# Patient Record
Sex: Female | Born: 1955
Health system: Southern US, Community
[De-identification: ages and names within clinical notes are randomized; demographics above are authoritative.]

## PROBLEM LIST (undated history)

## (undated) DIAGNOSIS — N186 End stage renal disease: Secondary | ICD-10-CM

## (undated) DIAGNOSIS — B1920 Unspecified viral hepatitis C without hepatic coma: Secondary | ICD-10-CM

## (undated) DIAGNOSIS — E119 Type 2 diabetes mellitus without complications: Secondary | ICD-10-CM

## (undated) DIAGNOSIS — I639 Cerebral infarction, unspecified: Secondary | ICD-10-CM

## (undated) DIAGNOSIS — Z992 Dependence on renal dialysis: Secondary | ICD-10-CM

## (undated) DIAGNOSIS — R0902 Hypoxemia: Secondary | ICD-10-CM

## (undated) DIAGNOSIS — I10 Essential (primary) hypertension: Secondary | ICD-10-CM

## (undated) DIAGNOSIS — F1910 Other psychoactive substance abuse, uncomplicated: Secondary | ICD-10-CM

## (undated) HISTORY — DX: Hypoxemia: R09.02

## (undated) HISTORY — DX: Other psychoactive substance abuse, uncomplicated: F19.10

---

## 1998-01-16 ENCOUNTER — Emergency Department (HOSPITAL_COMMUNITY): Admission: EM | Admit: 1998-01-16 | Discharge: 1998-01-16 | Payer: Self-pay | Admitting: Emergency Medicine

## 2000-02-10 ENCOUNTER — Emergency Department (HOSPITAL_COMMUNITY): Admission: EM | Admit: 2000-02-10 | Discharge: 2000-02-10 | Payer: Self-pay | Admitting: Internal Medicine

## 2005-11-30 ENCOUNTER — Emergency Department (HOSPITAL_COMMUNITY): Admission: EM | Admit: 2005-11-30 | Discharge: 2005-11-30 | Payer: Self-pay | Admitting: Emergency Medicine

## 2010-02-19 ENCOUNTER — Inpatient Hospital Stay (HOSPITAL_COMMUNITY): Admission: EM | Admit: 2010-02-19 | Discharge: 2010-02-22 | Payer: Self-pay | Admitting: Emergency Medicine

## 2010-02-19 LAB — CONVERTED CEMR LAB
HDL: 38 mg/dL
Triglycerides: 82 mg/dL

## 2010-02-20 LAB — CONVERTED CEMR LAB: TSH: 4.489 microintl units/mL

## 2010-02-21 LAB — CONVERTED CEMR LAB
BUN: 3 mg/dL
Calcium: 8.3 mg/dL
Glucose, Bld: 129 mg/dL
HIV: NONREACTIVE
MCHC: 32 g/dL
MCV: 74.8 fL
Platelets: 212 10*3/uL
Potassium: 3.5 meq/L
RBC: 4.33 M/uL

## 2010-02-28 ENCOUNTER — Emergency Department (HOSPITAL_COMMUNITY): Admission: EM | Admit: 2010-02-28 | Discharge: 2010-02-28 | Payer: Self-pay | Admitting: Emergency Medicine

## 2010-03-08 ENCOUNTER — Ambulatory Visit: Payer: Self-pay | Admitting: Nurse Practitioner

## 2010-03-08 DIAGNOSIS — J189 Pneumonia, unspecified organism: Secondary | ICD-10-CM

## 2010-03-08 DIAGNOSIS — N390 Urinary tract infection, site not specified: Secondary | ICD-10-CM

## 2010-03-08 DIAGNOSIS — F17200 Nicotine dependence, unspecified, uncomplicated: Secondary | ICD-10-CM

## 2010-03-08 DIAGNOSIS — R030 Elevated blood-pressure reading, without diagnosis of hypertension: Secondary | ICD-10-CM

## 2010-03-08 LAB — CONVERTED CEMR LAB
Bilirubin Urine: NEGATIVE
Nitrite: NEGATIVE
Specific Gravity, Urine: 1.015
pH: 5

## 2010-03-09 ENCOUNTER — Encounter (INDEPENDENT_AMBULATORY_CARE_PROVIDER_SITE_OTHER): Payer: Self-pay | Admitting: Nurse Practitioner

## 2010-10-12 NOTE — Assessment & Plan Note (Signed)
Summary: NEW - Establish care   Vital Signs:  Patient profile:   55 year old female Menstrual status:  postmenopausal Height:      60 inches Weight:      131.5 pounds BMI:     25.77 BSA:     1.56 O2 Sat:      100 % on Room air Temp:     98.0 degrees F oral Pulse rate:   56 / minute Pulse rhythm:   regular Resp:     16 per minute BP sitting:   140 / 91  (left arm) Cuff size:   regular  Vitals Entered By: Isla Pence (March 08, 2010 3:05 PM)  O2 Flow:  Room air CC: follow-up visit [place] Is Patient Diabetic? No Pain Assessment Patient in pain? no       Does patient need assistance? Functional Status Self care Ambulation Normal     Menstrual Status postmenopausal   CC:  follow-up visit [place].  History of Present Illness: Pt into the office the office to establish care. Previously seen at [facility] over 5 years ago. no PCP since that time  PRIMARY DISCHARGE DIAGNOSES:   1. Community her pneumonia - Levaquin given and pt has completed the supply  2. Pyelonephritis - right flank pain and fever along with leukocytosis.  U/A consistent with UTI.  urine cultures greww klebsiella.   For both the above pt was started on rocephin and zithromax     SECONDARY DISCHARGE DIAGNOSES:   1. Tobacco abuse - ongoing and pt was started on nicotine patch in the hospital  2. Polysubstance abuse - last cocaine use over 1 year ago but admits still to some marijuana use     PERTINENT LABORATORY DATA:   1. CBC on admission showed a WBC of 23.6.   2. HIV antibody was nonreactive.   3. Legionella antigen in the urine was negative.   4. Streptococcal pneumoniae urinary antigen was also negative.   5. Blood cultures are negative   6. TSH is 4.489.   7. LDL cholesterol is 75.   8. CBC on discharge shows a WBC of 7.4.   9. Chemistries on discharge show a BUN of 3 and a creatinine of 0.74.   10.Urine culture grew Klebsiella pneumoniae that is pansensitive   except to ampicillin.      PERTINENT RADIOLOGICAL STUDIES:   1. X-ray of the chest done on February 19, 2010, showed focal opacity in       the posterior aspect of the right lung base likely related to       pneumonia.   2. CT of the abdomen and pelvis showed focal airspace disease in the       right lower lobe compatible with pneumonia.   3. Renal ultrasound done on February 21, 2010, showed normal renal size       without hydronephrosis.   4. Ultrasound of the pelvis showed small to moderate amount of free       fluid, not definitely seen on the preceding CT.    Habits & Providers  Alcohol-Tobacco-Diet     Alcohol drinks/day: <1     Tobacco Status: current     Tobacco Counseling: to quit use of tobacco products     Cigarette Packs/Day: 3-4 cigs a day     Year Started: age 51  Exercise-Depression-Behavior     Drug Use: past-cocaine use - quit 2010  Allergies (verified): No Known Drug Allergies  Past History:  Past Surgical History: gallstone removed but gallbladder still present Exploratory laparotomy Tubal ligation  Family History: mother - diabetes, htn, cva (deceased)  Social History: 2 adult children tobacco - 5 cigs per day ETOH - beer occasionally  Drug - marijuana use  quit cocaine use in 2010Smoking Status:  current Packs/Day:  3-4 cigs a day Drug Use:  past-cocaine use - quit 2010  Review of Systems General:  Denies fever. CV:  Denies chest pain or discomfort. Resp:  Complains of cough; denies shortness of breath and wheezing; cough has improved but not as bad as while in the hospital. GI:  Denies abdominal pain, nausea, and vomiting.  Physical Exam  General:  alert.   Head:  thin,short hair Ears:  ear piercing(s) noted.   Lungs:  normal respiratory effort and normal breath sounds.   Heart:  normal rate and regular rhythm.   Msk:  up to the exam table Neurologic:  alert & oriented X3.   Skin:  color normal.   Psych:  Oriented X3.     Impression &  Recommendations:  Problem # 1:  PNEUMONIA (ICD-486) Dx in hospital  o2 sat 100% in office pt has finished levaquin as ordered will order repeat CXRAY as indicated by hospital d/c Orders: Pulse Oximetry (single measurment) (94760) CXR- 2view (CXR)  Problem # 2:  ELEVATED BLOOD PRESSURE WITHOUT DIAGNOSIS OF HYPERTENSION (ICD-796.2) slightly elevated today without previous history of htn will recheck on next visit advised pt to limit sodium intake  Problem # 3:  UTI (ICD-599.0) will recheck urine today Orders: UA Dipstick w/o Micro (manual) (81002) T-Culture, Urine [id number])  Problem # 4:  TOBACCO ABUSE (ICD-305.1) ongoing - advised pt to quit use  Patient Instructions: 1)  Your urine will be sent for culture to be sure infection has cleared. 2)  Get Chest X-ray to be sure pneumonia has resolved.  You do not needs an appointment, just take the order. 3)  Be sure that you get an eligibility appointment so that you can get an orange card to continue to be seen in this office 4)  Follow up in 6-8 weeks for a complete physical exam.   5)  PAP, mammogram appt, tetanus  Laboratory Results   Urine Tests  Date/Time Received: March 08, 2010 3:13 PM   Routine Urinalysis   Color: lt. yellow Appearance: Hazy Glucose: negative   (Normal Range: Negative) Bilirubin: negative   (Normal Range: Negative) Ketone: negative   (Normal Range: Negative) Spec. Gravity: 1.015   (Normal Range: 1.003-1.035) Blood: trace-intact   (Normal Range: Negative) pH: 5.0   (Normal Range: 5.0-8.0) Protein: negative   (Normal Range: Negative) Urobilinogen: 0.2   (Normal Range: 0-1) Nitrite: negative   (Normal Range: Negative) Leukocyte Esterace: small   (Normal Range: Negative)         CXR  Procedure date:  02/21/2010  Findings:      increase in right lower lobe pneumonia  Pelvic US  Procedure date:  02/21/2010  Findings:      small to moderate amount of free fluid, not definitely seen  on the preceeding CT  Renal US  Procedure date:  02/21/2010  Findings:      showed normal renal size without hydronephrosis  CT Abdomen/Pelvis  Procedure date:  02/21/2010  Findings:      focal airspace disease in the right lower lobe compatible with pneumonia   CXR  Procedure date:  02/21/2010  Findings:      increase  in right lower lobe pneumonia  Pelvic US  Procedure date:  02/21/2010  Findings:      small to moderate amount of free fluid, not definitely seen on the preceeding CT  Renal US  Procedure date:  02/21/2010  Findings:      showed normal renal size without hydronephrosis  CT Abdomen/Pelvis  Procedure date:  02/21/2010  Findings:      focal airspace disease in the right lower lobe compatible with pneumonia

## 2010-10-12 NOTE — Letter (Signed)
Summary: PT INFORMATION SHEET  PT INFORMATION SHEET   Imported By: Roland Earl 03/24/2010 15:22:46  _____________________________________________________________________  External Attachment:    Type:   Image     Comment:   External Document

## 2010-11-27 LAB — STREP PNEUMONIAE URINARY ANTIGEN: Strep Pneumo Urinary Antigen: NEGATIVE

## 2010-11-27 LAB — CBC
HCT: 32.4 % — ABNORMAL LOW (ref 36.0–46.0)
HCT: 32.8 % — ABNORMAL LOW (ref 36.0–46.0)
Hemoglobin: 10.5 g/dL — ABNORMAL LOW (ref 12.0–15.0)
Hemoglobin: 12.7 g/dL (ref 12.0–15.0)
MCHC: 32.1 g/dL (ref 30.0–36.0)
MCV: 73.8 fL — ABNORMAL LOW (ref 78.0–100.0)
MCV: 74.6 fL — ABNORMAL LOW (ref 78.0–100.0)
Platelets: 171 10*3/uL (ref 150–400)
Platelets: 212 10*3/uL (ref 150–400)
RDW: 15 % (ref 11.5–15.5)
RDW: 15.5 % (ref 11.5–15.5)
RDW: 15.8 % — ABNORMAL HIGH (ref 11.5–15.5)

## 2010-11-27 LAB — TSH: TSH: 4.489 u[IU]/mL (ref 0.350–4.500)

## 2010-11-27 LAB — BASIC METABOLIC PANEL
BUN: 3 mg/dL — ABNORMAL LOW (ref 6–23)
Calcium: 8.3 mg/dL — ABNORMAL LOW (ref 8.4–10.5)
Creatinine, Ser: 0.74 mg/dL (ref 0.4–1.2)
GFR calc non Af Amer: >60 mL/min (ref >60)
Glucose, Bld: 129 mg/dL — ABNORMAL HIGH (ref 70–99)

## 2010-11-27 LAB — COMPREHENSIVE METABOLIC PANEL
ALT: 51 U/L — ABNORMAL HIGH (ref 0–35)
AST: 23 U/L (ref 0–37)
Albumin: 2.1 g/dL — ABNORMAL LOW (ref 3.5–5.2)
Albumin: 2.8 g/dL — ABNORMAL LOW (ref 3.5–5.2)
BUN: 13 mg/dL (ref 6–23)
BUN: 6 mg/dL (ref 6–23)
CO2: 23 mEq/L (ref 19–32)
Calcium: 7.9 mg/dL — ABNORMAL LOW (ref 8.4–10.5)
Creatinine, Ser: 0.65 mg/dL (ref 0.4–1.2)
GFR calc Af Amer: >60 mL/min (ref >60)
GFR calc non Af Amer: >60 mL/min (ref >60)
Glucose, Bld: 126 mg/dL — ABNORMAL HIGH (ref 70–99)
Potassium: 3 mEq/L — ABNORMAL LOW (ref 3.5–5.1)
Sodium: 138 mEq/L (ref 135–145)
Total Bilirubin: 0.5 mg/dL (ref 0.3–1.2)
Total Bilirubin: 0.8 mg/dL (ref 0.3–1.2)
Total Protein: 6.4 g/dL (ref 6.0–8.3)

## 2010-11-27 LAB — DIFFERENTIAL
Basophils Absolute: 0 10*3/uL (ref 0.0–0.1)
Basophils Absolute: 0 10*3/uL (ref 0.0–0.1)
Basophils Relative: 0 % (ref 0–1)
Eosinophils Relative: 0 % (ref 0–5)
Eosinophils Relative: 1 % (ref 0–5)
Lymphocytes Relative: 16 % (ref 12–46)
Lymphocytes Relative: 6 % — ABNORMAL LOW (ref 12–46)
Lymphs Abs: 1.4 10*3/uL (ref 0.7–4.0)
Lymphs Abs: 1.8 10*3/uL (ref 0.7–4.0)
Monocytes Absolute: 1.2 10*3/uL — ABNORMAL HIGH (ref 0.1–1.0)
Monocytes Relative: 11 % (ref 3–12)
Monocytes Relative: 7 % (ref 3–12)
Neutrophils Relative %: 73 % (ref 43–77)
Neutrophils Relative %: 87 % — ABNORMAL HIGH (ref 43–77)

## 2010-11-27 LAB — URINE CULTURE: Colony Count: >=100000

## 2010-11-27 LAB — URINE MICROSCOPIC-ADD ON

## 2010-11-27 LAB — HEMOGLOBIN A1C
Hgb A1c MFr Bld: 5.9 % — ABNORMAL HIGH (ref <5.7)
Mean Plasma Glucose: 123 mg/dL — ABNORMAL HIGH (ref <117)

## 2010-11-27 LAB — CULTURE, BLOOD (ROUTINE X 2)
Culture: NO GROWTH
Culture: NO GROWTH

## 2010-11-27 LAB — LACTIC ACID, PLASMA: Lactic Acid, Venous: 1.2 mmol/L (ref 0.5–2.2)

## 2010-11-27 LAB — LIPID PANEL: Cholesterol: 129 mg/dL (ref 0–200)

## 2010-11-27 LAB — URINALYSIS, ROUTINE W REFLEX MICROSCOPIC
Bilirubin Urine: NEGATIVE
Glucose, UA: NEGATIVE mg/dL

## 2010-11-27 LAB — LEGIONELLA ANTIGEN, URINE: Legionella Antigen, Urine: NEGATIVE

## 2011-12-09 ENCOUNTER — Encounter (HOSPITAL_COMMUNITY): Payer: Self-pay | Admitting: *Deleted

## 2011-12-09 ENCOUNTER — Emergency Department (HOSPITAL_COMMUNITY)
Admission: EM | Admit: 2011-12-09 | Discharge: 2011-12-10 | Disposition: A | Discharge status: Home / Self Care | Payer: Self-pay | Attending: Emergency Medicine | Admitting: Emergency Medicine

## 2011-12-09 DIAGNOSIS — J111 Influenza due to unidentified influenza virus with other respiratory manifestations: Secondary | ICD-10-CM | POA: Insufficient documentation

## 2011-12-09 DIAGNOSIS — R000 Tachycardia, unspecified: Secondary | ICD-10-CM | POA: Insufficient documentation

## 2011-12-09 DIAGNOSIS — R059 Cough, unspecified: Secondary | ICD-10-CM | POA: Insufficient documentation

## 2011-12-09 DIAGNOSIS — R05 Cough: Secondary | ICD-10-CM | POA: Insufficient documentation

## 2011-12-09 DIAGNOSIS — R231 Pallor: Secondary | ICD-10-CM | POA: Insufficient documentation

## 2011-12-09 NOTE — ED Notes (Signed)
[id number] Expected date:12/09/11<BR> Expected time:10:58 PM<BR> Means of arrival:Ambulance<BR> Comments:<BR> Triage on arrival: PTAR 22 -- Flu Symptoms

## 2011-12-10 ENCOUNTER — Encounter (HOSPITAL_COMMUNITY): Payer: Self-pay | Admitting: *Deleted

## 2011-12-10 ENCOUNTER — Emergency Department (HOSPITAL_COMMUNITY): Payer: Self-pay

## 2011-12-10 MED ORDER — HYDROCOD POLST-CHLORPHEN POLST 10-8 MG/5ML PO LQCR: 5.0000 mL | Freq: Two times a day (BID) | ORAL | Status: DC

## 2011-12-10 MED ORDER — IBUPROFEN 600 MG PO TABS: 600.0000 mg | ORAL_TABLET | Freq: Four times a day (QID) | ORAL | Status: AC | PRN

## 2011-12-10 NOTE — ED Provider Notes (Signed)
Medical screening examination/treatment/procedure(s) were performed by non-physician practitioner and as supervising physician I was immediately available for consultation/collaboration.   Delora Fuel, MD [id number]

## 2011-12-10 NOTE — Discharge Instructions (Signed)
Your chest x-ray reveals no pneumonia.  You've been given a cough preparation to help your symptoms of coughing.  He can take over-the-counter ibuprofen, but she also been given a prescription for 600 mg strength to help with her discomfort

## 2011-12-10 NOTE — ED Provider Notes (Signed)
History     CSN: [id number]  Arrival date & time 12/09/11  2302   First MD Initiated Contact with Patient 12/10/11 0029      Chief Complaint  Patient presents with  . Influenza    family diagnosed with flu, she hasn't felt good for a week has been taking nyquill    (Consider location/radiation/quality/duration/timing/severity/associated sxs/prior treatment) HPI Comments: Patient with flulike symptoms for the past week, now with increasing cough, left lower chest pain, posteriorly, with coughing.  Her entire family has been diagnosed with flu.  She been taking over-the-counter TheraFlu and NyQuil for her symptoms  Patient is a 56 y.o. female presenting with flu symptoms. The history is provided by the patient.  Influenza This is a new problem. The current episode started in the past 7 days. The problem has been unchanged. Associated symptoms include chest pain, coughing and a fever. Pertinent negatives include no headaches. The symptoms are aggravated by coughing. She has tried NSAIDs for the symptoms. The treatment provided no relief.    History reviewed. No pertinent past medical history.  History reviewed. No pertinent past surgical history.  History reviewed. No pertinent family history.  History  Substance Use Topics  . Smoking status: Not on file  . Smokeless tobacco: Not on file  . Alcohol Use: Yes    OB History    Grav Para Term Preterm Abortions TAB SAB Ect Mult Living                  Review of Systems  Constitutional: Positive for fever.  Respiratory: Positive for cough. Negative for shortness of breath.   Cardiovascular: Positive for chest pain.  Neurological: Negative for dizziness and headaches.    Allergies  Review of patient's allergies indicates no known allergies.  Home Medications   Current Outpatient Rx  Name Route Sig Dispense Refill  . HYDROCOD POLST-CPM POLST ER 10-8 MG/5ML PO LQCR Oral Take 5 mLs by mouth every 12 (twelve) hours. 140 mL 0   . IBUPROFEN 600 MG PO TABS Oral Take 1 tablet (600 mg total) by mouth every 6 (six) hours as needed for pain. 30 tablet 0    BP 113/73  Pulse 102  Temp 99.9 F (37.7 C)  Resp 18  SpO2 99%  Physical Exam  Constitutional: She is oriented to person, place, and time. She appears well-developed and well-nourished.  Neck: Normal range of motion.  Cardiovascular: Tachycardia present.   Pulmonary/Chest: No respiratory distress. She has no wheezes. She has rales. She exhibits tenderness.  Abdominal: Soft. She exhibits no distension. There is no tenderness.  Musculoskeletal: Normal range of motion.  Neurological: She is alert and oriented to person, place, and time.  Skin: Skin is warm. There is pallor.    ED Course  Procedures (including critical care time)  Labs Reviewed - No data to display Dg Chest 2 View  12/10/2011  *RADIOLOGY REPORT*  Clinical Data: Cough, congestion and fever.  CHEST - 2 VIEW  Comparison: 02/28/2010.  Findings: The cardiac silhouette, mediastinal and hilar contours are within normal limits and stable.  The lungs are clear.  No pleural effusion.  The bony thorax is intact.  IMPRESSION: No acute cardiopulmonary findings.  Original Report Authenticated By: P. Kalman Jewels, M.D.     1. Influenza   2. Cough productive of clear sputum       MDM  Will xray to RO pneumonia        Garald Balding, NP 12/10/11 [id number]

## 2018-02-12 ENCOUNTER — Emergency Department (HOSPITAL_COMMUNITY): Payer: Medicaid Other

## 2018-02-12 ENCOUNTER — Encounter (HOSPITAL_COMMUNITY): Payer: Self-pay | Admitting: Emergency Medicine

## 2018-02-12 ENCOUNTER — Inpatient Hospital Stay (HOSPITAL_COMMUNITY)
Admission: EM | Admit: 2018-02-12 | Discharge: 2018-03-15 | DRG: 853 | Disposition: A | Discharge status: Home Health | Payer: Medicaid Other | Attending: Internal Medicine | Admitting: Internal Medicine

## 2018-02-12 DIAGNOSIS — R509 Fever, unspecified: Secondary | ICD-10-CM

## 2018-02-12 DIAGNOSIS — D631 Anemia in chronic kidney disease: Secondary | ICD-10-CM | POA: Diagnosis present

## 2018-02-12 DIAGNOSIS — Z992 Dependence on renal dialysis: Secondary | ICD-10-CM

## 2018-02-12 DIAGNOSIS — L7632 Postprocedural hematoma of skin and subcutaneous tissue following other procedure: Secondary | ICD-10-CM | POA: Diagnosis not present

## 2018-02-12 DIAGNOSIS — G9341 Metabolic encephalopathy: Secondary | ICD-10-CM | POA: Diagnosis not present

## 2018-02-12 DIAGNOSIS — F17210 Nicotine dependence, cigarettes, uncomplicated: Secondary | ICD-10-CM | POA: Diagnosis present

## 2018-02-12 DIAGNOSIS — A409 Streptococcal sepsis, unspecified: Secondary | ICD-10-CM | POA: Diagnosis present

## 2018-02-12 DIAGNOSIS — R6520 Severe sepsis without septic shock: Secondary | ICD-10-CM | POA: Diagnosis present

## 2018-02-12 DIAGNOSIS — N390 Urinary tract infection, site not specified: Secondary | ICD-10-CM | POA: Diagnosis present

## 2018-02-12 DIAGNOSIS — F1010 Alcohol abuse, uncomplicated: Secondary | ICD-10-CM | POA: Diagnosis present

## 2018-02-12 DIAGNOSIS — D509 Iron deficiency anemia, unspecified: Secondary | ICD-10-CM | POA: Diagnosis not present

## 2018-02-12 DIAGNOSIS — E11649 Type 2 diabetes mellitus with hypoglycemia without coma: Secondary | ICD-10-CM | POA: Diagnosis not present

## 2018-02-12 DIAGNOSIS — N184 Chronic kidney disease, stage 4 (severe): Secondary | ICD-10-CM | POA: Diagnosis not present

## 2018-02-12 DIAGNOSIS — J690 Pneumonitis due to inhalation of food and vomit: Secondary | ICD-10-CM | POA: Diagnosis present

## 2018-02-12 DIAGNOSIS — R0600 Dyspnea, unspecified: Secondary | ICD-10-CM | POA: Diagnosis not present

## 2018-02-12 DIAGNOSIS — J84116 Cryptogenic organizing pneumonia: Secondary | ICD-10-CM | POA: Diagnosis present

## 2018-02-12 DIAGNOSIS — E875 Hyperkalemia: Secondary | ICD-10-CM | POA: Diagnosis present

## 2018-02-12 DIAGNOSIS — N179 Acute kidney failure, unspecified: Secondary | ICD-10-CM | POA: Diagnosis present

## 2018-02-12 DIAGNOSIS — E1165 Type 2 diabetes mellitus with hyperglycemia: Secondary | ICD-10-CM | POA: Diagnosis present

## 2018-02-12 DIAGNOSIS — I160 Hypertensive urgency: Secondary | ICD-10-CM | POA: Diagnosis present

## 2018-02-12 DIAGNOSIS — I288 Other diseases of pulmonary vessels: Secondary | ICD-10-CM

## 2018-02-12 DIAGNOSIS — R030 Elevated blood-pressure reading, without diagnosis of hypertension: Secondary | ICD-10-CM | POA: Diagnosis not present

## 2018-02-12 DIAGNOSIS — J9601 Acute respiratory failure with hypoxia: Secondary | ICD-10-CM | POA: Diagnosis present

## 2018-02-12 DIAGNOSIS — R0902 Hypoxemia: Secondary | ICD-10-CM

## 2018-02-12 DIAGNOSIS — N019 Rapidly progressive nephritic syndrome with unspecified morphologic changes: Secondary | ICD-10-CM | POA: Diagnosis present

## 2018-02-12 DIAGNOSIS — T380X5A Adverse effect of glucocorticoids and synthetic analogues, initial encounter: Secondary | ICD-10-CM | POA: Diagnosis not present

## 2018-02-12 DIAGNOSIS — I776 Arteritis, unspecified: Secondary | ICD-10-CM | POA: Diagnosis not present

## 2018-02-12 DIAGNOSIS — E872 Acidosis: Secondary | ICD-10-CM | POA: Diagnosis present

## 2018-02-12 DIAGNOSIS — R042 Hemoptysis: Secondary | ICD-10-CM | POA: Diagnosis not present

## 2018-02-12 DIAGNOSIS — R5081 Fever presenting with conditions classified elsewhere: Secondary | ICD-10-CM | POA: Diagnosis not present

## 2018-02-12 DIAGNOSIS — D61818 Other pancytopenia: Secondary | ICD-10-CM | POA: Diagnosis not present

## 2018-02-12 DIAGNOSIS — N19 Unspecified kidney failure: Secondary | ICD-10-CM

## 2018-02-12 DIAGNOSIS — E1122 Type 2 diabetes mellitus with diabetic chronic kidney disease: Secondary | ICD-10-CM | POA: Diagnosis present

## 2018-02-12 DIAGNOSIS — R918 Other nonspecific abnormal finding of lung field: Secondary | ICD-10-CM

## 2018-02-12 DIAGNOSIS — J189 Pneumonia, unspecified organism: Secondary | ICD-10-CM | POA: Diagnosis not present

## 2018-02-12 DIAGNOSIS — B1920 Unspecified viral hepatitis C without hepatic coma: Secondary | ICD-10-CM | POA: Diagnosis present

## 2018-02-12 DIAGNOSIS — Z794 Long term (current) use of insulin: Secondary | ICD-10-CM

## 2018-02-12 DIAGNOSIS — E878 Other disorders of electrolyte and fluid balance, not elsewhere classified: Secondary | ICD-10-CM | POA: Diagnosis present

## 2018-02-12 DIAGNOSIS — J8489 Other specified interstitial pulmonary diseases: Secondary | ICD-10-CM | POA: Diagnosis not present

## 2018-02-12 DIAGNOSIS — F1410 Cocaine abuse, uncomplicated: Secondary | ICD-10-CM | POA: Diagnosis present

## 2018-02-12 DIAGNOSIS — R0489 Hemorrhage from other sites in respiratory passages: Secondary | ICD-10-CM

## 2018-02-12 DIAGNOSIS — N186 End stage renal disease: Secondary | ICD-10-CM | POA: Diagnosis present

## 2018-02-12 DIAGNOSIS — J9600 Acute respiratory failure, unspecified whether with hypoxia or hypercapnia: Secondary | ICD-10-CM

## 2018-02-12 DIAGNOSIS — Y832 Surgical operation with anastomosis, bypass or graft as the cause of abnormal reaction of the patient, or of later complication, without mention of misadventure at the time of the procedure: Secondary | ICD-10-CM | POA: Diagnosis not present

## 2018-02-12 DIAGNOSIS — R1013 Epigastric pain: Secondary | ICD-10-CM | POA: Diagnosis not present

## 2018-02-12 DIAGNOSIS — D649 Anemia, unspecified: Secondary | ICD-10-CM

## 2018-02-12 DIAGNOSIS — K2920 Alcoholic gastritis without bleeding: Secondary | ICD-10-CM | POA: Diagnosis present

## 2018-02-12 DIAGNOSIS — Z01810 Encounter for preprocedural cardiovascular examination: Secondary | ICD-10-CM | POA: Diagnosis not present

## 2018-02-12 DIAGNOSIS — Z978 Presence of other specified devices: Secondary | ICD-10-CM | POA: Diagnosis present

## 2018-02-12 DIAGNOSIS — I120 Hypertensive chronic kidney disease with stage 5 chronic kidney disease or end stage renal disease: Secondary | ICD-10-CM | POA: Diagnosis present

## 2018-02-12 LAB — COMPREHENSIVE METABOLIC PANEL
ALT: 11 U/L — ABNORMAL LOW (ref 14–54)
ANION GAP: 10 (ref 5–15)
AST: 21 U/L (ref 15–41)
Albumin: 2.3 g/dL — ABNORMAL LOW (ref 3.5–5.0)
Alkaline Phosphatase: 53 U/L (ref 38–126)
BUN: 61 mg/dL — ABNORMAL HIGH (ref 6–20)
CHLORIDE: 106 mmol/L (ref 101–111)
CO2: 19 mmol/L — AB (ref 22–32)
Calcium: 7.7 mg/dL — ABNORMAL LOW (ref 8.9–10.3)
Creatinine, Ser: 7.58 mg/dL — ABNORMAL HIGH (ref 0.44–1.00)
GFR calc non Af Amer: 5 mL/min — ABNORMAL LOW (ref >60)
GFR, EST AFRICAN AMERICAN: 6 mL/min — AB (ref >60)
Glucose, Bld: 134 mg/dL — ABNORMAL HIGH (ref 65–99)
POTASSIUM: 4.8 mmol/L (ref 3.5–5.1)
SODIUM: 135 mmol/L (ref 135–145)
Total Bilirubin: 0.9 mg/dL (ref 0.3–1.2)
Total Protein: 7.7 g/dL (ref 6.5–8.1)

## 2018-02-12 LAB — RAPID URINE DRUG SCREEN, HOSP PERFORMED
AMPHETAMINES: NOT DETECTED
BARBITURATES: NOT DETECTED
BENZODIAZEPINES: NOT DETECTED
Cocaine: POSITIVE — AB
Opiates: NOT DETECTED
Tetrahydrocannabinol: NOT DETECTED

## 2018-02-12 LAB — CBC
HEMATOCRIT: 23.4 % — AB (ref 36.0–46.0)
Hemoglobin: 7.3 g/dL — ABNORMAL LOW (ref 12.0–15.0)
MCH: 22.3 pg — AB (ref 26.0–34.0)
MCHC: 31.2 g/dL (ref 30.0–36.0)
MCV: 71.6 fL — AB (ref 78.0–100.0)
Platelets: 306 10*3/uL (ref 150–400)
RBC: 3.27 MIL/uL — AB (ref 3.87–5.11)
RDW: 16.5 % — ABNORMAL HIGH (ref 11.5–15.5)
WBC: 12 10*3/uL — ABNORMAL HIGH (ref 4.0–10.5)

## 2018-02-12 LAB — URINALYSIS, ROUTINE W REFLEX MICROSCOPIC
Bilirubin Urine: NEGATIVE
Glucose, UA: NEGATIVE mg/dL
Ketones, ur: NEGATIVE mg/dL
Leukocytes, UA: NEGATIVE
Nitrite: NEGATIVE
Protein, ur: 100 mg/dL — AB
RBC / HPF: >50 RBC/hpf — ABNORMAL HIGH (ref 0–5)
SPECIFIC GRAVITY, URINE: 1.013 (ref 1.005–1.030)
pH: 5 (ref 5.0–8.0)

## 2018-02-12 LAB — ETHANOL

## 2018-02-12 LAB — PROTIME-INR
INR: 1.07
Prothrombin Time: 13.9 seconds (ref 11.4–15.2)

## 2018-02-12 LAB — LIPASE, BLOOD: LIPASE: 40 U/L (ref 11–51)

## 2018-02-12 LAB — POC OCCULT BLOOD, ED: Fecal Occult Bld: NEGATIVE

## 2018-02-12 LAB — ABO/RH: ABO/RH(D): A POS

## 2018-02-12 LAB — I-STAT CG4 LACTIC ACID, ED: LACTIC ACID, VENOUS: 0.76 mmol/L (ref 0.5–1.9)

## 2018-02-12 LAB — BRAIN NATRIURETIC PEPTIDE: B Natriuretic Peptide: 225.7 pg/mL — ABNORMAL HIGH (ref 0.0–100.0)

## 2018-02-12 LAB — TROPONIN I: Troponin I: <0.03 ng/mL (ref <0.03)

## 2018-02-12 LAB — ACETAMINOPHEN LEVEL: Acetaminophen (Tylenol), Serum: <10 ug/mL — ABNORMAL LOW (ref 10–30)

## 2018-02-12 MED ORDER — PANTOPRAZOLE SODIUM 40 MG IV SOLR
40.0000 mg | Freq: Once | INTRAVENOUS | Status: AC
  Administered 2018-02-12: 40 mg via INTRAVENOUS
  Filled 2018-02-12 (×2): qty 40

## 2018-02-12 MED ORDER — ONDANSETRON HCL 4 MG/2ML IJ SOLN
4.0000 mg | Freq: Once | INTRAMUSCULAR | Status: AC
  Administered 2018-02-12: 4 mg via INTRAVENOUS
  Filled 2018-02-12: qty 2

## 2018-02-12 MED ORDER — SODIUM CHLORIDE 0.9 % IV SOLN
2.0000 g | INTRAVENOUS | Status: DC
  Administered 2018-02-12: 2 g via INTRAVENOUS
  Filled 2018-02-12: qty 20

## 2018-02-12 MED ORDER — LORAZEPAM 2 MG/ML IJ SOLN
1.0000 mg | Freq: Four times a day (QID) | INTRAMUSCULAR | Status: AC | PRN
  Administered 2018-02-12 – 2018-02-15 (×3): 1 mg via INTRAVENOUS
  Filled 2018-02-12 (×3): qty 1

## 2018-02-12 MED ORDER — OXYCODONE-ACETAMINOPHEN 5-325 MG PO TABS
1.0000 | ORAL_TABLET | Freq: Once | ORAL | Status: DC
  Filled 2018-02-12: qty 1

## 2018-02-12 MED ORDER — SODIUM CHLORIDE 0.9 % IV BOLUS
500.0000 mL | Freq: Once | INTRAVENOUS | Status: AC
  Administered 2018-02-12: 500 mL via INTRAVENOUS

## 2018-02-12 MED ORDER — ONDANSETRON HCL 4 MG/2ML IJ SOLN
4.0000 mg | Freq: Four times a day (QID) | INTRAMUSCULAR | Status: DC | PRN
  Administered 2018-02-12 – 2018-03-03 (×3): 4 mg via INTRAVENOUS
  Filled 2018-02-12 (×3): qty 2

## 2018-02-12 MED ORDER — SODIUM CHLORIDE 0.9 % IV SOLN
INTRAVENOUS | Status: AC
  Administered 2018-02-12: via INTRAVENOUS

## 2018-02-12 MED ORDER — THIAMINE HCL 100 MG/ML IJ SOLN
100.0000 mg | Freq: Every day | INTRAMUSCULAR | Status: DC
  Administered 2018-02-12: 100 mg via INTRAVENOUS
  Filled 2018-02-12 (×2): qty 2

## 2018-02-12 MED ORDER — AZITHROMYCIN 500 MG IV SOLR
500.0000 mg | INTRAVENOUS | Status: DC
  Administered 2018-02-13: 500 mg via INTRAVENOUS
  Filled 2018-02-12: qty 500

## 2018-02-12 MED ORDER — ACETAMINOPHEN 325 MG PO TABS: 650.0000 mg | ORAL_TABLET | Freq: Four times a day (QID) | ORAL | Status: DC | PRN

## 2018-02-12 MED ORDER — HYDRALAZINE HCL 20 MG/ML IJ SOLN
10.0000 mg | INTRAMUSCULAR | Status: DC | PRN
  Administered 2018-02-15 – 2018-02-18 (×3): 10 mg via INTRAVENOUS
  Filled 2018-02-12 (×3): qty 1

## 2018-02-12 MED ORDER — ADULT MULTIVITAMIN W/MINERALS CH
1.0000 | ORAL_TABLET | Freq: Every day | ORAL | Status: DC
  Administered 2018-02-13 – 2018-03-14 (×26): 1 via ORAL
  Filled 2018-02-12 (×27): qty 1

## 2018-02-12 MED ORDER — LORAZEPAM 1 MG PO TABS: 1.0000 mg | ORAL_TABLET | Freq: Four times a day (QID) | ORAL | Status: AC | PRN

## 2018-02-12 MED ORDER — HYDROMORPHONE HCL 2 MG/ML IJ SOLN
1.0000 mg | INTRAMUSCULAR | Status: DC | PRN
  Administered 2018-02-12: 1 mg via INTRAVENOUS
  Filled 2018-02-12: qty 1

## 2018-02-12 MED ORDER — ONDANSETRON 4 MG PO TBDP
4.0000 mg | ORAL_TABLET | Freq: Once | ORAL | Status: AC | PRN
  Administered 2018-02-12: 4 mg via ORAL
  Filled 2018-02-12: qty 1

## 2018-02-12 MED ORDER — PANTOPRAZOLE SODIUM 40 MG PO TBEC
40.0000 mg | DELAYED_RELEASE_TABLET | Freq: Every day | ORAL | Status: DC
  Administered 2018-02-13: 40 mg via ORAL
  Filled 2018-02-12: qty 1

## 2018-02-12 MED ORDER — FOLIC ACID 1 MG PO TABS
1.0000 mg | ORAL_TABLET | Freq: Every day | ORAL | Status: DC
  Administered 2018-02-13 – 2018-02-16 (×4): 1 mg via ORAL
  Filled 2018-02-12 (×5): qty 1

## 2018-02-12 MED ORDER — SODIUM CHLORIDE 0.9% FLUSH
3.0000 mL | Freq: Two times a day (BID) | INTRAVENOUS | Status: DC
  Administered 2018-02-12 – 2018-03-14 (×52): 3 mL via INTRAVENOUS

## 2018-02-12 MED ORDER — HEPARIN SODIUM (PORCINE) 5000 UNIT/ML IJ SOLN
5000.0000 [IU] | Freq: Three times a day (TID) | INTRAMUSCULAR | Status: AC
  Administered 2018-02-12 – 2018-02-16 (×13): 5000 [IU] via SUBCUTANEOUS
  Filled 2018-02-12 (×13): qty 1

## 2018-02-12 MED ORDER — SENNOSIDES-DOCUSATE SODIUM 8.6-50 MG PO TABS
1.0000 | ORAL_TABLET | Freq: Every evening | ORAL | Status: DC | PRN
  Filled 2018-02-12: qty 1

## 2018-02-12 MED ORDER — LORAZEPAM 1 MG PO TABS
0.0000 mg | ORAL_TABLET | Freq: Two times a day (BID) | ORAL | Status: AC
  Administered 2018-02-14: 1 mg via ORAL
  Filled 2018-02-12: qty 1

## 2018-02-12 MED ORDER — LORAZEPAM 1 MG PO TABS
0.0000 mg | ORAL_TABLET | Freq: Four times a day (QID) | ORAL | Status: AC
  Administered 2018-02-13: 1 mg via ORAL
  Administered 2018-02-13: 2 mg via ORAL
  Filled 2018-02-12: qty 2
  Filled 2018-02-12: qty 1

## 2018-02-12 MED ORDER — ACETAMINOPHEN 650 MG RE SUPP: 650.0000 mg | Freq: Four times a day (QID) | RECTAL | Status: DC | PRN

## 2018-02-12 MED ORDER — SODIUM CHLORIDE 0.9 % IV SOLN
500.0000 mg | INTRAVENOUS | Status: DC
  Administered 2018-02-12: 500 mg via INTRAVENOUS
  Filled 2018-02-12: qty 500

## 2018-02-12 MED ORDER — HYDROMORPHONE HCL 2 MG/ML IJ SOLN
1.0000 mg | Freq: Once | INTRAMUSCULAR | Status: AC
  Administered 2018-02-12: 1 mg via INTRAVENOUS
  Filled 2018-02-12: qty 1

## 2018-02-12 MED ORDER — ONDANSETRON HCL 4 MG PO TABS
4.0000 mg | ORAL_TABLET | Freq: Four times a day (QID) | ORAL | Status: DC | PRN
  Administered 2018-03-04: 4 mg via ORAL
  Filled 2018-02-12: qty 1

## 2018-02-12 MED ORDER — HYDROCODONE-ACETAMINOPHEN 5-325 MG PO TABS
1.0000 | ORAL_TABLET | ORAL | Status: DC | PRN
  Administered 2018-02-14 – 2018-02-15 (×2): 2 via ORAL
  Administered 2018-02-16 – 2018-02-17 (×4): 1 via ORAL
  Administered 2018-02-18 (×2): 2 via ORAL
  Administered 2018-02-18 – 2018-03-06 (×6): 1 via ORAL
  Administered 2018-03-07 (×2): 2 via ORAL
  Administered 2018-03-15: 1 via ORAL
  Filled 2018-02-12: qty 2
  Filled 2018-02-12: qty 1
  Filled 2018-02-12 (×2): qty 2
  Filled 2018-02-12 (×5): qty 1
  Filled 2018-02-12: qty 2
  Filled 2018-02-12: qty 1
  Filled 2018-02-12: qty 2
  Filled 2018-02-12 (×3): qty 1
  Filled 2018-02-12: qty 2
  Filled 2018-02-12 (×3): qty 1
  Filled 2018-02-12 (×2): qty 2
  Filled 2018-02-12: qty 1

## 2018-02-12 MED ORDER — VITAMIN B-1 100 MG PO TABS
100.0000 mg | ORAL_TABLET | Freq: Every day | ORAL | Status: DC
  Administered 2018-02-13 – 2018-03-14 (×26): 100 mg via ORAL
  Filled 2018-02-12 (×26): qty 1

## 2018-02-12 MED ORDER — CEFTRIAXONE SODIUM 1 G IJ SOLR
1.0000 g | INTRAMUSCULAR | Status: AC
  Administered 2018-02-13 – 2018-02-18 (×6): 1 g via INTRAVENOUS
  Filled 2018-02-12 (×6): qty 10

## 2018-02-12 NOTE — ED Notes (Signed)
Radiology at bedside for CXR

## 2018-02-12 NOTE — ED Triage Notes (Signed)
Per EMS- from home c.o. Pain to abdomen for 1 week. Numerous liquor bottles noted in house. Nausea, with mucous production.

## 2018-02-12 NOTE — ED Provider Notes (Signed)
[facility] EMERGENCY DEPARTMENT Provider Note   CSN: [id number] Arrival date & time: 02/12/18  1522     History   Chief Complaint Chief Complaint  Patient presents with  . Abdominal Pain    HPI Rhonda Lynch is a 62 y.o. female.  HPI  63 year old female presents with a chief complaint of abdominal pain.  She states she is been having on and off epigastric abdominal pain for about 3 weeks.  She is also had vomiting, occasionally with small amounts of blood.  She has noticed some dark stools as well.  She drinks alcohol, she estimates about 3 times per week.  She denies alcohol use today.  The history is somewhat limited due to the patient's severe pain.  She has also been having cough and shortness of breath but denies chest pain.  She denies any fevers.  History reviewed. No pertinent past medical history.  Patient Active Problem List   Diagnosis Date Noted  . TOBACCO ABUSE 03/08/2010  . PNEUMONIA 03/08/2010  . UTI 03/08/2010  . ELEVATED BLOOD PRESSURE WITHOUT DIAGNOSIS OF HYPERTENSION 03/08/2010    No past surgical history on file.   OB History   None      Home Medications    Prior to Admission medications   Medication Sig Start Date End Date Taking? Authorizing Provider  chlorpheniramine-HYDROcodone (TUSSIONEX PENNKINETIC ER) 10-8 MG/5ML LQCR Take 5 mLs by mouth every 12 (twelve) hours. 12/10/11   Junius Creamer, NP    Family History No family history on file.  Social History Social History   Tobacco Use  . Smoking status: Not on file  Substance Use Topics  . Alcohol use: Yes  . Drug use: No     Allergies   Patient has no known allergies.   Review of Systems Review of Systems  Constitutional: Negative for fever.  Respiratory: Positive for cough and shortness of breath.   Cardiovascular: Negative for chest pain.  Gastrointestinal: Positive for abdominal pain and vomiting.  Neurological: Positive for headaches (on and off for 2-3  weeks).  All other systems reviewed and are negative.    Physical Exam Updated Vital Signs BP (!) 171/93 (BP Location: Right Arm)   Pulse 85   Temp 99.6 F (37.6 C) (Oral)   Resp 16   SpO2 95%   Physical Exam  Constitutional: She is oriented to person, place, and time. She appears well-developed and well-nourished. She appears distressed.  HENT:  Head: Normocephalic and atraumatic.  Right Ear: External ear normal.  Left Ear: External ear normal.  Nose: Nose normal.  Eyes: Right eye exhibits no discharge. Left eye exhibits no discharge.  Cardiovascular: Regular rhythm and normal heart sounds. Tachycardia present.  Pulmonary/Chest: No accessory muscle usage. Tachypnea noted.  Difficult lung auscultation given the tachypnea but also frequent cough when trying to inspire.  However she seems to have lung sounds bilaterally but they are coarse  Abdominal: Soft. There is tenderness in the epigastric area.  Genitourinary:  Genitourinary Comments: Light brown stool on rectal exam  Neurological: She is alert and oriented to person, place, and time.  Skin: Skin is warm and dry.  Nursing note and vitals reviewed.    ED Treatments / Results  Labs (all labs ordered are listed, but only abnormal results are displayed) Labs Reviewed  COMPREHENSIVE METABOLIC PANEL - Abnormal; Notable for the following components:      Result Value   CO2 19 (*)    Glucose, Bld 134 (*)  BUN 61 (*)    Creatinine, Ser 7.58 (*)    Calcium 7.7 (*)    Albumin 2.3 (*)    ALT 11 (*)    GFR calc non Af Amer 5 (*)    GFR calc Af Amer 6 (*)    All other components within normal limits  CBC - Abnormal; Notable for the following components:   WBC 12.0 (*)    RBC 3.27 (*)    Hemoglobin 7.3 (*)    HCT 23.4 (*)    MCV 71.6 (*)    MCH 22.3 (*)    RDW 16.5 (*)    All other components within normal limits  LIPASE, BLOOD  URINALYSIS, ROUTINE W REFLEX MICROSCOPIC  ETHANOL  ACETAMINOPHEN LEVEL  RAPID URINE  DRUG SCREEN, HOSP PERFORMED  PROTIME-INR  POC OCCULT BLOOD, ED  TYPE AND SCREEN    EKG EKG Interpretation  Date/Time:  Wednesday February 12 2018 15:34:39 EDT Ventricular Rate:  93 PR Interval:  120 QRS Duration: 66 QT Interval:  362 QTC Calculation: 450 R Axis:   -19 Text Interpretation:  Normal sinus rhythm Minimal voltage criteria for LVH, may be normal variant Borderline ECG No old tracing to compare Confirmed by Sherwood Gambler [phone]) on 02/12/2018 5:48:19 PM   Radiology Ct Abdomen Pelvis Wo Contrast  Result Date: 02/12/2018 CLINICAL DATA:  Epigastric abdominal pain for the past week. EXAM: CT ABDOMEN AND PELVIS WITHOUT CONTRAST TECHNIQUE: Multidetector CT imaging of the abdomen and pelvis was performed following the standard protocol without IV contrast. COMPARISON:  CT abdomen pelvis dated February 19, 2010. FINDINGS: Lower chest: Confluent ground-glass density at the right greater than left lung bases with inter and intralobular septal thickening, consistent with crazy paving pattern. This spares the subpleural lung. Hepatobiliary: No focal liver abnormality is seen. Status post cholecystectomy. Mild central intrahepatic and common bile duct dilatation, likely related to post cholecystectomy state. Pancreas: Unremarkable. No pancreatic ductal dilatation or surrounding inflammatory changes. Spleen: Normal in size without focal abnormality. Adrenals/Urinary Tract: Adrenal glands are unremarkable. Kidneys are normal, without renal calculi, focal lesion, or hydronephrosis. Bladder is unremarkable. Stomach/Bowel: Stomach is within normal limits. Appendix appears normal. No evidence of bowel wall thickening, distention, or inflammatory changes. Vascular/Lymphatic: Aortic atherosclerosis. No enlarged abdominal or pelvic lymph nodes. Reproductive: Prior hysterectomy. 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 Other: Tiny fat containing umbilical hernia. No free fluid or pneumoperitoneum. Musculoskeletal:  No acute or significant osseous findings. 3 mm anterolisthesis at L4-L5 due to severe facet arthropathy. Which IMPRESSION: 1.  No acute intra-abdominal process. 2. Confluent ground-glass density and inter and intralobular septal thickening at the right greater than left lung bases, consistent with crazy paving pattern. While this pattern is nonspecific, given the sparing of the subpleural lung, this may be related to cryptogenic organizing pneumonia. Pulmonology consultation is recommended. 3. 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 Electronically Signed   By: Titus Dubin M.D.   On: 02/12/2018 20:01   Dg Chest Port 1 View  Result Date: 02/12/2018 CLINICAL DATA:  Hypoxia. EXAM: PORTABLE CHEST 1 VIEW COMPARISON:  Chest x-ray dated December 10, 2011. FINDINGS: The heart size and mediastinal contours are within normal limits. Normal pulmonary vascularity. Confluent airspace disease involving the right central lung and left perihilar lung. No pleural effusion or pneumothorax. No acute osseous abnormality. IMPRESSION: 1. Confluent airspace disease in the right greater than left lungs. Electronically Signed   By: Titus Dubin M.D.   On: 02/12/2018 18:53  Procedures .Critical Care Performed by: Sherwood Gambler, MD Authorized by: Sherwood Gambler, MD   Critical care provider statement:    Critical care time (minutes):  40   Critical care time was exclusive of:  Separately billable procedures and treating other patients   Critical care was necessary to treat or prevent imminent or life-threatening deterioration of the following conditions:  Circulatory failure, respiratory failure, shock and sepsis   Critical care was time spent personally by me on the following activities:  Development of treatment plan with patient or surrogate, discussions with consultants, evaluation of patient's response to treatment, examination of patient, obtaining history from patient or surrogate, ordering and performing  treatments and interventions, ordering and review of laboratory studies, ordering and review of radiographic studies, pulse oximetry, re-evaluation of patient's condition and review of old charts   (including critical care time)  Medications Ordered in ED Medications  oxyCODONE-acetaminophen (PERCOCET/ROXICET) 5-325 MG per tablet 1 tablet (has no administration in time range)  pantoprazole (PROTONIX) injection 40 mg (has no administration in time range)  ondansetron (ZOFRAN) injection 4 mg (has no administration in time range)  HYDROmorphone (DILAUDID) injection 1 mg (has no administration in time range)  ondansetron (ZOFRAN-ODT) disintegrating tablet 4 mg (4 mg Oral Given 02/12/18 1556)     Initial Impression / Assessment and Plan / ED Course  I have reviewed the triage vital signs and the nursing notes.  Pertinent labs & imaging results that were available during my care of the patient were reviewed by me and considered in my medical decision making (see chart for details).     Patient presents with concern of abdominal pain but is noted to be quite hypoxic to 83% on room air.  This came up with supplemental oxygen.  Exam initially quite limited due to her pain but after IV Dilaudid she is resting much more comfortably and tachypnea is better.  She is hypertensive rather than hypotensive.  I do not think she is in shock but she has some concerning lab work including significant renal failure with a creatinine of 7 and anemia with a hemoglobin of 7.  No recent lab work within the last 8 years.  However I suspect this is at least acute or subacute.  I did do a bedside ultrasound which shows a collapsible IVC and so she will be given a little bit of fluids.  Her x-ray is concerning for multifocal pneumonia as well as her CT.  She will be given antibiotics.  Discussed with hospitalist service who will admit.  Final Clinical Impressions(s) / ED Diagnoses   Final diagnoses:  Hypoxia  Pneumonia  of both lungs due to infectious organism, unspecified part of lung  Acute kidney injury ([place])  Anemia, unspecified type    ED Discharge Orders    None       Sherwood Gambler, MD 02/13/18 0003

## 2018-02-12 NOTE — ED Provider Notes (Signed)
Patient placed in Quick Look pathway, seen and evaluated   Chief Complaint: Abdominal pain   HPI:   Rhonda Lynch is a 62 y.o. female who presents by EMS from home with a epigastric abdominal pain for 1 week. EMS reports the patient was writhing around in the bed. 190/148 BP automatic. Pain for the last week. Neighbor checked  A lot of liquor bottles everywhere. Hx of bronchitis. Intermittent nausea.   The patient is writhing around in the bed so history is limited.  The patient denies alcohol use today or yesterday.  She states " I had just a touch 2 days ago."  She denies a history of ICU admissions, seizures, or DTs.  ROS: Abdominal pain, nausea  Physical Exam:   Gen: No distress  Neuro: Awake and Alert  Skin: Warm    Focused Exam: Patient is uncomfortable appearing.  She is writhing around in the chair and holding her upper abdomen.  Tender to palpation in the epigastric region.  No CVA tenderness bilaterally.  Abdomen is soft, nondistended.  No peritoneal signs.  Initiation of care has begun. The patient has been counseled on the process, plan, and necessity for staying for the completion/evaluation, and the remainder of the medical screening examination    Joanne Gavel, PA-C 02/12/18 1608    Elnora Morrison, MD 02/12/18 [phone]

## 2018-02-12 NOTE — H&P (Signed)
History and Physical    AIKA BRZOSKA HEN:[id number] DOB: 06-Oct-1955 DOA: 02/12/2018  PCP: Patient, No Pcp Per   Patient coming from: Home  Chief Complaint: Epigastric pain   HPI: RAYCHELL HOLCOMB is a 62 y.o. female with medical history significant for cocaine and alcohol abuse, having rarely seen a physician in the last decade, now presenting to the emergency department for evaluation abdominal pain.  Patient reports 1 week of stabbing pain in the epigastrium and called EMS.  She reports associated nausea without vomiting, and upon direct questioning acknowledges recent shortness of breath and productive cough for the past 1 to 2 weeks.  She reports only minimal alcohol use though family notes she is a heavy daily drinker.  Patient reports that she has not smoked crack in years.  She denies fevers, chills, or chest pain.  Reports that her urine has become darker recently.  Reports occasional mild headache without change in vision or hearing or focal numbness or weakness.  She denies melena or hematochezia.  ED Course: Upon arrival to the ED, patient is found to be afebrile, saturating low to mid 80s on room air, tachypneic in the 30s, slightly tachycardic, and with blood pressure 190/100.  EKG features normal sinus rhythm and chest x-ray is concerning for confluent airspace disease in the right greater than left lungs.  CT of the abdomen and pelvis is negative for acute intra-abdominal findings, but concerning for confluent groundglass density in the bilateral bases concerning for cryptogenic organizing pneumonia.  Chemistry panel reveals a BUN of 61 and creatinine of 7.58, up from 0.7 and 2011.  CBC features a microcytic anemia with hemoglobin of 7.3, down from 10.3 on her most recent prior from 2011.  Lactic acid is reassuringly normal, troponin is undetectable, BNP elevated to 226, and UDS positive for cocaine.  Fecal occult blood testing is negative.  Pulmonology/critical care was consulted by the  ED physician and recommended medical admission.  Patient was treated with Rocephin, azithromycin, IV Protonix, Dilaudid, and 500 cc normal saline.  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  Review of Systems:  All other systems reviewed and apart from HPI, are negative.  History reviewed. No pertinent past medical history.  History reviewed. No pertinent surgical history.   reports that she has been smoking.  She has been smoking about 1.00 pack per day. She has never used smokeless tobacco. She reports that she drinks alcohol. She reports that she has current or past drug history. Drug: Cocaine.  No Known Allergies  Family History  Problem Relation Age of Onset  . Autoimmune disease Neg Hx      Prior to Admission medications   Medication Sig Start Date End Date Taking? Authorizing Provider  acetaminophen (TYLENOL) 325 MG tablet Take 325-650 mg by mouth every 6 (six) hours as needed (for pain or headaches).   Yes [provider]  bismuth subsalicylate (PEPTO BISMOL) 262 MG/15ML suspension Take 30 mLs by mouth every 6 (six) hours as needed for indigestion or diarrhea or loose stools.   Yes [provider]  ibuprofen (ADVIL,MOTRIN) 200 MG tablet Take 200-400 mg by mouth every 6 (six) hours as needed (for pain or headaches).   Yes [provider]  Pseudoeph-Doxylamine-DM-APAP (NYQUIL MULTI-SYMPTOM PO) Take 20-30 mLs by mouth every 6 (six) hours as needed (for symptoms).   Yes [provider]    Physical Exam: Vitals:   02/12/18 2010 02/12/18  2026 02/12/18 2030 02/12/18 2115  BP:  (!) 161/88 (!) 145/89 (!) 160/85  Pulse:  89 87 87  Resp:  (!) 23 (!) 21 20  Temp: 99.3 F (37.4 C)     TempSrc: Rectal     SpO2:  100% 100% 100%      Constitutional: NAD, calm, in apparent discomfort Eyes: PERTLA, lids and conjunctivae normal ENMT: Mucous membranes are moist.  Posterior pharynx clear of any exudate or lesions.   Neck: normal, supple, no masses, no thyromegaly Respiratory: Coarse rales bilaterally in all fields. No accessory muscle use.  Cardiovascular: S1 & S2 heard, regular rate and rhythm. No extremity edema.   Abdomen: No distension, no tenderness, soft. Bowel sounds active.  Musculoskeletal: no clubbing / cyanosis. No joint deformity upper and lower extremities.   Skin: no significant rashes, lesions, ulcers. Warm, dry, well-perfused. Neurologic: No facial asymmetry. Sensation to light touch intact. Strength 5/5 in all 4 limbs.  Psychiatric: Alert and oriented x 3. Calm, cooperative.     Labs on Admission: I have personally reviewed following labs and imaging studies  CBC: Recent Labs  Lab 02/12/18 1534  WBC 12.0*  HGB 7.3*  HCT 23.4*  MCV 71.6*  PLT 503   Basic Metabolic Panel: Recent Labs  Lab 02/12/18 1534  NA 135  K 4.8  CL 106  CO2 19*  GLUCOSE 134*  BUN 61*  CREATININE 7.58*  CALCIUM 7.7*   GFR: CrCl cannot be calculated (Unknown ideal weight.). Liver Function Tests: Recent Labs  Lab 02/12/18 1534  AST 21  ALT 11*  ALKPHOS 53  BILITOT 0.9  PROT 7.7  ALBUMIN 2.3*   Recent Labs  Lab 02/12/18 1534  LIPASE 40   No results for input(s): AMMONIA in the last 168 hours. Coagulation Profile: Recent Labs  Lab 02/12/18 1947  INR 1.07   Cardiac Enzymes: Recent Labs  Lab 02/12/18 1801  TROPONINI <0.03   BNP (last 3 results) No results for input(s): PROBNP in the last 8760 hours. HbA1C: No results for input(s): HGBA1C in the last 72 hours. CBG: No results for input(s): GLUCAP in the last 168 hours. Lipid Profile: No results for input(s): CHOL, HDL, LDLCALC, TRIG, CHOLHDL, LDLDIRECT in the last 72 hours. Thyroid Function Tests: No results for input(s): TSH, T4TOTAL, FREET4, T3FREE, THYROIDAB in the last 72 hours. Anemia Panel: No results for input(s): VITAMINB12, FOLATE, FERRITIN, TIBC, IRON,  RETICCTPCT in the last 72 hours. Urine analysis:    Component Value Date/Time   COLORURINE YELLOW 02/12/2018 1801   APPEARANCEUR HAZY (A) 02/12/2018 1801   LABSPEC 1.013 02/12/2018 1801   PHURINE 5.0 02/12/2018 1801   GLUCOSEU NEGATIVE 02/12/2018 1801   HGBUR LARGE (A) 02/12/2018 1801   HGBUR trace-intact 03/08/2010 1452   BILIRUBINUR NEGATIVE 02/12/2018 1801   KETONESUR NEGATIVE 02/12/2018 1801   PROTEINUR 100 (A) 02/12/2018 1801   UROBILINOGEN 0.2 03/08/2010 1452   NITRITE NEGATIVE 02/12/2018 1801   LEUKOCYTESUR NEGATIVE 02/12/2018 1801   Sepsis Labs: @LABRCNTIP (procalcitonin:4,lacticidven:4) )No results found for this or any previous visit (from the past 240 hour(s)).   Radiological Exams on Admission: Ct Abdomen Pelvis Wo Contrast  Result Date: 02/12/2018 CLINICAL DATA:  Epigastric abdominal pain for the past week. EXAM: CT ABDOMEN AND PELVIS WITHOUT CONTRAST TECHNIQUE: Multidetector CT imaging of the abdomen and pelvis was performed following the standard protocol without IV contrast. COMPARISON:  CT abdomen pelvis dated February 19, 2010. FINDINGS: Lower chest: Confluent ground-glass density at the right greater than left lung  bases with inter and intralobular septal thickening, consistent with crazy paving pattern. This spares the subpleural lung. Hepatobiliary: No focal liver abnormality is seen. Status post cholecystectomy. Mild central intrahepatic and common bile duct dilatation, likely related to post cholecystectomy state. Pancreas: Unremarkable. No pancreatic ductal dilatation or surrounding inflammatory changes. Spleen: Normal in size without focal abnormality. Adrenals/Urinary Tract: Adrenal glands are unremarkable. Kidneys are normal, without renal calculi, focal lesion, or hydronephrosis. Bladder is unremarkable. Stomach/Bowel: Stomach is within normal limits. Appendix appears normal. No evidence of bowel wall thickening, distention, or inflammatory changes. Vascular/Lymphatic:  Aortic atherosclerosis. No enlarged abdominal or pelvic lymph nodes. Reproductive: Prior hysterectomy. 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 Other: Tiny fat containing umbilical hernia. No free fluid or pneumoperitoneum. Musculoskeletal: No acute or significant osseous findings. 3 mm anterolisthesis at L4-L5 due to severe facet arthropathy. Which IMPRESSION: 1.  No acute intra-abdominal process. 2. Confluent ground-glass density and inter and intralobular septal thickening at the right greater than left lung bases, consistent with crazy paving pattern. While this pattern is nonspecific, given the sparing of the subpleural lung, this may be related to cryptogenic organizing pneumonia. Pulmonology consultation is recommended. 3. 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 Electronically Signed   By: Titus Dubin M.D.   On: 02/12/2018 20:01   Dg Chest Port 1 View  Result Date: 02/12/2018 CLINICAL DATA:  Hypoxia. EXAM: PORTABLE CHEST 1 VIEW COMPARISON:  Chest x-ray dated December 10, 2011. FINDINGS: The heart size and mediastinal contours are within normal limits. Normal pulmonary vascularity. Confluent airspace disease involving the right central lung and left perihilar lung. No pleural effusion or pneumothorax. No acute osseous abnormality. IMPRESSION: 1. Confluent airspace disease in the right greater than left lungs. Electronically Signed   By: Titus Dubin M.D.   On: 02/12/2018 18:53    EKG: Independently reviewed. Sinus rhythm.   Assessment/Plan  1. Pneumonia; acute hypoxic respiratory failure  - Presents with epigastric pain, ROS positive for a couple wks of SOB and productive cough  - Found to be saturating mid-80's on rm air with grossly abnormal chest imaging concerning for COP  - Suspect this may be secondary to crack cocaine smoking, but autoimmune and infectious etiologies also considered  - Blood cultures collected in ED and empiric Rocephin and azithromycin given  - Check sputum  culture, strep pneumo and legionella antigens, respiratory virus panel, autoimmune serologies, and continue current antibiotics    2. Renal failure  - SCr is 7.58 on admission, up from most recent prior of 0.7 in 2011  - There is mild acidosis, no hyperkalemia or hypervolemia  - Kidney appear unremarkable on CT, no evidence for obstruction  - Check urine chemistries, continue IVF hydration, repeat chemistries in am    3. Microcytic anemia  - Hgb is 7.3 on admission with MCV 71.6  - She denies bleeding and FOBT is negative  - Type and screen, check anemia panel   4. Cocaine and alcohol abuse  - Minimizes her use, reports no crack smoking in years and minimal EtOH use but UDS positive for cocaine and family reports heavy daily EtOH use  - Monitor with CIWA and prn Ativan    5. Hypertensive urgency  - BP elevated to 190/100 in ED, improved some with pain-control  - Continue prn analgesia, use hydralazine IVP's as needed    6. Epigastric pain  - This was the chief complaint  - Abd CT is negative for acute findings  - Possibly  esophagitis; improved with IV Protonix ED  - Continue daily Protonix     DVT prophylaxis: sq heparin  Code Status: Full  Family Communication: Son updated at bedside Consults called: PCCM Admission status: Inpatient    Vianne Bulls, MD [facility] Pager [phone]  If 7PM-7AM, please contact night-coverage [email or web address] Password [facility]  02/12/2018, 9:32 PM

## 2018-02-13 ENCOUNTER — Other Ambulatory Visit: Payer: Self-pay

## 2018-02-13 DIAGNOSIS — N179 Acute kidney failure, unspecified: Secondary | ICD-10-CM

## 2018-02-13 DIAGNOSIS — R0902 Hypoxemia: Secondary | ICD-10-CM

## 2018-02-13 LAB — SEDIMENTATION RATE: Sed Rate: 60 mm/hr — ABNORMAL HIGH (ref 0–22)

## 2018-02-13 LAB — BASIC METABOLIC PANEL
Anion gap: 8 (ref 5–15)
Anion gap: 8 (ref 5–15)
BUN: 62 mg/dL — ABNORMAL HIGH (ref 6–20)
BUN: 63 mg/dL — ABNORMAL HIGH (ref 6–20)
CALCIUM: 7.4 mg/dL — AB (ref 8.9–10.3)
CHLORIDE: 111 mmol/L (ref 101–111)
CO2: 17 mmol/L — ABNORMAL LOW (ref 22–32)
CO2: 19 mmol/L — AB (ref 22–32)
Calcium: 7.2 mg/dL — ABNORMAL LOW (ref 8.9–10.3)
Chloride: 112 mmol/L — ABNORMAL HIGH (ref 101–111)
Creatinine, Ser: 7.23 mg/dL — ABNORMAL HIGH (ref 0.44–1.00)
Creatinine, Ser: 7.32 mg/dL — ABNORMAL HIGH (ref 0.44–1.00)
GFR calc Af Amer: 6 mL/min — ABNORMAL LOW (ref >60)
GFR calc non Af Amer: 5 mL/min — ABNORMAL LOW (ref >60)
GFR calc non Af Amer: 5 mL/min — ABNORMAL LOW (ref >60)
GFR, EST AFRICAN AMERICAN: 6 mL/min — AB (ref >60)
Glucose, Bld: 109 mg/dL — ABNORMAL HIGH (ref 65–99)
Glucose, Bld: 113 mg/dL — ABNORMAL HIGH (ref 65–99)
POTASSIUM: 5.4 mmol/L — AB (ref 3.5–5.1)
Potassium: 5.9 mmol/L — ABNORMAL HIGH (ref 3.5–5.1)
Sodium: 137 mmol/L (ref 135–145)
Sodium: 138 mmol/L (ref 135–145)

## 2018-02-13 LAB — RETICULOCYTES
RBC.: 2.88 MIL/uL — AB (ref 3.87–5.11)
Retic Count, Absolute: 77.8 10*3/uL (ref 19.0–186.0)
Retic Ct Pct: 2.7 % (ref 0.4–3.1)

## 2018-02-13 LAB — RESPIRATORY PANEL BY PCR
Adenovirus: NOT DETECTED
BORDETELLA PERTUSSIS-RVPCR: NOT DETECTED
CORONAVIRUS 229E-RVPPCR: NOT DETECTED
CORONAVIRUS OC43-RVPPCR: NOT DETECTED
Chlamydophila pneumoniae: NOT DETECTED
Coronavirus HKU1: NOT DETECTED
Coronavirus NL63: NOT DETECTED
INFLUENZA A-RVPPCR: NOT DETECTED
INFLUENZA B-RVPPCR: NOT DETECTED
METAPNEUMOVIRUS-RVPPCR: NOT DETECTED
MYCOPLASMA PNEUMONIAE-RVPPCR: NOT DETECTED
PARAINFLUENZA VIRUS 1-RVPPCR: NOT DETECTED
PARAINFLUENZA VIRUS 4-RVPPCR: NOT DETECTED
Parainfluenza Virus 2: NOT DETECTED
Parainfluenza Virus 3: NOT DETECTED
RESPIRATORY SYNCYTIAL VIRUS-RVPPCR: NOT DETECTED
Rhinovirus / Enterovirus: NOT DETECTED

## 2018-02-13 LAB — CBC
HCT: 21.4 % — ABNORMAL LOW (ref 36.0–46.0)
HEMATOCRIT: 25.5 % — AB (ref 36.0–46.0)
HEMOGLOBIN: 8.1 g/dL — AB (ref 12.0–15.0)
Hemoglobin: 6.7 g/dL — CL (ref 12.0–15.0)
MCH: 22.8 pg — ABNORMAL LOW (ref 26.0–34.0)
MCH: 24.2 pg — AB (ref 26.0–34.0)
MCHC: 31.3 g/dL (ref 30.0–36.0)
MCHC: 31.8 g/dL (ref 30.0–36.0)
MCV: 72.8 fL — ABNORMAL LOW (ref 78.0–100.0)
MCV: 76.1 fL — AB (ref 78.0–100.0)
Platelets: 272 10*3/uL (ref 150–400)
Platelets: 259 10*3/uL (ref 150–400)
RBC: 2.94 MIL/uL — ABNORMAL LOW (ref 3.87–5.11)
RBC: 3.35 MIL/uL — ABNORMAL LOW (ref 3.87–5.11)
RDW: 16.7 % — ABNORMAL HIGH (ref 11.5–15.5)
RDW: 18.5 % — ABNORMAL HIGH (ref 11.5–15.5)
WBC: 13.5 10*3/uL — ABNORMAL HIGH (ref 4.0–10.5)
WBC: 12.7 10*3/uL — ABNORMAL HIGH (ref 4.0–10.5)

## 2018-02-13 LAB — CBG MONITORING, ED
Glucose-Capillary: 115 mg/dL — ABNORMAL HIGH (ref 65–99)
Glucose-Capillary: 147 mg/dL — ABNORMAL HIGH (ref 65–99)

## 2018-02-13 LAB — C-REACTIVE PROTEIN: CRP: 7 mg/dL — ABNORMAL HIGH (ref <1.0)

## 2018-02-13 LAB — HIV ANTIBODY (ROUTINE TESTING W REFLEX): HIV SCREEN 4TH GENERATION: NONREACTIVE

## 2018-02-13 LAB — PREPARE RBC (CROSSMATCH)

## 2018-02-13 LAB — STREP PNEUMONIAE URINARY ANTIGEN: STREP PNEUMO URINARY ANTIGEN: POSITIVE — AB

## 2018-02-13 LAB — SODIUM, URINE, RANDOM: Sodium, Ur: 85 mmol/L

## 2018-02-13 LAB — FERRITIN: FERRITIN: 419 ng/mL — AB (ref 11–307)

## 2018-02-13 LAB — VITAMIN B12: Vitamin B-12: 347 pg/mL (ref 180–914)

## 2018-02-13 LAB — IRON AND TIBC
IRON: 34 ug/dL (ref 28–170)
Saturation Ratios: 13 % (ref 10.4–31.8)
TIBC: 255 ug/dL (ref 250–450)
UIBC: 221 ug/dL

## 2018-02-13 LAB — CREATININE, URINE, RANDOM: CREATININE, URINE: 80.81 mg/dL

## 2018-02-13 LAB — MRSA PCR SCREENING: MRSA by PCR: NEGATIVE

## 2018-02-13 LAB — GLUCOSE, CAPILLARY: Glucose-Capillary: 87 mg/dL (ref 65–99)

## 2018-02-13 LAB — FOLATE: Folate: 12.8 ng/mL (ref >5.9)

## 2018-02-13 MED ORDER — METHYLPREDNISOLONE SODIUM SUCC 40 MG IJ SOLR
40.0000 mg | Freq: Every day | INTRAMUSCULAR | Status: DC
  Administered 2018-02-13 – 2018-02-15 (×3): 40 mg via INTRAVENOUS
  Filled 2018-02-13 (×3): qty 1

## 2018-02-13 MED ORDER — SODIUM CHLORIDE 0.9 % IV SOLN
Freq: Once | INTRAVENOUS | Status: AC
  Administered 2018-02-13: 06:00:00 via INTRAVENOUS

## 2018-02-13 MED ORDER — HYDROMORPHONE HCL 1 MG/ML IJ SOLN
1.0000 mg | INTRAMUSCULAR | Status: DC | PRN
  Administered 2018-02-19 – 2018-03-14 (×37): 1 mg via INTRAVENOUS
  Filled 2018-02-13 (×38): qty 1

## 2018-02-13 MED ORDER — SODIUM CHLORIDE 0.45 % IV SOLN
INTRAVENOUS | Status: DC
  Administered 2018-02-13 – 2018-02-14 (×2): via INTRAVENOUS

## 2018-02-13 MED ORDER — FAMOTIDINE IN NACL 20-0.9 MG/50ML-% IV SOLN
20.0000 mg | Freq: Two times a day (BID) | INTRAVENOUS | Status: DC
  Administered 2018-02-13 (×2): 20 mg via INTRAVENOUS
  Filled 2018-02-13 (×2): qty 50

## 2018-02-13 MED ORDER — SUCRALFATE 1 GM/10ML PO SUSP: 1.0000 g | Freq: Three times a day (TID) | ORAL | Status: DC

## 2018-02-13 NOTE — ED Notes (Signed)
Pt's CBG result was 115. Informed Rod Holler - RN.

## 2018-02-13 NOTE — ED Notes (Signed)
Breakfast tray ordered, Heart Healthy with Thin liquids

## 2018-02-13 NOTE — ED Notes (Signed)
Charge RN rounding on patient. Spoke with family briefly.

## 2018-02-13 NOTE — Progress Notes (Signed)
PROGRESS NOTE    Rhonda Lynch  FOY:[id number] DOB: 1955-10-24 DOA: 02/12/2018 PCP: Patient, No Pcp Per    Brief Narrative:  62 year old female who presented with epigastric abdominal pain.  He does have significant past medical history for alcohol and cocaine abuse.  Reported 1 week of epigastric abdominal pain, stabbing in nature, severe intensity, associated with nausea.  Lately had noted dyspnea and productive cough.  On the initial physical examination she was hypoxic down to 80%, tachypneic with respirate in the 30s, her heart rate was 89 with blood pressure 161/88, temperature 99.3.  She had rhonchi bilaterally, no wheezing, heart S1-S2 present and rhythmic, tachycardic, her abdomen was soft nontender, no lower extremity edema.  Sodium 135, potassium 4.8, chloride 106, bicarb 19, glucose 134, BUN 16, creatinine 7.5, white count 12.0.  Urinalysis with specific gravity 1.0 130, protein 100, positive granular casts, hyaline casts,k greater than 50 red cells, 0-5 white cells.  Urine sodium 85, Streptococcus pneumoniae urine antigen positive.  Drug screen positive for cocaine.  Chest x-ray with bilateral alveolar infiltrates, right lower lobe and left upper lobe.  EKG normal sinus rhythm, normal intervals, normal axis.  Patient was admitted to the hospital working diagnosis sepsis due to bilateral community-acquired pneumonia/aspiration, complicated by acute hypoxic respiratory failure and acute kidney injury.   Assessment & Plan:   Principal Problem:   PNEUMONIA Active Problems:   ELEVATED BLOOD PRESSURE WITHOUT DIAGNOSIS OF HYPERTENSION   Renal failure   Acute respiratory failure with hypoxia (HCC)   Alcohol abuse   Microcytic anemia   Cocaine abuse (HCC)   Epigastric pain   Pneumonia   1. Sepsis due to bilateral pneumonia/ multilobar./ aspiration/ present on admission. Due to streptococcus pneumonia. Will continue antibiotic therapy with ceftriaxone and azithromycin for now. Resume  IV fluids with hypotonic saline to prevent worsening acidosis. Follow temperature curve and cell count. Patient has high risk for worsening, will add systemic steroids due to severe pneumonia.   2. Acute hypoxic respiratory failure. Bilateral pneumonia, will continue oxymetry monitoring and supplemental 02 per [place], will add bronchodilator therapy with duoneb. Systemic steroids, and radiographic follow up in 48 hours.   3. AKI due to sepsis/ to rule out ATN. Significant worsening of renal function, will continue IV fluids with hypotonic saline, will place a foley catheter to measure urine output. Positive urine granular casts, and low specific gravity, denoting poor prognosis. If worsening renal function will consult nephrology in am may need renal replacement therapy. Serum bicarbonate is 17.   4. Anemia of iron deficiency on anemia of chronic renal disease. Sp prbc transfusion, follow hb up to 8.1, will continue close follow up of cell count. May need IV iron before discharge.   5. Alcohol and cocaine abuse. Positive tremors, mild confusion, will continue benzodiazepine per protocol. Aspiration and fall precautions.      DVT prophylaxis: scd  Code Status: full Family Communication: I spoke with patient's son at the bedside and all questions were addressed.  Disposition Plan: step down unit   Consultants:     Procedures:     Antimicrobials:    Ceftriaxone  Azithromycin    Subjective: Patient with dyspnea and abdominal pain, no chest pain, no nausea or vomiting. Somnolent unable to get detailed history.   Objective: Vitals:   02/13/18 1100 02/13/18 1130 02/13/18 1232 02/13/18 1241  BP:  (!) 149/87  128/72  Pulse: 92 93 96   Resp: (!) 27 (!) 25 (!) 29 (!) 31  Temp:  TempSrc:      SpO2: 100% 94% 98%     Intake/Output Summary (Last 24 hours) at 02/13/2018 1256 Last data filed at 02/13/2018 0918 Gross per 24 hour  Intake 1916.34 ml  Output -  Net 1916.34 ml   There  were no vitals filed for this visit.  Examination:   General: deconditioned and ill looking appearing Neurology: Awake and alert, non focal  E ENT: mild pallor, no icterus, oral mucosa moist Cardiovascular: No JVD. S1-S2 present, rhythmic, no gallops, rubs, or murmurs. Trace lower extremity edema. Pulmonary: decreased breath sounds bilaterally,poor air movement, no wheezing, positive bilateral rhonchi and rales. Gastrointestinal. Abdomen with no organomegaly, non tender, no rebound or guarding Skin. No rashes Musculoskeletal: no joint deformities     Data Reviewed: I have personally reviewed following labs and imaging studies  CBC: Recent Labs  Lab 02/12/18 1534 02/13/18 0421 02/13/18 1153  WBC 12.0* 13.5* 12.7*  HGB 7.3* 6.7* 8.1*  HCT 23.4* 21.4* 25.5*  MCV 71.6* 72.8* 76.1*  PLT 306 272 825   Basic Metabolic Panel: Recent Labs  Lab 02/12/18 1534 02/13/18 0421  NA 135 137  K 4.8 5.9*  CL 106 112*  CO2 19* 17*  GLUCOSE 134* 109*  BUN 61* 62*  CREATININE 7.58* 7.23*  CALCIUM 7.7* 7.2*   GFR: CrCl cannot be calculated (Unknown ideal weight.). Liver Function Tests: Recent Labs  Lab 02/12/18 1534  AST 21  ALT 11*  ALKPHOS 53  BILITOT 0.9  PROT 7.7  ALBUMIN 2.3*   Recent Labs  Lab 02/12/18 1534  LIPASE 40   No results for input(s): AMMONIA in the last 168 hours. Coagulation Profile: Recent Labs  Lab 02/12/18 1947  INR 1.07   Cardiac Enzymes: Recent Labs  Lab 02/12/18 1801  TROPONINI <0.03   BNP (last 3 results) No results for input(s): PROBNP in the last 8760 hours. HbA1C: No results for input(s): HGBA1C in the last 72 hours. CBG: Recent Labs  Lab 02/13/18 0752 02/13/18 1028  GLUCAP 115* 147*   Lipid Profile: No results for input(s): CHOL, HDL, LDLCALC, TRIG, CHOLHDL, LDLDIRECT in the last 72 hours. Thyroid Function Tests: No results for input(s): TSH, T4TOTAL, FREET4, T3FREE, THYROIDAB in the last 72 hours. Anemia Panel: Recent  Labs    02/12/18 2344  VITAMINB12 347  FOLATE 12.8  FERRITIN 419*  TIBC 255  IRON 34  RETICCTPCT 2.7      Radiology Studies: I have reviewed all of the imaging during this hospital visit personally     Scheduled Meds: . folic acid  1 mg Oral Daily  . heparin  5,000 Units Subcutaneous Q8H  . LORazepam  0-4 mg Oral Q6H   Followed by  . [START ON 02/14/2018] LORazepam  0-4 mg Oral Q12H  . multivitamin with minerals  1 tablet Oral Daily  . oxyCODONE-acetaminophen  1 tablet Oral Once  . sodium chloride flush  3 mL Intravenous Q12H  . sucralfate  1 g Oral TID WC & HS  . thiamine  100 mg Oral Daily   Or  . thiamine  100 mg Intravenous Daily   Continuous Infusions: . sodium chloride 75 mL/hr at 02/13/18 1217  . azithromycin    . cefTRIAXone (ROCEPHIN)  IV    . famotidine (PEPCID) IV 20 mg (02/13/18 1217)     LOS: 1 day        Mauricio Gerome Apley, MD [facility] Pager [phone]

## 2018-02-13 NOTE — ED Notes (Signed)
Pt assisted to chair on room air, sats noted to be 79% on room air. Pt also c/o severe itching and nausea. Ativan given per orders. No rash noted. Pt placed on 2L , sats improved to 97% on 2L

## 2018-02-13 NOTE — ED Notes (Signed)
Pt sleeping. Family @ bedside

## 2018-02-13 NOTE — ED Notes (Signed)
Hospital bed requested from [facility]

## 2018-02-13 NOTE — ED Notes (Signed)
Family at bedside. 

## 2018-02-13 NOTE — Progress Notes (Signed)
PROGRESS NOTE    Rhonda Lynch  FIE:[id number] DOB: 1956/09/09 DOA: 02/12/2018 PCP: Patient, No Pcp Per    Brief Narrative:  Patient is a 62 year old female with history of tobacco, cocaine and alcohol abuse admitted to the hospital abdominal pain, pneumonia, alcohol withdrawal, and sepsis. Vitals upon admission were BP 173/95, Pulse 101, Temp 99.6, Resp 24, SpO2 91 on RA. Patient put on oxygen via nasal cannula to maintain oxygen saturation. CT abdomen/pelvis showed no acute intra-abdominal findings but did show ground glass appearance of the lungs indicative of pneumonia. This is consistent with disease seen on the chest x-ray. Sepsis suspected secondary to pneumonia infection, blood cultures are pending.    Assessment & Plan:   Principal Problem:   PNEUMONIA Active Problems:   ELEVATED BLOOD PRESSURE WITHOUT DIAGNOSIS OF HYPERTENSION   Renal failure   Acute respiratory failure with hypoxia (HCC)   Alcohol abuse   Microcytic anemia   Cocaine abuse (HCC)   Epigastric pain   Pneumonia   1. Sepsis -Start half NS at 75 mL/hr, patient was hyperchloremic at 112 so half NS over NS chose to fluid resucitate -Blood cultures x2 pending -Lactic acid .76  -Sepsis secondary to suspected pneumonia infection due to chest X-ray and CT findings, cough with sputum production, leukocytosis, tachypnea, and tachycardia -Patient remains stable with vitals ranging pulse in upper 90s to low 100s, Resp in the 20s, SpO2 in the low 90s and above, Temp 99-100 and BP around 150/90 -Being treated with Azithromycin and Rocephin for CAP -Continue to monitor vitals, obtain CBC and BMP tomorrow morning -Continue O2 via nasal cannula as needed  2. Pneumonia/acute respiratory failure with hypoxia -Chest X-ray indicative of confluent airspace disease. CT shows confluent ground glass density and inter and intralobular septal thickening. Findings suggestive of cryptogenic pneumonia -Strep pneumo urinary antigen  present -Sputum culture pending -IV Azithromycin and Rocephin  3. Acute Kidney Injury -Creatinine elevated to 7.58, last lab values from 2011 show creatinine of .7  -Urine shows granular and hyaline casts, large Hgb on dipstick, proteinuria, and bacteria.  -AKI likely secondary to sepsis, treating with antibiotics and IV fluid-repeat BMP tomorrow morning  4. Microcytic anemia -CBC on admission showed RBC 3.27, Hemoglobin 7.3, HCT 23.4, MCV 71.6, MCH 22.3, RDW 16.5 -Blood transfusion ordered   5. Cocaine/alcohol abuse -Patient reports drinking occasionally and denies cocaine use. Family reports heavy alcohol use by patient -UDS positive for cocaine use -CIWA protocol, continue thiamine 100mg  qd and Ativan 1mg  q6 hours PRN  6. Hypertensive urgency -Continue to monitor BP, overall BP is trending down since last night. Elevated BP likely secondary to pain/stress  7. Epigastric pain -CT abdomen/pelvis shows no acute intra-abdominal process -Lipase, AST, and ALT within normal ranges -Abdominal pain likely due to gastritis from alcohol consumption -Continue Zofran 4mg  PRN q6 hours, start Famotidine 20mg  IV BID, start Carafate 1g TID   DVT prophylaxis: Heparin  Code Status: Full Code Family Communication: Son at bedside Disposition Plan: Home when improved   Consultants:     Procedures:     Antimicrobials:   Rocephin  Azithromycin   Subjective: Patient reports abdominal pain has not improved since coming to the hospital. She reports decreased nausea and vomiting. She states she has been coughing for a while but recently developed a greenish sputum. She endorses shortness of breath and chills. She denies fevers, body aches, weakness, seizures, and sick contacts. She states she only has beer occasionally, however her son reports she drinks  liquor daily.   Objective: Vitals:   02/13/18 1130 02/13/18 1232 02/13/18 1241 02/13/18 1333  BP: (!) 149/87  128/72 (!) 153/85    Pulse: 93 96  92  Resp: (!) 25 (!) 29 (!) 31   Temp:    98.9 F (37.2 C)  TempSrc:    Oral  SpO2: 94% 98%      Intake/Output Summary (Last 24 hours) at 02/13/2018 1456 Last data filed at 02/13/2018 0918 Gross per 24 hour  Intake 1916.34 ml  Output -  Net 1916.34 ml   There were no vitals filed for this visit.  Examination:   General: Patient appears ill and uncomfortable. She appears fatigued and has increased respirations without increased effort Neurology: Awake and responsive, but fatigued E ENT: pallor present, patient appears dry Cardiovascular: No JVD. S1-S2 present, rhythmic, no gallops, rubs, or murmurs. No lower extremity edema. Pulmonary: rhonchi present bilaterally throughout, no retractions or accessory muscle use Gastrointestinal. Abdomen flat, no organomegaly, mild tenderness over the epigastric region, no rebound or guarding Skin. No rashes Musculoskeletal: no joint deformities     Data Reviewed: I have personally reviewed following labs and imaging studies  CBC: Recent Labs  Lab 02/12/18 1534 02/13/18 0421 02/13/18 1153  WBC 12.0* 13.5* 12.7*  HGB 7.3* 6.7* 8.1*  HCT 23.4* 21.4* 25.5*  MCV 71.6* 72.8* 76.1*  PLT 306 272 109   Basic Metabolic Panel: Recent Labs  Lab 02/12/18 1534 02/13/18 0421  NA 135 137  K 4.8 5.9*  CL 106 112*  CO2 19* 17*  GLUCOSE 134* 109*  BUN 61* 62*  CREATININE 7.58* 7.23*  CALCIUM 7.7* 7.2*   GFR: CrCl cannot be calculated (Unknown ideal weight.). Liver Function Tests: Recent Labs  Lab 02/12/18 1534  AST 21  ALT 11*  ALKPHOS 53  BILITOT 0.9  PROT 7.7  ALBUMIN 2.3*   Recent Labs  Lab 02/12/18 1534  LIPASE 40   No results for input(s): AMMONIA in the last 168 hours. Coagulation Profile: Recent Labs  Lab 02/12/18 1947  INR 1.07   Cardiac Enzymes: Recent Labs  Lab 02/12/18 1801  TROPONINI <0.03   BNP (last 3 results) No results for input(s): PROBNP in the last 8760 hours. HbA1C: No results  for input(s): HGBA1C in the last 72 hours. CBG: Recent Labs  Lab 02/13/18 0752 02/13/18 1028  GLUCAP 115* 147*   Lipid Profile: No results for input(s): CHOL, HDL, LDLCALC, TRIG, CHOLHDL, LDLDIRECT in the last 72 hours. Thyroid Function Tests: No results for input(s): TSH, T4TOTAL, FREET4, T3FREE, THYROIDAB in the last 72 hours. Anemia Panel: Recent Labs    02/12/18 2344  VITAMINB12 347  FOLATE 12.8  FERRITIN 419*  TIBC 255  IRON 34  RETICCTPCT 2.7      Radiology Studies: I have reviewed all of the imaging during this hospital visit personally     Scheduled Meds: . folic acid  1 mg Oral Daily  . heparin  5,000 Units Subcutaneous Q8H  . LORazepam  0-4 mg Oral Q6H   Followed by  . [START ON 02/14/2018] LORazepam  0-4 mg Oral Q12H  . multivitamin with minerals  1 tablet Oral Daily  . sodium chloride flush  3 mL Intravenous Q12H  . sucralfate  1 g Oral TID WC & HS  . thiamine  100 mg Oral Daily   Or  . thiamine  100 mg Intravenous Daily   Continuous Infusions: . sodium chloride 75 mL/hr at 02/13/18 1217  . azithromycin    .  cefTRIAXone (ROCEPHIN)  IV    . famotidine (PEPCID) IV Stopped (02/13/18 1247)     LOS: 1 day    Carita Pian, PA-S   [facility] Pager [phone]

## 2018-02-13 NOTE — ED Notes (Signed)
Gave pt apple juice, per Rod Holler - RN.

## 2018-02-13 NOTE — ED Notes (Signed)
Date and time results received: 02/13/18 5:15 AM (use smartphrase ".now" to insert current time)  Test: Hemoglobin Critical Value: 6.7  Name of Provider Notified: Dr. Myna Hidalgo via text page  Orders Received? Or Actions Taken?: awaiting response

## 2018-02-13 NOTE — ED Notes (Signed)
Gave pt orange juice, per Rod Holler - RN.

## 2018-02-13 NOTE — ED Notes (Signed)
Per RN placed purewick

## 2018-02-14 ENCOUNTER — Inpatient Hospital Stay (HOSPITAL_COMMUNITY): Payer: Medicaid Other

## 2018-02-14 ENCOUNTER — Encounter (HOSPITAL_COMMUNITY): Payer: Self-pay | Admitting: *Deleted

## 2018-02-14 LAB — BASIC METABOLIC PANEL
Anion gap: 8 (ref 5–15)
Anion gap: 10 (ref 5–15)
BUN: 71 mg/dL — AB (ref 6–20)
BUN: 73 mg/dL — ABNORMAL HIGH (ref 6–20)
CALCIUM: 7.7 mg/dL — AB (ref 8.9–10.3)
CALCIUM: 7.9 mg/dL — AB (ref 8.9–10.3)
CHLORIDE: 111 mmol/L (ref 101–111)
CO2: 16 mmol/L — ABNORMAL LOW (ref 22–32)
CO2: 18 mmol/L — AB (ref 22–32)
CREATININE: 7.72 mg/dL — AB (ref 0.44–1.00)
CREATININE: 7.85 mg/dL — AB (ref 0.44–1.00)
Chloride: 109 mmol/L (ref 101–111)
GFR calc Af Amer: 6 mL/min — ABNORMAL LOW (ref >60)
GFR calc Af Amer: 6 mL/min — ABNORMAL LOW (ref >60)
GFR calc non Af Amer: 5 mL/min — ABNORMAL LOW (ref >60)
GFR calc non Af Amer: 5 mL/min — ABNORMAL LOW (ref >60)
GLUCOSE: 200 mg/dL — AB (ref 65–99)
Glucose, Bld: 214 mg/dL — ABNORMAL HIGH (ref 65–99)
Potassium: 6.2 mmol/L — ABNORMAL HIGH (ref 3.5–5.1)
Potassium: 5.6 mmol/L — ABNORMAL HIGH (ref 3.5–5.1)
SODIUM: 135 mmol/L (ref 135–145)
Sodium: 137 mmol/L (ref 135–145)

## 2018-02-14 LAB — CENTROMERE ANTIBODIES

## 2018-02-14 LAB — CBC WITH DIFFERENTIAL/PLATELET
Abs Immature Granulocytes: 0.1 10*3/uL (ref 0.0–0.1)
BASOS PCT: 0 %
Basophils Absolute: 0 10*3/uL (ref 0.0–0.1)
EOS ABS: 0 10*3/uL (ref 0.0–0.7)
EOS PCT: 0 %
HEMATOCRIT: 24.5 % — AB (ref 36.0–46.0)
Hemoglobin: 7.7 g/dL — ABNORMAL LOW (ref 12.0–15.0)
Immature Granulocytes: 1 %
LYMPHS ABS: 1 10*3/uL (ref 0.7–4.0)
Lymphocytes Relative: 8 %
MCH: 24.4 pg — ABNORMAL LOW (ref 26.0–34.0)
MCHC: 31.4 g/dL (ref 30.0–36.0)
MCV: 77.8 fL — AB (ref 78.0–100.0)
MONOS PCT: 3 %
Monocytes Absolute: 0.4 10*3/uL (ref 0.1–1.0)
Neutro Abs: 11.1 10*3/uL — ABNORMAL HIGH (ref 1.7–7.7)
Neutrophils Relative %: 88 %
Platelets: 261 10*3/uL (ref 150–400)
RBC: 3.15 MIL/uL — AB (ref 3.87–5.11)
RDW: 18.3 % — AB (ref 11.5–15.5)
WBC: 12.6 10*3/uL — AB (ref 4.0–10.5)

## 2018-02-14 LAB — UREA NITROGEN, URINE: UREA NITROGEN UR: 406 mg/dL

## 2018-02-14 LAB — LEGIONELLA PNEUMOPHILA SEROGP 1 UR AG: L. pneumophila Serogp 1 Ur Ag: NEGATIVE

## 2018-02-14 LAB — ANTI-DNA ANTIBODY, DOUBLE-STRANDED

## 2018-02-14 LAB — GLUCOSE, CAPILLARY: GLUCOSE-CAPILLARY: 207 mg/dL — AB (ref 65–99)

## 2018-02-14 LAB — ANTI-SCLERODERMA ANTIBODY: Scleroderma (Scl-70) (ENA) Antibody, IgG: <0.2 AI (ref 0.0–0.9)

## 2018-02-14 LAB — ANA: Anti Nuclear Antibody(ANA): POSITIVE — AB

## 2018-02-14 LAB — CK: Total CK: 210 U/L (ref 38–234)

## 2018-02-14 LAB — ANTI-JO 1 ANTIBODY, IGG

## 2018-02-14 MED ORDER — SODIUM POLYSTYRENE SULFONATE 15 GM/60ML PO SUSP
30.0000 g | ORAL | Status: AC
  Administered 2018-02-14 (×2): 30 g via ORAL
  Filled 2018-02-14 (×2): qty 120

## 2018-02-14 MED ORDER — INSULIN ASPART 100 UNIT/ML IV SOLN
10.0000 [IU] | Freq: Once | INTRAVENOUS | Status: AC
  Administered 2018-02-14: 10 [IU] via INTRAVENOUS

## 2018-02-14 MED ORDER — PATIROMER SORBITEX CALCIUM 8.4 G PO PACK
8.4000 g | PACK | Freq: Every day | ORAL | Status: DC
  Administered 2018-02-15: 8.4 g via ORAL
  Filled 2018-02-14 (×2): qty 1

## 2018-02-14 MED ORDER — STERILE WATER FOR INJECTION IV SOLN
INTRAVENOUS | Status: DC
  Administered 2018-02-14 – 2018-02-15 (×3): via INTRAVENOUS
  Filled 2018-02-14 (×4): qty 850

## 2018-02-14 MED ORDER — DEXTROSE 50 % IV SOLN
1.0000 | Freq: Once | INTRAVENOUS | Status: AC
  Administered 2018-02-14: 50 mL via INTRAVENOUS
  Filled 2018-02-14: qty 50

## 2018-02-14 MED ORDER — SOD CITRATE-CITRIC ACID 500-334 MG/5ML PO SOLN
30.0000 mL | Freq: Three times a day (TID) | ORAL | Status: DC
  Administered 2018-02-14: 30 mL via ORAL
  Filled 2018-02-14 (×2): qty 30

## 2018-02-14 MED ORDER — FAMOTIDINE IN NACL 20-0.9 MG/50ML-% IV SOLN
20.0000 mg | INTRAVENOUS | Status: DC
  Administered 2018-02-15: 20 mg via INTRAVENOUS
  Filled 2018-02-14: qty 50

## 2018-02-14 NOTE — Consult Note (Signed)
Reason for Consult: Acute kidney injury Referring Physician: Sander Radon MD [facility])  HPI:  62 year old African-American woman with past medical history that is unremarkable for chronic medical problems (does not have regular health care) but is significant for ongoing alcohol use and cocaine use.  Presented to the emergency room with 1 week history of sharp epigastric abdominal pain with associated nausea and vomiting but without any diarrhea.  Also reported some preceding cough/sputum production and shortness of breath prior to her presentation to the emergency room.  Additional evaluation revealed presence of acute kidney injury-creatinine of 7.6 up from 0.7 back in 2011 (no labs in the interim), hyperkalemia and radiographic imaging suggestive of cryptogenic organizing pneumonia.  She has had some fluids since admission but renal function has not improved.  Urinalysis showed concentrated urine with proteinuria and hematuria.  She reports no prior history of acute kidney injury, history of kidney stones or recurrent urinary tract infections. She denies any strong familial history of renal disease or autoimmune disorders such as lupus.  Reports that she was taking about 2-3 tablets of ibuprofen and 2 tablets of naproxen almost every night.  Reports infrequent alcohol use and states that she last used cocaine 1 month ago.  She continues to smoke cigarettes and denies hemoptysis.  History reviewed. No pertinent past medical history.  History reviewed. No pertinent surgical history.  Family History  Problem Relation Age of Onset  . Autoimmune disease Neg Hx     Social History:  reports that she has been smoking.  She has been smoking about 1.00 pack per day. She has never used smokeless tobacco. She reports that she drinks alcohol. She reports that she has current or past drug history. Drug: Cocaine.  Allergies: No Known Allergies  Medications:  Scheduled: . dextrose  1 ampule Intravenous Once   . folic acid  1 mg Oral Daily  . heparin  5,000 Units Subcutaneous Q8H  . insulin aspart  10 Units Intravenous Once  . LORazepam  0-4 mg Oral Q6H   Followed by  . LORazepam  0-4 mg Oral Q12H  . methylPREDNISolone (SOLU-MEDROL) injection  40 mg Intravenous Daily  . multivitamin with minerals  1 tablet Oral Daily  . sodium chloride flush  3 mL Intravenous Q12H  . sodium citrate-citric acid  30 mL Oral TID PC & HS  . sodium polystyrene  30 g Oral Q4H  . thiamine  100 mg Oral Daily   Or  . thiamine  100 mg Intravenous Daily    BMP Latest Ref Rng & Units 02/14/2018 02/13/2018 02/13/2018  Glucose 65 - 99 mg/dL 214(H) 113(H) 109(H)  BUN 6 - 20 mg/dL 71(H) 63(H) 62(H)  Creatinine 0.44 - 1.00 mg/dL 7.72(H) 7.32(H) 7.23(H)  Sodium 135 - 145 mmol/L 135 138 137  Potassium 3.5 - 5.1 mmol/L 6.2(H) 5.4(H) 5.9(H)  Chloride 101 - 111 mmol/L 111 111 112(H)  CO2 22 - 32 mmol/L 16(L) 19(L) 17(L)  Calcium 8.9 - 10.3 mg/dL 7.7(L) 7.4(L) 7.2(L)   CBC Latest Ref Rng & Units 02/14/2018 02/13/2018 02/13/2018  WBC 4.0 - 10.5 K/uL 12.6(H) 12.7(H) 13.5(H)  Hemoglobin 12.0 - 15.0 g/dL 7.7(L) 8.1(L) 6.7(LL)  Hematocrit 36.0 - 46.0 % 24.5(L) 25.5(L) 21.4(L)  Platelets 150 - 400 K/uL 261 259 272   Urinalysis    Component Value Date/Time   COLORURINE YELLOW 02/12/2018 1801   APPEARANCEUR HAZY (A) 02/12/2018 1801   LABSPEC 1.013 02/12/2018 1801   PHURINE 5.0 02/12/2018 1801   GLUCOSEU NEGATIVE 02/12/2018  1801   HGBUR LARGE (A) 02/12/2018 1801   HGBUR trace-intact 03/08/2010 1452   BILIRUBINUR NEGATIVE 02/12/2018 1801   KETONESUR NEGATIVE 02/12/2018 1801   PROTEINUR 100 (A) 02/12/2018 1801   UROBILINOGEN 0.2 03/08/2010 1452   NITRITE NEGATIVE 02/12/2018 1801   LEUKOCYTESUR NEGATIVE 02/12/2018 1801    Drugs of Abuse     Component Value Date/Time   LABOPIA NONE DETECTED 02/12/2018 1801   COCAINSCRNUR POSITIVE (A) 02/12/2018 1801   LABBENZ NONE DETECTED 02/12/2018 1801   AMPHETMU NONE DETECTED 02/12/2018 1801    THCU NONE DETECTED 02/12/2018 1801   LABBARB NONE DETECTED 02/12/2018 1801      Ct Abdomen Pelvis Wo Contrast  Result Date: 02/12/2018 CLINICAL DATA:  Epigastric abdominal pain for the past week. EXAM: CT ABDOMEN AND PELVIS WITHOUT CONTRAST TECHNIQUE: Multidetector CT imaging of the abdomen and pelvis was performed following the standard protocol without IV contrast. COMPARISON:  CT abdomen pelvis dated February 19, 2010. FINDINGS: Lower chest: Confluent ground-glass density at the right greater than left lung bases with inter and intralobular septal thickening, consistent with crazy paving pattern. This spares the subpleural lung. Hepatobiliary: No focal liver abnormality is seen. Status post cholecystectomy. Mild central intrahepatic and common bile duct dilatation, likely related to post cholecystectomy state. Pancreas: Unremarkable. No pancreatic ductal dilatation or surrounding inflammatory changes. Spleen: Normal in size without focal abnormality. Adrenals/Urinary Tract: Adrenal glands are unremarkable. Kidneys are normal, without renal calculi, focal lesion, or hydronephrosis. Bladder is unremarkable. Stomach/Bowel: Stomach is within normal limits. Appendix appears normal. No evidence of bowel wall thickening, distention, or inflammatory changes. Vascular/Lymphatic: Aortic atherosclerosis. No enlarged abdominal or pelvic lymph nodes. Reproductive: Prior hysterectomy. 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 Other: Tiny fat containing umbilical hernia. No free fluid or pneumoperitoneum. Musculoskeletal: No acute or significant osseous findings. 3 mm anterolisthesis at L4-L5 due to severe facet arthropathy. Which IMPRESSION: 1.  No acute intra-abdominal process. 2. Confluent ground-glass density and inter and intralobular septal thickening at the right greater than left lung bases, consistent with crazy paving pattern. While this pattern is nonspecific, given the sparing of the subpleural lung, this may  be related to cryptogenic organizing pneumonia. Pulmonology consultation is recommended. 3. 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 Electronically Signed   By: Titus Dubin M.D.   On: 02/12/2018 20:01   Dg Chest 1 View  Result Date: 02/14/2018 CLINICAL DATA:  Shortness of breath EXAM: CHEST  1 VIEW COMPARISON:  02/12/2018 FINDINGS: Severe diffuse bilateral disease throughout the lungs, stable. Cardiomegaly. No visible effusions or acute bony abnormality. IMPRESSION: Severe bilateral airspace disease could reflect edema/CHF or infection. No change. Electronically Signed   By: Rolm Baptise M.D.   On: 02/14/2018 09:38   Dg Chest Port 1 View  Result Date: 02/12/2018 CLINICAL DATA:  Hypoxia. EXAM: PORTABLE CHEST 1 VIEW COMPARISON:  Chest x-ray dated December 10, 2011. FINDINGS: The heart size and mediastinal contours are within normal limits. Normal pulmonary vascularity. Confluent airspace disease involving the right central lung and left perihilar lung. No pleural effusion or pneumothorax. No acute osseous abnormality. IMPRESSION: 1. Confluent airspace disease in the right greater than left lungs. Electronically Signed   By: Titus Dubin M.D.   On: 02/12/2018 18:53    Review of Systems  Constitutional: Positive for chills and malaise/fatigue. Negative for diaphoresis, fever and weight loss.  HENT: Negative.   Eyes: Negative.   Respiratory: Positive for cough, sputum production and shortness of breath. Negative for hemoptysis  and wheezing.   Cardiovascular: Negative for chest pain, palpitations, orthopnea and leg swelling.  Gastrointestinal: Positive for abdominal pain, nausea and vomiting. Negative for blood in stool, constipation and diarrhea.  Genitourinary: Negative.   Musculoskeletal: Negative.   Skin: Negative.   Neurological: Positive for headaches. Negative for tingling, tremors and sensory change.   Blood pressure (!) 149/75, pulse 76, temperature 98.1 F (36.7 C), temperature source  Oral, resp. rate (!) 26, height 5\' 1"  (1.549 m), weight 72.2 kg (159 lb 2.8 oz), SpO2 97 %. Physical Exam  Nursing note and vitals reviewed. Constitutional: She is oriented to person, place, and time. She appears well-developed and well-nourished. No distress.  HENT:  Head: Normocephalic and atraumatic.  Mouth/Throat: Oropharynx is clear and moist. No oropharyngeal exudate.  Eyes: Pupils are equal, round, and reactive to light. Conjunctivae are normal. No scleral icterus.  Neck: Normal range of motion. Neck supple. No JVD present.  Cardiovascular: Normal rate, regular rhythm and normal heart sounds.  No murmur heard. Respiratory: Effort normal. She has rales.  On oxygen via nasal cannula.  Fine rales bilaterally.  GI: Soft. Bowel sounds are normal. There is no tenderness. There is no rebound and no guarding.  Musculoskeletal: She exhibits no edema or tenderness.  Neurological: She is alert and oriented to person, place, and time.  Skin: Skin is warm and dry. No rash noted. No erythema.    Assessment/Plan: 1.  Acute kidney injury: Appears to be hemodynamically mediated with recent sequence of events as well as preceding heavy use of nonsteroidal anti-inflammatory drugs.  Renal ultrasound is pending but CT scan did not show any obstruction-reassuring as her fractional excretion of sodium was significantly elevated at 6%.  She appears to be euvolemic to hypovolemic on physical exam and I will put her on isotonic sodium bicarbonate drip to try and correct associated metabolic abnormalities of acidosis/hyperkalemia.  I will check ANCA and anti-GBM antibody given the presence of hematuria (although she does not have hemoptysis/evidence of pulmonary hemorrhage).  We will check an antinuclear antibody.  At this time, she does not have any acute indications for dialysis but remains high risk if she does not show sufficient renal recovery and continues to have persistent hyperkalemia. 2.  Anion gap  metabolic acidosis: Secondary to acute kidney injury, begin isotonic sodium bicarbonate. 3.  Hyperkalemia: Secondary to acute kidney injury, status post treatment with Kayexalate and will start isotonic sodium bicarbonate drip. 4.  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 5.  Anemia: With significant iron deficiency, will treat with intravenous iron once infection control obtained.  No indications for PRBC transfusion.  No overt loss. 6.  History of polysubstance abuse: Discussed risks of ongoing cocaine use and encourage cessation.  Louisa Favaro K. 02/14/2018, 10:46 AM

## 2018-02-14 NOTE — Progress Notes (Signed)
Pt O2 saturation fluctuating into the mid to high 80's on 6 L [place], pt placed on 8 L HFNC with O2 saturation maintaining in the 90's. On call MD made aware.

## 2018-02-14 NOTE — Progress Notes (Signed)
Patient 's son Harrie Jeans brought in her cell phone and wallet.  They are at the bedside.  She has been educated regarding her responsibility if it is kept at the bedside.  She agrees to be responsible for that.  Patient has pulled out 2 IVs today.  She has been agreeable to have them replaced when we educate regarding the need for IV fluids and medication that help with kidney function.

## 2018-02-14 NOTE — Progress Notes (Addendum)
PROGRESS NOTE    Rhonda Lynch  FTD:[id number] DOB: 11-Feb-1956 DOA: 02/12/2018 PCP: Patient, No Pcp Per    Brief Narrative:  62 year old female who presented with epigastric abdominal pain.  He does have significant past medical history for alcohol and cocaine abuse.  Reported 1 week of epigastric abdominal pain, stabbing in nature, severe intensity, associated with nausea.  Lately had noted dyspnea and productive cough.  On the initial physical examination she was hypoxic down to 80%, tachypneic with respirate in the 30s, her heart rate was 89 with blood pressure 161/88, temperature 99.3.  She had rhonchi bilaterally, no wheezing, heart S1-S2 present and rhythmic, tachycardic, her abdomen was soft nontender, no lower extremity edema.  Sodium 135, potassium 4.8, chloride 106, bicarb 19, glucose 134, BUN 16, creatinine 7.5, white count 12.0.  Urinalysis with specific gravity 1.0 130, protein 100, positive granular casts, hyaline casts,k greater than 50 red cells, 0-5 white cells.  Urine sodium 85, Streptococcus pneumoniae urine antigen positive.  Drug screen positive for cocaine.  Chest x-ray with bilateral alveolar infiltrates, right lower lobe and left upper lobe.  EKG normal sinus rhythm, normal intervals, normal axis.  Patient was admitted to the hospital working diagnosis sepsis due to bilateral community-acquired pneumonia/aspiration, complicated by acute hypoxic respiratory failure and acute kidney injury.   Assessment & Plan:   Principal Problem:   PNEUMONIA Active Problems:   ELEVATED BLOOD PRESSURE WITHOUT DIAGNOSIS OF HYPERTENSION   Renal failure   Acute respiratory failure with hypoxia (HCC)   Alcohol abuse   Microcytic anemia   Cocaine abuse (HCC)   Epigastric pain   Pneumonia   1. Sepsis due to bilateral pneumonia/ multilobar./ aspiration/ present on admission. Due to streptococcus pneumonia. Patient has remained hemodynamically stable, blood pressure systolic 062 to 376  mmHg. Will continue antibiotic therapy with IV ceftriaxone, discontinue azithromycin. Continue IV fluids with half normal saline, at 75 ml per hour, patient is 3,360 ml of positive fluid balance, avoid volume overload.   2. Acute hypoxic respiratory failure. Worsening hypoxic respiratory failure, patient now on high flow nasal cannula, 8 lpm with oxygen saturation 94% . Will check chest x ray this am, may need diuresis if signs of volume overload. Will continue oxymetry monitoring. Continue systemic steroids for severe community acquired pneumonia.   3. Olyguric AKI due to sepsis/ to rule out ATN, on suspected CKD (unspecified stage) complicated with hyperkalemia and non gap metabolic acidosis. Worsening renal function per serum cr, today at 7,72 with hyperkalemia at 6.2 and non gap metabolic acidosis with serum bicarbonate at 16. Her admission urine analysis had proteinuria, low specific gravity with high urine Na, along with granular casts and hematuria. Will order kayexalate, D50 and IV insulin to correct hyperkalemia, add tid bicitra, continue hypotonic saline to prevent hyperchloremia. Urine output at 725 ml over last 24 hours. May need diuresis for volume overload. Follow renal panel this pm. Will consult nephrology, patient may need renal replacement therapy if continue  worsening renal function. Check renal US to complete workup.   4. Anemia of iron deficiency on anemia of chronic renal disease. No signs of active bleeding, patient had one unit prbc, will follow on cell count in am. Will plan for IV iron before discharge.   5. Alcohol and cocaine abuse. This am with no signs of active withdrawal, will continue benzodiazepines per protocol. Continue neuro checks per unit protocol. Had total of 3 mg of lorazepam since admission.   6. Alcohol induced gastritis. Will continue  antiacid therapy with IV famotidine, continue IV analgesics and as needed antiemetics.    DVT prophylaxis: scd  Code  Status: full Family Communication: no family at the bedside   Disposition Plan: step down unit   Consultants:     Procedures:     Antimicrobials:    Ceftriaxone   Subjective: Patient with persistent dyspnea, moderate in intensity, increase oxygen requirements overnight, no nausea or vomiting, no chest pain. Abdominal pain has improved, no anxiety or tremors.   Objective: Vitals:   02/14/18 0500 02/14/18 0600 02/14/18 0610 02/14/18 0620  BP:      Pulse: 78 71 67 81  Resp: (!) 26     Temp:      TempSrc:      SpO2: 92% (!) 89% 92% 94%  Weight:      Height:        Intake/Output Summary (Last 24 hours) at 02/14/2018 0843 Last data filed at 02/14/2018 0600 Gross per 24 hour  Intake 2203.42 ml  Output 725 ml  Net 1478.42 ml   Filed Weights   02/13/18 1813 02/14/18 0423  Weight: 70.1 kg (154 lb 8.7 oz) 72.2 kg (159 lb 2.8 oz)    Examination:   General: deconditioned and ill looking appearing Neurology: Awake and alert, non focal  E ENT: mild pallor, no icterus, oral mucosa dry Cardiovascular: No JVD. S1-S2 present, rhythmic, no gallops, rubs, or murmurs. Trace lower extremity edema. Pulmonary: decreased breath sounds bilaterally, adequate air movement, no wheezing, positive rhonchi and rales, bilaterally Gastrointestinal. Abdomen with no organomegaly, non tender, no rebound or guarding Skin. No rashes Musculoskeletal: no joint deformities     Data Reviewed: I have personally reviewed following labs and imaging studies  CBC: Recent Labs  Lab 02/12/18 1534 02/13/18 0421 02/13/18 1153 02/14/18 0333  WBC 12.0* 13.5* 12.7* 12.6*  NEUTROABS  --   --   --  11.1*  HGB 7.3* 6.7* 8.1* 7.7*  HCT 23.4* 21.4* 25.5* 24.5*  MCV 71.6* 72.8* 76.1* 77.8*  PLT 306 272 259 315   Basic Metabolic Panel: Recent Labs  Lab 02/12/18 1534 02/13/18 0421 02/13/18 1433 02/14/18 0333  NA 135 137 138 135  K 4.8 5.9* 5.4* 6.2*  CL 106 112* 111 111  CO2 19* 17* 19* 16*   GLUCOSE 134* 109* 113* 214*  BUN 61* 62* 63* 71*  CREATININE 7.58* 7.23* 7.32* 7.72*  CALCIUM 7.7* 7.2* 7.4* 7.7*   GFR: Estimated Creatinine Clearance: 6.7 mL/min (A) (by C-G formula based on SCr of 7.72 mg/dL (H)). Liver Function Tests: Recent Labs  Lab 02/12/18 1534  AST 21  ALT 11*  ALKPHOS 53  BILITOT 0.9  PROT 7.7  ALBUMIN 2.3*   Recent Labs  Lab 02/12/18 1534  LIPASE 40   No results for input(s): AMMONIA in the last 168 hours. Coagulation Profile: Recent Labs  Lab 02/12/18 1947  INR 1.07   Cardiac Enzymes: Recent Labs  Lab 02/12/18 1801  TROPONINI <0.03   BNP (last 3 results) No results for input(s): PROBNP in the last 8760 hours. HbA1C: No results for input(s): HGBA1C in the last 72 hours. CBG: Recent Labs  Lab 02/13/18 0752 02/13/18 1028 02/13/18 1711  GLUCAP 115* 147* 87   Lipid Profile: No results for input(s): CHOL, HDL, LDLCALC, TRIG, CHOLHDL, LDLDIRECT in the last 72 hours. Thyroid Function Tests: No results for input(s): TSH, T4TOTAL, FREET4, T3FREE, THYROIDAB in the last 72 hours. Anemia Panel: Recent Labs    02/12/18 2344  VITAMINB12  347  FOLATE 12.8  FERRITIN 419*  TIBC 255  IRON 34  RETICCTPCT 2.7      Radiology Studies: I have reviewed all of the imaging during this hospital visit personally     Scheduled Meds: . dextrose  1 ampule Intravenous Once  . folic acid  1 mg Oral Daily  . heparin  5,000 Units Subcutaneous Q8H  . insulin aspart  10 Units Intravenous Once  . LORazepam  0-4 mg Oral Q6H   Followed by  . LORazepam  0-4 mg Oral Q12H  . methylPREDNISolone (SOLU-MEDROL) injection  40 mg Intravenous Daily  . multivitamin with minerals  1 tablet Oral Daily  . sodium chloride flush  3 mL Intravenous Q12H  . sodium citrate-citric acid  30 mL Oral TID PC & HS  . sodium polystyrene  30 g Oral Q4H  . thiamine  100 mg Oral Daily   Or  . thiamine  100 mg Intravenous Daily   Continuous Infusions: . sodium chloride  75 mL/hr at 02/14/18 0546  . cefTRIAXone (ROCEPHIN)  IV Stopped (02/13/18 1840)  . famotidine (PEPCID) IV Stopped (02/13/18 2142)     LOS: 2 days        Shikara Mcauliffe Gerome Apley, MD [facility] Pager [phone]

## 2018-02-14 NOTE — Progress Notes (Signed)
Patient has been in and out of bed multiple times today and having BMs in bed as well.  She is able to transfer to [facility].  Tonight after transfering back to bed after a BM she began to c/o headache and 2 Norco given.  Patient now resting in bed, call bell in reach, states understanding, but needs reinforcement of education to call nurse when she needs to go to the bathroom.

## 2018-02-15 DIAGNOSIS — D649 Anemia, unspecified: Secondary | ICD-10-CM

## 2018-02-15 LAB — BASIC METABOLIC PANEL
Anion gap: 11 (ref 5–15)
BUN: 74 mg/dL — ABNORMAL HIGH (ref 6–20)
CHLORIDE: 103 mmol/L (ref 101–111)
CO2: 26 mmol/L (ref 22–32)
CREATININE: 7.7 mg/dL — AB (ref 0.44–1.00)
Calcium: 7.1 mg/dL — ABNORMAL LOW (ref 8.9–10.3)
GFR calc non Af Amer: 5 mL/min — ABNORMAL LOW (ref >60)
GFR, EST AFRICAN AMERICAN: 6 mL/min — AB (ref >60)
Glucose, Bld: 171 mg/dL — ABNORMAL HIGH (ref 65–99)
POTASSIUM: 4.6 mmol/L (ref 3.5–5.1)
SODIUM: 140 mmol/L (ref 135–145)

## 2018-02-15 LAB — CBC WITH DIFFERENTIAL/PLATELET
BASOS PCT: 0 %
Basophils Absolute: 0 10*3/uL (ref 0.0–0.1)
EOS PCT: 0 %
Eosinophils Absolute: 0 10*3/uL (ref 0.0–0.7)
HEMATOCRIT: 21 % — AB (ref 36.0–46.0)
HEMOGLOBIN: 6.8 g/dL — AB (ref 12.0–15.0)
Lymphocytes Relative: 7 %
Lymphs Abs: 1 10*3/uL (ref 0.7–4.0)
MCH: 24.2 pg — AB (ref 26.0–34.0)
MCHC: 32.4 g/dL (ref 30.0–36.0)
MCV: 74.7 fL — AB (ref 78.0–100.0)
MONO ABS: 0.6 10*3/uL (ref 0.1–1.0)
MONOS PCT: 4 %
NEUTROS PCT: 89 %
Neutro Abs: 12.6 10*3/uL — ABNORMAL HIGH (ref 1.7–7.7)
Platelets: 274 10*3/uL (ref 150–400)
RBC: 2.81 MIL/uL — AB (ref 3.87–5.11)
RDW: 18.1 % — ABNORMAL HIGH (ref 11.5–15.5)
WBC: 14.2 10*3/uL — AB (ref 4.0–10.5)

## 2018-02-15 LAB — PREPARE RBC (CROSSMATCH)

## 2018-02-15 LAB — MPO/PR-3 (ANCA) ANTIBODIES
ANCA Proteinase 3: <3.5 U/mL (ref 0.0–3.5)
Myeloperoxidase Abs: 44.7 U/mL — ABNORMAL HIGH (ref 0.0–9.0)

## 2018-02-15 MED ORDER — SODIUM CHLORIDE 0.9 % IV SOLN
Freq: Once | INTRAVENOUS | Status: AC
  Administered 2018-02-15: 05:00:00 via INTRAVENOUS

## 2018-02-15 MED ORDER — FUROSEMIDE 10 MG/ML IJ SOLN
80.0000 mg | Freq: Once | INTRAMUSCULAR | Status: AC
  Administered 2018-02-15: 80 mg via INTRAVENOUS
  Filled 2018-02-15: qty 8

## 2018-02-15 MED ORDER — SODIUM CHLORIDE 0.9 % IV SOLN: Freq: Once | INTRAVENOUS | Status: DC

## 2018-02-15 MED ORDER — SODIUM CHLORIDE 0.9 % IV SOLN
Freq: Once | INTRAVENOUS | Status: AC
  Administered 2018-02-15: 11:00:00 via INTRAVENOUS

## 2018-02-15 NOTE — Progress Notes (Addendum)
PROGRESS NOTE    Rhonda Lynch  HQI:[id number] DOB: [birth year] DOA: 02/12/2018 PCP: Patient, No Pcp Per    Brief Narrative:  62 year old female who presented with epigastric abdominal pain. He does have significant past medical history for alcohol and cocaine abuse. Reported 1 week of epigastric abdominal pain, stabbing in nature, severe intensity, associated with nausea.Lately had noted dyspnea and productive cough. On the initial physical examination she was hypoxic down to 80%, tachypneic with respirate in the 30s, her heart rate was 89 with blood pressure 161/88, temperature 99.3. She had rhonchi bilaterally, no wheezing, heart S1-S2 present and rhythmic, tachycardic, her abdomen was soft nontender, no lower extremity edema. Sodium 135, potassium 4.8, chloride 106, bicarb 19, glucose 134, BUN 16, creatinine 7.5,white count 12.0.Urinalysis with specific gravity 1.0 130, protein 100, positive granular casts, hyaline casts,kgreater than 50 red cells,0-5 white cells.Urine sodium 85, Streptococcus pneumoniae urine antigen positive.Drug screen positive for cocaine. Chest x-ray with bilateral alveolar infiltrates, right lower lobe and left upper lobe. EKG normal sinus rhythm, normal intervals, normal axis.  Patient was admitted to the hospital working diagnosis sepsis due to bilateral community-acquired pneumonia/aspiration, complicated by acute hypoxic respiratory failure and acute kidney injury.    Assessment & Plan:   Principal Problem:   PNEUMONIA Active Problems:   ELEVATED BLOOD PRESSURE WITHOUT DIAGNOSIS OF HYPERTENSION   Renal failure   Acute respiratory failure with hypoxia (HCC)   Alcohol abuse   Microcytic anemia   Cocaine abuse (HCC)   Epigastric pain   Pneumonia  1. Sepsis due to bilateral pneumonia/ multilobar./ aspiration/ present on admission. Due to streptococcus pneumonia. Blood pressure systolic 132 to 440 mmHg. Patient has remained afebrile, wbc at 14,  blood cultures with no growth. Fluid balance positive 5,817 ml since admission. Had one unit prbc this am. Positive JVD on exam today, no significant lower extremity edema. Continue systemic steroids and antibiotic therapy with IV ceftriaxone.   2. Acute hypoxic respiratory failure. lncreases oxygen requirements to 10 L per minute per high flow nasal cannular, will continue oxymetry monitoring. Avoid volume overload.   3. Olyguric AKI due to sepsis/ to rule out ATN, on suspected CKD (unspecified stage) complicated with hyperkalemia and non gap metabolic acidosis. Renal function with stable cr at 7,7 with K at 4,6 and serum bicarbonate at 26, bicarb infusion will be completed today at 11:00. Urine output still low, over last 24 hours 300 ml, decreased from yesterday 725 ml, patient with early signs of hypervolemia, (positive fluid balance and JVD), increased oxygen requirements may need diuresis. Will continue to follow urine output and renal function. Acidosis has been corrected and improved serum K. Renal US with signs of CKD. Continue patiromer (3 doses).  4. Anemia of iron deficiency on anemia of chronic renal disease. Now sp 2 units prbc transfused, will follow cell count in am, IV iron infusion before discharge.   5. Alcohol and cocaine abuse. Had one mg of lorazepam last night  per protocol, will continue neuro checks, tolerating po well. Will consult physical therapy, out of bed as tolerated.   6. Alcohol induced gastritis. Continue IV famotidine, avoid proton pump inhibitors due to AKI, continue IV analgesics and as needed IV antiemetics. Tolerating po well with no nausea or vomiting.    DVT prophylaxis:scd Code Status:full Family Communication:no family at the bedside   Disposition Plan:step down unit   Consultants:    Procedures:    Antimicrobials:  Ceftriaxone    Subjective: Patient reports stable dyspnea and  abdominal pain, overall feeling better,  no nausea or vomiting, no chest pain or palpitations.   Objective: Vitals:   02/15/18 0454 02/15/18 0500 02/15/18 0517 02/15/18 0745  BP: (!) 143/86  (!) 162/85 (!) (P) 154/99  Pulse: 79  79 (P) 88  Resp: (!) 27  (!) 29 (!) (P) 24  Temp: 98.3 F (36.8 C)  (!) 97.4 F (36.3 C) (P) 98.5 F (36.9 C)  TempSrc: Oral  Oral (P) Oral  SpO2: 94%   (P) 96%  Weight:  73.2 kg (161 lb 6 oz)    Height:        Intake/Output Summary (Last 24 hours) at 02/15/2018 0802 Last data filed at 02/15/2018 0504 Gross per 24 hour  Intake 2757.5 ml  Output 300 ml  Net 2457.5 ml   Filed Weights   02/13/18 1813 02/14/18 0423 02/15/18 0500  Weight: 70.1 kg (154 lb 8.7 oz) 72.2 kg (159 lb 2.8 oz) 73.2 kg (161 lb 6 oz)    Examination:   General: deconditioned, ill looking appearing and dyspneic. Neurology: Awake and alert, non focal  E ENT: mid pallor, no icterus, oral mucosa moist Cardiovascular: Moderate JVD. S1-S2 present, rhythmic, no gallops, rubs, or murmurs. Trace lower extremity edema. Pulmonary: decreased breath sounds bilaterally at bases, no wheezing, rhonchi or rales. Gastrointestinal. Abdomen with no organomegaly, non tender, no rebound or guarding Skin. No rashes Musculoskeletal: no joint deformities     Data Reviewed: I have personally reviewed following labs and imaging studies  CBC: Recent Labs  Lab 02/12/18 1534 02/13/18 0421 02/13/18 1153 02/14/18 0333 02/15/18 0342  WBC 12.0* 13.5* 12.7* 12.6* 14.2*  NEUTROABS  --   --   --  11.1* 12.6*  HGB 7.3* 6.7* 8.1* 7.7* 6.8*  HCT 23.4* 21.4* 25.5* 24.5* 21.0*  MCV 71.6* 72.8* 76.1* 77.8* 74.7*  PLT 306 272 259 261 657   Basic Metabolic Panel: Recent Labs  Lab 02/13/18 0421 02/13/18 1433 02/14/18 0333 02/14/18 1559 02/15/18 0342  NA 137 138 135 137 140  K 5.9* 5.4* 6.2* 5.6* 4.6  CL 112* 111 111 109 103  CO2 17* 19* 16* 18* 26  GLUCOSE 109* 113* 214* 200* 171*  BUN 62* 63* 71* 73* 74*  CREATININE 7.23* 7.32* 7.72*  7.85* 7.70*  CALCIUM 7.2* 7.4* 7.7* 7.9* 7.1*   GFR: Estimated Creatinine Clearance: 6.8 mL/min (A) (by C-G formula based on SCr of 7.7 mg/dL (H)). Liver Function Tests: Recent Labs  Lab 02/12/18 1534  AST 21  ALT 11*  ALKPHOS 53  BILITOT 0.9  PROT 7.7  ALBUMIN 2.3*   Recent Labs  Lab 02/12/18 1534  LIPASE 40   No results for input(s): AMMONIA in the last 168 hours. Coagulation Profile: Recent Labs  Lab 02/12/18 1947  INR 1.07   Cardiac Enzymes: Recent Labs  Lab 02/12/18 1801 02/14/18 1115  CKTOTAL  --  210  TROPONINI <0.03  --    BNP (last 3 results) No results for input(s): PROBNP in the last 8760 hours. HbA1C: No results for input(s): HGBA1C in the last 72 hours. CBG: Recent Labs  Lab 02/13/18 0752 02/13/18 1028 02/13/18 1711 02/14/18 1028  GLUCAP 115* 147* 87 207*   Lipid Profile: No results for input(s): CHOL, HDL, LDLCALC, TRIG, CHOLHDL, LDLDIRECT in the last 72 hours. Thyroid Function Tests: No results for input(s): TSH, T4TOTAL, FREET4, T3FREE, THYROIDAB in the last 72 hours. Anemia Panel: Recent Labs    02/12/18 2344  VITAMINB12 347  FOLATE 12.8  FERRITIN 419*  TIBC 255  IRON 34  RETICCTPCT 2.7      Radiology Studies: I have reviewed all of the imaging during this hospital visit personally     Scheduled Meds: . folic acid  1 mg Oral Daily  . heparin  5,000 Units Subcutaneous Q8H  . LORazepam  0-4 mg Oral Q12H  . methylPREDNISolone (SOLU-MEDROL) injection  40 mg Intravenous Daily  . multivitamin with minerals  1 tablet Oral Daily  . patiromer  8.4 g Oral Daily  . sodium chloride flush  3 mL Intravenous Q12H  . thiamine  100 mg Oral Daily   Or  . thiamine  100 mg Intravenous Daily   Continuous Infusions: . sodium chloride    . cefTRIAXone (ROCEPHIN)  IV Stopped (02/14/18 1921)  . famotidine (PEPCID) IV    .  sodium bicarbonate (isotonic) infusion in sterile water Stopped (02/15/18 0504)     LOS: 3 days         Mauricio Gerome Apley, MD [facility] Pager [phone]

## 2018-02-15 NOTE — Progress Notes (Signed)
CRITICAL VALUE ALERT  Critical Value:  Hemoglobin 6.8  Date & Time Notied: 6/8 & 0406    Provider Notified: [place]  Will continue to monitor pt and wait for new orders.

## 2018-02-15 NOTE — Progress Notes (Signed)
PT Cancellation Note  Patient Details Name: Rhonda Lynch MRN: [id number] DOB: Sep 23, 1955   Cancelled Treatment:    Reason Eval/Treat Not Completed: Patient not medically ready. RN asked to hold as pt still receiving blood and will not be done until 2pm. Pt also very hypoxic. PT to return as able to complete PT eval.  Kittie Plater, PT, DPT Pager #: [phone] Office #: [phone]    [place] 02/15/2018, 1:22 PM

## 2018-02-15 NOTE — Evaluation (Signed)
Physical Therapy Evaluation Patient Details Name: Rhonda Lynch MRN: [id number] DOB: 06/02/1956 Today's Date: 02/15/2018   History of Present Illness  Pt is a 62 yo female presenting with epigastric pain and was tachypneic. PMH: cocaine and alcohol abuse. Pt found to have PNA, microctic anemia, renal failurs and HTN urgency.  Clinical Impression  Pt admitted with above. Pt was indep before but now limited by SOB with all activity. SpO2 dec to 82% on 8LO2 via [place]. RN aware. At this time pt to benefit from ST-SNF as pt unsteady and at increased falls risk. If patients SpO2 saturations improve patients functional mobility may progress. PT to continue to follow and assess d/c planning.    Follow Up Recommendations SNF;Supervision/Assistance - 24 hour    Equipment Recommendations  (TBD)    Recommendations for Other Services       Precautions / Restrictions Precautions Precautions: Fall Precaution Comments: watch O2 Restrictions Weight Bearing Restrictions: No      Mobility  Bed Mobility Overal bed mobility: Needs Assistance Bed Mobility: Supine to Sit     Supine to sit: HOB elevated;Min guard     General bed mobility comments: dec SPO2 to 85% on 8LO2 via [place], increased time, inc RR  Transfers Overall transfer level: Needs assistance Equipment used: 1 person hand held assist Transfers: Sit to/from Stand Sit to Stand: Min assist         General transfer comment: pt shaky, minA to steady pt, pt with wide base of support, slow and guarded  Ambulation/Gait Ambulation/Gait assistance: Min assist;Mod assist Ambulation Distance (Feet): 10 Feet(from one side of the bed to the other) Assistive device: 1 person hand held assist(held on to bed railings to walk around) Gait Pattern/deviations: Step-through pattern;Decreased stride length;Trunk flexed;Narrow base of support Gait velocity: slow Gait velocity interpretation: <1.8 ft/sec, indicate of risk for recurrent falls General  Gait Details: pt unsteady, SpO2 dec to 82% on 8LO2 via [place]. Pt to benefit from RW when able to tolerate increased distance  Stairs            Wheelchair Mobility    Modified Rankin (Stroke Patients Only)       Balance Overall balance assessment: Needs assistance Sitting-balance support: Feet supported;No upper extremity supported Sitting balance-Leahy Scale: Good Sitting balance - Comments: pt able to don socks at EOB without difficulty   Standing balance support: Bilateral upper extremity supported Standing balance-Leahy Scale: Poor Standing balance comment: needs support                              Pertinent Vitals/Pain Pain Assessment: 0-10 Pain Score: 4  Pain Location: upper abdomen Pain Descriptors / Indicators: Stabbing Pain Intervention(s): Monitored during session    Home Living Family/patient expects to be discharged to:: Private residence Living Arrangements: Non-relatives/Friends Available Help at Discharge: Friend(s);Available PRN/intermittently Type of Home: House Home Access: Stairs to enter Entrance Stairs-Rails: None Entrance Stairs-Number of Steps: 3 Home Layout: One level Home Equipment: None      Prior Function Level of Independence: Independent         Comments: drove occasionally, did the grocery shopping     Hand Dominance   Dominant Hand: Right    Extremity/Trunk Assessment   Upper Extremity Assessment Upper Extremity Assessment: Generalized weakness    Lower Extremity Assessment Lower Extremity Assessment: Generalized weakness    Cervical / Trunk Assessment Cervical / Trunk Assessment: Normal  Communication   Communication: No  difficulties  Cognition Arousal/Alertness: Awake/alert Behavior During Therapy: Flat affect Overall Cognitive Status: Within Functional Limits for tasks assessed                                        General Comments      Exercises General Exercises - Lower  Extremity Ankle Circles/Pumps: AROM;Both;Supine Quad Sets: AROM;Both;10 reps   Assessment/Plan    PT Assessment Patient needs continued PT services  PT Problem List Cardiopulmonary status limiting activity;Decreased strength;Decreased activity tolerance;Decreased balance;Decreased mobility;Decreased coordination;Decreased knowledge of use of DME;Decreased safety awareness       PT Treatment Interventions DME instruction;Gait training;Stair training;Functional mobility training;Therapeutic activities;Therapeutic exercise;Balance training;Neuromuscular re-education    PT Goals (Current goals can be found in the Care Plan section)  Acute Rehab PT Goals Patient Stated Goal: get better PT Goal Formulation: With patient Time For Goal Achievement: 03/01/18 Potential to Achieve Goals: Good    Frequency Min 3X/week   Barriers to discharge Decreased caregiver support pt states he is around but unsure of accuracy    Co-evaluation               AM-PAC PT "6 Clicks" Daily Activity  Outcome Measure Difficulty turning over in bed (including adjusting bedclothes, sheets and blankets)?: None Difficulty moving from lying on back to sitting on the side of the bed? : None Difficulty sitting down on and standing up from a chair with arms (e.g., wheelchair, bedside commode, etc,.)?: Unable Help needed moving to and from a bed to chair (including a wheelchair)?: A Little Help needed walking in hospital room?: A Little Help needed climbing 3-5 steps with a railing? : A Lot 6 Click Score: 17    End of Session Equipment Utilized During Treatment: Gait belt Activity Tolerance: Patient tolerated treatment well Patient left: in chair;with call bell/phone within reach;with chair alarm set Nurse Communication: Mobility status(SpO2 at 82% on 8LO2 with mobility) PT Visit Diagnosis: Unsteadiness on feet (R26.81);Difficulty in walking, not elsewhere classified (R26.2)    Time: 5929-2446 PT Time  Calculation (min) (ACUTE ONLY): 16 min   Charges:   PT Evaluation $PT Eval Moderate Complexity: 1 Mod     PT G Codes:        Kittie Plater, PT, DPT Pager #: [phone] Office #: [phone]   Jachin Coury M Akeia Perot 02/15/2018, 2:30 PM

## 2018-02-15 NOTE — Progress Notes (Signed)
[name], female   DOB: 10-Jan-1956, 62 y.o.   MRN: [id number] [facility] Progress Note   Assessment/ Plan:   1.  Acute kidney injury:  Poorly known baseline renal function given paucity of data/lack of regular medical follow-up. Pattern of injury suggestive of ATN-possibly multifactorial.  She remains oliguric and at this time, as she gets a blood transfusion, will attempt to augment urine output with diuretics.  With her creatinine remaining relatively unchanged since admission, the question arises as to whether this is the plateau phase of her ATN or whether indeed she has had significant progression of chronic kidney disease to this point.  Renal ultrasound showing features consistent with CKD.  Positive antinuclear antibody but negative anti-double-stranded DNA.  Awaiting ANCA/anti-GBM (ordered because of appearance of chest x-ray/microscopic hematuria). 2.  Anion gap metabolic acidosis: Secondary to acute kidney injury, corrected with isotonic sodium bicarbonate. 3.  Hyperkalemia: Secondary to acute kidney injury, corrected medically, continue renal diet. 4.  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 5.  Anemia: With significant iron deficiency, intravenous iron therapy delayed until infection control optimized.  Will order blood transfusion and give furosemide. 6.  History of polysubstance abuse: Discussed risks of ongoing cocaine use and encourage cessation.  Subjective:   Complains of pain all over her body.  Shortness of breath unchanged.   Objective:   BP (!) 121/99 (BP Location: Right Arm)   Pulse 78   Temp 98.3 F (36.8 C) (Oral)   Resp (!) 24   Ht 5\' 1"  (1.549 m)   Wt 73.2 kg (161 lb 6 oz)   SpO2 98%   BMI 30.49 kg/m   Intake/Output Summary (Last 24 hours) at 02/15/2018 0858 Last data filed at 02/15/2018 0504 Gross per 24 hour  Intake 2757.5 ml  Output 300 ml   Net 2457.5 ml   Weight change: 3.1 kg (6 lb 13.4 oz)  Physical Exam: Gen: Appears to be comfortable resting in bed on oxygen via nasal cannula CVS: Pulse regular rhythm, normal rate, S1 and S2 with ejection systolic murmur Resp: Fine rales bilaterally, no distinct wheeze/rhonchi Abd: Soft, flat, nontender Ext: No lower extremity edema  Imaging: Dg Chest 1 View  Result Date: 02/14/2018 CLINICAL DATA:  Shortness of breath EXAM: CHEST  1 VIEW COMPARISON:  02/12/2018 FINDINGS: Severe diffuse bilateral disease throughout the lungs, stable. Cardiomegaly. No visible effusions or acute bony abnormality. IMPRESSION: Severe bilateral airspace disease could reflect edema/CHF or infection. No change. Electronically Signed   By: Rolm Baptise M.D.   On: 02/14/2018 09:38   US Renal  Result Date: 02/14/2018 CLINICAL DATA:  Acute kidney injury. EXAM: RENAL / URINARY TRACT ULTRASOUND COMPLETE COMPARISON:  CT abdomen pelvis dated February 12, 2018. FINDINGS: Right Kidney: Length: 11.0 cm. Increased echogenicity. No mass or hydronephrosis visualized. Left Kidney: Length: 10.9 cm. Increased echogenicity. No mass or hydronephrosis visualized. Bladder: Decompressed by a Foley catheter. IMPRESSION: Increased renal cortical echogenicity bilaterally, consistent with medical renal disease. Electronically Signed   By: Titus Dubin M.D.   On: 02/14/2018 22:34    Labs: BMET Recent Labs  Lab 02/12/18 1534 02/13/18 0421 02/13/18 1433 02/14/18 0333 02/14/18 1559 02/15/18 0342  NA 135 137 138 135 137 140  K 4.8 5.9* 5.4* 6.2* 5.6* 4.6  CL 106 112* 111 111 109 103  CO2 19* 17* 19* 16* 18* 26  GLUCOSE 134* 109* 113* 214* 200* 171*  BUN 61* 62* 63* 71*  73* 74*  CREATININE 7.58* 7.23* 7.32* 7.72* 7.85* 7.70*  CALCIUM 7.7* 7.2* 7.4* 7.7* 7.9* 7.1*   CBC Recent Labs  Lab 02/13/18 0421 02/13/18 1153 02/14/18 0333 02/15/18 0342  WBC 13.5* 12.7* 12.6* 14.2*  NEUTROABS  --   --  11.1* 12.6*  HGB 6.7* 8.1* 7.7*  6.8*  HCT 21.4* 25.5* 24.5* 21.0*  MCV 72.8* 76.1* 77.8* 74.7*  PLT 272 259 261 274   Medications:    . folic acid  1 mg Oral Daily  . heparin  5,000 Units Subcutaneous Q8H  . LORazepam  0-4 mg Oral Q12H  . methylPREDNISolone (SOLU-MEDROL) injection  40 mg Intravenous Daily  . multivitamin with minerals  1 tablet Oral Daily  . patiromer  8.4 g Oral Daily  . sodium chloride flush  3 mL Intravenous Q12H  . thiamine  100 mg Oral Daily   Or  . thiamine  100 mg Intravenous Daily   Elmarie Shiley, MD 02/15/2018, 8:58 AM

## 2018-02-16 LAB — CBC WITH DIFFERENTIAL/PLATELET
Abs Immature Granulocytes: 0.1 10*3/uL (ref 0.0–0.1)
BASOS ABS: 0 10*3/uL (ref 0.0–0.1)
BASOS PCT: 0 %
EOS ABS: 0 10*3/uL (ref 0.0–0.7)
EOS PCT: 0 %
HEMATOCRIT: 33.2 % — AB (ref 36.0–46.0)
Hemoglobin: 11 g/dL — ABNORMAL LOW (ref 12.0–15.0)
IMMATURE GRANULOCYTES: 1 %
LYMPHS ABS: 1.1 10*3/uL (ref 0.7–4.0)
Lymphocytes Relative: 8 %
MCH: 26.2 pg (ref 26.0–34.0)
MCHC: 33.1 g/dL (ref 30.0–36.0)
MCV: 79 fL (ref 78.0–100.0)
Monocytes Absolute: 0.6 10*3/uL (ref 0.1–1.0)
Monocytes Relative: 4 %
NEUTROS PCT: 87 %
Neutro Abs: 12 10*3/uL — ABNORMAL HIGH (ref 1.7–7.7)
PLATELETS: 283 10*3/uL (ref 150–400)
RBC: 4.2 MIL/uL (ref 3.87–5.11)
RDW: 18.6 % — AB (ref 11.5–15.5)
WBC: 13.7 10*3/uL — AB (ref 4.0–10.5)

## 2018-02-16 LAB — TYPE AND SCREEN
ABO/RH(D): A POS
ANTIBODY SCREEN: NEGATIVE
UNIT DIVISION: 0
UNIT DIVISION: 0
Unit division: 0

## 2018-02-16 LAB — BASIC METABOLIC PANEL
ANION GAP: 13 (ref 5–15)
BUN: 79 mg/dL — ABNORMAL HIGH (ref 6–20)
CALCIUM: 7.2 mg/dL — AB (ref 8.9–10.3)
CO2: 26 mmol/L (ref 22–32)
Chloride: 101 mmol/L (ref 101–111)
Creatinine, Ser: 7.83 mg/dL — ABNORMAL HIGH (ref 0.44–1.00)
GFR, EST AFRICAN AMERICAN: 6 mL/min — AB (ref >60)
GFR, EST NON AFRICAN AMERICAN: 5 mL/min — AB (ref >60)
Glucose, Bld: 190 mg/dL — ABNORMAL HIGH (ref 65–99)
Potassium: 4.3 mmol/L (ref 3.5–5.1)
Sodium: 140 mmol/L (ref 135–145)

## 2018-02-16 LAB — BPAM RBC
Blood Product Expiration Date: 201906252359
Blood Product Expiration Date: 201907012359
Blood Product Expiration Date: 201907012359
ISSUE DATE / TIME: 201906060544
ISSUE DATE / TIME: 201906080439
ISSUE DATE / TIME: 201906081045
Unit Type and Rh: 6200
Unit Type and Rh: 6200
Unit Type and Rh: 6200

## 2018-02-16 LAB — GLUCOSE, CAPILLARY: Glucose-Capillary: 137 mg/dL — ABNORMAL HIGH (ref 65–99)

## 2018-02-16 MED ORDER — METHYLPREDNISOLONE SODIUM SUCC 40 MG IJ SOLR
20.0000 mg | Freq: Every day | INTRAMUSCULAR | Status: DC
  Administered 2018-02-16 – 2018-02-17 (×2): 20 mg via INTRAVENOUS
  Filled 2018-02-16 (×3): qty 1

## 2018-02-16 MED ORDER — FAMOTIDINE 20 MG PO TABS
20.0000 mg | ORAL_TABLET | Freq: Two times a day (BID) | ORAL | Status: DC
  Administered 2018-02-16 – 2018-02-17 (×2): 20 mg via ORAL
  Filled 2018-02-16 (×2): qty 1

## 2018-02-16 MED ORDER — ZOLPIDEM TARTRATE 5 MG PO TABS
5.0000 mg | ORAL_TABLET | Freq: Every evening | ORAL | Status: DC | PRN
  Administered 2018-02-16 – 2018-03-14 (×21): 5 mg via ORAL
  Filled 2018-02-16 (×22): qty 1

## 2018-02-16 NOTE — Progress Notes (Addendum)
[name], female   DOB: 03/25/1956, 62 y.o.   MRN: [id number] [facility] Progress Note   Assessment/ Plan:   1.  Acute kidney injury:  Poorly known baseline renal function given paucity of data/lack of regular medical follow-up.  Initially suspected ATN-possibly multifactorial with seemingly prolonged plateau phase however, indeed may have had progression of chronic kidney disease and this may be her baseline.  Concern raised by positive myeloperoxidase antibody which may be induced by previous history of cocaine use versus nonspecific crossover antibody.  Will order for renal biopsy for diagnostic/prognostic reasons to evaluate for possible microscopic polyangiitis/RPGN-with her presentation creatinine, high risk for ESRD.  No acute indications for hemodialysis at this time but I discussed with and sensitized the patient to her high risk situation.  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  2.  Anion gap metabolic acidosis: Secondary to acute kidney injury, corrected with sodium bicarb. 3.  Hyperkalemia: Secondary to acute kidney injury, corrected medically, continue renal diet. 4.  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 5.  Anemia: Status post PRBC transfusion yesterday with significant improvement of hemoglobin-initial low value may have been false. 6.  History of polysubstance abuse: Discussed risks of ongoing cocaine use and encourage cessation.  Subjective:   Other than intermittent pain over various areas, she reports that she feels that she is improving with regards to her shortness of breath.   Objective:   BP (!) 154/81 (BP Location: Left Arm)   Pulse 82   Temp 97.8 F (36.6 C) (Oral)   Resp (!) 22   Ht 5\' 1"  (1.549 m)   Wt 73.2 kg (161 lb 6 oz)   SpO2 97%   BMI 30.49 kg/m   Intake/Output Summary (Last 24 hours) at 02/16/2018 0831 Last data  filed at 02/15/2018 1845 Gross per 24 hour  Intake 640 ml  Output 200 ml  Net 440 ml   Weight change:   Physical Exam: Gen: Appears to be comfortable resting in bed on oxygen via nasal cannula CVS: Pulse regular rhythm, normal rate, S1 and S2 with ejection systolic murmur Resp: Fine rales bilaterally, no distinct wheeze/rhonchi Abd: Soft, flat, nontender Ext: No lower extremity edema  Imaging: Dg Chest 1 View  Result Date: 02/14/2018 CLINICAL DATA:  Shortness of breath EXAM: CHEST  1 VIEW COMPARISON:  02/12/2018 FINDINGS: Severe diffuse bilateral disease throughout the lungs, stable. Cardiomegaly. No visible effusions or acute bony abnormality. IMPRESSION: Severe bilateral airspace disease could reflect edema/CHF or infection. No change. Electronically Signed   By: Rolm Baptise M.D.   On: 02/14/2018 09:38   US Renal  Result Date: 02/14/2018 CLINICAL DATA:  Acute kidney injury. EXAM: RENAL / URINARY TRACT ULTRASOUND COMPLETE COMPARISON:  CT abdomen pelvis dated February 12, 2018. FINDINGS: Right Kidney: Length: 11.0 cm. Increased echogenicity. No mass or hydronephrosis visualized. Left Kidney: Length: 10.9 cm. Increased echogenicity. No mass or hydronephrosis visualized. Bladder: Decompressed by a Foley catheter. IMPRESSION: Increased renal cortical echogenicity bilaterally, consistent with medical renal disease. Electronically Signed   By: Titus Dubin M.D.   On: 02/14/2018 22:34    Labs: BMET Recent Labs  Lab 02/12/18 1534 02/13/18 0421 02/13/18 1433 02/14/18 0333 02/14/18 1559 02/15/18 0342 02/16/18 0233  NA 135 137 138 135 137 140 140  K 4.8 5.9* 5.4* 6.2* 5.6* 4.6 4.3  CL 106 112* 111 111 109 103 101  CO2 19* 17* 19* 16* 18*  26 26  GLUCOSE 134* 109* 113* 214* 200* 171* 190*  BUN 61* 62* 63* 71* 73* 74* 79*  CREATININE 7.58* 7.23* 7.32* 7.72* 7.85* 7.70* 7.83*  CALCIUM 7.7* 7.2* 7.4* 7.7* 7.9* 7.1* 7.2*   CBC Recent Labs  Lab 02/13/18 1153 02/14/18 0333 02/15/18 0342  02/16/18 0233  WBC 12.7* 12.6* 14.2* 13.7*  NEUTROABS  --  11.1* 12.6* 12.0*  HGB 8.1* 7.7* 6.8* 11.0*  HCT 25.5* 24.5* 21.0* 33.2*  MCV 76.1* 77.8* 74.7* 79.0  PLT 259 261 274 283   Medications:    . folic acid  1 mg Oral Daily  . heparin  5,000 Units Subcutaneous Q8H  . LORazepam  0-4 mg Oral Q12H  . methylPREDNISolone (SOLU-MEDROL) injection  40 mg Intravenous Daily  . multivitamin with minerals  1 tablet Oral Daily  . sodium chloride flush  3 mL Intravenous Q12H  . thiamine  100 mg Oral Daily   Or  . thiamine  100 mg Intravenous Daily   Elmarie Shiley, MD 02/16/2018, 8:31 AM

## 2018-02-16 NOTE — Progress Notes (Signed)
PROGRESS NOTE    YENIFER SACCENTE  ZSW:[id number] DOB: [birth year] DOA: 02/12/2018 PCP: Patient, No Pcp Per    Brief Narrative:  62 year old female who presented with epigastric abdominal pain. He does have significant past medical history for alcohol and cocaine abuse. Reported 1 week of epigastric abdominal pain, stabbing in nature, severe intensity, associated with nausea.Lately had noted dyspnea and productive cough. On the initial physical examination she was hypoxic down to 80%, tachypneic with respirate in the 30s, her heart rate was 89 with blood pressure 161/88, temperature 99.3. She had rhonchi bilaterally, no wheezing, heart S1-S2 present and rhythmic, tachycardic, her abdomen was soft nontender, no lower extremity edema. Sodium 135, potassium 4.8, chloride 106, bicarb 19, glucose 134, BUN 16, creatinine 7.5,white count 12.0.Urinalysis with specific gravity 1.0 130, protein 100, positive granular casts, hyaline casts,kgreater than 50 red cells,0-5 white cells.Urine sodium 85, Streptococcus pneumoniae urine antigen positive.Drug screen positive for cocaine. Chest x-ray with bilateral alveolar infiltrates, right lower lobe and left upper lobe. EKG normal sinus rhythm, normal intervals, normal axis.  Patient was admitted to the hospital working diagnosis sepsis due to bilateral community-acquired pneumonia/aspiration, complicated by acute hypoxic respiratory failure and acute kidney injury.     Assessment & Plan:   Principal Problem:   PNEUMONIA Active Problems:   ELEVATED BLOOD PRESSURE WITHOUT DIAGNOSIS OF HYPERTENSION   Renal failure   Acute respiratory failure with hypoxia (HCC)   Alcohol abuse   Microcytic anemia   Cocaine abuse (HCC)   Epigastric pain   Pneumonia   1. Sepsis due to bilateral pneumonia/ multilobar./ aspiration/ present on admission. Due to streptococcus pneumonia.Patient has remained hemodynamically stable, will continue antibiotic therapy  with ceftriaxone IV. Hold on further IV fluids for now to prevent volume overload. Continue systemic steroids for now, will decrease dose to 20 mg daily.   2. Acute hypoxic respiratory failure. Oxygen saturation 89 to 97 on supplemental oxygen per regular nasal cannula. Will continue oxymetry monitoring and avoid volume overload.   3.OlyguricAKI due to sepsis/ to rule out ATN, on suspected CKD (unspecified stage) complicated with hyperkalemia and non gap metabolic acidosis.patient received 80 mg of IV furosemide yesterday with urine output at 650 ml, this am with stable volume status, may benefit from continue diuresis. Serum cr at 7,8 with K at 4,3 and serum bicarbonate at 26. Positive ANA and myeloperoxidase AB, but negative ANCA. Case discussed with Dr Posey Pronto from nephrology, possible cocaine induced, but will warrant renal biopsy. Continue supportive medical therapy for now, she may need renal replacement therapy on this admission.   4. Anemia of iron deficiency on anemia of chronic renal disease.Tolerated well 2 units prbc, hb this am at 11 with hct at 33.2 no signs of bleeding, will order IV iron infusion before discharge.   5. Alcohol and cocaine abuse.No clinical signs of withdrawal, benzodiazepine protocol has been discontinued. Continue neuro checks per unit protocol, out of bed as tolerated. Will need SNF at discharge, continue physical therapy.   6. Alcohol induced gastritis. Improved abdominal pain, no nausea or vomiting and tolerating po well, continue oral antiacid therapy.    DVT prophylaxis:scd Code Status:full Family Communication:no family at the bedside  Disposition Plan:step down unit   Consultants:    Procedures:    Antimicrobials:  Ceftriaxone   Subjective: Patient feeling stable, no worsening dyspnea or chest pain, noted mild lower extremity edema, no cough or hemoptysis, no fever or chills.   Objective: Vitals:   02/15/18  2228 02/15/18 2341  02/16/18 0300 02/16/18 0814  BP: (!) 184/98 (!) 141/83 (!) 168/84 (!) 154/81  Pulse: 71 82    Resp:  (!) 22 20 (!) 22  Temp: 98.8 F (37.1 C) 98.7 F (37.1 C) 97.7 F (36.5 C) 97.8 F (36.6 C)  TempSrc:  Oral Oral Oral  SpO2: 93% 91% (!) 89% 97%  Weight:      Height:        Intake/Output Summary (Last 24 hours) at 02/16/2018 0833 Last data filed at 02/15/2018 1845 Gross per 24 hour  Intake 640 ml  Output 200 ml  Net 440 ml   Filed Weights   02/13/18 1813 02/14/18 0423 02/15/18 0500  Weight: 70.1 kg (154 lb 8.7 oz) 72.2 kg (159 lb 2.8 oz) 73.2 kg (161 lb 6 oz)    Examination:   General: deconditioned  Neurology: Awake and alert, non focal  E ENT: mild pallor, no icterus, oral mucosa moist Cardiovascular: No JVD. S1-S2 present, rhythmic, no gallops, rubs, or murmurs. No lower extremity edema. Pulmonary: decreased breath sounds bilaterally, poor air movement, with no wheezing or rhonchi, scattered bibasilar rales. Gastrointestinal. Abdomen protuberant with no organomegaly, non tender, no rebound or guarding Skin. No rashes Musculoskeletal: no joint deformities     Data Reviewed: I have personally reviewed following labs and imaging studies  CBC: Recent Labs  Lab 02/13/18 0421 02/13/18 1153 02/14/18 0333 02/15/18 0342 02/16/18 0233  WBC 13.5* 12.7* 12.6* 14.2* 13.7*  NEUTROABS  --   --  11.1* 12.6* 12.0*  HGB 6.7* 8.1* 7.7* 6.8* 11.0*  HCT 21.4* 25.5* 24.5* 21.0* 33.2*  MCV 72.8* 76.1* 77.8* 74.7* 79.0  PLT 272 259 261 274 400   Basic Metabolic Panel: Recent Labs  Lab 02/13/18 1433 02/14/18 0333 02/14/18 1559 02/15/18 0342 02/16/18 0233  NA 138 135 137 140 140  K 5.4* 6.2* 5.6* 4.6 4.3  CL 111 111 109 103 101  CO2 19* 16* 18* 26 26  GLUCOSE 113* 214* 200* 171* 190*  BUN 63* 71* 73* 74* 79*  CREATININE 7.32* 7.72* 7.85* 7.70* 7.83*  CALCIUM 7.4* 7.7* 7.9* 7.1* 7.2*   GFR: Estimated Creatinine Clearance: 6.6 mL/min (A) (by C-G  formula based on SCr of 7.83 mg/dL (H)). Liver Function Tests: Recent Labs  Lab 02/12/18 1534  AST 21  ALT 11*  ALKPHOS 53  BILITOT 0.9  PROT 7.7  ALBUMIN 2.3*   Recent Labs  Lab 02/12/18 1534  LIPASE 40   No results for input(s): AMMONIA in the last 168 hours. Coagulation Profile: Recent Labs  Lab 02/12/18 1947  INR 1.07   Cardiac Enzymes: Recent Labs  Lab 02/12/18 1801 02/14/18 1115  CKTOTAL  --  210  TROPONINI <0.03  --    BNP (last 3 results) No results for input(s): PROBNP in the last 8760 hours. HbA1C: No results for input(s): HGBA1C in the last 72 hours. CBG: Recent Labs  Lab 02/13/18 0752 02/13/18 1028 02/13/18 1711 02/14/18 1028 02/16/18 0818  GLUCAP 115* 147* 87 207* 137*   Lipid Profile: No results for input(s): CHOL, HDL, LDLCALC, TRIG, CHOLHDL, LDLDIRECT in the last 72 hours. Thyroid Function Tests: No results for input(s): TSH, T4TOTAL, FREET4, T3FREE, THYROIDAB in the last 72 hours. Anemia Panel: No results for input(s): VITAMINB12, FOLATE, FERRITIN, TIBC, IRON, RETICCTPCT in the last 72 hours.    Radiology Studies: I have reviewed all of the imaging during this hospital visit personally     Scheduled Meds: . folic acid  1 mg Oral  Daily  . heparin  5,000 Units Subcutaneous Q8H  . LORazepam  0-4 mg Oral Q12H  . methylPREDNISolone (SOLU-MEDROL) injection  40 mg Intravenous Daily  . multivitamin with minerals  1 tablet Oral Daily  . sodium chloride flush  3 mL Intravenous Q12H  . thiamine  100 mg Oral Daily   Or  . thiamine  100 mg Intravenous Daily   Continuous Infusions: . sodium chloride    . cefTRIAXone (ROCEPHIN)  IV Stopped (02/15/18 1915)  . famotidine (PEPCID) IV Stopped (02/15/18 1017)     LOS: 4 days        Chanele Douglas Gerome Apley, MD [facility] Pager [phone]

## 2018-02-16 NOTE — Progress Notes (Signed)
Patient was tearful this am and said she was told she was going to need a kidney biopsy.  She is fearful regarding what this means.  She has a signed consent in the chart.  Reassurance and emotional support provided.  IV site to L forearm was not functioning when her morning Solumedrol was due.  IV team paged and arrived at lunch. IV placed in R arm and Solumedrol given shortly after that time.  Pt did not have any urine after the order for the urine protein creatine ration was seen.  Report left to next shift regarding needing a sample.  Patient was educated that if she needed to urinate to let the RN know so we can collect a specimen and she agreed.

## 2018-02-16 NOTE — Consult Note (Signed)
Chief Complaint: Patient was seen in consultation today for random renal biopsy Chief Complaint  Patient presents with  . Abdominal Pain   at the request of Dr Graylon Gunning   Supervising Physician: Markus Daft  Patient Status: [facility]  History of Present Illness: Rhonda Lynch is a 63 y.o. female   Acute kidney Injury  Presented abd pain; nausea Dyspnea and cough Hx etoh and cocaine abuse Drug screen pos for cocaine + PNA: strept + UTI Renal failure  Dr Posey Pronto note today: Acute kidney injury: Poorly known baseline renal function given paucity of data/lack of regular medical follow-up.  Initially suspected ATN-possibly multifactorial with seemingly prolonged plateau phase however, indeed may have had progression of chronic kidney disease and this may be her baseline.  Concern raised by positive myeloperoxidase antibody which may be induced by previous history of cocaine use versus nonspecific crossover antibody.  Will order for renal biopsy for diagnostic/prognostic reasons to evaluate for possible microscopic polyangiitis/RPGN-with her presentation creatinine, high risk for ESRD.  No acute indications for hemodialysis at this time but I discussed with and sensitized the patient to her high risk situation.  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  2. Anion gap metabolic acidosis: Secondary to acute kidney injury  Now scheduled for random renal biopsy in IR 6/10  History reviewed. No pertinent past medical history.  History reviewed. No pertinent surgical history.  Allergies: Patient has no known allergies.  Medications: Prior to Admission medications   Medication Sig Start Date End Date Taking? Authorizing Provider  acetaminophen (TYLENOL) 325 MG tablet Take 325-650 mg by mouth every 6 (six) hours as needed (for pain or headaches).   Yes [provider]  bismuth subsalicylate (PEPTO BISMOL) 262 MG/15ML suspension Take 30 mLs by mouth  every 6 (six) hours as needed for indigestion or diarrhea or loose stools.   Yes [provider]  ibuprofen (ADVIL,MOTRIN) 200 MG tablet Take 200-400 mg by mouth every 6 (six) hours as needed (for pain or headaches).   Yes [provider]  Pseudoeph-Doxylamine-DM-APAP (NYQUIL MULTI-SYMPTOM PO) Take 20-30 mLs by mouth every 6 (six) hours as needed (for symptoms).   Yes [provider]     Family History  Problem Relation Age of Onset  . Autoimmune disease Neg Hx     Social History   Socioeconomic History  . Marital status: Married    Spouse name: Not on file  . Number of children: Not on file  . Years of education: Not on file  . Highest education level: Not on file  Occupational History  . Not on file  Social Needs  . Financial resource strain: Not on file  . Food insecurity:    Worry: Not on file    Inability: Not on file  . Transportation needs:    Medical: Not on file    Non-medical: Not on file  Tobacco Use  . Smoking status: Current Every Day Smoker    Packs/day: 1.00  . Smokeless tobacco: Never Used  Substance and Sexual Activity  . Alcohol use: Yes  . Drug use: Not Currently    Types: Cocaine  . Sexual activity: Not Currently    Birth control/protection: None  Lifestyle  . Physical activity:    Days per week: Not on file    Minutes per session: Not on file  . Stress: Not on file  Relationships  . Social connections:    Talks on phone: Not on  file    Gets together: Not on file    Attends religious service: Not on file    Active member of club or organization: Not on file    Attends meetings of clubs or organizations: Not on file    Relationship status: Not on file  Other Topics Concern  . Not on file  Social History Narrative  . Not on file    Review of Systems: A 12 point ROS discussed and pertinent positives are indicated in the HPI above.  All other systems are negative.  Review of Systems  Constitutional: Positive for  activity change, appetite change and fatigue. Negative for fever.  Respiratory: Positive for cough and shortness of breath.   Gastrointestinal: Positive for abdominal pain, nausea and vomiting.  Musculoskeletal: Negative for gait problem.  Neurological: Positive for weakness.  Psychiatric/Behavioral: Negative for behavioral problems and confusion.    Vital Signs: BP (!) 154/81 (BP Location: Left Arm)   Pulse 82   Temp 97.8 F (36.6 C) (Oral)   Resp (!) 22   Ht 5\' 1"  (1.549 m)   Wt 161 lb 6 oz (73.2 kg)   SpO2 97%   BMI 30.49 kg/m   Physical Exam  Constitutional: She is oriented to person, place, and time.  Cardiovascular: Normal rate and regular rhythm.  Pulmonary/Chest: Effort normal. She has wheezes.  Abdominal: Normal appearance and bowel sounds are normal. There is tenderness.  Neurological: She is alert and oriented to person, place, and time.  Skin: Skin is warm and dry.  Psychiatric: She has a normal mood and affect. Her behavior is normal.  Nursing note and vitals reviewed.   Imaging: Ct Abdomen Pelvis Wo Contrast  Result Date: 02/12/2018 CLINICAL DATA:  Epigastric abdominal pain for the past week. EXAM: CT ABDOMEN AND PELVIS WITHOUT CONTRAST TECHNIQUE: Multidetector CT imaging of the abdomen and pelvis was performed following the standard protocol without IV contrast. COMPARISON:  CT abdomen pelvis dated February 19, 2010. FINDINGS: Lower chest: Confluent ground-glass density at the right greater than left lung bases with inter and intralobular septal thickening, consistent with crazy paving pattern. This spares the subpleural lung. Hepatobiliary: No focal liver abnormality is seen. Status post cholecystectomy. Mild central intrahepatic and common bile duct dilatation, likely related to post cholecystectomy state. Pancreas: Unremarkable. No pancreatic ductal dilatation or surrounding inflammatory changes. Spleen: Normal in size without focal abnormality. Adrenals/Urinary Tract:  Adrenal glands are unremarkable. Kidneys are normal, without renal calculi, focal lesion, or hydronephrosis. Bladder is unremarkable. Stomach/Bowel: Stomach is within normal limits. Appendix appears normal. No evidence of bowel wall thickening, distention, or inflammatory changes. Vascular/Lymphatic: Aortic atherosclerosis. No enlarged abdominal or pelvic lymph nodes. Reproductive: Prior hysterectomy. 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 Other: Tiny fat containing umbilical hernia. No free fluid or pneumoperitoneum. Musculoskeletal: No acute or significant osseous findings. 3 mm anterolisthesis at L4-L5 due to severe facet arthropathy. Which IMPRESSION: 1.  No acute intra-abdominal process. 2. Confluent ground-glass density and inter and intralobular septal thickening at the right greater than left lung bases, consistent with crazy paving pattern. While this pattern is nonspecific, given the sparing of the subpleural lung, this may be related to cryptogenic organizing pneumonia. Pulmonology consultation is recommended. 3. 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 Electronically Signed   By: Titus Dubin M.D.   On: 02/12/2018 20:01   Dg Chest 1 View  Result Date: 02/14/2018 CLINICAL DATA:  Shortness of breath EXAM: CHEST  1 VIEW COMPARISON:  02/12/2018 FINDINGS:  Severe diffuse bilateral disease throughout the lungs, stable. Cardiomegaly. No visible effusions or acute bony abnormality. IMPRESSION: Severe bilateral airspace disease could reflect edema/CHF or infection. No change. Electronically Signed   By: Rolm Baptise M.D.   On: 02/14/2018 09:38   US Renal  Result Date: 02/14/2018 CLINICAL DATA:  Acute kidney injury. EXAM: RENAL / URINARY TRACT ULTRASOUND COMPLETE COMPARISON:  CT abdomen pelvis dated February 12, 2018. FINDINGS: Right Kidney: Length: 11.0 cm. Increased echogenicity. No mass or hydronephrosis visualized. Left Kidney: Length: 10.9 cm. Increased echogenicity. No mass or hydronephrosis  visualized. Bladder: Decompressed by a Foley catheter. IMPRESSION: Increased renal cortical echogenicity bilaterally, consistent with medical renal disease. Electronically Signed   By: Titus Dubin M.D.   On: 02/14/2018 22:34   Dg Chest Port 1 View  Result Date: 02/12/2018 CLINICAL DATA:  Hypoxia. EXAM: PORTABLE CHEST 1 VIEW COMPARISON:  Chest x-ray dated December 10, 2011. FINDINGS: The heart size and mediastinal contours are within normal limits. Normal pulmonary vascularity. Confluent airspace disease involving the right central lung and left perihilar lung. No pleural effusion or pneumothorax. No acute osseous abnormality. IMPRESSION: 1. Confluent airspace disease in the right greater than left lungs. Electronically Signed   By: Titus Dubin M.D.   On: 02/12/2018 18:53    Labs:  CBC: Recent Labs    02/13/18 1153 02/14/18 0333 02/15/18 0342 02/16/18 0233  WBC 12.7* 12.6* 14.2* 13.7*  HGB 8.1* 7.7* 6.8* 11.0*  HCT 25.5* 24.5* 21.0* 33.2*  PLT 259 261 274 283    COAGS: Recent Labs    02/12/18 1947  INR 1.07    BMP: Recent Labs    02/14/18 0333 02/14/18 1559 02/15/18 0342 02/16/18 0233  NA 135 137 140 140  K 6.2* 5.6* 4.6 4.3  CL 111 109 103 101  CO2 16* 18* 26 26  GLUCOSE 214* 200* 171* 190*  BUN 71* 73* 74* 79*  CALCIUM 7.7* 7.9* 7.1* 7.2*  CREATININE 7.72* 7.85* 7.70* 7.83*  GFRNONAA 5* 5* 5* 5*  GFRAA 6* 6* 6* 6*    LIVER FUNCTION TESTS: Recent Labs    02/12/18 1534  BILITOT 0.9  AST 21  ALT 11*  ALKPHOS 53  PROT 7.7  ALBUMIN 2.3*    TUMOR MARKERS: No results for input(s): AFPTM, CEA, CA199, CHROMGRNA in the last 8760 hours.  Assessment and Plan:  Acute renal failure/kidney injury PNA; UTI Scheduled for random renal bx in IR 6/10 [facility] Form in chart per MD  Risks and benefits discussed with the patient including, but not limited to bleeding, infection, damage to adjacent structures or low yield requiring additional tests. All of the patient's  questions were answered, patient is agreeable to proceed. Consent signed and in chart.  Thank you for this interesting consult.  I greatly enjoyed meeting Rhonda Lynch and look forward to participating in their care.  A copy of this report was sent to the requesting provider on this date.  Electronically Signed: Lavonia Drafts, PA-C 02/16/2018, 10:14 AM   I spent a total of 40 Minutes    in face to face in clinical consultation, greater than 50% of which was counseling/coordinating care for random renal biopsy

## 2018-02-17 ENCOUNTER — Inpatient Hospital Stay (HOSPITAL_COMMUNITY): Payer: Medicaid Other

## 2018-02-17 LAB — CULTURE, BLOOD (ROUTINE X 2)
Culture: NO GROWTH
Culture: NO GROWTH
SPECIAL REQUESTS: ADEQUATE
Special Requests: ADEQUATE

## 2018-02-17 LAB — RENAL FUNCTION PANEL
ALBUMIN: 2 g/dL — AB (ref 3.5–5.0)
Anion gap: 11 (ref 5–15)
BUN: 86 mg/dL — AB (ref 6–20)
CHLORIDE: 100 mmol/L — AB (ref 101–111)
CO2: 27 mmol/L (ref 22–32)
Calcium: 7.3 mg/dL — ABNORMAL LOW (ref 8.9–10.3)
Creatinine, Ser: 7.75 mg/dL — ABNORMAL HIGH (ref 0.44–1.00)
GFR calc Af Amer: 6 mL/min — ABNORMAL LOW (ref >60)
GFR calc non Af Amer: 5 mL/min — ABNORMAL LOW (ref >60)
GLUCOSE: 177 mg/dL — AB (ref 65–99)
PHOSPHORUS: 5.5 mg/dL — AB (ref 2.5–4.6)
POTASSIUM: 4.8 mmol/L (ref 3.5–5.1)
Sodium: 138 mmol/L (ref 135–145)

## 2018-02-17 LAB — PROTEIN / CREATININE RATIO, URINE
CREATININE, URINE: 91.6 mg/dL
PROTEIN CREATININE RATIO: 2.52 mg/mg{creat} — AB (ref 0.00–0.15)
TOTAL PROTEIN, URINE: 231 mg/dL

## 2018-02-17 LAB — HEPATITIS PANEL, ACUTE
HEP A IGM: NEGATIVE
HEP B S AG: NEGATIVE
Hep B C IgM: NEGATIVE

## 2018-02-17 LAB — GLUCOSE, CAPILLARY
Glucose-Capillary: 102 mg/dL — ABNORMAL HIGH (ref 65–99)
Glucose-Capillary: 174 mg/dL — ABNORMAL HIGH (ref 65–99)

## 2018-02-17 LAB — GLOMERULAR BASEMENT MEMBRANE ANTIBODIES: GBM Ab: 3 units (ref 0–20)

## 2018-02-17 LAB — ANTINUCLEAR ANTIBODIES, IFA: ANTINUCLEAR ANTIBODIES, IFA: NEGATIVE

## 2018-02-17 MED ORDER — HYDRALAZINE HCL 20 MG/ML IJ SOLN
INTRAMUSCULAR | Status: AC
  Filled 2018-02-17: qty 1

## 2018-02-17 MED ORDER — METHYLPREDNISOLONE SODIUM SUCC 125 MG IJ SOLR
60.0000 mg | Freq: Three times a day (TID) | INTRAMUSCULAR | Status: DC
  Administered 2018-02-17 – 2018-02-18 (×3): 60 mg via INTRAVENOUS
  Filled 2018-02-17 (×3): qty 2

## 2018-02-17 MED ORDER — LIDOCAINE HCL (PF) 1 % IJ SOLN
INTRAMUSCULAR | Status: AC
  Administered 2018-02-17: 13:00:00
  Filled 2018-02-17: qty 30

## 2018-02-17 MED ORDER — GELATIN ABSORBABLE 12-7 MM EX MISC
CUTANEOUS | Status: AC
  Filled 2018-02-17: qty 1

## 2018-02-17 MED ORDER — FAMOTIDINE 20 MG PO TABS
20.0000 mg | ORAL_TABLET | Freq: Every day | ORAL | Status: DC
  Administered 2018-02-18 – 2018-03-14 (×21): 20 mg via ORAL
  Filled 2018-02-17 (×22): qty 1

## 2018-02-17 MED ORDER — FENTANYL CITRATE (PF) 100 MCG/2ML IJ SOLN
INTRAMUSCULAR | Status: AC
  Filled 2018-02-17: qty 4

## 2018-02-17 MED ORDER — MIDAZOLAM HCL 2 MG/2ML IJ SOLN
INTRAMUSCULAR | Status: AC | PRN
  Administered 2018-02-17: 1 mg via INTRAVENOUS
  Administered 2018-02-17: 0.5 mg via INTRAVENOUS

## 2018-02-17 MED ORDER — SODIUM CHLORIDE 0.9 % IV SOLN
510.0000 mg | Freq: Once | INTRAVENOUS | Status: AC
  Administered 2018-02-17: 510 mg via INTRAVENOUS
  Filled 2018-02-17: qty 17

## 2018-02-17 MED ORDER — FENTANYL CITRATE (PF) 100 MCG/2ML IJ SOLN
INTRAMUSCULAR | Status: AC | PRN
  Administered 2018-02-17: 25 ug via INTRAVENOUS

## 2018-02-17 MED ORDER — HYDRALAZINE HCL 20 MG/ML IJ SOLN
INTRAMUSCULAR | Status: AC | PRN
  Administered 2018-02-17 (×2): 10 mg via INTRAVENOUS

## 2018-02-17 MED ORDER — MIDAZOLAM HCL 2 MG/2ML IJ SOLN
INTRAMUSCULAR | Status: AC
  Filled 2018-02-17: qty 4

## 2018-02-17 NOTE — Procedures (Signed)
Interventional Radiology Procedure Note  Procedure: US guided biopsy of left kidney  Complications: None  Estimated Blood Loss: < 10 mL  Findings: 16 G core biopsy x 2 of LP cortex of left kidney  Ellise Kovack T. Kathlene Cote, M.D Pager:  [phone]

## 2018-02-17 NOTE — Progress Notes (Signed)
PROGRESS NOTE    Rhonda Lynch  GBE:[id number] DOB: July 10, 1956 DOA: 02/12/2018 PCP: Patient, No Pcp Per    Brief Narrative:  62 year old female who presented with epigastric abdominal pain. He does have significant past medical history for alcohol and cocaine abuse. Reported 1 week of epigastric abdominal pain, stabbing in nature, severe intensity, associated with nausea.Lately had noted dyspnea and productive cough. On the initial physical examination she was hypoxic down to 80%, tachypneic with respirate in the 30s, her heart rate was 89 with blood pressure 161/88, temperature 99.3. She had rhonchi bilaterally, no wheezing, heart S1-S2 present and rhythmic, tachycardic, her abdomen was soft nontender, no lower extremity edema. Sodium 135, potassium 4.8, chloride 106, bicarb 19, glucose 134, BUN 16, creatinine 7.5,white count 12.0.Urinalysis with specific gravity 1.0 130, protein 100, positive granular casts, hyaline casts,kgreater than 50 red cells,0-5 white cells.Urine sodium 85, Streptococcus pneumoniae urine antigen positive.Drug screen positive for cocaine. Chest x-ray with bilateral alveolar infiltrates, right lower lobe and left upper lobe. EKG normal sinus rhythm, normal intervals, normal axis.  Patient was admitted to the hospital working diagnosis sepsis due to bilateral community-acquired pneumonia/aspiration, complicated by acute hypoxic respiratory failure and acute kidney injury.   Assessment & Plan:   Principal Problem:   PNEUMONIA Active Problems:   ELEVATED BLOOD PRESSURE WITHOUT DIAGNOSIS OF HYPERTENSION   Renal failure   Acute respiratory failure with hypoxia (HCC)   Alcohol abuse   Microcytic anemia   Cocaine abuse (HCC)   Epigastric pain   Pneumonia   1. Sepsis due to bilateral pneumonia/ multilobar./ aspiration/ present on admission. Due to streptococcus pneumonia.Continue antibiotic therapy with IV ceftriaxone #5/8. Dyspnea has improved,no  fevers. Off IV fluids due to renal failure, will follow chest film in am.   2. Acute hypoxic respiratory failure. Improved oxygenation, with oxygen saturation 92% on 5 LPM, will follow chest film in am, on admission had bilateral dense infiltrates, on physical exam has persistent bilateral rales. Doubt pulmonary hemorrhage. Continue to taper steroids, indicated for severe pneumonia. Out of bed as tolerated.   3.OlyguricAKI due to sepsis/ to rule out ATN/ vasculitis (positive anti-myeloperoxidase Ab), on suspected CKD (unspecified stage) complicated with hyperkalemia and non gap metabolic acidosis.This am patient is clinically euvolemic, no JVD or significant edema. Serum cr has been stable at the 7 range, urine output continue to be low at 400 over last 24 hours. No further hyperkalemia (4,8) or acidosis with serum bicarbonate at 27. Scheduled for renal biopsy today, possible false positive serologies due to cocaine intoxication. Likely patient will need renal replacement therapy on this admission. Will follow with nephrology recommendations.   4. Anemia of iron deficiency on anemia of chronic renal disease.Tolerated well 2 units prbc.Hb and hct have remained stable will order IV iron due to iron deficiency. Target hb not greater than 11.   5. Alcohol and cocaine abuse.Continuw with no clinical signs of withdrawal. Tolerating po well, continue neuro checks per unit protocol.  6. Alcohol induced gastritis.Continue oral antiacid therapy with famotidine, no further abdominal pain.    DVT prophylaxis:scd Code Status:full Family Communication: I spoke with patient's son at the bedside and all questions were addressed.   Disposition Plan:step down unit   Consultants:    Procedures:    Antimicrobials:  Ceftriaxone   Subjective: Patient reports improved dyspnea, but not back to baseline, no edema or chest pain, no further abdominal pain.    Objective: Vitals:   02/17/18 0031 02/17/18 0300 02/17/18 7588 02/17/18 3254  BP: (!) 144/73 (!) 142/68 (!) 169/91 (!) 163/85  Pulse:  79 76 74  Resp:  18 (!) 26   Temp:  97.8 F (36.6 C) 98.5 F (36.9 C)   TempSrc:  Oral Oral   SpO2: 96% 96% 94% 92%  Weight:      Height:        Intake/Output Summary (Last 24 hours) at 02/17/2018 0828 Last data filed at 02/17/2018 0700 Gross per 24 hour  Intake 220 ml  Output 400 ml  Net -180 ml   Filed Weights   02/13/18 1813 02/14/18 0423 02/15/18 0500  Weight: 70.1 kg (154 lb 8.7 oz) 72.2 kg (159 lb 2.8 oz) 73.2 kg (161 lb 6 oz)    Examination:   General: deconditioned, not in dyspnea Neurology: Awake and alert, non focal  E ENT: mild pallor, no icterus, oral mucosa moist Cardiovascular: No JVD. S1-S2 present, rhythmic, no gallops, rubs, or murmurs. Trace lower extremity edema. Pulmonary: positive breath sounds bilaterally, no wheezing or rhonchi, positive bilateral rales. Gastrointestinal. Abdomen with no organomegaly, non tender, no rebound or guarding Skin. No rashes Musculoskeletal: no joint deformities     Data Reviewed: I have personally reviewed following labs and imaging studies  CBC: Recent Labs  Lab 02/13/18 0421 02/13/18 1153 02/14/18 0333 02/15/18 0342 02/16/18 0233  WBC 13.5* 12.7* 12.6* 14.2* 13.7*  NEUTROABS  --   --  11.1* 12.6* 12.0*  HGB 6.7* 8.1* 7.7* 6.8* 11.0*  HCT 21.4* 25.5* 24.5* 21.0* 33.2*  MCV 72.8* 76.1* 77.8* 74.7* 79.0  PLT 272 259 261 274 630   Basic Metabolic Panel: Recent Labs  Lab 02/14/18 0333 02/14/18 1559 02/15/18 0342 02/16/18 0233 02/17/18 0232  NA 135 137 140 140 138  K 6.2* 5.6* 4.6 4.3 4.8  CL 111 109 103 101 100*  CO2 16* 18* 26 26 27   GLUCOSE 214* 200* 171* 190* 177*  BUN 71* 73* 74* 79* 86*  CREATININE 7.72* 7.85* 7.70* 7.83* 7.75*  CALCIUM 7.7* 7.9* 7.1* 7.2* 7.3*  PHOS  --   --   --   --  5.5*   GFR: Estimated Creatinine Clearance: 6.7 mL/min (A) (by C-G  formula based on SCr of 7.75 mg/dL (H)). Liver Function Tests: Recent Labs  Lab 02/12/18 1534 02/17/18 0232  AST 21  --   ALT 11*  --   ALKPHOS 53  --   BILITOT 0.9  --   PROT 7.7  --   ALBUMIN 2.3* 2.0*   Recent Labs  Lab 02/12/18 1534  LIPASE 40   No results for input(s): AMMONIA in the last 168 hours. Coagulation Profile: Recent Labs  Lab 02/12/18 1947  INR 1.07   Cardiac Enzymes: Recent Labs  Lab 02/12/18 1801 02/14/18 1115  CKTOTAL  --  210  TROPONINI <0.03  --    BNP (last 3 results) No results for input(s): PROBNP in the last 8760 hours. HbA1C: No results for input(s): HGBA1C in the last 72 hours. CBG: Recent Labs  Lab 02/13/18 1028 02/13/18 1711 02/14/18 1028 02/16/18 0818 02/17/18 0803  GLUCAP 147* 87 207* 137* 102*   Lipid Profile: No results for input(s): CHOL, HDL, LDLCALC, TRIG, CHOLHDL, LDLDIRECT in the last 72 hours. Thyroid Function Tests: No results for input(s): TSH, T4TOTAL, FREET4, T3FREE, THYROIDAB in the last 72 hours. Anemia Panel: No results for input(s): VITAMINB12, FOLATE, FERRITIN, TIBC, IRON, RETICCTPCT in the last 72 hours.    Radiology Studies: I have reviewed all of the imaging during  this hospital visit personally     Scheduled Meds: . famotidine  20 mg Oral BID  . folic acid  1 mg Oral Daily  . methylPREDNISolone (SOLU-MEDROL) injection  20 mg Intravenous Daily  . multivitamin with minerals  1 tablet Oral Daily  . sodium chloride flush  3 mL Intravenous Q12H  . thiamine  100 mg Oral Daily   Or  . thiamine  100 mg Intravenous Daily   Continuous Infusions: . sodium chloride    . cefTRIAXone (ROCEPHIN)  IV Stopped (02/16/18 2030)     LOS: 5 days        Joy Haegele Gerome Apley, MD [facility] Pager [phone]

## 2018-02-17 NOTE — Progress Notes (Signed)
Admit: 02/12/2018 LOS: 5  37F AKI (unknown chronicity -- admit SCr was 7.58) but with hemturia and MPO-ANCA Positivity  Subjective:  Renal Biopsy 6/10  Hungry post biopsy Says had some coughing up of blood at home prior to admission Denies sores of mouth/nose, brusing/petechaie  06/09 0701 - 06/10 0700 In: 220 [P.O.:120; IV Piggyback:100] Out: 400 [Urine:400]  Filed Weights   02/13/18 1813 02/14/18 0423 02/15/18 0500  Weight: 70.1 kg (154 lb 8.7 oz) 72.2 kg (159 lb 2.8 oz) 73.2 kg (161 lb 6 oz)    Scheduled Meds: . famotidine  20 mg Oral BID  . lidocaine (PF)      . methylPREDNISolone (SOLU-MEDROL) injection  20 mg Intravenous Daily  . multivitamin with minerals  1 tablet Oral Daily  . sodium chloride flush  3 mL Intravenous Q12H  . thiamine  100 mg Oral Daily   Continuous Infusions: . sodium chloride    . cefTRIAXone (ROCEPHIN)  IV Stopped (02/16/18 2030)  . ferumoxytol     PRN Meds:.acetaminophen **OR** acetaminophen, hydrALAZINE, HYDROcodone-acetaminophen, HYDROmorphone (DILAUDID) injection, ondansetron **OR** ondansetron (ZOFRAN) IV, senna-docusate, zolpidem  Current Labs: reviewed  Renal Bx 6/10: pending   Ref. Range 02/14/2018 11:15  Myeloperoxidase Abs Latest Ref Range: 0.0 - 9.0 U/mL 44.7 (H)    Ref. Range 02/14/2018 11:15  ANCA Proteinase 3 Latest Ref Range: 0.0 - 3.5 U/mL <3.5    Ref. Range 02/12/2018 23:44  Anti Nuclear Antibody(ANA) Latest Ref Range: Negative  Positive (A)    Ref. Range 02/12/2018 23:44  HIV Screen 4th Generation wRfx Latest Ref Range: Non Reactive  Non Reactive    Ref. Range 02/17/2018 01:50  Protein Creatinine Ratio Latest Ref Range: 0.00 - 0.15 mg/mgCre 2.52 (H)   Renal US 6/7: FINDINGS:  Right Kidney: Length: 11.0 cm. Increased echogenicity. No mass or hydronephrosis visualized. Left Kidney: Length: 10.9 cm. Increased echogenicity. No mass or hydronephrosis Visualized. Bladder: Decompressed by a Foley catheter. IMPRESSION: Increased  renal cortical echogenicity bilaterally, consistent with medical renal disease  Physical Exam:  Blood pressure (!) 159/88, pulse 100, temperature 98.5 F (36.9 C), temperature source Oral, resp. rate (!) 25, height 5\' 1"  (1.549 m), weight 73.2 kg (161 lb 6 oz), SpO2 91 %. NAD, lying prone RRR CTAB No LEE No rashes/noted  A 1. Renal Failure, presumed AKI but chronicity unknown; hematuric and subnephrotic proteinuria; MPO ANCA positive, admit SCr > 7 2. Cocaine use 6/5 UDS positive for cocaine 3. S pneumoniae PNA on ceftriaxone; also on steroids 4. B/l pulmonary infiltrates, ? hemorrhage 5. NSAID use prior to admission  P 1. Bx pending as could have active GN from ANCA; given infection I hesitate to inc amount of IS, esp as not sure of activity/chronicity of the renal process.  Presenting SCr > 7 is poor prognostic marker  2. If more likely has pulmonary hemorrage then would req immediate more aggressive therapy; will ask pulm to eval 3. Likely to need HD, no immediate indication but developing as renal function fails to imrpove; NPO p MN likely to need Catheter tomorrow 4. On renal Bx, if significant acuity and RPGN is confirmed would treat as severe ANCA disease: pulse steroids, cyclophosphamide, offer TPE 5. Hopeful prelim biopsy result tomorrow   Pearson Grippe MD 02/17/2018, 12:15 PM  Recent Labs  Lab 02/15/18 0342 02/16/18 0233 02/17/18 0232  NA 140 140 138  K 4.6 4.3 4.8  CL 103 101 100*  CO2 26 26 27   GLUCOSE 171* 190* 177*  BUN 74*  79* 86*  CREATININE 7.70* 7.83* 7.75*  CALCIUM 7.1* 7.2* 7.3*  PHOS  --   --  5.5*   Recent Labs  Lab 02/14/18 0333 02/15/18 0342 02/16/18 0233  WBC 12.6* 14.2* 13.7*  NEUTROABS 11.1* 12.6* 12.0*  HGB 7.7* 6.8* 11.0*  HCT 24.5* 21.0* 33.2*  MCV 77.8* 74.7* 79.0  PLT 261 274 283

## 2018-02-17 NOTE — Consult Note (Addendum)
Name: Rhonda Lynch MRN: [id number] DOB: 1956/01/28    ADMISSION DATE:  02/12/2018 CONSULTATION DATE:  02/17/2018  REFERRING MD :  Dr. Cathlean Sauer TR[facility], Dr. Joelyn Oms Nephrology.  CHIEF COMPLAINT:  Diffuse pulmonary infiltrates.   HISTORY OF PRESENT ILLNESS:  62 year old female with PMH significant for cocaine (has smoked crack in the  and alcohol abuse who presented to [facility] ED 6/5 with complaints of abdominal pain with associated nausea and vomiting. Also reported SOB and productive cough with blood tinged and streaks in sputum. Complained of dark urine as well.  Upon arrivl to the ED, she was found to be hypoxemic with O2 sats in the mid 80s on room air with tachypnea. 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 Labs were notable to Creatinine 7.58 and hemoglobin 7.3. UA significant for hematuria and proteinuria She was admitted by the hospitalists and treated with broad spectrum antibiotics.   She was evaluated by nephrology, who initially felt as though the renal failure was hemodynamically mediated due to preceding events and heavy NSAID use. 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 Underwent renal biopsy 6/10. Course complicated by progressive hypoxia escalating up to 10L high flow [place]. PCCM asked to evaluate with concern that vasculitis may be unifying diagnosis for respiratory and renal failure.   SIGNIFICANT EVENTS  6/5 admit with abd pain and hypoxia, AKI found.  6/10 renal biopsy. STUDIES: 6/5 CT abdomen/pelvis > No acute intra-abdominal process. Confluent ground-glass density and inter and intralobular septal thickening at the right greater than left lung bases, consistent with crazy paving pattern. While this pattern is nonspecific, given the sparing of the subpleural lung, this may be related to cryptogenic organizing  pneumonia. Pulmonology consultation is recommended. 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 6/7 Renal US >  Increased renal cortical echogenicity bilaterally, consistent with medical renal disease. 6/10 Kidney biopsy >>>   PAST MEDICAL HISTORY :   has no past medical history on file.  has no past surgical history on file. Prior to Admission medications   Medication Sig Start Date End Date Taking? Authorizing Provider  acetaminophen (TYLENOL) 325 MG tablet Take 325-650 mg by mouth every 6 (six) hours as needed (for pain or headaches).   Yes [provider]  bismuth subsalicylate (PEPTO BISMOL) 262 MG/15ML suspension Take 30 mLs by mouth every 6 (six) hours as needed for indigestion or diarrhea or loose stools.   Yes [provider]  ibuprofen (ADVIL,MOTRIN) 200 MG tablet Take 200-400 mg by mouth every 6 (six) hours as needed (for pain or headaches).   Yes [provider]  Pseudoeph-Doxylamine-DM-APAP (NYQUIL MULTI-SYMPTOM PO) Take 20-30 mLs by mouth every 6 (six) hours as needed (for symptoms).   Yes [provider]   No Known Allergies  FAMILY HISTORY:  family history is not on file. SOCIAL HISTORY:  reports that she has been smoking.  She has been smoking about 1.00 pack per day. She has never used smokeless tobacco. She reports that she drinks alcohol. She reports that she has current or past drug history. Drug: Cocaine.  REVIEW OF SYSTEMS:   Bolds are positive  Constitutional: weight loss, gain, night sweats, Fevers, chills, fatigue .  HEENT: headaches, Sore throat, sneezing, nasal congestion, post nasal drip, Difficulty swallowing, Tooth/dental problems, visual complaints visual changes, ear ache CV:  chest pain, radiates:,Orthopnea,  PND, swelling in lower extremities, dizziness, palpitations, syncope.  GI  heartburn, indigestion, abdominal pain, nausea, vomiting, diarrhea, change in bowel habits, loss of appetite, bloody stools.  Resp: cough,  productive:, hemoptysis, dyspnea, chest pain, pleuritic.  Skin: rash or itching or icterus GU: dysuria, change in color of urine, urgency or frequency. flank pain, hematuria  MS: joint pain or swelling. decreased range of motion  Psych: change in mood or affect. depression or anxiety.  Neuro: difficulty with speech, weakness, numbness, ataxia    SUBJECTIVE:  Comfortable, no new complaints  VITAL SIGNS: Temp:  [97.8 F (36.6 C)-98.9 F (37.2 C)] 98.6 F (37 C) (06/10 1220) Pulse Rate:  [74-100] 97 (06/10 1220) Resp:  [15-26] 20 (06/10 1220) BP: (132-192)/(63-105) 132/63 (06/10 1220) SpO2:  [91 %-98 %] 98 % (06/10 1220)  PHYSICAL EXAMINATION: General:  Overweigh 62 year old female in NAD eating lunch.  Neuro:  Alert, oriented, non-focal HEENT:  [place]/AT, PERRL, no JVD Cardiovascular:  RRR, no MRG Lungs:  Diffuse rales. Some mild increased WOB on 4 L [place]. (had been as high as 10) Abdomen:  Soft, non-tender, non-distended Musculoskeletal:  No acute deformity or ROM limitation Skin:  Grossly intact  Recent Labs  Lab 02/15/18 0342 02/16/18 0233 02/17/18 0232  NA 140 140 138  K 4.6 4.3 4.8  CL 103 101 100*  CO2 26 26 27   BUN 74* 79* 86*  CREATININE 7.70* 7.83* 7.75*  GLUCOSE 171* 190* 177*   Recent Labs  Lab 02/14/18 0333 02/15/18 0342 02/16/18 0233  HGB 7.7* 6.8* 11.0*  HCT 24.5* 21.0* 33.2*  WBC 12.6* 14.2* 13.7*  PLT 261 274 283   US Biopsy (kidney)  Result Date: 02/17/2018 CLINICAL DATA:  Acute kidney injury and microscopic hematuria. Need for renal biopsy. EXAM: ULTRASOUND GUIDED CORE BIOPSY OF LEFT KIDNEY MEDICATIONS: 1.5 mg IV Versed; 25 mcg IV Fentanyl Total Moderate Sedation Time: 17 minutes. [name] level of consciousness and physiologic status were continuously monitored during the procedure by Radiology nursing. The patient was also given 20 mg of IV hydralazine at the beginning of the procedure and prior to needle advancement to treat hypertension.  PROCEDURE: The procedure, risks, benefits, and alternatives were explained to the patient. Questions regarding the procedure were encouraged and answered. The patient understands and consents to the procedure. A time out was performed prior to initiating the procedure. Ultrasound was utilized to image both kidneys. The left flank region was prepped with chlorhexidine in a sterile fashion, and a sterile drape was applied covering the operative field. A sterile gown and sterile gloves were used for the procedure. Local anesthesia was provided with 1% Lidocaine. Core biopsy was performed at the level of lower pole cortex of the left kidney with a 16 gauge needle device. Two separate core biopsy samples were obtained and submitted in saline. Additional ultrasound was performed. COMPLICATIONS: None. FINDINGS: Both kidneys were well visualized by ultrasound. The left kidney was chosen for biopsy. Solid tissue was obtained from the level of the lower pole cortex. Ultrasound shows no evidence of immediate bleeding complication. IMPRESSION: Ultrasound-guided core biopsy performed of the left kidney at the level of lower pole cortex. Electronically Signed   By: Aletta Edouard M.D.   On: 02/17/2018 13:07    ASSESSMENT / PLAN:  Acute hypoxemic respiratory failure in the setting of diffuse pulmonary infiltrates. Differential in broad and includes infectious (strep pneumoniae urinary antigen positive), pulmonary edema (Renal failure and potentially volume up), COPD in the setting of  crack cocaine use ("smoked crack too many times to count"), and autoimmune diease (ANCA positive, c/o some mild hemoptysis prior to admit. GBM negative). Being treated with ABX, steroids and supplemental O2. Overall O2 demand improving.  - Continue supplemental O2 with O2 sat goal. > 92% - CT chest high resolution - May need bronchoscopy to rule out diffuse alveolar hemorrhage if hemoptysis recurs or CT unhelpful.  - Continue ceftriaxone per  primary team - Continue empiric steroids.    Acute Kidney Injury - nephrology following, awaiting biopsy result.   Georgann Housekeeper, AGACNP-BC [place] Pulmonology/Critical Care Pager [phone] or [phone]  02/17/2018 2:18 PM  Attending Note:  62 year old female with history of cocaine abuse who presents to the hospital with SOB and renal failure who underwent a renal biopsy today.  PCCM was consulted for concern for DAH.  On exam, lungs with bibasilar crackles.  I reviewed abdominal CT, the part that visualized the lungs showed diffuse infiltrate sparing the subpleural space consistent with COP.  Discussed with PCCM-NP.    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  Patient seen and examined, agree with above note.  I dictated the care and orders written for this patient under my direction.  Rush Farmer, [place]

## 2018-02-17 NOTE — Progress Notes (Signed)
PT Cancellation Note  Patient Details Name: Rhonda Lynch MRN: [id number] DOB: 09/12/1955   Cancelled Treatment:    Reason Eval/Treat Not Completed: (P) Patient at procedure or test/unavailable Pt currently off floor for kidney biopsy. PT will follow back this afternoon as able.  Devany Aja B. Migdalia Dk PT, DPT Acute Rehabilitation  [phone] Pager [phone]     [place] 02/17/2018, 11:51 AM

## 2018-02-18 ENCOUNTER — Inpatient Hospital Stay (HOSPITAL_COMMUNITY): Payer: Medicaid Other

## 2018-02-18 ENCOUNTER — Encounter (HOSPITAL_COMMUNITY): Payer: Self-pay | Admitting: Interventional Radiology

## 2018-02-18 DIAGNOSIS — J8489 Other specified interstitial pulmonary diseases: Secondary | ICD-10-CM

## 2018-02-18 DIAGNOSIS — R918 Other nonspecific abnormal finding of lung field: Secondary | ICD-10-CM

## 2018-02-18 HISTORY — PX: IR US GUIDE VASC ACCESS RIGHT: IMG2390

## 2018-02-18 HISTORY — PX: IR FLUORO GUIDE CV LINE RIGHT: IMG2283

## 2018-02-18 LAB — CBC WITH DIFFERENTIAL/PLATELET
Abs Immature Granulocytes: 0.1 10*3/uL (ref 0.0–0.1)
Basophils Absolute: 0 10*3/uL (ref 0.0–0.1)
Basophils Relative: 0 %
EOS PCT: 0 %
Eosinophils Absolute: 0 10*3/uL (ref 0.0–0.7)
HEMATOCRIT: 31.8 % — AB (ref 36.0–46.0)
Hemoglobin: 10.3 g/dL — ABNORMAL LOW (ref 12.0–15.0)
Immature Granulocytes: 1 %
LYMPHS ABS: 0.6 10*3/uL — AB (ref 0.7–4.0)
Lymphocytes Relative: 7 %
MCH: 25.8 pg — AB (ref 26.0–34.0)
MCHC: 32.4 g/dL (ref 30.0–36.0)
MCV: 79.7 fL (ref 78.0–100.0)
MONO ABS: 0.3 10*3/uL (ref 0.1–1.0)
MONOS PCT: 3 %
Neutro Abs: 7.6 10*3/uL (ref 1.7–7.7)
Neutrophils Relative %: 89 %
Platelets: 285 10*3/uL (ref 150–400)
RBC: 3.99 MIL/uL (ref 3.87–5.11)
RDW: 19.5 % — AB (ref 11.5–15.5)
WBC: 8.5 10*3/uL (ref 4.0–10.5)

## 2018-02-18 LAB — BASIC METABOLIC PANEL
ANION GAP: 13 (ref 5–15)
BUN: 97 mg/dL — AB (ref 6–20)
CALCIUM: 7.6 mg/dL — AB (ref 8.9–10.3)
CO2: 25 mmol/L (ref 22–32)
Chloride: 99 mmol/L — ABNORMAL LOW (ref 101–111)
Creatinine, Ser: 7.8 mg/dL — ABNORMAL HIGH (ref 0.44–1.00)
GFR calc Af Amer: 6 mL/min — ABNORMAL LOW (ref >60)
GFR, EST NON AFRICAN AMERICAN: 5 mL/min — AB (ref >60)
GLUCOSE: 277 mg/dL — AB (ref 65–99)
POTASSIUM: 4.9 mmol/L (ref 3.5–5.1)
Sodium: 137 mmol/L (ref 135–145)

## 2018-02-18 LAB — C3 COMPLEMENT: C3 Complement: 169 mg/dL — ABNORMAL HIGH (ref 82–167)

## 2018-02-18 LAB — C4 COMPLEMENT: COMPLEMENT C4, BODY FLUID: 27 mg/dL (ref 14–44)

## 2018-02-18 MED ORDER — HEPARIN SODIUM (PORCINE) 1000 UNIT/ML IJ SOLN
INTRAMUSCULAR | Status: AC | PRN
  Administered 2018-02-18: 2800 [IU] via INTRAVENOUS

## 2018-02-18 MED ORDER — CHLORHEXIDINE GLUCONATE CLOTH 2 % EX PADS
6.0000 | MEDICATED_PAD | Freq: Every day | CUTANEOUS | Status: DC
  Administered 2018-02-19 – 2018-02-28 (×10): 6 via TOPICAL

## 2018-02-18 MED ORDER — SODIUM CHLORIDE 0.9 % IV SOLN
250.0000 mg | Freq: Four times a day (QID) | INTRAVENOUS | Status: AC
  Administered 2018-02-18 – 2018-02-19 (×3): 250 mg via INTRAVENOUS
  Filled 2018-02-18 (×3): qty 2

## 2018-02-18 MED ORDER — SODIUM CHLORIDE 0.9 % IV SOLN
500.0000 mg | INTRAVENOUS | Status: DC
  Filled 2018-02-18: qty 4

## 2018-02-18 MED ORDER — HEPARIN SODIUM (PORCINE) 1000 UNIT/ML IJ SOLN
INTRAMUSCULAR | Status: AC
  Filled 2018-02-18: qty 1

## 2018-02-18 MED ORDER — CYCLOPHOSPHAMIDE 50 MG PO TABS
100.0000 mg | ORAL_TABLET | Freq: Every day | ORAL | Status: DC
  Administered 2018-02-18 – 2018-02-20 (×3): 100 mg via ORAL
  Filled 2018-02-18 (×7): qty 2

## 2018-02-18 MED ORDER — LIDOCAINE HCL (PF) 1 % IJ SOLN
INTRAMUSCULAR | Status: AC | PRN
  Administered 2018-02-18: 2 mL

## 2018-02-18 MED ORDER — LIDOCAINE HCL 1 % IJ SOLN
INTRAMUSCULAR | Status: AC
  Filled 2018-02-18: qty 20

## 2018-02-18 NOTE — Progress Notes (Signed)
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 Further, now with confirmed active renal vasculitis I worry even more about DAH/Pulm hemorrhage.  SHe rec HD cath today.  Plan 1. Solumedrol 500mg  IV daily x3, then 60mg /d  2. Initiation TPE, 1PV x 7d, use albumin but given 2u FFP at end of Tx (48h out from Bx) 3. Will start cyclophosphamide 100mg  daily (1.5mg /kg); monitor for leukopenia

## 2018-02-18 NOTE — Consult Note (Signed)
   Patient Status: [facility] - In-pt  Assessment and Plan: Patient in need of venous access.  Plan for image-guided nontunneled hemodialysis catheter placement today or tomorrow.  ______________________________________________________________________   History of Present Illness: Rhonda Lynch is a 62 y.o. female with a past medical history of tobacco abuse. She presented to ED 02/12/2018 with complaint of abdominal pain. She was admitted and found to be in renal failure.  IR requested by Dr. Joelyn Oms for possible image-guided nontunneled hemodialysis catheter placement. Patient awake and alert with no complaints at this time. Denies fever, chest pain, dyspnea, abdominal pain, or dizziness.  Allergies and medications reviewed.   Review of Systems: A 12 point ROS discussed and pertinent positives are indicated in the HPI above.  All other systems are negative.  Review of Systems  Constitutional: Negative for chills and fever.  Respiratory: Negative for shortness of breath and wheezing.   Cardiovascular: Negative for chest pain and palpitations.  Gastrointestinal: Negative for abdominal pain.  Neurological: Negative for dizziness.  Psychiatric/Behavioral: Negative for behavioral problems and confusion.    Vital Signs: BP (!) 164/86 (BP Location: Right Arm)   Pulse 75   Temp 98.3 F (36.8 C) (Oral)   Resp 20   Ht 5\' 1"  (1.549 m)   Wt 167 lb 5.3 oz (75.9 kg)   SpO2 98%   BMI 31.62 kg/m   Physical Exam  Constitutional: She is oriented to person, place, and time. She appears well-developed and well-nourished. No distress.  Cardiovascular: Normal rate, regular rhythm and normal heart sounds.  No murmur heard. Pulmonary/Chest: Effort normal and breath sounds normal. No respiratory distress. She has no wheezes.  Neurological: She is alert and oriented to person, place, and time.  Skin: Skin is warm and dry.  Psychiatric: She has a normal mood and affect. Her behavior is normal.  Judgment and thought content normal.  Nursing note and vitals reviewed.    Imaging reviewed.   Labs:  COAGS: Recent Labs    02/12/18 1947  INR 1.07    BMP: Recent Labs    02/15/18 0342 02/16/18 0233 02/17/18 0232 02/18/18 0315  NA 140 140 138 137  K 4.6 4.3 4.8 4.9  CL 103 101 100* 99*  CO2 26 26 27 25   GLUCOSE 171* 190* 177* 277*  BUN 74* 79* 86* 97*  CALCIUM 7.1* 7.2* 7.3* 7.6*  CREATININE 7.70* 7.83* 7.75* 7.80*  GFRNONAA 5* 5* 5* 5*  GFRAA 6* 6* 6* 6*       Electronically Signed: Earley Abide, PA-C 02/18/2018, 10:42 AM   I spent a total of 15 minutes in face to face in clinical consultation, greater than 50% of which was counseling/coordinating care for venous access.

## 2018-02-18 NOTE — Procedures (Signed)
Interventional Radiology Procedure Note  Procedure: Right IJ 20 cm Trialysis non tunneled HD catheter.  Catheter tips in the RA, ready for use.   Complications: None  Estimated Blood Loss: None  Recommendations: - Routine line care.   Signed,  Criselda Peaches, MD

## 2018-02-18 NOTE — Progress Notes (Signed)
PT Cancellation Note  Patient Details Name: Rhonda Lynch MRN: [id number] DOB: February 10, 1956   Cancelled Treatment:    Reason Eval/Treat Not Completed: (P) Patient at procedure or test/unavailable Pt off floor in interventional radiology. PT will follow back tomorrow.  Maccoy Haubner B. Migdalia Dk PT, DPT Acute Rehabilitation  [phone] Pager [phone]     [place] 02/18/2018, 4:34 PM

## 2018-02-18 NOTE — Progress Notes (Signed)
Inpatient Diabetes Program Recommendations  AACE/ADA: New Consensus Statement on Inpatient Glycemic Control (2015)  Target Ranges:  Prepandial:   less than 140 mg/dL      Peak postprandial:   less than 180 mg/dL (1-2 hours)      Critically ill patients:  140 - 180 mg/dL   Results for Rhonda Lynch, Rhonda Lynch (MRN [id number]) as of 02/18/2018 12:30  Ref. Range 02/18/2018 03:15  Glucose Latest Ref Range: 65 - 99 mg/dL 277 (H)    Admit with: Abd Pain/ Sepsis. Resp Failure  History: Crack Cocaine Abuse, ETOH Abuse  NO History of DM      MD- Note patient started on Solumedrol 60 mg Q8 hours.  Lab glucose elevated this AM to 277 mg/dl.  Please consider placing orders for Novolog Sensitive Correction Scale/ SSI (0-9 units) Q4 hours    --Will follow patient during hospitalization--  Wyn Quaker RN, MSN, CDE Diabetes Coordinator Inpatient Glycemic Control Team Team Pager: [phone] (8a-5p)

## 2018-02-18 NOTE — Progress Notes (Signed)
Admit: 02/12/2018 LOS: 6  36F AKI (unknown chronicity -- admit SCr was 7.58) but with hemturia and MPO-ANCA Positivity  Subjective:  Seen by pulm, HR Chest CT yesterday Pt w/o N/V, no SOB Discussed uncertainty of timecourse of renal failure and etiology Likely to need HD prior to discharge, discussed with patient On higher doess of steroids Hb stable post Bx  06/10 0701 - 06/11 0700 In: 360 [P.O.:360] Out: 100 [Urine:100]  Filed Weights   02/14/18 0423 02/15/18 0500 02/18/18 0500  Weight: 72.2 kg (159 lb 2.8 oz) 73.2 kg (161 lb 6 oz) 75.9 kg (167 lb 5.3 oz)    Scheduled Meds: . famotidine  20 mg Oral Daily  . methylPREDNISolone (SOLU-MEDROL) injection  60 mg Intravenous Q8H  . multivitamin with minerals  1 tablet Oral Daily  . sodium chloride flush  3 mL Intravenous Q12H  . thiamine  100 mg Oral Daily   Continuous Infusions: . sodium chloride    . cefTRIAXone (ROCEPHIN)  IV Stopped (02/17/18 1911)   PRN Meds:.acetaminophen **OR** acetaminophen, hydrALAZINE, HYDROcodone-acetaminophen, HYDROmorphone (DILAUDID) injection, ondansetron **OR** ondansetron (ZOFRAN) IV, senna-docusate, zolpidem  Current Labs: reviewed  Renal Bx 6/10: pending  HR Chest CT 6/10: IMPRESSION: 1. The appearance of the lungs is nonspecific, but there are imaging findings that are suggestive of cryptogenic organizing pneumonia (COP). Other differential considerations include both cardiogenic and noncardiogenic edema, however, this is not strongly favored. 2. Aortic atherosclerosis.   Ref. Range 02/14/2018 11:15  Myeloperoxidase Abs Latest Ref Range: 0.0 - 9.0 U/mL 44.7 (H)    Ref. Range 02/14/2018 11:15  ANCA Proteinase 3 Latest Ref Range: 0.0 - 3.5 U/mL <3.5    Ref. Range 02/12/2018 23:44  Anti Nuclear Antibody(ANA) Latest Ref Range: Negative  Positive (A)    Ref. Range 02/12/2018 23:44  HIV Screen 4th Generation wRfx Latest Ref Range: Non Reactive  Non Reactive    Ref. Range 02/17/2018 01:50  Protein  Creatinine Ratio Latest Ref Range: 0.00 - 0.15 mg/mgCre 2.52 (H)   HCV Ab positive HBV sAg neg C3 and C4 pending  Renal US 6/7: FINDINGS:  Right Kidney: Length: 11.0 cm. Increased echogenicity. No mass or hydronephrosis visualized. Left Kidney: Length: 10.9 cm. Increased echogenicity. No mass or hydronephrosis Visualized. Bladder: Decompressed by a Foley catheter. IMPRESSION: Increased renal cortical echogenicity bilaterally, consistent with medical renal disease  Physical Exam:  Blood pressure (!) 164/86, pulse 75, temperature 98.3 F (36.8 C), temperature source Oral, resp. rate 20, height 5\' 1"  (1.549 m), weight 75.9 kg (167 lb 5.3 oz), SpO2 98 %. NAD, lying prone RRR CTAB No LEE No rashes/noted  A 1. Renal Failure, presumed AKI but chronicity unknown; hematuric and subnephrotic proteinuria; MPO ANCA positive, admit SCr > 7 2. Cocaine use 6/5 UDS positive for cocaine 3. S pneumoniae PNA on ceftriaxone; also on steroids 4. B/l pulmonary infiltrates, ? Hemorrhage, ? COP; pulm following 5. NSAID use prior to admission  P 1. Bx pending as could have active GN from ANCA; given recent infection I hesitate to inc amount of IS, esp as not sure of activity/chronicity of the renal process.  Presenting SCr > 7 is poor prognostic marker.  Will call nephropath today to see when can expect prelim finding 2. If more likely has pulmonary hemorrage then would req immediate more aggressive therapy 3. Likely to need HD, no immediate indication but developing as renal function fails to imrpove; NPO p MN likely to need Catheter tomorrow 4. On renal Bx, if significant acuity and  RPGN is confirmed would treat as severe ANCA disease: pulse steroids, cyclophosphamide, offer TPE 5. Hopeful prelim biopsy result today   Pearson Grippe MD 02/18/2018, 9:26 AM  Recent Labs  Lab 02/16/18 0233 02/17/18 0232 02/18/18 0315  NA 140 138 137  K 4.3 4.8 4.9  CL 101 100* 99*  CO2 26 27 25   GLUCOSE 190*  177* 277*  BUN 79* 86* 97*  CREATININE 7.83* 7.75* 7.80*  CALCIUM 7.2* 7.3* 7.6*  PHOS  --  5.5*  --    Recent Labs  Lab 02/15/18 0342 02/16/18 0233 02/18/18 0315  WBC 14.2* 13.7* 8.5  NEUTROABS 12.6* 12.0* 7.6  HGB 6.8* 11.0* 10.3*  HCT 21.0* 33.2* 31.8*  MCV 74.7* 79.0 79.7  PLT 274 283 285

## 2018-02-18 NOTE — Progress Notes (Signed)
PROGRESS NOTE    Rhonda Lynch  GEX:[id number] DOB: Oct 30, 1955 DOA: 02/12/2018 PCP: Patient, No Pcp Per    Brief Narrative:  62 year old female who presented with epigastric abdominal pain. He does have significant past medical history for alcohol and cocaine abuse. Reported 1 week of epigastric abdominal pain, stabbing in nature, severe intensity, associated with nausea.Lately had noted dyspnea and productive cough. On the initial physical examination she was hypoxic down to 80%, tachypneic with respirate in the 30s, her heart rate was 89 with blood pressure 161/88, temperature 99.3. She had rhonchi bilaterally, no wheezing, heart S1-S2 present and rhythmic, tachycardic, her abdomen was soft nontender, no lower extremity edema. Sodium 135, potassium 4.8, chloride 106, bicarb 19, glucose 134, BUN 16, creatinine 7.5,white count 12.0.Urinalysis with specific gravity 1.0 130, protein 100, positive granular casts, hyaline casts,kgreater than 50 red cells,0-5 white cells.Urine sodium 85, Streptococcus pneumoniae urine antigen positive.Drug screen positive for cocaine. Chest x-ray with bilateral alveolar infiltrates, right lower lobe and left upper lobe. EKG normal sinus rhythm, normal intervals, normal axis.  Patient was admitted to the hospital working diagnosis sepsis due to bilateral community-acquired pneumonia/aspiration, complicated by acute hypoxic respiratory failure and acute kidney injury.   Assessment & Plan:   Principal Problem:   PNEUMONIA Active Problems:   ELEVATED BLOOD PRESSURE WITHOUT DIAGNOSIS OF HYPERTENSION   Renal failure   Acute respiratory failure with hypoxia (HCC)   Alcohol abuse   Microcytic anemia   Cocaine abuse (HCC)   Epigastric pain   Pneumonia  1. Sepsis due to bilateral pneumonia/ multilobar./ aspiration/ present on admission. Due to streptococcus pneumonia. Antibiotic therapy with IV ceftriaxone #6/8. Dyspnea continue to improve. Chest CT  personally reviewed high resolution study, noted bilateral interstitial infiltrates, similar to chest radiograph.     2. Acute hypoxic respiratory failure. Continue to improve oxygenation, with oxygen saturation up to 98% on 3 LPM, considering clinical improvement I doubt pulmonary hemorrhage. Follow renal biopsy results, to rule our vasculitis. To consider continue taper steroids.   3.OlyguricAKI due to sepsis/ to rule out ATN/ vasculitis (positive anti-myeloperoxidase Ab), on suspected CKD (unspecified stage) complicated with hyperkalemia and non gap metabolic acidosis.Continue to have very poor urine output, over last 24 hours 100 ml. Today clinically with no signs of significant volume overload, but likely will need renal replacement therapy on this admission, HD catheter placement has been ordered. Will benefit from ultrafiltration. Serum bicarbonate at 25 and K at 4,9. Follow on renal biopsy, to rule out vasculitis, possible false positive anti myeloperoxidase antibodies, note that ANCA are negative.   4. Anemia of iron deficiency on anemia of chronic renal disease.sp 2 units prbc. Patient received IV iron yesterday, hb and hct have remain stable at 10,3 and 31.8, no clinical signs of bleeding.    5. Alcohol and cocaine abuse. No clinical signs of withdrawal. Benzodiazepine protocol has been discontinued.   6. Alcohol induced gastritis.Decreased dose of famotidine due to decreased GFR, tolerating po well. Out of bed and physical therapy evaluation   DVT prophylaxis:scd Code Status:full Family Communication: No family at the bedside.    Disposition Plan:May need snf, patient likely will need renal replacement therapy on this admission.    Consultants:  Nephrology   Procedures:    Antimicrobials:  Ceftriaxone   Subjective: Patient with improved dyspnea, not back to baseline, no abdominal pain, nausea or vomiting.   Objective: Vitals:    02/17/18 2142 02/17/18 2307 02/18/18 0322 02/18/18 0500  BP: (!) 178/107 (!) 155/62 Marland Kitchen)  168/65   Pulse: 74 72 68   Resp: (!) 22 20 20    Temp: 98.5 F (36.9 C) 98.5 F (36.9 C) 97.8 F (36.6 C)   TempSrc: Oral Oral Oral   SpO2: 94%  96%   Weight:    75.9 kg (167 lb 5.3 oz)  Height:        Intake/Output Summary (Last 24 hours) at 02/18/2018 0809 Last data filed at 02/17/2018 1537 Gross per 24 hour  Intake 360 ml  Output -  Net 360 ml   Filed Weights   02/14/18 0423 02/15/18 0500 02/18/18 0500  Weight: 72.2 kg (159 lb 2.8 oz) 73.2 kg (161 lb 6 oz) 75.9 kg (167 lb 5.3 oz)    Examination:   General: deconditioned  Neurology: Awake and alert, non focal  E ENT: mild pallor, no icterus, oral mucosa moist Cardiovascular: No JVD. S1-S2 present, rhythmic, no gallops, rubs, or murmurs. No lower extremity edema. Pulmonary: decreased breath sounds bilaterally at bases, adequate air movement, no wheezing, rhonchi, bilateral rales, predominant at bases.  Gastrointestinal. Abdomen with no organomegaly, non tender, no rebound or guarding Skin. No rashes Musculoskeletal: no joint deformities     Data Reviewed: I have personally reviewed following labs and imaging studies  CBC: Recent Labs  Lab 02/13/18 1153 02/14/18 0333 02/15/18 0342 02/16/18 0233 02/18/18 0315  WBC 12.7* 12.6* 14.2* 13.7* 8.5  NEUTROABS  --  11.1* 12.6* 12.0* 7.6  HGB 8.1* 7.7* 6.8* 11.0* 10.3*  HCT 25.5* 24.5* 21.0* 33.2* 31.8*  MCV 76.1* 77.8* 74.7* 79.0 79.7  PLT 259 261 274 283 017   Basic Metabolic Panel: Recent Labs  Lab 02/14/18 1559 02/15/18 0342 02/16/18 0233 02/17/18 0232 02/18/18 0315  NA 137 140 140 138 137  K 5.6* 4.6 4.3 4.8 4.9  CL 109 103 101 100* 99*  CO2 18* 26 26 27 25   GLUCOSE 200* 171* 190* 177* 277*  BUN 73* 74* 79* 86* 97*  CREATININE 7.85* 7.70* 7.83* 7.75* 7.80*  CALCIUM 7.9* 7.1* 7.2* 7.3* 7.6*  PHOS  --   --   --  5.5*  --    GFR: Estimated Creatinine Clearance: 6.8  mL/min (A) (by C-G formula based on SCr of 7.8 mg/dL (H)). Liver Function Tests: Recent Labs  Lab 02/12/18 1534 02/17/18 0232  AST 21  --   ALT 11*  --   ALKPHOS 53  --   BILITOT 0.9  --   PROT 7.7  --   ALBUMIN 2.3* 2.0*   Recent Labs  Lab 02/12/18 1534  LIPASE 40   No results for input(s): AMMONIA in the last 168 hours. Coagulation Profile: Recent Labs  Lab 02/12/18 1947  INR 1.07   Cardiac Enzymes: Recent Labs  Lab 02/12/18 1801 02/14/18 1115  CKTOTAL  --  210  TROPONINI <0.03  --    BNP (last 3 results) No results for input(s): PROBNP in the last 8760 hours. HbA1C: No results for input(s): HGBA1C in the last 72 hours. CBG: Recent Labs  Lab 02/13/18 1711 02/14/18 1028 02/16/18 0818 02/17/18 0803 02/17/18 1443  GLUCAP 87 207* 137* 102* 174*   Lipid Profile: No results for input(s): CHOL, HDL, LDLCALC, TRIG, CHOLHDL, LDLDIRECT in the last 72 hours. Thyroid Function Tests: No results for input(s): TSH, T4TOTAL, FREET4, T3FREE, THYROIDAB in the last 72 hours. Anemia Panel: No results for input(s): VITAMINB12, FOLATE, FERRITIN, TIBC, IRON, RETICCTPCT in the last 72 hours.    Radiology Studies: I have reviewed all of  the imaging during this hospital visit personally     Scheduled Meds: . famotidine  20 mg Oral Daily  . methylPREDNISolone (SOLU-MEDROL) injection  60 mg Intravenous Q8H  . multivitamin with minerals  1 tablet Oral Daily  . sodium chloride flush  3 mL Intravenous Q12H  . thiamine  100 mg Oral Daily   Continuous Infusions: . sodium chloride    . cefTRIAXone (ROCEPHIN)  IV Stopped (02/17/18 1911)     LOS: 6 days        Mauricio Gerome Apley, MD [facility] Pager [phone]

## 2018-02-18 NOTE — Progress Notes (Addendum)
Name: Rhonda Lynch MRN: [id number] DOB: Nov 13, 1955    ADMISSION DATE:  02/12/2018 CONSULTATION DATE:  02/17/2018  REFERRING MD :  Dr. Cathlean Sauer TRH, Dr. Joelyn Oms [facility].  CHIEF COMPLAINT:  Diffuse pulmonary infiltrates.   HISTORY OF PRESENT ILLNESS:  62 year old female with PMH significant for cocaine (has smoked crack in the  and alcohol abuse who presented to [facility] ED 6/5 with complaints of abdominal pain with associated nausea and vomiting. Also reported SOB and productive cough with blood tinged and streaks in sputum. Complained of dark urine as well.  Upon arrivl to the ED, she was found to be hypoxemic with O2 sats in the mid 80s on room air with tachypnea. 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 Labs were notable to Creatinine 7.58 and hemoglobin 7.3. UA significant for hematuria and proteinuria She was admitted by the hospitalists and treated with broad spectrum antibiotics.   She was evaluated by nephrology, who initially felt as though the renal failure was hemodynamically mediated due to preceding events and heavy NSAID use. 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 Underwent renal biopsy 6/10. Course complicated by progressive hypoxia escalating up to 10L high flow [place]. PCCM asked to evaluate with concern that vasculitis may be unifying diagnosis for respiratory and renal failure.   SIGNIFICANT EVENTS  6/5 admit with abd pain and hypoxia, AKI found.  6/10 renal biopsy. STUDIES: 6/5 CT abdomen/pelvis > No acute intra-abdominal process. Confluent ground-glass density and inter and intralobular septal thickening at the right greater than left lung bases, consistent with crazy paving pattern. While this pattern is nonspecific, given the sparing of the subpleural lung, this may be related to cryptogenic organizing  pneumonia. Pulmonology consultation is recommended. 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 6/7 Renal US >  Increased renal cortical echogenicity bilaterally, consistent with medical renal disease. 6/10 Kidney biopsy >>>    SUBJECTIVE:  Resting in bed.  Somewhat emotional today, feeling overwhelmed about pending dialysis catheter  VITAL SIGNS: Temp:  [97.8 F (36.6 C)-98.6 F (37 C)] 98.3 F (36.8 C) (06/11 0837) Pulse Rate:  [68-100] 75 (06/11 0837) Resp:  [20-24] 20 (06/11 0837) BP: (122-188)/(58-107) 164/86 (06/11 0837) SpO2:  [91 %-98 %] 98 % (06/11 0837) Weight:  [167 lb 5.3 oz (75.9 kg)] 167 lb 5.3 oz (75.9 kg) (06/11 0500)  PHYSICAL EXAMINATION: General: Pleasant 62 year old female resting in bed no acute distress HEENT normocephalic atraumatic no jugular venous distention mucous membranes moist Pulmonary: Diminished bases, no accessory use Cardiac: Regular rate and rhythm abdomen: Soft nontender extremities: Brisk cap refill no significant edema warm dry strong pulses Abdomen: Soft nontender Neuro: Awake oriented no focal deficits Recent Labs  Lab 02/16/18 0233 02/17/18 0232 02/18/18 0315  NA 140 138 137  K 4.3 4.8 4.9  CL 101 100* 99*  CO2 26 27 25   BUN 79* 86* 97*  CREATININE 7.83* 7.75* 7.80*  GLUCOSE 190* 177* 277*   Recent Labs  Lab 02/15/18 0342 02/16/18 0233 02/18/18 0315  HGB 6.8* 11.0* 10.3*  HCT 21.0* 33.2* 31.8*  WBC 14.2* 13.7* 8.5  PLT 274 283 285   Ct Chest High Resolution  Result Date: 02/18/2018 CLINICAL DATA:  62 year old female with history of alcohol and cocaine abuse presenting with dyspnea and productive cough. Abnormal chest x-ray. EXAM: CT CHEST WITHOUT CONTRAST TECHNIQUE: Multidetector CT  imaging of the chest was performed following the standard protocol without intravenous contrast. High resolution imaging of the lungs, as well as inspiratory and expiratory imaging, was performed. COMPARISON:  No prior chest CT.  Chest x-ray  02/14/2018. FINDINGS: Cardiovascular: Heart size is mildly enlarged. There is no significant pericardial fluid, thickening or pericardial calcification. Aortic atherosclerosis. No definite coronary artery calcifications. Mediastinum/Nodes: No pathologically enlarged mediastinal or hilar lymph nodes. Please note that accurate exclusion of hilar adenopathy is limited on noncontrast CT scans. Esophagus is unremarkable in appearance. No axillary lymphadenopathy. Lungs/Pleura: High-resolution images demonstrate widespread areas of ground-glass attenuation and septal thickening, as well as widespread areas of peripheral bronchiolectasis. Subpleural sparing is noted, particularly throughout the mid to lower lungs. These findings have no definitive craniocaudal gradient, but appear slightly more pronounced throughout the mid to upper lungs than the lower lungs. No honeycombing. Inspiratory and expiratory imaging is unremarkable. Linear area of scarring or atelectasis in the periphery of the left lower lobe. Trace left pleural effusion lying dependently. Upper Abdomen: Unremarkable. Musculoskeletal: There are no aggressive appearing lytic or blastic lesions noted in the visualized portions of the skeleton. IMPRESSION: 1. The appearance of the lungs is nonspecific, but there are imaging findings that are suggestive of cryptogenic organizing pneumonia (COP). Other differential considerations include both cardiogenic and noncardiogenic edema, however, this is not strongly favored. Evaluation by Pulmonology is strongly recommended. 2. Aortic atherosclerosis. Aortic Atherosclerosis (ICD10-I70.0). Electronically Signed   By: Vinnie Langton M.D.   On: 02/18/2018 08:37   US Biopsy (kidney)  Result Date: 02/17/2018 CLINICAL DATA:  Acute kidney injury and microscopic hematuria. Need for renal biopsy. EXAM: ULTRASOUND GUIDED CORE BIOPSY OF LEFT KIDNEY MEDICATIONS: 1.5 mg IV Versed; 25 mcg IV Fentanyl Total Moderate Sedation Time:  17 minutes. [name] level of consciousness and physiologic status were continuously monitored during the procedure by Radiology nursing. The patient was also given 20 mg of IV hydralazine at the beginning of the procedure and prior to needle advancement to treat hypertension. PROCEDURE: The procedure, risks, benefits, and alternatives were explained to the patient. Questions regarding the procedure were encouraged and answered. The patient understands and consents to the procedure. A time out was performed prior to initiating the procedure. Ultrasound was utilized to image both kidneys. The left flank region was prepped with chlorhexidine in a sterile fashion, and a sterile drape was applied covering the operative field. A sterile gown and sterile gloves were used for the procedure. Local anesthesia was provided with 1% Lidocaine. Core biopsy was performed at the level of lower pole cortex of the left kidney with a 16 gauge needle device. Two separate core biopsy samples were obtained and submitted in saline. Additional ultrasound was performed. COMPLICATIONS: None. FINDINGS: Both kidneys were well visualized by ultrasound. The left kidney was chosen for biopsy. Solid tissue was obtained from the level of the lower pole cortex. Ultrasound shows no evidence of immediate bleeding complication. IMPRESSION: Ultrasound-guided core biopsy performed of the left kidney at the level of lower pole cortex. Electronically Signed   By: Aletta Edouard M.D.   On: 02/17/2018 13:07    ASSESSMENT / PLAN:  Acute hypoxic respiratory failure in setting of acute pulmonary infiltrates (ANCA positive). Ddx: BOOP vs DAH w/ vasculitis picture vs PNA vs ALI from Crack Cocaine  -no hemoptysis -started higher dose steroids 6/10 Plan/rec Cont abx, now day 7 ceftriaxone Cont steroids at 60mg  q 8; if renal bx confirmatory will likely need cyclophosphamide.  Repeat cxr in  am Wean oxygen  AKI. ANCA + -renal following,.   Plan Await renal bx prob will need HD  Erick Colace ACNP-BC [place] Pager # [phone] OR # [phone] if no answer  Attending Note:  62 year old female with history of cocaine abuse who presents to the hospital with SOB and renal failure.  Patient underwent a renal biopsy and results are pending.  PCCM was consulted for concern for DAH, pulmonary infiltrate and hypoxemia.  Patient has been on rocephin for 7 days at this point with no improvement.  On exam, diffuse crackles noted.  I reviewed chest CT myself, diffuse infiltrate sparing the subpleural areas.  Discussed with PCCM-NP and nephrology-MD.  Pulmonary infiltrate: classic for BOOP but other etiologies such as crack lung and pulmonary edema can not be ruled out.  Unlikely to be an infection with abx x7 days and the auto-immune presentation.  - Check PCT  - Increase solumedrol to 250 mg IV q6 x4 days then change to 1 mg/kg/day of prednisone for treatment of BOOP  - DAH treatment is similar.  - Will need f/u with PCCM as outpatient whenever she is closer to discharge.  Hypoxemia:  - Titrate O2 for sat of 88-92%  - Will need an ambulatory desaturation study prior to discharge for home O2  Autoimmune renal failure:  - Steroids as above  - ?cyclophosphamide, will consult with renal  Urine strep positive  - Continue rocephin specially with the infiltrate  - Monitor for worsening.  PCCM will continue to follow.  Patient seen and examined, agree with above note.  I dictated the care and orders written for this patient under my direction.  Rush Farmer, [place]

## 2018-02-19 DIAGNOSIS — R042 Hemoptysis: Secondary | ICD-10-CM

## 2018-02-19 DIAGNOSIS — I776 Arteritis, unspecified: Secondary | ICD-10-CM

## 2018-02-19 DIAGNOSIS — R0902 Hypoxemia: Secondary | ICD-10-CM

## 2018-02-19 DIAGNOSIS — N019 Rapidly progressive nephritic syndrome with unspecified morphologic changes: Secondary | ICD-10-CM

## 2018-02-19 DIAGNOSIS — R0489 Hemorrhage from other sites in respiratory passages: Secondary | ICD-10-CM

## 2018-02-19 DIAGNOSIS — J8489 Other specified interstitial pulmonary diseases: Secondary | ICD-10-CM

## 2018-02-19 LAB — CBC
HEMATOCRIT: 32.5 % — AB (ref 36.0–46.0)
HEMOGLOBIN: 10.5 g/dL — AB (ref 12.0–15.0)
MCH: 25.8 pg — ABNORMAL LOW (ref 26.0–34.0)
MCHC: 32.3 g/dL (ref 30.0–36.0)
MCV: 79.9 fL (ref 78.0–100.0)
Platelets: 310 10*3/uL (ref 150–400)
RBC: 4.07 MIL/uL (ref 3.87–5.11)
RDW: 19.8 % — AB (ref 11.5–15.5)
WBC: 11.9 10*3/uL — ABNORMAL HIGH (ref 4.0–10.5)

## 2018-02-19 LAB — BASIC METABOLIC PANEL
Anion gap: 13 (ref 5–15)
BUN: 110 mg/dL — AB (ref 6–20)
CHLORIDE: 98 mmol/L — AB (ref 101–111)
CO2: 24 mmol/L (ref 22–32)
Calcium: 7.7 mg/dL — ABNORMAL LOW (ref 8.9–10.3)
Creatinine, Ser: 7.45 mg/dL — ABNORMAL HIGH (ref 0.44–1.00)
GFR calc Af Amer: 6 mL/min — ABNORMAL LOW (ref >60)
GFR calc non Af Amer: 5 mL/min — ABNORMAL LOW (ref >60)
GLUCOSE: 337 mg/dL — AB (ref 65–99)
Potassium: 5.6 mmol/L — ABNORMAL HIGH (ref 3.5–5.1)
SODIUM: 135 mmol/L (ref 135–145)

## 2018-02-19 LAB — POCT I-STAT, CHEM 8
BUN: 57 mg/dL — ABNORMAL HIGH (ref 6–20)
CHLORIDE: 98 mmol/L — AB (ref 101–111)
CREATININE: 4.5 mg/dL — AB (ref 0.44–1.00)
Calcium, Ion: 1.05 mmol/L — ABNORMAL LOW (ref 1.15–1.40)
GLUCOSE: 341 mg/dL — AB (ref 65–99)
HCT: 32 % — ABNORMAL LOW (ref 36.0–46.0)
Hemoglobin: 10.9 g/dL — ABNORMAL LOW (ref 12.0–15.0)
POTASSIUM: 4.6 mmol/L (ref 3.5–5.1)
Sodium: 136 mmol/L (ref 135–145)
TCO2: 24 mmol/L (ref 22–32)

## 2018-02-19 LAB — GLUCOSE, CAPILLARY: GLUCOSE-CAPILLARY: 252 mg/dL — AB (ref 65–99)

## 2018-02-19 LAB — HEPATITIS B SURFACE ANTIGEN: Hepatitis B Surface Ag: NEGATIVE

## 2018-02-19 MED ORDER — SODIUM CHLORIDE 0.9 % IV SOLN: Freq: Once | INTRAVENOUS | Status: DC

## 2018-02-19 MED ORDER — SODIUM CHLORIDE 0.9 % IV SOLN: 100.0000 mL | INTRAVENOUS | Status: DC | PRN

## 2018-02-19 MED ORDER — SODIUM CHLORIDE 0.9 % IV SOLN
250.0000 mg | Freq: Four times a day (QID) | INTRAVENOUS | Status: DC
  Filled 2018-02-19 (×4): qty 2

## 2018-02-19 MED ORDER — PENTAFLUOROPROP-TETRAFLUOROETH EX AERO: 1.0000 "application " | INHALATION_SPRAY | CUTANEOUS | Status: DC | PRN

## 2018-02-19 MED ORDER — SODIUM CHLORIDE 0.9 % IV SOLN
250.0000 mg | Freq: Four times a day (QID) | INTRAVENOUS | Status: DC
  Administered 2018-02-19 – 2018-02-20 (×4): 250 mg via INTRAVENOUS
  Filled 2018-02-19 (×5): qty 2

## 2018-02-19 MED ORDER — ACD FORMULA A 0.73-2.45-2.2 GM/100ML VI SOLN
500.0000 mL | Status: DC
  Administered 2018-02-19: 500 mL via INTRAVENOUS

## 2018-02-19 MED ORDER — LIDOCAINE HCL (PF) 1 % IJ SOLN: 5.0000 mL | INTRAMUSCULAR | Status: DC | PRN

## 2018-02-19 MED ORDER — CALCIUM CARBONATE ANTACID 500 MG PO CHEW
CHEWABLE_TABLET | ORAL | Status: AC
  Administered 2018-02-19: 400 mg via ORAL
  Filled 2018-02-19: qty 2

## 2018-02-19 MED ORDER — LIDOCAINE-PRILOCAINE 2.5-2.5 % EX CREA: 1.0000 "application " | TOPICAL_CREAM | CUTANEOUS | Status: DC | PRN

## 2018-02-19 MED ORDER — HEPARIN SODIUM (PORCINE) 1000 UNIT/ML DIALYSIS: 1000.0000 [IU] | INTRAMUSCULAR | Status: DC | PRN

## 2018-02-19 MED ORDER — ACD FORMULA A 0.73-2.45-2.2 GM/100ML VI SOLN
Status: AC
  Administered 2018-02-19: 500 mL via INTRAVENOUS
  Filled 2018-02-19: qty 500

## 2018-02-19 MED ORDER — ALTEPLASE 2 MG IJ SOLR: 2.0000 mg | Freq: Once | INTRAMUSCULAR | Status: DC | PRN

## 2018-02-19 MED ORDER — CALCIUM CARBONATE ANTACID 500 MG PO CHEW
2.0000 | CHEWABLE_TABLET | ORAL | Status: DC
  Administered 2018-02-19: 400 mg via ORAL

## 2018-02-19 MED ORDER — ACETAMINOPHEN 325 MG PO TABS: 650.0000 mg | ORAL_TABLET | ORAL | Status: DC | PRN

## 2018-02-19 MED ORDER — SODIUM CHLORIDE 0.9 % IV SOLN
Freq: Once | INTRAVENOUS | Status: AC
  Administered 2018-02-19: 12:00:00 via INTRAVENOUS_CENTRAL
  Filled 2018-02-19 (×3): qty 200

## 2018-02-19 MED ORDER — SODIUM CHLORIDE 0.9 % IV SOLN
2.0000 g | Freq: Once | INTRAVENOUS | Status: AC
  Administered 2018-02-19: 2 g via INTRAVENOUS
  Filled 2018-02-19: qty 20

## 2018-02-19 MED ORDER — DIPHENHYDRAMINE HCL 25 MG PO CAPS: 25.0000 mg | ORAL_CAPSULE | Freq: Four times a day (QID) | ORAL | Status: DC | PRN

## 2018-02-19 MED ORDER — HEPARIN SODIUM (PORCINE) 1000 UNIT/ML IJ SOLN: 1000.0000 [IU] | Freq: Once | INTRAMUSCULAR | Status: DC

## 2018-02-19 NOTE — Progress Notes (Signed)
PROGRESS NOTE    DESIRAI TRAXLER  ZOX:[id number] DOB: 03-28-1956 DOA: 02/12/2018 PCP: Patient, No Pcp Per    Brief Narrative:  62 year old female who presented with epigastric abdominal pain. He does have significant past medical history for alcohol and cocaine abuse. Reported 1 week of epigastric abdominal pain, stabbing in nature, severe intensity, associated with nausea.Lately had noted dyspnea and productive cough. On the initial physical examination she was hypoxic down to 80%, tachypneic with respirate in the 30s, her heart rate was 89 with blood pressure 161/88, temperature 99.3. She had rhonchi bilaterally, no wheezing, heart S1-S2 present and rhythmic, tachycardic, her abdomen was soft nontender, no lower extremity edema. Sodium 135, potassium 4.8, chloride 106, bicarb 19, glucose 134, BUN 16, creatinine 7.5,white count 12.0.Urinalysis with specific gravity 1.0 130, protein 100, positive granular casts, hyaline casts,kgreater than 50 red cells,0-5 white cells.Urine sodium 85, Streptococcus pneumoniae urine antigen positive.Drug screen positive for cocaine. Chest x-ray with bilateral alveolar infiltrates, right lower lobe and left upper lobe. EKG normal sinus rhythm, normal intervals, normal axis.  Patient was admitted to the hospital working diagnosis sepsis due to bilateral community-acquired pneumonia/aspiration, complicated by acute hypoxic respiratory failure and acute kidney injury.   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 Sepsis due to bilateral pneumonia/ multilobar due to streptococcus pneumonia complicated with cryptogenic  organizing pneumonia and acute hypoxic respiratory failure. On IV ceftriaxone #7/8. Reports dyspnea improvement, oxygen saturation 100% on 4 LPM per nasal cannula. High resolution CT scan with bilateral interstitial infiltrates, suggestive cryptogenic organizing pneumonia. Patient will need a prolonged course of systemic steroids. Out of bed as tolerated and physical therapy evaluation.   2.OlyguricAKI due to active rapid progressive glomerulonephritis, from myeloperoxidase ANCA vasculitis, complicated with hyperkalemia and non gap metabolic acidosis.Patient has been started on immunosupressive therapy with Methylprednisolone and Cylophosphamide, will continue to follow nephrology recommendations.  Today's K is 4,6.    4. Anemia of iron deficiency on anemia of chronic renal disease.sp 2 units prbc and IV iron. hband hct has remained stable after renal biopsy, hb at 10.9 with hct at 32.   5. Alcohol and cocaine abuse. Patient with no withdrawal symptoms.   6. Alcohol induced gastritis.Continue low dose of famotidine tolerating po well.   DVT prophylaxis:scd Code Status:full Family Communication: No family at the bedside.    Disposition Plan:May need snf.   Consultants:  Nephrology   Procedures:    Antimicrobials:  Ceftriaxone   Subjective: Patient on HD, feeling better, no nausea or vomiting, dyspnea continue to improve, no chest pain.   Objective: Vitals:   02/19/18 0900 02/19/18 0930 02/19/18 1030 02/19/18 1039  BP: (!) 159/80 (!) 150/85 (!) 162/87 (!) 165/92  Pulse: 69 89 91 72  Resp: 15 20 20 18   Temp:    97.9 F (36.6 C)  TempSrc:    Oral  SpO2:    100%  Weight:      Height:        Intake/Output Summary (Last 24 hours) at 02/19/2018 1144 Last data filed at 02/19/2018 1039 Gross per 24 hour  Intake 346.33 ml  Output 1000 ml  Net -653.67  ml   Filed Weights   02/18/18 0500 02/19/18 0500 02/19/18 0828  Weight: 75.9 kg (167 lb 5.3 oz)  73.8 kg (162 lb 12.8 oz) 75.1 kg (165 lb 9.1 oz)    Examination:   General: Not in pain or dyspnea, deconditioned Neurology: Awake and alert, non focal  E ENT: mild pallor, no icterus, oral mucosa moist Cardiovascular: No JVD. S1-S2 present, rhythmic, no gallops, rubs, or murmurs. No lower extremity edema. Pulmonary: decreased breath sounds bilaterally,  no wheezing or rhonchi, bilateral scattered rales. Gastrointestinal. Abdomen with no organomegaly, non tender, no rebound or guarding Skin. No rashes Musculoskeletal: no joint deformities     Data Reviewed: I have personally reviewed following labs and imaging studies  CBC: Recent Labs  Lab 02/14/18 0333 02/15/18 0342 02/16/18 0233 02/18/18 0315 02/19/18 0859  WBC 12.6* 14.2* 13.7* 8.5 11.9*  NEUTROABS 11.1* 12.6* 12.0* 7.6  --   HGB 7.7* 6.8* 11.0* 10.3* 10.5*  HCT 24.5* 21.0* 33.2* 31.8* 32.5*  MCV 77.8* 74.7* 79.0 79.7 79.9  PLT 261 274 283 285 161   Basic Metabolic Panel: Recent Labs  Lab 02/15/18 0342 02/16/18 0233 02/17/18 0232 02/18/18 0315 02/19/18 0308  NA 140 140 138 137 135  K 4.6 4.3 4.8 4.9 5.6*  CL 103 101 100* 99* 98*  CO2 26 26 27 25 24   GLUCOSE 171* 190* 177* 277* 337*  BUN 74* 79* 86* 97* 110*  CREATININE 7.70* 7.83* 7.75* 7.80* 7.45*  CALCIUM 7.1* 7.2* 7.3* 7.6* 7.7*  PHOS  --   --  5.5*  --   --    GFR: Estimated Creatinine Clearance: 7.1 mL/min (A) (by C-G formula based on SCr of 7.45 mg/dL (H)). Liver Function Tests: Recent Labs  Lab 02/12/18 1534 02/17/18 0232  AST 21  --   ALT 11*  --   ALKPHOS 53  --   BILITOT 0.9  --   PROT 7.7  --   ALBUMIN 2.3* 2.0*   Recent Labs  Lab 02/12/18 1534  LIPASE 40   No results for input(s): AMMONIA in the last 168 hours. Coagulation Profile: Recent Labs  Lab 02/12/18 1947  INR 1.07   Cardiac Enzymes: Recent Labs  Lab 02/12/18 1801 02/14/18 1115  CKTOTAL  --  210  TROPONINI <0.03  --    BNP (last 3 results) No results for  input(s): PROBNP in the last 8760 hours. HbA1C: No results for input(s): HGBA1C in the last 72 hours. CBG: Recent Labs  Lab 02/14/18 1028 02/16/18 0818 02/17/18 0803 02/17/18 1443 02/19/18 0730  GLUCAP 207* 137* 102* 174* 252*   Lipid Profile: No results for input(s): CHOL, HDL, LDLCALC, TRIG, CHOLHDL, LDLDIRECT in the last 72 hours. Thyroid Function Tests: No results for input(s): TSH, T4TOTAL, FREET4, T3FREE, THYROIDAB in the last 72 hours. Anemia Panel: No results for input(s): VITAMINB12, FOLATE, FERRITIN, TIBC, IRON, RETICCTPCT in the last 72 hours.    Radiology Studies: I have reviewed all of the imaging during this hospital visit personally     Scheduled Meds: . calcium carbonate      . calcium carbonate  2 tablet Oral Q3H  . Chlorhexidine Gluconate Cloth  6 each Topical Q0600  . cyclophosphamide  100 mg Oral Daily  . famotidine  20 mg Oral Daily  . heparin  1,000 Units Intracatheter Once  . multivitamin with minerals  1 tablet Oral Daily  . sodium chloride flush  3 mL Intravenous Q12H  . thiamine  100 mg Oral Daily  Continuous Infusions: . citrate dextrose    . sodium chloride    . sodium chloride    . sodium chloride    . sodium chloride    . therapeutic plasma exchange solution    . calcium gluconate IVPB    . citrate dextrose    . methylPREDNISolone (SOLU-MEDROL) injection       LOS: 7 days        Bolden Hagerman Gerome Apley, MD [facility] Pager [phone]

## 2018-02-19 NOTE — Progress Notes (Signed)
PT Cancellation Note  Patient Details Name: Rhonda Lynch MRN: [id number] DOB: Jan 13, 1956   Cancelled Treatment:    Reason Eval/Treat Not Completed: (P) Patient at procedure or test/unavailable Pt in HD. PT will try to follow back this afternoon.  Jlynn Ly B. Migdalia Dk PT, DPT Acute Rehabilitation  [phone] Pager [phone]  [place] 02/19/2018, 12:06 PM

## 2018-02-19 NOTE — Progress Notes (Addendum)
Inpatient Diabetes Program Recommendations  AACE/ADA: New Consensus Statement on Inpatient Glycemic Control (2015)  Target Ranges:  Prepandial:   less than 140 mg/dL      Peak postprandial:   less than 180 mg/dL (1-2 hours)      Critically ill patients:  140 - 180 mg/dL   Lab Results  Component Value Date   GLUCAP 252 (H) 02/19/2018   HGBA1C (H) 02/20/2010    5.9 (NOTE)                                                                       According to the ADA Clinical Practice Recommendations for 2011, when HbA1c is used as a screening test:   >=6.5%   Diagnostic of Diabetes Mellitus           (if abnormal result  is confirmed)  5.7-6.4%   Increased risk of developing Diabetes Mellitus  References:Diagnosis and Classification of Diabetes Mellitus,Diabetes MCNO,7096,28(ZMOQH 1):S62-S69 and Standards of Medical Care in         Diabetes - 2011,Diabetes Care,2011,34  (Suppl 1):S11-S61.    Review of Glycemic Control Results for LEXIANA, SPINDEL (MRN [id number]) as of 02/19/2018 13:06  Ref. Range 02/17/2018 08:03 02/17/2018 14:43 02/19/2018 07:30  Glucose-Capillary Latest Ref Range: 65 - 99 mg/dL 102 (H) 174 (H) 252 (H)   Diabetes history: Admit with: Abd Pain/ Sepsis. Resp Failure History: Crack Cocaine Abuse, ETOH Abuse  NO History of DM? Outpatient Diabetes medications: none Current orders for Inpatient glycemic control: none  Inpatient Diabetes Program Recommendations:    No recent A1C noted, consider repeating A1C? In 2011, result was "H". No further notes from PCP, no care everywhere notes.   Noted that patient has received Solumedrol 250 mg Q6H. In setting of steroids and infection, consider starting Lantus 15 units QD, Novolog 0-9 units TID, Novolog 0-5 units QHS and Novolog 3 units TID (assuming patient is consuming >50%). Noted long term steroid use for treatment, patient will most likely need adjustments to insulin if tapered.  If disposition plan to go home, need to consider  outpatient plan such as Novolin 70/30. Will continue to follow.   Thanks, Bronson Curb, MSN, RNC-OB Diabetes Coordinator [phone] (8a-5p)

## 2018-02-19 NOTE — Procedures (Signed)
I was present at this dialysis session. I have reviewed the session itself and made appropriate changes.   See my note from yesterday PM.  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    Pulm changed steroids to 250mg  solumedrol q6h x4d which is adequate for renal issues.  Today rec HD x1 followed by TPE: 1PV, Albumin, 2u FFP at end of Tx.  HD going well. 2h, Qb 250, 1L UF, 2K bath  Filed Weights   02/15/18 0500 02/18/18 0500 02/19/18 0500  Weight: 73.2 kg (161 lb 6 oz) 75.9 kg (167 lb 5.3 oz) 73.8 kg (162 lb 12.8 oz)    Recent Labs  Lab 02/17/18 0232  02/19/18 0308  NA 138   < > 135  K 4.8   < > 5.6*  CL 100*   < > 98*  CO2 27   < > 24  GLUCOSE 177*   < > 337*  BUN 86*   < > 110*  CREATININE 7.75*   < > 7.45*  CALCIUM 7.3*   < > 7.7*  PHOS 5.5*  --   --    < > = values in this interval not displayed.    Recent Labs  Lab 02/15/18 0342 02/16/18 0233 02/18/18 0315 02/19/18 0859  WBC 14.2* 13.7* 8.5 11.9*  NEUTROABS 12.6* 12.0* 7.6  --   HGB 6.8* 11.0* 10.3* 10.5*  HCT 21.0* 33.2* 31.8* 32.5*  MCV 74.7* 79.0 79.7 79.9  PLT 274 283 285 310    Scheduled Meds: . calcium carbonate  2 tablet Oral Q3H  . Chlorhexidine Gluconate Cloth  6 each Topical Q0600  . cyclophosphamide  100 mg Oral Daily  . famotidine  20 mg Oral Daily  . heparin  1,000 Units Intracatheter Once  . multivitamin with minerals  1 tablet Oral Daily  . sodium chloride flush  3 mL Intravenous Q12H  . thiamine  100 mg Oral Daily   Continuous Infusions: . sodium chloride    . sodium chloride    . sodium chloride    . therapeutic plasma exchange solution    . calcium gluconate IVPB    . citrate dextrose     PRN Meds:.sodium chloride, sodium chloride, acetaminophen **OR** acetaminophen, acetaminophen, alteplase, diphenhydrAMINE, heparin, hydrALAZINE, HYDROcodone-acetaminophen, HYDROmorphone (DILAUDID) injection, lidocaine (PF), lidocaine-prilocaine, ondansetron **OR** ondansetron  (ZOFRAN) IV, pentafluoroprop-tetrafluoroeth, senna-docusate, zolpidem   Pearson Grippe  MD 02/19/2018, 9:11 AM

## 2018-02-19 NOTE — Progress Notes (Signed)
Name: Rhonda Lynch MRN: [id number] DOB: 1956/04/18    ADMISSION DATE:  02/12/2018 CONSULTATION DATE:  02/17/2018  REFERRING MD :  Dr. Cathlean Sauer TRH, Dr. Joelyn Oms Nephrology.  CHIEF COMPLAINT:  Diffuse pulmonary infiltrates.   HISTORY OF PRESENT ILLNESS:  62 year old female with PMH significant for cocaine (has smoked crack in the  and alcohol abuse who presented to [facility] ED 6/5 with complaints of abdominal pain with associated nausea and vomiting. Also reported SOB and productive cough with blood tinged and streaks in sputum. Complained of dark urine as well.  Upon arrivl to the ED, she was found to be hypoxemic with O2 sats in the mid 80s on room air with tachypnea. 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 Labs were notable to Creatinine 7.58 and hemoglobin 7.3. UA significant for hematuria and proteinuria She was admitted by the hospitalists and treated with broad spectrum antibiotics.   She was evaluated by nephrology, who initially felt as though the renal failure was hemodynamically mediated due to preceding events and heavy NSAID use. 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 Underwent renal biopsy 6/10. Course complicated by progressive hypoxia escalating up to 10L high flow [place]. PCCM asked to evaluate with concern that vasculitis may be unifying diagnosis for respiratory and renal failure.   SIGNIFICANT EVENTS  6/5 admit with abd pain and hypoxia, AKI found.  6/10 renal biopsy. STUDIES: 6/5 CT abdomen/pelvis > No acute intra-abdominal process. Confluent ground-glass density and inter and intralobular septal thickening at the right greater than left lung bases, consistent with crazy paving pattern. While this pattern is nonspecific, given the sparing of the subpleural lung, this may be related to cryptogenic organizing  pneumonia. Pulmonology consultation is recommended. 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 6/7 Renal US >  Increased renal cortical echogenicity bilaterally, consistent with medical renal disease. 6/10 Kidney biopsy >>>    SUBJECTIVE:  No events overnight, no new complaints  VITAL SIGNS: Temp:  [97.8 F (36.6 C)-98.5 F (36.9 C)] 98.5 F (36.9 C) (06/12 1652) Pulse Rate:  [30-91] 69 (06/12 1652) Resp:  [15-23] 16 (06/12 1652) BP: (142-167)/(76-98) 150/98 (06/12 1652) SpO2:  [83 %-100 %] 83 % (06/12 1652) Weight:  [162 lb 12.8 oz (73.8 kg)-165 lb 9.1 oz (75.1 kg)] 165 lb 9.1 oz (75.1 kg) (06/12 0828)  PHYSICAL EXAMINATION: General: Pleasant elderly appearing female, resting comfortably in exam bed, NAD HEENT: [place]/AT, PERRL, EOM-I and MMM Pulmonary: Diminished BS diffusely Cardiac: RRR, Nl S1/S2 and -M/R/G Abdomen: Soft, NT, ND and +BS Neuro: Alert and interactive, moving all ext to command. Recent Labs  Lab 02/17/18 0232 02/18/18 0315 02/19/18 0308 02/19/18 1217  NA 138 137 135 136  K 4.8 4.9 5.6* 4.6  CL 100* 99* 98* 98*  CO2 27 25 24   --   BUN 86* 97* 110* 57*  CREATININE 7.75* 7.80* 7.45* 4.50*  GLUCOSE 177* 277* 337* 341*   Recent Labs  Lab 02/16/18 0233 02/18/18 0315 02/19/18 0859 02/19/18 1217  HGB 11.0* 10.3* 10.5* 10.9*  HCT 33.2* 31.8* 32.5* 32.0*  WBC 13.7* 8.5 11.9*  --   PLT 283 285 310  --    Ct Chest High Resolution  Result Date: 02/18/2018 CLINICAL DATA:  62 year old female with history of alcohol and cocaine abuse presenting with dyspnea and productive cough. Abnormal chest x-ray. EXAM:  CT CHEST WITHOUT CONTRAST TECHNIQUE: Multidetector CT imaging of the chest was performed following the standard protocol without intravenous contrast. High resolution imaging of the lungs, as well as inspiratory and expiratory imaging, was performed. COMPARISON:  No prior chest CT.  Chest x-ray 02/14/2018. FINDINGS: Cardiovascular: Heart size is mildly enlarged.  There is no significant pericardial fluid, thickening or pericardial calcification. Aortic atherosclerosis. No definite coronary artery calcifications. Mediastinum/Nodes: No pathologically enlarged mediastinal or hilar lymph nodes. Please note that accurate exclusion of hilar adenopathy is limited on noncontrast CT scans. Esophagus is unremarkable in appearance. No axillary lymphadenopathy. Lungs/Pleura: High-resolution images demonstrate widespread areas of ground-glass attenuation and septal thickening, as well as widespread areas of peripheral bronchiolectasis. Subpleural sparing is noted, particularly throughout the mid to lower lungs. These findings have no definitive craniocaudal gradient, but appear slightly more pronounced throughout the mid to upper lungs than the lower lungs. No honeycombing. Inspiratory and expiratory imaging is unremarkable. Linear area of scarring or atelectasis in the periphery of the left lower lobe. Trace left pleural effusion lying dependently. Upper Abdomen: Unremarkable. Musculoskeletal: There are no aggressive appearing lytic or blastic lesions noted in the visualized portions of the skeleton. IMPRESSION: 1. The appearance of the lungs is nonspecific, but there are imaging findings that are suggestive of cryptogenic organizing pneumonia (COP). Other differential considerations include both cardiogenic and noncardiogenic edema, however, this is not strongly favored. Evaluation by Pulmonology is strongly recommended. 2. Aortic atherosclerosis. Aortic Atherosclerosis (ICD10-I70.0). Electronically Signed   By: Vinnie Langton M.D.   On: 02/18/2018 08:37   Ir Fluoro Guide Cv Line Right  Result Date: 02/18/2018 INDICATION: 62 year old female with renal failure in need of hemodialysis EXAM: IR RIGHT FLOURO GUIDE CV LINE; IR ULTRASOUND GUIDANCE VASC ACCESS RIGHT MEDICATIONS: None ANESTHESIA/SEDATION: None FLUOROSCOPY TIME:  Fluoroscopy Time: 0 minutes 6 seconds (0 mGy).  COMPLICATIONS: None immediate. PROCEDURE: Informed written consent was obtained from the patient after a thorough discussion of the procedural risks, benefits and alternatives. All questions were addressed. Maximal Sterile Barrier Technique was utilized including caps, mask, sterile gowns, sterile gloves, sterile drape, hand hygiene and skin antiseptic. A timeout was performed prior to the initiation of the procedure. The right internal jugular vein was interrogated with ultrasound and found to be widely patent. An image was obtained and stored for the medical record. Local anesthesia was attained by infiltration with 1% lidocaine. A small dermatotomy was made. Under real-time sonographic guidance, the vessel was punctured with a 21 gauge micropuncture needle. Using standard technique, the initial micro needle was exchanged over a 0.018 micro wire for a transitional 4 [place] micro sheath. The micro sheath was then exchanged over a 0.035 wire for a dilator which was used to dilate the soft tissue tract. A 20 cm 3 lumen non tunneled dialysis catheter was then advanced over the wire and position with the tip in the upper right atrium. The catheter flushed and aspirated with ease. The catheter was flushed with heparinized saline and secured to the skin with 0 Prolene suture. The patient tolerated the procedure well. IMPRESSION: Successful placement of a non tunneled Trialysis catheter via the right internal jugular vein. The catheter tip is in the upper right atrium and ready for immediate use. Signed, Criselda Peaches, MD [facility] Radiology Electronically Signed   By: Jacqulynn Cadet M.D.   On: 02/18/2018 16:08   Ir US Guide Vasc Access Right  Result Date: 02/18/2018 INDICATION: 62 year old female with renal failure in need  of hemodialysis EXAM: IR RIGHT FLOURO GUIDE CV LINE; IR ULTRASOUND GUIDANCE VASC ACCESS RIGHT MEDICATIONS: None ANESTHESIA/SEDATION: None  FLUOROSCOPY TIME:  Fluoroscopy Time: 0 minutes 6 seconds (0 mGy). COMPLICATIONS: None immediate. PROCEDURE: Informed written consent was obtained from the patient after a thorough discussion of the procedural risks, benefits and alternatives. All questions were addressed. Maximal Sterile Barrier Technique was utilized including caps, mask, sterile gowns, sterile gloves, sterile drape, hand hygiene and skin antiseptic. A timeout was performed prior to the initiation of the procedure. The right internal jugular vein was interrogated with ultrasound and found to be widely patent. An image was obtained and stored for the medical record. Local anesthesia was attained by infiltration with 1% lidocaine. A small dermatotomy was made. Under real-time sonographic guidance, the vessel was punctured with a 21 gauge micropuncture needle. Using standard technique, the initial micro needle was exchanged over a 0.018 micro wire for a transitional 4 [place] micro sheath. The micro sheath was then exchanged over a 0.035 wire for a dilator which was used to dilate the soft tissue tract. A 20 cm 3 lumen non tunneled dialysis catheter was then advanced over the wire and position with the tip in the upper right atrium. The catheter flushed and aspirated with ease. The catheter was flushed with heparinized saline and secured to the skin with 0 Prolene suture. The patient tolerated the procedure well. IMPRESSION: Successful placement of a non tunneled Trialysis catheter via the right internal jugular vein. The catheter tip is in the upper right atrium and ready for immediate use. Signed, Criselda Peaches, MD [facility] Radiology Electronically Signed   By: Jacqulynn Cadet M.D.   On: 02/18/2018 16:08    ASSESSMENT / PLAN:  62 year old female with history of cocaine abuse who presents to the hospital with SOB and renal failure.  Patient underwent a renal biopsy and results are  pending.  PCCM was consulted for concern for DAH, pulmonary infiltrate and hypoxemia.  Patient has been on rocephin for 8 days at this point with no improvement.  On exam, diffuse crackles noted.  I reviewed chest CT myself, diffuse infiltrate sparing the subpleural areas.  Discussed with PCCM-NP and nephrology-MD.  Pulmonary infiltrate: classic for BOOP but other etiologies such as crack lung and pulmonary edema can not be ruled out.  Unlikely to be an infection with abx x7 days and the auto-immune presentation.  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  PCCM will continue to follow.  Rush Farmer, M.D. [facility] Pulmonary/Critical Care Medicine. Pager: [phone]. After hours pager: [phone].

## 2018-02-19 NOTE — Progress Notes (Signed)
Physical Therapy Treatment Patient Details Name: Rhonda Lynch MRN: [id number] DOB: Sep 27, 1955 Today's Date: 02/19/2018    History of Present Illness Pt is a 61 yo female presenting with epigastric pain and was tachypneic. PMH: cocaine and alcohol abuse. Pt found to have PNA, microctic anemia, renal failurs and HTN urgency.    PT Comments    Pt making good progress towards her goals, however she is limited in safe mobility by mild instability, and requires supplemental oxygen. Pt currently mod I for bed mobility and transfers and requires min A for occasional LoB with gait without AD. Pt does not want to try RW at this time. With pt's improvement in mobilization D/c plans should be changed to HHPT. PT will continue to follow acutely to progress mobility and practice stairs before d/c.     Follow Up Recommendations  Supervision/Assistance - 24 hour;Home health PT     Equipment Recommendations  None recommended by PT       Precautions / Restrictions Precautions Precautions: Fall Precaution Comments: watch O2 Restrictions Weight Bearing Restrictions: No    Mobility  Bed Mobility Overal bed mobility: Needs Assistance Bed Mobility: Supine to Sit     Supine to sit: HOB elevated;Modified independent (Device/Increase time)     General bed mobility comments: pt long sitting in bed on entry and sat EoB with mod I  Transfers Overall transfer level: Needs assistance Equipment used: None Transfers: Sit to/from Stand Sit to Stand: Supervision         General transfer comment: supervision for safety  Ambulation/Gait Ambulation/Gait assistance: Min assist;Min guard Ambulation Distance (Feet): 200 Feet Assistive device: None Gait Pattern/deviations: Step-through pattern;Decreased stride length;Trunk flexed;Narrow base of support Gait velocity: slow Gait velocity interpretation: <1.8 ft/sec, indicate of risk for recurrent falls General Gait Details: hands on min guard, pt with  instability requiring 2x minA to steady herself. Pt also reached out to handrails in hall x2 to steady herself          Balance Overall balance assessment: Needs assistance Sitting-balance support: Feet supported;No upper extremity supported Sitting balance-Leahy Scale: Good Sitting balance - Comments: pt able to don socks at EOB without difficulty   Standing balance support: No upper extremity supported;During functional activity Standing balance-Leahy Scale: Fair Standing balance comment: able to static stand without support                             Cognition Arousal/Alertness: Awake/alert Behavior During Therapy: WFL for tasks assessed/performed Overall Cognitive Status: Within Functional Limits for tasks assessed                                           General Comments General comments (skin integrity, edema, etc.): Pt on 4 L O2 via nasal cannula at rest 98%O2, with ambulation SaO2  maintained >90%O2       Pertinent Vitals/Pain Pain Assessment: No/denies pain           PT Goals (current goals can now be found in the care plan section) Acute Rehab PT Goals Patient Stated Goal: get better PT Goal Formulation: With patient Time For Goal Achievement: 03/01/18 Potential to Achieve Goals: Good    Frequency    Min 3X/week      PT Plan Discharge plan needs to be updated       AM-PAC PT "  6 Clicks" Daily Activity  Outcome Measure  Difficulty turning over in bed (including adjusting bedclothes, sheets and blankets)?: None Difficulty moving from lying on back to sitting on the side of the bed? : None Difficulty sitting down on and standing up from a chair with arms (e.g., wheelchair, bedside commode, etc,.)?: None Help needed moving to and from a bed to chair (including a wheelchair)?: None Help needed walking in hospital room?: A Little Help needed climbing 3-5 steps with a railing? : A Little 6 Click Score: 22    End of  Session Equipment Utilized During Treatment: Gait belt Activity Tolerance: Patient tolerated treatment well Patient left: in chair;with call bell/phone within reach;with chair alarm set Nurse Communication: Mobility status(SpO2 at 82% on 8LO2 with mobility) PT Visit Diagnosis: Unsteadiness on feet (R26.81);Difficulty in walking, not elsewhere classified (R26.2)     Time: 9528-4132 PT Time Calculation (min) (ACUTE ONLY): 15 min  Charges:  $Gait Training: 8-22 mins                    G Codes:       Kj Imbert B. Migdalia Dk PT, DPT Acute Rehabilitation  [phone] Pager [phone]     [place] 02/19/2018, 5:36 PM

## 2018-02-20 DIAGNOSIS — R042 Hemoptysis: Secondary | ICD-10-CM

## 2018-02-20 DIAGNOSIS — I288 Other diseases of pulmonary vessels: Secondary | ICD-10-CM

## 2018-02-20 DIAGNOSIS — R918 Other nonspecific abnormal finding of lung field: Secondary | ICD-10-CM

## 2018-02-20 LAB — PREPARE FRESH FROZEN PLASMA: Unit division: 0

## 2018-02-20 LAB — GLUCOSE, CAPILLARY
Glucose-Capillary: 305 mg/dL — ABNORMAL HIGH (ref 65–99)
Glucose-Capillary: 429 mg/dL — ABNORMAL HIGH (ref 65–99)
Glucose-Capillary: 379 mg/dL — ABNORMAL HIGH (ref 65–99)

## 2018-02-20 LAB — POCT I-STAT, CHEM 8
BUN: 40 mg/dL — ABNORMAL HIGH (ref 6–20)
CALCIUM ION: 1.02 mmol/L — AB (ref 1.15–1.40)
CREATININE: 3.1 mg/dL — AB (ref 0.44–1.00)
Chloride: 100 mmol/L — ABNORMAL LOW (ref 101–111)
Glucose, Bld: 368 mg/dL — ABNORMAL HIGH (ref 65–99)
HEMATOCRIT: 31 % — AB (ref 36.0–46.0)
HEMOGLOBIN: 10.5 g/dL — AB (ref 12.0–15.0)
Potassium: 4.2 mmol/L (ref 3.5–5.1)
Sodium: 137 mmol/L (ref 135–145)
TCO2: 23 mmol/L (ref 22–32)

## 2018-02-20 LAB — BPAM FFP
BLOOD PRODUCT EXPIRATION DATE: 201906162359
BLOOD PRODUCT EXPIRATION DATE: 201906162359
ISSUE DATE / TIME: 201906121002
ISSUE DATE / TIME: 201906121002
UNIT TYPE AND RH: 6200
UNIT TYPE AND RH: 6200

## 2018-02-20 LAB — HEPATITIS B SURFACE ANTIBODY,QUALITATIVE: HEP B S AB: NONREACTIVE

## 2018-02-20 LAB — BASIC METABOLIC PANEL
ANION GAP: 9 (ref 5–15)
BUN: 80 mg/dL — ABNORMAL HIGH (ref 6–20)
CALCIUM: 8.2 mg/dL — AB (ref 8.9–10.3)
CHLORIDE: 103 mmol/L (ref 101–111)
CO2: 25 mmol/L (ref 22–32)
Creatinine, Ser: 5.61 mg/dL — ABNORMAL HIGH (ref 0.44–1.00)
GFR calc non Af Amer: 7 mL/min — ABNORMAL LOW (ref >60)
GFR, EST AFRICAN AMERICAN: 8 mL/min — AB (ref >60)
GLUCOSE: 388 mg/dL — AB (ref 65–99)
POTASSIUM: 5.4 mmol/L — AB (ref 3.5–5.1)
Sodium: 137 mmol/L (ref 135–145)

## 2018-02-20 LAB — HEPATITIS B CORE ANTIBODY, TOTAL: Hep B Core Total Ab: NEGATIVE

## 2018-02-20 MED ORDER — PREDNISONE 20 MG PO TABS
40.0000 mg | ORAL_TABLET | Freq: Every day | ORAL | Status: DC
  Administered 2018-02-21: 40 mg via ORAL
  Filled 2018-02-20: qty 2

## 2018-02-20 MED ORDER — ACETAMINOPHEN 325 MG PO TABS: 650.0000 mg | ORAL_TABLET | ORAL | Status: DC | PRN

## 2018-02-20 MED ORDER — DIPHENHYDRAMINE HCL 25 MG PO CAPS: 25.0000 mg | ORAL_CAPSULE | Freq: Four times a day (QID) | ORAL | Status: DC | PRN

## 2018-02-20 MED ORDER — SULFAMETHOXAZOLE-TRIMETHOPRIM 800-160 MG PO TABS
1.0000 | ORAL_TABLET | ORAL | Status: DC
  Administered 2018-02-21 – 2018-03-14 (×8): 1 via ORAL
  Filled 2018-02-20 (×8): qty 1

## 2018-02-20 MED ORDER — INSULIN ASPART 100 UNIT/ML ~~LOC~~ SOLN
3.0000 [IU] | Freq: Three times a day (TID) | SUBCUTANEOUS | Status: DC
  Administered 2018-02-20 – 2018-03-15 (×50): 3 [IU] via SUBCUTANEOUS

## 2018-02-20 MED ORDER — SODIUM CHLORIDE 0.9 % IV SOLN
2.0000 g | Freq: Once | INTRAVENOUS | Status: AC
  Administered 2018-02-20: 2 g via INTRAVENOUS
  Filled 2018-02-20: qty 20

## 2018-02-20 MED ORDER — HEPARIN SODIUM (PORCINE) 1000 UNIT/ML IJ SOLN: 1000.0000 [IU] | Freq: Once | INTRAMUSCULAR | Status: DC

## 2018-02-20 MED ORDER — SODIUM CHLORIDE 0.9 % IV SOLN
INTRAVENOUS | Status: DC
  Administered 2018-02-20 (×2): via INTRAVENOUS_CENTRAL
  Filled 2018-02-20 (×3): qty 200

## 2018-02-20 MED ORDER — SODIUM CHLORIDE 0.9 % IV SOLN: Freq: Once | INTRAVENOUS | Status: DC

## 2018-02-20 MED ORDER — ACD FORMULA A 0.73-2.45-2.2 GM/100ML VI SOLN
500.0000 mL | Status: DC
  Administered 2018-02-20: 500 mL via INTRAVENOUS

## 2018-02-20 MED ORDER — CALCIUM CARBONATE ANTACID 500 MG PO CHEW
CHEWABLE_TABLET | ORAL | Status: AC
  Administered 2018-02-20: 400 mg via ORAL
  Filled 2018-02-20: qty 1

## 2018-02-20 MED ORDER — ACD FORMULA A 0.73-2.45-2.2 GM/100ML VI SOLN
Status: AC
  Administered 2018-02-20: 500 mL via INTRAVENOUS
  Filled 2018-02-20: qty 500

## 2018-02-20 MED ORDER — SODIUM CHLORIDE 0.9 % IV SOLN
250.0000 mg | Freq: Four times a day (QID) | INTRAVENOUS | Status: AC
  Administered 2018-02-20 (×2): 250 mg via INTRAVENOUS
  Filled 2018-02-20 (×3): qty 2

## 2018-02-20 MED ORDER — INSULIN ASPART 100 UNIT/ML ~~LOC~~ SOLN
0.0000 [IU] | Freq: Every day | SUBCUTANEOUS | Status: DC
  Administered 2018-02-20: 5 [IU] via SUBCUTANEOUS
  Administered 2018-02-21: 3 [IU] via SUBCUTANEOUS
  Administered 2018-02-23 – 2018-02-26 (×3): 2 [IU] via SUBCUTANEOUS
  Administered 2018-02-27: 3 [IU] via SUBCUTANEOUS
  Administered 2018-02-28: 2 [IU] via SUBCUTANEOUS
  Administered 2018-03-01: 5 [IU] via SUBCUTANEOUS
  Administered 2018-03-02: 3 [IU] via SUBCUTANEOUS
  Administered 2018-03-04: 2 [IU] via SUBCUTANEOUS
  Administered 2018-03-04: 4 [IU] via SUBCUTANEOUS

## 2018-02-20 MED ORDER — CHLORHEXIDINE GLUCONATE CLOTH 2 % EX PADS
6.0000 | MEDICATED_PAD | Freq: Every day | CUTANEOUS | Status: DC
  Administered 2018-02-20 – 2018-02-21 (×2): 6 via TOPICAL

## 2018-02-20 MED ORDER — INSULIN ASPART 100 UNIT/ML ~~LOC~~ SOLN
0.0000 [IU] | Freq: Three times a day (TID) | SUBCUTANEOUS | Status: DC
  Administered 2018-02-20: 10 [IU] via SUBCUTANEOUS

## 2018-02-20 MED ORDER — CALCIUM CARBONATE ANTACID 500 MG PO CHEW
2.0000 | CHEWABLE_TABLET | ORAL | Status: DC
  Administered 2018-02-20: 400 mg via ORAL

## 2018-02-20 NOTE — Progress Notes (Signed)
PROGRESS NOTE    Rhonda Lynch  TIR:[id number] DOB: Sep 25, 1955 DOA: 02/12/2018 PCP: Patient, No Pcp Per   Brief Narrative:  HPI On 02/12/2018 by Dr. Christia Reading Opyd Rhonda Lynch is a 62 y.o. female with medical history significant for cocaine and alcohol abuse, having rarely seen a physician in the last decade, now presenting to the emergency department for evaluation abdominal pain.  Patient reports 1 week of stabbing pain in the epigastrium and called EMS.  She reports associated nausea without vomiting, and upon direct questioning acknowledges recent shortness of breath and productive cough for the past 1 to 2 weeks.  She reports only minimal alcohol use though family notes she is a heavy daily drinker.  Patient reports that she has not smoked crack in years.  She denies fevers, chills, or chest pain.  Reports that her urine has become darker recently.  Reports occasional mild headache without change in vision or hearing or focal numbness or weakness.  She denies melena or hematochezia.  Interim history Admitted for possible sepsis secondary to pneumonia complicated by acute hypoxic respiratory failure and acute kidney injury.   Assessment & Plan   Sepsis secondary to Streptococcus Bilateral pneumonia/Multilobar with cryptogenic organizing pneumonia -Sepsis morphology resolved -Chest x-ray on admission showed confluent airspace disease, R > L -Placed on azithromycin and ceftriaxone, course completed -CT chest showed possible findings suggestive of cryptogenic organizing pneumonia -Patient also found to have vasculitis, suspect this is also affecting her pulmonary system -Pulmonology consulted and appreciated: Recommended steroids, supplemental oxygen, 7 days of Rocephin for strep positive urine antigen -Pulm recommendations: Continue Solu-Medrol, 250 mg IV every 6 through 02/20/2018.  On 02/21/2018, will change to prednisone 40 mg daily -Patient will also need a pulmonology outpatient  follow-up  Acute hypoxic respiratory failure -Secondary to the above versus vasculitis -Treatment and plan as above -Supplemental oxygen, will try to wean if possible -Will monitor oxygen saturations with ambulation to determine home requirements  Acute kidney injury with hyperkalemia amd /rapid progressive glomerulonephritis/MPO ANCA vasculitis -Nephrology consulted and appreciated -Creatinine on admission 7.58 -Status post kidney biopsy on 02/17/2018 -Started hemodialysis  -Cytoxan as well as steroids -Noted on Bactrim on Monday Wednesday Friday -continue to monitor BMP  Non-anion gap metabolic acidosis -resolved   Anemia of chronic disease/anemia of iron deficiency -Hemoglobin as low as 6.7 -Received 2 units PRBC IV iron -Hemoglobin currently 10.9 -Continue to monitor CBC  Alcohol and cocaine abuse -Counseled -Currently not having withdrawal symptoms  Alcohol induced gastritis -Continue famotidine  Physical deconditioning -PT evaluated patient and recommended home health PT, 24-hour supervision. -Case management consulted  DVT Prophylaxis  SCDs  Code Status: Full  Family Communication: None at bedside  Disposition Plan: Admitted.   Consultants Pulmonology Nephrology  Procedures  Renal ultrasound Ultrasound-guided core biopsy of left kidney  Antibiotics   Anti-infectives (From admission, onward)   Start     Dose/Rate Route Frequency Ordered Stop   02/21/18 0900  sulfamethoxazole-trimethoprim (BACTRIM DS,SEPTRA DS) 800-160 MG per tablet 1 tablet     1 tablet Oral Once per day on Mon Wed Fri 02/20/18 1116     02/13/18 1900  cefTRIAXone (ROCEPHIN) 1 g in sodium chloride 0.9 % 100 mL IVPB     1 g 200 mL/hr over 30 Minutes Intravenous Every 24 hours 02/12/18 2122 02/18/18 2014   02/13/18 1900  azithromycin (ZITHROMAX) 500 mg in sodium chloride 0.9 % 250 mL IVPB  Status:  Discontinued     500 mg 250 mL/hr  over 60 Minutes Intravenous Every 24 hours 02/12/18  2122 02/14/18 0819   02/12/18 1915  cefTRIAXone (ROCEPHIN) 2 g in sodium chloride 0.9 % 100 mL IVPB  Status:  Discontinued     2 g 200 mL/hr over 30 Minutes Intravenous Every 24 hours 02/12/18 1901 02/12/18 2125   02/12/18 1915  azithromycin (ZITHROMAX) 500 mg in sodium chloride 0.9 % 250 mL IVPB  Status:  Discontinued     500 mg 250 mL/hr over 60 Minutes Intravenous Every 24 hours 02/12/18 1901 02/12/18 2125      Subjective:   Rhonda Lynch seen and examined today.  Feeling mildly better today.  Denies current chest pain.  Feels her breathing has mildly improved but not back to baseline.  Denies current chest pain, abdominal pain, nausea or vomiting, diarrhea constipation, dizziness or headache.  Objective:   Vitals:   02/20/18 0050 02/20/18 0500 02/20/18 0639 02/20/18 0851  BP: (!) 155/90   (!) 161/91  Pulse: 62   69  Resp: 18   16  Temp: 98 F (36.7 C)   98.3 F (36.8 C)  TempSrc: Axillary   Oral  SpO2: 92%   100%  Weight:  74.4 kg (164 lb) 74.7 kg (164 lb 10.9 oz)   Height:        Intake/Output Summary (Last 24 hours) at 02/20/2018 1121 Last data filed at 02/20/2018 1001 Gross per 24 hour  Intake 106.63 ml  Output -  Net 106.63 ml   Filed Weights   02/19/18 0828 02/20/18 0500 02/20/18 0639  Weight: 75.1 kg (165 lb 9.1 oz) 74.4 kg (164 lb) 74.7 kg (164 lb 10.9 oz)    Exam  General: Well developed, well nourished, NAD, appears stated age  HEENT: NCAT, mucous membranes moist. Poor dentition  Neck: Supple  Cardiovascular: S1 S2 auscultated, no rubs, murmurs or gallops. Regular rate and rhythm.  Respiratory: Clear to auscultation bilaterally with equal chest rise  Abdomen: Soft, nontender, nondistended, + bowel sounds  Extremities: warm dry without cyanosis clubbing or edema  Neuro: AAOx3, nonfocal  Skin: Without rashes exudates or nodules  Psych: Appropriate mood and affect, pleasant   Data Reviewed: I have personally reviewed following labs and imaging  studies  CBC: Recent Labs  Lab 02/14/18 0333 02/15/18 0342 02/16/18 0233 02/18/18 0315 02/19/18 0859 02/19/18 1217  WBC 12.6* 14.2* 13.7* 8.5 11.9*  --   NEUTROABS 11.1* 12.6* 12.0* 7.6  --   --   HGB 7.7* 6.8* 11.0* 10.3* 10.5* 10.9*  HCT 24.5* 21.0* 33.2* 31.8* 32.5* 32.0*  MCV 77.8* 74.7* 79.0 79.7 79.9  --   PLT 261 274 283 285 310  --    Basic Metabolic Panel: Recent Labs  Lab 02/16/18 0233 02/17/18 0232 02/18/18 0315 02/19/18 0308 02/19/18 1217 02/20/18 0303  NA 140 138 137 135 136 137  K 4.3 4.8 4.9 5.6* 4.6 5.4*  CL 101 100* 99* 98* 98* 103  CO2 26 27 25 24   --  25  GLUCOSE 190* 177* 277* 337* 341* 388*  BUN 79* 86* 97* 110* 57* 80*  CREATININE 7.83* 7.75* 7.80* 7.45* 4.50* 5.61*  CALCIUM 7.2* 7.3* 7.6* 7.7*  --  8.2*  PHOS  --  5.5*  --   --   --   --    GFR: Estimated Creatinine Clearance: 9.4 mL/min (A) (by C-G formula based on SCr of 5.61 mg/dL (H)). Liver Function Tests: Recent Labs  Lab 02/17/18 0232  ALBUMIN 2.0*   No results  for input(s): LIPASE, AMYLASE in the last 168 hours. No results for input(s): AMMONIA in the last 168 hours. Coagulation Profile: No results for input(s): INR, PROTIME in the last 168 hours. Cardiac Enzymes: Recent Labs  Lab 02/14/18 1115  CKTOTAL 210   BNP (last 3 results) No results for input(s): PROBNP in the last 8760 hours. HbA1C: No results for input(s): HGBA1C in the last 72 hours. CBG: Recent Labs  Lab 02/16/18 0818 02/17/18 0803 02/17/18 1443 02/19/18 0730 02/20/18 0852  GLUCAP 137* 102* 174* 252* 305*   Lipid Profile: No results for input(s): CHOL, HDL, LDLCALC, TRIG, CHOLHDL, LDLDIRECT in the last 72 hours. Thyroid Function Tests: No results for input(s): TSH, T4TOTAL, FREET4, T3FREE, THYROIDAB in the last 72 hours. Anemia Panel: No results for input(s): VITAMINB12, FOLATE, FERRITIN, TIBC, IRON, RETICCTPCT in the last 72 hours. Urine analysis:    Component Value Date/Time   COLORURINE YELLOW  02/12/2018 1801   APPEARANCEUR HAZY (A) 02/12/2018 1801   LABSPEC 1.013 02/12/2018 1801   PHURINE 5.0 02/12/2018 1801   GLUCOSEU NEGATIVE 02/12/2018 1801   HGBUR LARGE (A) 02/12/2018 1801   HGBUR trace-intact 03/08/2010 1452   BILIRUBINUR NEGATIVE 02/12/2018 1801   KETONESUR NEGATIVE 02/12/2018 1801   PROTEINUR 100 (A) 02/12/2018 1801   UROBILINOGEN 0.2 03/08/2010 1452   NITRITE NEGATIVE 02/12/2018 1801   LEUKOCYTESUR NEGATIVE 02/12/2018 1801   Sepsis Labs: @LABRCNTIP (procalcitonin:4,lacticidven:4)  ) Recent Results (from the past 240 hour(s))  Culture, blood (routine x 2)     Status: None   Collection Time: 02/12/18  7:50 PM  Result Value Ref Range Status   Specimen Description BLOOD RIGHT ANTECUBITAL  Final   Special Requests   Final    BOTTLES DRAWN AEROBIC AND ANAEROBIC Blood Culture adequate volume   Culture   Final    NO GROWTH 5 DAYS Performed at [facility], [place] [address] [place], [place] [address]    Report Status 02/17/2018 FINAL  Final  Culture, blood (routine x 2)     Status: None   Collection Time: 02/12/18  8:07 PM  Result Value Ref Range Status   Specimen Description BLOOD LEFT ANTECUBITAL  Final   Special Requests   Final    BOTTLES DRAWN AEROBIC AND ANAEROBIC Blood Culture adequate volume   Culture   Final    NO GROWTH 5 DAYS Performed at [facility], [place] [address] [place], [place] [address]    Report Status 02/17/2018 FINAL  Final  Respiratory Panel by PCR     Status: None   Collection Time: 02/13/18  8:48 AM  Result Value Ref Range Status   Adenovirus NOT DETECTED NOT DETECTED Final   Coronavirus 229E NOT DETECTED NOT DETECTED Final   Coronavirus HKU1 NOT DETECTED NOT DETECTED Final   Coronavirus NL63 NOT DETECTED NOT DETECTED Final   Coronavirus OC43 NOT DETECTED NOT DETECTED Final   Metapneumovirus NOT DETECTED NOT DETECTED Final   Rhinovirus / Enterovirus NOT DETECTED NOT DETECTED Final   Influenza A NOT DETECTED NOT DETECTED  Final   Influenza B NOT DETECTED NOT DETECTED Final   Parainfluenza Virus 1 NOT DETECTED NOT DETECTED Final   Parainfluenza Virus 2 NOT DETECTED NOT DETECTED Final   Parainfluenza Virus 3 NOT DETECTED NOT DETECTED Final   Parainfluenza Virus 4 NOT DETECTED NOT DETECTED Final   Respiratory Syncytial Virus NOT DETECTED NOT DETECTED Final   Bordetella pertussis NOT DETECTED NOT DETECTED Final   Chlamydophila pneumoniae NOT DETECTED NOT DETECTED  Final   Mycoplasma pneumoniae NOT DETECTED NOT DETECTED Final  MRSA PCR Screening     Status: None   Collection Time: 02/13/18  1:55 PM  Result Value Ref Range Status   MRSA by PCR NEGATIVE NEGATIVE Final    Comment:        The GeneXpert MRSA Assay (FDA approved for NASAL specimens only), is one component of a comprehensive MRSA colonization surveillance program. It is not intended to diagnose MRSA infection nor to guide or monitor treatment for MRSA infections. Performed at [facility], [place] [address] [place],  [address]       Radiology Studies: Ir Cyndy Freeze Guide Cv Line Right  Result Date: 02/18/2018 INDICATION: 62 year old female with renal failure in need of hemodialysis EXAM: IR RIGHT FLOURO GUIDE CV LINE; IR ULTRASOUND GUIDANCE VASC ACCESS RIGHT MEDICATIONS: None ANESTHESIA/SEDATION: None FLUOROSCOPY TIME:  Fluoroscopy Time: 0 minutes 6 seconds (0 mGy). COMPLICATIONS: None immediate. PROCEDURE: Informed written consent was obtained from the patient after a thorough discussion of the procedural risks, benefits and alternatives. All questions were addressed. Maximal Sterile Barrier Technique was utilized including caps, mask, sterile gowns, sterile gloves, sterile drape, hand hygiene and skin antiseptic. A timeout was performed prior to the initiation of the procedure. The right internal jugular vein was interrogated with ultrasound and found to be widely patent. An image was obtained and stored for the medical record. Local  anesthesia was attained by infiltration with 1% lidocaine. A small dermatotomy was made. Under real-time sonographic guidance, the vessel was punctured with a 21 gauge micropuncture needle. Using standard technique, the initial micro needle was exchanged over a 0.018 micro wire for a transitional 4 [place] micro sheath. The micro sheath was then exchanged over a 0.035 wire for a dilator which was used to dilate the soft tissue tract. A 20 cm 3 lumen non tunneled dialysis catheter was then advanced over the wire and position with the tip in the upper right atrium. The catheter flushed and aspirated with ease. The catheter was flushed with heparinized saline and secured to the skin with 0 Prolene suture. The patient tolerated the procedure well. IMPRESSION: Successful placement of a non tunneled Trialysis catheter via the right internal jugular vein. The catheter tip is in the upper right atrium and ready for immediate use. Signed, Criselda Peaches, MD [facility] Electronically Signed   By: Jacqulynn Cadet M.D.   On: 02/18/2018 16:08   Ir US Guide Vasc Access Right  Result Date: 02/18/2018 INDICATION: 62 year old female with renal failure in need of hemodialysis EXAM: IR RIGHT FLOURO GUIDE CV LINE; IR ULTRASOUND GUIDANCE VASC ACCESS RIGHT MEDICATIONS: None ANESTHESIA/SEDATION: None FLUOROSCOPY TIME:  Fluoroscopy Time: 0 minutes 6 seconds (0 mGy). COMPLICATIONS: None immediate. PROCEDURE: Informed written consent was obtained from the patient after a thorough discussion of the procedural risks, benefits and alternatives. All questions were addressed. Maximal Sterile Barrier Technique was utilized including caps, mask, sterile gowns, sterile gloves, sterile drape, hand hygiene and skin antiseptic. A timeout was performed prior to the initiation of the procedure. The right internal jugular vein was interrogated with ultrasound and found to be widely  patent. An image was obtained and stored for the medical record. Local anesthesia was attained by infiltration with 1% lidocaine. A small dermatotomy was made. Under real-time sonographic guidance, the vessel was punctured with a 21 gauge micropuncture needle. Using standard technique, the initial micro needle was exchanged over a 0.018 micro wire for  a transitional 4 [place] micro sheath. The micro sheath was then exchanged over a 0.035 wire for a dilator which was used to dilate the soft tissue tract. A 20 cm 3 lumen non tunneled dialysis catheter was then advanced over the wire and position with the tip in the upper right atrium. The catheter flushed and aspirated with ease. The catheter was flushed with heparinized saline and secured to the skin with 0 Prolene suture. The patient tolerated the procedure well. IMPRESSION: Successful placement of a non tunneled Trialysis catheter via the right internal jugular vein. The catheter tip is in the upper right atrium and ready for immediate use. Signed, Criselda Peaches, MD [facility] Electronically Signed   By: Jacqulynn Cadet M.D.   On: 02/18/2018 16:08     Scheduled Meds: . Chlorhexidine Gluconate Cloth  6 each Topical Q0600  . Chlorhexidine Gluconate Cloth  6 each Topical Q0600  . cyclophosphamide  100 mg Oral Daily  . famotidine  20 mg Oral Daily  . multivitamin with minerals  1 tablet Oral Daily  . sodium chloride flush  3 mL Intravenous Q12H  . [START ON 02/21/2018] sulfamethoxazole-trimethoprim  1 tablet Oral Once per day on Mon Wed Fri  . thiamine  100 mg Oral Daily   Continuous Infusions: . sodium chloride    . sodium chloride    . methylPREDNISolone (SOLU-MEDROL) injection 250 mg (02/20/18 1001)     LOS: 8 days   Time Spent in minutes   45 minutes   Timmia Cogburn D.O. on 02/20/2018 at 11:21 AM  Between 7am to 7pm - Pager - [phone]  After 7pm go to [email or web address] -  password [email or web address]  And look for the night coverage person covering for me after hours  [facility] Office  [phone]

## 2018-02-20 NOTE — Progress Notes (Signed)
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 Chlorhexidine Gluconate Cloth  6 each Topical Q0600  . Chlorhexidine Gluconate Cloth  6 each Topical Q0600  . cyclophosphamide  100 mg Oral Daily  . famotidine  20 mg Oral Daily  . multivitamin with minerals  1 tablet Oral Daily  . sodium chloride flush  3 mL Intravenous Q12H  . thiamine  100 mg Oral Daily   Continuous Infusions: . sodium chloride    . sodium chloride    . 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 The appearance of the lungs is nonspecific, but there are imaging findings that are suggestive of cryptogenic organizing pneumonia (COP). Other differential considerations include both cardiogenic and noncardiogenic edema, however, this is not strongly favored. 2. Aortic atherosclerosis.   Ref. Range 02/14/2018 11:15  Myeloperoxidase Abs Latest Ref Range: 0.0 - 9.0 U/mL 44.7 (H)    Ref. Range 02/14/2018 11:15  ANCA Proteinase 3 Latest Ref Range: 0.0 - 3.5 U/mL <3.5    Ref. Range 02/12/2018 23:44  Anti Nuclear Antibody(ANA) Latest Ref Range: Negative  Positive (A)    Ref. Range 02/12/2018 23:44  HIV Screen 4th Generation wRfx Latest Ref Range:  Non Reactive  Non Reactive    Ref. Range 02/17/2018 01:50  Protein Creatinine Ratio Latest Ref Range: 0.00 - 0.15 mg/mgCre 2.52 (H)   Results for PAYSEN, GOZA (MRN [id number]) as of 02/20/2018 11:13  Ref. 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 Increased echogenicity. No mass or hydronephrosis visualized. Left Kidney: Length: 10.9 cm. Increased echogenicity. No mass or hydronephrosis Visualized. Bladder: Decompressed by a Foley catheter. IMPRESSION: Increased renal cortical echogenicity bilaterally, consistent with medical renal disease  Physical Exam:  Blood pressure (!) 161/91, pulse 69, temperature 98.3 F (36.8 C), temperature source Oral, resp. rate 16, height 5\' 1"  (1.549 m), weight 74.7 kg (164 lb 10.9 oz), SpO2 100 %. NAD, lying prone RRR CTAB No LEE No rashes/noted R IJ Temp HD cath present  A 1. 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 Started TPE, 1PV x7, Albumin on 6/12 2. Started HD on 6/12 3. Solumedrol Pulse started 6/11 4. Started cyclophosphamide 100mg /d 02/18/18, monitor for leukopenia 5. TMP/SMX DS for PCP prevention start 02/20/18 2. Cocaine use 6/5 UDS positive for cocaine 3. S pneumoniae PNA on ceftriaxone; also on steroids 4. 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 NSAID use prior to admission  P 1. TPE #  2 today; 2u FFP at end given recent renal bx on 6/10 2. HD #2 today, 2K, 1L UF, no heparin 3. Start TMP/SMX for PCP prophylaxis 4. Start Pred 60mg /d tomorrow after 3d of pulse solumedrol   Pearson Grippe MD 02/20/2018, 11:12 AM  Recent Labs  Lab 02/17/18 0232 02/18/18 0315 02/19/18 0308 02/19/18 1217 02/20/18 0303  NA 138 137 135 136 137  K 4.8 4.9 5.6* 4.6 5.4*  CL 100* 99*  98* 98* 103  CO2 27 25 24   --  25  GLUCOSE 177* 277* 337* 341* 388*  BUN 86* 97* 110* 57* 80*  CREATININE 7.75* 7.80* 7.45* 4.50* 5.61*  CALCIUM 7.3* 7.6* 7.7*  --  8.2*  PHOS 5.5*  --   --   --   --    Recent Labs  Lab 02/15/18 0342 02/16/18 0233 02/18/18 0315 02/19/18 0859 02/19/18 1217  WBC 14.2* 13.7* 8.5 11.9*  --   NEUTROABS 12.6* 12.0* 7.6  --   --   HGB 6.8* 11.0* 10.3* 10.5* 10.9*  HCT 21.0* 33.2* 31.8* 32.5* 32.0*  MCV 74.7* 79.0 79.7 79.9  --   PLT 274 283 285 310  --

## 2018-02-20 NOTE — Progress Notes (Signed)
CBG 429. MD notified. Received verbal order to give 13 units insulin.

## 2018-02-20 NOTE — Progress Notes (Addendum)
Name: Rhonda Lynch MRN: [id number] DOB: 1956/02/06    ADMISSION DATE:  02/12/2018 CONSULTATION DATE:  02/17/2018  REFERRING MD :  Dr. Cathlean Sauer [facility], Dr. Joelyn Oms Nephrology.  CHIEF COMPLAINT:  Diffuse pulmonary infiltrates.   HISTORY OF PRESENT ILLNESS:  62 year old female with PMH significant for cocaine (has smoked crack in the  and alcohol abuse who presented to [facility] ED 6/5 with complaints of abdominal pain with associated nausea and vomiting. Also reported SOB and productive cough with blood tinged and streaks in sputum. Complained of dark urine as well.  Upon arrivl to the ED, she was found to be hypoxemic with O2 sats in the mid 80s on room air with tachypnea. 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 Labs were notable to Creatinine 7.58 and hemoglobin 7.3. UA significant for hematuria and proteinuria She was admitted by the hospitalists and treated with broad spectrum antibiotics.   She was evaluated by nephrology, who initially felt as though the renal failure was hemodynamically mediated due to preceding events and heavy NSAID use. 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 Underwent renal biopsy 6/10. Course complicated by progressive hypoxia escalating up to 10L high flow [place]. PCCM asked to evaluate with concern that vasculitis may be unifying diagnosis for respiratory and renal failure.   SIGNIFICANT EVENTS  6/5 admit with abd pain and hypoxia, AKI found.  6/10 renal biopsy. STUDIES: 6/5 CT abdomen/pelvis > No acute intra-abdominal process. Confluent ground-glass density and inter and intralobular septal thickening at the right greater than left lung bases, consistent with crazy paving pattern. While this pattern is nonspecific, given the sparing of the subpleural lung, this may be related to cryptogenic organizing  pneumonia. Pulmonology consultation is recommended. 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 6/7 Renal US >  Increased renal cortical echogenicity bilaterally, consistent with medical renal disease. 6/10 Kidney biopsy >>>    SUBJECTIVE:  Feeling much better VITAL SIGNS: Temp:  [97.8 F (36.6 C)-98.5 F (36.9 C)] 98.3 F (36.8 C) (06/13 0851) Pulse Rate:  [30-79] 69 (06/13 0851) Resp:  [16-23] 16 (06/13 0851) BP: (142-161)/(77-98) 161/91 (06/13 0851) SpO2:  [83 %-100 %] 100 % (06/13 0851) Weight:  [164 lb (74.4 kg)-164 lb 10.9 oz (74.7 kg)] 164 lb 10.9 oz (74.7 kg) (06/13 9562)  PHYSICAL EXAMINATION: General: This is a very pleasant 62 year old female who is currently in no acute distress HEENT normocephalic atraumatic no jugular venous distention Pulmonary clear to auscultation accessory use Abdomen: Soft nontender Cardiac: Regular rate and rhythm Extremity: No significant edema Neuro: Awake oriented  Recent Labs  Lab 02/18/18 0315 02/19/18 0308 02/19/18 1217 02/20/18 0303  NA 137 135 136 137  K 4.9 5.6* 4.6 5.4*  CL 99* 98* 98* 103  CO2 25 24  --  25  BUN 97* 110* 57* 80*  CREATININE 7.80* 7.45* 4.50* 5.61*  GLUCOSE 277* 337* 341* 388*   Recent Labs  Lab 02/16/18 0233 02/18/18 0315 02/19/18 0859 02/19/18 1217  HGB 11.0* 10.3* 10.5* 10.9*  HCT 33.2* 31.8* 32.5* 32.0*  WBC 13.7* 8.5 11.9*  --   PLT 283 285 310  --    Ir Fluoro Guide Cv Line Right  Result Date: 02/18/2018 INDICATION: 62 year old female with renal failure in need of hemodialysis EXAM: IR RIGHT FLOURO GUIDE CV LINE; IR ULTRASOUND GUIDANCE VASC ACCESS RIGHT MEDICATIONS:  None ANESTHESIA/SEDATION: None FLUOROSCOPY TIME:  Fluoroscopy Time: 0 minutes 6 seconds (0 mGy). COMPLICATIONS: None immediate. PROCEDURE: Informed written consent was obtained from the patient after a thorough discussion of the procedural risks, benefits and alternatives. All questions were addressed. Maximal Sterile Barrier  Technique was utilized including caps, mask, sterile gowns, sterile gloves, sterile drape, hand hygiene and skin antiseptic. A timeout was performed prior to the initiation of the procedure. The right internal jugular vein was interrogated with ultrasound and found to be widely patent. An image was obtained and stored for the medical record. Local anesthesia was attained by infiltration with 1% lidocaine. A small dermatotomy was made. Under real-time sonographic guidance, the vessel was punctured with a 21 gauge micropuncture needle. Using standard technique, the initial micro needle was exchanged over a 0.018 micro wire for a transitional 4 [place] micro sheath. The micro sheath was then exchanged over a 0.035 wire for a dilator which was used to dilate the soft tissue tract. A 20 cm 3 lumen non tunneled dialysis catheter was then advanced over the wire and position with the tip in the upper right atrium. The catheter flushed and aspirated with ease. The catheter was flushed with heparinized saline and secured to the skin with 0 Prolene suture. The patient tolerated the procedure well. IMPRESSION: Successful placement of a non tunneled Trialysis catheter via the right internal jugular vein. The catheter tip is in the upper right atrium and ready for immediate use. Signed, Criselda Peaches, MD [facility] Radiology Electronically Signed   By: Jacqulynn Cadet M.D.   On: 02/18/2018 16:08   Ir US Guide Vasc Access Right  Result Date: 02/18/2018 INDICATION: 62 year old female with renal failure in need of hemodialysis EXAM: IR RIGHT FLOURO GUIDE CV LINE; IR ULTRASOUND GUIDANCE VASC ACCESS RIGHT MEDICATIONS: None ANESTHESIA/SEDATION: None FLUOROSCOPY TIME:  Fluoroscopy Time: 0 minutes 6 seconds (0 mGy). COMPLICATIONS: None immediate. PROCEDURE: Informed written consent was obtained from the patient after a thorough discussion of the procedural risks, benefits and  alternatives. All questions were addressed. Maximal Sterile Barrier Technique was utilized including caps, mask, sterile gowns, sterile gloves, sterile drape, hand hygiene and skin antiseptic. A timeout was performed prior to the initiation of the procedure. The right internal jugular vein was interrogated with ultrasound and found to be widely patent. An image was obtained and stored for the medical record. Local anesthesia was attained by infiltration with 1% lidocaine. A small dermatotomy was made. Under real-time sonographic guidance, the vessel was punctured with a 21 gauge micropuncture needle. Using standard technique, the initial micro needle was exchanged over a 0.018 micro wire for a transitional 4 [place] micro sheath. The micro sheath was then exchanged over a 0.035 wire for a dilator which was used to dilate the soft tissue tract. A 20 cm 3 lumen non tunneled dialysis catheter was then advanced over the wire and position with the tip in the upper right atrium. The catheter flushed and aspirated with ease. The catheter was flushed with heparinized saline and secured to the skin with 0 Prolene suture. The patient tolerated the procedure well. IMPRESSION: Successful placement of a non tunneled Trialysis catheter via the right internal jugular vein. The catheter tip is in the upper right atrium and ready for immediate use. Signed, Criselda Peaches, MD [facility] Radiology Electronically Signed   By: Jacqulynn Cadet M.D.   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

## 2018-02-21 LAB — RENAL FUNCTION PANEL
Albumin: 3.3 g/dL — ABNORMAL LOW (ref 3.5–5.0)
Anion gap: 8 (ref 5–15)
BUN: 62 mg/dL — ABNORMAL HIGH (ref 6–20)
CALCIUM: 8.1 mg/dL — AB (ref 8.9–10.3)
CHLORIDE: 108 mmol/L (ref 101–111)
CO2: 22 mmol/L (ref 22–32)
Creatinine, Ser: 4.35 mg/dL — ABNORMAL HIGH (ref 0.44–1.00)
GFR calc Af Amer: 11 mL/min — ABNORMAL LOW (ref >60)
GFR calc non Af Amer: 10 mL/min — ABNORMAL LOW (ref >60)
GLUCOSE: 286 mg/dL — AB (ref 65–99)
POTASSIUM: 4.8 mmol/L (ref 3.5–5.1)
Phosphorus: 6.1 mg/dL — ABNORMAL HIGH (ref 2.5–4.6)
SODIUM: 138 mmol/L (ref 135–145)

## 2018-02-21 LAB — PREPARE FRESH FROZEN PLASMA
UNIT DIVISION: 0
Unit division: 0

## 2018-02-21 LAB — GLUCOSE, CAPILLARY
GLUCOSE-CAPILLARY: 345 mg/dL — AB (ref 65–99)
Glucose-Capillary: 271 mg/dL — ABNORMAL HIGH (ref 65–99)
Glucose-Capillary: 400 mg/dL — ABNORMAL HIGH (ref 65–99)
Glucose-Capillary: 274 mg/dL — ABNORMAL HIGH (ref 65–99)

## 2018-02-21 LAB — BPAM FFP
Blood Product Expiration Date: 201906152359
Blood Product Expiration Date: 201906172359
ISSUE DATE / TIME: 201906131651
ISSUE DATE / TIME: 201906131651
UNIT TYPE AND RH: 6200
Unit Type and Rh: 6200

## 2018-02-21 LAB — CBC
HCT: 30.7 % — ABNORMAL LOW (ref 36.0–46.0)
Hemoglobin: 9.8 g/dL — ABNORMAL LOW (ref 12.0–15.0)
MCH: 25.9 pg — AB (ref 26.0–34.0)
MCHC: 31.9 g/dL (ref 30.0–36.0)
MCV: 81.2 fL (ref 78.0–100.0)
Platelets: 179 10*3/uL (ref 150–400)
RBC: 3.78 MIL/uL — ABNORMAL LOW (ref 3.87–5.11)
RDW: 20.1 % — ABNORMAL HIGH (ref 11.5–15.5)
WBC: 12.1 10*3/uL — ABNORMAL HIGH (ref 4.0–10.5)

## 2018-02-21 MED ORDER — LORAZEPAM 2 MG/ML IJ SOLN
INTRAMUSCULAR | Status: AC
  Administered 2018-02-21: 1 mg via INTRAVENOUS
  Filled 2018-02-21: qty 1

## 2018-02-21 MED ORDER — PREDNISONE 20 MG PO TABS
60.0000 mg | ORAL_TABLET | Freq: Every day | ORAL | Status: DC
  Administered 2018-02-22 – 2018-02-28 (×5): 60 mg via ORAL
  Filled 2018-02-21 (×5): qty 3

## 2018-02-21 MED ORDER — CYCLOPHOSPHAMIDE 50 MG PO CAPS
100.0000 mg | ORAL_CAPSULE | Freq: Every day | ORAL | Status: DC
  Administered 2018-02-21 – 2018-03-14 (×19): 100 mg via ORAL
  Filled 2018-02-21 (×25): qty 2

## 2018-02-21 MED ORDER — LORAZEPAM 2 MG/ML IJ SOLN
1.0000 mg | Freq: Once | INTRAMUSCULAR | Status: AC
  Administered 2018-02-21: 1 mg via INTRAVENOUS

## 2018-02-21 MED ORDER — INSULIN GLARGINE 100 UNIT/ML ~~LOC~~ SOLN
15.0000 [IU] | Freq: Every day | SUBCUTANEOUS | Status: DC
  Administered 2018-02-21 – 2018-03-05 (×12): 15 [IU] via SUBCUTANEOUS
  Filled 2018-02-21 (×14): qty 0.15

## 2018-02-21 MED ORDER — INSULIN ASPART 100 UNIT/ML ~~LOC~~ SOLN
0.0000 [IU] | Freq: Three times a day (TID) | SUBCUTANEOUS | Status: DC
  Administered 2018-02-21: 20 [IU] via SUBCUTANEOUS
  Administered 2018-02-21: 11 [IU] via SUBCUTANEOUS
  Administered 2018-02-21: 15 [IU] via SUBCUTANEOUS
  Administered 2018-02-22: 7 [IU] via SUBCUTANEOUS
  Administered 2018-02-23: 3 [IU] via SUBCUTANEOUS
  Administered 2018-02-23 (×2): 4 [IU] via SUBCUTANEOUS
  Administered 2018-02-24: 11 [IU] via SUBCUTANEOUS
  Administered 2018-02-24: 3 [IU] via SUBCUTANEOUS
  Administered 2018-02-25: 20 [IU] via SUBCUTANEOUS
  Administered 2018-02-25: 7 [IU] via SUBCUTANEOUS
  Administered 2018-02-26: 4 [IU] via SUBCUTANEOUS
  Administered 2018-02-27: 7 [IU] via SUBCUTANEOUS
  Administered 2018-02-28 (×2): 11 [IU] via SUBCUTANEOUS

## 2018-02-21 MED ORDER — CHLORHEXIDINE GLUCONATE CLOTH 2 % EX PADS: 6.0000 | MEDICATED_PAD | Freq: Every day | CUTANEOUS | Status: DC

## 2018-02-21 NOTE — Progress Notes (Signed)
PROGRESS NOTE    RAYMONDE HAMBLIN  IRC:[id number] DOB: [birth year] DOA: 02/12/2018 PCP: Patient, No Pcp Per   Brief Narrative:  HPI On 02/12/2018 by Dr. Christia Reading Opyd LANDIS CASSARO is a 62 y.o. female with medical history significant for cocaine and alcohol abuse, having rarely seen a physician in the last decade, now presenting to the emergency department for evaluation abdominal pain.  Patient reports 1 week of stabbing pain in the epigastrium and called EMS.  She reports associated nausea without vomiting, and upon direct questioning acknowledges recent shortness of breath and productive cough for the past 1 to 2 weeks.  She reports only minimal alcohol use though family notes she is a heavy daily drinker.  Patient reports that she has not smoked crack in years.  She denies fevers, chills, or chest pain.  Reports that her urine has become darker recently.  Reports occasional mild headache without change in vision or hearing or focal numbness or weakness.  She denies melena or hematochezia.  Interim history Admitted for possible sepsis secondary to pneumonia complicated by acute hypoxic respiratory failure and acute kidney injury.   Assessment & Plan   Sepsis secondary to Streptococcus Bilateral pneumonia/Multilobar with cryptogenic organizing pneumonia -Sepsis morphology resolved -Chest x-ray on admission showed confluent airspace disease, R > L -Placed on azithromycin and ceftriaxone, course completed -CT chest showed possible findings suggestive of cryptogenic organizing pneumonia -Patient also found to have vasculitis, suspect this is also affecting her pulmonary system -Pulmonology consulted and appreciated: Recommended steroids, supplemental oxygen, 7 days of Rocephin for strep positive urine antigen -Pulm recommendations: Continue Solu-Medrol, 250 mg IV every 6 through 02/20/2018.  On 02/21/2018, will change to prednisone 40 mg daily -Patient will also need a pulmonology outpatient  follow-up -patient started prednisone 60mg  daily by nephrology   Acute hypoxic respiratory failure -Secondary to the above versus vasculitis -Treatment and plan as above -Supplemental oxygen, will try to wean if possible -Will monitor oxygen saturations with ambulation to determine home requirements  Acute kidney injury with hyperkalemia amd /rapid progressive glomerulonephritis/MPO ANCA vasculitis -Nephrology consulted and appreciated -Creatinine on admission 7.58 -Status post kidney biopsy on 02/17/2018 -Started hemodialysis  -Creatinine currently 4.35 -Cytoxan as well as steroids -Noted on Bactrim on Monday Wednesday Friday -continue to monitor BMP  Hyperglycemia -No history of diabetes mellitus -Secondary to high-dose steroids.  Have been discontinued, currently on prednisone -Added on Lantus, continue insulin sliding scale with CBG monitoring -As steroids are weaned, patient will not need insulin.  Non-anion gap metabolic acidosis -resolved   Anemia of chronic disease/anemia of iron deficiency -Hemoglobin as low as 6.7 -Received 2 units PRBC IV iron -Hemoglobin currently 9.8 -Continue to monitor CBC  Alcohol and cocaine abuse -Counseled -Currently not having withdrawal symptoms  Alcohol induced gastritis -Continue famotidine  Physical deconditioning -PT evaluated patient and recommended home health PT, 24-hour supervision. -Case management consulted  DVT Prophylaxis  SCDs  Code Status: Full  Family Communication: None at bedside  Disposition Plan: Admitted. Dispo pending.  Consultants Pulmonology Nephrology  Procedures  Renal ultrasound Ultrasound-guided core biopsy of left kidney  Antibiotics   Anti-infectives (From admission, onward)   Start     Dose/Rate Route Frequency Ordered Stop   02/21/18 0900  sulfamethoxazole-trimethoprim (BACTRIM DS,SEPTRA DS) 800-160 MG per tablet 1 tablet     1 tablet Oral Once per day on Mon Wed Fri 02/20/18 1116      02/13/18 1900  cefTRIAXone (ROCEPHIN) 1 g in sodium chloride 0.9 % 100  mL IVPB     1 g 200 mL/hr over 30 Minutes Intravenous Every 24 hours 02/12/18 2122 02/18/18 2014   02/13/18 1900  azithromycin (ZITHROMAX) 500 mg in sodium chloride 0.9 % 250 mL IVPB  Status:  Discontinued     500 mg 250 mL/hr over 60 Minutes Intravenous Every 24 hours 02/12/18 2122 02/14/18 0819   02/12/18 1915  cefTRIAXone (ROCEPHIN) 2 g in sodium chloride 0.9 % 100 mL IVPB  Status:  Discontinued     2 g 200 mL/hr over 30 Minutes Intravenous Every 24 hours 02/12/18 1901 02/12/18 2125   02/12/18 1915  azithromycin (ZITHROMAX) 500 mg in sodium chloride 0.9 % 250 mL IVPB  Status:  Discontinued     500 mg 250 mL/hr over 60 Minutes Intravenous Every 24 hours 02/12/18 1901 02/12/18 2125      Subjective:   Philemon Kingdom seen and examined today.  She is feeling better today.  Denies current chest pain, shortness of breath, abdominal pain, nausea or vomiting, diarrhea or constipation.  Feels tired.  Objective:   Vitals:   02/20/18 1723 02/20/18 2356 02/21/18 0500 02/21/18 0815  BP: 131/65 (!) 163/70  (!) 157/75  Pulse: 73 63  (!) 57  Resp: 18   17  Temp: 98.4 F (36.9 C) 98.8 F (37.1 C)  98.4 F (36.9 C)  TempSrc: Oral Oral  Oral  SpO2: 99% 97%  98%  Weight:   74.2 kg (163 lb 9.3 oz)   Height:        Intake/Output Summary (Last 24 hours) at 02/21/2018 1524 Last data filed at 02/21/2018 1100 Gross per 24 hour  Intake 360 ml  Output -  Net 360 ml   Filed Weights   02/20/18 0639 02/20/18 1210 02/21/18 0500  Weight: 74.7 kg (164 lb 10.9 oz) 75.7 kg (166 lb 14.2 oz) 74.2 kg (163 lb 9.3 oz)   Exam  General: Well developed, well nourished, NAD, appears stated age  HEENT: NCAT, mucous membranes moist.   Neck: Supple  Cardiovascular: S1 S2 auscultated, no rubs, murmurs or gallops. Regular rate and rhythm.  Respiratory: Clear to auscultation bilaterally with equal chest rise  Abdomen: Soft, nontender,  nondistended, + bowel sounds  Extremities: warm dry without cyanosis clubbing or edema  Neuro: AAOx3, nonfocal  Skin: Without rashes exudates or nodules  Psych: does not, appropriate mood and affect  Data Reviewed: I have personally reviewed following labs and imaging studies  CBC: Recent Labs  Lab 02/15/18 0342 02/16/18 0233 02/18/18 0315 02/19/18 0859 02/19/18 1217 02/20/18 1603 02/21/18 0335  WBC 14.2* 13.7* 8.5 11.9*  --   --  12.1*  NEUTROABS 12.6* 12.0* 7.6  --   --   --   --   HGB 6.8* 11.0* 10.3* 10.5* 10.9* 10.5* 9.8*  HCT 21.0* 33.2* 31.8* 32.5* 32.0* 31.0* 30.7*  MCV 74.7* 79.0 79.7 79.9  --   --  81.2  PLT 274 283 285 310  --   --  841   Basic Metabolic Panel: Recent Labs  Lab 02/17/18 0232 02/18/18 0315 02/19/18 0308 02/19/18 1217 02/20/18 0303 02/20/18 1603 02/21/18 0335  NA 138 137 135 136 137 137 138  K 4.8 4.9 5.6* 4.6 5.4* 4.2 4.8  CL 100* 99* 98* 98* 103 100* 108  CO2 27 25 24   --  25  --  22  GLUCOSE 177* 277* 337* 341* 388* 368* 286*  BUN 86* 97* 110* 57* 80* 40* 62*  CREATININE 7.75* 7.80* 7.45*  4.50* 5.61* 3.10* 4.35*  CALCIUM 7.3* 7.6* 7.7*  --  8.2*  --  8.1*  PHOS 5.5*  --   --   --   --   --  6.1*   GFR: Estimated Creatinine Clearance: 12 mL/min (A) (by C-G formula based on SCr of 4.35 mg/dL (H)). Liver Function Tests: Recent Labs  Lab 02/17/18 0232 02/21/18 0335  ALBUMIN 2.0* 3.3*   No results for input(s): LIPASE, AMYLASE in the last 168 hours. No results for input(s): AMMONIA in the last 168 hours. Coagulation Profile: No results for input(s): INR, PROTIME in the last 168 hours. Cardiac Enzymes: No results for input(s): CKTOTAL, CKMB, CKMBINDEX, TROPONINI in the last 168 hours. BNP (last 3 results) No results for input(s): PROBNP in the last 8760 hours. HbA1C: No results for input(s): HGBA1C in the last 72 hours. CBG: Recent Labs  Lab 02/20/18 0852 02/20/18 1848 02/20/18 2202 02/21/18 0752 02/21/18 1149  GLUCAP  305* 429* 379* 271* 400*   Lipid Profile: No results for input(s): CHOL, HDL, LDLCALC, TRIG, CHOLHDL, LDLDIRECT in the last 72 hours. Thyroid Function Tests: No results for input(s): TSH, T4TOTAL, FREET4, T3FREE, THYROIDAB in the last 72 hours. Anemia Panel: No results for input(s): VITAMINB12, FOLATE, FERRITIN, TIBC, IRON, RETICCTPCT in the last 72 hours. Urine analysis:    Component Value Date/Time   COLORURINE YELLOW 02/12/2018 1801   APPEARANCEUR HAZY (A) 02/12/2018 1801   LABSPEC 1.013 02/12/2018 1801   PHURINE 5.0 02/12/2018 1801   GLUCOSEU NEGATIVE 02/12/2018 1801   HGBUR LARGE (A) 02/12/2018 1801   HGBUR trace-intact 03/08/2010 1452   BILIRUBINUR NEGATIVE 02/12/2018 1801   KETONESUR NEGATIVE 02/12/2018 1801   PROTEINUR 100 (A) 02/12/2018 1801   UROBILINOGEN 0.2 03/08/2010 1452   NITRITE NEGATIVE 02/12/2018 1801   LEUKOCYTESUR NEGATIVE 02/12/2018 1801   Sepsis Labs: @LABRCNTIP (procalcitonin:4,lacticidven:4)  ) Recent Results (from the past 240 hour(s))  Culture, blood (routine x 2)     Status: None   Collection Time: 02/12/18  7:50 PM  Result Value Ref Range Status   Specimen Description BLOOD RIGHT ANTECUBITAL  Final   Special Requests   Final    BOTTLES DRAWN AEROBIC AND ANAEROBIC Blood Culture adequate volume   Culture   Final    NO GROWTH 5 DAYS Performed at [facility], [place] [address], [place], [place] [address]    Report Status 02/17/2018 FINAL  Final  Culture, blood (routine x 2)     Status: None   Collection Time: 02/12/18  8:07 PM  Result Value Ref Range Status   Specimen Description BLOOD LEFT ANTECUBITAL  Final   Special Requests   Final    BOTTLES DRAWN AEROBIC AND ANAEROBIC Blood Culture adequate volume   Culture   Final    NO GROWTH 5 DAYS Performed at [facility], [place] [address] [place], [place] [address]    Report Status 02/17/2018 FINAL  Final  Respiratory Panel by PCR     Status: None   Collection Time: 02/13/18  8:48 AM    Result Value Ref Range Status   Adenovirus NOT DETECTED NOT DETECTED Final   Coronavirus 229E NOT DETECTED NOT DETECTED Final   Coronavirus HKU1 NOT DETECTED NOT DETECTED Final   Coronavirus NL63 NOT DETECTED NOT DETECTED Final   Coronavirus OC43 NOT DETECTED NOT DETECTED Final   Metapneumovirus NOT DETECTED NOT DETECTED Final   Rhinovirus / Enterovirus NOT DETECTED NOT DETECTED Final   Influenza A NOT DETECTED NOT DETECTED  Final   Influenza B NOT DETECTED NOT DETECTED Final   Parainfluenza Virus 1 NOT DETECTED NOT DETECTED Final   Parainfluenza Virus 2 NOT DETECTED NOT DETECTED Final   Parainfluenza Virus 3 NOT DETECTED NOT DETECTED Final   Parainfluenza Virus 4 NOT DETECTED NOT DETECTED Final   Respiratory Syncytial Virus NOT DETECTED NOT DETECTED Final   Bordetella pertussis NOT DETECTED NOT DETECTED Final   Chlamydophila pneumoniae NOT DETECTED NOT DETECTED Final   Mycoplasma pneumoniae NOT DETECTED NOT DETECTED Final  MRSA PCR Screening     Status: None   Collection Time: 02/13/18  1:55 PM  Result Value Ref Range Status   MRSA by PCR NEGATIVE NEGATIVE Final    Comment:        The GeneXpert MRSA Assay (FDA approved for NASAL specimens only), is one component of a comprehensive MRSA colonization surveillance program. It is not intended to diagnose MRSA infection nor to guide or monitor treatment for MRSA infections. Performed at [facility], [place] [address], [place],  [address]       Radiology Studies: No results found.   Scheduled Meds: . Chlorhexidine Gluconate Cloth  6 each Topical Q0600  . cyclophosphamide  100 mg Oral Daily  . famotidine  20 mg Oral Daily  . insulin aspart  0-20 Units Subcutaneous TID WC  . insulin aspart  0-5 Units Subcutaneous QHS  . insulin aspart  3 Units Subcutaneous TID WC  . insulin glargine  15 Units Subcutaneous Daily  . multivitamin with minerals  1 tablet Oral Daily  . [START ON 02/22/2018] predniSONE  60 mg Oral Q  breakfast  . sodium chloride flush  3 mL Intravenous Q12H  . sulfamethoxazole-trimethoprim  1 tablet Oral Once per day on Mon Wed Fri  . thiamine  100 mg Oral Daily   Continuous Infusions: . sodium chloride    . sodium chloride    . sodium chloride       LOS: 9 days   Time Spent in minutes   45 minutes   Zenora Karpel D.O. on 02/21/2018 at 3:24 PM  Between 7am to 7pm - Pager - [phone]  After 7pm go to [email or web address] - password [email or web address]  And look for the night coverage person covering for me after hours  [facility] Office  [phone]

## 2018-02-21 NOTE — Care Management Note (Signed)
Case Management Note  Patient Details  Name: BRELAND ELDERS MRN: [id number] Date of Birth: Apr 21, 1956  Subjective/Objective:                Patient currently getting inpatient HD, nephrology following kidney function over the weekend to determine if chronic HD needed. Watch for [facility] needs.     Action/Plan:   Expected Discharge Date:  02/17/18               Expected Discharge Plan:  Home/Self Care  In-House Referral:     Discharge planning Services  CM Consult  Post Acute Care Choice:    Choice offered to:     DME Arranged:    DME Agency:     HH Arranged:    HH Agency:     Status of Service:  In process, will continue to follow  If discussed at Long Length of Stay Meetings, dates discussed:    Additional Comments:  Carles Collet, RN 02/21/2018, 3:29 PM

## 2018-02-21 NOTE — Progress Notes (Signed)
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 Chlorhexidine Gluconate Cloth  6 each Topical Q0600  . Chlorhexidine Gluconate Cloth  6 each Topical Q0600  . cyclophosphamide  100 mg Oral Daily  . famotidine  20 mg Oral Daily  . insulin aspart  0-20 Units Subcutaneous TID WC  . insulin aspart  0-5 Units Subcutaneous QHS  . insulin aspart  3 Units Subcutaneous TID WC  . insulin glargine  15 Units Subcutaneous Daily  . multivitamin with minerals  1 tablet Oral Daily  . predniSONE  40 mg Oral Q breakfast  . sodium chloride flush  3 mL Intravenous Q12H  . sulfamethoxazole-trimethoprim  1 tablet Oral Once per day on Mon Wed Fri  . thiamine  100 mg Oral Daily   Continuous Infusions: . sodium chloride    . sodium chloride    . 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 The appearance of the lungs is nonspecific, but there are imaging findings that are suggestive of cryptogenic organizing pneumonia (COP). Other differential considerations include both cardiogenic and noncardiogenic edema, however, this is not strongly favored. 2. Aortic atherosclerosis.   Ref. Range 02/14/2018 11:15  Myeloperoxidase Abs Latest Ref Range: 0.0 - 9.0 U/mL 44.7 (H)    Ref. Range 02/14/2018  11:15  ANCA Proteinase 3 Latest Ref Range: 0.0 - 3.5 U/mL <3.5    Ref. Range 02/12/2018 23:44  Anti Nuclear Antibody(ANA) Latest Ref Range: Negative  Positive (A)    Ref. Range 02/12/2018 23:44  HIV Screen 4th Generation wRfx Latest Ref Range: Non Reactive  Non Reactive    Ref. Range 02/17/2018 01:50  Protein Creatinine Ratio Latest Ref Range: 0.00 - 0.15 mg/mgCre 2.52 (H)   Results for Rhonda Lynch, Rhonda Lynch (MRN [id number]) as of 02/20/2018 11:13  Ref. 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 Increased echogenicity. No mass or hydronephrosis visualized. Left Kidney: Length: 10.9 cm. Increased echogenicity. No mass or hydronephrosis Visualized. Bladder: Decompressed by a Foley catheter. IMPRESSION: Increased renal cortical echogenicity bilaterally, consistent with medical renal disease  Physical Exam:  Blood pressure (!) 157/75, pulse (!) 57, temperature 98.4 F (36.9 C), temperature source Oral, resp. rate 17, height 5\' 1"  (1.549 m), weight 74.2 kg (163 lb 9.3 oz), SpO2 98 %. NAD, lying prone RRR CTAB No LEE No rashes/noted R IJ Temp HD cath present  A 1. 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 Started TPE, 1PV x7, Albumin on 6/12, 6/13 2. Started HD on  6/12, 6/13 3. Solumedrol Pulse started 6/11-6/13, now pred 60mg /d 4. Started cyclophosphamide 100mg /d 02/18/18, monitor for leukopenia 5. TMP/SMX DS for PCP prevention start 02/20/18 2. Cocaine use 6/5 UDS positive for cocaine 3. S pneumoniae PNA on ceftriaxone; also on steroids 4. 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 NSAID use prior to admission  P 1. TPE #3 tomorrow 2. HD #3 tomorrow, then monitor over weekend for renal recovery: 3h, 2K, 2.5 Ca, 1-2L UF, No  heparin, Qb 350 3. Cont pred/cytoxoan 4. Daily weights, Daily Renal Panel, Strict I/Os, Avoid nephrotoxins (NSAIDs, judicious IV Contrast)   Pearson Grippe MD 02/21/2018, 11:53 AM  Recent Labs  Lab 02/17/18 0232  02/19/18 0308  02/20/18 0303 02/20/18 1603 02/21/18 0335  NA 138   < > 135   < > 137 137 138  K 4.8   < > 5.6*   < > 5.4* 4.2 4.8  CL 100*   < > 98*   < > 103 100* 108  CO2 27   < > 24  --  25  --  22  GLUCOSE 177*   < > 337*   < > 388* 368* 286*  BUN 86*   < > 110*   < > 80* 40* 62*  CREATININE 7.75*   < > 7.45*   < > 5.61* 3.10* 4.35*  CALCIUM 7.3*   < > 7.7*  --  8.2*  --  8.1*  PHOS 5.5*  --   --   --   --   --  6.1*   < > = values in this interval not displayed.   Recent Labs  Lab 02/15/18 0342 02/16/18 0233 02/18/18 0315 02/19/18 0859 02/19/18 1217 02/20/18 1603 02/21/18 0335  WBC 14.2* 13.7* 8.5 11.9*  --   --  12.1*  NEUTROABS 12.6* 12.0* 7.6  --   --   --   --   HGB 6.8* 11.0* 10.3* 10.5* 10.9* 10.5* 9.8*  HCT 21.0* 33.2* 31.8* 32.5* 32.0* 31.0* 30.7*  MCV 74.7* 79.0 79.7 79.9  --   --  81.2  PLT 274 283 285 310  --   --  179

## 2018-02-21 NOTE — Progress Notes (Signed)
Physical Therapy Treatment Patient Details Name: Rhonda Lynch MRN: [id number] DOB: Jan 29, 1956 Today's Date: 02/21/2018    History of Present Illness Pt is a 62 yo female presenting with epigastric pain and was tachypneic. PMH: cocaine and alcohol abuse. Pt found to have PNA, microctic anemia, renal failurs and HTN urgency.    PT Comments    Pt making good progress towards goals today, however limited in safe mobility by oxygen desaturation without supplemental O2 (see General Comments). Pt gait today much more stable and she was able to participate in higher level balance activities in walking. D/c plans remain appropriate to progress balance training. PT will continue to follow acutely.   Follow Up Recommendations  Supervision/Assistance - 24 hour;Home health PT     Equipment Recommendations  None recommended by PT    Recommendations for Other Services       Precautions / Restrictions Precautions Precautions: Fall Precaution Comments: watch O2 Restrictions Weight Bearing Restrictions: No    Mobility  Bed Mobility Overal bed mobility: Needs Assistance Bed Mobility: Supine to Sit     Supine to sit: HOB elevated;Modified independent (Device/Increase time)        Transfers Overall transfer level: Needs assistance Equipment used: None Transfers: Sit to/from Stand Sit to Stand: Supervision         General transfer comment: supervision for safety  Ambulation/Gait Ambulation/Gait assistance: Min guard Gait Distance (Feet): 600 Feet Assistive device: None Gait Pattern/deviations: Step-through pattern;Decreased stride length;Trunk flexed;Narrow base of support Gait velocity: slow Gait velocity interpretation: 1.31 - 2.62 ft/sec, indicative of limited community ambulator General Gait Details: min guard with ambulation, pt with improved stability today          Balance Overall balance assessment: Needs assistance Sitting-balance support: Feet supported;No  upper extremity supported Sitting balance-Leahy Scale: Good Sitting balance - Comments: pt able to don socks at EOB without difficulty   Standing balance support: No upper extremity supported;During functional activity Standing balance-Leahy Scale: Fair Standing balance comment: able to static stand without support              High level balance activites: Direction changes;Turns;Sudden stops;Head turns High Level Balance Comments: practiced high level balance activities with ambulation and pt exhibilit only minor instability             Cognition Arousal/Alertness: Awake/alert Behavior During Therapy: WFL for tasks assessed/performed Overall Cognitive Status: Within Functional Limits for tasks assessed                                           General Comments General comments (skin integrity, edema, etc.): pt on 2L O2 via nasal cannula at entry with SaO2 96%O2, pt request removal of supplementary O2 but SaO2 dropped to 83%O2 on RA, with ambulation on 2L O2 SaO2 dropped to 83%O2 pt instructed to breathe through her nose when she get SoB with nasal inhale pt able to maintain SaO2 at 95%O2 during ambulation      Pertinent Vitals/Pain Pain Assessment: No/denies pain           PT Goals (current goals can now be found in the care plan section) Acute Rehab PT Goals Patient Stated Goal: get better PT Goal Formulation: With patient Time For Goal Achievement: 03/01/18 Potential to Achieve Goals: Good Progress towards PT goals: Progressing toward goals    Frequency    Min 3X/week  PT Plan Current plan remains appropriate       AM-PAC PT "6 Clicks" Daily Activity  Outcome Measure  Difficulty turning over in bed (including adjusting bedclothes, sheets and blankets)?: None Difficulty moving from lying on back to sitting on the side of the bed? : None Difficulty sitting down on and standing up from a chair with arms (e.g., wheelchair, bedside  commode, etc,.)?: None Help needed moving to and from a bed to chair (including a wheelchair)?: None Help needed walking in hospital room?: A Little Help needed climbing 3-5 steps with a railing? : A Little 6 Click Score: 22    End of Session Equipment Utilized During Treatment: Gait belt Activity Tolerance: Patient tolerated treatment well Patient left: in chair;with call bell/phone within reach;with chair alarm set Nurse Communication: Mobility status(SpO2 at 82% on 8LO2 with mobility) PT Visit Diagnosis: Unsteadiness on feet (R26.81);Difficulty in walking, not elsewhere classified (R26.2)     Time: 4696-2952 PT Time Calculation (min) (ACUTE ONLY): 17 min  Charges:  $Gait Training: 8-22 mins                    G Codes:       Stormey Wilborn B. Migdalia Dk PT, DPT Acute Rehabilitation  [phone] Pager [phone]     [place] 02/21/2018, 6:00 PM

## 2018-02-21 NOTE — Progress Notes (Signed)
Inpatient Diabetes Program Recommendations  AACE/ADA: New Consensus Statement on Inpatient Glycemic Control (2015)  Target Ranges:  Prepandial:   less than 140 mg/dL      Peak postprandial:   less than 180 mg/dL (1-2 hours)      Critically ill patients:  140 - 180 mg/dL   Results for Rhonda Lynch, Rhonda Lynch (MRN [id number]) as of 02/21/2018 11:52  Ref. Range 02/20/2018 08:52 02/20/2018 18:48 02/20/2018 22:02  Glucose-Capillary Latest Ref Range: 65 - 99 mg/dL 305 (H) 429 (H)  13 units NOVOLOG  379 (H)  5 units NOVOLOG     Results for Rhonda Lynch, Rhonda Lynch (MRN [id number]) as of 02/21/2018 11:52  Ref. Range 02/21/2018 07:52 02/21/2018 11:49  Glucose-Capillary Latest Ref Range: 65 - 99 mg/dL 271 (H)  14 units NOVOLOG +  15 units LANTUS at 9am  400 (H)  23 units NOVOLOG     Admit with: Abd Pain/ Sepsis/ Resp Failure  History: Crack Cocaine Abuse, ETOH Abuse  NO History of DM  Current Orders: Lantus 15 units daily      Novolog Resistant Correction Scale/ SSI (0-20 units) TID AC + HS      Novolog 3 units TID with meals     Note patient was getting Solumedrol 250 mg Q6 hours.  Last dose given at [facility] last night.  Now getting Prednisone 60 mg daily.  Patient receiving Hemodialysis and TPE per Nephrology Team.  Note Lantus started this AM and Novolog SSI increased to the Resistant scale as well.  No recommendations today since Solumedrol stopped and Lantus initiated this AM.  Not sure if patient will need Insulin at time of discharge??    --Will follow patient during hospitalization--  Wyn Quaker RN, MSN, CDE Diabetes Coordinator Inpatient Glycemic Control Team Team Pager: [phone] (8a-5p)

## 2018-02-22 LAB — BASIC METABOLIC PANEL
ANION GAP: 9 (ref 5–15)
BUN: 90 mg/dL — ABNORMAL HIGH (ref 6–20)
CALCIUM: 8.2 mg/dL — AB (ref 8.9–10.3)
CO2: 23 mmol/L (ref 22–32)
CREATININE: 5.45 mg/dL — AB (ref 0.44–1.00)
Chloride: 105 mmol/L (ref 101–111)
GFR, EST AFRICAN AMERICAN: 9 mL/min — AB (ref >60)
GFR, EST NON AFRICAN AMERICAN: 7 mL/min — AB (ref >60)
Glucose, Bld: 125 mg/dL — ABNORMAL HIGH (ref 65–99)
Potassium: 4.6 mmol/L (ref 3.5–5.1)
Sodium: 137 mmol/L (ref 135–145)

## 2018-02-22 LAB — CBC
HEMATOCRIT: 29.9 % — AB (ref 36.0–46.0)
HEMOGLOBIN: 9.8 g/dL — AB (ref 12.0–15.0)
MCH: 25.9 pg — ABNORMAL LOW (ref 26.0–34.0)
MCHC: 32.8 g/dL (ref 30.0–36.0)
MCV: 79.1 fL (ref 78.0–100.0)
Platelets: 142 10*3/uL — ABNORMAL LOW (ref 150–400)
RBC: 3.78 MIL/uL — ABNORMAL LOW (ref 3.87–5.11)
RDW: 19.6 % — AB (ref 11.5–15.5)
WBC: 17.9 10*3/uL — AB (ref 4.0–10.5)

## 2018-02-22 LAB — GLUCOSE, CAPILLARY
GLUCOSE-CAPILLARY: 104 mg/dL — AB (ref 65–99)
GLUCOSE-CAPILLARY: 175 mg/dL — AB (ref 65–99)
Glucose-Capillary: 229 mg/dL — ABNORMAL HIGH (ref 65–99)

## 2018-02-22 MED ORDER — ACETAMINOPHEN 325 MG PO TABS
650.0000 mg | ORAL_TABLET | ORAL | Status: DC | PRN
  Administered 2018-02-22: 650 mg via ORAL

## 2018-02-22 MED ORDER — CALCIUM CARBONATE ANTACID 500 MG PO CHEW
CHEWABLE_TABLET | ORAL | Status: AC
  Administered 2018-02-22: 400 mg via ORAL
  Filled 2018-02-22: qty 2

## 2018-02-22 MED ORDER — HEPARIN SODIUM (PORCINE) 1000 UNIT/ML IJ SOLN
1000.0000 [IU] | Freq: Once | INTRAMUSCULAR | Status: DC
  Filled 2018-02-22: qty 1

## 2018-02-22 MED ORDER — CALCIUM CARBONATE ANTACID 500 MG PO CHEW
2.0000 | CHEWABLE_TABLET | ORAL | Status: AC
  Administered 2018-02-22 (×2): 400 mg via ORAL
  Filled 2018-02-22: qty 2

## 2018-02-22 MED ORDER — DIPHENHYDRAMINE HCL 25 MG PO CAPS: 25.0000 mg | ORAL_CAPSULE | Freq: Four times a day (QID) | ORAL | Status: DC | PRN

## 2018-02-22 MED ORDER — ACD FORMULA A 0.73-2.45-2.2 GM/100ML VI SOLN
Status: AC
  Filled 2018-02-22: qty 500

## 2018-02-22 MED ORDER — SODIUM CHLORIDE 0.9 % IV SOLN
INTRAVENOUS | Status: AC
  Administered 2018-02-22 (×3): via INTRAVENOUS_CENTRAL
  Filled 2018-02-22 (×3): qty 200

## 2018-02-22 MED ORDER — ACD FORMULA A 0.73-2.45-2.2 GM/100ML VI SOLN
500.0000 mL | Status: DC
  Filled 2018-02-22: qty 500

## 2018-02-22 MED ORDER — SODIUM CHLORIDE 0.9 % IV SOLN
2.0000 g | Freq: Once | INTRAVENOUS | Status: AC
  Administered 2018-02-22: 2 g via INTRAVENOUS
  Filled 2018-02-22: qty 20

## 2018-02-22 MED ORDER — ACETAMINOPHEN 325 MG PO TABS
ORAL_TABLET | ORAL | Status: AC
  Administered 2018-02-22: 650 mg via ORAL
  Filled 2018-02-22: qty 2

## 2018-02-22 NOTE — Progress Notes (Signed)
PROGRESS NOTE    TISHIE ALTMANN  LOV:[id number] DOB: [birth year] DOA: 02/12/2018 PCP: Patient, No Pcp Per   Brief Narrative:  HPI On 02/12/2018 by Dr. Christia Reading Opyd KARRIN EISENMENGER is a 62 y.o. female with medical history significant for cocaine and alcohol abuse, having rarely seen a physician in the last decade, now presenting to the emergency department for evaluation abdominal pain.  Patient reports 1 week of stabbing pain in the epigastrium and called EMS.  She reports associated nausea without vomiting, and upon direct questioning acknowledges recent shortness of breath and productive cough for the past 1 to 2 weeks.  She reports only minimal alcohol use though family notes she is a heavy daily drinker.  Patient reports that she has not smoked crack in years.  She denies fevers, chills, or chest pain.  Reports that her urine has become darker recently.  Reports occasional mild headache without change in vision or hearing or focal numbness or weakness.  She denies melena or hematochezia.  Interim history Admitted for possible sepsis secondary to pneumonia complicated by acute hypoxic respiratory failure and acute kidney injury.   Assessment & Plan   Sepsis secondary to Streptococcus Bilateral pneumonia/Multilobar with cryptogenic organizing pneumonia -Sepsis morphology resolved -Chest x-ray on admission showed confluent airspace disease, R > L -Placed on azithromycin and ceftriaxone, course completed -CT chest showed possible findings suggestive of cryptogenic organizing pneumonia -Patient also found to have vasculitis, suspect this is also affecting her pulmonary system -Pulmonology consulted and appreciated: Recommended steroids, supplemental oxygen, 7 days of Rocephin for strep positive urine antigen -Pulm recommendations: Continue Solu-Medrol, 250 mg IV every 6 through 02/20/2018.  On 02/21/2018, will change to prednisone 40 mg daily -Patient will also need a pulmonology outpatient  follow-up -Nephrology started prednisone 60mg  daily  Acute hypoxic respiratory failure -Secondary to the above versus vasculitis -Treatment and plan as above -currently maintaining O2 saturations at 100% on room air  Acute kidney injury with hyperkalemia amd /rapid progressive glomerulonephritis/MPO ANCA vasculitis -Nephrology consulted and appreciated -Creatinine on admission 7.58 -Status post kidney biopsy on 02/17/2018 -Started hemodialysis  -Creatinine currently 5.45 -Cytoxan as well as steroids -Noted on Bactrim on Monday Wednesday Friday -continue to monitor BMP -Discussed with Dr. Joelyn Oms, felt patient would need to be on prednisone 60mg  for at least the next month and will have a prolonged hospital course and will need tunneled cath  Hyperglycemia -No history of diabetes mellitus -Secondary to steroids -Continue lantus, ISS and CBG monitoring  Non-anion gap metabolic acidosis -resolved   Anemia of chronic disease/anemia of iron deficiency -Hemoglobin as low as 6.7 -Received 2 units PRBC IV iron -Hemoglobin currently 9.8 -Continue to monitor CBC  Alcohol and cocaine abuse -Counseled -Currently not having withdrawal symptoms  Alcohol induced gastritis -Continue famotidine  Physical deconditioning -PT evaluated patient and recommended home health PT, 24-hour supervision. -Case management consulted  Acute metabolic encephalopathy -overnight, patient became confused and agitated -likely secondary to steroids -given one dose of ativan -today appears to be AAOx3 -continue to monitor closely   DVT Prophylaxis  SCDs  Code Status: Full  Family Communication: None at bedside  Disposition Plan: Admitted. Dispo pending.  Consultants Pulmonology Nephrology  Procedures  Renal ultrasound Ultrasound-guided core biopsy of left kidney  Antibiotics   Anti-infectives (From admission, onward)   Start     Dose/Rate Route Frequency Ordered Stop   02/21/18 0900   sulfamethoxazole-trimethoprim (BACTRIM DS,SEPTRA DS) 800-160 MG per tablet 1 tablet     1 tablet  Oral Once per day on Mon Wed Fri 02/20/18 1116     02/13/18 1900  cefTRIAXone (ROCEPHIN) 1 g in sodium chloride 0.9 % 100 mL IVPB     1 g 200 mL/hr over 30 Minutes Intravenous Every 24 hours 02/12/18 2122 02/18/18 2014   02/13/18 1900  azithromycin (ZITHROMAX) 500 mg in sodium chloride 0.9 % 250 mL IVPB  Status:  Discontinued     500 mg 250 mL/hr over 60 Minutes Intravenous Every 24 hours 02/12/18 2122 02/14/18 0819   02/12/18 1915  cefTRIAXone (ROCEPHIN) 2 g in sodium chloride 0.9 % 100 mL IVPB  Status:  Discontinued     2 g 200 mL/hr over 30 Minutes Intravenous Every 24 hours 02/12/18 1901 02/12/18 2125   02/12/18 1915  azithromycin (ZITHROMAX) 500 mg in sodium chloride 0.9 % 250 mL IVPB  Status:  Discontinued     500 mg 250 mL/hr over 60 Minutes Intravenous Every 24 hours 02/12/18 1901 02/12/18 2125      Subjective:   [place] seen and examined today.  Feeling sleepy today. Denies current chest pain, shortness of breath, abdominal pain, N/V/D/C.   Objective:   Vitals:   02/21/18 2236 02/22/18 0433 02/22/18 0757 02/22/18 0800  BP: (!) 155/76 (!) 164/79 (!) 148/71 (!) 148/71  Pulse: 65 62  63  Resp: 19 18  16   Temp: 97.8 F (36.6 C) 97.8 F (36.6 C) 98.4 F (36.9 C) 98.4 F (36.9 C)  TempSrc: Oral Oral Oral Oral  SpO2: 92% 98%  100%  Weight:  77.4 kg (170 lb 10.2 oz)    Height:        Intake/Output Summary (Last 24 hours) at 02/22/2018 1152 Last data filed at 02/22/2018 1048 Gross per 24 hour  Intake 123 ml  Output -  Net 123 ml   Filed Weights   02/20/18 1210 02/21/18 0500 02/22/18 0433  Weight: 75.7 kg (166 lb 14.2 oz) 74.2 kg (163 lb 9.3 oz) 77.4 kg (170 lb 10.2 oz)   Exam  General: Well developed, well nourished, NAD, appears stated age  HEENT: NCAT, mucous membranes moist.   Neck: Supple  Cardiovascular: S1 S2 auscultated, RRR, no murmur  Respiratory:  Diminished breath sounds, nonproductive cough  Abdomen: Soft, nontender, nondistended, + bowel sounds  Extremities: warm dry without cyanosis clubbing or edema  Neuro: AAOx3, nonfocal  Psych:Appropriate mood and affect  Data Reviewed: I have personally reviewed following labs and imaging studies  CBC: Recent Labs  Lab 02/16/18 0233 02/18/18 0315 02/19/18 0859 02/19/18 1217 02/20/18 1603 02/21/18 0335 02/22/18 0400  WBC 13.7* 8.5 11.9*  --   --  12.1* 17.9*  NEUTROABS 12.0* 7.6  --   --   --   --   --   HGB 11.0* 10.3* 10.5* 10.9* 10.5* 9.8* 9.8*  HCT 33.2* 31.8* 32.5* 32.0* 31.0* 30.7* 29.9*  MCV 79.0 79.7 79.9  --   --  81.2 79.1  PLT 283 285 310  --   --  179 449*   Basic Metabolic Panel: Recent Labs  Lab 02/17/18 0232 02/18/18 0315 02/19/18 0308 02/19/18 1217 02/20/18 0303 02/20/18 1603 02/21/18 0335 02/22/18 0400  NA 138 137 135 136 137 137 138 137  K 4.8 4.9 5.6* 4.6 5.4* 4.2 4.8 4.6  CL 100* 99* 98* 98* 103 100* 108 105  CO2 27 25 24   --  25  --  22 23  GLUCOSE 177* 277* 337* 341* 388* 368* 286* 125*  BUN 86*  97* 110* 57* 80* 40* 62* 90*  CREATININE 7.75* 7.80* 7.45* 4.50* 5.61* 3.10* 4.35* 5.45*  CALCIUM 7.3* 7.6* 7.7*  --  8.2*  --  8.1* 8.2*  PHOS 5.5*  --   --   --   --   --  6.1*  --    GFR: Estimated Creatinine Clearance: 9.8 mL/min (A) (by C-G formula based on SCr of 5.45 mg/dL (H)). Liver Function Tests: Recent Labs  Lab 02/17/18 0232 02/21/18 0335  ALBUMIN 2.0* 3.3*   No results for input(s): LIPASE, AMYLASE in the last 168 hours. No results for input(s): AMMONIA in the last 168 hours. Coagulation Profile: No results for input(s): INR, PROTIME in the last 168 hours. Cardiac Enzymes: No results for input(s): CKTOTAL, CKMB, CKMBINDEX, TROPONINI in the last 168 hours. BNP (last 3 results) No results for input(s): PROBNP in the last 8760 hours. HbA1C: No results for input(s): HGBA1C in the last 72 hours. CBG: Recent Labs  Lab  02/21/18 0752 02/21/18 1149 02/21/18 1703 02/21/18 2157 02/22/18 0752  GLUCAP 271* 400* 345* 274* 104*   Lipid Profile: No results for input(s): CHOL, HDL, LDLCALC, TRIG, CHOLHDL, LDLDIRECT in the last 72 hours. Thyroid Function Tests: No results for input(s): TSH, T4TOTAL, FREET4, T3FREE, THYROIDAB in the last 72 hours. Anemia Panel: No results for input(s): VITAMINB12, FOLATE, FERRITIN, TIBC, IRON, RETICCTPCT in the last 72 hours. Urine analysis:    Component Value Date/Time   COLORURINE YELLOW 02/12/2018 1801   APPEARANCEUR HAZY (A) 02/12/2018 1801   LABSPEC 1.013 02/12/2018 1801   PHURINE 5.0 02/12/2018 1801   GLUCOSEU NEGATIVE 02/12/2018 1801   HGBUR LARGE (A) 02/12/2018 1801   HGBUR trace-intact 03/08/2010 1452   BILIRUBINUR NEGATIVE 02/12/2018 1801   KETONESUR NEGATIVE 02/12/2018 1801   PROTEINUR 100 (A) 02/12/2018 1801   UROBILINOGEN 0.2 03/08/2010 1452   NITRITE NEGATIVE 02/12/2018 1801   LEUKOCYTESUR NEGATIVE 02/12/2018 1801   Sepsis Labs: @LABRCNTIP (procalcitonin:4,lacticidven:4)  ) Recent Results (from the past 240 hour(s))  Culture, blood (routine x 2)     Status: None   Collection Time: 02/12/18  7:50 PM  Result Value Ref Range Status   Specimen Description BLOOD RIGHT ANTECUBITAL  Final   Special Requests   Final    BOTTLES DRAWN AEROBIC AND ANAEROBIC Blood Culture adequate volume   Culture   Final    NO GROWTH 5 DAYS Performed at [facility], [place] [address], [place], [place] [address]    Report Status 02/17/2018 FINAL  Final  Culture, blood (routine x 2)     Status: None   Collection Time: 02/12/18  8:07 PM  Result Value Ref Range Status   Specimen Description BLOOD LEFT ANTECUBITAL  Final   Special Requests   Final    BOTTLES DRAWN AEROBIC AND ANAEROBIC Blood Culture adequate volume   Culture   Final    NO GROWTH 5 DAYS Performed at [facility], [place] [address], [place], [place] [address]    Report Status 02/17/2018 FINAL  Final   Respiratory Panel by PCR     Status: None   Collection Time: 02/13/18  8:48 AM  Result Value Ref Range Status   Adenovirus NOT DETECTED NOT DETECTED Final   Coronavirus 229E NOT DETECTED NOT DETECTED Final   Coronavirus HKU1 NOT DETECTED NOT DETECTED Final   Coronavirus NL63 NOT DETECTED NOT DETECTED Final   Coronavirus OC43 NOT DETECTED NOT DETECTED Final   Metapneumovirus NOT DETECTED NOT DETECTED Final  Rhinovirus / Enterovirus NOT DETECTED NOT DETECTED Final   Influenza A NOT DETECTED NOT DETECTED Final   Influenza B NOT DETECTED NOT DETECTED Final   Parainfluenza Virus 1 NOT DETECTED NOT DETECTED Final   Parainfluenza Virus 2 NOT DETECTED NOT DETECTED Final   Parainfluenza Virus 3 NOT DETECTED NOT DETECTED Final   Parainfluenza Virus 4 NOT DETECTED NOT DETECTED Final   Respiratory Syncytial Virus NOT DETECTED NOT DETECTED Final   Bordetella pertussis NOT DETECTED NOT DETECTED Final   Chlamydophila pneumoniae NOT DETECTED NOT DETECTED Final   Mycoplasma pneumoniae NOT DETECTED NOT DETECTED Final  MRSA PCR Screening     Status: None   Collection Time: 02/13/18  1:55 PM  Result Value Ref Range Status   MRSA by PCR NEGATIVE NEGATIVE Final    Comment:        The GeneXpert MRSA Assay (FDA approved for NASAL specimens only), is one component of a comprehensive MRSA colonization surveillance program. It is not intended to diagnose MRSA infection nor to guide or monitor treatment for MRSA infections. Performed at [facility], [place] [address] [place], [place] [address]       Radiology Studies: No results found.   Scheduled Meds: . Chlorhexidine Gluconate Cloth  6 each Topical Q0600  . cyclophosphamide  100 mg Oral Daily  . famotidine  20 mg Oral Daily  . insulin aspart  0-20 Units Subcutaneous TID WC  . insulin aspart  0-5 Units Subcutaneous QHS  . insulin aspart  3 Units Subcutaneous TID WC  . insulin glargine  15 Units Subcutaneous Daily  . multivitamin with  minerals  1 tablet Oral Daily  . predniSONE  60 mg Oral Q breakfast  . sodium chloride flush  3 mL Intravenous Q12H  . sulfamethoxazole-trimethoprim  1 tablet Oral Once per day on Mon Wed Fri  . thiamine  100 mg Oral Daily   Continuous Infusions: . sodium chloride    . sodium chloride    . sodium chloride       LOS: 10 days   Time Spent in minutes   45 minutes   Jovane Foutz D.O. on 02/22/2018 at 11:52 AM  Between 7am to 7pm - Pager - [phone]  After 7pm go to [email or web address] - password [email or web address]  And look for the night coverage person covering for me after hours  [facility] Office  [phone]

## 2018-02-22 NOTE — Progress Notes (Signed)
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 Chlorhexidine Gluconate Cloth  6 each Topical Q0600  . cyclophosphamide  100 mg Oral Daily  . famotidine  20 mg Oral Daily  . insulin aspart  0-20 Units Subcutaneous TID WC  . insulin aspart  0-5 Units Subcutaneous QHS  . insulin aspart  3 Units Subcutaneous TID WC  . insulin glargine  15 Units Subcutaneous Daily  . multivitamin with minerals  1 tablet Oral Daily  . predniSONE  60 mg Oral Q breakfast  . sodium chloride flush  3 mL Intravenous Q12H  . sulfamethoxazole-trimethoprim  1 tablet Oral Once per day on Mon Wed Fri  . thiamine  100 mg Oral Daily   Continuous Infusions: . sodium chloride    . sodium chloride    . 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 The appearance of the lungs is nonspecific, but there are imaging findings that are suggestive of cryptogenic organizing pneumonia (COP). Other differential considerations include both cardiogenic and noncardiogenic edema, however, this is not strongly favored. 2. Aortic atherosclerosis.   Ref. Range 02/14/2018 11:15  Myeloperoxidase Abs Latest Ref Range: 0.0 - 9.0 U/mL 44.7 (H)    Ref. Range 02/14/2018 11:15  ANCA Proteinase 3 Latest Ref Range: 0.0 - 3.5 U/mL <3.5    Ref. Range  02/12/2018 23:44  Anti Nuclear Antibody(ANA) Latest Ref Range: Negative  Positive (A)    Ref. Range 02/12/2018 23:44  HIV Screen 4th Generation wRfx Latest Ref Range: Non Reactive  Non Reactive    Ref. Range 02/17/2018 01:50  Protein Creatinine Ratio Latest Ref Range: 0.00 - 0.15 mg/mgCre 2.52 (H)   Results for SHUNTA, MCLAURIN (MRN [id number]) as of 02/20/2018 11:13  Ref. 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 Increased echogenicity. No mass or hydronephrosis visualized. Left Kidney: Length: 10.9 cm. Increased echogenicity. No mass or hydronephrosis Visualized. Bladder: Decompressed by a Foley catheter. IMPRESSION: Increased renal cortical echogenicity bilaterally, consistent with medical renal disease  Physical Exam:  Blood pressure (!) 148/71, pulse 63, temperature 98.4 F (36.9 C), temperature source Oral, resp. rate 16, height 5\' 1"  (1.549 m), weight 77.4 kg (170 lb 10.2 oz), SpO2 100 %. NAD, lying prone RRR CTAB No LEE No rashes/noted R IJ Temp HD cath present  A 1. 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 Started TPE, 1PV x7, Albumin on 6/12, 6/13, 6/15 2. Started HD on 6/12, 6/13, 6/15 3. Solumedrol Pulse started 6/11-6/13, now pred  60mg /d 4. Started cyclophosphamide 100mg /d 02/18/18, monitor for leukopenia 5. TMP/SMX DS for PCP prevention start 02/20/18 6. Has TEMP HD cath 2. Cocaine use 6/5 UDS positive for cocaine 3. S pneumoniae PNA on ceftriaxone; also on steroids 4. 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 NSAID use prior to admission 6. HCV  P 1. TPE #3 today 2. HD #3 today, then monitor over weekend for renal recovery: 3h, 2K, 2.5 Ca, 1-2L UF, No heparin,  Qb 350 3. Cont pred/cytoxoan 4. Daily weights, Daily Renal Panel, Strict I/Os, Avoid nephrotoxins (NSAIDs, judicious IV Contrast)   Pearson Grippe MD 02/22/2018, 10:57 AM  Recent Labs  Lab 02/17/18 0232  02/20/18 0303 02/20/18 1603 02/21/18 0335 02/22/18 0400  NA 138   < > 137 137 138 137  K 4.8   < > 5.4* 4.2 4.8 4.6  CL 100*   < > 103 100* 108 105  CO2 27   < > 25  --  22 23  GLUCOSE 177*   < > 388* 368* 286* 125*  BUN 86*   < > 80* 40* 62* 90*  CREATININE 7.75*   < > 5.61* 3.10* 4.35* 5.45*  CALCIUM 7.3*   < > 8.2*  --  8.1* 8.2*  PHOS 5.5*  --   --   --  6.1*  --    < > = values in this interval not displayed.   Recent Labs  Lab 02/16/18 0233 02/18/18 0315 02/19/18 0859  02/20/18 1603 02/21/18 0335 02/22/18 0400  WBC 13.7* 8.5 11.9*  --   --  12.1* 17.9*  NEUTROABS 12.0* 7.6  --   --   --   --   --   HGB 11.0* 10.3* 10.5*   < > 10.5* 9.8* 9.8*  HCT 33.2* 31.8* 32.5*   < > 31.0* 30.7* 29.9*  MCV 79.0 79.7 79.9  --   --  81.2 79.1  PLT 283 285 310  --   --  179 142*   < > = values in this interval not displayed.

## 2018-02-23 DIAGNOSIS — I288 Other diseases of pulmonary vessels: Secondary | ICD-10-CM

## 2018-02-23 LAB — CBC
HEMATOCRIT: 29.3 % — AB (ref 36.0–46.0)
HEMOGLOBIN: 9.6 g/dL — AB (ref 12.0–15.0)
MCH: 26.2 pg (ref 26.0–34.0)
MCHC: 32.8 g/dL (ref 30.0–36.0)
MCV: 80.1 fL (ref 78.0–100.0)
Platelets: 110 10*3/uL — ABNORMAL LOW (ref 150–400)
RBC: 3.66 MIL/uL — ABNORMAL LOW (ref 3.87–5.11)
RDW: 19.9 % — AB (ref 11.5–15.5)
WBC: 16.5 10*3/uL — ABNORMAL HIGH (ref 4.0–10.5)

## 2018-02-23 LAB — RENAL FUNCTION PANEL
ANION GAP: 11 (ref 5–15)
ANION GAP: 13 (ref 5–15)
Albumin: 3.4 g/dL — ABNORMAL LOW (ref 3.5–5.0)
Albumin: 3.6 g/dL (ref 3.5–5.0)
BUN: 62 mg/dL — ABNORMAL HIGH (ref 6–20)
BUN: 75 mg/dL — ABNORMAL HIGH (ref 6–20)
CHLORIDE: 103 mmol/L (ref 101–111)
CO2: 23 mmol/L (ref 22–32)
CO2: 22 mmol/L (ref 22–32)
CREATININE: 4.74 mg/dL — AB (ref 0.44–1.00)
Calcium: 7.6 mg/dL — ABNORMAL LOW (ref 8.9–10.3)
Calcium: 7.8 mg/dL — ABNORMAL LOW (ref 8.9–10.3)
Chloride: 101 mmol/L (ref 101–111)
Creatinine, Ser: 4.47 mg/dL — ABNORMAL HIGH (ref 0.44–1.00)
GFR calc Af Amer: 11 mL/min — ABNORMAL LOW (ref >60)
GFR calc non Af Amer: 10 mL/min — ABNORMAL LOW (ref >60)
GFR, EST AFRICAN AMERICAN: 10 mL/min — AB (ref >60)
GFR, EST NON AFRICAN AMERICAN: 9 mL/min — AB (ref >60)
GLUCOSE: 321 mg/dL — AB (ref 65–99)
Glucose, Bld: 172 mg/dL — ABNORMAL HIGH (ref 65–99)
PHOSPHORUS: 6.9 mg/dL — AB (ref 2.5–4.6)
POTASSIUM: 4.7 mmol/L (ref 3.5–5.1)
Phosphorus: 5.4 mg/dL — ABNORMAL HIGH (ref 2.5–4.6)
Potassium: 4.2 mmol/L (ref 3.5–5.1)
Sodium: 137 mmol/L (ref 135–145)
Sodium: 136 mmol/L (ref 135–145)

## 2018-02-23 LAB — GLUCOSE, CAPILLARY
GLUCOSE-CAPILLARY: 227 mg/dL — AB (ref 65–99)
GLUCOSE-CAPILLARY: 147 mg/dL — AB (ref 65–99)
GLUCOSE-CAPILLARY: 243 mg/dL — AB (ref 65–99)
Glucose-Capillary: 195 mg/dL — ABNORMAL HIGH (ref 65–99)

## 2018-02-23 NOTE — Progress Notes (Signed)
PROGRESS NOTE    Rhonda Lynch  JEH:[id number] DOB: Jan 21, 1956 DOA: 02/12/2018 PCP: Patient, No Pcp Per   Brief Narrative:  HPI On 02/12/2018 by Dr. Christia Reading Opyd Rhonda Lynch is a 62 y.o. female with medical history significant for cocaine and alcohol abuse, having rarely seen a physician in the last decade, now presenting to the emergency department for evaluation abdominal pain.  Patient reports 1 week of stabbing pain in the epigastrium and called EMS.  She reports associated nausea without vomiting, and upon direct questioning acknowledges recent shortness of breath and productive cough for the past 1 to 2 weeks.  She reports only minimal alcohol use though family notes she is a heavy daily drinker.  Patient reports that she has not smoked crack in years.  She denies fevers, chills, or chest pain.  Reports that her urine has become darker recently.  Reports occasional mild headache without change in vision or hearing or focal numbness or weakness.  She denies melena or hematochezia.  Interim history Admitted for possible sepsis secondary to pneumonia complicated by acute hypoxic respiratory failure and acute kidney injury.   Assessment & Plan   Sepsis secondary to Streptococcus Bilateral pneumonia/Multilobar with cryptogenic organizing pneumonia -Sepsis morphology resolved -Chest x-ray on admission showed confluent airspace disease, R > L -Placed on azithromycin and ceftriaxone, course completed -CT chest showed possible findings suggestive of cryptogenic organizing pneumonia -Patient also found to have vasculitis, suspect this is also affecting her pulmonary system -Pulmonology consulted and appreciated: Recommended steroids, supplemental oxygen, 7 days of Rocephin for strep positive urine antigen -Pulm recommendations: Continue Solu-Medrol, 250 mg IV every 6 through 02/20/2018.  On 02/21/2018, will change to prednisone 40 mg daily -Patient will also need a pulmonology outpatient  follow-up -Nephrology started prednisone 60mg  daily  Acute hypoxic respiratory failure -Secondary to the above versus vasculitis -Treatment and plan as above -currently maintaining O2 saturations at 100% on room air  Acute kidney injury with hyperkalemia amd /rapid progressive glomerulonephritis/MPO ANCA vasculitis -Nephrology consulted and appreciated -Creatinine on admission 7.58 -Status post kidney biopsy on 02/17/2018 -Started hemodialysis and therapeutic plasma exchange (TPE) -Creatinine currently 4.47 -Cytoxan as well as steroids -Noted on Bactrim on Monday Wednesday Friday -continue to monitor BMP -Discussed with Dr. Joelyn Oms, felt patient would need to be on prednisone 60mg  for at least the next month and will have a prolonged hospital course and will need tunneled cath. Feels patient should remained hospitalized for the entirety of TPE  Hyperglycemia -No history of diabetes mellitus -Secondary to steroids -Continue lantus, ISS and CBG monitoring  Non-anion gap metabolic acidosis -resolved   Anemia of chronic disease/anemia of iron deficiency -Hemoglobin as low as 6.7 -Received 2 units PRBC IV iron -Hemoglobin currently 9.6 -Continue to monitor CBC  Alcohol and cocaine abuse -Counseled -Currently not having withdrawal symptoms  Alcohol induced gastritis -Continue famotidine  Physical deconditioning -PT evaluated patient and recommended home health PT, 24-hour supervision. -Case management consulted  Acute metabolic encephalopathy -Resolved -likely secondary to steroids -given one dose of ativan -today appears to be AAOx3 -continue to monitor closely   DVT Prophylaxis  SCDs  Code Status: Full  Family Communication: None at bedside  Disposition Plan: Admitted. Dispo pending.  Consultants Pulmonology Nephrology  Procedures  Renal ultrasound Ultrasound-guided core biopsy of left kidney  Antibiotics   Anti-infectives (From admission, onward)    Start     Dose/Rate Route Frequency Ordered Stop   02/21/18 0900  sulfamethoxazole-trimethoprim (BACTRIM DS,SEPTRA DS) 800-160 MG per  tablet 1 tablet     1 tablet Oral Once per day on Mon Wed Fri 02/20/18 1116     02/13/18 1900  cefTRIAXone (ROCEPHIN) 1 g in sodium chloride 0.9 % 100 mL IVPB     1 g 200 mL/hr over 30 Minutes Intravenous Every 24 hours 02/12/18 2122 02/18/18 2014   02/13/18 1900  azithromycin (ZITHROMAX) 500 mg in sodium chloride 0.9 % 250 mL IVPB  Status:  Discontinued     500 mg 250 mL/hr over 60 Minutes Intravenous Every 24 hours 02/12/18 2122 02/14/18 0819   02/12/18 1915  cefTRIAXone (ROCEPHIN) 2 g in sodium chloride 0.9 % 100 mL IVPB  Status:  Discontinued     2 g 200 mL/hr over 30 Minutes Intravenous Every 24 hours 02/12/18 1901 02/12/18 2125   02/12/18 1915  azithromycin (ZITHROMAX) 500 mg in sodium chloride 0.9 % 250 mL IVPB  Status:  Discontinued     500 mg 250 mL/hr over 60 Minutes Intravenous Every 24 hours 02/12/18 1901 02/12/18 2125      Subjective:   Rhonda Lynch seen and examined today.  No complaints today.  Denies chest pain, shortness of breath, abdominal pain, nausea or vomiting, diarrhea constipation, dizziness or headache.  Objective:   Vitals:   02/22/18 1936 02/23/18 0057 02/23/18 0500 02/23/18 0735  BP: (!) 143/82 136/76  (!) 159/73  Pulse: 66 (!) 58  (!) 55  Resp: 17 14    Temp: 98.8 F (37.1 C) 98.2 F (36.8 C)  98.5 F (36.9 C)  TempSrc: Oral Oral  Oral  SpO2: 100% 100%  100%  Weight:   75.2 kg (165 lb 12.6 oz)   Height:        Intake/Output Summary (Last 24 hours) at 02/23/2018 1249 Last data filed at 02/23/2018 0630 Gross per 24 hour  Intake 363 ml  Output 2250 ml  Net -1887 ml   Filed Weights   02/21/18 0500 02/22/18 0433 02/23/18 0500  Weight: 74.2 kg (163 lb 9.3 oz) 77.4 kg (170 lb 10.2 oz) 75.2 kg (165 lb 12.6 oz)   Exam  General: Well developed, well nourished, NAD, appears stated age  HEENT: NCAT, mucous  membranes moist.   Neck: Supple  Cardiovascular: S1 S2 auscultated, no rubs, murmurs or gallops. Regular rate and rhythm.  Respiratory: Clear to auscultation bilaterally with equal chest rise  Abdomen: Soft, nontender, nondistended, + bowel sounds  Extremities: warm dry without cyanosis clubbing or edema  Neuro: AAOx3, nonfocal  Psych: Pleasant, appropriate mood and affect  Data Reviewed: I have personally reviewed following labs and imaging studies  CBC: Recent Labs  Lab 02/18/18 0315 02/19/18 0859 02/19/18 1217 02/20/18 1603 02/21/18 0335 02/22/18 0400 02/23/18 0324  WBC 8.5 11.9*  --   --  12.1* 17.9* 16.5*  NEUTROABS 7.6  --   --   --   --   --   --   HGB 10.3* 10.5* 10.9* 10.5* 9.8* 9.8* 9.6*  HCT 31.8* 32.5* 32.0* 31.0* 30.7* 29.9* 29.3*  MCV 79.7 79.9  --   --  81.2 79.1 80.1  PLT 285 310  --   --  179 142* 425*   Basic Metabolic Panel: Recent Labs  Lab 02/17/18 0232  02/19/18 0308  02/20/18 0303 02/20/18 1603 02/21/18 0335 02/22/18 0400 02/23/18 0324  NA 138   < > 135   < > 137 137 138 137 137  K 4.8   < > 5.6*   < > 5.4* 4.2  4.8 4.6 4.7  CL 100*   < > 98*   < > 103 100* 108 105 103  CO2 27   < > 24  --  25  --  22 23 23   GLUCOSE 177*   < > 337*   < > 388* 368* 286* 125* 321*  BUN 86*   < > 110*   < > 80* 40* 62* 90* 62*  CREATININE 7.75*   < > 7.45*   < > 5.61* 3.10* 4.35* 5.45* 4.47*  CALCIUM 7.3*   < > 7.7*  --  8.2*  --  8.1* 8.2* 7.6*  PHOS 5.5*  --   --   --   --   --  6.1*  --  6.9*   < > = values in this interval not displayed.   GFR: Estimated Creatinine Clearance: 11.8 mL/min (A) (by C-G formula based on SCr of 4.47 mg/dL (H)). Liver Function Tests: Recent Labs  Lab 02/17/18 0232 02/21/18 0335 02/23/18 0324  ALBUMIN 2.0* 3.3* 3.4*   No results for input(s): LIPASE, AMYLASE in the last 168 hours. No results for input(s): AMMONIA in the last 168 hours. Coagulation Profile: No results for input(s): INR, PROTIME in the last 168  hours. Cardiac Enzymes: No results for input(s): CKTOTAL, CKMB, CKMBINDEX, TROPONINI in the last 168 hours. BNP (last 3 results) No results for input(s): PROBNP in the last 8760 hours. HbA1C: No results for input(s): HGBA1C in the last 72 hours. CBG: Recent Labs  Lab 02/22/18 0752 02/22/18 1150 02/22/18 2034 02/23/18 0729 02/23/18 1146  GLUCAP 104* 229* 175* 195* 227*   Lipid Profile: No results for input(s): CHOL, HDL, LDLCALC, TRIG, CHOLHDL, LDLDIRECT in the last 72 hours. Thyroid Function Tests: No results for input(s): TSH, T4TOTAL, FREET4, T3FREE, THYROIDAB in the last 72 hours. Anemia Panel: No results for input(s): VITAMINB12, FOLATE, FERRITIN, TIBC, IRON, RETICCTPCT in the last 72 hours. Urine analysis:    Component Value Date/Time   COLORURINE YELLOW 02/12/2018 1801   APPEARANCEUR HAZY (A) 02/12/2018 1801   LABSPEC 1.013 02/12/2018 1801   PHURINE 5.0 02/12/2018 1801   GLUCOSEU NEGATIVE 02/12/2018 1801   HGBUR LARGE (A) 02/12/2018 1801   HGBUR trace-intact 03/08/2010 1452   BILIRUBINUR NEGATIVE 02/12/2018 1801   KETONESUR NEGATIVE 02/12/2018 1801   PROTEINUR 100 (A) 02/12/2018 1801   UROBILINOGEN 0.2 03/08/2010 1452   NITRITE NEGATIVE 02/12/2018 1801   LEUKOCYTESUR NEGATIVE 02/12/2018 1801   Sepsis Labs: @LABRCNTIP (procalcitonin:4,lacticidven:4)  ) Recent Results (from the past 240 hour(s))  MRSA PCR Screening     Status: None   Collection Time: 02/13/18  1:55 PM  Result Value Ref Range Status   MRSA by PCR NEGATIVE NEGATIVE Final    Comment:        The GeneXpert MRSA Assay (FDA approved for NASAL specimens only), is one component of a comprehensive MRSA colonization surveillance program. It is not intended to diagnose MRSA infection nor to guide or monitor treatment for MRSA infections. Performed at [facility], [place] [address], [place], [place] [address]       Radiology Studies: No results found.   Scheduled Meds: . Chlorhexidine  Gluconate Cloth  6 each Topical Q0600  . cyclophosphamide  100 mg Oral Daily  . famotidine  20 mg Oral Daily  . insulin aspart  0-20 Units Subcutaneous TID WC  . insulin aspart  0-5 Units Subcutaneous QHS  . insulin aspart  3 Units Subcutaneous TID WC  . insulin glargine  15 Units Subcutaneous Daily  . multivitamin with minerals  1 tablet Oral Daily  . predniSONE  60 mg Oral Q breakfast  . sodium chloride flush  3 mL Intravenous Q12H  . sulfamethoxazole-trimethoprim  1 tablet Oral Once per day on Mon Wed Fri  . thiamine  100 mg Oral Daily   Continuous Infusions: . sodium chloride    . sodium chloride    . sodium chloride       LOS: 11 days   Time Spent in minutes   30 minutes   Demonica Farrey D.O. on 02/23/2018 at 12:49 PM  Between 7am to 7pm - Pager - [phone]  After 7pm go to [email or web address] - password [email or web address]  And look for the night coverage person covering for me after hours  [facility] Office  [phone]

## 2018-02-23 NOTE — Progress Notes (Signed)
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 Chlorhexidine Gluconate Cloth  6 each Topical Q0600  . cyclophosphamide  100 mg Oral Daily  . famotidine  20 mg Oral Daily  . insulin aspart  0-20 Units Subcutaneous TID WC  . insulin aspart  0-5 Units Subcutaneous QHS  . insulin aspart  3 Units Subcutaneous TID WC  . insulin glargine  15 Units Subcutaneous Daily  . multivitamin with minerals  1 tablet Oral Daily  . predniSONE  60 mg Oral Q breakfast  . sodium chloride flush  3 mL Intravenous Q12H  . sulfamethoxazole-trimethoprim  1 tablet Oral Once per day on Mon Wed Fri  . thiamine  100 mg Oral Daily   Continuous Infusions: . sodium chloride    . sodium chloride    . 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 The appearance of the lungs is nonspecific, but there are imaging findings that are suggestive of cryptogenic organizing pneumonia (COP). Other differential considerations include both cardiogenic and noncardiogenic edema, however, this is not strongly favored. 2. Aortic atherosclerosis.   Ref. Range 02/14/2018 11:15  Myeloperoxidase Abs Latest Ref Range: 0.0 - 9.0 U/mL 44.7 (H)    Ref. Range 02/14/2018 11:15  ANCA Proteinase 3 Latest Ref Range: 0.0 - 3.5 U/mL <3.5    Ref. Range 02/12/2018  23:44  Anti Nuclear Antibody(ANA) Latest Ref Range: Negative  Positive (A)    Ref. Range 02/12/2018 23:44  HIV Screen 4th Generation wRfx Latest Ref Range: Non Reactive  Non Reactive    Ref. Range 02/17/2018 01:50  Protein Creatinine Ratio Latest Ref Range: 0.00 - 0.15 mg/mgCre 2.52 (H)   Results for Rhonda Lynch, Rhonda Lynch (MRN [id number]) as of 02/20/2018 11:13  Ref. 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 Increased echogenicity. No mass or hydronephrosis visualized. Left Kidney: Length: 10.9 cm. Increased echogenicity. No mass or hydronephrosis Visualized. Bladder: Decompressed by a Foley catheter. IMPRESSION: Increased renal cortical echogenicity bilaterally, consistent with medical renal disease  Physical Exam:  Blood pressure (!) 159/73, pulse (!) 55, temperature 98.5 F (36.9 C), temperature source Oral, resp. rate 14, height 5\' 1"  (1.549 m), weight 75.2 kg (165 lb 12.6 oz), SpO2 100 %. NAD, lying prone RRR CTAB No LEE No rashes/noted R IJ Temp HD cath present  A 1. 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 Of pulmonary hemorrhage vs BOOP (pulm has seen) 1. Started TPE, 1PV x7, Albumin on 6/12, 6/13, 6/15 2. Started HD on 6/12, 6/13, 6/15 3. Solumedrol Pulse started 6/11-6/13, now pred  60mg /d 4. Started cyclophosphamide 100mg /d 02/18/18, monitor for leukopenia 5. TMP/SMX DS for PCP prevention start 02/20/18 6. Has TEMP HD cath 2. Cocaine use 6/5 UDS positive for cocaine 3. S pneumoniae PNA on ceftriaxone; also on steroids 4. 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 NSAID use prior to admission 6. HCV Ab Positive -- can f/u as outpt  P 1. TPE #4 2. Follow daily for HD needs 3. I favor inpatient stay for entirety of her TPE   4. Cont pred/cytoxoan 5. Daily weights, Daily Renal Panel, Strict I/Os, Avoid nephrotoxins (NSAIDs, judicious IV Contrast)   Pearson Grippe MD 02/23/2018, 12:04 PM  Recent Labs  Lab 02/17/18 0232  02/21/18 0335 02/22/18 0400 02/23/18 0324  NA 138   < > 138 137 137  K 4.8   < > 4.8 4.6 4.7  CL 100*   < > 108 105 103  CO2 27   < > 22 23 23   GLUCOSE 177*   < > 286* 125* 321*  BUN 86*   < > 62* 90* 62*  CREATININE 7.75*   < > 4.35* 5.45* 4.47*  CALCIUM 7.3*   < > 8.1* 8.2* 7.6*  PHOS 5.5*  --  6.1*  --  6.9*   < > = values in this interval not displayed.   Recent Labs  Lab 02/18/18 0315  02/21/18 0335 02/22/18 0400 02/23/18 0324  WBC 8.5   < > 12.1* 17.9* 16.5*  NEUTROABS 7.6  --   --   --   --   HGB 10.3*   < > 9.8* 9.8* 9.6*  HCT 31.8*   < > 30.7* 29.9* 29.3*  MCV 79.7   < > 81.2 79.1 80.1  PLT 285   < > 179 142* 110*   < > = values in this interval not displayed.

## 2018-02-24 LAB — GLUCOSE, CAPILLARY
GLUCOSE-CAPILLARY: 296 mg/dL — AB (ref 65–99)
GLUCOSE-CAPILLARY: 245 mg/dL — AB (ref 65–99)
Glucose-Capillary: 75 mg/dL (ref 65–99)
Glucose-Capillary: 128 mg/dL — ABNORMAL HIGH (ref 65–99)

## 2018-02-24 LAB — CBC WITH DIFFERENTIAL/PLATELET
Abs Immature Granulocytes: 0.1 10*3/uL (ref 0.0–0.1)
BASOS ABS: 0 10*3/uL (ref 0.0–0.1)
BASOS PCT: 0 %
EOS PCT: 2 %
Eosinophils Absolute: 0.3 10*3/uL (ref 0.0–0.7)
HCT: 32.3 % — ABNORMAL LOW (ref 36.0–46.0)
Hemoglobin: 10.4 g/dL — ABNORMAL LOW (ref 12.0–15.0)
Immature Granulocytes: 1 %
Lymphocytes Relative: 17 %
Lymphs Abs: 2.6 10*3/uL (ref 0.7–4.0)
MCH: 25.9 pg — AB (ref 26.0–34.0)
MCHC: 32.2 g/dL (ref 30.0–36.0)
MCV: 80.3 fL (ref 78.0–100.0)
MONO ABS: 1.3 10*3/uL — AB (ref 0.1–1.0)
MONOS PCT: 8 %
Neutro Abs: 11.5 10*3/uL — ABNORMAL HIGH (ref 1.7–7.7)
Neutrophils Relative %: 72 %
PLATELETS: 105 10*3/uL — AB (ref 150–400)
RBC: 4.02 MIL/uL (ref 3.87–5.11)
RDW: 20.3 % — ABNORMAL HIGH (ref 11.5–15.5)
WBC: 15.9 10*3/uL — ABNORMAL HIGH (ref 4.0–10.5)

## 2018-02-24 MED ORDER — ACD FORMULA A 0.73-2.45-2.2 GM/100ML VI SOLN
Status: AC
  Administered 2018-02-24: 500 mL via INTRAVENOUS
  Filled 2018-02-24: qty 500

## 2018-02-24 MED ORDER — CALCIUM CARBONATE ANTACID 500 MG PO CHEW
2.0000 | CHEWABLE_TABLET | ORAL | Status: AC
  Administered 2018-02-24 (×2): 400 mg via ORAL
  Filled 2018-02-24: qty 2

## 2018-02-24 MED ORDER — ACD FORMULA A 0.73-2.45-2.2 GM/100ML VI SOLN
500.0000 mL | Status: DC
  Administered 2018-02-24: 500 mL via INTRAVENOUS
  Filled 2018-02-24 (×2): qty 500

## 2018-02-24 MED ORDER — SODIUM CHLORIDE 0.9 % IV SOLN
INTRAVENOUS | Status: AC
  Administered 2018-02-24 (×2): via INTRAVENOUS_CENTRAL
  Filled 2018-02-24 (×3): qty 200

## 2018-02-24 MED ORDER — ACETAMINOPHEN 325 MG PO TABS: 650.0000 mg | ORAL_TABLET | ORAL | Status: DC | PRN

## 2018-02-24 MED ORDER — CALCIUM CARBONATE ANTACID 500 MG PO CHEW
CHEWABLE_TABLET | ORAL | Status: AC
  Administered 2018-02-24: 400 mg via ORAL
  Filled 2018-02-24: qty 2

## 2018-02-24 MED ORDER — DIPHENHYDRAMINE HCL 25 MG PO CAPS: 25.0000 mg | ORAL_CAPSULE | Freq: Four times a day (QID) | ORAL | Status: DC | PRN

## 2018-02-24 MED ORDER — SODIUM CHLORIDE 0.9 % IV SOLN
2.0000 g | Freq: Once | INTRAVENOUS | Status: AC
  Administered 2018-02-24: 2 g via INTRAVENOUS
  Filled 2018-02-24: qty 20

## 2018-02-24 MED ORDER — HEPARIN SODIUM (PORCINE) 1000 UNIT/ML IJ SOLN
1000.0000 [IU] | Freq: Once | INTRAMUSCULAR | Status: AC
  Administered 2018-02-24: 1000 [IU]
  Filled 2018-02-24: qty 1

## 2018-02-24 NOTE — Progress Notes (Signed)
PROGRESS NOTE    Rhonda Lynch  YWV:[id number] DOB: 1956/01/05 DOA: 02/12/2018 PCP: Patient, No Pcp Per   Brief Narrative:  HPI On 02/12/2018 by Dr. Christia Reading Opyd Rhonda Lynch is a 62 y.o. female with medical history significant for cocaine and alcohol abuse, having rarely seen a physician in the last decade, now presenting to the emergency department for evaluation abdominal pain.  Patient reports 1 week of stabbing pain in the epigastrium and called EMS.  She reports associated nausea without vomiting, and upon direct questioning acknowledges recent shortness of breath and productive cough for the past 1 to 2 weeks.  She reports only minimal alcohol use though family notes she is a heavy daily drinker.  Patient reports that she has not smoked crack in years.  She denies fevers, chills, or chest pain.  Reports that her urine has become darker recently.  Reports occasional mild headache without change in vision or hearing or focal numbness or weakness.  She denies melena or hematochezia.  Interim history Admitted for possible sepsis secondary to pneumonia complicated by acute hypoxic respiratory failure and acute kidney injury.   Assessment & Plan   Sepsis secondary to Streptococcus Bilateral pneumonia/Multilobar with cryptogenic organizing pneumonia -Sepsis morphology resolved -Chest x-ray on admission showed confluent airspace disease, R > L -Placed on azithromycin and ceftriaxone, course completed -CT chest showed possible findings suggestive of cryptogenic organizing pneumonia -Patient also found to have vasculitis, suspect this is also affecting her pulmonary system -Pulmonology consulted and appreciated: Recommended steroids, supplemental oxygen, 7 days of Rocephin for strep positive urine antigen -Pulm recommendations: Continue Solu-Medrol, 250 mg IV every 6 through 02/20/2018.  On 02/21/2018, will change to prednisone 40 mg daily -Patient will also need a pulmonology outpatient  follow-up -Nephrology started prednisone 60mg  daily  Acute hypoxic respiratory failure -Secondary to the above versus vasculitis -Treatment and plan as above -currently maintaining O2 saturations at 100% on room air  Acute kidney injury with hyperkalemia amd /rapid progressive glomerulonephritis/MPO ANCA vasculitis -Nephrology consulted and appreciated -Creatinine on admission 7.58 -Status post kidney biopsy on 02/17/2018 -Started hemodialysis and therapeutic plasma exchange (TPE) -Creatinine currently 4.74 -Cytoxan as well as steroids -Noted on Bactrim on Monday Wednesday Friday -continue to monitor BMP -Discussed with Dr. Joelyn Oms, felt patient would need to be on prednisone 60mg  for at least the next month and will have a prolonged hospital course and will need tunneled cath. Feels patient should remained hospitalized for the entirety of TPE  Hyperglycemia -No history of diabetes mellitus -Secondary to steroids -Continue lantus, ISS and CBG monitoring  Non-anion gap metabolic acidosis -resolved   Anemia of chronic disease/anemia of iron deficiency -Hemoglobin as low as 6.7 -Received 2 units PRBC IV iron -Hemoglobin currently 10.4 -Continue to monitor CBC  Alcohol and cocaine abuse -Counseled -Currently not having withdrawal symptoms  Alcohol induced gastritis -Continue famotidine  Physical deconditioning -PT evaluated patient and recommended home health PT, 24-hour supervision. -Case management consulted  Acute metabolic encephalopathy -Resolved -likely secondary to steroids -given one dose of ativan -today appears to be AAOx3 -continue to monitor closely   Hepatitis C  -Ab positive- can follow up as an oupatient  DVT Prophylaxis  SCDs  Code Status: Full  Family Communication: None at bedside  Disposition Plan: Admitted. Dispo pending nephrology consultations  Consultants Pulmonology Nephrology  Procedures  Renal ultrasound Ultrasound-guided core  biopsy of left kidney  Antibiotics   Anti-infectives (From admission, onward)   Start     Dose/Rate Route Frequency  Ordered Stop   02/21/18 0900  sulfamethoxazole-trimethoprim (BACTRIM DS,SEPTRA DS) 800-160 MG per tablet 1 tablet     1 tablet Oral Once per day on Mon Wed Fri 02/20/18 1116     02/13/18 1900  cefTRIAXone (ROCEPHIN) 1 g in sodium chloride 0.9 % 100 mL IVPB     1 g 200 mL/hr over 30 Minutes Intravenous Every 24 hours 02/12/18 2122 02/18/18 2014   02/13/18 1900  azithromycin (ZITHROMAX) 500 mg in sodium chloride 0.9 % 250 mL IVPB  Status:  Discontinued     500 mg 250 mL/hr over 60 Minutes Intravenous Every 24 hours 02/12/18 2122 02/14/18 0819   02/12/18 1915  cefTRIAXone (ROCEPHIN) 2 g in sodium chloride 0.9 % 100 mL IVPB  Status:  Discontinued     2 g 200 mL/hr over 30 Minutes Intravenous Every 24 hours 02/12/18 1901 02/12/18 2125   02/12/18 1915  azithromycin (ZITHROMAX) 500 mg in sodium chloride 0.9 % 250 mL IVPB  Status:  Discontinued     500 mg 250 mL/hr over 60 Minutes Intravenous Every 24 hours 02/12/18 1901 02/12/18 2125      Subjective:   Rhonda Lynch seen and examined today.  No complaints today. Denies chest pain, shortness of breath, abdominal pain, nausea, vomiting, diarrhea, constipation.   Objective:   Vitals:   02/23/18 1652 02/24/18 0022 02/24/18 0500 02/24/18 0819  BP: (!) 131/58 (!) 144/66  (!) 142/80  Pulse: 71 67  86  Resp: 18 19  17   Temp: 98.4 F (36.9 C) 98.2 F (36.8 C)  98.5 F (36.9 C)  TempSrc: Oral Oral  Oral  SpO2: 94% 100%  100%  Weight:   77 kg (169 lb 12.1 oz)   Height:        Intake/Output Summary (Last 24 hours) at 02/24/2018 1454 Last data filed at 02/24/2018 1400 Gross per 24 hour  Intake 653 ml  Output -  Net 653 ml   Filed Weights   02/22/18 0433 02/23/18 0500 02/24/18 0500  Weight: 77.4 kg (170 lb 10.2 oz) 75.2 kg (165 lb 12.6 oz) 77 kg (169 lb 12.1 oz)   Exam  General: Well developed, well nourished, NAD,  appears stated age  HEENT: NCAT, mucous membranes moist.   Neck: Supple  Cardiovascular: S1 S2 auscultated, RRR, no murmurs  Respiratory: Clear to auscultation bilaterally with equal chest rise  Abdomen: Soft, nontender, nondistended, + bowel sounds  Extremities: warm dry without cyanosis clubbing or edema  Neuro: AAOx3, nonfocal  Psych: Normal affect and demeanor with intact judgement and insight  Data Reviewed: I have personally reviewed following labs and imaging studies  CBC: Recent Labs  Lab 02/18/18 0315 02/19/18 0859  02/20/18 1603 02/21/18 0335 02/22/18 0400 02/23/18 0324 02/24/18 0257  WBC 8.5 11.9*  --   --  12.1* 17.9* 16.5* 15.9*  NEUTROABS 7.6  --   --   --   --   --   --  11.5*  HGB 10.3* 10.5*   < > 10.5* 9.8* 9.8* 9.6* 10.4*  HCT 31.8* 32.5*   < > 31.0* 30.7* 29.9* 29.3* 32.3*  MCV 79.7 79.9  --   --  81.2 79.1 80.1 80.3  PLT 285 310  --   --  179 142* 110* 105*   < > = values in this interval not displayed.   Basic Metabolic Panel: Recent Labs  Lab 02/20/18 0303 02/20/18 1603 02/21/18 0335 02/22/18 0400 02/23/18 0324 02/23/18 1331  NA 137 137 138  137 137 136  K 5.4* 4.2 4.8 4.6 4.7 4.2  CL 103 100* 108 105 103 101  CO2 25  --  22 23 23 22   GLUCOSE 388* 368* 286* 125* 321* 172*  BUN 80* 40* 62* 90* 62* 75*  CREATININE 5.61* 3.10* 4.35* 5.45* 4.47* 4.74*  CALCIUM 8.2*  --  8.1* 8.2* 7.6* 7.8*  PHOS  --   --  6.1*  --  6.9* 5.4*   GFR: Estimated Creatinine Clearance: 11.3 mL/min (A) (by C-G formula based on SCr of 4.74 mg/dL (H)). Liver Function Tests: Recent Labs  Lab 02/21/18 0335 02/23/18 0324 02/23/18 1331  ALBUMIN 3.3* 3.4* 3.6   No results for input(s): LIPASE, AMYLASE in the last 168 hours. No results for input(s): AMMONIA in the last 168 hours. Coagulation Profile: No results for input(s): INR, PROTIME in the last 168 hours. Cardiac Enzymes: No results for input(s): CKTOTAL, CKMB, CKMBINDEX, TROPONINI in the last 168  hours. BNP (last 3 results) No results for input(s): PROBNP in the last 8760 hours. HbA1C: No results for input(s): HGBA1C in the last 72 hours. CBG: Recent Labs  Lab 02/23/18 1146 02/23/18 1649 02/23/18 2245 02/24/18 0820 02/24/18 1215  GLUCAP 227* 147* 243* 75 128*   Lipid Profile: No results for input(s): CHOL, HDL, LDLCALC, TRIG, CHOLHDL, LDLDIRECT in the last 72 hours. Thyroid Function Tests: No results for input(s): TSH, T4TOTAL, FREET4, T3FREE, THYROIDAB in the last 72 hours. Anemia Panel: No results for input(s): VITAMINB12, FOLATE, FERRITIN, TIBC, IRON, RETICCTPCT in the last 72 hours. Urine analysis:    Component Value Date/Time   COLORURINE YELLOW 02/12/2018 1801   APPEARANCEUR HAZY (A) 02/12/2018 1801   LABSPEC 1.013 02/12/2018 1801   PHURINE 5.0 02/12/2018 1801   GLUCOSEU NEGATIVE 02/12/2018 1801   HGBUR LARGE (A) 02/12/2018 1801   HGBUR trace-intact 03/08/2010 1452   BILIRUBINUR NEGATIVE 02/12/2018 1801   KETONESUR NEGATIVE 02/12/2018 1801   PROTEINUR 100 (A) 02/12/2018 1801   UROBILINOGEN 0.2 03/08/2010 1452   NITRITE NEGATIVE 02/12/2018 1801   LEUKOCYTESUR NEGATIVE 02/12/2018 1801   Sepsis Labs: @LABRCNTIP (procalcitonin:4,lacticidven:4)  ) No results found for this or any previous visit (from the past 240 hour(s)).    Radiology Studies: No results found.   Scheduled Meds: . Chlorhexidine Gluconate Cloth  6 each Topical Q0600  . cyclophosphamide  100 mg Oral Daily  . famotidine  20 mg Oral Daily  . insulin aspart  0-20 Units Subcutaneous TID WC  . insulin aspart  0-5 Units Subcutaneous QHS  . insulin aspart  3 Units Subcutaneous TID WC  . insulin glargine  15 Units Subcutaneous Daily  . multivitamin with minerals  1 tablet Oral Daily  . predniSONE  60 mg Oral Q breakfast  . sodium chloride flush  3 mL Intravenous Q12H  . sulfamethoxazole-trimethoprim  1 tablet Oral Once per day on Mon Wed Fri  . thiamine  100 mg Oral Daily   Continuous  Infusions: . sodium chloride    . sodium chloride    . sodium chloride       LOS: 12 days   Time Spent in minutes   30 minutes   Zniya Cottone D.O. on 02/24/2018 at 2:54 PM  Between 7am to 7pm - Pager - [phone]  After 7pm go to [email or web address] - password [email or web address]  And look for the night coverage person covering for me after hours  [facility] Office  [phone]

## 2018-02-24 NOTE — Progress Notes (Signed)
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 Pulmonary invovlement  Subjective:   For TPE #4 today  06/16 0701 - 06/17 0700 In: 480 [P.O.:480] Out: -   Filed Weights   02/22/18 0433 02/23/18 0500 02/24/18 0500  Weight: 77.4 kg (170 lb 10.2 oz) 75.2 kg (165 lb 12.6 oz) 77 kg (169 lb 12.1 oz)    Scheduled Meds: . Chlorhexidine Gluconate Cloth  6 each Topical Q0600  . cyclophosphamide  100 mg Oral Daily  . famotidine  20 mg Oral Daily  . insulin aspart  0-20 Units Subcutaneous TID WC  . insulin aspart  0-5 Units Subcutaneous QHS  . insulin aspart  3 Units Subcutaneous TID WC  . insulin glargine  15 Units Subcutaneous Daily  . multivitamin with minerals  1 tablet Oral Daily  . predniSONE  60 mg Oral Q breakfast  . sodium chloride flush  3 mL Intravenous Q12H  . sulfamethoxazole-trimethoprim  1 tablet Oral Once per day on Mon Wed Fri  . thiamine  100 mg Oral Daily   Continuous Infusions: . sodium chloride    . sodium chloride    . 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 The appearance of the lungs is nonspecific, but there are imaging findings that are suggestive of cryptogenic organizing pneumonia (COP). Other differential considerations include both cardiogenic and noncardiogenic edema, however, this is not strongly favored. 2. Aortic atherosclerosis.   Ref. Range 02/14/2018 11:15  Myeloperoxidase Abs Latest Ref Range: 0.0 - 9.0 U/mL 44.7 (H)    Ref. Range 02/14/2018 11:15  ANCA Proteinase 3 Latest Ref Range: 0.0 - 3.5 U/mL <3.5    Ref. Range 02/12/2018 23:44  Anti Nuclear Antibody(ANA) Latest Ref Range:  Negative  Positive (A)    Ref. Range 02/12/2018 23:44  HIV Screen 4th Generation wRfx Latest Ref Range: Non Reactive  Non Reactive    Ref. Range 02/17/2018 01:50  Protein Creatinine Ratio Latest Ref Range: 0.00 - 0.15 mg/mgCre 2.52 (H)   Results for MARELI, ANTUNES (MRN [id number]) as of 02/20/2018 11:13  Ref. 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 Increased echogenicity. No mass or hydronephrosis visualized. Left Kidney: Length: 10.9 cm. Increased echogenicity. No mass or hydronephrosis Visualized. Bladder: Decompressed by a Foley catheter. IMPRESSION: Increased renal cortical echogenicity bilaterally, consistent with medical renal disease  Physical Exam:  Blood pressure (!) 142/80, pulse 86, temperature 98.5 F (36.9 C), temperature source Oral, resp. rate 17, height 5\' 1"  (1.549 m), weight 77 kg (169 lb 12.1 oz), SpO2 100 %. GEN: NAD, sitting up in bed eating lunch CV: RRR PULM: CTAB EXT: No LEE No rashes/noted R IJ Temp HD cath present  A/P 1. 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 Of pulmonary hemorrhage vs BOOP (pulm has seen) 1. Started TPE, 1PV x7, Albumin on 6/12, 6/13, 6/15.  For TPE today 6/17. 2. Started HD on 6/12, 6/13, 6/15.  Next  HD planned 6/18. 3. Solumedrol Pulse started 6/11-6/13, now pred 60mg /d 4. Started cyclophosphamide 100mg /d 02/18/18, monitor for leukopenia 5. TMP/SMX DS for PCP prevention start 02/20/18 6. Has TEMP HD cath 2. Cocaine use 6/5 UDS positive for cocaine 3. S pneumoniae PNA on ceftriaxone; also on steroids 4. 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 NSAID use prior to admission 6. HCV Ab Positive -- can f/u as outpt for rx (possible contribution to fibrillary GN so will be  important)  Madelon Lips MD [place] pgr [phone] 02/24/2018, 1:27 PM  Recent Labs  Lab 02/21/18 0335 02/22/18 0400 02/23/18 0324 02/23/18 1331  NA 138 137 137 136  K 4.8 4.6 4.7 4.2  CL 108 105 103 101  CO2 22 23 23 22   GLUCOSE 286* 125* 321* 172*  BUN 62* 90* 62* 75*  CREATININE 4.35* 5.45* 4.47* 4.74*  CALCIUM 8.1* 8.2* 7.6* 7.8*  PHOS 6.1*  --  6.9* 5.4*   Recent Labs  Lab 02/18/18 0315  02/22/18 0400 02/23/18 0324 02/24/18 0257  WBC 8.5   < > 17.9* 16.5* 15.9*  NEUTROABS 7.6  --   --   --  11.5*  HGB 10.3*   < > 9.8* 9.6* 10.4*  HCT 31.8*   < > 29.9* 29.3* 32.3*  MCV 79.7   < > 79.1 80.1 80.3  PLT 285   < > 142* 110* 105*   < > = values in this interval not displayed.

## 2018-02-24 NOTE — Progress Notes (Signed)
Physical Therapy Treatment Patient Details Name: Rhonda Lynch MRN: [id number] DOB: 1956-01-08 Today's Date: 02/24/2018    History of Present Illness Pt is a 62 yo female presenting with epigastric pain and was tachypneic. PMH: cocaine and alcohol abuse. Pt found to have PNA, microctic anemia, renal failurs and HTN urgency.    PT Comments    Continuing work on functional mobility and activity tolerance;  Noting a tendency to reach out for UE support with walk, so she pushed the vitals machine; will consider trying a cane next session (pt would really rather not need an assistive device)   Follow Up Recommendations  Supervision/Assistance - 24 hour;Home health PT     Equipment Recommendations  None recommended by PT    Recommendations for Other Services       Precautions / Restrictions Precautions Precautions: Fall Precaution Comments: watch O2    Mobility  Bed Mobility                  Transfers     Transfers: Sit to/from Stand Sit to Stand: Supervision         General transfer comment: supervision for safety  Ambulation/Gait Ambulation/Gait assistance: Min guard Gait Distance (Feet): 400 Feet Assistive device: None(and occasionally pushing vitals machine) Gait Pattern/deviations: Step-through pattern;Decreased step length - right;Decreased step length - left;Decreased stride length Gait velocity: slow   General Gait Details: Overall improving gait, though noting she tended to reach out for UE support today, so had pt push vitals machine; O2 sats remained greater tahn or equal to 89% throughout this afternoon's walk   Stairs             Wheelchair Mobility    Modified Rankin (Stroke Patients Only)       Balance     Sitting balance-Leahy Scale: Good       Standing balance-Leahy Scale: Fair                              Cognition Arousal/Alertness: Awake/alert Behavior During Therapy: WFL for tasks  assessed/performed Overall Cognitive Status: Within Functional Limits for tasks assessed                                        Exercises      General Comments General comments (skin integrity, edema, etc.): walked on room air and O2 sats remained greater than or equal to 89%      Pertinent Vitals/Pain Pain Assessment: No/denies pain    Home Living                      Prior Function            PT Goals (current goals can now be found in the care plan section) Acute Rehab PT Goals Patient Stated Goal: get better PT Goal Formulation: With patient Time For Goal Achievement: 03/01/18 Potential to Achieve Goals: Good Progress towards PT goals: Progressing toward goals    Frequency    Min 3X/week      PT Plan Current plan remains appropriate    Co-evaluation              AM-PAC PT "6 Clicks" Daily Activity  Outcome Measure  Difficulty turning over in bed (including adjusting bedclothes, sheets and blankets)?: None Difficulty moving from lying on back to sitting  on the side of the bed? : None Difficulty sitting down on and standing up from a chair with arms (e.g., wheelchair, bedside commode, etc,.)?: None Help needed moving to and from a bed to chair (including a wheelchair)?: None Help needed walking in hospital room?: A Little Help needed climbing 3-5 steps with a railing? : A Little 6 Click Score: 22    End of Session Equipment Utilized During Treatment: Oxygen(had the tank, just in case, but didn't need it) Activity Tolerance: Patient tolerated treatment well Patient left: in bed;with call bell/phone within reach(sitting EOB) Nurse Communication: Mobility status PT Visit Diagnosis: Unsteadiness on feet (R26.81);Difficulty in walking, not elsewhere classified (R26.2)     Time: 1470-9295 PT Time Calculation (min) (ACUTE ONLY): 13 min  Charges:  $Gait Training: 8-22 mins                    G Codes:       Roney Marion,  PT  Acute Rehabilitation Services Pager [phone] Office [place] 02/24/2018, 4:03 PM

## 2018-02-25 ENCOUNTER — Inpatient Hospital Stay (HOSPITAL_COMMUNITY): Payer: Medicaid Other

## 2018-02-25 ENCOUNTER — Encounter (HOSPITAL_COMMUNITY): Payer: Self-pay | Admitting: Nephrology

## 2018-02-25 DIAGNOSIS — Z01810 Encounter for preprocedural cardiovascular examination: Secondary | ICD-10-CM

## 2018-02-25 LAB — GLUCOSE, CAPILLARY
GLUCOSE-CAPILLARY: 205 mg/dL — AB (ref 65–99)
GLUCOSE-CAPILLARY: 361 mg/dL — AB (ref 65–99)
GLUCOSE-CAPILLARY: 167 mg/dL — AB (ref 65–99)
Glucose-Capillary: 207 mg/dL — ABNORMAL HIGH (ref 65–99)

## 2018-02-25 LAB — POCT I-STAT, CHEM 8
BUN: 91 mg/dL — AB (ref 6–20)
CALCIUM ION: 1.14 mmol/L — AB (ref 1.15–1.40)
CHLORIDE: 100 mmol/L — AB (ref 101–111)
Creatinine, Ser: 6.5 mg/dL — ABNORMAL HIGH (ref 0.44–1.00)
Glucose, Bld: 289 mg/dL — ABNORMAL HIGH (ref 65–99)
HCT: 29 % — ABNORMAL LOW (ref 36.0–46.0)
Hemoglobin: 9.9 g/dL — ABNORMAL LOW (ref 12.0–15.0)
POTASSIUM: 5 mmol/L (ref 3.5–5.1)
SODIUM: 135 mmol/L (ref 135–145)
TCO2: 22 mmol/L (ref 22–32)

## 2018-02-25 LAB — PROTIME-INR
INR: 1.2
PROTHROMBIN TIME: 15.1 s (ref 11.4–15.2)

## 2018-02-25 LAB — BASIC METABOLIC PANEL
Anion gap: 12 (ref 5–15)
BUN: 107 mg/dL — ABNORMAL HIGH (ref 6–20)
CHLORIDE: 109 mmol/L (ref 101–111)
CO2: 18 mmol/L — AB (ref 22–32)
Calcium: 7.9 mg/dL — ABNORMAL LOW (ref 8.9–10.3)
Creatinine, Ser: 7.09 mg/dL — ABNORMAL HIGH (ref 0.44–1.00)
GFR calc Af Amer: 6 mL/min — ABNORMAL LOW (ref >60)
GFR calc non Af Amer: 5 mL/min — ABNORMAL LOW (ref >60)
Glucose, Bld: 228 mg/dL — ABNORMAL HIGH (ref 65–99)
POTASSIUM: 5.9 mmol/L — AB (ref 3.5–5.1)
SODIUM: 139 mmol/L (ref 135–145)

## 2018-02-25 LAB — CBC
HEMATOCRIT: 29 % — AB (ref 36.0–46.0)
HEMOGLOBIN: 9.4 g/dL — AB (ref 12.0–15.0)
MCH: 25.8 pg — ABNORMAL LOW (ref 26.0–34.0)
MCHC: 32.4 g/dL (ref 30.0–36.0)
MCV: 79.7 fL (ref 78.0–100.0)
Platelets: 99 10*3/uL — ABNORMAL LOW (ref 150–400)
RBC: 3.64 MIL/uL — AB (ref 3.87–5.11)
RDW: 20.4 % — ABNORMAL HIGH (ref 11.5–15.5)
WBC: 11.7 10*3/uL — ABNORMAL HIGH (ref 4.0–10.5)

## 2018-02-25 NOTE — Progress Notes (Signed)
[facility] Progress Note    Assessment/ Plan:   1. 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 Of pulmonary hemorrhage vs BOOP (pulm has seen) 1. Started TPE, 1PV x7, Albumin on 6/12, 6/13, 6/15, 6/17, next for tomorrow 6/19 2. Started HD on 6/12, 6/13, 6/15.  Next HD today 6/18. 3. Solumedrol Pulse started 6/11-6/13, now pred 60mg /d 4. Started cyclophosphamide 100mg /d 02/18/18, monitor for leukopenia- no dosage adjustments required as of yet 5. TMP/SMX DS for PCP prevention start 02/20/18 6. Has TEMP HD cath- IR contacted for [facility] placement today 6/18, appreciate assistance.  Will do vein mapping in case needs fistula 2. Cocaine use 6/5 UDS positive for cocaine 3. S pneumoniae PNA: has finished a course of CTX  4. 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  Rec OP followup, no bronch deemed necessary at this time 5. HCV Ab Positive -- can f/u as outpt for rx (possible contribution to fibrillary GN so will be important) 6. Dispo: pending finishing TPE and determining if pt will need dialysis as OP (I suspect she will based on lack of evidence of renal recovery thus far)     Subjective:    Tolerated TPE #4 yesterday.  HD today.  Nontunneled HD cath draining some serous fluid.  No evidence of renal recovery yet- Cr 7.1 this AM.     Objective:   BP 126/67   Pulse 74   Temp 98.4 F (36.9 C) (Oral)   Resp 14   Ht 5\' 1"  (1.549 m)   Wt 77.3 kg (170 lb 6.7 oz)   SpO2 100%   BMI 32.20 kg/m   Intake/Output Summary (Last 24 hours) at 02/25/2018 0937 Last data filed at 02/25/2018 0300 Gross per 24 hour  Intake 179.75 ml  Output 300 ml  Net -120.25 ml   Weight change: 0.3 kg (10.6 oz)  Physical Exam: GEN: NAD, lying in bed on dialysis, sleeping but arousable CV: RRR PULM: CTAB EXT: No LEE No rashes/noted R IJ Temp HD cath present, some serous fluid draining.    Imaging/ Studies: Renal Bx 6/10:  active pauci immune GN with 70% cellular crescents and very little chronicity / fibrosing; EM with Fibrillary GN as well  HR Chest CT 6/10: IMPRESSION: 1. The appearance of the lungs is nonspecific, but there are imaging findings that are suggestive of cryptogenic organizing pneumonia (COP). Other differential considerations include both cardiogenic and noncardiogenic edema, however, this is not strongly favored. 2. Aortic atherosclerosis.   Labs: BMET Recent Labs  Lab 02/19/18 0308  02/20/18 0303 02/20/18 1603 02/21/18 0335 02/22/18 0400 02/23/18 0324 02/23/18 1331 02/24/18 1739 02/25/18 0442  NA 135   < > 137 137 138 137 137 136 135 139  K 5.6*   < > 5.4* 4.2 4.8 4.6 4.7 4.2 5.0 5.9*  CL 98*   < > 103 100* 108 105 103 101 100* 109  CO2 24  --  25  --  22 23 23 22   --  18*  GLUCOSE 337*   < > 388* 368* 286* 125* 321* 172* 289* 228*  BUN 110*   < > 80* 40* 62* 90* 62* 75* 91* 107*  CREATININE 7.45*   < > 5.61* 3.10* 4.35* 5.45* 4.47* 4.74* 6.50* 7.09*  CALCIUM 7.7*  --  8.2*  --  8.1* 8.2* 7.6* 7.8*  --  7.9*  PHOS  --   --   --   --  6.1*  --  6.9* 5.4*  --   --    < > = values in this interval not displayed.   CBC Recent Labs  Lab 02/22/18 0400 02/23/18 0324 02/24/18 0257 02/24/18 1739 02/25/18 0442  WBC 17.9* 16.5* 15.9*  --  11.7*  NEUTROABS  --   --  11.5*  --   --   HGB 9.8* 9.6* 10.4* 9.9* 9.4*  HCT 29.9* 29.3* 32.3* 29.0* 29.0*  MCV 79.1 80.1 80.3  --  79.7  PLT 142* 110* 105*  --  99*    Medications:    . Chlorhexidine Gluconate Cloth  6 each Topical Q0600  . cyclophosphamide  100 mg Oral Daily  . famotidine  20 mg Oral Daily  . insulin aspart  0-20 Units Subcutaneous TID WC  . insulin aspart  0-5 Units Subcutaneous QHS  . insulin aspart  3 Units Subcutaneous TID WC  . insulin glargine  15 Units Subcutaneous Daily  . multivitamin with minerals  1 tablet Oral Daily  . predniSONE  60 mg Oral Q breakfast  . sodium chloride flush  3 mL Intravenous Q12H   . sulfamethoxazole-trimethoprim  1 tablet Oral Once per day on Mon Wed Fri  . thiamine  100 mg Oral Daily      Madelon Lips, MD 02/25/2018, 9:37 AM

## 2018-02-25 NOTE — Progress Notes (Signed)
Physical Therapy Treatment Patient Details Name: Rhonda Lynch MRN: [id number] DOB: 11-Nov-1955 Today's Date: 02/25/2018    History of Present Illness Pt is a 62 yo female presenting with epigastric pain and was tachypneic. PMH: cocaine and alcohol abuse. Pt found to have PNA, microctic anemia, renal failurs and HTN urgency.    PT Comments    Pt initially reluctant to participate in therapy, however agreed to work on balance activities at sink. Balance exercises included single leg stance, Rhomberg, sharpened rhomberg, and tandem stance. After exercise, pt agreed to ambulation with SPC. Pt educated on proper sequencing and utilized in both UE however ultimately state she didn't think she would use it. Will continue to work on balance to improve stability in gait.     Follow Up Recommendations  Supervision/Assistance - 24 hour;Home health PT     Equipment Recommendations  None recommended by PT    Recommendations for Other Services       Precautions / Restrictions Precautions Precautions: Fall Precaution Comments: watch O2 Restrictions Weight Bearing Restrictions: No    Mobility  Bed Mobility Overal bed mobility: Modified Independent                Transfers Overall transfer level: Needs assistance   Transfers: Sit to/from Stand Sit to Stand: Supervision         General transfer comment: supervision for safety  Ambulation/Gait Ambulation/Gait assistance: Min guard Gait Distance (Feet): 250 Feet Assistive device: Straight cane Gait Pattern/deviations: Step-through pattern;Decreased step length - right;Decreased step length - left;Decreased stride length Gait velocity: slow Gait velocity interpretation: >2.62 ft/sec, indicative of community ambulatory General Gait Details: pt initially reluctant to ambulating, however after balance exercise agreed to walk, utilized [facility] and educated on sequencing, for reciprocal gait, pt tried cane on both sides, pt gait with  minor improvement although ultimately stated she could not see her self using a cane        Balance     Sitting balance-Leahy Scale: Good       Standing balance-Leahy Scale: Fair   Single Leg Stance - Right Leg: 3 Single Leg Stance - Left Leg: 5 Tandem Stance - Right Leg: 20 Tandem Stance - Left Leg: 25 Rhomberg - Eyes Opened: 30 Rhomberg - Eyes Closed: 30                Cognition Arousal/Alertness: Awake/alert Behavior During Therapy: WFL for tasks assessed/performed Overall Cognitive Status: Within Functional Limits for tasks assessed                                           General Comments General comments (skin integrity, edema, etc.): ambulated on RA with SaO2 greater than 90% throughout session      Pertinent Vitals/Pain Pain Assessment: Faces Faces Pain Scale: Hurts a little bit Pain Location: R UE pain  Pain Descriptors / Indicators: Sore Pain Intervention(s): Limited activity within patient's tolerance;Monitored during session           PT Goals (current goals can now be found in the care plan section) Acute Rehab PT Goals Patient Stated Goal: get better PT Goal Formulation: With patient Time For Goal Achievement: 03/01/18 Potential to Achieve Goals: Good    Frequency    Min 3X/week      PT Plan Current plan remains appropriate       AM-PAC PT "6 Clicks" Daily  Activity  Outcome Measure  Difficulty turning over in bed (including adjusting bedclothes, sheets and blankets)?: None Difficulty moving from lying on back to sitting on the side of the bed? : None Difficulty sitting down on and standing up from a chair with arms (e.g., wheelchair, bedside commode, etc,.)?: None Help needed moving to and from a bed to chair (including a wheelchair)?: None Help needed walking in hospital room?: A Little Help needed climbing 3-5 steps with a railing? : A Little 6 Click Score: 22    End of Session Equipment Utilized During  Treatment: Oxygen(had the tank, just in case, but didn't need it) Activity Tolerance: Patient tolerated treatment well Patient left: in bed;with call bell/phone within reach(sitting EOB) Nurse Communication: Mobility status PT Visit Diagnosis: Unsteadiness on feet (R26.81);Difficulty in walking, not elsewhere classified (R26.2)     Time: 1761-6073 PT Time Calculation (min) (ACUTE ONLY): 15 min  Charges:  $Therapeutic Exercise: 8-22 mins                    G Codes:       Mettie Roylance B. Migdalia Dk PT, DPT Acute Rehabilitation  [phone] Pager [phone]     [place] 02/25/2018, 3:33 PM

## 2018-02-25 NOTE — Procedures (Signed)
Patient seen and examined on Hemodialysis. QB 400 mL /min via temp HD cath, UF goal 2L.  Tolerating rx well.  Treatment adjusted as needed.  Madelon Lips MD [facility] pgr [phone] 9:44 AM

## 2018-02-25 NOTE — Progress Notes (Signed)
Explained to patient about NPO status for HD cath replacement. Patient became verbally aggressive and teary, stating that she wanted to eat. Charge RN notified at arrived at bedside. Charge RN explained NPO status to patient but patient still refused to be NPO.

## 2018-02-25 NOTE — Progress Notes (Signed)
pt refuse HD cath replacement at this time due to wanting foods, MD Ebbie Latus and [place] notified. Patient will be given diet order at this time per Jeneen Rinks and Hollie Salk with resume NPO when appropriate for possible HD Cath replacement on 6/19 if IR can schedule.

## 2018-02-25 NOTE — Progress Notes (Signed)
Preliminary notes--Bilateral Upper Extremity Vein Map completed.    Left Basilic vein at [facility] fossa thrombosed.  Other veins are patent.      Rhonda Lynch (RDMS RVT) 02/25/18 3:11 PM

## 2018-02-25 NOTE — H&P (Signed)
Chief Complaint: Renal failure requiring hemodialysis  Referring Physician(s): Madelon Lips  Supervising Physician: Daryll Brod  Patient Status: [facility] - In-pt  History of Present Illness: Rhonda Lynch is a 62 y.o. female who is known to our service.  She had a renal biopsy by Dr. Kathlene Cote on 6/10/219.  She had a temporary hemodialysis catheter place by Dr. Laurence Ferrari on 02/18/2018.  She has a medical history significant forcocaine and alcohol abuse.  She presented to the emergency department on 02/12/2018 with abdominal pain and was found to be in renal failure.  We are asked to exchange the temporary catheter for a tunneled hemodialysis catheter.   History reviewed. No pertinent past medical history.  Past Surgical History:  Procedure Laterality Date  . IR FLUORO GUIDE CV LINE RIGHT  02/18/2018  . IR US GUIDE VASC ACCESS RIGHT  02/18/2018    Allergies: Patient has no known allergies.  Medications: Prior to Admission medications   Medication Sig Start Date End Date Taking? Authorizing Provider  acetaminophen (TYLENOL) 325 MG tablet Take 325-650 mg by mouth every 6 (six) hours as needed (for pain or headaches).   Yes [provider]  bismuth subsalicylate (PEPTO BISMOL) 262 MG/15ML suspension Take 30 mLs by mouth every 6 (six) hours as needed for indigestion or diarrhea or loose stools.   Yes [provider]  ibuprofen (ADVIL,MOTRIN) 200 MG tablet Take 200-400 mg by mouth every 6 (six) hours as needed (for pain or headaches).   Yes [provider]  Pseudoeph-Doxylamine-DM-APAP (NYQUIL MULTI-SYMPTOM PO) Take 20-30 mLs by mouth every 6 (six) hours as needed (for symptoms).   Yes [provider]     Family History  Problem Relation Age of Onset  . Autoimmune disease Neg Hx     Social History   Socioeconomic History  . Marital status: Married    Spouse name: Not on file  . Number of children: Not on file  . Years of  education: Not on file  . Highest education level: Not on file  Occupational History  . Not on file  Social Needs  . Financial resource strain: Not on file  . Food insecurity:    Worry: Not on file    Inability: Not on file  . Transportation needs:    Medical: Not on file    Non-medical: Not on file  Tobacco Use  . Smoking status: Current Every Day Smoker    Packs/day: 1.00  . Smokeless tobacco: Never Used  Substance and Sexual Activity  . Alcohol use: Yes  . Drug use: Not Currently    Types: Cocaine  . Sexual activity: Not Currently    Birth control/protection: None  Lifestyle  . Physical activity:    Days per week: Not on file    Minutes per session: Not on file  . Stress: Not on file  Relationships  . Social connections:    Talks on phone: Not on file    Gets together: Not on file    Attends religious service: Not on file    Active member of club or organization: Not on file    Attends meetings of clubs or organizations: Not on file    Relationship status: Not on file  Other Topics Concern  . Not on file  Social History Narrative  . Not on file     Review of Systems: A 12 point ROS discussed and pertinent positives are indicated in the HPI above.  All other systems are  negative. Review of Systems  Vital Signs: BP 126/67   Pulse 74   Temp 98.4 F (36.9 C) (Oral)   Resp 14   Ht 5\' 1"  (1.549 m)   Wt 170 lb 6.7 oz (77.3 kg)   SpO2 100%   BMI 32.20 kg/m   Physical Exam  Constitutional: She is oriented to person, place, and time. She appears well-developed.  HENT:  Head: Normocephalic and atraumatic.  Eyes: EOM are normal.  Neck: Normal range of motion.  Cardiovascular: Normal rate, regular rhythm and normal heart sounds.  Pulmonary/Chest: Effort normal.  Abdominal: Soft.  Musculoskeletal: Normal range of motion.  Neurological: She is alert and oriented to person, place, and time.  Skin: Skin is warm and dry.  Psychiatric: She has a normal mood and  affect. Her behavior is normal. Judgment and thought content normal.  Vitals reviewed.   Imaging: Ct Abdomen Pelvis Wo Contrast  Result Date: 02/12/2018 CLINICAL DATA:  Epigastric abdominal pain for the past week. EXAM: CT ABDOMEN AND PELVIS WITHOUT CONTRAST TECHNIQUE: Multidetector CT imaging of the abdomen and pelvis was performed following the standard protocol without IV contrast. COMPARISON:  CT abdomen pelvis dated February 19, 2010. FINDINGS: Lower chest: Confluent ground-glass density at the right greater than left lung bases with inter and intralobular septal thickening, consistent with crazy paving pattern. This spares the subpleural lung. Hepatobiliary: No focal liver abnormality is seen. Status post cholecystectomy. Mild central intrahepatic and common bile duct dilatation, likely related to post cholecystectomy state. Pancreas: Unremarkable. No pancreatic ductal dilatation or surrounding inflammatory changes. Spleen: Normal in size without focal abnormality. Adrenals/Urinary Tract: Adrenal glands are unremarkable. Kidneys are normal, without renal calculi, focal lesion, or hydronephrosis. Bladder is unremarkable. Stomach/Bowel: Stomach is within normal limits. Appendix appears normal. No evidence of bowel wall thickening, distention, or inflammatory changes. Vascular/Lymphatic: Aortic atherosclerosis. No enlarged abdominal or pelvic lymph nodes. Reproductive: Prior hysterectomy. 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 Other: Tiny fat containing umbilical hernia. No free fluid or pneumoperitoneum. Musculoskeletal: No acute or significant osseous findings. 3 mm anterolisthesis at L4-L5 due to severe facet arthropathy. Which IMPRESSION: 1.  No acute intra-abdominal process. 2. Confluent ground-glass density and inter and intralobular septal thickening at the right greater than left lung bases, consistent with crazy paving pattern. While this pattern is nonspecific, given the sparing of the subpleural  lung, this may be related to cryptogenic organizing pneumonia. Pulmonology consultation is recommended. 3. 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 Electronically Signed   By: Titus Dubin M.D.   On: 02/12/2018 20:01   Dg Chest 1 View  Result Date: 02/14/2018 CLINICAL DATA:  Shortness of breath EXAM: CHEST  1 VIEW COMPARISON:  02/12/2018 FINDINGS: Severe diffuse bilateral disease throughout the lungs, stable. Cardiomegaly. No visible effusions or acute bony abnormality. IMPRESSION: Severe bilateral airspace disease could reflect edema/CHF or infection. No change. Electronically Signed   By: Rolm Baptise M.D.   On: 02/14/2018 09:38   US Renal  Result Date: 02/14/2018 CLINICAL DATA:  Acute kidney injury. EXAM: RENAL / URINARY TRACT ULTRASOUND COMPLETE COMPARISON:  CT abdomen pelvis dated February 12, 2018. FINDINGS: Right Kidney: Length: 11.0 cm. Increased echogenicity. No mass or hydronephrosis visualized. Left Kidney: Length: 10.9 cm. Increased echogenicity. No mass or hydronephrosis visualized. Bladder: Decompressed by a Foley catheter. IMPRESSION: Increased renal cortical echogenicity bilaterally, consistent with medical renal disease. Electronically Signed   By: Titus Dubin M.D.   On: 02/14/2018 22:34   Ct Chest High  Resolution  Result Date: 02/18/2018 CLINICAL DATA:  62 year old female with history of alcohol and cocaine abuse presenting with dyspnea and productive cough. Abnormal chest x-ray. EXAM: CT CHEST WITHOUT CONTRAST TECHNIQUE: Multidetector CT imaging of the chest was performed following the standard protocol without intravenous contrast. High resolution imaging of the lungs, as well as inspiratory and expiratory imaging, was performed. COMPARISON:  No prior chest CT.  Chest x-ray 02/14/2018. FINDINGS: Cardiovascular: Heart size is mildly enlarged. There is no significant pericardial fluid, thickening or pericardial calcification. Aortic atherosclerosis. No definite coronary artery  calcifications. Mediastinum/Nodes: No pathologically enlarged mediastinal or hilar lymph nodes. Please note that accurate exclusion of hilar adenopathy is limited on noncontrast CT scans. Esophagus is unremarkable in appearance. No axillary lymphadenopathy. Lungs/Pleura: High-resolution images demonstrate widespread areas of ground-glass attenuation and septal thickening, as well as widespread areas of peripheral bronchiolectasis. Subpleural sparing is noted, particularly throughout the mid to lower lungs. These findings have no definitive craniocaudal gradient, but appear slightly more pronounced throughout the mid to upper lungs than the lower lungs. No honeycombing. Inspiratory and expiratory imaging is unremarkable. Linear area of scarring or atelectasis in the periphery of the left lower lobe. Trace left pleural effusion lying dependently. Upper Abdomen: Unremarkable. Musculoskeletal: There are no aggressive appearing lytic or blastic lesions noted in the visualized portions of the skeleton. IMPRESSION: 1. The appearance of the lungs is nonspecific, but there are imaging findings that are suggestive of cryptogenic organizing pneumonia (COP). Other differential considerations include both cardiogenic and noncardiogenic edema, however, this is not strongly favored. Evaluation by Pulmonology is strongly recommended. 2. Aortic atherosclerosis. Aortic Atherosclerosis (ICD10-I70.0). Electronically Signed   By: Vinnie Langton M.D.   On: 02/18/2018 08:37   Ir Fluoro Guide Cv Line Right  Result Date: 02/18/2018 INDICATION: 62 year old female with renal failure in need of hemodialysis EXAM: IR RIGHT FLOURO GUIDE CV LINE; IR ULTRASOUND GUIDANCE VASC ACCESS RIGHT MEDICATIONS: None ANESTHESIA/SEDATION: None FLUOROSCOPY TIME:  Fluoroscopy Time: 0 minutes 6 seconds (0 mGy). COMPLICATIONS: None immediate. PROCEDURE: Informed written consent was obtained from the patient after a thorough discussion of the procedural  risks, benefits and alternatives. All questions were addressed. Maximal Sterile Barrier Technique was utilized including caps, mask, sterile gowns, sterile gloves, sterile drape, hand hygiene and skin antiseptic. A timeout was performed prior to the initiation of the procedure. The right internal jugular vein was interrogated with ultrasound and found to be widely patent. An image was obtained and stored for the medical record. Local anesthesia was attained by infiltration with 1% lidocaine. A small dermatotomy was made. Under real-time sonographic guidance, the vessel was punctured with a 21 gauge micropuncture needle. Using standard technique, the initial micro needle was exchanged over a 0.018 micro wire for a transitional 4 [place] micro sheath. The micro sheath was then exchanged over a 0.035 wire for a dilator which was used to dilate the soft tissue tract. A 20 cm 3 lumen non tunneled dialysis catheter was then advanced over the wire and position with the tip in the upper right atrium. The catheter flushed and aspirated with ease. The catheter was flushed with heparinized saline and secured to the skin with 0 Prolene suture. The patient tolerated the procedure well. IMPRESSION: Successful placement of a non tunneled Trialysis catheter via the right internal jugular vein. The catheter tip is in the upper right atrium and ready for immediate use. Signed, Criselda Peaches, MD [facility] Radiology Electronically Signed   By: Myrle Sheng  Laurence Ferrari M.D.   On: 02/18/2018 16:08   Ir US Guide Vasc Access Right  Result Date: 02/18/2018 INDICATION: 62 year old female with renal failure in need of hemodialysis EXAM: IR RIGHT FLOURO GUIDE CV LINE; IR ULTRASOUND GUIDANCE VASC ACCESS RIGHT MEDICATIONS: None ANESTHESIA/SEDATION: None FLUOROSCOPY TIME:  Fluoroscopy Time: 0 minutes 6 seconds (0 mGy). COMPLICATIONS: None immediate. PROCEDURE: Informed written consent was  obtained from the patient after a thorough discussion of the procedural risks, benefits and alternatives. All questions were addressed. Maximal Sterile Barrier Technique was utilized including caps, mask, sterile gowns, sterile gloves, sterile drape, hand hygiene and skin antiseptic. A timeout was performed prior to the initiation of the procedure. The right internal jugular vein was interrogated with ultrasound and found to be widely patent. An image was obtained and stored for the medical record. Local anesthesia was attained by infiltration with 1% lidocaine. A small dermatotomy was made. Under real-time sonographic guidance, the vessel was punctured with a 21 gauge micropuncture needle. Using standard technique, the initial micro needle was exchanged over a 0.018 micro wire for a transitional 4 [place] micro sheath. The micro sheath was then exchanged over a 0.035 wire for a dilator which was used to dilate the soft tissue tract. A 20 cm 3 lumen non tunneled dialysis catheter was then advanced over the wire and position with the tip in the upper right atrium. The catheter flushed and aspirated with ease. The catheter was flushed with heparinized saline and secured to the skin with 0 Prolene suture. The patient tolerated the procedure well. IMPRESSION: Successful placement of a non tunneled Trialysis catheter via the right internal jugular vein. The catheter tip is in the upper right atrium and ready for immediate use. Signed, Criselda Peaches, MD [facility] Radiology Electronically Signed   By: Jacqulynn Cadet M.D.   On: 02/18/2018 16:08   Dg Chest Port 1 View  Result Date: 02/12/2018 CLINICAL DATA:  Hypoxia. EXAM: PORTABLE CHEST 1 VIEW COMPARISON:  Chest x-ray dated December 10, 2011. FINDINGS: The heart size and mediastinal contours are within normal limits. Normal pulmonary vascularity. Confluent airspace disease involving the right central lung and left  perihilar lung. No pleural effusion or pneumothorax. No acute osseous abnormality. IMPRESSION: 1. Confluent airspace disease in the right greater than left lungs. Electronically Signed   By: Titus Dubin M.D.   On: 02/12/2018 18:53   US Biopsy (kidney)  Result Date: 02/17/2018 CLINICAL DATA:  Acute kidney injury and microscopic hematuria. Need for renal biopsy. EXAM: ULTRASOUND GUIDED CORE BIOPSY OF LEFT KIDNEY MEDICATIONS: 1.5 mg IV Versed; 25 mcg IV Fentanyl Total Moderate Sedation Time: 17 minutes. [name] level of consciousness and physiologic status were continuously monitored during the procedure by Radiology nursing. The patient was also given 20 mg of IV hydralazine at the beginning of the procedure and prior to needle advancement to treat hypertension. PROCEDURE: The procedure, risks, benefits, and alternatives were explained to the patient. Questions regarding the procedure were encouraged and answered. The patient understands and consents to the procedure. A time out was performed prior to initiating the procedure. Ultrasound was utilized to image both kidneys. The left flank region was prepped with chlorhexidine in a sterile fashion, and a sterile drape was applied covering the operative field. A sterile gown and sterile gloves were used for the procedure. Local anesthesia was provided with 1% Lidocaine. Core biopsy was performed at the level of lower pole cortex of the left kidney with a 16  gauge needle device. Two separate core biopsy samples were obtained and submitted in saline. Additional ultrasound was performed. COMPLICATIONS: None. FINDINGS: Both kidneys were well visualized by ultrasound. The left kidney was chosen for biopsy. Solid tissue was obtained from the level of the lower pole cortex. Ultrasound shows no evidence of immediate bleeding complication. IMPRESSION: Ultrasound-guided core biopsy performed of the left kidney at the level of lower pole cortex. Electronically Signed    By: Aletta Edouard M.D.   On: 02/17/2018 13:07    Labs:  CBC: Recent Labs    02/22/18 0400 02/23/18 0324 02/24/18 0257 02/24/18 1739 02/25/18 0442  WBC 17.9* 16.5* 15.9*  --  11.7*  HGB 9.8* 9.6* 10.4* 9.9* 9.4*  HCT 29.9* 29.3* 32.3* 29.0* 29.0*  PLT 142* 110* 105*  --  99*    COAGS: Recent Labs    02/12/18 1947  INR 1.07    BMP: Recent Labs    02/22/18 0400 02/23/18 0324 02/23/18 1331 02/24/18 1739 02/25/18 0442  NA 137 137 136 135 139  K 4.6 4.7 4.2 5.0 5.9*  CL 105 103 101 100* 109  CO2 23 23 22   --  18*  GLUCOSE 125* 321* 172* 289* 228*  BUN 90* 62* 75* 91* 107*  CALCIUM 8.2* 7.6* 7.8*  --  7.9*  CREATININE 5.45* 4.47* 4.74* 6.50* 7.09*  GFRNONAA 7* 10* 9*  --  5*  GFRAA 9* 11* 10*  --  6*    LIVER FUNCTION TESTS: Recent Labs    02/12/18 1534 02/17/18 0232 02/21/18 0335 02/23/18 0324 02/23/18 1331  BILITOT 0.9  --   --   --   --   AST 21  --   --   --   --   ALT 11*  --   --   --   --   ALKPHOS 53  --   --   --   --   PROT 7.7  --   --   --   --   ALBUMIN 2.3* 2.0* 3.3* 3.4* 3.6    TUMOR MARKERS: No results for input(s): AFPTM, CEA, CA199, CHROMGRNA in the last 8760 hours.  Assessment and Plan:  Renal failure requiring hemodialysis.  Will exchange temporary catheter for a tunneled catheter today.  Risks and benefits discussed with the patient including, but not limited to bleeding, infection, vascular injury, pneumothorax which may require chest tube placement, air embolism or even death  All of the patient's questions were answered, patient is agreeable to proceed. Consent signed and in chart.  Thank you for this interesting consult.  I greatly enjoyed meeting Rhonda Lynch and look forward to participating in their care.  A copy of this report was sent to the requesting provider on this date.  Electronically Signed: Murrell Redden, PA-C   02/25/2018, 9:35 AM      I spent a total of 20 Minutes in face to face in clinical  consultation, greater than 50% of which was counseling/coordinating care for tunneled HD catheter.

## 2018-02-25 NOTE — Progress Notes (Signed)
PROGRESS NOTE    Rhonda Lynch  BWG:[id number] DOB: 10-10-1955 DOA: 02/12/2018 PCP: Patient, No Pcp Per   Brief Narrative:  HPI On 02/12/2018 by Dr. Christia Reading Opyd Rhonda Lynch is a 62 y.o. female with medical history significant for cocaine and alcohol abuse, having rarely seen a physician in the last decade, now presenting to the emergency department for evaluation abdominal pain.  Patient reports 1 week of stabbing pain in the epigastrium and called EMS.  She reports associated nausea without vomiting, and upon direct questioning acknowledges recent shortness of breath and productive cough for the past 1 to 2 weeks.  She reports only minimal alcohol use though family notes she is a heavy daily drinker.  Patient reports that she has not smoked crack in years.  She denies fevers, chills, or chest pain.  Reports that her urine has become darker recently.  Reports occasional mild headache without change in vision or hearing or focal numbness or weakness.  She denies melena or hematochezia.  Interim history Admitted for possible sepsis secondary to pneumonia complicated by acute hypoxic respiratory failure and acute kidney injury.  Assessment & Plan   Sepsis secondary to Streptococcus Bilateral pneumonia/Multilobar with cryptogenic organizing pneumonia -Sepsis resolved -Chest x-ray on admission showed confluent airspace disease, R > L -Placed on azithromycin and ceftriaxone, course completed -CT chest showed possible findings suggestive of cryptogenic organizing pneumonia -Patient also found to have vasculitis, suspect this is also affecting her pulmonary system -Pulmonology consulted and appreciated: Recommended steroids, supplemental oxygen, 7 days of Rocephin for strep positive urine antigen -Pulm recommendations: Continue Solu-Medrol, 250 mg IV every 6 through 02/20/2018.  On 02/21/2018, will change to prednisone 40 mg daily -Patient will also need a pulmonology outpatient  follow-up -Nephrology started prednisone 60mg  daily  Acute hypoxic respiratory failure -Resolved, Secondary to the above versus vasculitis -Treatment and plan as above -currently maintaining O2 saturations at 100% on room air  Acute kidney injury with hyperkalemia amd /rapid progressive glomerulonephritis/MPO ANCA vasculitis -Nephrology consulted and appreciated -Creatinine on admission 7.58 -Status post kidney biopsy on 02/17/2018 -Started hemodialysis and therapeutic plasma exchange (TPE) -Creatinine currently 7.09 -Cytoxan as well as steroids -Noted on Bactrim on Monday Wednesday Friday -continue to monitor BMP -Discussed with Dr. Joelyn Oms, felt patient would need to be on prednisone 60mg  for at least the next month and will have a prolonged hospital course and will need tunneled cath. Feels patient should remained hospitalized for the entirety of TPE  Hyperglycemia -No history of diabetes mellitus -Secondary to steroids -Continue lantus, ISS and CBG monitoring  Non-anion gap metabolic acidosis -resolved   Anemia of chronic disease/anemia of iron deficiency -Hemoglobin as low as 6.7 -Received 2 units PRBC IV iron -Hemoglobin currently 9.4 -Continue to monitor CBC  Alcohol and cocaine abuse -Counseled -Currently not having withdrawal symptoms  Alcohol induced gastritis -Continue famotidine  Physical deconditioning -PT evaluated patient and recommended home health PT, 24-hour supervision. -Case management consulted  Acute metabolic encephalopathy -Resolved, currently AAOx3 -likely secondary to steroids -given one dose of ativan -continue to monitor closely   Hepatitis C  -Ab positive- can follow up as an oupatient  DVT Prophylaxis  SCDs  Code Status: Full  Family Communication: None at bedside  Disposition Plan: Admitted. Dispo pending nephrology consultations  Consultants Pulmonology Nephrology  Procedures  Renal ultrasound Ultrasound-guided core  biopsy of left kidney  Antibiotics   Anti-infectives (From admission, onward)   Start     Dose/Rate Route Frequency Ordered Stop  02/21/18 0900  sulfamethoxazole-trimethoprim (BACTRIM DS,SEPTRA DS) 800-160 MG per tablet 1 tablet     1 tablet Oral Once per day on Mon Wed Fri 02/20/18 1116     02/13/18 1900  cefTRIAXone (ROCEPHIN) 1 g in sodium chloride 0.9 % 100 mL IVPB     1 g 200 mL/hr over 30 Minutes Intravenous Every 24 hours 02/12/18 2122 02/18/18 2014   02/13/18 1900  azithromycin (ZITHROMAX) 500 mg in sodium chloride 0.9 % 250 mL IVPB  Status:  Discontinued     500 mg 250 mL/hr over 60 Minutes Intravenous Every 24 hours 02/12/18 2122 02/14/18 0819   02/12/18 1915  cefTRIAXone (ROCEPHIN) 2 g in sodium chloride 0.9 % 100 mL IVPB  Status:  Discontinued     2 g 200 mL/hr over 30 Minutes Intravenous Every 24 hours 02/12/18 1901 02/12/18 2125   02/12/18 1915  azithromycin (ZITHROMAX) 500 mg in sodium chloride 0.9 % 250 mL IVPB  Status:  Discontinued     500 mg 250 mL/hr over 60 Minutes Intravenous Every 24 hours 02/12/18 1901 02/12/18 2125      Subjective:   Rhonda Lynch seen and examined today in hemodialysis. Feels hungry this morning. Has no complaints. Denies chest pain, shortness of breath, abdominal pain, N/V/D/C.  Objective:   Vitals:   02/25/18 0830 02/25/18 0900 02/25/18 0930 02/25/18 1000  BP: (!) 140/48 138/70 126/67 131/68  Pulse: 78 71 74 73  Resp: 16 14    Temp:      TempSrc:      SpO2:      Weight:      Height:        Intake/Output Summary (Last 24 hours) at 02/25/2018 1019 Last data filed at 02/25/2018 0300 Gross per 24 hour  Intake 179.75 ml  Output 300 ml  Net -120.25 ml   Filed Weights   02/23/18 0500 02/24/18 0500 02/25/18 0631  Weight: 75.2 kg (165 lb 12.6 oz) 77 kg (169 lb 12.1 oz) 77.3 kg (170 lb 6.7 oz)   Exam  General: Well developed, well nourished, NAD, appears stated age  77: NCAT, mucous membranes moist.   Neck:  Supple  Cardiovascular: S1 S2 auscultated, RRR, no murmurs  Respiratory: Clear to auscultation bilaterally with equal chest rise  Abdomen: Soft, nontender, nondistended, + bowel sounds  Extremities: warm dry without cyanosis clubbing or edema  Neuro: AAOx3, nonfocal  Psych: Normal affect and demeanor with intact judgement and insight  Data Reviewed: I have personally reviewed following labs and imaging studies  CBC: Recent Labs  Lab 02/21/18 0335 02/22/18 0400 02/23/18 0324 02/24/18 0257 02/24/18 1739 02/25/18 0442  WBC 12.1* 17.9* 16.5* 15.9*  --  11.7*  NEUTROABS  --   --   --  11.5*  --   --   HGB 9.8* 9.8* 9.6* 10.4* 9.9* 9.4*  HCT 30.7* 29.9* 29.3* 32.3* 29.0* 29.0*  MCV 81.2 79.1 80.1 80.3  --  79.7  PLT 179 142* 110* 105*  --  99*   Basic Metabolic Panel: Recent Labs  Lab 02/21/18 0335 02/22/18 0400 02/23/18 0324 02/23/18 1331 02/24/18 1739 02/25/18 0442  NA 138 137 137 136 135 139  K 4.8 4.6 4.7 4.2 5.0 5.9*  CL 108 105 103 101 100* 109  CO2 22 23 23 22   --  18*  GLUCOSE 286* 125* 321* 172* 289* 228*  BUN 62* 90* 62* 75* 91* 107*  CREATININE 4.35* 5.45* 4.47* 4.74* 6.50* 7.09*  CALCIUM 8.1* 8.2* 7.6*  7.8*  --  7.9*  PHOS 6.1*  --  6.9* 5.4*  --   --    GFR: Estimated Creatinine Clearance: 7.5 mL/min (A) (by C-G formula based on SCr of 7.09 mg/dL (H)). Liver Function Tests: Recent Labs  Lab 02/21/18 0335 02/23/18 0324 02/23/18 1331  ALBUMIN 3.3* 3.4* 3.6   No results for input(s): LIPASE, AMYLASE in the last 168 hours. No results for input(s): AMMONIA in the last 168 hours. Coagulation Profile: No results for input(s): INR, PROTIME in the last 168 hours. Cardiac Enzymes: No results for input(s): CKTOTAL, CKMB, CKMBINDEX, TROPONINI in the last 168 hours. BNP (last 3 results) No results for input(s): PROBNP in the last 8760 hours. HbA1C: No results for input(s): HGBA1C in the last 72 hours. CBG: Recent Labs  Lab 02/23/18 2245  02/24/18 0820 02/24/18 1215 02/24/18 1654 02/24/18 2214  GLUCAP 243* 75 128* 296* 245*   Lipid Profile: No results for input(s): CHOL, HDL, LDLCALC, TRIG, CHOLHDL, LDLDIRECT in the last 72 hours. Thyroid Function Tests: No results for input(s): TSH, T4TOTAL, FREET4, T3FREE, THYROIDAB in the last 72 hours. Anemia Panel: No results for input(s): VITAMINB12, FOLATE, FERRITIN, TIBC, IRON, RETICCTPCT in the last 72 hours. Urine analysis:    Component Value Date/Time   COLORURINE YELLOW 02/12/2018 1801   APPEARANCEUR HAZY (A) 02/12/2018 1801   LABSPEC 1.013 02/12/2018 1801   PHURINE 5.0 02/12/2018 1801   GLUCOSEU NEGATIVE 02/12/2018 1801   HGBUR LARGE (A) 02/12/2018 1801   HGBUR trace-intact 03/08/2010 1452   BILIRUBINUR NEGATIVE 02/12/2018 1801   KETONESUR NEGATIVE 02/12/2018 1801   PROTEINUR 100 (A) 02/12/2018 1801   UROBILINOGEN 0.2 03/08/2010 1452   NITRITE NEGATIVE 02/12/2018 1801   LEUKOCYTESUR NEGATIVE 02/12/2018 1801   Sepsis Labs: @LABRCNTIP (procalcitonin:4,lacticidven:4)  ) No results found for this or any previous visit (from the past 240 hour(s)).    Radiology Studies: No results found.   Scheduled Meds: . Chlorhexidine Gluconate Cloth  6 each Topical Q0600  . cyclophosphamide  100 mg Oral Daily  . famotidine  20 mg Oral Daily  . insulin aspart  0-20 Units Subcutaneous TID WC  . insulin aspart  0-5 Units Subcutaneous QHS  . insulin aspart  3 Units Subcutaneous TID WC  . insulin glargine  15 Units Subcutaneous Daily  . multivitamin with minerals  1 tablet Oral Daily  . predniSONE  60 mg Oral Q breakfast  . sodium chloride flush  3 mL Intravenous Q12H  . sulfamethoxazole-trimethoprim  1 tablet Oral Once per day on Mon Wed Fri  . thiamine  100 mg Oral Daily   Continuous Infusions: . sodium chloride    . sodium chloride    . sodium chloride    . citrate dextrose 500 mL (02/24/18 1813)     LOS: 13 days   Time Spent in minutes   30 minutes   Rjay Revolorio D.O. on 02/25/2018 at 10:19 AM  Between 7am to 7pm - Pager - [phone]  After 7pm go to [email or web address] - password [email or web address]  And look for the night coverage person covering for me after hours  [facility] Office  [phone]

## 2018-02-25 NOTE — Progress Notes (Signed)
Attempted to bring pt down for HD catheter. Per nurse pt had lunch. Will reschedule for tomorrow

## 2018-02-26 ENCOUNTER — Encounter (HOSPITAL_COMMUNITY): Payer: Self-pay | Admitting: Diagnostic Radiology

## 2018-02-26 ENCOUNTER — Inpatient Hospital Stay (HOSPITAL_COMMUNITY): Payer: Medicaid Other

## 2018-02-26 HISTORY — PX: IR FLUORO GUIDE CV LINE RIGHT: IMG2283

## 2018-02-26 LAB — GLUCOSE, CAPILLARY
GLUCOSE-CAPILLARY: 134 mg/dL — AB (ref 65–99)
GLUCOSE-CAPILLARY: 168 mg/dL — AB (ref 65–99)
Glucose-Capillary: 142 mg/dL — ABNORMAL HIGH (ref 65–99)
Glucose-Capillary: 223 mg/dL — ABNORMAL HIGH (ref 65–99)

## 2018-02-26 LAB — RENAL FUNCTION PANEL
ALBUMIN: 3.3 g/dL — AB (ref 3.5–5.0)
Anion gap: 6 (ref 5–15)
BUN: 60 mg/dL — AB (ref 6–20)
CALCIUM: 8 mg/dL — AB (ref 8.9–10.3)
CO2: 26 mmol/L (ref 22–32)
Chloride: 104 mmol/L (ref 101–111)
Creatinine, Ser: 4.65 mg/dL — ABNORMAL HIGH (ref 0.44–1.00)
GFR calc Af Amer: 11 mL/min — ABNORMAL LOW (ref >60)
GFR calc non Af Amer: 9 mL/min — ABNORMAL LOW (ref >60)
GLUCOSE: 174 mg/dL — AB (ref 65–99)
PHOSPHORUS: 4 mg/dL (ref 2.5–4.6)
POTASSIUM: 4.7 mmol/L (ref 3.5–5.1)
SODIUM: 136 mmol/L (ref 135–145)

## 2018-02-26 LAB — CBC
HEMATOCRIT: 26.5 % — AB (ref 36.0–46.0)
HEMOGLOBIN: 8.7 g/dL — AB (ref 12.0–15.0)
MCH: 25.9 pg — ABNORMAL LOW (ref 26.0–34.0)
MCHC: 32.8 g/dL (ref 30.0–36.0)
MCV: 78.9 fL (ref 78.0–100.0)
Platelets: 111 10*3/uL — ABNORMAL LOW (ref 150–400)
RBC: 3.36 MIL/uL — ABNORMAL LOW (ref 3.87–5.11)
RDW: 20.2 % — AB (ref 11.5–15.5)
WBC: 15.8 10*3/uL — ABNORMAL HIGH (ref 4.0–10.5)

## 2018-02-26 MED ORDER — HALOPERIDOL LACTATE 5 MG/ML IJ SOLN: 1.0000 mg | Freq: Four times a day (QID) | INTRAMUSCULAR | Status: DC | PRN

## 2018-02-26 MED ORDER — LIDOCAINE HCL (PF) 1 % IJ SOLN
INTRAMUSCULAR | Status: AC | PRN
  Administered 2018-02-26: 10 mL

## 2018-02-26 MED ORDER — ACD FORMULA A 0.73-2.45-2.2 GM/100ML VI SOLN
Status: AC
  Filled 2018-02-26: qty 500

## 2018-02-26 MED ORDER — ACD FORMULA A 0.73-2.45-2.2 GM/100ML VI SOLN
500.0000 mL | Status: DC
  Filled 2018-02-26 (×2): qty 500

## 2018-02-26 MED ORDER — LORAZEPAM 2 MG/ML IJ SOLN
INTRAMUSCULAR | Status: AC
  Filled 2018-02-26: qty 1

## 2018-02-26 MED ORDER — ACETAMINOPHEN 325 MG PO TABS: 650.0000 mg | ORAL_TABLET | ORAL | Status: DC | PRN

## 2018-02-26 MED ORDER — MIDAZOLAM HCL 2 MG/2ML IJ SOLN
INTRAMUSCULAR | Status: AC | PRN
  Administered 2018-02-26: 0.5 mg via INTRAVENOUS
  Administered 2018-02-26: 1 mg via INTRAVENOUS

## 2018-02-26 MED ORDER — DIPHENHYDRAMINE HCL 25 MG PO CAPS: 25.0000 mg | ORAL_CAPSULE | Freq: Four times a day (QID) | ORAL | Status: DC | PRN

## 2018-02-26 MED ORDER — LIDOCAINE HCL 1 % IJ SOLN
INTRAMUSCULAR | Status: AC
  Filled 2018-02-26: qty 20

## 2018-02-26 MED ORDER — ALBUMIN HUMAN 25 % IV SOLN
INTRAVENOUS | Status: AC
  Administered 2018-02-26 (×3): via INTRAVENOUS_CENTRAL
  Filled 2018-02-26 (×3): qty 200

## 2018-02-26 MED ORDER — MIDAZOLAM HCL 2 MG/2ML IJ SOLN
INTRAMUSCULAR | Status: AC
  Filled 2018-02-26: qty 4

## 2018-02-26 MED ORDER — FENTANYL CITRATE (PF) 100 MCG/2ML IJ SOLN
INTRAMUSCULAR | Status: AC
  Filled 2018-02-26: qty 4

## 2018-02-26 MED ORDER — SODIUM CHLORIDE 0.9 % IV SOLN
2.0000 g | Freq: Once | INTRAVENOUS | Status: AC
  Administered 2018-02-26: 2 g via INTRAVENOUS
  Filled 2018-02-26: qty 20

## 2018-02-26 MED ORDER — CEFAZOLIN SODIUM-DEXTROSE 2-4 GM/100ML-% IV SOLN: 2.0000 g | Freq: Once | INTRAVENOUS | Status: DC

## 2018-02-26 MED ORDER — HEPARIN SODIUM (PORCINE) 1000 UNIT/ML IJ SOLN
1000.0000 [IU] | Freq: Once | INTRAMUSCULAR | Status: DC
  Filled 2018-02-26: qty 1

## 2018-02-26 MED ORDER — LORAZEPAM 2 MG/ML IJ SOLN
0.5000 mg | Freq: Four times a day (QID) | INTRAMUSCULAR | Status: DC | PRN
  Administered 2018-02-26 – 2018-03-13 (×15): 0.5 mg via INTRAVENOUS
  Filled 2018-02-26 (×16): qty 1

## 2018-02-26 MED ORDER — HEPARIN SODIUM (PORCINE) 1000 UNIT/ML IJ SOLN
INTRAMUSCULAR | Status: AC
  Filled 2018-02-26: qty 1

## 2018-02-26 MED ORDER — CALCIUM CARBONATE ANTACID 500 MG PO CHEW
2.0000 | CHEWABLE_TABLET | ORAL | Status: AC
  Administered 2018-02-26: 400 mg via ORAL

## 2018-02-26 MED ORDER — FENTANYL CITRATE (PF) 100 MCG/2ML IJ SOLN
INTRAMUSCULAR | Status: AC | PRN
  Administered 2018-02-26 (×2): 50 ug via INTRAVENOUS

## 2018-02-26 MED ORDER — CHLORHEXIDINE GLUCONATE 4 % EX LIQD
CUTANEOUS | Status: AC
  Filled 2018-02-26: qty 15

## 2018-02-26 MED ORDER — CALCIUM CARBONATE ANTACID 500 MG PO CHEW
CHEWABLE_TABLET | ORAL | Status: AC
  Administered 2018-02-26: 400 mg via ORAL
  Filled 2018-02-26: qty 2

## 2018-02-26 MED ORDER — CEFAZOLIN SODIUM-DEXTROSE 2-4 GM/100ML-% IV SOLN
INTRAVENOUS | Status: AC
  Administered 2018-02-26: 2000 mg
  Filled 2018-02-26: qty 100

## 2018-02-26 NOTE — Progress Notes (Signed)
Patient is very agitated today about being NPO. She is thrashing about in bed,yelling and cussing at staff. Administered ordered PRN Ativan. Will continue to monitor.

## 2018-02-26 NOTE — Progress Notes (Signed)
[name], female   DOB: 1956/01/22, 62 y.o.   MRN: [id number]  PROGRESS NOTE    Rhonda Lynch  FBP:[id number] DOB: [birth year] DOA: 02/12/2018 PCP: Patient, No Pcp Per   Brief Narrative:  62 year old female with history of cocaine and alcohol abuse presented on 02/12/2018 with abdominal pain with nausea and vomiting along with shortness of breath and cough.  She was admitted with sepsis secondary to pneumonia with acute hypoxic respiratory failure and acute kidney injury.   Assessment & Plan:   Principal Problem:   PNEUMONIA Active Problems:   ELEVATED BLOOD PRESSURE WITHOUT DIAGNOSIS OF HYPERTENSION   Renal failure   Acute respiratory failure with hypoxia (HCC)   Alcohol abuse   Microcytic anemia   Cocaine abuse ([place])   Epigastric pain   Pneumonia   Pulmonary infiltrate   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[place])   Diffuse pulmonary alveolar hemorrhage   Hypoxemia   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CC)   Sepsis secondary to pneumonia  -Sepsis has resolved.  Currently hemodynamically stable.  Streptococcal Bilateral pneumonia/ cryptogenic organizing pneumonia -Urinary strep pneumo antigen was positive - completed antibiotic course.  Pulmonary had recommended steroids and antibiotics.  Outpatient follow-up with pulmonary.  Currently on prednisone 60 mg daily as per nephrology. -CT chest showed possible findings suggestive of cryptogenic organizing pneumonia -Patient also found to have vasculitis, suspect this is also affecting her pulmonary system - Acute hypoxic respiratory failure -Resolved -Currently on room air  Acute kidney injury with hyperkalemia/rapid progressive glomerulonephritis/MPO ANCA vasculitis -Nephrology following.  On Cytoxan and prednisone along with 3 times a week Bactrim -Status post kidney biopsy on 02/17/2018 -Started hemodialysis and therapeutic plasma exchange (TPE) -continue to monitor BMP -Prior hospitalist discussed  with Dr. Joelyn Oms, felt patient would need to be on prednisone 60mg  for at least the next month and will have a prolonged hospital course and will need tunneled cath. Feels patient should  remain hospitalized for the entirety of TPE -For tunnelled catheter by IR today  Hyperglycemia -No history of diabetes mellitus -Secondary to steroids -Continue lantus, ISS and CBG monitoring  Non-anion gap metabolic acidosis -resolved   Anemia of chronic disease/anemia of iron deficiency -Hemoglobin as low as 6.7 -Received 2 units PRBC and  IV iron -Hemoglobin stable -Continue to monitor CBC  Alcohol and cocaine abuse -Counseled -Currently not having withdrawal symptoms  Alcohol induced gastritis -Continue famotidine  Physical deconditioning -PT evaluated patient and recommended home health PT, 24-hour supervision. -Case management consulted  Acute metabolic encephalopathy -Resolved, currently AAOx3 -continue to monitor closely   Hepatitis C  -Ab positive- can follow up as an oupatient    DVT prophylaxis: SCDs Code Status: Full Family Communication: None at bedside Disposition Plan: Depends on clinical outcome  Consultants: Pulmonary/nephrology  Procedures: Ultrasound-guided core biopsy of left kidney  Antimicrobials:  Anti-infectives (From admission, onward)   Start     Dose/Rate Route Frequency Ordered Stop   02/21/18 0900  sulfamethoxazole-trimethoprim (BACTRIM DS,SEPTRA DS) 800-160 MG per tablet 1 tablet     1 tablet Oral Once per day on Mon Wed Fri 02/20/18 1116     02/13/18 1900  cefTRIAXone (ROCEPHIN) 1 g in sodium chloride 0.9 % 100 mL IVPB     1 g 200 mL/hr over 30 Minutes Intravenous Every 24 hours 02/12/18 2122 02/18/18 2014   02/13/18 1900  azithromycin (ZITHROMAX) 500 mg in sodium chloride 0.9 % 250 mL IVPB  Status:  Discontinued     500 mg 250  mL/hr over 60 Minutes Intravenous Every 24 hours 02/12/18 2122 02/14/18 0819   02/12/18 1915  cefTRIAXone  (ROCEPHIN) 2 g in sodium chloride 0.9 % 100 mL IVPB  Status:  Discontinued     2 g 200 mL/hr over 30 Minutes Intravenous Every 24 hours 02/12/18 1901 02/12/18 2125   02/12/18 1915  azithromycin (ZITHROMAX) 500 mg in sodium chloride 0.9 % 250 mL IVPB  Status:  Discontinued     500 mg 250 mL/hr over 60 Minutes Intravenous Every 24 hours 02/12/18 1901 02/12/18 2125         Subjective: Patient seen and examined at bedside.  She wakes up slightly, does not answer much questions.  Poor historian.  No overnight fever, nausea or vomiting.  Objective: Vitals:   02/25/18 2329 02/26/18 0622 02/26/18 0625 02/26/18 0738  BP: (!) 156/68  137/67 (!) 141/76  Pulse: (!) 59  (!) 58 (!) 56  Resp:    20  Temp: 98.2 F (36.8 C)   98.3 F (36.8 C)  TempSrc: Oral   Oral  SpO2: 99%   100%  Weight:  74.1 kg (163 lb 5.8 oz)    Height:        Intake/Output Summary (Last 24 hours) at 02/26/2018 1210 Last data filed at 02/25/2018 1300 Gross per 24 hour  Intake 0 ml  Output -  Net 0 ml   Filed Weights   02/25/18 0631 02/25/18 1035 02/26/18 0622  Weight: 77.3 kg (170 lb 6.7 oz) 73.1 kg (161 lb 2.5 oz) 74.1 kg (163 lb 5.8 oz)    Examination:  General exam: Appears sleepy, wakes up slightly, does not answer most questions.  No distress Respiratory system: Bilateral decreased breath sound at bases Cardiovascular system: S1 & S2 heard, rate controlled  gastrointestinal system: Abdomen is nondistended, soft and nontender. Normal bowel sounds heard. Extremities: No cyanosis, clubbing; trace edema   Data Reviewed: I have personally reviewed following labs and imaging studies  CBC: Recent Labs  Lab 02/22/18 0400 02/23/18 0324 02/24/18 0257 02/24/18 1739 02/25/18 0442 02/26/18 0432  WBC 17.9* 16.5* 15.9*  --  11.7* 15.8*  NEUTROABS  --   --  11.5*  --   --   --   HGB 9.8* 9.6* 10.4* 9.9* 9.4* 8.7*  HCT 29.9* 29.3* 32.3* 29.0* 29.0* 26.5*  MCV 79.1 80.1 80.3  --  79.7 78.9  PLT 142* 110* 105*   --  99* 409*   Basic Metabolic Panel: Recent Labs  Lab 02/21/18 0335 02/22/18 0400 02/23/18 0324 02/23/18 1331 02/24/18 1739 02/25/18 0442 02/26/18 0432  NA 138 137 137 136 135 139 136  K 4.8 4.6 4.7 4.2 5.0 5.9* 4.7  CL 108 105 103 101 100* 109 104  CO2 22 23 23 22   --  18* 26  GLUCOSE 286* 125* 321* 172* 289* 228* 174*  BUN 62* 90* 62* 75* 91* 107* 60*  CREATININE 4.35* 5.45* 4.47* 4.74* 6.50* 7.09* 4.65*  CALCIUM 8.1* 8.2* 7.6* 7.8*  --  7.9* 8.0*  PHOS 6.1*  --  6.9* 5.4*  --   --  4.0   GFR: Estimated Creatinine Clearance: 11.2 mL/min (A) (by C-G formula based on SCr of 4.65 mg/dL (H)). Liver Function Tests: Recent Labs  Lab 02/21/18 0335 02/23/18 0324 02/23/18 1331 02/26/18 0432  ALBUMIN 3.3* 3.4* 3.6 3.3*   No results for input(s): LIPASE, AMYLASE in the last 168 hours. No results for input(s): AMMONIA in the last 168 hours. Coagulation Profile: Recent  Labs  Lab 02/25/18 1559  INR 1.20   Cardiac Enzymes: No results for input(s): CKTOTAL, CKMB, CKMBINDEX, TROPONINI in the last 168 hours. BNP (last 3 results) No results for input(s): PROBNP in the last 8760 hours. HbA1C: No results for input(s): HGBA1C in the last 72 hours. CBG: Recent Labs  Lab 02/25/18 1212 02/25/18 1711 02/25/18 2103 02/26/18 0740 02/26/18 1137  GLUCAP 205* 361* 167* 142* 134*   Lipid Profile: No results for input(s): CHOL, HDL, LDLCALC, TRIG, CHOLHDL, LDLDIRECT in the last 72 hours. Thyroid Function Tests: No results for input(s): TSH, T4TOTAL, FREET4, T3FREE, THYROIDAB in the last 72 hours. Anemia Panel: No results for input(s): VITAMINB12, FOLATE, FERRITIN, TIBC, IRON, RETICCTPCT in the last 72 hours. Sepsis Labs: No results for input(s): PROCALCITON, LATICACIDVEN in the last 168 hours.  No results found for this or any previous visit (from the past 240 hour(s)).       Radiology Studies: No results found.      Scheduled Meds: . Chlorhexidine Gluconate Cloth  6  each Topical Q0600  . cyclophosphamide  100 mg Oral Daily  . famotidine  20 mg Oral Daily  . insulin aspart  0-20 Units Subcutaneous TID WC  . insulin aspart  0-5 Units Subcutaneous QHS  . insulin aspart  3 Units Subcutaneous TID WC  . insulin glargine  15 Units Subcutaneous Daily  . multivitamin with minerals  1 tablet Oral Daily  . predniSONE  60 mg Oral Q breakfast  . sodium chloride flush  3 mL Intravenous Q12H  . sulfamethoxazole-trimethoprim  1 tablet Oral Once per day on Mon Wed Fri  . thiamine  100 mg Oral Daily   Continuous Infusions: . citrate dextrose 500 mL (02/24/18 1813)     LOS: 14 days        Aline August, MD [facility] Pager [phone]  If 7PM-7AM, please contact night-coverage [email or web address] Password [email or web address] 02/26/2018, 12:10 PM

## 2018-02-26 NOTE — Procedures (Addendum)
Right jugular cath was converted to tunneled dialysis catheter.  Tip at SVC/RA junction.  Minimal blood loss and no immediate complication.  Remove neck sutures in 10-14 days.

## 2018-02-26 NOTE — Progress Notes (Signed)
Rhonda Lynch    Assessment/ Plan:   1. 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 Of pulmonary hemorrhage vs BOOP (pulm has seen) 1. Started TPE, 1PV x7, Albumin on 6/12, 6/13, 6/15, 6/17, next for today 6/19 2. Started HD on 6/12, 6/13, 6/15, 6/18.  Next HD 6/20. 3. Solumedrol Pulse started 6/11-6/13, now pred 60mg /d 4. Started cyclophosphamide 100mg /d 02/18/18, monitor for leukopenia- no dosage adjustments required as of yet 5. TMP/SMX DS for PCP prevention start 02/20/18 6. Has TEMP HD cath- IR contacted for [facility] placement, will be done today 6/19, appreciate assistance.  Will do vein mapping in case needs fistula- there are no signs of renal recovery yet, and I suspect she will need dialysis for the forseeable future.    2. Cocaine use 6/5 UDS positive for cocaine 3. S pneumoniae PNA: has finished a course of CTX  4. 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  Rec OP followup, no bronch deemed necessary at this time 5. HCV Ab Positive -- can f/u as outpt for rx (possible contribution to fibrillary GN so will be important) 6. Dispo: pending finishing TPE and determining if pt will need dialysis as OP (I suspect she will based on lack of evidence of renal recovery thus far)   Subjective:    Refused TDC yesterday because she didn't want to be NPO; angry at NPO status this AM too.  Discussed importance with pt.       Objective:   BP (!) 141/76 (BP Location: Right Arm)   Pulse (!) 56   Temp 98.3 F (36.8 C) (Oral)   Resp 20   Ht 5\' 1"  (1.549 m)   Wt 74.1 kg (163 lb 5.8 oz)   SpO2 100%   BMI 30.87 kg/m   Intake/Output Summary (Last 24 hours) at 02/26/2018 1058 Last data filed at 02/25/2018 1300 Gross per 24 hour  Intake 0 ml  Output -  Net 0 ml   Weight change: -4.2 kg (-9 lb 4.2 oz)  Physical Exam: GEN: upset, sitting on edge of bed CV: RRR PULM: CTAB EXT: trace edema noted No  rashes/noted R IJ Temp HD cath present, some serous fluid draining.    Imaging/ Studies: Renal Bx 6/10: active pauci immune GN with 70% cellular crescents and very little chronicity / fibrosing; EM with Fibrillary GN as well  HR Chest CT 6/10: IMPRESSION: 1. The appearance of the lungs is nonspecific, but there are imaging findings that are suggestive of cryptogenic organizing pneumonia (COP). Other differential considerations include both cardiogenic and noncardiogenic edema, however, this is not strongly favored. 2. Aortic atherosclerosis.   Labs: BMET Recent Labs  Lab 02/20/18 0303  02/21/18 0335 02/22/18 0400 02/23/18 0324 02/23/18 1331 02/24/18 1739 02/25/18 0442 02/26/18 0432  NA 137   < > 138 137 137 136 135 139 136  K 5.4*   < > 4.8 4.6 4.7 4.2 5.0 5.9* 4.7  CL 103   < > 108 105 103 101 100* 109 104  CO2 25  --  22 23 23 22   --  18* 26  GLUCOSE 388*   < > 286* 125* 321* 172* 289* 228* 174*  BUN 80*   < > 62* 90* 62* 75* 91* 107* 60*  CREATININE 5.61*   < > 4.35* 5.45* 4.47* 4.74* 6.50* 7.09* 4.65*  CALCIUM 8.2*  --  8.1* 8.2* 7.6* 7.8*  --  7.9* 8.0*  PHOS  --   --  6.1*  --  6.9* 5.4*  --   --  4.0   < > = values in this interval not displayed.   CBC Recent Labs  Lab 02/23/18 0324 02/24/18 0257 02/24/18 1739 02/25/18 0442 02/26/18 0432  WBC 16.5* 15.9*  --  11.7* 15.8*  NEUTROABS  --  11.5*  --   --   --   HGB 9.6* 10.4* 9.9* 9.4* 8.7*  HCT 29.3* 32.3* 29.0* 29.0* 26.5*  MCV 80.1 80.3  --  79.7 78.9  PLT 110* 105*  --  99* 111*    Medications:    . Chlorhexidine Gluconate Cloth  6 each Topical Q0600  . cyclophosphamide  100 mg Oral Daily  . famotidine  20 mg Oral Daily  . insulin aspart  0-20 Units Subcutaneous TID WC  . insulin aspart  0-5 Units Subcutaneous QHS  . insulin aspart  3 Units Subcutaneous TID WC  . insulin glargine  15 Units Subcutaneous Daily  . multivitamin with minerals  1 tablet Oral Daily  . predniSONE  60 mg Oral Q breakfast   . sodium chloride flush  3 mL Intravenous Q12H  . sulfamethoxazole-trimethoprim  1 tablet Oral Once per day on Mon Wed Fri  . thiamine  100 mg Oral Daily      Madelon Lips, MD 02/26/2018, 10:58 AM

## 2018-02-27 ENCOUNTER — Inpatient Hospital Stay: Payer: Self-pay | Admitting: Adult Health

## 2018-02-27 LAB — CBC WITH DIFFERENTIAL/PLATELET
Abs Immature Granulocytes: 0.1 10*3/uL (ref 0.0–0.1)
BASOS PCT: 0 %
Basophils Absolute: 0 10*3/uL (ref 0.0–0.1)
EOS ABS: 0.1 10*3/uL (ref 0.0–0.7)
Eosinophils Relative: 1 %
HCT: 25.6 % — ABNORMAL LOW (ref 36.0–46.0)
Hemoglobin: 8.4 g/dL — ABNORMAL LOW (ref 12.0–15.0)
IMMATURE GRANULOCYTES: 1 %
Lymphocytes Relative: 17 %
Lymphs Abs: 2.3 10*3/uL (ref 0.7–4.0)
MCH: 26.2 pg (ref 26.0–34.0)
MCHC: 32.8 g/dL (ref 30.0–36.0)
MCV: 79.8 fL (ref 78.0–100.0)
Monocytes Absolute: 1.1 10*3/uL — ABNORMAL HIGH (ref 0.1–1.0)
Monocytes Relative: 8 %
NEUTROS PCT: 73 %
Neutro Abs: 9.7 10*3/uL — ABNORMAL HIGH (ref 1.7–7.7)
PLATELETS: 131 10*3/uL — AB (ref 150–400)
RBC: 3.21 MIL/uL — AB (ref 3.87–5.11)
RDW: 20.6 % — ABNORMAL HIGH (ref 11.5–15.5)
WBC: 13.2 10*3/uL — AB (ref 4.0–10.5)

## 2018-02-27 LAB — BASIC METABOLIC PANEL
ANION GAP: 10 (ref 5–15)
BUN: 76 mg/dL — ABNORMAL HIGH (ref 6–20)
CALCIUM: 7.6 mg/dL — AB (ref 8.9–10.3)
CO2: 24 mmol/L (ref 22–32)
Chloride: 109 mmol/L (ref 101–111)
Creatinine, Ser: 5.8 mg/dL — ABNORMAL HIGH (ref 0.44–1.00)
GFR, EST AFRICAN AMERICAN: 8 mL/min — AB (ref >60)
GFR, EST NON AFRICAN AMERICAN: 7 mL/min — AB (ref >60)
Glucose, Bld: 187 mg/dL — ABNORMAL HIGH (ref 65–99)
Potassium: 4 mmol/L (ref 3.5–5.1)
SODIUM: 143 mmol/L (ref 135–145)

## 2018-02-27 LAB — GLUCOSE, CAPILLARY
GLUCOSE-CAPILLARY: 247 mg/dL — AB (ref 65–99)
GLUCOSE-CAPILLARY: 296 mg/dL — AB (ref 65–99)
Glucose-Capillary: 87 mg/dL (ref 65–99)

## 2018-02-27 LAB — POCT I-STAT, CHEM 8
BUN: 59 mg/dL — AB (ref 6–20)
CHLORIDE: 98 mmol/L — AB (ref 101–111)
Calcium, Ion: 1.17 mmol/L (ref 1.15–1.40)
Creatinine, Ser: 4.8 mg/dL — ABNORMAL HIGH (ref 0.44–1.00)
Glucose, Bld: 110 mg/dL — ABNORMAL HIGH (ref 65–99)
HCT: 26 % — ABNORMAL LOW (ref 36.0–46.0)
Hemoglobin: 8.8 g/dL — ABNORMAL LOW (ref 12.0–15.0)
POTASSIUM: 3.9 mmol/L (ref 3.5–5.1)
Sodium: 136 mmol/L (ref 135–145)
TCO2: 24 mmol/L (ref 22–32)

## 2018-02-27 LAB — MAGNESIUM: Magnesium: 1.8 mg/dL (ref 1.7–2.4)

## 2018-02-27 NOTE — Progress Notes (Signed)
Hemodialysis- Patient tolerated treatment well without issue. Total UF 1.5L. System did clot x1, however no blood loss. Restarted without issue. MD aware. Patient frustrated about treatment time, agitated. Discussed the importance of treatment time in relation to dialysis. Report given to primary RN. All vitals stable. Patient left unit in stable condition.

## 2018-02-27 NOTE — Procedures (Signed)
Patient seen and examined on Hemodialysis. QB 400 mL /min via tunneled HD cath, UF goal 2L.  Tolerating rx well.  Clotted system mid-run- will add heparin next rx  Treatment adjusted as needed.  Madelon Lips MD [facility] pgr [phone] 11:45 AM

## 2018-02-27 NOTE — Progress Notes (Signed)
Physical Therapy Treatment Patient Details Name: Rhonda Lynch MRN: [id number] DOB: December 22, 1955 Today's Date: 02/27/2018    History of Present Illness Pt is a 62 yo female presenting with epigastric pain and was tachypneic. PMH: cocaine and alcohol abuse. Pt found to have PNA, microctic anemia, renal failurs and HTN urgency.    PT Comments    Pt limited in progress today with onset of dizziness and confusion associated with oxygen desaturation to 83%O2 with ambulation (see Gait Notes). Pt mod I for bed mobility, supervision for transfers and min guard for ambulation until dizzy episode at which time pt required min A to return to room. RN notified and reports she will inform the physician. PT will continue to work with pt until d/c.    Follow Up Recommendations  Supervision/Assistance - 24 hour;Home health PT     Equipment Recommendations  None recommended by PT       Precautions / Restrictions Precautions Precautions: Fall Precaution Comments: watch O2 Restrictions Weight Bearing Restrictions: No    Mobility  Bed Mobility Overal bed mobility: Modified Independent                Transfers Overall transfer level: Needs assistance   Transfers: Sit to/from Stand Sit to Stand: Supervision         General transfer comment: supervision for safety  Ambulation/Gait Ambulation/Gait assistance: Min guard;Min assist Gait Distance (Feet): 200 Feet Assistive device: None Gait Pattern/deviations: Step-through pattern;Decreased step length - right;Decreased step length - left;Decreased stride length Gait velocity: slow Gait velocity interpretation: >2.62 ft/sec, indicative of community ambulatory General Gait Details: min guard initially after about 100 feet ambulation, pt c/o dizziness SaO2 on RA 83%O2, vc for pursed lipped breathing and O2 rebounded to 92%O2, ambulated back to room with 1 person HHA, SaO2 remained above 88%O2 during ambulation. Once back in room pt c/o  again of increased dizziness. Pt also exhibited increased confusion, she could not figure out how to get back in bed and started to crawl in face first with her head at the foot of the bed. Pt reoriented in bed and vital taken SaO2 95% HR 88 BP 140/75, RN staff notified. After 5 minutes pt no longer confused with no dizziness.       Balance Overall balance assessment: Needs assistance Sitting-balance support: No upper extremity supported;Feet unsupported Sitting balance-Leahy Scale: Good       Standing balance-Leahy Scale: Fair                              Cognition Arousal/Alertness: Awake/alert Behavior During Therapy: WFL for tasks assessed/performed Overall Cognitive Status: Within Functional Limits for tasks assessed                                               Pertinent Vitals/Pain Pain Assessment: Faces Faces Pain Scale: Hurts a little bit Pain Location: generalized  Pain Descriptors / Indicators: Sore Pain Intervention(s): Limited activity within patient's tolerance;Monitored during session           PT Goals (current goals can now be found in the care plan section) Acute Rehab PT Goals Patient Stated Goal: get better PT Goal Formulation: With patient Time For Goal Achievement: 03/01/18 Potential to Achieve Goals: Good Progress towards PT goals: Not progressing toward goals - comment(limited by dizziness )  Frequency    Min 3X/week      PT Plan Current plan remains appropriate       AM-PAC PT "6 Clicks" Daily Activity  Outcome Measure  Difficulty turning over in bed (including adjusting bedclothes, sheets and blankets)?: None Difficulty moving from lying on back to sitting on the side of the bed? : None Difficulty sitting down on and standing up from a chair with arms (e.g., wheelchair, bedside commode, etc,.)?: None Help needed moving to and from a bed to chair (including a wheelchair)?: None Help needed walking in  hospital room?: A Little Help needed climbing 3-5 steps with a railing? : A Little 6 Click Score: 22    End of Session Equipment Utilized During Treatment: Gait belt(had the tank, just in case, but didn't need it) Activity Tolerance: Other (comment)(limited by dizziness and confusion) Patient left: in bed;with call bell/phone within reach(sitting EOB) Nurse Communication: Mobility status PT Visit Diagnosis: Unsteadiness on feet (R26.81);Difficulty in walking, not elsewhere classified (R26.2)     Time: 1535-1600 PT Time Calculation (min) (ACUTE ONLY): 25 min  Charges:  $Gait Training: 23-37 mins                    G Codes:       Lief Palmatier B. Migdalia Dk PT, DPT Acute Rehabilitation  [phone] Pager [phone]     [place] 02/27/2018, 4:18 PM

## 2018-02-27 NOTE — Progress Notes (Signed)
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 Of pulmonary hemorrhage vs BOOP (pulm has seen) 1. Started TPE, 1PV x7, Albumin on 6/12, 6/13, 6/15, 6/17, 6/19 2. Started HD on 6/12, 6/13, 6/15, 6/18.  Next HD 6/20. 3. Solumedrol Pulse started 6/11-6/13, now pred 60mg /d 4. Started cyclophosphamide 100mg /d 02/18/18, monitor for leukopenia- no dosage adjustments required as of yet 5. TMP/SMX DS for PCP prevention start 02/20/18 6. S/p tunneled [facility] 6/19/ s/p vein mapping, will c/s VVS for fistula placement, doesn't appear to be a lot of renal recovery at this time- will d/w pt.    2. Cocaine use 6/5 UDS positive for cocaine 3. S pneumoniae PNA: has finished a course of CTX  4. 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  Rec OP followup, no bronch deemed necessary at this time 5. HCV Ab Positive -- can f/u as outpt for rx (possible contribution to fibrillary GN so will be important) 6. Dispo: pending finishing TPE and determining if pt will need dialysis as OP (I suspect she will based on lack of evidence of renal recovery thus far)   Subjective:    S/p TDC yesterday, got TPE #5.  HD today.     Objective:   BP 134/76   Pulse 73   Temp 98.3 F (36.8 C) (Oral)   Resp 19   Ht 5\' 1"  (1.549 m)   Wt 74.3 kg (163 lb 12.8 oz)   SpO2 96%   BMI 30.95 kg/m   Intake/Output Summary (Last 24 hours) at 02/27/2018 1142 Last data filed at 02/27/2018 0445 Gross per 24 hour  Intake 250 ml  Output -  Net 250 ml   Weight change: 1 kg (2 lb 3.3 oz)  Physical Exam: GEN: sleeping, easily arousable CV: RRR PULM: CTAB EXT: trace edema noted No rashes/noted R IJ tunneled HD cath present    Imaging/ Studies: Renal Bx 6/10: active pauci immune GN with 70% cellular crescents and very little chronicity / fibrosing; EM with Fibrillary GN as well  HR Chest CT 6/10:  IMPRESSION: 1. The appearance of the lungs is nonspecific, but there are imaging findings that are suggestive of cryptogenic organizing pneumonia (COP). Other differential considerations include both cardiogenic and noncardiogenic edema, however, this is not strongly favored. 2. Aortic atherosclerosis.   Labs: BMET Recent Labs  Lab 02/21/18 0335 02/22/18 0400 02/23/18 0324 02/23/18 1331 02/24/18 1739 02/25/18 0442 02/26/18 0432 02/26/18 1720 02/27/18 0305  NA 138 137 137 136 135 139 136 136 143  K 4.8 4.6 4.7 4.2 5.0 5.9* 4.7 3.9 4.0  CL 108 105 103 101 100* 109 104 98* 109  CO2 22 23 23 22   --  18* 26  --  24  GLUCOSE 286* 125* 321* 172* 289* 228* 174* 110* 187*  BUN 62* 90* 62* 75* 91* 107* 60* 59* 76*  CREATININE 4.35* 5.45* 4.47* 4.74* 6.50* 7.09* 4.65* 4.80* 5.80*  CALCIUM 8.1* 8.2* 7.6* 7.8*  --  7.9* 8.0*  --  7.6*  PHOS 6.1*  --  6.9* 5.4*  --   --  4.0  --   --    CBC Recent Labs  Lab 02/24/18 0257  02/25/18 0442 02/26/18 0432 02/26/18 1720 02/27/18 0305  WBC 15.9*  --  11.7* 15.8*  --  13.2*  NEUTROABS 11.5*  --   --   --   --  9.7*  HGB 10.4*   < > 9.4* 8.7* 8.8* 8.4*  HCT 32.3*   < > 29.0* 26.5* 26.0* 25.6*  MCV 80.3  --  79.7 78.9  --  79.8  PLT 105*  --  99* 111*  --  131*   < > = values in this interval not displayed.    Medications:    . Chlorhexidine Gluconate Cloth  6 each Topical Q0600  . cyclophosphamide  100 mg Oral Daily  . famotidine  20 mg Oral Daily  . heparin  1,000 Units Intracatheter Once  . insulin aspart  0-20 Units Subcutaneous TID WC  . insulin aspart  0-5 Units Subcutaneous QHS  . insulin aspart  3 Units Subcutaneous TID WC  . insulin glargine  15 Units Subcutaneous Daily  . multivitamin with minerals  1 tablet Oral Daily  . predniSONE  60 mg Oral Q breakfast  . sodium chloride flush  3 mL Intravenous Q12H  . sulfamethoxazole-trimethoprim  1 tablet Oral Once per day on Mon Wed Fri  . thiamine  100 mg Oral Daily       Madelon Lips, MD 02/27/2018, 11:42 AM

## 2018-02-27 NOTE — Care Management Note (Addendum)
Case Management Note  Patient Details  Name: Rhonda Lynch MRN: [id number] Date of Birth: 06-Mar-1956  Subjective/Objective:  AKI with rapid progressive glomerulonephritis vasculitis, started on HD ( has had 4) and has had 5 therapeutic plasma exchanges, will need 7, a tunneled catheter was placed yesterday.  Plan is to finish receiving tpe and determine if she will need dialysis outpt.  6/25 Tomi Bamberger RN, BSN - she will need HHPT, will check with Butch Penny with [facility] to see if we can do charity HHPT.                    Action/Plan: NCM will follow for dc needs.   Expected Discharge Date:  02/17/18               Expected Discharge Plan:  Home/Self Care  In-House Referral:     Discharge planning Services  CM Consult  Post Acute Care Choice:    Choice offered to:     DME Arranged:    DME Agency:     HH Arranged:    HH Agency:     Status of Service:  In process, will continue to follow  If discussed at Long Length of Stay Meetings, dates discussed:    Additional Comments:  Zenon Mayo, RN 02/27/2018, 2:22 PM

## 2018-02-27 NOTE — Progress Notes (Signed)
PROGRESS NOTE    LINDALOU SOLTIS  ZDG:[id number] DOB: [birth year] DOA: 02/12/2018 PCP: Patient, No Pcp Per     Brief Narrative: 62 year old woman with past medical history relevant cocaine and alcohol abuse admitted on 02/12/2018 with abdominal pain, nausea, vomiting found to be septic secondary to Streptococcus pneumonia (only urinary antigen positive, no blood cultures), and found to have AKI likely secondary to MPO positive rapidly progressive glomerulonephritis and pulmonary evidence of diffuse alveolar hemorrhage versus bronchiolitis obliterans with organizing pneumonia/cryptogenic organizing pneumonia.  She is progressed to end-stage renal disease and is currently going to dialysis, as well as plasmapheresis and cyclophosphamide treatment.   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 was positive.  Unclear if related to underlying pulmonary process however this is not been completely evaluated.  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 -Nephrology consult -Continue Plex x7 -Status post Solu-Medrol on 6 11/2 through 02/20/2018, currently on prednisone 60 mg daily - Cyclophosphamide started on 02/18/2018 per pulmonology -On Bactrim PCP prophylaxis -Pending permanent catheter placement by IR  #) Pulmonary infiltrates: On admission these were attributed to possible community acquired pneumonia however on high-resolution CT there was a concern this could  be diffuse alveolar hemorrhage versus bronchiolitis obliterans or cryptogenic organizing pneumonia.  Currently she appears to be stable from this perspective and is not received bronchoscopy.  It is unclear if this is related to her RPGN. -: Following appreciate recommendations  #) Sepsis secondary to streptococcal pneumonia: Urine antigen was only positive.  The -Completed course of antibiotics, resolved -Blood cultures from 02/12/2018-  #) HCV antibody positive: Patient can follow-up as an outpatient for this.  Per the nephrology note the biopsy showed evidence of fibrillary, you nephritis that could be possible HCV related.  #) Cocaine abuse: Patient's UDS was positive for cocaine on admission. -Propofol counseling provided  Fluids: Restricted Letter lites: Monitor and supplement Nutrition: Renal diet  Prophylaxis: Subcu heparin  Disposition: Pending outpatient dialysis and completion of plasmapheresis  Full code    Consultants:   Nephrology  Interventional radiology  Pulmonology  Procedures:   Therapeutic plasma exchange on 02/18/2018, 02/20/2018, 02/22/2018, 02/24/2018, 02/26/2018  02/26/2018 IR guided tunneled catheter for dialysis  Antimicrobials:   None currently   Subjective: Patient is quite upset about being n.p.o.  Objective: Vitals:   02/27/18 0900 02/27/18 0930 02/27/18 1000 02/27/18 1030  BP: (!) 106/43 128/67 109/69 109/63  Pulse: 68 69 72 75  Resp: 16 15 16 19   Temp:      TempSrc:      SpO2: 100% 96% 95% 96%  Weight:      Height:        Intake/Output Summary (Last 24 hours) at 02/27/2018 1057 Last data filed at 02/27/2018 0445 Gross per 24 hour  Intake 250 ml  Output -  Net 250 ml   Filed Weights   02/26/18 0622 02/27/18 0500 02/27/18 0744  Weight: 74.1 kg (163 lb 5.8 oz)  74.1 kg (163 lb 5.8 oz) 74.3 kg (163 lb 12.8 oz)    Examination:  General exam: Appears calm and comfortable  Respiratory system: No increased work of breathing,  diminished lung sounds at bases, dry crackles at bases, no wheezes or rhonchi Cardiovascular system: Distant heart sounds, regular rate and rhythm, no murmurs Gastrointestinal system: Abdomen is nondistended, soft and nontender. No organomegaly or masses felt. Normal bowel sounds heard. Central nervous system: Alert and oriented. No focal neurological deficits. Extremities: Trace lower extremity edema. Skin: Tunneled catheter site is clean dry and intact Psychiatry: Judgement and insight appear normal. Mood & affect appropriate.     Data Reviewed: I have personally reviewed following labs and imaging studies  CBC: Recent Labs  Lab 02/23/18 0324 02/24/18 0257 02/24/18 1739 02/25/18 0442 02/26/18 0432 02/26/18 1720 02/27/18 0305  WBC 16.5* 15.9*  --  11.7* 15.8*  --  13.2*  NEUTROABS  --  11.5*  --   --   --   --  9.7*  HGB 9.6* 10.4* 9.9* 9.4* 8.7* 8.8* 8.4*  HCT 29.3* 32.3* 29.0* 29.0* 26.5* 26.0* 25.6*  MCV 80.1 80.3  --  79.7 78.9  --  79.8  PLT 110* 105*  --  99* 111*  --  026*   Basic Metabolic Panel: Recent Labs  Lab 02/21/18 0335  02/23/18 0324 02/23/18 1331 02/24/18 1739 02/25/18 0442 02/26/18 0432 02/26/18 1720 02/27/18 0305  NA 138   < > 137 136 135 139 136 136 143  K 4.8   < > 4.7 4.2 5.0 5.9* 4.7 3.9 4.0  CL 108   < > 103 101 100* 109 104 98* 109  CO2 22   < > 23 22  --  18* 26  --  24  GLUCOSE 286*   < > 321* 172* 289* 228* 174* 110* 187*  BUN 62*   < > 62* 75* 91* 107* 60* 59* 76*  CREATININE 4.35*   < > 4.47* 4.74* 6.50* 7.09* 4.65* 4.80* 5.80*  CALCIUM 8.1*   < > 7.6* 7.8*  --  7.9* 8.0*  --  7.6*  MG  --   --   --   --   --   --   --   --  1.8  PHOS 6.1*  --  6.9* 5.4*  --   --  4.0  --   --    < > = values in this interval not displayed.   GFR: Estimated Creatinine Clearance: 9 mL/min (A) (by C-G formula based on SCr of 5.8 mg/dL (H)). Liver Function Tests: Recent Labs  Lab 02/21/18 0335 02/23/18 0324 02/23/18 1331 02/26/18 0432  ALBUMIN  3.3* 3.4* 3.6 3.3*   No results for input(s): LIPASE, AMYLASE in the last 168 hours. No results for input(s): AMMONIA in the last 168 hours. Coagulation Profile: Recent Labs  Lab 02/25/18 1559  INR 1.20   Cardiac Enzymes: No results for input(s): CKTOTAL, CKMB, CKMBINDEX, TROPONINI in the last 168 hours. BNP (last 3 results) No results for input(s): PROBNP in the last 8760 hours. HbA1C: No results for input(s): HGBA1C in the last 72 hours. CBG: Recent Labs  Lab 02/25/18 2103 02/26/18 0740 02/26/18 1137 02/26/18 1859 02/26/18 1955  GLUCAP 167* 142* 134* 168* 223*   Lipid Profile: No results for input(s): CHOL, HDL, LDLCALC, TRIG, CHOLHDL, LDLDIRECT in the last 72 hours. Thyroid Function Tests: No results for input(s): TSH, T4TOTAL, FREET4, T3FREE, THYROIDAB in the last 72 hours. Anemia Panel:  No results for input(s): VITAMINB12, FOLATE, FERRITIN, TIBC, IRON, RETICCTPCT in the last 72 hours. Sepsis Labs: No results for input(s): PROCALCITON, LATICACIDVEN in the last 168 hours.  No results found for this or any previous visit (from the past 240 hour(s)).       Radiology Studies: Ir Fluoro Guide Cv Line Right  Result Date: 02/26/2018 INDICATION: 50 year old with acute kidney injury and on dialysis. Patient currently has a non tunneled dialysis catheter and needs conversion to a tunneled dialysis catheter. EXAM: CONVERSION OF NON TUNNELED DIALYSIS CATHETER TO A TUNNELED DIALYSIS CATHETER WITH FLUOROSCOPY Physician: Stephan Minister. Anselm Pancoast, MD MEDICATIONS: Ancef 2 g; The antibiotic was administered within an appropriate time interval prior to skin puncture. ANESTHESIA/SEDATION: Versed 1.5 mg IV; Fentanyl 100 mcg IV; Moderate Sedation Time:  20 minutes The patient was continuously monitored during the procedure by the interventional radiology nurse under my direct supervision. FLUOROSCOPY TIME:  Fluoroscopy Time: 18 seconds, 1 mGy COMPLICATIONS: None immediate. PROCEDURE: The existing right  jugular dialysis catheter was evaluated. This catheter was felt to be adequate for conversion to a tunneled catheter. The procedure was explained to the patient. The risks and benefits of the procedure were discussed and the patient's questions were addressed. Informed consent was obtained from the patient. The existing catheter and the right upper chest were prepped and draped in a sterile fashion. Maximal barrier sterile technique was utilized including caps, mask, sterile gowns, sterile gloves, sterile drape, hand hygiene and skin antiseptic. The skin below the right clavicle and the skin around the catheter were anesthetized with 1% lidocaine. A small incision was made below the right clavicle. A subcutaneous tunnel was formed from the incision to the old catheter site. The old catheter was removed over a Amplatz wire. The wire was advanced into the IVC. A 19 cm tip to cuff Palindrome catheter was pulled through the subcutaneous tunnel. Peel-away sheath was placed over the Amplatz wire. The catheter was placed through the peel-away sheath and positioned at the superior cavoatrial junction. Both lumens aspirated and flushed well. The catheter lumens were flushed with appropriate amount of heparin. The catheter was sutured to skin with Prolene suture. The old jugular right access site was closed using 2 interrupted Ethilon sutures. Fluoroscopic images were taken and saved for this procedure. FINDINGS: Catheter tip at the superior cavoatrial junction. IMPRESSION: Successful conversion of the right jugular catheter to a tunneled dialysis catheter. Right neck sutures can be removed in 10-14 days. Electronically Signed   By: Markus Daft M.D.   On: 02/26/2018 17:16        Scheduled Meds: . Chlorhexidine Gluconate Cloth  6 each Topical Q0600  . cyclophosphamide  100 mg Oral Daily  . famotidine  20 mg Oral Daily  . heparin  1,000 Units Intracatheter Once  . insulin aspart  0-20 Units Subcutaneous TID WC  .  insulin aspart  0-5 Units Subcutaneous QHS  . insulin aspart  3 Units Subcutaneous TID WC  . insulin glargine  15 Units Subcutaneous Daily  . multivitamin with minerals  1 tablet Oral Daily  . predniSONE  60 mg Oral Q breakfast  . sodium chloride flush  3 mL Intravenous Q12H  . sulfamethoxazole-trimethoprim  1 tablet Oral Once per day on Mon Wed Fri  . thiamine  100 mg Oral Daily   Continuous Infusions: .  ceFAZolin (ANCEF) IV    . citrate dextrose       LOS: 15 days    Time spent: 35  Cristy Folks, MD [facility]   If 7PM-7AM, please contact night-coverage [email or web address] Password [email or web address] 02/27/2018, 10:57 AM

## 2018-02-28 DIAGNOSIS — N184 Chronic kidney disease, stage 4 (severe): Secondary | ICD-10-CM

## 2018-02-28 LAB — CBC
HCT: 25.6 % — ABNORMAL LOW (ref 36.0–46.0)
Hemoglobin: 8.3 g/dL — ABNORMAL LOW (ref 12.0–15.0)
MCH: 25.8 pg — ABNORMAL LOW (ref 26.0–34.0)
MCHC: 32.4 g/dL (ref 30.0–36.0)
MCV: 79.5 fL (ref 78.0–100.0)
Platelets: 120 K/uL — ABNORMAL LOW (ref 150–400)
RBC: 3.22 MIL/uL — ABNORMAL LOW (ref 3.87–5.11)
RDW: 20.7 % — ABNORMAL HIGH (ref 11.5–15.5)
WBC: 9.3 K/uL (ref 4.0–10.5)

## 2018-02-28 LAB — RENAL FUNCTION PANEL
Albumin: 3.4 g/dL — ABNORMAL LOW (ref 3.5–5.0)
Anion gap: 6 (ref 5–15)
BUN: 38 mg/dL — ABNORMAL HIGH (ref 6–20)
CO2: 28 mmol/L (ref 22–32)
Calcium: 7.9 mg/dL — ABNORMAL LOW (ref 8.9–10.3)
Chloride: 106 mmol/L (ref 101–111)
Creatinine, Ser: 3.57 mg/dL — ABNORMAL HIGH (ref 0.44–1.00)
GFR calc Af Amer: 14 mL/min — ABNORMAL LOW (ref >60)
GFR calc non Af Amer: 13 mL/min — ABNORMAL LOW (ref >60)
Glucose, Bld: 184 mg/dL — ABNORMAL HIGH (ref 65–99)
Phosphorus: 3.2 mg/dL (ref 2.5–4.6)
Potassium: 4.9 mmol/L (ref 3.5–5.1)
Sodium: 140 mmol/L (ref 135–145)

## 2018-02-28 LAB — POCT I-STAT, CHEM 8
BUN: 42 mg/dL — ABNORMAL HIGH (ref 6–20)
Calcium, Ion: 1.12 mmol/L — ABNORMAL LOW (ref 1.15–1.40)
Chloride: 101 mmol/L (ref 101–111)
Creatinine, Ser: 4.3 mg/dL — ABNORMAL HIGH (ref 0.44–1.00)
Glucose, Bld: 239 mg/dL — ABNORMAL HIGH (ref 65–99)
HEMATOCRIT: 27 % — AB (ref 36.0–46.0)
HEMOGLOBIN: 9.2 g/dL — AB (ref 12.0–15.0)
Potassium: 4.4 mmol/L (ref 3.5–5.1)
SODIUM: 138 mmol/L (ref 135–145)
TCO2: 24 mmol/L (ref 22–32)

## 2018-02-28 LAB — GLUCOSE, CAPILLARY
GLUCOSE-CAPILLARY: 120 mg/dL — AB (ref 65–99)
GLUCOSE-CAPILLARY: 240 mg/dL — AB (ref 65–99)
Glucose-Capillary: 297 mg/dL — ABNORMAL HIGH (ref 65–99)
Glucose-Capillary: 299 mg/dL — ABNORMAL HIGH (ref 65–99)

## 2018-02-28 MED ORDER — HEPARIN SODIUM (PORCINE) 1000 UNIT/ML IJ SOLN
1000.0000 [IU] | Freq: Once | INTRAMUSCULAR | Status: DC
  Filled 2018-02-28: qty 1

## 2018-02-28 MED ORDER — DIPHENHYDRAMINE HCL 25 MG PO CAPS: 25.0000 mg | ORAL_CAPSULE | Freq: Four times a day (QID) | ORAL | Status: DC | PRN

## 2018-02-28 MED ORDER — CALCIUM CARBONATE ANTACID 500 MG PO CHEW
2.0000 | CHEWABLE_TABLET | ORAL | Status: AC
  Administered 2018-02-28: 400 mg via ORAL

## 2018-02-28 MED ORDER — SODIUM CHLORIDE 0.9 % IV SOLN
INTRAVENOUS | Status: AC
  Administered 2018-02-28 (×3): via INTRAVENOUS_CENTRAL
  Filled 2018-02-28 (×2): qty 200
  Filled 2018-02-28: qty 50

## 2018-02-28 MED ORDER — SODIUM CHLORIDE 0.9 % IV SOLN
2.0000 g | Freq: Once | INTRAVENOUS | Status: AC
  Administered 2018-02-28: 2 g via INTRAVENOUS
  Filled 2018-02-28: qty 20

## 2018-02-28 MED ORDER — CALCIUM CARBONATE ANTACID 500 MG PO CHEW
CHEWABLE_TABLET | ORAL | Status: AC
  Administered 2018-02-28: 400 mg via ORAL
  Filled 2018-02-28: qty 2

## 2018-02-28 MED ORDER — ACD FORMULA A 0.73-2.45-2.2 GM/100ML VI SOLN
Status: AC
Start: 2018-02-28 — End: 2018-02-28
  Administered 2018-02-28: 500 mL via INTRAVENOUS
  Filled 2018-02-28: qty 500

## 2018-02-28 MED ORDER — ACETAMINOPHEN 325 MG PO TABS: 650.0000 mg | ORAL_TABLET | ORAL | Status: DC | PRN

## 2018-02-28 MED ORDER — ACD FORMULA A 0.73-2.45-2.2 GM/100ML VI SOLN
500.0000 mL | Status: DC
  Administered 2018-02-28: 500 mL via INTRAVENOUS
  Filled 2018-02-28 (×2): qty 500

## 2018-02-28 NOTE — Progress Notes (Signed)
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 Of pulmonary hemorrhage vs BOOP (pulm has seen) 1. Started TPE, 1PV x7, Albumin on 6/12, 6/13, 6/15, 6/17, 6/19, 6/21 2. Started HD on 6/12, 6/13, 6/15, 6/18, 6/20.  Next HD 6/22. 3. Solumedrol Pulse started 6/11-6/13, now pred 60mg /d 4. Started cyclophosphamide 100mg /d 02/18/18, monitor for leukopenia- no dosage adjustments required as of yet 5. TMP/SMX DS for PCP prevention start 02/20/18 6. S/p tunneled [facility] 6/19/ s/p vein mapping, will c/s VVS for fistula placement.  Appreciate assistance.  Since 70% of glomeruli affected (although relatively little IFTA) I'm not sure how fast/ how much her recovery will be. 2. Cocaine use 6/5 UDS positive for cocaine 3. S pneumoniae PNA: has finished a course of CTX  4. 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  Rec OP followup, no bronch deemed necessary at this time 5. HCV Ab Positive -- can f/u as outpt for rx (possible contribution to fibrillary GN so will be important) 6. Dispo: pending finishing TPE, treatment #7 Monday, OP dialysis placement.   Subjective:    For TPE #6 today.  Discussed with pt that although she's making some urine, doesn't appear that there is a lot of renal recovery.  I expect her to need to leave the hospital on dialysis.  Discussed perm access placement- pt is agreeable to c/s.   Objective:   BP (!) 142/85   Pulse 90   Temp 98.8 F (37.1 C) (Oral)   Resp 18   Ht 5\' 1"  (1.549 m)   Wt 74.6 kg (164 lb 7.4 oz)   SpO2 100%   BMI 31.08 kg/m   Intake/Output Summary (Last 24 hours) at 02/28/2018 1304 Last data filed at 02/28/2018 1024 Gross per 24 hour  Intake 1200 ml  Output 1 ml  Net 1199 ml   Weight change: 0.2 kg (7.1 oz)  Physical Exam: GEN: sitting on edge of b ed, NAD CV: RRR PULM: CTAB EXT: trace edema noted No rashes/noted R IJ  tunneled HD cath present    Imaging/ Studies: Renal Bx 6/10: active pauci immune GN with 70% cellular crescents and very little chronicity / fibrosing; EM with Fibrillary GN as well  HR Chest CT 6/10: IMPRESSION: 1. The appearance of the lungs is nonspecific, but there are imaging findings that are suggestive of cryptogenic organizing pneumonia (COP). Other differential considerations include both cardiogenic and noncardiogenic edema, however, this is not strongly favored. 2. Aortic atherosclerosis.   Labs: BMET Recent Labs  Lab 02/22/18 0400 02/23/18 0324 02/23/18 1331 02/24/18 1739 02/25/18 0442 02/26/18 0432 02/26/18 1720 02/27/18 0305 02/28/18 0253  NA 137 137 136 135 139 136 136 143 140  K 4.6 4.7 4.2 5.0 5.9* 4.7 3.9 4.0 4.9  CL 105 103 101 100* 109 104 98* 109 106  CO2 23 23 22   --  18* 26  --  24 28  GLUCOSE 125* 321* 172* 289* 228* 174* 110* 187* 184*  BUN 90* 62* 75* 91* 107* 60* 59* 76* 38*  CREATININE 5.45* 4.47* 4.74* 6.50* 7.09* 4.65* 4.80* 5.80* 3.57*  CALCIUM 8.2* 7.6* 7.8*  --  7.9* 8.0*  --  7.6* 7.9*  PHOS  --  6.9* 5.4*  --   --  4.0  --   --  3.2   CBC Recent Labs  Lab 02/24/18 0257  02/25/18 0442 02/26/18 0432  02/26/18 1720 02/27/18 0305 02/28/18 0253  WBC 15.9*  --  11.7* 15.8*  --  13.2* 9.3  NEUTROABS 11.5*  --   --   --   --  9.7*  --   HGB 10.4*   < > 9.4* 8.7* 8.8* 8.4* 8.3*  HCT 32.3*   < > 29.0* 26.5* 26.0* 25.6* 25.6*  MCV 80.3  --  79.7 78.9  --  79.8 79.5  PLT 105*  --  99* 111*  --  131* 120*   < > = values in this interval not displayed.    Medications:    . Chlorhexidine Gluconate Cloth  6 each Topical Q0600  . cyclophosphamide  100 mg Oral Daily  . famotidine  20 mg Oral Daily  . insulin aspart  0-20 Units Subcutaneous TID WC  . insulin aspart  0-5 Units Subcutaneous QHS  . insulin aspart  3 Units Subcutaneous TID WC  . insulin glargine  15 Units Subcutaneous Daily  . multivitamin with minerals  1 tablet Oral Daily  .  predniSONE  60 mg Oral Q breakfast  . sodium chloride flush  3 mL Intravenous Q12H  . sulfamethoxazole-trimethoprim  1 tablet Oral Once per day on Mon Wed Fri  . thiamine  100 mg Oral Daily      Madelon Lips, MD 02/28/2018, 1:04 PM

## 2018-02-28 NOTE — Progress Notes (Signed)
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  She is progressed to end-stage renal disease and is currently going to dialysis, as well as plasmapheresis and cyclophosphamide treatment.   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  Unclear if related to underlying pulmonary process however this is not been completely evaluated.  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 -Nephrology consult -Continue Plex x7 -Status post Solu-Medrol on 6 11/2 through 02/20/2018, currently on prednisone 60 mg daily - Cyclophosphamide started on 02/18/2018  -On Bactrim PCP prophylaxis -Status post tunnel catheter placement by IR  #) Pulmonary infiltrates: Per pulmonary diffuse alveolar hemorrhage versus bronchiolitis obliterans or cryptogenic organizing pneumonia.  Currently she appears to be stable from  this perspective and is not received bronchoscopy.  It is unclear if this is related to her RPGN. - Pulmonary following appreciate recommendations  #) Sepsis secondary to streptococcal pneumonia: Urine antigen was only positive.  The -Completed course of antibiotics, resolved -Blood cultures from 02/12/2018 no growth to date  #) HCV antibody positive: Patient can follow-up as an outpatient for this.  Per the nephrology note the biopsy showed evidence of fibrillary, you nephritis that could be possible HCV related.  #) Cocaine abuse: Patient's UDS was positive for cocaine on admission. - counseling provided  Fluids: Restricted Letter lites: Monitor and supplement Nutrition: Renal diet  Prophylaxis: Subcu heparin  Disposition: Pending outpatient dialysis and completion of plasmapheresis  Full code    Consultants:   Nephrology  Interventional radiology  Pulmonology  Procedures:   Therapeutic plasma exchange on 02/18/2018, 02/20/2018, 02/22/2018, 02/24/2018, 02/26/2018  02/26/2018 IR guided tunneled catheter for dialysis  Antimicrobials:   None currently   Subjective: Patient is in a much better mood today.  She denies any nausea, vomiting, diarrhea.  She is eager to know what the plan is for today.  Objective: Vitals:   02/27/18 1600 02/28/18 0005 02/28/18 0500 02/28/18 0858  BP: (!) 137/59 (!) 150/68  (!) 142/85  Pulse: 75 64  90  Resp: 20 18  18   Temp: 99 F (37.2 C) 98.7 F (37.1 C)  98.8 F (37.1 C)  TempSrc: Oral Oral  Oral  SpO2: 96% 97%  100%  Weight:   74.6 kg (164 lb 7.4 oz)   Height:        Intake/Output Summary (Last 24 hours) at 02/28/2018 0948 Last data filed at 02/28/2018 0928 Gross per 24 hour  Intake 1320 ml  Output 1506 ml  Net -186 ml   Filed Weights   02/27/18 0744 02/27/18 1150 02/28/18 0500  Weight: 74.3 kg (163 lb 12.8 oz) 72.1 kg (158 lb 15.2 oz) 74.6 kg (164 lb 7.4 oz)    Examination:  General exam: Appears calm and comfortable    Respiratory system: No increased work of breathing, diminished lung sounds at bases, dry crackles at bases, no wheezes or rhonchi Cardiovascular system: Distant heart sounds, regular rate and rhythm, no murmurs Gastrointestinal system: Abdomen is nondistended, soft and nontender. No organomegaly or masses felt. Normal bowel sounds heard. Central nervous system: Alert and oriented. No focal neurological deficits. Extremities: no lower extremity edema. Skin: Tunneled catheter site is clean dry and intact Psychiatry: Judgement and insight appear normal. Mood & affect appropriate.     Data Reviewed: I have personally reviewed following labs and imaging studies  CBC: Recent Labs  Lab 02/24/18 0257  02/25/18 0442 02/26/18 0432 02/26/18 1720 02/27/18 0305 02/28/18 0253  WBC 15.9*  --  11.7* 15.8*  --  13.2* 9.3  NEUTROABS 11.5*  --   --   --   --  9.7*  --   HGB 10.4*   < > 9.4* 8.7* 8.8* 8.4* 8.3*  HCT 32.3*   < > 29.0* 26.5* 26.0* 25.6* 25.6*  MCV 80.3  --  79.7 78.9  --  79.8 79.5  PLT 105*  --  99* 111*  --  131* 120*   < > = values in this interval not displayed.   Basic Metabolic Panel: Recent Labs  Lab 02/23/18 0324 02/23/18 1331  02/25/18 0442 02/26/18 0432 02/26/18 1720 02/27/18 0305 02/28/18 0253  NA 137 136   < > 139 136 136 143 140  K 4.7 4.2   < > 5.9* 4.7 3.9 4.0 4.9  CL 103 101   < > 109 104 98* 109 106  CO2 23 22  --  18* 26  --  24 28  GLUCOSE 321* 172*   < > 228* 174* 110* 187* 184*  BUN 62* 75*   < > 107* 60* 59* 76* 38*  CREATININE 4.47* 4.74*   < > 7.09* 4.65* 4.80* 5.80* 3.57*  CALCIUM 7.6* 7.8*  --  7.9* 8.0*  --  7.6* 7.9*  MG  --   --   --   --   --   --  1.8  --   PHOS 6.9* 5.4*  --   --  4.0  --   --  3.2   < > = values in this interval not displayed.   GFR: Estimated Creatinine Clearance: 14.7 mL/min (A) (by C-G formula based on SCr of 3.57 mg/dL (H)). Liver Function Tests: Recent Labs  Lab 02/23/18 0324 02/23/18 1331 02/26/18 0432  02/28/18 0253  ALBUMIN 3.4* 3.6 3.3* 3.4*   No results for input(s): LIPASE, AMYLASE in the last 168 hours. No results for input(s): AMMONIA in the last 168 hours. Coagulation Profile: Recent Labs  Lab 02/25/18 1559  INR 1.20   Cardiac Enzymes: No results for input(s): CKTOTAL, CKMB, CKMBINDEX, TROPONINI in the last 168 hours. BNP (last 3 results) No results for input(s): PROBNP in the last 8760 hours. HbA1C: No results for input(s): HGBA1C in the last 72 hours. CBG: Recent Labs  Lab 02/26/18 1955 02/27/18 1243 02/27/18 1734 02/27/18 2128 02/28/18 0855  GLUCAP 223* 87 247* 296* 297*   Lipid Profile: No results for input(s): CHOL, HDL, LDLCALC, TRIG, CHOLHDL, LDLDIRECT in the last 72 hours. Thyroid  Function Tests: No results for input(s): TSH, T4TOTAL, FREET4, T3FREE, THYROIDAB in the last 72 hours. Anemia Panel: No results for input(s): VITAMINB12, FOLATE, FERRITIN, TIBC, IRON, RETICCTPCT in the last 72 hours. Sepsis Labs: No results for input(s): PROCALCITON, LATICACIDVEN in the last 168 hours.  No results found for this or any previous visit (from the past 240 hour(s)).       Radiology Studies: Ir Fluoro Guide Cv Line Right  Result Date: 02/26/2018 INDICATION: 52 year old with acute kidney injury and on dialysis. Patient currently has a non tunneled dialysis catheter and needs conversion to a tunneled dialysis catheter. EXAM: CONVERSION OF NON TUNNELED DIALYSIS CATHETER TO A TUNNELED DIALYSIS CATHETER WITH FLUOROSCOPY Physician: Stephan Minister. Anselm Pancoast, MD MEDICATIONS: Ancef 2 g; The antibiotic was administered within an appropriate time interval prior to skin puncture. ANESTHESIA/SEDATION: Versed 1.5 mg IV; Fentanyl 100 mcg IV; Moderate Sedation Time:  20 minutes The patient was continuously monitored during the procedure by the interventional radiology nurse under my direct supervision. FLUOROSCOPY TIME:  Fluoroscopy Time: 18 seconds, 1 mGy COMPLICATIONS: None immediate.  PROCEDURE: The existing right jugular dialysis catheter was evaluated. This catheter was felt to be adequate for conversion to a tunneled catheter. The procedure was explained to the patient. The risks and benefits of the procedure were discussed and the patient's questions were addressed. Informed consent was obtained from the patient. The existing catheter and the right upper chest were prepped and draped in a sterile fashion. Maximal barrier sterile technique was utilized including caps, mask, sterile gowns, sterile gloves, sterile drape, hand hygiene and skin antiseptic. The skin below the right clavicle and the skin around the catheter were anesthetized with 1% lidocaine. A small incision was made below the right clavicle. A subcutaneous tunnel was formed from the incision to the old catheter site. The old catheter was removed over a Amplatz wire. The wire was advanced into the IVC. A 19 cm tip to cuff Palindrome catheter was pulled through the subcutaneous tunnel. Peel-away sheath was placed over the Amplatz wire. The catheter was placed through the peel-away sheath and positioned at the superior cavoatrial junction. Both lumens aspirated and flushed well. The catheter lumens were flushed with appropriate amount of heparin. The catheter was sutured to skin with Prolene suture. The old jugular right access site was closed using 2 interrupted Ethilon sutures. Fluoroscopic images were taken and saved for this procedure. FINDINGS: Catheter tip at the superior cavoatrial junction. IMPRESSION: Successful conversion of the right jugular catheter to a tunneled dialysis catheter. Right neck sutures can be removed in 10-14 days. Electronically Signed   By: Markus Daft M.D.   On: 02/26/2018 17:16        Scheduled Meds: . Chlorhexidine Gluconate Cloth  6 each Topical Q0600  . cyclophosphamide  100 mg Oral Daily  . famotidine  20 mg Oral Daily  . insulin aspart  0-20 Units Subcutaneous TID WC  . insulin aspart   0-5 Units Subcutaneous QHS  . insulin aspart  3 Units Subcutaneous TID WC  . insulin glargine  15 Units Subcutaneous Daily  . multivitamin with minerals  1 tablet Oral Daily  . predniSONE  60 mg Oral Q breakfast  . sodium chloride flush  3 mL Intravenous Q12H  . sulfamethoxazole-trimethoprim  1 tablet Oral Once per day on Mon Wed Fri  . thiamine  100 mg Oral Daily   Continuous Infusions: .  ceFAZolin (ANCEF) IV       LOS: 16 days  Time spent: [place], MD [facility]   If 7PM-7AM, please contact night-coverage [email or web address] Password [facility] 02/28/2018, 9:48 AM

## 2018-02-28 NOTE — Progress Notes (Signed)
Inpatient Diabetes Program Recommendations  AACE/ADA: New Consensus Statement on Inpatient Glycemic Control (2015)  Target Ranges:  Prepandial:   less than 140 mg/dL      Peak postprandial:   less than 180 mg/dL (1-2 hours)      Critically ill patients:  140 - 180 mg/dL   Results for Rhonda Lynch, Rhonda Lynch (MRN [id number]) as of 02/28/2018 09:43  Ref. Range 02/27/2018 12:43 02/27/2018 17:34 02/27/2018 21:28 02/28/2018 08:55  Glucose-Capillary Latest Ref Range: 65 - 99 mg/dL 87 247 (H) 296 (H) 297 (H)   Review of Glycemic Control  Diabetes history: None   Current orders for Inpatient glycemic control:  Lantus 15 units Daily Novolog Resistant Correction 0-20 units tid Novolog HS scale 0-5 units Novolog 3 units tid meal coverage  Inpatient Diabetes Program Recommendations:    Patient back on steroids, PO prednisone 60 mg Daily. Glucose trends in the 290's. Lantus 15 units ordered. Please consider increasing Lantus to 20 units while on steroids. When tapered Lantus dose may have to be decreased. Also consider increase Novolog meal coverage to 5 units tid.  Thanks,  Tama Headings RN, MSN, BC-ADM, [facility] Inpatient Diabetes Coordinator Team Pager [phone] (8a-5p)

## 2018-02-28 NOTE — Consult Note (Signed)
[facility] [place]  Patient name: Rhonda Lynch MRN: [id number] DOB: Sep 13, 1955 Sex: female    HPI: Rhonda Lynch is a 62 y.o. female consulted regarding permanent access for hemodialysis.  Currently is on dialysis via a right IJ tunneled catheter.  Renal failure felt to be related to autoimmune disease.  Has never had arm access.  History reviewed. No pertinent past medical history.  Family History  Problem Relation Age of Onset  . Autoimmune disease Neg Hx     SOCIAL HISTORY: Social History   Tobacco Use  . Smoking status: Current Every Day Smoker    Packs/day: 1.00  . Smokeless tobacco: Never Used  Substance Use Topics  . Alcohol use: Yes    No Known Allergies  Current Facility-Administered Medications  Medication Dose Route Frequency Provider Last Rate Last Dose  . acetaminophen (TYLENOL) tablet 650 mg  650 mg Oral Q6H PRN Opyd, Ilene Qua, MD       Or  . acetaminophen (TYLENOL) suppository 650 mg  650 mg Rectal Q6H PRN Opyd, Ilene Qua, MD      . acetaminophen (TYLENOL) tablet 650 mg  650 mg Oral Q4H PRN Madelon Lips, MD      . albumin human 25 % 50 g in sodium chloride 0.9 %   Dialysis Q1 Hr x 3 Madelon Lips, MD 80 mL/hr at 02/28/18 1604    . calcium carbonate (TUMS - dosed in mg elemental calcium) chewable tablet 400 mg of elemental calcium  2 tablet Oral Q3H Madelon Lips, MD   400 mg of elemental calcium at 02/28/18 1538  . calcium gluconate 2 g in sodium chloride 0.9 % 250 mL IVPB  2 g Intravenous Once Madelon Lips, MD 135 mL/hr at 02/28/18 1450 2 g at 02/28/18 1450  . ceFAZolin (ANCEF) IVPB 2g/100 mL premix  2 g Intravenous Once Markus Daft, MD      . Chlorhexidine Gluconate Cloth 2 % PADS 6 each  6 each Topical Q0600 Rexene Agent, MD   6 each at 02/28/18 [phone]  . citrate dextrose (ACD-A anticoagulant) solution 500 mL  500 mL Intravenous Continuous Madelon Lips, MD 12 mL/hr at  02/28/18 1611 500 mL at 02/28/18 1611  . cyclophosphamide (CYTOXAN) capsule 100 mg  100 mg Oral Daily Cristal Ford, DO   100 mg at 02/28/18 5681  . diphenhydrAMINE (BENADRYL) capsule 25 mg  25 mg Oral Q6H PRN Madelon Lips, MD      . famotidine (PEPCID) tablet 20 mg  20 mg Oral Daily Arrien, Jimmy Picket, MD   20 mg at 02/28/18 2751  . haloperidol lactate (HALDOL) injection 1 mg  1 mg Intravenous Q6H PRN Aline August, MD      . heparin injection 1,000 Units  1,000 Units Intracatheter Once Madelon Lips, MD      . hydrALAZINE (APRESOLINE) injection 10 mg  10 mg Intravenous Q4H PRN Opyd, Ilene Qua, MD   10 mg at 02/18/18 1737  . HYDROcodone-acetaminophen (NORCO/VICODIN) 5-325 MG per tablet 1-2 tablet  1-2 tablet Oral Q4H PRN Opyd, Ilene Qua, MD   1 tablet at 02/21/18 0934  . HYDROmorphone (DILAUDID) injection 1 mg  1 mg Intravenous Q4H PRN Arrien, Jimmy Picket, MD   1 mg at 02/28/18 1404  . insulin aspart (novoLOG) injection 0-20 Units  0-20 Units Subcutaneous TID [facility] Cristal Ford, DO   11 Units at 02/28/18 7001  . insulin aspart (novoLOG) injection 0-5 Units  0-5 Units Subcutaneous  QHS Cristal Ford, DO   3 Units at 02/27/18 2155  . insulin aspart (novoLOG) injection 3 Units  3 Units Subcutaneous TID WC Cristal Ford, DO   3 Units at 02/28/18 8315  . insulin glargine (LANTUS) injection 15 Units  15 Units Subcutaneous Daily Cristal Ford, DO   15 Units at 02/28/18 1761  . LORazepam (ATIVAN) injection 0.5 mg  0.5 mg Intravenous Q6H PRN Aline August, MD   0.5 mg at 02/28/18 0953  . multivitamin with minerals tablet 1 tablet  1 tablet Oral Daily Opyd, Ilene Qua, MD   1 tablet at 02/28/18 0927  . ondansetron (ZOFRAN) tablet 4 mg  4 mg Oral Q6H PRN Opyd, Ilene Qua, MD       Or  . ondansetron (ZOFRAN) injection 4 mg  4 mg Intravenous Q6H PRN Opyd, Ilene Qua, MD   4 mg at 02/13/18 0740  . predniSONE (DELTASONE) tablet 60 mg  60 mg Oral Q breakfast Pearson Grippe B, MD   60 mg  at 02/28/18 0927  . senna-docusate (Senokot-S) tablet 1 tablet  1 tablet Oral QHS PRN Opyd, Ilene Qua, MD      . sodium chloride flush (NS) 0.9 % injection 3 mL  3 mL Intravenous Q12H Opyd, Ilene Qua, MD   3 mL at 02/28/18 0929  . sulfamethoxazole-trimethoprim (BACTRIM DS,SEPTRA DS) 800-160 MG per tablet 1 tablet  1 tablet Oral Once per day on Mon Wed Fri Rexene Agent, MD   1 tablet at 02/28/18 6073  . thiamine (VITAMIN B-1) tablet 100 mg  100 mg Oral Daily Opyd, Ilene Qua, MD   100 mg at 02/28/18 7106  . zolpidem (AMBIEN) tablet 5 mg  5 mg Oral QHS PRN Vertis Kelch, NP   5 mg at 02/27/18 2306    REVIEW OF SYSTEMS:  [X]  denotes positive finding, [ ]  denotes negative finding Cardiac  Comments:  Chest pain or chest pressure:    Shortness of breath upon exertion:    Short of breath when lying flat:    Irregular heart rhythm:        Vascular    Pain in calf, thigh, or hip brought on by ambulation:    Pain in feet at night that wakes you up from your sleep:     Blood clot in your veins:    Leg swelling:           PHYSICAL EXAM: Vitals:   02/28/18 1538 02/28/18 1544 02/28/18 1600 02/28/18 1604  BP: (!) 156/115 (!) 176/116 (!) 143/69 (!) 143/69  Pulse:      Resp: 16 (!) 21 (!) 31 (!) 27  Temp: 99.1 F (37.3 C) 99.1 F (37.3 C) 99.1 F (37.3 C) 99.1 F (37.3 C)  TempSrc: Oral Oral Oral Oral  SpO2: 100% 100%    Weight:      Height:        GENERAL: The patient is a well-nourished female, in no acute distress. The vital signs are documented above. CARDIOVASCULAR: 2-3+ radial pulses bilaterally.  Moderate sized antecubital vein on the left PULMONARY: There is good air exchange  MUSCULOSKELETAL: There are no major deformities or cyanosis. NEUROLOGIC: No focal weakness or paresthesias are detected. SKIN: There are no ulcers or rashes noted. PSYCHIATRIC: The patient has a normal affect.  DATA:  Vein mapping pending  MEDICAL ISSUES: End-stage renal disease currently  with dialysis via right IJ catheter.  Discussed need to place permanent access with the patient.  Discussed options  including AV fistula and AV graft.  She is right-handed.  Will restrict left arm.  Make a formal recommendation for permanent access on a nondialysis day after reviewing the vein map     [name], MD [facility] Specialists of [facility] Tel [phone] Pager [phone]

## 2018-03-01 LAB — RENAL FUNCTION PANEL
Albumin: 3.9 g/dL (ref 3.5–5.0)
Albumin: 3.5 g/dL (ref 3.5–5.0)
Anion gap: 9 (ref 5–15)
Anion gap: 10 (ref 5–15)
BUN: 60 mg/dL — ABNORMAL HIGH (ref 6–20)
BUN: 26 mg/dL — ABNORMAL HIGH (ref 6–20)
CO2: 23 mmol/L (ref 22–32)
CO2: 25 mmol/L (ref 22–32)
Calcium: 8.2 mg/dL — ABNORMAL LOW (ref 8.9–10.3)
Calcium: 7.9 mg/dL — ABNORMAL LOW (ref 8.9–10.3)
Chloride: 107 mmol/L (ref 101–111)
Chloride: 100 mmol/L — ABNORMAL LOW (ref 101–111)
Creatinine, Ser: 5.19 mg/dL — ABNORMAL HIGH (ref 0.44–1.00)
Creatinine, Ser: 3 mg/dL — ABNORMAL HIGH (ref 0.44–1.00)
GFR calc Af Amer: 9 mL/min — ABNORMAL LOW (ref >60)
GFR calc Af Amer: 18 mL/min — ABNORMAL LOW (ref >60)
GFR calc non Af Amer: 8 mL/min — ABNORMAL LOW (ref >60)
GFR calc non Af Amer: 15 mL/min — ABNORMAL LOW (ref >60)
Glucose, Bld: 238 mg/dL — ABNORMAL HIGH (ref 65–99)
Glucose, Bld: 231 mg/dL — ABNORMAL HIGH (ref 65–99)
Phosphorus: 3.4 mg/dL (ref 2.5–4.6)
Phosphorus: 3.1 mg/dL (ref 2.5–4.6)
Potassium: 4.8 mmol/L (ref 3.5–5.1)
Potassium: 3.6 mmol/L (ref 3.5–5.1)
Sodium: 139 mmol/L (ref 135–145)
Sodium: 135 mmol/L (ref 135–145)

## 2018-03-01 LAB — CBC WITH DIFFERENTIAL/PLATELET
Abs Immature Granulocytes: 0.2 10*3/uL — ABNORMAL HIGH (ref 0.0–0.1)
Basophils Absolute: 0 K/uL (ref 0.0–0.1)
Basophils Relative: 0 %
Eosinophils Absolute: 0 K/uL (ref 0.0–0.7)
Eosinophils Relative: 0 %
HCT: 24.4 % — ABNORMAL LOW (ref 36.0–46.0)
Hemoglobin: 8 g/dL — ABNORMAL LOW (ref 12.0–15.0)
Immature Granulocytes: 1 %
Lymphocytes Relative: 4 %
Lymphs Abs: 0.6 10*3/uL — ABNORMAL LOW (ref 0.7–4.0)
MCH: 26.1 pg (ref 26.0–34.0)
MCHC: 32.8 g/dL (ref 30.0–36.0)
MCV: 79.5 fL (ref 78.0–100.0)
Monocytes Absolute: 0.9 K/uL (ref 0.1–1.0)
Monocytes Relative: 6 %
Neutro Abs: 14.9 10*3/uL — ABNORMAL HIGH (ref 1.7–7.7)
Neutrophils Relative %: 89 %
Platelets: 137 10*3/uL — ABNORMAL LOW (ref 150–400)
RBC: 3.07 MIL/uL — ABNORMAL LOW (ref 3.87–5.11)
RDW: 20.9 % — ABNORMAL HIGH (ref 11.5–15.5)
WBC: 16.6 10*3/uL — ABNORMAL HIGH (ref 4.0–10.5)

## 2018-03-01 LAB — BASIC METABOLIC PANEL
Anion gap: 8 (ref 5–15)
BUN: 60 mg/dL — AB (ref 6–20)
CALCIUM: 8.1 mg/dL — AB (ref 8.9–10.3)
CO2: 23 mmol/L (ref 22–32)
CREATININE: 5.22 mg/dL — AB (ref 0.44–1.00)
Chloride: 107 mmol/L (ref 101–111)
GFR calc Af Amer: 9 mL/min — ABNORMAL LOW (ref >60)
GFR, EST NON AFRICAN AMERICAN: 8 mL/min — AB (ref >60)
GLUCOSE: 237 mg/dL — AB (ref 65–99)
Potassium: 4.8 mmol/L (ref 3.5–5.1)
SODIUM: 138 mmol/L (ref 135–145)

## 2018-03-01 LAB — GLUCOSE, CAPILLARY
GLUCOSE-CAPILLARY: 541 mg/dL — AB (ref 65–99)
GLUCOSE-CAPILLARY: 379 mg/dL — AB (ref 65–99)
GLUCOSE-CAPILLARY: 134 mg/dL — AB (ref 65–99)
Glucose-Capillary: 165 mg/dL — ABNORMAL HIGH (ref 65–99)
Glucose-Capillary: 203 mg/dL — ABNORMAL HIGH (ref 65–99)
Glucose-Capillary: >600 mg/dL (ref 65–99)

## 2018-03-01 LAB — GLUCOSE, RANDOM: Glucose, Bld: 231 mg/dL — ABNORMAL HIGH (ref 65–99)

## 2018-03-01 MED ORDER — INSULIN ASPART 100 UNIT/ML ~~LOC~~ SOLN
10.0000 [IU] | Freq: Once | SUBCUTANEOUS | Status: AC
  Administered 2018-03-01: 10 [IU] via SUBCUTANEOUS

## 2018-03-01 MED ORDER — CHLORHEXIDINE GLUCONATE CLOTH 2 % EX PADS: 6.0000 | MEDICATED_PAD | Freq: Every day | CUTANEOUS | Status: DC

## 2018-03-01 MED ORDER — INSULIN ASPART 100 UNIT/ML ~~LOC~~ SOLN
0.0000 [IU] | Freq: Three times a day (TID) | SUBCUTANEOUS | Status: DC
  Administered 2018-03-01: 7 [IU] via SUBCUTANEOUS
  Administered 2018-03-01: 4 [IU] via SUBCUTANEOUS
  Administered 2018-03-02 (×2): 3 [IU] via SUBCUTANEOUS
  Administered 2018-03-02: 11 [IU] via SUBCUTANEOUS
  Administered 2018-03-03: 3 [IU] via SUBCUTANEOUS
  Administered 2018-03-03: 7 [IU] via SUBCUTANEOUS
  Administered 2018-03-03: 11 [IU] via SUBCUTANEOUS
  Administered 2018-03-04: 4 [IU] via SUBCUTANEOUS
  Administered 2018-03-04: 3 [IU] via SUBCUTANEOUS

## 2018-03-01 MED ORDER — CHLORHEXIDINE GLUCONATE CLOTH 2 % EX PADS
6.0000 | MEDICATED_PAD | Freq: Every day | CUTANEOUS | Status: DC
  Administered 2018-03-01 – 2018-03-05 (×4): 6 via TOPICAL

## 2018-03-01 MED ORDER — PREDNISONE 20 MG PO TABS
60.0000 mg | ORAL_TABLET | Freq: Every day | ORAL | Status: DC
  Administered 2018-03-01 – 2018-03-14 (×12): 60 mg via ORAL
  Filled 2018-03-01 (×13): qty 3

## 2018-03-01 NOTE — Plan of Care (Signed)
Discussed with patient plan of care, pain management and eating grapes with a high glucose level with some teach back displayed

## 2018-03-01 NOTE — Progress Notes (Signed)
[facility] Progress Note    Assessment/ Plan:   1. 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 Of pulmonary hemorrhage vs BOOP (pulm has seen) 1. Started TPE, 1PV x7, Albumin on 6/12, 6/13, 6/15, 6/17, 6/19, 6/21 2. Started HD on 6/12, 6/13, 6/15, 6/18, 6/20.  Next HD 6/22. 3. Solumedrol Pulse started 6/11-6/13, now pred 60mg /d 4. Started cyclophosphamide 100mg /d 02/18/18, monitor for leukopenia- no dosage adjustments required as of yet 5. TMP/SMX DS for PCP prevention start 02/20/18 6. S/p tunneled [facility] 6/19/ s/p vein mapping, will c/s VVS for fistula placement.  Appreciate assistance.  Since 70% of glomeruli affected (although relatively little IFTA) I'm not sure how fast/ how much her recovery will be. 2. Cocaine use 6/5 UDS positive for cocaine 3. S pneumoniae PNA: has finished a course of CTX  4. 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  Rec OP followup, no bronch deemed necessary at this time 5. HCV Ab Positive -- can f/u as outpt for rx (possible contribution to fibrillary GN so will be important) 6. Dispo: pending finishing TPE, treatment #7 Monday, OP dialysis placement.   Subjective:    For HD today.  No complaints   Objective:   BP (!) 154/85 (BP Location: Right Arm)   Pulse 73   Temp 99 F (37.2 C) (Oral)   Resp 16   Ht 5\' 1"  (1.549 m)   Wt 70.1 kg (154 lb 8.7 oz)   SpO2 100%   BMI 29.20 kg/m   Intake/Output Summary (Last 24 hours) at 03/01/2018 1320 Last data filed at 03/01/2018 3557 Gross per 24 hour  Intake 744.05 ml  Output -  Net 744.05 ml   Weight change: -4.2 kg (-9 lb 4.2 oz)  Physical Exam: GEN: lying in bed, NAD CV: RRR PULM: some expiratory wheezing today EXT: trace edema noted No rashes/noted R IJ tunneled HD cath present    Imaging/ Studies: Renal Bx 6/10: active pauci immune GN with 70% cellular crescents and very little chronicity / fibrosing; EM with  Fibrillary GN as well  HR Chest CT 6/10: IMPRESSION: 1. The appearance of the lungs is nonspecific, but there are imaging findings that are suggestive of cryptogenic organizing pneumonia (COP). Other differential considerations include both cardiogenic and noncardiogenic edema, however, this is not strongly favored. 2. Aortic atherosclerosis.   Labs: BMET Recent Labs  Lab 02/23/18 0324 02/23/18 1331  02/25/18 0442 02/26/18 0432 02/26/18 1720 02/27/18 0305 02/28/18 0253 02/28/18 1540 03/01/18 0317  NA 137 136   < > 139 136 136 143 140 138 139  138  K 4.7 4.2   < > 5.9* 4.7 3.9 4.0 4.9 4.4 4.8  4.8  CL 103 101   < > 109 104 98* 109 106 101 107  107  CO2 23 22  --  18* 26  --  24 28  --  23  23  GLUCOSE 321* 172*   < > 228* 174* 110* 187* 184* 239* 238*  237*  BUN 62* 75*   < > 107* 60* 59* 76* 38* 42* 60*  60*  CREATININE 4.47* 4.74*   < > 7.09* 4.65* 4.80* 5.80* 3.57* 4.30* 5.19*  5.22*  CALCIUM 7.6* 7.8*  --  7.9* 8.0*  --  7.6* 7.9*  --  8.2*  8.1*  PHOS 6.9* 5.4*  --   --  4.0  --   --  3.2  --  3.4   < > =  values in this interval not displayed.   CBC Recent Labs  Lab 02/24/18 0257  02/26/18 0432  02/27/18 0305 02/28/18 0253 02/28/18 1540 03/01/18 0317  WBC 15.9*   < > 15.8*  --  13.2* 9.3  --  16.6*  NEUTROABS 11.5*  --   --   --  9.7*  --   --  14.9*  HGB 10.4*   < > 8.7*   < > 8.4* 8.3* 9.2* 8.0*  HCT 32.3*   < > 26.5*   < > 25.6* 25.6* 27.0* 24.4*  MCV 80.3   < > 78.9  --  79.8 79.5  --  79.5  PLT 105*   < > 111*  --  131* 120*  --  137*   < > = values in this interval not displayed.    Medications:    . Chlorhexidine Gluconate Cloth  6 each Topical Q0600  . cyclophosphamide  100 mg Oral Daily  . famotidine  20 mg Oral Daily  . heparin  1,000 Units Intracatheter Once  . insulin aspart  0-20 Units Subcutaneous TID WC  . insulin aspart  0-5 Units Subcutaneous QHS  . insulin aspart  3 Units Subcutaneous TID WC  . insulin glargine  15 Units  Subcutaneous Daily  . multivitamin with minerals  1 tablet Oral Daily  . predniSONE  60 mg Oral Q breakfast  . sodium chloride flush  3 mL Intravenous Q12H  . sulfamethoxazole-trimethoprim  1 tablet Oral Once per day on Mon Wed Fri  . thiamine  100 mg Oral Daily      Madelon Lips, MD 03/01/2018, 1:20 PM

## 2018-03-01 NOTE — Progress Notes (Signed)
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  She is progressed to end-stage renal disease and is currently going to dialysis, as well as plasmapheresis and cyclophosphamide treatment.   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  Unclear if related to underlying pulmonary process however this is not been completely evaluated.  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 -Nephrology consult -Continue Plex x7 -Status post Solu-Medrol on 6 11/2 through 02/20/2018, currently on prednisone 60 mg daily - Cyclophosphamide started on 02/18/2018  -On Bactrim PCP prophylaxis -Status post tunnel catheter placement by IR, vein mapping completed and vascular surgery consulted by nephrology for possible fistula or graft placement during this hospitalization  #) Pulmonary infiltrates: Per pulmonary  diffuse alveolar hemorrhage versus bronchiolitis obliterans or cryptogenic organizing pneumonia.  Currently she appears to be stable from this perspective and is not received bronchoscopy.  It is unclear if this is related to her RPGN. - Pulmonary following appreciate recommendations  #) Sepsis secondary to streptococcal pneumonia: Urine antigen was only positive.  The -Completed course of antibiotics, resolved -Blood cultures from 02/12/2018 no growth to date  #) HCV antibody positive: Patient can follow-up as an outpatient for this.  Per the nephrology note the biopsy showed evidence of fibrillary, you nephritis that could be possible HCV related.  #) Cocaine abuse: Patient's UDS was positive for cocaine on admission. - counseling provided  Fluids: Restricted Letter lites: Monitor and supplement Nutrition: Renal diet  Prophylaxis: Subcu heparin  Disposition: Pending outpatient dialysis and completion of plasmapheresis  Full code    Consultants:   Nephrology  Interventional radiology  Pulmonology  Vascular surgery  Procedures:   Therapeutic plasma exchange on 02/18/2018, 02/20/2018, 02/22/2018, 02/24/2018, 02/26/2018  02/26/2018 IR guided tunneled catheter for dialysis  Antimicrobials:   None currently   Subjective: Patient was quite anxious last night and received lorazepam.  She has been sleeping quite deeply throughout the morning.  She does awake when woken to and reports that she does not have any complaints at this time.  Objective: Vitals:   02/28/18 1629 02/28/18 1640 03/01/18 0008 03/01/18 0449  BP: 109/90 114/68 (!) 163/92   Pulse:   76   Resp: (!) 22 15 18    Temp: 99.1 F (37.3 C) 99.1 F (37.3 C) 98 F (36.7 C)   TempSrc: Oral Oral Oral   SpO2:  100% 100%   Weight:    70.1 kg (154 lb 8.7 oz)  Height:  Intake/Output Summary (Last 24 hours) at 03/01/2018 0936 Last data filed at 02/28/2018 2300 Gross per 24 hour  Intake 504.05 ml  Output 1 ml    Net 503.05 ml   Filed Weights   02/27/18 1150 02/28/18 0500 03/01/18 0449  Weight: 72.1 kg (158 lb 15.2 oz) 74.6 kg (164 lb 7.4 oz) 70.1 kg (154 lb 8.7 oz)    Examination:  General exam: Sleeping in bed Respiratory system: No increased work of breathing, diminished lung sounds at bases, dry crackles at bases, no wheezes or rhonchi Cardiovascular system: Distant heart sounds, regular rate and rhythm, no murmurs Gastrointestinal system: Abdomen is nondistended, soft and nontender. No organomegaly or masses felt. Normal bowel sounds heard. Central nervous system: Alert and oriented. No focal neurological deficits. Extremities: no lower extremity edema. Skin: Tunneled catheter site is clean dry and intact Psychiatry: Judgement and insight appear normal. Mood & affect appropriate.     Data Reviewed: I have personally reviewed following labs and imaging studies  CBC: Recent Labs  Lab 02/24/18 0257  02/25/18 0442 02/26/18 0432 02/26/18 1720 02/27/18 0305 02/28/18 0253 02/28/18 1540 03/01/18 0317  WBC 15.9*  --  11.7* 15.8*  --  13.2* 9.3  --  16.6*  NEUTROABS 11.5*  --   --   --   --  9.7*  --   --  14.9*  HGB 10.4*   < > 9.4* 8.7* 8.8* 8.4* 8.3* 9.2* 8.0*  HCT 32.3*   < > 29.0* 26.5* 26.0* 25.6* 25.6* 27.0* 24.4*  MCV 80.3  --  79.7 78.9  --  79.8 79.5  --  79.5  PLT 105*  --  99* 111*  --  131* 120*  --  137*   < > = values in this interval not displayed.   Basic Metabolic Panel: Recent Labs  Lab 02/23/18 0324 02/23/18 1331  02/25/18 0442 02/26/18 0432 02/26/18 1720 02/27/18 0305 02/28/18 0253 02/28/18 1540 03/01/18 0317  NA 137 136   < > 139 136 136 143 140 138 139  138  K 4.7 4.2   < > 5.9* 4.7 3.9 4.0 4.9 4.4 4.8  4.8  CL 103 101   < > 109 104 98* 109 106 101 107  107  CO2 23 22  --  18* 26  --  24 28  --  23  23  GLUCOSE 321* 172*   < > 228* 174* 110* 187* 184* 239* 238*  237*  BUN 62* 75*   < > 107* 60* 59* 76* 38* 42* 60*  60*  CREATININE 4.47*  4.74*   < > 7.09* 4.65* 4.80* 5.80* 3.57* 4.30* 5.19*  5.22*  CALCIUM 7.6* 7.8*  --  7.9* 8.0*  --  7.6* 7.9*  --  8.2*  8.1*  MG  --   --   --   --   --   --  1.8  --   --   --   PHOS 6.9* 5.4*  --   --  4.0  --   --  3.2  --  3.4   < > = values in this interval not displayed.   GFR: Estimated Creatinine Clearance: 9.7 mL/min (A) (by C-G formula based on SCr of 5.22 mg/dL (H)). Liver Function Tests: Recent Labs  Lab 02/23/18 0324 02/23/18 1331 02/26/18 0432 02/28/18 0253 03/01/18 0317  ALBUMIN 3.4* 3.6 3.3* 3.4* 3.9   No results for input(s): LIPASE, AMYLASE in the last 168 hours. No results for  input(s): AMMONIA in the last 168 hours. Coagulation Profile: Recent Labs  Lab 02/25/18 1559  INR 1.20   Cardiac Enzymes: No results for input(s): CKTOTAL, CKMB, CKMBINDEX, TROPONINI in the last 168 hours. BNP (last 3 results) No results for input(s): PROBNP in the last 8760 hours. HbA1C: No results for input(s): HGBA1C in the last 72 hours. CBG: Recent Labs  Lab 02/28/18 0855 02/28/18 1215 02/28/18 1721 02/28/18 2126 03/01/18 0806  GLUCAP 297* 120* 299* 240* 165*   Lipid Profile: No results for input(s): CHOL, HDL, LDLCALC, TRIG, CHOLHDL, LDLDIRECT in the last 72 hours. Thyroid Function Tests: No results for input(s): TSH, T4TOTAL, FREET4, T3FREE, THYROIDAB in the last 72 hours. Anemia Panel: No results for input(s): VITAMINB12, FOLATE, FERRITIN, TIBC, IRON, RETICCTPCT in the last 72 hours. Sepsis Labs: No results for input(s): PROCALCITON, LATICACIDVEN in the last 168 hours.  No results found for this or any previous visit (from the past 240 hour(s)).       Radiology Studies: No results found.      Scheduled Meds: . Chlorhexidine Gluconate Cloth  6 each Topical Q0600  . cyclophosphamide  100 mg Oral Daily  . famotidine  20 mg Oral Daily  . heparin  1,000 Units Intracatheter Once  . insulin aspart  0-20 Units Subcutaneous TID WC  . insulin aspart  0-5  Units Subcutaneous QHS  . insulin aspart  3 Units Subcutaneous TID WC  . insulin glargine  15 Units Subcutaneous Daily  . multivitamin with minerals  1 tablet Oral Daily  . predniSONE  60 mg Oral Q breakfast  . sodium chloride flush  3 mL Intravenous Q12H  . sulfamethoxazole-trimethoprim  1 tablet Oral Once per day on Mon Wed Fri  . thiamine  100 mg Oral Daily   Continuous Infusions: .  ceFAZolin (ANCEF) IV    . citrate dextrose 500 mL (02/28/18 1611)     LOS: 17 days    Time spent: [place], MD [facility]   If 7PM-7AM, please contact night-coverage [email or web address] Password [facility] 03/01/2018, 9:36 AM

## 2018-03-01 NOTE — Progress Notes (Signed)
Patient's HD dressing oozing around biopatch when touching.  IV Team consulted and advised not to change the dressing.  The patient is going for dialysis tomorrow and will have MD address in AM.

## 2018-03-01 NOTE — Progress Notes (Signed)
CRITICAL VALUE ALERT  Critical Value:  Glucose 541  Date & Time Notied:  6/22 & 1859  Provider Notified: Dr. Herbert Moors   Orders Received/Actions taken: 10 units of Insulin administered

## 2018-03-02 LAB — RENAL FUNCTION PANEL
Albumin: 3.5 g/dL (ref 3.5–5.0)
Anion gap: 7 (ref 5–15)
BUN: 34 mg/dL — ABNORMAL HIGH (ref 6–20)
CO2: 28 mmol/L (ref 22–32)
Calcium: 8.1 mg/dL — ABNORMAL LOW (ref 8.9–10.3)
Chloride: 104 mmol/L (ref 101–111)
Creatinine, Ser: 3.55 mg/dL — ABNORMAL HIGH (ref 0.44–1.00)
GFR calc Af Amer: 15 mL/min — ABNORMAL LOW (ref >60)
GFR calc non Af Amer: 13 mL/min — ABNORMAL LOW (ref >60)
Glucose, Bld: 134 mg/dL — ABNORMAL HIGH (ref 65–99)
Phosphorus: 4.1 mg/dL (ref 2.5–4.6)
Potassium: 4.1 mmol/L (ref 3.5–5.1)
Sodium: 139 mmol/L (ref 135–145)

## 2018-03-02 LAB — IRON AND TIBC
Iron: 82 ug/dL (ref 28–170)
Saturation Ratios: 82 % — ABNORMAL HIGH (ref 10.4–31.8)
TIBC: 99 ug/dL — ABNORMAL LOW (ref 250–450)
UIBC: 17 ug/dL

## 2018-03-02 LAB — GLUCOSE, CAPILLARY
GLUCOSE-CAPILLARY: 130 mg/dL — AB (ref 65–99)
Glucose-Capillary: 128 mg/dL — ABNORMAL HIGH (ref 65–99)
Glucose-Capillary: 279 mg/dL — ABNORMAL HIGH (ref 65–99)
Glucose-Capillary: 262 mg/dL — ABNORMAL HIGH (ref 65–99)

## 2018-03-02 LAB — FERRITIN: Ferritin: 320 ng/mL — ABNORMAL HIGH (ref 11–307)

## 2018-03-02 NOTE — Progress Notes (Signed)
[name], female   DOB: March 20, 1956, 62 y.o.   MRN: [id number] Vein map reviewed.  Does appear to have adequate surface veins for left arm fistula attempt.  We will make that determination and the operating room.  We will plan for left arm access Vedha Tercero this week if still inpatient.  Probably Tuesday depending on the operative schedule.

## 2018-03-02 NOTE — Plan of Care (Signed)
Discussed with patient plan of care, pain management and taking sleeping pill this evening with some teach back displayed

## 2018-03-02 NOTE — Progress Notes (Signed)
[facility] Progress Note    Assessment/ Plan:   1. 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 Of pulmonary hemorrhage vs BOOP (pulm has seen) 1. Started TPE, 1PV x7, Albumin on 6/12, 6/13, 6/15, 6/17, 6/19, 6/21.  TPE #7 6/24.   2. Started HD on 6/12, 6/13, 6/15, 6/18, 6/20,  6/22. 3. Solumedrol Pulse started 6/11-6/13, now pred 60mg /d 4. Started cyclophosphamide 100mg /d 02/18/18, monitor for leukopenia- no dosage adjustments required as of yet 5. TMP/SMX DS for PCP prevention start 02/20/18 6. S/p tunneled [facility] 6/19/ s/p vein mapping, c/s VVS for fistula placement.  Appreciate assistance.  Since 70% of glomeruli affected (although relatively little IFTA) I'm not sure how fast/ how much her recovery will be. 2. Cocaine use 6/5 UDS positive for cocaine 3. S pneumoniae PNA: has finished a course of CTX  4. 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  Rec OP followup, no bronch deemed necessary at this time 5. HCV Ab Positive -- can f/u as outpt for rx (possible contribution to fibrillary GN so will be important) 6. Dispo: pending finishing TPE, treatment #7 Monday, OP dialysis placement.   Subjective:    Feeling OK.  Happy to have a break from dialysis and TPE today.     Objective:   BP (!) 153/74 (BP Location: Left Arm)   Pulse (!) 59   Temp 98.6 F (37 C) (Oral)   Resp 17   Ht 5\' 1"  (1.549 m)   Wt 73.3 kg (161 lb 9.6 oz)   SpO2 97%   BMI 30.53 kg/m   Intake/Output Summary (Last 24 hours) at 03/02/2018 1315 Last data filed at 03/02/2018 1029 Gross per 24 hour  Intake 490 ml  Output 2001 ml  Net -1511 ml   Weight change: 4.4 kg (9 lb 11.2 oz)  Physical Exam: GEN: lying in bed, NAD HEENT: very poor dentition CV: RRR PULM: clear bilaterally  EXT: trace edema noted No rashes/noted R IJ tunneled HD cath present    Imaging/ Studies: Renal Bx 6/10: active pauci immune GN with 70% cellular  crescents and very little chronicity / fibrosing; EM with Fibrillary GN as well  HR Chest CT 6/10: IMPRESSION: 1. The appearance of the lungs is nonspecific, but there are imaging findings that are suggestive of cryptogenic organizing pneumonia (COP). Other differential considerations include both cardiogenic and noncardiogenic edema, however, this is not strongly favored. 2. Aortic atherosclerosis.   Labs: BMET Recent Labs  Lab 02/23/18 1331  02/25/18 0442 02/26/18 0432 02/26/18 1720 02/27/18 0305 02/28/18 0253 02/28/18 1540 03/01/18 0317 03/01/18 2110 03/02/18 0355  NA 136   < > 139 136 136 143 140 138 139  138 135 139  K 4.2   < > 5.9* 4.7 3.9 4.0 4.9 4.4 4.8  4.8 3.6 4.1  CL 101   < > 109 104 98* 109 106 101 107  107 100* 104  CO2 22  --  18* 26  --  24 28  --  23  23 25 28   GLUCOSE 172*   < > 228* 174* 110* 187* 184* 239* 238*  237* 231*  231* 134*  BUN 75*   < > 107* 60* 59* 76* 38* 42* 60*  60* 26* 34*  CREATININE 4.74*   < > 7.09* 4.65* 4.80* 5.80* 3.57* 4.30* 5.19*  5.22* 3.00* 3.55*  CALCIUM 7.8*  --  7.9* 8.0*  --  7.6* 7.9*  --  8.2*  8.1* 7.9* 8.1*  PHOS 5.4*  --   --  4.0  --   --  3.2  --  3.4 3.1 4.1   < > = values in this interval not displayed.   CBC Recent Labs  Lab 02/24/18 0257  02/26/18 0432  02/27/18 0305 02/28/18 0253 02/28/18 1540 03/01/18 0317  WBC 15.9*   < > 15.8*  --  13.2* 9.3  --  16.6*  NEUTROABS 11.5*  --   --   --  9.7*  --   --  14.9*  HGB 10.4*   < > 8.7*   < > 8.4* 8.3* 9.2* 8.0*  HCT 32.3*   < > 26.5*   < > 25.6* 25.6* 27.0* 24.4*  MCV 80.3   < > 78.9  --  79.8 79.5  --  79.5  PLT 105*   < > 111*  --  131* 120*  --  137*   < > = values in this interval not displayed.    Medications:    . Chlorhexidine Gluconate Cloth  6 each Topical Q0600  . cyclophosphamide  100 mg Oral Daily  . famotidine  20 mg Oral Daily  . insulin aspart  0-20 Units Subcutaneous TID WC  . insulin aspart  0-5 Units Subcutaneous QHS  . insulin  aspart  3 Units Subcutaneous TID WC  . insulin glargine  15 Units Subcutaneous Daily  . multivitamin with minerals  1 tablet Oral Daily  . predniSONE  60 mg Oral Q breakfast  . sodium chloride flush  3 mL Intravenous Q12H  . sulfamethoxazole-trimethoprim  1 tablet Oral Once per day on Mon Wed Fri  . thiamine  100 mg Oral Daily      Madelon Lips, MD 03/02/2018, 1:15 PM

## 2018-03-02 NOTE — Progress Notes (Signed)
PROGRESS NOTE    Rhonda Lynch  WYO:[id number] DOB: 1955/09/12 DOA: 02/12/2018 PCP: Patient, No Pcp Per     Brief Narrative: 62 year old woman with past medical history relevant cocaine and alcohol abuse admitted on 02/12/2018 with abdominal pain, nausea, vomiting found to be septic secondary to Streptococcus pneumonia (only urinary antigen positive, no blood cultures), and found to have AKI likely secondary to MPO positive rapidly progressive glomerulonephritis and pulmonary evidence of diffuse alveolar hemorrhage versus bronchiolitis obliterans with organizing pneumonia/cryptogenic organizing pneumonia.  She is progressed to end-stage renal disease and is currently going to dialysis, as well as plasmapheresis and cyclophosphamide treatment.   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 was positive.  Unclear if related to underlying pulmonary process however this is not been completely evaluated.  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 -Nephrology consult -Continue Plex x7 (currently on 6/7) -Status post Solu-Medrol on 6 11/2 through 02/20/2018, currently on prednisone 60 mg daily - Cyclophosphamide started on 02/18/2018  -On Bactrim PCP prophylaxis -Status post tunnel catheter placement by IR -Vein mapping completed and vascular surgery suggests possible OR on 03/04/2018  #) Pulmonary infiltrates: Per pulmonary diffuse alveolar hemorrhage versus  bronchiolitis obliterans or cryptogenic organizing pneumonia.  Currently she appears to be stable from this perspective and is not received bronchoscopy.  It is unclear if this is related to her RPGN. - Pulmonary following appreciate recommendations  #) Hyperglycemia: Likely secondary to high-dose steroids.  Patient was quite hyperglycemic. -Check hemoglobin A1c -Concerning sliding scale insulin -Continue glargine 15 units nightly -Continue aspart 3 units 3 times daily with meals  #) Sepsis secondary to streptococcal pneumonia: Urine antigen was only positive.  The -Completed course of antibiotics, resolved -Blood cultures from 02/12/2018 no growth to date  #) HCV antibody positive: Patient can follow-up as an outpatient for this.  Per the nephrology note the biopsy showed evidence of fibrillary, you nephritis that could be possible HCV related.  #) Cocaine abuse: Patient's UDS was positive for cocaine on admission. - counseling provided  Fluids: Restricted Letter lites: Monitor and supplement Nutrition: Renal diet  Prophylaxis: Subcu heparin  Disposition: Pending outpatient dialysis and completion of plasmapheresis  Full code    Consultants:   Nephrology  Interventional radiology  Pulmonology  Vascular surgery  Procedures:   Therapeutic plasma exchange on 02/18/2018, 02/20/2018, 02/22/2018, 02/24/2018, 02/26/2018  02/26/2018 IR guided tunneled catheter for dialysis  Antimicrobials:   None currently   Subjective: Patient is doing well but she is quite sleepy today.  She was hyperglycemic after dialysis yesterday with a blood glucose of over 600.  Renal function panel during that time showed no evidence of DKA.  She has received 10 units of insulin with good control of her blood sugars.  Objective: Vitals:   03/01/18 1730 03/01/18 2256 03/02/18 0412 03/02/18 0815  BP: (!) 176/83 (!) 162/80  (!) 153/74  Pulse: 72 63  (!) 59  Resp: 16 18  17  Temp: (!) 97.4 F (36.3  C) 99.3 F (37.4 C)  98.6 F (37 C)  TempSrc: Oral Oral  Oral  SpO2: 98% 98%  97%  Weight: 72.3 kg (159 lb 6.3 oz)  73.3 kg (161 lb 9.6 oz)   Height:        Intake/Output Summary (Last 24 hours) at 03/02/2018 0958 Last data filed at 03/02/2018 0300 Gross per 24 hour  Intake 370 ml  Output 2000 ml  Net -1630 ml   Filed Weights   03/01/18 1330 03/01/18 1730 03/02/18 0412  Weight: 74.5 kg (164 lb 3.9 oz) 72.3 kg (159 lb 6.3 oz) 73.3 kg (161 lb 9.6 oz)    Examination:  General exam: No acute distress Respiratory system: No increased work of breathing, diminished lung sounds at bases, dry crackles at bases, no wheezes or rhonchi Cardiovascular system: Distant heart sounds, regular rate and rhythm, no murmurs Gastrointestinal system: Abdomen is nondistended, soft and nontender. No organomegaly or masses felt. Normal bowel sounds heard. Central nervous system: Alert and oriented. No focal neurological deficits. Extremities: no lower extremity edema. Skin: Tunneled catheter site is clean dry and intact Psychiatry: Judgement and insight appear normal. Mood & affect appropriate.     Data Reviewed: I have personally reviewed following labs and imaging studies  CBC: Recent Labs  Lab 02/24/18 0257  02/25/18 0442 02/26/18 0432 02/26/18 1720 02/27/18 0305 02/28/18 0253 02/28/18 1540 03/01/18 0317  WBC 15.9*  --  11.7* 15.8*  --  13.2* 9.3  --  16.6*  NEUTROABS 11.5*  --   --   --   --  9.7*  --   --  14.9*  HGB 10.4*   < > 9.4* 8.7* 8.8* 8.4* 8.3* 9.2* 8.0*  HCT 32.3*   < > 29.0* 26.5* 26.0* 25.6* 25.6* 27.0* 24.4*  MCV 80.3  --  79.7 78.9  --  79.8 79.5  --  79.5  PLT 105*  --  99* 111*  --  131* 120*  --  137*   < > = values in this interval not displayed.   Basic Metabolic Panel: Recent Labs  Lab 02/26/18 0432  02/27/18 0305 02/28/18 0253 02/28/18 1540 03/01/18 0317 03/01/18 2110 03/02/18 0355  NA 136   < > 143 140 138 139  138 135 139  K 4.7   < > 4.0 4.9 4.4  4.8  4.8 3.6 4.1  CL 104   < > 109 106 101 107  107 100* 104  CO2 26  --  24 28  --  23  23 25 28   GLUCOSE 174*   < > 187* 184* 239* 238*  237* 231*  231* 134*  BUN 60*   < > 76* 38* 42* 60*  60* 26* 34*  CREATININE 4.65*   < > 5.80* 3.57* 4.30* 5.19*  5.22* 3.00* 3.55*  CALCIUM 8.0*  --  7.6* 7.9*  --  8.2*  8.1* 7.9* 8.1*  MG  --   --  1.8  --   --   --   --   --   PHOS 4.0  --   --  3.2  --  3.4 3.1 4.1   < > = values in this interval not displayed.   GFR: Estimated Creatinine Clearance: 14.7 mL/min (A) (by C-G formula based on SCr of 3.55 mg/dL (H)). Liver Function Tests: Recent Labs  Lab 02/26/18 0432 02/28/18 0253 03/01/18 0317 03/01/18 2110 03/02/18 0355  ALBUMIN 3.3* 3.4*  3.9 3.5 3.5   No results for input(s): LIPASE, AMYLASE in the last 168 hours. No results for input(s): AMMONIA in the last 168 hours. Coagulation Profile: Recent Labs  Lab 02/25/18 1559  INR 1.20   Cardiac Enzymes: No results for input(s): CKTOTAL, CKMB, CKMBINDEX, TROPONINI in the last 168 hours. BNP (last 3 results) No results for input(s): PROBNP in the last 8760 hours. HbA1C: No results for input(s): HGBA1C in the last 72 hours. CBG: Recent Labs  Lab 03/01/18 1851 03/01/18 1852 03/01/18 2045 03/01/18 2255 03/02/18 0817  GLUCAP 541* >600* 379* 134* 128*   Lipid Profile: No results for input(s): CHOL, HDL, LDLCALC, TRIG, CHOLHDL, LDLDIRECT in the last 72 hours. Thyroid Function Tests: No results for input(s): TSH, T4TOTAL, FREET4, T3FREE, THYROIDAB in the last 72 hours. Anemia Panel: Recent Labs    03/02/18 0355  FERRITIN 320*  TIBC 99*  IRON 82   Sepsis Labs: No results for input(s): PROCALCITON, LATICACIDVEN in the last 168 hours.  No results found for this or any previous visit (from the past 240 hour(s)).       Radiology Studies: No results found.      Scheduled Meds: . Chlorhexidine Gluconate Cloth  6 each Topical Q0600  . cyclophosphamide  100 mg  Oral Daily  . famotidine  20 mg Oral Daily  . insulin aspart  0-20 Units Subcutaneous TID WC  . insulin aspart  0-5 Units Subcutaneous QHS  . insulin aspart  3 Units Subcutaneous TID WC  . insulin glargine  15 Units Subcutaneous Daily  . multivitamin with minerals  1 tablet Oral Daily  . predniSONE  60 mg Oral Q breakfast  . sodium chloride flush  3 mL Intravenous Q12H  . sulfamethoxazole-trimethoprim  1 tablet Oral Once per day on Mon Wed Fri  . thiamine  100 mg Oral Daily   Continuous Infusions: .  ceFAZolin (ANCEF) IV       LOS: 18 days    Time spent: [place], MD [facility]   If 7PM-7AM, please contact night-coverage [email or web address] Password [facility] 03/02/2018, 9:58 AM

## 2018-03-03 LAB — RENAL FUNCTION PANEL
Albumin: 3.2 g/dL — ABNORMAL LOW (ref 3.5–5.0)
Anion gap: 8 (ref 5–15)
BUN: 63 mg/dL — ABNORMAL HIGH (ref 6–20)
CO2: 26 mmol/L (ref 22–32)
Calcium: 8 mg/dL — ABNORMAL LOW (ref 8.9–10.3)
Chloride: 103 mmol/L (ref 101–111)
Creatinine, Ser: 5.22 mg/dL — ABNORMAL HIGH (ref 0.44–1.00)
GFR calc Af Amer: 9 mL/min — ABNORMAL LOW (ref >60)
GFR calc non Af Amer: 8 mL/min — ABNORMAL LOW (ref >60)
Glucose, Bld: 201 mg/dL — ABNORMAL HIGH (ref 65–99)
Phosphorus: 3.6 mg/dL (ref 2.5–4.6)
Potassium: 4.1 mmol/L (ref 3.5–5.1)
Sodium: 137 mmol/L (ref 135–145)

## 2018-03-03 LAB — GLUCOSE, CAPILLARY
GLUCOSE-CAPILLARY: 209 mg/dL — AB (ref 65–99)
GLUCOSE-CAPILLARY: 267 mg/dL — AB (ref 65–99)
Glucose-Capillary: 128 mg/dL — ABNORMAL HIGH (ref 65–99)

## 2018-03-03 LAB — CBC
HCT: 22.5 % — ABNORMAL LOW (ref 36.0–46.0)
Hemoglobin: 7.5 g/dL — ABNORMAL LOW (ref 12.0–15.0)
MCH: 26.6 pg (ref 26.0–34.0)
MCHC: 33.3 g/dL (ref 30.0–36.0)
MCV: 79.8 fL (ref 78.0–100.0)
Platelets: 142 10*3/uL — ABNORMAL LOW (ref 150–400)
RBC: 2.82 MIL/uL — ABNORMAL LOW (ref 3.87–5.11)
RDW: 20.5 % — ABNORMAL HIGH (ref 11.5–15.5)
WBC: 12.7 10*3/uL — ABNORMAL HIGH (ref 4.0–10.5)

## 2018-03-03 LAB — HEMOGLOBIN A1C
Hgb A1c MFr Bld: 6.8 % — ABNORMAL HIGH (ref 4.8–5.6)
Mean Plasma Glucose: 148.46 mg/dL

## 2018-03-03 MED ORDER — HEPARIN SODIUM (PORCINE) 1000 UNIT/ML IJ SOLN
1000.0000 [IU] | Freq: Once | INTRAMUSCULAR | Status: DC
  Filled 2018-03-03: qty 1

## 2018-03-03 MED ORDER — CALCIUM GLUCONATE 10 % IV SOLN
2.0000 g | Freq: Once | INTRAVENOUS | Status: AC
  Administered 2018-03-03: 2 g via INTRAVENOUS
  Filled 2018-03-03: qty 20

## 2018-03-03 MED ORDER — ACD FORMULA A 0.73-2.45-2.2 GM/100ML VI SOLN
Status: AC
  Administered 2018-03-03: 500 mL via INTRAVENOUS
  Filled 2018-03-03: qty 500

## 2018-03-03 MED ORDER — ACETAMINOPHEN 325 MG PO TABS: 650.0000 mg | ORAL_TABLET | ORAL | Status: DC | PRN

## 2018-03-03 MED ORDER — ACD FORMULA A 0.73-2.45-2.2 GM/100ML VI SOLN
500.0000 mL | Status: DC
  Administered 2018-03-03: 500 mL via INTRAVENOUS
  Filled 2018-03-03: qty 500

## 2018-03-03 MED ORDER — CALCIUM CARBONATE ANTACID 500 MG PO CHEW
CHEWABLE_TABLET | ORAL | Status: AC
  Filled 2018-03-03: qty 2

## 2018-03-03 MED ORDER — DIPHENHYDRAMINE HCL 25 MG PO CAPS: 25.0000 mg | ORAL_CAPSULE | Freq: Four times a day (QID) | ORAL | Status: DC | PRN

## 2018-03-03 MED ORDER — SODIUM CHLORIDE 0.9 % IV SOLN: Freq: Once | INTRAVENOUS | Status: DC

## 2018-03-03 MED ORDER — SODIUM CHLORIDE 0.9 % IV SOLN
INTRAVENOUS | Status: AC
  Administered 2018-03-03 (×3): via INTRAVENOUS_CENTRAL
  Filled 2018-03-03 (×3): qty 200

## 2018-03-03 MED ORDER — CALCIUM CARBONATE ANTACID 500 MG PO CHEW
2.0000 | CHEWABLE_TABLET | ORAL | Status: AC
  Administered 2018-03-03 (×2): 400 mg via ORAL
  Filled 2018-03-03 (×2): qty 2

## 2018-03-03 NOTE — Progress Notes (Signed)
[facility] NEPHROLOGY PROGRESS NOTE  Assessment/ Plan: Pt is a 62 y.o. yo female with history of cocaine and alcohol abuse admitted with abdominal pain, nausea vomiting due to septic secondary to Streptococcus pneumonia.  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  Seen by pulmonologist. -Started TPE, Albumin on 6/12, 6/13, 6/15, 6/17, 6/19, 6/21 and today.(7th TPE). -Started hemodialysis on 6/12.  Last dialysis on 6/22.  Serum creatinine level increased to 5.2 today.  Monitor BMP and urine output.  Has urine output of only 400 cc in 24 hours. -Patient received Solu-Medrol pulse from 6/11 to 6/13, now on prednisone 60 mill grams daily. -Started cyclophosphamide 100 mg daily since 02/18/2018.  Monitor for leukopenia. -On Bactrim for PCP prophylaxis -Status post tunneled catheter placement on 6/19.  Patient had a vein mapping.  Vascular surgery was consulted for fistula placement. -The biopsy showed 70% of the glomeruli affected although mild IFTA, unknown how much renal recovery patient will have.  #Polysubstance abuse: Urine positive for cocaine.  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  # HCV Ab positive: Recommended to follow-up outpatient for possible treatment.  #Dispo:  plasma exchange today.  Needs outpatient dialysis arrangement.  Subjective: Seen and examined at bedside.  Denies headache, dizziness, nausea vomiting. Objective Vital signs in last 24 hours: Vitals:   03/02/18 1729 03/03/18 0058 03/03/18 0333 03/03/18 0716  BP: (!) 144/90 (!) 184/96 (!) 160/76 (!) 162/88  Pulse: 85 64 67 67  Resp: 17 (!) 22  16  Temp: 98.8 F (37.1 C) 99.4 F (37.4  C)  97.6 F (36.4 C)  TempSrc: Oral Oral  Oral  SpO2: 97% 100% 96% 95%  Weight:      Height:       Weight change:   Intake/Output Summary (Last 24 hours) at 03/03/2018 1103 Last data filed at 03/03/2018 0300 Gross per 24 hour  Intake 430 ml  Output 400 ml  Net 30 ml       Labs: Basic Metabolic Panel: Recent Labs  Lab 03/01/18 2110 03/02/18 0355 03/03/18 0427  NA 135 139 137  K 3.6 4.1 4.1  CL 100* 104 103  CO2 25 28 26   GLUCOSE 231*  231* 134* 201*  BUN 26* 34* 63*  CREATININE 3.00* 3.55* 5.22*  CALCIUM 7.9* 8.1* 8.0*  PHOS 3.1 4.1 3.6   Liver Function Tests: Recent Labs  Lab 03/01/18 2110 03/02/18 0355 03/03/18 0427  ALBUMIN 3.5 3.5 3.2*   No results for input(s): LIPASE, AMYLASE in the last 168 hours. No results for input(s): AMMONIA in the last 168 hours. CBC: Recent Labs  Lab 02/26/18 0432  02/27/18 0305 02/28/18 0253 02/28/18 1540 03/01/18 0317 03/03/18 0427  WBC 15.8*  --  13.2* 9.3  --  16.6* 12.7*  NEUTROABS  --   --  9.7*  --   --  14.9*  --   HGB 8.7*   < > 8.4* 8.3* 9.2* 8.0* 7.5*  HCT 26.5*   < > 25.6* 25.6* 27.0* 24.4* 22.5*  MCV 78.9  --  79.8 79.5  --  79.5 79.8  PLT 111*  --  131* 120*  --  137* 142*   < > = values in this interval not displayed.  Cardiac Enzymes: No results for input(s): CKTOTAL, CKMB, CKMBINDEX, TROPONINI in the last 168 hours. CBG: Recent Labs  Lab 03/02/18 0817 03/02/18 1220 03/02/18 1730 03/02/18 2046 03/03/18 0714  GLUCAP 128* 130* 279* 262* 128*    Iron Studies:  Recent Labs    03/02/18 0355  IRON 82  TIBC 99*  FERRITIN 320*   Studies/Results: No results found.  Medications: Infusions: .  ceFAZolin (ANCEF) IV      Scheduled Medications: . Chlorhexidine Gluconate Cloth  6 each Topical Q0600  . cyclophosphamide  100 mg Oral Daily  . famotidine  20 mg Oral Daily  . insulin aspart  0-20 Units Subcutaneous TID WC  . insulin aspart  0-5 Units Subcutaneous QHS  . insulin aspart  3  Units Subcutaneous TID WC  . insulin glargine  15 Units Subcutaneous Daily  . multivitamin with minerals  1 tablet Oral Daily  . predniSONE  60 mg Oral Q breakfast  . sodium chloride flush  3 mL Intravenous Q12H  . sulfamethoxazole-trimethoprim  1 tablet Oral Once per day on Mon Wed Fri  . thiamine  100 mg Oral Daily    have reviewed scheduled and prn medications.  Physical Exam: General:NAD, comfortable Heart:RRR, s1s2 nl Lungs:clear b/l, no crackle Abdomen:soft, Non-tender, non-distended Extremities:No edema Dialysis Access: RIJ Tunneled catheter present.  Khori Underberg Prasad Amadi Yoshino 03/03/2018,11:03 AM  LOS: 19 days

## 2018-03-03 NOTE — Progress Notes (Signed)
Inpatient Diabetes Program Recommendations  AACE/ADA: New Consensus Statement on Inpatient Glycemic Control (2015)  Target Ranges:  Prepandial:   less than 140 mg/dL      Peak postprandial:   less than 180 mg/dL (1-2 hours)      Critically ill patients:  140 - 180 mg/dL   Lab Results  Component Value Date   GLUCAP 209 (H) 03/03/2018   HGBA1C 6.8 (H) 03/03/2018    Review of Glycemic ControlResults for [name], [name] (MRN [id number]) as of 03/03/2018 13:54  Ref. Range 03/02/2018 12:20 03/02/2018 17:30 03/02/2018 20:46 03/03/2018 07:14 03/03/2018 11:24  Glucose-Capillary Latest Ref Range: 65 - 99 mg/dL 130 (H) 279 (H) 262 (H) 128 (H) 209 (H)    Diabetes history: None Outpatient Diabetes medications: None Current orders for Inpatient glycemic control:  Lantus 15 units daily, Novolog resistant tid with meals and HS, Novolog 3 units tid with meals Prednisone 60 mg daily Inpatient Diabetes Program Recommendations:    Blood sugars continue to be elevated with steroids however improved.  May consider slight increase in Novolog to 5 units tid with meals while on Prednisone.  ? Will patient need insulin at d/c once steroids titrated.  Thanks,  Adah Perl, RN, BC-ADM Inpatient Diabetes Coordinator Pager [phone] (8a-5p)

## 2018-03-03 NOTE — Plan of Care (Signed)
Discussed with patient plan of care for the evening, pain management, eating outside food effecting blood sugars with some tach back displayed.  Observed leaking around biopatch for HD cath dressing but MD Aware and had looked at site in dialysis a day ago.

## 2018-03-03 NOTE — Progress Notes (Signed)
Pt was complaining of pain in her HD site, dilaudid given with relief.    1715:  Pt picked up for dialysis by bed.

## 2018-03-03 NOTE — Progress Notes (Signed)
Physical Therapy Treatment Patient Details Name: Rhonda Lynch MRN: [id number] DOB: 1956/01/29 Today's Date: 03/03/2018    History of Present Illness Pt is a 62 yo female presenting with epigastric pain and was tachypneic. PMH: cocaine and alcohol abuse. Pt found to have PNA, microctic anemia, renal failurs and HTN urgency.    PT Comments    Pt asleep on arrival to room. Upon awakening, pt agreeable to work with therapy, however very lethargic throughout session. Pt ambulated in hall with min A for stability. High level balance activities performed during ambulation. Pt with noticeable balance deficits during gait, staggering when performing gait with head turns. She would benefit from continued skilled PT to increase safety with mobility and functional independence. Will continue to follow acutely.    Follow Up Recommendations  Supervision/Assistance - 24 hour;Home health PT     Equipment Recommendations  None recommended by PT    Recommendations for Other Services       Precautions / Restrictions Precautions Precautions: Fall Precaution Comments: watch O2 Restrictions Weight Bearing Restrictions: No    Mobility  Bed Mobility Overal bed mobility: Modified Independent Bed Mobility: Supine to Sit;Sit to Supine     Supine to sit: HOB elevated;Modified independent (Device/Increase time) Sit to supine: HOB elevated;Modified independent (Device/Increase time)      Transfers Overall transfer level: Needs assistance Equipment used: None Transfers: Sit to/from Stand Sit to Stand: Supervision         General transfer comment: supervision for safety.   Ambulation/Gait Ambulation/Gait assistance: Min assist Gait Distance (Feet): 250 Feet Assistive device: None;1 person hand held assist Gait Pattern/deviations: Decreased step length - right;Decreased step length - left;Decreased stride length;Step-through pattern;Drifts right/left Gait velocity: slow   General Gait  Details: Pt reaching for hand rails initially. Offered pt 1 person HHA for stability. 1x LOB with head turns. Worked on high level balance activities in hall.   Stairs             Wheelchair Mobility    Modified Rankin (Stroke Patients Only)       Balance Overall balance assessment: Needs assistance Sitting-balance support: No upper extremity supported;Feet unsupported Sitting balance-Leahy Scale: Good Sitting balance - Comments: pt able to don bedroom shoes at EOB   Standing balance support: No upper extremity supported;During functional activity Standing balance-Leahy Scale: Fair Standing balance comment: able to static stand without support              High level balance activites: Side stepping;Braiding;Backward walking;Head turns(tantem walking) High Level Balance Comments: Performed high level balance activities in hall with rail. Pt using at least single UE support for all activities and intermittant bil UE support during balance activities.  Most instability noted with head turns in hall causing pt to stagger.            Cognition Arousal/Alertness: Lethargic;Suspect due to medications Behavior During Therapy: [facility] for tasks assessed/performed Overall Cognitive Status: Within Functional Limits for tasks assessed                                        Exercises      General Comments        Pertinent Vitals/Pain Pain Assessment: Faces Faces Pain Scale: No hurt    Home Living                      Prior  Function            PT Goals (current goals can now be found in the care plan section) Acute Rehab PT Goals Patient Stated Goal: get better PT Goal Formulation: With patient Time For Goal Achievement: 03/01/18 Potential to Achieve Goals: Good Progress towards PT goals: Progressing toward goals    Frequency    Min 3X/week      PT Plan Current plan remains appropriate    Co-evaluation               AM-PAC PT "6 Clicks" Daily Activity  Outcome Measure  Difficulty turning over in bed (including adjusting bedclothes, sheets and blankets)?: None Difficulty moving from lying on back to sitting on the side of the bed? : None Difficulty sitting down on and standing up from a chair with arms (e.g., wheelchair, bedside commode, etc,.)?: None Help needed moving to and from a bed to chair (including a wheelchair)?: None Help needed walking in hospital room?: A Little Help needed climbing 3-5 steps with a railing? : A Little 6 Click Score: 22    End of Session Equipment Utilized During Treatment: Gait belt Activity Tolerance: Other (comment);Patient tolerated treatment well Patient left: in bed;with call bell/phone within reach(sitting EOB) Nurse Communication: Mobility status PT Visit Diagnosis: Unsteadiness on feet (R26.81);Difficulty in walking, not elsewhere classified (R26.2)     Time: 2878-6767 PT Time Calculation (min) (ACUTE ONLY): 16 min  Charges:  $Gait Training: 8-22 mins                    G Codes:       Benjiman Core, [place] Pager [phone] Acute Rehab   Allena Katz 03/03/2018, 11:27 AM

## 2018-03-03 NOTE — Progress Notes (Signed)
PROGRESS NOTE    ROSALEA WITHROW  ZOX:[id number] DOB: 1956/03/11 DOA: 02/12/2018 PCP: Patient, No Pcp Per   Brief Narrative: Patient is a 62 year old female with past medical history of cocaine/alcohol abuse was admitted on 02/12/2018 with abdominal pain, nausea, vomiting.  She was found to have Streptococcus pneumonia  and also found to have acute kidney injury.  She was found to have MPO positive rapidly progressive glomerulonephritis and pulmonary evidence  of diffuse alveolar hemorrhage versus bronchiolitis obliterans with organizing pneumonia/cryptogenic organizing pneumonia.  She has progressed to end-stage renal disease and is currently  undergoing dialysis, plasmapheresis and has been  started on cyclophosphamide.  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  Patient has H/O HCV.Nephrology following.  Hemodialysis was started on 6/12.  On 7th plasmapheresis today.  She was treated with IV Solu-Medrol but it has been changed to prednisone 60 mg daily.  Cyclophosphamide started on 02/18/2018.  On Bactrim for PCP prophylaxis.  Status post tunneled catheter placement by IR.  Vein mapping completed by vascular surgery and she will undergo AV graft placement on 03/04/2018.  Pulmonary infiltrates: Found to have Diffuse alveolar hemorrhage versus bronchiolitis obliterans or cryptogenic organizing pneumonia.  Currently her respiratory status is stable.  It is unclear if this pulmonary  finding is associated with her RPGN.  Pulmonary was following.  Sepsis secondary to streptococcal pneumonia: Urine antigen was positive for enterococcus.  Completed the course of antibiotics.  Blood cultures no growth till date.  HCV antibody positive: Patient can follow-up as an outpatient for this.  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  Hyperglycemia: Most likely has treated with high-dose steroids.  Hemoglobin A1c's.  Continue sliding scale insulin, Lantus.  History of cocaine abuse: UDS was positive for cocaine on admission.  Counseling provided.    DVT prophylaxis: Heparin [place] Code Status: Full Family Communication: Family member present at the bedside Disposition Plan: Home after setting of outpatient dialysis and completion of plasmapheresis   Consultants: Nephrology, will interventional radiology, pulmonary, vascular surgery  Procedures: Dialysis, plasmapheresis   Antimicrobials:None currently  Subjective: Patient seen and examined the bedside this morning.  Remains comfortable.  Respiratory status is stable.  No new issues/complaints.  She was eager to know about her discharge plan.  Objective: Vitals:   03/02/18 1729 03/03/18 0058 03/03/18 0333 03/03/18 0716  BP: (!) 144/90 (!) 184/96 (!) 160/76 (!) 162/88  Pulse: 85 64 67 67  Resp: 17 (!) 22  16  Temp: 98.8 F (37.1 C) 99.4 F (37.4 C)  97.6 F (36.4 C)  TempSrc: Oral Oral  Oral  SpO2: 97% 100% 96% 95%  Weight:      Height:        Intake/Output Summary (Last 24 hours) at 03/03/2018 1241 Last data filed at 03/03/2018 0952 Gross per 24 hour  Intake 670 ml  Output 400 ml  Net 270 ml   Filed Weights   03/01/18  1330 03/01/18 1730 03/02/18 0412  Weight: 74.5 kg (164 lb 3.9 oz) 72.3 kg (159 lb 6.3 oz) 73.3 kg (161 lb 9.6 oz)    Examination:  General exam: Appears calm and comfortable ,Not in distress,average built HEENT:PERRL,Oral mucosa  moist, Ear/Nose normal on gross exam Respiratory system: Bilateral equal air entry, normal vesicular breath sounds, no wheezes or crackles  Cardiovascular system: S1 & S2 heard, RRR. No JVD, murmurs, rubs, gallops or clicks. No pedal edema.  Tunneled catheter on the right chest for HD Gastrointestinal system: Abdomen is nondistended, soft and nontender. No organomegaly or masses felt. Normal bowel sounds heard. Central nervous system: Alert and oriented. No focal neurological deficits. Extremities: No edema, no clubbing ,no cyanosis, distal peripheral pulses palpable. Skin: No rashes, lesions or ulcers,no icterus ,no pallor MSK: Normal muscle bulk,tone ,power Psychiatry: Judgement and insight appear normal. Mood & affect appropriate.     Data Reviewed: I have personally reviewed following labs and imaging studies  CBC: Recent Labs  Lab 02/26/18 0432  02/27/18 0305 02/28/18 0253 02/28/18 1540 03/01/18 0317 03/03/18 0427  WBC 15.8*  --  13.2* 9.3  --  16.6* 12.7*  NEUTROABS  --   --  9.7*  --   --  14.9*  --   HGB 8.7*   < > 8.4* 8.3* 9.2* 8.0* 7.5*  HCT 26.5*   < > 25.6* 25.6* 27.0* 24.4* 22.5*  MCV 78.9  --  79.8 79.5  --  79.5 79.8  PLT 111*  --  131* 120*  --  137* 142*   < > = values in this interval not displayed.   Basic Metabolic Panel: Recent Labs  Lab 02/27/18 0305 02/28/18 0253 02/28/18 1540 03/01/18 0317 03/01/18 2110 03/02/18 0355 03/03/18 0427  NA 143 140 138 139  138 135 139 137  K 4.0 4.9 4.4 4.8  4.8 3.6 4.1 4.1  CL 109 106 101 107  107 100* 104 103  CO2 24 28  --  23  23 25 28 26   GLUCOSE 187* 184* 239* 238*  237* 231*  231* 134* 201*  BUN 76* 38* 42* 60*  60* 26* 34* 63*  CREATININE 5.80* 3.57* 4.30* 5.19*  5.22* 3.00* 3.55* 5.22*  CALCIUM 7.6* 7.9*  --  8.2*  8.1* 7.9* 8.1* 8.0*  MG 1.8  --   --   --   --   --   --   PHOS  --  3.2  --  3.4 3.1 4.1 3.6   GFR: Estimated Creatinine Clearance: 10 mL/min (A) (by C-G formula based on SCr of  5.22 mg/dL (H)). Liver Function Tests: Recent Labs  Lab 02/28/18 0253 03/01/18 0317 03/01/18 2110 03/02/18 0355 03/03/18 0427  ALBUMIN 3.4* 3.9 3.5 3.5 3.2*   No results for input(s): LIPASE, AMYLASE in the last 168 hours. No results for input(s): AMMONIA in the last 168 hours. Coagulation Profile: Recent Labs  Lab 02/25/18 1559  INR 1.20   Cardiac Enzymes: No results for input(s): CKTOTAL, CKMB, CKMBINDEX, TROPONINI in the last 168 hours. BNP (last 3 results) No results for input(s): PROBNP in the last 8760 hours. HbA1C: Recent Labs    03/03/18 0427  HGBA1C 6.8*   CBG: Recent Labs  Lab 03/02/18 1220 03/02/18 1730 03/02/18 2046 03/03/18 0714 03/03/18 1124  GLUCAP 130* 279* 262* 128* 209*   Lipid Profile: No results for input(s): CHOL, HDL, LDLCALC, TRIG, CHOLHDL, LDLDIRECT in the last 72 hours. Thyroid Function Tests: No results for input(s):  TSH, T4TOTAL, FREET4, T3FREE, THYROIDAB in the last 72 hours. Anemia Panel: Recent Labs    03/02/18 0355  FERRITIN 320*  TIBC 99*  IRON 82   Sepsis Labs: No results for input(s): PROCALCITON, LATICACIDVEN in the last 168 hours.  No results found for this or any previous visit (from the past 240 hour(s)).       Radiology Studies: No results found.      Scheduled Meds: . Chlorhexidine Gluconate Cloth  6 each Topical Q0600  . cyclophosphamide  100 mg Oral Daily  . famotidine  20 mg Oral Daily  . insulin aspart  0-20 Units Subcutaneous TID WC  . insulin aspart  0-5 Units Subcutaneous QHS  . insulin aspart  3 Units Subcutaneous TID WC  . insulin glargine  15 Units Subcutaneous Daily  . multivitamin with minerals  1 tablet Oral Daily  . predniSONE  60 mg Oral Q breakfast  . sodium chloride flush  3 mL Intravenous Q12H  . sulfamethoxazole-trimethoprim  1 tablet Oral Once per day on Mon Wed Fri  . thiamine  100 mg Oral Daily   Continuous Infusions: .  ceFAZolin (ANCEF) IV       LOS: 19 days    Time  spent: 25 mins.More than 50% of that time was spent in counseling and/or coordination of care.      Shelly Coss, MD [facility] Pager [phone]  If 7PM-7AM, please contact night-coverage [email or web address] Password [facility] 03/03/2018, 12:41 PM

## 2018-03-04 LAB — BASIC METABOLIC PANEL
Anion gap: 10 (ref 5–15)
BUN: 85 mg/dL — AB (ref 8–23)
CALCIUM: 8 mg/dL — AB (ref 8.9–10.3)
CO2: 24 mmol/L (ref 22–32)
Chloride: 104 mmol/L (ref 98–111)
Creatinine, Ser: 6.41 mg/dL — ABNORMAL HIGH (ref 0.44–1.00)
GFR calc Af Amer: 7 mL/min — ABNORMAL LOW (ref >60)
GFR, EST NON AFRICAN AMERICAN: 6 mL/min — AB (ref >60)
Glucose, Bld: 213 mg/dL — ABNORMAL HIGH (ref 70–99)
Potassium: 4.6 mmol/L (ref 3.5–5.1)
SODIUM: 138 mmol/L (ref 135–145)

## 2018-03-04 LAB — CBC WITH DIFFERENTIAL/PLATELET
ABS IMMATURE GRANULOCYTES: 0.1 10*3/uL (ref 0.0–0.1)
BASOS ABS: 0 10*3/uL (ref 0.0–0.1)
BASOS PCT: 0 %
EOS ABS: 0 10*3/uL (ref 0.0–0.7)
EOS PCT: 0 %
HCT: 23.8 % — ABNORMAL LOW (ref 36.0–46.0)
Hemoglobin: 7.8 g/dL — ABNORMAL LOW (ref 12.0–15.0)
Immature Granulocytes: 1 %
Lymphocytes Relative: 4 %
Lymphs Abs: 0.5 10*3/uL — ABNORMAL LOW (ref 0.7–4.0)
MCH: 26.4 pg (ref 26.0–34.0)
MCHC: 32.8 g/dL (ref 30.0–36.0)
MCV: 80.4 fL (ref 78.0–100.0)
MONO ABS: 0.6 10*3/uL (ref 0.1–1.0)
Monocytes Relative: 5 %
NEUTROS ABS: 12.6 10*3/uL — AB (ref 1.7–7.7)
Neutrophils Relative %: 90 %
PLATELETS: 122 10*3/uL — AB (ref 150–400)
RBC: 2.96 MIL/uL — ABNORMAL LOW (ref 3.87–5.11)
RDW: 20.9 % — AB (ref 11.5–15.5)
WBC: 13.9 10*3/uL — ABNORMAL HIGH (ref 4.0–10.5)

## 2018-03-04 LAB — GLUCOSE, CAPILLARY
GLUCOSE-CAPILLARY: 190 mg/dL — AB (ref 70–99)
GLUCOSE-CAPILLARY: 213 mg/dL — AB (ref 70–99)
Glucose-Capillary: 138 mg/dL — ABNORMAL HIGH (ref 70–99)
Glucose-Capillary: 88 mg/dL (ref 70–99)
Glucose-Capillary: 339 mg/dL — ABNORMAL HIGH (ref 70–99)

## 2018-03-04 LAB — POCT I-STAT, CHEM 8
BUN: 69 mg/dL — AB (ref 8–23)
CALCIUM ION: 1.13 mmol/L — AB (ref 1.15–1.40)
CHLORIDE: 99 mmol/L (ref 98–111)
Creatinine, Ser: 6.3 mg/dL — ABNORMAL HIGH (ref 0.44–1.00)
Glucose, Bld: 210 mg/dL — ABNORMAL HIGH (ref 70–99)
HCT: 22 % — ABNORMAL LOW (ref 36.0–46.0)
Hemoglobin: 7.5 g/dL — ABNORMAL LOW (ref 12.0–15.0)
Potassium: 4.6 mmol/L (ref 3.5–5.1)
SODIUM: 136 mmol/L (ref 135–145)
TCO2: 23 mmol/L (ref 22–32)

## 2018-03-04 LAB — SURGICAL PCR SCREEN
MRSA, PCR: NEGATIVE
Staphylococcus aureus: NEGATIVE

## 2018-03-04 MED ORDER — CHLORHEXIDINE GLUCONATE CLOTH 2 % EX PADS
6.0000 | MEDICATED_PAD | Freq: Every day | CUTANEOUS | Status: DC
  Administered 2018-03-04 – 2018-03-05 (×2): 6 via TOPICAL

## 2018-03-04 MED ORDER — DARBEPOETIN ALFA 40 MCG/0.4ML IJ SOSY
40.0000 ug | PREFILLED_SYRINGE | INTRAMUSCULAR | Status: DC
  Filled 2018-03-04: qty 0.4

## 2018-03-04 NOTE — H&P (View-Only) (Signed)
Spoke with pt this afternoon about having her permanent dialysis access surgery tomorrow.  Will plan for left arm graft vs fistula.  Npo after MN.  Pt understands and no questions.   Leontine Locket, [facility] 03/04/2018 3:33 PM

## 2018-03-04 NOTE — Progress Notes (Signed)
Nurse paged chaplain as patient preparing for dialysis and very down in spirit.  Went to see patient in dialysis. Sat with her and offered pastoral support, prayer.  Stayed present with her until she actually fell asleep going through dialysis. She is very concerned about her health over past 3 weeks, never expected to need dialysis.  Prayed for peace of mind and spirit and for her to feel surrounded by support and love from her family and God.Patient was tearful and that was good as she was able to release some of the things she is concerned about.  She shared her family has been very supportive which is great. Conard Novak, Chaplain   03/04/18 1100  Clinical Encounter Type  Visited With Patient  Visit Type Initial;Spiritual support;Other (Comment) (patient in dialysis and then surgery )  Referral From Nurse  Consult/Referral To Chaplain  Spiritual Encounters  Spiritual Needs Prayer;Emotional  Stress Factors  Patient Stress Factors Other (Comment) (high stress level and concerns)

## 2018-03-04 NOTE — Progress Notes (Signed)
RN Lexi called IV team back stating that her charge nurse stated that they are not to touch HD caths. She also states that she has not been checked off to change HD cath dressings. At this time RN unaware if someone else on the unit has been checked off and can do the dressing change. RN states she will call IV team back to make [place] aware if IV team needs to come and change the dressing.

## 2018-03-04 NOTE — Care Management Note (Addendum)
Case Management Note  Patient Details  Name: Rhonda Lynch MRN: [id number] Date of Birth: 15-Nov-1955  Subjective/Objective:   AKI with rapid progressive glomerulonephritis vasculitis, started on HD ( has had 4) and has had 5 therapeutic plasma exchanges, will need 7, a tunneled catheter was placed yesterday.  Plan is to finish receiving tpe and determine if she will need dialysis outpt.  6/25 Tomi Bamberger RN, BSN - she will need HHPT, will check with Butch Penny with [facility] to see if we can do charity HHPT.  Per Butch Penny with [facility] states she should qualify for charity HHPT, but they will not be able to see her until she has had her follow up apt on 7/16, because will need a MD to sign off orders.    6/27 Tomi Bamberger RN, BSN- fistula placed, patient with complications, bleeding under fistula, back to surgery this afternoon.    7/1 Tomi Bamberger RN, BSN - NCM spoke with Caryl Pina in HD , she states CLIP is Pending.                               Action/Plan: DC home when medically stable.  Expected Discharge Date:  02/17/18               Expected Discharge Plan:  [place]  In-House Referral:     Discharge planning Services  CM Consult  Post Acute Care Choice:    Choice offered to:     DME Arranged:    DME Agency:     HH Arranged:    HH Agency:     Status of Service:  In process, will continue to follow  If discussed at Long Length of Stay Meetings, dates discussed:    Additional Comments:  Zenon Mayo, RN 03/04/2018, 3:01 PM

## 2018-03-04 NOTE — Progress Notes (Signed)
[facility] NEPHROLOGY PROGRESS NOTE  Assessment/ Plan: Pt is a 62 y.o. yo female with history of cocaine and alcohol abuse admitted with abdominal pain, nausea vomiting due to septic secondary to Streptococcus pneumonia.  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  Seen by pulmonologist. -Started TPE, Albumin on 6/12, 6/13, 6/15, 6/17, 6/19, 6/21 and 6/24. No further TPE.  -Started hemodialysis on 6/12.  No sign of renal recovery.  Serum creatinine level elevated to 6.41 today.  Plan for dialysis today.  Going for AV fistula creation per vascular surgery.   - Monitor BMP and urine output.   -Patient received Solu-Medrol pulse from 6/11 to 6/13, now on prednisone 60 mill grams daily. -Started cyclophosphamide 100 mg daily since 02/18/2018.  Monitor for leukopenia. -On Bactrim for PCP prophylaxis -Status post tunneled catheter placement on 6/19.  Patient had a vein mapping done, AVF today. -The biopsy showed 70% of the glomeruli affected although mild IFTA, unknown how much renal recovery patient will have.  # Anemia: iron sat 82 %, starting aranesp weekly.  #Polysubstance abuse: Urine positive for cocaine.  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  # HCV Ab positive: Recommended to follow-up outpatient for possible treatment.  #Dispo:  plasma exchange today.  Needs outpatient dialysis arrangement.  Subjective: Seen and examined at bedside.  Doing well.  No nausea vomiting chest pain or shortness of breath.  Objective Vital signs in last 24 hours: Vitals:   03/04/18 0914 03/04/18 0923 03/04/18 0930 03/04/18 1000  BP: (!) 156/91 (!) 169/96 (!) 177/55 (!)  155/81  Pulse: 68 65 65 61  Resp: 15 10 10 14   Temp: 98.3 F (36.8 C)     TempSrc: Oral     SpO2: 97%     Weight: 75.3 kg (166 lb 0.1 oz)     Height:       Weight change:   Intake/Output Summary (Last 24 hours) at 03/04/2018 1112 Last data filed at 03/04/2018 0400 Gross per 24 hour  Intake 519 ml  Output 250 ml  Net 269 ml       Labs: Basic Metabolic Panel: Recent Labs  Lab 03/01/18 2110 03/02/18 0355 03/03/18 0427 03/03/18 1735 03/04/18 0325  NA 135 139 137 136 138  K 3.6 4.1 4.1 4.6 4.6  CL 100* 104 103 99 104  CO2 25 28 26   --  24  GLUCOSE 231*  231* 134* 201* 210* 213*  BUN 26* 34* 63* 69* 85*  CREATININE 3.00* 3.55* 5.22* 6.30* 6.41*  CALCIUM 7.9* 8.1* 8.0*  --  8.0*  PHOS 3.1 4.1 3.6  --   --    Liver Function Tests: Recent Labs  Lab 03/01/18 2110 03/02/18 0355 03/03/18 0427  ALBUMIN 3.5 3.5 3.2*   No results for input(s): LIPASE, AMYLASE in the last 168 hours. No results for input(s): AMMONIA in the last 168 hours. CBC: Recent Labs  Lab 02/27/18 0305 02/28/18 0253  03/01/18 0317 03/03/18 0427 03/03/18 1735 03/04/18 0325  WBC 13.2* 9.3  --  16.6* 12.7*  --  13.9*  NEUTROABS 9.7*  --   --  14.9*  --   --  12.6*  HGB 8.4* 8.3*   < > 8.0* 7.5* 7.5*  7.8*  HCT 25.6* 25.6*   < > 24.4* 22.5* 22.0* 23.8*  MCV 79.8 79.5  --  79.5 79.8  --  80.4  PLT 131* 120*  --  137* 142*  --  122*   < > = values in this interval not displayed.   Cardiac Enzymes: No results for input(s): CKTOTAL, CKMB, CKMBINDEX, TROPONINI in the last 168 hours. CBG: Recent Labs  Lab 03/03/18 0714 03/03/18 1124 03/03/18 1621 03/04/18 0001 03/04/18 0728  GLUCAP 128* 209* 267* 213* 138*    Iron Studies:  Recent Labs    03/02/18 0355  IRON 82  TIBC 99*  FERRITIN 320*   Studies/Results: No results found.  Medications: Infusions: . citrate dextrose 500 mL (03/03/18 1530)    Scheduled Medications: . Chlorhexidine Gluconate Cloth  6 each Topical Q0600  .  Chlorhexidine Gluconate Cloth  6 each Topical Q0600  . cyclophosphamide  100 mg Oral Daily  . darbepoetin (ARANESP) injection - DIALYSIS  40 mcg Intravenous Q Tue-HD  . famotidine  20 mg Oral Daily  . heparin  1,000 Units Intracatheter Once  . insulin aspart  0-20 Units Subcutaneous TID WC  . insulin aspart  0-5 Units Subcutaneous QHS  . insulin aspart  3 Units Subcutaneous TID WC  . insulin glargine  15 Units Subcutaneous Daily  . multivitamin with minerals  1 tablet Oral Daily  . predniSONE  60 mg Oral Q breakfast  . sodium chloride flush  3 mL Intravenous Q12H  . sulfamethoxazole-trimethoprim  1 tablet Oral Once per day on Mon Wed Fri  . thiamine  100 mg Oral Daily    have reviewed scheduled and prn medications.  Physical Exam: General: Not in distress, comfortable Heart: Regular rate rhythm S1-S2 normal Lungs: Clear bilateral, no crackle Abdomen:soft, Non-tender, non-distended Extremities:No edema Dialysis Access: RIJ Tunneled catheter present.  Dron Prasad Bhandari 03/04/2018,11:12 AM  LOS: 20 days

## 2018-03-04 NOTE — Progress Notes (Signed)
Spoke with RN Lexi about HD dressing change. Line is considered a central line and RN was made aware that she can change the dressing.

## 2018-03-04 NOTE — Progress Notes (Signed)
Spoke with pt this afternoon about having her permanent dialysis access surgery tomorrow.  Will plan for left arm graft vs fistula.  Npo after MN.  Pt understands and no questions.   Leontine Locket, [facility] 03/04/2018 3:33 PM

## 2018-03-04 NOTE — Plan of Care (Signed)
Discussed with patient plan of care for the evening , pain management and coping skills with her anxiety with teach back displayed

## 2018-03-04 NOTE — Progress Notes (Signed)
PROGRESS NOTE    Rhonda Lynch  ZCH:[id number] DOB: 1956/06/24 DOA: 02/12/2018 PCP: Patient, No Pcp Per   Brief Narrative: Patient is a 62 year old female with past medical history of cocaine/alcohol abuse who was admitted on 02/12/2018 with abdominal pain, nausea, vomiting.  She was found to have Streptococcus pneumonia  and also had acute kidney injury on presentation. Nephrology was consulted . 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  She has progressed to end-stage renal disease and is currently  undergoing dialysis, plasmapheresis and has been  started on cyclophosphamide.She is getting AV graft placement today.  Discharge planning after  setting of outpatient dialysis facility.  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  Patient has H/O HCV.Nephrology following.  Hemodialysis was started on 6/12. Had her  7th session of plasmapheresis yesterday.  She is on prednisone 60 mg daily.  Cyclophosphamide started on 02/18/2018.  On Bactrim for PCP prophylaxis.  Status post tunneled catheter placement by IR.  Vein mapping completed by vascular surgery and she will undergo AV graft placement on 03/04/2018.  Pulmonary infiltrates: Found to have Diffuse alveolar hemorrhage versus bronchiolitis  obliterans or cryptogenic organizing pneumonia as per the imagings. She did not  undergo bronchoscopy.  Currently her respiratory status is stable.  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  Pulmonary was following.  Sepsis secondary to streptococcal pneumonia: Urine antigen was positive for streptococcus.  Completed the course of antibiotics.  Blood cultures no growth till date.  HCV antibody positive: Patient should follow-up as an outpatient with ID for this.  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  Hyperglycemia: Most likely associated with high-dose steroids.  Hemoglobin A1c is 6.8.  Continue sliding scale insulin, Lantus her during her hospitalization.She might need oral antihyperglycemic agents on discharge  History of cocaine abuse: UDS was positive for cocaine on admission.  Counseling provided.  Leukocytosis: Most likely associated with the steroids.  We will continue to monitor.  Anemia: Most likely secondary to her chronic kidney disease.  Does not need transfusion at the moment.    DVT prophylaxis: Heparin [place] Code Status: Full Family Communication: No Family member present at the bedside Disposition Plan: Home after setting of outpatient dialysis and completion of plasmapheresis , nephrology clearance  Consultants: Nephrology, interventional radiology, pulmonary, vascular surgery  Procedures: Dialysis, plasmapheresis   Antimicrobials:None currently  Subjective: Patient seen and examined the bedside this morning.  Remains comfortable.  Awaiting for AV graft placement today.  No overnight fever, nausea or vomiting.  Respiratory status stable. Objective: Vitals:   03/03/18 1820 03/04/18 0004 03/04/18 0440 03/04/18 0731  BP: 137/72 135/88  (!) 142/67  Pulse:  67  65  Resp: 18 18  (!) 26  Temp: 98.6 F (37 C) 97.8 F (36.6 C)  98.8 F (37.1 C)  TempSrc: Oral Oral  Oral    SpO2: 96% 97%  97%  Weight:   76.9 kg (169 lb 8.5 oz)   Height:        Intake/Output Summary (Last 24 hours) at 03/04/2018 0814 Last data filed at 03/04/2018 0400 Gross per 24 hour  Intake 759 ml  Output 250 ml  Net 509 ml   Filed Weights   03/02/18 0412 03/03/18 1642 03/04/18 0440  Weight: 73.3 kg (161 lb 9.6 oz) 77.7 kg (171 lb 4.8 oz) 76.9 kg (169 lb 8.5 oz)    Examination:  General exam: Appears calm and comfortable ,Not in distress,average built HEENT:PERRL,Oral mucosa moist, Ear/Nose normal on gross exam Respiratory system: Bilateral equal air entry, normal vesicular breath sounds, no wheezes or crackles  Cardiovascular system: S1 & S2 heard, RRR. No JVD, murmurs, rubs, gallops or clicks. Gastrointestinal system: Abdomen is nondistended, soft and nontender. No organomegaly or masses felt. Normal bowel sounds heard. Central nervous system: Alert and oriented. No focal neurological deficits. Extremities: No edema, no clubbing ,no cyanosis, distal peripheral pulses palpable. Skin: No rashes, lesions or ulcers,no icterus ,no pallor.  Dialysis catheter on the right chest MSK: Normal muscle bulk,tone ,power Psychiatry: Judgement and insight appear normal. Mood & affect appropriate.   .     Data Reviewed: I have personally reviewed following labs and imaging studies  CBC: Recent Labs  Lab 02/27/18 0305 02/28/18 0253 02/28/18 1540 03/01/18 0317 03/03/18 0427 03/04/18 0325  WBC 13.2* 9.3  --  16.6* 12.7* 13.9*  NEUTROABS 9.7*  --   --  14.9*  --  12.6*  HGB 8.4* 8.3* 9.2* 8.0* 7.5* 7.8*  HCT 25.6* 25.6* 27.0* 24.4* 22.5* 23.8*  MCV 79.8 79.5  --  79.5 79.8 80.4  PLT 131* 120*  --  137* 142* 619*   Basic Metabolic Panel: Recent Labs  Lab 02/27/18 0305 02/28/18 0253  03/01/18 0317 03/01/18 2110 03/02/18 0355 03/03/18 0427 03/04/18 0325  NA 143 140   < > 139  138 135 139 137 138  K 4.0 4.9   < > 4.8  4.8 3.6 4.1 4.1 4.6  CL 109 106   < > 107  107 100* 104  103 104  CO2 24 28  --  23  23 25 28 26 24   GLUCOSE 187* 184*   < > 238*  237* 231*  231* 134* 201* 213*  BUN 76* 38*   < > 60*  60* 26* 34* 63* 85*  CREATININE 5.80* 3.57*   < > 5.19*  5.22* 3.00* 3.55* 5.22* 6.41*  CALCIUM 7.6* 7.9*  --  8.2*  8.1* 7.9* 8.1* 8.0* 8.0*  MG 1.8  --   --   --   --   --   --   --   PHOS  --  3.2  --  3.4 3.1 4.1 3.6  --    < > = values in this interval not displayed.   GFR: Estimated Creatinine Clearance: 8.3 mL/min (A) (by C-G formula based on SCr of 6.41 mg/dL (H)). Liver Function Tests: Recent Labs  Lab 02/28/18 0253 03/01/18 0317 03/01/18 2110 03/02/18 0355 03/03/18 0427  ALBUMIN 3.4* 3.9 3.5 3.5 3.2*   No results for input(s): LIPASE, AMYLASE in the last 168 hours. No results for input(s): AMMONIA in the last 168 hours. Coagulation Profile: Recent Labs  Lab 02/25/18 1559  INR 1.20   Cardiac Enzymes: No results for input(s): CKTOTAL, CKMB,  CKMBINDEX, TROPONINI in the last 168 hours. BNP (last 3 results) No results for input(s): PROBNP in the last 8760 hours. HbA1C: Recent Labs    03/03/18 0427  HGBA1C 6.8*   CBG: Recent Labs  Lab 03/03/18 0714 03/03/18 1124 03/03/18 1621 03/04/18 0001 03/04/18 0728  GLUCAP 128* 209* 267* 213* 138*   Lipid Profile: No results for input(s): CHOL, HDL, LDLCALC, TRIG, CHOLHDL, LDLDIRECT in the last 72 hours. Thyroid Function Tests: No results for input(s): TSH, T4TOTAL, FREET4, T3FREE, THYROIDAB in the last 72 hours. Anemia Panel: Recent Labs    03/02/18 0355  FERRITIN 320*  TIBC 99*  IRON 82   Sepsis Labs: No results for input(s): PROCALCITON, LATICACIDVEN in the last 168 hours.  No results found for this or any previous visit (from the past 240 hour(s)).       Radiology Studies: No results found.      Scheduled Meds: . Chlorhexidine Gluconate Cloth  6 each Topical Q0600  . Chlorhexidine Gluconate Cloth  6 each Topical Q0600  . cyclophosphamide  100 mg Oral Daily    . famotidine  20 mg Oral Daily  . heparin  1,000 Units Intracatheter Once  . insulin aspart  0-20 Units Subcutaneous TID WC  . insulin aspart  0-5 Units Subcutaneous QHS  . insulin aspart  3 Units Subcutaneous TID WC  . insulin glargine  15 Units Subcutaneous Daily  . multivitamin with minerals  1 tablet Oral Daily  . predniSONE  60 mg Oral Q breakfast  . sodium chloride flush  3 mL Intravenous Q12H  . sulfamethoxazole-trimethoprim  1 tablet Oral Once per day on Mon Wed Fri  . thiamine  100 mg Oral Daily   Continuous Infusions: .  ceFAZolin (ANCEF) IV    . citrate dextrose 500 mL (03/03/18 1530)     LOS: 20 days    Time spent: 25 mins.More than 50% of that time was spent in counseling and/or coordination of care.      Shelly Coss, MD [facility] Pager [phone]  If 7PM-7AM, please contact night-coverage [email or web address] Password [facility] 03/04/2018, 8:14 AM

## 2018-03-05 ENCOUNTER — Encounter (HOSPITAL_COMMUNITY)
Admission: EM | Disposition: A | Discharge status: Home Health | Payer: Self-pay | Source: Home / Self Care | Attending: Internal Medicine

## 2018-03-05 ENCOUNTER — Encounter (HOSPITAL_COMMUNITY): Payer: Self-pay | Admitting: *Deleted

## 2018-03-05 ENCOUNTER — Inpatient Hospital Stay (HOSPITAL_COMMUNITY): Payer: Medicaid Other | Admitting: Certified Registered Nurse Anesthetist

## 2018-03-05 ENCOUNTER — Encounter (HOSPITAL_COMMUNITY): Payer: Self-pay | Admitting: Nephrology

## 2018-03-05 HISTORY — PX: AV FISTULA PLACEMENT: SHX1204

## 2018-03-05 LAB — BASIC METABOLIC PANEL
ANION GAP: 6 (ref 5–15)
BUN: 47 mg/dL — ABNORMAL HIGH (ref 8–23)
CALCIUM: 7.5 mg/dL — AB (ref 8.9–10.3)
CO2: 29 mmol/L (ref 22–32)
Chloride: 102 mmol/L (ref 98–111)
Creatinine, Ser: 4.11 mg/dL — ABNORMAL HIGH (ref 0.44–1.00)
GFR, EST AFRICAN AMERICAN: 12 mL/min — AB (ref >60)
GFR, EST NON AFRICAN AMERICAN: 11 mL/min — AB (ref >60)
Glucose, Bld: 96 mg/dL (ref 70–99)
POTASSIUM: 3.9 mmol/L (ref 3.5–5.1)
SODIUM: 137 mmol/L (ref 135–145)

## 2018-03-05 LAB — CBC WITH DIFFERENTIAL/PLATELET
ABS IMMATURE GRANULOCYTES: 0.1 10*3/uL (ref 0.0–0.1)
Basophils Absolute: 0 10*3/uL (ref 0.0–0.1)
Basophils Relative: 0 %
EOS PCT: 0 %
Eosinophils Absolute: 0 10*3/uL (ref 0.0–0.7)
HCT: 21.8 % — ABNORMAL LOW (ref 36.0–46.0)
Hemoglobin: 7.1 g/dL — ABNORMAL LOW (ref 12.0–15.0)
IMMATURE GRANULOCYTES: 1 %
LYMPHS ABS: 1.1 10*3/uL (ref 0.7–4.0)
LYMPHS PCT: 10 %
MCH: 26.2 pg (ref 26.0–34.0)
MCHC: 32.6 g/dL (ref 30.0–36.0)
MCV: 80.4 fL (ref 78.0–100.0)
MONO ABS: 0.7 10*3/uL (ref 0.1–1.0)
Monocytes Relative: 6 %
Neutro Abs: 8.9 10*3/uL — ABNORMAL HIGH (ref 1.7–7.7)
Neutrophils Relative %: 83 %
PLATELETS: 100 10*3/uL — AB (ref 150–400)
RBC: 2.71 MIL/uL — AB (ref 3.87–5.11)
RDW: 21 % — AB (ref 11.5–15.5)
WBC: 10.8 10*3/uL — ABNORMAL HIGH (ref 4.0–10.5)

## 2018-03-05 LAB — GLUCOSE, CAPILLARY
GLUCOSE-CAPILLARY: 67 mg/dL — AB (ref 70–99)
GLUCOSE-CAPILLARY: 58 mg/dL — AB (ref 70–99)
GLUCOSE-CAPILLARY: 131 mg/dL — AB (ref 70–99)
GLUCOSE-CAPILLARY: 52 mg/dL — AB (ref 70–99)
Glucose-Capillary: 117 mg/dL — ABNORMAL HIGH (ref 70–99)
Glucose-Capillary: 79 mg/dL (ref 70–99)
Glucose-Capillary: 114 mg/dL — ABNORMAL HIGH (ref 70–99)
Glucose-Capillary: 99 mg/dL (ref 70–99)

## 2018-03-05 SURGERY — ARTERIOVENOUS (AV) FISTULA CREATION
Anesthesia: Monitor Anesthesia Care | Site: Arm Upper | Laterality: Left

## 2018-03-05 MED ORDER — PROPOFOL 10 MG/ML IV BOLUS
INTRAVENOUS | Status: DC | PRN
  Administered 2018-03-05: 40 mg via INTRAVENOUS

## 2018-03-05 MED ORDER — DEXTROSE 50 % IV SOLN
INTRAVENOUS | Status: AC
  Filled 2018-03-05: qty 50

## 2018-03-05 MED ORDER — LIDOCAINE-EPINEPHRINE 0.5 %-1:200000 IJ SOLN
INTRAMUSCULAR | Status: AC
  Filled 2018-03-05: qty 1

## 2018-03-05 MED ORDER — LIDOCAINE-EPINEPHRINE 0.5 %-1:200000 IJ SOLN
INTRAMUSCULAR | Status: DC | PRN
  Administered 2018-03-05: 8 mL

## 2018-03-05 MED ORDER — FENTANYL CITRATE (PF) 100 MCG/2ML IJ SOLN
INTRAMUSCULAR | Status: DC | PRN
  Administered 2018-03-05 (×2): 50 ug via INTRAVENOUS

## 2018-03-05 MED ORDER — OXYCODONE HCL 5 MG PO TABS: 5.0000 mg | ORAL_TABLET | Freq: Once | ORAL | Status: DC | PRN

## 2018-03-05 MED ORDER — ONDANSETRON HCL 4 MG/2ML IJ SOLN: 4.0000 mg | Freq: Once | INTRAMUSCULAR | Status: DC | PRN

## 2018-03-05 MED ORDER — MIDAZOLAM HCL 2 MG/2ML IJ SOLN
INTRAMUSCULAR | Status: AC
  Filled 2018-03-05: qty 2

## 2018-03-05 MED ORDER — HEPARIN SODIUM (PORCINE) 5000 UNIT/ML IJ SOLN
INTRAMUSCULAR | Status: DC | PRN
  Administered 2018-03-05: 15:00:00

## 2018-03-05 MED ORDER — OXYCODONE HCL 5 MG/5ML PO SOLN: 5.0000 mg | Freq: Once | ORAL | Status: DC | PRN

## 2018-03-05 MED ORDER — SODIUM CHLORIDE 0.9 % IV SOLN
INTRAVENOUS | Status: DC
  Administered 2018-03-05: 14:00:00 via INTRAVENOUS

## 2018-03-05 MED ORDER — 0.9 % SODIUM CHLORIDE (POUR BTL) OPTIME
TOPICAL | Status: DC | PRN
  Administered 2018-03-05: 1000 mL

## 2018-03-05 MED ORDER — CEFAZOLIN SODIUM-DEXTROSE 2-4 GM/100ML-% IV SOLN
INTRAVENOUS | Status: AC
Start: 2018-03-05 — End: 2018-03-05
  Filled 2018-03-05: qty 100

## 2018-03-05 MED ORDER — ONDANSETRON HCL 4 MG/2ML IJ SOLN
INTRAMUSCULAR | Status: AC
  Filled 2018-03-05: qty 2

## 2018-03-05 MED ORDER — SUGAMMADEX SODIUM 200 MG/2ML IV SOLN
INTRAVENOUS | Status: AC
  Filled 2018-03-05: qty 2

## 2018-03-05 MED ORDER — PHENYLEPHRINE 40 MCG/ML (10ML) SYRINGE FOR IV PUSH (FOR BLOOD PRESSURE SUPPORT)
PREFILLED_SYRINGE | INTRAVENOUS | Status: AC
  Filled 2018-03-05: qty 10

## 2018-03-05 MED ORDER — CHLORHEXIDINE GLUCONATE CLOTH 2 % EX PADS
6.0000 | MEDICATED_PAD | Freq: Every day | CUTANEOUS | Status: DC
  Administered 2018-03-06 – 2018-03-07 (×2): 6 via TOPICAL

## 2018-03-05 MED ORDER — DEXTROSE 50 % IV SOLN
25.0000 mL | Freq: Once | INTRAVENOUS | Status: AC
  Administered 2018-03-05: 25 mL via INTRAVENOUS

## 2018-03-05 MED ORDER — FENTANYL CITRATE (PF) 250 MCG/5ML IJ SOLN
INTRAMUSCULAR | Status: AC
  Filled 2018-03-05: qty 5

## 2018-03-05 MED ORDER — FENTANYL CITRATE (PF) 100 MCG/2ML IJ SOLN: 25.0000 ug | INTRAMUSCULAR | Status: DC | PRN

## 2018-03-05 MED ORDER — DEXTROSE 50 % IV SOLN
1.0000 | Freq: Once | INTRAVENOUS | Status: AC
  Administered 2018-03-05: 50 mL via INTRAVENOUS

## 2018-03-05 MED ORDER — ONDANSETRON HCL 4 MG/2ML IJ SOLN
INTRAMUSCULAR | Status: DC | PRN
  Administered 2018-03-05: 4 mg via INTRAVENOUS

## 2018-03-05 MED ORDER — PROPOFOL 500 MG/50ML IV EMUL
INTRAVENOUS | Status: DC | PRN
  Administered 2018-03-05: 75 ug/kg/min via INTRAVENOUS

## 2018-03-05 MED ORDER — SUCCINYLCHOLINE CHLORIDE 200 MG/10ML IV SOSY
PREFILLED_SYRINGE | INTRAVENOUS | Status: AC
  Filled 2018-03-05: qty 10

## 2018-03-05 MED ORDER — LIDOCAINE 2% (20 MG/ML) 5 ML SYRINGE
INTRAMUSCULAR | Status: AC
  Filled 2018-03-05: qty 20

## 2018-03-05 MED ORDER — CEFAZOLIN SODIUM-DEXTROSE 2-4 GM/100ML-% IV SOLN
2.0000 g | Freq: Once | INTRAVENOUS | Status: AC
  Administered 2018-03-05: 2 g via INTRAVENOUS

## 2018-03-05 MED ORDER — DEXTROSE 50 % IV SOLN
12.5000 g | Freq: Once | INTRAVENOUS | Status: AC
  Administered 2018-03-05: 12.5 g via INTRAVENOUS

## 2018-03-05 MED ORDER — MIDAZOLAM HCL 5 MG/5ML IJ SOLN
INTRAMUSCULAR | Status: DC | PRN
  Administered 2018-03-05: 2 mg via INTRAVENOUS

## 2018-03-05 MED ORDER — DEXAMETHASONE SODIUM PHOSPHATE 10 MG/ML IJ SOLN
INTRAMUSCULAR | Status: AC
  Filled 2018-03-05: qty 1

## 2018-03-05 MED ORDER — HEPARIN SODIUM (PORCINE) 5000 UNIT/ML IJ SOLN
INTRAMUSCULAR | Status: AC
  Filled 2018-03-05: qty 1.2

## 2018-03-05 MED ORDER — PROPOFOL 10 MG/ML IV BOLUS
INTRAVENOUS | Status: AC
  Filled 2018-03-05: qty 20

## 2018-03-05 MED ORDER — ALUM & MAG HYDROXIDE-SIMETH 200-200-20 MG/5ML PO SUSP
30.0000 mL | ORAL | Status: DC | PRN
  Administered 2018-03-05: 30 mL via ORAL
  Filled 2018-03-05: qty 30

## 2018-03-05 SURGICAL SUPPLY — 37 items
ADH SKN CLS APL DERMABOND .7 (GAUZE/BANDAGES/DRESSINGS) ×1
ARMBAND PINK RESTRICT EXTREMIT (MISCELLANEOUS) ×6 IMPLANT
CANISTER SUCT 3000ML PPV (MISCELLANEOUS) ×3 IMPLANT
CANNULA VESSEL 3MM 2 BLNT TIP (CANNULA) ×3 IMPLANT
CLIP LIGATING EXTRA MED SLVR (CLIP) ×3 IMPLANT
CLIP LIGATING EXTRA SM BLUE (MISCELLANEOUS) ×3 IMPLANT
COVER PROBE W GEL 5X96 (DRAPES) ×3 IMPLANT
DECANTER SPIKE VIAL GLASS SM (MISCELLANEOUS) ×3 IMPLANT
DERMABOND ADVANCED (GAUZE/BANDAGES/DRESSINGS) ×2
DERMABOND ADVANCED .7 DNX12 (GAUZE/BANDAGES/DRESSINGS) ×1 IMPLANT
ELECT REM PT RETURN 9FT ADLT (ELECTROSURGICAL) ×3
ELECTRODE REM PT RTRN 9FT ADLT (ELECTROSURGICAL) ×1 IMPLANT
GLOVE BIO SURGEON STRL SZ 6.5 (GLOVE) ×1 IMPLANT
GLOVE BIO SURGEONS STRL SZ 6.5 (GLOVE) ×1
GLOVE BIOGEL M 6.5 STRL (GLOVE) ×2 IMPLANT
GLOVE BIOGEL PI IND STRL 6.5 (GLOVE) IMPLANT
GLOVE BIOGEL PI IND STRL 7.0 (GLOVE) IMPLANT
GLOVE BIOGEL PI IND STRL 7.5 (GLOVE) IMPLANT
GLOVE BIOGEL PI INDICATOR 6.5 (GLOVE) ×2
GLOVE BIOGEL PI INDICATOR 7.0 (GLOVE) ×2
GLOVE BIOGEL PI INDICATOR 7.5 (GLOVE) ×2
GLOVE ECLIPSE 6.5 STRL STRAW (GLOVE) ×2 IMPLANT
GLOVE SS BIOGEL STRL SZ 7.5 (GLOVE) ×1 IMPLANT
GLOVE SUPERSENSE BIOGEL SZ 7.5 (GLOVE) ×2
GOWN STRL REUS W/ TWL LRG LVL3 (GOWN DISPOSABLE) ×3 IMPLANT
GOWN STRL REUS W/TWL LRG LVL3 (GOWN DISPOSABLE) ×9
KIT BASIN OR (CUSTOM PROCEDURE TRAY) ×3 IMPLANT
KIT TURNOVER KIT B (KITS) ×3 IMPLANT
NS IRRIG 1000ML POUR BTL (IV SOLUTION) ×3 IMPLANT
PACK CV ACCESS (CUSTOM PROCEDURE TRAY) ×3 IMPLANT
PAD ARMBOARD 7.5X6 YLW CONV (MISCELLANEOUS) ×6 IMPLANT
SUT PROLENE 6 0 CC (SUTURE) ×3 IMPLANT
SUT VIC AB 3-0 SH 27 (SUTURE) ×3
SUT VIC AB 3-0 SH 27X BRD (SUTURE) ×1 IMPLANT
TOWEL GREEN STERILE (TOWEL DISPOSABLE) ×3 IMPLANT
UNDERPAD 30X30 (UNDERPADS AND DIAPERS) ×3 IMPLANT
WATER STERILE IRR 1000ML POUR (IV SOLUTION) ×3 IMPLANT

## 2018-03-05 NOTE — Anesthesia Postprocedure Evaluation (Signed)
Anesthesia Post Note  Patient: Rhonda Lynch  Procedure(s) Performed: ARTERIOVENOUS (AV) FISTULA CREATION  LEFT UPPER EXTREMITY (Left Arm Upper)     Patient location during evaluation: PACU Anesthesia Type: MAC Level of consciousness: awake and alert Pain management: pain level controlled Vital Signs Assessment: post-procedure vital signs reviewed and stable Respiratory status: spontaneous breathing, nonlabored ventilation, respiratory function stable and patient connected to nasal cannula oxygen Cardiovascular status: stable and blood pressure returned to baseline Postop Assessment: no apparent nausea or vomiting Anesthetic complications: no    Last Vitals:  Vitals:   03/05/18 1717 03/05/18 1718  BP:    Pulse:  67  Resp:  15  Temp: 36.4 C   SpO2:  95%    Last Pain:  Vitals:   03/05/18 1630  TempSrc:   PainSc: 0-No pain                 Parthiv Mucci COKER

## 2018-03-05 NOTE — Progress Notes (Addendum)
[facility] NEPHROLOGY PROGRESS NOTE  Assessment/ Plan: Pt is a 62 y.o. yo female with history of cocaine and alcohol abuse admitted with abdominal pain, nausea vomiting due to septic secondary to Streptococcus pneumonia.  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  Seen by pulmonologist. -received plasma exchange for 7 days.   -Started hemodialysis on 6/12. TDC was placed on 6/19. No sign of renal recovery. Had HD yesterday, tolerated well.  Next dialysis tomorrow.  Going for AV fistula creation per vascular surgery today. Vein mapping done. CLIP in progress.   - Monitor BMP and urine output.   -Patient received Solu-Medrol pulse from 6/11 to 6/13, now on prednisone 60 mill grams daily with plan to slow taper.  -Started cyclophosphamide 100 mg daily since 02/18/2018.  Monitor for leukopenia. -On Bactrim for PCP prophylaxis -The biopsy showed 70% of the glomeruli affected although mild IFTA, unknown how much renal recovery patient will have.  # Anemia: iron sat 82 %, starting aranesp weekly.  #Polysubstance abuse: Urine positive for cocaine.  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  # HCV Ab positive: Recommended to follow-up outpatient for possible treatment.  #Dispo: waiting for outpatient dialysis arrangement.  Subjective: Seen and examined at bedside.  Plan for AV fistula today.  Denied nausea vomiting or chest pain.  Objective Vital signs in last 24 hours: Vitals:   03/04/18 1655 03/05/18 0000 03/05/18 0500 03/05/18 0810  BP: 135/68 (!) 133/55  (!) 146/79  Pulse: 80 88  70  Resp: 16 18  16   Temp: 98.7 F (37.1 C) 98.8 F (37.1 C)  99 F (37.2 C)   TempSrc: Oral Oral  Oral  SpO2: 98% 99%  100%  Weight:   76.9 kg (169 lb 8.5 oz)   Height:       Weight change: -2.4 kg (-5 lb 4.7 oz)  Intake/Output Summary (Last 24 hours) at 03/05/2018 1107 Last data filed at 03/05/2018 0500 Gross per 24 hour  Intake 610 ml  Output 0 ml  Net 610 ml       Labs: Basic Metabolic Panel: Recent Labs  Lab 03/01/18 2110 03/02/18 0355 03/03/18 0427 03/03/18 1735 03/04/18 0325 03/05/18 0328  NA 135 139 137 136 138 137  K 3.6 4.1 4.1 4.6 4.6 3.9  CL 100* 104 103 99 104 102  CO2 25 28 26   --  24 29  GLUCOSE 231*  231* 134* 201* 210* 213* 96  BUN 26* 34* 63* 69* 85* 47*  CREATININE 3.00* 3.55* 5.22* 6.30* 6.41* 4.11*  CALCIUM 7.9* 8.1* 8.0*  --  8.0* 7.5*  PHOS 3.1 4.1 3.6  --   --   --    Liver Function Tests: Recent Labs  Lab 03/01/18 2110 03/02/18 0355 03/03/18 0427  ALBUMIN 3.5 3.5 3.2*   No results for input(s): LIPASE, AMYLASE in the last 168 hours. No results for input(s): AMMONIA in the last 168 hours. CBC: Recent Labs  Lab 02/28/18 0253  03/01/18 0317 03/03/18 0427 03/03/18 1735 03/04/18 0325 03/05/18 0328  WBC 9.3  --  16.6* 12.7*  --  13.9* 10.8*  NEUTROABS  --   --  14.9*  --   --  12.6* 8.9*  HGB 8.3*   < >  8.0* 7.5* 7.5* 7.8* 7.1*  HCT 25.6*   < > 24.4* 22.5* 22.0* 23.8* 21.8*  MCV 79.5  --  79.5 79.8  --  80.4 80.4  PLT 120*  --  137* 142*  --  122* 100*   < > = values in this interval not displayed.   Cardiac Enzymes: No results for input(s): CKTOTAL, CKMB, CKMBINDEX, TROPONINI in the last 168 hours. CBG: Recent Labs  Lab 03/04/18 1411 03/04/18 1606 03/04/18 2054 03/05/18 0807 03/05/18 0903  GLUCAP 88 190* 339* 67* 117*    Iron Studies:  No results for input(s): IRON, TIBC, TRANSFERRIN, FERRITIN in the last 72 hours. Studies/Results: No results found.  Medications: Infusions:   Scheduled Medications: . Chlorhexidine Gluconate Cloth  6 each Topical Q0600  . Chlorhexidine Gluconate Cloth   6 each Topical Q0600  . cyclophosphamide  100 mg Oral Daily  . darbepoetin (ARANESP) injection - DIALYSIS  40 mcg Intravenous Q Tue-HD  . famotidine  20 mg Oral Daily  . insulin aspart  0-20 Units Subcutaneous TID WC  . insulin aspart  0-5 Units Subcutaneous QHS  . insulin aspart  3 Units Subcutaneous TID WC  . insulin glargine  15 Units Subcutaneous Daily  . multivitamin with minerals  1 tablet Oral Daily  . predniSONE  60 mg Oral Q breakfast  . sodium chloride flush  3 mL Intravenous Q12H  . sulfamethoxazole-trimethoprim  1 tablet Oral Once per day on Mon Wed Fri  . thiamine  100 mg Oral Daily    have reviewed scheduled and prn medications.  Physical Exam: General: Not in distress, comfortable Heart: Regular rate rhythm S1-S2 normal Lungs: Clear bilateral, no crackles Abdomen:soft, Non-tender, non-distended Extremities:No edema Dialysis Access: RIJ Tunneled catheter present.  Dron Prasad Bhandari 03/05/2018,11:07 AM  LOS: 21 days

## 2018-03-05 NOTE — Anesthesia Preprocedure Evaluation (Signed)
Anesthesia Evaluation  Patient identified by MRN, date of birth, ID band Patient awake    Reviewed: Allergy & Precautions, NPO status , Patient's Chart, lab work & pertinent test results  Airway Mallampati: II  TM Distance: >3 FB Neck ROM: Full    Dental  (+) Teeth Intact, Dental Advisory Given   Pulmonary Current Smoker,    breath sounds clear to auscultation       Cardiovascular  Rhythm:Regular Rate:Normal     Neuro/Psych    GI/Hepatic   Endo/Other    Renal/GU      Musculoskeletal   Abdominal   Peds  Hematology   Anesthesia Other Findings   Reproductive/Obstetrics                             Anesthesia Physical Anesthesia Plan  ASA: III  Anesthesia Plan: MAC   Post-op Pain Management:    Induction: Intravenous  PONV Risk Score and Plan: Ondansetron  Airway Management Planned: Natural Airway and Simple Face Mask  Additional Equipment:   Intra-op Plan:   Post-operative Plan:   Informed Consent: I have reviewed the patients History and Physical, chart, labs and discussed the procedure including the risks, benefits and alternatives for the proposed anesthesia with the patient or authorized representative who has indicated his/her understanding and acceptance.   Dental advisory given  Plan Discussed with: CRNA and Anesthesiologist  Anesthesia Plan Comments:         Anesthesia Quick Evaluation

## 2018-03-05 NOTE — Op Note (Signed)
    OPERATIVE REPORT  DATE OF SURGERY: 03/05/2018  PATIENT: Rhonda Lynch, 62 y.o. female MRN: [id number]  DOB: 1956/08/06  PRE-OPERATIVE DIAGNOSIS: End-stage renal disease  POST-OPERATIVE DIAGNOSIS:  Same  PROCEDURE: Left brachiocephalic AV fistula  SURGEON:  [name], M.D.  PHYSICIAN ASSISTANT: Nurse  ANESTHESIA: MAC  EBL: Minimal ml  Total I/O In: 0  Out: 10 [Blood:10]  BLOOD ADMINISTERED: None  DRAINS: None  SPECIMEN: None  COUNTS CORRECT:  YES  PLAN OF CARE: PACU  PATIENT DISPOSITION:  PACU - hemodynamically stable  PROCEDURE DETAILS: The patient was taken to the operative placed supine position to the area of the left arm prepped draped in usual sterile fashion.  SonoSite ultrasound was used to visualize the veins.  The patient had a large antecubital vein and the vein was patent throughout the upper arm.  He did run slightly deep under the skin and the fat.  Using local anesthesia incision was made over the antecubital space and could not isolate the cephalic vein which was of large caliber.  The vein was mobilized proximally and distally.  The brachial artery was exposed to the same incision and was of large caliber with an excellent pulse.  The cephalic vein was divided distally in the incision and was mobilized to the level of the brachial artery.  The brachial artery was occluded proximally and distally and was opened with an 11 blade and extended longitudinally with Potts scissors.  The vein was spatulated and cut to the appropriate length and sewn end-to-side to the artery with a running 6-0 Prolene suture.  Clamps were removed and good thrill was noted.  The patient maintained a left radial pulse.  The wounds irrigated with saline.  Hemostasis was obtained letter cautery.  Wounds were closed with 3-0 Vicryl in the subcutaneous and sub-particular tissue.  Sterile dressing was applied and the patient was transferred to the recovery room in stable  condition   [name], M.D., [facility] 03/05/2018 3:49 PM

## 2018-03-05 NOTE — Interval H&P Note (Signed)
History and Physical Interval Note:  03/05/2018 2:00 PM  Rhonda Lynch  has presented today for surgery, with the diagnosis of END STAGE RENAL DISEASE FOR HEMODIALYSIS ACCESS  The various methods of treatment have been discussed with the patient and family. After consideration of risks, benefits and other options for treatment, the patient has consented to  Procedure(s): ARTERIOVENOUS (AV) FISTULA CREATION VERSUS GRAFT LEFT UPPER EXTREMITY (Left) as a surgical intervention .  [name] history has been reviewed, patient examined, no change in status, stable for surgery.  I have reviewed the patient's chart and labs.  Questions were answered to the patient's satisfaction.     [name]

## 2018-03-05 NOTE — Transfer of Care (Signed)
Immediate Anesthesia Transfer of Care Note  Patient: Rhonda Lynch  Procedure(s) Performed: ARTERIOVENOUS (AV) FISTULA CREATION  LEFT UPPER EXTREMITY (Left Arm Upper)  Patient Location: PACU  Anesthesia Type:MAC  Level of Consciousness: awake, alert , oriented and patient cooperative  Airway & Oxygen Therapy: Patient Spontanous Breathing and Patient connected to face mask oxygen  Post-op Assessment: Report given to RN and Post -op Vital signs reviewed and stable  Post vital signs: Reviewed and stable  Last Vitals:  Vitals Value Taken Time  BP    Temp    Pulse 70 03/05/2018  3:54 PM  Resp 19 03/05/2018  3:54 PM  SpO2 97 % 03/05/2018  3:54 PM  Vitals shown include unvalidated device data.  Last Pain:  Vitals:   03/05/18 0810  TempSrc: Oral  PainSc: 0-No pain      Patients Stated Pain Goal: 2 (99/77/41 [id number])  Complications: No apparent anesthesia complications

## 2018-03-05 NOTE — Discharge Instructions (Signed)
° °  [facility] ° °Discharge Instructions ° °AV Fistula or Graft Surgery for Dialysis Access ° °Please refer to the following instructions for your post-procedure care. Your surgeon or physician assistant will discuss any changes with you. ° °Activity ° °You may drive the day following your surgery, if you are comfortable and no longer taking prescription pain medication. Resume full activity as the soreness in your incision resolves. ° °Bathing/Showering ° °You may shower after you go home. Keep your incision dry for 48 hours. Do not soak in a bathtub, hot tub, or swim until the incision heals completely. You may not shower if you have a hemodialysis catheter. ° °Incision Care ° °Clean your incision with mild soap and water after 48 hours. Pat the area dry with a clean towel. You do not need a bandage unless otherwise instructed. Do not apply any ointments or creams to your incision. You may have skin glue on your incision. Do not peel it off. It will come off on its own in about one week. Your arm may swell a bit after surgery. To reduce swelling use pillows to elevate your arm so it is above your heart. Your doctor will tell you if you need to lightly wrap your arm with an ACE bandage. ° °Diet ° °Resume your normal diet. There are not special food restrictions following this procedure. In order to heal from your surgery, it is CRITICAL to get adequate nutrition. Your body requires vitamins, minerals, and protein. Vegetables are the best source of vitamins and minerals. Vegetables also provide the perfect balance of protein. Processed food has little nutritional value, so try to avoid this. ° °Medications ° °Resume taking all of your medications. If your incision is causing pain, you may take over-the counter pain relievers such as acetaminophen (Tylenol). If you were prescribed a stronger pain medication, please be aware these medications can cause nausea and constipation. Prevent  nausea by taking the medication with a snack or meal. Avoid constipation by drinking plenty of fluids and eating foods with high amount of fiber, such as fruits, vegetables, and grains. Do not take Tylenol if you are taking prescription pain medications. ° ° ° ° °Follow up °Your surgeon may want to see you in the office following your access surgery. If so, this will be arranged at the time of your surgery. ° °Please call us immediately for any of the following conditions: ° °Increased pain, redness, drainage (pus) from your incision site °Fever of 101 degrees or higher °Severe or worsening pain at your incision site °Hand pain or numbness. ° °Reduce your risk of vascular disease: ° °Stop smoking. If you would like help, call [facility] at 1-800-QUIT-NOW (1-800-784-8669) or [place] at 336-586-4000 ° °Manage your cholesterol °Maintain a desired weight °Control your diabetes °Keep your blood pressure down ° °Dialysis ° °It will take several weeks to several months for your new dialysis access to be ready for use. Your surgeon will determine when it is OK to use it. Your nephrologist will continue to direct your dialysis. You can continue to use your Permcath until your new access is ready for use. ° °If you have any questions, please call the office at [phone]. ° °

## 2018-03-05 NOTE — Progress Notes (Addendum)
PROGRESS NOTE    Rhonda Lynch  TKW:[id number] DOB: 01/25/1956 DOA: 02/12/2018 PCP: Patient, No Pcp Per   Brief Narrative: Patient is a 62 year old female with past medical history of cocaine/alcohol abuse who was admitted on 02/12/2018 with abdominal pain, nausea, vomiting.  She was found to have Streptococcus pneumonia  and also had acute kidney injury on presentation. Nephrology was consulted . 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  She has progressed to end-stage renal disease and is currently  undergoing dialysis, plasmapheresis and has been  Started, on cyclophosphamide. Discharge planning after  setting of outpatient dialysis facility.  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  Was on Lantus and sliding scale insulin.  Currently n.p.o.  Continue hypoglycemia protocol  Acute kidney injury leading to end-stage renal disease secondary to rapidly proliferative glomerulo nephritis.  Biopsy proven.  Nephrology on board.  Patient is on hemodialysis since 6/12 and has been receiving plasmapheresis as well.  Continue on prednisone and cyclophosphamide.  Cyclophosphamide started on 02/18/2018.  On Bactrim for PCP prophylaxis.  Status post tunnel catheter placement by IR.  AV graft placement planned for today.   Diffuse alveolar hemorrhage versus bronchiolitis  obliterans or cryptogenic organizing pneumonia as per the imaging. She did not  undergo bronchoscopy.  Stable respiratory status at this time.  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  Pulmonary was following.  Sepsis secondary to streptococcal pneumonia: Urine antigen was positive for streptococcus.  Blood cultures have been negative.  Completed the course of antibiotics.  HCV antibody positive: Patient should follow-up as an outpatient with ID for this.  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  Hyperglycemia: Likely secondary to high-dose steroids.  Hemoglobin A1c of 6.8.  Continue on sliding scale insulin and Lantus.  Patient might need oral antidiabetic medications on discharge.  History of cocaine abuse: UDS was positive for cocaine on admission.  Currently stable  Leukocytosis: Most likely associated with the steroids.  Mild and trending down.  We will continue to monitor.  Anemia: Most likely secondary to her chronic kidney disease.  No need for transfusion at the moment.   DVT prophylaxis: Heparin [place]  Code Status: Full  Family Communication: No Family member present at the bedside  Disposition Plan: Home after setting of outpatient dialysis and completion of plasmapheresis , nephrology clearance  Consultants: Nephrology, interventional radiology, pulmonary, vascular surgery  Procedures: Dialysis, plasmapheresis   Antimicrobials: None currently  Subjective: Patient seen and examined the bedside this morning.  Remains comfortable.  Awaiting for AV graft placement today.  No overnight fever, nausea or vomiting.  Respiratory status stable.  Objective: Vitals:   03/04/18 1655 03/05/18 0000 03/05/18 0500 03/05/18 0810  BP: 135/68 (!) 133/55  (!) 146/79  Pulse: 80 88  70  Resp: 16 18  16   Temp: 98.7 F (  37.1 C) 98.8 F (37.1 C)  99 F (37.2 C)  TempSrc: Oral Oral  Oral  SpO2:  98% 99%  100%  Weight:   76.9 kg (169 lb 8.5 oz)   Height:        Intake/Output Summary (Last 24 hours) at 03/05/2018 0905 Last data filed at 03/05/2018 0500 Gross per 24 hour  Intake 610 ml  Output 0 ml  Net 610 ml   Filed Weights   03/04/18 0440 03/04/18 0914 03/05/18 0500  Weight: 76.9 kg (169 lb 8.5 oz) 75.3 kg (166 lb 0.1 oz) 76.9 kg (169 lb 8.5 oz)    Examination:  General exam: Appears calm and comfortable ,Not in distress,average built HEENT:PERRL,Oral mucosa moist, Ear/Nose normal on gross exam Respiratory system: Bilateral equal air entry, normal vesicular breath sounds, no wheezes or crackles.  Right chest wall nternal jugular tunneled hemodialysis catheter  Cardiovascular system: S1 & S2 heard, RRR. No JVD, murmurs, rubs, gallops or clicks. Gastrointestinal system: Abdomen is nondistended, soft and nontender. No organomegaly or masses felt. Normal bowel sounds heard. Central nervous system: Alert and oriented. No focal neurological deficits. Extremities: No edema, no clubbing ,no cyanosis, distal peripheral pulses palpable. Skin: No rashes, lesions or ulcers,no icterus ,no pallor.   MSK: Normal muscle bulk,tone ,power Psychiatry: Judgement and insight appear normal. Mood & affect appropriate.    Data Reviewed: I have personally reviewed following labs and imaging studies  CBC: Recent Labs  Lab 02/27/18 0305 02/28/18 0253  03/01/18 0317 03/03/18 0427 03/03/18 1735 03/04/18 0325 03/05/18 0328  WBC 13.2* 9.3  --  16.6* 12.7*  --  13.9* 10.8*  NEUTROABS 9.7*  --   --  14.9*  --   --  12.6* 8.9*  HGB 8.4* 8.3*   < > 8.0* 7.5* 7.5* 7.8* 7.1*  HCT 25.6* 25.6*   < > 24.4* 22.5* 22.0* 23.8* 21.8*  MCV 79.8 79.5  --  79.5 79.8  --  80.4 80.4  PLT 131* 120*  --  137* 142*  --  122* 100*   < > = values in this interval not displayed.   Basic Metabolic Panel: Recent Labs  Lab 02/27/18 0305 02/28/18 0253  03/01/18 0317 03/01/18 2110 03/02/18 0355 03/03/18 0427  03/03/18 1735 03/04/18 0325 03/05/18 0328  NA 143 140   < > 139  138 135 139 137 136 138 137  K 4.0 4.9   < > 4.8  4.8 3.6 4.1 4.1 4.6 4.6 3.9  CL 109 106   < > 107  107 100* 104 103 99 104 102  CO2 24 28  --  23  23 25 28 26   --  24 29  GLUCOSE 187* 184*   < > 238*  237* 231*  231* 134* 201* 210* 213* 96  BUN 76* 38*   < > 60*  60* 26* 34* 63* 69* 85* 47*  CREATININE 5.80* 3.57*   < > 5.19*  5.22* 3.00* 3.55* 5.22* 6.30* 6.41* 4.11*  CALCIUM 7.6* 7.9*  --  8.2*  8.1* 7.9* 8.1* 8.0*  --  8.0* 7.5*  MG 1.8  --   --   --   --   --   --   --   --   --   PHOS  --  3.2  --  3.4 3.1 4.1 3.6  --   --   --    < > = values in this interval not displayed.   GFR: Estimated Creatinine  Clearance: 13 mL/min (A) (by C-G formula based on SCr of 4.11 mg/dL (H)). Liver Function Tests: Recent Labs  Lab 02/28/18 0253 03/01/18 0317 03/01/18 2110 03/02/18 0355 03/03/18 0427  ALBUMIN 3.4* 3.9 3.5 3.5 3.2*   No results for input(s): LIPASE, AMYLASE in the last 168 hours. No results for input(s): AMMONIA in the last 168 hours. Coagulation Profile: No results for input(s): INR, PROTIME in the last 168 hours. Cardiac Enzymes: No results for input(s): CKTOTAL, CKMB, CKMBINDEX, TROPONINI in the last 168 hours. BNP (last 3 results) No results for input(s): PROBNP in the last 8760 hours. HbA1C: Recent Labs    03/03/18 0427  HGBA1C 6.8*   CBG: Recent Labs  Lab 03/04/18 1411 03/04/18 1606 03/04/18 2054 03/05/18 0807 03/05/18 0903  GLUCAP 88 190* 339* 67* 117*   Lipid Profile: No results for input(s): CHOL, HDL, LDLCALC, TRIG, CHOLHDL, LDLDIRECT in the last 72 hours. Thyroid Function Tests: No results for input(s): TSH, T4TOTAL, FREET4, T3FREE, THYROIDAB in the last 72 hours. Anemia Panel: No results for input(s): VITAMINB12, FOLATE, FERRITIN, TIBC, IRON, RETICCTPCT in the last 72 hours. Sepsis Labs: No results for input(s): PROCALCITON, LATICACIDVEN in the last 168 hours.  Recent  Results (from the past 240 hour(s))  Surgical pcr screen     Status: None   Collection Time: 03/04/18  4:52 AM  Result Value Ref Range Status   MRSA, PCR NEGATIVE NEGATIVE Final   Staphylococcus aureus NEGATIVE NEGATIVE Final    Comment: (NOTE) The Xpert SA Assay (FDA approved for NASAL specimens in patients 21 years of age and older), is one component of a comprehensive surveillance program. It is not intended to diagnose infection nor to guide or monitor treatment. Performed at [facility], [place] [address] [place], [place] [address]        Radiology Studies: No results found.   Scheduled Meds: . Chlorhexidine Gluconate Cloth  6 each Topical Q0600  . Chlorhexidine Gluconate Cloth  6 each Topical Q0600  . cyclophosphamide  100 mg Oral Daily  . darbepoetin (ARANESP) injection - DIALYSIS  40 mcg Intravenous Q Tue-HD  . famotidine  20 mg Oral Daily  . insulin aspart  0-20 Units Subcutaneous TID WC  . insulin aspart  0-5 Units Subcutaneous QHS  . insulin aspart  3 Units Subcutaneous TID WC  . insulin glargine  15 Units Subcutaneous Daily  . multivitamin with minerals  1 tablet Oral Daily  . predniSONE  60 mg Oral Q breakfast  . sodium chloride flush  3 mL Intravenous Q12H  . sulfamethoxazole-trimethoprim  1 tablet Oral Once per day on Mon Wed Fri  . thiamine  100 mg Oral Daily   Continuous Infusions:    LOS: 21 days    Time spent: 25 mins.More than 50% of that time was spent in counseling and/or coordination of care.    Flora Lipps, MD [facility] Pager [phone]  If 7PM-7AM, please contact night-coverage [email or web address] Password [email or web address] 03/05/2018, 9:05 AM

## 2018-03-05 NOTE — Progress Notes (Signed)
Physical Therapy Treatment Patient Details Name: Rhonda Lynch MRN: [id number] DOB: 1956-03-29 Today's Date: 03/05/2018    History of Present Illness Pt is a 61 yo female admitted 02/12/18 with epigastric pain and tachypnea; found to have PNA, microctic anemia, renal failure, hypertensive urgency. RIJ tunneled HD cath placed. Planned AV graft placement 6/26. PMH: cocaine and alcohol abuse.   PT Comments    Pt progressing with mobility. Continues to require HHA and close min guard while ambulating due to instability; pt not interested in using any DME for UE support. SpO2 >90% on RA.   Follow Up Recommendations  Home health PT;Supervision/Assistance - 24 hour     Equipment Recommendations  None recommended by PT    Recommendations for Other Services       Precautions / Restrictions Precautions Precautions: Fall Restrictions Weight Bearing Restrictions: No    Mobility  Bed Mobility Overal bed mobility: Independent                Transfers Overall transfer level: Needs assistance Equipment used: None Transfers: Sit to/from Stand Sit to Stand: Supervision         General transfer comment: Standing from bed and toilet  Ambulation/Gait Ambulation/Gait assistance: Min guard Gait Distance (Feet): 300 Feet Assistive device: None;1 person hand held assist Gait Pattern/deviations: Step-through pattern;Decreased stride length;Drifts right/left Gait velocity: Decreased   General Gait Details: Unsteady ambulation with occasional staggering R/L, pt reaching to hand rails for support. Reliant on HHA to maintain balance; declining DME (specifically SPC)   Stairs             Wheelchair Mobility    Modified Rankin (Stroke Patients Only)       Balance Overall balance assessment: Needs assistance Sitting-balance support: No upper extremity supported;Feet unsupported Sitting balance-Leahy Scale: Good     Standing balance support: No upper extremity  supported;During functional activity Standing balance-Leahy Scale: Fair Standing balance comment: Static standing at sink performing ADLs with no UE support               High Level Balance Comments: Able to reach outside of BOS and pick object off floor with no LOB; intermittent UE support for other dynamic tasks            Cognition Arousal/Alertness: Awake/alert Behavior During Therapy: Flat affect Overall Cognitive Status: Within Functional Limits for tasks assessed                                        Exercises      General Comments General comments (skin integrity, edema, etc.): SpO2 >90% on RA       Pertinent Vitals/Pain Pain Assessment: Faces Faces Pain Scale: Hurts a little bit Pain Location: Headache Pain Descriptors / Indicators: Dull;Headache    Home Living                      Prior Function            PT Goals (current goals can now be found in the care plan section) Acute Rehab PT Goals Patient Stated Goal: get better PT Goal Formulation: With patient Time For Goal Achievement: 03/12/18 Potential to Achieve Goals: Good Progress towards PT goals: Progressing toward goals    Frequency    Min 3X/week      PT Plan Current plan remains appropriate    Co-evaluation  AM-PAC PT "6 Clicks" Daily Activity  Outcome Measure  Difficulty turning over in bed (including adjusting bedclothes, sheets and blankets)?: None Difficulty moving from lying on back to sitting on the side of the bed? : None Difficulty sitting down on and standing up from a chair with arms (e.g., wheelchair, bedside commode, etc,.)?: None Help needed moving to and from a bed to chair (including a wheelchair)?: A Little Help needed walking in hospital room?: A Little Help needed climbing 3-5 steps with a railing? : A Little 6 Click Score: 21    End of Session Equipment Utilized During Treatment: Gait belt Activity Tolerance:  Patient tolerated treatment well Patient left: in bed;with bed alarm set;with call bell/phone within reach Nurse Communication: Mobility status       Time: [id number] PT Time Calculation (min) (ACUTE ONLY): 17 min  Charges:  $Gait Training: 8-22 mins                    G Codes:      Mabeline Caras, PT, DPT Acute Rehab Services  Pager: [place] 03/05/2018, 9:58 AM

## 2018-03-05 NOTE — Progress Notes (Signed)
Hypoglycemic Event  CBG: 67  Treatment: D50 IV 25 mL  Symptoms: None  Follow-up CBG: Time:0903 CBG Result:117  Possible Reasons for Event: Inadequate meal intake  Comments/MD notified: MD at bedside, no new orders.    Leeanne Mannan

## 2018-03-06 ENCOUNTER — Inpatient Hospital Stay (HOSPITAL_COMMUNITY): Payer: Medicaid Other

## 2018-03-06 ENCOUNTER — Telehealth: Payer: Self-pay | Admitting: Vascular Surgery

## 2018-03-06 ENCOUNTER — Encounter (HOSPITAL_COMMUNITY)
Admission: EM | Disposition: A | Discharge status: Home Health | Payer: Self-pay | Source: Home / Self Care | Attending: Internal Medicine

## 2018-03-06 ENCOUNTER — Inpatient Hospital Stay (HOSPITAL_COMMUNITY): Payer: Medicaid Other | Admitting: Certified Registered Nurse Anesthetist

## 2018-03-06 ENCOUNTER — Encounter (HOSPITAL_COMMUNITY): Payer: Self-pay | Admitting: Vascular Surgery

## 2018-03-06 DIAGNOSIS — R0600 Dyspnea, unspecified: Secondary | ICD-10-CM

## 2018-03-06 HISTORY — PX: HEMATOMA EVACUATION: SHX5118

## 2018-03-06 LAB — COMPREHENSIVE METABOLIC PANEL
ALBUMIN: 3.2 g/dL — AB (ref 3.5–5.0)
ALK PHOS: 37 U/L — AB (ref 38–126)
ALT: 18 U/L (ref 0–44)
ANION GAP: 9 (ref 5–15)
AST: 33 U/L (ref 15–41)
BUN: 61 mg/dL — ABNORMAL HIGH (ref 8–23)
CALCIUM: 7.5 mg/dL — AB (ref 8.9–10.3)
CO2: 27 mmol/L (ref 22–32)
Chloride: 104 mmol/L (ref 98–111)
Creatinine, Ser: 5.21 mg/dL — ABNORMAL HIGH (ref 0.44–1.00)
GFR calc Af Amer: 9 mL/min — ABNORMAL LOW (ref >60)
GFR calc non Af Amer: 8 mL/min — ABNORMAL LOW (ref >60)
Glucose, Bld: 106 mg/dL — ABNORMAL HIGH (ref 70–99)
POTASSIUM: 4.7 mmol/L (ref 3.5–5.1)
SODIUM: 140 mmol/L (ref 135–145)
TOTAL PROTEIN: 4.1 g/dL — AB (ref 6.5–8.1)
Total Bilirubin: 0.4 mg/dL (ref 0.3–1.2)

## 2018-03-06 LAB — CBC
HCT: 22.9 % — ABNORMAL LOW (ref 36.0–46.0)
HEMOGLOBIN: 7.5 g/dL — AB (ref 12.0–15.0)
MCH: 26.4 pg (ref 26.0–34.0)
MCHC: 32.8 g/dL (ref 30.0–36.0)
MCV: 80.6 fL (ref 78.0–100.0)
PLATELETS: 94 10*3/uL — AB (ref 150–400)
RBC: 2.84 MIL/uL — ABNORMAL LOW (ref 3.87–5.11)
RDW: 21.3 % — ABNORMAL HIGH (ref 11.5–15.5)
WBC: 6.1 10*3/uL (ref 4.0–10.5)

## 2018-03-06 LAB — GLUCOSE, CAPILLARY
GLUCOSE-CAPILLARY: 67 mg/dL — AB (ref 70–99)
GLUCOSE-CAPILLARY: 256 mg/dL — AB (ref 70–99)
GLUCOSE-CAPILLARY: 264 mg/dL — AB (ref 70–99)
Glucose-Capillary: 160 mg/dL — ABNORMAL HIGH (ref 70–99)
Glucose-Capillary: 169 mg/dL — ABNORMAL HIGH (ref 70–99)
Glucose-Capillary: 320 mg/dL — ABNORMAL HIGH (ref 70–99)

## 2018-03-06 SURGERY — EVACUATION HEMATOMA
Anesthesia: General | Laterality: Left

## 2018-03-06 MED ORDER — FENTANYL CITRATE (PF) 100 MCG/2ML IJ SOLN
INTRAMUSCULAR | Status: DC | PRN
  Administered 2018-03-06 (×3): 50 ug via INTRAVENOUS

## 2018-03-06 MED ORDER — MIDAZOLAM HCL 2 MG/2ML IJ SOLN
INTRAMUSCULAR | Status: AC
  Filled 2018-03-06: qty 2

## 2018-03-06 MED ORDER — SODIUM CHLORIDE 0.9 % IR SOLN
Status: DC | PRN
  Administered 2018-03-06: 1000 mL

## 2018-03-06 MED ORDER — PROMETHAZINE HCL 25 MG/ML IJ SOLN
6.2500 mg | INTRAMUSCULAR | Status: DC | PRN
  Administered 2018-03-06: 6.25 mg via INTRAVENOUS

## 2018-03-06 MED ORDER — PROPOFOL 10 MG/ML IV BOLUS
INTRAVENOUS | Status: DC | PRN
  Administered 2018-03-06: 130 mg via INTRAVENOUS

## 2018-03-06 MED ORDER — DEXAMETHASONE SODIUM PHOSPHATE 10 MG/ML IJ SOLN
INTRAMUSCULAR | Status: AC
  Filled 2018-03-06: qty 1

## 2018-03-06 MED ORDER — CEFAZOLIN SODIUM-DEXTROSE 2-4 GM/100ML-% IV SOLN
INTRAVENOUS | Status: AC
  Filled 2018-03-06: qty 100

## 2018-03-06 MED ORDER — SODIUM CHLORIDE 0.9 % IV SOLN
INTRAVENOUS | Status: AC
  Filled 2018-03-06: qty 1.2

## 2018-03-06 MED ORDER — POLYETHYLENE GLYCOL 3350 17 G PO PACK
17.0000 g | PACK | Freq: Every day | ORAL | Status: DC
  Administered 2018-03-10: 17 g via ORAL
  Filled 2018-03-06 (×3): qty 1

## 2018-03-06 MED ORDER — ONDANSETRON HCL 4 MG/2ML IJ SOLN
INTRAMUSCULAR | Status: AC
  Filled 2018-03-06: qty 2

## 2018-03-06 MED ORDER — LACTATED RINGERS IV SOLN: INTRAVENOUS | Status: DC

## 2018-03-06 MED ORDER — FENTANYL CITRATE (PF) 100 MCG/2ML IJ SOLN: 25.0000 ug | INTRAMUSCULAR | Status: DC | PRN

## 2018-03-06 MED ORDER — ONDANSETRON HCL 4 MG/2ML IJ SOLN
INTRAMUSCULAR | Status: DC | PRN
  Administered 2018-03-06: 4 mg via INTRAVENOUS

## 2018-03-06 MED ORDER — SUGAMMADEX SODIUM 200 MG/2ML IV SOLN
INTRAVENOUS | Status: AC
  Filled 2018-03-06: qty 2

## 2018-03-06 MED ORDER — MEPERIDINE HCL 50 MG/ML IJ SOLN: 6.2500 mg | INTRAMUSCULAR | Status: DC | PRN

## 2018-03-06 MED ORDER — LIDOCAINE HCL (CARDIAC) PF 100 MG/5ML IV SOSY
PREFILLED_SYRINGE | INTRAVENOUS | Status: DC | PRN
  Administered 2018-03-06: 50 mg via INTRAVENOUS

## 2018-03-06 MED ORDER — FENTANYL CITRATE (PF) 250 MCG/5ML IJ SOLN
INTRAMUSCULAR | Status: AC
  Filled 2018-03-06: qty 5

## 2018-03-06 MED ORDER — PROPOFOL 10 MG/ML IV BOLUS
INTRAVENOUS | Status: AC
  Filled 2018-03-06: qty 20

## 2018-03-06 MED ORDER — INSULIN GLARGINE 100 UNIT/ML ~~LOC~~ SOLN
5.0000 [IU] | Freq: Every day | SUBCUTANEOUS | Status: DC
  Administered 2018-03-06 – 2018-03-12 (×7): 5 [IU] via SUBCUTANEOUS
  Filled 2018-03-06 (×7): qty 0.05

## 2018-03-06 MED ORDER — SUCCINYLCHOLINE CHLORIDE 200 MG/10ML IV SOSY
PREFILLED_SYRINGE | INTRAVENOUS | Status: AC
  Filled 2018-03-06: qty 10

## 2018-03-06 MED ORDER — LIDOCAINE HCL (PF) 1 % IJ SOLN
INTRAMUSCULAR | Status: AC
  Filled 2018-03-06: qty 30

## 2018-03-06 MED ORDER — INSULIN ASPART 100 UNIT/ML ~~LOC~~ SOLN
0.0000 [IU] | Freq: Three times a day (TID) | SUBCUTANEOUS | Status: DC
  Administered 2018-03-07: 2 [IU] via SUBCUTANEOUS
  Administered 2018-03-07: 3 [IU] via SUBCUTANEOUS
  Administered 2018-03-08 (×2): 2 [IU] via SUBCUTANEOUS

## 2018-03-06 MED ORDER — PHENYLEPHRINE 40 MCG/ML (10ML) SYRINGE FOR IV PUSH (FOR BLOOD PRESSURE SUPPORT)
PREFILLED_SYRINGE | INTRAVENOUS | Status: AC
  Filled 2018-03-06: qty 10

## 2018-03-06 MED ORDER — SODIUM CHLORIDE 0.9 % IV SOLN
INTRAVENOUS | Status: DC
  Administered 2018-03-06: 14:00:00 via INTRAVENOUS

## 2018-03-06 MED ORDER — PROMETHAZINE HCL 25 MG/ML IJ SOLN
INTRAMUSCULAR | Status: AC
  Filled 2018-03-06: qty 1

## 2018-03-06 MED ORDER — CEFAZOLIN SODIUM-DEXTROSE 2-3 GM-%(50ML) IV SOLR
INTRAVENOUS | Status: DC | PRN
  Administered 2018-03-06: 2 g via INTRAVENOUS

## 2018-03-06 MED ORDER — INSULIN GLARGINE 100 UNIT/ML ~~LOC~~ SOLN: 10.0000 [IU] | Freq: Every day | SUBCUTANEOUS | Status: DC

## 2018-03-06 SURGICAL SUPPLY — 37 items
ADH SKN CLS APL DERMABOND .7 (GAUZE/BANDAGES/DRESSINGS) ×1
ARMBAND PINK RESTRICT EXTREMIT (MISCELLANEOUS) ×3 IMPLANT
BANDAGE ACE 4X5 VEL STRL LF (GAUZE/BANDAGES/DRESSINGS) ×2 IMPLANT
BNDG GAUZE ELAST 4 BULKY (GAUZE/BANDAGES/DRESSINGS) ×2 IMPLANT
CANISTER SUCT 3000ML PPV (MISCELLANEOUS) ×3 IMPLANT
CANNULA VESSEL 3MM 2 BLNT TIP (CANNULA) ×3 IMPLANT
CLIP VESOCCLUDE MED 6/CT (CLIP) ×3 IMPLANT
CLIP VESOCCLUDE SM WIDE 6/CT (CLIP) ×3 IMPLANT
COVER PROBE W GEL 5X96 (DRAPES) IMPLANT
DERMABOND ADVANCED (GAUZE/BANDAGES/DRESSINGS) ×2
DERMABOND ADVANCED .7 DNX12 (GAUZE/BANDAGES/DRESSINGS) ×1 IMPLANT
ELECT REM PT RETURN 9FT ADLT (ELECTROSURGICAL) ×3
ELECTRODE REM PT RTRN 9FT ADLT (ELECTROSURGICAL) ×1 IMPLANT
GAUZE SPONGE 4X4 12PLY STRL (GAUZE/BANDAGES/DRESSINGS) ×2 IMPLANT
GLOVE BIO SURGEON STRL SZ7.5 (GLOVE) ×3 IMPLANT
GLOVE BIOGEL PI IND STRL 7.0 (GLOVE) IMPLANT
GLOVE BIOGEL PI IND STRL 7.5 (GLOVE) IMPLANT
GLOVE BIOGEL PI IND STRL 8 (GLOVE) ×1 IMPLANT
GLOVE BIOGEL PI INDICATOR 7.0 (GLOVE) ×2
GLOVE BIOGEL PI INDICATOR 7.5 (GLOVE) ×2
GLOVE BIOGEL PI INDICATOR 8 (GLOVE) ×2
GOWN STRL REUS W/ TWL LRG LVL3 (GOWN DISPOSABLE) ×3 IMPLANT
GOWN STRL REUS W/TWL LRG LVL3 (GOWN DISPOSABLE) ×9
KIT BASIN OR (CUSTOM PROCEDURE TRAY) ×3 IMPLANT
KIT TURNOVER KIT B (KITS) ×3 IMPLANT
NS IRRIG 1000ML POUR BTL (IV SOLUTION) ×3 IMPLANT
PACK CV ACCESS (CUSTOM PROCEDURE TRAY) ×3 IMPLANT
PAD ARMBOARD 7.5X6 YLW CONV (MISCELLANEOUS) ×6 IMPLANT
SPONGE SURGIFOAM ABS GEL 100 (HEMOSTASIS) IMPLANT
SUT PROLENE 6 0 BV (SUTURE) ×3 IMPLANT
SUT VIC AB 3-0 SH 27 (SUTURE) ×3
SUT VIC AB 3-0 SH 27X BRD (SUTURE) ×1 IMPLANT
SUT VIC AB 4-0 PS2 18 (SUTURE) ×2 IMPLANT
SUT VICRYL 4-0 PS2 18IN ABS (SUTURE) ×3 IMPLANT
TOWEL GREEN STERILE (TOWEL DISPOSABLE) ×3 IMPLANT
UNDERPAD 30X30 (UNDERPADS AND DIAPERS) ×3 IMPLANT
WATER STERILE IRR 1000ML POUR (IV SOLUTION) ×3 IMPLANT

## 2018-03-06 NOTE — Interval H&P Note (Signed)
History and Physical Interval Note:  03/06/2018 2:14 PM  Rhonda Lynch  has presented today for surgery, with the diagnosis of COMPLICATION WITH ARTERIOVENOUS FISTULA LEFT ARM  The various methods of treatment have been discussed with the patient and family. After consideration of risks, benefits and other options for treatment, the patient has consented to  Procedure(s): EXPLORATION POSSIBLE EVACUATION LEFT ARTERIOVENOUS FISTULA (Left) as a surgical intervention .  [name] history has been reviewed, patient examined, no change in status, stable for surgery.  I have reviewed the patient's chart and labs.  Questions were answered to the patient's satisfaction.     Deitra Mayo

## 2018-03-06 NOTE — Telephone Encounter (Signed)
sch appt vm full 04/22/18  2pm Dialysis Duplex 3pm p/o PA

## 2018-03-06 NOTE — Progress Notes (Signed)
PROGRESS NOTE    Rhonda Lynch  NAT:[id number] DOB: 03-21-1956 DOA: 02/12/2018 PCP: Patient, No Pcp Per   Brief Narrative: Patient is a 62 year old female with past medical history of cocaine/alcohol abuse who was admitted on 02/12/2018 with abdominal pain, nausea, vomiting.  She was found to have Streptococcus pneumonia  and also had acute kidney injury on presentation. Nephrology was consulted . 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  She has progressed to end-stage renal disease and is currently  undergoing dialysis, plasmapheresis and has been  started, on steroids and cyclophosphamide. Discharge planning after  setting of outpatient dialysis facility.  Received AV graft on 03/05/2018.  Assessment & Plan:   Principal Problem:   PNEUMONIA Active Problems:   ELEVATED BLOOD PRESSURE WITHOUT DIAGNOSIS OF HYPERTENSION   Renal failure   Acute respiratory failure with hypoxia (HCC)   Alcohol abuse   Microcytic anemia   Cocaine abuse ([facility])   Epigastric pain   Pneumonia   Pulmonary infiltrate   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[facility])   Diffuse pulmonary alveolar hemorrhage   Hypoxemia   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CC)  Hypoglycemia.  We will decrease the dose of Lantus tonight.  Continue on sliding scale insulin.  Continue hypoglycemia protocol  Left arm pain status post AV graft placement.  Vascular surgery to follow.  Low-grade fever noted today.  We will continue to monitor.  Low-grade fever this a.m.  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  Nephrology on board.  Patient is on hemodialysis since 6/12 and received plasmapheresis as well.  on prednisone and cyclophosphamide.   Cyclophosphamide started on 02/18/2018.  On Bactrim for PCP prophylaxis.  Status post tunnel catheter placement by IR.  AV graft placement on 03/06/2018 by vascular surgery.   Diffuse alveolar hemorrhage versus bronchiolitis obliterans or cryptogenic organizing pneumonia as per the imaging.  Pulmonary followed the patient during hospitalization.  Currently stable.  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    Sepsis secondary to streptococcal pneumonia: Urine antigen was positive for streptococcus.  Blood cultures have been negative.  Completed the course of antibiotics.  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  Patient will need to follow-up with infectious disease as outpatient.    History of cocaine abuse: UDS was positive for cocaine on admission.  Currently stable.  No agitation or anxiety noted  Leukocytosis: Most likely associated with the steroids.  Mild and trending down.  We will continue to monitor.  Anemia: Most likely secondary to her chronic kidney disease.  No need for transfusion at the moment.   DVT prophylaxis: SCD  Code Status: Full  Family Communication: No Family member present at the bedside  Disposition Plan: Home after setting of outpatient dialysis and completion of plasmapheresis , nephrology clearance  Consultants: Nephrology, interventional radiology, pulmonary, vascular surgery  Procedures: Dialysis, plasmapheresis   Antimicrobials: None currently  Subjective:  Patient seen and examined at bedside.  She complains of left upper extremity pain and swelling after her AV graft placement yesterday.  Low-grade fever denies cough chest pain nausea vomiting  Objective: Vitals:   03/05/18 1747 03/06/18 0045 03/06/18 0500 03/06/18 0752  BP: 128/76 128/70  (!) 160/86  Pulse: 68 75  83  Resp:  Marland Kitchen)  21    Temp: 98.5 F (36.9 C) 99.3 F (37.4 C)  (!) 100.4 F (38 C)  TempSrc: Oral Oral  Oral  SpO2: 99%  100%  100%  Weight:   76.1 kg (167 lb 12.3 oz)   Height:        Intake/Output Summary (Last 24 hours) at 03/06/2018 0846 Last data filed at 03/05/2018 2200 Gross per 24 hour  Intake 1028 ml  Output 10 ml  Net 1018 ml   Filed Weights   03/05/18 0500 03/05/18 1337 03/06/18 0500  Weight: 76.9 kg (169 lb 8.5 oz) 76.9 kg (169 lb 8.5 oz) 76.1 kg (167 lb 12.3 oz)    Examination:  General exam: Appears calm and comfortable ,Not in distress,average built HEENT:PERRL,Oral mucosa moist, Ear/Nose normal on gross exam Respiratory system: Bilateral equal air entry, normal vesicular breath sounds, no wheezes or crackles.  Right chest wall internal jugular tunneled hemodialysis catheter  Cardiovascular system: S1 & S2 heard, RRR. No JVD, murmurs, rubs, gallops or clicks. Gastrointestinal system: Abdomen is nondistended, soft and nontender. No organomegaly or masses felt. Normal bowel sounds heard. Central nervous system: Alert and oriented. No focal neurological deficits. Extremities: No edema, no clubbing ,no cyanosis, distal peripheral pulses palpable.  Left upper extremity AV graft area with edema erythema tenderness. Skin: No rashes, lesions or ulcers,no icterus ,no pallor.  Left upper extremity edematous MSK: Normal muscle bulk,tone ,power Psychiatry: Judgement and insight appear normal. Mood & affect appropriate.    Data Reviewed: I have personally reviewed following labs and imaging studies  CBC: Recent Labs  Lab 03/01/18 0317 03/03/18 0427 03/03/18 1735 03/04/18 0325 03/05/18 0328 03/06/18 0312  WBC 16.6* 12.7*  --  13.9* 10.8* 6.1  NEUTROABS 14.9*  --   --  12.6* 8.9*  --   HGB 8.0* 7.5* 7.5* 7.8* 7.1* 7.5*  HCT 24.4* 22.5* 22.0* 23.8* 21.8* 22.9*  MCV 79.5 79.8  --  80.4 80.4 80.6  PLT 137* 142*  --  122* 100* 94*   Basic Metabolic Panel: Recent Labs  Lab 02/28/18 0253  03/01/18 0317 03/01/18 2110 03/02/18 0355 03/03/18 0427 03/03/18 1735 03/04/18 0325 03/05/18 0328  03/06/18 0312  NA 140   < > 139  138 135 139 137 136 138 137 140  K 4.9   < > 4.8  4.8 3.6 4.1 4.1 4.6 4.6 3.9 4.7  CL 106   < > 107  107 100* 104 103 99 104 102 104  CO2 28  --  23  23 25 28 26   --  24 29 27   GLUCOSE 184*   < > 238*  237* 231*  231* 134* 201* 210* 213* 96 106*  BUN 38*   < > 60*  60* 26* 34* 63* 69* 85* 47* 61*  CREATININE 3.57*   < > 5.19*  5.22* 3.00* 3.55* 5.22* 6.30* 6.41* 4.11* 5.21*  CALCIUM 7.9*  --  8.2*  8.1* 7.9* 8.1* 8.0*  --  8.0* 7.5* 7.5*  PHOS 3.2  --  3.4 3.1 4.1 3.6  --   --   --   --    < > = values in this interval not displayed.   GFR: Estimated Creatinine Clearance: 10.2 mL/min (A) (by C-G formula based on SCr of 5.21 mg/dL (H)). Liver Function Tests: Recent Labs  Lab 03/01/18 0317 03/01/18 2110 03/02/18 0355 03/03/18 0427 03/06/18 0312  AST  --   --   --   --  33  ALT  --   --   --   --  18  ALKPHOS  --   --   --   --  37*  BILITOT  --   --   --   --  0.4  PROT  --   --   --   --  4.1*  ALBUMIN 3.9 3.5 3.5 3.2* 3.2*   No results for input(s): LIPASE, AMYLASE in the last 168 hours. No results for input(s): AMMONIA in the last 168 hours. Coagulation Profile: No results for input(s): INR, PROTIME in the last 168 hours. Cardiac Enzymes: No results for input(s): CKTOTAL, CKMB, CKMBINDEX, TROPONINI in the last 168 hours. BNP (last 3 results) No results for input(s): PROBNP in the last 8760 hours. HbA1C: No results for input(s): HGBA1C in the last 72 hours. CBG: Recent Labs  Lab 03/05/18 1717 03/05/18 1743 03/05/18 2138 03/06/18 0748 03/06/18 0818  GLUCAP 131* 114* 99 67* 160*   Lipid Profile: No results for input(s): CHOL, HDL, LDLCALC, TRIG, CHOLHDL, LDLDIRECT in the last 72 hours. Thyroid Function Tests: No results for input(s): TSH, T4TOTAL, FREET4, T3FREE, THYROIDAB in the last 72 hours. Anemia Panel: No results for input(s): VITAMINB12, FOLATE, FERRITIN, TIBC, IRON, RETICCTPCT in the last 72 hours. Sepsis  Labs: No results for input(s): PROCALCITON, LATICACIDVEN in the last 168 hours.  Recent Results (from the past 240 hour(s))  Surgical pcr screen     Status: None   Collection Time: 03/04/18  4:52 AM  Result Value Ref Range Status   MRSA, PCR NEGATIVE NEGATIVE Final   Staphylococcus aureus NEGATIVE NEGATIVE Final    Comment: (NOTE) The Xpert SA Assay (FDA approved for NASAL specimens in patients 56 years of age and older), is one component of a comprehensive surveillance program. It is not intended to diagnose infection nor to guide or monitor treatment. Performed at [facility], [place] [address] [place],  [address]        Radiology Studies: No results found.   Scheduled Meds: . Chlorhexidine Gluconate Cloth  6 each Topical Q0600  . Chlorhexidine Gluconate Cloth  6 each Topical Q0600  . Chlorhexidine Gluconate Cloth  6 each Topical Q0600  . cyclophosphamide  100 mg Oral Daily  . darbepoetin (ARANESP) injection - DIALYSIS  40 mcg Intravenous Q Tue-HD  . famotidine  20 mg Oral Daily  . insulin aspart  0-20 Units Subcutaneous TID WC  . insulin aspart  0-5 Units Subcutaneous QHS  . insulin aspart  3 Units Subcutaneous TID WC  . insulin glargine  15 Units Subcutaneous Daily  . multivitamin with minerals  1 tablet Oral Daily  . polyethylene glycol  17 g Oral Daily  . predniSONE  60 mg Oral Q breakfast  . sodium chloride flush  3 mL Intravenous Q12H  . sulfamethoxazole-trimethoprim  1 tablet Oral Once per day on Mon Wed Fri  . thiamine  100 mg Oral Daily   Continuous Infusions: . sodium chloride 10 mL/hr at 03/05/18 1341     LOS: 22 days    Time spent: 25 mins.More than 50% of that time was spent in counseling and/or coordination of care.   Rhonda Lipps, MD [facility] Pager [phone]  If 7PM-7AM, please contact night-coverage [email or web address] Password [facility] 03/06/2018, 8:46 AM

## 2018-03-06 NOTE — Progress Notes (Signed)
[name], female   DOB: 1956/03/30, 62 y.o.   MRN: [id number] Patient has more pain and swelling in the antecubital space than typical.  She does have 2+ left radial pulse and does have a bruit in her fistula.  She is inconsolable and pain.  Nurses report that this is been present through the night.  Recommended reexploration in the operating room.  In all likelihood has hematoma in this area but difficult to tell from physical exam alone.  Discussed with Dr. Scot Dock who is available for reexploration.  The patient had juice at approximately 8 AM

## 2018-03-06 NOTE — Transfer of Care (Signed)
Immediate Anesthesia Transfer of Care Note  Patient: Rhonda Lynch  Procedure(s) Performed: EVACUATION HEMATOMA LEFT ARM (Left )  Patient Location: PACU  Anesthesia Type:General  Level of Consciousness: awake, alert , oriented and patient cooperative  Airway & Oxygen Therapy: Patient Spontanous Breathing and Patient connected to nasal cannula oxygen  Post-op Assessment: Report given to RN and Post -op Vital signs reviewed and stable  Post vital signs: Reviewed and stable  Last Vitals:  Vitals Value Taken Time  BP 139/66 03/06/2018  5:55 PM  Temp 36.9 C 03/06/2018  5:55 PM  Pulse 70 03/06/2018  5:55 PM  Resp 18 03/06/2018  5:55 PM  SpO2 96 % 03/06/2018  5:55 PM    Last Pain:  Vitals:   03/06/18 1755  TempSrc: Oral  PainSc:       Patients Stated Pain Goal: 2 (00/29/84 7308)  Complications: No apparent anesthesia complications

## 2018-03-06 NOTE — Anesthesia Postprocedure Evaluation (Signed)
Anesthesia Post Note  Patient: LUDIA GARTLAND  Procedure(s) Performed: EVACUATION HEMATOMA LEFT ARM (Left )     Patient location during evaluation: PACU Anesthesia Type: General Level of consciousness: awake and alert Pain management: pain level controlled Vital Signs Assessment: post-procedure vital signs reviewed and stable Respiratory status: spontaneous breathing, nonlabored ventilation, respiratory function stable and patient connected to nasal cannula oxygen Cardiovascular status: blood pressure returned to baseline and stable Postop Assessment: no apparent nausea or vomiting Anesthetic complications: no    Last Vitals:  Vitals:   03/06/18 1620 03/06/18 1638  BP: 139/73 (!) 154/74  Pulse: 60 64  Resp: 15 15  Temp: (!) 36.3 C 36.7 C  SpO2: 94% 99%    Last Pain:  Vitals:   03/06/18 1638  TempSrc: Oral  PainSc:                  Effie Berkshire

## 2018-03-06 NOTE — Op Note (Signed)
    NAME: Rhonda Lynch    MRN: [id number] DOB: 02/28/1956    DATE OF OPERATION: 03/06/2018  PREOP DIAGNOSIS:    Pain left arm status post left brachiocephalic AV fistula  POSTOP DIAGNOSIS:    Same  PROCEDURE:    Exploration left arm and evacuation of small hematoma  SURGEON: Judeth Cornfield. Scot Dock, MD, FACS  ASSIST: None  ANESTHESIA: General  EBL: Minimal  INDICATIONS:    Rhonda Lynch is a 62 y.o. female who underwent a left brachiocephalic fistula yesterday.  She was having significant arm pain at the antecubital level.  She had a thrill in her fistula and a palpable radial pulse.  The pain was somewhat out of proportion to exam and it was felt that exploration was indicated.  FINDINGS:   There was a small hematoma which was evacuated.  There was really no significant bleeding noted.  Several small areas were cauterized.  TECHNIQUE:   The patient was taken to the operating room and received a general anesthetic.  The left arm was prepped and draped in the usual sterile fashion.  The previous incision was opened.  Upon exploration there was a small hematoma which was evacuated.  There was several areas that were oozing and were cauterized.  The fistula was intact with a good thrill.  The anastomosis had no bleeding.  Where the vein had been ligated distally was intact.  The wound was irrigated and hemostasis obtained.  The wound was then closed with a deep layer 3-0 Vicryl to skin closed with 4-0 Vicryl.  Dermabond was applied.  Sterile dressing was applied.  Patient tolerated the procedure well and was transferred to the recovery room in stable condition.  All needle and sponge counts were correct.  Deitra Mayo, MD, FACS [facility]  DATE OF DICTATION:   03/06/2018

## 2018-03-06 NOTE — H&P (View-Only) (Signed)
[name], female   DOB: 1956/07/14, 62 y.o.   MRN: [id number] Patient has more pain and swelling in the antecubital space than typical.  She does have 2+ left radial pulse and does have a bruit in her fistula.  She is inconsolable and pain.  Nurses report that this is been present through the night.  Recommended reexploration in the operating room.  In all likelihood has hematoma in this area but difficult to tell from physical exam alone.  Discussed with Dr. Scot Dock who is available for reexploration.  The patient had juice at approximately 8 AM

## 2018-03-06 NOTE — Progress Notes (Signed)
Hypoglycemic Event  CBG: 67  Treatment: 15 GM carbohydrate snack  Symptoms: Shaky  Follow-up CBG: Time:160 CBG Result: 0818  Possible Reasons for Event: Inadequate meal intake  Comments/MD notified: MD in hallway, notified in person, no new orders at this time    Leeanne Mannan

## 2018-03-06 NOTE — Progress Notes (Signed)
Early, MD and Carolin Sicks, MD at bedside. Plan is for HD in the am and OR in the afternoon. Patient to be kept NPO from now until post sx. Patient is aware.

## 2018-03-06 NOTE — Progress Notes (Signed)
Patient's Left arm AV fistula has become increasingly swollen, warm and tender to the touch with complaints of tingling/ burning  in palms and fingers.  Brabham,MD- made aware  Will continue to monitor

## 2018-03-06 NOTE — Progress Notes (Signed)
Rhonda Lynch  Assessment/ Plan: Pt is a 62 y.o. yo female with history of cocaine and alcohol abuse admitted with abdominal pain, nausea vomiting due to septic secondary to Streptococcus pneumonia.  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  Seen by pulmonologist. -received plasma exchange for 7 days.   -Started hemodialysis on 6/12. TDC was placed on 6/19.  AV fistula was created on 6/26.  No sign of renal recovery yet. Plan for dialysis today. CLIP in progress.   - Monitor BMP and urine output.   -Patient received Solu-Medrol pulse from 6/11 to 6/13, now on prednisone 60 mill grams daily with plan to slow taper.  -Started cyclophosphamide 100 mg daily since 02/18/2018.  Monitor for leukopenia.  WBC count is trending down. -On Bactrim for PCP prophylaxis -The biopsy showed 70% of the glomeruli affected although mild IFTA, unknown how much renal recovery patient will have.  #Left upper extremity pain and swelling status post AV fistula creation: Concerning for hematoma.  Dr. Donnetta Hutching is planning to take her to  OR this afternoon.  Continue supportive care.  # Anemia: iron sat 82 %, starting aranesp weekly.  #Polysubstance abuse: Urine positive for cocaine.  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  # HCV Ab positive: Recommended to follow-up outpatient for possible treatment.  #Dispo: waiting for outpatient dialysis arrangement.  Subjective: Overnight event noted.  Patient with severe pain and swelling at the site of AV fistula.  Seen by Dr. early this morning.  Denies chest pain or shortness of breath.  Objective Vital signs in last 24  hours: Vitals:   03/06/18 0854 03/06/18 0903 03/06/18 0908 03/06/18 0930  BP: (!) 156/85 (!) 154/80  139/76  Pulse: 75 71 73 66  Resp: 13 14 14 12   Temp: 99.5 F (37.5 C)     TempSrc: Oral     SpO2: 100%     Weight: 74 kg (163 lb 2.3 oz)     Height:       Weight change: 1.6 kg (3 lb 8.4 oz)  Intake/Output Summary (Last 24 hours) at 03/06/2018 0935 Last data filed at 03/05/2018 2200 Gross per 24 hour  Intake 1028 ml  Output 10 ml  Net 1018 ml       Labs: Basic Metabolic Panel: Recent Labs  Lab 03/01/18 2110 03/02/18 0355 03/03/18 0427  03/04/18 0325 03/05/18 0328 03/06/18 0312  NA 135 139 137   < > 138 137 140  K 3.6 4.1 4.1   < > 4.6 3.9 4.7  CL 100* 104 103   < > 104 102 104  CO2 25 28 26   --  24 29 27   GLUCOSE 231*  231* 134* 201*   < > 213* 96 106*  BUN 26* 34* 63*   < > 85* 47* 61*  CREATININE 3.00* 3.55* 5.22*   < > 6.41* 4.11* 5.21*  CALCIUM 7.9* 8.1* 8.0*  --  8.0* 7.5* 7.5*  PHOS 3.1 4.1 3.6  --   --   --   --    < > = values in this interval not displayed.   Liver Function Tests: Recent Labs  Lab 03/02/18 0355 03/03/18 0427 03/06/18 0312  AST  --   --  33  ALT  --   --  18  ALKPHOS  --   --  37*  BILITOT  --   --  0.4  PROT  --   --  4.1*  ALBUMIN 3.5 3.2* 3.2*   No results for input(s): LIPASE, AMYLASE in the last 168 hours. No results for input(s): AMMONIA in the last 168 hours. CBC: Recent Labs  Lab 03/01/18 0317 03/03/18 0427  03/04/18 0325 03/05/18 0328 03/06/18 0312  WBC 16.6* 12.7*  --  13.9* 10.8* 6.1  NEUTROABS 14.9*  --   --  12.6* 8.9*  --   HGB 8.0* 7.5*   < > 7.8* 7.1* 7.5*  HCT 24.4* 22.5*   < > 23.8* 21.8* 22.9*  MCV 79.5 79.8  --  80.4 80.4 80.6  PLT 137* 142*  --  122* 100* 94*   < > = values in this interval not displayed.   Cardiac Enzymes: No results for input(s): CKTOTAL, CKMB, CKMBINDEX, TROPONINI in the last 168 hours. CBG: Recent Labs  Lab 03/05/18 1717 03/05/18 1743 03/05/18 2138 03/06/18 0748  03/06/18 0818  GLUCAP 131* 114* 99 67* 160*    Iron Studies:  No results for input(s): IRON, TIBC, TRANSFERRIN, FERRITIN in the last 72 hours. Studies/Results: No results found.  Medications: Infusions: . sodium chloride 10 mL/hr at 03/05/18 1341    Scheduled Medications: . Chlorhexidine Gluconate Cloth  6 each Topical Q0600  . Chlorhexidine Gluconate Cloth  6 each Topical Q0600  . Chlorhexidine Gluconate Cloth  6 each Topical Q0600  . cyclophosphamide  100 mg Oral Daily  . darbepoetin (ARANESP) injection - DIALYSIS  40 mcg Intravenous Q Tue-HD  . famotidine  20 mg Oral Daily  . insulin aspart  0-20 Units Subcutaneous TID WC  . insulin aspart  0-5 Units Subcutaneous QHS  . insulin aspart  3 Units Subcutaneous TID WC  . insulin glargine  10 Units Subcutaneous QHS  . multivitamin with minerals  1 tablet Oral Daily  . polyethylene glycol  17 g Oral Daily  . predniSONE  60 mg Oral Q breakfast  . sodium chloride flush  3 mL Intravenous Q12H  . sulfamethoxazole-trimethoprim  1 tablet Oral Once per day on Mon Wed Fri  . thiamine  100 mg Oral Daily    have reviewed scheduled and prn medications.  Physical Exam: General: Mild distress due to left upper extremity pain Heart: Regular rate rhythm S1-S2 normal l Lungs: Clear bilateral, no crackles Abdomen: Soft, nontender, nondistended Extremities: No edema Dialysis Access: Right IJ TDC catheter.  Upper extremity with swelling and redness.  Radial pulse palpable.    Rhonda Lynch Rhonda Lynch 03/06/2018,9:35 AM  LOS: 22 days

## 2018-03-06 NOTE — Anesthesia Procedure Notes (Signed)
Procedure Name: LMA Insertion Date/Time: 03/06/2018 2:48 PM Performed by: Shirlyn Goltz, CRNA Pre-anesthesia Checklist: Patient identified, Emergency Drugs available, Suction available and Patient being monitored Patient Re-evaluated:Patient Re-evaluated prior to induction Oxygen Delivery Method: Circle system utilized Preoxygenation: Pre-oxygenation with 100% oxygen Induction Type: IV induction LMA: LMA with gastric port inserted LMA Size: 4.0 Number of attempts: 1 Placement Confirmation: positive ETCO2 and breath sounds checked- equal and bilateral Tube secured with: Tape Dental Injury: Teeth and Oropharynx as per pre-operative assessment

## 2018-03-06 NOTE — Anesthesia Preprocedure Evaluation (Addendum)
Anesthesia Evaluation  Patient identified by MRN, date of birth, ID band Patient awake    Reviewed: Allergy & Precautions, NPO status , Patient's Chart, lab work & pertinent test results  Airway Mallampati: II  TM Distance: >3 FB Neck ROM: Full    Dental  (+) Dental Advisory Given, Poor Dentition, Chipped   Pulmonary Current Smoker,    breath sounds clear to auscultation       Cardiovascular negative cardio ROS   Rhythm:Regular Rate:Normal     Neuro/Psych PSYCHIATRIC DISORDERS    GI/Hepatic negative GI ROS, Neg liver ROS,   Endo/Other    Renal/GU ESRFRenal disease     Musculoskeletal   Abdominal   Peds  Hematology  (+) anemia ,   Anesthesia Other Findings   Reproductive/Obstetrics                             Lab Results  Component Value Date   WBC 6.1 03/06/2018   HGB 7.5 (L) 03/06/2018   HCT 22.9 (L) 03/06/2018   MCV 80.6 03/06/2018   PLT 94 (L) 03/06/2018   Lab Results  Component Value Date   CREATININE 5.21 (H) 03/06/2018   BUN 61 (H) 03/06/2018   NA 140 03/06/2018   K 4.7 03/06/2018   CL 104 03/06/2018   CO2 27 03/06/2018   Lab Results  Component Value Date   INR 1.20 02/25/2018   INR 1.07 02/12/2018   EKG: normal sinus rhythm.  Anesthesia Physical  Anesthesia Plan  ASA: III  Anesthesia Plan: General   Post-op Pain Management:    Induction: Intravenous  PONV Risk Score and Plan: 1 and Ondansetron  Airway Management Planned: LMA  Additional Equipment: None  Intra-op Plan:   Post-operative Plan: Extubation in OR  Informed Consent: I have reviewed the patients History and Physical, chart, labs and discussed the procedure including the risks, benefits and alternatives for the proposed anesthesia with the patient or authorized representative who has indicated his/her understanding and acceptance.   Dental advisory given  Plan Discussed with: CRNA and  Anesthesiologist  Anesthesia Plan Comments:        Anesthesia Quick Evaluation

## 2018-03-06 NOTE — Progress Notes (Signed)
Inpatient Diabetes Program Recommendations  AACE/ADA: New Consensus Statement on Inpatient Glycemic Control (2015)  Target Ranges:  Prepandial:   less than 140 mg/dL      Peak postprandial:   less than 180 mg/dL (1-2 hours)      Critically ill patients:  140 - 180 mg/dL   Lab Results  Component Value Date   GLUCAP 160 (H) 03/06/2018   HGBA1C 6.8 (H) 03/03/2018    Review of Glycemic ControlResults for Rhonda, Lynch (MRN [id number]) as of 03/06/2018 09:48  Ref. Range 03/05/2018 17:17 03/05/2018 17:43 03/05/2018 21:38 03/06/2018 07:48 03/06/2018 08:18  Glucose-Capillary Latest Ref Range: 70 - 99 mg/dL 131 (H) 114 (H) 99 67 (L) 160 (H)    Diabetes history: No previous history of DM Outpatient Diabetes medications: None Current orders for Inpatient glycemic control: Lantus 10 units daily, Novolog resistant tid with meals and HS, Novolog 3 units tid with meals Prednisone 60 mg daily Inpatient Diabetes Program Recommendations:    Blood sugars erratic but seem to have decreased now.  No previous history of DM noted, although A1C 6.8%.  She does not seem to need Lantus any more?  May consider also reducing Novolog correction to sensitive tid with meals. Sent text page to MD regarding Lantus.  Patient was not on insulin prior to admit.    Thanks,  Adah Perl, RN, BC-ADM Inpatient Diabetes Coordinator Pager [phone] (8a-5p)

## 2018-03-07 ENCOUNTER — Encounter (HOSPITAL_COMMUNITY): Payer: Self-pay | Admitting: Vascular Surgery

## 2018-03-07 ENCOUNTER — Other Ambulatory Visit: Payer: Self-pay

## 2018-03-07 DIAGNOSIS — Z992 Dependence on renal dialysis: Principal | ICD-10-CM

## 2018-03-07 DIAGNOSIS — R5081 Fever presenting with conditions classified elsewhere: Secondary | ICD-10-CM

## 2018-03-07 DIAGNOSIS — N186 End stage renal disease: Secondary | ICD-10-CM

## 2018-03-07 DIAGNOSIS — Z48812 Encounter for surgical aftercare following surgery on the circulatory system: Secondary | ICD-10-CM

## 2018-03-07 LAB — BASIC METABOLIC PANEL
Anion gap: 9 (ref 5–15)
BUN: 36 mg/dL — ABNORMAL HIGH (ref 8–23)
CO2: 29 mmol/L (ref 22–32)
Calcium: 7.3 mg/dL — ABNORMAL LOW (ref 8.9–10.3)
Chloride: 99 mmol/L (ref 98–111)
Creatinine, Ser: 3.52 mg/dL — ABNORMAL HIGH (ref 0.44–1.00)
GFR, EST AFRICAN AMERICAN: 15 mL/min — AB (ref >60)
GFR, EST NON AFRICAN AMERICAN: 13 mL/min — AB (ref >60)
Glucose, Bld: 214 mg/dL — ABNORMAL HIGH (ref 70–99)
POTASSIUM: 4.3 mmol/L (ref 3.5–5.1)
SODIUM: 137 mmol/L (ref 135–145)

## 2018-03-07 LAB — CBC
HEMATOCRIT: 21.7 % — AB (ref 36.0–46.0)
HEMOGLOBIN: 7 g/dL — AB (ref 12.0–15.0)
MCH: 25.9 pg — ABNORMAL LOW (ref 26.0–34.0)
MCHC: 32.3 g/dL (ref 30.0–36.0)
MCV: 80.4 fL (ref 78.0–100.0)
Platelets: 71 10*3/uL — ABNORMAL LOW (ref 150–400)
RBC: 2.7 MIL/uL — AB (ref 3.87–5.11)
RDW: 20.7 % — ABNORMAL HIGH (ref 11.5–15.5)
WBC: 6.5 10*3/uL (ref 4.0–10.5)

## 2018-03-07 LAB — GLUCOSE, CAPILLARY
GLUCOSE-CAPILLARY: 107 mg/dL — AB (ref 70–99)
Glucose-Capillary: 196 mg/dL — ABNORMAL HIGH (ref 70–99)
Glucose-Capillary: 227 mg/dL — ABNORMAL HIGH (ref 70–99)
Glucose-Capillary: 293 mg/dL — ABNORMAL HIGH (ref 70–99)
Glucose-Capillary: 219 mg/dL — ABNORMAL HIGH (ref 70–99)

## 2018-03-07 MED ORDER — CHLORHEXIDINE GLUCONATE CLOTH 2 % EX PADS
6.0000 | MEDICATED_PAD | Freq: Every day | CUTANEOUS | Status: DC
  Administered 2018-03-07 – 2018-03-14 (×8): 6 via TOPICAL

## 2018-03-07 NOTE — Progress Notes (Signed)
Physical Therapy Treatment Patient Details Name: Rhonda Lynch MRN: [id number] DOB: 22-Feb-1956 Today's Date: 03/07/2018    History of Present Illness Pt is a 62 yo female admitted 02/12/18 with epigastric pain and tachypnea; found to have PNA, microctic anemia, renal failure, hypertensive urgency. RIJ tunneled HD cath placed. S/p L brachiocephalic AV graft placement 6/26; s/p evacuation of small LUE hematoma 6/27. PMH: cocaine and alcohol abuse.   PT Comments    Pt progressing with mobility. Plans to discharge to son's apartment with 2 flights of stairs to enter (bilateral rails); initiated stair training, pt able to ascend/descend 10 steps with single rail support and min guard for balance; easily fatigued requiring 1x standing rest break. Performing ambulation and ADLs in room with supervision. Requires min guard and intermittent HHA for higher level balance tasks, but continues to refuse DME use for added stability. Will follow acutely.   Follow Up Recommendations  Home health PT;Supervision/Assistance - 24 hour     Equipment Recommendations  None recommended by PT    Recommendations for Other Services       Precautions / Restrictions Precautions Precautions: Fall Restrictions Weight Bearing Restrictions: No    Mobility  Bed Mobility Overal bed mobility: Independent                Transfers Overall transfer level: Needs assistance Equipment used: None Transfers: Sit to/from Stand Sit to Stand: Supervision         General transfer comment: Stood from bed, toilet, and repeated time from chair with supervision; intermittent reaching to furniture/rail for UE support  Ambulation/Gait Ambulation/Gait assistance: Min guard Gait Distance (Feet): 400 Feet Assistive device: None;1 person hand held assist Gait Pattern/deviations: Step-through pattern;Decreased stride length;Drifts right/left Gait velocity: Decreased Gait velocity interpretation: 1.31 - 2.62 ft/sec,  indicative of limited community ambulator General Gait Details: Slow, intermittently unsteady amb with min guard; stability improving with single HHA, but pt continues to decline DME   Stairs Stairs: Yes Stairs assistance: Min guard Stair Management: One rail Right;Forwards;Step to pattern Number of Stairs: 10 General stair comments: Ascend/descended 10 steps with single UE support on rail and min guard; pt required 1x standing rest break at top of stairs due to fatigue   Wheelchair Mobility    Modified Rankin (Stroke Patients Only)       Balance Overall balance assessment: Needs assistance Sitting-balance support: No upper extremity supported;Feet unsupported Sitting balance-Leahy Scale: Good Sitting balance - Comments: Performing seated ADL task indep   Standing balance support: No upper extremity supported;During functional activity Standing balance-Leahy Scale: Good Standing balance comment: performed ADLs standing at sink with no UE support             High level balance activites: Direction changes;Turns;Sudden stops;Backward walking High Level Balance Comments: Initial instability with turning but pt able to self-correct with min guard; stability improved after practicing turns 2x more trials. Stepped over objects with HHA and min guard            Cognition Arousal/Alertness: Awake/alert Behavior During Therapy: WFL for tasks assessed/performed Overall Cognitive Status: Within Functional Limits for tasks assessed                                        Exercises      General Comments        Pertinent Vitals/Pain Pain Assessment: 0-10 Pain Score: 7  Pain Location: LUE  Pain Descriptors / Indicators: Sore Pain Intervention(s): Monitored during session;Patient requesting pain meds-RN notified    Home Living                      Prior Function            PT Goals (current goals can now be found in the care plan section)  Acute Rehab PT Goals Patient Stated Goal: get better PT Goal Formulation: With patient Time For Goal Achievement: 03/19/18 Potential to Achieve Goals: Good Progress towards PT goals: Progressing toward goals    Frequency    Min 3X/week      PT Plan Current plan remains appropriate    Co-evaluation              AM-PAC PT "6 Clicks" Daily Activity  Outcome Measure  Difficulty turning over in bed (including adjusting bedclothes, sheets and blankets)?: None Difficulty moving from lying on back to sitting on the side of the bed? : None Difficulty sitting down on and standing up from a chair with arms (e.g., wheelchair, bedside commode, etc,.)?: None Help needed moving to and from a bed to chair (including a wheelchair)?: A Little Help needed walking in hospital room?: A Little Help needed climbing 3-5 steps with a railing? : A Little 6 Click Score: 21    End of Session Equipment Utilized During Treatment: Gait belt Activity Tolerance: Patient tolerated treatment well Patient left: in chair(sitting at sink washing up) Nurse Communication: Mobility status(pt at sink; educ to await for RN to supervise back to bed) PT Visit Diagnosis: Unsteadiness on feet (R26.81);Difficulty in walking, not elsewhere classified (R26.2)     Time: 5056-9794 PT Time Calculation (min) (ACUTE ONLY): 19 min  Charges:  $Gait Training: 8-22 mins                    G Codes:      Mabeline Caras, PT, DPT Acute Rehab Services  Pager: [place] 03/07/2018, 10:24 AM

## 2018-03-07 NOTE — Progress Notes (Signed)
PROGRESS NOTE    Rhonda Lynch  VEL:[id number] DOB: April 11, 1956 DOA: 02/12/2018 PCP: Patient, No Pcp Per   Brief Narrative: Patient is a 62 year old female with past medical history of cocaine/alcohol abuse who was admitted on 02/12/2018 with abdominal pain, nausea, vomiting.  She was found to have Streptococcus pneumonia  and also had acute kidney injury on presentation. Nephrology was consulted . 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  She has progressed to end-stage renal disease and is currently  undergoing dialysis, plasmapheresis and has been  started, on steroids and cyclophosphamide. Discharge planning after  setting of outpatient dialysis facility.  Received AV graft on 03/05/2018 with hematoma which was subsequently evacuated.  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  This has improved with decreasing the dose of Lantus to 5 units at bedtime.  Patient will likely need this lower dose of Lantus on discharge.  Left arm pain status post AV graft placement with subsequent hematoma status post evacuation on 03/06/2018 .  Mild anemia in the context of recent hematoma likely acute blood loss on the background of anemia of chronic kidney disease..  Might need blood transfusion if continues to goal below 7.  Could be considered with hemodialysis.   Check CBC in a.m.  Low-grade fever 03/06/2018.  T-max of 98.5 F in the last 24 hours.  Blood cultures drawn yesterday so negative cultures so far.  Chest x-ray done yesterday did not see any obvious infiltrate.  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  Nephrology on board.  Patient is on hemodialysis since 6/12 and received plasmapheresis for 7 days as well.  on prednisone and cyclophosphamide.  Cyclophosphamide started on 02/18/2018.  On Bactrim for PCP prophylaxis.  Status post tunnel catheter placement by IR.  AV graft placement on 03/05/2018 by vascular surgery.  Status post hematoma evacuation status post AV graft placement, on 03/06/2018.  Pancytopenia.  Trending down leukocytosis platelet count and anemia.  Patient is on cyclophosphamide and Bactrim.  Nephrology aware.  Will need to closely monitor.  Getting Aranesp.  If there is worsening pancytopenia, consider hematology evaluation.   Diffuse alveolar hemorrhage versus bronchiolitis obliterans or cryptogenic organizing pneumonia as per the imaging.  No active issues at this time.  X-ray did not show any obvious findings on 03/06/2018 and pulmonary followed the patient during hospitalization.  Currently stable.  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    Sepsis secondary to streptococcal pneumonia: Urine antigen was positive for streptococcus.  Blood cultures have been negative.  Completed the course of antibiotics.  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  Patient will need to follow-up with infectious disease as outpatient.    History of cocaine abuse: UDS was positive for cocaine on admission.  Currently stable.  No agitation or anxiety noted  Leukocytosis: WBC trending down.  On cyclophosphamide.  On steroids.  Will need to closely monitor for downtrending WBC.   DVT prophylaxis: SCD  Code Status: Full  Family Communication: No Family member  present at the bedside  Disposition Plan: Home with home health and physical therapy after setting of outpatient dialysis, pending nephrology clearance  Consultants: Nephrology, interventional radiology, pulmonary, vascular surgery  Procedures: Dialysis, plasmapheresis   Antimicrobials: None currently  Subjective:  Patient seen and examined at bedside.  Patient denies much pain on the arm today.  Patient denies any shortness of breath chest pain palpitation or fever.  Objective: Vitals:   03/06/18 1638 03/06/18 1755 03/06/18 2358 03/07/18 0822  BP: (!) 154/74 139/66 129/70 138/68  Pulse: 64 70 75 78  Resp: 15 18 19  (!) 21  Temp: 98.1 F (36.7 C) 98.5 F (36.9 C) 98.5 F (36.9 C) 98.3 F (36.8 C)  TempSrc: Oral Oral Oral Oral  SpO2: 99% 96% 97% 98%  Weight:      Height:        Intake/Output Summary (Last 24 hours) at 03/07/2018 1332 Last data filed at 03/07/2018 0300 Gross per 24 hour  Intake 540 ml  Output -  Net 540 ml   Filed Weights   03/05/18 1337 03/06/18 0500 03/06/18 0854  Weight: 76.9 kg (169 lb 8.5 oz) 76.1 kg (167 lb 12.3 oz) 74 kg (163 lb 2.3 oz)   Examination: General exam: Appears calm and comfortable ,Not in distress,average built HEENT:PERRL,Oral mucosa moist, Ear/Nose normal on gross exam Respiratory system: Bilateral equal air entry, normal vesicular breath sounds, no wheezes or crackles.  Right chest wall internal jugular tunneled hemodialysis catheter in place. Cardiovascular system: S1 & S2 heard, RRR. No JVD, murmurs, rubs, gallops or clicks. No pedal edema. Gastrointestinal system: Abdomen is nondistended, soft and nontender. No organomegaly or masses felt. Normal bowel sounds heard. Central nervous system: Alert and oriented. No focal neurological deficits. Extremities: No edema, no clubbing ,no cyanosis, distal peripheral pulses palpable.  Left upper extremity AV graft area with Ace compression bandage.  Distal capillary refill present. Skin: No  rashes, lesions or ulcers,no icterus ,no pallor MSK: Normal muscle bulk,tone ,power Psychiatry: Judgement and insight appear normal. Mood & affect appropriate.   Data Reviewed: I have personally reviewed following labs and imaging studies  CBC: Recent Labs  Lab 03/01/18 0317 03/03/18 0427 03/03/18 1735 03/04/18 0325 03/05/18 0328 03/06/18 0312 03/07/18 0316  WBC 16.6* 12.7*  --  13.9* 10.8* 6.1 6.5  NEUTROABS 14.9*  --   --  12.6* 8.9*  --   --   HGB 8.0* 7.5* 7.5* 7.8* 7.1* 7.5* 7.0*  HCT 24.4* 22.5* 22.0* 23.8* 21.8* 22.9* 21.7*  MCV 79.5 79.8  --  80.4 80.4 80.6 80.4  PLT 137* 142*  --  122* 100* 94* 71*   Basic Metabolic Panel: Recent Labs  Lab 03/01/18 0317 03/01/18 2110 03/02/18 0355 03/03/18 0427 03/03/18 1735 03/04/18 0325 03/05/18 0328 03/06/18 0312 03/07/18 0316  NA 139  138 135 139 137 136 138 137 140 137  K 4.8  4.8 3.6 4.1 4.1 4.6 4.6 3.9 4.7 4.3  CL 107  107 100* 104 103 99 104 102 104 99  CO2 23  23 25 28 26   --  24 29 27 29   GLUCOSE 238*  237* 231*  231* 134* 201* 210* 213* 96 106* 214*  BUN 60*  60* 26* 34* 63* 69* 85* 47* 61* 36*  CREATININE 5.19*  5.22* 3.00* 3.55* 5.22* 6.30* 6.41*  4.11* 5.21* 3.52*  CALCIUM 8.2*  8.1* 7.9* 8.1* 8.0*  --  8.0* 7.5* 7.5* 7.3*  PHOS 3.4 3.1 4.1 3.6  --   --   --   --   --    GFR: Estimated Creatinine Clearance: 14.9 mL/min (A) (by C-G formula based on SCr of 3.52 mg/dL (H)). Liver Function Tests: Recent Labs  Lab 03/01/18 0317 03/01/18 2110 03/02/18 0355 03/03/18 0427 03/06/18 0312  AST  --   --   --   --  33  ALT  --   --   --   --  18  ALKPHOS  --   --   --   --  37*  BILITOT  --   --   --   --  0.4  PROT  --   --   --   --  4.1*  ALBUMIN 3.9 3.5 3.5 3.2* 3.2*   No results for input(s): LIPASE, AMYLASE in the last 168 hours. No results for input(s): AMMONIA in the last 168 hours. Coagulation Profile: No results for input(s): INR, PROTIME in the last 168 hours. Cardiac Enzymes: No results  for input(s): CKTOTAL, CKMB, CKMBINDEX, TROPONINI in the last 168 hours. BNP (last 3 results) No results for input(s): PROBNP in the last 8760 hours. HbA1C: No results for input(s): HGBA1C in the last 72 hours. CBG: Recent Labs  Lab 03/06/18 1753 03/06/18 2035 03/06/18 2237 03/07/18 0804 03/07/18 1129  GLUCAP 256* 320* 264* 107* 196*   Lipid Profile: No results for input(s): CHOL, HDL, LDLCALC, TRIG, CHOLHDL, LDLDIRECT in the last 72 hours. Thyroid Function Tests: No results for input(s): TSH, T4TOTAL, FREET4, T3FREE, THYROIDAB in the last 72 hours. Anemia Panel: No results for input(s): VITAMINB12, FOLATE, FERRITIN, TIBC, IRON, RETICCTPCT in the last 72 hours. Sepsis Labs: No results for input(s): PROCALCITON, LATICACIDVEN in the last 168 hours.  Recent Results (from the past 240 hour(s))  Surgical pcr screen     Status: None   Collection Time: 03/04/18  4:52 AM  Result Value Ref Range Status   MRSA, PCR NEGATIVE NEGATIVE Final   Staphylococcus aureus NEGATIVE NEGATIVE Final    Comment: (NOTE) The Xpert SA Assay (FDA approved for NASAL specimens in patients 33 years of age and older), is one component of a comprehensive surveillance program. It is not intended to diagnose infection nor to guide or monitor treatment. Performed at [facility], [place] [address], [place], [place] [address]   Culture, blood (routine x 2)     Status: None (Preliminary result)   Collection Time: 03/06/18  6:30 PM  Result Value Ref Range Status   Specimen Description BLOOD RIGHT ARM  Final   Special Requests   Final    BOTTLES DRAWN AEROBIC AND ANAEROBIC Blood Culture adequate volume   Culture   Final    NO GROWTH < 12 HOURS Performed at [facility], [place] [address] [place], [place] [address]    Report Status PENDING  Incomplete  Culture, blood (routine x 2)     Status: None (Preliminary result)   Collection Time: 03/06/18  6:37 PM  Result Value Ref Range Status   Specimen  Description BLOOD RIGHT ARM  Final   Special Requests   Final    BOTTLES DRAWN AEROBIC AND ANAEROBIC Blood Culture adequate volume   Culture   Final    NO GROWTH < 12 HOURS Performed at [facility], [place] [address], [place], [place] [address]  Report Status PENDING  Incomplete       Radiology Studies: Dg Chest Port 1 View  Result Date: 03/06/2018 CLINICAL DATA:  Fever. EXAM: PORTABLE CHEST 1 VIEW COMPARISON:  CT 02/17/2018.  Chest x-ray 02/14/2018. FINDINGS: Right IJ dual-lumen catheter with tip over cavoatrial junction. Cardiomegaly. No focal infiltrate. No pleural effusion or pneumothorax. No acute bony abnormality. J IMPRESSION: 1.  Right IJ dual-lumen catheter with tip over cavoatrial junction. 2. No acute cardiopulmonary disease. No evidence of pulmonary infiltrate. Electronically Signed   By: Marcello Moores  Register   On: 03/06/2018 13:47     Scheduled Meds: . Chlorhexidine Gluconate Cloth  6 each Topical Q0600  . cyclophosphamide  100 mg Oral Daily  . darbepoetin (ARANESP) injection - DIALYSIS  40 mcg Intravenous Q Tue-HD  . famotidine  20 mg Oral Daily  . insulin aspart  0-9 Units Subcutaneous TID WC  . insulin aspart  3 Units Subcutaneous TID WC  . insulin glargine  5 Units Subcutaneous QHS  . multivitamin with minerals  1 tablet Oral Daily  . polyethylene glycol  17 g Oral Daily  . predniSONE  60 mg Oral Q breakfast  . sodium chloride flush  3 mL Intravenous Q12H  . sulfamethoxazole-trimethoprim  1 tablet Oral Once per day on Mon Wed Fri  . thiamine  100 mg Oral Daily   Continuous Infusions: . sodium chloride 10 mL/hr at 03/05/18 1341  . sodium chloride 10 mL/hr at 03/06/18 1429     LOS: 23 days    Time spent: 25 mins.More than 50% of that time was spent in counseling and/or coordination of care.   Flora Lipps, MD [facility] Pager [phone]  If 7PM-7AM, please contact night-coverage [email or web address] Password [email or web address] 03/07/2018, 1:32 PM

## 2018-03-07 NOTE — Progress Notes (Signed)
[facility] NEPHROLOGY PROGRESS NOTE  Assessment/ Plan: Pt is a 62 y.o. yo female with history of cocaine and alcohol abuse admitted with abdominal pain, nausea vomiting due to septic secondary to Streptococcus pneumonia.  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  Seen by pulmonologist. -received plasma exchange for 7 days.   -Started hemodialysis on 6/12. TDC was placed on 6/19.  AV fistula was created on 6/26.  No sign of renal recovery yet. S/p dialysis yesterday, tolerated well.  Next dialysis tomorrow.  CLIP in progress.   -Patient received Solu-Medrol pulse from 6/11 to 6/13, now on prednisone 60 mill grams daily with plan to slow taper.  -Started cyclophosphamide 100 mg daily since 02/18/2018.  Monitor for leukopenia.  WBC count is trending down, wbc 6.5 -On Bactrim for PCP prophylaxis -The biopsy showed 70% of the glomeruli affected although mild IFTA, unknown how much renal recovery patient will have. Watch for renal recovery.  #Left upper extremity pain and swelling status post AV fistula creation: taken to OR on 6/27 the exploration and evacuation of small left arm hematoma.  The swelling and pain is better today.  # Anemia: iron sat 82 %, starting aranesp weekly.  #Polysubstance abuse: Urine positive for cocaine.  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  # HCV Ab positive: Recommended to follow-up outpatient for possible treatment.  #Dispo: waiting for outpatient dialysis arrangement.  Subjective: Seen and examined at bedside.  Left arm pain and swelling is better today.  Denied nausea, vomiting, chest pain, shortness of  breath.  Objective Vital signs in last 24 hours: Vitals:   03/06/18 1638 03/06/18 1755 03/06/18 2358 03/07/18 0822  BP: (!) 154/74 139/66 129/70 138/68  Pulse: 64 70 75 78  Resp: 15 18 19  (!) 21  Temp: 98.1 F (36.7 C) 98.5 F (36.9 C) 98.5 F (36.9 C) 98.3 F (36.8 C)  TempSrc: Oral Oral Oral Oral  SpO2: 99% 96% 97% 98%  Weight:      Height:       Weight change: -2.9 kg (-6 lb 6.3 oz)  Intake/Output Summary (Last 24 hours) at 03/07/2018 1057 Last data filed at 03/07/2018 0300 Gross per 24 hour  Intake 540 ml  Output 2000 ml  Net -1460 ml       Labs: Basic Metabolic Panel: Recent Labs  Lab 03/01/18 2110 03/02/18 0355 03/03/18 0427  03/05/18 0328 03/06/18 0312 03/07/18 0316  NA 135 139 137   < > 137 140 137  K 3.6 4.1 4.1   < > 3.9 4.7 4.3  CL 100* 104 103   < > 102 104 99  CO2 25 28 26    < > 29 27 29   GLUCOSE 231*  231* 134* 201*   < > 96 106* 214*  BUN 26* 34* 63*   < > 47* 61* 36*  CREATININE 3.00* 3.55* 5.22*   < > 4.11* 5.21* 3.52*  CALCIUM 7.9* 8.1* 8.0*   < > 7.5* 7.5* 7.3*  PHOS 3.1 4.1 3.6  --   --   --   --    < > = values in this interval not displayed.   Liver Function Tests: Recent Labs  Lab 03/02/18 0355 03/03/18 0427 03/06/18  8403  AST  --   --  33  ALT  --   --  18  ALKPHOS  --   --  37*  BILITOT  --   --  0.4  PROT  --   --  4.1*  ALBUMIN 3.5 3.2* 3.2*   No results for input(s): LIPASE, AMYLASE in the last 168 hours. No results for input(s): AMMONIA in the last 168 hours. CBC: Recent Labs  Lab 03/01/18 0317 03/03/18 0427  03/04/18 0325 03/05/18 0328 03/06/18 0312 03/07/18 0316  WBC 16.6* 12.7*  --  13.9* 10.8* 6.1 6.5  NEUTROABS 14.9*  --   --  12.6* 8.9*  --   --   HGB 8.0* 7.5*   < > 7.8* 7.1* 7.5* 7.0*  HCT 24.4* 22.5*   < > 23.8* 21.8* 22.9* 21.7*  MCV 79.5 79.8  --  80.4 80.4 80.6 80.4  PLT 137* 142*  --  122* 100* 94* 71*   < > = values in this interval not displayed.   Cardiac Enzymes: No results for input(s):  CKTOTAL, CKMB, CKMBINDEX, TROPONINI in the last 168 hours. CBG: Recent Labs  Lab 03/06/18 1544 03/06/18 1753 03/06/18 2035 03/06/18 2237 03/07/18 0804  GLUCAP 169* 256* 320* 264* 107*    Iron Studies:  No results for input(s): IRON, TIBC, TRANSFERRIN, FERRITIN in the last 72 hours. Studies/Results: Dg Chest Port 1 View  Result Date: 03/06/2018 CLINICAL DATA:  Fever. EXAM: PORTABLE CHEST 1 VIEW COMPARISON:  CT 02/17/2018.  Chest x-ray 02/14/2018. FINDINGS: Right IJ dual-lumen catheter with tip over cavoatrial junction. Cardiomegaly. No focal infiltrate. No pleural effusion or pneumothorax. No acute bony abnormality. J IMPRESSION: 1.  Right IJ dual-lumen catheter with tip over cavoatrial junction. 2. No acute cardiopulmonary disease. No evidence of pulmonary infiltrate. Electronically Signed   By: Marcello Moores  Register   On: 03/06/2018 13:47    Medications: Infusions: . sodium chloride 10 mL/hr at 03/05/18 1341  . sodium chloride 10 mL/hr at 03/06/18 1429    Scheduled Medications: . Chlorhexidine Gluconate Cloth  6 each Topical Q0600  . cyclophosphamide  100 mg Oral Daily  . darbepoetin (ARANESP) injection - DIALYSIS  40 mcg Intravenous Q Tue-HD  . famotidine  20 mg Oral Daily  . insulin aspart  0-9 Units Subcutaneous TID WC  . insulin aspart  3 Units Subcutaneous TID WC  . insulin glargine  5 Units Subcutaneous QHS  . multivitamin with minerals  1 tablet Oral Daily  . polyethylene glycol  17 g Oral Daily  . predniSONE  60 mg Oral Q breakfast  . sodium chloride flush  3 mL Intravenous Q12H  . sulfamethoxazole-trimethoprim  1 tablet Oral Once per day on Mon Wed Fri  . thiamine  100 mg Oral Daily    have reviewed scheduled and prn medications.  Physical Exam: General: Not in distress Heart: Regular rate rhythm S1-S2 normal l Lungs: Clear bilateral Abdomen: Soft, nontender, nondistended Extremities: No edema Dialysis Access: Right IJ TDC catheter.  Upper extremity AV fistula  site has good thrill and bruit, swelling has subsided. Johncarlo Maalouf Prasad Daphnie Venturini 03/07/2018,10:57 AM  LOS: 23 days

## 2018-03-07 NOTE — Progress Notes (Signed)
  Progress Note    03/07/2018 11:44 AM 1 Day Post-Op  Subjective:  Pain much improved L arm   Vitals:   03/06/18 2358 03/07/18 0822  BP: 129/70 138/68  Pulse: 75 78  Resp: 19 (!) 21  Temp: 98.5 F (36.9 C) 98.3 F (36.8 C)  SpO2: 97% 98%   Physical Exam: Lungs:  Non labored on RA Incisions:  L AC fossa incisions c/d/i; local ecchymosis Extremities:  Palpable L radial 1+; palpable thrill and audible bruit L upper arm Abdomen:  Soft Neurologic: A&O  CBC    Component Value Date/Time   WBC 6.5 03/07/2018 0316   RBC 2.70 (L) 03/07/2018 0316   HGB 7.0 (L) 03/07/2018 0316   HCT 21.7 (L) 03/07/2018 0316   PLT 71 (L) 03/07/2018 0316   MCV 80.4 03/07/2018 0316   MCH 25.9 (L) 03/07/2018 0316   MCHC 32.3 03/07/2018 0316   RDW 20.7 (H) 03/07/2018 0316   LYMPHSABS 1.1 03/05/2018 0328   MONOABS 0.7 03/05/2018 0328   EOSABS 0.0 03/05/2018 0328   BASOSABS 0.0 03/05/2018 0328    BMET    Component Value Date/Time   NA 137 03/07/2018 0316   K 4.3 03/07/2018 0316   CL 99 03/07/2018 0316   CO2 29 03/07/2018 0316   GLUCOSE 214 (H) 03/07/2018 0316   BUN 36 (H) 03/07/2018 0316   CREATININE 3.52 (H) 03/07/2018 0316   CALCIUM 7.3 (L) 03/07/2018 0316   GFRNONAA 13 (L) 03/07/2018 0316   GFRAA 15 (L) 03/07/2018 0316    INR    Component Value Date/Time   INR 1.20 02/25/2018 1559     Intake/Output Summary (Last 24 hours) at 03/07/2018 1144 Last data filed at 03/07/2018 0300 Gross per 24 hour  Intake 540 ml  Output 2000 ml  Net -1460 ml     Assessment/Plan:  62 y.o. female is s/p L brachiocephalic fistula with subsequent evac of hematoma 1 Day Post-Op   Patent fistula with palpable thrill and audible bruit Ok for discharge from vascular surgery standpoint Follow up in office in about 4-6 weeks   Dagoberto Ligas, PA-C [facility] [phone] 03/07/2018 11:44 AM

## 2018-03-08 DIAGNOSIS — R509 Fever, unspecified: Secondary | ICD-10-CM

## 2018-03-08 LAB — BASIC METABOLIC PANEL
Anion gap: 8 (ref 5–15)
BUN: 61 mg/dL — ABNORMAL HIGH (ref 8–23)
CALCIUM: 7.8 mg/dL — AB (ref 8.9–10.3)
CO2: 30 mmol/L (ref 22–32)
CREATININE: 5.47 mg/dL — AB (ref 0.44–1.00)
Chloride: 99 mmol/L (ref 98–111)
GFR calc Af Amer: 9 mL/min — ABNORMAL LOW (ref >60)
GFR calc non Af Amer: 7 mL/min — ABNORMAL LOW (ref >60)
GLUCOSE: 152 mg/dL — AB (ref 70–99)
Potassium: 4.7 mmol/L (ref 3.5–5.1)
Sodium: 137 mmol/L (ref 135–145)

## 2018-03-08 LAB — CBC
HEMATOCRIT: 21.3 % — AB (ref 36.0–46.0)
Hemoglobin: 7 g/dL — ABNORMAL LOW (ref 12.0–15.0)
MCH: 26.4 pg (ref 26.0–34.0)
MCHC: 32.9 g/dL (ref 30.0–36.0)
MCV: 80.4 fL (ref 78.0–100.0)
Platelets: 84 10*3/uL — ABNORMAL LOW (ref 150–400)
RBC: 2.65 MIL/uL — ABNORMAL LOW (ref 3.87–5.11)
RDW: 20.5 % — AB (ref 11.5–15.5)
WBC: 8.7 10*3/uL (ref 4.0–10.5)

## 2018-03-08 LAB — GLUCOSE, CAPILLARY
GLUCOSE-CAPILLARY: 214 mg/dL — AB (ref 70–99)
GLUCOSE-CAPILLARY: 162 mg/dL — AB (ref 70–99)
Glucose-Capillary: 141 mg/dL — ABNORMAL HIGH (ref 70–99)
Glucose-Capillary: 181 mg/dL — ABNORMAL HIGH (ref 70–99)
Glucose-Capillary: 142 mg/dL — ABNORMAL HIGH (ref 70–99)

## 2018-03-08 NOTE — Procedures (Signed)
Patient was seen on dialysis and the procedure was supervised.  BFR 400  Via TDC BP is  136/72.  Hb 7, plt 84, wbc 8.7. Continue current meds. Next HD on Tuesday. Pending CLIP   Patient appears to be tolerating treatment well  Rhonda Lynch Tanna Furry 03/08/2018

## 2018-03-08 NOTE — Progress Notes (Addendum)
[name], female   DOB: [birth year], 62 y.o.   MRN: [id number]                                                                PROGRESS NOTE                                                                                                                                                                                                             Patient Demographics:    Rhonda Lynch, is a 62 y.o. female, DOB - Aug 30, 1956, EVO:[id number]  Admit date - 02/12/2018   Admitting Physician Vianne Bulls, MD  Outpatient Primary MD for the patient is Patient, No Pcp Per  LOS - 24  Outpatient Specialists:     Chief Complaint  Patient presents with  . Abdominal Pain       Brief Narrative  62 year old female with past medical history of cocaine/alcohol abuse who was admitted on 02/12/2018 with abdominal pain, nausea, vomiting.  She was found to have Streptococcus pneumonia  and also had acute kidney injury on presentation. Nephrology was consulted . 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  She has progressed to end-stage renal disease and is currently  undergoing dialysis, plasmapheresis and has been  started, on steroids and cyclophosphamide. Discharge planning after  setting of outpatient dialysis facility.  Received AV graft on 03/05/2018 with hematoma which was subsequently evacuated.      Subjective:    Rhonda Lynch today has no events overnite.  Pt had L brachiocephalic fistula 2/99, and then exploration of small hematoma 6/27.  Pt is doing well.  Pt has not yet been assigned a dialysis center.   No headache, No chest pain, No abdominal pain - No Nausea, No new weakness tingling or numbness, No Cough - SOB.    Assessment  & Plan :    Principal Problem:   PNEUMONIA Active Problems:  ELEVATED BLOOD PRESSURE WITHOUT DIAGNOSIS OF HYPERTENSION   Renal failure   Acute respiratory failure with hypoxia (HCC)   Alcohol abuse   Microcytic anemia   Cocaine abuse (HCC)   Epigastric pain   Pneumonia   Pulmonary infiltrate  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[place])   Diffuse pulmonary alveolar hemorrhage   Hypoxemia   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CC)   Hypoglycemia with a history of diabetes mellitus.  This has improved with decreasing the dose of Lantus to 5 units at bedtime.  Patient will likely need this lower dose of Lantus on discharge.  Left arm pain status post AV graft 03/05/2018 placement with subsequent hematoma status post evacuation on 03/06/2018 .  Mild anemia in the context of recent hematoma likely acute blood loss on the background of anemia of chronic kidney disease..  Might need blood transfusion if continues to goal below 7.  Could be considered with hemodialysis.   Check CBC in a.m.  Low-grade fever 03/06/2018. Since then afebrileBlood cultures drawn 6/27negative  Chest x-ray done yesterday did not see any obvious infiltrate.  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  Nephrology on board.  Patient is on hemodialysis since 6/12 and received plasmapheresis for 7 days as well.  on prednisone and cyclophosphamide.  Cyclophosphamide started on 02/18/2018.  On Bactrim for PCP prophylaxis.  Status post tunnel catheter placement by IR.  AV graft placement on 03/05/2018 by vascular surgery.  Status post hematoma evacuation status post AV graft placement, on 03/06/2018.  Pancytopenia.  Trending down leukocytosis platelet count and anemia.  Patient is on cyclophosphamide and Bactrim.  Nephrology aware.  Will need to closely monitor.  Getting Aranesp.  If there is worsening pancytopenia, consider hematology evaluation.   Diffuse alveolar hemorrhage versus bronchiolitis obliterans or cryptogenic organizing pneumonia as  per the imaging.  No active issues at this time.  X-ray did not show any obvious findings on 03/06/2018 and pulmonary followed the patient during hospitalization.  Currently stable.  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    Sepsis secondary to streptococcal pneumonia: Urine antigen was positive for streptococcus.  Blood cultures have been negative.  Completed the course of antibiotics.  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  Patient will need to follow-up with infectious disease as outpatient.    History of cocaine abuse: UDS was positive for cocaine on admission.  Currently stable.  No agitation or anxiety noted  Leukocytosis: WBC trending down.  On cyclophosphamide.  On steroids.  Will need to closely monitor for downtrending WBC.      Code Status : FULL CODE  Family Communication  : w patient  Disposition Plan  : home once cleared by nephrology  Barriers For Discharge :  CLIP  Consults  :  nephrology  Procedures  :  L AVF 6/26, and L AVF exploration 6/27  DVT Prophylaxis  :  Heparin - SCDs   Lab Results  Component Value Date   PLT 84 (L) 03/08/2018    Antibiotics  :  cephazolin 6/27   Anti-infectives (From admission, onward)   Start     Dose/Rate Route Frequency Ordered Stop   03/06/18 1425  ceFAZolin (ANCEF) 2-4 GM/100ML-% IVPB    Note to Pharmacy:  [facility]   : cabinet override      03/06/18 1425 03/07/18 0229   03/05/18 1415  ceFAZolin (ANCEF) IVPB 2g/100 mL premix     2 g 200 mL/hr over 30 Minutes Intravenous  Once 03/05/18 1401 03/05/18 1445   03/05/18 1403  ceFAZolin (ANCEF) 2-4 GM/100ML-% IVPB    Note to Pharmacy:  [facility]   : cabinet override  03/05/18 1403 03/05/18 1445   02/26/18 1615  ceFAZolin (ANCEF) IVPB 2g/100 mL premix  Status:  Discontinued     2 g 200 mL/hr over 30 Minutes Intravenous  Once 02/26/18 1608 03/04/18 1020   02/26/18 1556  ceFAZolin (ANCEF) 2-4  GM/100ML-% IVPB    Note to Pharmacy:  [facility]   : cabinet override      02/26/18 1556 02/26/18 1630   02/21/18 0900  sulfamethoxazole-trimethoprim (BACTRIM DS,SEPTRA DS) 800-160 MG per tablet 1 tablet     1 tablet Oral Once per day on Mon Wed Fri 02/20/18 1116     02/13/18 1900  cefTRIAXone (ROCEPHIN) 1 g in sodium chloride 0.9 % 100 mL IVPB     1 g 200 mL/hr over 30 Minutes Intravenous Every 24 hours 02/12/18 2122 02/18/18 2014   02/13/18 1900  azithromycin (ZITHROMAX) 500 mg in sodium chloride 0.9 % 250 mL IVPB  Status:  Discontinued     500 mg 250 mL/hr over 60 Minutes Intravenous Every 24 hours 02/12/18 2122 02/14/18 0819   02/12/18 1915  cefTRIAXone (ROCEPHIN) 2 g in sodium chloride 0.9 % 100 mL IVPB  Status:  Discontinued     2 g 200 mL/hr over 30 Minutes Intravenous Every 24 hours 02/12/18 1901 02/12/18 2125   02/12/18 1915  azithromycin (ZITHROMAX) 500 mg in sodium chloride 0.9 % 250 mL IVPB  Status:  Discontinued     500 mg 250 mL/hr over 60 Minutes Intravenous Every 24 hours 02/12/18 1901 02/12/18 2125        Objective:   Vitals:   03/08/18 1115 03/08/18 1145 03/08/18 1215 03/08/18 1219  BP: 96/78 107/61 (!) 114/49 (!) 119/59  Pulse: 84 81 69 78  Resp:    20  Temp:    98.7 F (37.1 C)  TempSrc:    Oral  SpO2:    95%  Weight:    73.6 kg (162 lb 4.1 oz)  Height:        Wt Readings from Last 3 Encounters:  03/08/18 73.6 kg (162 lb 4.1 oz)     Intake/Output Summary (Last 24 hours) at 03/08/2018 1420 Last data filed at 03/08/2018 1219 Gross per 24 hour  Intake 1206 ml  Output 1494 ml  Net -288 ml     Physical Exam  Awake Alert, Oriented X 3, No new F.N deficits, Normal affect [place].AT,PERRAL Supple Neck,No JVD, No cervical lymphadenopathy appriciated.  Symmetrical Chest wall movement, Good air movement bilaterally, CTAB RRR,No Gallops,Rubs or new Murmurs, No Parasternal Heave +ve B.Sounds, Abd Soft, No tenderness, No organomegaly appriciated, No  rebound - guarding or rigidity. No Cyanosis, Clubbing or edema, No new Rash or bruise   L AVF    Data Review:    CBC Recent Labs  Lab 03/04/18 0325 03/05/18 0328 03/06/18 0312 03/07/18 0316 03/08/18 0231  WBC 13.9* 10.8* 6.1 6.5 8.7  HGB 7.8* 7.1* 7.5* 7.0* 7.0*  HCT 23.8* 21.8* 22.9* 21.7* 21.3*  PLT 122* 100* 94* 71* 84*  MCV 80.4 80.4 80.6 80.4 80.4  MCH 26.4 26.2 26.4 25.9* 26.4  MCHC 32.8 32.6 32.8 32.3 32.9  RDW 20.9* 21.0* 21.3* 20.7* 20.5*  LYMPHSABS 0.5* 1.1  --   --   --   MONOABS 0.6 0.7  --   --   --   EOSABS 0.0 0.0  --   --   --   BASOSABS 0.0 0.0  --   --   --     Chemistries  Recent Labs  Lab 03/04/18 0325 03/05/18 0328 03/06/18 0312 03/07/18 0316 03/08/18 0231  NA 138 137 140 137 137  K 4.6 3.9 4.7 4.3 4.7  CL 104 102 104 99 99  CO2 24 29 27 29 30   GLUCOSE 213* 96 106* 214* 152*  BUN 85* 47* 61* 36* 61*  CREATININE 6.41* 4.11* 5.21* 3.52* 5.47*  CALCIUM 8.0* 7.5* 7.5* 7.3* 7.8*  AST  --   --  33  --   --   ALT  --   --  18  --   --   ALKPHOS  --   --  37*  --   --   BILITOT  --   --  0.4  --   --    ------------------------------------------------------------------------------------------------------------------ No results for input(s): CHOL, HDL, LDLCALC, TRIG, CHOLHDL, LDLDIRECT in the last 72 hours.  Lab Results  Component Value Date   HGBA1C 6.8 (H) 03/03/2018   ------------------------------------------------------------------------------------------------------------------ No results for input(s): TSH, T4TOTAL, T3FREE, THYROIDAB in the last 72 hours.  Invalid input(s): FREET3 ------------------------------------------------------------------------------------------------------------------ No results for input(s): VITAMINB12, FOLATE, FERRITIN, TIBC, IRON, RETICCTPCT in the last 72 hours.  Coagulation profile No results for input(s): INR, PROTIME in the last 168 hours.  No results for input(s): DDIMER in the last 72  hours.  Cardiac Enzymes No results for input(s): CKMB, TROPONINI, MYOGLOBIN in the last 168 hours.  Invalid input(s): CK ------------------------------------------------------------------------------------------------------------------    Component Value Date/Time   BNP 225.7 (H) 02/12/2018 1534    Inpatient Medications  Scheduled Meds: . Chlorhexidine Gluconate Cloth  6 each Topical Q0600  . cyclophosphamide  100 mg Oral Daily  . darbepoetin (ARANESP) injection - DIALYSIS  40 mcg Intravenous Q Tue-HD  . famotidine  20 mg Oral Daily  . insulin aspart  0-9 Units Subcutaneous TID WC  . insulin aspart  3 Units Subcutaneous TID WC  . insulin glargine  5 Units Subcutaneous QHS  . multivitamin with minerals  1 tablet Oral Daily  . polyethylene glycol  17 g Oral Daily  . predniSONE  60 mg Oral Q breakfast  . sodium chloride flush  3 mL Intravenous Q12H  . sulfamethoxazole-trimethoprim  1 tablet Oral Once per day on Mon Wed Fri  . thiamine  100 mg Oral Daily   Continuous Infusions: . sodium chloride 10 mL/hr at 03/05/18 1341  . sodium chloride 10 mL/hr at 03/06/18 1429   PRN Meds:.alum & mag hydroxide-simeth, haloperidol lactate, hydrALAZINE, HYDROcodone-acetaminophen, HYDROmorphone (DILAUDID) injection, LORazepam, ondansetron **OR** ondansetron (ZOFRAN) IV, senna-docusate, zolpidem  Micro Results Recent Results (from the past 240 hour(s))  Surgical pcr screen     Status: None   Collection Time: 03/04/18  4:52 AM  Result Value Ref Range Status   MRSA, PCR NEGATIVE NEGATIVE Final   Staphylococcus aureus NEGATIVE NEGATIVE Final    Comment: (NOTE) The Xpert SA Assay (FDA approved for NASAL specimens in patients 53 years of age and older), is one component of a comprehensive surveillance program. It is not intended to diagnose infection nor to guide or monitor treatment. Performed at [facility], [place] [address], [place], [place] [address]   Culture, blood (routine x 2)      Status: None (Preliminary result)   Collection Time: 03/06/18  6:30 PM  Result Value Ref Range Status   Specimen Description BLOOD RIGHT ARM  Final   Special Requests   Final    BOTTLES DRAWN AEROBIC AND ANAEROBIC Blood Culture adequate volume   Culture  Final    NO GROWTH 2 DAYS Performed at [facility], [place] [address], [place], [place] [address]    Report Status PENDING  Incomplete  Culture, blood (routine x 2)     Status: None (Preliminary result)   Collection Time: 03/06/18  6:37 PM  Result Value Ref Range Status   Specimen Description BLOOD RIGHT ARM  Final   Special Requests   Final    BOTTLES DRAWN AEROBIC AND ANAEROBIC Blood Culture adequate volume   Culture   Final    NO GROWTH 2 DAYS Performed at [facility], [place] [address], [place], [place] [address]    Report Status PENDING  Incomplete    Radiology Reports Ct Abdomen Pelvis Wo Contrast  Result Date: 02/12/2018 CLINICAL DATA:  Epigastric abdominal pain for the past week. EXAM: CT ABDOMEN AND PELVIS WITHOUT CONTRAST TECHNIQUE: Multidetector CT imaging of the abdomen and pelvis was performed following the standard protocol without IV contrast. COMPARISON:  CT abdomen pelvis dated February 19, 2010. FINDINGS: Lower chest: Confluent ground-glass density at the right greater than left lung bases with inter and intralobular septal thickening, consistent with crazy paving pattern. This spares the subpleural lung. Hepatobiliary: No focal liver abnormality is seen. Status post cholecystectomy. Mild central intrahepatic and common bile duct dilatation, likely related to post cholecystectomy state. Pancreas: Unremarkable. No pancreatic ductal dilatation or surrounding inflammatory changes. Spleen: Normal in size without focal abnormality. Adrenals/Urinary Tract: Adrenal glands are unremarkable. Kidneys are normal, without renal calculi, focal lesion, or hydronephrosis. Bladder is unremarkable. Stomach/Bowel: Stomach is within  normal limits. Appendix appears normal. No evidence of bowel wall thickening, distention, or inflammatory changes. Vascular/Lymphatic: Aortic atherosclerosis. No enlarged abdominal or pelvic lymph nodes. Reproductive: Prior hysterectomy. 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 Other: Tiny fat containing umbilical hernia. No free fluid or pneumoperitoneum. Musculoskeletal: No acute or significant osseous findings. 3 mm anterolisthesis at L4-L5 due to severe facet arthropathy. Which IMPRESSION: 1.  No acute intra-abdominal process. 2. Confluent ground-glass density and inter and intralobular septal thickening at the right greater than left lung bases, consistent with crazy paving pattern. While this pattern is nonspecific, given the sparing of the subpleural lung, this may be related to cryptogenic organizing pneumonia. Pulmonology consultation is recommended. 3. 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 Electronically Signed   By: Titus Dubin M.D.   On: 02/12/2018 20:01   Dg Chest 1 View  Result Date: 02/14/2018 CLINICAL DATA:  Shortness of breath EXAM: CHEST  1 VIEW COMPARISON:  02/12/2018 FINDINGS: Severe diffuse bilateral disease throughout the lungs, stable. Cardiomegaly. No visible effusions or acute bony abnormality. IMPRESSION: Severe bilateral airspace disease could reflect edema/CHF or infection. No change. Electronically Signed   By: Rolm Baptise M.D.   On: 02/14/2018 09:38   US Renal  Result Date: 02/14/2018 CLINICAL DATA:  Acute kidney injury. EXAM: RENAL / URINARY TRACT ULTRASOUND COMPLETE COMPARISON:  CT abdomen pelvis dated February 12, 2018. FINDINGS: Right Kidney: Length: 11.0 cm. Increased echogenicity. No mass or hydronephrosis visualized. Left Kidney: Length: 10.9 cm. Increased echogenicity. No mass or hydronephrosis visualized. Bladder: Decompressed by a Foley catheter. IMPRESSION: Increased renal cortical echogenicity bilaterally, consistent with medical renal disease. Electronically  Signed   By: Titus Dubin M.D.   On: 02/14/2018 22:34   Ct Chest High Resolution  Result Date: 02/18/2018 CLINICAL DATA:  61 year old female with history of alcohol and cocaine abuse presenting with dyspnea and productive cough. Abnormal chest x-ray. EXAM:  CT CHEST WITHOUT CONTRAST TECHNIQUE: Multidetector CT imaging of the chest was performed following the standard protocol without intravenous contrast. High resolution imaging of the lungs, as well as inspiratory and expiratory imaging, was performed. COMPARISON:  No prior chest CT.  Chest x-ray 02/14/2018. FINDINGS: Cardiovascular: Heart size is mildly enlarged. There is no significant pericardial fluid, thickening or pericardial calcification. Aortic atherosclerosis. No definite coronary artery calcifications. Mediastinum/Nodes: No pathologically enlarged mediastinal or hilar lymph nodes. Please note that accurate exclusion of hilar adenopathy is limited on noncontrast CT scans. Esophagus is unremarkable in appearance. No axillary lymphadenopathy. Lungs/Pleura: High-resolution images demonstrate widespread areas of ground-glass attenuation and septal thickening, as well as widespread areas of peripheral bronchiolectasis. Subpleural sparing is noted, particularly throughout the mid to lower lungs. These findings have no definitive craniocaudal gradient, but appear slightly more pronounced throughout the mid to upper lungs than the lower lungs. No honeycombing. Inspiratory and expiratory imaging is unremarkable. Linear area of scarring or atelectasis in the periphery of the left lower lobe. Trace left pleural effusion lying dependently. Upper Abdomen: Unremarkable. Musculoskeletal: There are no aggressive appearing lytic or blastic lesions noted in the visualized portions of the skeleton. IMPRESSION: 1. The appearance of the lungs is nonspecific, but there are imaging findings that are suggestive of cryptogenic organizing pneumonia (COP). Other differential  considerations include both cardiogenic and noncardiogenic edema, however, this is not strongly favored. Evaluation by Pulmonology is strongly recommended. 2. Aortic atherosclerosis. Aortic Atherosclerosis (ICD10-I70.0). Electronically Signed   By: Vinnie Langton M.D.   On: 02/18/2018 08:37   Ir Fluoro Guide Cv Line Right  Result Date: 02/26/2018 INDICATION: 56 year old with acute kidney injury and on dialysis. Patient currently has a non tunneled dialysis catheter and needs conversion to a tunneled dialysis catheter. EXAM: CONVERSION OF NON TUNNELED DIALYSIS CATHETER TO A TUNNELED DIALYSIS CATHETER WITH FLUOROSCOPY Physician: Stephan Minister. Anselm Pancoast, MD MEDICATIONS: Ancef 2 g; The antibiotic was administered within an appropriate time interval prior to skin puncture. ANESTHESIA/SEDATION: Versed 1.5 mg IV; Fentanyl 100 mcg IV; Moderate Sedation Time:  20 minutes The patient was continuously monitored during the procedure by the interventional radiology nurse under my direct supervision. FLUOROSCOPY TIME:  Fluoroscopy Time: 18 seconds, 1 mGy COMPLICATIONS: None immediate. PROCEDURE: The existing right jugular dialysis catheter was evaluated. This catheter was felt to be adequate for conversion to a tunneled catheter. The procedure was explained to the patient. The risks and benefits of the procedure were discussed and the patient's questions were addressed. Informed consent was obtained from the patient. The existing catheter and the right upper chest were prepped and draped in a sterile fashion. Maximal barrier sterile technique was utilized including caps, mask, sterile gowns, sterile gloves, sterile drape, hand hygiene and skin antiseptic. The skin below the right clavicle and the skin around the catheter were anesthetized with 1% lidocaine. A small incision was made below the right clavicle. A subcutaneous tunnel was formed from the incision to the old catheter site. The old catheter was removed over a Amplatz wire.  The wire was advanced into the IVC. A 19 cm tip to cuff Palindrome catheter was pulled through the subcutaneous tunnel. Peel-away sheath was placed over the Amplatz wire. The catheter was placed through the peel-away sheath and positioned at the superior cavoatrial junction. Both lumens aspirated and flushed well. The catheter lumens were flushed with appropriate amount of heparin. The catheter was sutured to skin with Prolene suture. The old jugular right access site was closed using 2 interrupted Ethilon  sutures. Fluoroscopic images were taken and saved for this procedure. FINDINGS: Catheter tip at the superior cavoatrial junction. IMPRESSION: Successful conversion of the right jugular catheter to a tunneled dialysis catheter. Right neck sutures can be removed in 10-14 days. Electronically Signed   By: Markus Daft M.D.   On: 02/26/2018 17:16   Ir Fluoro Guide Cv Line Right  Result Date: 02/18/2018 INDICATION: 62 year old female with renal failure in need of hemodialysis EXAM: IR RIGHT FLOURO GUIDE CV LINE; IR ULTRASOUND GUIDANCE VASC ACCESS RIGHT MEDICATIONS: None ANESTHESIA/SEDATION: None FLUOROSCOPY TIME:  Fluoroscopy Time: 0 minutes 6 seconds (0 mGy). COMPLICATIONS: None immediate. PROCEDURE: Informed written consent was obtained from the patient after a thorough discussion of the procedural risks, benefits and alternatives. All questions were addressed. Maximal Sterile Barrier Technique was utilized including caps, mask, sterile gowns, sterile gloves, sterile drape, hand hygiene and skin antiseptic. A timeout was performed prior to the initiation of the procedure. The right internal jugular vein was interrogated with ultrasound and found to be widely patent. An image was obtained and stored for the medical record. Local anesthesia was attained by infiltration with 1% lidocaine. A small dermatotomy was made. Under real-time sonographic guidance, the vessel was punctured with a 21 gauge micropuncture needle.  Using standard technique, the initial micro needle was exchanged over a 0.018 micro wire for a transitional 4 [place] micro sheath. The micro sheath was then exchanged over a 0.035 wire for a dilator which was used to dilate the soft tissue tract. A 20 cm 3 lumen non tunneled dialysis catheter was then advanced over the wire and position with the tip in the upper right atrium. The catheter flushed and aspirated with ease. The catheter was flushed with heparinized saline and secured to the skin with 0 Prolene suture. The patient tolerated the procedure well. IMPRESSION: Successful placement of a non tunneled Trialysis catheter via the right internal jugular vein. The catheter tip is in the upper right atrium and ready for immediate use. Signed, Criselda Peaches, MD [facility] Electronically Signed   By: Jacqulynn Cadet M.D.   On: 02/18/2018 16:08   Ir US Guide Vasc Access Right  Result Date: 02/18/2018 INDICATION: 62 year old female with renal failure in need of hemodialysis EXAM: IR RIGHT FLOURO GUIDE CV LINE; IR ULTRASOUND GUIDANCE VASC ACCESS RIGHT MEDICATIONS: None ANESTHESIA/SEDATION: None FLUOROSCOPY TIME:  Fluoroscopy Time: 0 minutes 6 seconds (0 mGy). COMPLICATIONS: None immediate. PROCEDURE: Informed written consent was obtained from the patient after a thorough discussion of the procedural risks, benefits and alternatives. All questions were addressed. Maximal Sterile Barrier Technique was utilized including caps, mask, sterile gowns, sterile gloves, sterile drape, hand hygiene and skin antiseptic. A timeout was performed prior to the initiation of the procedure. The right internal jugular vein was interrogated with ultrasound and found to be widely patent. An image was obtained and stored for the medical record. Local anesthesia was attained by infiltration with 1% lidocaine. A small dermatotomy was made. Under real-time sonographic  guidance, the vessel was punctured with a 21 gauge micropuncture needle. Using standard technique, the initial micro needle was exchanged over a 0.018 micro wire for a transitional 4 [place] micro sheath. The micro sheath was then exchanged over a 0.035 wire for a dilator which was used to dilate the soft tissue tract. A 20 cm 3 lumen non tunneled dialysis catheter was then advanced over the wire and position with the tip in the upper right atrium. The catheter  flushed and aspirated with ease. The catheter was flushed with heparinized saline and secured to the skin with 0 Prolene suture. The patient tolerated the procedure well. IMPRESSION: Successful placement of a non tunneled Trialysis catheter via the right internal jugular vein. The catheter tip is in the upper right atrium and ready for immediate use. Signed, Criselda Peaches, [facility] Radiology Electronically Signed   By: Jacqulynn Cadet M.D.   On: 02/18/2018 16:08   Dg Chest Port 1 View  Result Date: 03/06/2018 CLINICAL DATA:  Fever. EXAM: PORTABLE CHEST 1 VIEW COMPARISON:  CT 02/17/2018.  Chest x-ray 02/14/2018. FINDINGS: Right IJ dual-lumen catheter with tip over cavoatrial junction. Cardiomegaly. No focal infiltrate. No pleural effusion or pneumothorax. No acute bony abnormality. J IMPRESSION: 1.  Right IJ dual-lumen catheter with tip over cavoatrial junction. 2. No acute cardiopulmonary disease. No evidence of pulmonary infiltrate. Electronically Signed   By: Marcello Moores  Register   On: 03/06/2018 13:47   Dg Chest Port 1 View  Result Date: 02/12/2018 CLINICAL DATA:  Hypoxia. EXAM: PORTABLE CHEST 1 VIEW COMPARISON:  Chest x-ray dated December 10, 2011. FINDINGS: The heart size and mediastinal contours are within normal limits. Normal pulmonary vascularity. Confluent airspace disease involving the right central lung and left perihilar lung. No pleural effusion or pneumothorax. No acute osseous  abnormality. IMPRESSION: 1. Confluent airspace disease in the right greater than left lungs. Electronically Signed   By: Titus Dubin M.D.   On: 02/12/2018 18:53   US Biopsy (kidney)  Result Date: 02/17/2018 CLINICAL DATA:  Acute kidney injury and microscopic hematuria. Need for renal biopsy. EXAM: ULTRASOUND GUIDED CORE BIOPSY OF LEFT KIDNEY MEDICATIONS: 1.5 mg IV Versed; 25 mcg IV Fentanyl Total Moderate Sedation Time: 17 minutes. [name] level of consciousness and physiologic status were continuously monitored during the procedure by Radiology nursing. The patient was also given 20 mg of IV hydralazine at the beginning of the procedure and prior to needle advancement to treat hypertension. PROCEDURE: The procedure, risks, benefits, and alternatives were explained to the patient. Questions regarding the procedure were encouraged and answered. The patient understands and consents to the procedure. A time out was performed prior to initiating the procedure. Ultrasound was utilized to image both kidneys. The left flank region was prepped with chlorhexidine in a sterile fashion, and a sterile drape was applied covering the operative field. A sterile gown and sterile gloves were used for the procedure. Local anesthesia was provided with 1% Lidocaine. Core biopsy was performed at the level of lower pole cortex of the left kidney with a 16 gauge needle device. Two separate core biopsy samples were obtained and submitted in saline. Additional ultrasound was performed. COMPLICATIONS: None. FINDINGS: Both kidneys were well visualized by ultrasound. The left kidney was chosen for biopsy. Solid tissue was obtained from the level of the lower pole cortex. Ultrasound shows no evidence of immediate bleeding complication. IMPRESSION: Ultrasound-guided core biopsy performed of the left kidney at the level of lower pole cortex. Electronically Signed   By: Aletta Edouard M.D.   On: 02/17/2018 13:07    Time Spent in  minutes  50   Jani Gravel M.D on 03/08/2018 at 2:20 PM  Between 7am to 7pm - Pager - [phone]    After 7pm go to [email or web address] - password [email or web address]  [facility] -  Office  [phone]

## 2018-03-08 NOTE — Progress Notes (Signed)
Patient left unit via bed to go to Dialysis this am at 07:45 accompanied by transport.

## 2018-03-09 LAB — GLUCOSE, CAPILLARY
GLUCOSE-CAPILLARY: 158 mg/dL — AB (ref 70–99)
GLUCOSE-CAPILLARY: 135 mg/dL — AB (ref 70–99)
GLUCOSE-CAPILLARY: 285 mg/dL — AB (ref 70–99)
Glucose-Capillary: 245 mg/dL — ABNORMAL HIGH (ref 70–99)

## 2018-03-09 LAB — BASIC METABOLIC PANEL
Anion gap: 12 (ref 5–15)
BUN: 40 mg/dL — ABNORMAL HIGH (ref 8–23)
CHLORIDE: 98 mmol/L (ref 98–111)
CO2: 26 mmol/L (ref 22–32)
CREATININE: 3.74 mg/dL — AB (ref 0.44–1.00)
Calcium: 7.9 mg/dL — ABNORMAL LOW (ref 8.9–10.3)
GFR calc non Af Amer: 12 mL/min — ABNORMAL LOW (ref >60)
GFR, EST AFRICAN AMERICAN: 14 mL/min — AB (ref >60)
Glucose, Bld: 216 mg/dL — ABNORMAL HIGH (ref 70–99)
POTASSIUM: 3.9 mmol/L (ref 3.5–5.1)
Sodium: 136 mmol/L (ref 135–145)

## 2018-03-09 LAB — CBC
HEMATOCRIT: 23.2 % — AB (ref 36.0–46.0)
HEMOGLOBIN: 7.4 g/dL — AB (ref 12.0–15.0)
MCH: 25.9 pg — ABNORMAL LOW (ref 26.0–34.0)
MCHC: 31.9 g/dL (ref 30.0–36.0)
MCV: 81.1 fL (ref 78.0–100.0)
Platelets: 131 10*3/uL — ABNORMAL LOW (ref 150–400)
RBC: 2.86 MIL/uL — AB (ref 3.87–5.11)
RDW: 21.1 % — ABNORMAL HIGH (ref 11.5–15.5)
WBC: 8.9 10*3/uL (ref 4.0–10.5)

## 2018-03-09 MED ORDER — INSULIN ASPART 100 UNIT/ML ~~LOC~~ SOLN
0.0000 [IU] | Freq: Three times a day (TID) | SUBCUTANEOUS | Status: DC
  Administered 2018-03-09: 2 [IU] via SUBCUTANEOUS
  Administered 2018-03-09: 1 [IU] via SUBCUTANEOUS
  Administered 2018-03-09: 5 [IU] via SUBCUTANEOUS
  Administered 2018-03-10: 3 [IU] via SUBCUTANEOUS
  Administered 2018-03-10: 1 [IU] via SUBCUTANEOUS
  Administered 2018-03-11 (×2): 2 [IU] via SUBCUTANEOUS
  Administered 2018-03-12: 1 [IU] via SUBCUTANEOUS
  Administered 2018-03-12: 3 [IU] via SUBCUTANEOUS
  Administered 2018-03-12: 2 [IU] via SUBCUTANEOUS
  Administered 2018-03-13 (×2): 1 [IU] via SUBCUTANEOUS
  Administered 2018-03-14: 2 [IU] via SUBCUTANEOUS
  Administered 2018-03-15: 3 [IU] via SUBCUTANEOUS

## 2018-03-09 NOTE — Progress Notes (Signed)
[name], female   DOB: [birth year], 62 y.o.   MRN: [id number]                                                                PROGRESS NOTE                                                                                                                                                                                                             Patient Demographics:    Rhonda Lynch, is a 62 y.o. female, DOB - 1955-11-30, GGE:[id number]  Admit date - 02/12/2018   Admitting Physician Vianne Bulls, MD  Outpatient Primary MD for the patient is Patient, No Pcp Per  LOS - 25  Outpatient Specialists:     Chief Complaint  Patient presents with  . Abdominal Pain       Brief Narrative    62 year old female with past medical history of cocaine/alcohol abuse who was admitted on 02/12/2018 with abdominal pain, nausea, vomiting. She was found to have Streptococcus pneumonia and also had acute kidney injury on presentation. Nephrology was consulted . 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 She has progressed to end-stage renal disease and is currently undergoing dialysis, plasmapheresis and has been started, on steroids and cyclophosphamide. Discharge planning after setting of outpatient dialysis facility. Received AV graft on 6/26/2019with hematoma which was subsequently evacuated.     Subjective:    Rhonda Lynch today has not had any events overnite.  Pt has been afebrile.  Pt is deconditioned, HHPT ordered per social work/case management request.   L brachiocephalic fistula 4/65, and then exploration of small hematoma 6/27.  Pt is doing well.  Pt has not yet been assigned a dialysis center.     No headache, No chest pain, No abdominal pain - No Nausea, No new weakness tingling or  numbness, No Cough - SOB.    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 This has improved with decreasing the dose of Lantus to 5 units at bedtime. Patient will likely need this lower dose of Lantus on discharge. Will continue to monitor  Left arm pain status post AV graft 03/05/2018 placementwithsubsequent hematoma status post evacuation on 03/06/2018.  Mild anemia in the context of recent hematoma likely acute blood loss on the background of anemia of chronic kidney disease.. Might need blood transfusion if continues to goal below 7. Could be considered with hemodialysis.  Check CBC in a.m.  Low-grade fever6/27/2019. Since then afebrileBlood cultures drawn 6/27negative Chest x-ray done yesterday did not see any obvious infiltrate.  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 Nephrology on board. Patient is on hemodialysis since 6/12 and received plasmapheresisfor 7 daysas well. on prednisone and cyclophosphamide. Cyclophosphamide started on 02/18/2018. On Bactrim for PCP prophylaxis. Status post tunnel catheter placement by IR. AV graft placement on 03/05/2018 by vascular surgery.Status post hematoma evacuation status post AV graft placement,on 03/06/2018. Awaiting CLIP   Pancytopenia. Trending down leukocytosis platelet count and anemia. Patient is on cyclophosphamide and Bactrim. Nephrology aware. Will need to closely  monitor. Getting Aranesp.If there is worsening pancytopenia, consider hematology evaluation.  Diffuse alveolar hemorrhage versus bronchiolitis obliterans or cryptogenic organizing pneumonia as per the imaging. No active issues at this time. X-ray did not show any obvious findings on 6/27/2019and pulmonary followed the patient during hospitalization. Currently stable. 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   Sepsis secondary to streptococcal pneumonia: Urine antigen was positive for streptococcus. Blood cultures have been negative. Completed the course of antibiotics. 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 Patient will need to follow-up with infectious disease as outpatient.   History of cocaine abuse: UDS was positive for cocaine on admission. Currently stable. No agitation or anxiety noted  Leukocytosis:WBC trending down. On cyclophosphamide. On steroids. Will need to closely monitor for downtrending WBC.      Code Status : FULL CODE  Family Communication  : w patient  Disposition Plan  : home once cleared by nephrology  Barriers For Discharge :  CLIP  Consults  :  nephrology, vascular  Procedures  :  L AVF 6/26, and L AVF exploration 6/27  DVT Prophylaxis  :  Heparin - SCDs   RecentLabs       Lab Results  Component Value Date   PLT 84 (L) 03/08/2018      Antibiotics  :  cephazolin 6/27          Anti-infectives (From admission, onward)   Start     Dose/Rate Route Frequency Ordered Stop   03/06/18 1425  ceFAZolin (ANCEF) 2-4 GM/100ML-% IVPB    Note to Pharmacy:  [facility]   : cabinet override      03/06/18 1425 03/07/18 0229   03/05/18 1415  ceFAZolin (ANCEF) IVPB 2g/100 mL premix     2 g 200 mL/hr over 30 Minutes Intravenous  Once 03/05/18 1401 03/05/18 1445   03/05/18 1403  ceFAZolin (ANCEF) 2-4 GM/100ML-% IVPB    Note to Pharmacy:  [facility]    : cabinet override      03/05/18 1403 03/05/18 1445  02/26/18 1615  ceFAZolin (ANCEF) IVPB 2g/100 mL premix  Status:  Discontinued     2 g 200 mL/hr over 30 Minutes Intravenous  Once 02/26/18 1608 03/04/18 1020   02/26/18 1556  ceFAZolin (ANCEF) 2-4 GM/100ML-% IVPB    Note to Pharmacy:  [facility]   : cabinet override      02/26/18 1556 02/26/18 1630   02/21/18 0900  sulfamethoxazole-trimethoprim (BACTRIM DS,SEPTRA DS) 800-160 MG per tablet 1 tablet     1 tablet Oral Once per day on Mon Wed Fri 02/20/18 1116     02/13/18 1900  cefTRIAXone (ROCEPHIN) 1 g in sodium chloride 0.9 % 100 mL IVPB     1 g 200 mL/hr over 30 Minutes Intravenous Every 24 hours 02/12/18 2122 02/18/18 2014   02/13/18 1900  azithromycin (ZITHROMAX) 500 mg in sodium chloride 0.9 % 250 mL IVPB  Status:  Discontinued     500 mg 250 mL/hr over 60 Minutes Intravenous Every 24 hours 02/12/18 2122 02/14/18 0819   02/12/18 1915  cefTRIAXone (ROCEPHIN) 2 g in sodium chloride 0.9 % 100 mL IVPB  Status:  Discontinued     2 g 200 mL/hr over 30 Minutes Intravenous Every 24 hours 02/12/18 1901 02/12/18 2125   02/12/18 1915  azithromycin (ZITHROMAX) 500 mg in sodium chloride 0.9 % 250 mL IVPB  Status:  Discontinued     500 mg 250 mL/hr over 60 Minutes Intravenous Every 24 hours 02/12/18 1901 02/12/18 2125        Objective:   Vitals:   03/08/18 2012 03/09/18 0014 03/09/18 0216 03/09/18 0728  BP: (!) 182/93  138/75 (!) 164/79  Pulse: 67 70 (!) 59 62  Resp: 18   20  Temp: 99 F (37.2 C)   98.4 F (36.9 C)  TempSrc: Oral   Oral  SpO2: 100% 98%  100%  Weight:      Height:        Wt Readings from Last 3 Encounters:  03/08/18 73.6 kg (162 lb 4.1 oz)     Intake/Output Summary (Last 24 hours) at 03/09/2018 9983 Last data filed at 03/09/2018 0400 Gross per 24 hour  Intake 1080 ml  Output 1493 ml  Net -413 ml     Physical Exam  Awake Alert, Oriented X 3, No new F.N deficits, Normal affect [place].AT,PERRAL Supple  Neck,No JVD, No cervical lymphadenopathy appriciated.  Symmetrical Chest wall movement, Good air movement bilaterally, CTAB RRR,No Gallops,Rubs or new Murmurs, No Parasternal Heave +ve B.Sounds, Abd Soft, No tenderness, No organomegaly appriciated, No rebound - guarding or rigidity. No Cyanosis, Clubbing or edema, No new Rash or bruise   HD catheter right upper chest L avf  , deconditioned    Data Review:    CBC Recent Labs  Lab 03/04/18 0325 03/05/18 0328 03/06/18 0312 03/07/18 0316 03/08/18 0231  WBC 13.9* 10.8* 6.1 6.5 8.7  HGB 7.8* 7.1* 7.5* 7.0* 7.0*  HCT 23.8* 21.8* 22.9* 21.7* 21.3*  PLT 122* 100* 94* 71* 84*  MCV 80.4 80.4 80.6 80.4 80.4  MCH 26.4 26.2 26.4 25.9* 26.4  MCHC 32.8 32.6 32.8 32.3 32.9  RDW 20.9* 21.0* 21.3* 20.7* 20.5*  LYMPHSABS 0.5* 1.1  --   --   --   MONOABS 0.6 0.7  --   --   --   EOSABS 0.0 0.0  --   --   --   BASOSABS 0.0 0.0  --   --   --     Chemistries  Recent Labs  Lab 03/04/18 0325 03/05/18 0328 03/06/18 0312 03/07/18 0316 03/08/18 0231  NA 138 137 140 137 137  K 4.6 3.9 4.7 4.3 4.7  CL 104 102 104 99 99  CO2 24 29 27 29 30   GLUCOSE 213* 96 106* 214* 152*  BUN 85* 47* 61* 36* 61*  CREATININE 6.41* 4.11* 5.21* 3.52* 5.47*  CALCIUM 8.0* 7.5* 7.5* 7.3* 7.8*  AST  --   --  33  --   --   ALT  --   --  18  --   --   ALKPHOS  --   --  37*  --   --   BILITOT  --   --  0.4  --   --    ------------------------------------------------------------------------------------------------------------------ No results for input(s): CHOL, HDL, LDLCALC, TRIG, CHOLHDL, LDLDIRECT in the last 72 hours.  Lab Results  Component Value Date   HGBA1C 6.8 (H) 03/03/2018   ------------------------------------------------------------------------------------------------------------------ No results for input(s): TSH, T4TOTAL, T3FREE, THYROIDAB in the last 72 hours.  Invalid input(s):  FREET3 ------------------------------------------------------------------------------------------------------------------ No results for input(s): VITAMINB12, FOLATE, FERRITIN, TIBC, IRON, RETICCTPCT in the last 72 hours.  Coagulation profile No results for input(s): INR, PROTIME in the last 168 hours.  No results for input(s): DDIMER in the last 72 hours.  Cardiac Enzymes No results for input(s): CKMB, TROPONINI, MYOGLOBIN in the last 168 hours.  Invalid input(s): CK ------------------------------------------------------------------------------------------------------------------    Component Value Date/Time   BNP 225.7 (H) 02/12/2018 1534    Inpatient Medications  Scheduled Meds: . Chlorhexidine Gluconate Cloth  6 each Topical Q0600  . cyclophosphamide  100 mg Oral Daily  . darbepoetin (ARANESP) injection - DIALYSIS  40 mcg Intravenous Q Tue-HD  . famotidine  20 mg Oral Daily  . insulin aspart  0-9 Units Subcutaneous TID WC  . insulin aspart  3 Units Subcutaneous TID WC  . insulin glargine  5 Units Subcutaneous QHS  . multivitamin with minerals  1 tablet Oral Daily  . polyethylene glycol  17 g Oral Daily  . predniSONE  60 mg Oral Q breakfast  . sodium chloride flush  3 mL Intravenous Q12H  . sulfamethoxazole-trimethoprim  1 tablet Oral Once per day on Mon Wed Fri  . thiamine  100 mg Oral Daily   Continuous Infusions: . sodium chloride 10 mL/hr at 03/05/18 1341  . sodium chloride 10 mL/hr at 03/06/18 1429   PRN Meds:.alum & mag hydroxide-simeth, haloperidol lactate, hydrALAZINE, HYDROcodone-acetaminophen, HYDROmorphone (DILAUDID) injection, LORazepam, ondansetron **OR** ondansetron (ZOFRAN) IV, senna-docusate, zolpidem  Micro Results Recent Results (from the past 240 hour(s))  Surgical pcr screen     Status: None   Collection Time: 03/04/18  4:52 AM  Result Value Ref Range Status   MRSA, PCR NEGATIVE NEGATIVE Final   Staphylococcus aureus NEGATIVE NEGATIVE Final     Comment: (NOTE) The Xpert SA Assay (FDA approved for NASAL specimens in patients 68 years of age and older), is one component of a comprehensive surveillance program. It is not intended to diagnose infection nor to guide or monitor treatment. Performed at [facility], [place] [address] [place], [place] [address]   Culture, blood (routine x 2)     Status: None (Preliminary result)   Collection Time: 03/06/18  6:30 PM  Result Value Ref Range Status   Specimen Description BLOOD RIGHT ARM  Final   Special Requests   Final    BOTTLES DRAWN AEROBIC AND ANAEROBIC Blood Culture adequate volume   Culture  Final    NO GROWTH 2 DAYS Performed at [facility], [place] [address], [place], [place] [address]    Report Status PENDING  Incomplete  Culture, blood (routine x 2)     Status: None (Preliminary result)   Collection Time: 03/06/18  6:37 PM  Result Value Ref Range Status   Specimen Description BLOOD RIGHT ARM  Final   Special Requests   Final    BOTTLES DRAWN AEROBIC AND ANAEROBIC Blood Culture adequate volume   Culture   Final    NO GROWTH 2 DAYS Performed at [facility], [place] [address], [place], [place] [address]    Report Status PENDING  Incomplete    Radiology Reports Ct Abdomen Pelvis Wo Contrast  Result Date: 02/12/2018 CLINICAL DATA:  Epigastric abdominal pain for the past week. EXAM: CT ABDOMEN AND PELVIS WITHOUT CONTRAST TECHNIQUE: Multidetector CT imaging of the abdomen and pelvis was performed following the standard protocol without IV contrast. COMPARISON:  CT abdomen pelvis dated February 19, 2010. FINDINGS: Lower chest: Confluent ground-glass density at the right greater than left lung bases with inter and intralobular septal thickening, consistent with crazy paving pattern. This spares the subpleural lung. Hepatobiliary: No focal liver abnormality is seen. Status post cholecystectomy. Mild central intrahepatic and common bile duct dilatation, likely related to  post cholecystectomy state. Pancreas: Unremarkable. No pancreatic ductal dilatation or surrounding inflammatory changes. Spleen: Normal in size without focal abnormality. Adrenals/Urinary Tract: Adrenal glands are unremarkable. Kidneys are normal, without renal calculi, focal lesion, or hydronephrosis. Bladder is unremarkable. Stomach/Bowel: Stomach is within normal limits. Appendix appears normal. No evidence of bowel wall thickening, distention, or inflammatory changes. Vascular/Lymphatic: Aortic atherosclerosis. No enlarged abdominal or pelvic lymph nodes. Reproductive: Prior hysterectomy. 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 Other: Tiny fat containing umbilical hernia. No free fluid or pneumoperitoneum. Musculoskeletal: No acute or significant osseous findings. 3 mm anterolisthesis at L4-L5 due to severe facet arthropathy. Which IMPRESSION: 1.  No acute intra-abdominal process. 2. Confluent ground-glass density and inter and intralobular septal thickening at the right greater than left lung bases, consistent with crazy paving pattern. While this pattern is nonspecific, given the sparing of the subpleural lung, this may be related to cryptogenic organizing pneumonia. Pulmonology consultation is recommended. 3. 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 Electronically Signed   By: Titus Dubin M.D.   On: 02/12/2018 20:01   Dg Chest 1 View  Result Date: 02/14/2018 CLINICAL DATA:  Shortness of breath EXAM: CHEST  1 VIEW COMPARISON:  02/12/2018 FINDINGS: Severe diffuse bilateral disease throughout the lungs, stable. Cardiomegaly. No visible effusions or acute bony abnormality. IMPRESSION: Severe bilateral airspace disease could reflect edema/CHF or infection. No change. Electronically Signed   By: Rolm Baptise M.D.   On: 02/14/2018 09:38   US Renal  Result Date: 02/14/2018 CLINICAL DATA:  Acute kidney injury. EXAM: RENAL / URINARY TRACT ULTRASOUND COMPLETE COMPARISON:  CT abdomen pelvis dated February 12, 2018. FINDINGS: Right Kidney: Length: 11.0 cm. Increased echogenicity. No mass or hydronephrosis visualized. Left Kidney: Length: 10.9 cm. Increased echogenicity. No mass or hydronephrosis visualized. Bladder: Decompressed by a Foley catheter. IMPRESSION: Increased renal cortical echogenicity bilaterally, consistent with medical renal disease. Electronically Signed   By: Titus Dubin M.D.   On: 02/14/2018 22:34   Ct Chest High Resolution  Result Date: 02/18/2018 CLINICAL DATA:  62 year old female with history of alcohol and cocaine abuse presenting with dyspnea and productive cough. Abnormal chest x-ray. EXAM:  CT CHEST WITHOUT CONTRAST TECHNIQUE: Multidetector CT imaging of the chest was performed following the standard protocol without intravenous contrast. High resolution imaging of the lungs, as well as inspiratory and expiratory imaging, was performed. COMPARISON:  No prior chest CT.  Chest x-ray 02/14/2018. FINDINGS: Cardiovascular: Heart size is mildly enlarged. There is no significant pericardial fluid, thickening or pericardial calcification. Aortic atherosclerosis. No definite coronary artery calcifications. Mediastinum/Nodes: No pathologically enlarged mediastinal or hilar lymph nodes. Please note that accurate exclusion of hilar adenopathy is limited on noncontrast CT scans. Esophagus is unremarkable in appearance. No axillary lymphadenopathy. Lungs/Pleura: High-resolution images demonstrate widespread areas of ground-glass attenuation and septal thickening, as well as widespread areas of peripheral bronchiolectasis. Subpleural sparing is noted, particularly throughout the mid to lower lungs. These findings have no definitive craniocaudal gradient, but appear slightly more pronounced throughout the mid to upper lungs than the lower lungs. No honeycombing. Inspiratory and expiratory imaging is unremarkable. Linear area of scarring or atelectasis in the periphery of the left lower lobe. Trace left  pleural effusion lying dependently. Upper Abdomen: Unremarkable. Musculoskeletal: There are no aggressive appearing lytic or blastic lesions noted in the visualized portions of the skeleton. IMPRESSION: 1. The appearance of the lungs is nonspecific, but there are imaging findings that are suggestive of cryptogenic organizing pneumonia (COP). Other differential considerations include both cardiogenic and noncardiogenic edema, however, this is not strongly favored. Evaluation by Pulmonology is strongly recommended. 2. Aortic atherosclerosis. Aortic Atherosclerosis (ICD10-I70.0). Electronically Signed   By: Vinnie Langton M.D.   On: 02/18/2018 08:37   Ir Fluoro Guide Cv Line Right  Result Date: 02/26/2018 INDICATION: 74 year old with acute kidney injury and on dialysis. Patient currently has a non tunneled dialysis catheter and needs conversion to a tunneled dialysis catheter. EXAM: CONVERSION OF NON TUNNELED DIALYSIS CATHETER TO A TUNNELED DIALYSIS CATHETER WITH FLUOROSCOPY Physician: Stephan Minister. Anselm Pancoast, MD MEDICATIONS: Ancef 2 g; The antibiotic was administered within an appropriate time interval prior to skin puncture. ANESTHESIA/SEDATION: Versed 1.5 mg IV; Fentanyl 100 mcg IV; Moderate Sedation Time:  20 minutes The patient was continuously monitored during the procedure by the interventional radiology nurse under my direct supervision. FLUOROSCOPY TIME:  Fluoroscopy Time: 18 seconds, 1 mGy COMPLICATIONS: None immediate. PROCEDURE: The existing right jugular dialysis catheter was evaluated. This catheter was felt to be adequate for conversion to a tunneled catheter. The procedure was explained to the patient. The risks and benefits of the procedure were discussed and the patient's questions were addressed. Informed consent was obtained from the patient. The existing catheter and the right upper chest were prepped and draped in a sterile fashion. Maximal barrier sterile technique was utilized including caps, mask,  sterile gowns, sterile gloves, sterile drape, hand hygiene and skin antiseptic. The skin below the right clavicle and the skin around the catheter were anesthetized with 1% lidocaine. A small incision was made below the right clavicle. A subcutaneous tunnel was formed from the incision to the old catheter site. The old catheter was removed over a Amplatz wire. The wire was advanced into the IVC. A 19 cm tip to cuff Palindrome catheter was pulled through the subcutaneous tunnel. Peel-away sheath was placed over the Amplatz wire. The catheter was placed through the peel-away sheath and positioned at the superior cavoatrial junction. Both lumens aspirated and flushed well. The catheter lumens were flushed with appropriate amount of heparin. The catheter was sutured to skin with Prolene suture. The old jugular right access site was closed using 2 interrupted Ethilon  sutures. Fluoroscopic images were taken and saved for this procedure. FINDINGS: Catheter tip at the superior cavoatrial junction. IMPRESSION: Successful conversion of the right jugular catheter to a tunneled dialysis catheter. Right neck sutures can be removed in 10-14 days. Electronically Signed   By: Markus Daft M.D.   On: 02/26/2018 17:16   Ir Fluoro Guide Cv Line Right  Result Date: 02/18/2018 INDICATION: 62 year old female with renal failure in need of hemodialysis EXAM: IR RIGHT FLOURO GUIDE CV LINE; IR ULTRASOUND GUIDANCE VASC ACCESS RIGHT MEDICATIONS: None ANESTHESIA/SEDATION: None FLUOROSCOPY TIME:  Fluoroscopy Time: 0 minutes 6 seconds (0 mGy). COMPLICATIONS: None immediate. PROCEDURE: Informed written consent was obtained from the patient after a thorough discussion of the procedural risks, benefits and alternatives. All questions were addressed. Maximal Sterile Barrier Technique was utilized including caps, mask, sterile gowns, sterile gloves, sterile drape, hand hygiene and skin antiseptic. A timeout was performed prior to the initiation of  the procedure. The right internal jugular vein was interrogated with ultrasound and found to be widely patent. An image was obtained and stored for the medical record. Local anesthesia was attained by infiltration with 1% lidocaine. A small dermatotomy was made. Under real-time sonographic guidance, the vessel was punctured with a 21 gauge micropuncture needle. Using standard technique, the initial micro needle was exchanged over a 0.018 micro wire for a transitional 4 [place] micro sheath. The micro sheath was then exchanged over a 0.035 wire for a dilator which was used to dilate the soft tissue tract. A 20 cm 3 lumen non tunneled dialysis catheter was then advanced over the wire and position with the tip in the upper right atrium. The catheter flushed and aspirated with ease. The catheter was flushed with heparinized saline and secured to the skin with 0 Prolene suture. The patient tolerated the procedure well. IMPRESSION: Successful placement of a non tunneled Trialysis catheter via the right internal jugular vein. The catheter tip is in the upper right atrium and ready for immediate use. Signed, Criselda Peaches, MD [facility] Radiology Electronically Signed   By: Jacqulynn Cadet M.D.   On: 02/18/2018 16:08   Ir US Guide Vasc Access Right  Result Date: 02/18/2018 INDICATION: 62 year old female with renal failure in need of hemodialysis EXAM: IR RIGHT FLOURO GUIDE CV LINE; IR ULTRASOUND GUIDANCE VASC ACCESS RIGHT MEDICATIONS: None ANESTHESIA/SEDATION: None FLUOROSCOPY TIME:  Fluoroscopy Time: 0 minutes 6 seconds (0 mGy). COMPLICATIONS: None immediate. PROCEDURE: Informed written consent was obtained from the patient after a thorough discussion of the procedural risks, benefits and alternatives. All questions were addressed. Maximal Sterile Barrier Technique was utilized including caps, mask, sterile gowns, sterile gloves, sterile drape, hand hygiene and  skin antiseptic. A timeout was performed prior to the initiation of the procedure. The right internal jugular vein was interrogated with ultrasound and found to be widely patent. An image was obtained and stored for the medical record. Local anesthesia was attained by infiltration with 1% lidocaine. A small dermatotomy was made. Under real-time sonographic guidance, the vessel was punctured with a 21 gauge micropuncture needle. Using standard technique, the initial micro needle was exchanged over a 0.018 micro wire for a transitional 4 [place] micro sheath. The micro sheath was then exchanged over a 0.035 wire for a dilator which was used to dilate the soft tissue tract. A 20 cm 3 lumen non tunneled dialysis catheter was then advanced over the wire and position with the tip in the upper right atrium. The catheter  flushed and aspirated with ease. The catheter was flushed with heparinized saline and secured to the skin with 0 Prolene suture. The patient tolerated the procedure well. IMPRESSION: Successful placement of a non tunneled Trialysis catheter via the right internal jugular vein. The catheter tip is in the upper right atrium and ready for immediate use. Signed, Criselda Peaches, MD [facility] Radiology Electronically Signed   By: Jacqulynn Cadet M.D.   On: 02/18/2018 16:08   Dg Chest Port 1 View  Result Date: 03/06/2018 CLINICAL DATA:  Fever. EXAM: PORTABLE CHEST 1 VIEW COMPARISON:  CT 02/17/2018.  Chest x-ray 02/14/2018. FINDINGS: Right IJ dual-lumen catheter with tip over cavoatrial junction. Cardiomegaly. No focal infiltrate. No pleural effusion or pneumothorax. No acute bony abnormality. J IMPRESSION: 1.  Right IJ dual-lumen catheter with tip over cavoatrial junction. 2. No acute cardiopulmonary disease. No evidence of pulmonary infiltrate. Electronically Signed   By: Marcello Moores  Register   On: 03/06/2018 13:47   Dg Chest Port 1 View  Result Date:  02/12/2018 CLINICAL DATA:  Hypoxia. EXAM: PORTABLE CHEST 1 VIEW COMPARISON:  Chest x-ray dated December 10, 2011. FINDINGS: The heart size and mediastinal contours are within normal limits. Normal pulmonary vascularity. Confluent airspace disease involving the right central lung and left perihilar lung. No pleural effusion or pneumothorax. No acute osseous abnormality. IMPRESSION: 1. Confluent airspace disease in the right greater than left lungs. Electronically Signed   By: Titus Dubin M.D.   On: 02/12/2018 18:53   US Biopsy (kidney)  Result Date: 02/17/2018 CLINICAL DATA:  Acute kidney injury and microscopic hematuria. Need for renal biopsy. EXAM: ULTRASOUND GUIDED CORE BIOPSY OF LEFT KIDNEY MEDICATIONS: 1.5 mg IV Versed; 25 mcg IV Fentanyl Total Moderate Sedation Time: 17 minutes. [name] level of consciousness and physiologic status were continuously monitored during the procedure by Radiology nursing. The patient was also given 20 mg of IV hydralazine at the beginning of the procedure and prior to needle advancement to treat hypertension. PROCEDURE: The procedure, risks, benefits, and alternatives were explained to the patient. Questions regarding the procedure were encouraged and answered. The patient understands and consents to the procedure. A time out was performed prior to initiating the procedure. Ultrasound was utilized to image both kidneys. The left flank region was prepped with chlorhexidine in a sterile fashion, and a sterile drape was applied covering the operative field. A sterile gown and sterile gloves were used for the procedure. Local anesthesia was provided with 1% Lidocaine. Core biopsy was performed at the level of lower pole cortex of the left kidney with a 16 gauge needle device. Two separate core biopsy samples were obtained and submitted in saline. Additional ultrasound was performed. COMPLICATIONS: None. FINDINGS: Both kidneys were well visualized by ultrasound. The left kidney  was chosen for biopsy. Solid tissue was obtained from the level of the lower pole cortex. Ultrasound shows no evidence of immediate bleeding complication. IMPRESSION: Ultrasound-guided core biopsy performed of the left kidney at the level of lower pole cortex. Electronically Signed   By: Aletta Edouard M.D.   On: 02/17/2018 13:07    Time Spent in minutes  30   Jani Gravel M.D on 03/09/2018 at 8:21 AM  Between 7am to 7pm - Pager - [phone]    After 7pm go to [email or web address] - password [facility]  [facility] -  Office  [phone]

## 2018-03-09 NOTE — Plan of Care (Signed)
Discussed with pt ways to ease anxiety such as deep breathing, pt given lyrica before bed. Pt progressing

## 2018-03-09 NOTE — Plan of Care (Signed)
Discussed with patient plan of care for the evening, pain management and sleep medication with some teach back displayed

## 2018-03-09 NOTE — Progress Notes (Signed)
[facility] PROGRESS NOTE  Assessment/ Plan: Pt is a 62 y.o. yo female with history of cocaine and alcohol abuse admitted with abdominal pain, nausea vomiting due to septic secondary to Streptococcus pneumonia.  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  Seen by pulmonologist. -Started hemodialysis on 6/12. TDC was placed on 6/19.  AV fistula was created on 6/26.  No sign of renal recovery yet. S/p dialysis yesterday, tolerated well.  Next dialysis on Tuesday.  CLIP in progress.   -received plasma exchange for 7 days.   -received Solu-Medrol pulse from 6/11 to 6/13, now on prednisone 60 mill grams daily with plan to slow taper.  -Started cyclophosphamide 100 mg daily since 02/18/2018.  Monitor for leukopenia.  WBC count is trending down, wbc 8.9, plt 131.  -On Bactrim for PCP prophylaxis -The biopsy showed 70% of the glomeruli affected although mild IFTA, unknown how much renal recovery patient will have. Watch for renal recovery.   #Left upper extremity pain and swelling status post AV fistula creation: taken to OR on 6/27 the exploration and evacuation of small left arm hematoma.  The swelling and pain is better today.  # Anemia: iron sat 82 %, starting aranesp weekly.  #Polysubstance abuse: Urine positive for cocaine.  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  # HCV Ab positive: Recommended to follow-up outpatient for possible treatment.  #Dispo: waiting for outpatient dialysis arrangement.  Subjective: Seen and examined at bedside.  Left arm pain and swelling is better.  No nausea vomiting.  Patient was anxious today. Objective Vital signs in last 24 hours: Vitals:    03/08/18 2012 03/09/18 0014 03/09/18 0216 03/09/18 0728  BP: (!) 182/93  138/75 (!) 164/79  Pulse: 67 70 (!) 59 62  Resp: 18   20  Temp: 99 F (37.2 C)   98.4 F (36.9 C)  TempSrc: Oral   Oral  SpO2: 100% 98%  100%  Weight:      Height:       Weight change:   Intake/Output Summary (Last 24 hours) at 03/09/2018 1139 Last data filed at 03/09/2018 0400 Gross per 24 hour  Intake 1080 ml  Output 1493 ml  Net -413 ml       Labs: Basic Metabolic Panel: Recent Labs  Lab 03/03/18 0427  03/07/18 0316 03/08/18 0231 03/09/18 0843  NA 137   < > 137 137 136  K 4.1   < > 4.3 4.7 3.9  CL 103   < > 99 99 98  CO2 26   < > 29 30 26   GLUCOSE 201*   < > 214* 152* 216*  BUN 63*   < > 36* 61* 40*  CREATININE 5.22*   < > 3.52* 5.47* 3.74*  CALCIUM 8.0*   < > 7.3* 7.8* 7.9*  PHOS 3.6  --   --   --   --    < > = values in this interval not displayed.   Liver Function Tests: Recent Labs  Lab 03/03/18 0427 03/06/18 0312  AST  --  33  ALT  --  18  ALKPHOS  --  37*  BILITOT  --  0.4  PROT  --  4.1*  ALBUMIN 3.2* 3.2*  No results for input(s): LIPASE, AMYLASE in the last 168 hours. No results for input(s): AMMONIA in the last 168 hours. CBC: Recent Labs  Lab 03/04/18 0325 03/05/18 0328 03/06/18 0312 03/07/18 0316 03/08/18 0231 03/09/18 0843  WBC 13.9* 10.8* 6.1 6.5 8.7 8.9  NEUTROABS 12.6* 8.9*  --   --   --   --   HGB 7.8* 7.1* 7.5* 7.0* 7.0* 7.4*  HCT 23.8* 21.8* 22.9* 21.7* 21.3* 23.2*  MCV 80.4 80.4 80.6 80.4 80.4 81.1  PLT 122* 100* 94* 71* 84* 131*   Cardiac Enzymes: No results for input(s): CKTOTAL, CKMB, CKMBINDEX, TROPONINI in the last 168 hours. CBG: Recent Labs  Lab 03/08/18 1302 03/08/18 1708 03/08/18 2006 03/09/18 0726 03/09/18 1136  GLUCAP 162* 181* 142* 158* 135*    Iron Studies:  No results for input(s): IRON, TIBC, TRANSFERRIN, FERRITIN in the last 72 hours. Studies/Results: No results found.  Medications: Infusions: . sodium chloride  10 mL/hr at 03/05/18 1341  . sodium chloride 10 mL/hr at 03/06/18 1429    Scheduled Medications: . Chlorhexidine Gluconate Cloth  6 each Topical Q0600  . cyclophosphamide  100 mg Oral Daily  . darbepoetin (ARANESP) injection - DIALYSIS  40 mcg Intravenous Q Tue-HD  . famotidine  20 mg Oral Daily  . insulin aspart  0-9 Units Subcutaneous TID WC  . insulin aspart  3 Units Subcutaneous TID WC  . insulin glargine  5 Units Subcutaneous QHS  . multivitamin with minerals  1 tablet Oral Daily  . polyethylene glycol  17 g Oral Daily  . predniSONE  60 mg Oral Q breakfast  . sodium chloride flush  3 mL Intravenous Q12H  . sulfamethoxazole-trimethoprim  1 tablet Oral Once per day on Mon Wed Fri  . thiamine  100 mg Oral Daily    have reviewed scheduled and prn medications.  Physical Exam: General: Lying in bed comfortable, not in distress Heart: Regular rate rhythm S1-S2 normal l Lungs: Clear bilateral, no wheezing Abdomen: Soft, nontender, nondistended Extremities: No edema Dialysis Access: Right IJ TDC catheter.  Upper extremity AV fistula site has good thrill and bruit, swelling has subsided.  Rhonda Lynch Rhonda Lynch 03/09/2018,11:39 AM  LOS: 25 days

## 2018-03-10 LAB — RENAL FUNCTION PANEL
Albumin: 3.2 g/dL — ABNORMAL LOW (ref 3.5–5.0)
Anion gap: 11 (ref 5–15)
BUN: 64 mg/dL — ABNORMAL HIGH (ref 8–23)
CO2: 25 mmol/L (ref 22–32)
Calcium: 7.9 mg/dL — ABNORMAL LOW (ref 8.9–10.3)
Chloride: 99 mmol/L (ref 98–111)
Creatinine, Ser: 5.3 mg/dL — ABNORMAL HIGH (ref 0.44–1.00)
GFR calc Af Amer: 9 mL/min — ABNORMAL LOW (ref >60)
GFR calc non Af Amer: 8 mL/min — ABNORMAL LOW (ref >60)
Glucose, Bld: 225 mg/dL — ABNORMAL HIGH (ref 70–99)
Phosphorus: 3.1 mg/dL (ref 2.5–4.6)
Potassium: 4.7 mmol/L (ref 3.5–5.1)
Sodium: 135 mmol/L (ref 135–145)

## 2018-03-10 LAB — CBC
HCT: 22.1 % — ABNORMAL LOW (ref 36.0–46.0)
Hemoglobin: 7.1 g/dL — ABNORMAL LOW (ref 12.0–15.0)
MCH: 26.4 pg (ref 26.0–34.0)
MCHC: 32.1 g/dL (ref 30.0–36.0)
MCV: 82.2 fL (ref 78.0–100.0)
Platelets: 136 10*3/uL — ABNORMAL LOW (ref 150–400)
RBC: 2.69 MIL/uL — ABNORMAL LOW (ref 3.87–5.11)
RDW: 20.9 % — ABNORMAL HIGH (ref 11.5–15.5)
WBC: 7.5 10*3/uL (ref 4.0–10.5)

## 2018-03-10 LAB — GLUCOSE, CAPILLARY
GLUCOSE-CAPILLARY: 234 mg/dL — AB (ref 70–99)
Glucose-Capillary: 84 mg/dL (ref 70–99)
Glucose-Capillary: 129 mg/dL — ABNORMAL HIGH (ref 70–99)
Glucose-Capillary: 233 mg/dL — ABNORMAL HIGH (ref 70–99)

## 2018-03-10 MED ORDER — HEPARIN SODIUM (PORCINE) 1000 UNIT/ML DIALYSIS
20.0000 [IU]/kg | INTRAMUSCULAR | Status: DC | PRN
  Filled 2018-03-10: qty 2

## 2018-03-10 NOTE — Progress Notes (Signed)
[facility] [place]  Subjective  - Doing well with less pain and edema in the left UE.   Objective 135/72 79 97.6 F (36.4 C) (Oral) 17 97%  Intake/Output Summary (Last 24 hours) at 03/10/2018 0908 Last data filed at 03/10/2018 0600 Gross per 24 hour  Intake 250 ml  Output -  Net 250 ml    Left UE with palpable radial pulse, grip 5/5, sensation intact and palpable thrill in fistula.  Compressible/ softer hematoma area surrounding incision left AC area.    Assessment/Planning: 62 y.o. female is s/p L brachiocephalic fistula with subsequent evac of hematoma   Resolving hematoma with patent fistula. F/U 4-6 weeks in our office.     Roxy Horseman 03/10/2018 9:08 AM --  Laboratory Lab Results: Recent Labs    03/08/18 0231 03/09/18 0843  WBC 8.7 8.9  HGB 7.0* 7.4*  HCT 21.3* 23.2*  PLT 84* 131*   BMET Recent Labs    03/08/18 0231 03/09/18 0843  NA 137 136  K 4.7 3.9  CL 99 98  CO2 30 26  GLUCOSE 152* 216*  BUN 61* 40*  CREATININE 5.47* 3.74*  CALCIUM 7.8* 7.9*    COAG Lab Results  Component Value Date   INR 1.20 02/25/2018   INR 1.07 02/12/2018   No results found for: PTT

## 2018-03-10 NOTE — Plan of Care (Signed)
  Problem: Coping: Goal: Level of anxiety will decrease Outcome: Not Progressing  Pt woke up around 0110 stating she had a nightmare. Pt states she keeps having dreams about something going wrong with her neck. Pt currently has a temporary HD cath in her RIJ. RN explained that this catheter is not permanent, and is only needed until her AV fistula matures and she is able to use her fistula for HD. Pt reported feeling short of breath and anxious. RN placed pt on 2L [place] for comfort and given 0.5 lorazepam for anxiety.

## 2018-03-10 NOTE — Progress Notes (Signed)
  [facility] Progress Note    Subjective:   No new complaints   Objective:   BP 135/72 (BP Location: Right Arm)   Pulse 79   Temp 97.6 F (36.4 C) (Oral)   Resp 17   Ht 5\' 1"  (1.549 m)   Wt 73.6 kg (162 lb 4.1 oz)   SpO2 97%   BMI 30.66 kg/m   Intake/Output: I/O last 3 completed shifts: In: 1330 [P.O.:340; I.V.:990] Out: -    Intake/Output this shift:  No intake/output data recorded. Weight change:   Physical Exam: Gen: NAD CVS: no rub Resp: cta Abd: benign Ext: no edema, LUE AVF +T/B with some bruising, no warmth or tenderness  Labs: BMET Recent Labs  Lab 03/03/18 1735 03/04/18 0325 03/05/18 0328 03/06/18 0312 03/07/18 0316 03/08/18 0231 03/09/18 0843  NA 136 138 137 140 137 137 136  K 4.6 4.6 3.9 4.7 4.3 4.7 3.9  CL 99 104 102 104 99 99 98  CO2  --  24 29 27 29 30 26   GLUCOSE 210* 213* 96 106* 214* 152* 216*  BUN 69* 85* 47* 61* 36* 61* 40*  CREATININE 6.30* 6.41* 4.11* 5.21* 3.52* 5.47* 3.74*  ALBUMIN  --   --   --  3.2*  --   --   --   CALCIUM  --  8.0* 7.5* 7.5* 7.3* 7.8* 7.9*   CBC Recent Labs  Lab 03/04/18 0325 03/05/18 0328 03/06/18 0312 03/07/18 0316 03/08/18 0231 03/09/18 0843  WBC 13.9* 10.8* 6.1 6.5 8.7 8.9  NEUTROABS 12.6* 8.9*  --   --   --   --   HGB 7.8* 7.1* 7.5* 7.0* 7.0* 7.4*  HCT 23.8* 21.8* 22.9* 21.7* 21.3* 23.2*  MCV 80.4 80.4 80.6 80.4 80.4 81.1  PLT 122* 100* 94* 71* 84* 131*    @IMGRELPRIORS @ Medications:    . Chlorhexidine Gluconate Cloth  6 each Topical Q0600  . cyclophosphamide  100 mg Oral Daily  . darbepoetin (ARANESP) injection - DIALYSIS  40 mcg Intravenous Q Tue-HD  . famotidine  20 mg Oral Daily  . insulin aspart  0-9 Units Subcutaneous TID WC  . insulin aspart  3 Units Subcutaneous TID WC  . insulin glargine  5 Units Subcutaneous QHS  . multivitamin with minerals  1 tablet Oral Daily  . polyethylene glycol  17 g Oral Daily  . predniSONE  60 mg Oral Q breakfast  . sodium chloride  flush  3 mL Intravenous Q12H  . sulfamethoxazole-trimethoprim  1 tablet Oral Once per day on Mon Wed Fri  . thiamine  100 mg Oral Daily     Assessment/ Plan:   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  Will cont with cytoxan 100mg  daily and follow WBC at outpatient HD unit and bactrim PCP prophylaxis and cont with prednisone taper.  Pt to follow up with Dr. Carolin Sicks as an outpatient as well or can be managed at her HD unit once established 2. ESRD awaiting outpatient HD arrangements. 3. 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 Nutrition: renal diet 6. Hypertension: stable 7. Vascular access- s/p LUE AVF placed 03/05/18 by Dr. Donnetta Hutching s/p hematoma evacuation on 03/06/18 by Dr. Scot Dock. 8. Disposition- awaiting outpatient HD unit to be arranged.  Donetta Potts, MD [place] Pager [phone] 03/10/2018, 1:52 PM

## 2018-03-10 NOTE — Progress Notes (Signed)
[name], female   DOB: 1956-03-30, 62 y.o.   MRN: [id number]                                                                PROGRESS NOTE                                                                                                                                                                                                             Patient Demographics:    Rhonda Lynch, is a 63 y.o. female, DOB - 1955/10/16, LFY:[id number]  Admit date - 02/12/2018   Admitting Physician Vianne Bulls, MD  Outpatient Primary MD for the patient is Patient, No Pcp Per  LOS - 26  Outpatient Specialists:     Chief Complaint  Patient presents with  . Abdominal Pain       Brief Narrative   62 year old female with past medical history of cocaine/alcohol abuse who was admitted on 02/12/2018 with abdominal pain, nausea, vomiting. She was found to have Streptococcus pneumonia and also had acute kidney injury on presentation. Nephrology was consulted . 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 She has progressed to end-stage renal disease and is currently undergoing dialysis, plasmapheresis and has been started, on steroids and cyclophosphamide. Discharge planning after setting of outpatient dialysis facility. Received AV graft on 6/26/2019with hematoma which was subsequently evacuated.      Subjective:    Rhonda Lynch today has not had any events overnite, afebrile.  Pt has been abulating some.     L brachiocephalic fistula 8/52, and then exploration of small hematoma 6/27. Pt is doing well. Pt has not yet been assigned a dialysis center.       No headache, No chest pain, No abdominal pain - No Nausea, No new weakness tingling or numbness, No Cough - SOB.    Assessment  & Plan :    Principal Problem:   PNEUMONIA Active Problems:   ELEVATED BLOOD PRESSURE WITHOUT DIAGNOSIS OF HYPERTENSION   Renal failure   Acute respiratory failure with hypoxia (HCC)   Alcohol abuse   Anemia   Cocaine abuse (HCC)   Epigastric pain   Pneumonia  Pulmonary infiltrate   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[place])   Diffuse pulmonary alveolar hemorrhage   Hypoxemia   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CC)   Acute kidney injury ([place])   Fever     Hypoglycemiawith a history of diabetes mellitus. This has improved with decreasing the dose of Lantus to 5 units at bedtime. Patient will likely need this lower dose of Lantus on discharge. Will continue to monitor  Left arm pain status post AV graft6/26/2019placementwithsubsequent hematoma status post evacuation on 03/06/2018.  Mild anemia in the context of recent hematoma likely acute blood loss on the background of anemia of chronic kidney disease.. Might need blood transfusion if continues to goal below 7. Could be considered with hemodialysis.  Check CBC in a.m.  Low-grade fever6/27/2019.Since then afebrileBlood cultures drawn6/27negative Chest x-ray done yesterday did not see any obvious infiltrate.  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 Nephrology on board. Patient is on hemodialysis since 6/12 and received plasmapheresisfor 7 daysas well. on prednisone and cyclophosphamide. Cyclophosphamide started on 02/18/2018. On Bactrim for PCP prophylaxis. Status post tunnel catheter placement by IR. AV graft placement on 03/05/2018 by vascular surgery.Status post hematoma evacuation status post AV graft placement,on 03/06/2018. Awaiting CLIP   Pancytopenia. Trending down leukocytosis platelet count and anemia. Patient is on cyclophosphamide and Bactrim. Nephrology aware. Will need to closely monitor. Getting Aranesp.If there is worsening pancytopenia,  consider hematology evaluation.  Diffuse alveolar hemorrhage versus bronchiolitis obliterans or cryptogenic organizing pneumonia as per the imaging. No active issues at this time. X-ray did not show any obvious findings on 6/27/2019and pulmonary followed the patient during hospitalization. Currently stable. 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   Sepsis secondary to streptococcal pneumonia: Urine antigen was positive for streptococcus. Blood cultures have been negative. Completed the course of antibiotics. 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 Patient will need to follow-up with infectious disease as outpatient.   History of cocaine abuse: UDS was positive for cocaine on admission. Currently stable. No agitation or anxiety noted  Leukocytosis:WBC trending down. On cyclophosphamide. On steroids. Will need to closely monitor for downtrending WBC.      Code Status:FULL CODE  Family Communication :w patient  Disposition Plan:home once cleared by nephrology  Barriers For Discharge: CLIP  Consults :nephrology, vascular  Procedures : L AVF 6/26, and L AVF exploration 6/27  DVT Prophylaxis: Heparin - SCDs      Lab Results  Component Value Date   PLT 131 (L) 03/09/2018    Antibiotics  :  Cefazolin 6/27  Anti-infectives (From admission, onward)   Start     Dose/Rate Route Frequency Ordered Stop   03/06/18 1425  ceFAZolin (ANCEF) 2-4 GM/100ML-% IVPB    Note to Pharmacy:  [facility]   : cabinet override      03/06/18 1425 03/07/18 0229   03/05/18 1415  ceFAZolin (ANCEF) IVPB 2g/100 mL premix     2 g 200 mL/hr over 30 Minutes Intravenous  Once 03/05/18 1401 03/05/18 1445   03/05/18 1403  ceFAZolin (ANCEF) 2-4 GM/100ML-% IVPB    Note to Pharmacy:  [facility]   : cabinet override      03/05/18 1403 03/05/18 1445   02/26/18 1615  ceFAZolin (ANCEF) IVPB  2g/100 mL premix  Status:  Discontinued     2 g 200 mL/hr over 30 Minutes Intravenous  Once 02/26/18 1608 03/04/18 1020   02/26/18 1556  ceFAZolin (ANCEF) 2-4  GM/100ML-% IVPB    Note to Pharmacy:  [facility]   : cabinet override      02/26/18 1556 02/26/18 1630   02/21/18 0900  sulfamethoxazole-trimethoprim (BACTRIM DS,SEPTRA DS) 800-160 MG per tablet 1 tablet     1 tablet Oral Once per day on Mon Wed Fri 02/20/18 1116     02/13/18 1900  cefTRIAXone (ROCEPHIN) 1 g in sodium chloride 0.9 % 100 mL IVPB     1 g 200 mL/hr over 30 Minutes Intravenous Every 24 hours 02/12/18 2122 02/18/18 2014   02/13/18 1900  azithromycin (ZITHROMAX) 500 mg in sodium chloride 0.9 % 250 mL IVPB  Status:  Discontinued     500 mg 250 mL/hr over 60 Minutes Intravenous Every 24 hours 02/12/18 2122 02/14/18 0819   02/12/18 1915  cefTRIAXone (ROCEPHIN) 2 g in sodium chloride 0.9 % 100 mL IVPB  Status:  Discontinued     2 g 200 mL/hr over 30 Minutes Intravenous Every 24 hours 02/12/18 1901 02/12/18 2125   02/12/18 1915  azithromycin (ZITHROMAX) 500 mg in sodium chloride 0.9 % 250 mL IVPB  Status:  Discontinued     500 mg 250 mL/hr over 60 Minutes Intravenous Every 24 hours 02/12/18 1901 02/12/18 2125        Objective:   Vitals:   03/09/18 1717 03/09/18 2316 03/10/18 0834 03/10/18 1731  BP: (!) 153/82 (!) 158/86 135/72 (!) 142/74  Pulse: 68 66 79 73  Resp: 18 17 17 18   Temp: 98.4 F (36.9 C) 98.4 F (36.9 C) 97.6 F (36.4 C) 98.6 F (37 C)  TempSrc: Oral Oral Oral Oral  SpO2: 100% 100% 97% 100%  Weight:      Height:        Wt Readings from Last 3 Encounters:  03/08/18 73.6 kg (162 lb 4.1 oz)     Intake/Output Summary (Last 24 hours) at 03/10/2018 1752 Last data filed at 03/10/2018 1434 Gross per 24 hour  Intake 661.34 ml  Output -  Net 661.34 ml     Physical Exam  Awake Alert, Oriented X 3, No new F.N deficits, Normal affect [place].AT,PERRAL Supple Neck,No JVD, No cervical lymphadenopathy  appriciated.  Symmetrical Chest wall movement, Good air movement bilaterally, CTAB RRR,No Gallops,Rubs or new Murmurs, No Parasternal Heave +ve B.Sounds, Abd Soft, No tenderness, No organomegaly appriciated, No rebound - guarding or rigidity. No Cyanosis, Clubbing or edema, No new Rash or bruise    L avf Dialysis catheter in the right upper chest    Data Review:    CBC Recent Labs  Lab 03/04/18 0325 03/05/18 0328 03/06/18 0312 03/07/18 0316 03/08/18 0231 03/09/18 0843  WBC 13.9* 10.8* 6.1 6.5 8.7 8.9  HGB 7.8* 7.1* 7.5* 7.0* 7.0* 7.4*  HCT 23.8* 21.8* 22.9* 21.7* 21.3* 23.2*  PLT 122* 100* 94* 71* 84* 131*  MCV 80.4 80.4 80.6 80.4 80.4 81.1  MCH 26.4 26.2 26.4 25.9* 26.4 25.9*  MCHC 32.8 32.6 32.8 32.3 32.9 31.9  RDW 20.9* 21.0* 21.3* 20.7* 20.5* 21.1*  LYMPHSABS 0.5* 1.1  --   --   --   --   MONOABS 0.6 0.7  --   --   --   --   EOSABS 0.0 0.0  --   --   --   --   BASOSABS 0.0 0.0  --   --   --   --     Chemistries  Recent Labs  Lab 03/05/18 0328 03/06/18 4268 03/07/18 0316 03/08/18 0231  03/09/18 0843  NA 137 140 137 137 136  K 3.9 4.7 4.3 4.7 3.9  CL 102 104 99 99 98  CO2 29 27 29 30 26   GLUCOSE 96 106* 214* 152* 216*  BUN 47* 61* 36* 61* 40*  CREATININE 4.11* 5.21* 3.52* 5.47* 3.74*  CALCIUM 7.5* 7.5* 7.3* 7.8* 7.9*  AST  --  33  --   --   --   ALT  --  18  --   --   --   ALKPHOS  --  37*  --   --   --   BILITOT  --  0.4  --   --   --    ------------------------------------------------------------------------------------------------------------------ No results for input(s): CHOL, HDL, LDLCALC, TRIG, CHOLHDL, LDLDIRECT in the last 72 hours.  Lab Results  Component Value Date   HGBA1C 6.8 (H) 03/03/2018   ------------------------------------------------------------------------------------------------------------------ No results for input(s): TSH, T4TOTAL, T3FREE, THYROIDAB in the last 72 hours.  Invalid input(s):  FREET3 ------------------------------------------------------------------------------------------------------------------ No results for input(s): VITAMINB12, FOLATE, FERRITIN, TIBC, IRON, RETICCTPCT in the last 72 hours.  Coagulation profile No results for input(s): INR, PROTIME in the last 168 hours.  No results for input(s): DDIMER in the last 72 hours.  Cardiac Enzymes No results for input(s): CKMB, TROPONINI, MYOGLOBIN in the last 168 hours.  Invalid input(s): CK ------------------------------------------------------------------------------------------------------------------    Component Value Date/Time   BNP 225.7 (H) 02/12/2018 1534    Inpatient Medications  Scheduled Meds: . Chlorhexidine Gluconate Cloth  6 each Topical Q0600  . cyclophosphamide  100 mg Oral Daily  . darbepoetin (ARANESP) injection - DIALYSIS  40 mcg Intravenous Q Tue-HD  . famotidine  20 mg Oral Daily  . insulin aspart  0-9 Units Subcutaneous TID WC  . insulin aspart  3 Units Subcutaneous TID WC  . insulin glargine  5 Units Subcutaneous QHS  . multivitamin with minerals  1 tablet Oral Daily  . polyethylene glycol  17 g Oral Daily  . predniSONE  60 mg Oral Q breakfast  . sodium chloride flush  3 mL Intravenous Q12H  . sulfamethoxazole-trimethoprim  1 tablet Oral Once per day on Mon Wed Fri  . thiamine  100 mg Oral Daily   Continuous Infusions: . sodium chloride 10 mL/hr at 03/05/18 1341  . sodium chloride 10 mL/hr at 03/06/18 1429   PRN Meds:.alum & mag hydroxide-simeth, haloperidol lactate, heparin, hydrALAZINE, HYDROcodone-acetaminophen, HYDROmorphone (DILAUDID) injection, LORazepam, ondansetron **OR** ondansetron (ZOFRAN) IV, senna-docusate, zolpidem  Micro Results Recent Results (from the past 240 hour(s))  Surgical pcr screen     Status: None   Collection Time: 03/04/18  4:52 AM  Result Value Ref Range Status   MRSA, PCR NEGATIVE NEGATIVE Final   Staphylococcus aureus NEGATIVE NEGATIVE  Final    Comment: (NOTE) The Xpert SA Assay (FDA approved for NASAL specimens in patients 35 years of age and older), is one component of a comprehensive surveillance program. It is not intended to diagnose infection nor to guide or monitor treatment. Performed at [facility], [place] [address], [place], [place] [address]   Culture, blood (routine x 2)     Status: None (Preliminary result)   Collection Time: 03/06/18  6:30 PM  Result Value Ref Range Status   Specimen Description BLOOD RIGHT ARM  Final   Special Requests   Final    BOTTLES DRAWN AEROBIC AND ANAEROBIC Blood Culture adequate volume   Culture   Final    NO GROWTH 4 DAYS Performed at  [facility], [place] [address] [place], [place] [address]    Report Status PENDING  Incomplete  Culture, blood (routine x 2)     Status: None (Preliminary result)   Collection Time: 03/06/18  6:37 PM  Result Value Ref Range Status   Specimen Description BLOOD RIGHT ARM  Final   Special Requests   Final    BOTTLES DRAWN AEROBIC AND ANAEROBIC Blood Culture adequate volume   Culture   Final    NO GROWTH 4 DAYS Performed at [facility], [place] [address], [place], [place] [address]    Report Status PENDING  Incomplete    Radiology Reports Ct Abdomen Pelvis Wo Contrast  Result Date: 02/12/2018 CLINICAL DATA:  Epigastric abdominal pain for the past week. EXAM: CT ABDOMEN AND PELVIS WITHOUT CONTRAST TECHNIQUE: Multidetector CT imaging of the abdomen and pelvis was performed following the standard protocol without IV contrast. COMPARISON:  CT abdomen pelvis dated February 19, 2010. FINDINGS: Lower chest: Confluent ground-glass density at the right greater than left lung bases with inter and intralobular septal thickening, consistent with crazy paving pattern. This spares the subpleural lung. Hepatobiliary: No focal liver abnormality is seen. Status post cholecystectomy. Mild central intrahepatic and common bile duct dilatation, likely  related to post cholecystectomy state. Pancreas: Unremarkable. No pancreatic ductal dilatation or surrounding inflammatory changes. Spleen: Normal in size without focal abnormality. Adrenals/Urinary Tract: Adrenal glands are unremarkable. Kidneys are normal, without renal calculi, focal lesion, or hydronephrosis. Bladder is unremarkable. Stomach/Bowel: Stomach is within normal limits. Appendix appears normal. No evidence of bowel wall thickening, distention, or inflammatory changes. Vascular/Lymphatic: Aortic atherosclerosis. No enlarged abdominal or pelvic lymph nodes. Reproductive: Prior hysterectomy. 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 Other: Tiny fat containing umbilical hernia. No free fluid or pneumoperitoneum. Musculoskeletal: No acute or significant osseous findings. 3 mm anterolisthesis at L4-L5 due to severe facet arthropathy. Which IMPRESSION: 1.  No acute intra-abdominal process. 2. Confluent ground-glass density and inter and intralobular septal thickening at the right greater than left lung bases, consistent with crazy paving pattern. While this pattern is nonspecific, given the sparing of the subpleural lung, this may be related to cryptogenic organizing pneumonia. Pulmonology consultation is recommended. 3. 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 Electronically Signed   By: Titus Dubin M.D.   On: 02/12/2018 20:01   Dg Chest 1 View  Result Date: 02/14/2018 CLINICAL DATA:  Shortness of breath EXAM: CHEST  1 VIEW COMPARISON:  02/12/2018 FINDINGS: Severe diffuse bilateral disease throughout the lungs, stable. Cardiomegaly. No visible effusions or acute bony abnormality. IMPRESSION: Severe bilateral airspace disease could reflect edema/CHF or infection. No change. Electronically Signed   By: Rolm Baptise M.D.   On: 02/14/2018 09:38   US Renal  Result Date: 02/14/2018 CLINICAL DATA:  Acute kidney injury. EXAM: RENAL / URINARY TRACT ULTRASOUND COMPLETE COMPARISON:  CT abdomen pelvis  dated February 12, 2018. FINDINGS: Right Kidney: Length: 11.0 cm. Increased echogenicity. No mass or hydronephrosis visualized. Left Kidney: Length: 10.9 cm. Increased echogenicity. No mass or hydronephrosis visualized. Bladder: Decompressed by a Foley catheter. IMPRESSION: Increased renal cortical echogenicity bilaterally, consistent with medical renal disease. Electronically Signed   By: Titus Dubin M.D.   On: 02/14/2018 22:34   Ct Chest High Resolution  Result Date: 02/18/2018 CLINICAL DATA:  62 year old female with history of alcohol and cocaine abuse presenting with dyspnea and productive cough. Abnormal chest x-ray. EXAM: CT CHEST WITHOUT CONTRAST TECHNIQUE: Multidetector CT imaging of the  chest was performed following the standard protocol without intravenous contrast. High resolution imaging of the lungs, as well as inspiratory and expiratory imaging, was performed. COMPARISON:  No prior chest CT.  Chest x-ray 02/14/2018. FINDINGS: Cardiovascular: Heart size is mildly enlarged. There is no significant pericardial fluid, thickening or pericardial calcification. Aortic atherosclerosis. No definite coronary artery calcifications. Mediastinum/Nodes: No pathologically enlarged mediastinal or hilar lymph nodes. Please note that accurate exclusion of hilar adenopathy is limited on noncontrast CT scans. Esophagus is unremarkable in appearance. No axillary lymphadenopathy. Lungs/Pleura: High-resolution images demonstrate widespread areas of ground-glass attenuation and septal thickening, as well as widespread areas of peripheral bronchiolectasis. Subpleural sparing is noted, particularly throughout the mid to lower lungs. These findings have no definitive craniocaudal gradient, but appear slightly more pronounced throughout the mid to upper lungs than the lower lungs. No honeycombing. Inspiratory and expiratory imaging is unremarkable. Linear area of scarring or atelectasis in the periphery of the left lower lobe.  Trace left pleural effusion lying dependently. Upper Abdomen: Unremarkable. Musculoskeletal: There are no aggressive appearing lytic or blastic lesions noted in the visualized portions of the skeleton. IMPRESSION: 1. The appearance of the lungs is nonspecific, but there are imaging findings that are suggestive of cryptogenic organizing pneumonia (COP). Other differential considerations include both cardiogenic and noncardiogenic edema, however, this is not strongly favored. Evaluation by Pulmonology is strongly recommended. 2. Aortic atherosclerosis. Aortic Atherosclerosis (ICD10-I70.0). Electronically Signed   By: Vinnie Langton M.D.   On: 02/18/2018 08:37   Ir Fluoro Guide Cv Line Right  Result Date: 02/26/2018 INDICATION: 60 year old with acute kidney injury and on dialysis. Patient currently has a non tunneled dialysis catheter and needs conversion to a tunneled dialysis catheter. EXAM: CONVERSION OF NON TUNNELED DIALYSIS CATHETER TO A TUNNELED DIALYSIS CATHETER WITH FLUOROSCOPY Physician: Stephan Minister. Anselm Pancoast, MD MEDICATIONS: Ancef 2 g; The antibiotic was administered within an appropriate time interval prior to skin puncture. ANESTHESIA/SEDATION: Versed 1.5 mg IV; Fentanyl 100 mcg IV; Moderate Sedation Time:  20 minutes The patient was continuously monitored during the procedure by the interventional radiology nurse under my direct supervision. FLUOROSCOPY TIME:  Fluoroscopy Time: 18 seconds, 1 mGy COMPLICATIONS: None immediate. PROCEDURE: The existing right jugular dialysis catheter was evaluated. This catheter was felt to be adequate for conversion to a tunneled catheter. The procedure was explained to the patient. The risks and benefits of the procedure were discussed and the patient's questions were addressed. Informed consent was obtained from the patient. The existing catheter and the right upper chest were prepped and draped in a sterile fashion. Maximal barrier sterile technique was utilized including  caps, mask, sterile gowns, sterile gloves, sterile drape, hand hygiene and skin antiseptic. The skin below the right clavicle and the skin around the catheter were anesthetized with 1% lidocaine. A small incision was made below the right clavicle. A subcutaneous tunnel was formed from the incision to the old catheter site. The old catheter was removed over a Amplatz wire. The wire was advanced into the IVC. A 19 cm tip to cuff Palindrome catheter was pulled through the subcutaneous tunnel. Peel-away sheath was placed over the Amplatz wire. The catheter was placed through the peel-away sheath and positioned at the superior cavoatrial junction. Both lumens aspirated and flushed well. The catheter lumens were flushed with appropriate amount of heparin. The catheter was sutured to skin with Prolene suture. The old jugular right access site was closed using 2 interrupted Ethilon sutures. Fluoroscopic images were taken and saved for this procedure.  FINDINGS: Catheter tip at the superior cavoatrial junction. IMPRESSION: Successful conversion of the right jugular catheter to a tunneled dialysis catheter. Right neck sutures can be removed in 10-14 days. Electronically Signed   By: Markus Daft M.D.   On: 02/26/2018 17:16   Ir Fluoro Guide Cv Line Right  Result Date: 02/18/2018 INDICATION: 62 year old female with renal failure in need of hemodialysis EXAM: IR RIGHT FLOURO GUIDE CV LINE; IR ULTRASOUND GUIDANCE VASC ACCESS RIGHT MEDICATIONS: None ANESTHESIA/SEDATION: None FLUOROSCOPY TIME:  Fluoroscopy Time: 0 minutes 6 seconds (0 mGy). COMPLICATIONS: None immediate. PROCEDURE: Informed written consent was obtained from the patient after a thorough discussion of the procedural risks, benefits and alternatives. All questions were addressed. Maximal Sterile Barrier Technique was utilized including caps, mask, sterile gowns, sterile gloves, sterile drape, hand hygiene and skin antiseptic. A timeout was performed prior to the  initiation of the procedure. The right internal jugular vein was interrogated with ultrasound and found to be widely patent. An image was obtained and stored for the medical record. Local anesthesia was attained by infiltration with 1% lidocaine. A small dermatotomy was made. Under real-time sonographic guidance, the vessel was punctured with a 21 gauge micropuncture needle. Using standard technique, the initial micro needle was exchanged over a 0.018 micro wire for a transitional 4 [place] micro sheath. The micro sheath was then exchanged over a 0.035 wire for a dilator which was used to dilate the soft tissue tract. A 20 cm 3 lumen non tunneled dialysis catheter was then advanced over the wire and position with the tip in the upper right atrium. The catheter flushed and aspirated with ease. The catheter was flushed with heparinized saline and secured to the skin with 0 Prolene suture. The patient tolerated the procedure well. IMPRESSION: Successful placement of a non tunneled Trialysis catheter via the right internal jugular vein. The catheter tip is in the upper right atrium and ready for immediate use. Signed, Criselda Peaches, MD [facility] Electronically Signed   By: Jacqulynn Cadet M.D.   On: 02/18/2018 16:08   Ir US Guide Vasc Access Right  Result Date: 02/18/2018 INDICATION: 62 year old female with renal failure in need of hemodialysis EXAM: IR RIGHT FLOURO GUIDE CV LINE; IR ULTRASOUND GUIDANCE VASC ACCESS RIGHT MEDICATIONS: None ANESTHESIA/SEDATION: None FLUOROSCOPY TIME:  Fluoroscopy Time: 0 minutes 6 seconds (0 mGy). COMPLICATIONS: None immediate. PROCEDURE: Informed written consent was obtained from the patient after a thorough discussion of the procedural risks, benefits and alternatives. All questions were addressed. Maximal Sterile Barrier Technique was utilized including caps, mask, sterile gowns, sterile gloves, sterile drape, hand  hygiene and skin antiseptic. A timeout was performed prior to the initiation of the procedure. The right internal jugular vein was interrogated with ultrasound and found to be widely patent. An image was obtained and stored for the medical record. Local anesthesia was attained by infiltration with 1% lidocaine. A small dermatotomy was made. Under real-time sonographic guidance, the vessel was punctured with a 21 gauge micropuncture needle. Using standard technique, the initial micro needle was exchanged over a 0.018 micro wire for a transitional 4 [place] micro sheath. The micro sheath was then exchanged over a 0.035 wire for a dilator which was used to dilate the soft tissue tract. A 20 cm 3 lumen non tunneled dialysis catheter was then advanced over the wire and position with the tip in the upper right atrium. The catheter flushed and aspirated with ease. The catheter was flushed with  heparinized saline and secured to the skin with 0 Prolene suture. The patient tolerated the procedure well. IMPRESSION: Successful placement of a non tunneled Trialysis catheter via the right internal jugular vein. The catheter tip is in the upper right atrium and ready for immediate use. Signed, Criselda Peaches, MD [facility] Radiology Electronically Signed   By: Jacqulynn Cadet M.D.   On: 02/18/2018 16:08   Dg Chest Port 1 View  Result Date: 03/06/2018 CLINICAL DATA:  Fever. EXAM: PORTABLE CHEST 1 VIEW COMPARISON:  CT 02/17/2018.  Chest x-ray 02/14/2018. FINDINGS: Right IJ dual-lumen catheter with tip over cavoatrial junction. Cardiomegaly. No focal infiltrate. No pleural effusion or pneumothorax. No acute bony abnormality. J IMPRESSION: 1.  Right IJ dual-lumen catheter with tip over cavoatrial junction. 2. No acute cardiopulmonary disease. No evidence of pulmonary infiltrate. Electronically Signed   By: Marcello Moores  Register   On: 03/06/2018 13:47   Dg Chest Port 1  View  Result Date: 02/12/2018 CLINICAL DATA:  Hypoxia. EXAM: PORTABLE CHEST 1 VIEW COMPARISON:  Chest x-ray dated December 10, 2011. FINDINGS: The heart size and mediastinal contours are within normal limits. Normal pulmonary vascularity. Confluent airspace disease involving the right central lung and left perihilar lung. No pleural effusion or pneumothorax. No acute osseous abnormality. IMPRESSION: 1. Confluent airspace disease in the right greater than left lungs. Electronically Signed   By: Titus Dubin M.D.   On: 02/12/2018 18:53   US Biopsy (kidney)  Result Date: 02/17/2018 CLINICAL DATA:  Acute kidney injury and microscopic hematuria. Need for renal biopsy. EXAM: ULTRASOUND GUIDED CORE BIOPSY OF LEFT KIDNEY MEDICATIONS: 1.5 mg IV Versed; 25 mcg IV Fentanyl Total Moderate Sedation Time: 17 minutes. [name] level of consciousness and physiologic status were continuously monitored during the procedure by Radiology nursing. The patient was also given 20 mg of IV hydralazine at the beginning of the procedure and prior to needle advancement to treat hypertension. PROCEDURE: The procedure, risks, benefits, and alternatives were explained to the patient. Questions regarding the procedure were encouraged and answered. The patient understands and consents to the procedure. A time out was performed prior to initiating the procedure. Ultrasound was utilized to image both kidneys. The left flank region was prepped with chlorhexidine in a sterile fashion, and a sterile drape was applied covering the operative field. A sterile gown and sterile gloves were used for the procedure. Local anesthesia was provided with 1% Lidocaine. Core biopsy was performed at the level of lower pole cortex of the left kidney with a 16 gauge needle device. Two separate core biopsy samples were obtained and submitted in saline. Additional ultrasound was performed. COMPLICATIONS: None. FINDINGS: Both kidneys were well visualized by  ultrasound. The left kidney was chosen for biopsy. Solid tissue was obtained from the level of the lower pole cortex. Ultrasound shows no evidence of immediate bleeding complication. IMPRESSION: Ultrasound-guided core biopsy performed of the left kidney at the level of lower pole cortex. Electronically Signed   By: Aletta Edouard M.D.   On: 02/17/2018 13:07    Time Spent in minutes  30   Jani Gravel M.D on 03/10/2018 at 5:52 PM  Between 7am to 7pm - Pager - [phone]    After 7pm go to [email or web address] - password [email or web address]  [facility] -  Office  [phone]

## 2018-03-11 LAB — CULTURE, BLOOD (ROUTINE X 2)
CULTURE: NO GROWTH
Culture: NO GROWTH
SPECIAL REQUESTS: ADEQUATE
Special Requests: ADEQUATE

## 2018-03-11 LAB — GLUCOSE, CAPILLARY
GLUCOSE-CAPILLARY: 195 mg/dL — AB (ref 70–99)
GLUCOSE-CAPILLARY: 190 mg/dL — AB (ref 70–99)
Glucose-Capillary: 152 mg/dL — ABNORMAL HIGH (ref 70–99)

## 2018-03-11 LAB — RENAL FUNCTION PANEL
ALBUMIN: 3.1 g/dL — AB (ref 3.5–5.0)
ANION GAP: 10 (ref 5–15)
BUN: 78 mg/dL — ABNORMAL HIGH (ref 8–23)
CALCIUM: 7.8 mg/dL — AB (ref 8.9–10.3)
CO2: 25 mmol/L (ref 22–32)
Chloride: 98 mmol/L (ref 98–111)
Creatinine, Ser: 6 mg/dL — ABNORMAL HIGH (ref 0.44–1.00)
GFR, EST AFRICAN AMERICAN: 8 mL/min — AB (ref >60)
GFR, EST NON AFRICAN AMERICAN: 7 mL/min — AB (ref >60)
GLUCOSE: 180 mg/dL — AB (ref 70–99)
Phosphorus: 3.4 mg/dL (ref 2.5–4.6)
Potassium: 4.7 mmol/L (ref 3.5–5.1)
SODIUM: 133 mmol/L — AB (ref 135–145)

## 2018-03-11 LAB — CBC
HCT: 20.4 % — ABNORMAL LOW (ref 36.0–46.0)
HEMOGLOBIN: 6.6 g/dL — AB (ref 12.0–15.0)
MCH: 26.1 pg (ref 26.0–34.0)
MCHC: 32.4 g/dL (ref 30.0–36.0)
MCV: 80.6 fL (ref 78.0–100.0)
Platelets: 149 10*3/uL — ABNORMAL LOW (ref 150–400)
RBC: 2.53 MIL/uL — ABNORMAL LOW (ref 3.87–5.11)
RDW: 20.8 % — ABNORMAL HIGH (ref 11.5–15.5)
WBC: 8.4 10*3/uL (ref 4.0–10.5)

## 2018-03-11 LAB — PREPARE RBC (CROSSMATCH)

## 2018-03-11 MED ORDER — SODIUM CHLORIDE 0.9% IV SOLUTION
Freq: Once | INTRAVENOUS | Status: AC
  Administered 2018-03-11: 12:00:00 via INTRAVENOUS

## 2018-03-11 NOTE — Progress Notes (Signed)
Inpatient Diabetes Program Recommendations  AACE/ADA: New Consensus Statement on Inpatient Glycemic Control (2015)  Target Ranges:  Prepandial:   less than 140 mg/dL      Peak postprandial:   less than 180 mg/dL (1-2 hours)      Critically ill patients:  140 - 180 mg/dL   Lab Results  Component Value Date   GLUCAP 234 (H) 03/10/2018   HGBA1C 6.8 (H) 03/03/2018    Review of Glycemic Control Results for RIE, MCNEIL (MRN [id number]) as of 03/11/2018 11:50  Ref. Range 03/10/2018 07:55 03/10/2018 12:03 03/10/2018 16:47 03/10/2018 20:46  Glucose-Capillary Latest Ref Range: 70 - 99 mg/dL 84 129 (H) 233 (H) 234 (H)   Diabetes history: No previous history of DM Outpatient Diabetes medications: None Current orders for Inpatient glycemic control: Lantus 5 units QHS, Novolog sensitive tid with meals and HS, Novolog 3 units tid with meals Prednisone 60 mg daily  Inpatient Diabetes Program Recommendations:    In the setting of steroids and increase in post prandials, consider increasing Novolog 4 units TID (assuming that patient is consuming >50%). Would need to adjust once steroids are DCd.   Thanks, Bronson Curb, MSN, RNC-OB Diabetes Coordinator [phone] (8a-5p)

## 2018-03-11 NOTE — Progress Notes (Signed)
VASCULAR SURGERY ASSESSMENT & PLAN:   This patient had a left brachiocephalic fistula placed by Dr. Sherren Mocha Early on 03/05/2018.  She had some mild swelling in her arm and was explored the following day for swelling and was found to only have a very small hematoma.  She is had some persistent swelling in the arm and we were asked to reevaluate her today.  I do not see any significant wound problems.  She has a good thrill in her fistula.  She has moderate swelling in the left upper arm and could potentially be related to a venous outflow obstruction.  I have encouraged her to elevate her arm above her heart.  If this worsens, the only options would be either ligation of the fistula or a fistulogram in approximately 6 weeks to look for central venous outflow stenosis which could potentially be addressed with venoplasty.  However currently I would favor a conservative approach with arm elevation.  She can follow-up with Dr. Donnetta Hutching in the office and should already have an appointment.  SUBJECTIVE:   Complains of swelling in the left upper arm  PHYSICAL EXAM:   Vitals:   03/11/18 1043 03/11/18 1100 03/11/18 1130 03/11/18 1135  BP: (!) 158/89 (!) 173/94 (!) 164/95 (!) 150/83  Pulse: 70 63 63 73  Resp: 16   16  Temp: 98.6 F (37 C)   98.3 F (36.8 C)  TempSrc: Oral   Oral  SpO2:    98%  Weight:    165 lb 12.6 oz (75.2 kg)  Height:       Moderate swelling left upper arm Palpable thrill in left upper arm fistula Palpable left radial pulse.  LABS:   Lab Results  Component Value Date   WBC 8.4 03/11/2018   HGB 6.6 (LL) 03/11/2018   HCT 20.4 (L) 03/11/2018   MCV 80.6 03/11/2018   PLT 149 (L) 03/11/2018   Lab Results  Component Value Date   CREATININE 6.00 (H) 03/11/2018   Lab Results  Component Value Date   INR 1.20 02/25/2018   CBG (last 3)  Recent Labs    03/10/18 1647 03/10/18 2046 03/11/18 1219  GLUCAP 233* 234* 152*    PROBLEM LIST:    Principal Problem:  PNEUMONIA Active Problems:   ELEVATED BLOOD PRESSURE WITHOUT DIAGNOSIS OF HYPERTENSION   Renal failure   Acute respiratory failure with hypoxia (HCC)   Alcohol abuse   Anemia   Cocaine abuse ([place])   Epigastric pain   Pneumonia   Pulmonary infiltrate   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 Chlorhexidine Gluconate Cloth  6 each Topical Q0600  . cyclophosphamide  100 mg Oral Daily  . darbepoetin (ARANESP) injection - DIALYSIS  40 mcg Intravenous Q Tue-HD  . famotidine  20 mg Oral Daily  . insulin aspart  0-9 Units Subcutaneous TID WC  . insulin aspart  3 Units Subcutaneous TID WC  . insulin glargine  5 Units Subcutaneous QHS  . multivitamin with minerals  1 tablet Oral Daily  . polyethylene glycol  17 g Oral Daily  . predniSONE  60 mg Oral Q breakfast  . sodium chloride flush  3 mL Intravenous Q12H  . sulfamethoxazole-trimethoprim  1 tablet Oral Once per day on Mon Wed Fri  . thiamine  100 mg Oral Daily  Deitra Mayo Beeper: [phone] Office: [phone] 03/11/2018

## 2018-03-11 NOTE — Progress Notes (Signed)
PT Cancellation Note  Patient Details Name: Rhonda Lynch MRN: [id number] DOB: 1956/06/11   Cancelled Treatment:    Reason Eval/Treat Not Completed: Patient at procedure or test/unavailable (HD). Will follow-up for PT treatment as schedule permits.  Mabeline Caras, PT, DPT Acute Rehab Services  Pager: [phone] 03/11/2018, 7:48 AM

## 2018-03-11 NOTE — Progress Notes (Signed)
[name], female   DOB: 1956-01-02, 62 y.o.   MRN: [id number]                                                                PROGRESS NOTE                                                                                                                                                                                                             Patient Demographics:    Rhonda Lynch, is a 62 y.o. female, DOB - May 07, 1956, DVV:[id number]  Admit date - 02/12/2018   Admitting Physician Vianne Bulls, MD  Outpatient Primary MD for the patient is Patient, No Pcp Per  LOS - 27  Outpatient Specialists:  Chief Complaint  Patient presents with  . Abdominal Pain       Brief Narrative   62 year old female with past medical history of cocaine/alcohol abuse who was admitted on 02/12/2018 with abdominal pain, nausea, vomiting. She was found to have Streptococcus pneumonia and also had acute kidney injury on presentation. Nephrology was consulted . 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 She has progressed to end-stage renal disease and is currently undergoing dialysis, plasmapheresis and has been started, on steroids and cyclophosphamide. Discharge planning after setting of outpatient dialysis facility. Received AV graft on 6/26/2019with hematoma which was subsequently evacuated.      Subjective:    Rhonda Lynch today has slight warmth and redness around her left avf.     No headache, No chest pain, No abdominal pain - No Nausea, No new weakness tingling or numbness, No Cough - SOB.   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   Left arm pain status post AV graft6/26/2019placementwithsubsequent hematoma status post evacuation on 03/06/2018. vascular surgery updated, will await their recommendations.   Hypoglycemiawith a history of diabetes mellitus. This has improved with decreasing the dose of Lantus to 5 units at bedtime. Patient will likely need this lower dose of Lantus on discharge. Will continue to monitor  Mild anemia in the context of recent hematoma likely acute blood loss on the background of anemia of chronic kidney disease.. Might need blood transfusion if continues to goal below 7. Could be considered with hemodialysis.  S/p transfusion 7/2 2 Units prbc Check CBC in a.m.  Low-grade fever6/27/2019.Since then afebrileBlood cultures drawn6/27negative Chest x-ray done yesterday did not see any obvious infiltrate.  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 Nephrology on board. Patient is on hemodialysis since 6/12 and received plasmapheresisfor 7 daysas well. on prednisone and cyclophosphamide. Cyclophosphamide started on 02/18/2018. On Bactrim for PCP prophylaxis. Status post tunnel catheter placement by IR. AV graft placement on 03/05/2018 by vascular surgery.Status post hematoma evacuation status post AV graft placement,on 03/06/2018. Awaiting CLIP   Pancytopenia. Trending down leukocytosis platelet count and anemia. Patient is on cyclophosphamide and Bactrim. Nephrology aware. Will need to closely monitor. Getting Aranesp.If there is worsening pancytopenia, consider hematology  evaluation.  Diffuse alveolar hemorrhage versus bronchiolitis obliterans or cryptogenic organizing pneumonia as per the imaging. No active issues at this time. X-ray did not show any obvious findings on 6/27/2019and pulmonary followed the patient during hospitalization. Currently stable. 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   Sepsis secondary to streptococcal pneumonia: Urine antigen was positive for streptococcus. Blood cultures have been negative. Completed the course of antibiotics. 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 Patient will need to follow-up with infectious disease as outpatient.   History of cocaine abuse: UDS was positive for cocaine on admission. Currently stable. No agitation or anxiety noted  Leukocytosis:WBC trending down. On cyclophosphamide. On steroids. Will need to closely monitor for downtrending WBC.      Code Status:FULL CODE  Family Communication :w patient  Disposition Plan:home once cleared by nephrology  Barriers For Discharge: CLIP  Consults :nephrology, vascular  Procedures : L AVF 6/26, and L AVF exploration 6/27  DVT Prophylaxis: Heparin - SCDs      RecentLabs  Lab Results  Component Value Date   PLT 131 (L) 03/09/2018      Antibiotics  :  Cefazolin 6/27      Anti-infectives (From admission, onward)   Start     Dose/Rate Route Frequency Ordered Stop   03/06/18 1425  ceFAZolin (ANCEF) 2-4 GM/100ML-% IVPB    Note to Pharmacy:  [facility]   : cabinet override      03/06/18 1425 03/07/18 0229   03/05/18 1415  ceFAZolin (ANCEF) IVPB 2g/100 mL premix     2 g 200 mL/hr over 30 Minutes Intravenous  Once 03/05/18 1401 03/05/18 1445   03/05/18 1403  ceFAZolin (ANCEF) 2-4 GM/100ML-% IVPB    Note to Pharmacy:  [facility]   : cabinet override      03/05/18 1403 03/05/18 1445   02/26/18 1615  ceFAZolin  (ANCEF) IVPB 2g/100 mL premix  Status:  Discontinued     2 g 200 mL/hr over 30 Minutes Intravenous  Once 02/26/18 1608 03/04/18 1020   02/26/18 1556  ceFAZolin (ANCEF) 2-4 GM/100ML-% IVPB    Note to Pharmacy:  [facility]   : cabinet override      02/26/18 1556 02/26/18 1630   02/21/18 0900  sulfamethoxazole-trimethoprim (BACTRIM DS,SEPTRA DS) 800-160 MG per tablet 1 tablet     1 tablet Oral Once per day on Mon Wed Fri 02/20/18 1116     02/13/18 1900  cefTRIAXone (ROCEPHIN) 1 g in sodium chloride 0.9 % 100 mL IVPB     1 g 200 mL/hr over 30 Minutes Intravenous Every 24 hours 02/12/18 2122 02/18/18 2014   02/13/18 1900  azithromycin (ZITHROMAX) 500 mg in sodium chloride 0.9 % 250 mL IVPB  Status:  Discontinued     500 mg 250 mL/hr over 60 Minutes Intravenous Every 24 hours 02/12/18 2122 02/14/18 0819   02/12/18 1915  cefTRIAXone (ROCEPHIN) 2 g in sodium chloride 0.9 % 100 mL IVPB  Status:  Discontinued     2 g 200 mL/hr over 30 Minutes Intravenous Every 24 hours 02/12/18 1901 02/12/18 2125   02/12/18 1915  azithromycin (ZITHROMAX) 500 mg in sodium chloride 0.9 % 250 mL IVPB  Status:  Discontinued     500 mg 250 mL/hr over 60 Minutes Intravenous Every 24 hours 02/12/18 1901 02/12/18 2125        Objective:   Vitals:   03/11/18 0735 03/11/18 0740 03/11/18 0800 03/11/18 0830  BP: (!) 155/88 108/83 (!) 124/107 (!) 143/82  Pulse: 61 75 66 73  Resp:      Temp:      TempSrc:      SpO2:      Weight:      Height:        Wt Readings from Last 3 Encounters:  03/11/18 76.8 kg (169 lb 5 oz)     Intake/Output Summary (Last 24 hours) at 03/11/2018 0902 Last data filed at 03/10/2018 2120 Gross per 24 hour  Intake 414.34 ml  Output -  Net 414.34 ml     Physical Exam  Awake Alert, Oriented X 3, No new F.N deficits, Normal affect [place].AT,PERRAL Supple Neck,No JVD, No cervical lymphadenopathy appriciated.  Symmetrical Chest wall movement, Good air movement bilaterally, CTAB RRR,No  Gallops,Rubs or new Murmurs, No Parasternal Heave +ve B.Sounds, Abd Soft, No tenderness, No organomegaly appriciated, No rebound - guarding or rigidity. No Cyanosis, Clubbing or edema, No new Rash or bruise   LAVF, slight warmth and redness around the flistula.  Dialysis catheter in the right upper chest    Data Review:    CBC Recent Labs  Lab 03/05/18 0328  03/07/18 0316 03/08/18 0231 03/09/18 0843 03/10/18 1713 03/11/18 0740  WBC 10.8*   < > 6.5 8.7 8.9 7.5 8.4  HGB 7.1*   < > 7.0* 7.0* 7.4* 7.1* 6.6*  HCT 21.8*   < > 21.7* 21.3* 23.2* 22.1* 20.4*  PLT 100*   < > 71* 84* 131* 136* 149*  MCV 80.4   < > 80.4 80.4 81.1 82.2 80.6  MCH 26.2   < > 25.9* 26.4 25.9* 26.4 26.1  MCHC 32.6   < > 32.3 32.9 31.9 32.1 32.4  RDW 21.0*   < > 20.7* 20.5* 21.1* 20.9* 20.8*  LYMPHSABS 1.1  --   --   --   --   --   --   MONOABS 0.7  --   --   --   --   --   --   EOSABS 0.0  --   --   --   --   --   --  BASOSABS 0.0  --   --   --   --   --   --    < > = values in this interval not displayed.    Chemistries  Recent Labs  Lab 03/06/18 0312 03/07/18 0316 03/08/18 0231 03/09/18 0843 03/10/18 1713 03/11/18 0256  NA 140 137 137 136 135 133*  K 4.7 4.3 4.7 3.9 4.7 4.7  CL 104 99 99 98 99 98  CO2 27 29 30 26 25 25   GLUCOSE 106* 214* 152* 216* 225* 180*  BUN 61* 36* 61* 40* 64* 78*  CREATININE 5.21* 3.52* 5.47* 3.74* 5.30* 6.00*  CALCIUM 7.5* 7.3* 7.8* 7.9* 7.9* 7.8*  AST 33  --   --   --   --   --   ALT 18  --   --   --   --   --   ALKPHOS 37*  --   --   --   --   --   BILITOT 0.4  --   --   --   --   --    ------------------------------------------------------------------------------------------------------------------ No results for input(s): CHOL, HDL, LDLCALC, TRIG, CHOLHDL, LDLDIRECT in the last 72 hours.  Lab Results  Component Value Date   HGBA1C 6.8 (H) 03/03/2018    ------------------------------------------------------------------------------------------------------------------ No results for input(s): TSH, T4TOTAL, T3FREE, THYROIDAB in the last 72 hours.  Invalid input(s): FREET3 ------------------------------------------------------------------------------------------------------------------ No results for input(s): VITAMINB12, FOLATE, FERRITIN, TIBC, IRON, RETICCTPCT in the last 72 hours.  Coagulation profile No results for input(s): INR, PROTIME in the last 168 hours.  No results for input(s): DDIMER in the last 72 hours.  Cardiac Enzymes No results for input(s): CKMB, TROPONINI, MYOGLOBIN in the last 168 hours.  Invalid input(s): CK ------------------------------------------------------------------------------------------------------------------    Component Value Date/Time   BNP 225.7 (H) 02/12/2018 1534    Inpatient Medications  Scheduled Meds: . sodium chloride   Intravenous Once  . Chlorhexidine Gluconate Cloth  6 each Topical Q0600  . cyclophosphamide  100 mg Oral Daily  . darbepoetin (ARANESP) injection - DIALYSIS  40 mcg Intravenous Q Tue-HD  . famotidine  20 mg Oral Daily  . insulin aspart  0-9 Units Subcutaneous TID WC  . insulin aspart  3 Units Subcutaneous TID WC  . insulin glargine  5 Units Subcutaneous QHS  . multivitamin with minerals  1 tablet Oral Daily  . polyethylene glycol  17 g Oral Daily  . predniSONE  60 mg Oral Q breakfast  . sodium chloride flush  3 mL Intravenous Q12H  . sulfamethoxazole-trimethoprim  1 tablet Oral Once per day on Mon Wed Fri  . thiamine  100 mg Oral Daily   Continuous Infusions: . sodium chloride 10 mL/hr at 03/05/18 1341  . sodium chloride 10 mL/hr at 03/06/18 1429   PRN Meds:.alum & mag hydroxide-simeth, haloperidol lactate, heparin, hydrALAZINE, HYDROcodone-acetaminophen, HYDROmorphone (DILAUDID) injection, LORazepam, ondansetron **OR** ondansetron (ZOFRAN) IV, senna-docusate,  zolpidem  Micro Results Recent Results (from the past 240 hour(s))  Surgical pcr screen     Status: None   Collection Time: 03/04/18  4:52 AM  Result Value Ref Range Status   MRSA, PCR NEGATIVE NEGATIVE Final   Staphylococcus aureus NEGATIVE NEGATIVE Final    Comment: (NOTE) The Xpert SA Assay (FDA approved for NASAL specimens in patients 57 years of age and older), is one component of a comprehensive surveillance program. It is not intended to diagnose infection nor to guide or monitor treatment. Performed at [facility], [address] [place], [place] [address]   Culture, blood (routine x 2)     Status: None (Preliminary result)   Collection Time: 03/06/18  6:30 PM  Result Value Ref Range Status   Specimen Description BLOOD RIGHT ARM  Final   Special Requests   Final    BOTTLES DRAWN AEROBIC AND ANAEROBIC Blood Culture adequate volume   Culture   Final    NO GROWTH 4 DAYS Performed at [facility], [place] [address], [place], [place] [address]    Report Status PENDING  Incomplete  Culture, blood (routine x 2)     Status: None (Preliminary result)   Collection Time: 03/06/18  6:37 PM  Result Value Ref Range Status   Specimen Description BLOOD RIGHT ARM  Final   Special Requests   Final    BOTTLES DRAWN AEROBIC AND ANAEROBIC Blood Culture adequate volume   Culture   Final    NO GROWTH 4 DAYS Performed at [facility], [place] [address] [place], [place] [address]    Report Status PENDING  Incomplete    Radiology Reports Ct Abdomen Pelvis Wo Contrast  Result Date: 02/12/2018 CLINICAL DATA:  Epigastric abdominal pain for the past week. EXAM: CT ABDOMEN AND PELVIS WITHOUT CONTRAST TECHNIQUE: Multidetector CT imaging of the abdomen and pelvis was performed following the standard protocol without IV contrast. COMPARISON:  CT abdomen pelvis dated February 19, 2010. FINDINGS: Lower chest: Confluent ground-glass density at the right greater than left lung bases with  inter and intralobular septal thickening, consistent with crazy paving pattern. This spares the subpleural lung. Hepatobiliary: No focal liver abnormality is seen. Status post cholecystectomy. Mild central intrahepatic and common bile duct dilatation, likely related to post cholecystectomy state. Pancreas: Unremarkable. No pancreatic ductal dilatation or surrounding inflammatory changes. Spleen: Normal in size without focal abnormality. Adrenals/Urinary Tract: Adrenal glands are unremarkable. Kidneys are normal, without renal calculi, focal lesion, or hydronephrosis. Bladder is unremarkable. Stomach/Bowel: Stomach is within normal limits. Appendix appears normal. No evidence of bowel wall thickening, distention, or inflammatory changes. Vascular/Lymphatic: Aortic atherosclerosis. No enlarged abdominal or pelvic lymph nodes. Reproductive: Prior hysterectomy. 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 Other: Tiny fat containing umbilical hernia. No free fluid or pneumoperitoneum. Musculoskeletal: No acute or significant osseous findings. 3 mm anterolisthesis at L4-L5 due to severe facet arthropathy. Which IMPRESSION: 1.  No acute intra-abdominal process. 2. Confluent ground-glass density and inter and intralobular septal thickening at the right greater than left lung bases, consistent with crazy paving pattern. While this pattern is nonspecific, given the sparing of the subpleural lung, this may be related to cryptogenic organizing pneumonia. Pulmonology consultation is recommended. 3. 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 Electronically Signed   By: Titus Dubin M.D.   On: 02/12/2018 20:01   Dg Chest 1 View  Result Date: 02/14/2018 CLINICAL DATA:  Shortness of breath EXAM: CHEST  1 VIEW COMPARISON:  02/12/2018 FINDINGS: Severe diffuse bilateral disease throughout the lungs, stable. Cardiomegaly. No visible effusions or acute bony abnormality. IMPRESSION: Severe bilateral airspace disease could reflect  edema/CHF or infection. No change. Electronically Signed   By: Rolm Baptise M.D.   On: 02/14/2018 09:38   US Renal  Result Date: 02/14/2018 CLINICAL DATA:  Acute kidney injury. EXAM: RENAL / URINARY TRACT ULTRASOUND COMPLETE COMPARISON:  CT abdomen pelvis dated February 12, 2018. FINDINGS: Right Kidney: Length: 11.0 cm. Increased echogenicity. No mass or hydronephrosis visualized. Left Kidney: Length: 10.9 cm. Increased echogenicity.  No mass or hydronephrosis visualized. Bladder: Decompressed by a Foley catheter. IMPRESSION: Increased renal cortical echogenicity bilaterally, consistent with medical renal disease. Electronically Signed   By: Titus Dubin M.D.   On: 02/14/2018 22:34   Ct Chest High Resolution  Result Date: 02/18/2018 CLINICAL DATA:  62 year old female with history of alcohol and cocaine abuse presenting with dyspnea and productive cough. Abnormal chest x-ray. EXAM: CT CHEST WITHOUT CONTRAST TECHNIQUE: Multidetector CT imaging of the chest was performed following the standard protocol without intravenous contrast. High resolution imaging of the lungs, as well as inspiratory and expiratory imaging, was performed. COMPARISON:  No prior chest CT.  Chest x-ray 02/14/2018. FINDINGS: Cardiovascular: Heart size is mildly enlarged. There is no significant pericardial fluid, thickening or pericardial calcification. Aortic atherosclerosis. No definite coronary artery calcifications. Mediastinum/Nodes: No pathologically enlarged mediastinal or hilar lymph nodes. Please note that accurate exclusion of hilar adenopathy is limited on noncontrast CT scans. Esophagus is unremarkable in appearance. No axillary lymphadenopathy. Lungs/Pleura: High-resolution images demonstrate widespread areas of ground-glass attenuation and septal thickening, as well as widespread areas of peripheral bronchiolectasis. Subpleural sparing is noted, particularly throughout the mid to lower lungs. These findings have no definitive  craniocaudal gradient, but appear slightly more pronounced throughout the mid to upper lungs than the lower lungs. No honeycombing. Inspiratory and expiratory imaging is unremarkable. Linear area of scarring or atelectasis in the periphery of the left lower lobe. Trace left pleural effusion lying dependently. Upper Abdomen: Unremarkable. Musculoskeletal: There are no aggressive appearing lytic or blastic lesions noted in the visualized portions of the skeleton. IMPRESSION: 1. The appearance of the lungs is nonspecific, but there are imaging findings that are suggestive of cryptogenic organizing pneumonia (COP). Other differential considerations include both cardiogenic and noncardiogenic edema, however, this is not strongly favored. Evaluation by Pulmonology is strongly recommended. 2. Aortic atherosclerosis. Aortic Atherosclerosis (ICD10-I70.0). Electronically Signed   By: Vinnie Langton M.D.   On: 02/18/2018 08:37   Ir Fluoro Guide Cv Line Right  Result Date: 02/26/2018 INDICATION: 49 year old with acute kidney injury and on dialysis. Patient currently has a non tunneled dialysis catheter and needs conversion to a tunneled dialysis catheter. EXAM: CONVERSION OF NON TUNNELED DIALYSIS CATHETER TO A TUNNELED DIALYSIS CATHETER WITH FLUOROSCOPY Physician: Stephan Minister. Anselm Pancoast, MD MEDICATIONS: Ancef 2 g; The antibiotic was administered within an appropriate time interval prior to skin puncture. ANESTHESIA/SEDATION: Versed 1.5 mg IV; Fentanyl 100 mcg IV; Moderate Sedation Time:  20 minutes The patient was continuously monitored during the procedure by the interventional radiology nurse under my direct supervision. FLUOROSCOPY TIME:  Fluoroscopy Time: 18 seconds, 1 mGy COMPLICATIONS: None immediate. PROCEDURE: The existing right jugular dialysis catheter was evaluated. This catheter was felt to be adequate for conversion to a tunneled catheter. The procedure was explained to the patient. The risks and benefits of the  procedure were discussed and the patient's questions were addressed. Informed consent was obtained from the patient. The existing catheter and the right upper chest were prepped and draped in a sterile fashion. Maximal barrier sterile technique was utilized including caps, mask, sterile gowns, sterile gloves, sterile drape, hand hygiene and skin antiseptic. The skin below the right clavicle and the skin around the catheter were anesthetized with 1% lidocaine. A small incision was made below the right clavicle. A subcutaneous tunnel was formed from the incision to the old catheter site. The old catheter was removed over a Amplatz wire. The wire was advanced into the IVC. A 19 cm tip to cuff  Palindrome catheter was pulled through the subcutaneous tunnel. Peel-away sheath was placed over the Amplatz wire. The catheter was placed through the peel-away sheath and positioned at the superior cavoatrial junction. Both lumens aspirated and flushed well. The catheter lumens were flushed with appropriate amount of heparin. The catheter was sutured to skin with Prolene suture. The old jugular right access site was closed using 2 interrupted Ethilon sutures. Fluoroscopic images were taken and saved for this procedure. FINDINGS: Catheter tip at the superior cavoatrial junction. IMPRESSION: Successful conversion of the right jugular catheter to a tunneled dialysis catheter. Right neck sutures can be removed in 10-14 days. Electronically Signed   By: Markus Daft M.D.   On: 02/26/2018 17:16   Ir Fluoro Guide Cv Line Right  Result Date: 02/18/2018 INDICATION: 62 year old female with renal failure in need of hemodialysis EXAM: IR RIGHT FLOURO GUIDE CV LINE; IR ULTRASOUND GUIDANCE VASC ACCESS RIGHT MEDICATIONS: None ANESTHESIA/SEDATION: None FLUOROSCOPY TIME:  Fluoroscopy Time: 0 minutes 6 seconds (0 mGy). COMPLICATIONS: None immediate. PROCEDURE: Informed written consent was obtained from the patient after a thorough discussion of  the procedural risks, benefits and alternatives. All questions were addressed. Maximal Sterile Barrier Technique was utilized including caps, mask, sterile gowns, sterile gloves, sterile drape, hand hygiene and skin antiseptic. A timeout was performed prior to the initiation of the procedure. The right internal jugular vein was interrogated with ultrasound and found to be widely patent. An image was obtained and stored for the medical record. Local anesthesia was attained by infiltration with 1% lidocaine. A small dermatotomy was made. Under real-time sonographic guidance, the vessel was punctured with a 21 gauge micropuncture needle. Using standard technique, the initial micro needle was exchanged over a 0.018 micro wire for a transitional 4 [place] micro sheath. The micro sheath was then exchanged over a 0.035 wire for a dilator which was used to dilate the soft tissue tract. A 20 cm 3 lumen non tunneled dialysis catheter was then advanced over the wire and position with the tip in the upper right atrium. The catheter flushed and aspirated with ease. The catheter was flushed with heparinized saline and secured to the skin with 0 Prolene suture. The patient tolerated the procedure well. IMPRESSION: Successful placement of a non tunneled Trialysis catheter via the right internal jugular vein. The catheter tip is in the upper right atrium and ready for immediate use. Signed, Criselda Peaches, MD [facility] Electronically Signed   By: Jacqulynn Cadet M.D.   On: 02/18/2018 16:08   Ir US Guide Vasc Access Right  Result Date: 02/18/2018 INDICATION: 62 year old female with renal failure in need of hemodialysis EXAM: IR RIGHT FLOURO GUIDE CV LINE; IR ULTRASOUND GUIDANCE VASC ACCESS RIGHT MEDICATIONS: None ANESTHESIA/SEDATION: None FLUOROSCOPY TIME:  Fluoroscopy Time: 0 minutes 6 seconds (0 mGy). COMPLICATIONS: None immediate. PROCEDURE: Informed written  consent was obtained from the patient after a thorough discussion of the procedural risks, benefits and alternatives. All questions were addressed. Maximal Sterile Barrier Technique was utilized including caps, mask, sterile gowns, sterile gloves, sterile drape, hand hygiene and skin antiseptic. A timeout was performed prior to the initiation of the procedure. The right internal jugular vein was interrogated with ultrasound and found to be widely patent. An image was obtained and stored for the medical record. Local anesthesia was attained by infiltration with 1% lidocaine. A small dermatotomy was made. Under real-time sonographic guidance, the vessel was punctured with a 21 gauge micropuncture needle. Using standard  technique, the initial micro needle was exchanged over a 0.018 micro wire for a transitional 4 [place] micro sheath. The micro sheath was then exchanged over a 0.035 wire for a dilator which was used to dilate the soft tissue tract. A 20 cm 3 lumen non tunneled dialysis catheter was then advanced over the wire and position with the tip in the upper right atrium. The catheter flushed and aspirated with ease. The catheter was flushed with heparinized saline and secured to the skin with 0 Prolene suture. The patient tolerated the procedure well. IMPRESSION: Successful placement of a non tunneled Trialysis catheter via the right internal jugular vein. The catheter tip is in the upper right atrium and ready for immediate use. Signed, Criselda Peaches, MD [facility] Radiology Electronically Signed   By: Jacqulynn Cadet M.D.   On: 02/18/2018 16:08   Dg Chest Port 1 View  Result Date: 03/06/2018 CLINICAL DATA:  Fever. EXAM: PORTABLE CHEST 1 VIEW COMPARISON:  CT 02/17/2018.  Chest x-ray 02/14/2018. FINDINGS: Right IJ dual-lumen catheter with tip over cavoatrial junction. Cardiomegaly. No focal infiltrate. No pleural effusion or pneumothorax. No acute bony  abnormality. J IMPRESSION: 1.  Right IJ dual-lumen catheter with tip over cavoatrial junction. 2. No acute cardiopulmonary disease. No evidence of pulmonary infiltrate. Electronically Signed   By: Marcello Moores  Register   On: 03/06/2018 13:47   Dg Chest Port 1 View  Result Date: 02/12/2018 CLINICAL DATA:  Hypoxia. EXAM: PORTABLE CHEST 1 VIEW COMPARISON:  Chest x-ray dated December 10, 2011. FINDINGS: The heart size and mediastinal contours are within normal limits. Normal pulmonary vascularity. Confluent airspace disease involving the right central lung and left perihilar lung. No pleural effusion or pneumothorax. No acute osseous abnormality. IMPRESSION: 1. Confluent airspace disease in the right greater than left lungs. Electronically Signed   By: Titus Dubin M.D.   On: 02/12/2018 18:53   US Biopsy (kidney)  Result Date: 02/17/2018 CLINICAL DATA:  Acute kidney injury and microscopic hematuria. Need for renal biopsy. EXAM: ULTRASOUND GUIDED CORE BIOPSY OF LEFT KIDNEY MEDICATIONS: 1.5 mg IV Versed; 25 mcg IV Fentanyl Total Moderate Sedation Time: 17 minutes. [name] level of consciousness and physiologic status were continuously monitored during the procedure by Radiology nursing. The patient was also given 20 mg of IV hydralazine at the beginning of the procedure and prior to needle advancement to treat hypertension. PROCEDURE: The procedure, risks, benefits, and alternatives were explained to the patient. Questions regarding the procedure were encouraged and answered. The patient understands and consents to the procedure. A time out was performed prior to initiating the procedure. Ultrasound was utilized to image both kidneys. The left flank region was prepped with chlorhexidine in a sterile fashion, and a sterile drape was applied covering the operative field. A sterile gown and sterile gloves were used for the procedure. Local anesthesia was provided with 1% Lidocaine. Core biopsy was performed at the  level of lower pole cortex of the left kidney with a 16 gauge needle device. Two separate core biopsy samples were obtained and submitted in saline. Additional ultrasound was performed. COMPLICATIONS: None. FINDINGS: Both kidneys were well visualized by ultrasound. The left kidney was chosen for biopsy. Solid tissue was obtained from the level of the lower pole cortex. Ultrasound shows no evidence of immediate bleeding complication. IMPRESSION: Ultrasound-guided core biopsy performed of the left kidney at the level of lower pole cortex. Electronically Signed   By: Aletta Edouard M.D.   On:  02/17/2018 13:07    Time Spent in minutes  30   Jani Gravel M.D on 03/11/2018 at 9:02 AM  Between 7am to 7pm - Pager [phone]    After 7pm go to [email or web address] - password [facility]  [facility] -  Office  [phone]

## 2018-03-11 NOTE — Progress Notes (Addendum)
Dr. Maudie Mercury paged regarding Hgb of 6.6 at 0740. Pt currently in HD. Dr Marval Regal ordered packed RBCs.  Riley Kill

## 2018-03-11 NOTE — Procedures (Signed)
I was present at this dialysis session. I have reviewed the session itself and made appropriate changes. I discussed the drop in Hgb and she is amenable to have blood transfusion with HD today.  Filed Weights   03/08/18 0803 03/08/18 1219 03/11/18 0730  Weight: 75.3 kg (166 lb 0.1 oz) 73.6 kg (162 lb 4.1 oz) 76.8 kg (169 lb 5 oz)    Recent Labs  Lab 03/11/18 0256  NA 133*  K 4.7  CL 98  CO2 25  GLUCOSE 180*  BUN 78*  CREATININE 6.00*  CALCIUM 7.8*  PHOS 3.4    Recent Labs  Lab 03/05/18 0328  03/09/18 0843 03/10/18 1713 03/11/18 0740  WBC 10.8*   < > 8.9 7.5 8.4  NEUTROABS 8.9*  --   --   --   --   HGB 7.1*   < > 7.4* 7.1* 6.6*  HCT 21.8*   < > 23.2* 22.1* 20.4*  MCV 80.4   < > 81.1 82.2 80.6  PLT 100*   < > 131* 136* 149*   < > = values in this interval not displayed.    Scheduled Meds: . sodium chloride   Intravenous Once  . Chlorhexidine Gluconate Cloth  6 each Topical Q0600  . cyclophosphamide  100 mg Oral Daily  . darbepoetin (ARANESP) injection - DIALYSIS  40 mcg Intravenous Q Tue-HD  . famotidine  20 mg Oral Daily  . insulin aspart  0-9 Units Subcutaneous TID WC  . insulin aspart  3 Units Subcutaneous TID WC  . insulin glargine  5 Units Subcutaneous QHS  . multivitamin with minerals  1 tablet Oral Daily  . polyethylene glycol  17 g Oral Daily  . predniSONE  60 mg Oral Q breakfast  . sodium chloride flush  3 mL Intravenous Q12H  . sulfamethoxazole-trimethoprim  1 tablet Oral Once per day on Mon Wed Fri  . thiamine  100 mg Oral Daily   Continuous Infusions: . sodium chloride 10 mL/hr at 03/05/18 1341  . 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  Will cont with cytoxan 100mg  daily and follow WBC at outpatient HD unit and bactrim PCP prophylaxis and cont with prednisone taper.  Pt to follow up with Dr. Carolin Sicks as an outpatient as well or can be managed at her HD unit once established 2. ESRD awaiting outpatient HD arrangements. 3. ABLA/Anemia of chronic disease: pt with significant drop in Hgb overnight and some swelling of her forearm.  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 Nutrition: renal diet 6. Hypertension: stable 7. Vascular access- s/p LUE AVF placed 03/05/18 by Dr. Donnetta Hutching s/p hematoma evacuation on 03/06/18 by Dr. Scot Dock.  Some swelling of left forearm, await VVS to evaluate. 8. Disposition- awaiting outpatient HD unit to be arranged.    Donetta Potts,  MD 03/11/2018, 8:40 AM

## 2018-03-12 DIAGNOSIS — N186 End stage renal disease: Secondary | ICD-10-CM

## 2018-03-12 LAB — RENAL FUNCTION PANEL
ANION GAP: 12 (ref 5–15)
Albumin: 3.1 g/dL — ABNORMAL LOW (ref 3.5–5.0)
BUN: 52 mg/dL — ABNORMAL HIGH (ref 8–23)
CALCIUM: 8 mg/dL — AB (ref 8.9–10.3)
CHLORIDE: 98 mmol/L (ref 98–111)
CO2: 25 mmol/L (ref 22–32)
Creatinine, Ser: 4.35 mg/dL — ABNORMAL HIGH (ref 0.44–1.00)
GFR calc Af Amer: 11 mL/min — ABNORMAL LOW (ref >60)
GFR calc non Af Amer: 10 mL/min — ABNORMAL LOW (ref >60)
GLUCOSE: 231 mg/dL — AB (ref 70–99)
Phosphorus: 2.9 mg/dL (ref 2.5–4.6)
Potassium: 4.2 mmol/L (ref 3.5–5.1)
SODIUM: 135 mmol/L (ref 135–145)

## 2018-03-12 LAB — BPAM RBC
Blood Product Expiration Date: 201907072359
Blood Product Expiration Date: 201907262359
ISSUE DATE / TIME: 201907021007
ISSUE DATE / TIME: 201907021007
UNIT TYPE AND RH: 600
Unit Type and Rh: 6200

## 2018-03-12 LAB — TYPE AND SCREEN
ABO/RH(D): A POS
Antibody Screen: NEGATIVE
Unit division: 0
Unit division: 0

## 2018-03-12 LAB — PARATHYROID HORMONE, INTACT (NO CA): PTH: 374 pg/mL — AB (ref 15–65)

## 2018-03-12 LAB — COMPREHENSIVE METABOLIC PANEL
ALBUMIN: 3.1 g/dL — AB (ref 3.5–5.0)
ALT: 15 U/L (ref 0–44)
ANION GAP: 9 (ref 5–15)
AST: 26 U/L (ref 15–41)
Alkaline Phosphatase: 57 U/L (ref 38–126)
BUN: 46 mg/dL — ABNORMAL HIGH (ref 8–23)
CALCIUM: 7.6 mg/dL — AB (ref 8.9–10.3)
CO2: 27 mmol/L (ref 22–32)
CREATININE: 4.09 mg/dL — AB (ref 0.44–1.00)
Chloride: 99 mmol/L (ref 98–111)
GFR calc Af Amer: 12 mL/min — ABNORMAL LOW (ref >60)
GFR calc non Af Amer: 11 mL/min — ABNORMAL LOW (ref >60)
GLUCOSE: 232 mg/dL — AB (ref 70–99)
Potassium: 4.4 mmol/L (ref 3.5–5.1)
SODIUM: 135 mmol/L (ref 135–145)
TOTAL PROTEIN: 4.8 g/dL — AB (ref 6.5–8.1)
Total Bilirubin: 0.8 mg/dL (ref 0.3–1.2)

## 2018-03-12 LAB — CBC
HEMATOCRIT: 26.8 % — AB (ref 36.0–46.0)
HEMATOCRIT: 29.1 % — AB (ref 36.0–46.0)
HEMOGLOBIN: 9.7 g/dL — AB (ref 12.0–15.0)
Hemoglobin: 9 g/dL — ABNORMAL LOW (ref 12.0–15.0)
MCH: 26.9 pg (ref 26.0–34.0)
MCH: 26.9 pg (ref 26.0–34.0)
MCHC: 33.6 g/dL (ref 30.0–36.0)
MCHC: 33.3 g/dL (ref 30.0–36.0)
MCV: 80.2 fL (ref 78.0–100.0)
MCV: 80.8 fL (ref 78.0–100.0)
Platelets: 112 10*3/uL — ABNORMAL LOW (ref 150–400)
Platelets: 115 10*3/uL — ABNORMAL LOW (ref 150–400)
RBC: 3.34 MIL/uL — ABNORMAL LOW (ref 3.87–5.11)
RBC: 3.6 MIL/uL — ABNORMAL LOW (ref 3.87–5.11)
RDW: 18.2 % — ABNORMAL HIGH (ref 11.5–15.5)
RDW: 18.5 % — ABNORMAL HIGH (ref 11.5–15.5)
WBC: 6.2 10*3/uL (ref 4.0–10.5)
WBC: 7.2 10*3/uL (ref 4.0–10.5)

## 2018-03-12 LAB — GLUCOSE, CAPILLARY
GLUCOSE-CAPILLARY: 178 mg/dL — AB (ref 70–99)
GLUCOSE-CAPILLARY: 236 mg/dL — AB (ref 70–99)
Glucose-Capillary: 150 mg/dL — ABNORMAL HIGH (ref 70–99)
Glucose-Capillary: 328 mg/dL — ABNORMAL HIGH (ref 70–99)

## 2018-03-12 MED ORDER — HEPARIN SODIUM (PORCINE) 1000 UNIT/ML DIALYSIS
20.0000 [IU]/kg | INTRAMUSCULAR | Status: DC | PRN
  Filled 2018-03-12: qty 2

## 2018-03-12 NOTE — Care Management Note (Addendum)
Case Management Note  Patient Details  Name: Rhonda Lynch MRN: [id number] Date of Birth: 08-04-1956  Subjective/Objective:  AKI with rapid progressive glomerulonephritis vasculitis, started on HD ( has had 4) and has had 5 therapeutic plasma exchanges, will need 7, a tunneled catheter was placed yesterday.  Plan is to finish receiving tpe and determine if she will need dialysis outpt.  6/25 Tomi Bamberger RN, BSN - she will need HHPT, will check with Butch Penny with [facility] to see if we can do charity HHPT.  Per Butch Penny with [facility] states she should qualify for charity HHPT, but they will not be able to see her until she has had her follow up apt on 7/16, because will need a MD to sign off orders.    6/27 Tomi Bamberger RN, BSN- fistula placed, patient with complications, bleeding under fistula, back to surgery this afternoon.    7/1 Tomi Bamberger RN, BSN - NCM spoke with Caryl Pina in HD , she states CLIP is Pending  7/3 CLIP pending. NCM gave patient Match Letter for medication assistance, she will have HHPT thru [facility].                     Action/Plan: DC home with HHPT when clip is processed for outpatient dialysis.  Expected Discharge Date:  02/17/18               Expected Discharge Plan:  [place]  In-House Referral:     Discharge planning Services  CM Consult, [facility], Follow-up appt scheduled, Medication Assistance  Post Acute Care Choice:    Choice offered to:     DME Arranged:    DME Agency:     HH Arranged:  PT HH Agency:  [facility]  Status of Service:  Completed, signed off  If discussed at [place] of Stay Meetings, dates discussed:    Additional Comments:  Zenon Mayo, RN 03/12/2018, 1:34 PM

## 2018-03-12 NOTE — Progress Notes (Signed)
Physical Therapy Treatment Patient Details Name: Rhonda Lynch MRN: [id number] DOB: Aug 30, 1956 Today's Date: 03/12/2018    History of Present Illness Pt is a 62 yo female admitted 02/12/18 with epigastric pain and tachypnea; found to have PNA, microctic anemia, renal failure, hypertensive urgency. RIJ tunneled HD cath placed. S/p L brachiocephalic AV graft placement 6/26; s/p evacuation of small LUE hematoma 6/27. PMH: cocaine and alcohol abuse.    PT Comments    Pt making progress towards her goals, however continues to be limited in safe mobility by instability and decreased endurance. Pt currently independent with bed mobility, supervision for sit>stand with pt need to grab bedrail to steady in standing. Pt ambulation of 500 feet with min guard for occasional scissoring with gait, pt able to catch herself. Pt ascend/descend 10 steps with 3/4 DoE requiring rest break before descending back down. Pt maintain SaO2 >95% and max HR of 110 bpm with stair training. D/c plans remain appropriate.    Follow Up Recommendations  Home health PT;Supervision/Assistance - 24 hour     Equipment Recommendations  None recommended by PT       Precautions / Restrictions Precautions Precautions: Fall Restrictions Weight Bearing Restrictions: No    Mobility  Bed Mobility Overal bed mobility: Independent                Transfers Overall transfer level: Needs assistance Equipment used: None Transfers: Sit to/from Stand Sit to Stand: Supervision         General transfer comment: Stood from bed, had to reach out to bedrail to steady herself   Ambulation/Gait Ambulation/Gait assistance: Min guard Gait Distance (Feet): 500 Feet Assistive device: None;1 person hand held assist Gait Pattern/deviations: Step-through pattern;Decreased stride length;Drifts right/left Gait velocity: Decreased Gait velocity interpretation: 1.31 - 2.62 ft/sec, indicative of limited community ambulator General  Gait Details: Slow, intermittently unsteady amb with min guard; able to steady herself without assist    Stairs Stairs: Yes Stairs assistance: Min guard Stair Management: One rail Right;Forwards;Step to pattern Number of Stairs: 10 General stair comments: Ascend/descended 10 steps with single UE support on rail and min guard; pt required 1x standing rest break at top of stairs due to fatigue       Balance Overall balance assessment: Needs assistance Sitting-balance support: No upper extremity supported;Feet unsupported Sitting balance-Leahy Scale: Good Sitting balance - Comments: Performing seated ADL task indep   Standing balance support: No upper extremity supported;During functional activity Standing balance-Leahy Scale: Good Standing balance comment: able to reach for brush outside BoS and brush her hair without UE support                            Cognition Arousal/Alertness: Awake/alert Behavior During Therapy: WFL for tasks assessed/performed Overall Cognitive Status: Within Functional Limits for tasks assessed                                           General Comments General comments (skin integrity, edema, etc.): VSS       Pertinent Vitals/Pain Pain Assessment: No/denies pain           PT Goals (current goals can now be found in the care plan section) Acute Rehab PT Goals PT Goal Formulation: With patient Time For Goal Achievement: 03/19/18 Potential to Achieve Goals: Good Progress towards PT goals: Progressing toward  goals    Frequency    Min 3X/week      PT Plan Current plan remains appropriate       AM-PAC PT "6 Clicks" Daily Activity  Outcome Measure  Difficulty turning over in bed (including adjusting bedclothes, sheets and blankets)?: None Difficulty moving from lying on back to sitting on the side of the bed? : None Difficulty sitting down on and standing up from a chair with arms (e.g., wheelchair, bedside  commode, etc,.)?: None Help needed moving to and from a bed to chair (including a wheelchair)?: None Help needed walking in hospital room?: A Little Help needed climbing 3-5 steps with a railing? : A Little 6 Click Score: 22    End of Session Equipment Utilized During Treatment: Gait belt Activity Tolerance: Patient tolerated treatment well Patient left: in bed;with call bell/phone within reach Nurse Communication: Mobility status PT Visit Diagnosis: Unsteadiness on feet (R26.81);Difficulty in walking, not elsewhere classified (R26.2)     Time: 4076-8088 PT Time Calculation (min) (ACUTE ONLY): 14 min  Charges:  $Gait Training: 8-22 mins                    G Codes:       Rhonda Lynch B. Rhonda Lynch PT, DPT Acute Rehabilitation  [phone] Pager [phone]     Rhonda Lynch 03/12/2018, 2:02 PM

## 2018-03-12 NOTE — Plan of Care (Signed)
Nutrition Education Note  RD consulted for Renal Education. Provided Renal Food Pyramid to patient/family. Reviewed food groups and provided written recommended serving sizes specifically determined for patient's current nutritional status.   Explained why diet restrictions are needed and provided lists of foods to limit/avoid that are high potassium, sodium, and phosphorus. Provided specific recommendations on safer alternatives of these foods. Strongly encouraged compliance of this diet.   Discussed importance of protein intake at each meal and snack. Provided examples of how to maximize protein intake throughout the day. Discussed need for fluid restriction with dialysis, importance of minimizing weight gain between HD treatments, and renal-friendly beverage options.  Encouraged pt to discuss specific diet questions/concerns with RD at HD outpatient facility. Teach back method used.  Pt reports having a great appetite PTA. She consumes a wide variety of food on a daily basis but mostly sticks to cheeses, meats, a lot of fruit, and some vegetables. Discussed how to modify and season meals based off of renal recommendations. Discouraged the use of alcohol.   Expect fair compliance.  Body mass index is 31.32 kg/m. Pt meets criteria for obese based on current BMI.  Current diet order is renal carb modified, patient is consuming approximately 100% of meals at this time. Labs and medications reviewed. No further nutrition interventions warranted at this time. RD contact information provided. If additional nutrition issues arise, please re-consult RD.  Mariana Single RD, LDN Clinical Nutrition Pager # [phone]

## 2018-03-12 NOTE — Progress Notes (Addendum)
S:No complaints O:BP (!) 170/80 (BP Location: Right Arm)   Pulse (!) 58   Temp 98.3 F (36.8 C) (Oral)   Resp 19   Ht 5\' 1"  (1.549 m)   Wt 75.2 kg (165 lb 12.6 oz)   SpO2 100%   BMI 31.32 kg/m   Intake/Output Summary (Last 24 hours) at 03/12/2018 1224 Last data filed at 03/12/2018 0800 Gross per 24 hour  Intake 1181.67 ml  Output -  Net 1181.67 ml   Intake/Output: I/O last 3 completed shifts: In: 1828.7 [P.O.:720; I.V.:464.7; Blood:644] Out: 1974 [Other:1974]  Intake/Output this shift:  Total I/O In: 240 [P.O.:240] Out: -  Weight change:  Gen: NAD CVS: no rub Resp: cta  DJT:TSVXBL Ext: LUE AVF +T/B some edema of forearm  Recent Labs  Lab 03/06/18 0312 03/07/18 0316 03/08/18 0231 03/09/18 0843 03/10/18 1713 03/11/18 0256 03/12/18 0344  NA 140 137 137 136 135 133* 135  K 4.7 4.3 4.7 3.9 4.7 4.7 4.4  CL 104 99 99 98 99 98 99  CO2 27 29 30 26 25 25 27   GLUCOSE 106* 214* 152* 216* 225* 180* 232*  BUN 61* 36* 61* 40* 64* 78* 46*  CREATININE 5.21* 3.52* 5.47* 3.74* 5.30* 6.00* 4.09*  ALBUMIN 3.2*  --   --   --  3.2* 3.1* 3.1*  CALCIUM 7.5* 7.3* 7.8* 7.9* 7.9* 7.8* 7.6*  PHOS  --   --   --   --  3.1 3.4  --   AST 33  --   --   --   --   --  26  ALT 18  --   --   --   --   --  15   Liver Function Tests: Recent Labs  Lab 03/06/18 0312 03/10/18 1713 03/11/18 0256 03/12/18 0344  AST 33  --   --  26  ALT 18  --   --  15  ALKPHOS 37*  --   --  57  BILITOT 0.4  --   --  0.8  PROT 4.1*  --   --  4.8*  ALBUMIN 3.2* 3.2* 3.1* 3.1*   No results for input(s): LIPASE, AMYLASE in the last 168 hours. No results for input(s): AMMONIA in the last 168 hours. CBC: Recent Labs  Lab 03/08/18 0231 03/09/18 0843 03/10/18 1713 03/11/18 0740 03/12/18 0344  WBC 8.7 8.9 7.5 8.4 6.2  HGB 7.0* 7.4* 7.1* 6.6* 9.0*  HCT 21.3* 23.2* 22.1* 20.4* 26.8*  MCV 80.4 81.1 82.2 80.6 80.2  PLT 84* 131* 136* 149* 112*   Cardiac Enzymes: No results for input(s): CKTOTAL, CKMB,  CKMBINDEX, TROPONINI in the last 168 hours. CBG: Recent Labs  Lab 03/11/18 1219 03/11/18 1657 03/11/18 2205 03/12/18 0722 03/12/18 1138  GLUCAP 152* 195* 190* 150* 178*    Iron Studies: No results for input(s): IRON, TIBC, TRANSFERRIN, FERRITIN in the last 72 hours. Studies/Results: No results found. . Chlorhexidine Gluconate Cloth  6 each Topical Q0600  . cyclophosphamide  100 mg Oral Daily  . darbepoetin (ARANESP) injection - DIALYSIS  40 mcg Intravenous Q Tue-HD  . famotidine  20 mg Oral Daily  . insulin aspart  0-9 Units Subcutaneous TID WC  . insulin aspart  3 Units Subcutaneous TID WC  . insulin glargine  5 Units Subcutaneous QHS  . multivitamin with minerals  1 tablet Oral Daily  . polyethylene glycol  17 g Oral Daily  . predniSONE  60 mg Oral Q breakfast  . sodium  chloride flush  3 mL Intravenous Q12H  . sulfamethoxazole-trimethoprim  1 tablet Oral Once per day on Mon Wed Fri  . thiamine  100 mg Oral Daily    BMET    Component Value Date/Time   NA 135 03/12/2018 0344   K 4.4 03/12/2018 0344   CL 99 03/12/2018 0344   CO2 27 03/12/2018 0344   GLUCOSE 232 (H) 03/12/2018 0344   BUN 46 (H) 03/12/2018 0344   CREATININE 4.09 (H) 03/12/2018 0344   CALCIUM 7.6 (L) 03/12/2018 0344   GFRNONAA 11 (L) 03/12/2018 0344   GFRAA 12 (L) 03/12/2018 0344   CBC    Component Value Date/Time   WBC 6.2 03/12/2018 0344   RBC 3.34 (L) 03/12/2018 0344   HGB 9.0 (L) 03/12/2018 0344   HCT 26.8 (L) 03/12/2018 0344   PLT 112 (L) 03/12/2018 0344   MCV 80.2 03/12/2018 0344   MCH 26.9 03/12/2018 0344   MCHC 33.6 03/12/2018 0344   RDW 18.2 (H) 03/12/2018 0344   LYMPHSABS 1.1 03/05/2018 0328   MONOABS 0.7 03/05/2018 0328   EOSABS 0.0 03/05/2018 0328   BASOSABS 0.0 03/05/2018 0328     Assessment/Plan:   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  1. Will cont with cytoxan 100mg  daily and follow WBC at outpatient HD unit and bactrim PCP  prophylaxis and cont with prednisone taper.  2. Pt to follow up with Dr. Carolin Sicks as an outpatient as well or can be managed at her HD unit once established 2. [place] [facility] outpatient HD arrangements. 3. ABLA/Anemia of chronic disease:pt with significant drop in Hgb overnight and some swelling of her forearm.  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 Nutrition:renal diet 6. Hypertension:stable 7. Thrombocytopenia- ? Related to cytoxan or possibly heparin, will send off HIT panel and follow 8. Vascular access- s/p LUE AVF placed 03/05/18 by Dr. Donnetta Hutching s/p hematoma evacuation on 03/06/18 by Dr. Scot Dock.  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 Donnetta Hutching. 1. rij TDC per nsg notes has been having "brown drainage" daily, however not able to express anything from tunnel exit site and is nontender.  Will ask nsg to send off for culture if occurs again at dressing change today. 9. Disposition- awaiting outpatient HD unit to be arranged.   Donetta Potts, MD [employer] [phone]

## 2018-03-12 NOTE — Progress Notes (Signed)
The pt's HD cath dressing has become saturated with brown drainage every day, requiring almost daily HD dressing changes. IV team came up to assess the site, no apparent problems besides the continuous drainage. This may be a barrier to d/c if pt is going to receive outpatient dialysis with no way to complete daily sterile dressing changes. Changing dressing daily is also increasing the risk for infection. Will continue to monitor site for any additional adverse events.

## 2018-03-12 NOTE — Progress Notes (Signed)
Patient ID: EVADEAN SPROULE, female   DOB: 06-May-1956, 62 y.o.   MRN: [id number]                                                                PROGRESS NOTE                                                                                                                                                                                                             Patient Demographics:    Rhonda Lynch, is a 62 y.o. female, DOB - May 19, 1956, WFU:[id number]  Admit date - 02/12/2018   Admitting Physician Vianne Bulls, MD  Outpatient Primary MD for the patient is Patient, No Pcp Per  LOS - 28  Outpatient Specialists:  Chief Complaint  Patient presents with  . Abdominal Pain       Brief Narrative   62 year old female with past medical history of cocaine/alcohol abuse who was admitted on 02/12/2018 with abdominal pain, nausea, vomiting. She was found to have Streptococcus pneumonia and also had acute kidney injury on presentation. Nephrology was consulted . 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 She has progressed to end-stage renal disease and is currently undergoing dialysis, plasmapheresis and has been started, on steroids and cyclophosphamide. Discharge planning after setting of outpatient dialysis facility. Received AV graft on 6/26/2019with hematoma which was subsequently evacuated.      Subjective:    Rhonda Lynch today has No headache, No chest pain, No abdominal pain - No Nausea, No new weakness tingling or numbness, No Cough - SOB.   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 -Status post left upper extremity aVF by Dr. Suzy Bouchard 6/26, status post hematoma evacuation 03/06/2018 by Dr. Doren Custard, still have some swelling of the left arm, vascular surgery follow-up appreciated, this may be related to venous outflow stenosis, can be followed as an outpatient. -Per nursing staff patient with some brown drainage from her chest permacath, to be sent for culture once available.  MPO ANCA Rapidly progressive, nephritis -Status post plasmapheresis, currently on Cytoxan 100 mg daily, and Bactrim BCP for prophylaxis, continue with prednisone taper  Diabetes mellitus -CBG acceptable on current dose Lantus and insulin sliding scale  Anemia -Anemia of chronic kidney disease, and acute blood loss anemia, -6.6 yesterday, transfused 2 units with good response, hemoglobin is 9 today -Procrit per renal   Pancytopenia.  - Trending down leukocytosis platelet count and anemia. Patient is on cyclophosphamide and Bactrim. Nephrology aware. Will need to closely monitor. Getting Aranesp.If there is worsening pancytopenia, consider hematology evaluation.  Diffuse alveolar hemorrhage versus bronchiolitis obliterans or cryptogenic organizing pneumonia as per the imaging. No active issues at this time. X-ray did not show any obvious findings on 6/27/2019and pulmonary followed the patient during hospitalization. Currently stable. 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   Sepsis secondary to streptococcal  pneumonia: Urine antigen was positive for streptococcus. Blood cultures have been negative. Completed the course of antibiotics. 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 Patient will need to follow-up with infectious disease as outpatient.   History of cocaine abuse: UDS was positive for cocaine on admission. Currently stable. No agitation or anxiety noted  Leukocytosis:WBC trending down. On cyclophosphamide. On steroids. Will need to closely monitor for downtrending WBC.      Code Status:FULL CODE  Family Communication :w patient  Disposition Plan:home once cleared by nephrology  Barriers For Discharge: CLIP  Consults :nephrology, vascular  Procedures : L AVF 6/26, and L AVF exploration 6/27  DVT Prophylaxis: Heparin on hold pending HIT - SCDs      RecentLabs       Lab Results  Component Value Date   PLT 131 (L) 03/09/2018      Antibiotics  :  Cefazolin 6/27      Anti-infectives (From admission, onward)   Start     Dose/Rate Route Frequency Ordered Stop   03/06/18 1425  ceFAZolin (ANCEF) 2-4 GM/100ML-% IVPB    Note to Pharmacy:  [facility]   : cabinet override      03/06/18 1425 03/07/18 0229   03/05/18 1415  ceFAZolin (ANCEF) IVPB 2g/100 mL premix     2 g 200 mL/hr over 30 Minutes Intravenous  Once 03/05/18 1401 03/05/18 1445   03/05/18 1403  ceFAZolin (ANCEF) 2-4 GM/100ML-% IVPB    Note to Pharmacy:  [facility]   : cabinet override      03/05/18 1403 03/05/18 1445   02/26/18 1615  ceFAZolin (ANCEF) IVPB 2g/100 mL premix  Status:  Discontinued     2 g 200 mL/hr over 30 Minutes Intravenous  Once 02/26/18 1608 03/04/18 1020   02/26/18 1556  ceFAZolin (ANCEF) 2-4 GM/100ML-% IVPB    Note to Pharmacy:  [facility]   : cabinet override      02/26/18 1556 02/26/18 1630   02/21/18 0900  sulfamethoxazole-trimethoprim (BACTRIM DS,SEPTRA DS)  800-160 MG per tablet 1 tablet     1  tablet Oral Once per day on Mon Wed Fri 02/20/18 1116     02/13/18 1900  cefTRIAXone (ROCEPHIN) 1 g in sodium chloride 0.9 % 100 mL IVPB     1 g 200 mL/hr over 30 Minutes Intravenous Every 24 hours 02/12/18 2122 02/18/18 2014   02/13/18 1900  azithromycin (ZITHROMAX) 500 mg in sodium chloride 0.9 % 250 mL IVPB  Status:  Discontinued     500 mg 250 mL/hr over 60 Minutes Intravenous Every 24 hours 02/12/18 2122 02/14/18 0819   02/12/18 1915  cefTRIAXone (ROCEPHIN) 2 g in sodium chloride 0.9 % 100 mL IVPB  Status:  Discontinued     2 g 200 mL/hr over 30 Minutes Intravenous Every 24 hours 02/12/18 1901 02/12/18 2125   02/12/18 1915  azithromycin (ZITHROMAX) 500 mg in sodium chloride 0.9 % 250 mL IVPB  Status:  Discontinued     500 mg 250 mL/hr over 60 Minutes Intravenous Every 24 hours 02/12/18 1901 02/12/18 2125        Objective:   Vitals:   03/11/18 1135 03/11/18 1655 03/12/18 0014 03/12/18 0723  BP: (!) 150/83 (!) 157/74 (!) 177/83 (!) 170/80  Pulse: 73 (!) 58 63 (!) 58  Resp: 16 17 (!) 24 19  Temp: 98.3 F (36.8 C) 98.5 F (36.9 C) 98.2 F (36.8 C) 98.3 F (36.8 C)  TempSrc: Oral Oral Oral Oral  SpO2: 98% 95% 99% 100%  Weight: 75.2 kg (165 lb 12.6 oz)     Height:        Wt Readings from Last 3 Encounters:  03/11/18 75.2 kg (165 lb 12.6 oz)     Intake/Output Summary (Last 24 hours) at 03/12/2018 1438 Last data filed at 03/12/2018 0800 Gross per 24 hour  Intake 483 ml  Output -  Net 483 ml     Physical Exam  Awake Alert, Oriented X 3, No new F.N deficits, Normal affect [place].AT,PERRAL Supple Neck,No JVD, No cervical lymphadenopathy appriciated.  Symmetrical Chest wall movement, Good air movement bilaterally, CTAB RRR,No Gallops,Rubs or new Murmurs, No Parasternal Heave +ve B.Sounds, Abd Soft, No tenderness, No rebound - guarding or rigidity. L aVF, slightly warm and swollen, has hemodialysis catheter in the right upper  chest.     Data Review:    CBC Recent Labs  Lab 03/09/18 0843 03/10/18 1713 03/11/18 0740 03/12/18 0344 03/12/18 1242  WBC 8.9 7.5 8.4 6.2 7.2  HGB 7.4* 7.1* 6.6* 9.0* 9.7*  HCT 23.2* 22.1* 20.4* 26.8* 29.1*  PLT 131* 136* 149* 112* 115*  MCV 81.1 82.2 80.6 80.2 80.8  MCH 25.9* 26.4 26.1 26.9 26.9  MCHC 31.9 32.1 32.4 33.6 33.3  RDW 21.1* 20.9* 20.8* 18.2* 18.5*    Chemistries  Recent Labs  Lab 03/06/18 0312  03/09/18 0843 03/10/18 1713 03/11/18 0256 03/12/18 0344 03/12/18 1247  NA 140   < > 136 135 133* 135 135  K 4.7   < > 3.9 4.7 4.7 4.4 4.2  CL 104   < > 98 99 98 99 98  CO2 27   < > 26 25 25 27 25   GLUCOSE 106*   < > 216* 225* 180* 232* 231*  BUN 61*   < > 40* 64* 78* 46* 52*  CREATININE 5.21*   < > 3.74* 5.30* 6.00* 4.09* 4.35*  CALCIUM 7.5*   < > 7.9* 7.9* 7.8* 7.6* 8.0*  AST 33  --   --   --   --  26  --  ALT 18  --   --   --   --  15  --   ALKPHOS 37*  --   --   --   --  57  --   BILITOT 0.4  --   --   --   --  0.8  --    < > = values in this interval not displayed.   ------------------------------------------------------------------------------------------------------------------ No results for input(s): CHOL, HDL, LDLCALC, TRIG, CHOLHDL, LDLDIRECT in the last 72 hours.  Lab Results  Component Value Date   HGBA1C 6.8 (H) 03/03/2018   ------------------------------------------------------------------------------------------------------------------ No results for input(s): TSH, T4TOTAL, T3FREE, THYROIDAB in the last 72 hours.  Invalid input(s): FREET3 ------------------------------------------------------------------------------------------------------------------ No results for input(s): VITAMINB12, FOLATE, FERRITIN, TIBC, IRON, RETICCTPCT in the last 72 hours.  Coagulation profile No results for input(s): INR, PROTIME in the last 168 hours.  No results for input(s): DDIMER in the last 72 hours.  Cardiac Enzymes No results for input(s):  CKMB, TROPONINI, MYOGLOBIN in the last 168 hours.  Invalid input(s): CK ------------------------------------------------------------------------------------------------------------------    Component Value Date/Time   BNP 225.7 (H) 02/12/2018 1534    Inpatient Medications  Scheduled Meds: . Chlorhexidine Gluconate Cloth  6 each Topical Q0600  . cyclophosphamide  100 mg Oral Daily  . darbepoetin (ARANESP) injection - DIALYSIS  40 mcg Intravenous Q Tue-HD  . famotidine  20 mg Oral Daily  . insulin aspart  0-9 Units Subcutaneous TID WC  . insulin aspart  3 Units Subcutaneous TID WC  . insulin glargine  5 Units Subcutaneous QHS  . multivitamin with minerals  1 tablet Oral Daily  . polyethylene glycol  17 g Oral Daily  . predniSONE  60 mg Oral Q breakfast  . sodium chloride flush  3 mL Intravenous Q12H  . sulfamethoxazole-trimethoprim  1 tablet Oral Once per day on Mon Wed Fri  . thiamine  100 mg Oral Daily   Continuous Infusions: . sodium chloride 10 mL/hr at 03/05/18 1341  . sodium chloride 10 mL/hr at 03/06/18 1429   PRN Meds:.alum & mag hydroxide-simeth, haloperidol lactate, heparin, hydrALAZINE, HYDROcodone-acetaminophen, HYDROmorphone (DILAUDID) injection, LORazepam, ondansetron **OR** ondansetron (ZOFRAN) IV, senna-docusate, zolpidem  Micro Results Recent Results (from the past 240 hour(s))  Surgical pcr screen     Status: None   Collection Time: 03/04/18  4:52 AM  Result Value Ref Range Status   MRSA, PCR NEGATIVE NEGATIVE Final   Staphylococcus aureus NEGATIVE NEGATIVE Final    Comment: (NOTE) The Xpert SA Assay (FDA approved for NASAL specimens in patients 18 years of age and older), is one component of a comprehensive surveillance program. It is not intended to diagnose infection nor to guide or monitor treatment. Performed at [facility], [place] [address], [place], [place] [address]   Culture, blood (routine x 2)     Status: None   Collection Time: 03/06/18   6:30 PM  Result Value Ref Range Status   Specimen Description BLOOD RIGHT ARM  Final   Special Requests   Final    BOTTLES DRAWN AEROBIC AND ANAEROBIC Blood Culture adequate volume   Culture   Final    NO GROWTH 5 DAYS Performed at [facility], [address], [place], [place] [address]    Report Status 03/11/2018 FINAL  Final  Culture, blood (routine x 2)     Status: None   Collection Time: 03/06/18  6:37 PM  Result Value Ref Range Status   Specimen Description BLOOD RIGHT ARM  Final   Special Requests   Final    BOTTLES DRAWN AEROBIC AND ANAEROBIC Blood Culture adequate volume   Culture   Final    NO GROWTH 5 DAYS Performed at [facility], [address], [place], [place] [address]    Report Status 03/11/2018 FINAL  Final    Radiology Reports Ct Abdomen Pelvis Wo Contrast  Result Date: 02/12/2018 CLINICAL DATA:  Epigastric abdominal pain for the past week. EXAM: CT ABDOMEN AND PELVIS WITHOUT CONTRAST TECHNIQUE: Multidetector CT imaging of the abdomen and pelvis was performed following the standard protocol without IV contrast. COMPARISON:  CT abdomen pelvis dated February 19, 2010. FINDINGS: Lower chest: Confluent ground-glass density at the right greater than left lung bases with inter and intralobular septal thickening, consistent with crazy paving pattern. This spares the subpleural lung. Hepatobiliary: No focal liver abnormality is seen. Status post cholecystectomy. Mild central intrahepatic and common bile duct dilatation, likely related to post cholecystectomy state. Pancreas: Unremarkable. No pancreatic ductal dilatation or surrounding inflammatory changes. Spleen: Normal in size without focal abnormality. Adrenals/Urinary Tract: Adrenal glands are unremarkable. Kidneys are normal, without renal calculi, focal lesion, or hydronephrosis. Bladder is unremarkable. Stomach/Bowel: Stomach is within normal limits. Appendix appears normal. No evidence of bowel wall thickening,  distention, or inflammatory changes. Vascular/Lymphatic: Aortic atherosclerosis. No enlarged abdominal or pelvic lymph nodes. Reproductive: Prior hysterectomy. 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 Other: Tiny fat containing umbilical hernia. No free fluid or pneumoperitoneum. Musculoskeletal: No acute or significant osseous findings. 3 mm anterolisthesis at L4-L5 due to severe facet arthropathy. Which IMPRESSION: 1.  No acute intra-abdominal process. 2. Confluent ground-glass density and inter and intralobular septal thickening at the right greater than left lung bases, consistent with crazy paving pattern. While this pattern is nonspecific, given the sparing of the subpleural lung, this may be related to cryptogenic organizing pneumonia. Pulmonology consultation is recommended. 3. 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 Electronically Signed   By: Titus Dubin M.D.   On: 02/12/2018 20:01   Dg Chest 1 View  Result Date: 02/14/2018 CLINICAL DATA:  Shortness of breath EXAM: CHEST  1 VIEW COMPARISON:  02/12/2018 FINDINGS: Severe diffuse bilateral disease throughout the lungs, stable. Cardiomegaly. No visible effusions or acute bony abnormality. IMPRESSION: Severe bilateral airspace disease could reflect edema/CHF or infection. No change. Electronically Signed   By: Rolm Baptise M.D.   On: 02/14/2018 09:38   US Renal  Result Date: 02/14/2018 CLINICAL DATA:  Acute kidney injury. EXAM: RENAL / URINARY TRACT ULTRASOUND COMPLETE COMPARISON:  CT abdomen pelvis dated February 12, 2018. FINDINGS: Right Kidney: Length: 11.0 cm. Increased echogenicity. No mass or hydronephrosis visualized. Left Kidney: Length: 10.9 cm. Increased echogenicity. No mass or hydronephrosis visualized. Bladder: Decompressed by a Foley catheter. IMPRESSION: Increased renal cortical echogenicity bilaterally, consistent with medical renal disease. Electronically Signed   By: Titus Dubin M.D.   On: 02/14/2018 22:34   Ct Chest High  Resolution  Result Date: 02/18/2018 CLINICAL DATA:  62 year old female with history of alcohol and cocaine abuse presenting with dyspnea and productive cough. Abnormal chest x-ray. EXAM: CT CHEST WITHOUT CONTRAST TECHNIQUE: Multidetector CT imaging of the chest was performed following the standard protocol without intravenous contrast. High resolution imaging of the lungs, as well as inspiratory and expiratory imaging, was performed. COMPARISON:  No prior chest CT.  Chest x-ray 02/14/2018. FINDINGS: Cardiovascular: Heart size is mildly enlarged. There is no significant pericardial fluid, thickening or pericardial calcification. Aortic atherosclerosis.  No definite coronary artery calcifications. Mediastinum/Nodes: No pathologically enlarged mediastinal or hilar lymph nodes. Please note that accurate exclusion of hilar adenopathy is limited on noncontrast CT scans. Esophagus is unremarkable in appearance. No axillary lymphadenopathy. Lungs/Pleura: High-resolution images demonstrate widespread areas of ground-glass attenuation and septal thickening, as well as widespread areas of peripheral bronchiolectasis. Subpleural sparing is noted, particularly throughout the mid to lower lungs. These findings have no definitive craniocaudal gradient, but appear slightly more pronounced throughout the mid to upper lungs than the lower lungs. No honeycombing. Inspiratory and expiratory imaging is unremarkable. Linear area of scarring or atelectasis in the periphery of the left lower lobe. Trace left pleural effusion lying dependently. Upper Abdomen: Unremarkable. Musculoskeletal: There are no aggressive appearing lytic or blastic lesions noted in the visualized portions of the skeleton. IMPRESSION: 1. The appearance of the lungs is nonspecific, but there are imaging findings that are suggestive of cryptogenic organizing pneumonia (COP). Other differential considerations include both cardiogenic and noncardiogenic edema, however,  this is not strongly favored. Evaluation by Pulmonology is strongly recommended. 2. Aortic atherosclerosis. Aortic Atherosclerosis (ICD10-I70.0). Electronically Signed   By: Vinnie Langton M.D.   On: 02/18/2018 08:37   Ir Fluoro Guide Cv Line Right  Result Date: 02/26/2018 INDICATION: 32 year old with acute kidney injury and on dialysis. Patient currently has a non tunneled dialysis catheter and needs conversion to a tunneled dialysis catheter. EXAM: CONVERSION OF NON TUNNELED DIALYSIS CATHETER TO A TUNNELED DIALYSIS CATHETER WITH FLUOROSCOPY Physician: Stephan Minister. Anselm Pancoast, MD MEDICATIONS: Ancef 2 g; The antibiotic was administered within an appropriate time interval prior to skin puncture. ANESTHESIA/SEDATION: Versed 1.5 mg IV; Fentanyl 100 mcg IV; Moderate Sedation Time:  20 minutes The patient was continuously monitored during the procedure by the interventional radiology nurse under my direct supervision. FLUOROSCOPY TIME:  Fluoroscopy Time: 18 seconds, 1 mGy COMPLICATIONS: None immediate. PROCEDURE: The existing right jugular dialysis catheter was evaluated. This catheter was felt to be adequate for conversion to a tunneled catheter. The procedure was explained to the patient. The risks and benefits of the procedure were discussed and the patient's questions were addressed. Informed consent was obtained from the patient. The existing catheter and the right upper chest were prepped and draped in a sterile fashion. Maximal barrier sterile technique was utilized including caps, mask, sterile gowns, sterile gloves, sterile drape, hand hygiene and skin antiseptic. The skin below the right clavicle and the skin around the catheter were anesthetized with 1% lidocaine. A small incision was made below the right clavicle. A subcutaneous tunnel was formed from the incision to the old catheter site. The old catheter was removed over a Amplatz wire. The wire was advanced into the IVC. A 19 cm tip to cuff Palindrome catheter  was pulled through the subcutaneous tunnel. Peel-away sheath was placed over the Amplatz wire. The catheter was placed through the peel-away sheath and positioned at the superior cavoatrial junction. Both lumens aspirated and flushed well. The catheter lumens were flushed with appropriate amount of heparin. The catheter was sutured to skin with Prolene suture. The old jugular right access site was closed using 2 interrupted Ethilon sutures. Fluoroscopic images were taken and saved for this procedure. FINDINGS: Catheter tip at the superior cavoatrial junction. IMPRESSION: Successful conversion of the right jugular catheter to a tunneled dialysis catheter. Right neck sutures can be removed in 10-14 days. Electronically Signed   By: Markus Daft M.D.   On: 02/26/2018 17:16   Ir Fluoro Guide Cv Line Right  Result  Date: 02/18/2018 INDICATION: 62 year old female with renal failure in need of hemodialysis EXAM: IR RIGHT FLOURO GUIDE CV LINE; IR ULTRASOUND GUIDANCE VASC ACCESS RIGHT MEDICATIONS: None ANESTHESIA/SEDATION: None FLUOROSCOPY TIME:  Fluoroscopy Time: 0 minutes 6 seconds (0 mGy). COMPLICATIONS: None immediate. PROCEDURE: Informed written consent was obtained from the patient after a thorough discussion of the procedural risks, benefits and alternatives. All questions were addressed. Maximal Sterile Barrier Technique was utilized including caps, mask, sterile gowns, sterile gloves, sterile drape, hand hygiene and skin antiseptic. A timeout was performed prior to the initiation of the procedure. The right internal jugular vein was interrogated with ultrasound and found to be widely patent. An image was obtained and stored for the medical record. Local anesthesia was attained by infiltration with 1% lidocaine. A small dermatotomy was made. Under real-time sonographic guidance, the vessel was punctured with a 21 gauge micropuncture needle. Using standard technique, the initial micro needle was exchanged over a  0.018 micro wire for a transitional 4 [place] micro sheath. The micro sheath was then exchanged over a 0.035 wire for a dilator which was used to dilate the soft tissue tract. A 20 cm 3 lumen non tunneled dialysis catheter was then advanced over the wire and position with the tip in the upper right atrium. The catheter flushed and aspirated with ease. The catheter was flushed with heparinized saline and secured to the skin with 0 Prolene suture. The patient tolerated the procedure well. IMPRESSION: Successful placement of a non tunneled Trialysis catheter via the right internal jugular vein. The catheter tip is in the upper right atrium and ready for immediate use. Signed, Criselda Peaches, MD [facility] Electronically Signed   By: Jacqulynn Cadet M.D.   On: 02/18/2018 16:08   Ir US Guide Vasc Access Right  Result Date: 02/18/2018 INDICATION: 62 year old female with renal failure in need of hemodialysis EXAM: IR RIGHT FLOURO GUIDE CV LINE; IR ULTRASOUND GUIDANCE VASC ACCESS RIGHT MEDICATIONS: None ANESTHESIA/SEDATION: None FLUOROSCOPY TIME:  Fluoroscopy Time: 0 minutes 6 seconds (0 mGy). COMPLICATIONS: None immediate. PROCEDURE: Informed written consent was obtained from the patient after a thorough discussion of the procedural risks, benefits and alternatives. All questions were addressed. Maximal Sterile Barrier Technique was utilized including caps, mask, sterile gowns, sterile gloves, sterile drape, hand hygiene and skin antiseptic. A timeout was performed prior to the initiation of the procedure. The right internal jugular vein was interrogated with ultrasound and found to be widely patent. An image was obtained and stored for the medical record. Local anesthesia was attained by infiltration with 1% lidocaine. A small dermatotomy was made. Under real-time sonographic guidance, the vessel was punctured with a 21 gauge micropuncture needle. Using  standard technique, the initial micro needle was exchanged over a 0.018 micro wire for a transitional 4 [place] micro sheath. The micro sheath was then exchanged over a 0.035 wire for a dilator which was used to dilate the soft tissue tract. A 20 cm 3 lumen non tunneled dialysis catheter was then advanced over the wire and position with the tip in the upper right atrium. The catheter flushed and aspirated with ease. The catheter was flushed with heparinized saline and secured to the skin with 0 Prolene suture. The patient tolerated the procedure well. IMPRESSION: Successful placement of a non tunneled Trialysis catheter via the right internal jugular vein. The catheter tip is in the upper right atrium and ready for immediate use. Signed, Criselda Peaches, MD [facility] Radiology Electronically Signed   By: Jacqulynn Cadet M.D.   On: 02/18/2018 16:08   Dg Chest Port 1 View  Result Date: 03/06/2018 CLINICAL DATA:  Fever. EXAM: PORTABLE CHEST 1 VIEW COMPARISON:  CT 02/17/2018.  Chest x-ray 02/14/2018. FINDINGS: Right IJ dual-lumen catheter with tip over cavoatrial junction. Cardiomegaly. No focal infiltrate. No pleural effusion or pneumothorax. No acute bony abnormality. J IMPRESSION: 1.  Right IJ dual-lumen catheter with tip over cavoatrial junction. 2. No acute cardiopulmonary disease. No evidence of pulmonary infiltrate. Electronically Signed   By: Marcello Moores  Register   On: 03/06/2018 13:47   Dg Chest Port 1 View  Result Date: 02/12/2018 CLINICAL DATA:  Hypoxia. EXAM: PORTABLE CHEST 1 VIEW COMPARISON:  Chest x-ray dated December 10, 2011. FINDINGS: The heart size and mediastinal contours are within normal limits. Normal pulmonary vascularity. Confluent airspace disease involving the right central lung and left perihilar lung. No pleural effusion or pneumothorax. No acute osseous abnormality. IMPRESSION: 1. Confluent airspace disease in the right greater than left  lungs. Electronically Signed   By: Titus Dubin M.D.   On: 02/12/2018 18:53   US Biopsy (kidney)  Result Date: 02/17/2018 CLINICAL DATA:  Acute kidney injury and microscopic hematuria. Need for renal biopsy. EXAM: ULTRASOUND GUIDED CORE BIOPSY OF LEFT KIDNEY MEDICATIONS: 1.5 mg IV Versed; 25 mcg IV Fentanyl Total Moderate Sedation Time: 17 minutes. [name] level of consciousness and physiologic status were continuously monitored during the procedure by Radiology nursing. The patient was also given 20 mg of IV hydralazine at the beginning of the procedure and prior to needle advancement to treat hypertension. PROCEDURE: The procedure, risks, benefits, and alternatives were explained to the patient. Questions regarding the procedure were encouraged and answered. The patient understands and consents to the procedure. A time out was performed prior to initiating the procedure. Ultrasound was utilized to image both kidneys. The left flank region was prepped with chlorhexidine in a sterile fashion, and a sterile drape was applied covering the operative field. A sterile gown and sterile gloves were used for the procedure. Local anesthesia was provided with 1% Lidocaine. Core biopsy was performed at the level of lower pole cortex of the left kidney with a 16 gauge needle device. Two separate core biopsy samples were obtained and submitted in saline. Additional ultrasound was performed. COMPLICATIONS: None. FINDINGS: Both kidneys were well visualized by ultrasound. The left kidney was chosen for biopsy. Solid tissue was obtained from the level of the lower pole cortex. Ultrasound shows no evidence of immediate bleeding complication. IMPRESSION: Ultrasound-guided core biopsy performed of the left kidney at the level of lower pole cortex. Electronically Signed   By: Aletta Edouard M.D.   On: 02/17/2018 13:07    Phillips Climes M.D on 03/12/2018 at 2:38 PM  Between 7am to 7pm - Pager - [phone]  After  7pm go to [email or web address] - password [facility]  [facility] -  Office  [phone]

## 2018-03-12 NOTE — Progress Notes (Signed)
Pt noted to have brown drainage coming from HD cath. Per Dr Waldron Labs, if drainage is noted again please send it for gram stain and culture. Currently, HD cath and dressing is clean, dy, and intact  Rhonda Lynch

## 2018-03-13 LAB — RENAL FUNCTION PANEL
ALBUMIN: 3 g/dL — AB (ref 3.5–5.0)
ANION GAP: 8 (ref 5–15)
BUN: 67 mg/dL — ABNORMAL HIGH (ref 8–23)
CO2: 28 mmol/L (ref 22–32)
Calcium: 8.2 mg/dL — ABNORMAL LOW (ref 8.9–10.3)
Chloride: 100 mmol/L (ref 98–111)
Creatinine, Ser: 5.01 mg/dL — ABNORMAL HIGH (ref 0.44–1.00)
GFR, EST AFRICAN AMERICAN: 10 mL/min — AB (ref >60)
GFR, EST NON AFRICAN AMERICAN: 8 mL/min — AB (ref >60)
GLUCOSE: 108 mg/dL — AB (ref 70–99)
PHOSPHORUS: 3.8 mg/dL (ref 2.5–4.6)
Potassium: 4.1 mmol/L (ref 3.5–5.1)
Sodium: 136 mmol/L (ref 135–145)

## 2018-03-13 LAB — CBC
HEMATOCRIT: 26.4 % — AB (ref 36.0–46.0)
HEMOGLOBIN: 8.7 g/dL — AB (ref 12.0–15.0)
MCH: 26.7 pg (ref 26.0–34.0)
MCHC: 33 g/dL (ref 30.0–36.0)
MCV: 81 fL (ref 78.0–100.0)
Platelets: 121 10*3/uL — ABNORMAL LOW (ref 150–400)
RBC: 3.26 MIL/uL — AB (ref 3.87–5.11)
RDW: 18.5 % — ABNORMAL HIGH (ref 11.5–15.5)
WBC: 6.2 10*3/uL (ref 4.0–10.5)

## 2018-03-13 LAB — GLUCOSE, CAPILLARY
GLUCOSE-CAPILLARY: 135 mg/dL — AB (ref 70–99)
GLUCOSE-CAPILLARY: 138 mg/dL — AB (ref 70–99)
GLUCOSE-CAPILLARY: 60 mg/dL — AB (ref 70–99)
Glucose-Capillary: 96 mg/dL (ref 70–99)

## 2018-03-13 LAB — HEPARIN INDUCED PLATELET AB (HIT ANTIBODY): Heparin Induced Plt Ab: 0.077 OD (ref 0.000–0.400)

## 2018-03-13 MED ORDER — INSULIN GLARGINE 100 UNIT/ML ~~LOC~~ SOLN
8.0000 [IU] | Freq: Every day | SUBCUTANEOUS | Status: DC
  Administered 2018-03-13: 8 [IU] via SUBCUTANEOUS
  Filled 2018-03-13: qty 0.08

## 2018-03-13 MED ORDER — DARBEPOETIN ALFA 40 MCG/0.4ML IJ SOSY
PREFILLED_SYRINGE | INTRAMUSCULAR | Status: AC
  Filled 2018-03-13: qty 0.4

## 2018-03-13 MED ORDER — DARBEPOETIN ALFA 40 MCG/0.4ML IJ SOSY
40.0000 ug | PREFILLED_SYRINGE | INTRAMUSCULAR | Status: DC
  Administered 2018-03-13: 40 ug via INTRAVENOUS
  Filled 2018-03-13: qty 0.4

## 2018-03-13 NOTE — Progress Notes (Signed)
[name], female   DOB: January 07, 1956, 62 y.o.   MRN: [id number]                                                                PROGRESS NOTE                                                                                                                                                                                                             Patient Demographics:    Rhonda Lynch, is a 62 y.o. female, DOB - [birth year], VQQ:[id number]  Admit date - 02/12/2018   Admitting Physician Vianne Bulls, MD  Outpatient Primary MD for the patient is Patient, No Pcp Per  LOS - 29  Outpatient Specialists:  Chief Complaint  Patient presents with  . Abdominal Pain       Brief Narrative   62 year old female with past medical history of cocaine/alcohol abuse who was admitted on 02/12/2018 with abdominal pain, nausea, vomiting. She was found to have Streptococcus pneumonia and also had acute kidney injury on presentation. Nephrology was consulted . 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 She has progressed to end-stage renal disease and is currently undergoing dialysis, plasmapheresis and has been started, on steroids and cyclophosphamide. Discharge planning after setting of outpatient dialysis facility. Received AV graft on 6/26/2019with hematoma which was subsequently evacuated.      Subjective:    Rhonda Lynch today has No headache, No chest pain, No abdominal pain - No Nausea, No new weakness tingling or numbness, No Cough - SOB.   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 -Status post left upper extremity aVF by Dr. Ma Rings 6/26, status post hematoma evacuation 03/06/2018 by Dr. Doren Custard, still have some swelling of the left arm, vascular surgery follow-up appreciated, this may be related to venous outflow stenosis, can be followed as an outpatient. -Per nursing staff patient with some brown drainage from her chest permacath, to be sent for culture once available.  I have discussed with nursing staff, there is no drainage noticed for last 24 hours.  MPO ANCA Rapidly progressive, nephritis -Status post plasmapheresis, currently on Cytoxan 100 mg daily, and Bactrim BCP for prophylaxis, continue with prednisone taper  Diabetes mellitus -CBG is very labile, significantly elevated last evening, so I have increased evening dose Lantus to 8 units, continue with insulin sliding scale and 3 units NovoLog before meals as well .  Anemia -Anemia of chronic kidney disease, and acute blood loss anemia, -6.6 yesterday, transfused 2 units with good response, and stable at 8.7 today -Procrit per renal   Pancytopenia.  - Trending down leukocytosis platelet count and anemia. Patient is on cyclophosphamide and Bactrim. Nephrology aware. Will need to closely monitor. Getting Aranesp.If there is worsening pancytopenia, consider hematology evaluation.  Diffuse alveolar hemorrhage versus bronchiolitis obliterans or cryptogenic organizing pneumonia as per the imaging. No active issues at this time.  X-ray did not show any obvious findings on 6/27/2019and pulmonary followed the patient during hospitalization. Currently stable. 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   Sepsis secondary to streptococcal pneumonia: Urine antigen was positive for streptococcus. Blood cultures have been negative. Completed the course of antibiotics. 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 Patient will need to follow-up with infectious disease as outpatient.   History of cocaine abuse: UDS was positive for cocaine on admission. Currently stable. No agitation or anxiety noted  Leukocytosis:WBC trending down. On cyclophosphamide. On steroids. Will need to closely monitor for downtrending WBC.      Code Status:FULL CODE  Family Communication :w patient  Disposition Plan:home once cleared by nephrology  Barriers For Discharge: CLIP  Consults :nephrology, vascular  Procedures : L AVF 6/26, and L AVF exploration 6/27  DVT Prophylaxis: Heparin on hold pending HIT - SCDs      RecentLabs       Lab Results  Component Value Date   PLT 131 (L) 03/09/2018      Antibiotics  :  Cefazolin 6/27      Anti-infectives (From admission, onward)   Start     Dose/Rate Route Frequency Ordered Stop   03/06/18 1425  ceFAZolin (ANCEF) 2-4 GM/100ML-% IVPB    Note to Pharmacy:  [facility]   : cabinet override      03/06/18 1425 03/07/18 0229   03/05/18 1415  ceFAZolin (ANCEF) IVPB 2g/100 mL premix     2 g 200 mL/hr over 30 Minutes Intravenous  Once 03/05/18 1401 03/05/18 1445   03/05/18 1403  ceFAZolin (ANCEF) 2-4 GM/100ML-% IVPB    Note to Pharmacy:  [facility]   : cabinet override      03/05/18 1403 03/05/18 1445   02/26/18 1615  ceFAZolin (ANCEF) IVPB 2g/100 mL premix  Status:  Discontinued     2 g 200 mL/hr over 30 Minutes Intravenous  Once 02/26/18 1608 03/04/18 1020    02/26/18 1556  ceFAZolin (ANCEF) 2-4 GM/100ML-% IVPB    Note  to Pharmacy:  [facility]   : cabinet override      02/26/18 1556 02/26/18 1630   02/21/18 0900  sulfamethoxazole-trimethoprim (BACTRIM DS,SEPTRA DS) 800-160 MG per tablet 1 tablet     1 tablet Oral Once per day on Mon Wed Fri 02/20/18 1116     02/13/18 1900  cefTRIAXone (ROCEPHIN) 1 g in sodium chloride 0.9 % 100 mL IVPB     1 g 200 mL/hr over 30 Minutes Intravenous Every 24 hours 02/12/18 2122 02/18/18 2014   02/13/18 1900  azithromycin (ZITHROMAX) 500 mg in sodium chloride 0.9 % 250 mL IVPB  Status:  Discontinued     500 mg 250 mL/hr over 60 Minutes Intravenous Every 24 hours 02/12/18 2122 02/14/18 0819   02/12/18 1915  cefTRIAXone (ROCEPHIN) 2 g in sodium chloride 0.9 % 100 mL IVPB  Status:  Discontinued     2 g 200 mL/hr over 30 Minutes Intravenous Every 24 hours 02/12/18 1901 02/12/18 2125   02/12/18 1915  azithromycin (ZITHROMAX) 500 mg in sodium chloride 0.9 % 250 mL IVPB  Status:  Discontinued     500 mg 250 mL/hr over 60 Minutes Intravenous Every 24 hours 02/12/18 1901 02/12/18 2125        Objective:   Vitals:   03/13/18 0830 03/13/18 0900 03/13/18 0930 03/13/18 1000  BP: 140/71 140/66 131/65 (!) 106/41  Pulse: 67 (!) 54 (!) 59 (!) 58  Resp: 19 14 14    Temp:      TempSrc:      SpO2:      Weight:      Height:        Wt Readings from Last 3 Encounters:  03/13/18 76.1 kg (167 lb 12.3 oz)     Intake/Output Summary (Last 24 hours) at 03/13/2018 1037 Last data filed at 03/12/2018 2201 Gross per 24 hour  Intake 243 ml  Output -  Net 243 ml     Physical Exam  Awake Alert, Oriented X 3, No new F.N deficits, Normal affect Symmetrical Chest wall movement, Good air movement bilaterally, CTAB RRR,No Gallops,Rubs or new Murmurs, No Parasternal Heave +ve B.Sounds, Abd Soft, No tenderness, No organomegaly appriciated, No rebound - guarding or rigidity. Left aVF with some swelling, slightly warm, but appears  to be  improved from yesterday, right upper chest hemodialysis catheter with no discharge or secretions.      Data Review:    CBC Recent Labs  Lab 03/10/18 1713 03/11/18 0740 03/12/18 0344 03/12/18 1242 03/13/18 0757  WBC 7.5 8.4 6.2 7.2 6.2  HGB 7.1* 6.6* 9.0* 9.7* 8.7*  HCT 22.1* 20.4* 26.8* 29.1* 26.4*  PLT 136* 149* 112* 115* 121*  MCV 82.2 80.6 80.2 80.8 81.0  MCH 26.4 26.1 26.9 26.9 26.7  MCHC 32.1 32.4 33.6 33.3 33.0  RDW 20.9* 20.8* 18.2* 18.5* 18.5*    Chemistries  Recent Labs  Lab 03/10/18 1713 03/11/18 0256 03/12/18 0344 03/12/18 1247 03/13/18 0757  NA 135 133* 135 135 136  K 4.7 4.7 4.4 4.2 4.1  CL 99 98 99 98 100  CO2 25 25 27 25 28   GLUCOSE 225* 180* 232* 231* 108*  BUN 64* 78* 46* 52* 67*  CREATININE 5.30* 6.00* 4.09* 4.35* 5.01*  CALCIUM 7.9* 7.8* 7.6* 8.0* 8.2*  AST  --   --  26  --   --   ALT  --   --  15  --   --   ALKPHOS  --   --  57  --   --   BILITOT  --   --  0.8  --   --    ------------------------------------------------------------------------------------------------------------------ No results for input(s): CHOL, HDL, LDLCALC, TRIG, CHOLHDL, LDLDIRECT in the last 72 hours.  Lab Results  Component Value Date   HGBA1C 6.8 (H) 03/03/2018   ------------------------------------------------------------------------------------------------------------------ No results for input(s): TSH, T4TOTAL, T3FREE, THYROIDAB in the last 72 hours.  Invalid input(s): FREET3 ------------------------------------------------------------------------------------------------------------------ No results for input(s): VITAMINB12, FOLATE, FERRITIN, TIBC, IRON, RETICCTPCT in the last 72 hours.  Coagulation profile No results for input(s): INR, PROTIME in the last 168 hours.  No results for input(s): DDIMER in the last 72 hours.  Cardiac Enzymes No results for input(s): CKMB, TROPONINI, MYOGLOBIN in the last 168 hours.  Invalid input(s):  CK ------------------------------------------------------------------------------------------------------------------    Component Value Date/Time   BNP 225.7 (H) 02/12/2018 1534    Inpatient Medications  Scheduled Meds: . Chlorhexidine Gluconate Cloth  6 each Topical Q0600  . cyclophosphamide  100 mg Oral Daily  . darbepoetin (ARANESP) injection - DIALYSIS  40 mcg Intravenous Q Thu-HD  . famotidine  20 mg Oral Daily  . insulin aspart  0-9 Units Subcutaneous TID WC  . insulin aspart  3 Units Subcutaneous TID WC  . insulin glargine  5 Units Subcutaneous QHS  . multivitamin with minerals  1 tablet Oral Daily  . polyethylene glycol  17 g Oral Daily  . predniSONE  60 mg Oral Q breakfast  . sodium chloride flush  3 mL Intravenous Q12H  . sulfamethoxazole-trimethoprim  1 tablet Oral Once per day on Mon Wed Fri  . thiamine  100 mg Oral Daily   Continuous Infusions: . sodium chloride 10 mL/hr at 03/05/18 1341  . sodium chloride 10 mL/hr at 03/06/18 1429   PRN Meds:.alum & mag hydroxide-simeth, haloperidol lactate, heparin, hydrALAZINE, HYDROcodone-acetaminophen, HYDROmorphone (DILAUDID) injection, LORazepam, ondansetron **OR** ondansetron (ZOFRAN) IV, senna-docusate, zolpidem  Micro Results Recent Results (from the past 240 hour(s))  Surgical pcr screen     Status: None   Collection Time: 03/04/18  4:52 AM  Result Value Ref Range Status   MRSA, PCR NEGATIVE NEGATIVE Final   Staphylococcus aureus NEGATIVE NEGATIVE Final    Comment: (NOTE) The Xpert SA Assay (FDA approved for NASAL specimens in patients 64 years of age and older), is one component of a comprehensive surveillance program. It is not intended to diagnose infection nor to guide or monitor treatment. Performed at [facility], [place] [address], [place], [place] [address]   Culture, blood (routine x 2)     Status: None   Collection Time: 03/06/18  6:30 PM  Result Value Ref Range Status   Specimen Description BLOOD  RIGHT ARM  Final   Special Requests   Final    BOTTLES DRAWN AEROBIC AND ANAEROBIC Blood Culture adequate volume   Culture   Final    NO GROWTH 5 DAYS Performed at [facility], [address] [place], [place] [address]    Report Status 03/11/2018 FINAL  Final  Culture, blood (routine x 2)     Status: None   Collection Time: 03/06/18  6:37 PM  Result Value Ref Range Status   Specimen Description BLOOD RIGHT ARM  Final   Special Requests   Final    BOTTLES DRAWN AEROBIC AND ANAEROBIC Blood Culture adequate volume   Culture   Final    NO GROWTH 5 DAYS Performed at [facility], [place],  [place] [address]    Report Status 03/11/2018 FINAL  Final    Radiology Reports Ct Abdomen Pelvis Wo Contrast  Result Date: 02/12/2018 CLINICAL DATA:  Epigastric abdominal pain for the past week. EXAM: CT ABDOMEN AND PELVIS WITHOUT CONTRAST TECHNIQUE: Multidetector CT imaging of the abdomen and pelvis was performed following the standard protocol without IV contrast. COMPARISON:  CT abdomen pelvis dated February 19, 2010. FINDINGS: Lower chest: Confluent ground-glass density at the right greater than left lung bases with inter and intralobular septal thickening, consistent with crazy paving pattern. This spares the subpleural lung. Hepatobiliary: No focal liver abnormality is seen. Status post cholecystectomy. Mild central intrahepatic and common bile duct dilatation, likely related to post cholecystectomy state. Pancreas: Unremarkable. No pancreatic ductal dilatation or surrounding inflammatory changes. Spleen: Normal in size without focal abnormality. Adrenals/Urinary Tract: Adrenal glands are unremarkable. Kidneys are normal, without renal calculi, focal lesion, or hydronephrosis. Bladder is unremarkable. Stomach/Bowel: Stomach is within normal limits. Appendix appears normal. No evidence of bowel wall thickening, distention, or inflammatory changes. Vascular/Lymphatic: Aortic  atherosclerosis. No enlarged abdominal or pelvic lymph nodes. Reproductive: Prior hysterectomy. 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 Other: Tiny fat containing umbilical hernia. No free fluid or pneumoperitoneum. Musculoskeletal: No acute or significant osseous findings. 3 mm anterolisthesis at L4-L5 due to severe facet arthropathy. Which IMPRESSION: 1.  No acute intra-abdominal process. 2. Confluent ground-glass density and inter and intralobular septal thickening at the right greater than left lung bases, consistent with crazy paving pattern. While this pattern is nonspecific, given the sparing of the subpleural lung, this may be related to cryptogenic organizing pneumonia. Pulmonology consultation is recommended. 3. 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 Electronically Signed   By: Titus Dubin M.D.   On: 02/12/2018 20:01   Dg Chest 1 View  Result Date: 02/14/2018 CLINICAL DATA:  Shortness of breath EXAM: CHEST  1 VIEW COMPARISON:  02/12/2018 FINDINGS: Severe diffuse bilateral disease throughout the lungs, stable. Cardiomegaly. No visible effusions or acute bony abnormality. IMPRESSION: Severe bilateral airspace disease could reflect edema/CHF or infection. No change. Electronically Signed   By: Rolm Baptise M.D.   On: 02/14/2018 09:38   US Renal  Result Date: 02/14/2018 CLINICAL DATA:  Acute kidney injury. EXAM: RENAL / URINARY TRACT ULTRASOUND COMPLETE COMPARISON:  CT abdomen pelvis dated February 12, 2018. FINDINGS: Right Kidney: Length: 11.0 cm. Increased echogenicity. No mass or hydronephrosis visualized. Left Kidney: Length: 10.9 cm. Increased echogenicity. No mass or hydronephrosis visualized. Bladder: Decompressed by a Foley catheter. IMPRESSION: Increased renal cortical echogenicity bilaterally, consistent with medical renal disease. Electronically Signed   By: Titus Dubin M.D.   On: 02/14/2018 22:34   Ct Chest High Resolution  Result Date: 02/18/2018 CLINICAL DATA:  62 year old  female with history of alcohol and cocaine abuse presenting with dyspnea and productive cough. Abnormal chest x-ray. EXAM: CT CHEST WITHOUT CONTRAST TECHNIQUE: Multidetector CT imaging of the chest was performed following the standard protocol without intravenous contrast. High resolution imaging of the lungs, as well as inspiratory and expiratory imaging, was performed. COMPARISON:  No prior chest CT.  Chest x-ray 02/14/2018. FINDINGS: Cardiovascular: Heart size is mildly enlarged. There is no significant pericardial fluid, thickening or pericardial calcification. Aortic atherosclerosis. No definite coronary artery calcifications. Mediastinum/Nodes: No pathologically enlarged mediastinal or hilar lymph nodes. Please note that accurate exclusion of hilar adenopathy is limited on noncontrast CT scans. Esophagus is unremarkable in appearance. No axillary lymphadenopathy. Lungs/Pleura: High-resolution images demonstrate widespread areas of ground-glass  attenuation and septal thickening, as well as widespread areas of peripheral bronchiolectasis. Subpleural sparing is noted, particularly throughout the mid to lower lungs. These findings have no definitive craniocaudal gradient, but appear slightly more pronounced throughout the mid to upper lungs than the lower lungs. No honeycombing. Inspiratory and expiratory imaging is unremarkable. Linear area of scarring or atelectasis in the periphery of the left lower lobe. Trace left pleural effusion lying dependently. Upper Abdomen: Unremarkable. Musculoskeletal: There are no aggressive appearing lytic or blastic lesions noted in the visualized portions of the skeleton. IMPRESSION: 1. The appearance of the lungs is nonspecific, but there are imaging findings that are suggestive of cryptogenic organizing pneumonia (COP). Other differential considerations include both cardiogenic and noncardiogenic edema, however, this is not strongly favored. Evaluation by Pulmonology is  strongly recommended. 2. Aortic atherosclerosis. Aortic Atherosclerosis (ICD10-I70.0). Electronically Signed   By: Vinnie Langton M.D.   On: 02/18/2018 08:37   Ir Fluoro Guide Cv Line Right  Result Date: 02/26/2018 INDICATION: 31 year old with acute kidney injury and on dialysis. Patient currently has a non tunneled dialysis catheter and needs conversion to a tunneled dialysis catheter. EXAM: CONVERSION OF NON TUNNELED DIALYSIS CATHETER TO A TUNNELED DIALYSIS CATHETER WITH FLUOROSCOPY Physician: Stephan Minister. Anselm Pancoast, MD MEDICATIONS: Ancef 2 g; The antibiotic was administered within an appropriate time interval prior to skin puncture. ANESTHESIA/SEDATION: Versed 1.5 mg IV; Fentanyl 100 mcg IV; Moderate Sedation Time:  20 minutes The patient was continuously monitored during the procedure by the interventional radiology nurse under my direct supervision. FLUOROSCOPY TIME:  Fluoroscopy Time: 18 seconds, 1 mGy COMPLICATIONS: None immediate. PROCEDURE: The existing right jugular dialysis catheter was evaluated. This catheter was felt to be adequate for conversion to a tunneled catheter. The procedure was explained to the patient. The risks and benefits of the procedure were discussed and the patient's questions were addressed. Informed consent was obtained from the patient. The existing catheter and the right upper chest were prepped and draped in a sterile fashion. Maximal barrier sterile technique was utilized including caps, mask, sterile gowns, sterile gloves, sterile drape, hand hygiene and skin antiseptic. The skin below the right clavicle and the skin around the catheter were anesthetized with 1% lidocaine. A small incision was made below the right clavicle. A subcutaneous tunnel was formed from the incision to the old catheter site. The old catheter was removed over a Amplatz wire. The wire was advanced into the IVC. A 19 cm tip to cuff Palindrome catheter was pulled through the subcutaneous tunnel. Peel-away  sheath was placed over the Amplatz wire. The catheter was placed through the peel-away sheath and positioned at the superior cavoatrial junction. Both lumens aspirated and flushed well. The catheter lumens were flushed with appropriate amount of heparin. The catheter was sutured to skin with Prolene suture. The old jugular right access site was closed using 2 interrupted Ethilon sutures. Fluoroscopic images were taken and saved for this procedure. FINDINGS: Catheter tip at the superior cavoatrial junction. IMPRESSION: Successful conversion of the right jugular catheter to a tunneled dialysis catheter. Right neck sutures can be removed in 10-14 days. Electronically Signed   By: Markus Daft M.D.   On: 02/26/2018 17:16   Ir Fluoro Guide Cv Line Right  Result Date: 02/18/2018 INDICATION: 62 year old female with renal failure in need of hemodialysis EXAM: IR RIGHT FLOURO GUIDE CV LINE; IR ULTRASOUND GUIDANCE VASC ACCESS RIGHT MEDICATIONS: None ANESTHESIA/SEDATION: None FLUOROSCOPY TIME:  Fluoroscopy Time: 0 minutes 6 seconds (0 mGy). COMPLICATIONS: None immediate. PROCEDURE:  Informed written consent was obtained from the patient after a thorough discussion of the procedural risks, benefits and alternatives. All questions were addressed. Maximal Sterile Barrier Technique was utilized including caps, mask, sterile gowns, sterile gloves, sterile drape, hand hygiene and skin antiseptic. A timeout was performed prior to the initiation of the procedure. The right internal jugular vein was interrogated with ultrasound and found to be widely patent. An image was obtained and stored for the medical record. Local anesthesia was attained by infiltration with 1% lidocaine. A small dermatotomy was made. Under real-time sonographic guidance, the vessel was punctured with a 21 gauge micropuncture needle. Using standard technique, the initial micro needle was exchanged over a 0.018 micro wire for a transitional 4 [place] micro sheath.  The micro sheath was then exchanged over a 0.035 wire for a dilator which was used to dilate the soft tissue tract. A 20 cm 3 lumen non tunneled dialysis catheter was then advanced over the wire and position with the tip in the upper right atrium. The catheter flushed and aspirated with ease. The catheter was flushed with heparinized saline and secured to the skin with 0 Prolene suture. The patient tolerated the procedure well. IMPRESSION: Successful placement of a non tunneled Trialysis catheter via the right internal jugular vein. The catheter tip is in the upper right atrium and ready for immediate use. Signed, Criselda Peaches, MD [facility] Radiology Electronically Signed   By: Jacqulynn Cadet M.D.   On: 02/18/2018 16:08   Ir US Guide Vasc Access Right  Result Date: 02/18/2018 INDICATION: 62 year old female with renal failure in need of hemodialysis EXAM: IR RIGHT FLOURO GUIDE CV LINE; IR ULTRASOUND GUIDANCE VASC ACCESS RIGHT MEDICATIONS: None ANESTHESIA/SEDATION: None FLUOROSCOPY TIME:  Fluoroscopy Time: 0 minutes 6 seconds (0 mGy). COMPLICATIONS: None immediate. PROCEDURE: Informed written consent was obtained from the patient after a thorough discussion of the procedural risks, benefits and alternatives. All questions were addressed. Maximal Sterile Barrier Technique was utilized including caps, mask, sterile gowns, sterile gloves, sterile drape, hand hygiene and skin antiseptic. A timeout was performed prior to the initiation of the procedure. The right internal jugular vein was interrogated with ultrasound and found to be widely patent. An image was obtained and stored for the medical record. Local anesthesia was attained by infiltration with 1% lidocaine. A small dermatotomy was made. Under real-time sonographic guidance, the vessel was punctured with a 21 gauge micropuncture needle. Using standard technique, the initial micro needle was exchanged  over a 0.018 micro wire for a transitional 4 [place] micro sheath. The micro sheath was then exchanged over a 0.035 wire for a dilator which was used to dilate the soft tissue tract. A 20 cm 3 lumen non tunneled dialysis catheter was then advanced over the wire and position with the tip in the upper right atrium. The catheter flushed and aspirated with ease. The catheter was flushed with heparinized saline and secured to the skin with 0 Prolene suture. The patient tolerated the procedure well. IMPRESSION: Successful placement of a non tunneled Trialysis catheter via the right internal jugular vein. The catheter tip is in the upper right atrium and ready for immediate use. Signed, Criselda Peaches, MD [facility] Radiology Electronically Signed   By: Jacqulynn Cadet M.D.   On: 02/18/2018 16:08   Dg Chest Port 1 View  Result Date: 03/06/2018 CLINICAL DATA:  Fever. EXAM: PORTABLE CHEST 1 VIEW COMPARISON:  CT 02/17/2018.  Chest  x-ray 02/14/2018. FINDINGS: Right IJ dual-lumen catheter with tip over cavoatrial junction. Cardiomegaly. No focal infiltrate. No pleural effusion or pneumothorax. No acute bony abnormality. J IMPRESSION: 1.  Right IJ dual-lumen catheter with tip over cavoatrial junction. 2. No acute cardiopulmonary disease. No evidence of pulmonary infiltrate. Electronically Signed   By: Marcello Moores  Register   On: 03/06/2018 13:47   Dg Chest Port 1 View  Result Date: 02/12/2018 CLINICAL DATA:  Hypoxia. EXAM: PORTABLE CHEST 1 VIEW COMPARISON:  Chest x-ray dated December 10, 2011. FINDINGS: The heart size and mediastinal contours are within normal limits. Normal pulmonary vascularity. Confluent airspace disease involving the right central lung and left perihilar lung. No pleural effusion or pneumothorax. No acute osseous abnormality. IMPRESSION: 1. Confluent airspace disease in the right greater than left lungs. Electronically Signed   By: Titus Dubin M.D.    On: 02/12/2018 18:53   US Biopsy (kidney)  Result Date: 02/17/2018 CLINICAL DATA:  Acute kidney injury and microscopic hematuria. Need for renal biopsy. EXAM: ULTRASOUND GUIDED CORE BIOPSY OF LEFT KIDNEY MEDICATIONS: 1.5 mg IV Versed; 25 mcg IV Fentanyl Total Moderate Sedation Time: 17 minutes. [name] level of consciousness and physiologic status were continuously monitored during the procedure by Radiology nursing. The patient was also given 20 mg of IV hydralazine at the beginning of the procedure and prior to needle advancement to treat hypertension. PROCEDURE: The procedure, risks, benefits, and alternatives were explained to the patient. Questions regarding the procedure were encouraged and answered. The patient understands and consents to the procedure. A time out was performed prior to initiating the procedure. Ultrasound was utilized to image both kidneys. The left flank region was prepped with chlorhexidine in a sterile fashion, and a sterile drape was applied covering the operative field. A sterile gown and sterile gloves were used for the procedure. Local anesthesia was provided with 1% Lidocaine. Core biopsy was performed at the level of lower pole cortex of the left kidney with a 16 gauge needle device. Two separate core biopsy samples were obtained and submitted in saline. Additional ultrasound was performed. COMPLICATIONS: None. FINDINGS: Both kidneys were well visualized by ultrasound. The left kidney was chosen for biopsy. Solid tissue was obtained from the level of the lower pole cortex. Ultrasound shows no evidence of immediate bleeding complication. IMPRESSION: Ultrasound-guided core biopsy performed of the left kidney at the level of lower pole cortex. Electronically Signed   By: Aletta Edouard M.D.   On: 02/17/2018 13:07    Phillips Climes M.D on 03/13/2018 at 10:37 AM  Between 7am to 7pm - Pager - [phone]  After 7pm go to [email or web address] - password [facility]  [facility] -  Office  [phone]

## 2018-03-13 NOTE — Progress Notes (Signed)
Notified by NT that CBG at 2145 was 60.  Patient was sleeping and wakened, drank 4 ounces of Cranberry juice, declined soda. Patient came up to 96 at the 15 minute check.  She ambulated to bathroom and after getting back into bed,  ate some graham crackers and peanut butter.  She states she did not eat much for dinner.

## 2018-03-13 NOTE — Progress Notes (Signed)
Physical Therapy Treatment Patient Details Name: Rhonda Lynch MRN: [id number] DOB: 07-02-1956 Today's Date: 03/13/2018    History of Present Illness Pt is a 62 yo female admitted 02/12/18 with epigastric pain and tachypnea; found to have PNA, microctic anemia, renal failure, hypertensive urgency. RIJ tunneled HD cath placed. S/p L brachiocephalic AV graft placement 6/26; s/p evacuation of small LUE hematoma 6/27. PMH: cocaine and alcohol abuse.   PT Comments    Pt has progressed well with mobility. Ambulating throughout room and performing higher level balance tasks at supervision-level. Performed 5x sit-to-stands with no UE support in 11.38sec; age norm is 12 sec, and <14sec indicates decreased risk for falls. Pt is motivated to return home. Reports no questions or concerns. Educ on energy conservation, therex and fall risk reduction. Continue to recommend HHPT services to maximize functional mobility and independence. Will have necessary support from family. D/c acute PT.   Follow Up Recommendations  Home health PT;Supervision/Assistance - 24 hour     Equipment Recommendations  None recommended by PT    Recommendations for Other Services       Precautions / Restrictions Precautions Precautions: Fall Restrictions Weight Bearing Restrictions: No    Mobility  Bed Mobility                  Transfers Overall transfer level: Independent Equipment used: None Transfers: Sit to/from [employer] transfer comment: Performed 5x repeated sit<>stands for time with no UE support in 11.38sec (age group norm is 77sec)  Ambulation/Gait Ambulation/Gait assistance: Supervision   Assistive device: None Gait Pattern/deviations: Step-through pattern;Decreased stride length         Stairs             Wheelchair Mobility    Modified Rankin (Stroke Patients Only)       Balance Overall balance assessment: Needs assistance Sitting-balance support: No  upper extremity supported;Feet unsupported Sitting balance-Leahy Scale: Normal     Standing balance support: No upper extremity supported;During functional activity Standing balance-Leahy Scale: Good               High level balance activites: Side stepping;Backward walking;Direction changes;Turns High Level Balance Comments: Able to reach outside BOS, pick object off floro and other higher level balance activities             Cognition Arousal/Alertness: Awake/alert Behavior During Therapy: WFL for tasks assessed/performed Overall Cognitive Status: Within Functional Limits for tasks assessed                                        Exercises Other Exercises Other Exercises: Repeated sit<>stands, partial squats, LAQ, marching    General Comments        Pertinent Vitals/Pain Pain Assessment: No/denies pain    Home Living                      Prior Function            PT Goals (current goals can now be found in the care plan section)      Frequency    Min 3X/week      PT Plan Current plan remains appropriate    Co-evaluation              AM-PAC PT "6 Clicks" Daily Activity  Outcome Measure  Difficulty turning over  in bed (including adjusting bedclothes, sheets and blankets)?: None Difficulty moving from lying on back to sitting on the side of the bed? : None Difficulty sitting down on and standing up from a chair with arms (e.g., wheelchair, bedside commode, etc,.)?: None Help needed moving to and from a bed to chair (including a wheelchair)?: None Help needed walking in hospital room?: None Help needed climbing 3-5 steps with a railing? : A Little 6 Click Score: 23    End of Session Equipment Utilized During Treatment: Gait belt Activity Tolerance: Patient tolerated treatment well Patient left: in bed;with call bell/phone within reach;with family/visitor present Nurse Communication: Mobility status PT Visit  Diagnosis: Unsteadiness on feet (R26.81);Difficulty in walking, not elsewhere classified (R26.2)     Time: 7867-5449 PT Time Calculation (min) (ACUTE ONLY): 10 min  Charges:  $Therapeutic Exercise: 8-22 mins                    G Codes:      Mabeline Caras, PT, DPT Acute Rehab Services  Pager: [place] 03/13/2018, 3:05 PM

## 2018-03-13 NOTE — Progress Notes (Signed)
PT Cancellation Note  Patient Details Name: Rhonda Lynch MRN: [id number] DOB: [birth year]   Cancelled Treatment:    Reason Eval/Treat Not Completed: Patient at procedure or test/unavailable (HD). Will follow-up for PT treatment as schedule permits.  Mabeline Caras, PT, DPT Acute Rehab Services  Pager: [place] 03/13/2018, 8:49 AM

## 2018-03-13 NOTE — Procedures (Signed)
I was present at this dialysis session. I have reviewed the session itself and made appropriate changes.   Filed Weights   03/11/18 0730 03/11/18 1135 03/13/18 0621  Weight: 76.8 kg (169 lb 5 oz) 75.2 kg (165 lb 12.6 oz) 76.9 kg (169 lb 8.5 oz)    Recent Labs  Lab 03/12/18 1247  NA 135  K 4.2  CL 98  CO2 25  GLUCOSE 231*  BUN 52*  CREATININE 4.35*  CALCIUM 8.0*  PHOS 2.9    Recent Labs  Lab 03/11/18 0740 03/12/18 0344 03/12/18 1242  WBC 8.4 6.2 7.2  HGB 6.6* 9.0* 9.7*  HCT 20.4* 26.8* 29.1*  MCV 80.6 80.2 80.8  PLT 149* 112* 115*    Scheduled Meds: . Chlorhexidine Gluconate Cloth  6 each Topical Q0600  . cyclophosphamide  100 mg Oral Daily  . darbepoetin (ARANESP) injection - DIALYSIS  40 mcg Intravenous Q Thu-HD  . famotidine  20 mg Oral Daily  . insulin aspart  0-9 Units Subcutaneous TID WC  . insulin aspart  3 Units Subcutaneous TID WC  . insulin glargine  5 Units Subcutaneous QHS  . multivitamin with minerals  1 tablet Oral Daily  . polyethylene glycol  17 g Oral Daily  . predniSONE  60 mg Oral Q breakfast  . sodium chloride flush  3 mL Intravenous Q12H  . sulfamethoxazole-trimethoprim  1 tablet Oral Once per day on Mon Wed Fri  . thiamine  100 mg Oral Daily   Continuous Infusions: . sodium chloride 10 mL/hr at 03/05/18 1341  . 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  1. Will cont with cytoxan 100mg  daily and follow WBC at outpatient HD unit and bactrim PCP prophylaxis and cont with prednisone taper. Will decrease to 50mg  and continue slow taper. 2. Pt to follow up with Dr. Carolin Sicks as an outpatient as well or can be managed  at her HD unit once established 2. [facility] outpatient HD arrangements. 3. ABLA/Anemiaof chronic disease:pt with significant drop in Hgb and transfused 2 uPRBC's and cont with ESA. Hgb stable. 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 Nutrition:renal diet 6. Hypertension:stable 7. Thrombocytopenia- ? Related to cytoxan or possibly heparin, will send off HIT panel and follow 8. Vascular access- s/p LUE AVF placed 03/05/18 by Dr. Donnetta Hutching s/p hematoma evacuation on 03/06/18 by Dr. 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 Donnetta Hutching. 1. rij TDC per nsg notes has been having "brown drainage" daily, however not able to express anything from tunnel exit site yesterday morning and none overnight.  Exit site is nontender.  Will ask nsg to send off for culture if occurs again at dressing change. 9. Disposition- awaiting outpatient HD unit to be arranged.   Rhonda Potts,  MD 03/13/2018, 7:56 AM

## 2018-03-14 ENCOUNTER — Inpatient Hospital Stay (HOSPITAL_COMMUNITY): Payer: Medicaid Other

## 2018-03-14 LAB — CBC
HCT: 30.4 % — ABNORMAL LOW (ref 36.0–46.0)
Hemoglobin: 10 g/dL — ABNORMAL LOW (ref 12.0–15.0)
MCH: 27 pg (ref 26.0–34.0)
MCHC: 32.9 g/dL (ref 30.0–36.0)
MCV: 81.9 fL (ref 78.0–100.0)
Platelets: 122 10*3/uL — ABNORMAL LOW (ref 150–400)
RBC: 3.71 MIL/uL — ABNORMAL LOW (ref 3.87–5.11)
RDW: 18.5 % — AB (ref 11.5–15.5)
WBC: 4.2 10*3/uL (ref 4.0–10.5)

## 2018-03-14 LAB — C DIFFICILE QUICK SCREEN W PCR REFLEX
C DIFFICLE (CDIFF) ANTIGEN: NEGATIVE
C Diff interpretation: NOT DETECTED
C Diff toxin: NEGATIVE

## 2018-03-14 LAB — GLUCOSE, CAPILLARY
GLUCOSE-CAPILLARY: 100 mg/dL — AB (ref 70–99)
GLUCOSE-CAPILLARY: 100 mg/dL — AB (ref 70–99)
Glucose-Capillary: 83 mg/dL (ref 70–99)
Glucose-Capillary: 162 mg/dL — ABNORMAL HIGH (ref 70–99)
Glucose-Capillary: 265 mg/dL — ABNORMAL HIGH (ref 70–99)

## 2018-03-14 MED ORDER — SODIUM CHLORIDE 0.9% FLUSH: 10.0000 mL | INTRAVENOUS | Status: DC | PRN

## 2018-03-14 MED ORDER — SODIUM CHLORIDE 0.9 % IV SOLN
1.0000 g | Freq: Once | INTRAVENOUS | Status: AC
  Administered 2018-03-14: 1 g via INTRAVENOUS
  Filled 2018-03-14: qty 1

## 2018-03-14 MED ORDER — VANCOMYCIN HCL IN DEXTROSE 1-5 GM/200ML-% IV SOLN
1000.0000 mg | Freq: Once | INTRAVENOUS | Status: AC
Start: 2018-03-14 — End: 2018-03-14
  Administered 2018-03-14: 1000 mg via INTRAVENOUS
  Filled 2018-03-14: qty 200

## 2018-03-14 MED ORDER — ACETAMINOPHEN 325 MG PO TABS
325.0000 mg | ORAL_TABLET | Freq: Once | ORAL | Status: AC
  Administered 2018-03-14: 325 mg via ORAL
  Filled 2018-03-14: qty 1

## 2018-03-14 MED ORDER — ACETAMINOPHEN 325 MG PO TABS
650.0000 mg | ORAL_TABLET | Freq: Four times a day (QID) | ORAL | Status: DC | PRN
  Administered 2018-03-14: 650 mg via ORAL
  Filled 2018-03-14: qty 2

## 2018-03-14 MED ORDER — HEPARIN SODIUM (PORCINE) 5000 UNIT/ML IJ SOLN
5000.0000 [IU] | Freq: Three times a day (TID) | INTRAMUSCULAR | Status: DC
Start: 2018-03-14 — End: 2018-03-15
  Administered 2018-03-14 (×2): 5000 [IU] via SUBCUTANEOUS
  Filled 2018-03-14 (×3): qty 1

## 2018-03-14 MED ORDER — INSULIN GLARGINE 100 UNIT/ML ~~LOC~~ SOLN
5.0000 [IU] | Freq: Every day | SUBCUTANEOUS | Status: DC
  Administered 2018-03-14: 5 [IU] via SUBCUTANEOUS
  Filled 2018-03-14: qty 0.05

## 2018-03-14 NOTE — Progress Notes (Signed)
Comfortable.  Denies any pain in her left arm.  No steal symptoms.  2+ left radial pulse.  Excellent thrill in her upper arm fistula.  Will not follow actively.  We will see her in the office in 4 to 6 weeks for follow-up of the left upper arm fistula

## 2018-03-14 NOTE — Progress Notes (Addendum)
Patient ID: [name], female   DOB: 09/30/1955, 62 y.o.   MRN: [id number]                                                                PROGRESS NOTE                                                                                                                                                                                                             Patient Demographics:    Rhonda Lynch, is a 62 y.o. female, DOB - November 21, 1955, [id number]:[id number]  Admit date - 02/12/2018   Admitting Physician Vianne Bulls, MD  Outpatient Primary MD for the patient is Patient, No Pcp Per  LOS - 30  Outpatient Specialists:  Chief Complaint  Patient presents with  . Abdominal Pain       Brief Narrative   62 year old female with past medical history of cocaine/alcohol abuse who was admitted on 02/12/2018 with abdominal pain, nausea, vomiting. She was found to have Streptococcus pneumonia and also had acute kidney injury on presentation. Nephrology was consulted . 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 She has progressed to end-stage renal disease and is currently undergoing dialysis, plasmapheresis and has been started, on steroids and cyclophosphamide. Discharge planning after setting of outpatient dialysis facility. Received AV graft on 6/26/2019with hematoma which was subsequently evacuated.    Subjective:    Rhonda Lynch today has No headache, No chest pain, she does have watery diarrhea this morning, she had fever 101.6 overnight.   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 -Status post left upper extremity aVF by Dr. Ma Rings 6/26, status post hematoma evacuation 03/06/2018 by Dr. Doren Custard, still have some swelling of the left arm, vascular surgery follow-up appreciated, this may be related to venous outflow stenosis, can be followed as an outpatient. -Per nursing staff patient with some brown drainage from her chest permacath, to be sent for culture once available.  I have discussed with nursing staff, there is no drainage noticed for last 48 hours.  Fever -Vision with significant fever 101.6 more lethargic this morning, but currently back to baseline, she had profuse diarrhea and some minimal abdominal tenderness, her C. difficile came back negative, peripheral blood cultures were sent, set of blood cultures from 100 permacath still pending, chest x-ray with no evidence of pneumonia, she denies any cough, urine cultures pending. -Empirically on vancomycin and Fortaz per renal to cover empirically for catheter infection pending further work-up.   MPO ANCA Rapidly progressive, nephritis -Status post plasmapheresis, currently on Cytoxan 100 mg daily, and Bactrim BCP for prophylaxis, continue with prednisone taper  Diabetes mellitus -CBG is very labile, CBG this morning is 83, Lantus decreased again to 5 units, continue with insulin sliding scale and 3 units NovoLog before meals as well .  Anemia -Anemia of chronic kidney disease, and acute blood loss anemia, -6.6 yesterday, transfused 2 units with good  response, continue to monitor closely -Procrit per renal   Pancytopenia.  - Trending down leukocytosis platelet count and anemia. Patient is on cyclophosphamide and Bactrim. Nephrology aware. Will need to closely monitor. Getting Aranesp.If there is worsening pancytopenia, consider hematology evaluation.  Diffuse alveolar hemorrhage versus bronchiolitis obliterans or cryptogenic organizing pneumonia as per the imaging. No active issues at this time. X-ray did not show any obvious findings on 6/27/2019and pulmonary followed the patient during hospitalization. Currently stable. 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   Sepsis secondary to streptococcal pneumonia: Urine antigen was positive for streptococcus. Blood cultures have been negative. Completed the course of antibiotics. 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 Patient will need to follow-up with infectious disease as outpatient.   History of cocaine abuse: UDS was positive for cocaine on admission. Currently stable. No agitation or anxiety noted  Leukocytosis:WBC trending down. On cyclophosphamide. On steroids. Will need to closely monitor for downtrending WBC.      Code Status:FULL CODE  Family Communication :D/w patient  Disposition Plan:home once cleared by nephrology  Barriers For Discharge: CLIP  Consults :nephrology, vascular  Procedures : L AVF 6/26, and L AVF exploration 6/27  DVT Prophylaxis: Heparin on hold pending HIT - SCDs      RecentLabs       Lab Results  Component Value Date   PLT 131 (L) 03/09/2018      Antibiotics  :  Cefazolin 6/27      Anti-infectives (From admission, onward)   Start     Dose/Rate Route Frequency Ordered Stop   03/14/18 1230  vancomycin (VANCOCIN) IVPB 1000 mg/200 mL premix     1,000 mg 200 mL/hr over 60 Minutes Intravenous  Once 03/14/18 1221      03/14/18 1230  cefTAZidime (FORTAZ) 1 g in sodium chloride 0.9 % 100 mL IVPB     1 g 200 mL/hr over 30 Minutes Intravenous  Once 03/14/18 1221     03/06/18 1425  ceFAZolin (ANCEF) 2-4 GM/100ML-% IVPB  Note to Pharmacy:  [facility]   : cabinet override      03/06/18 1425 03/07/18 0229   03/05/18 1415  ceFAZolin (ANCEF) IVPB 2g/100 mL premix     2 g 200 mL/hr over 30 Minutes Intravenous  Once 03/05/18 1401 03/05/18 1445   03/05/18 1403  ceFAZolin (ANCEF) 2-4 GM/100ML-% IVPB    Note to Pharmacy:  [facility]   : cabinet override      03/05/18 1403 03/05/18 1445   02/26/18 1615  ceFAZolin (ANCEF) IVPB 2g/100 mL premix  Status:  Discontinued     2 g 200 mL/hr over 30 Minutes Intravenous  Once 02/26/18 1608 03/04/18 1020   02/26/18 1556  ceFAZolin (ANCEF) 2-4 GM/100ML-% IVPB    Note to Pharmacy:  [facility]   : cabinet override      02/26/18 1556 02/26/18 1630   02/21/18 0900  sulfamethoxazole-trimethoprim (BACTRIM DS,SEPTRA DS) 800-160 MG per tablet 1 tablet     1 tablet Oral Once per day on Mon Wed Fri 02/20/18 1116     02/13/18 1900  cefTRIAXone (ROCEPHIN) 1 g in sodium chloride 0.9 % 100 mL IVPB     1 g 200 mL/hr over 30 Minutes Intravenous Every 24 hours 02/12/18 2122 02/18/18 2014   02/13/18 1900  azithromycin (ZITHROMAX) 500 mg in sodium chloride 0.9 % 250 mL IVPB  Status:  Discontinued     500 mg 250 mL/hr over 60 Minutes Intravenous Every 24 hours 02/12/18 2122 02/14/18 0819   02/12/18 1915  cefTRIAXone (ROCEPHIN) 2 g in sodium chloride 0.9 % 100 mL IVPB  Status:  Discontinued     2 g 200 mL/hr over 30 Minutes Intravenous Every 24 hours 02/12/18 1901 02/12/18 2125   02/12/18 1915  azithromycin (ZITHROMAX) 500 mg in sodium chloride 0.9 % 250 mL IVPB  Status:  Discontinued     500 mg 250 mL/hr over 60 Minutes Intravenous Every 24 hours 02/12/18 1901 02/12/18 2125        Objective:   Vitals:   03/14/18 0353 03/14/18 0555 03/14/18 0559 03/14/18 0821  BP:     (!) 177/91  Pulse:    76  Resp:    18  Temp: 99.6 F (37.6 C)   99.7 F (37.6 C)  TempSrc: Oral   Oral  SpO2:  96% 98% 96%  Weight: 76.1 kg (167 lb 12.3 oz)     Height:        Wt Readings from Last 3 Encounters:  03/14/18 76.1 kg (167 lb 12.3 oz)     Intake/Output Summary (Last 24 hours) at 03/14/2018 1343 Last data filed at 03/14/2018 0600 Gross per 24 hour  Intake 240 ml  Output -  Net 240 ml     Physical Exam  More somnolent this morning, but back to baseline in the afternoon,  Good air entry bilaterally, clear to auscultation  Regular rate and rhythm, no rubs murmurs gallops  Soft, mild diffuse tenderness, bowel sounds present, no rebound, no guarding  Left aVF with some swelling, no significant changes from yesterday, right upper chest hemodialysis catheter with no discharge or secretions.      Data Review:    CBC Recent Labs  Lab 03/11/18 0740 03/12/18 0344 03/12/18 1242 03/13/18 0757 03/14/18 0811  WBC 8.4 6.2 7.2 6.2 4.2  HGB 6.6* 9.0* 9.7* 8.7* 10.0*  HCT 20.4* 26.8* 29.1* 26.4* 30.4*  PLT 149* 112* 115* 121* 122*  MCV 80.6 80.2 80.8 81.0 81.9  MCH 26.1  26.9 26.9 26.7 27.0  MCHC 32.4 33.6 33.3 33.0 32.9  RDW 20.8* 18.2* 18.5* 18.5* 18.5*    Chemistries  Recent Labs  Lab 03/10/18 1713 03/11/18 0256 03/12/18 0344 03/12/18 1247 03/13/18 0757  NA 135 133* 135 135 136  K 4.7 4.7 4.4 4.2 4.1  CL 99 98 99 98 100  CO2 25 25 27 25 28   GLUCOSE 225* 180* 232* 231* 108*  BUN 64* 78* 46* 52* 67*  CREATININE 5.30* 6.00* 4.09* 4.35* 5.01*  CALCIUM 7.9* 7.8* 7.6* 8.0* 8.2*  AST  --   --  26  --   --   ALT  --   --  15  --   --   ALKPHOS  --   --  57  --   --   BILITOT  --   --  0.8  --   --    ------------------------------------------------------------------------------------------------------------------ No results for input(s): CHOL, HDL, LDLCALC, TRIG, CHOLHDL, LDLDIRECT in the last 72 hours.  Lab Results  Component Value Date   HGBA1C 6.8  (H) 03/03/2018   ------------------------------------------------------------------------------------------------------------------ No results for input(s): TSH, T4TOTAL, T3FREE, THYROIDAB in the last 72 hours.  Invalid input(s): FREET3 ------------------------------------------------------------------------------------------------------------------ No results for input(s): VITAMINB12, FOLATE, FERRITIN, TIBC, IRON, RETICCTPCT in the last 72 hours.  Coagulation profile No results for input(s): INR, PROTIME in the last 168 hours.  No results for input(s): DDIMER in the last 72 hours.  Cardiac Enzymes No results for input(s): CKMB, TROPONINI, MYOGLOBIN in the last 168 hours.  Invalid input(s): CK ------------------------------------------------------------------------------------------------------------------    Component Value Date/Time   BNP 225.7 (H) 02/12/2018 1534    Inpatient Medications  Scheduled Meds: . Chlorhexidine Gluconate Cloth  6 each Topical Q0600  . cyclophosphamide  100 mg Oral Daily  . darbepoetin (ARANESP) injection - DIALYSIS  40 mcg Intravenous Q Thu-HD  . famotidine  20 mg Oral Daily  . insulin aspart  0-9 Units Subcutaneous TID WC  . insulin aspart  3 Units Subcutaneous TID WC  . insulin glargine  5 Units Subcutaneous QHS  . multivitamin with minerals  1 tablet Oral Daily  . polyethylene glycol  17 g Oral Daily  . predniSONE  60 mg Oral Q breakfast  . sodium chloride flush  3 mL Intravenous Q12H  . sulfamethoxazole-trimethoprim  1 tablet Oral Once per day on Mon Wed Fri  . thiamine  100 mg Oral Daily   Continuous Infusions: . sodium chloride 10 mL/hr at 03/05/18 1341  . sodium chloride 10 mL/hr at 03/06/18 1429  . cefTAZidime (FORTAZ)  IV    . vancomycin     PRN Meds:.acetaminophen, alum & mag hydroxide-simeth, haloperidol lactate, hydrALAZINE, HYDROcodone-acetaminophen, HYDROmorphone (DILAUDID) injection, LORazepam, ondansetron **OR** ondansetron  (ZOFRAN) IV, senna-docusate, zolpidem  Micro Results Recent Results (from the past 240 hour(s))  Culture, blood (routine x 2)     Status: None   Collection Time: 03/06/18  6:30 PM  Result Value Ref Range Status   Specimen Description BLOOD RIGHT ARM  Final   Special Requests   Final    BOTTLES DRAWN AEROBIC AND ANAEROBIC Blood Culture adequate volume   Culture   Final    NO GROWTH 5 DAYS Performed at [facility], [place] [address], [place], [place] [address]    Report Status 03/11/2018 FINAL  Final  Culture, blood (routine x 2)     Status: None   Collection Time: 03/06/18  6:37 PM  Result Value Ref Range Status   Specimen  Description BLOOD RIGHT ARM  Final   Special Requests   Final    BOTTLES DRAWN AEROBIC AND ANAEROBIC Blood Culture adequate volume   Culture   Final    NO GROWTH 5 DAYS Performed at [facility], [address] [place], [place] [address]    Report Status 03/11/2018 FINAL  Final  C difficile quick scan w PCR reflex     Status: None   Collection Time: 03/14/18  9:24 AM  Result Value Ref Range Status   C Diff antigen NEGATIVE NEGATIVE Final   C Diff toxin NEGATIVE NEGATIVE Final   C Diff interpretation No C. difficile detected.  Final    Comment: Performed at [facility], [place] [address] [place], [place] [address]    Radiology Reports Ct Abdomen Pelvis Wo Contrast  Result Date: 02/12/2018 CLINICAL DATA:  Epigastric abdominal pain for the past week. EXAM: CT ABDOMEN AND PELVIS WITHOUT CONTRAST TECHNIQUE: Multidetector CT imaging of the abdomen and pelvis was performed following the standard protocol without IV contrast. COMPARISON:  CT abdomen pelvis dated February 19, 2010. FINDINGS: Lower chest: Confluent ground-glass density at the right greater than left lung bases with inter and intralobular septal thickening, consistent with crazy paving pattern. This spares the subpleural lung. Hepatobiliary: No focal liver abnormality is seen. Status post  cholecystectomy. Mild central intrahepatic and common bile duct dilatation, likely related to post cholecystectomy state. Pancreas: Unremarkable. No pancreatic ductal dilatation or surrounding inflammatory changes. Spleen: Normal in size without focal abnormality. Adrenals/Urinary Tract: Adrenal glands are unremarkable. Kidneys are normal, without renal calculi, focal lesion, or hydronephrosis. Bladder is unremarkable. Stomach/Bowel: Stomach is within normal limits. Appendix appears normal. No evidence of bowel wall thickening, distention, or inflammatory changes. Vascular/Lymphatic: Aortic atherosclerosis. No enlarged abdominal or pelvic lymph nodes. Reproductive: Prior hysterectomy. 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 Other: Tiny fat containing umbilical hernia. No free fluid or pneumoperitoneum. Musculoskeletal: No acute or significant osseous findings. 3 mm anterolisthesis at L4-L5 due to severe facet arthropathy. Which IMPRESSION: 1.  No acute intra-abdominal process. 2. Confluent ground-glass density and inter and intralobular septal thickening at the right greater than left lung bases, consistent with crazy paving pattern. While this pattern is nonspecific, given the sparing of the subpleural lung, this may be related to cryptogenic organizing pneumonia. Pulmonology consultation is recommended. 3. 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 Electronically Signed   By: Titus Dubin M.D.   On: 02/12/2018 20:01   Dg Chest 1 View  Result Date: 02/14/2018 CLINICAL DATA:  Shortness of breath EXAM: CHEST  1 VIEW COMPARISON:  02/12/2018 FINDINGS: Severe diffuse bilateral disease throughout the lungs, stable. Cardiomegaly. No visible effusions or acute bony abnormality. IMPRESSION: Severe bilateral airspace disease could reflect edema/CHF or infection. No change. Electronically Signed   By: Rolm Baptise M.D.   On: 02/14/2018 09:38   US Renal  Result Date: 02/14/2018 CLINICAL DATA:  Acute kidney injury.  EXAM: RENAL / URINARY TRACT ULTRASOUND COMPLETE COMPARISON:  CT abdomen pelvis dated February 12, 2018. FINDINGS: Right Kidney: Length: 11.0 cm. Increased echogenicity. No mass or hydronephrosis visualized. Left Kidney: Length: 10.9 cm. Increased echogenicity. No mass or hydronephrosis visualized. Bladder: Decompressed by a Foley catheter. IMPRESSION: Increased renal cortical echogenicity bilaterally, consistent with medical renal disease. Electronically Signed   By: Titus Dubin M.D.   On: 02/14/2018 22:34   Ct Chest High Resolution  Result Date: 02/18/2018 CLINICAL DATA:  62 year old female with history of alcohol  and cocaine abuse presenting with dyspnea and productive cough. Abnormal chest x-ray. EXAM: CT CHEST WITHOUT CONTRAST TECHNIQUE: Multidetector CT imaging of the chest was performed following the standard protocol without intravenous contrast. High resolution imaging of the lungs, as well as inspiratory and expiratory imaging, was performed. COMPARISON:  No prior chest CT.  Chest x-ray 02/14/2018. FINDINGS: Cardiovascular: Heart size is mildly enlarged. There is no significant pericardial fluid, thickening or pericardial calcification. Aortic atherosclerosis. No definite coronary artery calcifications. Mediastinum/Nodes: No pathologically enlarged mediastinal or hilar lymph nodes. Please note that accurate exclusion of hilar adenopathy is limited on noncontrast CT scans. Esophagus is unremarkable in appearance. No axillary lymphadenopathy. Lungs/Pleura: High-resolution images demonstrate widespread areas of ground-glass attenuation and septal thickening, as well as widespread areas of peripheral bronchiolectasis. Subpleural sparing is noted, particularly throughout the mid to lower lungs. These findings have no definitive craniocaudal gradient, but appear slightly more pronounced throughout the mid to upper lungs than the lower lungs. No honeycombing. Inspiratory and expiratory imaging is unremarkable.  Linear area of scarring or atelectasis in the periphery of the left lower lobe. Trace left pleural effusion lying dependently. Upper Abdomen: Unremarkable. Musculoskeletal: There are no aggressive appearing lytic or blastic lesions noted in the visualized portions of the skeleton. IMPRESSION: 1. The appearance of the lungs is nonspecific, but there are imaging findings that are suggestive of cryptogenic organizing pneumonia (COP). Other differential considerations include both cardiogenic and noncardiogenic edema, however, this is not strongly favored. Evaluation by Pulmonology is strongly recommended. 2. Aortic atherosclerosis. Aortic Atherosclerosis (ICD10-I70.0). Electronically Signed   By: Vinnie Langton M.D.   On: 02/18/2018 08:37   Ir Fluoro Guide Cv Line Right  Result Date: 02/26/2018 INDICATION: 89 year old with acute kidney injury and on dialysis. Patient currently has a non tunneled dialysis catheter and needs conversion to a tunneled dialysis catheter. EXAM: CONVERSION OF NON TUNNELED DIALYSIS CATHETER TO A TUNNELED DIALYSIS CATHETER WITH FLUOROSCOPY Physician: Stephan Minister. Anselm Pancoast, MD MEDICATIONS: Ancef 2 g; The antibiotic was administered within an appropriate time interval prior to skin puncture. ANESTHESIA/SEDATION: Versed 1.5 mg IV; Fentanyl 100 mcg IV; Moderate Sedation Time:  20 minutes The patient was continuously monitored during the procedure by the interventional radiology nurse under my direct supervision. FLUOROSCOPY TIME:  Fluoroscopy Time: 18 seconds, 1 mGy COMPLICATIONS: None immediate. PROCEDURE: The existing right jugular dialysis catheter was evaluated. This catheter was felt to be adequate for conversion to a tunneled catheter. The procedure was explained to the patient. The risks and benefits of the procedure were discussed and the patient's questions were addressed. Informed consent was obtained from the patient. The existing catheter and the right upper chest were prepped and draped  in a sterile fashion. Maximal barrier sterile technique was utilized including caps, mask, sterile gowns, sterile gloves, sterile drape, hand hygiene and skin antiseptic. The skin below the right clavicle and the skin around the catheter were anesthetized with 1% lidocaine. A small incision was made below the right clavicle. A subcutaneous tunnel was formed from the incision to the old catheter site. The old catheter was removed over a Amplatz wire. The wire was advanced into the IVC. A 19 cm tip to cuff Palindrome catheter was pulled through the subcutaneous tunnel. Peel-away sheath was placed over the Amplatz wire. The catheter was placed through the peel-away sheath and positioned at the superior cavoatrial junction. Both lumens aspirated and flushed well. The catheter lumens were flushed with appropriate amount of heparin. The catheter was sutured to skin with Prolene  suture. The old jugular right access site was closed using 2 interrupted Ethilon sutures. Fluoroscopic images were taken and saved for this procedure. FINDINGS: Catheter tip at the superior cavoatrial junction. IMPRESSION: Successful conversion of the right jugular catheter to a tunneled dialysis catheter. Right neck sutures can be removed in 10-14 days. Electronically Signed   By: Markus Daft M.D.   On: 02/26/2018 17:16   Ir Fluoro Guide Cv Line Right  Result Date: 02/18/2018 INDICATION: 62 year old female with renal failure in need of hemodialysis EXAM: IR RIGHT FLOURO GUIDE CV LINE; IR ULTRASOUND GUIDANCE VASC ACCESS RIGHT MEDICATIONS: None ANESTHESIA/SEDATION: None FLUOROSCOPY TIME:  Fluoroscopy Time: 0 minutes 6 seconds (0 mGy). COMPLICATIONS: None immediate. PROCEDURE: Informed written consent was obtained from the patient after a thorough discussion of the procedural risks, benefits and alternatives. All questions were addressed. Maximal Sterile Barrier Technique was utilized including caps, mask, sterile gowns, sterile gloves, sterile  drape, hand hygiene and skin antiseptic. A timeout was performed prior to the initiation of the procedure. The right internal jugular vein was interrogated with ultrasound and found to be widely patent. An image was obtained and stored for the medical record. Local anesthesia was attained by infiltration with 1% lidocaine. A small dermatotomy was made. Under real-time sonographic guidance, the vessel was punctured with a 21 gauge micropuncture needle. Using standard technique, the initial micro needle was exchanged over a 0.018 micro wire for a transitional 4 [place] micro sheath. The micro sheath was then exchanged over a 0.035 wire for a dilator which was used to dilate the soft tissue tract. A 20 cm 3 lumen non tunneled dialysis catheter was then advanced over the wire and position with the tip in the upper right atrium. The catheter flushed and aspirated with ease. The catheter was flushed with heparinized saline and secured to the skin with 0 Prolene suture. The patient tolerated the procedure well. IMPRESSION: Successful placement of a non tunneled Trialysis catheter via the right internal jugular vein. The catheter tip is in the upper right atrium and ready for immediate use. Signed, Criselda Peaches, MD [facility] Electronically Signed   By: Jacqulynn Cadet M.D.   On: 02/18/2018 16:08   Ir US Guide Vasc Access Right  Result Date: 02/18/2018 INDICATION: 62 year old female with renal failure in need of hemodialysis EXAM: IR RIGHT FLOURO GUIDE CV LINE; IR ULTRASOUND GUIDANCE VASC ACCESS RIGHT MEDICATIONS: None ANESTHESIA/SEDATION: None FLUOROSCOPY TIME:  Fluoroscopy Time: 0 minutes 6 seconds (0 mGy). COMPLICATIONS: None immediate. PROCEDURE: Informed written consent was obtained from the patient after a thorough discussion of the procedural risks, benefits and alternatives. All questions were addressed. Maximal Sterile Barrier Technique was  utilized including caps, mask, sterile gowns, sterile gloves, sterile drape, hand hygiene and skin antiseptic. A timeout was performed prior to the initiation of the procedure. The right internal jugular vein was interrogated with ultrasound and found to be widely patent. An image was obtained and stored for the medical record. Local anesthesia was attained by infiltration with 1% lidocaine. A small dermatotomy was made. Under real-time sonographic guidance, the vessel was punctured with a 21 gauge micropuncture needle. Using standard technique, the initial micro needle was exchanged over a 0.018 micro wire for a transitional 4 [place] micro sheath. The micro sheath was then exchanged over a 0.035 wire for a dilator which was used to dilate the soft tissue tract. A 20 cm 3 lumen non tunneled dialysis catheter was then advanced over the  wire and position with the tip in the upper right atrium. The catheter flushed and aspirated with ease. The catheter was flushed with heparinized saline and secured to the skin with 0 Prolene suture. The patient tolerated the procedure well. IMPRESSION: Successful placement of a non tunneled Trialysis catheter via the right internal jugular vein. The catheter tip is in the upper right atrium and ready for immediate use. Signed, Criselda Peaches, MD [facility] Electronically Signed   By: Jacqulynn Cadet M.D.   On: 02/18/2018 16:08   Dg Chest Port 1 View  Result Date: 03/14/2018 CLINICAL DATA:  Fever EXAM: PORTABLE CHEST 1 VIEW COMPARISON:  March 06, 2018 FINDINGS: Central catheter tip is in the superior vena cava near the cavoatrial junction. No pneumothorax. There is mild interstitial edema diffusely with minimal pleural effusions bilaterally. No consolidation. Heart is mildly enlarged with pulmonary venous hypertension. No adenopathy. There is aortic atherosclerosis. No bone lesions. IMPRESSION: Central catheter as  described without pneumothorax. Cardiomegaly with mild pulmonary venous hypertension consistent with a degree of pulmonary vascular congestion. Mild interstitial edema with minimal pleural effusions. No consolidation. There is aortic atherosclerosis. Aortic Atherosclerosis (ICD10-I70.0). Electronically Signed   By: Lowella Grip III M.D.   On: 03/14/2018 07:30   Dg Chest Port 1 View  Result Date: 03/06/2018 CLINICAL DATA:  Fever. EXAM: PORTABLE CHEST 1 VIEW COMPARISON:  CT 02/17/2018.  Chest x-ray 02/14/2018. FINDINGS: Right IJ dual-lumen catheter with tip over cavoatrial junction. Cardiomegaly. No focal infiltrate. No pleural effusion or pneumothorax. No acute bony abnormality. J IMPRESSION: 1.  Right IJ dual-lumen catheter with tip over cavoatrial junction. 2. No acute cardiopulmonary disease. No evidence of pulmonary infiltrate. Electronically Signed   By: Marcello Moores  Register   On: 03/06/2018 13:47   Dg Chest Port 1 View  Result Date: 02/12/2018 CLINICAL DATA:  Hypoxia. EXAM: PORTABLE CHEST 1 VIEW COMPARISON:  Chest x-ray dated December 10, 2011. FINDINGS: The heart size and mediastinal contours are within normal limits. Normal pulmonary vascularity. Confluent airspace disease involving the right central lung and left perihilar lung. No pleural effusion or pneumothorax. No acute osseous abnormality. IMPRESSION: 1. Confluent airspace disease in the right greater than left lungs. Electronically Signed   By: Titus Dubin M.D.   On: 02/12/2018 18:53   US Biopsy (kidney)  Result Date: 02/17/2018 CLINICAL DATA:  Acute kidney injury and microscopic hematuria. Need for renal biopsy. EXAM: ULTRASOUND GUIDED CORE BIOPSY OF LEFT KIDNEY MEDICATIONS: 1.5 mg IV Versed; 25 mcg IV Fentanyl Total Moderate Sedation Time: 17 minutes. [name] level of consciousness and physiologic status were continuously monitored during the procedure by Radiology nursing. The patient was also given 20 mg of IV hydralazine at the  beginning of the procedure and prior to needle advancement to treat hypertension. PROCEDURE: The procedure, risks, benefits, and alternatives were explained to the patient. Questions regarding the procedure were encouraged and answered. The patient understands and consents to the procedure. A time out was performed prior to initiating the procedure. Ultrasound was utilized to image both kidneys. The left flank region was prepped with chlorhexidine in a sterile fashion, and a sterile drape was applied covering the operative field. A sterile gown and sterile gloves were used for the procedure. Local anesthesia was provided with 1% Lidocaine. Core biopsy was performed at the level of lower pole cortex of the left kidney with a 16 gauge needle device. Two separate core biopsy samples were obtained and submitted in saline. Additional  ultrasound was performed. COMPLICATIONS: None. FINDINGS: Both kidneys were well visualized by ultrasound. The left kidney was chosen for biopsy. Solid tissue was obtained from the level of the lower pole cortex. Ultrasound shows no evidence of immediate bleeding complication. IMPRESSION: Ultrasound-guided core biopsy performed of the left kidney at the level of lower pole cortex. Electronically Signed   By: Aletta Edouard M.D.   On: 02/17/2018 13:07    Phillips Climes M.D on 03/14/2018 at 1:43 PM  Between 7am to 7pm - Pager - [phone]  After 7pm go to [email or web address] - password [facility]  [facility] -  Office  [phone]

## 2018-03-14 NOTE — Progress Notes (Addendum)
S:Pt was difficult to arouse this morning and reports that "I don't feel good" O:BP (!) 177/91 (BP Location: Right Arm)   Pulse 76   Temp 99.7 F (37.6 C) (Oral)   Resp 18   Ht 5\' 1"  (1.549 m)   Wt 76.1 kg (167 lb 12.3 oz)   SpO2 96%   BMI 31.70 kg/m   Intake/Output Summary (Last 24 hours) at 03/14/2018 1215 Last data filed at 03/14/2018 0600 Gross per 24 hour  Intake 480 ml  Output -  Net 480 ml   Intake/Output: I/O last 3 completed shifts: In: 723 [P.O.:720; I.V.:3] Out: 1900 [Other:1900]  Intake/Output this shift:  No intake/output data recorded. Weight change: -0.8 kg (-1 lb 12.2 oz) Gen: lethargic but arousable and appropriate CVS:no rub Resp: cta Abd: benign Ext: no edema, LUE AVF +T/B, RIJ TDC without drainage or tenderness  Recent Labs  Lab 03/08/18 0231 03/09/18 0843 03/10/18 1713 03/11/18 0256 03/12/18 0344 03/12/18 1247 03/13/18 0757  NA 137 136 135 133* 135 135 136  K 4.7 3.9 4.7 4.7 4.4 4.2 4.1  CL 99 98 99 98 99 98 100  CO2 30 26 25 25 27 25 28   GLUCOSE 152* 216* 225* 180* 232* 231* 108*  BUN 61* 40* 64* 78* 46* 52* 67*  CREATININE 5.47* 3.74* 5.30* 6.00* 4.09* 4.35* 5.01*  ALBUMIN  --   --  3.2* 3.1* 3.1* 3.1* 3.0*  CALCIUM 7.8* 7.9* 7.9* 7.8* 7.6* 8.0* 8.2*  PHOS  --   --  3.1 3.4  --  2.9 3.8  AST  --   --   --   --  26  --   --   ALT  --   --   --   --  15  --   --    Liver Function Tests: Recent Labs  Lab 03/12/18 0344 03/12/18 1247 03/13/18 0757  AST 26  --   --   ALT 15  --   --   ALKPHOS 57  --   --   BILITOT 0.8  --   --   PROT 4.8*  --   --   ALBUMIN 3.1* 3.1* 3.0*   No results for input(s): LIPASE, AMYLASE in the last 168 hours. No results for input(s): AMMONIA in the last 168 hours. CBC: Recent Labs  Lab 03/11/18 0740 03/12/18 0344 03/12/18 1242 03/13/18 0757 03/14/18 0811  WBC 8.4 6.2 7.2 6.2 4.2  HGB 6.6* 9.0* 9.7* 8.7* 10.0*  HCT 20.4* 26.8* 29.1* 26.4* 30.4*  MCV 80.6 80.2 80.8 81.0 81.9  PLT 149* 112* 115*  121* 122*   Cardiac Enzymes: No results for input(s): CKTOTAL, CKMB, CKMBINDEX, TROPONINI in the last 168 hours. CBG: Recent Labs  Lab 03/13/18 2145 03/13/18 2219 03/14/18 0218 03/14/18 0810 03/14/18 1157  GLUCAP 60* 96 100* 100* 83    Iron Studies: No results for input(s): IRON, TIBC, TRANSFERRIN, FERRITIN in the last 72 hours. Studies/Results: Dg Chest Port 1 View  Result Date: 03/14/2018 CLINICAL DATA:  Fever EXAM: PORTABLE CHEST 1 VIEW COMPARISON:  March 06, 2018 FINDINGS: Central catheter tip is in the superior vena cava near the cavoatrial junction. No pneumothorax. There is mild interstitial edema diffusely with minimal pleural effusions bilaterally. No consolidation. Heart is mildly enlarged with pulmonary venous hypertension. No adenopathy. There is aortic atherosclerosis. No bone lesions. IMPRESSION: Central catheter as described without pneumothorax. Cardiomegaly with mild pulmonary venous hypertension consistent with a degree of pulmonary vascular congestion. Mild interstitial edema  with minimal pleural effusions. No consolidation. There is aortic atherosclerosis. Aortic Atherosclerosis (ICD10-I70.0). Electronically Signed   By: Lowella Grip III M.D.   On: 03/14/2018 07:30   . Chlorhexidine Gluconate Cloth  6 each Topical Q0600  . cyclophosphamide  100 mg Oral Daily  . darbepoetin (ARANESP) injection - DIALYSIS  40 mcg Intravenous Q Thu-HD  . famotidine  20 mg Oral Daily  . insulin aspart  0-9 Units Subcutaneous TID WC  . insulin aspart  3 Units Subcutaneous TID WC  . insulin glargine  5 Units Subcutaneous QHS  . multivitamin with minerals  1 tablet Oral Daily  . polyethylene glycol  17 g Oral Daily  . predniSONE  60 mg Oral Q breakfast  . sodium chloride flush  3 mL Intravenous Q12H  . sulfamethoxazole-trimethoprim  1 tablet Oral Once per day on Mon Wed Fri  . thiamine  100 mg Oral Daily    BMET    Component Value Date/Time   NA 136 03/13/2018 0757   K 4.1  03/13/2018 0757   CL 100 03/13/2018 0757   CO2 28 03/13/2018 0757   GLUCOSE 108 (H) 03/13/2018 0757   BUN 67 (H) 03/13/2018 0757   CREATININE 5.01 (H) 03/13/2018 0757   CALCIUM 8.2 (L) 03/13/2018 0757   GFRNONAA 8 (L) 03/13/2018 0757   GFRAA 10 (L) 03/13/2018 0757   CBC    Component Value Date/Time   WBC 4.2 03/14/2018 0811   RBC 3.71 (L) 03/14/2018 0811   HGB 10.0 (L) 03/14/2018 0811   HCT 30.4 (L) 03/14/2018 0811   PLT 122 (L) 03/14/2018 0811   MCV 81.9 03/14/2018 0811   MCH 27.0 03/14/2018 0811   MCHC 32.9 03/14/2018 0811   RDW 18.5 (H) 03/14/2018 0811   LYMPHSABS 1.1 03/05/2018 0328   MONOABS 0.7 03/05/2018 0328   EOSABS 0.0 03/05/2018 0328   BASOSABS 0.0 03/05/2018 0328      Assessment/Plan: 1. Fevers and malaise- Tmax 101.6, will need to check blood and urine cultures.  Will empirically start Vanc/Fortaz in case of HD Catheter related bacteremia, although no drainage or tenderness. 1. Blood cultures drawn, C diff quick scan negative 2. Order urine culture and sens. 3. Not able to discharge due to new onset of fevers and malaise. 4. We might have to stop cytoxan and prednisone if fevers persist. 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  1. Will cont with cytoxan 100mg  daily and follow WBC at outpatient HD unit and bactrim PCP prophylaxis and cont with prednisone taper. Will decrease to 50mg  and continue slow taper. 2. Pt to follow up with Dr. Carolin Sicks as an outpatient as well or can be managed at her HD unit once established 3. ESRD- outpatient HD at [facility] 2 TTS second shift, unfortunately cannot be discharged today due to fevers and malaise.  Plan for HD here tomorrow. 4. ABLA/Anemiaof chronic disease:pt with significant drop in Hgb and transfused 2 uPRBC's and cont with ESA. Hgb stable. 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 Nutrition:renal  diet 7. Hypertension:stable 8. Thrombocytopenia- ? Related to cytoxan or possibly heparin, will send off HIT panel and follow 9. Vascular access- s/p LUE AVF placed 03/05/18 by Dr. Donnetta Hutching s/p hematoma evacuation on 03/06/18 by Dr. 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 Donnetta Hutching. 10. Disposition- awaiting outpatient HD unit to be arranged.  Donetta Potts, MD [employer] [phone]

## 2018-03-14 NOTE — Progress Notes (Signed)
Patient had a temp over night up to 101.6 Oral, and relieved to 99.6 Oral after 975 mg of Tylenol.  She complained approximately 0600 of breathing difficulty, O2 saturation was 96% on room air which was unchanged from previous reading.  Lung Sounds remained clear bilaterally. Patient was using Oxygen at 2L [place], earlier yesterday.  Patient came up to 98% with 2L and was able to fall back to sleep.

## 2018-03-15 LAB — CBC
HCT: 25.6 % — ABNORMAL LOW (ref 36.0–46.0)
HEMOGLOBIN: 8.4 g/dL — AB (ref 12.0–15.0)
MCH: 26.8 pg (ref 26.0–34.0)
MCHC: 32.8 g/dL (ref 30.0–36.0)
MCV: 81.8 fL (ref 78.0–100.0)
Platelets: 117 10*3/uL — ABNORMAL LOW (ref 150–400)
RBC: 3.13 MIL/uL — ABNORMAL LOW (ref 3.87–5.11)
RDW: 18.6 % — AB (ref 11.5–15.5)
WBC: 4.3 10*3/uL (ref 4.0–10.5)

## 2018-03-15 LAB — RENAL FUNCTION PANEL
ALBUMIN: 2.9 g/dL — AB (ref 3.5–5.0)
ANION GAP: 9 (ref 5–15)
BUN: 50 mg/dL — AB (ref 8–23)
CALCIUM: 7.9 mg/dL — AB (ref 8.9–10.3)
CO2: 26 mmol/L (ref 22–32)
Chloride: 99 mmol/L (ref 98–111)
Creatinine, Ser: 5.16 mg/dL — ABNORMAL HIGH (ref 0.44–1.00)
GFR calc Af Amer: 9 mL/min — ABNORMAL LOW (ref >60)
GFR calc non Af Amer: 8 mL/min — ABNORMAL LOW (ref >60)
Glucose, Bld: 148 mg/dL — ABNORMAL HIGH (ref 70–99)
PHOSPHORUS: 3.5 mg/dL (ref 2.5–4.6)
Potassium: 4 mmol/L (ref 3.5–5.1)
SODIUM: 134 mmol/L — AB (ref 135–145)

## 2018-03-15 LAB — GLUCOSE, CAPILLARY: Glucose-Capillary: 204 mg/dL — ABNORMAL HIGH (ref 70–99)

## 2018-03-15 MED ORDER — FAMOTIDINE 20 MG PO TABS: 20.0000 mg | ORAL_TABLET | Freq: Every day | ORAL | 0 refills | Status: DC

## 2018-03-15 MED ORDER — VANCOMYCIN HCL 1000 MG IV SOLR: 750.0000 mg | INTRAVENOUS | Status: DC

## 2018-03-15 MED ORDER — PREDNISONE 20 MG PO TABS: 40.0000 mg | ORAL_TABLET | Freq: Every day | ORAL | Status: DC

## 2018-03-15 MED ORDER — ADULT MULTIVITAMIN W/MINERALS CH: 1.0000 | ORAL_TABLET | Freq: Every day | ORAL | 0 refills | Status: DC

## 2018-03-15 MED ORDER — CYCLOPHOSPHAMIDE 50 MG PO CAPS: 50.0000 mg | ORAL_CAPSULE | Freq: Every day | ORAL | 0 refills | Status: DC

## 2018-03-15 MED ORDER — CYCLOPHOSPHAMIDE 50 MG PO CAPS
50.0000 mg | ORAL_CAPSULE | Freq: Every day | ORAL | Status: DC
  Filled 2018-03-15: qty 1

## 2018-03-15 MED ORDER — PREDNISONE 20 MG PO TABS: 40.0000 mg | ORAL_TABLET | Freq: Every day | ORAL | 0 refills | Status: DC

## 2018-03-15 MED ORDER — THIAMINE HCL 100 MG PO TABS: 100.0000 mg | ORAL_TABLET | Freq: Every day | ORAL | 0 refills | Status: DC

## 2018-03-15 MED ORDER — VANCOMYCIN HCL IN DEXTROSE 750-5 MG/150ML-% IV SOLN
750.0000 mg | Freq: Once | INTRAVENOUS | Status: AC
  Administered 2018-03-15: 750 mg via INTRAVENOUS
  Filled 2018-03-15: qty 150

## 2018-03-15 MED ORDER — SULFAMETHOXAZOLE-TRIMETHOPRIM 800-160 MG PO TABS: 1.0000 | ORAL_TABLET | ORAL | 0 refills | Status: DC

## 2018-03-15 MED ORDER — VANCOMYCIN HCL IN DEXTROSE 750-5 MG/150ML-% IV SOLN
INTRAVENOUS | Status: AC
  Administered 2018-03-15: 750 mg via INTRAVENOUS
  Filled 2018-03-15: qty 150

## 2018-03-15 MED ORDER — INSULIN GLARGINE 100 UNIT/ML SOLOSTAR PEN: 5.0000 [IU] | PEN_INJECTOR | Freq: Every day | SUBCUTANEOUS | 1 refills | Status: DC

## 2018-03-15 MED ORDER — CEFTAZIDIME 2 G IV SOLR: 2.0000 g | INTRAVENOUS | 0 refills | Status: DC

## 2018-03-15 MED ORDER — DARBEPOETIN ALFA 40 MCG/0.4ML IJ SOSY: 40.0000 ug | PREFILLED_SYRINGE | INTRAMUSCULAR | Status: DC

## 2018-03-15 MED ORDER — SODIUM CHLORIDE 0.9 % IV SOLN
2.0000 g | Freq: Once | INTRAVENOUS | Status: AC
  Administered 2018-03-15: 2 g via INTRAVENOUS
  Filled 2018-03-15 (×2): qty 2

## 2018-03-15 NOTE — Progress Notes (Signed)
Accepted at [place] .1st treatment Tuesday July 09,2019 @11 :15am Schedule and chairtime Tuesday,Thursday,Saturday @12 :00pm.2nd shift

## 2018-03-15 NOTE — Discharge Summary (Signed)
Rhonda Lynch, is a 62 y.o. female  DOB 04-21-1956  MRN [id number].  Admission date:  02/12/2018  Admitting Physician  Vianne Bulls, MD  Discharge Date:  03/15/2018   Primary MD  Patient, No Pcp Per  Recommendations for primary care physician for things to follow:  -Please follow-up on the final results of blood cultures, permacath blood cultures done on 03/14/2018, continue with IV vancomycin and ceftazidime after hemodialysis, stop date per renal discretion. -Immunotherapy management per renal.   Admission Diagnosis  Hypoxia [R09.02] Acute kidney injury ([place]) [N17.9] Anemia, unspecified type [D64.9] Pneumonia of both lungs due to infectious organism, unspecified part of lung [J18.9]   Discharge Diagnosis  Hypoxia [R09.02] Acute kidney injury ([facility]) [N17.9] Anemia, unspecified type [D64.9] Pneumonia of both lungs due to infectious organism, unspecified part of lung [J18.9]    Principal Problem:   PNEUMONIA Active Problems:   ELEVATED BLOOD PRESSURE WITHOUT DIAGNOSIS OF HYPERTENSION   Renal failure   Acute respiratory failure with hypoxia ([facility])   Alcohol abuse   Anemia   Cocaine abuse ([place])   Epigastric pain   Pneumonia   Pulmonary infiltrate   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[facility])   Diffuse pulmonary alveolar hemorrhage   Hypoxemia   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 No pertinent past medical history.  Past Surgical History:  Procedure Laterality Date  . AV FISTULA PLACEMENT Left 03/05/2018   Procedure: ARTERIOVENOUS (AV) FISTULA CREATION  LEFT UPPER EXTREMITY;  Surgeon: [name], MD;  Location: [facility];  Service: Vascular;  Laterality: Left;  . HEMATOMA EVACUATION Left 03/06/2018   Procedure: EVACUATION HEMATOMA LEFT ARM;  Surgeon: Angelia Mould, MD;  Location: [facility];  Service: Vascular;   Laterality: Left;  . IR FLUORO GUIDE CV LINE RIGHT  02/18/2018  . IR FLUORO GUIDE CV LINE RIGHT  02/26/2018  . IR US GUIDE VASC ACCESS RIGHT  02/18/2018       History of present illness and  Hospital Course:     Kindly see H&P for history of present illness and admission details, please review complete Labs, Consult reports and Test reports for all details in brief  HPI  from the history and physical done on the day of admission 02/12/2018 HPI: Rhonda Lynch is a 62 y.o. female with medical history significant for cocaine and alcohol abuse, having rarely seen a physician in the last decade, now presenting to the emergency department for evaluation abdominal pain.  Patient reports 1 week of stabbing pain in the epigastrium and called EMS.  She reports associated nausea without vomiting, and upon direct questioning acknowledges recent shortness of breath and productive cough for the past 1 to 2 weeks.  She reports only minimal alcohol use though family notes she is a heavy daily drinker.  Patient reports that she has not smoked crack in years.  She denies fevers, chills, or chest pain.  Reports that her urine  has become darker recently.  Reports occasional mild headache without change in vision or hearing or focal numbness or weakness.  She denies melena or hematochezia.  ED Course: Upon arrival to the ED, patient is found to be afebrile, saturating low to mid 80s on room air, tachypneic in the 30s, slightly tachycardic, and with blood pressure 190/100.  EKG features normal sinus rhythm and chest x-ray is concerning for confluent airspace disease in the right greater than left lungs.  CT of the abdomen and pelvis is negative for acute intra-abdominal findings, but concerning for confluent groundglass density in the bilateral bases concerning for cryptogenic organizing pneumonia.  Chemistry panel reveals a BUN of 61 and creatinine of 7.58, up from 0.7 and 2011.  CBC features a microcytic anemia with  hemoglobin of 7.3, down from 10.3 on her most recent prior from 2011.  Lactic acid is reassuringly normal, troponin is undetectable, BNP elevated to 226, and UDS positive for cocaine.  Fecal occult blood testing is negative.  Pulmonology/critical care was consulted by the ED physician and recommended medical admission.  Patient was treated with Rocephin, azithromycin, IV Protonix, Dilaudid, and 500 cc normal saline.  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     Hospital Course   62 year old female with past medical history of cocaine/alcohol abuse who was admitted on 02/12/2018 with abdominal pain, nausea, vomiting. She was found to have Streptococcus pneumonia and also had acute kidney injury on presentation. Nephrology was consulted . 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 She has progressed to end-stage renal disease and is currently undergoing dialysis, plasmapheresis and has been started, on steroids and cyclophosphamide. Discharge planning after setting of outpatient dialysis facility. Received AV graft on 6/26/2019with hematoma which was subsequently evacuated.     Acute renal failure>> End  stage renal disease, on hemodialysis -Secondary to rapidly progressive glomerular nephritis, confirmed by biopsy, nephrology on board, status post plasmapheresis for 7 days, currently on dialysis, clipped for Tuesday Thursday Saturday dialysis -Status post left upper extremity aVF by Dr. Ma Rings 6/26, status post hematoma evacuation 03/06/2018 by Dr. Doren Custard, still have some swelling of the left arm, vascular surgery follow-up appreciated,this can be followed as an outpatient.   Fever -Vision with significant fever 101.6 03/14/2018 ,  no evidence of infectious etiology, C. difficile is negative, cultures peripheral and from permacath remains at negative at time of discharge, will be discharged on IV vancomycin and ceftazidime empirically until permacath infection is ruled out, I have discussed with renal, she is to continue on these antibiotics on hemodialysis days Tuesday Thursday and Saturday .   MPO ANCA Rapidly progressive, nephritis -Status post plasmapheresis, Cytoxan has been lowered to 50 mg oral daily on discharge, and currently on prednisone 40 mg oral daily, this is to be tapered further by renal as an outpatient, continue with Bactrim for prophylaxis while on immunosuppressive medicine .  Diabetes mellitus -Globin A1c is 6.8, she will be discharged on Lantus 5 units daily .   Anemia -Anemia of chronic kidney disease, and acute blood loss anemia, -6.6 yesterday, transfused 2 units with good response, -Procrit per renal   Pancytopenia.  -Resolved  Diffuse alveolar hemorrhage versus bronchiolitis obliterans or cryptogenic organizing pneumonia as per the imaging. No active issues at this time. X-ray did not show any obvious findings on 6/27/2019and pulmonary  followed the patient during hospitalization. Currently stable. 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   Sepsis secondary to streptococcal pneumonia: Urine antigen was positive for streptococcus. Blood cultures have been negative. Completed the course of antibiotics. 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 Patient will need to follow-up with infectious disease as outpatient.   History of cocaine abuse: UDS was positive for cocaine on admission. Currently stable. No agitation or anxiety noted  Leukocytosis:WBC trending down. On cyclophosphamide. On steroids. Will need to closely monitor for downtrending WBC.       Discharge Condition:  Stable   Follow  UP  Follow-up Information    Collene Gobble, MD Follow up on 04/02/2018.   Specialty:  Pulmonary Disease Why:  at 415pm  Contact information: [address] [address] [phone]        Melvenia Needles, NP Follow up on 02/27/2018.   Specialty:  Pulmonary Disease Why:  at 1030 am  Contact information: [address] [place] [address] [phone]        [facility] Follow up on 03/25/2018.   Why:  9:10 am for hospital follow up Contact information: [place] [address] [facility] Follow up.   Why:  you can use the pharmacy at this location for medication assistance Contact information: [address] [place] [address] [phone]       [name], MD Follow up in 6 week(s).   Specialties:  Vascular Surgery, Cardiology Why:  office will call Contact information: [place] [address] [place] Follow up.   Specialty:  Home Health Services Why:  [place] information: [place] [address] [phone]             Discharge Instructions  and  Discharge Medications    Discharge Instructions    Discharge instructions   Complete by:  As directed    Follow with Primary MD  in 7 days   Get CBC, CMP,checked  by Primary MD next visit.    Activity: As tolerated with Full fall precautions use walker/cane & assistance as needed   Disposition Home    Diet: Heart Healthy , carbohydrate modified, renal diet with 1200 cc fluid restriction, with feeding assistance and aspiration precautions.  On your next visit with your primary care physician please Get Medicines reviewed and adjusted.   Please request your Prim.MD to go over all Hospital Tests and Procedure/Radiological results at the  follow up, please get all Hospital records sent to your Prim MD by signing hospital release before you go home.   If you experience worsening of your admission symptoms, develop shortness of breath, life threatening emergency, suicidal or homicidal thoughts you must seek medical attention immediately by calling 911 or calling your MD immediately  if symptoms less severe.  You Must read complete instructions/literature along with all the possible adverse reactions/side effects for all the Medicines you take and that have been prescribed to you. Take any new Medicines after you have completely understood and accpet all the possible adverse reactions/side effects.   Do not drive, operating heavy machinery, perform activities at heights, swimming or participation in water activities or provide baby sitting services if your were admitted for  syncope or siezures until you have seen by Primary MD or a Neurologist and advised to do so again.  Do not drive when taking Pain medications.    Do not take more than prescribed Pain, Sleep and Anxiety Medications  Special Instructions: If you have smoked or chewed Tobacco  in the last 2 yrs please stop smoking, stop any regular Alcohol  and or any Recreational drug use.  Wear Seat belts while driving.   Please note  You were cared for by a hospitalist during your hospital stay. If you have any questions about your discharge medications or the care you received while you were in the hospital after you are discharged, you can call the unit and asked to speak with the hospitalist on call if the hospitalist that took care of you is not available. Once you are discharged, your primary care physician will handle any further medical issues. Please note that NO REFILLS for any discharge medications will be authorized once you are discharged, as it is imperative that you return to your primary care physician (or establish a relationship with a primary care physician if  you do not have one) for your aftercare needs so that they can reassess your need for medications and monitor your lab values.   Increase activity slowly   Complete by:  As directed      Allergies as of 03/15/2018   No Known Allergies     Medication List    STOP taking these medications   bismuth subsalicylate 301 SW/10XN suspension Commonly known as:  PEPTO BISMOL   ibuprofen 200 MG tablet Commonly known as:  ADVIL,MOTRIN   NYQUIL MULTI-SYMPTOM PO     TAKE these medications   acetaminophen 325 MG tablet Commonly known as:  TYLENOL Take 325-650 mg by mouth every 6 (six) hours as needed (for pain or headaches).   Ceftazidime 2 g Solr injection Commonly known as:  FORTAZ Inject 2 g into the vein Every Tuesday,Thursday,and Saturday with dialysis.   cyclophosphamide 50 MG capsule Commonly known as:  CYTOXAN Take 1 capsule (50 mg total) by mouth daily. Give on an empty stomach 1 hour before or 2 hours after meals. Start taking on:  03/16/2018   Darbepoetin Alfa 40 MCG/0.4ML Sosy injection Commonly known as:  ARANESP Inject 0.4 mLs (40 mcg total) into the vein every Thursday with hemodialysis. Start taking on:  03/20/2018   famotidine 20 MG tablet Commonly known as:  PEPCID Take 1 tablet (20 mg total) by mouth daily. Start taking on:  03/16/2018   Insulin Glargine 100 UNIT/ML Solostar Pen Commonly known as:  LANTUS Inject 5 Units into the skin daily.   multivitamin with minerals Tabs tablet Take 1 tablet by mouth daily. Start taking on:  03/16/2018   predniSONE 20 MG tablet Commonly known as:  DELTASONE Take 2 tablets (40 mg total) by mouth daily with breakfast. Start taking on:  03/16/2018   sulfamethoxazole-trimethoprim 800-160 MG tablet Commonly known as:  BACTRIM DS,SEPTRA DS Take 1 tablet by mouth 3 (three) times a week. Start taking on:  03/17/2018   thiamine 100 MG tablet Take 1 tablet (100 mg total) by mouth daily. Start taking on:  03/16/2018   vancomycin 750 mg  in sodium chloride 0.9 % 150 mL Inject 750 mg into the vein Every Tuesday,Thursday,and Saturday with dialysis.         Diet and Activity recommendation: See Discharge Instructions above   Consults obtained - nephrology, vascular  Major procedures and Radiology Reports - PLEASE review detailed and final reports for all details, in brief -   L AVF 6/26, and L AVF exploration 6/27      Dg Chest 1 View  Result Date: 02/14/2018 CLINICAL DATA:  Shortness of breath EXAM: CHEST  1 VIEW COMPARISON:  02/12/2018 FINDINGS: Severe diffuse bilateral disease throughout the lungs, stable. Cardiomegaly. No visible effusions or acute bony abnormality. IMPRESSION: Severe bilateral airspace disease could reflect edema/CHF or infection. No change. Electronically Signed   By: Rolm Baptise M.D.   On: 02/14/2018 09:38   US Renal  Result Date: 02/14/2018 CLINICAL DATA:  Acute kidney injury. EXAM: RENAL / URINARY TRACT ULTRASOUND COMPLETE COMPARISON:  CT abdomen pelvis dated February 12, 2018. FINDINGS: Right Kidney: Length: 11.0 cm. Increased echogenicity. No mass or hydronephrosis visualized. Left Kidney: Length: 10.9 cm. Increased echogenicity. No mass or hydronephrosis visualized. Bladder: Decompressed by a Foley catheter. IMPRESSION: Increased renal cortical echogenicity bilaterally, consistent with medical renal disease. Electronically Signed   By: Titus Dubin M.D.   On: 02/14/2018 22:34   Ct Chest High Resolution  Result Date: 02/18/2018 CLINICAL DATA:  62 year old female with history of alcohol and cocaine abuse presenting with dyspnea and productive cough. Abnormal chest x-ray. EXAM: CT CHEST WITHOUT CONTRAST TECHNIQUE: Multidetector CT imaging of the chest was performed following the standard protocol without intravenous contrast. High resolution imaging of the lungs, as well as inspiratory and expiratory imaging, was performed. COMPARISON:  No prior chest CT.  Chest x-ray 02/14/2018. FINDINGS:  Cardiovascular: Heart size is mildly enlarged. There is no significant pericardial fluid, thickening or pericardial calcification. Aortic atherosclerosis. No definite coronary artery calcifications. Mediastinum/Nodes: No pathologically enlarged mediastinal or hilar lymph nodes. Please note that accurate exclusion of hilar adenopathy is limited on noncontrast CT scans. Esophagus is unremarkable in appearance. No axillary lymphadenopathy. Lungs/Pleura: High-resolution images demonstrate widespread areas of ground-glass attenuation and septal thickening, as well as widespread areas of peripheral bronchiolectasis. Subpleural sparing is noted, particularly throughout the mid to lower lungs. These findings have no definitive craniocaudal gradient, but appear slightly more pronounced throughout the mid to upper lungs than the lower lungs. No honeycombing. Inspiratory and expiratory imaging is unremarkable. Linear area of scarring or atelectasis in the periphery of the left lower lobe. Trace left pleural effusion lying dependently. Upper Abdomen: Unremarkable. Musculoskeletal: There are no aggressive appearing lytic or blastic lesions noted in the visualized portions of the skeleton. IMPRESSION: 1. The appearance of the lungs is nonspecific, but there are imaging findings that are suggestive of cryptogenic organizing pneumonia (COP). Other differential considerations include both cardiogenic and noncardiogenic edema, however, this is not strongly favored. Evaluation by Pulmonology is strongly recommended. 2. Aortic atherosclerosis. Aortic Atherosclerosis (ICD10-I70.0). Electronically Signed   By: Vinnie Langton M.D.   On: 02/18/2018 08:37   Ir Fluoro Guide Cv Line Right  Result Date: 02/26/2018 INDICATION: 17 year old with acute kidney injury and on dialysis. Patient currently has a non tunneled dialysis catheter and needs conversion to a tunneled dialysis catheter. EXAM: CONVERSION OF NON TUNNELED DIALYSIS CATHETER  TO A TUNNELED DIALYSIS CATHETER WITH FLUOROSCOPY Physician: Stephan Minister. Anselm Pancoast, MD MEDICATIONS: Ancef 2 g; The antibiotic was administered within an appropriate time interval prior to skin puncture. ANESTHESIA/SEDATION: Versed 1.5 mg IV; Fentanyl 100 mcg IV; Moderate Sedation Time:  20 minutes The patient was continuously monitored during the procedure by the interventional radiology nurse under my direct supervision. FLUOROSCOPY TIME:  Fluoroscopy Time: 18 seconds, 1 mGy COMPLICATIONS: None immediate.  PROCEDURE: The existing right jugular dialysis catheter was evaluated. This catheter was felt to be adequate for conversion to a tunneled catheter. The procedure was explained to the patient. The risks and benefits of the procedure were discussed and the patient's questions were addressed. Informed consent was obtained from the patient. The existing catheter and the right upper chest were prepped and draped in a sterile fashion. Maximal barrier sterile technique was utilized including caps, mask, sterile gowns, sterile gloves, sterile drape, hand hygiene and skin antiseptic. The skin below the right clavicle and the skin around the catheter were anesthetized with 1% lidocaine. A small incision was made below the right clavicle. A subcutaneous tunnel was formed from the incision to the old catheter site. The old catheter was removed over a Amplatz wire. The wire was advanced into the IVC. A 19 cm tip to cuff Palindrome catheter was pulled through the subcutaneous tunnel. Peel-away sheath was placed over the Amplatz wire. The catheter was placed through the peel-away sheath and positioned at the superior cavoatrial junction. Both lumens aspirated and flushed well. The catheter lumens were flushed with appropriate amount of heparin. The catheter was sutured to skin with Prolene suture. The old jugular right access site was closed using 2 interrupted Ethilon sutures. Fluoroscopic images were taken and saved for this procedure.  FINDINGS: Catheter tip at the superior cavoatrial junction. IMPRESSION: Successful conversion of the right jugular catheter to a tunneled dialysis catheter. Right neck sutures can be removed in 10-14 days. Electronically Signed   By: Markus Daft M.D.   On: 02/26/2018 17:16   Ir Fluoro Guide Cv Line Right  Result Date: 02/18/2018 INDICATION: 62 year old female with renal failure in need of hemodialysis EXAM: IR RIGHT FLOURO GUIDE CV LINE; IR ULTRASOUND GUIDANCE VASC ACCESS RIGHT MEDICATIONS: None ANESTHESIA/SEDATION: None FLUOROSCOPY TIME:  Fluoroscopy Time: 0 minutes 6 seconds (0 mGy). COMPLICATIONS: None immediate. PROCEDURE: Informed written consent was obtained from the patient after a thorough discussion of the procedural risks, benefits and alternatives. All questions were addressed. Maximal Sterile Barrier Technique was utilized including caps, mask, sterile gowns, sterile gloves, sterile drape, hand hygiene and skin antiseptic. A timeout was performed prior to the initiation of the procedure. The right internal jugular vein was interrogated with ultrasound and found to be widely patent. An image was obtained and stored for the medical record. Local anesthesia was attained by infiltration with 1% lidocaine. A small dermatotomy was made. Under real-time sonographic guidance, the vessel was punctured with a 21 gauge micropuncture needle. Using standard technique, the initial micro needle was exchanged over a 0.018 micro wire for a transitional 4 [place] micro sheath. The micro sheath was then exchanged over a 0.035 wire for a dilator which was used to dilate the soft tissue tract. A 20 cm 3 lumen non tunneled dialysis catheter was then advanced over the wire and position with the tip in the upper right atrium. The catheter flushed and aspirated with ease. The catheter was flushed with heparinized saline and secured to the skin with 0 Prolene suture. The patient tolerated the procedure well. IMPRESSION:  Successful placement of a non tunneled Trialysis catheter via the right internal jugular vein. The catheter tip is in the upper right atrium and ready for immediate use. Signed, Criselda Peaches, MD [facility] Radiology Electronically Signed   By: Jacqulynn Cadet M.D.   On: 02/18/2018 16:08   Ir US Guide Vasc Access Right  Result Date: 02/18/2018 INDICATION: 63 year old female  with renal failure in need of hemodialysis EXAM: IR RIGHT FLOURO GUIDE CV LINE; IR ULTRASOUND GUIDANCE VASC ACCESS RIGHT MEDICATIONS: None ANESTHESIA/SEDATION: None FLUOROSCOPY TIME:  Fluoroscopy Time: 0 minutes 6 seconds (0 mGy). COMPLICATIONS: None immediate. PROCEDURE: Informed written consent was obtained from the patient after a thorough discussion of the procedural risks, benefits and alternatives. All questions were addressed. Maximal Sterile Barrier Technique was utilized including caps, mask, sterile gowns, sterile gloves, sterile drape, hand hygiene and skin antiseptic. A timeout was performed prior to the initiation of the procedure. The right internal jugular vein was interrogated with ultrasound and found to be widely patent. An image was obtained and stored for the medical record. Local anesthesia was attained by infiltration with 1% lidocaine. A small dermatotomy was made. Under real-time sonographic guidance, the vessel was punctured with a 21 gauge micropuncture needle. Using standard technique, the initial micro needle was exchanged over a 0.018 micro wire for a transitional 4 [place] micro sheath. The micro sheath was then exchanged over a 0.035 wire for a dilator which was used to dilate the soft tissue tract. A 20 cm 3 lumen non tunneled dialysis catheter was then advanced over the wire and position with the tip in the upper right atrium. The catheter flushed and aspirated with ease. The catheter was flushed with heparinized saline and secured to the skin with 0  Prolene suture. The patient tolerated the procedure well. IMPRESSION: Successful placement of a non tunneled Trialysis catheter via the right internal jugular vein. The catheter tip is in the upper right atrium and ready for immediate use. Signed, Criselda Peaches, MD [facility] Electronically Signed   By: Jacqulynn Cadet M.D.   On: 02/18/2018 16:08   Dg Chest Port 1 View  Result Date: 03/14/2018 CLINICAL DATA:  Fever EXAM: PORTABLE CHEST 1 VIEW COMPARISON:  March 06, 2018 FINDINGS: Central catheter tip is in the superior vena cava near the cavoatrial junction. No pneumothorax. There is mild interstitial edema diffusely with minimal pleural effusions bilaterally. No consolidation. Heart is mildly enlarged with pulmonary venous hypertension. No adenopathy. There is aortic atherosclerosis. No bone lesions. IMPRESSION: Central catheter as described without pneumothorax. Cardiomegaly with mild pulmonary venous hypertension consistent with a degree of pulmonary vascular congestion. Mild interstitial edema with minimal pleural effusions. No consolidation. There is aortic atherosclerosis. Aortic Atherosclerosis (ICD10-I70.0). Electronically Signed   By: Lowella Grip III M.D.   On: 03/14/2018 07:30   Dg Chest Port 1 View  Result Date: 03/06/2018 CLINICAL DATA:  Fever. EXAM: PORTABLE CHEST 1 VIEW COMPARISON:  CT 02/17/2018.  Chest x-ray 02/14/2018. FINDINGS: Right IJ dual-lumen catheter with tip over cavoatrial junction. Cardiomegaly. No focal infiltrate. No pleural effusion or pneumothorax. No acute bony abnormality. J IMPRESSION: 1.  Right IJ dual-lumen catheter with tip over cavoatrial junction. 2. No acute cardiopulmonary disease. No evidence of pulmonary infiltrate. Electronically Signed   By: Marcello Moores  Register   On: 03/06/2018 13:47   US Biopsy (kidney)  Result Date: 02/17/2018 CLINICAL DATA:  Acute kidney injury and microscopic hematuria.  Need for renal biopsy. EXAM: ULTRASOUND GUIDED CORE BIOPSY OF LEFT KIDNEY MEDICATIONS: 1.5 mg IV Versed; 25 mcg IV Fentanyl Total Moderate Sedation Time: 17 minutes. [name] level of consciousness and physiologic status were continuously monitored during the procedure by Radiology nursing. The patient was also given 20 mg of IV hydralazine at the beginning of the procedure and prior to needle advancement to treat hypertension. PROCEDURE: The procedure, risks,  benefits, and alternatives were explained to the patient. Questions regarding the procedure were encouraged and answered. The patient understands and consents to the procedure. A time out was performed prior to initiating the procedure. Ultrasound was utilized to image both kidneys. The left flank region was prepped with chlorhexidine in a sterile fashion, and a sterile drape was applied covering the operative field. A sterile gown and sterile gloves were used for the procedure. Local anesthesia was provided with 1% Lidocaine. Core biopsy was performed at the level of lower pole cortex of the left kidney with a 16 gauge needle device. Two separate core biopsy samples were obtained and submitted in saline. Additional ultrasound was performed. COMPLICATIONS: None. FINDINGS: Both kidneys were well visualized by ultrasound. The left kidney was chosen for biopsy. Solid tissue was obtained from the level of the lower pole cortex. Ultrasound shows no evidence of immediate bleeding complication. IMPRESSION: Ultrasound-guided core biopsy performed of the left kidney at the level of lower pole cortex. Electronically Signed   By: Aletta Edouard M.D.   On: 02/17/2018 13:07    Micro Results     Recent Results (from the past 240 hour(s))  Culture, blood (routine x 2)     Status: None   Collection Time: 03/06/18  6:30 PM  Result Value Ref Range Status   Specimen Description BLOOD RIGHT ARM  Final   Special Requests   Final    BOTTLES DRAWN AEROBIC AND  ANAEROBIC Blood Culture adequate volume   Culture   Final    NO GROWTH 5 DAYS Performed at [facility], [address], [place], [place] [address]    Report Status 03/11/2018 FINAL  Final  Culture, blood (routine x 2)     Status: None   Collection Time: 03/06/18  6:37 PM  Result Value Ref Range Status   Specimen Description BLOOD RIGHT ARM  Final   Special Requests   Final    BOTTLES DRAWN AEROBIC AND ANAEROBIC Blood Culture adequate volume   Culture   Final    NO GROWTH 5 DAYS Performed at [facility], [place] [address] [place], [place] [address]    Report Status 03/11/2018 FINAL  Final  Culture, blood (Routine X 2) w Reflex to ID Panel     Status: None (Preliminary result)   Collection Time: 03/14/18  8:11 AM  Result Value Ref Range Status   Specimen Description BLOOD RIGHT ARM  Final   Special Requests   Final    BOTTLES DRAWN AEROBIC AND ANAEROBIC Blood Culture adequate volume   Culture   Final    NO GROWTH < 24 HOURS Performed at [facility], [place] [address], [place], [place] [address]    Report Status PENDING  Incomplete  Culture, blood (Routine X 2) w Reflex to ID Panel     Status: None (Preliminary result)   Collection Time: 03/14/18  8:25 AM  Result Value Ref Range Status   Specimen Description BLOOD RIGHT HAND  Final   Special Requests   Final    BOTTLES DRAWN AEROBIC AND ANAEROBIC Blood Culture adequate volume   Culture   Final    NO GROWTH < 24 HOURS Performed at [facility], [place] [address], [place], [place] [address]    Report Status PENDING  Incomplete  C difficile quick scan w PCR reflex     Status: None   Collection Time: 03/14/18  9:24 AM  Result Value Ref Range Status  C Diff antigen NEGATIVE NEGATIVE Final   C Diff toxin NEGATIVE NEGATIVE Final   C Diff interpretation No C. difficile detected.  Final    Comment: Performed at [facility], [place] [address] [place], [place] [address]  Culture, blood (single)     Status:  None (Preliminary result)   Collection Time: 03/14/18  4:39 PM  Result Value Ref Range Status   Specimen Description BLOOD CENTRAL LINE  Final   Special Requests   Final    BOTTLES DRAWN AEROBIC AND ANAEROBIC Blood Culture results may not be optimal due to an inadequate volume of blood received in culture bottles   Culture   Final    NO GROWTH < 24 HOURS Performed at [facility], [place] [address], [place], [place] [address]    Report Status PENDING  Incomplete  Culture, blood (single)     Status: None (Preliminary result)   Collection Time: 03/14/18  4:39 PM  Result Value Ref Range Status   Specimen Description BLOOD CENTRAL LINE  Final   Special Requests   Final    BOTTLES DRAWN AEROBIC AND ANAEROBIC Blood Culture adequate volume   Culture   Final    NO GROWTH < 24 HOURS Performed at [facility], [place] [address], [place], [place] [address]    Report Status PENDING  Incomplete       Today   Subjective:   Alethea Terhaar today has no headache,no chest or abdominal pain, no fever, no further diarrhea .o Objective:   Blood pressure (!) 169/87, pulse 60, temperature 98.5 F (36.9 C), temperature source Oral, resp. rate 20, height 5\' 1"  (1.549 m), weight 72.8 kg (160 lb 7.9 oz), SpO2 100 %.   Intake/Output Summary (Last 24 hours) at 03/15/2018 1225 Last data filed at 03/15/2018 1120 Gross per 24 hour  Intake 480 ml  Output 2000 ml  Net -1520 ml    Exam Awake Alert, Oriented x 3, No new F.N deficits, Normal affect Symmetrical Chest wall movement, Good air movement bilaterally, CTAB RRR,No Gallops,Rubs or new Murmurs, No Parasternal Heave +ve B.Sounds, Abd Soft, Non tender, No rebound -guarding or rigidity. Permacath looks clean in the right upper chest, left arm swelling around the fistula site, improving  Data Review   CBC w Diff:  Lab Results  Component Value Date   WBC 4.3 Dec 19, 202019   HGB 8.4 (L) Dec 19, 202019   HCT 25.6 (L) Dec 19, 202019   PLT 117 (L)  Dec 19, 202019   LYMPHOPCT 10 03/05/2018   MONOPCT 6 03/05/2018   EOSPCT 0 03/05/2018   BASOPCT 0 03/05/2018    CMP:  Lab Results  Component Value Date   NA 134 (L) Dec 19, 202019   K 4.0 Dec 19, 202019   CL 99 Dec 19, 202019   CO2 26 Dec 19, 202019   BUN 50 (H) Dec 19, 202019   CREATININE 5.16 (H) Dec 19, 202019   PROT 4.8 (L) 03/12/2018   ALBUMIN 2.9 (L) Dec 19, 202019   BILITOT 0.8 03/12/2018   ALKPHOS 57 03/12/2018   AST 26 03/12/2018   ALT 15 03/12/2018  .   Total Time in preparing paper work, data evaluation and todays exam - 41 minutes  Phillips Climes M.D on 03/15/2018 at 12:25 PM  [facility]   Office  [phone]

## 2018-03-15 NOTE — Progress Notes (Signed)
Patient is back on the unit from hemodialysis.

## 2018-03-15 NOTE — Plan of Care (Signed)
Discussed plan of care with patient.  Explained to patient she must now start administering insulin to herself in the event she will need to receive insulin after she is discharged.  Patient stated "I know how to give myself insulin shots."  However, she did not want to do it this time.  Patient did not display any teach back.

## 2018-03-15 NOTE — Procedures (Signed)
I was present at this dialysis session. I have reviewed the session itself and made appropriate changes.  She has remained afebrile and mentation is at baseline.  She feels well and is without new complaints but wants to go home.  Filed Weights   03/14/18 0353 03/15/18 0606 03/15/18 0651  Weight: 76.1 kg (167 lb 12.3 oz) 76.4 kg (168 lb 6.9 oz) 74.5 kg (164 lb 3.9 oz)    Recent Labs  Lab 03/13/18 0757  NA 136  K 4.1  CL 100  CO2 28  GLUCOSE 108*  BUN 67*  CREATININE 5.01*  CALCIUM 8.2*  PHOS 3.8    Recent Labs  Lab 03/13/18 0757 03/14/18 0811 03/15/18 0749  WBC 6.2 4.2 4.3  HGB 8.7* 10.0* 8.4*  HCT 26.4* 30.4* 25.6*  MCV 81.0 81.9 81.8  PLT 121* 122* 117*    Scheduled Meds: . Chlorhexidine Gluconate Cloth  6 each Topical Q0600  . cyclophosphamide  50 mg Oral Daily  . darbepoetin (ARANESP) injection - DIALYSIS  40 mcg Intravenous Q Thu-HD  . famotidine  20 mg Oral Daily  . heparin injection (subcutaneous)  5,000 Units Subcutaneous Q8H  . insulin aspart  0-9 Units Subcutaneous TID WC  . insulin aspart  3 Units Subcutaneous TID WC  . insulin glargine  5 Units Subcutaneous QHS  . multivitamin with minerals  1 tablet Oral Daily  . polyethylene glycol  17 g Oral Daily  . [START ON 03/16/2018] predniSONE  40 mg Oral Q breakfast  . sodium chloride flush  3 mL Intravenous Q12H  . sulfamethoxazole-trimethoprim  1 tablet Oral Once per day on Mon Wed Fri  . thiamine  100 mg Oral Daily   Continuous Infusions: . sodium chloride 10 mL/hr at 03/05/18 1341  . sodium chloride 10 mL/hr at 03/06/18 1429  . cefTAZidime (FORTAZ)  IV    . vancomycin     PRN Meds:.acetaminophen, alum & mag hydroxide-simeth, haloperidol lactate, hydrALAZINE, HYDROcodone-acetaminophen, HYDROmorphone (DILAUDID) injection, LORazepam, ondansetron **OR** ondansetron (ZOFRAN) IV, senna-docusate, sodium chloride flush, zolpidem    Assessment/Plan: 1. Fevers and malaise- Tmax 101.6, will need to check blood  and urine cultures.  Will empirically start Vanc/Fortaz in case of HD Catheter related bacteremia, although no drainage or tenderness. 1. Blood cultures drawn, C diff quick scan negative 2. Order urine culture and sens pending 3. Redose today with vanco 750mg  and fortaz 2 grams and next dose 03/18/18 at [facility] and follow cultures. 4. Will decrease cytoxan to 50mg  daily and prednisone 40mg  daily.  Will need to be followed at [facility] or with Dr. Carolin Sicks for tapering of meds. 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  1. Decrease cytoxan from 100mg  to 50 mg daily and follow WBC weekly at [facility] 2. bactrim for PCP prophylaxis  3. cont with prednisone taper.Will decrease to 40mg . 4. Pt to follow up with Dr. Carolin Sicks as an outpatient as well or can be managed at her HD unit once established, however if fevers recur, may need to stop immunosuppression. 3. ESRD- outpatient HD at [facility] TTS second shift.  Looks good and stable for discharge 4. ABLA/Anemiaof chronic disease:pt with significant drop in Hgband transfused2 uPRBC's and cont with ESA. Hgb stable. 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 Nutrition:renal diet 7. Hypertension:stable 8. Thrombocytopenia- ? Related to cytoxan or possibly heparin, will send off HIT panel and follow 9. Vascular access- s/p LUE AVF placed 03/05/18  by Dr. Donnetta Hutching s/p hematoma evacuation on 03/06/18 by Dr. 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 Donnetta Hutching. 10. Disposition- hopeful discharge to home today and f/u at [facility] on Tuesday by 11:15 am.  Donetta Potts,  MD 03/15/2018, 8:50 AM

## 2018-03-18 ENCOUNTER — Telehealth: Payer: Self-pay | Admitting: *Deleted

## 2018-03-18 NOTE — Telephone Encounter (Signed)
Pharmacy called related to inactive [facility] card.  EDCM researched [facility] system to find that pt effective date was entered incorrectly.  EDCM corrected card and stayed on phone to insure card was active. Pt was able to purchase Rx.

## 2018-03-19 ENCOUNTER — Emergency Department (HOSPITAL_COMMUNITY): Payer: Medicaid Other

## 2018-03-19 ENCOUNTER — Other Ambulatory Visit: Payer: Self-pay

## 2018-03-19 ENCOUNTER — Inpatient Hospital Stay (HOSPITAL_COMMUNITY)
Admission: EM | Admit: 2018-03-19 | Discharge: 2018-03-24 | DRG: 196 | Disposition: A | Discharge status: Home Health | Payer: Medicaid Other | Attending: Family Medicine | Admitting: Family Medicine

## 2018-03-19 DIAGNOSIS — J154 Pneumonia due to other streptococci: Secondary | ICD-10-CM | POA: Diagnosis present

## 2018-03-19 DIAGNOSIS — J810 Acute pulmonary edema: Secondary | ICD-10-CM

## 2018-03-19 DIAGNOSIS — Z6828 Body mass index (BMI) 28.0-28.9, adult: Secondary | ICD-10-CM

## 2018-03-19 DIAGNOSIS — N2581 Secondary hyperparathyroidism of renal origin: Secondary | ICD-10-CM | POA: Diagnosis present

## 2018-03-19 DIAGNOSIS — F1410 Cocaine abuse, uncomplicated: Secondary | ICD-10-CM | POA: Diagnosis present

## 2018-03-19 DIAGNOSIS — Z978 Presence of other specified devices: Secondary | ICD-10-CM | POA: Diagnosis present

## 2018-03-19 DIAGNOSIS — K2920 Alcoholic gastritis without bleeding: Secondary | ICD-10-CM | POA: Diagnosis present

## 2018-03-19 DIAGNOSIS — J9601 Acute respiratory failure with hypoxia: Secondary | ICD-10-CM | POA: Diagnosis present

## 2018-03-19 DIAGNOSIS — F1010 Alcohol abuse, uncomplicated: Secondary | ICD-10-CM | POA: Diagnosis not present

## 2018-03-19 DIAGNOSIS — Z79899 Other long term (current) drug therapy: Secondary | ICD-10-CM

## 2018-03-19 DIAGNOSIS — B182 Chronic viral hepatitis C: Secondary | ICD-10-CM | POA: Diagnosis present

## 2018-03-19 DIAGNOSIS — E669 Obesity, unspecified: Secondary | ICD-10-CM | POA: Diagnosis present

## 2018-03-19 DIAGNOSIS — N059 Unspecified nephritic syndrome with unspecified morphologic changes: Secondary | ICD-10-CM | POA: Diagnosis present

## 2018-03-19 DIAGNOSIS — R2681 Unsteadiness on feet: Secondary | ICD-10-CM

## 2018-03-19 DIAGNOSIS — J8489 Other specified interstitial pulmonary diseases: Secondary | ICD-10-CM | POA: Diagnosis not present

## 2018-03-19 DIAGNOSIS — F17210 Nicotine dependence, cigarettes, uncomplicated: Secondary | ICD-10-CM | POA: Diagnosis present

## 2018-03-19 DIAGNOSIS — R7989 Other specified abnormal findings of blood chemistry: Secondary | ICD-10-CM | POA: Diagnosis present

## 2018-03-19 DIAGNOSIS — N186 End stage renal disease: Secondary | ICD-10-CM | POA: Diagnosis present

## 2018-03-19 DIAGNOSIS — I288 Other diseases of pulmonary vessels: Secondary | ICD-10-CM | POA: Diagnosis present

## 2018-03-19 DIAGNOSIS — D631 Anemia in chronic kidney disease: Secondary | ICD-10-CM | POA: Diagnosis present

## 2018-03-19 DIAGNOSIS — Z992 Dependence on renal dialysis: Secondary | ICD-10-CM

## 2018-03-19 DIAGNOSIS — T387X5A Adverse effect of androgens and anabolic congeners, initial encounter: Secondary | ICD-10-CM | POA: Diagnosis present

## 2018-03-19 DIAGNOSIS — J9600 Acute respiratory failure, unspecified whether with hypoxia or hypercapnia: Secondary | ICD-10-CM

## 2018-03-19 DIAGNOSIS — N179 Acute kidney failure, unspecified: Secondary | ICD-10-CM | POA: Diagnosis present

## 2018-03-19 DIAGNOSIS — Z794 Long term (current) use of insulin: Secondary | ICD-10-CM

## 2018-03-19 DIAGNOSIS — G4700 Insomnia, unspecified: Secondary | ICD-10-CM | POA: Diagnosis present

## 2018-03-19 DIAGNOSIS — R51 Headache: Secondary | ICD-10-CM | POA: Diagnosis present

## 2018-03-19 DIAGNOSIS — T380X5A Adverse effect of glucocorticoids and synthetic analogues, initial encounter: Secondary | ICD-10-CM | POA: Diagnosis present

## 2018-03-19 DIAGNOSIS — R748 Abnormal levels of other serum enzymes: Secondary | ICD-10-CM | POA: Diagnosis not present

## 2018-03-19 DIAGNOSIS — Y95 Nosocomial condition: Secondary | ICD-10-CM | POA: Diagnosis present

## 2018-03-19 DIAGNOSIS — R778 Other specified abnormalities of plasma proteins: Secondary | ICD-10-CM | POA: Diagnosis present

## 2018-03-19 DIAGNOSIS — D62 Acute posthemorrhagic anemia: Secondary | ICD-10-CM | POA: Diagnosis present

## 2018-03-19 DIAGNOSIS — E1122 Type 2 diabetes mellitus with diabetic chronic kidney disease: Secondary | ICD-10-CM | POA: Diagnosis present

## 2018-03-19 DIAGNOSIS — Z9981 Dependence on supplemental oxygen: Secondary | ICD-10-CM

## 2018-03-19 DIAGNOSIS — D696 Thrombocytopenia, unspecified: Secondary | ICD-10-CM | POA: Diagnosis present

## 2018-03-19 LAB — CULTURE, BLOOD (SINGLE)
CULTURE: NO GROWTH
Culture: NO GROWTH
SPECIAL REQUESTS: ADEQUATE

## 2018-03-19 LAB — COMPREHENSIVE METABOLIC PANEL
ALK PHOS: 54 U/L (ref 38–126)
ALT: 20 U/L (ref 0–44)
ANION GAP: 12 (ref 5–15)
AST: 20 U/L (ref 15–41)
Albumin: 2.9 g/dL — ABNORMAL LOW (ref 3.5–5.0)
BUN: 38 mg/dL — ABNORMAL HIGH (ref 8–23)
CALCIUM: 7.7 mg/dL — AB (ref 8.9–10.3)
CHLORIDE: 104 mmol/L (ref 98–111)
CO2: 26 mmol/L (ref 22–32)
Creatinine, Ser: 4.92 mg/dL — ABNORMAL HIGH (ref 0.44–1.00)
GFR calc non Af Amer: 8 mL/min — ABNORMAL LOW (ref >60)
GFR, EST AFRICAN AMERICAN: 10 mL/min — AB (ref >60)
GLUCOSE: 212 mg/dL — AB (ref 70–99)
POTASSIUM: 3.8 mmol/L (ref 3.5–5.1)
SODIUM: 142 mmol/L (ref 135–145)
Total Bilirubin: 1 mg/dL (ref 0.3–1.2)
Total Protein: 5.1 g/dL — ABNORMAL LOW (ref 6.5–8.1)

## 2018-03-19 LAB — URINALYSIS, ROUTINE W REFLEX MICROSCOPIC
Bilirubin Urine: NEGATIVE
Glucose, UA: 50 mg/dL — AB
KETONES UR: NEGATIVE mg/dL
Nitrite: NEGATIVE
Specific Gravity, Urine: 1.014 (ref 1.005–1.030)
pH: 6 (ref 5.0–8.0)

## 2018-03-19 LAB — CBC WITH DIFFERENTIAL/PLATELET
ABS IMMATURE GRANULOCYTES: 0.1 10*3/uL (ref 0.0–0.1)
BASOS PCT: 0 %
Basophils Absolute: 0 10*3/uL (ref 0.0–0.1)
Eosinophils Absolute: 0 10*3/uL (ref 0.0–0.7)
Eosinophils Relative: 0 %
HCT: 24.2 % — ABNORMAL LOW (ref 36.0–46.0)
HEMOGLOBIN: 7.9 g/dL — AB (ref 12.0–15.0)
IMMATURE GRANULOCYTES: 1 %
LYMPHS PCT: 4 %
Lymphs Abs: 0.3 10*3/uL — ABNORMAL LOW (ref 0.7–4.0)
MCH: 27 pg (ref 26.0–34.0)
MCHC: 32.6 g/dL (ref 30.0–36.0)
MCV: 82.6 fL (ref 78.0–100.0)
MONO ABS: 0.4 10*3/uL (ref 0.1–1.0)
Monocytes Relative: 7 %
NEUTROS ABS: 5.5 10*3/uL (ref 1.7–7.7)
NEUTROS PCT: 88 %
PLATELETS: 129 10*3/uL — AB (ref 150–400)
RBC: 2.93 MIL/uL — ABNORMAL LOW (ref 3.87–5.11)
RDW: 19.2 % — ABNORMAL HIGH (ref 11.5–15.5)
WBC: 6.1 10*3/uL (ref 4.0–10.5)

## 2018-03-19 LAB — CULTURE, BLOOD (ROUTINE X 2)
Culture: NO GROWTH
Culture: NO GROWTH
SPECIAL REQUESTS: ADEQUATE
Special Requests: ADEQUATE

## 2018-03-19 LAB — I-STAT CHEM 8, ED
BUN: 37 mg/dL — AB (ref 8–23)
CREATININE: 5.1 mg/dL — AB (ref 0.44–1.00)
Calcium, Ion: 0.99 mmol/L — ABNORMAL LOW (ref 1.15–1.40)
Chloride: 102 mmol/L (ref 98–111)
GLUCOSE: 212 mg/dL — AB (ref 70–99)
HEMATOCRIT: 24 % — AB (ref 36.0–46.0)
Hemoglobin: 8.2 g/dL — ABNORMAL LOW (ref 12.0–15.0)
Potassium: 3.7 mmol/L (ref 3.5–5.1)
Sodium: 140 mmol/L (ref 135–145)
TCO2: 27 mmol/L (ref 22–32)

## 2018-03-19 LAB — RAPID URINE DRUG SCREEN, HOSP PERFORMED
Amphetamines: NOT DETECTED
Benzodiazepines: NOT DETECTED
COCAINE: NOT DETECTED
OPIATES: NOT DETECTED
Tetrahydrocannabinol: NOT DETECTED

## 2018-03-19 LAB — I-STAT TROPONIN, ED: Troponin i, poc: 0.16 ng/mL (ref 0.00–0.08)

## 2018-03-19 LAB — I-STAT CG4 LACTIC ACID, ED
LACTIC ACID, VENOUS: 1.15 mmol/L (ref 0.5–1.9)
Lactic Acid, Venous: 0.87 mmol/L (ref 0.5–1.9)

## 2018-03-19 LAB — GLUCOSE, CAPILLARY: Glucose-Capillary: 158 mg/dL — ABNORMAL HIGH (ref 70–99)

## 2018-03-19 LAB — LIPASE, BLOOD: Lipase: 24 U/L (ref 11–51)

## 2018-03-19 LAB — CBG MONITORING, ED: Glucose-Capillary: 157 mg/dL — ABNORMAL HIGH (ref 70–99)

## 2018-03-19 MED ORDER — ACETAMINOPHEN 650 MG RE SUPP: 650.0000 mg | Freq: Four times a day (QID) | RECTAL | Status: DC | PRN

## 2018-03-19 MED ORDER — FAMOTIDINE 20 MG PO TABS
20.0000 mg | ORAL_TABLET | Freq: Every day | ORAL | Status: DC
  Administered 2018-03-20: 20 mg via ORAL
  Filled 2018-03-19: qty 1

## 2018-03-19 MED ORDER — LORAZEPAM 2 MG/ML IJ SOLN: 1.0000 mg | Freq: Four times a day (QID) | INTRAMUSCULAR | Status: AC | PRN

## 2018-03-19 MED ORDER — ONDANSETRON HCL 4 MG/2ML IJ SOLN: 4.0000 mg | Freq: Four times a day (QID) | INTRAMUSCULAR | Status: DC | PRN

## 2018-03-19 MED ORDER — LORAZEPAM 1 MG PO TABS
1.0000 mg | ORAL_TABLET | Freq: Four times a day (QID) | ORAL | Status: AC | PRN
  Administered 2018-03-20 – 2018-03-21 (×2): 1 mg via ORAL
  Filled 2018-03-19 (×2): qty 1

## 2018-03-19 MED ORDER — CEFTAZIDIME 2 G IV SOLR: 2.0000 g | INTRAVENOUS | Status: DC

## 2018-03-19 MED ORDER — FOLIC ACID 1 MG PO TABS
1.0000 mg | ORAL_TABLET | Freq: Every day | ORAL | Status: DC
  Administered 2018-03-20 – 2018-03-24 (×5): 1 mg via ORAL
  Filled 2018-03-19 (×5): qty 1

## 2018-03-19 MED ORDER — ADULT MULTIVITAMIN W/MINERALS CH: 1.0000 | ORAL_TABLET | Freq: Every day | ORAL | Status: DC

## 2018-03-19 MED ORDER — PREDNISONE 20 MG PO TABS
40.0000 mg | ORAL_TABLET | Freq: Every day | ORAL | Status: DC
  Administered 2018-03-20 – 2018-03-21 (×2): 40 mg via ORAL
  Filled 2018-03-19 (×3): qty 2

## 2018-03-19 MED ORDER — HEPARIN (PORCINE) IN NACL 100-0.45 UNIT/ML-% IJ SOLN
1000.0000 [IU]/h | INTRAMUSCULAR | Status: DC
  Administered 2018-03-19: 1150 [IU]/h via INTRAVENOUS
  Filled 2018-03-19: qty 250

## 2018-03-19 MED ORDER — ONDANSETRON HCL 4 MG/2ML IJ SOLN
4.0000 mg | Freq: Once | INTRAMUSCULAR | Status: AC
  Administered 2018-03-19: 4 mg via INTRAVENOUS
  Filled 2018-03-19: qty 2

## 2018-03-19 MED ORDER — ACETAMINOPHEN 325 MG PO TABS: 650.0000 mg | ORAL_TABLET | Freq: Four times a day (QID) | ORAL | Status: DC | PRN

## 2018-03-19 MED ORDER — CYCLOPHOSPHAMIDE 50 MG PO CAPS
50.0000 mg | ORAL_CAPSULE | Freq: Every day | ORAL | Status: DC
  Administered 2018-03-20 – 2018-03-21 (×2): 50 mg via ORAL
  Filled 2018-03-19 (×2): qty 1

## 2018-03-19 MED ORDER — THIAMINE HCL 100 MG/ML IJ SOLN: 100.0000 mg | Freq: Every day | INTRAMUSCULAR | Status: DC

## 2018-03-19 MED ORDER — SULFAMETHOXAZOLE-TRIMETHOPRIM 800-160 MG PO TABS
1.0000 | ORAL_TABLET | ORAL | Status: DC
  Administered 2018-03-21 – 2018-03-24 (×2): 1 via ORAL
  Filled 2018-03-19 (×2): qty 1

## 2018-03-19 MED ORDER — DARBEPOETIN ALFA 40 MCG/0.4ML IJ SOSY
40.0000 ug | PREFILLED_SYRINGE | INTRAMUSCULAR | Status: DC
  Administered 2018-03-20: 40 ug via INTRAVENOUS
  Filled 2018-03-19: qty 0.4

## 2018-03-19 MED ORDER — ONDANSETRON HCL 4 MG PO TABS: 4.0000 mg | ORAL_TABLET | Freq: Four times a day (QID) | ORAL | Status: DC | PRN

## 2018-03-19 MED ORDER — INSULIN GLARGINE 100 UNIT/ML ~~LOC~~ SOLN
5.0000 [IU] | Freq: Every day | SUBCUTANEOUS | Status: DC
  Administered 2018-03-20 – 2018-03-23 (×4): 5 [IU] via SUBCUTANEOUS
  Filled 2018-03-19 (×5): qty 0.05

## 2018-03-19 MED ORDER — VANCOMYCIN HCL 1000 MG IV SOLR
750.0000 mg | INTRAVENOUS | Status: DC
  Filled 2018-03-19: qty 750

## 2018-03-19 MED ORDER — HYDROCODONE-ACETAMINOPHEN 5-325 MG PO TABS
1.0000 | ORAL_TABLET | Freq: Four times a day (QID) | ORAL | Status: DC | PRN
  Administered 2018-03-19 – 2018-03-22 (×6): 1 via ORAL
  Filled 2018-03-19 (×7): qty 1

## 2018-03-19 MED ORDER — HYDROMORPHONE HCL 1 MG/ML IJ SOLN
0.5000 mg | Freq: Once | INTRAMUSCULAR | Status: AC
  Administered 2018-03-19: 0.5 mg via INTRAVENOUS
  Filled 2018-03-19: qty 1

## 2018-03-19 MED ORDER — HEPARIN BOLUS VIA INFUSION
4000.0000 [IU] | Freq: Once | INTRAVENOUS | Status: AC
  Administered 2018-03-19: 4000 [IU] via INTRAVENOUS
  Filled 2018-03-19: qty 4000

## 2018-03-19 MED ORDER — INSULIN ASPART 100 UNIT/ML ~~LOC~~ SOLN
0.0000 [IU] | SUBCUTANEOUS | Status: DC
  Administered 2018-03-20: 3 [IU] via SUBCUTANEOUS
  Administered 2018-03-20 (×2): 2 [IU] via SUBCUTANEOUS
  Administered 2018-03-20: 1 [IU] via SUBCUTANEOUS
  Administered 2018-03-21: 3 [IU] via SUBCUTANEOUS
  Administered 2018-03-21: 1 [IU] via SUBCUTANEOUS
  Administered 2018-03-21: 5 [IU] via SUBCUTANEOUS
  Administered 2018-03-21: 3 [IU] via SUBCUTANEOUS
  Administered 2018-03-22 (×2): 2 [IU] via SUBCUTANEOUS
  Administered 2018-03-22: 5 [IU] via SUBCUTANEOUS
  Administered 2018-03-22: 7 [IU] via SUBCUTANEOUS
  Administered 2018-03-22: 1 [IU] via SUBCUTANEOUS
  Administered 2018-03-23 (×2): 5 [IU] via SUBCUTANEOUS
  Administered 2018-03-23: 2 [IU] via SUBCUTANEOUS
  Administered 2018-03-23: 7 [IU] via SUBCUTANEOUS
  Administered 2018-03-23: 5 [IU] via SUBCUTANEOUS
  Administered 2018-03-23: 1 [IU] via SUBCUTANEOUS
  Administered 2018-03-24: 2 [IU] via SUBCUTANEOUS
  Administered 2018-03-24: 6 [IU] via SUBCUTANEOUS

## 2018-03-19 MED ORDER — ADULT MULTIVITAMIN W/MINERALS CH
1.0000 | ORAL_TABLET | Freq: Every day | ORAL | Status: DC
  Administered 2018-03-20 – 2018-03-24 (×5): 1 via ORAL
  Filled 2018-03-19 (×5): qty 1

## 2018-03-19 MED ORDER — SODIUM CHLORIDE 0.9 % IV SOLN
2.0000 g | INTRAVENOUS | Status: DC
  Administered 2018-03-20: 2 g via INTRAVENOUS
  Filled 2018-03-19: qty 2

## 2018-03-19 MED ORDER — THIAMINE HCL 100 MG PO TABS: 100.0000 mg | ORAL_TABLET | Freq: Every day | ORAL | Status: DC

## 2018-03-19 MED ORDER — VITAMIN B-1 100 MG PO TABS
100.0000 mg | ORAL_TABLET | Freq: Every day | ORAL | Status: DC
  Administered 2018-03-20 – 2018-03-24 (×5): 100 mg via ORAL
  Filled 2018-03-19 (×5): qty 1

## 2018-03-19 NOTE — ED Notes (Signed)
MD notified critical istat trop

## 2018-03-19 NOTE — ED Triage Notes (Signed)
Pt arrived from home via [facility] c/o abdominal pain. Pt was recently discharged from [facility] after being hospitalized for pneumonia and kidney failure. Recent dialysis shunt placement. Pt states pain is 10/10. Pt is alert and oriented x4. 143/89, 87%ra, cbg 208.

## 2018-03-19 NOTE — ED Notes (Signed)
Pt c/o increasing pain to epigastric area. Admitting MD notified.

## 2018-03-19 NOTE — ED Notes (Signed)
Patient transported to CT 

## 2018-03-19 NOTE — Progress Notes (Signed)
ANTICOAGULATION CONSULT NOTE - Initial Consult  Pharmacy Consult for Heparin Indication: pulmonary embolus  No Known Allergies  Patient Measurements: Weight 72.8 kg IBW 48 kg Heparin Dosing Weight: 63.8 kg  Vital Signs: Temp: 98.2 F (36.8 C) (07/10 1548) Temp Source: Oral (07/10 1548) BP: 155/91 (07/10 2000) Pulse Rate: 78 (07/10 2000)  Labs: Recent Labs    03/19/18 1820 03/19/18 1834  HGB 7.9* 8.2*  HCT 24.2* 24.0*  PLT 129*  --   CREATININE 4.92* 5.10*    Estimated Creatinine Clearance: 10.2 mL/min (A) (by C-G formula based on SCr of 5.1 mg/dL (H)).   Medical History: No past medical history on file.  Medications:  No anticoagulation pta  Assessment: 62 year old female who presented elevated troponin and swollen LE  to start IV Heparin for pulmonary embolism.  Patient denies chest pain. New LE swollen - unable to CT Angio. Discussed case with Dr. Tyrone Nine. Tunneled cath placed one month ago. Fistula placement 2 weeks prior.   Hgb 8.2- low, stable. Platelets 129 - no bleeding reported. New ESRD on dialysis. SCr 5.10.   Goal of Therapy:  Heparin level 0.3-0.7 units/ml Monitor platelets by anticoagulation protocol: Yes   Plan:  Heparin bolus 4000 units IV x1, then Heparin drip at 1150 units/hr.  Heparin level in 8 hours.  Daily Heparin level and CBC.   Sloan Leiter, PharmD, BCPS, BCCCP Clinical Pharmacist Clinical phone 03/19/2018 until 11:30PM [phone] After hours, please call [phone] 03/19/2018,9:11 PM

## 2018-03-19 NOTE — Progress Notes (Signed)
Pt admitted from ED with c/o of abdominal pain, alert and oriented, still c/o of abd pain with a rating of 7, was just medicated prior to transfer, pt reassured, tele monitor put and verified on pt, safety concern addressed accordingly, will however continue to monitor. Obasogie-Asidi, Ameyah Bangura Efe

## 2018-03-19 NOTE — H&P (Signed)
History and Physical    Rhonda Lynch GQQ:[id number] DOB: [birth year] DOA: 03/19/2018  PCP: Patient, No Pcp Per  Patient coming from: Home  I have personally briefly reviewed patient's old medical records in [facility]  Chief Complaint: SOB  HPI: Rhonda Lynch is a 62 y.o. female with medical history significant of Cocaine and EtOH abuse.  Recent admit to our service for a month long hospital stay this past month, discharged 4 days ago.  See DC summary for details.  Briefly:  62 year old female with past medical history of cocaine/alcohol abuse who was admitted on 02/12/2018 with abdominal pain, nausea, vomiting. She was found to have Streptococcus pneumonia and also had acute kidney injury on presentation. Nephrology was consulted . 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 She has progressed to end-stage renal disease and is currently undergoing dialysis, plasmapheresis and has been started, on steroids and cyclophosphamide. Discharge planning after setting of outpatient dialysis facility. Received AV graft on 6/26/2019with hematoma which was subsequently evacuated.  After discharge.  Underwent dialysis yesterday.  Began to feel increasingly ill with abd pain.  Presents to ED today.   ED Course: CT abd/pelvis w/o acute finding.  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  Did miss yesterdays med doses, got all meds at pharmacy and took todays doses except for cyclosporin which apparently has to be mail ordered.   Review of Systems: As per HPI otherwise 10 point review of systems negative.   No past medical history on file.  Past Surgical History:  Procedure Laterality Date  . AV FISTULA PLACEMENT Left 03/05/2018   Procedure: ARTERIOVENOUS (AV) FISTULA CREATION  LEFT UPPER EXTREMITY;   Surgeon: [name], MD;  Location: [facility];  Service: Vascular;  Laterality: Left;  . HEMATOMA EVACUATION Left 03/06/2018   Procedure: EVACUATION HEMATOMA LEFT ARM;  Surgeon: Angelia Mould, MD;  Location: [facility];  Service: Vascular;  Laterality: Left;  . IR FLUORO GUIDE CV LINE RIGHT  02/18/2018  . IR FLUORO GUIDE CV LINE RIGHT  02/26/2018  . IR US GUIDE VASC ACCESS RIGHT  02/18/2018     reports that she has been smoking.  She has been smoking about 1.00 pack per day. She has never used smokeless tobacco. She reports that she drinks alcohol. She reports that she has current or past drug history. Drug: Cocaine.  No Known Allergies  Family History  Problem Relation Age of Onset  . Autoimmune disease Neg Hx      Prior to Admission medications   Medication Sig Start Date End Date Taking? Authorizing Provider  acetaminophen (TYLENOL) 325 MG tablet Take 325-650 mg by mouth every 6 (six) hours as needed (for pain or headaches).   Yes [provider]  Ceftazidime (FORTAZ) 2 g SOLR injection Inject 2 g into the vein Every Tuesday,Thursday,and Saturday with dialysis. 03/15/18  Yes Elgergawy, Silver Huguenin, MD  cyclophosphamide (CYTOXAN) 50 MG capsule Take 1 capsule (50 mg total) by mouth daily. Give on an empty stomach 1 hour before or 2 hours after meals. 03/16/18  Yes Elgergawy, Silver Huguenin, MD  famotidine (PEPCID) 20 MG tablet Take 1 tablet (20 mg total) by mouth daily. 03/16/18  Yes Elgergawy, Silver Huguenin, MD  Insulin Glargine (LANTUS) 100 UNIT/ML Solostar Pen Inject 5 Units into the skin daily. Patient taking differently: Inject 5 Units into the skin at bedtime.  03/15/18  Yes Elgergawy, Silver Huguenin, MD  Multiple Vitamin (MULTIVITAMIN WITH MINERALS) TABS tablet Take 1 tablet by mouth daily. 03/16/18  Yes Elgergawy, Silver Huguenin, MD  predniSONE (DELTASONE) 20 MG tablet Take 2 tablets (40 mg total) by mouth daily with breakfast. 03/16/18  Yes Elgergawy, Silver Huguenin, MD  sulfamethoxazole-trimethoprim (BACTRIM  DS,SEPTRA DS) 800-160 MG tablet Take 1 tablet by mouth 3 (three) times a week. Patient taking differently: Take 1 tablet by mouth every Monday, Wednesday, and Friday.  03/17/18  Yes Elgergawy, Silver Huguenin, MD  thiamine 100 MG tablet Take 1 tablet (100 mg total) by mouth daily. 03/16/18  Yes Elgergawy, Silver Huguenin, MD  vancomycin 750 mg in sodium chloride 0.9 % 150 mL Inject 750 mg into the vein Every Tuesday,Thursday,and Saturday with dialysis. 03/15/18  Yes Elgergawy, Silver Huguenin, MD  Darbepoetin Alfa (ARANESP) 40 MCG/0.4ML SOSY injection Inject 0.4 mLs (40 mcg total) into the vein every Thursday with hemodialysis. 03/20/18   Elgergawy, Silver Huguenin, MD    Physical Exam: Vitals:   03/19/18 1900 03/19/18 1930 03/19/18 2000 03/19/18 2130  BP: (!) 162/88 (!) 152/87 (!) 155/91 (!) 165/95  Pulse: 66 72 78 70  Resp: 19 14 19 17   Temp:      TempSrc:      SpO2: 96% 95% 99% 100%    Constitutional: NAD, calm, comfortable Eyes: PERRL, lids and conjunctivae normal ENMT: Mucous membranes are moist. Posterior pharynx clear of any exudate or lesions.Normal dentition.  Neck: normal, supple, no masses, no thyromegaly Respiratory: clear to auscultation bilaterally, no wheezing, no crackles. Normal respiratory effort. No accessory muscle use.  Cardiovascular: Regular rate and rhythm, no murmurs / rubs / gallops. No extremity edema. 2+ pedal pulses. No carotid bruits.  Abdomen: no tenderness, no masses palpated. No hepatosplenomegaly. Bowel sounds positive.  Musculoskeletal: no clubbing / cyanosis. No joint deformity upper and lower extremities. Good ROM, no contractures. Normal muscle tone.  Skin: no rashes, lesions, ulcers. No induration Neurologic: CN 2-12 grossly intact. Sensation intact, DTR normal. Strength 5/5 in all 4.  Psychiatric: Normal judgment and insight. Alert and oriented x 3. Normal mood.    Labs on Admission: I have personally reviewed following labs and imaging studies  CBC: Recent Labs  Lab  03/13/18 0757 03/14/18 0811 03/15/18 0749 03/19/18 1820 03/19/18 1834  WBC 6.2 4.2 4.3 6.1  --   NEUTROABS  --   --   --  5.5  --   HGB 8.7* 10.0* 8.4* 7.9* 8.2*  HCT 26.4* 30.4* 25.6* 24.2* 24.0*  MCV 81.0 81.9 81.8 82.6  --   PLT 121* 122* 117* 129*  --    Basic Metabolic Panel: Recent Labs  Lab 03/13/18 0757 03/15/18 0800 03/19/18 1820 03/19/18 1834  NA 136 134* 142 140  K 4.1 4.0 3.8 3.7  CL 100 99 104 102  CO2 28 26 26   --   GLUCOSE 108* 148* 212* 212*  BUN 67* 50* 38* 37*  CREATININE 5.01* 5.16* 4.92* 5.10*  CALCIUM 8.2* 7.9* 7.7*  --   PHOS 3.8 3.5  --   --    GFR: Estimated Creatinine Clearance: 10.2 mL/min (A) (by C-G formula based on SCr of 5.1 mg/dL (H)). Liver Function Tests: Recent Labs  Lab 03/13/18 0757 03/15/18 0800 03/19/18 1820  AST  --   --  20  ALT  --   --  20  ALKPHOS  --   --  54  BILITOT  --   --  1.0  PROT  --   --  5.1*  ALBUMIN 3.0* 2.9* 2.9*   Recent Labs  Lab 03/19/18 1820  LIPASE 24   No results for input(s): AMMONIA in the last 168 hours. Coagulation Profile: No results for input(s): INR, PROTIME in the last 168 hours. Cardiac Enzymes: No results for input(s): CKTOTAL, CKMB, CKMBINDEX, TROPONINI in the last 168 hours. BNP (last 3 results) No results for input(s): PROBNP in the last 8760 hours. HbA1C: No results for input(s): HGBA1C in the last 72 hours. CBG: Recent Labs  Lab 03/14/18 1157 03/14/18 1709 03/14/18 2111 03/15/18 1151 03/19/18 2217  GLUCAP 83 162* 265* 204* 157*   Lipid Profile: No results for input(s): CHOL, HDL, LDLCALC, TRIG, CHOLHDL, LDLDIRECT in the last 72 hours. Thyroid Function Tests: No results for input(s): TSH, T4TOTAL, FREET4, T3FREE, THYROIDAB in the last 72 hours. Anemia Panel: No results for input(s): VITAMINB12, FOLATE, FERRITIN, TIBC, IRON, RETICCTPCT in the last 72 hours. Urine analysis:    Component Value Date/Time   COLORURINE YELLOW 03/19/2018 1738   APPEARANCEUR HAZY (A)  03/19/2018 1738   LABSPEC 1.014 03/19/2018 1738   PHURINE 6.0 03/19/2018 1738   GLUCOSEU 50 (A) 03/19/2018 1738   HGBUR LARGE (A) 03/19/2018 1738   HGBUR trace-intact 03/08/2010 1452   BILIRUBINUR NEGATIVE 03/19/2018 1738   KETONESUR NEGATIVE 03/19/2018 1738   PROTEINUR >=300 (A) 03/19/2018 1738   UROBILINOGEN 0.2 03/08/2010 1452   NITRITE NEGATIVE 03/19/2018 1738   LEUKOCYTESUR TRACE (A) 03/19/2018 1738    Radiological Exams on Admission: Ct Abdomen Pelvis Wo Contrast  Result Date: 03/19/2018 CLINICAL DATA:  62 year old female with reported kidney failure, recently discharged for pneumonia presents with abdominal pain. EXAM: CT ABDOMEN AND PELVIS WITHOUT CONTRAST TECHNIQUE: Multidetector CT imaging of the abdomen and pelvis was performed following the standard protocol without IV contrast. COMPARISON:  02/12/2018 FINDINGS: Lower chest: Cardiomegaly with small bilateral right greater than left pleural effusions. Redemonstration of bilateral bibasilar ground-glass opacities with inter and intra lobular septal thickening in a crazy paving pattern that can be seen with cryptogenic organizing pneumonia. Superimposed stigmata of CHF given presence of bilateral pleural effusions is not excluded. Hepatobiliary: The unenhanced liver is normal given limitations of a noncontrast study. The patient is status post cholecystectomy. Pancreas: Normal Spleen: Normal Adrenals/Urinary Tract: Mild bilateral perinephric fat stranding without obstructive uropathy. The urinary bladder is unremarkable for degree of distention. Stomach/Bowel: The stomach is decompressed. Normal small bowel rotation without obstruction or inflammation. The distal and terminal ileum and appendix are normal. Unremarkable appearance of the colon with average amount of fecal retention noted. Vascular/Lymphatic: Moderate aorto iliac atherosclerosis without aneurysm. No adenopathy. Reproductive: Hysterectomy. Small fat containing dermoid in the  left ovary measuring 15 mm, stable in appearance. Other: No free air nor free fluid. Small fat containing umbilical hernia. Musculoskeletal: No acute or significant osseous findings. Minimal anterolisthesis grade 1 of L4 on L5 due to facet arthropathy. IMPRESSION: 1. Bibasilar ground-glass opacities with interlobular and intralobular septal thickening sparing the subpleural lung. Findings can be seen in cryptogenic organizing pneumonia. Superimposed pulmonary edema would be difficult to entirely exclude given presence of new bilateral small pleural effusions, right greater than left. 2. 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 3. Minimal anterolisthesis grade 1 of L4 on L5 secondary to degenerative facet arthropathy. Electronically Signed   By: Ashley Royalty M.D.   On: 03/19/2018 20:29   Dg Chest 2 View  Result Date: 03/19/2018 CLINICAL DATA:  Abdominal pain.  Recent dialysis shunt  placement. EXAM: CHEST - 2 VIEW COMPARISON:  03/14/2018 and 03/06/2018 FINDINGS: Cardiomegaly with mild aortic atherosclerosis. Diffuse interstitial edema persists with small bilateral pleural effusions. Right IJ dialysis catheter terminates at the cavoatrial junction and is unchanged in appearance. There is pneumothorax. No acute osseous abnormality. IMPRESSION: Stable cardiomegaly with mild aortic atherosclerosis. Diffuse interstitial edema is again noted without significant change. Small bilateral pleural effusions. Electronically Signed   By: Ashley Royalty M.D.   On: 03/19/2018 17:03    EKG: Independently reviewed.  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 Elevated trop / SOB - 1. PE vs demand ischemia from dialysis / cocaine use vs BOOP/ pulm vasculitis 2. Serial trops 3. Tele monitor 4. VQ scan 5. vasc  US 6. Empiric heparin gtt stared by EDP in the mean time 2. MPO ANCA and RPGN - 1. Continue prednisone 2. Continue cytoxan 3. Continue bactrim 3. 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 On immunosuppressives as above 2. Unclear if related to RPGN 4. Cocaine abuse - 1. UDS pending 5. EtOH abuse - 1. CIWA 6. Acute kidney loss requiring dialysis - 1. Routine dialysis tomorrow AM 2. EDP spoke with Dr. Joelyn Oms 3. Avoiding CT contrast given possibility of renal recovery still per Dr. Joelyn Oms 7. Fever at discharge last admit - 1. Patient currently on empiric vanc, fortaz till perm cath infection can be ruled out.  Will leave her on these for the moment.  DVT prophylaxis: Heparin gtt Code Status: Full Family Communication: No family in room Disposition Plan: Home after admit Consults called: EDP spoke with Dr. Joelyn Oms Admission status: Place in [place] [place] [facility] Pager [phone] Only works nights!  If 7AM-7PM, please contact the primary day team physician taking care of patient  [email or web address] Password [facility]  03/19/2018, 10:23 PM

## 2018-03-19 NOTE — ED Notes (Signed)
Pur-wick in place °

## 2018-03-19 NOTE — ED Provider Notes (Signed)
[facility] EMERGENCY DEPARTMENT Provider Note   CSN: [id number] Arrival date & time: 03/19/18  1547     History   Chief Complaint Chief Complaint  Patient presents with  . Abdominal Pain    HPI Rhonda Lynch is a 62 y.o. female.  62 yo F with a chief complaint of feeling terrible.  This been going on for the past 2 or 3 days.  Patient having subjective fevers and chills at home as well as nausea and vomiting and epigastric abdominal pain.  She denies diarrhea denies sick contacts.  Patient 2 weeks ago had a left AV fistula placed when she went into acute renal failure and had a bilateral pneumonia.  The patient feels that she has had worsening shortness of breath as well.  Denies chest pain.  She has had some right lower extremity edema that is been noticed by her over the past couple days.  She was started on oxygen in triage is not typically on oxygen therapy.`  The history is provided by the patient.  Illness  This is a new problem. The current episode started yesterday. The problem occurs constantly. The problem has been gradually worsening. Associated symptoms include abdominal pain and shortness of breath. Pertinent negatives include no chest pain and no headaches. The symptoms are aggravated by eating. Nothing relieves the symptoms. She has tried nothing for the symptoms. The treatment provided no relief.    No past medical history on file.  Patient Active Problem List   Diagnosis Date Noted  . AKI (acute kidney injury) ([place])   . Fever   . 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 Diffuse pulmonary alveolar hemorrhage   . Hypoxemia   . Pulmonary infiltrate   . 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 Renal failure 02/12/2018  . Acute respiratory failure with hypoxia ([place]) 02/12/2018  . Alcohol abuse 02/12/2018  . Anemia 02/12/2018  . Cocaine abuse ([place]) 02/12/2018  . Epigastric pain 02/12/2018  . Pneumonia 02/12/2018  . TOBACCO  ABUSE 03/08/2010  . PNEUMONIA 03/08/2010  . UTI 03/08/2010  . ELEVATED BLOOD PRESSURE WITHOUT DIAGNOSIS OF HYPERTENSION 03/08/2010    Past Surgical History:  Procedure Laterality Date  . AV FISTULA PLACEMENT Left 03/05/2018   Procedure: ARTERIOVENOUS (AV) FISTULA CREATION  LEFT UPPER EXTREMITY;  Surgeon: [name], MD;  Location: [facility];  Service: Vascular;  Laterality: Left;  . HEMATOMA EVACUATION Left 03/06/2018   Procedure: EVACUATION HEMATOMA LEFT ARM;  Surgeon: Angelia Mould, MD;  Location: [facility];  Service: Vascular;  Laterality: Left;  . IR FLUORO GUIDE CV LINE RIGHT  02/18/2018  . IR FLUORO GUIDE CV LINE RIGHT  02/26/2018  . IR US GUIDE VASC ACCESS RIGHT  02/18/2018     OB History   None      Home Medications    Prior to Admission medications   Medication Sig Start Date End Date Taking? Authorizing Provider  acetaminophen (TYLENOL) 325 MG tablet Take 325-650 mg by mouth every 6 (six) hours as needed (for pain or headaches).    [provider]  Ceftazidime (FORTAZ) 2 g SOLR injection Inject 2 g into the vein Every Tuesday,Thursday,and Saturday with dialysis. 03/15/18   Elgergawy, Silver Huguenin, MD  cyclophosphamide (CYTOXAN) 50 MG capsule Take 1 capsule (50 mg total) by mouth daily. Give on an empty stomach 1 hour before or 2 hours after meals. 03/16/18   Elgergawy, Silver Huguenin, MD  Darbepoetin Alfa (ARANESP) 40 MCG/0.4ML  SOSY injection Inject 0.4 mLs (40 mcg total) into the vein every Thursday with hemodialysis. 03/20/18   Elgergawy, Silver Huguenin, MD  famotidine (PEPCID) 20 MG tablet Take 1 tablet (20 mg total) by mouth daily. 03/16/18   Elgergawy, Silver Huguenin, MD  Insulin Glargine (LANTUS) 100 UNIT/ML Solostar Pen Inject 5 Units into the skin daily. 03/15/18   Elgergawy, Silver Huguenin, MD  Multiple Vitamin (MULTIVITAMIN WITH MINERALS) TABS tablet Take 1 tablet by mouth daily. 03/16/18   Elgergawy, Silver Huguenin, MD  predniSONE (DELTASONE) 20 MG tablet Take 2 tablets (40 mg total) by mouth daily  with breakfast. 03/16/18   Elgergawy, Silver Huguenin, MD  sulfamethoxazole-trimethoprim (BACTRIM DS,SEPTRA DS) 800-160 MG tablet Take 1 tablet by mouth 3 (three) times a week. 03/17/18   Elgergawy, Silver Huguenin, MD  thiamine 100 MG tablet Take 1 tablet (100 mg total) by mouth daily. 03/16/18   Elgergawy, Silver Huguenin, MD  vancomycin 750 mg in sodium chloride 0.9 % 150 mL Inject 750 mg into the vein Every Tuesday,Thursday,and Saturday with dialysis. 03/15/18   Elgergawy, Silver Huguenin, MD    Family History Family History  Problem Relation Age of Onset  . Autoimmune disease Neg Hx     Social History Social History   Tobacco Use  . Smoking status: Current Every Day Smoker    Packs/day: 1.00  . Smokeless tobacco: Never Used  Substance Use Topics  . Alcohol use: Yes  . Drug use: Not Currently    Types: Cocaine     Allergies   Patient has no known allergies.   Review of Systems Review of Systems  Constitutional: Positive for chills and fever (subjective).  HENT: Negative for congestion and rhinorrhea.   Eyes: Negative for redness and visual disturbance.  Respiratory: Positive for shortness of breath. Negative for wheezing.   Cardiovascular: Negative for chest pain and palpitations.  Gastrointestinal: Positive for abdominal pain, nausea and vomiting.  Genitourinary: Negative for dysuria and urgency.  Musculoskeletal: Positive for myalgias. Negative for arthralgias.  Skin: Negative for pallor and wound.  Neurological: Negative for dizziness and headaches.     Physical Exam Updated Vital Signs BP (!) 155/91   Pulse 78   Temp 98.2 F (36.8 C) (Oral)   Resp 19   SpO2 99%   Physical Exam  Constitutional: She is oriented to person, place, and time. She appears well-developed and well-nourished. She appears distressed.  Hot to touch  HENT:  Head: Normocephalic and atraumatic.  Eyes: Pupils are equal, round, and reactive to light. EOM are normal.  Neck: Normal range of motion. Neck supple.    Cardiovascular: Normal rate and regular rhythm. Exam reveals no gallop and no friction rub.  No murmur heard. Pulmonary/Chest: Effort normal. She has no wheezes. She has no rales.  Tachypnea  Abdominal: Soft. She exhibits no distension and no mass. There is tenderness ( Worse to the epigastrium). There is no guarding.  Musculoskeletal: She exhibits edema (2+ pitting edema to the RLE below the knee). She exhibits no tenderness.  Left AV fistula palpable thrill, no drainage or erythema overlying.  Neurological: She is alert and oriented to person, place, and time.  Skin: Skin is warm and dry. She is not diaphoretic.  Psychiatric: She has a normal mood and affect. Her behavior is normal.  Nursing note and vitals reviewed.    ED Treatments / Results  Labs (all labs ordered are listed, but only abnormal results are displayed) Labs Reviewed  COMPREHENSIVE METABOLIC PANEL - Abnormal; Notable  for the following components:      Result Value   Glucose, Bld 212 (*)    BUN 38 (*)    Creatinine, Ser 4.92 (*)    Calcium 7.7 (*)    Total Protein 5.1 (*)    Albumin 2.9 (*)    GFR calc non Af Amer 8 (*)    GFR calc Af Amer 10 (*)    All other components within normal limits  CBC WITH DIFFERENTIAL/PLATELET - Abnormal; Notable for the following components:   RBC 2.93 (*)    Hemoglobin 7.9 (*)    HCT 24.2 (*)    RDW 19.2 (*)    Platelets 129 (*)    Lymphs Abs 0.3 (*)    All other components within normal limits  URINALYSIS, ROUTINE W REFLEX MICROSCOPIC - Abnormal; Notable for the following components:   APPearance HAZY (*)    Glucose, UA 50 (*)    Hgb urine dipstick LARGE (*)    Protein, ur >=300 (*)    Leukocytes, UA TRACE (*)    RBC / HPF >50 (*)    Bacteria, UA RARE (*)    All other components within normal limits  I-STAT TROPONIN, ED - Abnormal; Notable for the following components:   Troponin i, poc 0.16 (*)    All other components within normal limits  I-STAT CHEM 8, ED -  Abnormal; Notable for the following components:   BUN 37 (*)    Creatinine, Ser 5.10 (*)    Glucose, Bld 212 (*)    Calcium, Ion 0.99 (*)    Hemoglobin 8.2 (*)    HCT 24.0 (*)    All other components within normal limits  URINE CULTURE  LIPASE, BLOOD  I-STAT CG4 LACTIC ACID, ED  I-STAT CG4 LACTIC ACID, ED    EKG EKG Interpretation  Date/Time:  Wednesday March 19 2018 15:43:42 EDT Ventricular Rate:  84 PR Interval:  116 QRS Duration: 66 QT Interval:  362 QTC Calculation: 427 R Axis:   -31 Text Interpretation:  Normal sinus rhythm Left axis deviation Anterior infarct , age undetermined Abnormal ECG No significant change since last tracing Confirmed by Deno Etienne [phone]) on 03/19/2018 4:45:46 PM   Radiology Ct Abdomen Pelvis Wo Contrast  Result Date: 03/19/2018 CLINICAL DATA:  62 year old female with reported kidney failure, recently discharged for pneumonia presents with abdominal pain. EXAM: CT ABDOMEN AND PELVIS WITHOUT CONTRAST TECHNIQUE: Multidetector CT imaging of the abdomen and pelvis was performed following the standard protocol without IV contrast. COMPARISON:  02/12/2018 FINDINGS: Lower chest: Cardiomegaly with small bilateral right greater than left pleural effusions. Redemonstration of bilateral bibasilar ground-glass opacities with inter and intra lobular septal thickening in a crazy paving pattern that can be seen with cryptogenic organizing pneumonia. Superimposed stigmata of CHF given presence of bilateral pleural effusions is not excluded. Hepatobiliary: The unenhanced liver is normal given limitations of a noncontrast study. The patient is status post cholecystectomy. Pancreas: Normal Spleen: Normal Adrenals/Urinary Tract: Mild bilateral perinephric fat stranding without obstructive uropathy. The urinary bladder is unremarkable for degree of distention. Stomach/Bowel: The stomach is decompressed. Normal small bowel rotation without obstruction or inflammation. The distal  and terminal ileum and appendix are normal. Unremarkable appearance of the colon with average amount of fecal retention noted. Vascular/Lymphatic: Moderate aorto iliac atherosclerosis without aneurysm. No adenopathy. Reproductive: Hysterectomy. Small fat containing dermoid in the left ovary measuring 15 mm, stable in appearance. Other: No free air nor free fluid. Small fat containing umbilical hernia.  Musculoskeletal: No acute or significant osseous findings. Minimal anterolisthesis grade 1 of L4 on L5 due to facet arthropathy. IMPRESSION: 1. Bibasilar ground-glass opacities with interlobular and intralobular septal thickening sparing the subpleural lung. Findings can be seen in cryptogenic organizing pneumonia. Superimposed pulmonary edema would be difficult to entirely exclude given presence of new bilateral small pleural effusions, right greater than left. 2. 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 3. Minimal anterolisthesis grade 1 of L4 on L5 secondary to degenerative facet arthropathy. Electronically Signed   By: Ashley Royalty M.D.   On: 03/19/2018 20:29   Dg Chest 2 View  Result Date: 03/19/2018 CLINICAL DATA:  Abdominal pain.  Recent dialysis shunt placement. EXAM: CHEST - 2 VIEW COMPARISON:  03/14/2018 and 03/06/2018 FINDINGS: Cardiomegaly with mild aortic atherosclerosis. Diffuse interstitial edema persists with small bilateral pleural effusions. Right IJ dialysis catheter terminates at the cavoatrial junction and is unchanged in appearance. There is pneumothorax. No acute osseous abnormality. IMPRESSION: Stable cardiomegaly with mild aortic atherosclerosis. Diffuse interstitial edema is again noted without significant change. Small bilateral pleural effusions. Electronically Signed   By: Ashley Royalty M.D.   On: 03/19/2018 17:03    Procedures Procedures (including critical care time) Procedure note: Ultrasound Guided Peripheral IV Ultrasound guided peripheral 1.88 inch angiocath IV placement  performed by me. Indications: Nursing unable to place IV. Details: The antecubital fossa and upper arm were evaluated with a multifrequency linear probe. Patent brachial veins were noted. 1 attempt was made to cannulate a vein under realtime US guidance with successful cannulation of the vein and catheter placement. There is return of non-pulsatile dark red blood. The patient tolerated the procedure well without complications. Images archived electronically.  CPT codes: [phone] and [phone]  Medications Ordered in ED Medications  HYDROmorphone (DILAUDID) injection 0.5 mg (0.5 mg Intravenous Given 03/19/18 1750)  ondansetron (ZOFRAN) injection 4 mg (4 mg Intravenous Given 03/19/18 1748)  HYDROmorphone (DILAUDID) injection 0.5 mg (0.5 mg Intravenous Given 03/19/18 1917)  ondansetron (ZOFRAN) injection 4 mg (4 mg Intravenous Given 03/19/18 1917)     Initial Impression / Assessment and Plan / ED Course  I have reviewed the triage vital signs and the nursing notes.  Pertinent labs & imaging results that were available during my care of the patient were reviewed by me and considered in my medical decision making (see chart for details).     62 yo F with a chief complaint of feeling bad.  Going on for the past couple days.  She has had epigastric abdominal pain vomiting and feeling like she may have a fever.  She was recently in the hospital for bilateral pneumonia and was started on dialysis.  Goes Tuesday Thursday and Saturday.  My exam the patient is diffusely warm.  No erythema or drainage to the recent fistula.  Will obtain chest x-ray labs give pain and nausea medicine and reassess.  Patient is feeling better from the abdominal pain post pain medicine administration.  She is still having some shortness of breath.  Her troponin was elevated at 0.16.  With her unilateral leg swelling and concern that this may be a PE however she was recently started on dialysis and I am worried that this may permanently  damage her kidneys.  I attempted to order a DVT study however the vascular ultrasonographers had left for the evening.  CT of the abdomen pelvis with fluid overload likely in bilateral lower lobes no acute findings in the abdomen and pelvis.  I discussed the  case with Dr. Joelyn Oms, nephrology he felt that with her recently starting dialysis she had a chance to potentially recover her renal function and would hold off on a contrasted study.  Recommended a VQ scan if the DVT study was negative and there is still clinical concern for a PE.  Will discuss with the hospitalist.  CRITICAL CARE Performed by: Cecilio Asper   Total critical care time: 80 minutes  Critical care time was exclusive of separately billable procedures and treating other patients.  Critical care was necessary to treat or prevent imminent or life-threatening deterioration.  Critical care was time spent personally by me on the following activities: development of treatment plan with patient and/or surrogate as well as nursing, discussions with consultants, evaluation of patient's response to treatment, examination of patient, obtaining history from patient or surrogate, ordering and performing treatments and interventions, ordering and review of laboratory studies, ordering and review of radiographic studies, pulse oximetry and re-evaluation of patient's condition.  The patients results and plan were reviewed and discussed.   Any x-rays performed were independently reviewed by myself.   Differential diagnosis were considered with the presenting HPI.  Medications  HYDROmorphone (DILAUDID) injection 0.5 mg (0.5 mg Intravenous Given 03/19/18 1750)  ondansetron (ZOFRAN) injection 4 mg (4 mg Intravenous Given 03/19/18 1748)  HYDROmorphone (DILAUDID) injection 0.5 mg (0.5 mg Intravenous Given 03/19/18 1917)  ondansetron (ZOFRAN) injection 4 mg (4 mg Intravenous Given 03/19/18 1917)    Vitals:   03/19/18 1800 03/19/18 1900  03/19/18 1930 03/19/18 2000  BP: (!) 168/96 (!) 162/88 (!) 152/87 (!) 155/91  Pulse: 73 66 72 78  Resp: (!) 23 19 14 19   Temp:      TempSrc:      SpO2: 97% 96% 95% 99%    Final diagnoses:  Acute pulmonary edema (HCC)    Admission/ observation were discussed with the admitting physician, patient and/or family and they are comfortable with the plan.   Final Clinical Impressions(s) / ED Diagnoses   Final diagnoses:  Acute pulmonary edema [facility])    ED Discharge Orders    None       Deno Etienne, DO 03/19/18 2158

## 2018-03-20 ENCOUNTER — Observation Stay (HOSPITAL_COMMUNITY): Payer: Medicaid Other

## 2018-03-20 ENCOUNTER — Observation Stay (HOSPITAL_BASED_OUTPATIENT_CLINIC_OR_DEPARTMENT_OTHER): Payer: Medicaid Other

## 2018-03-20 ENCOUNTER — Encounter (HOSPITAL_COMMUNITY): Payer: Self-pay | Admitting: *Deleted

## 2018-03-20 ENCOUNTER — Other Ambulatory Visit: Payer: Self-pay

## 2018-03-20 DIAGNOSIS — F1010 Alcohol abuse, uncomplicated: Secondary | ICD-10-CM | POA: Diagnosis not present

## 2018-03-20 DIAGNOSIS — R748 Abnormal levels of other serum enzymes: Secondary | ICD-10-CM

## 2018-03-20 DIAGNOSIS — J810 Acute pulmonary edema: Secondary | ICD-10-CM

## 2018-03-20 DIAGNOSIS — M7989 Other specified soft tissue disorders: Secondary | ICD-10-CM | POA: Diagnosis not present

## 2018-03-20 LAB — BASIC METABOLIC PANEL
ANION GAP: 15 (ref 5–15)
BUN: 51 mg/dL — ABNORMAL HIGH (ref 8–23)
CHLORIDE: 102 mmol/L (ref 98–111)
CO2: 25 mmol/L (ref 22–32)
Calcium: 7.7 mg/dL — ABNORMAL LOW (ref 8.9–10.3)
Creatinine, Ser: 6.41 mg/dL — ABNORMAL HIGH (ref 0.44–1.00)
GFR calc Af Amer: 7 mL/min — ABNORMAL LOW (ref >60)
GFR calc non Af Amer: 6 mL/min — ABNORMAL LOW (ref >60)
Glucose, Bld: 106 mg/dL — ABNORMAL HIGH (ref 70–99)
POTASSIUM: 3.8 mmol/L (ref 3.5–5.1)
Sodium: 142 mmol/L (ref 135–145)

## 2018-03-20 LAB — URINE CULTURE: CULTURE: NO GROWTH

## 2018-03-20 LAB — CBC
HEMATOCRIT: 23.1 % — AB (ref 36.0–46.0)
HEMOGLOBIN: 7.3 g/dL — AB (ref 12.0–15.0)
MCH: 26.5 pg (ref 26.0–34.0)
MCHC: 31.6 g/dL (ref 30.0–36.0)
MCV: 84 fL (ref 78.0–100.0)
Platelets: 135 10*3/uL — ABNORMAL LOW (ref 150–400)
RBC: 2.75 MIL/uL — ABNORMAL LOW (ref 3.87–5.11)
RDW: 19.5 % — ABNORMAL HIGH (ref 11.5–15.5)
WBC: 7.4 10*3/uL (ref 4.0–10.5)

## 2018-03-20 LAB — TROPONIN I
TROPONIN I: 0.11 ng/mL — AB (ref <0.03)
Troponin I: 0.13 ng/mL (ref <0.03)
Troponin I: 0.09 ng/mL (ref <0.03)

## 2018-03-20 LAB — GLUCOSE, CAPILLARY
GLUCOSE-CAPILLARY: 130 mg/dL — AB (ref 70–99)
GLUCOSE-CAPILLARY: 180 mg/dL — AB (ref 70–99)
Glucose-Capillary: 117 mg/dL — ABNORMAL HIGH (ref 70–99)
Glucose-Capillary: 96 mg/dL (ref 70–99)
Glucose-Capillary: 232 mg/dL — ABNORMAL HIGH (ref 70–99)

## 2018-03-20 LAB — HEPARIN LEVEL (UNFRACTIONATED): HEPARIN UNFRACTIONATED: 0.82 [IU]/mL — AB (ref 0.30–0.70)

## 2018-03-20 LAB — HEPATITIS B SURFACE ANTIGEN: Hepatitis B Surface Ag: NEGATIVE

## 2018-03-20 LAB — VANCOMYCIN, RANDOM: Vancomycin Rm: 14

## 2018-03-20 MED ORDER — TECHNETIUM TC 99M DIETHYLENETRIAME-PENTAACETIC ACID
31.6000 | Freq: Once | INTRAVENOUS | Status: AC | PRN
  Administered 2018-03-20: 31.6 via RESPIRATORY_TRACT

## 2018-03-20 MED ORDER — DARBEPOETIN ALFA 40 MCG/0.4ML IJ SOSY
PREFILLED_SYRINGE | INTRAMUSCULAR | Status: AC
  Filled 2018-03-20: qty 0.4

## 2018-03-20 MED ORDER — VANCOMYCIN HCL IN DEXTROSE 1-5 GM/200ML-% IV SOLN
1000.0000 mg | INTRAVENOUS | Status: AC
  Administered 2018-03-20: 1000 mg via INTRAVENOUS
  Filled 2018-03-20: qty 200

## 2018-03-20 MED ORDER — HEPARIN SODIUM (PORCINE) 5000 UNIT/ML IJ SOLN
5000.0000 [IU] | Freq: Three times a day (TID) | INTRAMUSCULAR | Status: DC
  Administered 2018-03-20 – 2018-03-24 (×12): 5000 [IU] via SUBCUTANEOUS
  Filled 2018-03-20 (×12): qty 1

## 2018-03-20 MED ORDER — PENTAFLUOROPROP-TETRAFLUOROETH EX AERO: 1.0000 "application " | INHALATION_SPRAY | CUTANEOUS | Status: DC | PRN

## 2018-03-20 MED ORDER — CHLORHEXIDINE GLUCONATE CLOTH 2 % EX PADS
6.0000 | MEDICATED_PAD | Freq: Every day | CUTANEOUS | Status: DC
  Administered 2018-03-20 – 2018-03-24 (×3): 6 via TOPICAL

## 2018-03-20 MED ORDER — CHLORHEXIDINE GLUCONATE CLOTH 2 % EX PADS
6.0000 | MEDICATED_PAD | Freq: Every day | CUTANEOUS | Status: DC
  Administered 2018-03-20 – 2018-03-24 (×3): 6 via TOPICAL

## 2018-03-20 MED ORDER — TECHNETIUM TO 99M ALBUMIN AGGREGATED
4.2000 | Freq: Once | INTRAVENOUS | Status: AC | PRN
  Administered 2018-03-20: 4.2 via INTRAVENOUS

## 2018-03-20 MED ORDER — POLYETHYLENE GLYCOL 3350 17 G PO PACK
17.0000 g | PACK | Freq: Two times a day (BID) | ORAL | Status: DC
  Administered 2018-03-20 – 2018-03-24 (×7): 17 g via ORAL
  Filled 2018-03-20 (×7): qty 1

## 2018-03-20 MED ORDER — VANCOMYCIN HCL IN DEXTROSE 1-5 GM/200ML-% IV SOLN
INTRAVENOUS | Status: AC
  Filled 2018-03-20: qty 200

## 2018-03-20 MED ORDER — SODIUM CHLORIDE 0.9 % IV SOLN: 100.0000 mL | INTRAVENOUS | Status: DC | PRN

## 2018-03-20 MED ORDER — VANCOMYCIN HCL 1000 MG IV SOLR: 750.0000 mg | INTRAVENOUS | Status: DC

## 2018-03-20 MED ORDER — LIDOCAINE-PRILOCAINE 2.5-2.5 % EX CREA: 1.0000 "application " | TOPICAL_CREAM | CUTANEOUS | Status: DC | PRN

## 2018-03-20 MED ORDER — FAMOTIDINE 20 MG PO TABS
20.0000 mg | ORAL_TABLET | Freq: Two times a day (BID) | ORAL | Status: DC
  Administered 2018-03-20: 20 mg via ORAL
  Filled 2018-03-20: qty 1

## 2018-03-20 NOTE — Progress Notes (Addendum)
Pharmacy Antibiotic Note  Rhonda Lynch is a 63 y.o. female admitted on 03/19/2018 with SOB. Pharmacy has been consulted for Vancomycin + Ceftazidime dosing due to concern for perm cath infection.   The patient was recently admitted 6/5-7/6 and continued on Vancomycin and Ceftazidime at discharge on 7/6 due to fevers. Will obtain a random Vancomycin level pre-HD on 7/11 to ensure dose remains appropriate.   Plan: - Will obtain a Vancomycin random level with AM labs - Vancomycin 750 mg/HD-TTS and Ceftazidime 2g on TTS @ 1800 already re-ordered by MD - Will continue to follow HD schedule/duration, culture results, LOT, and antibiotic de-escalation plans   -------------------------------------------------------------------------- Addendum:  AM Vancomycin random resulted as 14 mcg/ml which is slight SUBtherapeutic of goal range of 15-25 mcg/ml.   Plan - Vancomycin 1g post HD on Thurs 7/11 - Resume Vancomycin 750 mg/HD on TTS starting on 7/13 - Continue Ceftazidime 2g on TTS at 1800 - Will continue to follow HD schedule/duration, culture results, LOT, and antibiotic de-escalation plans   Alycia Rossetti, PharmD, BCPS Pager: [phone] 6:50 AM    Height: 5\' 1"  (154.9 cm) Weight: 160 lb 7.9 oz (72.8 kg) IBW/kg (Calculated) : 47.8  Temp (24hrs), Avg:98.1 F (36.7 C), Min:98 F (36.7 C), Max:98.2 F (36.8 C)  Recent Labs  Lab 03/13/18 0757 03/14/18 0811 03/15/18 0749 03/15/18 0800 03/19/18 1820 03/19/18 1833 03/19/18 1834 03/19/18 2145  WBC 6.2 4.2 4.3  --  6.1  --   --   --   CREATININE 5.01*  --   --  5.16* 4.92*  --  5.10*  --   LATICACIDVEN  --   --   --   --   --  1.15  --  0.87    Estimated Creatinine Clearance: 10.2 mL/min (A) (by C-G formula based on SCr of 5.1 mg/dL (H)).    No Known Allergies  Antimicrobials this admission: Vanc 7/5 >> Ceftazidime 7/5 >>  Dose adjustments this admission: n/a  Microbiology results: 7/10 UCx >>  Thank you for allowing  pharmacy to be a part of this patient's care.  Alycia Rossetti, PharmD, BCPS Clinical Pharmacist Pager: [phone] If after 3:30p, please call main pharmacy at: [phone] 03/20/2018 4:04 AM

## 2018-03-20 NOTE — Progress Notes (Signed)
ANTICOAGULATION CONSULT NOTE - Follow-Up Consult  Pharmacy Consult for Heparin Indication: pulmonary embolus  No Known Allergies  Patient Measurements: Weight 72.8 kg IBW 48 kg Heparin Dosing Weight: 63.8 kg  Vital Signs: Temp: 98 F (36.7 C) (07/11 0341) Temp Source: Oral (07/11 0341) BP: 142/97 (07/11 0536) Pulse Rate: 71 (07/11 0536)  Labs: Recent Labs    03/19/18 1820 03/19/18 1834 03/19/18 2140 03/20/18 0534  HGB 7.9* 8.2*  --  7.3*  HCT 24.2* 24.0*  --  23.1*  PLT 129*  --   --  135*  HEPARINUNFRC  --   --   --  0.82*  CREATININE 4.92* 5.10*  --   --   TROPONINI  --   --  0.13*  --     Estimated Creatinine Clearance: 10.2 mL/min (A) (by C-G formula based on SCr of 5.1 mg/dL (H)).   Medical History: History reviewed. No pertinent past medical history.  Medications:  No anticoagulation pta  Assessment: 62 year old female who presented elevated troponin and swollen LE  to start IV Heparin for pulmonary embolism and also with new lower extremity swelling and concern for DVT.   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 Miachel Roux note. Will monitor closely while on Heparin.   Heparin level this morning is SUPRAtherapeutic (HL 0.82, goal of 0.3-0.7). Hgb/Hct slight drop, plts 135  Goal of Therapy:  Heparin level 0.3-0.7 units/ml Monitor platelets by anticoagulation protocol: Yes   Plan:  - Decrease Heparin to 1000 units/hr  - Will continue to monitor for any signs/symptoms of bleeding and will follow up with heparin level in 8 hours   Thank you for allowing pharmacy to be a part of this patient's care.  Alycia Rossetti, PharmD, BCPS Clinical Pharmacist Pager: [phone] If after 3:30p, please call main pharmacy at: [phone] 03/20/2018 6:43 AM

## 2018-03-20 NOTE — Progress Notes (Signed)
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 female with medical history significant of Cocaine and EtOH abuse.  Recent admit to our service for a month long hospital stay this past month, discharged 4 days ago.62 year old female with past medical history of cocaine/alcohol abuse who was admitted on 02/12/2018 with abdominal pain, nausea, vomiting. She was found to have Streptococcus pneumonia and also had acute kidney injury on presentation. Nephrology was consulted . 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 She has progressed to end-stage renal disease and is currently undergoing dialysis, plasmapheresis and has been started, on steroids and cyclophosphamide. Discharge planning after setting of outpatient dialysis facility. Received AV graft on 6/26/2019with hematoma which was subsequently evacuated Readmitted 7/10 with shortness of breath  Assessment and plan  Acute renal failure>>End stage renal disease, on hemodialysis -Secondary to rapidly progressive glomerular nephritis, confirmed by biopsy, nephrology following, status post plasmapheresis for 7 days, currently on dialysis, recently clipped for Tuesday Thursday Saturday dialysis -Status post left upper extremity aVF by Dr. Donnetta Hutching 6/26, status post hematoma evacuation 03/06/2018 by Dr. Doren Custard,    Abdominal pain nausea vomiting Could be secondary to gastritis due to steroids /alcohol CT abdomen pelvis  negative for intra-abdominal pathology Patient has been on prednisone Continue Pepcid increased to twice a day CT shows normal GI tract, lipase normal Started miralax for constipation RUQ USG to r/o cholecystitis   Shortness of breath  significant fever 101.6 03/14/2018 , no evidence of infectious etiology,. Patient had a CT abdomen pelvis , bibasilar ground glass opacities, possible cryptogenic organizing pneumonia with superimposed pulmonary edema, other work up for recent fever included C. difficile is negative, cultures peripheral and from permacath remains   Negative .Continue IV vancomycin and ceftazidime empirically until permacath infection is ruled out,   she is to continue on these antibiotics on hemodialysis days Tuesday Thursday and Saturday .Slightly troponin elevation  But flat, will dc heparin gtt, doubt ACS  . 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   -Status post plasmapheresis, treated with IV steroid and then Cytoxan has been lowered to 50 mg oral daily on discharge, and currently on prednisone 40 mg oral daily, this is to be tapered further by renal as an outpatient, continue with Bactrim for prophylaxis while on immunosuppressive medicine . Being followed by nephrology  Diabetes mellitus hemoglobin A1c is 6.8, currently on Lantus 5 units and NovoLog sliding scale insulin   Anemia/thrombocytopenia -Anemia of chronic kidney disease, and acute blood loss anemia,  transfused 2 units during her most recent admission -Procrit per renal follow-up CBC    Recent Diffuse alveolar hemorrhage versus bronchiolitis obliterans or cryptogenic organizing pneumonia as per the imaging. No active issues at this time. Had pulmonary  consulted during previous hospitalization. Currently stable. 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 Patient will need  to follow-up with infectious disease as outpatient.   History of cocaine abuse: UDS was positive for cocaine  previous admission. Currently stable. No agitation or anxiety noted  History of EtOH abuse , on CIWA protocol      DVT prophylaxsis heparin  Code Status:  Full code    Family Communication: Discussed in detail with the patient, all imaging results, lab results explained to the patient   Disposition Plan:    Anticipate dc in am if nausea , abdominal pain resolved      Consultants:  nephrology  Procedures:  none  Antibiotics: Anti-infectives (From admission, onward)   Start     Dose/Rate Route Frequency Ordered Stop   03/22/18 1200  vancomycin (VANCOCIN) 750 mg in sodium chloride 0.9 % 150 mL IVPB     750 mg 150 mL/hr over 60 Minutes Intravenous Every T-Th-Sa (Hemodialysis) 03/20/18 0650     03/21/18 0900  sulfamethoxazole-trimethoprim (BACTRIM DS,SEPTRA DS) 800-160 MG per tablet 1 tablet     1 tablet Oral Once per day on Mon Wed Fri 03/19/18 2157     03/20/18 1800  cefTAZidime (FORTAZ) 2 g in sodium chloride 0.9 % 100 mL IVPB     2 g 200 mL/hr over 30 Minutes Intravenous Every T-Th-Sa (1800) 03/19/18 2319     03/20/18 1200  vancomycin (VANCOCIN) 750 mg in sodium chloride 0.9 % 150 mL IVPB  Status:  Discontinued     750 mg 150 mL/hr over 60 Minutes Intravenous Every T-Th-Sa (Hemodialysis) 03/19/18 2157 03/20/18 0650   03/20/18 1200  Ceftazidime (FORTAZ) injection 2 g  Status:  Discontinued     2 g Intravenous Every T-Th-Sa (Hemodialysis) 03/19/18 2157 03/19/18 2319   03/20/18 1200  vancomycin (VANCOCIN) IVPB 1000 mg/200 mL premix     1,000 mg 200 mL/hr over 60 Minutes Intravenous Every Thu (Hemodialysis) 03/20/18 0650 03/27/18 1159         HPI/Subjective: complaing of nausea /abdominal pain, clutching her abdomen with her right hand and pointing towards RUQ   Objective: Vitals:   03/20/18 0507 03/20/18  0536 03/20/18 0742 03/20/18 1100  BP: (!) 171/97 (!) 142/97 (!) 156/75 (!) 146/66  Pulse: 64 71 70 85  Resp:      Temp:   98.9 F (37.2 C) 99 F (37.2 C)  TempSrc:   Oral Oral  SpO2: 98% 97% 94% 93%  Weight:      Height:        Intake/Output Summary (Last 24 hours) at 03/20/2018 1256 Last data filed at 03/20/2018 0700 Gross per 24 hour  Intake 261.9 ml  Output -  Net 261.9 ml    Exam:  Examination:  General exam: Appears calm and comfortable  Respiratory system: Clear to auscultation. Respiratory effort normal. Cardiovascular system: S1 & S2 heard, RRR. No JVD, murmurs, rubs, gallops or clicks. No pedal edema. Gastrointestinal system: Abdomen is nondistended, soft and diffusely tender. No organomegaly or masses felt. Normal bowel sounds heard. Central nervous system: Alert and oriented. No focal neurological deficits. Extremities: Symmetric 5 x 5 power. Skin: No rashes, lesions or ulcers Psychiatry: Judgement and insight appear normal. Mood & affect appropriate.     Data Reviewed: I have personally reviewed following labs and imaging studies  Micro Results Recent Results (from the past 240 hour(s))  Culture, blood (Routine X 2) w Reflex to ID Panel     Status: None   Collection Time: 03/14/18  8:11 AM  Result Value Ref Range Status   Specimen  Description BLOOD RIGHT ARM  Final   Special Requests   Final    BOTTLES DRAWN AEROBIC AND ANAEROBIC Blood Culture adequate volume   Culture   Final    NO GROWTH 5 DAYS Performed at [facility], [address], [place], [place] [address]    Report Status 03/19/2018 FINAL  Final  Culture, blood (Routine X 2) w Reflex to ID Panel     Status: None   Collection Time: 03/14/18  8:25 AM  Result Value Ref Range Status   Specimen Description BLOOD RIGHT HAND  Final   Special Requests   Final    BOTTLES DRAWN AEROBIC AND ANAEROBIC Blood Culture adequate volume   Culture   Final    NO GROWTH 5 DAYS Performed at [facility], [place] [address], [place], [place] [address]    Report Status 03/19/2018 FINAL  Final  C difficile quick scan w PCR reflex     Status: None   Collection Time: 03/14/18  9:24 AM  Result Value Ref Range Status   C Diff antigen NEGATIVE NEGATIVE Final   C Diff toxin NEGATIVE NEGATIVE Final   C Diff interpretation No C. difficile detected.  Final    Comment: Performed at [facility], [place] [address], [place], [place] [address]  Culture, blood (single)     Status: None   Collection Time: 03/14/18  4:39 PM  Result Value Ref Range Status   Specimen Description BLOOD CENTRAL LINE  Final   Special Requests   Final    BOTTLES DRAWN AEROBIC AND ANAEROBIC Blood Culture results may not be optimal due to an inadequate volume of blood received in culture bottles   Culture   Final    NO GROWTH 5 DAYS Performed at [facility], [place] [address], [place], [place] [address]    Report Status 03/19/2018 FINAL  Final  Culture, blood (single)     Status: None   Collection Time: 03/14/18  4:39 PM  Result Value Ref Range Status   Specimen Description BLOOD CENTRAL LINE  Final   Special Requests   Final    BOTTLES DRAWN AEROBIC AND ANAEROBIC Blood Culture adequate volume   Culture   Final    NO GROWTH 5 DAYS Performed at [facility] [address], [place],  [address]    Report Status 03/19/2018 FINAL  Final    Radiology Reports Ct Abdomen Pelvis Wo Contrast  Result Date: 03/19/2018 CLINICAL DATA:  62 year old female with reported kidney failure, recently discharged for pneumonia presents with abdominal pain. EXAM: CT ABDOMEN AND PELVIS WITHOUT CONTRAST TECHNIQUE: Multidetector CT imaging of the abdomen and pelvis was performed following the standard protocol without IV contrast. COMPARISON:  02/12/2018 FINDINGS: Lower chest: Cardiomegaly with small bilateral right greater than left pleural effusions. Redemonstration of bilateral bibasilar ground-glass opacities with inter  and intra lobular septal thickening in a crazy paving pattern that can be seen with cryptogenic organizing pneumonia. Superimposed stigmata of CHF given presence of bilateral pleural effusions is not excluded. Hepatobiliary: The unenhanced liver is normal given limitations of a noncontrast study. The patient is status post cholecystectomy. Pancreas: Normal Spleen: Normal Adrenals/Urinary Tract: Mild bilateral perinephric fat stranding without obstructive uropathy. The urinary bladder is unremarkable for degree of distention. Stomach/Bowel: The stomach is decompressed. Normal small bowel rotation without obstruction or inflammation. The distal and terminal ileum and appendix are normal. Unremarkable appearance of the colon with average amount of fecal  retention noted. Vascular/Lymphatic: Moderate aorto iliac atherosclerosis without aneurysm. No adenopathy. Reproductive: Hysterectomy. Small fat containing dermoid in the left ovary measuring 15 mm, stable in appearance. Other: No free air nor free fluid. Small fat containing umbilical hernia. Musculoskeletal: No acute or significant osseous findings. Minimal anterolisthesis grade 1 of L4 on L5 due to facet arthropathy. IMPRESSION: 1. Bibasilar ground-glass opacities with interlobular and intralobular septal thickening sparing the subpleural lung. Findings can be seen in cryptogenic organizing pneumonia. Superimposed pulmonary edema would be difficult to entirely exclude given presence of new bilateral small pleural effusions, right greater than left. 2. 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 3. Minimal anterolisthesis grade 1 of L4 on L5 secondary to degenerative facet arthropathy. Electronically Signed   By: Ashley Royalty M.D.   On: 03/19/2018 20:29   Dg Chest 2 View  Result Date: 03/19/2018 CLINICAL DATA:  Abdominal pain.  Recent dialysis shunt placement. EXAM: CHEST - 2 VIEW COMPARISON:  03/14/2018 and 03/06/2018 FINDINGS: Cardiomegaly with mild aortic  atherosclerosis. Diffuse interstitial edema persists with small bilateral pleural effusions. Right IJ dialysis catheter terminates at the cavoatrial junction and is unchanged in appearance. There is pneumothorax. No acute osseous abnormality. IMPRESSION: Stable cardiomegaly with mild aortic atherosclerosis. Diffuse interstitial edema is again noted without significant change. Small bilateral pleural effusions. Electronically Signed   By: Ashley Royalty M.D.   On: 03/19/2018 17:03   Nm Pulmonary Vent And Perf (v/q Scan)  Result Date: 03/20/2018 CLINICAL DATA:  Shortness of Breath EXAM: NUCLEAR MEDICINE VENTILATION - PERFUSION LUNG SCAN VIEWS: Anterior, posterior, left lateral, right lateral, RPO, LPO, RAO, LAO-ventilation and perfusion RADIOPHARMACEUTICALS:  31.6 mCi of Tc-55m DTPA aerosol inhalation and 4.2 mCi Tc86m-MAA IV COMPARISON:  Chest radiograph March 19, 2018 FINDINGS: Ventilation: Radiotracer uptake is homogeneous and symmetric bilaterally. No ventilation defects are evident. Perfusion: Radiotracer uptake is homogeneous and symmetric bilaterally. No appreciable perfusion defects. IMPRESSION: No appreciable ventilation or perfusion defects. This study constitutes a very low probability of pulmonary embolus. Electronically Signed   By: Lowella Grip III M.D.   On: 03/20/2018 10:20   Ir Fluoro Guide Cv Line Right  Result Date: 02/26/2018 INDICATION: 53 year old with acute kidney injury and on dialysis. Patient currently has a non tunneled dialysis catheter and needs conversion to a tunneled dialysis catheter. EXAM: CONVERSION OF NON TUNNELED DIALYSIS CATHETER TO A TUNNELED DIALYSIS CATHETER WITH FLUOROSCOPY Physician: Stephan Minister. Anselm Pancoast, MD MEDICATIONS: Ancef 2 g; The antibiotic was administered within an appropriate time interval prior to skin puncture. ANESTHESIA/SEDATION: Versed 1.5 mg IV; Fentanyl 100 mcg IV; Moderate Sedation Time:  20 minutes The patient was continuously monitored during the procedure  by the interventional radiology nurse under my direct supervision. FLUOROSCOPY TIME:  Fluoroscopy Time: 18 seconds, 1 mGy COMPLICATIONS: None immediate. PROCEDURE: The existing right jugular dialysis catheter was evaluated. This catheter was felt to be adequate for conversion to a tunneled catheter. The procedure was explained to the patient. The risks and benefits of the procedure were discussed and the patient's questions were addressed. Informed consent was obtained from the patient. The existing catheter and the right upper chest were prepped and draped in a sterile fashion. Maximal barrier sterile technique was utilized including caps, mask, sterile gowns, sterile gloves, sterile drape, hand hygiene and skin antiseptic. The skin below the right clavicle and the skin around the catheter were anesthetized with 1% lidocaine. A small incision was made below the right clavicle. A subcutaneous tunnel was formed from the incision to the old  catheter site. The old catheter was removed over a Amplatz wire. The wire was advanced into the IVC. A 19 cm tip to cuff Palindrome catheter was pulled through the subcutaneous tunnel. Peel-away sheath was placed over the Amplatz wire. The catheter was placed through the peel-away sheath and positioned at the superior cavoatrial junction. Both lumens aspirated and flushed well. The catheter lumens were flushed with appropriate amount of heparin. The catheter was sutured to skin with Prolene suture. The old jugular right access site was closed using 2 interrupted Ethilon sutures. Fluoroscopic images were taken and saved for this procedure. FINDINGS: Catheter tip at the superior cavoatrial junction. IMPRESSION: Successful conversion of the right jugular catheter to a tunneled dialysis catheter. Right neck sutures can be removed in 10-14 days. Electronically Signed   By: Markus Daft M.D.   On: 02/26/2018 17:16   Ir Fluoro Guide Cv Line Right  Result Date: 02/18/2018 INDICATION:  62 year old female with renal failure in need of hemodialysis EXAM: IR RIGHT FLOURO GUIDE CV LINE; IR ULTRASOUND GUIDANCE VASC ACCESS RIGHT MEDICATIONS: None ANESTHESIA/SEDATION: None FLUOROSCOPY TIME:  Fluoroscopy Time: 0 minutes 6 seconds (0 mGy). COMPLICATIONS: None immediate. PROCEDURE: Informed written consent was obtained from the patient after a thorough discussion of the procedural risks, benefits and alternatives. All questions were addressed. Maximal Sterile Barrier Technique was utilized including caps, mask, sterile gowns, sterile gloves, sterile drape, hand hygiene and skin antiseptic. A timeout was performed prior to the initiation of the procedure. The right internal jugular vein was interrogated with ultrasound and found to be widely patent. An image was obtained and stored for the medical record. Local anesthesia was attained by infiltration with 1% lidocaine. A small dermatotomy was made. Under real-time sonographic guidance, the vessel was punctured with a 21 gauge micropuncture needle. Using standard technique, the initial micro needle was exchanged over a 0.018 micro wire for a transitional 4 [place] micro sheath. The micro sheath was then exchanged over a 0.035 wire for a dilator which was used to dilate the soft tissue tract. A 20 cm 3 lumen non tunneled dialysis catheter was then advanced over the wire and position with the tip in the upper right atrium. The catheter flushed and aspirated with ease. The catheter was flushed with heparinized saline and secured to the skin with 0 Prolene suture. The patient tolerated the procedure well. IMPRESSION: Successful placement of a non tunneled Trialysis catheter via the right internal jugular vein. The catheter tip is in the upper right atrium and ready for immediate use. Signed, Criselda Peaches, MD [facility] Radiology Electronically Signed   By: Jacqulynn Cadet M.D.   On: 02/18/2018 16:08   Ir  US Guide Vasc Access Right  Result Date: 02/18/2018 INDICATION: 62 year old female with renal failure in need of hemodialysis EXAM: IR RIGHT FLOURO GUIDE CV LINE; IR ULTRASOUND GUIDANCE VASC ACCESS RIGHT MEDICATIONS: None ANESTHESIA/SEDATION: None FLUOROSCOPY TIME:  Fluoroscopy Time: 0 minutes 6 seconds (0 mGy). COMPLICATIONS: None immediate. PROCEDURE: Informed written consent was obtained from the patient after a thorough discussion of the procedural risks, benefits and alternatives. All questions were addressed. Maximal Sterile Barrier Technique was utilized including caps, mask, sterile gowns, sterile gloves, sterile drape, hand hygiene and skin antiseptic. A timeout was performed prior to the initiation of the procedure. The right internal jugular vein was interrogated with ultrasound and found to be widely patent. An image was obtained and stored for the medical record. Local anesthesia was attained by infiltration  with 1% lidocaine. A small dermatotomy was made. Under real-time sonographic guidance, the vessel was punctured with a 21 gauge micropuncture needle. Using standard technique, the initial micro needle was exchanged over a 0.018 micro wire for a transitional 4 [place] micro sheath. The micro sheath was then exchanged over a 0.035 wire for a dilator which was used to dilate the soft tissue tract. A 20 cm 3 lumen non tunneled dialysis catheter was then advanced over the wire and position with the tip in the upper right atrium. The catheter flushed and aspirated with ease. The catheter was flushed with heparinized saline and secured to the skin with 0 Prolene suture. The patient tolerated the procedure well. IMPRESSION: Successful placement of a non tunneled Trialysis catheter via the right internal jugular vein. The catheter tip is in the upper right atrium and ready for immediate use. Signed, Criselda Peaches, MD [facility] Radiology Electronically  Signed   By: Jacqulynn Cadet M.D.   On: 02/18/2018 16:08   Dg Chest Port 1 View  Result Date: 03/14/2018 CLINICAL DATA:  Fever EXAM: PORTABLE CHEST 1 VIEW COMPARISON:  March 06, 2018 FINDINGS: Central catheter tip is in the superior vena cava near the cavoatrial junction. No pneumothorax. There is mild interstitial edema diffusely with minimal pleural effusions bilaterally. No consolidation. Heart is mildly enlarged with pulmonary venous hypertension. No adenopathy. There is aortic atherosclerosis. No bone lesions. IMPRESSION: Central catheter as described without pneumothorax. Cardiomegaly with mild pulmonary venous hypertension consistent with a degree of pulmonary vascular congestion. Mild interstitial edema with minimal pleural effusions. No consolidation. There is aortic atherosclerosis. Aortic Atherosclerosis (ICD10-I70.0). Electronically Signed   By: Lowella Grip III M.D.   On: 03/14/2018 07:30   Dg Chest Port 1 View  Result Date: 03/06/2018 CLINICAL DATA:  Fever. EXAM: PORTABLE CHEST 1 VIEW COMPARISON:  CT 02/17/2018.  Chest x-ray 02/14/2018. FINDINGS: Right IJ dual-lumen catheter with tip over cavoatrial junction. Cardiomegaly. No focal infiltrate. No pleural effusion or pneumothorax. No acute bony abnormality. J IMPRESSION: 1.  Right IJ dual-lumen catheter with tip over cavoatrial junction. 2. No acute cardiopulmonary disease. No evidence of pulmonary infiltrate. Electronically Signed   By: Marcello Moores  Register   On: 03/06/2018 13:47     CBC Recent Labs  Lab 03/14/18 0811 03/15/18 0749 03/19/18 1820 03/19/18 1834 03/20/18 0534  WBC 4.2 4.3 6.1  --  7.4  HGB 10.0* 8.4* 7.9* 8.2* 7.3*  HCT 30.4* 25.6* 24.2* 24.0* 23.1*  PLT 122* 117* 129*  --  135*  MCV 81.9 81.8 82.6  --  84.0  MCH 27.0 26.8 27.0  --  26.5  MCHC 32.9 32.8 32.6  --  31.6  RDW 18.5* 18.6* 19.2*  --  19.5*  LYMPHSABS  --   --  0.3*  --   --   MONOABS  --   --  0.4  --   --   EOSABS  --   --  0.0  --   --    BASOSABS  --   --  0.0  --   --     Chemistries  Recent Labs  Lab 03/15/18 0800 03/19/18 1820 03/19/18 1834 03/20/18 0534  NA 134* 142 140 142  K 4.0 3.8 3.7 3.8  CL 99 104 102 102  CO2 26 26  --  25  GLUCOSE 148* 212* 212* 106*  BUN 50* 38* 37* 51*  CREATININE 5.16* 4.92* 5.10* 6.41*  CALCIUM 7.9* 7.7*  --  7.7*  AST  --  20  --   --   ALT  --  20  --   --   ALKPHOS  --  54  --   --   BILITOT  --  1.0  --   --    ------------------------------------------------------------------------------------------------------------------ estimated creatinine clearance is 8.1 mL/min (A) (by C-G formula based on SCr of 6.41 mg/dL (H)). ------------------------------------------------------------------------------------------------------------------ No results for input(s): HGBA1C in the last 72 hours. ------------------------------------------------------------------------------------------------------------------ No results for input(s): CHOL, HDL, LDLCALC, TRIG, CHOLHDL, LDLDIRECT in the last 72 hours. ------------------------------------------------------------------------------------------------------------------ No results for input(s): TSH, T4TOTAL, T3FREE, THYROIDAB in the last 72 hours.  Invalid input(s): FREET3 ------------------------------------------------------------------------------------------------------------------ No results for input(s): VITAMINB12, FOLATE, FERRITIN, TIBC, IRON, RETICCTPCT in the last 72 hours.  Coagulation profile No results for input(s): INR, PROTIME in the last 168 hours.  No results for input(s): DDIMER in the last 72 hours.  Cardiac Enzymes Recent Labs  Lab 03/19/18 2140 03/20/18 0534 03/20/18 1011  TROPONINI 0.13* 0.11* 0.09*   ------------------------------------------------------------------------------------------------------------------ Invalid input(s): POCBNP   CBG: Recent Labs  Lab 03/19/18 2217 03/19/18 2340  03/20/18 0503 03/20/18 0743 03/20/18 1058  GLUCAP 157* 158* 117* 96 130*       Studies: Ct Abdomen Pelvis Wo Contrast  Result Date: 03/19/2018 CLINICAL DATA:  62 year old female with reported kidney failure, recently discharged for pneumonia presents with abdominal pain. EXAM: CT ABDOMEN AND PELVIS WITHOUT CONTRAST TECHNIQUE: Multidetector CT imaging of the abdomen and pelvis was performed following the standard protocol without IV contrast. COMPARISON:  02/12/2018 FINDINGS: Lower chest: Cardiomegaly with small bilateral right greater than left pleural effusions. Redemonstration of bilateral bibasilar ground-glass opacities with inter and intra lobular septal thickening in a crazy paving pattern that can be seen with cryptogenic organizing pneumonia. Superimposed stigmata of CHF given presence of bilateral pleural effusions is not excluded. Hepatobiliary: The unenhanced liver is normal given limitations of a noncontrast study. The patient is status post cholecystectomy. Pancreas: Normal Spleen: Normal Adrenals/Urinary Tract: Mild bilateral perinephric fat stranding without obstructive uropathy. The urinary bladder is unremarkable for degree of distention. Stomach/Bowel: The stomach is decompressed. Normal small bowel rotation without obstruction or inflammation. The distal and terminal ileum and appendix are normal. Unremarkable appearance of the colon with average amount of fecal retention noted. Vascular/Lymphatic: Moderate aorto iliac atherosclerosis without aneurysm. No adenopathy. Reproductive: Hysterectomy. Small fat containing dermoid in the left ovary measuring 15 mm, stable in appearance. Other: No free air nor free fluid. Small fat containing umbilical hernia. Musculoskeletal: No acute or significant osseous findings. Minimal anterolisthesis grade 1 of L4 on L5 due to facet arthropathy. IMPRESSION: 1. Bibasilar ground-glass opacities with interlobular and intralobular septal thickening sparing  the subpleural lung. Findings can be seen in cryptogenic organizing pneumonia. Superimposed pulmonary edema would be difficult to entirely exclude given presence of new bilateral small pleural effusions, right greater than left. 2. 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 3. Minimal anterolisthesis grade 1 of L4 on L5 secondary to degenerative facet arthropathy. Electronically Signed   By: Ashley Royalty M.D.   On: 03/19/2018 20:29   Dg Chest 2 View  Result Date: 03/19/2018 CLINICAL DATA:  Abdominal pain.  Recent dialysis shunt placement. EXAM: CHEST - 2 VIEW COMPARISON:  03/14/2018 and 03/06/2018 FINDINGS: Cardiomegaly with mild aortic atherosclerosis. Diffuse interstitial edema persists with small bilateral pleural effusions. Right IJ dialysis catheter terminates at the cavoatrial junction and is unchanged in appearance. There is pneumothorax. No acute osseous abnormality. IMPRESSION: Stable cardiomegaly with mild  aortic atherosclerosis. Diffuse interstitial edema is again noted without significant change. Small bilateral pleural effusions. Electronically Signed   By: Ashley Royalty M.D.   On: 03/19/2018 17:03   Nm Pulmonary Vent And Perf (v/q Scan)  Result Date: 03/20/2018 CLINICAL DATA:  Shortness of Breath EXAM: NUCLEAR MEDICINE VENTILATION - PERFUSION LUNG SCAN VIEWS: Anterior, posterior, left lateral, right lateral, RPO, LPO, RAO, LAO-ventilation and perfusion RADIOPHARMACEUTICALS:  31.6 mCi of Tc-65m DTPA aerosol inhalation and 4.2 mCi Tc37m-MAA IV COMPARISON:  Chest radiograph March 19, 2018 FINDINGS: Ventilation: Radiotracer uptake is homogeneous and symmetric bilaterally. No ventilation defects are evident. Perfusion: Radiotracer uptake is homogeneous and symmetric bilaterally. No appreciable perfusion defects. IMPRESSION: No appreciable ventilation or perfusion defects. This study constitutes a very low probability of pulmonary embolus. Electronically Signed   By: Lowella Grip III M.D.   On:  03/20/2018 10:20      Lab Results  Component Value Date   HGBA1C 6.8 (H) 03/03/2018   HGBA1C (H) 02/20/2010    5.9 (NOTE)                                                                       According to the ADA Clinical Practice Recommendations for 2011, when HbA1c is used as a screening test:   >=6.5%   Diagnostic of Diabetes Mellitus           (if abnormal result  is confirmed)  5.7-6.4%   Increased risk of developing Diabetes Mellitus  References:Diagnosis and Classification of Diabetes Mellitus,Diabetes HWEX,9371,69(CVELF 1):S62-S69 and Standards of Medical Care in         Diabetes - 2011,Diabetes Care,2011,34  (Suppl 1):S11-S61.   HGBA1C 5.9 02/19/2010   Lab Results  Component Value Date   [facility]  02/20/2010    75        Total Cholesterol/HDL:CHD Risk Coronary Heart Disease Risk Table                     Men   Women  1/2 Average Risk   3.4   3.3  Average Risk       5.0   4.4  2 X Average Risk   9.6   7.1  3 X Average Risk  23.4   11.0        Use the calculated Patient Ratio above and the CHD Risk Table to determine the patient's CHD Risk.        ATP III CLASSIFICATION (LDL):  <100     mg/dL   Optimal  100-129  mg/dL   Near or Above                    Optimal  130-159  mg/dL   Borderline  160-189  mg/dL   High  >190     mg/dL   Very High   CREATININE 6.41 (H) 03/20/2018       Scheduled Meds: . Chlorhexidine Gluconate Cloth  6 each Topical Q0600  . Chlorhexidine Gluconate Cloth  6 each Topical Q0600  . cyclophosphamide  50 mg Oral Daily  . Darbepoetin Alfa  40 mcg Intravenous Q Thu-HD  . famotidine  20 mg Oral Daily  . folic acid  1 mg Oral Daily  .  insulin aspart  0-9 Units Subcutaneous Q4H  . insulin glargine  5 Units Subcutaneous Daily  . multivitamin with minerals  1 tablet Oral Daily  . predniSONE  40 mg Oral Q breakfast  . [START ON 03/21/2018] sulfamethoxazole-trimethoprim  1 tablet Oral Once per day on Mon Wed Fri  . thiamine  100 mg Oral Daily    Or  . thiamine  100 mg Intravenous Daily   Continuous Infusions: . cefTAZidime (FORTAZ)  IV    . heparin 1,000 Units/hr (03/20/18 0651)  . [START ON 03/22/2018] vancomycin (VANCOCIN) 750 mg/140mL IVPB    . vancomycin       LOS: 0 days    Time spent: >30 MINS    Reyne Dumas  [facility] Pager [phone]. If 7PM-7AM, please contact night-coverage at [email or web address], password [facility] 03/20/2018, 12:56 PM  LOS: 0 days

## 2018-03-20 NOTE — Plan of Care (Signed)
Education given to patient, verbalized understanding, pt kept NPO per order.

## 2018-03-20 NOTE — Care Management Note (Addendum)
Case Management Note  Patient Details  Name: ALIANYS CHACKO MRN: [id number] Date of Birth: May 05, 1956  Subjective/Objective:    Pt presents with SOB,recently admitted 6/5-7/6) for acute renal failure/ new dialysis TTS and bilateral pneumonia. Resides with son, Vonna Kotyk.  PTA  independent with ADL'S , no DME usage. Pt states has been approved for medicaid since previous hospitalization. Home health services (PT) which was referred  with prior admission per [facility] liaison case never opened 2/2 pt without PCP.  Trella Thurmond (Son) Loel Dubonnet [phone]      Action/Plan: Transition to home when medically stable, r/o DVT/PE....NCM following for disposition needs.  NCM scheduled post hospital f/u with [facility], 04/22/2018 @ 11:00 ZELDA FLEMMING NP. NCM to make pt aware.  Expected Discharge Date:                  Expected Discharge Plan:  [place]  In-House Referral:     Discharge planning Services     Post Acute Care Choice:   Choice offered to:  Patient  DME Arranged:    DME Agency:     HH Arranged:    [place] Agency:     Status of Service:  In process, will continue to follow  If discussed at Long Length of Stay Meetings, dates discussed:    Additional Comments:  Sharin Mons, RN 03/20/2018, 10:15 AM

## 2018-03-20 NOTE — Consult Note (Addendum)
Renal Service Consult Note [facility]  Rhonda Lynch 03/20/2018 [name] Requesting Physician:  Dr Allyson Sabal  Reason for Consult:  ESRD pt w/ abdominal pain HPI: The patient is a 62 y.o. year-old presenting w/ abd pain, nausea and vomiting, subjective fevers and chills as well.  She was recently in hospital for severe PNA x 1 month and had renal failure and started on dialysis.  Was dc'd 5 days ago and went to OP HD once 2 days ago.  Asked to see for dialysis.  Prior recent admit from June 5 - July 6th >> pt w/ hx cocaine/ etoh abuse admitted for abd pain/ n/v, had strep pneumoniae PNA and AKI.  Renal biopsy showed MPO+ RPGN.  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 She was hep C +.  She was treated w/ plasampheresis, dialysis, IV steroids and dc'd on po pred and po cytoxan for renal failure.  AV graft was placed on 6/26 f/b hematoma evacuation.   Patient describes epigastric and bilat UQ abd pain < 2 days, +n/v, + feels "lousy".    UA 7/10 > >300 protein, > 50 rbc's 0-5 wbcs CXR 7/10 > Diffuse interstitial edema is again noted without significant change. Small bilateral pleural effusions. V/Q scan low prob for PE CT abdomen > Bibasilar ground-glass opacities with interlobular and intralobular septal thickening sparing the subpleural lung. Findings can be seen in cryptogenic organizing pneumonia. Superimposed pulmonary edema would be difficult to entirely exclude given presence of new bilateral small pleural effusions, right greater than left.   ROS  denies CP  no joint pain   no HA  no blurry vision  no rash  no diarrhea  no nausea/ vomiting  no dysuria  no difficulty voiding  no change in urine color    Past Medical History History reviewed. No pertinent past medical history. Past Surgical History  Past Surgical History:  Procedure Laterality Date  . AV FISTULA PLACEMENT Left 03/05/2018   Procedure:  ARTERIOVENOUS (AV) FISTULA CREATION  LEFT UPPER EXTREMITY;  Surgeon: [name], MD;  Location: [place];  Service: Vascular;  Laterality: Left;  . HEMATOMA EVACUATION Left 03/06/2018   Procedure: EVACUATION HEMATOMA LEFT ARM;  Surgeon: Angelia Mould, MD;  Location: [place];  Service: Vascular;  Laterality: Left;  . IR FLUORO GUIDE CV LINE RIGHT  02/18/2018  . IR FLUORO GUIDE CV LINE RIGHT  02/26/2018  . IR US GUIDE VASC ACCESS RIGHT  02/18/2018   Family History  Family History  Problem Relation Age of Onset  . Autoimmune disease Neg Hx    Social History  reports that she has been smoking.  She has been smoking about 1.00 pack per day. She has never used smokeless tobacco. She reports that she drinks alcohol. She reports that she has current or past drug history. Drug: Cocaine. Allergies No Known Allergies Home medications Prior to Admission medications   Medication Sig Start Date End Date Taking? Authorizing Provider  acetaminophen (TYLENOL) 325 MG tablet Take 325-650 mg by mouth every 6 (six) hours as needed (for pain or headaches).   Yes [provider]  Ceftazidime (FORTAZ) 2 g SOLR injection Inject 2 g into the vein Every Tuesday,Thursday,and Saturday with dialysis. 03/15/18  Yes Elgergawy, Silver Huguenin, MD  cyclophosphamide (CYTOXAN) 50 MG capsule Take 1 capsule (50 mg total) by mouth daily. Give on an empty stomach 1 hour before or 2 hours after meals. 03/16/18  Yes Elgergawy, Silver Huguenin, MD  famotidine (PEPCID) 20 MG tablet Take 1 tablet (20 mg total) by mouth daily. 03/16/18  Yes Elgergawy, Silver Huguenin, MD  Insulin Glargine (LANTUS) 100 UNIT/ML Solostar Pen Inject 5 Units into the skin daily. Patient taking differently: Inject 5 Units into the skin at bedtime.  03/15/18  Yes Elgergawy, Silver Huguenin, MD  Multiple Vitamin (MULTIVITAMIN WITH MINERALS) TABS tablet Take 1 tablet by mouth daily. 03/16/18  Yes Elgergawy, Silver Huguenin, MD  predniSONE (DELTASONE) 20 MG tablet Take 2 tablets (40 mg total) by  mouth daily with breakfast. 03/16/18  Yes Elgergawy, Silver Huguenin, MD  sulfamethoxazole-trimethoprim (BACTRIM DS,SEPTRA DS) 800-160 MG tablet Take 1 tablet by mouth 3 (three) times a week. Patient taking differently: Take 1 tablet by mouth every Monday, Wednesday, and Friday.  03/17/18  Yes Elgergawy, Silver Huguenin, MD  thiamine 100 MG tablet Take 1 tablet (100 mg total) by mouth daily. 03/16/18  Yes Elgergawy, Silver Huguenin, MD  vancomycin 750 mg in sodium chloride 0.9 % 150 mL Inject 750 mg into the vein Every Tuesday,Thursday,and Saturday with dialysis. 03/15/18  Yes Elgergawy, Silver Huguenin, MD  Darbepoetin Alfa (ARANESP) 40 MCG/0.4ML SOSY injection Inject 0.4 mLs (40 mcg total) into the vein every Thursday with hemodialysis. 03/20/18   Elgergawy, Silver Huguenin, MD   Liver Function Tests Recent Labs  Lab 03/15/18 0800 03/19/18 1820  AST  --  20  ALT  --  20  ALKPHOS  --  54  BILITOT  --  1.0  PROT  --  5.1*  ALBUMIN 2.9* 2.9*   Recent Labs  Lab 03/19/18 1820  LIPASE 24   CBC Recent Labs  Lab 03/15/18 0749 03/19/18 1820 03/19/18 1834 03/20/18 0534  WBC 4.3 6.1  --  7.4  NEUTROABS  --  5.5  --   --   HGB 8.4* 7.9* 8.2* 7.3*  HCT 25.6* 24.2* 24.0* 23.1*  MCV 81.8 82.6  --  84.0  PLT 117* 129*  --  092*   Basic Metabolic Panel Recent Labs  Lab 03/15/18 0800 03/19/18 1820 03/19/18 1834 03/20/18 0534  NA 134* 142 140 142  K 4.0 3.8 3.7 3.8  CL 99 104 102 102  CO2 26 26  --  25  GLUCOSE 148* 212* 212* 106*  BUN 50* 38* 37* 51*  CREATININE 5.16* 4.92* 5.10* 6.41*  CALCIUM 7.9* 7.7*  --  7.7*  PHOS 3.5  --   --   --    Iron/TIBC/Ferritin/ %Sat    Component Value Date/Time   IRON 82 03/02/2018 0355   TIBC 99 (L) 03/02/2018 0355   FERRITIN 320 (H) 03/02/2018 0355   IRONPCTSAT 82 (H) 03/02/2018 0355    Vitals:   03/20/18 0341 03/20/18 0507 03/20/18 0536 03/20/18 0742  BP: 137/81 (!) 171/97 (!) 142/97 (!) 156/75  Pulse: (!) 59 64 71 70  Resp: 20     Temp: 98 F (36.7 C)   98.9 F (37.2  C)  TempSrc: Oral   Oral  SpO2: 98% 98% 97% 94%  Weight:      Height:       Exam Gen restless, uncomfortable no distress, 3L [place] O2 No rash, cyanosis or gangrene Sclera anicteric, throat clear  No jvd or bruits Chest clear on L , dec'd BS on R, no wheezing RRR no MRS Abd soft diffusely tender, no rebound, +bs no mass or ascites GU defer MS no joint effusions or deformity Ext mild R ankle edema, no  LLE edema, no erythema or wounds Neuro is alert, Ox 3 , nf    Home meds:  - cytoxan 50 qd/ pred 40 mg qd/ Bactrim DS 1 tiw  - lantus 5 u qd  - PPI  - Vanc 750 mg and Fortaz 2 gm IV at HD TTS  Dialysis: [facility]  4h   400/A1.5     69.5kg  2/2.25 bath  Heparin 2000 - mircera 50 every 2 , start 7/11 - calcitriol 0.25 ug tiw     Impression: 1  Abdominal pain/ nausea/ vomiting - unclear cause, CT abd w/o acute findings.  Per primary.   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  Was treated w/ plasmapheresis x 7, IV steroid bolus and po cytoxan. Is making urine but creat not down. Will need continued dialysis.   She was dc'd on cytoxan 50 mg daily, pred taper; continue for now.  3  Abnormal CXR/ CT: CXR changes of last admit seemed to resolved by 6/27 cxr now are looking like recurrence of prior process at least radiographically.  These changes most likely not due to fluid overload given the unusual pattern distribution.   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 Was treated w/ steroids.  5  SP LUA AVF 6/26 by VVS 6  Volume - not grossly vol overloaded on exam, pull fluid as tol, up 3kg 7  ID - will dc vanc and fortaz, the blood cx's from 7/5 are negative to date   Plan - HD today upstairs, UF 3L as tol; dc IV abx as inpt blood cx's are all negative  Kelly Splinter MD [facility] pager [phone]    03/20/2018, 10:57 AM

## 2018-03-20 NOTE — Progress Notes (Signed)
Preliminary results by tech - Venous Duplex Lower Ext. Right Completed. Negative for deep and superficial vein thrombosis. Oda Cogan, BS, RDMS, RVT

## 2018-03-21 DIAGNOSIS — J189 Pneumonia, unspecified organism: Secondary | ICD-10-CM | POA: Diagnosis not present

## 2018-03-21 DIAGNOSIS — N2581 Secondary hyperparathyroidism of renal origin: Secondary | ICD-10-CM | POA: Diagnosis present

## 2018-03-21 DIAGNOSIS — E669 Obesity, unspecified: Secondary | ICD-10-CM | POA: Diagnosis present

## 2018-03-21 DIAGNOSIS — Z992 Dependence on renal dialysis: Secondary | ICD-10-CM | POA: Diagnosis not present

## 2018-03-21 DIAGNOSIS — J8489 Other specified interstitial pulmonary diseases: Principal | ICD-10-CM

## 2018-03-21 DIAGNOSIS — D631 Anemia in chronic kidney disease: Secondary | ICD-10-CM | POA: Diagnosis present

## 2018-03-21 DIAGNOSIS — F1410 Cocaine abuse, uncomplicated: Secondary | ICD-10-CM | POA: Diagnosis present

## 2018-03-21 DIAGNOSIS — K2920 Alcoholic gastritis without bleeding: Secondary | ICD-10-CM | POA: Diagnosis present

## 2018-03-21 DIAGNOSIS — D62 Acute posthemorrhagic anemia: Secondary | ICD-10-CM | POA: Diagnosis present

## 2018-03-21 DIAGNOSIS — G4700 Insomnia, unspecified: Secondary | ICD-10-CM | POA: Diagnosis present

## 2018-03-21 DIAGNOSIS — I288 Other diseases of pulmonary vessels: Secondary | ICD-10-CM

## 2018-03-21 DIAGNOSIS — F1010 Alcohol abuse, uncomplicated: Secondary | ICD-10-CM | POA: Diagnosis present

## 2018-03-21 DIAGNOSIS — N179 Acute kidney failure, unspecified: Secondary | ICD-10-CM | POA: Diagnosis present

## 2018-03-21 DIAGNOSIS — J9601 Acute respiratory failure with hypoxia: Secondary | ICD-10-CM | POA: Diagnosis present

## 2018-03-21 DIAGNOSIS — F17210 Nicotine dependence, cigarettes, uncomplicated: Secondary | ICD-10-CM | POA: Diagnosis present

## 2018-03-21 DIAGNOSIS — Z79899 Other long term (current) drug therapy: Secondary | ICD-10-CM | POA: Diagnosis not present

## 2018-03-21 DIAGNOSIS — B182 Chronic viral hepatitis C: Secondary | ICD-10-CM | POA: Diagnosis present

## 2018-03-21 DIAGNOSIS — R0902 Hypoxemia: Secondary | ICD-10-CM

## 2018-03-21 DIAGNOSIS — Y95 Nosocomial condition: Secondary | ICD-10-CM | POA: Diagnosis present

## 2018-03-21 DIAGNOSIS — D696 Thrombocytopenia, unspecified: Secondary | ICD-10-CM | POA: Diagnosis present

## 2018-03-21 DIAGNOSIS — T380X5A Adverse effect of glucocorticoids and synthetic analogues, initial encounter: Secondary | ICD-10-CM | POA: Diagnosis present

## 2018-03-21 DIAGNOSIS — R748 Abnormal levels of other serum enzymes: Secondary | ICD-10-CM | POA: Diagnosis not present

## 2018-03-21 DIAGNOSIS — E1122 Type 2 diabetes mellitus with diabetic chronic kidney disease: Secondary | ICD-10-CM | POA: Diagnosis present

## 2018-03-21 DIAGNOSIS — N186 End stage renal disease: Secondary | ICD-10-CM | POA: Diagnosis present

## 2018-03-21 DIAGNOSIS — J810 Acute pulmonary edema: Secondary | ICD-10-CM | POA: Diagnosis present

## 2018-03-21 DIAGNOSIS — T387X5A Adverse effect of androgens and anabolic congeners, initial encounter: Secondary | ICD-10-CM | POA: Diagnosis present

## 2018-03-21 DIAGNOSIS — R0602 Shortness of breath: Secondary | ICD-10-CM | POA: Diagnosis not present

## 2018-03-21 DIAGNOSIS — J154 Pneumonia due to other streptococci: Secondary | ICD-10-CM | POA: Diagnosis present

## 2018-03-21 DIAGNOSIS — N059 Unspecified nephritic syndrome with unspecified morphologic changes: Secondary | ICD-10-CM | POA: Diagnosis present

## 2018-03-21 LAB — CBC
HCT: 24 % — ABNORMAL LOW (ref 36.0–46.0)
Hemoglobin: 7.7 g/dL — ABNORMAL LOW (ref 12.0–15.0)
MCH: 27 pg (ref 26.0–34.0)
MCHC: 32.1 g/dL (ref 30.0–36.0)
MCV: 84.2 fL (ref 78.0–100.0)
Platelets: 112 10*3/uL — ABNORMAL LOW (ref 150–400)
RBC: 2.85 MIL/uL — ABNORMAL LOW (ref 3.87–5.11)
RDW: 19.1 % — ABNORMAL HIGH (ref 11.5–15.5)
WBC: 7.8 10*3/uL (ref 4.0–10.5)

## 2018-03-21 LAB — COMPREHENSIVE METABOLIC PANEL
ALBUMIN: 2.8 g/dL — AB (ref 3.5–5.0)
ALK PHOS: 48 U/L (ref 38–126)
ALT: 16 U/L (ref 0–44)
ANION GAP: 12 (ref 5–15)
AST: 21 U/L (ref 15–41)
BUN: 26 mg/dL — ABNORMAL HIGH (ref 8–23)
CALCIUM: 7.8 mg/dL — AB (ref 8.9–10.3)
CHLORIDE: 99 mmol/L (ref 98–111)
CO2: 28 mmol/L (ref 22–32)
Creatinine, Ser: 3.65 mg/dL — ABNORMAL HIGH (ref 0.44–1.00)
GFR calc Af Amer: 14 mL/min — ABNORMAL LOW (ref >60)
GFR calc non Af Amer: 12 mL/min — ABNORMAL LOW (ref >60)
GLUCOSE: 97 mg/dL (ref 70–99)
Potassium: 4.4 mmol/L (ref 3.5–5.1)
SODIUM: 139 mmol/L (ref 135–145)
Total Bilirubin: 1 mg/dL (ref 0.3–1.2)
Total Protein: 5.1 g/dL — ABNORMAL LOW (ref 6.5–8.1)

## 2018-03-21 LAB — GLUCOSE, CAPILLARY
GLUCOSE-CAPILLARY: 125 mg/dL — AB (ref 70–99)
GLUCOSE-CAPILLARY: 249 mg/dL — AB (ref 70–99)
Glucose-Capillary: 91 mg/dL (ref 70–99)
Glucose-Capillary: 138 mg/dL — ABNORMAL HIGH (ref 70–99)
Glucose-Capillary: 219 mg/dL — ABNORMAL HIGH (ref 70–99)
Glucose-Capillary: 291 mg/dL — ABNORMAL HIGH (ref 70–99)

## 2018-03-21 MED ORDER — CHLORHEXIDINE GLUCONATE CLOTH 2 % EX PADS: 6.0000 | MEDICATED_PAD | Freq: Every day | CUTANEOUS | Status: DC

## 2018-03-21 MED ORDER — FAMOTIDINE 20 MG PO TABS
20.0000 mg | ORAL_TABLET | Freq: Every day | ORAL | Status: DC
  Administered 2018-03-21 – 2018-03-24 (×4): 20 mg via ORAL
  Filled 2018-03-21 (×4): qty 1

## 2018-03-21 MED ORDER — PREDNISONE 20 MG PO TABS
40.0000 mg | ORAL_TABLET | Freq: Two times a day (BID) | ORAL | Status: DC
  Administered 2018-03-21 – 2018-03-24 (×6): 40 mg via ORAL
  Filled 2018-03-21 (×6): qty 2

## 2018-03-21 MED ORDER — CYCLOPHOSPHAMIDE 50 MG PO CAPS
50.0000 mg | ORAL_CAPSULE | Freq: Every day | ORAL | Status: DC
  Administered 2018-03-22: 50 mg via ORAL
  Filled 2018-03-21 (×3): qty 1

## 2018-03-21 MED ORDER — PREDNISONE 20 MG PO TABS
40.0000 mg | ORAL_TABLET | Freq: Every day | ORAL | Status: DC
Start: 2018-03-26 — End: 2018-03-24

## 2018-03-21 NOTE — Evaluation (Signed)
Physical Therapy Evaluation Patient Details Name: Rhonda Lynch MRN: [id number] DOB: 1956/01/13 Today's Date: 03/21/2018   History of Present Illness  The patient is a 62 y.o. year-old presenting w/ abd pain, nausea and vomiting, subjective fevers and chills as well x 2 days.  She was recently in hospital for severe PNA x 1 month and had renal failure and started on dialysis.  Was dc'd 6 days ago and went to OP HD once 2 days ago. Hx of polysubstance abuse  Clinical Impression  Pt admitted with above diagnosis. Pt currently with functional limitations due to the deficits listed below (see PT Problem List). Pt was able to ambulate but did need rest breaks in standing as well as needed 3LO2 to keep sats >90%.  Fairly steady without device but pt would do best with rollator and pt agrees.  Will follow acutely.  Pt will benefit from skilled PT to increase their independence and safety with mobility to allow discharge to the venue listed below.      Follow Up Recommendations Home health PT;Supervision - Intermittent(would benefit but she says she can't pay copay)    Equipment Recommendations  (rollator, home O2)    Recommendations for Other Services       Precautions / Restrictions Precautions Precautions: Fall Precaution Comments: watch O2 Restrictions Weight Bearing Restrictions: No      Mobility  Bed Mobility Overal bed mobility: Modified Independent Bed Mobility: Supine to Sit;Sit to Supine     Supine to sit: Modified independent (Device/Increase time);HOB elevated Sit to supine: Modified independent (Device/Increase time);HOB elevated   General bed mobility comments: pt long sitting in bed on entry and sat EoB with mod I  Transfers Overall transfer level: Needs assistance Equipment used: None Transfers: Sit to/from Stand Sit to Stand: Supervision            Ambulation/Gait Ambulation/Gait assistance: Min guard Gait Distance (Feet): 350 Feet Assistive device:  None Gait Pattern/deviations: Step-through pattern;Decreased stride length Gait velocity: Decreased Gait velocity interpretation: 1.31 - 2.62 ft/sec, indicative of limited community ambulator General Gait Details: Slow, intermittently unsteady amb with min guard; able to steady herself without assist Took several standing rest breaks and cued to purse lip breathe.   Stairs            Wheelchair Mobility    Modified Rankin (Stroke Patients Only)       Balance Overall balance assessment: Needs assistance Sitting-balance support: No upper extremity supported;Feet supported Sitting balance-Leahy Scale: Good     Standing balance support: No upper extremity supported;During functional activity Standing balance-Leahy Scale: Fair Standing balance comment: needed 1UE support at times for balance                              Pertinent Vitals/Pain Pain Assessment: No/denies pain    Home Living Family/patient expects to be discharged to:: Private residence Living Arrangements: Children Available Help at Discharge: Family;Available PRN/intermittently Type of Home: Apartment Home Access: Stairs to enter(pt lives on 3rd floor, no elevator) Entrance Stairs-Rails: None Entrance Stairs-Number of Steps: 3 flights Home Layout: One level Home Equipment: None      Prior Function Level of Independence: Independent         Comments: Reports no driving or working, son does grocery shopping     Hand Dominance   Dominant Hand: Right    Extremity/Trunk Assessment   Upper Extremity Assessment Upper Extremity Assessment: Defer to OT evaluation  Lower Extremity Assessment Lower Extremity Assessment: Generalized weakness    Cervical / Trunk Assessment Cervical / Trunk Assessment: Normal  Communication   Communication: No difficulties  Cognition Arousal/Alertness: Awake/alert Behavior During Therapy: WFL for tasks assessed/performed Overall Cognitive Status:  Within Functional Limits for tasks assessed                                        General Comments General comments (skin integrity, edema, etc.): Initial vitals were 76 bpm and 92% on 2LO2.  At rest, sats 88-89% at rest.  Desat on RA to 84% with activity.  Needed 3LO2 to keep sats >94% with activity.  HR up to 120 bpm.  Gave pt energy conservation technique handout as pt fatigues quickly.  Discussed strategies as well.     Exercises General Exercises - Lower Extremity Ankle Circles/Pumps: AROM;Both;Supine Quad Sets: AROM;Both;10 reps;Supine Long Arc Quad: AROM;Both;10 reps;Seated   Assessment/Plan    PT Assessment Patient needs continued PT services  PT Problem List Cardiopulmonary status limiting activity;Decreased strength;Decreased activity tolerance;Decreased balance;Decreased mobility;Decreased coordination;Decreased knowledge of use of DME;Decreased safety awareness       PT Treatment Interventions DME instruction;Gait training;Stair training;Functional mobility training;Therapeutic activities;Therapeutic exercise;Balance training;Neuromuscular re-education    PT Goals (Current goals can be found in the Care Plan section)  Acute Rehab PT Goals Patient Stated Goal: Go home PT Goal Formulation: With patient Time For Goal Achievement: 04/04/18 Potential to Achieve Goals: Good    Frequency Min 3X/week   Barriers to discharge Decreased caregiver support      Co-evaluation               AM-PAC PT "6 Clicks" Daily Activity  Outcome Measure Difficulty turning over in bed (including adjusting bedclothes, sheets and blankets)?: None Difficulty moving from lying on back to sitting on the side of the bed? : None Difficulty sitting down on and standing up from a chair with arms (e.g., wheelchair, bedside commode, etc,.)?: None Help needed moving to and from a bed to chair (including a wheelchair)?: None Help needed walking in hospital room?: A Little Help  needed climbing 3-5 steps with a railing? : A Little 6 Click Score: 22    End of Session Equipment Utilized During Treatment: Gait belt;Oxygen Activity Tolerance: Patient tolerated treatment well Patient left: in bed;with call bell/phone within reach;with bed alarm set Nurse Communication: Mobility status PT Visit Diagnosis: Unsteadiness on feet (R26.81);Difficulty in walking, not elsewhere classified (R26.2)    Time: 3532-9924 PT Time Calculation (min) (ACUTE ONLY): 33 min   Charges:   PT Evaluation $PT Eval Moderate Complexity: 1 Mod PT Treatments $Gait Training: 8-22 mins   PT G Codes:        Rhonda Lynch,PT Acute Rehabilitation [phone] (pager)   Rhonda Lynch 03/21/2018, 3:32 PM

## 2018-03-21 NOTE — Progress Notes (Signed)
SATURATION QUALIFICATIONS: (This note is used to comply with regulatory documentation for home oxygen)  Patient Saturations on Room Air at Rest = 88%  Patient Saturations on Room Air while Ambulating = 84%  Patient Saturations on 3 Liters of oxygen while Ambulating = 90%  Please briefly explain why patient needs home oxygen:Pt desats on RA at rest and with activity and will need home O2.  Thanks.  [place] [phone] (pager)

## 2018-03-21 NOTE — Progress Notes (Signed)
PROGRESS NOTE  Rhonda Lynch  YQI:[id number] DOB: March 11, 1956 DOA: 03/19/2018 PCP: Patient, No Pcp Per   Brief Narrative: Per HPI: 62 y.o.femalewith medical history significant ofCocaine and EtOH abuse. Recent admit to our service for a month long hospital stay this past month, discharged 4 days ago.62 year old female with past medical history of cocaine/alcohol abuse who was admitted on 02/12/2018 with abdominal pain, nausea, vomiting. She was found to have Streptococcus pneumonia and also had acute kidney injury on presentation. Nephrology was consulted . 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 She has progressed to end-stage renal disease and is currently undergoing dialysis, plasmapheresis and has been started, on steroids and cyclophosphamide. Discharge planning after setting of outpatient dialysis facility. 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 VQ scan low probability.  - Pulmonology consulted, treating BOOP as below.  - Continue supplemental oxygen as needed  BOOP: Previously considered diffuse alveolar hemorrhage vs. BOOP. No current hemoptysis. Bibasilar GGOs on CT abd/pelvis seen at bases and diffuse interstitial edema on CXR here worse than last study.  - Plan 6 month taper of prednisone, currently increased to 40mg  po BID per pulm - Monitor clinically and repeat CXR. Once improving will repeat ambulatory  pulse oximetry.   MPO+, ANCA+ RPGN and fibrillary glomerulonephritis: Dx by biopsy. s/p plasmapheresis at previous admission. Discharged on steroids and cytoxan daily but has not been taking prednisone appropriately for unclear reasons and has not received mail-order cytoxan.  - Will continue cytoxan at 50mg  dose qHS (needs to be given after HD per nephrology/pharmacy) - On bactrim ppx with immunosuppression.   ESRD: Due to RPGN Dx on biopsy, s/p plasmapheresis x 7 days.   - Nephrology following, routine HD (recently CLIPPED for TTS) thru cath while AVF matures (placed 6/26 by Dr. Donnetta Hutching (required hematoma evacuation 6/27).   Fever: During previous admission, was placed on empiric broad specturm abx while cultures pending.  - Cultures negative and currently afebrile, will DC antibiotics  Abdominal pain, nausea, vomiting: Improved. CT abd/pelvis reassuring. Lipase wnl. Has had cholecystectomy. - Continue pepcid BID given ongoing steroids.  - Miralax prn.   Troponin elevation: Mild in setting of ESRD and hypoxia, no ischemic ECG changes at admission; not consistent with ACS. Initially on heparin, this has been stopped. VQ scan low probability for PE.  - No further interventions planned.   T2DM: HbA1c 6.8%. Anticipate worsening with steroids.  - Continue lantus, SSI  History of cocaine use: UDS negative at this admission (positive last admission) - Cessation counseling reviewed.  Anemia of chronic kidney disease and acute blood loss anemia: Transfused 2 units during her most recent admission - [place] weekly per nephrology.  - Monitor CBC, anemia panel  Chronic hepatitis C:  - ID outpatient follow up  Thrombocytopenia:  - Monitor, still well above 50k so can continue DVT ppx.  DVT prophylaxis: Heparin Code Status: Full Family Communication: None at bedside, declined offer to call Disposition Plan: Uncertain, but will require continued inpatient treatment. PT recommending HH-PT  currently. Will need to complete work  up and be treated for new oxygen requirement prior to discharge.   Consultants:   Nephrology  Pulmonology  Procedures:   HD 7/11  Antimicrobials:  Vancomycin, ceftazidime 7/11   Subjective: N/V improved but still quite short of breath even at rest, worse with exertion. This is worse than her baseline. Has some lower chest/upper abdominal pain which is improved or at least stable.   Objective: Vitals:   03/20/18 2011 03/21/18 0414 03/21/18 0748 03/21/18 1510  BP: (!) 144/79 (!) 159/78 (!) 156/83 (!) 160/82  Pulse: 78 75 80 77  Resp:      Temp: 99 F (37.2 C) 98.8 F (37.1 C) 98.9 F (37.2 C) 98.6 F (37 C)  TempSrc: Oral Oral Oral Oral  SpO2: 93% 92% 94% 97%  Weight:  69.9 kg (154 lb)    Height:        Intake/Output Summary (Last 24 hours) at 03/21/2018 1640 Last data filed at 03/21/2018 0840 Gross per 24 hour  Intake 800 ml  Output 2675 ml  Net -1875 ml   Filed Weights   03/20/18 1345 03/20/18 1755 03/21/18 0414  Weight: 71 kg (156 lb 8.4 oz) 68.9 kg (151 lb 14.4 oz) 69.9 kg (154 lb)    Gen: Chronically ill-appearing female in no distress Pulm: Non-labored with supplemental oxygen, decreased with rhonchi bibasilar, no wheezing CV: Regular rate and rhythm. No murmur, rub, or gallop. No JVD, trace pedal edema. GI: Abdomen soft, non-tender, non-distended, with normoactive bowel sounds. No organomegaly or masses felt. Ext: Warm, no deformities Skin: No rashes, lesions or ulcers Neuro: Alert and oriented. No focal neurological deficits. Psych: Judgement and insight appear normal. Mood & affect appropriate.   Data Reviewed: I have personally reviewed following labs and imaging studies  CBC: Recent Labs  Lab 03/15/18 0749 03/19/18 1820 03/19/18 1834 03/20/18 0534 03/21/18 0329  WBC 4.3 6.1  --  7.4 7.8  NEUTROABS  --  5.5  --   --   --   HGB 8.4* 7.9* 8.2* 7.3* 7.7*  HCT 25.6* 24.2* 24.0* 23.1* 24.0*  MCV 81.8 82.6   --  84.0 84.2  PLT 117* 129*  --  135* 811*   Basic Metabolic Panel: Recent Labs  Lab 03/15/18 0800 03/19/18 1820 03/19/18 1834 03/20/18 0534 03/21/18 0329  NA 134* 142 140 142 139  K 4.0 3.8 3.7 3.8 4.4  CL 99 104 102 102 99  CO2 26 26  --  25 28  GLUCOSE 148* 212* 212* 106* 97  BUN 50* 38* 37* 51* 26*  CREATININE 5.16* 4.92* 5.10* 6.41* 3.65*  CALCIUM 7.9* 7.7*  --  7.7* 7.8*  PHOS 3.5  --   --   --   --    GFR: Estimated Creatinine Clearance: 13.9 mL/min (A) (by C-G formula based on SCr of 3.65 mg/dL (H)). Liver Function Tests: Recent Labs  Lab 03/15/18 0800 03/19/18 1820 03/21/18 0329  AST  --  20 21  ALT  --  20 16  ALKPHOS  --  54 48  BILITOT  --  1.0 1.0  PROT  --  5.1* 5.1*  ALBUMIN 2.9* 2.9* 2.8*   Recent Labs  Lab 03/19/18 1820  LIPASE 24   No results for input(s): AMMONIA in the last 168 hours. Coagulation Profile: No results for input(s): INR, PROTIME in the last 168 hours. Cardiac Enzymes: Recent Labs  Lab 03/19/18 2140 03/20/18 0534 03/20/18 1011  TROPONINI 0.13* 0.11* 0.09*   BNP (last 3 results)  No results for input(s): PROBNP in the last 8760 hours. HbA1C: No results for input(s): HGBA1C in the last 72 hours. CBG: Recent Labs  Lab 03/20/18 2009 03/21/18 0018 03/21/18 0417 03/21/18 0748 03/21/18 1157  GLUCAP 232* 125* 91 138* 219*   Lipid Profile: No results for input(s): CHOL, HDL, LDLCALC, TRIG, CHOLHDL, LDLDIRECT in the last 72 hours. Thyroid Function Tests: No results for input(s): TSH, T4TOTAL, FREET4, T3FREE, THYROIDAB in the last 72 hours. Anemia Panel: No results for input(s): VITAMINB12, FOLATE, FERRITIN, TIBC, IRON, RETICCTPCT in the last 72 hours. Urine analysis:    Component Value Date/Time   COLORURINE YELLOW 03/19/2018 1738   APPEARANCEUR HAZY (A) 03/19/2018 1738   LABSPEC 1.014 03/19/2018 1738   PHURINE 6.0 03/19/2018 1738   GLUCOSEU 50 (A) 03/19/2018 1738   HGBUR LARGE (A) 03/19/2018 1738   HGBUR  trace-intact 03/08/2010 1452   BILIRUBINUR NEGATIVE 03/19/2018 1738   KETONESUR NEGATIVE 03/19/2018 1738   PROTEINUR >=300 (A) 03/19/2018 1738   UROBILINOGEN 0.2 03/08/2010 1452   NITRITE NEGATIVE 03/19/2018 1738   LEUKOCYTESUR TRACE (A) 03/19/2018 1738   Recent Results (from the past 240 hour(s))  Culture, blood (Routine X 2) w Reflex to ID Panel     Status: None   Collection Time: 03/14/18  8:11 AM  Result Value Ref Range Status   Specimen Description BLOOD RIGHT ARM  Final   Special Requests   Final    BOTTLES DRAWN AEROBIC AND ANAEROBIC Blood Culture adequate volume   Culture   Final    NO GROWTH 5 DAYS Performed at [facility], [place] [address] [place], [place] [address]    Report Status 03/19/2018 FINAL  Final  Culture, blood (Routine X 2) w Reflex to ID Panel     Status: None   Collection Time: 03/14/18  8:25 AM  Result Value Ref Range Status   Specimen Description BLOOD RIGHT HAND  Final   Special Requests   Final    BOTTLES DRAWN AEROBIC AND ANAEROBIC Blood Culture adequate volume   Culture   Final    NO GROWTH 5 DAYS Performed at [facility], [place] [address], [place], [place] [address]    Report Status 03/19/2018 FINAL  Final  C difficile quick scan w PCR reflex     Status: None   Collection Time: 03/14/18  9:24 AM  Result Value Ref Range Status   C Diff antigen NEGATIVE NEGATIVE Final   C Diff toxin NEGATIVE NEGATIVE Final   C Diff interpretation No C. difficile detected.  Final    Comment: Performed at [facility], [place] [address] [place], [place] [address]  Culture, blood (single)     Status: None   Collection Time: 03/14/18  4:39 PM  Result Value Ref Range Status   Specimen Description BLOOD CENTRAL LINE  Final   Special Requests   Final    BOTTLES DRAWN AEROBIC AND ANAEROBIC Blood Culture results may not be optimal due to an inadequate volume of blood received in culture bottles   Culture   Final    NO GROWTH 5 DAYS Performed at [facility], [place] [address] [place] [address], [place] [address]    Report Status 03/19/2018 FINAL  Final  Culture, blood (single)     Status: None   Collection Time: 03/14/18  4:39 PM  Result Value Ref Range Status   Specimen Description BLOOD CENTRAL LINE  Final   Special Requests   Final  BOTTLES DRAWN AEROBIC AND ANAEROBIC Blood Culture adequate volume   Culture   Final    NO GROWTH 5 DAYS Performed at [facility], [place] [address] [place], [place] [address]    Report Status 03/19/2018 FINAL  Final  Urine culture     Status: None   Collection Time: 03/19/18  5:38 PM  Result Value Ref Range Status   Specimen Description URINE, CLEAN CATCH  Final   Special Requests NONE  Final   Culture   Final    NO GROWTH Performed at [facility], [place] [address], [place], [place] [address]    Report Status 03/20/2018 FINAL  Final      Radiology Studies: Ct Abdomen Pelvis Wo Contrast  Result Date: 03/19/2018 CLINICAL DATA:  62 year old female with reported kidney failure, recently discharged for pneumonia presents with abdominal pain. EXAM: CT ABDOMEN AND PELVIS WITHOUT CONTRAST TECHNIQUE: Multidetector CT imaging of the abdomen and pelvis was performed following the standard protocol without IV contrast. COMPARISON:  02/12/2018 FINDINGS: Lower chest: Cardiomegaly with small bilateral right greater than left pleural effusions. Redemonstration of bilateral bibasilar ground-glass opacities with inter and intra lobular septal thickening in a crazy paving pattern that can be seen with cryptogenic organizing pneumonia. Superimposed stigmata of CHF given presence of bilateral pleural effusions is not excluded. Hepatobiliary: The unenhanced liver is normal given limitations of a noncontrast study. The patient is status post cholecystectomy. Pancreas: Normal Spleen: Normal Adrenals/Urinary Tract: Mild bilateral perinephric fat stranding without obstructive uropathy. The urinary bladder is unremarkable  for degree of distention. Stomach/Bowel: The stomach is decompressed. Normal small bowel rotation without obstruction or inflammation. The distal and terminal ileum and appendix are normal. Unremarkable appearance of the colon with average amount of fecal retention noted. Vascular/Lymphatic: Moderate aorto iliac atherosclerosis without aneurysm. No adenopathy. Reproductive: Hysterectomy. Small fat containing dermoid in the left ovary measuring 15 mm, stable in appearance. Other: No free air nor free fluid. Small fat containing umbilical hernia. Musculoskeletal: No acute or significant osseous findings. Minimal anterolisthesis grade 1 of L4 on L5 due to facet arthropathy. IMPRESSION: 1. Bibasilar ground-glass opacities with interlobular and intralobular septal thickening sparing the subpleural lung. Findings can be seen in cryptogenic organizing pneumonia. Superimposed pulmonary edema would be difficult to entirely exclude given presence of new bilateral small pleural effusions, right greater than left. 2. 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 3. Minimal anterolisthesis grade 1 of L4 on L5 secondary to degenerative facet arthropathy. Electronically Signed   By: Ashley Royalty M.D.   On: 03/19/2018 20:29   Dg Chest 2 View  Result Date: 03/19/2018 CLINICAL DATA:  Abdominal pain.  Recent dialysis shunt placement. EXAM: CHEST - 2 VIEW COMPARISON:  03/14/2018 and 03/06/2018 FINDINGS: Cardiomegaly with mild aortic atherosclerosis. Diffuse interstitial edema persists with small bilateral pleural effusions. Right IJ dialysis catheter terminates at the cavoatrial junction and is unchanged in appearance. There is pneumothorax. No acute osseous abnormality. IMPRESSION: Stable cardiomegaly with mild aortic atherosclerosis. Diffuse interstitial edema is again noted without significant change. Small bilateral pleural effusions. Electronically Signed   By: Ashley Royalty M.D.   On: 03/19/2018 17:03   Nm Pulmonary Vent And  Perf (v/q Scan)  Result Date: 03/20/2018 CLINICAL DATA:  Shortness of Breath EXAM: NUCLEAR MEDICINE VENTILATION - PERFUSION LUNG SCAN VIEWS: Anterior, posterior, left lateral, right lateral, RPO, LPO, RAO, LAO-ventilation and perfusion RADIOPHARMACEUTICALS:  31.6 mCi of Tc-61m DTPA aerosol inhalation and 4.2 mCi Tc14m-MAA IV COMPARISON:  Chest radiograph  March 19, 2018 FINDINGS: Ventilation: Radiotracer uptake is homogeneous and symmetric bilaterally. No ventilation defects are evident. Perfusion: Radiotracer uptake is homogeneous and symmetric bilaterally. No appreciable perfusion defects. IMPRESSION: No appreciable ventilation or perfusion defects. This study constitutes a very low probability of pulmonary embolus. Electronically Signed   By: Lowella Grip III M.D.   On: 03/20/2018 10:20    Scheduled Meds: . Chlorhexidine Gluconate Cloth  6 each Topical Q0600  . Chlorhexidine Gluconate Cloth  6 each Topical Q0600  . [START ON 03/22/2018] cyclophosphamide  50 mg Oral QHS  . Darbepoetin Alfa  40 mcg Intravenous Q Thu-HD  . famotidine  20 mg Oral Daily  . folic acid  1 mg Oral Daily  . heparin injection (subcutaneous)  5,000 Units Subcutaneous Q8H  . insulin aspart  0-9 Units Subcutaneous Q4H  . insulin glargine  5 Units Subcutaneous Daily  . multivitamin with minerals  1 tablet Oral Daily  . polyethylene glycol  17 g Oral BID  . predniSONE  40 mg Oral BID  . [START ON 03/26/2018] predniSONE  40 mg Oral Q breakfast  . sulfamethoxazole-trimethoprim  1 tablet Oral Once per day on Mon Wed Fri  . thiamine  100 mg Oral Daily   Or  . thiamine  100 mg Intravenous Daily   Continuous Infusions:   LOS: 0 days   Time spent: 35 minutes.  Patrecia Pour, MD [facility] [email or web address] Password [facility] 03/21/2018, 4:40 PM

## 2018-03-21 NOTE — Evaluation (Signed)
Occupational Therapy Evaluation and Discharge Patient Details Name: Rhonda Lynch MRN: [id number] DOB: 22-Sep-1955 Today's Date: 03/21/2018    History of Present Illness Pt is a 62 yo female admitted 02/17/18 with n/v and abdominal pain, pt d/c after month long stay 4 days ago. PMH: cocaine and alcohol abuse.   Clinical Impression   Prior to recent readmission, pt was independent at home, using no AD/AE to complete ADLs/functional mobility. Pt is near baseline with her independence and safety during ADL tasks, requiring distant (S) during functional mobility with no AD. Edu provided re safe transfer techniques at home and importance of having son supervise shower transfers, pt verbalized understanding. Pt demo good safety awareness during session, self identifying need for rest break.     Follow Up Recommendations  No OT follow up    Equipment Recommendations  None recommended by OT    Recommendations for Other Services       Precautions / Restrictions Precautions Precautions: Fall Restrictions Weight Bearing Restrictions: No      Mobility Bed Mobility Overal bed mobility: Modified Independent Bed Mobility: Supine to Sit;Sit to Supine     Supine to sit: Modified independent (Device/Increase time);HOB elevated Sit to supine: Modified independent (Device/Increase time);HOB elevated      Transfers Overall transfer level: Needs assistance Equipment used: None Transfers: Sit to/from Stand Sit to Stand: Supervision              Balance Overall balance assessment: Needs assistance Sitting-balance support: No upper extremity supported;Feet supported Sitting balance-Leahy Scale: Good Sitting balance - Comments: Pt able to perform bathing tasks sitting in chair with no LOB   Standing balance support: No upper extremity supported;During functional activity Standing balance-Leahy Scale: Fair Standing balance comment: Standing at sink performing grooming tasks, no LOB,  occasionally holds onto sink                           ADL either performed or assessed with clinical judgement   ADL Overall ADL's : At baseline;Modified independent                                       General ADL Comments: increased time     Vision Baseline Vision/History: No visual deficits Patient Visual Report: No change from baseline Vision Assessment?: No apparent visual deficits     Perception     Praxis      Pertinent Vitals/Pain Pain Assessment: No/denies pain Pain Score: 0-No pain     Hand Dominance Right   Extremity/Trunk Assessment Upper Extremity Assessment Upper Extremity Assessment: Overall WFL for tasks assessed   Lower Extremity Assessment Lower Extremity Assessment: Defer to PT evaluation   Cervical / Trunk Assessment Cervical / Trunk Assessment: Normal   Communication Communication Communication: No difficulties   Cognition Arousal/Alertness: Awake/alert Behavior During Therapy: WFL for tasks assessed/performed Overall Cognitive Status: Within Functional Limits for tasks assessed                                     General Comments       Exercises     Shoulder Instructions      Home Living Family/patient expects to be discharged to:: Private residence Living Arrangements: Children Available Help at Discharge: Family;Available PRN/intermittently Type of Home: Apartment Home Access:  Stairs to enter(pt lives on 3rd floor, no elevator) Technical brewer of Steps: 3 flights Entrance Stairs-Rails: None Home Layout: One level     Bathroom Shower/Tub: Occupational psychologist: Standard     Home Equipment: None          Prior Functioning/Environment Level of Independence: Independent        Comments: Reports no driving or working, son does grocery shopping        OT Problem List: Impaired balance (sitting and/or standing)      OT Treatment/Interventions:       OT Goals(Current goals can be found in the care plan section) Acute Rehab OT Goals Patient Stated Goal: Go home  OT Frequency:     Barriers to D/C:            Co-evaluation              AM-PAC PT "6 Clicks" Daily Activity     Outcome Measure Help from another person eating meals?: None Help from another person taking care of personal grooming?: None Help from another person toileting, which includes using toliet, bedpan, or urinal?: A Little Help from another person bathing (including washing, rinsing, drying)?: A Little Help from another person to put on and taking off regular upper body clothing?: None Help from another person to put on and taking off regular lower body clothing?: None 6 Click Score: 22   End of Session    Activity Tolerance: Patient tolerated treatment well Patient left: in chair;with bed alarm set;with call bell/phone within reach  OT Visit Diagnosis: Unsteadiness on feet (R26.81)                Time: 2423-5361 OT Time Calculation (min): 29 min Charges:  OT General Charges $OT Visit: 1 Visit OT Evaluation $OT Eval Low Complexity: 1 Low OT Treatments $Self Care/Home Management : 8-22 mins G-Codes: OT G-codes **NOT FOR INPATIENT CLASS** Functional Assessment Tool Used: AM-PAC 6 Clicks Daily Activity    Curtis Sites, OTR/L 03/21/2018, 11:00 AM

## 2018-03-21 NOTE — Progress Notes (Signed)
[facility] Progress Note  Subjective: still concerned about her SOB and low anterior chest pains, no new c/o  Vitals:   03/20/18 1814 03/20/18 2011 03/21/18 0414 03/21/18 0748  BP: (!) 158/85 (!) 144/79 (!) 159/78 (!) 156/83  Pulse: 71 78 75 80  Resp:      Temp: 98.3 F (36.8 C) 99 F (37.2 C) 98.8 F (37.1 C) 98.9 F (37.2 C)  TempSrc: Oral Oral Oral Oral  SpO2: 90% 93% 92% 94%  Weight:   69.9 kg (154 lb)   Height:        Inpatient medications: . Chlorhexidine Gluconate Cloth  6 each Topical Q0600  . Chlorhexidine Gluconate Cloth  6 each Topical Q0600  . [START ON 03/22/2018] cyclophosphamide  50 mg Oral QHS  . Darbepoetin Alfa  40 mcg Intravenous Q Thu-HD  . famotidine  20 mg Oral Daily  . folic acid  1 mg Oral Daily  . heparin injection (subcutaneous)  5,000 Units Subcutaneous Q8H  . insulin aspart  0-9 Units Subcutaneous Q4H  . insulin glargine  5 Units Subcutaneous Daily  . multivitamin with minerals  1 tablet Oral Daily  . polyethylene glycol  17 g Oral BID  . predniSONE  40 mg Oral Q breakfast  . sulfamethoxazole-trimethoprim  1 tablet Oral Once per day on Mon Wed Fri  . thiamine  100 mg Oral Daily   Or  . thiamine  100 mg Intravenous Daily   . cefTAZidime (FORTAZ)  IV 2 g (03/20/18 1830)  . 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  After high dose steroids the CXR had cleared up (see 6/25 film)  but now has recurrent bilat diffuse fine infiltrates which looks her initial pattern. Doubt this is pulm edema.  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  Need pulm input on lung disease, have asked CCM to consult.   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 Not sure if cocaine abuse is connected as there is a condition called levamisole vasculitis due to a contaminant used w/ cocaine products.  Low likelihood of renal recovery, but will continue po cytoxan and po pred for now for renal condition.  Have reviewed proper dosing w pharm, cytoxan needs to be given after dialysis so we have changed to qhs.  3  Volume - no sig vol overload, BP's up a little, lower dry if tolerated 4  Cocaine/ etoh abuse - appears to have stopped 5  Anemia - ckd and/or chron disease, started esa here yest w/ darbe 40/wk on 7/11. Will check tsat.  6  Thrombocytopenia - not sure etiology, mild 7  DM2- steroid induced recent onset, per primary  Plan - HD tomorrow   Kelly Splinter MD [facility] pager [phone]   03/21/2018, 11:07 AM   Recent Labs  Lab 03/15/18 0800 03/19/18 1820  03/20/18 0534 03/21/18 0329  NA 134* 142   < > 142 139  K 4.0 3.8   < > 3.8 4.4  CL 99 104   < > 102 99  CO2 26 26  --  25 28  GLUCOSE 148* 212*   < > 106* 97  BUN 50* 38*   < > 51* 26*  CREATININE 5.16* 4.92*   < > 6.41* 3.65*  CALCIUM 7.9* 7.7*  --  7.7* 7.8*  PHOS 3.5  --   --   --   --   ALBUMIN 2.9* 2.9*  --   --  2.8*   < > = values in this interval not displayed.   Recent Labs  Lab 03/19/18 1820 03/21/18 0329  AST 20 21  ALT 20 16  ALKPHOS 54 48  BILITOT 1.0 1.0  PROT 5.1* 5.1*   Recent Labs  Lab 03/19/18 1820  03/20/18 0534 03/21/18 0329  WBC 6.1  --  7.4 7.8  NEUTROABS 5.5  --   --   --   HGB 7.9*   < > 7.3* 7.7*  HCT 24.2*   < > 23.1* 24.0*  MCV 82.6  --  84.0 84.2  PLT 129*  --  135*  112*   < > = values in this interval not displayed.   Iron/TIBC/Ferritin/ %Sat    Component Value Date/Time   IRON 82 03/02/2018 0355   TIBC 99 (L) 03/02/2018 0355   FERRITIN 320 (H) 03/02/2018 0355   IRONPCTSAT 82 (H) 03/02/2018 0355

## 2018-03-21 NOTE — Progress Notes (Signed)
Inpatient Diabetes Program Recommendations  AACE/ADA: New Consensus Statement on Inpatient Glycemic Control (2015)  Target Ranges:  Prepandial:   less than 140 mg/dL      Peak postprandial:   less than 180 mg/dL (1-2 hours)      Critically ill patients:  140 - 180 mg/dL   Results for Rhonda Lynch, Rhonda Lynch (MRN [id number]) as of 03/21/2018 13:46  Ref. Range 03/21/2018 04:17 03/21/2018 07:48 03/21/2018 11:57  Glucose-Capillary Latest Ref Range: 70 - 99 mg/dL 91 138 (H) 219 (H)   Review of Glycemic Control  Diabetes history: DM 2 Outpatient Diabetes medications: Lantus 5 units qhs Current orders for Inpatient glycemic control: Lantus 5 units Daily, Novolog 0-9 units Q4 hours   Inpatient Diabetes Program Recommendations:    Noted steroid dose doubling this evening to BID dosing. Glucose trends increases after meals. Consider adding Novolog 2 units tid meal coverage in addition to correction scale if patient consumes at least 50% of meals and glucose is at least 80 mg/dl. Will follow trends.  Thanks,  Tama Headings RN, MSN, BC-ADM, [facility] Inpatient Diabetes Coordinator Team Pager [phone] (8a-5p)

## 2018-03-21 NOTE — Consult Note (Addendum)
Name: Rhonda Lynch MRN: [id number] DOB: 1956-04-29    ADMISSION DATE:  03/19/2018 CONSULTATION DATE:    Loistine Chance MD : Jonnie Finner  CHIEF COMPLAINT:  Dyspnea / Chest pain/recurrent bilat diffuse fine infiltrates   BRIEF PATIENT DESCRIPTION:  Obese female supine in bed wearing nasal oxygen at 2 L [place] , saturation is 94%. She is eating lunch  SIGNIFICANT EVENTS  6/5 admit with abd pain and hypoxia, AKI found, strep pneumonia +.  6/10 renal biopsy>>MPO+ RPGN 03/15/2018>> Discharge from [facility] 03/19/2018>> Readmission  STUDIES:  CXR 7/10 > Diffuse interstitial edema is again noted without significant change. Small bilateral pleural effusions.  V/Q scan 7/11 low prob for PE  CT abdomen  7/10 > Bibasilar ground-glass opacities with interlobular and intralobular septal thickening sparing the subpleural lung. Findings can be seen in cryptogenic organizing pneumonia. Superimposed pulmonary edema would be difficult to entirely exclude given presence of new bilateral small pleural effusions, right greater than left  HRCT Chest 02/17/2018 The appearance of the lungs is nonspecific, but there are imaging findings that are suggestive of cryptogenic organizing pneumonia (COP). Other differential considerations include both cardiogenic and noncardiogenic edema, however, this is not strongly favored.  HISTORY OF PRESENT ILLNESS:   The patient is a 62 y.o. year-old presenting w/ abd pain, nausea and vomiting, subjective fevers and chills as well x 2 days.  She was recently in hospital for severe PNA x 1 month and had renal failure and started on dialysis.  Was dc'd 6 days ago and went to OP HD once 2 days ago.   Prior recent admit from June 5 - July 6th >> pt w/ hx cocaine/ etoh abuse admitted for abd pain/ n/v, had strep pneumoniae PNA and AKI.  Renal biopsy showed MPO+ RPGN.  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 She was hep C +.  She was treated w/  plasampheresis, dialysis, IV steroids and dc'd on po pred and po cytoxan for renal failure. CXR showed improvement. AV graft was placed on 6/26 f/b hematoma evacuation.After high dose steroids the CXR had cleared up (see 6/25 film) but now has recurrent bilat diffuse fine infiltrates which looks her initial pattern. She was discharged on  PO prednisone and cyclosporin.  She was confused when I asked her if she was taking her medications at home, specifically prednisone and cyclosporin. I did a pill count on the 10 mg prednisone >> 68 pills  were dispensed, 64 remained in bottle at my count.. I suspect she took 1 dose of 40 mg  in the 4 days between discharge and admission, or perhaps was confused and took 1 10 mg tab for 4 days.. Cyclosporin had not been taken due to the fact it was mail ordered. PCCM have been asked to consult and evaluate her again for her dyspnea and diffuse bilateral pulmonary infiltrates   PAST MEDICAL HISTORY :   has no past medical history on file.  has a past surgical history that includes IR Fluoro Guide CV Line Right (02/18/2018); IR US Guide Vasc Access Right (02/18/2018); IR Fluoro Guide CV Line Right (02/26/2018); AV fistula placement (Left, 03/05/2018); and Hematoma evacuation (Left, 03/06/2018). Prior to Admission medications   Medication Sig Start Date End Date Taking? Authorizing Provider  acetaminophen (TYLENOL) 325 MG tablet Take 325-650 mg by mouth every 6 (six) hours as needed (for pain or headaches).   Yes [provider]  Ceftazidime (FORTAZ) 2 g SOLR injection Inject 2  g into the vein Every Tuesday,Thursday,and Saturday with dialysis. 03/15/18  Yes Elgergawy, Silver Huguenin, MD  cyclophosphamide (CYTOXAN) 50 MG capsule Take 1 capsule (50 mg total) by mouth daily. Give on an empty stomach 1 hour before or 2 hours after meals. 03/16/18  Yes Elgergawy, Silver Huguenin, MD  famotidine (PEPCID) 20 MG tablet Take 1 tablet (20 mg total) by mouth daily. 03/16/18  Yes Elgergawy, Silver Huguenin, MD  Insulin Glargine (LANTUS) 100 UNIT/ML Solostar Pen Inject 5 Units into the skin daily. Patient taking differently: Inject 5 Units into the skin at bedtime.  03/15/18  Yes Elgergawy, Silver Huguenin, MD  Multiple Vitamin (MULTIVITAMIN WITH MINERALS) TABS tablet Take 1 tablet by mouth daily. 03/16/18  Yes Elgergawy, Silver Huguenin, MD  predniSONE (DELTASONE) 20 MG tablet Take 2 tablets (40 mg total) by mouth daily with breakfast. 03/16/18  Yes Elgergawy, Silver Huguenin, MD  sulfamethoxazole-trimethoprim (BACTRIM DS,SEPTRA DS) 800-160 MG tablet Take 1 tablet by mouth 3 (three) times a week. Patient taking differently: Take 1 tablet by mouth every Monday, Wednesday, and Friday.  03/17/18  Yes Elgergawy, Silver Huguenin, MD  thiamine 100 MG tablet Take 1 tablet (100 mg total) by mouth daily. 03/16/18  Yes Elgergawy, Silver Huguenin, MD  vancomycin 750 mg in sodium chloride 0.9 % 150 mL Inject 750 mg into the vein Every Tuesday,Thursday,and Saturday with dialysis. 03/15/18  Yes Elgergawy, Silver Huguenin, MD  Darbepoetin Alfa (ARANESP) 40 MCG/0.4ML SOSY injection Inject 0.4 mLs (40 mcg total) into the vein every Thursday with hemodialysis. 03/20/18   Elgergawy, Silver Huguenin, MD   No Known Allergies  FAMILY HISTORY:  family history is not on file. SOCIAL HISTORY:  reports that she has been smoking.  She has been smoking about 1.00 pack per day. She has never used smokeless tobacco. She reports that she drinks alcohol. She reports that she has current or past drug history. Drug: Cocaine.  REVIEW OF SYSTEMS:   Constitutional: Negative for fever, chills, weight loss, +malaise/fatigue and diaphoresis.  HENT: Negative for hearing loss, ear pain, nosebleeds, congestion, sore throat, neck pain, tinnitus and ear discharge.   Eyes: Negative for blurred vision, double vision, photophobia, pain, discharge and redness.  Respiratory: Negative for cough, hemoptysis, sputum production, + but improving shortness of breath, wheezing and stridor.   Cardiovascular:  currently Negative for chest pain, palpitations, orthopnea, claudication, leg swelling and PND.  Gastrointestinal: Negative for heartburn, nausea, vomiting, abdominal pain, diarrhea, constipation, blood in stool and melena.  Genitourinary: Negative for dysuria, urgency, frequency, hematuria and flank pain.  Musculoskeletal: Negative for myalgias, back pain, joint pain and falls.  Skin: Negative for itching and rash.  Neurological: Negative for dizziness, tingling, tremors, sensory change, speech change, focal weakness, seizures, loss of consciousness, weakness and headaches.  Endo/Heme/Allergies: Negative for environmental allergies and polydipsia. Does not bruise/bleed easily.  SUBJECTIVE:  States she is feeling better. Only short of breath at night when she " hangs on to fluid". Speaking in full sentences, NAD HD 7/11  VITAL SIGNS: Temp:  [98.3 F (36.8 C)-99 F (37.2 C)] 98.9 F (37.2 C) (07/12 0748) Pulse Rate:  [65-90] 80 (07/12 0748) Resp:  [15-22] 18 (07/11 1755) BP: (144-166)/(78-100) 156/83 (07/12 0748) SpO2:  [90 %-98 %] 94 % (07/12 0748) Weight:  [151 lb 14.4 oz (68.9 kg)-156 lb 8.4 oz (71 kg)] 154 lb (69.9 kg) (07/12 0414)  PHYSICAL EXAMINATION: General: Awake and alert, on nasal O2, in NAD Neuro:Awake and alert, Follows commands, Cranial nerves intact, easily confused  HEENT: NCAT, No LAD Cardiovascular:  S1, S2, NO RMG, NSR per tele, mild Right ankle edema Lungs: Clear per L, decreased BS per Right , no wheezing noted  Abdomen:  Obese, ND,  Slightly tender to palpation, BS + Musculoskeletal:  No obvious deformities noted Skin:  Warm , dry and intact, no lesions or rash noted  Recent Labs  Lab 03/19/18 1820 03/19/18 1834 03/20/18 0534 03/21/18 0329  NA 142 140 142 139  K 3.8 3.7 3.8 4.4  CL 104 102 102 99  CO2 26  --  25 28  BUN 38* 37* 51* 26*  CREATININE 4.92* 5.10* 6.41* 3.65*  GLUCOSE 212* 212* 106* 97   Recent Labs  Lab 03/19/18 1820 03/19/18 1834  03/20/18 0534 03/21/18 0329  HGB 7.9* 8.2* 7.3* 7.7*  HCT 24.2* 24.0* 23.1* 24.0*  WBC 6.1  --  7.4 7.8  PLT 129*  --  135* 112*   Ct Abdomen Pelvis Wo Contrast  Result Date: 03/19/2018 CLINICAL DATA:  62 year old female with reported kidney failure, recently discharged for pneumonia presents with abdominal pain. EXAM: CT ABDOMEN AND PELVIS WITHOUT CONTRAST TECHNIQUE: Multidetector CT imaging of the abdomen and pelvis was performed following the standard protocol without IV contrast. COMPARISON:  02/12/2018 FINDINGS: Lower chest: Cardiomegaly with small bilateral right greater than left pleural effusions. Redemonstration of bilateral bibasilar ground-glass opacities with inter and intra lobular septal thickening in a crazy paving pattern that can be seen with cryptogenic organizing pneumonia. Superimposed stigmata of CHF given presence of bilateral pleural effusions is not excluded. Hepatobiliary: The unenhanced liver is normal given limitations of a noncontrast study. The patient is status post cholecystectomy. Pancreas: Normal Spleen: Normal Adrenals/Urinary Tract: Mild bilateral perinephric fat stranding without obstructive uropathy. The urinary bladder is unremarkable for degree of distention. Stomach/Bowel: The stomach is decompressed. Normal small bowel rotation without obstruction or inflammation. The distal and terminal ileum and appendix are normal. Unremarkable appearance of the colon with average amount of fecal retention noted. Vascular/Lymphatic: Moderate aorto iliac atherosclerosis without aneurysm. No adenopathy. Reproductive: Hysterectomy. Small fat containing dermoid in the left ovary measuring 15 mm, stable in appearance. Other: No free air nor free fluid. Small fat containing umbilical hernia. Musculoskeletal: No acute or significant osseous findings. Minimal anterolisthesis grade 1 of L4 on L5 due to facet arthropathy. IMPRESSION: 1. Bibasilar ground-glass opacities with interlobular  and intralobular septal thickening sparing the subpleural lung. Findings can be seen in cryptogenic organizing pneumonia. Superimposed pulmonary edema would be difficult to entirely exclude given presence of new bilateral small pleural effusions, right greater than left. 2. 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 3. Minimal anterolisthesis grade 1 of L4 on L5 secondary to degenerative facet arthropathy. Electronically Signed   By: Ashley Royalty M.D.   On: 03/19/2018 20:29   Dg Chest 2 View  Result Date: 03/19/2018 CLINICAL DATA:  Abdominal pain.  Recent dialysis shunt placement. EXAM: CHEST - 2 VIEW COMPARISON:  03/14/2018 and 03/06/2018 FINDINGS: Cardiomegaly with mild aortic atherosclerosis. Diffuse interstitial edema persists with small bilateral pleural effusions. Right IJ dialysis catheter terminates at the cavoatrial junction and is unchanged in appearance. There is pneumothorax. No acute osseous abnormality. IMPRESSION: Stable cardiomegaly with mild aortic atherosclerosis. Diffuse interstitial edema is again noted without significant change. Small bilateral pleural effusions. Electronically Signed   By: Ashley Royalty M.D.   On: 03/19/2018 17:03   Nm Pulmonary Vent And Perf (v/q Scan)  Result Date: 03/20/2018 CLINICAL DATA:  Shortness of Breath EXAM: NUCLEAR  MEDICINE VENTILATION - PERFUSION LUNG SCAN VIEWS: Anterior, posterior, left lateral, right lateral, RPO, LPO, RAO, LAO-ventilation and perfusion RADIOPHARMACEUTICALS:  31.6 mCi of Tc-71m DTPA aerosol inhalation and 4.2 mCi Tc74m-MAA IV COMPARISON:  Chest radiograph March 19, 2018 FINDINGS: Ventilation: Radiotracer uptake is homogeneous and symmetric bilaterally. No ventilation defects are evident. Perfusion: Radiotracer uptake is homogeneous and symmetric bilaterally. No appreciable perfusion defects. IMPRESSION: No appreciable ventilation or perfusion defects. This study constitutes a very low probability of pulmonary embolus. Electronically  Signed   By: Lowella Grip III M.D.   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 Cleared and has now recurred ? Compliance with prednisone ( Pill count showed only 1 dose taken)  Had not started Cyclosporin 2/2 being mail ordered ( Not available) Current every day smoker  Plan: -Increase prednisone to 40 mg BID x 4 days, then resume 40 mg daily if clinically appropriate and  Improving - Cyclosporin per primary team -Trend CXR prn -Wean oxygen for sats of 92% -Will need ambulatory saturation prior to discharge -Ensure patient has all medications prior to discharge home -Aggressive Pulmonary Toilet -IS as able -Mobilize as able -Tressie Ellis, Bactrim and Vanc per primary team -Consider repeat HRCT to confirm improvement after continuous treatment -Smoking cessation counseling   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  Patient was discharged and I believe there was a mix up in the pattern or dosing of her steroid use at home and returns to the hospital with increased pulmonary infiltrate on a CXR that I reviewed myself.  On exam, diffuse rhonchi noted.  Discussed with PCCM-NP.  BOOP:  - Increase prednisone to 40 mg PO BID for 4 days then decrease back to 40 daily  - Taper  should be done over a long period of time, 6 months  Hypoxemia:  - Titrate O2 for sat of 88-92%  - Once infiltrate begin to clear will need an ambulatory desaturation study prior to discharge to evaluate for home O2 need.  HCAP:  - Fortaz, bactrim and vanc per primary.  - F/U on cultures.  PCCM will follow with you.  Patient seen and examined, agree with above note.  I dictated the care and orders written for this patient under my direction.  Rush Farmer, [place]

## 2018-03-22 DIAGNOSIS — J9601 Acute respiratory failure with hypoxia: Secondary | ICD-10-CM

## 2018-03-22 DIAGNOSIS — N186 End stage renal disease: Secondary | ICD-10-CM

## 2018-03-22 DIAGNOSIS — F1410 Cocaine abuse, uncomplicated: Secondary | ICD-10-CM

## 2018-03-22 LAB — RENAL FUNCTION PANEL
Albumin: 2.8 g/dL — ABNORMAL LOW (ref 3.5–5.0)
Anion gap: 13 (ref 5–15)
BUN: 59 mg/dL — AB (ref 8–23)
CALCIUM: 8.3 mg/dL — AB (ref 8.9–10.3)
CHLORIDE: 97 mmol/L — AB (ref 98–111)
CO2: 25 mmol/L (ref 22–32)
Creatinine, Ser: 6.35 mg/dL — ABNORMAL HIGH (ref 0.44–1.00)
GFR calc Af Amer: 7 mL/min — ABNORMAL LOW (ref >60)
GFR calc non Af Amer: 6 mL/min — ABNORMAL LOW (ref >60)
GLUCOSE: 161 mg/dL — AB (ref 70–99)
PHOSPHORUS: 3.3 mg/dL (ref 2.5–4.6)
POTASSIUM: 4.7 mmol/L (ref 3.5–5.1)
Sodium: 135 mmol/L (ref 135–145)

## 2018-03-22 LAB — CBC
HCT: 23.6 % — ABNORMAL LOW (ref 36.0–46.0)
Hemoglobin: 7.5 g/dL — ABNORMAL LOW (ref 12.0–15.0)
MCH: 26.2 pg (ref 26.0–34.0)
MCHC: 31.8 g/dL (ref 30.0–36.0)
MCV: 82.5 fL (ref 78.0–100.0)
Platelets: 110 10*3/uL — ABNORMAL LOW (ref 150–400)
RBC: 2.86 MIL/uL — ABNORMAL LOW (ref 3.87–5.11)
RDW: 18.7 % — AB (ref 11.5–15.5)
WBC: 8 10*3/uL (ref 4.0–10.5)

## 2018-03-22 LAB — IRON AND TIBC
Iron: 114 ug/dL (ref 28–170)
SATURATION RATIOS: 51 % — AB (ref 10.4–31.8)
TIBC: 225 ug/dL — AB (ref 250–450)
UIBC: 111 ug/dL

## 2018-03-22 LAB — GLUCOSE, CAPILLARY
GLUCOSE-CAPILLARY: 216 mg/dL — AB (ref 70–99)
GLUCOSE-CAPILLARY: 161 mg/dL — AB (ref 70–99)
GLUCOSE-CAPILLARY: 269 mg/dL — AB (ref 70–99)
Glucose-Capillary: 204 mg/dL — ABNORMAL HIGH (ref 70–99)
Glucose-Capillary: 147 mg/dL — ABNORMAL HIGH (ref 70–99)
Glucose-Capillary: 301 mg/dL — ABNORMAL HIGH (ref 70–99)

## 2018-03-22 LAB — FERRITIN: Ferritin: 958 ng/mL — ABNORMAL HIGH (ref 11–307)

## 2018-03-22 MED ORDER — PENTAFLUOROPROP-TETRAFLUOROETH EX AERO: 1.0000 "application " | INHALATION_SPRAY | CUTANEOUS | Status: DC | PRN

## 2018-03-22 MED ORDER — SODIUM CHLORIDE 0.9 % IV SOLN: 100.0000 mL | INTRAVENOUS | Status: DC | PRN

## 2018-03-22 MED ORDER — LIDOCAINE-PRILOCAINE 2.5-2.5 % EX CREA: 1.0000 "application " | TOPICAL_CREAM | CUTANEOUS | Status: DC | PRN

## 2018-03-22 MED ORDER — DOCUSATE SODIUM 100 MG PO CAPS
100.0000 mg | ORAL_CAPSULE | Freq: Once | ORAL | Status: AC
  Administered 2018-03-22: 100 mg via ORAL
  Filled 2018-03-22: qty 1

## 2018-03-22 MED ORDER — HEPARIN SODIUM (PORCINE) 1000 UNIT/ML DIALYSIS: 2000.0000 [IU] | Freq: Once | INTRAMUSCULAR | Status: DC

## 2018-03-22 MED ORDER — HYDROMORPHONE HCL 2 MG PO TABS
2.0000 mg | ORAL_TABLET | ORAL | Status: DC | PRN
  Administered 2018-03-22 – 2018-03-23 (×4): 2 mg via ORAL
  Filled 2018-03-22 (×4): qty 1

## 2018-03-22 MED ORDER — TRAZODONE HCL 50 MG PO TABS
50.0000 mg | ORAL_TABLET | Freq: Once | ORAL | Status: AC
  Administered 2018-03-22: 50 mg via ORAL
  Filled 2018-03-22: qty 1

## 2018-03-22 MED ORDER — ZOLPIDEM TARTRATE 5 MG PO TABS
2.5000 mg | ORAL_TABLET | Freq: Every evening | ORAL | Status: DC | PRN
  Administered 2018-03-22 – 2018-03-23 (×2): 2.5 mg via ORAL
  Filled 2018-03-22 (×2): qty 1

## 2018-03-22 NOTE — Progress Notes (Signed)
SATURATION QUALIFICATIONS: (This note is used to comply with regulatory documentation for home oxygen)  Patient Saturations on Room Air at Rest = 85%  Patient Saturations on 2 Liters of oxygen while Ambulating = 88%  Patient Saturations on 3 Liters of oxygen while Ambulating = 93-97%  Please briefly explain why patient needs home oxygen: Pt desats on RA at rest and with activity and will need home O2.  Elayne Snare, [place], DPT Acute Rehabilitation [phone]

## 2018-03-22 NOTE — Progress Notes (Signed)
PROGRESS NOTE  Rhonda Lynch  UUV:[id number] DOB: 07-02-1956 DOA: 03/19/2018 PCP: Patient, No Pcp Per   Brief Narrative: Per HPI: 62 y.o.femalewith medical history significant ofCocaine and EtOH abuse. Recent admit to our service for a month long hospital stay this past month, discharged 4 days ago.62 year old female with past medical history of cocaine/alcohol abuse who was admitted on 02/12/2018 with abdominal pain, nausea, vomiting. She was found to have Streptococcus pneumonia and also had acute kidney injury on presentation. Nephrology was consulted . 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 She has progressed to end-stage renal disease and is currently undergoing dialysis, plasmapheresis and has been started, on steroids and cyclophosphamide. Discharge planning after setting of outpatient dialysis facility. Received AV graft on 6/26/2019with hematoma which was subsequently evacuated Readmitted 7/10 with shortness of breath and has continued to be newly hypoxic.  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 VQ scan low probability.  - Pulmonology consulted, treating BOOP as below.  - Continue supplemental oxygen as needed  BOOP: Previously considered diffuse alveolar hemorrhage vs. BOOP. No current hemoptysis. Bibasilar GGOs on CT abd/pelvis seen at bases and diffuse interstitial edema on CXR here worse than last study.  - Plan 6 month taper of prednisone, currently increased to 40mg  po  BID per pulm.  - ?would macrolide be of benefit - Monitor clinically. Repeat CXR 7/14. Once improving will repeat ambulatory pulse oximetry.   MPO+, ANCA+ RPGN and fibrillary glomerulonephritis: Dx by biopsy. s/p plasmapheresis at previous admission. Discharged on steroids and cytoxan daily but has not been taking prednisone appropriately for unclear reasons and has not received mail-order cytoxan.  - Will continue cytoxan at 50mg  dose qHS (needs to be given after HD per nephrology/pharmacy) - On bactrim ppx with immunosuppression.   ESRD: Due to RPGN Dx on biopsy, s/p plasmapheresis x 7 days.   - Nephrology following, routine HD (recently CLIPPED for TTS) thru cath while AVF matures (placed 6/26 by Dr. Donnetta Hutching (required hematoma evacuation 6/27).   Fever: During previous admission, was placed on empiric broad specturm abx while cultures pending.  - Cultures negative and currently afebrile so antibiotics were discontinued.  Abdominal pain, nausea, vomiting: Improved. CT abd/pelvis reassuring. Lipase wnl. Has had cholecystectomy. - Continue pepcid BID given ongoing steroids.  - Miralax prn.   Troponin elevation: Mild in setting of ESRD and hypoxia, no ischemic ECG changes at admission; not consistent with ACS. Initially on heparin, this has been stopped. VQ scan low probability for PE.  - No further interventions planned.   T2DM: HbA1c 6.8%. Anticipate worsening with steroids.  - Continue lantus, SSI. Will watch CBGs today and titrate as needed.  History of cocaine use: UDS negative at this admission (positive last admission) - Cessation counseling reviewed.  Anemia of chronic kidney disease and acute blood loss anemia: Transfused 2 units during her most recent admission - Darbo weekly per nephrology.  - Monitor CBC, anemia panel  Chronic hepatitis C:  - ID outpatient follow up  Thrombocytopenia:  - Monitor, still well above 50k so can continue DVT ppx.  Insomnia: Having trouble  sleeping while inpatient.  - Low dose ambien  DVT prophylaxis: Heparin Code Status: Full Family Communication: None at bedside, declined offer to call Disposition Plan: [place] home with home health with rollator once improving. May also need home oxygen.    Consultants:   Nephrology  Pulmonology  Procedures:   HD 7/11, 7/13  Antimicrobials:  Vancomycin, ceftazidime 7/11   Subjective: Still short of breath worse than previous baseline. Tolerated HD. No fevers. Lower chest discomfort persists. Couldn't sleep last night despite trying trazodone, says was getting IV dilaudid and po ambien for sleep at last admission.   Objective: Vitals:   03/22/18 1100 03/22/18 1130 03/22/18 1145 03/22/18 1218  BP: 137/69 117/79 (!) 144/87 (!) 88/68  Pulse: 60 (!) 54 74 70  Resp:   12   Temp:   97.7 F (36.5 C) 98.1 F (36.7 C)  TempSrc:   Oral Oral  SpO2:   98% 96%  Weight:   67 kg (147 lb 11.3 oz)   Height:        Intake/Output Summary (Last 24 hours) at 03/22/2018 1323 Last data filed at 03/22/2018 1145 Gross per 24 hour  Intake 358 ml  Output 3200 ml  Net -2842 ml   Filed Weights   03/22/18 0553 03/22/18 0740 03/22/18 1145  Weight: 69.8 kg (153 lb 12.8 oz) 70.3 kg (154 lb 15.7 oz) 67 kg (147 lb 11.3 oz)   Gen: Chronically ill-appearing 62 y.o. female in no distress Pulm: Nonlabored with supplemental oxygen, decreased diffusely with improvement in rhonchi. No wheezing or crackles. CV: Regular rate and rhythm. No murmur, rub, or gallop. No JVD, no dependent edema. GI: Abdomen soft, non-tender, non-distended, with normoactive bowel sounds.  Ext: Warm, no deformities. LUE AVF + thrill, no erythema. HD cath in right chest c/d/i no tenderness or erythema or discharge. Skin: No rashes, lesions or ulcers on visualized skin.  Neuro: Alert and oriented. No focal neurological deficits. Psych: Judgement and insight appear fair. Mood euthymic & affect congruent. Behavior is  appropriate.    Data Reviewed: I have personally reviewed following labs and imaging studies  CBC: Recent Labs  Lab 03/19/18 1820 03/19/18 1834 03/20/18 0534 03/21/18 0329 03/22/18 0453  WBC 6.1  --  7.4 7.8 8.0  NEUTROABS 5.5  --   --   --   --   HGB 7.9* 8.2* 7.3* 7.7* 7.5*  HCT 24.2* 24.0* 23.1* 24.0* 23.6*  MCV 82.6  --  84.0 84.2 82.5  PLT 129*  --  135* 112* 967*   Basic Metabolic Panel: Recent Labs  Lab 03/19/18 1820 03/19/18 1834 03/20/18 0534 03/21/18 0329 03/22/18 0453  NA 142 140 142 139 135  K 3.8 3.7 3.8 4.4 4.7  CL 104 102 102 99 97*  CO2 26  --  25 28 25   GLUCOSE 212* 212* 106* 97 161*  BUN 38* 37* 51* 26* 59*  CREATININE 4.92* 5.10* 6.41* 3.65* 6.35*  CALCIUM 7.7*  --  7.7* 7.8* 8.3*  PHOS  --   --   --   --  3.3   GFR: Estimated Creatinine Clearance: 7.8 mL/min (A) (by C-G formula based on SCr of 6.35 mg/dL (H)). Liver Function Tests: Recent Labs  Lab 03/19/18 1820 03/21/18 0329 03/22/18 0453  AST 20 21  --   ALT 20 16  --   ALKPHOS 54 48  --   BILITOT 1.0 1.0  --   PROT 5.1* 5.1*  --   ALBUMIN 2.9* 2.8*  2.8*   Recent Labs  Lab 03/19/18 1820  LIPASE 24   No results for input(s): AMMONIA in the last 168 hours. Coagulation Profile: No results for input(s): INR, PROTIME in the last 168 hours. Cardiac Enzymes: Recent Labs  Lab 03/19/18 2140 03/20/18 0534 03/20/18 1011  TROPONINI 0.13* 0.11* 0.09*   BNP (last 3 results) No results for input(s): PROBNP in the last 8760 hours. HbA1C: No results for input(s): HGBA1C in the last 72 hours. CBG: Recent Labs  Lab 03/21/18 2041 03/22/18 0151 03/22/18 0515 03/22/18 0718 03/22/18 1216  GLUCAP 249* 216* 161* 204* 147*   Lipid Profile: No results for input(s): CHOL, HDL, LDLCALC, TRIG, CHOLHDL, LDLDIRECT in the last 72 hours. Thyroid Function Tests: No results for input(s): TSH, T4TOTAL, FREET4, T3FREE, THYROIDAB in the last 72 hours. Anemia Panel: Recent Labs    03/22/18 0750    FERRITIN 958*  TIBC 225*  IRON 114   Urine analysis:    Component Value Date/Time   COLORURINE YELLOW 03/19/2018 1738   APPEARANCEUR HAZY (A) 03/19/2018 1738   LABSPEC 1.014 03/19/2018 1738   PHURINE 6.0 03/19/2018 1738   GLUCOSEU 50 (A) 03/19/2018 1738   HGBUR LARGE (A) 03/19/2018 1738   HGBUR trace-intact 03/08/2010 1452   BILIRUBINUR NEGATIVE 03/19/2018 1738   KETONESUR NEGATIVE 03/19/2018 1738   PROTEINUR >=300 (A) 03/19/2018 1738   UROBILINOGEN 0.2 03/08/2010 1452   NITRITE NEGATIVE 03/19/2018 1738   LEUKOCYTESUR TRACE (A) 03/19/2018 1738   Recent Results (from the past 240 hour(s))  Culture, blood (Routine X 2) w Reflex to ID Panel     Status: None   Collection Time: 03/14/18  8:11 AM  Result Value Ref Range Status   Specimen Description BLOOD RIGHT ARM  Final   Special Requests   Final    BOTTLES DRAWN AEROBIC AND ANAEROBIC Blood Culture adequate volume   Culture   Final    NO GROWTH 5 DAYS Performed at [facility], [place] [address] [place], [place] [address]    Report Status 03/19/2018 FINAL  Final  Culture, blood (Routine X 2) w Reflex to ID Panel     Status: None   Collection Time: 03/14/18  8:25 AM  Result Value Ref Range Status   Specimen Description BLOOD RIGHT HAND  Final   Special Requests   Final    BOTTLES DRAWN AEROBIC AND ANAEROBIC Blood Culture adequate volume   Culture   Final    NO GROWTH 5 DAYS Performed at [facility], [place] [address] [place], [place] [address]    Report Status 03/19/2018 FINAL  Final  C difficile quick scan w PCR reflex     Status: None   Collection Time: 03/14/18  9:24 AM  Result Value Ref Range Status   C Diff antigen NEGATIVE NEGATIVE Final   C Diff toxin NEGATIVE NEGATIVE Final   C Diff interpretation No C. difficile detected.  Final    Comment: Performed at [facility], [place] [address] [place], [place] [address]  Culture, blood (single)     Status: None   Collection Time: 03/14/18  4:39 PM   Result Value Ref Range Status   Specimen Description BLOOD CENTRAL LINE  Final   Special Requests   Final    BOTTLES DRAWN AEROBIC AND ANAEROBIC Blood Culture results may not be optimal due to an inadequate volume of blood received in culture bottles   Culture   Final    NO GROWTH 5  DAYS Performed at [facility], [place] [address] [place], [place] [address]    Report Status 03/19/2018 FINAL  Final  Culture, blood (single)     Status: None   Collection Time: 03/14/18  4:39 PM  Result Value Ref Range Status   Specimen Description BLOOD CENTRAL LINE  Final   Special Requests   Final    BOTTLES DRAWN AEROBIC AND ANAEROBIC Blood Culture adequate volume   Culture   Final    NO GROWTH 5 DAYS Performed at [place] [address], [place], [place] [address]    Report Status 03/19/2018 FINAL  Final  Urine culture     Status: None   Collection Time: 03/19/18  5:38 PM  Result Value Ref Range Status   Specimen Description URINE, CLEAN CATCH  Final   Special Requests NONE  Final   Culture   Final    NO GROWTH Performed at [facility], [place] [address], [place], [place] [address]    Report Status 03/20/2018 FINAL  Final      Radiology Studies: No results found.  Scheduled Meds: . Chlorhexidine Gluconate Cloth  6 each Topical Q0600  . Chlorhexidine Gluconate Cloth  6 each Topical Q0600  . cyclophosphamide  50 mg Oral QHS  . Darbepoetin Alfa  40 mcg Intravenous Q Thu-HD  . famotidine  20 mg Oral Daily  . folic acid  1 mg Oral Daily  . heparin injection (subcutaneous)  5,000 Units Subcutaneous Q8H  . insulin aspart  0-9 Units Subcutaneous Q4H  . insulin glargine  5 Units Subcutaneous Daily  . multivitamin with minerals  1 tablet Oral Daily  . polyethylene glycol  17 g Oral BID  . predniSONE  40 mg Oral BID  . [START ON 03/26/2018] predniSONE  40 mg Oral Q breakfast  . sulfamethoxazole-trimethoprim  1 tablet Oral Once per day on Mon Wed Fri  . thiamine  100 mg  Oral Daily   Or  . thiamine  100 mg Intravenous Daily   Continuous Infusions:   LOS: 1 day   Time spent: 25 minutes.  Patrecia Pour, MD [facility] [email or web address] Password [email or web address] 03/22/2018, 1:23 PM

## 2018-03-22 NOTE — Progress Notes (Signed)
Physical Therapy Treatment Patient Details Name: Rhonda Lynch MRN: [id number] DOB: [birth year] Today's Date: 03/22/2018    History of Present Illness The patient is a 62 y.o. year-old presenting w/ abd pain, nausea and vomiting, subjective fevers and chills as well x 2 days.  She was recently in hospital for severe PNA x 1 month and had renal failure and started on dialysis.  Was dc'd 6 days ago and went to OP HD once 2 days ago. Hx of polysubstance abuse    PT Comments    Making excellent progress. Ambulating well, tried using a rollator today which improved her endurance (see O2 qualification note entered prior to this note.) Declines practicing stair navigation but feels confident she can manage, especially with the supervision of her son who was present and supportive during treatment today. Adequate for D/c from PT standpoint when medically stable.    Follow Up Recommendations  Home health PT;Supervision - Intermittent(would benefit but she says she can't pay copay)     Equipment Recommendations  Other (comment)(rollator)    Recommendations for Other Services       Precautions / Restrictions Precautions Precautions: Fall Precaution Comments: watch O2 Restrictions Weight Bearing Restrictions: No    Mobility  Bed Mobility Overal bed mobility: Modified Independent Bed Mobility: Supine to Sit;Sit to Supine     Supine to sit: Modified independent (Device/Increase time);HOB elevated Sit to supine: Modified independent (Device/Increase time);HOB elevated   General bed mobility comments: Comes to EOB without assistance, extra time.  Transfers Overall transfer level: Needs assistance Equipment used: None Transfers: Sit to/from Stand Sit to Stand: Supervision         General transfer comment: Supervision for safety. stood several times from bed without physical assistance.  Ambulation/Gait Ambulation/Gait assistance: Supervision Gait Distance (Feet): 400  Feet Assistive device: 4-wheeled walker Gait Pattern/deviations: Step-through pattern;Decreased stride length Gait velocity: Decreased   General Gait Details: Trialed rollator today. good stability, cues for sequencing safe use. Educated on brakes, and energy conservation techniques with this device. SpO2 88% on 2L supplemental O2. Up to 93-97% while ambulating with 3L. No dyspnea or reported SOB.   Stairs         General stair comments: Declined navigating stairs today. States she is too tired but does feel like she will be able to safely ascend/descend steps at home. We discussed safety and energy conservation techniques. Son will guard patient as needed.   Wheelchair Mobility    Modified Rankin (Stroke Patients Only)       Balance Overall balance assessment: Needs assistance Sitting-balance support: No upper extremity supported;Feet supported Sitting balance-Leahy Scale: Good     Standing balance support: No upper extremity supported;During functional activity Standing balance-Leahy Scale: Fair                              Cognition Arousal/Alertness: Awake/alert Behavior During Therapy: WFL for tasks assessed/performed Overall Cognitive Status: Within Functional Limits for tasks assessed                                        Exercises      General Comments General comments (skin integrity, edema, etc.): See O2 qualification note for sats.      Pertinent Vitals/Pain Pain Assessment: No/denies pain    Home Living  Prior Function            PT Goals (current goals can now be found in the care plan section) Acute Rehab PT Goals Patient Stated Goal: Go home PT Goal Formulation: With patient Time For Goal Achievement: 04/04/18 Potential to Achieve Goals: Good Progress towards PT goals: Progressing toward goals    Frequency    Min 3X/week      PT Plan Current plan remains  appropriate;Equipment recommendations need to be updated    Co-evaluation              AM-PAC PT "6 Clicks" Daily Activity  Outcome Measure  Difficulty turning over in bed (including adjusting bedclothes, sheets and blankets)?: None Difficulty moving from lying on back to sitting on the side of the bed? : None Difficulty sitting down on and standing up from a chair with arms (e.g., wheelchair, bedside commode, etc,.)?: None Help needed moving to and from a bed to chair (including a wheelchair)?: None Help needed walking in hospital room?: None Help needed climbing 3-5 steps with a railing? : A Little 6 Click Score: 23    End of Session Equipment Utilized During Treatment: Gait belt;Oxygen Activity Tolerance: Patient tolerated treatment well Patient left: in bed;with call bell/phone within reach;with bed alarm set;with family/visitor present Nurse Communication: Mobility status PT Visit Diagnosis: Unsteadiness on feet (R26.81);Difficulty in walking, not elsewhere classified (R26.2)     Time: 2081-3887 PT Time Calculation (min) (ACUTE ONLY): 23 min  Charges:  $Gait Training: 23-37 mins                    G Codes:       [employer], PT    Ellouise Newer 03/22/2018, 3:19 PM

## 2018-03-22 NOTE — Progress Notes (Signed)
[facility] Progress Note  Subjective: on HD now, no new c/o's.     Vitals:   03/22/18 0900 03/22/18 0930 03/22/18 1000 03/22/18 1030  BP: (!) 171/86 (!) 154/86 (!) 152/87 138/77  Pulse: (!) 58 (!) 58 65 62  Resp:      Temp:      TempSrc:      SpO2:      Weight:      Height:        Inpatient medications: . Chlorhexidine Gluconate Cloth  6 each Topical Q0600  . Chlorhexidine Gluconate Cloth  6 each Topical Q0600  . cyclophosphamide  50 mg Oral QHS  . Darbepoetin Alfa  40 mcg Intravenous Q Thu-HD  . famotidine  20 mg Oral Daily  . folic acid  1 mg Oral Daily  . [START ON 03/23/2018] heparin  2,000 Units Dialysis Once in dialysis  . heparin injection (subcutaneous)  5,000 Units Subcutaneous Q8H  . insulin aspart  0-9 Units Subcutaneous Q4H  . insulin glargine  5 Units Subcutaneous Daily  . multivitamin with minerals  1 tablet Oral Daily  . polyethylene glycol  17 g Oral BID  . predniSONE  40 mg Oral BID  . [START ON 03/26/2018] predniSONE  40 mg Oral Q breakfast  . sulfamethoxazole-trimethoprim  1 tablet Oral Once per day on Mon Wed Fri  . thiamine  100 mg Oral Daily   Or  . thiamine  100 mg Intravenous Daily   . sodium chloride     sodium chloride, acetaminophen **OR** acetaminophen, HYDROcodone-acetaminophen, lidocaine-prilocaine, LORazepam **OR** LORazepam, ondansetron **OR** ondansetron (ZOFRAN) IV, pentafluoroprop-tetrafluoroeth  Exam: Nasal O2, no distress  no jvd Chest dec'd BS bilat no wheezing noted Cor reg no mrg Abd soft ntnd no ascites Ext no sig edema NF, ox 3  [facility]  4h  69.5kg   2/2.25  Heparin 2000  - mircera 50 every 2, start 7/11  - calcitriol 0.25 ug tiw      Impression: 1  SOB/ chest pain/ recur pulm infiltrates: likely recurrent BOOP, seen by pulmonary yesterday, appreciate pulm input >   - ^prednisone to 40 bid for 4 days then back to 40 mg qd  - then slow taper over approx 6 mos   - prednisone should prob be  managed by pulm team after dc 2 ESRD: due to MPO ANCA RPGN w/ underlying fibrillary GN  - pt is on maintenance po cytoxan at esrd dose of 50 mg qhs   - renal team (Dr [facility] at [facility]) will manage cytoxan dosing 3  Volume - no sig vol overload, BP's up a little, lower dry if tolerated 4  Hx Cocaine/ etoh abuse 5  Anemia - ckd and/or chron disease, started esa here yest w/ darbe 40/wk on 7/11. Will check tsat.  6  Thrombocytopenia - not sure etiology, mild 7  DM2- steroid induced recent onset, per primary  Plan - HD today, UF as Ronnie Derby MD [place] pager [phone]   03/22/2018, 11:01 AM   Recent Labs  Lab 03/21/18 0329 03/22/18 0453  NA 139 135  K 4.4 4.7  CL 99 97*  CO2 28 25  GLUCOSE 97 161*  BUN 26* 59*  CREATININE 3.65* 6.35*  CALCIUM 7.8* 8.3*  PHOS  --  3.3  ALBUMIN 2.8* 2.8*   Recent Labs  Lab 03/19/18 1820 03/21/18 0329  AST 20 21  ALT 20 16  ALKPHOS 54 48  BILITOT 1.0 1.0  PROT 5.1* 5.1*   Recent Labs  Lab 03/19/18 1820  03/21/18 0329 03/22/18 0453  WBC 6.1   < > 7.8 8.0  NEUTROABS 5.5  --   --   --   HGB 7.9*   < > 7.7* 7.5*  HCT 24.2*   < > 24.0* 23.6*  MCV 82.6   < > 84.2 82.5  PLT 129*   < > 112* 110*   < > = values in this interval not displayed.   Iron/TIBC/Ferritin/ %Sat    Component Value Date/Time   IRON 114 03/22/2018 0750   TIBC 225 (L) 03/22/2018 0750   FERRITIN 958 (H) 03/22/2018 0750   IRONPCTSAT 51 (H) 03/22/2018 0750

## 2018-03-23 ENCOUNTER — Inpatient Hospital Stay (HOSPITAL_COMMUNITY): Payer: Medicaid Other

## 2018-03-23 LAB — GLUCOSE, CAPILLARY
GLUCOSE-CAPILLARY: 147 mg/dL — AB (ref 70–99)
GLUCOSE-CAPILLARY: 196 mg/dL — AB (ref 70–99)
GLUCOSE-CAPILLARY: 286 mg/dL — AB (ref 70–99)
Glucose-Capillary: 289 mg/dL — ABNORMAL HIGH (ref 70–99)
Glucose-Capillary: 268 mg/dL — ABNORMAL HIGH (ref 70–99)
Glucose-Capillary: 316 mg/dL — ABNORMAL HIGH (ref 70–99)

## 2018-03-23 NOTE — Progress Notes (Signed)
[facility] Progress Note  Subjective: on HD now, no new c/o's.     Vitals:   03/22/18 2001 03/23/18 0636 03/23/18 0742 03/23/18 1121  BP: (!) 141/66  (!) 156/81 (!) 143/75  Pulse: 66  61 68  Resp: 18     Temp: 98.7 F (37.1 C)   99 F (37.2 C)  TempSrc: Oral   Oral  SpO2: 97%  94% 96%  Weight:  68.1 kg (150 lb 3.2 oz)    Height:        Inpatient medications: . Chlorhexidine Gluconate Cloth  6 each Topical Q0600  . Chlorhexidine Gluconate Cloth  6 each Topical Q0600  . cyclophosphamide  50 mg Oral QHS  . Darbepoetin Alfa  40 mcg Intravenous Q Thu-HD  . famotidine  20 mg Oral Daily  . folic acid  1 mg Oral Daily  . heparin injection (subcutaneous)  5,000 Units Subcutaneous Q8H  . insulin aspart  0-9 Units Subcutaneous Q4H  . insulin glargine  5 Units Subcutaneous Daily  . multivitamin with minerals  1 tablet Oral Daily  . polyethylene glycol  17 g Oral BID  . predniSONE  40 mg Oral BID  . [START ON 03/26/2018] predniSONE  40 mg Oral Q breakfast  . sulfamethoxazole-trimethoprim  1 tablet Oral Once per day on Mon Wed Fri  . thiamine  100 mg Oral Daily   Or  . thiamine  100 mg Intravenous Daily    acetaminophen **OR** acetaminophen, HYDROmorphone, ondansetron **OR** ondansetron (ZOFRAN) IV, zolpidem  Exam: Nasal O2, no distress  no jvd Chest dec'd BS bilat no wheezing noted Cor reg no mrg Abd soft ntnd no ascites Ext no sig edema NF, ox 3  [facility]  4h  69.5kg   2/2.25  Heparin 2000  - mircera 50 every 2, start 7/11  - calcitriol 0.25 ug tiw      Impression: 1  SOB/ chest pain/ recur pulm infiltrates: seen by pulm, recurrent BOOP, appreciate input >   - ^prednisone to 40 bid for 4 days then back to 40 mg qd  - then slow taper over approx 6 mos   - prednisone should be managed by pulm team after dc for this entity 2 ESRD: TTS HD. Recent esrd w MPO ANCA RPGN + underlying fibrillary GN  - pt is on maintenance po cytoxan at esrd dose of 50  mg qhs   - renal (Dr Joelyn Oms) will manage cytoxan dosing after dc 3  Volume - no sig vol overload, BP's up a little, lower dry slightly 68kg today 4  Hx Cocaine/ etoh abuse 5  Anemia ckd/ chronic dz - started esa w/ darbe 40/wk on 7/11, tsat 59%. 6  Thrombocytopenia - not sure etiology, mild 7  DM2- steroid induced recent onset, per primary  Plan - next HD tuesday   Kelly Splinter MD [place] pager [phone]   03/23/2018, 12:32 PM   Recent Labs  Lab 03/21/18 0329 03/22/18 0453  NA 139 135  K 4.4 4.7  CL 99 97*  CO2 28 25  GLUCOSE 97 161*  BUN 26* 59*  CREATININE 3.65* 6.35*  CALCIUM 7.8* 8.3*  PHOS  --  3.3  ALBUMIN 2.8* 2.8*   Recent Labs  Lab 03/19/18 1820 03/21/18 0329  AST 20 21  ALT 20 16  ALKPHOS 54 48  BILITOT 1.0 1.0  PROT 5.1* 5.1*   Recent Labs  Lab 03/19/18 1820  03/21/18 0329 03/22/18 0453  WBC  6.1   < > 7.8 8.0  NEUTROABS 5.5  --   --   --   HGB 7.9*   < > 7.7* 7.5*  HCT 24.2*   < > 24.0* 23.6*  MCV 82.6   < > 84.2 82.5  PLT 129*   < > 112* 110*   < > = values in this interval not displayed.   Iron/TIBC/Ferritin/ %Sat    Component Value Date/Time   IRON 114 03/22/2018 0750   TIBC 225 (L) 03/22/2018 0750   FERRITIN 958 (H) 03/22/2018 0750   IRONPCTSAT 51 (H) 03/22/2018 0750

## 2018-03-23 NOTE — Progress Notes (Signed)
PROGRESS NOTE  Rhonda Lynch  ZOX:[id number] DOB: [birth year] DOA: 03/19/2018 PCP: Patient, No Pcp Per   Brief Narrative: Per HPI: 62 y.o.femalewith medical history significant ofCocaine and EtOH abuse. Recent admit to our service for a month long hospital stay this past month, discharged 4 days ago.62 year old female with past medical history of cocaine/alcohol abuse who was admitted on 02/12/2018 with abdominal pain, nausea, vomiting. She was found to have Streptococcus pneumonia and also had acute kidney injury on presentation. Nephrology was consulted . 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 She has progressed to end-stage renal disease and is currently undergoing dialysis, plasmapheresis and has been started, on steroids and cyclophosphamide. Discharge planning after setting of outpatient dialysis facility. Received AV graft on 6/26/2019with hematoma which was subsequently evacuated Readmitted 7/10 with shortness of breath and has continued to be newly hypoxic. Prednisone was increased by pulmonology and cytoxan continued (pt had not been able to take this as it was delayed by mail order). She has improved but remains more dyspneic than baseline.   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 VQ scan low probability.  - Pulmonology consulted, treating BOOP as below.  - Continue supplemental oxygen as needed.  - CXR showing improvement, will attempt to wean oxygen over next 24 hours. Likely  to discharge tomorrow with or without oxygen.   BOOP: Previously considered diffuse alveolar hemorrhage vs. BOOP. No current hemoptysis. Bibasilar GGOs on CT abd/pelvis seen at bases and diffuse interstitial edema on CXR here worse than last study.  - Plan 6 month taper of prednisone, currently increased to 40mg  po BID per pulm. Will need pulmonology follow up.  - ?would macrolide be of benefit  MPO+, ANCA+ RPGN and fibrillary glomerulonephritis: Dx by biopsy. s/p plasmapheresis at previous admission. Discharged on steroids and cytoxan daily but has not been taking prednisone appropriately for unclear reasons and has not received mail-order cytoxan.  - Will continue cytoxan at 50mg  dose qHS (needs to be given after HD per nephrology/pharmacy). Does NOT have access to this at home. Will need to arrange for this at discharge. - On bactrim ppx with immunosuppression.   ESRD: Due to RPGN Dx on biopsy, s/p plasmapheresis x 7 days.   - Nephrology following, routine HD (recently CLIPPED for TTS) thru cath while AVF matures (placed 6/26 by Dr. Donnetta Hutching (required hematoma evacuation 6/27).   Fever: During previous admission, was placed on empiric broad specturm abx while cultures pending.  - Cultures negative and currently afebrile so antibiotics were discontinued.  Abdominal pain, nausea, vomiting: Improved. CT abd/pelvis reassuring. Lipase wnl. Has had cholecystectomy. - Continue pepcid BID given ongoing steroids.  - Miralax prn.   Troponin elevation: Mild in setting of ESRD and hypoxia, no ischemic ECG changes at admission; not consistent with ACS. Initially on heparin, this has been stopped. VQ scan low probability for PE.  - No further interventions planned.   T2DM: HbA1c 6.8%. Anticipate worsening with steroids.  - Continue lantus, SSI. Will watch CBGs today and titrate as needed.  History of cocaine use: UDS negative at this admission (positive last  admission) - Cessation counseling  reviewed.  Anemia of chronic kidney disease and acute blood loss anemia: Transfused 2 units during her most recent admission. Ferritin 958.  Michaelyn Barter weekly per nephrology.  - Monitor CBC  Chronic hepatitis C:  - ID outpatient follow up  Thrombocytopenia:  - Monitor, still well above 50k so can continue DVT ppx.  Insomnia: Having trouble sleeping while inpatient.  - Low dose ambien  DVT prophylaxis: Heparin Code Status: Full Family Communication: None at bedside on my encounter. Son at bedside this PM. Disposition Plan: [facility] home with home health with rollator and possibly oxygen 7/15. Please note she was a rapid bounceback and does not have access to cytoxan at home yet.  Consultants:   Nephrology  Pulmonology  Procedures:   HD 7/11, 7/13  Antimicrobials:  Vancomycin, ceftazidime 7/11   Subjective: Feels generally better, trying not to use oxygen at rest but still gets short of breath requiring oxygen with any exertion. This is not her baseline even since recent discharge. No fever or chest pain.  Objective: Vitals:   03/22/18 2001 03/23/18 0636 03/23/18 0742 03/23/18 1121  BP: (!) 141/66  (!) 156/81 (!) 143/75  Pulse: 66  61 68  Resp: 18     Temp: 98.7 F (37.1 C)   99 F (37.2 C)  TempSrc: Oral   Oral  SpO2: 97%  94% 96%  Weight:  68.1 kg (150 lb 3.2 oz)    Height:        Intake/Output Summary (Last 24 hours) at 03/23/2018 1555 Last data filed at 03/23/2018 1300 Gross per 24 hour  Intake 1414 ml  Output 125 ml  Net 1289 ml   Filed Weights   03/22/18 0740 03/22/18 1145 03/23/18 0636  Weight: 70.3 kg (154 lb 15.7 oz) 67 kg (147 lb 11.3 oz) 68.1 kg (150 lb 3.2 oz)   Gen: Chronically ill-appearing female in no distress Pulm: Nonlabored with supplemental oxygen, tachypneic with any exertion. Distant withOUT rhonchi today. No crackles or wheezes. CV: Regular rate and rhythm. No murmur, rub, or gallop. No JVD, no dependent edema. GI: Abdomen soft,  non-tender, non-distended, with normoactive bowel sounds.  Ext: Warm, no deformities. LUE AVF +thrill. No tenderness or erythema. Skin: No rashes, lesions or ulcers on visualized skin. HD cath in right chest stable..  Neuro: Alert and oriented. No focal neurological deficits. Psych: Judgement and insight appear fair. Mood euthymic & affect congruent. Behavior is appropriate.    Data Reviewed: I have personally reviewed following labs and imaging studies  CBC: Recent Labs  Lab 03/19/18 1820 03/19/18 1834 03/20/18 0534 03/21/18 0329 03/22/18 0453  WBC 6.1  --  7.4 7.8 8.0  NEUTROABS 5.5  --   --   --   --   HGB 7.9* 8.2* 7.3* 7.7* 7.5*  HCT 24.2* 24.0* 23.1* 24.0* 23.6*  MCV 82.6  --  84.0 84.2 82.5  PLT 129*  --  135* 112* 680*   Basic Metabolic Panel: Recent Labs  Lab 03/19/18 1820 03/19/18 1834 03/20/18 0534 03/21/18 0329 03/22/18 0453  NA 142 140 142 139 135  K 3.8 3.7 3.8 4.4 4.7  CL 104 102 102 99 97*  CO2 26  --  25 28 25   GLUCOSE 212* 212* 106* 97 161*  BUN 38* 37* 51* 26* 59*  CREATININE 4.92* 5.10* 6.41* 3.65* 6.35*  CALCIUM 7.7*  --  7.7* 7.8* 8.3*  PHOS  --   --   --   --  3.3   GFR: Estimated Creatinine Clearance: 7.9 mL/min (A) (by C-G formula based on SCr of 6.35 mg/dL (H)). Liver Function Tests: Recent Labs  Lab 03/19/18 1820 03/21/18 0329 03/22/18 0453  AST 20 21  --   ALT 20 16  --   ALKPHOS 54 48  --   BILITOT 1.0 1.0  --   PROT 5.1* 5.1*  --   ALBUMIN 2.9* 2.8* 2.8*   Recent Labs  Lab 03/19/18 1820  LIPASE 24   No results for input(s): AMMONIA in the last 168 hours. Coagulation Profile: No results for input(s): INR, PROTIME in the last 168 hours. Cardiac Enzymes: Recent Labs  Lab 03/19/18 2140 03/20/18 0534 03/20/18 1011  TROPONINI 0.13* 0.11* 0.09*   BNP (last 3 results) No results for input(s): PROBNP in the last 8760 hours. HbA1C: No results for input(s): HGBA1C in the last 72 hours. CBG: Recent Labs  Lab  03/22/18 1959 03/23/18 0018 03/23/18 0457 03/23/18 0739 03/23/18 1120  GLUCAP 301* 147* 196* 289* 268*   Lipid Profile: No results for input(s): CHOL, HDL, LDLCALC, TRIG, CHOLHDL, LDLDIRECT in the last 72 hours. Thyroid Function Tests: No results for input(s): TSH, T4TOTAL, FREET4, T3FREE, THYROIDAB in the last 72 hours. Anemia Panel: Recent Labs    03/22/18 0750  FERRITIN 958*  TIBC 225*  IRON 114   Urine analysis:    Component Value Date/Time   COLORURINE YELLOW 03/19/2018 1738   APPEARANCEUR HAZY (A) 03/19/2018 1738   LABSPEC 1.014 03/19/2018 1738   PHURINE 6.0 03/19/2018 1738   GLUCOSEU 50 (A) 03/19/2018 1738   HGBUR LARGE (A) 03/19/2018 1738   HGBUR trace-intact 03/08/2010 1452   BILIRUBINUR NEGATIVE 03/19/2018 1738   KETONESUR NEGATIVE 03/19/2018 1738   PROTEINUR >=300 (A) 03/19/2018 1738   UROBILINOGEN 0.2 03/08/2010 1452   NITRITE NEGATIVE 03/19/2018 1738   LEUKOCYTESUR TRACE (A) 03/19/2018 1738   Recent Results (from the past 240 hour(s))  Culture, blood (Routine X 2) w Reflex to ID Panel     Status: None   Collection Time: 03/14/18  8:11 AM  Result Value Ref Range Status   Specimen Description BLOOD RIGHT ARM  Final   Special Requests   Final    BOTTLES DRAWN AEROBIC AND ANAEROBIC Blood Culture adequate volume   Culture   Final    NO GROWTH 5 DAYS Performed at [facility], [place] [address] [place], [place] [address]    Report Status 03/19/2018 FINAL  Final  Culture, blood (Routine X 2) w Reflex to ID Panel     Status: None   Collection Time: 03/14/18  8:25 AM  Result Value Ref Range Status   Specimen Description BLOOD RIGHT HAND  Final   Special Requests   Final    BOTTLES DRAWN AEROBIC AND ANAEROBIC Blood Culture adequate volume   Culture   Final    NO GROWTH 5 DAYS Performed at [facility], [place] [address] [place], [place] [address]    Report Status 03/19/2018 FINAL  Final  C difficile quick scan w PCR reflex     Status: None    Collection Time: 03/14/18  9:24 AM  Result Value Ref Range Status   C Diff antigen NEGATIVE NEGATIVE Final   C Diff toxin NEGATIVE NEGATIVE Final   C Diff interpretation No C. difficile detected.  Final    Comment: Performed at [facility], [place] [address], [place], [place] [address]  Culture, blood (single)  Status: None   Collection Time: 03/14/18  4:39 PM  Result Value Ref Range Status   Specimen Description BLOOD CENTRAL LINE  Final   Special Requests   Final    BOTTLES DRAWN AEROBIC AND ANAEROBIC Blood Culture results may not be optimal due to an inadequate volume of blood received in culture bottles   Culture   Final    NO GROWTH 5 DAYS Performed at [facility], [place] [address], [place], [place] [address]    Report Status 03/19/2018 FINAL  Final  Culture, blood (single)     Status: None   Collection Time: 03/14/18  4:39 PM  Result Value Ref Range Status   Specimen Description BLOOD CENTRAL LINE  Final   Special Requests   Final    BOTTLES DRAWN AEROBIC AND ANAEROBIC Blood Culture adequate volume   Culture   Final    NO GROWTH 5 DAYS Performed at [place] [place] [address], [place], [place] [address]    Report Status 03/19/2018 FINAL  Final  Urine culture     Status: None   Collection Time: 03/19/18  5:38 PM  Result Value Ref Range Status   Specimen Description URINE, CLEAN CATCH  Final   Special Requests NONE  Final   Culture   Final    NO GROWTH Performed at [facility], [place] [address], [place], [place] [address]    Report Status 03/20/2018 FINAL  Final      Radiology Studies: Dg Chest 2 View  Result Date: 03/23/2018 CLINICAL DATA:  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[place]) J84.89 (ICD-10-CM) Acute respiratory failure with hypoxia ([place]) J96.01 (ICD-10-CM). EXAM: CHEST - 2 VIEW COMPARISON:  03/19/2018 FINDINGS: Significant improvement in diffuse bilateral airspace disease. Mild residual bilateral airspace disease.  Small left effusion. Cardiac enlargement. Right jugular central venous catheter tip in the lower SVC unchanged. IMPRESSION: Significant improvement in diffuse bilateral airspace disease which may be due to clearing pneumonia or edema. Electronically Signed   By: Franchot Gallo M.D.   On: 03/23/2018 08:40    Scheduled Meds: . Chlorhexidine Gluconate Cloth  6 each Topical Q0600  . Chlorhexidine Gluconate Cloth  6 each Topical Q0600  . cyclophosphamide  50 mg Oral QHS  . Darbepoetin Alfa  40 mcg Intravenous Q Thu-HD  . famotidine  20 mg Oral Daily  . folic acid  1 mg Oral Daily  . heparin injection (subcutaneous)  5,000 Units Subcutaneous Q8H  . insulin aspart  0-9 Units Subcutaneous Q4H  . insulin glargine  5 Units Subcutaneous Daily  . multivitamin with minerals  1 tablet Oral Daily  . polyethylene glycol  17 g Oral BID  . predniSONE  40 mg Oral BID  . [START ON 03/26/2018] predniSONE  40 mg Oral Q breakfast  . sulfamethoxazole-trimethoprim  1 tablet Oral Once per day on Mon Wed Fri  . thiamine  100 mg Oral Daily   Or  . thiamine  100 mg Intravenous Daily   Continuous Infusions:   LOS: 2 days   Time spent: 25 minutes.  Patrecia Pour, MD [facility] [email or web address] Password [facility] 03/23/2018, 3:55 PM

## 2018-03-24 DIAGNOSIS — J810 Acute pulmonary edema: Secondary | ICD-10-CM

## 2018-03-24 LAB — GLUCOSE, CAPILLARY
GLUCOSE-CAPILLARY: 142 mg/dL — AB (ref 70–99)
Glucose-Capillary: 195 mg/dL — ABNORMAL HIGH (ref 70–99)
Glucose-Capillary: 216 mg/dL — ABNORMAL HIGH (ref 70–99)
Glucose-Capillary: 216 mg/dL — ABNORMAL HIGH (ref 70–99)

## 2018-03-24 MED ORDER — TRAMADOL HCL 50 MG PO TABS: 50.0000 mg | ORAL_TABLET | Freq: Two times a day (BID) | ORAL | 0 refills | Status: DC | PRN

## 2018-03-24 MED ORDER — TRAMADOL HCL 50 MG PO TABS
50.0000 mg | ORAL_TABLET | Freq: Two times a day (BID) | ORAL | Status: DC | PRN
Start: 2018-03-24 — End: 2018-03-24
  Administered 2018-03-24: 50 mg via ORAL
  Filled 2018-03-24: qty 1

## 2018-03-24 MED ORDER — CHLORHEXIDINE GLUCONATE CLOTH 2 % EX PADS
6.0000 | MEDICATED_PAD | Freq: Every day | CUTANEOUS | Status: DC
  Administered 2018-03-24: 6 via TOPICAL

## 2018-03-24 MED ORDER — PREDNISONE 20 MG PO TABS: 40.0000 mg | ORAL_TABLET | Freq: Every day | ORAL | 0 refills | Status: DC

## 2018-03-24 MED ORDER — INSULIN PEN NEEDLE 31G X 5 MM MISC: 0 refills | Status: DC

## 2018-03-24 MED ORDER — INSULIN GLARGINE 100 UNIT/ML ~~LOC~~ SOLN
10.0000 [IU] | Freq: Every day | SUBCUTANEOUS | Status: DC
  Administered 2018-03-24: 10 [IU] via SUBCUTANEOUS
  Filled 2018-03-24: qty 0.1

## 2018-03-24 MED ORDER — INSULIN ASPART 100 UNIT/ML ~~LOC~~ SOLN
0.0000 [IU] | Freq: Three times a day (TID) | SUBCUTANEOUS | Status: DC
  Administered 2018-03-24: 2 [IU] via SUBCUTANEOUS
  Administered 2018-03-24: 5 [IU] via SUBCUTANEOUS

## 2018-03-24 MED ORDER — CYCLOPHOSPHAMIDE 50 MG PO CAPS: 50.0000 mg | ORAL_CAPSULE | Freq: Every day | ORAL | 0 refills | Status: DC

## 2018-03-24 NOTE — Progress Notes (Signed)
[facility] Progress Note   Subjective:   Feeling well. Episode of diarrhea yesterday, improved today.  Breathing better, denies SOB and CP.   Objective Vitals:   03/23/18 2015 03/24/18 0423 03/24/18 0427 03/24/18 0819  BP: (!) 154/70 (!) 157/81  (!) 170/79  Pulse: 68 62    Resp: 18 18    Temp: 98.6 F (37 C) 97.6 F (36.4 C)    TempSrc: Oral Oral    SpO2: 99% 99%  96%  Weight:   68.8 kg (151 lb 9.6 oz)   Height:       Physical Exam General:NAD, chronically ill appearing female Heart:RRR Lungs:mostly CTAB, nml WOB w/o O2 Abdomen:soft, NTND Extremities:no LE edema Dialysis Access: LU AVF maturing (placed 6/26). R IJ [facility]   Filed Weights   03/22/18 1145 03/23/18 0636 03/24/18 0427  Weight: 67 kg (147 lb 11.3 oz) 68.1 kg (150 lb 3.2 oz) 68.8 kg (151 lb 9.6 oz)    Intake/Output Summary (Last 24 hours) at 03/24/2018 1121 Last data filed at 03/24/2018 0428 Gross per 24 hour  Intake 240 ml  Output 200 ml  Net 40 ml    Additional Objective Labs: Basic Metabolic Panel: Recent Labs  Lab 03/20/18 0534 03/21/18 0329 03/22/18 0453  NA 142 139 135  K 3.8 4.4 4.7  CL 102 99 97*  CO2 25 28 25   GLUCOSE 106* 97 161*  BUN 51* 26* 59*  CREATININE 6.41* 3.65* 6.35*  CALCIUM 7.7* 7.8* 8.3*  PHOS  --   --  3.3   Liver Function Tests: Recent Labs  Lab 03/19/18 1820 03/21/18 0329 03/22/18 0453  AST 20 21  --   ALT 20 16  --   ALKPHOS 54 48  --   BILITOT 1.0 1.0  --   PROT 5.1* 5.1*  --   ALBUMIN 2.9* 2.8* 2.8*   Recent Labs  Lab 03/19/18 1820  LIPASE 24   CBC: Recent Labs  Lab 03/19/18 1820  03/20/18 0534 03/21/18 0329 03/22/18 0453  WBC 6.1  --  7.4 7.8 8.0  NEUTROABS 5.5  --   --   --   --   HGB 7.9*   < > 7.3* 7.7* 7.5*  HCT 24.2*   < > 23.1* 24.0* 23.6*  MCV 82.6  --  84.0 84.2 82.5  PLT 129*  --  135* 112* 110*   < > = values in this interval not displayed.   Blood Culture    Component Value Date/Time   SDES URINE, CLEAN CATCH  03/19/2018 1738   SPECREQUEST NONE 03/19/2018 1738   CULT  03/19/2018 1738    NO GROWTH Performed at [facility], [place] [address] [place], [place] [address]    REPTSTATUS 03/20/2018 FINAL 03/19/2018 1738    Cardiac Enzymes: Recent Labs  Lab 03/19/18 2140 03/20/18 0534 03/20/18 1011  TROPONINI 0.13* 0.11* 0.09*   CBG: Recent Labs  Lab 03/23/18 1636 03/23/18 2016 03/24/18 0000 03/24/18 0421 03/24/18 0727  GLUCAP 316* 286* 216* 195* 142*   Iron Studies:  Recent Labs    03/22/18 0750  IRON 114  TIBC 225*  FERRITIN 958*   Lab Results  Component Value Date   INR 1.20 02/25/2018   INR 1.07 02/12/2018   Studies/Results: Dg Chest 2 View  Result Date: 03/23/2018 CLINICAL DATA:  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[place]) J84.89 (ICD-10-CM) Acute respiratory failure with hypoxia ([facility]CC) J96.01 (ICD-10-CM). EXAM: CHEST - 2 VIEW COMPARISON:  03/19/2018 FINDINGS:  Significant improvement in diffuse bilateral airspace disease. Mild residual bilateral airspace disease. Small left effusion. Cardiac enlargement. Right jugular central venous catheter tip in the lower SVC unchanged. IMPRESSION: Significant improvement in diffuse bilateral airspace disease which may be due to clearing pneumonia or edema. Electronically Signed   By: Franchot Gallo M.D.   On: 03/23/2018 08:40    Medications:  . Chlorhexidine Gluconate Cloth  6 each Topical Q0600  . Chlorhexidine Gluconate Cloth  6 each Topical Q0600  . cyclophosphamide  50 mg Oral QHS  . Darbepoetin Alfa  40 mcg Intravenous Q Thu-HD  . famotidine  20 mg Oral Daily  . folic acid  1 mg Oral Daily  . heparin injection (subcutaneous)  5,000 Units Subcutaneous Q8H  . insulin aspart  0-15 Units Subcutaneous TID WC  . insulin glargine  10 Units Subcutaneous Daily  . multivitamin with minerals  1 tablet Oral Daily  . polyethylene glycol  17 g Oral BID  . predniSONE  40 mg Oral BID  . [START ON 03/26/2018]  predniSONE  40 mg Oral Q breakfast  . sulfamethoxazole-trimethoprim  1 tablet Oral Once per day on Mon Wed Fri  . thiamine  100 mg Oral Daily   Or  . thiamine  100 mg Intravenous Daily    Dialysis Orders: [facility]  4h  69.5kg   2/2.25  Heparin 2000  - mircera 50 every 2, start 7/11  - calcitriol 0.25 ug tiw   Assessment/Plan: 1. SOB/chest pain/ recurrent pulmonary infiltrates: recurrent BOOP per pulmonary.  repeat CXR on 7/14 showed significant improvement.  -Prednisone^40mg  BID x4d then back to 40mg  qd, followed by a slow taper x67months.    -Prednisone taper should be managed by pulm team post dc for this dx.  Appreciate input. 2. ESRD - TTS HD.  Continue per regular schedule while admitted.   -recent ESRD w/MPO ANCA RPGN+ underlying fibrillary GN  -on maintenance PO cytoxan at ESRD dose 50mg  qhs  -Dr. Joelyn Oms to manage cytoxan dose as OP post d/c 3. Anemia of CKD- Hgb 7.7>7.5. Fe 51%.  No indication for Fe at this time.  Continue ESA, last dose 69mcg aranesp on 7/11.  Follow. 4. Secondary hyperparathyroidism - Ca 8.3, Phos 3.3. Continue VDRA. Not on binders.  5. HTN/volume - BP^, Not on antihypertensives. Appears euvolemic on exam, getting under EDW.  Continue to titrate down volume as tolerated.  Will likely need new EDW at d/c.  6. Nutrition -  Alb 2.8. Renal diet w/fluid restrictions.  7. Hx. Cocaine/ETOH abuse 8. Hx Thrombocytopenia - etiology unclear 9. Chronic Hep C - per primary  Jen Mow, PA-C [facility] Pager: [phone] 03/24/2018,11:21 AM  LOS: 3 days

## 2018-03-24 NOTE — Progress Notes (Signed)
PT Cancellation Note  Patient Details Name: Rhonda Lynch MRN: [id number] DOB: [birth year]   Cancelled Treatment:    Reason Eval/Treat Not Completed: Other (comment)(Pt with diarrhea. Declined PT at this time.)   Denice Paradise 03/24/2018, 1:05 PM [facility] [phone] (pager)

## 2018-03-24 NOTE — Progress Notes (Signed)
SATURATION QUALIFICATIONS: (This note is used to comply with regulatory documentation for home oxygen)  Patient Saturations on Room Air at Rest = 94%  Patient Saturations on Room Air while Ambulating = 87%  Patient Saturations on 2L Liters of oxygen while Ambulating = 93%  Please briefly explain why patient needs home oxygen: The patient was unable to maintain saturations greater than 90% while ambulating.   Saddie Benders RN

## 2018-03-24 NOTE — Discharge Summary (Addendum)
Physician Discharge Summary  LISET MCMONIGLE HBZ:[id number] DOB: Oct 13, 1955 DOA: 03/19/2018  PCP: Patient, No Pcp Per  Admit date: 03/19/2018 Discharge date: 03/24/2018  Admitted From: Home Disposition: Home   Recommendations for Outpatient Follow-up:  1. Follow up with pulmonology and nephrology within the next month. Continue cyclophosphamide 50mg  qHS and prednisone 40mg  qAM.  2. Follow up with PCP (new patient appointment scheduled prior to discharge) as scheduled.   Home Health: PT ordered Equipment/Devices: 2L O2 Discharge Condition: Stable CODE STATUS: Full Diet recommendation: Renal  Brief/Interim Summary: Per HPI: 62 y.o.femalewith medical history significant ofCocaine and EtOH abuse. Recent admit to our service for a month long hospital stay this past month, discharged 4 days ago.62 year old female with past medical history of cocaine/alcohol abuse who was admitted on 02/12/2018 with abdominal pain, nausea, vomiting. She was found to have Streptococcus pneumonia and also had acute kidney injury on presentation. Nephrology was consulted . 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 She has progressed to end-stage renal disease and is currently undergoing dialysis, plasmapheresis and has been started, on steroids and cyclophosphamide. Discharge planning after setting of outpatient dialysis facility. Received AV graft on 6/26/2019with hematoma which was subsequently evacuated  Readmitted 7/10 with shortness of breath and has continued to be newly hypoxic. Prednisone was increased by pulmonology and cytoxan continued (pt had not been able to take this as it was delayed by mail order). She has improved significantly but continues to have oxygen requirement. Per pulmonology, steroids to be continued  for protracted time course and will be managed by pulmonology as an outpatient. She will continue supplemental oxygen. She will follow up with nephrology for ongoing cyclophosphamide therapy.   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 VQ scan low probability (avoided contrast per nephrology due to possibility of renal recovery). - Pulmonology consulted, treating BOOP as below.  - Continue supplemental oxygen at home. - CXR showed improvement.  BOOP: Previously considered diffuse alveolar hemorrhage vs. BOOP/COP. No current hemoptysis. Bibasilar GGOs on CT abd/pelvis seen at bases and diffuse interstitial edema on CXR here worse than last study from previous discharge. With therapy as below, CXR has shown significant improvement.  - Plan 6 month taper of prednisone, now decreasing 40mg  po BID >> 40mg  daily per pulm. Will need pulmonology follow up.   MPO+, ANCA+ RPGN and fibrillary glomerulonephritis: Dx by biopsy. s/p plasmapheresis at previous admission. Discharged on steroids and cytoxan daily but has not been taking prednisone appropriately for unclear reasons and has not received mail-order cytoxan.  - Will continue cytoxan at 50mg  dose qHS (needs to be given after HD per nephrology/pharmacy).  - Care management consulted for this. Unfortunately patient has no insurance, and no medicaid, and already got the [facility] letter last admission (once per year). Only local pharmacies will take [facility] letter and the patient's pharmacy didn't have cytoxan and ordered it in the mail. Per report, this voids the [facility] letter benefit. CM has been calling to get  renewed [place] letter and local pharmacy with available supply. - On bactrim ppx with  immunosuppression.   ESRD: Due to RPGN Dx on biopsy, s/p plasmapheresis x 7 days.   - Nephrology following, routine HD (recently CLIPPED for TTS) thru cath while AVF matures (placed 6/26 by Dr. Donnetta Hutching (required hematoma evacuation 6/27).   Fever: During previous admission, was placed on empiric broad specturm abx while cultures pending.  - Cultures negative and currently afebrile so antibiotics were discontinued. She has remained afebrile.   Abdominal pain, nausea, vomiting: Improved. CT abd/pelvis reassuring. Lipase wnl. Has had cholecystectomy. - Continue pepcid BID given ongoing steroids.  - Miralax prn.   Troponin elevation: Mild in setting of ESRD and hypoxia, no ischemic ECG changes at admission; not consistent with ACS. Initially on heparin, this has been stopped. VQ scan low probability for PE.  - No further interventions planned.   T2DM: HbA1c 6.8%. Anticipate worsening with steroids.  - Continue lantus, SSI. Prescribed insulin pen needles (not prescribed at last discharge with lantus)  History of cocaine use: UDS negative at this admission (positive last admission) - Cessation counseling reviewed.  Anemia of chronic kidney disease and acute blood loss anemia: Transfused 2 units during her most recent admission. Ferritin 958.  Michaelyn Barter weekly per nephrology.  - Monitor CBC  Chronic hepatitis C:  - ID outpatient follow up  Thrombocytopenia:  - Monitor, stable  Insomnia: Having trouble sleeping while inpatient.  - Low dose Lorrin Mais was given but not prescribed at discharge.  Headache: Possibly related to steroids. No red flag features.  - Will prescribe tramadol 50mg  tabs #10 no refills in hopes of improving adherence to other therapies.   Discharge Instructions Discharge Instructions    Discharge instructions   Complete by:  As directed    You must take prednisone every  morning. Take 40mg  (2 tablets) every day. You will be on this for months and need to follow up with the [facility] clinic within the next month for refills.   You will also need to take cytoxan every night. This prescription was sent electronically to the [facility] and will be available tomorrow. You absolutely must go get this tomorrow and take it in the evening. Follow up with Dr. Joelyn Oms within the next month to get refills of this as well.   Continue all your other medications as you were. Insulin pen needles have been prescribed as well.   You will be getting home oxygen and a rollator for home.   If your shortness of breath worsens or you get a fever, seek medical attention right away.   For home use only DME 4 wheeled rolling walker with seat   Complete by:  As directed    Patient needs a walker to treat with the following condition:  Gait instability     Allergies as of 03/24/2018   No Known Allergies     Medication List    STOP taking these medications   Ceftazidime 2 g Solr injection Commonly known as:  FORTAZ   vancomycin 750 mg in sodium chloride 0.9 % 150 mL     TAKE these medications   acetaminophen 325 MG tablet Commonly known as:  TYLENOL Take 325-650 mg by mouth every 6 (six) hours as needed (for pain or headaches).   cyclophosphamide 50 MG capsule Commonly known as:  CYTOXAN Take 1 capsule (50 mg total) by mouth daily. Give on an empty stomach 1 hour before or 2 hours after meals.   Darbepoetin Alfa 40 MCG/0.4ML Sosy injection Commonly known as:  ARANESP Inject 0.4 mLs (40 mcg total) into the vein every Thursday with hemodialysis.   famotidine 20 MG tablet Commonly known as:  PEPCID Take 1 tablet (20 mg total) by mouth daily.   Insulin Glargine 100 UNIT/ML Solostar Pen Commonly known as:  LANTUS Inject 5 Units into the skin daily. What changed:  when to take this   Insulin Pen Needle 31G X 5 MM Misc Use for once daily  injections   multivitamin with minerals Tabs tablet Take 1 tablet by mouth daily.   predniSONE 20 MG tablet Commonly known as:  DELTASONE Take 2 tablets (40 mg total) by mouth daily with breakfast.   sulfamethoxazole-trimethoprim 800-160 MG tablet Commonly known as:  BACTRIM DS,SEPTRA DS Take 1 tablet by mouth 3 (three) times a week. What changed:  when to take this   thiamine 100 MG tablet Take 1 tablet (100 mg total) by mouth daily.   traMADol 50 MG tablet Commonly known as:  ULTRAM Take 1 tablet (50 mg total) by mouth every 12 (twelve) hours as needed (refractory pain).            Durable Medical Equipment  (From admission, onward)        Start     Ordered   03/24/18 1552  For home use only DME oxygen  Once    Question Answer Comment  Mode or (Route) Nasal cannula   Liters per Minute 2   Frequency Continuous (stationary and portable oxygen unit needed)   Oxygen delivery system Gas      03/24/18 1552   03/24/18 1220  For home use only DME Walker rolling  Once    Question:  Patient needs a walker to treat with the following condition  Answer:  Gait instability   03/24/18 1219   03/24/18 0000  For home use only DME 4 wheeled rolling walker with seat    Question:  Patient needs a walker to treat with the following condition  Answer:  Gait instability   03/24/18 1601     Follow-up Information    Rexene Agent, MD. Schedule an appointment as soon as possible for a visit.   Specialty:  Nephrology Contact information: [place] [address] [phone]        Rush Farmer, MD. Schedule an appointment as soon as possible for a visit.   Specialty:  Pulmonary Disease Contact information: [address] [address] [phone]        [facility] Follow up.   Specialty:  Home Health Services Why:  Physical Therapy- office to call and schedule a visit.  Contact information: [place] [address] [place] Follow up.   Why:  Conservation officer, nature with [place], Oxygen Contact information: [place] [address] [phone]        [facility] Follow up on 03/25/2018.   Why:  @ 9:00 am with Dr. Altamease Oiler for hospital follow up appointment.  Contact information: [place] [address] [place]. Go to.   Why:  this location to get assistance with medications. Please take Match Letter to this location.  Contact information: [address]  [place] [address] [phone]         No Known Allergies  Consultations:  Pulmonology  Nephrology  Procedures/Studies: Ct Abdomen Pelvis Wo Contrast  Result Date: 03/19/2018 CLINICAL DATA:  62 year old female with reported kidney failure, recently discharged for pneumonia presents with abdominal pain. EXAM: CT ABDOMEN AND PELVIS WITHOUT CONTRAST TECHNIQUE: Multidetector CT imaging of the abdomen and pelvis was performed following the standard protocol without IV contrast. COMPARISON:  02/12/2018 FINDINGS: Lower chest: Cardiomegaly with small bilateral right greater than left pleural effusions. Redemonstration of bilateral bibasilar ground-glass opacities with inter and intra lobular septal thickening in a crazy paving pattern that can be seen with cryptogenic organizing pneumonia. Superimposed stigmata of CHF given presence of bilateral pleural effusions is not excluded. Hepatobiliary: The unenhanced liver is normal given limitations of a noncontrast study. The patient is status post cholecystectomy. Pancreas: Normal Spleen: Normal Adrenals/Urinary Tract: Mild bilateral perinephric fat stranding without obstructive uropathy. The urinary bladder is unremarkable for degree of distention. Stomach/Bowel: The stomach is decompressed.  Normal small bowel rotation without obstruction or inflammation. The distal and terminal ileum and appendix are normal. Unremarkable appearance of the colon with average amount of fecal retention noted. Vascular/Lymphatic: Moderate aorto iliac atherosclerosis without aneurysm. No adenopathy. Reproductive: Hysterectomy. Small fat containing dermoid in the left ovary measuring 15 mm, stable in appearance. Other: No free air nor free fluid. Small fat containing umbilical hernia. Musculoskeletal: No acute or significant osseous findings. Minimal anterolisthesis grade 1 of L4 on L5 due to facet arthropathy. IMPRESSION: 1. Bibasilar ground-glass opacities with interlobular and intralobular septal thickening sparing the subpleural lung. Findings can be seen in cryptogenic organizing pneumonia. Superimposed pulmonary edema would be difficult to entirely exclude given presence of new bilateral small pleural effusions, right greater than left. 2. 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 3. Minimal anterolisthesis grade 1 of L4 on L5 secondary to degenerative facet arthropathy. Electronically Signed   By: Ashley Royalty M.D.   On: 03/19/2018 20:29   Dg Chest 2 View  Result Date: 03/23/2018 CLINICAL DATA:  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[place]) J84.89 (ICD-10-CM) Acute respiratory failure with hypoxia ([place]) J96.01 (ICD-10-CM). EXAM: CHEST - 2 VIEW COMPARISON:  03/19/2018 FINDINGS: Significant improvement in diffuse bilateral airspace disease. Mild residual bilateral airspace disease. Small left effusion. Cardiac enlargement. Right jugular central venous catheter tip in the lower SVC unchanged. IMPRESSION: Significant improvement in diffuse bilateral airspace disease which may be due to clearing pneumonia or edema. Electronically Signed   By: Franchot Gallo M.D.   On: 03/23/2018 08:40   Dg Chest 2 View  Result Date: 03/19/2018 CLINICAL DATA:  Abdominal pain.  Recent dialysis shunt placement.  EXAM: CHEST - 2 VIEW COMPARISON:  03/14/2018 and 03/06/2018 FINDINGS: Cardiomegaly with mild aortic atherosclerosis. Diffuse interstitial edema persists with small bilateral pleural effusions. Right IJ dialysis catheter terminates at the cavoatrial junction and is unchanged in appearance. There is pneumothorax. No acute osseous abnormality. IMPRESSION: Stable cardiomegaly with mild aortic atherosclerosis. Diffuse interstitial edema is again noted without significant change. Small bilateral pleural effusions. Electronically Signed   By: Ashley Royalty M.D.   On: 03/19/2018 17:03   Nm Pulmonary Vent And Perf (v/q Scan)  Result Date: 03/20/2018 CLINICAL DATA:  Shortness of Breath EXAM: NUCLEAR MEDICINE VENTILATION - PERFUSION LUNG SCAN VIEWS: Anterior, posterior, left lateral, right lateral, RPO, LPO, RAO, LAO-ventilation and perfusion RADIOPHARMACEUTICALS:  31.6 mCi of Tc-70m DTPA aerosol inhalation and 4.2 mCi Tc48m-MAA IV COMPARISON:  Chest radiograph March 19, 2018 FINDINGS: Ventilation: Radiotracer uptake is homogeneous and symmetric bilaterally. No ventilation defects are evident. Perfusion: Radiotracer uptake  is homogeneous and symmetric bilaterally. No appreciable perfusion defects. IMPRESSION: No appreciable ventilation or perfusion defects. This study constitutes a very low probability of pulmonary embolus. Electronically Signed   By: Lowella Grip III M.D.   On: 03/20/2018 10:20   Ir Fluoro Guide Cv Line Right  Result Date: 02/26/2018 INDICATION: 30 year old with acute kidney injury and on dialysis. Patient currently has a non tunneled dialysis catheter and needs conversion to a tunneled dialysis catheter. EXAM: CONVERSION OF NON TUNNELED DIALYSIS CATHETER TO A TUNNELED DIALYSIS CATHETER WITH FLUOROSCOPY Physician: Stephan Minister. Anselm Pancoast, MD MEDICATIONS: Ancef 2 g; The antibiotic was administered within an appropriate time interval prior to skin puncture. ANESTHESIA/SEDATION: Versed 1.5 mg IV; Fentanyl 100 mcg  IV; Moderate Sedation Time:  20 minutes The patient was continuously monitored during the procedure by the interventional radiology nurse under my direct supervision. FLUOROSCOPY TIME:  Fluoroscopy Time: 18 seconds, 1 mGy COMPLICATIONS: None immediate. PROCEDURE: The existing right jugular dialysis catheter was evaluated. This catheter was felt to be adequate for conversion to a tunneled catheter. The procedure was explained to the patient. The risks and benefits of the procedure were discussed and the patient's questions were addressed. Informed consent was obtained from the patient. The existing catheter and the right upper chest were prepped and draped in a sterile fashion. Maximal barrier sterile technique was utilized including caps, mask, sterile gowns, sterile gloves, sterile drape, hand hygiene and skin antiseptic. The skin below the right clavicle and the skin around the catheter were anesthetized with 1% lidocaine. A small incision was made below the right clavicle. A subcutaneous tunnel was formed from the incision to the old catheter site. The old catheter was removed over a Amplatz wire. The wire was advanced into the IVC. A 19 cm tip to cuff Palindrome catheter was pulled through the subcutaneous tunnel. Peel-away sheath was placed over the Amplatz wire. The catheter was placed through the peel-away sheath and positioned at the superior cavoatrial junction. Both lumens aspirated and flushed well. The catheter lumens were flushed with appropriate amount of heparin. The catheter was sutured to skin with Prolene suture. The old jugular right access site was closed using 2 interrupted Ethilon sutures. Fluoroscopic images were taken and saved for this procedure. FINDINGS: Catheter tip at the superior cavoatrial junction. IMPRESSION: Successful conversion of the right jugular catheter to a tunneled dialysis catheter. Right neck sutures can be removed in 10-14 days. Electronically Signed   By: Markus Daft M.D.    On: 02/26/2018 17:16   Dg Chest Port 1 View  Result Date: 03/14/2018 CLINICAL DATA:  Fever EXAM: PORTABLE CHEST 1 VIEW COMPARISON:  March 06, 2018 FINDINGS: Central catheter tip is in the superior vena cava near the cavoatrial junction. No pneumothorax. There is mild interstitial edema diffusely with minimal pleural effusions bilaterally. No consolidation. Heart is mildly enlarged with pulmonary venous hypertension. No adenopathy. There is aortic atherosclerosis. No bone lesions. IMPRESSION: Central catheter as described without pneumothorax. Cardiomegaly with mild pulmonary venous hypertension consistent with a degree of pulmonary vascular congestion. Mild interstitial edema with minimal pleural effusions. No consolidation. There is aortic atherosclerosis. Aortic Atherosclerosis (ICD10-I70.0). Electronically Signed   By: Lowella Grip III M.D.   On: 03/14/2018 07:30   Dg Chest Port 1 View  Result Date: 03/06/2018 CLINICAL DATA:  Fever. EXAM: PORTABLE CHEST 1 VIEW COMPARISON:  CT 02/17/2018.  Chest x-ray 02/14/2018. FINDINGS: Right IJ dual-lumen catheter with tip over cavoatrial junction. Cardiomegaly. No focal infiltrate. No pleural effusion or pneumothorax.  No acute bony abnormality. J IMPRESSION: 1.  Right IJ dual-lumen catheter with tip over cavoatrial junction. 2. No acute cardiopulmonary disease. No evidence of pulmonary infiltrate. Electronically Signed   By: Marcello Moores  Register   On: 03/06/2018 13:47     Subjective: Dyspnea improved but still hypoxic with exertion. No chest pain. No abdominal pain N/V. Having headache improved with tramadol. No vision changes, unilateral features/weakness, numbness, tingling. Denies history of seizures.   Discharge Exam: Vitals:   03/24/18 0819 03/24/18 1544  BP: (!) 170/79 (!) 191/89  Pulse:  60  Resp:    Temp:  98.6 F (37 C)  SpO2: 96% 100%   General: Pt is alert, awake, not in acute distress Cardiovascular: RRR, S1/S2 +, no rubs, no  gallops Respiratory: CTA bilaterally, no wheezing, no rhonchi Abdominal: Soft, NT, ND, bowel sounds + Extremities: No edema, no cyanosis  Labs: BNP (last 3 results) Recent Labs    02/12/18 1534  BNP 654.6*   Basic Metabolic Panel: Recent Labs  Lab 03/19/18 1820 03/19/18 1834 03/20/18 0534 03/21/18 0329 03/22/18 0453  NA 142 140 142 139 135  K 3.8 3.7 3.8 4.4 4.7  CL 104 102 102 99 97*  CO2 26  --  25 28 25   GLUCOSE 212* 212* 106* 97 161*  BUN 38* 37* 51* 26* 59*  CREATININE 4.92* 5.10* 6.41* 3.65* 6.35*  CALCIUM 7.7*  --  7.7* 7.8* 8.3*  PHOS  --   --   --   --  3.3   Liver Function Tests: Recent Labs  Lab 03/19/18 1820 03/21/18 0329 03/22/18 0453  AST 20 21  --   ALT 20 16  --   ALKPHOS 54 48  --   BILITOT 1.0 1.0  --   PROT 5.1* 5.1*  --   ALBUMIN 2.9* 2.8* 2.8*   Recent Labs  Lab 03/19/18 1820  LIPASE 24   No results for input(s): AMMONIA in the last 168 hours. CBC: Recent Labs  Lab 03/19/18 1820 03/19/18 1834 03/20/18 0534 03/21/18 0329 03/22/18 0453  WBC 6.1  --  7.4 7.8 8.0  NEUTROABS 5.5  --   --   --   --   HGB 7.9* 8.2* 7.3* 7.7* 7.5*  HCT 24.2* 24.0* 23.1* 24.0* 23.6*  MCV 82.6  --  84.0 84.2 82.5  PLT 129*  --  135* 112* 110*   Cardiac Enzymes: Recent Labs  Lab 03/19/18 2140 03/20/18 0534 03/20/18 1011  TROPONINI 0.13* 0.11* 0.09*   BNP: Invalid input(s): POCBNP CBG: Recent Labs  Lab 03/23/18 2016 03/24/18 0000 03/24/18 0421 03/24/18 0727 03/24/18 1212  GLUCAP 286* 216* 195* 142* 216*   D-Dimer No results for input(s): DDIMER in the last 72 hours. Hgb A1c No results for input(s): HGBA1C in the last 72 hours. Lipid Profile No results for input(s): CHOL, HDL, LDLCALC, TRIG, CHOLHDL, LDLDIRECT in the last 72 hours. Thyroid function studies No results for input(s): TSH, T4TOTAL, T3FREE, THYROIDAB in the last 72 hours.  Invalid input(s): FREET3 Anemia work up Recent Labs    03/22/18 0750  FERRITIN 958*  TIBC 225*   IRON 114   Urinalysis    Component Value Date/Time   COLORURINE YELLOW 03/19/2018 1738   APPEARANCEUR HAZY (A) 03/19/2018 1738   LABSPEC 1.014 03/19/2018 1738   PHURINE 6.0 03/19/2018 1738   GLUCOSEU 50 (A) 03/19/2018 1738   HGBUR LARGE (A) 03/19/2018 1738   HGBUR trace-intact 03/08/2010 1452   BILIRUBINUR NEGATIVE 03/19/2018 1738  KETONESUR NEGATIVE 03/19/2018 1738   PROTEINUR >=300 (A) 03/19/2018 1738   UROBILINOGEN 0.2 03/08/2010 1452   NITRITE NEGATIVE 03/19/2018 1738   LEUKOCYTESUR TRACE (A) 03/19/2018 1738    Microbiology Recent Results (from the past 240 hour(s))  Culture, blood (single)     Status: None   Collection Time: 03/14/18  4:39 PM  Result Value Ref Range Status   Specimen Description BLOOD CENTRAL LINE  Final   Special Requests   Final    BOTTLES DRAWN AEROBIC AND ANAEROBIC Blood Culture results may not be optimal due to an inadequate volume of blood received in culture bottles   Culture   Final    NO GROWTH 5 DAYS Performed at [facility], [place] [address], [place], [place] [address]    Report Status 03/19/2018 FINAL  Final  Culture, blood (single)     Status: None   Collection Time: 03/14/18  4:39 PM  Result Value Ref Range Status   Specimen Description BLOOD CENTRAL LINE  Final   Special Requests   Final    BOTTLES DRAWN AEROBIC AND ANAEROBIC Blood Culture adequate volume   Culture   Final    NO GROWTH 5 DAYS Performed at [facility] [address] [place], [place] [address]    Report Status 03/19/2018 FINAL  Final  Urine culture     Status: None   Collection Time: 03/19/18  5:38 PM  Result Value Ref Range Status   Specimen Description URINE, CLEAN CATCH  Final   Special Requests NONE  Final   Culture   Final    NO GROWTH Performed at [facility], [place] [address] [place], [place] [address]    Report Status 03/20/2018 FINAL  Final    Time coordinating discharge: Approximately 40 minutes  Patrecia Pour, MD  [facility] 03/24/2018, 4:16 PM Pager [phone]

## 2018-03-24 NOTE — Progress Notes (Signed)
Name: Rhonda Lynch MRN: [id number] DOB: Nov 25, 1955    ADMISSION DATE:  03/19/2018 CONSULTATION DATE:    Loistine Chance MD : Jonnie Finner  CHIEF COMPLAINT:  Dyspnea / Chest pain/recurrent bilat diffuse fine infiltrates   BRIEF PATIENT DESCRIPTION:  Obese female supine in bed wearing nasal oxygen at 2 L [place] , saturation is 94%. She is eating lunch  SIGNIFICANT EVENTS  6/5 admit with abd pain and hypoxia, AKI found, strep pneumonia +.  6/10 renal biopsy>>MPO+ RPGN 03/15/2018>> Discharge from [facility] 03/19/2018>> Readmission  STUDIES:  CXR 7/10 > Diffuse interstitial edema is again noted without significant change. Small bilateral pleural effusions.  V/Q scan 7/11 low prob for PE  CT abdomen  7/10 > Bibasilar ground-glass opacities with interlobular and intralobular septal thickening sparing the subpleural lung. Findings can be seen in cryptogenic organizing pneumonia. Superimposed pulmonary edema would be difficult to entirely exclude given presence of new bilateral small pleural effusions, right greater than left  HRCT Chest 02/17/2018 The appearance of the lungs is nonspecific, but there are imaging findings that are suggestive of cryptogenic organizing pneumonia (COP). Other differential considerations include both cardiogenic and noncardiogenic edema, however, this is not strongly favored.  HISTORY OF PRESENT ILLNESS:   The patient is a 62 y.o. year-old presenting w/ abd pain, nausea and vomiting, subjective fevers and chills as well x 2 days.  She was recently in hospital for severe PNA x 1 month and had renal failure and started on dialysis.  Was dc'd 6 days ago and went to OP HD once 2 days ago.   Prior recent admit from June 5 - July 6th >> pt w/ hx cocaine/ etoh abuse admitted for abd pain/ n/v, had strep pneumoniae PNA and AKI.  Renal biopsy showed MPO+ RPGN.  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 She was hep C +.  She was treated w/  plasampheresis, dialysis, IV steroids and dc'd on po pred and po cytoxan for renal failure. CXR showed improvement. AV graft was placed on 6/26 f/b hematoma evacuation.After high dose steroids the CXR had cleared up (see 6/25 film) but now has recurrent bilat diffuse fine infiltrates which looks her initial pattern. She was discharged on  PO prednisone and cyclosporin.  She was confused when I asked her if she was taking her medications at home, specifically prednisone and cyclosporin. I did a pill count on the 10 mg prednisone >> 68 pills  were dispensed, 64 remained in bottle at my count.. I suspect she took 1 dose of 40 mg  in the 4 days between discharge and admission, or perhaps was confused and took 1 10 mg tab for 4 days.. Cyclosporin had not been taken due to the fact it was mail ordered. PCCM have been asked to consult and evaluate her again for her dyspnea and diffuse bilateral pulmonary infiltrates  SUBJECTIVE:  No events overnight, feels better this AM.  VITAL SIGNS: Temp:  [97.6 F (36.4 C)-98.7 F (37.1 C)] 97.6 F (36.4 C) (07/15 0423) Pulse Rate:  [62-68] 62 (07/15 0423) Resp:  [18] 18 (07/15 0423) BP: (154-183)/(70-93) 170/79 (07/15 0819) SpO2:  [93 %-99 %] 96 % (07/15 0819) Weight:  [151 lb 9.6 oz (68.8 kg)] 151 lb 9.6 oz (68.8 kg) (07/15 0427)  PHYSICAL EXAMINATION: General: Chronically ill appearing female, NAD, on RA Neuro: Alert and interactive, moving all ext to command HEENT: [place]/AT, PERRL, EOM-I and MMM Cardiovascular:  RRR, Nl S1/S2 and -M/R/G Lungs:  CTA bilaterally Abdomen: Soft, NT, ND and +BS Musculoskeletal:  No obvious deformities noted Skin:  Warm , dry and intact, no lesions or rash noted  Recent Labs  Lab 03/20/18 0534 03/21/18 0329 03/22/18 0453  NA 142 139 135  K 3.8 4.4 4.7  CL 102 99 97*  CO2 25 28 25   BUN 51* 26* 59*  CREATININE 6.41* 3.65* 6.35*  GLUCOSE 106* 97 161*   Recent Labs  Lab 03/20/18 0534 03/21/18 0329 03/22/18 0453  HGB  7.3* 7.7* 7.5*  HCT 23.1* 24.0* 23.6*  WBC 7.4 7.8 8.0  PLT 135* 112* 110*   Dg Chest 2 View  Result Date: 03/23/2018 CLINICAL DATA:  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[place]) J84.89 (ICD-10-CM) Acute respiratory failure with hypoxia ([place]) J96.01 (ICD-10-CM). EXAM: CHEST - 2 VIEW COMPARISON:  03/19/2018 FINDINGS: Significant improvement in diffuse bilateral airspace disease. Mild residual bilateral airspace disease. Small left effusion. Cardiac enlargement. Right jugular central venous catheter tip in the lower SVC unchanged. IMPRESSION: Significant improvement in diffuse bilateral airspace disease which may be due to clearing pneumonia or edema. Electronically Signed   By: Franchot Gallo M.D.   On: 03/23/2018 08:40    ASSESSMENT / PLAN:  Acute hypoxemic respiratory failure Worsening of Pulmonary Infiltrates ( BOOP) since discharge 6 days ago Missed all meds day prior to admission , had not started cyclosporin ( Mail ordered) Recent Strep Pneumonia No Hemoptysis this admission Pulmonary evidence of diffuse alveolar hemorrhage versus bronchiolitis obliterans with organizing pneumonia/cryptogenic organizing pneumonia last admission 02/12/2018. Cleared and has now recurred ? Compliance with prednisone ( Pill count showed only 1 dose taken)  Had not started Cyclosporin 2/2 being mail ordered ( Not available) Current every day smoker  62 year old female with a recent diagnosis of BOOP that was discharged and appears to have not taken her medications as ordered (steroids) and return to the hospital with hypoxemic respiratory failure.  PCCM was consulted.  On exam, CTA bilaterally.  I reviewed the CXR myself, pulmonary infiltrate noted.  Discussed with PCCM-NP.  BOOP:  - Decrease prednisone to 40 mg PO daily  - Taper slowly over a 6 months period  - F/U with PCCM as outpatient  Hypoxemia:  - Titrate O2 for sat of 88-92%, currently off  - No need for ambulatory desat study  at this point  HCAP:  - Abx per primary  - F/U on cultures  PCCM will sign off, please call back if needed.  Rush Farmer, M.D. [facility] Pulmonary/Critical Care Medicine. Pager: [phone]. After hours pager: [phone].

## 2018-03-24 NOTE — Care Management Note (Addendum)
Case Management Note  Patient Details  Name: Rhonda Lynch MRN: [id number] Date of Birth: 10-14-1955  Subjective/Objective:  Pt presented for increased troponin and shortness of breath. PTA from home with son and daughter. Plan will be to return home with assistance of family. Pt previously active with [facility] for [facility] PT. New orders for [facility] PT and referral sent to [facility] with DME 02 and [place]. DME to be delivered to room.                  Action/Plan: Pt in need of Cytoxan- CM did call the [facility] for medication assistance. MATCH previously utilized. CM did call Nita and she was able to help re-instate MATCH Letter to be used a the [facility]. Pt's cost will be $3.00. New Match Letter given. Pt is aware that hospital f/u will be tomorrow. Then she will go to [facility] to pick up [place] to order and have in 03-25-18. Pharmacy to then work with patient thereafter to make sure patient can get. CM did ask if SW at the [facility] can assist with [facility] vs [employer] and Medicaid.  No further needs from [facility] at this time.   Expected Discharge Date:                  Expected Discharge Plan:  [place]  In-House Referral:  Financial Counselor  Discharge planning Services  CM Consult, Follow-up appt scheduled, Medication Assistance, [facility], [facility] Program  Post Acute Care Choice:  Resumption of Svcs/PTA Provider, Home Health, Durable Medical Equipment Choice offered to:  Patient  DME Arranged:  [place] rolling with seat, Oxygen DME Agency:  [place]:  [place]:  [place]  Status of Service:  Completed, signed off  If discussed at [place] of Stay Meetings, dates discussed:    Additional Comments: 1615 03-25-18 Jacqlyn Krauss, RN,BSN [phone] CM received call from [facility] stating that [facility] letter that was sent will not cover the cost of the Drug. Flag came up and stated Drug Not Covered. CM did  contact Nita- no way to override the medication. CM did contact Elmyra Ricks with the [facility]- manager did approve for the medication to be ordered x 1. Patient can pick up medication on 03-26-18 after 12:00 pm. CM did call the patient to make her aware. CM also spoke with pt in regards to calling DSS to see if she qualifies for Medicaid.  No further needs from CM at this time.  Bethena Roys, RN 03/24/2018, 4:12 PM

## 2018-03-25 ENCOUNTER — Ambulatory Visit (INDEPENDENT_AMBULATORY_CARE_PROVIDER_SITE_OTHER): Payer: Medicaid Other | Admitting: Physician Assistant

## 2018-03-25 ENCOUNTER — Other Ambulatory Visit: Payer: Self-pay

## 2018-03-25 ENCOUNTER — Encounter (INDEPENDENT_AMBULATORY_CARE_PROVIDER_SITE_OTHER): Payer: Self-pay | Admitting: Physician Assistant

## 2018-03-25 VITALS — BP 169/80 | HR 67 | Temp 98.7°F | Ht 61.0 in | Wt 153.4 lb

## 2018-03-25 DIAGNOSIS — L989 Disorder of the skin and subcutaneous tissue, unspecified: Secondary | ICD-10-CM

## 2018-03-25 DIAGNOSIS — Z760 Encounter for issue of repeat prescription: Secondary | ICD-10-CM

## 2018-03-25 DIAGNOSIS — R739 Hyperglycemia, unspecified: Secondary | ICD-10-CM

## 2018-03-25 DIAGNOSIS — Z124 Encounter for screening for malignant neoplasm of cervix: Secondary | ICD-10-CM

## 2018-03-25 DIAGNOSIS — D631 Anemia in chronic kidney disease: Secondary | ICD-10-CM

## 2018-03-25 DIAGNOSIS — Z992 Dependence on renal dialysis: Secondary | ICD-10-CM

## 2018-03-25 DIAGNOSIS — N186 End stage renal disease: Secondary | ICD-10-CM | POA: Diagnosis not present

## 2018-03-25 DIAGNOSIS — J42 Unspecified chronic bronchitis: Secondary | ICD-10-CM | POA: Diagnosis not present

## 2018-03-25 DIAGNOSIS — Z09 Encounter for follow-up examination after completed treatment for conditions other than malignant neoplasm: Secondary | ICD-10-CM

## 2018-03-25 DIAGNOSIS — T50905A Adverse effect of unspecified drugs, medicaments and biological substances, initial encounter: Secondary | ICD-10-CM

## 2018-03-25 DIAGNOSIS — Z1231 Encounter for screening mammogram for malignant neoplasm of breast: Secondary | ICD-10-CM

## 2018-03-25 DIAGNOSIS — Z1239 Encounter for other screening for malignant neoplasm of breast: Secondary | ICD-10-CM

## 2018-03-25 DIAGNOSIS — Z1211 Encounter for screening for malignant neoplasm of colon: Secondary | ICD-10-CM

## 2018-03-25 MED ORDER — PREDNISONE 20 MG PO TABS: 40.0000 mg | ORAL_TABLET | Freq: Every day | ORAL | 3 refills | Status: DC

## 2018-03-25 MED ORDER — INSULIN GLARGINE 100 UNIT/ML SOLOSTAR PEN: 5.0000 [IU] | PEN_INJECTOR | Freq: Every day | SUBCUTANEOUS | 5 refills | Status: DC

## 2018-03-25 MED ORDER — INSULIN PEN NEEDLE 31G X 5 MM MISC
5 refills | Status: DC
Start: 2018-03-25 — End: 2018-04-13

## 2018-03-25 MED ORDER — CYCLOPHOSPHAMIDE 50 MG PO CAPS: 50.0000 mg | ORAL_CAPSULE | Freq: Every day | ORAL | 3 refills | Status: DC

## 2018-03-25 MED ORDER — THIAMINE HCL 100 MG PO TABS: 100.0000 mg | ORAL_TABLET | Freq: Every day | ORAL | 5 refills | Status: DC

## 2018-03-25 MED FILL — !LANTUS SOLOSTAR 100UNITS/M: 100 | 28 days supply | Qty: 3 | Fill #0

## 2018-03-25 MED FILL — predniSONE 20 MG TABS: 20 | 30 days supply | Qty: 60 | Fill #0

## 2018-03-25 NOTE — Progress Notes (Signed)
Subjective:  [name], female    DOB: 1955/09/24  Age: 62 y.o. MRN: [id number]  CC: hospital f/u   HPI Rhonda Lynch is a 62 y.o. female with a medical history of cocaine abuse, ETOH abuse, PNA, and ESRD presents as a new patient on hospital f/u. Admitted 03/19/18 and discharged 03/24/18. Diagnosed with bronchiolitis obliterans with organizing pneumonia, ARF w/hypoxia, pulmonary vasculitis, acute pulmonary edema, ESRD, anemia of chronic disease, alcohol abuse, and cocaine abuse. However UDS negative. Pt had not been taking her previously prescribed steroids and cytoxan possibly due to access and/or affordability issues. Cytoxan 50 mg and Prednisone 40 mg prescribed to patient. Advised to f/u with nephrology and pulmonology. Pt is uninsured. Has not started CAFA process. States she feels well since leaving the hospital yesterday. Does not have any complaints or symptoms whatsoever.       Outpatient Medications Prior to Visit  Medication Sig Dispense Refill  . cyclophosphamide (CYTOXAN) 50 MG capsule Take 1 capsule (50 mg total) by mouth daily. Give on an empty stomach 1 hour before or 2 hours after meals. 30 capsule 0  . famotidine (PEPCID) 20 MG tablet Take 1 tablet (20 mg total) by mouth daily. 30 tablet 0  . Insulin Glargine (LANTUS) 100 UNIT/ML Solostar Pen Inject 5 Units into the skin daily. (Patient taking differently: Inject 5 Units into the skin at bedtime. ) 15 mL 1  . Insulin Pen Needle 31G X 5 MM MISC Use for once daily injections 30 each 0  . Multiple Vitamin (MULTIVITAMIN WITH MINERALS) TABS tablet Take 1 tablet by mouth daily. 30 tablet 0  . predniSONE (DELTASONE) 20 MG tablet Take 2 tablets (40 mg total) by mouth daily with breakfast. 60 tablet 0  . sulfamethoxazole-trimethoprim (BACTRIM DS,SEPTRA DS) 800-160 MG tablet Take 1 tablet by mouth 3 (three) times a week. (Patient taking differently: Take 1 tablet by mouth every Monday, Wednesday, and Friday. ) 12  tablet 0  . thiamine 100 MG tablet Take 1 tablet (100 mg total) by mouth daily. 30 tablet 0  . traMADol (ULTRAM) 50 MG tablet Take 1 tablet (50 mg total) by mouth every 12 (twelve) hours as needed (refractory pain). 10 tablet 0  . acetaminophen (TYLENOL) 325 MG tablet Take 325-650 mg by mouth every 6 (six) hours as needed (for pain or headaches).    . Darbepoetin Alfa (ARANESP) 40 MCG/0.4ML SOSY injection Inject 0.4 mLs (40 mcg total) into the vein every Thursday with hemodialysis. (Patient not taking: Reported on 03/25/2018) 8.4 mL    No facility-administered medications prior to visit.      ROS Review of Systems  Constitutional: Negative for chills, fever and malaise/fatigue.  Eyes: Negative for blurred vision.  Respiratory: Negative for shortness of breath.   Cardiovascular: Negative for chest pain and palpitations.  Gastrointestinal: Negative for abdominal pain and nausea.  Genitourinary: Negative for dysuria and hematuria.  Musculoskeletal: Negative for joint pain and myalgias.  Skin: Negative for rash.  Neurological: Negative for tingling and headaches.  Psychiatric/Behavioral: Negative for depression. The patient is not nervous/anxious.     Objective:  BP (!) 169/80 (BP Location: Right Arm, Patient Position: Sitting, Cuff Size: Normal)   Pulse 67   Temp 98.7 F (37.1 C) (Oral)   Ht 5\' 1"  (1.549 m)   Wt 153 lb 6.4 oz (69.6 kg)   SpO2 97%   BMI 28.98 kg/m   BP/Weight 03/25/2018 7/51/7001 03/13/9448  Systolic BP 675 916 384  Diastolic  BP 80 89 87  Wt. (Lbs) 153.4 151.6 160.5  BMI 28.98 28.64 30.33      Physical Exam  Constitutional: She is oriented to person, place, and time.  Well developed, obese, NAD, polite  HENT:  Head: Normocephalic and atraumatic.  Eyes: Conjunctivae are normal. No scleral icterus.  Neck: Normal range of motion. Neck supple. No thyromegaly present.  Cardiovascular: Normal rate, regular rhythm and normal heart sounds. Exam reveals no gallop  and no friction rub.  No murmur heard. No Lower extremity edema bilaterally  Pulmonary/Chest: Effort normal and breath sounds normal. No stridor. No respiratory distress. She has no wheezes. She has no rales.  Abdominal: Soft. Bowel sounds are normal. She exhibits no distension. There is no tenderness.  Musculoskeletal: She exhibits no edema.  Neurological: She is alert and oriented to person, place, and time.  Skin: Skin is warm and dry. No rash noted. No erythema. No pallor.  Large hyperpigmented patch with a lichenoid texture on the medial aspect of LLE.   Psychiatric: She has a normal mood and affect. Her behavior is normal. Thought content normal.  Vitals reviewed.    Assessment & Plan:    1. ESRD on dialysis ([place]) - Refill cyclophosphamide (CYTOXAN) 50 MG capsule; Take 1 capsule (50 mg total) by mouth daily. Give on an empty stomach 1 hour before or 2 hours after meals.  Dispense: 30 capsule; Refill: 3 - Basic Metabolic Panel - Awaiting CAFA before pt can be referred to specialist. Pt does not have the financial means for consult and treatment at this time.   2. Bronchiolitis obliterans (HCC) - Refill predniSONE (DELTASONE) 20 MG tablet; Take 2 tablets (40 mg total) by mouth daily with breakfast.  Dispense: 60 tablet; Refill: 3 - Awaiting CAFA before pt can be referred to specialist. Pt does not have the financial means for consult and treatment at this time.   3. Anemia due to chronic kidney disease, on chronic dialysis (HCC) - CBC with Differential  4. Hospital discharge follow-up - CBC with Differential  5. Medication refill - thiamine 100 MG tablet; Take 1 tablet (100 mg total) by mouth daily.  Dispense: 30 tablet; Refill: 5  6. Screening for colon cancer - Fecal occult blood, imunochemical  7. Screening for breast cancer - I have messaged Ms. Jonna Clark to reach out to pt to have her enrolled in the [facility] program for free breast cancer screening.  8. Screening  for cervical cancer - I have messaged Ms. Jonna Clark to reach out to pt to have her enrolled in the [facility] program for free cervical cancer screening.   9. Hyperglycemia, drug-induced - Insulin Pen Needle 31G X 5 MM MISC; Use for once daily injections  Dispense: 30 each; Refill: 5 - Insulin Glargine (LANTUS) 100 UNIT/ML Solostar Pen; Inject 5 Units into the skin at bedtime.  Dispense: 3 mL; Refill: 5  10. Skin lesion of left leg - Awaiting CAFA before pt can be referred to specialist. Pt does not have the financial means for consult and treatment at this time.      Meds ordered this encounter  Medications  . thiamine 100 MG tablet    Sig: Take 1 tablet (100 mg total) by mouth daily.    Dispense:  30 tablet    Refill:  5    Order Specific Question:   Supervising Provider    Answer:   Charlott Rakes [[id number]]  . predniSONE (DELTASONE) 20 MG tablet    Sig:  Take 2 tablets (40 mg total) by mouth daily with breakfast.    Dispense:  60 tablet    Refill:  3    Order Specific Question:   Supervising Provider    Answer:   Charlott Rakes [[id number]]  . Insulin Pen Needle 31G X 5 MM MISC    Sig: Use for once daily injections    Dispense:  30 each    Refill:  5    ICD10 -  R73.9    Order Specific Question:   Supervising Provider    Answer:   Charlott Rakes [[id number]]  . Insulin Glargine (LANTUS) 100 UNIT/ML Solostar Pen    Sig: Inject 5 Units into the skin at bedtime.    Dispense:  3 mL    Refill:  5    ICD10 - R73.9    Order Specific Question:   Supervising Provider    Answer:   Charlott Rakes [[id number]]  . cyclophosphamide (CYTOXAN) 50 MG capsule    Sig: Take 1 capsule (50 mg total) by mouth daily. Give on an empty stomach 1 hour before or 2 hours after meals.    Dispense:  30 capsule    Refill:  3    Order Specific Question:   Supervising Provider    Answer:   Charlott Rakes [[id number]]    Follow-up: Return in about 1 month (around 04/22/2018) for f/u CAFA process and need for referrals.    Clent Demark PA

## 2018-03-25 NOTE — Patient Instructions (Signed)
Obliterative Bronchiolitis Obliterative bronchiolitis is a rare lung disease that causes the small airways of your lungs to harden and scar. It often affects people who are regularly exposed to fumes that irritate the lungs. As the disease progresses, it causes a cough and shortness of breath that gets worse over time. Eventually, it may become hard to breathe. What are the causes? Causes may include:  Exposure to toxic fumes such as: ? Chlorine. ? Ammonia. ? Nitrogen oxide. ? Sulfur dioxide. ? Diacetyl.  Viral respiratory infections.  Connective tissue disorders, such as rheumatoid arthritis or lupus.  Hormone-producing lung diseases, such as neuroendocrine cell hyperplasia.  Reaction to medicines.  Bone marrow, lung, or heart-lung transplants.  High levels of air pollution.  Long-lasting heartburn (chronic esophageal reflux).  Sometimes the cause of obliterative bronchiolitis is not known (idiopathic). What increases the risk? You may have a higher risk of obliterative bronchiolitis if you are exposed to toxic fumes from:  Chemical weapons used during warfare.  Fertilizer production.  Microwave popcorn production.  Coffee roasting.  Smoke from uncontrolled fire.  Research scientist (physical sciences).  What are the signs or symptoms? Signs and symptoms of obliterative bronchiolitis may get gradually worse over time. These include:  Shortness of breath.  Dry cough.  Wheezing.  Fever.  Night sweats.  Weight loss.  How is this diagnosed? Your health care provider may suspect obliterative bronchiolitis from your signs and symptoms, especially if you have a history of exposure to toxic fumes. Your health care provider will also do a physical exam. This may include tests to help make the diagnosis such as taking a small piece of lung tissue to look at under a microscope (biopsy). A biopsy of lung tissue is the only sure way to diagnose this condition. You may also have tests  to check lung function. These may include:  Breathing (pulmonary function) tests.  Chest X-ray.  CT scan.  How is this treated? There is no cure for obliterative bronchiolitis. Treatment aims to improve your symptoms, help you breathe better, and prevent additional damage. You may need lung transplant surgery if you have a severe case of obliterative bronchiolitis. Other possible treatments include:  Nutrition therapy to gain weight, if you are underweight.  Pulmonary rehabilitation. This may involve working with a team of specialists, such as respiratory, occupational, and physical therapists.  Medicines. This may include: ? Bronchodilators to open airways. ? Inhaled steroids to decrease lung inflammation. ? Oxygen to support breathing. ? Medicines that suppress your body's defense system (immune system) if you had a lung or stem cell transplant.  Follow these instructions at home:  Take medicines only as directed by your health care provider.  Keep your vaccines current as directed by your health care provider.  Drink enough fluid to keep your urine clear or pale yellow.  Use oxygen therapy and pulmonary rehabilitation as directed by your health care provider.  Do not use any tobacco products, including cigarettes, chewing tobacco, or electronic cigarettes. If you need help quitting, ask your health care provider.  Keep all follow-up visits as directed by your health care provider. This is important. Contact a health care provider if:  You have a fever.  You have shortness of breath that is getting worse.  You are unable to maintain your weight.  You are unable to quit smoking. Get help right away if:  You are struggling to breathe.  You have chest pain. This information is not intended to replace advice given to you by  your health care provider. Make sure you discuss any questions you have with your health care provider. Document Released: 07/26/2004 Document  Revised: 02/02/2016 Document Reviewed: 05/05/2014 Elsevier Interactive Patient Education  [employer].

## 2018-03-26 ENCOUNTER — Other Ambulatory Visit (INDEPENDENT_AMBULATORY_CARE_PROVIDER_SITE_OTHER): Payer: Self-pay | Admitting: Physician Assistant

## 2018-03-26 ENCOUNTER — Telehealth (INDEPENDENT_AMBULATORY_CARE_PROVIDER_SITE_OTHER): Payer: Self-pay

## 2018-03-26 DIAGNOSIS — D72829 Elevated white blood cell count, unspecified: Secondary | ICD-10-CM

## 2018-03-26 LAB — BASIC METABOLIC PANEL
BUN / CREAT RATIO: 13 (ref 12–28)
BUN: 96 mg/dL — AB (ref 8–27)
CO2: 18 mmol/L — ABNORMAL LOW (ref 20–29)
CREATININE: 7.33 mg/dL — AB (ref 0.57–1.00)
Calcium: 8.7 mg/dL (ref 8.7–10.3)
Chloride: 95 mmol/L — ABNORMAL LOW (ref 96–106)
GFR calc Af Amer: 6 mL/min/{1.73_m2} — ABNORMAL LOW (ref >59)
GFR, EST NON AFRICAN AMERICAN: 5 mL/min/{1.73_m2} — AB (ref >59)
GLUCOSE: 92 mg/dL (ref 65–99)
Potassium: 5.3 mmol/L — ABNORMAL HIGH (ref 3.5–5.2)
SODIUM: 135 mmol/L (ref 134–144)

## 2018-03-26 LAB — CBC WITH DIFFERENTIAL/PLATELET
BASOS ABS: 0 10*3/uL (ref 0.0–0.2)
BASOS: 0 %
EOS (ABSOLUTE): 0 10*3/uL (ref 0.0–0.4)
EOS: 0 %
HEMATOCRIT: 26.8 % — AB (ref 34.0–46.6)
HEMOGLOBIN: 8.9 g/dL — AB (ref 11.1–15.9)
IMMATURE GRANS (ABS): 0.1 10*3/uL (ref 0.0–0.1)
Immature Granulocytes: 1 %
LYMPHS ABS: 1 10*3/uL (ref 0.7–3.1)
LYMPHS: 8 %
MCH: 28.1 pg (ref 26.6–33.0)
MCHC: 33.2 g/dL (ref 31.5–35.7)
MCV: 85 fL (ref 79–97)
MONOCYTES: 3 %
Monocytes Absolute: 0.4 10*3/uL (ref 0.1–0.9)
NEUTROS ABS: 11.5 10*3/uL — AB (ref 1.4–7.0)
Neutrophils: 88 %
Platelets: 143 10*3/uL — ABNORMAL LOW (ref 150–450)
RBC: 3.17 x10E6/uL — ABNORMAL LOW (ref 3.77–5.28)
RDW: 19.3 % — ABNORMAL HIGH (ref 12.3–15.4)
WBC: 13 10*3/uL — ABNORMAL HIGH (ref 3.4–10.8)

## 2018-03-26 MED FILL — traMADol HCL 50 MG TABS: 50 | 5 days supply | Qty: 10 | Fill #0

## 2018-03-26 MED FILL — CYCLOPHOSPHAMIDE 50 MG CAP: 50 | 30 days supply | Qty: 30 | Fill #0

## 2018-03-26 NOTE — Telephone Encounter (Signed)
Patient was not home. Results provided to her son. He will bring patient in tomorrow for redraw. Nat Christen, CMA

## 2018-03-26 NOTE — Telephone Encounter (Signed)
-----   Message from Clent Demark, PA-C sent at 03/26/2018  9:33 AM EDT ----- Dema Severin blood cells elevated, will need to redraw CBC tomorrow. I am concerned she may still be having respiratory issues or infection. Anemia of chronic disease but result is better than previous.

## 2018-03-27 ENCOUNTER — Other Ambulatory Visit (INDEPENDENT_AMBULATORY_CARE_PROVIDER_SITE_OTHER): Payer: Self-pay

## 2018-03-27 DIAGNOSIS — D72829 Elevated white blood cell count, unspecified: Secondary | ICD-10-CM

## 2018-03-27 NOTE — Progress Notes (Signed)
Labs drawn by on site [facility] phlebotomist CD. Epic not on there computer so patient was placed on the [facility] lab schedule. Nat Christen, CMA

## 2018-03-28 ENCOUNTER — Telehealth (INDEPENDENT_AMBULATORY_CARE_PROVIDER_SITE_OTHER): Payer: Self-pay

## 2018-03-28 LAB — CBC WITH DIFFERENTIAL/PLATELET
BASOS ABS: 0 10*3/uL (ref 0.0–0.2)
Basos: 0 %
EOS (ABSOLUTE): 0 10*3/uL (ref 0.0–0.4)
Eos: 0 %
Hematocrit: 25.3 % — ABNORMAL LOW (ref 34.0–46.6)
Hemoglobin: 8 g/dL — ABNORMAL LOW (ref 11.1–15.9)
IMMATURE GRANS (ABS): 0 10*3/uL (ref 0.0–0.1)
Immature Granulocytes: 1 %
LYMPHS: 6 %
Lymphocytes Absolute: 0.5 10*3/uL — ABNORMAL LOW (ref 0.7–3.1)
MCH: 26.2 pg — AB (ref 26.6–33.0)
MCHC: 31.6 g/dL (ref 31.5–35.7)
MCV: 83 fL (ref 79–97)
MONOCYTES: 3 %
MONOS ABS: 0.2 10*3/uL (ref 0.1–0.9)
NEUTROS PCT: 90 %
Neutrophils Absolute: 7.3 10*3/uL — ABNORMAL HIGH (ref 1.4–7.0)
PLATELETS: 126 10*3/uL — AB (ref 150–450)
RBC: 3.05 x10E6/uL — ABNORMAL LOW (ref 3.77–5.28)
RDW: 19.9 % — ABNORMAL HIGH (ref 12.3–15.4)
WBC: 8.1 10*3/uL (ref 3.4–10.8)

## 2018-03-28 NOTE — Telephone Encounter (Signed)
Patient is aware that anemia is stable and WBC are back to normal. Nat Christen, CMA

## 2018-03-28 NOTE — Telephone Encounter (Signed)
-----   Message from Clent Demark, PA-C sent at 03/28/2018  8:34 AM EDT ----- Anemia stable. WBCs back to normal.

## 2018-04-02 ENCOUNTER — Ambulatory Visit: Payer: Self-pay | Admitting: Emergency Medicine

## 2018-04-02 ENCOUNTER — Encounter (HOSPITAL_COMMUNITY): Payer: Self-pay

## 2018-04-02 ENCOUNTER — Emergency Department (HOSPITAL_COMMUNITY): Payer: Medicaid Other

## 2018-04-02 ENCOUNTER — Other Ambulatory Visit: Payer: Self-pay

## 2018-04-02 ENCOUNTER — Telehealth: Payer: Self-pay | Admitting: Emergency Medicine

## 2018-04-02 ENCOUNTER — Inpatient Hospital Stay (HOSPITAL_COMMUNITY)
Admission: EM | Admit: 2018-04-02 | Discharge: 2018-04-04 | DRG: 189 | Disposition: A | Discharge status: Home Health | Payer: Medicaid Other | Attending: Internal Medicine | Admitting: Internal Medicine

## 2018-04-02 DIAGNOSIS — I120 Hypertensive chronic kidney disease with stage 5 chronic kidney disease or end stage renal disease: Secondary | ICD-10-CM | POA: Diagnosis present

## 2018-04-02 DIAGNOSIS — D649 Anemia, unspecified: Secondary | ICD-10-CM | POA: Diagnosis present

## 2018-04-02 DIAGNOSIS — J810 Acute pulmonary edema: Secondary | ICD-10-CM | POA: Diagnosis not present

## 2018-04-02 DIAGNOSIS — E119 Type 2 diabetes mellitus without complications: Secondary | ICD-10-CM

## 2018-04-02 DIAGNOSIS — Z9049 Acquired absence of other specified parts of digestive tract: Secondary | ICD-10-CM

## 2018-04-02 DIAGNOSIS — Z8701 Personal history of pneumonia (recurrent): Secondary | ICD-10-CM

## 2018-04-02 DIAGNOSIS — E1122 Type 2 diabetes mellitus with diabetic chronic kidney disease: Secondary | ICD-10-CM | POA: Diagnosis present

## 2018-04-02 DIAGNOSIS — Z7952 Long term (current) use of systemic steroids: Secondary | ICD-10-CM

## 2018-04-02 DIAGNOSIS — R778 Other specified abnormalities of plasma proteins: Secondary | ICD-10-CM | POA: Diagnosis present

## 2018-04-02 DIAGNOSIS — N189 Chronic kidney disease, unspecified: Secondary | ICD-10-CM

## 2018-04-02 DIAGNOSIS — F1410 Cocaine abuse, uncomplicated: Secondary | ICD-10-CM | POA: Diagnosis present

## 2018-04-02 DIAGNOSIS — N186 End stage renal disease: Secondary | ICD-10-CM | POA: Diagnosis present

## 2018-04-02 DIAGNOSIS — J8489 Other specified interstitial pulmonary diseases: Secondary | ICD-10-CM | POA: Diagnosis present

## 2018-04-02 DIAGNOSIS — J9600 Acute respiratory failure, unspecified whether with hypoxia or hypercapnia: Secondary | ICD-10-CM

## 2018-04-02 DIAGNOSIS — Z90710 Acquired absence of both cervix and uterus: Secondary | ICD-10-CM

## 2018-04-02 DIAGNOSIS — E8770 Fluid overload, unspecified: Secondary | ICD-10-CM | POA: Diagnosis present

## 2018-04-02 DIAGNOSIS — Z794 Long term (current) use of insulin: Secondary | ICD-10-CM

## 2018-04-02 DIAGNOSIS — Z9119 Patient's noncompliance with other medical treatment and regimen: Secondary | ICD-10-CM | POA: Insufficient documentation

## 2018-04-02 DIAGNOSIS — B1920 Unspecified viral hepatitis C without hepatic coma: Secondary | ICD-10-CM | POA: Diagnosis present

## 2018-04-02 DIAGNOSIS — R0902 Hypoxemia: Secondary | ICD-10-CM

## 2018-04-02 DIAGNOSIS — Z91199 Patient's noncompliance with other medical treatment and regimen due to unspecified reason: Secondary | ICD-10-CM | POA: Insufficient documentation

## 2018-04-02 DIAGNOSIS — D696 Thrombocytopenia, unspecified: Secondary | ICD-10-CM | POA: Diagnosis present

## 2018-04-02 DIAGNOSIS — Z992 Dependence on renal dialysis: Secondary | ICD-10-CM

## 2018-04-02 DIAGNOSIS — E118 Type 2 diabetes mellitus with unspecified complications: Secondary | ICD-10-CM

## 2018-04-02 DIAGNOSIS — Z9115 Patient's noncompliance with renal dialysis: Secondary | ICD-10-CM

## 2018-04-02 DIAGNOSIS — J9621 Acute and chronic respiratory failure with hypoxia: Secondary | ICD-10-CM | POA: Diagnosis present

## 2018-04-02 DIAGNOSIS — Z978 Presence of other specified devices: Secondary | ICD-10-CM | POA: Diagnosis present

## 2018-04-02 DIAGNOSIS — F1010 Alcohol abuse, uncomplicated: Secondary | ICD-10-CM | POA: Diagnosis present

## 2018-04-02 DIAGNOSIS — I10 Essential (primary) hypertension: Secondary | ICD-10-CM | POA: Insufficient documentation

## 2018-04-02 DIAGNOSIS — Z9981 Dependence on supplemental oxygen: Secondary | ICD-10-CM

## 2018-04-02 DIAGNOSIS — J9601 Acute respiratory failure with hypoxia: Secondary | ICD-10-CM | POA: Diagnosis not present

## 2018-04-02 DIAGNOSIS — I4891 Unspecified atrial fibrillation: Secondary | ICD-10-CM

## 2018-04-02 DIAGNOSIS — I471 Supraventricular tachycardia: Secondary | ICD-10-CM | POA: Diagnosis present

## 2018-04-02 DIAGNOSIS — R7989 Other specified abnormal findings of blood chemistry: Secondary | ICD-10-CM | POA: Diagnosis present

## 2018-04-02 DIAGNOSIS — F17210 Nicotine dependence, cigarettes, uncomplicated: Secondary | ICD-10-CM | POA: Diagnosis present

## 2018-04-02 HISTORY — DX: Essential (primary) hypertension: I10

## 2018-04-02 LAB — CBC
HCT: 28.9 % — ABNORMAL LOW (ref 36.0–46.0)
HEMATOCRIT: 28.3 % — AB (ref 36.0–46.0)
HEMOGLOBIN: 8.8 g/dL — AB (ref 12.0–15.0)
Hemoglobin: 9 g/dL — ABNORMAL LOW (ref 12.0–15.0)
MCH: 26.9 pg (ref 26.0–34.0)
MCH: 26.4 pg (ref 26.0–34.0)
MCHC: 31.1 g/dL (ref 30.0–36.0)
MCHC: 31.1 g/dL (ref 30.0–36.0)
MCV: 86.5 fL (ref 78.0–100.0)
MCV: 84.8 fL (ref 78.0–100.0)
Platelets: 132 10*3/uL — ABNORMAL LOW (ref 150–400)
Platelets: 122 10*3/uL — ABNORMAL LOW (ref 150–400)
RBC: 3.27 MIL/uL — ABNORMAL LOW (ref 3.87–5.11)
RBC: 3.41 MIL/uL — ABNORMAL LOW (ref 3.87–5.11)
RDW: 19.8 % — ABNORMAL HIGH (ref 11.5–15.5)
RDW: 19.7 % — AB (ref 11.5–15.5)
WBC: 11 10*3/uL — ABNORMAL HIGH (ref 4.0–10.5)
WBC: 10.1 10*3/uL (ref 4.0–10.5)

## 2018-04-02 LAB — BASIC METABOLIC PANEL
Anion gap: 18 — ABNORMAL HIGH (ref 5–15)
BUN: 29 mg/dL — ABNORMAL HIGH (ref 8–23)
CALCIUM: 8.2 mg/dL — AB (ref 8.9–10.3)
CO2: 22 mmol/L (ref 22–32)
CREATININE: 3.88 mg/dL — AB (ref 0.44–1.00)
Chloride: 101 mmol/L (ref 98–111)
GFR calc non Af Amer: 11 mL/min — ABNORMAL LOW (ref >60)
GFR, EST AFRICAN AMERICAN: 13 mL/min — AB (ref >60)
Glucose, Bld: 208 mg/dL — ABNORMAL HIGH (ref 70–99)
Potassium: 3.2 mmol/L — ABNORMAL LOW (ref 3.5–5.1)
SODIUM: 141 mmol/L (ref 135–145)

## 2018-04-02 LAB — URINALYSIS, ROUTINE W REFLEX MICROSCOPIC
Bilirubin Urine: NEGATIVE
Glucose, UA: 150 mg/dL — AB
Ketones, ur: NEGATIVE mg/dL
Nitrite: NEGATIVE
Protein, ur: >=300 mg/dL — AB
RBC / HPF: >50 RBC/hpf — ABNORMAL HIGH (ref 0–5)
Specific Gravity, Urine: 1.015 (ref 1.005–1.030)
pH: 6 (ref 5.0–8.0)

## 2018-04-02 LAB — TROPONIN I
TROPONIN I: 0.2 ng/mL — AB (ref <0.03)
TROPONIN I: 0.19 ng/mL — AB (ref <0.03)
Troponin I: 0.24 ng/mL (ref <0.03)

## 2018-04-02 LAB — COMPREHENSIVE METABOLIC PANEL
ALT: 28 U/L (ref 0–44)
ANION GAP: 17 — AB (ref 5–15)
AST: 25 U/L (ref 15–41)
Albumin: 3.1 g/dL — ABNORMAL LOW (ref 3.5–5.0)
Alkaline Phosphatase: 69 U/L (ref 38–126)
BILIRUBIN TOTAL: 1 mg/dL (ref 0.3–1.2)
BUN: 68 mg/dL — ABNORMAL HIGH (ref 8–23)
CALCIUM: 8 mg/dL — AB (ref 8.9–10.3)
CO2: 21 mmol/L — ABNORMAL LOW (ref 22–32)
Chloride: 105 mmol/L (ref 98–111)
Creatinine, Ser: 7.37 mg/dL — ABNORMAL HIGH (ref 0.44–1.00)
GFR, EST AFRICAN AMERICAN: 6 mL/min — AB (ref >60)
GFR, EST NON AFRICAN AMERICAN: 5 mL/min — AB (ref >60)
GLUCOSE: 315 mg/dL — AB (ref 70–99)
Potassium: 4.4 mmol/L (ref 3.5–5.1)
Sodium: 143 mmol/L (ref 135–145)
TOTAL PROTEIN: 5.6 g/dL — AB (ref 6.5–8.1)

## 2018-04-02 LAB — GLUCOSE, CAPILLARY: GLUCOSE-CAPILLARY: 196 mg/dL — AB (ref 70–99)

## 2018-04-02 LAB — RAPID URINE DRUG SCREEN, HOSP PERFORMED
AMPHETAMINES: NOT DETECTED
BARBITURATES: NOT DETECTED
Benzodiazepines: NOT DETECTED
Cocaine: POSITIVE — AB
Opiates: NOT DETECTED
Tetrahydrocannabinol: NOT DETECTED

## 2018-04-02 LAB — CBG MONITORING, ED
GLUCOSE-CAPILLARY: 285 mg/dL — AB (ref 70–99)
GLUCOSE-CAPILLARY: 297 mg/dL — AB (ref 70–99)

## 2018-04-02 LAB — PHOSPHORUS: PHOSPHORUS: 4.3 mg/dL (ref 2.5–4.6)

## 2018-04-02 LAB — HEMOGLOBIN A1C
Hgb A1c MFr Bld: 6.8 % — ABNORMAL HIGH (ref 4.8–5.6)
MEAN PLASMA GLUCOSE: 148.46 mg/dL

## 2018-04-02 MED ORDER — NITROGLYCERIN 2 % TD OINT
1.0000 [in_us] | TOPICAL_OINTMENT | Freq: Four times a day (QID) | TRANSDERMAL | Status: DC
  Administered 2018-04-03 (×2): 1 [in_us] via TOPICAL
  Filled 2018-04-02: qty 30

## 2018-04-02 MED ORDER — HYDROMORPHONE HCL 1 MG/ML IJ SOLN
0.5000 mg | Freq: Once | INTRAMUSCULAR | Status: AC
  Administered 2018-04-02: 0.5 mg via INTRAVENOUS

## 2018-04-02 MED ORDER — PREDNISONE 20 MG PO TABS
40.0000 mg | ORAL_TABLET | Freq: Every day | ORAL | Status: DC
  Administered 2018-04-03 – 2018-04-04 (×2): 40 mg via ORAL
  Filled 2018-04-02 (×3): qty 2

## 2018-04-02 MED ORDER — ONDANSETRON HCL 4 MG/2ML IJ SOLN: 4.0000 mg | Freq: Four times a day (QID) | INTRAMUSCULAR | Status: DC | PRN

## 2018-04-02 MED ORDER — LIDOCAINE-PRILOCAINE 2.5-2.5 % EX CREA
1.0000 "application " | TOPICAL_CREAM | CUTANEOUS | Status: DC | PRN
  Filled 2018-04-02: qty 5

## 2018-04-02 MED ORDER — SODIUM CHLORIDE 0.9 % IV SOLN: 250.0000 mL | INTRAVENOUS | Status: DC | PRN

## 2018-04-02 MED ORDER — BISACODYL 5 MG PO TBEC: 5.0000 mg | DELAYED_RELEASE_TABLET | Freq: Every day | ORAL | Status: DC | PRN

## 2018-04-02 MED ORDER — HEPARIN SODIUM (PORCINE) 5000 UNIT/ML IJ SOLN
5000.0000 [IU] | Freq: Three times a day (TID) | INTRAMUSCULAR | Status: DC
  Administered 2018-04-03 (×2): 5000 [IU] via SUBCUTANEOUS
  Filled 2018-04-02: qty 1

## 2018-04-02 MED ORDER — INSULIN ASPART 100 UNIT/ML ~~LOC~~ SOLN
0.0000 [IU] | Freq: Three times a day (TID) | SUBCUTANEOUS | Status: DC
  Administered 2018-04-03: 2 [IU] via SUBCUTANEOUS
  Administered 2018-04-03 (×2): 3 [IU] via SUBCUTANEOUS

## 2018-04-02 MED ORDER — ALBUTEROL SULFATE (2.5 MG/3ML) 0.083% IN NEBU
2.5000 mg | INHALATION_SOLUTION | Freq: Four times a day (QID) | RESPIRATORY_TRACT | Status: DC
  Administered 2018-04-02 – 2018-04-03 (×3): 2.5 mg via RESPIRATORY_TRACT
  Filled 2018-04-02 (×2): qty 3

## 2018-04-02 MED ORDER — SODIUM CHLORIDE 0.9% FLUSH: 3.0000 mL | INTRAVENOUS | Status: DC | PRN

## 2018-04-02 MED ORDER — NITROGLYCERIN 2 % TD OINT
1.0000 [in_us] | TOPICAL_OINTMENT | Freq: Four times a day (QID) | TRANSDERMAL | Status: DC
  Administered 2018-04-02: 1 [in_us] via TOPICAL
  Filled 2018-04-02: qty 1

## 2018-04-02 MED ORDER — ACETAMINOPHEN 325 MG PO TABS: 650.0000 mg | ORAL_TABLET | Freq: Four times a day (QID) | ORAL | Status: DC | PRN

## 2018-04-02 MED ORDER — SODIUM CHLORIDE 0.9 % IV SOLN
100.0000 mL | INTRAVENOUS | Status: DC | PRN
Start: 2018-04-02 — End: 2018-04-04

## 2018-04-02 MED ORDER — SULFAMETHOXAZOLE-TRIMETHOPRIM 800-160 MG PO TABS
1.0000 | ORAL_TABLET | ORAL | Status: DC
  Administered 2018-04-03: 1 via ORAL
  Filled 2018-04-02: qty 1

## 2018-04-02 MED ORDER — INSULIN ASPART 100 UNIT/ML ~~LOC~~ SOLN
0.0000 [IU] | Freq: Every day | SUBCUTANEOUS | Status: DC
  Administered 2018-04-03: 3 [IU] via SUBCUTANEOUS

## 2018-04-02 MED ORDER — TRAZODONE HCL 50 MG PO TABS: 25.0000 mg | ORAL_TABLET | Freq: Every evening | ORAL | Status: DC | PRN

## 2018-04-02 MED ORDER — PENTAFLUOROPROP-TETRAFLUOROETH EX AERO: 1.0000 "application " | INHALATION_SPRAY | CUTANEOUS | Status: DC | PRN

## 2018-04-02 MED ORDER — ONDANSETRON HCL 4 MG/2ML IJ SOLN
4.0000 mg | Freq: Once | INTRAMUSCULAR | Status: AC
  Administered 2018-04-02: 4 mg via INTRAVENOUS
  Filled 2018-04-02: qty 2

## 2018-04-02 MED ORDER — ONDANSETRON HCL 4 MG PO TABS: 4.0000 mg | ORAL_TABLET | Freq: Four times a day (QID) | ORAL | Status: DC | PRN

## 2018-04-02 MED ORDER — CHLORHEXIDINE GLUCONATE CLOTH 2 % EX PADS
6.0000 | MEDICATED_PAD | Freq: Every day | CUTANEOUS | Status: DC
  Administered 2018-04-03 – 2018-04-04 (×2): 6 via TOPICAL

## 2018-04-02 MED ORDER — HYDROMORPHONE HCL 1 MG/ML IJ SOLN
INTRAMUSCULAR | Status: AC
  Administered 2018-04-02: 0.5 mg via INTRAVENOUS
  Filled 2018-04-02: qty 0.5

## 2018-04-02 MED ORDER — SODIUM CHLORIDE 0.9% FLUSH
3.0000 mL | Freq: Two times a day (BID) | INTRAVENOUS | Status: DC
  Administered 2018-04-03 – 2018-04-04 (×3): 3 mL via INTRAVENOUS

## 2018-04-02 MED ORDER — ACETAMINOPHEN 650 MG RE SUPP: 650.0000 mg | Freq: Four times a day (QID) | RECTAL | Status: DC | PRN

## 2018-04-02 MED ORDER — NITROGLYCERIN 0.4 MG SL SUBL: 0.4000 mg | SUBLINGUAL_TABLET | SUBLINGUAL | Status: DC | PRN

## 2018-04-02 MED ORDER — HEPARIN SODIUM (PORCINE) 1000 UNIT/ML DIALYSIS
2000.0000 [IU] | Freq: Once | INTRAMUSCULAR | Status: AC
  Administered 2018-04-02: 2000 [IU] via INTRAVENOUS_CENTRAL

## 2018-04-02 MED ORDER — INSULIN GLARGINE 100 UNIT/ML ~~LOC~~ SOLN
5.0000 [IU] | Freq: Every day | SUBCUTANEOUS | Status: DC
  Administered 2018-04-03 – 2018-04-04 (×3): 5 [IU] via SUBCUTANEOUS
  Filled 2018-04-02 (×3): qty 0.05

## 2018-04-02 MED ORDER — ALBUTEROL SULFATE (2.5 MG/3ML) 0.083% IN NEBU: 2.5000 mg | INHALATION_SOLUTION | RESPIRATORY_TRACT | Status: DC | PRN

## 2018-04-02 NOTE — Consult Note (Signed)
Renal Service Consult Note [facility]  Rhonda Lynch 04/02/2018 [name] Requesting Physician:  Dr.   Luiz Iron for Consult:  ESRD pt w/ SOB HPI: The patient is a 62 y.o. 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 She was dc'd home early July on pred and cytoxan to continue dialysis on tiw basis.  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 She has been getting her HD tiw except she missed HD yesterday.  Today she is in ED w/ severe SOB , at rest and w/ exertion.  CXR shows bilat infiltrates vs edema.  She c/o bilat ankle edema.  Not coughing or hemoptysis, no fevers , no prod cough, no CP or abd pain.  Making urine once daily.  Creat in ed is 7.37.  Asked to see for ESRD.    ROS  denies CP  no joint pain   no HA  no blurry vision  no rash  no diarrhea  no nausea/ vomiting   Past Medical History  Past Medical History:  Diagnosis Date  . Chronic kidney disease   . ESRD (end stage renal disease) ([place])   . Hypertension   . Oxygen deficiency    2 L at night  . Substance abuse ([place])    has been clean for 4 months    Past Surgical History  Past Surgical History:  Procedure Laterality Date  . AV FISTULA PLACEMENT Left 03/05/2018   Procedure: ARTERIOVENOUS (AV) FISTULA CREATION  LEFT UPPER EXTREMITY;  Surgeon: [name], MD;  Location: [place];  Service: Vascular;  Laterality: Left;  . HEMATOMA EVACUATION Left 03/06/2018   Procedure: EVACUATION HEMATOMA LEFT ARM;  Surgeon: Angelia Mould, MD;  Location: [place];  Service: Vascular;  Laterality: Left;  . IR FLUORO GUIDE CV LINE RIGHT  02/18/2018  . IR FLUORO GUIDE CV LINE RIGHT  02/26/2018  . IR US GUIDE VASC ACCESS RIGHT  02/18/2018   Family History  Family History  Problem  Relation Age of Onset  . Autoimmune disease Neg Hx    Social History  reports that she has been smoking.  She has been smoking about 0.50 packs per day. She has never used smokeless tobacco. She reports that she drinks alcohol. She reports that she has current or past drug history. Drug: Cocaine. Allergies No Known Allergies Home medications Prior to Admission medications   Medication Sig Start Date End Date Taking? Authorizing Provider  cyclophosphamide (CYTOXAN) 50 MG capsule Take 1 capsule (50 mg total) by mouth daily. Give on an empty stomach 1 hour before or 2 hours after meals. 03/25/18  Yes Clent Demark, PA-C  famotidine (PEPCID) 20 MG tablet Take 1 tablet (20 mg total) by mouth daily. 03/16/18  Yes Elgergawy, Silver Huguenin, MD  Insulin Glargine (LANTUS) 100 UNIT/ML Solostar Pen Inject 5 Units into the skin at bedtime. Patient taking differently: Inject 5 Units into the skin daily.  03/25/18  Yes Clent Demark, PA-C  Multiple Vitamin (MULTIVITAMIN WITH MINERALS) TABS tablet Take 1 tablet by mouth daily. 03/16/18  Yes Elgergawy, Silver Huguenin, MD  predniSONE (DELTASONE) 20 MG tablet Take 2 tablets (40 mg total) by mouth daily with breakfast. 03/25/18  Yes Clent Demark, PA-C  sulfamethoxazole-trimethoprim (BACTRIM DS,SEPTRA DS)  800-160 MG tablet Take 1 tablet by mouth 3 (three) times a week. Patient taking differently: Take 1 tablet by mouth every Tuesday, Thursday, and Saturday at 6 PM.  03/17/18  Yes Elgergawy, Silver Huguenin, MD  traMADol (ULTRAM) 50 MG tablet Take 1 tablet (50 mg total) by mouth every 12 (twelve) hours as needed (refractory pain). 03/24/18  Yes Patrecia Pour, MD  Insulin Pen Needle 31G X 5 MM MISC Use for once daily injections 03/25/18   Clent Demark, PA-C  thiamine 100 MG tablet Take 1 tablet (100 mg total) by mouth daily. 03/25/18   Clent Demark, PA-C   Liver Function Tests Recent Labs  Lab 04/02/18 1445  AST 25  ALT 28  ALKPHOS 69  BILITOT 1.0  PROT 5.6*   ALBUMIN 3.1*   No results for input(s): LIPASE, AMYLASE in the last 168 hours. CBC Recent Labs  Lab 03/27/18 1107 04/02/18 1445  WBC 8.1 11.0*  NEUTROABS 7.3*  --   HGB 8.0* 8.8*  HCT 25.3* 28.3*  MCV 83 86.5  PLT 126* 086*   Basic Metabolic Panel Recent Labs  Lab 04/02/18 1445  NA 143  K 4.4  CL 105  CO2 21*  GLUCOSE 315*  BUN 68*  CREATININE 7.37*  CALCIUM 8.0*   Iron/TIBC/Ferritin/ %Sat    Component Value Date/Time   IRON 114 03/22/2018 0750   TIBC 225 (L) 03/22/2018 0750   FERRITIN 958 (H) 03/22/2018 0750   IRONPCTSAT 51 (H) 03/22/2018 0750    Vitals:   04/02/18 1500 04/02/18 1530 04/02/18 1600 04/02/18 1630  BP: (!) 158/103 (!) 160/112 (!) 146/99 (!) 140/92  Pulse: 99 (!) 105 82 81  Resp: (!) 29 (!) 28 12 (!) 22  Temp:      TempSrc:      SpO2: 99% 98% 99% 99%  Weight:      Height:       Exam Gen on bipap , alert and responsive, RR 18 No rash, cyanosis or gangrene Sclera anicteric, throat not seen  No jvd or bruits Chest fine bibasilar crackles 1/3 up, no wheezing RRR no MRG, distant hs Abd soft ntnd no mass or ascites +bs GU defer MS no joint effusions or deformity Ext 1-2 + bilat pretib  edema, no wounds or ulcers Neuro is alert, Ox 3 , nf    Home meds:  - cytoxan 50 mg qd/ prednisone 40 mg qd  - pepcid 20 qd/ lantus 5u hs/ mvi/ bactrim DS 1 tiw/ prn ultram/ thiamine   Dialysis: TTS   4h   67.5kg  2/2.25 bath   Hep 2000  - mircera 75 ug q 2wks last 7/19  - calcitriol 0.25 ug   Impression: 1  Dyspnea/ resp distress - 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  Will plan acute HD tonight, max UF 3-4 L as tolerated.   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  If not showing  signs of renal recovery soon (1st HD was 02/19/18), cytoxan will be stopped.   4  BP - no BP meds at home 5  Hx cocaine/ etoh abuse - pt no longer abusing these 6  Hep C  7  Thrombocytopenia , chronic  Plan - as above  Kelly Splinter MD [employer] pager [phone]   04/02/2018, 5:10 PM

## 2018-04-02 NOTE — ED Notes (Signed)
Per RN approval pt was removed removed from CPAP and allowed to eat a 1/2 cup of ice and then placed back on CPAP.  Pt was very happy for the break from the mask and the ice.  Pt's sats slowly dropped to 88-90% by the time she finished the ice and was CPAP was reapplied.  Time frame of about 5 min. This tech remained at bedside throughout.

## 2018-04-02 NOTE — Telephone Encounter (Signed)
Thank you for letting me know. Agree with ED

## 2018-04-02 NOTE — ED Notes (Signed)
Pt CBG 285.

## 2018-04-02 NOTE — ED Notes (Signed)
Critical Trop given to [place].

## 2018-04-02 NOTE — H&P (Signed)
History and Physical    Rhonda Lynch JKD:[id number] DOB: Jan 22, 1956 DOA: 04/02/2018  PCP: Patient, No Pcp Per Patient coming from: home  Chief Complaint: sob  HPI: Rhonda Lynch is a 62 y.o. female with medical history significant for cocaine and EtOH abuse. End-stage renal disease Tuesday Thursday Saturday dialysis patient as well as plasmapheresis has been started on steroids and cyclophosphamide, Streptococcus pneumonia, BOOP, since emergency department with the chief complaint shortness of breath. Initial evaluation reveals acute respiratory failure likely related to pulmonary edema secondary to missing dialysis. [facility] hospitalists are asked to admit  Information is obtained from the patient and the chart. She was discharged 9 days ago after a 5 day hospital stay for same. During that hospitalization her prednisone was increased by pulmonology. He was discharged on home oxygen.patient states she was unable to go to dialysis yesterday due to transportation issues. She states last night she became quickly short of breath. Associated symptoms include chest "pressure". She states this pressure is located left anterior nonradiating. She denies diaphoresis nausea vomiting. She denies headache dizziness syncope or near-syncope. She denies abdominal pain nausea vomiting diarrhea. She reports she continues to make some urine but has no dysuria or hematuria. He reports compliance with her medications. EMS was called and patient was found hypoxic. Reportedly EMS initiated BiPAP.    ED Course: in the emergency department she's afebrile hemodynamically stable with a blood pressure high and of normal. Mild tachypnea oxygen saturation level 99% on BiPAP  Review of Systems: As per HPI otherwise all other systems reviewed and are negative.   Ambulatory Status:ambulates independently  Past Medical History:  Diagnosis Date  . Chronic kidney disease   . ESRD (end stage renal disease) ([place])   .  Hypertension   . Oxygen deficiency    2 L at night  . Substance abuse ([place])    has been clean for 4 months     Past Surgical History:  Procedure Laterality Date  . AV FISTULA PLACEMENT Left 03/05/2018   Procedure: ARTERIOVENOUS (AV) FISTULA CREATION  LEFT UPPER EXTREMITY;  Surgeon: [name], MD;  Location: [place];  Service: Vascular;  Laterality: Left;  . HEMATOMA EVACUATION Left 03/06/2018   Procedure: EVACUATION HEMATOMA LEFT ARM;  Surgeon: Angelia Mould, MD;  Location: [place];  Service: Vascular;  Laterality: Left;  . IR FLUORO GUIDE CV LINE RIGHT  02/18/2018  . IR FLUORO GUIDE CV LINE RIGHT  02/26/2018  . IR US GUIDE VASC ACCESS RIGHT  02/18/2018    Social History   Socioeconomic History  . Marital status: Married    Spouse name: Not on file  . Number of children: Not on file  . Years of education: Not on file  . Highest education level: Not on file  Occupational History  . Not on file  Social Needs  . Financial resource strain: Not on file  . Food insecurity:    Worry: Not on file    Inability: Not on file  . Transportation needs:    Medical: Not on file    Non-medical: Not on file  Tobacco Use  . Smoking status: Current Every Day Smoker    Packs/day: 0.50  . Smokeless tobacco: Never Used  . Tobacco comment: trying to quit 04/02/2018  Substance and Sexual Activity  . Alcohol use: Yes  . Drug use: Not Currently    Types: Cocaine  . Sexual activity: Not Currently    Birth control/protection: None  Lifestyle  .  Physical activity:    Days per week: Not on file    Minutes per session: Not on file  . Stress: Not on file  Relationships  . Social connections:    Talks on phone: Not on file    Gets together: Not on file    Attends religious service: Not on file    Active member of club or organization: Not on file    Attends meetings of clubs or organizations: Not on file    Relationship status: Not on file  . Intimate partner violence:    Fear of  current or ex partner: Not on file    Emotionally abused: Not on file    Physically abused: Not on file    Forced sexual activity: Not on file  Other Topics Concern  . Not on file  Social History Narrative  . Not on file    No Known Allergies  Family History  Problem Relation Age of Onset  . Autoimmune disease Neg Hx     Prior to Admission medications   Medication Sig Start Date End Date Taking? Authorizing Provider  cyclophosphamide (CYTOXAN) 50 MG capsule Take 1 capsule (50 mg total) by mouth daily. Give on an empty stomach 1 hour before or 2 hours after meals. 03/25/18  Yes Clent Demark, PA-C  famotidine (PEPCID) 20 MG tablet Take 1 tablet (20 mg total) by mouth daily. 03/16/18  Yes Elgergawy, Silver Huguenin, MD  Insulin Glargine (LANTUS) 100 UNIT/ML Solostar Pen Inject 5 Units into the skin at bedtime. Patient taking differently: Inject 5 Units into the skin daily.  03/25/18  Yes Clent Demark, PA-C  Multiple Vitamin (MULTIVITAMIN WITH MINERALS) TABS tablet Take 1 tablet by mouth daily. 03/16/18  Yes Elgergawy, Silver Huguenin, MD  predniSONE (DELTASONE) 20 MG tablet Take 2 tablets (40 mg total) by mouth daily with breakfast. 03/25/18  Yes Clent Demark, PA-C  sulfamethoxazole-trimethoprim (BACTRIM DS,SEPTRA DS) 800-160 MG tablet Take 1 tablet by mouth 3 (three) times a week. Patient taking differently: Take 1 tablet by mouth every Tuesday, Thursday, and Saturday at 6 PM.  03/17/18  Yes Elgergawy, Silver Huguenin, MD  traMADol (ULTRAM) 50 MG tablet Take 1 tablet (50 mg total) by mouth every 12 (twelve) hours as needed (refractory pain). 03/24/18  Yes Patrecia Pour, MD  Insulin Pen Needle 31G X 5 MM MISC Use for once daily injections 03/25/18   Clent Demark, PA-C  thiamine 100 MG tablet Take 1 tablet (100 mg total) by mouth daily. 03/25/18   Clent Demark, PA-C    Physical Exam: Vitals:   04/02/18 1500 04/02/18 1530 04/02/18 1600 04/02/18 1630  BP: (!) 158/103 (!) 160/112 (!) 146/99  (!) 140/92  Pulse: 99 (!) 105 82 81  Resp: (!) 29 (!) 28 12 (!) 22  Temp:      TempSrc:      SpO2: 99% 98% 99% 99%  Weight:      Height:         General:  Appears calm and comfortable on BiPAP in no acute distress Eyes:  PERRL, EOMI, normal lids, iris ENT:  grossly normal hearing, lips & tongue,  Neck:  no LAD, masses or thyromegaly Cardiovascular:  RRR, no m/r/g. No LE edema. 2+ lower extremity edema bilaterally Respiratory:  Mild to moderate increased work of breathing with conversation. Breath sounds diminished to do hear crackles throughout. Abdomen:  soft, ntnd, positive bowel sounds no guarding or rebounding Skin:  no rash or  induration seen on limited exam Musculoskeletal:  grossly normal tone BUE/BLE, good ROM, no bony abnormality Psychiatric:  grossly normal mood and affect, speech fluent and appropriate, AOx3 Neurologic:  CN 2-12 grossly intact, moves all extremities in coordinated fashion, sensation intact  Labs on Admission: I have personally reviewed following labs and imaging studies  CBC: Recent Labs  Lab 03/27/18 1107 04/02/18 1445  WBC 8.1 11.0*  NEUTROABS 7.3*  --   HGB 8.0* 8.8*  HCT 25.3* 28.3*  MCV 83 86.5  PLT 126* 703*   Basic Metabolic Panel: Recent Labs  Lab 04/02/18 1445  NA 143  K 4.4  CL 105  CO2 21*  GLUCOSE 315*  BUN 68*  CREATININE 7.37*  CALCIUM 8.0*   GFR: Estimated Creatinine Clearance: 6.9 mL/min (A) (by C-G formula based on SCr of 7.37 mg/dL (H)). Liver Function Tests: Recent Labs  Lab 04/02/18 1445  AST 25  ALT 28  ALKPHOS 69  BILITOT 1.0  PROT 5.6*  ALBUMIN 3.1*   No results for input(s): LIPASE, AMYLASE in the last 168 hours. No results for input(s): AMMONIA in the last 168 hours. Coagulation Profile: No results for input(s): INR, PROTIME in the last 168 hours. Cardiac Enzymes: Recent Labs  Lab 04/02/18 1445  TROPONINI 0.20*   BNP (last 3 results) No results for input(s): PROBNP in the last 8760  hours. HbA1C: No results for input(s): HGBA1C in the last 72 hours. CBG: Recent Labs  Lab 04/02/18 1642  GLUCAP 285*   Lipid Profile: No results for input(s): CHOL, HDL, LDLCALC, TRIG, CHOLHDL, LDLDIRECT in the last 72 hours. Thyroid Function Tests: No results for input(s): TSH, T4TOTAL, FREET4, T3FREE, THYROIDAB in the last 72 hours. Anemia Panel: No results for input(s): VITAMINB12, FOLATE, FERRITIN, TIBC, IRON, RETICCTPCT in the last 72 hours. Urine analysis:    Component Value Date/Time   COLORURINE YELLOW 03/19/2018 1738   APPEARANCEUR HAZY (A) 03/19/2018 1738   LABSPEC 1.014 03/19/2018 1738   PHURINE 6.0 03/19/2018 1738   GLUCOSEU 50 (A) 03/19/2018 1738   HGBUR LARGE (A) 03/19/2018 1738   HGBUR trace-intact 03/08/2010 1452   BILIRUBINUR NEGATIVE 03/19/2018 1738   KETONESUR NEGATIVE 03/19/2018 1738   PROTEINUR >=300 (A) 03/19/2018 1738   UROBILINOGEN 0.2 03/08/2010 1452   NITRITE NEGATIVE 03/19/2018 1738   LEUKOCYTESUR TRACE (A) 03/19/2018 1738    Creatinine Clearance: Estimated Creatinine Clearance: 6.9 mL/min (A) (by C-G formula based on SCr of 7.37 mg/dL (H)).  Sepsis Labs: @LABRCNTIP (procalcitonin:4,lacticidven:4) )No results found for this or any previous visit (from the past 240 hour(s)).   Radiological Exams on Admission: Dg Chest Port 1 View  Result Date: 04/02/2018 CLINICAL DATA:  Shortness of breath. EXAM: PORTABLE CHEST 1 VIEW COMPARISON:  Radiographs of March 23, 2018. FINDINGS: Stable cardiomegaly. Atherosclerosis of thoracic aorta is noted. Stable diffuse reticular densities are noted throughout both lungs which may represent chronic interstitial lung disease or scarring, but acute superimposed edema or inflammation cannot be excluded. No pneumothorax or significant pleural effusion is noted. Right internal jugular dialysis catheter is unchanged in position. Bony thorax is unremarkable. IMPRESSION: Stable bilateral diffuse reticular lung opacities are  noted which may represent chronic interstitial lung disease or scarring, but acute superimposed edema or inflammation cannot be excluded. Electronically Signed   By: Marijo Conception, M.D.   On: 04/02/2018 14:56    EKG: Independently reviewed. Sinus tachycardia Ventricular premature complex Aberrant conduction of SV complex(es) Artifact No significant change since last tracing  Assessment/Plan Principal Problem:  Acute respiratory failure (HCC) Active Problems:   ESRD (end stage renal disease) (HCC)   Elevated troponin   Acute pulmonary edema (HCC)   Cocaine abuse (HCC)   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[place])   Diabetes ([place])   #1. Acute respiratory failure. Likely related to pulmonary edema secondary to missing dialysis in the setting of known Boop on prednisone and followed by pulmonology. Chest x-ray as noted above.patient on BiPAP on admission. Resting comfortably evaluated by nephrology who recommends acute dialysis -Admit -continue BiPAP as needed -nebulizers -Continue home prednisone -Wean BiPAP as able -Dialysis per nephrology  #2. End-stage renal disease. Patient missed dialysis yesterday. She is a Tuesday Thursday Saturday patient. Evaluated by nephrology who recommended dialysis today. -Defer to nephrology  #3. Elevated troponin. History of same but not quite as high as today's value. She does have chest tightness prior to admission. Likely related to above. EKG without acute changes. -We'll cycle troponin -serial EKGs -Supportive therapy  #4.Boop. Evaluated by pulmonology last admission. Prednisone was started. At discharge chest x-ray showed significant improvement. Chart review indicates six-month taper of prednisone planned as well as outpatient follow-up with pulmonary  #5. Diabetes. Recent hemoglobin A1c 6.8%. Home medications include Lantus. -Continue home Lantus -Sliding scale for optimal control  6.history of cocaine use. Urine drug  screen most recent admission negative. Prior to that was positive. -Urine drug screen   DVT prophylaxis:  heparin Code Status: full  Family Communication: none present  Disposition Plan: home  Consults called: [facility]  Admission status: obs    Dyanne Carrel M MD [facility]  If 7PM-7AM, please contact night-coverage [email or web address] Password [facility]  04/02/2018, 5:37 PM

## 2018-04-02 NOTE — Progress Notes (Addendum)
Shift event: Pt was admitted tonight for fluid overload after missing HD yesterday. She went straight to HD from ED. Two hours into her HD, her HR went into the 220s and she felt SOB. O2 at 4L per [place]. Code blue was called due to this and their only being one RN in HD. The pt never lost consciousness, pulse, or respirations. Code team responded and gave 6mg  Adenosine and pt quickly returned to NSR with a HR between 70-90. NP called to unit by RRRN.  S: No chest pain or pain anywhere except for a "slight" HA. No SOB. States she feels better just completing 2 hours of HD. No n/v. No diaphoresis. Hasn't done Cocaine in 5 months and has pretty much quit drinking ETOH, but had about 2 inches of beer this past weekend.  O: Chronically ill appearing AAF in NAD. BP 150s. HR 70s-90s RRR. RR  16 with O2 sat upper 90s on 4L [place]. She is a little sleepy (had Dilaudid) but is oriented x 4. No respiratory distress. RRR. 1+ pedal edema. Speech is clear and no focal neuro deficits noted.  A/P: 1. SVT in HD-resolved with 6mg  Adenosine x 1. Code blue called but no loss of pulse or respirations.  2. Volume overload due to missing HD yesterday. Had a total of 1500cc removed during 2 hours and 9 minutes of HD. Feels better. Does not sound wet, is in no respiratory distress, and is satting well on 4L. Was on Bipap when first here, but does not appear to need it now. Will watch for need. 3. HTN-improved. Continue meds. No BB right now given suspicion and past hx of Cocaine use. 4. One episode of chest tightness with slightly elevated troponin without EKG changes. Continue to cycle enzymes. Likely demand ischemia due to volume overload. R/p EKG now. NTG patch removed before HD per protocol. Reapply now.   5. BOOP-stable at this time.  6. ESRD-may need more HD tomorrow. Defer to Nephro. Labs ordered by code team.  7. ? Seizure, but low suspicion-HD RN thought pt was shaking a little when first found HR to be in the 200 range. Will  watch for now. No hx of seizures on chart.  8. Hx cocaine use and ETOH. Denies now. UDS pending.  9. DM II-stable, CBG 196. Continue Lantus and SSI.  Pt transferred to SDU by NP and transporter.  VS on SDU: temp 97.8, HR 111, RR 21, BP 134/99, SaO2 99% on 4L.    Total critical care time: 60 minutes Critical care time was exclusive of separately billable procedures and treating other patients. Critical care was necessary to treat or prevent imminent or life-threatening deterioration. Critical care was time spent personally by me on the following activities: development of treatment plan with patient and/or surrogate as well as nursing, discussions with consultants, evaluation of patient's response to treatment, examination of patient, obtaining history from patient or surrogate, ordering and performing treatments and interventions, ordering and review of laboratory studies, ordering and review of radiographic studies, pulse oximetry and re-evaluation of patient's condition. KJKG, NP [facility]

## 2018-04-02 NOTE — ED Provider Notes (Signed)
[facility] EMERGENCY DEPARTMENT Provider Note   CSN: [id number] Arrival date & time: 04/02/18  1434     History   Chief Complaint Chief Complaint  Patient presents with  . Respiratory Distress    HPI Rhonda Lynch is a 62 y.o. female.  HPI Patient presents to the emergency room with complaints of acute shortness of breath.  Patient has a history of chronic kidney disease.  She is on dialysis.  Patient last went to dialysis on Saturday.  She was supposed to go yesterday but was unable to make it because of transportation issues.  Patient felt acutely short of breath.  She is having pressure in her chest now.  Feels like a tightness in the lower part of her chest.  Symptoms became much more severe she had to call EMS.  EMS found that she was hypoxic and initiated BiPAP.  Patient is feeling somewhat better but is still very short of breath she denies any fevers.  No abdominal pain.  No vomiting or diarrhea. Past Medical History:  Diagnosis Date  . Chronic kidney disease   . Oxygen deficiency    2 L at night  . Substance abuse ([place])    has been clean for 4 months     Patient Active Problem List   Diagnosis Date Noted  . Acute pulmonary edema (HCC)   . Elevated troponin 03/19/2018  . AKI (acute kidney injury) ([place])   . Fever   . 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 Diffuse pulmonary alveolar hemorrhage   . Hypoxemia   . Pulmonary infiltrate   . 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 ESRD (end stage renal disease) ([place]) 02/12/2018  . Acute respiratory failure with hypoxia ([place]) 02/12/2018  . Alcohol abuse 02/12/2018  . Anemia 02/12/2018  . Cocaine abuse ([place]) 02/12/2018  . Epigastric pain 02/12/2018  . Pneumonia 02/12/2018  . TOBACCO ABUSE 03/08/2010  . PNEUMONIA 03/08/2010  . UTI 03/08/2010  . ELEVATED BLOOD PRESSURE WITHOUT DIAGNOSIS OF HYPERTENSION 03/08/2010    Past Surgical History:  Procedure Laterality Date  . AV  FISTULA PLACEMENT Left 03/05/2018   Procedure: ARTERIOVENOUS (AV) FISTULA CREATION  LEFT UPPER EXTREMITY;  Surgeon: [name], MD;  Location: [facility];  Service: Vascular;  Laterality: Left;  . HEMATOMA EVACUATION Left 03/06/2018   Procedure: EVACUATION HEMATOMA LEFT ARM;  Surgeon: Angelia Mould, MD;  Location: [facility];  Service: Vascular;  Laterality: Left;  . IR FLUORO GUIDE CV LINE RIGHT  02/18/2018  . IR FLUORO GUIDE CV LINE RIGHT  02/26/2018  . IR US GUIDE VASC ACCESS RIGHT  02/18/2018     OB History   None      Home Medications    Prior to Admission medications   Medication Sig Start Date End Date Taking? Authorizing Provider  cyclophosphamide (CYTOXAN) 50 MG capsule Take 1 capsule (50 mg total) by mouth daily. Give on an empty stomach 1 hour before or 2 hours after meals. 03/25/18  Yes Clent Demark, PA-C  famotidine (PEPCID) 20 MG tablet Take 1 tablet (20 mg total) by mouth daily. 03/16/18  Yes Elgergawy, Silver Huguenin, MD  Insulin Glargine (LANTUS) 100 UNIT/ML Solostar Pen Inject 5 Units into the skin at bedtime. Patient taking differently: Inject 5 Units into the skin daily.  03/25/18  Yes Clent Demark, PA-C  Multiple Vitamin (MULTIVITAMIN WITH MINERALS) TABS tablet Take 1 tablet by mouth daily. 03/16/18  Yes Elgergawy, Silver Huguenin, MD  predniSONE (DELTASONE) 20 MG tablet Take 2 tablets (40 mg total) by mouth daily with breakfast. 03/25/18  Yes Clent Demark, PA-C  sulfamethoxazole-trimethoprim (BACTRIM DS,SEPTRA DS) 800-160 MG tablet Take 1 tablet by mouth 3 (three) times a week. Patient taking differently: Take 1 tablet by mouth every Tuesday, Thursday, and Saturday at 6 PM.  03/17/18  Yes Elgergawy, Silver Huguenin, MD  traMADol (ULTRAM) 50 MG tablet Take 1 tablet (50 mg total) by mouth every 12 (twelve) hours as needed (refractory pain). 03/24/18  Yes Patrecia Pour, MD  Insulin Pen Needle 31G X 5 MM MISC Use for once daily injections 03/25/18   Clent Demark, PA-C  thiamine  100 MG tablet Take 1 tablet (100 mg total) by mouth daily. 03/25/18   Clent Demark, PA-C    Family History Family History  Problem Relation Age of Onset  . Autoimmune disease Neg Hx     Social History Social History   Tobacco Use  . Smoking status: Current Every Day Smoker    Packs/day: 0.50  . Smokeless tobacco: Never Used  . Tobacco comment: trying to quit 04/02/2018  Substance Use Topics  . Alcohol use: Yes  . Drug use: Not Currently    Types: Cocaine     Allergies   Patient has no known allergies.   Review of Systems Review of Systems  All other systems reviewed and are negative.    Physical Exam Updated Vital Signs BP (!) 160/112   Pulse (!) 105   Temp 98 F (36.7 C) (Axillary)   Resp (!) 28   Ht 1.549 m (5\' 1" )   Wt 69.4 kg (153 lb)   SpO2 98%   BMI 28.91 kg/m   Physical Exam  Constitutional: She appears well-developed and well-nourished. She appears distressed.  HENT:  Head: Normocephalic and atraumatic.  Right Ear: External ear normal.  Left Ear: External ear normal.  Eyes: Conjunctivae are normal. Right eye exhibits no discharge. Left eye exhibits no discharge. No scleral icterus.  Neck: Neck supple. No tracheal deviation present.  Cardiovascular: Normal rate, regular rhythm and intact distal pulses.  Pulmonary/Chest: No stridor. Tachypnea noted. She is in respiratory distress. She has no wheezes. She has rales.  Abdominal: Soft. Bowel sounds are normal. She exhibits no distension. There is no tenderness. There is no rebound and no guarding.  Musculoskeletal: She exhibits edema. She exhibits no tenderness.  Neurological: She is alert. She has normal strength. No cranial nerve deficit (no facial droop, extraocular movements intact, no slurred speech) or sensory deficit. She exhibits normal muscle tone. She displays no seizure activity. Coordination normal.  Skin: Skin is warm and dry. No rash noted.  Psychiatric: She has a normal mood and  affect.  Nursing note and vitals reviewed.    ED Treatments / Results  Labs (all labs ordered are listed, but only abnormal results are displayed) Labs Reviewed  CBC - Abnormal; Notable for the following components:      Result Value   WBC 11.0 (*)    RBC 3.27 (*)    Hemoglobin 8.8 (*)    HCT 28.3 (*)    RDW 19.8 (*)    Platelets 132 (*)    All other components within normal limits  COMPREHENSIVE METABOLIC PANEL - Abnormal; Notable for the following components:   CO2 21 (*)    Glucose, Bld 315 (*)    BUN 68 (*)    Creatinine, Ser 7.37 (*)    Calcium 8.0 (*)  Total Protein 5.6 (*)    Albumin 3.1 (*)    GFR calc non Af Amer 5 (*)    GFR calc Af Amer 6 (*)    Anion gap 17 (*)    All other components within normal limits  TROPONIN I - Abnormal; Notable for the following components:   Troponin I 0.20 (*)    All other components within normal limits    EKG EKG Interpretation  Date/Time:  Wednesday April 02 2018 14:42:27 EDT Ventricular Rate:  120 PR Interval:    QRS Duration: 128 QT Interval:  327 QTC Calculation: 461 R Axis:   -27 Text Interpretation:  Sinus tachycardia Ventricular premature complex Aberrant conduction of SV complex(es) Artifact No significant change since last tracing Confirmed by Dorie Rank [phone]) on 04/02/2018 2:50:24 PM   Radiology Dg Chest Port 1 View  Result Date: 04/02/2018 CLINICAL DATA:  Shortness of breath. EXAM: PORTABLE CHEST 1 VIEW COMPARISON:  Radiographs of March 23, 2018. FINDINGS: Stable cardiomegaly. Atherosclerosis of thoracic aorta is noted. Stable diffuse reticular densities are noted throughout both lungs which may represent chronic interstitial lung disease or scarring, but acute superimposed edema or inflammation cannot be excluded. No pneumothorax or significant pleural effusion is noted. Right internal jugular dialysis catheter is unchanged in position. Bony thorax is unremarkable. IMPRESSION: Stable bilateral diffuse reticular  lung opacities are noted which may represent chronic interstitial lung disease or scarring, but acute superimposed edema or inflammation cannot be excluded. Electronically Signed   By: Marijo Conception, M.D.   On: 04/02/2018 14:56    Procedures .Critical Care Performed by: Dorie Rank, MD Authorized by: Dorie Rank, MD   Critical care provider statement:    Critical care time (minutes):  30   Critical care was time spent personally by me on the following activities:  Discussions with consultants, evaluation of patient's response to treatment, examination of patient, ordering and performing treatments and interventions, ordering and review of laboratory studies, ordering and review of radiographic studies, pulse oximetry, re-evaluation of patient's condition, obtaining history from patient or surrogate and review of old charts   (including critical care time)  Medications Ordered in ED Medications  nitroGLYCERIN (NITROSTAT) SL tablet 0.4 mg (has no administration in time range)  nitroGLYCERIN (NITROGLYN) 2 % ointment 1 inch (has no administration in time range)  ondansetron (ZOFRAN) injection 4 mg (4 mg Intravenous Given 04/02/18 1509)     Initial Impression / Assessment and Plan / ED Course  I have reviewed the triage vital signs and the nursing notes.  Pertinent labs & imaging results that were available during my care of the patient were reviewed by me and considered in my medical decision making (see chart for details).  Clinical Course as of Apr 03 1611  Wed Apr 02, 2018  1611 I spoke with Dr. Justin Mend.  He will evaluate the patient regarding her missed dialysis   [JK]  1611 Labs are consistent with her chronic renal failure and she also has a stable anemia.  Troponin is also elevated   [JK]    Clinical Course User Index [JK] Dorie Rank, MD   Patient presented to the emergency room for evaluation of shortness of breath in the setting of missed dialysis.  Her symptoms are consistent  with recurrent pulmonary edema.  I have ordered nitroglycerin to help with the pulmonary edema and her hypertension.  I spoke with nephrology, Dr. Justin Mend.  I will consult with hospitalist service for admission Final Clinical Impressions(s) /  ED Diagnoses   Final diagnoses:  Acute pulmonary edema ([place])  Chronic renal failure, unspecified CKD stage      Dorie Rank, MD 04/03/18 [phone]

## 2018-04-02 NOTE — ED Triage Notes (Signed)
Pt arrives to ED from home with complaints of respiratory distress since yesterday. EMS reports pt is dialysis, last treatment 4 days ago, states she had trouble with transportation recently. Pt on Cpap with EMS, placed on Bipap by respiratory upon arrival to ED. Abdominal pain, edema to lower extremities bilaterally. RR 40, CBG 345, 160/110. Pt placed in position of comfort with bed locked and lowered, call bell in reach.

## 2018-04-02 NOTE — Progress Notes (Signed)
HD tx initiated via HD cath w/o problem, bilat ports: pull/push/flush equally w/o problem, VSS w/ increased bp, nitro patch was removed from lt chest prior to HD tx initiation, will cont to monitor while on HD tx,

## 2018-04-02 NOTE — ED Notes (Signed)
Admitting at bedside 

## 2018-04-02 NOTE — Progress Notes (Signed)
  Patient Name: JERA HEADINGS   MRN: [id number]   Date of Birth/ Sex: 1956-07-11 , female      Admission Date: 04/02/2018  Attending Provider: Paticia Stack, MD  Primary Diagnosis: Acute respiratory failure [facility])   Indication: Pt was in her usual state of health until this PM, when she was noted to be tachycardic to the 220s and developed SOB while in dialysis. Code blue was subsequently called. At the time of arrival on scene, ACLS protocol was underway.   Technical Description:  - CPR performance duration:  0 minutes  - Was defibrillation or cardioversion used? No   - Was external pacer placed? Yes  - Was patient intubated pre/post CPR? No   Medications Administered: Y = Yes; Blank = No Amiodarone    Atropine    Calcium    Epinephrine    Lidocaine    Magnesium    Norepinephrine    Phenylephrine    Sodium bicarbonate    Vasopressin    Adenosine 6mg         Y  Post CPR evaluation:  - Final Status - Was patient successfully resuscitated ? Yes - What is current rhythm? NSR - What is current hemodynamic status? Stable  Miscellaneous Information:  - Labs sent, including: CBC, CMP, phos  - Primary team notified?  Yes  - Family Notified? No  - Additional notes/ transfer status:  To step down.      Asencion Noble, MD  04/02/2018, 9:45 PM

## 2018-04-02 NOTE — Telephone Encounter (Signed)
Spoke with pt. Pt c/o increase in SOB to a point she states with any activity she becomes even more SOB. Pt states she feels she will pass out anytime she stands up, pt c/o BIL LE edema - stating 'it looks like they are going to pop', and BIL LE weakness. When speaking with pt over the phone she was very tachypneic and working to breathe. Advised pt to go to ED/call EMS, pt verbalized understanding.  Will route to RB as FYI.

## 2018-04-03 ENCOUNTER — Observation Stay (HOSPITAL_COMMUNITY): Payer: Medicaid Other

## 2018-04-03 ENCOUNTER — Ambulatory Visit (HOSPITAL_COMMUNITY): Payer: Medicaid Other

## 2018-04-03 ENCOUNTER — Encounter (HOSPITAL_COMMUNITY): Payer: Self-pay | Admitting: Nephrology

## 2018-04-03 DIAGNOSIS — Z9049 Acquired absence of other specified parts of digestive tract: Secondary | ICD-10-CM | POA: Diagnosis not present

## 2018-04-03 DIAGNOSIS — J9601 Acute respiratory failure with hypoxia: Secondary | ICD-10-CM | POA: Diagnosis not present

## 2018-04-03 DIAGNOSIS — I120 Hypertensive chronic kidney disease with stage 5 chronic kidney disease or end stage renal disease: Secondary | ICD-10-CM | POA: Diagnosis present

## 2018-04-03 DIAGNOSIS — Z8701 Personal history of pneumonia (recurrent): Secondary | ICD-10-CM | POA: Diagnosis not present

## 2018-04-03 DIAGNOSIS — J8489 Other specified interstitial pulmonary diseases: Secondary | ICD-10-CM

## 2018-04-03 DIAGNOSIS — J810 Acute pulmonary edema: Principal | ICD-10-CM

## 2018-04-03 DIAGNOSIS — E8770 Fluid overload, unspecified: Secondary | ICD-10-CM | POA: Diagnosis present

## 2018-04-03 DIAGNOSIS — I4891 Unspecified atrial fibrillation: Secondary | ICD-10-CM

## 2018-04-03 DIAGNOSIS — F1410 Cocaine abuse, uncomplicated: Secondary | ICD-10-CM | POA: Diagnosis present

## 2018-04-03 DIAGNOSIS — Z794 Long term (current) use of insulin: Secondary | ICD-10-CM | POA: Diagnosis not present

## 2018-04-03 DIAGNOSIS — D696 Thrombocytopenia, unspecified: Secondary | ICD-10-CM | POA: Diagnosis present

## 2018-04-03 DIAGNOSIS — N189 Chronic kidney disease, unspecified: Secondary | ICD-10-CM

## 2018-04-03 DIAGNOSIS — E1122 Type 2 diabetes mellitus with diabetic chronic kidney disease: Secondary | ICD-10-CM | POA: Diagnosis present

## 2018-04-03 DIAGNOSIS — Z9981 Dependence on supplemental oxygen: Secondary | ICD-10-CM | POA: Diagnosis not present

## 2018-04-03 DIAGNOSIS — Z9115 Patient's noncompliance with renal dialysis: Secondary | ICD-10-CM | POA: Diagnosis not present

## 2018-04-03 DIAGNOSIS — I471 Supraventricular tachycardia: Secondary | ICD-10-CM | POA: Diagnosis present

## 2018-04-03 DIAGNOSIS — D649 Anemia, unspecified: Secondary | ICD-10-CM | POA: Diagnosis present

## 2018-04-03 DIAGNOSIS — N186 End stage renal disease: Secondary | ICD-10-CM | POA: Diagnosis present

## 2018-04-03 DIAGNOSIS — Z7952 Long term (current) use of systemic steroids: Secondary | ICD-10-CM | POA: Diagnosis not present

## 2018-04-03 DIAGNOSIS — I361 Nonrheumatic tricuspid (valve) insufficiency: Secondary | ICD-10-CM

## 2018-04-03 DIAGNOSIS — B1920 Unspecified viral hepatitis C without hepatic coma: Secondary | ICD-10-CM | POA: Diagnosis present

## 2018-04-03 DIAGNOSIS — J9621 Acute and chronic respiratory failure with hypoxia: Secondary | ICD-10-CM | POA: Diagnosis present

## 2018-04-03 DIAGNOSIS — F1010 Alcohol abuse, uncomplicated: Secondary | ICD-10-CM | POA: Diagnosis present

## 2018-04-03 DIAGNOSIS — F17210 Nicotine dependence, cigarettes, uncomplicated: Secondary | ICD-10-CM | POA: Diagnosis present

## 2018-04-03 DIAGNOSIS — Z90710 Acquired absence of both cervix and uterus: Secondary | ICD-10-CM | POA: Diagnosis not present

## 2018-04-03 DIAGNOSIS — Z992 Dependence on renal dialysis: Secondary | ICD-10-CM | POA: Diagnosis not present

## 2018-04-03 LAB — GLUCOSE, CAPILLARY
Glucose-Capillary: 189 mg/dL — ABNORMAL HIGH (ref 70–99)
Glucose-Capillary: 212 mg/dL — ABNORMAL HIGH (ref 70–99)
Glucose-Capillary: 226 mg/dL — ABNORMAL HIGH (ref 70–99)
Glucose-Capillary: 259 mg/dL — ABNORMAL HIGH (ref 70–99)

## 2018-04-03 LAB — BASIC METABOLIC PANEL
ANION GAP: 16 — AB (ref 5–15)
BUN: 41 mg/dL — AB (ref 8–23)
CHLORIDE: 101 mmol/L (ref 98–111)
CO2: 24 mmol/L (ref 22–32)
Calcium: 7.8 mg/dL — ABNORMAL LOW (ref 8.9–10.3)
Creatinine, Ser: 5.11 mg/dL — ABNORMAL HIGH (ref 0.44–1.00)
GFR calc non Af Amer: 8 mL/min — ABNORMAL LOW (ref >60)
GFR, EST AFRICAN AMERICAN: 9 mL/min — AB (ref >60)
GLUCOSE: 201 mg/dL — AB (ref 70–99)
POTASSIUM: 4 mmol/L (ref 3.5–5.1)
Sodium: 141 mmol/L (ref 135–145)

## 2018-04-03 LAB — CBC
HEMATOCRIT: 25 % — AB (ref 36.0–46.0)
Hemoglobin: 7.8 g/dL — ABNORMAL LOW (ref 12.0–15.0)
MCH: 26.6 pg (ref 26.0–34.0)
MCHC: 31.2 g/dL (ref 30.0–36.0)
MCV: 85.3 fL (ref 78.0–100.0)
Platelets: 102 10*3/uL — ABNORMAL LOW (ref 150–400)
RBC: 2.93 MIL/uL — ABNORMAL LOW (ref 3.87–5.11)
RDW: 19.6 % — AB (ref 11.5–15.5)
WBC: 9 10*3/uL (ref 4.0–10.5)

## 2018-04-03 LAB — ECHOCARDIOGRAM COMPLETE
HEIGHTINCHES: 61 in
WEIGHTICAEL: 2465.62 [oz_av]

## 2018-04-03 LAB — MRSA PCR SCREENING: MRSA by PCR: NEGATIVE

## 2018-04-03 LAB — TROPONIN I: Troponin I: 0.15 ng/mL (ref <0.03)

## 2018-04-03 LAB — PROCALCITONIN: Procalcitonin: 0.29 ng/mL

## 2018-04-03 MED ORDER — CYCLOPHOSPHAMIDE 50 MG PO CAPS
50.0000 mg | ORAL_CAPSULE | Freq: Every day | ORAL | Status: DC
  Administered 2018-04-03: 50 mg via ORAL
  Filled 2018-04-03 (×2): qty 1

## 2018-04-03 MED ORDER — TRAMADOL HCL 50 MG PO TABS
50.0000 mg | ORAL_TABLET | Freq: Two times a day (BID) | ORAL | Status: DC | PRN
  Administered 2018-04-03 (×2): 50 mg via ORAL
  Filled 2018-04-03 (×2): qty 1

## 2018-04-03 MED ORDER — CHLORHEXIDINE GLUCONATE CLOTH 2 % EX PADS: 6.0000 | MEDICATED_PAD | Freq: Every day | CUTANEOUS | Status: DC

## 2018-04-03 MED ORDER — ALBUTEROL SULFATE (2.5 MG/3ML) 0.083% IN NEBU
2.5000 mg | INHALATION_SOLUTION | Freq: Two times a day (BID) | RESPIRATORY_TRACT | Status: DC
  Administered 2018-04-03: 2.5 mg via RESPIRATORY_TRACT
  Filled 2018-04-03 (×2): qty 3

## 2018-04-03 NOTE — Significant Event (Addendum)
Rapid Response Event Note  Overview: Time Called: 2103 Arrival Time: 2105 Event Type: Cardiac  Initial Focused Assessment: Upon arrival, pt alert and anxious in bed with HR 227, BP 108/77, RR 36, spO2 96% on 4L [place]. Breath sounds clear/diminished bilaterally. Pt diaphoretic and c/o of being hot while attempting to remove her gown. Code Blue team at bedside. Pt did not lose consciousness, pulse, or stop breathing. Dialysis had been stopped by HD RN upon noticing increased HR around 2103  (Pt was on dialysis for a total of 2 hours 64min and 1593ml total pulled off).   Interventions: Zoll pads placed on patient. New PIV started in R shoulder by IV team. 6mg  Adenosine given x1, pt converted to SR with rate 70-80's. BP 191/52. Labs sent. Pt A&O stating she feels "much better". NP Baltazar Najjar called and informed of event. Came to bedside to evaluate pt.   Plan of Care (if not transferred): Stop dialysis for the night. Pt transferred to assigned room on 2W with stepdown orders. Continue to monitor pt HR and vitals.    Event Summary: Name of Physician Notified: Kirby-NP at 2127  Outcome: Transferred (Comment)(pt transferred from HD to assigned room on 2W)  Event End Time: 2145   2300: Upon rounding on pt, she was resting comfortably in bed, VSS with HR in the 80s. Pt states she feels better and is requesting something to eat. Will continue to monitor pt. RN notified to call with any questions or concerns.   Sherilyn Dacosta

## 2018-04-03 NOTE — Progress Notes (Addendum)
PROGRESS NOTE    Rhonda Lynch  NWG:[id number] DOB: 1956-02-06 DOA: 04/02/2018 PCP: Patient, No Pcp Per    Brief Narrative:  Rhonda Lynch is a 62 y.o. female with medical history significant for cocaine and EtOH abuse. End-stage renal disease Tuesday Thursday Saturday dialysis patient as well as plasmapheresis has been started on steroids and cyclophosphamide, Streptococcus pneumonia, BOOP, since emergency department with the chief complaint shortness of breath. Initial evaluation reveals acute respiratory failure likely related to pulmonary edema secondary to missing dialysis. [facility]ists are asked to admit  Information is obtained from the patient and the chart. She was discharged 9 days ago after a 5 day hospital stay for same. During that hospitalization her prednisone was increased by pulmonology. He was discharged on home oxygen.patient states she was unable to go to dialysis yesterday due to transportation issues. She states last night she became quickly short of breath. Associated symptoms include chest "pressure". She states this pressure is located left anterior nonradiating. She denies diaphoresis nausea vomiting. She denies headache dizziness syncope or near-syncope. She denies abdominal pain nausea vomiting diarrhea. She reports she continues to make some urine but has no dysuria or hematuria. He reports compliance with her medications. EMS was called and patient was found hypoxic. Reportedly EMS initiated BiPAP.    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  Admitted with worsening  dyspnea, hypoxemia, requiring BIPAP. Missed one day of HD. Report feeling with worsening dyspnea since Monday.  Off BIPAP today , on 4 L. Not ata baseline. Still with dyspnea, tachypnea. Events from last night notice, arrhythmia.  Had HD 5-78, complicated by SVT , HR in the 200 range. Discussed case with Dr Melvia Heaps, he recommend pulmonology consultation for further evaluation of Bilateral lungs changes  And will get CT chest.  CCM consulted.   2-SVT, 200;  While getting HD 7-24. Code blue called, but patinet never lost pulse or stop breathing.  Received adenosine.  Mild elevation of troponin.  ECHO ordered.  Cardiology consulted.   3-ESRD; HD T, Th, S Recent diagnosis of AKI was secondary to MPO positive rapidly progressive glomerulonephritis and subsequently progression to ESRD now on HD>  Nephrology following.  Had HD 7-24, but didn't completed 4 hours due to SVT episode.  On cytoxan. Follow nephrology recommendation for discontinuation of cytoxan.   4-BOOP; was discharge on prednisone.  Recurrent hypoxemia.  CCM consulted.   5-DM Recent hemoglobin A1c 6.8%.  Continue with lantus.   6-[place] use disorder.  UDS positive for cocaine. Prior UDS was negative  Counseling.   Anemia, thrombocytopenia; hold heparin.  Repeat labs in am.  Iron at 114 on 7-13.  Check B12. Defer transfusion to renal and during HD, to avoid pulmonary edema. Repeat labs in am.   DVT prophylaxis:Heparin  Code Status: Full code.  Family Communication: care discussed with patient.  Disposition Plan: home at time of discharge  Consultants:   [place]  Cardiology  Nephrology    Procedures:   ECHO; ordered.    Antimicrobials: Bactrim Three times a week. On bactrim ppx with immunosuppression.  Subjective: She is   Objective: Vitals:   04/03/18 0018 04/03/18 0101 04/03/18 0326 04/03/18 0749  BP: (!) 143/101  134/74 (!) 147/90  Pulse: 76  84 96  Resp: 14  13 13   Temp:   98.6 F (37 C)  99.3 F (37.4 C)  TempSrc:   Oral Oral  SpO2: 97% 97% 100% 100%  Weight:   69.9 kg (154 lb 1.6 oz)   Height:        Intake/Output Summary (Last 24 hours) at 04/03/2018 0848 Last data filed at 04/03/2018 0500 Gross per 24 hour  Intake 250 ml  Output 1757 ml  Net -1507 ml   Filed Weights   04/02/18 1840 04/02/18 2114 04/03/18 0326  Weight: 69.4 kg (153 lb) 67.7 kg (149 lb 4 oz) 69.9 kg (154 lb 1.6 oz)    Examination:  General exam: Appears calm and comfortable  Respiratory system: mild tachypnea, bilateral crackles.  Cardiovascular system: S1 & S2 heard, RRR. No JVD, murmurs, rubs, gallops or clicks. No pedal edema. Gastrointestinal system: Abdomen is nondistended, soft and nontender. No organomegaly or masses felt. Normal bowel sounds heard. Central nervous system: Alert and oriented. No focal neurological deficits. Extremities: Symmetric 5 x 5 power. Skin: No rashes, lesions or ulcers   Data Reviewed: I have personally reviewed following labs and imaging studies  CBC: Recent Labs  Lab 03/27/18 1107 04/02/18 1445 04/02/18 2133 04/03/18 0236  WBC 8.1 11.0* 10.1 9.0  NEUTROABS 7.3*  --   --   --   HGB 8.0* 8.8* 9.0* 7.8*  HCT 25.3* 28.3* 28.9* 25.0*  MCV 83 86.5 84.8 85.3  PLT 126* 132* 122* 932*   Basic Metabolic Panel: Recent Labs  Lab 04/02/18 1445 04/02/18 2133 04/03/18 0236  NA 143 141 141  K 4.4 3.2* 4.0  CL 105 101 101  CO2 21* 22 24  GLUCOSE 315* 208* 201*  BUN 68* 29* 41*  CREATININE 7.37* 3.88* 5.11*  CALCIUM 8.0* 8.2* 7.8*  PHOS  --  4.3  --    GFR: Estimated Creatinine Clearance: 9.9 mL/min (A) (by C-G formula based on SCr of 5.11 mg/dL (H)). Liver Function Tests: Recent Labs  Lab 04/02/18 1445  AST 25  ALT 28  ALKPHOS 69  BILITOT 1.0  PROT 5.6*  ALBUMIN 3.1*   No results for input(s): LIPASE, AMYLASE in the last 168 hours. No results for input(s): AMMONIA in the last 168 hours. Coagulation Profile: No results for input(s): INR,  PROTIME in the last 168 hours. Cardiac Enzymes: Recent Labs  Lab 04/02/18 1445 04/02/18 1818 04/02/18 2134 04/03/18 0236  TROPONINI 0.20* 0.24* 0.19* 0.15*   BNP (last 3 results) No results for input(s): PROBNP in the last 8760 hours. HbA1C: Recent Labs    04/02/18 1445  HGBA1C 6.8*   CBG: Recent Labs  Lab 04/02/18 1642 04/02/18 1828 04/02/18 2220 04/03/18 0747  GLUCAP 285* 297* 196* 189*   Lipid Profile: No results for input(s): CHOL, HDL, LDLCALC, TRIG, CHOLHDL, LDLDIRECT in the last 72 hours. Thyroid Function Tests: No results for input(s): TSH, T4TOTAL, FREET4, T3FREE, THYROIDAB in the last 72 hours. Anemia Panel: No results for input(s): VITAMINB12, FOLATE, FERRITIN, TIBC, IRON, RETICCTPCT in the last 72 hours. Sepsis Labs: No results for input(s): PROCALCITON, LATICACIDVEN in the last 168 hours.  Recent Results (from the past 240 hour(s))  MRSA PCR Screening     Status: None   Collection Time: 04/02/18 10:15 PM  Result Value Ref Range Status  MRSA by PCR NEGATIVE NEGATIVE Final    Comment:        The GeneXpert MRSA Assay (FDA approved for NASAL specimens only), is one component of a comprehensive MRSA colonization surveillance program. It is not intended to diagnose MRSA infection nor to guide or monitor treatment for MRSA infections. Performed at [facility], [place] [address], [place], [place] [address]          Radiology Studies: Dg Chest Port 1 View  Result Date: 04/02/2018 CLINICAL DATA:  Shortness of breath. EXAM: PORTABLE CHEST 1 VIEW COMPARISON:  Radiographs of March 23, 2018. FINDINGS: Stable cardiomegaly. Atherosclerosis of thoracic aorta is noted. Stable diffuse reticular densities are noted throughout both lungs which may represent chronic interstitial lung disease or scarring, but acute superimposed edema or inflammation cannot be excluded. No pneumothorax or significant pleural effusion is noted. Right internal jugular dialysis  catheter is unchanged in position. Bony thorax is unremarkable. IMPRESSION: Stable bilateral diffuse reticular lung opacities are noted which may represent chronic interstitial lung disease or scarring, but acute superimposed edema or inflammation cannot be excluded. Electronically Signed   By: Marijo Conception, M.D.   On: 04/02/2018 14:56        Scheduled Meds: . albuterol  2.5 mg Nebulization Q6H  . Chlorhexidine Gluconate Cloth  6 each Topical Q0600  . heparin  5,000 Units Subcutaneous Q8H  . insulin aspart  0-5 Units Subcutaneous QHS  . insulin aspart  0-9 Units Subcutaneous TID WC  . insulin glargine  5 Units Subcutaneous Daily  . nitroGLYCERIN  1 inch Topical Q6H  . predniSONE  40 mg Oral Q breakfast  . sodium chloride flush  3 mL Intravenous Q12H  . sulfamethoxazole-trimethoprim  1 tablet Oral Q T,Th,Sat-1800   Continuous Infusions: . sodium chloride    . sodium chloride       LOS: 0 days    Time spent: 35 minutes.     Elmarie Shiley, MD [facility] Pager [phone]  If 7PM-7AM, please contact night-coverage [email or web address] Password [facility] 04/03/2018, 8:48 AM

## 2018-04-03 NOTE — Progress Notes (Signed)
RT asked pt when she wanted to be placed on BIPAP and pt stated she does not need it. RT encouraged pt to utilize BIPAP to help her rest better. Pt still declined. States she is not planning on wearing it at all.

## 2018-04-03 NOTE — Care Management Note (Addendum)
Case Management Note  Patient Details  Name: Rhonda Lynch MRN: [id number] Date of Birth: 1956-02-09  Subjective/Objective:   From home with son and daughter, presents with fluid overload after missing HD, came to ED , went to [facility] and HR went into  220's, became sob, code was called.  She is established with [facility] she has follow up apt on 8/13 at 11:10 am.  She has home oxygen with [facility] and HHPT with [facility], she also has a rollator. She utilized the [employer] on previous admission.    Per Butch Penny with [facility] patient was charity for [place] , but they could never get in touch with her by phone or at the address so they never saw her.Patient states her phone number is [phone], NCM gave this to Butch Penny she states they had a different phone number. Butch Penny will re qualify her for charity for HHPT,  NCM spoke with MD to put order in for HHPT and to print out her scripts so she can go to [place] [facility].  Patient states she will have her oxygen brought here for her to be transported back home with.               Action/Plan: NCM will follow for transition of care needs.  Expected Discharge Date:  04/09/18               Expected Discharge Plan:  [place]  In-House Referral:     Discharge planning Services  CM Consult, [facility], Medication Assistance  Post Acute Care Choice:  Resumption of Svcs/PTA Provider Choice offered to:     DME Arranged:    DME Agency:     HH Arranged:  PT HH Agency:     Status of Service:  In process, will continue to follow  If discussed at Long Length of Stay Meetings, dates discussed:    Additional Comments:  Zenon Mayo, RN 04/03/2018, 10:58 AM

## 2018-04-03 NOTE — Progress Notes (Signed)
  Echocardiogram 2D Echocardiogram has been performed.  Rhonda Lynch L Androw 04/03/2018, 3:06 PM

## 2018-04-03 NOTE — Consult Note (Addendum)
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 She initially required bipap.  She was dialyzed with 2.5L removed and transitioned off bipap but remained on 4L with some concern that respiratory failure was multifactorial and PCCM consulted for further assistance.   SIGNIFICANT EVENTS    STUDIES:  CT chest 7/25>>> 1. There has been significant improvement since the prior chest CT. The bilateral areas of ground-glass lung opacification have improved. Underlying mild bronchiectasis is stable. The findings have upper lung predominance. No new lung abnormalities. 2. Small, right greater than left, pleural effusions have increased in size from prior CT. 3. Findings described may reflect improved pulmonary edema or could be due to improved cryptogenic organizing pneumonia or other interstitial lung disease.   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 She initially required bipap.  She was dialyzed with 2.5L removed and transitioned off bipap but remained on 4L with some concern that respiratory failure was multifactorial and PCCM consulted for further assistance.   States she began feeling SOB Monday night, then missed HD on Tuesday.   Denies chest pain, fever, chills, purulent sputum, recent travel or sick contacts, leg/calf pain.  Takes prednisone 40mg  daily maintenance  *UDS POS for cocaine   Currently feeling better. Down to 4-5L [place], wears 2-3L at home.   PAST  MEDICAL HISTORY :   has a past medical history of Chronic kidney disease, ESRD (end stage renal disease) ([place]), Hypertension, Oxygen deficiency, and Substance abuse ([place]).  has a past surgical history that includes IR Fluoro Guide CV Line Right (02/18/2018); IR US Guide Vasc Access Right (02/18/2018); IR Fluoro Guide CV Line Right (02/26/2018); AV fistula placement (Left, 03/05/2018); and Hematoma evacuation (Left, 03/06/2018). Prior to Admission medications   Medication Sig Start Date End Date Taking? Authorizing Provider  cyclophosphamide (CYTOXAN) 50 MG capsule Take 1 capsule (50 mg total) by mouth daily. Give on an empty stomach 1 hour before or 2 hours after meals. 03/25/18  Yes Clent Demark, PA-C  famotidine (PEPCID) 20 MG tablet Take 1 tablet (20 mg total) by mouth daily. 03/16/18  Yes Elgergawy, Silver Huguenin, MD  Insulin Glargine (LANTUS) 100 UNIT/ML Solostar Pen Inject 5 Units into the skin at bedtime. Patient taking differently: Inject 5 Units into the skin daily.  03/25/18  Yes Clent Demark, PA-C  Multiple Vitamin (MULTIVITAMIN WITH MINERALS) TABS tablet Take 1 tablet by mouth daily. 03/16/18  Yes Elgergawy, Silver Huguenin, MD  predniSONE (DELTASONE) 20 MG tablet Take 2 tablets (40 mg total) by mouth daily with breakfast. 03/25/18  Yes Clent Demark, PA-C  sulfamethoxazole-trimethoprim (BACTRIM DS,SEPTRA DS) 800-160 MG tablet Take 1 tablet by mouth 3 (three) times a week. Patient taking differently: Take 1 tablet by mouth every Tuesday, Thursday, and Saturday at 6 PM.  03/17/18  Yes Elgergawy, Silver Huguenin, MD  traMADol (ULTRAM) 50 MG tablet Take 1 tablet (50 mg total) by mouth every 12 (twelve) hours as needed (refractory pain). 03/24/18  Yes Patrecia Pour,  MD  Insulin Pen Needle 31G X 5 MM MISC Use for once daily injections 03/25/18   Clent Demark, PA-C  thiamine 100 MG tablet Take 1 tablet (100 mg total) by mouth daily. 03/25/18   Clent Demark, PA-C   No Known Allergies  FAMILY  HISTORY:  family history is not on file. SOCIAL HISTORY:  reports that she has been smoking.  She has been smoking about 0.50 packs per day. She has never used smokeless tobacco. She reports that she drinks alcohol. She reports that she has current or past drug history. Drug: Cocaine.  REVIEW OF SYSTEMS:   As per HPI - All other systems reviewed and were neg.    SUBJECTIVE:   VITAL SIGNS: Temp:  [97.8 F (36.6 C)-99.3 F (37.4 C)] 99.3 F (37.4 C) (07/25 0749) Pulse Rate:  [64-227] 96 (07/25 0749) Resp:  [12-42] 13 (07/25 0749) BP: (102-191)/(52-170) 147/90 (07/25 0749) SpO2:  [93 %-100 %] 100 % (07/25 0906) FiO2 (%):  [30 %] 30 % (07/24 1804) Weight:  [67.7 kg (149 lb 4 oz)-69.9 kg (154 lb 1.6 oz)] 69.9 kg (154 lb 1.6 oz) (07/25 0326)  PHYSICAL EXAMINATION: General:  Pleasant female, NAD  Neuro:  Sleeping but easily arousable, appropriate, MAE  HEENT:  Mm moist, no JVD, poor dentition  Cardiovascular:  s1s2 rrr Lungs:  resps even non labored on [place], bibasilar crackles, few scattered rhonchi  Abdomen:  Soft, +bs, non tender  Musculoskeletal:  Warm and dry, no edema   Recent Labs  Lab 04/02/18 1445 04/02/18 2133 04/03/18 0236  NA 143 141 141  K 4.4 3.2* 4.0  CL 105 101 101  CO2 21* 22 24  BUN 68* 29* 41*  CREATININE 7.37* 3.88* 5.11*  GLUCOSE 315* 208* 201*   Recent Labs  Lab 04/02/18 1445 04/02/18 2133 04/03/18 0236  HGB 8.8* 9.0* 7.8*  HCT 28.3* 28.9* 25.0*  WBC 11.0* 10.1 9.0  PLT 132* 122* 102*   Dg Chest Port 1 View  Result Date: 04/03/2018 CLINICAL DATA:  Hypoxemia.  History of end-stage renal disease. EXAM: PORTABLE CHEST 1 VIEW COMPARISON:  04/02/2018 and older exams. FINDINGS: Since the previous day's study, interstitial opacities are similar. Hazy intervening airspace opacities have improved. Hemidiaphragms are better defined. Findings are consistent with improved pulmonary edema reduced small pleural effusions. No new lung abnormalities. No  pneumothorax. Cardiac silhouette borderline enlarged. Right internal jugular dual-lumen central venous catheter is stable. IMPRESSION: 1. Improved pulmonary edema when compared to the previous day's study. No new abnormalities. Electronically Signed   By: Lajean Manes M.D.   On: 04/03/2018 09:12   Dg Chest Port 1 View  Result Date: 04/02/2018 CLINICAL DATA:  Shortness of breath. EXAM: PORTABLE CHEST 1 VIEW COMPARISON:  Radiographs of March 23, 2018. FINDINGS: Stable cardiomegaly. Atherosclerosis of thoracic aorta is noted. Stable diffuse reticular densities are noted throughout both lungs which may represent chronic interstitial lung disease or scarring, but acute superimposed edema or inflammation cannot be excluded. No pneumothorax or significant pleural effusion is noted. Right internal jugular dialysis catheter is unchanged in position. Bony thorax is unremarkable. IMPRESSION: Stable bilateral diffuse reticular lung opacities are noted which may represent chronic interstitial lung disease or scarring, but acute superimposed edema or inflammation cannot be excluded. Electronically Signed   By: Marijo Conception, M.D.   On: 04/02/2018 14:56    ASSESSMENT / PLAN:  Acute on chronic respiratory failure in setting pulmonary edema after missed HD c/b cocaine abuse with  hx BOOP and ANCA vasculitis on chronic steroids/cytoxan.  Already much improved with volume removal/HD. Does not appear infected. CT chest improved.   PLAN -  Continue attempts at volume removal with HD  Continue steroids - do not taper  Continue bactrim  Holding cytoxan per renal  BD's  F/u CXR  Check procalcitonin  Substance abuse counseling and smoking cessation!  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  Patient reported that she started feeling shortness of breath 1 day prior to missing her dialysis.  On exam, mild rales continue to exist.  I reviewed chest CT and prior CT myself, improvement since dialysis and the new CT remains to be consistent with pulmonary edema.  Discussed with nephrology and PCCM-NP.  Hypoxemia: likely a combination of pulmonary edema and ?of crack lungs with bronchiectasis, now down to 4L [place].  - Diureses via dialysis as able  - Substance abuse cessation counseling.   - Titrate O2 for sat of 88-92%  GGO opacification: ?crack lungs vs worsening pneumonitis  - Cocaine cessation  - Continue steroids  - F/U imaging as outpatient  Pulmonary infiltrate: doubt infections likely pneumonitis  - Steroids as ordered, no taper  - Ok to stop immune modulating agents  - PCT, if positive will consider coverage for CAP  BOOP by history:  - Steroids without taper  - F/U imaging and f/u as outpatient  Substance abuse:  - Cocaine cessation  - Smoking cessation  PCCM will sign off, please call back if needed.  Patient seen and examined, agree with above note.  I dictated the care and orders written for this patient under my direction.  Rush Farmer, [place]

## 2018-04-03 NOTE — Consult Note (Addendum)
Cardiology Consult    [name] MRN: [id number], DOB/AGE: 64-Dec-1957   Admit date: 04/02/2018 Date of Consult: 04/03/2018  Primary Physician: Patient, No Pcp Per Primary Cardiologist: New Requesting Provider: Dr. Tyrell Antonio Reason for Consultation: SVT  Rhonda Lynch is a 62 y.o. female who is being seen today for the evaluation of SVT at the request of Dr. Tyrell Antonio.   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 Developed SVT while in HD.   Past Medical History   Past Medical History:  Diagnosis Date  . Chronic kidney disease   . ESRD (end stage renal disease) ([place])   . Hypertension   . Oxygen deficiency    2 L at night  . Substance abuse ([place])    has been clean for 4 months     Past Surgical History:  Procedure Laterality Date  . AV FISTULA PLACEMENT Left 03/05/2018   Procedure: ARTERIOVENOUS (AV) FISTULA CREATION  LEFT UPPER EXTREMITY;  Surgeon: [name], MD;  Location: [place];  Service: Vascular;  Laterality: Left;  . HEMATOMA EVACUATION Left 03/06/2018   Procedure: EVACUATION HEMATOMA LEFT ARM;  Surgeon: Angelia Mould, MD;  Location: [place];  Service: Vascular;  Laterality: Left;  . IR FLUORO GUIDE CV LINE RIGHT  02/18/2018  . IR FLUORO GUIDE CV LINE RIGHT  02/26/2018  . IR US GUIDE VASC ACCESS RIGHT  02/18/2018     Allergies  No Known Allergies  History of Present Illness    Rhonda Lynch is a 62 year old female with past medical history of hypertension, EtOH abuse, cocaine abuse, ESRD on HD, BOOP.  She has had several recent admissions over the past 2 months.  Last admission was on 03/19/2018 for shortness of breath and was treated for Boop pneumonia, and seen by nephrology and pulmonology during that admission.  Was placed on steroid taper and discharged home.  She also reports being started on hemodialysis about 2  months ago.  She is currently on a Tuesday Thursday Saturday schedule.  Reports she was last dialyzed on Saturday, and began to feel unwell on Monday.  States she became increasingly short of breath and noted to have increased lower extremity edema.  Reports missing her dialysis session on Tuesday and her symptoms became worse.  She denies any chest pain, or palpitations prior to admission.  She presented to the ED on 7/24 with worsening respiratory distress.  On EMS arrival was noted to be hypoxic and was placed on CPAP, then transferred to BiPAP on arrival to the ED.  Labs showed stable electrolytes, creatinine 7.37, troponin 0.20>> 0.24>> 0.19, hemoglobin 8.8.  Initial EKG with artifact and poor baseline but showed sinus tachycardia.  Follow-up EKG showed sinus rhythm with LVH and T wave inversion in inferior lateral leads, which was not noted on EKG from 03/19/2018. She reports not having used any illicit drugs, but her UDS was positive for cocaine. She was seen by nephrology, and taken up for dialysis early this a.m.  Reportedly after being in dialysis for 2 hours converted to SVT with a rate of 220, and a CODE BLUE was called.  Rapid response responded to the patient's bedside, and she was given adenosine 6mg  x1 with conversion to SR. She never lost a pulse during this time. Patient reports the events are hazy but she does not recall any chest pain  prior to or after this episode. In review of telemetry I'm unable to view this event, but there is a time gap noted last evening. Review of telemetry shows mostly SR with few episodes of PVCs. She was able to wean from Bipap to [facility] and actually feeling much today after having HD.   Inpatient Medications    . albuterol  2.5 mg Nebulization BID  . Chlorhexidine Gluconate Cloth  6 each Topical Q0600  . cyclophosphamide  50 mg Oral Daily  . heparin  5,000 Units Subcutaneous Q8H  . insulin aspart  0-5 Units Subcutaneous QHS  . insulin aspart  0-9 Units  Subcutaneous TID WC  . insulin glargine  5 Units Subcutaneous Daily  . predniSONE  40 mg Oral Q breakfast  . sodium chloride flush  3 mL Intravenous Q12H  . sulfamethoxazole-trimethoprim  1 tablet Oral Q T,Th,Sat-1800    Family History    Family History  Problem Relation Age of Onset  . Autoimmune disease Neg Hx     Social History    Social History   Socioeconomic History  . Marital status: Married    Spouse name: Not on file  . Number of children: Not on file  . Years of education: Not on file  . Highest education level: Not on file  Occupational History  . Not on file  Social Needs  . Financial resource strain: Not on file  . Food insecurity:    Worry: Not on file    Inability: Not on file  . Transportation needs:    Medical: Not on file    Non-medical: Not on file  Tobacco Use  . Smoking status: Current Every Day Smoker    Packs/day: 0.50  . Smokeless tobacco: Never Used  . Tobacco comment: trying to quit 04/02/2018  Substance and Sexual Activity  . Alcohol use: Yes  . Drug use: Not Currently    Types: Cocaine  . Sexual activity: Not Currently    Birth control/protection: None  Lifestyle  . Physical activity:    Days per week: Not on file    Minutes per session: Not on file  . Stress: Not on file  Relationships  . Social connections:    Talks on phone: Not on file    Gets together: Not on file    Attends religious service: Not on file    Active member of club or organization: Not on file    Attends meetings of clubs or organizations: Not on file    Relationship status: Not on file  . Intimate partner violence:    Fear of current or ex partner: Not on file    Emotionally abused: Not on file    Physically abused: Not on file    Forced sexual activity: Not on file  Other Topics Concern  . Not on file  Social History Narrative  . Not on file     Review of Systems    See HPI  All other systems reviewed and are otherwise negative except as noted  above.  Physical Exam    Blood pressure 139/88, pulse 91, temperature 99.2 F (37.3 C), temperature source Oral, resp. rate (!) 26, height 5\' 1"  (1.549 m), weight 154 lb 1.6 oz (69.9 kg), SpO2 99 %.  General: Pleasant, older obese AAF NAD Psych: Normal affect. Neuro: Alert and oriented X 3. Moves all extremities spontaneously. HEENT: Normal  Neck: Supple, no JVD. Lungs:  Resp regular and unlabored, CTA. Heart: RRR no s3, s4, or  murmurs. HD cath noted to right upper chest.  Abdomen: Soft, non-tender, non-distended, BS + x 4.  Extremities: No clubbing, cyanosis 1+ LE edema R>L. DP/PT/Radials 2+ and equal bilaterally.  Labs    Troponin (Point of Care Test) No results for input(s): TROPIPOC in the last 72 hours. Recent Labs    04/02/18 1445 04/02/18 1818 04/02/18 2134 04/03/18 0236  TROPONINI 0.20* 0.24* 0.19* 0.15*   Lab Results  Component Value Date   WBC 9.0 04/03/2018   HGB 7.8 (L) 04/03/2018   HCT 25.0 (L) 04/03/2018   MCV 85.3 04/03/2018   PLT 102 (L) 04/03/2018    Recent Labs  Lab 04/02/18 1445  04/03/18 0236  NA 143   < > 141  K 4.4   < > 4.0  CL 105   < > 101  CO2 21*   < > 24  BUN 68*   < > 41*  CREATININE 7.37*   < > 5.11*  CALCIUM 8.0*   < > 7.8*  PROT 5.6*  --   --   BILITOT 1.0  --   --   ALKPHOS 69  --   --   ALT 28  --   --   AST 25  --   --   GLUCOSE 315*   < > 201*   < > = values in this interval not displayed.   Lab Results  Component Value Date   CHOL  02/20/2010    129        ATP III CLASSIFICATION:  <200     mg/dL   Desirable  200-239  mg/dL   Borderline High  >=240    mg/dL   High          HDL 38 (L) 02/20/2010   LDLCALC  02/20/2010    75        Total Cholesterol/HDL:CHD Risk Coronary Heart Disease Risk Table                     Men   Women  1/2 Average Risk   3.4   3.3  Average Risk       5.0   4.4  2 X Average Risk   9.6   7.1  3 X Average Risk  23.4   11.0        Use the calculated Patient Ratio above and the CHD Risk  Table to determine the patient's CHD Risk.        ATP III CLASSIFICATION (LDL):  <100     mg/dL   Optimal  100-129  mg/dL   Near or Above                    Optimal  130-159  mg/dL   Borderline  160-189  mg/dL   High  >190     mg/dL   Very High   TRIG 82 02/20/2010   No results found for: [facility]   Radiology Studies    Ct Abdomen Pelvis Wo Contrast  Result Date: 03/19/2018 CLINICAL DATA:  62 year old female with reported kidney failure, recently discharged for pneumonia presents with abdominal pain. EXAM: CT ABDOMEN AND PELVIS WITHOUT CONTRAST TECHNIQUE: Multidetector CT imaging of the abdomen and pelvis was performed following the standard protocol without IV contrast. COMPARISON:  02/12/2018 FINDINGS: Lower chest: Cardiomegaly with small bilateral right greater than left pleural effusions. Redemonstration of bilateral bibasilar ground-glass opacities with inter and intra lobular septal thickening in a crazy paving  pattern that can be seen with cryptogenic organizing pneumonia. Superimposed stigmata of CHF given presence of bilateral pleural effusions is not excluded. Hepatobiliary: The unenhanced liver is normal given limitations of a noncontrast study. The patient is status post cholecystectomy. Pancreas: Normal Spleen: Normal Adrenals/Urinary Tract: Mild bilateral perinephric fat stranding without obstructive uropathy. The urinary bladder is unremarkable for degree of distention. Stomach/Bowel: The stomach is decompressed. Normal small bowel rotation without obstruction or inflammation. The distal and terminal ileum and appendix are normal. Unremarkable appearance of the colon with average amount of fecal retention noted. Vascular/Lymphatic: Moderate aorto iliac atherosclerosis without aneurysm. No adenopathy. Reproductive: Hysterectomy. Small fat containing dermoid in the left ovary measuring 15 mm, stable in appearance. Other: No free air nor free fluid. Small fat containing umbilical hernia.  Musculoskeletal: No acute or significant osseous findings. Minimal anterolisthesis grade 1 of L4 on L5 due to facet arthropathy. IMPRESSION: 1. Bibasilar ground-glass opacities with interlobular and intralobular septal thickening sparing the subpleural lung. Findings can be seen in cryptogenic organizing pneumonia. Superimposed pulmonary edema would be difficult to entirely exclude given presence of new bilateral small pleural effusions, right greater than left. 2. 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 3. Minimal anterolisthesis grade 1 of L4 on L5 secondary to degenerative facet arthropathy. Electronically Signed   By: Ashley Royalty M.D.   On: 03/19/2018 20:29   Dg Chest 2 View  Result Date: 03/23/2018 CLINICAL DATA:  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[place]) J84.89 (ICD-10-CM) Acute respiratory failure with hypoxia ([place]) J96.01 (ICD-10-CM). EXAM: CHEST - 2 VIEW COMPARISON:  03/19/2018 FINDINGS: Significant improvement in diffuse bilateral airspace disease. Mild residual bilateral airspace disease. Small left effusion. Cardiac enlargement. Right jugular central venous catheter tip in the lower SVC unchanged. IMPRESSION: Significant improvement in diffuse bilateral airspace disease which may be due to clearing pneumonia or edema. Electronically Signed   By: Franchot Gallo M.D.   On: 03/23/2018 08:40   Dg Chest 2 View  Result Date: 03/19/2018 CLINICAL DATA:  Abdominal pain.  Recent dialysis shunt placement. EXAM: CHEST - 2 VIEW COMPARISON:  03/14/2018 and 03/06/2018 FINDINGS: Cardiomegaly with mild aortic atherosclerosis. Diffuse interstitial edema persists with small bilateral pleural effusions. Right IJ dialysis catheter terminates at the cavoatrial junction and is unchanged in appearance. There is pneumothorax. No acute osseous abnormality. IMPRESSION: Stable cardiomegaly with mild aortic atherosclerosis. Diffuse interstitial edema is again noted without significant change. Small  bilateral pleural effusions. Electronically Signed   By: Ashley Royalty M.D.   On: 03/19/2018 17:03   Ct Chest Wo Contrast  Result Date: 04/03/2018 CLINICAL DATA:  Followup for presumed cryptogenic organizing pneumonia. EXAM: CT CHEST WITHOUT CONTRAST TECHNIQUE: Multidetector CT imaging of the chest was performed following the standard protocol without IV contrast. COMPARISON:  Current chest radiograph. Multiple prior chest radiographs. Chest CT 02/17/2018. FINDINGS: Cardiovascular: Heart is mildly enlarged. No pericardial effusion. No coronary artery calcifications. Great vessels normal in caliber. Mild aortic atherosclerosis. Mediastinum/Nodes: No neck base or axillary masses or enlarged lymph nodes. Normal thyroid. There are prominent mediastinal nodes, largest an AP window node measuring 13 mm in short axis, without significant change from the prior CT. Trachea is widely patent. Esophagus is unremarkable. Lungs/Pleura: Small, right greater than left, pleural effusions. Bilateral peribronchovascular ground-glass opacities are noted in all lobes. Mild bronchiectasis and centrilobular emphysema is noted in both lobes. The more extensive airspace opacities noted on the prior study have improved. No new lung abnormalities. No pneumothorax. Upper Abdomen: No acute abnormality. Musculoskeletal:  No fracture or acute finding. No osteoblastic or osteolytic lesions. IMPRESSION: 1. There has been significant improvement since the prior chest CT. The bilateral areas of ground-glass lung opacification have improved. Underlying mild bronchiectasis is stable. The findings have upper lung predominance. No new lung abnormalities. 2. Small, right greater than left, pleural effusions have increased in size from prior CT. 3. Findings described may reflect improved pulmonary edema or could be due to improved cryptogenic organizing pneumonia or other interstitial lung disease. Aortic Atherosclerosis (ICD10-I70.0). Electronically  Signed   By: Lajean Manes M.D.   On: 04/03/2018 12:40   Nm Pulmonary Vent And Perf (v/q Scan)  Result Date: 03/20/2018 CLINICAL DATA:  Shortness of Breath EXAM: NUCLEAR MEDICINE VENTILATION - PERFUSION LUNG SCAN VIEWS: Anterior, posterior, left lateral, right lateral, RPO, LPO, RAO, LAO-ventilation and perfusion RADIOPHARMACEUTICALS:  31.6 mCi of Tc-66m DTPA aerosol inhalation and 4.2 mCi Tc56m-MAA IV COMPARISON:  Chest radiograph March 19, 2018 FINDINGS: Ventilation: Radiotracer uptake is homogeneous and symmetric bilaterally. No ventilation defects are evident. Perfusion: Radiotracer uptake is homogeneous and symmetric bilaterally. No appreciable perfusion defects. IMPRESSION: No appreciable ventilation or perfusion defects. This study constitutes a very low probability of pulmonary embolus. Electronically Signed   By: Lowella Grip III M.D.   On: 03/20/2018 10:20   Dg Chest Port 1 View  Result Date: 04/03/2018 CLINICAL DATA:  Hypoxemia.  History of end-stage renal disease. EXAM: PORTABLE CHEST 1 VIEW COMPARISON:  04/02/2018 and older exams. FINDINGS: Since the previous day's study, interstitial opacities are similar. Hazy intervening airspace opacities have improved. Hemidiaphragms are better defined. Findings are consistent with improved pulmonary edema reduced small pleural effusions. No new lung abnormalities. No pneumothorax. Cardiac silhouette borderline enlarged. Right internal jugular dual-lumen central venous catheter is stable. IMPRESSION: 1. Improved pulmonary edema when compared to the previous day's study. No new abnormalities. Electronically Signed   By: Lajean Manes M.D.   On: 04/03/2018 09:12   Dg Chest Port 1 View  Result Date: 04/02/2018 CLINICAL DATA:  Shortness of breath. EXAM: PORTABLE CHEST 1 VIEW COMPARISON:  Radiographs of March 23, 2018. FINDINGS: Stable cardiomegaly. Atherosclerosis of thoracic aorta is noted. Stable diffuse reticular densities are noted throughout both  lungs which may represent chronic interstitial lung disease or scarring, but acute superimposed edema or inflammation cannot be excluded. No pneumothorax or significant pleural effusion is noted. Right internal jugular dialysis catheter is unchanged in position. Bony thorax is unremarkable. IMPRESSION: Stable bilateral diffuse reticular lung opacities are noted which may represent chronic interstitial lung disease or scarring, but acute superimposed edema or inflammation cannot be excluded. Electronically Signed   By: Marijo Conception, M.D.   On: 04/02/2018 14:56   Dg Chest Port 1 View  Result Date: 03/14/2018 CLINICAL DATA:  Fever EXAM: PORTABLE CHEST 1 VIEW COMPARISON:  March 06, 2018 FINDINGS: Central catheter tip is in the superior vena cava near the cavoatrial junction. No pneumothorax. There is mild interstitial edema diffusely with minimal pleural effusions bilaterally. No consolidation. Heart is mildly enlarged with pulmonary venous hypertension. No adenopathy. There is aortic atherosclerosis. No bone lesions. IMPRESSION: Central catheter as described without pneumothorax. Cardiomegaly with mild pulmonary venous hypertension consistent with a degree of pulmonary vascular congestion. Mild interstitial edema with minimal pleural effusions. No consolidation. There is aortic atherosclerosis. Aortic Atherosclerosis (ICD10-I70.0). Electronically Signed   By: Lowella Grip III M.D.   On: 03/14/2018 07:30   Dg Chest Port 1 View  Result Date: 03/06/2018 CLINICAL DATA:  Fever. EXAM: PORTABLE  CHEST 1 VIEW COMPARISON:  CT 02/17/2018.  Chest x-ray 02/14/2018. FINDINGS: Right IJ dual-lumen catheter with tip over cavoatrial junction. Cardiomegaly. No focal infiltrate. No pleural effusion or pneumothorax. No acute bony abnormality. J IMPRESSION: 1.  Right IJ dual-lumen catheter with tip over cavoatrial junction. 2. No acute cardiopulmonary disease. No evidence of pulmonary infiltrate. 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 Developed SVT while in HD.   1. SVT: Unable to locate strips confirming rhythm, but notes reviewed and patient reported to have converted to SR after adenosine 6mg  x1. She denies having any chest pain prior to or after event. Telemetry since that time shows mostly SR with few PVCs. UDS was also + for cocaine on admission. Suspect this was likely related to her missing HD in the setting of recent cocaine use. No further arrhythmias noted. EKG at 0024 shows SR with new TWI in inferolateral leads suspect this was after her conversion to SR. Echo pending per primary  2. Acute respiratory failure 2/2 volume overload: Initially placed on Bipap on admission but has been weaned to [place]. Had 1500cc removed with HD, and breathing has much improved, as well as LE edema.   3. ESRD on HD: Reports her last session was Saturday, and missed Tuesday because she didn't feel well. Had 1500cc removed, but session cut short 2/2 to cardiac event. Nephrology following.   4. BOOP: being followed as an outpatient by pulmonary. On steroid taper.  5. Hx of ETOH/Cocaine abuse: reportedly had been clean for several months but UDS was + for cocaine.   Rhonda Lynch, [place], NP-C Pager [phone] 04/03/2018, 1:19 PM  Agree with note by Reino Bellis NP-C  I have reviewed the database and examined the patient and agree with the assessment and plan by Harlan Stains nurse practitioner.  We are asked to see this 62 year old female because of an episode of PSVT which apparently converted with adenosine.  She has chronic renal insufficiency on dialysis.  She apparently missed a  dialysis session.  She also is a polysubstance abuser and her UDS was positive for cocaine.  Her lecture lites are otherwise unremarkable.  I suspect that her PSVT was related to volume overload plus the use of cocaine.  She is not a candidate for beta-blockade.  No further work-up is necessary at this time.  Lorretta Harp, M.D., [place], [facility], [place] [employer] [address] [place], [place]  [address]  [phone] 04/03/2018 2:36 PM   [facility] will sign off.   Medication Recommendations: None Other recommendations (labs, testing, etc): Counseled about the importance of compliance with dialysis and avoidance of cocaine Follow up as an outpatient: No follow-up required

## 2018-04-03 NOTE — Progress Notes (Signed)
[facility] Progress Note  Subjective: SOB better this am, having epigastric discomfort now  Vitals:   04/03/18 0101 04/03/18 0326 04/03/18 0749 04/03/18 0906  BP:  134/74 (!) 147/90   Pulse:  84 96   Resp:  13 13   Temp:  98.6 F (37 C) 99.3 F (37.4 C)   TempSrc:  Oral Oral   SpO2: 97% 100% 100% 100%  Weight:  69.9 kg (154 lb 1.6 oz)    Height:        Inpatient medications: . albuterol  2.5 mg Nebulization Q6H  . Chlorhexidine Gluconate Cloth  6 each Topical Q0600  . heparin  5,000 Units Subcutaneous Q8H  . insulin aspart  0-5 Units Subcutaneous QHS  . insulin aspart  0-9 Units Subcutaneous TID WC  . insulin glargine  5 Units Subcutaneous Daily  . nitroGLYCERIN  1 inch Topical Q6H  . predniSONE  40 mg Oral Q breakfast  . sodium chloride flush  3 mL Intravenous Q12H  . sulfamethoxazole-trimethoprim  1 tablet Oral Q T,Th,Sat-1800   . sodium chloride    . sodium chloride     sodium chloride, sodium chloride, acetaminophen **OR** acetaminophen, albuterol, bisacodyl, lidocaine-prilocaine, nitroGLYCERIN, ondansetron **OR** ondansetron (ZOFRAN) IV, pentafluoroprop-tetrafluoroeth, sodium chloride flush, traMADol, traZODone  Iron/TIBC/Ferritin/ %Sat    Component Value Date/Time   IRON 114 03/22/2018 0750   TIBC 225 (L) 03/22/2018 0750   FERRITIN 958 (H) 03/22/2018 0750   IRONPCTSAT 51 (H) 03/22/2018 0750    Exam: Gen on nasal O2, holding abdomen, in pain No jvd or bruits Chest clear bilat , rales resolved RRR no MRG, distant hs Abd soft ntnd no mass or ascites +bs Ext trace/ 1+ pretib edema Neuro is alert, Ox 3 , nf    Home meds:  - cytoxan 50 mg qd/ prednisone 40 mg qd  - pepcid 20 qd/ lantus 5u hs/ mvi/ bactrim DS 1 tiw/ prn ultram/ thiamine   Dialysis: TTS   4h   67.5kg  2/2.25 bath   Hep 2000  - mircera 75 ug q 2wks last 7/19  - calcitriol 0.25 ug   Impression: 1  Dyspnea/ resp distress - hx BOOP dx'd in June admission, f/b pulm on  steroids.  Admitted yest in resp distress, on bipap, possible pulm edema. Had dialysis last night, after 2 hours into HD developed SVT at 220 requiring RR team and adenosine x 2, converted to NSR 80's. Today is stable on nasal O2.  Only 1.7 L off w HD yest.  Not sure we can get much more fluid off of her, will get CT chest to f/u her primary lung process.  May need pulm input.  Will d/w primary MD.  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 HD dependent, on cytoxan 50 qd and prednisone for this.  If not showing signs of renal recovery soon (1st HD was 02/19/18), cytoxan will be stopped.   4  BP - no BP meds at home 5  Hx cocaine/ etoh abuse - pt no longer abusing these 6  Hep C 7  Thrombocytopenia , chronic  Plan - as above. HD tomorrow off sched then again Sat to get back on schedule.     Kelly Splinter MD [employer] pager [phone]   04/03/2018, 10:42 AM   Recent Labs  Lab 04/02/18  1445 04/02/18 2133 04/03/18 0236  NA 143 141 141  K 4.4 3.2* 4.0  CL 105 101 101  CO2 21* 22 24  GLUCOSE 315* 208* 201*  BUN 68* 29* 41*  CREATININE 7.37* 3.88* 5.11*  CALCIUM 8.0* 8.2* 7.8*  PHOS  --  4.3  --   ALBUMIN 3.1*  --   --    Recent Labs  Lab 04/02/18 1445  AST 25  ALT 28  ALKPHOS 69  BILITOT 1.0  PROT 5.6*   Recent Labs  Lab 03/27/18 1107  04/02/18 2133 04/03/18 0236  WBC 8.1   < > 10.1 9.0  NEUTROABS 7.3*  --   --   --   HGB 8.0*   < > 9.0* 7.8*  HCT 25.3*   < > 28.9* 25.0*  MCV 83   < > 84.8 85.3  PLT 126*   < > 122* 102*   < > = values in this interval not displayed.

## 2018-04-03 NOTE — Progress Notes (Signed)
HD tx ended 1 hr 51 min early d/t pt having a cardiac event during tx. Shortly after 2100 VS check, when pt was fine, the telemetry monitor started alarming, I looked up to find pt appearing to have what looked like a seizure, then she started kicking off all covers saying how hot she was, pt HR during this was > 200, highest I saw was 227, RR called, stopped tx, rinsed back and gave extra fluid in case it was a low bp issue, stopped and called a code. RR nurse and code team arrived, pt never lost consciousness and this was not a code but code team assisted. After being assessed by team, Adenosine 59m was pushed @ 2112 and @ 2113 pt had converted from SVT to SR in the 80's, pt stated she felt better. NP from ED also came to the bedside to assess pt, it was determined pt could still be transferred to the bed she was assigned to as she was already assigned to a step down bed. Dr. GMoshe Ciprowas called and made aware of situation, no new orders received. Goal not met, blood rinsed back, VSS w/ increased BP at the time pt was transferred off unit, report called to [place] RTherapist, sports

## 2018-04-04 DIAGNOSIS — R748 Abnormal levels of other serum enzymes: Secondary | ICD-10-CM

## 2018-04-04 LAB — RENAL FUNCTION PANEL
ANION GAP: 14 (ref 5–15)
Albumin: 2.7 g/dL — ABNORMAL LOW (ref 3.5–5.0)
BUN: 68 mg/dL — ABNORMAL HIGH (ref 8–23)
CHLORIDE: 100 mmol/L (ref 98–111)
CO2: 25 mmol/L (ref 22–32)
Calcium: 7.9 mg/dL — ABNORMAL LOW (ref 8.9–10.3)
Creatinine, Ser: 6.57 mg/dL — ABNORMAL HIGH (ref 0.44–1.00)
GFR calc non Af Amer: 6 mL/min — ABNORMAL LOW (ref >60)
GFR, EST AFRICAN AMERICAN: 7 mL/min — AB (ref >60)
GLUCOSE: 218 mg/dL — AB (ref 70–99)
Phosphorus: 5.7 mg/dL — ABNORMAL HIGH (ref 2.5–4.6)
Potassium: 4.1 mmol/L (ref 3.5–5.1)
Sodium: 139 mmol/L (ref 135–145)

## 2018-04-04 LAB — CBC
HCT: 24.6 % — ABNORMAL LOW (ref 36.0–46.0)
Hemoglobin: 7.5 g/dL — ABNORMAL LOW (ref 12.0–15.0)
MCH: 26.8 pg (ref 26.0–34.0)
MCHC: 30.5 g/dL (ref 30.0–36.0)
MCV: 87.9 fL (ref 78.0–100.0)
PLATELETS: 92 10*3/uL — AB (ref 150–400)
RBC: 2.8 MIL/uL — AB (ref 3.87–5.11)
RDW: 19.9 % — ABNORMAL HIGH (ref 11.5–15.5)
WBC: 7.8 10*3/uL (ref 4.0–10.5)

## 2018-04-04 LAB — GLUCOSE, CAPILLARY: Glucose-Capillary: 103 mg/dL — ABNORMAL HIGH (ref 70–99)

## 2018-04-04 LAB — VITAMIN B12: Vitamin B-12: 354 pg/mL (ref 180–914)

## 2018-04-04 LAB — PROCALCITONIN: Procalcitonin: 0.28 ng/mL

## 2018-04-04 MED ORDER — AMOXICILLIN-POT CLAVULANATE 500-125 MG PO TABS: 1.0000 | ORAL_TABLET | Freq: Two times a day (BID) | ORAL | 0 refills | Status: DC

## 2018-04-04 MED ORDER — INSULIN ASPART 100 UNIT/ML ~~LOC~~ SOLN
2.0000 [IU] | Freq: Three times a day (TID) | SUBCUTANEOUS | Status: DC
  Administered 2018-04-04: 2 [IU] via SUBCUTANEOUS

## 2018-04-04 MED ORDER — AMOXICILLIN-POT CLAVULANATE 500-125 MG PO TABS
1.0000 | ORAL_TABLET | Freq: Every day | ORAL | Status: DC
  Administered 2018-04-04: 500 mg via ORAL
  Filled 2018-04-04: qty 1

## 2018-04-04 MED ORDER — AMOXICILLIN-POT CLAVULANATE 500-125 MG PO TABS: 1.0000 | ORAL_TABLET | Freq: Every day | ORAL | 0 refills | Status: DC

## 2018-04-04 MED ORDER — AMOXICILLIN-POT CLAVULANATE 500-125 MG PO TABS
1.0000 | ORAL_TABLET | Freq: Two times a day (BID) | ORAL | Status: DC
  Filled 2018-04-04: qty 1

## 2018-04-04 MED FILL — Medication: Qty: 1 | Status: AC

## 2018-04-04 NOTE — Progress Notes (Addendum)
[facility] Progress Note  Subjective: no c/o today, on dialysis  Vitals:   04/04/18 0900 04/04/18 0930 04/04/18 1000 04/04/18 1030  BP: 136/80 140/80 136/80 133/80  Pulse: 74 79 74 70  Resp: 20 20 16 20   Temp:      TempSrc:      SpO2:      Weight:      Height:        Inpatient medications: . albuterol  2.5 mg Nebulization BID  . amoxicillin-clavulanate  1 tablet Oral BID  . Chlorhexidine Gluconate Cloth  6 each Topical Q0600  . insulin aspart  0-5 Units Subcutaneous QHS  . insulin aspart  0-9 Units Subcutaneous TID WC  . insulin aspart  2 Units Subcutaneous TID WC  . insulin glargine  5 Units Subcutaneous Daily  . predniSONE  40 mg Oral Q breakfast  . sodium chloride flush  3 mL Intravenous Q12H  . sulfamethoxazole-trimethoprim  1 tablet Oral Q T,Th,Sat-1800   . sodium chloride    . sodium chloride     sodium chloride, sodium chloride, acetaminophen **OR** acetaminophen, albuterol, bisacodyl, lidocaine-prilocaine, nitroGLYCERIN, ondansetron **OR** ondansetron (ZOFRAN) IV, pentafluoroprop-tetrafluoroeth, sodium chloride flush, traMADol, traZODone  Iron/TIBC/Ferritin/ %Sat    Component Value Date/Time   IRON 114 03/22/2018 0750   TIBC 225 (L) 03/22/2018 0750   FERRITIN 958 (H) 03/22/2018 0750   IRONPCTSAT 51 (H) 03/22/2018 0750    Exam: Gen on nasal O2, holding abdomen, in pain No jvd or bruits Chest clear bilat , rales resolved RRR no MRG, distant hs Abd soft ntnd no mass or ascites +bs Ext trace/ 1+ pretib edema Neuro is alert, Ox 3 , nf    Home meds:  - cytoxan 50 mg qd/ prednisone 40 mg qd  - pepcid 20 qd/ lantus 5u hs/ mvi/ bactrim DS 1 tiw/ prn ultram/ thiamine   Dialysis: TTS   4h   67.5kg  2/2.25 bath   Hep 2000  - mircera 75 ug q 2wks last 7/19  - calcitriol 0.25 ug   Impression: 1  Dyspnea/ resp distress - better. Prob some volume component, on exam vol excess is resolved now. Will lower dry wt a little further today to  around 64kg.  CXR's not helpful for vol assessment given interstitial process.  CT yest showing improving IS process; pt seen by pulmonary, cont steroids for now. Question crack lung vs pneumonitis.  No further vol reduction planned after HD this am, ok for dc from renal standpoint.   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  Will dc Cytoxan at this time, no signs of renal recovery and pancytopenia is concerning.  No plan for further immunosuppression from renal standpoint. Pt is off schedule for HD , she should go to her usual outpt HD tomorrow on Saturday to get back on schedule. Have d/w primary MD.  4  Hx cocaine abuse - tested + for cocaine this admission again 5  Hep C 6  Thrombocytopenia , chronic  Plan - as above  Kelly Splinter MD [place] pager [phone]   04/04/2018, 11:26 AM   Recent Labs  Lab 04/02/18 1445 04/02/18 2133 04/03/18 0236 04/04/18 0819  NA 143 141 141 139  K 4.4 3.2* 4.0 4.1  CL 105 101 101 100  CO2 21* 22 24 25   GLUCOSE 315* 208* 201* 218*  BUN 68* 29*  41* 68*  CREATININE 7.37* 3.88* 5.11* 6.57*  CALCIUM 8.0* 8.2* 7.8* 7.9*  PHOS  --  4.3  --  5.7*  ALBUMIN 3.1*  --   --  2.7*   Recent Labs  Lab 04/02/18 1445  AST 25  ALT 28  ALKPHOS 69  BILITOT 1.0  PROT 5.6*   Recent Labs  Lab 04/03/18 0236 04/04/18 0819  WBC 9.0 7.8  HGB 7.8* 7.5*  HCT 25.0* 24.6*  MCV 85.3 87.9  PLT 102* 92*

## 2018-04-04 NOTE — Evaluation (Signed)
Physical Therapy Evaluation Patient Details Name: OLEAN SANGSTER MRN: [id number] DOB: 1955/12/26 Today's Date: 04/04/2018   History of Present Illness  Pt is a 62 y.o. female admitted 04/02/18 with SOB; found to have fluid overload in setting of missing HD treatment. PMH includes polysubstance abuse (cocaine, ETOH), ESRD (T/Th/Sa, noncompliant), BOOP. Of note, 2x recent admissions with similar symptoms.    Clinical Impression  Pt presents with an overall decrease in functional mobility secondary to above. PTA, pt recently d/c home; indep with mobility, but remains limited by decreased activity tolerance. Today, pt very frustrated by not receiving lunch yet, only agreeable to amb in room. Able to do so with supervision; indep for ADLs. SpO2 91% on 2L O2 [place]. Educ on energy conservation techniques, especially when ascending 3 flights of steps to apartment. Recommend continued HHPT services to maximize functional mobility and indep upon return home. If remains admitted, will follow acutely to address established goals.    Follow Up Recommendations Home health PT;Supervision - Intermittent    Equipment Recommendations  None recommended by PT    Recommendations for Other Services       Precautions / Restrictions Precautions Precautions: Fall Restrictions Weight Bearing Restrictions: No      Mobility  Bed Mobility Overal bed mobility: Independent                Transfers Overall transfer level: Needs assistance Equipment used: None Transfers: Sit to/from Stand Sit to Stand: Supervision            Ambulation/Gait Ambulation/Gait assistance: Supervision   Assistive device: None   Gait velocity: Decreased   General Gait Details: Pt agreeable to ambulation in room, continues to decline DME for added stability (apparently was willing to trial rollator last visit). No overt instability, supervision for balance  Stairs         General stair comments: Pt declined stair  training this session. Reports she is able to sit on steps to take break during ascent to 3rd floor apartment; educ on further energy conservation techniques  Wheelchair Mobility    Modified Rankin (Stroke Patients Only)       Balance Overall balance assessment: Needs assistance Sitting-balance support: No upper extremity supported;Feet supported Sitting balance-Leahy Scale: Good Sitting balance - Comments: Indep with seated pericare   Standing balance support: No upper extremity supported;During functional activity Standing balance-Leahy Scale: Fair                               Pertinent Vitals/Pain Pain Assessment: No/denies pain    Home Living Family/patient expects to be discharged to:: Private residence Living Arrangements: Non-relatives/Friends Available Help at Discharge: Family;Available PRN/intermittently Type of Home: Apartment Home Access: Stairs to enter   Entrance Stairs-Number of Steps: 3 flights (3rd floor apt, no elevator) Home Layout: One level Home Equipment: None      Prior Function Level of Independence: Independent         Comments: Reports no driving or working, son does grocery shopping     Hand Dominance   Dominant Hand: Right    Extremity/Trunk Assessment   Upper Extremity Assessment Upper Extremity Assessment: Overall WFL for tasks assessed    Lower Extremity Assessment Lower Extremity Assessment: Generalized weakness    Cervical / Trunk Assessment Cervical / Trunk Assessment: Normal  Communication   Communication: No difficulties  Cognition Arousal/Alertness: Awake/alert Behavior During Therapy: WFL for tasks assessed/performed Overall Cognitive Status: Within Functional  Limits for tasks assessed                                        General Comments General comments (skin integrity, edema, etc.): SpO2 down to 86% on RA, returning to 92% on 2L O2 [place]    Exercises     Assessment/Plan     PT Assessment Patient needs continued PT services  PT Problem List Cardiopulmonary status limiting activity;Decreased strength;Decreased activity tolerance;Decreased balance;Decreased mobility;Decreased knowledge of use of DME       PT Treatment Interventions DME instruction;Gait training;Stair training;Functional mobility training;Therapeutic activities;Therapeutic exercise;Balance training;Neuromuscular re-education;Patient/family education    PT Goals (Current goals can be found in the Care Plan section)  Acute Rehab PT Goals Patient Stated Goal: "Get my lunch tray" PT Goal Formulation: With patient Time For Goal Achievement: 04/18/18 Potential to Achieve Goals: Good    Frequency Min 3X/week   Barriers to discharge        Co-evaluation               AM-PAC PT "6 Clicks" Daily Activity  Outcome Measure Difficulty turning over in bed (including adjusting bedclothes, sheets and blankets)?: None Difficulty moving from lying on back to sitting on the side of the bed? : None Difficulty sitting down on and standing up from a chair with arms (e.g., wheelchair, bedside commode, etc,.)?: None Help needed moving to and from a bed to chair (including a wheelchair)?: None Help needed walking in hospital room?: A Little Help needed climbing 3-5 steps with a railing? : A Little 6 Click Score: 22    End of Session Equipment Utilized During Treatment: Gait belt;Oxygen Activity Tolerance: Patient tolerated treatment well Patient left: in bed;with call bell/phone within reach Nurse Communication: Mobility status PT Visit Diagnosis: Unsteadiness on feet (R26.81);Difficulty in walking, not elsewhere classified (R26.2)    Time: 1829-9371 PT Time Calculation (min) (ACUTE ONLY): 9 min   Charges:   PT Evaluation $PT Eval Moderate Complexity: 1 Mod         Mabeline Caras, PT, DPT Acute Rehab Services  Pager: [place] 04/04/2018, 1:45 PM

## 2018-04-04 NOTE — Progress Notes (Signed)
Pt is alert and oriented x4.  Denies any pain, SOB, N,V.  Put on telemetry box.  Pt went to hemodialysis via bed.

## 2018-04-04 NOTE — Progress Notes (Signed)
Inpatient Diabetes Program Recommendations  AACE/ADA: New Consensus Statement on Inpatient Glycemic Control (2015)  Target Ranges:  Prepandial:   less than 140 mg/dL      Peak postprandial:   less than 180 mg/dL (1-2 hours)      Critically ill patients:  140 - 180 mg/dL   Results for Rhonda Lynch, Rhonda Lynch (MRN [id number]) as of 04/04/2018 08:52  Ref. Range 04/03/2018 07:47 04/03/2018 11:50 04/03/2018 16:47 04/03/2018 21:14  Glucose-Capillary Latest Ref Range: 70 - 99 mg/dL 189 (H) 212 (H) 226 (H) 259 (H)   Review of Glycemic Control  Diabetes history: DM 2 Outpatient Diabetes medications: Lantus 5 units Current orders for Inpatient glycemic control: Lantus 5 units, Novolog 0-20 units tid, Novolog 0-5 units qhs  Inpatient Diabetes Program Recommendations:    Patient on PO prednisone 40 mg Daily. Glucose increasing after meals in the 200 range. Please consider low dose Novolog meal coverage of 2 units tid if patient consumes at least 50% of meals.  Thanks,  Tama Headings RN, MSN, BC-ADM, [facility] Inpatient Diabetes Coordinator Team Pager [phone] (8a-5p)

## 2018-04-04 NOTE — Progress Notes (Signed)
Pt seen by MD, orders written for d/c.  Went over discharge instructions with pt and answered all questions.  Removed IV's x2, and telemetry.  Escorted for discharge via wheelchair with all belongings.  Will follow up outpatient with MD.

## 2018-04-06 NOTE — Discharge Summary (Signed)
Physician Discharge Summary  Rhonda Lynch OHY:[id number] DOB: 11/15/1955 DOA: 04/02/2018  PCP: Patient, No Pcp Per  Admit date: 04/02/2018 Discharge date: 04/04/2018  Admitted From: Home.  Disposition:  Home.   Recommendations for Outpatient Follow-up:  1. Follow up with PCP in 1-2 weeks 2. Please obtain BMP/CBC in one week Please follow up with nephrology as recommended.    Discharge Condition:stable.  CODE STATUS: full code.  Diet recommendation: Heart Healthy   Brief/Interim Summary: 62 y.o F, polysubstance abuser of cocaine and ETOH, ESRD on T/Th/Sa dialysis but is noncompliant to dialysis schedule, BOOP, h/o pneumonia, p/w SOB, and is now having acute hypoxia respiratory failure due to pulmonary edema due to missing dialysis. She was started on BIPAP and later on transitioned to [place] oxygen.   Discharge Diagnoses:  Principal Problem:   Acute respiratory failure ([place]) Active Problems:   ESRD (end stage renal disease) ([place])   Cocaine abuse ([place])   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[place])   Elevated troponin   Acute pulmonary edema ([facility])   Diabetes (H[facility])   SVT (supraventricular tachycardia) (H[facility])   Chronic renal failure  1. Acute respiratory failure. Likely related to pulmonary edema secondary to missing dialysis in the setting of known Boop on prednisone and followed by pulmonology. Chest x-ray as noted above.patient on BiPAP on admission. Resting comfortably evaluated by nephrology who recommends acute dialysis Resume nebulizer and [place] oxygen as needed.    #2. End-stage renal disease. Patient missed dialysis yesterday. She is a Tuesday Thursday Saturday patient. Evaluated by nephrology who recommended dialysis. SHE underwent HD and her resp status improved.   #3. Elevated troponin. Secondary to fluid overload and ESRD.  EKG without acute changes.  #4.Boop. Evaluated by pulmonology last admission. Prednisone was started. At discharge chest x-ray  showed significant improvement. Chart review indicates six-month taper of prednisone planned as well as outpatient follow-up with pulmonary.  #5. Diabetes. Recent hemoglobin A1c 6.8%. Home medications include Lantus. -Continue home Lantus on discharge.   6.history of cocaine use. Urine drug screen most recent admission negative.       Discharge Instructions  Discharge Instructions    Diet - low sodium heart healthy   Complete by:  As directed    Discharge instructions   Complete by:  As directed    Please follow up with PCP in one week.  Please follow up with pulmonology in 2 weeks.     Allergies as of 04/04/2018   No Known Allergies     Medication List    STOP taking these medications   cyclophosphamide 50 MG capsule Commonly known as:  CYTOXAN     TAKE these medications   amoxicillin-clavulanate 500-125 MG tablet Commonly known as:  AUGMENTIN Take 1 tablet (500 mg total) by mouth daily at 6 PM.   famotidine 20 MG tablet Commonly known as:  PEPCID Take 1 tablet (20 mg total) by mouth daily.   Insulin Glargine 100 UNIT/ML Solostar Pen Commonly known as:  LANTUS Inject 5 Units into the skin at bedtime. What changed:  when to take this   Insulin Pen Needle 31G X 5 MM Misc Use for once daily injections   multivitamin with minerals Tabs tablet Take 1 tablet by mouth daily.   predniSONE 20 MG tablet Commonly known as:  DELTASONE Take 2 tablets (40 mg total) by mouth daily with breakfast.   sulfamethoxazole-trimethoprim 800-160 MG tablet Commonly known as:  BACTRIM DS,SEPTRA DS Take 1 tablet by mouth 3 (three)  times a week. What changed:  when to take this   thiamine 100 MG tablet Take 1 tablet (100 mg total) by mouth daily.   traMADol 50 MG tablet Commonly known as:  ULTRAM Take 1 tablet (50 mg total) by mouth every 12 (twelve) hours as needed (refractory pain).      Follow-up [facility] Follow up on  04/22/2018.   Why:  11:10 for hospital follow apt Contact information: [place] [id number] [phone]       [facility].   Specialty:  Home Health Services Why:  HHPT Contact information: [address] [place] [address] [phone]          No Known Allergies  Consultations:  Nephrology.    Procedures/Studies: Ct Abdomen Pelvis Wo Contrast  Result Date: 03/19/2018 CLINICAL DATA:  62 year old female with reported kidney failure, recently discharged for pneumonia presents with abdominal pain. EXAM: CT ABDOMEN AND PELVIS WITHOUT CONTRAST TECHNIQUE: Multidetector CT imaging of the abdomen and pelvis was performed following the standard protocol without IV contrast. COMPARISON:  02/12/2018 FINDINGS: Lower chest: Cardiomegaly with small bilateral right greater than left pleural effusions. Redemonstration of bilateral bibasilar ground-glass opacities with inter and intra lobular septal thickening in a crazy paving pattern that can be seen with cryptogenic organizing pneumonia. Superimposed stigmata of CHF given presence of bilateral pleural effusions is not excluded. Hepatobiliary: The unenhanced liver is normal given limitations of a noncontrast study. The patient is status post cholecystectomy. Pancreas: Normal Spleen: Normal Adrenals/Urinary Tract: Mild bilateral perinephric fat stranding without obstructive uropathy. The urinary bladder is unremarkable for degree of distention. Stomach/Bowel: The stomach is decompressed. Normal small bowel rotation without obstruction or inflammation. The distal and terminal ileum and appendix are normal. Unremarkable appearance of the colon with average amount of fecal retention noted. Vascular/Lymphatic: Moderate aorto iliac atherosclerosis without aneurysm. No adenopathy. Reproductive: Hysterectomy. Small fat containing dermoid in the left ovary measuring 15 mm, stable in  appearance. Other: No free air nor free fluid. Small fat containing umbilical hernia. Musculoskeletal: No acute or significant osseous findings. Minimal anterolisthesis grade 1 of L4 on L5 due to facet arthropathy. IMPRESSION: 1. Bibasilar ground-glass opacities with interlobular and intralobular septal thickening sparing the subpleural lung. Findings can be seen in cryptogenic organizing pneumonia. Superimposed pulmonary edema would be difficult to entirely exclude given presence of new bilateral small pleural effusions, right greater than left. 2. 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 3. Minimal anterolisthesis grade 1 of L4 on L5 secondary to degenerative facet arthropathy. Electronically Signed   By: Ashley Royalty M.D.   On: 03/19/2018 20:29   Dg Chest 2 View  Result Date: 03/23/2018 CLINICAL DATA:  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[place]) J84.89 (ICD-10-CM) Acute respiratory failure with hypoxia ([place]) J96.01 (ICD-10-CM). EXAM: CHEST - 2 VIEW COMPARISON:  03/19/2018 FINDINGS: Significant improvement in diffuse bilateral airspace disease. Mild residual bilateral airspace disease. Small left effusion. Cardiac enlargement. Right jugular central venous catheter tip in the lower SVC unchanged. IMPRESSION: Significant improvement in diffuse bilateral airspace disease which may be due to clearing pneumonia or edema. Electronically Signed   By: Franchot Gallo M.D.   On: 03/23/2018 08:40   Dg Chest 2 View  Result Date: 03/19/2018 CLINICAL DATA:  Abdominal pain.  Recent dialysis shunt placement. EXAM: CHEST - 2 VIEW COMPARISON:  03/14/2018 and 03/06/2018 FINDINGS: Cardiomegaly with mild aortic atherosclerosis. Diffuse interstitial edema  persists with small bilateral pleural effusions. Right IJ dialysis catheter terminates at the cavoatrial junction and is unchanged in appearance. There is pneumothorax. No acute osseous abnormality. IMPRESSION: Stable cardiomegaly with mild aortic  atherosclerosis. Diffuse interstitial edema is again noted without significant change. Small bilateral pleural effusions. Electronically Signed   By: Ashley Royalty M.D.   On: 03/19/2018 17:03   Ct Chest Wo Contrast  Result Date: 04/03/2018 CLINICAL DATA:  Followup for presumed cryptogenic organizing pneumonia. EXAM: CT CHEST WITHOUT CONTRAST TECHNIQUE: Multidetector CT imaging of the chest was performed following the standard protocol without IV contrast. COMPARISON:  Current chest radiograph. Multiple prior chest radiographs. Chest CT 02/17/2018. FINDINGS: Cardiovascular: Heart is mildly enlarged. No pericardial effusion. No coronary artery calcifications. Great vessels normal in caliber. Mild aortic atherosclerosis. Mediastinum/Nodes: No neck base or axillary masses or enlarged lymph nodes. Normal thyroid. There are prominent mediastinal nodes, largest an AP window node measuring 13 mm in short axis, without significant change from the prior CT. Trachea is widely patent. Esophagus is unremarkable. Lungs/Pleura: Small, right greater than left, pleural effusions. Bilateral peribronchovascular ground-glass opacities are noted in all lobes. Mild bronchiectasis and centrilobular emphysema is noted in both lobes. The more extensive airspace opacities noted on the prior study have improved. No new lung abnormalities. No pneumothorax. Upper Abdomen: No acute abnormality. Musculoskeletal: No fracture or acute finding. No osteoblastic or osteolytic lesions. IMPRESSION: 1. There has been significant improvement since the prior chest CT. The bilateral areas of ground-glass lung opacification have improved. Underlying mild bronchiectasis is stable. The findings have upper lung predominance. No new lung abnormalities. 2. Small, right greater than left, pleural effusions have increased in size from prior CT. 3. Findings described may reflect improved pulmonary edema or could be due to improved cryptogenic organizing pneumonia  or other interstitial lung disease. Aortic Atherosclerosis (ICD10-I70.0). Electronically Signed   By: Lajean Manes M.D.   On: 04/03/2018 12:40   Nm Pulmonary Vent And Perf (v/q Scan)  Result Date: 03/20/2018 CLINICAL DATA:  Shortness of Breath EXAM: NUCLEAR MEDICINE VENTILATION - PERFUSION LUNG SCAN VIEWS: Anterior, posterior, left lateral, right lateral, RPO, LPO, RAO, LAO-ventilation and perfusion RADIOPHARMACEUTICALS:  31.6 mCi of Tc-42m DTPA aerosol inhalation and 4.2 mCi Tc70m-MAA IV COMPARISON:  Chest radiograph March 19, 2018 FINDINGS: Ventilation: Radiotracer uptake is homogeneous and symmetric bilaterally. No ventilation defects are evident. Perfusion: Radiotracer uptake is homogeneous and symmetric bilaterally. No appreciable perfusion defects. IMPRESSION: No appreciable ventilation or perfusion defects. This study constitutes a very low probability of pulmonary embolus. Electronically Signed   By: Lowella Grip III M.D.   On: 03/20/2018 10:20   Dg Chest Port 1 View  Result Date: 04/03/2018 CLINICAL DATA:  Hypoxemia.  History of end-stage renal disease. EXAM: PORTABLE CHEST 1 VIEW COMPARISON:  04/02/2018 and older exams. FINDINGS: Since the previous day's study, interstitial opacities are similar. Hazy intervening airspace opacities have improved. Hemidiaphragms are better defined. Findings are consistent with improved pulmonary edema reduced small pleural effusions. No new lung abnormalities. No pneumothorax. Cardiac silhouette borderline enlarged. Right internal jugular dual-lumen central venous catheter is stable. IMPRESSION: 1. Improved pulmonary edema when compared to the previous day's study. No new abnormalities. Electronically Signed   By: Lajean Manes M.D.   On: 04/03/2018 09:12   Dg Chest Port 1 View  Result Date: 04/02/2018 CLINICAL DATA:  Shortness of breath. EXAM: PORTABLE CHEST 1 VIEW COMPARISON:  Radiographs of March 23, 2018. FINDINGS: Stable cardiomegaly. Atherosclerosis of  thoracic aorta is noted. Stable  diffuse reticular densities are noted throughout both lungs which may represent chronic interstitial lung disease or scarring, but acute superimposed edema or inflammation cannot be excluded. No pneumothorax or significant pleural effusion is noted. Right internal jugular dialysis catheter is unchanged in position. Bony thorax is unremarkable. IMPRESSION: Stable bilateral diffuse reticular lung opacities are noted which may represent chronic interstitial lung disease or scarring, but acute superimposed edema or inflammation cannot be excluded. Electronically Signed   By: Marijo Conception, M.D.   On: 04/02/2018 14:56   Dg Chest Port 1 View  Result Date: 03/14/2018 CLINICAL DATA:  Fever EXAM: PORTABLE CHEST 1 VIEW COMPARISON:  March 06, 2018 FINDINGS: Central catheter tip is in the superior vena cava near the cavoatrial junction. No pneumothorax. There is mild interstitial edema diffusely with minimal pleural effusions bilaterally. No consolidation. Heart is mildly enlarged with pulmonary venous hypertension. No adenopathy. There is aortic atherosclerosis. No bone lesions. IMPRESSION: Central catheter as described without pneumothorax. Cardiomegaly with mild pulmonary venous hypertension consistent with a degree of pulmonary vascular congestion. Mild interstitial edema with minimal pleural effusions. No consolidation. There is aortic atherosclerosis. Aortic Atherosclerosis (ICD10-I70.0). Electronically Signed   By: Lowella Grip III M.D.   On: 03/14/2018 07:30       Subjective: NO NEW COMPLAINTS no chest pain or sob, no headache or dizziness.   Discharge Exam: Vitals:   04/04/18 1138 04/04/18 1201  BP: (!) 150/89 (!) 151/78  Pulse: 70 72  Resp: 16 17  Temp: 98 F (36.7 C) 98.1 F (36.7 C)  SpO2: 99% 99%   Vitals:   04/04/18 1030 04/04/18 1100 04/04/18 1138 04/04/18 1201  BP: 133/80 130/80 (!) 150/89 (!) 151/78  Pulse: 70 74 70 72  Resp: 20 16 16 17   Temp:    98 F (36.7 C) 98.1 F (36.7 C)  TempSrc:   Oral Oral  SpO2: 99% 99% 99% 99%  Weight:   64.2 kg (141 lb 8.6 oz)   Height:        General: Pt is alert, awake, not in acute distress Cardiovascular: RRR, S1/S2 +, no rubs, no gallops Respiratory: CTA bilaterally, no wheezing, no rhonchi Abdominal: Soft, NT, ND, bowel sounds + Extremities: no edema, no cyanosis    The results of significant diagnostics from this hospitalization (including imaging, microbiology, ancillary and laboratory) are listed below for reference.     Microbiology: Recent Results (from the past 240 hour(s))  MRSA PCR Screening     Status: None   Collection Time: 04/02/18 10:15 PM  Result Value Ref Range Status   MRSA by PCR NEGATIVE NEGATIVE Final    Comment:        The GeneXpert MRSA Assay (FDA approved for NASAL specimens only), is one component of a comprehensive MRSA colonization surveillance program. It is not intended to diagnose MRSA infection nor to guide or monitor treatment for MRSA infections. Performed at [facility], [place] [address] [place], [place] [address]      Labs: BNP (last 3 results) Recent Labs    02/12/18 1534  BNP 517.0*   Basic Metabolic Panel: Recent Labs  Lab 04/02/18 1445 04/02/18 2133 04/03/18 0236 04/04/18 0819  NA 143 141 141 139  K 4.4 3.2* 4.0 4.1  CL 105 101 101 100  CO2 21* 22 24 25   GLUCOSE 315* 208* 201* 218*  BUN 68* 29* 41* 68*  CREATININE 7.37* 3.88* 5.11* 6.57*  CALCIUM 8.0* 8.2* 7.8* 7.9*  PHOS  --  4.3  --  5.7*   Liver Function Tests: Recent Labs  Lab 04/02/18 1445 04/04/18 0819  AST 25  --   ALT 28  --   ALKPHOS 69  --   BILITOT 1.0  --   PROT 5.6*  --   ALBUMIN 3.1* 2.7*   No results for input(s): LIPASE, AMYLASE in the last 168 hours. No results for input(s): AMMONIA in the last 168 hours. CBC: Recent Labs  Lab 04/02/18 1445 04/02/18 2133 04/03/18 0236 04/04/18 0819  WBC 11.0* 10.1 9.0 7.8  HGB 8.8* 9.0* 7.8* 7.5*   HCT 28.3* 28.9* 25.0* 24.6*  MCV 86.5 84.8 85.3 87.9  PLT 132* 122* 102* 92*   Cardiac Enzymes: Recent Labs  Lab 04/02/18 1445 04/02/18 1818 04/02/18 2134 04/03/18 0236  TROPONINI 0.20* 0.24* 0.19* 0.15*   BNP: Invalid input(s): POCBNP CBG: Recent Labs  Lab 04/03/18 0747 04/03/18 1150 04/03/18 1647 04/03/18 2114 04/04/18 1159  GLUCAP 189* 212* 226* 259* 103*   D-Dimer No results for input(s): DDIMER in the last 72 hours. Hgb A1c No results for input(s): HGBA1C in the last 72 hours. Lipid Profile No results for input(s): CHOL, HDL, LDLCALC, TRIG, CHOLHDL, LDLDIRECT in the last 72 hours. Thyroid function studies No results for input(s): TSH, T4TOTAL, T3FREE, THYROIDAB in the last 72 hours.  Invalid input(s): FREET3 Anemia work up Recent Labs    04/04/18 0321  VITAMINB12 354   Urinalysis    Component Value Date/Time   COLORURINE YELLOW 04/02/2018 1720   APPEARANCEUR HAZY (A) 04/02/2018 1720   LABSPEC 1.015 04/02/2018 1720   PHURINE 6.0 04/02/2018 1720   GLUCOSEU 150 (A) 04/02/2018 1720   HGBUR LARGE (A) 04/02/2018 1720   HGBUR trace-intact 03/08/2010 1452   BILIRUBINUR NEGATIVE 04/02/2018 1720   KETONESUR NEGATIVE 04/02/2018 1720   PROTEINUR >=300 (A) 04/02/2018 1720   UROBILINOGEN 0.2 03/08/2010 1452   NITRITE NEGATIVE 04/02/2018 1720   LEUKOCYTESUR TRACE (A) 04/02/2018 1720   Sepsis Labs Invalid input(s): PROCALCITONIN,  WBC,  LACTICIDVEN Microbiology Recent Results (from the past 240 hour(s))  MRSA PCR Screening     Status: None   Collection Time: 04/02/18 10:15 PM  Result Value Ref Range Status   MRSA by PCR NEGATIVE NEGATIVE Final    Comment:        The GeneXpert MRSA Assay (FDA approved for NASAL specimens only), is one component of a comprehensive MRSA colonization surveillance program. It is not intended to diagnose MRSA infection nor to guide or monitor treatment for MRSA infections. Performed at [facility], [place] [address], [place], [place] [address]      Time coordinating discharge: 35 minutes  SIGNED:   Hosie Poisson, MD  [facility] 04/06/2018, 10:38 PM Pager   If 7PM-7AM, please contact night-coverage [email or web address] Password [email or web address]

## 2018-04-09 ENCOUNTER — Telehealth (INDEPENDENT_AMBULATORY_CARE_PROVIDER_SITE_OTHER): Payer: Self-pay | Admitting: General Practice

## 2018-04-09 ENCOUNTER — Other Ambulatory Visit (INDEPENDENT_AMBULATORY_CARE_PROVIDER_SITE_OTHER): Payer: Self-pay | Admitting: Physician Assistant

## 2018-04-09 DIAGNOSIS — Z8719 Personal history of other diseases of the digestive system: Secondary | ICD-10-CM

## 2018-04-09 MED ORDER — DOCUSATE SODIUM 50 MG PO CAPS: 50.0000 mg | ORAL_CAPSULE | Freq: Every day | ORAL | 0 refills | Status: DC | PRN

## 2018-04-09 NOTE — Telephone Encounter (Signed)
[facility] called requesting verbal order for 1x a week for 4 weeks and also for Skill nursing for diabetic teaching. Front desk consulted with PCP about verbal orders and was able to authorized. Threasa Beards also wanted to inform the patient blood pressure was 160/94 and patient does not take any HTN medicine. Threasa Beards states that patient also stated that she is also constipated but is not constipated at the moment and wants to know if PCP can prescribe any stool softner or laxative.  Please Advice Threasa Beards [phone]  [place] [phone]  Thank you Emmit Pomfret

## 2018-04-09 NOTE — Telephone Encounter (Signed)
Docusate sent to [facility]. Please notify patient.

## 2018-04-09 NOTE — Telephone Encounter (Signed)
FWD to PCP. Rhonda Lynch, CMA  

## 2018-04-10 NOTE — Telephone Encounter (Signed)
Patients son is aware and will inform patient. Nat Christen, CMA

## 2018-04-11 ENCOUNTER — Telehealth (INDEPENDENT_AMBULATORY_CARE_PROVIDER_SITE_OTHER): Payer: Self-pay | Admitting: Physician Assistant

## 2018-04-11 NOTE — Telephone Encounter (Signed)
Patient called requesting a medication refill for   sulfamethoxazole-trimethoprim (BACTRIM DS,SEPTRA DS) 800-160 MG tablet   traMADol (ULTRAM) 50 MG tablet   Multiple Vitamin (MULTIVITAMIN WITH MINERALS) TABS tablet   Patient also states that she can not sleep at night an wanted to know if PCP can prescribe anything to help.   Patient uses   [facility], [address]  Please Advice [phone]  Thank You.

## 2018-04-11 NOTE — Telephone Encounter (Signed)
Refills denied. Can come to appointment or see Urgent care to discuss her insomnia and refills.

## 2018-04-11 NOTE — Telephone Encounter (Signed)
FWD to PCP. Rhonda Lynch, CMA  

## 2018-04-12 ENCOUNTER — Inpatient Hospital Stay (HOSPITAL_COMMUNITY)
Admission: EM | Admit: 2018-04-12 | Discharge: 2018-04-13 | DRG: 698 | Disposition: A | Discharge status: Home / Self Care | Payer: Medicaid Other | Attending: Family Medicine | Admitting: Family Medicine

## 2018-04-12 ENCOUNTER — Encounter (HOSPITAL_COMMUNITY): Payer: Self-pay | Admitting: Emergency Medicine

## 2018-04-12 ENCOUNTER — Emergency Department (HOSPITAL_COMMUNITY): Payer: Medicaid Other

## 2018-04-12 ENCOUNTER — Other Ambulatory Visit: Payer: Self-pay

## 2018-04-12 DIAGNOSIS — J810 Acute pulmonary edema: Secondary | ICD-10-CM | POA: Diagnosis not present

## 2018-04-12 DIAGNOSIS — J9621 Acute and chronic respiratory failure with hypoxia: Secondary | ICD-10-CM | POA: Diagnosis present

## 2018-04-12 DIAGNOSIS — E1165 Type 2 diabetes mellitus with hyperglycemia: Secondary | ICD-10-CM | POA: Diagnosis present

## 2018-04-12 DIAGNOSIS — I120 Hypertensive chronic kidney disease with stage 5 chronic kidney disease or end stage renal disease: Secondary | ICD-10-CM | POA: Diagnosis present

## 2018-04-12 DIAGNOSIS — Z992 Dependence on renal dialysis: Secondary | ICD-10-CM | POA: Diagnosis present

## 2018-04-12 DIAGNOSIS — D279 Benign neoplasm of unspecified ovary: Secondary | ICD-10-CM | POA: Diagnosis present

## 2018-04-12 DIAGNOSIS — N186 End stage renal disease: Secondary | ICD-10-CM

## 2018-04-12 DIAGNOSIS — Z79899 Other long term (current) drug therapy: Secondary | ICD-10-CM

## 2018-04-12 DIAGNOSIS — D696 Thrombocytopenia, unspecified: Secondary | ICD-10-CM | POA: Diagnosis present

## 2018-04-12 DIAGNOSIS — Z91199 Patient's noncompliance with other medical treatment and regimen due to unspecified reason: Secondary | ICD-10-CM

## 2018-04-12 DIAGNOSIS — F17210 Nicotine dependence, cigarettes, uncomplicated: Secondary | ICD-10-CM | POA: Diagnosis present

## 2018-04-12 DIAGNOSIS — D649 Anemia, unspecified: Secondary | ICD-10-CM | POA: Diagnosis present

## 2018-04-12 DIAGNOSIS — E1122 Type 2 diabetes mellitus with diabetic chronic kidney disease: Principal | ICD-10-CM | POA: Diagnosis present

## 2018-04-12 DIAGNOSIS — E8779 Other fluid overload: Secondary | ICD-10-CM | POA: Diagnosis present

## 2018-04-12 DIAGNOSIS — J8489 Other specified interstitial pulmonary diseases: Secondary | ICD-10-CM

## 2018-04-12 DIAGNOSIS — M7989 Other specified soft tissue disorders: Secondary | ICD-10-CM | POA: Diagnosis not present

## 2018-04-12 DIAGNOSIS — R0603 Acute respiratory distress: Secondary | ICD-10-CM | POA: Diagnosis present

## 2018-04-12 DIAGNOSIS — Z7952 Long term (current) use of systemic steroids: Secondary | ICD-10-CM | POA: Diagnosis not present

## 2018-04-12 DIAGNOSIS — Z794 Long term (current) use of insulin: Secondary | ICD-10-CM

## 2018-04-12 DIAGNOSIS — Z792 Long term (current) use of antibiotics: Secondary | ICD-10-CM

## 2018-04-12 DIAGNOSIS — R1013 Epigastric pain: Secondary | ICD-10-CM | POA: Diagnosis present

## 2018-04-12 DIAGNOSIS — L819 Disorder of pigmentation, unspecified: Secondary | ICD-10-CM | POA: Diagnosis present

## 2018-04-12 DIAGNOSIS — Z79891 Long term (current) use of opiate analgesic: Secondary | ICD-10-CM | POA: Diagnosis not present

## 2018-04-12 DIAGNOSIS — R7989 Other specified abnormal findings of blood chemistry: Secondary | ICD-10-CM | POA: Diagnosis present

## 2018-04-12 DIAGNOSIS — Z9981 Dependence on supplemental oxygen: Secondary | ICD-10-CM

## 2018-04-12 DIAGNOSIS — Z9119 Patient's noncompliance with other medical treatment and regimen: Secondary | ICD-10-CM

## 2018-04-12 DIAGNOSIS — I10 Essential (primary) hypertension: Secondary | ICD-10-CM | POA: Diagnosis present

## 2018-04-12 DIAGNOSIS — R778 Other specified abnormalities of plasma proteins: Secondary | ICD-10-CM | POA: Diagnosis present

## 2018-04-12 DIAGNOSIS — E119 Type 2 diabetes mellitus without complications: Secondary | ICD-10-CM

## 2018-04-12 DIAGNOSIS — J9601 Acute respiratory failure with hypoxia: Secondary | ICD-10-CM | POA: Diagnosis not present

## 2018-04-12 DIAGNOSIS — J84116 Cryptogenic organizing pneumonia: Secondary | ICD-10-CM | POA: Diagnosis present

## 2018-04-12 DIAGNOSIS — F17200 Nicotine dependence, unspecified, uncomplicated: Secondary | ICD-10-CM | POA: Diagnosis present

## 2018-04-12 LAB — I-STAT TROPONIN, ED: Troponin i, poc: 0.12 ng/mL (ref 0.00–0.08)

## 2018-04-12 LAB — CBC
HEMATOCRIT: 28.6 % — AB (ref 36.0–46.0)
Hemoglobin: 8.9 g/dL — ABNORMAL LOW (ref 12.0–15.0)
MCH: 26.6 pg (ref 26.0–34.0)
MCHC: 31.1 g/dL (ref 30.0–36.0)
MCV: 85.6 fL (ref 78.0–100.0)
Platelets: 107 10*3/uL — ABNORMAL LOW (ref 150–400)
RBC: 3.34 MIL/uL — ABNORMAL LOW (ref 3.87–5.11)
RDW: 17.5 % — ABNORMAL HIGH (ref 11.5–15.5)
WBC: 6.2 10*3/uL (ref 4.0–10.5)

## 2018-04-12 LAB — BASIC METABOLIC PANEL
ANION GAP: 13 (ref 5–15)
BUN: 52 mg/dL — ABNORMAL HIGH (ref 8–23)
CHLORIDE: 97 mmol/L — AB (ref 98–111)
CO2: 23 mmol/L (ref 22–32)
Calcium: 8.5 mg/dL — ABNORMAL LOW (ref 8.9–10.3)
Creatinine, Ser: 4.75 mg/dL — ABNORMAL HIGH (ref 0.44–1.00)
GFR calc non Af Amer: 9 mL/min — ABNORMAL LOW (ref >60)
GFR, EST AFRICAN AMERICAN: 10 mL/min — AB (ref >60)
GLUCOSE: 582 mg/dL — AB (ref 70–99)
Potassium: 3.5 mmol/L (ref 3.5–5.1)
Sodium: 133 mmol/L — ABNORMAL LOW (ref 135–145)

## 2018-04-12 LAB — GLUCOSE, CAPILLARY: GLUCOSE-CAPILLARY: 188 mg/dL — AB (ref 70–99)

## 2018-04-12 MED ORDER — CHLORHEXIDINE GLUCONATE CLOTH 2 % EX PADS: 6.0000 | MEDICATED_PAD | Freq: Every day | CUTANEOUS | Status: DC

## 2018-04-12 MED ORDER — NITROGLYCERIN IN D5W 200-5 MCG/ML-% IV SOLN
0.0000 ug/min | Freq: Once | INTRAVENOUS | Status: AC
  Administered 2018-04-12: 5 ug/min via INTRAVENOUS
  Filled 2018-04-12: qty 250

## 2018-04-12 MED ORDER — ACETAMINOPHEN 650 MG RE SUPP: 650.0000 mg | Freq: Four times a day (QID) | RECTAL | Status: DC | PRN

## 2018-04-12 MED ORDER — INSULIN ASPART 100 UNIT/ML ~~LOC~~ SOLN
20.0000 [IU] | Freq: Once | SUBCUTANEOUS | Status: AC
  Administered 2018-04-12: 20 [IU] via SUBCUTANEOUS

## 2018-04-12 MED ORDER — SENNOSIDES-DOCUSATE SODIUM 8.6-50 MG PO TABS: 1.0000 | ORAL_TABLET | Freq: Every evening | ORAL | Status: DC | PRN

## 2018-04-12 MED ORDER — DOCUSATE SODIUM 50 MG PO CAPS
50.0000 mg | ORAL_CAPSULE | Freq: Every day | ORAL | Status: DC | PRN
  Filled 2018-04-12: qty 1

## 2018-04-12 MED ORDER — TRAMADOL HCL 50 MG PO TABS
50.0000 mg | ORAL_TABLET | Freq: Two times a day (BID) | ORAL | Status: DC | PRN
  Administered 2018-04-12 – 2018-04-13 (×2): 50 mg via ORAL
  Filled 2018-04-12: qty 1

## 2018-04-12 MED ORDER — FAMOTIDINE 20 MG PO TABS
20.0000 mg | ORAL_TABLET | Freq: Every day | ORAL | Status: DC
  Administered 2018-04-13: 20 mg via ORAL
  Filled 2018-04-12: qty 1

## 2018-04-12 MED ORDER — HEPARIN SODIUM (PORCINE) 1000 UNIT/ML DIALYSIS
2000.0000 [IU] | INTRAMUSCULAR | Status: DC | PRN
  Administered 2018-04-12: 2000 [IU] via INTRAVENOUS_CENTRAL
  Filled 2018-04-12: qty 2

## 2018-04-12 MED ORDER — INSULIN ASPART 100 UNIT/ML ~~LOC~~ SOLN: 0.0000 [IU] | Freq: Every day | SUBCUTANEOUS | Status: DC

## 2018-04-12 MED ORDER — SODIUM CHLORIDE 0.9 % IV SOLN: 100.0000 mL | INTRAVENOUS | Status: DC | PRN

## 2018-04-12 MED ORDER — HEPARIN SODIUM (PORCINE) 1000 UNIT/ML DIALYSIS
1000.0000 [IU] | INTRAMUSCULAR | Status: DC | PRN
  Filled 2018-04-12: qty 1

## 2018-04-12 MED ORDER — INSULIN ASPART 100 UNIT/ML ~~LOC~~ SOLN
0.0000 [IU] | Freq: Three times a day (TID) | SUBCUTANEOUS | Status: DC
  Administered 2018-04-13 (×2): 3 [IU] via SUBCUTANEOUS

## 2018-04-12 MED ORDER — INSULIN ASPART 100 UNIT/ML ~~LOC~~ SOLN
SUBCUTANEOUS | Status: AC
  Filled 2018-04-12: qty 1

## 2018-04-12 MED ORDER — METHYLPREDNISOLONE SODIUM SUCC 125 MG IJ SOLR: 125.0000 mg | Freq: Once | INTRAMUSCULAR | Status: DC

## 2018-04-12 MED ORDER — PREDNISONE 20 MG PO TABS
40.0000 mg | ORAL_TABLET | Freq: Every day | ORAL | Status: DC
  Administered 2018-04-13: 40 mg via ORAL
  Filled 2018-04-12: qty 2

## 2018-04-12 MED ORDER — SULFAMETHOXAZOLE-TRIMETHOPRIM 800-160 MG PO TABS
1.0000 | ORAL_TABLET | ORAL | Status: DC
Start: 2018-04-12 — End: 2018-04-13
  Filled 2018-04-12: qty 1

## 2018-04-12 MED ORDER — ONDANSETRON HCL 4 MG PO TABS: 4.0000 mg | ORAL_TABLET | Freq: Four times a day (QID) | ORAL | Status: DC | PRN

## 2018-04-12 MED ORDER — INSULIN ASPART 100 UNIT/ML ~~LOC~~ SOLN: 12.0000 [IU] | Freq: Once | SUBCUTANEOUS | Status: DC

## 2018-04-12 MED ORDER — INSULIN REGULAR HUMAN 100 UNIT/ML IJ SOLN
INTRAMUSCULAR | Status: DC
  Filled 2018-04-12: qty 1

## 2018-04-12 MED ORDER — TRAMADOL HCL 50 MG PO TABS
ORAL_TABLET | ORAL | Status: AC
  Filled 2018-04-12: qty 1

## 2018-04-12 MED ORDER — INSULIN GLARGINE 100 UNIT/ML ~~LOC~~ SOLN
5.0000 [IU] | Freq: Every day | SUBCUTANEOUS | Status: DC
  Administered 2018-04-12: 5 [IU] via SUBCUTANEOUS
  Filled 2018-04-12: qty 0.05

## 2018-04-12 MED ORDER — HEPARIN SODIUM (PORCINE) 5000 UNIT/ML IJ SOLN
5000.0000 [IU] | Freq: Three times a day (TID) | INTRAMUSCULAR | Status: DC
  Administered 2018-04-12 – 2018-04-13 (×2): 5000 [IU] via SUBCUTANEOUS
  Filled 2018-04-12 (×2): qty 1

## 2018-04-12 MED ORDER — AMOXICILLIN-POT CLAVULANATE 500-125 MG PO TABS
1.0000 | ORAL_TABLET | Freq: Every day | ORAL | Status: DC
  Filled 2018-04-12 (×2): qty 1

## 2018-04-12 MED ORDER — VITAMIN B-1 100 MG PO TABS
100.0000 mg | ORAL_TABLET | Freq: Every day | ORAL | Status: DC
  Administered 2018-04-13: 100 mg via ORAL
  Filled 2018-04-12 (×4): qty 1

## 2018-04-12 MED ORDER — BENZONATATE 100 MG PO CAPS: 100.0000 mg | ORAL_CAPSULE | Freq: Three times a day (TID) | ORAL | 0 refills | Status: DC

## 2018-04-12 MED ORDER — ACETAMINOPHEN 325 MG PO TABS: 650.0000 mg | ORAL_TABLET | Freq: Four times a day (QID) | ORAL | Status: DC | PRN

## 2018-04-12 MED ORDER — ALTEPLASE 2 MG IJ SOLR: 2.0000 mg | Freq: Once | INTRAMUSCULAR | Status: DC | PRN

## 2018-04-12 MED ORDER — ONDANSETRON HCL 4 MG/2ML IJ SOLN: 4.0000 mg | Freq: Four times a day (QID) | INTRAMUSCULAR | Status: DC | PRN

## 2018-04-12 MED ORDER — CALCITRIOL 0.25 MCG PO CAPS
0.2500 ug | ORAL_CAPSULE | ORAL | Status: DC
  Administered 2018-04-13: 0.25 ug via ORAL
  Filled 2018-04-12: qty 1

## 2018-04-12 MED ORDER — ZOLPIDEM TARTRATE 5 MG PO TABS
5.0000 mg | ORAL_TABLET | Freq: Every evening | ORAL | Status: DC | PRN
  Administered 2018-04-12: 5 mg via ORAL
  Filled 2018-04-12: qty 1

## 2018-04-12 NOTE — H&P (View-Only) (Signed)
History and Physical    Rhonda Lynch DXI:[id number] DOB: 03-09-1956 DOA: 04/12/2018  PCP: Clent Demark, PA-C Patient coming from: home  Chief Complaint: sob  HPI: Rhonda Lynch is a 62 y.o. female with medical history significant for  Cocaine and EtOH abuse, end-stage renal disease Tuesday Thursday Saturday dialysis patient as well as plasmapheresis has been started on steroids and cyclophosphamide, Streptococcus pneumonia, bruit, diabetes, upper tension, presents to emergency Department chief complaint respiratory distress. Initial evaluation reveals acute on chronic respiratory failure with hypoxia due to pulmonary edema in setting of uncontrolled blood pressure and BOOP as well as hyperglycemia and elevated troponin. [facility] are asked to admit  Formation is obtained from the patient and the chart. She was discharged 8 days ago after a 2 day hospitalization for same.she reports she was in her usual state of health until this morning she suddenly became short of breath.he denies chest pain palpitations headache dizziness syncope or near-syncope. She denies cough fever chills nausea vomiting. She reports compliance with her Thursday dialysis that she completed all treatment for 4 hours. He called EMS who reports she was in respiratory distress which improved on C Pap. She is also provided with sublingual nitroglycerin.  ED Course: in the emergency department she switched to BiPAP provided a nitroglycerin drip. The time of admission oxygen saturation greater than 90% on BiPAP no respiratory distress.she is afebrile hemodynamically stable with a blood pressure that is elevated.  Review of Systems: As per HPI otherwise all other systems reviewed and are negative.   Ambulatory Status: ambulates independantly  Past Medical History:  Diagnosis Date  . Chronic kidney disease   . ESRD (end stage renal disease) ([place])   . Hypertension   . Oxygen deficiency    2 L at night  .  Substance abuse ([place])    has been clean for 4 months     Past Surgical History:  Procedure Laterality Date  . AV FISTULA PLACEMENT Left 03/05/2018   Procedure: ARTERIOVENOUS (AV) FISTULA CREATION  LEFT UPPER EXTREMITY;  Surgeon: [name], MD;  Location: [facility];  Service: Vascular;  Laterality: Left;  . HEMATOMA EVACUATION Left 03/06/2018   Procedure: EVACUATION HEMATOMA LEFT ARM;  Surgeon: Angelia Mould, MD;  Location: [facility];  Service: Vascular;  Laterality: Left;  . IR FLUORO GUIDE CV LINE RIGHT  02/18/2018  . IR FLUORO GUIDE CV LINE RIGHT  02/26/2018  . IR US GUIDE VASC ACCESS RIGHT  02/18/2018    Social History   Socioeconomic History  . Marital status: Married    Spouse name: Not on file  . Number of children: Not on file  . Years of education: Not on file  . Highest education level: Not on file  Occupational History  . Not on file  Social Needs  . Financial resource strain: Not on file  . Food insecurity:    Worry: Not on file    Inability: Not on file  . Transportation needs:    Medical: Not on file    Non-medical: Not on file  Tobacco Use  . Smoking status: Current Every Day Smoker    Packs/day: 0.50  . Smokeless tobacco: Never Used  . Tobacco comment: trying to quit 04/02/2018  Substance and Sexual Activity  . Alcohol use: Yes  . Drug use: Not Currently    Types: Cocaine  . Sexual activity: Not Currently    Birth control/protection: None  Lifestyle  . Physical activity:  Days per week: Not on file    Minutes per session: Not on file  . Stress: Not on file  Relationships  . Social connections:    Talks on phone: Not on file    Gets together: Not on file    Attends religious service: Not on file    Active member of club or organization: Not on file    Attends meetings of clubs or organizations: Not on file    Relationship status: Not on file  . Intimate partner violence:    Fear of current or ex partner: Not on file    Emotionally abused: Not  on file    Physically abused: Not on file    Forced sexual activity: Not on file  Other Topics Concern  . Not on file  Social History Narrative  . Not on file    No Known Allergies  Family History  Problem Relation Age of Onset  . Autoimmune disease Neg Hx     Prior to Admission medications   Medication Sig Start Date End Date Taking? Authorizing Provider  amoxicillin-clavulanate (AUGMENTIN) 500-125 MG tablet Take 1 tablet (500 mg total) by mouth daily at 6 PM. 04/05/18   Hosie Poisson, MD  benzonatate (TESSALON) 100 MG capsule Take 1 capsule (100 mg total) by mouth every 8 (eight) hours. 04/12/18   Pattricia Boss, MD  docusate sodium (COLACE) 50 MG capsule Take 1 capsule (50 mg total) by mouth daily as needed for mild constipation. 04/09/18   Clent Demark, PA-C  famotidine (PEPCID) 20 MG tablet Take 1 tablet (20 mg total) by mouth daily. 03/16/18   Elgergawy, Silver Huguenin, MD  Insulin Glargine (LANTUS) 100 UNIT/ML Solostar Pen Inject 5 Units into the skin at bedtime. Patient taking differently: Inject 5 Units into the skin daily.  03/25/18   Clent Demark, PA-C  Insulin Pen Needle 31G X 5 MM MISC Use for once daily injections 03/25/18   Clent Demark, PA-C  Multiple Vitamin (MULTIVITAMIN WITH MINERALS) TABS tablet Take 1 tablet by mouth daily. 03/16/18   Elgergawy, Silver Huguenin, MD  predniSONE (DELTASONE) 20 MG tablet Take 2 tablets (40 mg total) by mouth daily with breakfast. 03/25/18   Clent Demark, PA-C  sulfamethoxazole-trimethoprim (BACTRIM DS,SEPTRA DS) 800-160 MG tablet Take 1 tablet by mouth 3 (three) times a week. Patient taking differently: Take 1 tablet by mouth every Tuesday, Thursday, and Saturday at 6 PM.  03/17/18   Elgergawy, Silver Huguenin, MD  thiamine 100 MG tablet Take 1 tablet (100 mg total) by mouth daily. 03/25/18   Clent Demark, PA-C  traMADol (ULTRAM) 50 MG tablet Take 1 tablet (50 mg total) by mouth every 12 (twelve) hours as needed (refractory pain). 03/24/18    Patrecia Pour, MD    Physical Exam: Vitals:   04/12/18 1119 04/12/18 1145 04/12/18 1200 04/12/18 1215  BP:  (!) 157/104 (!) 165/110 (!) 181/116  Pulse:  90 95 88  Resp:  (!) 38 (!) 25 (!) 31  Temp:      TempSrc:      SpO2:  100% 100% 100%  Height: 5\' 1"  (1.549 m)        General:  Appears calm and comfortable while lights are out and patient is resting. Upon waking her she immediately begins to cry and screaming in pain.remains on BiPAP Eyes:  PERRL, EOMI, normal lids, iris ENT:  grossly normal hearing, lips & tongue, mmm Neck:  no LAD, masses or thyromegaly Cardiovascular:  RRR, no m/r/g. 1+ LE edema bilaterally Respiratory: mild to moderate increased work of breathing with conversation mostly related to patient's screening. Breath sounds with fair air movement quite coarse throughout. Some fine crackles in the bases. Abdomen:  soft, ntnd, positive bowel sounds throughout no guarding or rebounding Skin:  no rash or induration seen on limited exam Musculoskeletal:  grossly normal tone BUE/BLE, good ROM, no bony abnormality Psychiatric:  grossly normal mood and affect, speech fluent and appropriate, AOx3 Neurologic:  CN 2-12 grossly intact, moves all extremities in coordinated fashion, sensation intact  Labs on Admission: I have personally reviewed following labs and imaging studies  CBC: Recent Labs  Lab 04/12/18 1139  WBC 6.2  HGB 8.9*  HCT 28.6*  MCV 85.6  PLT 993*   Basic Metabolic Panel: Recent Labs  Lab 04/12/18 1139  NA 133*  K 3.5  CL 97*  CO2 23  GLUCOSE 582*  BUN 52*  CREATININE 4.75*  CALCIUM 8.5*   GFR: Estimated Creatinine Clearance: 10.3 mL/min (A) (by C-G formula based on SCr of 4.75 mg/dL (H)). Liver Function Tests: No results for input(s): AST, ALT, ALKPHOS, BILITOT, PROT, ALBUMIN in the last 168 hours. No results for input(s): LIPASE, AMYLASE in the last 168 hours. No results for input(s): AMMONIA in the last 168 hours. Coagulation  Profile: No results for input(s): INR, PROTIME in the last 168 hours. Cardiac Enzymes: No results for input(s): CKTOTAL, CKMB, CKMBINDEX, TROPONINI in the last 168 hours. BNP (last 3 results) No results for input(s): PROBNP in the last 8760 hours. HbA1C: No results for input(s): HGBA1C in the last 72 hours. CBG: No results for input(s): GLUCAP in the last 168 hours. Lipid Profile: No results for input(s): CHOL, HDL, LDLCALC, TRIG, CHOLHDL, LDLDIRECT in the last 72 hours. Thyroid Function Tests: No results for input(s): TSH, T4TOTAL, FREET4, T3FREE, THYROIDAB in the last 72 hours. Anemia Panel: No results for input(s): VITAMINB12, FOLATE, FERRITIN, TIBC, IRON, RETICCTPCT in the last 72 hours. Urine analysis:    Component Value Date/Time   COLORURINE YELLOW 04/02/2018 1720   APPEARANCEUR HAZY (A) 04/02/2018 1720   LABSPEC 1.015 04/02/2018 1720   PHURINE 6.0 04/02/2018 1720   GLUCOSEU 150 (A) 04/02/2018 1720   HGBUR LARGE (A) 04/02/2018 1720   HGBUR trace-intact 03/08/2010 1452   BILIRUBINUR NEGATIVE 04/02/2018 1720   KETONESUR NEGATIVE 04/02/2018 1720   PROTEINUR >=300 (A) 04/02/2018 1720   UROBILINOGEN 0.2 03/08/2010 1452   NITRITE NEGATIVE 04/02/2018 1720   LEUKOCYTESUR TRACE (A) 04/02/2018 1720    Creatinine Clearance: Estimated Creatinine Clearance: 10.3 mL/min (A) (by C-G formula based on SCr of 4.75 mg/dL (H)).  Sepsis Labs: @LABRCNTIP (procalcitonin:4,lacticidven:4) ) Recent Results (from the past 240 hour(s))  MRSA PCR Screening     Status: None   Collection Time: 04/02/18 10:15 PM  Result Value Ref Range Status   MRSA by PCR NEGATIVE NEGATIVE Final    Comment:        The GeneXpert MRSA Assay (FDA approved for NASAL specimens only), is one component of a comprehensive MRSA colonization surveillance program. It is not intended to diagnose MRSA infection nor to guide or monitor treatment for MRSA infections. Performed at [facility], [place] [address], [place], [place] [address]      Radiological Exams on Admission: Dg Chest Port 1 View  Result Date: 04/12/2018 CLINICAL DATA:  Shortness of breath. End-stage renal disease on dialysis. EXAM: PORTABLE CHEST 1 VIEW COMPARISON:  04/03/2018 FINDINGS: Right jugular central venous  dialysis catheter remains in appropriate position. Stable mild cardiomegaly. Diffuse mixed interstitial and airspace disease shows no significant change, suspicious for pulmonary edema. No evidence of superimposed pulmonary consolidation. IMPRESSION: Stable diffuse mixed interstitial and airspace disease, suspicious for diffuse pulmonary edema. Stable cardiomegaly. Electronically Signed   By: Earle Gell M.D.   On: 04/12/2018 11:33    EKG: Independently reviewed. Sinus tachycardia Ventricular premature complex Aberrant conduction of SV complex(es) Probable left ventricular hypertrophy Nonspecific T abnormalities, lateral leads Borderline prolonged QT interval  Assessment/Plan Principal Problem:   Acute respiratory failure with hypoxia (HCC) Active Problems:   ESRD (end stage renal disease) (HCC)   Elevated troponin   Acute pulmonary edema (HCC)   TOBACCO ABUSE   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[place])   Hypertension   Diabetes ([place])   Noncompliance of patient with renal dialysis ([place])   #1.acute on chronic respiratory failure with hypoxia. Reportedly oxygen sat level 84% when ems arrived. On home oxygen 2L. Likely related to acute pulmonary edema in the setting of known BOOP on prednisone and followed by pulmonology. She reports compliance with medications and dialysis. Chest x-ray suspicious for diffuse pulmonary edema. patient is on BiPAP in the emergency department resting comfortably. -admit -Continue BiPAP -Wean as able -Continue home prednisone -Dialysis per nephrology  #2. End-stage renal disease. Patient compliant with dialysis over this last week. Last session was 2 days ago for 4  hours. She scheduled for dialysis today. Evaluated by nephrology who recommended dialysis today -Defer management to nephrology  #3. Elevated troponin. Patient with a history of same. Today's level was a little lower than her norm. He denies chest pain at the time of admission. EKG without acute changes. -Cycle troponin -serial ekg -supportive therapy  #4..hypertension. Uncontrolled. Likely related to volume overload. Home medications contain no antihypertensives. Nitroglycerin drip initiated in the emergency department -Dialysis -Monitor -wean nitro drip as able -When necessary hydralazine  #5.diabetes. Recent hemoglobin A1c 6.8. Patient is also on daily steroids. Home medications include Lantus. Serum glucose 584 on admission. CO2 within the limits of normal. An ion gap 13. Insulin drip initiated in the emergency department -We'll discontinue the insulin drip -20 units novolog now -repeat cbg in 2 hours -SSI for optimal control -resume lantus  #6.BOOP. Evaluated by pulmonology 2 admissions ago. At that time prednisone was started. At her most recent discharge 8 days ago chest x-ray showed improvement. Chart review indicates patient is on a 6 month taper of prednisone. Outpatient follow-up with pulmonary was also scheduled -See above    DVT prophylaxis: heparin Code Status: full Family Communication: none present  Disposition Plan: home  Consults called: none  Admission status: inpatient    Radene Gunning NP [facility]  If 7PM-7AM, please contact night-coverage [email or web address] Password [email or web address]  04/12/2018, 1:13 PM

## 2018-04-12 NOTE — ED Provider Notes (Addendum)
[facility] EMERGENCY DEPARTMENT Provider Note   CSN: [id number] Arrival date & time: 04/12/18  1055     History   Chief Complaint Chief Complaint  Patient presents with  . Respiratory Distress   Level 5 caveat secondary to acuity of condition HPI Rhonda Lynch is a 62 y.o. female. HPILevel 5 due to acuity of condition  62 year old female CKD on dialysis for 2 months, hypertension, chronic oxygen therapy presents today with onset of shortness of breath.  She reports today she became dyspneic prior to her scheduled dialysis today at noon.  EMS arrived and found her very short of breath.  She was placed on CPAP in route and given sublingual nitro glycerin.  On arrival here in the ED she was placed on BiPAP.  EMS reports CBG reading high.  Past Medical History:  Diagnosis Date  . Chronic kidney disease   . ESRD (end stage renal disease) ([place])   . Hypertension   . Oxygen deficiency    2 L at night  . Substance abuse ([place])    has been clean for 4 months     Patient Active Problem List   Diagnosis Date Noted  . SVT (supraventricular tachycardia) ([place])   . Chronic renal failure   . Diabetes ([place]) 04/02/2018  . Hypertension   . Noncompliance of patient with renal dialysis ([place])   . Acute pulmonary edema (HCC)   . Elevated troponin 03/19/2018  . AKI (acute kidney injury) ([place])   . Fever   . 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 Diffuse pulmonary alveolar hemorrhage   . Hypoxemia   . Pulmonary infiltrate   . 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[place])   . ESRD (end stage renal disease) ([place]) 02/12/2018  . Acute respiratory failure ([place]) 02/12/2018  . Alcohol abuse 02/12/2018  . Anemia 02/12/2018  . Cocaine abuse ([place]) 02/12/2018  . Epigastric pain 02/12/2018  . Pneumonia 02/12/2018  . TOBACCO ABUSE 03/08/2010  . PNEUMONIA 03/08/2010  . UTI 03/08/2010  . ELEVATED BLOOD PRESSURE WITHOUT DIAGNOSIS OF HYPERTENSION 03/08/2010    Past  Surgical History:  Procedure Laterality Date  . AV FISTULA PLACEMENT Left 03/05/2018   Procedure: ARTERIOVENOUS (AV) FISTULA CREATION  LEFT UPPER EXTREMITY;  Surgeon: [name], MD;  Location: [facility];  Service: Vascular;  Laterality: Left;  . HEMATOMA EVACUATION Left 03/06/2018   Procedure: EVACUATION HEMATOMA LEFT ARM;  Surgeon: Angelia Mould, MD;  Location: [facility];  Service: Vascular;  Laterality: Left;  . IR FLUORO GUIDE CV LINE RIGHT  02/18/2018  . IR FLUORO GUIDE CV LINE RIGHT  02/26/2018  . IR US GUIDE VASC ACCESS RIGHT  02/18/2018     OB History   None      Home Medications    Prior to Admission medications   Medication Sig Start Date End Date Taking? Authorizing Provider  amoxicillin-clavulanate (AUGMENTIN) 500-125 MG tablet Take 1 tablet (500 mg total) by mouth daily at 6 PM. 04/05/18   Hosie Poisson, MD  benzonatate (TESSALON) 100 MG capsule Take 1 capsule (100 mg total) by mouth every 8 (eight) hours. 04/12/18   Pattricia Boss, MD  docusate sodium (COLACE) 50 MG capsule Take 1 capsule (50 mg total) by mouth daily as needed for mild constipation. 04/09/18   Clent Demark, PA-C  famotidine (PEPCID) 20 MG tablet Take 1 tablet (20 mg total) by mouth daily. 03/16/18   Elgergawy, Silver Huguenin, MD  Insulin Glargine (LANTUS) 100 UNIT/ML Solostar Pen  Inject 5 Units into the skin at bedtime. Patient taking differently: Inject 5 Units into the skin daily.  03/25/18   Clent Demark, PA-C  Insulin Pen Needle 31G X 5 MM MISC Use for once daily injections 03/25/18   Clent Demark, PA-C  Multiple Vitamin (MULTIVITAMIN WITH MINERALS) TABS tablet Take 1 tablet by mouth daily. 03/16/18   Elgergawy, Silver Huguenin, MD  predniSONE (DELTASONE) 20 MG tablet Take 2 tablets (40 mg total) by mouth daily with breakfast. 03/25/18   Clent Demark, PA-C  sulfamethoxazole-trimethoprim (BACTRIM DS,SEPTRA DS) 800-160 MG tablet Take 1 tablet by mouth 3 (three) times a week. Patient taking differently:  Take 1 tablet by mouth every Tuesday, Thursday, and Saturday at 6 PM.  03/17/18   Elgergawy, Silver Huguenin, MD  thiamine 100 MG tablet Take 1 tablet (100 mg total) by mouth daily. 03/25/18   Clent Demark, PA-C  traMADol (ULTRAM) 50 MG tablet Take 1 tablet (50 mg total) by mouth every 12 (twelve) hours as needed (refractory pain). 03/24/18   Patrecia Pour, MD    Family History Family History  Problem Relation Age of Onset  . Autoimmune disease Neg Hx     Social History Social History   Tobacco Use  . Smoking status: Current Every Day Smoker    Packs/day: 0.50  . Smokeless tobacco: Never Used  . Tobacco comment: trying to quit 04/02/2018  Substance Use Topics  . Alcohol use: Yes  . Drug use: Not Currently    Types: Cocaine     Allergies   Patient has no known allergies.   Review of Systems Review of Systems  All other systems reviewed and are negative.    Physical Exam Updated Vital Signs BP (!) 160/107 (BP Location: Right Arm)   Pulse 72   Temp 98.7 F (37.1 C) (Oral)   Resp (!) 32   Ht 1.549 m (5\' 1" )   SpO2 100%   BMI 26.74 kg/m   Physical Exam  Constitutional: She is oriented to person, place, and time. She appears well-developed and well-nourished. She appears distressed.  HENT:  Head: Normocephalic and atraumatic.  Eyes: Pupils are equal, round, and reactive to light. EOM are normal.  Neck: Normal range of motion. Neck supple.  Cardiovascular: Normal rate and regular rhythm.  Pulmonary/Chest: Accessory muscle usage present. Tachypnea noted. She is in respiratory distress. She has rhonchi in the right middle field, the right lower field, the left middle field and the left lower field.  Abdominal: Soft. Normal appearance. There is no tenderness.  Musculoskeletal: She exhibits edema.  Trace bilateral edema lower extremities  Neurological: She is alert and oriented to person, place, and time.  Skin: Skin is warm. Capillary refill takes less than 2 seconds.    Psychiatric: She has a normal mood and affect.  Nursing note and vitals reviewed.    ED Treatments / Results  Labs (all labs ordered are listed, but only abnormal results are displayed) Labs Reviewed  CBC  BASIC METABOLIC PANEL  I-STAT TROPONIN, ED    EKG None  Radiology No results found.  Procedures Procedures (including critical care time)  Medications Ordered in ED Medications - No data to display   Initial Impression / Assessment and Plan / ED Course  I have reviewed the triage vital signs and the nursing notes.  Pertinent labs & imaging results that were available during my care of the patient were reviewed by me and considered in my medical decision making (  see chart for details).    62 year old female on dialysis presents today with dyspnea, volume overload with pulmonary edema, hyperglycemia, and elevated troponin.  Plan dialysis for volume overload.  She is receiving insulin via dose stabilizer.  Troponin has been elevated and will likely need to be trended.  Plan consult to nephrology and hospitalist service.  She has recently been admitted with discharge for similar presentation with acute respiratory failure, cocaine abuse, and bronchiolitis obliterans with organizing pneumonia.  During that admission she was also noted to have elevated troponin and presented with acute pulmonary edema  Final Clinical Impressions(s) / ED Diagnoses   Final diagnoses:  Respiratory distress    ED Discharge Orders        Ordered    benzonatate (TESSALON) 100 MG capsule  Every 8 hours     04/12/18 1109       Pattricia Boss, MD 04/12/18 1332    Pattricia Boss, MD 04/12/18 1640

## 2018-04-12 NOTE — H&P (Signed)
History and Physical    Rhonda Lynch PYP:[id number] DOB: 03-12-1956 DOA: 04/12/2018  PCP: Clent Demark, PA-C Patient coming from: home  Chief Complaint: sob  HPI: Rhonda Lynch is a 62 y.o. female with medical history significant for  Cocaine and EtOH abuse, end-stage renal disease Tuesday Thursday Saturday dialysis patient as well as plasmapheresis has been started on steroids and cyclophosphamide, Streptococcus pneumonia, bruit, diabetes, upper tension, presents to emergency Department chief complaint respiratory distress. Initial evaluation reveals acute on chronic respiratory failure with hypoxia due to pulmonary edema in setting of uncontrolled blood pressure and BOOP as well as hyperglycemia and elevated troponin. [facility] are asked to admit  Formation is obtained from the patient and the chart. She was discharged 8 days ago after a 2 day hospitalization for same.she reports she was in her usual state of health until this morning she suddenly became short of breath.he denies chest pain palpitations headache dizziness syncope or near-syncope. She denies cough fever chills nausea vomiting. She reports compliance with her Thursday dialysis that she completed all treatment for 4 hours. He called EMS who reports she was in respiratory distress which improved on C Pap. She is also provided with sublingual nitroglycerin.  ED Course: in the emergency department she switched to BiPAP provided a nitroglycerin drip. The time of admission oxygen saturation greater than 90% on BiPAP no respiratory distress.she is afebrile hemodynamically stable with a blood pressure that is elevated.  Review of Systems: As per HPI otherwise all other systems reviewed and are negative.   Ambulatory Status: ambulates independantly  Past Medical History:  Diagnosis Date  . Chronic kidney disease   . ESRD (end stage renal disease) ([place])   . Hypertension   . Oxygen deficiency    2 L at night  .  Substance abuse ([place])    has been clean for 4 months     Past Surgical History:  Procedure Laterality Date  . AV FISTULA PLACEMENT Left 03/05/2018   Procedure: ARTERIOVENOUS (AV) FISTULA CREATION  LEFT UPPER EXTREMITY;  Surgeon: [name], MD;  Location: [facility];  Service: Vascular;  Laterality: Left;  . HEMATOMA EVACUATION Left 03/06/2018   Procedure: EVACUATION HEMATOMA LEFT ARM;  Surgeon: Angelia Mould, MD;  Location: [facility];  Service: Vascular;  Laterality: Left;  . IR FLUORO GUIDE CV LINE RIGHT  02/18/2018  . IR FLUORO GUIDE CV LINE RIGHT  02/26/2018  . IR US GUIDE VASC ACCESS RIGHT  02/18/2018    Social History   Socioeconomic History  . Marital status: Married    Spouse name: Not on file  . Number of children: Not on file  . Years of education: Not on file  . Highest education level: Not on file  Occupational History  . Not on file  Social Needs  . Financial resource strain: Not on file  . Food insecurity:    Worry: Not on file    Inability: Not on file  . Transportation needs:    Medical: Not on file    Non-medical: Not on file  Tobacco Use  . Smoking status: Current Every Day Smoker    Packs/day: 0.50  . Smokeless tobacco: Never Used  . Tobacco comment: trying to quit 04/02/2018  Substance and Sexual Activity  . Alcohol use: Yes  . Drug use: Not Currently    Types: Cocaine  . Sexual activity: Not Currently    Birth control/protection: None  Lifestyle  . Physical activity:  Days per week: Not on file    Minutes per session: Not on file  . Stress: Not on file  Relationships  . Social connections:    Talks on phone: Not on file    Gets together: Not on file    Attends religious service: Not on file    Active member of club or organization: Not on file    Attends meetings of clubs or organizations: Not on file    Relationship status: Not on file  . Intimate partner violence:    Fear of current or ex partner: Not on file    Emotionally abused: Not  on file    Physically abused: Not on file    Forced sexual activity: Not on file  Other Topics Concern  . Not on file  Social History Narrative  . Not on file    No Known Allergies  Family History  Problem Relation Age of Onset  . Autoimmune disease Neg Hx     Prior to Admission medications   Medication Sig Start Date End Date Taking? Authorizing Provider  amoxicillin-clavulanate (AUGMENTIN) 500-125 MG tablet Take 1 tablet (500 mg total) by mouth daily at 6 PM. 04/05/18   Hosie Poisson, MD  benzonatate (TESSALON) 100 MG capsule Take 1 capsule (100 mg total) by mouth every 8 (eight) hours. 04/12/18   Pattricia Boss, MD  docusate sodium (COLACE) 50 MG capsule Take 1 capsule (50 mg total) by mouth daily as needed for mild constipation. 04/09/18   Clent Demark, PA-C  famotidine (PEPCID) 20 MG tablet Take 1 tablet (20 mg total) by mouth daily. 03/16/18   Elgergawy, Silver Huguenin, MD  Insulin Glargine (LANTUS) 100 UNIT/ML Solostar Pen Inject 5 Units into the skin at bedtime. Patient taking differently: Inject 5 Units into the skin daily.  03/25/18   Clent Demark, PA-C  Insulin Pen Needle 31G X 5 MM MISC Use for once daily injections 03/25/18   Clent Demark, PA-C  Multiple Vitamin (MULTIVITAMIN WITH MINERALS) TABS tablet Take 1 tablet by mouth daily. 03/16/18   Elgergawy, Silver Huguenin, MD  predniSONE (DELTASONE) 20 MG tablet Take 2 tablets (40 mg total) by mouth daily with breakfast. 03/25/18   Clent Demark, PA-C  sulfamethoxazole-trimethoprim (BACTRIM DS,SEPTRA DS) 800-160 MG tablet Take 1 tablet by mouth 3 (three) times a week. Patient taking differently: Take 1 tablet by mouth every Tuesday, Thursday, and Saturday at 6 PM.  03/17/18   Elgergawy, Silver Huguenin, MD  thiamine 100 MG tablet Take 1 tablet (100 mg total) by mouth daily. 03/25/18   Clent Demark, PA-C  traMADol (ULTRAM) 50 MG tablet Take 1 tablet (50 mg total) by mouth every 12 (twelve) hours as needed (refractory pain). 03/24/18    Patrecia Pour, MD    Physical Exam: Vitals:   04/12/18 1119 04/12/18 1145 04/12/18 1200 04/12/18 1215  BP:  (!) 157/104 (!) 165/110 (!) 181/116  Pulse:  90 95 88  Resp:  (!) 38 (!) 25 (!) 31  Temp:      TempSrc:      SpO2:  100% 100% 100%  Height: 5\' 1"  (1.549 m)        General:  Appears calm and comfortable while lights are out and patient is resting. Upon waking her she immediately begins to cry and screaming in pain.remains on BiPAP Eyes:  PERRL, EOMI, normal lids, iris ENT:  grossly normal hearing, lips & tongue, mmm Neck:  no LAD, masses or thyromegaly Cardiovascular:  RRR, no m/r/g. 1+ LE edema bilaterally Respiratory: mild to moderate increased work of breathing with conversation mostly related to patient's screening. Breath sounds with fair air movement quite coarse throughout. Some fine crackles in the bases. Abdomen:  soft, ntnd, positive bowel sounds throughout no guarding or rebounding Skin:  no rash or induration seen on limited exam Musculoskeletal:  grossly normal tone BUE/BLE, good ROM, no bony abnormality Psychiatric:  grossly normal mood and affect, speech fluent and appropriate, AOx3 Neurologic:  CN 2-12 grossly intact, moves all extremities in coordinated fashion, sensation intact  Labs on Admission: I have personally reviewed following labs and imaging studies  CBC: Recent Labs  Lab 04/12/18 1139  WBC 6.2  HGB 8.9*  HCT 28.6*  MCV 85.6  PLT 601*   Basic Metabolic Panel: Recent Labs  Lab 04/12/18 1139  NA 133*  K 3.5  CL 97*  CO2 23  GLUCOSE 582*  BUN 52*  CREATININE 4.75*  CALCIUM 8.5*   GFR: Estimated Creatinine Clearance: 10.3 mL/min (A) (by C-G formula based on SCr of 4.75 mg/dL (H)). Liver Function Tests: No results for input(s): AST, ALT, ALKPHOS, BILITOT, PROT, ALBUMIN in the last 168 hours. No results for input(s): LIPASE, AMYLASE in the last 168 hours. No results for input(s): AMMONIA in the last 168 hours. Coagulation  Profile: No results for input(s): INR, PROTIME in the last 168 hours. Cardiac Enzymes: No results for input(s): CKTOTAL, CKMB, CKMBINDEX, TROPONINI in the last 168 hours. BNP (last 3 results) No results for input(s): PROBNP in the last 8760 hours. HbA1C: No results for input(s): HGBA1C in the last 72 hours. CBG: No results for input(s): GLUCAP in the last 168 hours. Lipid Profile: No results for input(s): CHOL, HDL, LDLCALC, TRIG, CHOLHDL, LDLDIRECT in the last 72 hours. Thyroid Function Tests: No results for input(s): TSH, T4TOTAL, FREET4, T3FREE, THYROIDAB in the last 72 hours. Anemia Panel: No results for input(s): VITAMINB12, FOLATE, FERRITIN, TIBC, IRON, RETICCTPCT in the last 72 hours. Urine analysis:    Component Value Date/Time   COLORURINE YELLOW 04/02/2018 1720   APPEARANCEUR HAZY (A) 04/02/2018 1720   LABSPEC 1.015 04/02/2018 1720   PHURINE 6.0 04/02/2018 1720   GLUCOSEU 150 (A) 04/02/2018 1720   HGBUR LARGE (A) 04/02/2018 1720   HGBUR trace-intact 03/08/2010 1452   BILIRUBINUR NEGATIVE 04/02/2018 1720   KETONESUR NEGATIVE 04/02/2018 1720   PROTEINUR >=300 (A) 04/02/2018 1720   UROBILINOGEN 0.2 03/08/2010 1452   NITRITE NEGATIVE 04/02/2018 1720   LEUKOCYTESUR TRACE (A) 04/02/2018 1720    Creatinine Clearance: Estimated Creatinine Clearance: 10.3 mL/min (A) (by C-G formula based on SCr of 4.75 mg/dL (H)).  Sepsis Labs: @LABRCNTIP (procalcitonin:4,lacticidven:4) ) Recent Results (from the past 240 hour(s))  MRSA PCR Screening     Status: None   Collection Time: 04/02/18 10:15 PM  Result Value Ref Range Status   MRSA by PCR NEGATIVE NEGATIVE Final    Comment:        The GeneXpert MRSA Assay (FDA approved for NASAL specimens only), is one component of a comprehensive MRSA colonization surveillance program. It is not intended to diagnose MRSA infection nor to guide or monitor treatment for MRSA infections. Performed at [facility], [place] [address], [place], [place] [address]      Radiological Exams on Admission: Dg Chest Port 1 View  Result Date: 04/12/2018 CLINICAL DATA:  Shortness of breath. End-stage renal disease on dialysis. EXAM: PORTABLE CHEST 1 VIEW COMPARISON:  04/03/2018 FINDINGS: Right jugular central venous  dialysis catheter remains in appropriate position. Stable mild cardiomegaly. Diffuse mixed interstitial and airspace disease shows no significant change, suspicious for pulmonary edema. No evidence of superimposed pulmonary consolidation. IMPRESSION: Stable diffuse mixed interstitial and airspace disease, suspicious for diffuse pulmonary edema. Stable cardiomegaly. Electronically Signed   By: Earle Gell M.D.   On: 04/12/2018 11:33    EKG: Independently reviewed. Sinus tachycardia Ventricular premature complex Aberrant conduction of SV complex(es) Probable left ventricular hypertrophy Nonspecific T abnormalities, lateral leads Borderline prolonged QT interval  Assessment/Plan Principal Problem:   Acute respiratory failure with hypoxia (HCC) Active Problems:   ESRD (end stage renal disease) (HCC)   Elevated troponin   Acute pulmonary edema (HCC)   TOBACCO ABUSE   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[place])   Hypertension   Diabetes ([place])   Noncompliance of patient with renal dialysis ([place])   #1.acute on chronic respiratory failure with hypoxia. Reportedly oxygen sat level 84% when ems arrived. On home oxygen 2L. Likely related to acute pulmonary edema in the setting of known BOOP on prednisone and followed by pulmonology. She reports compliance with medications and dialysis. Chest x-ray suspicious for diffuse pulmonary edema. patient is on BiPAP in the emergency department resting comfortably. -admit -Continue BiPAP -Wean as able -Continue home prednisone -Dialysis per nephrology  #2. End-stage renal disease. Patient compliant with dialysis over this last week. Last session was 2 days ago for 4  hours. She scheduled for dialysis today. Evaluated by nephrology who recommended dialysis today -Defer management to nephrology  #3. Elevated troponin. Patient with a history of same. Today's level was a little lower than her norm. He denies chest pain at the time of admission. EKG without acute changes. -Cycle troponin -serial ekg -supportive therapy  #4..hypertension. Uncontrolled. Likely related to volume overload. Home medications contain no antihypertensives. Nitroglycerin drip initiated in the emergency department -Dialysis -Monitor -wean nitro drip as able -When necessary hydralazine  #5.diabetes. Recent hemoglobin A1c 6.8. Patient is also on daily steroids. Home medications include Lantus. Serum glucose 584 on admission. CO2 within the limits of normal. An ion gap 13. Insulin drip initiated in the emergency department -We'll discontinue the insulin drip -20 units novolog now -repeat cbg in 2 hours -SSI for optimal control -resume lantus  #6.BOOP. Evaluated by pulmonology 2 admissions ago. At that time prednisone was started. At her most recent discharge 8 days ago chest x-ray showed improvement. Chart review indicates patient is on a 6 month taper of prednisone. Outpatient follow-up with pulmonary was also scheduled -See above    DVT prophylaxis: heparin Code Status: full Family Communication: none present  Disposition Plan: home  Consults called: none  Admission status: inpatient    Radene Gunning NP [facility]  If 7PM-7AM, please contact night-coverage [email or web address] Password [email or web address]  04/12/2018, 1:13 PM

## 2018-04-12 NOTE — Consult Note (Addendum)
[facility] Renal Consultation Note  Requesting MD: Dr. 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  She was hypertensive and in distress, prompting NTG gtt and bipap support.  Nephrology is consulted for urgent HD.  She says she didn't miss any dialysis or have any dietary indiscretion of sodium/fluids.  She has pulmonary edema on CXR. Unable to stand to weigh today.  Dry wt at dialysis changed recently from 67.5kg to 64.5kg.  Her last treatment was 8/1 per faxed records and her post weight was 65.5kg.  She denies fevers, chills, hemoptysis.  She is complaining of generalized abdominal pain which is not new.  CT A/P unrevealing 7/10 when she was having similar pain.   Per ED admission requested for troponin elevation and hyperglycemia to 582.    NYARA CAPELL is a 62 y.o. female.   Creatinine, Ser  Date/Time Value Ref Range Status  04/12/2018 11:39 AM 4.75 (H) 0.44 - 1.00 mg/dL Final  04/04/2018 08:19 AM 6.57 (H) 0.44 - 1.00 mg/dL Final  04/03/2018 02:36 AM 5.11 (H) 0.44 - 1.00 mg/dL Final  04/02/2018 09:33 PM 3.88 (H) 0.44 - 1.00 mg/dL Final    Comment:    DELTA CHECK NOTED  04/02/2018 02:45 PM 7.37 (H) 0.44 - 1.00 mg/dL Final  03/25/2018 10:14 AM 7.33 (H) 0.57 - 1.00 mg/dL Final  03/22/2018 04:53 AM 6.35 (H) 0.44 - 1.00 mg/dL Final    Comment:    DELTA CHECK NOTED  03/21/2018 03:29 AM 3.65 (H) 0.44 - 1.00 mg/dL Final    Comment:    DELTA CHECK NOTED  03/20/2018 05:34 AM 6.41 (H) 0.44 - 1.00 mg/dL Final  03/19/2018 06:34 PM 5.10 (H) 0.44 - 1.00 mg/dL Final  03/19/2018 06:20 PM 4.92 (H) 0.44 - 1.00 mg/dL Final  04/14/202019 08:00 AM 5.16 (H) 0.44 - 1.00 mg/dL Final  03/13/2018 07:57 AM 5.01 (H) 0.44 - 1.00 mg/dL Final  03/12/2018 12:47 PM 4.35 (H) 0.44 - 1.00 mg/dL Final  03/12/2018 03:44 AM  4.09 (H) 0.44 - 1.00 mg/dL Final  03/11/2018 02:56 AM 6.00 (H) 0.44 - 1.00 mg/dL Final  03/10/2018 05:13 PM 5.30 (H) 0.44 - 1.00 mg/dL Final  03/09/2018 08:43 AM 3.74 (H) 0.44 - 1.00 mg/dL Final  03/08/2018 02:31 AM 5.47 (H) 0.44 - 1.00 mg/dL Final    Comment:    DELTA CHECK NOTED  03/07/2018 03:16 AM 3.52 (H) 0.44 - 1.00 mg/dL Final  03/06/2018 03:12 AM 5.21 (H) 0.44 - 1.00 mg/dL Final  03/05/2018 03:28 AM 4.11 (H) 0.44 - 1.00 mg/dL Final    Comment:    DELTA CHECK NOTED  03/04/2018 03:25 AM 6.41 (H) 0.44 - 1.00 mg/dL Final  03/03/2018 05:35 PM 6.30 (H) 0.44 - 1.00 mg/dL Final  03/03/2018 04:27 AM 5.22 (H) 0.44 - 1.00 mg/dL Final  03/02/2018 03:55 AM 3.55 (H) 0.44 - 1.00 mg/dL Final  03/01/2018 09:10 PM 3.00 (H) 0.44 - 1.00 mg/dL Final    Comment:    DELTA CHECK NOTED  03/01/2018 03:17 AM 5.22 (H) 0.44 - 1.00 mg/dL Final  03/01/2018 03:17 AM 5.19 (H) 0.44 - 1.00 mg/dL Final  02/28/2018 03:40 PM 4.30 (H) 0.44 - 1.00 mg/dL Final  02/28/2018 02:53 AM 3.57 (H) 0.44 - 1.00 mg/dL Final    Comment:    DELTA CHECK NOTED  02/27/2018 03:05 AM 5.80 (  H) 0.44 - 1.00 mg/dL Final  02/26/2018 05:20 PM 4.80 (H) 0.44 - 1.00 mg/dL Final  02/26/2018 04:32 AM 4.65 (H) 0.44 - 1.00 mg/dL Final    Comment:    DELTA CHECK NOTED  02/25/2018 04:42 AM 7.09 (H) 0.44 - 1.00 mg/dL Final  02/24/2018 05:39 PM 6.50 (H) 0.44 - 1.00 mg/dL Final  02/23/2018 01:31 PM 4.74 (H) 0.44 - 1.00 mg/dL Final  02/23/2018 03:24 AM 4.47 (H) 0.44 - 1.00 mg/dL Final  02/22/2018 04:00 AM 5.45 (H) 0.44 - 1.00 mg/dL Final  02/21/2018 03:35 AM 4.35 (H) 0.44 - 1.00 mg/dL Final  02/20/2018 04:03 PM 3.10 (H) 0.44 - 1.00 mg/dL Final  02/20/2018 03:03 AM 5.61 (H) 0.44 - 1.00 mg/dL Final  02/19/2018 12:17 PM 4.50 (H) 0.44 - 1.00 mg/dL Final  02/19/2018 03:08 AM 7.45 (H) 0.44 - 1.00 mg/dL Final  02/18/2018 03:15 AM 7.80 (H) 0.44 - 1.00 mg/dL Final  02/17/2018 02:32 AM 7.75 (H) 0.44 - 1.00 mg/dL Final  02/16/2018 02:33 AM 7.83 (H) 0.44  - 1.00 mg/dL Final  02/15/2018 03:42 AM 7.70 (H) 0.44 - 1.00 mg/dL Final  02/14/2018 03:59 PM 7.85 (H) 0.44 - 1.00 mg/dL Final  02/14/2018 03:33 AM 7.72 (H) 0.44 - 1.00 mg/dL Final  02/13/2018 02:33 PM 7.32 (H) 0.44 - 1.00 mg/dL Final  02/13/2018 04:21 AM 7.23 (H) 0.44 - 1.00 mg/dL Final     PMHx:   Past Medical History:  Diagnosis Date  . Chronic kidney disease   . ESRD (end stage renal disease) ([place])   . Hypertension   . Oxygen deficiency    2 L at night  . Substance abuse ([place])    has been clean for 4 months     Past Surgical History:  Procedure Laterality Date  . AV FISTULA PLACEMENT Left 03/05/2018   Procedure: ARTERIOVENOUS (AV) FISTULA CREATION  LEFT UPPER EXTREMITY;  Surgeon: [name], MD;  Location: [facility];  Service: Vascular;  Laterality: Left;  . HEMATOMA EVACUATION Left 03/06/2018   Procedure: EVACUATION HEMATOMA LEFT ARM;  Surgeon: Angelia Mould, MD;  Location: [facility];  Service: Vascular;  Laterality: Left;  . IR FLUORO GUIDE CV LINE RIGHT  02/18/2018  . IR FLUORO GUIDE CV LINE RIGHT  02/26/2018  . IR US GUIDE VASC ACCESS RIGHT  02/18/2018    Family Hx:  Family History  Problem Relation Age of Onset  . Autoimmune disease Neg Hx     Social History:  reports that she has been smoking.  She has been smoking about 0.50 packs per day. She has never used smokeless tobacco. She reports that she drinks alcohol. She reports that she has current or past drug history. Drug: Cocaine.  Allergies: No Known Allergies  Medications: Prior to Admission medications   Medication Sig Start Date End Date Taking? Authorizing Provider  amoxicillin-clavulanate (AUGMENTIN) 500-125 MG tablet Take 1 tablet (500 mg total) by mouth daily at 6 PM. 04/05/18   Hosie Poisson, MD  benzonatate (TESSALON) 100 MG capsule Take 1 capsule (100 mg total) by mouth every 8 (eight) hours. 04/12/18   Pattricia Boss, MD  docusate sodium (COLACE) 50 MG capsule Take 1 capsule (50 mg total) by mouth  daily as needed for mild constipation. 04/09/18   Clent Demark, PA-C  famotidine (PEPCID) 20 MG tablet Take 1 tablet (20 mg total) by mouth daily. 03/16/18   Elgergawy, Silver Huguenin, MD  Insulin Glargine (LANTUS) 100 UNIT/ML Solostar Pen Inject 5 Units into the skin  at bedtime. Patient taking differently: Inject 5 Units into the skin daily.  03/25/18   Clent Demark, PA-C  Insulin Pen Needle 31G X 5 MM MISC Use for once daily injections 03/25/18   Clent Demark, PA-C  Multiple Vitamin (MULTIVITAMIN WITH MINERALS) TABS tablet Take 1 tablet by mouth daily. 03/16/18   Elgergawy, Silver Huguenin, MD  predniSONE (DELTASONE) 20 MG tablet Take 2 tablets (40 mg total) by mouth daily with breakfast. 03/25/18   Clent Demark, PA-C  sulfamethoxazole-trimethoprim (BACTRIM DS,SEPTRA DS) 800-160 MG tablet Take 1 tablet by mouth 3 (three) times a week. Patient taking differently: Take 1 tablet by mouth every Tuesday, Thursday, and Saturday at 6 PM.  03/17/18   Elgergawy, Silver Huguenin, MD  thiamine 100 MG tablet Take 1 tablet (100 mg total) by mouth daily. 03/25/18   Clent Demark, PA-C  traMADol (ULTRAM) 50 MG tablet Take 1 tablet (50 mg total) by mouth every 12 (twelve) hours as needed (refractory pain). 03/24/18   Patrecia Pour, MD    I have reviewed the patient's current medications.  Labs:  Results for orders placed or performed during the hospital encounter of 04/12/18 (from the past 48 hour(s))  CBC     Status: Abnormal   Collection Time: 04/12/18 11:39 AM  Result Value Ref Range   WBC 6.2 4.0 - 10.5 K/uL   RBC 3.34 (L) 3.87 - 5.11 MIL/uL   Hemoglobin 8.9 (L) 12.0 - 15.0 g/dL   HCT 28.6 (L) 36.0 - 46.0 %   MCV 85.6 78.0 - 100.0 fL   MCH 26.6 26.0 - 34.0 pg   MCHC 31.1 30.0 - 36.0 g/dL   RDW 17.5 (H) 11.5 - 15.5 %   Platelets 107 (L) 150 - 400 K/uL    Comment: PLATELET COUNT CONFIRMED BY SMEAR Performed at [facility], [address] [place], [place] [address]   Basic metabolic panel      Status: Abnormal   Collection Time: 04/12/18 11:39 AM  Result Value Ref Range   Sodium 133 (L) 135 - 145 mmol/L   Potassium 3.5 3.5 - 5.1 mmol/L   Chloride 97 (L) 98 - 111 mmol/L   CO2 23 22 - 32 mmol/L   Glucose, Bld 582 (HH) 70 - 99 mg/dL    Comment: CRITICAL RESULT CALLED TO, READ BACK BY AND VERIFIED WITH: Mali GROCE RN  AT 1223 04/12/18 BY WOOLLENK    BUN 52 (H) 8 - 23 mg/dL   Creatinine, Ser 4.75 (H) 0.44 - 1.00 mg/dL   Calcium 8.5 (L) 8.9 - 10.3 mg/dL   GFR calc non Af Amer 9 (L) >60 mL/min   GFR calc Af Amer 10 (L) >60 mL/min    Comment: (NOTE) The eGFR has been calculated using the CKD EPI equation. This calculation has not been validated in all clinical situations. eGFR's persistently <60 mL/min signify possible Chronic Kidney Disease.    Anion gap 13 5 - 15    Comment: Performed at [facility] [address], [place], [place] [address]  I-stat troponin, ED     Status: Abnormal   Collection Time: 04/12/18 11:49 AM  Result Value Ref Range   Troponin i, poc 0.12 (HH) 0.00 - 0.08 ng/mL   Comment NOTIFIED PHYSICIAN    Comment 3            Comment: Due to the release kinetics of cTnI, a negative result within the first hours of the onset  of symptoms does not rule out myocardial infarction with certainty. If myocardial infarction is still suspected, repeat the test at appropriate intervals.      ROS:  Pertinent items are noted in HPI.  Physical Exam: Vitals:   04/12/18 1200 04/12/18 1215  BP: (!) 165/110 (!) 181/116  Pulse: 95 88  Resp: (!) 25 (!) 31  Temp:    SpO2: 100% 100%     General: uncomfortably lying on stretcher at 45 deg on bipap HEENT:anicteric Eyes: MMM Neck: +JVD Heart: RRR, no rub Lungs: rales in bases, inc WOB Abdomen: TTP diffusely Extremities: 2+ pitting in feet, otherwise trace edema Skin: LLE with large hyperpigmented lesion on inner tibia - says this is old and unchanged; no other rashes Neuro:  Dialysis Rx:  [facility], 4  hours, CVC, 180, BFR 400, DFR 1.5AF, EDW 64.5kg, 2/2.25, heparin 2000u bolus, mircera 225 q2wks (not clear when last dose was), calcitriol 0.25 qtx  Assessment/Plan: 1.  Hypoxic respiratory failure secondary to pulmonary edema in ESRD patient.  There is no evidence for [facility] currently.  This appears to be simple volume overload.  Of note, her cytoxan was discontinued during her last admission for lack of efficacy.   --Dialysis now to challenge EDW further.  She agrees to weigh post treatment. --Extra dialysis treatment PRN but hopefully can just resume TTS schedule  2.  Anemia:  Hb 8.9 currently which is actually improved from last few here.  On mircera 270mg Q2wks outpatient.  If remains hospitalized [place] last dose was given.  Iron sat here 51% 03/22/18.  Won't recheck for now.   3.  BMM:  Continue outpatient calcitriol 0.280m 3/wk here. Can add on phos if she remains hospitalized. Doesn't looks like she requires a binder.   4.  Thrombocytopenia:  Stable. Chronic.    KrJannifer Hick 04/12/2018, 2:50 PM

## 2018-04-12 NOTE — ED Triage Notes (Signed)
Pt to ED via [facility] from home with c/o shortness of breath- pt is dialysis T, TH, SAT- went on Thursday for entire treatment- has graft in left arm that is not being used yet, has central cath in right subclavian for dialysis.  Pt arrived on C-Pap- switched to BiPap on arrival- lungs- rhonchi audible - received NTG x 3 enroute - CBG reading high.

## 2018-04-12 NOTE — Progress Notes (Signed)
Pt stated she does not want to wear a CPAP tonight.  Pt does have a CPAP at home but does not wear it regularly.

## 2018-04-13 LAB — COMPREHENSIVE METABOLIC PANEL
ALT: 24 U/L (ref 0–44)
AST: 23 U/L (ref 15–41)
Albumin: 2.5 g/dL — ABNORMAL LOW (ref 3.5–5.0)
Alkaline Phosphatase: 64 U/L (ref 38–126)
Anion gap: 13 (ref 5–15)
BUN: 24 mg/dL — AB (ref 8–23)
CALCIUM: 8.3 mg/dL — AB (ref 8.9–10.3)
CO2: 25 mmol/L (ref 22–32)
CREATININE: 3.13 mg/dL — AB (ref 0.44–1.00)
Chloride: 98 mmol/L (ref 98–111)
GFR calc Af Amer: 17 mL/min — ABNORMAL LOW (ref >60)
GFR, EST NON AFRICAN AMERICAN: 15 mL/min — AB (ref >60)
Glucose, Bld: 196 mg/dL — ABNORMAL HIGH (ref 70–99)
Potassium: 3.5 mmol/L (ref 3.5–5.1)
Sodium: 136 mmol/L (ref 135–145)
TOTAL PROTEIN: 4.8 g/dL — AB (ref 6.5–8.1)
Total Bilirubin: 0.6 mg/dL (ref 0.3–1.2)

## 2018-04-13 LAB — GLUCOSE, CAPILLARY
GLUCOSE-CAPILLARY: 232 mg/dL — AB (ref 70–99)
Glucose-Capillary: 242 mg/dL — ABNORMAL HIGH (ref 70–99)

## 2018-04-13 LAB — RAPID URINE DRUG SCREEN, HOSP PERFORMED
AMPHETAMINES: NOT DETECTED
Barbiturates: NOT DETECTED
Benzodiazepines: NOT DETECTED
COCAINE: NOT DETECTED
Opiates: NOT DETECTED
TETRAHYDROCANNABINOL: NOT DETECTED

## 2018-04-13 LAB — CBC
HCT: 27.3 % — ABNORMAL LOW (ref 36.0–46.0)
Hemoglobin: 8.7 g/dL — ABNORMAL LOW (ref 12.0–15.0)
MCH: 26.5 pg (ref 26.0–34.0)
MCHC: 31.9 g/dL (ref 30.0–36.0)
MCV: 83.2 fL (ref 78.0–100.0)
Platelets: 91 10*3/uL — ABNORMAL LOW (ref 150–400)
RBC: 3.28 MIL/uL — ABNORMAL LOW (ref 3.87–5.11)
RDW: 17.2 % — AB (ref 11.5–15.5)
WBC: 5.2 10*3/uL (ref 4.0–10.5)

## 2018-04-13 MED ORDER — CALCITRIOL 0.25 MCG PO CAPS: 0.2500 ug | ORAL_CAPSULE | ORAL | 0 refills | Status: DC

## 2018-04-13 MED ORDER — ONDANSETRON HCL 4 MG PO TABS: 4.0000 mg | ORAL_TABLET | Freq: Four times a day (QID) | ORAL | 0 refills | Status: DC | PRN

## 2018-04-13 MED ORDER — INSULIN ASPART 100 UNIT/ML ~~LOC~~ SOLN: 0.0000 [IU] | Freq: Every day | SUBCUTANEOUS | 11 refills | Status: DC

## 2018-04-13 MED ORDER — RENA-VITE PO TABS: 1.0000 | ORAL_TABLET | Freq: Every day | ORAL | 0 refills | Status: DC

## 2018-04-13 MED ORDER — RENA-VITE PO TABS: 1.0000 | ORAL_TABLET | Freq: Every day | ORAL | Status: DC

## 2018-04-13 MED ORDER — INSULIN GLARGINE 100 UNIT/ML ~~LOC~~ SOLN: 5.0000 [IU] | Freq: Every day | SUBCUTANEOUS | 11 refills | Status: DC

## 2018-04-13 MED ORDER — INSULIN ASPART 100 UNIT/ML ~~LOC~~ SOLN: 0.0000 [IU] | Freq: Three times a day (TID) | SUBCUTANEOUS | 11 refills | Status: DC

## 2018-04-13 MED ORDER — ZOLPIDEM TARTRATE 5 MG PO TABS: 5.0000 mg | ORAL_TABLET | Freq: Every evening | ORAL | 0 refills | Status: DC | PRN

## 2018-04-13 NOTE — Progress Notes (Signed)
Discharge instruction was given to pt. Pt is awaiting for her ride.  Idolina Primer, RN

## 2018-04-13 NOTE — Progress Notes (Addendum)
[facility] Progress Note   Subjective: Awake, alert, denies SOB. No C/Os. HD yest- removed 3200- tolerated well - feels better- ready for d/c- wants ambien to take at home  Objective Vitals:   04/13/18 0004 04/13/18 0311 04/13/18 0400 04/13/18 0715  BP: (!) 142/100 (!) 160/107 (!) 146/85 (!) 157/96  Pulse: 96 94  (!) 104  Resp: 20 20  18   Temp: 98.6 F (37 C) 98.5 F (36.9 C)  98.4 F (36.9 C)  TempSrc: Oral Oral  Oral  SpO2: 97% 98%  95%  Weight:  63.3 kg (139 lb 8 oz)    Height:       Physical Exam General: WN,WD NAD Heart: S1,S2, RRR Lungs: Bilateral breath sounds few crackles RLL otherwise CTAB Abdomen: Active BS Extremities: No LE edema.  Dialysis Access: RIJ [facility] drsg CDI. RUA AVF + bruit. Edema noted L hand. Hand is warm, no pain reported.     Additional Objective Labs: Basic Metabolic Panel: Recent Labs  Lab 04/12/18 1139 04/13/18 0351  NA 133* 136  K 3.5 3.5  CL 97* 98  CO2 23 25  GLUCOSE 582* 196*  BUN 52* 24*  CREATININE 4.75* 3.13*  CALCIUM 8.5* 8.3*   Liver Function Tests: Recent Labs  Lab 04/13/18 0351  AST 23  ALT 24  ALKPHOS 64  BILITOT 0.6  PROT 4.8*  ALBUMIN 2.5*   No results for input(s): LIPASE, AMYLASE in the last 168 hours. CBC: Recent Labs  Lab 04/12/18 1139 04/13/18 0351  WBC 6.2 5.2  HGB 8.9* 8.7*  HCT 28.6* 27.3*  MCV 85.6 83.2  PLT 107* 91*   Blood Culture    Component Value Date/Time   SDES URINE, CLEAN CATCH 03/19/2018 1738   SPECREQUEST NONE 03/19/2018 1738   CULT  03/19/2018 1738    NO GROWTH Performed at [facility], [place] [address] [place],  [address]    REPTSTATUS 03/20/2018 FINAL 03/19/2018 1738    Cardiac Enzymes: No results for input(s): CKTOTAL, CKMB, CKMBINDEX, TROPONINI in the last 168 hours. CBG: Recent Labs  Lab 04/12/18 1930 04/13/18 0715  GLUCAP 188* 232*   Iron Studies: No results for input(s): IRON, TIBC, TRANSFERRIN, FERRITIN in the last 72  hours. @lablastinr3 @ Studies/Results: Dg Chest Port 1 View  Result Date: 04/12/2018 CLINICAL DATA:  Shortness of breath. End-stage renal disease on dialysis. EXAM: PORTABLE CHEST 1 VIEW COMPARISON:  04/03/2018 FINDINGS: Right jugular central venous dialysis catheter remains in appropriate position. Stable mild cardiomegaly. Diffuse mixed interstitial and airspace disease shows no significant change, suspicious for pulmonary edema. No evidence of superimposed pulmonary consolidation. IMPRESSION: Stable diffuse mixed interstitial and airspace disease, suspicious for diffuse pulmonary edema. Stable cardiomegaly. Electronically Signed   By: Earle Gell M.D.   On: 04/12/2018 11:33   Medications: . sodium chloride    . sodium chloride     . amoxicillin-clavulanate  1 tablet Oral q1800  . calcitRIOL  0.25 mcg Oral QODAY  . Chlorhexidine Gluconate Cloth  6 each Topical Q0600  . famotidine  20 mg Oral Daily  . heparin  5,000 Units Subcutaneous Q8H  . insulin aspart  0-5 Units Subcutaneous QHS  . insulin aspart  0-9 Units Subcutaneous TID WC  . insulin glargine  5 Units Subcutaneous QHS  . predniSONE  40 mg Oral Q breakfast  . sulfamethoxazole-trimethoprim  1 tablet Oral Q T,Th,Sat-1800  . thiamine  100 mg Oral Daily     Dialysis Rx:  TTS [place], 4 hours,  CVC, 180, BFR 400, DFR 1.5AF, EDW 64.5kg, 2/2.25, heparin 2000u bolus, mircera 225 q2wks (not clear when last dose was), calcitriol 0.25 qtx  Assessment/Plan: 1.  Hypoxic respiratory failure secondary to pulmonary edema in ESRD patient.  There is no evidence for hemorrhage currently.  This appears to be simple volume overload.  Of note, her cytoxan was discontinued during her last admission for lack of efficacy. Continues on bactrim and prednisone.  H/O truncated dialysis treatments. Had urgent HD on admission,08/03-3 liters removed with resolution of symptoms.  2. ESRD-[place] T,Th, S via RIJ TDC. Reminded to stay full treatments. Next HD 04/15/18 as  OP . K+ 3.5.  3.  Anemia:  Hb 8.7.Recent ESA dose as OP-Micera 225 mcg IV 04/10/2018. Follow HGB>  4.  BMM:  Continue outpatient calcitriol 0.58mcg 3/wk here. Can add on phos if she remains hospitalized. Doesn't looks like she requires a binder. 5   Volume/Hypertension: Currently under OP EDW. Will lower EDW upon DC. BP high. No antihypertensive meds on med list. Says she has not been on antihypertensive meds-that BP is high because of prednisone. May need to add low dose amlodipine to better control BP, definitely needs EDW titrated down.  6.  Nutrition: albumin 2.5. Renal/Carb mod diet, prostat, renal vits. 7.  DM-per primary 8.  Thrombocytopenia:  Stable. Chronic.  9.  Swelling L hand distal to AVF-needs follow up with [facility] as OP. No steal symptoms. 10. H/O Polysubstance abuse.   Rhonda H. Brown NP-C 04/13/2018, 10:06 AM  [facility] [phone]  Patient seen and examined, agree with above note with above modifications. Feels better after HD with UF yesterday- needs EDW titrated down.  Am comfortable with discharge today knows to go to HD unit on Tuesday and take it easy with the fluids Rhonda Parish, MD 04/13/2018

## 2018-04-14 LAB — GLUCOSE, CAPILLARY
GLUCOSE-CAPILLARY: 335 mg/dL — AB (ref 70–99)
GLUCOSE-CAPILLARY: 177 mg/dL — AB (ref 70–99)

## 2018-04-14 NOTE — Telephone Encounter (Signed)
Patient Aware.  Rhonda Lynch

## 2018-04-15 ENCOUNTER — Telehealth (INDEPENDENT_AMBULATORY_CARE_PROVIDER_SITE_OTHER): Payer: Self-pay | Admitting: Physician Assistant

## 2018-04-15 NOTE — Telephone Encounter (Signed)
Ok, thank you for asking CMA first. Keep up the good work.

## 2018-04-15 NOTE — Telephone Encounter (Signed)
Francesco Sor from [facility] called requesting verbal orders for Social Work Evaluation. Front desk asked CMA and said it was ok to give verbal orders just to do a telephone note to inform PCP.  Please advice [phone]  Thank you Emmit Pomfret

## 2018-04-16 ENCOUNTER — Other Ambulatory Visit: Payer: Self-pay

## 2018-04-16 DIAGNOSIS — M7989 Other specified soft tissue disorders: Secondary | ICD-10-CM

## 2018-04-17 ENCOUNTER — Inpatient Hospital Stay (HOSPITAL_COMMUNITY)
Admission: EM | Admit: 2018-04-17 | Discharge: 2018-04-25 | DRG: 193 | Disposition: A | Discharge status: Home Health | Payer: Medicaid Other | Attending: Internal Medicine | Admitting: Internal Medicine

## 2018-04-17 ENCOUNTER — Emergency Department (HOSPITAL_COMMUNITY): Payer: Medicaid Other

## 2018-04-17 ENCOUNTER — Other Ambulatory Visit: Payer: Self-pay

## 2018-04-17 ENCOUNTER — Encounter (HOSPITAL_COMMUNITY): Payer: Self-pay

## 2018-04-17 DIAGNOSIS — N186 End stage renal disease: Secondary | ICD-10-CM | POA: Diagnosis present

## 2018-04-17 DIAGNOSIS — I120 Hypertensive chronic kidney disease with stage 5 chronic kidney disease or end stage renal disease: Secondary | ICD-10-CM | POA: Diagnosis present

## 2018-04-17 DIAGNOSIS — E119 Type 2 diabetes mellitus without complications: Secondary | ICD-10-CM

## 2018-04-17 DIAGNOSIS — R1011 Right upper quadrant pain: Secondary | ICD-10-CM | POA: Diagnosis present

## 2018-04-17 DIAGNOSIS — Z794 Long term (current) use of insulin: Secondary | ICD-10-CM

## 2018-04-17 DIAGNOSIS — R300 Dysuria: Secondary | ICD-10-CM | POA: Diagnosis present

## 2018-04-17 DIAGNOSIS — Z992 Dependence on renal dialysis: Secondary | ICD-10-CM

## 2018-04-17 DIAGNOSIS — B182 Chronic viral hepatitis C: Secondary | ICD-10-CM | POA: Diagnosis present

## 2018-04-17 DIAGNOSIS — R109 Unspecified abdominal pain: Secondary | ICD-10-CM | POA: Diagnosis present

## 2018-04-17 DIAGNOSIS — Y95 Nosocomial condition: Secondary | ICD-10-CM | POA: Diagnosis present

## 2018-04-17 DIAGNOSIS — D631 Anemia in chronic kidney disease: Secondary | ICD-10-CM | POA: Diagnosis present

## 2018-04-17 DIAGNOSIS — D696 Thrombocytopenia, unspecified: Secondary | ICD-10-CM | POA: Diagnosis present

## 2018-04-17 DIAGNOSIS — J8489 Other specified interstitial pulmonary diseases: Secondary | ICD-10-CM | POA: Diagnosis present

## 2018-04-17 DIAGNOSIS — Z9981 Dependence on supplemental oxygen: Secondary | ICD-10-CM

## 2018-04-17 DIAGNOSIS — Z79899 Other long term (current) drug therapy: Secondary | ICD-10-CM

## 2018-04-17 DIAGNOSIS — E1122 Type 2 diabetes mellitus with diabetic chronic kidney disease: Secondary | ICD-10-CM | POA: Diagnosis present

## 2018-04-17 DIAGNOSIS — D509 Iron deficiency anemia, unspecified: Secondary | ICD-10-CM | POA: Diagnosis present

## 2018-04-17 DIAGNOSIS — I776 Arteritis, unspecified: Secondary | ICD-10-CM | POA: Diagnosis present

## 2018-04-17 DIAGNOSIS — T402X5A Adverse effect of other opioids, initial encounter: Secondary | ICD-10-CM | POA: Diagnosis not present

## 2018-04-17 DIAGNOSIS — F17200 Nicotine dependence, unspecified, uncomplicated: Secondary | ICD-10-CM | POA: Diagnosis present

## 2018-04-17 DIAGNOSIS — IMO0001 Reserved for inherently not codable concepts without codable children: Secondary | ICD-10-CM | POA: Diagnosis present

## 2018-04-17 DIAGNOSIS — R319 Hematuria, unspecified: Secondary | ICD-10-CM | POA: Diagnosis present

## 2018-04-17 DIAGNOSIS — D649 Anemia, unspecified: Secondary | ICD-10-CM | POA: Diagnosis present

## 2018-04-17 DIAGNOSIS — J181 Lobar pneumonia, unspecified organism: Principal | ICD-10-CM | POA: Diagnosis present

## 2018-04-17 DIAGNOSIS — G4710 Hypersomnia, unspecified: Secondary | ICD-10-CM | POA: Diagnosis not present

## 2018-04-17 DIAGNOSIS — B1920 Unspecified viral hepatitis C without hepatic coma: Secondary | ICD-10-CM | POA: Diagnosis present

## 2018-04-17 LAB — CBC WITH DIFFERENTIAL/PLATELET
Abs Immature Granulocytes: 0.1 10*3/uL (ref 0.0–0.1)
Basophils Absolute: 0 10*3/uL (ref 0.0–0.1)
Basophils Relative: 0 %
EOS ABS: 0 10*3/uL (ref 0.0–0.7)
Eosinophils Relative: 0 %
HEMATOCRIT: 32.8 % — AB (ref 36.0–46.0)
Hemoglobin: 10 g/dL — ABNORMAL LOW (ref 12.0–15.0)
IMMATURE GRANULOCYTES: 1 %
LYMPHS ABS: 1.2 10*3/uL (ref 0.7–4.0)
Lymphocytes Relative: 21 %
MCH: 26.3 pg (ref 26.0–34.0)
MCHC: 30.5 g/dL (ref 30.0–36.0)
MCV: 86.3 fL (ref 78.0–100.0)
MONOS PCT: 5 %
Monocytes Absolute: 0.3 10*3/uL (ref 0.1–1.0)
NEUTROS PCT: 73 %
Neutro Abs: 3.9 10*3/uL (ref 1.7–7.7)
Platelets: 66 10*3/uL — ABNORMAL LOW (ref 150–400)
RBC: 3.8 MIL/uL — ABNORMAL LOW (ref 3.87–5.11)
RDW: 18.8 % — ABNORMAL HIGH (ref 11.5–15.5)
WBC: 5.4 10*3/uL (ref 4.0–10.5)

## 2018-04-17 LAB — ETHANOL: Alcohol, Ethyl (B): <10 mg/dL (ref <10)

## 2018-04-17 LAB — COMPREHENSIVE METABOLIC PANEL
ALT: 26 U/L (ref 0–44)
AST: 51 U/L — AB (ref 15–41)
Albumin: 2.5 g/dL — ABNORMAL LOW (ref 3.5–5.0)
Alkaline Phosphatase: 73 U/L (ref 38–126)
Anion gap: 10 (ref 5–15)
BUN: 10 mg/dL (ref 8–23)
CALCIUM: 7.9 mg/dL — AB (ref 8.9–10.3)
CO2: 28 mmol/L (ref 22–32)
Chloride: 98 mmol/L (ref 98–111)
Creatinine, Ser: 2.58 mg/dL — ABNORMAL HIGH (ref 0.44–1.00)
GFR calc Af Amer: 21 mL/min — ABNORMAL LOW (ref >60)
GFR, EST NON AFRICAN AMERICAN: 19 mL/min — AB (ref >60)
Glucose, Bld: 176 mg/dL — ABNORMAL HIGH (ref 70–99)
Potassium: 3.2 mmol/L — ABNORMAL LOW (ref 3.5–5.1)
Sodium: 136 mmol/L (ref 135–145)
Total Bilirubin: 0.6 mg/dL (ref 0.3–1.2)
Total Protein: 4.9 g/dL — ABNORMAL LOW (ref 6.5–8.1)

## 2018-04-17 LAB — I-STAT CHEM 8, ED
BUN: 11 mg/dL (ref 8–23)
CALCIUM ION: 0.94 mmol/L — AB (ref 1.15–1.40)
CREATININE: 2.5 mg/dL — AB (ref 0.44–1.00)
Chloride: 98 mmol/L (ref 98–111)
Glucose, Bld: 179 mg/dL — ABNORMAL HIGH (ref 70–99)
HCT: 33 % — ABNORMAL LOW (ref 36.0–46.0)
Hemoglobin: 11.2 g/dL — ABNORMAL LOW (ref 12.0–15.0)
Potassium: 3.7 mmol/L (ref 3.5–5.1)
Sodium: 136 mmol/L (ref 135–145)
TCO2: 32 mmol/L (ref 22–32)

## 2018-04-17 LAB — I-STAT TROPONIN, ED: TROPONIN I, POC: 0.1 ng/mL — AB (ref 0.00–0.08)

## 2018-04-17 LAB — CBG MONITORING, ED: Glucose-Capillary: 172 mg/dL — ABNORMAL HIGH (ref 70–99)

## 2018-04-17 LAB — LIPASE, BLOOD: LIPASE: 37 U/L (ref 11–51)

## 2018-04-17 MED ORDER — MORPHINE SULFATE (PF) 4 MG/ML IV SOLN
4.0000 mg | Freq: Once | INTRAVENOUS | Status: AC
  Administered 2018-04-17: 4 mg via INTRAVENOUS
  Filled 2018-04-17: qty 1

## 2018-04-17 NOTE — ED Notes (Signed)
Dr. Darl Householder notified regarding elevated trop of 0.10

## 2018-04-17 NOTE — ED Triage Notes (Signed)
Per [facility], pt c/o right sided rib cage pain x1 day. Pain increased with breathing, coughing, and palpation. EMS reports clear lung sounds. EMS gave 100 mcg of fentanyl. Pt reports pain now 8/10. T, TH, S dialysis patient and received treatment today. Pt 99% on RA, uses 2L [place] at home as needed. EMS placed pt on [place] for comfort.

## 2018-04-17 NOTE — ED Notes (Signed)
ED Provider at bedside. 

## 2018-04-17 NOTE — ED Notes (Signed)
Patient transported to CT 

## 2018-04-17 NOTE — ED Provider Notes (Signed)
[place] [facility] EMERGENCY DEPARTMENT Provider Note   CSN: [id number] Arrival date & time: 04/17/18  2110     History   Chief Complaint No chief complaint on file.   HPI Rhonda Lynch is a 62 y.o. female with CKD, ESRD on dialysis Tuesday, Thursday, and Saturday, and hypertension who presents due to 2 days of right flank pain.  She says that her symptoms are intermittent.  She says that changing positions, coughing, and palpating her side exacerbate her pain.  She says her pain is sharp and severe.  She has never felt pain like this before.  She denies fever and chills.  She reports having some mild dysuria.  She has no history of kidney stones.  She denies smoking alcohol.  She denies nausea, vomiting, and diarrhea.  HPI  Past Medical History:  Diagnosis Date  . Chronic kidney disease   . ESRD (end stage renal disease) ([place])   . Hypertension   . Oxygen deficiency    2 L at night  . Substance abuse ([place])    has been clean for 4 months     Patient Active Problem List   Diagnosis Date Noted  . Acute respiratory failure with hypoxia ([place]) 04/12/2018  . SVT (supraventricular tachycardia) ([place])   . Chronic renal failure   . Diabetes ([place]) 04/02/2018  . Hypertension   . Noncompliance of patient with renal dialysis ([place])   . Acute pulmonary edema ([facility])   . Elevated troponin 03/19/2018  . AKI (acute kidney injury) ([place])   . Fever   . 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 Diffuse pulmonary alveolar hemorrhage   . Hypoxemia   . Pulmonary infiltrate   . 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 ESRD (end stage renal disease) ([place]) 02/12/2018  . Acute respiratory failure ([place]) 02/12/2018  . Alcohol abuse 02/12/2018  . Anemia 02/12/2018  . Cocaine abuse ([place]) 02/12/2018  . Epigastric pain 02/12/2018  . Pneumonia 02/12/2018  . TOBACCO ABUSE 03/08/2010  . PNEUMONIA 03/08/2010  . UTI 03/08/2010  . ELEVATED BLOOD PRESSURE WITHOUT DIAGNOSIS OF  HYPERTENSION 03/08/2010    Past Surgical History:  Procedure Laterality Date  . AV FISTULA PLACEMENT Left 03/05/2018   Procedure: ARTERIOVENOUS (AV) FISTULA CREATION  LEFT UPPER EXTREMITY;  Surgeon: [name], MD;  Location: [facility];  Service: Vascular;  Laterality: Left;  . HEMATOMA EVACUATION Left 03/06/2018   Procedure: EVACUATION HEMATOMA LEFT ARM;  Surgeon: Angelia Mould, MD;  Location: [facility];  Service: Vascular;  Laterality: Left;  . IR FLUORO GUIDE CV LINE RIGHT  02/18/2018  . IR FLUORO GUIDE CV LINE RIGHT  02/26/2018  . IR US GUIDE VASC ACCESS RIGHT  02/18/2018     OB History   None      Home Medications    Prior to Admission medications   Medication Sig Start Date End Date Taking? Authorizing Provider  benzonatate (TESSALON) 100 MG capsule Take 1 capsule (100 mg total) by mouth every 8 (eight) hours. 04/12/18   Pattricia Boss, MD  calcitRIOL (ROCALTROL) 0.25 MCG capsule Take 1 capsule (0.25 mcg total) by mouth every other day. 04/14/18   Nita Sells, MD  insulin aspart (NOVOLOG) 100 UNIT/ML injection Inject 0-9 Units into the skin 3 (three) times daily with meals. 04/13/18   Nita Sells, MD  insulin aspart (NOVOLOG) 100 UNIT/ML injection Inject 0-5 Units into the skin at bedtime. 04/13/18   Nita Sells, MD  insulin glargine (LANTUS) 100  UNIT/ML injection Inject 0.05 mLs (5 Units total) into the skin at bedtime. 04/13/18   Nita Sells, MD  multivitamin (RENA-VIT) TABS tablet Take 1 tablet by mouth at bedtime. 04/13/18   Nita Sells, MD  ondansetron (ZOFRAN) 4 MG tablet Take 1 tablet (4 mg total) by mouth every 6 (six) hours as needed for nausea. 04/13/18   Nita Sells, MD  traMADol (ULTRAM) 50 MG tablet Take 1 tablet (50 mg total) by mouth every 12 (twelve) hours as needed (refractory pain). 03/24/18   Patrecia Pour, MD  zolpidem (AMBIEN) 5 MG tablet Take 1 tablet (5 mg total) by mouth at bedtime as needed for sleep. 04/13/18    Nita Sells, MD    Family History Family History  Problem Relation Age of Onset  . Autoimmune disease Neg Hx     Social History Social History   Tobacco Use  . Smoking status: Current Every Day Smoker    Packs/day: 0.50  . Smokeless tobacco: Never Used  . Tobacco comment: trying to quit 04/02/2018  Substance Use Topics  . Alcohol use: Yes  . Drug use: Not Currently    Types: Cocaine     Allergies   Patient has no known allergies.   Review of Systems Review of Systems Review of Systems   Constitutional  Negative for fever  Negative for chills  HENT  Negative for ear pain  Negative for sore throat  Negative for difficultly swallowing  Eyes  Negative for eye pain  Negative for visual disturbance  Respiratory  Negative for shortness of breath  Negative for cough  CV  Negative for chest pain  Negative for leg swelling  Abdomen  Negative for abdominal pain  Negative for nausea  Negative for vomiting  +right flank pain  MSK  Negative for extremity pain  Negative for back pain  Skin  Negative for rash  Negative for wound  Neuro  Negative for syncope  Negative for difficultly speaking  Psych  Negative for confusion   The remainder of the ROS was reviewed and negative except as documented above.      Physical Exam Updated Vital Signs BP (!) 146/100   Pulse (!) 106   Temp 99.2 F (37.3 C) (Oral)   Resp (!) 26   Ht 5\' 1"  (1.549 m)   SpO2 100%   BMI 26.36 kg/m   Physical Exam Physical Exam Constitutional  Nursing notes reviewed  Vital signs reviewed  HEENT  No obvious trauma  Supple without meningismus, mass, or overt JVD  EOMI  No scleral icterus or injection  Respiratory  Effort normal  CTAB  No respiratory distress  CV  Mild tachycardia  No obvious murmurs  Trace pitting edema  Abdomen  Soft  Non-tender  Non-distended  No peritonitis  Right flank tenderness  MSK  Atraumatic  No obvious  deformity  ROM appropriate  Skin  Warm  Dry  No rashes  Neuro  Awake and alert  EOMI  Moving all extremities  Psychiatric  Mood and affect normal        ED Treatments / Results  Labs (all labs ordered are listed, but only abnormal results are displayed) Labs Reviewed  CBC WITH DIFFERENTIAL/PLATELET  COMPREHENSIVE METABOLIC PANEL  ETHANOL  I-STAT TROPONIN, ED  I-STAT CHEM 8, ED  CBG MONITORING, ED    EKG EKG Interpretation  Date/Time:  Thursday April 17 2018 21:18:00 EDT Ventricular Rate:  111 PR Interval:    QRS Duration: 78 QT Interval:  343 QTC Calculation: 467 R Axis:   -33 Text Interpretation:  Sinus tachycardia Left ventricular hypertrophy Anterior Q waves, possibly due to LVH Nonspecific T abnormalities, lateral leads No significant change since last tracing Confirmed by Wandra Arthurs [phone]) on 04/17/2018 9:22:19 PM   Radiology No results found.  Procedures Procedures (including critical care time)  Medications Ordered in ED Medications - No data to display   Initial Impression / Assessment and Plan / ED Course  I have reviewed the triage vital signs and the nursing notes.  Pertinent labs & imaging results that were available during my care of the patient were reviewed by me and considered in my medical decision making (see chart for details).  Clinical Course as of Apr 19 27  Thu Apr 17, 2018  2151 Melony Overly presents with 2 days of right-sided flank pain as per above.The differential for this includes UTI, pyelonephritis, MSK, shingles, renal stone, and gallbladder pathology.  She has no rash or anterior abdominal tenderness.  I do not think that shingles or gallbladder is pathology likely.  She does report dysuria, making UTI more possible.  She is afebrile and has no CVA tenderness.  I doubt this is pyelonephritis.  I am concerned for a renal stone given her positional pain and location of her pain.  Her ECG reveals no acute ischemia  or dysrhythmia.  Labs and CT stone study ordered.   [NA]  Fri Apr 18, 2018  0027 She does not have a leukocytosis.  She has no significant abnormalities on CBC or CMP (chronic renal failure, on ESRD).  Her troponin is elevated to 0.10, which is at her baseline.  Her ECG reveals a sinus tachycardia without evidence of ischemia.  Her urine is pending. CT shows no acute abnormalities. She was given 4mg  morphine and the 50 of fentanyl for her pain.  She was admitted to the hospitalist service for intractable pain.   [NA]    Clinical Course User Index [NA] Alford Highland, MD     Final Clinical Impressions(s) / ED Diagnoses   Final diagnoses:  None    ED Discharge Orders    None       Alford Highland, MD 04/18/18 8127    Drenda Freeze, MD 04/18/18 2015

## 2018-04-18 ENCOUNTER — Encounter (HOSPITAL_COMMUNITY): Payer: Self-pay

## 2018-04-18 ENCOUNTER — Other Ambulatory Visit: Payer: Self-pay

## 2018-04-18 ENCOUNTER — Ambulatory Visit: Payer: Self-pay

## 2018-04-18 ENCOUNTER — Ambulatory Visit (HOSPITAL_COMMUNITY): Payer: MEDICAID

## 2018-04-18 DIAGNOSIS — IMO0001 Reserved for inherently not codable concepts without codable children: Secondary | ICD-10-CM | POA: Diagnosis present

## 2018-04-18 DIAGNOSIS — R109 Unspecified abdominal pain: Secondary | ICD-10-CM | POA: Diagnosis not present

## 2018-04-18 DIAGNOSIS — R10A1 Flank pain, right side: Secondary | ICD-10-CM | POA: Diagnosis present

## 2018-04-18 DIAGNOSIS — E1121 Type 2 diabetes mellitus with diabetic nephropathy: Secondary | ICD-10-CM | POA: Diagnosis not present

## 2018-04-18 DIAGNOSIS — E119 Type 2 diabetes mellitus without complications: Secondary | ICD-10-CM

## 2018-04-18 DIAGNOSIS — Z794 Long term (current) use of insulin: Secondary | ICD-10-CM

## 2018-04-18 DIAGNOSIS — N186 End stage renal disease: Secondary | ICD-10-CM | POA: Diagnosis not present

## 2018-04-18 LAB — CBC WITH DIFFERENTIAL/PLATELET
ABS IMMATURE GRANULOCYTES: 0.1 10*3/uL (ref 0.0–0.1)
Basophils Absolute: 0 10*3/uL (ref 0.0–0.1)
Basophils Relative: 0 %
Eosinophils Absolute: 0 10*3/uL (ref 0.0–0.7)
Eosinophils Relative: 0 %
HEMATOCRIT: 30.9 % — AB (ref 36.0–46.0)
Hemoglobin: 9.6 g/dL — ABNORMAL LOW (ref 12.0–15.0)
IMMATURE GRANULOCYTES: 2 %
LYMPHS ABS: 1.1 10*3/uL (ref 0.7–4.0)
Lymphocytes Relative: 21 %
MCH: 26.4 pg (ref 26.0–34.0)
MCHC: 31.1 g/dL (ref 30.0–36.0)
MCV: 84.9 fL (ref 78.0–100.0)
MONOS PCT: 6 %
Monocytes Absolute: 0.3 10*3/uL (ref 0.1–1.0)
NEUTROS ABS: 3.6 10*3/uL (ref 1.7–7.7)
Neutrophils Relative %: 71 %
PLATELETS: 63 10*3/uL — AB (ref 150–400)
RBC: 3.64 MIL/uL — ABNORMAL LOW (ref 3.87–5.11)
RDW: 18.3 % — ABNORMAL HIGH (ref 11.5–15.5)
WBC: 5.2 10*3/uL (ref 4.0–10.5)

## 2018-04-18 LAB — URINALYSIS, ROUTINE W REFLEX MICROSCOPIC
Bilirubin Urine: NEGATIVE
Glucose, UA: 50 mg/dL — AB
Ketones, ur: NEGATIVE mg/dL
Nitrite: NEGATIVE
Protein, ur: >=300 mg/dL — AB
RBC / HPF: >50 RBC/hpf — ABNORMAL HIGH (ref 0–5)
SPECIFIC GRAVITY, URINE: 1.017 (ref 1.005–1.030)
WBC, UA: >50 WBC/hpf — ABNORMAL HIGH (ref 0–5)
pH: 8 (ref 5.0–8.0)

## 2018-04-18 LAB — COMPREHENSIVE METABOLIC PANEL
ALT: 41 U/L (ref 0–44)
ANION GAP: 12 (ref 5–15)
AST: 90 U/L — ABNORMAL HIGH (ref 15–41)
Albumin: 2.3 g/dL — ABNORMAL LOW (ref 3.5–5.0)
Alkaline Phosphatase: 231 U/L — ABNORMAL HIGH (ref 38–126)
BUN: 11 mg/dL (ref 8–23)
CHLORIDE: 98 mmol/L (ref 98–111)
CO2: 30 mmol/L (ref 22–32)
Calcium: 7.9 mg/dL — ABNORMAL LOW (ref 8.9–10.3)
Creatinine, Ser: 3.19 mg/dL — ABNORMAL HIGH (ref 0.44–1.00)
GFR calc Af Amer: 17 mL/min — ABNORMAL LOW (ref >60)
GFR calc non Af Amer: 14 mL/min — ABNORMAL LOW (ref >60)
Glucose, Bld: 211 mg/dL — ABNORMAL HIGH (ref 70–99)
POTASSIUM: 2.9 mmol/L — AB (ref 3.5–5.1)
SODIUM: 140 mmol/L (ref 135–145)
Total Bilirubin: 1.4 mg/dL — ABNORMAL HIGH (ref 0.3–1.2)
Total Protein: 4.8 g/dL — ABNORMAL LOW (ref 6.5–8.1)

## 2018-04-18 LAB — GLUCOSE, CAPILLARY
GLUCOSE-CAPILLARY: 172 mg/dL — AB (ref 70–99)
Glucose-Capillary: 230 mg/dL — ABNORMAL HIGH (ref 70–99)
Glucose-Capillary: 201 mg/dL — ABNORMAL HIGH (ref 70–99)
Glucose-Capillary: 131 mg/dL — ABNORMAL HIGH (ref 70–99)

## 2018-04-18 LAB — TROPONIN I
TROPONIN I: 0.07 ng/mL — AB (ref <0.03)
TROPONIN I: 0.07 ng/mL — AB (ref <0.03)
TROPONIN I: 0.08 ng/mL — AB (ref <0.03)

## 2018-04-18 MED ORDER — TRAMADOL HCL 50 MG PO TABS
50.0000 mg | ORAL_TABLET | Freq: Four times a day (QID) | ORAL | Status: DC | PRN
  Administered 2018-04-18: 50 mg via ORAL
  Filled 2018-04-18: qty 1

## 2018-04-18 MED ORDER — CALCITRIOL 0.25 MCG PO CAPS
0.2500 ug | ORAL_CAPSULE | ORAL | Status: DC
  Administered 2018-04-18 – 2018-04-24 (×3): 0.25 ug via ORAL
  Filled 2018-04-18 (×4): qty 1

## 2018-04-18 MED ORDER — FENTANYL CITRATE (PF) 100 MCG/2ML IJ SOLN
50.0000 ug | Freq: Once | INTRAMUSCULAR | Status: AC
  Administered 2018-04-18: 50 ug via INTRAVENOUS
  Filled 2018-04-18: qty 2

## 2018-04-18 MED ORDER — NEPRO/CARBSTEADY PO LIQD
237.0000 mL | Freq: Two times a day (BID) | ORAL | Status: DC
  Administered 2018-04-18 – 2018-04-23 (×7): 237 mL via ORAL
  Filled 2018-04-18 (×12): qty 237

## 2018-04-18 MED ORDER — RENA-VITE PO TABS
1.0000 | ORAL_TABLET | Freq: Every day | ORAL | Status: DC
  Administered 2018-04-18 – 2018-04-24 (×7): 1 via ORAL
  Filled 2018-04-18 (×7): qty 1

## 2018-04-18 MED ORDER — INSULIN GLARGINE 100 UNIT/ML ~~LOC~~ SOLN
5.0000 [IU] | Freq: Every day | SUBCUTANEOUS | Status: DC
  Administered 2018-04-18 – 2018-04-24 (×7): 5 [IU] via SUBCUTANEOUS
  Filled 2018-04-18 (×8): qty 0.05

## 2018-04-18 MED ORDER — ZOLPIDEM TARTRATE 5 MG PO TABS
5.0000 mg | ORAL_TABLET | Freq: Every evening | ORAL | Status: DC | PRN
  Administered 2018-04-21 – 2018-04-24 (×3): 5 mg via ORAL
  Filled 2018-04-18 (×3): qty 1

## 2018-04-18 MED ORDER — OXYCODONE HCL 5 MG PO TABS
5.0000 mg | ORAL_TABLET | ORAL | Status: DC | PRN
  Administered 2018-04-18 – 2018-04-19 (×4): 5 mg via ORAL
  Filled 2018-04-18 (×5): qty 1

## 2018-04-18 MED ORDER — ENSURE ENLIVE PO LIQD: 237.0000 mL | Freq: Two times a day (BID) | ORAL | Status: DC

## 2018-04-18 MED ORDER — SODIUM CHLORIDE 0.9 % IV SOLN
1.0000 g | INTRAVENOUS | Status: DC
  Administered 2018-04-18 – 2018-04-20 (×3): 1 g via INTRAVENOUS
  Filled 2018-04-18 (×3): qty 10

## 2018-04-18 MED ORDER — INSULIN ASPART 100 UNIT/ML ~~LOC~~ SOLN
0.0000 [IU] | Freq: Three times a day (TID) | SUBCUTANEOUS | Status: DC
  Administered 2018-04-18: 2 [IU] via SUBCUTANEOUS
  Administered 2018-04-18: 3 [IU] via SUBCUTANEOUS
  Administered 2018-04-18 – 2018-04-20 (×4): 1 [IU] via SUBCUTANEOUS
  Administered 2018-04-20: 2 [IU] via SUBCUTANEOUS
  Administered 2018-04-21: 1 [IU] via SUBCUTANEOUS
  Administered 2018-04-21: 2 [IU] via SUBCUTANEOUS
  Administered 2018-04-22: 5 [IU] via SUBCUTANEOUS
  Administered 2018-04-23: 1 [IU] via SUBCUTANEOUS
  Administered 2018-04-23: 2 [IU] via SUBCUTANEOUS
  Administered 2018-04-24: 1 [IU] via SUBCUTANEOUS
  Administered 2018-04-25 (×2): 2 [IU] via SUBCUTANEOUS

## 2018-04-18 MED ORDER — CYCLOBENZAPRINE HCL 10 MG PO TABS
10.0000 mg | ORAL_TABLET | Freq: Three times a day (TID) | ORAL | Status: DC
  Administered 2018-04-18 – 2018-04-25 (×18): 10 mg via ORAL
  Filled 2018-04-18 (×18): qty 1

## 2018-04-18 MED ORDER — ONDANSETRON HCL 4 MG/2ML IJ SOLN: 4.0000 mg | Freq: Four times a day (QID) | INTRAMUSCULAR | Status: DC | PRN

## 2018-04-18 MED ORDER — ACETAMINOPHEN 650 MG RE SUPP: 650.0000 mg | Freq: Four times a day (QID) | RECTAL | Status: DC | PRN

## 2018-04-18 MED ORDER — ONDANSETRON HCL 4 MG PO TABS: 4.0000 mg | ORAL_TABLET | Freq: Four times a day (QID) | ORAL | Status: DC | PRN

## 2018-04-18 MED ORDER — ACETAMINOPHEN 325 MG PO TABS: 650.0000 mg | ORAL_TABLET | Freq: Four times a day (QID) | ORAL | Status: DC | PRN

## 2018-04-18 MED ORDER — CHLORHEXIDINE GLUCONATE CLOTH 2 % EX PADS
6.0000 | MEDICATED_PAD | Freq: Every day | CUTANEOUS | Status: DC
  Administered 2018-04-20 – 2018-04-21 (×2): 6 via TOPICAL

## 2018-04-18 NOTE — H&P (Signed)
History and Physical    Rhonda Lynch:[id number] DOB: [birth year] DOA: 04/17/2018  PCP: Clent Demark, PA-C  Patient coming from: Home.  Chief Complaint: Right flank pain.  HPI: Rhonda Lynch is a 62 y.o. 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  Denies nausea vomiting diarrhea or any blood in the urine or productive cough.  Denies fever chills.  ED Course: In the ER patient had CT abdomen pelvis does not show anything acute.  UA shows blood and bacteria.  On exam patient has tenderness on the right flank area.  Chest x-ray does not show any infiltrates.  Mildly febrile.  Was given fentanyl in the ER and since patient is having fever and tachycardia and given the comorbidities admitted for further observation.  Review of Systems: As per HPI, rest all negative.   Past Medical History:  Diagnosis Date  . Chronic kidney disease   . ESRD (end stage renal disease) ([place])   . Hypertension   . Oxygen deficiency    2 L at night  . Substance abuse ([place])    has been clean for 4 months     Past Surgical History:  Procedure Laterality Date  . AV FISTULA PLACEMENT Left 03/05/2018   Procedure: ARTERIOVENOUS (AV) FISTULA CREATION  LEFT UPPER EXTREMITY;  Surgeon: [name], MD;  Location: [facility];  Service: Vascular;  Laterality: Left;  . HEMATOMA EVACUATION Left 03/06/2018   Procedure: EVACUATION HEMATOMA LEFT ARM;  Surgeon: Angelia Mould, MD;  Location: [facility];  Service: Vascular;  Laterality: Left;  . IR FLUORO GUIDE CV LINE RIGHT  02/18/2018  . IR FLUORO GUIDE CV LINE RIGHT  02/26/2018  . IR US GUIDE VASC ACCESS RIGHT  02/18/2018     reports that she has been smoking. She has been smoking  about 0.50 packs per day. She has never used smokeless tobacco. She reports that she drinks alcohol. She reports that she has current or past drug history. Drug: Cocaine.  No Known Allergies  Family History  Problem Relation Age of Onset  . Autoimmune disease Neg Hx     Prior to Admission medications   Medication Sig Start Date End Date Taking? Authorizing Provider  calcitRIOL (ROCALTROL) 0.25 MCG capsule Take 1 capsule (0.25 mcg total) by mouth every other day. 04/14/18  Yes Nita Sells, MD  insulin aspart (NOVOLOG) 100 UNIT/ML injection Inject 0-9 Units into the skin 3 (three) times daily with meals. 04/13/18  Yes Nita Sells, MD  insulin aspart (NOVOLOG) 100 UNIT/ML injection Inject 0-5 Units into the skin at bedtime. 04/13/18  Yes Nita Sells, MD  insulin glargine (LANTUS) 100 UNIT/ML injection Inject 0.05 mLs (5 Units total) into the skin at bedtime. 04/13/18  Yes Nita Sells, MD  multivitamin (RENA-VIT) TABS tablet Take 1 tablet by mouth at bedtime. 04/13/18  Yes Nita Sells, MD  ondansetron (ZOFRAN) 4 MG tablet Take 1 tablet (4 mg total) by mouth every 6 (six) hours as needed for nausea. 04/13/18  Yes Nita Sells, MD  traMADol (ULTRAM) 50 MG tablet Take 1 tablet (50 mg total) by mouth every 12 (twelve) hours as needed (refractory pain). 03/24/18  Yes Patrecia Pour, MD  zolpidem Lorrin Mais) 5  MG tablet Take 1 tablet (5 mg total) by mouth at bedtime as needed for sleep. 04/13/18  Yes Nita Sells, MD  benzonatate (TESSALON) 100 MG capsule Take 1 capsule (100 mg total) by mouth every 8 (eight) hours. Patient not taking: Reported on 04/18/2018 04/12/18   Pattricia Boss, MD    Physical Exam: Vitals:   04/18/18 0200 04/18/18 0215 04/18/18 0319 04/18/18 0400  BP: (!) 149/84 133/82  (!) 150/97  Pulse:  94  98  Resp: (!) 22     Temp:    99.5 F (37.5 C)  TempSrc:    Oral  SpO2:  100%  94%  Weight:   61.2 kg   Height:   5\' 1"  (1.549 m)        Constitutional: Moderately built and nourished. Vitals:   04/18/18 0200 04/18/18 0215 04/18/18 0319 04/18/18 0400  BP: (!) 149/84 133/82  (!) 150/97  Pulse:  94  98  Resp: (!) 22     Temp:    99.5 F (37.5 C)  TempSrc:    Oral  SpO2:  100%  94%  Weight:   61.2 kg   Height:   5\' 1"  (1.549 m)    Eyes: Anicteric no pallor. ENMT: No discharge from the ears eyes nose or mouth. Neck: No mass palpated no neck rigidity. Respiratory: No rhonchi or crepitations. Cardiovascular: S1-S2 heard no murmurs appreciated. Abdomen: Soft mild tenderness in the right flank area. Musculoskeletal: No edema. Skin: No rash. Neurologic: Alert awake oriented to time place and person.  Moves all extremities. Psychiatric: Appears normal per normal affect.   Labs on Admission: I have personally reviewed following labs and imaging studies  CBC: Recent Labs  Lab 04/12/18 1139 04/13/18 0351 04/17/18 2147 04/17/18 2155  WBC 6.2 5.2 5.4  --   NEUTROABS  --   --  3.9  --   HGB 8.9* 8.7* 10.0* 11.2*  HCT 28.6* 27.3* 32.8* 33.0*  MCV 85.6 83.2 86.3  --   PLT 107* 91* 66*  --    Basic Metabolic Panel: Recent Labs  Lab 04/12/18 1139 04/13/18 0351 04/17/18 2147 04/17/18 2155  NA 133* 136 136 136  K 3.5 3.5 3.2* 3.7  CL 97* 98 98 98  CO2 23 25 28   --   GLUCOSE 582* 196* 176* 179*  BUN 52* 24* 10 11  CREATININE 4.75* 3.13* 2.58* 2.50*  CALCIUM 8.5* 8.3* 7.9*  --    GFR: Estimated Creatinine Clearance: 19.1 mL/min (A) (by C-G formula based on SCr of 2.5 mg/dL (H)). Liver Function Tests: Recent Labs  Lab 04/13/18 0351 04/17/18 2147  AST 23 51*  ALT 24 26  ALKPHOS 64 73  BILITOT 0.6 0.6  PROT 4.8* 4.9*  ALBUMIN 2.5* 2.5*   Recent Labs  Lab 04/17/18 2147  LIPASE 37   No results for input(s): AMMONIA in the last 168 hours. Coagulation Profile: No results for input(s): INR, PROTIME in the last 168 hours. Cardiac Enzymes: No results for input(s): CKTOTAL, CKMB, CKMBINDEX,  TROPONINI in the last 168 hours. BNP (last 3 results) No results for input(s): PROBNP in the last 8760 hours. HbA1C: No results for input(s): HGBA1C in the last 72 hours. CBG: Recent Labs  Lab 04/12/18 1651 04/12/18 1930 04/13/18 0715 04/13/18 1135 04/17/18 2147  GLUCAP 177* 188* 232* 242* 172*   Lipid Profile: No results for input(s): CHOL, HDL, LDLCALC, TRIG, CHOLHDL, LDLDIRECT in the last 72 hours. Thyroid Function Tests: No results for input(s): TSH, T4TOTAL,  FREET4, T3FREE, THYROIDAB in the last 72 hours. Anemia Panel: No results for input(s): VITAMINB12, FOLATE, FERRITIN, TIBC, IRON, RETICCTPCT in the last 72 hours. Urine analysis:    Component Value Date/Time   COLORURINE AMBER (A) 04/17/2018 2312   APPEARANCEUR CLOUDY (A) 04/17/2018 2312   LABSPEC 1.017 04/17/2018 2312   PHURINE 8.0 04/17/2018 2312   GLUCOSEU 50 (A) 04/17/2018 2312   HGBUR MODERATE (A) 04/17/2018 2312   HGBUR trace-intact 03/08/2010 1452   BILIRUBINUR NEGATIVE 04/17/2018 2312   KETONESUR NEGATIVE 04/17/2018 2312   PROTEINUR >=300 (A) 04/17/2018 2312   UROBILINOGEN 0.2 03/08/2010 1452   NITRITE NEGATIVE 04/17/2018 2312   LEUKOCYTESUR TRACE (A) 04/17/2018 2312   Sepsis Labs: @LABRCNTIP (procalcitonin:4,lacticidven:4) )No results found for this or any previous visit (from the past 240 hour(s)).   Radiological Exams on Admission: Dg Chest 2 View  Result Date: 04/17/2018 CLINICAL DATA:  Right-sided rib cage pain x1 day. EXAM: CHEST - 2 VIEW COMPARISON:  04/12/2018 FINDINGS: Cardiomegaly is noted. No alveolar consolidation is seen. Improved aeration of the lungs is identified with interval decrease in alveolar airspace opacities bilaterally. No new pulmonary consolidation nor overt pulmonary edema is visualized. No effusion or pneumothorax. Nonaneurysmal atherosclerotic aorta is noted. Right IJ dialysis catheter tip terminates at the cavoatrial junction, stable in appearance. Cholecystectomy clips are  present in the right upper quadrant. 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 IMPRESSION: Stable cardiomegaly with aortic atherosclerosis. Interval clearing of pulmonary edema. No new alveolar consolidations are noted. Electronically Signed   By: Ashley Royalty M.D.   On: 04/17/2018 22:43   Ct Renal Stone Study  Result Date: 04/17/2018 CLINICAL DATA:  62 y/o  F; flank pain. EXAM: CT ABDOMEN AND PELVIS WITHOUT CONTRAST TECHNIQUE: Multidetector CT imaging of the abdomen and pelvis was performed following the standard protocol without IV contrast. COMPARISON:  03/19/2018 CT abdomen and pelvis. FINDINGS: Lower chest: Near complete resolution of ground-glass opacities in the lung bases. Hepatobiliary: No focal liver abnormality is seen. Status post cholecystectomy. No biliary dilatation. Pancreas: Unremarkable. No pancreatic ductal dilatation or surrounding inflammatory changes. Spleen: Normal in size without focal abnormality. Adrenals/Urinary Tract: Stable left perinephric fat stranding. Normal adrenal glands. No hydronephrosis or urinary stone disease. Normal bladder. Stomach/Bowel: Stomach is within normal limits. Appendix appears normal. No evidence of bowel wall thickening, distention, or inflammatory changes. Vascular/Lymphatic: Aortic atherosclerosis. No enlarged abdominal or pelvic lymph nodes. 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 Other: No abdominal wall hernia or abnormality. No abdominopelvic ascites. Musculoskeletal: No fracture is seen. Lumbar spondylosis with L4-5 grade 1 anterolisthesis and prominent lower lumbar facet arthrosis. IMPRESSION: 1. No acute process identified. No hydronephrosis or urinary stone disease. 2. Near complete resolution of ground-glass opacities at the lung bases. 3. Stable mild left perinephric fat stranding. 4. 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 Electronically Signed   By: Kristine Garbe M.D.   On: 04/17/2018 23:10    EKG: Independently reviewed.   Sinus tachycardia.  Assessment/Plan Principal Problem:   Abdominal pain Active Problems:   ESRD (end stage renal disease) ([facility])   Anemia   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[place])   DM (diabetes mellitus), type 2 with renal complications ([facility])   Right flank pain    1. Right flank pain -CT scan does not show anything acute.  Patient still complains of pain.  Was given 1 dose of fentanyl.  Has history of drug abuse previously patient states he has not taken any cocaine last 4 to 5  months.  Urine shows some RBCs WBCs and bacteria.  Good urine cultures keep on ceftriaxone for now.  I have increased her tramadol dose from every 12 to every 6 as needed. 2. ESRD on hemodialysis on Tuesday Thursday and Saturday.  Patient states she had dialysis yesterday.  May need to get nephrology input with regarding the right flank pain. 3. History of BOOP -was on prednisone until recently.  Patient states he is off prednisone and was advised to be off.   4. Anemia and thrombocytopenia appears to be chronic follow CBC. 5. Diabetes mellitus type 2 on Lantus insulin sliding scale.   DVT prophylaxis: SCDs. Code Status: Full code. Family Communication: Discussed with patient. Disposition Plan: Home. Consults called: None. Admission status: Observation.   Rise Patience MD [facility] Pager [phone].  If 7PM-7AM, please contact night-coverage [email or web address] Password [email or web address]  04/18/2018, 4:59 AM

## 2018-04-18 NOTE — Progress Notes (Addendum)
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  Patient also has lung involvement with Boop and was on prednisone but which was discontinued last month.  Vitals:   04/18/18 0215 04/18/18 0400  BP: 133/82 (!) 150/97  Pulse: 94 98  Resp:    Temp:  99.5 F (37.5 C)  SpO2: 100% 94%    On exam patient complains of right flank pain, worse on coughing.  A/P Likely muscular flank pain, start Flexeril 10 mg 3 times daily, oxycodone 5 mg every 6 hours as needed Patient also has hematuria which has been chronic.  Started on IV antibiotics.  Urine culture has been obtained. Follow urine culture results. Also consulted nephrology for hemodialysis in a.m. Continue Lantus sliding scale insulin with NovoLog.    [facility] [phone]

## 2018-04-18 NOTE — Care Management Note (Signed)
Case Management Note  Patient Details  Name: Rhonda Lynch MRN: [id number] Date of Birth: March 01, 1956  Subjective/Objective:                    Action/Plan: Provided and explained [place] letter.   Patient states her Med[employer] and Medicare are both pending.   Patient has a follow up appointment at [facility] 05/13/18 at 910 am.  Patient voiced understanding to all of above.   Expected Discharge Date:                  Expected Discharge Plan:  [place]  In-House Referral:  Financial Counselor  Discharge planning Services  CM Consult, [place] Program, Medication Assistance  Post Acute Care Choice:    Choice offered to:  Patient  DME Arranged:  N/A DME Agency:  NA  HH Arranged:  NA HH Agency:  NA  Status of Service:  In process, will continue to follow  If discussed at Long Length of Stay Meetings, dates discussed:    Additional Comments:  Marilu Favre, RN 04/18/2018, 10:33 AM

## 2018-04-18 NOTE — ED Notes (Signed)
ED Provider at bedside. 

## 2018-04-18 NOTE — Progress Notes (Signed)
Initial Nutrition Assessment  DOCUMENTATION CODES:   Not applicable  INTERVENTION:   -Continue renal MVI daily -Continue Nepro Shake po BID, each supplement provides 425 kcal and 19 grams protein  NUTRITION DIAGNOSIS:   Increased nutrient needs related to chronic illness as evidenced by estimated needs.  GOAL:   Patient will meet greater than or equal to 90% of their needs  MONITOR:   PO intake, Supplement acceptance, Labs, Weight trends, Skin, I & O's  REASON FOR ASSESSMENT:   Malnutrition Screening Tool    ASSESSMENT:   ATZIRY BARANSKI is a 62 y.o. 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    Pt admitted with rt flank pain and abdominal pain.   Case discussed with RN, who reports pt is tolerating diet well. When RD entered her room, noted she consumed approximately 505 of Nepro shake.   Spoke with pt, who was sleepy at time of visit. Pt closed her eyes for the majority of the interview and fell asleep quickly after RD asked questions. Pt reports a general decline in health over the past 3 months ("I found out I had DM, kidney problems, and so much other stuff in such a short time"). Pt appears overwhelmed by her multiple illnesses. She estimates she hs lost 20-25# over the past 3 months.   She reports sporadic intake. She reports she always consumes breakfast ("I eat a lot of eggs") and sometimes will consume lunch and/or dinner. She reveals she will usually only eats breakfast on HD days, but will eat 3 meals per day on no-HD days. She is unsure if she received supplements at HD center.   Reviewed wt hx; pt has experienced a 15.9% wt loss x 1 month, which is significant for time frame,  however, suspect some wt loss is related to fluid changes due to HD. Pt unsure of her dry wt.   Discussed importance of good meal and supplement intake to promote healing. Pt endorses struggling with food choices ("I mix and match because I get mixed messages about what I can get"). Discussed with pt that renal diet is very individualized based upon lab values and to use renal RD at outpatient center as a resource. Pt was not alert enough to provide extensive education at this time.   Last Hgb A1c: 6.8 (04/02/18). PTA DM medications 0-9 units insulin asapart TID, 0-5 units insulin aspart q HS, 5 units insulin glargine q HS).   Labs reviewed: K: 2.9, CBGS: 201-230 (inpatient orders for glycemic control are 0-9 units insulin aspart TID with meals and 5 units insulin glargine q HS).   NUTRITION - FOCUSED PHYSICAL EXAM:    Most Recent Value  Orbital Region  No depletion  Upper Arm Region  No depletion  Thoracic and Lumbar Region  No depletion  Buccal Region  No depletion  Temple Region  No depletion  Clavicle Bone Region  No depletion  Clavicle and Acromion Bone Region  No depletion  Scapular Bone Region  No depletion  Dorsal Hand  No depletion  Patellar Region  No depletion  Anterior Thigh Region  No depletion  Posterior Calf Region  No depletion  Edema (RD Assessment)  None  Hair  Reviewed  Eyes  Reviewed  Mouth  Reviewed  Skin  Reviewed  Nails  Reviewed       Diet Order:   Diet Order            Diet renal/carb modified with fluid restriction Diet-HS Snack? Nothing; Fluid restriction: 1200 mL Fluid; Room service appropriate? Yes; Fluid consistency: Thin  Diet effective now              EDUCATION NEEDS:   Education needs have been addressed  Skin:  Skin Assessment: Reviewed RN Assessment  Last BM:  PTA  Height:   Ht Readings from Last 1 Encounters:  04/18/18 5\' 1"  (1.549 m)    Weight:   Wt Readings from Last 1 Encounters:  04/18/18 61.2 kg    Ideal Body  Weight:  47.7 kg  BMI:  Body mass index is 25.49 kg/m.  Estimated Nutritional Needs:   Kcal:  1500-1700  Protein:  65-80 grams  Fluid:  per MD    Deandra Gadson A. Jimmye Norman, RD, LDN, CDE Pager: [phone] After hours Pager: [phone]

## 2018-04-18 NOTE — Social Work (Signed)
CSW acknowledging consult for medication access. CSW unable to provide direct financial assistance for medication access, will provide pt with other assistance program information. RN Case Management may be able to provide [facility] program assistance at discharge.   [place], [place] Work [phone]

## 2018-04-18 NOTE — ED Notes (Signed)
Hal Hope, MD at bedside

## 2018-04-18 NOTE — Progress Notes (Signed)
Pt seen this am re: need for HD.  NO need today, vol is ok and lytes are wnl.  Plan HD tomorrow if patient still here.  Pt is OBS status, full consult to follow if patient changed to IP status.     TTS HD 4h  Hep 2000 last edw was 64kg approx  Kelly Splinter MD [employer] pgr [phone]   04/18/2018, 5:01 PM

## 2018-04-18 NOTE — Progress Notes (Signed)
CRITICAL VALUE ALERT  Critical Value:  Troponin 0.07  Date & Time Notied:  04/18/2018 at [place]  Provider Notified: Hal Hope  Orders Received/Actions taken: waiting for return call

## 2018-04-18 NOTE — Progress Notes (Signed)
Patient arrived on unit at approximately 0300 alert and oriented x4 continuing to have some abd/flank pain. Patient transferred self to hospital bed without difficulty.

## 2018-04-19 ENCOUNTER — Observation Stay (HOSPITAL_COMMUNITY): Payer: Medicaid Other

## 2018-04-19 DIAGNOSIS — R109 Unspecified abdominal pain: Secondary | ICD-10-CM

## 2018-04-19 DIAGNOSIS — N186 End stage renal disease: Secondary | ICD-10-CM | POA: Diagnosis not present

## 2018-04-19 DIAGNOSIS — R1030 Lower abdominal pain, unspecified: Secondary | ICD-10-CM | POA: Diagnosis not present

## 2018-04-19 DIAGNOSIS — E1121 Type 2 diabetes mellitus with diabetic nephropathy: Secondary | ICD-10-CM | POA: Diagnosis not present

## 2018-04-19 LAB — BLOOD CULTURE ID PANEL (REFLEXED)
ACINETOBACTER BAUMANNII: NOT DETECTED
CANDIDA ALBICANS: NOT DETECTED
CANDIDA PARAPSILOSIS: NOT DETECTED
CANDIDA TROPICALIS: NOT DETECTED
Candida glabrata: NOT DETECTED
Candida krusei: NOT DETECTED
ENTEROBACTERIACEAE SPECIES: NOT DETECTED
Enterobacter cloacae complex: NOT DETECTED
Enterococcus species: NOT DETECTED
Escherichia coli: NOT DETECTED
HAEMOPHILUS INFLUENZAE: NOT DETECTED
KLEBSIELLA OXYTOCA: NOT DETECTED
KLEBSIELLA PNEUMONIAE: NOT DETECTED
Listeria monocytogenes: NOT DETECTED
Neisseria meningitidis: NOT DETECTED
PROTEUS SPECIES: NOT DETECTED
Pseudomonas aeruginosa: NOT DETECTED
STAPHYLOCOCCUS SPECIES: NOT DETECTED
STREPTOCOCCUS SPECIES: NOT DETECTED
Serratia marcescens: NOT DETECTED
Staphylococcus aureus (BCID): NOT DETECTED
Streptococcus agalactiae: NOT DETECTED
Streptococcus pneumoniae: NOT DETECTED
Streptococcus pyogenes: NOT DETECTED

## 2018-04-19 LAB — URINE CULTURE

## 2018-04-19 LAB — RENAL FUNCTION PANEL
ALBUMIN: 2.2 g/dL — AB (ref 3.5–5.0)
Anion gap: 13 (ref 5–15)
BUN: 26 mg/dL — AB (ref 8–23)
CO2: 28 mmol/L (ref 22–32)
CREATININE: 4.62 mg/dL — AB (ref 0.44–1.00)
Calcium: 7.9 mg/dL — ABNORMAL LOW (ref 8.9–10.3)
Chloride: 95 mmol/L — ABNORMAL LOW (ref 98–111)
GFR calc Af Amer: 11 mL/min — ABNORMAL LOW (ref >60)
GFR calc non Af Amer: 9 mL/min — ABNORMAL LOW (ref >60)
GLUCOSE: 223 mg/dL — AB (ref 70–99)
Phosphorus: 5.3 mg/dL — ABNORMAL HIGH (ref 2.5–4.6)
Potassium: 3.5 mmol/L (ref 3.5–5.1)
SODIUM: 136 mmol/L (ref 135–145)

## 2018-04-19 LAB — CBC
HCT: 31.7 % — ABNORMAL LOW (ref 36.0–46.0)
Hemoglobin: 9.7 g/dL — ABNORMAL LOW (ref 12.0–15.0)
MCH: 26 pg (ref 26.0–34.0)
MCHC: 30.6 g/dL (ref 30.0–36.0)
MCV: 85 fL (ref 78.0–100.0)
PLATELETS: 64 10*3/uL — AB (ref 150–400)
RBC: 3.73 MIL/uL — ABNORMAL LOW (ref 3.87–5.11)
RDW: 18.3 % — AB (ref 11.5–15.5)
WBC: 7.9 10*3/uL (ref 4.0–10.5)

## 2018-04-19 LAB — GLUCOSE, CAPILLARY
GLUCOSE-CAPILLARY: 127 mg/dL — AB (ref 70–99)
Glucose-Capillary: 122 mg/dL — ABNORMAL HIGH (ref 70–99)
Glucose-Capillary: 126 mg/dL — ABNORMAL HIGH (ref 70–99)

## 2018-04-19 LAB — HEPATITIS B SURFACE ANTIGEN: HEP B S AG: NEGATIVE

## 2018-04-19 MED ORDER — METRONIDAZOLE IN NACL 5-0.79 MG/ML-% IV SOLN
500.0000 mg | Freq: Three times a day (TID) | INTRAVENOUS | Status: DC
  Administered 2018-04-19 – 2018-04-20 (×3): 500 mg via INTRAVENOUS
  Filled 2018-04-19 (×4): qty 100

## 2018-04-19 MED ORDER — IOPAMIDOL (ISOVUE-300) INJECTION 61%
INTRAVENOUS | Status: AC
  Filled 2018-04-19: qty 30

## 2018-04-19 MED ORDER — ORAL CARE MOUTH RINSE
15.0000 mL | Freq: Two times a day (BID) | OROMUCOSAL | Status: DC
  Administered 2018-04-19 – 2018-04-25 (×7): 15 mL via OROMUCOSAL

## 2018-04-19 MED ORDER — LIDOCAINE-PRILOCAINE 2.5-2.5 % EX CREA: 1.0000 "application " | TOPICAL_CREAM | CUTANEOUS | Status: DC | PRN

## 2018-04-19 MED ORDER — HEPARIN SODIUM (PORCINE) 1000 UNIT/ML DIALYSIS: 2000.0000 [IU] | Freq: Once | INTRAMUSCULAR | Status: DC

## 2018-04-19 MED ORDER — IOHEXOL 300 MG/ML  SOLN
100.0000 mL | Freq: Once | INTRAMUSCULAR | Status: AC | PRN
  Administered 2018-04-19: 100 mL via INTRAVENOUS

## 2018-04-19 MED ORDER — SODIUM CHLORIDE 0.9 % IV SOLN: 100.0000 mL | INTRAVENOUS | Status: DC | PRN

## 2018-04-19 MED ORDER — PENTAFLUOROPROP-TETRAFLUOROETH EX AERO: 1.0000 "application " | INHALATION_SPRAY | CUTANEOUS | Status: DC | PRN

## 2018-04-19 NOTE — Progress Notes (Addendum)
PHARMACY - PHYSICIAN COMMUNICATION CRITICAL VALUE ALERT - BLOOD CULTURE IDENTIFICATION (BCID)  Rhonda Lynch is an 62 y.o. female who presented to [facility] on 04/17/2018 with a chief complaint of right flank pain  Assessment:  Pt presented with flank pain. She was on cytoxan but off for the last month. Lab called to let us know about the 1/2 GPC in the blood but nothing on BCID. Likely a contaminant.   Name of physician (or Provider) Contacted: Dr. Darrick Meigs  Current antibiotics: Ceftriaxone  Changes to prescribed antibiotics recommended:  Cont current ABX  Results for orders placed or performed during the hospital encounter of 04/17/18  Blood Culture ID Panel (Reflexed) (Collected: 04/18/2018  5:30 AM)  Result Value Ref Range   Enterococcus species NOT DETECTED NOT DETECTED   Listeria monocytogenes NOT DETECTED NOT DETECTED   Staphylococcus species NOT DETECTED NOT DETECTED   Staphylococcus aureus NOT DETECTED NOT DETECTED   Streptococcus species NOT DETECTED NOT DETECTED   Streptococcus agalactiae NOT DETECTED NOT DETECTED   Streptococcus pneumoniae NOT DETECTED NOT DETECTED   Streptococcus pyogenes NOT DETECTED NOT DETECTED   Acinetobacter baumannii NOT DETECTED NOT DETECTED   Enterobacteriaceae species NOT DETECTED NOT DETECTED   Enterobacter cloacae complex NOT DETECTED NOT DETECTED   Escherichia coli NOT DETECTED NOT DETECTED   Klebsiella oxytoca NOT DETECTED NOT DETECTED   Klebsiella pneumoniae NOT DETECTED NOT DETECTED   Proteus species NOT DETECTED NOT DETECTED   Serratia marcescens NOT DETECTED NOT DETECTED   Haemophilus influenzae NOT DETECTED NOT DETECTED   Neisseria meningitidis NOT DETECTED NOT DETECTED   Pseudomonas aeruginosa NOT DETECTED NOT DETECTED   Candida albicans NOT DETECTED NOT DETECTED   Candida glabrata NOT DETECTED NOT DETECTED   Candida krusei NOT DETECTED NOT DETECTED   Candida parapsilosis NOT DETECTED NOT DETECTED   Candida tropicalis NOT DETECTED  NOT DETECTED    Onnie Boer, PharmD, BCIDP, AAHIVP, CPP Infectious Disease Pharmacist Pager: [phone] 04/19/2018 5:55 PM

## 2018-04-19 NOTE — Progress Notes (Signed)
Paged MD per itching, no PRN meds

## 2018-04-19 NOTE — Progress Notes (Signed)
[facility]  PROGRESS NOTE  Rhonda Lynch HQI:[id number] DOB: 1956/02/11 DOA: 04/17/2018 PCP: Clent Demark, PA-C   Brief HPI:   62 year old female with a history of ANCA related vasculitis, on hemodialysis came to hospital with right flank pain.  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  Patient denies any symptoms of cough or shortness of breath.  Chest x-ray is clear.  CT renal stone study showed no acute abnormality. She was started on Flexeril, Percocet as needed for pain   Subjective   This morning patient says the pain is more of her right upper quadrant.  Patient seen during hemodialysis.   Assessment/Plan:     1. Right upper quadrant pain-concern for biliary pathology.  Patient has elevated bilirubin now, 1.4.  Alk phos elevated to 291.  Will obtain CT of abdomen and pelvis also obtain right upper quadrant ultrasound.  Continue with IV ceftriaxone.  We will add Flagyl 2. ESRD-on hemodialysis, Tuesday Thursday and Saturday.  Nephrology has been consulted.  Patient getting dialysis today. 3. 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  Prednisone has been tapered off as per patient. 4. Anemia, thrombocytopenia-chronic, platelet count of 64 5. Diabetes mellitus, type II-continue Lantus, sliding scale insulin with NovoLog.    DVT prophylaxis: SCDs  Code Status: Full code  Family Communication: No family at bedside  Disposition Plan: likely home when medically ready for discharge   Consultants:  Nephrology  Procedures:  None   Antibiotics:   Anti-infectives (From admission, onward)   Start     Dose/Rate Route Frequency Ordered Stop   04/18/18 0600  cefTRIAXone (ROCEPHIN) 1 g in sodium chloride 0.9 % 100 mL IVPB     1 g 200 mL/hr over 30 Minutes Intravenous Every 24 hours 04/18/18 0458         Objective   Vitals:   04/19/18 1100 04/19/18 1130 04/19/18 1155 04/19/18 1319  BP:  134/82 (!) 131/91 129/90 (!) 153/88  Pulse: (!) 102 (!) 105 (!) 104 (!) 105  Resp: '18 18 19 18  '$ Temp:   98.4 F (36.9 C) 98.2 F (36.8 C)  TempSrc:   Oral Oral  SpO2:   99% 100%  Weight:   61.2 kg   Height:        Intake/Output Summary (Last 24 hours) at 04/19/2018 1433 Last data filed at 04/19/2018 1323 Gross per 24 hour  Intake 519.42 ml  Output 200 ml  Net 319.42 ml   Filed Weights   04/18/18 0319 04/19/18 0745 04/19/18 1155  Weight: 61.2 kg 61.8 kg 61.2 kg     Physical Examination:    General: Appears in no acute distress  Cardiovascular: S1-S2, regular  Respiratory: Clear to auscultation bilaterally  Abdomen: Soft, right upper quadrant tenderness to palpation  Extremities: No edema to lower extremities  Neurologic: Alert, oriented x3, no focal deficit noted     Data Reviewed: I have personally reviewed following labs and imaging studies  CBG: Recent Labs  Lab 04/18/18 0623 04/18/18 0743 04/18/18 1201 04/18/18 1656 04/19/18 1317  GLUCAP 230* 201* 172* 131* 122*    CBC: Recent Labs  Lab 04/13/18 0351 04/17/18 2147 04/17/18 2155 04/18/18 0529 04/19/18 0830  WBC 5.2 5.4  --  5.2 7.9  NEUTROABS  --  3.9  --  3.6  --   HGB 8.7* 10.0* 11.2* 9.6* 9.7*  HCT 27.3* 32.8* 33.0* 30.9* 31.7*  MCV 83.2 86.3  --  84.9 85.0  PLT 91* 66*  --  63* 64*    Basic Metabolic Panel: Recent Labs  Lab 04/13/18 0351 04/17/18 2147 04/17/18 2155 04/18/18 0529 04/19/18 0830  NA 136 136 136 140 136  K 3.5 3.2* 3.7 2.9* 3.5  CL 98 98 98 98 95*  CO2 25 28  --  30 28  GLUCOSE 196* 176* 179* 211* 223*  BUN 24* '10 11 11 '$ 26*  CREATININE 3.13* 2.58* 2.50* 3.19* 4.62*  CALCIUM 8.3* 7.9*  --  7.9* 7.9*  PHOS  --   --   --   --  5.3*    Recent Results (from the past 240 hour(s))  Culture, blood (routine x 2)     Status: None (Preliminary result)   Collection Time: 04/18/18  5:25 AM  Result Value Ref Range Status   Specimen Description BLOOD RIGHT ANTECUBITAL   Final   Special Requests   Final    BOTTLES DRAWN AEROBIC ONLY Blood Culture results may not be optimal due to an inadequate volume of blood received in culture bottles   Culture   Final    NO GROWTH 1 DAY Performed at [facility], [address], [place], [place] [address]    Report Status PENDING  Incomplete  Culture, blood (routine x 2)     Status: None (Preliminary result)   Collection Time: 04/18/18  5:30 AM  Result Value Ref Range Status   Specimen Description BLOOD RIGHT HAND  Final   Special Requests   Final    BOTTLES DRAWN AEROBIC ONLY Blood Culture results may not be optimal due to an inadequate volume of blood received in culture bottles   Culture   Final    NO GROWTH 1 DAY Performed at [facility], [place] [address] [place], [place] [address]    Report Status PENDING  Incomplete  Culture, Urine     Status: Abnormal   Collection Time: 04/18/18  8:42 AM  Result Value Ref Range Status   Specimen Description URINE, CLEAN CATCH  Final   Special Requests   Final    NONE Performed at [facility], [place] [address], [place], [place] [address]    Culture MULTIPLE SPECIES PRESENT, SUGGEST RECOLLECTION (A)  Final   Report Status 04/19/2018 FINAL  Final     Liver Function Tests: Recent Labs  Lab 04/13/18 0351 04/17/18 2147 04/18/18 0529 04/19/18 0830  AST 23 51* 90*  --   ALT 24 26 41  --   ALKPHOS 64 73 231*  --   BILITOT 0.6 0.6 1.4*  --   PROT 4.8* 4.9* 4.8*  --   ALBUMIN 2.5* 2.5* 2.3* 2.2*   Recent Labs  Lab 04/17/18 2147  LIPASE 37   No results for input(s): AMMONIA in the last 168 hours.  Cardiac Enzymes: Recent Labs  Lab 04/18/18 0529 04/18/18 1116 04/18/18 1653  TROPONINI 0.07* 0.07* 0.08*   BNP (last 3 results) Recent Labs    02/12/18 1534  BNP 225.7*    ProBNP (last 3 results) No results for input(s): PROBNP in the last 8760 hours.    Studies: Dg Chest 2 View  Result Date: 04/17/2018 CLINICAL DATA:  Right-sided rib  cage pain x1 day. EXAM: CHEST - 2 VIEW COMPARISON:  04/12/2018 FINDINGS: Cardiomegaly is noted. No alveolar consolidation is seen. Improved aeration of the lungs is identified with interval decrease in alveolar airspace opacities bilaterally. No new pulmonary consolidation nor overt pulmonary edema  is visualized. No effusion or pneumothorax. Nonaneurysmal atherosclerotic aorta is noted. Right IJ dialysis catheter tip terminates at the cavoatrial junction, stable in appearance. Cholecystectomy clips are present in the right upper quadrant. 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 IMPRESSION: Stable cardiomegaly with aortic atherosclerosis. Interval clearing of pulmonary edema. No new alveolar consolidations are noted. Electronically Signed   By: Ashley Royalty M.D.   On: 04/17/2018 22:43   Ct Renal Stone Study  Result Date: 04/17/2018 CLINICAL DATA:  62 y/o  F; flank pain. EXAM: CT ABDOMEN AND PELVIS WITHOUT CONTRAST TECHNIQUE: Multidetector CT imaging of the abdomen and pelvis was performed following the standard protocol without IV contrast. COMPARISON:  03/19/2018 CT abdomen and pelvis. FINDINGS: Lower chest: Near complete resolution of ground-glass opacities in the lung bases. Hepatobiliary: No focal liver abnormality is seen. Status post cholecystectomy. No biliary dilatation. Pancreas: Unremarkable. No pancreatic ductal dilatation or surrounding inflammatory changes. Spleen: Normal in size without focal abnormality. Adrenals/Urinary Tract: Stable left perinephric fat stranding. Normal adrenal glands. No hydronephrosis or urinary stone disease. Normal bladder. Stomach/Bowel: Stomach is within normal limits. Appendix appears normal. No evidence of bowel wall thickening, distention, or inflammatory changes. Vascular/Lymphatic: Aortic atherosclerosis. No enlarged abdominal or pelvic lymph nodes. 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 Other: No abdominal wall hernia or abnormality. No  abdominopelvic ascites. Musculoskeletal: No fracture is seen. Lumbar spondylosis with L4-5 grade 1 anterolisthesis and prominent lower lumbar facet arthrosis. IMPRESSION: 1. No acute process identified. No hydronephrosis or urinary stone disease. 2. Near complete resolution of ground-glass opacities at the lung bases. 3. Stable mild left perinephric fat stranding. 4. 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 Electronically Signed   By: Kristine Garbe M.D.   On: 04/17/2018 23:10    Scheduled Meds: . calcitRIOL  0.25 mcg Oral QODAY  . Chlorhexidine Gluconate Cloth  6 each Topical Q0600  . cyclobenzaprine  10 mg Oral TID  . feeding supplement (NEPRO CARB STEADY)  237 mL Oral BID BM  . insulin aspart  0-9 Units Subcutaneous TID WC  . insulin glargine  5 Units Subcutaneous QHS  . multivitamin  1 tablet Oral QHS      Time spent: 25 min  [facility] Pager [phone]. If 7PM-7AM, please contact night-coverage at [email or web address], Office  [phone]  password [email or web address]  04/19/2018, 2:33 PM  LOS: 0 days

## 2018-04-19 NOTE — Procedures (Addendum)
Pt seen on HD.  Still having R flank pain.  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  She was taken off of immunosuppression for this the last admission due to lack of sig response.  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  She is off prednisone completely now.  Have d/w PMD, will do full consult if pt changed to IP status.     Outpatient HD:  TTS   4h   60kg   2/2.25 bath  TDC/ maturing LUA BC AVF (03/06/18)    Hep 2000  - venofer 100 every HD x 10  - mircera 225 ug every 2 wks   - vit d 0.25 ug mwf  I was present at this dialysis session, have reviewed the session itself and made  appropriate changes Kelly Splinter MD [place] pager [phone]   04/19/2018, 11:45 AM

## 2018-04-20 DIAGNOSIS — E1122 Type 2 diabetes mellitus with diabetic chronic kidney disease: Secondary | ICD-10-CM | POA: Diagnosis present

## 2018-04-20 DIAGNOSIS — D696 Thrombocytopenia, unspecified: Secondary | ICD-10-CM | POA: Diagnosis present

## 2018-04-20 DIAGNOSIS — R1030 Lower abdominal pain, unspecified: Secondary | ICD-10-CM | POA: Diagnosis not present

## 2018-04-20 DIAGNOSIS — E1121 Type 2 diabetes mellitus with diabetic nephropathy: Secondary | ICD-10-CM | POA: Diagnosis not present

## 2018-04-20 DIAGNOSIS — B1920 Unspecified viral hepatitis C without hepatic coma: Secondary | ICD-10-CM | POA: Diagnosis present

## 2018-04-20 DIAGNOSIS — R319 Hematuria, unspecified: Secondary | ICD-10-CM | POA: Diagnosis present

## 2018-04-20 DIAGNOSIS — Z794 Long term (current) use of insulin: Secondary | ICD-10-CM | POA: Diagnosis not present

## 2018-04-20 DIAGNOSIS — F17200 Nicotine dependence, unspecified, uncomplicated: Secondary | ICD-10-CM | POA: Diagnosis present

## 2018-04-20 DIAGNOSIS — Y95 Nosocomial condition: Secondary | ICD-10-CM | POA: Diagnosis present

## 2018-04-20 DIAGNOSIS — J8489 Other specified interstitial pulmonary diseases: Secondary | ICD-10-CM | POA: Diagnosis present

## 2018-04-20 DIAGNOSIS — G4710 Hypersomnia, unspecified: Secondary | ICD-10-CM | POA: Diagnosis not present

## 2018-04-20 DIAGNOSIS — T402X5A Adverse effect of other opioids, initial encounter: Secondary | ICD-10-CM | POA: Diagnosis not present

## 2018-04-20 DIAGNOSIS — R1011 Right upper quadrant pain: Secondary | ICD-10-CM | POA: Diagnosis present

## 2018-04-20 DIAGNOSIS — I120 Hypertensive chronic kidney disease with stage 5 chronic kidney disease or end stage renal disease: Secondary | ICD-10-CM | POA: Diagnosis present

## 2018-04-20 DIAGNOSIS — J181 Lobar pneumonia, unspecified organism: Secondary | ICD-10-CM | POA: Diagnosis present

## 2018-04-20 DIAGNOSIS — D649 Anemia, unspecified: Secondary | ICD-10-CM | POA: Diagnosis present

## 2018-04-20 DIAGNOSIS — R109 Unspecified abdominal pain: Secondary | ICD-10-CM | POA: Diagnosis present

## 2018-04-20 DIAGNOSIS — B182 Chronic viral hepatitis C: Secondary | ICD-10-CM | POA: Diagnosis present

## 2018-04-20 DIAGNOSIS — R300 Dysuria: Secondary | ICD-10-CM | POA: Diagnosis present

## 2018-04-20 DIAGNOSIS — Z9981 Dependence on supplemental oxygen: Secondary | ICD-10-CM | POA: Diagnosis not present

## 2018-04-20 DIAGNOSIS — J189 Pneumonia, unspecified organism: Secondary | ICD-10-CM | POA: Diagnosis not present

## 2018-04-20 DIAGNOSIS — Z992 Dependence on renal dialysis: Secondary | ICD-10-CM | POA: Diagnosis not present

## 2018-04-20 DIAGNOSIS — Z79899 Other long term (current) drug therapy: Secondary | ICD-10-CM | POA: Diagnosis not present

## 2018-04-20 DIAGNOSIS — N186 End stage renal disease: Secondary | ICD-10-CM | POA: Diagnosis present

## 2018-04-20 DIAGNOSIS — I776 Arteritis, unspecified: Secondary | ICD-10-CM | POA: Diagnosis present

## 2018-04-20 DIAGNOSIS — D631 Anemia in chronic kidney disease: Secondary | ICD-10-CM | POA: Diagnosis present

## 2018-04-20 LAB — GLUCOSE, CAPILLARY
GLUCOSE-CAPILLARY: 131 mg/dL — AB (ref 70–99)
Glucose-Capillary: 112 mg/dL — ABNORMAL HIGH (ref 70–99)
Glucose-Capillary: 134 mg/dL — ABNORMAL HIGH (ref 70–99)
Glucose-Capillary: 203 mg/dL — ABNORMAL HIGH (ref 70–99)

## 2018-04-20 LAB — COMPREHENSIVE METABOLIC PANEL
ALT: 25 U/L (ref 0–44)
AST: 25 U/L (ref 15–41)
Albumin: 2 g/dL — ABNORMAL LOW (ref 3.5–5.0)
Alkaline Phosphatase: 161 U/L — ABNORMAL HIGH (ref 38–126)
Anion gap: 14 (ref 5–15)
BUN: 18 mg/dL (ref 8–23)
CHLORIDE: 96 mmol/L — AB (ref 98–111)
CO2: 23 mmol/L (ref 22–32)
CREATININE: 3.14 mg/dL — AB (ref 0.44–1.00)
Calcium: 7.7 mg/dL — ABNORMAL LOW (ref 8.9–10.3)
GFR calc non Af Amer: 15 mL/min — ABNORMAL LOW (ref >60)
GFR, EST AFRICAN AMERICAN: 17 mL/min — AB (ref >60)
Glucose, Bld: 111 mg/dL — ABNORMAL HIGH (ref 70–99)
POTASSIUM: 3.8 mmol/L (ref 3.5–5.1)
SODIUM: 133 mmol/L — AB (ref 135–145)
Total Bilirubin: 0.9 mg/dL (ref 0.3–1.2)
Total Protein: 4.9 g/dL — ABNORMAL LOW (ref 6.5–8.1)

## 2018-04-20 LAB — CBC
HCT: 31.4 % — ABNORMAL LOW (ref 36.0–46.0)
Hemoglobin: 9.9 g/dL — ABNORMAL LOW (ref 12.0–15.0)
MCH: 26.3 pg (ref 26.0–34.0)
MCHC: 31.5 g/dL (ref 30.0–36.0)
MCV: 83.5 fL (ref 78.0–100.0)
PLATELETS: 66 10*3/uL — AB (ref 150–400)
RBC: 3.76 MIL/uL — AB (ref 3.87–5.11)
RDW: 17.7 % — ABNORMAL HIGH (ref 11.5–15.5)
WBC: 10.5 10*3/uL (ref 4.0–10.5)

## 2018-04-20 LAB — CULTURE, BLOOD (ROUTINE X 2)

## 2018-04-20 MED ORDER — VANCOMYCIN HCL 10 G IV SOLR
1250.0000 mg | Freq: Once | INTRAVENOUS | Status: AC
  Administered 2018-04-20: 1250 mg via INTRAVENOUS
  Filled 2018-04-20: qty 1250

## 2018-04-20 MED ORDER — FAMOTIDINE 20 MG PO TABS
20.0000 mg | ORAL_TABLET | Freq: Two times a day (BID) | ORAL | Status: DC
Start: 2018-04-20 — End: 2018-04-21
  Administered 2018-04-20 – 2018-04-21 (×3): 20 mg via ORAL
  Filled 2018-04-20 (×3): qty 1

## 2018-04-20 MED ORDER — WHITE PETROLATUM EX OINT
TOPICAL_OINTMENT | CUTANEOUS | Status: AC
  Administered 2018-04-20: 0.2
  Filled 2018-04-20: qty 28.35

## 2018-04-20 MED ORDER — PIPERACILLIN-TAZOBACTAM 3.375 G IVPB
3.3750 g | Freq: Two times a day (BID) | INTRAVENOUS | Status: DC
  Administered 2018-04-20 – 2018-04-25 (×11): 3.375 g via INTRAVENOUS
  Filled 2018-04-20 (×11): qty 50

## 2018-04-20 MED ORDER — HYDROMORPHONE HCL 1 MG/ML IJ SOLN
1.0000 mg | INTRAMUSCULAR | Status: DC | PRN
  Administered 2018-04-20 – 2018-04-21 (×3): 1 mg via INTRAVENOUS
  Filled 2018-04-20 (×3): qty 1

## 2018-04-20 MED ORDER — VANCOMYCIN HCL IN DEXTROSE 750-5 MG/150ML-% IV SOLN
750.0000 mg | INTRAVENOUS | Status: DC
  Filled 2018-04-20: qty 150

## 2018-04-20 NOTE — Progress Notes (Signed)
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 She was started on HD, cytoxan and prednisone and dc'd 03/15/18 on hemodialysis.  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  Last admit her Cytoxan was stopped due to lack of response, prednisone continued for lung condition. Now patient admitted for R flank pain.  CXR and CT showing complete resolution of lung infiltrates, pt said he was taken off prednisone completely recently.  Admit CXR/ CT of chest were negative, but repeat CT abd yest is showing new RLL PNA.  Is on IV abx per primary.  No SOB or edema.  Asked to see for ESRD.     Subjective: still R flank pain w/ coughing and moving. No n/v/d, no SOB.    Vitals:   04/19/18 2130 04/20/18 0500 04/20/18 0552 04/20/18 1307  BP:   (!) 156/90 (!) 136/93  Pulse: (!) 104  (!) 111 (!) 113  Resp:   18   Temp:   99.4 F (37.4 C) 99.4 F (37.4 C)  TempSrc:   Oral Oral  SpO2: 97%  100% 100%  Weight:  61.9 kg    Height:        Inpatient medications: . calcitRIOL  0.25 mcg Oral QODAY  . Chlorhexidine Gluconate Cloth  6 each Topical Q0600  . cyclobenzaprine  10 mg Oral TID  . famotidine  20 mg Oral BID  . feeding supplement (NEPRO CARB STEADY)  237 mL Oral BID BM  . insulin aspart  0-9 Units Subcutaneous TID WC  . insulin glargine  5 Units Subcutaneous QHS  . mouth rinse  15 mL Mouth Rinse BID  . multivitamin  1 tablet Oral QHS   . piperacillin-tazobactam (ZOSYN)  IV 3.375 g (04/20/18 0950)  . [START ON 04/22/2018] vancomycin     acetaminophen **OR** acetaminophen, HYDROmorphone (DILAUDID) injection, ondansetron **OR** ondansetron (ZOFRAN) IV, zolpidem  Iron/TIBC/Ferritin/ %Sat    Component Value Date/Time   IRON 114 03/22/2018 0750   TIBC 225 (L) 03/22/2018 0750   FERRITIN 958 (H) 03/22/2018 0750   IRONPCTSAT 51 (H) 03/22/2018 0750    Exam: Nasal O2, alert, no distress  no jvd  Chest some rales R base, o/w clear  Cor reg no mrg  Abd soft ntnd no mrg  Ext no LE edema  RIJ TDC / maturing LUA AVF  NF, Ox 3   home meds:  - insulin glargine 5u hs/ novolog SSI  - prn's ultram/ zoran/ ambien/ benzonatate  - MVI/ vit d  Dialysis:  TTS   4h   60kg   2/2.25 bath  TDC/ maturing LUA BC AVF (03/06/18)    Hep 2000  - venofer 100 every HD x 10  - mircera 225 ug every 2 wks   - vit d 0.25 ug mwf      Impression: 1  ESRD - cont on HD TTS. 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  2  R flank pain / R PNA - new RLL infiltrates on f/u CT from yest. 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 CXR/ CT here this time are completely resolved, off of prednisone now.  She has been needing home oxygen, not sure  if this can be dc'd, worth looking into while she is here.  5  Hx drug abuse - per patient she had quit back in May 6  DM - taking low dose insulin at home 7  HTN - not on any bp meds at home. May need BP medication.  8  Thrombocytopenia - not new 9  Hep C+   Plan - will follow. Plan HD on Tuesday.     Kelly Splinter MD [facility] pager [phone]   04/20/2018, 2:27 PM   Recent Labs  Lab 04/19/18 0830 04/20/18 0547  NA 136 133*  K 3.5 3.8  CL 95* 96*  CO2 28 23  GLUCOSE 223* 111*  BUN 26* 18  CREATININE 4.62* 3.14*  CALCIUM 7.9* 7.7*  PHOS 5.3*  --   ALBUMIN 2.2* 2.0*   Recent Labs  Lab 04/18/18 0529 04/20/18 0547  AST 90* 25  ALT 41 25  ALKPHOS 231* 161*  BILITOT 1.4* 0.9  PROT 4.8* 4.9*   Recent Labs  Lab 04/17/18 2147  04/18/18 0529 04/19/18 0830 04/20/18 0547  WBC 5.4  --  5.2 7.9 10.5  NEUTROABS 3.9  --  3.6  --   --   HGB 10.0*   < > 9.6* 9.7* 9.9*  HCT 32.8*   < > 30.9* 31.7* 31.4*  MCV 86.3  --  84.9  85.0 83.5  PLT 66*  --  63* 64* 66*   < > = values in this interval not displayed.

## 2018-04-20 NOTE — Progress Notes (Signed)
[facility]  PROGRESS NOTE  Rhonda Lynch WUJ:[id number] DOB: 1956/03/20 DOA: 04/17/2018 PCP: Clent Demark, PA-C   Brief HPI:   62 year old female with a history of ANCA related vasculitis, on hemodialysis came to hospital with right flank pain.  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  Patient denies any symptoms of cough or shortness of breath.  Chest x-ray is clear.  CT renal stone study showed no acute abnormality. She was started on Flexeril, Percocet as needed for pain   Subjective   Patient seen and examined, CT of the abdomen pelvis shows right lower lobe infiltrate   Assessment/Plan:     1. Healthcare associated pneumonia-CT abdomen pelvis shows right lower lobe infiltrate, patient started on vancomycin and Zosyn. 1/2 bottle blood culture growing micrococcus species. 2. ESRD-on hemodialysis, Tuesday Thursday and Saturday.  Nephrology has been consulted.  Patient getting dialysis today. 3. 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  Prednisone has been tapered off as per patient. 4. Anemia, thrombocytopenia-chronic, platelet count of 64 5. Diabetes mellitus, type II-continue Lantus, sliding scale insulin with NovoLog.    DVT prophylaxis: SCDs  Code Status: Full code  Family Communication: No family at bedside  Disposition Plan: likely home when medically ready for discharge   Consultants:  Nephrology  Procedures:  None   Antibiotics:   Anti-infectives (From admission, onward)   Start     Dose/Rate Route Frequency Ordered Stop   04/22/18 1200  vancomycin (VANCOCIN) IVPB 750 mg/150 ml premix     750 mg 150 mL/hr over 60 Minutes Intravenous Every T-Th-Sa (Hemodialysis) 04/20/18 0827     04/20/18 0900  piperacillin-tazobactam (ZOSYN) IVPB 3.375 g     3.375 g 12.5 mL/hr over 240 Minutes Intravenous Every 12 hours 04/20/18 0827     04/20/18 0830  vancomycin (VANCOCIN) 1,250 mg  in sodium chloride 0.9 % 250 mL IVPB     1,250 mg 166.7 mL/hr over 90 Minutes Intravenous  Once 04/20/18 0827 04/20/18 1119   04/19/18 1500  metroNIDAZOLE (FLAGYL) IVPB 500 mg  Status:  Discontinued     500 mg 100 mL/hr over 60 Minutes Intravenous Every 8 hours 04/19/18 1441 04/20/18 0814   04/18/18 0600  cefTRIAXone (ROCEPHIN) 1 g in sodium chloride 0.9 % 100 mL IVPB  Status:  Discontinued     1 g 200 mL/hr over 30 Minutes Intravenous Every 24 hours 04/18/18 0458 04/20/18 0814       Objective   Vitals:   04/19/18 2130 04/20/18 0500 04/20/18 0552 04/20/18 1307  BP:   (!) 156/90 (!) 136/93  Pulse: (!) 104  (!) 111 (!) 113  Resp:   18   Temp:   99.4 F (37.4 C) 99.4 F (37.4 C)  TempSrc:   Oral Oral  SpO2: 97%  100% 100%  Weight:  61.9 kg    Height:        Intake/Output Summary (Last 24 hours) at 04/20/2018 1643 Last data filed at 04/20/2018 0400 Gross per 24 hour  Intake 400 ml  Output -  Net 400 ml   Filed Weights   04/19/18 0745 04/19/18 1155 04/20/18 0500  Weight: 61.8 kg 61.2 kg 61.9 kg     Physical Examination:     Neck: Supple, no deformities, masses, or tenderness Lungs: Normal respiratory effort, bilateral clear to auscultation, no crackles or wheezes.  Heart: Regular rate and rhythm, S1 and S2 normal, no murmurs, rubs auscultated  Abdomen: BS normoactive,soft,nondistended,non-tender to palpation,no organomegaly Extremities: No pretibial edema, no erythema, no cyanosis, no clubbing Neuro : Alert and oriented to time, place and person, No focal deficits       Data Reviewed: I have personally reviewed following labs and imaging studies  CBG: Recent Labs  Lab 04/19/18 1317 04/19/18 1644 04/19/18 2207 04/20/18 0758 04/20/18 1127  GLUCAP 122* 127* 126* 112* 131*    CBC: Recent Labs  Lab 04/17/18 2147 04/17/18 2155 04/18/18 0529 04/19/18 0830 04/20/18 0547  WBC 5.4  --  5.2 7.9 10.5  NEUTROABS 3.9  --  3.6  --   --   HGB 10.0* 11.2* 9.6*  9.7* 9.9*  HCT 32.8* 33.0* 30.9* 31.7* 31.4*  MCV 86.3  --  84.9 85.0 83.5  PLT 66*  --  63* 64* 66*    Basic Metabolic Panel: Recent Labs  Lab 04/17/18 2147 04/17/18 2155 04/18/18 0529 04/19/18 0830 04/20/18 0547  NA 136 136 140 136 133*  K 3.2* 3.7 2.9* 3.5 3.8  CL 98 98 98 95* 96*  CO2 28  --  30 28 23   GLUCOSE 176* 179* 211* 223* 111*  BUN 10 11 11  26* 18  CREATININE 2.58* 2.50* 3.19* 4.62* 3.14*  CALCIUM 7.9*  --  7.9* 7.9* 7.7*  PHOS  --   --   --  5.3*  --     Recent Results (from the past 240 hour(s))  Culture, blood (routine x 2)     Status: None (Preliminary result)   Collection Time: 04/18/18  5:25 AM  Result Value Ref Range Status   Specimen Description BLOOD RIGHT ANTECUBITAL  Final   Special Requests   Final    BOTTLES DRAWN AEROBIC ONLY Blood Culture results may not be optimal due to an inadequate volume of blood received in culture bottles   Culture   Final    NO GROWTH 2 DAYS Performed at [facility], [address] [place], [place] [address]    Report Status PENDING  Incomplete  Culture, blood (routine x 2)     Status: Abnormal   Collection Time: 04/18/18  5:30 AM  Result Value Ref Range Status   Specimen Description BLOOD RIGHT HAND  Final   Special Requests   Final    BOTTLES DRAWN AEROBIC ONLY Blood Culture results may not be optimal due to an inadequate volume of blood received in culture bottles   Culture  Setup Time   Final    GRAM POSITIVE COCCI IN CLUSTERS AEROBIC BOTTLE ONLY CRITICAL RESULT CALLED TO, READ BACK BY AND VERIFIED WITH: M. PHAM, RPHARMD AT 1735 ON 04/19/18 BY C. JESSUP, MLT.    Culture (A)  Final    MICROCOCCUS SPECIES Standardized susceptibility testing for this organism is not available. Performed at [facility], [place] [address], [place], [place] [address]    Report Status 04/20/2018 FINAL  Final  Blood Culture ID Panel (Reflexed)     Status: None   Collection Time: 04/18/18  5:30 AM  Result Value Ref Range  Status   Enterococcus species NOT DETECTED NOT DETECTED Final   Listeria monocytogenes NOT DETECTED NOT DETECTED Final   Staphylococcus species NOT DETECTED NOT DETECTED Final   Staphylococcus aureus NOT DETECTED NOT DETECTED Final   Streptococcus species NOT DETECTED NOT DETECTED Final   Streptococcus agalactiae NOT DETECTED NOT DETECTED Final   Streptococcus pneumoniae NOT DETECTED NOT DETECTED Final   Streptococcus pyogenes NOT DETECTED NOT DETECTED Final  Acinetobacter baumannii NOT DETECTED NOT DETECTED Final   Enterobacteriaceae species NOT DETECTED NOT DETECTED Final   Enterobacter cloacae complex NOT DETECTED NOT DETECTED Final   Escherichia coli NOT DETECTED NOT DETECTED Final   Klebsiella oxytoca NOT DETECTED NOT DETECTED Final   Klebsiella pneumoniae NOT DETECTED NOT DETECTED Final   Proteus species NOT DETECTED NOT DETECTED Final   Serratia marcescens NOT DETECTED NOT DETECTED Final   Haemophilus influenzae NOT DETECTED NOT DETECTED Final   Neisseria meningitidis NOT DETECTED NOT DETECTED Final   Pseudomonas aeruginosa NOT DETECTED NOT DETECTED Final   Candida albicans NOT DETECTED NOT DETECTED Final   Candida glabrata NOT DETECTED NOT DETECTED Final   Candida krusei NOT DETECTED NOT DETECTED Final   Candida parapsilosis NOT DETECTED NOT DETECTED Final   Candida tropicalis NOT DETECTED NOT DETECTED Final    Comment: Performed at [facility], [address] [place], [place] [address]  Culture, Urine     Status: Abnormal   Collection Time: 04/18/18  8:42 AM  Result Value Ref Range Status   Specimen Description URINE, CLEAN CATCH  Final   Special Requests   Final    NONE Performed at [facility], [place] [address], [place], [place] [address]    Culture MULTIPLE SPECIES PRESENT, SUGGEST RECOLLECTION (A)  Final   Report Status 04/19/2018 FINAL  Final     Liver Function Tests: Recent Labs  Lab 04/17/18 2147 04/18/18 0529 04/19/18 0830 04/20/18 0547  AST  51* 90*  --  25  ALT 26 41  --  25  ALKPHOS 73 231*  --  161*  BILITOT 0.6 1.4*  --  0.9  PROT 4.9* 4.8*  --  4.9*  ALBUMIN 2.5* 2.3* 2.2* 2.0*   Recent Labs  Lab 04/17/18 2147  LIPASE 37   No results for input(s): AMMONIA in the last 168 hours.  Cardiac Enzymes: Recent Labs  Lab 04/18/18 0529 04/18/18 1116 04/18/18 1653  TROPONINI 0.07* 0.07* 0.08*   BNP (last 3 results) Recent Labs    02/12/18 1534  BNP 225.7*    ProBNP (last 3 results) No results for input(s): PROBNP in the last 8760 hours.    Studies: Ct Abdomen Pelvis W Contrast  Result Date: 04/19/2018 CLINICAL DATA:  Acute onset of right upper quadrant abdominal pain. Elevated bilirubin and alkaline phosphatase. EXAM: CT ABDOMEN AND PELVIS WITH CONTRAST TECHNIQUE: Multidetector CT imaging of the abdomen and pelvis was performed using the standard protocol following bolus administration of intravenous contrast. CONTRAST:  124mL OMNIPAQUE IOHEXOL 300 MG/ML  SOLN COMPARISON:  CT of the abdomen and pelvis from 04/17/2018 FINDINGS: Lower chest: There is new consolidation of the right lower lobe, compatible with pneumonia. Mild left basilar atelectasis is noted. The visualized portions of the mediastinum are unremarkable. Hepatobiliary: Mild prominence of the intrahepatic biliary ducts appears relatively stable and likely reflects prior cholecystectomy. The common bile duct is normal in caliber status post cholecystectomy. Clips are noted at the gallbladder fossa. The liver is otherwise unremarkable in appearance. Pancreas: The pancreas is within normal limits. Spleen: A nonspecific 1.4 cm hypodensity is noted within the spleen; a smaller better defined 8 mm cyst is also seen. Adrenals/Urinary Tract: The adrenal glands are unremarkable in appearance. The kidneys are within normal limits. There is no evidence of hydronephrosis. No renal or ureteral stones are identified. Mild nonspecific left-sided perinephric stranding is seen.  Stomach/Bowel: The stomach is unremarkable in appearance. The small bowel is within normal limits.  The appendix is normal in caliber, without evidence of appendicitis. The colon is unremarkable in appearance. Vascular/Lymphatic: Scattered calcification is seen along the abdominal aorta and its branches. The abdominal aorta is otherwise grossly unremarkable. The inferior vena cava is grossly unremarkable. No retroperitoneal lymphadenopathy is seen. No pelvic sidewall lymphadenopathy is identified. Reproductive: The bladder is mildly distended and grossly unremarkable. The patient is status post hysterectomy. No suspicious adnexal masses are seen. Other: No additional soft tissue abnormalities are seen. Musculoskeletal: No acute osseous abnormalities are identified. The visualized musculature is unremarkable in appearance. IMPRESSION: 1. New consolidation of the right lower lung lobe, compatible with pneumonia. 2. Status post cholecystectomy. Biliary tree is grossly unremarkable in appearance. No focal abnormalities identified at the right upper quadrant at this time. 3. Nonspecific 1.4 cm hypodensity within the spleen; smaller better defined 8 mm cyst also seen. Aortic Atherosclerosis (ICD10-I70.0). Electronically Signed   By: Garald Balding M.D.   On: 04/19/2018 23:40    Scheduled Meds: . calcitRIOL  0.25 mcg Oral QODAY  . Chlorhexidine Gluconate Cloth  6 each Topical Q0600  . cyclobenzaprine  10 mg Oral TID  . famotidine  20 mg Oral BID  . feeding supplement (NEPRO CARB STEADY)  237 mL Oral BID BM  . insulin aspart  0-9 Units Subcutaneous TID WC  . insulin glargine  5 Units Subcutaneous QHS  . mouth rinse  15 mL Mouth Rinse BID  . multivitamin  1 tablet Oral QHS      Time spent: 25 min  Oswald Hillock   [facility] Pager [phone]. If 7PM-7AM, please contact night-coverage at [email or web address], Office  [phone]  password [email or web address]  04/20/2018, 4:43 PM  LOS: 0 days

## 2018-04-20 NOTE — Progress Notes (Signed)
Pharmacy Antibiotic Note  Rhonda Lynch is a 62 y.o. female admitted on 04/17/2018 with right flank pain.  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  Pharmacy has been consulted for vancomycin and Zosyn dosing for PNA.  She has ESRD on TTS HD.  Afebrile, WBC WNL.   Plan: Vanc 1250mg  IV x 1, then 750mg  IV qHD TTS Zosyn EID 3.375gm IV Q12H Monitor HD schedule/tolerance, clinical progress, vanc level as indicated   Height: 5\' 1"  (154.9 cm) Weight: 136 lb 7.4 oz (61.9 kg) IBW/kg (Calculated) : 47.8  Temp (24hrs), Avg:98.9 F (37.2 C), Min:98.2 F (36.8 C), Max:99.5 F (37.5 C)  Recent Labs  Lab 04/17/18 2147 04/17/18 2155 04/18/18 0529 04/19/18 0830 04/20/18 0547  WBC 5.4  --  5.2 7.9 10.5  CREATININE 2.58* 2.50* 3.19* 4.62* 3.14*    Estimated Creatinine Clearance: 15.3 mL/min (A) (by C-G formula based on SCr of 3.14 mg/dL (H)).    No Known Allergies   Vanc 8/11 >> Zosyn 8/11 >> CTX 8/9 >> 8/11 Flagyl 8/10 >> 8/11  8/9 BCx - GPC (BCID negative) - contaminant 8/9 UCx - mult sp   Shaine Newmark D. Mina Marble, PharmD, BCPS, [place] 04/20/2018, 8:29 AM

## 2018-04-21 LAB — GLUCOSE, CAPILLARY
GLUCOSE-CAPILLARY: 117 mg/dL — AB (ref 70–99)
GLUCOSE-CAPILLARY: 133 mg/dL — AB (ref 70–99)
Glucose-Capillary: 145 mg/dL — ABNORMAL HIGH (ref 70–99)
Glucose-Capillary: 137 mg/dL — ABNORMAL HIGH (ref 70–99)
Glucose-Capillary: 154 mg/dL — ABNORMAL HIGH (ref 70–99)

## 2018-04-21 LAB — BASIC METABOLIC PANEL
ANION GAP: 15 (ref 5–15)
BUN: 39 mg/dL — AB (ref 8–23)
CALCIUM: 7.8 mg/dL — AB (ref 8.9–10.3)
CO2: 23 mmol/L (ref 22–32)
Chloride: 96 mmol/L — ABNORMAL LOW (ref 98–111)
Creatinine, Ser: 4.52 mg/dL — ABNORMAL HIGH (ref 0.44–1.00)
GFR calc Af Amer: 11 mL/min — ABNORMAL LOW (ref >60)
GFR calc non Af Amer: 9 mL/min — ABNORMAL LOW (ref >60)
GLUCOSE: 148 mg/dL — AB (ref 70–99)
POTASSIUM: 4 mmol/L (ref 3.5–5.1)
Sodium: 134 mmol/L — ABNORMAL LOW (ref 135–145)

## 2018-04-21 MED ORDER — FAMOTIDINE 20 MG PO TABS
20.0000 mg | ORAL_TABLET | Freq: Every day | ORAL | Status: DC
  Administered 2018-04-22 – 2018-04-25 (×4): 20 mg via ORAL
  Filled 2018-04-21 (×4): qty 1

## 2018-04-21 MED ORDER — HYDROMORPHONE HCL 1 MG/ML IJ SOLN: 0.5000 mg | INTRAMUSCULAR | Status: DC | PRN

## 2018-04-21 MED ORDER — OXYCODONE HCL 5 MG PO TABS
5.0000 mg | ORAL_TABLET | ORAL | Status: DC | PRN
  Administered 2018-04-21 – 2018-04-25 (×6): 5 mg via ORAL
  Filled 2018-04-21 (×6): qty 1

## 2018-04-21 MED ORDER — POLYETHYLENE GLYCOL 3350 17 G PO PACK
17.0000 g | PACK | Freq: Every day | ORAL | Status: DC
  Administered 2018-04-21 – 2018-04-23 (×3): 17 g via ORAL
  Filled 2018-04-21 (×4): qty 1

## 2018-04-21 MED ORDER — CHLORHEXIDINE GLUCONATE CLOTH 2 % EX PADS
6.0000 | MEDICATED_PAD | Freq: Every day | CUTANEOUS | Status: DC
  Administered 2018-04-22 – 2018-04-25 (×4): 6 via TOPICAL

## 2018-04-21 NOTE — Progress Notes (Signed)
[facility]  PROGRESS NOTE  Rhonda Lynch ZOX:[id number] DOB: 05-11-1956 DOA: 04/17/2018 PCP: Clent Demark, PA-C   Brief HPI:   62 year old female with a history of ANCA related vasculitis, on hemodialysis came to hospital with right flank pain.  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  Patient denies any symptoms of cough or shortness of breath.  Chest x-ray is clear.  CT renal stone study showed no acute abnormality. She was started on Flexeril, Percocet as needed for pain   Subjective   Patient seen and examined, somnolent this morning after she received Dilaudid   Assessment/Plan:     1. Healthcare associated pneumonia-CT abdomen pelvis shows right lower lobe infiltrate, patient started on vancomycin and Zosyn. 1/2 bottle blood culture growing micrococcus species. 2. Hypersomnolence-likely from IV Dilaudid.  Will discontinue IV Dilaudid at this time.  And started on oxycodone 5 mg every 4 hours PRN for pain. 3. ESRD-on hemodialysis, Tuesday Thursday and Saturday.  Nephrology has been consulted.  Patient getting dialysis today. 4. 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  Prednisone has been tapered off as per patient. 5. Anemia, thrombocytopenia-chronic, platelet count of 64 6. Diabetes mellitus, type II-continue Lantus, sliding scale insulin with NovoLog.  Blood glucose is well controlled    DVT prophylaxis: SCDs  Code Status: Full code  Family Communication: No family at bedside  Disposition Plan: likely home when medically ready for discharge   Consultants:  Nephrology  Procedures:  None   Antibiotics:   Anti-infectives (From admission, onward)   Start     Dose/Rate Route Frequency Ordered Stop   04/22/18 1200  vancomycin (VANCOCIN) IVPB 750 mg/150 ml premix     750 mg 150 mL/hr over 60 Minutes Intravenous Every T-Th-Sa (Hemodialysis) 04/20/18 0827     04/20/18 0900   piperacillin-tazobactam (ZOSYN) IVPB 3.375 g     3.375 g 12.5 mL/hr over 240 Minutes Intravenous Every 12 hours 04/20/18 0827     04/20/18 0830  vancomycin (VANCOCIN) 1,250 mg in sodium chloride 0.9 % 250 mL IVPB     1,250 mg 166.7 mL/hr over 90 Minutes Intravenous  Once 04/20/18 0827 04/20/18 1119   04/19/18 1500  metroNIDAZOLE (FLAGYL) IVPB 500 mg  Status:  Discontinued     500 mg 100 mL/hr over 60 Minutes Intravenous Every 8 hours 04/19/18 1441 04/20/18 0814   04/18/18 0600  cefTRIAXone (ROCEPHIN) 1 g in sodium chloride 0.9 % 100 mL IVPB  Status:  Discontinued     1 g 200 mL/hr over 30 Minutes Intravenous Every 24 hours 04/18/18 0458 04/20/18 0814       Objective   Vitals:   04/20/18 2116 04/21/18 0148 04/21/18 0454 04/21/18 1402  BP: 126/83 (!) 144/97 100/78 105/79  Pulse: (!) 115 (!) 112 (!) 110 (!) 106  Resp: 18  18 16   Temp: 100.2 F (37.9 C)  98.4 F (36.9 C) 97.9 F (36.6 C)  TempSrc: Oral  Oral Axillary  SpO2: 100% 97% 100% 100%  Weight:      Height:        Intake/Output Summary (Last 24 hours) at 04/21/2018 1809 Last data filed at 04/21/2018 1511 Gross per 24 hour  Intake 798.05 ml  Output -  Net 798.05 ml   Filed Weights   04/19/18 0745 04/19/18 1155 04/20/18 0500  Weight: 61.8 kg 61.2 kg 61.9 kg     Physical Examination:    Neck: Supple, no  deformities, masses, or tenderness Lungs: Normal respiratory effort, bilateral clear to auscultation, no crackles or wheezes.  Heart: Regular rate and rhythm, S1 and S2 normal, no murmurs, rubs auscultated Abdomen: BS normoactive,soft,nondistended,non-tender to palpation,no organomegaly Extremities: No pretibial edema, no erythema, no cyanosis, no clubbing Neuro : Somnolent, opens eyes to sternal rub        Data Reviewed: I have personally reviewed following labs and imaging studies  CBG: Recent Labs  Lab 04/20/18 1653 04/20/18 2115 04/21/18 0801 04/21/18 1202 04/21/18 1637  GLUCAP 134* 203* 137*  117* 154*    CBC: Recent Labs  Lab 04/17/18 2147 04/17/18 2155 04/18/18 0529 04/19/18 0830 04/20/18 0547  WBC 5.4  --  5.2 7.9 10.5  NEUTROABS 3.9  --  3.6  --   --   HGB 10.0* 11.2* 9.6* 9.7* 9.9*  HCT 32.8* 33.0* 30.9* 31.7* 31.4*  MCV 86.3  --  84.9 85.0 83.5  PLT 66*  --  63* 64* 66*    Basic Metabolic Panel: Recent Labs  Lab 04/17/18 2147 04/17/18 2155 04/18/18 0529 04/19/18 0830 04/20/18 0547 04/21/18 0444  NA 136 136 140 136 133* 134*  K 3.2* 3.7 2.9* 3.5 3.8 4.0  CL 98 98 98 95* 96* 96*  CO2 28  --  30 28 23 23   GLUCOSE 176* 179* 211* 223* 111* 148*  BUN 10 11 11  26* 18 39*  CREATININE 2.58* 2.50* 3.19* 4.62* 3.14* 4.52*  CALCIUM 7.9*  --  7.9* 7.9* 7.7* 7.8*  PHOS  --   --   --  5.3*  --   --     Recent Results (from the past 240 hour(s))  Culture, blood (routine x 2)     Status: None (Preliminary result)   Collection Time: 04/18/18  5:25 AM  Result Value Ref Range Status   Specimen Description BLOOD RIGHT ANTECUBITAL  Final   Special Requests   Final    BOTTLES DRAWN AEROBIC ONLY Blood Culture results may not be optimal due to an inadequate volume of blood received in culture bottles   Culture   Final    NO GROWTH 3 DAYS Performed at [facility], [address], [place], [place] [address]    Report Status PENDING  Incomplete  Culture, blood (routine x 2)     Status: Abnormal   Collection Time: 04/18/18  5:30 AM  Result Value Ref Range Status   Specimen Description BLOOD RIGHT HAND  Final   Special Requests   Final    BOTTLES DRAWN AEROBIC ONLY Blood Culture results may not be optimal due to an inadequate volume of blood received in culture bottles   Culture  Setup Time   Final    GRAM POSITIVE COCCI IN CLUSTERS AEROBIC BOTTLE ONLY CRITICAL RESULT CALLED TO, READ BACK BY AND VERIFIED WITH: M. PHAM, RPHARMD AT 1735 ON 04/19/18 BY C. JESSUP, MLT.    Culture (A)  Final    MICROCOCCUS SPECIES Standardized susceptibility testing for this  organism is not available. Performed at [facility], [place] [address], [place],  [address]    Report Status 04/20/2018 FINAL  Final  Blood Culture ID Panel (Reflexed)     Status: None   Collection Time: 04/18/18  5:30 AM  Result Value Ref Range Status   Enterococcus species NOT DETECTED NOT DETECTED Final   Listeria monocytogenes NOT DETECTED NOT DETECTED Final   Staphylococcus species NOT DETECTED NOT DETECTED Final   Staphylococcus aureus NOT DETECTED  NOT DETECTED Final   Streptococcus species NOT DETECTED NOT DETECTED Final   Streptococcus agalactiae NOT DETECTED NOT DETECTED Final   Streptococcus pneumoniae NOT DETECTED NOT DETECTED Final   Streptococcus pyogenes NOT DETECTED NOT DETECTED Final   Acinetobacter baumannii NOT DETECTED NOT DETECTED Final   Enterobacteriaceae species NOT DETECTED NOT DETECTED Final   Enterobacter cloacae complex NOT DETECTED NOT DETECTED Final   Escherichia coli NOT DETECTED NOT DETECTED Final   Klebsiella oxytoca NOT DETECTED NOT DETECTED Final   Klebsiella pneumoniae NOT DETECTED NOT DETECTED Final   Proteus species NOT DETECTED NOT DETECTED Final   Serratia marcescens NOT DETECTED NOT DETECTED Final   Haemophilus influenzae NOT DETECTED NOT DETECTED Final   Neisseria meningitidis NOT DETECTED NOT DETECTED Final   Pseudomonas aeruginosa NOT DETECTED NOT DETECTED Final   Candida albicans NOT DETECTED NOT DETECTED Final   Candida glabrata NOT DETECTED NOT DETECTED Final   Candida krusei NOT DETECTED NOT DETECTED Final   Candida parapsilosis NOT DETECTED NOT DETECTED Final   Candida tropicalis NOT DETECTED NOT DETECTED Final    Comment: Performed at [facility], [place] [address] [place], [place] [address]  Culture, Urine     Status: Abnormal   Collection Time: 04/18/18  8:42 AM  Result Value Ref Range Status   Specimen Description URINE, CLEAN CATCH  Final   Special Requests   Final    NONE Performed at [facility],  [place] [address], [place], [place] [address]    Culture MULTIPLE SPECIES PRESENT, SUGGEST RECOLLECTION (A)  Final   Report Status 04/19/2018 FINAL  Final     Liver Function Tests: Recent Labs  Lab 04/17/18 2147 04/18/18 0529 04/19/18 0830 04/20/18 0547  AST 51* 90*  --  25  ALT 26 41  --  25  ALKPHOS 73 231*  --  161*  BILITOT 0.6 1.4*  --  0.9  PROT 4.9* 4.8*  --  4.9*  ALBUMIN 2.5* 2.3* 2.2* 2.0*   Recent Labs  Lab 04/17/18 2147  LIPASE 37   No results for input(s): AMMONIA in the last 168 hours.  Cardiac Enzymes: Recent Labs  Lab 04/18/18 0529 04/18/18 1116 04/18/18 1653  TROPONINI 0.07* 0.07* 0.08*   BNP (last 3 results) Recent Labs    02/12/18 1534  BNP 225.7*    ProBNP (last 3 results) No results for input(s): PROBNP in the last 8760 hours.    Studies: Ct Abdomen Pelvis W Contrast  Result Date: 04/19/2018 CLINICAL DATA:  Acute onset of right upper quadrant abdominal pain. Elevated bilirubin and alkaline phosphatase. EXAM: CT ABDOMEN AND PELVIS WITH CONTRAST TECHNIQUE: Multidetector CT imaging of the abdomen and pelvis was performed using the standard protocol following bolus administration of intravenous contrast. CONTRAST:  143mL OMNIPAQUE IOHEXOL 300 MG/ML  SOLN COMPARISON:  CT of the abdomen and pelvis from 04/17/2018 FINDINGS: Lower chest: There is new consolidation of the right lower lobe, compatible with pneumonia. Mild left basilar atelectasis is noted. The visualized portions of the mediastinum are unremarkable. Hepatobiliary: Mild prominence of the intrahepatic biliary ducts appears relatively stable and likely reflects prior cholecystectomy. The common bile duct is normal in caliber status post cholecystectomy. Clips are noted at the gallbladder fossa. The liver is otherwise unremarkable in appearance. Pancreas: The pancreas is within normal limits. Spleen: A nonspecific 1.4 cm hypodensity is noted within the spleen; a smaller better defined 8 mm cyst is  also seen. Adrenals/Urinary Tract: The adrenal glands are unremarkable in  appearance. The kidneys are within normal limits. There is no evidence of hydronephrosis. No renal or ureteral stones are identified. Mild nonspecific left-sided perinephric stranding is seen. Stomach/Bowel: The stomach is unremarkable in appearance. The small bowel is within normal limits. The appendix is normal in caliber, without evidence of appendicitis. The colon is unremarkable in appearance. Vascular/Lymphatic: Scattered calcification is seen along the abdominal aorta and its branches. The abdominal aorta is otherwise grossly unremarkable. The inferior vena cava is grossly unremarkable. No retroperitoneal lymphadenopathy is seen. No pelvic sidewall lymphadenopathy is identified. Reproductive: The bladder is mildly distended and grossly unremarkable. The patient is status post hysterectomy. No suspicious adnexal masses are seen. Other: No additional soft tissue abnormalities are seen. Musculoskeletal: No acute osseous abnormalities are identified. The visualized musculature is unremarkable in appearance. IMPRESSION: 1. New consolidation of the right lower lung lobe, compatible with pneumonia. 2. Status post cholecystectomy. Biliary tree is grossly unremarkable in appearance. No focal abnormalities identified at the right upper quadrant at this time. 3. Nonspecific 1.4 cm hypodensity within the spleen; smaller better defined 8 mm cyst also seen. Aortic Atherosclerosis (ICD10-I70.0). Electronically Signed   By: Garald Balding M.D.   On: 04/19/2018 23:40    Scheduled Meds: . calcitRIOL  0.25 mcg Oral QODAY  . Chlorhexidine Gluconate Cloth  6 each Topical Q0600  . [START ON 04/22/2018] Chlorhexidine Gluconate Cloth  6 each Topical Q0600  . cyclobenzaprine  10 mg Oral TID  . [START ON 04/22/2018] famotidine  20 mg Oral Daily  . feeding supplement (NEPRO CARB STEADY)  237 mL Oral BID BM  . insulin aspart  0-9 Units Subcutaneous TID WC   . insulin glargine  5 Units Subcutaneous QHS  . mouth rinse  15 mL Mouth Rinse BID  . multivitamin  1 tablet Oral QHS  . polyethylene glycol  17 g Oral Daily      Time spent: 25 min  [facility] Pager [phone]. If 7PM-7AM, please contact night-coverage at [email or web address], Office  [phone]  password [email or web address]  04/21/2018, 6:09 PM  LOS: 1 day

## 2018-04-21 NOTE — Progress Notes (Signed)
MD paged regarding patient's pain medication. Patient complaining of pain but also making strange comments about presents she had in her room. Patient alert to self, place, time, and reason she is in the hospital. MD stated he would d/c Dilaudid and add Oxy 5mg  prn. Will continue to monitor.

## 2018-04-21 NOTE — Progress Notes (Signed)
Dialysis: TTS  4h 60kg 2/2.25 bath TDC/ maturing LUA BC AVF (03/06/18) Hep 2000 - venofer 100 every HD x 10 - mircera 225 ug every 2 wks  - vit d 0.25 ug mwf     Impression: 1  ESRD - cont on HD TTS. 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  3  Volume - no sig vol overload on exam, up 2kg today 2  R flank pain / R PNA - new RLL infiltrates. 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 On oxy. 5  Hx drug abuse - per patient she had quit back in May 7  HTN - not on any bp meds at home. May need BP medication.  8  Thrombocytopenia - chronic 9  Hep C+  Plan -  Plan HD on Tuesday.    Subjective: Interval History: Sedated from dilaudid given  Objective: Vital signs in last 24 hours: Temp:  [98.4 F (36.9 C)-100.2 F (37.9 C)] 98.4 F (36.9 C) (08/12 0454) Pulse Rate:  [110-115] 110 (08/12 0454) Resp:  [18] 18 (08/12 0454) BP: (100-144)/(78-97) 100/78 (08/12 0454) SpO2:  [97 %-100 %] 100 % (08/12 0454) Weight change:   Intake/Output from previous day: 08/11 0701 - 08/12 0700 In: 478.1 [P.O.:356; IV Piggyback:122.1] Out: -  Intake/Output this shift: Total I/O In: 50 [P.O.:50] Out: -   General appearance: arouses only Sleeping  Lab Results: Recent Labs    04/19/18 0830 04/20/18 0547  WBC 7.9 10.5  HGB 9.7* 9.9*  HCT 31.7* 31.4*  PLT 64* 66*   BMET:  Recent Labs    04/20/18 0547 04/21/18 0444  NA 133* 134*  K 3.8 4.0  CL 96* 96*  CO2 23 23  GLUCOSE 111* 148*  BUN 18 39*  CREATININE 3.14* 4.52*  CALCIUM 7.7* 7.8*   No results for input(s): PTH in the last 72 hours. Iron Studies: No results for input(s): IRON, TIBC, TRANSFERRIN, FERRITIN in the last 72 hours. Studies/Results: Ct Abdomen Pelvis W Contrast  Result Date: 04/19/2018 CLINICAL DATA:  Acute onset of right upper quadrant abdominal pain. Elevated bilirubin and alkaline phosphatase. EXAM: CT ABDOMEN AND  PELVIS WITH CONTRAST TECHNIQUE: Multidetector CT imaging of the abdomen and pelvis was performed using the standard protocol following bolus administration of intravenous contrast. CONTRAST:  164mL OMNIPAQUE IOHEXOL 300 MG/ML  SOLN COMPARISON:  CT of the abdomen and pelvis from 04/17/2018 FINDINGS: Lower chest: There is new consolidation of the right lower lobe, compatible with pneumonia. Mild left basilar atelectasis is noted. The visualized portions of the mediastinum are unremarkable. Hepatobiliary: Mild prominence of the intrahepatic biliary ducts appears relatively stable and likely reflects prior cholecystectomy. The common bile duct is normal in caliber status post cholecystectomy. Clips are noted at the gallbladder fossa. The liver is otherwise unremarkable in appearance. Pancreas: The pancreas is within normal limits. Spleen: A nonspecific 1.4 cm hypodensity is noted within the spleen; a smaller better defined 8 mm cyst is also seen. Adrenals/Urinary Tract: The adrenal glands are unremarkable in appearance. The kidneys are within normal limits. There is no evidence of hydronephrosis. No renal or ureteral stones are identified. Mild nonspecific left-sided perinephric stranding is seen. Stomach/Bowel: The stomach is unremarkable in appearance. The small bowel is within normal limits. The appendix is normal in caliber, without evidence of appendicitis. The colon is unremarkable in appearance. Vascular/Lymphatic: Scattered calcification is seen along the abdominal aorta and  its branches. The abdominal aorta is otherwise grossly unremarkable. The inferior vena cava is grossly unremarkable. No retroperitoneal lymphadenopathy is seen. No pelvic sidewall lymphadenopathy is identified. Reproductive: The bladder is mildly distended and grossly unremarkable. The patient is status post hysterectomy. No suspicious adnexal masses are seen. Other: No additional soft tissue abnormalities are seen. Musculoskeletal: No acute  osseous abnormalities are identified. The visualized musculature is unremarkable in appearance. IMPRESSION: 1. New consolidation of the right lower lung lobe, compatible with pneumonia. 2. Status post cholecystectomy. Biliary tree is grossly unremarkable in appearance. No focal abnormalities identified at the right upper quadrant at this time. 3. Nonspecific 1.4 cm hypodensity within the spleen; smaller better defined 8 mm cyst also seen. Aortic Atherosclerosis (ICD10-I70.0). Electronically Signed   By: Garald Balding M.D.   On: 04/19/2018 23:40    Scheduled: . calcitRIOL  0.25 mcg Oral QODAY  . Chlorhexidine Gluconate Cloth  6 each Topical Q0600  . cyclobenzaprine  10 mg Oral TID  . famotidine  20 mg Oral BID  . feeding supplement (NEPRO CARB STEADY)  237 mL Oral BID BM  . insulin aspart  0-9 Units Subcutaneous TID WC  . insulin glargine  5 Units Subcutaneous QHS  . mouth rinse  15 mL Mouth Rinse BID  . multivitamin  1 tablet Oral QHS  . polyethylene glycol  17 g Oral Daily     LOS: 1 day   Estanislado Emms 04/21/2018,1:45 PM

## 2018-04-22 ENCOUNTER — Encounter (HOSPITAL_COMMUNITY): Payer: Self-pay

## 2018-04-22 ENCOUNTER — Ambulatory Visit: Payer: Self-pay | Admitting: Nurse Practitioner

## 2018-04-22 LAB — GLUCOSE, CAPILLARY
Glucose-Capillary: 92 mg/dL (ref 70–99)
Glucose-Capillary: 129 mg/dL — ABNORMAL HIGH (ref 70–99)
Glucose-Capillary: 102 mg/dL — ABNORMAL HIGH (ref 70–99)
Glucose-Capillary: 126 mg/dL — ABNORMAL HIGH (ref 70–99)
Glucose-Capillary: 281 mg/dL — ABNORMAL HIGH (ref 70–99)
Glucose-Capillary: 337 mg/dL — ABNORMAL HIGH (ref 70–99)

## 2018-04-22 LAB — CBC
HCT: 25.5 % — ABNORMAL LOW (ref 36.0–46.0)
Hemoglobin: 8 g/dL — ABNORMAL LOW (ref 12.0–15.0)
MCH: 25.7 pg — AB (ref 26.0–34.0)
MCHC: 31.4 g/dL (ref 30.0–36.0)
MCV: 82 fL (ref 78.0–100.0)
Platelets: 102 10*3/uL — ABNORMAL LOW (ref 150–400)
RBC: 3.11 MIL/uL — ABNORMAL LOW (ref 3.87–5.11)
RDW: 17.6 % — AB (ref 11.5–15.5)
WBC: 6.1 10*3/uL (ref 4.0–10.5)

## 2018-04-22 LAB — RENAL FUNCTION PANEL
Albumin: 1.6 g/dL — ABNORMAL LOW (ref 3.5–5.0)
Anion gap: 11 (ref 5–15)
BUN: 51 mg/dL — AB (ref 8–23)
CO2: 26 mmol/L (ref 22–32)
Calcium: 7.5 mg/dL — ABNORMAL LOW (ref 8.9–10.3)
Chloride: 98 mmol/L (ref 98–111)
Creatinine, Ser: 6.1 mg/dL — ABNORMAL HIGH (ref 0.44–1.00)
GFR calc Af Amer: 8 mL/min — ABNORMAL LOW (ref >60)
GFR calc non Af Amer: 7 mL/min — ABNORMAL LOW (ref >60)
GLUCOSE: 133 mg/dL — AB (ref 70–99)
Phosphorus: 6.3 mg/dL — ABNORMAL HIGH (ref 2.5–4.6)
Potassium: 4.1 mmol/L (ref 3.5–5.1)
Sodium: 135 mmol/L (ref 135–145)

## 2018-04-22 MED ORDER — PENTAFLUOROPROP-TETRAFLUOROETH EX AERO: 1.0000 "application " | INHALATION_SPRAY | CUTANEOUS | Status: DC | PRN

## 2018-04-22 MED ORDER — LIDOCAINE HCL (PF) 1 % IJ SOLN: 5.0000 mL | INTRAMUSCULAR | Status: DC | PRN

## 2018-04-22 MED ORDER — CALCITRIOL 0.25 MCG PO CAPS
ORAL_CAPSULE | ORAL | Status: AC
  Administered 2018-04-22: 0.25 ug
  Filled 2018-04-22: qty 1

## 2018-04-22 MED ORDER — LIDOCAINE-PRILOCAINE 2.5-2.5 % EX CREA: 1.0000 "application " | TOPICAL_CREAM | CUTANEOUS | Status: DC | PRN

## 2018-04-22 MED ORDER — VANCOMYCIN HCL IN DEXTROSE 750-5 MG/150ML-% IV SOLN
INTRAVENOUS | Status: AC
  Administered 2018-04-22: 750 mg
  Filled 2018-04-22: qty 150

## 2018-04-22 MED ORDER — HEPARIN SODIUM (PORCINE) 1000 UNIT/ML DIALYSIS: 1000.0000 [IU] | INTRAMUSCULAR | Status: DC | PRN

## 2018-04-22 MED ORDER — LEVALBUTEROL HCL 1.25 MG/0.5ML IN NEBU: 1.2500 mg | INHALATION_SOLUTION | Freq: Four times a day (QID) | RESPIRATORY_TRACT | Status: DC | PRN

## 2018-04-22 MED ORDER — INSULIN ASPART 100 UNIT/ML ~~LOC~~ SOLN
5.0000 [IU] | Freq: Once | SUBCUTANEOUS | Status: AC
  Administered 2018-04-22: 5 [IU] via SUBCUTANEOUS

## 2018-04-22 MED ORDER — HEPARIN SODIUM (PORCINE) 1000 UNIT/ML DIALYSIS: 20.0000 [IU]/kg | INTRAMUSCULAR | Status: DC | PRN

## 2018-04-22 MED ORDER — SODIUM CHLORIDE 0.9 % IV SOLN: 100.0000 mL | INTRAVENOUS | Status: DC | PRN

## 2018-04-22 MED ORDER — ALTEPLASE 2 MG IJ SOLR: 2.0000 mg | Freq: Once | INTRAMUSCULAR | Status: DC | PRN

## 2018-04-22 NOTE — Progress Notes (Signed)
[facility]  PROGRESS NOTE  Rhonda Lynch GYJ:[id number] DOB: [birth year] DOA: 04/17/2018 PCP: Clent Demark, PA-C   Brief HPI:   62 year old female with a history of ANCA related vasculitis, on hemodialysis came to hospital with right flank pain.  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  Patient denies any symptoms of cough or shortness of breath.  Chest x-ray is clear.  CT renal stone study showed no acute abnormality.    Subjective   Patient seen and examined, says pain is better.    Assessment/Plan:     1. Healthcare associated pneumonia-CT abdomen pelvis shows right lower lobe infiltrate, patient started on vancomycin and Zosyn. 1/2 bottle blood culture growing micrococcus species. 2. Hypersomnolence-resolved, likely from IV Dilaudid.  IV Dilaudid was discontinued.  Continue oxycodone 5 mg every 4 hours as needed for pain.   3. ESRD-on hemodialysis, Tuesday Thursday and Saturday.  Nephrology has been consulted.  Patient getting dialysis today. 4. 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  Prednisone has been tapered off as per patient. 5. Anemia, thrombocytopenia-chronic, platelet count of 64 6. Diabetes mellitus, type II-continue Lantus, sliding scale insulin with NovoLog.  Blood glucose is well controlled    DVT prophylaxis: SCDs  Code Status: Full code  Family Communication: No family at bedside  Disposition Plan: likely home in 1 to 2 days on oral antibiotics if remains clinically stable   Consultants:  Nephrology  Procedures:  None   Antibiotics:   Anti-infectives (From admission, onward)   Start     Dose/Rate Route Frequency Ordered Stop   04/22/18 1200  vancomycin (VANCOCIN) IVPB 750 mg/150 ml premix     750 mg 150 mL/hr over 60 Minutes Intravenous Every T-Th-Sa (Hemodialysis) 04/20/18 0827     04/22/18 1045  Vancomycin (VANCOCIN) 750-5 MG/150ML-% IVPB    Note to  Pharmacy:  [facility]   : cabinet override      04/22/18 1045 04/22/18 1054   04/20/18 0900  piperacillin-tazobactam (ZOSYN) IVPB 3.375 g     3.375 g 12.5 mL/hr over 240 Minutes Intravenous Every 12 hours 04/20/18 0827     04/20/18 0830  vancomycin (VANCOCIN) 1,250 mg in sodium chloride 0.9 % 250 mL IVPB     1,250 mg 166.7 mL/hr over 90 Minutes Intravenous  Once 04/20/18 0827 04/20/18 1119   04/19/18 1500  metroNIDAZOLE (FLAGYL) IVPB 500 mg  Status:  Discontinued     500 mg 100 mL/hr over 60 Minutes Intravenous Every 8 hours 04/19/18 1441 04/20/18 0814   04/18/18 0600  cefTRIAXone (ROCEPHIN) 1 g in sodium chloride 0.9 % 100 mL IVPB  Status:  Discontinued     1 g 200 mL/hr over 30 Minutes Intravenous Every 24 hours 04/18/18 0458 04/20/18 0814       Objective   Vitals:   04/22/18 1100 04/22/18 1130 04/22/18 1148 04/22/18 1236  BP: (!) 93/35 93/75 (!) 157/87 113/76  Pulse: (!) 42 60 (!) 55 (!) 109  Resp:   16 15  Temp:   98 F (36.7 C) 98.3 F (36.8 C)  TempSrc:   Oral Oral  SpO2:   100% 100%  Weight:   61 kg   Height:        Intake/Output Summary (Last 24 hours) at 04/22/2018 1640 Last data filed at 04/22/2018 1148 Gross per 24 hour  Intake 107.14 ml  Output 2364 ml  Net -2256.86 ml   [employer]  04/20/18 0500 04/22/18 0740 04/22/18 1148  Weight: 61.9 kg 63.4 kg 61 kg     Physical Examination:    Mouth: Oral mucosa is moist, no lesions on palate,  Neck: Supple, no deformities, masses, or tenderness Lungs: Normal respiratory effort, bilateral clear to auscultation, no crackles or wheezes.  Heart: Regular rate and rhythm, S1 and S2 normal, no murmurs, rubs auscultated Abdomen: BS normoactive,soft,nondistended,non-tender to palpation,no organomegaly Extremities: No pretibial edema, no erythema, no cyanosis, no clubbing Neuro : Alert and oriented to time, place and person, No focal deficits     Data Reviewed: I have personally reviewed following labs  and imaging studies  CBG: Recent Labs  Lab 04/21/18 1637 04/21/18 2124 04/22/18 0943 04/22/18 1136 04/22/18 1243  GLUCAP 154* 133* 92 129* 102*    CBC: Recent Labs  Lab 04/17/18 2147 04/17/18 2155 04/18/18 0529 04/19/18 0830 04/20/18 0547 04/22/18 0810  WBC 5.4  --  5.2 7.9 10.5 6.1  NEUTROABS 3.9  --  3.6  --   --   --   HGB 10.0* 11.2* 9.6* 9.7* 9.9* 8.0*  HCT 32.8* 33.0* 30.9* 31.7* 31.4* 25.5*  MCV 86.3  --  84.9 85.0 83.5 82.0  PLT 66*  --  63* 64* 66* 102*    Basic Metabolic Panel: Recent Labs  Lab 04/18/18 0529 04/19/18 0830 04/20/18 0547 04/21/18 0444 04/22/18 0815  NA 140 136 133* 134* 135  K 2.9* 3.5 3.8 4.0 4.1  CL 98 95* 96* 96* 98  CO2 30 28 23 23 26   GLUCOSE 211* 223* 111* 148* 133*  BUN 11 26* 18 39* 51*  CREATININE 3.19* 4.62* 3.14* 4.52* 6.10*  CALCIUM 7.9* 7.9* 7.7* 7.8* 7.5*  PHOS  --  5.3*  --   --  6.3*    Recent Results (from the past 240 hour(s))  Culture, blood (routine x 2)     Status: None (Preliminary result)   Collection Time: 04/18/18  5:25 AM  Result Value Ref Range Status   Specimen Description BLOOD RIGHT ANTECUBITAL  Final   Special Requests   Final    BOTTLES DRAWN AEROBIC ONLY Blood Culture results may not be optimal due to an inadequate volume of blood received in culture bottles   Culture   Final    NO GROWTH 4 DAYS Performed at [facility], [address], [place], [place] [address]    Report Status PENDING  Incomplete  Culture, blood (routine x 2)     Status: Abnormal   Collection Time: 04/18/18  5:30 AM  Result Value Ref Range Status   Specimen Description BLOOD RIGHT HAND  Final   Special Requests   Final    BOTTLES DRAWN AEROBIC ONLY Blood Culture results may not be optimal due to an inadequate volume of blood received in culture bottles   Culture  Setup Time   Final    GRAM POSITIVE COCCI IN CLUSTERS AEROBIC BOTTLE ONLY CRITICAL RESULT CALLED TO, READ BACK BY AND VERIFIED WITH: M. PHAM, RPHARMD AT  1735 ON 04/19/18 BY C. JESSUP, MLT.    Culture (A)  Final    MICROCOCCUS SPECIES Standardized susceptibility testing for this organism is not available. Performed at [facility], [place] [address], [place], [place] [address]    Report Status 04/20/2018 FINAL  Final  Blood Culture ID Panel (Reflexed)     Status: None   Collection Time: 04/18/18  5:30 AM  Result Value Ref Range Status   Enterococcus species  NOT DETECTED NOT DETECTED Final   Listeria monocytogenes NOT DETECTED NOT DETECTED Final   Staphylococcus species NOT DETECTED NOT DETECTED Final   Staphylococcus aureus NOT DETECTED NOT DETECTED Final   Streptococcus species NOT DETECTED NOT DETECTED Final   Streptococcus agalactiae NOT DETECTED NOT DETECTED Final   Streptococcus pneumoniae NOT DETECTED NOT DETECTED Final   Streptococcus pyogenes NOT DETECTED NOT DETECTED Final   Acinetobacter baumannii NOT DETECTED NOT DETECTED Final   Enterobacteriaceae species NOT DETECTED NOT DETECTED Final   Enterobacter cloacae complex NOT DETECTED NOT DETECTED Final   Escherichia coli NOT DETECTED NOT DETECTED Final   Klebsiella oxytoca NOT DETECTED NOT DETECTED Final   Klebsiella pneumoniae NOT DETECTED NOT DETECTED Final   Proteus species NOT DETECTED NOT DETECTED Final   Serratia marcescens NOT DETECTED NOT DETECTED Final   Haemophilus influenzae NOT DETECTED NOT DETECTED Final   Neisseria meningitidis NOT DETECTED NOT DETECTED Final   Pseudomonas aeruginosa NOT DETECTED NOT DETECTED Final   Candida albicans NOT DETECTED NOT DETECTED Final   Candida glabrata NOT DETECTED NOT DETECTED Final   Candida krusei NOT DETECTED NOT DETECTED Final   Candida parapsilosis NOT DETECTED NOT DETECTED Final   Candida tropicalis NOT DETECTED NOT DETECTED Final    Comment: Performed at [facility], [address] [place], [place] [address]  Culture, Urine     Status: Abnormal   Collection Time: 04/18/18  8:42 AM  Result Value Ref Range  Status   Specimen Description URINE, CLEAN CATCH  Final   Special Requests   Final    NONE Performed at [facility], [place] [address] [place], [place] [address]    Culture MULTIPLE SPECIES PRESENT, SUGGEST RECOLLECTION (A)  Final   Report Status 04/19/2018 FINAL  Final     Liver Function Tests: Recent Labs  Lab 04/17/18 2147 04/18/18 0529 04/19/18 0830 04/20/18 0547 04/22/18 0815  AST 51* 90*  --  25  --   ALT 26 41  --  25  --   ALKPHOS 73 231*  --  161*  --   BILITOT 0.6 1.4*  --  0.9  --   PROT 4.9* 4.8*  --  4.9*  --   ALBUMIN 2.5* 2.3* 2.2* 2.0* 1.6*   Recent Labs  Lab 04/17/18 2147  LIPASE 37   No results for input(s): AMMONIA in the last 168 hours.  Cardiac Enzymes: Recent Labs  Lab 04/18/18 0529 04/18/18 1116 04/18/18 1653  TROPONINI 0.07* 0.07* 0.08*   BNP (last 3 results) Recent Labs    02/12/18 1534  BNP 225.7*    ProBNP (last 3 results) No results for input(s): PROBNP in the last 8760 hours.    Studies: No results found.  Scheduled Meds: . calcitRIOL  0.25 mcg Oral QODAY  . Chlorhexidine Gluconate Cloth  6 each Topical Q0600  . cyclobenzaprine  10 mg Oral TID  . famotidine  20 mg Oral Daily  . feeding supplement (NEPRO CARB STEADY)  237 mL Oral BID BM  . insulin aspart  0-9 Units Subcutaneous TID WC  . insulin glargine  5 Units Subcutaneous QHS  . mouth rinse  15 mL Mouth Rinse BID  . multivitamin  1 tablet Oral QHS  . polyethylene glycol  17 g Oral Daily      Time spent: 25 min  [facility] Pager [phone]. If 7PM-7AM, please contact night-coverage at [email or web address], Office  [phone]  password [facility]  04/22/2018,  4:40 PM  LOS: 2 days

## 2018-04-22 NOTE — Procedures (Signed)
[facility] HD, hemodynamically stable. Some date confusion. No labs today. Erling Cruz, MD

## 2018-04-22 NOTE — Plan of Care (Signed)
  Problem: Health Behavior/Discharge Planning: Goal: Ability to manage health-related needs will improve Outcome: Not Progressing   Problem: Clinical Measurements: Goal: Respiratory complications will improve Outcome: Not Progressing Pt has not continued incentive spirometer use after reinforcement.   Problem: Nutrition: Goal: Adequate nutrition will be maintained Outcome: Progressing   Problem: Safety: Goal: Ability to remain free from injury will improve Outcome: Progressing

## 2018-04-22 NOTE — Progress Notes (Signed)
[facility]  Patient Status: Active (receiving services up to time of hospitalization)  [facility] is providing the following services: PT and MSW  If patient discharges after hours, please call [phone].   Janae Sauce 04/22/2018, 10:51 AM

## 2018-04-22 NOTE — Progress Notes (Addendum)
2110 pt's CBG 337 with no bedtime coverage, but 5 units Lantus sched for 2200. Paged K. Schorr for short acting coverage.   2130 order for insulin aspart 5 units given

## 2018-04-23 ENCOUNTER — Ambulatory Visit (HOSPITAL_COMMUNITY): Payer: Medicaid Other

## 2018-04-23 ENCOUNTER — Encounter: Payer: Self-pay | Admitting: Vascular Surgery

## 2018-04-23 DIAGNOSIS — D649 Anemia, unspecified: Secondary | ICD-10-CM

## 2018-04-23 DIAGNOSIS — J189 Pneumonia, unspecified organism: Secondary | ICD-10-CM

## 2018-04-23 LAB — CULTURE, BLOOD (ROUTINE X 2): Culture: NO GROWTH

## 2018-04-23 LAB — GLUCOSE, CAPILLARY
GLUCOSE-CAPILLARY: 160 mg/dL — AB (ref 70–99)
GLUCOSE-CAPILLARY: 128 mg/dL — AB (ref 70–99)
Glucose-Capillary: 86 mg/dL (ref 70–99)
Glucose-Capillary: 170 mg/dL — ABNORMAL HIGH (ref 70–99)

## 2018-04-23 MED ORDER — GLUCERNA SHAKE PO LIQD
237.0000 mL | Freq: Two times a day (BID) | ORAL | Status: DC
  Administered 2018-04-23 – 2018-04-24 (×2): 237 mL via ORAL

## 2018-04-23 MED ORDER — CHLORHEXIDINE GLUCONATE CLOTH 2 % EX PADS: 6.0000 | MEDICATED_PAD | Freq: Every day | CUTANEOUS | Status: DC

## 2018-04-23 NOTE — Progress Notes (Signed)
PROGRESS NOTE        PATIENT DETAILS Name: Rhonda Lynch Age: 62 y.o. Sex: female Date of Birth: 1956-07-27 Admit Date: 04/17/2018 Admitting Physician Rise Patience, MD HBZ:JIRCV, Lesli Albee, PA-C  Brief Narrative: Patient is a 62 y.o. 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  Further evaluation revealed pneumonia-patient subsequently admitted to the hospitalist service for further evaluation and treatment.  See below for further details.  Subjective: Right lower chest/flank pain improved.  Lying comfortably in bed.  Assessment/Plan: HCAP: Improved, afebrile-right flank/right lower chest pain has improved.  One set of blood culture positive for micrococcus which is probably contamination, spoke with Dr. Johnnye Sima over the phone-since patient has already been on antibiotics for a few days-repeat blood cultures will probably be sterile, recommends treatment with Ancef or ceftriaxone for at least 1 week (given patient's multiple comorbidities).  ID does not recommend to remove catheter/HD access at this point.  Will discuss with nephrology if this can be done with HD.  Repeat blood cultures today-keep on Zosyn-vancomycin can be discontinued today.  ESRD: HD TTS.  Nephrology following and directing care.  ANCA related vasculitis: No longer on immunosuppression due to lack of efficacy and ESRD.  BOOP: Completed a course of steroids.  Anemia: Likely secondary to ESRD-defer to nephrology.  Thrombocytopenia: Appears to be chronic-follow.  DM-2: CBG stable with SSI and 5 units of Lantus nightly.  DVT Prophylaxis: SCD's  Code Status: Full code   Family Communication: None at bedside  Disposition Plan: Remain inpatient-if clinical improvement continues, home after HD tomorrow.  Antimicrobial  agents: Anti-infectives (From admission, onward)   Start     Dose/Rate Route Frequency Ordered Stop   04/22/18 1200  vancomycin (VANCOCIN) IVPB 750 mg/150 ml premix  Status:  Discontinued     750 mg 150 mL/hr over 60 Minutes Intravenous Every T-Th-Sa (Hemodialysis) 04/20/18 0827 04/23/18 1255   04/22/18 1045  Vancomycin (VANCOCIN) 750-5 MG/150ML-% IVPB    Note to Pharmacy:  Almira Bar   : cabinet override      04/22/18 1045 04/22/18 1054   04/20/18 0900  piperacillin-tazobactam (ZOSYN) IVPB 3.375 g     3.375 g 12.5 mL/hr over 240 Minutes Intravenous Every 12 hours 04/20/18 0827     04/20/18 0830  vancomycin (VANCOCIN) 1,250 mg in sodium chloride 0.9 % 250 mL IVPB     1,250 mg 166.7 mL/hr over 90 Minutes Intravenous  Once 04/20/18 0827 04/20/18 1119   04/19/18 1500  metroNIDAZOLE (FLAGYL) IVPB 500 mg  Status:  Discontinued     500 mg 100 mL/hr over 60 Minutes Intravenous Every 8 hours 04/19/18 1441 04/20/18 0814   04/18/18 0600  cefTRIAXone (ROCEPHIN) 1 g in sodium chloride 0.9 % 100 mL IVPB  Status:  Discontinued     1 g 200 mL/hr over 30 Minutes Intravenous Every 24 hours 04/18/18 0458 04/20/18 0814      Procedures: None  CONSULTS:  nephrology  Time spent: 25- minutes-Greater than 50% of this time was spent in counseling, explanation of diagnosis, planning of further management, and coordination of care.  MEDICATIONS: Scheduled Meds: . calcitRIOL  0.25 mcg Oral QODAY  . Chlorhexidine Gluconate Cloth  6 each Topical Q0600  . [START ON 04/24/2018] Chlorhexidine Gluconate Cloth  6 each Topical Q0600  . cyclobenzaprine  10 mg Oral TID  . famotidine  20 mg Oral Daily  . feeding supplement (GLUCERNA SHAKE)  237 mL Oral BID BM  . insulin aspart  0-9 Units Subcutaneous TID WC  . insulin glargine  5 Units Subcutaneous QHS  . mouth rinse  15 mL Mouth Rinse BID  . multivitamin  1 tablet Oral QHS  . polyethylene glycol  17 g Oral Daily   Continuous Infusions: .  piperacillin-tazobactam (ZOSYN)  IV 3.375 g (04/23/18 1039)   PRN Meds:.acetaminophen **OR** acetaminophen, ondansetron **OR** ondansetron (ZOFRAN) IV, oxyCODONE, zolpidem   PHYSICAL EXAM: Vital signs: Vitals:   04/22/18 2130 04/23/18 0420 04/23/18 1451 04/23/18 1502  BP: 107/72 130/84 117/79 139/64  Pulse: (!) 116 (!) 104 (!) 110 87  Resp:  18 18 18   Temp: 98.9 F (37.2 C) 99 F (37.2 C) 98.5 F (36.9 C) 98.9 F (37.2 C)  TempSrc: Oral Oral Oral Oral  SpO2: 96% 98% 100% 99%  Weight:  61.9 kg    Height:       Filed Weights   04/22/18 0740 04/22/18 1148 04/23/18 0420  Weight: 63.4 kg 61 kg 61.9 kg   Body mass index is 25.78 kg/m.   General appearance :Awake, alert, not in any distress.  Eyes:Pink conjunctiva HEENT: Atraumatic and Normocephalic Neck: supple Resp:Good air entry bilaterally, no added sounds  CVS: S1 S2 regular, no murmurs.  GI: Bowel sounds present, Non tender and not distended with no gaurding, rigidity or rebound.No organomegaly Extremities: B/L Lower Ext shows no edema, both legs are warm to touch Neurology:  speech clear,Non focal, sensation is grossly intact. Psychiatric: Normal judgment and insight. Alert and oriented x 3. Normal mood. Musculoskeletal:No digital cyanosis Skin:No Rash, warm and dry Wounds:N/A  I have personally reviewed following labs and imaging studies  LABORATORY DATA: CBC: Recent Labs  Lab 04/17/18 2147 04/17/18 2155 04/18/18 0529 04/19/18 0830 04/20/18 0547 04/22/18 0810  WBC 5.4  --  5.2 7.9 10.5 6.1  NEUTROABS 3.9  --  3.6  --   --   --   HGB 10.0* 11.2* 9.6* 9.7* 9.9* 8.0*  HCT 32.8* 33.0* 30.9* 31.7* 31.4* 25.5*  MCV 86.3  --  84.9 85.0 83.5 82.0  PLT 66*  --  63* 64* 66* 102*    Basic Metabolic Panel: Recent Labs  Lab 04/18/18 0529 04/19/18 0830 04/20/18 0547 04/21/18 0444 04/22/18 0815  NA 140 136 133* 134* 135  K 2.9* 3.5 3.8 4.0 4.1  CL 98 95* 96* 96* 98  CO2 30 28 23 23 26   GLUCOSE 211* 223*  111* 148* 133*  BUN 11 26* 18 39* 51*  CREATININE 3.19* 4.62* 3.14* 4.52* 6.10*  CALCIUM 7.9* 7.9* 7.7* 7.8* 7.5*  PHOS  --  5.3*  --   --  6.3*    GFR: Estimated Creatinine Clearance: 7.9 mL/min (A) (by C-G formula based on SCr of 6.1 mg/dL (H)).  Liver Function Tests: Recent Labs  Lab 04/17/18 2147 04/18/18 0529 04/19/18 0830 04/20/18 0547 04/22/18 0815  AST 51* 90*  --  25  --   ALT 26 41  --  25  --   ALKPHOS 73 231*  --  161*  --   BILITOT 0.6 1.4*  --  0.9  --   PROT 4.9* 4.8*  --  4.9*  --   ALBUMIN 2.5* 2.3* 2.2* 2.0* 1.6*   Recent Labs  Lab 04/17/18 2147  LIPASE 37  No results for input(s): AMMONIA in the last 168 hours.  Coagulation Profile: No results for input(s): INR, PROTIME in the last 168 hours.  Cardiac Enzymes: Recent Labs  Lab 04/18/18 0529 04/18/18 1116 04/18/18 1653  TROPONINI 0.07* 0.07* 0.08*    BNP (last 3 results) No results for input(s): PROBNP in the last 8760 hours.  HbA1C: No results for input(s): HGBA1C in the last 72 hours.  CBG: Recent Labs  Lab 04/22/18 1705 04/22/18 1932 04/22/18 2100 04/23/18 0756 04/23/18 1218  GLUCAP 126* 281* 337* 86 160*    Lipid Profile: No results for input(s): CHOL, HDL, LDLCALC, TRIG, CHOLHDL, LDLDIRECT in the last 72 hours.  Thyroid Function Tests: No results for input(s): TSH, T4TOTAL, FREET4, T3FREE, THYROIDAB in the last 72 hours.  Anemia Panel: No results for input(s): VITAMINB12, FOLATE, FERRITIN, TIBC, IRON, RETICCTPCT in the last 72 hours.  Urine analysis:    Component Value Date/Time   COLORURINE AMBER (A) 04/17/2018 2312   APPEARANCEUR CLOUDY (A) 04/17/2018 2312   LABSPEC 1.017 04/17/2018 2312   PHURINE 8.0 04/17/2018 2312   GLUCOSEU 50 (A) 04/17/2018 2312   HGBUR MODERATE (A) 04/17/2018 2312   HGBUR trace-intact 03/08/2010 1452   BILIRUBINUR NEGATIVE 04/17/2018 2312   KETONESUR NEGATIVE 04/17/2018 2312   PROTEINUR >=300 (A) 04/17/2018 2312   UROBILINOGEN 0.2  03/08/2010 1452   NITRITE NEGATIVE 04/17/2018 2312   LEUKOCYTESUR TRACE (A) 04/17/2018 2312    Sepsis Labs: Lactic Acid, Venous    Component Value Date/Time   LATICACIDVEN 0.87 03/19/2018 2145    MICROBIOLOGY: Recent Results (from the past 240 hour(s))  Culture, blood (routine x 2)     Status: None   Collection Time: 04/18/18  5:25 AM  Result Value Ref Range Status   Specimen Description BLOOD RIGHT ANTECUBITAL  Final   Special Requests   Final    BOTTLES DRAWN AEROBIC ONLY Blood Culture results may not be optimal due to an inadequate volume of blood received in culture bottles   Culture   Final    NO GROWTH 5 DAYS Performed at [facility], [place] [address] [place], [place] [address]    Report Status 04/23/2018 FINAL  Final  Culture, blood (routine x 2)     Status: Abnormal   Collection Time: 04/18/18  5:30 AM  Result Value Ref Range Status   Specimen Description BLOOD RIGHT HAND  Final   Special Requests   Final    BOTTLES DRAWN AEROBIC ONLY Blood Culture results may not be optimal due to an inadequate volume of blood received in culture bottles   Culture  Setup Time   Final    GRAM POSITIVE COCCI IN CLUSTERS AEROBIC BOTTLE ONLY CRITICAL RESULT CALLED TO, READ BACK BY AND VERIFIED WITH: M. PHAM, RPHARMD AT 1735 ON 04/19/18 BY C. JESSUP, MLT.    Culture (A)  Final    MICROCOCCUS SPECIES Standardized susceptibility testing for this organism is not available. Performed at [facility], [place] [address], [place], [place] [address]    Report Status 04/20/2018 FINAL  Final  Blood Culture ID Panel (Reflexed)     Status: None   Collection Time: 04/18/18  5:30 AM  Result Value Ref Range Status   Enterococcus species NOT DETECTED NOT DETECTED Final   Listeria monocytogenes NOT DETECTED NOT DETECTED Final   Staphylococcus species NOT DETECTED NOT DETECTED Final   Staphylococcus aureus NOT DETECTED NOT DETECTED Final   Streptococcus species NOT DETECTED NOT DETECTED  Final  Streptococcus agalactiae NOT DETECTED NOT DETECTED Final   Streptococcus pneumoniae NOT DETECTED NOT DETECTED Final   Streptococcus pyogenes NOT DETECTED NOT DETECTED Final   Acinetobacter baumannii NOT DETECTED NOT DETECTED Final   Enterobacteriaceae species NOT DETECTED NOT DETECTED Final   Enterobacter cloacae complex NOT DETECTED NOT DETECTED Final   Escherichia coli NOT DETECTED NOT DETECTED Final   Klebsiella oxytoca NOT DETECTED NOT DETECTED Final   Klebsiella pneumoniae NOT DETECTED NOT DETECTED Final   Proteus species NOT DETECTED NOT DETECTED Final   Serratia marcescens NOT DETECTED NOT DETECTED Final   Haemophilus influenzae NOT DETECTED NOT DETECTED Final   Neisseria meningitidis NOT DETECTED NOT DETECTED Final   Pseudomonas aeruginosa NOT DETECTED NOT DETECTED Final   Candida albicans NOT DETECTED NOT DETECTED Final   Candida glabrata NOT DETECTED NOT DETECTED Final   Candida krusei NOT DETECTED NOT DETECTED Final   Candida parapsilosis NOT DETECTED NOT DETECTED Final   Candida tropicalis NOT DETECTED NOT DETECTED Final    Comment: Performed at [facility], [place] [address] [place], [place] [address]  Culture, Urine     Status: Abnormal   Collection Time: 04/18/18  8:42 AM  Result Value Ref Range Status   Specimen Description URINE, CLEAN CATCH  Final   Special Requests   Final    NONE Performed at [facility], [place] [address], [place], [place] [address]    Culture MULTIPLE SPECIES PRESENT, SUGGEST RECOLLECTION (A)  Final   Report Status 04/19/2018 FINAL  Final    RADIOLOGY STUDIES/RESULTS: Dg Chest 2 View  Result Date: 04/17/2018 CLINICAL DATA:  Right-sided rib cage pain x1 day. EXAM: CHEST - 2 VIEW COMPARISON:  04/12/2018 FINDINGS: Cardiomegaly is noted. No alveolar consolidation is seen. Improved aeration of the lungs is identified with interval decrease in alveolar airspace opacities bilaterally. No new pulmonary consolidation nor overt  pulmonary edema is visualized. No effusion or pneumothorax. Nonaneurysmal atherosclerotic aorta is noted. Right IJ dialysis catheter tip terminates at the cavoatrial junction, stable in appearance. Cholecystectomy clips are present in the right upper quadrant. 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 IMPRESSION: Stable cardiomegaly with aortic atherosclerosis. Interval clearing of pulmonary edema. No new alveolar consolidations are noted. Electronically Signed   By: Ashley Royalty M.D.   On: 04/17/2018 22:43   Ct Chest Wo Contrast  Result Date: 04/03/2018 CLINICAL DATA:  Followup for presumed cryptogenic organizing pneumonia. EXAM: CT CHEST WITHOUT CONTRAST TECHNIQUE: Multidetector CT imaging of the chest was performed following the standard protocol without IV contrast. COMPARISON:  Current chest radiograph. Multiple prior chest radiographs. Chest CT 02/17/2018. FINDINGS: Cardiovascular: Heart is mildly enlarged. No pericardial effusion. No coronary artery calcifications. Great vessels normal in caliber. Mild aortic atherosclerosis. Mediastinum/Nodes: No neck base or axillary masses or enlarged lymph nodes. Normal thyroid. There are prominent mediastinal nodes, largest an AP window node measuring 13 mm in short axis, without significant change from the prior CT. Trachea is widely patent. Esophagus is unremarkable. Lungs/Pleura: Small, right greater than left, pleural effusions. Bilateral peribronchovascular ground-glass opacities are noted in all lobes. Mild bronchiectasis and centrilobular emphysema is noted in both lobes. The more extensive airspace opacities noted on the prior study have improved. No new lung abnormalities. No pneumothorax. Upper Abdomen: No acute abnormality. Musculoskeletal: No fracture or acute finding. No osteoblastic or osteolytic lesions. IMPRESSION: 1. There has been significant improvement since the prior chest CT. The bilateral areas of ground-glass lung opacification have  improved. Underlying mild bronchiectasis is  stable. The findings have upper lung predominance. No new lung abnormalities. 2. Small, right greater than left, pleural effusions have increased in size from prior CT. 3. Findings described may reflect improved pulmonary edema or could be due to improved cryptogenic organizing pneumonia or other interstitial lung disease. Aortic Atherosclerosis (ICD10-I70.0). Electronically Signed   By: Lajean Manes M.D.   On: 04/03/2018 12:40   Ct Abdomen Pelvis W Contrast  Result Date: 04/19/2018 CLINICAL DATA:  Acute onset of right upper quadrant abdominal pain. Elevated bilirubin and alkaline phosphatase. EXAM: CT ABDOMEN AND PELVIS WITH CONTRAST TECHNIQUE: Multidetector CT imaging of the abdomen and pelvis was performed using the standard protocol following bolus administration of intravenous contrast. CONTRAST:  133mL OMNIPAQUE IOHEXOL 300 MG/ML  SOLN COMPARISON:  CT of the abdomen and pelvis from 04/17/2018 FINDINGS: Lower chest: There is new consolidation of the right lower lobe, compatible with pneumonia. Mild left basilar atelectasis is noted. The visualized portions of the mediastinum are unremarkable. Hepatobiliary: Mild prominence of the intrahepatic biliary ducts appears relatively stable and likely reflects prior cholecystectomy. The common bile duct is normal in caliber status post cholecystectomy. Clips are noted at the gallbladder fossa. The liver is otherwise unremarkable in appearance. Pancreas: The pancreas is within normal limits. Spleen: A nonspecific 1.4 cm hypodensity is noted within the spleen; a smaller better defined 8 mm cyst is also seen. Adrenals/Urinary Tract: The adrenal glands are unremarkable in appearance. The kidneys are within normal limits. There is no evidence of hydronephrosis. No renal or ureteral stones are identified. Mild nonspecific left-sided perinephric stranding is seen. Stomach/Bowel: The stomach is unremarkable in appearance. The  small bowel is within normal limits. The appendix is normal in caliber, without evidence of appendicitis. The colon is unremarkable in appearance. Vascular/Lymphatic: Scattered calcification is seen along the abdominal aorta and its branches. The abdominal aorta is otherwise grossly unremarkable. The inferior vena cava is grossly unremarkable. No retroperitoneal lymphadenopathy is seen. No pelvic sidewall lymphadenopathy is identified. Reproductive: The bladder is mildly distended and grossly unremarkable. The patient is status post hysterectomy. No suspicious adnexal masses are seen. Other: No additional soft tissue abnormalities are seen. Musculoskeletal: No acute osseous abnormalities are identified. The visualized musculature is unremarkable in appearance. IMPRESSION: 1. New consolidation of the right lower lung lobe, compatible with pneumonia. 2. Status post cholecystectomy. Biliary tree is grossly unremarkable in appearance. No focal abnormalities identified at the right upper quadrant at this time. 3. Nonspecific 1.4 cm hypodensity within the spleen; smaller better defined 8 mm cyst also seen. Aortic Atherosclerosis (ICD10-I70.0). Electronically Signed   By: Garald Balding M.D.   On: 04/19/2018 23:40   Dg Chest Port 1 View  Result Date: 04/12/2018 CLINICAL DATA:  Shortness of breath. End-stage renal disease on dialysis. EXAM: PORTABLE CHEST 1 VIEW COMPARISON:  04/03/2018 FINDINGS: Right jugular central venous dialysis catheter remains in appropriate position. Stable mild cardiomegaly. Diffuse mixed interstitial and airspace disease shows no significant change, suspicious for pulmonary edema. No evidence of superimposed pulmonary consolidation. IMPRESSION: Stable diffuse mixed interstitial and airspace disease, suspicious for diffuse pulmonary edema. Stable cardiomegaly. Electronically Signed   By: Earle Gell M.D.   On: 04/12/2018 11:33   Dg Chest Port 1 View  Result Date: 04/03/2018 CLINICAL DATA:   Hypoxemia.  History of end-stage renal disease. EXAM: PORTABLE CHEST 1 VIEW COMPARISON:  04/02/2018 and older exams. FINDINGS: Since the previous day's study, interstitial opacities are similar. Hazy intervening airspace opacities have improved. Hemidiaphragms are better defined. Findings are  consistent with improved pulmonary edema reduced small pleural effusions. No new lung abnormalities. No pneumothorax. Cardiac silhouette borderline enlarged. Right internal jugular dual-lumen central venous catheter is stable. IMPRESSION: 1. Improved pulmonary edema when compared to the previous day's study. No new abnormalities. Electronically Signed   By: Lajean Manes M.D.   On: 04/03/2018 09:12   Dg Chest Port 1 View  Result Date: 04/02/2018 CLINICAL DATA:  Shortness of breath. EXAM: PORTABLE CHEST 1 VIEW COMPARISON:  Radiographs of March 23, 2018. FINDINGS: Stable cardiomegaly. Atherosclerosis of thoracic aorta is noted. Stable diffuse reticular densities are noted throughout both lungs which may represent chronic interstitial lung disease or scarring, but acute superimposed edema or inflammation cannot be excluded. No pneumothorax or significant pleural effusion is noted. Right internal jugular dialysis catheter is unchanged in position. Bony thorax is unremarkable. IMPRESSION: Stable bilateral diffuse reticular lung opacities are noted which may represent chronic interstitial lung disease or scarring, but acute superimposed edema or inflammation cannot be excluded. Electronically Signed   By: Marijo Conception, M.D.   On: 04/02/2018 14:56   Ct Renal Stone Study  Result Date: 04/17/2018 CLINICAL DATA:  62 y/o  F; flank pain. EXAM: CT ABDOMEN AND PELVIS WITHOUT CONTRAST TECHNIQUE: Multidetector CT imaging of the abdomen and pelvis was performed following the standard protocol without IV contrast. COMPARISON:  03/19/2018 CT abdomen and pelvis. FINDINGS: Lower chest: Near complete resolution of ground-glass opacities in  the lung bases. Hepatobiliary: No focal liver abnormality is seen. Status post cholecystectomy. No biliary dilatation. Pancreas: Unremarkable. No pancreatic ductal dilatation or surrounding inflammatory changes. Spleen: Normal in size without focal abnormality. Adrenals/Urinary Tract: Stable left perinephric fat stranding. Normal adrenal glands. No hydronephrosis or urinary stone disease. Normal bladder. Stomach/Bowel: Stomach is within normal limits. Appendix appears normal. No evidence of bowel wall thickening, distention, or inflammatory changes. Vascular/Lymphatic: Aortic atherosclerosis. No enlarged abdominal or pelvic lymph nodes. 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 Other: No abdominal wall hernia or abnormality. No abdominopelvic ascites. Musculoskeletal: No fracture is seen. Lumbar spondylosis with L4-5 grade 1 anterolisthesis and prominent lower lumbar facet arthrosis. IMPRESSION: 1. No acute process identified. No hydronephrosis or urinary stone disease. 2. Near complete resolution of ground-glass opacities at the lung bases. 3. Stable mild left perinephric fat stranding. 4. 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 Electronically Signed   By: Kristine Garbe M.D.   On: 04/17/2018 23:10     LOS: 3 days   Oren Binet, MD  [facility]  If 7PM-7AM, please contact night-coverage  Please page via [email or web address]-Password [email or web address]-click on MD name and type text message  04/23/2018, 4:35 PM

## 2018-04-23 NOTE — Progress Notes (Signed)
Dialysis: TTS  4h 60kg 2/2.25 bath TDC/ maturing LUA BC AVF (03/06/18) Hep 2000 - venofer 100 every HD x 10 - mircera 225 ug every 2 wks  - vit d 0.25 ug mwf  Impression: 1ESRD - cont on HD TTS. 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  3 Volume - no sig vol overload on exam, up 2kg today 2 R flank pain / R PNA - new RLL infiltrates. On Zosyn and Vanco 4 IS lung disease - presumed BOOP, 6/19, never biopsied. On oxy. 5 Hx drug abuse -   7 HTN - no bp meds at home. 8 Thrombocytopenia - chronic 9 Hep C+  Plan - Plan HD on Thursday  Subjective: Interval History: pain improving  Objective: Vital signs in last 24 hours: Temp:  [98.9 F (37.2 C)-99 F (37.2 C)] 99 F (37.2 C) (08/14 0420) Pulse Rate:  [104-116] 104 (08/14 0420) Resp:  [18] 18 (08/14 0420) BP: (107-130)/(72-84) 130/84 (08/14 0420) SpO2:  [96 %-98 %] 98 % (08/14 0420) Weight:  [61.9 kg] 61.9 kg (08/14 0420) Weight change:   Intake/Output from previous day: 08/13 0701 - 08/14 0700 In: 50 [IV Piggyback:50] Out: 2364  Intake/Output this shift: Total I/O In: 240 [P.O.:240] Out: 2 [Urine:1; Stool:1]  General appearance: alert and cooperative Chest wall: right side decreased BS and rales Cardio: regular rate and rhythm, S1, S2 normal, no murmur, click, rub or gallop Extremities: tr to 1+ edema bilat  Lab Results: Recent Labs    04/22/18 0810  WBC 6.1  HGB 8.0*  HCT 25.5*  PLT 102*   BMET:  Recent Labs    04/21/18 0444 04/22/18 0815  NA 134* 135  K 4.0 4.1  CL 96* 98  CO2 23 26  GLUCOSE 148* 133*  BUN 39* 51*  CREATININE 4.52* 6.10*  CALCIUM 7.8* 7.5*   No results for input(s): PTH in the last 72 hours. Iron Studies: No results for input(s): IRON, TIBC, TRANSFERRIN, FERRITIN in the last 72 hours. Studies/Results: No results found.  Scheduled: . calcitRIOL  0.25 mcg Oral QODAY  . Chlorhexidine Gluconate Cloth  6  each Topical Q0600  . cyclobenzaprine  10 mg Oral TID  . famotidine  20 mg Oral Daily  . feeding supplement (GLUCERNA SHAKE)  237 mL Oral BID BM  . insulin aspart  0-9 Units Subcutaneous TID WC  . insulin glargine  5 Units Subcutaneous QHS  . mouth rinse  15 mL Mouth Rinse BID  . multivitamin  1 tablet Oral QHS  . polyethylene glycol  17 g Oral Daily    LOS: 3 days   Rhonda Lynch 04/23/2018,1:35 PM

## 2018-04-23 NOTE — Progress Notes (Deleted)
POST OPERATIVE OFFICE NOTE    CC:  F/u for surgery  HPI:  Rhonda Lynch is a 62 y.o. female consulted regarding permanent access for hemodialysis 02/28/2018.  Left brachiocephalic AV fistula was created on 03/05/2018 by Dr. Donnetta Hutching.  Post op day 1 the patient has more pain and swelling in the antecubital space than typical.  She was taken back to the OR by Dr. Scot Dock for Exploration left arm and evacuation of small hematoma.  According to his findings there was a small hematoma which was evacuated.  There was really no significant bleeding noted.  Several small areas were cauterized. She was discharged with reported decreased pain and palpable thrill in the fistula.    She is here today for follow up evaluation and recommendations.  She is now 6 weeks post op.    No Known Allergies  No current facility-administered medications for this visit.    No current outpatient medications on file.   Facility-Administered Medications Ordered in Other Visits  Medication Dose Route Frequency Provider Last Rate Last Dose  . acetaminophen (TYLENOL) tablet 650 mg  650 mg Oral Q6H PRN Rise Patience, MD       Or  . acetaminophen (TYLENOL) suppository 650 mg  650 mg Rectal Q6H PRN Rise Patience, MD      . calcitRIOL (ROCALTROL) capsule 0.25 mcg  0.25 mcg Oral Beverly Gust Rise Patience, MD   0.25 mcg at 04/20/18 0943  . Chlorhexidine Gluconate Cloth 2 % PADS 6 each  6 each Topical Q0600 Estanislado Emms, MD   6 each at 04/23/18 0617  . cyclobenzaprine (FLEXERIL) tablet 10 mg  10 mg Oral TID Oswald Hillock, MD   10 mg at 04/23/18 1037  . famotidine (PEPCID) tablet 20 mg  20 mg Oral Daily Harvel Quale, RPH   20 mg at 04/23/18 1037  . feeding supplement (NEPRO CARB STEADY) liquid 237 mL  237 mL Oral BID BM Rise Patience, MD 1 mL/hr at 04/18/18 1302 237 mL at 04/23/18 1037  . insulin aspart (novoLOG) injection 0-9 Units  0-9 Units Subcutaneous TID WC Rise Patience, MD   5 Units at  04/22/18 1936  . insulin glargine (LANTUS) injection 5 Units  5 Units Subcutaneous QHS Rise Patience, MD   5 Units at 04/22/18 2248  . MEDLINE mouth rinse  15 mL Mouth Rinse BID Oswald Hillock, MD   15 mL at 04/22/18 2249  . multivitamin (RENA-VIT) tablet 1 tablet  1 tablet Oral QHS Rise Patience, MD   1 tablet at 04/22/18 2248  . ondansetron (ZOFRAN) tablet 4 mg  4 mg Oral Q6H PRN Rise Patience, MD       Or  . ondansetron [facility]) injection 4 mg  4 mg Intravenous Q6H PRN Rise Patience, MD      . oxyCODONE (Oxy IR/ROXICODONE) immediate release tablet 5 mg  5 mg Oral Q4H PRN Oswald Hillock, MD   5 mg at 04/23/18 1034  . piperacillin-tazobactam (ZOSYN) IVPB 3.375 g  3.375 g Intravenous Q12H Johnnette Gourd D, RPH 12.5 mL/hr at 04/23/18 1039 3.375 g at 04/23/18 1039  . polyethylene glycol (MIRALAX / GLYCOLAX) packet 17 g  17 g Oral Daily Oswald Hillock, MD   17 g at 04/23/18 1037  . vancomycin (VANCOCIN) IVPB 750 mg/150 ml premix  750 mg Intravenous Q T,Th,Sa-HD Dang, Thuy D, RPH      . zolpidem (AMBIEN) tablet 5  mg  5 mg Oral QHS PRN Rise Patience, MD   5 mg at 04/21/18 0051     ROS:  See HPI  Physical Exam:  There were no vitals filed for this visit.  Incision:  *** Extremities:  *** Neuro: *** Abdomen:  ***  Assessment/Plan:  This is a 62 y.o. female who is s/p: ***  -***   Leontine Locket, PA-C [facility] [phone]  Clinic MD:  ***

## 2018-04-23 NOTE — Plan of Care (Signed)
  Problem: Clinical Measurements: Goal: Respiratory complications will improve Outcome: Progressing   Problem: Activity: Goal: Risk for activity intolerance will decrease Outcome: Not Progressing   Problem: Nutrition: Goal: Adequate nutrition will be maintained Outcome: Progressing   Problem: Pain Managment: Goal: General experience of comfort will improve Outcome: Progressing

## 2018-04-23 NOTE — Progress Notes (Signed)
Nutrition Follow-up  DOCUMENTATION CODES:   Not applicable  INTERVENTION:   -Continue renal MVI daily -D/c Nepro Shake due to poor acceptance -Glucerna Shake po BID, each supplement provides 220 kcal and 10 grams of protein  NUTRITION DIAGNOSIS:   Increased nutrient needs related to chronic illness as evidenced by estimated needs.  GOAL:   Patient will meet greater than or equal to 90% of their needs  MONITOR:   PO intake, Supplement acceptance, Labs, Weight trends, Skin, I & O's  REASON FOR ASSESSMENT:   Malnutrition Screening Tool    ASSESSMENT:   BRAILEIGH LANDENBERGER is a 62 y.o. 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    Case discussed with RN prior to visit, who reports that pt does not like the Nepro supplements. Pt will likely discharge in 1-2 days, once transitioned to oral antibiotics.   Reviewed I/O's: -2.3 L x 24 hours and -437 L since admission.   Last HD 04/22/18.   Spoke with pt at bedside, who was consuming a cup of ice chips at time of visit. She shares that her appetite has improved and estimates she is consuming about 25% of meal trays (meal intake variable- noted 5-7% of meal intake). Pt confirms that she does not like the Nepro shakes ("they're way too thick"). Reviewed other supplements on formulary- due to elevated CBGS, will trial Glucerna, which pt is amenable to.   Labs reviewed: CBGS: 92-337 (inpatient orders for glycemic control are 0-9 units insulin aspart TID with meals and 5 units insulin aspart q HS). Corrected calcium: 9.4, K WDL, Phos 6.3.   Diet Order:   Diet Order            Diet Carb Modified Fluid consistency: Thin; Room service appropriate? Yes  Diet  effective now              EDUCATION NEEDS:   Education needs have been addressed  Skin:  Skin Assessment: Reviewed RN Assessment  Last BM:  04/23/18  Height:   Ht Readings from Last 1 Encounters:  04/18/18 5\' 1"  (1.549 m)    Weight:   Wt Readings from Last 1 Encounters:  04/23/18 61.9 kg    Ideal Body Weight:  47.7 kg  BMI:  Body mass index is 25.78 kg/m.  Estimated Nutritional Needs:   Kcal:  1500-1700  Protein:  65-80 grams  Fluid:  per MD    Hiep Ollis A. Jimmye Norman, RD, LDN, CDE Pager: [phone] After hours Pager: [phone]

## 2018-04-23 NOTE — Progress Notes (Signed)
Inpatient Diabetes Program Recommendations  AACE/ADA: New Consensus Statement on Inpatient Glycemic Control (2019)  Target Ranges:  Prepandial:   less than 140 mg/dL      Peak postprandial:   less than 180 mg/dL (1-2 hours)      Critically ill patients:  140 - 180 mg/dL   Results for [name], [name] (MRN [id number]) as of 04/23/2018 11:23  Ref. Range 04/22/2018 09:43 04/22/2018 11:36 04/22/2018 12:43 04/22/2018 17:05 04/22/2018 19:32 04/22/2018 21:00 04/23/2018 07:56  Glucose-Capillary Latest Ref Range: 70 - 99 mg/dL 92 129 (H) 102 (H) 126 (H) 281 (H) 337 (H) 86   Review of Glycemic Control  Diabetes history: DM2 Outpatient Diabetes medications: Lantus 5 units QHS, Novolog 0-9 units TID with meals, Novolog 0-5 units QHS Current orders for Inpatient glycemic control: Lantus 5 units QHS, Novolog 0-9 units TID with meals  Inpatient Diabetes Program Recommendations: Insulin - Meal Coverage: Please consider ordering Novolog 2 unit TID with meals for meal coverage if patient eats at least 50% of meals.  Thanks, Barnie Alderman, RN, MSN, CDE Diabetes Coordinator Inpatient Diabetes Program [phone] (Team Pager from 8am to 5pm)

## 2018-04-24 DIAGNOSIS — E1121 Type 2 diabetes mellitus with diabetic nephropathy: Secondary | ICD-10-CM

## 2018-04-24 DIAGNOSIS — N186 End stage renal disease: Secondary | ICD-10-CM

## 2018-04-24 DIAGNOSIS — J8489 Other specified interstitial pulmonary diseases: Secondary | ICD-10-CM

## 2018-04-24 LAB — GLUCOSE, CAPILLARY
GLUCOSE-CAPILLARY: 237 mg/dL — AB (ref 70–99)
Glucose-Capillary: 92 mg/dL (ref 70–99)
Glucose-Capillary: 120 mg/dL — ABNORMAL HIGH (ref 70–99)

## 2018-04-24 LAB — RENAL FUNCTION PANEL
ANION GAP: 10 (ref 5–15)
Albumin: 1.5 g/dL — ABNORMAL LOW (ref 3.5–5.0)
BUN: 34 mg/dL — ABNORMAL HIGH (ref 8–23)
CALCIUM: 7.7 mg/dL — AB (ref 8.9–10.3)
CO2: 25 mmol/L (ref 22–32)
Chloride: 103 mmol/L (ref 98–111)
Creatinine, Ser: 5.88 mg/dL — ABNORMAL HIGH (ref 0.44–1.00)
GFR calc Af Amer: 8 mL/min — ABNORMAL LOW (ref >60)
GFR calc non Af Amer: 7 mL/min — ABNORMAL LOW (ref >60)
GLUCOSE: 121 mg/dL — AB (ref 70–99)
Phosphorus: 5.2 mg/dL — ABNORMAL HIGH (ref 2.5–4.6)
Potassium: 3.9 mmol/L (ref 3.5–5.1)
SODIUM: 138 mmol/L (ref 135–145)

## 2018-04-24 LAB — CBC
HCT: 26.5 % — ABNORMAL LOW (ref 36.0–46.0)
Hemoglobin: 8.2 g/dL — ABNORMAL LOW (ref 12.0–15.0)
MCH: 25 pg — AB (ref 26.0–34.0)
MCHC: 30.9 g/dL (ref 30.0–36.0)
MCV: 80.8 fL (ref 78.0–100.0)
PLATELETS: 150 10*3/uL (ref 150–400)
RBC: 3.28 MIL/uL — ABNORMAL LOW (ref 3.87–5.11)
RDW: 18.6 % — AB (ref 11.5–15.5)
WBC: 4.1 10*3/uL (ref 4.0–10.5)

## 2018-04-24 MED ORDER — DEXTROSE 5 % IV SOLN: 2.0000 g | INTRAVENOUS | Status: DC | PRN

## 2018-04-24 MED ORDER — PENTAFLUOROPROP-TETRAFLUOROETH EX AERO: 1.0000 "application " | INHALATION_SPRAY | CUTANEOUS | Status: DC | PRN

## 2018-04-24 MED ORDER — SODIUM CHLORIDE 0.9 % IV SOLN: 100.0000 mL | INTRAVENOUS | Status: DC | PRN

## 2018-04-24 MED ORDER — DIPHENHYDRAMINE HCL 25 MG PO CAPS: 25.0000 mg | ORAL_CAPSULE | Freq: Four times a day (QID) | ORAL | Status: DC | PRN

## 2018-04-24 MED ORDER — ALTEPLASE 2 MG IJ SOLR: 2.0000 mg | Freq: Once | INTRAMUSCULAR | Status: DC | PRN

## 2018-04-24 MED ORDER — AMOXICILLIN-POT CLAVULANATE 500-125 MG PO TABS: 1.0000 | ORAL_TABLET | Freq: Every evening | ORAL | 0 refills | Status: DC

## 2018-04-24 MED ORDER — HEPARIN SODIUM (PORCINE) 1000 UNIT/ML DIALYSIS: 20.0000 [IU]/kg | INTRAMUSCULAR | Status: DC | PRN

## 2018-04-24 MED ORDER — METOPROLOL TARTRATE 25 MG PO TABS: 12.5000 mg | ORAL_TABLET | Freq: Two times a day (BID) | ORAL | 0 refills | Status: DC

## 2018-04-24 MED ORDER — LIDOCAINE HCL (PF) 1 % IJ SOLN: 5.0000 mL | INTRAMUSCULAR | Status: DC | PRN

## 2018-04-24 MED ORDER — ACETAMINOPHEN 325 MG PO TABS: 650.0000 mg | ORAL_TABLET | Freq: Four times a day (QID) | ORAL | Status: DC | PRN

## 2018-04-24 MED ORDER — GLUCERNA SHAKE PO LIQD: 237.0000 mL | Freq: Two times a day (BID) | ORAL | 0 refills | Status: DC

## 2018-04-24 MED ORDER — HEPARIN SODIUM (PORCINE) 1000 UNIT/ML DIALYSIS: 1000.0000 [IU] | INTRAMUSCULAR | Status: DC | PRN

## 2018-04-24 MED ORDER — LIDOCAINE-PRILOCAINE 2.5-2.5 % EX CREA: 1.0000 "application " | TOPICAL_CREAM | CUTANEOUS | Status: DC | PRN

## 2018-04-24 NOTE — Discharge Summary (Addendum)
PATIENT DETAILS Name: Rhonda Lynch Age: 62 y.o. Sex: female Date of Birth: 13-Jan-1956 MRN: [id number]. Admitting Physician: Rise Patience, MD VPX:[id number], Lesli Albee, PA-C  Admit Date: 04/17/2018 Discharge date: 04/25/2018  Recommendations for Outpatient Follow-up:  1. Follow up with PCP in 1-2 weeks 2. Please obtain BMP/CBC in one week  Admitted From:  Home   Disposition: [place]: Yes  Equipment/Devices: None  Discharge Condition: Stable  CODE STATUS: FULL CODE  Diet recommendation:  Heart Healthy / Carb Modified   Brief Summary: See H&P, Labs, Consult and Test reports for all details in brief, Patient is a 62 y.o. 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  Further evaluation revealed pneumonia-patient subsequently admitted to the hospitalist service for further evaluation and treatment.  See below for further details.  Brief Hospital Course: HCAP: Improved, afebrile-right flank/right lower chest pain has significantly improved.  One set of blood culture positive for micrococcus which is probably contamination, spoke with Dr. Johnnye Sima over the phone-since patient until recently was on immunosuppression with Cytoxan and prednisone-he recommends treating with 1 week of either Ancef or ceftriaxone.  Repeat blood cultures are negative so far.  Not sure if Ancef will cover the patient's healthcare associated pneumonia, hence will give her a few days of Augmentin as well.  Nephrology is aware of patient requiring Ancef with hemodialysis (spoke with Dr. Florene Glen).   ESRD: HD TTS.  Nephrology followed closely.  ANCA related vasculitis: No longer on immunosuppression due to lack of efficacy and ESRD.  BOOP: Completed a course of steroids.  Anemia: Likely secondary to ESRD-defer to nephrology.  Thrombocytopenia:  Appears to be chronic-follow.  DM-2: CBG stable with SSI and 5 units of Lantus nightly.  Procedures/Studies: None  Discharge Diagnoses:  Principal Problem:   Abdominal pain Active Problems:   ESRD (end stage renal disease) ([place])   Anemia   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[place])   DM (diabetes mellitus), type 2 with renal complications ([place])   Right flank pain   Discharge Instructions:  Activity:  As tolerated with Full fall precautions use walker/cane & assistance as needed   Discharge Instructions    Diet - low sodium heart healthy   Complete by:  As directed    Discharge instructions   Complete by:  As directed    Follow with PCP Clent Demark, PA-C in 1 week  Follow at Dialysis center as previous  Please get a complete blood count and chemistry panel checked by your Primary MD at your next visit, and again as instructed by your Primary MD.  Get Medicines reviewed and adjusted: Please take all your medications with you for your next visit with your Primary MD  Laboratory/radiological data: Please request your Primary MD to go over all hospital tests and procedure/radiological results at the follow up, please ask your Primary MD to get all Hospital records sent to his/her office.  In some cases, they will be blood work, cultures and biopsy results pending at the time of your discharge. Please request that your primary care M.D. follows up on these results.  Also Note the following: If you experience worsening of your admission symptoms, develop shortness of breath, life threatening emergency, suicidal or homicidal thoughts you must seek medical attention immediately by calling 911 or calling your MD immediately  if symptoms less severe.  You must read  complete instructions/literature along with all the possible adverse reactions/side effects for all the Medicines you take and that have been prescribed to you. Take any new Medicines after  you have completely understood and accpet all the possible adverse reactions/side effects.   Do not drive when taking Pain medications or sleeping medications (Benzodaizepines)  Do not take more than prescribed Pain, Sleep and Anxiety Medications. It is not advisable to combine anxiety,sleep and pain medications without talking with your primary care practitioner  Special Instructions: If you have smoked or chewed Tobacco  in the last 2 yrs please stop smoking, stop any regular Alcohol  and or any Recreational drug use.  Wear Seat belts while driving.  Please note: You were cared for by a hospitalist during your hospital stay. Once you are discharged, your primary care physician will handle any further medical issues. Please note that NO REFILLS for any discharge medications will be authorized once you are discharged, as it is imperative that you return to your primary care physician (or establish a relationship with a primary care physician if you do not have one) for your post hospital discharge needs so that they can reassess your need for medications and monitor your lab values.   Increase activity slowly   Complete by:  As directed      Allergies as of 04/25/2018   No Known Allergies     Medication List    TAKE these medications   acetaminophen 325 MG tablet Commonly known as:  TYLENOL Take 2 tablets (650 mg total) by mouth every 6 (six) hours as needed for mild pain (or Fever >/= 101).   amoxicillin-clavulanate 500-125 MG tablet Commonly known as:  AUGMENTIN Take 1 tablet (500 mg total) by mouth every evening for 3 days.   benzonatate 100 MG capsule Commonly known as:  TESSALON Take 1 capsule (100 mg total) by mouth every 8 (eight) hours.   calcitRIOL 0.25 MCG capsule Commonly known as:  ROCALTROL Take 1 capsule (0.25 mcg total) by mouth every other day.   ceFAZolin 2 g in dextrose 5 % 100 mL ivpb Inject 2 g into the vein every dialysis for up to 3 doses.   feeding  supplement (GLUCERNA SHAKE) Liqd Take 237 mLs by mouth 2 (two) times daily between meals.   insulin aspart 100 UNIT/ML injection Commonly known as:  novoLOG Inject 0-9 Units into the skin 3 (three) times daily with meals.   insulin aspart 100 UNIT/ML injection Commonly known as:  novoLOG Inject 0-5 Units into the skin at bedtime.   insulin glargine 100 UNIT/ML injection Commonly known as:  LANTUS Inject 0.05 mLs (5 Units total) into the skin at bedtime.   metoprolol tartrate 25 MG tablet Commonly known as:  LOPRESSOR Take 0.5 tablets (12.5 mg total) by mouth 2 (two) times daily.   multivitamin Tabs tablet Take 1 tablet by mouth at bedtime.   ondansetron 4 MG tablet Commonly known as:  ZOFRAN Take 1 tablet (4 mg total) by mouth every 6 (six) hours as needed for nausea.   traMADol 50 MG tablet Commonly known as:  ULTRAM Take 1 tablet (50 mg total) by mouth every 12 (twelve) hours as needed (refractory pain).   zolpidem 5 MG tablet Commonly known as:  AMBIEN Take 1 tablet (5 mg total) by mouth at bedtime as needed for sleep.      Follow-up Information    [facility] Follow up.   Why:  Appointment 05-13-18 at  1610 am  Contact information: [place] [id number] [phone]       [facility] Follow up.   Why:  follow as scheduled       [facility].   Specialty:  Home Health Services Why:  Physical therapist and social worker to follow up with you at home as prior to admission.  Contact information: [address] [place] [address] [phone]          No Known Allergies   Consultations:   nephrology   Other Procedures/Studies: Dg Chest 2 View  Result Date: 04/17/2018 CLINICAL DATA:  Right-sided rib cage pain x1 day. EXAM: CHEST - 2 VIEW COMPARISON:  04/12/2018 FINDINGS: Cardiomegaly is noted. No alveolar consolidation is seen. Improved  aeration of the lungs is identified with interval decrease in alveolar airspace opacities bilaterally. No new pulmonary consolidation nor overt pulmonary edema is visualized. No effusion or pneumothorax. Nonaneurysmal atherosclerotic aorta is noted. Right IJ dialysis catheter tip terminates at the cavoatrial junction, stable in appearance. Cholecystectomy clips are present in the right upper quadrant. 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 IMPRESSION: Stable cardiomegaly with aortic atherosclerosis. Interval clearing of pulmonary edema. No new alveolar consolidations are noted. Electronically Signed   By: Ashley Royalty M.D.   On: 04/17/2018 22:43   Ct Chest Wo Contrast  Result Date: 04/03/2018 CLINICAL DATA:  Followup for presumed cryptogenic organizing pneumonia. EXAM: CT CHEST WITHOUT CONTRAST TECHNIQUE: Multidetector CT imaging of the chest was performed following the standard protocol without IV contrast. COMPARISON:  Current chest radiograph. Multiple prior chest radiographs. Chest CT 02/17/2018. FINDINGS: Cardiovascular: Heart is mildly enlarged. No pericardial effusion. No coronary artery calcifications. Great vessels normal in caliber. Mild aortic atherosclerosis. Mediastinum/Nodes: No neck base or axillary masses or enlarged lymph nodes. Normal thyroid. There are prominent mediastinal nodes, largest an AP window node measuring 13 mm in short axis, without significant change from the prior CT. Trachea is widely patent. Esophagus is unremarkable. Lungs/Pleura: Small, right greater than left, pleural effusions. Bilateral peribronchovascular ground-glass opacities are noted in all lobes. Mild bronchiectasis and centrilobular emphysema is noted in both lobes. The more extensive airspace opacities noted on the prior study have improved. No new lung abnormalities. No pneumothorax. Upper Abdomen: No acute abnormality. Musculoskeletal: No fracture or acute finding. No osteoblastic or osteolytic lesions.  IMPRESSION: 1. There has been significant improvement since the prior chest CT. The bilateral areas of ground-glass lung opacification have improved. Underlying mild bronchiectasis is stable. The findings have upper lung predominance. No new lung abnormalities. 2. Small, right greater than left, pleural effusions have increased in size from prior CT. 3. Findings described may reflect improved pulmonary edema or could be due to improved cryptogenic organizing pneumonia or other interstitial lung disease. Aortic Atherosclerosis (ICD10-I70.0). Electronically Signed   By: Lajean Manes M.D.   On: 04/03/2018 12:40   Ct Abdomen Pelvis W Contrast  Result Date: 04/19/2018 CLINICAL DATA:  Acute onset of right upper quadrant abdominal pain. Elevated bilirubin and alkaline phosphatase. EXAM: CT ABDOMEN AND PELVIS WITH CONTRAST TECHNIQUE: Multidetector CT imaging of the abdomen and pelvis was performed using the standard protocol following bolus administration of intravenous contrast. CONTRAST:  118mL OMNIPAQUE IOHEXOL 300 MG/ML  SOLN COMPARISON:  CT of the abdomen and pelvis from 04/17/2018 FINDINGS: Lower chest: There is new consolidation of the right lower lobe, compatible with pneumonia. Mild left basilar atelectasis is noted. The visualized portions of the mediastinum are  unremarkable. Hepatobiliary: Mild prominence of the intrahepatic biliary ducts appears relatively stable and likely reflects prior cholecystectomy. The common bile duct is normal in caliber status post cholecystectomy. Clips are noted at the gallbladder fossa. The liver is otherwise unremarkable in appearance. Pancreas: The pancreas is within normal limits. Spleen: A nonspecific 1.4 cm hypodensity is noted within the spleen; a smaller better defined 8 mm cyst is also seen. Adrenals/Urinary Tract: The adrenal glands are unremarkable in appearance. The kidneys are within normal limits. There is no evidence of hydronephrosis. No renal or ureteral stones  are identified. Mild nonspecific left-sided perinephric stranding is seen. Stomach/Bowel: The stomach is unremarkable in appearance. The small bowel is within normal limits. The appendix is normal in caliber, without evidence of appendicitis. The colon is unremarkable in appearance. Vascular/Lymphatic: Scattered calcification is seen along the abdominal aorta and its branches. The abdominal aorta is otherwise grossly unremarkable. The inferior vena cava is grossly unremarkable. No retroperitoneal lymphadenopathy is seen. No pelvic sidewall lymphadenopathy is identified. Reproductive: The bladder is mildly distended and grossly unremarkable. The patient is status post hysterectomy. No suspicious adnexal masses are seen. Other: No additional soft tissue abnormalities are seen. Musculoskeletal: No acute osseous abnormalities are identified. The visualized musculature is unremarkable in appearance. IMPRESSION: 1. New consolidation of the right lower lung lobe, compatible with pneumonia. 2. Status post cholecystectomy. Biliary tree is grossly unremarkable in appearance. No focal abnormalities identified at the right upper quadrant at this time. 3. Nonspecific 1.4 cm hypodensity within the spleen; smaller better defined 8 mm cyst also seen. Aortic Atherosclerosis (ICD10-I70.0). Electronically Signed   By: Garald Balding M.D.   On: 04/19/2018 23:40   Dg Chest Port 1 View  Result Date: 04/12/2018 CLINICAL DATA:  Shortness of breath. End-stage renal disease on dialysis. EXAM: PORTABLE CHEST 1 VIEW COMPARISON:  04/03/2018 FINDINGS: Right jugular central venous dialysis catheter remains in appropriate position. Stable mild cardiomegaly. Diffuse mixed interstitial and airspace disease shows no significant change, suspicious for pulmonary edema. No evidence of superimposed pulmonary consolidation. IMPRESSION: Stable diffuse mixed interstitial and airspace disease, suspicious for diffuse pulmonary edema. Stable cardiomegaly.  Electronically Signed   By: Earle Gell M.D.   On: 04/12/2018 11:33   Dg Chest Port 1 View  Result Date: 04/03/2018 CLINICAL DATA:  Hypoxemia.  History of end-stage renal disease. EXAM: PORTABLE CHEST 1 VIEW COMPARISON:  04/02/2018 and older exams. FINDINGS: Since the previous day's study, interstitial opacities are similar. Hazy intervening airspace opacities have improved. Hemidiaphragms are better defined. Findings are consistent with improved pulmonary edema reduced small pleural effusions. No new lung abnormalities. No pneumothorax. Cardiac silhouette borderline enlarged. Right internal jugular dual-lumen central venous catheter is stable. IMPRESSION: 1. Improved pulmonary edema when compared to the previous day's study. No new abnormalities. Electronically Signed   By: Lajean Manes M.D.   On: 04/03/2018 09:12   Dg Chest Port 1 View  Result Date: 04/02/2018 CLINICAL DATA:  Shortness of breath. EXAM: PORTABLE CHEST 1 VIEW COMPARISON:  Radiographs of March 23, 2018. FINDINGS: Stable cardiomegaly. Atherosclerosis of thoracic aorta is noted. Stable diffuse reticular densities are noted throughout both lungs which may represent chronic interstitial lung disease or scarring, but acute superimposed edema or inflammation cannot be excluded. No pneumothorax or significant pleural effusion is noted. Right internal jugular dialysis catheter is unchanged in position. Bony thorax is unremarkable. IMPRESSION: Stable bilateral diffuse reticular lung opacities are noted which may represent chronic interstitial lung disease or scarring, but acute superimposed edema or inflammation  cannot be excluded. Electronically Signed   By: Marijo Conception, M.D.   On: 04/02/2018 14:56   Ct Renal Stone Study  Result Date: 04/17/2018 CLINICAL DATA:  62 y/o  F; flank pain. EXAM: CT ABDOMEN AND PELVIS WITHOUT CONTRAST TECHNIQUE: Multidetector CT imaging of the abdomen and pelvis was performed following the standard protocol without  IV contrast. COMPARISON:  03/19/2018 CT abdomen and pelvis. FINDINGS: Lower chest: Near complete resolution of ground-glass opacities in the lung bases. Hepatobiliary: No focal liver abnormality is seen. Status post cholecystectomy. No biliary dilatation. Pancreas: Unremarkable. No pancreatic ductal dilatation or surrounding inflammatory changes. Spleen: Normal in size without focal abnormality. Adrenals/Urinary Tract: Stable left perinephric fat stranding. Normal adrenal glands. No hydronephrosis or urinary stone disease. Normal bladder. Stomach/Bowel: Stomach is within normal limits. Appendix appears normal. No evidence of bowel wall thickening, distention, or inflammatory changes. Vascular/Lymphatic: Aortic atherosclerosis. No enlarged abdominal or pelvic lymph nodes. 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 Other: No abdominal wall hernia or abnormality. No abdominopelvic ascites. Musculoskeletal: No fracture is seen. Lumbar spondylosis with L4-5 grade 1 anterolisthesis and prominent lower lumbar facet arthrosis. IMPRESSION: 1. No acute process identified. No hydronephrosis or urinary stone disease. 2. Near complete resolution of ground-glass opacities at the lung bases. 3. Stable mild left perinephric fat stranding. 4. 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 Electronically Signed   By: Kristine Garbe M.D.   On: 04/17/2018 23:10     TODAY-DAY OF DISCHARGE:  Subjective:   Che[place] today has no headache,no chest abdominal pain,no new weakness tingling or numbness, feels much better wants to go home today.   Objective:   Blood pressure 113/73, pulse 97, temperature 98.5 F (36.9 C), temperature source Oral, resp. rate 16, height 5\' 1"  (1.549 m), weight 61.3 kg, SpO2 100 %.  Intake/Output Summary (Last 24 hours) at 04/25/2018 1037 Last data filed at 04/25/2018 0509 Gross per 24 hour  Intake 723.6 ml  Output 2000 ml  Net -1276.4 ml   Filed Weights   04/24/18 0500 04/24/18 1135  04/25/18 0620  Weight: 63.1 kg 59.4 kg 61.3 kg    Exam: Awake Alert, Oriented *3, No new F.N deficits, Normal affect Kim.AT,PERRAL Supple Neck,No JVD, No cervical lymphadenopathy appriciated.  Symmetrical Chest wall movement, Good air movement bilaterally, CTAB RRR,No Gallops,Rubs or new Murmurs, No Parasternal Heave +ve B.Sounds, Abd Soft, Non tender, No organomegaly appriciated, No rebound -guarding or rigidity. No Cyanosis, Clubbing or edema, No new Rash or bruise   PERTINENT RADIOLOGIC STUDIES: Dg Chest 2 View  Result Date: 04/17/2018 CLINICAL DATA:  Right-sided rib cage pain x1 day. EXAM: CHEST - 2 VIEW COMPARISON:  04/12/2018 FINDINGS: Cardiomegaly is noted. No alveolar consolidation is seen. Improved aeration of the lungs is identified with interval decrease in alveolar airspace opacities bilaterally. No new pulmonary consolidation nor overt pulmonary edema is visualized. No effusion or pneumothorax. Nonaneurysmal atherosclerotic aorta is noted. Right IJ dialysis catheter tip terminates at the cavoatrial junction, stable in appearance. Cholecystectomy clips are present in the right upper quadrant. 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 IMPRESSION: Stable cardiomegaly with aortic atherosclerosis. Interval clearing of pulmonary edema. No new alveolar consolidations are noted. Electronically Signed   By: Ashley Royalty M.D.   On: 04/17/2018 22:43   Ct Chest Wo Contrast  Result Date: 04/03/2018 CLINICAL DATA:  Followup for presumed cryptogenic organizing pneumonia. EXAM: CT CHEST WITHOUT CONTRAST TECHNIQUE: Multidetector CT imaging of the chest was performed following the standard protocol without IV contrast.  COMPARISON:  Current chest radiograph. Multiple prior chest radiographs. Chest CT 02/17/2018. FINDINGS: Cardiovascular: Heart is mildly enlarged. No pericardial effusion. No coronary artery calcifications. Great vessels normal in caliber. Mild aortic atherosclerosis.  Mediastinum/Nodes: No neck base or axillary masses or enlarged lymph nodes. Normal thyroid. There are prominent mediastinal nodes, largest an AP window node measuring 13 mm in short axis, without significant change from the prior CT. Trachea is widely patent. Esophagus is unremarkable. Lungs/Pleura: Small, right greater than left, pleural effusions. Bilateral peribronchovascular ground-glass opacities are noted in all lobes. Mild bronchiectasis and centrilobular emphysema is noted in both lobes. The more extensive airspace opacities noted on the prior study have improved. No new lung abnormalities. No pneumothorax. Upper Abdomen: No acute abnormality. Musculoskeletal: No fracture or acute finding. No osteoblastic or osteolytic lesions. IMPRESSION: 1. There has been significant improvement since the prior chest CT. The bilateral areas of ground-glass lung opacification have improved. Underlying mild bronchiectasis is stable. The findings have upper lung predominance. No new lung abnormalities. 2. Small, right greater than left, pleural effusions have increased in size from prior CT. 3. Findings described may reflect improved pulmonary edema or could be due to improved cryptogenic organizing pneumonia or other interstitial lung disease. Aortic Atherosclerosis (ICD10-I70.0). Electronically Signed   By: Lajean Manes M.D.   On: 04/03/2018 12:40   Ct Abdomen Pelvis W Contrast  Result Date: 04/19/2018 CLINICAL DATA:  Acute onset of right upper quadrant abdominal pain. Elevated bilirubin and alkaline phosphatase. EXAM: CT ABDOMEN AND PELVIS WITH CONTRAST TECHNIQUE: Multidetector CT imaging of the abdomen and pelvis was performed using the standard protocol following bolus administration of intravenous contrast. CONTRAST:  188mL OMNIPAQUE IOHEXOL 300 MG/ML  SOLN COMPARISON:  CT of the abdomen and pelvis from 04/17/2018 FINDINGS: Lower chest: There is new consolidation of the right lower lobe, compatible with pneumonia.  Mild left basilar atelectasis is noted. The visualized portions of the mediastinum are unremarkable. Hepatobiliary: Mild prominence of the intrahepatic biliary ducts appears relatively stable and likely reflects prior cholecystectomy. The common bile duct is normal in caliber status post cholecystectomy. Clips are noted at the gallbladder fossa. The liver is otherwise unremarkable in appearance. Pancreas: The pancreas is within normal limits. Spleen: A nonspecific 1.4 cm hypodensity is noted within the spleen; a smaller better defined 8 mm cyst is also seen. Adrenals/Urinary Tract: The adrenal glands are unremarkable in appearance. The kidneys are within normal limits. There is no evidence of hydronephrosis. No renal or ureteral stones are identified. Mild nonspecific left-sided perinephric stranding is seen. Stomach/Bowel: The stomach is unremarkable in appearance. The small bowel is within normal limits. The appendix is normal in caliber, without evidence of appendicitis. The colon is unremarkable in appearance. Vascular/Lymphatic: Scattered calcification is seen along the abdominal aorta and its branches. The abdominal aorta is otherwise grossly unremarkable. The inferior vena cava is grossly unremarkable. No retroperitoneal lymphadenopathy is seen. No pelvic sidewall lymphadenopathy is identified. Reproductive: The bladder is mildly distended and grossly unremarkable. The patient is status post hysterectomy. No suspicious adnexal masses are seen. Other: No additional soft tissue abnormalities are seen. Musculoskeletal: No acute osseous abnormalities are identified. The visualized musculature is unremarkable in appearance. IMPRESSION: 1. New consolidation of the right lower lung lobe, compatible with pneumonia. 2. Status post cholecystectomy. Biliary tree is grossly unremarkable in appearance. No focal abnormalities identified at the right upper quadrant at this time. 3. Nonspecific 1.4 cm hypodensity within the  spleen; smaller better defined 8 mm cyst also seen. Aortic  Atherosclerosis (ICD10-I70.0). Electronically Signed   By: Garald Balding M.D.   On: 04/19/2018 23:40   Dg Chest Port 1 View  Result Date: 04/12/2018 CLINICAL DATA:  Shortness of breath. End-stage renal disease on dialysis. EXAM: PORTABLE CHEST 1 VIEW COMPARISON:  04/03/2018 FINDINGS: Right jugular central venous dialysis catheter remains in appropriate position. Stable mild cardiomegaly. Diffuse mixed interstitial and airspace disease shows no significant change, suspicious for pulmonary edema. No evidence of superimposed pulmonary consolidation. IMPRESSION: Stable diffuse mixed interstitial and airspace disease, suspicious for diffuse pulmonary edema. Stable cardiomegaly. Electronically Signed   By: Earle Gell M.D.   On: 04/12/2018 11:33   Dg Chest Port 1 View  Result Date: 04/03/2018 CLINICAL DATA:  Hypoxemia.  History of end-stage renal disease. EXAM: PORTABLE CHEST 1 VIEW COMPARISON:  04/02/2018 and older exams. FINDINGS: Since the previous day's study, interstitial opacities are similar. Hazy intervening airspace opacities have improved. Hemidiaphragms are better defined. Findings are consistent with improved pulmonary edema reduced small pleural effusions. No new lung abnormalities. No pneumothorax. Cardiac silhouette borderline enlarged. Right internal jugular dual-lumen central venous catheter is stable. IMPRESSION: 1. Improved pulmonary edema when compared to the previous day's study. No new abnormalities. Electronically Signed   By: Lajean Manes M.D.   On: 04/03/2018 09:12   Dg Chest Port 1 View  Result Date: 04/02/2018 CLINICAL DATA:  Shortness of breath. EXAM: PORTABLE CHEST 1 VIEW COMPARISON:  Radiographs of March 23, 2018. FINDINGS: Stable cardiomegaly. Atherosclerosis of thoracic aorta is noted. Stable diffuse reticular densities are noted throughout both lungs which may represent chronic interstitial lung disease or scarring, but  acute superimposed edema or inflammation cannot be excluded. No pneumothorax or significant pleural effusion is noted. Right internal jugular dialysis catheter is unchanged in position. Bony thorax is unremarkable. IMPRESSION: Stable bilateral diffuse reticular lung opacities are noted which may represent chronic interstitial lung disease or scarring, but acute superimposed edema or inflammation cannot be excluded. Electronically Signed   By: Marijo Conception, M.D.   On: 04/02/2018 14:56   Ct Renal Stone Study  Result Date: 04/17/2018 CLINICAL DATA:  62 y/o  F; flank pain. EXAM: CT ABDOMEN AND PELVIS WITHOUT CONTRAST TECHNIQUE: Multidetector CT imaging of the abdomen and pelvis was performed following the standard protocol without IV contrast. COMPARISON:  03/19/2018 CT abdomen and pelvis. FINDINGS: Lower chest: Near complete resolution of ground-glass opacities in the lung bases. Hepatobiliary: No focal liver abnormality is seen. Status post cholecystectomy. No biliary dilatation. Pancreas: Unremarkable. No pancreatic ductal dilatation or surrounding inflammatory changes. Spleen: Normal in size without focal abnormality. Adrenals/Urinary Tract: Stable left perinephric fat stranding. Normal adrenal glands. No hydronephrosis or urinary stone disease. Normal bladder. Stomach/Bowel: Stomach is within normal limits. Appendix appears normal. No evidence of bowel wall thickening, distention, or inflammatory changes. Vascular/Lymphatic: Aortic atherosclerosis. No enlarged abdominal or pelvic lymph nodes. 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 Other: No abdominal wall hernia or abnormality. No abdominopelvic ascites. Musculoskeletal: No fracture is seen. Lumbar spondylosis with L4-5 grade 1 anterolisthesis and prominent lower lumbar facet arthrosis. IMPRESSION: 1. No acute process identified. No hydronephrosis or urinary stone disease. 2. Near complete resolution of ground-glass opacities at the lung bases. 3.  Stable mild left perinephric fat stranding. 4. 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 Electronically Signed   By: Kristine Garbe M.D.   On: 04/17/2018 23:10     PERTINENT LAB RESULTS: CBC: Recent Labs    04/24/18 0621  WBC 4.1  HGB 8.2*  HCT  26.5*  PLT 150   CMET CMP     Component Value Date/Time   NA 138 04/24/2018 0621   NA 135 03/25/2018 1014   K 3.9 04/24/2018 0621   CL 103 04/24/2018 0621   CO2 25 04/24/2018 0621   GLUCOSE 121 (H) 04/24/2018 0621   BUN 34 (H) 04/24/2018 0621   BUN 96 (HH) 03/25/2018 1014   CREATININE 5.88 (H) 04/24/2018 0621   CALCIUM 7.7 (L) 04/24/2018 0621   PROT 4.9 (L) 04/20/2018 0547   ALBUMIN 1.5 (L) 04/24/2018 0621   AST 25 04/20/2018 0547   ALT 25 04/20/2018 0547   ALKPHOS 161 (H) 04/20/2018 0547   BILITOT 0.9 04/20/2018 0547   GFRNONAA 7 (L) 04/24/2018 0621   GFRAA 8 (L) 04/24/2018 0621    GFR Estimated Creatinine Clearance: 8.1 mL/min (A) (by C-G formula based on SCr of 5.88 mg/dL (H)). No results for input(s): LIPASE, AMYLASE in the last 72 hours. No results for input(s): CKTOTAL, CKMB, CKMBINDEX, TROPONINI in the last 72 hours. Invalid input(s): POCBNP No results for input(s): DDIMER in the last 72 hours. No results for input(s): HGBA1C in the last 72 hours. No results for input(s): CHOL, HDL, LDLCALC, TRIG, CHOLHDL, LDLDIRECT in the last 72 hours. No results for input(s): TSH, T4TOTAL, T3FREE, THYROIDAB in the last 72 hours.  Invalid input(s): FREET3 No results for input(s): VITAMINB12, FOLATE, FERRITIN, TIBC, IRON, RETICCTPCT in the last 72 hours. Coags: No results for input(s): INR in the last 72 hours.  Invalid input(s): PT Microbiology: Recent Results (from the past 240 hour(s))  Culture, blood (routine x 2)     Status: None   Collection Time: 04/18/18  5:25 AM  Result Value Ref Range Status   Specimen Description BLOOD RIGHT ANTECUBITAL  Final   Special Requests   Final    BOTTLES DRAWN AEROBIC ONLY Blood  Culture results may not be optimal due to an inadequate volume of blood received in culture bottles   Culture   Final    NO GROWTH 5 DAYS Performed at [facility], [place] [address] [place], [place] [address]    Report Status 04/23/2018 FINAL  Final  Culture, blood (routine x 2)     Status: Abnormal   Collection Time: 04/18/18  5:30 AM  Result Value Ref Range Status   Specimen Description BLOOD RIGHT HAND  Final   Special Requests   Final    BOTTLES DRAWN AEROBIC ONLY Blood Culture results may not be optimal due to an inadequate volume of blood received in culture bottles   Culture  Setup Time   Final    GRAM POSITIVE COCCI IN CLUSTERS AEROBIC BOTTLE ONLY CRITICAL RESULT CALLED TO, READ BACK BY AND VERIFIED WITH: M. PHAM, RPHARMD AT 1735 ON 04/19/18 BY C. JESSUP, MLT.    Culture (A)  Final    MICROCOCCUS SPECIES Standardized susceptibility testing for this organism is not available. Performed at [facility], [place] [address], [place], [place] [address]    Report Status 04/20/2018 FINAL  Final  Blood Culture ID Panel (Reflexed)     Status: None   Collection Time: 04/18/18  5:30 AM  Result Value Ref Range Status   Enterococcus species NOT DETECTED NOT DETECTED Final   Listeria monocytogenes NOT DETECTED NOT DETECTED Final   Staphylococcus species NOT DETECTED NOT DETECTED Final   Staphylococcus aureus NOT DETECTED NOT DETECTED Final   Streptococcus species NOT DETECTED NOT DETECTED Final   Streptococcus agalactiae NOT  DETECTED NOT DETECTED Final   Streptococcus pneumoniae NOT DETECTED NOT DETECTED Final   Streptococcus pyogenes NOT DETECTED NOT DETECTED Final   Acinetobacter baumannii NOT DETECTED NOT DETECTED Final   Enterobacteriaceae species NOT DETECTED NOT DETECTED Final   Enterobacter cloacae complex NOT DETECTED NOT DETECTED Final   Escherichia coli NOT DETECTED NOT DETECTED Final   Klebsiella oxytoca NOT DETECTED NOT DETECTED Final   Klebsiella pneumoniae NOT  DETECTED NOT DETECTED Final   Proteus species NOT DETECTED NOT DETECTED Final   Serratia marcescens NOT DETECTED NOT DETECTED Final   Haemophilus influenzae NOT DETECTED NOT DETECTED Final   Neisseria meningitidis NOT DETECTED NOT DETECTED Final   Pseudomonas aeruginosa NOT DETECTED NOT DETECTED Final   Candida albicans NOT DETECTED NOT DETECTED Final   Candida glabrata NOT DETECTED NOT DETECTED Final   Candida krusei NOT DETECTED NOT DETECTED Final   Candida parapsilosis NOT DETECTED NOT DETECTED Final   Candida tropicalis NOT DETECTED NOT DETECTED Final    Comment: Performed at [facility], [place] [address], [place], [place] [address]  Culture, Urine     Status: Abnormal   Collection Time: 04/18/18  8:42 AM  Result Value Ref Range Status   Specimen Description URINE, CLEAN CATCH  Final   Special Requests   Final    NONE Performed at [facility], [place] [address] [place], [place] [address]    Culture MULTIPLE SPECIES PRESENT, SUGGEST RECOLLECTION (A)  Final   Report Status 04/19/2018 FINAL  Final  Culture, blood (routine x 2)     Status: None (Preliminary result)   Collection Time: 04/23/18  5:00 PM  Result Value Ref Range Status   Specimen Description BLOOD RIGHT ANTECUBITAL  Final   Special Requests   Final    BOTTLES DRAWN AEROBIC AND ANAEROBIC Blood Culture adequate volume   Culture   Final    NO GROWTH < 24 HOURS Performed at [facility], [place] [address], [place], [place] [address]    Report Status PENDING  Incomplete  Culture, blood (routine x 2)     Status: None (Preliminary result)   Collection Time: 04/23/18  5:15 PM  Result Value Ref Range Status   Specimen Description BLOOD RIGHT FOREARM  Final   Special Requests   Final    BOTTLES DRAWN AEROBIC AND ANAEROBIC Blood Culture adequate volume   Culture   Final    NO GROWTH < 24 HOURS Performed at [facility], [place] [address], [place], [place] [address]    Report Status PENDING  Incomplete     FURTHER DISCHARGE INSTRUCTIONS:  Get Medicines reviewed and adjusted: Please take all your medications with you for your next visit with your Primary MD  Laboratory/radiological data: Please request your Primary MD to go over all hospital tests and procedure/radiological results at the follow up, please ask your Primary MD to get all Hospital records sent to his/her office.  In some cases, they will be blood work, cultures and biopsy results pending at the time of your discharge. Please request that your primary care M.D. goes through all the records of your hospital data and follows up on these results.  Also Note the following: If you experience worsening of your admission symptoms, develop shortness of breath, life threatening emergency, suicidal or homicidal thoughts you must seek medical attention immediately by calling 911 or calling your MD immediately  if symptoms less severe.  You must read complete instructions/literature along with all  the possible adverse reactions/side effects for all the Medicines you take and that have been prescribed to you. Take any new Medicines after you have completely understood and accpet all the possible adverse reactions/side effects.   Do not drive when taking Pain medications or sleeping medications (Benzodaizepines)  Do not take more than prescribed Pain, Sleep and Anxiety Medications. It is not advisable to combine anxiety,sleep and pain medications without talking with your primary care practitioner  Special Instructions: If you have smoked or chewed Tobacco  in the last 2 yrs please stop smoking, stop any regular Alcohol  and or any Recreational drug use.  Wear Seat belts while driving.  Please note: You were cared for by a hospitalist during your hospital stay. Once you are discharged, your primary care physician will handle any further medical issues. Please note that NO REFILLS for any discharge medications will be authorized once you  are discharged, as it is imperative that you return to your primary care physician (or establish a relationship with a primary care physician if you do not have one) for your post hospital discharge needs so that they can reassess your need for medications and monitor your lab values.  Total Time spent coordinating discharge including counseling, education and face to face time equals 45 minutes.  SignedOren Binet 04/25/2018 10:37 AM

## 2018-04-24 NOTE — Procedures (Signed)
Tol HD treatment, clinically improving. No hemodynamic instability. Can get IV antibiotics(Ceftriaxone) at dialysis as OP if needed Erling Cruz, MD

## 2018-04-24 NOTE — Progress Notes (Signed)
Pharmacy Antibiotic Note  Rhonda Lynch is a 62 y.o. female admitted on 04/17/2018 with right flank pain.  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  She has ESRD on TTS HD.  Afebrile, WBC WNL.   Plan: -Anticipate deescalation of abx shortly, if not, will have to obtain VR -Discussed Augmentin with MD    Height: 5\' 1"  (154.9 cm) Weight: 130 lb 15.3 oz (59.4 kg) IBW/kg (Calculated) : 47.8  Temp (24hrs), Avg:98.7 F (37.1 C), Min:98.4 F (36.9 C), Max:99 F (37.2 C)  Recent Labs  Lab 04/18/18 0529 04/19/18 0830 04/20/18 0547 04/21/18 0444 04/22/18 0810 04/22/18 0815 04/24/18 0621  WBC 5.2 7.9 10.5  --  6.1  --  4.1  CREATININE 3.19* 4.62* 3.14* 4.52*  --  6.10* 5.88*      Vanc 8/11 >> Zosyn 8/11 >> CTX 8/9 >> 8/11 Flagyl 8/10 >> 8/11    Harvel Quale 04/24/2018 12:59 PM

## 2018-04-24 NOTE — Care Management Note (Signed)
Case Management Note Original Note by: Marilu Favre, RN 04/18/2018, 10:33 AM   Patient Details  Name: Rhonda Lynch MRN: [id number] Date of Birth: 1955/10/09  Subjective/Objective:                    Action/Plan: Provided and explained [place] letter.   Patient states her Med[employer] and Medicare are both pending.   Patient has a follow up appointment at [facility] 05/13/18 at 910 am.  Patient voiced understanding to all of above.   Expected Discharge Date:  04/24/18               Expected Discharge Plan:  [place]  In-House Referral:  Financial Counselor  Discharge planning Services  CM Consult, [place] Program, Medication Assistance  Post Acute Care Choice:  Resumption of Svcs/PTA Provider Choice offered to:  Patient  DME Arranged:  N/A DME Agency:  NA  HH Arranged:  PT, Social Work [employer] Agency:  [place]  Status of Service:  Completed, signed off  If discussed at [employer], dates discussed:    Additional Comments:  04/24/18 J. Laiken Sandy, RN, BSN Pt medically stable for dc home today.  Resumption orders received for HHPT and CSW to continue at home with [facility] as prior to admission.  Pt has [place] letter, and follow up appointment with PCP at [facility].    Reinaldo Raddle, RN, BSN  Trauma/Neuro ICU Case Manager [phone]

## 2018-04-24 NOTE — Discharge Summary (Signed)
Physician Discharge Summary  Rhonda Lynch UUV:[id number] DOB: 05-24-1956 DOA: 04/12/2018  PCP: Clent Demark, PA-C  Admit date: 04/12/2018 Discharge date: 04/24/2018  Time spent: 15   Recommendations for Outpatient Follow-up:  Follow for dialysis usual schedule Tuesday Thursday Saturday via right IJ TDC minutes Consider outpatient titration of antihypertensives Will need follow-up for her vasculitis Has been on Bactrim and prednisone chronically-may need this followed  Discharge Diagnoses:  Principal Problem:   Acute respiratory failure with hypoxia ([place]) Active Problems:   TOBACCO ABUSE   ESRD (end stage renal disease) ([place])   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[place])   Elevated troponin   Acute pulmonary edema ([place])   Hypertension   Diabetes ([place])   Noncompliance of patient with renal dialysis [facility])   Discharge Condition: Guarded  Diet recommendation: Heart healthy diabetic  Filed Weights   04/12/18 1840 04/13/18 0311  Weight: 63 kg 63.3 kg    History of present illness:  62 year old.fem  Dialysis TTS via RIJ TDC diabetes mellitus chronic thrombocytopenia history of polysubstance abuse cocaine, EtOH) admitted for volume overload Previously has been on Cytoxan/plasmapheresis for ANCA vasculitis-ILD--- has a history of truncated dialysis treatments Initially placed on BiPAP in the ED secondary to pulmonary edema-resolved quickly during hospital stay-emergently dialyzed-/3 PM and then once again on 8/4 and cleared by nephrology to discharge home has had a output of 3.2 L dialysis Rest of her hospital issues were stable during hospitalization  Consultations:  Renal  Discharge Exam: Vitals:   04/13/18 0715 04/13/18 1137  BP: (!) 157/96 (!) 161/108  Pulse: (!) 104 81  Resp: 18 20  Temp: 98.4 F (36.9 C) 98.8 F (37.1 C)  SpO2: 95% 98%    General: Wake alert pleasant oriented no distress Cardiovascular: S1-S2 no murmur rub or  gallop Respiratory: Clinically clear no added sound Neurologically grossly intact  Discharge Instructions   Discharge Instructions    Diet - low sodium heart healthy   Complete by:  As directed    Increase activity slowly   Complete by:  As directed      Allergies as of 04/13/2018   No Known Allergies     Medication List    STOP taking these medications   amoxicillin-clavulanate 500-125 MG tablet Commonly known as:  AUGMENTIN   docusate sodium 50 MG capsule Commonly known as:  COLACE   famotidine 20 MG tablet Commonly known as:  PEPCID   Insulin Glargine 100 UNIT/ML Solostar Pen Commonly known as:  LANTUS Replaced by:  insulin glargine 100 UNIT/ML injection   Insulin Pen Needle 31G X 5 MM Misc   multivitamin with minerals Tabs tablet   predniSONE 20 MG tablet Commonly known as:  DELTASONE   sulfamethoxazole-trimethoprim 800-160 MG tablet Commonly known as:  BACTRIM DS,SEPTRA DS   thiamine 100 MG tablet     TAKE these medications   benzonatate 100 MG capsule Commonly known as:  TESSALON Take 1 capsule (100 mg total) by mouth every 8 (eight) hours.   calcitRIOL 0.25 MCG capsule Commonly known as:  ROCALTROL Take 1 capsule (0.25 mcg total) by mouth every other day.   insulin aspart 100 UNIT/ML injection Commonly known as:  novoLOG Inject 0-9 Units into the skin 3 (three) times daily with meals.   insulin aspart 100 UNIT/ML injection Commonly known as:  novoLOG Inject 0-5 Units into the skin at bedtime.   insulin glargine 100 UNIT/ML injection Commonly known as:  LANTUS Inject 0.05 mLs (5 Units total) into  the skin at bedtime. Replaces:  Insulin Glargine 100 UNIT/ML Solostar Pen   multivitamin Tabs tablet Take 1 tablet by mouth at bedtime.   ondansetron 4 MG tablet Commonly known as:  ZOFRAN Take 1 tablet (4 mg total) by mouth every 6 (six) hours as needed for nausea.   traMADol 50 MG tablet Commonly known as:  ULTRAM Take 1 tablet (50 mg total)  by mouth every 12 (twelve) hours as needed (refractory pain).   zolpidem 5 MG tablet Commonly known as:  AMBIEN Take 1 tablet (5 mg total) by mouth at bedtime as needed for sleep.      No Known Allergies    The results of significant diagnostics from this hospitalization (including imaging, microbiology, ancillary and laboratory) are listed below for reference.    Significant Diagnostic Studies: Dg Chest 2 View  Result Date: 04/17/2018 CLINICAL DATA:  Right-sided rib cage pain x1 day. EXAM: CHEST - 2 VIEW COMPARISON:  04/12/2018 FINDINGS: Cardiomegaly is noted. No alveolar consolidation is seen. Improved aeration of the lungs is identified with interval decrease in alveolar airspace opacities bilaterally. No new pulmonary consolidation nor overt pulmonary edema is visualized. No effusion or pneumothorax. Nonaneurysmal atherosclerotic aorta is noted. Right IJ dialysis catheter tip terminates at the cavoatrial junction, stable in appearance. Cholecystectomy clips are present in the right upper quadrant. 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 IMPRESSION: Stable cardiomegaly with aortic atherosclerosis. Interval clearing of pulmonary edema. No new alveolar consolidations are noted. Electronically Signed   By: Ashley Royalty M.D.   On: 04/17/2018 22:43   Ct Chest Wo Contrast  Result Date: 04/03/2018 CLINICAL DATA:  Followup for presumed cryptogenic organizing pneumonia. EXAM: CT CHEST WITHOUT CONTRAST TECHNIQUE: Multidetector CT imaging of the chest was performed following the standard protocol without IV contrast. COMPARISON:  Current chest radiograph. Multiple prior chest radiographs. Chest CT 02/17/2018. FINDINGS: Cardiovascular: Heart is mildly enlarged. No pericardial effusion. No coronary artery calcifications. Great vessels normal in caliber. Mild aortic atherosclerosis. Mediastinum/Nodes: No neck base or axillary masses or enlarged lymph nodes. Normal thyroid. There are prominent  mediastinal nodes, largest an AP window node measuring 13 mm in short axis, without significant change from the prior CT. Trachea is widely patent. Esophagus is unremarkable. Lungs/Pleura: Small, right greater than left, pleural effusions. Bilateral peribronchovascular ground-glass opacities are noted in all lobes. Mild bronchiectasis and centrilobular emphysema is noted in both lobes. The more extensive airspace opacities noted on the prior study have improved. No new lung abnormalities. No pneumothorax. Upper Abdomen: No acute abnormality. Musculoskeletal: No fracture or acute finding. No osteoblastic or osteolytic lesions. IMPRESSION: 1. There has been significant improvement since the prior chest CT. The bilateral areas of ground-glass lung opacification have improved. Underlying mild bronchiectasis is stable. The findings have upper lung predominance. No new lung abnormalities. 2. Small, right greater than left, pleural effusions have increased in size from prior CT. 3. Findings described may reflect improved pulmonary edema or could be due to improved cryptogenic organizing pneumonia or other interstitial lung disease. Aortic Atherosclerosis (ICD10-I70.0). Electronically Signed   By: Lajean Manes M.D.   On: 04/03/2018 12:40   Ct Abdomen Pelvis W Contrast  Result Date: 04/19/2018 CLINICAL DATA:  Acute onset of right upper quadrant abdominal pain. Elevated bilirubin and alkaline phosphatase. EXAM: CT ABDOMEN AND PELVIS WITH CONTRAST TECHNIQUE: Multidetector CT imaging of the abdomen and pelvis was performed using the standard protocol following bolus administration of intravenous contrast. CONTRAST:  168mL OMNIPAQUE IOHEXOL 300 MG/ML  SOLN  COMPARISON:  CT of the abdomen and pelvis from 04/17/2018 FINDINGS: Lower chest: There is new consolidation of the right lower lobe, compatible with pneumonia. Mild left basilar atelectasis is noted. The visualized portions of the mediastinum are unremarkable.  Hepatobiliary: Mild prominence of the intrahepatic biliary ducts appears relatively stable and likely reflects prior cholecystectomy. The common bile duct is normal in caliber status post cholecystectomy. Clips are noted at the gallbladder fossa. The liver is otherwise unremarkable in appearance. Pancreas: The pancreas is within normal limits. Spleen: A nonspecific 1.4 cm hypodensity is noted within the spleen; a smaller better defined 8 mm cyst is also seen. Adrenals/Urinary Tract: The adrenal glands are unremarkable in appearance. The kidneys are within normal limits. There is no evidence of hydronephrosis. No renal or ureteral stones are identified. Mild nonspecific left-sided perinephric stranding is seen. Stomach/Bowel: The stomach is unremarkable in appearance. The small bowel is within normal limits. The appendix is normal in caliber, without evidence of appendicitis. The colon is unremarkable in appearance. Vascular/Lymphatic: Scattered calcification is seen along the abdominal aorta and its branches. The abdominal aorta is otherwise grossly unremarkable. The inferior vena cava is grossly unremarkable. No retroperitoneal lymphadenopathy is seen. No pelvic sidewall lymphadenopathy is identified. Reproductive: The bladder is mildly distended and grossly unremarkable. The patient is status post hysterectomy. No suspicious adnexal masses are seen. Other: No additional soft tissue abnormalities are seen. Musculoskeletal: No acute osseous abnormalities are identified. The visualized musculature is unremarkable in appearance. IMPRESSION: 1. New consolidation of the right lower lung lobe, compatible with pneumonia. 2. Status post cholecystectomy. Biliary tree is grossly unremarkable in appearance. No focal abnormalities identified at the right upper quadrant at this time. 3. Nonspecific 1.4 cm hypodensity within the spleen; smaller better defined 8 mm cyst also seen. Aortic Atherosclerosis (ICD10-I70.0).  Electronically Signed   By: Garald Balding M.D.   On: 04/19/2018 23:40   Dg Chest Port 1 View  Result Date: 04/12/2018 CLINICAL DATA:  Shortness of breath. End-stage renal disease on dialysis. EXAM: PORTABLE CHEST 1 VIEW COMPARISON:  04/03/2018 FINDINGS: Right jugular central venous dialysis catheter remains in appropriate position. Stable mild cardiomegaly. Diffuse mixed interstitial and airspace disease shows no significant change, suspicious for pulmonary edema. No evidence of superimposed pulmonary consolidation. IMPRESSION: Stable diffuse mixed interstitial and airspace disease, suspicious for diffuse pulmonary edema. Stable cardiomegaly. Electronically Signed   By: Earle Gell M.D.   On: 04/12/2018 11:33   Dg Chest Port 1 View  Result Date: 04/03/2018 CLINICAL DATA:  Hypoxemia.  History of end-stage renal disease. EXAM: PORTABLE CHEST 1 VIEW COMPARISON:  04/02/2018 and older exams. FINDINGS: Since the previous day's study, interstitial opacities are similar. Hazy intervening airspace opacities have improved. Hemidiaphragms are better defined. Findings are consistent with improved pulmonary edema reduced small pleural effusions. No new lung abnormalities. No pneumothorax. Cardiac silhouette borderline enlarged. Right internal jugular dual-lumen central venous catheter is stable. IMPRESSION: 1. Improved pulmonary edema when compared to the previous day's study. No new abnormalities. Electronically Signed   By: Lajean Manes M.D.   On: 04/03/2018 09:12   Dg Chest Port 1 View  Result Date: 04/02/2018 CLINICAL DATA:  Shortness of breath. EXAM: PORTABLE CHEST 1 VIEW COMPARISON:  Radiographs of March 23, 2018. FINDINGS: Stable cardiomegaly. Atherosclerosis of thoracic aorta is noted. Stable diffuse reticular densities are noted throughout both lungs which may represent chronic interstitial lung disease or scarring, but acute superimposed edema or inflammation cannot be excluded. No pneumothorax or  significant pleural effusion  is noted. Right internal jugular dialysis catheter is unchanged in position. Bony thorax is unremarkable. IMPRESSION: Stable bilateral diffuse reticular lung opacities are noted which may represent chronic interstitial lung disease or scarring, but acute superimposed edema or inflammation cannot be excluded. Electronically Signed   By: Marijo Conception, M.D.   On: 04/02/2018 14:56   Ct Renal Stone Study  Result Date: 04/17/2018 CLINICAL DATA:  62 y/o  F; flank pain. EXAM: CT ABDOMEN AND PELVIS WITHOUT CONTRAST TECHNIQUE: Multidetector CT imaging of the abdomen and pelvis was performed following the standard protocol without IV contrast. COMPARISON:  03/19/2018 CT abdomen and pelvis. FINDINGS: Lower chest: Near complete resolution of ground-glass opacities in the lung bases. Hepatobiliary: No focal liver abnormality is seen. Status post cholecystectomy. No biliary dilatation. Pancreas: Unremarkable. No pancreatic ductal dilatation or surrounding inflammatory changes. Spleen: Normal in size without focal abnormality. Adrenals/Urinary Tract: Stable left perinephric fat stranding. Normal adrenal glands. No hydronephrosis or urinary stone disease. Normal bladder. Stomach/Bowel: Stomach is within normal limits. Appendix appears normal. No evidence of bowel wall thickening, distention, or inflammatory changes. Vascular/Lymphatic: Aortic atherosclerosis. No enlarged abdominal or pelvic lymph nodes. 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 Other: No abdominal wall hernia or abnormality. No abdominopelvic ascites. Musculoskeletal: No fracture is seen. Lumbar spondylosis with L4-5 grade 1 anterolisthesis and prominent lower lumbar facet arthrosis. IMPRESSION: 1. No acute process identified. No hydronephrosis or urinary stone disease. 2. Near complete resolution of ground-glass opacities at the lung bases. 3. Stable mild left perinephric fat stranding. 4. 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  Electronically Signed   By: Kristine Garbe M.D.   On: 04/17/2018 23:10    Microbiology: Recent Results (from the past 240 hour(s))  Culture, blood (routine x 2)     Status: None   Collection Time: 04/18/18  5:25 AM  Result Value Ref Range Status   Specimen Description BLOOD RIGHT ANTECUBITAL  Final   Special Requests   Final    BOTTLES DRAWN AEROBIC ONLY Blood Culture results may not be optimal due to an inadequate volume of blood received in culture bottles   Culture   Final    NO GROWTH 5 DAYS Performed at [facility], [place] [address], [place], [place] [address]    Report Status 04/23/2018 FINAL  Final  Culture, blood (routine x 2)     Status: Abnormal   Collection Time: 04/18/18  5:30 AM  Result Value Ref Range Status   Specimen Description BLOOD RIGHT HAND  Final   Special Requests   Final    BOTTLES DRAWN AEROBIC ONLY Blood Culture results may not be optimal due to an inadequate volume of blood received in culture bottles   Culture  Setup Time   Final    GRAM POSITIVE COCCI IN CLUSTERS AEROBIC BOTTLE ONLY CRITICAL RESULT CALLED TO, READ BACK BY AND VERIFIED WITH: M. PHAM, RPHARMD AT 1735 ON 04/19/18 BY C. JESSUP, MLT.    Culture (A)  Final    MICROCOCCUS SPECIES Standardized susceptibility testing for this organism is not available. Performed at [facility], [place] [address] [place], [place] [address]    Report Status 04/20/2018 FINAL  Final  Blood Culture ID Panel (Reflexed)     Status: None   Collection Time: 04/18/18  5:30 AM  Result Value Ref Range Status   Enterococcus species NOT DETECTED NOT DETECTED Final   Listeria monocytogenes NOT DETECTED NOT DETECTED Final   Staphylococcus species NOT DETECTED NOT  DETECTED Final   Staphylococcus aureus NOT DETECTED NOT DETECTED Final   Streptococcus species NOT DETECTED NOT DETECTED Final   Streptococcus agalactiae NOT DETECTED NOT DETECTED Final   Streptococcus pneumoniae NOT DETECTED NOT DETECTED  Final   Streptococcus pyogenes NOT DETECTED NOT DETECTED Final   Acinetobacter baumannii NOT DETECTED NOT DETECTED Final   Enterobacteriaceae species NOT DETECTED NOT DETECTED Final   Enterobacter cloacae complex NOT DETECTED NOT DETECTED Final   Escherichia coli NOT DETECTED NOT DETECTED Final   Klebsiella oxytoca NOT DETECTED NOT DETECTED Final   Klebsiella pneumoniae NOT DETECTED NOT DETECTED Final   Proteus species NOT DETECTED NOT DETECTED Final   Serratia marcescens NOT DETECTED NOT DETECTED Final   Haemophilus influenzae NOT DETECTED NOT DETECTED Final   Neisseria meningitidis NOT DETECTED NOT DETECTED Final   Pseudomonas aeruginosa NOT DETECTED NOT DETECTED Final   Candida albicans NOT DETECTED NOT DETECTED Final   Candida glabrata NOT DETECTED NOT DETECTED Final   Candida krusei NOT DETECTED NOT DETECTED Final   Candida parapsilosis NOT DETECTED NOT DETECTED Final   Candida tropicalis NOT DETECTED NOT DETECTED Final    Comment: Performed at [facility], [place] [address], [place], [place] [address]  Culture, Urine     Status: Abnormal   Collection Time: 04/18/18  8:42 AM  Result Value Ref Range Status   Specimen Description URINE, CLEAN CATCH  Final   Special Requests   Final    NONE Performed at [facility], [place] [address], [place], [place] [address]    Culture MULTIPLE SPECIES PRESENT, SUGGEST RECOLLECTION (A)  Final   Report Status 04/19/2018 FINAL  Final  Culture, blood (routine x 2)     Status: None (Preliminary result)   Collection Time: 04/23/18  5:15 PM  Result Value Ref Range Status   Specimen Description BLOOD RIGHT FOREARM  Final   Special Requests   Final    BOTTLES DRAWN AEROBIC AND ANAEROBIC Blood Culture adequate volume   Culture PENDING  Incomplete   Report Status PENDING  Incomplete     Labs: Basic Metabolic Panel: Recent Labs  Lab 04/19/18 0830 04/20/18 0547 04/21/18 0444 04/22/18 0815 04/24/18 0621  NA 136 133* 134* 135 138  K  3.5 3.8 4.0 4.1 3.9  CL 95* 96* 96* 98 103  CO2 28 23 23 26 25   GLUCOSE 223* 111* 148* 133* 121*  BUN 26* 18 39* 51* 34*  CREATININE 4.62* 3.14* 4.52* 6.10* 5.88*  CALCIUM 7.9* 7.7* 7.8* 7.5* 7.7*  PHOS 5.3*  --   --  6.3* 5.2*   Liver Function Tests: Recent Labs  Lab 04/17/18 2147 04/18/18 0529 04/19/18 0830 04/20/18 0547 04/22/18 0815 04/24/18 0621  AST 51* 90*  --  25  --   --   ALT 26 41  --  25  --   --   ALKPHOS 73 231*  --  161*  --   --   BILITOT 0.6 1.4*  --  0.9  --   --   PROT 4.9* 4.8*  --  4.9*  --   --   ALBUMIN 2.5* 2.3* 2.2* 2.0* 1.6* 1.5*   Recent Labs  Lab 04/17/18 2147  LIPASE 37   No results for input(s): AMMONIA in the last 168 hours. CBC: Recent Labs  Lab 04/17/18 2147  04/18/18 0529 04/19/18 0830 04/20/18 0547 04/22/18 0810 04/24/18 0621  WBC 5.4  --  5.2 7.9 10.5 6.1 4.1  NEUTROABS 3.9  --  3.6  --   --   --   --   HGB 10.0*   < > 9.6* 9.7* 9.9* 8.0* 8.2*  HCT 32.8*   < > 30.9* 31.7* 31.4* 25.5* 26.5*  MCV 86.3  --  84.9 85.0 83.5 82.0 80.8  PLT 66*  --  63* 64* 66* 102* 150   < > = values in this interval not displayed.   Cardiac Enzymes: Recent Labs  Lab 04/18/18 0529 04/18/18 1116 04/18/18 1653  TROPONINI 0.07* 0.07* 0.08*   BNP: BNP (last 3 results) Recent Labs    02/12/18 1534  BNP 225.7*    ProBNP (last 3 results) No results for input(s): PROBNP in the last 8760 hours.  CBG: Recent Labs  Lab 04/22/18 2100 04/23/18 0756 04/23/18 1218 04/23/18 1725 04/23/18 2128  GLUCAP 337* 86 160* 170* 128*       Signed:  Nita Sells MD   [facility] 04/24/2018, 8:42 AM

## 2018-04-24 NOTE — Progress Notes (Signed)
Discharged orders has been placed since early this afternoon. Writer tried to call Son Vonna Kotyk) [place] this PM. Call directly forwarded to voice mail. Message left that pt was d/c'd this afternoon and if he can call back, haven't heard from him until now. Alger Memos, NP notified.

## 2018-04-24 NOTE — Plan of Care (Signed)
  Problem: Health Behavior/Discharge Planning: Goal: Ability to manage health-related needs will improve Outcome: Progressing   Problem: Clinical Measurements: Goal: Respiratory complications will improve Outcome: Progressing Goal: Cardiovascular complication will be avoided Outcome: Progressing   Problem: Nutrition: Goal: Adequate nutrition will be maintained Outcome: Progressing   

## 2018-04-25 DIAGNOSIS — R1030 Lower abdominal pain, unspecified: Secondary | ICD-10-CM

## 2018-04-25 LAB — GLUCOSE, CAPILLARY
GLUCOSE-CAPILLARY: 156 mg/dL — AB (ref 70–99)
GLUCOSE-CAPILLARY: 169 mg/dL — AB (ref 70–99)
Glucose-Capillary: 190 mg/dL — ABNORMAL HIGH (ref 70–99)

## 2018-04-25 MED ORDER — METOPROLOL TARTRATE 12.5 MG HALF TABLET
12.5000 mg | ORAL_TABLET | Freq: Two times a day (BID) | ORAL | Status: DC
  Administered 2018-04-25: 12.5 mg via ORAL
  Filled 2018-04-25: qty 1

## 2018-04-25 NOTE — Progress Notes (Signed)
Doing well-no major issues overnight. Sitting up and eating breakfast She could not go home yesterday as her son was not able to come and pick her up, she claims that her son is on his way to pick her up. Stable for discharge-see discharge summary as outlined yesterday.

## 2018-04-25 NOTE — Progress Notes (Addendum)
[facility] Progress Note   Subjective:  Seen in room, eating breakfast - no new complaints. Discharge was delayed due to transportation issues (her son could not pick her up) - Rhonda Lynch tells me that Rhonda Lynch will be leaving today.  Objective Vitals:   04/24/18 1407 04/24/18 1956 04/25/18 0509 04/25/18 0620  BP: 112/75 95/65 113/73   Pulse: (!) 128 (!) 118 97   Resp: 18 19 16    Temp: 99.4 F (37.4 C) 99.2 F (37.3 C) 98.5 F (36.9 C)   TempSrc: Oral Oral Oral   SpO2: 99% 100% 100%   Weight:    61.3 kg  Height:       Physical Exam General: Well appearing, with nasal oxygen. Heart: RRR; no murmur Lungs: CTA anteriorly Extremities: No LE edema Dialysis Access: L AVF + bruit  Additional Objective Labs: Basic Metabolic Panel: Recent Labs  Lab 04/19/18 0830  04/21/18 0444 04/22/18 0815 04/24/18 0621  NA 136   < > 134* 135 138  K 3.5   < > 4.0 4.1 3.9  CL 95*   < > 96* 98 103  CO2 28   < > 23 26 25   GLUCOSE 223*   < > 148* 133* 121*  BUN 26*   < > 39* 51* 34*  CREATININE 4.62*   < > 4.52* 6.10* 5.88*  CALCIUM 7.9*   < > 7.8* 7.5* 7.7*  PHOS 5.3*  --   --  6.3* 5.2*   < > = values in this interval not displayed.   Liver Function Tests: Recent Labs  Lab 04/20/18 0547 04/22/18 0815 04/24/18 0621  AST 25  --   --   ALT 25  --   --   ALKPHOS 161*  --   --   BILITOT 0.9  --   --   PROT 4.9*  --   --   ALBUMIN 2.0* 1.6* 1.5*   CBC: Recent Labs  Lab 04/19/18 0830 04/20/18 0547 04/22/18 0810 04/24/18 0621  WBC 7.9 10.5 6.1 4.1  HGB 9.7* 9.9* 8.0* 8.2*  HCT 31.7* 31.4* 25.5* 26.5*  MCV 85.0 83.5 82.0 80.8  PLT 64* 66* 102* 150   Blood Culture    Component Value Date/Time   SDES BLOOD RIGHT FOREARM 04/23/2018 1715   SPECREQUEST  04/23/2018 1715    BOTTLES DRAWN AEROBIC AND ANAEROBIC Blood Culture adequate volume   CULT  04/23/2018 1715    NO GROWTH < 24 HOURS Performed at [facility], [place] [address], [place], [place] [address]    REPTSTATUS PENDING 04/23/2018 1715    Cardiac Enzymes: Recent Labs  Lab 04/18/18 1116 04/18/18 1653  TROPONINI 0.07* 0.08*   CBG: Recent Labs  Lab 04/23/18 2128 04/24/18 1223 04/24/18 1604 04/24/18 2128 04/25/18 0753  GLUCAP 128* 92 120* 237* 156*   Medications: . piperacillin-tazobactam (ZOSYN)  IV 12.5 mL/hr at 04/24/18 2253   . calcitRIOL  0.25 mcg Oral QODAY  . Chlorhexidine Gluconate Cloth  6 each Topical Q0600  . Chlorhexidine Gluconate Cloth  6 each Topical Q0600  . cyclobenzaprine  10 mg Oral TID  . famotidine  20 mg Oral Daily  . feeding supplement (GLUCERNA SHAKE)  237 mL Oral BID BM  . insulin aspart  0-9 Units Subcutaneous TID WC  . insulin glargine  5 Units Subcutaneous QHS  . mouth rinse  15 mL Mouth Rinse BID  . metoprolol tartrate  12.5 mg Oral BID  . multivitamin  1 tablet Oral  QHS  . polyethylene glycol  17 g Oral Daily    Dialysis Orders: TTS at [facility] 4h 60kg 2/2.25 bath TDC/ maturing LUA BC AVF (03/06/18) Hep 2000 - venofer 100 every HD x 10 - mircera 225 ug every 2 wks  - vit d 0.25 ug mwf  Assessment/Plan: 1. ESRD: Continue HD TTS. 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  2. Volume: BP ok, no edema. Changing EDW down to 59.5kg on d/c. 3. R pneumonia: S/p Vanc/Zosyn - transitioning to PO Augmetin and Cefazolin with HD. 4. IS lung disease - presumed BOOP, 6/19, never biopsied.On oxy. 5. Hx drug abuse  6. HTN - no bp meds at home. 7. Thrombocytopenia -chronic 8. Hep C+  Veneta Penton, PA-C 04/25/2018, 9:24 AM  [facility] Pager: [phone]  Renal Attending: For discharge on PO and IV antibiotics. Erling Cruz, MD

## 2018-04-25 NOTE — Progress Notes (Signed)
Pt discharged home with son. Son picked Pt up at front doors, Per the Pt he was at the front to pick her up.  Pt given discharge instructions.

## 2018-04-26 ENCOUNTER — Encounter (HOSPITAL_COMMUNITY): Payer: Self-pay | Admitting: Emergency Medicine

## 2018-04-26 ENCOUNTER — Emergency Department (HOSPITAL_COMMUNITY): Payer: Medicaid Other

## 2018-04-26 ENCOUNTER — Telehealth: Payer: Self-pay

## 2018-04-26 ENCOUNTER — Inpatient Hospital Stay (HOSPITAL_COMMUNITY)
Admission: EM | Admit: 2018-04-26 | Discharge: 2018-05-03 | DRG: 314 | Disposition: A | Discharge status: Home Health | Payer: Medicaid Other | Attending: Family Medicine | Admitting: Family Medicine

## 2018-04-26 DIAGNOSIS — D696 Thrombocytopenia, unspecified: Secondary | ICD-10-CM | POA: Diagnosis present

## 2018-04-26 DIAGNOSIS — E1122 Type 2 diabetes mellitus with diabetic chronic kidney disease: Secondary | ICD-10-CM | POA: Diagnosis present

## 2018-04-26 DIAGNOSIS — N2581 Secondary hyperparathyroidism of renal origin: Secondary | ICD-10-CM | POA: Diagnosis present

## 2018-04-26 DIAGNOSIS — Z792 Long term (current) use of antibiotics: Secondary | ICD-10-CM | POA: Diagnosis not present

## 2018-04-26 DIAGNOSIS — R0603 Acute respiratory distress: Secondary | ICD-10-CM | POA: Diagnosis not present

## 2018-04-26 DIAGNOSIS — I770 Arteriovenous fistula, acquired: Secondary | ICD-10-CM | POA: Diagnosis not present

## 2018-04-26 DIAGNOSIS — I340 Nonrheumatic mitral (valve) insufficiency: Secondary | ICD-10-CM | POA: Diagnosis not present

## 2018-04-26 DIAGNOSIS — IMO0001 Reserved for inherently not codable concepts without codable children: Secondary | ICD-10-CM | POA: Diagnosis present

## 2018-04-26 DIAGNOSIS — D509 Iron deficiency anemia, unspecified: Secondary | ICD-10-CM | POA: Diagnosis present

## 2018-04-26 DIAGNOSIS — T827XXA Infection and inflammatory reaction due to other cardiac and vascular devices, implants and grafts, initial encounter: Secondary | ICD-10-CM | POA: Diagnosis present

## 2018-04-26 DIAGNOSIS — Z8701 Personal history of pneumonia (recurrent): Secondary | ICD-10-CM | POA: Diagnosis not present

## 2018-04-26 DIAGNOSIS — E8770 Fluid overload, unspecified: Secondary | ICD-10-CM | POA: Diagnosis present

## 2018-04-26 DIAGNOSIS — N186 End stage renal disease: Secondary | ICD-10-CM | POA: Diagnosis present

## 2018-04-26 DIAGNOSIS — J84116 Cryptogenic organizing pneumonia: Secondary | ICD-10-CM | POA: Diagnosis present

## 2018-04-26 DIAGNOSIS — R748 Abnormal levels of other serum enzymes: Secondary | ICD-10-CM | POA: Diagnosis not present

## 2018-04-26 DIAGNOSIS — I248 Other forms of acute ischemic heart disease: Secondary | ICD-10-CM | POA: Diagnosis not present

## 2018-04-26 DIAGNOSIS — I120 Hypertensive chronic kidney disease with stage 5 chronic kidney disease or end stage renal disease: Secondary | ICD-10-CM | POA: Diagnosis present

## 2018-04-26 DIAGNOSIS — B377 Candidal sepsis: Secondary | ICD-10-CM | POA: Diagnosis present

## 2018-04-26 DIAGNOSIS — I2609 Other pulmonary embolism with acute cor pulmonale: Secondary | ICD-10-CM | POA: Diagnosis not present

## 2018-04-26 DIAGNOSIS — F17210 Nicotine dependence, cigarettes, uncomplicated: Secondary | ICD-10-CM | POA: Diagnosis present

## 2018-04-26 DIAGNOSIS — Z794 Long term (current) use of insulin: Secondary | ICD-10-CM | POA: Diagnosis not present

## 2018-04-26 DIAGNOSIS — R000 Tachycardia, unspecified: Secondary | ICD-10-CM | POA: Diagnosis present

## 2018-04-26 DIAGNOSIS — T80211A Bloodstream infection due to central venous catheter, initial encounter: Secondary | ICD-10-CM | POA: Diagnosis present

## 2018-04-26 DIAGNOSIS — E1121 Type 2 diabetes mellitus with diabetic nephropathy: Secondary | ICD-10-CM

## 2018-04-26 DIAGNOSIS — Y95 Nosocomial condition: Secondary | ICD-10-CM | POA: Diagnosis present

## 2018-04-26 DIAGNOSIS — Z01810 Encounter for preprocedural cardiovascular examination: Secondary | ICD-10-CM | POA: Diagnosis not present

## 2018-04-26 DIAGNOSIS — E119 Type 2 diabetes mellitus without complications: Secondary | ICD-10-CM

## 2018-04-26 DIAGNOSIS — T82510A Breakdown (mechanical) of surgically created arteriovenous fistula, initial encounter: Secondary | ICD-10-CM | POA: Diagnosis present

## 2018-04-26 DIAGNOSIS — R0600 Dyspnea, unspecified: Secondary | ICD-10-CM | POA: Diagnosis present

## 2018-04-26 DIAGNOSIS — R079 Chest pain, unspecified: Secondary | ICD-10-CM | POA: Diagnosis not present

## 2018-04-26 DIAGNOSIS — A419 Sepsis, unspecified organism: Secondary | ICD-10-CM | POA: Diagnosis present

## 2018-04-26 DIAGNOSIS — S21101A Unspecified open wound of right front wall of thorax without penetration into thoracic cavity, initial encounter: Secondary | ICD-10-CM | POA: Diagnosis not present

## 2018-04-26 DIAGNOSIS — J449 Chronic obstructive pulmonary disease, unspecified: Secondary | ICD-10-CM | POA: Diagnosis present

## 2018-04-26 DIAGNOSIS — Z992 Dependence on renal dialysis: Secondary | ICD-10-CM

## 2018-04-26 DIAGNOSIS — J810 Acute pulmonary edema: Secondary | ICD-10-CM | POA: Diagnosis present

## 2018-04-26 DIAGNOSIS — J9621 Acute and chronic respiratory failure with hypoxia: Secondary | ICD-10-CM | POA: Diagnosis present

## 2018-04-26 DIAGNOSIS — R4182 Altered mental status, unspecified: Secondary | ICD-10-CM | POA: Diagnosis present

## 2018-04-26 DIAGNOSIS — Z419 Encounter for procedure for purposes other than remedying health state, unspecified: Secondary | ICD-10-CM

## 2018-04-26 DIAGNOSIS — E876 Hypokalemia: Secondary | ICD-10-CM | POA: Diagnosis present

## 2018-04-26 DIAGNOSIS — B1920 Unspecified viral hepatitis C without hepatic coma: Secondary | ICD-10-CM | POA: Diagnosis present

## 2018-04-26 DIAGNOSIS — Z9115 Patient's noncompliance with renal dialysis: Secondary | ICD-10-CM

## 2018-04-26 DIAGNOSIS — Z9981 Dependence on supplemental oxygen: Secondary | ICD-10-CM

## 2018-04-26 DIAGNOSIS — E872 Acidosis: Secondary | ICD-10-CM | POA: Diagnosis present

## 2018-04-26 DIAGNOSIS — I272 Pulmonary hypertension, unspecified: Secondary | ICD-10-CM | POA: Diagnosis present

## 2018-04-26 DIAGNOSIS — B49 Unspecified mycosis: Secondary | ICD-10-CM | POA: Diagnosis not present

## 2018-04-26 DIAGNOSIS — T82898A Other specified complication of vascular prosthetic devices, implants and grafts, initial encounter: Secondary | ICD-10-CM | POA: Diagnosis not present

## 2018-04-26 DIAGNOSIS — Z87898 Personal history of other specified conditions: Secondary | ICD-10-CM | POA: Diagnosis not present

## 2018-04-26 DIAGNOSIS — I2699 Other pulmonary embolism without acute cor pulmonale: Secondary | ICD-10-CM | POA: Diagnosis present

## 2018-04-26 DIAGNOSIS — D649 Anemia, unspecified: Secondary | ICD-10-CM | POA: Diagnosis not present

## 2018-04-26 DIAGNOSIS — X58XXXA Exposure to other specified factors, initial encounter: Secondary | ICD-10-CM | POA: Diagnosis not present

## 2018-04-26 DIAGNOSIS — R0781 Pleurodynia: Secondary | ICD-10-CM | POA: Diagnosis not present

## 2018-04-26 LAB — BASIC METABOLIC PANEL
ANION GAP: 16 — AB (ref 5–15)
BUN: 8 mg/dL (ref 8–23)
CHLORIDE: 97 mmol/L — AB (ref 98–111)
CO2: 25 mmol/L (ref 22–32)
Calcium: 7.8 mg/dL — ABNORMAL LOW (ref 8.9–10.3)
Creatinine, Ser: 3.14 mg/dL — ABNORMAL HIGH (ref 0.44–1.00)
GFR calc Af Amer: 17 mL/min — ABNORMAL LOW (ref >60)
GFR calc non Af Amer: 15 mL/min — ABNORMAL LOW (ref >60)
Glucose, Bld: 153 mg/dL — ABNORMAL HIGH (ref 70–99)
Potassium: 3.9 mmol/L (ref 3.5–5.1)
Sodium: 138 mmol/L (ref 135–145)

## 2018-04-26 LAB — BLOOD CULTURE ID PANEL (REFLEXED)
Acinetobacter baumannii: NOT DETECTED
CANDIDA PARAPSILOSIS: DETECTED — AB
CANDIDA TROPICALIS: NOT DETECTED
Candida albicans: NOT DETECTED
Candida glabrata: NOT DETECTED
Candida krusei: NOT DETECTED
Enterobacter cloacae complex: NOT DETECTED
Enterobacteriaceae species: NOT DETECTED
Enterococcus species: NOT DETECTED
Escherichia coli: NOT DETECTED
Haemophilus influenzae: NOT DETECTED
KLEBSIELLA OXYTOCA: NOT DETECTED
Klebsiella pneumoniae: NOT DETECTED
Listeria monocytogenes: NOT DETECTED
Neisseria meningitidis: NOT DETECTED
PROTEUS SPECIES: NOT DETECTED
PSEUDOMONAS AERUGINOSA: NOT DETECTED
SERRATIA MARCESCENS: NOT DETECTED
STAPHYLOCOCCUS AUREUS BCID: NOT DETECTED
STAPHYLOCOCCUS SPECIES: NOT DETECTED
Streptococcus agalactiae: NOT DETECTED
Streptococcus pneumoniae: NOT DETECTED
Streptococcus pyogenes: NOT DETECTED
Streptococcus species: NOT DETECTED

## 2018-04-26 LAB — CBC
HCT: 38.1 % (ref 36.0–46.0)
HEMOGLOBIN: 11.5 g/dL — AB (ref 12.0–15.0)
MCH: 24.8 pg — AB (ref 26.0–34.0)
MCHC: 30.2 g/dL (ref 30.0–36.0)
MCV: 82.1 fL (ref 78.0–100.0)
Platelets: 264 10*3/uL (ref 150–400)
RBC: 4.64 MIL/uL (ref 3.87–5.11)
RDW: 19.6 % — ABNORMAL HIGH (ref 11.5–15.5)
WBC: 7.9 10*3/uL (ref 4.0–10.5)

## 2018-04-26 LAB — GLUCOSE, CAPILLARY: GLUCOSE-CAPILLARY: 117 mg/dL — AB (ref 70–99)

## 2018-04-26 LAB — MRSA PCR SCREENING: MRSA BY PCR: NEGATIVE

## 2018-04-26 LAB — D-DIMER, QUANTITATIVE (NOT AT ARMC): D DIMER QUANT: 10.38 ug{FEU}/mL — AB (ref 0.00–0.50)

## 2018-04-26 LAB — I-STAT CG4 LACTIC ACID, ED
LACTIC ACID, VENOUS: 4.83 mmol/L — AB (ref 0.5–1.9)
Lactic Acid, Venous: 1.92 mmol/L — ABNORMAL HIGH (ref 0.5–1.9)

## 2018-04-26 LAB — CREATININE, SERUM
Creatinine, Ser: 3.19 mg/dL — ABNORMAL HIGH (ref 0.44–1.00)
GFR, EST AFRICAN AMERICAN: 17 mL/min — AB (ref >60)
GFR, EST NON AFRICAN AMERICAN: 14 mL/min — AB (ref >60)

## 2018-04-26 LAB — I-STAT TROPONIN, ED: TROPONIN I, POC: 0.06 ng/mL (ref 0.00–0.08)

## 2018-04-26 LAB — TROPONIN I: Troponin I: 0.04 ng/mL (ref <0.03)

## 2018-04-26 MED ORDER — ACETAMINOPHEN 650 MG RE SUPP: 650.0000 mg | Freq: Four times a day (QID) | RECTAL | Status: DC | PRN

## 2018-04-26 MED ORDER — SODIUM CHLORIDE 0.9% FLUSH
3.0000 mL | Freq: Two times a day (BID) | INTRAVENOUS | Status: DC
Start: 2018-04-26 — End: 2018-05-03
  Administered 2018-04-26 – 2018-05-02 (×7): 3 mL via INTRAVENOUS

## 2018-04-26 MED ORDER — ONDANSETRON HCL 4 MG PO TABS
4.0000 mg | ORAL_TABLET | Freq: Four times a day (QID) | ORAL | Status: DC | PRN
  Filled 2018-04-26: qty 1

## 2018-04-26 MED ORDER — ZOLPIDEM TARTRATE 5 MG PO TABS
5.0000 mg | ORAL_TABLET | Freq: Every evening | ORAL | Status: DC | PRN
  Administered 2018-04-26 – 2018-05-02 (×6): 5 mg via ORAL
  Filled 2018-04-26 (×8): qty 1

## 2018-04-26 MED ORDER — INSULIN GLARGINE 100 UNIT/ML ~~LOC~~ SOLN
5.0000 [IU] | Freq: Every day | SUBCUTANEOUS | Status: DC
  Administered 2018-04-26 – 2018-05-02 (×6): 5 [IU] via SUBCUTANEOUS
  Filled 2018-04-26 (×9): qty 0.05

## 2018-04-26 MED ORDER — ACETAMINOPHEN 325 MG PO TABS
650.0000 mg | ORAL_TABLET | Freq: Four times a day (QID) | ORAL | Status: DC | PRN
  Filled 2018-04-26: qty 2

## 2018-04-26 MED ORDER — SODIUM CHLORIDE 0.9 % IV SOLN
100.0000 mg | INTRAVENOUS | Status: DC
  Administered 2018-04-27 – 2018-04-30 (×4): 100 mg via INTRAVENOUS
  Filled 2018-04-26 (×6): qty 100

## 2018-04-26 MED ORDER — METOPROLOL TARTRATE 12.5 MG HALF TABLET
12.5000 mg | ORAL_TABLET | Freq: Two times a day (BID) | ORAL | Status: DC
  Administered 2018-04-27: 12.5 mg via ORAL
  Filled 2018-04-26: qty 1

## 2018-04-26 MED ORDER — SODIUM CHLORIDE 0.9 % IV SOLN
250.0000 mL | INTRAVENOUS | Status: DC | PRN
  Administered 2018-04-26 – 2018-04-27 (×3): 250 mL via INTRAVENOUS
  Administered 2018-04-29: 13:00:00 via INTRAVENOUS

## 2018-04-26 MED ORDER — INSULIN ASPART 100 UNIT/ML ~~LOC~~ SOLN
5.0000 [IU] | Freq: Three times a day (TID) | SUBCUTANEOUS | Status: DC
  Administered 2018-04-27 – 2018-05-02 (×10): 5 [IU] via SUBCUTANEOUS

## 2018-04-26 MED ORDER — SODIUM CHLORIDE 0.9 % IV SOLN
1.0000 g | INTRAVENOUS | Status: DC
  Administered 2018-04-26 – 2018-04-27 (×2): 1 g via INTRAVENOUS
  Filled 2018-04-26 (×4): qty 1

## 2018-04-26 MED ORDER — HYOSCYAMINE SULFATE 0.125 MG SL SUBL
0.2500 mg | SUBLINGUAL_TABLET | Freq: Once | SUBLINGUAL | Status: AC
  Administered 2018-04-26: 0.25 mg via SUBLINGUAL
  Filled 2018-04-26: qty 2

## 2018-04-26 MED ORDER — BENZONATATE 100 MG PO CAPS
100.0000 mg | ORAL_CAPSULE | Freq: Three times a day (TID) | ORAL | Status: DC | PRN
  Administered 2018-04-30: 100 mg via ORAL
  Filled 2018-04-26: qty 1

## 2018-04-26 MED ORDER — SODIUM CHLORIDE 0.9 % IV SOLN
200.0000 mg | Freq: Once | INTRAVENOUS | Status: AC
  Administered 2018-04-26: 200 mg via INTRAVENOUS
  Filled 2018-04-26 (×2): qty 200

## 2018-04-26 MED ORDER — SODIUM CHLORIDE 0.9 % IV BOLUS
250.0000 mL | Freq: Once | INTRAVENOUS | Status: AC
  Administered 2018-04-26: 250 mL via INTRAVENOUS

## 2018-04-26 MED ORDER — VANCOMYCIN HCL IN DEXTROSE 750-5 MG/150ML-% IV SOLN: 750.0000 mg | INTRAVENOUS | Status: DC

## 2018-04-26 MED ORDER — HEPARIN SODIUM (PORCINE) 5000 UNIT/ML IJ SOLN
5000.0000 [IU] | Freq: Three times a day (TID) | INTRAMUSCULAR | Status: DC
  Administered 2018-04-26 – 2018-04-27 (×2): 5000 [IU] via SUBCUTANEOUS
  Filled 2018-04-26 (×2): qty 1

## 2018-04-26 MED ORDER — RENA-VITE PO TABS
1.0000 | ORAL_TABLET | Freq: Every day | ORAL | Status: DC
  Administered 2018-04-26 – 2018-05-02 (×7): 1 via ORAL
  Filled 2018-04-26 (×7): qty 1

## 2018-04-26 MED ORDER — METOPROLOL TARTRATE 12.5 MG HALF TABLET: 12.5000 mg | ORAL_TABLET | Freq: Two times a day (BID) | ORAL | Status: DC

## 2018-04-26 MED ORDER — CALCITRIOL 0.25 MCG PO CAPS
0.2500 ug | ORAL_CAPSULE | ORAL | Status: DC
  Administered 2018-04-28 – 2018-05-02 (×3): 0.25 ug via ORAL
  Filled 2018-04-26 (×3): qty 1

## 2018-04-26 MED ORDER — VANCOMYCIN HCL IN DEXTROSE 1-5 GM/200ML-% IV SOLN
1000.0000 mg | Freq: Once | INTRAVENOUS | Status: AC
  Administered 2018-04-26: 1000 mg via INTRAVENOUS
  Filled 2018-04-26: qty 200

## 2018-04-26 MED ORDER — SODIUM CHLORIDE 0.9% FLUSH: 3.0000 mL | INTRAVENOUS | Status: DC | PRN

## 2018-04-26 NOTE — Progress Notes (Addendum)
CRITICAL VALUE ALERT  Critical Value:  Troponin 0.04  Date & Time Notied:  04/26/2018 2000  Provider Notified: X. Blount, NP 2005  Orders Received/Actions taken: Awaiting response

## 2018-04-26 NOTE — ED Notes (Signed)
Called lab to add on blood

## 2018-04-26 NOTE — ED Notes (Signed)
[facility] WOULD LIKE TO BE CONTACTED WHEN PATIENT HAS A ROOM/WORK-UP COMPLETE -   JOSH - [phone]

## 2018-04-26 NOTE — Progress Notes (Signed)
Pharmacy Antibiotic Note  Rhonda Lynch is a 62 y.o. female recently discharged on 8/16 after being treated for HCAP - called back for admission due to 1 of 2 blood cultures from 8/14 growing candida parapsilosis - Eraxis has been started.Marland Kitchen  Pharmacy has been consulted for Vancomycin and Cefepime dosing due to concern for persistent HCAP.  Of noting - the patient was treated with Rocephin + Flagyl from 8/9-8/11 then transitioned to [place] from 8/11 - 8/13, then Zosyn monotherapy from 8/14-8/16.   There may be some residual Vancomycin leftover from dosing last admission - will reduce the loading dose slightly to account for this.   Of noting, fluconazole typically covers candida parapsilosis well and may be an alternative to anindulafungin  Plan: - Eraxis 200 mg IV x 1 followed by 100 mg IV every 24 hours - Vancomycin 1g IV x 1 dose followed by 750 mg/HD-TTS - Start Cefepime 1g IV every 24 hours - Will continue to follow HD schedule/duration, culture results, LOT, and antibiotic de-escalation plans      Temp (24hrs), Avg:97.9 F (36.6 C), Min:97.9 F (36.6 C), Max:97.9 F (36.6 C)  Recent Labs  Lab 04/20/18 0547 04/21/18 0444 04/22/18 0810 04/22/18 0815 04/24/18 0621 04/26/18 1726 04/26/18 1742  WBC 10.5  --  6.1  --  4.1 7.9  --   CREATININE 3.14* 4.52*  --  6.10* 5.88* 3.14*  --   LATICACIDVEN  --   --   --   --   --   --  4.83*    Estimated Creatinine Clearance: 15.2 mL/min (A) (by C-G formula based on SCr of 3.14 mg/dL (H)).    No Known Allergies  Antimicrobials this admission: Eraxis 8/17 >> Vanc 8/17 >> Cefepime 8/17 >>  Dose adjustments this admission: n/a  Microbiology results:  Last admit: 8/9 UCx >> mult species, needs recollect 8/9 BCx >> 1/2 micrococcus 8/14 BCx >> 1/2 candida parapsilosis  This admit: 8/17 BCx >>  Thank you for allowing pharmacy to be a part of this patient's care.  Alycia Rossetti, PharmD, BCPS Clinical  Pharmacist Pager: [phone] Clinical phone for 04/26/2018 from 7a-3:30p: [phone] If after 3:30p, please call main pharmacy at: [phone] Please check AMION for all [place] numbers 04/26/2018 7:49 PM

## 2018-04-26 NOTE — ED Provider Notes (Signed)
[facility] EMERGENCY DEPARTMENT Provider Note   CSN: [id number] Arrival date & time: 04/26/18  1649     History   Chief Complaint Chief Complaint  Patient presents with  . Altered Mental Status  . Chest Pain    HPI Rhonda Lynch is a 62 y.o. female.  Patient has a history of renal failure and dialysis.  She has ANCA vasculitis.  And was just admitted with healthcare acquired pneumonia.  Blood cultures were positive for Candida.  The hospital with spoke with ID and renal and they both felt like the patient need to come back in the hospital and get micafungin.  The history is provided by the patient and medical records. No language interpreter was used.  Illness  This is a recurrent problem. The current episode started more than 2 days ago. The problem occurs constantly. The problem has not changed since onset.Pertinent negatives include no chest pain, no abdominal pain and no headaches. Nothing aggravates the symptoms. Nothing relieves the symptoms. She has tried nothing for the symptoms. The treatment provided no relief.    Past Medical History:  Diagnosis Date  . Chronic kidney disease   . ESRD (end stage renal disease) ([place])   . Hypertension   . Oxygen deficiency    2 L at night  . Substance abuse ([place])    has been clean for 4 months     Patient Active Problem List   Diagnosis Date Noted  . Abdominal pain 04/18/2018  . DM (diabetes mellitus), type 2 with renal complications ([place]) 26/94/8546  . Right flank pain 04/18/2018  . Acute respiratory failure with hypoxia ([place]) 04/12/2018  . SVT (supraventricular tachycardia) ([place])   . Chronic renal failure   . Diabetes ([place]) 04/02/2018  . Hypertension   . Noncompliance of patient with renal dialysis ([place])   . Acute pulmonary edema ([facility])   . Elevated troponin 03/19/2018  . AKI (acute kidney injury) ([place])   . Fever   . 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 Diffuse pulmonary alveolar hemorrhage   . Hypoxemia    . Pulmonary infiltrate   . 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[place])   . ESRD (end stage renal disease) ([place]) 02/12/2018  . Acute respiratory failure ([place]) 02/12/2018  . Alcohol abuse 02/12/2018  . Anemia 02/12/2018  . Cocaine abuse ([place]) 02/12/2018  . Epigastric pain 02/12/2018  . Pneumonia 02/12/2018  . TOBACCO ABUSE 03/08/2010  . PNEUMONIA 03/08/2010  . UTI 03/08/2010  . ELEVATED BLOOD PRESSURE WITHOUT DIAGNOSIS OF HYPERTENSION 03/08/2010    Past Surgical History:  Procedure Laterality Date  . AV FISTULA PLACEMENT Left 03/05/2018   Procedure: ARTERIOVENOUS (AV) FISTULA CREATION  LEFT UPPER EXTREMITY;  Surgeon: [name], MD;  Location: [facility];  Service: Vascular;  Laterality: Left;  . HEMATOMA EVACUATION Left 03/06/2018   Procedure: EVACUATION HEMATOMA LEFT ARM;  Surgeon: Angelia Mould, MD;  Location: [facility];  Service: Vascular;  Laterality: Left;  . IR FLUORO GUIDE CV LINE RIGHT  02/18/2018  . IR FLUORO GUIDE CV LINE RIGHT  02/26/2018  . IR US GUIDE VASC ACCESS RIGHT  02/18/2018     OB History   None      Home Medications    Prior to Admission medications   Medication Sig Start Date End Date Taking? Authorizing Provider  acetaminophen (TYLENOL) 325 MG tablet Take 2 tablets (650 mg total) by mouth every 6 (six) hours as needed for mild pain (or Fever >/=  101). 04/24/18   Ghimire, Henreitta Leber, MD  amoxicillin-clavulanate (AUGMENTIN) 500-125 MG tablet Take 1 tablet (500 mg total) by mouth every evening for 3 days. 04/24/18 04/27/18  Jonetta Osgood, MD  benzonatate (TESSALON) 100 MG capsule Take 1 capsule (100 mg total) by mouth every 8 (eight) hours. Patient not taking: Reported on 04/18/2018 04/12/18   Pattricia Boss, MD  calcitRIOL (ROCALTROL) 0.25 MCG capsule Take 1 capsule (0.25 mcg total) by mouth every other day. 04/14/18   Nita Sells, MD  ceFAZolin 2 g in dextrose 5 % 100 mL ivpb Inject 2 g into the vein every dialysis for up to 3  doses. 04/24/18   Ghimire, Henreitta Leber, MD  feeding supplement, GLUCERNA SHAKE, (GLUCERNA SHAKE) LIQD Take 237 mLs by mouth 2 (two) times daily between meals. 04/24/18   Ghimire, Henreitta Leber, MD  insulin aspart (NOVOLOG) 100 UNIT/ML injection Inject 0-9 Units into the skin 3 (three) times daily with meals. 04/13/18   Nita Sells, MD  insulin aspart (NOVOLOG) 100 UNIT/ML injection Inject 0-5 Units into the skin at bedtime. 04/13/18   Nita Sells, MD  insulin glargine (LANTUS) 100 UNIT/ML injection Inject 0.05 mLs (5 Units total) into the skin at bedtime. 04/13/18   Nita Sells, MD  metoprolol tartrate (LOPRESSOR) 25 MG tablet Take 0.5 tablets (12.5 mg total) by mouth 2 (two) times daily. 04/24/18 04/24/19  Ghimire, Henreitta Leber, MD  multivitamin (RENA-VIT) TABS tablet Take 1 tablet by mouth at bedtime. 04/13/18   Nita Sells, MD  ondansetron (ZOFRAN) 4 MG tablet Take 1 tablet (4 mg total) by mouth every 6 (six) hours as needed for nausea. 04/13/18   Nita Sells, MD  traMADol (ULTRAM) 50 MG tablet Take 1 tablet (50 mg total) by mouth every 12 (twelve) hours as needed (refractory pain). 03/24/18   Patrecia Pour, MD  zolpidem (AMBIEN) 5 MG tablet Take 1 tablet (5 mg total) by mouth at bedtime as needed for sleep. 04/13/18   Nita Sells, MD    Family History Family History  Problem Relation Age of Onset  . Autoimmune disease Neg Hx     Social History Social History   Tobacco Use  . Smoking status: Current Every Day Smoker    Packs/day: 0.50  . Smokeless tobacco: Never Used  . Tobacco comment: trying to quit 04/02/2018  Substance Use Topics  . Alcohol use: Yes  . Drug use: Not Currently    Types: Cocaine     Allergies   Patient has no known allergies.   Review of Systems Review of Systems  Constitutional: Positive for fatigue. Negative for appetite change.  HENT: Negative for congestion, ear discharge and sinus pressure.   Eyes: Negative for discharge.    Respiratory: Negative for cough.   Cardiovascular: Negative for chest pain.  Gastrointestinal: Negative for abdominal pain and diarrhea.  Genitourinary: Negative for frequency and hematuria.  Musculoskeletal: Negative for back pain.  Skin: Negative for rash.  Neurological: Negative for seizures and headaches.  Psychiatric/Behavioral: Negative for hallucinations.     Physical Exam Updated Vital Signs BP 109/83 (BP Location: Right Arm)   Pulse (!) 135   Temp 97.9 F (36.6 C) (Oral)   Resp (!) 22   SpO2 100%   Physical Exam  Constitutional: She is oriented to person, place, and time. She appears well-developed.  HENT:  Head: Normocephalic.  Eyes: Conjunctivae and EOM are normal. No scleral icterus.  Neck: Neck supple. No thyromegaly present.  Cardiovascular: Exam reveals no gallop and  no friction rub.  No murmur heard. Tachycardic  Pulmonary/Chest: No stridor. She has no wheezes. She has no rales. She exhibits no tenderness.  Abdominal: She exhibits no distension. There is no tenderness. There is no rebound.  Musculoskeletal: Normal range of motion. She exhibits no edema.  Lymphadenopathy:    She has no cervical adenopathy.  Neurological: She is oriented to person, place, and time. She exhibits normal muscle tone. Coordination normal.  Skin: No rash noted. No erythema.  Psychiatric: She has a normal mood and affect. Her behavior is normal.     ED Treatments / Results  Labs (all labs ordered are listed, but only abnormal results are displayed) Labs Reviewed  BASIC METABOLIC PANEL - Abnormal; Notable for the following components:      Result Value   Chloride 97 (*)    Glucose, Bld 153 (*)    Creatinine, Ser 3.14 (*)    Calcium 7.8 (*)    GFR calc non Af Amer 15 (*)    GFR calc Af Amer 17 (*)    Anion gap 16 (*)    All other components within normal limits  CBC - Abnormal; Notable for the following components:   Hemoglobin 11.5 (*)    MCH 24.8 (*)    RDW 19.6 (*)     All other components within normal limits  I-STAT CG4 LACTIC ACID, ED - Abnormal; Notable for the following components:   Lactic Acid, Venous 4.83 (*)    All other components within normal limits  CULTURE, BLOOD (ROUTINE X 2)  CULTURE, BLOOD (ROUTINE X 2)  I-STAT TROPONIN, ED    EKG None  Radiology No results found.  Procedures Procedures (including critical care time)  Medications Ordered in ED Medications  sodium chloride 0.9 % bolus 250 mL (has no administration in time range)     Initial Impression / Assessment and Plan / ED Course  I have reviewed the triage vital signs and the nursing notes.  Pertinent labs & imaging results that were available during my care of the patient were reviewed by me and considered in my medical decision making (see chart for details).   Patient will be admitted for positive fungal blood culture.  She will be started on antifungal medicine    Final Clinical Impressions(s) / ED Diagnoses   Final diagnoses:  Fungal infection    ED Discharge Orders    None       Milton Ferguson, MD 04/26/18 1825

## 2018-04-26 NOTE — Progress Notes (Signed)
Received a phone call from Dr. Sloan Leiter stating concerns that patient has +fungus in blood culture results and patient has already been discharged from the hospital and is unable to contact family or patient. I spoke with off duty [employer] officer who was able to coordinate an officer to go to patients home address to request she return to the hospital for admission.

## 2018-04-26 NOTE — ED Triage Notes (Signed)
Patient presents to ED for assessment of chest pain, weakness, and altered mental status.  Patient's son states [employer] also showed up to his house and told him his mother needed to be brought to the ED immediately, per "a doctor".  When reviewing chart, patient appears to have had positive cultures for candida.  Patient c/o central chest pain, constant since her discharge 2 days ago.  Patient had dialysis earlier today.  Full treatment.  Symptoms have been worsening since.

## 2018-04-26 NOTE — H&P (Signed)
TRH H&P   Patient Demographics:    Rhonda Lynch, is a 62 y.o. female  MRN: [id number]   DOB - [birth year]  Admit Date - 04/26/2018  Outpatient Primary MD for the patient is Clent Demark, PA-C  Referring MD/NP/PA:   [place]  Outpatient Specialists:    Patient coming from: dialysis  Chief Complaint  Patient presents with  . Altered Mental Status  . Chest Pain      HPI:    Rhonda Lynch  is a 62 y.o. female, w dm2, hypertension, ESRD on HD (T, T, S), presents with recent admission for hcap, apparently c/o chest pain right sided x2 days as well as dyspnea. + dry cough.   Pt also noted to have blood culture + for candida, ID recommended admission and starting micafungin.    In ED,  CXR IMPRESSION: RIGHT LOWER lung airspace disease/consolidation likely representing pneumonia.   Na 138, K 3.9, Bun 8, Creatinine 3.14 Glucose 153 Wbc 7.9, Hgb  11.5, Plt 264 Lactic acid 4.83 Trop 0.06  Blood culture pending  Pt will be admitted for Hcap, sepsis (tachycardia, elevated lactic acid and + blood culture)  , and also chest pain    Review of systems:    In addition to the HPI above,  No Fever-chills, No Headache, No changes with Vision or hearing, No problems swallowing food or Liquids, No Abdominal pain, No Nausea or Vommitting, Bowel movements are regular, No Blood in stool or Urine, No dysuria, No new skin rashes or bruises, No new joints pains-aches,  No new weakness, tingling, numbness in any extremity, No recent weight gain or loss, No polyuria, polydypsia or polyphagia, No significant Mental Stressors.  A full 10 point Review of Systems was done, except as stated above, all other Review of Systems were negative.   With Past History of the following :    Past Medical History:  Diagnosis Date  . Chronic kidney disease   . ESRD (end stage renal  disease) ([place])   . Hypertension   . Oxygen deficiency    2 L at night  . Substance abuse ([place])    has been clean for 4 months       Past Surgical History:  Procedure Laterality Date  . AV FISTULA PLACEMENT Left 03/05/2018   Procedure: ARTERIOVENOUS (AV) FISTULA CREATION  LEFT UPPER EXTREMITY;  Surgeon: [name], MD;  Location: [facility];  Service: Vascular;  Laterality: Left;  . HEMATOMA EVACUATION Left 03/06/2018   Procedure: EVACUATION HEMATOMA LEFT ARM;  Surgeon: Angelia Mould, MD;  Location: [facility];  Service: Vascular;  Laterality: Left;  . IR FLUORO GUIDE CV LINE RIGHT  02/18/2018  . IR FLUORO GUIDE CV LINE RIGHT  02/26/2018  . IR US GUIDE VASC ACCESS RIGHT  02/18/2018      Social History:     Social History   Tobacco  Use  . Smoking status: Current Every Day Smoker    Packs/day: 0.50  . Smokeless tobacco: Never Used  . Tobacco comment: trying to quit 04/02/2018  Substance Use Topics  . Alcohol use: Yes     Lives - at home w son  Mobility - walks by self  Family History :     Family History  Problem Relation Age of Onset  . Autoimmune disease Neg Hx        Home Medications:   Prior to Admission medications   Medication Sig Start Date End Date Taking? Authorizing Provider  acetaminophen (TYLENOL) 325 MG tablet Take 2 tablets (650 mg total) by mouth every 6 (six) hours as needed for mild pain (or Fever >/= 101). 04/24/18  Yes Ghimire, Henreitta Leber, MD  calcitRIOL (ROCALTROL) 0.25 MCG capsule Take 1 capsule (0.25 mcg total) by mouth every other day. 04/14/18  Yes Nita Sells, MD  insulin aspart (NOVOLOG) 100 UNIT/ML injection Inject 0-9 Units into the skin 3 (three) times daily with meals. Patient taking differently: Inject 5 Units into the skin 3 (three) times daily with meals.  04/13/18  Yes Nita Sells, MD  insulin glargine (LANTUS) 100 UNIT/ML injection Inject 0.05 mLs (5 Units total) into the skin at bedtime. 04/13/18  Yes Nita Sells,  MD  multivitamin (RENA-VIT) TABS tablet Take 1 tablet by mouth at bedtime. 04/13/18  Yes Nita Sells, MD  ondansetron (ZOFRAN) 4 MG tablet Take 1 tablet (4 mg total) by mouth every 6 (six) hours as needed for nausea. 04/13/18  Yes Nita Sells, MD  zolpidem (AMBIEN) 5 MG tablet Take 1 tablet (5 mg total) by mouth at bedtime as needed for sleep. 04/13/18  Yes Nita Sells, MD  amoxicillin-clavulanate (AUGMENTIN) 500-125 MG tablet Take 1 tablet (500 mg total) by mouth every evening for 3 days. 04/24/18 04/27/18  Jonetta Osgood, MD  ceFAZolin 2 g in dextrose 5 % 100 mL ivpb Inject 2 g into the vein every dialysis for up to 3 doses. 04/24/18   Ghimire, Henreitta Leber, MD  feeding supplement, GLUCERNA SHAKE, (GLUCERNA SHAKE) LIQD Take 237 mLs by mouth 2 (two) times daily between meals. Patient not taking: Reported on 04/26/2018 04/24/18   Jonetta Osgood, MD  metoprolol tartrate (LOPRESSOR) 25 MG tablet Take 0.5 tablets (12.5 mg total) by mouth 2 (two) times daily. 04/24/18 04/24/19  GhimireHenreitta Leber, MD     Allergies:    No Known Allergies   Physical Exam:   Vitals  Blood pressure 107/79, pulse (!) 119, temperature 97.9 F (36.6 C), temperature source Oral, resp. rate (!) 25, SpO2 100 %.   1. General  lying in bed in NAD,    2. Normal affect and insight, Not Suicidal or Homicidal, Awake Alert, Oriented X 3.  3. No F.N deficits, ALL C.Nerves Intact, Strength 5/5 all 4 extremities, Sensation intact all 4 extremities, Plantars down going.  4. Ears and Eyes appear Normal, Conjunctivae clear, PERRLA. Moist Oral Mucosa.  5. Supple Neck, No JVD, No cervical lymphadenopathy appriciated, No Carotid Bruits.  6. Symmetrical Chest wall movement, Good air movement bilaterally, crackles right lower lung  7. RRR, No Gallops, Rubs or Murmurs, No Parasternal Heave.  8. Positive Bowel Sounds, Abdomen Soft, No tenderness, No organomegaly appriciated,No rebound -guarding or  rigidity.  9.  No Cyanosis, Normal Skin Turgor, No Skin Rash or Bruise.  10. Good muscle tone,  joints appear normal , no effusions, Normal ROM.  11. No Palpable Lymph Nodes  in Neck or Axillae   Dialysis catheter right upper chest  Left AVF   Data Review:    CBC Recent Labs  Lab 04/20/18 0547 04/22/18 0810 04/24/18 0621 04/26/18 1726  WBC 10.5 6.1 4.1 7.9  HGB 9.9* 8.0* 8.2* 11.5*  HCT 31.4* 25.5* 26.5* 38.1  PLT 66* 102* 150 264  MCV 83.5 82.0 80.8 82.1  MCH 26.3 25.7* 25.0* 24.8*  MCHC 31.5 31.4 30.9 30.2  RDW 17.7* 17.6* 18.6* 19.6*   ------------------------------------------------------------------------------------------------------------------  Chemistries  Recent Labs  Lab 04/20/18 0547 04/21/18 0444 04/22/18 0815 04/24/18 0621 04/26/18 1726  NA 133* 134* 135 138 138  K 3.8 4.0 4.1 3.9 3.9  CL 96* 96* 98 103 97*  CO2 23 23 26 25 25   GLUCOSE 111* 148* 133* 121* 153*  BUN 18 39* 51* 34* 8  CREATININE 3.14* 4.52* 6.10* 5.88* 3.14*  CALCIUM 7.7* 7.8* 7.5* 7.7* 7.8*  AST 25  --   --   --   --   ALT 25  --   --   --   --   ALKPHOS 161*  --   --   --   --   BILITOT 0.9  --   --   --   --    ------------------------------------------------------------------------------------------------------------------ estimated creatinine clearance is 15.2 mL/min (A) (by C-G formula based on SCr of 3.14 mg/dL (H)). ------------------------------------------------------------------------------------------------------------------ No results for input(s): TSH, T4TOTAL, T3FREE, THYROIDAB in the last 72 hours.  Invalid input(s): FREET3  Coagulation profile No results for input(s): INR, PROTIME in the last 168 hours. ------------------------------------------------------------------------------------------------------------------- No results for input(s): DDIMER in the last 72  hours. -------------------------------------------------------------------------------------------------------------------  Cardiac Enzymes No results for input(s): CKMB, TROPONINI, MYOGLOBIN in the last 168 hours.  Invalid input(s): CK ------------------------------------------------------------------------------------------------------------------    Component Value Date/Time   BNP 225.7 (H) 02/12/2018 1534     ---------------------------------------------------------------------------------------------------------------  Urinalysis    Component Value Date/Time   COLORURINE AMBER (A) 04/17/2018 2312   APPEARANCEUR CLOUDY (A) 04/17/2018 2312   LABSPEC 1.017 04/17/2018 2312   PHURINE 8.0 04/17/2018 2312   GLUCOSEU 50 (A) 04/17/2018 2312   HGBUR MODERATE (A) 04/17/2018 2312   HGBUR trace-intact 03/08/2010 1452   BILIRUBINUR NEGATIVE 04/17/2018 2312   KETONESUR NEGATIVE 04/17/2018 2312   PROTEINUR >=300 (A) 04/17/2018 2312   UROBILINOGEN 0.2 03/08/2010 1452   NITRITE NEGATIVE 04/17/2018 2312   LEUKOCYTESUR TRACE (A) 04/17/2018 2312    ----------------------------------------------------------------------------------------------------------------   Imaging Results:    Dg Chest 2 View  Result Date: 04/26/2018 CLINICAL DATA:  Chest pain altered mental status and sepsis. EXAM: CHEST - 2 VIEW COMPARISON:  04/17/2018 and prior radiographs FINDINGS: New RIGHT LOWER lung airspace disease/consolidation likely represents pneumonia. Mild cardiomegaly and RIGHT central venous catheter again noted. No definite pleural effusion or pneumothorax. The LEFT lung is clear. IMPRESSION: RIGHT LOWER lung airspace disease/consolidation likely representing pneumonia. Electronically Signed   By: Margarette Canada M.D.   On: 04/26/2018 18:34      Assessment & Plan:    Principal Problem:   Sepsis ([place]) Active Problems:   ESRD (end stage renal disease) ([place])   Anemia   DM (diabetes mellitus), type 2  with renal complications ([facility])   Dyspnea   Chest pain   Tachycardia    Sepsis (tachycardia, elevated lactic acid, + blood culture) Blood culture x2 Please consult ID in AM Eraxis, no micafungin available  Hcap Blood culture x2 Sputum gram stain, culture Urine strep antigen, urine legionella antigen Vanco iv, cefepime iv  pharmacy to dose Check cbc in am  Tachycardia Tele Trop I q6hx3 Check cardiac echo  Chest pain Tele Trop I q6h x3 Check cardiac echo Cardiology consult by email  Dm1 Cont Lantus fsbs ac and qhs, ISS  ESRD on HD, T, T, S Cont Rocaltrol Check cmp in am  Hypertension Cont metoprolol 12.5mg  po bid  DVT Prophylaxis Heparin -- SCDs  AM Labs Ordered, also please review Full Orders  Family Communication: Admission, patients condition and plan of care including tests being ordered have been discussed with the patient  who indicate understanding and agree with the plan and Code Status.  Code Status  FULL CODE  Likely DC to  home  Condition GUARDED   Consults called: cardiology by email, please call ID in am, please call nephrology in AM   Admission status: inpatient  Time spent in minutes : 70   Jani Gravel M.D on 04/26/2018 at 6:54 PM  Between 7am to 7pm - Pager - [phone]  . After 7pm go to [email or web address] - password [facility]  [facility] - Office  [phone]

## 2018-04-26 NOTE — Progress Notes (Addendum)
Notified by lab-that 1/2 blood cx on 8/14 positive for candida. Blood cx till 8/16 (on discharge) was negative Spoke with ID ([place]),renal ([place])-will need patient to come back to the hospital and started on Micafungin. Will need to repeat blood cx, and other routine work up  [facility] [phone] left a message-regarding need to come back to the hospital to get admitted. Will try later in a few hours again.   Addendum 1:10pm Attempted to call Ms Goswick again-at above tel no-goes directly to voicemail. Have left another message.  Have spoke with [facility] to see if we can send someone to the noted address as well.

## 2018-04-27 ENCOUNTER — Inpatient Hospital Stay (HOSPITAL_COMMUNITY): Payer: Medicaid Other

## 2018-04-27 ENCOUNTER — Encounter (HOSPITAL_COMMUNITY): Payer: Self-pay | Admitting: Diagnostic Radiology

## 2018-04-27 DIAGNOSIS — R0781 Pleurodynia: Secondary | ICD-10-CM

## 2018-04-27 DIAGNOSIS — I2699 Other pulmonary embolism without acute cor pulmonale: Secondary | ICD-10-CM | POA: Diagnosis present

## 2018-04-27 DIAGNOSIS — A419 Sepsis, unspecified organism: Secondary | ICD-10-CM

## 2018-04-27 DIAGNOSIS — B49 Unspecified mycosis: Secondary | ICD-10-CM | POA: Diagnosis present

## 2018-04-27 DIAGNOSIS — I248 Other forms of acute ischemic heart disease: Secondary | ICD-10-CM

## 2018-04-27 DIAGNOSIS — R748 Abnormal levels of other serum enzymes: Secondary | ICD-10-CM

## 2018-04-27 DIAGNOSIS — S21101A Unspecified open wound of right front wall of thorax without penetration into thoracic cavity, initial encounter: Secondary | ICD-10-CM

## 2018-04-27 DIAGNOSIS — R0603 Acute respiratory distress: Secondary | ICD-10-CM

## 2018-04-27 DIAGNOSIS — I770 Arteriovenous fistula, acquired: Secondary | ICD-10-CM

## 2018-04-27 DIAGNOSIS — F17210 Nicotine dependence, cigarettes, uncomplicated: Secondary | ICD-10-CM

## 2018-04-27 DIAGNOSIS — Z87898 Personal history of other specified conditions: Secondary | ICD-10-CM

## 2018-04-27 DIAGNOSIS — X58XXXA Exposure to other specified factors, initial encounter: Secondary | ICD-10-CM

## 2018-04-27 DIAGNOSIS — R000 Tachycardia, unspecified: Secondary | ICD-10-CM

## 2018-04-27 DIAGNOSIS — Z8701 Personal history of pneumonia (recurrent): Secondary | ICD-10-CM

## 2018-04-27 DIAGNOSIS — E1122 Type 2 diabetes mellitus with diabetic chronic kidney disease: Secondary | ICD-10-CM

## 2018-04-27 HISTORY — PX: IR REMOVAL TUN CV CATH W/O FL: IMG2289

## 2018-04-27 LAB — COMPREHENSIVE METABOLIC PANEL
ALBUMIN: 1.6 g/dL — AB (ref 3.5–5.0)
ALT: 14 U/L (ref 0–44)
ANION GAP: 13 (ref 5–15)
AST: 28 U/L (ref 15–41)
Alkaline Phosphatase: 113 U/L (ref 38–126)
BILIRUBIN TOTAL: 0.6 mg/dL (ref 0.3–1.2)
BUN: 17 mg/dL (ref 8–23)
CO2: 28 mmol/L (ref 22–32)
Calcium: 7.5 mg/dL — ABNORMAL LOW (ref 8.9–10.3)
Chloride: 97 mmol/L — ABNORMAL LOW (ref 98–111)
Creatinine, Ser: 4.3 mg/dL — ABNORMAL HIGH (ref 0.44–1.00)
GFR calc Af Amer: 12 mL/min — ABNORMAL LOW (ref >60)
GFR calc non Af Amer: 10 mL/min — ABNORMAL LOW (ref >60)
GLUCOSE: 119 mg/dL — AB (ref 70–99)
POTASSIUM: 3 mmol/L — AB (ref 3.5–5.1)
SODIUM: 138 mmol/L (ref 135–145)
Total Protein: 5.3 g/dL — ABNORMAL LOW (ref 6.5–8.1)

## 2018-04-27 LAB — GLUCOSE, CAPILLARY
GLUCOSE-CAPILLARY: 71 mg/dL (ref 70–99)
Glucose-Capillary: 99 mg/dL (ref 70–99)
Glucose-Capillary: 142 mg/dL — ABNORMAL HIGH (ref 70–99)
Glucose-Capillary: 79 mg/dL (ref 70–99)

## 2018-04-27 LAB — CBC
HEMATOCRIT: 28.6 % — AB (ref 36.0–46.0)
Hemoglobin: 8.8 g/dL — ABNORMAL LOW (ref 12.0–15.0)
MCH: 24.5 pg — ABNORMAL LOW (ref 26.0–34.0)
MCHC: 30.8 g/dL (ref 30.0–36.0)
MCV: 79.7 fL (ref 78.0–100.0)
PLATELETS: 237 10*3/uL (ref 150–400)
RBC: 3.59 MIL/uL — ABNORMAL LOW (ref 3.87–5.11)
RDW: 18.8 % — AB (ref 11.5–15.5)
WBC: 4.7 10*3/uL (ref 4.0–10.5)

## 2018-04-27 LAB — TROPONIN I
TROPONIN I: 0.04 ng/mL — AB (ref <0.03)
TROPONIN I: 0.07 ng/mL — AB (ref <0.03)

## 2018-04-27 LAB — CULTURE, BLOOD (ROUTINE X 2): Special Requests: ADEQUATE

## 2018-04-27 LAB — MAGNESIUM: Magnesium: 1.7 mg/dL (ref 1.7–2.4)

## 2018-04-27 MED ORDER — TECHNETIUM TC 99M DIETHYLENETRIAME-PENTAACETIC ACID
32.3000 | Freq: Once | INTRAVENOUS | Status: AC | PRN
  Administered 2018-04-27: 32.3 via RESPIRATORY_TRACT

## 2018-04-27 MED ORDER — LIDOCAINE HCL (PF) 1 % IJ SOLN
INTRAMUSCULAR | Status: DC | PRN
  Administered 2018-04-27: 10 mL

## 2018-04-27 MED ORDER — CHLORHEXIDINE GLUCONATE 4 % EX LIQD
CUTANEOUS | Status: AC
  Filled 2018-04-27: qty 15

## 2018-04-27 MED ORDER — METOPROLOL TARTRATE 25 MG PO TABS
25.0000 mg | ORAL_TABLET | Freq: Two times a day (BID) | ORAL | Status: DC
  Administered 2018-04-27 – 2018-05-02 (×9): 25 mg via ORAL
  Filled 2018-04-27 (×8): qty 1

## 2018-04-27 MED ORDER — IOPAMIDOL (ISOVUE-370) INJECTION 76%
INTRAVENOUS | Status: AC
  Filled 2018-04-27: qty 100

## 2018-04-27 MED ORDER — HEPARIN (PORCINE) IN NACL 100-0.45 UNIT/ML-% IJ SOLN
1100.0000 [IU]/h | INTRAMUSCULAR | Status: DC
  Administered 2018-04-27: 950 [IU]/h via INTRAVENOUS
  Administered 2018-04-29: 1100 [IU]/h via INTRAVENOUS
  Filled 2018-04-27 (×3): qty 250

## 2018-04-27 MED ORDER — TECHNETIUM TO 99M ALBUMIN AGGREGATED
4.2000 | Freq: Once | INTRAVENOUS | Status: AC | PRN
  Administered 2018-04-27: 4.2 via INTRAVENOUS

## 2018-04-27 MED ORDER — HEPARIN BOLUS VIA INFUSION
2000.0000 [IU] | Freq: Once | INTRAVENOUS | Status: AC
  Administered 2018-04-27: 2000 [IU] via INTRAVENOUS
  Filled 2018-04-27: qty 2000

## 2018-04-27 MED ORDER — TRAMADOL HCL 50 MG PO TABS
50.0000 mg | ORAL_TABLET | Freq: Once | ORAL | Status: AC
  Administered 2018-04-27: 50 mg via ORAL
  Filled 2018-04-27 (×2): qty 1

## 2018-04-27 MED ORDER — TRAMADOL HCL 50 MG PO TABS
50.0000 mg | ORAL_TABLET | Freq: Once | ORAL | Status: AC
  Administered 2018-04-27: 50 mg via ORAL
  Filled 2018-04-27: qty 1

## 2018-04-27 MED ORDER — IOPAMIDOL (ISOVUE-370) INJECTION 76%: 100.0000 mL | Freq: Once | INTRAVENOUS | Status: DC | PRN

## 2018-04-27 MED ORDER — POTASSIUM CHLORIDE CRYS ER 20 MEQ PO TBCR
40.0000 meq | EXTENDED_RELEASE_TABLET | Freq: Two times a day (BID) | ORAL | Status: AC
  Administered 2018-04-27 (×2): 40 meq via ORAL
  Filled 2018-04-27 (×2): qty 2

## 2018-04-27 MED ORDER — LIDOCAINE HCL 1 % IJ SOLN
INTRAMUSCULAR | Status: AC
  Filled 2018-04-27: qty 20

## 2018-04-27 NOTE — Consult Note (Signed)
Rhonda Lynch for Infectious Disease    Date of Admission:  04/26/2018   Total days of antibiotics: 9           1 eraxis/vanco/cefepime           8-9 to 8-11Rocephin + Flagyl            8-11 to 8-13 Vanc + Zosyn           8-14 to 8-16 zosyn               Reason for Consult: fungemia    Referring Provider: Ghimire   Assessment: Fungemia (C parapsilosis) ESRD HD catheter PE+ Recent HCAP DM2  Plan: 1. Continue eraxis 2. Await TEE 3. Repeat BCx in AM 4. Vascular to comment on use of her fistula 5. Ask lab to send Candida for sensi testing to fluconazole.   Comment Would consider stopping vanco/cefepime if TEE+ and consider septic pulm emboli.  C parapsilosis can be R-fluconazole (although less common than C glabrata).    Thank you so much for this interesting consult,  Principal Problem:   Sepsis ([place]) Active Problems:   ESRD (end stage renal disease) ([place])   Anemia   DM (diabetes mellitus), type 2 with renal complications ([place])   Dyspnea   Chest pain   Tachycardia   . [START ON 04/28/2018] calcitRIOL  0.25 mcg Oral QODAY  . chlorhexidine      . heparin  5,000 Units Subcutaneous Q8H  . insulin aspart  5 Units Subcutaneous TID WC  . insulin glargine  5 Units Subcutaneous QHS  . iopamidol      . lidocaine      . metoprolol tartrate  25 mg Oral BID  . multivitamin  1 tablet Oral QHS  . potassium chloride  40 mEq Oral BID  . sodium chloride flush  3 mL Intravenous Q12H    HPI: Rhonda Lynch is a 62 y.o. 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 She was d/c home with ancef at HD as well as augmentin.  Her repeat BCx grew Candida 2/2. She was called to return.  She was started on eraxis as well as vanco/cefepime for HCAP.  Her CXR showed new RLL airspace disease/consolidation. WBC was 7.9. She was afebrile.  She underwent removal of HD cath  today. This was originally placed 02-2018, as was her LUE fistula.  Due to elevated d-dimer, she had a V/Q scan showing PE (2 separate defects).  Review of Systems: Review of Systems  Constitutional: Negative for chills and fever.  Eyes: Negative for blurred vision.  Respiratory: Positive for cough. Negative for sputum production and shortness of breath.        Pleuritic CP  Cardiovascular: Negative for chest pain.  Please see HPI. All other systems reviewed and negative.   Past Medical History:  Diagnosis Date  . Chronic kidney disease   . ESRD (end stage renal disease) ([place])   . Hypertension   . Oxygen deficiency    2 L at night  . Substance abuse ([place])    has been clean for 4 months     Social History   Tobacco Use  . Smoking status: Current Every Day Smoker    Packs/day: 0.50  . Smokeless tobacco: Never Used  . Tobacco comment: trying to quit 04/02/2018  Substance Use  Topics  . Alcohol use: Yes  . Drug use: Not Currently    Types: Cocaine    Family History  Problem Relation Age of Onset  . Autoimmune disease Neg Hx      Medications:  Scheduled: . [START ON 04/28/2018] calcitRIOL  0.25 mcg Oral QODAY  . chlorhexidine      . heparin  2,000 Units Intravenous Once  . insulin aspart  5 Units Subcutaneous TID WC  . insulin glargine  5 Units Subcutaneous QHS  . iopamidol      . lidocaine      . metoprolol tartrate  25 mg Oral BID  . multivitamin  1 tablet Oral QHS  . potassium chloride  40 mEq Oral BID  . sodium chloride flush  3 mL Intravenous Q12H    Abtx:  Anti-infectives (From admission, onward)   Start     Dose/Rate Route Frequency Ordered Stop   04/29/18 1200  vancomycin (VANCOCIN) IVPB 750 mg/150 ml premix     750 mg 150 mL/hr over 60 Minutes Intravenous Every T-Th-Sa (Hemodialysis) 04/26/18 1959     04/27/18 2000  anidulafungin (ERAXIS) 100 mg in sodium chloride 0.9 % 100 mL IVPB     100 mg 78 mL/hr over 100 Minutes Intravenous Every 24 hours  04/26/18 1939     04/26/18 2000  anidulafungin (ERAXIS) 200 mg in sodium chloride 0.9 % 200 mL IVPB     200 mg 78 mL/hr over 200 Minutes Intravenous  Once 04/26/18 1939 04/27/18 0237   04/26/18 2000  vancomycin (VANCOCIN) IVPB 1000 mg/200 mL premix     1,000 mg 200 mL/hr over 60 Minutes Intravenous  Once 04/26/18 1959 04/27/18 0010   04/26/18 2000  ceFEPIme (MAXIPIME) 1 g in sodium chloride 0.9 % 100 mL IVPB     1 g 200 mL/hr over 30 Minutes Intravenous Every 24 hours 04/26/18 1959          OBJECTIVE: Blood pressure 130/85, pulse (!) 103, temperature 98.4 F (36.9 C), temperature source Oral, resp. rate (!) 26, weight 59.5 kg, SpO2 96 %.  Physical Exam  Constitutional: She is oriented to person, place, and time. She appears well-developed and well-nourished. No distress.  HENT:  Mouth/Throat: No oropharyngeal exudate.  Eyes: EOM are normal.  Neck: Normal range of motion. Neck supple.  Cardiovascular: Normal rate, regular rhythm and normal heart sounds.  Pulmonary/Chest: Effort normal and breath sounds normal.  R anterior chest wound site dressed, no surrounding erythema or fluctuance.   Abdominal: Soft. Bowel sounds are normal. She exhibits no distension. There is no tenderness.  Musculoskeletal:  LUE fistula clean, +bruit  Lymphadenopathy:    She has no cervical adenopathy.  Neurological: She is alert and oriented to person, place, and time.    Lab Results Results for orders placed or performed during the hospital encounter of 04/26/18 (from the past 48 hour(s))  Basic metabolic panel     Status: Abnormal   Collection Time: 04/26/18  5:26 PM  Result Value Ref Range   Sodium 138 135 - 145 mmol/L   Potassium 3.9 3.5 - 5.1 mmol/L   Chloride 97 (L) 98 - 111 mmol/L   CO2 25 22 - 32 mmol/L   Glucose, Bld 153 (H) 70 - 99 mg/dL   BUN 8 8 - 23 mg/dL   Creatinine, Ser 3.14 (H) 0.44 - 1.00 mg/dL   Calcium 7.8 (L) 8.9 - 10.3 mg/dL   GFR calc non Af Amer 15 (L) >60 mL/min  GFR  calc Af Amer 17 (L) >60 mL/min    Comment: (NOTE) The eGFR has been calculated using the CKD EPI equation. This calculation has not been validated in all clinical situations. eGFR's persistently <60 mL/min signify possible Chronic Kidney Disease.    Anion gap 16 (H) 5 - 15    Comment: Performed at [facility], [place] [address] [place], [place] [address]  CBC     Status: Abnormal   Collection Time: 04/26/18  5:26 PM  Result Value Ref Range   WBC 7.9 4.0 - 10.5 K/uL   RBC 4.64 3.87 - 5.11 MIL/uL   Hemoglobin 11.5 (L) 12.0 - 15.0 g/dL   HCT 38.1 36.0 - 46.0 %   MCV 82.1 78.0 - 100.0 fL   MCH 24.8 (L) 26.0 - 34.0 pg   MCHC 30.2 30.0 - 36.0 g/dL   RDW 19.6 (H) 11.5 - 15.5 %   Platelets 264 150 - 400 K/uL    Comment: Performed at [facility] [address], [place], [place] [address]  Troponin I (q 6hr x 3)     Status: Abnormal   Collection Time: 04/26/18  5:26 PM  Result Value Ref Range   Troponin I 0.04 (HH) <0.03 ng/mL    Comment: CRITICAL RESULT CALLED TO, READ BACK BY AND VERIFIED WITH: PJ Sexton rn at [facility] 04/26/18 by woollenk Performed at [facility], [address] [place], [place] [address]   Creatinine, serum     Status: Abnormal   Collection Time: 04/26/18  5:26 PM  Result Value Ref Range   Creatinine, Ser 3.19 (H) 0.44 - 1.00 mg/dL   GFR calc non Af Amer 14 (L) >60 mL/min   GFR calc Af Amer 17 (L) >60 mL/min    Comment: (NOTE) The eGFR has been calculated using the CKD EPI equation. This calculation has not been validated in all clinical situations. eGFR's persistently <60 mL/min signify possible Chronic Kidney Disease. Performed at [facility], [place] [address], [place], [place] [address]   I-stat troponin, ED     Status: None   Collection Time: 04/26/18  5:39 PM  Result Value Ref Range   Troponin i, poc 0.06 0.00 - 0.08 ng/mL   Comment 3            Comment: Due to the release kinetics of cTnI, a negative result within the first  hours of the onset of symptoms does not rule out myocardial infarction with certainty. If myocardial infarction is still suspected, repeat the test at appropriate intervals.   I-Stat CG4 Lactic Acid, ED     Status: Abnormal   Collection Time: 04/26/18  5:42 PM  Result Value Ref Range   Lactic Acid, Venous 4.83 (HH) 0.5 - 1.9 mmol/L   Comment NOTIFIED PHYSICIAN   I-Stat CG4 Lactic Acid, ED     Status: Abnormal   Collection Time: 04/26/18  7:41 PM  Result Value Ref Range   Lactic Acid, Venous 1.92 (H) 0.5 - 1.9 mmol/L  D-dimer, quantitative (not at [facility])     Status: Abnormal   Collection Time: 04/26/18  8:18 PM  Result Value Ref Range   D-Dimer, Quant 10.38 (H) 0.00 - 0.50 ug/mL-FEU    Comment: (NOTE) At the manufacturer cut-off of 0.50 ug/mL FEU, this assay has been documented to exclude PE with a sensitivity and negative predictive value of 97 to 99%.  At this time, this assay has not been approved by  the FDA to exclude DVT/VTE. Results should be correlated with clinical presentation. Performed at [facility], [place] [address], [place], [place] [address]   MRSA PCR Screening     Status: None   Collection Time: 04/26/18  8:37 PM  Result Value Ref Range   MRSA by PCR NEGATIVE NEGATIVE    Comment:        The GeneXpert MRSA Assay (FDA approved for NASAL specimens only), is one component of a comprehensive MRSA colonization surveillance program. It is not intended to diagnose MRSA infection nor to guide or monitor treatment for MRSA infections. Performed at [facility], [place] [address], [place], [place] [address]   Glucose, capillary     Status: Abnormal   Collection Time: 04/26/18  9:34 PM  Result Value Ref Range   Glucose-Capillary 117 (H) 70 - 99 mg/dL  Troponin I (q 6hr x 3)     Status: Abnormal   Collection Time: 04/27/18 12:49 AM  Result Value Ref Range   Troponin I 0.07 (HH) <0.03 ng/mL    Comment: CRITICAL VALUE NOTED.  VALUE IS CONSISTENT WITH  PREVIOUSLY REPORTED AND CALLED VALUE. Performed at [facility], [place] [address], [place], [place] [address]   Troponin I (q 6hr x 3)     Status: Abnormal   Collection Time: 04/27/18  7:06 AM  Result Value Ref Range   Troponin I 0.04 (HH) <0.03 ng/mL    Comment: CRITICAL VALUE NOTED.  VALUE IS CONSISTENT WITH PREVIOUSLY REPORTED AND CALLED VALUE. Performed at [facility], [place] [address], [place], [place] [address]   CBC     Status: Abnormal   Collection Time: 04/27/18  7:06 AM  Result Value Ref Range   WBC 4.7 4.0 - 10.5 K/uL   RBC 3.59 (L) 3.87 - 5.11 MIL/uL   Hemoglobin 8.8 (L) 12.0 - 15.0 g/dL    Comment: DELTA CHECK NOTED REPEATED TO VERIFY    HCT 28.6 (L) 36.0 - 46.0 %   MCV 79.7 78.0 - 100.0 fL   MCH 24.5 (L) 26.0 - 34.0 pg   MCHC 30.8 30.0 - 36.0 g/dL   RDW 18.8 (H) 11.5 - 15.5 %   Platelets 237 150 - 400 K/uL    Comment: Performed at [facility], [place] [address] [place],  [address]  Comprehensive metabolic panel     Status: Abnormal   Collection Time: 04/27/18  7:06 AM  Result Value Ref Range   Sodium 138 135 - 145 mmol/L   Potassium 3.0 (L) 3.5 - 5.1 mmol/L   Chloride 97 (L) 98 - 111 mmol/L   CO2 28 22 - 32 mmol/L   Glucose, Bld 119 (H) 70 - 99 mg/dL   BUN 17 8 - 23 mg/dL   Creatinine, Ser 4.30 (H) 0.44 - 1.00 mg/dL   Calcium 7.5 (L) 8.9 - 10.3 mg/dL   Total Protein 5.3 (L) 6.5 - 8.1 g/dL   Albumin 1.6 (L) 3.5 - 5.0 g/dL   AST 28 15 - 41 U/L   ALT 14 0 - 44 U/L   Alkaline Phosphatase 113 38 - 126 U/L   Total Bilirubin 0.6 0.3 - 1.2 mg/dL   GFR calc non Af Amer 10 (L) >60 mL/min   GFR calc Af Amer 12 (L) >60 mL/min    Comment: (NOTE) The eGFR has been calculated using the CKD EPI equation. This calculation has not been validated in all clinical situations. eGFR's persistently <  60 mL/min signify possible Chronic Kidney Disease.    Anion gap 13 5 - 15    Comment: Performed at [facility] [address], [place], [place]  [address]  Magnesium     Status: None   Collection Time: 04/27/18  7:06 AM  Result Value Ref Range   Magnesium 1.7 1.7 - 2.4 mg/dL    Comment: Performed at [facility] [address], [place], [place] [address]  Glucose, capillary     Status: None   Collection Time: 04/27/18  8:21 AM  Result Value Ref Range   Glucose-Capillary 99 70 - 99 mg/dL      Component Value Date/Time   SDES BLOOD RIGHT FOREARM 04/23/2018 1715   SPECREQUEST  04/23/2018 1715    BOTTLES DRAWN AEROBIC AND ANAEROBIC Blood Culture adequate volume   CULT  04/23/2018 1715    NO GROWTH 3 DAYS Performed at [facility], [place] [address], [place], [place] [address]    REPTSTATUS PENDING 04/23/2018 1715   Dg Chest 2 View  Result Date: 04/26/2018 CLINICAL DATA:  Chest pain altered mental status and sepsis. EXAM: CHEST - 2 VIEW COMPARISON:  04/17/2018 and prior radiographs FINDINGS: New RIGHT LOWER lung airspace disease/consolidation likely represents pneumonia. Mild cardiomegaly and RIGHT central venous catheter again noted. No definite pleural effusion or pneumothorax. The LEFT lung is clear. IMPRESSION: RIGHT LOWER lung airspace disease/consolidation likely representing pneumonia. Electronically Signed   By: Margarette Canada M.D.   On: 04/26/2018 18:34   Ir Removal Tun Cv Cath W/o Fl  Result Date: 04/27/2018 INDICATION: 62 year old with end-stage renal disease and tunneled dialysis catheter. Patient has a Candida infection and needs a removal of the dialysis catheter. EXAM: REMOVAL TUNNELED DIALYSIS CATHETER MEDICATIONS: None ANESTHESIA/SEDATION: None FLUOROSCOPY TIME:  None COMPLICATIONS: None immediate. PROCEDURE: Informed written consent was obtained from the patient after a thorough discussion of the procedural risks, benefits and alternatives. All questions were addressed. Maximal Sterile Barrier Technique was utilized including caps, mask, sterile gowns, sterile gloves, sterile drape, hand hygiene and skin  antiseptic. A timeout was performed prior to the initiation of the procedure. [name] right chest and catheter was prepped and draped in a normal sterile fashion. Heparin was removed from both ports of catheter. 1% lidocaine was used for local anesthesia. Using gentle blunt dissection the cuff of the catheter was exposed and the catheter was removed in it's entirety. Pressure was held till hemostasis was obtained. A sterile dressing was applied. In addition, the patient had 2 sutures in the right lower neck which were both removed. The patient tolerated the procedure well with no immediate complications. IMPRESSION: Successful catheter removal as described above. Electronically Signed   By: Markus Daft M.D.   On: 04/27/2018 13:02   Recent Results (from the past 240 hour(s))  Culture, blood (routine x 2)     Status: None   Collection Time: 04/18/18  5:25 AM  Result Value Ref Range Status   Specimen Description BLOOD RIGHT ANTECUBITAL  Final   Special Requests   Final    BOTTLES DRAWN AEROBIC ONLY Blood Culture results may not be optimal due to an inadequate volume of blood received in culture bottles   Culture   Final    NO GROWTH 5 DAYS Performed at [facility] [address], [place], [place] [address]    Report Status 04/23/2018 FINAL  Final  Culture, blood (routine x 2)     Status: Abnormal  Collection Time: 04/18/18  5:30 AM  Result Value Ref Range Status   Specimen Description BLOOD RIGHT HAND  Final   Special Requests   Final    BOTTLES DRAWN AEROBIC ONLY Blood Culture results may not be optimal due to an inadequate volume of blood received in culture bottles   Culture  Setup Time   Final    GRAM POSITIVE COCCI IN CLUSTERS AEROBIC BOTTLE ONLY CRITICAL RESULT CALLED TO, READ BACK BY AND VERIFIED WITH: M. PHAM, RPHARMD AT 1735 ON 04/19/18 BY C. JESSUP, MLT.    Culture (A)  Final    MICROCOCCUS SPECIES Standardized susceptibility testing for this organism is not  available. Performed at [facility], [place] [address], [place], [place] [address]    Report Status 04/20/2018 FINAL  Final  Blood Culture ID Panel (Reflexed)     Status: None   Collection Time: 04/18/18  5:30 AM  Result Value Ref Range Status   Enterococcus species NOT DETECTED NOT DETECTED Final   Listeria monocytogenes NOT DETECTED NOT DETECTED Final   Staphylococcus species NOT DETECTED NOT DETECTED Final   Staphylococcus aureus NOT DETECTED NOT DETECTED Final   Streptococcus species NOT DETECTED NOT DETECTED Final   Streptococcus agalactiae NOT DETECTED NOT DETECTED Final   Streptococcus pneumoniae NOT DETECTED NOT DETECTED Final   Streptococcus pyogenes NOT DETECTED NOT DETECTED Final   Acinetobacter baumannii NOT DETECTED NOT DETECTED Final   Enterobacteriaceae species NOT DETECTED NOT DETECTED Final   Enterobacter cloacae complex NOT DETECTED NOT DETECTED Final   Escherichia coli NOT DETECTED NOT DETECTED Final   Klebsiella oxytoca NOT DETECTED NOT DETECTED Final   Klebsiella pneumoniae NOT DETECTED NOT DETECTED Final   Proteus species NOT DETECTED NOT DETECTED Final   Serratia marcescens NOT DETECTED NOT DETECTED Final   Haemophilus influenzae NOT DETECTED NOT DETECTED Final   Neisseria meningitidis NOT DETECTED NOT DETECTED Final   Pseudomonas aeruginosa NOT DETECTED NOT DETECTED Final   Candida albicans NOT DETECTED NOT DETECTED Final   Candida glabrata NOT DETECTED NOT DETECTED Final   Candida krusei NOT DETECTED NOT DETECTED Final   Candida parapsilosis NOT DETECTED NOT DETECTED Final   Candida tropicalis NOT DETECTED NOT DETECTED Final    Comment: Performed at [facility], [place] [address] [place], [place] [address]  Culture, Urine     Status: Abnormal   Collection Time: 04/18/18  8:42 AM  Result Value Ref Range Status   Specimen Description URINE, CLEAN CATCH  Final   Special Requests   Final    NONE Performed at [facility], [place] [address],  [place], [place] [address]    Culture MULTIPLE SPECIES PRESENT, SUGGEST RECOLLECTION (A)  Final   Report Status 04/19/2018 FINAL  Final  Culture, blood (routine x 2)     Status: Abnormal   Collection Time: 04/23/18  5:00 PM  Result Value Ref Range Status   Specimen Description BLOOD RIGHT ANTECUBITAL  Final   Special Requests   Final    BOTTLES DRAWN AEROBIC AND ANAEROBIC Blood Culture adequate volume   Culture  Setup Time   Final    YEAST AEROBIC BOTTLE ONLY CRITICAL RESULT CALLED TO, READ BACK BY AND VERIFIED WITH: DR Sloan Leiter [id number] MLM Performed at [facility], [place] [address], [place], [place] [address]    Culture CANDIDA PARAPSILOSIS (A)  Final   Report Status 04/27/2018 FINAL  Final  Blood Culture ID Panel (Reflexed)  Status: Abnormal   Collection Time: 04/23/18  5:00 PM  Result Value Ref Range Status   Enterococcus species NOT DETECTED NOT DETECTED Final   Listeria monocytogenes NOT DETECTED NOT DETECTED Final   Staphylococcus species NOT DETECTED NOT DETECTED Final   Staphylococcus aureus NOT DETECTED NOT DETECTED Final   Streptococcus species NOT DETECTED NOT DETECTED Final   Streptococcus agalactiae NOT DETECTED NOT DETECTED Final   Streptococcus pneumoniae NOT DETECTED NOT DETECTED Final   Streptococcus pyogenes NOT DETECTED NOT DETECTED Final   Acinetobacter baumannii NOT DETECTED NOT DETECTED Final   Enterobacteriaceae species NOT DETECTED NOT DETECTED Final   Enterobacter cloacae complex NOT DETECTED NOT DETECTED Final   Escherichia coli NOT DETECTED NOT DETECTED Final   Klebsiella oxytoca NOT DETECTED NOT DETECTED Final   Klebsiella pneumoniae NOT DETECTED NOT DETECTED Final   Proteus species NOT DETECTED NOT DETECTED Final   Serratia marcescens NOT DETECTED NOT DETECTED Final   Haemophilus influenzae NOT DETECTED NOT DETECTED Final   Neisseria meningitidis NOT DETECTED NOT DETECTED Final   Pseudomonas aeruginosa NOT DETECTED NOT DETECTED Final   Candida  albicans NOT DETECTED NOT DETECTED Final   Candida glabrata NOT DETECTED NOT DETECTED Final   Candida krusei NOT DETECTED NOT DETECTED Final   Candida parapsilosis DETECTED (A) NOT DETECTED Final    Comment: CRITICAL RESULT CALLED TO, READ BACK BY AND VERIFIED WITH: DR Sloan Leiter [id number] MLM    Candida tropicalis NOT DETECTED NOT DETECTED Final    Comment: Performed at [facility], [address] [place], [place] [address]  Culture, blood (routine x 2)     Status: None (Preliminary result)   Collection Time: 04/23/18  5:15 PM  Result Value Ref Range Status   Specimen Description BLOOD RIGHT FOREARM  Final   Special Requests   Final    BOTTLES DRAWN AEROBIC AND ANAEROBIC Blood Culture adequate volume   Culture   Final    NO GROWTH 3 DAYS Performed at [facility], [place] [address] [place], [place] [address]    Report Status PENDING  Incomplete  MRSA PCR Screening     Status: None   Collection Time: 04/26/18  8:37 PM  Result Value Ref Range Status   MRSA by PCR NEGATIVE NEGATIVE Final    Comment:        The GeneXpert MRSA Assay (FDA approved for NASAL specimens only), is one component of a comprehensive MRSA colonization surveillance program. It is not intended to diagnose MRSA infection nor to guide or monitor treatment for MRSA infections. Performed at [facility], [place] [address] [place], [place] [address]     Microbiology: Recent Results (from the past 240 hour(s))  Culture, blood (routine x 2)     Status: None   Collection Time: 04/18/18  5:25 AM  Result Value Ref Range Status   Specimen Description BLOOD RIGHT ANTECUBITAL  Final   Special Requests   Final    BOTTLES DRAWN AEROBIC ONLY Blood Culture results may not be optimal due to an inadequate volume of blood received in culture bottles   Culture   Final    NO GROWTH 5 DAYS Performed at [facility], [place] [address] [place], [place] [address]    Report Status 04/23/2018 FINAL  Final    Culture, blood (routine x 2)     Status: Abnormal   Collection Time: 04/18/18  5:30 AM  Result Value Ref Range Status   Specimen Description  BLOOD RIGHT HAND  Final   Special Requests   Final    BOTTLES DRAWN AEROBIC ONLY Blood Culture results may not be optimal due to an inadequate volume of blood received in culture bottles   Culture  Setup Time   Final    GRAM POSITIVE COCCI IN CLUSTERS AEROBIC BOTTLE ONLY CRITICAL RESULT CALLED TO, READ BACK BY AND VERIFIED WITH: M. PHAM, RPHARMD AT 1735 ON 04/19/18 BY C. JESSUP, MLT.    Culture (A)  Final    MICROCOCCUS SPECIES Standardized susceptibility testing for this organism is not available. Performed at [facility], [place] [address] [place], [place] [address]    Report Status 04/20/2018 FINAL  Final  Blood Culture ID Panel (Reflexed)     Status: None   Collection Time: 04/18/18  5:30 AM  Result Value Ref Range Status   Enterococcus species NOT DETECTED NOT DETECTED Final   Listeria monocytogenes NOT DETECTED NOT DETECTED Final   Staphylococcus species NOT DETECTED NOT DETECTED Final   Staphylococcus aureus NOT DETECTED NOT DETECTED Final   Streptococcus species NOT DETECTED NOT DETECTED Final   Streptococcus agalactiae NOT DETECTED NOT DETECTED Final   Streptococcus pneumoniae NOT DETECTED NOT DETECTED Final   Streptococcus pyogenes NOT DETECTED NOT DETECTED Final   Acinetobacter baumannii NOT DETECTED NOT DETECTED Final   Enterobacteriaceae species NOT DETECTED NOT DETECTED Final   Enterobacter cloacae complex NOT DETECTED NOT DETECTED Final   Escherichia coli NOT DETECTED NOT DETECTED Final   Klebsiella oxytoca NOT DETECTED NOT DETECTED Final   Klebsiella pneumoniae NOT DETECTED NOT DETECTED Final   Proteus species NOT DETECTED NOT DETECTED Final   Serratia marcescens NOT DETECTED NOT DETECTED Final   Haemophilus influenzae NOT DETECTED NOT DETECTED Final   Neisseria meningitidis NOT DETECTED NOT DETECTED Final    Pseudomonas aeruginosa NOT DETECTED NOT DETECTED Final   Candida albicans NOT DETECTED NOT DETECTED Final   Candida glabrata NOT DETECTED NOT DETECTED Final   Candida krusei NOT DETECTED NOT DETECTED Final   Candida parapsilosis NOT DETECTED NOT DETECTED Final   Candida tropicalis NOT DETECTED NOT DETECTED Final    Comment: Performed at [facility], [place] [address], [place], [place] [address]  Culture, Urine     Status: Abnormal   Collection Time: 04/18/18  8:42 AM  Result Value Ref Range Status   Specimen Description URINE, CLEAN CATCH  Final   Special Requests   Final    NONE Performed at [facility], [place] [address], [place], [place] [address]    Culture MULTIPLE SPECIES PRESENT, SUGGEST RECOLLECTION (A)  Final   Report Status 04/19/2018 FINAL  Final  Culture, blood (routine x 2)     Status: Abnormal   Collection Time: 04/23/18  5:00 PM  Result Value Ref Range Status   Specimen Description BLOOD RIGHT ANTECUBITAL  Final   Special Requests   Final    BOTTLES DRAWN AEROBIC AND ANAEROBIC Blood Culture adequate volume   Culture  Setup Time   Final    YEAST AEROBIC BOTTLE ONLY CRITICAL RESULT CALLED TO, READ BACK BY AND VERIFIED WITH: DR Sloan Leiter [phone] MLM Performed at [facility], [place] [address] [place], [place] [address]    Culture CANDIDA PARAPSILOSIS (A)  Final   Report Status 04/27/2018 FINAL  Final  Blood Culture ID Panel (Reflexed)     Status: Abnormal   Collection Time: 04/23/18  5:00 PM  Result Value Ref Range Status  Enterococcus species NOT DETECTED NOT DETECTED Final   Listeria monocytogenes NOT DETECTED NOT DETECTED Final   Staphylococcus species NOT DETECTED NOT DETECTED Final   Staphylococcus aureus NOT DETECTED NOT DETECTED Final   Streptococcus species NOT DETECTED NOT DETECTED Final   Streptococcus agalactiae NOT DETECTED NOT DETECTED Final   Streptococcus pneumoniae NOT DETECTED NOT DETECTED Final   Streptococcus pyogenes NOT  DETECTED NOT DETECTED Final   Acinetobacter baumannii NOT DETECTED NOT DETECTED Final   Enterobacteriaceae species NOT DETECTED NOT DETECTED Final   Enterobacter cloacae complex NOT DETECTED NOT DETECTED Final   Escherichia coli NOT DETECTED NOT DETECTED Final   Klebsiella oxytoca NOT DETECTED NOT DETECTED Final   Klebsiella pneumoniae NOT DETECTED NOT DETECTED Final   Proteus species NOT DETECTED NOT DETECTED Final   Serratia marcescens NOT DETECTED NOT DETECTED Final   Haemophilus influenzae NOT DETECTED NOT DETECTED Final   Neisseria meningitidis NOT DETECTED NOT DETECTED Final   Pseudomonas aeruginosa NOT DETECTED NOT DETECTED Final   Candida albicans NOT DETECTED NOT DETECTED Final   Candida glabrata NOT DETECTED NOT DETECTED Final   Candida krusei NOT DETECTED NOT DETECTED Final   Candida parapsilosis DETECTED (A) NOT DETECTED Final    Comment: CRITICAL RESULT CALLED TO, READ BACK BY AND VERIFIED WITH: DR Sloan Leiter [phone] MLM    Candida tropicalis NOT DETECTED NOT DETECTED Final    Comment: Performed at [facility], [address] [place], [place] [address]  Culture, blood (routine x 2)     Status: None (Preliminary result)   Collection Time: 04/23/18  5:15 PM  Result Value Ref Range Status   Specimen Description BLOOD RIGHT FOREARM  Final   Special Requests   Final    BOTTLES DRAWN AEROBIC AND ANAEROBIC Blood Culture adequate volume   Culture   Final    NO GROWTH 3 DAYS Performed at [facility], [place] [address] [place], [place] [address]    Report Status PENDING  Incomplete  MRSA PCR Screening     Status: None   Collection Time: 04/26/18  8:37 PM  Result Value Ref Range Status   MRSA by PCR NEGATIVE NEGATIVE Final    Comment:        The GeneXpert MRSA Assay (FDA approved for NASAL specimens only), is one component of a comprehensive MRSA colonization surveillance program. It is not intended to diagnose MRSA infection nor to guide or monitor  treatment for MRSA infections. Performed at [facility], [place] [address] [place], [place] [address]     Radiographs and labs were personally reviewed by me.   Bobby Rumpf, MD [facility] [phone] 04/27/2018, 1:07 PM

## 2018-04-27 NOTE — Progress Notes (Signed)
RN paged Jeannette Corpus, NP to make her aware patient's troponin is 0.07, awaiting response. P.J. Linus Mako, RN

## 2018-04-27 NOTE — Procedures (Addendum)
  Pre-operative Diagnosis: Candida infection with a HD catheter       Post-operative Diagnosis: Candida infection with a HD catheter   Indications: Need HD catheter to be removed  Procedure: Removal of tunneled HD catheter  Findings: Complete removal of HD catheter and cuff.  No suspicious drainage from subcutaneous tunnel  Complications: None     EBL: Minimal  Plan: Maturing AV fistula. Await surgery evaluation of AV fistula.

## 2018-04-27 NOTE — Progress Notes (Signed)
Patient c/o or right flank and abdominal pain, Levsin had been prescribed earlier for pain with no relief.  RN paged Jeannette Corpus, NP to make her aware, awaiting response.  P.J. Linus Mako, RN

## 2018-04-27 NOTE — Progress Notes (Addendum)
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  She was recently discharged from here after being treated for pneumonia.  Patient was also complaining of some shortness of breath and chest pain at home.  Patient admitted for further management.  Currently she is  Being managed  for Candida fungemia, healthcare associated pneumonia.  Cardiology, nephrology and ID are following.  Assessment & Plan:   Principal Problem:   Sepsis ([place]) Active Problems:   ESRD (end stage renal disease) ([place])   Anemia   DM (diabetes mellitus), type 2 with renal complications (HCC)   Dyspnea   Chest pain   Tachycardia   Fungemia   Pulmonary embolism (H[facility])   Candida fungemia: Blood cultures that were drawn on her last visit showed Candida parapsilosis.  Started on Eraxis.  ID following.  Hemodialysis  catheter removed.  PE/Elevated d-dimer:  D-dimer found to be elevated.  She is not a candidate for CT angios.  We have ordered for VQ scan which showed hyper probability of PE.  Started on heparin drip.  She might need to be transitioned to Coumadin at some point.Will follow up with cardiology.  Healthcare associated pneumonia: Was discharged on IV cefazolin and Augmentin.  Currently she is afebrile and hemodynamically stable.  Started on Vanco and cefepime on this admission.  Chest x-ray done here showed RIGHT LOWER lung airspace disease/consolidation likely representing pneumonia.  ESRD on dialysis: Nephrology following.  Permanent dialysis catheter removed.  She has AV fistula placed in left upper extremity on 02/2618.  Nephrology planning to use that AV fistula.   Chest pain:  Currently resolved.  Most likely atypical chest pain.  Cardiology has been following.  Troponin mild elevated and has not trended up.  Dose of metoprolol increased.  Echocardiogram has been ordered.  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  Currently H&H is stable.  Thrombocytopenia: Chronic  Diabetes type 2: Continue current regimen.  We will continue to monitor CBC.  History of BOOP: Completed a course of steroids.  Hypokalemia: Being supplemented with potassium.  We will continue to monitor the levels.  Lactic acidosis: Mild.     DVT prophylaxis:Heparin  Code Status: Full Family Communication: None present at the bedside Disposition Plan: Pending further work-up   Consultants: Nephrology, cardiology, IR, ID  Procedures: Removal of HD catheter  Antimicrobials: Eraxis, vancomycin and cefepime  Subjective: Patient seen and examined the bedside this morning.  Currently her shortness of breath and chest pain have resolved.  She feels slightly better this morning.  Still mildly tachycardic.  Afebrile.  Complains of  Cough.  Objective: Vitals:   04/27/18 0100 04/27/18 0124 04/27/18 0407 04/27/18 0800  BP:  119/70 121/85 130/85  Pulse:    (!) 103  Resp:  (!) 24 (!) 24 (!) 26  Temp: 98.6 F (37 C) 98.6 F (37 C) 98.4 F (36.9 C) 98.4 F (36.9 C)  TempSrc:  Oral Oral Oral  SpO2:  98% 98% 96%  Weight:   59.5 kg     Intake/Output Summary (Last 24 hours) at 04/27/2018 1443 Last data filed at 04/27/2018 1100 Gross per 24 hour  Intake 677.09 ml  Output -  Net 677.09 ml   Filed Weights   04/27/18 0407  Weight: 59.5 kg    Examination:  General exam: Not in distress, generalized weakness HEENT:PERRL,Oral mucosa moist, Ear/Nose normal on gross exam Respiratory system: Crackles on the right base , decreased air entry bilaterally on the bases cardiovascular system:Sinus tachycardia, S1 & S2 heard, RRR. No JVD, murmurs,  rubs, gallops or clicks. Trace pedal edema. Gastrointestinal system: Abdomen is nondistended, soft and nontender. No organomegaly or masses felt. Normal bowel sounds heard. Central nervous system: Alert and oriented. No focal neurological deficits. Extremities: trace edema, no clubbing ,no cyanosis, distal peripheral pulses palpable. Skin: No rashes, lesions or ulcers,no icterus ,no pallor MSK: Normal muscle bulk,tone ,power Psychiatry: Judgement and insight appear normal. Mood & affect appropriate.     Data Reviewed: I have personally reviewed following labs and imaging studies  CBC: Recent Labs  Lab 04/22/18 0810 04/24/18 0621 04/26/18 1726 04/27/18 0706  WBC 6.1 4.1 7.9 4.7  HGB 8.0* 8.2* 11.5* 8.8*  HCT 25.5* 26.5* 38.1 28.6*  MCV 82.0 80.8 82.1 79.7  PLT 102* 150 264 620   Basic Metabolic Panel: Recent Labs  Lab 04/21/18 0444 04/22/18 0815 04/24/18 0621 04/26/18 1726 04/27/18 0706  NA 134* 135 138 138 138  K 4.0 4.1 3.9 3.9 3.0*  CL 96* 98 103 97* 97*  CO2 23 26 25 25 28   GLUCOSE 148* 133* 121* 153* 119*  BUN 39* 51* 34* 8 17  CREATININE 4.52* 6.10* 5.88* 3.19*  3.14* 4.30*  CALCIUM 7.8* 7.5* 7.7* 7.8* 7.5*  MG  --   --   --   --  1.7  PHOS  --  6.3* 5.2*  --   --    GFR: Estimated Creatinine Clearance: 11 mL/min (A) (by C-G formula based on SCr of 4.3 mg/dL (H)). Liver Function Tests: Recent Labs  Lab 04/22/18 0815 04/24/18 0621 04/27/18 0706  AST  --   --  28  ALT  --   --  14  ALKPHOS  --   --  113  BILITOT  --   --  0.6  PROT  --   --  5.3*  ALBUMIN 1.6* 1.5* 1.6*   No results for input(s): LIPASE, AMYLASE in the last 168 hours. No results for input(s): AMMONIA in the last 168 hours. Coagulation Profile: No results for input(s): INR, PROTIME in the last 168 hours. Cardiac Enzymes: Recent Labs  Lab 04/26/18 1726 04/27/18 0049 04/27/18 0706  TROPONINI 0.04* 0.07* 0.04*   BNP (last 3 results) No results for input(s): PROBNP in the last  8760 hours. HbA1C: No results for input(s): HGBA1C in the last 72 hours. CBG: Recent Labs  Lab 04/25/18 1207 04/25/18 1636 04/26/18 2134 04/27/18 0821 04/27/18 1435  GLUCAP 169* 190* 117* 99 142*   Lipid Profile: No results for input(s): CHOL, HDL, LDLCALC, TRIG, CHOLHDL, LDLDIRECT in the last 72 hours. Thyroid Function Tests: No results for input(s): TSH, T4TOTAL, FREET4, T3FREE, THYROIDAB in the last 72 hours. Anemia Panel: No results for input(s): VITAMINB12, FOLATE, FERRITIN, TIBC, IRON, RETICCTPCT in the last 72 hours. Sepsis Labs: Recent Labs  Lab 04/26/18 1742 04/26/18 1941  LATICACIDVEN 4.83* 1.92*    Recent Results (from the past 240 hour(s))  Culture, blood (routine x 2)     Status: None   Collection Time: 04/18/18  5:25 AM  Result Value Ref Range Status   Specimen Description BLOOD RIGHT ANTECUBITAL  Final   Special Requests   Final  BOTTLES DRAWN AEROBIC ONLY Blood Culture results may not be optimal due to an inadequate volume of blood received in culture bottles   Culture   Final    NO GROWTH 5 DAYS Performed at [facility], [place] [address], [place], [place] [address]    Report Status 04/23/2018 FINAL  Final  Culture, blood (routine x 2)     Status: Abnormal   Collection Time: 04/18/18  5:30 AM  Result Value Ref Range Status   Specimen Description BLOOD RIGHT HAND  Final   Special Requests   Final    BOTTLES DRAWN AEROBIC ONLY Blood Culture results may not be optimal due to an inadequate volume of blood received in culture bottles   Culture  Setup Time   Final    GRAM POSITIVE COCCI IN CLUSTERS AEROBIC BOTTLE ONLY CRITICAL RESULT CALLED TO, READ BACK BY AND VERIFIED WITH: M. PHAM, RPHARMD AT 1735 ON 04/19/18 BY C. JESSUP, MLT.    Culture (A)  Final    MICROCOCCUS SPECIES Standardized susceptibility testing for this organism is not available. Performed at [facility], [place] [address], [place], [place] [address]    Report Status  04/20/2018 FINAL  Final  Blood Culture ID Panel (Reflexed)     Status: None   Collection Time: 04/18/18  5:30 AM  Result Value Ref Range Status   Enterococcus species NOT DETECTED NOT DETECTED Final   Listeria monocytogenes NOT DETECTED NOT DETECTED Final   Staphylococcus species NOT DETECTED NOT DETECTED Final   Staphylococcus aureus NOT DETECTED NOT DETECTED Final   Streptococcus species NOT DETECTED NOT DETECTED Final   Streptococcus agalactiae NOT DETECTED NOT DETECTED Final   Streptococcus pneumoniae NOT DETECTED NOT DETECTED Final   Streptococcus pyogenes NOT DETECTED NOT DETECTED Final   Acinetobacter baumannii NOT DETECTED NOT DETECTED Final   Enterobacteriaceae species NOT DETECTED NOT DETECTED Final   Enterobacter cloacae complex NOT DETECTED NOT DETECTED Final   Escherichia coli NOT DETECTED NOT DETECTED Final   Klebsiella oxytoca NOT DETECTED NOT DETECTED Final   Klebsiella pneumoniae NOT DETECTED NOT DETECTED Final   Proteus species NOT DETECTED NOT DETECTED Final   Serratia marcescens NOT DETECTED NOT DETECTED Final   Haemophilus influenzae NOT DETECTED NOT DETECTED Final   Neisseria meningitidis NOT DETECTED NOT DETECTED Final   Pseudomonas aeruginosa NOT DETECTED NOT DETECTED Final   Candida albicans NOT DETECTED NOT DETECTED Final   Candida glabrata NOT DETECTED NOT DETECTED Final   Candida krusei NOT DETECTED NOT DETECTED Final   Candida parapsilosis NOT DETECTED NOT DETECTED Final   Candida tropicalis NOT DETECTED NOT DETECTED Final    Comment: Performed at [facility], [place] [address] [place], [place] [address]  Culture, Urine     Status: Abnormal   Collection Time: 04/18/18  8:42 AM  Result Value Ref Range Status   Specimen Description URINE, CLEAN CATCH  Final   Special Requests   Final    NONE Performed at [facility], [place] [address], [place],  [address]    Culture MULTIPLE SPECIES PRESENT, SUGGEST RECOLLECTION (A)  Final   Report  Status 04/19/2018 FINAL  Final  Culture, blood (routine x 2)     Status: Abnormal   Collection Time: 04/23/18  5:00 PM  Result Value Ref Range Status   Specimen Description BLOOD RIGHT ANTECUBITAL  Final   Special Requests   Final    BOTTLES DRAWN AEROBIC AND ANAEROBIC Blood Culture adequate volume  Culture  Setup Time   Final    YEAST AEROBIC BOTTLE ONLY CRITICAL RESULT CALLED TO, READ BACK BY AND VERIFIED WITH: DR Sloan Leiter [phone] MLM Performed at [facility], [place] [address], [place], [place] [address]    Culture CANDIDA PARAPSILOSIS (A)  Final   Report Status 04/27/2018 FINAL  Final  Blood Culture ID Panel (Reflexed)     Status: Abnormal   Collection Time: 04/23/18  5:00 PM  Result Value Ref Range Status   Enterococcus species NOT DETECTED NOT DETECTED Final   Listeria monocytogenes NOT DETECTED NOT DETECTED Final   Staphylococcus species NOT DETECTED NOT DETECTED Final   Staphylococcus aureus NOT DETECTED NOT DETECTED Final   Streptococcus species NOT DETECTED NOT DETECTED Final   Streptococcus agalactiae NOT DETECTED NOT DETECTED Final   Streptococcus pneumoniae NOT DETECTED NOT DETECTED Final   Streptococcus pyogenes NOT DETECTED NOT DETECTED Final   Acinetobacter baumannii NOT DETECTED NOT DETECTED Final   Enterobacteriaceae species NOT DETECTED NOT DETECTED Final   Enterobacter cloacae complex NOT DETECTED NOT DETECTED Final   Escherichia coli NOT DETECTED NOT DETECTED Final   Klebsiella oxytoca NOT DETECTED NOT DETECTED Final   Klebsiella pneumoniae NOT DETECTED NOT DETECTED Final   Proteus species NOT DETECTED NOT DETECTED Final   Serratia marcescens NOT DETECTED NOT DETECTED Final   Haemophilus influenzae NOT DETECTED NOT DETECTED Final   Neisseria meningitidis NOT DETECTED NOT DETECTED Final   Pseudomonas aeruginosa NOT DETECTED NOT DETECTED Final   Candida albicans NOT DETECTED NOT DETECTED Final   Candida glabrata NOT DETECTED NOT DETECTED Final    Candida krusei NOT DETECTED NOT DETECTED Final   Candida parapsilosis DETECTED (A) NOT DETECTED Final    Comment: CRITICAL RESULT CALLED TO, READ BACK BY AND VERIFIED WITH: DR Sloan Leiter [phone] MLM    Candida tropicalis NOT DETECTED NOT DETECTED Final    Comment: Performed at [facility], [address], [place], [place] [address]  Culture, blood (routine x 2)     Status: None (Preliminary result)   Collection Time: 04/23/18  5:15 PM  Result Value Ref Range Status   Specimen Description BLOOD RIGHT FOREARM  Final   Special Requests   Final    BOTTLES DRAWN AEROBIC AND ANAEROBIC Blood Culture adequate volume   Culture   Final    NO GROWTH 3 DAYS Performed at [facility], [place] [name], [place], [place] [address]    Report Status PENDING  Incomplete  MRSA PCR Screening     Status: None   Collection Time: 04/26/18  8:37 PM  Result Value Ref Range Status   MRSA by PCR NEGATIVE NEGATIVE Final    Comment:        The GeneXpert MRSA Assay (FDA approved for NASAL specimens only), is one component of a comprehensive MRSA colonization surveillance program. It is not intended to diagnose MRSA infection nor to guide or monitor treatment for MRSA infections. Performed at [facility], [place] [address], [place], [place] [address]          Radiology Studies: Dg Chest 2 View  Result Date: 04/26/2018 CLINICAL DATA:  Chest pain altered mental status and sepsis. EXAM: CHEST - 2 VIEW COMPARISON:  04/17/2018 and prior radiographs FINDINGS: New RIGHT LOWER lung airspace disease/consolidation likely represents pneumonia. Mild cardiomegaly and RIGHT central venous catheter again noted. No definite pleural effusion or pneumothorax. The LEFT lung is clear. IMPRESSION: RIGHT LOWER lung airspace disease/consolidation likely  representing pneumonia. Electronically Signed   By: Margarette Canada M.D.   On: 04/26/2018 18:34   Nm Pulmonary Perf And Vent  Result Date: 04/27/2018 CLINICAL  DATA:  Shortness of breath EXAM: NUCLEAR MEDICINE VENTILATION - PERFUSION LUNG SCAN VIEWS: Anterior, posterior, left lateral, right lateral, RAO, LAO, RPO, LPO-ventilation perfusion RADIOPHARMACEUTICALS:  32.3 mCi of Tc-93m DTPA aerosol inhalation and 4.2 mCi Tc39m-MAA IV COMPARISON:  Ventilation perfusion lung scans March 20, 2018; chest radiograph April 26, 2018 FINDINGS: Ventilation: The distribution of uptake on the ventilation study is homogeneous and symmetric without appreciable change from prior study. Perfusion: There is a focal perfusion defect in the superior segment of the left lower lobe, not present on prior lung scan examination. There is also a perfusion defect in the anterior segment of the left lower lobe, not present on prior lung scan examination. There are no corresponding ventilation defects or left lower lobe chest radiographic abnormality. No perfusion defects are noted elsewhere. IMPRESSION: There are 2 segmental level perfusion defects in the left lower lobe which were not present on prior lung scan and are not appreciable on the ventilation study. This study therefore constitutes a high probability of pulmonary embolus based on PIOPED II criteria. Critical Value/emergent results were called by telephone at the time of interpretation on 04/27/2018 at 2:37 pm to Dr. Shelly Coss , who verbally acknowledged these results. Electronically Signed   By: Lowella Grip III M.D.   On: 04/27/2018 14:38   Ir Removal Tun Cv Cath W/o Fl  Result Date: 04/27/2018 INDICATION: 62 year old with end-stage renal disease and tunneled dialysis catheter. Patient has a Candida infection and needs a removal of the dialysis catheter. EXAM: REMOVAL TUNNELED DIALYSIS CATHETER MEDICATIONS: None ANESTHESIA/SEDATION: None FLUOROSCOPY TIME:  None COMPLICATIONS: None immediate. PROCEDURE: Informed written consent was obtained from the patient after a thorough discussion of the procedural risks, benefits and  alternatives. All questions were addressed. Maximal Sterile Barrier Technique was utilized including caps, mask, sterile gowns, sterile gloves, sterile drape, hand hygiene and skin antiseptic. A timeout was performed prior to the initiation of the procedure. [name] right chest and catheter was prepped and draped in a normal sterile fashion. Heparin was removed from both ports of catheter. 1% lidocaine was used for local anesthesia. Using gentle blunt dissection the cuff of the catheter was exposed and the catheter was removed in it's entirety. Pressure was held till hemostasis was obtained. A sterile dressing was applied. In addition, the patient had 2 sutures in the right lower neck which were both removed. The patient tolerated the procedure well with no immediate complications. IMPRESSION: Successful catheter removal as described above. Electronically Signed   By: Markus Daft M.D.   On: 04/27/2018 13:02        Scheduled Meds: . [START ON 04/28/2018] calcitRIOL  0.25 mcg Oral QODAY  . chlorhexidine      . insulin aspart  5 Units Subcutaneous TID WC  . insulin glargine  5 Units Subcutaneous QHS  . iopamidol      . lidocaine      . metoprolol tartrate  25 mg Oral BID  . multivitamin  1 tablet Oral QHS  . potassium chloride  40 mEq Oral BID  . sodium chloride flush  3 mL Intravenous Q12H   Continuous Infusions: . sodium chloride 250 mL (04/26/18 2315)  . anidulafungin    . ceFEPime (MAXIPIME) IV 1 g (04/26/18 2146)  . [START ON 04/29/2018] vancomycin  LOS: 1 day    Time spent: 35 mins.More than 50% of that time was spent in counseling and/or coordination of care.      Shelly Coss, MD [facility] Pager [phone]  If 7PM-7AM, please contact night-coverage [email or web address] Password [facility] 04/27/2018, 2:43 PM

## 2018-04-27 NOTE — Progress Notes (Signed)
ANTICOAGULATION CONSULT NOTE - Initial Consult  Pharmacy Consult for Heparin Indication: pulmonary embolus  No Known Allergies  Patient Measurements: Weight: 131 lb 2.8 oz (59.5 kg)  IBW 47.8 kg Heparin Dosing Weight: 59.5 kg  Vital Signs: Temp: 98.4 F (36.9 C) (08/18 0800) Temp Source: Oral (08/18 0800) BP: 130/85 (08/18 0800) Pulse Rate: 103 (08/18 0800)  Labs: Recent Labs    04/26/18 1726 04/27/18 0049 04/27/18 0706  HGB 11.5*  --  8.8*  HCT 38.1  --  28.6*  PLT 264  --  237  CREATININE 3.19*  3.14*  --  4.30*  TROPONINI 0.04* 0.07* 0.04*    Estimated Creatinine Clearance: 11 mL/min (A) (by C-G formula based on SCr of 4.3 mg/dL (H)).   Medical History: Past Medical History:  Diagnosis Date  . Chronic kidney disease   . ESRD (end stage renal disease) ([place])   . Hypertension   . Oxygen deficiency    2 L at night  . 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 Now with further work-up also revealing a new acute pulmonary emboli. Pharmacy consulted to start Heparin for anticoagulation.   Heparin wt: 59.5 kg, Hgb 8.8, plts 237. Noted recent AVF placement followed by a small hematoma evacuation on 03/05/18-03/06/18. Also noted hx pulmonary hemorrhage vs BOOP also in June 2019. Will give a slightly lower bolus given this recent history.   Goal of Therapy:  Heparin level 0.3-0.7 units/ml Monitor platelets by anticoagulation protocol: Yes   Plan:  - Heparin 2000 unit bolus x 1 - Start Heparin at 950 units/hr (9.5 ml/hr) - Will continue to monitor for any signs/symptoms of bleeding and will follow up with heparin level in 8 hours   Thank you for allowing pharmacy to be a part of this patient's care.  Alycia Rossetti, PharmD, BCPS Clinical Pharmacist Pager: [phone] Clinical phone for 04/27/2018 from 7a-3:30p:  [phone] If after 3:30p, please call main pharmacy at: [phone] Please check AMION for all [place] numbers 04/27/2018 3:07 PM

## 2018-04-27 NOTE — Consult Note (Addendum)
Reason for Consult:   Chest pain  Requesting Physician: Dr Maudie Mercury Primary Cardiologist Dr Gwenlyn Found  HPI:  62 y/o female we are asked to see for chest pain.   Ms Rhonda Lynch has a history of HTN, DM, chronic respiratory failure on O2 HS, ESRD-on HD for 3 months, and substance abuse. Dr Gwenlyn Found saw her in consult 04/03/18 for PSVT in the setting of acute respiratory failure and + UDS for cocaine. Echo then showed an EF of 50-55%, severe LAE, mild to moderate MR. We have not seen her in follow up.  She presented 04/26/18 with complaints of Rt sided chest pain after coughing. Her symptoms are worse with breathing and movement. Her Rt posterior chest wall is tender to palpation. Her discomfort did radiate around to her epigastric area- again worse with deep breathing. CXR on admission suggested RLL pneumonia, WBC 7.9, temp 99. Her D-dimer also came back 10.38 and troponin 0.07.    PMHx:  Past Medical History:  Diagnosis Date  . Chronic kidney disease   . ESRD (end stage renal disease) ([place])   . Hypertension   . Oxygen deficiency    2 L at night  . Substance abuse ([place])    has been clean for 4 months     Past Surgical History:  Procedure Laterality Date  . AV FISTULA PLACEMENT Left 03/05/2018   Procedure: ARTERIOVENOUS (AV) FISTULA CREATION  LEFT UPPER EXTREMITY;  Surgeon: [name], MD;  Location: [facility];  Service: Vascular;  Laterality: Left;  . HEMATOMA EVACUATION Left 03/06/2018   Procedure: EVACUATION HEMATOMA LEFT ARM;  Surgeon: Angelia Mould, MD;  Location: [facility];  Service: Vascular;  Laterality: Left;  . IR FLUORO GUIDE CV LINE RIGHT  02/18/2018  . IR FLUORO GUIDE CV LINE RIGHT  02/26/2018  . IR US GUIDE VASC ACCESS RIGHT  02/18/2018    SOCHx:  reports that she has been smoking. She has been smoking about 0.50 packs per day. She has never used smokeless tobacco. She reports that she drinks alcohol. She reports that she has current or past drug history. Drug:  Cocaine.  FAMHx: Family History  Problem Relation Age of Onset  . Autoimmune disease Neg Hx     ALLERGIES: No Known Allergies  ROS: Review of Systems: General: negative for chills, fever, night sweats or weight changes.  Cardiovascular: negative for chest pain, dyspnea on exertion, edema, orthopnea, palpitations, paroxysmal nocturnal dyspnea or shortness of breath HEENT: negative for any visual disturbances, blindness, glaucoma Dermatological: negative for rash Respiratory: negative for cough, hemoptysis, or wheezing Urologic: negative for hematuria or dysuria Abdominal: negative for nausea, vomiting, diarrhea, bright red blood per rectum, melena, or hematemesis Neurologic: negative for visual changes, syncope, or dizziness Musculoskeletal: negative for back pain, joint pain, or swelling Psych: cooperative and appropriate All other systems reviewed and are otherwise negative except as noted above.   HOME MEDICATIONS: Prior to Admission medications   Medication Sig Start Date End Date Taking? Authorizing Provider  acetaminophen (TYLENOL) 325 MG tablet Take 2 tablets (650 mg total) by mouth every 6 (six) hours as needed for mild pain (or Fever >/= 101). 04/24/18  Yes Ghimire, Henreitta Leber, MD  calcitRIOL (ROCALTROL) 0.25 MCG capsule Take 1 capsule (0.25 mcg total) by mouth every other day. 04/14/18  Yes Nita Sells, MD  insulin aspart (NOVOLOG) 100 UNIT/ML injection Inject 0-9 Units into the skin 3 (three) times daily with meals. Patient taking differently: Inject 5  Units into the skin 3 (three) times daily with meals.  04/13/18  Yes Nita Sells, MD  insulin glargine (LANTUS) 100 UNIT/ML injection Inject 0.05 mLs (5 Units total) into the skin at bedtime. 04/13/18  Yes Nita Sells, MD  multivitamin (RENA-VIT) TABS tablet Take 1 tablet by mouth at bedtime. 04/13/18  Yes Nita Sells, MD  ondansetron (ZOFRAN) 4 MG tablet Take 1 tablet (4 mg total) by mouth every 6  (six) hours as needed for nausea. 04/13/18  Yes Nita Sells, MD  zolpidem (AMBIEN) 5 MG tablet Take 1 tablet (5 mg total) by mouth at bedtime as needed for sleep. 04/13/18  Yes Nita Sells, MD  amoxicillin-clavulanate (AUGMENTIN) 500-125 MG tablet Take 1 tablet (500 mg total) by mouth every evening for 3 days. 04/24/18 04/27/18  Jonetta Osgood, MD  ceFAZolin 2 g in dextrose 5 % 100 mL ivpb Inject 2 g into the vein every dialysis for up to 3 doses. 04/24/18   Ghimire, Henreitta Leber, MD  feeding supplement, GLUCERNA SHAKE, (GLUCERNA SHAKE) LIQD Take 237 mLs by mouth 2 (two) times daily between meals. Patient not taking: Reported on 04/26/2018 04/24/18   Jonetta Osgood, MD  metoprolol tartrate (LOPRESSOR) 25 MG tablet Take 0.5 tablets (12.5 mg total) by mouth 2 (two) times daily. 04/24/18 04/24/19  Ghimire, Henreitta Leber, MD    HOSPITAL MEDICATIONS: I have reviewed the patient's current medications.  VITALS: Blood pressure 121/85, pulse (!) 119, temperature 98.4 F (36.9 C), temperature source Oral, resp. rate (!) 24, weight 59.5 kg, SpO2 98 %.  PHYSICAL EXAM: General appearance: cooperative, no distress and sleepy Neck: no JVD  Dialysis catheter Rt chest Lungs: decreased breath sounds, splinting Heart: regular rate and rhythm Abdomen: not distended, tender epigastric area to palpation Extremities: no edema, AVF LUE Pulses: diminnished Skin: warm and dry Neurologic: Grossly normal  LABS: Results for orders placed or performed during the hospital encounter of 04/26/18 (from the past 24 hour(s))  Basic metabolic panel     Status: Abnormal   Collection Time: 04/26/18  5:26 PM  Result Value Ref Range   Sodium 138 135 - 145 mmol/L   Potassium 3.9 3.5 - 5.1 mmol/L   Chloride 97 (L) 98 - 111 mmol/L   CO2 25 22 - 32 mmol/L   Glucose, Bld 153 (H) 70 - 99 mg/dL   BUN 8 8 - 23 mg/dL   Creatinine, Ser 3.14 (H) 0.44 - 1.00 mg/dL   Calcium 7.8 (L) 8.9 - 10.3 mg/dL   GFR calc non Af  Amer 15 (L) >60 mL/min   GFR calc Af Amer 17 (L) >60 mL/min   Anion gap 16 (H) 5 - 15  CBC     Status: Abnormal   Collection Time: 04/26/18  5:26 PM  Result Value Ref Range   WBC 7.9 4.0 - 10.5 K/uL   RBC 4.64 3.87 - 5.11 MIL/uL   Hemoglobin 11.5 (L) 12.0 - 15.0 g/dL   HCT 38.1 36.0 - 46.0 %   MCV 82.1 78.0 - 100.0 fL   MCH 24.8 (L) 26.0 - 34.0 pg   MCHC 30.2 30.0 - 36.0 g/dL   RDW 19.6 (H) 11.5 - 15.5 %   Platelets 264 150 - 400 K/uL  Troponin I (q 6hr x 3)     Status: Abnormal   Collection Time: 04/26/18  5:26 PM  Result Value Ref Range   Troponin I 0.04 (HH) <0.03 ng/mL  Creatinine, serum     Status: Abnormal  Collection Time: 04/26/18  5:26 PM  Result Value Ref Range   Creatinine, Ser 3.19 (H) 0.44 - 1.00 mg/dL   GFR calc non Af Amer 14 (L) >60 mL/min   GFR calc Af Amer 17 (L) >60 mL/min  I-stat troponin, ED     Status: None   Collection Time: 04/26/18  5:39 PM  Result Value Ref Range   Troponin i, poc 0.06 0.00 - 0.08 ng/mL   Comment 3          I-Stat CG4 Lactic Acid, ED     Status: Abnormal   Collection Time: 04/26/18  5:42 PM  Result Value Ref Range   Lactic Acid, Venous 4.83 (HH) 0.5 - 1.9 mmol/L   Comment NOTIFIED PHYSICIAN   I-Stat CG4 Lactic Acid, ED     Status: Abnormal   Collection Time: 04/26/18  7:41 PM  Result Value Ref Range   Lactic Acid, Venous 1.92 (H) 0.5 - 1.9 mmol/L  D-dimer, quantitative (not at [facility])     Status: Abnormal   Collection Time: 04/26/18  8:18 PM  Result Value Ref Range   D-Dimer, Quant 10.38 (H) 0.00 - 0.50 ug/mL-FEU  MRSA PCR Screening     Status: None   Collection Time: 04/26/18  8:37 PM  Result Value Ref Range   MRSA by PCR NEGATIVE NEGATIVE  Glucose, capillary     Status: Abnormal   Collection Time: 04/26/18  9:34 PM  Result Value Ref Range   Glucose-Capillary 117 (H) 70 - 99 mg/dL  Troponin I (q 6hr x 3)     Status: Abnormal   Collection Time: 04/27/18 12:49 AM  Result Value Ref Range   Troponin I 0.07 (HH) <0.03 ng/mL     EKG: NSR, ST- 135  IMAGING: Dg Chest 2 View  Result Date: 04/26/2018 CLINICAL DATA:  Chest pain altered mental status and sepsis. EXAM: CHEST - 2 VIEW COMPARISON:  04/17/2018 and prior radiographs FINDINGS: New RIGHT LOWER lung airspace disease/consolidation likely represents pneumonia. Mild cardiomegaly and RIGHT central venous catheter again noted. No definite pleural effusion or pneumothorax. The LEFT lung is clear. IMPRESSION: RIGHT LOWER lung airspace disease/consolidation likely representing pneumonia. Electronically Signed   By: Margarette Canada M.D.   On: 04/26/2018 18:34    IMPRESSION: Chest pain- Does not sound like angina- R/O PE  ESRD- HD Tues, Thurs, Sat  COPD- Recent admission for BOOP  RLL pneumonia By CXR-ABs started  IDDM  H/O substance abuse   RECOMMENDATION: STAT chest CTA- MD to see  Time Spent Directly with Patient: [id number] minutes  Kerin Ransom, Hasty beeper 04/27/2018, 8:03 AM   The patient was seen, examined and discussed with Kerin Ransom, PA-C and I agree with the above.   62 y/o female with h/o HTN, DM, chronic respiratory failure on O2 HS, ESRD-on HD for 3 months, and substance abuse. Dr Gwenlyn Found saw her in consult 04/03/18 for PSVT in the setting of acute respiratory failure and + UDS for cocaine. Echo then showed an EF of 50-55%, severe LAE, mild to moderate MR. She didn't show up for a follow up, presented on 04/26/18 with complaints of atypical right sided chest pain present while she is coughing. Worse with inspiration and with movements, not exertional.   Physical exam: RRR, Lungs: NAD, crackles at the right base and up to mid lungs, no LE edema.   Labs:Lactic acid: 4.83, Crea 4.3, Troponin 0-07-->0.04, Hb 8.8. D-dimer 10.3. CXR - RLL pneumonia,  ECG shows sinus tachycardia,  LVH, no acute changes, unchanged from prior.  Assessment and plan:  The patient resent with a very atypical chest pain - pleuritic in the settings of an acute  pneumonia, sepsis and possible pulmonary embolism. She is also ESRD on HD, troponin elevation was very low with a flat trend, most probably representing demand ischemia. No ischemic evaluation is needed at this point. I would increase metoprolol to 25 mg po BID and allow good hydration.   Ena Dawley, MD 04/27/2018

## 2018-04-27 NOTE — Progress Notes (Signed)
Patient admitted to room 5W 21 in stable condition via stretcher, accompanied by ED RN.  Patient placed on cardiac monitor and verified with second verifier and CCMD. CHG bath given, MRSA nasal swab completed.  Skin check with second RN completed.  Patient oriented to room, call bell and telephone placed within patient's reach.  RN will continue to monitor patient. P.J. Linus Mako, RN

## 2018-04-27 NOTE — Progress Notes (Signed)
Dialysis: TTS  4h 60kg 2/2.25 bath TDC/ maturing LUA BC AVF (03/06/18) Hep 2000 - venofer 100 every HD x 10 - mircera 225 ug every 2 wks  - vit d 0.25 ug mwf  Impression: 1ESRD - cont on HD TTS. 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  2 Candida parapsilosis bacteremia, has TDC placed 6/19 by IR-needs removal.  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   8 Thrombocytopenia -chronic 9 Hep C+  Plan:  IR has been requested to remove TDC, we will ask VVS about early use of AVF.  Subjective: Interval History: Candida in blood  Objective: Vital signs in last 24 hours: Temp:  [97.9 F (36.6 C)-99.2 F (37.3 C)] 98.4 F (36.9 C) (08/18 0800) Pulse Rate:  [103-135] 103 (08/18 0800) Resp:  [21-33] 26 (08/18 0800) BP: (105-130)/(69-89) 130/85 (08/18 0800) SpO2:  [96 %-100 %] 96 % (08/18 0800) Weight:  [59.5 kg] 59.5 kg (08/18 0407) Weight change:   Intake/Output from previous day: 08/17 0701 - 08/18 0700 In: 677.1 [P.O.:120; I.V.:7.1; IV Piggyback:550] Out: -  Intake/Output this shift: No intake/output data recorded.  General appearance: alert and cooperative Neck: no adenopathy, no carotid bruit, no JVD, supple, symmetrical, trachea midline, thyroid not enlarged, symmetric, no tenderness/mass/nodules and right TDC Extremities: LUE AVF with needle mark, doubt usable  Lab Results: Recent Labs    04/26/18 1726 04/27/18 0706  WBC 7.9 4.7  HGB 11.5* 8.8*  HCT 38.1 28.6*  PLT 264 237   BMET:  Recent Labs    04/26/18 1726 04/27/18 0706  NA 138 138  K 3.9 3.0*  CL 97* 97*  CO2 25 28  GLUCOSE 153* 119*  BUN 8 17  CREATININE 3.19*  3.14* 4.30*  CALCIUM 7.8* 7.5*   No results for input(s): PTH in the last 72 hours. Iron Studies: No results for input(s): IRON, TIBC, TRANSFERRIN, FERRITIN in the last 72 hours. Studies/Results: Dg Chest 2 View  Result Date:  04/26/2018 CLINICAL DATA:  Chest pain altered mental status and sepsis. EXAM: CHEST - 2 VIEW COMPARISON:  04/17/2018 and prior radiographs FINDINGS: New RIGHT LOWER lung airspace disease/consolidation likely represents pneumonia. Mild cardiomegaly and RIGHT central venous catheter again noted. No definite pleural effusion or pneumothorax. The LEFT lung is clear. IMPRESSION: RIGHT LOWER lung airspace disease/consolidation likely representing pneumonia. Electronically Signed   By: Margarette Canada M.D.   On: 04/26/2018 18:34    Scheduled: . [START ON 04/28/2018] calcitRIOL  0.25 mcg Oral QODAY  . heparin  5,000 Units Subcutaneous Q8H  . insulin aspart  5 Units Subcutaneous TID WC  . insulin glargine  5 Units Subcutaneous QHS  . iopamidol      . metoprolol tartrate  12.5 mg Oral BID  . multivitamin  1 tablet Oral QHS  . potassium chloride  40 mEq Oral BID  . sodium chloride flush  3 mL Intravenous Q12H     LOS: 1 day   Estanislado Emms 04/27/2018,11:02 AM

## 2018-04-28 ENCOUNTER — Other Ambulatory Visit: Payer: Self-pay

## 2018-04-28 ENCOUNTER — Encounter (HOSPITAL_COMMUNITY): Payer: Self-pay | Admitting: General Practice

## 2018-04-28 DIAGNOSIS — I2699 Other pulmonary embolism without acute cor pulmonale: Secondary | ICD-10-CM

## 2018-04-28 DIAGNOSIS — I2609 Other pulmonary embolism with acute cor pulmonale: Secondary | ICD-10-CM

## 2018-04-28 DIAGNOSIS — T827XXA Infection and inflammatory reaction due to other cardiac and vascular devices, implants and grafts, initial encounter: Secondary | ICD-10-CM

## 2018-04-28 DIAGNOSIS — D649 Anemia, unspecified: Secondary | ICD-10-CM

## 2018-04-28 LAB — CULTURE, BLOOD (ROUTINE X 2)
CULTURE: NO GROWTH
Special Requests: ADEQUATE

## 2018-04-28 LAB — LACTIC ACID, PLASMA: LACTIC ACID, VENOUS: 1.1 mmol/L (ref 0.5–1.9)

## 2018-04-28 LAB — BASIC METABOLIC PANEL
Anion gap: 10 (ref 5–15)
BUN: 22 mg/dL (ref 8–23)
CHLORIDE: 100 mmol/L (ref 98–111)
CO2: 26 mmol/L (ref 22–32)
CREATININE: 5.22 mg/dL — AB (ref 0.44–1.00)
Calcium: 7.9 mg/dL — ABNORMAL LOW (ref 8.9–10.3)
GFR calc Af Amer: 9 mL/min — ABNORMAL LOW (ref >60)
GFR calc non Af Amer: 8 mL/min — ABNORMAL LOW (ref >60)
GLUCOSE: 113 mg/dL — AB (ref 70–99)
POTASSIUM: 3.7 mmol/L (ref 3.5–5.1)
SODIUM: 136 mmol/L (ref 135–145)

## 2018-04-28 LAB — CBC
HEMATOCRIT: 33.6 % — AB (ref 36.0–46.0)
HEMATOCRIT: 27.8 % — AB (ref 36.0–46.0)
HEMOGLOBIN: 10.2 g/dL — AB (ref 12.0–15.0)
HEMOGLOBIN: 8.7 g/dL — AB (ref 12.0–15.0)
MCH: 24.5 pg — ABNORMAL LOW (ref 26.0–34.0)
MCH: 24.7 pg — ABNORMAL LOW (ref 26.0–34.0)
MCHC: 30.4 g/dL (ref 30.0–36.0)
MCHC: 31.3 g/dL (ref 30.0–36.0)
MCV: 79 fL (ref 78.0–100.0)
MCV: 80.8 fL (ref 78.0–100.0)
Platelets: 283 10*3/uL (ref 150–400)
Platelets: 393 10*3/uL (ref 150–400)
RBC: 3.52 MIL/uL — ABNORMAL LOW (ref 3.87–5.11)
RBC: 4.16 MIL/uL (ref 3.87–5.11)
RDW: 19.4 % — ABNORMAL HIGH (ref 11.5–15.5)
RDW: 19.4 % — ABNORMAL HIGH (ref 11.5–15.5)
WBC: 4.7 10*3/uL (ref 4.0–10.5)
WBC: 4.8 10*3/uL (ref 4.0–10.5)

## 2018-04-28 LAB — PROTIME-INR
INR: 1
Prothrombin Time: 13.1 seconds (ref 11.4–15.2)

## 2018-04-28 LAB — GLUCOSE, CAPILLARY
Glucose-Capillary: 120 mg/dL — ABNORMAL HIGH (ref 70–99)
Glucose-Capillary: 149 mg/dL — ABNORMAL HIGH (ref 70–99)
Glucose-Capillary: 155 mg/dL — ABNORMAL HIGH (ref 70–99)
Glucose-Capillary: 82 mg/dL (ref 70–99)

## 2018-04-28 LAB — HEPARIN LEVEL (UNFRACTIONATED)
HEPARIN UNFRACTIONATED: 0.19 [IU]/mL — AB (ref 0.30–0.70)
Heparin Unfractionated: 0.56 IU/mL (ref 0.30–0.70)
Heparin Unfractionated: 0.43 IU/mL (ref 0.30–0.70)

## 2018-04-28 MED ORDER — CHLORHEXIDINE GLUCONATE CLOTH 2 % EX PADS
6.0000 | MEDICATED_PAD | Freq: Every day | CUTANEOUS | Status: DC
  Administered 2018-04-28 – 2018-05-01 (×2): 6 via TOPICAL

## 2018-04-28 MED ORDER — NEPRO/CARBSTEADY PO LIQD
237.0000 mL | Freq: Two times a day (BID) | ORAL | Status: DC
  Administered 2018-04-28 (×2): 237 mL via ORAL

## 2018-04-28 MED ORDER — TRAMADOL HCL 50 MG PO TABS
50.0000 mg | ORAL_TABLET | Freq: Two times a day (BID) | ORAL | Status: DC | PRN
  Administered 2018-04-28 – 2018-05-01 (×5): 50 mg via ORAL
  Filled 2018-04-28 (×6): qty 1

## 2018-04-28 MED ORDER — WARFARIN - PHARMACIST DOSING INPATIENT
Freq: Every day | Status: DC
  Administered 2018-04-29 – 2018-05-01 (×2)

## 2018-04-28 MED ORDER — WARFARIN SODIUM 5 MG PO TABS
5.0000 mg | ORAL_TABLET | Freq: Once | ORAL | Status: AC
  Administered 2018-04-28: 5 mg via ORAL
  Filled 2018-04-28: qty 1

## 2018-04-28 MED ORDER — HEPARIN BOLUS VIA INFUSION
1500.0000 [IU] | Freq: Once | INTRAVENOUS | Status: AC
  Administered 2018-04-28: 1500 [IU] via INTRAVENOUS
  Filled 2018-04-28: qty 1500

## 2018-04-28 MED ORDER — PRO-STAT SUGAR FREE PO LIQD
30.0000 mL | Freq: Two times a day (BID) | ORAL | Status: DC
  Administered 2018-04-29 – 2018-04-30 (×2): 30 mL via ORAL
  Filled 2018-04-28 (×7): qty 30

## 2018-04-28 NOTE — Progress Notes (Addendum)
[facility] Progress Note   Dialysis Orders: Dialysis: TTS  [place] 4h 60kg 2/2.25 bath TDC- removed this admission LUA BC AVF  Hep 2000 - venofer 100 every HD x 10 - mircera 225 ug every 2 wks  - vit d 0.25 ug mwf Assessment/Plan: 1. Candida parapsilosis bacteremia, has TDC removed - to use AVF - had been d/c 8/15 and readmitted 8/17when results from prior admission returned + for candidata.  1 set of BC + microccocus 8/9, f/u surveillance BC 1/2 + candida 8/14- cultures 8/17 since then no growth - TDC removed 8/18 by IR - on Eraxis 2. ESRD -TTS - HD Tuesday. 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 - use AVF Tuesday 3. Anemia - hgb 8.2 8/15 > 11.5 8/15- will see what next hgb shows before ordering ESA 4. Secondary hyperparathyroidism - continue calcitriol/ not on binders yet 5. HTN/volume - titrate edw  6. Nutrition -alb 1.6 - add supplement/mutivit 7.  RLL PNA - meds per primary 8.  Chronic thrombocytopenia- not an issue now 9. Hep C + 10. Hx polysubstance abuse - neg urine drug screen 8/3 11. ILD 12. + VQ scan 8/18 -n heparin to transition to coumadin - will need someone to manage this after d/c  Myriam Jacobson, PA-C [place] [phone] 04/28/2018,11:27 AM  LOS: 2 days   Pt seen, examined and agree w A/P as above.  Kelly Splinter MD [employer] pager [phone]   04/28/2018, 1:37 PM    Subjective:   Had a hard time remembering where her dialysis unit was, but eventually figured out the street it was on.  Objective Vitals:   04/28/18 0112 04/28/18 0456 04/28/18 0849 04/28/18 0900  BP: (!) 143/97 138/85 (!) 129/94   Pulse:      Resp: 16 (!) 27    Temp: 99 F (37.2 C) 99 F (37.2 C)  98.6 F (37 C)  TempSrc: Oral Oral  Oral  SpO2: 97%     Weight:  61 kg     Physical Exam General: NAD Heart: RRR Lungs: crackles at bases Abdomen: soft NT Extremities: no sig LE edema Dialysis  Access: left AVF + bruit   Additional Objective Labs: Basic Metabolic Panel: Recent Labs  Lab 04/22/18 0815 04/24/18 0621 04/26/18 1726 04/27/18 0706 04/27/18 2357  NA 135 138 138 138 136  K 4.1 3.9 3.9 3.0* 3.7  CL 98 103 97* 97* 100  CO2 26 25 25 28 26   GLUCOSE 133* 121* 153* 119* 113*  BUN 51* 34* 8 17 22   CREATININE 6.10* 5.88* 3.19*  3.14* 4.30* 5.22*  CALCIUM 7.5* 7.7* 7.8* 7.5* 7.9*  PHOS 6.3* 5.2*  --   --   --    Liver Function Tests: Recent Labs  Lab 04/22/18 0815 04/24/18 0621 04/27/18 0706  AST  --   --  28  ALT  --   --  14  ALKPHOS  --   --  113  BILITOT  --   --  0.6  PROT  --   --  5.3*  ALBUMIN 1.6* 1.5* 1.6*   No results for input(s): LIPASE, AMYLASE in the last 168 hours. CBC: Recent Labs  Lab 04/22/18 0810 04/24/18 0621 04/26/18 1726 04/27/18 0706 04/27/18 2357  WBC 6.1 4.1 7.9 4.7 4.7  HGB 8.0* 8.2* 11.5* 8.8* 8.7*  HCT 25.5* 26.5* 38.1 28.6* 27.8*  MCV 82.0 80.8 82.1 79.7 79.0  PLT 102* 150 264 237  283   Blood Culture    Component Value Date/Time   SDES BLOOD RIGHT ANTECUBITAL 04/26/2018 1830   SPECREQUEST  04/26/2018 1830    BOTTLES DRAWN AEROBIC AND ANAEROBIC Blood Culture adequate volume   CULT  04/26/2018 1830    NO GROWTH < 24 HOURS Performed at [facility], [place] [address], [place], [place] [address]    REPTSTATUS PENDING 04/26/2018 1830    Cardiac Enzymes: Recent Labs  Lab 04/26/18 1726 04/27/18 0049 04/27/18 0706  TROPONINI 0.04* 0.07* 0.04*   CBG: Recent Labs  Lab 04/27/18 0821 04/27/18 1435 04/27/18 1700 04/27/18 2126 04/28/18 0850  GLUCAP 99 142* 71 79 120*   Iron Studies: No results for input(s): IRON, TIBC, TRANSFERRIN, FERRITIN in the last 72 hours. Lab Results  Component Value Date   INR 1.20 02/25/2018   INR 1.07 02/12/2018   Studies/Results: Dg Chest 2 View  Result Date: 04/26/2018 CLINICAL DATA:  Chest pain altered mental status and sepsis. EXAM: CHEST - 2 VIEW COMPARISON:   04/17/2018 and prior radiographs FINDINGS: New RIGHT LOWER lung airspace disease/consolidation likely represents pneumonia. Mild cardiomegaly and RIGHT central venous catheter again noted. No definite pleural effusion or pneumothorax. The LEFT lung is clear. IMPRESSION: RIGHT LOWER lung airspace disease/consolidation likely representing pneumonia. Electronically Signed   By: Margarette Canada M.D.   On: 04/26/2018 18:34   Nm Pulmonary Perf And Vent  Result Date: 04/27/2018 CLINICAL DATA:  Shortness of breath EXAM: NUCLEAR MEDICINE VENTILATION - PERFUSION LUNG SCAN VIEWS: Anterior, posterior, left lateral, right lateral, RAO, LAO, RPO, LPO-ventilation perfusion RADIOPHARMACEUTICALS:  32.3 mCi of Tc-51m DTPA aerosol inhalation and 4.2 mCi Tc95m-MAA IV COMPARISON:  Ventilation perfusion lung scans March 20, 2018; chest radiograph April 26, 2018 FINDINGS: Ventilation: The distribution of uptake on the ventilation study is homogeneous and symmetric without appreciable change from prior study. Perfusion: There is a focal perfusion defect in the superior segment of the left lower lobe, not present on prior lung scan examination. There is also a perfusion defect in the anterior segment of the left lower lobe, not present on prior lung scan examination. There are no corresponding ventilation defects or left lower lobe chest radiographic abnormality. No perfusion defects are noted elsewhere. IMPRESSION: There are 2 segmental level perfusion defects in the left lower lobe which were not present on prior lung scan and are not appreciable on the ventilation study. This study therefore constitutes a high probability of pulmonary embolus based on PIOPED II criteria. Critical Value/emergent results were called by telephone at the time of interpretation on 04/27/2018 at 2:37 pm to Dr. Shelly Coss , who verbally acknowledged these results. Electronically Signed   By: Lowella Grip III M.D.   On: 04/27/2018 14:38   Ir Removal  Tun Cv Cath W/o Fl  Result Date: 04/27/2018 INDICATION: 62 year old with end-stage renal disease and tunneled dialysis catheter. Patient has a Candida infection and needs a removal of the dialysis catheter. EXAM: REMOVAL TUNNELED DIALYSIS CATHETER MEDICATIONS: None ANESTHESIA/SEDATION: None FLUOROSCOPY TIME:  None COMPLICATIONS: None immediate. PROCEDURE: Informed written consent was obtained from the patient after a thorough discussion of the procedural risks, benefits and alternatives. All questions were addressed. Maximal Sterile Barrier Technique was utilized including caps, mask, sterile gowns, sterile gloves, sterile drape, hand hygiene and skin antiseptic. A timeout was performed prior to the initiation of the procedure. [name] right chest and catheter was prepped and draped in a normal sterile fashion. Heparin was removed from both ports of catheter.  1% lidocaine was used for local anesthesia. Using gentle blunt dissection the cuff of the catheter was exposed and the catheter was removed in it's entirety. Pressure was held till hemostasis was obtained. A sterile dressing was applied. In addition, the patient had 2 sutures in the right lower neck which were both removed. The patient tolerated the procedure well with no immediate complications. IMPRESSION: Successful catheter removal as described above. Electronically Signed   By: Markus Daft M.D.   On: 04/27/2018 13:02   Medications: . sodium chloride Stopped (04/27/18 1801)  . anidulafungin 100 mg (04/27/18 2121)  . ceFEPime (MAXIPIME) IV 1 g (04/27/18 2117)  . heparin 1,100 Units/hr (04/28/18 0117)  . [START ON 04/29/2018] vancomycin     . calcitRIOL  0.25 mcg Oral QODAY  . insulin aspart  5 Units Subcutaneous TID WC  . insulin glargine  5 Units Subcutaneous QHS  . metoprolol tartrate  25 mg Oral BID  . multivitamin  1 tablet Oral QHS  . sodium chloride flush  3 mL Intravenous Q12H

## 2018-04-28 NOTE — Progress Notes (Signed)
ANTICOAGULATION CONSULT NOTE   Pharmacy Consult for Heparin and Coumadin Indication: pulmonary embolus  No Known Allergies  Patient Measurements: Weight: 134 lb 7.7 oz (61 kg)  IBW 47.8 kg Heparin Dosing Weight: 59.5 kg  Vital Signs: Temp: 98.2 F (36.8 C) (08/19 1424) Temp Source: Oral (08/19 1424) BP: 103/66 (08/19 1424) Pulse Rate: 93 (08/19 1424)  Labs: Recent Labs    04/26/18 1726 04/27/18 0049 04/27/18 0706 04/27/18 2357 04/28/18 0840 04/28/18 1421  HGB 11.5*  --  8.8* 8.7*  --  10.2*  HCT 38.1  --  28.6* 27.8*  --  33.6*  PLT 264  --  237 283  --  393  LABPROT  --   --   --   --   --  13.1  INR  --   --   --   --   --  1.00  HEPARINUNFRC  --   --   --  0.19* 0.56 0.43  CREATININE 3.19*  3.14*  --  4.30* 5.22*  --   --   TROPONINI 0.04* 0.07* 0.04*  --   --   --     Estimated Creatinine Clearance: 9.1 mL/min (A) (by C-G formula based on SCr of 5.22 mg/dL (H)).  Assessment: 89 YOF who was recently discharged 8/16 after being treated for HCAP - called back for admission on 8/17 for fungemia. Now with further work-up also revealing a new acute pulmonary emboli. Pharmacy dosing heparin for anticoagulation and now consulted to begin coumadin.  Noted recent AVF placement followed by a small hematoma evacuation on 03/05/18-03/06/18. 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  Heparin level remains therapeutic at 0.43 on 1100 units/hr. No bleeding documented. Baseline INR 1.  Goal of Therapy:  Heparin level 0.3-0.7 units/ml Monitor platelets by anticoagulation protocol: Yes   Plan:  Coumadin 5mg  po today Continue heparin drip at 1100 units/hr Daily heparin level, INR, and CBC Monitor for s/sx of bleeding   Sherlon Handing, PharmD, BCPS Please check AMION for all Pharmacist numbers by unit 04/28/2018 3:00 PM

## 2018-04-28 NOTE — Progress Notes (Signed)
ANTICOAGULATION CONSULT NOTE   Pharmacy Consult for Heparin Indication: pulmonary embolus  No Known Allergies  Patient Measurements: Weight: 134 lb 7.7 oz (61 kg)  IBW 47.8 kg Heparin Dosing Weight: 59.5 kg  Vital Signs: Temp: 98.6 F (37 C) (08/19 0900) Temp Source: Oral (08/19 0900) BP: 129/94 (08/19 0849)  Labs: Recent Labs    04/26/18 1726 04/27/18 0049 04/27/18 0706 04/27/18 2357 04/28/18 0840  HGB 11.5*  --  8.8* 8.7*  --   HCT 38.1  --  28.6* 27.8*  --   PLT 264  --  237 283  --   HEPARINUNFRC  --   --   --  0.19* 0.56  CREATININE 3.19*  3.14*  --  4.30* 5.22*  --   TROPONINI 0.04* 0.07* 0.04*  --   --     Estimated Creatinine Clearance: 9.1 mL/min (A) (by C-G formula based on SCr of 5.22 mg/dL (H)).  Assessment: 13 YOF who was recently discharged 8/16 after being treated for HCAP - called back for admission on 8/17 for fungemia. Now with further work-up also revealing a new acute pulmonary emboli. Pharmacy consulted to start heparin for anticoagulation.   Heparin level is therapeutic at 0.56 on 1100 units/hr. No bleeding documented bleeding; Noted recent AVF placement followed by a small hematoma evacuation on 03/05/18-03/06/18. 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  Goal of Therapy:  Heparin level 0.3-0.7 units/ml Monitor platelets by anticoagulation protocol: Yes   Plan:  Continue heparin drip at 1100 units/hr Confirmatory heparin level, INR, CBC this afternoon Monitor for s/sx of bleeding F/U transition to oral agent, warfarin recommended with ESRD   Renold Genta, PharmD, BCPS Clinical Pharmacist Clinical phone for 04/28/2018 until 3p is [phone] Please check AMION for all Pharmacist numbers by unit 04/28/2018 10:59 AM

## 2018-04-28 NOTE — Anesthesia Preprocedure Evaluation (Addendum)
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 on medications and Pt. on home beta blockers Normal cardiovascular exam  TTE 03/2018 EF 50-55%. Diffuse hypokinesis. Mild to moderate MR. Moderate TR. LA severely dilated. RA severely dilated.    Neuro/Psych negative neurological ROS  negative psych ROS   GI/Hepatic negative GI ROS, (+)     substance abuse (no illicit drug use for 4 months)  alcohol use and cocaine use,   Endo/Other  diabetes, Type 2, Insulin Dependent  Renal/GU Dialysis and ESRFRenal disease     Musculoskeletal negative musculoskeletal ROS (+)   Abdominal   Peds  Hematology negative hematology ROS (+)   Anesthesia Other Findings Admitted for PE and Candida fungemia. TEE to rule out endocarditis.   Day of surgery medications reviewed with the patient.  Reproductive/Obstetrics                         Anesthesia Physical Anesthesia Plan  ASA: III  Anesthesia Plan: MAC   Post-op Pain Management:    Induction:   PONV Risk Score and Plan: 2 and Ondansetron and Propofol infusion  Airway Management Planned: Natural Airway  Additional Equipment:   Intra-op Plan:   Post-operative Plan:   Informed Consent: I have reviewed the patients History and Physical, chart, labs and discussed the procedure including the risks, benefits and alternatives for the proposed anesthesia  with the patient or authorized representative who has indicated his/her understanding and acceptance.   Dental advisory given  Plan Discussed with: CRNA and Anesthesiologist  Anesthesia Plan Comments:         Anesthesia Quick Evaluation

## 2018-04-28 NOTE — Progress Notes (Signed)
ANTICOAGULATION CONSULT NOTE  Pharmacy Consult for Heparin Indication: pulmonary embolus  No Known Allergies  Patient Measurements: Weight: 131 lb 2.8 oz (59.5 kg)  IBW 47.8 kg Heparin Dosing Weight: 59.5 kg  Vital Signs: Temp: 98.9 F (37.2 C) (08/18 2012) Temp Source: Oral (08/18 2012) BP: 133/77 (08/18 1503) Pulse Rate: 101 (08/18 1503)  Labs: Recent Labs    04/26/18 1726 04/27/18 0049 04/27/18 0706 04/27/18 2357  HGB 11.5*  --  8.8* 8.7*  HCT 38.1  --  28.6* 27.8*  PLT 264  --  237 283  HEPARINUNFRC  --   --   --  0.19*  CREATININE 3.19*  3.14*  --  4.30*  --   TROPONINI 0.04* 0.07* 0.04*  --     Estimated Creatinine Clearance: 11 mL/min (A) (by C-G formula based on SCr of 4.3 mg/dL (H)).  Assessment: 62 y.o. female with LLL PE for heparin  Goal of Therapy:  Heparin level 0.3-0.7 units/ml Monitor platelets by anticoagulation protocol: Yes   Plan:  Heparin 1500 units IV bolus, then increase heparin 1100 units/hr Check heparin level in 8 hours.  Phillis Knack, PharmD, BCPS

## 2018-04-28 NOTE — Progress Notes (Signed)
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  She was recently discharged from here after being treated for pneumonia.  Patient was also complaining of some shortness of breath and chest pain at home.  Patient admitted for further management.  Patient also found to have pulmonary embolism as per the VQ scan.  Currently she is  Being managed  For PE, Candida fungemia, healthcare associated pneumonia.  Cardiology, nephrology and ID are following.  Assessment & Plan:   Principal Problem:   Sepsis ([place]) Active Problems:   ESRD (end stage renal disease) ([place])   Anemia   DM (diabetes mellitus), type 2 with renal complications ([facility])   Dyspnea   Chest pain   Tachycardia   Fungemia   Pulmonary embolism ([place])   Fungemia: Blood cultures that were drawn on her last visit showed Candida parapsilosis.  Started on Eraxis.  ID following.  Hemodialysis  catheter removed.  Undergoing TEE tomorrow.  PE/Elevated d-dimer:  D-dimer found to be elevated.   VQ scan which showed hyper probability of PE.  Started on heparin drip.  She will be started on bridging with Coumadin .  She will follow-up with Coumadin clinic or her primary care physician to monitor her INR.  Healthcare associated pneumonia: Was discharged on IV cefazolin and Augmentin.  Currently she is afebrile and hemodynamically stable.  Started on Vanco and cefepime on this admission.  Chest x-ray done here showed RIGHT LOWER lung airspace disease/consolidation likely representing pneumonia.  ID following  ESRD on dialysis: Nephrology following.  Permanent dialysis catheter removed.  She has AV fistula placed in left upper  extremity on 02/2618.  Nephrology planning to use that AV fistula tomorrow.  Chest pain: Currently resolved.  Most likely atypical chest pain.  Cardiology was following.  Troponin mild elevated and has not trended up.  Dose of metoprolol increased.   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  Currently H&H is stable.  Thrombocytopenia: Chronic  Diabetes type 2: Continue current regimen.  We will continue to monitor CBC.  History of BOOP: Completed a course of steroids.  Hypokalemia: Supplemented with potassium.  We will continue to monitor the levels.  Lactic acidosis: Resolved.   DVT prophylaxis:Heparin IV Code Status: Full Family Communication: None present at the bedside Disposition Plan: Pending further work-up   Consultants: Nephrology, cardiology, IR, ID  Procedures: Removal of HD catheter  Antimicrobials: Eraxis, vancomycin and cefepime  Subjective: Patient seen and examined the bedside this morning.  Currently her shortness of breath and chest pain have resolved.  She feels much better this morning.  Hemodynamically stable.  Afebrile. Objective: Vitals:   04/28/18 0744 04/28/18 0849 04/28/18 0900 04/28/18 1144  BP: (!) 129/94 (!) 129/94  101/66  Pulse:      Resp: (!) 23   (!) 25  Temp:   98.6 F (37 C) 98.8 F (37.1 C)  TempSrc:   Oral   SpO2:      Weight:        Intake/Output Summary (Last 24 hours) at 04/28/2018 1336 Last data filed at 04/28/2018 0501 Gross per 24 hour  Intake 372.09 ml  Output -  Net 372.09  ml   Filed Weights   04/27/18 0407 04/28/18 0456  Weight: 59.5 kg 61 kg    Examination:  General exam: Not in distress, generalized weakness HEENT:PERRL,Oral mucosa moist, Ear/Nose normal on gross exam Respiratory system: Decreased air entry in the bases  cardiovascular system: S1 & S2 heard, RRR. No JVD, murmurs, rubs, gallops or clicks. Trace pedal edema. Gastrointestinal system: Abdomen is  nondistended, soft and nontender. No organomegaly or masses felt. Normal bowel sounds heard. Central nervous system: Alert and oriented. No focal neurological deficits. Extremities: trace edema, no clubbing ,no cyanosis, distal peripheral pulses palpable.  Fistula on her left arm Skin: No rashes, lesions or ulcers,no icterus ,no pallor MSK: Normal muscle bulk,tone ,power Psychiatry: Judgement and insight appear normal. Mood & affect appropriate.     Data Reviewed: I have personally reviewed following labs and imaging studies  CBC: Recent Labs  Lab 04/22/18 0810 04/24/18 0621 04/26/18 1726 04/27/18 0706 04/27/18 2357  WBC 6.1 4.1 7.9 4.7 4.7  HGB 8.0* 8.2* 11.5* 8.8* 8.7*  HCT 25.5* 26.5* 38.1 28.6* 27.8*  MCV 82.0 80.8 82.1 79.7 79.0  PLT 102* 150 264 237 235   Basic Metabolic Panel: Recent Labs  Lab 04/22/18 0815 04/24/18 0621 04/26/18 1726 04/27/18 0706 04/27/18 2357  NA 135 138 138 138 136  K 4.1 3.9 3.9 3.0* 3.7  CL 98 103 97* 97* 100  CO2 26 25 25 28 26   GLUCOSE 133* 121* 153* 119* 113*  BUN 51* 34* 8 17 22   CREATININE 6.10* 5.88* 3.19*  3.14* 4.30* 5.22*  CALCIUM 7.5* 7.7* 7.8* 7.5* 7.9*  MG  --   --   --  1.7  --   PHOS 6.3* 5.2*  --   --   --    GFR: Estimated Creatinine Clearance: 9.1 mL/min (A) (by C-G formula based on SCr of 5.22 mg/dL (H)). Liver Function Tests: Recent Labs  Lab 04/22/18 0815 04/24/18 0621 04/27/18 0706  AST  --   --  28  ALT  --   --  14  ALKPHOS  --   --  113  BILITOT  --   --  0.6  PROT  --   --  5.3*  ALBUMIN 1.6* 1.5* 1.6*   No results for input(s): LIPASE, AMYLASE in the last 168 hours. No results for input(s): AMMONIA in the last 168 hours. Coagulation Profile: No results for input(s): INR, PROTIME in the last 168 hours. Cardiac Enzymes: Recent Labs  Lab 04/26/18 1726 04/27/18 0049 04/27/18 0706  TROPONINI 0.04* 0.07* 0.04*   BNP (last 3 results) No results for input(s): PROBNP in the last 8760  hours. HbA1C: No results for input(s): HGBA1C in the last 72 hours. CBG: Recent Labs  Lab 04/27/18 1435 04/27/18 1700 04/27/18 2126 04/28/18 0850 04/28/18 1152  GLUCAP 142* 71 79 120* 149*   Lipid Profile: No results for input(s): CHOL, HDL, LDLCALC, TRIG, CHOLHDL, LDLDIRECT in the last 72 hours. Thyroid Function Tests: No results for input(s): TSH, T4TOTAL, FREET4, T3FREE, THYROIDAB in the last 72 hours. Anemia Panel: No results for input(s): VITAMINB12, FOLATE, FERRITIN, TIBC, IRON, RETICCTPCT in the last 72 hours. Sepsis Labs: Recent Labs  Lab 04/26/18 1742 04/26/18 1941 04/27/18 2357  LATICACIDVEN 4.83* 1.92* 1.1    Recent Results (from the past 240 hour(s))  Culture, blood (routine x 2)     Status: Abnormal   Collection Time: 04/23/18  5:00 PM  Result Value Ref Range Status   Specimen Description BLOOD RIGHT  ANTECUBITAL  Final   Special Requests   Final    BOTTLES DRAWN AEROBIC AND ANAEROBIC Blood Culture adequate volume   Culture  Setup Time   Final    YEAST AEROBIC BOTTLE ONLY CRITICAL RESULT CALLED TO, READ BACK BY AND VERIFIED WITH: DR Sloan Leiter [phone] MLM Performed at [facility], [place] [address] [place], [place] [address]    Culture CANDIDA PARAPSILOSIS (A)  Final   Report Status 04/27/2018 FINAL  Final  Blood Culture ID Panel (Reflexed)     Status: Abnormal   Collection Time: 04/23/18  5:00 PM  Result Value Ref Range Status   Enterococcus species NOT DETECTED NOT DETECTED Final   Listeria monocytogenes NOT DETECTED NOT DETECTED Final   Staphylococcus species NOT DETECTED NOT DETECTED Final   Staphylococcus aureus NOT DETECTED NOT DETECTED Final   Streptococcus species NOT DETECTED NOT DETECTED Final   Streptococcus agalactiae NOT DETECTED NOT DETECTED Final   Streptococcus pneumoniae NOT DETECTED NOT DETECTED Final   Streptococcus pyogenes NOT DETECTED NOT DETECTED Final   Acinetobacter baumannii NOT DETECTED NOT DETECTED Final    Enterobacteriaceae species NOT DETECTED NOT DETECTED Final   Enterobacter cloacae complex NOT DETECTED NOT DETECTED Final   Escherichia coli NOT DETECTED NOT DETECTED Final   Klebsiella oxytoca NOT DETECTED NOT DETECTED Final   Klebsiella pneumoniae NOT DETECTED NOT DETECTED Final   Proteus species NOT DETECTED NOT DETECTED Final   Serratia marcescens NOT DETECTED NOT DETECTED Final   Haemophilus influenzae NOT DETECTED NOT DETECTED Final   Neisseria meningitidis NOT DETECTED NOT DETECTED Final   Pseudomonas aeruginosa NOT DETECTED NOT DETECTED Final   Candida albicans NOT DETECTED NOT DETECTED Final   Candida glabrata NOT DETECTED NOT DETECTED Final   Candida krusei NOT DETECTED NOT DETECTED Final   Candida parapsilosis DETECTED (A) NOT DETECTED Final    Comment: CRITICAL RESULT CALLED TO, READ BACK BY AND VERIFIED WITH: DR Sloan Leiter [phone] MLM    Candida tropicalis NOT DETECTED NOT DETECTED Final    Comment: Performed at [facility], [address] [place], [place] [address]  Culture, blood (routine x 2)     Status: None   Collection Time: 04/23/18  5:15 PM  Result Value Ref Range Status   Specimen Description BLOOD RIGHT FOREARM  Final   Special Requests   Final    BOTTLES DRAWN AEROBIC AND ANAEROBIC Blood Culture adequate volume   Culture   Final    NO GROWTH 5 DAYS Performed at [facility], [place] [address] [place], [place] [address]    Report Status 04/28/2018 FINAL  Final  Blood culture (routine x 2)     Status: None (Preliminary result)   Collection Time: 04/26/18  4:58 PM  Result Value Ref Range Status   Specimen Description BLOOD RIGHT HAND  Final   Special Requests   Final    BOTTLES DRAWN AEROBIC ONLY Blood Culture results may not be optimal due to an inadequate volume of blood received in culture bottles   Culture   Final    NO GROWTH 2 DAYS Performed at [facility], [place] [address], [place], [place] [address]    Report Status PENDING   Incomplete  Blood culture (routine x 2)     Status: None (Preliminary result)   Collection Time: 04/26/18  6:30 PM  Result Value Ref Range Status   Specimen Description BLOOD RIGHT ANTECUBITAL  Final   Special Requests  Final    BOTTLES DRAWN AEROBIC AND ANAEROBIC Blood Culture adequate volume   Culture   Final    NO GROWTH 2 DAYS Performed at [facility], [place] [address], [place], [place] [address]    Report Status PENDING  Incomplete  MRSA PCR Screening     Status: None   Collection Time: 04/26/18  8:37 PM  Result Value Ref Range Status   MRSA by PCR NEGATIVE NEGATIVE Final    Comment:        The GeneXpert MRSA Assay (FDA approved for NASAL specimens only), is one component of a comprehensive MRSA colonization surveillance program. It is not intended to diagnose MRSA infection nor to guide or monitor treatment for MRSA infections. Performed at [facility], [place] [address], [place], [place] [address]          Radiology Studies: Dg Chest 2 View  Result Date: 04/26/2018 CLINICAL DATA:  Chest pain altered mental status and sepsis. EXAM: CHEST - 2 VIEW COMPARISON:  04/17/2018 and prior radiographs FINDINGS: New RIGHT LOWER lung airspace disease/consolidation likely represents pneumonia. Mild cardiomegaly and RIGHT central venous catheter again noted. No definite pleural effusion or pneumothorax. The LEFT lung is clear. IMPRESSION: RIGHT LOWER lung airspace disease/consolidation likely representing pneumonia. Electronically Signed   By: Margarette Canada M.D.   On: 04/26/2018 18:34   Nm Pulmonary Perf And Vent  Result Date: 04/27/2018 CLINICAL DATA:  Shortness of breath EXAM: NUCLEAR MEDICINE VENTILATION - PERFUSION LUNG SCAN VIEWS: Anterior, posterior, left lateral, right lateral, RAO, LAO, RPO, LPO-ventilation perfusion RADIOPHARMACEUTICALS:  32.3 mCi of Tc-60m DTPA aerosol inhalation and 4.2 mCi Tc70m-MAA IV COMPARISON:  Ventilation perfusion lung scans March 20, 2018;  chest radiograph April 26, 2018 FINDINGS: Ventilation: The distribution of uptake on the ventilation study is homogeneous and symmetric without appreciable change from prior study. Perfusion: There is a focal perfusion defect in the superior segment of the left lower lobe, not present on prior lung scan examination. There is also a perfusion defect in the anterior segment of the left lower lobe, not present on prior lung scan examination. There are no corresponding ventilation defects or left lower lobe chest radiographic abnormality. No perfusion defects are noted elsewhere. IMPRESSION: There are 2 segmental level perfusion defects in the left lower lobe which were not present on prior lung scan and are not appreciable on the ventilation study. This study therefore constitutes a high probability of pulmonary embolus based on PIOPED II criteria. Critical Value/emergent results were called by telephone at the time of interpretation on 04/27/2018 at 2:37 pm to Dr. Shelly Coss , who verbally acknowledged these results. Electronically Signed   By: Lowella Grip III M.D.   On: 04/27/2018 14:38   Ir Removal Tun Cv Cath W/o Fl  Result Date: 04/27/2018 INDICATION: 62 year old with end-stage renal disease and tunneled dialysis catheter. Patient has a Candida infection and needs a removal of the dialysis catheter. EXAM: REMOVAL TUNNELED DIALYSIS CATHETER MEDICATIONS: None ANESTHESIA/SEDATION: None FLUOROSCOPY TIME:  None COMPLICATIONS: None immediate. PROCEDURE: Informed written consent was obtained from the patient after a thorough discussion of the procedural risks, benefits and alternatives. All questions were addressed. Maximal Sterile Barrier Technique was utilized including caps, mask, sterile gowns, sterile gloves, sterile drape, hand hygiene and skin antiseptic. A timeout was performed prior to the initiation of the procedure. [name] right chest and catheter was prepped and draped in a normal sterile  fashion. Heparin was removed from both ports of catheter.  1% lidocaine was used for local anesthesia. Using gentle blunt dissection the cuff of the catheter was exposed and the catheter was removed in it's entirety. Pressure was held till hemostasis was obtained. A sterile dressing was applied. In addition, the patient had 2 sutures in the right lower neck which were both removed. The patient tolerated the procedure well with no immediate complications. IMPRESSION: Successful catheter removal as described above. Electronically Signed   By: Markus Daft M.D.   On: 04/27/2018 13:02        Scheduled Meds: . calcitRIOL  0.25 mcg Oral QODAY  . Chlorhexidine Gluconate Cloth  6 each Topical Q0600  . feeding supplement (NEPRO CARB STEADY)  237 mL Oral BID BM  . feeding supplement (PRO-STAT SUGAR FREE 64)  30 mL Oral BID  . insulin aspart  5 Units Subcutaneous TID WC  . insulin glargine  5 Units Subcutaneous QHS  . metoprolol tartrate  25 mg Oral BID  . multivitamin  1 tablet Oral QHS  . sodium chloride flush  3 mL Intravenous Q12H   Continuous Infusions: . sodium chloride Stopped (04/27/18 1801)  . anidulafungin 100 mg (04/27/18 2121)  . ceFEPime (MAXIPIME) IV 1 g (04/27/18 2117)  . heparin 1,100 Units/hr (04/28/18 0117)  . [START ON 04/29/2018] vancomycin       LOS: 2 days    Time spent: 25 mins.More than 50% of that time was spent in counseling and/or coordination of care.      Shelly Coss, MD [facility] Pager [phone]  If 7PM-7AM, please contact night-coverage [email or web address] Password [email or web address] 04/28/2018, 1:36 PM

## 2018-04-28 NOTE — Progress Notes (Signed)
[name], female   DOB: 1956/07/19, 62 y.o.   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  He was readmitted with pleuritic chest pain and because 1 recent blood culture grew Candida parapsilosis.  She was found to have a pulmonary embolus in her left lung.  Chest x-ray shows new right lower lobe infiltrates.  I will continue anidulafungin alone.  I do not think she needs a another round of antibacterial therapy.  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  PLAN: 1. Continue anidulafungin pending antibiotic susceptibilities her Candida species 2. Discontinue vancomycin and cefepime 3. TEE tomorrow  Principal Problem:   Fungemia Active Problems:   Pulmonary embolism ([place])   ESRD (end stage renal disease) ([place])   Anemia   DM (diabetes mellitus), type 2 with renal complications (HCC)   Sepsis (HCC)   Dyspnea   Chest pain   Tachycardia   Scheduled Meds: . calcitRIOL  0.25 mcg Oral QODAY  . Chlorhexidine Gluconate Cloth  6 each Topical Q0600  . feeding supplement (NEPRO CARB STEADY)  237 mL Oral BID BM  . feeding supplement (PRO-STAT SUGAR FREE 64)  30 mL Oral BID  . insulin aspart  5 Units Subcutaneous TID WC  . insulin glargine  5 Units Subcutaneous QHS  . metoprolol tartrate  25 mg Oral BID  . multivitamin  1 tablet Oral QHS  . sodium chloride flush  3 mL Intravenous Q12H   Continuous Infusions: . sodium chloride Stopped (04/27/18 1801)  . anidulafungin 100 mg (04/27/18 2121)  . ceFEPime (MAXIPIME) IV 1 g (04/27/18 2117)  . heparin 1,100 Units/hr (04/28/18 0117)  . [START ON 04/29/2018] vancomycin      PRN Meds:.sodium chloride, acetaminophen **OR** acetaminophen, benzonatate, iopamidol, lidocaine (PF), ondansetron, sodium chloride flush, traMADol, zolpidem   SUBJECTIVE: She has persistent, pleuritic right chest pain.  She has dry cough.  Review of Systems: Review of Systems  Constitutional: Positive for malaise/fatigue. Negative for chills, diaphoresis and fever.  Respiratory: Positive for cough. Negative for sputum production and shortness of breath.   Cardiovascular: Positive for chest pain.  Gastrointestinal: Negative for abdominal pain, diarrhea, nausea and vomiting.  Skin: Negative for rash.    No Known Allergies  OBJECTIVE: Vitals:   04/28/18 0849 04/28/18 0900 04/28/18 1144 04/28/18 1424  BP: (!) 129/94  101/66 103/66  Pulse:    93  Resp:   (!) 25 20  Temp:  98.6 F (37 C) 98.8 F (37.1 C) 98.2 F (36.8 C)  TempSrc:  Oral  Oral  SpO2:    99%  Weight:       Body mass index is 25.41 kg/m.  Physical Exam  Constitutional: She is oriented to person, place, and time.  She appears uncomfortable due to her chest pain.  She is sitting up in a chair.  Cardiovascular: Normal rate, regular rhythm and normal heart sounds.  No murmur heard. Pulmonary/Chest: Effort normal and breath sounds normal.  Neurological: She is alert and oriented to person, place, and time.  Skin:  No rash noted.    Lab Results Lab Results  Component Value Date   WBC 4.8 04/28/2018   HGB 10.2 (L) 04/28/2018   HCT 33.6 (L) 04/28/2018   MCV 80.8 04/28/2018   PLT 393 04/28/2018    Lab Results  Component Value Date   CREATININE 5.22 (H) 04/27/2018   BUN 22 04/27/2018   NA 136 04/27/2018   K 3.7 04/27/2018   CL 100 04/27/2018   CO2 26 04/27/2018    Lab Results  Component Value Date   ALT 14 04/27/2018   AST 28 04/27/2018   ALKPHOS 113 04/27/2018   BILITOT 0.6 04/27/2018     Microbiology: Recent Results (from the past 240 hour(s))  Culture, blood (routine x 2)     Status:  Abnormal   Collection Time: 04/23/18  5:00 PM  Result Value Ref Range Status   Specimen Description BLOOD RIGHT ANTECUBITAL  Final   Special Requests   Final    BOTTLES DRAWN AEROBIC AND ANAEROBIC Blood Culture adequate volume   Culture  Setup Time   Final    YEAST AEROBIC BOTTLE ONLY CRITICAL RESULT CALLED TO, READ BACK BY AND VERIFIED WITH: DR Sloan Leiter [phone] MLM Performed at [facility], [place] [address], [place], [place] [address]    Culture CANDIDA PARAPSILOSIS (A)  Final   Report Status 04/27/2018 FINAL  Final  Blood Culture ID Panel (Reflexed)     Status: Abnormal   Collection Time: 04/23/18  5:00 PM  Result Value Ref Range Status   Enterococcus species NOT DETECTED NOT DETECTED Final   Listeria monocytogenes NOT DETECTED NOT DETECTED Final   Staphylococcus species NOT DETECTED NOT DETECTED Final   Staphylococcus aureus NOT DETECTED NOT DETECTED Final   Streptococcus species NOT DETECTED NOT DETECTED Final   Streptococcus agalactiae NOT DETECTED NOT DETECTED Final   Streptococcus pneumoniae NOT DETECTED NOT DETECTED Final   Streptococcus pyogenes NOT DETECTED NOT DETECTED Final   Acinetobacter baumannii NOT DETECTED NOT DETECTED Final   Enterobacteriaceae species NOT DETECTED NOT DETECTED Final   Enterobacter cloacae complex NOT DETECTED NOT DETECTED Final   Escherichia coli NOT DETECTED NOT DETECTED Final   Klebsiella oxytoca NOT DETECTED NOT DETECTED Final   Klebsiella pneumoniae NOT DETECTED NOT DETECTED Final   Proteus species NOT DETECTED NOT DETECTED Final   Serratia marcescens NOT DETECTED NOT DETECTED Final   Haemophilus influenzae NOT DETECTED NOT DETECTED Final   Neisseria meningitidis NOT DETECTED NOT DETECTED Final   Pseudomonas aeruginosa NOT DETECTED NOT DETECTED Final   Candida albicans NOT DETECTED NOT DETECTED Final   Candida glabrata NOT DETECTED NOT DETECTED Final   Candida krusei NOT DETECTED NOT DETECTED Final   Candida parapsilosis DETECTED  (A) NOT DETECTED Final    Comment: CRITICAL RESULT CALLED TO, READ BACK BY AND VERIFIED WITH: DR Sloan Leiter [phone] MLM    Candida tropicalis NOT DETECTED NOT DETECTED Final    Comment: Performed at [facility], [address], [place], [place] [address]  Culture, blood (routine x 2)     Status: None   Collection Time: 04/23/18  5:15 PM  Result Value Ref Range Status   Specimen Description BLOOD RIGHT FOREARM  Final   Special Requests   Final    BOTTLES DRAWN AEROBIC AND ANAEROBIC Blood Culture adequate volume   Culture   Final    NO GROWTH 5 DAYS Performed at [facility], [place] [address], [place], [place] [address]  Report Status 04/28/2018 FINAL  Final  Blood culture (routine x 2)     Status: None (Preliminary result)   Collection Time: 04/26/18  4:58 PM  Result Value Ref Range Status   Specimen Description BLOOD RIGHT HAND  Final   Special Requests   Final    BOTTLES DRAWN AEROBIC ONLY Blood Culture results may not be optimal due to an inadequate volume of blood received in culture bottles   Culture   Final    NO GROWTH 2 DAYS Performed at [facility], [place] [address], [place], [place] [address]    Report Status PENDING  Incomplete  Blood culture (routine x 2)     Status: None (Preliminary result)   Collection Time: 04/26/18  6:30 PM  Result Value Ref Range Status   Specimen Description BLOOD RIGHT ANTECUBITAL  Final   Special Requests   Final    BOTTLES DRAWN AEROBIC AND ANAEROBIC Blood Culture adequate volume   Culture   Final    NO GROWTH 2 DAYS Performed at [facility], [place] [address], [place], [place] [address]    Report Status PENDING  Incomplete  MRSA PCR Screening     Status: None   Collection Time: 04/26/18  8:37 PM  Result Value Ref Range Status   MRSA by PCR NEGATIVE NEGATIVE Final    Comment:        The GeneXpert MRSA Assay (FDA approved for NASAL specimens only), is one component of a comprehensive MRSA  colonization surveillance program. It is not intended to diagnose MRSA infection nor to guide or monitor treatment for MRSA infections. Performed at [facility], [place] [address] [place], [place] [address]     Michel Bickers, [place] [facility] [phone] pager   [phone] cell 04/28/2018, 3:02 PM

## 2018-04-28 NOTE — Progress Notes (Addendum)
Progress Note  Patient Name: Rhonda Lynch Date of Encounter: 04/28/2018  Primary Cardiologist:  Gwenlyn Found   Subjective   62 year old female with end-stage renal disease currently on hemodialysis.  She is admitted with chest pain and found to have pulmonary embolus.  Her chest pain is atypical.  She is currently on heparin. Echocardiogram shows normal left ventricular systolic function with ejection fraction of 50 to 55%.  The right the right ventricle is normal.  There was moderate pulmonary hypertension with an estimated PA pressure 43 mmHg.  Inpatient Medications    Scheduled Meds: . calcitRIOL  0.25 mcg Oral QODAY  . insulin aspart  5 Units Subcutaneous TID WC  . insulin glargine  5 Units Subcutaneous QHS  . metoprolol tartrate  25 mg Oral BID  . multivitamin  1 tablet Oral QHS  . sodium chloride flush  3 mL Intravenous Q12H   Continuous Infusions: . sodium chloride Stopped (04/27/18 1801)  . anidulafungin 100 mg (04/27/18 2121)  . ceFEPime (MAXIPIME) IV 1 g (04/27/18 2117)  . heparin 1,100 Units/hr (04/28/18 0117)  . [START ON 04/29/2018] vancomycin     PRN Meds: sodium chloride, acetaminophen **OR** acetaminophen, benzonatate, iopamidol, lidocaine (PF), ondansetron, sodium chloride flush, zolpidem   Vital Signs    Vitals:   04/28/18 0112 04/28/18 0456 04/28/18 0849 04/28/18 0900  BP: (!) 143/97 138/85 (!) 129/94   Pulse:      Resp: 16 (!) 27    Temp: 99 F (37.2 C) 99 F (37.2 C)  98.6 F (37 C)  TempSrc: Oral Oral  Oral  SpO2: 97%     Weight:  61 kg      Intake/Output Summary (Last 24 hours) at 04/28/2018 1025 Last data filed at 04/28/2018 0501 Gross per 24 hour  Intake 372.09 ml  Output -  Net 372.09 ml   Filed Weights   04/27/18 0407 04/28/18 0456  Weight: 59.5 kg 61 kg    Telemetry     NSR  - Personally Reviewed  ECG     NSR  - Personally Reviewed  Physical Exam   GEN: chronically ill appearing female,  Mild general distress   Neck: No  JVD Cardiac: RRR,  Soft systolic murmur   Respiratory: Clear to auscultation bilaterally. GI: Soft, nontender, non-distended  MS: No edema; No deformity. Neuro:  Nonfocal  Psych: Normal affect   Labs    Chemistry Recent Labs  Lab 04/22/18 0815 04/24/18 0621 04/26/18 1726 04/27/18 0706 04/27/18 2357  NA 135 138 138 138 136  K 4.1 3.9 3.9 3.0* 3.7  CL 98 103 97* 97* 100  CO2 26 25 25 28 26   GLUCOSE 133* 121* 153* 119* 113*  BUN 51* 34* 8 17 22   CREATININE 6.10* 5.88* 3.19*  3.14* 4.30* 5.22*  CALCIUM 7.5* 7.7* 7.8* 7.5* 7.9*  PROT  --   --   --  5.3*  --   ALBUMIN 1.6* 1.5*  --  1.6*  --   AST  --   --   --  28  --   ALT  --   --   --  14  --   ALKPHOS  --   --   --  113  --   BILITOT  --   --   --  0.6  --   GFRNONAA 7* 7* 14*  15* 10* 8*  GFRAA 8* 8* 17*  17* 12* 9*  ANIONGAP 11 10 16* 13 10  Hematology Recent Labs  Lab 04/26/18 1726 04/27/18 0706 04/27/18 2357  WBC 7.9 4.7 4.7  RBC 4.64 3.59* 3.52*  HGB 11.5* 8.8* 8.7*  HCT 38.1 28.6* 27.8*  MCV 82.1 79.7 79.0  MCH 24.8* 24.5* 24.7*  MCHC 30.2 30.8 31.3  RDW 19.6* 18.8* 19.4*  PLT 264 237 283    Cardiac Enzymes Recent Labs  Lab 04/26/18 1726 04/27/18 0049 04/27/18 0706  TROPONINI 0.04* 0.07* 0.04*    Recent Labs  Lab 04/26/18 1739  TROPIPOC 0.06     BNPNo results for input(s): BNP, PROBNP in the last 168 hours.   DDimer  Recent Labs  Lab 04/26/18 2018  DDIMER 10.38*     Radiology    Dg Chest 2 View  Result Date: 04/26/2018 CLINICAL DATA:  Chest pain altered mental status and sepsis. EXAM: CHEST - 2 VIEW COMPARISON:  04/17/2018 and prior radiographs FINDINGS: New RIGHT LOWER lung airspace disease/consolidation likely represents pneumonia. Mild cardiomegaly and RIGHT central venous catheter again noted. No definite pleural effusion or pneumothorax. The LEFT lung is clear. IMPRESSION: RIGHT LOWER lung airspace disease/consolidation likely representing pneumonia. Electronically  Signed   By: Margarette Canada M.D.   On: 04/26/2018 18:34   Nm Pulmonary Perf And Vent  Result Date: 04/27/2018 CLINICAL DATA:  Shortness of breath EXAM: NUCLEAR MEDICINE VENTILATION - PERFUSION LUNG SCAN VIEWS: Anterior, posterior, left lateral, right lateral, RAO, LAO, RPO, LPO-ventilation perfusion RADIOPHARMACEUTICALS:  32.3 mCi of Tc-56m DTPA aerosol inhalation and 4.2 mCi Tc53m-MAA IV COMPARISON:  Ventilation perfusion lung scans March 20, 2018; chest radiograph April 26, 2018 FINDINGS: Ventilation: The distribution of uptake on the ventilation study is homogeneous and symmetric without appreciable change from prior study. Perfusion: There is a focal perfusion defect in the superior segment of the left lower lobe, not present on prior lung scan examination. There is also a perfusion defect in the anterior segment of the left lower lobe, not present on prior lung scan examination. There are no corresponding ventilation defects or left lower lobe chest radiographic abnormality. No perfusion defects are noted elsewhere. IMPRESSION: There are 2 segmental level perfusion defects in the left lower lobe which were not present on prior lung scan and are not appreciable on the ventilation study. This study therefore constitutes a high probability of pulmonary embolus based on PIOPED II criteria. Critical Value/emergent results were called by telephone at the time of interpretation on 04/27/2018 at 2:37 pm to Dr. Shelly Coss , who verbally acknowledged these results. Electronically Signed   By: Lowella Grip III M.D.   On: 04/27/2018 14:38   Ir Removal Tun Cv Cath W/o Fl  Result Date: 04/27/2018 INDICATION: 62 year old with end-stage renal disease and tunneled dialysis catheter. Patient has a Candida infection and needs a removal of the dialysis catheter. EXAM: REMOVAL TUNNELED DIALYSIS CATHETER MEDICATIONS: None ANESTHESIA/SEDATION: None FLUOROSCOPY TIME:  None COMPLICATIONS: None immediate. PROCEDURE:  Informed written consent was obtained from the patient after a thorough discussion of the procedural risks, benefits and alternatives. All questions were addressed. Maximal Sterile Barrier Technique was utilized including caps, mask, sterile gowns, sterile gloves, sterile drape, hand hygiene and skin antiseptic. A timeout was performed prior to the initiation of the procedure. [name] right chest and catheter was prepped and draped in a normal sterile fashion. Heparin was removed from both ports of catheter. 1% lidocaine was used for local anesthesia. Using gentle blunt dissection the cuff of the catheter was exposed and the catheter was removed in it's entirety. Pressure  was held till hemostasis was obtained. A sterile dressing was applied. In addition, the patient had 2 sutures in the right lower neck which were both removed. The patient tolerated the procedure well with no immediate complications. IMPRESSION: Successful catheter removal as described above. Electronically Signed   By: Markus Daft M.D.   On: 04/27/2018 13:02    Cardiac Studies     Patient Profile     62 y.o. female admitted with chest pain and found to have pulmonary emboli.  She also has Candida fungemia.  Assessment & Plan    1.  Chest pain: The chest pain is very atypical.  Troponin level is minimally elevated and is more consistent with a pulmonary embolus rather than an acute coronary syndrome.  2.  Pulmonary embolus: She is currently on heparin.  She will eventually need to be transitioned to warfarin.  Further management per internal medicine.  She will need to follow with her primary medical doctor or could follow up in our coumadin clinic  for management of her INR levels.  3.  Candida fungemia:   Scheduled for TEE tomorrow with Dr. Radford Pax. Have discussed procedure, risks, benefits, options. She agrees to proceed.   .     [facility] will sign off.   Medication Recommendations:  Transition from Heparin  to  coumadin per Int. Medicine team  Other recommendations (labs, testing, etc):   Follow up as an outpatient:  With her primary MD.    She will need to have her INR monitored.   Since she will be getting hemodialysis, this ideally would be managed  at the dialysis center.   Alternatively, she could have her INR levels monitored by her primary MD or our coumadin clinic.      For questions or updates, please contact [place] Please consult [email or web address] for contact info under Cardiology/STEMI.      Signed, Mertie Moores, MD  04/28/2018, 10:25 AM

## 2018-04-29 ENCOUNTER — Inpatient Hospital Stay (HOSPITAL_COMMUNITY): Payer: Medicaid Other

## 2018-04-29 ENCOUNTER — Encounter (HOSPITAL_COMMUNITY): Payer: Self-pay

## 2018-04-29 ENCOUNTER — Inpatient Hospital Stay (HOSPITAL_COMMUNITY): Payer: Medicaid Other | Admitting: Certified Registered Nurse Anesthetist

## 2018-04-29 ENCOUNTER — Inpatient Hospital Stay (HOSPITAL_COMMUNITY): Payer: Medicaid Other | Admitting: Anesthesiology

## 2018-04-29 ENCOUNTER — Encounter (HOSPITAL_COMMUNITY): Payer: Self-pay | Admitting: Certified Registered Nurse Anesthetist

## 2018-04-29 ENCOUNTER — Encounter (HOSPITAL_COMMUNITY)
Admission: EM | Disposition: A | Discharge status: Home Health | Payer: Self-pay | Source: Home / Self Care | Attending: Internal Medicine

## 2018-04-29 DIAGNOSIS — Z992 Dependence on renal dialysis: Secondary | ICD-10-CM

## 2018-04-29 DIAGNOSIS — I340 Nonrheumatic mitral (valve) insufficiency: Secondary | ICD-10-CM

## 2018-04-29 DIAGNOSIS — T82898A Other specified complication of vascular prosthetic devices, implants and grafts, initial encounter: Secondary | ICD-10-CM

## 2018-04-29 DIAGNOSIS — N186 End stage renal disease: Secondary | ICD-10-CM

## 2018-04-29 HISTORY — PX: INSERTION OF DIALYSIS CATHETER: SHX1324

## 2018-04-29 HISTORY — PX: TEE WITHOUT CARDIOVERSION: SHX5443

## 2018-04-29 LAB — GLUCOSE, CAPILLARY
GLUCOSE-CAPILLARY: 86 mg/dL (ref 70–99)
GLUCOSE-CAPILLARY: 79 mg/dL (ref 70–99)
Glucose-Capillary: 92 mg/dL (ref 70–99)
Glucose-Capillary: 70 mg/dL (ref 70–99)

## 2018-04-29 LAB — PROTIME-INR
INR: 1.27
PROTHROMBIN TIME: 15.8 s — AB (ref 11.4–15.2)

## 2018-04-29 LAB — RENAL FUNCTION PANEL
Albumin: 1.6 g/dL — ABNORMAL LOW (ref 3.5–5.0)
Anion gap: 8 (ref 5–15)
BUN: 30 mg/dL — ABNORMAL HIGH (ref 8–23)
CO2: 23 mmol/L (ref 22–32)
Calcium: 7.9 mg/dL — ABNORMAL LOW (ref 8.9–10.3)
Chloride: 105 mmol/L (ref 98–111)
Creatinine, Ser: 7.21 mg/dL — ABNORMAL HIGH (ref 0.44–1.00)
GFR calc Af Amer: 6 mL/min — ABNORMAL LOW (ref >60)
GFR calc non Af Amer: 5 mL/min — ABNORMAL LOW (ref >60)
Glucose, Bld: 91 mg/dL (ref 70–99)
Phosphorus: 5.2 mg/dL — ABNORMAL HIGH (ref 2.5–4.6)
Potassium: 4.3 mmol/L (ref 3.5–5.1)
Sodium: 136 mmol/L (ref 135–145)

## 2018-04-29 LAB — CBC
HCT: 29.9 % — ABNORMAL LOW (ref 36.0–46.0)
HCT: 29.4 % — ABNORMAL LOW (ref 36.0–46.0)
HEMOGLOBIN: 9.1 g/dL — AB (ref 12.0–15.0)
Hemoglobin: 8.8 g/dL — ABNORMAL LOW (ref 12.0–15.0)
MCH: 24.3 pg — AB (ref 26.0–34.0)
MCH: 24.3 pg — ABNORMAL LOW (ref 26.0–34.0)
MCHC: 30.4 g/dL (ref 30.0–36.0)
MCHC: 29.9 g/dL — ABNORMAL LOW (ref 30.0–36.0)
MCV: 79.7 fL (ref 78.0–100.0)
MCV: 81.2 fL (ref 78.0–100.0)
Platelets: 325 10*3/uL (ref 150–400)
Platelets: 365 10*3/uL (ref 150–400)
RBC: 3.75 MIL/uL — AB (ref 3.87–5.11)
RBC: 3.62 MIL/uL — ABNORMAL LOW (ref 3.87–5.11)
RDW: 19.8 % — ABNORMAL HIGH (ref 11.5–15.5)
RDW: 20.1 % — ABNORMAL HIGH (ref 11.5–15.5)
WBC: 5.3 10*3/uL (ref 4.0–10.5)
WBC: 4.8 10*3/uL (ref 4.0–10.5)

## 2018-04-29 LAB — BASIC METABOLIC PANEL
Anion gap: 12 (ref 5–15)
BUN: 31 mg/dL — AB (ref 8–23)
CHLORIDE: 104 mmol/L (ref 98–111)
CO2: 22 mmol/L (ref 22–32)
Calcium: 8.2 mg/dL — ABNORMAL LOW (ref 8.9–10.3)
Creatinine, Ser: 6.42 mg/dL — ABNORMAL HIGH (ref 0.44–1.00)
GFR calc Af Amer: 7 mL/min — ABNORMAL LOW (ref >60)
GFR calc non Af Amer: 6 mL/min — ABNORMAL LOW (ref >60)
Glucose, Bld: 109 mg/dL — ABNORMAL HIGH (ref 70–99)
POTASSIUM: 4.5 mmol/L (ref 3.5–5.1)
SODIUM: 138 mmol/L (ref 135–145)

## 2018-04-29 LAB — HEPARIN LEVEL (UNFRACTIONATED): HEPARIN UNFRACTIONATED: 0.6 [IU]/mL (ref 0.30–0.70)

## 2018-04-29 SURGERY — INSERTION OF DIALYSIS CATHETER
Anesthesia: General | Site: Chest | Laterality: Left

## 2018-04-29 SURGERY — ECHOCARDIOGRAM, TRANSESOPHAGEAL
Anesthesia: Monitor Anesthesia Care

## 2018-04-29 MED ORDER — PROPOFOL 500 MG/50ML IV EMUL
INTRAVENOUS | Status: DC | PRN
  Administered 2018-04-29: 100 ug/kg/min via INTRAVENOUS

## 2018-04-29 MED ORDER — OXYCODONE HCL 5 MG PO TABS: 5.0000 mg | ORAL_TABLET | Freq: Once | ORAL | Status: DC | PRN

## 2018-04-29 MED ORDER — BUTAMBEN-TETRACAINE-BENZOCAINE 2-2-14 % EX AERO
INHALATION_SPRAY | CUTANEOUS | Status: DC | PRN
  Administered 2018-04-29: 1 via TOPICAL

## 2018-04-29 MED ORDER — SODIUM CHLORIDE 0.9 % IV SOLN: 100.0000 mL | INTRAVENOUS | Status: DC | PRN

## 2018-04-29 MED ORDER — HEPARIN SODIUM (PORCINE) 1000 UNIT/ML IJ SOLN
INTRAMUSCULAR | Status: AC
  Filled 2018-04-29: qty 1

## 2018-04-29 MED ORDER — SODIUM CHLORIDE 0.9 % IV SOLN
INTRAVENOUS | Status: DC | PRN
  Administered 2018-04-29: 13:00:00

## 2018-04-29 MED ORDER — HEPARIN SODIUM (PORCINE) 1000 UNIT/ML IJ SOLN
INTRAMUSCULAR | Status: DC | PRN
  Administered 2018-04-29: 3400 [IU]

## 2018-04-29 MED ORDER — SODIUM CHLORIDE 0.9 % IV SOLN
INTRAVENOUS | Status: AC
  Filled 2018-04-29: qty 1.2

## 2018-04-29 MED ORDER — LIDOCAINE HCL (PF) 1 % IJ SOLN: 5.0000 mL | INTRAMUSCULAR | Status: DC | PRN

## 2018-04-29 MED ORDER — HEPARIN (PORCINE) IN NACL 100-0.45 UNIT/ML-% IJ SOLN: 11.0000 [IU]/kg/h | INTRAMUSCULAR | Status: DC

## 2018-04-29 MED ORDER — HEPARIN (PORCINE) IN NACL 100-0.45 UNIT/ML-% IJ SOLN
1100.0000 [IU]/h | INTRAMUSCULAR | Status: DC
  Administered 2018-04-29: 1100 [IU]/h via INTRAVENOUS
  Filled 2018-04-29: qty 250

## 2018-04-29 MED ORDER — LIDOCAINE-EPINEPHRINE (PF) 1 %-1:200000 IJ SOLN
INTRAMUSCULAR | Status: DC | PRN
  Administered 2018-04-29: 10 mL

## 2018-04-29 MED ORDER — LIDOCAINE-EPINEPHRINE (PF) 1 %-1:200000 IJ SOLN
INTRAMUSCULAR | Status: AC
  Filled 2018-04-29: qty 30

## 2018-04-29 MED ORDER — PENTAFLUOROPROP-TETRAFLUOROETH EX AERO: 1.0000 "application " | INHALATION_SPRAY | CUTANEOUS | Status: DC | PRN

## 2018-04-29 MED ORDER — ALTEPLASE 2 MG IJ SOLR: 2.0000 mg | Freq: Once | INTRAMUSCULAR | Status: DC | PRN

## 2018-04-29 MED ORDER — PROPOFOL 500 MG/50ML IV EMUL
INTRAVENOUS | Status: DC | PRN
  Administered 2018-04-29: 75 ug/kg/min via INTRAVENOUS

## 2018-04-29 MED ORDER — OXYCODONE HCL 5 MG/5ML PO SOLN: 5.0000 mg | Freq: Once | ORAL | Status: DC | PRN

## 2018-04-29 MED ORDER — HEPARIN SODIUM (PORCINE) 1000 UNIT/ML DIALYSIS: 1000.0000 [IU] | INTRAMUSCULAR | Status: DC | PRN

## 2018-04-29 MED ORDER — ONDANSETRON HCL 4 MG/2ML IJ SOLN: 4.0000 mg | Freq: Once | INTRAMUSCULAR | Status: DC | PRN

## 2018-04-29 MED ORDER — PROPOFOL 10 MG/ML IV BOLUS
INTRAVENOUS | Status: DC | PRN
  Administered 2018-04-29 (×2): 10 mg via INTRAVENOUS

## 2018-04-29 MED ORDER — LIDOCAINE-PRILOCAINE 2.5-2.5 % EX CREA: 1.0000 "application " | TOPICAL_CREAM | CUTANEOUS | Status: DC | PRN

## 2018-04-29 MED ORDER — 0.9 % SODIUM CHLORIDE (POUR BTL) OPTIME
TOPICAL | Status: DC | PRN
  Administered 2018-04-29: 1000 mL

## 2018-04-29 MED ORDER — WARFARIN SODIUM 5 MG PO TABS: 5.0000 mg | ORAL_TABLET | Freq: Once | ORAL | Status: DC

## 2018-04-29 MED ORDER — METOPROLOL TARTRATE 12.5 MG HALF TABLET
ORAL_TABLET | ORAL | Status: AC
  Administered 2018-04-29: 25 mg via ORAL
  Filled 2018-04-29: qty 2

## 2018-04-29 MED ORDER — SODIUM CHLORIDE 0.9 % IV SOLN
INTRAVENOUS | Status: DC | PRN
  Administered 2018-04-29: 08:00:00 via INTRAVENOUS

## 2018-04-29 MED ORDER — OXYCODONE-ACETAMINOPHEN 5-325 MG PO TABS
1.0000 | ORAL_TABLET | ORAL | Status: DC | PRN
  Administered 2018-04-30 – 2018-05-03 (×4): 2 via ORAL
  Filled 2018-04-29 (×3): qty 2
  Filled 2018-04-29: qty 1
  Filled 2018-04-29: qty 2

## 2018-04-29 MED ORDER — WARFARIN SODIUM 7.5 MG PO TABS: 7.5000 mg | ORAL_TABLET | Freq: Once | ORAL | Status: DC

## 2018-04-29 MED ORDER — WARFARIN SODIUM 7.5 MG PO TABS
7.5000 mg | ORAL_TABLET | Freq: Once | ORAL | Status: AC
  Administered 2018-04-29: 7.5 mg via ORAL
  Filled 2018-04-29: qty 1

## 2018-04-29 MED ORDER — FENTANYL CITRATE (PF) 100 MCG/2ML IJ SOLN: 25.0000 ug | INTRAMUSCULAR | Status: DC | PRN

## 2018-04-29 MED ORDER — LIDOCAINE HCL (CARDIAC) PF 100 MG/5ML IV SOSY
PREFILLED_SYRINGE | INTRAVENOUS | Status: DC | PRN
  Administered 2018-04-29: 40 mg via INTRAVENOUS

## 2018-04-29 MED ORDER — FENTANYL CITRATE (PF) 250 MCG/5ML IJ SOLN
INTRAMUSCULAR | Status: AC
  Filled 2018-04-29: qty 5

## 2018-04-29 MED ORDER — SODIUM CHLORIDE 0.9 % IV SOLN
INTRAVENOUS | Status: DC
  Administered 2018-04-29: 21:00:00 via INTRAVENOUS

## 2018-04-29 SURGICAL SUPPLY — 41 items
ADH SKN CLS APL DERMABOND .7 (GAUZE/BANDAGES/DRESSINGS) ×1
BAG DECANTER FOR FLEXI CONT (MISCELLANEOUS) ×3 IMPLANT
BIOPATCH RED 1 DISK 7.0 (GAUZE/BANDAGES/DRESSINGS) ×2 IMPLANT
BIOPATCH RED 1IN DISK 7.0MM (GAUZE/BANDAGES/DRESSINGS) ×1
CATH PALINDROME RT-P 15FX19CM (CATHETERS) IMPLANT
CATH PALINDROME RT-P 15FX23CM (CATHETERS) ×2 IMPLANT
CATH PALINDROME RT-P 15FX28CM (CATHETERS) IMPLANT
CATH PALINDROME RT-P 15FX55CM (CATHETERS) IMPLANT
CHLORAPREP W/TINT 26ML (MISCELLANEOUS) ×3 IMPLANT
COVER PROBE W GEL 5X96 (DRAPES) ×2 IMPLANT
COVER SURGICAL LIGHT HANDLE (MISCELLANEOUS) ×3 IMPLANT
DERMABOND ADVANCED (GAUZE/BANDAGES/DRESSINGS) ×2
DERMABOND ADVANCED .7 DNX12 (GAUZE/BANDAGES/DRESSINGS) IMPLANT
DRAPE C-ARM 42X72 X-RAY (DRAPES) ×3 IMPLANT
DRAPE CHEST BREAST 15X10 FENES (DRAPES) ×3 IMPLANT
DRSG COVADERM 4X6 (GAUZE/BANDAGES/DRESSINGS) ×2 IMPLANT
GAUZE 4X4 16PLY RFD (DISPOSABLE) ×3 IMPLANT
GAUZE SPONGE 4X4 12PLY STRL LF (GAUZE/BANDAGES/DRESSINGS) ×2 IMPLANT
GLOVE BIO SURGEON STRL SZ7.5 (GLOVE) ×3 IMPLANT
GLOVE BIOGEL PI IND STRL 8 (GLOVE) ×1 IMPLANT
GLOVE BIOGEL PI INDICATOR 8 (GLOVE) ×2
GOWN STRL REUS W/ TWL LRG LVL3 (GOWN DISPOSABLE) ×2 IMPLANT
GOWN STRL REUS W/TWL LRG LVL3 (GOWN DISPOSABLE) ×6
KIT BASIN OR (CUSTOM PROCEDURE TRAY) ×3 IMPLANT
KIT TURNOVER KIT B (KITS) ×3 IMPLANT
NDL 18GX1X1/2 (RX/OR ONLY) (NEEDLE) ×1 IMPLANT
NDL HYPO 25GX1X1/2 BEV (NEEDLE) ×1 IMPLANT
NEEDLE 18GX1X1/2 (RX/OR ONLY) (NEEDLE) ×3 IMPLANT
NEEDLE HYPO 25GX1X1/2 BEV (NEEDLE) ×3 IMPLANT
NS IRRIG 1000ML POUR BTL (IV SOLUTION) ×3 IMPLANT
PACK SURGICAL SETUP 50X90 (CUSTOM PROCEDURE TRAY) ×3 IMPLANT
PAD ARMBOARD 7.5X6 YLW CONV (MISCELLANEOUS) ×6 IMPLANT
SUT ETHILON 3 0 PS 1 (SUTURE) ×3 IMPLANT
SUT VICRYL 4-0 PS2 18IN ABS (SUTURE) ×3 IMPLANT
SYR 10ML LL (SYRINGE) ×3 IMPLANT
SYR 20CC LL (SYRINGE) ×6 IMPLANT
SYR 5ML LL (SYRINGE) ×6 IMPLANT
SYR CONTROL 10ML LL (SYRINGE) ×3 IMPLANT
TOWEL GREEN STERILE (TOWEL DISPOSABLE) ×6 IMPLANT
TOWEL GREEN STERILE FF (TOWEL DISPOSABLE) ×3 IMPLANT
WATER STERILE IRR 1000ML POUR (IV SOLUTION) ×3 IMPLANT

## 2018-04-29 NOTE — Op Note (Signed)
    NAME: BILL MCVEY    MRN: [id number] DOB: 10-Aug-1956    DATE OF OPERATION: 04/29/2018  PREOP DIAGNOSIS:    End stage renal disease  POSTOP DIAGNOSIS:    Same  PROCEDURE:    Ultrasound-guided placement of right IJ 23 cm tunneled dialysis catheter  SURGEON: Judeth Cornfield. Scot Dock, MD, FACS  ASSIST: None  ANESTHESIA: Local with sedation  EBL: Minimal  INDICATIONS:    YOSELYN MCGLADE is a 62 y.o. female who had a left brachiocephalic fistula placed approximately 2 months ago.  This was not functioning.  Her catheter was removed yesterday because of possible infection.  However cultures were negative and therefore it was felt that it would be safe to proceed with placement of a new catheter.  FINDINGS:   I had a difficult time passing the wire in the left IJ therefore I went back to the right side.  TECHNIQUE:   The patient was taken to the operating room and sedated by anesthesia.  The neck and upper chest were prepped and draped in usual sterile fashion.  Under ultrasound guidance I cannulated the left IJ however I had a difficult time passing the wire.  By duplex the vein appeared to narrow down some.  I therefore elected to place a right IJ approach.  Under ultrasound guidance, after the skin was anesthetized, the right IJ was cannulated and a guidewire introduced into the superior vena cava under fluoroscopic control.  The tract over the wire was dilated and then a dilator and peel-away sheath were advanced over the wire and the wire and dilator removed.  The catheter was passed through the peel-away sheath and positioned in the right atrium.  The peel-away sheath was removed.  The exit site for the catheter was selected and the skin anesthetized between the 2 areas.  The catheter was brought through the tunnel cut the appropriate length and the distal ports were attached.  Both ports withdrew easily with and flushed with heparinized saline and filled with concentrated  heparin.  The catheter was secured at its exit site with a 3-0 nylon suture.  The IJ cannulation site was closed with a 4-0 subcuticular stitch.  Sterile dressing was applied.  Patient tolerated the procedure well was transferred to the recovery room in stable condition.  All needle and sponge counts were correct.  Deitra Mayo, MD, FACS [facility]  DATE OF DICTATION:   04/29/2018

## 2018-04-29 NOTE — Progress Notes (Signed)
This nurse assuming care of pt.  Pt appears stable, denies pain, sob or discomfort.  No assessment of pt done at [facility] time as pt called to have TEE and then to dialysis after.  [id number]

## 2018-04-29 NOTE — Progress Notes (Addendum)
PROGRESS NOTE    Rhonda Lynch  ZOX:[id number] DOB: 12/23/1955 DOA: 04/26/2018 PCP: Clent Demark, PA-C   Brief Narrative: Patient is a 64 old female with past medical history of ESRD on dialysis on Tuesday, ANCA related vasculitis, Thursday and Saturday, hypertension, diabetes mellitus who was brought back for admission because of positive blood culture for Candida.  She was recently discharged from here after being treated for pneumonia.  Patient was also complaining of some shortness of breath and chest pain at home.  Patient admitted for further management.  Patient also found to have pulmonary embolism as per the VQ scan.  Currently she is  Being managed  For PE, Candida fungemia, healthcare associated pneumonia.  Cardiology, nephrology and ID are following.  Assessment & Plan:   Principal Problem:   Fungemia Active Problems:   ESRD (end stage renal disease) ([place])   Anemia   DM (diabetes mellitus), type 2 with renal complications ([facility])   Sepsis ([place])   Dyspnea   Chest pain   Tachycardia   Pulmonary embolism ([place])   Hemodialysis catheter infection, initial encounter ([place])   Fungemia: Blood cultures that were drawn on her last visit showed Candida parapsilosis.  Started on Eraxis.  ID following.  Hemodialysis  catheter removed.  TEE done today could not rule out vegetation in the tricuspid valve.  Showed  moderately thickened tricuspid valve leaflet.  PE/Elevated d-dimer:  D-dimer found to be elevated.   VQ scan which showed hyper probability of PE.  Started on heparin drip.  She was started on bridging with Coumadin .  She will follow-up with Coumadin clinic or her primary care physician to monitor her INR.    Underwent right IJ tunneled catheter placement today by vascular surgery.  Healthcare associated pneumonia: Was discharged on IV cefazolin and Augmentin.  Currently she is afebrile and hemodynamically stable.  Started on Vanco and cefepime on this admission.  Chest x-ray  done here showed RIGHT LOWER lung airspace disease/consolidation likely representing pneumonia but that was most likely her previous pneumonia.  Patient does not have any respiratory distress right now.  Antibiotics discontinued.  ESRD on dialysis: Nephrology following.  Permanent dialysis catheter removed and new one put.  She has AV fistula placed in left upper extremity on 02/2618 but did not function.    Chest pain: Currently resolved.  Most likely atypical chest pain.  Cardiology was following.  Troponin mild elevated and has not trended up.  Dose of metoprolol increased.   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  Currently H&H is stable.  Thrombocytopenia: Chronic  Diabetes type 2: Continue current regimen.  We will continue to monitor CBC.  History of BOOP: Completed a course of steroids.  Hypokalemia: Supplemented with potassium.  We will continue to monitor the levels.  Lactic acidosis: Resolved.   DVT prophylaxis:Heparin IV Code Status: Full Family Communication: None present at the bedside Disposition Plan: Needs further work-up   Consultants: Nephrology, cardiology, IR, ID  Procedures: insertion of  HD catheter  Antimicrobials: Eraxis  Subjective: Patient seen and examined the bedside this morning at the dialysis facility.  Currently her shortness of breath and chest pain have resolved.  She is a hemodynamically stable.  Afebrile.     Objective: Vitals:   04/29/18 1350 04/29/18 1405 04/29/18 1420 04/29/18 1435  BP: 123/78 132/82 138/89 137/90  Pulse: 91 88 87 86  Resp: (!) 22 (!) 23 19 (!) 21  Temp: (!) 97.5  F (36.4 C)   (!) 97.5 F (36.4 C)  TempSrc:      SpO2: 100% 100% 100% 100%  Weight:      Height:        Intake/Output Summary (Last 24 hours) at 04/29/2018 1528 Last data filed at 04/29/2018 1340 Gross per 24 hour  Intake 618.7 ml  Output 2 ml  Net 616.7 ml   Filed Weights   04/27/18 0407 04/28/18  0456 04/29/18 0652  Weight: 59.5 kg 61 kg 62.1 kg    Examination:  General exam: Not in distress, generalized weakness HEENT:PERRL,Oral mucosa moist, Ear/Nose normal on gross exam Respiratory system: Decreased air entry in the bases  cardiovascular system: S1 & S2 heard, RRR. No JVD, murmurs, rubs, gallops or clicks. Trace pedal edema. Gastrointestinal system: Abdomen is nondistended, soft and nontender. No organomegaly or masses felt. Normal bowel sounds heard. Central nervous system: Alert and oriented. No focal neurological deficits. Extremities: trace edema, no clubbing ,no cyanosis, distal peripheral pulses palpable.  Fistula on her left arm Skin: No rashes, lesions or ulcers,no icterus ,no pallor MSK: Normal muscle bulk,tone ,power Psychiatry: Judgement and insight appear normal. Mood & affect appropriate.     Data Reviewed: I have personally reviewed following labs and imaging studies  CBC: Recent Labs  Lab 04/27/18 0706 04/27/18 2357 04/28/18 1421 04/29/18 0605 04/29/18 1038  WBC 4.7 4.7 4.8 5.3 4.8  HGB 8.8* 8.7* 10.2* 9.1* 8.8*  HCT 28.6* 27.8* 33.6* 29.9* 29.4*  MCV 79.7 79.0 80.8 79.7 81.2  PLT 237 283 393 325 099   Basic Metabolic Panel: Recent Labs  Lab 04/24/18 0621 04/26/18 1726 04/27/18 0706 04/27/18 2357 04/29/18 0605 04/29/18 1038  NA 138 138 138 136 138 136  K 3.9 3.9 3.0* 3.7 4.5 4.3  CL 103 97* 97* 100 104 105  CO2 25 25 28 26 22 23   GLUCOSE 121* 153* 119* 113* 109* 91  BUN 34* 8 17 22  31* 30*  CREATININE 5.88* 3.19*  3.14* 4.30* 5.22* 6.42* 7.21*  CALCIUM 7.7* 7.8* 7.5* 7.9* 8.2* 7.9*  MG  --   --  1.7  --   --   --   PHOS 5.2*  --   --   --   --  5.2*   GFR: Estimated Creatinine Clearance: 6.7 mL/min (A) (by C-G formula based on SCr of 7.21 mg/dL (H)). Liver Function Tests: Recent Labs  Lab 04/24/18 0621 04/27/18 0706 04/29/18 1038  AST  --  28  --   ALT  --  14  --   ALKPHOS  --  113  --   BILITOT  --  0.6  --   PROT  --   5.3*  --   ALBUMIN 1.5* 1.6* 1.6*   No results for input(s): LIPASE, AMYLASE in the last 168 hours. No results for input(s): AMMONIA in the last 168 hours. Coagulation Profile: Recent Labs  Lab 04/28/18 1421 04/29/18 0605  INR 1.00 1.27   Cardiac Enzymes: Recent Labs  Lab 04/26/18 1726 04/27/18 0049 04/27/18 0706  TROPONINI 0.04* 0.07* 0.04*   BNP (last 3 results) No results for input(s): PROBNP in the last 8760 hours. HbA1C: No results for input(s): HGBA1C in the last 72 hours. CBG: Recent Labs  Lab 04/28/18 1152 04/28/18 1644 04/28/18 2129 04/29/18 1053 04/29/18 1351  GLUCAP 149* 155* 82 92 86   Lipid Profile: No results for input(s): CHOL, HDL, LDLCALC, TRIG, CHOLHDL, LDLDIRECT in the last 72 hours. Thyroid Function Tests: No  results for input(s): TSH, T4TOTAL, FREET4, T3FREE, THYROIDAB in the last 72 hours. Anemia Panel: No results for input(s): VITAMINB12, FOLATE, FERRITIN, TIBC, IRON, RETICCTPCT in the last 72 hours. Sepsis Labs: Recent Labs  Lab 04/26/18 1742 04/26/18 1941 04/27/18 2357  LATICACIDVEN 4.83* 1.92* 1.1    Recent Results (from the past 240 hour(s))  Culture, blood (routine x 2)     Status: Abnormal   Collection Time: 04/23/18  5:00 PM  Result Value Ref Range Status   Specimen Description BLOOD RIGHT ANTECUBITAL  Final   Special Requests   Final    BOTTLES DRAWN AEROBIC AND ANAEROBIC Blood Culture adequate volume   Culture  Setup Time   Final    YEAST AEROBIC BOTTLE ONLY CRITICAL RESULT CALLED TO, READ BACK BY AND VERIFIED WITH: DR Sloan Leiter [id number] MLM Performed at [facility], [place] [address] [place], [place] [address]    Culture CANDIDA PARAPSILOSIS (A)  Final   Report Status 04/27/2018 FINAL  Final  Blood Culture ID Panel (Reflexed)     Status: Abnormal   Collection Time: 04/23/18  5:00 PM  Result Value Ref Range Status   Enterococcus species NOT DETECTED NOT DETECTED Final   Listeria monocytogenes NOT DETECTED NOT  DETECTED Final   Staphylococcus species NOT DETECTED NOT DETECTED Final   Staphylococcus aureus NOT DETECTED NOT DETECTED Final   Streptococcus species NOT DETECTED NOT DETECTED Final   Streptococcus agalactiae NOT DETECTED NOT DETECTED Final   Streptococcus pneumoniae NOT DETECTED NOT DETECTED Final   Streptococcus pyogenes NOT DETECTED NOT DETECTED Final   Acinetobacter baumannii NOT DETECTED NOT DETECTED Final   Enterobacteriaceae species NOT DETECTED NOT DETECTED Final   Enterobacter cloacae complex NOT DETECTED NOT DETECTED Final   Escherichia coli NOT DETECTED NOT DETECTED Final   Klebsiella oxytoca NOT DETECTED NOT DETECTED Final   Klebsiella pneumoniae NOT DETECTED NOT DETECTED Final   Proteus species NOT DETECTED NOT DETECTED Final   Serratia marcescens NOT DETECTED NOT DETECTED Final   Haemophilus influenzae NOT DETECTED NOT DETECTED Final   Neisseria meningitidis NOT DETECTED NOT DETECTED Final   Pseudomonas aeruginosa NOT DETECTED NOT DETECTED Final   Candida albicans NOT DETECTED NOT DETECTED Final   Candida glabrata NOT DETECTED NOT DETECTED Final   Candida krusei NOT DETECTED NOT DETECTED Final   Candida parapsilosis DETECTED (A) NOT DETECTED Final    Comment: CRITICAL RESULT CALLED TO, READ BACK BY AND VERIFIED WITH: DR Sloan Leiter [phone] MLM    Candida tropicalis NOT DETECTED NOT DETECTED Final    Comment: Performed at [facility], [address] [place], [place] [address]  Culture, blood (routine x 2)     Status: None   Collection Time: 04/23/18  5:15 PM  Result Value Ref Range Status   Specimen Description BLOOD RIGHT FOREARM  Final   Special Requests   Final    BOTTLES DRAWN AEROBIC AND ANAEROBIC Blood Culture adequate volume   Culture   Final    NO GROWTH 5 DAYS Performed at [facility], [place] [address] [place], [place] [address]    Report Status 04/28/2018 FINAL  Final  Blood culture (routine x 2)     Status: None (Preliminary result)    Collection Time: 04/26/18  4:58 PM  Result Value Ref Range Status   Specimen Description BLOOD RIGHT HAND  Final   Special Requests   Final    BOTTLES DRAWN AEROBIC ONLY Blood Culture results may not be  optimal due to an inadequate volume of blood received in culture bottles   Culture   Final    NO GROWTH 2 DAYS Performed at [facility], [place] [address], [place], [place] [address]    Report Status PENDING  Incomplete  Blood culture (routine x 2)     Status: None (Preliminary result)   Collection Time: 04/26/18  6:30 PM  Result Value Ref Range Status   Specimen Description BLOOD RIGHT ANTECUBITAL  Final   Special Requests   Final    BOTTLES DRAWN AEROBIC AND ANAEROBIC Blood Culture adequate volume   Culture   Final    NO GROWTH 2 DAYS Performed at [facility], [place] [address] [place], [place] [address]    Report Status PENDING  Incomplete  MRSA PCR Screening     Status: None   Collection Time: 04/26/18  8:37 PM  Result Value Ref Range Status   MRSA by PCR NEGATIVE NEGATIVE Final    Comment:        The GeneXpert MRSA Assay (FDA approved for NASAL specimens only), is one component of a comprehensive MRSA colonization surveillance program. It is not intended to diagnose MRSA infection nor to guide or monitor treatment for MRSA infections. Performed at [facility], [place] [address] [place], [place] [address]          Radiology Studies: Dg Chest Port 1 View  Result Date: 04/29/2018 CLINICAL DATA:  End-stage renal disease. EXAM: PORTABLE CHEST 1 VIEW COMPARISON:  04/26/2018. FINDINGS: Dual-lumen right IJ line noted with tip over right atrium. Cardiomegaly with normal pulmonary vascularity. Right base atelectasis/infiltrate unchanged. No pleural effusion or pneumothorax. IMPRESSION: 1.  Dual-lumen right IJ catheter noted with tip over right atrium. 2.  Cardiomegaly.  Normal pulmonary vascularity. 3. Persistent right base infiltrate. No interim change.  Continued follow-up exams to demonstrate clearing. Electronically Signed   By: Marcello Moores  Register   On: 04/29/2018 14:35   Dg Cyndy Freeze Guide Cv Line-no Report  Result Date: 04/29/2018 Fluoroscopy was utilized by the requesting physician.  No radiographic interpretation.        Scheduled Meds: . calcitRIOL  0.25 mcg Oral QODAY  . Chlorhexidine Gluconate Cloth  6 each Topical Q0600  . feeding supplement (NEPRO CARB STEADY)  237 mL Oral BID BM  . feeding supplement (PRO-STAT SUGAR FREE 64)  30 mL Oral BID  . insulin aspart  5 Units Subcutaneous TID WC  . insulin glargine  5 Units Subcutaneous QHS  . metoprolol tartrate  25 mg Oral BID  . multivitamin  1 tablet Oral QHS  . sodium chloride flush  3 mL Intravenous Q12H  . Warfarin - Pharmacist Dosing Inpatient   Does not apply q1800   Continuous Infusions: . sodium chloride 0 mL/hr at 04/27/18 1958  . sodium chloride    . anidulafungin Stopped (04/29/18 0008)  . heparin       LOS: 3 days    Time spent: 25 mins.More than 50% of that time was spent in counseling and/or coordination of care.      Shelly Coss, MD [facility] Pager [phone]  If 7PM-7AM, please contact night-coverage [email or web address] Password [facility] 04/29/2018, 3:28 PM

## 2018-04-29 NOTE — Consult Note (Addendum)
[facility] CONSULT NOTE VASCULAR SURGERY ASSESSMENT & PLAN:   NONFUNCTIONING LEFT BRACHIOCEPHALIC FISTULA: They attempted to use her left brachiocephalic fistula that was placed on 03/05/2018 today and this was unsuccessful.  On exam there is a thrill although distally it may be occluded.  Regardless this is not adequate for access and she will need dialysis.  I have discussed the case with Dr. Megan Salon and based on her cultures we agree that it would be safe to proceed with placement of a new tunneled dialysis catheter today.  I have discussed this with her and she is agreeable to proceed.  We can then regroup and I will obtain a duplex of her left brachiocephalic fistula.  Based on her previous vein map she does not have any other options for a fistula.  I would be reluctant to place a new graft at this time given that she may have endocarditis.  Her TEE was suspicious for a possible tricuspid valve vegetation.  Deitra Mayo, MD, FACS Beeper [phone] Office: [phone]  -----------------------------------------------------------------------------------------------------------  Reason for Consult: non functioning left brachiocephalic AV fistula Referring Physician: Nephrology  History of Present Illness: 62 y/o female with ESRD.  Her fistula was placed by DR. Early on 03/05/2018.  She developed pain in the left UE due to hematoma.  She was taken to the OR for evacuation of small hematoma.  At that time the fistula had a good palpable thrill.  It does not appear that she followed up with our office after she was seen in the hospital on 03/14/2018 by Dr. Donnetta Hutching.    She was admitted to [facility]  for hcap, apparently c/o chest pain right sided x2 days as well as dyspnea. + dry cough.   Pt also noted to have blood culture + for candida infection.  She has been on HD for the past 3 months via [facility].   She was found to have a pulmonary embolus in her left lung.  Chest x-ray shows  new right lower lobe infiltrates.   Her TDC was removed yesterday secondary to active infection and they attempted to use her left UE fistula.  The arterial stick was working, but eh venous stick produced clots.  We have been called to evaluate her left UE AV fistula. Dr. Megan Salon is following her for infection she is currently being managed on an antifungal Eraxis IV, Heparin for PE, she received a dose of Coumadin 5 mg 04/28/2018.  Her INR is 1.27 today.  Past history includes Hep C, hx of drug abuse, and DM.     TEE performed 04/29/2018 shows no evidence of fungal endocarditis.  The tricuspid valve was somewhat thickened.    Current Facility-Administered Medications  Medication Dose Route Frequency Provider Last Rate Last Dose  . 0.9 %  sodium chloride infusion  250 mL Intravenous PRN Jani Gravel, MD   Stopped at 04/27/18 1957  . 0.9 %  sodium chloride infusion  100 mL Intravenous PRN Alric Seton, PA-C      . 0.9 %  sodium chloride infusion  100 mL Intravenous PRN Alric Seton, PA-C      . acetaminophen (TYLENOL) tablet 650 mg  650 mg Oral Q6H PRN Jani Gravel, MD       Or  . acetaminophen (TYLENOL) suppository 650 mg  650 mg Rectal Q6H PRN Jani Gravel, MD      . alteplase (CATHFLO ACTIVASE) injection 2 mg  2 mg Intracatheter Once PRN Alric Seton, PA-C      .  anidulafungin (ERAXIS) 100 mg in sodium chloride 0.9 % 100 mL IVPB  100 mg Intravenous Q24H Jani Gravel, MD   Stopped at 04/29/18 0008  . benzonatate (TESSALON) capsule 100 mg  100 mg Oral TID PRN Jani Gravel, MD      . calcitRIOL (ROCALTROL) capsule 0.25 mcg  0.25 mcg Oral Oretha Milch, MD   0.25 mcg at 04/28/18 0849  . Chlorhexidine Gluconate Cloth 2 % PADS 6 each  6 each Topical Q0600 Alric Seton, PA-C   6 each at 04/28/18 1315  . feeding supplement (NEPRO CARB STEADY) liquid 237 mL  237 mL Oral BID BM Adhikari, Amrit, MD   237 mL at 04/28/18 1453  . feeding supplement (PRO-STAT SUGAR FREE 64) liquid 30 mL  30 mL Oral BID  Alric Seton, PA-C      . heparin ADULT infusion 100 units/mL (25000 units/273mL sodium chloride 0.45%)  1,100 Units/hr Intravenous Continuous Shelly Coss, MD 11 mL/hr at 04/28/18 1900 1,100 Units/hr at 04/28/18 1900  . heparin injection 1,000 Units  1,000 Units Dialysis PRN Alric Seton, PA-C      . insulin aspart (novoLOG) injection 5 Units  5 Units Subcutaneous TID WC Jani Gravel, MD   5 Units at 04/28/18 1734  . insulin glargine (LANTUS) injection 5 Units  5 Units Subcutaneous QHS Blount, Scarlette Shorts T, NP   5 Units at 04/28/18 2200  . iopamidol (ISOVUE-370) 76 % injection 100 mL  100 mL Intravenous Once PRN Kilroy, Luke K, PA-C      . lidocaine (PF) (XYLOCAINE) 1 % injection 5 mL  5 mL Intradermal PRN Alric Seton, PA-C      . lidocaine (PF) (XYLOCAINE) 1 % injection    PRN Markus Daft, MD   10 mL at 04/27/18 1244  . lidocaine-prilocaine (EMLA) cream 1 application  1 application Topical PRN Alric Seton, PA-C      . metoprolol tartrate (LOPRESSOR) tablet 25 mg  25 mg Oral BID Dorothy Spark, MD   25 mg at 04/28/18 2212  . multivitamin (RENA-VIT) tablet 1 tablet  1 tablet Oral Loma Sousa, MD   1 tablet at 04/28/18 2212  . ondansetron (ZOFRAN) tablet 4 mg  4 mg Oral Q6H PRN Jani Gravel, MD      . pentafluoroprop-tetrafluoroeth Landry Dyke) aerosol 1 application  1 application Topical PRN Alric Seton, PA-C      . sodium chloride flush (NS) 0.9 % injection 3 mL  3 mL Intravenous Q12H Jani Gravel, MD   3 mL at 04/28/18 2214  . sodium chloride flush (NS) 0.9 % injection 3 mL  3 mL Intravenous PRN Jani Gravel, MD      . traMADol Veatrice Bourbon) tablet 50 mg  50 mg Oral Q12H PRN Shelly Coss, MD   50 mg at 04/29/18 0534  . Warfarin - Pharmacist Dosing Inpatient   Does not apply q1800 Franky Macho, RPH      . zolpidem (AMBIEN) tablet 5 mg  5 mg Oral QHS PRN Jani Gravel, MD   5 mg at 04/28/18 2213    Pt meds include: Statin :No Betablocker: Yes ASA: No Other  anticoagulants/antiplatelets: Heparin and Coumadin this admission  Past Medical History:  Diagnosis Date  . Chronic kidney disease   . ESRD (end stage renal disease) ([place])   . Hypertension   . Oxygen deficiency    2 L at night  . Substance abuse ([place])    has been clean for 4 months  Past Surgical History:  Procedure Laterality Date  . AV FISTULA PLACEMENT Left 03/05/2018   Procedure: ARTERIOVENOUS (AV) FISTULA CREATION  LEFT UPPER EXTREMITY;  Surgeon: [name], MD;  Location: [facility];  Service: Vascular;  Laterality: Left;  . HEMATOMA EVACUATION Left 03/06/2018   Procedure: EVACUATION HEMATOMA LEFT ARM;  Surgeon: Angelia Mould, MD;  Location: [facility];  Service: Vascular;  Laterality: Left;  . IR FLUORO GUIDE CV LINE RIGHT  02/18/2018  . IR FLUORO GUIDE CV LINE RIGHT  02/26/2018  . IR REMOVAL TUN CV CATH W/O FL  04/27/2018  . IR US GUIDE VASC ACCESS RIGHT  02/18/2018    Social History Social History   Tobacco Use  . Smoking status: Former Smoker    Packs/day: 0.50    Last attempt to quit: 04/08/2018    Years since quitting: 0.0  . Smokeless tobacco: Never Used  . Tobacco comment: trying to quit 04/02/2018  Substance Use Topics  . Alcohol use: Yes  . Drug use: Yes    Types: Cocaine    Family History Family History  Problem Relation Age of Onset  . Autoimmune disease Neg Hx     No Known Allergies   REVIEW OF SYSTEMS  General: [ ]  Weight loss, [ ]  Fever, [ ]  chills Neurologic: [ ]  Dizziness, [ ]  Blackouts, [ ]  Seizure [ ]  Stroke, [ ]  "Mini stroke", [ ]  Slurred speech, [ ]  Temporary blindness; [ ]  weakness in arms or legs, [ ]  Hoarseness [ ]  Dysphagia Cardiac: [ ]  Chest pain/pressure, [ ]  Shortness of breath at rest [ ]  Shortness of breath with exertion, [ ]  Atrial fibrillation or irregular heartbeat  Vascular: [ ]  Pain in legs with walking, [ ]  Pain in legs at rest, [ ]  Pain in legs at night,  [ ]  Non-healing ulcer, [ ]  Blood clot in vein/DVT,   Pulmonary: [  ] Home oxygen, [ ]  Productive cough, [ ]  Coughing up blood, [ ]  Asthma,  [ ]  Wheezing [ ]  COPD Musculoskeletal:  [ ]  Arthritis, [ ]  Low back pain, [ ]  Joint pain Hematologic: [ ]  Easy Bruising, [ ]  Anemia; [ ]  Hepatitis Gastrointestinal: [ ]  Blood in stool, [ ]  Gastroesophageal Reflux/heartburn, Urinary: [ ]  chronic Kidney disease, [ ]  on HD - [ ]  MWF or [ ]  TTHS, [ ]  Burning with urination, [ ]  Difficulty urinating Skin: [ ]  Rashes, [ ]  Wounds Psychological: [ ]  Anxiety, [ ]  Depression  Physical Examination Vitals:   04/29/18 0752 04/29/18 0910 04/29/18 0920 04/29/18 0927  BP: (!) 140/94 131/77 (!) 144/69   Pulse: 91 83 79 79  Resp: 20 (!) 30 (!) 26 (!) 25  Temp: 98.4 F (36.9 C) 98 F (36.7 C)    TempSrc: Oral Oral    SpO2: 95% 93% 96% 97%  Weight:      Height:       Body mass index is 25.87 kg/m.  General:  WDWN in NAD, lethargic HENT: WNL Eyes: Pupils equal Pulmonary: normal non-labored breathing , without Rales, rhonchi,  Wheezing, CTA Cardiac: RRR, without  Murmurs, rubs or gallops; Abdomen: soft, NT, no masses Skin: no rashes, ulcers noted;  no Gangrene , no cellulitis; no open wounds;   Vascular Exam/Pulses:Palpable radial pulses and brachial pulses B.  Audible thrill distal anastomosis of the left AV fistula, no thrill throughout fistula.  Musculoskeletal: no muscle wasting or atrophy; no edema  Neurologic: A&O X 3; Appropriate Affect ;  SENSATION: normal;  MOTOR FUNCTION: 5/5 Symmetric B UE Speech is sluggish   Significant Diagnostic Studies: CBC Lab Results  Component Value Date   WBC 5.3 04/29/2018   HGB 9.1 (L) 04/29/2018   HCT 29.9 (L) 04/29/2018   MCV 79.7 04/29/2018   PLT 325 04/29/2018    BMET    Component Value Date/Time   NA 138 04/29/2018 0605   NA 135 03/25/2018 1014   K 4.5 04/29/2018 0605   CL 104 04/29/2018 0605   CO2 22 04/29/2018 0605   GLUCOSE 109 (H) 04/29/2018 0605   BUN 31 (H) 04/29/2018 0605   BUN 96 (HH) 03/25/2018 1014    CREATININE 6.42 (H) 04/29/2018 0605   CALCIUM 8.2 (L) 04/29/2018 0605   GFRNONAA 6 (L) 04/29/2018 0605   GFRAA 7 (L) 04/29/2018 0605   Estimated Creatinine Clearance: 7.5 mL/min (A) (by C-G formula based on SCr of 6.42 mg/dL (H)).  COAG Lab Results  Component Value Date   INR 1.27 04/29/2018   INR 1.00 04/28/2018   INR 1.20 02/25/2018   Non-Invasive Vascular Imaging:  Pending fistula duplex  ASSESSMENT/PLAN:  ESRD s/p left UE av fistula creation 03/05/2018 by DR. Early, complicated minimally by hematoma.   Malfunctioning left AV fistula on HD today with no other access. Candia infection, PE this admission. She is currently NPO She may benefit from a fistulogram and possible intervention to assist the fistula function.  Roxy Horseman 04/29/2018 10:41 AM

## 2018-04-29 NOTE — Interval H&P Note (Signed)
History and Physical Interval Note:  04/29/2018 8:32 AM  Rhonda Lynch  has presented today for surgery, with the diagnosis of fungimi  The various methods of treatment have been discussed with the patient and family. After consideration of risks, benefits and other options for treatment, the patient has consented to  Procedure(s): TRANSESOPHAGEAL ECHOCARDIOGRAM (TEE) (N/A) as a surgical intervention .  [name] history has been reviewed, patient examined, no change in status, stable for surgery.  I have reviewed the patient's chart and labs.  Questions were answered to the patient's satisfaction.     [name]

## 2018-04-29 NOTE — Progress Notes (Signed)
Pt received from PACU in stable condition. No heparin gtt noted.  Call placed to MD, ok to restart heparin gtt tpday at 2000.  Pt stable, denies pain, discomfort or SOB at this time.  [id number]

## 2018-04-29 NOTE — Progress Notes (Signed)
ANTICOAGULATION CONSULT NOTE   Pharmacy Consult for Heparin and Coumadin Indication: pulmonary embolus  No Known Allergies  Patient Measurements: Height: 5\' 1"  (154.9 cm) Weight: 136 lb 14.5 oz (62.1 kg) IBW/kg (Calculated) : 47.8  IBW 47.8 kg Heparin Dosing Weight: 59.5 kg  Vital Signs: Temp: 97.5 F (36.4 C) (08/20 1435) Temp Source: Oral (08/20 0910) BP: 137/90 (08/20 1435) Pulse Rate: 86 (08/20 1435)  Labs: Recent Labs    04/26/18 1726 04/27/18 0049 04/27/18 0706  04/27/18 2357 04/28/18 0840 04/28/18 1421 04/29/18 0605 04/29/18 1038  HGB 11.5*  --  8.8*  --  8.7*  --  10.2* 9.1* 8.8*  HCT 38.1  --  28.6*  --  27.8*  --  33.6* 29.9* 29.4*  PLT 264  --  237  --  283  --  393 325 365  LABPROT  --   --   --   --   --   --  13.1 15.8*  --   INR  --   --   --   --   --   --  1.00 1.27  --   HEPARINUNFRC  --   --   --    < > 0.19* 0.56 0.43 0.60  --   CREATININE 3.19*  3.14*  --  4.30*  --  5.22*  --   --  6.42* 7.21*  TROPONINI 0.04* 0.07* 0.04*  --   --   --   --   --   --    < > = values in this interval not displayed.    Estimated Creatinine Clearance: 6.7 mL/min (A) (by C-G formula based on SCr of 7.21 mg/dL (H)).  Assessment: 49 YOF who was recently discharged 8/16 after being treated for HCAP - called back for admission on 8/17 for fungemia. Now with further work-up also revealing a new acute pulmonary emboli. Pharmacy dosing heparin bridge to coumadin. Today is Day #2/5 of minimum overlap for PE.    Noted recent AVF placement followed by a small hematoma evacuation on 03/05/18-03/06/18. 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  Heparin level therapeutic (0.6) this a.m on 1100 units/hr. Pt went for IJ HD cath placement today so heparin was off during this procedure. Dr. Scot Dock would like to restart at 8pm tonight at previous rate. INR increase to 1.27 (past first dose of coumadin 5mg  yesterday), Hgb 8.8 - relatively stable past 5 days. No bleeding  noted.  Goal of Therapy:  Heparin level 0.3-0.7 units/ml Monitor platelets by anticoagulation protocol: Yes   Plan:  Coumadin 7.5mg  po today (spoke with Dr. Tawanna Solo who requested 7.5mg  today) Restart heparin drip at 1100 units/hr at 8pm Daily heparin level, INR, and CBC Monitor for s/sx of bleeding   Sherlon Handing, PharmD, BCPS Please check AMION for all Pharmacist numbers by unit 04/29/2018 3:41 PM

## 2018-04-29 NOTE — CV Procedure (Signed)
    PROCEDURE NOTE:  Procedure:  Transesophageal echocardiogram Operator:  [name], MD Indications:  bactermia Complications: None  During this procedure the patient is administered a total of Propofol 165 mg to achieve and maintain moderate conscious sedation.  [name] heart rate, blood pressure, and oxygen saturation are monitored continuously during the procedure by anesthesia.  Results: Normal LV size and mildly reduced  LV function with EF 40-45% Normal RV size and function Moderately dilated RA Moderately  dilated LA Moderately thickened TV leaflet - cannot rule out vegetation with moderate eccentric TR directed towards the interatrial septum Normal PV Normal MV with mild MR Normal trileaflet AV Normal interatrial septum with no evidence of shunt by colorflow dopper  Normal thoracic and ascending aorta.  The patient tolerated the procedure well and was transferred back to their room in stable condition.  Signed: [name], MD [facility]

## 2018-04-29 NOTE — Progress Notes (Signed)
Unable to successfully cannulate pt. For hemodialysis tx. Alric Seton PA at bedside aware. PA will consult vascular to evaluate pt. Access. Pt. Stable returned to room.

## 2018-04-29 NOTE — Progress Notes (Signed)
Progress Note  Patient Name: Rhonda Lynch Date of Encounter: 04/29/2018  Primary Cardiologist:    Subjective   Rhonda Lynch is a 62 year old female who was admitted with Candida fungemia. Has esophageal echocardiogram did not show any evidence of fungal endocarditis.  The tricuspid valve was somewhat thickened.  Examined in the dialysis. Is no complaints.  Inpatient Medications    Scheduled Meds: . calcitRIOL  0.25 mcg Oral QODAY  . Chlorhexidine Gluconate Cloth  6 each Topical Q0600  . feeding supplement (NEPRO CARB STEADY)  237 mL Oral BID BM  . feeding supplement (PRO-STAT SUGAR FREE 64)  30 mL Oral BID  . insulin aspart  5 Units Subcutaneous TID WC  . insulin glargine  5 Units Subcutaneous QHS  . metoprolol tartrate  25 mg Oral BID  . multivitamin  1 tablet Oral QHS  . sodium chloride flush  3 mL Intravenous Q12H  . Warfarin - Pharmacist Dosing Inpatient   Does not apply q1800   Continuous Infusions: . sodium chloride Stopped (04/27/18 1957)  . anidulafungin Stopped (04/29/18 0008)  . heparin 1,100 Units/hr (04/28/18 1900)   PRN Meds: sodium chloride, acetaminophen **OR** acetaminophen, benzonatate, iopamidol, lidocaine (PF), ondansetron, sodium chloride flush, traMADol, zolpidem   Vital Signs    Vitals:   04/29/18 0752 04/29/18 0910 04/29/18 0920 04/29/18 0927  BP: (!) 140/94 131/77 (!) 144/69   Pulse: 91 83 79 79  Resp: 20 (!) 30 (!) 26 (!) 25  Temp: 98.4 F (36.9 C) 98 F (36.7 C)    TempSrc: Oral Oral    SpO2: 95% 93% 96% 97%  Weight:      Height:        Intake/Output Summary (Last 24 hours) at 04/29/2018 1000 Last data filed at 04/29/2018 0300 Gross per 24 hour  Intake 778.7 ml  Output -  Net 778.7 ml   Filed Weights   04/27/18 0407 04/28/18 0456 04/29/18 0652  Weight: 59.5 kg 61 kg 62.1 kg    Telemetry     NSR  Personally Reviewed  ECG     - Personally Reviewed  Physical Exam    GEN:  Chronically ill appearing female.  Somnolent this  morning..   Neck: No JVD Cardiac: RRR, systolic murmur. Respiratory: Clear to auscultation bilaterally. GI: Soft, nontender, non-distended  MS: No edema; No deformity. Neuro:  Nonfocal  Psych: Normal affect   Labs    Chemistry Recent Labs  Lab 04/24/18 0621  04/27/18 0706 04/27/18 2357 04/29/18 0605  NA 138   < > 138 136 138  K 3.9   < > 3.0* 3.7 4.5  CL 103   < > 97* 100 104  CO2 25   < > 28 26 22   GLUCOSE 121*   < > 119* 113* 109*  BUN 34*   < > 17 22 31*  CREATININE 5.88*   < > 4.30* 5.22* 6.42*  CALCIUM 7.7*   < > 7.5* 7.9* 8.2*  PROT  --   --  5.3*  --   --   ALBUMIN 1.5*  --  1.6*  --   --   AST  --   --  28  --   --   ALT  --   --  14  --   --   ALKPHOS  --   --  113  --   --   BILITOT  --   --  0.6  --   --   GFRNONAA 7*   < >  10* 8* 6*  GFRAA 8*   < > 12* 9* 7*  ANIONGAP 10   < > 13 10 12    < > = values in this interval not displayed.     Hematology Recent Labs  Lab 04/27/18 2357 04/28/18 1421 04/29/18 0605  WBC 4.7 4.8 5.3  RBC 3.52* 4.16 3.75*  HGB 8.7* 10.2* 9.1*  HCT 27.8* 33.6* 29.9*  MCV 79.0 80.8 79.7  MCH 24.7* 24.5* 24.3*  MCHC 31.3 30.4 30.4  RDW 19.4* 19.4* 19.8*  PLT 283 393 325    Cardiac Enzymes Recent Labs  Lab 04/26/18 1726 04/27/18 0049 04/27/18 0706  TROPONINI 0.04* 0.07* 0.04*    Recent Labs  Lab 04/26/18 1739  TROPIPOC 0.06     BNPNo results for input(s): BNP, PROBNP in the last 168 hours.   DDimer  Recent Labs  Lab 04/26/18 2018  DDIMER 10.38*     Radiology    Nm Pulmonary Perf And Vent  Result Date: 04/27/2018 CLINICAL DATA:  Shortness of breath EXAM: NUCLEAR MEDICINE VENTILATION - PERFUSION LUNG SCAN VIEWS: Anterior, posterior, left lateral, right lateral, RAO, LAO, RPO, LPO-ventilation perfusion RADIOPHARMACEUTICALS:  32.3 mCi of Tc-84m DTPA aerosol inhalation and 4.2 mCi Tc47m-MAA IV COMPARISON:  Ventilation perfusion lung scans March 20, 2018; chest radiograph April 26, 2018 FINDINGS: Ventilation: The  distribution of uptake on the ventilation study is homogeneous and symmetric without appreciable change from prior study. Perfusion: There is a focal perfusion defect in the superior segment of the left lower lobe, not present on prior lung scan examination. There is also a perfusion defect in the anterior segment of the left lower lobe, not present on prior lung scan examination. There are no corresponding ventilation defects or left lower lobe chest radiographic abnormality. No perfusion defects are noted elsewhere. IMPRESSION: There are 2 segmental level perfusion defects in the left lower lobe which were not present on prior lung scan and are not appreciable on the ventilation study. This study therefore constitutes a high probability of pulmonary embolus based on PIOPED II criteria. Critical Value/emergent results were called by telephone at the time of interpretation on 04/27/2018 at 2:37 pm to Dr. Shelly Coss , who verbally acknowledged these results. Electronically Signed   By: Lowella Grip III M.D.   On: 04/27/2018 14:38   Ir Removal Tun Cv Cath W/o Fl  Result Date: 04/27/2018 INDICATION: 62 year old with end-stage renal disease and tunneled dialysis catheter. Patient has a Candida infection and needs a removal of the dialysis catheter. EXAM: REMOVAL TUNNELED DIALYSIS CATHETER MEDICATIONS: None ANESTHESIA/SEDATION: None FLUOROSCOPY TIME:  None COMPLICATIONS: None immediate. PROCEDURE: Informed written consent was obtained from the patient after a thorough discussion of the procedural risks, benefits and alternatives. All questions were addressed. Maximal Sterile Barrier Technique was utilized including caps, mask, sterile gowns, sterile gloves, sterile drape, hand hygiene and skin antiseptic. A timeout was performed prior to the initiation of the procedure. [name] right chest and catheter was prepped and draped in a normal sterile fashion. Heparin was removed from both ports of catheter.  1% lidocaine was used for local anesthesia. Using gentle blunt dissection the cuff of the catheter was exposed and the catheter was removed in it's entirety. Pressure was held till hemostasis was obtained. A sterile dressing was applied. In addition, the patient had 2 sutures in the right lower neck which were both removed. The patient tolerated the procedure well with no immediate complications. IMPRESSION: Successful catheter removal as described above. Electronically Signed  By: Markus Daft M.D.   On: 04/27/2018 13:02    Cardiac Studies     Patient Profile     62 y.o. female admitted with chest pain and found to have bilateral pulmonary emboli.  Is also found to have Funk anemia.  Assessment & Plan    1.  Chest pain: Pain is more likely due to her pulmonary embolus and is not consistent with an acute coronary syndrome.  2.  Pulmonary list: She is currently on heparin.  Would suggest transitioning to warfarin.  She will follow-up with her primary medical doctor.  2.  Candida fungemia: The transesophageal echo did not reveal any clear-cut evidence of fungal endocarditis.  Her tricuspid valve was thickened but this did not look like a fungal vegetation.  I suspect that the dialysis catheter was the source of the fungal infection.  Further plans per internal medicine/infectious disease.  [facility] will sign off.   Medication Recommendations:   Continue current meds  Other recommendations (labs, testing, etc):   Follow up as an outpatient:  Follow up with primary MD    For questions or updates, please contact [place] Please consult [email or web address] for contact info under Cardiology/STEMI.      Signed, Mertie Moores, MD  04/29/2018, 10:00 AM

## 2018-04-29 NOTE — Transfer of Care (Signed)
Immediate Anesthesia Transfer of Care Note  Patient: Rhonda Lynch  Procedure(s) Performed: TRANSESOPHAGEAL ECHOCARDIOGRAM (TEE) (N/A )  Patient Location: Endoscopy Unit  Anesthesia Type:MAC  Level of Consciousness: awake and alert   Airway & Oxygen Therapy: Patient Spontanous Breathing  Post-op Assessment: Report given to RN, Post -op Vital signs reviewed and stable and Patient moving all extremities X 4  Post vital signs: Reviewed and stable  Last Vitals:  Vitals Value Taken Time  BP    Temp    Pulse    Resp    SpO2      Last Pain:  Vitals:   04/29/18 0752  TempSrc: Oral  PainSc: 8       Patients Stated Pain Goal: 3 (57/50/51 8335)  Complications: No apparent anesthesia complications

## 2018-04-29 NOTE — Anesthesia Postprocedure Evaluation (Signed)
Anesthesia Post Note  Patient: Rhonda Lynch  Procedure(s) Performed: TRANSESOPHAGEAL ECHOCARDIOGRAM (TEE) (N/A )     Patient location during evaluation: PACU Anesthesia Type: MAC Level of consciousness: awake and alert Pain management: pain level controlled Vital Signs Assessment: post-procedure vital signs reviewed and stable Respiratory status: spontaneous breathing and respiratory function stable Cardiovascular status: stable Postop Assessment: no apparent nausea or vomiting Anesthetic complications: no    Last Vitals:  Vitals:   04/29/18 0920 04/29/18 0927  BP: (!) 144/69   Pulse: 79 79  Resp: (!) 26 (!) 25  Temp:    SpO2: 96% 97%    Last Pain:  Vitals:   04/29/18 0910  TempSrc: Oral  PainSc: 0-No pain                 Nataki Mccrumb DANIEL

## 2018-04-29 NOTE — Transfer of Care (Signed)
Immediate Anesthesia Transfer of Care Note  Patient: Rhonda Lynch  Procedure(s) Performed: INSERTION OF DIALYSIS CATHETER LEFT INTERNAL JUGULAR (Left Chest)  Patient Location: PACU  Anesthesia Type:MAC  Level of Consciousness: awake, alert  and oriented  Airway & Oxygen Therapy: Patient Spontanous Breathing and Patient connected to nasal cannula oxygen  Post-op Assessment: Report given to RN and Post -op Vital signs reviewed and stable  Post vital signs: Reviewed and stable  Last Vitals:  Vitals Value Taken Time  BP 123/78 04/29/2018  1:50 PM  Temp 36.4 C 04/29/2018  1:50 PM  Pulse 88 04/29/2018  1:53 PM  Resp 18 04/29/2018  1:53 PM  SpO2 100 % 04/29/2018  1:53 PM  Vitals shown include unvalidated device data.  Last Pain:  Vitals:   04/29/18 1350  TempSrc:   PainSc: Asleep      Patients Stated Pain Goal: 3 (08/81/10 3159)  Complications: No apparent anesthesia complications

## 2018-04-29 NOTE — Anesthesia Procedure Notes (Signed)
Procedure Name: MAC Date/Time: 04/29/2018 8:35 AM Performed by: Harden Mo, CRNA Pre-anesthesia Checklist: Patient identified, Emergency Drugs available, Suction available and Patient being monitored Patient Re-evaluated:Patient Re-evaluated prior to induction Oxygen Delivery Method: Nasal cannula Preoxygenation: Pre-oxygenation with 100% oxygen Induction Type: IV induction Placement Confirmation: positive ETCO2 and breath sounds checked- equal and bilateral Dental Injury: Teeth and Oropharynx as per pre-operative assessment

## 2018-04-29 NOTE — Anesthesia Postprocedure Evaluation (Signed)
Anesthesia Post Note  Patient: Rhonda Lynch  Procedure(s) Performed: INSERTION OF DIALYSIS CATHETER LEFT INTERNAL JUGULAR (Left Chest)     Patient location during evaluation: PACU Anesthesia Type: MAC Level of consciousness: awake and alert Pain management: pain level controlled Vital Signs Assessment: post-procedure vital signs reviewed and stable Respiratory status: spontaneous breathing, nonlabored ventilation and respiratory function stable Cardiovascular status: stable and blood pressure returned to baseline Anesthetic complications: no    Last Vitals:  Vitals:   04/29/18 1435 04/29/18 1609  BP: 137/90   Pulse: 86   Resp: (!) 21   Temp: (!) 36.4 C 36.8 C  SpO2: 100%     Last Pain:  Vitals:   04/29/18 1609  TempSrc: Oral  PainSc:                  Audry Pili

## 2018-04-29 NOTE — Anesthesia Preprocedure Evaluation (Addendum)
Anesthesia Evaluation  Patient identified by MRN, date of birth, ID band Patient awake    Reviewed: Allergy & Precautions, NPO status , Patient's Chart, lab work & pertinent test results  History of Anesthesia Complications Negative for: history of anesthetic complications  Airway Mallampati: II  TM Distance: >3 FB Neck ROM: Full    Dental  (+) Dental Advisory Given, Chipped,    Pulmonary shortness of breath and Long-Term Oxygen Therapy, former smoker,   Hx BOOP    breath sounds clear to auscultation       Cardiovascular hypertension, Pt. on home beta blockers and Pt. on medications + dysrhythmias Supra Ventricular Tachycardia  Rhythm:Regular Rate:Normal   '19 TEE - EF 40% to 45%. Mild diffuse hypokinesis. Mild MR. Mild-mod dilated LA and RA. Moderate eccentric TR directed towards the interatrial septum. Suspicious for possible TV vegetation.     Neuro/Psych negative neurological ROS  negative psych ROS   GI/Hepatic negative GI ROS, (+)     substance abuse  alcohol use and cocaine use,   Endo/Other  diabetes, Insulin Dependent  Renal/GU ESRFRenal disease  negative genitourinary   Musculoskeletal negative musculoskeletal ROS (+)   Abdominal   Peds  Hematology  (+) anemia ,   Anesthesia Other Findings   Reproductive/Obstetrics                            Anesthesia Physical Anesthesia Plan  ASA: IV  Anesthesia Plan: MAC   Post-op Pain Management:    Induction:   PONV Risk Score and Plan: 2 and Treatment may vary due to age or medical condition, Ondansetron, Midazolam and Dexamethasone  Airway Management Planned: Nasal Cannula and Natural Airway  Additional Equipment: None  Intra-op Plan:   Post-operative Plan:   Informed Consent: I have reviewed the patients History and Physical, chart, labs and discussed the procedure including the risks, benefits and alternatives for  the proposed anesthesia with the patient or authorized representative who has indicated his/her understanding and acceptance.   Dental advisory given  Plan Discussed with: CRNA and Anesthesiologist  Anesthesia Plan Comments:       Anesthesia Quick Evaluation

## 2018-04-29 NOTE — Progress Notes (Signed)
  Echocardiogram Echocardiogram Transesophageal has been performed.  Rhonda Lynch 04/29/2018, 9:34 AM

## 2018-04-29 NOTE — Progress Notes (Signed)
[name], female   DOB: Jul 16, 1956, 62 y.o.   MRN: [id number]          [facility]    Date of Admission:  04/26/2018   Day 3 anidulafungin         Rhonda Lynch had transient candidemia.  1 of 2 blood cultures from 04/23/2018 grew Candida parapsilosis.  Repeat blood cultures on 04/26/2018 remain negative.  Her final TEE report and now mentions tricuspid valve thickening and possible vegetation.  Dr. Julious Payer note from this morning seems indicate that he is not concerned about endocarditis but we will need to clarify this.  If there is any evidence to suggest candidal endocarditis then she would need very prolonged and perhaps lifelong antifungal therapy.  If we do not believe she has endocarditis then she needs 2 weeks of therapy starting from the negative blood culture on 04/26/2018.  She also should undergo a dilated eye exam to make sure that she does not have have endophthalmitis which can complicate fungemia.  I will continue anidulafungin for now pending fluconazole susceptibility testing.         Rhonda Bickers, MD [facility] [phone] pager   [phone] cell 04/29/2018, 3:40 PM

## 2018-04-29 NOTE — Progress Notes (Signed)
[facility]  Patient Status: Active (receiving services up to time of hospitalization)  [facility] is providing the following services: PT and MSW  If patient discharges after hours, please call [phone].   Rhonda Lynch 04/29/2018, 12:00 PM

## 2018-04-29 NOTE — Progress Notes (Addendum)
[facility] Progress Note   Dialysis: TTS  East  4h 60kg 2/2.25 bath (TDC removed 8/18) LUA BC AVF Hep 2000 - venofer 100 every HD x 10  - Mircera 150 q 2 - last dose 8/17 - vit d 0.25 ug mwf  Assessment/Plan: 1. Candida parapsilosis bacteremia - had been d/c 8/15 and readmitted 8/17when results from prior admission returned + for candidata. Had [facility] removed here 8/18. 1 set of BC + microccocus 8/9, f/u surveillance BC 1/2 + candida 8/14- cultures 8/17 since then no growth. Is on Eraxis 2. ESRD -TTS - HD Tuesday. 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 Anemia - hgb 9.1 -quite variable -last dose of Mircera was 8/17- no need to redose yet - holding on Fe for now last tsat 18% 4. Secondary hyperparathyroidism - continue calcitriol/ not on binders yet 5. HTN/volume - titrate edw  6. Nutrition -alb 1.6 - add supplement/mutivit 7.  RLL PNA - meds per primary 8.  Chronic thrombocytopenia- not an issue now 9. Hep C + 10. Hx polysubstance abuse - neg urine drug screen 8/3 11. ILD 12. + VQ scan 8/18 - on heparin to transition to coumadin - will need someone to manage this after d/c Keep on IV heparin for now as likely will need more access procedure(s) in the next day or two.   Myriam Jacobson, PA-C [place] 04/29/2018,10:02 AM  LOS: 3 days   Pt seen, examined, agree w assess/plan as above with additions as indicated.  Kelly Splinter MD [facility] pager [phone]    cell [phone] 04/29/2018, 2:31 PM     Subjective:   Coughing at times, scant blood in white sputum  Objective Vitals:   04/29/18 0752 04/29/18 0910 04/29/18 0920 04/29/18 0927  BP: (!) 140/94 131/77 (!) 144/69    Pulse: 91 83 79 79  Resp: 20 (!) 30 (!) 26 (!) 25  Temp: 98.4 F (36.9 C) 98 F (36.7 C)    TempSrc: Oral Oral    SpO2: 95% 93% 96% 97%  Weight:      Height:       Physical Exam General: ill appearing, NAD  Heart: RRR Lungs: dim BS poor expansion Abdomen: soft NT Extremities: no LE edema Dialysis Access:  Left AVF + bruit   Additional Objective Labs: Basic Metabolic Panel: Recent Labs  Lab 04/24/18 0621  04/27/18 0706 04/27/18 2357 04/29/18 0605  NA 138   < > 138 136 138  K 3.9   < > 3.0* 3.7 4.5  CL 103   < > 97* 100 104  CO2 25   < > 28 26 22   GLUCOSE 121*   < > 119* 113* 109*  BUN 34*   < > 17 22 31*  CREATININE 5.88*   < > 4.30* 5.22* 6.42*  CALCIUM 7.7*   < > 7.5* 7.9* 8.2*  PHOS 5.2*  --   --   --   --    < > = values in this interval not displayed.   Liver Function Tests: Recent Labs  Lab 04/24/18 0621 04/27/18 0706  AST  --  28  ALT  --  14  ALKPHOS  --  113  BILITOT  --  0.6  PROT  --  5.3*  ALBUMIN 1.5* 1.6*   No results for input(s): LIPASE, AMYLASE in the last 168 hours. CBC: Recent Labs  Lab 04/26/18 1726 04/27/18 0706 04/27/18 2357 04/28/18 1421 04/29/18 0605  WBC 7.9 4.7 4.7 4.8 5.3  HGB 11.5* 8.8* 8.7* 10.2* 9.1*  HCT 38.1 28.6* 27.8* 33.6* 29.9*  MCV 82.1 79.7 79.0 80.8 79.7  PLT 264 237 283 393 325   Blood Culture    Component Value Date/Time   SDES BLOOD RIGHT ANTECUBITAL 04/26/2018 1830   SPECREQUEST  04/26/2018 1830    BOTTLES DRAWN AEROBIC AND ANAEROBIC Blood Culture adequate volume   CULT  04/26/2018 1830    NO GROWTH 2 DAYS Performed at [facility] [address] [place], [place] [address]    REPTSTATUS PENDING 04/26/2018 1830    Cardiac Enzymes: Recent Labs  Lab 04/26/18 1726 04/27/18 0049 04/27/18 0706  TROPONINI 0.04* 0.07* 0.04*   CBG: Recent Labs  Lab 04/27/18 2126 04/28/18 0850 04/28/18 1152 04/28/18 1644 04/28/18 2129  GLUCAP 79 120* 149* 155* 82   Iron Studies: No results for  input(s): IRON, TIBC, TRANSFERRIN, FERRITIN in the last 72 hours. Lab Results  Component Value Date   INR 1.27 04/29/2018   INR 1.00 04/28/2018   INR 1.20 02/25/2018   Studies/Results: Nm Pulmonary Perf And Vent  Result Date: 04/27/2018 CLINICAL DATA:  Shortness of breath EXAM: NUCLEAR MEDICINE VENTILATION - PERFUSION LUNG SCAN VIEWS: Anterior, posterior, left lateral, right lateral, RAO, LAO, RPO, LPO-ventilation perfusion RADIOPHARMACEUTICALS:  32.3 mCi of Tc-9m DTPA aerosol inhalation and 4.2 mCi Tc45m-MAA IV COMPARISON:  Ventilation perfusion lung scans March 20, 2018; chest radiograph April 26, 2018 FINDINGS: Ventilation: The distribution of uptake on the ventilation study is homogeneous and symmetric without appreciable change from prior study. Perfusion: There is a focal perfusion defect in the superior segment of the left lower lobe, not present on prior lung scan examination. There is also a perfusion defect in the anterior segment of the left lower lobe, not present on prior lung scan examination. There are no corresponding ventilation defects or left lower lobe chest radiographic abnormality. No perfusion defects are noted elsewhere. IMPRESSION: There are 2 segmental level perfusion defects in the left lower lobe which were not present on prior lung scan and are not appreciable on the ventilation study. This study therefore constitutes a high probability of pulmonary embolus based on PIOPED II criteria. Critical Value/emergent results were called by telephone at the time of interpretation on 04/27/2018 at 2:37 pm to Dr. Shelly Coss , who verbally acknowledged these results. Electronically Signed   By: Lowella Grip III M.D.   On: 04/27/2018 14:38   Ir Removal Tun Cv Cath W/o Fl  Result Date: 04/27/2018 INDICATION: 62 year old with end-stage renal disease and tunneled dialysis catheter. Patient has a Candida infection and needs a removal of the dialysis catheter. EXAM: REMOVAL TUNNELED  DIALYSIS CATHETER MEDICATIONS: None ANESTHESIA/SEDATION: None FLUOROSCOPY TIME:  None COMPLICATIONS: None immediate. PROCEDURE: Informed written consent was obtained from the patient after a thorough discussion of the procedural risks, benefits and alternatives. All questions were addressed. Maximal Sterile Barrier Technique was utilized including caps, mask, sterile gowns, sterile gloves, sterile drape, hand hygiene and skin antiseptic. A timeout was performed prior to the initiation of the procedure. [name] right chest and catheter was prepped and draped in a normal sterile fashion. Heparin was removed from both ports of catheter. 1% lidocaine was used for local anesthesia.  Using gentle blunt dissection the cuff of the catheter was exposed and the catheter was removed in it's entirety. Pressure was held till hemostasis was obtained. A sterile dressing was applied. In addition, the patient had 2 sutures in the right lower neck which were both removed. The patient tolerated the procedure well with no immediate complications. IMPRESSION: Successful catheter removal as described above. Electronically Signed   By: Markus Daft M.D.   On: 04/27/2018 13:02   Medications: . sodium chloride Stopped (04/27/18 1957)  . anidulafungin Stopped (04/29/18 0008)  . heparin 1,100 Units/hr (04/28/18 1900)   . calcitRIOL  0.25 mcg Oral QODAY  . Chlorhexidine Gluconate Cloth  6 each Topical Q0600  . feeding supplement (NEPRO CARB STEADY)  237 mL Oral BID BM  . feeding supplement (PRO-STAT SUGAR FREE 64)  30 mL Oral BID  . insulin aspart  5 Units Subcutaneous TID WC  . insulin glargine  5 Units Subcutaneous QHS  . metoprolol tartrate  25 mg Oral BID  . multivitamin  1 tablet Oral QHS  . sodium chloride flush  3 mL Intravenous Q12H  . Warfarin - Pharmacist Dosing Inpatient   Does not apply [phone]

## 2018-04-30 ENCOUNTER — Inpatient Hospital Stay (HOSPITAL_COMMUNITY): Payer: Medicaid Other

## 2018-04-30 ENCOUNTER — Encounter (HOSPITAL_COMMUNITY): Payer: Self-pay | Admitting: Vascular Surgery

## 2018-04-30 DIAGNOSIS — Z01810 Encounter for preprocedural cardiovascular examination: Secondary | ICD-10-CM

## 2018-04-30 LAB — BASIC METABOLIC PANEL
ANION GAP: 9 (ref 5–15)
BUN: 30 mg/dL — ABNORMAL HIGH (ref 8–23)
CALCIUM: 8 mg/dL — AB (ref 8.9–10.3)
CHLORIDE: 106 mmol/L (ref 98–111)
CO2: 21 mmol/L — AB (ref 22–32)
Creatinine, Ser: 6.94 mg/dL — ABNORMAL HIGH (ref 0.44–1.00)
GFR calc Af Amer: 6 mL/min — ABNORMAL LOW (ref >60)
GFR calc non Af Amer: 6 mL/min — ABNORMAL LOW (ref >60)
GLUCOSE: 81 mg/dL (ref 70–99)
Potassium: 4.7 mmol/L (ref 3.5–5.1)
Sodium: 136 mmol/L (ref 135–145)

## 2018-04-30 LAB — PROTIME-INR
INR: 2.55
INR: 2.96
Prothrombin Time: 27.2 seconds — ABNORMAL HIGH (ref 11.4–15.2)
Prothrombin Time: 30.6 seconds — ABNORMAL HIGH (ref 11.4–15.2)

## 2018-04-30 LAB — GLUCOSE, CAPILLARY
GLUCOSE-CAPILLARY: 125 mg/dL — AB (ref 70–99)
GLUCOSE-CAPILLARY: 134 mg/dL — AB (ref 70–99)
Glucose-Capillary: 183 mg/dL — ABNORMAL HIGH (ref 70–99)

## 2018-04-30 LAB — CBC
HEMATOCRIT: 29 % — AB (ref 36.0–46.0)
HEMOGLOBIN: 8.8 g/dL — AB (ref 12.0–15.0)
MCH: 24.2 pg — ABNORMAL LOW (ref 26.0–34.0)
MCHC: 30.3 g/dL (ref 30.0–36.0)
MCV: 79.9 fL (ref 78.0–100.0)
Platelets: 375 10*3/uL (ref 150–400)
RBC: 3.63 MIL/uL — ABNORMAL LOW (ref 3.87–5.11)
RDW: 20.2 % — AB (ref 11.5–15.5)
WBC: 6.1 10*3/uL (ref 4.0–10.5)

## 2018-04-30 LAB — HEPARIN LEVEL (UNFRACTIONATED): HEPARIN UNFRACTIONATED: 0.31 [IU]/mL (ref 0.30–0.70)

## 2018-04-30 MED ORDER — HEPARIN (PORCINE) IN NACL 100-0.45 UNIT/ML-% IJ SOLN
1100.0000 [IU]/h | INTRAMUSCULAR | Status: DC
  Administered 2018-04-30 – 2018-05-02 (×3): 1100 [IU]/h via INTRAVENOUS
  Filled 2018-04-30 (×3): qty 250

## 2018-04-30 MED ORDER — GLUCERNA SHAKE PO LIQD
237.0000 mL | Freq: Three times a day (TID) | ORAL | Status: DC
  Administered 2018-04-30: 237 mL via ORAL

## 2018-04-30 MED ORDER — CHLORHEXIDINE GLUCONATE CLOTH 2 % EX PADS
6.0000 | MEDICATED_PAD | Freq: Every day | CUTANEOUS | Status: DC
  Administered 2018-04-30 – 2018-05-03 (×3): 6 via TOPICAL

## 2018-04-30 NOTE — Progress Notes (Addendum)
ANTICOAGULATION CONSULT NOTE - Follow Up Consult  Pharmacy Consult for Heparin >> Warfarin Indication: pulmonary embolus  No Known Allergies  Patient Measurements: Height: 5\' 1"  (154.9 cm) Weight: 129 lb 6.6 oz (58.7 kg) IBW/kg (Calculated) : 47.8 Heparin Dosing Weight: 58 kg  Vital Signs: Temp: 98.1 F (36.7 C) (08/21 1108) Temp Source: Oral (08/21 1108) BP: 96/72 (08/21 1257) Pulse Rate: 100 (08/21 1257)  Labs: Recent Labs    04/28/18 1421 04/29/18 0605 04/29/18 1038 04/30/18 0459  HGB 10.2* 9.1* 8.8* 8.8*  HCT 33.6* 29.9* 29.4* 29.0*  PLT 393 325 365 375  LABPROT 13.1 15.8*  --  27.2*  INR 1.00 1.27  --  2.55  HEPARINUNFRC 0.43 0.60  --  0.31  CREATININE  --  6.42* 7.21* 6.94*    Estimated Creatinine Clearance: 6.7 mL/min (A) (by C-G formula based on SCr of 6.94 mg/dL (H)).   Medications:  Scheduled:  . calcitRIOL  0.25 mcg Oral QODAY  . Chlorhexidine Gluconate Cloth  6 each Topical Q0600  . Chlorhexidine Gluconate Cloth  6 each Topical Q0600  . feeding supplement (GLUCERNA SHAKE)  237 mL Oral TID BM  . feeding supplement (PRO-STAT SUGAR FREE 64)  30 mL Oral BID  . insulin aspart  5 Units Subcutaneous TID WC  . insulin glargine  5 Units Subcutaneous QHS  . metoprolol tartrate  25 mg Oral BID  . multivitamin  1 tablet Oral QHS  . sodium chloride flush  3 mL Intravenous Q12H  . Warfarin - Pharmacist Dosing Inpatient   Does not apply q1800   Infusions:  . sodium chloride 0 mL/hr at 04/27/18 1958  . sodium chloride Stopped (04/30/18 0408)  . anidulafungin Stopped (04/30/18 0206)  . heparin      Assessment: 62 yo F on heparin >> warfarin bridge for PE as diagnoses by VQ scan.  Today is day #3 of 5 minimum overlap therapy.  Heparin level is therapeutic on 1100 units/hr.  INR increased from 1.27 > 2.55 after only two doses of warfarin.  Noted MD order to discontinue heparin.  Spoke with MD and explained need for 5 day overlap given hypercoagulable state with  warfarin initiation.  Spoke with RN and heparin restarted (only off ~30 minutes).    Noted jump in INR.  Question if accurate.  Will repeat INR this afternoon and address warfarin dose at that time.  Goal of Therapy:  INR 2-3 Heparin level 0.3-0.7 units/ml Monitor platelets by anticoagulation protocol: Yes   Plan:  Restart heparin at 1100 units/hr Heparin should continue until 8/23 Follow-up repeat INR Daily INR, CBC, and heparin level  [employer], Pharm.D., BCPS Clinical Pharmacist  04/30/2018 4:14 PM   Addendum: Repeat INR up to 2.96 (this was 4 hours post HD so should be accurate). INR with very large jump in past 24 hours past 2 doses of coumadin.   Plan: 1) With such a large jump in INR, will hold coumadin tonight 2) Continue heparin bridge for minimum of 5 days (until 8/23) 3) Daily INR, CBC, and heparin level  Sherlon Handing, PharmD, BCPS Clinical pharmacist 04/30/2018 5:28 PM

## 2018-04-30 NOTE — Progress Notes (Signed)
   VASCULAR SURGERY ASSESSMENT & PLAN:   1 Day Post-Op s/p: Placement of new tunneled dialysis catheter.  Duplex of her left upper arm fistula is pending.  Dr. Hale Bogus note appreciated.  If it is clear that she does not have endocarditis then we could potentially consider placing new access in approximately 2 weeks if the upper arm fistula on the left is not salvageable.  SUBJECTIVE:   Resting comfortably.  PHYSICAL EXAM:   Vitals:   04/30/18 0658 04/30/18 0708 04/30/18 0730 04/30/18 0800  BP: (!) 157/84 (!) 149/101 136/88 123/90  Pulse: 81 80 81 91  Resp: 15     Temp: 98.5 F (36.9 C)     TempSrc: Oral     SpO2: 95%     Weight: 61.6 kg     Height:       Cath site looks fine.  LABS:   Lab Results  Component Value Date   WBC 6.1 04/30/2018   HGB 8.8 (L) 04/30/2018   HCT 29.0 (L) 04/30/2018   MCV 79.9 04/30/2018   PLT 375 04/30/2018   Lab Results  Component Value Date   CREATININE 6.94 (H) 04/30/2018   Lab Results  Component Value Date   INR 2.55 04/30/2018   CBG (last 3)  Recent Labs    04/29/18 1351 04/29/18 1727 04/29/18 2105  GLUCAP 86 79 70    PROBLEM LIST:    Principal Problem:   Fungemia Active Problems:   ESRD (end stage renal disease) ([place])   Anemia   DM (diabetes mellitus), type 2 with renal complications ([facility])   Sepsis ([place])   Dyspnea   Chest pain   Tachycardia   Pulmonary embolism ([place])   Hemodialysis catheter infection, initial encounter ([place])   CURRENT MEDS:   . calcitRIOL  0.25 mcg Oral QODAY  . Chlorhexidine Gluconate Cloth  6 each Topical Q0600  . feeding supplement (NEPRO CARB STEADY)  237 mL Oral BID BM  . feeding supplement (PRO-STAT SUGAR FREE 64)  30 mL Oral BID  . insulin aspart  5 Units Subcutaneous TID WC  . insulin glargine  5 Units Subcutaneous QHS  . metoprolol tartrate  25 mg Oral BID  . multivitamin  1 tablet Oral QHS  . sodium chloride flush  3 mL Intravenous Q12H  . Warfarin - Pharmacist Dosing Inpatient    Does not apply [place]: [phone] Office: [phone] 04/30/2018

## 2018-04-30 NOTE — Progress Notes (Signed)
Left hemodialysis access surveillance has been completed. 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  Brachial artery flow volume noted to be 368 cc/min.   04/30/18 3:26 PM Rhonda Lynch RVT

## 2018-04-30 NOTE — Progress Notes (Addendum)
Initial Nutrition Assessment  DOCUMENTATION CODES:   Not applicable  INTERVENTION:    Glucerna Shake po TID, each supplement provides 220 kcal and 10 grams of protein  Continue renal multivitamin  NUTRITION DIAGNOSIS:   Increased nutrient needs related to chronic illness as evidenced by estimated needs.  GOAL:   Patient will meet greater than or equal to 90% of their needs  MONITOR:   PO intake, Supplement acceptance, Labs, Weight trends, I & O's  REASON FOR ASSESSMENT:   Malnutrition Screening Tool    ASSESSMENT:   62 yo female with PMH of substance abuse, ESRD on HD, DM-2, and HTN who was admitted on 8/17 with sepsis, AMS. Found to have candida parapsilosis fungemia. S/P TEE on 8/20.  Unable to speak with patient as she was in HD this morning and currently out of her room for another procedure. During previous admission, patient did not like Nepro supplement, will change to [facility], which she accepted a little better.   Per chart review and review of weight encounters, patient has lost ~19% of usual weight within the past 1.5 months. Suspect some of this could be related to fluid loss with HD, however, suspect she has lost some LBM as well as she has not been eating well per progress notes.   Labs reviewed. Corrected calcium 9.92 Phosphorus 5.2  Potassium 4.7 Medications reviewed and include Lantus, Novolog, Rena-vit.  Suspect patient has some degree of malnutrition, given reported poor intake and recent weight loss.    NUTRITION - FOCUSED PHYSICAL EXAM:  Unable to complete NFPE at this time.  Diet Order:   Diet Order            Diet renal/carb modified with fluid restriction Fluid restriction: 1200 mL Fluid; Room service appropriate? Yes; Fluid consistency: Thin  Diet effective now              EDUCATION NEEDS:   No education needs have been identified at this time  Skin:  Skin Assessment: Skin Integrity Issues: Skin Integrity Issues::  Incisions Incisions: R chest  Last BM:  8/21 type 6  Height:   Ht Readings from Last 1 Encounters:  04/28/18 5\' 1"  (1.549 m)    Weight:   Wt Readings from Last 1 Encounters:  04/30/18 58.7 kg    Ideal Body Weight:  47.7 kg  BMI:  Body mass index is 24.45 kg/m.  Estimated Nutritional Needs:   Kcal:  5400-8676  Protein:  80-90 gm  Fluid:  UOP + 1000 ml     Molli Barrows, RD, LDN, [facility] Pager [phone] After Hours Pager [phone]

## 2018-04-30 NOTE — Progress Notes (Signed)
[name], female   DOB: [birth year], 62 y.o.   MRN: [id number]         [facility]    Date of Admission:  04/26/2018   Day 4 anidulafungin         [name] had transient candidemia.  1 of 2 blood cultures from 04/23/2018 grew Candida parapsilosis.  Repeat blood cultures on 04/26/2018 remain negative.  She had a TEE which showed thickening of the tricuspid valve.  Dr. Radford Pax raised the question of a possible small tricuspid valve vegetation.  Dr. Acie Fredrickson seem to indicate in his note that he did not believe there was any evidence of endocarditis.  I understand that Dr. Acie Fredrickson is currently out of town.  I have sent a message to Dr. Radford Pax to see if she can help clarify the level of concern about possible endocarditis.  We will continue anidulafungin for now.  I recommend ophthalmology evaluation to help rule out endophthalmitis.         Michel Bickers, MD [facility] [phone] pager   [phone] cell 04/30/2018, 2:35 PM

## 2018-04-30 NOTE — Progress Notes (Addendum)
[facility] Progress Note   Dialysis Orders: TTS East 4h   60kg   2/2.25 bath  (new) TDC 8/20 by VVS/ LUA AVF maturing Hep 2000 - venofer 100 every HD x 10  - Mircera 150 q 2 - last dose 8/17 - vit d 0.25 ug mwf  Assessment/Plan: 1.Candida parapsilosis bacteremia -had been d/c 8/15 and readmitted 8/17when results from prior admission returned + for candida. Had [facility] removed here 8/18. 1 set of BC + microccocus 8/9, f/u surveillance BC 1/2 + candida 8/14- cultures 8/17 since then no growth. Is on Eraxis. TEE done 8/20 is suspicious for possible TV vegetation, though Dr. Cathie Olden, doesn't think she has endocarditis. Per Dr. Hale Bogus note she needs a dilated eye exam to be certain she doesn't have endophthalmitis when can complicate fungemia. For now she needs 2 weeks of Eraxis from the negative culture date of 8/17 pending fluconazole susceptibility testing. 2. ESRD-TTS HD.  HD today off schedule - plan HD again tomorrow to get back no schedule. 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 yesterday 8/20.  AVF seems to have a high pitched bruit today  K 4.7 3. Anemia- hgb 9.1 >8.8-quite variable -last dose of Mircera was 8/17- o need to redose due 8/31- holding on Fe for now last tsat 18% 4. Secondary hyperparathyroidism- continue calcitriol/ not on binders yet 5.HTN/volume- titrate edw 6. Nutrition-alb 1.6 - add supplement/mutivit- intake poor likely losing EDW 7. RLL PNA -meds per primary 8. Chronic thrombocytopenia- not an issue now 9. Hep C + 10. 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 + VQ scan 8/18 - on IV heparin - will need transition to warfarin when other issues resolved   Myriam Jacobson, PA-C [place] 04/30/2018,8:35 AM  LOS: 4 days   Pt seen, examined and agree w A/P as  above.  Kelly Splinter MD [facility] pager [phone]   04/30/2018, 11:15 AM    Subjective:   C/o RLQ pain, No N, V. Hungry. Didn't eat much yesterday. Breathing ok  Objective Vitals:   04/30/18 0658 04/30/18 0708 04/30/18 0730 04/30/18 0800  BP: (!) 157/84 (!) 149/101 136/88 123/90  Pulse: 81 80 81 91  Resp: 15     Temp: 98.5 F (36.9 C)     TempSrc: Oral     SpO2: 95%     Weight: 61.6 kg     Height:       Physical Exam General: ill appearing, moaning and mumbling a lot but able to speak clearly when pressed to do so Heart: RRR Lungs: dim BS poor expansion and effort Abdomen: soft obese RLQ tenderness no rebound/guarding + BS Extremities: no sig LE edema Dialysis Access:  Left upper AVF + bruit and right [facility]   Additional Objective Labs: Basic Metabolic Panel: Recent Labs  Lab 04/24/18 0621  04/29/18 0605 04/29/18 1038 04/30/18 0459  NA 138   < > 138 136 136  K 3.9   < > 4.5 4.3 4.7  CL 103   < > 104 105 106  CO2 25   < > 22 23 21*  GLUCOSE 121*   < > 109* 91 81  BUN 34*   < > 31* 30* 30*  CREATININE 5.88*   < > 6.42* 7.21* 6.94*  CALCIUM 7.7*   < > 8.2* 7.9* 8.0*  PHOS 5.2*  --   --  5.2*  --    < > = values in this interval not displayed.   Liver Function Tests: Recent Labs  Lab 04/24/18 0621 04/27/18 0706 04/29/18 1038  AST  --  28  --   ALT  --  14  --   ALKPHOS  --  113  --   BILITOT  --  0.6  --   PROT  --  5.3*  --   ALBUMIN 1.5* 1.6* 1.6*   No results for input(s): LIPASE, AMYLASE in the last 168 hours. CBC: Recent Labs  Lab 04/27/18 2357 04/28/18 1421 04/29/18 0605 04/29/18 1038 04/30/18 0459  WBC 4.7 4.8 5.3 4.8 6.1  HGB 8.7* 10.2* 9.1* 8.8* 8.8*  HCT 27.8* 33.6* 29.9* 29.4* 29.0*  MCV 79.0 80.8 79.7 81.2 79.9  PLT 283 393 325 365 375   Blood Culture    Component Value Date/Time   SDES BLOOD RIGHT HAND 04/28/2018 0842   SPECREQUEST  04/28/2018 0842    BOTTLES DRAWN AEROBIC ONLY Blood Culture adequate volume    CULT  04/28/2018 0842    NO GROWTH 1 DAY Performed at [facility] [address] [place], [place] [address]    REPTSTATUS PENDING 04/28/2018 5883    Cardiac Enzymes: Recent Labs  Lab 04/26/18 1726 04/27/18 0049 04/27/18 0706  TROPONINI 0.04* 0.07* 0.04*   CBG: Recent Labs  Lab 04/28/18 2129 04/29/18 1053 04/29/18 1351 04/29/18 1727 04/29/18 2105  GLUCAP 82 92 86 79 70   Iron Studies: No results for input(s): IRON, TIBC, TRANSFERRIN, FERRITIN in the last 72 hours. Lab Results  Component Value Date   INR 2.55 04/30/2018   INR 1.27 04/29/2018   INR 1.00 04/28/2018   Studies/Results: Dg Chest Port 1 View  Result Date: 04/29/2018 CLINICAL DATA:  End-stage renal disease. EXAM: PORTABLE CHEST 1 VIEW COMPARISON:  04/26/2018. FINDINGS: Dual-lumen right IJ line noted with tip over right atrium. Cardiomegaly with normal pulmonary vascularity. Right base atelectasis/infiltrate unchanged. No pleural effusion or pneumothorax. IMPRESSION: 1.  Dual-lumen right IJ catheter noted with tip over right atrium. 2.  Cardiomegaly.  Normal pulmonary vascularity. 3. Persistent right base infiltrate. No interim change. Continued follow-up exams to demonstrate clearing. Electronically Signed   By: Marcello Moores  Register   On: 04/29/2018 14:35   Dg Cyndy Freeze Guide Cv Line-no Report  Result Date: 04/29/2018 Fluoroscopy was utilized by the requesting physician.  No radiographic interpretation.   Medications: . sodium chloride 0 mL/hr at 04/27/18 1958  . sodium chloride Stopped (04/30/18 0408)  . anidulafungin Stopped (04/30/18 0206)  . heparin 1,100 Units/hr (04/30/18 0500)   . calcitRIOL  0.25 mcg Oral QODAY  . Chlorhexidine Gluconate Cloth  6 each Topical Q0600  . feeding supplement (NEPRO CARB STEADY)  237 mL Oral BID BM  . feeding supplement (PRO-STAT SUGAR FREE 64)  30 mL Oral BID  . insulin aspart  5 Units Subcutaneous TID WC  . insulin glargine  5 Units Subcutaneous QHS  . metoprolol  tartrate  25 mg Oral BID  . multivitamin  1 tablet Oral QHS  . sodium chloride flush  3 mL Intravenous Q12H  . Warfarin - Pharmacist Dosing Inpatient   Does not apply [phone]

## 2018-04-30 NOTE — Progress Notes (Signed)
PROGRESS NOTE    Rhonda Lynch  HKV:[id number] DOB: 1955/12/02 DOA: 04/26/2018 PCP: Clent Demark, PA-C   Brief Narrative: Patient is a 88 old female with past medical history of ESRD on dialysis on Tuesday, ANCA related vasculitis, Thursday and Saturday, hypertension, diabetes mellitus who was brought back for admission because of positive blood culture for Candida.  She was recently discharged from here after being treated for pneumonia.  Patient was also complaining of some shortness of breath and chest pain at home.  Patient admitted for further management.  Patient also found to have pulmonary embolism as per the VQ scan.  Currently she is  Being managed  For PE, Candida fungemia, healthcare associated pneumonia.  Cardiology, nephrology and ID are following.  Assessment & Plan:   Principal Problem:   Fungemia Active Problems:   ESRD (end stage renal disease) ([place])   Anemia   DM (diabetes mellitus), type 2 with renal complications ([facility])   Sepsis ([place])   Dyspnea   Chest pain   Tachycardia   Pulmonary embolism ([place])   Hemodialysis catheter infection, initial encounter ([place])   Fungemia: Blood cultures that were drawn on her last visit showed Candida parapsilosis.  Started on Eraxis.  ID following.  Hemodialysis  catheter removed.  TEE done could not rule out vegetation in the tricuspid valve?Marland Kitchen  Showed  moderately thickened tricuspid valve leaflet.  Management as per ID. I discussed with ophthalmologist Dr. Prudencio Burly about the need for dilated ophthalmoscopy to rule out endophthalmitis.  He wants to see her as an outpatient, he has taken the information.  She will follow-up with him as an outpatient within a week after discharge.  PE/Elevated d-dimer:  D-dimer found to be elevated.   VQ scan which showed hyper probability of PE.  Started on heparin drip.  She was started on bridging with Coumadin .  She will follow-up with Coumadin clinic or her primary care physician to monitor her  INR.    Underwent right IJ tunneled catheter placement  by vascular surgery.  Currently her INR is 2.5.  We will discontinue IV heparin and she will be on warfarin now.  She needs to follow-up with her PCP or Coumadin clinic as an outpatient for regular monitoring of INR.  Will request for case manager consultation.  Healthcare associated pneumonia: Was discharged on IV cefazolin and Augmentin.  Currently she is afebrile and hemodynamically stable.  Started on Vanco and cefepime on this admission.  Chest x-ray done here showed RIGHT LOWER lung airspace disease/consolidation likely representing pneumonia but that was most likely her previous pneumonia.  Patient does not have any respiratory distress right now.  Antibiotics discontinued.  ESRD on dialysis: Nephrology following.  Permanent dialysis catheter removed and new one put.  She has AV fistula placed in left upper extremity on 02/2618 but did not function.    Chest pain: Currently resolved.  Most likely atypical chest pain.  Cardiology was following.  Troponin mild elevated and has not trended up.  Dose of metoprolol increased.   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  Currently H&H is stable.  Thrombocytopenia: Chronic  Diabetes type 2: Continue current regimen.  We will continue to monitor CBC.  History of BOOP: Completed a course of steroids.  Hypokalemia: Supplemented with potassium.  We will continue to monitor the levels.  Lactic acidosis: Resolved.   DVT prophylaxis:Heparin IV Code Status: Full Family Communication: None present at the bedside Disposition Plan: After  ID clearance on duration of antibiotics.  Consultants: Nephrology, cardiology, IR, ID  Procedures: insertion of  HD catheter  Antimicrobials: Eraxis  Subjective: Patient seen and examined the bedside this morning at the dialysis facility.  Currently her shortness of breath and chest pain have resolved.  She is a  hemodynamically stable.  Does not have any new complaints.   Objective: Vitals:   04/30/18 1030 04/30/18 1100 04/30/18 1108 04/30/18 1257  BP: (!) 89/65 (!) 89/68 96/69 96/72   Pulse: (!) 101 100 (!) 101 100  Resp:   (!) 25   Temp:   98.1 F (36.7 C)   TempSrc:   Oral   SpO2:   96%   Weight:   58.7 kg   Height:        Intake/Output Summary (Last 24 hours) at 04/30/2018 1332 Last data filed at 04/30/2018 1108 Gross per 24 hour  Intake 932.19 ml  Output 2867 ml  Net -1934.81 ml   Filed Weights   04/29/18 0652 04/30/18 0658 04/30/18 1108  Weight: 62.1 kg 61.6 kg 58.7 kg    Examination:  General exam: Not in distress, generalized weakness HEENT:PERRL,Oral mucosa moist, Ear/Nose normal on gross exam Respiratory system: Decreased air entry in the bases  cardiovascular system: S1 & S2 heard, RRR. No JVD, murmurs, rubs, gallops or clicks. Trace pedal edema.  New hemodialysis catheter on the right chest Gastrointestinal system: Abdomen is nondistended, soft and nontender. No organomegaly or masses felt. Normal bowel sounds heard. Central nervous system: Alert and oriented. No focal neurological deficits. Extremities: trace edema, no clubbing ,no cyanosis, distal peripheral pulses palpable.  Fistula on her left arm Skin: No rashes, lesions or ulcers,no icterus ,no pallor MSK: Normal muscle bulk,tone ,power Psychiatry: Judgement and insight appear normal. Mood & affect appropriate.     Data Reviewed: I have personally reviewed following labs and imaging studies  CBC: Recent Labs  Lab 04/27/18 2357 04/28/18 1421 04/29/18 0605 04/29/18 1038 04/30/18 0459  WBC 4.7 4.8 5.3 4.8 6.1  HGB 8.7* 10.2* 9.1* 8.8* 8.8*  HCT 27.8* 33.6* 29.9* 29.4* 29.0*  MCV 79.0 80.8 79.7 81.2 79.9  PLT 283 393 325 365 536   Basic Metabolic Panel: Recent Labs  Lab 04/24/18 0621  04/27/18 0706 04/27/18 2357 04/29/18 0605 04/29/18 1038 04/30/18 0459  NA 138   < > 138 136 138 136 136  K 3.9    < > 3.0* 3.7 4.5 4.3 4.7  CL 103   < > 97* 100 104 105 106  CO2 25   < > 28 26 22 23  21*  GLUCOSE 121*   < > 119* 113* 109* 91 81  BUN 34*   < > 17 22 31* 30* 30*  CREATININE 5.88*   < > 4.30* 5.22* 6.42* 7.21* 6.94*  CALCIUM 7.7*   < > 7.5* 7.9* 8.2* 7.9* 8.0*  MG  --   --  1.7  --   --   --   --   PHOS 5.2*  --   --   --   --  5.2*  --    < > = values in this interval not displayed.   GFR: Estimated Creatinine Clearance: 6.7 mL/min (A) (by C-G formula based on SCr of 6.94 mg/dL (H)). Liver Function Tests: Recent Labs  Lab 04/24/18 0621 04/27/18 0706 04/29/18 1038  AST  --  28  --   ALT  --  14  --   ALKPHOS  --  113  --  BILITOT  --  0.6  --   PROT  --  5.3*  --   ALBUMIN 1.5* 1.6* 1.6*   No results for input(s): LIPASE, AMYLASE in the last 168 hours. No results for input(s): AMMONIA in the last 168 hours. Coagulation Profile: Recent Labs  Lab 04/28/18 1421 04/29/18 0605 04/30/18 0459  INR 1.00 1.27 2.55   Cardiac Enzymes: Recent Labs  Lab 04/26/18 1726 04/27/18 0049 04/27/18 0706  TROPONINI 0.04* 0.07* 0.04*   BNP (last 3 results) No results for input(s): PROBNP in the last 8760 hours. HbA1C: No results for input(s): HGBA1C in the last 72 hours. CBG: Recent Labs  Lab 04/29/18 1053 04/29/18 1351 04/29/18 1727 04/29/18 2105 04/30/18 1157  GLUCAP 92 86 79 70 125*   Lipid Profile: No results for input(s): CHOL, HDL, LDLCALC, TRIG, CHOLHDL, LDLDIRECT in the last 72 hours. Thyroid Function Tests: No results for input(s): TSH, T4TOTAL, FREET4, T3FREE, THYROIDAB in the last 72 hours. Anemia Panel: No results for input(s): VITAMINB12, FOLATE, FERRITIN, TIBC, IRON, RETICCTPCT in the last 72 hours. Sepsis Labs: Recent Labs  Lab 04/26/18 1742 04/26/18 1941 04/27/18 2357  LATICACIDVEN 4.83* 1.92* 1.1    Recent Results (from the past 240 hour(s))  Culture, blood (routine x 2)     Status: Abnormal   Collection Time: 04/23/18  5:00 PM  Result Value Ref  Range Status   Specimen Description BLOOD RIGHT ANTECUBITAL  Final   Special Requests   Final    BOTTLES DRAWN AEROBIC AND ANAEROBIC Blood Culture adequate volume   Culture  Setup Time   Final    YEAST AEROBIC BOTTLE ONLY CRITICAL RESULT CALLED TO, READ BACK BY AND VERIFIED WITH: DR Sloan Leiter [id number] [phone] Performed at [facility], [place] [address] [place], [place] [address]    Culture CANDIDA PARAPSILOSIS (A)  Final   Report Status 04/27/2018 FINAL  Final  Blood Culture ID Panel (Reflexed)     Status: Abnormal   Collection Time: 04/23/18  5:00 PM  Result Value Ref Range Status   Enterococcus species NOT DETECTED NOT DETECTED Final   Listeria monocytogenes NOT DETECTED NOT DETECTED Final   Staphylococcus species NOT DETECTED NOT DETECTED Final   Staphylococcus aureus NOT DETECTED NOT DETECTED Final   Streptococcus species NOT DETECTED NOT DETECTED Final   Streptococcus agalactiae NOT DETECTED NOT DETECTED Final   Streptococcus pneumoniae NOT DETECTED NOT DETECTED Final   Streptococcus pyogenes NOT DETECTED NOT DETECTED Final   Acinetobacter baumannii NOT DETECTED NOT DETECTED Final   Enterobacteriaceae species NOT DETECTED NOT DETECTED Final   Enterobacter cloacae complex NOT DETECTED NOT DETECTED Final   Escherichia coli NOT DETECTED NOT DETECTED Final   Klebsiella oxytoca NOT DETECTED NOT DETECTED Final   Klebsiella pneumoniae NOT DETECTED NOT DETECTED Final   Proteus species NOT DETECTED NOT DETECTED Final   Serratia marcescens NOT DETECTED NOT DETECTED Final   Haemophilus influenzae NOT DETECTED NOT DETECTED Final   Neisseria meningitidis NOT DETECTED NOT DETECTED Final   Pseudomonas aeruginosa NOT DETECTED NOT DETECTED Final   Candida albicans NOT DETECTED NOT DETECTED Final   Candida glabrata NOT DETECTED NOT DETECTED Final   Candida krusei NOT DETECTED NOT DETECTED Final   Candida parapsilosis DETECTED (A) NOT DETECTED Final    Comment: CRITICAL RESULT CALLED TO,  READ BACK BY AND VERIFIED WITH: DR Sloan Leiter [phone] [phone]    Candida tropicalis NOT DETECTED NOT DETECTED Final    Comment: Performed at [facility],  [address], [place], [place] [address]  Culture, blood (routine x 2)     Status: None   Collection Time: 04/23/18  5:15 PM  Result Value Ref Range Status   Specimen Description BLOOD RIGHT FOREARM  Final   Special Requests   Final    BOTTLES DRAWN AEROBIC AND ANAEROBIC Blood Culture adequate volume   Culture   Final    NO GROWTH 5 DAYS Performed at [facility], [place] [address], [place], [place] [address]    Report Status 04/28/2018 FINAL  Final  Blood culture (routine x 2)     Status: None (Preliminary result)   Collection Time: 04/26/18  4:58 PM  Result Value Ref Range Status   Specimen Description BLOOD RIGHT HAND  Final   Special Requests   Final    BOTTLES DRAWN AEROBIC ONLY Blood Culture results may not be optimal due to an inadequate volume of blood received in culture bottles   Culture   Final    NO GROWTH 4 DAYS Performed at [facility], [place] [address], [place], [place] [address]    Report Status PENDING  Incomplete  Blood culture (routine x 2)     Status: None (Preliminary result)   Collection Time: 04/26/18  6:30 PM  Result Value Ref Range Status   Specimen Description BLOOD RIGHT ANTECUBITAL  Final   Special Requests   Final    BOTTLES DRAWN AEROBIC AND ANAEROBIC Blood Culture adequate volume   Culture   Final    NO GROWTH 4 DAYS Performed at [facility], [place] [address], [place], [place] [address]    Report Status PENDING  Incomplete  MRSA PCR Screening     Status: None   Collection Time: 04/26/18  8:37 PM  Result Value Ref Range Status   MRSA by PCR NEGATIVE NEGATIVE Final    Comment:        The GeneXpert MRSA Assay (FDA approved for NASAL specimens only), is one component of a comprehensive MRSA colonization surveillance program. It is not intended to diagnose MRSA infection  nor to guide or monitor treatment for MRSA infections. Performed at [facility], [place] [address] [place], [place] [address]   Culture, blood (Routine X 2) w Reflex to ID Panel     Status: None (Preliminary result)   Collection Time: 04/28/18  8:42 AM  Result Value Ref Range Status   Specimen Description BLOOD RIGHT HAND  Final   Special Requests   Final    BOTTLES DRAWN AEROBIC ONLY Blood Culture adequate volume   Culture   Final    NO GROWTH 2 DAYS Performed at [facility], [place] [address], [place],  [address]    Report Status PENDING  Incomplete         Radiology Studies: Dg Chest Port 1 View  Result Date: 04/29/2018 CLINICAL DATA:  End-stage renal disease. EXAM: PORTABLE CHEST 1 VIEW COMPARISON:  04/26/2018. FINDINGS: Dual-lumen right IJ line noted with tip over right atrium. Cardiomegaly with normal pulmonary vascularity. Right base atelectasis/infiltrate unchanged. No pleural effusion or pneumothorax. IMPRESSION: 1.  Dual-lumen right IJ catheter noted with tip over right atrium. 2.  Cardiomegaly.  Normal pulmonary vascularity. 3. Persistent right base infiltrate. No interim change. Continued follow-up exams to demonstrate clearing. Electronically Signed   By: Marcello Moores  Register   On: 04/29/2018 14:35   Dg Fluoro Guide Cv Line-no Report  Result Date: 04/29/2018 Fluoroscopy was utilized by the  requesting physician.  No radiographic interpretation.        Scheduled Meds: . calcitRIOL  0.25 mcg Oral QODAY  . Chlorhexidine Gluconate Cloth  6 each Topical Q0600  . Chlorhexidine Gluconate Cloth  6 each Topical Q0600  . feeding supplement (NEPRO CARB STEADY)  237 mL Oral BID BM  . feeding supplement (PRO-STAT SUGAR FREE 64)  30 mL Oral BID  . insulin aspart  5 Units Subcutaneous TID WC  . insulin glargine  5 Units Subcutaneous QHS  . metoprolol tartrate  25 mg Oral BID  . multivitamin  1 tablet Oral QHS  . sodium chloride flush  3 mL Intravenous Q12H  .  Warfarin - Pharmacist Dosing Inpatient   Does not apply q1800   Continuous Infusions: . sodium chloride 0 mL/hr at 04/27/18 1958  . sodium chloride Stopped (04/30/18 0408)  . anidulafungin Stopped (04/30/18 0206)     LOS: 4 days    Time spent: 25 mins.More than 50% of that time was spent in counseling and/or coordination of care.      Shelly Coss, MD [facility] Pager [phone]  If 7PM-7AM, please contact night-coverage [email or web address] Password [email or web address] 04/30/2018, 1:32 PM

## 2018-05-01 DIAGNOSIS — B377 Candidal sepsis: Secondary | ICD-10-CM

## 2018-05-01 LAB — BASIC METABOLIC PANEL
Anion gap: 9 (ref 5–15)
BUN: 16 mg/dL (ref 8–23)
CALCIUM: 8 mg/dL — AB (ref 8.9–10.3)
CO2: 28 mmol/L (ref 22–32)
Chloride: 101 mmol/L (ref 98–111)
Creatinine, Ser: 4.51 mg/dL — ABNORMAL HIGH (ref 0.44–1.00)
GFR, EST AFRICAN AMERICAN: 11 mL/min — AB (ref >60)
GFR, EST NON AFRICAN AMERICAN: 9 mL/min — AB (ref >60)
Glucose, Bld: 101 mg/dL — ABNORMAL HIGH (ref 70–99)
Potassium: 3.7 mmol/L (ref 3.5–5.1)
SODIUM: 138 mmol/L (ref 135–145)

## 2018-05-01 LAB — GLUCOSE, CAPILLARY
GLUCOSE-CAPILLARY: 228 mg/dL — AB (ref 70–99)
Glucose-Capillary: 69 mg/dL — ABNORMAL LOW (ref 70–99)
Glucose-Capillary: 103 mg/dL — ABNORMAL HIGH (ref 70–99)
Glucose-Capillary: 106 mg/dL — ABNORMAL HIGH (ref 70–99)

## 2018-05-01 LAB — MISC LABCORP TEST (SEND OUT)
LabCorp test name: 9
Labcorp test code: 183119

## 2018-05-01 LAB — CULTURE, BLOOD (ROUTINE X 2)
CULTURE: NO GROWTH
Culture: NO GROWTH
Special Requests: ADEQUATE

## 2018-05-01 LAB — PROTIME-INR
INR: 3.26
Prothrombin Time: 33 seconds — ABNORMAL HIGH (ref 11.4–15.2)

## 2018-05-01 LAB — HEPARIN LEVEL (UNFRACTIONATED): HEPARIN UNFRACTIONATED: 0.62 [IU]/mL (ref 0.30–0.70)

## 2018-05-01 MED ORDER — FLUCONAZOLE 100 MG PO TABS
200.0000 mg | ORAL_TABLET | Freq: Every day | ORAL | Status: DC
  Administered 2018-05-02: 200 mg via ORAL
  Filled 2018-05-01: qty 2

## 2018-05-01 MED ORDER — FLUCONAZOLE 100 MG PO TABS
400.0000 mg | ORAL_TABLET | Freq: Once | ORAL | Status: AC
  Administered 2018-05-01: 400 mg via ORAL
  Filled 2018-05-01: qty 4

## 2018-05-01 MED ORDER — HEPARIN SODIUM (PORCINE) 1000 UNIT/ML DIALYSIS: 2000.0000 [IU] | Freq: Once | INTRAMUSCULAR | Status: DC

## 2018-05-01 NOTE — Progress Notes (Signed)
Patient off unit to HDU 

## 2018-05-01 NOTE — Progress Notes (Signed)
ANTICOAGULATION CONSULT NOTE - Follow Up Consult  Pharmacy Consult for Heparin + Warfarin Indication: pulmonary embolus  No Known Allergies  Patient Measurements: Height: 5\' 1"  (154.9 cm) Weight: 130 lb 1.1 oz (59 kg)(standing weight) IBW/kg (Calculated) : 47.8 Heparin Dosing Weight: 58 kg  Vital Signs: Temp: 97.7 F (36.5 C) (08/22 0809) Temp Source: Oral (08/22 0809) BP: 143/87 (08/22 1000) Pulse Rate: 87 (08/22 1000)  Labs: Recent Labs    04/29/18 0605 04/29/18 1038 04/30/18 0459 04/30/18 1532 05/01/18 0319  HGB 9.1* 8.8* 8.8*  --   --   HCT 29.9* 29.4* 29.0*  --   --   PLT 325 365 375  --   --   LABPROT 15.8*  --  27.2* 30.6* 33.0*  INR 1.27  --  2.55 2.96 3.26  HEPARINUNFRC 0.60  --  0.31  --  0.62  CREATININE 6.42* 7.21* 6.94*  --  4.51*    Estimated Creatinine Clearance: 10.4 mL/min (A) (by C-G formula based on SCr of 4.51 mg/dL (H)).   Medications:  Scheduled:  . calcitRIOL  0.25 mcg Oral QODAY  . Chlorhexidine Gluconate Cloth  6 each Topical Q0600  . Chlorhexidine Gluconate Cloth  6 each Topical Q0600  . feeding supplement (GLUCERNA SHAKE)  237 mL Oral TID BM  . feeding supplement (PRO-STAT SUGAR FREE 64)  30 mL Oral BID  . [START ON 05/02/2018] heparin  2,000 Units Dialysis Once in dialysis  . insulin aspart  5 Units Subcutaneous TID WC  . insulin glargine  5 Units Subcutaneous QHS  . metoprolol tartrate  25 mg Oral BID  . multivitamin  1 tablet Oral QHS  . sodium chloride flush  3 mL Intravenous Q12H  . Warfarin - Pharmacist Dosing Inpatient   Does not apply q1800   Infusions:  . sodium chloride 0 mL/hr at 04/27/18 1958  . sodium chloride Stopped (04/30/18 0408)  . anidulafungin 100 mg (04/30/18 2051)  . heparin 1,100 Units/hr (04/30/18 1630)    Assessment: 62 yo F on heparin >> warfarin bridge for PE as diagnosed by VQ scan.  Today is day #4 of 5 minimum overlap therapy.  Heparin level is therapeutic on 1100 units/hr (HL 0.62 << 0.31, goal of  0.3-0.7).  INR continued to increase today despite holding warfarin (INR 3.26 << 2.96, goal of 2-3). It was discussed with the MD on 8/21 that 5 days overlap is recommended given hypercoagulable state with warfarin initiation. Will continue heparin drip, hold warfarin dose again today.  Goal of Therapy:  INR 2-3 Heparin level 0.3-0.7 units/ml Monitor platelets by anticoagulation protocol: Yes   Plan:  Continue Heparin at 1100 units/hr Heparin should continue until 8/23 Hold Warfarin dose today Will continue to monitor for any signs/symptoms of bleeding and will follow up with heparin level and PT/INR in the a.m.   Thank you for allowing pharmacy to be a part of this patient's care.  Alycia Rossetti, PharmD, BCPS Clinical Pharmacist Pager: [phone] Clinical phone for 05/01/2018 from 7a-3:30p: [phone] If after 3:30p, please call main pharmacy at: [phone] Please check AMION for all [place] numbers 05/01/2018 10:12 AM

## 2018-05-01 NOTE — Progress Notes (Signed)
PROGRESS NOTE    Rhonda Lynch  KNL:[id number] DOB: 1955/10/17 DOA: 04/26/2018 PCP: Clent Demark, PA-C   Brief Narrative: Patient is a 41 old female with past medical history of ESRD on dialysis on Tuesday, ANCA related vasculitis, Thursday and Saturday, hypertension, diabetes mellitus who was brought back for admission because of positive blood culture for Candida.  She was recently discharged from here after being treated for pneumonia.  Patient was also complaining of some shortness of breath and chest pain at home.  Patient admitted for further management.  Patient also found to have pulmonary embolism as per the VQ scan.  Currently she is  Being managed  For PE, Candida fungemia, healthcare associated pneumonia.  Cardiology, nephrology and ID are following.  Assessment & Plan:   Principal Problem:   Fungemia Active Problems:   ESRD (end stage renal disease) ([place])   Anemia   DM (diabetes mellitus), type 2 with renal complications ([facility])   Sepsis ([place])   Dyspnea   Chest pain   Tachycardia   Pulmonary embolism ([place])   Hemodialysis catheter infection, initial encounter ([place])   Fungemia: Blood cultures that were drawn on her last visit showed Candida parapsilosis.  Started on Eraxis.  ID following.  Hemodialysis  catheter removed.  TEE done could not rule out vegetation in the tricuspid valve?Marland Kitchen  Showed  moderately thickened tricuspid valve leaflet.  Management as per ID.  Waiting for final sensitivity report which will guide the disposition plan.  Discussed with Dr. Megan Salon today. I also discussed with ophthalmologist Dr. Prudencio Burly about the need for dilated ophthalmoscopy to rule out endophthalmitis.  He wants to see her as an outpatient, he has taken the information.  She will follow-up with him as an outpatient within a week after discharge.  PE/Elevated d-dimer:  D-dimer found to be elevated.   VQ scan which showed hyper probability of PE.  Started on heparin drip.  She was  started on bridging with Coumadin .  She will follow-up with Coumadin clinic or her primary care physician to monitor her INR.    Underwent right IJ tunneled catheter placement  by vascular surgery.  Currently her INR supratherapeutic.  We will hold warfarin and just continue on heparin. she needs to follow-up with her PCP or Coumadin clinic as an outpatient for regular monitoring of INR.  Will request for case manager consultation.  Healthcare associated pneumonia: Was discharged on IV cefazolin and Augmentin.  Currently she is afebrile and hemodynamically stable.  Started on Vanco and cefepime on this admission.  Chest x-ray done here showed RIGHT LOWER lung airspace disease/consolidation likely representing pneumonia but that was most likely her previous pneumonia.  Patient does not have any respiratory distress right now.  Antibiotics discontinued.  ESRD on dialysis: Nephrology following.  Permanent dialysis catheter removed and new one put.  She has AV fistula placed in left upper extremity on 02/2618 but did not function.    Chest pain: Currently resolved.  Most likely atypical chest pain.  Cardiology was following.  Troponin mild elevated and has not trended up.  Dose of metoprolol increased.   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  Currently H&H is stable.  Thrombocytopenia: Chronic  Diabetes type 2: Continue current regimen.  We will continue to monitor CBC.  History of BOOP: Completed a course of steroids.  Hypokalemia: Supplemented with potassium.  We will continue to monitor the levels.  Lactic acidosis: Resolved.   DVT prophylaxis:Heparin  IV Code Status: Full Family Communication: None present at the bedside Disposition Plan: After ID clearance on duration and type of antibiotics.  Consultants: Nephrology, cardiology, IR, ID  Procedures: insertion of  HD catheter  Antimicrobials: Eraxis  Subjective: Patient seen and  examined at the bedside today.  Remains comfortable.  No new issues/events.  Hemodynamically stable   Objective: Vitals:   05/01/18 1100 05/01/18 1130 05/01/18 1200 05/01/18 1222  BP: 132/84 126/80 (!) 126/51 138/77  Pulse: 84 91 86 84  Resp: (!) 26 (!) 25 (!) 26 (!) 30  Temp:    98.3 F (36.8 C)  TempSrc:    Oral  SpO2:    96%  Weight:    59 kg  Height:        Intake/Output Summary (Last 24 hours) at 05/01/2018 1511 Last data filed at 05/01/2018 1222 Gross per 24 hour  Intake 280 ml  Output 0 ml  Net 280 ml   Filed Weights   05/01/18 0433 05/01/18 0809 05/01/18 1222  Weight: 59.9 kg 59 kg 59 kg    Examination:  General exam: Not in distress, generalized weakness HEENT:PERRL,Oral mucosa moist, Ear/Nose normal on gross exam Respiratory system: Decreased air entry in the bases  cardiovascular system: S1 & S2 heard, RRR. No JVD, murmurs, rubs, gallops or clicks. Trace pedal edema.  New hemodialysis catheter on the right chest Gastrointestinal system: Abdomen is nondistended, soft and nontender. No organomegaly or masses felt. Normal bowel sounds heard. Central nervous system: Alert and oriented. No focal neurological deficits. Extremities: trace edema, no clubbing ,no cyanosis, distal peripheral pulses palpable.  Fistula on her left arm Skin: No rashes, lesions or ulcers,no icterus ,no pallor MSK: Normal muscle bulk,tone ,power Psychiatry: Judgement and insight appear normal. Mood & affect appropriate.     Data Reviewed: I have personally reviewed following labs and imaging studies  CBC: Recent Labs  Lab 04/27/18 2357 04/28/18 1421 04/29/18 0605 04/29/18 1038 04/30/18 0459  WBC 4.7 4.8 5.3 4.8 6.1  HGB 8.7* 10.2* 9.1* 8.8* 8.8*  HCT 27.8* 33.6* 29.9* 29.4* 29.0*  MCV 79.0 80.8 79.7 81.2 79.9  PLT 283 393 325 365 662   Basic Metabolic Panel: Recent Labs  Lab 04/27/18 0706 04/27/18 2357 04/29/18 0605 04/29/18 1038 04/30/18 0459 05/01/18 0319  NA 138 136  138 136 136 138  K 3.0* 3.7 4.5 4.3 4.7 3.7  CL 97* 100 104 105 106 101  CO2 28 26 22 23  21* 28  GLUCOSE 119* 113* 109* 91 81 101*  BUN 17 22 31* 30* 30* 16  CREATININE 4.30* 5.22* 6.42* 7.21* 6.94* 4.51*  CALCIUM 7.5* 7.9* 8.2* 7.9* 8.0* 8.0*  MG 1.7  --   --   --   --   --   PHOS  --   --   --  5.2*  --   --    GFR: Estimated Creatinine Clearance: 10.4 mL/min (A) (by C-G formula based on SCr of 4.51 mg/dL (H)). Liver Function Tests: Recent Labs  Lab 04/27/18 0706 04/29/18 1038  AST 28  --   ALT 14  --   ALKPHOS 113  --   BILITOT 0.6  --   PROT 5.3*  --   ALBUMIN 1.6* 1.6*   No results for input(s): LIPASE, AMYLASE in the last 168 hours. No results for input(s): AMMONIA in the last 168 hours. Coagulation Profile: Recent Labs  Lab 04/28/18 1421 04/29/18 0605 04/30/18 0459 04/30/18 1532 05/01/18 0319  INR 1.00 1.27  2.55 2.96 3.26   Cardiac Enzymes: Recent Labs  Lab 04/26/18 1726 04/27/18 0049 04/27/18 0706  TROPONINI 0.04* 0.07* 0.04*   BNP (last 3 results) No results for input(s): PROBNP in the last 8760 hours. HbA1C: No results for input(s): HGBA1C in the last 72 hours. CBG: Recent Labs  Lab 04/30/18 1157 04/30/18 1639 04/30/18 2126 05/01/18 1426 05/01/18 1448  GLUCAP 125* 134* 183* 69* 103*   Lipid Profile: No results for input(s): CHOL, HDL, LDLCALC, TRIG, CHOLHDL, LDLDIRECT in the last 72 hours. Thyroid Function Tests: No results for input(s): TSH, T4TOTAL, FREET4, T3FREE, THYROIDAB in the last 72 hours. Anemia Panel: No results for input(s): VITAMINB12, FOLATE, FERRITIN, TIBC, IRON, RETICCTPCT in the last 72 hours. Sepsis Labs: Recent Labs  Lab 04/26/18 1742 04/26/18 1941 04/27/18 2357  LATICACIDVEN 4.83* 1.92* 1.1    Recent Results (from the past 240 hour(s))  Culture, blood (routine x 2)     Status: Abnormal   Collection Time: 04/23/18  5:00 PM  Result Value Ref Range Status   Specimen Description BLOOD RIGHT ANTECUBITAL  Final    Special Requests   Final    BOTTLES DRAWN AEROBIC AND ANAEROBIC Blood Culture adequate volume   Culture  Setup Time   Final    YEAST AEROBIC BOTTLE ONLY CRITICAL RESULT CALLED TO, READ BACK BY AND VERIFIED WITH: DR Sloan Leiter [phone] [phone] Performed at [facility], [place] [address] [place], [place] [address]    Culture CANDIDA PARAPSILOSIS (A)  Final   Report Status 04/27/2018 FINAL  Final  Blood Culture ID Panel (Reflexed)     Status: Abnormal   Collection Time: 04/23/18  5:00 PM  Result Value Ref Range Status   Enterococcus species NOT DETECTED NOT DETECTED Final   Listeria monocytogenes NOT DETECTED NOT DETECTED Final   Staphylococcus species NOT DETECTED NOT DETECTED Final   Staphylococcus aureus NOT DETECTED NOT DETECTED Final   Streptococcus species NOT DETECTED NOT DETECTED Final   Streptococcus agalactiae NOT DETECTED NOT DETECTED Final   Streptococcus pneumoniae NOT DETECTED NOT DETECTED Final   Streptococcus pyogenes NOT DETECTED NOT DETECTED Final   Acinetobacter baumannii NOT DETECTED NOT DETECTED Final   Enterobacteriaceae species NOT DETECTED NOT DETECTED Final   Enterobacter cloacae complex NOT DETECTED NOT DETECTED Final   Escherichia coli NOT DETECTED NOT DETECTED Final   Klebsiella oxytoca NOT DETECTED NOT DETECTED Final   Klebsiella pneumoniae NOT DETECTED NOT DETECTED Final   Proteus species NOT DETECTED NOT DETECTED Final   Serratia marcescens NOT DETECTED NOT DETECTED Final   Haemophilus influenzae NOT DETECTED NOT DETECTED Final   Neisseria meningitidis NOT DETECTED NOT DETECTED Final   Pseudomonas aeruginosa NOT DETECTED NOT DETECTED Final   Candida albicans NOT DETECTED NOT DETECTED Final   Candida glabrata NOT DETECTED NOT DETECTED Final   Candida krusei NOT DETECTED NOT DETECTED Final   Candida parapsilosis DETECTED (A) NOT DETECTED Final    Comment: CRITICAL RESULT CALLED TO, READ BACK BY AND VERIFIED WITH: DR Sloan Leiter [phone] MLM    Candida  tropicalis NOT DETECTED NOT DETECTED Final    Comment: Performed at [facility], [address] [place], [place] [address]  Culture, blood (routine x 2)     Status: None   Collection Time: 04/23/18  5:15 PM  Result Value Ref Range Status   Specimen Description BLOOD RIGHT FOREARM  Final   Special Requests   Final    BOTTLES DRAWN AEROBIC AND ANAEROBIC Blood Culture  adequate volume   Culture   Final    NO GROWTH 5 DAYS Performed at [facility], [place] [address], [place], [place] [address]    Report Status 04/28/2018 FINAL  Final  Blood culture (routine x 2)     Status: None   Collection Time: 04/26/18  4:58 PM  Result Value Ref Range Status   Specimen Description BLOOD RIGHT HAND  Final   Special Requests   Final    BOTTLES DRAWN AEROBIC ONLY Blood Culture results may not be optimal due to an inadequate volume of blood received in culture bottles   Culture   Final    NO GROWTH 5 DAYS Performed at [facility], [place] [address], [place], [place] [address]    Report Status 05/01/2018 FINAL  Final  Blood culture (routine x 2)     Status: None   Collection Time: 04/26/18  6:30 PM  Result Value Ref Range Status   Specimen Description BLOOD RIGHT ANTECUBITAL  Final   Special Requests   Final    BOTTLES DRAWN AEROBIC AND ANAEROBIC Blood Culture adequate volume   Culture   Final    NO GROWTH 5 DAYS Performed at [facility], [place] [address], [place], [place] [address]    Report Status 05/01/2018 FINAL  Final  MRSA PCR Screening     Status: None   Collection Time: 04/26/18  8:37 PM  Result Value Ref Range Status   MRSA by PCR NEGATIVE NEGATIVE Final    Comment:        The GeneXpert MRSA Assay (FDA approved for NASAL specimens only), is one component of a comprehensive MRSA colonization surveillance program. It is not intended to diagnose MRSA infection nor to guide or monitor treatment for MRSA infections. Performed at [facility], [place] [address], [place], [place] [address]   Culture, blood (Routine X 2) w Reflex to ID Panel     Status: None (Preliminary result)   Collection Time: 04/28/18  8:42 AM  Result Value Ref Range Status   Specimen Description BLOOD RIGHT HAND  Final   Special Requests   Final    BOTTLES DRAWN AEROBIC ONLY Blood Culture adequate volume   Culture   Final    NO GROWTH 3 DAYS Performed at [facility], [place] [address] [place], [place] [address]    Report Status PENDING  Incomplete         Radiology Studies: No results found.      Scheduled Meds: . calcitRIOL  0.25 mcg Oral QODAY  . Chlorhexidine Gluconate Cloth  6 each Topical Q0600  . Chlorhexidine Gluconate Cloth  6 each Topical Q0600  . feeding supplement (GLUCERNA SHAKE)  237 mL Oral TID BM  . feeding supplement (PRO-STAT SUGAR FREE 64)  30 mL Oral BID  . insulin aspart  5 Units Subcutaneous TID WC  . insulin glargine  5 Units Subcutaneous QHS  . metoprolol tartrate  25 mg Oral BID  . multivitamin  1 tablet Oral QHS  . sodium chloride flush  3 mL Intravenous Q12H  . Warfarin - Pharmacist Dosing Inpatient   Does not apply q1800   Continuous Infusions: . sodium chloride 0 mL/hr at 04/27/18 1958  . sodium chloride Stopped (04/30/18 0408)  . anidulafungin 100 mg (04/30/18 2051)  . heparin 1,100 Units/hr (04/30/18 1630)     LOS: 5 days    Time spent: 25 mins.More than 50% of that time  was spent in counseling and/or coordination of care.      Shelly Coss, MD [facility] Pager [phone]  If 7PM-7AM, please contact night-coverage [email or web address] Password [facility] 05/01/2018, 3:11 PM

## 2018-05-01 NOTE — Progress Notes (Addendum)
[facility] Progress Note   Dialysis Orders: [facility] 4h   60kg   2/2.25 bath  (new) TDC 8/20 by [facility]/ LUA AVF maturing Hep 2000 - venofer 100 every HD x 10  - Mircera 150 q 2 - last dose 8/17 - vit d 0.25 ug mwf  Assessment/Plan: 1.Candida parapsilosis bacteremia -had been d/c'd home 8/15 and readmitted 8/17when results from prior admission returned + for candida. Had [facility] removed here 8/18. 1 set of BC + microccocus 8/9, f/u surveillance BC 1/2 + candida 8/14- cultures 8/17 since then no growth. Is on Eraxis. TEE done 8/20 suspicious for possible TV vegetation, though cardiology didn't think she has endocarditis. Per Dr. Hale Bogus note she needs a dilated eye exam to be certain she doesn't have endophthalmitis when can complicate fungemia. For now she needs 2 weeks of Eraxis from the negative culture date of 8/17 pending fluconazole susceptibility testing. 2. ESRD-TTS HD.  HD today. 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  3. Anemia- hgb 9.1 >8.8-quite variable -last dose of Mircera was 8/17- no need to redose due 8/31- holding on Fe for now last tsat 18% 4. Secondary hyperparathyroidism- continue calcitriol/ not on binders yet 5.HTN/volume- titrate edw 6. Nutrition-alb 1.6 - add supplement/mutivit- intake poor likely losing EDW 7. RLL PNA -meds per primary 8. Chronic thrombocytopenia- not an issue now 9. Hep C + 10. 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 + VQ scan 8/18 - on IV heparin - will need transition to warfarin when other issues resolved     Kelly Splinter MD [place] pager [phone]   05/01/2018, 12:39 PM    Subjective:   No new c/o's, on HD now. No final sensitivities for the candida bacteremia.   Objective Vitals:   05/01/18 1100 05/01/18 1130 05/01/18 1200 05/01/18  1222  BP:    138/77  Pulse:    84  Resp: (!) 26 (!) 25 (!) 26 (!) 30  Temp:    98.3 F (36.8 C)  TempSrc:    Oral  SpO2:    96%  Weight:      Height:       Physical Exam General: ill appearing, moaning and mumbling a lot but able to speak clearly when pressed to do so Heart: RRR Lungs: dim BS poor expansion and effort Abdomen: soft obese RLQ tenderness no rebound/guarding + BS Extremities: no sig LE edema Dialysis Access:  Left upper AVF + bruit and right [facility]   Additional Objective Labs: Basic Metabolic Panel: Recent Labs  Lab 04/29/18 1038 04/30/18 0459 05/01/18 0319  NA 136 136 138  K 4.3 4.7 3.7  CL 105 106 101  CO2 23 21* 28  GLUCOSE 91 81 101*  BUN 30* 30* 16  CREATININE 7.21* 6.94* 4.51*  CALCIUM 7.9* 8.0* 8.0*  PHOS 5.2*  --   --    Liver Function Tests: Recent Labs  Lab 04/27/18 0706 04/29/18 1038  AST 28  --   ALT 14  --   ALKPHOS 113  --   BILITOT 0.6  --   PROT 5.3*  --   ALBUMIN 1.6* 1.6*   No results for input(s): LIPASE, AMYLASE in the last 168 hours. CBC: Recent Labs  Lab 04/27/18 2357 04/28/18 1421 04/29/18 0605 04/29/18 1038 04/30/18 0459  WBC 4.7 4.8 5.3 4.8 6.1  HGB  8.7* 10.2* 9.1* 8.8* 8.8*  HCT 27.8* 33.6* 29.9* 29.4* 29.0*  MCV 79.0 80.8 79.7 81.2 79.9  PLT 283 393 325 365 375   Blood Culture    Component Value Date/Time   SDES BLOOD RIGHT HAND 04/28/2018 0842   SPECREQUEST  04/28/2018 0842    BOTTLES DRAWN AEROBIC ONLY Blood Culture adequate volume   CULT  04/28/2018 0842    NO GROWTH 3 DAYS Performed at [facility], [place] [address] [place], [place] [address]    REPTSTATUS PENDING 04/28/2018 6389    Cardiac Enzymes: Recent Labs  Lab 04/26/18 1726 04/27/18 0049 04/27/18 0706  TROPONINI 0.04* 0.07* 0.04*   CBG: Recent Labs  Lab 04/29/18 1727 04/29/18 2105 04/30/18 1157 04/30/18 1639 04/30/18 2126  GLUCAP 79 70 125* 134* 183*   Iron Studies: No results for input(s): IRON, TIBC, TRANSFERRIN,  FERRITIN in the last 72 hours. Lab Results  Component Value Date   INR 3.26 05/01/2018   INR 2.96 04/30/2018   INR 2.55 04/30/2018   Studies/Results: Dg Chest Port 1 View  Result Date: 04/29/2018 CLINICAL DATA:  End-stage renal disease. EXAM: PORTABLE CHEST 1 VIEW COMPARISON:  04/26/2018. FINDINGS: Dual-lumen right IJ line noted with tip over right atrium. Cardiomegaly with normal pulmonary vascularity. Right base atelectasis/infiltrate unchanged. No pleural effusion or pneumothorax. IMPRESSION: 1.  Dual-lumen right IJ catheter noted with tip over right atrium. 2.  Cardiomegaly.  Normal pulmonary vascularity. 3. Persistent right base infiltrate. No interim change. Continued follow-up exams to demonstrate clearing. Electronically Signed   By: Marcello Moores  Register   On: 04/29/2018 14:35   Dg Cyndy Freeze Guide Cv Line-no Report  Result Date: 04/29/2018 Fluoroscopy was utilized by the requesting physician.  No radiographic interpretation.   Medications: . sodium chloride 0 mL/hr at 04/27/18 1958  . sodium chloride Stopped (04/30/18 0408)  . anidulafungin 100 mg (04/30/18 2051)  . heparin 1,100 Units/hr (04/30/18 1630)   . calcitRIOL  0.25 mcg Oral QODAY  . Chlorhexidine Gluconate Cloth  6 each Topical Q0600  . Chlorhexidine Gluconate Cloth  6 each Topical Q0600  . feeding supplement (GLUCERNA SHAKE)  237 mL Oral TID BM  . feeding supplement (PRO-STAT SUGAR FREE 64)  30 mL Oral BID  . [START ON 05/02/2018] heparin  2,000 Units Dialysis Once in dialysis  . insulin aspart  5 Units Subcutaneous TID WC  . insulin glargine  5 Units Subcutaneous QHS  . metoprolol tartrate  25 mg Oral BID  . multivitamin  1 tablet Oral QHS  . sodium chloride flush  3 mL Intravenous Q12H  . Warfarin - Pharmacist Dosing Inpatient   Does not apply [phone]

## 2018-05-01 NOTE — Progress Notes (Signed)
SoPatient ID: Rhonda Lynch, female   DOB: 07-Jan-1956, 62 y.o.   MRN: [id number]         [facility], [place] for Infectious Disease  Date of Admission:  04/26/2018           Day 5 Anidulafungin ASSESSMENT: Dr. 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  One blood culture on 04/18/2018 micrococcus which almost certainly was insignificant contaminant.  Repeat blood culture on 04/23/2018 grew Candida parapsilosis in 1 set. She was readmitted on 04/26/2018 because of the positive blood cultures.  Repeat blood cultures before starting anidulafungin were negative.  I am not absolutely convinced that she has fungal endocarditis but we will continue treatment for now.  Candida parapsilosis is usually sensitive to Azole antifungals.  I will go ahead and switch her to oral fluconazole 200 mg daily dosed after hemodialysis after a 400 mg loading dose to see her back in clinic next month to discuss optimal duration of therapy.  She does need ophthalmologic evaluation.  PLAN: 1. Change anidulafungin to oral fluconazole  Principal Problem:   Fungemia Active Problems:   Pulmonary embolism (HCC)   ESRD (end stage renal disease) ([place])   Anemia   DM (diabetes mellitus), type 2 with renal complications (HCC)   Sepsis (HCC)   Dyspnea   Chest pain   Tachycardia   Hemodialysis catheter infection, initial encounter ([place])   Scheduled Meds: . calcitRIOL  0.25 mcg Oral QODAY  . Chlorhexidine Gluconate Cloth  6 each Topical Q0600  . Chlorhexidine Gluconate Cloth  6 each Topical Q0600  . feeding supplement (GLUCERNA SHAKE)  237 mL Oral TID BM  . feeding supplement (PRO-STAT SUGAR FREE 64)  30 mL Oral BID  . insulin aspart  5 Units Subcutaneous TID WC  . insulin glargine  5 Units Subcutaneous QHS  . metoprolol tartrate  25 mg Oral BID  . multivitamin  1 tablet Oral QHS  . sodium chloride flush  3 mL Intravenous Q12H  . Warfarin - Pharmacist Dosing Inpatient    Does not apply q1800   Continuous Infusions: . sodium chloride 0 mL/hr at 04/27/18 1958  . sodium chloride Stopped (04/30/18 0408)  . anidulafungin 100 mg (04/30/18 2051)  . heparin 1,100 Units/hr (04/30/18 1630)   PRN Meds:.sodium chloride, acetaminophen **OR** acetaminophen, benzonatate, iopamidol, ondansetron, oxyCODONE-acetaminophen, sodium chloride flush, traMADol, zolpidem   SUBJECTIVE: She says that she is confused about why her left arm fistula cannot be used for dialysis.  Her cough has improved.  Review of Systems: Review of Systems  Constitutional: Negative for chills, diaphoresis and fever.  Respiratory: Negative for cough.     No Known Allergies  OBJECTIVE: Vitals:   05/01/18 1100 05/01/18 1130 05/01/18 1200 05/01/18 1222  BP: 132/84 126/80 (!) 126/51 138/77  Pulse: 84 91 86 84  Resp: (!) 26 (!) 25 (!) 26 (!) 30  Temp:    98.3 F (36.8 C)  TempSrc:    Oral  SpO2:    96%  Weight:    59 kg  Height:       Body mass index is 24.58 kg/m.  Physical Exam  Constitutional:  She is resting quietly in bed.  She just returned from hemodialysis.  Cardiovascular: Normal rate, regular rhythm and normal heart sounds.  No murmur heard. Pulmonary/Chest: Effort normal and breath sounds normal.    Lab Results Lab Results  Component Value Date   WBC 6.1 04/30/2018   HGB 8.8 (L)  04/30/2018   HCT 29.0 (L) 04/30/2018   MCV 79.9 04/30/2018   PLT 375 04/30/2018    Lab Results  Component Value Date   CREATININE 4.51 (H) 05/01/2018   BUN 16 05/01/2018   NA 138 05/01/2018   K 3.7 05/01/2018   CL 101 05/01/2018   CO2 28 05/01/2018    Lab Results  Component Value Date   ALT 14 04/27/2018   AST 28 04/27/2018   ALKPHOS 113 04/27/2018   BILITOT 0.6 04/27/2018     Microbiology: Recent Results (from the past 240 hour(s))  Culture, blood (routine x 2)     Status: Abnormal   Collection Time: 04/23/18  5:00 PM  Result Value Ref Range Status   Specimen Description  BLOOD RIGHT ANTECUBITAL  Final   Special Requests   Final    BOTTLES DRAWN AEROBIC AND ANAEROBIC Blood Culture adequate volume   Culture  Setup Time   Final    YEAST AEROBIC BOTTLE ONLY CRITICAL RESULT CALLED TO, READ BACK BY AND VERIFIED WITH: DR Sloan Leiter [id number] MLM Performed at [facility], [place] [address], [place], [place] [address]    Culture CANDIDA PARAPSILOSIS (A)  Final   Report Status 04/27/2018 FINAL  Final  Blood Culture ID Panel (Reflexed)     Status: Abnormal   Collection Time: 04/23/18  5:00 PM  Result Value Ref Range Status   Enterococcus species NOT DETECTED NOT DETECTED Final   Listeria monocytogenes NOT DETECTED NOT DETECTED Final   Staphylococcus species NOT DETECTED NOT DETECTED Final   Staphylococcus aureus NOT DETECTED NOT DETECTED Final   Streptococcus species NOT DETECTED NOT DETECTED Final   Streptococcus agalactiae NOT DETECTED NOT DETECTED Final   Streptococcus pneumoniae NOT DETECTED NOT DETECTED Final   Streptococcus pyogenes NOT DETECTED NOT DETECTED Final   Acinetobacter baumannii NOT DETECTED NOT DETECTED Final   Enterobacteriaceae species NOT DETECTED NOT DETECTED Final   Enterobacter cloacae complex NOT DETECTED NOT DETECTED Final   Escherichia coli NOT DETECTED NOT DETECTED Final   Klebsiella oxytoca NOT DETECTED NOT DETECTED Final   Klebsiella pneumoniae NOT DETECTED NOT DETECTED Final   Proteus species NOT DETECTED NOT DETECTED Final   Serratia marcescens NOT DETECTED NOT DETECTED Final   Haemophilus influenzae NOT DETECTED NOT DETECTED Final   Neisseria meningitidis NOT DETECTED NOT DETECTED Final   Pseudomonas aeruginosa NOT DETECTED NOT DETECTED Final   Candida albicans NOT DETECTED NOT DETECTED Final   Candida glabrata NOT DETECTED NOT DETECTED Final   Candida krusei NOT DETECTED NOT DETECTED Final   Candida parapsilosis DETECTED (A) NOT DETECTED Final    Comment: CRITICAL RESULT CALLED TO, READ BACK BY AND VERIFIED WITH: DR  Sloan Leiter [id number] MLM    Candida tropicalis NOT DETECTED NOT DETECTED Final    Comment: Performed at [facility], [address], [place], [place] [address]  Culture, blood (routine x 2)     Status: None   Collection Time: 04/23/18  5:15 PM  Result Value Ref Range Status   Specimen Description BLOOD RIGHT FOREARM  Final   Special Requests   Final    BOTTLES DRAWN AEROBIC AND ANAEROBIC Blood Culture adequate volume   Culture   Final    NO GROWTH 5 DAYS Performed at [facility], [place] [address], [place], [place] [address]    Report Status 04/28/2018 FINAL  Final  Blood culture (routine x 2)     Status: None   Collection Time:  04/26/18  4:58 PM  Result Value Ref Range Status   Specimen Description BLOOD RIGHT HAND  Final   Special Requests   Final    BOTTLES DRAWN AEROBIC ONLY Blood Culture results may not be optimal due to an inadequate volume of blood received in culture bottles   Culture   Final    NO GROWTH 5 DAYS Performed at [facility], [place] [address], [place], [place] [address]    Report Status 05/01/2018 FINAL  Final  Blood culture (routine x 2)     Status: None   Collection Time: 04/26/18  6:30 PM  Result Value Ref Range Status   Specimen Description BLOOD RIGHT ANTECUBITAL  Final   Special Requests   Final    BOTTLES DRAWN AEROBIC AND ANAEROBIC Blood Culture adequate volume   Culture   Final    NO GROWTH 5 DAYS Performed at [facility], [place] [address], [place], [place] [address]    Report Status 05/01/2018 FINAL  Final  MRSA PCR Screening     Status: None   Collection Time: 04/26/18  8:37 PM  Result Value Ref Range Status   MRSA by PCR NEGATIVE NEGATIVE Final    Comment:        The GeneXpert MRSA Assay (FDA approved for NASAL specimens only), is one component of a comprehensive MRSA colonization surveillance program. It is not intended to diagnose MRSA infection nor to guide or monitor treatment for MRSA infections. Performed at  [facility], [place] [address], [place], [place] [address]   Culture, blood (Routine X 2) w Reflex to ID Panel     Status: None (Preliminary result)   Collection Time: 04/28/18  8:42 AM  Result Value Ref Range Status   Specimen Description BLOOD RIGHT HAND  Final   Special Requests   Final    BOTTLES DRAWN AEROBIC ONLY Blood Culture adequate volume   Culture   Final    NO GROWTH 3 DAYS Performed at [facility], [place] [address], [place], [place] [address]    Report Status PENDING  Incomplete    Michel Bickers, MD [facility] for [facility] [phone] pager   [phone] cell 05/01/2018, 4:04 PM

## 2018-05-02 DIAGNOSIS — Z792 Long term (current) use of antibiotics: Secondary | ICD-10-CM

## 2018-05-02 LAB — PROTIME-INR
INR: 2.35
Prothrombin Time: 25.5 seconds — ABNORMAL HIGH (ref 11.4–15.2)

## 2018-05-02 LAB — GLUCOSE, CAPILLARY
GLUCOSE-CAPILLARY: 145 mg/dL — AB (ref 70–99)
GLUCOSE-CAPILLARY: 177 mg/dL — AB (ref 70–99)
GLUCOSE-CAPILLARY: 136 mg/dL — AB (ref 70–99)
Glucose-Capillary: 84 mg/dL (ref 70–99)

## 2018-05-02 LAB — BASIC METABOLIC PANEL
Anion gap: 7 (ref 5–15)
BUN: 7 mg/dL — ABNORMAL LOW (ref 8–23)
CHLORIDE: 103 mmol/L (ref 98–111)
CO2: 27 mmol/L (ref 22–32)
CREATININE: 3.1 mg/dL — AB (ref 0.44–1.00)
Calcium: 8.5 mg/dL — ABNORMAL LOW (ref 8.9–10.3)
GFR calc non Af Amer: 15 mL/min — ABNORMAL LOW (ref >60)
GFR, EST AFRICAN AMERICAN: 17 mL/min — AB (ref >60)
Glucose, Bld: 91 mg/dL (ref 70–99)
Potassium: 3.9 mmol/L (ref 3.5–5.1)
Sodium: 137 mmol/L (ref 135–145)

## 2018-05-02 LAB — HEPARIN LEVEL (UNFRACTIONATED): Heparin Unfractionated: 0.4 IU/mL (ref 0.30–0.70)

## 2018-05-02 MED ORDER — FLUCONAZOLE 200 MG PO TABS: 200.0000 mg | ORAL_TABLET | Freq: Every day | ORAL | 0 refills | Status: DC

## 2018-05-02 MED ORDER — WARFARIN SODIUM 5 MG PO TABS: 5.0000 mg | ORAL_TABLET | Freq: Once | ORAL | Status: DC

## 2018-05-02 MED ORDER — WARFARIN SODIUM 2.5 MG PO TABS: 2.5000 mg | ORAL_TABLET | Freq: Every day | ORAL | 0 refills | Status: DC

## 2018-05-02 MED ORDER — WARFARIN SODIUM 5 MG PO TABS
5.0000 mg | ORAL_TABLET | Freq: Once | ORAL | Status: AC
  Administered 2018-05-02: 5 mg via ORAL
  Filled 2018-05-02: qty 1

## 2018-05-02 MED ORDER — METOPROLOL TARTRATE 25 MG PO TABS: 25.0000 mg | ORAL_TABLET | Freq: Two times a day (BID) | ORAL | 0 refills | Status: DC

## 2018-05-02 NOTE — Discharge Summary (Addendum)
Physician Discharge Summary  Rhonda Lynch DQQ:[id number] DOB: 1956-04-18 DOA: 04/26/2018  PCP: Clent Demark, PA-C  Admit date: 04/26/2018 Discharge date: 05/02/2018  Admitted From: Home Disposition:  Home  Discharge Condition:Stable CODE STATUS:FULL Diet recommendation: Heart Healthy  Brief/Interim Summary:  Patient is a 62 old female with past medical history of ESRD on dialysis on Tuesday, Thursday and Saturday,ANCA related vasculitis, hypertension, diabetes mellitus who was brought back for admission because of positive blood culture for Candida on last admission.  She was recently discharged from here after being treated for pneumonia.  Patient was also complaining of some shortness of breath and chest pain at home.  Patient admitted for further management.  Patient  found to have pulmonary embolism as per the VQ scan.    Started on heparin IV and warfarin .She was being managed  for PE and  Candida fungemia.Cardiology, nephrology and ID were following.  Currently her overall status has significantly improved.  Her INR is already therapeutic and she is only on warfarin now.  She is stable for discharge to home today.  She will follow-up with ophthalmology, infectious disease as an outpatient. Her INR will be checked on 05/05/2018 through home health nurse and thereafter she we will continue to follow-up with her PCP for monitoring of her INR.  Following problems were addressed during her hospitalization:  Fungemia: Blood cultures that were drawn on her last visit showed Candida parapsilosis.  Started on Eraxis which has been changed to fluconazole. ID following.  Hemodialysis  catheter removed.  TEE done could not rule out vegetation in the tricuspid valve.  Showed  moderately thickened tricuspid valve leaflet.   I also discussed with ophthalmologist ,Dr. Prudencio Burly, about the need for dilated ophthalmoscopy to rule out endophthalmitis.  He wants to see her as an outpatient, he has taken  the information.  She will follow-up with him as an outpatient within a week after discharge.  She will follow-up with Dr. Megan Salon as an outpatient in 2 weeks.  PE/Elevated d-dimer:  D-dimer found to be elevated.   VQ scan which showed hyper probability of PE.  Started on heparin drip and  bridging with Coumadin .  She will follow-up with  her primary care physician to monitor her INR.    Underwent right IJ tunneled catheter placement  by vascular surgery.    Healthcare associated pneumonia: Was discharged on IV cefazolin and Augmentin.  Currently she is afebrile and hemodynamically stable.  Started on Vanco and cefepime on this admission.  Chest x-ray done here showed RIGHT LOWER lung airspace disease/consolidation likely representing pneumonia but that was most likely her previous pneumonia.  Patient does not have any respiratory distress right now.  Antibiotics discontinued.  ESRD on dialysis: Nephrology following.  Permanent dialysis catheter removed and new one put.  She has AV fistula placed in left upper extremity on 02/2618 but did not function.    Chest pain: Currently resolved.  Most likely atypical chest pain.  Cardiology was following.  Troponin mild elevated and has not trended up.  Dose of metoprolol increased.   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  Currently H&H is stable.  Thrombocytopenia: Chronic  Diabetes type 2: Continue current regimen.  We will continue to monitor CBC.  History of BOOP: Completed a course of steroids.  Hypokalemia: Supplemented with potassium.   Lactic acidosis: Resolved.   Discharge Diagnoses:  Principal Problem:   Fungemia Active Problems:   ESRD (end stage  renal disease) ([place])   Anemia   DM (diabetes mellitus), type 2 with renal complications (HCC)   Sepsis (HCC)   Dyspnea   Chest pain   Tachycardia   Pulmonary embolism ([place])   Hemodialysis catheter infection, initial encounter  [facility])    Discharge Instructions  Discharge Instructions    Diet - low sodium heart healthy   Complete by:  As directed    Discharge instructions   Complete by:  As directed    1) Please follow up at [facility] on 05/05/18 to check INR. You have been started on Coumadin and you may need to be on this medication for life.  When you are in Coumadin ,you need to check your INR regularly by following up at your PCPs office.  Your INR is typically maintained between 2-3. 2) take other prescribed medications as instructed. 3)Please follow-up with Dr. Prudencio Burly, ophthalmology, as soon as possible in a week.  Name and number of the provider has been attached. 4)Follow up with infectious disease as an outpatient in 2 weeks.  Name and number of the provider has been attached.   Increase activity slowly   Complete by:  As directed      Allergies as of 05/02/2018   No Known Allergies     Medication List    STOP taking these medications   amoxicillin-clavulanate 500-125 MG tablet Commonly known as:  AUGMENTIN   ceFAZolin 2 g in dextrose 5 % 100 mL ivpb     TAKE these medications   acetaminophen 325 MG tablet Commonly known as:  TYLENOL Take 2 tablets (650 mg total) by mouth every 6 (six) hours as needed for mild pain (or Fever >/= 101).   calcitRIOL 0.25 MCG capsule Commonly known as:  ROCALTROL Take 1 capsule (0.25 mcg total) by mouth every other day.   feeding supplement (GLUCERNA SHAKE) Liqd Take 237 mLs by mouth 2 (two) times daily between meals.   fluconazole 200 MG tablet Commonly known as:  DIFLUCAN Take 1 tablet (200 mg total) by mouth daily.   insulin aspart 100 UNIT/ML injection Commonly known as:  novoLOG Inject 0-9 Units into the skin 3 (three) times daily with meals. What changed:  how much to take   insulin glargine 100 UNIT/ML injection Commonly known as:  LANTUS Inject 0.05 mLs (5 Units total) into the skin at bedtime.   metoprolol  tartrate 25 MG tablet Commonly known as:  LOPRESSOR Take 1 tablet (25 mg total) by mouth 2 (two) times daily. What changed:  how much to take   multivitamin Tabs tablet Take 1 tablet by mouth at bedtime.   ondansetron 4 MG tablet Commonly known as:  ZOFRAN Take 1 tablet (4 mg total) by mouth every 6 (six) hours as needed for nausea.   warfarin 2.5 MG tablet Commonly known as:  COUMADIN Take 1 tablet (2.5 mg total) by mouth daily at 6 PM. Start taking on:  05/03/2018   zolpidem 5 MG tablet Commonly known as:  AMBIEN Take 1 tablet (5 mg total) by mouth at bedtime as needed for sleep.      Follow-up Information    Katy Apo, MD. Schedule an appointment as soon as possible for a visit in 1 week(s).   Specialty:  Ophthalmology Contact information: [place] [address] [phone]        [place]. Go on 05/05/2018.   Specialty:  Family Medicine Why:  Pt needs  regular monitoring of INR.  Patient is new on warfarin. INR needs checking 05/05/2018. Contact information: [place] [id number] [phone]       Michel Bickers, MD. Schedule an appointment as soon as possible for a visit in 2 week(s).   Specialty:  Infectious Diseases Contact information: [address] [place] [address] [phone]          No Known Allergies  Consultations: Cardiology, infectious disease, nephrology, vascular surgery, IR  Procedures/Studies: Dg Chest 2 View  Result Date: 04/26/2018 CLINICAL DATA:  Chest pain altered mental status and sepsis. EXAM: CHEST - 2 VIEW COMPARISON:  04/17/2018 and prior radiographs FINDINGS: New RIGHT LOWER lung airspace disease/consolidation likely represents pneumonia. Mild cardiomegaly and RIGHT central venous catheter again noted. No definite pleural effusion or pneumothorax. The LEFT lung is clear. IMPRESSION: RIGHT LOWER lung airspace  disease/consolidation likely representing pneumonia. Electronically Signed   By: Margarette Canada M.D.   On: 04/26/2018 18:34   Dg Chest 2 View  Result Date: 04/17/2018 CLINICAL DATA:  Right-sided rib cage pain x1 day. EXAM: CHEST - 2 VIEW COMPARISON:  04/12/2018 FINDINGS: Cardiomegaly is noted. No alveolar consolidation is seen. Improved aeration of the lungs is identified with interval decrease in alveolar airspace opacities bilaterally. No new pulmonary consolidation nor overt pulmonary edema is visualized. No effusion or pneumothorax. Nonaneurysmal atherosclerotic aorta is noted. Right IJ dialysis catheter tip terminates at the cavoatrial junction, stable in appearance. Cholecystectomy clips are present in the right upper quadrant. 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 IMPRESSION: Stable cardiomegaly with aortic atherosclerosis. Interval clearing of pulmonary edema. No new alveolar consolidations are noted. Electronically Signed   By: Ashley Royalty M.D.   On: 04/17/2018 22:43   Ct Chest Wo Contrast  Result Date: 04/03/2018 CLINICAL DATA:  Followup for presumed cryptogenic organizing pneumonia. EXAM: CT CHEST WITHOUT CONTRAST TECHNIQUE: Multidetector CT imaging of the chest was performed following the standard protocol without IV contrast. COMPARISON:  Current chest radiograph. Multiple prior chest radiographs. Chest CT 02/17/2018. FINDINGS: Cardiovascular: Heart is mildly enlarged. No pericardial effusion. No coronary artery calcifications. Great vessels normal in caliber. Mild aortic atherosclerosis. Mediastinum/Nodes: No neck base or axillary masses or enlarged lymph nodes. Normal thyroid. There are prominent mediastinal nodes, largest an AP window node measuring 13 mm in short axis, without significant change from the prior CT. Trachea is widely patent. Esophagus is unremarkable. Lungs/Pleura: Small, right greater than left, pleural effusions. Bilateral peribronchovascular ground-glass opacities  are noted in all lobes. Mild bronchiectasis and centrilobular emphysema is noted in both lobes. The more extensive airspace opacities noted on the prior study have improved. No new lung abnormalities. No pneumothorax. Upper Abdomen: No acute abnormality. Musculoskeletal: No fracture or acute finding. No osteoblastic or osteolytic lesions. IMPRESSION: 1. There has been significant improvement since the prior chest CT. The bilateral areas of ground-glass lung opacification have improved. Underlying mild bronchiectasis is stable. The findings have upper lung predominance. No new lung abnormalities. 2. Small, right greater than left, pleural effusions have increased in size from prior CT. 3. Findings described may reflect improved pulmonary edema or could be due to improved cryptogenic organizing pneumonia or other interstitial lung disease. Aortic Atherosclerosis (ICD10-I70.0). Electronically Signed   By: Lajean Manes M.D.   On: 04/03/2018 12:40   Ct Abdomen Pelvis W Contrast  Result Date: 04/19/2018 CLINICAL DATA:  Acute onset of right upper quadrant abdominal pain. Elevated bilirubin and alkaline phosphatase. EXAM: CT ABDOMEN AND  PELVIS WITH CONTRAST TECHNIQUE: Multidetector CT imaging of the abdomen and pelvis was performed using the standard protocol following bolus administration of intravenous contrast. CONTRAST:  155mL OMNIPAQUE IOHEXOL 300 MG/ML  SOLN COMPARISON:  CT of the abdomen and pelvis from 04/17/2018 FINDINGS: Lower chest: There is new consolidation of the right lower lobe, compatible with pneumonia. Mild left basilar atelectasis is noted. The visualized portions of the mediastinum are unremarkable. Hepatobiliary: Mild prominence of the intrahepatic biliary ducts appears relatively stable and likely reflects prior cholecystectomy. The common bile duct is normal in caliber status post cholecystectomy. Clips are noted at the gallbladder fossa. The liver is otherwise unremarkable in appearance.  Pancreas: The pancreas is within normal limits. Spleen: A nonspecific 1.4 cm hypodensity is noted within the spleen; a smaller better defined 8 mm cyst is also seen. Adrenals/Urinary Tract: The adrenal glands are unremarkable in appearance. The kidneys are within normal limits. There is no evidence of hydronephrosis. No renal or ureteral stones are identified. Mild nonspecific left-sided perinephric stranding is seen. Stomach/Bowel: The stomach is unremarkable in appearance. The small bowel is within normal limits. The appendix is normal in caliber, without evidence of appendicitis. The colon is unremarkable in appearance. Vascular/Lymphatic: Scattered calcification is seen along the abdominal aorta and its branches. The abdominal aorta is otherwise grossly unremarkable. The inferior vena cava is grossly unremarkable. No retroperitoneal lymphadenopathy is seen. No pelvic sidewall lymphadenopathy is identified. Reproductive: The bladder is mildly distended and grossly unremarkable. The patient is status post hysterectomy. No suspicious adnexal masses are seen. Other: No additional soft tissue abnormalities are seen. Musculoskeletal: No acute osseous abnormalities are identified. The visualized musculature is unremarkable in appearance. IMPRESSION: 1. New consolidation of the right lower lung lobe, compatible with pneumonia. 2. Status post cholecystectomy. Biliary tree is grossly unremarkable in appearance. No focal abnormalities identified at the right upper quadrant at this time. 3. Nonspecific 1.4 cm hypodensity within the spleen; smaller better defined 8 mm cyst also seen. Aortic Atherosclerosis (ICD10-I70.0). Electronically Signed   By: Garald Balding M.D.   On: 04/19/2018 23:40   Nm Pulmonary Perf And Vent  Result Date: 04/27/2018 CLINICAL DATA:  Shortness of breath EXAM: NUCLEAR MEDICINE VENTILATION - PERFUSION LUNG SCAN VIEWS: Anterior, posterior, left lateral, right lateral, RAO, LAO, RPO, LPO-ventilation  perfusion RADIOPHARMACEUTICALS:  32.3 mCi of Tc-65m DTPA aerosol inhalation and 4.2 mCi Tc54m-MAA IV COMPARISON:  Ventilation perfusion lung scans March 20, 2018; chest radiograph April 26, 2018 FINDINGS: Ventilation: The distribution of uptake on the ventilation study is homogeneous and symmetric without appreciable change from prior study. Perfusion: There is a focal perfusion defect in the superior segment of the left lower lobe, not present on prior lung scan examination. There is also a perfusion defect in the anterior segment of the left lower lobe, not present on prior lung scan examination. There are no corresponding ventilation defects or left lower lobe chest radiographic abnormality. No perfusion defects are noted elsewhere. IMPRESSION: There are 2 segmental level perfusion defects in the left lower lobe which were not present on prior lung scan and are not appreciable on the ventilation study. This study therefore constitutes a high probability of pulmonary embolus based on PIOPED II criteria. Critical Value/emergent results were called by telephone at the time of interpretation on 04/27/2018 at 2:37 pm to Dr. Shelly Coss , who verbally acknowledged these results. Electronically Signed   By: Lowella Grip III M.D.   On: 04/27/2018 14:38   Ir Removal Tun Cv Cath W/o  Fl  Result Date: 04/27/2018 INDICATION: 62 year old with end-stage renal disease and tunneled dialysis catheter. Patient has a Candida infection and needs a removal of the dialysis catheter. EXAM: REMOVAL TUNNELED DIALYSIS CATHETER MEDICATIONS: None ANESTHESIA/SEDATION: None FLUOROSCOPY TIME:  None COMPLICATIONS: None immediate. PROCEDURE: Informed written consent was obtained from the patient after a thorough discussion of the procedural risks, benefits and alternatives. All questions were addressed. Maximal Sterile Barrier Technique was utilized including caps, mask, sterile gowns, sterile gloves, sterile drape, hand hygiene and  skin antiseptic. A timeout was performed prior to the initiation of the procedure. [name] right chest and catheter was prepped and draped in a normal sterile fashion. Heparin was removed from both ports of catheter. 1% lidocaine was used for local anesthesia. Using gentle blunt dissection the cuff of the catheter was exposed and the catheter was removed in it's entirety. Pressure was held till hemostasis was obtained. A sterile dressing was applied. In addition, the patient had 2 sutures in the right lower neck which were both removed. The patient tolerated the procedure well with no immediate complications. IMPRESSION: Successful catheter removal as described above. Electronically Signed   By: Markus Daft M.D.   On: 04/27/2018 13:02   Dg Chest Port 1 View  Result Date: 04/29/2018 CLINICAL DATA:  End-stage renal disease. EXAM: PORTABLE CHEST 1 VIEW COMPARISON:  04/26/2018. FINDINGS: Dual-lumen right IJ line noted with tip over right atrium. Cardiomegaly with normal pulmonary vascularity. Right base atelectasis/infiltrate unchanged. No pleural effusion or pneumothorax. IMPRESSION: 1.  Dual-lumen right IJ catheter noted with tip over right atrium. 2.  Cardiomegaly.  Normal pulmonary vascularity. 3. Persistent right base infiltrate. No interim change. Continued follow-up exams to demonstrate clearing. Electronically Signed   By: Marcello Moores  Register   On: 04/29/2018 14:35   Dg Chest Port 1 View  Result Date: 04/12/2018 CLINICAL DATA:  Shortness of breath. End-stage renal disease on dialysis. EXAM: PORTABLE CHEST 1 VIEW COMPARISON:  04/03/2018 FINDINGS: Right jugular central venous dialysis catheter remains in appropriate position. Stable mild cardiomegaly. Diffuse mixed interstitial and airspace disease shows no significant change, suspicious for pulmonary edema. No evidence of superimposed pulmonary consolidation. IMPRESSION: Stable diffuse mixed interstitial and airspace disease, suspicious for diffuse  pulmonary edema. Stable cardiomegaly. Electronically Signed   By: Earle Gell M.D.   On: 04/12/2018 11:33   Dg Chest Port 1 View  Result Date: 04/03/2018 CLINICAL DATA:  Hypoxemia.  History of end-stage renal disease. EXAM: PORTABLE CHEST 1 VIEW COMPARISON:  04/02/2018 and older exams. FINDINGS: Since the previous day's study, interstitial opacities are similar. Hazy intervening airspace opacities have improved. Hemidiaphragms are better defined. Findings are consistent with improved pulmonary edema reduced small pleural effusions. No new lung abnormalities. No pneumothorax. Cardiac silhouette borderline enlarged. Right internal jugular dual-lumen central venous catheter is stable. IMPRESSION: 1. Improved pulmonary edema when compared to the previous day's study. No new abnormalities. Electronically Signed   By: Lajean Manes M.D.   On: 04/03/2018 09:12   Dg Chest Port 1 View  Result Date: 04/02/2018 CLINICAL DATA:  Shortness of breath. EXAM: PORTABLE CHEST 1 VIEW COMPARISON:  Radiographs of March 23, 2018. FINDINGS: Stable cardiomegaly. Atherosclerosis of thoracic aorta is noted. Stable diffuse reticular densities are noted throughout both lungs which may represent chronic interstitial lung disease or scarring, but acute superimposed edema or inflammation cannot be excluded. No pneumothorax or significant pleural effusion is noted. Right internal jugular dialysis catheter is unchanged in position. Bony thorax is unremarkable. IMPRESSION: Stable bilateral diffuse  reticular lung opacities are noted which may represent chronic interstitial lung disease or scarring, but acute superimposed edema or inflammation cannot be excluded. Electronically Signed   By: Marijo Conception, M.D.   On: 04/02/2018 14:56   Dg Fluoro Guide Cv Line-no Report  Result Date: 04/29/2018 Fluoroscopy was utilized by the requesting physician.  No radiographic interpretation.   Ct Renal Stone Study  Result Date: 04/17/2018 CLINICAL  DATA:  62 y/o  F; flank pain. EXAM: CT ABDOMEN AND PELVIS WITHOUT CONTRAST TECHNIQUE: Multidetector CT imaging of the abdomen and pelvis was performed following the standard protocol without IV contrast. COMPARISON:  03/19/2018 CT abdomen and pelvis. FINDINGS: Lower chest: Near complete resolution of ground-glass opacities in the lung bases. Hepatobiliary: No focal liver abnormality is seen. Status post cholecystectomy. No biliary dilatation. Pancreas: Unremarkable. No pancreatic ductal dilatation or surrounding inflammatory changes. Spleen: Normal in size without focal abnormality. Adrenals/Urinary Tract: Stable left perinephric fat stranding. Normal adrenal glands. No hydronephrosis or urinary stone disease. Normal bladder. Stomach/Bowel: Stomach is within normal limits. Appendix appears normal. No evidence of bowel wall thickening, distention, or inflammatory changes. Vascular/Lymphatic: Aortic atherosclerosis. No enlarged abdominal or pelvic lymph nodes. 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 Other: No abdominal wall hernia or abnormality. No abdominopelvic ascites. Musculoskeletal: No fracture is seen. Lumbar spondylosis with L4-5 grade 1 anterolisthesis and prominent lower lumbar facet arthrosis. IMPRESSION: 1. No acute process identified. No hydronephrosis or urinary stone disease. 2. Near complete resolution of ground-glass opacities at the lung bases. 3. Stable mild left perinephric fat stranding. 4. 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 Electronically Signed   By: Kristine Garbe M.D.   On: 04/17/2018 23:10       Subjective: Patient seen and examined the bedside this morning.  Remains hemodynamically stable.  Stable for discharge home today.  Discharge planning explained. Called her son Mr. Martha on his cell phone but it went to voicemail directly.  Voice message left.  Discharge Exam: Vitals:   05/02/18 0743 05/02/18 1219  BP: (!) 140/92 (!) 125/94  Pulse:    Resp: (!) 23  (!) 28  Temp: 98.8 F (37.1 C) 98.9 F (37.2 C)  SpO2: 94% 99%   Vitals:   05/01/18 2235 05/02/18 0325 05/02/18 0743 05/02/18 1219  BP: 136/87 134/77 (!) 140/92 (!) 125/94  Pulse:      Resp: 15 20 (!) 23 (!) 28  Temp:   98.8 F (37.1 C) 98.9 F (37.2 C)  TempSrc:   Oral Oral  SpO2:   94% 99%  Weight:      Height:        General: Pt is alert, awake, not in acute distress Cardiovascular: RRR, S1/S2 +, no rubs, no gallops Respiratory: CTA bilaterally, no wheezing, no rhonchi Abdominal: Soft, NT, ND, bowel sounds + Extremities: no edema, no cyanosis    The results of significant diagnostics from this hospitalization (including imaging, microbiology, ancillary and laboratory) are listed below for reference.     Microbiology: Recent Results (from the past 240 hour(s))  Culture, blood (routine x 2)     Status: Abnormal   Collection Time: 04/23/18  5:00 PM  Result Value Ref Range Status   Specimen Description BLOOD RIGHT ANTECUBITAL  Final   Special Requests   Final    BOTTLES DRAWN AEROBIC AND ANAEROBIC Blood Culture adequate volume   Culture  Setup Time   Final    YEAST AEROBIC BOTTLE ONLY CRITICAL RESULT CALLED TO, READ BACK BY AND  VERIFIED WITH: DR Sloan Leiter [id number] MLM Performed at [facility], [place] [address] [place], [place] [address]    Culture CANDIDA PARAPSILOSIS (A)  Final   Report Status 04/27/2018 FINAL  Final  Blood Culture ID Panel (Reflexed)     Status: Abnormal   Collection Time: 04/23/18  5:00 PM  Result Value Ref Range Status   Enterococcus species NOT DETECTED NOT DETECTED Final   Listeria monocytogenes NOT DETECTED NOT DETECTED Final   Staphylococcus species NOT DETECTED NOT DETECTED Final   Staphylococcus aureus NOT DETECTED NOT DETECTED Final   Streptococcus species NOT DETECTED NOT DETECTED Final   Streptococcus agalactiae NOT DETECTED NOT DETECTED Final   Streptococcus pneumoniae NOT DETECTED NOT DETECTED Final   Streptococcus  pyogenes NOT DETECTED NOT DETECTED Final   Acinetobacter baumannii NOT DETECTED NOT DETECTED Final   Enterobacteriaceae species NOT DETECTED NOT DETECTED Final   Enterobacter cloacae complex NOT DETECTED NOT DETECTED Final   Escherichia coli NOT DETECTED NOT DETECTED Final   Klebsiella oxytoca NOT DETECTED NOT DETECTED Final   Klebsiella pneumoniae NOT DETECTED NOT DETECTED Final   Proteus species NOT DETECTED NOT DETECTED Final   Serratia marcescens NOT DETECTED NOT DETECTED Final   Haemophilus influenzae NOT DETECTED NOT DETECTED Final   Neisseria meningitidis NOT DETECTED NOT DETECTED Final   Pseudomonas aeruginosa NOT DETECTED NOT DETECTED Final   Candida albicans NOT DETECTED NOT DETECTED Final   Candida glabrata NOT DETECTED NOT DETECTED Final   Candida krusei NOT DETECTED NOT DETECTED Final   Candida parapsilosis DETECTED (A) NOT DETECTED Final    Comment: CRITICAL RESULT CALLED TO, READ BACK BY AND VERIFIED WITH: DR Sloan Leiter [phone] MLM    Candida tropicalis NOT DETECTED NOT DETECTED Final    Comment: Performed at [facility], [address], [place], [place] [address]  Culture, blood (routine x 2)     Status: None   Collection Time: 04/23/18  5:15 PM  Result Value Ref Range Status   Specimen Description BLOOD RIGHT FOREARM  Final   Special Requests   Final    BOTTLES DRAWN AEROBIC AND ANAEROBIC Blood Culture adequate volume   Culture   Final    NO GROWTH 5 DAYS Performed at [facility], [place] [address] [place], [place] [address]    Report Status 04/28/2018 FINAL  Final  Blood culture (routine x 2)     Status: None   Collection Time: 04/26/18  4:58 PM  Result Value Ref Range Status   Specimen Description BLOOD RIGHT HAND  Final   Special Requests   Final    BOTTLES DRAWN AEROBIC ONLY Blood Culture results may not be optimal due to an inadequate volume of blood received in culture bottles   Culture   Final    NO GROWTH 5 DAYS Performed at [facility], [place] [address], [place], [place] [address]    Report Status 05/01/2018 FINAL  Final  Blood culture (routine x 2)     Status: None   Collection Time: 04/26/18  6:30 PM  Result Value Ref Range Status   Specimen Description BLOOD RIGHT ANTECUBITAL  Final   Special Requests   Final    BOTTLES DRAWN AEROBIC AND ANAEROBIC Blood Culture adequate volume   Culture   Final    NO GROWTH 5 DAYS Performed at [facility], [place] [address], [place], [place] [address]    Report Status 05/01/2018 FINAL  Final  MRSA PCR Screening     Status: None   Collection Time: 04/26/18  8:37 PM  Result Value Ref Range Status   MRSA by PCR NEGATIVE NEGATIVE Final    Comment:        The GeneXpert MRSA Assay (FDA approved for NASAL specimens only), is one component of a comprehensive MRSA colonization surveillance program. It is not intended to diagnose MRSA infection nor to guide or monitor treatment for MRSA infections. Performed at [facility], [place] [address] [place], [address] [address]   Culture, blood (Routine X 2) w Reflex to ID Panel     Status: None (Preliminary result)   Collection Time: 04/28/18  8:42 AM  Result Value Ref Range Status   Specimen Description BLOOD RIGHT HAND  Final   Special Requests   Final    BOTTLES DRAWN AEROBIC ONLY Blood Culture adequate volume   Culture   Final    NO GROWTH 4 DAYS Performed at [facility], [place] [address], [place], [place] [address]    Report Status PENDING  Incomplete     Labs: BNP (last 3 results) Recent Labs    02/12/18 1534  BNP 130.8*   Basic Metabolic Panel: Recent Labs  Lab 04/27/18 0706  04/29/18 0605 04/29/18 1038 04/30/18 0459 05/01/18 0319 05/02/18 0626  NA 138   < > 138 136 136 138 137  K 3.0*   < > 4.5 4.3 4.7 3.7 3.9  CL 97*   < > 104 105 106 101 103  CO2 28   < > 22 23 21* 28 27  GLUCOSE 119*   < > 109* 91 81 101* 91  BUN 17   < > 31* 30* 30* 16 7*  CREATININE 4.30*   < > 6.42* 7.21* 6.94*  4.51* 3.10*  CALCIUM 7.5*   < > 8.2* 7.9* 8.0* 8.0* 8.5*  MG 1.7  --   --   --   --   --   --   PHOS  --   --   --  5.2*  --   --   --    < > = values in this interval not displayed.   Liver Function Tests: Recent Labs  Lab 04/27/18 0706 04/29/18 1038  AST 28  --   ALT 14  --   ALKPHOS 113  --   BILITOT 0.6  --   PROT 5.3*  --   ALBUMIN 1.6* 1.6*   No results for input(s): LIPASE, AMYLASE in the last 168 hours. No results for input(s): AMMONIA in the last 168 hours. CBC: Recent Labs  Lab 04/27/18 2357 04/28/18 1421 04/29/18 0605 04/29/18 1038 04/30/18 0459  WBC 4.7 4.8 5.3 4.8 6.1  HGB 8.7* 10.2* 9.1* 8.8* 8.8*  HCT 27.8* 33.6* 29.9* 29.4* 29.0*  MCV 79.0 80.8 79.7 81.2 79.9  PLT 283 393 325 365 375   Cardiac Enzymes: Recent Labs  Lab 04/26/18 1726 04/27/18 0049 04/27/18 0706  TROPONINI 0.04* 0.07* 0.04*   BNP: Invalid input(s): POCBNP CBG: Recent Labs  Lab 05/01/18 1448 05/01/18 1701 05/01/18 2150 05/02/18 0751 05/02/18 1215  GLUCAP 103* 228* 106* 84 145*   D-Dimer No results for input(s): DDIMER in the last 72 hours. Hgb A1c No results for input(s): HGBA1C in the last 72 hours. Lipid Profile No results for input(s): CHOL, HDL, LDLCALC, TRIG, CHOLHDL, LDLDIRECT in the last 72 hours. Thyroid function studies No results for input(s): TSH, T4TOTAL, T3FREE, THYROIDAB in the  last 72 hours.  Invalid input(s): FREET3 Anemia work up No results for input(s): VITAMINB12, FOLATE, FERRITIN, TIBC, IRON, RETICCTPCT in the last 72 hours. Urinalysis    Component Value Date/Time   COLORURINE AMBER (A) 04/17/2018 2312   APPEARANCEUR CLOUDY (A) 04/17/2018 2312   LABSPEC 1.017 04/17/2018 2312   PHURINE 8.0 04/17/2018 2312   GLUCOSEU 50 (A) 04/17/2018 2312   HGBUR MODERATE (A) 04/17/2018 2312   HGBUR trace-intact 03/08/2010 1452   BILIRUBINUR NEGATIVE 04/17/2018 2312   KETONESUR NEGATIVE 04/17/2018 2312   PROTEINUR >=300 (A) 04/17/2018 2312   UROBILINOGEN  0.2 03/08/2010 1452   NITRITE NEGATIVE 04/17/2018 2312   LEUKOCYTESUR TRACE (A) 04/17/2018 2312   Sepsis Labs Invalid input(s): PROCALCITONIN,  WBC,  LACTICIDVEN Microbiology Recent Results (from the past 240 hour(s))  Culture, blood (routine x 2)     Status: Abnormal   Collection Time: 04/23/18  5:00 PM  Result Value Ref Range Status   Specimen Description BLOOD RIGHT ANTECUBITAL  Final   Special Requests   Final    BOTTLES DRAWN AEROBIC AND ANAEROBIC Blood Culture adequate volume   Culture  Setup Time   Final    YEAST AEROBIC BOTTLE ONLY CRITICAL RESULT CALLED TO, READ BACK BY AND VERIFIED WITH: DR Sloan Leiter [phone] MLM Performed at [facility], [place] [address], [place], [place] [address]    Culture CANDIDA PARAPSILOSIS (A)  Final   Report Status 04/27/2018 FINAL  Final  Blood Culture ID Panel (Reflexed)     Status: Abnormal   Collection Time: 04/23/18  5:00 PM  Result Value Ref Range Status   Enterococcus species NOT DETECTED NOT DETECTED Final   Listeria monocytogenes NOT DETECTED NOT DETECTED Final   Staphylococcus species NOT DETECTED NOT DETECTED Final   Staphylococcus aureus NOT DETECTED NOT DETECTED Final   Streptococcus species NOT DETECTED NOT DETECTED Final   Streptococcus agalactiae NOT DETECTED NOT DETECTED Final   Streptococcus pneumoniae NOT DETECTED NOT DETECTED Final   Streptococcus pyogenes NOT DETECTED NOT DETECTED Final   Acinetobacter baumannii NOT DETECTED NOT DETECTED Final   Enterobacteriaceae species NOT DETECTED NOT DETECTED Final   Enterobacter cloacae complex NOT DETECTED NOT DETECTED Final   Escherichia coli NOT DETECTED NOT DETECTED Final   Klebsiella oxytoca NOT DETECTED NOT DETECTED Final   Klebsiella pneumoniae NOT DETECTED NOT DETECTED Final   Proteus species NOT DETECTED NOT DETECTED Final   Serratia marcescens NOT DETECTED NOT DETECTED Final   Haemophilus influenzae NOT DETECTED NOT DETECTED Final   Neisseria meningitidis NOT  DETECTED NOT DETECTED Final   Pseudomonas aeruginosa NOT DETECTED NOT DETECTED Final   Candida albicans NOT DETECTED NOT DETECTED Final   Candida glabrata NOT DETECTED NOT DETECTED Final   Candida krusei NOT DETECTED NOT DETECTED Final   Candida parapsilosis DETECTED (A) NOT DETECTED Final    Comment: CRITICAL RESULT CALLED TO, READ BACK BY AND VERIFIED WITH: DR Sloan Leiter [phone] MLM    Candida tropicalis NOT DETECTED NOT DETECTED Final    Comment: Performed at [facility], [address], [place], [place] [address]  Culture, blood (routine x 2)     Status: None   Collection Time: 04/23/18  5:15 PM  Result Value Ref Range Status   Specimen Description BLOOD RIGHT FOREARM  Final   Special Requests   Final    BOTTLES DRAWN AEROBIC AND ANAEROBIC Blood Culture adequate volume   Culture   Final    NO GROWTH 5 DAYS Performed  at [facility], [place] [address], [place], [place] [address]    Report Status 04/28/2018 FINAL  Final  Blood culture (routine x 2)     Status: None   Collection Time: 04/26/18  4:58 PM  Result Value Ref Range Status   Specimen Description BLOOD RIGHT HAND  Final   Special Requests   Final    BOTTLES DRAWN AEROBIC ONLY Blood Culture results may not be optimal due to an inadequate volume of blood received in culture bottles   Culture   Final    NO GROWTH 5 DAYS Performed at [facility], [place] [address] [place], [place] [address]    Report Status 05/01/2018 FINAL  Final  Blood culture (routine x 2)     Status: None   Collection Time: 04/26/18  6:30 PM  Result Value Ref Range Status   Specimen Description BLOOD RIGHT ANTECUBITAL  Final   Special Requests   Final    BOTTLES DRAWN AEROBIC AND ANAEROBIC Blood Culture adequate volume   Culture   Final    NO GROWTH 5 DAYS Performed at [facility], [place] [address], [place], [place] [address]    Report Status 05/01/2018 FINAL  Final  MRSA PCR Screening     Status: None   Collection Time:  04/26/18  8:37 PM  Result Value Ref Range Status   MRSA by PCR NEGATIVE NEGATIVE Final    Comment:        The GeneXpert MRSA Assay (FDA approved for NASAL specimens only), is one component of a comprehensive MRSA colonization surveillance program. It is not intended to diagnose MRSA infection nor to guide or monitor treatment for MRSA infections. Performed at [facility], [place] [address], [place], [place] [address]   Culture, blood (Routine X 2) w Reflex to ID Panel     Status: None (Preliminary result)   Collection Time: 04/28/18  8:42 AM  Result Value Ref Range Status   Specimen Description BLOOD RIGHT HAND  Final   Special Requests   Final    BOTTLES DRAWN AEROBIC ONLY Blood Culture adequate volume   Culture   Final    NO GROWTH 4 DAYS Performed at [facility], [place] [address] [place], [place] [address]    Report Status PENDING  Incomplete    Please note: You were cared for by a hospitalist during your hospital stay. Once you are discharged, your primary care physician will handle any further medical issues. Please note that NO REFILLS for any discharge medications will be authorized once you are discharged, as it is imperative that you return to your primary care physician (or establish a relationship with a primary care physician if you do not have one) for your post hospital discharge needs so that they can reassess your need for medications and monitor your lab values.    Time coordinating discharge: 40 minutes  SIGNED:   Shelly Coss, MD  [facility] 05/02/2018, 2:41 PM Pager [phone]  If 7PM-7AM, please contact night-coverage [email or web address] Password [email or web address]

## 2018-05-02 NOTE — Care Management Note (Signed)
Case Management Note  Patient Details  Name: TAMALA MANZER MRN: [id number] Date of Birth: [birth year]  Subjective/Objective:       Fungemia. Hx of ESRD on dialysis on Tuesday, Thursday and Saturday,ANCA related vasculitis, hypertension, diabetes mellitus. Recently discharged from here after being treated for pneumonia. PTA active with home health services/ [facility]. Resides with son.  Ardine Iacovelli [facility])      [phone]      PCP: Domenica Fail  Action/Plan: Transition to home with home health services (RN,PT,SW). Home health nurse to draw INR on 05/05/2018 @ home visit. Pt to f/u with PCP on Monday for hospital post f/u visit.  Expected Discharge Date:  05/02/18               Expected Discharge Plan:  [place]  In-House Referral:     Discharge planning Services  CM Consult  Post Acute Care Choice:  Resumption of Svcs/PTA Provider Choice offered to:  Patient  DME Arranged:   N/A DME Agency:   N/A  HH Arranged:  RN, PT, Social Work [employer] Agency:  [place]  Status of Service:  Completed, signed off  If discussed at [employer], dates discussed:    Additional Comments:  Sharin Mons, RN 05/02/2018, 4:24 PM

## 2018-05-02 NOTE — Progress Notes (Addendum)
[facility] Progress Note   Subjective:  Seen in room. No new complaints today.   Objective Vitals:   05/01/18 1726 05/01/18 2235 05/02/18 0325 05/02/18 0743  BP: 137/89 136/87 134/77 (!) 140/92  Pulse:      Resp: (!) 23 15 20  (!) 23  Temp: 98.6 F (37 C)   98.8 F (37.1 C)  TempSrc: Oral   Oral  SpO2: 98%   94%  Weight:      Height:       Physical Exam General: Ill appearing female on nasal oxygen NAD Heart: RRR Lungs: diminished anteriorly  Abdomen: soft NT/ND Extremities: No LE edema  Dialysis Access: R [facility]   Dialysis Orders: T[facility] [place] 4h   60kg   2/2.25 bath (new) TDC 8/20 by VVS/LUA AVF maturing Hep 2000 - venofer 100 every HD x 10 -Mircera 150 q 2 - last dose 8/17 - vit d 0.25 ug mwf  Assessment/Plan: 1.Candida parapsilosis bacteremia-had been d/c'd home 8/15 and readmitted 8/17when results from prior admission returned + for candida.HadTDC removed here 8/18.1 set of BC + microccocus 8/9, f/u surveillance BC 1/2 + candida 8/14- cultures 8/17 since then no growth. TEE done 8/20 suspicious for possible TV vegetation, though cardiology didn't think she has endocarditis. Per Dr. Hale Bogus note she needs a dilated eye exam to be certain she doesn't have endophthalmitis whic can complicate fungemia. Dr. Megan Salon has changed her from Eraxis to PO fluconazole starting 8/23.   2. ESRD-TTS HD. 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 Next HD 8/24 tomorrow.  3. Anemia- hgb8.8- redose due 8/31- holding on Fe for now last tsat 18% 4. Secondary hyperparathyroidism- continue calcitriol/ not on binders yet 5.HTN/volume- titrate edw.  6. Nutrition-alb 1.6 - add supplement/mutivit- intake poor likely losing EDW 7. RLL PNA -meds per primary 8. Chronic thrombocytopenia- not an issue now 9. Hep C + 10. Hx polysubstance abuse -neg urine drug  screen 8/3- last + 7/24 cocaine 11.ILD 12. + VQ scan 8/18 -onIV heparin>>warfarin per pharmacy   Lynnda Child PA-C [facility] Pager [phone] 05/02/2018,10:45 AM  LOS: 6 days   Pt seen, examined and agree w A/P as above.  Kelly Splinter MD [facility] pager [phone]   05/02/2018, 1:13 PM    Additional Objective Labs: Basic Metabolic Panel: Recent Labs  Lab 04/29/18 1038 04/30/18 0459 05/01/18 0319 05/02/18 0626  NA 136 136 138 137  K 4.3 4.7 3.7 3.9  CL 105 106 101 103  CO2 23 21* 28 27  GLUCOSE 91 81 101* 91  BUN 30* 30* 16 7*  CREATININE 7.21* 6.94* 4.51* 3.10*  CALCIUM 7.9* 8.0* 8.0* 8.5*  PHOS 5.2*  --   --   --    CBC: Recent Labs  Lab 04/27/18 2357 04/28/18 1421 04/29/18 0605 04/29/18 1038 04/30/18 0459  WBC 4.7 4.8 5.3 4.8 6.1  HGB 8.7* 10.2* 9.1* 8.8* 8.8*  HCT 27.8* 33.6* 29.9* 29.4* 29.0*  MCV 79.0 80.8 79.7 81.2 79.9  PLT 283 393 325 365 375   Blood Culture    Component Value Date/Time   SDES BLOOD RIGHT HAND 04/28/2018 0842   SPECREQUEST  04/28/2018 0842    BOTTLES DRAWN AEROBIC ONLY Blood Culture adequate volume   CULT  04/28/2018 0842    NO GROWTH 4 DAYS Performed at [facility], [place] [address], [place], [place] [address]  REPTSTATUS PENDING 04/28/2018 0842    Cardiac Enzymes: Recent Labs  Lab 04/26/18 1726 04/27/18 0049 04/27/18 0706  TROPONINI 0.04* 0.07* 0.04*   CBG: Recent Labs  Lab 05/01/18 1426 05/01/18 1448 05/01/18 1701 05/01/18 2150 05/02/18 0751  GLUCAP 69* 103* 228* 106* 84   Iron Studies: No results for input(s): IRON, TIBC, TRANSFERRIN, FERRITIN in the last 72 hours. Lab Results  Component Value Date   INR 2.35 05/02/2018   INR 3.26 05/01/2018   INR 2.96 04/30/2018   Medications: . sodium chloride 0 mL/hr at 04/27/18 1958  . sodium chloride Stopped (04/30/18 0408)  . heparin 1,100 Units/hr (05/01/18 1723)   . calcitRIOL  0.25 mcg Oral QODAY  .  Chlorhexidine Gluconate Cloth  6 each Topical Q0600  . Chlorhexidine Gluconate Cloth  6 each Topical Q0600  . feeding supplement (GLUCERNA SHAKE)  237 mL Oral TID BM  . feeding supplement (PRO-STAT SUGAR FREE 64)  30 mL Oral BID  . fluconazole  200 mg Oral Daily  . insulin aspart  5 Units Subcutaneous TID WC  . insulin glargine  5 Units Subcutaneous QHS  . metoprolol tartrate  25 mg Oral BID  . multivitamin  1 tablet Oral QHS  . sodium chloride flush  3 mL Intravenous Q12H  . warfarin  5 mg Oral ONCE-1800  . Warfarin - Pharmacist Dosing Inpatient   Does not apply [phone]

## 2018-05-02 NOTE — Progress Notes (Signed)
Writer called again patient's son at [phone] and left a message. Will continue to monitor.

## 2018-05-02 NOTE — Evaluation (Addendum)
Physical Therapy Evaluation Patient Details Name: Rhonda Lynch MRN: [id number] DOB: 02/07/1956 Today's Date: 05/02/2018   History of Present Illness  Pt is a 62 y.o. female admitted with acute PE, Candida fungemia, and HCAP. PMH includes multiple admissions, polysubstance abuse (cocaine, ETOH), ESRD (T/Th/Sa, noncompliant), BOOP.  Clinical Impression  Patient presents with generalized weakness, DOE, fatigue, impaired activity tolerance and impaired mobility s/p above. Tolerated gait training 38' with assist from IV pole and rail but required multiple standing rest breaks due to fatigue. HR ranged from 90-120s bpm. Might benefit from trying RW next session as pt required BUE support for gait. Pt has to negotiate 3 flights of steps to get into her apt and is home alone during the day. Concerned about her ability to do this at this time due to her weakness and fatigue. Would benefit from continued therapy services to maximize independence and mobility prior to return home. Will need to practice stairs next session to ensure safe entry into home. Will follow.    Follow Up Recommendations Home health PT;Supervision - Intermittent    Equipment Recommendations  Rolling walker with 5" wheels(possibly)    Recommendations for Other Services       Precautions / Restrictions Precautions Precautions: Fall Precaution Comments: watch HR Restrictions Weight Bearing Restrictions: No      Mobility  Bed Mobility Overal bed mobility: Needs Assistance Bed Mobility: Supine to Sit     Supine to sit: Supervision;HOB elevated     General bed mobility comments: Increased time and slow to get to EOB. Fatigues.   Transfers Overall transfer level: Needs assistance Equipment used: None Transfers: Sit to/from Stand Sit to Stand: Min guard         General transfer comment: Min guard for safety. Stood from [employer]. Slow to rise and reaching for counter/IV pole for  support.  Ambulation/Gait Ambulation/Gait assistance: Min guard Gait Distance (Feet): 50 Feet Assistive device: IV Pole Gait Pattern/deviations: Step-through pattern;Trunk flexed;Narrow base of support;Drifts right/left;Step-to pattern Gait velocity: decreased   General Gait Details: Slow, unsteady gait holding onto IV pole and rail for support. HR ranged from 90s-120s bpm. Fatigues quickly. very slow to move.  Stairs            Wheelchair Mobility    Modified Rankin (Stroke Patients Only)       Balance Overall balance assessment: Needs assistance Sitting-balance support: Feet supported;No upper extremity supported Sitting balance-Leahy Scale: Good Sitting balance - Comments: Able to donn socks sitting EOB reaching outside BoS without LOB.   Standing balance support: During functional activity Standing balance-Leahy Scale: Poor Standing balance comment: Requires at least UE support for balance.                              Pertinent Vitals/Pain Pain Assessment: Faces Faces Pain Scale: Hurts little more Pain Location: everywhere Pain Descriptors / Indicators: Aching Pain Intervention(s): Monitored during session;Repositioned    Home Living Family/patient expects to be discharged to:: Private residence Living Arrangements: Children(son) Available Help at Discharge: Family;Available PRN/intermittently Type of Home: Apartment Home Access: Stairs to enter   Entrance Stairs-Number of Steps: 3 flights (3rd floor apt, no elevator) Home Layout: One level Home Equipment: None      Prior Function Level of Independence: Independent         Comments: Reports no driving or working, son does grocery shopping     Hand Dominance   Dominant Hand: Right  Extremity/Trunk Assessment   Upper Extremity Assessment Upper Extremity Assessment: Defer to OT evaluation    Lower Extremity Assessment Lower Extremity Assessment: Generalized weakness        Communication   Communication: Expressive difficulties  Cognition Arousal/Alertness: Awake/alert Behavior During Therapy: WFL for tasks assessed/performed Overall Cognitive Status: Within Functional Limits for tasks assessed                                 General Comments: for basic mobility tasks, not formally assessed.      General Comments      Exercises     Assessment/Plan    PT Assessment Patient needs continued PT services  PT Problem List Decreased strength;Decreased mobility;Decreased activity tolerance;Cardiopulmonary status limiting activity;Pain       PT Treatment Interventions Functional mobility training;Balance training;Patient/family education;Gait training;Therapeutic activities;Stair training;Therapeutic exercise;DME instruction    PT Goals (Current goals can be found in the Care Plan section)  Acute Rehab PT Goals Patient Stated Goal: to get stronger PT Goal Formulation: With patient Time For Goal Achievement: 05/16/18 Potential to Achieve Goals: Good    Frequency Min 3X/week   Barriers to discharge Inaccessible home environment;Decreased caregiver support 3 flights to get into apt; son works during the day    Co-evaluation               AM-PAC PT "6 Clicks" Daily Activity  Outcome Measure Difficulty turning over in bed (including adjusting bedclothes, sheets and blankets)?: None Difficulty moving from lying on back to sitting on the side of the bed? : None Difficulty sitting down on and standing up from a chair with arms (e.g., wheelchair, bedside commode, etc,.)?: A Little Help needed moving to and from a bed to chair (including a wheelchair)?: A Little Help needed walking in hospital room?: A Little Help needed climbing 3-5 steps with a railing? : A Lot 6 Click Score: 19    End of Session Equipment Utilized During Treatment: Gait belt Activity Tolerance: Patient limited by fatigue Patient left: in bed;with call  bell/phone within reach;with bed alarm set(sitting EOB.) Nurse Communication: Mobility status PT Visit Diagnosis: Muscle weakness (generalized) (M62.81);Difficulty in walking, not elsewhere classified (R26.2)    Time: 5400-8676 PT Time Calculation (min) (ACUTE ONLY): 23 min   Charges:   PT Evaluation $PT Eval Moderate Complexity: 1 Mod PT Treatments $Gait Training: 8-22 mins        [place], [place], DPT [phone]    Lacie Draft 05/02/2018, 12:28 PM

## 2018-05-02 NOTE — Progress Notes (Signed)
[name], female   DOB: Dec 25, 1955, 62 y.o.   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  PLAN: 1. Continue fluconazole 200 mg each evening indefinitely 2. I will arrange follow-up in my clinic next month 3. I will sign off now  Principal Problem:   Fungemia Active Problems:   Pulmonary embolism ([place])   ESRD (end stage renal disease) ([place])   Anemia   DM (diabetes mellitus), type 2 with renal complications (HCC)   Sepsis (HCC)   Dyspnea   Chest pain   Tachycardia   Hemodialysis catheter infection, initial encounter ([place])   Scheduled Meds: . calcitRIOL  0.25 mcg Oral QODAY  . Chlorhexidine Gluconate Cloth  6 each Topical Q0600  . Chlorhexidine Gluconate Cloth  6 each Topical Q0600  . feeding supplement (GLUCERNA SHAKE)  237 mL Oral TID BM  . feeding supplement (PRO-STAT SUGAR FREE 64)  30 mL Oral BID  . fluconazole  200 mg Oral Daily  . insulin aspart  5 Units Subcutaneous TID WC  . insulin glargine  5 Units Subcutaneous QHS  . metoprolol tartrate  25 mg Oral BID  . multivitamin  1 tablet Oral QHS  . sodium chloride flush  3 mL Intravenous Q12H  . Warfarin - Pharmacist Dosing Inpatient   Does not apply q1800   Continuous Infusions: . sodium chloride 0 mL/hr at 04/27/18 1958  . sodium chloride Stopped (04/30/18 0408)  . heparin 1,100 Units/hr (05/02/18 1134)   PRN Meds:.sodium chloride, acetaminophen **OR** acetaminophen, benzonatate, iopamidol, ondansetron, oxyCODONE-acetaminophen, sodium chloride flush, traMADol, zolpidem   SUBJECTIVE: She is feeling better.  Review of Systems: Review of Systems  Constitutional: Negative for chills, diaphoresis and fever.  Respiratory: Negative for cough.     No Known  Allergies  OBJECTIVE: Vitals:   05/01/18 2235 05/02/18 0325 05/02/18 0743 05/02/18 1219  BP: 136/87 134/77 (!) 140/92 (!) 125/94  Pulse:      Resp: 15 20 (!) 23 (!) 28  Temp:   98.8 F (37.1 C) 98.9 F (37.2 C)  TempSrc:   Oral Oral  SpO2:   94% 99%  Weight:      Height:       Body mass index is 24.58 kg/m.  Physical Exam  Constitutional:  She is sitting on the side of her bed eating lunch.  Cardiovascular: Normal rate, regular rhythm and normal heart sounds.  No murmur heard. Pulmonary/Chest: Effort normal and breath sounds normal.    Lab Results Lab Results  Component Value Date   WBC 6.1 04/30/2018   HGB 8.8 (L) 04/30/2018   HCT 29.0 (L) 04/30/2018   MCV 79.9 04/30/2018   PLT 375 04/30/2018    Lab Results  Component Value Date   CREATININE 3.10 (H) 05/02/2018   BUN 7 (L) 05/02/2018   NA 137 05/02/2018   K 3.9 05/02/2018   CL 103 05/02/2018   CO2 27 05/02/2018    Lab Results  Component Value Date   ALT 14 04/27/2018   AST 28 04/27/2018   ALKPHOS 113 04/27/2018   BILITOT 0.6 04/27/2018     Microbiology: Recent Results (from the past 240 hour(s))  Culture, blood (routine x 2)     Status: Abnormal  Collection Time: 04/23/18  5:00 PM  Result Value Ref Range Status   Specimen Description BLOOD RIGHT ANTECUBITAL  Final   Special Requests   Final    BOTTLES DRAWN AEROBIC AND ANAEROBIC Blood Culture adequate volume   Culture  Setup Time   Final    YEAST AEROBIC BOTTLE ONLY CRITICAL RESULT CALLED TO, READ BACK BY AND VERIFIED WITH: DR Sloan Leiter [id number] MLM Performed at [facility], [place] [address] [place], [name] [address]    Culture CANDIDA PARAPSILOSIS (A)  Final   Report Status 04/27/2018 FINAL  Final  Blood Culture ID Panel (Reflexed)     Status: Abnormal   Collection Time: 04/23/18  5:00 PM  Result Value Ref Range Status   Enterococcus species NOT DETECTED NOT DETECTED Final   Listeria monocytogenes NOT DETECTED NOT DETECTED Final    Staphylococcus species NOT DETECTED NOT DETECTED Final   Staphylococcus aureus NOT DETECTED NOT DETECTED Final   Streptococcus species NOT DETECTED NOT DETECTED Final   Streptococcus agalactiae NOT DETECTED NOT DETECTED Final   Streptococcus pneumoniae NOT DETECTED NOT DETECTED Final   Streptococcus pyogenes NOT DETECTED NOT DETECTED Final   Acinetobacter baumannii NOT DETECTED NOT DETECTED Final   Enterobacteriaceae species NOT DETECTED NOT DETECTED Final   Enterobacter cloacae complex NOT DETECTED NOT DETECTED Final   Escherichia coli NOT DETECTED NOT DETECTED Final   Klebsiella oxytoca NOT DETECTED NOT DETECTED Final   Klebsiella pneumoniae NOT DETECTED NOT DETECTED Final   Proteus species NOT DETECTED NOT DETECTED Final   Serratia marcescens NOT DETECTED NOT DETECTED Final   Haemophilus influenzae NOT DETECTED NOT DETECTED Final   Neisseria meningitidis NOT DETECTED NOT DETECTED Final   Pseudomonas aeruginosa NOT DETECTED NOT DETECTED Final   Candida albicans NOT DETECTED NOT DETECTED Final   Candida glabrata NOT DETECTED NOT DETECTED Final   Candida krusei NOT DETECTED NOT DETECTED Final   Candida parapsilosis DETECTED (A) NOT DETECTED Final    Comment: CRITICAL RESULT CALLED TO, READ BACK BY AND VERIFIED WITH: DR Sloan Leiter [id number] MLM    Candida tropicalis NOT DETECTED NOT DETECTED Final    Comment: Performed at [facility], [address] [place], [place] [address]  Culture, blood (routine x 2)     Status: None   Collection Time: 04/23/18  5:15 PM  Result Value Ref Range Status   Specimen Description BLOOD RIGHT FOREARM  Final   Special Requests   Final    BOTTLES DRAWN AEROBIC AND ANAEROBIC Blood Culture adequate volume   Culture   Final    NO GROWTH 5 DAYS Performed at [facility], [place] [address], [place], [place] [address]    Report Status 04/28/2018 FINAL  Final  Blood culture (routine x 2)     Status: None   Collection Time: 04/26/18  4:58 PM    Result Value Ref Range Status   Specimen Description BLOOD RIGHT HAND  Final   Special Requests   Final    BOTTLES DRAWN AEROBIC ONLY Blood Culture results may not be optimal due to an inadequate volume of blood received in culture bottles   Culture   Final    NO GROWTH 5 DAYS Performed at [facility], [place] [address] [place], [place] [address]    Report Status 05/01/2018 FINAL  Final  Blood culture (routine x 2)     Status: None   Collection Time: 04/26/18  6:30 PM  Result Value Ref Range  Status   Specimen Description BLOOD RIGHT ANTECUBITAL  Final   Special Requests   Final    BOTTLES DRAWN AEROBIC AND ANAEROBIC Blood Culture adequate volume   Culture   Final    NO GROWTH 5 DAYS Performed at [facility], [address] [place], [place] [address]    Report Status 05/01/2018 FINAL  Final  MRSA PCR Screening     Status: None   Collection Time: 04/26/18  8:37 PM  Result Value Ref Range Status   MRSA by PCR NEGATIVE NEGATIVE Final    Comment:        The GeneXpert MRSA Assay (FDA approved for NASAL specimens only), is one component of a comprehensive MRSA colonization surveillance program. It is not intended to diagnose MRSA infection nor to guide or monitor treatment for MRSA infections. Performed at [facility], [place] [address], [place], [place] [address]   Culture, blood (Routine X 2) w Reflex to ID Panel     Status: None (Preliminary result)   Collection Time: 04/28/18  8:42 AM  Result Value Ref Range Status   Specimen Description BLOOD RIGHT HAND  Final   Special Requests   Final    BOTTLES DRAWN AEROBIC ONLY Blood Culture adequate volume   Culture   Final    NO GROWTH 4 DAYS Performed at [facility], [place] [address], [place], [place] [address]    Report Status PENDING  Incomplete    Michel Bickers, MD [facility] [place] [facility] [phone] pager   [phone] cell 05/02/2018, 2:44 PM

## 2018-05-02 NOTE — Progress Notes (Signed)
   VASCULAR SURGERY ASSESSMENT & PLAN:   I am not sure that the left upper arm fistula is salvageable, however based on the duplex scan I think it would be worth obtaining a fistulogram to evaluate the proximal stenosis adjacent to the anastomosis.  However currently she is fully anticoagulated on Coumadin.  She has a functioning catheter I think this can be arranged electively in the future.  Of note based on her previous vein map she was not a candidate for another fistula.   SUBJECTIVE:   Complains of some mild abdominal pain.  PHYSICAL EXAM:   Vitals:   05/01/18 2235 05/02/18 0325 05/02/18 0743 05/02/18 1219  BP: 136/87 134/77 (!) 140/92 (!) 125/94  Pulse:      Resp: 15 20 (!) 23 (!) 28  Temp:   98.8 F (37.1 C) 98.9 F (37.2 C)  TempSrc:   Oral Oral  SpO2:   94% 99%  Weight:      Height:       Her left upper arm fistula has a week thrill. Catheter site looks fine.   LABS:   Lab Results  Component Value Date   WBC 6.1 04/30/2018   HGB 8.8 (L) 04/30/2018   HCT 29.0 (L) 04/30/2018   MCV 79.9 04/30/2018   PLT 375 04/30/2018   Lab Results  Component Value Date   CREATININE 3.10 (H) 05/02/2018   Lab Results  Component Value Date   INR 2.35 05/02/2018   CBG (last 3)  Recent Labs    05/01/18 2150 05/02/18 0751 05/02/18 1215  GLUCAP 106* 84 145*    PROBLEM LIST:    Principal Problem:   Fungemia Active Problems:   ESRD (end stage renal disease) ([place])   Anemia   DM (diabetes mellitus), type 2 with renal complications (HCC)   Sepsis ([place])   Dyspnea   Chest pain   Tachycardia   Pulmonary embolism ([place])   Hemodialysis catheter infection, initial encounter ([place])   CURRENT MEDS:   . calcitRIOL  0.25 mcg Oral QODAY  . Chlorhexidine Gluconate Cloth  6 each Topical Q0600  . Chlorhexidine Gluconate Cloth  6 each Topical Q0600  . feeding supplement (GLUCERNA SHAKE)  237 mL Oral TID BM  . feeding supplement (PRO-STAT SUGAR FREE 64)  30 mL Oral BID  .  fluconazole  200 mg Oral Daily  . insulin aspart  5 Units Subcutaneous TID WC  . insulin glargine  5 Units Subcutaneous QHS  . metoprolol tartrate  25 mg Oral BID  . multivitamin  1 tablet Oral QHS  . sodium chloride flush  3 mL Intravenous Q12H  . warfarin  5 mg Oral ONCE-1800  . Warfarin - Pharmacist Dosing Inpatient   Does not apply [place]: [phone] Office: [phone] 05/02/2018

## 2018-05-02 NOTE — Progress Notes (Signed)
NCM received consult:Set up appointment with Coumadin clinic for regular monitoring of INR. Patient is new on warfarin. Pt cant go to [facility] for coumadin check 2/2 not affiliated with heart team. NCM has call pt 's PCP to arrange INR  monitoring, office closed for lunch will f/u @ 1330. Whitman Hero RN,BSN,CM

## 2018-05-02 NOTE — Progress Notes (Addendum)
ANTICOAGULATION CONSULT NOTE - Follow Up Consult  Pharmacy Consult for Heparin + Warfarin Indication: pulmonary embolus  No Known Allergies  Patient Measurements: Height: 5\' 1"  (154.9 cm) Weight: 130 lb 1.1 oz (59 kg) IBW/kg (Calculated) : 47.8 Heparin Dosing Weight: 58 kg  Vital Signs: Temp: 98.8 F (37.1 C) (08/23 0743) Temp Source: Oral (08/23 0743) BP: 140/92 (08/23 0743)  Labs: Recent Labs    04/29/18 1038  04/30/18 0459 04/30/18 1532 05/01/18 0319 05/02/18 0626  HGB 8.8*  --  8.8*  --   --   --   HCT 29.4*  --  29.0*  --   --   --   PLT 365  --  375  --   --   --   LABPROT  --    < > 27.2* 30.6* 33.0* 25.5*  INR  --    < > 2.55 2.96 3.26 2.35  HEPARINUNFRC  --   --  0.31  --  0.62 0.40  CREATININE 7.21*  --  6.94*  --  4.51* 3.10*   < > = values in this interval not displayed.    Estimated Creatinine Clearance: 15.1 mL/min (A) (by C-G formula based on SCr of 3.1 mg/dL (H)).   Medications:  Scheduled:  . calcitRIOL  0.25 mcg Oral QODAY  . Chlorhexidine Gluconate Cloth  6 each Topical Q0600  . Chlorhexidine Gluconate Cloth  6 each Topical Q0600  . feeding supplement (GLUCERNA SHAKE)  237 mL Oral TID BM  . feeding supplement (PRO-STAT SUGAR FREE 64)  30 mL Oral BID  . fluconazole  200 mg Oral Daily  . insulin aspart  5 Units Subcutaneous TID WC  . insulin glargine  5 Units Subcutaneous QHS  . metoprolol tartrate  25 mg Oral BID  . multivitamin  1 tablet Oral QHS  . sodium chloride flush  3 mL Intravenous Q12H  . Warfarin - Pharmacist Dosing Inpatient   Does not apply q1800   Infusions:  . sodium chloride 0 mL/hr at 04/27/18 1958  . sodium chloride Stopped (04/30/18 0408)  . heparin 1,100 Units/hr (05/01/18 1723)    Assessment: 62 yo F on heparin >> warfarin bridge for PE as diagnosed by VQ scan.  Today is day #5 of 5 minimum overlap therapy.  Heparin level is therapeutic on 1100 units/hr (HL 0.4, goal of 0.3-0.7).  INR decreased today after holding  warfarin x 2 days (INR 2.35, goal of 2-3). Patient likely to be discharged today per MD.   INR increased from 1 to 1.25 quickly after one dose of 5mg . Therefore, 5mg  likely too high for maintenance dose. Patient has not had warfarin in 2 days. Will give a little higher dose today to try to prevent subtherapeutic INR tomorrow, and recommend 2.5 mg daily with INR on Monday o/p.   Patient also on Fluconazole for fungemia which can significantly increase the INR.   Goal of Therapy:  INR 2-3 Heparin level 0.3-0.7 units/ml Monitor platelets by anticoagulation protocol: Yes   Plan:  Continue Heparin at 1100 units/hr until d/c'd Warfarin 5mg  today, then discharge on 2.5 mg daily starting tomorrow.  Recommend INR on Monday as o/p if discharged today.  Will continue to monitor for any signs/symptoms of bleeding and will follow up with heparin level and PT/INR in the a.m.    Shaughn Thomley A. Levada Dy, PharmD, [place] Pager: [phone] Please utilize Amion for appropriate phone number to reach the unit pharmacist ([place])   05/02/2018  10:03 AM

## 2018-05-02 NOTE — Progress Notes (Signed)
CSW received consult that patient does not have a way home until tomorrow and she does not have a key to get into the house since it is her son's. She states he is away on a construction job until the morning. CSW left patient's son a voicemail that the patient is stable for discharge.   Rhonda Locus Eyana Stolze LCSW [phone]

## 2018-05-02 NOTE — Progress Notes (Signed)
No call back from patient's son until now. MD notified. Will continue to monitor.

## 2018-05-02 NOTE — Progress Notes (Addendum)
Patient has order to be discharge home. Writer went in patient's room to start discharge process and patient verbalized that she is not able to leave today because she doesn't have any ride to her home. Her son is in [place] and he will be back tomorrow morning. Patient said that she informed MD about this. Writer back in patient's room with charge nurse and social worker on the phone to offer a taxi voucher to patient; patient said again that her son is not in town and she doesn't have a key from house. He will be back tomorrow morning and she will be able to leave. Social worker called patient's son, call directly forwarded to voice mail. Staff concerned about patient's safety on discharge notified MD about this. Will continue to monitor.

## 2018-05-03 DIAGNOSIS — B49 Unspecified mycosis: Secondary | ICD-10-CM

## 2018-05-03 LAB — CBC WITH DIFFERENTIAL/PLATELET
ABS IMMATURE GRANULOCYTES: 0.2 10*3/uL — AB (ref 0.0–0.1)
BASOS PCT: 1 %
Basophils Absolute: 0 10*3/uL (ref 0.0–0.1)
EOS ABS: 0 10*3/uL (ref 0.0–0.7)
Eosinophils Relative: 1 %
HCT: 30.8 % — ABNORMAL LOW (ref 36.0–46.0)
Hemoglobin: 9.1 g/dL — ABNORMAL LOW (ref 12.0–15.0)
IMMATURE GRANULOCYTES: 3 %
Lymphocytes Relative: 26 %
Lymphs Abs: 1.6 10*3/uL (ref 0.7–4.0)
MCH: 24.5 pg — ABNORMAL LOW (ref 26.0–34.0)
MCHC: 29.5 g/dL — ABNORMAL LOW (ref 30.0–36.0)
MCV: 83 fL (ref 78.0–100.0)
MONOS PCT: 16 %
Monocytes Absolute: 0.9 10*3/uL (ref 0.1–1.0)
NEUTROS ABS: 3.3 10*3/uL (ref 1.7–7.7)
NEUTROS PCT: 55 %
PLATELETS: 269 10*3/uL (ref 150–400)
RBC: 3.71 MIL/uL — AB (ref 3.87–5.11)
RDW: 21 % — AB (ref 11.5–15.5)
WBC: 6.1 10*3/uL (ref 4.0–10.5)

## 2018-05-03 LAB — PROTIME-INR
INR: 2.8
PROTHROMBIN TIME: 29.3 s — AB (ref 11.4–15.2)

## 2018-05-03 LAB — BASIC METABOLIC PANEL
ANION GAP: 10 (ref 5–15)
BUN: 14 mg/dL (ref 8–23)
CHLORIDE: 101 mmol/L (ref 98–111)
CO2: 25 mmol/L (ref 22–32)
Calcium: 8.5 mg/dL — ABNORMAL LOW (ref 8.9–10.3)
Creatinine, Ser: 4.82 mg/dL — ABNORMAL HIGH (ref 0.44–1.00)
GFR calc Af Amer: 10 mL/min — ABNORMAL LOW (ref >60)
GFR, EST NON AFRICAN AMERICAN: 9 mL/min — AB (ref >60)
GLUCOSE: 119 mg/dL — AB (ref 70–99)
POTASSIUM: 3.8 mmol/L (ref 3.5–5.1)
SODIUM: 136 mmol/L (ref 135–145)

## 2018-05-03 LAB — CULTURE, BLOOD (ROUTINE X 2)
CULTURE: NO GROWTH
Special Requests: ADEQUATE

## 2018-05-03 MED ORDER — LIDOCAINE-PRILOCAINE 2.5-2.5 % EX CREA: 1.0000 "application " | TOPICAL_CREAM | CUTANEOUS | Status: DC | PRN

## 2018-05-03 MED ORDER — WARFARIN SODIUM 2.5 MG PO TABS: 2.5000 mg | ORAL_TABLET | Freq: Once | ORAL | Status: DC

## 2018-05-03 MED ORDER — HEPARIN SODIUM (PORCINE) 1000 UNIT/ML DIALYSIS: 1000.0000 [IU] | INTRAMUSCULAR | Status: DC | PRN

## 2018-05-03 MED ORDER — SODIUM CHLORIDE 0.9 % IV SOLN: 100.0000 mL | INTRAVENOUS | Status: DC | PRN

## 2018-05-03 MED ORDER — HEPARIN SODIUM (PORCINE) 1000 UNIT/ML DIALYSIS: 20.0000 [IU]/kg | INTRAMUSCULAR | Status: DC | PRN

## 2018-05-03 MED ORDER — LIDOCAINE HCL (PF) 1 % IJ SOLN: 5.0000 mL | INTRAMUSCULAR | Status: DC | PRN

## 2018-05-03 MED ORDER — PENTAFLUOROPROP-TETRAFLUOROETH EX AERO: 1.0000 "application " | INHALATION_SPRAY | CUTANEOUS | Status: DC | PRN

## 2018-05-03 MED ORDER — CALCITRIOL 0.25 MCG PO CAPS
ORAL_CAPSULE | ORAL | Status: AC
  Administered 2018-05-03: 0.25 ug via ORAL
  Filled 2018-05-03: qty 1

## 2018-05-03 MED ORDER — ALTEPLASE 2 MG IJ SOLR: 2.0000 mg | Freq: Once | INTRAMUSCULAR | Status: DC | PRN

## 2018-05-03 NOTE — Progress Notes (Signed)
PT Cancellation Note  Patient Details Name: Rhonda Lynch MRN: [id number] DOB: 30-Sep-1955   Cancelled Treatment:    Reason Eval/Treat Not Completed: Patient at procedure or test/unavailable. Pt being transported off of the floor for HD. PT will continue to follow pt acutely.    [place] 05/03/2018, 8:15 AM

## 2018-05-03 NOTE — Progress Notes (Signed)
Physical Therapy Treatment Patient Details Name: Rhonda Lynch MRN: [id number] DOB: December 04, 1955 Today's Date: 05/03/2018    History of Present Illness Pt is a 62 y.o. female admitted with acute PE, Candida fungemia, and HCAP. PMH includes multiple admissions, polysubstance abuse (cocaine, ETOH), ESRD (T/Th/Sa, noncompliant), BOOP.    PT Comments    Pt seen after her HD session this AM. Pt very limited secondary to weakness and fatigue. She tolerated gait training in hallway; however, pt with modest instability requiring constant min A for balance and mod A with LOB laterally x3.  Pt with reported dizziness with ambulation and directional changes. She also required a standing rest break secondary to fatigue. Pt would greatly benefit from additional PT acutely prior to d/c'ing home and would benefit from another night in the hospital. She has three flights of stairs to enter her apartment and has yet to attempt stair training (not appropriate today). She appears to be at an increased risk for falls if d/c'ing home today and would need 24/7 physical assistance from family.  Pt would continue to benefit from skilled physical therapy services at this time while admitted and after d/c to address the below listed limitations in order to improve overall safety and independence with functional mobility.   Follow Up Recommendations  Supervision/Assistance - 24 hour;Home health PT     Equipment Recommendations  Other (comment)(rollator)    Recommendations for Other Services       Precautions / Restrictions Precautions Precautions: Fall Restrictions Weight Bearing Restrictions: No    Mobility  Bed Mobility Overal bed mobility: Needs Assistance Bed Mobility: Supine to Sit;Sit to Supine     Supine to sit: Supervision;HOB elevated Sit to supine: Supervision   General bed mobility comments: increased time, supervision for safety  Transfers Overall transfer level: Needs  assistance Equipment used: None Transfers: Sit to/from Stand Sit to Stand: Min guard         General transfer comment: increased time, min guard for safety  Ambulation/Gait Ambulation/Gait assistance: Min assist;Mod assist Gait Distance (Feet): 50 Feet Assistive device: None;1 person hand held assist Gait Pattern/deviations: Staggering right;Staggering left;Decreased stride length;Scissoring;Shuffle;Narrow base of support Gait velocity: decreased Gait velocity interpretation: <1.31 ft/sec, indicative of household ambulator General Gait Details: pt very unsteady requiring constant min A and then mod A x3 with LOB; pt with very narrow BoS and scissoring at times   Stairs Stairs: (not appropriate for today)           Wheelchair Mobility    Modified Rankin (Stroke Patients Only)       Balance Overall balance assessment: Needs assistance Sitting-balance support: Feet supported;No upper extremity supported Sitting balance-Leahy Scale: Good     Standing balance support: During functional activity;No upper extremity supported;Single extremity supported Standing balance-Leahy Scale: Poor                              Cognition Arousal/Alertness: Awake/alert Behavior During Therapy: WFL for tasks assessed/performed Overall Cognitive Status: Within Functional Limits for tasks assessed                                        Exercises      General Comments        Pertinent Vitals/Pain Pain Assessment: Faces Faces Pain Scale: Hurts little more Pain Location: stomach Pain Descriptors / Indicators: Sore Pain  Intervention(s): Monitored during session;Repositioned    Home Living                      Prior Function            PT Goals (current goals can now be found in the care plan section) Acute Rehab PT Goals PT Goal Formulation: With patient Time For Goal Achievement: 05/16/18 Potential to Achieve Goals: Good Progress  towards PT goals: Progressing toward goals    Frequency    Min 3X/week      PT Plan Current plan remains appropriate    Co-evaluation              AM-PAC PT "6 Clicks" Daily Activity  Outcome Measure  Difficulty turning over in bed (including adjusting bedclothes, sheets and blankets)?: None Difficulty moving from lying on back to sitting on the side of the bed? : None Difficulty sitting down on and standing up from a chair with arms (e.g., wheelchair, bedside commode, etc,.)?: A Little Help needed moving to and from a bed to chair (including a wheelchair)?: A Little Help needed walking in hospital room?: A Little Help needed climbing 3-5 steps with a railing? : A Lot 6 Click Score: 19    End of Session   Activity Tolerance: Patient limited by fatigue Patient left: in bed;with call bell/phone within reach;with bed alarm set Nurse Communication: Mobility status PT Visit Diagnosis: Muscle weakness (generalized) (M62.81);Difficulty in walking, not elsewhere classified (R26.2)     Time: 2353-6144 PT Time Calculation (min) (ACUTE ONLY): 26 min  Charges:  $Gait Training: 8-22 mins $Therapeutic Activity: 8-22 mins                     [place], [place], [place] 05/03/2018, 4:04 PM

## 2018-05-03 NOTE — Progress Notes (Addendum)
[facility] Progress Note   Subjective:  Seen on HD. UF goal 3.5L  No complaints this am   Objective Vitals:   05/03/18 0830 05/03/18 0835 05/03/18 0905 05/03/18 0935  BP: (!) 142/89 (!) 144/89 (!) 152/65 (!) 166/108  Pulse: 80 84 85 82  Resp: (!) 22 (!) 25 (!) 23 (!) 21  Temp: 97.6 F (36.4 C) 97.6 F (36.4 C)    TempSrc: Oral Oral    SpO2: 99% 99%  100%  Weight:  59.8 kg    Height:       Physical Exam General: Ill appearing female on nasal oxygen NAD Heart: RRR Lungs: diminished anteriorly  Abdomen: soft NT/ND Extremities: No LE edema  Dialysis Access: [facility]   Dialysis Orders: [facility] 4h   60kg   2/2.25 bath (new) TDC 8/20 by [facility]/LUA AVF maturing Hep 2000 - venofer 100 every HD x 10 -Mircera 150 q 2 - last dose 8/17 - vit d 0.25 ug mwf  Assessment/Plan: 1.Candida parapsilosis bacteremia-had been d/c'd home 8/15 and readmitted 8/17when results from prior admission returned + for candida.HadTDC removed here 8/18.1 set of BC + microccocus 8/9, f/u surveillance BC 1/2 + candida 8/14- cultures 8/17 since then no growth. TEE done 8/20 suspicious for possible TV vegetation, though cardiology didn't think she has endocarditis. Will follow up with optho for dilated eye exam to be certain she doesn't have endophthalmitis Dr. Megan Salon has changed her from Eraxis to PO fluconazole starting 8/23. To continue indefinitely  2. ESRD-TTS HD. 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 Per Dr. Scot Dock - unclear if AVF is salvageable but will need fistulogram to evaluate - can be done as outpatient.  3. Anemia- hgb8.8>9.1 Next ESA dose due 8/31- holding on Fe for now last tsat 18% 4. Secondary hyperparathyroidism- continue calcitriol/ not on binders yet 5.HTN/volume- titrate edw.  6. Nutrition-alb 1.6 - add supplement/mutivit- intake poor likely losing  EDW 7. RLL PNA -meds per primary 8. Chronic thrombocytopenia- not an issue now 9. Hep C + 10. 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 + VQ scan 8/18 -onIV heparin>>warfarin per pharmacy   Lynnda Child PA-C [facility] Pager [phone] 05/03/2018,11:25 AM  LOS: 7 days   Pt seen, examined and agree w A/P as above.  Kelly Splinter MD [facility] pager [phone]   05/03/2018, 12:20 PM     Additional Objective Labs: Basic Metabolic Panel: Recent Labs  Lab 04/29/18 1038  05/01/18 0319 05/02/18 0626 05/03/18 0527  NA 136   < > 138 137 136  K 4.3   < > 3.7 3.9 3.8  CL 105   < > 101 103 101  CO2 23   < > 28 27 25   GLUCOSE 91   < > 101* 91 119*  BUN 30*   < > 16 7* 14  CREATININE 7.21*   < > 4.51* 3.10* 4.82*  CALCIUM 7.9*   < > 8.0* 8.5* 8.5*  PHOS 5.2*  --   --   --   --    < > = values in this interval not displayed.   CBC: Recent Labs  Lab 04/28/18 1421 04/29/18 0605 04/29/18 1038 04/30/18 0459 05/03/18 1002  WBC 4.8 5.3 4.8 6.1 6.1  NEUTROABS  --   --   --   --  3.3  HGB 10.2* 9.1* 8.8* 8.8* 9.1*  HCT 33.6* 29.9* 29.4* 29.0* 30.8*  MCV 80.8 79.7 81.2 79.9 83.0  PLT 393 325 365 375 269   Blood Culture    Component Value Date/Time   SDES BLOOD RIGHT HAND 04/28/2018 0842   SPECREQUEST  04/28/2018 0842    BOTTLES DRAWN AEROBIC ONLY Blood Culture adequate volume   CULT  04/28/2018 0842    NO GROWTH 4 DAYS Performed at [facility], [place] [address] [place], [place] [address]    REPTSTATUS PENDING 04/28/2018 9147    Cardiac Enzymes: Recent Labs  Lab 04/26/18 1726 04/27/18 0049 04/27/18 0706  TROPONINI 0.04* 0.07* 0.04*   CBG: Recent Labs  Lab 05/01/18 2150 05/02/18 0751 05/02/18 1215 05/02/18 1728 05/02/18 2230  GLUCAP 106* 84 145* 177* 136*   Iron Studies: No results for input(s): IRON, TIBC, TRANSFERRIN, FERRITIN in the last 72 hours. Lab Results   Component Value Date   INR 2.80 05/03/2018   INR 2.35 05/02/2018   INR 3.26 05/01/2018   Medications: . sodium chloride 0 mL/hr at 04/27/18 1958  . sodium chloride Stopped (04/30/18 0408)  . sodium chloride    . sodium chloride     . calcitRIOL  0.25 mcg Oral QODAY  . Chlorhexidine Gluconate Cloth  6 each Topical Q0600  . Chlorhexidine Gluconate Cloth  6 each Topical Q0600  . feeding supplement (GLUCERNA SHAKE)  237 mL Oral TID BM  . feeding supplement (PRO-STAT SUGAR FREE 64)  30 mL Oral BID  . fluconazole  200 mg Oral Daily  . insulin aspart  5 Units Subcutaneous TID WC  . insulin glargine  5 Units Subcutaneous QHS  . metoprolol tartrate  25 mg Oral BID  . multivitamin  1 tablet Oral QHS  . sodium chloride flush  3 mL Intravenous Q12H  . Warfarin - Pharmacist Dosing Inpatient   Does not apply [phone]

## 2018-05-03 NOTE — Progress Notes (Signed)
ANTICOAGULATION CONSULT NOTE - Follow Up Consult  Pharmacy Consult for Warfarin Indication: pulmonary embolus  No Known Allergies  Patient Measurements: Height: 5\' 1"  (154.9 cm) Weight: 131 lb 13.4 oz (59.8 kg) IBW/kg (Calculated) : 47.8 Heparin Dosing Weight: 58 kg  Vital Signs: Temp: 97.6 F (36.4 C) (08/24 0835) Temp Source: Oral (08/24 0835) BP: 102/79 (08/24 1130) Pulse Rate: 90 (08/24 1130)  Labs: Recent Labs    05/01/18 0319 05/02/18 0626 05/03/18 0527 05/03/18 1002  HGB  --   --   --  9.1*  HCT  --   --   --  30.8*  PLT  --   --   --  269  LABPROT 33.0* 25.5* 29.3*  --   INR 3.26 2.35 2.80  --   HEPARINUNFRC 0.62 0.40  --   --   CREATININE 4.51* 3.10* 4.82*  --     Estimated Creatinine Clearance: 9.8 mL/min (A) (by C-G formula based on SCr of 4.82 mg/dL (H)).   Medications:  Scheduled:  . calcitRIOL  0.25 mcg Oral QODAY  . Chlorhexidine Gluconate Cloth  6 each Topical Q0600  . Chlorhexidine Gluconate Cloth  6 each Topical Q0600  . feeding supplement (GLUCERNA SHAKE)  237 mL Oral TID BM  . feeding supplement (PRO-STAT SUGAR FREE 64)  30 mL Oral BID  . fluconazole  200 mg Oral Daily  . insulin aspart  5 Units Subcutaneous TID WC  . insulin glargine  5 Units Subcutaneous QHS  . metoprolol tartrate  25 mg Oral BID  . multivitamin  1 tablet Oral QHS  . sodium chloride flush  3 mL Intravenous Q12H  . warfarin  2.5 mg Oral ONCE-1800  . Warfarin - Pharmacist Dosing Inpatient   Does not apply q1800   Infusions:  . sodium chloride 0 mL/hr at 04/27/18 1958  . sodium chloride Stopped (04/30/18 0408)  . sodium chloride    . sodium chloride      Assessment: 62 yo F on heparin >> warfarin bridge for PE as diagnosed by VQ scan. Heparin bridge completed day#5/5 overlap 05/02/18. INR is 2.8, therapeutic today, increased 2.35 yesterday. Drug interaction with Fluconazole increasing effect of warfarin INR goal of 2-3.  Patient to be discharged today per MD on  warfarin 2.5mg  daily with plan to check INR Monday 8/26 through home health nurseand thereafter shewe will continue to follow-up with her PCP for monitoring of her INR  Patient also on Fluconazole for fungemia which can significantly increase the INR.   Goal of Therapy:  INR 2-3 Heparin level 0.3-0.7 units/ml Monitor platelets by anticoagulation protocol: Yes   Plan:  Warfarin 2.5mg  today x1 if not discharged today. Plan to discharge on 2.5 mg daily starting today. MD notes plan is to check INR Monday 8/26 through home health nurseand thereafter shewe will continue to follow-up with her PCP for monitoring of her INR -While inpatient we will continue to monitor for any signs/symptoms of bleeding, PT/INR in the a.m.  Warfarin education completed 05/02/18.  Thank you for allowing pharmacy to be part of this patients care team.  Nicole Cella, RPh Clinical Pharmacist Pager: [phone] Please utilize Amion for appropriate phone number to reach the unit pharmacist ([place]) 05/03/2018 12:58 PM

## 2018-05-03 NOTE — Discharge Summary (Signed)
Discharge Summary  Rhonda Lynch QBH:[id number] DOB: 1956/02/21  PCP: Clent Demark, PA-C  Admit date: 04/26/2018 Discharge date: 05/03/2018  Time spent: 15 This is an addendum to discharge summary from May 02, 2018.  Patient was to be discharged but she did not have any way of getting home her son was not available to pick her up.  She underwent hemodialysis today she is being discharged in improved condition  please for details of her discharge summary referred to the discharge notes of May 02, 2018   Recommendations for Outpatient Follow-up:  1. See discharge note of May 02, 2018  Discharge Diagnoses:  Active Hospital Problems   Diagnosis Date Noted  . 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 Hemodialysis catheter infection, initial encounter ([facility])   . Pulmonary embolism ([place]) 04/27/2018  . Sepsis ([place]) 04/26/2018  . Dyspnea 04/26/2018  . Chest pain 04/26/2018  . Tachycardia 04/26/2018  . DM (diabetes mellitus), type 2 with renal complications ([place]) 09/73/5329  . ESRD (end stage renal disease) ([place]) 02/12/2018  . Anemia 02/12/2018    Resolved Hospital Problems  No resolved problems to display.    Discharge Condition: Stable  Diet recommendation: See discharge note  Vitals:   05/03/18 1230 05/03/18 1235  BP: 105/78 103/79  Pulse: (!) 101 94  Resp: (!) 21 20  Temp:  98.9 F (37.2 C)  SpO2: 99% 99%    History of present illness:  Patient is a 84 old female with past medical history of ESRD on dialysis on Tuesday, Thursday and Saturday,ANCA related vasculitis, hypertension, diabetes mellitus who was brought back for admission because of positive blood culture for Candida on last admission. She was recently discharged from here after being treated for pneumonia. Patient was also complaining of some shortness of breath and chest pain at home. Patient admitted for further management. Patient  found to have pulmonary embolism as per the VQ scan.   Started on heparin IV  and warfarin .She was being managed for PE and  Candida fungemia.Cardiology, nephrology and ID were following.  Currently her overall status has significantly improved.  Her INR is already therapeutic and she is only on warfarin now.  She is stable for discharge to home today.  She will follow-up with ophthalmology, infectious disease as an outpatient. Her INR will be checked on 05/05/2018 through home health nurse and thereafter she we will continue to follow-up with her PCP for monitoring of her INR.  Hospital Course:  Principal Problem:   Fungemia Active Problems:   ESRD (end stage renal disease) ([place])   Anemia   DM (diabetes mellitus), type 2 with renal complications ([facility])   Sepsis ([place])   Dyspnea   Chest pain   Tachycardia   Pulmonary embolism ([place])   Hemodialysis catheter infection, initial encounter [facility])   Procedures:  Hemodialysis  Consultations:  Nephrology  Discharge Exam: BP 103/79 (BP Location: Right Arm)   Pulse 94   Temp 98.9 F (37.2 C) (Oral)   Resp 20   Ht 5\' 1"  (1.549 m)   Wt 57.3 kg   SpO2 99%   BMI 23.87 kg/m    General: Pt is alert, awake, not in acute distress Cardiovascular: RRR, S1/S2 +, no rubs, no gallops Respiratory: CTA bilaterally, no wheezing, no rhonchi Abdominal: Soft, NT, ND, bowel sounds + Extremities: no edema, no cyanosis  Discharge Instructions You were cared for by a hospitalist during your hospital stay. If you have any questions about your discharge medications or the care you  received while you were in the hospital after you are discharged, you can call the unit and asked to speak with the hospitalist on call if the hospitalist that took care of you is not available. Once you are discharged, your primary care physician will handle any further medical issues. Please note that NO REFILLS for any discharge medications will be authorized once you are discharged, as it is imperative that you return to your primary care physician (or  establish a relationship with a primary care physician if you do not have one) for your aftercare needs so that they can reassess your need for medications and monitor your lab values.  Discharge Instructions    Diet - low sodium heart healthy   Complete by:  As directed    Diet - low sodium heart healthy   Complete by:  As directed    Renal /hemodialysis diet   Discharge instructions   Complete by:  As directed    1) Check INR on  05/05/18. You have been started on Coumadin and you may need to be on this medication for life.  When you are in Coumadin ,you need to check your INR regularly by following up at your PCPs office.  Your INR is typically maintained between 2-3. 2) take other prescribed medications as instructed. 3)Please follow-up with Dr. Prudencio Burly, ophthalmology, as soon as possible in a week.  Name and number of the provider has been attached. 4)Follow up with infectious disease as an outpatient in 2 weeks.  Name and number of the provider has been attached.   Increase activity slowly   Complete by:  As directed    Increase activity slowly   Complete by:  As directed      Allergies as of 05/03/2018   No Known Allergies     Medication List    STOP taking these medications   amoxicillin-clavulanate 500-125 MG tablet Commonly known as:  AUGMENTIN   ceFAZolin 2 g in dextrose 5 % 100 mL ivpb     TAKE these medications   acetaminophen 325 MG tablet Commonly known as:  TYLENOL Take 2 tablets (650 mg total) by mouth every 6 (six) hours as needed for mild pain (or Fever >/= 101).   calcitRIOL 0.25 MCG capsule Commonly known as:  ROCALTROL Take 1 capsule (0.25 mcg total) by mouth every other day.   feeding supplement (GLUCERNA SHAKE) Liqd Take 237 mLs by mouth 2 (two) times daily between meals.   fluconazole 200 MG tablet Commonly known as:  DIFLUCAN Take 1 tablet (200 mg total) by mouth daily.   insulin aspart 100 UNIT/ML injection Commonly known as:  novoLOG Inject  0-9 Units into the skin 3 (three) times daily with meals. What changed:  how much to take   insulin glargine 100 UNIT/ML injection Commonly known as:  LANTUS Inject 0.05 mLs (5 Units total) into the skin at bedtime.   metoprolol tartrate 25 MG tablet Commonly known as:  LOPRESSOR Take 1 tablet (25 mg total) by mouth 2 (two) times daily. What changed:  how much to take   multivitamin Tabs tablet Take 1 tablet by mouth at bedtime.   ondansetron 4 MG tablet Commonly known as:  ZOFRAN Take 1 tablet (4 mg total) by mouth every 6 (six) hours as needed for nausea.   warfarin 2.5 MG tablet Commonly known as:  COUMADIN Take 1 tablet (2.5 mg total) by mouth daily at 6 PM.   zolpidem 5 MG tablet Commonly known as:  AMBIEN Take 1 tablet (5 mg total) by mouth at bedtime as needed for sleep.      No Known Allergies Follow-up Information    Katy Apo, MD. Schedule an appointment as soon as possible for a visit in 1 week(s).   Specialty:  Ophthalmology Contact information: [place] [address] [phone]        [place]. Go on 05/05/2018.   Specialty:  Family Medicine Why:  Pt needs regular monitoring of INR.  Patient is new on warfarin. INR needs checking 05/05/2018. Contact information: [place] [address] [phone]       Michel Bickers, MD. Schedule an appointment as soon as possible for a visit in 2 week(s).   Specialty:  Infectious Diseases Contact information: [address] [place] [address] [phone]            The results of significant diagnostics from this hospitalization (including imaging, microbiology, ancillary and laboratory) are listed below for reference.    Significant Diagnostic Studies: Dg Chest 2 View  Result Date: 04/26/2018 CLINICAL DATA:  Chest pain altered mental status and sepsis. EXAM: CHEST - 2 VIEW COMPARISON:  04/17/2018 and  prior radiographs FINDINGS: New RIGHT LOWER lung airspace disease/consolidation likely represents pneumonia. Mild cardiomegaly and RIGHT central venous catheter again noted. No definite pleural effusion or pneumothorax. The LEFT lung is clear. IMPRESSION: RIGHT LOWER lung airspace disease/consolidation likely representing pneumonia. Electronically Signed   By: Margarette Canada M.D.   On: 04/26/2018 18:34   Dg Chest 2 View  Result Date: 04/17/2018 CLINICAL DATA:  Right-sided rib cage pain x1 day. EXAM: CHEST - 2 VIEW COMPARISON:  04/12/2018 FINDINGS: Cardiomegaly is noted. No alveolar consolidation is seen. Improved aeration of the lungs is identified with interval decrease in alveolar airspace opacities bilaterally. No new pulmonary consolidation nor overt pulmonary edema is visualized. No effusion or pneumothorax. Nonaneurysmal atherosclerotic aorta is noted. Right IJ dialysis catheter tip terminates at the cavoatrial junction, stable in appearance. Cholecystectomy clips are present in the right upper quadrant. 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 IMPRESSION: Stable cardiomegaly with aortic atherosclerosis. Interval clearing of pulmonary edema. No new alveolar consolidations are noted. Electronically Signed   By: Ashley Royalty M.D.   On: 04/17/2018 22:43   Ct Abdomen Pelvis W Contrast  Result Date: 04/19/2018 CLINICAL DATA:  Acute onset of right upper quadrant abdominal pain. Elevated bilirubin and alkaline phosphatase. EXAM: CT ABDOMEN AND PELVIS WITH CONTRAST TECHNIQUE: Multidetector CT imaging of the abdomen and pelvis was performed using the standard protocol following bolus administration of intravenous contrast. CONTRAST:  123mL OMNIPAQUE IOHEXOL 300 MG/ML  SOLN COMPARISON:  CT of the abdomen and pelvis from 04/17/2018 FINDINGS: Lower chest: There is new consolidation of the right lower lobe, compatible with pneumonia. Mild left basilar atelectasis is noted. The visualized portions of the  mediastinum are unremarkable. Hepatobiliary: Mild prominence of the intrahepatic biliary ducts appears relatively stable and likely reflects prior cholecystectomy. The common bile duct is normal in caliber status post cholecystectomy. Clips are noted at the gallbladder fossa. The liver is otherwise unremarkable in appearance. Pancreas: The pancreas is within normal limits. Spleen: A nonspecific 1.4 cm hypodensity is noted within the spleen; a smaller better defined 8 mm cyst is also seen. Adrenals/Urinary Tract: The adrenal glands are unremarkable in appearance. The kidneys are within normal limits. There is no evidence of hydronephrosis. No renal or ureteral stones are identified. Mild  nonspecific left-sided perinephric stranding is seen. Stomach/Bowel: The stomach is unremarkable in appearance. The small bowel is within normal limits. The appendix is normal in caliber, without evidence of appendicitis. The colon is unremarkable in appearance. Vascular/Lymphatic: Scattered calcification is seen along the abdominal aorta and its branches. The abdominal aorta is otherwise grossly unremarkable. The inferior vena cava is grossly unremarkable. No retroperitoneal lymphadenopathy is seen. No pelvic sidewall lymphadenopathy is identified. Reproductive: The bladder is mildly distended and grossly unremarkable. The patient is status post hysterectomy. No suspicious adnexal masses are seen. Other: No additional soft tissue abnormalities are seen. Musculoskeletal: No acute osseous abnormalities are identified. The visualized musculature is unremarkable in appearance. IMPRESSION: 1. New consolidation of the right lower lung lobe, compatible with pneumonia. 2. Status post cholecystectomy. Biliary tree is grossly unremarkable in appearance. No focal abnormalities identified at the right upper quadrant at this time. 3. Nonspecific 1.4 cm hypodensity within the spleen; smaller better defined 8 mm cyst also seen. Aortic  Atherosclerosis (ICD10-I70.0). Electronically Signed   By: Garald Balding M.D.   On: 04/19/2018 23:40   Nm Pulmonary Perf And Vent  Result Date: 04/27/2018 CLINICAL DATA:  Shortness of breath EXAM: NUCLEAR MEDICINE VENTILATION - PERFUSION LUNG SCAN VIEWS: Anterior, posterior, left lateral, right lateral, RAO, LAO, RPO, LPO-ventilation perfusion RADIOPHARMACEUTICALS:  32.3 mCi of Tc-77m DTPA aerosol inhalation and 4.2 mCi Tc4m-MAA IV COMPARISON:  Ventilation perfusion lung scans March 20, 2018; chest radiograph April 26, 2018 FINDINGS: Ventilation: The distribution of uptake on the ventilation study is homogeneous and symmetric without appreciable change from prior study. Perfusion: There is a focal perfusion defect in the superior segment of the left lower lobe, not present on prior lung scan examination. There is also a perfusion defect in the anterior segment of the left lower lobe, not present on prior lung scan examination. There are no corresponding ventilation defects or left lower lobe chest radiographic abnormality. No perfusion defects are noted elsewhere. IMPRESSION: There are 2 segmental level perfusion defects in the left lower lobe which were not present on prior lung scan and are not appreciable on the ventilation study. This study therefore constitutes a high probability of pulmonary embolus based on PIOPED II criteria. Critical Value/emergent results were called by telephone at the time of interpretation on 04/27/2018 at 2:37 pm to Dr. Shelly Coss , who verbally acknowledged these results. Electronically Signed   By: Lowella Grip III M.D.   On: 04/27/2018 14:38   Ir Removal Tun Cv Cath W/o Fl  Result Date: 04/27/2018 INDICATION: 62 year old with end-stage renal disease and tunneled dialysis catheter. Patient has a Candida infection and needs a removal of the dialysis catheter. EXAM: REMOVAL TUNNELED DIALYSIS CATHETER MEDICATIONS: None ANESTHESIA/SEDATION: None FLUOROSCOPY TIME:  None  COMPLICATIONS: None immediate. PROCEDURE: Informed written consent was obtained from the patient after a thorough discussion of the procedural risks, benefits and alternatives. All questions were addressed. Maximal Sterile Barrier Technique was utilized including caps, mask, sterile gowns, sterile gloves, sterile drape, hand hygiene and skin antiseptic. A timeout was performed prior to the initiation of the procedure. [name] right chest and catheter was prepped and draped in a normal sterile fashion. Heparin was removed from both ports of catheter. 1% lidocaine was used for local anesthesia. Using gentle blunt dissection the cuff of the catheter was exposed and the catheter was removed in it's entirety. Pressure was held till hemostasis was obtained. A sterile dressing was applied. In addition, the patient had 2 sutures in the right  lower neck which were both removed. The patient tolerated the procedure well with no immediate complications. IMPRESSION: Successful catheter removal as described above. Electronically Signed   By: Markus Daft M.D.   On: 04/27/2018 13:02   Dg Chest Port 1 View  Result Date: 04/29/2018 CLINICAL DATA:  End-stage renal disease. EXAM: PORTABLE CHEST 1 VIEW COMPARISON:  04/26/2018. FINDINGS: Dual-lumen right IJ line noted with tip over right atrium. Cardiomegaly with normal pulmonary vascularity. Right base atelectasis/infiltrate unchanged. No pleural effusion or pneumothorax. IMPRESSION: 1.  Dual-lumen right IJ catheter noted with tip over right atrium. 2.  Cardiomegaly.  Normal pulmonary vascularity. 3. Persistent right base infiltrate. No interim change. Continued follow-up exams to demonstrate clearing. Electronically Signed   By: Marcello Moores  Register   On: 04/29/2018 14:35   Dg Chest Port 1 View  Result Date: 04/12/2018 CLINICAL DATA:  Shortness of breath. End-stage renal disease on dialysis. EXAM: PORTABLE CHEST 1 VIEW COMPARISON:  04/03/2018 FINDINGS: Right jugular central  venous dialysis catheter remains in appropriate position. Stable mild cardiomegaly. Diffuse mixed interstitial and airspace disease shows no significant change, suspicious for pulmonary edema. No evidence of superimposed pulmonary consolidation. IMPRESSION: Stable diffuse mixed interstitial and airspace disease, suspicious for diffuse pulmonary edema. Stable cardiomegaly. Electronically Signed   By: Earle Gell M.D.   On: 04/12/2018 11:33   Dg Fluoro Guide Cv Line-no Report  Result Date: 04/29/2018 Fluoroscopy was utilized by the requesting physician.  No radiographic interpretation.   Ct Renal Stone Study  Result Date: 04/17/2018 CLINICAL DATA:  62 y/o  F; flank pain. EXAM: CT ABDOMEN AND PELVIS WITHOUT CONTRAST TECHNIQUE: Multidetector CT imaging of the abdomen and pelvis was performed following the standard protocol without IV contrast. COMPARISON:  03/19/2018 CT abdomen and pelvis. FINDINGS: Lower chest: Near complete resolution of ground-glass opacities in the lung bases. Hepatobiliary: No focal liver abnormality is seen. Status post cholecystectomy. No biliary dilatation. Pancreas: Unremarkable. No pancreatic ductal dilatation or surrounding inflammatory changes. Spleen: Normal in size without focal abnormality. Adrenals/Urinary Tract: Stable left perinephric fat stranding. Normal adrenal glands. No hydronephrosis or urinary stone disease. Normal bladder. Stomach/Bowel: Stomach is within normal limits. Appendix appears normal. No evidence of bowel wall thickening, distention, or inflammatory changes. Vascular/Lymphatic: Aortic atherosclerosis. No enlarged abdominal or pelvic lymph nodes. 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 Other: No abdominal wall hernia or abnormality. No abdominopelvic ascites. Musculoskeletal: No fracture is seen. Lumbar spondylosis with L4-5 grade 1 anterolisthesis and prominent lower lumbar facet arthrosis. IMPRESSION: 1. No acute process identified. No hydronephrosis  or urinary stone disease. 2. Near complete resolution of ground-glass opacities at the lung bases. 3. Stable mild left perinephric fat stranding. 4. 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 Electronically Signed   By: Kristine Garbe M.D.   On: 04/17/2018 23:10    Microbiology: Recent Results (from the past 240 hour(s))  Culture, blood (routine x 2)     Status: Abnormal   Collection Time: 04/23/18  5:00 PM  Result Value Ref Range Status   Specimen Description BLOOD RIGHT ANTECUBITAL  Final   Special Requests   Final    BOTTLES DRAWN AEROBIC AND ANAEROBIC Blood Culture adequate volume   Culture  Setup Time   Final    YEAST AEROBIC BOTTLE ONLY CRITICAL RESULT CALLED TO, READ BACK BY AND VERIFIED WITH: DR Sloan Leiter [id number] Performed at [facility], [place] [address], [place], [place] [address]    Culture CANDIDA PARAPSILOSIS (A)  Final   Report  Status 04/27/2018 FINAL  Final  Blood Culture ID Panel (Reflexed)     Status: Abnormal   Collection Time: 04/23/18  5:00 PM  Result Value Ref Range Status   Enterococcus species NOT DETECTED NOT DETECTED Final   Listeria monocytogenes NOT DETECTED NOT DETECTED Final   Staphylococcus species NOT DETECTED NOT DETECTED Final   Staphylococcus aureus NOT DETECTED NOT DETECTED Final   Streptococcus species NOT DETECTED NOT DETECTED Final   Streptococcus agalactiae NOT DETECTED NOT DETECTED Final   Streptococcus pneumoniae NOT DETECTED NOT DETECTED Final   Streptococcus pyogenes NOT DETECTED NOT DETECTED Final   Acinetobacter baumannii NOT DETECTED NOT DETECTED Final   Enterobacteriaceae species NOT DETECTED NOT DETECTED Final   Enterobacter cloacae complex NOT DETECTED NOT DETECTED Final   Escherichia coli NOT DETECTED NOT DETECTED Final   Klebsiella oxytoca NOT DETECTED NOT DETECTED Final   Klebsiella pneumoniae NOT DETECTED NOT DETECTED Final   Proteus species NOT DETECTED NOT DETECTED Final   Serratia marcescens NOT DETECTED NOT  DETECTED Final   Haemophilus influenzae NOT DETECTED NOT DETECTED Final   Neisseria meningitidis NOT DETECTED NOT DETECTED Final   Pseudomonas aeruginosa NOT DETECTED NOT DETECTED Final   Candida albicans NOT DETECTED NOT DETECTED Final   Candida glabrata NOT DETECTED NOT DETECTED Final   Candida krusei NOT DETECTED NOT DETECTED Final   Candida parapsilosis DETECTED (A) NOT DETECTED Final    Comment: CRITICAL RESULT CALLED TO, READ BACK BY AND VERIFIED WITH: DR Sloan Leiter [id number]    Candida tropicalis NOT DETECTED NOT DETECTED Final    Comment: Performed at [facility], [address] [place], [place] [address]  Culture, blood (routine x 2)     Status: None   Collection Time: 04/23/18  5:15 PM  Result Value Ref Range Status   Specimen Description BLOOD RIGHT FOREARM  Final   Special Requests   Final    BOTTLES DRAWN AEROBIC AND ANAEROBIC Blood Culture adequate volume   Culture   Final    NO GROWTH 5 DAYS Performed at [facility], [place] [address], [place], [place] [address]    Report Status 04/28/2018 FINAL  Final  Blood culture (routine x 2)     Status: None   Collection Time: 04/26/18  4:58 PM  Result Value Ref Range Status   Specimen Description BLOOD RIGHT HAND  Final   Special Requests   Final    BOTTLES DRAWN AEROBIC ONLY Blood Culture results may not be optimal due to an inadequate volume of blood received in culture bottles   Culture   Final    NO GROWTH 5 DAYS Performed at [facility], [place] [address], [place], [place] [address]    Report Status 05/01/2018 FINAL  Final  Blood culture (routine x 2)     Status: None   Collection Time: 04/26/18  6:30 PM  Result Value Ref Range Status   Specimen Description BLOOD RIGHT ANTECUBITAL  Final   Special Requests   Final    BOTTLES DRAWN AEROBIC AND ANAEROBIC Blood Culture adequate volume   Culture   Final    NO GROWTH 5 DAYS Performed at [facility], [place] [address], [place],  [address]     Report Status 05/01/2018 FINAL  Final  MRSA PCR Screening     Status: None   Collection Time: 04/26/18  8:37 PM  Result Value Ref Range Status   MRSA by PCR NEGATIVE NEGATIVE Final  Comment:        The GeneXpert MRSA Assay (FDA approved for NASAL specimens only), is one component of a comprehensive MRSA colonization surveillance program. It is not intended to diagnose MRSA infection nor to guide or monitor treatment for MRSA infections. Performed at [facility], [place] [address], [place], [place] [address]   Culture, blood (Routine X 2) w Reflex to ID Panel     Status: None   Collection Time: 04/28/18  8:42 AM  Result Value Ref Range Status   Specimen Description BLOOD RIGHT HAND  Final   Special Requests   Final    BOTTLES DRAWN AEROBIC ONLY Blood Culture adequate volume   Culture   Final    NO GROWTH 5 DAYS Performed at [facility], [place] [address], [place], [place] [address]    Report Status 05/03/2018 FINAL  Final     Labs: Basic Metabolic Panel: Recent Labs  Lab 04/27/18 0706  04/29/18 1038 04/30/18 0459 05/01/18 0319 05/02/18 0626 05/03/18 0527  NA 138   < > 136 136 138 137 136  K 3.0*   < > 4.3 4.7 3.7 3.9 3.8  CL 97*   < > 105 106 101 103 101  CO2 28   < > 23 21* 28 27 25   GLUCOSE 119*   < > 91 81 101* 91 119*  BUN 17   < > 30* 30* 16 7* 14  CREATININE 4.30*   < > 7.21* 6.94* 4.51* 3.10* 4.82*  CALCIUM 7.5*   < > 7.9* 8.0* 8.0* 8.5* 8.5*  MG 1.7  --   --   --   --   --   --   PHOS  --   --  5.2*  --   --   --   --    < > = values in this interval not displayed.   Liver Function Tests: Recent Labs  Lab 04/27/18 0706 04/29/18 1038  AST 28  --   ALT 14  --   ALKPHOS 113  --   BILITOT 0.6  --   PROT 5.3*  --   ALBUMIN 1.6* 1.6*   No results for input(s): LIPASE, AMYLASE in the last 168 hours. No results for input(s): AMMONIA in the last 168 hours. CBC: Recent Labs  Lab 04/28/18 1421 04/29/18 0605 04/29/18 1038 04/30/18 0459  05/03/18 1002  WBC 4.8 5.3 4.8 6.1 6.1  NEUTROABS  --   --   --   --  3.3  HGB 10.2* 9.1* 8.8* 8.8* 9.1*  HCT 33.6* 29.9* 29.4* 29.0* 30.8*  MCV 80.8 79.7 81.2 79.9 83.0  PLT 393 325 365 375 269   Cardiac Enzymes: Recent Labs  Lab 04/26/18 1726 04/27/18 0049 04/27/18 0706  TROPONINI 0.04* 0.07* 0.04*   BNP: BNP (last 3 results) Recent Labs    02/12/18 1534  BNP 225.7*    ProBNP (last 3 results) No results for input(s): PROBNP in the last 8760 hours.  CBG: Recent Labs  Lab 05/01/18 2150 05/02/18 0751 05/02/18 1215 05/02/18 1728 05/02/18 2230  GLUCAP 106* 84 145* 177* 136*   This is an addendum to discharge summary from May 02, 2018.  Patient was to be discharged but she did not have any way of getting home her son was not available to pick her up.  She underwent hemodialysis today she is being discharged in improved condition  please for details of her discharge summary referred to the discharge  notes of May 02, 2018    Signed:  Cristal Deer, MD [facility] 05/03/2018, 4:00 PM

## 2018-05-03 NOTE — Progress Notes (Signed)
Called son with number on chart to let him know we were looking to discharge his mom after her HD treatment.  No answer.  Left message.

## 2018-05-06 ENCOUNTER — Encounter (HOSPITAL_COMMUNITY): Payer: Self-pay | Admitting: Emergency Medicine

## 2018-05-06 ENCOUNTER — Other Ambulatory Visit: Payer: Self-pay | Admitting: *Deleted

## 2018-05-06 ENCOUNTER — Other Ambulatory Visit: Payer: Self-pay

## 2018-05-06 ENCOUNTER — Emergency Department (HOSPITAL_COMMUNITY)
Admission: EM | Admit: 2018-05-06 | Discharge: 2018-05-06 | Disposition: A | Discharge status: Home / Self Care | Payer: Medicaid Other | Attending: Emergency Medicine | Admitting: Emergency Medicine

## 2018-05-06 ENCOUNTER — Emergency Department (HOSPITAL_COMMUNITY): Payer: Medicaid Other

## 2018-05-06 DIAGNOSIS — Z992 Dependence on renal dialysis: Secondary | ICD-10-CM | POA: Insufficient documentation

## 2018-05-06 DIAGNOSIS — Z87891 Personal history of nicotine dependence: Secondary | ICD-10-CM | POA: Diagnosis not present

## 2018-05-06 DIAGNOSIS — R109 Unspecified abdominal pain: Secondary | ICD-10-CM

## 2018-05-06 DIAGNOSIS — E1122 Type 2 diabetes mellitus with diabetic chronic kidney disease: Secondary | ICD-10-CM | POA: Diagnosis not present

## 2018-05-06 DIAGNOSIS — Z79899 Other long term (current) drug therapy: Secondary | ICD-10-CM | POA: Insufficient documentation

## 2018-05-06 DIAGNOSIS — R197 Diarrhea, unspecified: Secondary | ICD-10-CM | POA: Diagnosis not present

## 2018-05-06 DIAGNOSIS — N186 End stage renal disease: Secondary | ICD-10-CM | POA: Diagnosis not present

## 2018-05-06 DIAGNOSIS — Z794 Long term (current) use of insulin: Secondary | ICD-10-CM | POA: Diagnosis not present

## 2018-05-06 DIAGNOSIS — Z7901 Long term (current) use of anticoagulants: Secondary | ICD-10-CM | POA: Diagnosis not present

## 2018-05-06 DIAGNOSIS — I120 Hypertensive chronic kidney disease with stage 5 chronic kidney disease or end stage renal disease: Secondary | ICD-10-CM | POA: Diagnosis not present

## 2018-05-06 DIAGNOSIS — R1011 Right upper quadrant pain: Secondary | ICD-10-CM | POA: Diagnosis present

## 2018-05-06 LAB — CBC
HCT: 34.9 % — ABNORMAL LOW (ref 36.0–46.0)
Hemoglobin: 10.3 g/dL — ABNORMAL LOW (ref 12.0–15.0)
MCH: 24.1 pg — AB (ref 26.0–34.0)
MCHC: 29.5 g/dL — AB (ref 30.0–36.0)
MCV: 81.7 fL (ref 78.0–100.0)
PLATELETS: 257 10*3/uL (ref 150–400)
RBC: 4.27 MIL/uL (ref 3.87–5.11)
RDW: 21.6 % — AB (ref 11.5–15.5)
WBC: 8.4 10*3/uL (ref 4.0–10.5)

## 2018-05-06 LAB — DIFFERENTIAL
Basophils Absolute: 0 10*3/uL (ref 0.0–0.1)
Basophils Relative: 0 %
Eosinophils Absolute: 0 10*3/uL (ref 0.0–0.7)
Eosinophils Relative: 0 %
Lymphocytes Relative: 15 %
Lymphs Abs: 1.3 10*3/uL (ref 0.7–4.0)
Monocytes Absolute: 1 10*3/uL (ref 0.1–1.0)
Monocytes Relative: 12 %
Neutro Abs: 6.1 10*3/uL (ref 1.7–7.7)
Neutrophils Relative %: 73 %

## 2018-05-06 LAB — COMPREHENSIVE METABOLIC PANEL
ALK PHOS: 157 U/L — AB (ref 38–126)
ALT: 21 U/L (ref 0–44)
ANION GAP: 15 (ref 5–15)
AST: 55 U/L — ABNORMAL HIGH (ref 15–41)
Albumin: 2.4 g/dL — ABNORMAL LOW (ref 3.5–5.0)
BILIRUBIN TOTAL: 0.8 mg/dL (ref 0.3–1.2)
BUN: 15 mg/dL (ref 8–23)
CALCIUM: 8.1 mg/dL — AB (ref 8.9–10.3)
CO2: 28 mmol/L (ref 22–32)
Chloride: 95 mmol/L — ABNORMAL LOW (ref 98–111)
Creatinine, Ser: 3.67 mg/dL — ABNORMAL HIGH (ref 0.44–1.00)
GFR, EST AFRICAN AMERICAN: 14 mL/min — AB (ref >60)
GFR, EST NON AFRICAN AMERICAN: 12 mL/min — AB (ref >60)
Glucose, Bld: 222 mg/dL — ABNORMAL HIGH (ref 70–99)
Potassium: 4 mmol/L (ref 3.5–5.1)
SODIUM: 138 mmol/L (ref 135–145)
TOTAL PROTEIN: 6.9 g/dL (ref 6.5–8.1)

## 2018-05-06 LAB — URINALYSIS, ROUTINE W REFLEX MICROSCOPIC
Bacteria, UA: NONE SEEN
Bilirubin Urine: NEGATIVE
Glucose, UA: 50 mg/dL — AB
Ketones, ur: NEGATIVE mg/dL
Nitrite: NEGATIVE
PH: 6 (ref 5.0–8.0)
Protein, ur: >=300 mg/dL — AB
RBC / HPF: >50 RBC/hpf — ABNORMAL HIGH (ref 0–5)
SPECIFIC GRAVITY, URINE: 1.017 (ref 1.005–1.030)
WBC, UA: >50 WBC/hpf — ABNORMAL HIGH (ref 0–5)

## 2018-05-06 LAB — LIPASE, BLOOD: Lipase: 35 U/L (ref 11–51)

## 2018-05-06 MED ORDER — LIDOCAINE 5 % EX PTCH: 1.0000 | MEDICATED_PATCH | CUTANEOUS | 0 refills | Status: AC

## 2018-05-06 MED ORDER — LIDOCAINE 5 % EX PTCH
1.0000 | MEDICATED_PATCH | CUTANEOUS | Status: DC
  Administered 2018-05-06: 1 via TRANSDERMAL
  Filled 2018-05-06 (×2): qty 1

## 2018-05-06 NOTE — ED Notes (Signed)
pts ride is waiting outside, she is disabled ans unable to come inside. Please call when patient is being D/C [phone]

## 2018-05-06 NOTE — ED Provider Notes (Signed)
[facility] EMERGENCY DEPARTMENT Provider Note   CSN: [id number] Arrival date & time: 05/06/18  1507     History   Chief Complaint Chief Complaint  Patient presents with  . Abdominal Pain    HPI Rhonda Lynch is a 62 y.o. female w/ h/o ESRD on dialysis TThSa, HTN, substance abuse, recent admission for HCAP, PE on coumadin, here for evaluation of diarrhea sudden onset today while at dialysis. Patient could only stay for 3 out of 4 hours due to diarrhea that she "couldn't control". Diarrhea x 2, watery, non bloody non melenotic. Associated with right flank pain that she has had for 2-3 weeks, states she was admitted for this recently.  Has a mild cough with intermittent thick white sputum.   No fevers, chills, nausea, vomiting, dysuria, hematuria, CP, SOB.  H/o cholecystectomy.  No ETOH use. Last cocaine/marijuana use 5 months ago.   HPI  Past Medical History:  Diagnosis Date  . Chronic kidney disease   . ESRD (end stage renal disease) ([place])   . Hypertension   . Oxygen deficiency    2 L at night  . Substance abuse ([place])    has been clean for 4 months     Patient Active Problem List   Diagnosis Date Noted  . Hemodialysis catheter infection, initial encounter ([place])   . 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 Pulmonary embolism ([place]) 04/27/2018  . Sepsis ([place]) 04/26/2018  . Dyspnea 04/26/2018  . Chest pain 04/26/2018  . Tachycardia 04/26/2018  . Abdominal pain 04/18/2018  . DM (diabetes mellitus), type 2 with renal complications ([place]) 38/25/0539  . Right flank pain 04/18/2018  . Acute respiratory failure with hypoxia ([place]) 04/12/2018  . SVT (supraventricular tachycardia) ([place])   . Chronic renal failure   . Diabetes ([place]) 04/02/2018  . Hypertension   . Noncompliance of patient with renal dialysis ([place])   . Acute pulmonary edema ([facility])   . Elevated troponin 03/19/2018  . AKI (acute kidney injury) ([place])   . Fever   . 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 Diffuse pulmonary  alveolar hemorrhage   . Hypoxemia   . Pulmonary infiltrate   . 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 ESRD (end stage renal disease) ([place]) 02/12/2018  . Acute respiratory failure ([place]) 02/12/2018  . Alcohol abuse 02/12/2018  . Anemia 02/12/2018  . Cocaine abuse ([place]) 02/12/2018  . Epigastric pain 02/12/2018  . Pneumonia 02/12/2018  . TOBACCO ABUSE 03/08/2010  . PNEUMONIA 03/08/2010  . UTI 03/08/2010  . ELEVATED BLOOD PRESSURE WITHOUT DIAGNOSIS OF HYPERTENSION 03/08/2010    Past Surgical History:  Procedure Laterality Date  . AV FISTULA PLACEMENT Left 03/05/2018   Procedure: ARTERIOVENOUS (AV) FISTULA CREATION  LEFT UPPER EXTREMITY;  Surgeon: [name], MD;  Location: [facility];  Service: Vascular;  Laterality: Left;  . HEMATOMA EVACUATION Left 03/06/2018   Procedure: EVACUATION HEMATOMA LEFT ARM;  Surgeon: Angelia Mould, MD;  Location: [facility];  Service: Vascular;  Laterality: Left;  . INSERTION OF DIALYSIS CATHETER Left 04/29/2018   Procedure: INSERTION OF DIALYSIS CATHETER LEFT INTERNAL JUGULAR;  Surgeon: Angelia Mould, MD;  Location: [facility];  Service: Vascular;  Laterality: Left;  . IR FLUORO GUIDE CV LINE RIGHT  02/18/2018  . IR FLUORO GUIDE CV LINE RIGHT  02/26/2018  . IR REMOVAL TUN CV CATH W/O FL  04/27/2018  . IR US GUIDE VASC ACCESS RIGHT  02/18/2018  . TEE WITHOUT CARDIOVERSION N/A 04/29/2018  Procedure: TRANSESOPHAGEAL ECHOCARDIOGRAM (TEE);  Surgeon: Sueanne Margarita, MD;  Location: [facility] ENDOSCOPY;  Service: Cardiovascular;  Laterality: N/A;     OB History   None      Home Medications    Prior to Admission medications   Medication Sig Start Date End Date Taking? Authorizing Provider  acetaminophen (TYLENOL) 325 MG tablet Take 2 tablets (650 mg total) by mouth every 6 (six) hours as needed for mild pain (or Fever >/= 101). 04/24/18   Ghimire, Henreitta Leber, MD  calcitRIOL (ROCALTROL) 0.25 MCG capsule Take 1 capsule (0.25 mcg total) by  mouth every other day. 04/14/18   Nita Sells, MD  feeding supplement, GLUCERNA SHAKE, (GLUCERNA SHAKE) LIQD Take 237 mLs by mouth 2 (two) times daily between meals. Patient not taking: Reported on 04/26/2018 04/24/18   Jonetta Osgood, MD  fluconazole (DIFLUCAN) 200 MG tablet Take 1 tablet (200 mg total) by mouth daily. 05/02/18   Shelly Coss, MD  insulin aspart (NOVOLOG) 100 UNIT/ML injection Inject 0-9 Units into the skin 3 (three) times daily with meals. Patient taking differently: Inject 5 Units into the skin 3 (three) times daily with meals.  04/13/18   Nita Sells, MD  insulin glargine (LANTUS) 100 UNIT/ML injection Inject 0.05 mLs (5 Units total) into the skin at bedtime. 04/13/18   Nita Sells, MD  lidocaine (LIDODERM) 5 % Place 1 patch onto the skin daily for 5 days. Apply to right flank. Change every 24 hours. 05/06/18 05/11/18  Kinnie Feil, PA-C  metoprolol tartrate (LOPRESSOR) 25 MG tablet Take 1 tablet (25 mg total) by mouth 2 (two) times daily. 05/02/18   Shelly Coss, MD  multivitamin (RENA-VIT) TABS tablet Take 1 tablet by mouth at bedtime. 04/13/18   Nita Sells, MD  ondansetron (ZOFRAN) 4 MG tablet Take 1 tablet (4 mg total) by mouth every 6 (six) hours as needed for nausea. 04/13/18   Nita Sells, MD  warfarin (COUMADIN) 2.5 MG tablet Take 1 tablet (2.5 mg total) by mouth daily at 6 PM. 05/03/18   Shelly Coss, MD  zolpidem (AMBIEN) 5 MG tablet Take 1 tablet (5 mg total) by mouth at bedtime as needed for sleep. 04/13/18   Nita Sells, MD    Family History Family History  Problem Relation Age of Onset  . Autoimmune disease Neg Hx     Social History Social History   Tobacco Use  . Smoking status: Former Smoker    Packs/day: 0.50    Last attempt to quit: 04/08/2018    Years since quitting: 0.0  . Smokeless tobacco: Never Used  . Tobacco comment: trying to quit 04/02/2018  Substance Use Topics  . Alcohol use: Yes  .  Drug use: Yes    Types: Cocaine     Allergies   Patient has no known allergies.   Review of Systems Review of Systems  Gastrointestinal: Positive for diarrhea.  Genitourinary: Positive for flank pain.  All other systems reviewed and are negative.    Physical Exam Updated Vital Signs BP 125/87 (BP Location: Right Arm)   Pulse 87   Temp 98.1 F (36.7 C) (Oral)   Resp 20   Ht 5\' 1"  (1.549 m)   Wt 59.4 kg   SpO2 97%   BMI 24.75 kg/m   Physical Exam  Constitutional: She is oriented to person, place, and time. She appears well-developed and well-nourished. No distress.  NAD.  HENT:  Head: Normocephalic and atraumatic.  Right Ear: External ear normal.  Left  Ear: External ear normal.  Nose: Nose normal.  Eyes: Conjunctivae and EOM are normal.  Neck: Normal range of motion. Neck supple.  Cardiovascular: Normal rate, regular rhythm and normal heart sounds.  Pulmonary/Chest: Effort normal and breath sounds normal.  Abdominal: Soft. Bowel sounds are normal. There is tenderness. There is CVA tenderness (right).  RUQ surgical scar noted, well healed. No G/R/R. No suprapubic tenderness. Negative murphys and mcburneys.   Musculoskeletal: Normal range of motion. She exhibits no deformity.  Neurological: She is alert and oriented to person, place, and time.  Skin: Skin is warm and dry. Capillary refill takes less than 2 seconds.  Psychiatric: She has a normal mood and affect. Her behavior is normal. Judgment and thought content normal.  Nursing note and vitals reviewed.    ED Treatments / Results  Labs (all labs ordered are listed, but only abnormal results are displayed) Labs Reviewed  COMPREHENSIVE METABOLIC PANEL - Abnormal; Notable for the following components:      Result Value   Chloride 95 (*)    Glucose, Bld 222 (*)    Creatinine, Ser 3.67 (*)    Calcium 8.1 (*)    Albumin 2.4 (*)    AST 55 (*)    Alkaline Phosphatase 157 (*)    GFR calc non Af Amer 12 (*)     GFR calc Af Amer 14 (*)    All other components within normal limits  CBC - Abnormal; Notable for the following components:   Hemoglobin 10.3 (*)    HCT 34.9 (*)    MCH 24.1 (*)    MCHC 29.5 (*)    RDW 21.6 (*)    All other components within normal limits  URINALYSIS, ROUTINE W REFLEX MICROSCOPIC - Abnormal; Notable for the following components:   Color, Urine AMBER (*)    APPearance HAZY (*)    Glucose, UA 50 (*)    Hgb urine dipstick LARGE (*)    Protein, ur >=300 (*)    Leukocytes, UA MODERATE (*)    RBC / HPF >50 (*)    WBC, UA >50 (*)    Non Squamous Epithelial 0-5 (*)    All other components within normal limits  URINE CULTURE  GASTROINTESTINAL PANEL BY PCR, STOOL (REPLACES STOOL CULTURE)  LIPASE, BLOOD  DIFFERENTIAL    EKG None  Radiology Dg Chest 2 View  Result Date: 05/06/2018 CLINICAL DATA:  history of ESRD on dialysis Tuesday Thursday Saturday notes right-sided abdominal pain began this morning and worsened after dialysis. States she could only tolerate half of the treatment. Notes 2 episodes of watery nonbloody diarrhea. Shortness of breath and cough. Nausea. EXAM: CHEST - 2 VIEW COMPARISON:  04/29/2018 FINDINGS: Patient has a RIGHT dialysis catheter, tips overlying the RIGHT atrium. The heart is mildly enlarged. There is patchy infiltrate in the LATERAL RIGHT lung base which partially obscures the hemidiaphragm and is consistent with infectious process. No pulmonary edema. No pneumothorax. IMPRESSION: 1. Cardiomegaly without pulmonary edema. 2. RIGHT LOWER lobe infiltrate. Followup PA and lateral chest X-ray is recommended in 3-4 weeks following trial of antibiotic therapy to ensure resolution and exclude underlying malignancy. Electronically Signed   By: Nolon Nations M.D.   On: 05/06/2018 15:50    Procedures Procedures (including critical care time)  Medications Ordered in ED Medications  lidocaine (LIDODERM) 5 % 1 patch (1 patch Transdermal Patch Applied  05/06/18 1951)     Initial Impression / Assessment and Plan / ED Course  I have  reviewed the triage vital signs and the nursing notes.  Pertinent labs & imaging results that were available during my care of the patient were reviewed by me and considered in my medical decision making (see chart for details).  Clinical Course as of May 07 2215  Tue May 06, 2018  1848 Hemoglobin(!): 10.3 [CG]  1848 HCT(!): 34.9 [CG]  1848 Creatinine(!): 3.67 [CG]  1848 Alkaline Phosphatase(!): 157 [CG]  1848 GFR, Est African American(!): 14 [CG]  1848 IMPRESSION: 1. Cardiomegaly without pulmonary edema. 2. RIGHT LOWER lobe infiltrate. Followup PA and lateral chest X-ray is recommended in 3-4 weeks following trial of antibiotic therapy to ensure resolution and exclude underlying malignancy.    DG Chest 2 View [CG]  1921 Hgb urine dipstick(!): LARGE [CG]  1921 Leukocytes, UA(!): MODERATE [CG]  1921 WBC, UA(!): >50 [CG]  1921 RBC / HPF(!): >50 [CG]  1921 Bacteria, UA: NONE SEEN [CG]  2105 Reevaluated patient.  States the Lidoderm patch has significantly improved her right flank pain.  She is eating.  She denies abdominal pain, nausea, vomiting.  No episodes of diarrhea in the ER.   [CG]    Clinical Course User Index [CG] Kinnie Feil, PA-C   62 y.o. yo female here with non bloody watery diarrhea x 2. No vomiting or fever.  Suspect this is viral in nature.    No recent travel, outdoor activity, uncooked meat or unfiltered water.  Recent antibiotics for HCAP.  Vital signs within normal limits.  Patient is non toxic appearing, does not appear to be volume depleted.  Non tender abdomen.  Lab work at her baseline. UA with large hgb, moderate leuks but no bacteria seen, urine always appears dirty.  She ha sno UTI symptoms, will defer abx at this time and send urine for cx.  Considered c.diff given recent abx however she has only had 2 BM today and otherwise feels well. Pt tolerated PO in ER.  In regards  to her R flank pain, this appears to be chronic for almost 3 weeks beginning when she was first diagnosed with RLL PNA on 8/82018.  She does have R CVAT however this also presents over 3 weeks ago. Pt states this is the pain she has been "admitted for" in the past.  Recent admission for PE (on warfarin) and pneumonia. Pt has no CP, SOB, constitutional symptoms today and her abx were discontinued for PNA at last admission. Infiltrate seen on today cxr likely from previous dx of PNA earlier this month.  She is w/o PNA symptoms. Pain resolved after lidocaine patch in ER.  Patient will be discharged with instructions to hydrate and conservative therapies for diarrhea.  Strict ED return precautions. Patient verbalized understanding and agreeable to discharge plan.   Final Clinical Impressions(s) / ED Diagnoses   Final diagnoses:  Diarrhea of presumed infectious origin  Right flank pain    ED Discharge Orders         Ordered    lidocaine (LIDODERM) 5 %  Every 24 hours     05/06/18 2145           Kinnie Feil, PA-C 05/06/18 2216    Sherwood Gambler, MD 05/06/18 2233

## 2018-05-06 NOTE — ED Notes (Signed)
Patient given graham crackers and PO fluids . Tolerating well.

## 2018-05-06 NOTE — ED Notes (Signed)
Patient verbalizes understanding of medications and discharge instructions. No further questions at this time. VSS and patient ambulatory at discharge.   

## 2018-05-06 NOTE — Discharge Instructions (Addendum)
You were seen in the emergency department for diarrhea.  We were unable to collect a sample to test it.  Most likely, this is from a virus.  Stay well-hydrated.  Avoid foods or dairy products as this can worsen diarrhea.  Return to the ER for fevers, chills, vomiting, heavy diarrhea, blood in your stools, burning with urination.    Follow-up with your primary care doctor in the next 48 to 72 hours if diarrhea does not start to slow down.  Apply lidocaine patches to right flank area to help with pain.

## 2018-05-06 NOTE — ED Provider Notes (Signed)
Patient placed in Quick Look pathway, seen and evaluated   Chief Complaint: diarrhea, abd pain  HPI: Patient with history of ESRD on dialysis Tuesday Thursday Saturday notes right-sided abdominal pain began this morning and worsened after dialysis.  States she could only tolerate half of the treatment.  Notes 2 episodes of watery nonbloody diarrhea.  Notes nausea but no vomiting.  Denies fevers or chills.  Also notes very mild shortness of breath and cough productive of thick white sputum.  ROS: Positive for abdominal pain, cough, nausea Negative for chest pain, fevers  Physical Exam:   Gen: Appears uncomfortable  Neuro: Awake and Alert  Skin: Warm    Focused Exam: Large well-healed surgical scar from prior cholecystectomy.  Bowel sounds active.  Right upper quadrant and right lower quadrant tender to palpation.  No peritoneal signs.  Some guarding noted.  Globally diminished breath sounds, mild bibasilar crackles.  Heart regular rate and rhythm.   Initiation of care has begun. The patient has been counseled on the process, plan, and necessity for staying for the completion/evaluation, and the remainder of the medical screening examination    Debroah Baller 05/06/18 1530    Drenda Freeze, MD 05/06/18 1535

## 2018-05-06 NOTE — ED Triage Notes (Signed)
Pt coming from dialysis in the middle of her treatment. Pt unable to finish d/t diarrhea, only received half of treatment. Dialysis TThSa, rt port access and left fistula. Pt reports 9/10 RUQ & RLQ abd pain for the last week with diarrhea 2x today. Denies cp or sob. Pt A&Ox4 and ambulatory.

## 2018-05-06 NOTE — ED Notes (Signed)
States "I'm ready to go home, I can be miserable at home and not here in the hospital".   Patient given warm blanket and ice chips. Patient comfortable.

## 2018-05-06 NOTE — ED Notes (Signed)
Pt urine looked dark and cloudy so I sent a urine culture down with specimen

## 2018-05-08 LAB — URINE CULTURE: CULTURE: NO GROWTH

## 2018-05-09 ENCOUNTER — Ambulatory Visit (HOSPITAL_COMMUNITY): Admission: RE | Admit: 2018-05-09 | Payer: Medicaid Other | Source: Ambulatory Visit | Admitting: Vascular Surgery

## 2018-05-09 ENCOUNTER — Encounter (HOSPITAL_COMMUNITY): Admission: RE | Payer: Self-pay | Source: Ambulatory Visit

## 2018-05-09 SURGERY — A/V FISTULAGRAM
Anesthesia: LOCAL

## 2018-05-13 ENCOUNTER — Emergency Department (HOSPITAL_COMMUNITY): Payer: Medicaid Other

## 2018-05-13 ENCOUNTER — Other Ambulatory Visit: Payer: Self-pay

## 2018-05-13 ENCOUNTER — Telehealth: Payer: Self-pay | Admitting: *Deleted

## 2018-05-13 ENCOUNTER — Encounter (HOSPITAL_COMMUNITY): Payer: Self-pay

## 2018-05-13 ENCOUNTER — Emergency Department (HOSPITAL_COMMUNITY)
Admission: EM | Admit: 2018-05-13 | Discharge: 2018-05-15 | Disposition: A | Discharge status: Home / Self Care | Payer: Medicaid Other | Attending: Emergency Medicine | Admitting: Emergency Medicine

## 2018-05-13 ENCOUNTER — Inpatient Hospital Stay (INDEPENDENT_AMBULATORY_CARE_PROVIDER_SITE_OTHER): Payer: Self-pay | Admitting: Physician Assistant

## 2018-05-13 DIAGNOSIS — F322 Major depressive disorder, single episode, severe without psychotic features: Secondary | ICD-10-CM | POA: Insufficient documentation

## 2018-05-13 DIAGNOSIS — Z79899 Other long term (current) drug therapy: Secondary | ICD-10-CM | POA: Insufficient documentation

## 2018-05-13 DIAGNOSIS — Z7901 Long term (current) use of anticoagulants: Secondary | ICD-10-CM | POA: Diagnosis not present

## 2018-05-13 DIAGNOSIS — E1122 Type 2 diabetes mellitus with diabetic chronic kidney disease: Secondary | ICD-10-CM | POA: Insufficient documentation

## 2018-05-13 DIAGNOSIS — R451 Restlessness and agitation: Secondary | ICD-10-CM

## 2018-05-13 DIAGNOSIS — N186 End stage renal disease: Secondary | ICD-10-CM | POA: Insufficient documentation

## 2018-05-13 DIAGNOSIS — Z794 Long term (current) use of insulin: Secondary | ICD-10-CM | POA: Insufficient documentation

## 2018-05-13 DIAGNOSIS — F329 Major depressive disorder, single episode, unspecified: Secondary | ICD-10-CM | POA: Diagnosis present

## 2018-05-13 DIAGNOSIS — Z87891 Personal history of nicotine dependence: Secondary | ICD-10-CM | POA: Insufficient documentation

## 2018-05-13 DIAGNOSIS — F1994 Other psychoactive substance use, unspecified with psychoactive substance-induced mood disorder: Secondary | ICD-10-CM

## 2018-05-13 DIAGNOSIS — I120 Hypertensive chronic kidney disease with stage 5 chronic kidney disease or end stage renal disease: Secondary | ICD-10-CM | POA: Insufficient documentation

## 2018-05-13 LAB — CBC
HEMATOCRIT: 34.4 % — AB (ref 36.0–46.0)
HEMOGLOBIN: 10.2 g/dL — AB (ref 12.0–15.0)
MCH: 23.9 pg — ABNORMAL LOW (ref 26.0–34.0)
MCHC: 29.7 g/dL — ABNORMAL LOW (ref 30.0–36.0)
MCV: 80.8 fL (ref 78.0–100.0)
Platelets: 365 10*3/uL (ref 150–400)
RBC: 4.26 MIL/uL (ref 3.87–5.11)
RDW: 20.3 % — ABNORMAL HIGH (ref 11.5–15.5)
WBC: 7.7 10*3/uL (ref 4.0–10.5)

## 2018-05-13 LAB — CBG MONITORING, ED: Glucose-Capillary: 137 mg/dL — ABNORMAL HIGH (ref 70–99)

## 2018-05-13 LAB — ETHANOL: Alcohol, Ethyl (B): <10 mg/dL (ref <10)

## 2018-05-13 LAB — RAPID URINE DRUG SCREEN, HOSP PERFORMED
AMPHETAMINES: NOT DETECTED
BARBITURATES: NOT DETECTED
Benzodiazepines: NOT DETECTED
Cocaine: POSITIVE — AB
OPIATES: NOT DETECTED
TETRAHYDROCANNABINOL: NOT DETECTED

## 2018-05-13 LAB — PROTIME-INR
INR: 2.63
PROTHROMBIN TIME: 27.9 s — AB (ref 11.4–15.2)

## 2018-05-13 LAB — SALICYLATE LEVEL

## 2018-05-13 LAB — COMPREHENSIVE METABOLIC PANEL
ALK PHOS: 120 U/L (ref 38–126)
ALT: 22 U/L (ref 0–44)
ANION GAP: 14 (ref 5–15)
AST: 36 U/L (ref 15–41)
Albumin: 2.7 g/dL — ABNORMAL LOW (ref 3.5–5.0)
BILIRUBIN TOTAL: 0.6 mg/dL (ref 0.3–1.2)
BUN: 40 mg/dL — AB (ref 8–23)
CO2: 23 mmol/L (ref 22–32)
CREATININE: 4.69 mg/dL — AB (ref 0.44–1.00)
Calcium: 8.4 mg/dL — ABNORMAL LOW (ref 8.9–10.3)
Chloride: 108 mmol/L (ref 98–111)
GFR calc Af Amer: 10 mL/min — ABNORMAL LOW (ref >60)
GFR, EST NON AFRICAN AMERICAN: 9 mL/min — AB (ref >60)
GLUCOSE: 130 mg/dL — AB (ref 70–99)
Potassium: 4.5 mmol/L (ref 3.5–5.1)
Sodium: 145 mmol/L (ref 135–145)
Total Protein: 7.1 g/dL (ref 6.5–8.1)

## 2018-05-13 LAB — ACETAMINOPHEN LEVEL: Acetaminophen (Tylenol), Serum: <10 ug/mL — ABNORMAL LOW (ref 10–30)

## 2018-05-13 MED ORDER — ONDANSETRON HCL 4 MG PO TABS
4.0000 mg | ORAL_TABLET | Freq: Four times a day (QID) | ORAL | Status: DC | PRN
  Administered 2018-05-14: 4 mg via ORAL
  Filled 2018-05-13: qty 1

## 2018-05-13 MED ORDER — LORAZEPAM 1 MG PO TABS
1.0000 mg | ORAL_TABLET | ORAL | Status: AC | PRN
  Administered 2018-05-14: 1 mg via ORAL
  Filled 2018-05-13: qty 1

## 2018-05-13 MED ORDER — WARFARIN SODIUM 2.5 MG PO TABS
2.5000 mg | ORAL_TABLET | Freq: Every day | ORAL | Status: DC
  Administered 2018-05-14: 2.5 mg via ORAL
  Filled 2018-05-13 (×3): qty 1

## 2018-05-13 MED ORDER — MORPHINE SULFATE (PF) 4 MG/ML IV SOLN
4.0000 mg | Freq: Once | INTRAVENOUS | Status: AC
  Administered 2018-05-13: 4 mg via INTRAMUSCULAR
  Filled 2018-05-13: qty 1

## 2018-05-13 MED ORDER — ACETAMINOPHEN 325 MG PO TABS: 650.0000 mg | ORAL_TABLET | Freq: Four times a day (QID) | ORAL | Status: DC | PRN

## 2018-05-13 MED ORDER — INSULIN GLARGINE 100 UNIT/ML ~~LOC~~ SOLN
5.0000 [IU] | Freq: Every day | SUBCUTANEOUS | Status: DC
  Administered 2018-05-13 – 2018-05-15 (×2): 5 [IU] via SUBCUTANEOUS
  Filled 2018-05-13 (×3): qty 0.05

## 2018-05-13 MED ORDER — METOPROLOL TARTRATE 25 MG PO TABS
25.0000 mg | ORAL_TABLET | Freq: Two times a day (BID) | ORAL | Status: DC
  Administered 2018-05-13 – 2018-05-15 (×4): 25 mg via ORAL
  Filled 2018-05-13 (×4): qty 1

## 2018-05-13 MED ORDER — INSULIN ASPART 100 UNIT/ML ~~LOC~~ SOLN
5.0000 [IU] | Freq: Three times a day (TID) | SUBCUTANEOUS | Status: DC
  Administered 2018-05-14 – 2018-05-15 (×3): 5 [IU] via SUBCUTANEOUS
  Filled 2018-05-13 (×3): qty 1

## 2018-05-13 MED ORDER — ZOLPIDEM TARTRATE 5 MG PO TABS
5.0000 mg | ORAL_TABLET | Freq: Every evening | ORAL | Status: DC | PRN
  Administered 2018-05-13: 5 mg via ORAL
  Filled 2018-05-13: qty 1

## 2018-05-13 MED ORDER — CALCITRIOL 0.25 MCG PO CAPS
0.2500 ug | ORAL_CAPSULE | ORAL | Status: DC
  Administered 2018-05-13 – 2018-05-15 (×2): 0.25 ug via ORAL
  Filled 2018-05-13 (×2): qty 1

## 2018-05-13 MED ORDER — ZIPRASIDONE MESYLATE 20 MG IM SOLR: 20.0000 mg | Freq: Two times a day (BID) | INTRAMUSCULAR | Status: DC | PRN

## 2018-05-13 NOTE — Telephone Encounter (Addendum)
Patient was scheduled to have a left upper arm fistulogram with Dr. Scot Dock on 05-09-18 but no showed. I called [place] at [facility] [phone]) and told her; she stated that the patient no showed for her dialysis session today also. She will instruct patient to call us back to reschedule this fistulogram if she comes back to HD on Thursday.   Patient will need to stop her COUMADIN x 48 hours prior to fistulogram.

## 2018-05-13 NOTE — BH Assessment (Signed)
TTS attempted to complete assessment.  When asked the pt's name and birthday the pt initially did not respond and then would only give the month date and not the year.  When asked about SI, the pt would not give a clear answer.  She stated, "Go ask the person who told you"  "Why would I kill myself when I'm about to die".  The pt was encouraged to participate in the assessment and she stated approximately 3 times that she did not want to talk.  The pt was reminded that she could not leave the hospital until the assessment was completed.  The pt stated again that she did not want to talk.  The TTS assessment will be completed at another time.

## 2018-05-13 NOTE — ED Provider Notes (Signed)
[facility] EMERGENCY DEPARTMENT Provider Note   CSN: [id number] Arrival date & time: 05/13/18  1755     History   Chief Complaint Chief Complaint  Patient presents with  . Suicidal    HPI Rhonda Lynch is a 62 y.o. female.  HPI  Vision presents with community psychiatric support member, as well as with her son, though she is not willing to interact with her son. Patient is under involuntary commitment, which was executed by her son and his fiance. The patient herself is combative, aggressive, states that she does not want to be here, is tired of being sick. She notes that she feels generally unwell without specifying focal pain. She acknowledges a history of end-stage renal disease, as well as multiple other medical problems. Reportedly the patient was last seen at dialysis 3 days ago, did not complete her session due to feeling generally unwell. Patient cannot specify where she was afterwards, but the patient's son notes concern that she was at a "crack house," possibly for the past 3 days. He does not know of formal psychiatric diagnoses, but has concern for these. He notes that he is not his mother's primary medical contact.    Past Medical History:  Diagnosis Date  . Chronic kidney disease   . ESRD (end stage renal disease) ([place])   . Hypertension   . Oxygen deficiency    2 L at night  . Substance abuse ([place])    has been clean for 4 months     Patient Active Problem List   Diagnosis Date Noted  . Hemodialysis catheter infection, initial encounter ([employer])   . 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 Pulmonary embolism ([place]) 04/27/2018  . Sepsis ([place]) 04/26/2018  . Dyspnea 04/26/2018  . Chest pain 04/26/2018  . Tachycardia 04/26/2018  . Abdominal pain 04/18/2018  . DM (diabetes mellitus), type 2 with renal complications ([place]) 03/05/9484  . Right flank pain 04/18/2018  . Acute respiratory failure with hypoxia ([place]) 04/12/2018  . SVT (supraventricular  tachycardia) ([place])   . Chronic renal failure   . Diabetes ([place]) 04/02/2018  . Hypertension   . Noncompliance of patient with renal dialysis ([place])   . Acute pulmonary edema ([facility])   . Elevated troponin 03/19/2018  . AKI (acute kidney injury) ([place])   . Fever   . 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 Diffuse pulmonary alveolar hemorrhage   . Hypoxemia   . Pulmonary infiltrate   . 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[place])   . ESRD (end stage renal disease) ([place]) 02/12/2018  . Acute respiratory failure ([place]) 02/12/2018  . Alcohol abuse 02/12/2018  . Anemia 02/12/2018  . Cocaine abuse ([place]) 02/12/2018  . Epigastric pain 02/12/2018  . Pneumonia 02/12/2018  . TOBACCO ABUSE 03/08/2010  . PNEUMONIA 03/08/2010  . UTI 03/08/2010  . ELEVATED BLOOD PRESSURE WITHOUT DIAGNOSIS OF HYPERTENSION 03/08/2010    Past Surgical History:  Procedure Laterality Date  . AV FISTULA PLACEMENT Left 03/05/2018   Procedure: ARTERIOVENOUS (AV) FISTULA CREATION  LEFT UPPER EXTREMITY;  Surgeon: [name], MD;  Location: [facility];  Service: Vascular;  Laterality: Left;  . HEMATOMA EVACUATION Left 03/06/2018   Procedure: EVACUATION HEMATOMA LEFT ARM;  Surgeon: Angelia Mould, MD;  Location: [facility];  Service: Vascular;  Laterality: Left;  . INSERTION OF DIALYSIS CATHETER Left 04/29/2018   Procedure: INSERTION OF DIALYSIS CATHETER LEFT INTERNAL JUGULAR;  Surgeon: Angelia Mould, MD;  Location: [facility];  Service: Vascular;  Laterality: Left;  .  IR FLUORO GUIDE CV LINE RIGHT  02/18/2018  . IR FLUORO GUIDE CV LINE RIGHT  02/26/2018  . IR REMOVAL TUN CV CATH W/O FL  04/27/2018  . IR US GUIDE VASC ACCESS RIGHT  02/18/2018  . TEE WITHOUT CARDIOVERSION N/A 04/29/2018   Procedure: TRANSESOPHAGEAL ECHOCARDIOGRAM (TEE);  Surgeon: Sueanne Margarita, MD;  Location: [facility] ENDOSCOPY;  Service: Cardiovascular;  Laterality: N/A;     OB History   None      Home Medications    Prior to Admission medications    Medication Sig Start Date End Date Taking? Authorizing Provider  acetaminophen (TYLENOL) 325 MG tablet Take 2 tablets (650 mg total) by mouth every 6 (six) hours as needed for mild pain (or Fever >/= 101). 04/24/18   Ghimire, Henreitta Leber, MD  calcitRIOL (ROCALTROL) 0.25 MCG capsule Take 1 capsule (0.25 mcg total) by mouth every other day. 04/14/18   Nita Sells, MD  feeding supplement, GLUCERNA SHAKE, (GLUCERNA SHAKE) LIQD Take 237 mLs by mouth 2 (two) times daily between meals. Patient not taking: Reported on 04/26/2018 04/24/18   Jonetta Osgood, MD  fluconazole (DIFLUCAN) 200 MG tablet Take 1 tablet (200 mg total) by mouth daily. 05/02/18   Shelly Coss, MD  insulin aspart (NOVOLOG) 100 UNIT/ML injection Inject 0-9 Units into the skin 3 (three) times daily with meals. Patient taking differently: Inject 5 Units into the skin 3 (three) times daily with meals.  04/13/18   Nita Sells, MD  insulin glargine (LANTUS) 100 UNIT/ML injection Inject 0.05 mLs (5 Units total) into the skin at bedtime. 04/13/18   Nita Sells, MD  metoprolol tartrate (LOPRESSOR) 25 MG tablet Take 1 tablet (25 mg total) by mouth 2 (two) times daily. 05/02/18   Shelly Coss, MD  multivitamin (RENA-VIT) TABS tablet Take 1 tablet by mouth at bedtime. 04/13/18   Nita Sells, MD  ondansetron (ZOFRAN) 4 MG tablet Take 1 tablet (4 mg total) by mouth every 6 (six) hours as needed for nausea. 04/13/18   Nita Sells, MD  warfarin (COUMADIN) 2.5 MG tablet Take 1 tablet (2.5 mg total) by mouth daily at 6 PM. 05/03/18   Shelly Coss, MD  zolpidem (AMBIEN) 5 MG tablet Take 1 tablet (5 mg total) by mouth at bedtime as needed for sleep. 04/13/18   Nita Sells, MD    Family History Family History  Problem Relation Age of Onset  . Autoimmune disease Neg Hx     Social History Social History   Tobacco Use  . Smoking status: Former Smoker    Packs/day: 0.50    Last attempt to quit:  04/08/2018    Years since quitting: 0.0  . Smokeless tobacco: Never Used  . Tobacco comment: trying to quit 04/02/2018  Substance Use Topics  . Alcohol use: Yes  . Drug use: Yes    Types: Cocaine     Allergies   Patient has no known allergies.   Review of Systems Review of Systems  Unable to perform ROS: Psychiatric disorder     Physical Exam Updated Vital Signs BP (!) 146/100   Pulse (!) 105   Temp 98.6 F (37 C) (Oral)   Resp (!) 22   Physical Exam  Constitutional: She appears well-developed and well-nourished. She appears distressed.  Agitated elderly appearing F, minimally cooperative  HENT:  Head: Normocephalic and atraumatic.  Eyes: Conjunctivae and EOM are normal.  Cardiovascular: Regular rhythm. Tachycardia present.  Pulmonary/Chest: Effort normal and breath sounds normal. No stridor. No respiratory  distress.  Abdominal: She exhibits no distension.  Musculoskeletal: She exhibits no edema.  Neurological: She is alert. She displays atrophy. No cranial nerve deficit. She exhibits normal muscle tone.  Skin: Skin is warm and dry.  Psychiatric: Her mood appears anxious. Her affect is angry. Her speech is rapid and/or pressured. She is agitated. Cognition and memory are impaired.  Nursing note and vitals reviewed.    ED Treatments / Results  Labs (all labs ordered are listed, but only abnormal results are displayed) Labs Reviewed  COMPREHENSIVE METABOLIC PANEL - Abnormal; Notable for the following components:      Result Value   Glucose, Bld 130 (*)    BUN 40 (*)    Creatinine, Ser 4.69 (*)    Calcium 8.4 (*)    Albumin 2.7 (*)    GFR calc non Af Amer 9 (*)    GFR calc Af Amer 10 (*)    All other components within normal limits  ACETAMINOPHEN LEVEL - Abnormal; Notable for the following components:   Acetaminophen (Tylenol), Serum <10 (*)    All other components within normal limits  CBC - Abnormal; Notable for the following components:   Hemoglobin 10.2  (*)    HCT 34.4 (*)    MCH 23.9 (*)    MCHC 29.7 (*)    RDW 20.3 (*)    All other components within normal limits  ETHANOL  SALICYLATE LEVEL  RAPID URINE DRUG SCREEN, [place]     Radiology Dg Chest 2 View  Result Date: 05/13/2018 CLINICAL DATA:  Pain near the dialysis catheter. EXAM: CHEST - 2 VIEW COMPARISON:  Chest x-ray dated May 06, 2018. FINDINGS: Unchanged tunneled right internal jugular dialysis catheter with the tip in the proximal right atrium. Stable mild cardiomegaly. Normal pulmonary vascularity. Unchanged elevation of the right hemidiaphragm. Previously seen right lower lobe airspace disease has essentially resolved. Mild residual right basilar atelectasis. The left lung is clear. No pleural effusion or pneumothorax. No acute osseous abnormality. IMPRESSION: 1. Unremarkable right internal jugular dialysis catheter. 2. Resolved right lower lobe airspace disease. Electronically Signed   By: Titus Dubin M.D.   On: 05/13/2018 21:10    Procedures Procedures (including critical care time)  Medications Ordered in ED Medications - No data to display   Initial Impression / Assessment and Plan / ED Course  I have reviewed the triage vital signs and the nursing notes.  Pertinent labs & imaging results that were available during my care of the patient were reviewed by me and considered in my medical decision making (see chart for details).  This patient presents with the need for medical evaluation due to ongoing psychiatric condition.  [name] medical portion of the evaluation is generally reassuring, with no evidence of acute new pathology.  The patient has been medically cleared for further psychiatric evaluation.  Final Clinical Impressions(s) / ED Diagnoses  Agitation   Carmin Muskrat, MD 05/13/18 2157

## 2018-05-13 NOTE — ED Notes (Signed)
Per son- patient not taking care of herself at home, taking illicit drugs and not going to dialysis, he attempted to make himself temporary legal guardian and she got mad at him; per son, she has been injecting heroin in her dialysis cath and she is withdrawing ACT team brought her to the ER, he reports she was tugging at her dialysis cath earlier; pt now complaining of pain at cath site; pt writhing in bed one min, and the next she is snoring

## 2018-05-13 NOTE — ED Notes (Signed)
Pt returned from XR, will cont to monitor

## 2018-05-13 NOTE — ED Notes (Signed)
Pt states she makes urine only occassionally

## 2018-05-13 NOTE — ED Notes (Signed)
TTS performed. Pt non compliant

## 2018-05-13 NOTE — ED Notes (Signed)
Belongings placed in locker 2 in POD F.  Awaiting to be wanded by security.

## 2018-05-13 NOTE — ED Triage Notes (Signed)
Pt here from home with complaints of pain everywhere and feels like she may hurt her self.  On dialysis T, TH, Sa.  Last treatment was Sa and got 2hrs of the treatment.  No plan to hurt herself yet just feels like she might because of the pain and feels like no one cares.

## 2018-05-14 ENCOUNTER — Encounter (HOSPITAL_COMMUNITY): Payer: Self-pay | Admitting: Behavioral Health

## 2018-05-14 LAB — BASIC METABOLIC PANEL
Anion gap: 9 (ref 5–15)
BUN: 36 mg/dL — ABNORMAL HIGH (ref 8–23)
CALCIUM: 8.4 mg/dL — AB (ref 8.9–10.3)
CHLORIDE: 109 mmol/L (ref 98–111)
CO2: 27 mmol/L (ref 22–32)
Creatinine, Ser: 4.76 mg/dL — ABNORMAL HIGH (ref 0.44–1.00)
GFR calc non Af Amer: 9 mL/min — ABNORMAL LOW (ref >60)
GFR, EST AFRICAN AMERICAN: 10 mL/min — AB (ref >60)
GLUCOSE: 77 mg/dL (ref 70–99)
POTASSIUM: 4.4 mmol/L (ref 3.5–5.1)
Sodium: 145 mmol/L (ref 135–145)

## 2018-05-14 LAB — PROTIME-INR
INR: 2.53
Prothrombin Time: 27 seconds — ABNORMAL HIGH (ref 11.4–15.2)

## 2018-05-14 LAB — CBG MONITORING, ED
GLUCOSE-CAPILLARY: 82 mg/dL (ref 70–99)
GLUCOSE-CAPILLARY: 120 mg/dL — AB (ref 70–99)
Glucose-Capillary: 144 mg/dL — ABNORMAL HIGH (ref 70–99)
Glucose-Capillary: 116 mg/dL — ABNORMAL HIGH (ref 70–99)

## 2018-05-14 MED ORDER — WARFARIN - PHYSICIAN DOSING INPATIENT: Freq: Every day | Status: DC

## 2018-05-14 NOTE — BH Assessment (Signed)
Tele Assessment Note   Patient Name: Rhonda Lynch MRN: [id number] Referring Physician: EDP Location of Patient: [facility] Location of Provider: [facility]  JAIA ALONGE is a 62 y.o. female who presented to [facility] with complaint of suicidal ideation, despondency, substance use, and other symptoms.  Pt is currently under IVC (petitioner is son).  Per report, Pt lives with her son and his family.  Pt refused assessment twice before consenting to speak with TTS.  Pt reported as follows:  Pt stated that she came to the ED because she is ''fed up'' with living.  ''I just want to die.'' Pt has renal failure and is on dialysis, and she reported that recently she decided to stop taking all of her medication because it was not helping.  Pt also reported endorsed persistent and unremitting despondency, feelings of hopelessness and worthlessness, irritability, power concentration.  Pt's UDS was positive for cocaine, and per report, Pt has been using both cocaine and heroin (shooting directly into her dialysis IV/port).  Pt denied that these were suicide attempts, and she denied past suicide attempts.  Pt also denied hallucination, homicidal ideation, and self-injurious behavior.  When asked what she wanted to do today, Pt started crying, stated that she did not want to go home because she would be ''all alone.''    During assessment, Pt presented as alert and oriented.  She had fair eye contact and was cooperative.  Mood was depressed.  Affect was blunted and irritable.  Pt's demeanor was calm.  Pt was eating breakfast during assessment.  Pt endorsed suicidal ideation (she denied current plan or intent); persistent and unremitting despondency; irritability; guilt; poor concentration.  In addition, Pt stated that she has decided to stop taking her medication in spite of the risk to health, and that she uses substances (see above).  Pt's speech was normal in rate, rhythm, and volume. Thought  processes were within normal range, and thought content was logical and goal-oriented. There was no evidence of delusion.  Pt's memory and concentration were fair.  Insight, judgment, and impulse control were deemed poor.  Consulted with S. Rankin, NP, who determined that Pt meets inpatient criteria.  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 Chronic kidney disease   . ESRD (end stage renal disease) ([place])   . Hypertension   . Oxygen deficiency    2 L at night  . Substance abuse ([place])    has been clean for 4 months     Past Surgical History:  Procedure Laterality Date  . AV FISTULA PLACEMENT Left 03/05/2018   Procedure: ARTERIOVENOUS (AV) FISTULA CREATION  LEFT UPPER EXTREMITY;  Surgeon: [name], MD;  Location: [facility];  Service: Vascular;  Laterality: Left;  . HEMATOMA EVACUATION Left 03/06/2018   Procedure: EVACUATION HEMATOMA LEFT ARM;  Surgeon: Angelia Mould, MD;  Location: [facility];  Service: Vascular;  Laterality: Left;  . INSERTION OF DIALYSIS CATHETER Left 04/29/2018   Procedure: INSERTION OF DIALYSIS CATHETER LEFT INTERNAL JUGULAR;  Surgeon: Angelia Mould, MD;  Location: [facility];  Service: Vascular;  Laterality: Left;  . IR FLUORO GUIDE CV LINE RIGHT  02/18/2018  . IR FLUORO GUIDE CV LINE RIGHT  02/26/2018  . IR REMOVAL TUN CV CATH W/O FL  04/27/2018  . IR US GUIDE VASC ACCESS RIGHT  02/18/2018  . TEE WITHOUT CARDIOVERSION N/A 04/29/2018  Procedure: TRANSESOPHAGEAL ECHOCARDIOGRAM (TEE);  Surgeon: Sueanne Margarita, MD;  Location: [facility] ENDOSCOPY;  Service: Cardiovascular;  Laterality: N/A;    Family History:  Family History  Problem Relation Age of Onset  . Autoimmune disease Neg Hx     Social History:  reports that she quit smoking about 5 weeks ago. She smoked 0.50 packs per day. She has never used smokeless tobacco. She reports that she drinks alcohol.  She reports that she has current or past drug history. Drugs: Cocaine and Heroin.  Additional Social History:  Alcohol / Drug Use Pain Medications: See MAR Prescriptions: See MAR Over the Counter: See MAR History of alcohol / drug use?: Yes(Pt's UDS + Cocaine; per report, Pt used heroin)  CIWA: CIWA-Ar BP: (!) 148/95 Pulse Rate: 90 COWS:    Allergies: No Known Allergies  Home Medications:  (Not in a hospital admission)  OB/GYN Status:  No LMP recorded. Patient is postmenopausal.  General Assessment Data Assessment unable to be completed: Yes Reason for not completing assessment: Pt refused to cooperate with assessment Location of Assessment: [facility] ED TTS Assessment: In system Is this a Tele or Face-to-Face Assessment?: Tele Assessment Is this an Initial Assessment or a Re-assessment for this encounter?: Initial Assessment Patient Accompanied by:: N/A Language Other than English: No Living Arrangements: Other (Comment)(Lives with son) What gender do you identify as?: Female Marital status: Divorced Pregnancy Status: No Living Arrangements: Children Can pt return to current living arrangement?: Yes Admission Status: Involuntary Petitioner: Family member Is patient capable of signing voluntary admission?: Yes Referral Source: Self/Family/Friend Insurance type: [place] MCD     Crisis Care Plan Living Arrangements: Children Name of Psychiatrist: None reported Name of Therapist: None reported  Education Status Is patient currently in school?: No Is the patient employed, unemployed or receiving disability?: Unemployed  Risk to self with the past 6 months Suicidal Ideation: Yes-Currently Present Has patient been a risk to self within the past 6 months prior to admission? : No Suicidal Intent: No Has patient had any suicidal intent within the past 6 months prior to admission? : Other (comment)(Pt said not sure) Is patient at risk for suicide?: Yes Suicidal Plan?: No(Pt  denied, but stopped taking medication) Has patient had any suicidal plan within the past 6 months prior to admission? : No Access to Means: No What has been your use of drugs/alcohol within the last 12 months?: Cocaine, heroin (per report)(UDS + Cocaine) Previous Attempts/Gestures: No Other Self Harm Risks: Stopped taking medication Intentional Self Injurious Behavior: None Family Suicide History: Unknown Recent stressful life event(s): Recent negative physical changes, Conflict (Comment)(Renal failure; conflict with son) Persecutory voices/beliefs?: No Depression: Yes Depression Symptoms: Despondent, Tearfulness, Isolating, Fatigue, Guilt, Loss of interest in usual pleasures, Feeling worthless/self pity, Feeling angry/irritable Substance abuse history and/or treatment for substance abuse?: Yes Suicide prevention information given to non-admitted patients: Not applicable  Risk to Others within the past 6 months Homicidal Ideation: No Does patient have any lifetime risk of violence toward others beyond the six months prior to admission? : No Thoughts of Harm to Others: No Current Homicidal Intent: No Current Homicidal Plan: No Access to Homicidal Means: No History of harm to others?: No Assessment of Violence: None Noted Does patient have access to weapons?: No Criminal Charges Pending?: No Does patient have a court date: No Is patient on probation?: No  Psychosis Hallucinations: None noted Delusions: None noted  Mental Status Report Appearance/Hygiene: Unremarkable, In scrubs Eye Contact: Fair Motor Activity: Freedom of movement,  Unremarkable Speech: Logical/coherent Level of Consciousness: Alert Mood: Depressed Affect: Blunted, Irritable, Appropriate to circumstance Anxiety Level: None Thought Processes: Coherent, Relevant Judgement: Impaired Orientation: Person, Place, Situation, Time Obsessive Compulsive Thoughts/Behaviors: None  Cognitive Functioning Concentration:  Normal Memory: Recent Intact, Remote Intact Is patient IDD: No Insight: Poor Impulse Control: Poor Appetite: Fair Have you had any weight changes? : No Change Sleep: No Change Vegetative Symptoms: None  ADLScreening [facility] Assessment Services) Patient's cognitive ability adequate to safely complete daily activities?: Yes Patient able to express need for assistance with ADLs?: Yes Independently performs ADLs?: Yes (appropriate for developmental age)  Prior Inpatient Therapy Prior Inpatient Therapy: No  Prior Outpatient Therapy Prior Outpatient Therapy: No Does patient have an ACCT team?: No Does patient have Intensive In-House Services?  : No Does patient have [facility] services? : No Does patient have P4CC services?: No  ADL Screening (condition at time of admission) Patient's cognitive ability adequate to safely complete daily activities?: Yes Is the patient deaf or have difficulty hearing?: No Does the patient have difficulty seeing, even when wearing glasses/contacts?: No Does the patient have difficulty concentrating, remembering, or making decisions?: No Patient able to express need for assistance with ADLs?: Yes Does the patient have difficulty dressing or bathing?: No Independently performs ADLs?: Yes (appropriate for developmental age) Does the patient have difficulty walking or climbing stairs?: No Weakness of Legs: None Weakness of Arms/Hands: None  Home Assistive Devices/Equipment Home Assistive Devices/Equipment: None  Therapy Consults (therapy consults require a physician order) PT Evaluation Needed: No OT Evalulation Needed: No SLP Evaluation Needed: No Abuse/Neglect Assessment (Assessment to be complete while patient is alone) Abuse/Neglect Assessment Can Be Completed: Yes Physical Abuse: Denies Verbal Abuse: Denies Sexual Abuse: Denies Exploitation of patient/patient's resources: Denies Self-Neglect: Denies Values / Beliefs Cultural Requests During  Hospitalization: None Spiritual Requests During Hospitalization: None Consults Spiritual Care Consult Needed: No Social Work Consult Needed: No Regulatory affairs officer (For Healthcare) Does Patient Have a Medical Advance Directive?: No          Disposition:  Disposition Initial Assessment Completed for this Encounter: Yes Disposition of Patient: Admit Type of inpatient treatment program: Adult(Per S. Rankin, NP, Pt meets inpt criteria)  This service was provided via telemedicine using a 2-way, interactive audio and video technology.  Names of all persons participating in this telemedicine service and their role in this encounter. Name: Rhonda Lynch, Rhonda Lynch Role: Patient             Marlowe Aschoff 05/14/2018 10:27 AM

## 2018-05-14 NOTE — ED Notes (Signed)
pts son called and asked why his mom was still in the er, I explained that I could not give any information over the phone to people, said he could come in person, explained aBOUT TIMES TO VISIT

## 2018-05-14 NOTE — Progress Notes (Signed)
Pt. meets criteria for inpatient treatment per Earleen [place], NP.  Referred out to the following hospitals: [facility]  [facility]  [facility]  [facility]     Disposition CSW will continue to follow for placement.  Areatha Keas. Judi Cong, MSW, [place] Disposition Clinical Social Work [phone] (cell) [phone] (office)

## 2018-05-14 NOTE — ED Notes (Signed)
TTS at bedside. 

## 2018-05-14 NOTE — ED Notes (Signed)
Dinner tray ordered.

## 2018-05-14 NOTE — ED Notes (Addendum)
Pt in bed shaking stating she is hurting near port site and all over, requesting pain medication. Pt reports she doesn't take Tylenol which was offered to her by this RN. Pt reported she wanted to try the prn zofran and ativan ordered to see if that will help.

## 2018-05-14 NOTE — BH Assessment (Signed)
Contacted pt in an attempt to complete TTS Assessment. Pt refused to sit up or answer questions. She stated clinician should just "put whatever [ I ] want on the paper" and that she wants to get away from the hospital. When clinician stated pt's information would assist in pt getting assistance with anything she might need, pt stated she would no longer answer any questions and that the hospital was not helping her anyway.   TTS will attempt assessment again at a later time.

## 2018-05-14 NOTE — ED Notes (Signed)
Pt agreeing to TTS at this time. Attempted to contact counselor however no one answered. Will re-attempt in a moment. Pt is very tearful and depressed at this time states she does not want to hurt herself "because I already hurt" I just don't have a purpose to live anymore. She is calm and cooperative at this time.

## 2018-05-15 ENCOUNTER — Encounter (HOSPITAL_COMMUNITY): Payer: Self-pay | Admitting: Registered Nurse

## 2018-05-15 DIAGNOSIS — F1994 Other psychoactive substance use, unspecified with psychoactive substance-induced mood disorder: Secondary | ICD-10-CM

## 2018-05-15 DIAGNOSIS — R451 Restlessness and agitation: Secondary | ICD-10-CM | POA: Insufficient documentation

## 2018-05-15 LAB — RENAL FUNCTION PANEL
ALBUMIN: 2.2 g/dL — AB (ref 3.5–5.0)
Anion gap: 12 (ref 5–15)
BUN: 35 mg/dL — ABNORMAL HIGH (ref 8–23)
CALCIUM: 7.9 mg/dL — AB (ref 8.9–10.3)
CO2: 21 mmol/L — ABNORMAL LOW (ref 22–32)
CREATININE: 4.49 mg/dL — AB (ref 0.44–1.00)
Chloride: 105 mmol/L (ref 98–111)
GFR, EST AFRICAN AMERICAN: 11 mL/min — AB (ref >60)
GFR, EST NON AFRICAN AMERICAN: 9 mL/min — AB (ref >60)
Glucose, Bld: 147 mg/dL — ABNORMAL HIGH (ref 70–99)
PHOSPHORUS: 4.1 mg/dL (ref 2.5–4.6)
Potassium: 3.9 mmol/L (ref 3.5–5.1)
SODIUM: 138 mmol/L (ref 135–145)

## 2018-05-15 LAB — PROTIME-INR
INR: 2.06
Prothrombin Time: 23 seconds — ABNORMAL HIGH (ref 11.4–15.2)

## 2018-05-15 LAB — CBC
HCT: 31.7 % — ABNORMAL LOW (ref 36.0–46.0)
HEMOGLOBIN: 9.4 g/dL — AB (ref 12.0–15.0)
MCH: 23.7 pg — ABNORMAL LOW (ref 26.0–34.0)
MCHC: 29.7 g/dL — ABNORMAL LOW (ref 30.0–36.0)
MCV: 80.1 fL (ref 78.0–100.0)
Platelets: 262 10*3/uL (ref 150–400)
RBC: 3.96 MIL/uL (ref 3.87–5.11)
RDW: 19.9 % — ABNORMAL HIGH (ref 11.5–15.5)
WBC: 6.2 10*3/uL (ref 4.0–10.5)

## 2018-05-15 LAB — CBG MONITORING, ED
GLUCOSE-CAPILLARY: 107 mg/dL — AB (ref 70–99)
GLUCOSE-CAPILLARY: 70 mg/dL (ref 70–99)

## 2018-05-15 NOTE — ED Notes (Signed)
Pt verbalized understanding of discharge instructions and denies any further questions at this time.   

## 2018-05-15 NOTE — Consult Note (Signed)
Telepsych Consultation   Reason for Consult:  Suicidal ideation Referring Physician:  Carmin Muskrat, MD Location of Patient: [facility] Location of Provider: [facility]  Patient Identification: Rhonda Lynch MRN:  [id number] Principal Diagnosis: Substance induced mood disorder [facility]) Diagnosis:   Patient Active Problem List   Diagnosis Date Noted  . Substance induced mood disorder ([place]) [F19.94] 05/15/2018  . Hemodialysis catheter infection, initial encounter ([place]) [T82.7XXA]   . Fungemia [B49] 04/27/2018  . Pulmonary embolism ([place]) [I26.99] 04/27/2018  . Sepsis ([place]) [A41.9] 04/26/2018  . Dyspnea [R06.00] 04/26/2018  . Chest pain [R07.9] 04/26/2018  . Tachycardia [R00.0] 04/26/2018  . Abdominal pain [R10.9] 04/18/2018  . 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 Right flank pain [R10.9] 04/18/2018  . Acute respiratory failure with hypoxia ([place]) [J96.01] 04/12/2018  . SVT (supraventricular tachycardia) ([place]) [I47.1]   . Chronic renal failure [N18.9]   . Diabetes ([place]) [E11.9] 04/02/2018  . Hypertension [I10]   . Noncompliance of patient with renal dialysis ([place]) [Z91.15]   . Acute pulmonary edema ([facility]) [J81.0]   . Elevated troponin [R74.8] 03/19/2018  . AKI (acute kidney injury) ([place]) [N17.9]   . Fever [R50.9]   . 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 Hypoxemia [R09.02]   . Pulmonary infiltrate [R91.8]   . 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 ESRD (end stage renal disease) ([place]) [N18.6] 02/12/2018  . Acute respiratory failure ([place]) [J96.00] 02/12/2018  . Alcohol abuse [F10.10] 02/12/2018  . Anemia [D64.9] 02/12/2018  . Cocaine abuse ([place]) [F14.10] 02/12/2018  . Epigastric pain [R10.13] 02/12/2018  . Pneumonia [J18.9] 02/12/2018  . TOBACCO ABUSE [F17.200] 03/08/2010  . PNEUMONIA [J18.9] 03/08/2010  . UTI [N39.0] 03/08/2010   . ELEVATED BLOOD PRESSURE WITHOUT DIAGNOSIS OF HYPERTENSION [R03.0] 03/08/2010    Total Time spent with patient: 30 minutes  Subjective:   Rhonda Lynch is a 62 y.o. female patient presented to [facility] 05/13/18 with complaints of suicidal ideation, despondency, substance abuse and other medical symptoms.  Patient IVC by her son whom she lives with  HPI:  Rhonda Lynch, 62 y.o., female patient seen via telepsych by this provider; chart reviewed and consulted with Dr. Dwyane Dee on 05/15/18.  On evaluation Rhonda Lynch is sitting on side of bed preparing her lunch.  Patient reports she came to the hospital to get some help.  When asked what type of help she needed; patient responded "any kind I can get."  Patient denies suicidal/self-harm/homicidal ideation, psychosis, and paranoia.  Patient asked if she was using heroin and cocaine "I use cocaine but no heroin."  Patient also denies that she has shot any heroin or cocaine into her dialysis IV port)  Patient states that she smokes crack cocaine "I have used it once in 5 months."  Patient states that she goes to dialysis three times a week on Tuesday, Thursday, and Saturday; and that she has not missed any appointments.  Patient states that she is eating and sleeping without difficulty and tolerating her medications without adverse reactions.  Patient states that she is interested in outpatient psychiatric services and rehab. During evaluation Rhonda Lynch is alert/oriented x 4; calm/cooperative; and mood is congruent with affect.  She does not appear to be responding to internal/external stimuli or delusional thoughts.  Patient denies suicidal/self-harm/homicidal ideation, psychosis, and paranoia.  Patient answered question appropriately.       Past  Psychiatric History: Depression.  No prior outpatient or inpatient psychiatric treatment  Risk to Self: Suicidal Ideation: Yes-Currently Present Suicidal Intent: No Is patient at risk for suicide?:  Yes Suicidal Plan?: No(Pt denied, but stopped taking medication) Access to Means: No What has been your use of drugs/alcohol within the last 12 months?: Cocaine, heroin (per report)(UDS + Cocaine) Other Self Harm Risks: Stopped taking medication Intentional Self Injurious Behavior: None Risk to Others: Homicidal Ideation: No Thoughts of Harm to Others: No Current Homicidal Intent: No Current Homicidal Plan: No Access to Homicidal Means: No History of harm to others?: No Assessment of Violence: None Noted Does patient have access to weapons?: No Criminal Charges Pending?: No Does patient have a court date: No Prior Inpatient Therapy: Prior Inpatient Therapy: No Prior Outpatient Therapy: Prior Outpatient Therapy: No Does patient have an ACCT team?: No Does patient have Intensive In-House Services?  : No Does patient have [facility] services? : No Does patient have P4CC services?: No  Past Medical History:  Past Medical History:  Diagnosis Date  . Chronic kidney disease   . ESRD (end stage renal disease) ([place])   . Hypertension   . Oxygen deficiency    2 L at night  . Substance abuse ([place])    has been clean for 4 months     Past Surgical History:  Procedure Laterality Date  . AV FISTULA PLACEMENT Left 03/05/2018   Procedure: ARTERIOVENOUS (AV) FISTULA CREATION  LEFT UPPER EXTREMITY;  Surgeon: [name], MD;  Location: [place];  Service: Vascular;  Laterality: Left;  . HEMATOMA EVACUATION Left 03/06/2018   Procedure: EVACUATION HEMATOMA LEFT ARM;  Surgeon: Angelia Mould, MD;  Location: [place];  Service: Vascular;  Laterality: Left;  . INSERTION OF DIALYSIS CATHETER Left 04/29/2018   Procedure: INSERTION OF DIALYSIS CATHETER LEFT INTERNAL JUGULAR;  Surgeon: Angelia Mould, MD;  Location: [place];  Service: Vascular;  Laterality: Left;  . IR FLUORO GUIDE CV LINE RIGHT  02/18/2018  . IR FLUORO GUIDE CV LINE RIGHT  02/26/2018  . IR REMOVAL TUN CV CATH W/O FL  04/27/2018  .  IR US GUIDE VASC ACCESS RIGHT  02/18/2018  . TEE WITHOUT CARDIOVERSION N/A 04/29/2018   Procedure: TRANSESOPHAGEAL ECHOCARDIOGRAM (TEE);  Surgeon: Sueanne Margarita, MD;  Location: [facility] ENDOSCOPY;  Service: Cardiovascular;  Laterality: N/A;   Family History:  Family History  Problem Relation Age of Onset  . Autoimmune disease Neg Hx    Family Psychiatric  History: Denies Social History:  Social History   Substance and Sexual Activity  Alcohol Use Yes     Social History   Substance and Sexual Activity  Drug Use Yes  . Types: Cocaine, Heroin   Comment: See notes    Social History   Socioeconomic History  . Marital status: Divorced    Spouse name: Not on file  . Number of children: Not on file  . Years of education: Not on file  . Highest education level: Not on file  Occupational History  . Not on file  Social Needs  . Financial resource strain: Not on file  . Food insecurity:    Worry: Not on file    Inability: Not on file  . Transportation needs:    Medical: Not on file    Non-medical: Not on file  Tobacco Use  . Smoking status: Former Smoker    Packs/day: 0.50    Last attempt to quit: 04/08/2018    Years since quitting:  0.1  . Smokeless tobacco: Never Used  . Tobacco comment: trying to quit 04/02/2018  Substance and Sexual Activity  . Alcohol use: Yes  . Drug use: Yes    Types: Cocaine, Heroin    Comment: See notes  . Sexual activity: Not Currently    Birth control/protection: None  Lifestyle  . Physical activity:    Days per week: Not on file    Minutes per session: Not on file  . Stress: Not on file  Relationships  . Social connections:    Talks on phone: Not on file    Gets together: Not on file    Attends religious service: Not on file    Active member of club or organization: Not on file    Attends meetings of clubs or organizations: Not on file    Relationship status: Not on file  Other Topics Concern  . Not on file  Social History Narrative   Per  notes, Pt lives with her son.   Additional Social History:    Allergies:  No Known Allergies  Labs:  Results for orders placed or performed during the hospital encounter of 05/13/18 (from the past 48 hour(s))  Comprehensive metabolic panel     Status: Abnormal   Collection Time: 05/13/18  6:18 PM  Result Value Ref Range   Sodium 145 135 - 145 mmol/L   Potassium 4.5 3.5 - 5.1 mmol/L   Chloride 108 98 - 111 mmol/L   CO2 23 22 - 32 mmol/L   Glucose, Bld 130 (H) 70 - 99 mg/dL   BUN 40 (H) 8 - 23 mg/dL   Creatinine, Ser 4.69 (H) 0.44 - 1.00 mg/dL   Calcium 8.4 (L) 8.9 - 10.3 mg/dL   Total Protein 7.1 6.5 - 8.1 g/dL   Albumin 2.7 (L) 3.5 - 5.0 g/dL   AST 36 15 - 41 U/L   ALT 22 0 - 44 U/L   Alkaline Phosphatase 120 38 - 126 U/L   Total Bilirubin 0.6 0.3 - 1.2 mg/dL   GFR calc non Af Amer 9 (L) >60 mL/min   GFR calc Af Amer 10 (L) >60 mL/min    Comment: (NOTE) The eGFR has been calculated using the CKD EPI equation. This calculation has not been validated in all clinical situations. eGFR's persistently <60 mL/min signify possible Chronic Kidney Disease.    Anion gap 14 5 - 15    Comment: Performed at [facility] [address] [place], [place] [address]  Ethanol     Status: None   Collection Time: 05/13/18  6:18 PM  Result Value Ref Range   Alcohol, Ethyl (B) <10 <10 mg/dL    Comment: (NOTE) Lowest detectable limit for serum alcohol is 10 mg/dL. For medical purposes only. Performed at [facility], [place] [address], [place], [place] [address]   Salicylate level     Status: None   Collection Time: 05/13/18  6:18 PM  Result Value Ref Range   Salicylate Lvl <0.6 2.8 - 30.0 mg/dL    Comment: Performed at [facility] [address], [place], [place] [address]  Acetaminophen level     Status: Abnormal   Collection Time: 05/13/18  6:18 PM  Result Value Ref Range   Acetaminophen (Tylenol), Serum <10 (L) 10 - 30 ug/mL    Comment: (NOTE) Therapeutic  concentrations vary significantly. A range of 10-30 ug/mL  may be an effective concentration for many patients. However, some  are  best treated at concentrations outside of this range. Acetaminophen concentrations >150 ug/mL at 4 hours after ingestion  and >50 ug/mL at 12 hours after ingestion are often associated with  toxic reactions. Performed at [facility], [place] [address], [place], [place] [address]   cbc     Status: Abnormal   Collection Time: 05/13/18  6:18 PM  Result Value Ref Range   WBC 7.7 4.0 - 10.5 K/uL   RBC 4.26 3.87 - 5.11 MIL/uL   Hemoglobin 10.2 (L) 12.0 - 15.0 g/dL   HCT 34.4 (L) 36.0 - 46.0 %   MCV 80.8 78.0 - 100.0 fL   MCH 23.9 (L) 26.0 - 34.0 pg   MCHC 29.7 (L) 30.0 - 36.0 g/dL   RDW 20.3 (H) 11.5 - 15.5 %   Platelets 365 150 - 400 K/uL    Comment: Performed at [facility], [place] [address], [place], [place] [address]  Rapid urine drug screen (hospital performed)     Status: Abnormal   Collection Time: 05/13/18  9:53 PM  Result Value Ref Range   Opiates NONE DETECTED NONE DETECTED   Cocaine POSITIVE (A) NONE DETECTED   Benzodiazepines NONE DETECTED NONE DETECTED   Amphetamines NONE DETECTED NONE DETECTED   Tetrahydrocannabinol NONE DETECTED NONE DETECTED   Barbiturates NONE DETECTED NONE DETECTED    Comment: (NOTE) DRUG SCREEN FOR MEDICAL PURPOSES ONLY.  IF CONFIRMATION IS NEEDED FOR ANY PURPOSE, NOTIFY LAB WITHIN 5 DAYS. LOWEST DETECTABLE LIMITS FOR URINE DRUG SCREEN Drug Class                     Cutoff (ng/mL) Amphetamine and metabolites    1000 Barbiturate and metabolites    200 Benzodiazepine                 671 Tricyclics and metabolites     300 Opiates and metabolites        300 Cocaine and metabolites        300 THC                            50 Performed at [facility], [place] [address] [place], [place] [address]   Protime-INR     Status: Abnormal   Collection Time: 05/13/18 11:10 PM  Result Value Ref Range    Prothrombin Time 27.9 (H) 11.4 - 15.2 seconds   INR 2.63     Comment: Performed at [facility] [address], [place], [place] [address]  CBG monitoring, ED     Status: Abnormal   Collection Time: 05/13/18 11:11 PM  Result Value Ref Range   Glucose-Capillary 137 (H) 70 - 99 mg/dL  Protime-INR     Status: Abnormal   Collection Time: 05/14/18  4:23 AM  Result Value Ref Range   Prothrombin Time 27.0 (H) 11.4 - 15.2 seconds   INR 2.53     Comment: Performed at [facility], [place] [address] [place], [place] [address]  CBG monitoring, ED     Status: None   Collection Time: 05/14/18  8:18 AM  Result Value Ref Range   Glucose-Capillary 82 70 - 99 mg/dL  CBG monitoring, ED     Status: Abnormal   Collection Time: 05/14/18 12:35 PM  Result Value Ref Range   Glucose-Capillary 144 (H) 70 - 99 mg/dL  Basic metabolic panel     Status: Abnormal  Collection Time: 05/14/18  3:25 PM  Result Value Ref Range   Sodium 145 135 - 145 mmol/L   Potassium 4.4 3.5 - 5.1 mmol/L   Chloride 109 98 - 111 mmol/L   CO2 27 22 - 32 mmol/L   Glucose, Bld 77 70 - 99 mg/dL   BUN 36 (H) 8 - 23 mg/dL   Creatinine, Ser 4.76 (H) 0.44 - 1.00 mg/dL   Calcium 8.4 (L) 8.9 - 10.3 mg/dL   GFR calc non Af Amer 9 (L) >60 mL/min   GFR calc Af Amer 10 (L) >60 mL/min    Comment: (NOTE) The eGFR has been calculated using the CKD EPI equation. This calculation has not been validated in all clinical situations. eGFR's persistently <60 mL/min signify possible Chronic Kidney Disease.    Anion gap 9 5 - 15    Comment: Performed at [facility] [address], [place], [place] [address]  CBG monitoring, ED     Status: Abnormal   Collection Time: 05/14/18  4:22 PM  Result Value Ref Range   Glucose-Capillary 116 (H) 70 - 99 mg/dL  CBG monitoring, ED     Status: Abnormal   Collection Time: 05/14/18 11:23 PM  Result Value Ref Range   Glucose-Capillary 120 (H) 70 - 99 mg/dL  Protime-INR     Status: Abnormal    Collection Time: 05/15/18  3:50 AM  Result Value Ref Range   Prothrombin Time 23.0 (H) 11.4 - 15.2 seconds   INR 2.06     Comment: Performed at [facility], [place] [address], [place], [place] [address]  CBG monitoring, ED     Status: Abnormal   Collection Time: 05/15/18  7:22 AM  Result Value Ref Range   Glucose-Capillary 107 (H) 70 - 99 mg/dL  CBG monitoring, ED     Status: None   Collection Time: 05/15/18 12:27 PM  Result Value Ref Range   Glucose-Capillary 70 70 - 99 mg/dL    Medications:  Current Facility-Administered Medications  Medication Dose Route Frequency Provider Last Rate Last Dose  . acetaminophen (TYLENOL) tablet 650 mg  650 mg Oral Q6H PRN Carmin Muskrat, MD      . calcitRIOL (ROCALTROL) capsule 0.25 mcg  0.25 mcg Oral [place] Dome, MD   0.25 mcg at 05/15/18 1049  . insulin aspart (novoLOG) injection 5 Units  5 Units Subcutaneous TID WC Carmin Muskrat, MD   5 Units at 05/15/18 [id number]  . insulin glargine (LANTUS) injection 5 Units  5 Units Subcutaneous QHS Carmin Muskrat, MD   5 Units at 05/15/18 0004  . metoprolol tartrate (LOPRESSOR) tablet 25 mg  25 mg Oral BID Carmin Muskrat, MD   25 mg at 05/15/18 1048  . ondansetron (ZOFRAN) tablet 4 mg  4 mg Oral Q6H PRN Carmin Muskrat, MD   4 mg at 05/14/18 1615  . warfarin (COUMADIN) tablet 2.5 mg  2.5 mg Oral q1800 Carmin Muskrat, MD   2.5 mg at 05/14/18 2008  . Warfarin - Physician Dosing Inpatient   Does not apply q1800 Carmin Muskrat, MD      . ziprasidone (GEODON) injection 20 mg  20 mg Intramuscular Q12H PRN Carmin Muskrat, MD      . zolpidem [facility]) tablet 5 mg  5 mg Oral QHS PRN Carmin Muskrat, MD   5 mg at 05/13/18 2316   Current Outpatient Medications  Medication Sig Dispense Refill  . acetaminophen (TYLENOL) 325 MG tablet Take 2 tablets (650 mg  total) by mouth every 6 (six) hours as needed for mild pain (or Fever >/= 101).    . calcitRIOL (ROCALTROL) 0.25 MCG capsule Take 1 capsule  (0.25 mcg total) by mouth every other day. 30 capsule 0  . feeding supplement, GLUCERNA SHAKE, (GLUCERNA SHAKE) LIQD Take 237 mLs by mouth 2 (two) times daily between meals. (Patient not taking: Reported on 04/26/2018) 60 Can 0  . fluconazole (DIFLUCAN) 200 MG tablet Take 1 tablet (200 mg total) by mouth daily. 30 tablet 0  . insulin aspart (NOVOLOG) 100 UNIT/ML injection Inject 0-9 Units into the skin 3 (three) times daily with meals. (Patient taking differently: Inject 5 Units into the skin 3 (three) times daily with meals. ) 10 mL 11  . insulin glargine (LANTUS) 100 UNIT/ML injection Inject 0.05 mLs (5 Units total) into the skin at bedtime. 10 mL 11  . metoprolol tartrate (LOPRESSOR) 25 MG tablet Take 1 tablet (25 mg total) by mouth 2 (two) times daily. 60 tablet 0  . multivitamin (RENA-VIT) TABS tablet Take 1 tablet by mouth at bedtime. 30 each 0  . ondansetron (ZOFRAN) 4 MG tablet Take 1 tablet (4 mg total) by mouth every 6 (six) hours as needed for nausea. 20 tablet 0  . warfarin (COUMADIN) 2.5 MG tablet Take 1 tablet (2.5 mg total) by mouth daily at 6 PM. 30 tablet 0  . zolpidem (AMBIEN) 5 MG tablet Take 1 tablet (5 mg total) by mouth at bedtime as needed for sleep. 30 tablet 0    Musculoskeletal: Strength & Muscle Tone: Unable to determine vial tele psych [place]: Did not see patient ambulate; stated that she she uses a walker or cain at time Patient leans: N/A  Psychiatric Specialty Exam: Physical Exam  ROS  Blood pressure (!) 150/89, pulse 84, temperature 98.5 F (36.9 C), temperature source Oral, resp. rate 18, SpO2 99 %.There is no height or weight on file to calculate BMI.  General Appearance: Casual  Eye Contact:  Good  Speech:  Clear and Coherent and Normal Rate  Volume:  Normal  Mood:  Appropriate  Affect:  Appropriate and Congruent  Thought Process:  Coherent and Goal Directed  Orientation:  Full (Time, Place, and Person)  Thought Content:  Denies hallucinations,  delusions, and paranoia  Suicidal Thoughts:  No  Homicidal Thoughts:  No  Memory:  Immediate;   Good Recent;   Good Remote;   Good  Judgement:  Intact  Insight:  Fair  Psychomotor Activity:  Normal  Concentration:  Concentration: Good and Attention Span: Good  Recall:  Good  Fund of Knowledge:  Good  Language:  Good  Akathisia:  No  Handed:  Right  AIMS (if indicated):     Assets:  Communication Skills Desire for Improvement Housing Social Support  ADL's:  Intact  Cognition:  WNL  Sleep:        Treatment Plan Summary: Plan Referral to outpatient psychiatric services  Disposition: Patient psychiatrically cleared   No evidence of imminent risk to self or others at present.   Patient does not meet criteria for psychiatric inpatient admission. Supportive therapy provided about ongoing stressors. Discussed crisis plan, support from social network, calling 911, coming to the Emergency Department, and calling Suicide Hotline.   Spoke with Dr.Yelverton; informed of above recommendation and disposition  This service was provided via telemedicine using a 2-way, interactive audio and video technology.  Names of all persons participating in this telemedicine service and their role in this  encounter. Name: Earleen Newport, NP Role: Tele psych Assessment  Name: Dr. Dwyane Dee Role: Psychiatrist  Name: Rhonda Lynch Role: Patient  Name:  Role:     Earleen Newport, NP 05/15/2018 12:30 PM

## 2018-05-15 NOTE — Consult Note (Addendum)
[facility] Renal Consultation Note  Indication for Consultation:  Management of ESRD/hemodialysis; anemia, hypertension/volume and secondary hyperparathyroidism  HPI: Rhonda Lynch is a 62 y.o. 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   Now presented to ER 05/13/18 with Suicidal Ideations, Cocaine  positive and  under involuntary commitment, which was executed by her son and his fiancee. We were notified today of need for HD  As her last HD was 05/10/18,Sat. Stayed only 1hr 40mn. Of her 4 here Tx ."felt sick" .  Labs 05/14/18 K 4.4  Bun 36, Scr 4.76  CO2 27   hgb 10.2  CXR  05/13/18 = mild residual R basilar atelectasis  / normal pulm vascularity  No cos with little verbal interaction . Noted has LUA AVF not able to use last admit  So perm cath inserted (OP fistulogram 8/30 no show)       Past Medical History:  Diagnosis Date  . Chronic kidney disease   . ESRD (end stage renal disease) ([facility]   . Hypertension   . Oxygen deficiency    2 L at night  . Substance abuse ([place]    has been clean for 4 months     Past Surgical History:  Procedure Laterality Date  . AV FISTULA PLACEMENT Left 03/05/2018   Procedure: ARTERIOVENOUS (AV) FISTULA CREATION  LEFT UPPER EXTREMITY;  Surgeon: ERosetta Posner MD;  [place] [facility] [place]  Service: Vascular;  Laterality: Left;  . HEMATOMA EVACUATION Left 03/06/2018   Procedure: EVACUATION HEMATOMA LEFT ARM;  Surgeon: DAngelia Mould MD;  [place]  Service: Vascular;  Laterality: Left;  . INSERTION OF DIALYSIS CATHETER Left 04/29/2018   Procedure: INSERTION OF DIALYSIS CATHETER LEFT INTERNAL JUGULAR;  Surgeon: DAngelia Mould MD;  [place] [facility]  Service: Vascular;  Laterality:  Left;  . IR FLUORO GUIDE CV LINE RIGHT  02/18/2018  . IR FLUORO GUIDE CV LINE RIGHT  02/26/2018  . IR REMOVAL TUN CV CATH W/O FL  04/27/2018  . IR [place] ACCESS RIGHT  02/18/2018  . TEE WITHOUT CARDIOVERSION N/A 04/29/2018   Procedure: TRANSESOPHAGEAL ECHOCARDIOGRAM (TEE);  Surgeon: TSueanne Margarita MD;  [facility]  Service: Cardiovascular;  Laterality: N/A;      Family History  Problem Relation Age of Onset  . Autoimmune disease Neg Hx       reports that she quit smoking about 5 weeks ago. She smoked 0.50 packs per day. She has never used smokeless tobacco. She reports that she drinks alcohol. She reports that she has current or past drug history. Drugs: Cocaine and Heroin.  No Known Allergies  Prior to Admission medications   Medication Sig Start Date End Date Taking? Authorizing Provider  acetaminophen (TYLENOL) 325 MG tablet Take 2 tablets (650 mg total) by mouth every 6 (six) hours as needed for mild pain (or Fever >/= 101). 04/24/18   Ghimire, SHenreitta Leber MD  calcitRIOL (ROCALTROL) 0.25 MCG capsule Take 1 capsule (0.25 mcg total) by mouth every other day. 04/14/18   SNita Sells MD  feeding supplement, GLUCERNA SHAKE, (GLUCERNA SHAKE) LIQD Take 237 mLs by mouth 2 (two) times daily between meals. Patient not taking: Reported on 04/26/2018 04/24/18   GJonetta Osgood MD  fluconazole (DIFLUCAN) 200 MG tablet Take 1 tablet (200 mg total) by mouth daily. 05/02/18   Shelly Coss, MD  insulin aspart (NOVOLOG) 100 UNIT/ML injection Inject 0-9 Units into the skin 3 (three) times daily with meals. Patient taking differently: Inject 5 Units into the skin 3 (three) times daily with meals.  04/13/18   Nita Sells, MD  insulin glargine (LANTUS) 100 UNIT/ML injection Inject 0.05 mLs (5 Units total) into the skin at bedtime. 04/13/18   Nita Sells, MD  metoprolol tartrate (LOPRESSOR) 25 MG tablet Take 1 tablet (25 mg total) by mouth 2 (two) times daily. 05/02/18    Shelly Coss, MD  multivitamin (RENA-VIT) TABS tablet Take 1 tablet by mouth at bedtime. 04/13/18   Nita Sells, MD  ondansetron (ZOFRAN) 4 MG tablet Take 1 tablet (4 mg total) by mouth every 6 (six) hours as needed for nausea. 04/13/18   Nita Sells, MD  warfarin (COUMADIN) 2.5 MG tablet Take 1 tablet (2.5 mg total) by mouth daily at 6 PM. 05/03/18   Shelly Coss, MD  zolpidem (AMBIEN) 5 MG tablet Take 1 tablet (5 mg total) by mouth at bedtime as needed for sleep. 04/13/18   Nita Sells, MD    XIP:[id number], ondansetron, ziprasidone **AND** [COMPLETED] LORazepam, zolpidem  Results for orders placed or performed during the hospital encounter of 05/13/18 (from the past 48 hour(s))  Comprehensive metabolic panel     Status: Abnormal   Collection Time: 05/13/18  6:18 PM  Result Value Ref Range   Sodium 145 135 - 145 mmol/L   Potassium 4.5 3.5 - 5.1 mmol/L   Chloride 108 98 - 111 mmol/L   CO2 23 22 - 32 mmol/L   Glucose, Bld 130 (H) 70 - 99 mg/dL   BUN 40 (H) 8 - 23 mg/dL   Creatinine, Ser 4.69 (H) 0.44 - 1.00 mg/dL   Calcium 8.4 (L) 8.9 - 10.3 mg/dL   Total Protein 7.1 6.5 - 8.1 g/dL   Albumin 2.7 (L) 3.5 - 5.0 g/dL   AST 36 15 - 41 U/L   ALT 22 0 - 44 U/L   Alkaline Phosphatase 120 38 - 126 U/L   Total Bilirubin 0.6 0.3 - 1.2 mg/dL   GFR calc non Af Amer 9 (L) >60 mL/min   GFR calc Af Amer 10 (L) >60 mL/min    Comment: (NOTE) The eGFR has been calculated using the CKD EPI equation. This calculation has not been validated in all clinical situations. eGFR's persistently <60 mL/min signify possible Chronic Kidney Disease.    Anion gap 14 5 - 15    Comment: Performed at [facility] [address] [place], [place] [address]  Ethanol     Status: None   Collection Time: 05/13/18  6:18 PM  Result Value Ref Range   Alcohol, Ethyl (B) <10 <10 mg/dL    Comment: (NOTE) Lowest detectable limit for serum alcohol is 10 mg/dL. For medical purposes  only. Performed at [facility], [place] [address], [place], [place] [address]   Salicylate level     Status: None   Collection Time: 05/13/18  6:18 PM  Result Value Ref Range   Salicylate Lvl <7.3 2.8 - 30.0 mg/dL    Comment: Performed at [facility] [address], [place], [place] [address]  Acetaminophen level     Status: Abnormal   Collection Time: 05/13/18  6:18 PM  Result Value Ref Range   Acetaminophen (Tylenol), Serum <10 (L)  10 - 30 ug/mL    Comment: (NOTE) Therapeutic concentrations vary significantly. A range of 10-30 ug/mL  may be an effective concentration for many patients. However, some  are best treated at concentrations outside of this range. Acetaminophen concentrations >150 ug/mL at 4 hours after ingestion  and >50 ug/mL at 12 hours after ingestion are often associated with  toxic reactions. Performed at [facility], [place] [address], [place], [place] [address]   cbc     Status: Abnormal   Collection Time: 05/13/18  6:18 PM  Result Value Ref Range   WBC 7.7 4.0 - 10.5 K/uL   RBC 4.26 3.87 - 5.11 MIL/uL   Hemoglobin 10.2 (L) 12.0 - 15.0 g/dL   HCT 34.4 (L) 36.0 - 46.0 %   MCV 80.8 78.0 - 100.0 fL   MCH 23.9 (L) 26.0 - 34.0 pg   MCHC 29.7 (L) 30.0 - 36.0 g/dL   RDW 20.3 (H) 11.5 - 15.5 %   Platelets 365 150 - 400 K/uL    Comment: Performed at [facility], [place] [address] [place], [place] [address]  Rapid urine drug screen (hospital performed)     Status: Abnormal   Collection Time: 05/13/18  9:53 PM  Result Value Ref Range   Opiates NONE DETECTED NONE DETECTED   Cocaine POSITIVE (A) NONE DETECTED   Benzodiazepines NONE DETECTED NONE DETECTED   Amphetamines NONE DETECTED NONE DETECTED   Tetrahydrocannabinol NONE DETECTED NONE DETECTED   Barbiturates NONE DETECTED NONE DETECTED    Comment: (NOTE) DRUG SCREEN FOR MEDICAL PURPOSES ONLY.  IF CONFIRMATION IS NEEDED FOR ANY PURPOSE, NOTIFY LAB WITHIN 5 DAYS. LOWEST DETECTABLE  LIMITS FOR URINE DRUG SCREEN Drug Class                     Cutoff (ng/mL) Amphetamine and metabolites    1000 Barbiturate and metabolites    200 Benzodiazepine                 096 Tricyclics and metabolites     300 Opiates and metabolites        300 Cocaine and metabolites        300 THC                            50 Performed at [facility], [place] [address], [place], [place] [address]   Protime-INR     Status: Abnormal   Collection Time: 05/13/18 11:10 PM  Result Value Ref Range   Prothrombin Time 27.9 (H) 11.4 - 15.2 seconds   INR 2.63     Comment: Performed at [facility] [address] [place], [place] [address]  CBG monitoring, ED     Status: Abnormal   Collection Time: 05/13/18 11:11 PM  Result Value Ref Range   Glucose-Capillary 137 (H) 70 - 99 mg/dL  Protime-INR     Status: Abnormal   Collection Time: 05/14/18  4:23 AM  Result Value Ref Range   Prothrombin Time 27.0 (H) 11.4 - 15.2 seconds   INR 2.53     Comment: Performed at [facility], [place] [address] [place], [place] [address]  CBG monitoring, ED     Status: None   Collection Time: 05/14/18  8:18 AM  Result Value Ref Range   Glucose-Capillary 82 70 - 99 mg/dL  CBG monitoring, ED     Status: Abnormal  Collection Time: 05/14/18 12:35 PM  Result Value Ref Range   Glucose-Capillary 144 (H) 70 - 99 mg/dL  Basic metabolic panel     Status: Abnormal   Collection Time: 05/14/18  3:25 PM  Result Value Ref Range   Sodium 145 135 - 145 mmol/L   Potassium 4.4 3.5 - 5.1 mmol/L   Chloride 109 98 - 111 mmol/L   CO2 27 22 - 32 mmol/L   Glucose, Bld 77 70 - 99 mg/dL   BUN 36 (H) 8 - 23 mg/dL   Creatinine, Ser 4.76 (H) 0.44 - 1.00 mg/dL   Calcium 8.4 (L) 8.9 - 10.3 mg/dL   GFR calc non Af Amer 9 (L) >60 mL/min   GFR calc Af Amer 10 (L) >60 mL/min    Comment: (NOTE) The eGFR has been calculated using the CKD EPI equation. This calculation has not been validated in all clinical situations. eGFR's  persistently <60 mL/min signify possible Chronic Kidney Disease.    Anion gap 9 5 - 15    Comment: Performed at [facility] [address], [place], [place] [address]  CBG monitoring, ED     Status: Abnormal   Collection Time: 05/14/18  4:22 PM  Result Value Ref Range   Glucose-Capillary 116 (H) 70 - 99 mg/dL  CBG monitoring, ED     Status: Abnormal   Collection Time: 05/14/18 11:23 PM  Result Value Ref Range   Glucose-Capillary 120 (H) 70 - 99 mg/dL  Protime-INR     Status: Abnormal   Collection Time: 05/15/18  3:50 AM  Result Value Ref Range   Prothrombin Time 23.0 (H) 11.4 - 15.2 seconds   INR 2.06     Comment: Performed at [facility], [place] [address] [place], [place] [address]  CBG monitoring, ED     Status: Abnormal   Collection Time: 05/15/18  7:22 AM  Result Value Ref Range   Glucose-Capillary 107 (H) 70 - 99 mg/dL    ROS: little interaction denies CP, sob, fevers, chills GI symptoms ,dizzines .   Physical Exam: Vitals:   05/14/18 2315 05/15/18 0604  BP: 136/77 (!) 150/89  Pulse: 87 84  Resp: 18 18  Temp: 99.1 F (37.3 C) 98.5 F (36.9 C)  SpO2: 96% 99%     General: alert, Now  calm,withdrawen  Female  HEENT: [place], MMM, non icteric  Neck: no jvd, supple Heart: RRR ,no rub,mur, gallop[ Lungs: CTA Abdomen:bs pos , soft , NT, ND  Extremities: trace pedal edema Skin: no overt rash Neuro: OX3, moves all extrem  To request , currently calm  Dialysis Access: R IJ Permcath, LUA AVF pos bruit/ not developed well  Dialysis Orders:  TTS [facility] 4h 60kg 2/2.25 bath  edw 58kg   Hep 2000 - venofer 100 every HD x 10 -Mircera 150 q 2 - last dose 04/26/17 - Calcitriol 0.25 ug po  qhd      LAst op hd labs  = hgb 8.9  (05/06/18)  Ca /phos = 8.0/5.1  PTH 635 (04/05/18)  Assessment/Plan 1. ESRD -  HD on TTS , last hd  05/10/18 ,hd today  2. Suicidal ideation / Depression - per [facility] staff , for Involuntary commitment  3. Hypertension/volume  -  uf 2l on  hd  4. Anemia  - hgb  10.2 give Aranesp today  5. Metabolic bone disease -  Calcitriol 0.34mg  Po hd  6. DM type 2 - per admit team 7.  Substance abuse - per [place], PA-C [place] [phone] 05/15/2018, 11:37 AM   Pt seen, examined and agree w A/P as above.  Kelly Splinter MD [employer] pager [phone]   05/15/2018, 2:22 PM

## 2018-05-15 NOTE — ED Notes (Signed)
PA Zey[place]ang with Nephrology at bedside.

## 2018-05-15 NOTE — ED Notes (Signed)
Renal Diet was ordered for Dinner. Requested for Dinner to Be delivered at [facility].

## 2018-05-15 NOTE — ED Notes (Signed)
Per Phineas Semen, RN pt completed 4 hours of treatment. 2.6L. VSS no meds were given.    Pt is aware that she is being discharge. She is attempting to call her guardian for transportation.

## 2018-05-15 NOTE — Procedures (Signed)
   I was present at this dialysis session, have reviewed the session itself and made  appropriate changes Kelly Splinter MD [facility] pager [phone]   05/15/2018, 4:07 PM

## 2018-05-15 NOTE — ED Notes (Signed)
Patient transported to Hemodialysis with sitter at bedside.

## 2018-05-15 NOTE — ED Notes (Signed)
TTS at bedside.  Lunch tray delivered

## 2018-05-15 NOTE — ED Notes (Signed)
Regular Diet was ordered for Lunch. 

## 2018-05-16 LAB — HEPATITIS B SURFACE ANTIGEN: Hepatitis B Surface Ag: NEGATIVE

## 2018-05-20 ENCOUNTER — Telehealth (INDEPENDENT_AMBULATORY_CARE_PROVIDER_SITE_OTHER): Payer: Self-pay | Admitting: Physician Assistant

## 2018-05-20 NOTE — Telephone Encounter (Signed)
Opened in error

## 2018-05-24 ENCOUNTER — Encounter (HOSPITAL_COMMUNITY): Payer: Self-pay | Admitting: *Deleted

## 2018-05-24 ENCOUNTER — Emergency Department (HOSPITAL_COMMUNITY)
Admission: EM | Admit: 2018-05-24 | Discharge: 2018-05-24 | Disposition: A | Discharge status: Home / Self Care | Payer: Medicaid Other | Attending: Emergency Medicine | Admitting: Emergency Medicine

## 2018-05-24 ENCOUNTER — Emergency Department (HOSPITAL_COMMUNITY): Payer: Medicaid Other

## 2018-05-24 DIAGNOSIS — R079 Chest pain, unspecified: Secondary | ICD-10-CM | POA: Diagnosis not present

## 2018-05-24 DIAGNOSIS — Z5321 Procedure and treatment not carried out due to patient leaving prior to being seen by health care provider: Secondary | ICD-10-CM | POA: Insufficient documentation

## 2018-05-24 LAB — CBC
HEMATOCRIT: 36.9 % (ref 36.0–46.0)
HEMOGLOBIN: 11.1 g/dL — AB (ref 12.0–15.0)
MCH: 24.3 pg — ABNORMAL LOW (ref 26.0–34.0)
MCHC: 30.1 g/dL (ref 30.0–36.0)
MCV: 80.7 fL (ref 78.0–100.0)
Platelets: 205 10*3/uL (ref 150–400)
RBC: 4.57 MIL/uL (ref 3.87–5.11)
RDW: 20.2 % — ABNORMAL HIGH (ref 11.5–15.5)
WBC: 7.6 10*3/uL (ref 4.0–10.5)

## 2018-05-24 LAB — BASIC METABOLIC PANEL WITH GFR
Anion gap: 12 (ref 5–15)
BUN: 14 mg/dL (ref 8–23)
CO2: 25 mmol/L (ref 22–32)
Calcium: 7.9 mg/dL — ABNORMAL LOW (ref 8.9–10.3)
Chloride: 100 mmol/L (ref 98–111)
Creatinine, Ser: 1.62 mg/dL — ABNORMAL HIGH (ref 0.44–1.00)
GFR calc Af Amer: 38 mL/min — ABNORMAL LOW
GFR calc non Af Amer: 33 mL/min — ABNORMAL LOW
Glucose, Bld: 70 mg/dL (ref 70–99)
Potassium: 3.2 mmol/L — ABNORMAL LOW (ref 3.5–5.1)
Sodium: 137 mmol/L (ref 135–145)

## 2018-05-24 LAB — I-STAT TROPONIN, ED: Troponin i, poc: 0.03 ng/mL (ref 0.00–0.08)

## 2018-05-24 NOTE — ED Triage Notes (Signed)
Pt in via EMS to triage, c/o chest pain that has been intermittent for the last week, this episode started at dialysis and worsened after treatment, seen last week for same, pain is worse at dialysis catheter in her chest, denies shortness of breath or n/v

## 2018-05-24 NOTE — ED Notes (Signed)
Called pt with no response

## 2018-05-24 NOTE — ED Notes (Signed)
Called patient multiple times w/ no response

## 2018-06-03 ENCOUNTER — Telehealth (INDEPENDENT_AMBULATORY_CARE_PROVIDER_SITE_OTHER): Payer: Self-pay | Admitting: Physician Assistant

## 2018-06-03 NOTE — Telephone Encounter (Signed)
FWD to PCP. Patient has medicaid which pays for Accu check aviva. Nat Christen, CMA

## 2018-06-03 NOTE — Telephone Encounter (Signed)
Apolonio Schneiders from [facility] called in regard of Ms.Brach stating that Ms. Wolfrey is requesting a Glucometer Meter and would like for it to be sent to [facility] on [address]. Apolonio Schneiders also stated that the Ms. Mccleary missed two of her home health visit on 9-19 and on 9-23.  Please Advice [phone] Apolonio Schneiders)  Thank you Emmit Pomfret

## 2018-06-04 ENCOUNTER — Inpatient Hospital Stay: Payer: Medicaid Other | Admitting: Internal Medicine

## 2018-06-05 ENCOUNTER — Telehealth (INDEPENDENT_AMBULATORY_CARE_PROVIDER_SITE_OTHER): Payer: Self-pay | Admitting: Physician Assistant

## 2018-06-05 ENCOUNTER — Other Ambulatory Visit (INDEPENDENT_AMBULATORY_CARE_PROVIDER_SITE_OTHER): Payer: Self-pay | Admitting: Physician Assistant

## 2018-06-05 DIAGNOSIS — E1121 Type 2 diabetes mellitus with diabetic nephropathy: Secondary | ICD-10-CM

## 2018-06-05 MED ORDER — GLUCOSE BLOOD VI STRP: ORAL_STRIP | 5 refills | Status: DC

## 2018-06-05 MED ORDER — ACCU-CHEK SOFT TOUCH LANCETS MISC: 5 refills | Status: DC

## 2018-06-05 MED ORDER — ACCU-CHEK AVIVA DEVI: 0 refills | Status: DC

## 2018-06-05 NOTE — Telephone Encounter (Signed)
Sent to [facility]

## 2018-06-05 NOTE — Telephone Encounter (Signed)
Noted and approved

## 2018-06-05 NOTE — Telephone Encounter (Signed)
Melanie from [facility] called to request verbal orders for 1 more visit for discharge. Front desk consulted with CMA and was advices to give order.   Threasa Beards [phone]  Thank you Emmit Pomfret

## 2018-06-05 NOTE — Progress Notes (Unsigned)
accu

## 2018-06-05 NOTE — Telephone Encounter (Signed)
Angel from [facility] called stating that [name] has been non compliance with her skill nursing visit. Patient has cancelled 3 times and she is not answering calls or returning calls. Rhonda Lynch states that she will try to visit her today or possibly tomorrow. Rhonda Lynch also wants to know the status of her glucometer.  Please advice [phone] Rhonda Lynch )  Thank you Emmit Pomfret

## 2018-06-06 NOTE — Telephone Encounter (Signed)
Angel from [facility] called today to inform that she will be discharged as of today due to non compliance. Nat Christen, CMA

## 2018-06-06 NOTE — Telephone Encounter (Signed)
Noted  

## 2018-06-07 ENCOUNTER — Emergency Department (HOSPITAL_COMMUNITY)
Admission: EM | Admit: 2018-06-07 | Discharge: 2018-06-08 | Disposition: A | Discharge status: Home / Self Care | Payer: Medicaid Other | Attending: Emergency Medicine | Admitting: Emergency Medicine

## 2018-06-07 ENCOUNTER — Emergency Department (HOSPITAL_COMMUNITY): Payer: Medicaid Other

## 2018-06-07 ENCOUNTER — Encounter (HOSPITAL_COMMUNITY): Payer: Self-pay | Admitting: Emergency Medicine

## 2018-06-07 DIAGNOSIS — R45851 Suicidal ideations: Secondary | ICD-10-CM | POA: Diagnosis not present

## 2018-06-07 DIAGNOSIS — Z992 Dependence on renal dialysis: Secondary | ICD-10-CM

## 2018-06-07 DIAGNOSIS — Z794 Long term (current) use of insulin: Secondary | ICD-10-CM | POA: Diagnosis not present

## 2018-06-07 DIAGNOSIS — Z79899 Other long term (current) drug therapy: Secondary | ICD-10-CM | POA: Insufficient documentation

## 2018-06-07 DIAGNOSIS — N186 End stage renal disease: Secondary | ICD-10-CM | POA: Diagnosis not present

## 2018-06-07 DIAGNOSIS — F322 Major depressive disorder, single episode, severe without psychotic features: Secondary | ICD-10-CM | POA: Insufficient documentation

## 2018-06-07 DIAGNOSIS — E1122 Type 2 diabetes mellitus with diabetic chronic kidney disease: Secondary | ICD-10-CM | POA: Diagnosis not present

## 2018-06-07 DIAGNOSIS — I120 Hypertensive chronic kidney disease with stage 5 chronic kidney disease or end stage renal disease: Secondary | ICD-10-CM | POA: Insufficient documentation

## 2018-06-07 DIAGNOSIS — Z87891 Personal history of nicotine dependence: Secondary | ICD-10-CM | POA: Insufficient documentation

## 2018-06-07 DIAGNOSIS — F329 Major depressive disorder, single episode, unspecified: Secondary | ICD-10-CM

## 2018-06-07 DIAGNOSIS — Z7901 Long term (current) use of anticoagulants: Secondary | ICD-10-CM | POA: Diagnosis not present

## 2018-06-07 DIAGNOSIS — F32A Depression, unspecified: Secondary | ICD-10-CM

## 2018-06-07 LAB — COMPREHENSIVE METABOLIC PANEL WITH GFR
ALT: 20 U/L (ref 0–44)
AST: 27 U/L (ref 15–41)
Albumin: 3.1 g/dL — ABNORMAL LOW (ref 3.5–5.0)
Alkaline Phosphatase: 86 U/L (ref 38–126)
Anion gap: 10 (ref 5–15)
BUN: 42 mg/dL — ABNORMAL HIGH (ref 8–23)
CO2: 21 mmol/L — ABNORMAL LOW (ref 22–32)
Calcium: 8.6 mg/dL — ABNORMAL LOW (ref 8.9–10.3)
Chloride: 108 mmol/L (ref 98–111)
Creatinine, Ser: 4.26 mg/dL — ABNORMAL HIGH (ref 0.44–1.00)
GFR calc Af Amer: 12 mL/min — ABNORMAL LOW
GFR calc non Af Amer: 10 mL/min — ABNORMAL LOW
Glucose, Bld: 128 mg/dL — ABNORMAL HIGH (ref 70–99)
Potassium: 3.5 mmol/L (ref 3.5–5.1)
Sodium: 139 mmol/L (ref 135–145)
Total Bilirubin: 0.5 mg/dL (ref 0.3–1.2)
Total Protein: 7 g/dL (ref 6.5–8.1)

## 2018-06-07 LAB — SALICYLATE LEVEL

## 2018-06-07 LAB — CBC
HCT: 39.8 % (ref 36.0–46.0)
Hemoglobin: 12.1 g/dL (ref 12.0–15.0)
MCH: 24 pg — ABNORMAL LOW (ref 26.0–34.0)
MCHC: 30.4 g/dL (ref 30.0–36.0)
MCV: 78.8 fL (ref 78.0–100.0)
Platelets: 224 10*3/uL (ref 150–400)
RBC: 5.05 MIL/uL (ref 3.87–5.11)
RDW: 19 % — ABNORMAL HIGH (ref 11.5–15.5)
WBC: 8 10*3/uL (ref 4.0–10.5)

## 2018-06-07 LAB — ACETAMINOPHEN LEVEL

## 2018-06-07 LAB — ETHANOL: Alcohol, Ethyl (B): <10 mg/dL

## 2018-06-07 MED ORDER — ACETAMINOPHEN 325 MG PO TABS: 650.0000 mg | ORAL_TABLET | ORAL | Status: DC | PRN

## 2018-06-07 MED ORDER — INSULIN GLARGINE 100 UNIT/ML ~~LOC~~ SOLN
5.0000 [IU] | Freq: Every day | SUBCUTANEOUS | Status: DC
  Administered 2018-06-08: 5 [IU] via SUBCUTANEOUS
  Filled 2018-06-07 (×3): qty 0.05

## 2018-06-07 MED ORDER — ZOLPIDEM TARTRATE 5 MG PO TABS
5.0000 mg | ORAL_TABLET | Freq: Every evening | ORAL | Status: DC | PRN
  Administered 2018-06-08: 5 mg via ORAL
  Filled 2018-06-07: qty 1

## 2018-06-07 MED ORDER — ONDANSETRON HCL 4 MG PO TABS: 4.0000 mg | ORAL_TABLET | Freq: Four times a day (QID) | ORAL | Status: DC | PRN

## 2018-06-07 MED ORDER — RENA-VITE PO TABS
1.0000 | ORAL_TABLET | Freq: Every day | ORAL | Status: DC
  Administered 2018-06-08: 1 via ORAL
  Filled 2018-06-07: qty 1

## 2018-06-07 MED ORDER — INSULIN ASPART 100 UNIT/ML ~~LOC~~ SOLN: 0.0000 [IU] | Freq: Three times a day (TID) | SUBCUTANEOUS | Status: DC

## 2018-06-07 MED ORDER — METOPROLOL TARTRATE 25 MG PO TABS
25.0000 mg | ORAL_TABLET | Freq: Two times a day (BID) | ORAL | Status: DC
  Administered 2018-06-08 (×2): 25 mg via ORAL
  Filled 2018-06-07 (×2): qty 1

## 2018-06-07 MED ORDER — WARFARIN SODIUM 2.5 MG PO TABS
2.5000 mg | ORAL_TABLET | Freq: Every day | ORAL | Status: DC
  Filled 2018-06-07: qty 1

## 2018-06-07 NOTE — ED Triage Notes (Signed)
Patient in wine-colored scrubs and wanded by security. Informed patient of medical clearance process and provided emotional support. Patient calm, cooperative.

## 2018-06-07 NOTE — ED Triage Notes (Signed)
Patient to ED from home stating, "I am depressed and tired. I need to be seen." Patient tearful in triage - reports depressing thoughts and "feels like no one cares about her and she is a burden on her son, whom she lives with. Patient reports recently being dependent on dialysis and because she is losing her health, is losing her will to live. She denies specific plan to kill herself, but reports purposely not taking her insulin and missing dialysis because "she doesn't want to do it anymore." She reports cocaine use and picked up drinking again today (1 beer). She is A&O x 4. Patient has dialysis T/TH/Sa - last had it Tuesday.   Patient also has productive cough, hacking and coughing in triage. Denies pain at this time.

## 2018-06-07 NOTE — ED Provider Notes (Signed)
[facility] EMERGENCY DEPARTMENT Provider Note   CSN: [id number] Arrival date & time: 06/07/18  1727     History   Chief Complaint Chief Complaint  Patient presents with  . Suicidal    HPI Rhonda Lynch is a 62 y.o. female.  HPI 62 year old female presents today complaining of depression and suicidality.  She states that she has become depressed since she was diagnosed with end-stage renal disease in June and has been on dialysis.  She has not been to dialysis since Tuesday.  She states that she has not gone because of her depression and wishes to harm herself.  She denies any other purposeful harm plans.  She states she did drink alcohol in the past but has not been drinking since her diagnosis.  She states that the only time she was depressed in the past was in June when she was diagnosed with this.  She is currently complaining of depression.  She denies headache, head injury, neck pain, chest pain, shortness of breath, abdominal pain, nausea, vomiting, or diarrhea. Past Medical History:  Diagnosis Date  . Chronic kidney disease   . ESRD (end stage renal disease) ([place])   . Hypertension   . Oxygen deficiency    2 L at night  . Substance abuse ([place])    has been clean for 4 months     Patient Active Problem List   Diagnosis Date Noted  . Substance induced mood disorder ([place]) 05/15/2018  . Agitation   . Hemodialysis catheter infection, initial encounter ([place])   . 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 Pulmonary embolism ([place]) 04/27/2018  . Sepsis ([place]) 04/26/2018  . Dyspnea 04/26/2018  . Chest pain 04/26/2018  . Tachycardia 04/26/2018  . Abdominal pain 04/18/2018  . DM (diabetes mellitus), type 2 with renal complications ([place]) 09/98/3382  . Right flank pain 04/18/2018  . Acute respiratory failure with hypoxia ([place]) 04/12/2018  . SVT (supraventricular tachycardia) ([place])   . Chronic renal failure   . Diabetes ([place]) 04/02/2018  . Hypertension   . Noncompliance of  patient with renal dialysis ([place])   . Acute pulmonary edema (H[facility])   . Elevated troponin 03/19/2018  . AKI (acute kidney injury) ([place])   . Fever   . 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 Diffuse pulmonary alveolar hemorrhage   . Hypoxemia   . Pulmonary infiltrate   . 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 ESRD (end stage renal disease) ([place]) 02/12/2018  . Acute respiratory failure ([place]) 02/12/2018  . Alcohol abuse 02/12/2018  . Anemia 02/12/2018  . Cocaine abuse ([place]) 02/12/2018  . Epigastric pain 02/12/2018  . Pneumonia 02/12/2018  . TOBACCO ABUSE 03/08/2010  . PNEUMONIA 03/08/2010  . UTI 03/08/2010  . ELEVATED BLOOD PRESSURE WITHOUT DIAGNOSIS OF HYPERTENSION 03/08/2010    Past Surgical History:  Procedure Laterality Date  . AV FISTULA PLACEMENT Left 03/05/2018   Procedure: ARTERIOVENOUS (AV) FISTULA CREATION  LEFT UPPER EXTREMITY;  Surgeon: [name], MD;  Location: [facility];  Service: Vascular;  Laterality: Left;  . HEMATOMA EVACUATION Left 03/06/2018   Procedure: EVACUATION HEMATOMA LEFT ARM;  Surgeon: Angelia Mould, MD;  Location: [facility];  Service: Vascular;  Laterality: Left;  . INSERTION OF DIALYSIS CATHETER Left 04/29/2018   Procedure: INSERTION OF DIALYSIS CATHETER LEFT INTERNAL JUGULAR;  Surgeon: Angelia Mould, MD;  Location: [facility];  Service: Vascular;  Laterality: Left;  . IR FLUORO GUIDE CV LINE RIGHT  02/18/2018  . IR FLUORO  GUIDE CV LINE RIGHT  02/26/2018  . IR REMOVAL TUN CV CATH W/O FL  04/27/2018  . IR US GUIDE VASC ACCESS RIGHT  02/18/2018  . TEE WITHOUT CARDIOVERSION N/A 04/29/2018   Procedure: TRANSESOPHAGEAL ECHOCARDIOGRAM (TEE);  Surgeon: Sueanne Margarita, MD;  Location: [facility] ENDOSCOPY;  Service: Cardiovascular;  Laterality: N/A;     OB History   None      Home Medications    Prior to Admission medications   Medication Sig Start Date End Date Taking? Authorizing Provider  acetaminophen (TYLENOL) 325 MG tablet Take 2  tablets (650 mg total) by mouth every 6 (six) hours as needed for mild pain (or Fever >/= 101). 04/24/18   Ghimire, Henreitta Leber, MD  Blood Glucose Monitoring Suppl (ACCU-CHEK AVIVA) device Use as instructed 06/05/18 06/05/19  Clent Demark, PA-C  calcitRIOL (ROCALTROL) 0.25 MCG capsule Take 1 capsule (0.25 mcg total) by mouth every other day. 04/14/18   Nita Sells, MD  feeding supplement, GLUCERNA SHAKE, (GLUCERNA SHAKE) LIQD Take 237 mLs by mouth 2 (two) times daily between meals. Patient not taking: Reported on 04/26/2018 04/24/18   Jonetta Osgood, MD  fluconazole (DIFLUCAN) 200 MG tablet Take 1 tablet (200 mg total) by mouth daily. 05/02/18   Shelly Coss, MD  glucose blood (ACCU-CHEK AVIVA) test strip Use twice per day. 06/05/18   Clent Demark, PA-C  insulin aspart (NOVOLOG) 100 UNIT/ML injection Inject 0-9 Units into the skin 3 (three) times daily with meals. 04/13/18   Nita Sells, MD  insulin glargine (LANTUS) 100 UNIT/ML injection Inject 0.05 mLs (5 Units total) into the skin at bedtime. 04/13/18   Nita Sells, MD  Lancets (ACCU-CHEK SOFT TOUCH) lancets Use twice per day. 06/05/18   Clent Demark, PA-C  metoprolol tartrate (LOPRESSOR) 25 MG tablet Take 1 tablet (25 mg total) by mouth 2 (two) times daily. 05/02/18   Shelly Coss, MD  multivitamin (RENA-VIT) TABS tablet Take 1 tablet by mouth at bedtime. 04/13/18   Nita Sells, MD  ondansetron (ZOFRAN) 4 MG tablet Take 1 tablet (4 mg total) by mouth every 6 (six) hours as needed for nausea. 04/13/18   Nita Sells, MD  warfarin (COUMADIN) 2.5 MG tablet Take 1 tablet (2.5 mg total) by mouth daily at 6 PM. 05/03/18   Shelly Coss, MD  zolpidem (AMBIEN) 5 MG tablet Take 1 tablet (5 mg total) by mouth at bedtime as needed for sleep. 04/13/18   Nita Sells, MD    Family History Family History  Problem Relation Age of Onset  . Autoimmune disease Neg Hx     Social History Social  History   Tobacco Use  . Smoking status: Former Smoker    Packs/day: 0.50    Last attempt to quit: 04/08/2018    Years since quitting: 0.1  . Smokeless tobacco: Never Used  . Tobacco comment: trying to quit 04/02/2018  Substance Use Topics  . Alcohol use: Yes  . Drug use: Yes    Types: Cocaine, Heroin    Comment: See notes     Allergies   Patient has no known allergies.   Review of Systems Review of Systems  All other systems reviewed and are negative.    Physical Exam Updated Vital Signs BP (!) 157/89 (BP Location: Right Arm)   Pulse (!) 107   Temp 98.6 F (37 C)   Resp 18   SpO2 100%   Physical Exam  Constitutional: She is oriented to person, place, and time. She  appears well-developed and well-nourished.  HENT:  Head: Normocephalic and atraumatic.  Right Ear: External ear normal.  Left Ear: External ear normal.  Mouth/Throat: Oropharynx is clear and moist.  Eyes: Pupils are equal, round, and reactive to light.  Neck: Normal range of motion.  Cardiovascular: Normal rate and regular rhythm.  Pulmonary/Chest: Effort normal.  Abdominal: Soft. She exhibits distension.  Musculoskeletal: Normal range of motion.  Neurological: She is alert and oriented to person, place, and time.  Skin: Skin is warm and dry. Capillary refill takes less than 2 seconds.  Psychiatric: Her speech is normal and behavior is normal. Judgment and thought content normal. Cognition and memory are normal. She exhibits a depressed mood.  Nursing note and vitals reviewed.    ED Treatments / Results  Labs (all labs ordered are listed, but only abnormal results are displayed) Labs Reviewed  COMPREHENSIVE METABOLIC PANEL  ETHANOL  SALICYLATE LEVEL  ACETAMINOPHEN LEVEL  CBC  RAPID URINE DRUG SCREEN, HOSP PERFORMED    EKG None  Radiology Dg Chest 2 View  Result Date: 06/07/2018 CLINICAL DATA:  Productive cough. EXAM: CHEST - 2 VIEW COMPARISON:  Radiographs of May 24, 2018.  FINDINGS: Stable cardiomegaly. Right internal jugular dialysis catheter is unchanged in position. No pneumothorax or pleural effusion is noted. Left lung is clear. Atherosclerosis of thoracic aorta is noted. Stable right basilar opacity is noted most consistent with pneumonia. Bony thorax is unremarkable. IMPRESSION: Stable right basilar opacity is noted most consistent with pneumonia. Electronically Signed   By: Marijo Conception, M.D.   On: 06/07/2018 19:27    Procedures Procedures (including critical care time)  Medications Ordered in ED Medications - No data to display   Initial Impression / Assessment and Plan / ED Course  I have reviewed the triage vital signs and the nursing notes.  Pertinent labs & imaging results that were available during my care of the patient were reviewed by me and considered in my medical decision making (see chart for details).    62 year old female presents today complaining of depression and suicidality.  She is on dialysis and states this is the cause of her depression.  She has not been to dialysis since Tuesday, having missed Thursday and today.  However, she does not appear volume overloaded and is hemodynamically stable with normal potassium.  Plan IVC papers and will have TTS evaluate. Will consult nephrology for dialysis if patient held- patient agrees to dialysis here Final Clinical Impressions(s) / ED Diagnoses   Final diagnoses:  Depression, unspecified depression type  Suicidal thoughts  ESRD (end stage renal disease) on dialysis [facility])    ED Discharge Orders    None       Pattricia Boss, MD 06/09/18 [phone]

## 2018-06-07 NOTE — BH Assessment (Signed)
Tele Assessment Note   Patient Name: FAVIOLA KLARE MRN: [id number] Referring Physician: Dr. Pattricia Boss Location of Patient: [facility] Location of Provider: [facility]  ZANDRIA WOLDT is an 62 y.o. female.  -Clinician reviewed note by Dr. Pattricia Boss.  Pt is a 62 year old female presents today complaining of depression and suicidality.  She states that she has become depressed since she was diagnosed with end-stage renal disease in June and has been on dialysis.  She has not been to dialysis since Tuesday.  She states that she has not gone because of her depression and wishes to harm herself.  She denies any other purposeful harm plans.  She states she did drink alcohol in the past but has not been drinking since her diagnosis.  She states that the only time she was depressed in the past was in June when she was diagnosed with this.  She is currently complaining of depression.  Clinician talked to patient about why she was at [facility].  She said that she had taken the bus to get to hospital and wanted help.  She is depressed about her health deteriorating.  When asked if she wanted to kill herself she said "No, I just want to die."  When asked how she would do this she said I stop doing anything.  She has missed going to dialysis on Thursday.  She has been dropped from home health because she doesn't return calls or let the nurse see her.  Patient said that she has been depressed for the last few months since the ESRD diagnosis.  Patient used ETOH (1 beer today) for the first time in 3 months.  Also smoked some crack yesterday.   Patient denies any HI or A/V hallucinations.  Patient says she has trouble sleeping, is restless and anxious.  Pt has poor eye contact and reports poor concentration and a feeling of hopelessness.  Patient has no prior mental health history per her report.    -Clinician discussed patient care with Patriciaann Clan, PA.  He recommends inpatient psychiatric  care.  Clinician informed Dr. Jeanell Sparrow of disposition.  She said that patient would be dialyzed in the morning (09/29).  TTS to seek placement.  Diagnosis: F32.2 MDD single episode severe; F10.10 ETOH use d/o mild  Past Medical History:  Past Medical History:  Diagnosis Date  . Chronic kidney disease   . ESRD (end stage renal disease) ([place])   . Hypertension   . Oxygen deficiency    2 L at night  . Substance abuse ([place])    has been clean for 4 months     Past Surgical History:  Procedure Laterality Date  . AV FISTULA PLACEMENT Left 03/05/2018   Procedure: ARTERIOVENOUS (AV) FISTULA CREATION  LEFT UPPER EXTREMITY;  Surgeon: [name], MD;  Location: [place];  Service: Vascular;  Laterality: Left;  . HEMATOMA EVACUATION Left 03/06/2018   Procedure: EVACUATION HEMATOMA LEFT ARM;  Surgeon: Angelia Mould, MD;  Location: [place];  Service: Vascular;  Laterality: Left;  . INSERTION OF DIALYSIS CATHETER Left 04/29/2018   Procedure: INSERTION OF DIALYSIS CATHETER LEFT INTERNAL JUGULAR;  Surgeon: Angelia Mould, MD;  Location: [place];  Service: Vascular;  Laterality: Left;  . IR FLUORO GUIDE CV LINE RIGHT  02/18/2018  . IR FLUORO GUIDE CV LINE RIGHT  02/26/2018  . IR REMOVAL TUN CV CATH W/O FL  04/27/2018  . IR US GUIDE VASC ACCESS RIGHT  02/18/2018  . TEE  WITHOUT CARDIOVERSION N/A 04/29/2018   Procedure: TRANSESOPHAGEAL ECHOCARDIOGRAM (TEE);  Surgeon: Sueanne Margarita, MD;  Location: [facility] ENDOSCOPY;  Service: Cardiovascular;  Laterality: N/A;    Family History:  Family History  Problem Relation Age of Onset  . Autoimmune disease Neg Hx     Social History:  reports that she quit smoking about 1 months ago. She smoked 0.50 packs per day. She has never used smokeless tobacco. She reports that she drinks alcohol. She reports that she has current or past drug history. Drugs: Cocaine and Heroin.  Additional Social History:  Alcohol / Drug Use Pain Medications: See PTA  medications Prescriptions: See PTA medication list Over the Counter: see PTA medication list History of alcohol / drug use?: Yes Substance #1 Name of Substance 1: ETOH (beer) 1 - Age of First Use: Unknown 1 - Amount (size/oz): One beer today 1 - Frequency: Today 1 - Duration: Last beer before this was 3 months ago. 1 - Last Use / Amount: 09/28 Substance #2 Name of Substance 2: Cocaine (crack) 2 - Age of First Use: In her 30's 2 - Amount (size/oz): One rock 2 - Frequency: Yesterday  2 - Duration: Frst time in a few months 2 - Last Use / Amount: 09/27  CIWA: CIWA-Ar BP: (!) 157/89 Pulse Rate: (!) 107 COWS:    Allergies: No Known Allergies  Home Medications:  (Not in a hospital admission)  OB/GYN Status:  No LMP recorded. Patient is postmenopausal.  General Assessment Data [facility] of Assessment: [facility] ED TTS Assessment: In system Is this a Tele or Face-to-Face Assessment?: Tele Assessment Is this an Initial Assessment or a Re-assessment for this encounter?: Initial Assessment Patient Accompanied by:: N/A Language Other than English: No Living Arrangements: Other (Comment)(Lives with her son.) What gender do you identify as?: Female Marital status: Divorced Pregnancy Status: No Living Arrangements: Children Can pt return to current living arrangement?: Yes Admission Status: Voluntary Petitioner: Other Is patient capable of signing voluntary admission?: Yes Referral Source: Self/Family/Friend(Pt took the bus to [employer].) Insurance type: MCD     Crisis Care Plan Living Arrangements: Children Name of Psychiatrist: None reported Name of Therapist: None reported  Education Status Is patient currently in school?: No Is the patient employed, unemployed or receiving disability?: Unemployed  Risk to self with the past 6 months Suicidal Ideation: Yes-Currently Present Has patient been a risk to self within the past 6 months prior to admission? : Yes Suicidal Intent:  Yes-Currently Present Has patient had any suicidal intent within the past 6 months prior to admission? : Yes Is patient at risk for suicide?: Yes Suicidal Plan?: Yes-Currently Present Has patient had any suicidal plan within the past 6 months prior to admission? : No Specify Current Suicidal Plan: Not going to dialysis or taking insuline Access to Means: Yes Specify Access to Suicidal Means: Not taking medical recommendations What has been your use of drugs/alcohol within the last 12 months?: Cocaine and ETOH Previous Attempts/Gestures: No How many times?: 0 Other Self Harm Risks: Not taking medications. Triggers for Past Attempts: None known Intentional Self Injurious Behavior: None Family Suicide History: Unknown Recent stressful life event(s): Recent negative physical changes(Dialysis) Persecutory voices/beliefs?: No Depression: Yes Depression Symptoms: Despondent, Tearfulness, Insomnia, Isolating, Loss of interest in usual pleasures, Feeling worthless/self pity Substance abuse history and/or treatment for substance abuse?: Yes Suicide prevention information given to non-admitted patients: Not applicable  Risk to Others within the past 6 months Homicidal Ideation: No Does patient have any lifetime risk  of violence toward others beyond the six months prior to admission? : No Thoughts of Harm to Others: No Current Homicidal Intent: No Current Homicidal Plan: No Access to Homicidal Means: No Identified Victim: No one History of harm to others?: No Assessment of Violence: None Noted Violent Behavior Description: None reported Does patient have access to weapons?: No Criminal Charges Pending?: No Does patient have a court date: No Is patient on probation?: No  Psychosis Hallucinations: None noted Delusions: None noted  Mental Status Report Appearance/Hygiene: Unremarkable, In scrubs Eye Contact: Poor Motor Activity: Freedom of movement, Restlessness Speech:  Logical/coherent, Argumentative Level of Consciousness: Alert, Irritable Mood: Depressed, Anxious, Despair, Helpless Affect: Anxious, Apprehensive, Depressed, Sad Anxiety Level: Panic Attacks Panic attack frequency: Every few days Most recent panic attack: Today Thought Processes: Coherent, Relevant Judgement: Impaired Orientation: Person, Place, Situation Obsessive Compulsive Thoughts/Behaviors: None  Cognitive Functioning Concentration: Decreased Memory: Recent Impaired, Remote Intact Is patient IDD: No Insight: Fair Impulse Control: Poor Appetite: Good Have you had any weight changes? : No Change Sleep: Decreased Total Hours of Sleep: (<4H/D) Vegetative Symptoms: Staying in bed, Decreased grooming  ADLScreening [facility] Assessment Services) Patient's cognitive ability adequate to safely complete daily activities?: Yes Patient able to express need for assistance with ADLs?: Yes Independently performs ADLs?: Yes (appropriate for developmental age)  Prior Inpatient Therapy Prior Inpatient Therapy: No  Prior Outpatient Therapy Prior Outpatient Therapy: No Does patient have an ACCT team?: No Does patient have Intensive In-House Services?  : No Does patient have [facility] services? : No Does patient have P4CC services?: No  ADL Screening (condition at time of admission) Patient's cognitive ability adequate to safely complete daily activities?: Yes Is the patient deaf or have difficulty hearing?: No Does the patient have difficulty seeing, even when wearing glasses/contacts?: No Does the patient have difficulty concentrating, remembering, or making decisions?: Yes Patient able to express need for assistance with ADLs?: Yes Does the patient have difficulty dressing or bathing?: No Independently performs ADLs?: Yes (appropriate for developmental age) Does the patient have difficulty walking or climbing stairs?: No Weakness of Legs: None Weakness of Arms/Hands: None        Abuse/Neglect Assessment (Assessment to be complete while patient is alone) Abuse/Neglect Assessment Can Be Completed: Yes Physical Abuse: Denies Verbal Abuse: Denies Sexual Abuse: Denies Exploitation of patient/patient's resources: Denies Self-Neglect: Denies     Regulatory affairs officer (For Healthcare) Does Patient Have a Medical Advance Directive?: (Pt says she does not have advanced directives.) Does patient want to make changes to medical advance directive?: No - Patient declined Type of Advance Directive: [place] in Chart?: No - copy requested Would patient like information on creating a medical advance directive?: No - Patient declined          Disposition:  Disposition Initial Assessment Completed for this Encounter: Yes Patient referred to: Other (Comment)(Pt to be reviewed with PA)  This service was provided via telemedicine using a 2-way, interactive audio and video technology.  Names of all persons participating in this telemedicine service and their role in this encounter. Name: Chrishelle Zito Role: patient  Name: Curlene Dolphin, M.S. LCAS QP Role: clinician  Name:  Role:   Name:  Role:     Raymondo Band 06/07/2018 11:32 PM

## 2018-06-07 NOTE — ED Triage Notes (Signed)
Staffing office notified of need for suicide sitter.

## 2018-06-08 ENCOUNTER — Other Ambulatory Visit: Payer: Self-pay

## 2018-06-08 LAB — RAPID URINE DRUG SCREEN, HOSP PERFORMED
AMPHETAMINES: NOT DETECTED
BARBITURATES: NOT DETECTED
Benzodiazepines: NOT DETECTED
COCAINE: POSITIVE — AB
Opiates: NOT DETECTED
TETRAHYDROCANNABINOL: NOT DETECTED

## 2018-06-08 LAB — CBG MONITORING, ED
Glucose-Capillary: 134 mg/dL — ABNORMAL HIGH (ref 70–99)
Glucose-Capillary: 83 mg/dL (ref 70–99)
Glucose-Capillary: 112 mg/dL — ABNORMAL HIGH (ref 70–99)

## 2018-06-08 LAB — PROTIME-INR
INR: 2.66
PROTHROMBIN TIME: 28.2 s — AB (ref 11.4–15.2)

## 2018-06-08 MED ORDER — BENZONATATE 100 MG PO CAPS
100.0000 mg | ORAL_CAPSULE | Freq: Two times a day (BID) | ORAL | Status: DC | PRN
  Administered 2018-06-08: 100 mg via ORAL
  Filled 2018-06-08: qty 1

## 2018-06-08 MED ORDER — WARFARIN - PHYSICIAN DOSING INPATIENT: Freq: Every day | Status: DC

## 2018-06-08 NOTE — ED Provider Notes (Signed)
Contacted by Psychiatry NP - patient has been cleared from psych for d/c with outpatient resources.  Patient denies SI currently but states she does not want to go live with her son and that is why she does not want to leave.  Will have her meet with SW to explore other options such as shelters.  Discussed home care, outpatient follow up and return precautions.   IVC revoked.     Quintella Reichert, MD 06/08/18 [phone]

## 2018-06-08 NOTE — ED Notes (Signed)
Tessalon given for nonprod cough.

## 2018-06-08 NOTE — Progress Notes (Signed)
Patient is seen by me via tele-psych today and have consulted with Dr. Dwyane Dee.  Patient denies any suicidal or homicidal ideations and denies any hallucinations.  Patient seems agitated to be asked the same questions again that she had just recently saw the CSW.  Patient has been noncompliant with her treatments outside of the hospital.  She denies any intention of wanting to commit suicide.  Patient does not meet inpatient criteria and is psychiatrically cleared.  I have contacted Dr. Ralene Bathe and notified her and her recommendations.

## 2018-06-08 NOTE — ED Provider Notes (Signed)
I assumed care of the patient at 0 700.  I received a call from nursing that the patient was having a cough and she was concerned about her chest x-ray read.  Independently evaluated the imaged in the read.  Patient has recurrent right basilar opacity.  This was seen on the last for 5 x-rays.  I am not sure that this represents pneumonia.  She is afebrile without leukocytosis.  We will give some cough medicine.   Deno Etienne, DO 06/08/18 1603

## 2018-06-08 NOTE — BH Assessment (Signed)
[place] Assessment Progress Note  Pt reassessed today. Pt denies SI, HI, AVH. Pt reports that she was never suicidal, she just wanted to die. Pt denies every having any plan or intent of suicide.  Pt reports that she wants "some counseling or something".  Pt feels that she can be d/c with referrals for counseling.   Kenna Gilbert. Lovena Le, [place], [place], LPCA Counselor

## 2018-06-08 NOTE — ED Notes (Signed)
Dr Tyrone Nine to review pt's chest x-ray. Pt woke - ate graham crackers then returned to sleeping.

## 2018-06-08 NOTE — ED Notes (Addendum)
ALL belongings - 2 labeled belongings bags and 1 valuables envelope - returned to pt - Pt signed verifying all items present. Bus pass given as requested.

## 2018-06-08 NOTE — Progress Notes (Addendum)
CSW met with pt to discuss housing.  Pt upset about discharge and saying that no one will help her.  CSW asked her about housing options and pt refused to discuss this and said for CSW to "not worry about it."  CSW asked about transportation and pt again would not discuss.  Flag in chart states pt has legal guardian but CSW not able to find any guardianship paperwork in the chart or other documentation regarding this.  CSW later asked pt if she has legal guardian and she said she did not.  Bus pass provided to RN if pt needs this. Rhonda Lynch, MSW, LCSW Clinical Social Worker 06/08/2018 3:40 PM   CSW contacted pt son, Rhonda Lynch, who told CSW that pt "does not like me" and that his brother, Rhonda Lynch is the one who is currently "dealig" wit her.  CSW inquired about legal guardianship and Rhonda Lynch reported that he had taken out IVC paperwork on her before but had never been declared her legal guardian.  CSW attempted to call son Rhonda Lynch but message at that number said that he "is not accepting calls" and the call would not go through. Rhonda Lynch, MSW, LCSW Clinical Social Worker 06/08/2018 3:52 PM

## 2018-06-08 NOTE — ED Notes (Signed)
Marya Amsler, SW, aware of pt's request w/housing d/t she does not feel like returning to her son's home.

## 2018-06-08 NOTE — ED Notes (Signed)
IVC papers rescinded by Dr Ralene Bathe - copy faxed to [facility], copy sent to Medical Records, and original placed in folder for [employer].

## 2018-06-18 ENCOUNTER — Ambulatory Visit (INDEPENDENT_AMBULATORY_CARE_PROVIDER_SITE_OTHER): Payer: Self-pay | Admitting: Physician Assistant

## 2018-06-20 ENCOUNTER — Emergency Department (HOSPITAL_COMMUNITY): Payer: Medicare Other

## 2018-06-20 ENCOUNTER — Other Ambulatory Visit: Payer: Self-pay

## 2018-06-20 ENCOUNTER — Inpatient Hospital Stay (HOSPITAL_COMMUNITY)
Admission: EM | Admit: 2018-06-20 | Discharge: 2018-06-24 | DRG: 291 | Disposition: A | Discharge status: Home / Self Care | Payer: Medicare Other | Attending: Internal Medicine | Admitting: Internal Medicine

## 2018-06-20 ENCOUNTER — Encounter (HOSPITAL_COMMUNITY): Payer: Self-pay

## 2018-06-20 DIAGNOSIS — J189 Pneumonia, unspecified organism: Secondary | ICD-10-CM | POA: Diagnosis present

## 2018-06-20 DIAGNOSIS — Z79899 Other long term (current) drug therapy: Secondary | ICD-10-CM

## 2018-06-20 DIAGNOSIS — Z7901 Long term (current) use of anticoagulants: Secondary | ICD-10-CM | POA: Diagnosis not present

## 2018-06-20 DIAGNOSIS — D631 Anemia in chronic kidney disease: Secondary | ICD-10-CM | POA: Diagnosis present

## 2018-06-20 DIAGNOSIS — Y95 Nosocomial condition: Secondary | ICD-10-CM | POA: Diagnosis present

## 2018-06-20 DIAGNOSIS — I248 Other forms of acute ischemic heart disease: Secondary | ICD-10-CM | POA: Diagnosis present

## 2018-06-20 DIAGNOSIS — E1165 Type 2 diabetes mellitus with hyperglycemia: Secondary | ICD-10-CM | POA: Diagnosis present

## 2018-06-20 DIAGNOSIS — IMO0001 Reserved for inherently not codable concepts without codable children: Secondary | ICD-10-CM | POA: Diagnosis present

## 2018-06-20 DIAGNOSIS — R0602 Shortness of breath: Secondary | ICD-10-CM

## 2018-06-20 DIAGNOSIS — Z9049 Acquired absence of other specified parts of digestive tract: Secondary | ICD-10-CM | POA: Diagnosis not present

## 2018-06-20 DIAGNOSIS — Z992 Dependence on renal dialysis: Secondary | ICD-10-CM

## 2018-06-20 DIAGNOSIS — I160 Hypertensive urgency: Secondary | ICD-10-CM | POA: Diagnosis present

## 2018-06-20 DIAGNOSIS — G9341 Metabolic encephalopathy: Secondary | ICD-10-CM | POA: Diagnosis present

## 2018-06-20 DIAGNOSIS — J9601 Acute respiratory failure with hypoxia: Secondary | ICD-10-CM | POA: Diagnosis present

## 2018-06-20 DIAGNOSIS — I5033 Acute on chronic diastolic (congestive) heart failure: Secondary | ICD-10-CM | POA: Diagnosis present

## 2018-06-20 DIAGNOSIS — A419 Sepsis, unspecified organism: Secondary | ICD-10-CM

## 2018-06-20 DIAGNOSIS — Z9981 Dependence on supplemental oxygen: Secondary | ICD-10-CM

## 2018-06-20 DIAGNOSIS — Z90710 Acquired absence of both cervix and uterus: Secondary | ICD-10-CM | POA: Diagnosis not present

## 2018-06-20 DIAGNOSIS — I132 Hypertensive heart and chronic kidney disease with heart failure and with stage 5 chronic kidney disease, or end stage renal disease: Secondary | ICD-10-CM | POA: Diagnosis present

## 2018-06-20 DIAGNOSIS — N186 End stage renal disease: Secondary | ICD-10-CM | POA: Diagnosis present

## 2018-06-20 DIAGNOSIS — Z86711 Personal history of pulmonary embolism: Secondary | ICD-10-CM | POA: Diagnosis not present

## 2018-06-20 DIAGNOSIS — Z87891 Personal history of nicotine dependence: Secondary | ICD-10-CM

## 2018-06-20 DIAGNOSIS — E119 Type 2 diabetes mellitus without complications: Secondary | ICD-10-CM

## 2018-06-20 DIAGNOSIS — I776 Arteritis, unspecified: Secondary | ICD-10-CM | POA: Diagnosis present

## 2018-06-20 DIAGNOSIS — E1122 Type 2 diabetes mellitus with diabetic chronic kidney disease: Secondary | ICD-10-CM | POA: Diagnosis present

## 2018-06-20 DIAGNOSIS — Z794 Long term (current) use of insulin: Secondary | ICD-10-CM | POA: Diagnosis not present

## 2018-06-20 DIAGNOSIS — J9600 Acute respiratory failure, unspecified whether with hypoxia or hypercapnia: Secondary | ICD-10-CM

## 2018-06-20 DIAGNOSIS — G92 Toxic encephalopathy: Secondary | ICD-10-CM | POA: Diagnosis present

## 2018-06-20 DIAGNOSIS — E1121 Type 2 diabetes mellitus with diabetic nephropathy: Secondary | ICD-10-CM | POA: Diagnosis not present

## 2018-06-20 HISTORY — DX: Type 2 diabetes mellitus without complications: E11.9

## 2018-06-20 LAB — CBC WITH DIFFERENTIAL/PLATELET
ABS IMMATURE GRANULOCYTES: 0.05 10*3/uL (ref 0.00–0.07)
Basophils Absolute: 0 10*3/uL (ref 0.0–0.1)
Basophils Relative: 1 %
EOS PCT: 1 %
Eosinophils Absolute: 0.1 10*3/uL (ref 0.0–0.5)
HEMATOCRIT: 34.7 % — AB (ref 36.0–46.0)
HEMOGLOBIN: 9.8 g/dL — AB (ref 12.0–15.0)
Immature Granulocytes: 1 %
LYMPHS ABS: 1.7 10*3/uL (ref 0.7–4.0)
LYMPHS PCT: 25 %
MCH: 21.9 pg — AB (ref 26.0–34.0)
MCHC: 28.2 g/dL — AB (ref 30.0–36.0)
MCV: 77.5 fL — ABNORMAL LOW (ref 80.0–100.0)
MONO ABS: 1 10*3/uL (ref 0.1–1.0)
MONOS PCT: 15 %
Neutro Abs: 3.8 10*3/uL (ref 1.7–7.7)
Neutrophils Relative %: 57 %
Platelets: 204 10*3/uL (ref 150–400)
RBC: 4.48 MIL/uL (ref 3.87–5.11)
RDW: 19 % — ABNORMAL HIGH (ref 11.5–15.5)
WBC: 6.6 10*3/uL (ref 4.0–10.5)
nRBC: 0 % (ref 0.0–0.2)

## 2018-06-20 LAB — I-STAT VENOUS BLOOD GAS, ED
ACID-BASE DEFICIT: 4 mmol/L — AB (ref 0.0–2.0)
BICARBONATE: 22.7 mmol/L (ref 20.0–28.0)
O2 Saturation: 88 %
PH VEN: 7.282 (ref 7.250–7.430)
PO2 VEN: 62 mmHg — AB (ref 32.0–45.0)
TCO2: 24 mmol/L (ref 22–32)
pCO2, Ven: 48.2 mmHg (ref 44.0–60.0)

## 2018-06-20 LAB — COMPREHENSIVE METABOLIC PANEL
ALBUMIN: 2.3 g/dL — AB (ref 3.5–5.0)
ALT: 16 U/L (ref 0–44)
AST: 32 U/L (ref 15–41)
Alkaline Phosphatase: 76 U/L (ref 38–126)
Anion gap: 8 (ref 5–15)
BUN: 38 mg/dL — AB (ref 8–23)
CHLORIDE: 112 mmol/L — AB (ref 98–111)
CO2: 20 mmol/L — AB (ref 22–32)
CREATININE: 2.84 mg/dL — AB (ref 0.44–1.00)
Calcium: 8.3 mg/dL — ABNORMAL LOW (ref 8.9–10.3)
GFR calc Af Amer: 19 mL/min — ABNORMAL LOW (ref >60)
GFR, EST NON AFRICAN AMERICAN: 16 mL/min — AB (ref >60)
GLUCOSE: 87 mg/dL (ref 70–99)
Potassium: 3.8 mmol/L (ref 3.5–5.1)
SODIUM: 140 mmol/L (ref 135–145)
Total Bilirubin: 0.3 mg/dL (ref 0.3–1.2)
Total Protein: 6 g/dL — ABNORMAL LOW (ref 6.5–8.1)

## 2018-06-20 LAB — CBG MONITORING, ED
GLUCOSE-CAPILLARY: 96 mg/dL (ref 70–99)
GLUCOSE-CAPILLARY: 76 mg/dL (ref 70–99)

## 2018-06-20 LAB — BRAIN NATRIURETIC PEPTIDE

## 2018-06-20 LAB — PROTIME-INR
INR: 1.76
Prothrombin Time: 20.3 seconds — ABNORMAL HIGH (ref 11.4–15.2)

## 2018-06-20 LAB — TROPONIN I: TROPONIN I: 0.05 ng/mL — AB (ref <0.03)

## 2018-06-20 LAB — I-STAT CG4 LACTIC ACID, ED: Lactic Acid, Venous: 1.3 mmol/L (ref 0.5–1.9)

## 2018-06-20 MED ORDER — VANCOMYCIN HCL IN DEXTROSE 1-5 GM/200ML-% IV SOLN
1000.0000 mg | Freq: Once | INTRAVENOUS | Status: AC
  Administered 2018-06-20: 1000 mg via INTRAVENOUS
  Filled 2018-06-20: qty 200

## 2018-06-20 MED ORDER — LACTATED RINGERS IV SOLN
INTRAVENOUS | Status: DC
  Administered 2018-06-20: 23:00:00 via INTRAVENOUS

## 2018-06-20 MED ORDER — SODIUM CHLORIDE 0.9 % IV SOLN
2.0000 g | Freq: Once | INTRAVENOUS | Status: AC
  Administered 2018-06-20: 2 g via INTRAVENOUS
  Filled 2018-06-20: qty 2

## 2018-06-20 MED ORDER — NITROGLYCERIN 0.4 MG SL SUBL: 0.4000 mg | SUBLINGUAL_TABLET | Freq: Once | SUBLINGUAL | Status: DC

## 2018-06-20 MED ORDER — MORPHINE SULFATE (PF) 2 MG/ML IV SOLN
1.0000 mg | Freq: Once | INTRAVENOUS | Status: AC
  Administered 2018-06-20: 1 mg via INTRAVENOUS
  Filled 2018-06-20: qty 1

## 2018-06-20 MED ORDER — ACETAMINOPHEN 325 MG PO TABS: 650.0000 mg | ORAL_TABLET | Freq: Once | ORAL | Status: DC

## 2018-06-20 MED ORDER — SODIUM CHLORIDE 0.9 % IV SOLN
2.0000 g | INTRAVENOUS | Status: DC
  Filled 2018-06-20: qty 2

## 2018-06-20 MED ORDER — VANCOMYCIN HCL IN DEXTROSE 750-5 MG/150ML-% IV SOLN
750.0000 mg | INTRAVENOUS | Status: DC
  Administered 2018-06-21: 750 mg via INTRAVENOUS
  Filled 2018-06-20: qty 150

## 2018-06-20 NOTE — Progress Notes (Signed)
Pharmacy Antibiotic Note  Rhonda Lynch is a 62 y.o. female admitted on 06/20/2018 with sepsis.  Pharmacy has been consulted for vancomycin and cefepime dosing. Tmax is 100.4 and WBC is WNL. Pt with history of ESRD on HD.   Plan: Vancomycin 1gm IV x 1 then 750mg  post-HD Cefepime 2gm IV x 1 then 2gm post-HD F/u renal plans, C&S, clinical status and pre-HD vanc level when appropriate  Height: 5\' 1"  (154.9 cm) Weight: 144 lb (65.3 kg) IBW/kg (Calculated) : 47.8  Temp (24hrs), Avg:99.7 F (37.6 C), Min:99 F (37.2 C), Max:100.4 F (38 C)  Recent Labs  Lab 06/20/18 2042  WBC 6.6    Estimated Creatinine Clearance: 11.5 mL/min (A) (by C-G formula based on SCr of 4.26 mg/dL (H)).    No Known Allergies  Antimicrobials this admission: Vanc 10/11>> Cefepime 10/11>>  Dose adjustments this admission: N/A  Microbiology results: Pending  Thank you for allowing pharmacy to be a part of this patient's care.  Lara Palinkas, Rande Lawman 06/20/2018 9:58 PM

## 2018-06-20 NOTE — ED Triage Notes (Signed)
Patient BIB [facility] for headache and SOB. Patient reports increasing headache x 3 weeks and SOB for 3 days. Dialysis patient T, TH, S and she missed yesterdays visit. CBG 66 EMS gave 15g oral glucose, CBG 77 upon arrival. Denies chest pain, abdominal pain, nausea, or vomiting. Patient on home O2 3L nasal cannula.   BP 184/156 HR 83 100% 3L

## 2018-06-20 NOTE — Progress Notes (Signed)
Pt transported from ED C24 to 2W09 while on BIPAP without incidence.

## 2018-06-20 NOTE — ED Notes (Signed)
Spoke with MD about need for fluid bolus due to code sepsis, states at this time he does not want to fluid overload and will start LR 138ml/hr.

## 2018-06-20 NOTE — ED Provider Notes (Signed)
[facility] EMERGENCY DEPARTMENT Provider Note   CSN: [id number] Arrival date & time: 06/20/18  2019     History   Chief Complaint Chief Complaint  Patient presents with  . Shortness of Breath    HPI TEMIA DEBROUX is a 62 y.o. female.  The history is provided by the patient.  Shortness of Breath  This is a recurrent problem. The problem occurs continuously.The current episode started 12 to 24 hours ago. The problem has not changed since onset.Associated symptoms include a fever, headaches, cough, sputum production and orthopnea. Pertinent negatives include no coryza, no rhinorrhea, no sore throat, no swollen glands, no ear pain, no neck pain, no wheezing, no chest pain, no vomiting, no abdominal pain, no rash and no leg pain. It is unknown what precipitated the problem. Risk factors: ESRD. She has tried nothing for the symptoms. The treatment provided no relief. She has had prior hospitalizations. Associated medical issues include PE. Associated medical issues comments: ESRD on HD, missed diaylsis recently .    Past Medical History:  Diagnosis Date  . Chronic kidney disease   . Diabetes mellitus without complication ([place])   . ESRD (end stage renal disease) ([place])   . Hypertension   . Oxygen deficiency    2 L at night  . Substance abuse ([place])    has been clean for 4 months     Patient Active Problem List   Diagnosis Date Noted  . HCAP (healthcare-associated pneumonia) 06/21/2018  . Substance induced mood disorder ([place]) 05/15/2018  . Agitation   . Hemodialysis catheter infection, initial encounter ([place])   . 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 Pulmonary embolism ([place]) 04/27/2018  . Sepsis ([place]) 04/26/2018  . Dyspnea 04/26/2018  . Chest pain 04/26/2018  . Tachycardia 04/26/2018  . Abdominal pain 04/18/2018  . DM (diabetes mellitus), type 2 with renal complications ([place]) 50/53/9767  . Right flank pain 04/18/2018  . Acute respiratory failure with hypoxia ([place])  04/12/2018  . SVT (supraventricular tachycardia) ([place])   . Chronic renal failure   . Diabetes ([place]) 04/02/2018  . Hypertension   . Noncompliance of patient with renal dialysis ([place])   . Acute pulmonary edema ([facility])   . Elevated troponin 03/19/2018  . AKI (acute kidney injury) ([place])   . Fever   . 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 Diffuse pulmonary alveolar hemorrhage   . Hypoxemia   . Pulmonary infiltrate   . 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)   . ESRD (end stage renal disease) ([place]) 02/12/2018  . Acute respiratory failure ([place]) 02/12/2018  . Alcohol abuse 02/12/2018  . Anemia 02/12/2018  . Cocaine abuse ([place]) 02/12/2018  . Epigastric pain 02/12/2018  . Pneumonia 02/12/2018  . TOBACCO ABUSE 03/08/2010  . PNEUMONIA 03/08/2010  . UTI 03/08/2010  . ELEVATED BLOOD PRESSURE WITHOUT DIAGNOSIS OF HYPERTENSION 03/08/2010    Past Surgical History:  Procedure Laterality Date  . AV FISTULA PLACEMENT Left 03/05/2018   Procedure: ARTERIOVENOUS (AV) FISTULA CREATION  LEFT UPPER EXTREMITY;  Surgeon: [name], MD;  Location: [facility];  Service: Vascular;  Laterality: Left;  . HEMATOMA EVACUATION Left 03/06/2018   Procedure: EVACUATION HEMATOMA LEFT ARM;  Surgeon: Angelia Mould, MD;  Location: [facility];  Service: Vascular;  Laterality: Left;  . INSERTION OF DIALYSIS CATHETER Left 04/29/2018   Procedure: INSERTION OF DIALYSIS CATHETER LEFT INTERNAL JUGULAR;  Surgeon: Angelia Mould, MD;  Location: [facility];  Service: Vascular;  Laterality: Left;  . IR FLUORO  GUIDE CV LINE RIGHT  02/18/2018  . IR FLUORO GUIDE CV LINE RIGHT  02/26/2018  . IR REMOVAL TUN CV CATH W/O FL  04/27/2018  . IR US GUIDE VASC ACCESS RIGHT  02/18/2018  . TEE WITHOUT CARDIOVERSION N/A 04/29/2018   Procedure: TRANSESOPHAGEAL ECHOCARDIOGRAM (TEE);  Surgeon: Sueanne Margarita, MD;  Location: [facility] ENDOSCOPY;  Service: Cardiovascular;  Laterality: N/A;     OB History   None      Home  Medications    Prior to Admission medications   Medication Sig Start Date End Date Taking? Authorizing Provider  acetaminophen (TYLENOL) 325 MG tablet Take 2 tablets (650 mg total) by mouth every 6 (six) hours as needed for mild pain (or Fever >/= 101). 04/24/18   Ghimire, Henreitta Leber, MD  Blood Glucose Monitoring Suppl (ACCU-CHEK AVIVA) device Use as instructed 06/05/18 06/05/19  Clent Demark, PA-C  calcitRIOL (ROCALTROL) 0.25 MCG capsule Take 1 capsule (0.25 mcg total) by mouth every other day. 04/14/18   Nita Sells, MD  feeding supplement, GLUCERNA SHAKE, (GLUCERNA SHAKE) LIQD Take 237 mLs by mouth 2 (two) times daily between meals. Patient not taking: Reported on 04/26/2018 04/24/18   Jonetta Osgood, MD  fluconazole (DIFLUCAN) 200 MG tablet Take 1 tablet (200 mg total) by mouth daily. Patient not taking: Reported on 06/08/2018 05/02/18   Shelly Coss, MD  glucose blood (ACCU-CHEK AVIVA) test strip Use twice per day. 06/05/18   Clent Demark, PA-C  insulin aspart (NOVOLOG) 100 UNIT/ML injection Inject 0-9 Units into the skin 3 (three) times daily with meals. 04/13/18   Nita Sells, MD  insulin glargine (LANTUS) 100 UNIT/ML injection Inject 0.05 mLs (5 Units total) into the skin at bedtime. 04/13/18   Nita Sells, MD  Lancets (ACCU-CHEK SOFT TOUCH) lancets Use twice per day. 06/05/18   Clent Demark, PA-C  metoprolol tartrate (LOPRESSOR) 25 MG tablet Take 1 tablet (25 mg total) by mouth 2 (two) times daily. 05/02/18   Shelly Coss, MD  multivitamin (RENA-VIT) TABS tablet Take 1 tablet by mouth at bedtime. Patient not taking: Reported on 06/08/2018 04/13/18   Nita Sells, MD  ondansetron (ZOFRAN) 4 MG tablet Take 1 tablet (4 mg total) by mouth every 6 (six) hours as needed for nausea. 04/13/18   Nita Sells, MD  warfarin (COUMADIN) 2.5 MG tablet Take 1 tablet (2.5 mg total) by mouth daily at 6 PM. 05/03/18   Shelly Coss, MD  zolpidem (AMBIEN) 5  MG tablet Take 1 tablet (5 mg total) by mouth at bedtime as needed for sleep. 04/13/18   Nita Sells, MD    Family History Family History  Problem Relation Age of Onset  . Autoimmune disease Neg Hx     Social History Social History   Tobacco Use  . Smoking status: Former Smoker    Packs/day: 0.50    Last attempt to quit: 04/08/2018    Years since quitting: 0.2  . Smokeless tobacco: Never Used  . Tobacco comment: trying to quit 04/02/2018  Substance Use Topics  . Alcohol use: Yes  . Drug use: Yes    Types: Cocaine, Heroin    Comment: See notes     Allergies   Patient has no known allergies.   Review of Systems Review of Systems  Constitutional: Positive for fever. Negative for chills.  HENT: Negative for ear pain, rhinorrhea and sore throat.   Eyes: Negative for pain and visual disturbance.  Respiratory: Positive for cough, sputum production and shortness  of breath. Negative for wheezing.   Cardiovascular: Positive for orthopnea. Negative for chest pain and palpitations.  Gastrointestinal: Negative for abdominal pain and vomiting.  Genitourinary: Negative for dysuria and hematuria.  Musculoskeletal: Negative for arthralgias, back pain and neck pain.  Skin: Negative for color change and rash.  Neurological: Positive for headaches. Negative for seizures and syncope.  All other systems reviewed and are negative.    Physical Exam Updated Vital Signs  ED Triage Vitals  Enc Vitals Group     BP 06/20/18 2024 (!) 187/133     Pulse Rate 06/20/18 2024 95     Resp 06/20/18 2024 16     Temp 06/20/18 2024 99 F (37.2 C)     Temp Source 06/20/18 2024 Oral     SpO2 06/20/18 2024 96 %     Weight 06/20/18 2029 144 lb (65.3 kg)     Height 06/20/18 2029 5\' 1"  (1.549 m)     Head Circumference --      Peak Flow --      Pain Score 06/20/18 2027 10     Pain Loc --      Pain Edu? --      Excl. in [place]? --     Physical Exam  Constitutional: She is oriented to person,  place, and time. She appears well-developed. She appears distressed.  HENT:  Head: Normocephalic and atraumatic.  Mouth/Throat: No posterior oropharyngeal edema.  Eyes: Pupils are equal, round, and reactive to light. Conjunctivae and EOM are normal.  Neck: Normal range of motion. Neck supple.  Cardiovascular: Normal rate and regular rhythm.  No murmur heard. Pulmonary/Chest: Tachypnea noted. She is in respiratory distress. She has decreased breath sounds. She has rales.  Abdominal: Soft. Bowel sounds are normal. There is no tenderness.  Musculoskeletal: Normal range of motion.       Right lower leg: She exhibits edema (trace).       Left lower leg: She exhibits edema (trace).  Normal rom of cervical spine without any pain   Neurological: She is alert and oriented to person, place, and time. She is not disoriented. No cranial nerve deficit.  5+/5 strength, normal sensation  Skin: Skin is warm and dry. Capillary refill takes less than 2 seconds.  Psychiatric: She has a normal mood and affect.  Nursing note and vitals reviewed.    ED Treatments / Results  Labs (all labs ordered are listed, but only abnormal results are displayed) Labs Reviewed  COMPREHENSIVE METABOLIC PANEL - Abnormal; Notable for the following components:      Result Value   Chloride 112 (*)    CO2 20 (*)    BUN 38 (*)    Creatinine, Ser 2.84 (*)    Calcium 8.3 (*)    Total Protein 6.0 (*)    Albumin 2.3 (*)    GFR calc non Af Amer 16 (*)    GFR calc Af Amer 19 (*)    All other components within normal limits  CBC WITH DIFFERENTIAL/PLATELET - Abnormal; Notable for the following components:   Hemoglobin 9.8 (*)    HCT 34.7 (*)    MCV 77.5 (*)    MCH 21.9 (*)    MCHC 28.2 (*)    RDW 19.0 (*)    All other components within normal limits  TROPONIN I - Abnormal; Notable for the following components:   Troponin I 0.05 (*)    All other components within normal limits  BRAIN NATRIURETIC PEPTIDE - Abnormal;  Notable for the following components:   B Natriuretic Peptide >4,500.0 (*)    All other components within normal limits  PROTIME-INR - Abnormal; Notable for the following components:   Prothrombin Time 20.3 (*)    All other components within normal limits  TROPONIN I - Abnormal; Notable for the following components:   Troponin I 0.07 (*)    All other components within normal limits  BLOOD GAS, ARTERIAL - Abnormal; Notable for the following components:   pO2, Arterial 130 (*)    All other components within normal limits  I-STAT VENOUS BLOOD GAS, ED - Abnormal; Notable for the following components:   pO2, Ven 62.0 (*)    Acid-base deficit 4.0 (*)    All other components within normal limits  MRSA PCR SCREENING  CULTURE, BLOOD (ROUTINE X 2)  CULTURE, BLOOD (ROUTINE X 2)  GLUCOSE, CAPILLARY  URINALYSIS, ROUTINE W REFLEX MICROSCOPIC  BASIC METABOLIC PANEL  CBC  TROPONIN I  TROPONIN I  PROTIME-INR  HEPATITIS B SURFACE ANTIGEN  HEPATITIS B CORE ANTIBODY, TOTAL  HEPATITIS B SURFACE ANTIBODY,QUALITATIVE  CBG MONITORING, ED  CBG MONITORING, ED  I-STAT CG4 LACTIC ACID, ED    EKG None  Radiology Ct Abdomen Pelvis Wo Contrast  Result Date: 06/21/2018 CLINICAL DATA:  62 year old female with history of severe headache. Sepsis. EXAM: CT ABDOMEN AND PELVIS WITHOUT CONTRAST TECHNIQUE: Multidetector CT imaging of the abdomen and pelvis was performed following the standard protocol without IV contrast. COMPARISON:  CT the abdomen and pelvis 04/19/2018. FINDINGS: Lower chest: Cardiomegaly. Small bilateral pleural effusions lying dependently. Patchy multifocal ground-glass attenuation and septal thickening throughout the lung bases bilaterally. Hepatobiliary: No definite cystic or solid hepatic lesions are confidently identified on today's noncontrast CT examination. Status post cholecystectomy. Pancreas: No definite pancreatic mass or peripancreatic fluid or inflammatory changes are noted on  today's noncontrast CT examination. Spleen: Unremarkable. Adrenals/Urinary Tract: Unenhanced appearance of the kidneys and bilateral adrenal glands is normal. No hydroureteronephrosis. Urinary bladder is normal in appearance. Stomach/Bowel: Unenhanced appearance of the stomach is normal. No pathologic dilatation of small bowel or colon. Normal appendix. Vascular/Lymphatic: Aortic atherosclerosis. No lymphadenopathy noted in the abdomen or pelvis. Reproductive: Status post hysterectomy. Right ovary is not confidently identified may be surgically absent or atrophic. 1.8 cm fatty attenuation lesion in the left adnexa, stable compared to numerous prior examinations, most compatible with a small dermoid. Other: No significant volume of ascites.  No pneumoperitoneum. Musculoskeletal: There are no aggressive appearing lytic or blastic lesions noted in the visualized portions of the skeleton. IMPRESSION: 1. No acute findings are noted in the abdomen or pelvis to account for the patient's symptoms. 2. Findings in the lung bases concerning for congestive heart failure, as above. 3. Aortic atherosclerosis. 4. Additional incidental findings, as above. Electronically Signed   By: Vinnie Langton M.D.   On: 06/21/2018 02:58   Ct Head Wo Contrast  Result Date: 06/21/2018 CLINICAL DATA:  Thunderclap headache. EXAM: CT HEAD WITHOUT CONTRAST TECHNIQUE: Contiguous axial images were obtained from the base of the skull through the vertex without intravenous contrast. COMPARISON:  None. FINDINGS: Brain: There is no mass, hemorrhage or extra-axial collection. The size and configuration of the ventricles and extra-axial CSF spaces are normal. There is no acute or chronic infarction. The brain parenchyma is normal. Vascular: No abnormal hyperdensity of the major intracranial arteries or dural venous sinuses. No intracranial atherosclerosis. Skull: The visualized skull base, calvarium and extracranial soft tissues are normal.  Sinuses/Orbits: Mild left maxillary sinus  mucosal thickening. No mastoid or middle ear effusion. The orbits are normal. IMPRESSION: Normal brain. Electronically Signed   By: Ulyses Jarred M.D.   On: 06/21/2018 02:32   Dg Chest Port 1 View  Result Date: 06/20/2018 CLINICAL DATA:  Initial evaluation for acute shortness of breath, cough. EXAM: PORTABLE CHEST 1 VIEW COMPARISON:  Prior radiograph from 06/07/2018. FINDINGS: Right-sided dual lumen hemodialysis catheter in place with tips overlying the cavoatrial junction. Moderate cardiomegaly, stable. Mediastinal silhouette within normal limits. Aortic atherosclerosis. Lungs mildly hypoinflated. Mild diffuse pulmonary vascular and interstitial congestion. Pat superimposed patchy right lower lobe opacity suspicious for possible infiltrate. Associated small moderate right pleural effusion with subpulmonic component. No pneumothorax. No acute osseous abnormality. IMPRESSION: 1. Patchy right lower lobe opacity, concerning for pneumonia given provided history of cough. This has worsened from 06/07/2018. 2. Associated small to moderate right pleural effusion. 3. Cardiomegaly with underlying mild diffuse pulmonary interstitial congestion/edema. 4. Aortic atherosclerosis. Electronically Signed   By: Jeannine Boga M.D.   On: 06/20/2018 22:00    Procedures .Critical Care Performed by: Lennice Sites, DO Authorized by: Lennice Sites, DO   Critical care provider statement:    Critical care time (minutes):  45   Critical care time was exclusive of:  Separately billable procedures and treating other patients and teaching time   Critical care was necessary to treat or prevent imminent or life-threatening deterioration of the following conditions:  Respiratory failure and sepsis   Critical care was time spent personally by me on the following activities:  Development of treatment plan with patient or surrogate, discussions with primary provider, evaluation of  patient's response to treatment, examination of patient, ordering and performing treatments and interventions, ordering and review of laboratory studies, ordering and review of radiographic studies, pulse oximetry, re-evaluation of patient's condition and review of old charts   I assumed direction of critical care for this patient from another provider in my specialty: no     (including critical care time)  Medications Ordered in ED Medications  acetaminophen (TYLENOL) tablet 650 mg (650 mg Oral Not Given 06/20/18 2206)  vancomycin (VANCOCIN) IVPB 750 mg/150 ml premix (has no administration in time range)  ceFEPIme (MAXIPIME) 2 g in sodium chloride 0.9 % 100 mL IVPB (has no administration in time range)  metoprolol tartrate (LOPRESSOR) tablet 25 mg (25 mg Oral Not Given 06/21/18 0808)  zolpidem (AMBIEN) tablet 5 mg (has no administration in time range)  calcitRIOL (ROCALTROL) capsule 0.25 mcg (has no administration in time range)  acetaminophen (TYLENOL) tablet 650 mg (has no administration in time range)    Or  acetaminophen (TYLENOL) suppository 650 mg (has no administration in time range)  ondansetron (ZOFRAN) tablet 4 mg (has no administration in time range)    Or  ondansetron (ZOFRAN) injection 4 mg (has no administration in time range)  insulin aspart (novoLOG) injection 0-9 Units (0 Units Subcutaneous Not Given 06/21/18 0808)  Warfarin - Pharmacist Dosing Inpatient (has no administration in time range)  warfarin (COUMADIN) tablet 2.5 mg (0 mg Oral Hold 06/21/18 0130)  Chlorhexidine Gluconate Cloth 2 % PADS 6 each (6 each Topical Not Given 06/21/18 0808)  hydrALAZINE (APRESOLINE) injection 10 mg (has no administration in time range)  ceFEPIme (MAXIPIME) 2 g in sodium chloride 0.9 % 100 mL IVPB (0 g Intravenous Stopped 06/20/18 2310)  vancomycin (VANCOCIN) IVPB 1000 mg/200 mL premix (0 mg Intravenous Stopped 06/21/18 0200)  morphine 2 MG/ML injection 1 mg (1 mg Intravenous Given  06/20/18 2327)  Initial Impression / Assessment and Plan / ED Course  I have reviewed the triage vital signs and the nursing notes.  Pertinent labs & imaging results that were available during my care of the patient were reviewed by me and considered in my medical decision making (see chart for details).     KAYTE BORCHARD is a 62 year old female with history of CKD on hemodialysis, hypertension, diabetes, PE on anticoagulation who presents to the ED with shortness of breath, body aches, fever, cough.  Patient with fever and tachycardia upon arrival.  Otherwise vitals unremarkable.  Patient hyperventilating and with signs of respiratory distress upon my evaluation.  States that she missed dialysis.  Was placed on BiPAP for respiratory support.  Has some rales on exam, some trace peripheral edema.  Has fever.  States that she has had cough and some sputum production.  Concern for sepsis and blood cultures, urine studies, chest x-ray was obtained.  Code sepsis initiated and patient empirically given IV cefepime and IV vancomycin.  Chest x-ray shows possible pneumonia, no signs of obvious/pulmonary edema.  Blood pressure and respiratory symptoms improved while on BiPAP.  Blood gas was unremarkable.  Lactic acid within normal limits.  No significant leukocytosis.  No significant electrolyte abnormality.  Kidney function at baseline.  BNP and troponin elevated likely from volume overload due to missed dialysis.  Patient improved while on BiPAP.  IV antibiotics given.  Patient with subtherapeutic INR although less likely PE given work-up thus far.  Patient with no signs to suggest meningitis.  Suspect pulmonary source for infection.  Possibly also viral. Denies chest pain. Patient admitted to medicine service for further sepsis work-up and likely will need dialysis.  Hemodynamically stable throughout my care.   This chart was dictated using voice recognition software.  Despite best efforts to proofread,   errors can occur which can change the documentation meaning.   Final Clinical Impressions(s) / ED Diagnoses   Final diagnoses:  Sepsis, due to unspecified organism, unspecified whether acute organ dysfunction present [facility])  Acute respiratory failure, unspecified whether with hypoxia or hypercapnia [facility])    ED Discharge Orders    None       Lennice Sites, DO 06/21/18 [id number]

## 2018-06-20 NOTE — ED Notes (Signed)
MD made aware of BP

## 2018-06-20 NOTE — ED Notes (Signed)
Patient taken off BiPAP per admitting orders.

## 2018-06-20 NOTE — ED Notes (Addendum)
Patient requesting to take BiPAP off now, states "I can breath just fine now. I don't need this anymore".  Informed patient we have to leave it on at this time.

## 2018-06-20 NOTE — ED Notes (Signed)
Unable to obtain second set of blood cultures due to available sites. Dialysis patient.

## 2018-06-21 ENCOUNTER — Inpatient Hospital Stay (HOSPITAL_COMMUNITY): Payer: Medicare Other

## 2018-06-21 ENCOUNTER — Other Ambulatory Visit: Payer: Self-pay

## 2018-06-21 ENCOUNTER — Encounter (HOSPITAL_COMMUNITY): Payer: Self-pay | Admitting: Internal Medicine

## 2018-06-21 DIAGNOSIS — N186 End stage renal disease: Secondary | ICD-10-CM

## 2018-06-21 DIAGNOSIS — J189 Pneumonia, unspecified organism: Secondary | ICD-10-CM

## 2018-06-21 DIAGNOSIS — E1121 Type 2 diabetes mellitus with diabetic nephropathy: Secondary | ICD-10-CM

## 2018-06-21 DIAGNOSIS — J9601 Acute respiratory failure with hypoxia: Secondary | ICD-10-CM

## 2018-06-21 LAB — BASIC METABOLIC PANEL
Anion gap: 8 (ref 5–15)
BUN: 38 mg/dL — ABNORMAL HIGH (ref 8–23)
CHLORIDE: 108 mmol/L (ref 98–111)
CO2: 24 mmol/L (ref 22–32)
CREATININE: 2.93 mg/dL — AB (ref 0.44–1.00)
Calcium: 8.4 mg/dL — ABNORMAL LOW (ref 8.9–10.3)
GFR calc Af Amer: 18 mL/min — ABNORMAL LOW (ref >60)
GFR, EST NON AFRICAN AMERICAN: 16 mL/min — AB (ref >60)
GLUCOSE: 132 mg/dL — AB (ref 70–99)
POTASSIUM: 4.1 mmol/L (ref 3.5–5.1)
Sodium: 140 mmol/L (ref 135–145)

## 2018-06-21 LAB — CBC
HCT: 35.5 % — ABNORMAL LOW (ref 36.0–46.0)
HEMOGLOBIN: 10.6 g/dL — AB (ref 12.0–15.0)
MCH: 22.5 pg — AB (ref 26.0–34.0)
MCHC: 29.9 g/dL — ABNORMAL LOW (ref 30.0–36.0)
MCV: 75.4 fL — ABNORMAL LOW (ref 80.0–100.0)
NRBC: 0 % (ref 0.0–0.2)
PLATELETS: 232 10*3/uL (ref 150–400)
RBC: 4.71 MIL/uL (ref 3.87–5.11)
RDW: 18.9 % — ABNORMAL HIGH (ref 11.5–15.5)
WBC: 8 10*3/uL (ref 4.0–10.5)

## 2018-06-21 LAB — BLOOD GAS, ARTERIAL
ACID-BASE DEFICIT: 1.4 mmol/L (ref 0.0–2.0)
Bicarbonate: 23.4 mmol/L (ref 20.0–28.0)
Delivery systems: POSITIVE
Drawn by: 511551
EXPIRATORY PAP: 5
FIO2: 40
INSPIRATORY PAP: 12
LHR: 12 {breaths}/min
O2 Saturation: 98.5 %
PH ART: 7.364 (ref 7.350–7.450)
Patient temperature: 97.6
pCO2 arterial: 41.7 mmHg (ref 32.0–48.0)
pO2, Arterial: 130 mmHg — ABNORMAL HIGH (ref 83.0–108.0)

## 2018-06-21 LAB — GLUCOSE, CAPILLARY
GLUCOSE-CAPILLARY: 81 mg/dL (ref 70–99)
GLUCOSE-CAPILLARY: 150 mg/dL — AB (ref 70–99)
GLUCOSE-CAPILLARY: 145 mg/dL — AB (ref 70–99)

## 2018-06-21 LAB — TROPONIN I
TROPONIN I: 0.07 ng/mL — AB (ref <0.03)
TROPONIN I: 0.07 ng/mL — AB (ref <0.03)
TROPONIN I: 0.05 ng/mL — AB (ref <0.03)

## 2018-06-21 LAB — PROTIME-INR
INR: 1.82
Prothrombin Time: 20.9 seconds — ABNORMAL HIGH (ref 11.4–15.2)

## 2018-06-21 LAB — MRSA PCR SCREENING: MRSA by PCR: NEGATIVE

## 2018-06-21 MED ORDER — METOPROLOL TARTRATE 25 MG PO TABS
25.0000 mg | ORAL_TABLET | Freq: Two times a day (BID) | ORAL | Status: DC
  Administered 2018-06-21 – 2018-06-24 (×7): 25 mg via ORAL
  Filled 2018-06-21 (×7): qty 1

## 2018-06-21 MED ORDER — HYDROMORPHONE HCL 1 MG/ML IJ SOLN
0.5000 mg | INTRAMUSCULAR | Status: DC | PRN
  Administered 2018-06-21: 0.5 mg via INTRAVENOUS

## 2018-06-21 MED ORDER — HEPARIN SODIUM (PORCINE) 1000 UNIT/ML IJ SOLN
INTRAMUSCULAR | Status: AC
  Administered 2018-06-21: 14:00:00
  Filled 2018-06-21: qty 4

## 2018-06-21 MED ORDER — CALCITRIOL 0.25 MCG PO CAPS
0.2500 ug | ORAL_CAPSULE | ORAL | Status: DC
  Administered 2018-06-21 – 2018-06-23 (×2): 0.25 ug via ORAL
  Filled 2018-06-21 (×2): qty 1

## 2018-06-21 MED ORDER — CALCITRIOL 0.25 MCG PO CAPS
ORAL_CAPSULE | ORAL | Status: AC
  Filled 2018-06-21: qty 1

## 2018-06-21 MED ORDER — ACETAMINOPHEN 325 MG PO TABS
650.0000 mg | ORAL_TABLET | Freq: Four times a day (QID) | ORAL | Status: DC | PRN
  Administered 2018-06-21: 650 mg via ORAL
  Filled 2018-06-21 (×2): qty 2

## 2018-06-21 MED ORDER — HYDRALAZINE HCL 20 MG/ML IJ SOLN: 10.0000 mg | INTRAMUSCULAR | Status: DC | PRN

## 2018-06-21 MED ORDER — ONDANSETRON HCL 4 MG PO TABS: 4.0000 mg | ORAL_TABLET | Freq: Four times a day (QID) | ORAL | Status: DC | PRN

## 2018-06-21 MED ORDER — INSULIN ASPART 100 UNIT/ML ~~LOC~~ SOLN
0.0000 [IU] | Freq: Three times a day (TID) | SUBCUTANEOUS | Status: DC
  Administered 2018-06-21 – 2018-06-22 (×3): 1 [IU] via SUBCUTANEOUS
  Administered 2018-06-23 – 2018-06-24 (×3): 2 [IU] via SUBCUTANEOUS
  Administered 2018-06-24: 1 [IU] via SUBCUTANEOUS

## 2018-06-21 MED ORDER — SODIUM CHLORIDE 0.9 % IV SOLN: 100.0000 mL | INTRAVENOUS | Status: DC | PRN

## 2018-06-21 MED ORDER — HYDROCODONE-ACETAMINOPHEN 5-325 MG PO TABS
1.0000 | ORAL_TABLET | Freq: Four times a day (QID) | ORAL | Status: DC | PRN
  Filled 2018-06-21 (×2): qty 1

## 2018-06-21 MED ORDER — ZOLPIDEM TARTRATE 5 MG PO TABS
5.0000 mg | ORAL_TABLET | Freq: Every evening | ORAL | Status: DC | PRN
  Administered 2018-06-22: 5 mg via ORAL
  Filled 2018-06-21: qty 1

## 2018-06-21 MED ORDER — WARFARIN - PHARMACIST DOSING INPATIENT
Freq: Every day | Status: DC
  Administered 2018-06-22: 1

## 2018-06-21 MED ORDER — ONDANSETRON HCL 4 MG/2ML IJ SOLN
4.0000 mg | Freq: Four times a day (QID) | INTRAMUSCULAR | Status: DC | PRN
  Administered 2018-06-21: 4 mg via INTRAVENOUS
  Filled 2018-06-21: qty 2

## 2018-06-21 MED ORDER — ACETAMINOPHEN 650 MG RE SUPP: 650.0000 mg | Freq: Four times a day (QID) | RECTAL | Status: DC | PRN

## 2018-06-21 MED ORDER — HYDROMORPHONE HCL 1 MG/ML IJ SOLN
INTRAMUSCULAR | Status: AC
  Administered 2018-06-21: 0.5 mg via INTRAVENOUS
  Filled 2018-06-21: qty 0.5

## 2018-06-21 MED ORDER — WARFARIN SODIUM 2.5 MG PO TABS
2.5000 mg | ORAL_TABLET | Freq: Once | ORAL | Status: AC
  Administered 2018-06-21: 2.5 mg via ORAL
  Filled 2018-06-21: qty 1

## 2018-06-21 MED ORDER — BUTALBITAL-APAP-CAFFEINE 50-325-40 MG PO TABS
1.0000 | ORAL_TABLET | Freq: Four times a day (QID) | ORAL | Status: DC | PRN
  Administered 2018-06-21 – 2018-06-22 (×3): 1 via ORAL
  Filled 2018-06-21 (×3): qty 1

## 2018-06-21 MED ORDER — LIDOCAINE HCL (PF) 1 % IJ SOLN: 5.0000 mL | INTRAMUSCULAR | Status: DC | PRN

## 2018-06-21 MED ORDER — HYDROMORPHONE HCL 1 MG/ML IJ SOLN
0.5000 mg | INTRAMUSCULAR | Status: DC | PRN
  Administered 2018-06-22: 0.5 mg via INTRAVENOUS
  Filled 2018-06-21: qty 1

## 2018-06-21 MED ORDER — HYDROMORPHONE HCL 1 MG/ML IJ SOLN
0.5000 mg | INTRAMUSCULAR | Status: DC | PRN
  Administered 2018-06-21: 1 mg via INTRAVENOUS
  Filled 2018-06-21: qty 1

## 2018-06-21 MED ORDER — VANCOMYCIN HCL IN DEXTROSE 750-5 MG/150ML-% IV SOLN
INTRAVENOUS | Status: AC
  Administered 2018-06-21: 750 mg via INTRAVENOUS
  Filled 2018-06-21: qty 150

## 2018-06-21 MED ORDER — CHLORHEXIDINE GLUCONATE CLOTH 2 % EX PADS
6.0000 | MEDICATED_PAD | Freq: Every day | CUTANEOUS | Status: DC
  Administered 2018-06-22 – 2018-06-24 (×3): 6 via TOPICAL

## 2018-06-21 MED ORDER — HYDROXYZINE HCL 25 MG PO TABS
25.0000 mg | ORAL_TABLET | Freq: Three times a day (TID) | ORAL | Status: DC | PRN
  Administered 2018-06-21 – 2018-06-24 (×4): 25 mg via ORAL
  Filled 2018-06-21 (×4): qty 1

## 2018-06-21 MED ORDER — HYDRALAZINE HCL 20 MG/ML IJ SOLN
5.0000 mg | INTRAMUSCULAR | Status: DC | PRN
  Filled 2018-06-21: qty 1

## 2018-06-21 NOTE — Progress Notes (Signed)
Nutrition Brief Note  Patient identified on the Malnutrition Screening Tool (MST) Report. It was recorded the patient lost weight unintentionally and that she has had decreased intake due to a poor appetite.   Wt Readings from Last 15 Encounters:  06/21/18 66.4 kg  05/15/18 58.4 kg  05/06/18 59.4 kg  05/03/18 57.3 kg  04/25/18 61.3 kg  04/13/18 63.3 kg  04/04/18 64.2 kg  03/25/18 69.6 kg  03/24/18 68.8 kg  03/15/18 72.8 kg   Body mass index is 27.66 kg/m. Patient meets criteria for overweight based on current BMI.   Saw patient shortly after she returned from HD. Contrary to MST report, the patient says she has a great appetite. In fact, her greatest concern at this time is when she was going to get food. She says she is "dying of hunger"  At home, she says she eats 3x a day. She denies any n/v/c/d.   RD asked if she was interested in oral supplements. She grimaced and declined.   Per review of meal intakes, she has eaten 100% of all meals this admission. She does appear to have lost weight since June, but she had been started on dialysis at that time. She appears to have gained weight over past couple months (was 126 in August).   No nutrition interventions warranted at this time. If nutrition issues arise, please consult RD.   Burtis Junes RD, LDN, CNSC Clinical Nutrition Available Tues-Sat via Pager: [phone] 06/21/2018 3:01 PM

## 2018-06-21 NOTE — Procedures (Signed)
   I was present at this dialysis session, have reviewed the session itself and made  appropriate changes Kelly Splinter MD [place] pager [phone]   06/21/2018, 9:15 AM

## 2018-06-21 NOTE — H&P (Signed)
History and Physical    PRINCESSA LESMEISTER GUR:[id number] DOB: 07/22/1956 DOA: 06/20/2018  PCP: Clent Demark, PA-C  Patient coming from: Home.  Chief Complaint: Shortness of breath.  HPI: Rhonda Lynch is a 62 y.o. female with history of ESRD on hemodialysis, ankle associated vasculitis, pulmonary embolism on Coumadin, diabetes mellitus type 2 presents to the ER with multiple complaints including shortness of breath headache abdominal discomfort.  Patient states she has not been doing well for last 2 weeks with nonproductive cough headaches which is global with no focal deficits and has been doing some abdominal discomfort.  Denies any nausea vomiting or diarrhea.  Has been having nonproductive cough.  Missed her last dialysis session due to being unwell.  Shortness of breath worsened so she called the EMS.  ED Course: In the ER patient was hypoxic and it has to be placed on BiPAP.  But not in distress.  Patient was febrile and chest x-ray shows possible infiltrates.  On my exam patient is not in distress moves all extremities still complains of mild headache.  No definite signs of any neck rigidity.  Abdomen is soft to touch.  Review of Systems: As per HPI, rest all negative.   Past Medical History:  Diagnosis Date  . Chronic kidney disease   . Diabetes mellitus without complication ([place])   . ESRD (end stage renal disease) ([place])   . Hypertension   . Oxygen deficiency    2 L at night  . Substance abuse ([place])    has been clean for 4 months     Past Surgical History:  Procedure Laterality Date  . AV FISTULA PLACEMENT Left 03/05/2018   Procedure: ARTERIOVENOUS (AV) FISTULA CREATION  LEFT UPPER EXTREMITY;  Surgeon: [name], MD;  Location: [facility];  Service: Vascular;  Laterality: Left;  . HEMATOMA EVACUATION Left 03/06/2018   Procedure: EVACUATION HEMATOMA LEFT ARM;  Surgeon: Angelia Mould, MD;  Location: [facility];  Service: Vascular;  Laterality: Left;  . INSERTION OF  DIALYSIS CATHETER Left 04/29/2018   Procedure: INSERTION OF DIALYSIS CATHETER LEFT INTERNAL JUGULAR;  Surgeon: Angelia Mould, MD;  Location: [facility];  Service: Vascular;  Laterality: Left;  . IR FLUORO GUIDE CV LINE RIGHT  02/18/2018  . IR FLUORO GUIDE CV LINE RIGHT  02/26/2018  . IR REMOVAL TUN CV CATH W/O FL  04/27/2018  . IR US GUIDE VASC ACCESS RIGHT  02/18/2018  . TEE WITHOUT CARDIOVERSION N/A 04/29/2018   Procedure: TRANSESOPHAGEAL ECHOCARDIOGRAM (TEE);  Surgeon: Sueanne Margarita, MD;  Location: [facility];  Service: Cardiovascular;  Laterality: N/A;     reports that she quit smoking about 2 months ago. She smoked 0.50 packs per day. She has never used smokeless tobacco. She reports that she drinks alcohol. She reports that she has current or past drug history. Drugs: Cocaine and Heroin.  No Known Allergies  Family History  Problem Relation Age of Onset  . Autoimmune disease Neg Hx     Prior to Admission medications   Medication Sig Start Date End Date Taking? Authorizing Provider  acetaminophen (TYLENOL) 325 MG tablet Take 2 tablets (650 mg total) by mouth every 6 (six) hours as needed for mild pain (or Fever >/= 101). 04/24/18   Ghimire, Henreitta Leber, MD  Blood Glucose Monitoring Suppl (ACCU-CHEK AVIVA) device Use as instructed 06/05/18 06/05/19  Clent Demark, PA-C  calcitRIOL (ROCALTROL) 0.25 MCG capsule Take 1 capsule (0.25 mcg total) by mouth every other day.  04/14/18   Nita Sells, MD  feeding supplement, GLUCERNA SHAKE, (GLUCERNA SHAKE) LIQD Take 237 mLs by mouth 2 (two) times daily between meals. Patient not taking: Reported on 04/26/2018 04/24/18   Jonetta Osgood, MD  fluconazole (DIFLUCAN) 200 MG tablet Take 1 tablet (200 mg total) by mouth daily. Patient not taking: Reported on 06/08/2018 05/02/18   Shelly Coss, MD  glucose blood (ACCU-CHEK AVIVA) test strip Use twice per day. 06/05/18   Clent Demark, PA-C  insulin aspart (NOVOLOG) 100 UNIT/ML injection  Inject 0-9 Units into the skin 3 (three) times daily with meals. 04/13/18   Nita Sells, MD  insulin glargine (LANTUS) 100 UNIT/ML injection Inject 0.05 mLs (5 Units total) into the skin at bedtime. 04/13/18   Nita Sells, MD  Lancets (ACCU-CHEK SOFT TOUCH) lancets Use twice per day. 06/05/18   Clent Demark, PA-C  metoprolol tartrate (LOPRESSOR) 25 MG tablet Take 1 tablet (25 mg total) by mouth 2 (two) times daily. 05/02/18   Shelly Coss, MD  multivitamin (RENA-VIT) TABS tablet Take 1 tablet by mouth at bedtime. Patient not taking: Reported on 06/08/2018 04/13/18   Nita Sells, MD  ondansetron (ZOFRAN) 4 MG tablet Take 1 tablet (4 mg total) by mouth every 6 (six) hours as needed for nausea. 04/13/18   Nita Sells, MD  warfarin (COUMADIN) 2.5 MG tablet Take 1 tablet (2.5 mg total) by mouth daily at 6 PM. 05/03/18   Shelly Coss, MD  zolpidem (AMBIEN) 5 MG tablet Take 1 tablet (5 mg total) by mouth at bedtime as needed for sleep. 04/13/18   Nita Sells, MD    Physical Exam: Vitals:   06/20/18 2151 06/20/18 2241 06/20/18 2245 06/20/18 2352  BP:  (!) 172/120 (!) 186/130 (!) 196/129  Pulse:  84 84 87  Resp:  (!) 23 (!) 26 (!) 27  Temp: (!) 100.4 F (38 C)   97.6 F (36.4 C)  TempSrc: Rectal   Axillary  SpO2:  100% 100% 99%  Weight:    66.3 kg  Height:    5\' 1"  (1.549 m)      Constitutional: Moderately built and nourished. Vitals:   06/20/18 2151 06/20/18 2241 06/20/18 2245 06/20/18 2352  BP:  (!) 172/120 (!) 186/130 (!) 196/129  Pulse:  84 84 87  Resp:  (!) 23 (!) 26 (!) 27  Temp: (!) 100.4 F (38 C)   97.6 F (36.4 C)  TempSrc: Rectal   Axillary  SpO2:  100% 100% 99%  Weight:    66.3 kg  Height:    5\' 1"  (1.549 m)   Eyes: Anicteric no pallor. ENMT: No discharge from the ears eyes nose or mouth. Neck: No mass or.  No neck rigidity but no JVD appreciated. Respiratory: No rhonchi mild crepitations. Cardiovascular: S1-S2 heard no  murmurs appreciated. Abdomen: Soft nontender bowel sounds present. Musculoskeletal: No edema.  No joint effusion. Skin: No rash. Neurologic: Alert awake oriented to time place and person.  Moves all extremities. Psychiatric: Appears normal per normal affect.   Labs on Admission: I have personally reviewed following labs and imaging studies  CBC: Recent Labs  Lab 06/20/18 2042  WBC 6.6  NEUTROABS 3.8  HGB 9.8*  HCT 34.7*  MCV 77.5*  PLT 659   Basic Metabolic Panel: Recent Labs  Lab 06/20/18 2042  NA 140  K 3.8  CL 112*  CO2 20*  GLUCOSE 87  BUN 38*  CREATININE 2.84*  CALCIUM 8.3*   GFR: Estimated Creatinine Clearance:  17.4 mL/min (A) (by C-G formula based on SCr of 2.84 mg/dL (H)). Liver Function Tests: Recent Labs  Lab 06/20/18 2042  AST 32  ALT 16  ALKPHOS 76  BILITOT 0.3  PROT 6.0*  ALBUMIN 2.3*   No results for input(s): LIPASE, AMYLASE in the last 168 hours. No results for input(s): AMMONIA in the last 168 hours. Coagulation Profile: Recent Labs  Lab 06/20/18 2042  INR 1.76   Cardiac Enzymes: Recent Labs  Lab 06/20/18 2042  TROPONINI 0.05*   BNP (last 3 results) No results for input(s): PROBNP in the last 8760 hours. HbA1C: No results for input(s): HGBA1C in the last 72 hours. CBG: Recent Labs  Lab 06/20/18 2027 06/20/18 2201  GLUCAP 96 76   Lipid Profile: No results for input(s): CHOL, HDL, LDLCALC, TRIG, CHOLHDL, LDLDIRECT in the last 72 hours. Thyroid Function Tests: No results for input(s): TSH, T4TOTAL, FREET4, T3FREE, THYROIDAB in the last 72 hours. Anemia Panel: No results for input(s): VITAMINB12, FOLATE, FERRITIN, TIBC, IRON, RETICCTPCT in the last 72 hours. Urine analysis:    Component Value Date/Time   COLORURINE AMBER (A) 05/06/2018 1523   APPEARANCEUR HAZY (A) 05/06/2018 1523   LABSPEC 1.017 05/06/2018 1523   PHURINE 6.0 05/06/2018 1523   GLUCOSEU 50 (A) 05/06/2018 1523   HGBUR LARGE (A) 05/06/2018 1523   HGBUR  trace-intact 03/08/2010 1452   BILIRUBINUR NEGATIVE 05/06/2018 1523   KETONESUR NEGATIVE 05/06/2018 1523   PROTEINUR >=300 (A) 05/06/2018 1523   UROBILINOGEN 0.2 03/08/2010 1452   NITRITE NEGATIVE 05/06/2018 1523   LEUKOCYTESUR MODERATE (A) 05/06/2018 1523   Sepsis Labs: @LABRCNTIP (procalcitonin:4,lacticidven:4) )No results found for this or any previous visit (from the past 240 hour(s)).   Radiological Exams on Admission: Dg Chest Port 1 View  Result Date: 06/20/2018 CLINICAL DATA:  Initial evaluation for acute shortness of breath, cough. EXAM: PORTABLE CHEST 1 VIEW COMPARISON:  Prior radiograph from 06/07/2018. FINDINGS: Right-sided dual lumen hemodialysis catheter in place with tips overlying the cavoatrial junction. Moderate cardiomegaly, stable. Mediastinal silhouette within normal limits. Aortic atherosclerosis. Lungs mildly hypoinflated. Mild diffuse pulmonary vascular and interstitial congestion. Pat superimposed patchy right lower lobe opacity suspicious for possible infiltrate. Associated small moderate right pleural effusion with subpulmonic component. No pneumothorax. No acute osseous abnormality. IMPRESSION: 1. Patchy right lower lobe opacity, concerning for pneumonia given provided history of cough. This has worsened from 06/07/2018. 2. Associated small to moderate right pleural effusion. 3. Cardiomegaly with underlying mild diffuse pulmonary interstitial congestion/edema. 4. Aortic atherosclerosis. Electronically Signed   By: Jeannine Boga M.D.   On: 06/20/2018 22:00      Assessment/Plan Principal Problem:   Acute respiratory failure with hypoxia (HCC) Active Problems:   ESRD (end stage renal disease) ([place])   DM (diabetes mellitus), type 2 with renal complications ([place])   HCAP (healthcare-associated pneumonia)    1. Acute respiratory failure with hypoxia likely from fluid overload but however since patient has fever we will keep patient on empiric antibiotics for  healthcare associated pneumonia.  Patient is on BiPAP which will be continued.  Consulted nephrology for dialysis.  Note that patient was admitted for fungemia in August 2 months ago. 2. Hypertensive urgency likely worsening patient's respiratory symptoms.  In addition to patient's home medications we will keep patient on PRN IV hydralazine and continue home dose of metoprolol.  I think blood pressure will improve with dialysis.  Closely monitor. 3. Headache -no signs of any meningitis.  Appears alert awake and oriented.  No focal  deficits.  Check CT head. 4. Abdominal discomfort -abdomen appears benign.  Check CT. 5. Diabetes mellitus type 2 -we will keep patient on sliding scale coverage for now. 6. Anemia likely from ESRD.  Follow CBC. 7. Mildly elevated troponin likely from pulmonary edema.  Denies any chest pain we will trend troponins.  On Coumadin. 8. History of ANCA related vasculitis.  Presently not on any medication.   DVT prophylaxis: Coumadin. Code Status: Full code. Family Communication: Discussed with patient. Disposition Plan: Home. Consults called: Nephrology. Admission status: Inpatient.   Rise Patience MD [facility] Pager [phone].  If 7PM-7AM, please contact night-coverage [email or web address] Password [email or web address]  06/21/2018, 12:53 AM

## 2018-06-21 NOTE — Progress Notes (Signed)
ANTICOAGULATION CONSULT NOTE - Initial Consult  Pharmacy Consult for Coumadin Indication: h/o PE  No Known Allergies  Patient Measurements: Height: 5\' 1"  (154.9 cm) Weight: 146 lb 2.6 oz (66.3 kg) IBW/kg (Calculated) : 47.8  Vital Signs: Temp: 97.6 F (36.4 C) (10/11 2352) Temp Source: Axillary (10/11 2352) BP: 196/129 (10/11 2352) Pulse Rate: 87 (10/11 2352)  Labs: Recent Labs    06/20/18 2042  HGB 9.8*  HCT 34.7*  PLT 204  LABPROT 20.3*  INR 1.76  CREATININE 2.84*  TROPONINI 0.05*    Estimated Creatinine Clearance: 17.4 mL/min (A) (by C-G formula based on SCr of 2.84 mg/dL (H)).   Medical History: Past Medical History:  Diagnosis Date  . Chronic kidney disease   . Diabetes mellitus without complication ([place])   . ESRD (end stage renal disease) ([place])   . Hypertension   . Oxygen deficiency    2 L at night  . Substance abuse ([place])    has been clean for 4 months     Medications:  No current facility-administered medications on file prior to encounter.    Current Outpatient Medications on File Prior to Encounter  Medication Sig Dispense Refill  . acetaminophen (TYLENOL) 325 MG tablet Take 2 tablets (650 mg total) by mouth every 6 (six) hours as needed for mild pain (or Fever >/= 101).    . Blood Glucose Monitoring Suppl (ACCU-CHEK AVIVA) device Use as instructed 1 each 0  . calcitRIOL (ROCALTROL) 0.25 MCG capsule Take 1 capsule (0.25 mcg total) by mouth every other day. 30 capsule 0  . feeding supplement, GLUCERNA SHAKE, (GLUCERNA SHAKE) LIQD Take 237 mLs by mouth 2 (two) times daily between meals. (Patient not taking: Reported on 04/26/2018) 60 Can 0  . fluconazole (DIFLUCAN) 200 MG tablet Take 1 tablet (200 mg total) by mouth daily. (Patient not taking: Reported on 06/08/2018) 30 tablet 0  . glucose blood (ACCU-CHEK AVIVA) test strip Use twice per day. 60 each 5  . insulin aspart (NOVOLOG) 100 UNIT/ML injection Inject 0-9 Units into the skin 3 (three) times  daily with meals. 10 mL 11  . insulin glargine (LANTUS) 100 UNIT/ML injection Inject 0.05 mLs (5 Units total) into the skin at bedtime. 10 mL 11  . Lancets (ACCU-CHEK SOFT TOUCH) lancets Use twice per day. 60 each 5  . metoprolol tartrate (LOPRESSOR) 25 MG tablet Take 1 tablet (25 mg total) by mouth 2 (two) times daily. 60 tablet 0  . multivitamin (RENA-VIT) TABS tablet Take 1 tablet by mouth at bedtime. (Patient not taking: Reported on 06/08/2018) 30 each 0  . ondansetron (ZOFRAN) 4 MG tablet Take 1 tablet (4 mg total) by mouth every 6 (six) hours as needed for nausea. 20 tablet 0  . warfarin (COUMADIN) 2.5 MG tablet Take 1 tablet (2.5 mg total) by mouth daily at 6 PM. 30 tablet 0  . zolpidem (AMBIEN) 5 MG tablet Take 1 tablet (5 mg total) by mouth at bedtime as needed for sleep. 30 tablet 0     Assessment: 62 y.o. female admitted with sob, h/o recent PE, to continue Coumadin Goal of Therapy:  INR 2-3 Monitor platelets by anticoagulation protocol: Yes   Plan:  Coumadin 2.5 mg tonight Daily INR  Raffaele Derise, [place]urb 06/21/2018,12:56 AM

## 2018-06-21 NOTE — Progress Notes (Signed)
Stat ABG drawn and waiting on results.

## 2018-06-21 NOTE — Plan of Care (Signed)
Patient was extremely drowsy and non-responsive.  Discussed plan of care for the patient. Emphasized the importance of using the call bell when assistance is needed.  Patient currently goes to dialysis on Tuesdays, Thursdays, and Saturdays.  Explained the importance of keeping every dialysis appointment.  One missed appointment can cause a hospital admission.  Minimal teach back displayed.

## 2018-06-21 NOTE — Progress Notes (Signed)
PT transported from 2W 09 to CT and back with no complications.

## 2018-06-21 NOTE — Progress Notes (Signed)
[facility] PLAN OF CARE NOTE Patient: Rhonda Lynch [id number]   PCP: Clent Demark, [place] DOB: May 25, 1956   DOA: 06/20/2018   DOS: 06/21/2018    Patient was admitted by my colleague Dr. Hal Hope  earlier on 06/21/2018. I have reviewed the H&P as well as assessment and plan and agree with the same. Important changes in the plan are listed below.  Plan of care: Principal Problem:   Acute respiratory failure with hypoxia ([place]) Active Problems:   ESRD (end stage renal disease) ([place])   DM (diabetes mellitus), type 2 with renal complications ([place])   HCAP (healthcare-associated pneumonia) Headache, CT head unremarkable.  No focal deficit. Added pain medications Prefer not to use IV pain medications  Abdominal pain. CT abdomen pelvis unremarkable. Still has some pain likely associated with CHF and volume overload.   Hypertensive urgency. Blood pressure better after HD.  Continue to monitor.  Author: Berle Mull, MD [facility] Pager: [phone] 06/21/2018 3:36 PM   If 7PM-7AM, please contact night-coverage at [email or web address], password [facility]

## 2018-06-21 NOTE — Consult Note (Addendum)
Renal Service Consult Note [facility]  Rhonda Lynch 06/21/2018 [name] Requesting Physician:  Dr Posey Pronto, P  Reason for Consult:  ESRD pt w/ SOB HPI: The patient is a 62 y.o. year-old presented w/ HA and SOB x 1-2 days to ED.  Missed Thursday HD, usual TTS.  ESRD pt. DM. Hx BOOP over summer rx'd w/ steroids. States headaches have been a problems for 3 weeks, tried aleve, can't take tylenol upsets her stomach.  No blurry vision.  SOB started yesterday, missed HD Thursday. +fever and slight chill yesterday.  No abd pain, no n/v/d, no chest pain.  Has L arm AVF she's not sure if it's working or not, using TDC now for HD.  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   ROS  denies CP  no joint pain   no rash  no diarrhea  no nausea/ vomiting   Past Medical History  Past Medical History:  Diagnosis Date  . Chronic kidney disease   . Diabetes mellitus without complication ([place])   . ESRD (end stage renal disease) ([place])   . Hypertension   . Oxygen deficiency    2 L at night  . Substance abuse ([place])    has been clean for 4 months    Past Surgical History  Past Surgical History:  Procedure Laterality Date  . AV FISTULA PLACEMENT Left 03/05/2018   Procedure: ARTERIOVENOUS (AV) FISTULA CREATION  LEFT UPPER EXTREMITY;  Surgeon: [name], MD;  Location: [facility];  Service: Vascular;  Laterality: Left;  . HEMATOMA EVACUATION Left 03/06/2018   Procedure: EVACUATION HEMATOMA LEFT ARM;  Surgeon: Angelia Mould, MD;  Location: [facility];  Service: Vascular;  Laterality: Left;  . INSERTION OF DIALYSIS CATHETER Left 04/29/2018   Procedure: INSERTION OF DIALYSIS CATHETER LEFT INTERNAL JUGULAR;  Surgeon: Angelia Mould, MD;  Location: [facility];  Service: Vascular;  Laterality: Left;  . IR FLUORO GUIDE CV LINE RIGHT  02/18/2018  . IR FLUORO GUIDE CV LINE RIGHT  02/26/2018  . IR REMOVAL TUN CV CATH W/O FL  04/27/2018  . IR US GUIDE VASC ACCESS RIGHT   02/18/2018  . TEE WITHOUT CARDIOVERSION N/A 04/29/2018   Procedure: TRANSESOPHAGEAL ECHOCARDIOGRAM (TEE);  Surgeon: Sueanne Margarita, MD;  Location: [facility];  Service: Cardiovascular;  Laterality: N/A;   Family History  Family History  Problem Relation Age of Onset  . Autoimmune disease Neg Hx    Social History  reports that she quit smoking about 2 months ago. She smoked 0.50 packs per day. She has never used smokeless tobacco. She reports that she drinks alcohol. She reports that she has current or past drug history. Drugs: Cocaine and Heroin. Allergies No Known Allergies Home medications Prior to Admission medications   Medication Sig Start Date End Date Taking? Authorizing Provider  acetaminophen (TYLENOL) 325 MG tablet Take 2 tablets (650 mg total) by mouth every 6 (six) hours as needed for mild pain (or Fever >/= 101). 04/24/18   Ghimire, Henreitta Leber, MD  Blood Glucose Monitoring Suppl (ACCU-CHEK AVIVA) device Use as instructed 06/05/18 06/05/19  Clent Demark, PA-C  calcitRIOL (ROCALTROL) 0.25 MCG capsule Take 1 capsule (0.25 mcg total) by mouth every other day. 04/14/18   Nita Sells, MD  feeding supplement, GLUCERNA SHAKE, (GLUCERNA SHAKE) LIQD Take 237 mLs by mouth 2 (two) times daily between meals. Patient not taking: Reported on 04/26/2018 04/24/18   Jonetta Osgood, MD  fluconazole (DIFLUCAN)  200 MG tablet Take 1 tablet (200 mg total) by mouth daily. Patient not taking: Reported on 06/08/2018 05/02/18   Shelly Coss, MD  glucose blood (ACCU-CHEK AVIVA) test strip Use twice per day. 06/05/18   Clent Demark, PA-C  insulin aspart (NOVOLOG) 100 UNIT/ML injection Inject 0-9 Units into the skin 3 (three) times daily with meals. 04/13/18   Nita Sells, MD  insulin glargine (LANTUS) 100 UNIT/ML injection Inject 0.05 mLs (5 Units total) into the skin at bedtime. 04/13/18   Nita Sells, MD  Lancets (ACCU-CHEK SOFT TOUCH) lancets Use twice per day. 06/05/18    Clent Demark, PA-C  metoprolol tartrate (LOPRESSOR) 25 MG tablet Take 1 tablet (25 mg total) by mouth 2 (two) times daily. 05/02/18   Shelly Coss, MD  multivitamin (RENA-VIT) TABS tablet Take 1 tablet by mouth at bedtime. Patient not taking: Reported on 06/08/2018 04/13/18   Nita Sells, MD  ondansetron (ZOFRAN) 4 MG tablet Take 1 tablet (4 mg total) by mouth every 6 (six) hours as needed for nausea. 04/13/18   Nita Sells, MD  warfarin (COUMADIN) 2.5 MG tablet Take 1 tablet (2.5 mg total) by mouth daily at 6 PM. 05/03/18   Shelly Coss, MD  zolpidem (AMBIEN) 5 MG tablet Take 1 tablet (5 mg total) by mouth at bedtime as needed for sleep. 04/13/18   Nita Sells, MD   Liver Function Tests Recent Labs  Lab 06/20/18 2042  AST 32  ALT 16  ALKPHOS 76  BILITOT 0.3  PROT 6.0*  ALBUMIN 2.3*   No results for input(s): LIPASE, AMYLASE in the last 168 hours. CBC Recent Labs  Lab 06/20/18 2042  WBC 6.6  NEUTROABS 3.8  HGB 9.8*  HCT 34.7*  MCV 77.5*  PLT 631   Basic Metabolic Panel Recent Labs  Lab 06/20/18 2042  NA 140  K 3.8  CL 112*  CO2 20*  GLUCOSE 87  BUN 38*  CREATININE 2.84*  CALCIUM 8.3*   Iron/TIBC/Ferritin/ %Sat    Component Value Date/Time   IRON 114 03/22/2018 0750   TIBC 225 (L) 03/22/2018 0750   FERRITIN 958 (H) 03/22/2018 0750   IRONPCTSAT 51 (H) 03/22/2018 0750    Vitals:   06/21/18 0300 06/21/18 0418 06/21/18 0700 06/21/18 0751  BP: (!) 141/62  (!) 185/94   Pulse: 70  76   Resp: 18 19 18    Temp: 97.7 F (36.5 C)     TempSrc: Oral     SpO2: 100% 100% 100% 100%  Weight:      Height:       Exam Gen in pain, crying, headache No rash, cyanosis or gangrene Sclera anicteric, throat clear No jvd or bruits Chest mostly clear, no rales or wheezing RRR no MRG Abd soft ntnd no mass or ascites +bs GU defer MS no joint effusions or deformity Ext no LE or UE edema, no wounds or ulcers Neuro is alert, Ox 3 , nf R IJ TDC/  LUA avf no bruit   Home meds:  - insulin aspart 0-9 tid ac/ insulin glargine 5u qd  - warfarin 2.5 hs  - MVI/ prns/ vitamins  - metoprolol tartrate 25 bid  - diflucan 200 qd   Dialysis: [facility]  4h  59kg   2/2.5 bath  Hep 2000   R IJ TDC./ maturing avf - mircera 150 last 10/03 - vit D 0.25 ug tiw     Impression/ Plan: 1. SOB - CXR a lot worse, pulm edema vs  recurrent autoimmune disease. Missed last HD due to recurrent HA's.  Plan HD today, max vol removal as tolerated, post HD wts.   2. ESRD - HD TTS.  Missed Thursday. HD today.  3. HTN - cont metop, get vol down 4. Hx BOOP/ autoimmune lung disease - sp course of steroids this summer 5. DM on insulin 6. Anemia ckd - Hb 9.8, esa when needed 7. MBD ckd - cont meds 8. HTN - BP's high per pt , may be cause of headaches. Get vol down, may need more BP Rx.     Kelly Splinter MD [employer] pager [phone]   06/21/2018, 9:12 AM

## 2018-06-22 ENCOUNTER — Inpatient Hospital Stay (HOSPITAL_COMMUNITY): Payer: Medicare Other

## 2018-06-22 LAB — CBC
HEMATOCRIT: 38.6 % (ref 36.0–46.0)
Hemoglobin: 11.2 g/dL — ABNORMAL LOW (ref 12.0–15.0)
MCH: 21.9 pg — AB (ref 26.0–34.0)
MCHC: 29 g/dL — AB (ref 30.0–36.0)
MCV: 75.4 fL — AB (ref 80.0–100.0)
NRBC: 0 % (ref 0.0–0.2)
PLATELETS: 215 10*3/uL (ref 150–400)
RBC: 5.12 MIL/uL — ABNORMAL HIGH (ref 3.87–5.11)
RDW: 18.9 % — AB (ref 11.5–15.5)
WBC: 5.6 10*3/uL (ref 4.0–10.5)

## 2018-06-22 LAB — RENAL FUNCTION PANEL
Albumin: 2.2 g/dL — ABNORMAL LOW (ref 3.5–5.0)
Anion gap: 10 (ref 5–15)
BUN: 11 mg/dL (ref 8–23)
CHLORIDE: 100 mmol/L (ref 98–111)
CO2: 27 mmol/L (ref 22–32)
CREATININE: 2.23 mg/dL — AB (ref 0.44–1.00)
Calcium: 8.2 mg/dL — ABNORMAL LOW (ref 8.9–10.3)
GFR calc Af Amer: 26 mL/min — ABNORMAL LOW (ref >60)
GFR calc non Af Amer: 22 mL/min — ABNORMAL LOW (ref >60)
GLUCOSE: 101 mg/dL — AB (ref 70–99)
Phosphorus: 4.3 mg/dL (ref 2.5–4.6)
Potassium: 3.7 mmol/L (ref 3.5–5.1)
Sodium: 137 mmol/L (ref 135–145)

## 2018-06-22 LAB — HEPATITIS B CORE ANTIBODY, TOTAL: HEP B C TOTAL AB: NEGATIVE

## 2018-06-22 LAB — PROTIME-INR
INR: 2.06
PROTHROMBIN TIME: 23.1 s — AB (ref 11.4–15.2)

## 2018-06-22 LAB — HEPATITIS B SURFACE ANTIGEN: HEP B S AG: NEGATIVE

## 2018-06-22 LAB — GLUCOSE, CAPILLARY
GLUCOSE-CAPILLARY: 106 mg/dL — AB (ref 70–99)
Glucose-Capillary: 73 mg/dL (ref 70–99)
Glucose-Capillary: 141 mg/dL — ABNORMAL HIGH (ref 70–99)
Glucose-Capillary: 126 mg/dL — ABNORMAL HIGH (ref 70–99)

## 2018-06-22 LAB — HEPATITIS B SURFACE ANTIBODY,QUALITATIVE: HEP B S AB: NONREACTIVE

## 2018-06-22 MED ORDER — WARFARIN SODIUM 2.5 MG PO TABS
2.5000 mg | ORAL_TABLET | Freq: Once | ORAL | Status: AC
  Administered 2018-06-22: 2.5 mg via ORAL
  Filled 2018-06-22: qty 1

## 2018-06-22 MED ORDER — AZITHROMYCIN 250 MG PO TABS
500.0000 mg | ORAL_TABLET | Freq: Every day | ORAL | Status: AC
  Administered 2018-06-22 – 2018-06-24 (×3): 500 mg via ORAL
  Filled 2018-06-22 (×3): qty 2

## 2018-06-22 MED ORDER — TRAMADOL HCL 50 MG PO TABS
50.0000 mg | ORAL_TABLET | Freq: Two times a day (BID) | ORAL | Status: DC | PRN
  Administered 2018-06-22 – 2018-06-23 (×2): 50 mg via ORAL
  Filled 2018-06-22: qty 1

## 2018-06-22 MED ORDER — CHLORHEXIDINE GLUCONATE CLOTH 2 % EX PADS: 6.0000 | MEDICATED_PAD | Freq: Every day | CUTANEOUS | Status: DC

## 2018-06-22 MED ORDER — AMOXICILLIN-POT CLAVULANATE 500-125 MG PO TABS
1.0000 | ORAL_TABLET | Freq: Two times a day (BID) | ORAL | Status: DC
  Administered 2018-06-22 – 2018-06-24 (×5): 500 mg via ORAL
  Filled 2018-06-22 (×6): qty 1

## 2018-06-22 NOTE — Progress Notes (Signed)
[facility] Progress Note  Subjective: breathing much better.  3.5 L Net UF on HD yesterday but still up 4kg.   Vitals:   06/22/18 0759 06/22/18 0856 06/22/18 0900 06/22/18 1000  BP: 126/80 (!) 125/99 131/63 132/64  Pulse: 68 77 75 69  Resp: 19 15 18 14   Temp: 98.7 F (37.1 C)     TempSrc: Oral     SpO2: 100% 96% 96% 100%  Weight:      Height:        Inpatient medications: . amoxicillin-clavulanate  1 tablet Oral BID  . azithromycin  500 mg Oral Daily  . calcitRIOL  0.25 mcg Oral QODAY  . Chlorhexidine Gluconate Cloth  6 each Topical Q0600  . insulin aspart  0-9 Units Subcutaneous TID WC  . metoprolol tartrate  25 mg Oral BID  . warfarin  2.5 mg Oral ONCE-1800  . Warfarin - Pharmacist Dosing Inpatient   Does not apply q1800    acetaminophen **OR** acetaminophen, butalbital-acetaminophen-caffeine, hydrALAZINE, HYDROcodone-acetaminophen, hydrOXYzine, ondansetron **OR** ondansetron (ZOFRAN) IV, zolpidem  Iron/TIBC/Ferritin/ %Sat    Component Value Date/Time   IRON 114 03/22/2018 0750   TIBC 225 (L) 03/22/2018 0750   FERRITIN 958 (H) 03/22/2018 0750   IRONPCTSAT 51 (H) 03/22/2018 0750    Exam: Gen alert, no distres,s much better No jvd or bruits Chest dec'd at bases, no rales RRR no MRG Abd soft ntnd no mass or ascites +bs Ext no LE or UE edema Neuro is alert, Ox 3 , nf R IJ TDC/ LUA avf no bruit   Home meds:  - insulin aspart 0-9 tid ac/ insulin glargine 5u qd  - warfarin 2.5 hs  - MVI/ prns/ vitamins  - metoprolol tartrate 25 bid  - diflucan 200 qd   D[facility]: [facility]  4h  59kg   2/2.5 bath  Hep 2000   R IJ TDC./ maturing avf - mircera 150 last 10/03 - vit D 0.25 ug tiw     Impression/ Plan: 1. SOB/ pulm edema / vol overload - CXR better after HD yest, c/w pulm edema. Plan extra tomorrow am to get vol down closer to dry wt.  Needs to cut back fluid intake, have d/w pt.  Should be OK for dc after HD Monday.  2. ESRD - HD TTS. AVF  not working, pt not sure details.  3. HTN - cont metoprolol 4. Hx BOOP/ autoimmune lung disease - sp course of steroids this summer 5. DM on insulin 6. Anemia ckd - Hb 9.8, esa when needed 7. MBD ckd - cont meds 8. HTN - BP's have been up, prob due to chronic vol overload     Kelly Splinter MD [place] pager [phone]   06/22/2018, 1:29 PM   Recent Labs  Lab 06/20/18 2042 06/21/18 0909 06/22/18 0244  NA 140 140 137  K 3.8 4.1 3.7  CL 112* 108 100  CO2 20* 24 27  GLUCOSE 87 132* 101*  BUN 38* 38* 11  CREATININE 2.84* 2.93* 2.23*  CALCIUM 8.3* 8.4* 8.2*  PHOS  --   --  4.3  ALBUMIN 2.3*  --  2.2*  INR 1.76 1.82 2.06   Recent Labs  Lab 06/20/18 2042  AST 32  ALT 16  ALKPHOS 76  BILITOT 0.3  PROT 6.0*   Recent Labs  Lab 06/20/18 2042 06/21/18 0909 06/22/18 0244  WBC 6.6 8.0 5.6  NEUTROABS 3.8  --   --   HGB 9.8* 10.6* 11.2*  HCT 34.7* 35.5* 38.6  MCV 77.5* 75.4* 75.4*  PLT 204 232 215

## 2018-06-22 NOTE — Progress Notes (Signed)
BIPAP on standy-by. No distress noted.

## 2018-06-22 NOTE — Progress Notes (Addendum)
ANTICOAGULATION CONSULT NOTE - follow-up Consult  Pharmacy Consult for Coumadin Indication: h/o PE  No Known Allergies  Patient Measurements: Height: 5\' 1"  (154.9 cm) Weight: 138 lb 10.7 oz (62.9 kg) IBW/kg (Calculated) : 47.8  Vital Signs: Temp: 98 F (36.7 C) (10/13 0300) Temp Source: Oral (10/13 0300) BP: 145/93 (10/12 2300) Pulse Rate: 68 (10/13 0300)  Labs: Recent Labs    06/20/18 2042 06/21/18 0229 06/21/18 0909 06/21/18 1417 06/22/18 0244  HGB 9.8*  --  10.6*  --  11.2*  HCT 34.7*  --  35.5*  --  38.6  PLT 204  --  232  --  215  LABPROT 20.3*  --  20.9*  --  23.1*  INR 1.76  --  1.82  --  2.06  CREATININE 2.84*  --  2.93*  --  2.23*  TROPONINI 0.05* 0.07* 0.07* 0.05*  --     Estimated Creatinine Clearance: 21.6 mL/min (A) (by C-G formula based on SCr of 2.23 mg/dL (H)).   Medical History: Past Medical History:  Diagnosis Date  . Chronic kidney disease   . Diabetes mellitus without complication ([place])   . ESRD (end stage renal disease) ([place])   . Hypertension   . Oxygen deficiency    2 L at night  . Substance abuse ([place])    has been clean for 4 months     Medications:  No current facility-administered medications on file prior to encounter.    Current Outpatient Medications on File Prior to Encounter  Medication Sig Dispense Refill  . acetaminophen (TYLENOL) 325 MG tablet Take 2 tablets (650 mg total) by mouth every 6 (six) hours as needed for mild pain (or Fever >/= 101).    . Blood Glucose Monitoring Suppl (ACCU-CHEK AVIVA) device Use as instructed 1 each 0  . calcitRIOL (ROCALTROL) 0.25 MCG capsule Take 1 capsule (0.25 mcg total) by mouth every other day. 30 capsule 0  . feeding supplement, GLUCERNA SHAKE, (GLUCERNA SHAKE) LIQD Take 237 mLs by mouth 2 (two) times daily between meals. (Patient not taking: Reported on 04/26/2018) 60 Can 0  . fluconazole (DIFLUCAN) 200 MG tablet Take 1 tablet (200 mg total) by mouth daily. (Patient not taking: Reported  on 06/08/2018) 30 tablet 0  . glucose blood (ACCU-CHEK AVIVA) test strip Use twice per day. 60 each 5  . insulin aspart (NOVOLOG) 100 UNIT/ML injection Inject 0-9 Units into the skin 3 (three) times daily with meals. 10 mL 11  . insulin glargine (LANTUS) 100 UNIT/ML injection Inject 0.05 mLs (5 Units total) into the skin at bedtime. 10 mL 11  . Lancets (ACCU-CHEK SOFT TOUCH) lancets Use twice per day. 60 each 5  . metoprolol tartrate (LOPRESSOR) 25 MG tablet Take 1 tablet (25 mg total) by mouth 2 (two) times daily. 60 tablet 0  . multivitamin (RENA-VIT) TABS tablet Take 1 tablet by mouth at bedtime. (Patient not taking: Reported on 06/08/2018) 30 each 0  . ondansetron (ZOFRAN) 4 MG tablet Take 1 tablet (4 mg total) by mouth every 6 (six) hours as needed for nausea. 20 tablet 0  . warfarin (COUMADIN) 2.5 MG tablet Take 1 tablet (2.5 mg total) by mouth daily at 6 PM. 30 tablet 0  . zolpidem (AMBIEN) 5 MG tablet Take 1 tablet (5 mg total) by mouth at bedtime as needed for sleep. 30 tablet 0     Assessment: 62 y.o. female admitted with sob, PMH significant for recent PE, ESRD on HD TTS, HTN, DM. Pharmacy  consulted to continue patient's warfarin. PTA dose of warfarin 2.5mg  po every day.  INR 2.06 this morning up from 1.82 yesterday, CBC stable. No signs or symptoms of bleeding noted.  Goal of Therapy:  INR 2-3 Monitor platelets by anticoagulation protocol: Yes   Plan:  Warfarin PO 2.5 mg tonight Daily INR, CBC, monitor signs and symptoms of bleeding.  Thank you for allowing pharmacy to be a part of this patient's care.  Tamela Gammon, PharmD 06/22/2018 7:47 AM PGY-1 Pharmacy Resident Phone: [phone] Please check [email or web address] for unit-specific pharmacist phone numbers after 3:00 P.M.

## 2018-06-22 NOTE — Progress Notes (Signed)
[facility] Progress Note  Patient: Rhonda Lynch KGU:[id number]   PCP: Clent Demark, PA-C DOB: [birth year]   DOA: 06/20/2018   DOS: 06/22/2018   Date of Service: the patient was seen and examined on 06/22/2018  Brief hospital course: Pt. with PMH of ESRD on HD, type II DM, HTN, ANCA vasculitis, PE on Coumadin; admitted on 06/20/2018, presented with complaint of shortness of breath, was found to have acute volume overload secondary to missing hemodialysis. Currently further plan is continue current care.  Subjective: Complain about headache.  Within a few seconds the headache will move from left to the right side.  No focal deficit no dizziness no lightheadedness.  Breathing is better.  Oral intake is better.  She reports some vomiting which is not reported to me.  Had some nausea this morning as well.  Assessment and Plan: 1.  Acute hypoxic respiratory failure. Acute on chronic diastolic CHF. ESRD on HD TTS Missed hemodialysis Hypertensive urgency Presented with shortness of breath and headache.  Blood pressure was significantly elevated in the ED.  Patient missed one session of hemodialysis due to not feeling well outpatient. Significantly volume overloaded with weight up from her dry weight. Nephrology was consulted patient underwent urgent HD yesterday and will get another acute HD tomorrow. Blood pressure significantly better after HD continue current medications.  2.  Headache. No concerning signs. CT head unremarkable. Patient will not take anything with Tylenol in it. Benadryl Reglan.  3.  Abdominal pain. Patient reported abdominal pain CT abdomen on admission was unremarkable. Pain has currently resolved. Her nausea and vomiting secondary narcotics that she got for headache. Discontinue IV narcotics.  4.  Type 2 diabetes mellitus. Blood sugars elevated. Continue sliding scale insulin.  5.  Anemia of chronic kidney disease. H&H stable.  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  7. Elevated troponin. Demand ischemia. No concern for ACS.   Diet: renal diet DVT Prophylaxis: subcutaneous Heparin  Advance goals of care discussion: full code  Family Communication: no family was present at bedside, at the time of interview.   Disposition:  Discharge to home tomorrow after HD.  Consultants: nephrology Procedures: HD  Scheduled Meds: . amoxicillin-clavulanate  1 tablet Oral BID  . azithromycin  500 mg Oral Daily  . calcitRIOL  0.25 mcg Oral QODAY  . Chlorhexidine Gluconate Cloth  6 each Topical Q0600  . insulin aspart  0-9 Units Subcutaneous TID WC  . metoprolol tartrate  25 mg Oral BID  . Warfarin - Pharmacist Dosing Inpatient   Does not apply q1800   Continuous Infusions: PRN Meds: acetaminophen **OR** acetaminophen, butalbital-acetaminophen-caffeine, hydrALAZINE, hydrOXYzine, ondansetron **OR** ondansetron (ZOFRAN) IV, traMADol, zolpidem Antibiotics: Anti-infectives (From admission, onward)   Start     Dose/Rate Route Frequency Ordered Stop   06/22/18 1000  azithromycin (ZITHROMAX) tablet 500 mg     500 mg Oral Daily 06/22/18 0942     06/22/18 1000  amoxicillin-clavulanate (AUGMENTIN) 500-125 MG per tablet 500 mg     1 tablet Oral 2 times daily 06/22/18 0942     06/21/18 1800  ceFEPIme (MAXIPIME) 2 g in sodium chloride 0.9 % 100 mL IVPB  Status:  Discontinued     2 g 200 mL/hr over 30 Minutes Intravenous Every T-Th-Sa (1800) 06/20/18 2214 06/22/18 0941   06/21/18 1200  vancomycin (VANCOCIN) IVPB 750 mg/150 ml premix  Status:  Discontinued     750 mg 150 mL/hr over 60 Minutes Intravenous Every T-Th-Sa (Hemodialysis) 06/20/18 2214 06/22/18 6283  06/20/18 2200  ceFEPIme (MAXIPIME) 2 g in sodium chloride 0.9 % 100 mL IVPB     2 g 200 mL/hr over 30 Minutes Intravenous  Once 06/20/18 2158 06/20/18 2310   06/20/18 2200  vancomycin (VANCOCIN) IVPB 1000 mg/200 mL premix     1,000 mg 200 mL/hr over 60 Minutes Intravenous   Once 06/20/18 2158 06/21/18 0200       Objective: Physical Exam: Vitals:   06/22/18 0856 06/22/18 0900 06/22/18 1000 06/22/18 1639  BP: (!) 125/99 131/63 132/64 (!) 138/106  Pulse: 77 75 69 76  Resp: 15 18 14  (!) 21  Temp:    98.3 F (36.8 C)  TempSrc:    Oral  SpO2: 96% 96% 100% 100%  Weight:      Height:        Intake/Output Summary (Last 24 hours) at 06/22/2018 1724 Last data filed at 06/22/2018 0856 Gross per 24 hour  Intake 240 ml  Output 101 ml  Net 139 ml   Filed Weights   06/20/18 2352 06/21/18 0822 06/21/18 1236  Weight: 66.3 kg 66.4 kg 62.9 kg   General: Alert, Awake and Oriented to Time, Place and Person. Appear in mild distress, affect appropriate Eyes: PERRL, Conjunctiva normal ENT: Oral Mucosa clear moist. Neck: difficult to assess  JVD, no Abnormal Mass Or lumps Cardiovascular: S1 and S2 Present, no Murmur, Peripheral Pulses Present Respiratory: normal respiratory effort, Bilateral Air entry equal and Decreased, no use of accessory muscle, Clear to Auscultation, no Crackles, no wheezes Abdomen: Bowel Sound present, Soft and no tenderness, no hernia Skin: no redness, no Rash, no induration Extremities: no Pedal edema, no calf tenderness Neurologic: Grossly no focal neuro deficit. Bilaterally Equal motor strength  Data Reviewed: CBC: Recent Labs  Lab 06/20/18 2042 06/21/18 0909 06/22/18 0244  WBC 6.6 8.0 5.6  NEUTROABS 3.8  --   --   HGB 9.8* 10.6* 11.2*  HCT 34.7* 35.5* 38.6  MCV 77.5* 75.4* 75.4*  PLT 204 232 852   Basic Metabolic Panel: Recent Labs  Lab 06/20/18 2042 06/21/18 0909 06/22/18 0244  NA 140 140 137  K 3.8 4.1 3.7  CL 112* 108 100  CO2 20* 24 27  GLUCOSE 87 132* 101*  BUN 38* 38* 11  CREATININE 2.84* 2.93* 2.23*  CALCIUM 8.3* 8.4* 8.2*  PHOS  --   --  4.3    Liver Function Tests: Recent Labs  Lab 06/20/18 2042 06/22/18 0244  AST 32  --   ALT 16  --   ALKPHOS 76  --   BILITOT 0.3  --   PROT 6.0*  --   ALBUMIN  2.3* 2.2*   No results for input(s): LIPASE, AMYLASE in the last 168 hours. No results for input(s): AMMONIA in the last 168 hours. Coagulation Profile: Recent Labs  Lab 06/20/18 2042 06/21/18 0909 06/22/18 0244  INR 1.76 1.82 2.06   Cardiac Enzymes: Recent Labs  Lab 06/20/18 2042 06/21/18 0229 06/21/18 0909 06/21/18 1417  TROPONINI 0.05* 0.07* 0.07* 0.05*   BNP (last 3 results) No results for input(s): PROBNP in the last 8760 hours. CBG: Recent Labs  Lab 06/21/18 1717 06/21/18 2213 06/22/18 0734 06/22/18 1205 06/22/18 1607  GLUCAP 150* 145* 73 141* 126*   Studies: Dg Chest Port 1 View  Result Date: 06/22/2018 CLINICAL DATA:  Shortness of breath EXAM: PORTABLE CHEST 1 VIEW COMPARISON:  06/20/2018 FINDINGS: Cardiac shadow is enlarged. Dialysis catheter is again seen. Persistent right basilar infiltrate is noted although significantly  improved when compared with the prior exam. No significant interstitial edema is noted. No bony abnormality is seen. IMPRESSION: Significant improved aeration in the right base. Electronically Signed   By: Inez Catalina M.D.   On: 06/22/2018 12:38     Time spent: 35 minutes  Author: Berle Mull, MD [facility] Pager: [phone] 06/22/2018 5:24 PM  If 7PM-7AM, please contact night-coverage at [email or web address], password [facility]

## 2018-06-23 ENCOUNTER — Inpatient Hospital Stay (HOSPITAL_COMMUNITY): Payer: Medicare Other

## 2018-06-23 LAB — CBC
HCT: 38.7 % (ref 36.0–46.0)
Hemoglobin: 11.7 g/dL — ABNORMAL LOW (ref 12.0–15.0)
MCH: 22.4 pg — ABNORMAL LOW (ref 26.0–34.0)
MCHC: 30.2 g/dL (ref 30.0–36.0)
MCV: 74 fL — ABNORMAL LOW (ref 80.0–100.0)
NRBC: 0 % (ref 0.0–0.2)
Platelets: 203 10*3/uL (ref 150–400)
RBC: 5.23 MIL/uL — AB (ref 3.87–5.11)
RDW: 18.5 % — ABNORMAL HIGH (ref 11.5–15.5)
WBC: 5.4 10*3/uL (ref 4.0–10.5)

## 2018-06-23 LAB — RENAL FUNCTION PANEL
ANION GAP: 9 (ref 5–15)
Albumin: 2.2 g/dL — ABNORMAL LOW (ref 3.5–5.0)
BUN: 27 mg/dL — ABNORMAL HIGH (ref 8–23)
CO2: 24 mmol/L (ref 22–32)
Calcium: 8.3 mg/dL — ABNORMAL LOW (ref 8.9–10.3)
Chloride: 103 mmol/L (ref 98–111)
Creatinine, Ser: 3.2 mg/dL — ABNORMAL HIGH (ref 0.44–1.00)
GFR, EST AFRICAN AMERICAN: 17 mL/min — AB (ref >60)
GFR, EST NON AFRICAN AMERICAN: 14 mL/min — AB (ref >60)
Glucose, Bld: 86 mg/dL (ref 70–99)
PHOSPHORUS: 4.4 mg/dL (ref 2.5–4.6)
POTASSIUM: 3.8 mmol/L (ref 3.5–5.1)
Sodium: 136 mmol/L (ref 135–145)

## 2018-06-23 LAB — GLUCOSE, CAPILLARY
GLUCOSE-CAPILLARY: 133 mg/dL — AB (ref 70–99)
GLUCOSE-CAPILLARY: 164 mg/dL — AB (ref 70–99)
Glucose-Capillary: 157 mg/dL — ABNORMAL HIGH (ref 70–99)
Glucose-Capillary: 134 mg/dL — ABNORMAL HIGH (ref 70–99)

## 2018-06-23 LAB — PROTIME-INR
INR: 2.44
PROTHROMBIN TIME: 26.3 s — AB (ref 11.4–15.2)

## 2018-06-23 MED ORDER — TRAMADOL HCL 50 MG PO TABS: 50.0000 mg | ORAL_TABLET | Freq: Two times a day (BID) | ORAL | 0 refills | Status: DC | PRN

## 2018-06-23 MED ORDER — HEPARIN SODIUM (PORCINE) 1000 UNIT/ML IJ SOLN
INTRAMUSCULAR | Status: AC
  Administered 2018-06-23: 2000 [IU] via INTRAVENOUS_CENTRAL
  Filled 2018-06-23: qty 2

## 2018-06-23 MED ORDER — KETOROLAC TROMETHAMINE 30 MG/ML IJ SOLN: 30.0000 mg | Freq: Once | INTRAMUSCULAR | Status: DC

## 2018-06-23 MED ORDER — WARFARIN SODIUM 1 MG PO TABS
1.0000 mg | ORAL_TABLET | Freq: Once | ORAL | Status: AC
  Administered 2018-06-23: 1 mg via ORAL
  Filled 2018-06-23 (×2): qty 1

## 2018-06-23 MED ORDER — METOCLOPRAMIDE HCL 5 MG/ML IJ SOLN
10.0000 mg | Freq: Once | INTRAMUSCULAR | Status: AC
  Administered 2018-06-23: 10 mg via INTRAVENOUS
  Filled 2018-06-23: qty 2

## 2018-06-23 MED ORDER — AMOXICILLIN-POT CLAVULANATE 500-125 MG PO TABS: 1.0000 | ORAL_TABLET | Freq: Two times a day (BID) | ORAL | 0 refills | Status: DC

## 2018-06-23 MED ORDER — HEPARIN SODIUM (PORCINE) 1000 UNIT/ML DIALYSIS
2000.0000 [IU] | Freq: Once | INTRAMUSCULAR | Status: AC
  Administered 2018-06-23: 3400 [IU] via INTRAVENOUS_CENTRAL
  Administered 2018-06-23: 2000 [IU] via INTRAVENOUS_CENTRAL

## 2018-06-23 MED ORDER — HEPARIN SODIUM (PORCINE) 1000 UNIT/ML IJ SOLN
INTRAMUSCULAR | Status: AC
  Administered 2018-06-23: 3400 [IU] via INTRAVENOUS_CENTRAL
  Filled 2018-06-23: qty 4

## 2018-06-23 MED ORDER — DIPHENHYDRAMINE HCL 50 MG/ML IJ SOLN
25.0000 mg | Freq: Once | INTRAMUSCULAR | Status: AC
  Administered 2018-06-23: 25 mg via INTRAVENOUS
  Filled 2018-06-23: qty 1

## 2018-06-23 MED ORDER — HYDROXYZINE HCL 25 MG PO TABS: 25.0000 mg | ORAL_TABLET | Freq: Three times a day (TID) | ORAL | 0 refills | Status: DC | PRN

## 2018-06-23 MED ORDER — TRAMADOL HCL 50 MG PO TABS
ORAL_TABLET | ORAL | Status: AC
  Administered 2018-06-23: 50 mg via ORAL
  Filled 2018-06-23: qty 1

## 2018-06-23 NOTE — Progress Notes (Signed)
ANTICOAGULATION CONSULT NOTE - follow-up Consult  Pharmacy Consult for warfarin Indication: h/o PE  Allergies  Allergen Reactions  . Acetaminophen Nausea And Vomiting    Patient states Acetaminophen and Acetaminophen containing products make her nauseated. She demonstrated this 06/21/18 with nausea followed by emesis.    Patient Measurements: Height: 5\' 1"  (154.9 cm) Weight: 138 lb 10.7 oz (62.9 kg) IBW/kg (Calculated) : 47.8  Vital Signs: Temp: 98.1 F (36.7 C) (10/14 0658) Temp Source: Oral (10/14 0658) BP: 126/85 (10/14 0900) Pulse Rate: 80 (10/14 0900)  Labs: Recent Labs    06/21/18 0229 06/21/18 0909 06/21/18 1417 06/22/18 0244 06/23/18 0239  HGB  --  10.6*  --  11.2* 11.7*  HCT  --  35.5*  --  38.6 38.7  PLT  --  232  --  215 203  LABPROT  --  20.9*  --  23.1* 26.3*  INR  --  1.82  --  2.06 2.44  CREATININE  --  2.93*  --  2.23* 3.20*  TROPONINI 0.07* 0.07* 0.05*  --   --     Estimated Creatinine Clearance: 15.1 mL/min (A) (by C-G formula based on SCr of 3.2 mg/dL (H)).   Medical History: Past Medical History:  Diagnosis Date  . Chronic kidney disease   . Diabetes mellitus without complication ([place])   . ESRD (end stage renal disease) ([place])   . Hypertension   . Oxygen deficiency    2 L at night  . Substance abuse ([place])    has been clean for 4 months     Medications:  No current facility-administered medications on file prior to encounter.    Current Outpatient Medications on File Prior to Encounter  Medication Sig Dispense Refill  . acetaminophen (TYLENOL) 325 MG tablet Take 2 tablets (650 mg total) by mouth every 6 (six) hours as needed for mild pain (or Fever >/= 101).    . Blood Glucose Monitoring Suppl (ACCU-CHEK AVIVA) device Use as instructed 1 each 0  . calcitRIOL (ROCALTROL) 0.25 MCG capsule Take 1 capsule (0.25 mcg total) by mouth every other day. 30 capsule 0  . feeding supplement, GLUCERNA SHAKE, (GLUCERNA SHAKE) LIQD Take 237 mLs by  mouth 2 (two) times daily between meals. (Patient not taking: Reported on 04/26/2018) 60 Can 0  . fluconazole (DIFLUCAN) 200 MG tablet Take 1 tablet (200 mg total) by mouth daily. (Patient not taking: Reported on 06/08/2018) 30 tablet 0  . glucose blood (ACCU-CHEK AVIVA) test strip Use twice per day. 60 each 5  . insulin aspart (NOVOLOG) 100 UNIT/ML injection Inject 0-9 Units into the skin 3 (three) times daily with meals. 10 mL 11  . insulin glargine (LANTUS) 100 UNIT/ML injection Inject 0.05 mLs (5 Units total) into the skin at bedtime. 10 mL 11  . Lancets (ACCU-CHEK SOFT TOUCH) lancets Use twice per day. 60 each 5  . metoprolol tartrate (LOPRESSOR) 25 MG tablet Take 1 tablet (25 mg total) by mouth 2 (two) times daily. 60 tablet 0  . multivitamin (RENA-VIT) TABS tablet Take 1 tablet by mouth at bedtime. (Patient not taking: Reported on 06/08/2018) 30 each 0  . ondansetron (ZOFRAN) 4 MG tablet Take 1 tablet (4 mg total) by mouth every 6 (six) hours as needed for nausea. 20 tablet 0  . warfarin (COUMADIN) 2.5 MG tablet Take 1 tablet (2.5 mg total) by mouth daily at 6 PM. 30 tablet 0  . zolpidem (AMBIEN) 5 MG tablet Take 1 tablet (5 mg total) by  mouth at bedtime as needed for sleep. 30 tablet 0     Assessment: 62 y.o. female admitted with sob, PMH significant for recent PE, ESRD on HD TTS, HTN, DM. Pharmacy consulted to continue patient's warfarin. PTA dose of warfarin 2.5mg  po every day.  INR 2.44 this morning up from 2.06 yesterday, CBC stable. No signs or symptoms of bleeding noted. Azithromycin started 10/13  Goal of Therapy:  INR 2-3 Monitor platelets by anticoagulation protocol: Yes   Plan:  Warfarin 1 mg PO x 1 (lower dose x 1, then f/u INR 10/15) Monitor daily INR, CBC, clinical course, s/sx of bleed, PO intake, DDI   Thank you for allowing [place] to participate in this patients care.   Jens Som, PharmD Please utilize Amion (under [place]) for appropriate number for your unit  pharmacist. 06/23/2018 10:26 AM

## 2018-06-23 NOTE — Progress Notes (Signed)
[facility] Progress Note  Subjective: on HD now  Vitals:   06/23/18 0800 06/23/18 0830 06/23/18 0845 06/23/18 0900  BP: (!) 141/88 133/88 121/70 126/85  Pulse: 72 82 92 80  Resp:      Temp:      TempSrc:      SpO2:      Weight:      Height:        Inpatient medications: . amoxicillin-clavulanate  1 tablet Oral BID  . azithromycin  500 mg Oral Daily  . calcitRIOL  0.25 mcg Oral QODAY  . Chlorhexidine Gluconate Cloth  6 each Topical Q0600  . heparin      . [START ON 06/24/2018] heparin  2,000 Units Dialysis Once in dialysis  . insulin aspart  0-9 Units Subcutaneous TID WC  . metoprolol tartrate  25 mg Oral BID  . Warfarin - Pharmacist Dosing Inpatient   Does not apply q1800    acetaminophen **OR** acetaminophen, butalbital-acetaminophen-caffeine, hydrALAZINE, hydrOXYzine, ondansetron **OR** ondansetron (ZOFRAN) IV, traMADol, zolpidem  Iron/TIBC/Ferritin/ %Sat    Component Value Date/Time   IRON 114 03/22/2018 0750   TIBC 225 (L) 03/22/2018 0750   FERRITIN 958 (H) 03/22/2018 0750   IRONPCTSAT 51 (H) 03/22/2018 0750    Exam: Gen alert, no distres,s much better No jvd or bruits Chest dec'd at bases, no rales RRR no MRG Abd soft ntnd no mass or ascites +bs Ext no LE or UE edema Neuro is alert, Ox 3 , nf R IJ TDC/ LUA avf no bruit   Home meds:  - insulin aspart 0-9 tid ac/ insulin glargine 5u qd  - warfarin 2.5 hs  - MVI/ prns/ vitamins  - metoprolol tartrate 25 bid  - diflucan 200 qd   [facility]  4h  59kg   2/2.5 bath  Hep 2000   R IJ TDC./ avf - mircera 150 last 10/03 - vit D 0.25 ug tiw     Impression/ Plan: 1. SOB/ pulm edema / vol overload - improving w/ fluid removal. Plan HD today get rest of extra volume off.  Should be ok for dc after HD today.  Had discussion w/ patient re: fluid intake restriction.  2. ESRD - HD TTS. R IJ tDC 3. HTN - cont metoprolol 4. Hx BOOP/ autoimmune lung disease - sp course of steroids  this summer 5. DM on insulin 6. Anemia ckd - Hb 9.8, esa when needed 7. MBD ckd - cont meds 8. HTN - BP's have been high likely d/t vol overload, needs to decrease fliud intake and stay on machine at HD.      Kelly Splinter MD [facility] pager [phone]   06/23/2018, 9:32 AM   Recent Labs  Lab 06/22/18 0244 06/23/18 0239  NA 137 136  K 3.7 3.8  CL 100 103  CO2 27 24  GLUCOSE 101* 86  BUN 11 27*  CREATININE 2.23* 3.20*  CALCIUM 8.2* 8.3*  PHOS 4.3 4.4  ALBUMIN 2.2* 2.2*  INR 2.06 2.44   Recent Labs  Lab 06/20/18 2042  AST 32  ALT 16  ALKPHOS 76  BILITOT 0.3  PROT 6.0*   Recent Labs  Lab 06/20/18 2042  06/22/18 0244 06/23/18 0239  WBC 6.6   < > 5.6 5.4  NEUTROABS 3.8  --   --   --   HGB 9.8*   < > 11.2* 11.7*  HCT 34.7*   < > 38.6 38.7  MCV 77.5*   < >  75.4* 74.0*  PLT 204   < > 215 203   < > = values in this interval not displayed.

## 2018-06-23 NOTE — Progress Notes (Addendum)
[facility] Progress Note  Patient: Rhonda Lynch:[id number]   PCP: Clent Demark, PA-C DOB: 08/30/1956   DOA: 06/20/2018   DOS: 06/23/2018   Date of Service: the patient was seen and examined on 06/23/2018  Brief hospital course: Pt. with PMH of ESRD on HD, type II DM, HTN, ANCA vasculitis, PE on Coumadin; admitted on 06/20/2018, presented with complaint of shortness of breath, was found to have acute volume overload secondary to missing hemodialysis. Currently further plan is continue current care.  Subjective: Continues to complain of headache.  Also complains about having bilateral hand cramps.  Does not report any nausea right now.  No vomiting yesterday.  Passing gas.  Had a good appetite.  Assessment and Plan: 1.  Acute hypoxic respiratory failure. Acute on chronic diastolic CHF. ESRD on HD TTS Missed hemodialysis Hypertensive urgency Presented with shortness of breath and headache.  Blood pressure was significantly elevated in the ED.  Patient missed one session of hemodialysis due to not feeling well outpatient. Significantly volume overloaded with weight up from her dry weight. Nephrology was consulted patient underwent urgent HD yesterday and will get another acute HD today. Blood pressure significantly better after HD continue current medications.  2.  Headache. Patient is reportedly drowsy.  Although the time of my evaluation patient was awake and alert and was able to answer all questions. Bilateral equal strength.  No tingling or numbness.  Reports bilateral hand cramping. CT head unremarkable. Patient will not take anything with Tylenol in it. Avoiding NSAIDs in patients with ESRD. Avoiding any psychotropic medication given her drowsy status. We will get CT head to rule out any acute abnormality. Recommended patient to try Tylenol with pretreatment with Zofran.  3.  Abdominal pain. Patient reported abdominal pain CT abdomen on admission was  unremarkable. Pain has currently resolved. Her nausea and vomiting secondary narcotics that she got for headache. Discontinue IV narcotics. Patient reports that she had some medicine which had Tylenol in it a couple of days ago which made her sick but did not have any problem yesterday.  When I informed her that she has been receiving Fioricet for headache she become confrontational. Currently due to her sleepiness unable to provide any other medication.  CT abdomen pelvis on admission unremarkable still do not think the patient has any organic etiology for her pain.  4.  Type 2 diabetes mellitus. Blood sugars elevated. Continue sliding scale insulin.  5.  Anemia of chronic kidney disease. H&H stable.  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  7. Elevated troponin. Demand ischemia. No concern for ACS.   Diet: renal diet DVT Prophylaxis: subcutaneous Heparin  Advance goals of care discussion: full code  Family Communication: no family was present at bedside, at the time of interview.   Disposition:  Discharge to home tomorrow after HD.  Discussed with nephrology regarding patient's overnight stay.  Consultants: nephrology Procedures: HD  Scheduled Meds: . amoxicillin-clavulanate  1 tablet Oral BID  . azithromycin  500 mg Oral Daily  . calcitRIOL  0.25 mcg Oral QODAY  . Chlorhexidine Gluconate Cloth  6 each Topical Q0600  . insulin aspart  0-9 Units Subcutaneous TID WC  . metoprolol tartrate  25 mg Oral BID  . Warfarin - Pharmacist Dosing Inpatient   Does not apply q1800   Continuous Infusions: PRN Meds: acetaminophen **OR** acetaminophen, hydrALAZINE, hydrOXYzine, ondansetron **OR** ondansetron (ZOFRAN) IV, zolpidem Antibiotics: Anti-infectives (From admission, onward)   Start     Dose/Rate Route Frequency Ordered Stop  06/23/18 0000  amoxicillin-clavulanate (AUGMENTIN) 500-125 MG tablet     1 tablet Oral 2 times daily 06/23/18 1338 06/24/18 2359   06/22/18 1000   azithromycin (ZITHROMAX) tablet 500 mg     500 mg Oral Daily 06/22/18 0942     06/22/18 1000  amoxicillin-clavulanate (AUGMENTIN) 500-125 MG per tablet 500 mg     1 tablet Oral 2 times daily 06/22/18 0942     06/21/18 1800  ceFEPIme (MAXIPIME) 2 g in sodium chloride 0.9 % 100 mL IVPB  Status:  Discontinued     2 g 200 mL/hr over 30 Minutes Intravenous Every T-Th-Sa (1800) 06/20/18 2214 06/22/18 0941   06/21/18 1200  vancomycin (VANCOCIN) IVPB 750 mg/150 ml premix  Status:  Discontinued     750 mg 150 mL/hr over 60 Minutes Intravenous Every T-Th-Sa (Hemodialysis) 06/20/18 2214 06/22/18 0941   06/20/18 2200  ceFEPIme (MAXIPIME) 2 g in sodium chloride 0.9 % 100 mL IVPB     2 g 200 mL/hr over 30 Minutes Intravenous  Once 06/20/18 2158 06/20/18 2310   06/20/18 2200  vancomycin (VANCOCIN) IVPB 1000 mg/200 mL premix     1,000 mg 200 mL/hr over 60 Minutes Intravenous  Once 06/20/18 2158 06/21/18 0200       Objective: Physical Exam: Vitals:   06/23/18 1000 06/23/18 1030 06/23/18 1100 06/23/18 1746  BP: 98/71 (!) 127/99 112/84 118/78  Pulse: 89 81 81 72  Resp:   14   Temp:   (!) 97.4 F (36.3 C) 100.1 F (37.8 C)  TempSrc:   Oral Oral  SpO2:    100%  Weight:      Height:        Intake/Output Summary (Last 24 hours) at 06/23/2018 1816 Last data filed at 06/23/2018 1100 Gross per 24 hour  Intake 120 ml  Output 2865 ml  Net -2745 ml   Filed Weights   06/21/18 0822 06/21/18 1236 06/23/18 0649  Weight: 66.4 kg 62.9 kg 62.9 kg   General: Alert, Awake and Oriented to Time, Place and Person. Appear in mild distress, affect appropriate Eyes: PERRL, Conjunctiva normal ENT: Oral Mucosa clear moist. Neck: difficult to assess  JVD, no Abnormal Mass Or lumps Cardiovascular: S1 and S2 Present, no Murmur, Peripheral Pulses Present Respiratory: normal respiratory effort, Bilateral Air entry equal and Decreased, no use of accessory muscle, Clear to Auscultation, no Crackles, no  wheezes Abdomen: Bowel Sound present, Soft and no tenderness, no hernia Skin: no redness, no Rash, no induration Extremities: no Pedal edema, no calf tenderness Neurologic: Grossly no focal neuro deficit. Bilaterally Equal motor strength  Data Reviewed: CBC: Recent Labs  Lab 06/20/18 2042 06/21/18 0909 06/22/18 0244 06/23/18 0239  WBC 6.6 8.0 5.6 5.4  NEUTROABS 3.8  --   --   --   HGB 9.8* 10.6* 11.2* 11.7*  HCT 34.7* 35.5* 38.6 38.7  MCV 77.5* 75.4* 75.4* 74.0*  PLT 204 232 215 277   Basic Metabolic Panel: Recent Labs  Lab 06/20/18 2042 06/21/18 0909 06/22/18 0244 06/23/18 0239  NA 140 140 137 136  K 3.8 4.1 3.7 3.8  CL 112* 108 100 103  CO2 20* 24 27 24   GLUCOSE 87 132* 101* 86  BUN 38* 38* 11 27*  CREATININE 2.84* 2.93* 2.23* 3.20*  CALCIUM 8.3* 8.4* 8.2* 8.3*  PHOS  --   --  4.3 4.4    Liver Function Tests: Recent Labs  Lab 06/20/18 2042 06/22/18 0244 06/23/18 0239  AST 32  --   --  ALT 16  --   --   ALKPHOS 76  --   --   BILITOT 0.3  --   --   PROT 6.0*  --   --   ALBUMIN 2.3* 2.2* 2.2*   No results for input(s): LIPASE, AMYLASE in the last 168 hours. No results for input(s): AMMONIA in the last 168 hours. Coagulation Profile: Recent Labs  Lab 06/20/18 2042 06/21/18 0909 06/22/18 0244 06/23/18 0239  INR 1.76 1.82 2.06 2.44   Cardiac Enzymes: Recent Labs  Lab 06/20/18 2042 06/21/18 0229 06/21/18 0909 06/21/18 1417  TROPONINI 0.05* 0.07* 0.07* 0.05*   BNP (last 3 results) No results for input(s): PROBNP in the last 8760 hours. CBG: Recent Labs  Lab 06/22/18 1607 06/22/18 2216 06/23/18 1121 06/23/18 1157 06/23/18 1743  GLUCAP 126* 106* 133* 157* 164*   Studies: No results found.   Time spent: 35 minutes  Author: Berle Mull, MD [facility] Pager: [phone] 06/23/2018 6:16 PM  If 7PM-7AM, please contact night-coverage at [email or web address], password [facility]

## 2018-06-23 NOTE — Evaluation (Signed)
Physical Therapy Evaluation Patient Details Name: Rhonda Lynch MRN: [id number] DOB: [birth year] Today's Date: 06/23/2018   History of Present Illness  Pt. with PMH of ESRD on HD, type II DM, HTN, ANCA vasculitis, PE on Coumadin; admitted on 06/20/2018, presented with complaint of shortness of breath, was found to have acute volume overload secondary to missing hemodialysis.  HTN urgency.    Clinical Impression  Pt admitted with above diagnosis. Pt currently with functional limitations due to the deficits listed below (see PT Problem List). Very limited PT eval as pt was too lethargic to participate.  Pt did confirm questions about home.  Pt could not hold UE up and not following commands consistently.  Unable to assist with coming to EOb.  BP was 101/67.  Nursing made aware of mobility today and how PT was unable to accurately assess mobility. Will follow up tomorrow.   Pt will benefit from skilled PT to increase their independence and safety with mobility to allow discharge to the venue listed below.      Follow Up Recommendations Other (comment)(TBA)    Equipment Recommendations  Other (comment)(TBA)    Recommendations for Other Services       Precautions / Restrictions Precautions Precautions: Fall      Mobility  Bed Mobility Overal bed mobility: Needs Assistance Bed Mobility: Supine to Sit     Supine to sit: Max assist     General bed mobility comments: Attempts to keep pt aroused unsuccessful.  Pt too lethargic to perform exercises or sit EOB.  BP 101/67, 72 bpm.  Pt O2 on 3.5: was 96%.  Nursing made aware of poor arousal.    Transfers                    Ambulation/Gait                Stairs            Wheelchair Mobility    Modified Rankin (Stroke Patients Only)       Balance                                             Pertinent Vitals/Pain Pain Assessment: No/denies pain    Home Living Family/patient expects to  be discharged to:: Private residence Living Arrangements: Non-relatives/Friends Available Help at Discharge: Family;Available PRN/intermittently Type of Home: Apartment Home Access: Stairs to enter Entrance Stairs-Rails: None Entrance Stairs-Number of Steps: 3 flights (3rd floor apt, no elevator) Home Layout: One level Home Equipment: None      Prior Function Level of Independence: Independent         Comments: Reports no driving or working, son does grocery shopping     Hand Dominance   Dominant Hand: Right    Extremity/Trunk Assessment   Upper Extremity Assessment Upper Extremity Assessment: Defer to OT evaluation    Lower Extremity Assessment Lower Extremity Assessment: Generalized weakness       Communication   Communication: Expressive difficulties  Cognition Arousal/Alertness: Lethargic Behavior During Therapy: Flat affect Overall Cognitive Status: Difficult to assess                                        General Comments      Exercises  Assessment/Plan    PT Assessment Patient needs continued PT services  PT Problem List Decreased activity tolerance;Decreased balance;Decreased mobility;Decreased knowledge of use of DME;Decreased safety awareness;Decreased knowledge of precautions       PT Treatment Interventions DME instruction;Gait training;Functional mobility training;Therapeutic activities;Therapeutic exercise;Balance training;Stair training;Patient/family education    PT Goals (Current goals can be found in the Care Plan section)  Acute Rehab PT Goals Patient Stated Goal: unable to state PT Goal Formulation: Patient unable to participate in goal setting Time For Goal Achievement: 07/07/18 Potential to Achieve Goals: Good    Frequency Min 3X/week   Barriers to discharge        Co-evaluation               AM-PAC PT "6 Clicks" Daily Activity  Outcome Measure Difficulty turning over in bed (including adjusting  bedclothes, sheets and blankets)?: Unable Difficulty moving from lying on back to sitting on the side of the bed? : Unable Difficulty sitting down on and standing up from a chair with arms (e.g., wheelchair, bedside commode, etc,.)?: Unable Help needed moving to and from a bed to chair (including a wheelchair)?: Total Help needed walking in hospital room?: Total Help needed climbing 3-5 steps with a railing? : Total 6 Click Score: 6    End of Session   Activity Tolerance: Patient limited by lethargy Patient left: in bed;with call bell/phone within reach Nurse Communication: Mobility status;Need for lift equipment PT Visit Diagnosis: Muscle weakness (generalized) (M62.81)    Time: 7357-8978 PT Time Calculation (min) (ACUTE ONLY): 10 min   Charges:   PT Evaluation $PT Eval Low Complexity: [place] Robin Petrakis,PT Acute Rehabilitation Services Pager:  [phone]  Office:  [place] 06/23/2018, 4:29 PM

## 2018-06-24 LAB — RENAL FUNCTION PANEL
ALBUMIN: 2.6 g/dL — AB (ref 3.5–5.0)
ANION GAP: 15 (ref 5–15)
BUN: 20 mg/dL (ref 8–23)
CALCIUM: 9 mg/dL (ref 8.9–10.3)
CO2: 23 mmol/L (ref 22–32)
CREATININE: 3.39 mg/dL — AB (ref 0.44–1.00)
Chloride: 97 mmol/L — ABNORMAL LOW (ref 98–111)
GFR calc non Af Amer: 13 mL/min — ABNORMAL LOW (ref >60)
GFR, EST AFRICAN AMERICAN: 15 mL/min — AB (ref >60)
Glucose, Bld: 103 mg/dL — ABNORMAL HIGH (ref 70–99)
PHOSPHORUS: 3.8 mg/dL (ref 2.5–4.6)
Potassium: 3.9 mmol/L (ref 3.5–5.1)
SODIUM: 135 mmol/L (ref 135–145)

## 2018-06-24 LAB — CBC
HCT: 44.9 % (ref 36.0–46.0)
HEMOGLOBIN: 13.4 g/dL (ref 12.0–15.0)
MCH: 21.8 pg — ABNORMAL LOW (ref 26.0–34.0)
MCHC: 29.8 g/dL — ABNORMAL LOW (ref 30.0–36.0)
MCV: 73 fL — ABNORMAL LOW (ref 80.0–100.0)
NRBC: 0 % (ref 0.0–0.2)
Platelets: 227 10*3/uL (ref 150–400)
RBC: 6.15 MIL/uL — AB (ref 3.87–5.11)
RDW: 19.2 % — ABNORMAL HIGH (ref 11.5–15.5)
WBC: 8.3 10*3/uL (ref 4.0–10.5)

## 2018-06-24 LAB — PROTIME-INR
INR: 2.33
Prothrombin Time: 25.4 seconds — ABNORMAL HIGH (ref 11.4–15.2)

## 2018-06-24 LAB — GLUCOSE, CAPILLARY
GLUCOSE-CAPILLARY: 149 mg/dL — AB (ref 70–99)
Glucose-Capillary: 167 mg/dL — ABNORMAL HIGH (ref 70–99)

## 2018-06-24 MED ORDER — WARFARIN SODIUM 2.5 MG PO TABS: 2.5000 mg | ORAL_TABLET | Freq: Every day | ORAL | Status: DC

## 2018-06-24 MED ORDER — FLUTICASONE PROPIONATE 50 MCG/ACT NA SUSP: 2.0000 | Freq: Every day | NASAL | 0 refills | Status: DC

## 2018-06-24 MED ORDER — SALINE SPRAY 0.65 % NA SOLN: 1.0000 | NASAL | 0 refills | Status: DC | PRN

## 2018-06-24 MED ORDER — FLUTICASONE PROPIONATE 50 MCG/ACT NA SUSP
2.0000 | Freq: Every day | NASAL | Status: DC
  Administered 2018-06-24: 2 via NASAL
  Filled 2018-06-24: qty 16

## 2018-06-24 MED ORDER — SALINE SPRAY 0.65 % NA SOLN
1.0000 | NASAL | Status: DC | PRN
  Filled 2018-06-24: qty 44

## 2018-06-24 NOTE — Care Management Note (Signed)
Case Management Note  Patient Details  Name: Rhonda Lynch MRN: [id number] Date of Birth: 1955/10/02  Subjective/Objective:   From home , ESRD T,TH,SAT, missed HD, has htn urgency, HA, volume overload, had dialysis yesterday and today. Transitional care pharmacy will do medications at dc.  Plan for dc today.                 Action/Plan: DC home, no needs.  Expected Discharge Date:  06/24/18               Expected Discharge Plan:  Home/Self Care  In-House Referral:     Discharge planning Services  CM Consult  Post Acute Care Choice:    Choice offered to:     DME Arranged:    DME Agency:     HH Arranged:    HH Agency:     Status of Service:  Completed, signed off  If discussed at [employer] of Stay Meetings, dates discussed:    Additional Comments:  Zenon Mayo, RN 06/24/2018, 1:57 PM

## 2018-06-24 NOTE — Progress Notes (Signed)
[facility] Progress Note  Subjective: on HD now  Vitals:   06/23/18 2114 06/24/18 0620 06/24/18 0818 06/24/18 0839  BP: 107/70  (!) 122/94   Pulse: 70  86   Resp: 18  20   Temp: 98.3 F (36.8 C)  98.3 F (36.8 C)   TempSrc: Oral  Oral   SpO2: 98%  100%   Weight:  60.8 kg  60.1 kg  Height:        Inpatient medications: . amoxicillin-clavulanate  1 tablet Oral BID  . calcitRIOL  0.25 mcg Oral QODAY  . Chlorhexidine Gluconate Cloth  6 each Topical Q0600  . fluticasone  2 spray Each Nare Daily  . insulin aspart  0-9 Units Subcutaneous TID WC  . metoprolol tartrate  25 mg Oral BID  . warfarin  2.5 mg Oral q1800  . Warfarin - Pharmacist Dosing Inpatient   Does not apply q1800    acetaminophen **OR** acetaminophen, hydrALAZINE, hydrOXYzine, ondansetron **OR** ondansetron (ZOFRAN) IV, sodium chloride, zolpidem  Iron/TIBC/Ferritin/ %Sat    Component Value Date/Time   IRON 114 03/22/2018 0750   TIBC 225 (L) 03/22/2018 0750   FERRITIN 958 (H) 03/22/2018 0750   IRONPCTSAT 51 (H) 03/22/2018 0750    Exam: Gen alert, no distres,s much better No jvd or bruits Chest dec'd at bases, no rales RRR no MRG Abd soft ntnd no mass or ascites +bs Ext no LE or UE edema Neuro is alert, Ox 3 , nf R IJ TDC/ LUA avf no bruit   Home meds:  - insulin aspart 0-9 tid ac/ insulin glargine 5u qd  - warfarin 2.5 hs  - MVI/ prns/ vitamins  - metoprolol tartrate 25 bid  - diflucan 200 qd   Dialysis: [facility]  4h  59kg   2/2.5 bath  Hep 2000   R IJ TDC./ avf - mircera 150 last 10/03 - vit D 0.25 ug tiw     Impression/ Plan: 1. SOB/ pulm edema / vol overload - resolved w/ HD x 2.  She is 1kg over edw and OK for dc today, to resume HD on Thursday at OP center.  2. ESRD - HD TTS. R IJ tDC 3. HTN - cont metoprolol 4. Hx BOOP/ autoimmune lung disease - sp course of steroids this summer 5. DM on insulin 6. Anemia ckd - Hb 9.8, esa when needed 7. MBD ckd - cont  meds 8. HTN - BP's high due to high fluid intake, needs to cut back on fluid input   Kelly Splinter MD [place] pager [phone]   06/24/2018, 1:06 PM   Recent Labs  Lab 06/23/18 0239 06/24/18 0421  NA 136 135  K 3.8 3.9  CL 103 97*  CO2 24 23  GLUCOSE 86 103*  BUN 27* 20  CREATININE 3.20* 3.39*  CALCIUM 8.3* 9.0  PHOS 4.4 3.8  ALBUMIN 2.2* 2.6*  INR 2.44 2.33   Recent Labs  Lab 06/20/18 2042  AST 32  ALT 16  ALKPHOS 76  BILITOT 0.3  PROT 6.0*   Recent Labs  Lab 06/20/18 2042  06/23/18 0239 06/24/18 0421  WBC 6.6   < > 5.4 8.3  NEUTROABS 3.8  --   --   --   HGB 9.8*   < > 11.7* 13.4  HCT 34.7*   < > 38.7 44.9  MCV 77.5*   < > 74.0* 73.0*  PLT 204   < > 203 227   < > = values  in this interval not displayed.

## 2018-06-24 NOTE — Progress Notes (Signed)
Pt is being discharged to home/ self-care per physician. Medication changes have been reviewed, pt has no further questions at this time. All of pt.s belongings have been verified returned, clothing and medications left with security upon arrival.  Pt will be leaving via wheelchair.

## 2018-06-24 NOTE — Progress Notes (Signed)
Physical Therapy Treatment Patient Details Name: Rhonda Lynch MRN: [id number] DOB: [birth year] Today's Date: 06/24/2018    History of Present Illness Pt. with PMH of ESRD on HD, type II DM, HTN, ANCA vasculitis, PE on Coumadin; admitted on 06/20/2018, presented with complaint of shortness of breath, was found to have acute volume overload secondary to missing hemodialysis.  HTN urgency.      PT Comments    Pt presents with generalized weakness due to acute respiratory failure with hypoxia. Pt was able to transfer and amb with min guard and perform therapeutic exercises. Pt requires no follow up PT upon d/c.   Follow Up Recommendations  No PT follow up     Equipment Recommendations  None recommended by PT    Recommendations for Other Services       Precautions / Restrictions Restrictions Weight Bearing Restrictions: No    Mobility  Bed Mobility Overal bed mobility: Independent Bed Mobility: Supine to Sit     Supine to sit: Independent     General bed mobility comments: Pt was able move from supine to sit without assistance.  Transfers Overall transfer level: Independent Equipment used: None                Ambulation/Gait Ambulation/Gait assistance: Min guard Gait Distance (Feet): 200 Feet Assistive device: None Gait Pattern/deviations: Step-through pattern;Decreased stride length     General Gait Details: Pt was able to amb with min guard down the hall.    Stairs             Wheelchair Mobility    Modified Rankin (Stroke Patients Only)       Balance Overall balance assessment: Independent                                          Cognition Arousal/Alertness: Awake/alert Behavior During Therapy: WFL for tasks assessed/performed Overall Cognitive Status: Within Functional Limits for tasks assessed                                        Exercises Total Joint Exercises Marching in Standing: 10  reps;Both;AROM Other Exercises Other Exercises: Sit to stand 2x5 at EOB    General Comments        Pertinent Vitals/Pain Pain Assessment: 0-10 Pain Score: 8  Pain Location: Pt reports 8/10 pain with all activities. Pain Intervention(s): Monitored during session    Home Living                      Prior Function            PT Goals (current goals can now be found in the care plan section) Progress towards PT goals: Progressing toward goals    Frequency    Min 3X/week      PT Plan Discharge plan needs to be updated    Co-evaluation              AM-PAC PT "6 Clicks" Daily Activity  Outcome Measure  Difficulty turning over in bed (including adjusting bedclothes, sheets and blankets)?: None Difficulty moving from lying on back to sitting on the side of the bed? : None Difficulty sitting down on and standing up from a chair with arms (e.g., wheelchair, bedside commode, etc,.)?: None Help needed moving  to and from a bed to chair (including a wheelchair)?: None Help needed walking in hospital room?: A Little Help needed climbing 3-5 steps with a railing? : A Little 6 Click Score: 22    End of Session Equipment Utilized During Treatment: Gait belt Activity Tolerance: Patient tolerated treatment well Patient left: in bed;with call bell/phone within reach   PT Visit Diagnosis: Muscle weakness (generalized) (M62.81)     Time: 9381-0175 PT Time Calculation (min) (ACUTE ONLY): 22 min  Charges:  $Gait Training: 8-22 mins                     [employer] SPT 06/24/2018    Rolland Porter 06/24/2018, 4:58 PM

## 2018-06-24 NOTE — Progress Notes (Signed)
ANTICOAGULATION CONSULT NOTE - follow-up Consult  Pharmacy Consult for warfarin Indication: h/o PE  Allergies  Allergen Reactions  . Acetaminophen Nausea And Vomiting    Patient states Acetaminophen and Acetaminophen containing products make her nauseated. She demonstrated this 06/21/18 with nausea followed by emesis.    Patient Measurements: Height: 5\' 1"  (154.9 cm) Weight: 132 lb 7.9 oz (60.1 kg) IBW/kg (Calculated) : 47.8  Vital Signs: Temp: 98.3 F (36.8 C) (10/15 0818) Temp Source: Oral (10/15 0818) BP: 122/94 (10/15 0818) Pulse Rate: 86 (10/15 0818)  Labs: Recent Labs    06/21/18 1417  06/22/18 0244 06/23/18 0239 06/24/18 0421  HGB  --    < > 11.2* 11.7* 13.4  HCT  --   --  38.6 38.7 44.9  PLT  --   --  215 203 227  LABPROT  --   --  23.1* 26.3* 25.4*  INR  --   --  2.06 2.44 2.33  CREATININE  --   --  2.23* 3.20* 3.39*  TROPONINI 0.05*  --   --   --   --    < > = values in this interval not displayed.    Estimated Creatinine Clearance: 13.9 mL/min (A) (by C-G formula based on SCr of 3.39 mg/dL (H)).   Medical History: Past Medical History:  Diagnosis Date  . Chronic kidney disease   . Diabetes mellitus without complication ([place])   . ESRD (end stage renal disease) ([place])   . Hypertension   . Oxygen deficiency    2 L at night  . Substance abuse ([place])    has been clean for 4 months     Medications:  No current facility-administered medications on file prior to encounter.    Current Outpatient Medications on File Prior to Encounter  Medication Sig Dispense Refill  . acetaminophen (TYLENOL) 325 MG tablet Take 2 tablets (650 mg total) by mouth every 6 (six) hours as needed for mild pain (or Fever >/= 101).    . Blood Glucose Monitoring Suppl (ACCU-CHEK AVIVA) device Use as instructed 1 each 0  . calcitRIOL (ROCALTROL) 0.25 MCG capsule Take 1 capsule (0.25 mcg total) by mouth every other day. 30 capsule 0  . feeding supplement, GLUCERNA SHAKE, (GLUCERNA  SHAKE) LIQD Take 237 mLs by mouth 2 (two) times daily between meals. (Patient not taking: Reported on 04/26/2018) 60 Can 0  . fluconazole (DIFLUCAN) 200 MG tablet Take 1 tablet (200 mg total) by mouth daily. (Patient not taking: Reported on 06/08/2018) 30 tablet 0  . glucose blood (ACCU-CHEK AVIVA) test strip Use twice per day. 60 each 5  . insulin aspart (NOVOLOG) 100 UNIT/ML injection Inject 0-9 Units into the skin 3 (three) times daily with meals. 10 mL 11  . insulin glargine (LANTUS) 100 UNIT/ML injection Inject 0.05 mLs (5 Units total) into the skin at bedtime. 10 mL 11  . Lancets (ACCU-CHEK SOFT TOUCH) lancets Use twice per day. 60 each 5  . metoprolol tartrate (LOPRESSOR) 25 MG tablet Take 1 tablet (25 mg total) by mouth 2 (two) times daily. 60 tablet 0  . multivitamin (RENA-VIT) TABS tablet Take 1 tablet by mouth at bedtime. (Patient not taking: Reported on 06/08/2018) 30 each 0  . ondansetron (ZOFRAN) 4 MG tablet Take 1 tablet (4 mg total) by mouth every 6 (six) hours as needed for nausea. 20 tablet 0  . warfarin (COUMADIN) 2.5 MG tablet Take 1 tablet (2.5 mg total) by mouth daily at 6 PM. 30 tablet  0  . zolpidem (AMBIEN) 5 MG tablet Take 1 tablet (5 mg total) by mouth at bedtime as needed for sleep. 30 tablet 0     Assessment: 62 y.o. female admitted with sob, PMH significant for recent PE, ESRD on HD TTS, HTN, DM. Pharmacy consulted to continue patient's warfarin. PTA dose of warfarin 2.5mg  po every day.  INR looks to be stable now so we will resume her home dose of coumadin. INR 2.33 today.  Goal of Therapy:  INR 2-3 Monitor platelets by anticoagulation protocol: Yes   Plan:  Coumadin 2.5mg  PO qday Monitor daily INR, CBC, clinical course, s/sx of bleed, PO intake, DDI  Onnie Boer, PharmD, [place], AAHIVP, CPP Infectious Disease Pharmacist Pager: [phone] 06/24/2018 9:33 AM

## 2018-06-25 LAB — CULTURE, BLOOD (ROUTINE X 2)
CULTURE: NO GROWTH
SPECIAL REQUESTS: ADEQUATE

## 2018-06-25 NOTE — Discharge Summary (Signed)
[facility] Discharge Summary   Patient: Rhonda Lynch EXN:[id number]   PCP: Clent Demark, PA-C DOB: 1956-04-13   Date of admission: 06/20/2018   Date of discharge: 06/24/2018     Discharge Diagnoses:  Principal Problem:   Acute respiratory failure with hypoxia ([place]) Active Problems:   ESRD (end stage renal disease) ([place])   DM (diabetes mellitus), type 2 with renal complications ([place])   HCAP (healthcare-associated pneumonia)   Admitted From: home Disposition:  home  Recommendations for Outpatient Follow-up:  1. Please follow up with HD ac recommended   Follow-up Information    Clent Demark, PA-C. Schedule an appointment as soon as possible for a visit in 1 week(s).   Specialty:  Physician Assistant Contact information: [address] [place] [address] [phone]        INR check with PCP. Schedule an appointment as soon as possible for a visit on 06/26/2018.          Diet recommendation: renal diet  Activity: The patient is advised to gradually reintroduce usual activities.  Discharge Condition: good  Code Status: full code  History of present illness: As per the H and P dictated on admission, "Rhonda Lynch is a 62 y.o. female with history of ESRD on hemodialysis, ankle associated vasculitis, pulmonary embolism on Coumadin, diabetes mellitus type 2 presents to the ER with multiple complaints including shortness of breath headache abdominal discomfort.  Patient states she has not been doing well for last 2 weeks with nonproductive cough headaches which is global with no focal deficits and has been doing some abdominal discomfort.  Denies any nausea vomiting or diarrhea.  Has been having nonproductive cough.  Missed her last dialysis session due to being unwell.  Shortness of breath worsened so she called the EMS."  Hospital Course:  Summary of her active problems in the hospital is as following. 1.  Acute hypoxic respiratory  failure. Acute on chronic diastolic CHF. ESRD on HD TTS Missed hemodialysis Hypertensive urgency Presented with shortness of breath and headache.  Blood pressure was significantly elevated in the ED.  Patient missed one session of hemodialysis due to not feeling well outpatient. Significantly volume overloaded with weight up from her dry weight. Nephrology was consulted patient underwent serial HD and will ontinue her HD from Thursday. Blood pressure significantly better after HD continue current medications.  2.  Headache. Acute toxic and metabolic encephalopathy Patient is reportedly drowsy.  Although the time of my evaluation patient was awake and alert and was able to answer all questions. Bilateral equal strength.  No tingling or numbness.  Reports bilateral hand cramping. CT head unremarkable. Patient will not take anything with Tylenol in it. Avoiding NSAIDs in patients with ESRD. Avoiding any psychotropic medication given her drowsy status. CT head ruled out any acute abnormality. Recommended patient to try Tylenol with pretreatment with Zofran.  3.  Abdominal pain. Patient reported abdominal pain CT abdomen on admission was unremarkable. Pain has currently resolved. Her nausea and vomiting secondary narcotics that she got for headache. Discontinue IV narcotics. CT abdomen pelvis on admission unremarkable, do not think the patient has any organic etiology for her pain.  4.  Type 2 diabetes mellitus. unControlled, with hyperglycemia with renal complication on home insulin Blood sugars elevated.  5.  Anemia of chronic kidney disease. H&H stable.  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  7. Elevated troponin. Demand ischemia. No concern for ACS.  All other chronic medical condition were stable during the  hospitalization.  Patient was seen by physical therapy, who recommended no PT follow up needed  On the day of the discharge the patient's vitals were stable, and  no other acute medical condition were reported by patient. the patient was felt safe to be discharge at home.  Consultants: nephrology Procedures: HD  DISCHARGE MEDICATION: Allergies as of 06/24/2018      Reactions   Acetaminophen Nausea And Vomiting   Patient states Acetaminophen and Acetaminophen containing products make her nauseated. She demonstrated this 06/21/18 with nausea followed by emesis.      Medication List    STOP taking these medications   fluconazole 200 MG tablet Commonly known as:  DIFLUCAN     TAKE these medications   ACCU-CHEK AVIVA device Use as instructed   accu-chek soft touch lancets Use twice per day.   acetaminophen 325 MG tablet Commonly known as:  TYLENOL Take 2 tablets (650 mg total) by mouth every 6 (six) hours as needed for mild pain (or Fever >/= 101).   calcitRIOL 0.25 MCG capsule Commonly known as:  ROCALTROL Take 1 capsule (0.25 mcg total) by mouth every other day.   feeding supplement (GLUCERNA SHAKE) Liqd Take 237 mLs by mouth 2 (two) times daily between meals.   fluticasone 50 MCG/ACT nasal spray Commonly known as:  FLONASE Place 2 sprays into both nostrils daily.   glucose blood test strip Use twice per day.   hydrOXYzine 25 MG tablet Commonly known as:  ATARAX/VISTARIL Take 1 tablet (25 mg total) by mouth 3 (three) times daily as needed for itching.   insulin aspart 100 UNIT/ML injection Commonly known as:  novoLOG Inject 0-9 Units into the skin 3 (three) times daily with meals.   insulin glargine 100 UNIT/ML injection Commonly known as:  LANTUS Inject 0.05 mLs (5 Units total) into the skin at bedtime.   metoprolol tartrate 25 MG tablet Commonly known as:  LOPRESSOR Take 1 tablet (25 mg total) by mouth 2 (two) times daily.   multivitamin Tabs tablet Take 1 tablet by mouth at bedtime.   ondansetron 4 MG tablet Commonly known as:  ZOFRAN Take 1 tablet (4 mg total) by mouth every 6 (six) hours as needed for  nausea.   sodium chloride 0.65 % Soln nasal spray Commonly known as:  OCEAN Place 1 spray into both nostrils as needed for congestion.   warfarin 2.5 MG tablet Commonly known as:  COUMADIN Take 1 tablet (2.5 mg total) by mouth daily at 6 PM.   zolpidem 5 MG tablet Commonly known as:  AMBIEN Take 1 tablet (5 mg total) by mouth at bedtime as needed for sleep.      Allergies  Allergen Reactions  . Acetaminophen Nausea And Vomiting    Patient states Acetaminophen and Acetaminophen containing products make her nauseated. She demonstrated this 06/21/18 with nausea followed by emesis.   Discharge Instructions    Diet renal [id number]   Complete by:  As directed    Discharge instructions   Complete by:  As directed    It is important that you read following instructions as well as go over your medication list with RN to help you understand your care after this hospitalization.  Discharge Instructions: Please follow-up with PCP in one week  Please request your primary care physician to go over all Hospital Tests and Procedure/Radiological results at the follow up,  Please get all Hospital records sent to your PCP by signing hospital release before you go home.  Do not take more than prescribed Pain, Sleep and Anxiety Medications. You were cared for by a hospitalist during your hospital stay. If you have any questions about your discharge medications or the care you received while you were in the hospital after you are discharged, you can call the unit and ask to speak with the hospitalist on call if the hospitalist that took care of you is not available.  Once you are discharged, your primary care physician will handle any further medical issues. Please note that NO REFILLS for any discharge medications will be authorized once you are discharged, as it is imperative that you return to your primary care physician (or establish a relationship with a primary care physician if you do not  have one) for your aftercare needs so that they can reassess your need for medications and monitor your lab values. You Must read complete instructions/literature along with all the possible adverse reactions/side effects for all the Medicines you take and that have been prescribed to you. Take any new Medicines after you have completely understood and accept all the possible adverse reactions/side effects. Wear Seat belts while driving. If you have smoked or chewed Tobacco in the last 2 yrs please stop smoking and/or stop any Recreational drug use.   Increase activity slowly   Complete by:  As directed      Discharge Exam: Filed Weights   06/23/18 0649 06/24/18 0620 06/24/18 0839  Weight: 62.9 kg 60.8 kg 60.1 kg   Vitals:   06/23/18 2114 06/24/18 0818  BP: 107/70 (!) 122/94  Pulse: 70 86  Resp: 18 20  Temp: 98.3 F (36.8 C) 98.3 F (36.8 C)  SpO2: 98% 100%   General: Appear in no distress, no Rash; Oral Mucosa moist. Cardiovascular: S1 and S2 Present, no Murmur, no JVD Respiratory: Bilateral Air entry present and Clear to Auscultation, no Crackles, no wheezes Abdomen: Bowel Sound present, Soft and no tenderness Extremities: no Pedal edema, no calf tenderness Neurology: Grossly no focal neuro deficit.  The results of significant diagnostics from this hospitalization (including imaging, microbiology, ancillary and laboratory) are listed below for reference.    Significant Diagnostic Studies: Ct Abdomen Pelvis Wo Contrast  Result Date: 06/21/2018 CLINICAL DATA:  62 year old female with history of severe headache. Sepsis. EXAM: CT ABDOMEN AND PELVIS WITHOUT CONTRAST TECHNIQUE: Multidetector CT imaging of the abdomen and pelvis was performed following the standard protocol without IV contrast. COMPARISON:  CT the abdomen and pelvis 04/19/2018. FINDINGS: Lower chest: Cardiomegaly. Small bilateral pleural effusions lying dependently. Patchy multifocal ground-glass attenuation and septal  thickening throughout the lung bases bilaterally. Hepatobiliary: No definite cystic or solid hepatic lesions are confidently identified on today's noncontrast CT examination. Status post cholecystectomy. Pancreas: No definite pancreatic mass or peripancreatic fluid or inflammatory changes are noted on today's noncontrast CT examination. Spleen: Unremarkable. Adrenals/Urinary Tract: Unenhanced appearance of the kidneys and bilateral adrenal glands is normal. No hydroureteronephrosis. Urinary bladder is normal in appearance. Stomach/Bowel: Unenhanced appearance of the stomach is normal. No pathologic dilatation of small bowel or colon. Normal appendix. Vascular/Lymphatic: Aortic atherosclerosis. No lymphadenopathy noted in the abdomen or pelvis. Reproductive: Status post hysterectomy. Right ovary is not confidently identified may be surgically absent or atrophic. 1.8 cm fatty attenuation lesion in the left adnexa, stable compared to numerous prior examinations, most compatible with a small dermoid. Other: No significant volume of ascites.  No pneumoperitoneum. Musculoskeletal: There are no aggressive appearing lytic or blastic lesions noted in the visualized portions of the skeleton.  IMPRESSION: 1. No acute findings are noted in the abdomen or pelvis to account for the patient's symptoms. 2. Findings in the lung bases concerning for congestive heart failure, as above. 3. Aortic atherosclerosis. 4. Additional incidental findings, as above. Electronically Signed   By: Vinnie Langton M.D.   On: 06/21/2018 02:58   Dg Chest 2 View  Result Date: 06/07/2018 CLINICAL DATA:  Productive cough. EXAM: CHEST - 2 VIEW COMPARISON:  Radiographs of May 24, 2018. FINDINGS: Stable cardiomegaly. Right internal jugular dialysis catheter is unchanged in position. No pneumothorax or pleural effusion is noted. Left lung is clear. Atherosclerosis of thoracic aorta is noted. Stable right basilar opacity is noted most consistent  with pneumonia. Bony thorax is unremarkable. IMPRESSION: Stable right basilar opacity is noted most consistent with pneumonia. Electronically Signed   By: Marijo Conception, M.D.   On: 06/07/2018 19:27   Ct Head Wo Contrast  Result Date: 06/23/2018 CLINICAL DATA:  Initial evaluation for chronic headache. EXAM: CT HEAD WITHOUT CONTRAST TECHNIQUE: Contiguous axial images were obtained from the base of the skull through the vertex without intravenous contrast. COMPARISON:  Prior CT from 06/21/2018. FINDINGS: Brain: Cerebral volume within normal limits for patient age. No evidence for acute intracranial hemorrhage. No findings to suggest acute large vessel territory infarct. No mass lesion, midline shift, or mass effect. Ventricles are normal in size without evidence for hydrocephalus. No extra-axial fluid collection identified. Vascular: No hyperdense vessel identified. Skull: Scalp soft tissues demonstrate no acute abnormality. Calvarium intact. Sinuses/Orbits: Globes and orbital soft tissues within normal limits. Chronic left maxillary sinusitis. Small retention cyst noted within the left sphenoid sinus. Mastoid air cells are clear. IMPRESSION: 1. No acute intracranial abnormality. 2. Chronic left maxillary sinusitis. Electronically Signed   By: Jeannine Boga M.D.   On: 06/23/2018 22:54   Ct Head Wo Contrast  Result Date: 06/21/2018 CLINICAL DATA:  Thunderclap headache. EXAM: CT HEAD WITHOUT CONTRAST TECHNIQUE: Contiguous axial images were obtained from the base of the skull through the vertex without intravenous contrast. COMPARISON:  None. FINDINGS: Brain: There is no mass, hemorrhage or extra-axial collection. The size and configuration of the ventricles and extra-axial CSF spaces are normal. There is no acute or chronic infarction. The brain parenchyma is normal. Vascular: No abnormal hyperdensity of the major intracranial arteries or dural venous sinuses. No intracranial atherosclerosis. Skull:  The visualized skull base, calvarium and extracranial soft tissues are normal. Sinuses/Orbits: Mild left maxillary sinus mucosal thickening. No mastoid or middle ear effusion. The orbits are normal. IMPRESSION: Normal brain. Electronically Signed   By: Ulyses Jarred M.D.   On: 06/21/2018 02:32   Dg Chest Port 1 View  Result Date: 06/22/2018 CLINICAL DATA:  Shortness of breath EXAM: PORTABLE CHEST 1 VIEW COMPARISON:  06/20/2018 FINDINGS: Cardiac shadow is enlarged. Dialysis catheter is again seen. Persistent right basilar infiltrate is noted although significantly improved when compared with the prior exam. No significant interstitial edema is noted. No bony abnormality is seen. IMPRESSION: Significant improved aeration in the right base. Electronically Signed   By: Inez Catalina M.D.   On: 06/22/2018 12:38   Dg Chest Port 1 View  Result Date: 06/20/2018 CLINICAL DATA:  Initial evaluation for acute shortness of breath, cough. EXAM: PORTABLE CHEST 1 VIEW COMPARISON:  Prior radiograph from 06/07/2018. FINDINGS: Right-sided dual lumen hemodialysis catheter in place with tips overlying the cavoatrial junction. Moderate cardiomegaly, stable. Mediastinal silhouette within normal limits. Aortic atherosclerosis. Lungs mildly hypoinflated. Mild diffuse pulmonary vascular and interstitial  congestion. Pat superimposed patchy right lower lobe opacity suspicious for possible infiltrate. Associated small moderate right pleural effusion with subpulmonic component. No pneumothorax. No acute osseous abnormality. IMPRESSION: 1. Patchy right lower lobe opacity, concerning for pneumonia given provided history of cough. This has worsened from 06/07/2018. 2. Associated small to moderate right pleural effusion. 3. Cardiomegaly with underlying mild diffuse pulmonary interstitial congestion/edema. 4. Aortic atherosclerosis. Electronically Signed   By: Jeannine Boga M.D.   On: 06/20/2018 22:00    Microbiology: Recent  Results (from the past 240 hour(s))  Blood culture (routine x 2)     Status: None   Collection Time: 06/20/18 10:03 PM  Result Value Ref Range Status   Specimen Description BLOOD RIGHT ANTECUBITAL  Final   Special Requests   Final    BOTTLES DRAWN AEROBIC AND ANAEROBIC Blood Culture adequate volume   Culture   Final    NO GROWTH 5 DAYS Performed at [facility], [address], [place], [place] [address]    Report Status 06/25/2018 FINAL  Final  MRSA PCR Screening     Status: None   Collection Time: 06/20/18 11:54 PM  Result Value Ref Range Status   MRSA by PCR NEGATIVE NEGATIVE Final    Comment:        The GeneXpert MRSA Assay (FDA approved for NASAL specimens only), is one component of a comprehensive MRSA colonization surveillance program. It is not intended to diagnose MRSA infection nor to guide or monitor treatment for MRSA infections. Performed at [facility], [place] [address], [place], [place] [address]   Blood culture (routine x 2)     Status: None (Preliminary result)   Collection Time: 06/21/18 12:27 AM  Result Value Ref Range Status   Specimen Description BLOOD RIGHT HAND  Final   Special Requests   Final    BOTTLES DRAWN AEROBIC ONLY Blood Culture adequate volume   Culture   Final    NO GROWTH 4 DAYS Performed at [facility], [place] [address], [place], [place] [address]    Report Status PENDING  Incomplete     Labs: CBC: Recent Labs  Lab 06/20/18 2042 06/21/18 0909 06/22/18 0244 06/23/18 0239 06/24/18 0421  WBC 6.6 8.0 5.6 5.4 8.3  NEUTROABS 3.8  --   --   --   --   HGB 9.8* 10.6* 11.2* 11.7* 13.4  HCT 34.7* 35.5* 38.6 38.7 44.9  MCV 77.5* 75.4* 75.4* 74.0* 73.0*  PLT 204 232 215 203 211   Basic Metabolic Panel: Recent Labs  Lab 06/20/18 2042 06/21/18 0909 06/22/18 0244 06/23/18 0239 06/24/18 0421  NA 140 140 137 136 135  K 3.8 4.1 3.7 3.8 3.9  CL 112* 108 100 103 97*  CO2 20* 24 27 24 23   GLUCOSE 87 132* 101* 86 103*    BUN 38* 38* 11 27* 20  CREATININE 2.84* 2.93* 2.23* 3.20* 3.39*  CALCIUM 8.3* 8.4* 8.2* 8.3* 9.0  PHOS  --   --  4.3 4.4 3.8   Liver Function Tests: Recent Labs  Lab 06/20/18 2042 06/22/18 0244 06/23/18 0239 06/24/18 0421  AST 32  --   --   --   ALT 16  --   --   --   ALKPHOS 76  --   --   --   BILITOT 0.3  --   --   --   PROT 6.0*  --   --   --   ALBUMIN 2.3*  2.2* 2.2* 2.6*   No results for input(s): LIPASE, AMYLASE in the last 168 hours. No results for input(s): AMMONIA in the last 168 hours. Cardiac Enzymes: Recent Labs  Lab 06/20/18 2042 06/21/18 0229 06/21/18 0909 06/21/18 1417  TROPONINI 0.05* 0.07* 0.07* 0.05*   BNP (last 3 results) Recent Labs    02/12/18 1534 06/20/18 2042  BNP 225.7* >4,500.0*   CBG: Recent Labs  Lab 06/23/18 1157 06/23/18 1743 06/23/18 2110 06/24/18 0758 06/24/18 1156  GLUCAP 157* 164* 134* 167* 149*   Time spent: 35 minutes  Signed:  Berle Mull  [facility] 06/24/2018   , 9:50 PM

## 2018-06-26 LAB — CULTURE, BLOOD (ROUTINE X 2)
Culture: NO GROWTH
SPECIAL REQUESTS: ADEQUATE

## 2018-06-27 ENCOUNTER — Other Ambulatory Visit (INDEPENDENT_AMBULATORY_CARE_PROVIDER_SITE_OTHER): Payer: Self-pay | Admitting: Physician Assistant

## 2018-06-27 DIAGNOSIS — E1121 Type 2 diabetes mellitus with diabetic nephropathy: Secondary | ICD-10-CM

## 2018-06-27 MED ORDER — "INSULIN SYRINGE 31G X 5/16"" 0.3 ML MISC": 1.0000 | Freq: Four times a day (QID) | 5 refills | Status: AC

## 2018-07-09 ENCOUNTER — Other Ambulatory Visit (INDEPENDENT_AMBULATORY_CARE_PROVIDER_SITE_OTHER): Payer: Self-pay | Admitting: Physician Assistant

## 2018-07-09 DIAGNOSIS — E1121 Type 2 diabetes mellitus with diabetic nephropathy: Secondary | ICD-10-CM

## 2018-07-17 ENCOUNTER — Inpatient Hospital Stay (HOSPITAL_COMMUNITY): Payer: Medicare Other

## 2018-07-17 ENCOUNTER — Emergency Department (HOSPITAL_COMMUNITY): Payer: Medicare Other

## 2018-07-17 ENCOUNTER — Inpatient Hospital Stay (HOSPITAL_COMMUNITY)
Admission: EM | Admit: 2018-07-17 | Discharge: 2018-07-23 | DRG: 981 | Disposition: A | Discharge status: Home / Self Care | Payer: Medicare Other | Attending: Neurology | Admitting: Neurology

## 2018-07-17 ENCOUNTER — Other Ambulatory Visit: Payer: Self-pay

## 2018-07-17 ENCOUNTER — Encounter (HOSPITAL_COMMUNITY): Payer: Self-pay

## 2018-07-17 DIAGNOSIS — I619 Nontraumatic intracerebral hemorrhage, unspecified: Secondary | ICD-10-CM

## 2018-07-17 DIAGNOSIS — N2581 Secondary hyperparathyroidism of renal origin: Secondary | ICD-10-CM | POA: Diagnosis present

## 2018-07-17 DIAGNOSIS — T45515A Adverse effect of anticoagulants, initial encounter: Secondary | ICD-10-CM | POA: Diagnosis present

## 2018-07-17 DIAGNOSIS — F1910 Other psychoactive substance abuse, uncomplicated: Secondary | ICD-10-CM | POA: Diagnosis present

## 2018-07-17 DIAGNOSIS — F17200 Nicotine dependence, unspecified, uncomplicated: Secondary | ICD-10-CM | POA: Diagnosis present

## 2018-07-17 DIAGNOSIS — T82868A Thrombosis of vascular prosthetic devices, implants and grafts, initial encounter: Secondary | ICD-10-CM | POA: Diagnosis present

## 2018-07-17 DIAGNOSIS — Y838 Other surgical procedures as the cause of abnormal reaction of the patient, or of later complication, without mention of misadventure at the time of the procedure: Secondary | ICD-10-CM | POA: Diagnosis present

## 2018-07-17 DIAGNOSIS — I161 Hypertensive emergency: Secondary | ICD-10-CM | POA: Diagnosis present

## 2018-07-17 DIAGNOSIS — Z9115 Patient's noncompliance with renal dialysis: Secondary | ICD-10-CM | POA: Diagnosis not present

## 2018-07-17 DIAGNOSIS — I5033 Acute on chronic diastolic (congestive) heart failure: Secondary | ICD-10-CM | POA: Diagnosis present

## 2018-07-17 DIAGNOSIS — I615 Nontraumatic intracerebral hemorrhage, intraventricular: Secondary | ICD-10-CM | POA: Diagnosis present

## 2018-07-17 DIAGNOSIS — Z91199 Patient's noncompliance with other medical treatment and regimen due to unspecified reason: Secondary | ICD-10-CM

## 2018-07-17 DIAGNOSIS — IMO0001 Reserved for inherently not codable concepts without codable children: Secondary | ICD-10-CM | POA: Diagnosis present

## 2018-07-17 DIAGNOSIS — Z87891 Personal history of nicotine dependence: Secondary | ICD-10-CM | POA: Diagnosis not present

## 2018-07-17 DIAGNOSIS — E1165 Type 2 diabetes mellitus with hyperglycemia: Secondary | ICD-10-CM | POA: Diagnosis present

## 2018-07-17 DIAGNOSIS — I132 Hypertensive heart and chronic kidney disease with heart failure and with stage 5 chronic kidney disease, or end stage renal disease: Secondary | ICD-10-CM | POA: Diagnosis present

## 2018-07-17 DIAGNOSIS — F1410 Cocaine abuse, uncomplicated: Secondary | ICD-10-CM | POA: Diagnosis present

## 2018-07-17 DIAGNOSIS — E1122 Type 2 diabetes mellitus with diabetic chronic kidney disease: Secondary | ICD-10-CM | POA: Diagnosis present

## 2018-07-17 DIAGNOSIS — D509 Iron deficiency anemia, unspecified: Secondary | ICD-10-CM | POA: Diagnosis present

## 2018-07-17 DIAGNOSIS — F1010 Alcohol abuse, uncomplicated: Secondary | ICD-10-CM | POA: Diagnosis present

## 2018-07-17 DIAGNOSIS — Z992 Dependence on renal dialysis: Secondary | ICD-10-CM

## 2018-07-17 DIAGNOSIS — Z9114 Patient's other noncompliance with medication regimen: Secondary | ICD-10-CM | POA: Diagnosis not present

## 2018-07-17 DIAGNOSIS — Z7901 Long term (current) use of anticoagulants: Secondary | ICD-10-CM | POA: Diagnosis not present

## 2018-07-17 DIAGNOSIS — G43909 Migraine, unspecified, not intractable, without status migrainosus: Secondary | ICD-10-CM | POA: Diagnosis present

## 2018-07-17 DIAGNOSIS — R791 Abnormal coagulation profile: Secondary | ICD-10-CM | POA: Diagnosis present

## 2018-07-17 DIAGNOSIS — N186 End stage renal disease: Secondary | ICD-10-CM | POA: Diagnosis present

## 2018-07-17 DIAGNOSIS — Z886 Allergy status to analgesic agent status: Secondary | ICD-10-CM

## 2018-07-17 DIAGNOSIS — I2699 Other pulmonary embolism without acute cor pulmonale: Secondary | ICD-10-CM | POA: Diagnosis present

## 2018-07-17 DIAGNOSIS — D638 Anemia in other chronic diseases classified elsewhere: Secondary | ICD-10-CM | POA: Diagnosis present

## 2018-07-17 DIAGNOSIS — Z86711 Personal history of pulmonary embolism: Secondary | ICD-10-CM

## 2018-07-17 DIAGNOSIS — E8889 Other specified metabolic disorders: Secondary | ICD-10-CM | POA: Diagnosis present

## 2018-07-17 DIAGNOSIS — I10 Essential (primary) hypertension: Secondary | ICD-10-CM | POA: Diagnosis present

## 2018-07-17 DIAGNOSIS — Z9119 Patient's noncompliance with other medical treatment and regimen: Secondary | ICD-10-CM

## 2018-07-17 DIAGNOSIS — E119 Type 2 diabetes mellitus without complications: Secondary | ICD-10-CM

## 2018-07-17 DIAGNOSIS — Z9981 Dependence on supplemental oxygen: Secondary | ICD-10-CM

## 2018-07-17 DIAGNOSIS — I610 Nontraumatic intracerebral hemorrhage in hemisphere, subcortical: Secondary | ICD-10-CM | POA: Diagnosis not present

## 2018-07-17 DIAGNOSIS — T827XXA Infection and inflammatory reaction due to other cardiac and vascular devices, implants and grafts, initial encounter: Secondary | ICD-10-CM | POA: Diagnosis present

## 2018-07-17 DIAGNOSIS — Z01810 Encounter for preprocedural cardiovascular examination: Secondary | ICD-10-CM | POA: Diagnosis not present

## 2018-07-17 DIAGNOSIS — Z794 Long term (current) use of insulin: Secondary | ICD-10-CM

## 2018-07-17 LAB — CBC WITH DIFFERENTIAL/PLATELET
BASOS ABS: 0.2 10*3/uL — AB (ref 0.0–0.1)
Basophils Relative: 5 %
EOS ABS: 0.2 10*3/uL (ref 0.0–0.5)
EOS PCT: 5 %
HEMATOCRIT: 45.9 % (ref 36.0–46.0)
Hemoglobin: 13 g/dL (ref 12.0–15.0)
Lymphocytes Relative: 27 %
Lymphs Abs: 1.2 10*3/uL (ref 0.7–4.0)
MCH: 20.9 pg — ABNORMAL LOW (ref 26.0–34.0)
MCHC: 28.3 g/dL — ABNORMAL LOW (ref 30.0–36.0)
MCV: 73.8 fL — ABNORMAL LOW (ref 80.0–100.0)
Monocytes Absolute: 0.5 10*3/uL (ref 0.1–1.0)
Monocytes Relative: 12 %
NEUTROS PCT: 50 %
NRBC: 0 % (ref 0.0–0.2)
NRBC: 0 /100{WBCs}
Neutro Abs: 2.2 10*3/uL (ref 1.7–7.7)
Platelets: 124 10*3/uL — ABNORMAL LOW (ref 150–400)
Promyelocytes Relative: 1 %
RBC: 6.22 MIL/uL — AB (ref 3.87–5.11)
RDW: 21.5 % — AB (ref 11.5–15.5)
WBC: 4.4 10*3/uL (ref 4.0–10.5)

## 2018-07-17 LAB — RAPID URINE DRUG SCREEN, HOSP PERFORMED
Amphetamines: NOT DETECTED
Barbiturates: NOT DETECTED
Benzodiazepines: NOT DETECTED
Cocaine: POSITIVE — AB
Opiates: POSITIVE — AB
Tetrahydrocannabinol: NOT DETECTED

## 2018-07-17 LAB — BASIC METABOLIC PANEL
ANION GAP: 8 (ref 5–15)
BUN: 32 mg/dL — ABNORMAL HIGH (ref 8–23)
CO2: 23 mmol/L (ref 22–32)
Calcium: 9.1 mg/dL (ref 8.9–10.3)
Chloride: 110 mmol/L (ref 98–111)
Creatinine, Ser: 3.19 mg/dL — ABNORMAL HIGH (ref 0.44–1.00)
GFR calc Af Amer: 17 mL/min — ABNORMAL LOW (ref >60)
GFR, EST NON AFRICAN AMERICAN: 14 mL/min — AB (ref >60)
Glucose, Bld: 103 mg/dL — ABNORMAL HIGH (ref 70–99)
Potassium: 4.3 mmol/L (ref 3.5–5.1)
Sodium: 141 mmol/L (ref 135–145)

## 2018-07-17 LAB — PROTIME-INR
INR: 3.4
INR: 1.19
PROTHROMBIN TIME: 33.8 s — AB (ref 11.4–15.2)
PROTHROMBIN TIME: 15 s (ref 11.4–15.2)

## 2018-07-17 LAB — GLUCOSE, CAPILLARY: Glucose-Capillary: 157 mg/dL — ABNORMAL HIGH (ref 70–99)

## 2018-07-17 LAB — MRSA PCR SCREENING: MRSA by PCR: NEGATIVE

## 2018-07-17 MED ORDER — CLEVIDIPINE BUTYRATE 0.5 MG/ML IV EMUL
0.0000 mg/h | INTRAVENOUS | Status: DC
  Administered 2018-07-17: 13 mg/h via INTRAVENOUS
  Administered 2018-07-17: 8 mg/h via INTRAVENOUS
  Administered 2018-07-17: 14 mg/h via INTRAVENOUS
  Administered 2018-07-17: 1 mg/h via INTRAVENOUS
  Administered 2018-07-17: 9 mg/h via INTRAVENOUS
  Administered 2018-07-18: 8 mg/h via INTRAVENOUS
  Administered 2018-07-18: 5 mg/h via INTRAVENOUS
  Administered 2018-07-18: 8 mg/h via INTRAVENOUS
  Filled 2018-07-17: qty 50
  Filled 2018-07-17: qty 100
  Filled 2018-07-17 (×5): qty 50

## 2018-07-17 MED ORDER — LABETALOL HCL 5 MG/ML IV SOLN
20.0000 mg | Freq: Once | INTRAVENOUS | Status: AC
  Administered 2018-07-17: 20 mg via INTRAVENOUS
  Filled 2018-07-17: qty 4

## 2018-07-17 MED ORDER — SENNOSIDES-DOCUSATE SODIUM 8.6-50 MG PO TABS
1.0000 | ORAL_TABLET | Freq: Two times a day (BID) | ORAL | Status: DC
  Administered 2018-07-17 – 2018-07-23 (×12): 1 via ORAL
  Filled 2018-07-17 (×12): qty 1

## 2018-07-17 MED ORDER — LABETALOL HCL 5 MG/ML IV SOLN
5.0000 mg | INTRAVENOUS | Status: DC | PRN
  Administered 2018-07-17: 5 mg via INTRAVENOUS
  Filled 2018-07-17: qty 4

## 2018-07-17 MED ORDER — ONDANSETRON HCL 4 MG/2ML IJ SOLN
4.0000 mg | Freq: Once | INTRAMUSCULAR | Status: AC
  Administered 2018-07-17: 4 mg via INTRAVENOUS
  Filled 2018-07-17: qty 2

## 2018-07-17 MED ORDER — STROKE: EARLY STAGES OF RECOVERY BOOK
Freq: Once | Status: AC
  Administered 2018-07-17: 18:00:00
  Filled 2018-07-17: qty 1

## 2018-07-17 MED ORDER — SODIUM CHLORIDE 0.9% FLUSH: 10.0000 mL | INTRAVENOUS | Status: DC | PRN

## 2018-07-17 MED ORDER — MORPHINE SULFATE (PF) 4 MG/ML IV SOLN
6.0000 mg | Freq: Once | INTRAVENOUS | Status: AC
  Administered 2018-07-17: 6 mg via INTRAVENOUS
  Filled 2018-07-17: qty 2

## 2018-07-17 MED ORDER — CHLORHEXIDINE GLUCONATE CLOTH 2 % EX PADS
6.0000 | MEDICATED_PAD | Freq: Every day | CUTANEOUS | Status: DC
  Administered 2018-07-18 – 2018-07-23 (×6): 6 via TOPICAL

## 2018-07-17 MED ORDER — SODIUM CHLORIDE 0.9% FLUSH
10.0000 mL | Freq: Two times a day (BID) | INTRAVENOUS | Status: DC
  Administered 2018-07-18 – 2018-07-23 (×8): 10 mL

## 2018-07-17 MED ORDER — VITAMIN K1 10 MG/ML IJ SOLN
10.0000 mg | Freq: Once | INTRAVENOUS | Status: AC
  Administered 2018-07-17: 10 mg via INTRAVENOUS
  Filled 2018-07-17: qty 1

## 2018-07-17 MED ORDER — PANTOPRAZOLE SODIUM 40 MG IV SOLR
40.0000 mg | Freq: Every day | INTRAVENOUS | Status: DC
  Administered 2018-07-17: 40 mg via INTRAVENOUS
  Filled 2018-07-17: qty 40

## 2018-07-17 MED ORDER — TRAMADOL HCL 50 MG PO TABS
50.0000 mg | ORAL_TABLET | Freq: Four times a day (QID) | ORAL | Status: DC | PRN
  Administered 2018-07-17 – 2018-07-18 (×2): 100 mg via ORAL
  Filled 2018-07-17 (×2): qty 2

## 2018-07-17 MED ORDER — IOPAMIDOL (ISOVUE-370) INJECTION 76%
75.0000 mL | Freq: Once | INTRAVENOUS | Status: AC | PRN
  Administered 2018-07-17: 100 mL via INTRAVENOUS

## 2018-07-17 MED ORDER — PROTHROMBIN COMPLEX CONC HUMAN 500 UNITS IV KIT
1622.0000 [IU] | PACK | Freq: Once | Status: AC
  Administered 2018-07-17: 1622 [IU] via INTRAVENOUS
  Filled 2018-07-17: qty 622

## 2018-07-17 MED ORDER — INSULIN ASPART 100 UNIT/ML ~~LOC~~ SOLN
0.0000 [IU] | Freq: Three times a day (TID) | SUBCUTANEOUS | Status: DC
  Administered 2018-07-17: 2 [IU] via SUBCUTANEOUS
  Administered 2018-07-18 – 2018-07-23 (×4): 1 [IU] via SUBCUTANEOUS

## 2018-07-17 MED ORDER — IOPAMIDOL (ISOVUE-370) INJECTION 76%
INTRAVENOUS | Status: AC
  Filled 2018-07-17: qty 100

## 2018-07-17 NOTE — ED Notes (Signed)
Patient transported to CT 

## 2018-07-17 NOTE — ED Notes (Signed)
Lunch tray ordered for pt.

## 2018-07-17 NOTE — ED Triage Notes (Addendum)
Pt c/o intermittent HA x 1 month; predominantly L sided; reddened sclera of L eye; dialysis pt, last dialyzed last Thursday; pt states "I've been too sick to go" (w/ HA); pt hypertensive in triage (190/112), pt states she has been out of BP meds x 2 days

## 2018-07-17 NOTE — ED Provider Notes (Addendum)
[facility] EMERGENCY DEPARTMENT Provider Note   CSN: [id number] Arrival date & time: 07/17/18  1962     History   Chief Complaint Chief Complaint  Patient presents with  . Headache    HPI Rhonda Lynch is a 62 y.o. female.  The history is provided by the patient and medical records. No language interpreter was used.  Headache       62 year old female with history of diabetes, end-stage renal disease, hypertension and prior history of substance abuse presenting complaining of headache.  Patient report progressive worsening bitemporal headache ongoing for the past 3 weeks.  For the past week headaches has intensified.  She described as a sharp throbbing achy sensation, now persistent of moderate to severe in intensity.  She endorsed mild blurry vision on the left eye and noticed that her eyes was red for the past few days.  She endorsed mild nausea without vomiting.  She denies any associated fever, chills, URI symptoms, neck stiffness, chest pain, trouble breathing, abdominal cramping, focal numbness or weakness.  She has been taking Advil at home for her headache without relief.  She mentioned missing 3 of her dialysis appointment within this week due to not feeling well.  Furthermore, she mention been out of her blood pressure medication for the past several days.  She admits her last cocaine use was about a month ago.  Past Medical History:  Diagnosis Date  . Chronic kidney disease   . Diabetes mellitus without complication ([place])   . ESRD (end stage renal disease) ([place])   . Hypertension   . Oxygen deficiency    2 L at night  . Substance abuse ([place])    has been clean for 4 months     Patient Active Problem List   Diagnosis Date Noted  . HCAP (healthcare-associated pneumonia) 06/21/2018  . Substance induced mood disorder ([place]) 05/15/2018  . Agitation   . Hemodialysis catheter infection, initial encounter ([place])   . 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 Pulmonary  embolism ([place]) 04/27/2018  . Sepsis ([place]) 04/26/2018  . Dyspnea 04/26/2018  . Chest pain 04/26/2018  . Tachycardia 04/26/2018  . Abdominal pain 04/18/2018  . DM (diabetes mellitus), type 2 with renal complications ([place]) 22/97/9892  . Right flank pain 04/18/2018  . Acute respiratory failure with hypoxia ([place]) 04/12/2018  . SVT (supraventricular tachycardia) ([place])   . Chronic renal failure   . Diabetes ([place]) 04/02/2018  . Hypertension   . Noncompliance of patient with renal dialysis ([place])   . Acute pulmonary edema ([facility])   . Elevated troponin 03/19/2018  . AKI (acute kidney injury) ([place])   . Fever   . 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 Diffuse pulmonary alveolar hemorrhage   . Hypoxemia   . Pulmonary infiltrate   . 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[place])   . ESRD (end stage renal disease) ([place]) 02/12/2018  . Acute respiratory failure ([place]) 02/12/2018  . Alcohol abuse 02/12/2018  . Anemia 02/12/2018  . Cocaine abuse ([place]) 02/12/2018  . Epigastric pain 02/12/2018  . Pneumonia 02/12/2018  . TOBACCO ABUSE 03/08/2010  . PNEUMONIA 03/08/2010  . UTI 03/08/2010  . ELEVATED BLOOD PRESSURE WITHOUT DIAGNOSIS OF HYPERTENSION 03/08/2010    Past Surgical History:  Procedure Laterality Date  . AV FISTULA PLACEMENT Left 03/05/2018   Procedure: ARTERIOVENOUS (AV) FISTULA CREATION  LEFT UPPER EXTREMITY;  Surgeon: [name], MD;  Location: [facility];  Service: Vascular;  Laterality: Left;  . HEMATOMA EVACUATION Left 03/06/2018  Procedure: EVACUATION HEMATOMA LEFT ARM;  Surgeon: Angelia Mould, MD;  Location: [facility];  Service: Vascular;  Laterality: Left;  . INSERTION OF DIALYSIS CATHETER Left 04/29/2018   Procedure: INSERTION OF DIALYSIS CATHETER LEFT INTERNAL JUGULAR;  Surgeon: Angelia Mould, MD;  Location: [facility];  Service: Vascular;  Laterality: Left;  . IR FLUORO GUIDE CV LINE RIGHT  02/18/2018  . IR FLUORO GUIDE CV LINE RIGHT  02/26/2018  . IR REMOVAL TUN  CV CATH W/O FL  04/27/2018  . IR US GUIDE VASC ACCESS RIGHT  02/18/2018  . TEE WITHOUT CARDIOVERSION N/A 04/29/2018   Procedure: TRANSESOPHAGEAL ECHOCARDIOGRAM (TEE);  Surgeon: Sueanne Margarita, MD;  Location[facility] ENDOSCOPY;  Service: Cardiovascular;  Laterality: N/A;     OB History   None      Home Medications    Prior to Admission medications   Medication Sig Start Date End Date Taking? Authorizing Provider  acetaminophen (TYLENOL) 325 MG tablet Take 2 tablets (650 mg total) by mouth every 6 (six) hours as needed for mild pain (or Fever >/= 101). 04/24/18   Ghimire, Henreitta Leber, MD  Blood Glucose Monitoring Suppl (ACCU-CHEK AVIVA) device Use as instructed 06/05/18 06/05/19  Clent Demark, PA-C  calcitRIOL (ROCALTROL) 0.25 MCG capsule Take 1 capsule (0.25 mcg total) by mouth every other day. 04/14/18   Nita Sells, MD  feeding supplement, GLUCERNA SHAKE, (GLUCERNA SHAKE) LIQD Take 237 mLs by mouth 2 (two) times daily between meals. Patient not taking: Reported on 04/26/2018 04/24/18   Jonetta Osgood, MD  fluticasone [facility]) 50 MCG/ACT nasal spray Place 2 sprays into both nostrils daily. 06/24/18   Lavina Hamman, MD  glucose blood (ACCU-CHEK AVIVA) test strip Use twice per day. 06/05/18   Clent Demark, PA-C  hydrOXYzine (ATARAX/VISTARIL) 25 MG tablet Take 1 tablet (25 mg total) by mouth 3 (three) times daily as needed for itching. 06/23/18   Lavina Hamman, MD  insulin aspart (NOVOLOG) 100 UNIT/ML injection Inject 0-9 Units into the skin 3 (three) times daily with meals. 04/13/18   Nita Sells, MD  insulin glargine (LANTUS) 100 UNIT/ML injection Inject 0.05 mLs (5 Units total) into the skin at bedtime. 04/13/18   Nita Sells, MD  Insulin Syringe-Needle U-100 (INSULIN SYRINGE .3CC/31GX5/16") 31G X 5/16" 0.3 ML MISC Inject 1 each into the skin 4 (four) times daily. 06/27/18   Clent Demark, PA-C  Lancets (ACCU-CHEK SOFT [facility]) lancets Use twice per day.  06/05/18   Clent Demark, PA-C  metoprolol tartrate (LOPRESSOR) 25 MG tablet Take 1 tablet (25 mg total) by mouth 2 (two) times daily. 05/02/18   Shelly Coss, MD  multivitamin (RENA-VIT) TABS tablet Take 1 tablet by mouth at bedtime. Patient not taking: Reported on 06/08/2018 04/13/18   Nita Sells, MD  ondansetron (ZOFRAN) 4 MG tablet Take 1 tablet (4 mg total) by mouth every 6 (six) hours as needed for nausea. 04/13/18   Nita Sells, MD  sodium chloride (OCEAN) 0.65 % SOLN nasal spray Place 1 spray into both nostrils as needed for congestion. 06/24/18   Lavina Hamman, MD  warfarin (COUMADIN) 2.5 MG tablet Take 1 tablet (2.5 mg total) by mouth daily at 6 PM. 05/03/18   Shelly Coss, MD  zolpidem (AMBIEN) 5 MG tablet Take 1 tablet (5 mg total) by mouth at bedtime as needed for sleep. 04/13/18   Nita Sells, MD    Family History Family History  Problem Relation Age of Onset  .  Autoimmune disease Neg Hx     Social History Social History   Tobacco Use  . Smoking status: Former Smoker    Packs/day: 0.50    Last attempt to quit: 04/08/2018    Years since quitting: 0.2  . Smokeless tobacco: Never Used  . Tobacco comment: trying to quit 04/02/2018  Substance Use Topics  . Alcohol use: Yes  . Drug use: Yes    Types: Cocaine, Heroin    Comment: See notes     Allergies   Acetaminophen   Review of Systems Review of Systems  Neurological: Positive for headaches.  All other systems reviewed and are negative.    Physical Exam Updated Vital Signs BP (!) 190/112 (BP Location: Right Arm)   Pulse 76   Temp 98.8 F (37.1 C) (Oral)   Resp 18   Ht 5\' 1"  (1.549 m)   Wt 63.5 kg   SpO2 99%   BMI 26.45 kg/m   Physical Exam  Constitutional: She is oriented to person, place, and time. She appears well-developed and well-nourished. No distress.  HENT:  Head: Normocephalic and atraumatic.  Mouth/Throat: Oropharynx is clear and moist.  Ears: Normal TMs  bilateral Nose: Normal nares Throat: Poor dentition uvula midline No temporal bruit  Eyes: Pupils are equal, round, and reactive to light. Conjunctivae and EOM are normal.  Cloudy lens noted to bilateral eyes.  Evidence of left sub-conjunctiva hemorrhage noted pupils equal round reactive, extraocular movements intact  Neck: Normal range of motion. Neck supple.  No nuchal rigidity  Cardiovascular: Normal rate and regular rhythm.  Pulmonary/Chest: Effort normal and breath sounds normal.  Abdominal: Soft.  Neurological: She is alert and oriented to person, place, and time. She has normal strength. No cranial nerve deficit or sensory deficit. She displays a negative Romberg sign. Coordination and gait normal. GCS eye subscore is 4. GCS verbal subscore is 5. GCS motor subscore is 6.  Skin: No rash noted.  Psychiatric: She has a normal mood and affect.  Nursing note and vitals reviewed.    ED Treatments / Results  Labs (all labs ordered are listed, but only abnormal results are displayed) Labs Reviewed  CBC WITH DIFFERENTIAL/PLATELET - Abnormal; Notable for the following components:      Result Value   RBC 6.22 (*)    MCV 73.8 (*)    MCH 20.9 (*)    MCHC 28.3 (*)    RDW 21.5 (*)    Platelets 124 (*)    Basophils Absolute 0.2 (*)    All other components within normal limits  BASIC METABOLIC PANEL - Abnormal; Notable for the following components:   Glucose, Bld 103 (*)    BUN 32 (*)    Creatinine, Ser 3.19 (*)    GFR calc non Af Amer 14 (*)    GFR calc Af Amer 17 (*)    All other components within normal limits  PROTIME-INR  RAPID URINE DRUG SCREEN, HOSP PERFORMED    EKG None  Radiology Ct Head Wo Contrast  Result Date: 07/17/2018 CLINICAL DATA:  Headache and left eye pain for 3 weeks. EXAM: CT HEAD WITHOUT CONTRAST TECHNIQUE: Contiguous axial images were obtained from the base of the skull through the vertex without intravenous contrast. COMPARISON:  Head CT scan 06/21/2018.  FINDINGS: Brain: The patient has a new small hemorrhage which appears to be deep in the inferior horn of the left lateral ventricle. No mass, hydrocephalus, mass effect or midline shift is identified. No evidence of acute  infarct is seen. Vascular: Atherosclerosis is noted. Skull: Intact.  No focal lesion. Sinuses/Orbits: Negative. Other: None. IMPRESSION: Small hemorrhage deep in the inferior horn of the left lateral ventricle. Cause for this abnormality is not identified but note is made that the patient is anticoagulated. Brain MRI and MRI for further evaluation recommended. Critical Value/emergent results were called by telephone at the time of interpretation on 07/17/2018 at 11:14 am to [place], P. A., who verbally acknowledged these results. Electronically Signed   By: Inge Rise M.D.   On: 07/17/2018 11:17    Procedures .Critical Care Performed by: Domenic Moras, PA-C Authorized by: Domenic Moras, PA-C   Critical care provider statement:    Critical care time (minutes):  45   Critical care was time spent personally by me on the following activities:  Discussions with consultants, evaluation of patient's response to treatment, examination of patient, ordering and performing treatments and interventions, ordering and review of laboratory studies, ordering and review of radiographic studies, pulse oximetry, re-evaluation of patient's condition, obtaining history from patient or surrogate and review of old charts   (including critical care time)  Medications Ordered in ED Medications   stroke: mapping our early stages of recovery book (has no administration in time range)  senna-docusate (Senokot-S) tablet 1 tablet (has no administration in time range)  pantoprazole (PROTONIX) injection 40 mg (has no administration in time range)  traMADol (ULTRAM) tablet 50-100 mg (has no administration in time range)  labetalol (NORMODYNE,TRANDATE) injection 20 mg (has no administration in time range)     And  clevidipine (CLEVIPREX) infusion 0.5 mg/mL (has no administration in time range)  morphine 4 MG/ML injection 6 mg (6 mg Intravenous Given 07/17/18 0959)  ondansetron (ZOFRAN) injection 4 mg (4 mg Intravenous Given 07/17/18 0957)     Initial Impression / Assessment and Plan / ED Course  I have reviewed the triage vital signs and the nursing notes.  Pertinent labs & imaging results that were available during my care of the patient were reviewed by me and considered in my medical decision making (see chart for details).     BP (!) 166/112   Pulse 67   Temp 98.8 F (37.1 C) (Oral)   Resp 11   Ht 5\' 1"  (1.549 m)   Wt 63.5 kg   SpO2 94%   BMI 26.45 kg/m    Final Clinical Impressions(s) / ED Diagnoses   Final diagnoses:  Left-sided nontraumatic intracerebral hemorrhage, unspecified cerebral location [facility])    ED Discharge Orders    None     9:40 AM Patient here with progressive worsening bilateral temporal headache.  She is nontoxic in appearance and does not have any focal neuro deficit concerning for space-occupying lesions or stroke, no fever or nuchal rigidity concerning for meningitis, no acute onset thunderclap headache concerning for subarachnoid hemorrhage.  Given her age and lack of headache history, will obtain head CT scan for further evaluation.  Patient also missed 3 of her previous dialysis appointment.  Will check labs, and anticipate admission for dialyzing if indicated.  11:20 AM Radiologist has contacted me through the phone to notify that patient has blood in her ventricle that appears to be acute.  He recommend brain MRI and MRA for further evaluation.  Appropriate imaging has been ordered.  Patient is currently on warfarin, will check INR.  She is also hypertensive with blood pressure in the 259D systolic.  Patient given labetalol as well as Morphine.  Care discussed with Dr.  Mesner.   11:43 AM Care discussed with neurologist Dr. Rory Percy who agrees to see pt  in the ER.   12:14 PM Dr. Rory Percy have seen and evaluated pt and will admit under his service for further evaluation and management of pt's ICH.  Pt will receive labetalol, clevidipine as management for her hypertensive state.  MR brain w/wo CM along with CT angio head and neck w/wo CM.  INR is 3.4, neurologist will reverse acute bleeding with Kcentra. Pt to be admit to neuro ICU.   Domenic Moras, PA-C 07/17/18 1217    Domenic Moras, PA-C 07/17/18 1437    Mesner, Corene Cornea, MD 07/17/18 1505

## 2018-07-17 NOTE — H&P (Signed)
Neurology Consultation  Reason for Consult: IVH Referring Physician: [place]  CC: Headache, IVH on the CT scan  History is obtained from: Patient/chart  HPI: Rhonda Lynch is a 62 y.o. female past medical history of diabetes, ESRD on HD, depression and suicidality,, hypertension noncompliant to medications, substance abuse with last cocaine use according to her 1 month ago, PE on Coumadin, presenting for evaluation of unremitting headaches for 3 to 4 weeks.  She said her headaches have been going on actually prior to that but past 3 to 4 weeks they have become much worse.  She also started having some blurred vision off and on in both eyes over this.  Of time.  Her symptoms of persisted and become worse from when they started, and that prompted her to come to the emergency room. A noncontrast CT of the head was performed that showed a small bleed in the inferior horn of the left lateral ventricle. Neurology was consulted for admission and further management. She denies any jaw claudication.  Denies any tingling or numbness or focal weakness.  Reports generalized lethargy and malaise.  Denies any fevers chills at this time.  Denies any cough shortness of breath chest pain nausea vomiting.  Patient had pulmonary embolism diagnosed in August 2019 and started on Coumadin.  She was at that time very sick with fungemia, sepsis and pneumonia as well.  Her most recent discharge was 06/24/2018 from the hospital which was for acute hypoxic respiratory failure, acute on chronic diastolic CHF, ESRD-missed hemodialysis and ensuing hypertensive urgency.  LKW: 3 weeks ago tpa given?: no, ICH Premorbid modified Rankin scale (mRS): 0 ICH Score: 1  ROS:ROS was performed and is negative except as noted in the HPI.  Past Medical History:  Diagnosis Date  . Chronic kidney disease   . Diabetes mellitus without complication ([place])   . ESRD (end stage renal disease) ([place])   . Hypertension   . Oxygen deficiency     2 L at night  . Substance abuse ([place])    has been clean for 4 months    Family History  Problem Relation Age of Onset  . Autoimmune disease Neg Hx    Social History:   reports that she quit smoking about 3 months ago. She smoked 0.50 packs per day. She has never used smokeless tobacco. She reports that she drinks alcohol. She reports that she has current or past drug history. Drugs: Cocaine and Heroin.  Medications  Current Facility-Administered Medications:  .   stroke: mapping our early stages of recovery book, , Does not apply, Once, Amie Portland, MD .  Margrett Rud labetalol (NORMODYNE,TRANDATE) injection 20 mg, 20 mg, Intravenous, Once, 20 mg at 07/17/18 1223 **AND** clevidipine (CLEVIPREX) infusion 0.5 mg/mL, 0-21 mg/hr, Intravenous, Continuous, Amie Portland, MD .  pantoprazole (PROTONIX) injection 40 mg, 40 mg, Intravenous, QHS, Amie Portland, MD .  senna-docusate (Senokot-S) tablet 1 tablet, 1 tablet, Oral, BID, Amie Portland, MD .  traMADol Veatrice Bourbon) tablet 50-100 mg, 50-100 mg, Oral, Q6H PRN, Amie Portland, MD  Current Outpatient Medications:  .  acetaminophen (TYLENOL) 325 MG tablet, Take 2 tablets (650 mg total) by mouth every 6 (six) hours as needed for mild pain (or Fever >/= 101)., Disp: , Rfl:  .  Blood Glucose Monitoring Suppl (ACCU-CHEK AVIVA) device, Use as instructed, Disp: 1 each, Rfl: 0 .  calcitRIOL (ROCALTROL) 0.25 MCG capsule, Take 1 capsule (0.25 mcg total) by mouth every other day., Disp: 30 capsule, Rfl: 0 .  feeding  supplement, GLUCERNA SHAKE, (GLUCERNA SHAKE) LIQD, Take 237 mLs by mouth 2 (two) times daily between meals. (Patient not taking: Reported on 04/26/2018), Disp: 60 Can, Rfl: 0 .  fluticasone (FLONASE) 50 MCG/ACT nasal spray, Place 2 sprays into both nostrils daily., Disp: 16 g, Rfl: 0 .  glucose blood (ACCU-CHEK AVIVA) test strip, Use twice per day., Disp: 60 each, Rfl: 5 .  hydrOXYzine (ATARAX/VISTARIL) 25 MG tablet, Take 1 tablet (25 mg total) by  mouth 3 (three) times daily as needed for itching., Disp: 30 tablet, Rfl: 0 .  insulin aspart (NOVOLOG) 100 UNIT/ML injection, Inject 0-9 Units into the skin 3 (three) times daily with meals., Disp: 10 mL, Rfl: 11 .  insulin glargine (LANTUS) 100 UNIT/ML injection, Inject 0.05 mLs (5 Units total) into the skin at bedtime., Disp: 10 mL, Rfl: 11 .  Insulin Syringe-Needle U-100 (INSULIN SYRINGE .3CC/31GX5/16") 31G X 5/16" 0.3 ML MISC, Inject 1 each into the skin 4 (four) times daily., Disp: 120 each, Rfl: 5 .  Lancets (ACCU-CHEK SOFT TOUCH) lancets, Use twice per day., Disp: 60 each, Rfl: 5 .  metoprolol tartrate (LOPRESSOR) 25 MG tablet, Take 1 tablet (25 mg total) by mouth 2 (two) times daily., Disp: 60 tablet, Rfl: 0 .  multivitamin (RENA-VIT) TABS tablet, Take 1 tablet by mouth at bedtime. (Patient not taking: Reported on 06/08/2018), Disp: 30 each, Rfl: 0 .  ondansetron (ZOFRAN) 4 MG tablet, Take 1 tablet (4 mg total) by mouth every 6 (six) hours as needed for nausea., Disp: 20 tablet, Rfl: 0 .  sodium chloride (OCEAN) 0.65 % SOLN nasal spray, Place 1 spray into both nostrils as needed for congestion., Disp: 1 Bottle, Rfl: 0 .  warfarin (COUMADIN) 2.5 MG tablet, Take 1 tablet (2.5 mg total) by mouth daily at 6 PM., Disp: 30 tablet, Rfl: 0 .  zolpidem (AMBIEN) 5 MG tablet, Take 1 tablet (5 mg total) by mouth at bedtime as needed for sleep., Disp: 30 tablet, Rfl: 0  Exam: Current vital signs: BP (!) 150/131   Pulse 67   Temp 98.8 F (37.1 C) (Oral)   Resp 14   Ht 5\' 1"  (1.549 m)   Wt 63.5 kg   SpO2 94%   BMI 26.45 kg/m  Vital signs in last 24 hours: Temp:  [98.8 F (37.1 C)] 98.8 F (37.1 C) (11/07 0920) Pulse Rate:  [67-86] 67 (11/07 1115) Resp:  [11-25] 14 (11/07 1215) BP: (150-190)/(98-131) 150/131 (11/07 1215) SpO2:  [94 %-100 %] 94 % (11/07 1115) Weight:  [63.5 kg] 63.5 kg (11/07 0921)  GENERAL: Awake, alert in NAD HEENT: - Normocephalic and atraumatic, dry mm, no LN++, no  Thyromegally, subconjunctival bleeding in both eyes LUNGS - Clear to auscultation bilaterally with no wheezes CV - S1S2 RRR, no m/r/g, equal pulses bilaterally. ABDOMEN - Soft, nontender, nondistended with normoactive BS Ext: warm, well perfused, intact peripheral pulses, no edema.  Left leg has scaling on the lower part.  NEURO:  Mental Status: AA&Ox3  Language: speech is non-dysarthric.  Naming, repetition, fluency, and comprehension intact. Cranial Nerves: PERRL . EOMI, visual fields full, no facial asymmetry, facial sensation intact, hearing intact, tongue/uvula/soft palate midline, normal sternocleidomastoid and trapezius muscle strength. No evidence of tongue atrophy or fibrillations Motor: 5/5 all over Tone: is normal and bulk is normal Sensation- Intact to light touch bilaterally Coordination: FTN intact bilaterally, no ataxia in BLE. Gait- deferred  NIHSS  0  ICH score-1 for IVH  Labs I have reviewed labs in epic  and the results pertinent to this consultation are:  CBC    Component Value Date/Time   WBC 4.4 07/17/2018 0935   RBC 6.22 (H) 07/17/2018 0935   HGB 13.0 07/17/2018 0935   HGB 8.0 (L) 03/27/2018 1107   HCT 45.9 07/17/2018 0935   HCT 25.3 (L) 03/27/2018 1107   PLT 124 (L) 07/17/2018 0935   PLT 126 (L) 03/27/2018 1107   MCV 73.8 (L) 07/17/2018 0935   MCV 83 03/27/2018 1107   MCH 20.9 (L) 07/17/2018 0935   MCHC 28.3 (L) 07/17/2018 0935   RDW 21.5 (H) 07/17/2018 0935   RDW 19.9 (H) 03/27/2018 1107   LYMPHSABS 1.2 07/17/2018 0935   LYMPHSABS 0.5 (L) 03/27/2018 1107   MONOABS 0.5 07/17/2018 0935   EOSABS 0.2 07/17/2018 0935   EOSABS 0.0 03/27/2018 1107   BASOSABS 0.2 (H) 07/17/2018 0935   BASOSABS 0.0 03/27/2018 1107   CMP     Component Value Date/Time   NA 141 07/17/2018 0935   NA 135 03/25/2018 1014   K 4.3 07/17/2018 0935   CL 110 07/17/2018 0935   CO2 23 07/17/2018 0935   GLUCOSE 103 (H) 07/17/2018 0935   BUN 32 (H) 07/17/2018 0935   BUN 96  (HH) 03/25/2018 1014   CREATININE 3.19 (H) 07/17/2018 0935   CALCIUM 9.1 07/17/2018 0935   PROT 6.0 (L) 06/20/2018 2042   ALBUMIN 2.6 (L) 06/24/2018 0421   AST 32 06/20/2018 2042   ALT 16 06/20/2018 2042   ALKPHOS 76 06/20/2018 2042   BILITOT 0.3 06/20/2018 2042   GFRNONAA 14 (L) 07/17/2018 0935   GFRAA 17 (L) 07/17/2018 0935  INR 3.40  Imaging I have reviewed the images obtained: CT-scan of the brain-very small hemorrhages in the inferior horn of the left lateral ventricle.  No hydrocephalus.  No other areas of bleed.  Assessment:  62/F with PMH as above, on coumadin for PE, presented for evaluation of headaches.  Noncontrast CT of the head shows a very small hemorrhage in the inferior horn of the left lateral ventricle with no hydrocephalus. Her INR is 3.4. At this time, we will reverse the Coumadin. We will reimage her in a day or 2 and if the bleed is stable resolving, can then resume anticoagulation, preferably DOAC.   Impression: Intraventricular hemorrhage -etiology likely hypertension/coagulopathy due to Coumadin/cocaine abuse  Plan: IVH, nontraumatic  Acuity: Acute Laterality: Left Treatment: -Admit to neurological ICU -ICH Score: 1 -ICH Volume: Less than 5 cc -BP control goal SYS<140 -PT/OT/ST  -neuromonitoring -Reversal of Coumadin with Kcentra  CNS -Close neuro monitoring  ?Dysphagia following ICH  -NPO until cleared by speech -ST -Continue PT/OT/ST  RESP History of pulmonary embolism August 2019, on Coumadin, INR 3.4. At this time due to acute bleed, will reverse Coumadin with Kcentra. Monitor respiratory status closely due to the history of PE Consider restarting anticoagulation after repeat head imaging shows resolving or stable bleed.  CV Essential (primary) hypertension Hypertensive Emergency -Aggressive BP control, goal SBP < 140 -Labetalol followed by Cleviprex  GI/GU ESRD -Continue dialysis -renal consult  HEME Coagulopathy due to  Coumadin -Reversed with Kcentra - Watch for any adverse reactions -Watch for any depressed respiratory status because of history of PE and now reversing the Coumadin because of the ICH/IVH  Anemia chronic disease -Monitor clinically and labs -Transfuse for hemoglobin less than 7..  ENDO Type 2 diabetes mellitus with hyperglycemia  -SSI -goal HgbA1c < 7  Fluid/Electrolyte Disorders -Replete -Repeat labs  P.o. until cleared  by swallow evaluation Full code No antiplatelet or anticoagulant-SCDs for DVT prophylaxis  Dispo: Home  -- Amie Portland, MD [facility] Pager: [phone] If 7pm to 7am, please call on call as listed on AMION.   CRITICAL CARE ATTESTATION This patient is critically ill and at significant risk of neurological worsening, death and care requires constant monitoring of vital signs, hemodynamics, respiratory, and cardiac monitoring. I spent 45  minutes of neurocritical care time performing neurological assessment, discussion with family, other specialists and medical decision making of high complexity in the care of  this patient.

## 2018-07-18 ENCOUNTER — Inpatient Hospital Stay (HOSPITAL_COMMUNITY): Payer: Medicare Other

## 2018-07-18 DIAGNOSIS — N186 End stage renal disease: Secondary | ICD-10-CM

## 2018-07-18 DIAGNOSIS — T82868A Thrombosis of vascular prosthetic devices, implants and grafts, initial encounter: Secondary | ICD-10-CM

## 2018-07-18 DIAGNOSIS — Z7901 Long term (current) use of anticoagulants: Secondary | ICD-10-CM

## 2018-07-18 DIAGNOSIS — I161 Hypertensive emergency: Secondary | ICD-10-CM

## 2018-07-18 DIAGNOSIS — Z86711 Personal history of pulmonary embolism: Secondary | ICD-10-CM

## 2018-07-18 DIAGNOSIS — F1410 Cocaine abuse, uncomplicated: Secondary | ICD-10-CM

## 2018-07-18 DIAGNOSIS — Z992 Dependence on renal dialysis: Secondary | ICD-10-CM

## 2018-07-18 LAB — CBC
HCT: 38.6 % (ref 36.0–46.0)
Hemoglobin: 11.4 g/dL — ABNORMAL LOW (ref 12.0–15.0)
MCH: 21.8 pg — ABNORMAL LOW (ref 26.0–34.0)
MCHC: 29.5 g/dL — ABNORMAL LOW (ref 30.0–36.0)
MCV: 73.7 fL — ABNORMAL LOW (ref 80.0–100.0)
Platelets: 120 10*3/uL — ABNORMAL LOW (ref 150–400)
RBC: 5.24 MIL/uL — ABNORMAL HIGH (ref 3.87–5.11)
RDW: 21.3 % — ABNORMAL HIGH (ref 11.5–15.5)
WBC: 5.1 K/uL (ref 4.0–10.5)
nRBC: 0 % (ref 0.0–0.2)

## 2018-07-18 LAB — RENAL FUNCTION PANEL
Albumin: 2.6 g/dL — ABNORMAL LOW (ref 3.5–5.0)
Anion gap: 4 — ABNORMAL LOW (ref 5–15)
BUN: 33 mg/dL — ABNORMAL HIGH (ref 8–23)
CO2: 25 mmol/L (ref 22–32)
Calcium: 8.4 mg/dL — ABNORMAL LOW (ref 8.9–10.3)
Chloride: 107 mmol/L (ref 98–111)
Creatinine, Ser: 3.22 mg/dL — ABNORMAL HIGH (ref 0.44–1.00)
GFR calc Af Amer: 16 mL/min — ABNORMAL LOW (ref >60)
GFR calc non Af Amer: 14 mL/min — ABNORMAL LOW (ref >60)
Glucose, Bld: 110 mg/dL — ABNORMAL HIGH (ref 70–99)
Phosphorus: 5.2 mg/dL — ABNORMAL HIGH (ref 2.5–4.6)
Potassium: 4.8 mmol/L (ref 3.5–5.1)
Sodium: 136 mmol/L (ref 135–145)

## 2018-07-18 LAB — PROTIME-INR
INR: 0.99
Prothrombin Time: 13 seconds (ref 11.4–15.2)

## 2018-07-18 LAB — GLUCOSE, CAPILLARY
GLUCOSE-CAPILLARY: 151 mg/dL — AB (ref 70–99)
GLUCOSE-CAPILLARY: 101 mg/dL — AB (ref 70–99)
GLUCOSE-CAPILLARY: 128 mg/dL — AB (ref 70–99)

## 2018-07-18 MED ORDER — LABETALOL HCL 5 MG/ML IV SOLN: 10.0000 mg | INTRAVENOUS | Status: DC | PRN

## 2018-07-18 MED ORDER — CALCITRIOL 0.25 MCG PO CAPS
ORAL_CAPSULE | ORAL | Status: AC
  Filled 2018-07-18: qty 1

## 2018-07-18 MED ORDER — SODIUM CHLORIDE 0.9 % IV SOLN: 100.0000 mL | INTRAVENOUS | Status: DC | PRN

## 2018-07-18 MED ORDER — TRAMADOL HCL 50 MG PO TABS
50.0000 mg | ORAL_TABLET | Freq: Four times a day (QID) | ORAL | Status: DC | PRN
  Administered 2018-07-18 – 2018-07-23 (×8): 50 mg via ORAL
  Filled 2018-07-18 (×8): qty 1

## 2018-07-18 MED ORDER — PANTOPRAZOLE SODIUM 40 MG PO TBEC
40.0000 mg | DELAYED_RELEASE_TABLET | Freq: Every day | ORAL | Status: DC
  Administered 2018-07-18 – 2018-07-23 (×6): 40 mg via ORAL
  Filled 2018-07-18 (×6): qty 1

## 2018-07-18 MED ORDER — METOPROLOL TARTRATE 25 MG PO TABS: 25.0000 mg | ORAL_TABLET | Freq: Two times a day (BID) | ORAL | Status: DC

## 2018-07-18 MED ORDER — FLUTICASONE PROPIONATE 50 MCG/ACT NA SUSP
2.0000 | Freq: Every day | NASAL | Status: DC
  Administered 2018-07-18 – 2018-07-21 (×4): 2 via NASAL
  Filled 2018-07-18: qty 16

## 2018-07-18 MED ORDER — PENTAFLUOROPROP-TETRAFLUOROETH EX AERO: 1.0000 "application " | INHALATION_SPRAY | CUTANEOUS | Status: DC | PRN

## 2018-07-18 MED ORDER — CALCITRIOL 0.25 MCG PO CAPS
0.2500 ug | ORAL_CAPSULE | ORAL | Status: DC
  Administered 2018-07-18 – 2018-07-22 (×3): 0.25 ug via ORAL
  Filled 2018-07-18 (×3): qty 1

## 2018-07-18 MED ORDER — LIDOCAINE HCL (PF) 1 % IJ SOLN: 5.0000 mL | INTRAMUSCULAR | Status: DC | PRN

## 2018-07-18 MED ORDER — LIDOCAINE-PRILOCAINE 2.5-2.5 % EX CREA: 1.0000 "application " | TOPICAL_CREAM | CUTANEOUS | Status: DC | PRN

## 2018-07-18 MED ORDER — HYDRALAZINE HCL 25 MG PO TABS
25.0000 mg | ORAL_TABLET | Freq: Three times a day (TID) | ORAL | Status: DC
  Administered 2018-07-19 – 2018-07-23 (×9): 25 mg via ORAL
  Filled 2018-07-18 (×10): qty 1

## 2018-07-18 MED ORDER — GADOBUTROL 1 MMOL/ML IV SOLN
7.5000 mL | Freq: Once | INTRAVENOUS | Status: AC | PRN
  Administered 2018-07-18: 6 mL via INTRAVENOUS

## 2018-07-18 MED ORDER — CALCITRIOL 0.25 MCG PO CAPS
0.2500 ug | ORAL_CAPSULE | ORAL | Status: DC
  Administered 2018-07-18: 0.25 ug via ORAL
  Filled 2018-07-18: qty 1

## 2018-07-18 MED ORDER — RENA-VITE PO TABS
1.0000 | ORAL_TABLET | Freq: Every day | ORAL | Status: DC
  Administered 2018-07-19 – 2018-07-22 (×5): 1 via ORAL
  Filled 2018-07-18 (×6): qty 1

## 2018-07-18 MED ORDER — AMLODIPINE BESYLATE 10 MG PO TABS
10.0000 mg | ORAL_TABLET | Freq: Every day | ORAL | Status: DC
  Administered 2018-07-18 – 2018-07-23 (×5): 10 mg via ORAL
  Filled 2018-07-18 (×6): qty 1

## 2018-07-18 NOTE — Evaluation (Signed)
Speech Language Pathology Evaluation Patient Details Name: Rhonda Lynch MRN: [id number] DOB: 1956-07-02 Today's Date: 07/18/2018 Time: 7026-3785 SLP Time Calculation (min) (ACUTE ONLY): 12 min  Problem List:  Patient Active Problem List   Diagnosis Date Noted  . ICH (intracerebral hemorrhage) ([place]) 07/17/2018  . HCAP (healthcare-associated pneumonia) 06/21/2018  . Substance induced mood disorder ([place]) 05/15/2018  . Agitation   . Hemodialysis catheter infection, initial encounter ([place])   . 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 Pulmonary embolism ([place]) 04/27/2018  . Sepsis ([place]) 04/26/2018  . Dyspnea 04/26/2018  . Chest pain 04/26/2018  . Tachycardia 04/26/2018  . Abdominal pain 04/18/2018  . DM (diabetes mellitus), type 2 with renal complications ([place]) 88/50/2774  . Right flank pain 04/18/2018  . Acute respiratory failure with hypoxia ([place]) 04/12/2018  . SVT (supraventricular tachycardia) ([place])   . Chronic renal failure   . Diabetes ([place]) 04/02/2018  . Hypertension   . Noncompliance of patient with renal dialysis ([place])   . Acute pulmonary edema ([facility])   . Elevated troponin 03/19/2018  . AKI (acute kidney injury) ([place])   . Fever   . 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 Diffuse pulmonary alveolar hemorrhage   . Hypoxemia   . Pulmonary infiltrate   . 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 ESRD (end stage renal disease) ([place]) 02/12/2018  . Acute respiratory failure ([place]) 02/12/2018  . Alcohol abuse 02/12/2018  . Anemia 02/12/2018  . Cocaine abuse ([place]) 02/12/2018  . Epigastric pain 02/12/2018  . Pneumonia 02/12/2018  . TOBACCO ABUSE 03/08/2010  . PNEUMONIA 03/08/2010  . UTI 03/08/2010  . ELEVATED BLOOD PRESSURE WITHOUT DIAGNOSIS OF HYPERTENSION 03/08/2010   Past Medical History:  Past Medical History:  Diagnosis Date  . Chronic kidney disease   . Diabetes mellitus without complication ([place])   . ESRD (end stage renal disease) ([place])   . Hypertension   .  Oxygen deficiency    2 L at night  . Substance abuse ([place])    has been clean for 4 months    Past Surgical History:  Past Surgical History:  Procedure Laterality Date  . AV FISTULA PLACEMENT Left 03/05/2018   Procedure: ARTERIOVENOUS (AV) FISTULA CREATION  LEFT UPPER EXTREMITY;  Surgeon: [name], MD;  Location: [facility];  Service: Vascular;  Laterality: Left;  . HEMATOMA EVACUATION Left 03/06/2018   Procedure: EVACUATION HEMATOMA LEFT ARM;  Surgeon: Angelia Mould, MD;  Location: [facility];  Service: Vascular;  Laterality: Left;  . INSERTION OF DIALYSIS CATHETER Left 04/29/2018   Procedure: INSERTION OF DIALYSIS CATHETER LEFT INTERNAL JUGULAR;  Surgeon: Angelia Mould, MD;  Location: [facility];  Service: Vascular;  Laterality: Left;  . IR FLUORO GUIDE CV LINE RIGHT  02/18/2018  . IR FLUORO GUIDE CV LINE RIGHT  02/26/2018  . IR REMOVAL TUN CV CATH W/O FL  04/27/2018  . IR US GUIDE VASC ACCESS RIGHT  02/18/2018  . TEE WITHOUT CARDIOVERSION N/A 04/29/2018   Procedure: TRANSESOPHAGEAL ECHOCARDIOGRAM (TEE);  Surgeon: Sueanne Margarita, MD;  Location: [facility] ENDOSCOPY;  Service: Cardiovascular;  Laterality: N/A;   HPI:  62 y.o. female past medical history of diabetes, ESRD on HD, depression and suicidality,, hypertension noncompliant to medications, substance abuse with last cocaine use according to her 1 month ago, PE on Coumadin, presenting for evaluation of unremitting headaches for 3 to 4 weeks.  CT of the head was performed that showed a small bleed in the inferior horn of the left lateral ventricle  Assessment / Plan / Recommendation Clinical Impression  Pt presents with cognitive-communicative function that is WNL - speech is clear and fluent; expressive and receptive language are intact.  Recall, awareness, and executive functioning are [facility].  No SLP f/u is needed - our service will sign off.     SLP Assessment  SLP Recommendation/Assessment: Patient does not need any further Speech  [place] [facility]    Follow Up Recommendations  None ; no SLP follow up   Frequency and Duration           SLP Evaluation Cognition  Overall Cognitive Status: Within Functional Limits for tasks assessed Arousal/Alertness: Awake/alert Orientation Level: Oriented X4 Attention: Selective Selective Attention: Appears intact Memory: Appears intact Awareness: Appears intact Problem Solving: Appears intact Safety/Judgment: Appears intact       Comprehension  Auditory Comprehension Overall Auditory Comprehension: Appears within functional limits for tasks assessed Reading Comprehension Reading Status: Within funtional limits    Expression Expression Primary Mode of Expression: Verbal Verbal Expression Overall Verbal Expression: Appears within functional limits for tasks assessed Written Expression Dominant Hand: Right   Oral / Motor  Oral Motor/Sensory Function Overall Oral Motor/Sensory Function: Within functional limits Motor Speech Overall Motor Speech: Appears within functional limits for tasks assessed   GO                    [name] 07/18/2018, 11:02 AM Estill Bamberg L. Tivis Ringer, [place] Office number [phone] Pager [phone]

## 2018-07-18 NOTE — Consult Note (Signed)
[facility] Renal Consultation Note    Indication for Consultation:  Management of ESRD/hemodialysis; anemia, hypertension/volume and secondary hyperparathyroidism PCP:  HPI: Rhonda Lynch is a 62 y.o. female with ESRD on hemodialysis T,Th,S at [facility]. Last HD 07/08/2018 signed off early 2 hrs 43 min of 4 hours. PMH HTN, DM, PE on coumadin, HFpEF, polysubstance abuse, fungemia (C parapsilosis), AOCD, SHPT, noncompliance with HD.   Patient presented to ED 07/17/2018 with 3-4 week history of headaches, blurred vision (Says this is the reason she did not attend hemodialysis). CT of head revealed new small hemorrhage which appears to be deep in the inferior horn of the left lateral ventricle, without mass, midline shift or hydrocephalus. UDA positive for cocaine. INR 3.40, labs otherwise reasonable for ESRD who missed HD for one week. SCr 3.19 K+ 4.3 BUN 32 EGFR 17. She has been admitted for [place] per neurology.   Currently she denies headache. BP well controlled, Cleviprix weaned and DC'd. She will have HD today off schedule and again tomorrow to get back on schedule. Discussed compliance with HD and substance abuse. Offered to assist her obtaining treatment for cocaine abuse which at the moment, she says she would like to do.   Past Medical History:  Diagnosis Date  . Chronic kidney disease   . Diabetes mellitus without complication ([place])   . ESRD (end stage renal disease) ([place])   . Hypertension   . Oxygen deficiency    2 L at night  . Substance abuse ([place])    has been clean for 4 months    Past Surgical History:  Procedure Laterality Date  . AV FISTULA PLACEMENT Left 03/05/2018   Procedure: ARTERIOVENOUS (AV) FISTULA CREATION  LEFT UPPER EXTREMITY;  Surgeon: [name], MD;  Location: [place];  Service: Vascular;  Laterality: Left;  . HEMATOMA EVACUATION Left 03/06/2018   Procedure: EVACUATION HEMATOMA LEFT ARM;  Surgeon: Angelia Mould, MD;   Location: [place];  Service: Vascular;  Laterality: Left;  . INSERTION OF DIALYSIS CATHETER Left 04/29/2018   Procedure: INSERTION OF DIALYSIS CATHETER LEFT INTERNAL JUGULAR;  Surgeon: Angelia Mould, MD;  Location: [place];  Service: Vascular;  Laterality: Left;  . IR FLUORO GUIDE CV LINE RIGHT  02/18/2018  . IR FLUORO GUIDE CV LINE RIGHT  02/26/2018  . IR REMOVAL TUN CV CATH W/O FL  04/27/2018  . IR US GUIDE VASC ACCESS RIGHT  02/18/2018  . TEE WITHOUT CARDIOVERSION N/A 04/29/2018   Procedure: TRANSESOPHAGEAL ECHOCARDIOGRAM (TEE);  Surgeon: Sueanne Margarita, MD;  Location: [facility] ENDOSCOPY;  Service: Cardiovascular;  Laterality: N/A;   Family History  Problem Relation Age of Onset  . Autoimmune disease Neg Hx    Social History:  reports that she quit smoking about 3 months ago. She smoked 0.50 packs per day. She has never used smokeless tobacco. She reports that she drinks alcohol. She reports that she has current or past drug history. Drugs: Cocaine and Heroin. Allergies  Allergen Reactions  . Acetaminophen Nausea And Vomiting    Patient states Acetaminophen and Acetaminophen containing products make her nauseated. She demonstrated this 06/21/18 with nausea followed by emesis.   Prior to Admission medications   Medication Sig Start Date End Date Taking? Authorizing Provider  calcitRIOL (ROCALTROL) 0.25 MCG capsule Take 1 capsule (0.25 mcg total) by mouth every other day. 04/14/18  Yes Nita Sells, MD  fluticasone (FLONASE) 50 MCG/ACT nasal spray Place 2 sprays into both nostrils daily. 06/24/18  Yes Lavina Hamman, MD  hydrOXYzine (ATARAX/VISTARIL) 25 MG tablet Take 1 tablet (25 mg total) by mouth 3 (three) times daily as needed for itching. 06/23/18  Yes Lavina Hamman, MD  insulin aspart (NOVOLOG) 100 UNIT/ML injection Inject 0-9 Units into the skin 3 (three) times daily with meals. Patient taking differently: Inject 5 Units into the skin 3 (three) times daily with meals.  04/13/18   Yes Nita Sells, MD  insulin glargine (LANTUS) 100 UNIT/ML injection Inject 0.05 mLs (5 Units total) into the skin at bedtime. 04/13/18  Yes Nita Sells, MD  metoprolol tartrate (LOPRESSOR) 25 MG tablet Take 1 tablet (25 mg total) by mouth 2 (two) times daily. 05/02/18  Yes Shelly Coss, MD  multivitamin (RENA-VIT) TABS tablet Take 1 tablet by mouth at bedtime. 04/13/18  Yes Nita Sells, MD  warfarin (COUMADIN) 2.5 MG tablet Take 1 tablet (2.5 mg total) by mouth daily at 6 PM. 05/03/18  Yes Adhikari, Amrit, MD  zolpidem (AMBIEN) 5 MG tablet Take 1 tablet (5 mg total) by mouth at bedtime as needed for sleep. 04/13/18  Yes Nita Sells, MD  acetaminophen (TYLENOL) 325 MG tablet Take 2 tablets (650 mg total) by mouth every 6 (six) hours as needed for mild pain (or Fever >/= 101). Patient not taking: Reported on 07/17/2018 04/24/18   Jonetta Osgood, MD  Blood Glucose Monitoring Suppl (ACCU-CHEK AVIVA) device Use as instructed 06/05/18 06/05/19  Clent Demark, PA-C  feeding supplement, GLUCERNA SHAKE, (GLUCERNA SHAKE) LIQD Take 237 mLs by mouth 2 (two) times daily between meals. Patient not taking: Reported on 04/26/2018 04/24/18   Jonetta Osgood, MD  glucose blood (ACCU-CHEK AVIVA) test strip Use twice per day. 06/05/18   Clent Demark, PA-C  Insulin Syringe-Needle U-100 (INSULIN SYRINGE .3CC/31GX5/16") 31G X 5/16" 0.3 ML MISC Inject 1 each into the skin 4 (four) times daily. 06/27/18   Clent Demark, PA-C  Lancets (ACCU-CHEK SOFT [facility]) lancets Use twice per day. 06/05/18   Clent Demark, PA-C  ondansetron (ZOFRAN) 4 MG tablet Take 1 tablet (4 mg total) by mouth every 6 (six) hours as needed for nausea. Patient not taking: Reported on 07/17/2018 04/13/18   Nita Sells, MD  sodium chloride (OCEAN) 0.65 % SOLN nasal spray Place 1 spray into both nostrils as needed for congestion. Patient not taking: Reported on 07/17/2018 06/24/18   Lavina Hamman,  MD   Current Facility-Administered Medications  Medication Dose Route Frequency Provider Last Rate Last Dose  . amLODipine (NORVASC) tablet 10 mg  10 mg Oral Daily Rosalin Hawking, MD   10 mg at 07/18/18 1222  . calcitRIOL (ROCALTROL) capsule 0.25 mcg  0.25 mcg Oral Gennette Pac, MD   0.25 mcg at 07/18/18 1226  . Chlorhexidine Gluconate Cloth 2 % PADS 6 each  6 each Topical Daily Amie Portland, MD   6 each at 07/18/18 1222  . clevidipine (CLEVIPREX) infusion 0.5 mg/mL  0-21 mg/hr Intravenous Continuous Rosalin Hawking, MD 6 mL/hr at 07/18/18 1200 3 mg/hr at 07/18/18 1200  . fluticasone (FLONASE) 50 MCG/ACT nasal spray 2 spray  2 spray Each Nare Daily Rosalin Hawking, MD   2 spray at 07/18/18 1226  . hydrALAZINE (APRESOLINE) tablet 25 mg  25 mg Oral Q8H Rosalin Hawking, MD      . insulin aspart (novoLOG) injection 0-9 Units  0-9 Units Subcutaneous TID WC Amie Portland, MD   1 Units at 07/18/18 1226  . multivitamin (RENA-VIT) tablet 1 tablet  1 tablet Oral QHS Rosalin Hawking, MD      . pantoprazole (PROTONIX) EC tablet 40 mg  40 mg Oral Daily Rosalin Hawking, MD   40 mg at 07/18/18 1221  . senna-docusate (Senokot-S) tablet 1 tablet  1 tablet Oral BID Amie Portland, MD   1 tablet at 07/18/18 1023  . sodium chloride flush (NS) 0.9 % injection 10-40 mL  10-40 mL Intracatheter Q12H Amie Portland, MD   10 mL at 07/18/18 1024  . sodium chloride flush (NS) 0.9 % injection 10-40 mL  10-40 mL Intracatheter PRN Amie Portland, MD      . traMADol Veatrice Bourbon) tablet 50-100 mg  50-100 mg Oral Q6H PRN Amie Portland, MD   100 mg at 07/18/18 1023   Labs: Basic Metabolic Panel: Recent Labs  Lab 07/17/18 0935  NA 141  K 4.3  CL 110  CO2 23  GLUCOSE 103*  BUN 32*  CREATININE 3.19*  CALCIUM 9.1   Liver Function Tests: No results for input(s): AST, ALT, ALKPHOS, BILITOT, PROT, ALBUMIN in the last 168 hours. No results for input(s): LIPASE, AMYLASE in the last 168 hours. No results for input(s): AMMONIA in the last 168  hours. CBC: Recent Labs  Lab 07/17/18 0935  WBC 4.4  NEUTROABS 2.2  HGB 13.0  HCT 45.9  MCV 73.8*  PLT 124*   Cardiac Enzymes: No results for input(s): CKTOTAL, CKMB, CKMBINDEX, TROPONINI in the last 168 hours. CBG: Recent Labs  Lab 07/17/18 1652  GLUCAP 157*   Iron Studies: No results for input(s): IRON, TIBC, TRANSFERRIN, FERRITIN in the last 72 hours. Studies/Results: Ct Angio Head W Or Wo Contrast  Result Date: 07/17/2018 CLINICAL DATA:  Followup intracranial hemorrhage EXAM: CT ANGIOGRAPHY HEAD AND NECK TECHNIQUE: Multidetector CT imaging of the head and neck was performed using the standard protocol during bolus administration of intravenous contrast. Multiplanar CT image reconstructions and MIPs were obtained to evaluate the vascular anatomy. Carotid stenosis measurements (when applicable) are obtained utilizing NASCET criteria, using the distal internal carotid diameter as the denominator. CONTRAST:  163m ISOVUE-370 IOPAMIDOL (ISOVUE-370) INJECTION 76% COMPARISON:  CT earlier same day FINDINGS: CTA NECK FINDINGS Aortic arch: Aortic atherosclerosis. No aneurysm or dissection. Brachiocephalic branching pattern is normal without origin stenosis. Right carotid system: Common carotid artery is tortuous but widely patent to the bifurcation. The carotid bifurcation is normal without atherosclerotic plaque, narrowing or irregularity. Cervical ICA is tortuous but widely patent. Left carotid system: Common carotid artery is widely patent to the bifurcation. The carotid bifurcation is normal without soft or calcified plaque. Cervical ICA is tortuous but widely patent. Vertebral arteries: Both subclavian arteries are widely patent. Both vertebral artery origins are widely patent. Both vertebral arteries are widely patent through the cervical region to the foramen magnum. Skeleton: Cervical spondylosis.  No acute bone finding. Other neck: No mass or lymphadenopathy. Upper chest: Mild scarring.   No active process. Review of the MIP images confirms the above findings CTA HEAD FINDINGS Anterior circulation: Both internal carotid arteries are patent through the skull base and siphon regions. There is atherosclerotic calcification in the carotid siphon regions but no stenosis more than about 20%. The anterior and middle cerebral vessels are patent without proximal stenosis, aneurysm or vascular malformation. There are large posterior communicating arteries on each side. Posterior circulation: Both vertebral arteries are patent through the foramen magnum to the basilar. Posterior inferior cerebellar arteries appear normal. There is no basilar stenosis. Superior cerebellar and posterior cerebral arteries are patent and  normal. PCAs receive most of there supply from the anterior circulation. No evidence of abnormal arterial structure in the left hippocampal region. Venous sinuses: Patent and normal. Anatomic variants: None significant. Delayed phase: No abnormal enhancement in the region of the 8-9 mm hemorrhage in the left hippocampal region. No abnormal brain enhancement elsewhere. Review of the MIP images confirms the above findings IMPRESSION: No significant large or medium vessel pathology. No evidence of atherosclerotic narrowing. The vessels are tortuous as might be seen with hypertension. There is nonstenotic atherosclerotic calcification in the carotid siphon regions. No evidence of abnormal arterial or venous structure in the region of the left hippocampus where there is an intraparenchymal hemorrhage measuring 8-9 mm. Electronically Signed   By: Nelson Chimes M.D.   On: 07/17/2018 14:19   Ct Head Wo Contrast  Result Date: 07/17/2018 CLINICAL DATA:  Headache and left eye pain for 3 weeks. EXAM: CT HEAD WITHOUT CONTRAST TECHNIQUE: Contiguous axial images were obtained from the base of the skull through the vertex without intravenous contrast. COMPARISON:  Head CT scan 06/21/2018. FINDINGS: Brain: The  patient has a new small hemorrhage which appears to be deep in the inferior horn of the left lateral ventricle. No mass, hydrocephalus, mass effect or midline shift is identified. No evidence of acute infarct is seen. Vascular: Atherosclerosis is noted. Skull: Intact.  No focal lesion. Sinuses/Orbits: Negative. Other: None. IMPRESSION: Small hemorrhage deep in the inferior horn of the left lateral ventricle. Cause for this abnormality is not identified but note is made that the patient is anticoagulated. Brain MRI and MRI for further evaluation recommended. Critical Value/emergent results were called by telephone at the time of interpretation on 07/17/2018 at 11:14 am to [place], P. A., who verbally acknowledged these results. Electronically Signed   By: Inge Rise M.D.   On: 07/17/2018 11:17   Ct Angio Neck W Or Wo Contrast  Result Date: 07/17/2018 CLINICAL DATA:  Followup intracranial hemorrhage EXAM: CT ANGIOGRAPHY HEAD AND NECK TECHNIQUE: Multidetector CT imaging of the head and neck was performed using the standard protocol during bolus administration of intravenous contrast. Multiplanar CT image reconstructions and MIPs were obtained to evaluate the vascular anatomy. Carotid stenosis measurements (when applicable) are obtained utilizing NASCET criteria, using the distal internal carotid diameter as the denominator. CONTRAST:  124m ISOVUE-370 IOPAMIDOL (ISOVUE-370) INJECTION 76% COMPARISON:  CT earlier same day FINDINGS: CTA NECK FINDINGS Aortic arch: Aortic atherosclerosis. No aneurysm or dissection. Brachiocephalic branching pattern is normal without origin stenosis. Right carotid system: Common carotid artery is tortuous but widely patent to the bifurcation. The carotid bifurcation is normal without atherosclerotic plaque, narrowing or irregularity. Cervical ICA is tortuous but widely patent. Left carotid system: Common carotid artery is widely patent to the bifurcation. The carotid bifurcation  is normal without soft or calcified plaque. Cervical ICA is tortuous but widely patent. Vertebral arteries: Both subclavian arteries are widely patent. Both vertebral artery origins are widely patent. Both vertebral arteries are widely patent through the cervical region to the foramen magnum. Skeleton: Cervical spondylosis.  No acute bone finding. Other neck: No mass or lymphadenopathy. Upper chest: Mild scarring.  No active process. Review of the MIP images confirms the above findings CTA HEAD FINDINGS Anterior circulation: Both internal carotid arteries are patent through the skull base and siphon regions. There is atherosclerotic calcification in the carotid siphon regions but no stenosis more than about 20%. The anterior and middle cerebral vessels are patent without proximal stenosis, aneurysm or vascular malformation.  There are large posterior communicating arteries on each side. Posterior circulation: Both vertebral arteries are patent through the foramen magnum to the basilar. Posterior inferior cerebellar arteries appear normal. There is no basilar stenosis. Superior cerebellar and posterior cerebral arteries are patent and normal. PCAs receive most of there supply from the anterior circulation. No evidence of abnormal arterial structure in the left hippocampal region. Venous sinuses: Patent and normal. Anatomic variants: None significant. Delayed phase: No abnormal enhancement in the region of the 8-9 mm hemorrhage in the left hippocampal region. No abnormal brain enhancement elsewhere. Review of the MIP images confirms the above findings IMPRESSION: No significant large or medium vessel pathology. No evidence of atherosclerotic narrowing. The vessels are tortuous as might be seen with hypertension. There is nonstenotic atherosclerotic calcification in the carotid siphon regions. No evidence of abnormal arterial or venous structure in the region of the left hippocampus where there is an intraparenchymal  hemorrhage measuring 8-9 mm. Electronically Signed   By: Nelson Chimes M.D.   On: 07/17/2018 14:19   [name] AJ Contrast  Result Date: 07/18/2018 CLINICAL DATA:  Headaches. On anti coagulation. Left hippocampal hemorrhage. EXAM: MRI HEAD WITHOUT AND WITH CONTRAST TECHNIQUE: Multiplanar, multiecho pulse sequences of the brain and surrounding structures were obtained without and with intravenous contrast. CONTRAST:  6 mL Gadavist COMPARISON:  CTA head neck 07/17/2018 FINDINGS: BRAIN: There is a small focus of magnetic susceptibility effect in the area of the left optic tract and tail of the caudate nucleus. This shows very low T2-weighted signal and intermediate T1-weighted signal. The focus measures 6-8 mm. No acute ischemia. There is no associated contrast enhancement. Minimal surrounding edema. The midline structures are normal. No midline shift or other mass effect. There are no old infarcts. Multifocal white matter hyperintensity, most commonly due to chronic ischemic microangiopathy. The cerebral and cerebellar volume are age-appropriate. Susceptibility-sensitive sequences show no chronic microhemorrhage or superficial siderosis. No abnormal contrast enhancement. VASCULAR: Major intracranial arterial and venous sinus flow voids are normal. SKULL AND UPPER CERVICAL SPINE: Calvarial bone marrow signal is normal. There is no skull base mass. Visualized upper cervical spine and soft tissues are normal. SINUSES/ORBITS: No fluid levels or advanced mucosal thickening. No mastoid or middle ear effusion. The orbits are normal. IMPRESSION: 1. Subcentimeter focus of hemorrhage, likely early subacute, within the tail of the left caudate nucleus, in close proximity to the left optic tract. No underlying lesion is identified. This may be a hypertensive hemorrhage in the setting of anticoagulation. 2. Mild findings of chronic small vessel ischemia. Electronically Signed   By: Ulyses Jarred M.D.   On: 07/18/2018 03:20     ROS: As per HPI otherwise negative.   Physical Exam: Vitals:   07/18/18 1130 07/18/18 1145 07/18/18 1200 07/18/18 1202  BP: 130/85 129/75 126/76   Pulse: 77 76 70   Resp: '14 13 13   ' Temp:    98.3 F (36.8 C)  TempSrc:    Oral  SpO2: 100% 100% 100%   Weight:      Height:         General: Chronically ill appearing female-looks older than stated age in no acute distress. Head: Normocephalic, atraumatic, sclera non-icteric, mucus membranes are moist Neck: Supple. JVD not elevated. Lungs: Clear bilaterally to auscultation without wheezes, rales, or rhonchi. Breathing is unlabored. Heart: RRR with S1 S2. No murmurs, rubs, or gallops appreciated. Abdomen: Soft, non-tender, non-distended with normoactive bowel sounds. No rebound/guarding. No obvious abdominal masses. M-S:  Strength and tone appear normal for age. Lower extremities:without edema or ischemic changes, no open wounds  Neuro: Alert and oriented X 3. Moves all extremities spontaneously. Psych:  Responds to questions appropriately with a normal affect. Dialysis Access: LUA AVF no bruit. RIJ TDC Drsg CDI-changed by staff RN D/T heavy soiling.   Dialysis Orders: [place] T,Th,S 4 hrs 180NRe 400/Autoflow1.5 59 kg 2.0 K /2.5 Ca UFP 2 -Heparin 2000 units IV TIW (This will need to be Dc'd on discharge!) -Venofer 100 mg IV X 10 doses (has not been started yet Fe 48 Tsat 18 07/08/2018) -Calcitriol 0.25 mcg PO TIW  Assessment/Plan: 1.  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 2.  Noncompliance with HD. Missed HD X 4 treatments No evidence of uremia or volume overload. EGFR 17. HD today and again tomorrow to get back on schedule 3.  Polysubstance Abuse-UDA positive for cocaine.  4.  Failed LUA AVF-Placed in 03/05/2018 per Dr. Donnetta Hutching. Never cannulated. Consult VVS.  5.  ESRD -  T,Th,S HD today and again tomorrow to get back on schedule. 6.  Hypertension/volume  -  Hypertensive on adm, just weaned off clevaprix. Unfortunately rec'd amlodipine prior to [place] clevaprix so dialysis today may be impeded by hypotension. No BB-recent cocaine use. On hydralazine and amlodipine.  7.  Anemia  -HGB 13. Hold OP iron load. Follow HGB.   8.  Metabolic bone disease -  Renal function panel ordered in HD today. Ca 9.1. No binders on OP med list. Cont VDRA.  9.  Nutrition - Changed to Renal/Carb mod diet, renal vit 10. DM-per primary 11. Polysubstance abuse   H. Owens Shark, NP-C 07/18/2018, 12:39 PM  [employer] [phone]

## 2018-07-18 NOTE — Consult Note (Signed)
Hospital Consult    Reason for Consult:  Thrombosed LUE brachiocephalic AVF Referring Physician:  Nephrology MRN #:  [id number]  History of Present Illness: This is a 62 y.o. female with hx substance abuse, ESRD on HD, HTN that has been admitted to neuro ICU with ICH and vascular was called to evaluate for dialysis access given concern for thrombosed LUE brachiocephalic AVF.  Fistula was placed in 02/2018 by Dr. Donnetta Hutching.  Was most recently seen by Dr. Scot Dock in 04/2018 when fistula was poorly functioning and had limited flow volumes on duplex and TDC was placed.  No thrill or bruit in fistula per nephrology.  Unknown length fistula has been down since patient poorly compliant and fistula never accessed.   Past Medical History:  Diagnosis Date  . Chronic kidney disease   . Diabetes mellitus without complication ([place])   . ESRD (end stage renal disease) ([place])   . Hypertension   . Oxygen deficiency    2 L at night  . Substance abuse ([place])    has been clean for 4 months     Past Surgical History:  Procedure Laterality Date  . AV FISTULA PLACEMENT Left 03/05/2018   Procedure: ARTERIOVENOUS (AV) FISTULA CREATION  LEFT UPPER EXTREMITY;  Surgeon: [name], MD;  Location: [place];  Service: Vascular;  Laterality: Left;  . HEMATOMA EVACUATION Left 03/06/2018   Procedure: EVACUATION HEMATOMA LEFT ARM;  Surgeon: Angelia Mould, MD;  Location: [place];  Service: Vascular;  Laterality: Left;  . INSERTION OF DIALYSIS CATHETER Left 04/29/2018   Procedure: INSERTION OF DIALYSIS CATHETER LEFT INTERNAL JUGULAR;  Surgeon: Angelia Mould, MD;  Location: [place];  Service: Vascular;  Laterality: Left;  . IR FLUORO GUIDE CV LINE RIGHT  02/18/2018  . IR FLUORO GUIDE CV LINE RIGHT  02/26/2018  . IR REMOVAL TUN CV CATH W/O FL  04/27/2018  . IR US GUIDE VASC ACCESS RIGHT  02/18/2018  . TEE WITHOUT CARDIOVERSION N/A 04/29/2018   Procedure: TRANSESOPHAGEAL ECHOCARDIOGRAM (TEE);  Surgeon: Sueanne Margarita,  MD;  Location: [facility];  Service: Cardiovascular;  Laterality: N/A;    Allergies  Allergen Reactions  . Acetaminophen Nausea And Vomiting    Patient states Acetaminophen and Acetaminophen containing products make her nauseated. She demonstrated this 06/21/18 with nausea followed by emesis.    Prior to Admission medications   Medication Sig Start Date End Date Taking? Authorizing Provider  calcitRIOL (ROCALTROL) 0.25 MCG capsule Take 1 capsule (0.25 mcg total) by mouth every other day. 04/14/18  Yes Nita Sells, MD  fluticasone (FLONASE) 50 MCG/ACT nasal spray Place 2 sprays into both nostrils daily. 06/24/18  Yes Lavina Hamman, MD  hydrOXYzine (ATARAX/VISTARIL) 25 MG tablet Take 1 tablet (25 mg total) by mouth 3 (three) times daily as needed for itching. 06/23/18  Yes Lavina Hamman, MD  insulin aspart (NOVOLOG) 100 UNIT/ML injection Inject 0-9 Units into the skin 3 (three) times daily with meals. Patient taking differently: Inject 5 Units into the skin 3 (three) times daily with meals.  04/13/18  Yes Nita Sells, MD  insulin glargine (LANTUS) 100 UNIT/ML injection Inject 0.05 mLs (5 Units total) into the skin at bedtime. 04/13/18  Yes Nita Sells, MD  metoprolol tartrate (LOPRESSOR) 25 MG tablet Take 1 tablet (25 mg total) by mouth 2 (two) times daily. 05/02/18  Yes Shelly Coss, MD  multivitamin (RENA-VIT) TABS tablet Take 1 tablet by mouth at bedtime. 04/13/18  Yes Nita Sells, MD  warfarin (COUMADIN)  2.5 MG tablet Take 1 tablet (2.5 mg total) by mouth daily at 6 PM. 05/03/18  Yes Adhikari, Amrit, MD  zolpidem (AMBIEN) 5 MG tablet Take 1 tablet (5 mg total) by mouth at bedtime as needed for sleep. 04/13/18  Yes Nita Sells, MD  acetaminophen (TYLENOL) 325 MG tablet Take 2 tablets (650 mg total) by mouth every 6 (six) hours as needed for mild pain (or Fever >/= 101). Patient not taking: Reported on 07/17/2018 04/24/18   Jonetta Osgood, MD  Blood  Glucose Monitoring Suppl (ACCU-CHEK AVIVA) device Use as instructed 06/05/18 06/05/19  Clent Demark, PA-C  feeding supplement, GLUCERNA SHAKE, (GLUCERNA SHAKE) LIQD Take 237 mLs by mouth 2 (two) times daily between meals. Patient not taking: Reported on 04/26/2018 04/24/18   Jonetta Osgood, MD  glucose blood (ACCU-CHEK AVIVA) test strip Use twice per day. 06/05/18   Clent Demark, PA-C  Insulin Syringe-Needle U-100 (INSULIN SYRINGE .3CC/31GX5/16") 31G X 5/16" 0.3 ML MISC Inject 1 each into the skin 4 (four) times daily. 06/27/18   Clent Demark, PA-C  Lancets (ACCU-CHEK SOFT [facility]) lancets Use twice per day. 06/05/18   Clent Demark, PA-C  ondansetron (ZOFRAN) 4 MG tablet Take 1 tablet (4 mg total) by mouth every 6 (six) hours as needed for nausea. Patient not taking: Reported on 07/17/2018 04/13/18   Nita Sells, MD  sodium chloride (OCEAN) 0.65 % SOLN nasal spray Place 1 spray into both nostrils as needed for congestion. Patient not taking: Reported on 07/17/2018 06/24/18   Lavina Hamman, MD    Social History   Socioeconomic History  . Marital status: Divorced    Spouse name: Not on file  . Number of children: Not on file  . Years of education: Not on file  . Highest education level: Not on file  Occupational History  . Not on file  Social Needs  . Financial resource strain: Not on file  . Food insecurity:    Worry: Not on file    Inability: Not on file  . Transportation needs:    Medical: Not on file    Non-medical: Not on file  Tobacco Use  . Smoking status: Former Smoker    Packs/day: 0.50    Last attempt to quit: 04/08/2018    Years since quitting: 0.2  . Smokeless tobacco: Never Used  . Tobacco comment: trying to quit 04/02/2018  Substance and Sexual Activity  . Alcohol use: Yes  . Drug use: Yes    Types: Cocaine, Heroin    Comment: See notes  . Sexual activity: Not Currently    Birth control/protection: None  Lifestyle  . Physical activity:     Days per week: Not on file    Minutes per session: Not on file  . Stress: Not on file  Relationships  . Social connections:    Talks on phone: Not on file    Gets together: Not on file    Attends religious service: Not on file    Active member of club or organization: Not on file    Attends meetings of clubs or organizations: Not on file    Relationship status: Not on file  . Intimate partner violence:    Fear of current or ex partner: Not on file    Emotionally abused: Not on file    Physically abused: Not on file    Forced sexual activity: Not on file  Other Topics Concern  . Not on file  Social  History Narrative   Per notes, Pt lives with her son.     Family History  Problem Relation Age of Onset  . Autoimmune disease Neg Hx     ROS: [x]  Positive   [ ]  Negative   [ ]  All sytems reviewed and are negative  Cardiovascular: []  chest pain/pressure []  palpitations []  SOB lying flat [x]  DOE []  pain in legs while walking []  pain in legs at rest []  pain in legs at night []  non-healing ulcers []  hx of DVT []  swelling in legs  Pulmonary: []  productive cough []  asthma/wheezing []  home O2  Neurologic: []  weakness in []  arms []  legs []  numbness in []  arms []  legs []  hx of CVA []  mini stroke [] difficulty speaking or slurred speech []  temporary loss of vision in one eye []  dizziness  Hematologic: []  hx of cancer []  bleeding problems []  problems with blood clotting easily  Endocrine:   []  diabetes []  thyroid disease  GI []  vomiting blood []  blood in stool  GU: []  CKD/renal failure []  HD--[]  M/W/F or []  T/T/S []  burning with urination []  blood in urine  Psychiatric: []  anxiety []  depression  Musculoskeletal: []  arthritis []  joint pain  Integumentary: []  rashes []  ulcers  Constitutional: []  fever []  chills   Physical Examination  Vitals:   07/18/18 1330 07/18/18 1400  BP: 128/81 (!) 154/91  Pulse: 68 74  Resp: 11 18  Temp:    SpO2:  100% 100%   Body mass index is 27.16 kg/m.  General:  NAD, seen in dialysis  Gait: Not observed HENT: WNL, normocephalic Pulmonary: normal non-labored breathing, without Rales, rhonchi,  wheezing Cardiac: RRR Vascular Exam/Pulses:  Right Left  Radial 2+ (normal) 2+ (normal)  Brachial 2+ (normal) 2+ (normal)  Femoral    Popliteal    DP    PT     Extremities: Left upper extremity brachiocephalic AVF with no thrill appreciable, no weakness or tissue loss in either upper extremity Musculoskeletal: no muscle wasting or atrophy  Neurologic: A&O X 3; Appropriate Affect ; SENSATION: normal; MOTOR FUNCTION:  moving all extremities equally. Speech is fluent/normal   CBC    Component Value Date/Time   WBC 4.4 07/17/2018 0935   RBC 6.22 (H) 07/17/2018 0935   HGB 13.0 07/17/2018 0935   HGB 8.0 (L) 03/27/2018 1107   HCT 45.9 07/17/2018 0935   HCT 25.3 (L) 03/27/2018 1107   PLT 124 (L) 07/17/2018 0935   PLT 126 (L) 03/27/2018 1107   MCV 73.8 (L) 07/17/2018 0935   MCV 83 03/27/2018 1107   MCH 20.9 (L) 07/17/2018 0935   MCHC 28.3 (L) 07/17/2018 0935   RDW 21.5 (H) 07/17/2018 0935   RDW 19.9 (H) 03/27/2018 1107   LYMPHSABS 1.2 07/17/2018 0935   LYMPHSABS 0.5 (L) 03/27/2018 1107   MONOABS 0.5 07/17/2018 0935   EOSABS 0.2 07/17/2018 0935   EOSABS 0.0 03/27/2018 1107   BASOSABS 0.2 (H) 07/17/2018 0935   BASOSABS 0.0 03/27/2018 1107    BMET    Component Value Date/Time   NA 141 07/17/2018 0935   NA 135 03/25/2018 1014   K 4.3 07/17/2018 0935   CL 110 07/17/2018 0935   CO2 23 07/17/2018 0935   GLUCOSE 103 (H) 07/17/2018 0935   BUN 32 (H) 07/17/2018 0935   BUN 96 (HH) 03/25/2018 1014   CREATININE 3.19 (H) 07/17/2018 0935   CALCIUM 9.1 07/17/2018 0935   GFRNONAA 14 (L) 07/17/2018 0935   GFRAA 17 (L) 07/17/2018 0935  COAGS: Lab Results  Component Value Date   INR 0.99 07/18/2018   INR 1.19 07/17/2018   INR 3.40 07/17/2018     Non-Invasive Vascular Imaging:     Pending   ASSESSMENT/PLAN: This is a 62 y.o. female with multiple medical problems currently admitted to the neuro ICU with a intracerebral hemorrhage in the setting of anticoagulation for PE.  Vascular surgery was called to evaluate her left upper extremity AV fistula which is a brachiocephalic placed in June by Dr. Donnetta Hutching.  I suspect her fistula is thrombosed given no appreciable thrill on exam.  Patient has been noncompliant and it is unclear exactly when this fistula potentially thrombosed and appears it was having issues back in the summertime when it was evaluated with a fistula duplex (low flow volume and stenosis at anastomosis).  We will get a fistula duplex to confirm that it is indeed thrombosed.  Suspect she will need new vein mapping to evaluate for new access.  Discussed with patient that if she is going to be discharged this weekend we could arrange this on an outpatient basis.  She has a functioning TDC now.  Marty Heck, MD [facility] Office: [phone] Pager: [place]

## 2018-07-18 NOTE — Progress Notes (Signed)
Occupational Therapy Evaluation Patient Details Name: Rhonda Lynch MRN: [id number] DOB: [birth year] Today's Date: 07/18/2018    History of Present Illness 62 y.o. female past medical history of diabetes, ESRD on HD, depression and suicidality,, hypertension noncompliant to medications, substance abuse with last cocaine use according to her 1 month ago, PE on Coumadin, presenting for evaluation of unremitting headaches for 3 to 4 weeks.  CT of the head was performed that showed a small bleed in the inferior horn of the left lateral ventricle   Clinical Impression   PTA, pt independent with ADL, IADL and mobility. Pt lives with friend who assists with driving. Pt appears close to baseline and will have 24/7 S after DC. Educated pt on signs/symptoms of CVA using [employer]. OT signing off.     Follow Up Recommendations  No OT follow up;Supervision - Intermittent(S with medication and finance managemetn)    Equipment Recommendations  None recommended by OT    Recommendations for Other Services       Precautions / Restrictions Precautions Precautions: None      Mobility Bed Mobility Overal bed mobility: Independent                Transfers Overall transfer level: Modified independent                    Balance Overall balance assessment: Mild deficits observed, not formally tested                                         ADL either performed or assessed with clinical judgement   ADL Overall ADL's : At baseline                                             Vision Baseline Vision/History: Wears glasses Wears Glasses: Reading only Patient Visual Report: Blurring of vision(from busted vessel in L eye) Vision Assessment?: No apparent visual deficits Additional Comments: appears at baseline     Perception Perception Comments: [place] tested?: Within functional limits    Pertinent Vitals/Pain Pain  Assessment: 0-10 Pain Score: 6  Pain Location: head Pain Descriptors / Indicators: Aching;Headache Pain Intervention(s): Limited activity within patient's tolerance     Hand Dominance Right   Extremity/Trunk Assessment Upper Extremity Assessment Upper Extremity Assessment: Overall WFL for tasks assessed   Lower Extremity Assessment Lower Extremity Assessment: Overall WFL for tasks assessed   Cervical / Trunk Assessment Cervical / Trunk Assessment: Normal   Communication Communication Communication: No difficulties   Cognition Arousal/Alertness: Awake/alert Behavior During Therapy: WFL for tasks assessed/performed Overall Cognitive Status: Within Functional Limits for tasks assessed                                     General Comments  Able to ambulate and dual sequence while turning head without LOB    Exercises     Shoulder Instructions      Home Living Family/patient expects to be discharged to:: Private residence Living Arrangements: Non-relatives/Friends Available Help at Discharge: Available 24 hours/day Type of Home: House Home Access: Stairs to enter [employer] of Steps: 2 Entrance Stairs-Rails: Right;Left;Can reach both Home Layout: One  level     Bathroom Shower/Tub: Tub/shower unit;Door   [employer] Toilet: Standard Bathroom Accessibility: Yes How Accessible: Accessible via walker Home Equipment: Walker - 4 wheels          Prior Functioning/Environment Level of Independence: Independent        Comments: does not drive; does her finances and medication management        OT Problem List: Pain      OT Treatment/Interventions:      OT Goals(Current goals can be found in the care plan section) Acute Rehab OT Goals Patient Stated Goal: [place] home OT Goal Formulation: All assessment and education complete, DC therapy  OT Frequency:     Barriers to D/C:            Co-evaluation PT/OT/SLP  Co-Evaluation/Treatment: Yes Reason for Co-Treatment: Complexity of the patient's impairments (multi-system involvement);To address functional/ADL transfers   OT goals addressed during session: ADL's and self-care      AM-PAC PT "6 Clicks" Daily Activity     Outcome Measure Help from another person eating meals?: None Help from another person taking care of personal grooming?: None Help from another person toileting, which includes using toliet, bedpan, or urinal?: None Help from another person bathing (including washing, rinsing, drying)?: None Help from another person to put on and taking off regular upper body clothing?: None Help from another person to put on and taking off regular lower body clothing?: None 6 Click Score: 24   End of Session Equipment Utilized During Treatment: Gait belt Nurse Communication: Mobility status  Activity Tolerance: Patient tolerated treatment well Patient left: in chair;with call bell/phone within reach  OT Visit Diagnosis: Pain;Unsteadiness on feet (R26.81) Pain - part of body: (head)                Time: 3300-7622 OT Time Calculation (min): 24 min Charges:  OT General Charges $OT Visit: 1 Visit OT Evaluation $OT Eval Moderate Complexity: [place], OT/L   Acute OT Clinical Specialist [place] Pager [phone] Office [phone]   [facility] 07/18/2018, 10:04 AM

## 2018-07-18 NOTE — Progress Notes (Signed)
OT Cancellation Note  Patient Details Name: Rhonda Lynch MRN: [id number] DOB: 14-Oct-1955   Cancelled Treatment:    Reason Eval/Treat Not Completed: Active bedrest order  [place], OT/L   Acute OT Clinical Specialist Acute Rehabilitation Services Pager [phone] Office [phone]  07/18/2018, 8:33 AM

## 2018-07-18 NOTE — Progress Notes (Signed)
Pt ordered to have HD today. BP more stable. Verbal order obtained from Dr. Erlinda Hong for patient to transport without RN. If BP still stable once she returns to Neuro ICU from HD, RN can call for med surge transfer order.

## 2018-07-18 NOTE — Progress Notes (Signed)
STROKE TEAM PROGRESS NOTE   SUBJECTIVE (INTERVAL HISTORY) Her RN is at the bedside.  Overall her condition is gradually improving. Her HA is better although she still needs tramadol PRN for HA. She is sitting in bed playing word puzzles. Pending HD today and she has missed HD for about a week. Passed swallow, on diet, will put on PO meds.    OBJECTIVE Temp:  [97.5 F (36.4 C)-98.3 F (36.8 C)] 98.3 F (36.8 C) (11/08 0832) Pulse Rate:  [65-92] 81 (11/08 0930) Cardiac Rhythm: Normal sinus rhythm (11/08 0800) Resp:  [10-28] 20 (11/08 0930) BP: (99-166)/(47-131) 136/102 (11/08 0930) SpO2:  [91 %-100 %] 100 % (11/08 0930) Weight:  [65.2 kg] 65.2 kg (11/07 1500)  Recent Labs  Lab 07/17/18 1652  GLUCAP 157*   Recent Labs  Lab 07/17/18 0935  NA 141  K 4.3  CL 110  CO2 23  GLUCOSE 103*  BUN 32*  CREATININE 3.19*  CALCIUM 9.1   No results for input(s): AST, ALT, ALKPHOS, BILITOT, PROT, ALBUMIN in the last 168 hours. Recent Labs  Lab 07/17/18 0935  WBC 4.4  NEUTROABS 2.2  HGB 13.0  HCT 45.9  MCV 73.8*  PLT 124*   No results for input(s): CKTOTAL, CKMB, CKMBINDEX, TROPONINI in the last 168 hours. Recent Labs    07/17/18 0935 07/17/18 1738 07/18/18 0316  LABPROT 33.8* 15.0 13.0  INR 3.40 1.19 0.99   No results for input(s): COLORURINE, LABSPEC, PHURINE, GLUCOSEU, HGBUR, BILIRUBINUR, KETONESUR, PROTEINUR, UROBILINOGEN, NITRITE, LEUKOCYTESUR in the last 72 hours.  Invalid input(s): APPERANCEUR     Component Value Date/Time   CHOL  02/20/2010 0520    129        ATP III CLASSIFICATION:  <200     mg/dL   Desirable  200-239  mg/dL   Borderline High  >=240    mg/dL   High          TRIG 82 02/20/2010 0520   HDL 38 (L) 02/20/2010 0520   CHOLHDL 3.4 02/20/2010 0520   VLDL 16 02/20/2010 0520   LDLCALC  02/20/2010 0520    75        Total Cholesterol/HDL:CHD Risk Coronary Heart Disease Risk Table                     Men   Women  1/2 Average Risk   3.4   3.3   Average Risk       5.0   4.4  2 X Average Risk   9.6   7.1  3 X Average Risk  23.4   11.0        Use the calculated Patient Ratio above and the CHD Risk Table to determine the patient's CHD Risk.        ATP III CLASSIFICATION (LDL):  <100     mg/dL   Optimal  100-129  mg/dL   Near or Above                    Optimal  130-159  mg/dL   Borderline  160-189  mg/dL   High  >190     mg/dL   Very High   Lab Results  Component Value Date   HGBA1C 6.8 (H) 04/02/2018      Component Value Date/Time   LABOPIA POSITIVE (A) 07/17/2018 1511   COCAINSCRNUR POSITIVE (A) 07/17/2018 1511   LABBENZ NONE DETECTED 07/17/2018 1511   AMPHETMU NONE DETECTED 07/17/2018 1511  THCU NONE DETECTED 07/17/2018 1511   LABBARB NONE DETECTED 07/17/2018 1511    No results for input(s): ETH in the last 168 hours.  I have personally reviewed the radiological images below and agree with the radiology interpretations.  Ct Abdomen Pelvis Wo Contrast  Result Date: 06/21/2018 CLINICAL DATA:  62 year old female with history of severe headache. Sepsis. EXAM: CT ABDOMEN AND PELVIS WITHOUT CONTRAST TECHNIQUE: Multidetector CT imaging of the abdomen and pelvis was performed following the standard protocol without IV contrast. COMPARISON:  CT the abdomen and pelvis 04/19/2018. FINDINGS: Lower chest: Cardiomegaly. Small bilateral pleural effusions lying dependently. Patchy multifocal ground-glass attenuation and septal thickening throughout the lung bases bilaterally. Hepatobiliary: No definite cystic or solid hepatic lesions are confidently identified on today's noncontrast CT examination. Status post cholecystectomy. Pancreas: No definite pancreatic mass or peripancreatic fluid or inflammatory changes are noted on today's noncontrast CT examination. Spleen: Unremarkable. Adrenals/Urinary Tract: Unenhanced appearance of the kidneys and bilateral adrenal glands is normal. No hydroureteronephrosis. Urinary bladder is normal in  appearance. Stomach/Bowel: Unenhanced appearance of the stomach is normal. No pathologic dilatation of small bowel or colon. Normal appendix. Vascular/Lymphatic: Aortic atherosclerosis. No lymphadenopathy noted in the abdomen or pelvis. Reproductive: Status post hysterectomy. Right ovary is not confidently identified may be surgically absent or atrophic. 1.8 cm fatty attenuation lesion in the left adnexa, stable compared to numerous prior examinations, most compatible with a small dermoid. Other: No significant volume of ascites.  No pneumoperitoneum. Musculoskeletal: There are no aggressive appearing lytic or blastic lesions noted in the visualized portions of the skeleton. IMPRESSION: 1. No acute findings are noted in the abdomen or pelvis to account for the patient's symptoms. 2. Findings in the lung bases concerning for congestive heart failure, as above. 3. Aortic atherosclerosis. 4. Additional incidental findings, as above. Electronically Signed   By: Vinnie Langton M.D.   On: 06/21/2018 02:58   Ct Angio Head W Or Wo Contrast  Result Date: 07/17/2018 CLINICAL DATA:  Followup intracranial hemorrhage EXAM: CT ANGIOGRAPHY HEAD AND NECK TECHNIQUE: Multidetector CT imaging of the head and neck was performed using the standard protocol during bolus administration of intravenous contrast. Multiplanar CT image reconstructions and MIPs were obtained to evaluate the vascular anatomy. Carotid stenosis measurements (when applicable) are obtained utilizing NASCET criteria, using the distal internal carotid diameter as the denominator. CONTRAST:  132mL ISOVUE-370 IOPAMIDOL (ISOVUE-370) INJECTION 76% COMPARISON:  CT earlier same day FINDINGS: CTA NECK FINDINGS Aortic arch: Aortic atherosclerosis. No aneurysm or dissection. Brachiocephalic branching pattern is normal without origin stenosis. Right carotid system: Common carotid artery is tortuous but widely patent to the bifurcation. The carotid bifurcation is normal  without atherosclerotic plaque, narrowing or irregularity. Cervical ICA is tortuous but widely patent. Left carotid system: Common carotid artery is widely patent to the bifurcation. The carotid bifurcation is normal without soft or calcified plaque. Cervical ICA is tortuous but widely patent. Vertebral arteries: Both subclavian arteries are widely patent. Both vertebral artery origins are widely patent. Both vertebral arteries are widely patent through the cervical region to the foramen magnum. Skeleton: Cervical spondylosis.  No acute bone finding. Other neck: No mass or lymphadenopathy. Upper chest: Mild scarring.  No active process. Review of the MIP images confirms the above findings CTA HEAD FINDINGS Anterior circulation: Both internal carotid arteries are patent through the skull base and siphon regions. There is atherosclerotic calcification in the carotid siphon regions but no stenosis more than about 20%. The anterior and middle cerebral vessels are patent  without proximal stenosis, aneurysm or vascular malformation. There are large posterior communicating arteries on each side. Posterior circulation: Both vertebral arteries are patent through the foramen magnum to the basilar. Posterior inferior cerebellar arteries appear normal. There is no basilar stenosis. Superior cerebellar and posterior cerebral arteries are patent and normal. PCAs receive most of there supply from the anterior circulation. No evidence of abnormal arterial structure in the left hippocampal region. Venous sinuses: Patent and normal. Anatomic variants: None significant. Delayed phase: No abnormal enhancement in the region of the 8-9 mm hemorrhage in the left hippocampal region. No abnormal brain enhancement elsewhere. Review of the MIP images confirms the above findings IMPRESSION: No significant large or medium vessel pathology. No evidence of atherosclerotic narrowing. The vessels are tortuous as might be seen with hypertension.  There is nonstenotic atherosclerotic calcification in the carotid siphon regions. No evidence of abnormal arterial or venous structure in the region of the left hippocampus where there is an intraparenchymal hemorrhage measuring 8-9 mm. Electronically Signed   By: Nelson Chimes M.D.   On: 07/17/2018 14:19   Ct Head Wo Contrast  Result Date: 07/17/2018 CLINICAL DATA:  Headache and left eye pain for 3 weeks. EXAM: CT HEAD WITHOUT CONTRAST TECHNIQUE: Contiguous axial images were obtained from the base of the skull through the vertex without intravenous contrast. COMPARISON:  Head CT scan 06/21/2018. FINDINGS: Brain: The patient has a new small hemorrhage which appears to be deep in the inferior horn of the left lateral ventricle. No mass, hydrocephalus, mass effect or midline shift is identified. No evidence of acute infarct is seen. Vascular: Atherosclerosis is noted. Skull: Intact.  No focal lesion. Sinuses/Orbits: Negative. Other: None. IMPRESSION: Small hemorrhage deep in the inferior horn of the left lateral ventricle. Cause for this abnormality is not identified but note is made that the patient is anticoagulated. Brain MRI and MRI for further evaluation recommended. Critical Value/emergent results were called by telephone at the time of interpretation on 07/17/2018 at 11:14 am to [place], P. A., who verbally acknowledged these results. Electronically Signed   By: Inge Rise M.D.   On: 07/17/2018 11:17   Ct Head Wo Contrast  Result Date: 06/23/2018 CLINICAL DATA:  Initial evaluation for chronic headache. EXAM: CT HEAD WITHOUT CONTRAST TECHNIQUE: Contiguous axial images were obtained from the base of the skull through the vertex without intravenous contrast. COMPARISON:  Prior CT from 06/21/2018. FINDINGS: Brain: Cerebral volume within normal limits for patient age. No evidence for acute intracranial hemorrhage. No findings to suggest acute large vessel territory infarct. No mass lesion, midline  shift, or mass effect. Ventricles are normal in size without evidence for hydrocephalus. No extra-axial fluid collection identified. Vascular: No hyperdense vessel identified. Skull: Scalp soft tissues demonstrate no acute abnormality. Calvarium intact. Sinuses/Orbits: Globes and orbital soft tissues within normal limits. Chronic left maxillary sinusitis. Small retention cyst noted within the left sphenoid sinus. Mastoid air cells are clear. IMPRESSION: 1. No acute intracranial abnormality. 2. Chronic left maxillary sinusitis. Electronically Signed   By: Jeannine Boga M.D.   On: 06/23/2018 22:54   Ct Head Wo Contrast  Result Date: 06/21/2018 CLINICAL DATA:  [place] headache. EXAM: CT HEAD WITHOUT CONTRAST TECHNIQUE: Contiguous axial images were obtained from the base of the skull through the vertex without intravenous contrast. COMPARISON:  None. FINDINGS: Brain: There is no mass, hemorrhage or extra-axial collection. The size and configuration of the ventricles and extra-axial CSF spaces are normal. There is no acute or chronic infarction.  The brain parenchyma is normal. Vascular: No abnormal hyperdensity of the major intracranial arteries or dural venous sinuses. No intracranial atherosclerosis. Skull: The visualized skull base, calvarium and extracranial soft tissues are normal. Sinuses/Orbits: Mild left maxillary sinus mucosal thickening. No mastoid or middle ear effusion. The orbits are normal. IMPRESSION: Normal brain. Electronically Signed   By: Ulyses Jarred M.D.   On: 06/21/2018 02:32   Ct Angio Neck W Or Wo Contrast  Result Date: 07/17/2018 CLINICAL DATA:  Followup intracranial hemorrhage EXAM: CT ANGIOGRAPHY HEAD AND NECK TECHNIQUE: Multidetector CT imaging of the head and neck was performed using the standard protocol during bolus administration of intravenous contrast. Multiplanar CT image reconstructions and MIPs were obtained to evaluate the vascular anatomy. Carotid stenosis  measurements (when applicable) are obtained utilizing NASCET criteria, using the distal internal carotid diameter as the denominator. CONTRAST:  125mL ISOVUE-370 IOPAMIDOL (ISOVUE-370) INJECTION 76% COMPARISON:  CT earlier same day FINDINGS: CTA NECK FINDINGS Aortic arch: Aortic atherosclerosis. No aneurysm or dissection. Brachiocephalic branching pattern is normal without origin stenosis. Right carotid system: Common carotid artery is tortuous but widely patent to the bifurcation. The carotid bifurcation is normal without atherosclerotic plaque, narrowing or irregularity. Cervical ICA is tortuous but widely patent. Left carotid system: Common carotid artery is widely patent to the bifurcation. The carotid bifurcation is normal without soft or calcified plaque. Cervical ICA is tortuous but widely patent. Vertebral arteries: Both subclavian arteries are widely patent. Both vertebral artery origins are widely patent. Both vertebral arteries are widely patent through the cervical region to the foramen magnum. Skeleton: Cervical spondylosis.  No acute bone finding. Other neck: No mass or lymphadenopathy. Upper chest: Mild scarring.  No active process. Review of the MIP images confirms the above findings CTA HEAD FINDINGS Anterior circulation: Both internal carotid arteries are patent through the skull base and siphon regions. There is atherosclerotic calcification in the carotid siphon regions but no stenosis more than about 20%. The anterior and middle cerebral vessels are patent without proximal stenosis, aneurysm or vascular malformation. There are large posterior communicating arteries on each side. Posterior circulation: Both vertebral arteries are patent through the foramen magnum to the basilar. Posterior inferior cerebellar arteries appear normal. There is no basilar stenosis. Superior cerebellar and posterior cerebral arteries are patent and normal. PCAs receive most of there supply from the anterior  circulation. No evidence of abnormal arterial structure in the left hippocampal region. Venous sinuses: Patent and normal. Anatomic variants: None significant. Delayed phase: No abnormal enhancement in the region of the 8-9 mm hemorrhage in the left hippocampal region. No abnormal brain enhancement elsewhere. Review of the MIP images confirms the above findings IMPRESSION: No significant large or medium vessel pathology. No evidence of atherosclerotic narrowing. The vessels are tortuous as might be seen with hypertension. There is nonstenotic atherosclerotic calcification in the carotid siphon regions. No evidence of abnormal arterial or venous structure in the region of the left hippocampus where there is an intraparenchymal hemorrhage measuring 8-9 mm. Electronically Signed   By: Nelson Chimes M.D.   On: 07/17/2018 14:19   [name] PF Contrast  Result Date: 07/18/2018 CLINICAL DATA:  Headaches. On anti coagulation. Left hippocampal hemorrhage. EXAM: MRI HEAD WITHOUT AND WITH CONTRAST TECHNIQUE: Multiplanar, multiecho pulse sequences of the brain and surrounding structures were obtained without and with intravenous contrast. CONTRAST:  6 mL Gadavist COMPARISON:  CTA head neck 07/17/2018 FINDINGS: BRAIN: There is a small focus of magnetic susceptibility effect in the area of  the left optic tract and tail of the caudate nucleus. This shows very low T2-weighted signal and intermediate T1-weighted signal. The focus measures 6-8 mm. No acute ischemia. There is no associated contrast enhancement. Minimal surrounding edema. The midline structures are normal. No midline shift or other mass effect. There are no old infarcts. Multifocal white matter hyperintensity, most commonly due to chronic ischemic microangiopathy. The cerebral and cerebellar volume are age-appropriate. Susceptibility-sensitive sequences show no chronic microhemorrhage or superficial siderosis. No abnormal contrast enhancement. VASCULAR: Major  intracranial arterial and venous sinus flow voids are normal. SKULL AND UPPER CERVICAL SPINE: Calvarial bone marrow signal is normal. There is no skull base mass. Visualized upper cervical spine and soft tissues are normal. SINUSES/ORBITS: No fluid levels or advanced mucosal thickening. No mastoid or middle ear effusion. The orbits are normal. IMPRESSION: 1. Subcentimeter focus of hemorrhage, likely early subacute, within the tail of the left caudate nucleus, in close proximity to the left optic tract. No underlying lesion is identified. This may be a hypertensive hemorrhage in the setting of anticoagulation. 2. Mild findings of chronic small vessel ischemia. Electronically Signed   By: Ulyses Jarred M.D.   On: 07/18/2018 03:20   Dg Chest Port 1 View  Result Date: 06/22/2018 CLINICAL DATA:  Shortness of breath EXAM: PORTABLE CHEST 1 VIEW COMPARISON:  06/20/2018 FINDINGS: Cardiac shadow is enlarged. Dialysis catheter is again seen. Persistent right basilar infiltrate is noted although significantly improved when compared with the prior exam. No significant interstitial edema is noted. No bony abnormality is seen. IMPRESSION: Significant improved aeration in the right base. Electronically Signed   By: Inez Catalina M.D.   On: 06/22/2018 12:38   Dg Chest Port 1 View  Result Date: 06/20/2018 CLINICAL DATA:  Initial evaluation for acute shortness of breath, cough. EXAM: PORTABLE CHEST 1 VIEW COMPARISON:  Prior radiograph from 06/07/2018. FINDINGS: Right-sided dual lumen hemodialysis catheter in place with tips overlying the cavoatrial junction. Moderate cardiomegaly, stable. Mediastinal silhouette within normal limits. Aortic atherosclerosis. Lungs mildly hypoinflated. Mild diffuse pulmonary vascular and interstitial congestion. Pat superimposed patchy right lower lobe opacity suspicious for possible infiltrate. Associated small moderate right pleural effusion with subpulmonic component. No pneumothorax. No  acute osseous abnormality. IMPRESSION: 1. Patchy right lower lobe opacity, concerning for pneumonia given provided history of cough. This has worsened from 06/07/2018. 2. Associated small to moderate right pleural effusion. 3. Cardiomegaly with underlying mild diffuse pulmonary interstitial congestion/edema. 4. Aortic atherosclerosis. Electronically Signed   By: Jeannine Boga M.D.   On: 06/20/2018 22:00    TEE 04/29/18 - Left ventricle: Systolic function was mildly to moderately   reduced. The estimated ejection fraction was in the range of 40%   to 45%. Mild diffuse hypokinesis. - Aortic valve: No evidence of vegetation. - Mitral valve: No evidence of vegetation. There was mild   regurgitation. - Left atrium: The atrium was mildly to moderately dilated. No   evidence of thrombus in the atrial cavity or appendage. - Right atrium: The atrium was mildly to moderately dilated. No   evidence of thrombus in the atrial cavity or appendage. - Atrial septum: No defect or patent foramen ovale was identified. - Tricuspid valve: The posterior leaflet of the TV appears   thickened but no obvious mobile density. There is moderate   eccentric TR directed towards the interatrial septum. Suspicious   for possible TV vegetation. - Pulmonic valve: No evidence of vegetation.    PHYSICAL EXAM  Temp:  [97.5 F (36.4 C)-98.3  F (36.8 C)] 98.3 F (36.8 C) (11/08 0832) Pulse Rate:  [65-92] 81 (11/08 0930) Resp:  [10-28] 20 (11/08 0930) BP: (99-166)/(47-131) 136/102 (11/08 0930) SpO2:  [91 %-100 %] 100 % (11/08 0930) Weight:  [65.2 kg] 65.2 kg (11/07 1500)  General - Well nourished, well developed, in no apparent distress.  Ophthalmologic - fundi not visualized due to noncooperation.  Cardiovascular - Regular rate and rhythm.  Mental Status -  Level of arousal and orientation to time, place, and person were intact. Language including expression, naming, repetition, comprehension was assessed  and found intact. Fund of Knowledge was assessed and was impaired, can tell presidents 2/5.   Cranial Nerves II - XII - II - Visual field intact OU. III, IV, VI - Extraocular movements intact. V - Facial sensation intact bilaterally. VII - Facial movement intact bilaterally. VIII - Hearing & vestibular intact bilaterally. X - Palate elevates symmetrically. XI - Chin turning & shoulder shrug intact bilaterally. XII - Tongue protrusion intact.  Motor Strength - [name] strength was normal in all extremities and pronator drift was absent.  Bulk was normal and fasciculations were absent.   Motor Tone - Muscle tone was assessed at the neck and appendages and was normal.  Reflexes - [name] reflexes were symmetrical in all extremities and she had no pathological reflexes.  Sensory - Light touch, temperature/pinprick were assessed and were symmetrical.    Coordination - The patient had normal movements in the hands with no ataxia or dysmetria.  Tremor was absent.  Gait and Station - deferred.   ASSESSMENT/PLAN Rhonda Lynch is a 62 y.o. 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 No tPA given due to [place].    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  CTA head and neck - unremarkable  TEE 04/2018 - EF 40-45%, no vegetation  LDL pending  HgbA1c pending  INR 3.4->0.99  SCDs for VTE prophylaxis  warfarin daily prior to admission, now on No antithrombotic. Coumadin was reversed with Kcentra  Ongoing aggressive stroke risk factor management  Therapy recommendations:  Pending   Disposition:  Pending   Hx of PE on coumadin  Diagnosed in 8//18/2019 with VQ scan in the setting of fungal  infection  Put on coumadin since  INR has been stable but found to have INR 3.40 on admission  There is no mentioning in previous charts regarding how long the coumadin will need  It is almost 3 months now for treatment, given the ICH, I would advocate to discontinue coumadin at this time  Diabetes  HgbA1c pending goal < 7.0  Controlled  Glucose within range   CBG monitoring  SSI  Hypertension . Stable on cleviprex . BP goal < 160 . Will avoid home metoprolol for now given UDS positive on cocaine . Add amlodipine, and hydralazine . Taper off cleviprex as able  Long term BP goal normotensive  ESRD on HD  Noncompliance  Missed HD for a week  Nephrology on board  Will do HD today which will also help for HTN  BMP monitoring  Cocaine abuse   last cocaine use according to pt was 1 month ago  Cessation education given  UDS positive for cocaine  Will avoid metoprolol for now  Pt is willing to quit  Other Stroke Risk Factors  Advanced age  Cigarette smoker, quit 3 months ago  Migraines  Other Active Problems  Noncompliance with meds and HD  Hospital day # 1  This patient is critically ill due to [place] on coumadin, hypertensive emergency, cocaine abuse and at significant risk of neurological worsening, death form recurrent bleeding, cerebral edema, seizure, hypertensive encephalopathy, cocaine withdraw. This patient's care requires constant monitoring of vital signs, hemodynamics, respiratory and cardiac monitoring, review of multiple databases, neurological assessment, discussion with family, other specialists and medical decision making of high complexity. I spent 40 minutes of neurocritical care time in the care of this patient.   Rosalin Hawking, MD PhD Stroke Neurology 07/18/2018 10:56 AM    To contact Stroke Continuity provider, please refer to [email or web address] After hours, contact General Neurology

## 2018-07-18 NOTE — Evaluation (Signed)
Physical Therapy Evaluation Patient Details Name: Rhonda Lynch MRN: [id number] DOB: [birth year] Today's Date: 07/18/2018   History of Present Illness  62 y.o. female past medical history of diabetes, ESRD on HD, depression and suicidality,, hypertension noncompliant to medications, substance abuse with last cocaine use according to her 1 month ago, PE on Coumadin, presenting for evaluation of unremitting headaches for 3 to 4 weeks.  CT of the head was performed that showed a small bleed in the inferior horn of the left lateral ventricle  Clinical Impression  Patient presents close to functional baseline and able to walk without device and turn head in hallway without LOB perform wayfinding activities as well.  No further skilled PT needs at this time.  Will sign off, but recommend nursing staff to continue twice daily walks since pt has monitoring.     Follow Up Recommendations No PT follow up    Equipment Recommendations  None recommended by PT    Recommendations for Other Services       Precautions / Restrictions Precautions Precautions: None      Mobility  Bed Mobility Overal bed mobility: Independent                Transfers Overall transfer level: Modified independent               General transfer comment: up from EOB   Ambulation/Gait Ambulation/Gait assistance: Independent Gait Distance (Feet): 300 Feet Assistive device: None Gait Pattern/deviations: Step-through pattern     General Gait Details: slower pace due to lines, able to turn head to look and read signs in hallway with out LOB, stopped and looked back quicly with cues to identify object in hallway  Stairs            Wheelchair Mobility    Modified Rankin (Stroke Patients Only) Modified Rankin (Stroke Patients Only) Pre-Morbid Rankin Score: No significant disability Modified Rankin: No significant disability     Balance Overall balance assessment: No apparent balance deficits  (not formally assessed)                                           Pertinent Vitals/Pain Pain Assessment: 0-10 Pain Score: 6  Pain Location: head Pain Descriptors / Indicators: Aching;Headache Pain Intervention(s): Repositioned;Monitored during session    Home Living Family/patient expects to be discharged to:: Private residence Living Arrangements: Non-relatives/Friends Available Help at Discharge: Available 24 hours/day Type of Home: House Home Access: Stairs to enter Entrance Stairs-Rails: Right;Left;Can reach both Technical brewer of Steps: 2 Home Layout: One level Home Equipment: Walker - 4 wheels      Prior Function Level of Independence: Independent         Comments: does not drive; does her finances and medication management; SCAT to dialysis or friends take her     Hand Dominance   Dominant Hand: Right    Extremity/Trunk Assessment   Upper Extremity Assessment Upper Extremity Assessment: Overall WFL for tasks assessed    Lower Extremity Assessment Lower Extremity Assessment: Overall WFL for tasks assessed    Cervical / Trunk Assessment Cervical / Trunk Assessment: Normal  Communication   Communication: No difficulties  Cognition Arousal/Alertness: Awake/alert Behavior During Therapy: WFL for tasks assessed/performed Overall Cognitive Status: Within Functional Limits for tasks assessed  General Comments General comments (skin integrity, edema, etc.): Able to ambulate and dual sequence while turning head without LOB    Exercises     Assessment/Plan    PT Assessment Patent does not need any further PT services  PT Problem List         PT Treatment Interventions      PT Goals (Current goals can be found in the Care Plan section)  Acute Rehab PT Goals Patient Stated Goal: to go home PT Goal Formulation: All assessment and education complete, DC therapy     Frequency     Barriers to discharge        Co-evaluation PT/OT/SLP Co-Evaluation/Treatment: Yes Reason for Co-Treatment: To address functional/ADL transfers PT goals addressed during session: Mobility/safety with mobility;Balance OT goals addressed during session: ADL's and self-care       AM-PAC PT "6 Clicks" Daily Activity  Outcome Measure Difficulty turning over in bed (including adjusting bedclothes, sheets and blankets)?: None Difficulty moving from lying on back to sitting on the side of the bed? : A Little Difficulty sitting down on and standing up from a chair with arms (e.g., wheelchair, bedside commode, etc,.)?: A Little Help needed moving to and from a bed to chair (including a wheelchair)?: None Help needed walking in hospital room?: None Help needed climbing 3-5 steps with a railing? : A Little 6 Click Score: 21    End of Session Equipment Utilized During Treatment: Gait belt Activity Tolerance: Patient tolerated treatment well Patient left: in chair;with call bell/phone within reach   PT Visit Diagnosis: Other abnormalities of gait and mobility (R26.89)    Time: 0233-4356 PT Time Calculation (min) (ACUTE ONLY): 24 min   Charges:   PT Evaluation $PT Eval Low Complexity: [place], [place] [facility] [phone] 07/18/2018   Reginia Naas 07/18/2018, 10:46 AM

## 2018-07-18 NOTE — Progress Notes (Signed)
Patient arrived from HD in NAD. Patient complaining of headache, Tramadol given.

## 2018-07-19 ENCOUNTER — Inpatient Hospital Stay (HOSPITAL_COMMUNITY): Payer: Medicare Other

## 2018-07-19 DIAGNOSIS — Z01810 Encounter for preprocedural cardiovascular examination: Secondary | ICD-10-CM

## 2018-07-19 LAB — HEMOGLOBIN A1C
HEMOGLOBIN A1C: 6.1 % — AB (ref 4.8–5.6)
MEAN PLASMA GLUCOSE: 128.37 mg/dL

## 2018-07-19 LAB — CBC
HEMATOCRIT: 39.7 % (ref 36.0–46.0)
Hemoglobin: 11.8 g/dL — ABNORMAL LOW (ref 12.0–15.0)
MCH: 21.2 pg — AB (ref 26.0–34.0)
MCHC: 29.7 g/dL — AB (ref 30.0–36.0)
MCV: 71.4 fL — AB (ref 80.0–100.0)
Platelets: 113 10*3/uL — ABNORMAL LOW (ref 150–400)
RBC: 5.56 MIL/uL — ABNORMAL HIGH (ref 3.87–5.11)
RDW: 20.7 % — AB (ref 11.5–15.5)
WBC: 5.8 10*3/uL (ref 4.0–10.5)
nRBC: 0 % (ref 0.0–0.2)

## 2018-07-19 LAB — GLUCOSE, CAPILLARY
GLUCOSE-CAPILLARY: 142 mg/dL — AB (ref 70–99)
Glucose-Capillary: 95 mg/dL (ref 70–99)
Glucose-Capillary: 96 mg/dL (ref 70–99)

## 2018-07-19 LAB — BASIC METABOLIC PANEL
Anion gap: 8 (ref 5–15)
BUN: 18 mg/dL (ref 8–23)
CALCIUM: 8.6 mg/dL — AB (ref 8.9–10.3)
CO2: 24 mmol/L (ref 22–32)
CREATININE: 4.3 mg/dL — AB (ref 0.44–1.00)
Chloride: 102 mmol/L (ref 98–111)
GFR calc Af Amer: 12 mL/min — ABNORMAL LOW (ref >60)
GFR calc non Af Amer: 10 mL/min — ABNORMAL LOW (ref >60)
GLUCOSE: 75 mg/dL (ref 70–99)
Potassium: 4.3 mmol/L (ref 3.5–5.1)
Sodium: 134 mmol/L — ABNORMAL LOW (ref 135–145)

## 2018-07-19 LAB — LIPID PANEL
CHOL/HDL RATIO: 3.8 ratio
Cholesterol: 226 mg/dL — ABNORMAL HIGH (ref 0–200)
HDL: 59 mg/dL (ref >40)
LDL CALC: 142 mg/dL — AB (ref 0–99)
Triglycerides: 127 mg/dL (ref <150)
VLDL: 25 mg/dL (ref 0–40)

## 2018-07-19 LAB — PROTIME-INR
INR: 0.96
Prothrombin Time: 12.7 seconds (ref 11.4–15.2)

## 2018-07-19 LAB — HEPATITIS B SURFACE ANTIGEN: Hepatitis B Surface Ag: NEGATIVE

## 2018-07-19 MED ORDER — HEPARIN SODIUM (PORCINE) 1000 UNIT/ML IJ SOLN
INTRAMUSCULAR | Status: AC
  Filled 2018-07-19: qty 3

## 2018-07-19 MED ORDER — DIPHENHYDRAMINE-ZINC ACETATE 2-0.1 % EX CREA
TOPICAL_CREAM | Freq: Every day | CUTANEOUS | Status: DC | PRN
  Administered 2018-07-22: 05:00:00 via TOPICAL
  Filled 2018-07-19: qty 28

## 2018-07-19 MED ORDER — CHLORHEXIDINE GLUCONATE CLOTH 2 % EX PADS: 6.0000 | MEDICATED_PAD | Freq: Every day | CUTANEOUS | Status: DC

## 2018-07-19 MED ORDER — CALCITRIOL 0.25 MCG PO CAPS
ORAL_CAPSULE | ORAL | Status: AC
  Administered 2018-07-19: 0.25 ug via ORAL
  Filled 2018-07-19: qty 1

## 2018-07-19 NOTE — Progress Notes (Signed)
[facility] Progress Note   Subjective:   HD yesterday with UF 1.8L. Transferred to floor status. Feels well. No complaints.   Objective Vitals:   07/18/18 1823 07/18/18 2349 07/19/18 0329 07/19/18 0744  BP: (!) 155/97 (!) 147/85 132/73 (!) 148/91  Pulse: 99 70 74 73  Resp: '15 18 18 16  ' Temp: 97.8 F (36.6 C) 98.8 F (37.1 C) 98.6 F (37 C) 98.2 F (36.8 C)  TempSrc: Oral Oral Oral Oral  SpO2: 100% 97% 97% 94%  Weight: 64.2 kg     Height:       Physical Exam General: Sitting up doing crossword Heart: RRR Lungs: normal WOB Abdomen: soft Extremities: no edema Dialysis Access: [facility] [place] c/d/i  Additional Objective Labs: Basic Metabolic Panel: Recent Labs  Lab 07/17/18 0935 07/18/18 1536 07/19/18 0534  NA 141 136 134*  K 4.3 4.8 4.3  CL 110 107 102  CO2 '23 25 24  ' GLUCOSE 103* 110* 75  BUN 32* 33* 18  CREATININE 3.19* 3.22* 4.30*  CALCIUM 9.1 8.4* 8.6*  PHOS  --  5.2*  --    Liver Function Tests: Recent Labs  Lab 07/18/18 1536  ALBUMIN 2.6*   No results for input(s): LIPASE, AMYLASE in the last 168 hours. CBC: Recent Labs  Lab 07/17/18 0935 07/18/18 1536 07/19/18 0534  WBC 4.4 5.1 5.8  NEUTROABS 2.2  --   --   HGB 13.0 11.4* 11.8*  HCT 45.9 38.6 39.7  MCV 73.8* 73.7* 71.4*  PLT 124* 120* 113*   Blood Culture    Component Value Date/Time   SDES BLOOD RIGHT HAND 06/21/2018 0027   SPECREQUEST  06/21/2018 0027    BOTTLES DRAWN AEROBIC ONLY Blood Culture adequate volume   CULT  06/21/2018 0027    NO GROWTH 5 DAYS Performed at [facility], [place] [address], [place], [place] [address]    REPTSTATUS 06/26/2018 FINAL 06/21/2018 0027    Cardiac Enzymes: No results for input(s): CKTOTAL, CKMB, CKMBINDEX, TROPONINI in the last 168 hours. CBG: Recent Labs  Lab 07/17/18 1652 07/17/18 2123 07/18/18 0817 07/18/18 1136 07/19/18 0833  GLUCAP 157* 151* 101* 128* 95   Iron Studies: No results for input(s): IRON, TIBC, TRANSFERRIN,  FERRITIN in the last 72 hours. '@[email or web address]' @ Studies/Results: Ct Angio Head W Or Wo Contrast  Result Date: 07/17/2018 CLINICAL DATA:  Followup intracranial hemorrhage EXAM: CT ANGIOGRAPHY HEAD AND NECK TECHNIQUE: Multidetector CT imaging of the head and neck was performed using the standard protocol during bolus administration of intravenous contrast. Multiplanar CT image reconstructions and MIPs were obtained to evaluate the vascular anatomy. Carotid stenosis measurements (when applicable) are obtained utilizing NASCET criteria, using the distal internal carotid diameter as the denominator. CONTRAST:  137m ISOVUE-370 IOPAMIDOL (ISOVUE-370) INJECTION 76% COMPARISON:  CT earlier same day FINDINGS: CTA NECK FINDINGS Aortic arch: Aortic atherosclerosis. No aneurysm or dissection. Brachiocephalic branching pattern is normal without origin stenosis. Right carotid system: Common carotid artery is tortuous but widely patent to the bifurcation. The carotid bifurcation is normal without atherosclerotic plaque, narrowing or irregularity. Cervical ICA is tortuous but widely patent. Left carotid system: Common carotid artery is widely patent to the bifurcation. The carotid bifurcation is normal without soft or calcified plaque. Cervical ICA is tortuous but widely patent. Vertebral arteries: Both subclavian arteries are widely patent. Both vertebral artery origins are widely patent. Both vertebral arteries are widely patent through the cervical region to the foramen magnum. Skeleton: Cervical spondylosis.  No acute bone finding.  Other neck: No mass or lymphadenopathy. Upper chest: Mild scarring.  No active process. Review of the MIP images confirms the above findings CTA HEAD FINDINGS Anterior circulation: Both internal carotid arteries are patent through the skull base and siphon regions. There is atherosclerotic calcification in the carotid siphon regions but no stenosis more than about 20%. The anterior and middle  cerebral vessels are patent without proximal stenosis, aneurysm or vascular malformation. There are large posterior communicating arteries on each side. Posterior circulation: Both vertebral arteries are patent through the foramen magnum to the basilar. Posterior inferior cerebellar arteries appear normal. There is no basilar stenosis. Superior cerebellar and posterior cerebral arteries are patent and normal. PCAs receive most of there supply from the anterior circulation. No evidence of abnormal arterial structure in the left hippocampal region. Venous sinuses: Patent and normal. Anatomic variants: None significant. Delayed phase: No abnormal enhancement in the region of the 8-9 mm hemorrhage in the left hippocampal region. No abnormal brain enhancement elsewhere. Review of the MIP images confirms the above findings IMPRESSION: No significant large or medium vessel pathology. No evidence of atherosclerotic narrowing. The vessels are tortuous as might be seen with hypertension. There is nonstenotic atherosclerotic calcification in the carotid siphon regions. No evidence of abnormal arterial or venous structure in the region of the left hippocampus where there is an intraparenchymal hemorrhage measuring 8-9 mm. Electronically Signed   By: Nelson Chimes M.D.   On: 07/17/2018 14:19   Ct Angio Neck W Or Wo Contrast  Result Date: 07/17/2018 CLINICAL DATA:  Followup intracranial hemorrhage EXAM: CT ANGIOGRAPHY HEAD AND NECK TECHNIQUE: Multidetector CT imaging of the head and neck was performed using the standard protocol during bolus administration of intravenous contrast. Multiplanar CT image reconstructions and MIPs were obtained to evaluate the vascular anatomy. Carotid stenosis measurements (when applicable) are obtained utilizing NASCET criteria, using the distal internal carotid diameter as the denominator. CONTRAST:  173m ISOVUE-370 IOPAMIDOL (ISOVUE-370) INJECTION 76% COMPARISON:  CT earlier same day FINDINGS:  CTA NECK FINDINGS Aortic arch: Aortic atherosclerosis. No aneurysm or dissection. Brachiocephalic branching pattern is normal without origin stenosis. Right carotid system: Common carotid artery is tortuous but widely patent to the bifurcation. The carotid bifurcation is normal without atherosclerotic plaque, narrowing or irregularity. Cervical ICA is tortuous but widely patent. Left carotid system: Common carotid artery is widely patent to the bifurcation. The carotid bifurcation is normal without soft or calcified plaque. Cervical ICA is tortuous but widely patent. Vertebral arteries: Both subclavian arteries are widely patent. Both vertebral artery origins are widely patent. Both vertebral arteries are widely patent through the cervical region to the foramen magnum. Skeleton: Cervical spondylosis.  No acute bone finding. Other neck: No mass or lymphadenopathy. Upper chest: Mild scarring.  No active process. Review of the MIP images confirms the above findings CTA HEAD FINDINGS Anterior circulation: Both internal carotid arteries are patent through the skull base and siphon regions. There is atherosclerotic calcification in the carotid siphon regions but no stenosis more than about 20%. The anterior and middle cerebral vessels are patent without proximal stenosis, aneurysm or vascular malformation. There are large posterior communicating arteries on each side. Posterior circulation: Both vertebral arteries are patent through the foramen magnum to the basilar. Posterior inferior cerebellar arteries appear normal. There is no basilar stenosis. Superior cerebellar and posterior cerebral arteries are patent and normal. PCAs receive most of there supply from the anterior circulation. No evidence of abnormal arterial structure in the left hippocampal region. Venous sinuses:  Patent and normal. Anatomic variants: None significant. Delayed phase: No abnormal enhancement in the region of the 8-9 mm hemorrhage in the left  hippocampal region. No abnormal brain enhancement elsewhere. Review of the MIP images confirms the above findings IMPRESSION: No significant large or medium vessel pathology. No evidence of atherosclerotic narrowing. The vessels are tortuous as might be seen with hypertension. There is nonstenotic atherosclerotic calcification in the carotid siphon regions. No evidence of abnormal arterial or venous structure in the region of the left hippocampus where there is an intraparenchymal hemorrhage measuring 8-9 mm. Electronically Signed   By: Nelson Chimes M.D.   On: 07/17/2018 14:19   [name] [place] Contrast  Result Date: 07/18/2018 CLINICAL DATA:  Headaches. On anti coagulation. Left hippocampal hemorrhage. EXAM: MRI HEAD WITHOUT AND WITH CONTRAST TECHNIQUE: Multiplanar, multiecho pulse sequences of the brain and surrounding structures were obtained without and with intravenous contrast. CONTRAST:  6 mL Gadavist COMPARISON:  CTA head neck 07/17/2018 FINDINGS: BRAIN: There is a small focus of magnetic susceptibility effect in the area of the left optic tract and tail of the caudate nucleus. This shows very low T2-weighted signal and intermediate T1-weighted signal. The focus measures 6-8 mm. No acute ischemia. There is no associated contrast enhancement. Minimal surrounding edema. The midline structures are normal. No midline shift or other mass effect. There are no old infarcts. Multifocal white matter hyperintensity, most commonly due to chronic ischemic microangiopathy. The cerebral and cerebellar volume are age-appropriate. Susceptibility-sensitive sequences show no chronic microhemorrhage or superficial siderosis. No abnormal contrast enhancement. VASCULAR: Major intracranial arterial and venous sinus flow voids are normal. SKULL AND UPPER CERVICAL SPINE: Calvarial bone marrow signal is normal. There is no skull base mass. Visualized upper cervical spine and soft tissues are normal. SINUSES/ORBITS: No fluid levels  or advanced mucosal thickening. No mastoid or middle ear effusion. The orbits are normal. IMPRESSION: 1. Subcentimeter focus of hemorrhage, likely early subacute, within the tail of the left caudate nucleus, in close proximity to the left optic tract. No underlying lesion is identified. This may be a hypertensive hemorrhage in the setting of anticoagulation. 2. Mild findings of chronic small vessel ischemia. Electronically Signed   By: Ulyses Jarred M.D.   On: 07/18/2018 03:20   Medications:  . amLODipine  10 mg Oral Daily  . calcitRIOL  0.25 mcg Oral Q T,Th,Sa-HD  . Chlorhexidine Gluconate Cloth  6 each Topical Daily  . Chlorhexidine Gluconate Cloth  6 each Topical Q0600  . fluticasone  2 spray Each Nare Daily  . hydrALAZINE  25 mg Oral Q8H  . insulin aspart  0-9 Units Subcutaneous TID WC  . multivitamin  1 tablet Oral QHS  . pantoprazole  40 mg Oral Daily  . senna-docusate  1 tablet Oral BID  . sodium chloride flush  10-40 mL Intracatheter Q12H    Dialysis Orders: [place] T,Th,S 4 hrs 180NRe 400/Autoflow1.5 59 kg 2.0 K /2.5 Ca UFP 2 -Heparin 2000 units IV TIW (This will need to be Dc'd on discharge!) -Venofer 100 mg IV X 10 doses (has not been started yet Fe 48 Tsat 18 07/08/2018) -Calcitriol 0.25 mcg PO TIW   Assessment/Plan: 1.  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 2.  Noncompliance with HD. Missed HD X 4 treatments No evidence of uremia or volume overload. EGFR 17. HD yesterday and today to resume schedule.  Encouraged adherence. 3.  Polysubstance Abuse-UDA positive for cocaine.  4.  Failed LUA AVF-Placed in 03/05/2018 per Dr. Donnetta Hutching. Never cannulated.VVS plans new vein mapping and access here versus outpt based on course here.   5.  ESRD -  T,Th,S HD yest and today.  6.  Hypertension/volume  - Normotensive overnight. No BB-recent cocaine use. On hydralazine and amlodipine.  7.  Anemia  -HGB 11.8. Hold OP iron  load. Follow HGB.   8.  Metabolic bone disease - Ca corr 9.3. PHos 5.2. No binders on OP med list. Cont VDRA.  9.  Nutrition - Changed to Renal/Carb mod diet, renal vit 10. DM-per primary 11. Polysubstance abuse   Jannifer Hick MD 07/19/2018, 11:28 AM  [facility] Pager: [phone]

## 2018-07-19 NOTE — Progress Notes (Signed)
STROKE TEAM PROGRESS NOTE   SUBJECTIVE (INTERVAL HISTORY) Mild headache.  No nausea.    CT - left parietal periventricular hemorrhage.  CTA negative.  Admits to cocaine near the headache onset.  Also has HTN, which is well controlled on 2 meds at this time.  She is no longer on Coumadin which was for PE 3 months ago.  Her INR is 0.96 today.    OBJECTIVE Temp:  [97.8 F (36.6 C)-98.8 F (37.1 C)] 98.6 F (37 C) (11/09 0329) Pulse Rate:  [65-103] 74 (11/09 0329) Cardiac Rhythm: Normal sinus rhythm (11/08 2001) Resp:  [10-28] 18 (11/09 0329) BP: (104-164)/(61-128) 132/73 (11/09 0329) SpO2:  [93 %-100 %] 97 % (11/09 0329) Weight:  [64.2 kg] 64.2 kg (11/08 1823)  Recent Labs  Lab 07/17/18 1652 07/17/18 2123 07/18/18 0817 07/18/18 1136  GLUCAP 157* 151* 101* 128*   Recent Labs  Lab 07/17/18 0935 07/18/18 1536  NA 141 136  K 4.3 4.8  CL 110 107  CO2 23 25  GLUCOSE 103* 110*  BUN 32* 33*  CREATININE 3.19* 3.22*  CALCIUM 9.1 8.4*  PHOS  --  5.2*   Recent Labs  Lab 07/18/18 1536  ALBUMIN 2.6*   Recent Labs  Lab 07/17/18 0935 07/18/18 1536  WBC 4.4 5.1  NEUTROABS 2.2  --   HGB 13.0 11.4*  HCT 45.9 38.6  MCV 73.8* 73.7*  PLT 124* 120*   No results for input(s): CKTOTAL, CKMB, CKMBINDEX, TROPONINI in the last 168 hours. Recent Labs    07/17/18 0935 07/17/18 1738 07/18/18 0316 07/19/18 0534  LABPROT 33.8* 15.0 13.0 12.7  INR 3.40 1.19 0.99 0.96   No results for input(s): COLORURINE, LABSPEC, PHURINE, GLUCOSEU, HGBUR, BILIRUBINUR, KETONESUR, PROTEINUR, UROBILINOGEN, NITRITE, LEUKOCYTESUR in the last 72 hours.  Invalid input(s): APPERANCEUR     Component Value Date/Time   CHOL  02/20/2010 0520    129        ATP III CLASSIFICATION:  <200     mg/dL   Desirable  200-239  mg/dL   Borderline High  >=240    mg/dL   High          TRIG 82 02/20/2010 0520   HDL 38 (L) 02/20/2010 0520   CHOLHDL 3.4 02/20/2010 0520   VLDL 16 02/20/2010 0520   LDLCALC   02/20/2010 0520    75        Total Cholesterol/HDL:CHD Risk Coronary Heart Disease Risk Table                     Men   Women  1/2 Average Risk   3.4   3.3  Average Risk       5.0   4.4  2 X Average Risk   9.6   7.1  3 X Average Risk  23.4   11.0        Use the calculated Patient Ratio above and the CHD Risk Table to determine the patient's CHD Risk.        ATP III CLASSIFICATION (LDL):  <100     mg/dL   Optimal  100-129  mg/dL   Near or Above                    Optimal  130-159  mg/dL   Borderline  160-189  mg/dL   High  >190     mg/dL   Very High   Lab Results  Component Value Date   HGBA1C 6.8 (  H) 04/02/2018      Component Value Date/Time   LABOPIA POSITIVE (A) 07/17/2018 1511   COCAINSCRNUR POSITIVE (A) 07/17/2018 1511   LABBENZ NONE DETECTED 07/17/2018 1511   AMPHETMU NONE DETECTED 07/17/2018 1511   THCU NONE DETECTED 07/17/2018 1511   LABBARB NONE DETECTED 07/17/2018 1511    No results for input(s): ETH in the last 168 hours.  I have personally reviewed the radiological images below and agree with the radiology interpretations.  Ct Abdomen Pelvis Wo Contrast  Result Date: 06/21/2018 CLINICAL DATA:  62 year old female with history of severe headache. Sepsis. EXAM: CT ABDOMEN AND PELVIS WITHOUT CONTRAST TECHNIQUE: Multidetector CT imaging of the abdomen and pelvis was performed following the standard protocol without IV contrast. COMPARISON:  CT the abdomen and pelvis 04/19/2018. FINDINGS: Lower chest: Cardiomegaly. Small bilateral pleural effusions lying dependently. Patchy multifocal ground-glass attenuation and septal thickening throughout the lung bases bilaterally. Hepatobiliary: No definite cystic or solid hepatic lesions are confidently identified on today's noncontrast CT examination. Status post cholecystectomy. Pancreas: No definite pancreatic mass or peripancreatic fluid or inflammatory changes are noted on today's noncontrast CT examination. Spleen:  Unremarkable. Adrenals/Urinary Tract: Unenhanced appearance of the kidneys and bilateral adrenal glands is normal. No hydroureteronephrosis. Urinary bladder is normal in appearance. Stomach/Bowel: Unenhanced appearance of the stomach is normal. No pathologic dilatation of small bowel or colon. Normal appendix. Vascular/Lymphatic: Aortic atherosclerosis. No lymphadenopathy noted in the abdomen or pelvis. Reproductive: Status post hysterectomy. Right ovary is not confidently identified may be surgically absent or atrophic. 1.8 cm fatty attenuation lesion in the left adnexa, stable compared to numerous prior examinations, most compatible with a small dermoid. Other: No significant volume of ascites.  No pneumoperitoneum. Musculoskeletal: There are no aggressive appearing lytic or blastic lesions noted in the visualized portions of the skeleton. IMPRESSION: 1. No acute findings are noted in the abdomen or pelvis to account for the patient's symptoms. 2. Findings in the lung bases concerning for congestive heart failure, as above. 3. Aortic atherosclerosis. 4. Additional incidental findings, as above. Electronically Signed   By: Vinnie Langton M.D.   On: 06/21/2018 02:58   Ct Angio Head W Or Wo Contrast  Result Date: 07/17/2018 CLINICAL DATA:  Followup intracranial hemorrhage EXAM: CT ANGIOGRAPHY HEAD AND NECK TECHNIQUE: Multidetector CT imaging of the head and neck was performed using the standard protocol during bolus administration of intravenous contrast. Multiplanar CT image reconstructions and MIPs were obtained to evaluate the vascular anatomy. Carotid stenosis measurements (when applicable) are obtained utilizing NASCET criteria, using the distal internal carotid diameter as the denominator. CONTRAST:  166mL ISOVUE-370 IOPAMIDOL (ISOVUE-370) INJECTION 76% COMPARISON:  CT earlier same day FINDINGS: CTA NECK FINDINGS Aortic arch: Aortic atherosclerosis. No aneurysm or dissection. Brachiocephalic branching  pattern is normal without origin stenosis. Right carotid system: Common carotid artery is tortuous but widely patent to the bifurcation. The carotid bifurcation is normal without atherosclerotic plaque, narrowing or irregularity. Cervical ICA is tortuous but widely patent. Left carotid system: Common carotid artery is widely patent to the bifurcation. The carotid bifurcation is normal without soft or calcified plaque. Cervical ICA is tortuous but widely patent. Vertebral arteries: Both subclavian arteries are widely patent. Both vertebral artery origins are widely patent. Both vertebral arteries are widely patent through the cervical region to the foramen magnum. Skeleton: Cervical spondylosis.  No acute bone finding. Other neck: No mass or lymphadenopathy. Upper chest: Mild scarring.  No active process. Review of the MIP images confirms the above findings CTA HEAD  FINDINGS Anterior circulation: Both internal carotid arteries are patent through the skull base and siphon regions. There is atherosclerotic calcification in the carotid siphon regions but no stenosis more than about 20%. The anterior and middle cerebral vessels are patent without proximal stenosis, aneurysm or vascular malformation. There are large posterior communicating arteries on each side. Posterior circulation: Both vertebral arteries are patent through the foramen magnum to the basilar. Posterior inferior cerebellar arteries appear normal. There is no basilar stenosis. Superior cerebellar and posterior cerebral arteries are patent and normal. PCAs receive most of there supply from the anterior circulation. No evidence of abnormal arterial structure in the left hippocampal region. Venous sinuses: Patent and normal. Anatomic variants: None significant. Delayed phase: No abnormal enhancement in the region of the 8-9 mm hemorrhage in the left hippocampal region. No abnormal brain enhancement elsewhere. Review of the MIP images confirms the above  findings IMPRESSION: No significant large or medium vessel pathology. No evidence of atherosclerotic narrowing. The vessels are tortuous as might be seen with hypertension. There is nonstenotic atherosclerotic calcification in the carotid siphon regions. No evidence of abnormal arterial or venous structure in the region of the left hippocampus where there is an intraparenchymal hemorrhage measuring 8-9 mm. Electronically Signed   By: Nelson Chimes M.D.   On: 07/17/2018 14:19   Ct Head Wo Contrast  Result Date: 07/17/2018 CLINICAL DATA:  Headache and left eye pain for 3 weeks. EXAM: CT HEAD WITHOUT CONTRAST TECHNIQUE: Contiguous axial images were obtained from the base of the skull through the vertex without intravenous contrast. COMPARISON:  Head CT scan 06/21/2018. FINDINGS: Brain: The patient has a new small hemorrhage which appears to be deep in the inferior horn of the left lateral ventricle. No mass, hydrocephalus, mass effect or midline shift is identified. No evidence of acute infarct is seen. Vascular: Atherosclerosis is noted. Skull: Intact.  No focal lesion. Sinuses/Orbits: Negative. Other: None. IMPRESSION: Small hemorrhage deep in the inferior horn of the left lateral ventricle. Cause for this abnormality is not identified but note is made that the patient is anticoagulated. Brain MRI and MRI for further evaluation recommended. Critical Value/emergent results were called by telephone at the time of interpretation on 07/17/2018 at 11:14 am to [place], P. A., who verbally acknowledged these results. Electronically Signed   By: Inge Rise M.D.   On: 07/17/2018 11:17   Ct Head Wo Contrast  Result Date: 06/23/2018 CLINICAL DATA:  Initial evaluation for chronic headache. EXAM: CT HEAD WITHOUT CONTRAST TECHNIQUE: Contiguous axial images were obtained from the base of the skull through the vertex without intravenous contrast. COMPARISON:  Prior CT from 06/21/2018. FINDINGS: Brain: Cerebral volume  within normal limits for patient age. No evidence for acute intracranial hemorrhage. No findings to suggest acute large vessel territory infarct. No mass lesion, midline shift, or mass effect. Ventricles are normal in size without evidence for hydrocephalus. No extra-axial fluid collection identified. Vascular: No hyperdense vessel identified. Skull: Scalp soft tissues demonstrate no acute abnormality. Calvarium intact. Sinuses/Orbits: Globes and orbital soft tissues within normal limits. Chronic left maxillary sinusitis. Small retention cyst noted within the left sphenoid sinus. Mastoid air cells are clear. IMPRESSION: 1. No acute intracranial abnormality. 2. Chronic left maxillary sinusitis. Electronically Signed   By: Jeannine Boga M.D.   On: 06/23/2018 22:54   Ct Head Wo Contrast  Result Date: 06/21/2018 CLINICAL DATA:  Thunderclap headache. EXAM: CT HEAD WITHOUT CONTRAST TECHNIQUE: Contiguous axial images were obtained from the base of the  skull through the vertex without intravenous contrast. COMPARISON:  None. FINDINGS: Brain: There is no mass, hemorrhage or extra-axial collection. The size and configuration of the ventricles and extra-axial CSF spaces are normal. There is no acute or chronic infarction. The brain parenchyma is normal. Vascular: No abnormal hyperdensity of the major intracranial arteries or dural venous sinuses. No intracranial atherosclerosis. Skull: The visualized skull base, calvarium and extracranial soft tissues are normal. Sinuses/Orbits: Mild left maxillary sinus mucosal thickening. No mastoid or middle ear effusion. The orbits are normal. IMPRESSION: Normal brain. Electronically Signed   By: Ulyses Jarred M.D.   On: 06/21/2018 02:32   Ct Angio Neck W Or Wo Contrast  Result Date: 07/17/2018 CLINICAL DATA:  Followup intracranial hemorrhage EXAM: CT ANGIOGRAPHY HEAD AND NECK TECHNIQUE: Multidetector CT imaging of the head and neck was performed using the standard protocol  during bolus administration of intravenous contrast. Multiplanar CT image reconstructions and MIPs were obtained to evaluate the vascular anatomy. Carotid stenosis measurements (when applicable) are obtained utilizing NASCET criteria, using the distal internal carotid diameter as the denominator. CONTRAST:  184mL ISOVUE-370 IOPAMIDOL (ISOVUE-370) INJECTION 76% COMPARISON:  CT earlier same day FINDINGS: CTA NECK FINDINGS Aortic arch: Aortic atherosclerosis. No aneurysm or dissection. Brachiocephalic branching pattern is normal without origin stenosis. Right carotid system: Common carotid artery is tortuous but widely patent to the bifurcation. The carotid bifurcation is normal without atherosclerotic plaque, narrowing or irregularity. Cervical ICA is tortuous but widely patent. Left carotid system: Common carotid artery is widely patent to the bifurcation. The carotid bifurcation is normal without soft or calcified plaque. Cervical ICA is tortuous but widely patent. Vertebral arteries: Both subclavian arteries are widely patent. Both vertebral artery origins are widely patent. Both vertebral arteries are widely patent through the cervical region to the foramen magnum. Skeleton: Cervical spondylosis.  No acute bone finding. Other neck: No mass or lymphadenopathy. Upper chest: Mild scarring.  No active process. Review of the MIP images confirms the above findings CTA HEAD FINDINGS Anterior circulation: Both internal carotid arteries are patent through the skull base and siphon regions. There is atherosclerotic calcification in the carotid siphon regions but no stenosis more than about 20%. The anterior and middle cerebral vessels are patent without proximal stenosis, aneurysm or vascular malformation. There are large posterior communicating arteries on each side. Posterior circulation: Both vertebral arteries are patent through the foramen magnum to the basilar. Posterior inferior cerebellar arteries appear normal.  There is no basilar stenosis. Superior cerebellar and posterior cerebral arteries are patent and normal. PCAs receive most of there supply from the anterior circulation. No evidence of abnormal arterial structure in the left hippocampal region. Venous sinuses: Patent and normal. Anatomic variants: None significant. Delayed phase: No abnormal enhancement in the region of the 8-9 mm hemorrhage in the left hippocampal region. No abnormal brain enhancement elsewhere. Review of the MIP images confirms the above findings IMPRESSION: No significant large or medium vessel pathology. No evidence of atherosclerotic narrowing. The vessels are tortuous as might be seen with hypertension. There is nonstenotic atherosclerotic calcification in the carotid siphon regions. No evidence of abnormal arterial or venous structure in the region of the left hippocampus where there is an intraparenchymal hemorrhage measuring 8-9 mm. Electronically Signed   By: Nelson Chimes M.D.   On: 07/17/2018 14:19   [name] RW Contrast  Result Date: 07/18/2018 CLINICAL DATA:  Headaches. On anti coagulation. Left hippocampal hemorrhage. EXAM: MRI HEAD WITHOUT AND WITH CONTRAST TECHNIQUE: Multiplanar, multiecho pulse  sequences of the brain and surrounding structures were obtained without and with intravenous contrast. CONTRAST:  6 mL Gadavist COMPARISON:  CTA head neck 07/17/2018 FINDINGS: BRAIN: There is a small focus of magnetic susceptibility effect in the area of the left optic tract and tail of the caudate nucleus. This shows very low T2-weighted signal and intermediate T1-weighted signal. The focus measures 6-8 mm. No acute ischemia. There is no associated contrast enhancement. Minimal surrounding edema. The midline structures are normal. No midline shift or other mass effect. There are no old infarcts. Multifocal white matter hyperintensity, most commonly due to chronic ischemic microangiopathy. The cerebral and cerebellar volume are  age-appropriate. Susceptibility-sensitive sequences show no chronic microhemorrhage or superficial siderosis. No abnormal contrast enhancement. VASCULAR: Major intracranial arterial and venous sinus flow voids are normal. SKULL AND UPPER CERVICAL SPINE: Calvarial bone marrow signal is normal. There is no skull base mass. Visualized upper cervical spine and soft tissues are normal. SINUSES/ORBITS: No fluid levels or advanced mucosal thickening. No mastoid or middle ear effusion. The orbits are normal. IMPRESSION: 1. Subcentimeter focus of hemorrhage, likely early subacute, within the tail of the left caudate nucleus, in close proximity to the left optic tract. No underlying lesion is identified. This may be a hypertensive hemorrhage in the setting of anticoagulation. 2. Mild findings of chronic small vessel ischemia. Electronically Signed   By: Ulyses Jarred M.D.   On: 07/18/2018 03:20   Dg Chest Port 1 View  Result Date: 06/22/2018 CLINICAL DATA:  Shortness of breath EXAM: PORTABLE CHEST 1 VIEW COMPARISON:  06/20/2018 FINDINGS: Cardiac shadow is enlarged. Dialysis catheter is again seen. Persistent right basilar infiltrate is noted although significantly improved when compared with the prior exam. No significant interstitial edema is noted. No bony abnormality is seen. IMPRESSION: Significant improved aeration in the right base. Electronically Signed   By: Inez Catalina M.D.   On: 06/22/2018 12:38   Dg Chest Port 1 View  Result Date: 06/20/2018 CLINICAL DATA:  Initial evaluation for acute shortness of breath, cough. EXAM: PORTABLE CHEST 1 VIEW COMPARISON:  Prior radiograph from 06/07/2018. FINDINGS: Right-sided dual lumen hemodialysis catheter in place with tips overlying the cavoatrial junction. Moderate cardiomegaly, stable. Mediastinal silhouette within normal limits. Aortic atherosclerosis. Lungs mildly hypoinflated. Mild diffuse pulmonary vascular and interstitial congestion. Pat superimposed patchy  right lower lobe opacity suspicious for possible infiltrate. Associated small moderate right pleural effusion with subpulmonic component. No pneumothorax. No acute osseous abnormality. IMPRESSION: 1. Patchy right lower lobe opacity, concerning for pneumonia given provided history of cough. This has worsened from 06/07/2018. 2. Associated small to moderate right pleural effusion. 3. Cardiomegaly with underlying mild diffuse pulmonary interstitial congestion/edema. 4. Aortic atherosclerosis. Electronically Signed   By: Jeannine Boga M.D.   On: 06/20/2018 22:00    TEE 04/29/18 - Left ventricle: Systolic function was mildly to moderately   reduced. The estimated ejection fraction was in the range of 40%   to 45%. Mild diffuse hypokinesis. - Aortic valve: No evidence of vegetation. - Mitral valve: No evidence of vegetation. There was mild   regurgitation. - Left atrium: The atrium was mildly to moderately dilated. No   evidence of thrombus in the atrial cavity or appendage. - Right atrium: The atrium was mildly to moderately dilated. No   evidence of thrombus in the atrial cavity or appendage. - Atrial septum: No defect or patent foramen ovale was identified. - Tricuspid valve: The posterior leaflet of the TV appears   thickened but no obvious  mobile density. There is moderate   eccentric TR directed towards the interatrial septum. Suspicious   for possible TV vegetation. - Pulmonic valve: No evidence of vegetation.    PHYSICAL EXAM  Temp:  [97.8 F (36.6 C)-98.8 F (37.1 C)] 98.6 F (37 C) (11/09 0329) Pulse Rate:  [65-103] 74 (11/09 0329) Resp:  [10-28] 18 (11/09 0329) BP: (104-164)/(61-128) 132/73 (11/09 0329) SpO2:  [93 %-100 %] 97 % (11/09 0329) Weight:  [64.2 kg] 64.2 kg (11/08 1823)  Non-focal neurological exam.   Fully oriented, awake, alert.   ASSESSMENT/PLAN Rhonda Lynch is a 62 y.o. 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 No tPA given due to [place].    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  CTA head and neck - unremarkable  TEE 04/2018 - EF 40-45%, no vegetation  LDL pending  HgbA1c pending  INR 3.4->0.99  SCDs for VTE prophylaxis  warfarin daily prior to admission, now on No antithrombotic. Coumadin was reversed with Kcentra  Ongoing aggressive stroke risk factor management  Therapy recommendations:  Pending   Disposition:  Pending   Hx of PE on coumadin  Diagnosed in 8//18/2019 with VQ scan in the setting of fungal infection  Put on coumadin since  INR has been stable but found to have INR 3.40 on admission  There is no mentioning in previous charts regarding how long the coumadin will need  It is almost 3 months now for treatment, given the ICH, I would advocate to discontinue coumadin at this time  Diabetes  HgbA1c pending goal < 7.0  Controlled  Glucose within range   CBG monitoring  SSI  Hypertension . Stable on cleviprex . BP goal < 160 . Will avoid home metoprolol for now given UDS positive on cocaine . Add amlodipine, and hydralazine . Taper off cleviprex as able  Long term BP goal normotensive  ESRD on HD  Noncompliance  Missed HD for a week  Nephrology on board  Will do HD today which will also help for HTN  BMP monitoring  Cocaine abuse   last cocaine use according to pt was 1 month ago  Cessation education given  UDS positive for cocaine  Will avoid metoprolol for now  Pt is willing to quit  Other Stroke Risk Factors  Advanced age  Cigarette smoker, quit 3 months ago  Migraines  Other Active Problems  Noncompliance with meds and HD  Hospital day # 2  This patient is critically ill  due to [place] on coumadin, hypertensive emergency, cocaine abuse and at significant risk of neurological worsening, death form recurrent bleeding, cerebral edema, seizure, hypertensive encephalopathy, cocaine withdraw. This patient's care requires constant monitoring of vital signs, hemodynamics, respiratory and cardiac monitoring, review of multiple databases, neurological assessment, discussion with family, other specialists and medical decision making of high complexity. I spent 40 minutes of neurocritical care time in the care of this patient.  Acute ICH secondary to combination of HTN, coumadin , and cocaine.  No significant mass effect.  BP is well controlled.  INR has been reversed with [facility]  Will likely discharge soon to follow up in stroke clinic.    Rogue Jury, MS, MD  To contact Stroke Continuity provider, please refer to [email or web address] After hours, contact General Neurology

## 2018-07-19 NOTE — Progress Notes (Signed)
*  PRELIMINARY RESULTS* Vascular Ultrasound Duplex Dialysis Access (AVF, AGV) has been completed.  The left brachiocephalic AVF was visualized. The anastomosis exhibits velocities >466/358 cm/s, suggestive of stenosis. The cephalic vein exhibits poor flow with evidence of subacute thrombus.     Preliminary results discussed with Dr. Carlis Abbott  07/19/2018 10:38 AM Maudry Mayhew, MHA, RVT, RDCS, RDMS

## 2018-07-19 NOTE — Progress Notes (Signed)
P.t. Left 3W to go to dialysis.

## 2018-07-19 NOTE — Progress Notes (Signed)
Patient complaining of itching. Paged Dr Cheral Marker and requested medication for patient. Awaiting answer.

## 2018-07-20 ENCOUNTER — Inpatient Hospital Stay (HOSPITAL_COMMUNITY): Payer: Medicare Other

## 2018-07-20 LAB — GLUCOSE, CAPILLARY
GLUCOSE-CAPILLARY: 93 mg/dL (ref 70–99)
GLUCOSE-CAPILLARY: 106 mg/dL — AB (ref 70–99)
GLUCOSE-CAPILLARY: 106 mg/dL — AB (ref 70–99)
Glucose-Capillary: 149 mg/dL — ABNORMAL HIGH (ref 70–99)

## 2018-07-20 LAB — BASIC METABOLIC PANEL
Anion gap: 8 (ref 5–15)
BUN: 10 mg/dL (ref 8–23)
CO2: 27 mmol/L (ref 22–32)
CREATININE: 2.29 mg/dL — AB (ref 0.44–1.00)
Calcium: 8.9 mg/dL (ref 8.9–10.3)
Chloride: 99 mmol/L (ref 98–111)
GFR, EST AFRICAN AMERICAN: 25 mL/min — AB (ref >60)
GFR, EST NON AFRICAN AMERICAN: 21 mL/min — AB (ref >60)
Glucose, Bld: 93 mg/dL (ref 70–99)
Potassium: 3.9 mmol/L (ref 3.5–5.1)
SODIUM: 134 mmol/L — AB (ref 135–145)

## 2018-07-20 LAB — CBC
HCT: 40.4 % (ref 36.0–46.0)
HEMOGLOBIN: 12.3 g/dL (ref 12.0–15.0)
MCH: 21.6 pg — AB (ref 26.0–34.0)
MCHC: 30.4 g/dL (ref 30.0–36.0)
MCV: 71 fL — ABNORMAL LOW (ref 80.0–100.0)
PLATELETS: 116 10*3/uL — AB (ref 150–400)
RBC: 5.69 MIL/uL — AB (ref 3.87–5.11)
RDW: 20.4 % — ABNORMAL HIGH (ref 11.5–15.5)
WBC: 5.2 10*3/uL (ref 4.0–10.5)
nRBC: 0 % (ref 0.0–0.2)

## 2018-07-20 LAB — PROTIME-INR
INR: 0.96
PROTHROMBIN TIME: 12.7 s (ref 11.4–15.2)

## 2018-07-20 MED ORDER — TRAMADOL HCL 50 MG PO TABS
100.0000 mg | ORAL_TABLET | Freq: Once | ORAL | Status: AC
  Administered 2018-07-20: 100 mg via ORAL
  Filled 2018-07-20: qty 2

## 2018-07-20 MED ORDER — FENTANYL CITRATE (PF) 100 MCG/2ML IJ SOLN
12.5000 ug | Freq: Once | INTRAMUSCULAR | Status: AC
  Administered 2018-07-21: 12.5 ug via INTRAVENOUS
  Filled 2018-07-20: qty 2

## 2018-07-20 MED ORDER — PROMETHAZINE HCL 25 MG/ML IJ SOLN
25.0000 mg | Freq: Once | INTRAMUSCULAR | Status: AC
  Administered 2018-07-20: 25 mg via INTRAVENOUS
  Filled 2018-07-20: qty 1

## 2018-07-20 NOTE — Progress Notes (Addendum)
[facility] Progress Note   Subjective:  Tol HD yesterday without issue. Feels well. No complaints.  AV access surgery for tomorrow with Dr. Carlis Abbott.   Objective Vitals:   07/19/18 2324 07/20/18 0326 07/20/18 0356 07/20/18 0744  BP: (!) 142/88 (!) 147/98 (!) 146/85 (!) 140/98  Pulse: 78 83 72 (!) 101  Resp: 18 18  18   Temp: 98.4 F (36.9 C) 98.6 F (37 C)  98.2 F (36.8 C)  TempSrc: Oral Oral  Oral  SpO2: 97% 100%  100%  Weight:      Height:       Physical Exam General: Sitting up doing crossword Heart: RRR Lungs: normal WOB Abdomen: soft Extremities: no edema Dialysis Access: R[facility] c/d/i  Additional Objective Labs: Basic Metabolic Panel: Recent Labs  Lab 07/18/18 1536 07/19/18 0534 07/20/18 0517  NA 136 134* 134*  K 4.8 4.3 3.9  CL 107 102 99  CO2 25 24 27   GLUCOSE 110* 75 93  BUN 33* 18 10  CREATININE 3.22* 4.30* 2.29*  CALCIUM 8.4* 8.6* 8.9  PHOS 5.2*  --   --    Liver Function Tests: Recent Labs  Lab 07/18/18 1536  ALBUMIN 2.6*   No results for input(s): LIPASE, AMYLASE in the last 168 hours. CBC: Recent Labs  Lab 07/17/18 0935 07/18/18 1536 07/19/18 0534 07/20/18 0517  WBC 4.4 5.1 5.8 5.2  NEUTROABS 2.2  --   --   --   HGB 13.0 11.4* 11.8* 12.3  HCT 45.9 38.6 39.7 40.4  MCV 73.8* 73.7* 71.4* 71.0*  PLT 124* 120* 113* 116*   Blood Culture    Component Value Date/Time   SDES BLOOD RIGHT HAND 06/21/2018 0027   SPECREQUEST  06/21/2018 0027    BOTTLES DRAWN AEROBIC ONLY Blood Culture adequate volume   CULT  06/21/2018 0027    NO GROWTH 5 DAYS Performed at [facility], [facility] [address] [place], [place] [address]    REPTSTATUS 06/26/2018 FINAL 06/21/2018 0027    Cardiac Enzymes: No results for input(s): CKTOTAL, CKMB, CKMBINDEX, TROPONINI in the last 168 hours. CBG: Recent Labs  Lab 07/18/18 1136 07/19/18 0833 07/19/18 1130 07/19/18 2131 07/20/18 0612  GLUCAP 128* 95 96 142* 93   Iron Studies: No results  for input(s): IRON, TIBC, TRANSFERRIN, FERRITIN in the last 72 hours. @lablastinr3 @ Studies/Results: Vas US Duplex Dialysis Access (avf, Avg)  Result Date: 07/20/2018 DIALYSIS ACCESS Reason for Exam: No palpable thrill for AVF/AVG. Access Site: Left Upper Extremity. Access Type: Brachial-cephalic AVF. Performing Technologist: Maudry Mayhew MHA, RDMS, RVT, RDCS  Examination Guidelines: A complete evaluation includes B-mode imaging, spectral Doppler, color Doppler, and power Doppler as needed of all accessible portions of each vessel. Unilateral testing is considered an integral part of a complete examination. Limited examinations for reoccurring indications may be performed as noted.  Findings: +--------------------+----------+-----------------+--------+ AVF                 PSV (cm/s)Flow Vol (mL/min)Comments +--------------------+----------+-----------------+--------+ Native artery inflow   105           65                 +--------------------+----------+-----------------+--------+ AVF Anastomosis        466           358                +--------------------+----------+-----------------+--------+  +------------+----------+-------------+----------+-------------------+ OUTFLOW VEINPSV (cm/s)Diameter (cm)Depth (cm)     Describe       +------------+----------+-------------+----------+-------------------+  Prox UA         8                                                +------------+----------+-------------+----------+-------------------+ Mid UA          62                                               +------------+----------+-------------+----------+-------------------+ Dist UA         52                           partially-occlusive +------------+----------+-------------+----------+-------------------+ Dist Forearm    47                                               +------------+----------+-------------+----------+-------------------+   Summary: The left  brachiocephalic AVF was visualized. The anastomosis exhibits velocities >466/358 cm/s, suggestive of stenosis. The cephalic vein exhibits poor flow with evidence of subacute thrombus.  *See table(s) above for measurements and observations.  Diagnosing physician: Monica Martinez MD Electronically signed by Monica Martinez MD on 07/20/2018 at 10:03:54 AM.   --------------------------------------------------------------------------------   Final    Vas [place] Upper Ext Vein Mapping (pre-op Avf)  Result Date: 07/20/2018 UPPER EXTREMITY VEIN MAPPING  Indications: Pre-access. Performing Technologist: Maudry Mayhew MHA, RDMS, RVT, RDCS  Examination Guidelines: A complete evaluation includes B-mode imaging, spectral Doppler, color Doppler, and power Doppler as needed of all accessible portions of each vessel. Bilateral testing is considered an integral part of a complete examination. Limited examinations for reoccurring indications may be performed as noted. +-----------------+-------------+----------+---------+ Right Cephalic   Diameter (cm)Depth (cm)Findings  +-----------------+-------------+----------+---------+ Shoulder             0.31        0.97             +-----------------+-------------+----------+---------+ Prox upper arm       0.36        0.91             +-----------------+-------------+----------+---------+ Mid upper arm        0.19        0.89   branching +-----------------+-------------+----------+---------+ Dist upper arm       0.26        0.57             +-----------------+-------------+----------+---------+ Antecubital fossa    0.32        0.15             +-----------------+-------------+----------+---------+ Prox forearm         0.28        0.53             +-----------------+-------------+----------+---------+ Mid forearm          0.19        0.11   branching +-----------------+-------------+----------+---------+ Dist forearm         0.19         0.17             +-----------------+-------------+----------+---------+ Wrist  0.16        0.23             +-----------------+-------------+----------+---------+ +-----------------+-------------+----------+--------------+ Right Basilic    Diameter (cm)Depth (cm)   Findings    +-----------------+-------------+----------+--------------+ Prox upper arm       0.52                              +-----------------+-------------+----------+--------------+ Mid upper arm        0.49                              +-----------------+-------------+----------+--------------+ Dist upper arm       0.50                              +-----------------+-------------+----------+--------------+ Antecubital fossa    0.18                              +-----------------+-------------+----------+--------------+ Prox forearm         0.16                              +-----------------+-------------+----------+--------------+ Mid forearm          0.14                              +-----------------+-------------+----------+--------------+ Distal forearm                          not visualized +-----------------+-------------+----------+--------------+ Elbow                                     branching    +-----------------+-------------+----------+--------------+ Wrist                                   not visualized +-----------------+-------------+----------+--------------+ +-----------------+-------------+----------+--------------+ Left Cephalic    Diameter (cm)Depth (cm)   Findings    +-----------------+-------------+----------+--------------+ Shoulder             0.44        1.05                  +-----------------+-------------+----------+--------------+ Prox upper arm       0.40        0.71                  +-----------------+-------------+----------+--------------+ Mid upper arm        0.40        0.62     branching     +-----------------+-------------+----------+--------------+ Dist upper arm       0.28        0.43                  +-----------------+-------------+----------+--------------+ Antecubital fossa    0.47        0.29        AVF       +-----------------+-------------+----------+--------------+ Prox forearm         0.31        1.07                  +-----------------+-------------+----------+--------------+  Mid forearm                             not visualized +-----------------+-------------+----------+--------------+ Dist forearm                            not visualized +-----------------+-------------+----------+--------------+ Wrist                                   not visualized +-----------------+-------------+----------+--------------+ +-----------------+-------------+----------+---------+ Left Basilic     Diameter (cm)Depth (cm)Findings  +-----------------+-------------+----------+---------+ Prox upper arm       0.47                         +-----------------+-------------+----------+---------+ Mid upper arm        0.45                         +-----------------+-------------+----------+---------+ Dist upper arm       0.48               branching +-----------------+-------------+----------+---------+ Antecubital fossa    0.39                         +-----------------+-------------+----------+---------+ Prox forearm         0.34                         +-----------------+-------------+----------+---------+ Mid forearm          0.33                         +-----------------+-------------+----------+---------+ Distal forearm       0.31                         +-----------------+-------------+----------+---------+ Wrist                0.19                         +-----------------+-------------+----------+---------+ *See table(s) above for measurements and observations.  Diagnosing physician: Monica Martinez MD Electronically signed by  Monica Martinez MD on 07/20/2018 at 10:01:29 AM.    Final    Medications:  . amLODipine  10 mg Oral Daily  . calcitRIOL  0.25 mcg Oral Q T,Th,Sa-HD  . Chlorhexidine Gluconate Cloth  6 each Topical Daily  . fluticasone  2 spray Each Nare Daily  . hydrALAZINE  25 mg Oral Q8H  . insulin aspart  0-9 Units Subcutaneous TID WC  . multivitamin  1 tablet Oral QHS  . pantoprazole  40 mg Oral Daily  . senna-docusate  1 tablet Oral BID  . sodium chloride flush  10-40 mL Intracatheter Q12H    Dialysis Orders: [place] T,Th,S 4 hrs 180NRe 400/Autoflow1.5 59 kg 2.0 K /2.5 Ca UFP 2 -Heparin 2000 units IV TIW (This will need to be Dc'd on discharge!) -Venofer 100 mg IV X 10 doses (has not been started yet Fe 48 Tsat 18 07/08/2018) -Calcitriol 0.25 mcg PO TIW   Assessment/Plan: 1.  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 2.  Noncompliance with HD. Missed HD X 4 treatments prior to admission.  HD Friday and Sat, next will be Tuesday.  3.  Polysubstance Abuse-UDA positive for cocaine.  4.  Failed LUA AVF-Placed in 03/05/2018 per Dr. Donnetta Hutching. Never cannulated.[facility]VS planning new access placement tomorrow.    5.  ESRD -  T,Th,S HD.  Had HD Friday and Sat due to missed treatments. Tol well Next Tuesday.  6.  Hypertension/volume  - Normotensive yesterday, in the 140/80s today. No BB-recent cocaine use. On hydralazine and amlodipine. Monitor - may need meds titrated. 7.  Anemia  -HGB 12.3. Hold OP iron load. Follow HGB.   8.  Metabolic bone disease - Ca corr 9.3. PHos 5.2. No binders on OP med list. Cont VDRA.  9.  Nutrition - Changed to Renal/Carb mod diet, renal vit 10. DM-per primary   Jannifer Hick MD 07/20/2018, 10:53 AM  [facility] Pager: [phone]

## 2018-07-20 NOTE — Progress Notes (Signed)
STROKE TEAM PROGRESS NOTE   SUBJECTIVE (INTERVAL HISTORY) Mild headache earlier.  No nausea.    CT - left parietal periventricular hemorrhage.  CTA negative.  Admits to cocaine in the recent past.  HTN is well controlled on 2 meds at this time.  She is no longer on Coumadin which was for PE 3 months ago.  Her INR is 0.96 today.    AVF surgery for HD tomorrow.    OBJECTIVE Temp:  [98 F (36.7 C)-98.6 F (37 C)] 98.6 F (37 C) (11/10 1558) Pulse Rate:  [72-102] 80 (11/10 1558) Cardiac Rhythm: Normal sinus rhythm (11/10 0704) Resp:  [17-20] 17 (11/10 1558) BP: (125-147)/(78-98) 142/80 (11/10 1558) SpO2:  [97 %-100 %] 100 % (11/10 1558)  Recent Labs  Lab 07/19/18 1130 07/19/18 2131 07/20/18 0612 07/20/18 1139 07/20/18 1556  GLUCAP 96 142* 93 149* 106*   Recent Labs  Lab 07/17/18 0935 07/18/18 1536 07/19/18 0534 07/20/18 0517  NA 141 136 134* 134*  K 4.3 4.8 4.3 3.9  CL 110 107 102 99  CO2 23 25 24 27   GLUCOSE 103* 110* 75 93  BUN 32* 33* 18 10  CREATININE 3.19* 3.22* 4.30* 2.29*  CALCIUM 9.1 8.4* 8.6* 8.9  PHOS  --  5.2*  --   --    Recent Labs  Lab 07/18/18 1536  ALBUMIN 2.6*   Recent Labs  Lab 07/17/18 0935 07/18/18 1536 07/19/18 0534 07/20/18 0517  WBC 4.4 5.1 5.8 5.2  NEUTROABS 2.2  --   --   --   HGB 13.0 11.4* 11.8* 12.3  HCT 45.9 38.6 39.7 40.4  MCV 73.8* 73.7* 71.4* 71.0*  PLT 124* 120* 113* 116*   No results for input(s): CKTOTAL, CKMB, CKMBINDEX, TROPONINI in the last 168 hours. Recent Labs    07/18/18 0316 07/19/18 0534 07/20/18 0517  LABPROT 13.0 12.7 12.7  INR 0.99 0.96 0.96   No results for input(s): COLORURINE, LABSPEC, PHURINE, GLUCOSEU, HGBUR, BILIRUBINUR, KETONESUR, PROTEINUR, UROBILINOGEN, NITRITE, LEUKOCYTESUR in the last 72 hours.  Invalid input(s): APPERANCEUR     Component Value Date/Time   CHOL 226 (H) 07/19/2018 0534   TRIG 127 07/19/2018 0534   HDL 59 07/19/2018 0534   CHOLHDL 3.8 07/19/2018 0534   VLDL 25  07/19/2018 0534   LDLCALC 142 (H) 07/19/2018 0534   Lab Results  Component Value Date   HGBA1C 6.1 (H) 07/19/2018      Component Value Date/Time   LABOPIA POSITIVE (A) 07/17/2018 1511   COCAINSCRNUR POSITIVE (A) 07/17/2018 1511   LABBENZ NONE DETECTED 07/17/2018 1511   AMPHETMU NONE DETECTED 07/17/2018 1511   THCU NONE DETECTED 07/17/2018 1511   LABBARB NONE DETECTED 07/17/2018 1511    No results for input(s): ETH in the last 168 hours.  IMAGING  Ct Abdomen Pelvis Wo Contrast 06/21/2018 IMPRESSION:  1. No acute findings are noted in the abdomen or pelvis to account for the patient's symptoms.  2. Findings in the lung bases concerning for congestive heart failure, as above.  3. Aortic atherosclerosis.  4. Additional incidental findings, as above.     Ct Angio Head W Or Wo Contrast Ct Angio Neck W Or Wo Contrast 07/17/2018 IMPRESSION:  No significant large or medium vessel pathology. No evidence of atherosclerotic narrowing. The vessels are tortuous as might be seen with hypertension. There is nonstenotic atherosclerotic calcification in the carotid siphon regions. No evidence of abnormal arterial or venous structure in the region of the left hippocampus where there is an  intraparenchymal hemorrhage measuring 8-9 mm.     Ct Head Wo Contrast 07/17/2018 IMPRESSION:  Small hemorrhage deep in the inferior horn of the left lateral ventricle. Cause for this abnormality is not identified but note is made that the patient is anticoagulated. Brain MRI and MRI for further evaluation recommended.   Ct Head Wo Contrast 06/23/2018 IMPRESSION:  1. No acute intracranial abnormality.  2. Chronic left maxillary sinusitis.     Ct Head Wo Contrast 06/21/2018 IMPRESSION:  Normal brain.     [name] Wo Contrast 07/18/2018 IMPRESSION:  1. Subcentimeter focus of hemorrhage, likely early subacute, within the tail of the left caudate nucleus, in close proximity to the left optic  tract. No underlying lesion is identified. This may be a hypertensive hemorrhage in the setting of anticoagulation.  2. Mild findings of chronic small vessel ischemia.     Dg Chest Port 1 View 06/22/2018 IMPRESSION:  Significant improved aeration in the right base.    Dg Chest Port 1 View 06/20/2018 IMPRESSION:  1. Patchy right lower lobe opacity, concerning for pneumonia given provided history of cough. This has worsened from 06/07/2018.  2. Associated small to moderate right pleural effusion.  3. Cardiomegaly with underlying mild diffuse pulmonary interstitial congestion/edema.  4. Aortic atherosclerosis.     Vas US Duplex Dialysis Access (avf, Avg) 07/20/2018 Summary:  The left brachiocephalic AVF was visualized. The anastomosis exhibits velocities >466/358 cm/s, suggestive of stenosis. The cephalic vein exhibits poor flow with evidence of subacute thrombus.      Vas [place] Upper Ext Vein Mapping (pre-op Avf) 07/20/2018 UPPER EXTREMITY VEIN MAPPING  Indications: Pre-access. Performing Technologist: Maudry Mayhew MHA, RDMS, RVT, RDCS  Examination Guidelines: A complete evaluation includes B-mode imaging, spectral Doppler, color Doppler, and power Doppler as needed of all accessible portions of each vessel. Bilateral testing is considered an integral part of a complete examination. Limited examinations for reoccurring indications may be performed as noted. +-----------------+-------------+----------+---------+ Right Cephalic   Diameter (cm)Depth (cm)Findings  +-----------------+-------------+----------+---------+ Shoulder             0.31        0.97             +-----------------+-------------+----------+---------+ Prox upper arm       0.36        0.91             +-----------------+-------------+----------+---------+ Mid upper arm        0.19        0.89   branching +-----------------+-------------+----------+---------+ Dist upper arm       0.26        0.57              +-----------------+-------------+----------+---------+ Antecubital fossa    0.32        0.15             +-----------------+-------------+----------+---------+ Prox forearm         0.28        0.53             +-----------------+-------------+----------+---------+ Mid forearm          0.19        0.11   branching +-----------------+-------------+----------+---------+ Dist forearm         0.19        0.17             +-----------------+-------------+----------+---------+ Wrist                0.16  0.23             +-----------------+-------------+----------+---------+ +-----------------+-------------+----------+--------------+ Right Basilic    Diameter (cm)Depth (cm)   Findings    +-----------------+-------------+----------+--------------+ Prox upper arm       0.52                              +-----------------+-------------+----------+--------------+ Mid upper arm        0.49                              +-----------------+-------------+----------+--------------+ Dist upper arm       0.50                              +-----------------+-------------+----------+--------------+ Antecubital fossa    0.18                              +-----------------+-------------+----------+--------------+ Prox forearm         0.16                              +-----------------+-------------+----------+--------------+ Mid forearm          0.14                              +-----------------+-------------+----------+--------------+ Distal forearm                          not visualized +-----------------+-------------+----------+--------------+ Elbow                                     branching    +-----------------+-------------+----------+--------------+ Wrist                                   not visualized +-----------------+-------------+----------+--------------+ +-----------------+-------------+----------+--------------+ Left Cephalic     Diameter (cm)Depth (cm)   Findings    +-----------------+-------------+----------+--------------+ Shoulder             0.44        1.05                  +-----------------+-------------+----------+--------------+ Prox upper arm       0.40        0.71                  +-----------------+-------------+----------+--------------+ Mid upper arm        0.40        0.62     branching    +-----------------+-------------+----------+--------------+ Dist upper arm       0.28        0.43                  +-----------------+-------------+----------+--------------+ Antecubital fossa    0.47        0.29        AVF       +-----------------+-------------+----------+--------------+ Prox forearm         0.31        1.07                  +-----------------+-------------+----------+--------------+  Mid forearm                             not visualized +-----------------+-------------+----------+--------------+ Dist forearm                            not visualized +-----------------+-------------+----------+--------------+ Wrist                                   not visualized +-----------------+-------------+----------+--------------+ +-----------------+-------------+----------+---------+ Left Basilic     Diameter (cm)Depth (cm)Findings  +-----------------+-------------+----------+---------+ Prox upper arm       0.47                         +-----------------+-------------+----------+---------+ Mid upper arm        0.45                         +-----------------+-------------+----------+---------+ Dist upper arm       0.48               branching +-----------------+-------------+----------+---------+ Antecubital fossa    0.39                         +-----------------+-------------+----------+---------+ Prox forearm         0.34                         +-----------------+-------------+----------+---------+ Mid forearm          0.33                          +-----------------+-------------+----------+---------+ Distal forearm       0.31                         +-----------------+-------------+----------+---------+ Wrist                0.19                         +-----------------+-------------+----------+---------+ *See table(s) above for measurements and observations.  Diagnosing physician: Monica Martinez MD Electronically signed by Monica Martinez MD on 07/20/2018 at 10:01:29 AM.    Final     TEE  04/29/18 - Left ventricle: Systolic function was mildly to moderately   reduced. The estimated ejection fraction was in the range of 40%   to 45%. Mild diffuse hypokinesis. - Aortic valve: No evidence of vegetation. - Mitral valve: No evidence of vegetation. There was mild   regurgitation. - Left atrium: The atrium was mildly to moderately dilated.    No  evidence of thrombus in the atrial cavity or appendage. - Right atrium: The atrium was mildly to moderately dilated. No   evidence of thrombus in the atrial cavity or appendage. - Atrial septum: No defect or patent foramen ovale was identified. - Tricuspid valve: The posterior leaflet of the TV appears   thickened but no obvious mobile density. There is moderate   eccentric TR directed towards the interatrial septum. Suspicious   for possible TV vegetation. - Pulmonic valve: No evidence of vegetation.    PHYSICAL EXAM  Temp:  [98 F (36.7 C)-98.6 F (37 C)] 98.6 F (37 C) (11/10 1558) Pulse  Rate:  [72-102] 80 (11/10 1558) Resp:  [17-20] 17 (11/10 1558) BP: (125-147)/(78-98) 142/80 (11/10 1558) SpO2:  [97 %-100 %] 100 % (11/10 1558)  Non-focal neurological exam.   Fully oriented, awake, alert.   ASSESSMENT/PLAN Rhonda Lynch is a 62 y.o. 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 No tPA given due to [place].    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  CTA head and neck - unremarkable  TEE - 04/2018 - EF 40-45% Suspicious for possible TV vegetation  LDL - 226  HgbA1c - 6.1  INR 3.4->0.99  SCDs for VTE prophylaxis  warfarin daily prior to admission, now on No antithrombotic. Coumadin was reversed with Kcentra  Ongoing aggressive stroke risk factor management  Therapy recommendations:  Pending   Disposition:  Pending   Hx of PE on coumadin  Diagnosed in 8//18/2019 with VQ scan in the setting of fungal infection  Put on coumadin since  INR has been stable but found to have INR 3.40 on admission  There is no mentioning in previous charts regarding how long the coumadin will need  It is almost 3 months now for treatment, given the ICH, I would advocate to discontinue coumadin at this time  Diabetes  HgbA1c 6.1 goal < 7.0  Controlled  Glucose within range   CBG monitoring  SSI  Hypertension . Stable on cleviprex . BP goal < 160 . Will avoid home metoprolol for now given UDS positive on cocaine . Add amlodipine, and hydralazine . Taper off cleviprex as able  Long term BP goal normotensive  ESRD on HD  Noncompliance  Missed HD for a week  Nephrology on board  Will do HD today which will also help for HTN  BMP monitoring  Cocaine abuse   last cocaine use according to pt was 1 month ago  Cessation education given  UDS positive for cocaine  Will avoid metoprolol for now  Pt is willing to quit  Other Stroke Risk Factors  Advanced age  Cigarette smoker, quit 3 months ago  Migraines  Other Active Problems  Noncompliance with meds and HD  Hospital day # 3  This patient is critically ill due to [place] on coumadin, hypertensive emergency, cocaine abuse and at significant risk of neurological worsening, death form recurrent  bleeding, cerebral edema, seizure, hypertensive encephalopathy, cocaine withdraw. This patient's care requires constant monitoring of vital signs, hemodynamics, respiratory and cardiac monitoring, review of multiple databases, neurological assessment, discussion with family, other specialists and medical decision making of high complexity. I spent 40 minutes of neurocritical care time in the care of this patient.  Acute ICH secondary to combination of HTN, coumadin , and cocaine.  No significant mass effect.  BP is well controlled.  INR has been reversed with [facility].  Will likely discharge soon to follow up in stroke clinic in a month for f/u CT Brain to ensure blood resorbed.    Rogue Jury, MS, MD    To contact Stroke Continuity provider, please refer to [email or web address] After hours, contact General Neurology

## 2018-07-20 NOTE — Progress Notes (Signed)
[facility]  Subjective  - Out of ICU to floor.  Vein mapping complete yesterday.   Objective (!) 140/98 (!) 101 98.2 F (36.8 C) (Oral) 18 100%  Intake/Output Summary (Last 24 hours) at 07/20/2018 0922 Last data filed at 07/19/2018 1159 Gross per 24 hour  Intake 10 ml  Output -  Net 10 ml    NAD, resting LUE brachiocephalic fistula - no thrill appreciable Good left radial and brachial pulse  Laboratory Lab Results: Recent Labs    07/19/18 0534 07/20/18 0517  WBC 5.8 5.2  HGB 11.8* 12.3  HCT 39.7 40.4  PLT 113* 116*   BMET Recent Labs    07/19/18 0534 07/20/18 0517  NA 134* 134*  K 4.3 3.9  CL 102 99  CO2 24 27  GLUCOSE 75 93  BUN 18 10  CREATININE 4.30* 2.29*  CALCIUM 8.6* 8.9    COAG Lab Results  Component Value Date   INR 0.96 07/20/2018   INR 0.96 07/19/2018   INR 0.99 07/18/2018   No results found for: PTT  Assessment/Planning: Left brachiocephalic fistula with no thrill, anastomotic stenosis, minimal flow through fistula, and subacute thrombus throughout upper arm - do not feel salvageable.  Will plan for new placement tentatively tomorrow - left brachiobasilic.  Discussed two stage operation with patient.  Risks and benefits discussed.   Marty Heck 07/20/2018 9:22 AM --

## 2018-07-21 ENCOUNTER — Inpatient Hospital Stay (HOSPITAL_COMMUNITY): Payer: Medicare Other

## 2018-07-21 ENCOUNTER — Encounter (HOSPITAL_COMMUNITY)
Admission: EM | Disposition: A | Discharge status: Home / Self Care | Payer: Self-pay | Source: Home / Self Care | Attending: Neurology

## 2018-07-21 ENCOUNTER — Encounter (HOSPITAL_COMMUNITY): Payer: Self-pay | Admitting: *Deleted

## 2018-07-21 DIAGNOSIS — I610 Nontraumatic intracerebral hemorrhage in hemisphere, subcortical: Secondary | ICD-10-CM

## 2018-07-21 HISTORY — PX: AV FISTULA PLACEMENT: SHX1204

## 2018-07-21 LAB — GLUCOSE, CAPILLARY
GLUCOSE-CAPILLARY: 112 mg/dL — AB (ref 70–99)
GLUCOSE-CAPILLARY: 71 mg/dL (ref 70–99)
GLUCOSE-CAPILLARY: 81 mg/dL (ref 70–99)
GLUCOSE-CAPILLARY: 78 mg/dL (ref 70–99)
GLUCOSE-CAPILLARY: 159 mg/dL — AB (ref 70–99)
Glucose-Capillary: 125 mg/dL — ABNORMAL HIGH (ref 70–99)
Glucose-Capillary: 85 mg/dL (ref 70–99)
Glucose-Capillary: 69 mg/dL — ABNORMAL LOW (ref 70–99)

## 2018-07-21 LAB — BASIC METABOLIC PANEL
ANION GAP: 10 (ref 5–15)
BUN: 25 mg/dL — ABNORMAL HIGH (ref 8–23)
CO2: 25 mmol/L (ref 22–32)
Calcium: 9.1 mg/dL (ref 8.9–10.3)
Chloride: 101 mmol/L (ref 98–111)
Creatinine, Ser: 3.38 mg/dL — ABNORMAL HIGH (ref 0.44–1.00)
GFR calc Af Amer: 16 mL/min — ABNORMAL LOW (ref >60)
GFR, EST NON AFRICAN AMERICAN: 14 mL/min — AB (ref >60)
Glucose, Bld: 101 mg/dL — ABNORMAL HIGH (ref 70–99)
POTASSIUM: 4 mmol/L (ref 3.5–5.1)
SODIUM: 136 mmol/L (ref 135–145)

## 2018-07-21 LAB — PROTIME-INR
INR: 0.95
Prothrombin Time: 12.6 seconds (ref 11.4–15.2)

## 2018-07-21 LAB — CBC
HCT: 40.7 % (ref 36.0–46.0)
HEMOGLOBIN: 12.4 g/dL (ref 12.0–15.0)
MCH: 21.6 pg — ABNORMAL LOW (ref 26.0–34.0)
MCHC: 30.5 g/dL (ref 30.0–36.0)
MCV: 70.9 fL — ABNORMAL LOW (ref 80.0–100.0)
NRBC: 0 % (ref 0.0–0.2)
PLATELETS: 132 10*3/uL — AB (ref 150–400)
RBC: 5.74 MIL/uL — AB (ref 3.87–5.11)
RDW: 20.8 % — ABNORMAL HIGH (ref 11.5–15.5)
WBC: 7.1 10*3/uL (ref 4.0–10.5)

## 2018-07-21 LAB — SURGICAL PCR SCREEN
MRSA, PCR: NEGATIVE
STAPHYLOCOCCUS AUREUS: NEGATIVE

## 2018-07-21 SURGERY — ARTERIOVENOUS (AV) FISTULA CREATION
Anesthesia: General | Site: Arm Upper | Laterality: Left

## 2018-07-21 MED ORDER — HEPARIN SODIUM (PORCINE) 1000 UNIT/ML IJ SOLN
INTRAMUSCULAR | Status: DC | PRN
  Administered 2018-07-21: 2000 [IU] via INTRAVENOUS

## 2018-07-21 MED ORDER — FENTANYL CITRATE (PF) 250 MCG/5ML IJ SOLN
INTRAMUSCULAR | Status: DC | PRN
  Administered 2018-07-21: 50 ug via INTRAVENOUS
  Administered 2018-07-21: 25 ug via INTRAVENOUS

## 2018-07-21 MED ORDER — PROPOFOL 10 MG/ML IV BOLUS
INTRAVENOUS | Status: DC | PRN
  Administered 2018-07-21: 100 mg via INTRAVENOUS

## 2018-07-21 MED ORDER — CHLORHEXIDINE GLUCONATE CLOTH 2 % EX PADS
6.0000 | MEDICATED_PAD | Freq: Every day | CUTANEOUS | Status: DC
  Administered 2018-07-23: 6 via TOPICAL

## 2018-07-21 MED ORDER — PROTAMINE SULFATE 10 MG/ML IV SOLN
INTRAVENOUS | Status: DC | PRN
  Administered 2018-07-21: 10 mg via INTRAVENOUS

## 2018-07-21 MED ORDER — CEFAZOLIN SODIUM-DEXTROSE 2-3 GM-%(50ML) IV SOLR
INTRAVENOUS | Status: DC | PRN
  Administered 2018-07-21: 2 g via INTRAVENOUS

## 2018-07-21 MED ORDER — CEFAZOLIN SODIUM 1 G IJ SOLR
INTRAMUSCULAR | Status: AC
  Filled 2018-07-21: qty 20

## 2018-07-21 MED ORDER — PHENYLEPHRINE 40 MCG/ML (10ML) SYRINGE FOR IV PUSH (FOR BLOOD PRESSURE SUPPORT)
PREFILLED_SYRINGE | INTRAVENOUS | Status: DC | PRN
  Administered 2018-07-21 (×2): 120 ug via INTRAVENOUS
  Administered 2018-07-21 (×2): 80 ug via INTRAVENOUS

## 2018-07-21 MED ORDER — EPHEDRINE 5 MG/ML INJ
INTRAVENOUS | Status: AC
  Filled 2018-07-21: qty 10

## 2018-07-21 MED ORDER — MIDAZOLAM HCL 2 MG/2ML IJ SOLN
INTRAMUSCULAR | Status: AC
  Filled 2018-07-21: qty 2

## 2018-07-21 MED ORDER — LIDOCAINE 2% (20 MG/ML) 5 ML SYRINGE
INTRAMUSCULAR | Status: AC
  Filled 2018-07-21: qty 5

## 2018-07-21 MED ORDER — DEXAMETHASONE SODIUM PHOSPHATE 10 MG/ML IJ SOLN
INTRAMUSCULAR | Status: AC
  Filled 2018-07-21: qty 2

## 2018-07-21 MED ORDER — PHENYLEPHRINE 40 MCG/ML (10ML) SYRINGE FOR IV PUSH (FOR BLOOD PRESSURE SUPPORT)
PREFILLED_SYRINGE | INTRAVENOUS | Status: AC
  Filled 2018-07-21: qty 20

## 2018-07-21 MED ORDER — MORPHINE SULFATE (PF) 2 MG/ML IV SOLN
2.0000 mg | INTRAVENOUS | Status: AC | PRN
  Administered 2018-07-21 – 2018-07-22 (×2): 2 mg via INTRAVENOUS
  Filled 2018-07-21 (×2): qty 1

## 2018-07-21 MED ORDER — ONDANSETRON HCL 4 MG/2ML IJ SOLN
INTRAMUSCULAR | Status: DC | PRN
  Administered 2018-07-21: 4 mg via INTRAVENOUS

## 2018-07-21 MED ORDER — SODIUM CHLORIDE 0.9 % IV SOLN
INTRAVENOUS | Status: DC | PRN
  Administered 2018-07-21: 500 mL

## 2018-07-21 MED ORDER — SODIUM CHLORIDE 0.9 % IV SOLN
INTRAVENOUS | Status: AC
  Filled 2018-07-21: qty 1.2

## 2018-07-21 MED ORDER — PROPOFOL 10 MG/ML IV BOLUS
INTRAVENOUS | Status: AC
  Filled 2018-07-21: qty 20

## 2018-07-21 MED ORDER — LIDOCAINE HCL (PF) 1 % IJ SOLN
INTRAMUSCULAR | Status: AC
  Filled 2018-07-21: qty 30

## 2018-07-21 MED ORDER — SODIUM CHLORIDE 0.9 % IV SOLN
INTRAVENOUS | Status: DC
  Administered 2018-07-21: 13:00:00 via INTRAVENOUS

## 2018-07-21 MED ORDER — PROTAMINE SULFATE 10 MG/ML IV SOLN
INTRAVENOUS | Status: AC
  Filled 2018-07-21: qty 5

## 2018-07-21 MED ORDER — 0.9 % SODIUM CHLORIDE (POUR BTL) OPTIME
TOPICAL | Status: DC | PRN
  Administered 2018-07-21: 1000 mL

## 2018-07-21 MED ORDER — LIDOCAINE HCL (CARDIAC) PF 100 MG/5ML IV SOSY
PREFILLED_SYRINGE | INTRAVENOUS | Status: DC | PRN
  Administered 2018-07-21: 100 mg via INTRAVENOUS

## 2018-07-21 MED ORDER — PAPAVERINE HCL 30 MG/ML IJ SOLN
INTRAMUSCULAR | Status: AC
  Filled 2018-07-21: qty 2

## 2018-07-21 MED ORDER — FENTANYL CITRATE (PF) 250 MCG/5ML IJ SOLN
INTRAMUSCULAR | Status: AC
  Filled 2018-07-21: qty 5

## 2018-07-21 MED ORDER — MIDAZOLAM HCL 2 MG/2ML IJ SOLN
INTRAMUSCULAR | Status: DC | PRN
  Administered 2018-07-21: 2 mg via INTRAVENOUS

## 2018-07-21 SURGICAL SUPPLY — 34 items
ADH SKN CLS APL DERMABOND .7 (GAUZE/BANDAGES/DRESSINGS) ×1
AGENT HMST SPONGE THK3/8 (HEMOSTASIS)
ARMBAND PINK RESTRICT EXTREMIT (MISCELLANEOUS) ×4 IMPLANT
CANISTER SUCT 3000ML PPV (MISCELLANEOUS) ×3 IMPLANT
CLIP VESOCCLUDE MED 6/CT (CLIP) ×3 IMPLANT
CLIP VESOCCLUDE SM WIDE 6/CT (CLIP) ×3 IMPLANT
COVER PROBE W GEL 5X96 (DRAPES) ×3 IMPLANT
COVER WAND RF STERILE (DRAPES) ×1 IMPLANT
DECANTER SPIKE VIAL GLASS SM (MISCELLANEOUS) ×1 IMPLANT
DERMABOND ADVANCED (GAUZE/BANDAGES/DRESSINGS) ×2
DERMABOND ADVANCED .7 DNX12 (GAUZE/BANDAGES/DRESSINGS) ×1 IMPLANT
ELECT REM PT RETURN 9FT ADLT (ELECTROSURGICAL) ×3
ELECTRODE REM PT RTRN 9FT ADLT (ELECTROSURGICAL) ×1 IMPLANT
GLOVE BIO SURGEON STRL SZ7.5 (GLOVE) ×3 IMPLANT
GLOVE BIOGEL PI IND STRL 8 (GLOVE) ×1 IMPLANT
GLOVE BIOGEL PI INDICATOR 8 (GLOVE) ×2
GOWN STRL REUS W/ TWL LRG LVL3 (GOWN DISPOSABLE) ×2 IMPLANT
GOWN STRL REUS W/ TWL XL LVL3 (GOWN DISPOSABLE) ×2 IMPLANT
GOWN STRL REUS W/TWL LRG LVL3 (GOWN DISPOSABLE) ×6
GOWN STRL REUS W/TWL XL LVL3 (GOWN DISPOSABLE) ×6
HEMOSTAT SPONGE AVITENE ULTRA (HEMOSTASIS) IMPLANT
KIT BASIN OR (CUSTOM PROCEDURE TRAY) ×3 IMPLANT
KIT TURNOVER KIT B (KITS) ×3 IMPLANT
NS IRRIG 1000ML POUR BTL (IV SOLUTION) ×3 IMPLANT
PACK CV ACCESS (CUSTOM PROCEDURE TRAY) ×3 IMPLANT
PAD ARMBOARD 7.5X6 YLW CONV (MISCELLANEOUS) ×6 IMPLANT
SUT MNCRL AB 4-0 PS2 18 (SUTURE) ×3 IMPLANT
SUT PROLENE 6 0 BV (SUTURE) ×3 IMPLANT
SUT PROLENE 7 0 BV 1 (SUTURE) IMPLANT
SUT VIC AB 3-0 SH 27 (SUTURE) ×3
SUT VIC AB 3-0 SH 27X BRD (SUTURE) ×1 IMPLANT
TOWEL GREEN STERILE (TOWEL DISPOSABLE) ×3 IMPLANT
UNDERPAD 30X30 (UNDERPADS AND DIAPERS) ×3 IMPLANT
WATER STERILE IRR 1000ML POUR (IV SOLUTION) ×3 IMPLANT

## 2018-07-21 NOTE — Anesthesia Postprocedure Evaluation (Signed)
Anesthesia Post Note  Patient: Rhonda Lynch  Procedure(s) Performed: ARTERIOVENOUS (AV) FISTULA CREATION (Left Arm Upper)     Patient location during evaluation: PACU Anesthesia Type: General Level of consciousness: sedated and patient cooperative Pain management: pain level controlled Vital Signs Assessment: post-procedure vital signs reviewed and stable Respiratory status: spontaneous breathing Cardiovascular status: stable Anesthetic complications: no    Last Vitals:  Vitals:   07/21/18 1559 07/21/18 1614  BP: (!) 141/86 (!) 145/92  Pulse: 92 98  Resp: 13 15  Temp:    SpO2: 100% 100%    Last Pain:  Vitals:   07/21/18 1614  TempSrc:   PainSc: Yoe

## 2018-07-21 NOTE — Progress Notes (Signed)
RN verified the presence of a signed informed consent that matches stated procedure by patient. Verified armband matches patient's stated name and birth date. Verified NPO status (0000, today) and that all jewelry, contact, glasses, dentures, and partials had been removed (if applicable).

## 2018-07-21 NOTE — Progress Notes (Signed)
STROKE TEAM PROGRESS NOTE   SUBJECTIVE (INTERVAL HISTORY) Patient was not seen during am rounds as she was gone for dialysis AV fistula surgery.  She is sleepy during pm rounds and states surgery went well and has no complaints OBJECTIVE Temp:  [98.1 F (36.7 C)-98.9 F (37.2 C)] 98.1 F (36.7 C) (11/11 1545) Pulse Rate:  [73-98] 98 (11/11 1614) Cardiac Rhythm: Normal sinus rhythm (11/11 1545) Resp:  [13-20] 15 (11/11 1614) BP: (126-187)/(81-102) 145/92 (11/11 1614) SpO2:  [93 %-100 %] 100 % (11/11 1614)  Recent Labs  Lab 07/21/18 0605 07/21/18 1215 07/21/18 1250 07/21/18 1547 07/21/18 1612  GLUCAP 112* 71 81 69* 78   Recent Labs  Lab 07/17/18 0935 07/18/18 1536 07/19/18 0534 07/20/18 0517 07/21/18 0631  NA 141 136 134* 134* 136  K 4.3 4.8 4.3 3.9 4.0  CL 110 107 102 99 101  CO2 23 25 24 27 25   GLUCOSE 103* 110* 75 93 101*  BUN 32* 33* 18 10 25*  CREATININE 3.19* 3.22* 4.30* 2.29* 3.38*  CALCIUM 9.1 8.4* 8.6* 8.9 9.1  PHOS  --  5.2*  --   --   --    Recent Labs  Lab 07/18/18 1536  ALBUMIN 2.6*   Recent Labs  Lab 07/17/18 0935 07/18/18 1536 07/19/18 0534 07/20/18 0517 07/21/18 0631  WBC 4.4 5.1 5.8 5.2 7.1  NEUTROABS 2.2  --   --   --   --   HGB 13.0 11.4* 11.8* 12.3 12.4  HCT 45.9 38.6 39.7 40.4 40.7  MCV 73.8* 73.7* 71.4* 71.0* 70.9*  PLT 124* 120* 113* 116* 132*   No results for input(s): CKTOTAL, CKMB, CKMBINDEX, TROPONINI in the last 168 hours. Recent Labs    07/19/18 0534 07/20/18 0517 07/21/18 0631  LABPROT 12.7 12.7 12.6  INR 0.96 0.96 0.95   No results for input(s): COLORURINE, LABSPEC, PHURINE, GLUCOSEU, HGBUR, BILIRUBINUR, KETONESUR, PROTEINUR, UROBILINOGEN, NITRITE, LEUKOCYTESUR in the last 72 hours.  Invalid input(s): APPERANCEUR     Component Value Date/Time   CHOL 226 (H) 07/19/2018 0534   TRIG 127 07/19/2018 0534   HDL 59 07/19/2018 0534   CHOLHDL 3.8 07/19/2018 0534   VLDL 25 07/19/2018 0534   LDLCALC 142 (H) 07/19/2018  0534   Lab Results  Component Value Date   HGBA1C 6.1 (H) 07/19/2018      Component Value Date/Time   LABOPIA POSITIVE (A) 07/17/2018 1511   COCAINSCRNUR POSITIVE (A) 07/17/2018 1511   LABBENZ NONE DETECTED 07/17/2018 1511   AMPHETMU NONE DETECTED 07/17/2018 1511   THCU NONE DETECTED 07/17/2018 1511   LABBARB NONE DETECTED 07/17/2018 1511    No results for input(s): ETH in the last 168 hours.  IMAGING  Ct Abdomen Pelvis Wo Contrast 06/21/2018 IMPRESSION:  1. No acute findings are noted in the abdomen or pelvis to account for the patient's symptoms.  2. Findings in the lung bases concerning for congestive heart failure, as above.  3. Aortic atherosclerosis.  4. Additional incidental findings, as above.     Ct Angio Head W Or Wo Contrast Ct Angio Neck W Or Wo Contrast 07/17/2018 IMPRESSION:  No significant large or medium vessel pathology. No evidence of atherosclerotic narrowing. The vessels are tortuous as might be seen with hypertension. There is nonstenotic atherosclerotic calcification in the carotid siphon regions. No evidence of abnormal arterial or venous structure in the region of the left hippocampus where there is an intraparenchymal hemorrhage measuring 8-9 mm.     Ct Head Wo Contrast  07/17/2018 IMPRESSION:  Small hemorrhage deep in the inferior horn of the left lateral ventricle. Cause for this abnormality is not identified but note is made that the patient is anticoagulated. Brain MRI and MRI for further evaluation recommended.   Ct Head Wo Contrast 06/23/2018 IMPRESSION:  1. No acute intracranial abnormality.  2. Chronic left maxillary sinusitis.     Ct Head Wo Contrast 06/21/2018 IMPRESSION:  Normal brain.     [name] Wo Contrast 07/18/2018 IMPRESSION:  1. Subcentimeter focus of hemorrhage, likely early subacute, within the tail of the left caudate nucleus, in close proximity to the left optic tract. No underlying lesion is identified. This  may be a hypertensive hemorrhage in the setting of anticoagulation.  2. Mild findings of chronic small vessel ischemia.     Dg Chest Port 1 View 06/22/2018 IMPRESSION:  Significant improved aeration in the right base.    Dg Chest Port 1 View 06/20/2018 IMPRESSION:  1. Patchy right lower lobe opacity, concerning for pneumonia given provided history of cough. This has worsened from 06/07/2018.  2. Associated small to moderate right pleural effusion.  3. Cardiomegaly with underlying mild diffuse pulmonary interstitial congestion/edema.  4. Aortic atherosclerosis.     Vas US Duplex Dialysis Access (avf, Avg) 07/20/2018 Summary:  The left brachiocephalic AVF was visualized. The anastomosis exhibits velocities >466/358 cm/s, suggestive of stenosis. The cephalic vein exhibits poor flow with evidence of subacute thrombus.      Vas [place] Upper Ext Vein Mapping (pre-op Avf) 07/20/2018 UPPER EXTREMITY VEIN MAPPING  Indications: Pre-access. Performing Technologist: Maudry Mayhew MHA, RDMS, RVT, RDCS  Examination Guidelines: A complete evaluation includes B-mode imaging, spectral Doppler, color Doppler, and power Doppler as needed of all accessible portions of each vessel. Bilateral testing is considered an integral part of a complete examination. Limited examinations for reoccurring indications may be performed as noted. +-----------------+-------------+----------+---------+ Right Cephalic   Diameter (cm)Depth (cm)Findings  +-----------------+-------------+----------+---------+ Shoulder             0.31        0.97             +-----------------+-------------+----------+---------+ Prox upper arm       0.36        0.91             +-----------------+-------------+----------+---------+ Mid upper arm        0.19        0.89   branching +-----------------+-------------+----------+---------+ Dist upper arm       0.26        0.57              +-----------------+-------------+----------+---------+ Antecubital fossa    0.32        0.15             +-----------------+-------------+----------+---------+ Prox forearm         0.28        0.53             +-----------------+-------------+----------+---------+ Mid forearm          0.19        0.11   branching +-----------------+-------------+----------+---------+ Dist forearm         0.19        0.17             +-----------------+-------------+----------+---------+ Wrist                0.16        0.23             +-----------------+-------------+----------+---------+ +-----------------+-------------+----------+--------------+  Right Basilic    Diameter (cm)Depth (cm)   Findings    +-----------------+-------------+----------+--------------+ Prox upper arm       0.52                              +-----------------+-------------+----------+--------------+ Mid upper arm        0.49                              +-----------------+-------------+----------+--------------+ Dist upper arm       0.50                              +-----------------+-------------+----------+--------------+ Antecubital fossa    0.18                              +-----------------+-------------+----------+--------------+ Prox forearm         0.16                              +-----------------+-------------+----------+--------------+ Mid forearm          0.14                              +-----------------+-------------+----------+--------------+ Distal forearm                          not visualized +-----------------+-------------+----------+--------------+ Elbow                                     branching    +-----------------+-------------+----------+--------------+ Wrist                                   not visualized +-----------------+-------------+----------+--------------+ +-----------------+-------------+----------+--------------+ Left Cephalic     Diameter (cm)Depth (cm)   Findings    +-----------------+-------------+----------+--------------+ Shoulder             0.44        1.05                  +-----------------+-------------+----------+--------------+ Prox upper arm       0.40        0.71                  +-----------------+-------------+----------+--------------+ Mid upper arm        0.40        0.62     branching    +-----------------+-------------+----------+--------------+ Dist upper arm       0.28        0.43                  +-----------------+-------------+----------+--------------+ Antecubital fossa    0.47        0.29        AVF       +-----------------+-------------+----------+--------------+ Prox forearm         0.31        1.07                  +-----------------+-------------+----------+--------------+ Mid forearm  not visualized +-----------------+-------------+----------+--------------+ Dist forearm                            not visualized +-----------------+-------------+----------+--------------+ Wrist                                   not visualized +-----------------+-------------+----------+--------------+ +-----------------+-------------+----------+---------+ Left Basilic     Diameter (cm)Depth (cm)Findings  +-----------------+-------------+----------+---------+ Prox upper arm       0.47                         +-----------------+-------------+----------+---------+ Mid upper arm        0.45                         +-----------------+-------------+----------+---------+ Dist upper arm       0.48               branching +-----------------+-------------+----------+---------+ Antecubital fossa    0.39                         +-----------------+-------------+----------+---------+ Prox forearm         0.34                         +-----------------+-------------+----------+---------+ Mid forearm          0.33                          +-----------------+-------------+----------+---------+ Distal forearm       0.31                         +-----------------+-------------+----------+---------+ Wrist                0.19                         +-----------------+-------------+----------+---------+ *See table(s) above for measurements and observations.  Diagnosing physician: Monica Martinez MD Electronically signed by Monica Martinez MD on 07/20/2018 at 10:01:29 AM.    Final     TEE  04/29/18 - Left ventricle: Systolic function was mildly to moderately   reduced. The estimated ejection fraction was in the range of 40%   to 45%. Mild diffuse hypokinesis. - Aortic valve: No evidence of vegetation. - Mitral valve: No evidence of vegetation. There was mild   regurgitation. - Left atrium: The atrium was mildly to moderately dilated.    No  evidence of thrombus in the atrial cavity or appendage. - Right atrium: The atrium was mildly to moderately dilated. No   evidence of thrombus in the atrial cavity or appendage. - Atrial septum: No defect or patent foramen ovale was identified. - Tricuspid valve: The posterior leaflet of the TV appears   thickened but no obvious mobile density. There is moderate   eccentric TR directed towards the interatrial septum. Suspicious   for possible TV vegetation. - Pulmonic valve: No evidence of vegetation.    PHYSICAL EXAM  Temp:  [98.1 F (36.7 C)-98.9 F (37.2 C)] 98.1 F (36.7 C) (11/11 1545) Pulse Rate:  [73-98] 98 (11/11 1614) Resp:  [13-20] 15 (11/11 1614) BP: (126-187)/(81-102) 145/92 (11/11 1614) SpO2:  [93 %-100 %] 100 % (11/11 1614)  Neurological Exam ;  Sleepy but arouses easily. Normal speech and language.eye movements full without nystagmus.fundi were not visualized. Vision acuity and fields appear normal. Hearing is normal. Palatal movements are normal. Face symmetric. Tongue midline. Normal strength, tone, reflexes and coordination. Normal sensation. Gait  deferred.    ASSESSMENT/PLAN Rhonda Lynch is a 62 y.o. 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 No tPA given due to [place].    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  CTA head and neck - unremarkable  TEE - 04/2018 - EF 40-45% Suspicious for possible TV vegetation  LDL - 226  HgbA1c - 6.1  INR 3.4->0.99  SCDs for VTE prophylaxis  warfarin daily prior to admission, now on No antithrombotic. Coumadin was reversed with Kcentra  Ongoing aggressive stroke risk factor management  Therapy recommendations:  Pending   Disposition:  Pending   Hx of PE on coumadin  Diagnosed in 8//18/2019 with VQ scan in the setting of fungal infection  Put on coumadin since  INR has been stable but found to have INR 3.40 on admission  There is no mentioning in previous charts regarding how long the coumadin will need  It is almost 3 months now for treatment, given the ICH, I would advocate to discontinue coumadin at this time  Diabetes  HgbA1c 6.1 goal < 7.0  Controlled  Glucose within range   CBG monitoring  SSI  Hypertension . Stable on cleviprex . BP goal < 160 . Will avoid home metoprolol for now given UDS positive on cocaine . Add amlodipine, and hydralazine . Taper off cleviprex as able  Long term BP goal normotensive  ESRD on HD  Noncompliance  Missed HD for a week  Nephrology on board  Will do HD today which will also help for HTN  BMP monitoring  Cocaine abuse   last cocaine use according to pt was 1 month ago  Cessation education given  UDS positive for cocaine  Will avoid metoprolol for now  Pt is willing to quit  Other Stroke Risk Factors  Advanced  age  Cigarette smoker, quit 3 months ago  Migraines  Other Active Problems  Noncompliance with meds and HD  Hospital day # 4     Acute ICH secondary to combination of HTN, coumadin , and cocaine.  No significant mass effect.  BP is well controlled.  INR has been reversed with [facility].  Will likely discharge in am  to follow up in stroke clinic in a month for f/u CT Brain to ensure blood resorbed.    Antony Contras, MD    To contact Stroke Continuity provider, please refer to [email or web address] After hours, contact General Neurology

## 2018-07-21 NOTE — Progress Notes (Signed)
[facility]  Subjective  - No acute events.   Objective 140/89 81 98.8 F (37.1 C) (Oral) 18 98% No intake or output data in the 24 hours ending 07/21/18 1351  NAD, resting LUE brachiocephalic fistula - no thrill appreciable Good left radial and brachial pulse  Laboratory Lab Results: Recent Labs    07/20/18 0517 07/21/18 0631  WBC 5.2 7.1  HGB 12.3 12.4  HCT 40.4 40.7  PLT 116* 132*   BMET Recent Labs    07/20/18 0517 07/21/18 0631  NA 134* 136  K 3.9 4.0  CL 99 101  CO2 27 25  GLUCOSE 93 101*  BUN 10 25*  CREATININE 2.29* 3.38*  CALCIUM 8.9 9.1    COAG Lab Results  Component Value Date   INR 0.95 07/21/2018   INR 0.96 07/20/2018   INR 0.96 07/19/2018   No results found for: PTT  Assessment/Planning: Left brachiocephalic fistula with no thrill, anastomotic stenosis, minimal flow through fistula, and subacute thrombus throughout upper arm - do not feel salvageable.  Will plan for new placement tentatively today - likely left brachiobasilic.  Discussed two stage operation with patient.  Risks and benefits discussed.   Marty Heck 07/21/2018 1:51 PM --

## 2018-07-21 NOTE — Progress Notes (Signed)
Patient refusing to comply with safety measures...educated patient on policy and procedure and pt still refusing to comply.  Fall mats are on the floor but pt is refusing to get in bed and is sitting on side of bed.  We are unable to set bed alarm and pt is refusing that as well.

## 2018-07-21 NOTE — Op Note (Signed)
OPERATIVE NOTE   PROCEDURE: 1. left first stage basilic vein transposition (brachiobasilic arteriovenous fistula) placement  PRE-OPERATIVE DIAGNOSIS: ESRD  POST-OPERATIVE DIAGNOSIS: same  SURGEON: Marty Heck, MD  ASSISTANT(S): Arlee Muslim, PA  ANESTHESIA: LMA  ESTIMATED BLOOD LOSS: Minimal  FINDING(S): 1.  Basilic vein: 3 mm, acceptable 2.  Brachial artery: 3.5 mm, atherosclerotic disease evident 3.  Venous outflow: palpable thrill  4.  Radial flow: palpable radial pulse  SPECIMEN(S):  none  INDICATIONS:   Rhonda Lynch is a 62 y.o. female who presents with failed left brachiocephalic arteriovenous fistula who presents for new fistula access.  After vein mapping we have recommended a basilic vein based fistula in her left arm.  The patient is scheduled for left first stage basilic vein transposition.  The patient is aware the risks include but are not limited to: bleeding, infection, steal syndrome, nerve damage, ischemic monomelic neuropathy, failure to mature, and need for additional procedures.  The patient is aware of the risks of the procedure and elects to proceed forward.  DESCRIPTION: After full informed written consent was obtained from the patient, the patient was brought back to the operating room and placed supine upon the operating table.  Prior to induction, the patient received IV antibiotics.   After obtaining adequate anesthesia, the patient was then prepped and draped in the standard fashion for a left arm access procedure.  I turned my attention first to identifying the patient's basilic vein and brachial artery.    Using SonoSite guidance, the location of these vessels were marked out on the skin.   I made a transverse incision at the level of the antecubitum and dissected through the subcutaneous tissue and fascia to gain exposure of the brachial artery.  This was noted to be 3.5 mm in diameter externally.  This was dissected out proximally and  distally and controlled with vessel loops .  I then dissected out the basilic vein.  This was noted to be 3.0 mm in diameter externally.  The distal segment of the vein was ligated with a  2-0 silk, and the vein was transected.  The proximal segment was interrogated with serial dilators.  The vein accepted up to a 3.5 mm dilator without any difficulty.  I then instilled the heparinized saline into the vein and clamped it.  At this point, I reset my exposure of the brachial artery and placed the artery under tension proximally and distally.  I made an arteriotomy with a #11 blade, and then I extended the arteriotomy with a Potts scissor.  I injected heparinized saline proximal and distal to this arteriotomy.  The vein was then sewn to the artery in an end-to-side configuration with a running stitch of 6-0 Prolene.  Prior to completing this anastomosis, I allowed the vein and artery to backbleed.  There was no evidence of clot from any vessels.  I completed the anastomosis in the usual fashion and then released all vessel loops and clamps.    There was a palpable thrill in the venous outflow, and there was a palpable radial pulse.  At this point, I irrigated out the surgical wound.  There was no further active bleeding.  The subcutaneous tissue was reapproximated with a running stitch of 3-0 Vicryl.  The skin was then reapproximated with a running subcuticular stitch of 4-0 Vicryl.  The skin was then cleaned, dried, and reinforced with Dermabond.  The patient tolerated this procedure well.   COMPLICATIONS: None  CONDITION: Stable  Marty Heck MD [facility] [place] Office: [phone] Pager: [phone]  07/21/2018, 3:20 PM

## 2018-07-21 NOTE — Progress Notes (Signed)
Pt back to the unit from pacu; pt sleepy but responds to voice. VSS; telemetry reapplied to verified with CCMD: LUE fistula site with dermabond clean, dry and intact with no bleeding or active drainage noted. Positive for bruit and thrill; bed alarm on; call light within reach. Will continue to closely monitor. Delia Heady RN   07/21/18 1614  Vitals  BP (!) 145/92  MAP (mmHg) 109  Pulse Rate 98  ECG Heart Rate 95  Resp 15  Oxygen Therapy  SpO2 100 %  Pain Assessment  Pain Scale 0-10  Pain Score Asleep

## 2018-07-21 NOTE — Progress Notes (Signed)
Pt going to OR for procedure; report called off to short stay; pt telemetry off and pt transported off unit to OR via bed. Delia Heady RN

## 2018-07-21 NOTE — Anesthesia Preprocedure Evaluation (Addendum)
Anesthesia Evaluation  Patient identified by MRN, date of birth, ID band Patient awake    Reviewed: Allergy & Precautions, NPO status , Patient's Chart, lab work & pertinent test results, reviewed documented beta blocker date and time   Airway Mallampati: II  TM Distance: >3 FB Neck ROM: Full    Dental  (+) Poor Dentition, Chipped, Missing, Dental Advisory Given,    Pulmonary shortness of breath, pneumonia, former smoker,    breath sounds clear to auscultation- rhonchi + decreased breath sounds      Cardiovascular hypertension, Pt. on medications and Pt. on home beta blockers  Rhythm:Regular Rate:Normal     Neuro/Psych PSYCHIATRIC DISORDERS negative neurological ROS     GI/Hepatic negative GI ROS, Neg liver ROS,   Endo/Other  negative endocrine ROSdiabetes, Type 2  Renal/GU Renal disease     Musculoskeletal negative musculoskeletal ROS (+)   Abdominal   Peds  Hematology  (+) anemia ,   Anesthesia Other Findings   Reproductive/Obstetrics negative OB ROS                            Anesthesia Physical Anesthesia Plan  ASA: III  Anesthesia Plan: General   Post-op Pain Management:    Induction: Intravenous  PONV Risk Score and Plan: 3 and Ondansetron and Dexamethasone  Airway Management Planned: LMA  Additional Equipment:   Intra-op Plan:   Post-operative Plan: Extubation in OR  Informed Consent: I have reviewed the patients History and Physical, chart, labs and discussed the procedure including the risks, benefits and alternatives for the proposed anesthesia with the patient or authorized representative who has indicated his/her understanding and acceptance.   Dental advisory given  Plan Discussed with: CRNA  Anesthesia Plan Comments:         Anesthesia Quick Evaluation

## 2018-07-21 NOTE — Transfer of Care (Signed)
Immediate Anesthesia Transfer of Care Note  Patient: Rhonda Lynch  Procedure(s) Performed: ARTERIOVENOUS (AV) FISTULA CREATION (Left Arm Upper)  Patient Location: PACU  Anesthesia Type:General  Level of Consciousness: lethargic and responds to stimulation  Airway & Oxygen Therapy: Patient Spontanous Breathing  Post-op Assessment: Report given to RN  Post vital signs: Reviewed and stable  Last Vitals:  Vitals Value Taken Time  BP 139/85 07/21/2018  3:44 PM  Temp    Pulse 92 07/21/2018  3:46 PM  Resp 14 07/21/2018  3:46 PM  SpO2 100 % 07/21/2018  3:46 PM  Vitals shown include unvalidated device data.  Last Pain:  Vitals:   07/21/18 1153  TempSrc: Oral  PainSc:       Patients Stated Pain Goal: 2 (15/72/62 0355)  Complications: No apparent anesthesia complications

## 2018-07-21 NOTE — Discharge Instructions (Signed)
° °  [facility] [place] ° °Discharge Instructions ° °AV Fistula or Graft Surgery for Dialysis Access ° °Please refer to the following instructions for your post-procedure care. Your surgeon or physician assistant will discuss any changes with you. ° °Activity ° °You may drive the day following your surgery, if you are comfortable and no longer taking prescription pain medication. Resume full activity as the soreness in your incision resolves. ° °Bathing/Showering ° °You may shower after you go home. Keep your incision dry for 48 hours. Do not soak in a bathtub, hot tub, or swim until the incision heals completely. You may not shower if you have a hemodialysis catheter. ° °Incision Care ° °Clean your incision with mild soap and water after 48 hours. Pat the area dry with a clean towel. You do not need a bandage unless otherwise instructed. Do not apply any ointments or creams to your incision. You may have skin glue on your incision. Do not peel it off. It will come off on its own in about one week. Your arm may swell a bit after surgery. To reduce swelling use pillows to elevate your arm so it is above your heart. Your doctor will tell you if you need to lightly wrap your arm with an ACE bandage. ° °Diet ° °Resume your normal diet. There are not special food restrictions following this procedure. In order to heal from your surgery, it is CRITICAL to get adequate nutrition. Your body requires vitamins, minerals, and protein. Vegetables are the best source of vitamins and minerals. Vegetables also provide the perfect balance of protein. Processed food has little nutritional value, so try to avoid this. ° °Medications ° °Resume taking all of your medications. If your incision is causing pain, you may take over-the counter pain relievers such as acetaminophen (Tylenol). If you were prescribed a stronger pain medication, please be aware these medications can cause nausea and constipation. Prevent  nausea by taking the medication with a snack or meal. Avoid constipation by drinking plenty of fluids and eating foods with high amount of fiber, such as fruits, vegetables, and grains. Do not take Tylenol if you are taking prescription pain medications. ° ° ° ° °Follow up °Your surgeon may want to see you in the office following your access surgery. If so, this will be arranged at the time of your surgery. ° °Please call us immediately for any of the following conditions: ° °Increased pain, redness, drainage (pus) from your incision site °Fever of 101 degrees or higher °Severe or worsening pain at your incision site °Hand pain or numbness. ° °Reduce your risk of vascular disease: ° °Stop smoking. If you would like help, call [facility] at 1-800-QUIT-NOW (1-800-784-8669) or [place] at [phone] ° °Manage your cholesterol °Maintain a desired weight °Control your diabetes °Keep your blood pressure down ° °Dialysis ° °It will take several weeks to several months for your new dialysis access to be ready for use. Your surgeon will determine when it is OK to use it. Your nephrologist will continue to direct your dialysis. You can continue to use your Permcath until your new access is ready for use. ° °If you have any questions, please call the office at [phone]. ° °

## 2018-07-21 NOTE — Progress Notes (Signed)
Pt bracelet picked up from short stay, labeled and placed in pt's room. Delia Heady RN

## 2018-07-21 NOTE — Anesthesia Procedure Notes (Signed)
Procedure Name: LMA Insertion Date/Time: 07/21/2018 2:08 PM Performed by: Barrington Ellison, CRNA Pre-anesthesia Checklist: Patient identified, Emergency Drugs available, Suction available and Patient being monitored Patient Re-evaluated:Patient Re-evaluated prior to induction Oxygen Delivery Method: Circle System Utilized Preoxygenation: Pre-oxygenation with 100% oxygen Induction Type: IV induction Ventilation: Mask ventilation without difficulty LMA: LMA inserted LMA Size: 4.0 Number of attempts: 1 Placement Confirmation: positive ETCO2 Tube secured with: Tape Dental Injury: Teeth and Oropharynx as per pre-operative assessment

## 2018-07-21 NOTE — Progress Notes (Signed)
[facility] Progress Note   Subjective:  No new complaints, breathing well, no n/v/d.  HA's a little better.   Objective Vitals:   07/21/18 0006 07/21/18 0346 07/21/18 0410 07/21/18 0911  BP: (!) 155/102 (!) 187/101 (!) 155/94 (!) 132/95  Pulse: 88 73  76  Resp: 20 20  18   Temp: 98.2 F (36.8 C) 98.1 F (36.7 C)  98.7 F (37.1 C)  TempSrc: Oral Oral  Oral  SpO2: 93% 100%  100%  Weight:      Height:       Physical Exam General: seen in room, no distress Heart: RRR Lungs: normal WOB Abdomen: soft Extremities: no edema Dialysis Access: RIJ [facility] c/d/i  Additional Objective Labs: Basic Metabolic Panel: Recent Labs  Lab 07/18/18 1536 07/19/18 0534 07/20/18 0517 07/21/18 0631  NA 136 134* 134* 136  K 4.8 4.3 3.9 4.0  CL 107 102 99 101  CO2 25 24 27 25   GLUCOSE 110* 75 93 101*  BUN 33* 18 10 25*  CREATININE 3.22* 4.30* 2.29* 3.38*  CALCIUM 8.4* 8.6* 8.9 9.1  PHOS 5.2*  --   --   --    Liver Function Tests: Recent Labs  Lab 07/18/18 1536  ALBUMIN 2.6*   No results for input(s): LIPASE, AMYLASE in the last 168 hours. CBC: Recent Labs  Lab 07/17/18 0935 07/18/18 1536 07/19/18 0534 07/20/18 0517 07/21/18 0631  WBC 4.4 5.1 5.8 5.2 7.1  NEUTROABS 2.2  --   --   --   --   HGB 13.0 11.4* 11.8* 12.3 12.4  HCT 45.9 38.6 39.7 40.4 40.7  MCV 73.8* 73.7* 71.4* 71.0* 70.9*  PLT 124* 120* 113* 116* 132*   Blood Culture    Component Value Date/Time   SDES BLOOD RIGHT HAND 06/21/2018 0027   SPECREQUEST  06/21/2018 0027    BOTTLES DRAWN AEROBIC ONLY Blood Culture adequate volume   CULT  06/21/2018 0027    NO GROWTH 5 DAYS Performed at [facility], [place] [address], [place], [place] [address]    REPTSTATUS 06/26/2018 FINAL 06/21/2018 0027    Cardiac Enzymes: No results for input(s): CKTOTAL, CKMB, CKMBINDEX, TROPONINI in the last 168 hours. CBG: Recent Labs  Lab 07/20/18 0612 07/20/18 1139 07/20/18 1556 07/20/18 2130 07/21/18 0605   GLUCAP 93 149* 106* 106* 112*   Iron Studies: No results for input(s): IRON, TIBC, TRANSFERRIN, FERRITIN in the last 72 hours. @lablastinr3 @ Studies/Results: Ct Head Wo Contrast  Result Date: 07/20/2018 CLINICAL DATA:  Follow-up examination for acute intracranial hemorrhage. EXAM: CT HEAD WITHOUT CONTRAST TECHNIQUE: Contiguous axial images were obtained from the base of the skull through the vertex without intravenous contrast. COMPARISON:  Prior CT from 07/17/2018. FINDINGS: Brain: Previously identified acute intraparenchymal hemorrhage positioned adjacent to the temporal horn of the left lateral ventricle use not significantly changed in size in appearance measuring 7 x 7 x 10 mm. No significant regional mass effect or edema. No other acute intracranial hemorrhage. No acute large vessel territory infarct. No mass lesion, midline shift or mass effect. No hydrocephalus. No extra-axial fluid collection. Vascular: No hyperdense vessel. Scattered vascular calcifications noted within the carotid siphons. Skull: Scalp soft tissues and calvarium within normal limits. Sinuses/Orbits: Globes and orbital soft tissues demonstrate no acute finding. Chronic left maxillary sinusitis with scattered mucosal thickening within the ethmoidal air cells, similar to previous. Mastoid air cells remain clear. Other: None. IMPRESSION: 1. No significant interval change in size and appearance of 10 mm mesial  left temporal lobe hemorrhage. No significant regional mass effect or edema. 2. No other new acute intracranial abnormality. Electronically Signed   By: Jeannine Boga M.D.   On: 07/20/2018 23:46   Medications:  . amLODipine  10 mg Oral Daily  . calcitRIOL  0.25 mcg Oral Q T,Th,Sa-HD  . Chlorhexidine Gluconate Cloth  6 each Topical Daily  . fluticasone  2 spray Each Nare Daily  . hydrALAZINE  25 mg Oral Q8H  . insulin aspart  0-9 Units Subcutaneous TID WC  . multivitamin  1 tablet Oral QHS  . pantoprazole  40  mg Oral Daily  . senna-docusate  1 tablet Oral BID  . sodium chloride flush  10-40 mL Intracatheter Q12H    [facility]   59kg  2/2.5 bath  P2  Hep none  R IJ TDC 59 kg 2.0 K /2.5 Ca UFP 2 -Heparin none now (was on 2000 but stopped here due to CNS bleed) -Venofer 100 mg IV X 10 doses (has not been started yet Fe 48 Tsat 18 07/08/2018) -Calcitriol 0.25 mcg PO TIW   Assessment/Plan: 1. Headaches/ IC bleed - in setting coumadinRx, now dc'd. Doing better 2. ESRD - hx noncompliance with HD. Missed HD X 4 pta. HD tomorrow.  3. Polysubstance Abuse-UDA positive for cocaine.  4. Failed LUA AVF- failed, was placed in June per Dr. Donnetta Hutching. Never cannulated.VVS planning new access placement today.    5. ESRD - TTS HD. Next HD tomorrow.  6. Hypertension/volume  - BP's still up. Wt's up too. No BB-recent cocaine use. On hydralazine and amlodipine. May need ^UF if wt's are accurate.  Wean O2, cxr if needed.  7. Anemia  -HGB 12.3. Hold OP iron load. Follow HGB.   8. Metabolic bone disease - Ca corr 9.3. PHos 5.2. No binders on OP med list. Cont VDRA.  9. DM-per primary   Kelly Splinter MD [facility] pgr [phone]   07/21/2018, 10:50 AM

## 2018-07-22 ENCOUNTER — Encounter (HOSPITAL_COMMUNITY): Payer: Self-pay | Admitting: Vascular Surgery

## 2018-07-22 ENCOUNTER — Telehealth: Payer: Self-pay | Admitting: Vascular Surgery

## 2018-07-22 LAB — CBC
HCT: 37.3 % (ref 36.0–46.0)
Hemoglobin: 11.3 g/dL — ABNORMAL LOW (ref 12.0–15.0)
MCH: 21.5 pg — AB (ref 26.0–34.0)
MCHC: 30.3 g/dL (ref 30.0–36.0)
MCV: 71 fL — ABNORMAL LOW (ref 80.0–100.0)
PLATELETS: 139 10*3/uL — AB (ref 150–400)
RBC: 5.25 MIL/uL — ABNORMAL HIGH (ref 3.87–5.11)
RDW: 20.2 % — ABNORMAL HIGH (ref 11.5–15.5)
WBC: 5.9 10*3/uL (ref 4.0–10.5)
nRBC: 0 % (ref 0.0–0.2)

## 2018-07-22 LAB — BASIC METABOLIC PANEL
ANION GAP: 6 (ref 5–15)
BUN: 32 mg/dL — AB (ref 8–23)
CHLORIDE: 102 mmol/L (ref 98–111)
CO2: 27 mmol/L (ref 22–32)
Calcium: 8.6 mg/dL — ABNORMAL LOW (ref 8.9–10.3)
Creatinine, Ser: 3.87 mg/dL — ABNORMAL HIGH (ref 0.44–1.00)
GFR calc Af Amer: 13 mL/min — ABNORMAL LOW (ref >60)
GFR calc non Af Amer: 11 mL/min — ABNORMAL LOW (ref >60)
Glucose, Bld: 83 mg/dL (ref 70–99)
POTASSIUM: 4.1 mmol/L (ref 3.5–5.1)
SODIUM: 135 mmol/L (ref 135–145)

## 2018-07-22 LAB — GLUCOSE, CAPILLARY
GLUCOSE-CAPILLARY: 148 mg/dL — AB (ref 70–99)
Glucose-Capillary: 113 mg/dL — ABNORMAL HIGH (ref 70–99)
Glucose-Capillary: 124 mg/dL — ABNORMAL HIGH (ref 70–99)
Glucose-Capillary: 133 mg/dL — ABNORMAL HIGH (ref 70–99)

## 2018-07-22 LAB — PROTIME-INR
INR: 0.98
PROTHROMBIN TIME: 12.9 s (ref 11.4–15.2)

## 2018-07-22 MED ORDER — HEPARIN SODIUM (PORCINE) 1000 UNIT/ML IJ SOLN
INTRAMUSCULAR | Status: AC
  Filled 2018-07-22: qty 1

## 2018-07-22 MED ORDER — LIDOCAINE HCL (PF) 1 % IJ SOLN: 5.0000 mL | INTRAMUSCULAR | Status: DC | PRN

## 2018-07-22 MED ORDER — SODIUM CHLORIDE 0.9 % IV SOLN: 100.0000 mL | INTRAVENOUS | Status: DC | PRN

## 2018-07-22 MED ORDER — LIDOCAINE-PRILOCAINE 2.5-2.5 % EX CREA
1.0000 "application " | TOPICAL_CREAM | CUTANEOUS | Status: DC | PRN
  Filled 2018-07-22: qty 5

## 2018-07-22 MED ORDER — HEPARIN SODIUM (PORCINE) 1000 UNIT/ML DIALYSIS
1000.0000 [IU] | INTRAMUSCULAR | Status: DC | PRN
  Filled 2018-07-22: qty 1

## 2018-07-22 MED ORDER — TRAMADOL HCL 50 MG PO TABS
ORAL_TABLET | ORAL | Status: AC
  Administered 2018-07-22: 50 mg via ORAL
  Filled 2018-07-22: qty 1

## 2018-07-22 MED ORDER — PENTAFLUOROPROP-TETRAFLUOROETH EX AERO: 1.0000 "application " | INHALATION_SPRAY | CUTANEOUS | Status: DC | PRN

## 2018-07-22 MED ORDER — ALTEPLASE 2 MG IJ SOLR
2.0000 mg | Freq: Once | INTRAMUSCULAR | Status: DC | PRN
  Filled 2018-07-22: qty 2

## 2018-07-22 MED ORDER — CALCITRIOL 0.25 MCG PO CAPS
ORAL_CAPSULE | ORAL | Status: AC
  Filled 2018-07-22: qty 1

## 2018-07-22 NOTE — Progress Notes (Signed)
[facility] Progress Note   Subjective:  No new complaints, breathing well, no n/v/d.  HA's a little better.   Objective Vitals:   07/22/18 1000 07/22/18 1020 07/22/18 1112 07/22/18 1251  BP: 96/65 (!) 114/52 (!) 89/50 102/75  Pulse: (!) 105 (!) 106 100 85  Resp:  14 18   Temp:  98 F (36.7 C) 98.7 F (37.1 C)   TempSrc:  Oral Oral   SpO2:  99% 100%   Weight:  60.1 kg    Height:       Physical Exam General: seen in room, no distress Heart: RRR Lungs: normal WOB Abdomen: soft Extremities: no edema Dialysis Access: RIJ [facility] c/d/i  Additional Objective Labs: Basic Metabolic Panel: Recent Labs  Lab 07/18/18 1536  07/20/18 0517 07/21/18 0631 07/22/18 0440  NA 136   < > 134* 136 135  K 4.8   < > 3.9 4.0 4.1  CL 107   < > 99 101 102  CO2 25   < > 27 25 27   GLUCOSE 110*   < > 93 101* 83  BUN 33*   < > 10 25* 32*  CREATININE 3.22*   < > 2.29* 3.38* 3.87*  CALCIUM 8.4*   < > 8.9 9.1 8.6*  PHOS 5.2*  --   --   --   --    < > = values in this interval not displayed.   Liver Function Tests: Recent Labs  Lab 07/18/18 1536  ALBUMIN 2.6*   No results for input(s): LIPASE, AMYLASE in the last 168 hours. CBC: Recent Labs  Lab 07/17/18 0935 07/18/18 1536 07/19/18 0534 07/20/18 0517 07/21/18 0631 07/22/18 0440  WBC 4.4 5.1 5.8 5.2 7.1 5.9  NEUTROABS 2.2  --   --   --   --   --   HGB 13.0 11.4* 11.8* 12.3 12.4 11.3*  HCT 45.9 38.6 39.7 40.4 40.7 37.3  MCV 73.8* 73.7* 71.4* 71.0* 70.9* 71.0*  PLT 124* 120* 113* 116* 132* 139*   Blood Culture    Component Value Date/Time   SDES BLOOD RIGHT HAND 06/21/2018 0027   SPECREQUEST  06/21/2018 0027    BOTTLES DRAWN AEROBIC ONLY Blood Culture adequate volume   CULT  06/21/2018 0027    NO GROWTH 5 DAYS Performed at [facility], [place] [address] [place], [place] [address]    REPTSTATUS 06/26/2018 FINAL 06/21/2018 0027    Cardiac Enzymes: No results for input(s): CKTOTAL, CKMB, CKMBINDEX,  TROPONINI in the last 168 hours. CBG: Recent Labs  Lab 07/21/18 1547 07/21/18 1612 07/21/18 2127 07/22/18 0633 07/22/18 1127  GLUCAP 69* 78 159* 113* 148*   Iron Studies: No results for input(s): IRON, TIBC, TRANSFERRIN, FERRITIN in the last 72 hours. @lablastinr3 @ Studies/Results: Ct Head Wo Contrast  Result Date: 07/20/2018 CLINICAL DATA:  Follow-up examination for acute intracranial hemorrhage. EXAM: CT HEAD WITHOUT CONTRAST TECHNIQUE: Contiguous axial images were obtained from the base of the skull through the vertex without intravenous contrast. COMPARISON:  Prior CT from 07/17/2018. FINDINGS: Brain: Previously identified acute intraparenchymal hemorrhage positioned adjacent to the temporal horn of the left lateral ventricle use not significantly changed in size in appearance measuring 7 x 7 x 10 mm. No significant regional mass effect or edema. No other acute intracranial hemorrhage. No acute large vessel territory infarct. No mass lesion, midline shift or mass effect. No hydrocephalus. No extra-axial fluid collection. Vascular: No hyperdense vessel. Scattered vascular calcifications noted within the carotid siphons. Skull:  Scalp soft tissues and calvarium within normal limits. Sinuses/Orbits: Globes and orbital soft tissues demonstrate no acute finding. Chronic left maxillary sinusitis with scattered mucosal thickening within the ethmoidal air cells, similar to previous. Mastoid air cells remain clear. Other: None. IMPRESSION: 1. No significant interval change in size and appearance of 10 mm mesial left temporal lobe hemorrhage. No significant regional mass effect or edema. 2. No other new acute intracranial abnormality. Electronically Signed   By: Jeannine Boga M.D.   On: 07/20/2018 23:46   Medications: . sodium chloride Stopped (07/21/18 1744)   . amLODipine  10 mg Oral Daily  . calcitRIOL      . calcitRIOL  0.25 mcg Oral Q T,Th,Sa-HD  . Chlorhexidine Gluconate Cloth  6 each  Topical Daily  . Chlorhexidine Gluconate Cloth  6 each Topical Q0600  . fluticasone  2 spray Each Nare Daily  . heparin      . hydrALAZINE  25 mg Oral Q8H  . insulin aspart  0-9 Units Subcutaneous TID WC  . multivitamin  1 tablet Oral QHS  . pantoprazole  40 mg Oral Daily  . senna-docusate  1 tablet Oral BID  . sodium chloride flush  10-40 mL Intracatheter Q12H    [facility]   59kg  2/2.5 bath  P2  Hep none  R IJ TDC 59 kg 2.0 K /2.5 Ca UFP 2 -Heparin none now (was on 2000 but stopped here due to CNS bleed) -Venofer 100 mg IV X 10 doses (has not been started yet Fe 48 Tsat 18 07/08/2018) -Calcitriol 0.25 mcg PO TIW   Assessment/Plan: 1. Headaches/ IC bleed - in setting coumadinRx, now dc'd. Doing better 2. ESRD - hx noncompliance with HD. Missed HD X 4 pta. HD tomorrow.  3. Polysubstance Abuse-UDA positive for cocaine.  4. Failed LUA AVF- failed, was placed in June per Dr. Donnetta Hutching. Never cannulated.VVS placed new LUA 1st stage AVF yesterday w/o complications.  5. ESRD - TTS HD. HD today.  6. Hypertension/volume  - BP's down w/ 1.8 L UF on HD today. Wt's down close to dry now. No BB-recent cocaine use.  7. Anemia  -HGB 12.3. Hold OP iron load. Follow HGB.   8. Metabolic bone disease - Ca corr 9.3. PHos 5.2. No binders on OP med list. Cont VDRA.  9. DM-per primary 10. Dispo - per neurology, [facility] pgr [phone]   07/22/2018, 1:08 PM

## 2018-07-22 NOTE — Progress Notes (Signed)
  Progress Note    07/22/2018 8:00 AM 1 Day Post-Op   Subjective:  Pain and soreness to incision of L AC fossa.  Somnolent but able to participate in conversation denies symptoms of steal syndrome L hand.  Seen on hemodialysis.   Vitals:   07/21/18 2317 07/22/18 0436  BP: 114/73 107/69  Pulse: 97 81  Resp:  18  Temp: 98.7 F (37.1 C) 98.1 F (36.7 C)  SpO2: 99% 100%   Physical Exam: Lungs:  Non labored Incisions:  L AC fossa incision with fullness however no bleeding or firm hematoma; palpable thrill near incision and audible bruit throughout upper arm Extremities:  Palpable L radial pulse Abdomen:  Soft Neurologic: somnolent but able to converse this morning  CBC    Component Value Date/Time   WBC 5.9 07/22/2018 0440   RBC 5.25 (H) 07/22/2018 0440   HGB 11.3 (L) 07/22/2018 0440   HGB 8.0 (L) 03/27/2018 1107   HCT 37.3 07/22/2018 0440   HCT 25.3 (L) 03/27/2018 1107   PLT 139 (L) 07/22/2018 0440   PLT 126 (L) 03/27/2018 1107   MCV 71.0 (L) 07/22/2018 0440   MCV 83 03/27/2018 1107   MCH 21.5 (L) 07/22/2018 0440   MCHC 30.3 07/22/2018 0440   RDW 20.2 (H) 07/22/2018 0440   RDW 19.9 (H) 03/27/2018 1107   LYMPHSABS 1.2 07/17/2018 0935   LYMPHSABS 0.5 (L) 03/27/2018 1107   MONOABS 0.5 07/17/2018 0935   EOSABS 0.2 07/17/2018 0935   EOSABS 0.0 03/27/2018 1107   BASOSABS 0.2 (H) 07/17/2018 0935   BASOSABS 0.0 03/27/2018 1107    BMET    Component Value Date/Time   NA 135 07/22/2018 0440   NA 135 03/25/2018 1014   K 4.1 07/22/2018 0440   CL 102 07/22/2018 0440   CO2 27 07/22/2018 0440   GLUCOSE 83 07/22/2018 0440   BUN 32 (H) 07/22/2018 0440   BUN 96 (HH) 03/25/2018 1014   CREATININE 3.87 (H) 07/22/2018 0440   CALCIUM 8.6 (L) 07/22/2018 0440   GFRNONAA 11 (L) 07/22/2018 0440   GFRAA 13 (L) 07/22/2018 0440    INR    Component Value Date/Time   INR 0.98 07/22/2018 0440     Intake/Output Summary (Last 24 hours) at 07/22/2018 0800 Last data filed at  07/21/2018 2200 Gross per 24 hour  Intake 623.17 ml  Output 20 ml  Net 603.17 ml     Assessment/Plan:  62 y.o. female is s/p L arm 1st stage basilic vein fistula 1 Day Post-Op    - Patent fistula with palpable thrill - Some fullness to incision however not firm and no drainage noted - We will check fistula duplex in 6 weeks in office and potentially schedule 2nd stage transposition at that time   Dagoberto Ligas, PA-C [facility] [phone] 07/22/2018 8:00 AM

## 2018-07-22 NOTE — Progress Notes (Signed)
[facility] [place]  Subjective  - POD#1 s/p left 1st stage brachiobasilic fistula.  Denies any left hand numbness/weakness.    Objective 107/69 81 98.1 F (36.7 C) (Oral) 18 100%  Intake/Output Summary (Last 24 hours) at 07/22/2018 0801 Last data filed at 07/21/2018 2200 Gross per 24 hour  Intake 623.17 ml  Output 20 ml  Net 603.17 ml    Left upper arm basilic vein with appreciable thrill Palpable left radial at wrist.  Laboratory Lab Results: Recent Labs    07/21/18 0631 07/22/18 0440  WBC 7.1 5.9  HGB 12.4 11.3*  HCT 40.7 37.3  PLT 132* 139*   BMET Recent Labs    07/21/18 0631 07/22/18 0440  NA 136 135  K 4.0 4.1  CL 101 102  CO2 25 27  GLUCOSE 101* 83  BUN 25* 32*  CREATININE 3.38* 3.87*  CALCIUM 9.1 8.6*    COAG Lab Results  Component Value Date   INR 0.98 07/22/2018   INR 0.95 07/21/2018   INR 0.96 07/20/2018   No results found for: PTT  Assessment/Planning: Doing well POD#1 s/p left 1st stage brachiobasilic fistula.  Appreciable thrill.  Will arrange one month follow-up in clinic with fistula duplex.  IF vein matures ok will need 2nd stage arranged at that time.  Vascular will sign off.   Rhonda Lynch 07/22/2018 8:01 AM --

## 2018-07-22 NOTE — Care Management Important Message (Signed)
Important Message  Patient Details  Name: Rhonda Lynch MRN: [id number] Date of Birth: [birth year]   Medicare Important Message Given:  Yes    Orbie Pyo 07/22/2018, 9:12 AM

## 2018-07-22 NOTE — Progress Notes (Signed)
STROKE TEAM PROGRESS NOTE   SUBJECTIVE (INTERVAL HISTORY) Patient was   seen during    dialysis .She is screening pain due to cramps. I spoke to nephrology PA is going to make sense fluid changes in her dialysis regimen to help OBJECTIVE Temp:  [98 F (36.7 C)-98.7 F (37.1 C)] 98.7 F (37.1 C) (11/12 1112) Pulse Rate:  [73-106] 85 (11/12 1251) Cardiac Rhythm: Sinus tachycardia (11/12 1231) Resp:  [13-20] 18 (11/12 1112) BP: (89-145)/(50-92) 102/75 (11/12 1251) SpO2:  [95 %-100 %] 100 % (11/12 1112) Weight:  [60.1 kg-62.5 kg] 60.1 kg (11/12 1020)  Recent Labs  Lab 07/21/18 1547 07/21/18 1612 07/21/18 2127 07/22/18 0633 07/22/18 1127  GLUCAP 69* 78 159* 113* 148*   Recent Labs  Lab 07/18/18 1536 07/19/18 0534 07/20/18 0517 07/21/18 0631 07/22/18 0440  NA 136 134* 134* 136 135  K 4.8 4.3 3.9 4.0 4.1  CL 107 102 99 101 102  CO2 25 24 27 25 27   GLUCOSE 110* 75 93 101* 83  BUN 33* 18 10 25* 32*  CREATININE 3.22* 4.30* 2.29* 3.38* 3.87*  CALCIUM 8.4* 8.6* 8.9 9.1 8.6*  PHOS 5.2*  --   --   --   --    Recent Labs  Lab 07/18/18 1536  ALBUMIN 2.6*   Recent Labs  Lab 07/17/18 0935 07/18/18 1536 07/19/18 0534 07/20/18 0517 07/21/18 0631 07/22/18 0440  WBC 4.4 5.1 5.8 5.2 7.1 5.9  NEUTROABS 2.2  --   --   --   --   --   HGB 13.0 11.4* 11.8* 12.3 12.4 11.3*  HCT 45.9 38.6 39.7 40.4 40.7 37.3  MCV 73.8* 73.7* 71.4* 71.0* 70.9* 71.0*  PLT 124* 120* 113* 116* 132* 139*   No results for input(s): CKTOTAL, CKMB, CKMBINDEX, TROPONINI in the last 168 hours. Recent Labs    07/20/18 0517 07/21/18 0631 07/22/18 0440  LABPROT 12.7 12.6 12.9  INR 0.96 0.95 0.98   No results for input(s): COLORURINE, LABSPEC, PHURINE, GLUCOSEU, HGBUR, BILIRUBINUR, KETONESUR, PROTEINUR, UROBILINOGEN, NITRITE, LEUKOCYTESUR in the last 72 hours.  Invalid input(s): APPERANCEUR     Component Value Date/Time   CHOL 226 (H) 07/19/2018 0534   TRIG 127 07/19/2018 0534   HDL 59 07/19/2018  0534   CHOLHDL 3.8 07/19/2018 0534   VLDL 25 07/19/2018 0534   LDLCALC 142 (H) 07/19/2018 0534   Lab Results  Component Value Date   HGBA1C 6.1 (H) 07/19/2018      Component Value Date/Time   LABOPIA POSITIVE (A) 07/17/2018 1511   COCAINSCRNUR POSITIVE (A) 07/17/2018 1511   LABBENZ NONE DETECTED 07/17/2018 1511   AMPHETMU NONE DETECTED 07/17/2018 1511   THCU NONE DETECTED 07/17/2018 1511   LABBARB NONE DETECTED 07/17/2018 1511    No results for input(s): ETH in the last 168 hours.  IMAGING  Ct Abdomen Pelvis Wo Contrast 06/21/2018 IMPRESSION:  1. No acute findings are noted in the abdomen or pelvis to account for the patient's symptoms.  2. Findings in the lung bases concerning for congestive heart failure, as above.  3. Aortic atherosclerosis.  4. Additional incidental findings, as above.     Ct Angio Head W Or Wo Contrast Ct Angio Neck W Or Wo Contrast 07/17/2018 IMPRESSION:  No significant large or medium vessel pathology. No evidence of atherosclerotic narrowing. The vessels are tortuous as might be seen with hypertension. There is nonstenotic atherosclerotic calcification in the carotid siphon regions. No evidence of abnormal arterial or venous structure in the region  of the left hippocampus where there is an intraparenchymal hemorrhage measuring 8-9 mm.     Ct Head Wo Contrast 07/17/2018 IMPRESSION:  Small hemorrhage deep in the inferior horn of the left lateral ventricle. Cause for this abnormality is not identified but note is made that the patient is anticoagulated. Brain MRI and MRI for further evaluation recommended.   Ct Head Wo Contrast 06/23/2018 IMPRESSION:  1. No acute intracranial abnormality.  2. Chronic left maxillary sinusitis.     Ct Head Wo Contrast 06/21/2018 IMPRESSION:  Normal brain.     [name] Wo Contrast 07/18/2018 IMPRESSION:  1. Subcentimeter focus of hemorrhage, likely early subacute, within the tail of the left caudate  nucleus, in close proximity to the left optic tract. No underlying lesion is identified. This may be a hypertensive hemorrhage in the setting of anticoagulation.  2. Mild findings of chronic small vessel ischemia.     Dg Chest Port 1 View 06/22/2018 IMPRESSION:  Significant improved aeration in the right base.    Dg Chest Port 1 View 06/20/2018 IMPRESSION:  1. Patchy right lower lobe opacity, concerning for pneumonia given provided history of cough. This has worsened from 06/07/2018.  2. Associated small to moderate right pleural effusion.  3. Cardiomegaly with underlying mild diffuse pulmonary interstitial congestion/edema.  4. Aortic atherosclerosis.     Vas US Duplex Dialysis Access (avf, Avg) 07/20/2018 Summary:  The left brachiocephalic AVF was visualized. The anastomosis exhibits velocities >466/358 cm/s, suggestive of stenosis. The cephalic vein exhibits poor flow with evidence of subacute thrombus.      Vas [place] Upper Ext Vein Mapping (pre-op Avf) 07/20/2018 UPPER EXTREMITY VEIN MAPPING  Indications: Pre-access. Performing Technologist: Maudry Mayhew MHA, RDMS, RVT, RDCS  Examination Guidelines: A complete evaluation includes B-mode imaging, spectral Doppler, color Doppler, and power Doppler as needed of all accessible portions of each vessel. Bilateral testing is considered an integral part of a complete examination. Limited examinations for reoccurring indications may be performed as noted. +-----------------+-------------+----------+---------+ Right Cephalic   Diameter (cm)Depth (cm)Findings  +-----------------+-------------+----------+---------+ Shoulder             0.31        0.97             +-----------------+-------------+----------+---------+ Prox upper arm       0.36        0.91             +-----------------+-------------+----------+---------+ Mid upper arm        0.19        0.89   branching  +-----------------+-------------+----------+---------+ Dist upper arm       0.26        0.57             +-----------------+-------------+----------+---------+ Antecubital fossa    0.32        0.15             +-----------------+-------------+----------+---------+ Prox forearm         0.28        0.53             +-----------------+-------------+----------+---------+ Mid forearm          0.19        0.11   branching +-----------------+-------------+----------+---------+ Dist forearm         0.19        0.17             +-----------------+-------------+----------+---------+ Wrist  0.16        0.23             +-----------------+-------------+----------+---------+ +-----------------+-------------+----------+--------------+ Right Basilic    Diameter (cm)Depth (cm)   Findings    +-----------------+-------------+----------+--------------+ Prox upper arm       0.52                              +-----------------+-------------+----------+--------------+ Mid upper arm        0.49                              +-----------------+-------------+----------+--------------+ Dist upper arm       0.50                              +-----------------+-------------+----------+--------------+ Antecubital fossa    0.18                              +-----------------+-------------+----------+--------------+ Prox forearm         0.16                              +-----------------+-------------+----------+--------------+ Mid forearm          0.14                              +-----------------+-------------+----------+--------------+ Distal forearm                          not visualized +-----------------+-------------+----------+--------------+ Elbow                                     branching    +-----------------+-------------+----------+--------------+ Wrist                                   not visualized  +-----------------+-------------+----------+--------------+ +-----------------+-------------+----------+--------------+ Left Cephalic    Diameter (cm)Depth (cm)   Findings    +-----------------+-------------+----------+--------------+ Shoulder             0.44        1.05                  +-----------------+-------------+----------+--------------+ Prox upper arm       0.40        0.71                  +-----------------+-------------+----------+--------------+ Mid upper arm        0.40        0.62     branching    +-----------------+-------------+----------+--------------+ Dist upper arm       0.28        0.43                  +-----------------+-------------+----------+--------------+ Antecubital fossa    0.47        0.29        AVF       +-----------------+-------------+----------+--------------+ Prox forearm         0.31        1.07                  +-----------------+-------------+----------+--------------+  Mid forearm                             not visualized +-----------------+-------------+----------+--------------+ Dist forearm                            not visualized +-----------------+-------------+----------+--------------+ Wrist                                   not visualized +-----------------+-------------+----------+--------------+ +-----------------+-------------+----------+---------+ Left Basilic     Diameter (cm)Depth (cm)Findings  +-----------------+-------------+----------+---------+ Prox upper arm       0.47                         +-----------------+-------------+----------+---------+ Mid upper arm        0.45                         +-----------------+-------------+----------+---------+ Dist upper arm       0.48               branching +-----------------+-------------+----------+---------+ Antecubital fossa    0.39                         +-----------------+-------------+----------+---------+ Prox forearm          0.34                         +-----------------+-------------+----------+---------+ Mid forearm          0.33                         +-----------------+-------------+----------+---------+ Distal forearm       0.31                         +-----------------+-------------+----------+---------+ Wrist                0.19                         +-----------------+-------------+----------+---------+ *See table(s) above for measurements and observations.  Diagnosing physician: Monica Martinez MD Electronically signed by Monica Martinez MD on 07/20/2018 at 10:01:29 AM.    Final     TEE  04/29/18 - Left ventricle: Systolic function was mildly to moderately   reduced. The estimated ejection fraction was in the range of 40%   to 45%. Mild diffuse hypokinesis. - Aortic valve: No evidence of vegetation. - Mitral valve: No evidence of vegetation. There was mild   regurgitation. - Left atrium: The atrium was mildly to moderately dilated.    No  evidence of thrombus in the atrial cavity or appendage. - Right atrium: The atrium was mildly to moderately dilated. No   evidence of thrombus in the atrial cavity or appendage. - Atrial septum: No defect or patent foramen ovale was identified. - Tricuspid valve: The posterior leaflet of the TV appears   thickened but no obvious mobile density. There is moderate   eccentric TR directed towards the interatrial septum. Suspicious   for possible TV vegetation. - Pulmonic valve: No evidence of vegetation.    PHYSICAL EXAM  Temp:  [98 F (36.7 C)-98.7 F (37.1 C)] 98.7 F (37.1 C) (11/12 1112) Pulse  Rate:  [73-106] 85 (11/12 1251) Resp:  [13-20] 18 (11/12 1112) BP: (89-145)/(50-92) 102/75 (11/12 1251) SpO2:  [95 %-100 %] 100 % (11/12 1112) Weight:  [60.1 kg-62.5 kg] 60.1 kg (11/12 1020)  Neurological Exam ;  Awake alert. Normal speech and language.eye movements full without nystagmus.fundi were not visualized. Vision acuity and fields  appear normal. Hearing is normal. Palatal movements are normal. Face symmetric. Tongue midline. Normal strength, tone, reflexes and coordination. Normal sensation. Gait deferred.    ASSESSMENT/PLAN Ms. RAVINDER HOFLAND is a 62 y.o. 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 No tPA given due to [place].    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  CTA head and neck - unremarkable  TEE - 04/2018 - EF 40-45% Suspicious for possible TV vegetation  LDL - 226  HgbA1c - 6.1  INR 3.4->0.99  SCDs for VTE prophylaxis  warfarin daily prior to admission, now on No antithrombotic. Coumadin was reversed with Kcentra  Ongoing aggressive stroke risk factor management  Therapy recommendations:  Pending   Disposition:  Pending   Hx of PE on coumadin  Diagnosed in 8//18/2019 with VQ scan in the setting of fungal infection  Put on coumadin since  INR has been stable but found to have INR 3.40 on admission  There is no mentioning in previous charts regarding how long the coumadin will need  It is almost 3 months now for treatment, given the ICH, I would advocate to discontinue coumadin at this time  Diabetes  HgbA1c 6.1 goal < 7.0  Controlled  Glucose within range   CBG monitoring  SSI  Hypertension . Stable on cleviprex . BP goal < 160 . Will avoid home metoprolol for now given UDS positive on cocaine . Add amlodipine, and hydralazine . Taper off cleviprex as able  Long term BP goal normotensive  ESRD on HD  Noncompliance  Missed HD for a week  Nephrology on board  Will do HD today which will also help for HTN  BMP monitoring  Cocaine abuse   last cocaine use according to pt was 1 month  ago  Cessation education given  UDS positive for cocaine  Will avoid metoprolol for now  Pt is willing to quit  Other Stroke Risk Factors  Advanced age  Cigarette smoker, quit 3 months ago  Migraines  Other Active Problems  Noncompliance with meds and HD  Hospital day # 5     Acute ICH secondary to combination of HTN, coumadin , and cocaine.  No significant mass effect.  BP is well controlled.  INR has been reversed with [facility].  Will likely discharge in am  to follow up in stroke clinic in a month for f/u CT Brain to ensure blood resorbed.    Antony Contras, MD    To contact Stroke Continuity provider, please refer to [email or web address] After hours, contact General Neurology

## 2018-07-22 NOTE — Telephone Encounter (Signed)
-----   Message from Mena Goes, RN sent at 07/21/2018  3:38 PM EST ----- Regarding: 4-6 weeks see Dr. Carlis Abbott to discuss 2nd stage    ----- Message ----- From: Iline Oven Sent: 07/21/2018   3:34 PM EST To: [facility] Admin [facility], [facility] Charge [facility]   Can you schedule an appt for Dr. Carlis Abbott in 6 weeks with fistula duplex.  PO L arm 1 st stage basilic fistula. Thanks, MAtt

## 2018-07-22 NOTE — Telephone Encounter (Signed)
sch appt [facility] ltr 08/26/18 3pm Dialysis Duplex 330pm p/o MD

## 2018-07-23 LAB — CBC
HCT: 41.7 % (ref 36.0–46.0)
Hemoglobin: 12.4 g/dL (ref 12.0–15.0)
MCH: 21.3 pg — AB (ref 26.0–34.0)
MCHC: 29.7 g/dL — ABNORMAL LOW (ref 30.0–36.0)
MCV: 71.5 fL — AB (ref 80.0–100.0)
PLATELETS: 157 10*3/uL (ref 150–400)
RBC: 5.83 MIL/uL — ABNORMAL HIGH (ref 3.87–5.11)
RDW: 20.6 % — AB (ref 11.5–15.5)
WBC: 7.1 10*3/uL (ref 4.0–10.5)
nRBC: 0 % (ref 0.0–0.2)

## 2018-07-23 LAB — PROTIME-INR
INR: 0.97
PROTHROMBIN TIME: 12.8 s (ref 11.4–15.2)

## 2018-07-23 LAB — BASIC METABOLIC PANEL
Anion gap: 9 (ref 5–15)
BUN: 24 mg/dL — ABNORMAL HIGH (ref 8–23)
CALCIUM: 9.5 mg/dL (ref 8.9–10.3)
CO2: 24 mmol/L (ref 22–32)
Chloride: 99 mmol/L (ref 98–111)
Creatinine, Ser: 4.09 mg/dL — ABNORMAL HIGH (ref 0.44–1.00)
GFR calc Af Amer: 12 mL/min — ABNORMAL LOW (ref >60)
GFR, EST NON AFRICAN AMERICAN: 11 mL/min — AB (ref >60)
GLUCOSE: 97 mg/dL (ref 70–99)
Potassium: 4.3 mmol/L (ref 3.5–5.1)
Sodium: 132 mmol/L — ABNORMAL LOW (ref 135–145)

## 2018-07-23 LAB — GLUCOSE, CAPILLARY
GLUCOSE-CAPILLARY: 96 mg/dL (ref 70–99)
Glucose-Capillary: 136 mg/dL — ABNORMAL HIGH (ref 70–99)

## 2018-07-23 MED ORDER — AMLODIPINE BESYLATE 10 MG PO TABS: 10.0000 mg | ORAL_TABLET | Freq: Every day | ORAL | 2 refills | Status: DC

## 2018-07-23 MED ORDER — HYDRALAZINE HCL 25 MG PO TABS: 25.0000 mg | ORAL_TABLET | Freq: Three times a day (TID) | ORAL | 2 refills | Status: DC

## 2018-07-23 MED ORDER — INSULIN ASPART 100 UNIT/ML ~~LOC~~ SOLN: 5.0000 [IU] | Freq: Three times a day (TID) | SUBCUTANEOUS | Status: DC

## 2018-07-23 MED ORDER — CALCITRIOL 0.25 MCG PO CAPS: 0.2500 ug | ORAL_CAPSULE | ORAL | Status: DC

## 2018-07-23 NOTE — Care Management Note (Signed)
Case Management Note  Patient Details  Name: Rhonda Lynch MRN: [id number] Date of Birth: 04-04-1956  Subjective/Objective:   Pt admitted with ICH. She is from home with a friend. She states her friend can provide assistance at home.  DME: none Pt has trouble affording her medications at times. She does have Medicaid that lowers her cost to less than $4 per med. She also goes to [facility] pharmacy for her meds.  Pt denies issues with transportation.                   Action/Plan: Pt discharging home with self care. No F/u per PT/OT and no DME needs. Pt states her girlfriend can provide transport home.  Pt discharging on 2 new medications. She states she has the money for these today. Her son is going to assist with the cost of refills after the weekend.   Expected Discharge Date:  07/23/18               Expected Discharge Plan:  Home/Self Care  In-House Referral:     Discharge planning Services  CM Consult  Post Acute Care Choice:    Choice offered to:     DME Arranged:    DME Agency:     HH Arranged:    HH Agency:     Status of Service:  Completed, signed off  If discussed at [employer] of Stay Meetings, dates discussed:    Additional Comments:  Pollie Friar, RN 07/23/2018, 2:17 PM

## 2018-07-23 NOTE — Discharge Summary (Addendum)
Stroke Discharge Summary  [name]   MRN: [id number]      DOB: 10-Sep-1956  Date of Admission: 07/17/2018 Date of Discharge: 07/23/2018  Attending Physician:  Garvin Fila, MD, Stroke MD Consultant(s):   Roney Jaffe, MD (nephrology), Monica Martinez, MD (vascular surgeon) Patient's PCP:  Clent Demark, PA-C  DISCHARGE DIAGNOSIS:  Principal Problem:   ICH (intracerebral hemorrhage) ([place]) small left caudate due to HTN Active Problems:   TOBACCO ABUSE   ESRD (end stage renal disease) ([place])   Alcohol abuse   Anemia   Cocaine abuse ([place])   Hypertension   Noncompliance of patient with renal dialysis ([place])   DM (diabetes mellitus), type 2 with renal complications ([place])   Pulmonary embolism ([place])   Hemodialysis catheter infection, initial encounter [facility])   Past Medical History:  Diagnosis Date  . Chronic kidney disease   . Diabetes mellitus without complication ([place])   . ESRD (end stage renal disease) ([place])   . Hypertension   . Oxygen deficiency    2 L at night  . Substance abuse ([place])    has been clean for 4 months    Past Surgical History:  Procedure Laterality Date  . AV FISTULA PLACEMENT Left 03/05/2018   Procedure: ARTERIOVENOUS (AV) FISTULA CREATION  LEFT UPPER EXTREMITY;  Surgeon: [name], MD;  Location: [place];  Service: Vascular;  Laterality: Left;  . AV FISTULA PLACEMENT Left 07/21/2018   Procedure: ARTERIOVENOUS (AV) FISTULA CREATION;  Surgeon: Marty Heck, MD;  Location: [place];  Service: Vascular;  Laterality: Left;  . HEMATOMA EVACUATION Left 03/06/2018   Procedure: EVACUATION HEMATOMA LEFT ARM;  Surgeon: Angelia Mould, MD;  Location: [place];  Service: Vascular;  Laterality: Left;  . INSERTION OF DIALYSIS CATHETER Left 04/29/2018   Procedure: INSERTION OF DIALYSIS CATHETER LEFT INTERNAL JUGULAR;  Surgeon: Angelia Mould, MD;  Location: [place];  Service: Vascular;  Laterality: Left;  . IR FLUORO GUIDE CV  LINE RIGHT  02/18/2018  . IR FLUORO GUIDE CV LINE RIGHT  02/26/2018  . IR REMOVAL TUN CV CATH W/O FL  04/27/2018  . IR US GUIDE VASC ACCESS RIGHT  02/18/2018  . TEE WITHOUT CARDIOVERSION N/A 04/29/2018   Procedure: TRANSESOPHAGEAL ECHOCARDIOGRAM (TEE);  Surgeon: Sueanne Margarita, MD;  Location: [facility] ENDOSCOPY;  Service: Cardiovascular;  Laterality: N/A;    Allergies as of 07/23/2018      Reactions   Acetaminophen Nausea And Vomiting   Patient states Acetaminophen and Acetaminophen containing products make her nauseated. She demonstrated this 06/21/18 with nausea followed by emesis.      Medication List    STOP taking these medications   acetaminophen 325 MG tablet Commonly known as:  TYLENOL   feeding supplement (GLUCERNA SHAKE) Liqd   metoprolol tartrate 25 MG tablet Commonly known as:  LOPRESSOR   ondansetron 4 MG tablet Commonly known as:  ZOFRAN   sodium chloride 0.65 % Soln nasal spray Commonly known as:  OCEAN   warfarin 2.5 MG tablet Commonly known as:  COUMADIN     TAKE these medications   ACCU-CHEK AVIVA device Use as instructed   accu-chek soft touch lancets Use twice per day.   amLODipine 10 MG tablet Commonly known as:  NORVASC Take 1 tablet (10 mg total) by mouth daily. Start taking on:  07/24/2018   calcitRIOL 0.25 MCG capsule Commonly known as:  ROCALTROL Take 1 capsule (0.25 mcg total) by mouth Every  Tuesday,Thursday,and Saturday with dialysis. Start taking on:  07/24/2018 What changed:  when to take this   fluticasone 50 MCG/ACT nasal spray Commonly known as:  FLONASE Place 2 sprays into both nostrils daily.   glucose blood test strip Use twice per day.   hydrALAZINE 25 MG tablet Commonly known as:  APRESOLINE Take 1 tablet (25 mg total) by mouth every 8 (eight) hours.   hydrOXYzine 25 MG tablet Commonly known as:  ATARAX/VISTARIL Take 1 tablet (25 mg total) by mouth 3 (three) times daily as needed for itching.   insulin aspart 100 UNIT/ML  injection Commonly known as:  novoLOG Inject 5 Units into the skin 3 (three) times daily with meals.   insulin glargine 100 UNIT/ML injection Commonly known as:  LANTUS Inject 0.05 mLs (5 Units total) into the skin at bedtime.   INSULIN SYRINGE .3CC/31GX5/16" 31G X 5/16" 0.3 ML Misc Inject 1 each into the skin 4 (four) times daily.   multivitamin Tabs tablet Take 1 tablet by mouth at bedtime.   zolpidem 5 MG tablet Commonly known as:  AMBIEN Take 1 tablet (5 mg total) by mouth at bedtime as needed for sleep.       LABORATORY STUDIES CBC    Component Value Date/Time   WBC 7.1 07/23/2018 0413   RBC 5.83 (H) 07/23/2018 0413   HGB 12.4 07/23/2018 0413   HGB 8.0 (L) 03/27/2018 1107   HCT 41.7 07/23/2018 0413   HCT 25.3 (L) 03/27/2018 1107   PLT 157 07/23/2018 0413   PLT 126 (L) 03/27/2018 1107   MCV 71.5 (L) 07/23/2018 0413   MCV 83 03/27/2018 1107   MCH 21.3 (L) 07/23/2018 0413   MCHC 29.7 (L) 07/23/2018 0413   RDW 20.6 (H) 07/23/2018 0413   RDW 19.9 (H) 03/27/2018 1107   LYMPHSABS 1.2 07/17/2018 0935   LYMPHSABS 0.5 (L) 03/27/2018 1107   MONOABS 0.5 07/17/2018 0935   EOSABS 0.2 07/17/2018 0935   EOSABS 0.0 03/27/2018 1107   BASOSABS 0.2 (H) 07/17/2018 0935   BASOSABS 0.0 03/27/2018 1107   CMP    Component Value Date/Time   NA 132 (L) 07/23/2018 0413   NA 135 03/25/2018 1014   K 4.3 07/23/2018 0413   CL 99 07/23/2018 0413   CO2 24 07/23/2018 0413   GLUCOSE 97 07/23/2018 0413   BUN 24 (H) 07/23/2018 0413   BUN 96 (HH) 03/25/2018 1014   CREATININE 4.09 (H) 07/23/2018 0413   CALCIUM 9.5 07/23/2018 0413   PROT 6.0 (L) 06/20/2018 2042   ALBUMIN 2.6 (L) 07/18/2018 1536   AST 32 06/20/2018 2042   ALT 16 06/20/2018 2042   ALKPHOS 76 06/20/2018 2042   BILITOT 0.3 06/20/2018 2042   GFRNONAA 11 (L) 07/23/2018 0413   GFRAA 12 (L) 07/23/2018 0413   COAGS Lab Results  Component Value Date   INR 0.97 07/23/2018   INR 0.98 07/22/2018   INR 0.95 07/21/2018    Lipid Panel    Component Value Date/Time   CHOL 226 (H) 07/19/2018 0534   TRIG 127 07/19/2018 0534   HDL 59 07/19/2018 0534   CHOLHDL 3.8 07/19/2018 0534   VLDL 25 07/19/2018 0534   LDLCALC 142 (H) 07/19/2018 0534   HgbA1C  Lab Results  Component Value Date   HGBA1C 6.1 (H) 07/19/2018   Urinalysis    Component Value Date/Time   COLORURINE AMBER (A) 05/06/2018 1523   APPEARANCEUR HAZY (A) 05/06/2018 1523   LABSPEC 1.017 05/06/2018 1523   PHURINE 6.0  05/06/2018 1523   GLUCOSEU 50 (A) 05/06/2018 1523   HGBUR LARGE (A) 05/06/2018 1523   HGBUR trace-intact 03/08/2010 1452   BILIRUBINUR NEGATIVE 05/06/2018 1523   KETONESUR NEGATIVE 05/06/2018 1523   PROTEINUR >=300 (A) 05/06/2018 1523   UROBILINOGEN 0.2 03/08/2010 1452   NITRITE NEGATIVE 05/06/2018 1523   LEUKOCYTESUR MODERATE (A) 05/06/2018 1523   Urine Drug Screen     Component Value Date/Time   LABOPIA POSITIVE (A) 07/17/2018 1511   COCAINSCRNUR POSITIVE (A) 07/17/2018 1511   LABBENZ NONE DETECTED 07/17/2018 1511   AMPHETMU NONE DETECTED 07/17/2018 1511   THCU NONE DETECTED 07/17/2018 1511   LABBARB NONE DETECTED 07/17/2018 1511    Alcohol Level    Component Value Date/Time   ETH <10 06/07/2018 1948     SIGNIFICANT DIAGNOSTIC STUDIES Ct Head Wo Contrast 07/17/2018 IMPRESSION:  Small hemorrhage deep in the inferior horn of the left lateral ventricle. Cause for this abnormality is not identified but note is made that the patient is anticoagulated. Brain MRI and MRI for further evaluation recommended.   Ct Angio Head W Or Wo Contrast Ct Angio Neck W Or Wo Contrast 07/17/2018 IMPRESSION:  No significant large or medium vessel pathology. No evidence of atherosclerotic narrowing. The vessels are tortuous as might be seen with hypertension. There is nonstenotic atherosclerotic calcification in the carotid siphon regions. No evidence of abnormal arterial or venous structure in the region of the left hippocampus where  there is an intraparenchymal hemorrhage measuring 8-9 mm.   [name] Wo Contrast 07/18/2018 IMPRESSION:  1. Subcentimeter focus of hemorrhage, likely early subacute, within the tail of the left caudate nucleus, in close proximity to the left optic tract. No underlying lesion is identified. This may be a hypertensive hemorrhage in the setting of anticoagulation.  2. Mild findings of chronic small vessel ischemia.   Vas US Duplex Dialysis Access (avf, Avg) 07/20/2018 Summary:  The left brachiocephalic AVF was visualized. The anastomosis exhibits velocities >466/358 cm/s, suggestive of stenosis. The cephalic vein exhibits poor flow with evidence of subacute thrombus.     Vas [place] Upper Ext Vein Mapping (pre-op Avf) 07/20/2018 UPPER EXTREMITY VEIN MAPPING  Indications: Pre-access. Performing Technologist: Maudry Mayhew MHA, RDMS, RVT, RDCS  Examination Guidelines: A complete evaluation includes B-mode imaging, spectral Doppler, color Doppler, and power Doppler as needed of all accessible portions of each vessel. Bilateral testing is considered an integral part of a complete examination. Limited examinations for reoccurring indications may be performed as noted. +-----------------+-------------+----------+---------+ Right Cephalic   Diameter (cm)Depth (cm)Findings  +-----------------+-------------+----------+---------+ Shoulder             0.31        0.97             +-----------------+-------------+----------+---------+ Prox upper arm       0.36        0.91             +-----------------+-------------+----------+---------+ Mid upper arm        0.19        0.89   branching +-----------------+-------------+----------+---------+ Dist upper arm       0.26        0.57             +-----------------+-------------+----------+---------+ Antecubital fossa    0.32        0.15             +-----------------+-------------+----------+---------+ Prox forearm         0.28  0.53             +-----------------+-------------+----------+---------+ Mid forearm          0.19        0.11   branching +-----------------+-------------+----------+---------+ Dist forearm         0.19        0.17             +-----------------+-------------+----------+---------+ Wrist                0.16        0.23             +-----------------+-------------+----------+---------+ +-----------------+-------------+----------+--------------+ Right Basilic    Diameter (cm)Depth (cm)   Findings    +-----------------+-------------+----------+--------------+ Prox upper arm       0.52                              +-----------------+-------------+----------+--------------+ Mid upper arm        0.49                              +-----------------+-------------+----------+--------------+ Dist upper arm       0.50                              +-----------------+-------------+----------+--------------+ Antecubital fossa    0.18                              +-----------------+-------------+----------+--------------+ Prox forearm         0.16                              +-----------------+-------------+----------+--------------+ Mid forearm          0.14                              +-----------------+-------------+----------+--------------+ Distal forearm                          not visualized +-----------------+-------------+----------+--------------+ Elbow                                     branching    +-----------------+-------------+----------+--------------+ Wrist                                   not visualized +-----------------+-------------+----------+--------------+ +-----------------+-------------+----------+--------------+ Left Cephalic    Diameter (cm)Depth (cm)   Findings    +-----------------+-------------+----------+--------------+ Shoulder             0.44        1.05                   +-----------------+-------------+----------+--------------+ Prox upper arm       0.40        0.71                  +-----------------+-------------+----------+--------------+ Mid upper arm        0.40        0.62     branching    +-----------------+-------------+----------+--------------+ Dist upper arm  0.28        0.43                  +-----------------+-------------+----------+--------------+ Antecubital fossa    0.47        0.29        AVF       +-----------------+-------------+----------+--------------+ Prox forearm         0.31        1.07                  +-----------------+-------------+----------+--------------+ Mid forearm                             not visualized +-----------------+-------------+----------+--------------+ Dist forearm                            not visualized +-----------------+-------------+----------+--------------+ Wrist                                   not visualized +-----------------+-------------+----------+--------------+ +-----------------+-------------+----------+---------+ Left Basilic     Diameter (cm)Depth (cm)Findings  +-----------------+-------------+----------+---------+ Prox upper arm       0.47                         +-----------------+-------------+----------+---------+ Mid upper arm        0.45                         +-----------------+-------------+----------+---------+ Dist upper arm       0.48               branching +-----------------+-------------+----------+---------+ Antecubital fossa    0.39                         +-----------------+-------------+----------+---------+ Prox forearm         0.34                         +-----------------+-------------+----------+---------+ Mid forearm          0.33                         +-----------------+-------------+----------+---------+ Distal forearm       0.31                          +-----------------+-------------+----------+---------+ Wrist                0.19                         +-----------------+-------------+----------+---------+ *See table(s) above for measurements and observations.  Diagnosing physician: Monica Martinez MD Electronically signed by Monica Martinez MD on 07/20/2018 at 10:01:29 AM.    Final    TEE  04/29/18 - Left ventricle: Systolic function was mildly to moderatelyreduced. The estimated ejection fraction was in the range of 40%to 45%. Mild diffuse hypokinesis. - Aortic valve: No evidence of vegetation. - Mitral valve: No evidence of vegetation. There was mildregurgitation. - Left atrium: The atrium was mildly to moderately dilated.  No  evidence of thrombus in the atrial cavity or appendage. - Right atrium: The atrium was mildly to moderately dilated. Noevidence of thrombus in the atrial cavity or  appendage. - Atrial septum: No defect or patent foramen ovale was identified. - Tricuspid valve: The posterior leaflet of the TV appearsthickened but no obvious mobile density. There is moderateeccentric TR directed towards the interatrial septum. Suspiciousfor possible TV vegetation. - Pulmonic valve: No evidence of vegetation     HISTORY OF PRESENT ILLNESS Rhonda Lynch is a 62 y.o. female past medical history of diabetes, ESRD on HD, depression and suicidality, hypertension, noncompliant to medications, substance abuse with last cocaine use according to her 1 month ago, PE on Coumadin, presenting for evaluation of unremitting headaches for 3 to 4 weeks.  She said her headaches have been going on actually prior to that but past 3 to 4 weeks they have become much worse.  She also started having some blurred vision off and on in both eyes over this.  Of time.  Her symptoms of persisted and become worse from when they started, and that prompted her to come to the emergency room. A noncontrast CT of the head was performed that showed a small  bleed in the inferior horn of the left lateral ventricle. She denies any jaw claudication.  Denies any tingling or numbness or focal weakness.  Reports generalized lethargy and malaise.  Denies any fevers chills at this time.  Denies any cough shortness of breath chest pain nausea vomiting. Patient had pulmonary embolism diagnosed in August 2019 and started on Coumadin.  She was at that time very sick with fungemia, sepsis and pneumonia as well.  Her most recent discharge was 06/24/2018 from the hospital which was for acute hypoxic respiratory failure, acute on chronic diastolic CHF, ESRD-missed hemodialysis and ensuing hypertensive urgency. Premorbid modified Rankin scale (mRS): 0. ICH Score: 1.  She was admitted to the neuro ICU.  HOSPITAL COURSE Rhonda Lynch is a 63 y.o. 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  CT showed small ICH, felt to be due to hypertension.  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  CTA head and neck - unremarkable  TEE - 04/2018 - EF 40-45% Suspicious for possible TV vegetation  LDL - 226  HgbA1c - 6.1  INR 3.4->0.99  warfarin daily prior to admission, Coumadin was reversed with Kcentra.  Will start on aspirin 81 mg at time of discharge.  Therapy recommendations:   No therapy needs  Disposition:   Home  Hx of PE on coumadin  Diagnosed in 8//18/2019 with VQ scan in the setting of fungal infection  Put on coumadin since  INR has been stable but found to have INR 3.40 on admission  There is no mentioning in previous charts regarding how long the coumadin will need  Dr. Leonie Man recommends resumption of warfarin in 1 month if imaging shows resolving  hemorrhage.  Diabetes  HgbA1c 6.1 at goal < 7.0  Hypertension  Treated in the ICU with cleviprex  BP goal < 160 in hospital  Will avoid home metoprolol for now given UDS positive on cocaine  Add amlodipine, and hydralazine  BP goal normotensive at time of discharge  ESRD on HD TTS  Noncompliance  Missed HD for a week  Nephrology on board  Multiple recurrent days of hemodialysis in hospital  AVF placed 44/62/8638 without complications  Cocaine abuse   last reported cocaine use according to pt was 1 month ago  Cessation education given  UDS positive for cocaine  Will avoid metoprolol for now  Pt is willing to quit  Other Stroke Risk Factors  Advanced age  Cigarette smoker, quit 3 months ago  ETOH use  Migraines  Other Active Problems  Noncompliance with meds and HD  Anemia of chronic disease  Metabolic bone disease  DISCHARGE EXAM   Blood pressure 124/75, pulse 89, temperature 98 F (36.7 C), temperature source Oral, resp. rate 18, height 5\' 1"  (1.549 m), weight 60.1 kg, SpO2 100 %. Awake alert. Normal speech and language.eye movements full without nystagmus.fundi were not visualized. Vision acuity and fields appear normal. Hearing is normal. Palatal movements are normal. Face symmetric. Tongue midline. Normal strength, tone, reflexes and coordination. Normal sensation. Gait deferred.  Discharge Diet   renal/carb modified with 1200 cc fluid restriction and at bedtime snack thin liquids  DISCHARGE PLAN  Disposition: Home  aspirin 81 mg daily for secondary stroke prevention.  Plan resumption of Coumadin in 1 month if CT imaging shows resolving hemorrhage.  [place] staff asked to arrange.  Ongoing risk factor control by Primary Care Physician at time of discharge  Follow-up Clent Demark, PA-C in 2 weeks.  Follow-up in [facility] in 4 weeks, office to schedule an appointment.   35 minutes were spent  preparing discharge.  Burnetta Sabin, MSN, APRN, ANVP-BC, AGPCNP-BC Advanced Practice Stroke Nurse [place] for Schedule & Pager information 07/23/2018 1:32 PM  I have personally examined this patient, reviewed notes, independently viewed imaging studies, participated in medical decision making and plan of care.ROS completed by me personally and pertinent positives fully documented  I have made any additions or clarifications directly to the above note. Agree with note above.   Antony Contras, MD Medical Director [facility] Pager: [phone] 07/23/2018 4:32 PM

## 2018-07-28 ENCOUNTER — Other Ambulatory Visit: Payer: Self-pay

## 2018-07-28 DIAGNOSIS — Z992 Dependence on renal dialysis: Principal | ICD-10-CM

## 2018-07-28 DIAGNOSIS — N186 End stage renal disease: Secondary | ICD-10-CM

## 2018-07-29 ENCOUNTER — Telehealth: Payer: Self-pay

## 2018-07-29 ENCOUNTER — Telehealth: Payer: Self-pay | Admitting: Neurology

## 2018-07-29 ENCOUNTER — Other Ambulatory Visit: Payer: Self-pay | Admitting: Neurology

## 2018-07-29 DIAGNOSIS — I610 Nontraumatic intracerebral hemorrhage in hemisphere, subcortical: Secondary | ICD-10-CM

## 2018-07-29 NOTE — Telephone Encounter (Signed)
Medicare/medicaid no auth order sent to GI. They will reach out to the pt to schedule. I lvm for pt to be aware I also gave her GI phone number of [phone] and o give them a call if she has not heard in the next 2-3 business days.

## 2018-07-29 NOTE — Telephone Encounter (Signed)
Patient called with c/o of arm swollen and "achy from fingers to my arm" that has been going on the last "couple days". She said it feels tingly and feels the same whether it is hanging at her side or elevated. She stated she had not been elevating it until last night. She has dialysis today and does not want to go. I encouraged her to go to dialysis and elevate her arm. Patient abruptly hung up.

## 2018-08-11 ENCOUNTER — Other Ambulatory Visit: Payer: Self-pay

## 2018-08-11 ENCOUNTER — Emergency Department (HOSPITAL_COMMUNITY)
Admission: EM | Admit: 2018-08-11 | Discharge: 2018-08-11 | Disposition: A | Discharge status: Home / Self Care | Payer: Medicare Other | Attending: Emergency Medicine | Admitting: Emergency Medicine

## 2018-08-11 ENCOUNTER — Emergency Department (HOSPITAL_COMMUNITY): Payer: Medicare Other

## 2018-08-11 ENCOUNTER — Encounter (HOSPITAL_COMMUNITY): Payer: Self-pay

## 2018-08-11 DIAGNOSIS — Z79899 Other long term (current) drug therapy: Secondary | ICD-10-CM | POA: Diagnosis not present

## 2018-08-11 DIAGNOSIS — N186 End stage renal disease: Secondary | ICD-10-CM | POA: Insufficient documentation

## 2018-08-11 DIAGNOSIS — E1122 Type 2 diabetes mellitus with diabetic chronic kidney disease: Secondary | ICD-10-CM | POA: Insufficient documentation

## 2018-08-11 DIAGNOSIS — I10 Essential (primary) hypertension: Secondary | ICD-10-CM

## 2018-08-11 DIAGNOSIS — Z87891 Personal history of nicotine dependence: Secondary | ICD-10-CM | POA: Insufficient documentation

## 2018-08-11 DIAGNOSIS — I120 Hypertensive chronic kidney disease with stage 5 chronic kidney disease or end stage renal disease: Secondary | ICD-10-CM | POA: Insufficient documentation

## 2018-08-11 DIAGNOSIS — R51 Headache: Secondary | ICD-10-CM | POA: Insufficient documentation

## 2018-08-11 DIAGNOSIS — Z794 Long term (current) use of insulin: Secondary | ICD-10-CM | POA: Diagnosis not present

## 2018-08-11 DIAGNOSIS — Z992 Dependence on renal dialysis: Secondary | ICD-10-CM | POA: Insufficient documentation

## 2018-08-11 DIAGNOSIS — R519 Headache, unspecified: Secondary | ICD-10-CM

## 2018-08-11 HISTORY — DX: Cerebral infarction, unspecified: I63.9

## 2018-08-11 LAB — CBC WITH DIFFERENTIAL/PLATELET
ABS IMMATURE GRANULOCYTES: 0 10*3/uL (ref 0.00–0.07)
Basophils Absolute: 0 10*3/uL (ref 0.0–0.1)
Basophils Relative: 0 %
Eosinophils Absolute: 0 10*3/uL (ref 0.0–0.5)
Eosinophils Relative: 0 %
HEMATOCRIT: 38.3 % (ref 36.0–46.0)
Hemoglobin: 11.4 g/dL — ABNORMAL LOW (ref 12.0–15.0)
LYMPHS ABS: 1.8 10*3/uL (ref 0.7–4.0)
Lymphocytes Relative: 24 %
MCH: 21.1 pg — ABNORMAL LOW (ref 26.0–34.0)
MCHC: 29.8 g/dL — ABNORMAL LOW (ref 30.0–36.0)
MCV: 70.8 fL — AB (ref 80.0–100.0)
MONOS PCT: 4 %
Monocytes Absolute: 0.3 10*3/uL (ref 0.1–1.0)
NEUTROS ABS: 5.5 10*3/uL (ref 1.7–7.7)
NEUTROS PCT: 72 %
NRBC: 0 /100{WBCs}
Platelets: 152 10*3/uL (ref 150–400)
RBC: 5.41 MIL/uL — ABNORMAL HIGH (ref 3.87–5.11)
RDW: 21.8 % — AB (ref 11.5–15.5)
WBC: 7.7 10*3/uL (ref 4.0–10.5)
nRBC: 0 % (ref 0.0–0.2)

## 2018-08-11 LAB — BASIC METABOLIC PANEL
ANION GAP: 7 (ref 5–15)
BUN: 28 mg/dL — ABNORMAL HIGH (ref 8–23)
CO2: 21 mmol/L — AB (ref 22–32)
Calcium: 8.8 mg/dL — ABNORMAL LOW (ref 8.9–10.3)
Chloride: 110 mmol/L (ref 98–111)
Creatinine, Ser: 2.43 mg/dL — ABNORMAL HIGH (ref 0.44–1.00)
GFR calc Af Amer: 24 mL/min — ABNORMAL LOW (ref >60)
GFR calc non Af Amer: 20 mL/min — ABNORMAL LOW (ref >60)
GLUCOSE: 127 mg/dL — AB (ref 70–99)
POTASSIUM: 4 mmol/L (ref 3.5–5.1)
Sodium: 138 mmol/L (ref 135–145)

## 2018-08-11 MED ORDER — AMLODIPINE BESYLATE 5 MG PO TABS
10.0000 mg | ORAL_TABLET | Freq: Once | ORAL | Status: AC
  Administered 2018-08-11: 10 mg via ORAL
  Filled 2018-08-11: qty 2

## 2018-08-11 MED ORDER — MORPHINE SULFATE (PF) 4 MG/ML IV SOLN
4.0000 mg | Freq: Once | INTRAVENOUS | Status: AC
  Administered 2018-08-11: 4 mg via INTRAVENOUS
  Filled 2018-08-11: qty 1

## 2018-08-11 MED ORDER — PROCHLORPERAZINE EDISYLATE 10 MG/2ML IJ SOLN
10.0000 mg | Freq: Once | INTRAMUSCULAR | Status: AC
  Administered 2018-08-11: 10 mg via INTRAVENOUS
  Filled 2018-08-11: qty 2

## 2018-08-11 MED ORDER — HYDRALAZINE HCL 25 MG PO TABS
25.0000 mg | ORAL_TABLET | Freq: Once | ORAL | Status: AC
  Administered 2018-08-11: 25 mg via ORAL
  Filled 2018-08-11: qty 1

## 2018-08-11 MED ORDER — DIPHENHYDRAMINE HCL 50 MG/ML IJ SOLN
25.0000 mg | Freq: Once | INTRAMUSCULAR | Status: AC
  Administered 2018-08-11: 25 mg via INTRAVENOUS
  Filled 2018-08-11: qty 1

## 2018-08-11 NOTE — ED Provider Notes (Signed)
Complains of diffuse headache onset approximately 2 weeks ago..  She has not been to dialysis for a week because of bad headache.  She waited to come today because "I was scared. "  Shortness of breath or cough.  Denies fever.  On exam she is alert Glasgow Coma Score 15, moves all extremities well.  Neuro nerves II through XII grossly intact   Orlie Dakin, MD 08/11/18 1332

## 2018-08-11 NOTE — ED Triage Notes (Signed)
Pt c/o headache and nausea. Pt recently had stroke. Pt a&ox4. Pt is HD pt on THS. Pt missed all HD treatments last week.

## 2018-08-11 NOTE — ED Provider Notes (Signed)
[facility] EMERGENCY DEPARTMENT Provider Note   CSN: [id number] Arrival date & time: 08/11/18  1054     History   Chief Complaint Chief Complaint  Patient presents with  . Headache    HPI Rhonda Lynch is a 62 y.o. female.  Rhonda Lynch is a 62 y.o. Female with a history of ESRD on HD, hypertension, diabetes and recent hemorrhagic left temporal stroke at the beginning of November, who presents to the emergency department for evaluation of headache.  She reports diffuse headache is been present over the last week and a half.  She has not taken anything to treat this pain.  She reports she avoided coming in for reevaluation because she was scared she could have had another stroke.  She denies any associated fevers or neck stiffness.  No vision changes she has had some photophobia and nausea with no vomiting.  No facial asymmetry or speech changes.  No numbness weakness or tingling.  No dizziness.  Patient reports because she has been feeling well she has not been to dialysis over the past week.  She denies any worsening shortness of breath or fluid overload, no lower extremity swelling.  No fevers or chills, no cough.  No chest pain.  No abdominal pain.     Past Medical History:  Diagnosis Date  . Chronic kidney disease   . Diabetes mellitus without complication ([place])   . ESRD (end stage renal disease) ([place])   . Hypertension   . Oxygen deficiency    2 L at night  . Stroke ([place])   . Substance abuse ([place])    has been clean for 4 months     Patient Active Problem List   Diagnosis Date Noted  . ICH (intracerebral hemorrhage) ([place]) small left caudate due to HTN 07/17/2018  . HCAP (healthcare-associated pneumonia) 06/21/2018  . Substance induced mood disorder ([place]) 05/15/2018  . Agitation   . Hemodialysis catheter infection, initial encounter ([place])   . 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 Pulmonary embolism ([place]) 04/27/2018  . Sepsis ([place]) 04/26/2018  . Dyspnea  04/26/2018  . Chest pain 04/26/2018  . Tachycardia 04/26/2018  . Abdominal pain 04/18/2018  . DM (diabetes mellitus), type 2 with renal complications ([place]) 41/32/4401  . Right flank pain 04/18/2018  . Acute respiratory failure with hypoxia ([place]) 04/12/2018  . SVT (supraventricular tachycardia) ([place])   . Chronic renal failure   . Diabetes ([place]) 04/02/2018  . Hypertension   . Noncompliance of patient with renal dialysis ([place])   . Acute pulmonary edema ([facility])   . Elevated troponin 03/19/2018  . AKI (acute kidney injury) ([place])   . Fever   . 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 Diffuse pulmonary alveolar hemorrhage   . Hypoxemia   . Pulmonary infiltrate   . 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[place])   . ESRD (end stage renal disease) ([place]) 02/12/2018  . Acute respiratory failure ([place]) 02/12/2018  . Alcohol abuse 02/12/2018  . Anemia 02/12/2018  . Cocaine abuse ([place]) 02/12/2018  . Epigastric pain 02/12/2018  . Pneumonia 02/12/2018  . TOBACCO ABUSE 03/08/2010  . PNEUMONIA 03/08/2010  . UTI 03/08/2010  . ELEVATED BLOOD PRESSURE WITHOUT DIAGNOSIS OF HYPERTENSION 03/08/2010    Past Surgical History:  Procedure Laterality Date  . AV FISTULA PLACEMENT Left 03/05/2018   Procedure: ARTERIOVENOUS (AV) FISTULA CREATION  LEFT UPPER EXTREMITY;  Surgeon: [name], MD;  Location: [facility] OR;  Service: Vascular;  Laterality: Left;  . AV FISTULA  PLACEMENT Left 07/21/2018   Procedure: ARTERIOVENOUS (AV) FISTULA CREATION;  Surgeon: Marty Heck, MD;  Location: [facility];  Service: Vascular;  Laterality: Left;  . HEMATOMA EVACUATION Left 03/06/2018   Procedure: EVACUATION HEMATOMA LEFT ARM;  Surgeon: Angelia Mould, MD;  Location: [facility];  Service: Vascular;  Laterality: Left;  . INSERTION OF DIALYSIS CATHETER Left 04/29/2018   Procedure: INSERTION OF DIALYSIS CATHETER LEFT INTERNAL JUGULAR;  Surgeon: Angelia Mould, MD;  Location: [facility];  Service: Vascular;   Laterality: Left;  . IR FLUORO GUIDE CV LINE RIGHT  02/18/2018  . IR FLUORO GUIDE CV LINE RIGHT  02/26/2018  . IR REMOVAL TUN CV CATH W/O FL  04/27/2018  . IR US GUIDE VASC ACCESS RIGHT  02/18/2018  . TEE WITHOUT CARDIOVERSION N/A 04/29/2018   Procedure: TRANSESOPHAGEAL ECHOCARDIOGRAM (TEE);  Surgeon: Sueanne Margarita, MD;  Location: [facility] ENDOSCOPY;  Service: Cardiovascular;  Laterality: N/A;     OB History   None      Home Medications    Prior to Admission medications   Medication Sig Start Date End Date Taking? Authorizing Provider  amLODipine (NORVASC) 10 MG tablet Take 1 tablet (10 mg total) by mouth daily. 07/24/18   Donzetta Starch, NP  Blood Glucose Monitoring Suppl (ACCU-CHEK AVIVA) device Use as instructed 06/05/18 06/05/19  Clent Demark, PA-C  calcitRIOL (ROCALTROL) 0.25 MCG capsule Take 1 capsule (0.25 mcg total) by mouth Every Tuesday,Thursday,and Saturday with dialysis. 07/24/18   Donzetta Starch, NP  fluticasone (FLONASE) 50 MCG/ACT nasal spray Place 2 sprays into both nostrils daily. 06/24/18   Lavina Hamman, MD  glucose blood (ACCU-CHEK AVIVA) test strip Use twice per day. 06/05/18   Clent Demark, PA-C  hydrALAZINE (APRESOLINE) 25 MG tablet Take 1 tablet (25 mg total) by mouth every 8 (eight) hours. 07/23/18   Donzetta Starch, NP  hydrOXYzine (ATARAX/VISTARIL) 25 MG tablet Take 1 tablet (25 mg total) by mouth 3 (three) times daily as needed for itching. 06/23/18   Lavina Hamman, MD  insulin aspart (NOVOLOG) 100 UNIT/ML injection Inject 5 Units into the skin 3 (three) times daily with meals. 07/23/18   Donzetta Starch, NP  insulin glargine (LANTUS) 100 UNIT/ML injection Inject 0.05 mLs (5 Units total) into the skin at bedtime. 04/13/18   Nita Sells, MD  Insulin Syringe-Needle U-100 (INSULIN SYRINGE .3CC/31GX5/16") 31G X 5/16" 0.3 ML MISC Inject 1 each into the skin 4 (four) times daily. 06/27/18   Clent Demark, PA-C  Lancets (ACCU-CHEK SOFT [facility]) lancets  Use twice per day. 06/05/18   Clent Demark, PA-C  multivitamin (RENA-VIT) TABS tablet Take 1 tablet by mouth at bedtime. 04/13/18   Nita Sells, MD  zolpidem (AMBIEN) 5 MG tablet Take 1 tablet (5 mg total) by mouth at bedtime as needed for sleep. 04/13/18   Nita Sells, MD    Family History Family History  Problem Relation Age of Onset  . Autoimmune disease Neg Hx     Social History Social History   Tobacco Use  . Smoking status: Former Smoker    Packs/day: 0.50    Last attempt to quit: 04/08/2018    Years since quitting: 0.3  . Smokeless tobacco: Never Used  . Tobacco comment: trying to quit 04/02/2018  Substance Use Topics  . Alcohol use: Yes  . Drug use: Yes    Types: Cocaine, Heroin    Comment: See notes     Allergies   Acetaminophen  Review of Systems Review of Systems  Constitutional: Negative for chills and fever.  HENT: Negative.   Eyes: Positive for photophobia. Negative for pain and visual disturbance.  Respiratory: Negative for cough and shortness of breath.   Cardiovascular: Negative for chest pain.  Gastrointestinal: Positive for nausea. Negative for abdominal pain, diarrhea and vomiting.  Genitourinary: Negative for dysuria, flank pain and frequency.  Musculoskeletal: Negative for arthralgias, back pain, myalgias, neck pain and neck stiffness.  Skin: Negative for color change and rash.  Neurological: Positive for headaches. Negative for dizziness, seizures, syncope, facial asymmetry, speech difficulty, weakness, light-headedness and numbness.     Physical Exam Updated Vital Signs BP (!) 195/109 (BP Location: Right Arm)   Pulse 85   Temp 99 F (37.2 C) (Oral)   Resp 16   Ht 5\' 1"  (1.549 m)   Wt 60.1 kg   SpO2 100%   BMI 25.03 kg/m   Physical Exam  Constitutional: She is oriented to person, place, and time. She appears well-developed and well-nourished. She does not appear ill. No distress.  HENT:  Head: Normocephalic and  atraumatic.  Mouth/Throat: Oropharynx is clear and moist.  Eyes: Pupils are equal, round, and reactive to light. EOM are normal. Right eye exhibits no discharge. Left eye exhibits no discharge. Right eye exhibits no nystagmus. Left eye exhibits no nystagmus.  Neck: Neck supple. No neck rigidity. No Brudzinski's sign and no Kernig's sign noted.  Cardiovascular: Normal rate, regular rhythm, normal heart sounds and intact distal pulses. Exam reveals no gallop and no friction rub.  No murmur heard. Pulmonary/Chest: Effort normal and breath sounds normal. No respiratory distress.  Respirations equal and unlabored, patient able to speak in full sentences, lungs clear to auscultation bilaterally  Abdominal: Soft. Bowel sounds are normal. She exhibits no distension and no mass. There is no tenderness. There is no guarding.  Abdomen soft, nondistended, nontender to palpation in all quadrants without guarding or peritoneal signs  Musculoskeletal: She exhibits no edema or tenderness.  Dialysis fistula present in the left upper extremity with no overlying erythema or warmth, palpable thrill and bruit on auscultation  Neurological: She is alert and oriented to person, place, and time. She displays normal reflexes. Coordination normal. GCS eye subscore is 4. GCS verbal subscore is 5. GCS motor subscore is 6.  Speech is clear, able to follow commands CN III-XII intact Normal strength in upper and lower extremities bilaterally including dorsiflexion and plantar flexion, strong and equal grip strength Sensation normal to light and sharp touch Moves extremities without ataxia, coordination intact Normal finger to nose and rapid alternating movements No pronator drift  Skin: Skin is warm and dry. She is not diaphoretic.  Psychiatric: She has a normal mood and affect. Her behavior is normal.  Nursing note and vitals reviewed.    ED Treatments / Results  Labs (all labs ordered are listed, but only abnormal  results are displayed) Labs Reviewed  BASIC METABOLIC PANEL - Abnormal; Notable for the following components:      Result Value   CO2 21 (*)    Glucose, Bld 127 (*)    BUN 28 (*)    Creatinine, Ser 2.43 (*)    Calcium 8.8 (*)    GFR calc non Af Amer 20 (*)    GFR calc Af Amer 24 (*)    All other components within normal limits  CBC WITH DIFFERENTIAL/PLATELET - Abnormal; Notable for the following components:   RBC 5.41 (*)    Hemoglobin  11.4 (*)    MCV 70.8 (*)    MCH 21.1 (*)    MCHC 29.8 (*)    RDW 21.8 (*)    All other components within normal limits    EKG EKG Interpretation  Date/Time:  Monday August 11 2018 11:09:59 EST Ventricular Rate:  97 PR Interval:    QRS Duration: 78 QT Interval:  367 QTC Calculation: 467 R Axis:   -25 Text Interpretation:  Sinus rhythm Borderline short PR interval Left ventricular hypertrophy No significant change since last tracing Confirmed by Orlie Dakin [phone]) on 08/11/2018 11:12:12 AM   Radiology Dg Chest 2 View  Result Date: 08/11/2018 CLINICAL DATA:  Cough, congestion and fever for 1 week. EXAM: CHEST - 2 VIEW COMPARISON:  Single-view of the chest 06/22/2018 and 06/20/2018. PA and lateral chest 06/07/2018 and 04/26/2018. FINDINGS: There is cardiomegaly and vascular congestion. Dialysis catheter is seen. Aortic atherosclerosis is noted. Opacity in the periphery of the right lung base is unchanged compared to the most recent exam. The left lung is clear. No pneumothorax or pleural effusion. IMPRESSION: No change in a small focus of airspace opacity in the right lower lobe since the most recent examination worrisome for residual pneumonia. Recommend continued follow-up to clearing. Cardiomegaly and vascular congestion. Electronically Signed   By: Inge Rise M.D.   On: 08/11/2018 12:33   Ct Head Wo Contrast  Result Date: 08/11/2018 CLINICAL DATA:  Headache and nausea.  Recent stroke. EXAM: CT HEAD WITHOUT CONTRAST TECHNIQUE:  Contiguous axial images were obtained from the base of the skull through the vertex without intravenous contrast. COMPARISON:  Multiple CT scans since June 21, 2018 FINDINGS: Brain: No subdural, epidural, or subarachnoid hemorrhage identified. The small parenchymal hemorrhage adjacent to the temporal horn of the left lateral ventricle is smaller in the interval but remains. This is best seen on axial images 13 and 14 no new sites of parenchymal hemorrhage identified. Ventricles and sulci are stable. Cerebellum, brainstem, and basal cisterns are normal. No mass effect or midline shift. No acute cortical ischemia or infarct. Vascular: Calcified atherosclerosis in the intracranial carotids. Skull: Normal. Negative for fracture or focal lesion. Sinuses/Orbits: Chronic mucosal thickening in the left maxillary sinus. Increased mucosal thickening in scattered ethmoid air cells. Other: None. IMPRESSION: 1. The previously identified mesial left temporal lobe hemorrhage is smaller in the interval consistent with a small amount of residual blood products. 2. No other acute abnormalities.  Sinus disease as above. Electronically Signed   By: Dorise Bullion III M.D   On: 08/11/2018 12:43    Procedures Procedures (including critical care time)  Medications Ordered in ED Medications  prochlorperazine (COMPAZINE) injection 10 mg (has no administration in time range)  diphenhydrAMINE (BENADRYL) injection 25 mg (has no administration in time range)     Initial Impression / Assessment and Plan / ED Course  I have reviewed the triage vital signs and the nursing notes.  Pertinent labs & imaging results that were available during my care of the patient were reviewed by me and considered in my medical decision making (see chart for details).  Patient presents for evaluation of severe diffuse headache for the past week and a half.  History of recent hemorrhagic stroke.  Reports headache was improved after admission  until symptoms started this past week.  Patient reports because she has not been feeling well she is missed a full week of dialysis.  Patient reports she was afraid to come back in because she did not  want to have had another stroke.  She denies chest pain or shortness of breath, no abdominal pain or distention has had some nausea with her headache, no vision changes, numbness, weakness.  No fevers or neck stiffness.  On arrival patient is very hypertensive, has not taken her blood pressure medications in the past 2 days.  I suspect this is contributing to her headache today.  She has not taken anything for her headache.  She has no focal neurologic deficits on exam, lungs are clear, abdomen is benign and dialysis fistula appears to be well functioning with no signs of infection.  Will get EKG, basic labs and CT of the head to assess for any change or worsening in patient's recent hemorrhagic stroke.  EKG without concerning ischemic changes.  Chest x-ray shows a small persistent opacity since last exam, although patient is not having any cough, shortness of breath or fevers to suggest pneumonia, and I favor this being more likely atelectasis.  Labs show no leukocytosis, stable hemoglobin, creatinine is 2.43 which is actually improved from her usual baseline, potassium is not elevated at 4.0, no other acute electrolyte derangements requiring intervention.  CT of the head shows that previous left medial temporal lobe hemorrhage is smaller in size consistent with small amount of retained blood products but no evidence of new bleeding and no other acute findings.  Headache treated with Compazine, Benadryl and morphine with significant improvement, patient reports headache has completely resolved.  She has no meningeal signs.  Her neuro exam has remained normal and lab work is otherwise reassuring.  Patient reports she is feeling much better and feels she will be able to go to her regular dialysis appointment  tomorrow.  At this time I feel she is stable for discharge home, she is ambulatory in the department with steady gait.  Patient seen and evaluated by Dr. Winfred Leeds who is in agreement with plan.  Patient expresses understanding and agreement.   Final Clinical Impressions(s) / ED Diagnoses   Final diagnoses:  Bad headache  Hypertension, unspecified type  ESRD on hemodialysis [facility])    ED Discharge Orders    None       Jacqlyn Larsen, [place] 08/13/18 0015    Orlie Dakin, MD 08/15/18 [phone]

## 2018-08-11 NOTE — Discharge Instructions (Addendum)
Your evaluation today is very reassuring, head CT does not show any worsening of your previous stroke.  Your headache is likely due to your elevated blood pressure, please make sure you are taking your blood pressure medication regularly and make sure you go to dialysis tomorrow.  Follow-up with your primary care doctor.  Your chest x-ray showed a small area that they thought could be persisting pneumonia but she did not have symptoms consistent with pneumonia today follow-up with them regarding this.  Return if you develop worsening headache, visual changes, numbness or weakness in any of your extremities or any other new or concerning symptoms.

## 2018-08-11 NOTE — ED Notes (Signed)
Pt ambulated without difficulty

## 2018-08-11 NOTE — ED Notes (Signed)
Pt transported to CT ?

## 2018-08-19 ENCOUNTER — Other Ambulatory Visit: Payer: Self-pay

## 2018-08-25 ENCOUNTER — Ambulatory Visit: Payer: Medicare Other | Admitting: Neurology

## 2018-08-26 ENCOUNTER — Encounter: Payer: Self-pay | Admitting: Neurology

## 2018-08-26 ENCOUNTER — Encounter (HOSPITAL_COMMUNITY): Payer: Self-pay

## 2018-08-26 ENCOUNTER — Telehealth: Payer: Self-pay

## 2018-08-26 ENCOUNTER — Encounter: Payer: Self-pay | Admitting: Vascular Surgery

## 2018-08-26 NOTE — Telephone Encounter (Signed)
PT no show for appt on 08/25/2018.

## 2018-08-27 ENCOUNTER — Encounter: Payer: Self-pay | Admitting: Vascular Surgery

## 2018-09-22 ENCOUNTER — Inpatient Hospital Stay (HOSPITAL_COMMUNITY): Payer: Medicare Other

## 2018-09-22 ENCOUNTER — Encounter (HOSPITAL_COMMUNITY): Payer: Self-pay | Admitting: *Deleted

## 2018-09-22 ENCOUNTER — Other Ambulatory Visit: Payer: Self-pay

## 2018-09-22 ENCOUNTER — Inpatient Hospital Stay (HOSPITAL_COMMUNITY)
Admission: EM | Admit: 2018-09-22 | Discharge: 2018-10-13 | DRG: 628 | Discharge status: Against Medical Advice | Payer: Medicare Other | Attending: Internal Medicine | Admitting: Internal Medicine

## 2018-09-22 DIAGNOSIS — B1920 Unspecified viral hepatitis C without hepatic coma: Secondary | ICD-10-CM | POA: Diagnosis not present

## 2018-09-22 DIAGNOSIS — N179 Acute kidney failure, unspecified: Secondary | ICD-10-CM | POA: Diagnosis present

## 2018-09-22 DIAGNOSIS — Z9115 Patient's noncompliance with renal dialysis: Secondary | ICD-10-CM | POA: Diagnosis not present

## 2018-09-22 DIAGNOSIS — F419 Anxiety disorder, unspecified: Secondary | ICD-10-CM

## 2018-09-22 DIAGNOSIS — G4700 Insomnia, unspecified: Secondary | ICD-10-CM | POA: Diagnosis present

## 2018-09-22 DIAGNOSIS — E875 Hyperkalemia: Secondary | ICD-10-CM | POA: Diagnosis not present

## 2018-09-22 DIAGNOSIS — N186 End stage renal disease: Secondary | ICD-10-CM | POA: Diagnosis not present

## 2018-09-22 DIAGNOSIS — M25512 Pain in left shoulder: Secondary | ICD-10-CM | POA: Diagnosis not present

## 2018-09-22 DIAGNOSIS — F17210 Nicotine dependence, cigarettes, uncomplicated: Secondary | ICD-10-CM | POA: Diagnosis present

## 2018-09-22 DIAGNOSIS — D631 Anemia in chronic kidney disease: Secondary | ICD-10-CM | POA: Diagnosis not present

## 2018-09-22 DIAGNOSIS — M79622 Pain in left upper arm: Secondary | ICD-10-CM | POA: Diagnosis not present

## 2018-09-22 DIAGNOSIS — D509 Iron deficiency anemia, unspecified: Secondary | ICD-10-CM

## 2018-09-22 DIAGNOSIS — B182 Chronic viral hepatitis C: Secondary | ICD-10-CM | POA: Diagnosis present

## 2018-09-22 DIAGNOSIS — R5383 Other fatigue: Secondary | ICD-10-CM | POA: Diagnosis not present

## 2018-09-22 DIAGNOSIS — B49 Unspecified mycosis: Secondary | ICD-10-CM | POA: Diagnosis present

## 2018-09-22 DIAGNOSIS — IMO0001 Reserved for inherently not codable concepts without codable children: Secondary | ICD-10-CM

## 2018-09-22 DIAGNOSIS — R252 Cramp and spasm: Secondary | ICD-10-CM | POA: Diagnosis not present

## 2018-09-22 DIAGNOSIS — N2581 Secondary hyperparathyroidism of renal origin: Secondary | ICD-10-CM | POA: Diagnosis present

## 2018-09-22 DIAGNOSIS — M542 Cervicalgia: Secondary | ICD-10-CM | POA: Diagnosis not present

## 2018-09-22 DIAGNOSIS — M79602 Pain in left arm: Secondary | ICD-10-CM | POA: Diagnosis not present

## 2018-09-22 DIAGNOSIS — I97648 Postprocedural seroma of a circulatory system organ or structure following other circulatory system procedure: Secondary | ICD-10-CM | POA: Diagnosis not present

## 2018-09-22 DIAGNOSIS — M79609 Pain in unspecified limb: Secondary | ICD-10-CM | POA: Diagnosis not present

## 2018-09-22 DIAGNOSIS — R109 Unspecified abdominal pain: Secondary | ICD-10-CM | POA: Diagnosis present

## 2018-09-22 DIAGNOSIS — I132 Hypertensive heart and chronic kidney disease with heart failure and with stage 5 chronic kidney disease, or end stage renal disease: Secondary | ICD-10-CM | POA: Diagnosis present

## 2018-09-22 DIAGNOSIS — Z515 Encounter for palliative care: Secondary | ICD-10-CM | POA: Diagnosis present

## 2018-09-22 DIAGNOSIS — R4182 Altered mental status, unspecified: Secondary | ICD-10-CM | POA: Diagnosis not present

## 2018-09-22 DIAGNOSIS — R509 Fever, unspecified: Secondary | ICD-10-CM

## 2018-09-22 DIAGNOSIS — Z978 Presence of other specified devices: Secondary | ICD-10-CM | POA: Diagnosis not present

## 2018-09-22 DIAGNOSIS — R112 Nausea with vomiting, unspecified: Secondary | ICD-10-CM | POA: Diagnosis not present

## 2018-09-22 DIAGNOSIS — T8141XA Infection following a procedure, superficial incisional surgical site, initial encounter: Secondary | ICD-10-CM | POA: Diagnosis not present

## 2018-09-22 DIAGNOSIS — Z79899 Other long term (current) drug therapy: Secondary | ICD-10-CM

## 2018-09-22 DIAGNOSIS — Y92239 Unspecified place in hospital as the place of occurrence of the external cause: Secondary | ICD-10-CM | POA: Diagnosis not present

## 2018-09-22 DIAGNOSIS — R768 Other specified abnormal immunological findings in serum: Secondary | ICD-10-CM | POA: Diagnosis not present

## 2018-09-22 DIAGNOSIS — Z888 Allergy status to other drugs, medicaments and biological substances status: Secondary | ICD-10-CM

## 2018-09-22 DIAGNOSIS — R0789 Other chest pain: Secondary | ICD-10-CM | POA: Diagnosis not present

## 2018-09-22 DIAGNOSIS — Z992 Dependence on renal dialysis: Secondary | ICD-10-CM

## 2018-09-22 DIAGNOSIS — E1122 Type 2 diabetes mellitus with diabetic chronic kidney disease: Secondary | ICD-10-CM | POA: Diagnosis not present

## 2018-09-22 DIAGNOSIS — I776 Arteritis, unspecified: Secondary | ICD-10-CM | POA: Diagnosis not present

## 2018-09-22 DIAGNOSIS — L03114 Cellulitis of left upper limb: Secondary | ICD-10-CM | POA: Diagnosis present

## 2018-09-22 DIAGNOSIS — I160 Hypertensive urgency: Secondary | ICD-10-CM | POA: Diagnosis present

## 2018-09-22 DIAGNOSIS — Z8673 Personal history of transient ischemic attack (TIA), and cerebral infarction without residual deficits: Secondary | ICD-10-CM

## 2018-09-22 DIAGNOSIS — Z86711 Personal history of pulmonary embolism: Secondary | ICD-10-CM

## 2018-09-22 DIAGNOSIS — Y848 Other medical procedures as the cause of abnormal reaction of the patient, or of later complication, without mention of misadventure at the time of the procedure: Secondary | ICD-10-CM | POA: Diagnosis not present

## 2018-09-22 DIAGNOSIS — Z9981 Dependence on supplemental oxygen: Secondary | ICD-10-CM

## 2018-09-22 DIAGNOSIS — D72829 Elevated white blood cell count, unspecified: Secondary | ICD-10-CM | POA: Diagnosis not present

## 2018-09-22 DIAGNOSIS — R51 Headache: Secondary | ICD-10-CM | POA: Diagnosis present

## 2018-09-22 DIAGNOSIS — F1990 Other psychoactive substance use, unspecified, uncomplicated: Secondary | ICD-10-CM | POA: Diagnosis not present

## 2018-09-22 DIAGNOSIS — Z86718 Personal history of other venous thrombosis and embolism: Secondary | ICD-10-CM

## 2018-09-22 DIAGNOSIS — I10 Essential (primary) hypertension: Secondary | ICD-10-CM | POA: Diagnosis present

## 2018-09-22 DIAGNOSIS — E119 Type 2 diabetes mellitus without complications: Secondary | ICD-10-CM

## 2018-09-22 DIAGNOSIS — Z794 Long term (current) use of insulin: Secondary | ICD-10-CM

## 2018-09-22 DIAGNOSIS — N185 Chronic kidney disease, stage 5: Secondary | ICD-10-CM | POA: Diagnosis not present

## 2018-09-22 DIAGNOSIS — I120 Hypertensive chronic kidney disease with stage 5 chronic kidney disease or end stage renal disease: Secondary | ICD-10-CM | POA: Diagnosis not present

## 2018-09-22 DIAGNOSIS — I82409 Acute embolism and thrombosis of unspecified deep veins of unspecified lower extremity: Secondary | ICD-10-CM | POA: Diagnosis not present

## 2018-09-22 DIAGNOSIS — T82898A Other specified complication of vascular prosthetic devices, implants and grafts, initial encounter: Secondary | ICD-10-CM | POA: Diagnosis not present

## 2018-09-22 DIAGNOSIS — Z8619 Personal history of other infectious and parasitic diseases: Secondary | ICD-10-CM | POA: Diagnosis not present

## 2018-09-22 DIAGNOSIS — T8149XA Infection following a procedure, other surgical site, initial encounter: Secondary | ICD-10-CM

## 2018-09-22 DIAGNOSIS — E785 Hyperlipidemia, unspecified: Secondary | ICD-10-CM | POA: Diagnosis not present

## 2018-09-22 DIAGNOSIS — Z8669 Personal history of other diseases of the nervous system and sense organs: Secondary | ICD-10-CM | POA: Diagnosis not present

## 2018-09-22 DIAGNOSIS — M7989 Other specified soft tissue disorders: Secondary | ICD-10-CM | POA: Diagnosis not present

## 2018-09-22 HISTORY — DX: Dependence on renal dialysis: Z99.2

## 2018-09-22 HISTORY — DX: End stage renal disease: N18.6

## 2018-09-22 LAB — CBC WITH DIFFERENTIAL/PLATELET
Abs Immature Granulocytes: 0 10*3/uL (ref 0.00–0.07)
BASOS ABS: 0.1 10*3/uL (ref 0.0–0.1)
BASOS PCT: 1 %
EOS ABS: 0.1 10*3/uL (ref 0.0–0.5)
EOS PCT: 2 %
HCT: 33.4 % — ABNORMAL LOW (ref 36.0–46.0)
HEMOGLOBIN: 9.5 g/dL — AB (ref 12.0–15.0)
LYMPHS ABS: 1.8 10*3/uL (ref 0.7–4.0)
Lymphocytes Relative: 26 %
MCH: 21.2 pg — ABNORMAL LOW (ref 26.0–34.0)
MCHC: 28.4 g/dL — ABNORMAL LOW (ref 30.0–36.0)
MCV: 74.4 fL — ABNORMAL LOW (ref 80.0–100.0)
MONOS PCT: 8 %
Monocytes Absolute: 0.6 10*3/uL (ref 0.1–1.0)
NRBC: 0 % (ref 0.0–0.2)
Neutro Abs: 4.4 10*3/uL (ref 1.7–7.7)
Neutrophils Relative %: 63 %
Platelets: 173 10*3/uL (ref 150–400)
RBC: 4.49 MIL/uL (ref 3.87–5.11)
RDW: 24.7 % — ABNORMAL HIGH (ref 11.5–15.5)
WBC: 7 10*3/uL (ref 4.0–10.5)
nRBC: 0 /100 WBC

## 2018-09-22 LAB — COMPREHENSIVE METABOLIC PANEL
ALBUMIN: 2.6 g/dL — AB (ref 3.5–5.0)
ALT: 31 U/L (ref 0–44)
ANION GAP: 8 (ref 5–15)
AST: 87 U/L — ABNORMAL HIGH (ref 15–41)
Alkaline Phosphatase: 49 U/L (ref 38–126)
BUN: 58 mg/dL — ABNORMAL HIGH (ref 8–23)
CHLORIDE: 117 mmol/L — AB (ref 98–111)
CO2: 13 mmol/L — AB (ref 22–32)
Calcium: 8.6 mg/dL — ABNORMAL LOW (ref 8.9–10.3)
Creatinine, Ser: 7.82 mg/dL — ABNORMAL HIGH (ref 0.44–1.00)
GFR calc Af Amer: 6 mL/min — ABNORMAL LOW (ref >60)
GFR calc non Af Amer: 5 mL/min — ABNORMAL LOW (ref >60)
GLUCOSE: 88 mg/dL (ref 70–99)
POTASSIUM: 7.1 mmol/L — AB (ref 3.5–5.1)
SODIUM: 138 mmol/L (ref 135–145)
TOTAL PROTEIN: 7.1 g/dL (ref 6.5–8.1)
Total Bilirubin: 2.4 mg/dL — ABNORMAL HIGH (ref 0.3–1.2)

## 2018-09-22 LAB — BASIC METABOLIC PANEL
Anion gap: 8 (ref 5–15)
BUN: 20 mg/dL (ref 8–23)
CO2: 24 mmol/L (ref 22–32)
Calcium: 8.3 mg/dL — ABNORMAL LOW (ref 8.9–10.3)
Chloride: 104 mmol/L (ref 98–111)
Creatinine, Ser: 3.94 mg/dL — ABNORMAL HIGH (ref 0.44–1.00)
GFR calc Af Amer: 13 mL/min — ABNORMAL LOW (ref >60)
GFR calc non Af Amer: 11 mL/min — ABNORMAL LOW (ref >60)
GLUCOSE: 159 mg/dL — AB (ref 70–99)
Potassium: 3.4 mmol/L — ABNORMAL LOW (ref 3.5–5.1)
Sodium: 136 mmol/L (ref 135–145)

## 2018-09-22 LAB — GLUCOSE, CAPILLARY
Glucose-Capillary: 149 mg/dL — ABNORMAL HIGH (ref 70–99)
Glucose-Capillary: 145 mg/dL — ABNORMAL HIGH (ref 70–99)

## 2018-09-22 LAB — INFLUENZA PANEL BY PCR (TYPE A & B)
Influenza A By PCR: NEGATIVE
Influenza B By PCR: NEGATIVE

## 2018-09-22 LAB — I-STAT TROPONIN, ED: TROPONIN I, POC: 0.12 ng/mL — AB (ref 0.00–0.08)

## 2018-09-22 LAB — TROPONIN I: Troponin I: 0.16 ng/mL (ref <0.03)

## 2018-09-22 MED ORDER — RAMELTEON 8 MG PO TABS
8.0000 mg | ORAL_TABLET | Freq: Every day | ORAL | Status: DC
  Administered 2018-09-23 – 2018-09-25 (×3): 8 mg via ORAL
  Filled 2018-09-22 (×4): qty 1

## 2018-09-22 MED ORDER — TRAMADOL HCL 50 MG PO TABS
ORAL_TABLET | ORAL | Status: AC
  Administered 2018-09-22: 50 mg via ORAL
  Filled 2018-09-22: qty 1

## 2018-09-22 MED ORDER — SODIUM CHLORIDE 0.9 % IV SOLN
1.0000 g | Freq: Once | INTRAVENOUS | Status: AC
  Administered 2018-09-22: 1 g via INTRAVENOUS
  Filled 2018-09-22: qty 10

## 2018-09-22 MED ORDER — CALCITRIOL 0.25 MCG PO CAPS
0.2500 ug | ORAL_CAPSULE | ORAL | Status: DC
  Administered 2018-09-23 – 2018-10-06 (×6): 0.25 ug via ORAL
  Filled 2018-09-22 (×2): qty 1

## 2018-09-22 MED ORDER — CHLORHEXIDINE GLUCONATE CLOTH 2 % EX PADS
6.0000 | MEDICATED_PAD | Freq: Every day | CUTANEOUS | Status: DC
  Administered 2018-09-23: 6 via TOPICAL

## 2018-09-22 MED ORDER — INSULIN ASPART 100 UNIT/ML ~~LOC~~ SOLN
0.0000 [IU] | Freq: Every day | SUBCUTANEOUS | Status: DC
  Administered 2018-09-29 – 2018-10-06 (×2): 4 [IU] via SUBCUTANEOUS

## 2018-09-22 MED ORDER — ALBUTEROL SULFATE (2.5 MG/3ML) 0.083% IN NEBU
10.0000 mg | INHALATION_SOLUTION | Freq: Once | RESPIRATORY_TRACT | Status: AC
  Administered 2018-09-22: 10 mg via RESPIRATORY_TRACT
  Filled 2018-09-22 (×2): qty 12

## 2018-09-22 MED ORDER — PATIROMER SORBITEX CALCIUM 8.4 G PO PACK
16.8000 g | PACK | Freq: Every day | ORAL | Status: DC
  Administered 2018-09-24: 16.8 g via ORAL
  Filled 2018-09-22 (×4): qty 2

## 2018-09-22 MED ORDER — INSULIN ASPART 100 UNIT/ML IV SOLN
5.0000 [IU] | Freq: Once | INTRAVENOUS | Status: AC
  Administered 2018-09-22: 5 [IU] via INTRAVENOUS

## 2018-09-22 MED ORDER — PROCHLORPERAZINE EDISYLATE 10 MG/2ML IJ SOLN
10.0000 mg | Freq: Once | INTRAMUSCULAR | Status: DC
  Filled 2018-09-22 (×2): qty 2

## 2018-09-22 MED ORDER — INSULIN ASPART 100 UNIT/ML ~~LOC~~ SOLN
0.0000 [IU] | Freq: Three times a day (TID) | SUBCUTANEOUS | Status: DC
  Administered 2018-09-23 – 2018-09-26 (×3): 2 [IU] via SUBCUTANEOUS
  Administered 2018-09-27 – 2018-09-28 (×2): 1 [IU] via SUBCUTANEOUS
  Administered 2018-09-28: 2 [IU] via SUBCUTANEOUS
  Administered 2018-09-29: 7 [IU] via SUBCUTANEOUS
  Administered 2018-09-30 (×2): 2 [IU] via SUBCUTANEOUS
  Administered 2018-10-01: 1 [IU] via SUBCUTANEOUS
  Administered 2018-10-01: 2 [IU] via SUBCUTANEOUS
  Administered 2018-10-03 – 2018-10-06 (×4): 1 [IU] via SUBCUTANEOUS
  Administered 2018-10-07 (×3): 3 [IU] via SUBCUTANEOUS
  Administered 2018-10-08 (×2): 2 [IU] via SUBCUTANEOUS
  Administered 2018-10-08 – 2018-10-09 (×2): 1 [IU] via SUBCUTANEOUS
  Administered 2018-10-09: 2 [IU] via SUBCUTANEOUS
  Administered 2018-10-10 (×2): 1 [IU] via SUBCUTANEOUS
  Administered 2018-10-11: 2 [IU] via SUBCUTANEOUS
  Administered 2018-10-12 (×2): 1 [IU] via SUBCUTANEOUS
  Administered 2018-10-13: 3 [IU] via SUBCUTANEOUS

## 2018-09-22 MED ORDER — HEPARIN SODIUM (PORCINE) 5000 UNIT/ML IJ SOLN: 5000.0000 [IU] | Freq: Three times a day (TID) | INTRAMUSCULAR | Status: DC

## 2018-09-22 MED ORDER — TRAMADOL HCL 50 MG PO TABS
50.0000 mg | ORAL_TABLET | Freq: Once | ORAL | Status: AC | PRN
  Administered 2018-09-22: 50 mg via ORAL
  Filled 2018-09-22: qty 1

## 2018-09-22 MED ORDER — TRAMADOL HCL 50 MG PO TABS
50.0000 mg | ORAL_TABLET | Freq: Once | ORAL | Status: AC
  Administered 2018-09-22: 50 mg via ORAL

## 2018-09-22 MED ORDER — HYDRALAZINE HCL 25 MG PO TABS
25.0000 mg | ORAL_TABLET | Freq: Three times a day (TID) | ORAL | Status: DC
  Administered 2018-09-22 – 2018-09-24 (×3): 25 mg via ORAL
  Filled 2018-09-22 (×3): qty 1

## 2018-09-22 MED ORDER — SODIUM CHLORIDE 0.9% FLUSH
3.0000 mL | Freq: Two times a day (BID) | INTRAVENOUS | Status: DC
  Administered 2018-09-22 – 2018-10-07 (×24): 3 mL via INTRAVENOUS

## 2018-09-22 MED ORDER — HEPARIN SODIUM (PORCINE) 1000 UNIT/ML IJ SOLN
INTRAMUSCULAR | Status: AC
  Administered 2018-09-22: 1000 [IU]
  Filled 2018-09-22: qty 4

## 2018-09-22 MED ORDER — RENA-VITE PO TABS
1.0000 | ORAL_TABLET | Freq: Every day | ORAL | Status: DC
  Administered 2018-09-22 – 2018-10-12 (×20): 1 via ORAL
  Filled 2018-09-22 (×21): qty 1

## 2018-09-22 MED ORDER — FLUCONAZOLE 100 MG PO TABS
200.0000 mg | ORAL_TABLET | Freq: Every day | ORAL | Status: DC
  Administered 2018-09-23 – 2018-09-30 (×7): 200 mg via ORAL
  Filled 2018-09-22 (×2): qty 2
  Filled 2018-09-22: qty 1
  Filled 2018-09-22 (×5): qty 2

## 2018-09-22 MED ORDER — SODIUM BICARBONATE 8.4 % IV SOLN
50.0000 meq | Freq: Once | INTRAVENOUS | Status: AC
  Administered 2018-09-22: 50 meq via INTRAVENOUS
  Filled 2018-09-22: qty 50

## 2018-09-22 MED ORDER — AMLODIPINE BESYLATE 10 MG PO TABS
10.0000 mg | ORAL_TABLET | Freq: Every day | ORAL | Status: DC
  Administered 2018-09-24 – 2018-09-26 (×3): 10 mg via ORAL
  Filled 2018-09-22 (×3): qty 1
  Filled 2018-09-22: qty 2

## 2018-09-22 MED ORDER — DIPHENHYDRAMINE HCL 50 MG/ML IJ SOLN
25.0000 mg | Freq: Once | INTRAMUSCULAR | Status: DC
  Filled 2018-09-22: qty 1

## 2018-09-22 MED ORDER — DEXTROSE 50 % IV SOLN
1.0000 | Freq: Once | INTRAVENOUS | Status: AC
  Administered 2018-09-22: 50 mL via INTRAVENOUS
  Filled 2018-09-22: qty 50

## 2018-09-22 MED ORDER — INSULIN GLARGINE 100 UNIT/ML ~~LOC~~ SOLN
5.0000 [IU] | Freq: Every day | SUBCUTANEOUS | Status: DC
  Administered 2018-09-22 – 2018-10-12 (×20): 5 [IU] via SUBCUTANEOUS
  Filled 2018-09-22 (×23): qty 0.05

## 2018-09-22 NOTE — ED Notes (Signed)
This RN attempted twice without success.

## 2018-09-22 NOTE — Progress Notes (Signed)
CRITICAL VALUE ALERT  Critical Value:  Troponin 0.16  Date & Time Notied:  09/22/2018 @ 2342  Provider Notified: Vogel,MD 09/23/2018 @00 :11  Orders Received/Actions taken: Order received in epic

## 2018-09-22 NOTE — ED Triage Notes (Signed)
Pt in c/o left arm swelling, states she is supposed to have a dialysis fistula placed in that arm and wants to have it evaluated, symptoms x1 month, pt also reports she has been out of town and has not completed her dialysis treatments in the last month

## 2018-09-22 NOTE — ED Notes (Signed)
IV team at bedside 

## 2018-09-22 NOTE — ED Provider Notes (Signed)
Rhonda Lynch   CSN: [id number] Arrival date & time: 09/22/18  1131     History   Chief Complaint Chief Complaint  Patient presents with  . Arm Pain  . Vascular Access Problem    HPI Rhonda Lynch is a 63 y.o. female.  Rhonda Lynch is a 63 y.o. female with a history of ESRD on HD, hypertension, diabetes, stroke and substance abuse, who presents to the emergency department evaluation of left arm swelling and discomfort.  Patient also reports that she has not been to dialysis in greater than 1 month because she has been out of town, not feeling well and afraid to return to dialysis.  She reports she had the first stage operation to have an AV fistula placed in the left arm about 2 months ago, this has not been used yet for dialysis, she reports she is continued to have some intermittent swelling and discomfort in this arm but has not noted any new redness or warmth.  No numbness, weakness or tingling in the arm.  She denies any associated chest pain but does report some shortness of breath which is worse when she tries to lay flat.  She does not have any lower extremity swelling or abdominal swelling or distention.  She denies any abdominal pain but does report feeling extremely nauseated she has had a few episodes of nonbloody emesis, and reports because of the nausea her appetite is been very poor.  She reports she has had little to eat or drink over the past few days.  No headaches, vision changes numbness or weakness initially reported.  Patient reports she is unsure exactly when the last time she attended dialysis was but reports she was scared to return to dialysis and thought she could get by without it.     Past Medical History:  Diagnosis Date  . Chronic kidney disease   . Diabetes mellitus without complication ([place])   . ESRD (end stage renal disease) ([place])   . Hypertension   . Oxygen deficiency    2 L at night  . Stroke  ([place])   . Substance abuse ([place])    has been clean for 4 months     Patient Active Problem List   Diagnosis Date Noted  . ICH (intracerebral hemorrhage) ([place]) small left caudate due to HTN 07/17/2018  . HCAP (healthcare-associated pneumonia) 06/21/2018  . Substance induced mood disorder ([place]) 05/15/2018  . Agitation   . Hemodialysis catheter infection, initial encounter ([place])   . 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 Pulmonary embolism ([place]) 04/27/2018  . Sepsis ([place]) 04/26/2018  . Dyspnea 04/26/2018  . Chest pain 04/26/2018  . Tachycardia 04/26/2018  . Abdominal pain 04/18/2018  . DM (diabetes mellitus), type 2 with renal complications ([place]) 30/03/6225  . Right flank pain 04/18/2018  . Acute respiratory failure with hypoxia ([place]) 04/12/2018  . SVT (supraventricular tachycardia) ([place])   . Chronic renal failure   . Diabetes ([place]) 04/02/2018  . Hypertension   . Noncompliance of patient with renal dialysis ([place])   . Acute pulmonary edema ([facility])   . Elevated troponin 03/19/2018  . AKI (acute kidney injury) ([place])   . Fever   . 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 Diffuse pulmonary alveolar hemorrhage   . Hypoxemia   . Pulmonary infiltrate   . 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[place])   . ESRD (end stage renal disease) ([place]) 02/12/2018  . Acute respiratory failure ([place]) 02/12/2018  .  Alcohol abuse 02/12/2018  . Anemia 02/12/2018  . Cocaine abuse ([place]) 02/12/2018  . Epigastric pain 02/12/2018  . Pneumonia 02/12/2018  . TOBACCO ABUSE 03/08/2010  . PNEUMONIA 03/08/2010  . UTI 03/08/2010  . ELEVATED BLOOD PRESSURE WITHOUT DIAGNOSIS OF HYPERTENSION 03/08/2010    Past Surgical History:  Procedure Laterality Date  . AV FISTULA PLACEMENT Left 03/05/2018   Procedure: ARTERIOVENOUS (AV) FISTULA CREATION  LEFT UPPER EXTREMITY;  Surgeon: [name], MD;  Location: [facility];  Service: Vascular;  Laterality: Left;  . AV FISTULA PLACEMENT Left 07/21/2018   Procedure: ARTERIOVENOUS (AV)  FISTULA CREATION;  Surgeon: Marty Heck, MD;  Location: [facility];  Service: Vascular;  Laterality: Left;  . HEMATOMA EVACUATION Left 03/06/2018   Procedure: EVACUATION HEMATOMA LEFT ARM;  Surgeon: Angelia Mould, MD;  Location: [facility];  Service: Vascular;  Laterality: Left;  . INSERTION OF DIALYSIS CATHETER Left 04/29/2018   Procedure: INSERTION OF DIALYSIS CATHETER LEFT INTERNAL JUGULAR;  Surgeon: Angelia Mould, MD;  Location: [facility];  Service: Vascular;  Laterality: Left;  . IR FLUORO GUIDE CV LINE RIGHT  02/18/2018  . IR FLUORO GUIDE CV LINE RIGHT  02/26/2018  . IR REMOVAL TUN CV CATH W/O FL  04/27/2018  . IR US GUIDE VASC ACCESS RIGHT  02/18/2018  . TEE WITHOUT CARDIOVERSION N/A 04/29/2018   Procedure: TRANSESOPHAGEAL ECHOCARDIOGRAM (TEE);  Surgeon: Sueanne Margarita, MD;  Location[facility] ENDOSCOPY;  Service: Cardiovascular;  Laterality: N/A;     OB History   No obstetric history on file.      Home Medications    Prior to Admission medications   Medication Sig Start Date End Date Taking? Authorizing Provider  amLODipine (NORVASC) 10 MG tablet Take 1 tablet (10 mg total) by mouth daily. 07/24/18   Donzetta Starch, NP  Blood Glucose Monitoring Suppl (ACCU-CHEK AVIVA) device Use as instructed 06/05/18 06/05/19  Clent Demark, PA-C  calcitRIOL (ROCALTROL) 0.25 MCG capsule Take 1 capsule (0.25 mcg total) by mouth Every Tuesday,Thursday,and Saturday with dialysis. 07/24/18   Donzetta Starch, NP  fluticasone (FLONASE) 50 MCG/ACT nasal spray Place 2 sprays into both nostrils daily. Patient not taking: Reported on 08/11/2018 06/24/18   Lavina Hamman, MD  glucose blood (ACCU-CHEK AVIVA) test strip Use twice per day. 06/05/18   Clent Demark, PA-C  hydrALAZINE (APRESOLINE) 25 MG tablet Take 1 tablet (25 mg total) by mouth every 8 (eight) hours. 07/23/18   Donzetta Starch, NP  hydrOXYzine (ATARAX/VISTARIL) 25 MG tablet Take 1 tablet (25 mg total) by mouth 3 (three) times daily as  needed for itching. 06/23/18   Lavina Hamman, MD  insulin aspart (NOVOLOG) 100 UNIT/ML injection Inject 5 Units into the skin 3 (three) times daily with meals. 07/23/18   Donzetta Starch, NP  insulin glargine (LANTUS) 100 UNIT/ML injection Inject 0.05 mLs (5 Units total) into the skin at bedtime. 04/13/18   Nita Sells, MD  Insulin Syringe-Needle U-100 (INSULIN SYRINGE .3CC/31GX5/16") 31G X 5/16" 0.3 ML MISC Inject 1 each into the skin 4 (four) times daily. 06/27/18   Clent Demark, PA-C  Lancets (ACCU-CHEK SOFT [facility]) lancets Use twice per day. 06/05/18   Clent Demark, PA-C  multivitamin (RENA-VIT) TABS tablet Take 1 tablet by mouth at bedtime. 04/13/18   Nita Sells, MD  zolpidem (AMBIEN) 5 MG tablet Take 1 tablet (5 mg total) by mouth at bedtime as needed for sleep. 04/13/18   Nita Sells, MD  Family History Family History  Problem Relation Age of Onset  . Autoimmune disease Neg Hx     Social History Social History   Tobacco Use  . Smoking status: Former Smoker    Packs/day: 0.50    Last attempt to quit: 04/08/2018    Years since quitting: 0.4  . Smokeless tobacco: Never Used  . Tobacco comment: trying to quit 04/02/2018  Substance Use Topics  . Alcohol use: Yes  . Drug use: Yes    Types: Cocaine, Heroin    Comment: See notes     Allergies   Acetaminophen   Review of Systems Review of Systems  Constitutional: Negative for chills and fever.  HENT: Negative for congestion, rhinorrhea and sore throat.   Eyes: Negative for visual disturbance.  Respiratory: Positive for shortness of breath. Negative for cough, chest tightness and wheezing.   Cardiovascular: Negative for chest pain and leg swelling.  Gastrointestinal: Positive for nausea and vomiting. Negative for abdominal distention, abdominal pain and diarrhea.  Genitourinary: Negative for dysuria and frequency.  Musculoskeletal: Positive for arthralgias and myalgias.  Skin: Negative for  color change and rash.  Neurological: Negative for dizziness, syncope, light-headedness and headaches.     Physical Exam Updated Vital Signs BP (!) 166/98 (BP Location: Right Arm)   Pulse 94   Temp 98.8 F (37.1 C) (Oral)   Resp 16   SpO2 98%   Physical Exam Vitals signs and nursing Lynch reviewed.  Constitutional:      General: She is not in acute distress.    Appearance: She is well-developed. She is not toxic-appearing or diaphoretic.     Comments: Chronically ill-appearing but in no acute distress  HENT:     Head: Normocephalic and atraumatic.     Mouth/Throat:     Mouth: Mucous membranes are moist.     Pharynx: Oropharynx is clear.  Eyes:     General:        Right eye: No discharge.        Left eye: No discharge.     Extraocular Movements: Extraocular movements intact.     Pupils: Pupils are equal, round, and reactive to light.  Neck:     Musculoskeletal: Neck supple.  Cardiovascular:     Rate and Rhythm: Normal rate and regular rhythm.     Pulses: Normal pulses.     Heart sounds: Normal heart sounds. No murmur. No friction rub. No gallop.   Pulmonary:     Effort: Pulmonary effort is normal. No respiratory distress.     Breath sounds: Normal breath sounds. No wheezing or rales.     Comments: Respirations equal and unlabored, patient able to speak in full sentences, lungs clear to auscultation bilaterally Right-sided chest port in place, old dressing in place which has not been cleaned or cared for recently but there is no overlying erythema, warmth, swelling or purulent drainage noted. Abdominal:     General: Abdomen is flat. Bowel sounds are normal. There is no distension.     Palpations: Abdomen is soft. There is no mass.     Tenderness: There is no abdominal tenderness. There is no guarding.     Comments: Abdomen soft, nondistended, nontender to palpation in all quadrants without guarding or peritoneal signs  Musculoskeletal:        General: No deformity.      Comments: Left upper extremity with AV fistula present at the antecubital fossa with palpable thrill and good audible bruit, no overlying erythema or warmth,  there is a small amount of nonpitting edema in the arm, 2+ radial pulse and good capillary refill, normal sensation and strength.  Skin:    General: Skin is warm and dry.     Capillary Refill: Capillary refill takes less than 2 seconds.  Neurological:     Mental Status: She is alert and oriented to person, place, and time. Mental status is at baseline.     Coordination: Coordination normal.     Comments: Speech is clear, able to follow commands CN III-XII intact Normal strength in upper and lower extremities bilaterally including dorsiflexion and plantar flexion, strong and equal grip strength Sensation normal to light and sharp touch Moves extremities without ataxia, coordination intact   Psychiatric:        Mood and Affect: Mood normal.        Behavior: Behavior normal.      ED Treatments / Results  Labs (all labs ordered are listed, but only abnormal results are displayed) Labs Reviewed  CBC WITH DIFFERENTIAL/PLATELET - Abnormal; Notable for the following components:      Result Value   Hemoglobin 9.5 (*)    HCT 33.4 (*)    MCV 74.4 (*)    MCH 21.2 (*)    MCHC 28.4 (*)    RDW 24.7 (*)    All other components within normal limits  COMPREHENSIVE METABOLIC PANEL - Abnormal; Notable for the following components:   Potassium 7.1 (*)    Chloride 117 (*)    CO2 13 (*)    BUN 58 (*)    Creatinine, Ser 7.82 (*)    Calcium 8.6 (*)    Albumin 2.6 (*)    AST 87 (*)    Total Bilirubin 2.4 (*)    GFR calc non Af Amer 5 (*)    GFR calc Af Amer 6 (*)    All other components within normal limits  I-STAT TROPONIN, ED - Abnormal; Notable for the following components:   Troponin i, poc 0.12 (*)    All other components within normal limits    EKG EKG Interpretation  Date/Time:  Monday September 22 2018 12:27:33 EST Ventricular  Rate:  99 PR Interval:    QRS Duration: 78 QT Interval:  356 QTC Calculation: 457 R Axis:   -29 Text Interpretation:  Sinus rhythm LVH with secondary repolarization abnormality No significant change since last tracing Confirmed by Dorie Rank [phone]) on 09/22/2018 12:29:44 PM   Radiology Dg Chest 2 View  Result Date: 09/22/2018 CLINICAL DATA:  Shortness of breath and mild chest pain. Post dialysis today. EXAM: CHEST - 2 VIEW COMPARISON:  08/11/2018 FINDINGS: Dialysis catheter in stable position, terminating in the cavoatrial junction. Mildly enlarged cardiac silhouette. Mediastinal contours appear intact. There is no evidence of focal airspace consolidation, pleural effusion or pneumothorax. Osseous structures are without acute abnormality. Soft tissues are grossly normal. IMPRESSION: Mildly enlarged cardiac silhouette. No evidence of pulmonary edema or consolidation. Electronically Signed   By: Fidela Salisbury M.D.   On: 09/22/2018 19:53    Procedures Procedures (including critical care time)  Medications Ordered in ED Medications  calcium chloride 1 g in sodium chloride 0.9 % 100 mL IVPB (1 g Intravenous Transfusing/Transfer 09/22/18 1530)  albuterol (PROVENTIL) (2.5 MG/3ML) 0.083% nebulizer solution 10 mg (10 mg Nebulization Given 09/22/18 1519)  insulin aspart (novoLOG) injection 5 Units (5 Units Intravenous Given 09/22/18 1523)    And  dextrose 50 % solution 50 mL (50 mLs Intravenous Given 09/22/18 1524)  sodium bicarbonate injection 50 mEq (50 mEq Intravenous Given 09/22/18 1524)     Initial Impression / Assessment and Plan / ED Course  I have reviewed the triage vital signs and the nursing notes.  Pertinent labs & imaging results that were available during my care of the patient were reviewed by me and considered in my medical decision making (see chart for details).  Patient presents for evaluation of left arm pain and swelling.  Had first stage of dialysis fistula placement  done on this arm 2 months ago, has not used this for dialysis yet.  Patient also reports that she has not been to dialysis for more than 1 month because she reports she was diffusely will load was referred to return.  She reports associated nausea, vomiting and poor appetite without abdominal pain she reports some shortness of breath which is worse when laying flat but denies chest pain.  On arrival patient noted to be hypertensive but all other vitals normal.  The left arm noted to have some slight nonpitting edema but AV fistula with palpable thrill and good bruit in the left arm was neurovascularly intact there were no overlying signs of infection such as redness or warmth.  Per chart review patient had called in with similar symptoms shortly after the surgery but had not been elevating her arm.  I have low suspicion for DVT, acute arterial occlusion or other complication with dialysis fistula.  Given the patient has been without dialysis for greater than a month I suspect nausea, and vomiting are due to uremia will check basic labs, EKG and troponin as well as chest x-ray.  EKG with sinus rhythm without concerning ischemic changes, no evidence of peaked T waves.  Opponent is elevated at 0.12, I suspect this is in the setting of patient not being dialyzed, on chart review patient has had similar troponin elevations in the past and she is not having any current chest pain.  Patient has no leukocytosis, hemoglobin stable when compared to baseline.  Potassium is significantly elevated at 7.1 likely in the setting of patient skipping dialysis, her creatinine is 7.82 and BUN is elevated at 58, patient likely with uremia causing nausea vomiting and general malaise.  Patient will likely need acute dialysis.  Will start patient on calcium chloride, insulin and dextrose, albuterol and bicarb for hyperkalemia and will consult nephrology.  Chest x-ray shows no evidence of pulmonary edema or other acute findings.  Return  to discuss results with patient and she is reporting severe headache, although she denied this earlier she is reporting severe headache for the past several days.  Patient does have history of stroke, has no focal neurologic deficits on exam will get CT head in order Compazine and Benadryl for treatment.  Case discussed with Dr. Carolin Sicks with nephrology who feels that patient will require immediate dialysis today also recommend starting patient on [place], will have nephrology APP see patient and put in dialysis orders but feels patient will require admission for repeat dialysis tomorrow and close cardiac monitoring given potassium.  Will consult for medicine admission.  Case discussed with internal medicine teaching service who will see and admit the patient, nephrology consulting for dialysis orders.  Internal medicine teaching service aware of pending CT head and they will follow-up on results.  Final Clinical Impressions(s) / ED Diagnoses   Final diagnoses:  Hyperkalemia  ESRD (end stage renal disease) ([place])  Left arm pain    ED Discharge Orders    None  Jacqlyn Larsen, PA-C 09/25/18 1656    Dorie Rank, MD 09/28/18 [phone]

## 2018-09-22 NOTE — ED Notes (Signed)
Spoke with IV team. Pt will next.

## 2018-09-22 NOTE — Consult Note (Addendum)
[facility] Renal Consultation Note    Indication for Consultation:  Management of ESRD/hemodialysis, anemia, hypertension/volume, and secondary hyperparathyroidism. PCP:  HPI: Rhonda Lynch is a 63 y.o. female with ESRD, HTN, Type 2 DM, Hx hemorrhagic stroke (07/2018), and Hx substance abuse who is being admitted with HTN urgency and hyperkalemia.  Presented to ED this morning with concern of L arm pain and headache. Also, c/o nausea and vomiting with almost every meal and generalized fatigue. Developed URI-like symptoms in December (runny nose, cough) which have persisted. Denies fever. In ED, labs showed K 7.1, CO2 13, Ca 8.6, BUN 58, Cr 7.8, WBC 7, Hgb 9.5, Trop 0.12. EKG without peaked T waves. Has not been dialyzed in > 1 mo. CXR and head CT pending. IV placement has been attempted twice, awaiting repeat to get IV CaGluc, insulin/D50, and bicarb. She has an order for PO patiromer which can be given now.  Reviewing HD records, has been discharged from her prior clinic after failing to show or accept phone calls since 07/22/18. Her last documented treatment was on 11/12. When asked why, she is very tearful and admits that she did not like going to dialysis, was scared, but thought she could get by without it. She continues to urinate regularly. Understands that her current symptoms are related to missing dialysis and says "I think I've learned by lesson."  Past Medical History:  Diagnosis Date  . Chronic kidney disease   . Diabetes mellitus without complication ([place])   . ESRD (end stage renal disease) ([place])   . Hypertension   . Oxygen deficiency    2 L at night  . Stroke ([place])   . Substance abuse ([place])    has been clean for 4 months    Past Surgical History:  Procedure Laterality Date  . AV FISTULA PLACEMENT Left 03/05/2018   Procedure: ARTERIOVENOUS (AV) FISTULA CREATION  LEFT UPPER EXTREMITY;  Surgeon: [name], MD;  Location: [facility];  Service: Vascular;   Laterality: Left;  . AV FISTULA PLACEMENT Left 07/21/2018   Procedure: ARTERIOVENOUS (AV) FISTULA CREATION;  Surgeon: Marty Heck, MD;  Location: [place];  Service: Vascular;  Laterality: Left;  . HEMATOMA EVACUATION Left 03/06/2018   Procedure: EVACUATION HEMATOMA LEFT ARM;  Surgeon: Angelia Mould, MD;  Location: [facility];  Service: Vascular;  Laterality: Left;  . INSERTION OF DIALYSIS CATHETER Left 04/29/2018   Procedure: INSERTION OF DIALYSIS CATHETER LEFT INTERNAL JUGULAR;  Surgeon: Angelia Mould, MD;  Location: [facility];  Service: Vascular;  Laterality: Left;  . IR FLUORO GUIDE CV LINE RIGHT  02/18/2018  . IR FLUORO GUIDE CV LINE RIGHT  02/26/2018  . IR REMOVAL TUN CV CATH W/O FL  04/27/2018  . IR US GUIDE VASC ACCESS RIGHT  02/18/2018  . TEE WITHOUT CARDIOVERSION N/A 04/29/2018   Procedure: TRANSESOPHAGEAL ECHOCARDIOGRAM (TEE);  Surgeon: Sueanne Margarita, MD;  Location: [facility] ENDOSCOPY;  Service: Cardiovascular;  Laterality: N/A;   Family History  Problem Relation Age of Onset  . Autoimmune disease Neg Hx    Social History:  reports that she quit smoking about 5 months ago. She smoked 0.50 packs per day. She has never used smokeless tobacco. She reports current alcohol use. She reports current drug use. Drugs: Cocaine and Heroin.  ROS: As per HPI otherwise negative.  Physical Exam: Vitals:   09/22/18 1236 09/22/18 1245 09/22/18 1400 09/22/18 1430  BP:  (!) 169/95  (!) 172/104  Pulse: 87 89 98  89  Resp: (!) 21 (!) 23 (!) 24 (!) 22  Temp:      TempSrc:      SpO2: 100% 100% 100% 98%     General: Well developed, well nourished. Tearful. Wearing nasal oxygen, but comfortable. Head: Normocephalic, atraumatic, sclera non-icteric. Neck: Supple without lymphadenopathy/masses. JVD elevated. Lungs: Scattered rhonchi, no overt rales on exam. Heart: RRR with normal S1, S2. No murmurs, rubs, or gallops appreciated. Abdomen: Soft, non-tender, non-distended with normoactive bowel  sounds.  Musculoskeletal:  Strength and tone appear normal for age. Lower extremities: Trace LE edema (ankles only) Neuro: Alert and oriented X 3. Moves all extremities spontaneously. Psych:  Responds to questions appropriately with a normal affect. Dialysis Access: TDC in R chest. 1st stage LUE BVT + bruit, + arm edema.  Allergies  Allergen Reactions  . Acetaminophen Nausea And Vomiting    Patient states Acetaminophen and Acetaminophen containing products make her nauseated. She demonstrated this 06/21/18 with nausea followed by emesis.   Prior to Admission medications   Medication Sig Start Date End Date Taking? Authorizing Provider  amLODipine (NORVASC) 10 MG tablet Take 1 tablet (10 mg total) by mouth daily. 07/24/18   Donzetta Starch, NP  Blood Glucose Monitoring Suppl (ACCU-CHEK AVIVA) device Use as instructed 06/05/18 06/05/19  Clent Demark, PA-C  calcitRIOL (ROCALTROL) 0.25 MCG capsule Take 1 capsule (0.25 mcg total) by mouth Every Tuesday,Thursday,and Saturday with dialysis. 07/24/18   Donzetta Starch, NP  fluticasone (FLONASE) 50 MCG/ACT nasal spray Place 2 sprays into both nostrils daily. Patient not taking: Reported on 08/11/2018 06/24/18   Lavina Hamman, MD  glucose blood (ACCU-CHEK AVIVA) test strip Use twice per day. 06/05/18   Clent Demark, PA-C  hydrALAZINE (APRESOLINE) 25 MG tablet Take 1 tablet (25 mg total) by mouth every 8 (eight) hours. 07/23/18   Donzetta Starch, NP  hydrOXYzine (ATARAX/VISTARIL) 25 MG tablet Take 1 tablet (25 mg total) by mouth 3 (three) times daily as needed for itching. 06/23/18   Lavina Hamman, MD  insulin aspart (NOVOLOG) 100 UNIT/ML injection Inject 5 Units into the skin 3 (three) times daily with meals. 07/23/18   Donzetta Starch, NP  insulin glargine (LANTUS) 100 UNIT/ML injection Inject 0.05 mLs (5 Units total) into the skin at bedtime. 04/13/18   Nita Sells, MD  Insulin Syringe-Needle U-100 (INSULIN SYRINGE .3CC/31GX5/16") 31G  X 5/16" 0.3 ML MISC Inject 1 each into the skin 4 (four) times daily. 06/27/18   Clent Demark, PA-C  Lancets (ACCU-CHEK SOFT [facility]) lancets Use twice per day. 06/05/18   Clent Demark, PA-C  multivitamin (RENA-VIT) TABS tablet Take 1 tablet by mouth at bedtime. 04/13/18   Nita Sells, MD  zolpidem (AMBIEN) 5 MG tablet Take 1 tablet (5 mg total) by mouth at bedtime as needed for sleep. 04/13/18   Nita Sells, MD   Current Facility-Administered Medications  Medication Dose Route Frequency Provider Last Rate Last Dose  . albuterol (PROVENTIL) (2.5 MG/3ML) 0.083% nebulizer solution 10 mg  10 mg Nebulization Once Ford, Kelsey N, PA-C      . calcium chloride 1 g in sodium chloride 0.9 % 100 mL IVPB  1 g Intravenous Once Ford, Kelsey N, PA-C      . [START ON 09/23/2018] Chlorhexidine Gluconate Cloth 2 % PADS 6 each  6 each Topical Q0600 Loren Racer, PA-C      . insulin aspart (novoLOG) injection 5 Units  5 Units Intravenous Once Benedetto Goad  N, PA-C       And  . dextrose 50 % solution 50 mL  1 ampule Intravenous Once Ford, Kelsey N, PA-C      . diphenhydrAMINE (BENADRYL) injection 25 mg  25 mg Intravenous Once Ford, Kelsey N, PA-C      . patiromer [facility]) packet 16.8 g  16.8 g Oral Daily Rosita Fire, MD      . prochlorperazine (COMPAZINE) injection 10 mg  10 mg Intravenous Once Ford, Kelsey N, [place]      . sodium bicarbonate injection 50 mEq  50 mEq Intravenous Once Jacqlyn Larsen, [place]       Current Outpatient Medications  Medication Sig Dispense Refill  . amLODipine (NORVASC) 10 MG tablet Take 1 tablet (10 mg total) by mouth daily. 30 tablet 2  . Blood Glucose Monitoring Suppl (ACCU-CHEK AVIVA) device Use as instructed 1 each 0  . calcitRIOL (ROCALTROL) 0.25 MCG capsule Take 1 capsule (0.25 mcg total) by mouth Every Tuesday,Thursday,and Saturday with dialysis.    . fluticasone (FLONASE) 50 MCG/ACT nasal spray Place 2 sprays into both nostrils daily.  (Patient not taking: Reported on 08/11/2018) 16 g 0  . glucose blood (ACCU-CHEK AVIVA) test strip Use twice per day. 60 each 5  . hydrALAZINE (APRESOLINE) 25 MG tablet Take 1 tablet (25 mg total) by mouth every 8 (eight) hours. 90 tablet 2  . hydrOXYzine (ATARAX/VISTARIL) 25 MG tablet Take 1 tablet (25 mg total) by mouth 3 (three) times daily as needed for itching. 30 tablet 0  . insulin aspart (NOVOLOG) 100 UNIT/ML injection Inject 5 Units into the skin 3 (three) times daily with meals.    . insulin glargine (LANTUS) 100 UNIT/ML injection Inject 0.05 mLs (5 Units total) into the skin at bedtime. 10 mL 11  . Insulin Syringe-Needle U-100 (INSULIN SYRINGE .3CC/31GX5/16") 31G X 5/16" 0.3 ML MISC Inject 1 each into the skin 4 (four) times daily. 120 each 5  . Lancets (ACCU-CHEK SOFT TOUCH) lancets Use twice per day. 60 each 5  . multivitamin (RENA-VIT) TABS tablet Take 1 tablet by mouth at bedtime. 30 each 0  . zolpidem (AMBIEN) 5 MG tablet Take 1 tablet (5 mg total) by mouth at bedtime as needed for sleep. 30 tablet 0   Labs: Basic Metabolic Panel: Recent Labs  Lab 09/22/18 1317  NA 138  K 7.1*  CL 117*  CO2 13*  GLUCOSE 88  BUN 58*  CREATININE 7.82*  CALCIUM 8.6*   Liver Function Tests: Recent Labs  Lab 09/22/18 1317  AST 87*  ALT 31  ALKPHOS 49  BILITOT 2.4*  PROT 7.1  ALBUMIN 2.6*   CBC: Recent Labs  Lab 09/22/18 1317  WBC 7.0  NEUTROABS 4.4  HGB 9.5*  HCT 33.4*  MCV 74.4*  PLT 173   Dialysis Orders:  **She does NOT have an active HD unit, will need to undergo CLIP process again** Prev dialyzed at [facility] (4hr, EDW 59kg, 2K/2.25Ca, TDC, no heparin)  Assessment/Plan: 1.  Hyperkalemia: K 7.1 in setting of no HD x 2 mo. No peaked T waves on EKG. S/p CaGluc/Bicarb/insulin. Ordered for patiromer and will dialyze shortly. 2.  ESRD: Last HD 07/22/18, clearly uremic on exam. 3hr HD today with low BFR to prevent dialysis dysequilibrium - will need another HD tomorrow. Will  need to CLIP her again, and I doubt her prior center will take her back given Hx non-compliance. 3.  Hypertension/volume: BP very high - volume ^ on exam. UF  as tolerated. 4.  Anemia: Hgb 9.5 - will draw iron studies with next HD and start ESA at that time. 5.  Metabolic bone disease: Ca ok, Phos pending. 6.  Nutrition: Alb very low - has been unable to eat much d/t uremic nausea. Follow. 7.  Hx hemorrhagic CVA (07/2018): Repeat head CT pending. 8.  Dialysis access: Has [facility] which has not been cleaned correctly in 2 mo although no obvious drainage - will draw blood cultures to assure no infection. S/p L stage 1 BVT 11/11. Once stable, will ask [facility] to see her about getting the 2nd stage surgery done.  Veneta Penton, PA-C 09/22/2018, 3:02 PM  [facility] Pager: [phone]

## 2018-09-22 NOTE — ED Notes (Signed)
Elevated I-stat trop of 0.12 reported to [facility]

## 2018-09-22 NOTE — H&P (Signed)
Date: 09/22/2018               Patient Name:  Rhonda Lynch MRN: [id number]  DOB: [birth year] Age / Sex: 63 y.o., female   PCP: Clent Demark, PA-C              Medical Service: Internal Medicine Teaching Service              Attending Physician: Dr. Dorie Rank, MD    First Contact: Beau Fanny, [place] 3 Pager: [phone]  Second Contact: Dr. Earnest Conroy Pager: [phone]  Third Contact Dr. Amado Coe Pager: [phone]       After Hours (After 5p/  First Contact Pager: [phone]  weekends / holidays): Second Contact Pager: [phone]   Chief Complaint: Headache, nausea and vomiting  History of Present Illness: Rhonda Lynch is a 63 yo female with pmhx of ESRD, HTN, Type 2 DM, hx of hemorrhagic stroke (07/2018), and sx of substance abuse (alcohol and cocaine) who presents for headache, nausea and vomiting, and left arm pain. She states that she has had the headache for about a month and she has been taking "a lot" of ibuprofen every day but it has not been helping with the pain. She states that the headache comes and goes but that when she has the headache, it is progressively getting more painful. She also notes that with the worsening of her headaches, she has noticed that her vision is starting to get kind of blurry. The last time she had a headache like this was when she had her stroke so that concerned her even more and prompted her to seek help. She stated that she does not have any deficits from the previous stroke and has not noted any stroke like symptoms.   She complains of nausea and vomiting and generalized fatigue that has resulted in her not being able to keep down any food or drink over the past 2 weeks. She states that the vomiting did subside yesterday though she can not identify a reason for it to have subsided. She has had recent sick contact in a friend with pneumonia and does endorse some increased cough and SOB but has had these symptoms for the past few months without  resolution. Also of note, she states that she has been feeling feverish with chills over the past week.   On ROS, she notes mild SOB, cough, congestion, generalized fatigue, headache, subjective fevers, blurred vision.  On chart review, it looks like she has ESRD and started dialysis about 6 months ago. She still makes urine though. She has a history of noncompliance with dialysis sessions as well as a history of fistula infections which has resulted in inability to use her fistula and reliance on a port for her dialysis. She states that she does not have a nephrologist or dialysis center set up here since she moved back in town.    Meds:  Current Outpatient Medications  Medication Sig Dispense Refill  . amLODipine (NORVASC) 10 MG tablet Take 1 tablet (10 mg total) by mouth daily. 30 tablet 2  . Blood Glucose Monitoring Suppl (ACCU-CHEK AVIVA) device Use as instructed 1 each 0  . calcitRIOL (ROCALTROL) 0.25 MCG capsule Take 1 capsule (0.25 mcg total) by mouth Every Tuesday,Thursday,and Saturday with dialysis.    . fluticasone (FLONASE) 50 MCG/ACT nasal spray Place 2 sprays into both nostrils daily. (Patient not taking: Reported on 08/11/2018) 16 g 0  . glucose blood (ACCU-CHEK AVIVA) test strip Use  twice per day. 60 each 5  . hydrALAZINE (APRESOLINE) 25 MG tablet Take 1 tablet (25 mg total) by mouth every 8 (eight) hours. 90 tablet 2  . hydrOXYzine (ATARAX/VISTARIL) 25 MG tablet Take 1 tablet (25 mg total) by mouth 3 (three) times daily as needed for itching. 30 tablet 0  . insulin aspart (NOVOLOG) 100 UNIT/ML injection Inject 5 Units into the skin 3 (three) times daily with meals.    . insulin glargine (LANTUS) 100 UNIT/ML injection Inject 0.05 mLs (5 Units total) into the skin at bedtime. 10 mL 11  . Insulin Syringe-Needle U-100 (INSULIN SYRINGE .3CC/31GX5/16") 31G X 5/16" 0.3 ML MISC Inject 1 each into the skin 4 (four) times daily. 120 each 5  . Lancets (ACCU-CHEK SOFT TOUCH) lancets Use  twice per day. 60 each 5  . multivitamin (RENA-VIT) TABS tablet Take 1 tablet by mouth at bedtime. 30 each 0  . zolpidem (AMBIEN) 5 MG tablet Take 1 tablet (5 mg total) by mouth at bedtime as needed for sleep. 30 tablet 0    Allergies: Allergies as of 09/22/2018 - Review Complete 09/22/2018  Allergen Reaction Noted  . Acetaminophen Nausea And Vomiting 06/22/2018   Past Medical History:  Diagnosis Date  . Chronic kidney disease   . Type 2 Diabetes mellitus with renal complications (HCC)   . ESRD (end stage renal disease) ([place])   . Hypertension   . Substance abuse (HCC)--last cocaine use 1 month ago     Hx of Intracerebral Hemorrhage (07/2018)   Tobacco Use- current smoker 1.5 cigarettes/day   Hx of alcohol abuse      Past Surgical History:  Procedure Laterality Date  . AV FISTULA PLACEMENT Left 03/05/2018   Procedure: ARTERIOVENOUS (AV) FISTULA CREATION  LEFT UPPER EXTREMITY;  Surgeon: [name], MD;  Location: [facility] OR;  Service: Vascular;  Laterality: Left;  . AV FISTULA PLACEMENT Left 07/21/2018   Procedure: ARTERIOVENOUS (AV) FISTULA CREATION;  Surgeon: Marty Heck, MD;  Location[facility] OR;  Service: Vascular;  Laterality: Left;   Family History  Problem Relation Age of Onset  . Autoimmune disease Neg Hx    Social History: she recently moved back into [place] from [place]. She is living with a friend currently. Current smoker of 1.5 cigarrettes/day, but a significant smoking history in the past for "many years". History of heavy alcohol use but cut back to 1 beer every now and then for the past 6 months. She also states that she stopped smoking cocaine about a month ago.    Review of Systems: Pertinent items are noted in HPI.  Physical Exam: Blood pressure (!) 172/104, pulse 91, temperature 98.8 F (37.1 C), temperature source Oral, resp. rate (!) 25, SpO2 99 %. BP (!) 172/104   Pulse 91   Temp 98.8 F (37.1 C) (Oral)   Resp (!) 25   SpO2 99%  General  appearance: alert, cooperative and mild distress Head: Normocephalic, without obvious abnormality, atraumatic Eyes: EOM intact, blurred vision per pt but not formally tested, No ptosis noted Lungs: lung exam complicated by immobility during dialysis, coarse upper airways sounds noted on limited exam Heart: regular rate and rhythm, no click, no rub and no murmurs noted Abdomen: soft, non-tender; bowel sounds normal; no masses,  no organomegaly Extremities: edema minimal pitting edema noted only on left leg Pulses: radial pulses 2+ and symmetric Neurologic: CN 2,3,4,5,6,7,12 intact, strength +5 in lower extremities and grip strength, unable to test upper extremity strength d/t dialysis  Lab Orders     Culture, blood (routine x 2)     CBC with Differential  H/H 9.5/33.4  MCV 74.4     Comprehensive metabolic panel  K 7.1  Bicarb 13   BUN 58 Cr 7.82     I-stat troponin- 0.12   Assessment & Plan by Problem: Principal Problem:   Hyperkalemia Active Problems:   ESRD (end stage renal disease) (HCC)   AKI (acute kidney injury) ([place])   Hypertension   Abdominal pain   Type 2 diabetes mellitus ([place])  Ms. 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  Hyperkalemia: K 7.1 on admission. Hyperkalemia due to missed dialysis for the past month. She states that she was always feeling nauseous and sick after dialysis so she stopped going. No EKG changes. She received albuterol, Novolog 10 and D5, and Calcium chloride in the ED prior to urgent dialysis.   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 Cr baseline difficult to determine from chart review but elevated from 2.43 a month ago to 7.82 today. BUN elevated as well to 58, Bicarb decreased to 13. She still makes urine despite being dialysis dependent. She underwent urgent dialysis for hyperkalemia and will more than likely require serial dialysis but will follow nephro recommendations. Upon chart review, ESRD 2/2 to ANCA vasculitis but she is not currently on any medications for the vasculitis. Appears there is a history of cytoxin use that was d/c'ed d/t lack of efficacy but we will refer to nephrology consult for further management. She does not currently have a dialysis center or nephrologist office in [place] as she just moved back to the area, so she will need to undergo CLIPP process.  -Nephro consult and appreciate the recommendations -renal function panel -Calcitriol -Multi vitamin  Abdominal pain/Nausea/Vomiting/Fatigue: Most likely cause of this is uremia from poor dialysis compliance but need to rule out infection (flu vs bacteremia). She does note that she has had exposure to a friend with pneumonia recently and dose note some cough and congestion that could be explained by the influenza. The HD nurses also noted that her port site looked unclean so will get a blood culture to rule out bacteremia. -blood culture -Influenza panel -Chest Xray -cont to monitor for fever  Hypertension: Hx of hypertension that she is prescribed amlodipine 10 mg daily and hydralazine 25 mg TID but it does not appear that she is taking the medications. On admission her BP was 166/98 and increased to 216/123 on exam. Will resume her home medications -amlodipine 10 mg -hydralazine 25 mg -cont to monitor  Headache: Hx of intracerebral hemorrhage 07/2018 and during that admission she complained of similar symptoms as todays admission including worsening headache and blurred vision for a prolonged time. Previous hemorrhage due to  hypertensive urgency and small vessel disease. There were no neurologic findings on exam but will need to rule out hemorrhage given her recent history and non-compliance of HTN medications. Headache could be due to metabolic derangements but will get head CT.  -Head CT -Tramadol 50 mg for pain -Compazine 10 mg for pain  Type 2 DM: Hx of Type 2  DM, was previously on Lantus and Novolog but did not know what dosages. Glucose 88 on admission. Will start 5 Units of Lantus, and sliding scale insulin.  -CBG monitoring -Lantus 5 units -Sliding scale insulin  Hx of Hep C positive antibody: Hx of Hep C on chart review, will recheck hep C. Also getting Hep B labs.  -Hep C  -Hep B ab, ag, core antibody  Hx of Pulmonary embolism: Hx of PE in August of 2019 and was started on coumadin. It does not appear that she is on any anticoag therapy currently and was not even aware that she had the PE when asked about it on exam. Pending no brain bleed on CT we will consider starting her on anticoag therapy but at this point with recent ICH, will most likely refrain from anticoagulation.   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  Modena Nunnery D, DO 09/22/2018, 6:21 PM

## 2018-09-23 DIAGNOSIS — I120 Hypertensive chronic kidney disease with stage 5 chronic kidney disease or end stage renal disease: Secondary | ICD-10-CM

## 2018-09-23 DIAGNOSIS — I776 Arteritis, unspecified: Secondary | ICD-10-CM

## 2018-09-23 DIAGNOSIS — N186 End stage renal disease: Secondary | ICD-10-CM

## 2018-09-23 DIAGNOSIS — R112 Nausea with vomiting, unspecified: Secondary | ICD-10-CM

## 2018-09-23 DIAGNOSIS — D509 Iron deficiency anemia, unspecified: Secondary | ICD-10-CM

## 2018-09-23 DIAGNOSIS — Z8619 Personal history of other infectious and parasitic diseases: Secondary | ICD-10-CM

## 2018-09-23 DIAGNOSIS — Z992 Dependence on renal dialysis: Secondary | ICD-10-CM

## 2018-09-23 DIAGNOSIS — R5383 Other fatigue: Secondary | ICD-10-CM

## 2018-09-23 DIAGNOSIS — Z86711 Personal history of pulmonary embolism: Secondary | ICD-10-CM

## 2018-09-23 DIAGNOSIS — R109 Unspecified abdominal pain: Secondary | ICD-10-CM

## 2018-09-23 DIAGNOSIS — E875 Hyperkalemia: Principal | ICD-10-CM

## 2018-09-23 DIAGNOSIS — Z8669 Personal history of other diseases of the nervous system and sense organs: Secondary | ICD-10-CM

## 2018-09-23 DIAGNOSIS — Z9115 Patient's noncompliance with renal dialysis: Secondary | ICD-10-CM

## 2018-09-23 DIAGNOSIS — F1990 Other psychoactive substance use, unspecified, uncomplicated: Secondary | ICD-10-CM

## 2018-09-23 DIAGNOSIS — E1122 Type 2 diabetes mellitus with diabetic chronic kidney disease: Secondary | ICD-10-CM

## 2018-09-23 DIAGNOSIS — R51 Headache: Secondary | ICD-10-CM

## 2018-09-23 LAB — IRON AND TIBC
Iron: 54 ug/dL (ref 28–170)
Iron: 42 ug/dL (ref 28–170)
Saturation Ratios: 22 % (ref 10.4–31.8)
Saturation Ratios: 14 % (ref 10.4–31.8)
TIBC: 245 ug/dL — ABNORMAL LOW (ref 250–450)
TIBC: 291 ug/dL (ref 250–450)
UIBC: 191 ug/dL
UIBC: 249 ug/dL

## 2018-09-23 LAB — HEPATITIS B CORE ANTIBODY, TOTAL: Hep B Core Total Ab: NEGATIVE

## 2018-09-23 LAB — HEPATITIS B SURFACE ANTIGEN: HEP B S AG: NEGATIVE

## 2018-09-23 LAB — GLUCOSE, CAPILLARY
Glucose-Capillary: 155 mg/dL — ABNORMAL HIGH (ref 70–99)
Glucose-Capillary: 104 mg/dL — ABNORMAL HIGH (ref 70–99)
Glucose-Capillary: 110 mg/dL — ABNORMAL HIGH (ref 70–99)
Glucose-Capillary: 128 mg/dL — ABNORMAL HIGH (ref 70–99)

## 2018-09-23 LAB — CBC
HCT: 29.2 % — ABNORMAL LOW (ref 36.0–46.0)
HCT: 32.6 % — ABNORMAL LOW (ref 36.0–46.0)
Hemoglobin: 8.9 g/dL — ABNORMAL LOW (ref 12.0–15.0)
Hemoglobin: 10.1 g/dL — ABNORMAL LOW (ref 12.0–15.0)
MCH: 21.3 pg — ABNORMAL LOW (ref 26.0–34.0)
MCH: 21.9 pg — AB (ref 26.0–34.0)
MCHC: 30.5 g/dL (ref 30.0–36.0)
MCHC: 31 g/dL (ref 30.0–36.0)
MCV: 70 fL — ABNORMAL LOW (ref 80.0–100.0)
MCV: 70.6 fL — ABNORMAL LOW (ref 80.0–100.0)
PLATELETS: 153 10*3/uL (ref 150–400)
Platelets: 154 10*3/uL (ref 150–400)
RBC: 4.17 MIL/uL (ref 3.87–5.11)
RBC: 4.62 MIL/uL (ref 3.87–5.11)
RDW: 23.3 % — ABNORMAL HIGH (ref 11.5–15.5)
RDW: 23.4 % — ABNORMAL HIGH (ref 11.5–15.5)
WBC: 7.6 10*3/uL (ref 4.0–10.5)
WBC: 10.3 10*3/uL (ref 4.0–10.5)
nRBC: 0 % (ref 0.0–0.2)
nRBC: 0 % (ref 0.0–0.2)

## 2018-09-23 LAB — RENAL FUNCTION PANEL
ANION GAP: 9 (ref 5–15)
Albumin: 2.5 g/dL — ABNORMAL LOW (ref 3.5–5.0)
Albumin: 2.8 g/dL — ABNORMAL LOW (ref 3.5–5.0)
Anion gap: 11 (ref 5–15)
BUN: 23 mg/dL (ref 8–23)
BUN: 9 mg/dL (ref 8–23)
CO2: 24 mmol/L (ref 22–32)
CO2: 25 mmol/L (ref 22–32)
Calcium: 8.4 mg/dL — ABNORMAL LOW (ref 8.9–10.3)
Calcium: 8.5 mg/dL — ABNORMAL LOW (ref 8.9–10.3)
Chloride: 106 mmol/L (ref 98–111)
Chloride: 99 mmol/L (ref 98–111)
Creatinine, Ser: 4.51 mg/dL — ABNORMAL HIGH (ref 0.44–1.00)
Creatinine, Ser: 2.43 mg/dL — ABNORMAL HIGH (ref 0.44–1.00)
GFR calc Af Amer: 11 mL/min — ABNORMAL LOW (ref >60)
GFR calc Af Amer: 24 mL/min — ABNORMAL LOW (ref >60)
GFR calc non Af Amer: 10 mL/min — ABNORMAL LOW (ref >60)
GFR calc non Af Amer: 20 mL/min — ABNORMAL LOW (ref >60)
Glucose, Bld: 104 mg/dL — ABNORMAL HIGH (ref 70–99)
Glucose, Bld: 129 mg/dL — ABNORMAL HIGH (ref 70–99)
Phosphorus: 3.8 mg/dL (ref 2.5–4.6)
Phosphorus: 2.8 mg/dL (ref 2.5–4.6)
Potassium: 3.7 mmol/L (ref 3.5–5.1)
Potassium: 4 mmol/L (ref 3.5–5.1)
Sodium: 139 mmol/L (ref 135–145)
Sodium: 135 mmol/L (ref 135–145)

## 2018-09-23 LAB — HEPATITIS B SURFACE ANTIBODY,QUALITATIVE: Hep B S Ab: NONREACTIVE

## 2018-09-23 LAB — FERRITIN
Ferritin: 419 ng/mL — ABNORMAL HIGH (ref 11–307)
Ferritin: 573 ng/mL — ABNORMAL HIGH (ref 11–307)

## 2018-09-23 LAB — MRSA PCR SCREENING: MRSA by PCR: NEGATIVE

## 2018-09-23 LAB — TROPONIN I: Troponin I: 0.16 ng/mL (ref <0.03)

## 2018-09-23 MED ORDER — DIPHENHYDRAMINE HCL 25 MG PO CAPS
50.0000 mg | ORAL_CAPSULE | Freq: Once | ORAL | Status: AC
  Administered 2018-09-23: 50 mg via ORAL
  Filled 2018-09-23: qty 2

## 2018-09-23 MED ORDER — LIDOCAINE-PRILOCAINE 2.5-2.5 % EX CREA: 1.0000 "application " | TOPICAL_CREAM | CUTANEOUS | Status: DC | PRN

## 2018-09-23 MED ORDER — HEPARIN SODIUM (PORCINE) 1000 UNIT/ML DIALYSIS
1000.0000 [IU] | INTRAMUSCULAR | Status: DC | PRN
  Administered 2018-09-25: 1000 [IU] via INTRAVENOUS_CENTRAL

## 2018-09-23 MED ORDER — HEPARIN SODIUM (PORCINE) 5000 UNIT/ML IJ SOLN
5000.0000 [IU] | Freq: Three times a day (TID) | INTRAMUSCULAR | Status: DC
  Administered 2018-09-23 – 2018-10-09 (×32): 5000 [IU] via SUBCUTANEOUS
  Filled 2018-09-23 (×34): qty 1

## 2018-09-23 MED ORDER — DIPHENHYDRAMINE HCL 50 MG/ML IJ SOLN: 25.0000 mg | Freq: Once | INTRAMUSCULAR | Status: DC

## 2018-09-23 MED ORDER — PENTAFLUOROPROP-TETRAFLUOROETH EX AERO: 1.0000 "application " | INHALATION_SPRAY | CUTANEOUS | Status: DC | PRN

## 2018-09-23 MED ORDER — PROCHLORPERAZINE EDISYLATE 10 MG/2ML IJ SOLN
10.0000 mg | Freq: Once | INTRAMUSCULAR | Status: AC
  Administered 2018-09-23: 10 mg via INTRAVENOUS

## 2018-09-23 MED ORDER — ALTEPLASE 2 MG IJ SOLR: 2.0000 mg | Freq: Once | INTRAMUSCULAR | Status: DC | PRN

## 2018-09-23 MED ORDER — HEPARIN SODIUM (PORCINE) 1000 UNIT/ML DIALYSIS: 1000.0000 [IU] | INTRAMUSCULAR | Status: DC | PRN

## 2018-09-23 MED ORDER — ALTEPLASE 2 MG IJ SOLR
2.0000 mg | Freq: Once | INTRAMUSCULAR | Status: DC | PRN
  Filled 2018-09-23: qty 2

## 2018-09-23 MED ORDER — HEPARIN SODIUM (PORCINE) 1000 UNIT/ML IJ SOLN
INTRAMUSCULAR | Status: AC
  Administered 2018-09-23: 1000 [IU]
  Filled 2018-09-23: qty 4

## 2018-09-23 MED ORDER — SODIUM CHLORIDE 0.9 % IV SOLN: 100.0000 mL | INTRAVENOUS | Status: DC | PRN

## 2018-09-23 MED ORDER — LIDOCAINE HCL (PF) 1 % IJ SOLN: 5.0000 mL | INTRAMUSCULAR | Status: DC | PRN

## 2018-09-23 MED ORDER — PRO-STAT SUGAR FREE PO LIQD
30.0000 mL | Freq: Two times a day (BID) | ORAL | Status: DC
  Administered 2018-09-23 – 2018-10-08 (×9): 30 mL via ORAL
  Filled 2018-09-23 (×22): qty 30

## 2018-09-23 NOTE — Progress Notes (Signed)
Patient states "since taking that sleep medicine,I've been itching all over,I haven't been to sleep either." Patient observed scratching arms,no rashes noted.Text paged MD on call. Will contibue to monitor. Rhonda Lynch, Wonda Cheng, Therapist, sports

## 2018-09-23 NOTE — Progress Notes (Signed)
OT Cancellation Note  Patient Details Name: Rhonda Lynch MRN: [id number] DOB: April 04, 1956   Cancelled Treatment:    Reason Eval/Treat Not Completed: Medical issues which prohibited therapy(pain management) Ot to check back again at the most appropriate time  Richelle Ito, OTR/L  Acute Rehabilitation Services Pager: [phone] Office: [phone] .  09/23/2018, 11:39 AM

## 2018-09-23 NOTE — Progress Notes (Signed)
[facility] Progress Note   Subjective:   Seen and examined at bedside.  Reports continued HA, nausea/vomiting and abdominal pain.  Denies CP and SOB.  Reports HD yesterday tolerated well.  RUE edema slightly improved, has been ongoing for the last several weeks.  Denies numbness/tingling, pain in R hand.  Objective Vitals:   09/23/18 0016 09/23/18 0524 09/23/18 0742 09/23/18 0850  BP: (!) 167/95 (!) 172/95 (!) 162/91   Pulse: 92 92 83   Resp:  18    Temp:  99.6 F (37.6 C) 97.9 F (36.6 C)   TempSrc:  Oral Oral   SpO2:  100% 100%   Weight:    61.7 kg  Height:       Physical Exam General:NAD, chronically ill appearing female Heart:RRR, no MRG Lungs:mostly CTAB, BS decreased Abdomen:soft, NTND Extremities:no LE edema, +LU extremity edema Dialysis Access: R IJ TDC, 1st stage LU AVF +t  Filed Weights   09/22/18 1845 09/22/18 2054 09/23/18 0850  Weight: 61.3 kg 61.6 kg 61.7 kg    Intake/Output Summary (Last 24 hours) at 09/23/2018 1156 Last data filed at 09/23/2018 0700 Gross per 24 hour  Intake 60 ml  Output 1200 ml  Net -1140 ml    Additional Objective Labs: Basic Metabolic Panel: Recent Labs  Lab 09/22/18 1317 09/22/18 2202 09/23/18 0550  NA 138 136 139  K 7.1* 3.4* 3.7  CL 117* 104 106  CO2 13* 24 24  GLUCOSE 88 159* 104*  BUN 58* 20 23  CREATININE 7.82* 3.94* 4.51*  CALCIUM 8.6* 8.3* 8.4*  PHOS  --   --  3.8   Liver Function Tests: Recent Labs  Lab 09/22/18 1317 09/23/18 0550  AST 87*  --   ALT 31  --   ALKPHOS 49  --   BILITOT 2.4*  --   PROT 7.1  --   ALBUMIN 2.6* 2.5*   CBC: Recent Labs  Lab 09/22/18 1317 09/23/18 0550  WBC 7.0 7.6  NEUTROABS 4.4  --   HGB 9.5* 8.9*  HCT 33.4* 29.2*  MCV 74.4* 70.0*  PLT 173 153    Cardiac Enzymes: Recent Labs  Lab 09/22/18 2202 09/23/18 0550  TROPONINI 0.16* 0.16*   CBG: Recent Labs  Lab 09/22/18 1827 09/22/18 2122 09/23/18 0805 09/23/18 1100  GLUCAP 149* 145* 155*  104*   Studies/Results: Dg Chest 2 View  Result Date: 09/22/2018 CLINICAL DATA:  Shortness of breath and mild chest pain. Post dialysis today. EXAM: CHEST - 2 VIEW COMPARISON:  08/11/2018 FINDINGS: Dialysis catheter in stable position, terminating in the cavoatrial junction. Mildly enlarged cardiac silhouette. Mediastinal contours appear intact. There is no evidence of focal airspace consolidation, pleural effusion or pneumothorax. Osseous structures are without acute abnormality. Soft tissues are grossly normal. IMPRESSION: Mildly enlarged cardiac silhouette. No evidence of pulmonary edema or consolidation. Electronically Signed   By: Fidela Salisbury M.D.   On: 09/22/2018 19:53   Ct Head Wo Contrast  Result Date: 09/22/2018 CLINICAL DATA:  Left arm swelling. Symptoms for 1 month. Waiting for dialysis fistula to be placed in the left arm. Headaches for several days. EXAM: CT HEAD WITHOUT CONTRAST TECHNIQUE: Contiguous axial images were obtained from the base of the skull through the vertex without intravenous contrast. COMPARISON:  08/11/2018 FINDINGS: Brain: No evidence of acute infarction, hemorrhage, hydrocephalus, extra-axial collection or mass lesion/mass effect. Previously seen left medial temporal lobe focal hemorrhage is not seen today consistent with interval resolution. Vascular: Mild intracranial arterial calcifications. Skull:  Calvarium appears intact. No acute depressed skull fractures. Sinuses/Orbits: Mucosal thickening in the paranasal sinuses. No acute air-fluid levels. Mastoid air cells are clear. Other: None. IMPRESSION: No acute intracranial abnormalities. Electronically Signed   By: Lucienne Capers M.D.   On: 09/22/2018 20:22    Medications:  . amLODipine  10 mg Oral Daily  . calcitRIOL  0.25 mcg Oral Q T,Th,Sa-HD  . Chlorhexidine Gluconate Cloth  6 each Topical Q0600  . diphenhydrAMINE  25 mg Intravenous Once  . fluconazole  200 mg Oral Daily  . hydrALAZINE  25 mg Oral  Q8H  . insulin aspart  0-5 Units Subcutaneous QHS  . insulin aspart  0-9 Units Subcutaneous TID WC  . insulin glargine  5 Units Subcutaneous QHS  . multivitamin  1 tablet Oral QHS  . patiromer  16.8 g Oral Daily  . prochlorperazine  10 mg Intravenous Once  . ramelteon  8 mg Oral QHS  . sodium chloride flush  3 mL Intravenous Q12H    Dialysis Orders: **She does NOT have an active HD unit, will need to undergo CLIP process again** Prev dialyzed at [facility] (4hr, EDW 59kg, 2K/2.25Ca, TDC, no heparin)  Assessment/Plan: 1. Hyperkalemia: d/t missed HD x 2 months. K 7.1> 3.7,improved post HD 2. Uremia - due to missed HD, last on 07/22/18.  Labs improved post HD.  Continues to have symptoms, should improve over time with HD. 3. ESRD - HD yesterday and today.  CLIP started, doubtful previous clinic will accept her back due to non compliance.  Next HD on 1/16. 4. Anemia of CKD- Hgb 8.9. Iron studies to be drawn with HD today. Previously on ESA, will restart pending iron studies.  5. Secondary hyperparathyroidism - CCa and phos at goal. Follow trends.  6. HTN/volume - BP elevated, expect improvement post HD. Does not appear grossly volume overloaded on exam, titrate down volume as tolerated.  7. Nutrition - Alb 2.5, Renal diet w/fluid restrictions. Protein supplements, vitamins.  8. 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  9. Dialysis access - L IJ TDC, BC NGTD.  S/p L stage 1 BVT 11/11.  Once stable will ask VVS to see her about getting the 2nd stage surgery.  10. Hep C positive 11. 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 Per primary   Jen Mow, PA-C [facility] Pager: [phone] 09/23/2018,11:56 AM  LOS: 1 day

## 2018-09-23 NOTE — Progress Notes (Signed)
ANTICOAGULATION CONSULT NOTE - Initial Consult  Pharmacy Consult for heparin  Indication: VTE prophylaxis  Allergies  Allergen Reactions  . Acetaminophen Nausea And Vomiting    Patient states Acetaminophen and Acetaminophen containing products make her nauseated. She demonstrated this 06/21/18 with nausea followed by emesis.    Patient Measurements: Height: 5\' 1"  (154.9 cm) Weight: 135 lb 2.3 oz (61.3 kg) IBW/kg (Calculated) : 47.8   Vital Signs: Temp: 98 F (36.7 C) (01/14 1320) Temp Source: Oral (01/14 1320) BP: 135/87 (01/14 1400) Pulse Rate: 84 (01/14 1400)  Labs: Recent Labs    09/22/18 1317 09/22/18 2202 09/23/18 0550  HGB 9.5*  --  8.9*  HCT 33.4*  --  29.2*  PLT 173  --  153  CREATININE 7.82* 3.94* 4.51*  TROPONINI  --  0.16* 0.16*    Estimated Creatinine Clearance: 10.6 mL/min (A) (by C-G formula based on SCr of 4.51 mg/dL (H)).   Medical History: Past Medical History:  Diagnosis Date  . Diabetes mellitus without complication ([place])   . ESRD (end stage renal disease) on dialysis ([place])    "TTS; [facility]; they're moving me to another one" (09/22/2018)  . Hypertension   . Oxygen deficiency    2 L at night  . Stroke ([place])   . Substance abuse ([place])    has been clean for 4 months    Assessment: Asked by MD to order heparin for VTE prophylaxis. Patient has been ruled out for brain bleed per MD and now needs VTE prophylaxis. Will start sq heparin and pharmacy will sign off.   Goal of Therapy:   Monitor platelets by anticoagulation protocol: Yes   Plan:  Heparin 5000 units SQ q8h Pharmacy will sign off.   Khilynn Borntreger A. Levada Dy, PharmD, [place] Pager: [phone] Please utilize Amion for appropriate phone number to reach the unit pharmacist ([place])   09/23/2018,2:26 PM

## 2018-09-23 NOTE — Consult Note (Addendum)
CC:  In need of second stage BVT  HPI:  This is a 63 y.o. female who is s/p left 1st stage BVT on 07/21/18 by Dr. Carlis Abbott.  Pt was scheduled to return to our office in December for follow up duplex and discussion for 2nd stage but did not show.  VVS is consulted today for 2nd stage BVT.   She denies any pain/numbness in her left hand.    Pt was admitted yesterday with hyperkalemia and hypertensive urgency.  She was on dialysis but quit going and last HD treatment prior to admit was 07/22/18.  She was dialyzed yesterday and scheduled for dialysis today.  She is currently on dialysis dialyzing via tunneled dialysis catheter that was placed by Dr. Scot Dock in August 2019.   Allergies  Allergen Reactions  . Acetaminophen Nausea And Vomiting    Patient states Acetaminophen and Acetaminophen containing products make her nauseated. She demonstrated this 06/21/18 with nausea followed by emesis.    Current Facility-Administered Medications  Medication Dose Route Frequency Provider Last Rate Last Dose  . amLODipine (NORVASC) tablet 10 mg  10 mg Oral Daily Santos-Sanchez, Idalys, MD      . calcitRIOL (ROCALTROL) capsule 0.25 mcg  0.25 mcg Oral Q T,Th,Sa-HD Welford Roche, MD   0.25 mcg at 09/23/18 1122  . Chlorhexidine Gluconate Cloth 2 % PADS 6 each  6 each Topical Q0600 Welford Roche, MD   6 each at 09/23/18 0549  . diphenhydrAMINE (BENADRYL) injection 25 mg  25 mg Intravenous Once [place], Carley D, DO      . feeding supplement (PRO-STAT SUGAR FREE 64) liquid 30 mL  30 mL Oral BID Penninger, Ria Comment, PA      . fluconazole (DIFLUCAN) tablet 200 mg  200 mg Oral Daily Santos-Sanchez, Idalys, MD   200 mg at 09/23/18 0833  . hydrALAZINE (APRESOLINE) tablet 25 mg  25 mg Oral Q8H Welford Roche, MD   25 mg at 09/22/18 2042  . insulin aspart (novoLOG) injection 0-5 Units  0-5 Units Subcutaneous QHS Santos-Sanchez, Idalys, MD      . insulin aspart (novoLOG) injection 0-9  Units  0-9 Units Subcutaneous TID WC Welford Roche, MD   2 Units at 09/23/18 0835  . insulin glargine (LANTUS) injection 5 Units  5 Units Subcutaneous QHS Welford Roche, MD   5 Units at 09/22/18 2315  . multivitamin (RENA-VIT) tablet 1 tablet  1 tablet Oral QHS Welford Roche, MD   1 tablet at 09/22/18 2137  . patiromer Daryll Drown) packet 16.8 g  16.8 g Oral Daily Santos-Sanchez, Idalys, MD      . prochlorperazine (COMPAZINE) injection 10 mg  10 mg Intravenous Once Welford Roche, MD      . ramelteon (ROZEREM) tablet 8 mg  8 mg Oral QHS Isabelle Course, MD   8 mg at 09/23/18 0013  . sodium chloride flush (NS) 0.9 % injection 3 mL  3 mL Intravenous Q12H Santos-Sanchez, Merlene Morse, MD   3 mL at 09/23/18 1122     ROS:  See HPI  Physical Exam:  Vitals:   09/23/18 0524 09/23/18 0742  BP: (!) 172/95 (!) 162/91  Pulse: 92 83  Resp: 18   Temp: 99.6 F (37.6 C) 97.9 F (36.6 C)  SpO2: 100% 100%    Incision:  Healed nicely Extremities:  Easily palpable left radial pulse; good left hand grip;  Excellent thrill palpated over fistula throughout the upper arm.   Assessment/Plan:  This is a 63 y.o.  female who is s/p: 1st stage left BVT on 07/21/18 by Dr. Carlis Abbott admitted for hyperkalemia and hypertensive emergency and VVS is consulted for 2nd stage BVT  -pt will need duplex (ordered) to evaluate maturation of fistula and if fistula has matured, will discuss when to proceed with 2nd stage BVT.  -discussed with pt, however, she kept dozing off and unsure what she heard despite me waking her a couple of times.  Will need to discuss again once duplex is done.    Leontine Locket, PA-C [facility] [phone]  I agree with the above.  We will get a duplex in anticipation of scheduling a second stage basilic vein fistula.  I will notify Dr. Carlis Abbott that the patient is in the hospital  [facility]

## 2018-09-23 NOTE — Evaluation (Signed)
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  Clinical Impression  Pt admitted with above diagnosis. Pt currently with functional limitations due to the deficits listed below (see PT Problem List). Pt very lethargic on eval, could not hold a conversation while in the bed. Was able to get up and walk in hallway, 200' without AD with min-guard A. When ambulating pt was able to answer questions appropriately but did not offer any extra info. Fell back asleep immediately when returned to bed. Will follow acutely but do not anticipate PT needs after d/c.  Pt will benefit from skilled PT to increase their independence and safety with mobility to allow discharge to the venue listed below.       Follow Up Recommendations No PT follow up    Equipment Recommendations  None recommended by PT    Recommendations for Other Services       Precautions / Restrictions Precautions Precautions: None Restrictions Weight Bearing Restrictions: No      Mobility  Bed Mobility Overal bed mobility: Independent             General bed mobility comments: pt able to get in and out of bed without assist  Transfers Overall transfer level: Modified independent Equipment used: None             General transfer comment: pt stood safely without support  Ambulation/Gait Ambulation/Gait assistance: Min guard Gait Distance (Feet): 200 Feet Assistive device: None Gait Pattern/deviations: Step-through pattern;Wide base of support Gait velocity: decreased Gait velocity interpretation: 1.31  - 2.62 ft/sec, indicative of limited community ambulator General Gait Details: pt with mildly widened BOS and increased sway but no LOB during gait on level surface  Stairs            Wheelchair Mobility    Modified Rankin (Stroke Patients Only)       Balance Overall balance assessment: Mild deficits observed, not formally tested                                           Pertinent Vitals/Pain Pain Assessment: No/denies pain(premedicated)    Home Living Family/patient expects to be discharged to:: Private residence Living Arrangements: Non-relatives/Friends Available Help at Discharge: Available 24 hours/day Type of Home: House Home Access: Stairs to enter Entrance Stairs-Rails: Right;Left;Can reach both Technical brewer of Steps: 2 Home Layout: One level Home Equipment: Walker - 4 wheels      Prior Function Level of Independence: Independent         Comments: does not drive; does her finances and medication management; SCAT to dialysis or friends take her     Hand Dominance   Dominant Hand: Right    Extremity/Trunk Assessment   Upper Extremity Assessment Upper Extremity Assessment: Overall WFL for tasks assessed    Lower Extremity Assessment Lower Extremity Assessment: Overall WFL for tasks assessed    Cervical / Trunk Assessment Cervical / Trunk Assessment: Normal  Communication   Communication: No difficulties  Cognition Arousal/Alertness: Lethargic;Suspect due to medications  Behavior During Therapy: Flat affect Overall Cognitive Status: Difficult to assess                                 General Comments: pt very lethargic, falling asleep midsentence, so difficult to assess cognition. However, she was appropriate when ambulating though with minimal verbalization. When asked about HD she reports that she hates it and wants to stop but she's going to go today      General Comments General comments (skin  integrity, edema, etc.): pt back asleep within 60 secs of returning to supine    Exercises     Assessment/Plan    PT Assessment Patient needs continued PT services  PT Problem List Decreased balance;Decreased mobility;Decreased cognition;Pain       PT Treatment Interventions Gait training;Functional mobility training;Therapeutic activities;Therapeutic exercise;Balance training;Patient/family education;Neuromuscular re-education    PT Goals (Current goals can be found in the Care Plan section)  Acute Rehab PT Goals Patient Stated Goal: stop HD PT Goal Formulation: With patient Time For Goal Achievement: 10/07/18 Potential to Achieve Goals: Good    Frequency Min 3X/week   Barriers to discharge        Co-evaluation               AM-PAC PT "6 Clicks" Mobility  Outcome Measure Help needed turning from your back to your side while in a flat bed without using bedrails?: None Help needed moving from lying on your back to sitting on the side of a flat bed without using bedrails?: None Help needed moving to and from a bed to a chair (including a wheelchair)?: None Help needed standing up from a chair using your arms (e.g., wheelchair or bedside chair)?: None Help needed to walk in hospital room?: A Little Help needed climbing 3-5 steps with a railing? : A Little 6 Click Score: 22    End of Session Equipment Utilized During Treatment: Gait belt Activity Tolerance: Patient tolerated treatment well Patient left: in bed;with call bell/phone within reach;with family/visitor present Nurse Communication: Mobility status PT Visit Diagnosis: Muscle weakness (generalized) (M62.81)    Time: 5993-5701 PT Time Calculation (min) (ACUTE ONLY): 17 min   Charges:   PT Evaluation $PT Eval Moderate Complexity: [place]  Pager [phone] Office [place] 09/23/2018, 1:37 PM

## 2018-09-23 NOTE — Progress Notes (Signed)
Subjective: Rhonda Lynch is doing better today. She states that she still has a headache and the medicine they gave her for her headache made her itchy all night. States that the nausea and vomiting has subsided and she just has a little bit of abdominal pain that remains. Overall she feels a lot better but would just like for her headache to go away and to get some rest.   History provided by the youngest son: he states that his mother has been living on and off the "streets" for years and that she is doing all kinds of drugs including cocaine and heroin. He states that she has lived with his older brother for a period of time and will do okay and attend dialysis as she is supposed to but then she will disappear back to the streets. He has on multiple occasions had to get the police involved to help find her which has resulted in him getting "partial custody" of his mother. He seems to think that she may be drug seeking and would like for medical personnel to know this. He does not know of her most recent whereabouts or what specific drugs she has been using but seems fairly certain that she is still using substances and is "hard up".    Objective: Vital signs in last 24 hours: Vitals:   09/22/18 2041 09/22/18 2054 09/23/18 0016 09/23/18 0524  BP: (!) 170/75  (!) 167/95 (!) 172/95  Pulse: 91  92 92  Resp: 17   18  Temp: 98.5 F (36.9 C)   99.6 F (37.6 C)  TempSrc: Oral   Oral  SpO2: 100%   100%  Weight:  61.6 kg    Height:  5\' 1"  (1.549 m)    General appearance: alert, cooperative, no distress and sleepy but arousable Lungs: clear to auscultation bilaterally and no wheezes, crackles or rhonchi Heart: regular rate and rhythm, S1, S2 normal, no murmur, click, rub or gallop Abdomen: soft, non-tender; bowel sounds normal; no masses,  no organomegaly Extremities: edema minimal on left lower leg  Lab Results: K 3.7 Troponin 0.16 Head CT negative for acute bleed  Assessment/Plan: Principal  Problem:   Hyperkalemia Active Problems:   ESRD (end stage renal disease) (H[facility])   Microcytic anemia   AKI (acute kidney injury) ([place])   Hypertension   Abdominal pain   Type 2 diabetes mellitus ([place])   Fungemia   Rhonda. 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   Hyperkalemia: K 3.4 today. Hyperkalemia due to missed dialysis for the past 2 months. With rapid acting treatment (Ca gluconate/insulin+D5/albuterol/fluids) and urgent dialysis, K has decreased within normal limits. She will get dialysis again today per nephro recommendations.   -cont to monitor with morning BMPs -cont Veltessa tablet  ESRD on Dialysis 2/2 ANCA Vasculitis: Diagnosed with ESRD and started on dialysis about 6 months ago.Non-compliant with dialysis. Cr 7.8 on admission, now 3.94. Renal function panel resulted with uncorrected calcium 8.4, Phosphorus 3.8, albumin 2.5. Nephro starting the CLIPP process. Repeat dialysis today. Nephro on board will appreciate their recommendations. Will refer to nephro for necessary guidance on ANCA therapy/management.  -HD today -Nephrology consulted and they will begin the CLIPP process and consult Vascular surgery for  second stage of Brachio-basilic AV fistula surgery -cont Calcium and multivitamin  Abdominal pain/Nausea/Vomiting/Fatigue: Most likely cause of this is uremia from poor dialysis compliance but need to rule out infection. Influenza negative, Blood cultures negative at 24 hours. Chest Xray negative for signs of edema or consolidation to rule out pneumonia. If these symptoms are due to uremia, continued dialysis should help relieve them. Will cont to monitor tomorrow for  improvements.   -blood culture negative growth at 24 hours -cont to monitor for fever -Chlorhexidine cloths for port site antiseptic  Hypertension: Hx of hypertension that she is prescribed amlodipine 10 mg daily and hydralazine 25 mg TID but it does not appear that she is taking the medications. HTN presumably from volume overload 2/2 to no dialysis. Continue dialysis sessions should help lower BP. Will continue previously prescribed home BP medications.  -amlodipine 10 mg -hydralazine 25 mg -cont to monitor   Headache: Hx of intracerebral hemorrhage 07/2018. CT scan yesterday negative for signs of acute bleed, just remaining signs of old hemorrhage. She still has the headache today. It is presumably HTN and metabolic derangement related but with additional history given by the son today, it is also possible this could be withdrawal related.  -Tramadol 50 mg for pain -Compazine 10 mg for pain + benadryl -COWS protocol  Type 2 DM: Hx of Type 2 DM, was previously on Lantus and Novolog but did not know what dosages as well as not taking them. Glucose 140-150s. Cont 5 Units of Lantus, and sliding scale insulin.  -CBG monitoring -Lantus 5 units -Sliding scale insulin  Microcytic Anemia: HgB 8.9 with Hct 29.2 and MCV of 70.0. Anemia most likely 2/2 ESRD. Nephro getting iron studies with next HD session. Will follow up results of these labs.  -ferritin, iron and TIBC, and cbc pending  Hx of Hep C positive antibody: Hx of Hep C on chart review, will recheck hep C ab and reflex RNA. Hep B Ag, Ab, and Core labs all negative.  -Hep C ab with reflex RNA still processing  Hx of Pulmonary embolism: Hx of PE in August of 2019 and was started on coumadin. Likely provoked in the setting of acute illness.  Given that CT was negative for acute bleed, will initiate heparin for prophylaxis   Hx of Fungemia with possible TV involvement - seen by ID in 06/2018; recommended Fluconazole  indefinitely - will continue Fluconazole 200 mg PO daily    LOS: 1 Lynch   Synetta Shadow, Medical Student 09/23/2018, 7:42 AM  Attestation for Student Documentation:  I personally was present and performed or re-performed the history, physical exam and medical decision-making activities of this service and have verified that the service and findings are accurately documented in the student's note.  Modena Nunnery D, DO 09/23/2018, 3:48 PM

## 2018-09-24 ENCOUNTER — Inpatient Hospital Stay (HOSPITAL_COMMUNITY): Payer: Medicare Other

## 2018-09-24 DIAGNOSIS — D631 Anemia in chronic kidney disease: Secondary | ICD-10-CM

## 2018-09-24 DIAGNOSIS — R768 Other specified abnormal immunological findings in serum: Secondary | ICD-10-CM

## 2018-09-24 LAB — CBC
HCT: 32 % — ABNORMAL LOW (ref 36.0–46.0)
Hemoglobin: 9.8 g/dL — ABNORMAL LOW (ref 12.0–15.0)
MCH: 21.4 pg — ABNORMAL LOW (ref 26.0–34.0)
MCHC: 30.6 g/dL (ref 30.0–36.0)
MCV: 70 fL — ABNORMAL LOW (ref 80.0–100.0)
Platelets: 155 10*3/uL (ref 150–400)
RBC: 4.57 MIL/uL (ref 3.87–5.11)
RDW: 23 % — ABNORMAL HIGH (ref 11.5–15.5)
WBC: 8.7 10*3/uL (ref 4.0–10.5)
nRBC: 0 % (ref 0.0–0.2)

## 2018-09-24 LAB — PTH, INTACT AND CALCIUM
Calcium, Total (PTH): 8.3 mg/dL — ABNORMAL LOW (ref 8.7–10.3)
PTH: 128 pg/mL — AB (ref 15–65)

## 2018-09-24 LAB — GLUCOSE, CAPILLARY
GLUCOSE-CAPILLARY: 168 mg/dL — AB (ref 70–99)
Glucose-Capillary: 91 mg/dL (ref 70–99)
Glucose-Capillary: 174 mg/dL — ABNORMAL HIGH (ref 70–99)
Glucose-Capillary: 96 mg/dL (ref 70–99)

## 2018-09-24 LAB — COMMENT2 - HEP PANEL

## 2018-09-24 LAB — HEPATITIS C ANTIBODY (REFLEX)

## 2018-09-24 MED ORDER — PROCHLORPERAZINE EDISYLATE 10 MG/2ML IJ SOLN
10.0000 mg | Freq: Once | INTRAMUSCULAR | Status: AC
  Administered 2018-09-24: 10 mg via INTRAVENOUS
  Filled 2018-09-24: qty 2

## 2018-09-24 MED ORDER — METHOCARBAMOL 500 MG PO TABS
500.0000 mg | ORAL_TABLET | Freq: Once | ORAL | Status: AC | PRN
  Administered 2018-09-26: 500 mg via ORAL
  Filled 2018-09-24: qty 1

## 2018-09-24 MED ORDER — CHLORHEXIDINE GLUCONATE CLOTH 2 % EX PADS
6.0000 | MEDICATED_PAD | Freq: Every day | CUTANEOUS | Status: DC
  Administered 2018-09-25: 6 via TOPICAL

## 2018-09-24 MED ORDER — CYCLOBENZAPRINE HCL 5 MG PO TABS
5.0000 mg | ORAL_TABLET | Freq: Two times a day (BID) | ORAL | Status: DC | PRN
  Administered 2018-09-24 – 2018-09-30 (×5): 5 mg via ORAL
  Filled 2018-09-24 (×5): qty 1

## 2018-09-24 MED ORDER — DIPHENHYDRAMINE HCL 50 MG/ML IJ SOLN
25.0000 mg | Freq: Once | INTRAMUSCULAR | Status: AC
  Administered 2018-09-24: 25 mg via INTRAVENOUS
  Filled 2018-09-24: qty 1

## 2018-09-24 MED ORDER — ACETAMINOPHEN 325 MG PO TABS
650.0000 mg | ORAL_TABLET | Freq: Four times a day (QID) | ORAL | Status: DC | PRN
  Filled 2018-09-24 (×3): qty 2

## 2018-09-24 MED ORDER — SODIUM CHLORIDE 0.9 % IV SOLN
125.0000 mg | INTRAVENOUS | Status: DC
  Administered 2018-09-25 – 2018-10-06 (×5): 125 mg via INTRAVENOUS
  Filled 2018-09-24 (×12): qty 10

## 2018-09-24 NOTE — Evaluation (Signed)
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   Clinical Impression   Pt admitted with the above diagnoses and presents with below problem list. Pt will benefit from continued acute OT to address the below listed deficits and maximize independence with basic ADLs prior to d/c home. PTA pt was independent with ADLs. Pt is currently setup to supervision with ADLs. Pt reporting cramping in B hands and feet, nursing aware.      Follow Up Recommendations  No OT follow up    Equipment Recommendations  None recommended by OT    Recommendations for Other Services       Precautions / Restrictions Precautions Precautions: None Restrictions Weight Bearing Restrictions: No      Mobility Bed Mobility Overal bed mobility: Independent             General bed mobility comments: pt able to get in and out of bed without assist  Transfers Overall transfer level: Modified independent Equipment used: None             General transfer comment: pt stood safely without support    Balance Overall balance assessment: Mild deficits observed, not formally tested                                         ADL either performed or assessed with clinical judgement   ADL Overall ADL's : Needs assistance/impaired Eating/Feeding: Set up;Sitting   Grooming: Supervision/safety;Oral care;Standing   Upper Body Bathing: Set up;Sitting   Lower Body Bathing: Supervison/ safety;Sit to/from stand   Upper Body Dressing : Set up;Sitting   Lower Body Dressing:  Supervision/safety;Sit to/from stand   Toilet Transfer: Supervision/safety;Ambulation   Toileting- Clothing Manipulation and Hygiene: Supervision/safety;Sit to/from Nurse, children's Details (indicate cue type and reason): sponge bath at baseline Functional mobility during ADLs: Supervision/safety General ADL Comments: Pt completed mobility in the room, navigating obstacles and turns, and stood to complete oral care at sink. All at supervision level.     Vision         Perception     Praxis      Pertinent Vitals/Pain Pain Assessment: Faces Faces Pain Scale: Hurts little more Pain Location: cramping pain in B hands and feet Pain Descriptors / Indicators: Cramping Pain Intervention(s): Limited activity within patient's tolerance;Monitored during session;Repositioned     Hand Dominance Right   Extremity/Trunk Assessment Upper Extremity Assessment Upper Extremity Assessment: RUE deficits/detail;LUE deficits/detail RUE Deficits / Details: pt reporting cramping in B hands and feet. Able to complete full AROM of hands. LUE Deficits / Details: pt reporting cramping in B hands and feet. Able to complete full AROM of hands   Lower Extremity Assessment Lower Extremity Assessment: Defer to PT evaluation   Cervical / Trunk Assessment Cervical / Trunk Assessment: Normal   Communication Communication Communication: No difficulties   Cognition Arousal/Alertness: Awake/alert Behavior During Therapy: WFL for tasks assessed/performed Overall Cognitive Status: Within Functional Limits for tasks assessed  General Comments       Exercises     Shoulder Instructions      Home Living Family/patient expects to be discharged to:: Private residence Living Arrangements: Non-relatives/Friends Available Help at Discharge: Available 24 hours/day Type of Home: House Home Access: Stairs to enter [employer] of  Steps: 2 Entrance Stairs-Rails: Right;Left;Can reach both Home Layout: One level     Bathroom Shower/Tub: Tub/shower unit;Door   [employer] Toilet: Standard Bathroom Accessibility: Yes How Accessible: Accessible via walker Home Equipment: Walker - 4 wheels          Prior Functioning/Environment Level of Independence: Independent        Comments: does not drive; does her finances and medication management; SCAT to dialysis or friends take her        OT Problem List: Decreased activity tolerance;Impaired balance (sitting and/or standing);Decreased knowledge of use of DME or AE;Decreased knowledge of precautions;Pain      OT Treatment/Interventions: Self-care/ADL training;Therapeutic exercise;Energy conservation;DME and/or AE instruction;Therapeutic activities;Patient/family education;Balance training    OT Goals(Current goals can be found in the care plan section) Acute Rehab OT Goals Patient Stated Goal: not stated OT Goal Formulation: With patient Time For Goal Achievement: 10/08/18 Potential to Achieve Goals: Good ADL Goals Pt Will Perform Grooming: Independently;standing Pt Will Perform Upper Body Bathing: Independently;sitting Pt Will Perform Lower Body Bathing: Independently;sit to/from stand  OT Frequency: Min 2X/week   Barriers to D/C:            Co-evaluation              AM-PAC OT "6 Clicks" Daily Activity     Outcome Measure Help from another person eating meals?: None Help from another person taking care of personal grooming?: None Help from another person toileting, which includes using toliet, bedpan, or urinal?: None Help from another person bathing (including washing, rinsing, drying)?: None Help from another person to put on and taking off regular upper body clothing?: None Help from another person to put on and taking off regular lower body clothing?: None 6 Click Score: 24   End of Session Nurse Communication: Mobility status  Activity  Tolerance: Patient tolerated treatment well Patient left: in chair;with call bell/phone within reach;with chair alarm set  OT Visit Diagnosis: Unsteadiness on feet (R26.81)                Time: 5053-9767 OT Time Calculation (min): 18 min Charges:  OT General Charges $OT Visit: 1 Visit OT Evaluation $OT Eval Low Complexity: [place], OT Acute Rehabilitation Services Pager: [phone] Office: [phone]   Hortencia Pilar 09/24/2018, 11:20 AM

## 2018-09-24 NOTE — Progress Notes (Signed)
Left arm fistula duplex still pending after 1st stage brachiobasilic fistula that was placed 07/21/18.  Patient missed follow-up with me last month.  Fistula with good thrill.  Discussed tentative second stage on Monday pending duplex.  Marty Heck, MD [facility] Office: [phone] Pager: [place]

## 2018-09-24 NOTE — Progress Notes (Signed)
   Subjective: [name] was seen and evaluated on morning rounds. She was sitting up comfortably in her chair and feeling much better today. Her headache has resolved. Denies any chest pain, shortness of breath, abdominal pain, nausea or vomiting. She endorses severe muscle cramps beginning last night which we explained can happen while getting used to dialysis again.   Objective:  Vital signs in last 24 hours: Vitals:   09/23/18 1655 09/23/18 2210 09/24/18 0522 09/24/18 1028  BP: (!) 142/55 (!) 136/92 129/74 121/73  Pulse: 93 79 92 88  Resp: 16 18 18 16   Temp: 98.1 F (36.7 C) 98.6 F (37 C) 97.8 F (36.6 C) 98.6 F (37 C)  TempSrc: Oral Oral  Oral  SpO2: 96% 100% 100% 100%  Weight: 59.4 kg 59.4 kg    Height:       General: awake, alert, sitting up in her chair in NAD CV: RRR; no murmurs, rubs or gallops Pulm: normal respiratory effort; lungs CTA bilaterally Abd: BS+; abdomen soft, non-distended, non-tender Ext: no lower extremity edema  Assessment/Plan:  Principal Problem:   Hyperkalemia Active Problems:   ESRD (end stage renal disease) (HCC)   Microcytic anemia   AKI (acute kidney injury) ([place])   Hypertension   Abdominal pain   Type 2 diabetes mellitus ([place])   Fungemia  Rhonda Lynch is a 63 yo female with a hx significant for 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     ESRD on Dialysis 2/2 ANCA Vasculitis    Hyperkalemia - potassium has normalized  - uremic symptoms significantly improved with serial HD sessions the last 2 days; appreciate nephrology following; plan for another HD session tomorrow - she is euvolemic on exam  - CLIP process has been started to set up outpatient dialysis  - vascular on board for  evaluation of fistula; duplex ordered and if matured, will proceed with 2nd stage BVT -   HTN - blood pressure has improved with HD and restarting home Amlodipine and Hydralazine - headache has resolved which may have been combination of uremia and HTN  Type 2 DM - CBGs well controlled on Lantus 5 and SSI   Anemia of CKD - iron studies are on low end or normal; she has been started on ferric gluconate per nephro  Hep C + antibody - RNA still pending   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   Modena Nunnery D, DO 09/24/2018, 10:42 AM Pager: [phone]

## 2018-09-24 NOTE — Progress Notes (Signed)
Patient c/o severe cramping of both hands and feet,MD on call notified via text page. Order received. Will continue to monitor. Andie Mortimer, Wonda Cheng, Therapist, sports

## 2018-09-24 NOTE — Progress Notes (Addendum)
[facility] Progress Note   Subjective:   Patient seen and examined at bedside.  Feeling better today.  Denies SOB, CP, and n/v/d.  Reports dialysis went well but severe cramping after HD.  Improvement in LUE edema. Says is still cramping  Objective Vitals:   09/23/18 1630 09/23/18 1655 09/23/18 2210 09/24/18 0522  BP: 119/72 (!) 142/55 (!) 136/92 129/74  Pulse: 87 93 79 92  Resp:  16 18 18   Temp:  98.1 F (36.7 C) 98.6 F (37 C) 97.8 F (36.6 C)  TempSrc:  Oral Oral   SpO2:  96% 100% 100%  Weight:  59.4 kg 59.4 kg   Height:       Physical Exam General:NAD, pleasant female, sitting in bed Heart:RRR, no mrg Lungs:CTAB Abdomen:soft, NTND Extremities:no LE edema, LUE trace edema Dialysis Access: R IJ TDC, LU AVF maturing +b   Filed Weights   09/23/18 1320 09/23/18 1655 09/23/18 2210  Weight: 61.3 kg 59.4 kg 59.4 kg    Intake/Output Summary (Last 24 hours) at 09/24/2018 0845 Last data filed at 09/24/2018 0600 Gross per 24 hour  Intake 60 ml  Output 2003 ml  Net -1943 ml    Additional Objective Labs: Basic Metabolic Panel: Recent Labs  Lab 09/22/18 2202 09/23/18 0550 09/23/18 1329 09/23/18 2113  NA 136 139  --  135  K 3.4* 3.7  --  4.0  CL 104 106  --  99  CO2 24 24  --  25  GLUCOSE 159* 104*  --  129*  BUN 20 23  --  9  CREATININE 3.94* 4.51*  --  2.43*  CALCIUM 8.3* 8.4* 8.3* 8.5*  PHOS  --  3.8  --  2.8   Liver Function Tests: Recent Labs  Lab 09/22/18 1317 09/23/18 0550 09/23/18 2113  AST 87*  --   --   ALT 31  --   --   ALKPHOS 49  --   --   BILITOT 2.4*  --   --   PROT 7.1  --   --   ALBUMIN 2.6* 2.5* 2.8*   CBC: Recent Labs  Lab 09/22/18 1317 09/23/18 0550 09/23/18 2113 09/24/18 0356  WBC 7.0 7.6 10.3 8.7  NEUTROABS 4.4  --   --   --   HGB 9.5* 8.9* 10.1* 9.8*  HCT 33.4* 29.2* 32.6* 32.0*  MCV 74.4* 70.0* 70.6* 70.0*  PLT 173 153 154 155   Blood Culture    Component Value Date/Time   SDES BLOOD RIGHT  ANTECUBITAL 09/22/2018 2023   SDES BLOOD RIGHT HAND 09/22/2018 2023   SPECREQUEST  09/22/2018 2023    BOTTLES DRAWN AEROBIC ONLY Blood Culture results may not be optimal due to an excessive volume of blood received in culture bottles   SPECREQUEST  09/22/2018 2023    BOTTLES DRAWN AEROBIC ONLY Blood Culture adequate volume   CULT  09/22/2018 2023    NO GROWTH < 24 HOURS Performed at [facility], [address], [place], [place] [address]    CULT  09/22/2018 2023    NO GROWTH < 24 HOURS Performed at [facility], [place] [address] [place], [place] [address]    REPTSTATUS PENDING 09/22/2018 2023   REPTSTATUS PENDING 09/22/2018 2023    Cardiac Enzymes: Recent Labs  Lab 09/22/18 2202 09/23/18 0550  TROPONINI 0.16* 0.16*   CBG: Recent Labs  Lab 09/23/18 0805 09/23/18 1100 09/23/18 1745 09/23/18 2209 09/24/18 0744  GLUCAP 155* 104* 110*  128* 91   Iron Studies:  Recent Labs    09/23/18 2113  IRON 42  TIBC 291  FERRITIN 573*   Lab Results  Component Value Date   INR 0.97 07/23/2018   INR 0.98 07/22/2018   INR 0.95 07/21/2018   Studies/Results: Dg Chest 2 View  Result Date: 09/22/2018 CLINICAL DATA:  Shortness of breath and mild chest pain. Post dialysis today. EXAM: CHEST - 2 VIEW COMPARISON:  08/11/2018 FINDINGS: Dialysis catheter in stable position, terminating in the cavoatrial junction. Mildly enlarged cardiac silhouette. Mediastinal contours appear intact. There is no evidence of focal airspace consolidation, pleural effusion or pneumothorax. Osseous structures are without acute abnormality. Soft tissues are grossly normal. IMPRESSION: Mildly enlarged cardiac silhouette. No evidence of pulmonary edema or consolidation. Electronically Signed   By: Fidela Salisbury M.D.   On: 09/22/2018 19:53   Ct Head Wo Contrast  Result Date: 09/22/2018 CLINICAL DATA:  Left arm swelling. Symptoms for 1 month. Waiting for dialysis fistula to be placed in the left arm.  Headaches for several days. EXAM: CT HEAD WITHOUT CONTRAST TECHNIQUE: Contiguous axial images were obtained from the base of the skull through the vertex without intravenous contrast. COMPARISON:  08/11/2018 FINDINGS: Brain: No evidence of acute infarction, hemorrhage, hydrocephalus, extra-axial collection or mass lesion/mass effect. Previously seen left medial temporal lobe focal hemorrhage is not seen today consistent with interval resolution. Vascular: Mild intracranial arterial calcifications. Skull: Calvarium appears intact. No acute depressed skull fractures. Sinuses/Orbits: Mucosal thickening in the paranasal sinuses. No acute air-fluid levels. Mastoid air cells are clear. Other: None. IMPRESSION: No acute intracranial abnormalities. Electronically Signed   By: Lucienne Capers M.D.   On: 09/22/2018 20:22    Medications: . sodium chloride    . sodium chloride     . amLODipine  10 mg Oral Daily  . calcitRIOL  0.25 mcg Oral Q T,Th,Sa-HD  . Chlorhexidine Gluconate Cloth  6 each Topical Q0600  . diphenhydrAMINE  25 mg Intravenous Once  . feeding supplement (PRO-STAT SUGAR FREE 64)  30 mL Oral BID  . fluconazole  200 mg Oral Daily  . heparin injection (subcutaneous)  5,000 Units Subcutaneous Q8H  . hydrALAZINE  25 mg Oral Q8H  . insulin aspart  0-5 Units Subcutaneous QHS  . insulin aspart  0-9 Units Subcutaneous TID WC  . insulin glargine  5 Units Subcutaneous QHS  . multivitamin  1 tablet Oral QHS  . patiromer  16.8 g Oral Daily  . prochlorperazine  10 mg Intravenous Once  . ramelteon  8 mg Oral QHS  . sodium chloride flush  3 mL Intravenous Q12H    Dialysis Orders: **She does NOT have an active HD unit, will need to undergo CLIP process again** Prev dialyzed at [facility] (4hr, EDW 59kg, 2K/2.25Ca, TDC, no heparin)  Assessment/Plan: 1. Hyperkalemia: d/t missed HD x 2 months. Resolved. 2. Uremia - due to missed HD, last on 07/22/18.  Labs improved post HD.  Symptoms improving.  3. ESRD -  Serial HD 1/13, 1/14.  CLIP started, doubtful previous clinic will accept her back due to non compliance.  Next HD tomorrow, orders written. 4. Anemia of CKD- Hgb 9.8. Low Tsat, start Fe load. 5. Secondary hyperparathyroidism - CCa and phos at goal. Follow trends.  6. HTN/volume - BP in goal, appears euvolemic on exam. Post weight ~ 59.4kg. 7. Nutrition - Alb 2.5, Renal diet w/fluid restrictions. Protein supplements, vitamins.  8. 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  9. Dialysis access - L IJ TDC, BC NGTD.  S/p L stage 1 BVT 11/11.  Appreciate VVS consult.  Duplex ordered & they will schedule 2nd stage if matured. 10. Hep C positive 11. 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 Per primary  Jen Mow, PA-C [facility] Pager: [phone] 09/24/2018,8:45 AM  LOS: 2 days   Patient seen and examined, agree with above note with above modifications. Feels better except for cramping- understands that dialysis is unfortunately something that she needs- plan for HD tomorrow- maybe have reached her EDW- on iron - hopefully will get second stage of BVT while here in hospital - may be able to decrease her BP meds- will actually stop hydralazine and follow   [name], MD 09/24/2018

## 2018-09-25 ENCOUNTER — Inpatient Hospital Stay (HOSPITAL_COMMUNITY): Payer: Medicare Other

## 2018-09-25 DIAGNOSIS — N186 End stage renal disease: Secondary | ICD-10-CM

## 2018-09-25 LAB — RENAL FUNCTION PANEL
Albumin: 2.5 g/dL — ABNORMAL LOW (ref 3.5–5.0)
Anion gap: 12 (ref 5–15)
BUN: 35 mg/dL — ABNORMAL HIGH (ref 8–23)
CHLORIDE: 98 mmol/L (ref 98–111)
CO2: 23 mmol/L (ref 22–32)
CREATININE: 5.53 mg/dL — AB (ref 0.44–1.00)
Calcium: 8.5 mg/dL — ABNORMAL LOW (ref 8.9–10.3)
GFR calc Af Amer: 9 mL/min — ABNORMAL LOW (ref >60)
GFR, EST NON AFRICAN AMERICAN: 8 mL/min — AB (ref >60)
Glucose, Bld: 92 mg/dL (ref 70–99)
Phosphorus: 5.3 mg/dL — ABNORMAL HIGH (ref 2.5–4.6)
Potassium: 3.6 mmol/L (ref 3.5–5.1)
Sodium: 133 mmol/L — ABNORMAL LOW (ref 135–145)

## 2018-09-25 LAB — CBC
HCT: 29.2 % — ABNORMAL LOW (ref 36.0–46.0)
Hemoglobin: 9 g/dL — ABNORMAL LOW (ref 12.0–15.0)
MCH: 22.1 pg — ABNORMAL LOW (ref 26.0–34.0)
MCHC: 30.8 g/dL (ref 30.0–36.0)
MCV: 71.6 fL — ABNORMAL LOW (ref 80.0–100.0)
Platelets: 159 10*3/uL (ref 150–400)
RBC: 4.08 MIL/uL (ref 3.87–5.11)
RDW: 22.5 % — ABNORMAL HIGH (ref 11.5–15.5)
WBC: 7.1 10*3/uL (ref 4.0–10.5)
nRBC: 0 % (ref 0.0–0.2)

## 2018-09-25 LAB — PARATHYROID HORMONE, INTACT (NO CA): PTH: 211 pg/mL — ABNORMAL HIGH (ref 15–65)

## 2018-09-25 LAB — GLUCOSE, CAPILLARY
GLUCOSE-CAPILLARY: 83 mg/dL (ref 70–99)
Glucose-Capillary: 108 mg/dL — ABNORMAL HIGH (ref 70–99)
Glucose-Capillary: 146 mg/dL — ABNORMAL HIGH (ref 70–99)

## 2018-09-25 MED ORDER — HEPARIN SODIUM (PORCINE) 1000 UNIT/ML IJ SOLN
INTRAMUSCULAR | Status: AC
  Administered 2018-09-25: 1000 [IU] via INTRAVENOUS_CENTRAL
  Filled 2018-09-25: qty 4

## 2018-09-25 MED ORDER — PROCHLORPERAZINE MALEATE 10 MG PO TABS
10.0000 mg | ORAL_TABLET | Freq: Four times a day (QID) | ORAL | Status: DC | PRN
  Administered 2018-09-25 – 2018-10-11 (×5): 10 mg via ORAL
  Filled 2018-09-25 (×7): qty 1

## 2018-09-25 NOTE — Progress Notes (Addendum)
Hemodialysis- possible symptomatic hypotension. Patient with hot flashes, dizziness and nausea. UF off and 250cc saline given. Patient states relief. Unsure to what bp dropped to due to immediate initiation of saline. Currently stable. Continue to monitor.

## 2018-09-25 NOTE — Progress Notes (Signed)
   Subjective: Rhonda Lynch is feeling a lot better today. I saw her after dialysis and she stated that her abdominal pain and cramps have resolved completely. She stated that she only has a slight headache but it is drastically decreased since she started dialysis. She has a history of headaches and was on medication for it before but stated that she had not had headaches for a while prior to her recent stroke. She confirms the compazine is working. She also states that her appetite is coming back and she has started slowly eating again.    Objective:  Vital signs in last 24 hours: Vitals:   09/25/18 1030 09/25/18 1100 09/25/18 1111 09/25/18 1142  BP: 140/71 (!) 143/83 (!) 144/89 122/68  Pulse: 89 88 81 92  Resp: 16 15 12 18   Temp:  97.9 F (36.6 C)  98.5 F (36.9 C)  TempSrc:  Oral  Oral  SpO2:  100%  100%  Weight:  59.7 kg    Height:       General: awake, alert, sitting up in her chair in NAD CV: RRR; no murmurs, rubs or gallops Pulm: normal respiratory effort; lungs CTA bilaterally Ext: no lower extremity edema  Assessment/Plan:  Principal Problem:   Hyperkalemia Active Problems:   ESRD (end stage renal disease) (HCC)   Microcytic anemia   AKI (acute kidney injury) ([place])   Hypertension   Abdominal pain   Type 2 diabetes mellitus ([place])   Fungemia  Rhonda Lynch is a 63 yo female with a hx significant for 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 Hyperkalemia now resoled. Undergoing CLIP process now.     ESRD on Dialysis 2/2 ANCA Vasculitis    Hyperkalemia - hyperkalemia resolved - uremic symptoms significantly improved with serial HD sessions the last 2 days; appreciate nephrology following; Received HD today - CLIP process  has been started to set up outpatient dialysis  - vascular on board for evaluation of fistula; duplex ordered and if matured, will proceed with 2nd stage BVT possibly Monday?  HTN - blood pressure has improved with HD and restarting home Amlodipine 10. 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   Synetta Shadow, Medical Student 09/25/2018, 7:24 AM Pager: [phone]  Attestation for Student Documentation:  I personally was present and performed or re-performed the history, physical exam and medical decision-making activities of this service and have verified that the service and findings are accurately documented in the student's note.  Modena Nunnery D, DO 09/25/2018, 3:05 PM

## 2018-09-25 NOTE — Procedures (Signed)
Patient was seen on dialysis and the procedure was supervised.  BFR 350  Via TDC BP is  116/60.   Patient appears to be tolerating treatment well  Louis Meckel 09/25/2018

## 2018-09-25 NOTE — Progress Notes (Signed)
[facility] Progress Note   Subjective:   Patient seen on HD- c/o heaadache- had compazine last night which helped- cramps gone- not needing UF with HD today- makes urine   Objective Vitals:   09/25/18 0916 09/25/18 0918 09/25/18 0930 09/25/18 1000  BP: (!) 98/54 139/71 (!) 141/83 139/76  Pulse:  98 92 90  Resp:  16 16 15   Temp:      TempSrc:      SpO2:      Weight:      Height:       Physical Exam General:NAD, pleasant female, sitting in bed Heart:RRR, no mrg Lungs:CTAB Abdomen:soft, NTND Extremities:no LE edema, LUE trace edema Dialysis Access: R IJ TDC, LU AVF maturing +b   Filed Weights   09/23/18 2210 09/25/18 0418 09/25/18 0719  Weight: 59.4 kg 58.3 kg 59.1 kg   No intake or output data in the 24 hours ending 09/25/18 1021  Additional Objective Labs: Basic Metabolic Panel: Recent Labs  Lab 09/23/18 0550 09/23/18 1329 09/23/18 2113 09/25/18 0720  NA 139  --  135 133*  K 3.7  --  4.0 3.6  CL 106  --  99 98  CO2 24  --  25 23  GLUCOSE 104*  --  129* 92  BUN 23  --  9 35*  CREATININE 4.51*  --  2.43* 5.53*  CALCIUM 8.4* 8.3* 8.5* 8.5*  PHOS 3.8  --  2.8 5.3*   Liver Function Tests: Recent Labs  Lab 09/22/18 1317 09/23/18 0550 09/23/18 2113 09/25/18 0720  AST 87*  --   --   --   ALT 31  --   --   --   ALKPHOS 49  --   --   --   BILITOT 2.4*  --   --   --   PROT 7.1  --   --   --   ALBUMIN 2.6* 2.5* 2.8* 2.5*   CBC: Recent Labs  Lab 09/22/18 1317 09/23/18 0550 09/23/18 2113 09/24/18 0356 09/25/18 0720  WBC 7.0 7.6 10.3 8.7 7.1  NEUTROABS 4.4  --   --   --   --   HGB 9.5* 8.9* 10.1* 9.8* 9.0*  HCT 33.4* 29.2* 32.6* 32.0* 29.2*  MCV 74.4* 70.0* 70.6* 70.0* 71.6*  PLT 173 153 154 155 159   Blood Culture    Component Value Date/Time   SDES BLOOD RIGHT ANTECUBITAL 09/22/2018 2023   SDES BLOOD RIGHT HAND 09/22/2018 2023   SPECREQUEST  09/22/2018 2023    BOTTLES DRAWN AEROBIC ONLY Blood Culture results may not be optimal  due to an excessive volume of blood received in culture bottles   SPECREQUEST  09/22/2018 2023    BOTTLES DRAWN AEROBIC ONLY Blood Culture adequate volume   CULT  09/22/2018 2023    NO GROWTH 3 DAYS Performed at [facility], [place] [address], [place], [place] [address]    CULT  09/22/2018 2023    NO GROWTH 3 DAYS Performed at [place] [address], [place], [place] [address]    REPTSTATUS PENDING 09/22/2018 2023   REPTSTATUS PENDING 09/22/2018 2023    Cardiac Enzymes: Recent Labs  Lab 09/22/18 2202 09/23/18 0550  TROPONINI 0.16* 0.16*   CBG: Recent Labs  Lab 09/23/18 2209 09/24/18 0744 09/24/18 1205 09/24/18 1640 09/24/18 2145  GLUCAP 128* 91 174* 96 168*   Iron Studies:  Recent Labs    09/23/18 2113  IRON 42  TIBC 291  FERRITIN 573*   Lab Results  Component Value Date   INR 0.97 07/23/2018   INR 0.98 07/22/2018   INR 0.95 07/21/2018   Studies/Results: No results found.  Medications: . sodium chloride    . sodium chloride    . ferric gluconate (FERRLECIT/NULECIT) IV 125 mg (09/25/18 1011)   . amLODipine  10 mg Oral Daily  . calcitRIOL  0.25 mcg Oral Q T,Th,Sa-HD  . Chlorhexidine Gluconate Cloth  6 each Topical Q0600  . Chlorhexidine Gluconate Cloth  6 each Topical Q0600  . diphenhydrAMINE  25 mg Intravenous Once  . feeding supplement (PRO-STAT SUGAR FREE 64)  30 mL Oral BID  . fluconazole  200 mg Oral Daily  . heparin      . heparin injection (subcutaneous)  5,000 Units Subcutaneous Q8H  . insulin aspart  0-5 Units Subcutaneous QHS  . insulin aspart  0-9 Units Subcutaneous TID WC  . insulin glargine  5 Units Subcutaneous QHS  . multivitamin  1 tablet Oral QHS  . ramelteon  8 mg Oral QHS  . sodium chloride flush  3 mL Intravenous Q12H    Dialysis Orders: **She does NOT have an active HD unit, will need to undergo CLIP process again** Prev dialyzed at [facility] (4hr, EDW 59kg, 2K/2.25Ca, TDC, no heparin)  Assessment/Plan: 1.  Hyperkalemia: d/t missed HD x 2 months. Resolved. 2. Uremia - due to missed HD, last on 07/22/18.  Labs improved post HD.  Symptoms improving.  3. ESRD - HD 1/13, 1/14.  CLIP started, doubtful previous clinic will accept her back due to non compliance.  Now running TTS here 4. Anemia of CKD- Hgb 9.8, now 9.0. Low Tsat, started Fe load. 5. Secondary hyperparathyroidism - CCa and phos at goal, PTH 211 on low dose calcitriol. Follow trends.  6. HTN/volume - BP in goal, appears euvolemic on exam. Post weight ~ 59.4kg. Norvasc 10 mg daily- no change for now 7. Nutrition - Alb 2.5, Renal diet w/fluid restrictions. Protein supplements, vitamins. Think because of uremia 8. 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  9. Dialysis access - L IJ TDC, BC NGTD.  S/p L stage 1 BVT 11/11.  Appreciate VVS consult.  Duplex ordered & they will schedule 2nd stage if matured. Maybe Monday ?? 10. Hep C positive 11. 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 Per primary  [name], MD 09/25/2018

## 2018-09-25 NOTE — Progress Notes (Signed)
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    PT Comments    Based on chart, pt has improved  In stability, balance and stamina.  Pt likely close to baseline function   Follow Up Recommendations  No PT follow up     Equipment Recommendations  None recommended by PT    Recommendations for Other Services       Precautions / Restrictions Restrictions Weight Bearing Restrictions: No    Mobility  Bed Mobility Overal bed mobility: Independent                Transfers Overall transfer level: Modified independent               General transfer comment: steady and safe  Ambulation/Gait Ambulation/Gait assistance: Supervision;Independent Gait Distance (Feet): 600 Feet Assistive device: None Gait Pattern/deviations: Step-through pattern;WFL(Within Functional Limits)   Gait velocity interpretation: >2.62 ft/sec, indicative of community ambulatory General Gait Details: much more normalized gait.  Pt likely close to baseline.  Pt able to scan, change/increase speed significantly and change direction/back up, and step over obstacles without deviation   Stairs             Wheelchair Mobility    Modified Rankin (Stroke Patients Only)       Balance Overall balance assessment: Independent                                          Cognition Arousal/Alertness: Awake/alert Behavior During Therapy: WFL for tasks assessed/performed Overall Cognitive Status: Within Functional Limits for tasks assessed                                         Exercises      General Comments        Pertinent Vitals/Pain Pain Assessment: Faces Faces Pain Scale: No hurt    Home Living                      Prior Function            PT Goals (current goals can now be found in the care plan section) Acute Rehab PT Goals Patient Stated Goal: not stated PT Goal Formulation: With patient Time For Goal Achievement: 10/07/18 Potential to Achieve Goals: Good Progress towards PT goals: Progressing toward goals    Frequency    Min 3X/week      PT Plan Current plan remains appropriate    Co-evaluation              AM-PAC PT "6 Clicks" Mobility   Outcome Measure  Help needed turning from your back to your side while in a flat bed without using bedrails?: None Help needed moving from lying on your back to sitting on the side of a flat bed without using bedrails?: None Help needed moving to and from a bed to a chair (including a wheelchair)?: None Help  needed standing up from a chair using your arms (e.g., wheelchair or bedside chair)?: None Help needed to walk in hospital room?: A Little Help needed climbing 3-5 steps with a railing? : A Little 6 Click Score: 22    End of Session   Activity Tolerance: Patient tolerated treatment well Patient left: in bed;with call bell/phone within reach;with family/visitor present Nurse Communication: Mobility status PT Visit Diagnosis: Muscle weakness (generalized) (M62.81)     Time: 1550-1600 PT Time Calculation (min) (ACUTE ONLY): 10 min  Charges:  $Gait Training: 8-22 mins                     09/25/2018  Donnella Sham, PT Acute Rehabilitation Services [phone]  (pager) [phone]  (office)   [name] 09/25/2018, 4:06 PM

## 2018-09-25 NOTE — Progress Notes (Signed)
VASCULAR LAB PRELIMINARY  PRELIMINARY  PRELIMINARY  PRELIMINARY  Duplex of Basilic vein trasnposition completed.    Preliminary report: See in CV Proc  Damont Balles, RVT 09/25/2018, 1:57 PM

## 2018-09-25 NOTE — Progress Notes (Signed)
Attempted duplex of dialysis access however patient is in hemodialysis, will attempt again when patient is available.   Rhonda Lynch 09/25/2018 9:38 AM

## 2018-09-26 LAB — RENAL FUNCTION PANEL
ALBUMIN: 2.6 g/dL — AB (ref 3.5–5.0)
Anion gap: 10 (ref 5–15)
BUN: 18 mg/dL (ref 8–23)
CO2: 25 mmol/L (ref 22–32)
Calcium: 8.5 mg/dL — ABNORMAL LOW (ref 8.9–10.3)
Chloride: 99 mmol/L (ref 98–111)
Creatinine, Ser: 3.58 mg/dL — ABNORMAL HIGH (ref 0.44–1.00)
GFR calc non Af Amer: 13 mL/min — ABNORMAL LOW (ref >60)
GFR, EST AFRICAN AMERICAN: 15 mL/min — AB (ref >60)
Glucose, Bld: 85 mg/dL (ref 70–99)
PHOSPHORUS: 3.8 mg/dL (ref 2.5–4.6)
Potassium: 3.9 mmol/L (ref 3.5–5.1)
Sodium: 134 mmol/L — ABNORMAL LOW (ref 135–145)

## 2018-09-26 LAB — GLUCOSE, CAPILLARY
GLUCOSE-CAPILLARY: 79 mg/dL (ref 70–99)
Glucose-Capillary: 174 mg/dL — ABNORMAL HIGH (ref 70–99)
Glucose-Capillary: 99 mg/dL (ref 70–99)
Glucose-Capillary: 88 mg/dL (ref 70–99)

## 2018-09-26 MED ORDER — CEFAZOLIN SODIUM-DEXTROSE 1-4 GM/50ML-% IV SOLN
1.0000 g | INTRAVENOUS | Status: AC
  Administered 2018-09-29: 1 g via INTRAVENOUS
  Filled 2018-09-26 (×2): qty 50

## 2018-09-26 MED ORDER — CHLORHEXIDINE GLUCONATE CLOTH 2 % EX PADS
6.0000 | MEDICATED_PAD | Freq: Every day | CUTANEOUS | Status: DC
  Administered 2018-09-26 – 2018-09-29 (×4): 6 via TOPICAL

## 2018-09-26 MED ORDER — DARBEPOETIN ALFA 40 MCG/0.4ML IJ SOSY
40.0000 ug | PREFILLED_SYRINGE | INTRAMUSCULAR | Status: DC
  Filled 2018-09-26: qty 0.4

## 2018-09-26 MED ORDER — ZOLPIDEM TARTRATE 5 MG PO TABS
5.0000 mg | ORAL_TABLET | Freq: Every day | ORAL | Status: DC
  Administered 2018-09-26 – 2018-10-02 (×7): 5 mg via ORAL
  Filled 2018-09-26 (×8): qty 1

## 2018-09-26 MED ORDER — AMLODIPINE BESYLATE 10 MG PO TABS
10.0000 mg | ORAL_TABLET | Freq: Every day | ORAL | Status: DC
  Administered 2018-09-27 – 2018-09-30 (×4): 10 mg via ORAL
  Filled 2018-09-26 (×4): qty 1

## 2018-09-26 NOTE — Progress Notes (Signed)
Physical Therapy Treatment Patient Details Name: Rhonda Lynch MRN: [id number] DOB: February 23, 1956 Today's Date: 09/26/2018    History of Present Illness Ms. 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    PT Comments    Pt is contributing another good effort today with gait on stairs and hallway, noting her safe mobility and ability to navigate the hall without loss of balance.  Her plan continues to be home with no follow up, and she is encouraged to have nursing assist her with some additional walks to contribute to her efforts with rehab.  Follow acutely for this plan.   Follow Up Recommendations  No PT follow up     Equipment Recommendations  None recommended by PT    Recommendations for Other Services       Precautions / Restrictions Precautions Precautions: Other (comment)(monitor O2 with mobility) Restrictions Weight Bearing Restrictions: No    Mobility  Bed Mobility Overal bed mobility: Independent                Transfers Overall transfer level: Modified independent               General transfer comment: steady and safe  Ambulation/Gait Ambulation/Gait assistance: Supervision Gait Distance (Feet): 400 Feet Assistive device: None Gait Pattern/deviations: Step-through pattern;WFL(Within Functional Limits);Decreased stride length Gait velocity: decreased Gait velocity interpretation: >2.62 ft/sec, indicative of community ambulatory General Gait Details: pt is able to walk on stairs and hallway with no LOB   Stairs Stairs: Yes Stairs assistance: Min guard Stair Management: Two rails;Alternating pattern;Forwards Number of Stairs: 3     Wheelchair Mobility    Modified Rankin (Stroke  Patients Only)       Balance Overall balance assessment: Independent                                          Cognition Arousal/Alertness: Awake/alert Behavior During Therapy: WFL for tasks assessed/performed Overall Cognitive Status: Within Functional Limits for tasks assessed                                        Exercises      General Comments General comments (skin integrity, edema, etc.): Pt is using gait belt with no support, used for safety but was able to walk with no LOB      Pertinent Vitals/Pain Pain Assessment: Faces Faces Pain Scale: No hurt    Home Living                      Prior Function            PT Goals (current goals can now be found in the care plan section) Acute Rehab PT Goals Patient Stated Goal: ready to go home but asking about HHPT Progress towards PT goals: Progressing toward goals    Frequency    Min 3X/week      PT Plan Current plan remains appropriate    Co-evaluation              AM-PAC PT "6 Clicks" Mobility  Outcome Measure  Help needed turning from your back to your side while in a flat bed without using bedrails?: None Help needed moving from lying on your back to sitting on the side of a flat bed without using bedrails?: None Help needed moving to and from a bed to a chair (including a wheelchair)?: None Help needed standing up from a chair using your arms (e.g., wheelchair or bedside chair)?: None Help needed to walk in hospital room?: A Little Help needed climbing 3-5 steps with a railing? : A Little 6 Click Score: 22    End of Session Equipment Utilized During Treatment: Gait belt Activity Tolerance: Patient tolerated treatment well Patient left: in bed;with call bell/phone within reach(sitting on bedside) Nurse Communication: Mobility status PT Visit Diagnosis: Muscle weakness (generalized) (M62.81)     Time: 7494-4967 PT Time Calculation (min) (ACUTE  ONLY): 24 min  Charges:  $Gait Training: 8-22 mins $Therapeutic Activity: 8-22 mins                   Ramond Dial 09/26/2018, 6:11 PM  Mee Hives, PT MS Acute Rehab Dept. Number: [place] and [place]

## 2018-09-26 NOTE — Care Management Important Message (Signed)
Important Message  Patient Details  Name: Rhonda Lynch MRN: [id number] Date of Birth: August 31, 1956   Medicare Important Message Given:  Yes    Orbie Pyo 09/26/2018, 3:53 PM

## 2018-09-26 NOTE — Progress Notes (Signed)
   Subjective:  No acute events overnight. Rhonda Lynch was doing well this AM. Her only complaints are a persistent HA and inability to sleep. She was working with PT when seen.   Objective:  Vital signs in last 24 hours: Vitals:   09/25/18 1748 09/25/18 1941 09/26/18 0828 09/26/18 1632  BP: 115/74 121/73 130/84 130/73  Pulse: 98 96 90 86  Resp: 18 20 16 16   Temp: 99.4 F (37.4 C) 98.1 F (36.7 C) 97.6 F (36.4 C) 98.7 F (37.1 C)  TempSrc: Oral Oral Oral Oral  SpO2: 100% 95% 100% 100%  Weight:  59.3 kg    Height:       General: chronically ill appearing female in NAD  CV: RRR, no mrg  Pulm: normal respiratory effort Ext: warm and well perfused, no LE edema   Assessment/Plan:  Principal Problem:   Hyperkalemia Active Problems:   ESRD (end stage renal disease) (HCC)   Microcytic anemia   AKI (acute kidney injury) ([place])   Hypertension   Abdominal pain   Type 2 diabetes mellitus ([place])   Fungemia  Rhonda Lynch is a 63 yo female with a hx significant for 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 Hyperkalemia now resoled. Undergoing CLIP process now.    # ESRD on Dialysis 2/2 ANCA Vasculitis   # Hyperkalemia - uremic symptoms significantly improved with serial HD sessions the last 2 days; appreciate nephrology following; Received HD today - CLIP process has been started to set up outpatient dialysis  - vascular on board for evaluation of fistula; plan to proceed with 2nd stage BVT Monday  # HTN - Normotensive on amlodipine. Hydralazine d/c'd by nephrology.   # Type 2 DM: CBGs well controlled on Lantus 5 and SSI   # Anemia of CKD - iron studies are on low end of normal;  - cont ferric gluconate  # Hep C + antibody:  RNA level pending   # H/o  Fungemia with possible TV involvement: Fluconazole 200 mg QD indefinitely. Bcx negative x 3 days.    Dispo: Anticipated discharge pending CLIP process for HD placement.   Welford Roche, MD 09/26/2018, 5:57 PM Pager: [phone]

## 2018-09-26 NOTE — Progress Notes (Signed)
Rhonda Lynch Progress Note   Dialysis Orders: **She does NOT have an active HD unit, will need to undergo CLIP process again** Prev dialyzed at [facility] (4hr, EDW 59kg, 2K/2.25Ca, TDC, no heparin)  Assessment/Plan: 1.Hyperkalemia: d/t missed HD x 2 months. Resolved. 2. Uremia - due to missed HD, last on 07/22/18. resolved with adequate HD 3. ESRD -HD 1/13, 1/14. CLIP pending, doubtful previous clinic will accept her back due to non compliance. Now running TTS here- plans pending - HD tomorrow K 3.9 - on heparin here - not sue why no HD previously at output unit  4. Anemia of CKD-Hgb 9.8, now 9.0. Low Tsat, started Fe load.- low dose ESA  5. Secondary hyperparathyroidism -CCa and phos at goal, PTH 211 on low dose calcitriol. Follow trends.  6. HTN/volume -BP in goal, appears euvolemic on exam. BP drop during HD with symptomatic N, V, dizziness, hot flashes.   Norvasc 10 mg daily + 774 on HD Thursday with post wt 59.7- changed to HS dosing Saturday - already had gotten 10 am dose today. Set EDW 60 7. Nutrition -Alb 2.6, Renal diet w/fluid restrictions. Protein supplements, vitamins.  8. 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  9. Dialysis access - L IJ TDC, BC NGTD. S/p L stage 1 BVT 11/11. Appreciate VVS consult.  Duplex completed. VVS to decide on second stage readiness 10. Hep C positive 11. 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 Per primary [facility] 11/13 no growth pending. Low grade temp noted yesterday.   Myriam Jacobson, PA-C [place] 09/26/2018,8:51 AM  LOS: 4 days   Subjective:   No problems with HD yesterday.  Didn't sleep well last night.(does have sleeping med )  Happens at home as well.  No plan yet for outpt HD unit  Objective Vitals:   09/25/18 1142 09/25/18 1748 09/25/18 1941 09/26/18 0828  BP: 122/68 115/74 121/73 130/84  Pulse: 92 98 96 90   Resp: 18 18 20 16   Temp: 98.5 F (36.9 C) 99.4 F (37.4 C) 98.1 F (36.7 C) 97.6 F (36.4 C)  TempSrc: Oral Oral Oral Oral  SpO2: 100% 100% 95% 100%  Weight:   59.3 kg   Height:       Physical Exam General: NAD  Heart: RRR no rub Lungs: no rales Abdomen: soft NT + BS Extremities: no LE edema Dialysis Access: right IJ TDC left upper AVF + bruit   Additional Objective Labs: Basic Metabolic Panel: Recent Labs  Lab 09/23/18 2113 09/25/18 0720 09/26/18 0321  NA 135 133* 134*  K 4.0 3.6 3.9  CL 99 98 99  CO2 25 23 25   GLUCOSE 129* 92 85  BUN 9 35* 18  CREATININE 2.43* 5.53* 3.58*  CALCIUM 8.5* 8.5* 8.5*  PHOS 2.8 5.3* 3.8   Liver Function Tests: Recent Labs  Lab 09/22/18 1317  09/23/18 2113 09/25/18 0720 09/26/18 0321  AST 87*  --   --   --   --   ALT 31  --   --   --   --   ALKPHOS 49  --   --   --   --   BILITOT 2.4*  --   --   --   --   PROT 7.1  --   --   --   --   ALBUMIN 2.6*   < > 2.8* 2.5* 2.6*   < > = values in  this interval not displayed.   No results for input(s): LIPASE, AMYLASE in the last 168 hours. CBC: Recent Labs  Lab 09/22/18 1317 09/23/18 0550 09/23/18 2113 09/24/18 0356 09/25/18 0720  WBC 7.0 7.6 10.3 8.7 7.1  NEUTROABS 4.4  --   --   --   --   HGB 9.5* 8.9* 10.1* 9.8* 9.0*  HCT 33.4* 29.2* 32.6* 32.0* 29.2*  MCV 74.4* 70.0* 70.6* 70.0* 71.6*  PLT 173 153 154 155 159   Blood Culture    Component Value Date/Time   SDES BLOOD RIGHT ANTECUBITAL 09/22/2018 2023   SDES BLOOD RIGHT HAND 09/22/2018 2023   SPECREQUEST  09/22/2018 2023    BOTTLES DRAWN AEROBIC ONLY Blood Culture results may not be optimal due to an excessive volume of blood received in culture bottles   SPECREQUEST  09/22/2018 2023    BOTTLES DRAWN AEROBIC ONLY Blood Culture adequate volume   CULT  09/22/2018 2023    NO GROWTH 4 DAYS Performed at [facility], [name] [address], [place], [place] [address]    CULT  09/22/2018 2023    NO GROWTH 4  DAYS Performed at [facility], [place] [address] [place],  [address]    REPTSTATUS PENDING 09/22/2018 2023   REPTSTATUS PENDING 09/22/2018 2023    Cardiac Enzymes: Recent Labs  Lab 09/22/18 2202 09/23/18 0550  TROPONINI 0.16* 0.16*   CBG: Recent Labs  Lab 09/24/18 2145 09/25/18 1139 09/25/18 1648 09/25/18 2200 09/26/18 0733  GLUCAP 168* 83 108* 146* 79   Iron Studies:  Recent Labs    09/23/18 2113  IRON 42  TIBC 291  FERRITIN 573*   Lab Results  Component Value Date   INR 0.97 07/23/2018   INR 0.98 07/22/2018   INR 0.95 07/21/2018   Studies/Results: Vas US Duplex Dialysis Access (avf, Avg)  Result Date: 09/25/2018 DIALYSIS ACCESS Access Site: Left Upper Extremity. Access Type: Basilic vein transposition. Performing Technologist: Sharion Dove RVS  Examination Guidelines: A complete evaluation includes B-mode imaging, spectral Doppler, color Doppler, and power Doppler as needed of all accessible portions of each vessel. Unilateral testing is considered an integral part of a complete examination. Limited examinations for reoccurring indications may be performed as noted.  Findings:  +------------+----------+-------------+----------+--------+ OUTFLOW VEINPSV (cm/s)Diameter (cm)Depth (cm)Describe +------------+----------+-------------+----------+--------+ Prox UA                   0.86        2.57            +------------+----------+-------------+----------+--------+ Mid UA                    0.78        2.09            +------------+----------+-------------+----------+--------+ Dist UA                   0.59        1.91            +------------+----------+-------------+----------+--------+ Prox Forearm                          0.64            +------------+----------+-------------+----------+--------+  +--------------------+----------+-----------------+--------+ AVG                 PSV (cm/s)Flow Vol (mL/min)Describe  +--------------------+----------+-----------------+--------+ Native artery inflow   218                              +--------------------+----------+-----------------+--------+  Diagnosing physician: Servando Snare MD Electronically signed by Servando Snare MD on 09/25/2018 at 3:29:55 PM.   --------------------------------------------------------------------------------   Final    Medications: . ferric gluconate (FERRLECIT/NULECIT) IV Stopped (09/25/18 1111)   . amLODipine  10 mg Oral Daily  . calcitRIOL  0.25 mcg Oral Q T,Th,Sa-HD  . Chlorhexidine Gluconate Cloth  6 each Topical Q0600  . Chlorhexidine Gluconate Cloth  6 each Topical Q0600  . diphenhydrAMINE  25 mg Intravenous Once  . feeding supplement (PRO-STAT SUGAR FREE 64)  30 mL Oral BID  . fluconazole  200 mg Oral Daily  . heparin injection (subcutaneous)  5,000 Units Subcutaneous Q8H  . insulin aspart  0-5 Units Subcutaneous QHS  . insulin aspart  0-9 Units Subcutaneous TID WC  . insulin glargine  5 Units Subcutaneous QHS  . multivitamin  1 tablet Oral QHS  . ramelteon  8 mg Oral QHS  . sodium chloride flush  3 mL Intravenous Q12H

## 2018-09-26 NOTE — Progress Notes (Addendum)
Pt's dialysis duplex complete.  Fistula has matured nicely and will plan for 2nd stage BVT on Monday by Dr. Carlis Abbott.  If pt is discharged, she can be scheduled as outpatient for surgery Monday.  She dialyzes T/T/S.    Npo/consent placed.   OUTFLOW VEINPSV (cm/s)Diameter (cm)Depth (cm)Describe +------------+----------+-------------+----------+--------+ Prox UA                   0.86        2.57            +------------+----------+-------------+----------+--------+ Mid UA                    0.78        2.09            +------------+----------+-------------+----------+--------+ Dist UA                   0.59        1.91            +------------+----------+-------------+----------+--------+ Prox Forearm                          0.64    Rhonda Lynch, [facility] 09/26/2018 9:28 AM  I have seen and evaluated the patient. I agree with the PA note as documented above. Plan for left second stage fistula Monday.  OK for discharge from my standpoint.  Otherwise, NPO after midnight Sunday night.  Marty Heck, MD [facility] Office: [phone] Pager: [phone]

## 2018-09-27 DIAGNOSIS — E785 Hyperlipidemia, unspecified: Secondary | ICD-10-CM

## 2018-09-27 LAB — CULTURE, BLOOD (ROUTINE X 2)
Culture: NO GROWTH
Culture: NO GROWTH
Special Requests: ADEQUATE

## 2018-09-27 LAB — RENAL FUNCTION PANEL
ALBUMIN: 2.5 g/dL — AB (ref 3.5–5.0)
Albumin: 2.5 g/dL — ABNORMAL LOW (ref 3.5–5.0)
Anion gap: 12 (ref 5–15)
Anion gap: 11 (ref 5–15)
BUN: 36 mg/dL — ABNORMAL HIGH (ref 8–23)
BUN: 38 mg/dL — ABNORMAL HIGH (ref 8–23)
CALCIUM: 8.7 mg/dL — AB (ref 8.9–10.3)
CO2: 24 mmol/L (ref 22–32)
CO2: 24 mmol/L (ref 22–32)
Calcium: 8.8 mg/dL — ABNORMAL LOW (ref 8.9–10.3)
Chloride: 101 mmol/L (ref 98–111)
Chloride: 101 mmol/L (ref 98–111)
Creatinine, Ser: 5.46 mg/dL — ABNORMAL HIGH (ref 0.44–1.00)
Creatinine, Ser: 5.61 mg/dL — ABNORMAL HIGH (ref 0.44–1.00)
GFR calc Af Amer: 9 mL/min — ABNORMAL LOW (ref >60)
GFR calc Af Amer: 9 mL/min — ABNORMAL LOW (ref >60)
GFR calc non Af Amer: 8 mL/min — ABNORMAL LOW (ref >60)
GFR calc non Af Amer: 7 mL/min — ABNORMAL LOW (ref >60)
Glucose, Bld: 84 mg/dL (ref 70–99)
Glucose, Bld: 102 mg/dL — ABNORMAL HIGH (ref 70–99)
Phosphorus: 5.7 mg/dL — ABNORMAL HIGH (ref 2.5–4.6)
Phosphorus: 5.8 mg/dL — ABNORMAL HIGH (ref 2.5–4.6)
Potassium: 4.2 mmol/L (ref 3.5–5.1)
Potassium: 4.3 mmol/L (ref 3.5–5.1)
Sodium: 137 mmol/L (ref 135–145)
Sodium: 136 mmol/L (ref 135–145)

## 2018-09-27 LAB — GLUCOSE, CAPILLARY
Glucose-Capillary: 83 mg/dL (ref 70–99)
Glucose-Capillary: 104 mg/dL — ABNORMAL HIGH (ref 70–99)
Glucose-Capillary: 91 mg/dL (ref 70–99)
Glucose-Capillary: 150 mg/dL — ABNORMAL HIGH (ref 70–99)
Glucose-Capillary: 154 mg/dL — ABNORMAL HIGH (ref 70–99)
Glucose-Capillary: 100 mg/dL — ABNORMAL HIGH (ref 70–99)

## 2018-09-27 LAB — CBC
HCT: 28.8 % — ABNORMAL LOW (ref 36.0–46.0)
Hemoglobin: 8.6 g/dL — ABNORMAL LOW (ref 12.0–15.0)
MCH: 21.5 pg — ABNORMAL LOW (ref 26.0–34.0)
MCHC: 29.9 g/dL — ABNORMAL LOW (ref 30.0–36.0)
MCV: 72 fL — ABNORMAL LOW (ref 80.0–100.0)
Platelets: 153 10*3/uL (ref 150–400)
RBC: 4 MIL/uL (ref 3.87–5.11)
RDW: 22.5 % — ABNORMAL HIGH (ref 11.5–15.5)
WBC: 6.9 10*3/uL (ref 4.0–10.5)
nRBC: 0 % (ref 0.0–0.2)

## 2018-09-27 MED ORDER — HEPARIN SODIUM (PORCINE) 1000 UNIT/ML DIALYSIS
20.0000 [IU]/kg | INTRAMUSCULAR | Status: DC | PRN
  Administered 2018-09-27: 1200 [IU] via INTRAVENOUS_CENTRAL

## 2018-09-27 MED ORDER — LIDOCAINE HCL (PF) 1 % IJ SOLN: 5.0000 mL | INTRAMUSCULAR | Status: DC | PRN

## 2018-09-27 MED ORDER — ALTEPLASE 2 MG IJ SOLR
2.0000 mg | Freq: Once | INTRAMUSCULAR | Status: DC | PRN
  Filled 2018-09-27: qty 2

## 2018-09-27 MED ORDER — HEPARIN SODIUM (PORCINE) 1000 UNIT/ML DIALYSIS: 1000.0000 [IU] | INTRAMUSCULAR | Status: DC | PRN

## 2018-09-27 MED ORDER — SODIUM CHLORIDE 0.9 % IV SOLN: 100.0000 mL | INTRAVENOUS | Status: DC | PRN

## 2018-09-27 MED ORDER — DARBEPOETIN ALFA 150 MCG/0.3ML IJ SOSY: 150.0000 ug | PREFILLED_SYRINGE | INTRAMUSCULAR | Status: DC

## 2018-09-27 MED ORDER — CALCITRIOL 0.25 MCG PO CAPS
ORAL_CAPSULE | ORAL | Status: AC
  Administered 2018-09-27: 0.25 ug via ORAL
  Filled 2018-09-27: qty 1

## 2018-09-27 MED ORDER — PENTAFLUOROPROP-TETRAFLUOROETH EX AERO: 1.0000 "application " | INHALATION_SPRAY | CUTANEOUS | Status: DC | PRN

## 2018-09-27 MED ORDER — DARBEPOETIN ALFA 40 MCG/0.4ML IJ SOSY
PREFILLED_SYRINGE | INTRAMUSCULAR | Status: AC
  Administered 2018-09-27: 40 ug
  Filled 2018-09-27: qty 0.4

## 2018-09-27 MED ORDER — LIDOCAINE-PRILOCAINE 2.5-2.5 % EX CREA: 1.0000 "application " | TOPICAL_CREAM | CUTANEOUS | Status: DC | PRN

## 2018-09-27 NOTE — Progress Notes (Signed)
Medicine attending: I examined this patient today together with resident physician Dr. Isac Sarna and I concur with her evaluation and management plan.  63 year old woman with end-stage renal disease secondary to ANCA vasculitis poorly compliant with dialysis.  History of polysubstance abuse.  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  She presented on January 13 with hyperkalemia, 7.1, related to multiple missed dialysis sessions and was admitted for urgent dialysis. Condition currently stable.  She is awaiting for acceptance in a local dialysis center.  Potassium now back in normal range. She is found to be hepatitis C positive during this admission.

## 2018-09-27 NOTE — Progress Notes (Signed)
   Subjective: Ms. Hanzlik was seen and evaluated during hemodialysis. She was feeling much better since she slept well last night. Denies headache, chest pain, dyspnea, or lower extremity edema.   Objective:  Vital signs in last 24 hours: Vitals:   09/27/18 1045 09/27/18 1115 09/27/18 1140 09/27/18 1242  BP: 127/72 (!) 145/80 (!) 150/76 (!) 146/77  Pulse: 83 86 90 91  Resp:   17 20  Temp:   98.6 F (37 C) 98.8 F (37.1 C)  TempSrc:   Oral Oral  SpO2:   100% 100%  Weight:   60.6 kg   Height:       General: awake, alert, sitting up in bed in NAD CV: RRR; no murmurs, rubs or gallops Pulm: normal respiratory effort; lungs CTA  Ext: no edema   Assessment/Plan:  Principal Problem:   Hyperkalemia Active Problems:   ESRD (end stage renal disease) (HCC)   Microcytic anemia   AKI (acute kidney injury) ([place])   Hypertension   Abdominal pain   Type 2 diabetes mellitus ([place])   Fungemia  Ms. 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 Undergoing CLIP process now.   1. ESRD on Dialysis 2/2 ANCA Vasculitis     Hyperkalemia - uremia and hyperkalemia have resolved since receiving serial HD, followed by resumption of normal T/TH/S schedule - significantly improved clinically as well  - appreciate nephrology managing inpatient dialysis; undergoing CLIP process which they are hopeful will be complete Monday or Tuesday  - vascular on board and will do left second stage fistula Monday   2. HTN - blood pressures stable on amlodipine.  - Hydralazine d/c'd by nephrology  3. Type 2 DM:  - CBGs well controlled on Lantus 5 and SSI   4. Anemia of CKD - iron studies are on low end of normal;  -  cont ferric gluconate - ESA per nephrology   5. Hep C + antibody:   - RNA level pending   6. 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   Dispo: Anticipated discharge pending completion of CLIP process.   Modena Nunnery D, DO 09/27/2018, 12:47 PM Pager: [phone]

## 2018-09-27 NOTE — Procedures (Signed)
Patient was seen on dialysis and the procedure was supervised.  BFR 400  Via TDC BP is  131/82.   Patient appears to be tolerating treatment well- some cramping now better   Louis Meckel 09/27/2018

## 2018-09-27 NOTE — Progress Notes (Signed)
[facility] Progress Note   Dialysis Orders: **She does NOT have an active HD unit, will need to undergo CLIP process again** Prev dialyzed at [facility] (4hr, EDW 59kg, 2K/2.25Ca, TDC, no heparin)  Assessment/Plan: 1.Hyperkalemia: d/t missed HD. Resolved. 2. Uremia - due to missed HD- resolved with adequate HD 3. ESRD -HD 1/13, 1/14. CLIP pending, doubtful previous clinic will accept her back due to non compliance. Now running TTS here- plans pending - HD today on schedule -on heparin here - not sue why no hep previously at output unit  4. Anemia of CKD-Hgb 9.8, now 8.6. Low Tsat, started Fe load.- low dose ESA - will need to inc dose of ESA 5. Secondary hyperparathyroidism -CCa and phos at goal, PTH 211 on low dose calcitriol. Follow trends. Likely will need binder now that appetite is back  6. HTN/volume -BP in goal, appears euvolemic on exam. BP drop during HD with symptomatic N, V, dizziness, hot flashes.   Norvasc 10 mg nightly   Set EDW 60 7. Nutrition -Alb 2.6, Renal diet w/fluid restrictions. Protein supplements, vitamins.  8. 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  9. Dialysis access - L IJ TDC, BC NGTD. S/p L stage 1 BVT 11/11. Appreciate VVS consult. Planning for second stage on Monday  10. Hep C positive 11. 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 Per primary [facility] 11/13 no growth pending. Low grade temp noted yesterday. 12 Dispo- hoping will have OP spot by Monday and could be d/cd Mon or Tuesday    [facility]  Subjective:   Slept well- some cramping with HD this AM Objective Vitals:   09/27/18 0457 09/27/18 0730 09/27/18 0740 09/27/18 0750  BP: (!) 145/84 (!) 142/88 132/72 128/75  Pulse: 87 89 84 84  Resp: 19 (!) 21 20 19   Temp: 99.1 F (37.3 C) 97.9 F (36.6 C) 97.9 F (36.6 C)   TempSrc: Oral Oral Oral   SpO2: 100% 100%    Weight:  60.7 kg    Height:        Physical Exam General: NAD  Heart: RRR no rub Lungs: no rales Abdomen: soft NT + BS Extremities: no LE edema Dialysis Access: right IJ TDC left upper AVF + bruit   Additional Objective Labs: Basic Metabolic Panel: Recent Labs  Lab 09/25/18 0720 09/26/18 0321 09/27/18 0553  NA 133* 134* 137  K 3.6 3.9 4.2  CL 98 99 101  CO2 23 25 24   GLUCOSE 92 85 84  BUN 35* 18 36*  CREATININE 5.53* 3.58* 5.46*  CALCIUM 8.5* 8.5* 8.7*  PHOS 5.3* 3.8 5.7*   Liver Function Tests: Recent Labs  Lab 09/22/18 1317  09/25/18 0720 09/26/18 0321 09/27/18 0553  AST 87*  --   --   --   --   ALT 31  --   --   --   --   ALKPHOS 49  --   --   --   --   BILITOT 2.4*  --   --   --   --   PROT 7.1  --   --   --   --   ALBUMIN 2.6*   < > 2.5* 2.6* 2.5*   < > = values in this interval not displayed.   No results for input(s): LIPASE, AMYLASE in the last 168 hours. CBC: Recent Labs  Lab 09/22/18 1317 09/23/18 0550 09/23/18 2113 09/24/18 0356 09/25/18 0720  09/27/18 0759  WBC 7.0 7.6 10.3 8.7 7.1 6.9  NEUTROABS 4.4  --   --   --   --   --   HGB 9.5* 8.9* 10.1* 9.8* 9.0* 8.6*  HCT 33.4* 29.2* 32.6* 32.0* 29.2* 28.8*  MCV 74.4* 70.0* 70.6* 70.0* 71.6* 72.0*  PLT 173 153 154 155 159 153   Blood Culture    Component Value Date/Time   SDES BLOOD RIGHT ANTECUBITAL 09/22/2018 2023   SDES BLOOD RIGHT HAND 09/22/2018 2023   SPECREQUEST  09/22/2018 2023    BOTTLES DRAWN AEROBIC ONLY Blood Culture results may not be optimal due to an excessive volume of blood received in culture bottles   SPECREQUEST  09/22/2018 2023    BOTTLES DRAWN AEROBIC ONLY Blood Culture adequate volume   CULT  09/22/2018 2023    NO GROWTH 4 DAYS Performed at [facility], [place] [address], [place], [place] [address]    CULT  09/22/2018 2023    NO GROWTH 4 DAYS Performed at [facility], [place] [address], [place], [place] [address]    REPTSTATUS PENDING 09/22/2018 2023   REPTSTATUS PENDING 09/22/2018 2023     Cardiac Enzymes: Recent Labs  Lab 09/22/18 2202 09/23/18 0550  TROPONINI 0.16* 0.16*   CBG: Recent Labs  Lab 09/26/18 0733 09/26/18 1108 09/26/18 1631 09/26/18 2025 09/27/18 0612  GLUCAP 79 174* 99 88 83   Iron Studies:  No results for input(s): IRON, TIBC, TRANSFERRIN, FERRITIN in the last 72 hours. Lab Results  Component Value Date   INR 0.97 07/23/2018   INR 0.98 07/22/2018   INR 0.95 07/21/2018   Studies/Results: Vas US Duplex Dialysis Access (avf, Avg)  Result Date: 09/25/2018 DIALYSIS ACCESS Access Site: Left Upper Extremity. Access Type: Basilic vein transposition. Performing Technologist: Sharion Dove RVS  Examination Guidelines: A complete evaluation includes B-mode imaging, spectral Doppler, color Doppler, and power Doppler as needed of all accessible portions of each vessel. Unilateral testing is considered an integral part of a complete examination. Limited examinations for reoccurring indications may be performed as noted.  Findings:  +------------+----------+-------------+----------+--------+ OUTFLOW VEINPSV (cm/s)Diameter (cm)Depth (cm)Describe +------------+----------+-------------+----------+--------+ Prox UA                   0.86        2.57            +------------+----------+-------------+----------+--------+ Mid UA                    0.78        2.09            +------------+----------+-------------+----------+--------+ Dist UA                   0.59        1.91            +------------+----------+-------------+----------+--------+ Prox Forearm                          0.64            +------------+----------+-------------+----------+--------+  +--------------------+----------+-----------------+--------+ AVG                 PSV (cm/s)Flow Vol (mL/min)Describe +--------------------+----------+-----------------+--------+ Native artery inflow   218                               +--------------------+----------+-----------------+--------+ Diagnosing physician: Servando Snare MD Electronically signed  by Servando Snare MD on 09/25/2018 at 3:29:55 PM.   --------------------------------------------------------------------------------   Final    Medications: . sodium chloride    . sodium chloride    . [START ON 09/29/2018]  ceFAZolin (ANCEF) IV    . ferric gluconate (FERRLECIT/NULECIT) IV Stopped (09/25/18 1111)   . amLODipine  10 mg Oral QHS  . calcitRIOL  0.25 mcg Oral Q T,Th,Sa-HD  . Chlorhexidine Gluconate Cloth  6 each Topical Q0600  . darbepoetin (ARANESP) injection - DIALYSIS  40 mcg Intravenous Q Sat-HD  . diphenhydrAMINE  25 mg Intravenous Once  . feeding supplement (PRO-STAT SUGAR FREE 64)  30 mL Oral BID  . fluconazole  200 mg Oral Daily  . heparin injection (subcutaneous)  5,000 Units Subcutaneous Q8H  . insulin aspart  0-5 Units Subcutaneous QHS  . insulin aspart  0-9 Units Subcutaneous TID WC  . insulin glargine  5 Units Subcutaneous QHS  . multivitamin  1 tablet Oral QHS  . sodium chloride flush  3 mL Intravenous Q12H  . zolpidem  5 mg Oral QHS

## 2018-09-28 DIAGNOSIS — B182 Chronic viral hepatitis C: Secondary | ICD-10-CM | POA: Diagnosis present

## 2018-09-28 LAB — HCV RNA QUANT
HCV QUANT: 652000 [IU]/mL (ref >50)
HCV Quantitative Log: 5.814 log10 IU/mL (ref >1.70)

## 2018-09-28 LAB — GLUCOSE, CAPILLARY
GLUCOSE-CAPILLARY: 113 mg/dL — AB (ref 70–99)
Glucose-Capillary: 123 mg/dL — ABNORMAL HIGH (ref 70–99)
Glucose-Capillary: 116 mg/dL — ABNORMAL HIGH (ref 70–99)
Glucose-Capillary: 168 mg/dL — ABNORMAL HIGH (ref 70–99)

## 2018-09-28 LAB — RENAL FUNCTION PANEL
Albumin: 2.4 g/dL — ABNORMAL LOW (ref 3.5–5.0)
Anion gap: 10 (ref 5–15)
BUN: 23 mg/dL (ref 8–23)
CO2: 24 mmol/L (ref 22–32)
Calcium: 8.5 mg/dL — ABNORMAL LOW (ref 8.9–10.3)
Chloride: 102 mmol/L (ref 98–111)
Creatinine, Ser: 3.48 mg/dL — ABNORMAL HIGH (ref 0.44–1.00)
GFR calc Af Amer: 15 mL/min — ABNORMAL LOW (ref >60)
GFR calc non Af Amer: 13 mL/min — ABNORMAL LOW (ref >60)
Glucose, Bld: 93 mg/dL (ref 70–99)
Phosphorus: 3.1 mg/dL (ref 2.5–4.6)
Potassium: 4.1 mmol/L (ref 3.5–5.1)
Sodium: 136 mmol/L (ref 135–145)

## 2018-09-28 MED ORDER — MENTHOL 3 MG MT LOZG
1.0000 | LOZENGE | OROMUCOSAL | Status: DC | PRN
  Filled 2018-09-28: qty 9

## 2018-09-28 NOTE — Progress Notes (Signed)
   Subjective: She has no complaints this morning. We informed her of her active hep c infection and she wondered where she got the infection from. She denies any history of IV drug use or blood transfusions and is not sure how she got the infection. Otherwise she has no complaints and states that abdominal pain and headache are being treated with her current medicine regimen.   Objective:  Vital signs in last 24 hours: Vitals:   09/27/18 1259 09/27/18 1730 09/27/18 2119 09/28/18 0827  BP: (!) 149/78 125/71 137/69 132/64  Pulse: 92 96 93 93  Resp: 16 18  18   Temp: 99.1 F (37.3 C) 99.4 F (37.4 C) 99.2 F (37.3 C) 98.7 F (37.1 C)  TempSrc: Oral Oral Oral Oral  SpO2: 100% 100% 99% 100%  Weight:      Height:       General: awake, alert, sitting up in bed in NAD CV: RRR; no murmurs, rubs or gallops Pulm: normal respiratory effort; lungs CTA  Ext: no edema   Assessment/Plan:  Active Problems:   ESRD (end stage renal disease) (HCC)   Microcytic anemia   AKI (acute kidney injury) (HCC)   Hypertension   Abdominal pain   Type 2 diabetes mellitus (HCC)   Fungemia   Hepatitis C infection  Ms. 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 Undergoing CLIP process now.   1. ESRD on Dialysis 2/2 ANCA Vasculitis     Hyperkalemia - Stable. Uremia and hyperkalemia have resolved since receiving serial HD, followed by resumption of normal T/TH/S schedule -Next dialysis session on 1/21 - appreciate nephrology managing inpatient dialysis; undergoing CLIP process which they are hopeful will be complete early next week - vascular on board and will do left second stage fistula Monday   2. HTN -  blood pressures stable on amlodipine.   3. Type 2 DM:  - CBGs well controlled on Lantus 5 and SSI   4. Anemia of CKD - iron studies are on low end of normal;  - cont ferric gluconate - ESA per nephrology   5. Active Hep C Infection:  HCV ab and RNA positive meaning she has a active infection. Will send genotyping. Will need to follow-up with ID outpatient for further treatment.   -Hep C RNA-652,000 (nl <50) -HCV Genotyping labs today  6. 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   Dispo: Anticipated discharge pending completion of CLIP process.   Synetta Shadow, Medical Student 09/28/2018, 6:50 AM Pager: [phone]    I have seen and examined the patient, and reviewed the daily progress note by Harless Litten, MS 3 and discussed the care of the patient with them. Please see my progress note from 09/28/2018 for further details regarding assessment and plan.    SignedWelford Roche, MD 09/28/2018, 10:59 AM

## 2018-09-28 NOTE — Progress Notes (Signed)
[facility] Progress Note   Dialysis Orders: **She does NOT have an active HD unit, will need to undergo CLIP process again** Prev dialyzed at [facility] (4hr, EDW 59kg, 2K/2.25Ca, TDC, no heparin)  Assessment/Plan: 1.Hyperkalemia: d/t missed HD. Resolved. 2. Uremia - due to missed HD- resolved with adequate HD 3. ESRD -HD 1/13, 1/14. CLIP pending, doubtful previous clinic will accept her back due to non compliance.Now running TTS here- plans pending - HD today on schedule -on heparin here - not sue why no hep previously at output unit.  Needs encouragement to run full treatment. Next HD Tuesday- I do not think she needs 4 hr - has some degree of renal function and volume/K not an issue - will set time at 3.5 hours going forward. 4. Anemia of CKD-Hgb 9.8, now 8.6. Low Tsat, startedFe load.-on ESA started at 40 received 1/18- increased to 150 for next week - had been off ESA over 2 months 5. Secondary hyperparathyroidism -CCa and phos at goal, PTH 211 on low dose calcitriol. Follow trends. Likely will need binder now that appetite is back  6. HTN/volume -BP in goal, appears euvolemic on exam. BP drop during HD with symptomatic N, V, dizziness, hot flashes.  Norvasc 10 mg nightly   Set EDW ~60 post wt 60.6 essentially kept even last two treatments. 7. Nutrition -Alb 2.4, Renal diet w/fluid restrictions. Protein supplements, vitamins. 8. 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  9. Dialysis access - L IJ TDC,. S/p L stage 1 BVT 11/11. Appreciate VVS consult. second stage planned for Monday  10. Hep C positive- active infection - per primary 11. 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 Per primary [facility] 01/13 no . Low grade temp noted yesterday and today 12 Dispo- hoping will have OP spot by Monday and could be d/cd Mon or Tuesday- lives closest to [place], [place] [facility] [phone] 09/28/2018,9:22 AM  LOS: 6 days   Subjective:   No c/o  Objective Vitals:   09/27/18 1259 09/27/18 1730 09/27/18 2119 09/28/18 0827  BP: (!) 149/78 125/71 137/69 132/64  Pulse: 92 96 93 93  Resp: 16 18  18   Temp: 99.1 F (37.3 C) 99.4 F (37.4 C) 99.2 F (37.3 C) 98.7 F (37.1 C)  TempSrc: Oral Oral Oral Oral  SpO2: 100% 100% 99% 100%  Weight:      Height:       Physical Exam General: lying in bed NAD Heart: RRR Lungs: no rales Abdomen: soft NT  Extremities: no LE edema  Dialysis Access:  Right IJ TDC left upper AVF 1st stage patent   Additional Objective Labs: Basic Metabolic Panel: Recent Labs  Lab 09/27/18 0553 09/27/18 0759 09/28/18 0518  NA 137 136 136  K 4.2 4.3 4.1  CL 101 101 102  CO2 24 24 24   GLUCOSE 84 102* 93  BUN 36* 38* 23  CREATININE 5.46* 5.61* 3.48*  CALCIUM 8.7* 8.8* 8.5*  PHOS 5.7* 5.8* 3.1   Liver Function Tests: Recent Labs  Lab 09/22/18 1317  09/27/18 0553 09/27/18 0759 09/28/18 0518  AST 87*  --   --   --   --   ALT 31  --   --   --   --   ALKPHOS 49  --   --   --   --   BILITOT 2.4*  --   --   --   --  PROT 7.1  --   --   --   --   ALBUMIN 2.6*   < > 2.5* 2.5* 2.4*   < > = values in this interval not displayed.   No results for input(s): LIPASE, AMYLASE in the last 168 hours. CBC: Recent Labs  Lab 09/22/18 1317 09/23/18 0550 09/23/18 2113 09/24/18 0356 09/25/18 0720 09/27/18 0759  WBC 7.0 7.6 10.3 8.7 7.1 6.9  NEUTROABS 4.4  --   --   --   --   --   HGB 9.5* 8.9* 10.1* 9.8* 9.0* 8.6*  HCT 33.4* 29.2* 32.6* 32.0* 29.2* 28.8*  MCV 74.4* 70.0* 70.6* 70.0* 71.6* 72.0*  PLT 173 153 154 155 159 153   Blood Culture    Component Value Date/Time   SDES BLOOD RIGHT ANTECUBITAL 09/22/2018 2023   SDES BLOOD RIGHT HAND 09/22/2018 2023   SPECREQUEST  09/22/2018 2023    BOTTLES DRAWN AEROBIC ONLY Blood Culture results may not be optimal due to an excessive volume of blood received in culture  bottles   SPECREQUEST  09/22/2018 2023    BOTTLES DRAWN AEROBIC ONLY Blood Culture adequate volume   CULT  09/22/2018 2023    NO GROWTH 5 DAYS Performed at [facility], [place] [address] [place], [place] [address]    CULT  09/22/2018 2023    NO GROWTH 5 DAYS Performed at [place] [address], [place], [place] [address]    REPTSTATUS 09/27/2018 FINAL 09/22/2018 2023   REPTSTATUS 09/27/2018 FINAL 09/22/2018 2023    Cardiac Enzymes: Recent Labs  Lab 09/22/18 2202 09/23/18 0550  TROPONINI 0.16* 0.16*   CBG: Recent Labs  Lab 09/27/18 1258 09/27/18 1620 09/27/18 1645 09/27/18 2120 09/28/18 0759  GLUCAP 104* 150* 154* 100* 123*   Iron Studies: No results for input(s): IRON, TIBC, TRANSFERRIN, FERRITIN in the last 72 hours. Lab Results  Component Value Date   INR 0.97 07/23/2018   INR 0.98 07/22/2018   INR 0.95 07/21/2018   Studies/Results: No results found. Medications: . [START ON 09/29/2018]  ceFAZolin (ANCEF) IV    . ferric gluconate (FERRLECIT/NULECIT) IV Stopped (09/27/18 1145)   . amLODipine  10 mg Oral QHS  . calcitRIOL  0.25 mcg Oral Q T,Th,Sa-HD  . Chlorhexidine Gluconate Cloth  6 each Topical Q0600  . darbepoetin (ARANESP) injection - DIALYSIS  150 mcg Intravenous Q Sat-HD  . diphenhydrAMINE  25 mg Intravenous Once  . feeding supplement (PRO-STAT SUGAR FREE 64)  30 mL Oral BID  . fluconazole  200 mg Oral Daily  . heparin injection (subcutaneous)  5,000 Units Subcutaneous Q8H  . insulin aspart  0-5 Units Subcutaneous QHS  . insulin aspart  0-9 Units Subcutaneous TID WC  . insulin glargine  5 Units Subcutaneous QHS  . multivitamin  1 tablet Oral QHS  . sodium chloride flush  3 mL Intravenous Q12H  . zolpidem  5 mg Oral QHS

## 2018-09-28 NOTE — Progress Notes (Signed)
   09/27/18 2006  Provider Notification  Provider Name/Title Internal Medicine  Date Provider Notified 09/27/18  Time Provider Notified 2006  Notification Type Page  Notification Reason Requested by patient/family (Pt wants to take shower - ok to take tele off?)  Response See new orders  Date of Provider Response 09/27/18  Time of Provider Response 2009   Patient wanted to take bath but now states she has already washed up.  Order to take telemetry off patient for bath if she decides to take one is in chart.  Will continue to monitor patient.  Earleen Reaper RN

## 2018-09-28 NOTE — Progress Notes (Signed)
Medicine attending: Clinical status and lab database reviewed with resident physician Dr. Isac Sarna and medical student Ms. Harless Litten and I concur with her evaluation and management plan. Potassium remains stable and in normal range back on regular dialysis schedule. Newly diagnosed hepatitis C.  She denies any risk factors.  Quantitative RNA 652,000, log 5.8.  Genotype testing in progress. Liver functions on admission with decreased albumin 2.6, slight elevation of AST at 87, normal ALT 31, elevated bilirubin 2.4.  We will make referral to hepatitis C clinic at discharge. Hepatitis A and B negative. She has not been screened for HIV since June 2019 and we should repeat this.

## 2018-09-29 ENCOUNTER — Encounter (HOSPITAL_COMMUNITY)
Admission: EM | Discharge status: Against Medical Advice | Payer: Self-pay | Source: Home / Self Care | Attending: Internal Medicine

## 2018-09-29 ENCOUNTER — Inpatient Hospital Stay (HOSPITAL_COMMUNITY): Payer: Medicare Other | Admitting: Certified Registered Nurse Anesthetist

## 2018-09-29 ENCOUNTER — Telehealth: Payer: Self-pay | Admitting: Vascular Surgery

## 2018-09-29 DIAGNOSIS — N185 Chronic kidney disease, stage 5: Secondary | ICD-10-CM

## 2018-09-29 HISTORY — PX: BASCILIC VEIN TRANSPOSITION: SHX5742

## 2018-09-29 LAB — RENAL FUNCTION PANEL
ANION GAP: 11 (ref 5–15)
Albumin: 2.7 g/dL — ABNORMAL LOW (ref 3.5–5.0)
BUN: 37 mg/dL — ABNORMAL HIGH (ref 8–23)
CO2: 22 mmol/L (ref 22–32)
Calcium: 8.9 mg/dL (ref 8.9–10.3)
Chloride: 101 mmol/L (ref 98–111)
Creatinine, Ser: 5.16 mg/dL — ABNORMAL HIGH (ref 0.44–1.00)
GFR calc Af Amer: 9 mL/min — ABNORMAL LOW (ref >60)
GFR calc non Af Amer: 8 mL/min — ABNORMAL LOW (ref >60)
Glucose, Bld: 117 mg/dL — ABNORMAL HIGH (ref 70–99)
Phosphorus: 2.8 mg/dL (ref 2.5–4.6)
Potassium: 4.7 mmol/L (ref 3.5–5.1)
Sodium: 134 mmol/L — ABNORMAL LOW (ref 135–145)

## 2018-09-29 LAB — GLUCOSE, CAPILLARY
GLUCOSE-CAPILLARY: 106 mg/dL — AB (ref 70–99)
Glucose-Capillary: 100 mg/dL — ABNORMAL HIGH (ref 70–99)
Glucose-Capillary: 157 mg/dL — ABNORMAL HIGH (ref 70–99)
Glucose-Capillary: 167 mg/dL — ABNORMAL HIGH (ref 70–99)
Glucose-Capillary: 329 mg/dL — ABNORMAL HIGH (ref 70–99)
Glucose-Capillary: 329 mg/dL — ABNORMAL HIGH (ref 70–99)

## 2018-09-29 LAB — CBC
HCT: 28.9 % — ABNORMAL LOW (ref 36.0–46.0)
Hemoglobin: 8.8 g/dL — ABNORMAL LOW (ref 12.0–15.0)
MCH: 21.6 pg — ABNORMAL LOW (ref 26.0–34.0)
MCHC: 30.4 g/dL (ref 30.0–36.0)
MCV: 71 fL — ABNORMAL LOW (ref 80.0–100.0)
Platelets: 167 10*3/uL (ref 150–400)
RBC: 4.07 MIL/uL (ref 3.87–5.11)
RDW: 22.1 % — ABNORMAL HIGH (ref 11.5–15.5)
WBC: 15.3 10*3/uL — AB (ref 4.0–10.5)
nRBC: 0 % (ref 0.0–0.2)

## 2018-09-29 LAB — PROTIME-INR
INR: 0.96
Prothrombin Time: 12.7 seconds (ref 11.4–15.2)

## 2018-09-29 SURGERY — TRANSPOSITION, VEIN, BASILIC
Anesthesia: General | Site: Arm Upper | Laterality: Left

## 2018-09-29 MED ORDER — PHENYLEPHRINE HCL 10 MG/ML IJ SOLN
INTRAMUSCULAR | Status: DC | PRN
  Administered 2018-09-29 (×3): 80 ug via INTRAVENOUS

## 2018-09-29 MED ORDER — PROPOFOL 10 MG/ML IV BOLUS
INTRAVENOUS | Status: DC | PRN
  Administered 2018-09-29: 50 mg via INTRAVENOUS
  Administered 2018-09-29: 150 mg via INTRAVENOUS

## 2018-09-29 MED ORDER — SODIUM CHLORIDE 0.9 % IV SOLN
INTRAVENOUS | Status: DC | PRN
  Administered 2018-09-29: 30 ug/min via INTRAVENOUS

## 2018-09-29 MED ORDER — SODIUM CHLORIDE 0.9 % IV SOLN
INTRAVENOUS | Status: DC | PRN
  Administered 2018-09-29: 10:00:00

## 2018-09-29 MED ORDER — OXYCODONE HCL 5 MG PO TABS
5.0000 mg | ORAL_TABLET | ORAL | Status: DC | PRN
  Administered 2018-09-29 (×2): 5 mg via ORAL
  Filled 2018-09-29 (×2): qty 1

## 2018-09-29 MED ORDER — HEPARIN SODIUM (PORCINE) 1000 UNIT/ML IJ SOLN
INTRAMUSCULAR | Status: DC | PRN
  Administered 2018-09-29: 3000 [IU] via INTRAVENOUS

## 2018-09-29 MED ORDER — 0.9 % SODIUM CHLORIDE (POUR BTL) OPTIME
TOPICAL | Status: DC | PRN
  Administered 2018-09-29: 1000 mL

## 2018-09-29 MED ORDER — MIDAZOLAM HCL 5 MG/5ML IJ SOLN
INTRAMUSCULAR | Status: DC | PRN
  Administered 2018-09-29: 2 mg via INTRAVENOUS

## 2018-09-29 MED ORDER — ONDANSETRON HCL 4 MG/2ML IJ SOLN
INTRAMUSCULAR | Status: DC | PRN
  Administered 2018-09-29: 4 mg via INTRAVENOUS

## 2018-09-29 MED ORDER — DEXAMETHASONE SODIUM PHOSPHATE 10 MG/ML IJ SOLN
INTRAMUSCULAR | Status: DC | PRN
  Administered 2018-09-29: 5 mg via INTRAVENOUS

## 2018-09-29 MED ORDER — LIDOCAINE HCL (CARDIAC) PF 100 MG/5ML IV SOSY
PREFILLED_SYRINGE | INTRAVENOUS | Status: DC | PRN
  Administered 2018-09-29: 40 mg via INTRATRACHEAL

## 2018-09-29 MED ORDER — FENTANYL CITRATE (PF) 250 MCG/5ML IJ SOLN
INTRAMUSCULAR | Status: AC
  Filled 2018-09-29: qty 5

## 2018-09-29 MED ORDER — DARBEPOETIN ALFA 40 MCG/0.4ML IJ SOSY
40.0000 ug | PREFILLED_SYRINGE | INTRAMUSCULAR | Status: DC
  Filled 2018-09-29: qty 0.4

## 2018-09-29 MED ORDER — SODIUM CHLORIDE 0.9 % IV SOLN
INTRAVENOUS | Status: DC
  Administered 2018-09-29 – 2018-10-06 (×4): via INTRAVENOUS

## 2018-09-29 MED ORDER — EPHEDRINE SULFATE 50 MG/ML IJ SOLN
INTRAMUSCULAR | Status: DC | PRN
  Administered 2018-09-29: 10 mg via INTRAVENOUS
  Administered 2018-09-29: 5 mg via INTRAVENOUS
  Administered 2018-09-29: 10 mg via INTRAVENOUS
  Administered 2018-09-29: 5 mg via INTRAVENOUS

## 2018-09-29 MED ORDER — MIDAZOLAM HCL 2 MG/2ML IJ SOLN
INTRAMUSCULAR | Status: AC
  Filled 2018-09-29: qty 2

## 2018-09-29 MED ORDER — SODIUM CHLORIDE 0.9 % IV SOLN
INTRAVENOUS | Status: AC
  Filled 2018-09-29: qty 1.2

## 2018-09-29 MED ORDER — LIDOCAINE HCL (PF) 1 % IJ SOLN
INTRAMUSCULAR | Status: AC
  Filled 2018-09-29: qty 30

## 2018-09-29 MED ORDER — CHLORHEXIDINE GLUCONATE CLOTH 2 % EX PADS
6.0000 | MEDICATED_PAD | Freq: Every day | CUTANEOUS | Status: DC
  Administered 2018-09-30 – 2018-10-01 (×2): 6 via TOPICAL

## 2018-09-29 MED ORDER — MORPHINE SULFATE (PF) 2 MG/ML IV SOLN: 2.0000 mg | INTRAVENOUS | Status: DC | PRN

## 2018-09-29 MED ORDER — FENTANYL CITRATE (PF) 100 MCG/2ML IJ SOLN
INTRAMUSCULAR | Status: DC | PRN
  Administered 2018-09-29: 25 ug via INTRAVENOUS
  Administered 2018-09-29 (×3): 50 ug via INTRAVENOUS
  Administered 2018-09-29: 25 ug via INTRAVENOUS

## 2018-09-29 SURGICAL SUPPLY — 48 items
ADH SKN CLS APL DERMABOND .7 (GAUZE/BANDAGES/DRESSINGS) ×3
AGENT HMST SPONGE THK3/8 (HEMOSTASIS)
ARMBAND PINK RESTRICT EXTREMIT (MISCELLANEOUS) ×3 IMPLANT
CANISTER SUCT 3000ML PPV (MISCELLANEOUS) ×3 IMPLANT
CLIP VESOCCLUDE MED 24/CT (CLIP) ×3 IMPLANT
CLIP VESOCCLUDE SM WIDE 24/CT (CLIP) ×3 IMPLANT
COVER PROBE W GEL 5X96 (DRAPES) ×3 IMPLANT
COVER WAND RF STERILE (DRAPES) ×3 IMPLANT
DECANTER SPIKE VIAL GLASS SM (MISCELLANEOUS) ×3 IMPLANT
DERMABOND ADVANCED (GAUZE/BANDAGES/DRESSINGS) ×6
DERMABOND ADVANCED .7 DNX12 (GAUZE/BANDAGES/DRESSINGS) ×1 IMPLANT
ELECT REM PT RETURN 9FT ADLT (ELECTROSURGICAL) ×3
ELECTRODE REM PT RTRN 9FT ADLT (ELECTROSURGICAL) ×1 IMPLANT
GLOVE BIO SURGEON STRL SZ 6.5 (GLOVE) ×1 IMPLANT
GLOVE BIO SURGEON STRL SZ7.5 (GLOVE) ×5 IMPLANT
GLOVE BIO SURGEONS STRL SZ 6.5 (GLOVE) ×1
GLOVE BIOGEL PI IND STRL 6.5 (GLOVE) IMPLANT
GLOVE BIOGEL PI IND STRL 7.0 (GLOVE) IMPLANT
GLOVE BIOGEL PI IND STRL 8 (GLOVE) ×1 IMPLANT
GLOVE BIOGEL PI INDICATOR 6.5 (GLOVE) ×4
GLOVE BIOGEL PI INDICATOR 7.0 (GLOVE) ×4
GLOVE BIOGEL PI INDICATOR 8 (GLOVE) ×2
GLOVE ECLIPSE 6.5 STRL STRAW (GLOVE) ×2 IMPLANT
GOWN STRL REUS W/ TWL LRG LVL3 (GOWN DISPOSABLE) ×2 IMPLANT
GOWN STRL REUS W/ TWL XL LVL3 (GOWN DISPOSABLE) ×2 IMPLANT
GOWN STRL REUS W/TWL LRG LVL3 (GOWN DISPOSABLE) ×3
GOWN STRL REUS W/TWL XL LVL3 (GOWN DISPOSABLE) ×9
HEMOSTAT SPONGE AVITENE ULTRA (HEMOSTASIS) IMPLANT
KIT BASIN OR (CUSTOM PROCEDURE TRAY) ×3 IMPLANT
KIT TURNOVER KIT B (KITS) ×3 IMPLANT
NDL HYPO 25GX1X1/2 BEV (NEEDLE) ×1 IMPLANT
NEEDLE HYPO 25GX1X1/2 BEV (NEEDLE) ×3 IMPLANT
NS IRRIG 1000ML POUR BTL (IV SOLUTION) ×3 IMPLANT
PACK CV ACCESS (CUSTOM PROCEDURE TRAY) ×3 IMPLANT
PAD ARMBOARD 7.5X6 YLW CONV (MISCELLANEOUS) ×6 IMPLANT
SUT MNCRL AB 4-0 PS2 18 (SUTURE) ×5 IMPLANT
SUT PROLENE 6 0 BV (SUTURE) ×7 IMPLANT
SUT PROLENE 7 0 BV 1 (SUTURE) IMPLANT
SUT SILK 2 0 SH (SUTURE) IMPLANT
SUT SILK 3 0 (SUTURE) ×3
SUT SILK 3-0 18XBRD TIE 12 (SUTURE) IMPLANT
SUT VIC AB 2-0 CT1 27 (SUTURE) ×3
SUT VIC AB 2-0 CT1 TAPERPNT 27 (SUTURE) ×1 IMPLANT
SUT VIC AB 3-0 SH 27 (SUTURE) ×6
SUT VIC AB 3-0 SH 27X BRD (SUTURE) ×2 IMPLANT
TOWEL GREEN STERILE (TOWEL DISPOSABLE) ×3 IMPLANT
UNDERPAD 30X30 (UNDERPADS AND DIAPERS) ×3 IMPLANT
WATER STERILE IRR 1000ML POUR (IV SOLUTION) ×3 IMPLANT

## 2018-09-29 NOTE — Progress Notes (Signed)
[facility]  Subjective  - No complaints.   Objective (!) 151/79 97 99.4 F (37.4 C) (Oral) 18 100%  Intake/Output Summary (Last 24 hours) at 09/29/2018 0945 Last data filed at 09/29/2018 0900 Gross per 24 hour  Intake 700 ml  Output 0 ml  Net 700 ml    Left first stage brachiobasilic fistula with good thrill  Laboratory Lab Results: Recent Labs    09/27/18 0759 09/29/18 0400  WBC 6.9 15.3*  HGB 8.6* 8.8*  HCT 28.8* 28.9*  PLT 153 167   BMET Recent Labs    09/28/18 0518 09/29/18 0400  NA 136 134*  K 4.1 4.7  CL 102 101  CO2 24 22  GLUCOSE 93 117*  BUN 23 37*  CREATININE 3.48* 5.16*  CALCIUM 8.5* 8.9    COAG Lab Results  Component Value Date   INR 0.96 09/29/2018   INR 0.97 07/23/2018   INR 0.98 07/22/2018   No results found for: PTT  Assessment/Planning:  Plan for left 2nd stage brachiobasilic fistula after risks and benefits discussed.  Marty Heck 09/29/2018 9:45 AM --

## 2018-09-29 NOTE — Progress Notes (Signed)
RN verified the presence of a signed informed consent that matches stated procedure by patient. Verified armband matches patient's stated name and birth date. Verified NPO status and that all jewelry, contact, glasses, dentures, and partials had been removed (if applicable).  

## 2018-09-29 NOTE — Anesthesia Preprocedure Evaluation (Signed)
Anesthesia Evaluation  Patient identified by MRN, date of birth, ID band Patient awake    Reviewed: Allergy & Precautions, NPO status , Patient's Chart, lab work & pertinent test results  Airway Mallampati: II  TM Distance: >3 FB Neck ROM: Full    Dental  (+) Teeth Intact, Dental Advisory Given   Pulmonary Current Smoker,    breath sounds clear to auscultation       Cardiovascular hypertension,  Rhythm:Regular Rate:Normal     Neuro/Psych    GI/Hepatic   Endo/Other  diabetes  Renal/GU      Musculoskeletal   Abdominal   Peds  Hematology   Anesthesia Other Findings   Reproductive/Obstetrics                             Anesthesia Physical Anesthesia Plan  ASA: III  Anesthesia Plan: General   Post-op Pain Management:    Induction: Intravenous  PONV Risk Score and Plan: Ondansetron  Airway Management Planned: LMA  Additional Equipment:   Intra-op Plan:   Post-operative Plan:   Informed Consent: I have reviewed the patients History and Physical, chart, labs and discussed the procedure including the risks, benefits and alternatives for the proposed anesthesia with the patient or authorized representative who has indicated his/her understanding and acceptance.     Dental advisory given  Plan Discussed with: CRNA and Anesthesiologist  Anesthesia Plan Comments:         Anesthesia Quick Evaluation

## 2018-09-29 NOTE — Telephone Encounter (Signed)
sch appt spk to friend mld ltr 10/28/2018 1015am p/o MD

## 2018-09-29 NOTE — Anesthesia Postprocedure Evaluation (Signed)
Anesthesia Post Note  Patient: [facility]  Procedure(s) Performed: BASILIC VEIN TRANSPOSITION SECOND STAGE LEFT UPPER ARM (Left Arm Upper)     Patient location during evaluation: PACU Anesthesia Type: General Level of consciousness: awake and alert Pain management: pain level controlled Vital Signs Assessment: post-procedure vital signs reviewed and stable Respiratory status: spontaneous breathing, nonlabored ventilation, respiratory function stable and patient connected to nasal cannula oxygen Cardiovascular status: blood pressure returned to baseline and stable Postop Assessment: no apparent nausea or vomiting Anesthetic complications: no    Last Vitals:  Vitals:   09/29/18 1340 09/29/18 1453  BP: 111/62 113/65  Pulse: 95 90  Resp: 19 (!) 22  Temp: (!) 36.3 C 36.8 C  SpO2: 95% 95%    Last Pain:  Vitals:   09/29/18 1453  TempSrc: Oral  PainSc:                  Kailynne Ferrington COKER

## 2018-09-29 NOTE — Progress Notes (Signed)
   Subjective: Rhonda Lynch was seen and evaluated in PACU after her vascular procedure for 2nd stage fistula. Earlier this morning she was feeling well without complaints. No fevers or chills overnight.   Objective:  Vital signs in last 24 hours: Vitals:   09/28/18 0827 09/28/18 1619 09/28/18 2000 09/29/18 1241  BP: 132/64 137/77 (!) 151/79 108/71  Pulse: 93 94 97 97  Resp: 18 17 18  (P) 19  Temp: 98.7 F (37.1 C) 99.1 F (37.3 C) 99.4 F (37.4 C) (!) 97.5 F (36.4 C)  TempSrc: Oral Oral Oral   SpO2: 100% 97% 100% 100%  Weight:      Height:       General: seen in PACU post-anesthesia; briefly awakens to voice  CV: RRR Abd: soft, non-distended, non-tender Ext: no edema   Assessment/Plan:  Principal Problem:   ESRD (end stage renal disease) (HCC) Active Problems:   Microcytic anemia   AKI (acute kidney injury) ([place])   Hypertension   Abdominal pain   Type 2 diabetes mellitus (HCC)   Hepatitis C infection  Rhonda Lynch is a 63 yo female with a hx significant for 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 Undergoing CLIP process now.  1.ESRD on Dialysis 2/2 ANCA Vasculitis   Hyperkalemia - Stable. Uremia and hyperkalemia have resolved since receiving serial HD, followed by resumption of normal T/TH/S schedule -Next dialysis session on 1/21 - appreciate nephrology managing inpatient dialysis; undergoing CLIP process which they are hopeful will be complete early next week - vascular performing second stage fistula today  2. Leukocytosis: - patient is afebrile without signs or symptoms of infection  - she is not on steroids - limited exam due to somnolence in PACU; will perform detailed skin check tomorrow to ensure the does  not have any pressure wounds; otherwise will continue to monitor CBC and signs of infection   3.HTN -blood pressures stable on amlodipine.   4.Type 2 DM: - CBGs well controlled on Lantus 5 and SSI  5.Anemia of CKD - hgb has remained stable - iron studies are on low end of normal - loaded with iron and started on ESA per nephrology    6. Active Hep C Infection: - genotyping pending - follow-up in Hep C clinic outpatient  - denies risk factors; HIV pending   7. 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 She was turned down by 2 centers due to prior noncompliance. CLIP now reaching out to other units.   Modena Nunnery D, DO 09/29/2018, 12:55 PM Pager: [phone]

## 2018-09-29 NOTE — Progress Notes (Signed)
Prior to leaving for OR 1 bag of belongings taken to PACU

## 2018-09-29 NOTE — Telephone Encounter (Signed)
-----   Message from Dagoberto Ligas, PA-C sent at 09/29/2018 12:22 PM EST -----  Can you schedule an appt for this pt with Dr. Carlis Abbott in about 4 weeks.  PO L 2nd stage basilic fistula. Thanks, [facility]

## 2018-09-29 NOTE — Progress Notes (Addendum)
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   OT comments  Pt progressing toward established goals. Pt currently mod independent for ADL while standing at sink level and mod independent functional mobility with increased time. Pt maintained SpO2 >95% on RA throughout session. All education complete and pt has no additional question, no additional OT needs identified. Pt current level of functioning appropriate for d/c home when medically stable. OT to sign off.    Follow Up Recommendations  No OT follow up    Equipment Recommendations  None recommended by OT    Recommendations for Other Services      Precautions / Restrictions Precautions Precautions: Other (comment)(monitor O2 with mobility )       Mobility Bed Mobility Overal bed mobility: Independent                Transfers Overall transfer level: Modified independent Equipment used: None             General transfer comment: steady and safe    Balance Overall balance assessment: Independent                                         ADL either performed or assessed with clinical judgement   ADL Overall ADL's : Needs assistance/impaired Eating/Feeding: Set up;Sitting   Grooming: Wash/dry face;Standing;Modified independent                   Toilet Transfer: Modified Independent           Functional mobility during ADLs: Modified independent General ADL Comments: Pt completed mobility in room with SpO2  maintained >95% RA; pt mod I for functional mobility during ADL     Vision       Perception     Praxis      Cognition Arousal/Alertness: Awake/alert Behavior During Therapy: WFL for tasks assessed/performed Overall Cognitive Status: Within Functional Limits for tasks assessed                                          Exercises     Shoulder Instructions       General Comments      Pertinent Vitals/ Pain       Pain Assessment: 0-10 Pain Score: 9  Pain Location: Left arm surgical site Pain Descriptors / Indicators: Sore Pain Intervention(s): Limited activity within patient's tolerance;Monitored during session  Home Living                                          Prior Functioning/Environment              Frequency  Min 2X/week        Progress Toward Goals  OT Goals(current goals can now be found in the care  plan section)  Progress towards OT goals: Goals met/education completed, patient discharged from OT(current level appropriate for d/c)  Acute Rehab OT Goals Patient Stated Goal: to get home OT Goal Formulation: With patient Time For Goal Achievement: 10/08/18 Potential to Achieve Goals: Good ADL Goals Pt Will Perform Grooming: Independently;standing Pt Will Perform Upper Body Bathing: Independently;sitting Pt Will Perform Lower Body Bathing: Independently;sit to/from stand  Plan Other (comment);All goals met and education completed, patient discharged from OT services(Current level of function appropriate for discharge.)    Co-evaluation                 AM-PAC OT "6 Clicks" Daily Activity     Outcome Measure   Help from another person eating meals?: None Help from another person taking care of personal grooming?: None Help from another person toileting, which includes using toliet, bedpan, or urinal?: None Help from another person bathing (including washing, rinsing, drying)?: None Help from another  person to put on and taking off regular upper body clothing?: None Help from another person to put on and taking off regular lower body clothing?: None 6 Click Score: 24    End of Session Equipment Utilized During Treatment: Gait belt  OT Visit Diagnosis: Unsteadiness on feet (R26.81)   Activity Tolerance Patient tolerated treatment well   Patient Left in chair;with call bell/phone within reach   Nurse Communication Mobility status        Time: 1281-1886 OT Time Calculation (min): 13 min  Charges: OT General Charges $OT Visit: 1 Visit OT Treatments $Self Care/Home Management : 8-22 mins  Rhonda Lynch OTR/L Acute Rehabilitation Services Office: [place] 09/29/2018, 4:39 PM

## 2018-09-29 NOTE — Discharge Instructions (Signed)
° °  [facility] [place] ° °Discharge Instructions ° °AV Fistula or Graft Surgery for Dialysis Access ° °Please refer to the following instructions for your post-procedure care. Your surgeon or physician assistant will discuss any changes with you. ° °Activity ° °You may drive the day following your surgery, if you are comfortable and no longer taking prescription pain medication. Resume full activity as the soreness in your incision resolves. ° °Bathing/Showering ° °You may shower after you go home. Keep your incision dry for 48 hours. Do not soak in a bathtub, hot tub, or swim until the incision heals completely. You may not shower if you have a hemodialysis catheter. ° °Incision Care ° °Clean your incision with mild soap and water after 48 hours. Pat the area dry with a clean towel. You do not need a bandage unless otherwise instructed. Do not apply any ointments or creams to your incision. You may have skin glue on your incision. Do not peel it off. It will come off on its own in about one week. Your arm may swell a bit after surgery. To reduce swelling use pillows to elevate your arm so it is above your heart. Your doctor will tell you if you need to lightly wrap your arm with an ACE bandage. ° °Diet ° °Resume your normal diet. There are not special food restrictions following this procedure. In order to heal from your surgery, it is CRITICAL to get adequate nutrition. Your body requires vitamins, minerals, and protein. Vegetables are the best source of vitamins and minerals. Vegetables also provide the perfect balance of protein. Processed food has little nutritional value, so try to avoid this. ° °Medications ° °Resume taking all of your medications. If your incision is causing pain, you may take over-the counter pain relievers such as acetaminophen (Tylenol). If you were prescribed a stronger pain medication, please be aware these medications can cause nausea and constipation. Prevent  nausea by taking the medication with a snack or meal. Avoid constipation by drinking plenty of fluids and eating foods with high amount of fiber, such as fruits, vegetables, and grains. Do not take Tylenol if you are taking prescription pain medications. ° ° ° ° °Follow up °Your surgeon may want to see you in the office following your access surgery. If so, this will be arranged at the time of your surgery. ° °Please call us immediately for any of the following conditions: ° °Increased pain, redness, drainage (pus) from your incision site °Fever of 101 degrees or higher °Severe or worsening pain at your incision site °Hand pain or numbness. ° °Reduce your risk of vascular disease: ° °Stop smoking. If you would like help, call [facility] at 1-800-QUIT-NOW (1-800-784-8669) or  at 336-586-4000 ° °Manage your cholesterol °Maintain a desired weight °Control your diabetes °Keep your blood pressure down ° °Dialysis ° °It will take several weeks to several months for your new dialysis access to be ready for use. Your surgeon will determine when it is OK to use it. Your nephrologist will continue to direct your dialysis. You can continue to use your Permcath until your new access is ready for use. ° °If you have any questions, please call the office at [phone]. ° °

## 2018-09-29 NOTE — Transfer of Care (Signed)
Immediate Anesthesia Transfer of Care Note  Patient: Rhonda Lynch  Procedure(s) Performed: BASILIC VEIN TRANSPOSITION SECOND STAGE LEFT UPPER ARM (Left Arm Upper)  Patient Location: PACU  Anesthesia Type:General  Level of Consciousness: drowsy and responds to stimulation  Airway & Oxygen Therapy: Patient Spontanous Breathing and Patient connected to face mask oxygen  Post-op Assessment: Report given to RN and Post -op Vital signs reviewed and stable  Post vital signs: Reviewed and stable  Last Vitals:  Vitals Value Taken Time  BP 108/71 09/29/2018 12:39 PM  Temp    Pulse 96 09/29/2018 12:40 PM  Resp 18 09/29/2018 12:40 PM  SpO2 100 % 09/29/2018 12:40 PM  Vitals shown include unvalidated device data.  Last Pain:  Vitals:   09/28/18 2211  TempSrc:   PainSc: Asleep      Patients Stated Pain Goal: 0 (79/53/69 2230)  Complications: No apparent anesthesia complications

## 2018-09-29 NOTE — Progress Notes (Addendum)
[facility] Progress Note   Dialysis Orders: **She does NOT have an active HD unit, will need to undergo CLIP process again** Prev dialyzed at [facility] (4hr, EDW 59kg, 2K/2.25Ca, TDC, no heparin)  Assessment/Plan: 1.Hyperkalemia: d/t missed HD. Resolved. 2. Uremia - due to missed HD-resolved with adequate HD 3. ESRD -HD 1/13, 1/14. CLIP pending, doubtful previous clinic will accept her back due to non compliance.Now running TTS here- plans pending - HD today on schedule -on heparin here - not sue why noheppreviously at output unit.  Needs encouragement to run full treatment. Next HD Tuesday- I do not think she needs 4 hr - has some degree of renal function and volume/K not an issue - will set time at 3.5 hours going forward. 4. Anemia of CKD with Fe deficiency-Hgb 8.8 stable. Low Tsat, startedFe load.-on ESA started at 40 received 1/18-  had been off ESA over 2 months. Will take a while before it trends up 5. Secondary hyperparathyroidism -CCa and phos at goal, PTH 211 on low dose calcitriol. Follow trends.No binder yet 6. HTN/volume -BP in goal,Norvasc 10 mgnightlySet EDW ~60 post wt 60.6 essentially kept even last two treatments. 7. Nutrition -Alb 2.7Renal diet w/fluid restrictions. Protein supplements, vitamins. 8. 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  9. Dialysis access - L IJ TDC,. S/p L stage 2nd stage 1/20Appreciate VVS help  10. Hep C positive- active infection - per primary 11. Hx Fungemia w/possible TV involvement - Per ID in 06/2018, recommended Fluconazole indefinitelyBC 01/13 no growth . Low grade temp noted- asymptomatic 12 Dispo- was hoping we would have OP spot by today but turned down by [facility] and [facility] due to prior noncompliance.  CLIP sending out info to other units.   Myriam Jacobson, PA-C [place] 09/29/2018,10:44 AM  LOS: 7 days   Pt seen, examined and agree w A/P as above.   Kelly Splinter MD [facility] pager [phone]   09/29/2018, 4:32 PM      Subjective:   Left arm is a little sore. Asked if we had found her a center for dialysis yet. Explained why she was turned down for two of them.  Discussed the importance of demonstrating compliance with dialysis while her to increase change of acceptance into outpatient center.  Objective Vitals:   09/27/18 2119 09/28/18 0827 09/28/18 1619 09/28/18 2000  BP: 137/69 132/64 137/77 (!) 151/79  Pulse: 93 93 94 97  Resp:  18 17 18   Temp: 99.2 F (37.3 C) 98.7 F (37.1 C) 99.1 F (37.3 C) 99.4 F (37.4 C)  TempSrc: Oral Oral Oral Oral  SpO2: 99% 100% 97% 100%  Weight:      Height:       Physical Exam General: NAD breathing easily Heart: RRR Lungs: grossly clear Abdomen: soft NT + BS  Extremities: tr LE edema Dialysis Access:  Left upper AVF 2/b 2nd stage 1/20 + soft bruit   Additional Objective Labs: Basic Metabolic Panel: Recent Labs  Lab 09/27/18 0759 09/28/18 0518 09/29/18 0400  NA 136 136 134*  K 4.3 4.1 4.7  CL 101 102 101  CO2 24 24 22   GLUCOSE 102* 93 117*  BUN 38* 23 37*  CREATININE 5.61* 3.48* 5.16*  CALCIUM 8.8* 8.5* 8.9  PHOS 5.8* 3.1 2.8   Liver Function Tests: Recent Labs  Lab 09/22/18 1317  09/27/18 0759 09/28/18 0518 09/29/18 0400  AST 87*  --   --   --   --  ALT 31  --   --   --   --   ALKPHOS 49  --   --   --   --   BILITOT 2.4*  --   --   --   --   PROT 7.1  --   --   --   --   ALBUMIN 2.6*   < > 2.5* 2.4* 2.7*   < > = values in this interval not displayed.   No results for input(s): LIPASE, AMYLASE in the last 168 hours. CBC: Recent Labs  Lab 09/22/18 1317  09/23/18 2113 09/24/18 0356 09/25/18 0720 09/27/18 0759 09/29/18 0400  WBC 7.0   < > 10.3 8.7 7.1 6.9 15.3*  NEUTROABS 4.4  --   --   --   --   --   --   HGB 9.5*   < > 10.1* 9.8* 9.0* 8.6* 8.8*  HCT 33.4*   < > 32.6* 32.0* 29.2* 28.8* 28.9*  MCV 74.4*   < > 70.6* 70.0* 71.6*  72.0* 71.0*  PLT 173   < > 154 155 159 153 167   < > = values in this interval not displayed.   Blood Culture    Component Value Date/Time   SDES BLOOD RIGHT ANTECUBITAL 09/22/2018 2023   SDES BLOOD RIGHT HAND 09/22/2018 2023   SPECREQUEST  09/22/2018 2023    BOTTLES DRAWN AEROBIC ONLY Blood Culture results may not be optimal due to an excessive volume of blood received in culture bottles   SPECREQUEST  09/22/2018 2023    BOTTLES DRAWN AEROBIC ONLY Blood Culture adequate volume   CULT  09/22/2018 2023    NO GROWTH 5 DAYS Performed at [facility], [place] [address], [place], [place] [address]    CULT  09/22/2018 2023    NO GROWTH 5 DAYS Performed at [facility] [address] [place], [place] [address]    REPTSTATUS 09/27/2018 FINAL 09/22/2018 2023   REPTSTATUS 09/27/2018 FINAL 09/22/2018 2023    Cardiac Enzymes: Recent Labs  Lab 09/22/18 2202 09/23/18 0550  TROPONINI 0.16* 0.16*   CBG: Recent Labs  Lab 09/28/18 1122 09/28/18 1619 09/28/18 2100 09/29/18 0734 09/29/18 0954  GLUCAP 116* 168* 113* 100* 106*   Iron Studies: No results for input(s): IRON, TIBC, TRANSFERRIN, FERRITIN in the last 72 hours. Lab Results  Component Value Date   INR 0.96 09/29/2018   INR 0.97 07/23/2018   INR 0.98 07/22/2018   Studies/Results: No results found. Medications: . sodium chloride 10 mL/hr at 09/29/18 0855  . [MAR Hold] ferric gluconate (FERRLECIT/NULECIT) IV Stopped (09/27/18 1145)   . [MAR Hold] amLODipine  10 mg Oral QHS  . [MAR Hold] calcitRIOL  0.25 mcg Oral Q T,Th,Sa-HD  . [MAR Hold] Chlorhexidine Gluconate Cloth  6 each Topical Q0600  . [MAR Hold] darbepoetin (ARANESP) injection - DIALYSIS  150 mcg Intravenous Q Sat-HD  . [MAR Hold] diphenhydrAMINE  25 mg Intravenous Once  . [MAR Hold] feeding supplement (PRO-STAT SUGAR FREE 64)  30 mL Oral BID  . [MAR Hold] fluconazole  200 mg Oral Daily  . [MAR Hold] heparin injection (subcutaneous)  5,000 Units  Subcutaneous Q8H  . [MAR Hold] insulin aspart  0-5 Units Subcutaneous QHS  . [MAR Hold] insulin aspart  0-9 Units Subcutaneous TID WC  . [MAR Hold] insulin glargine  5 Units Subcutaneous QHS  . [MAR Hold] multivitamin  1 tablet Oral QHS  . [MAR Hold] sodium chloride flush  3 mL Intravenous Q12H  . [MAR Hold] zolpidem  5 mg Oral QHS

## 2018-09-29 NOTE — Progress Notes (Signed)
Medicine attending: Examined this patient today together with resident physician Modena Nunnery and I concur with her evaluation and management plan.  We saw the patient briefly preop and then again postop in the recovery room.  She had a stage II procedure on a left proximal arm AV fistula.  She continues her routine dialysis schedule via a temporary tunneled vascular catheter. She is developed a unexplained rise in her white blood count.  She is afebrile and in fact she is mildly hypothermic.  She is not on steroids.  No localizing physical findings.  We will continue to monitor.  Inspect skin for any decubitus ulcers.  Evaluate vascular catheter site daily. She has newly diagnosed and not yet treated hepatitis C.  However, if anything, this would be more commonly associated with leukopenia.

## 2018-09-29 NOTE — Op Note (Signed)
    OPERATIVE NOTE   PROCEDURE: left second stage basilic vein transposition (brachiobasilic arteriovenous fistula) placement  PRE-OPERATIVE DIAGNOSIS: ESRD  POST-OPERATIVE DIAGNOSIS: same  SURGEON: Marty Heck, MD  ASSISTANT(S): Arlee Muslim  ANESTHESIA: LMA  ESTIMATED BLOOD LOSS: Minimal  FINDING(S): Left basilic vein mobilization with nicely dilated basilic vein >5 mm.  After vein was transected and tunneled, new vein anastomosis performed end to end with good thrill.  SPECIMEN(S):  None  INDICATIONS:   Rhonda Lynch is a 63 y.o. female who presents with ESRD after first stage left brachiobasilic fistula placement.  The patient is scheduled for left second stage basilic vein transposition.  The patient is aware the risks include but are not limited to: bleeding, infection, steal syndrome, nerve damage, ischemic monomelic neuropathy, failure to mature, and need for additional procedures.  The patient is aware of the risks of the procedure and elects to proceed forward.   DESCRIPTION: After full informed written consent was obtained from the patient, the patient was brought back to the operating room and placed supine upon the operating table.  Prior to induction, the patient received IV antibiotics.   After obtaining adequate anesthesia, the patient was then prepped and draped in the standard fashion for a left arm access procedure.  I turned my attention first to identifying the patient's brachiobasilic arteriovenous fistula.  Using SonoSite guidance, the location of this fistula was marked out on the skin.    This was an excellent caliber vein.  I made three longitudinal incisions on the medial aspect of the left upper arm.  Through these incisions I dissected out circumferentially the basilic vein, taking care to protect the nerve.  Once the vein was fully mobilized, all side branches were ligated between 3-0 silk ties and small clips and transected.  The vein was marked  for orientation.  I then used a curved tunneler to create a subcutaneous tunnel.  The vein was then transected near the antecubital crease. I then passed a #5 dilator proximally with no resistance all the way into the axillary vein. It was then brought to the previously created tunnel making sure to maintain proper orientation.  A primary anastomosis was then performed between the two cut ends of the vein with a running 6-0 Prolene after each end was spatulated to prevent stenosis.  Once this was done the clamps were released.  There was excellent flow through the fistula.  Hemostasis was then achieved.  The wound was irrigated.  The incisions were closed with a deep layer of 3-0 Vicryl followed by a subcutaneous 4-0 Monocryl and Dermabond.  There were no immediate complications.  COMPLICATIONS: None  CONDITION: Stable  Marty Heck, MD [facility] [place] Office: [phone] Pager: [phone]  09/29/2018, 12:10 PM

## 2018-09-30 ENCOUNTER — Encounter (HOSPITAL_COMMUNITY): Payer: Self-pay | Admitting: Vascular Surgery

## 2018-09-30 DIAGNOSIS — B1920 Unspecified viral hepatitis C without hepatic coma: Secondary | ICD-10-CM

## 2018-09-30 LAB — GLUCOSE, CAPILLARY
Glucose-Capillary: 172 mg/dL — ABNORMAL HIGH (ref 70–99)
Glucose-Capillary: 166 mg/dL — ABNORMAL HIGH (ref 70–99)
Glucose-Capillary: 170 mg/dL — ABNORMAL HIGH (ref 70–99)

## 2018-09-30 LAB — HIV ANTIBODY (ROUTINE TESTING W REFLEX): HIV SCREEN 4TH GENERATION: NONREACTIVE

## 2018-09-30 LAB — CBC
HCT: 25.9 % — ABNORMAL LOW (ref 36.0–46.0)
Hemoglobin: 8.1 g/dL — ABNORMAL LOW (ref 12.0–15.0)
MCH: 22.6 pg — ABNORMAL LOW (ref 26.0–34.0)
MCHC: 31.3 g/dL (ref 30.0–36.0)
MCV: 72.3 fL — ABNORMAL LOW (ref 80.0–100.0)
Platelets: 181 10*3/uL (ref 150–400)
RBC: 3.58 MIL/uL — ABNORMAL LOW (ref 3.87–5.11)
RDW: 22.5 % — AB (ref 11.5–15.5)
WBC: 12.8 10*3/uL — ABNORMAL HIGH (ref 4.0–10.5)
nRBC: 0 % (ref 0.0–0.2)

## 2018-09-30 LAB — RENAL FUNCTION PANEL
Albumin: 2.6 g/dL — ABNORMAL LOW (ref 3.5–5.0)
Anion gap: 12 (ref 5–15)
BUN: 55 mg/dL — AB (ref 8–23)
CO2: 21 mmol/L — ABNORMAL LOW (ref 22–32)
Calcium: 8.8 mg/dL — ABNORMAL LOW (ref 8.9–10.3)
Chloride: 98 mmol/L (ref 98–111)
Creatinine, Ser: 6.51 mg/dL — ABNORMAL HIGH (ref 0.44–1.00)
GFR calc Af Amer: 7 mL/min — ABNORMAL LOW (ref >60)
GFR calc non Af Amer: 6 mL/min — ABNORMAL LOW (ref >60)
Glucose, Bld: 234 mg/dL — ABNORMAL HIGH (ref 70–99)
PHOSPHORUS: 3.1 mg/dL (ref 2.5–4.6)
Potassium: 5.4 mmol/L — ABNORMAL HIGH (ref 3.5–5.1)
Sodium: 131 mmol/L — ABNORMAL LOW (ref 135–145)

## 2018-09-30 LAB — HEPATITIS C GENOTYPE

## 2018-09-30 MED ORDER — HEPARIN SODIUM (PORCINE) 1000 UNIT/ML IJ SOLN
INTRAMUSCULAR | Status: AC
  Administered 2018-09-30: 1200 [IU] via INTRAVENOUS_CENTRAL
  Filled 2018-09-30: qty 2

## 2018-09-30 MED ORDER — HEPARIN SODIUM (PORCINE) 1000 UNIT/ML IJ SOLN
INTRAMUSCULAR | Status: AC
  Administered 2018-09-30: 1000 [IU] via INTRAVENOUS_CENTRAL
  Filled 2018-09-30: qty 4

## 2018-09-30 MED ORDER — OXYCODONE HCL 5 MG PO TABS
5.0000 mg | ORAL_TABLET | Freq: Four times a day (QID) | ORAL | Status: DC | PRN
  Administered 2018-09-30 – 2018-10-03 (×7): 5 mg via ORAL
  Filled 2018-09-30 (×7): qty 1

## 2018-09-30 MED ORDER — HYDROXYZINE HCL 25 MG PO TABS
25.0000 mg | ORAL_TABLET | Freq: Once | ORAL | Status: AC
  Administered 2018-09-30: 25 mg via ORAL
  Filled 2018-09-30: qty 1

## 2018-09-30 MED ORDER — CALCITRIOL 0.25 MCG PO CAPS
ORAL_CAPSULE | ORAL | Status: AC
  Filled 2018-09-30: qty 1

## 2018-09-30 MED ORDER — HEPARIN SODIUM (PORCINE) 1000 UNIT/ML DIALYSIS
1000.0000 [IU] | INTRAMUSCULAR | Status: DC | PRN
  Administered 2018-09-30: 1000 [IU] via INTRAVENOUS_CENTRAL
  Filled 2018-09-30: qty 1

## 2018-09-30 MED ORDER — PENTAFLUOROPROP-TETRAFLUOROETH EX AERO: 1.0000 "application " | INHALATION_SPRAY | CUTANEOUS | Status: DC | PRN

## 2018-09-30 MED ORDER — LIDOCAINE-PRILOCAINE 2.5-2.5 % EX CREA: 1.0000 "application " | TOPICAL_CREAM | CUTANEOUS | Status: DC | PRN

## 2018-09-30 MED ORDER — SODIUM CHLORIDE 0.9 % IV SOLN: 100.0000 mL | INTRAVENOUS | Status: DC | PRN

## 2018-09-30 MED ORDER — ALTEPLASE 2 MG IJ SOLR: 2.0000 mg | Freq: Once | INTRAMUSCULAR | Status: DC | PRN

## 2018-09-30 MED ORDER — FLUCONAZOLE 100 MG PO TABS
200.0000 mg | ORAL_TABLET | Freq: Every day | ORAL | Status: DC
  Administered 2018-10-01 – 2018-10-12 (×12): 200 mg via ORAL
  Filled 2018-09-30 (×13): qty 2

## 2018-09-30 MED ORDER — HEPARIN SODIUM (PORCINE) 1000 UNIT/ML DIALYSIS
20.0000 [IU]/kg | INTRAMUSCULAR | Status: DC | PRN
  Administered 2018-09-30: 1200 [IU] via INTRAVENOUS_CENTRAL
  Filled 2018-09-30: qty 1.2

## 2018-09-30 MED ORDER — LIDOCAINE HCL (PF) 1 % IJ SOLN: 5.0000 mL | INTRAMUSCULAR | Status: DC | PRN

## 2018-09-30 NOTE — Progress Notes (Signed)
PT Cancellation Note  Patient Details Name: Rhonda Lynch MRN: [id number] DOB: [birth year]   Cancelled Treatment:    Reason Eval/Treat Not Completed: Patient declined, no reason specified Made multiple attempts to see patient today; on first attempt she had just returned from HD and requested to eat lunch prior to PT. On second attempt, patient up in chair but very emotional/crying and states "I'm just going through some changes", politely declines PT stating "I am not up to it right now". Will attempt to return on next day of service.    Deniece Ree PT, DPT, CBIS  Supplemental Physical Therapist [facility]    Pager [phone] Acute Rehab Office [phone]

## 2018-09-30 NOTE — Progress Notes (Addendum)
[facility] Progress Note   Dialysis Orders: **She does NOT have an active HD unit, will need to undergo CLIP process again** Prev dialyzed at [facility] (4hr, EDW 59kg, 2K/2.25Ca, TDC, no heparin) DC orders - reduce time to 3.5 hours, use tight heparin  Assessment/Plan: 1.Hyperkalemia: d/t missed HD K up to 5.4 but it is Tuesday and glu up some 2. Uremia - due to missed HD-resolved with adequate HD 3. ESRD -HD 1/13, 1/14. CLIP pending, doubtful previous clinic will accept her back due to non compliance.Now running TTS here- plans pending - HD today on schedule -on heparin here - not sue why noheppreviously at output unit will resume low dose at discharge . Has some degree of renal function. Will set time at 3.5 hours going forward per discussion with Dr. Moshe Cipro. 4. Anemia of CKD with Fe deficiency-Hgb 8.8 > 8.1 - continue to trend Low Tsat, startedFe load.-on ESA started at 40 received 1/18-  had been off ESA over 2 months. Will take a while before it trends up 5. Secondary hyperparathyroidism -Ca/P ok, PTH 211 on low dose calcitriol. Follow trends.No binder yet 6. HTN/volume -BP in goal some what variable,Norvasc 10 mgnightlySet EDW~60post wt 60.6 essentially kept even last two treatments. Wt 63 pre HD today - much lower during HD today then yesterdays BP  7. Nutrition -Alb 3.1 Renal diet w/fluid restrictions. Protein supplements, vitamins. 8. 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  9. Dialysis access - L IJ TDC,. S/p L stage 2nd stage 1/20Appreciate VVS help  10. Hep C positive- active infection - per primary 11. 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 Low grade temp noted- asymptomatic 12 Dispo- CLIP pending turned down by [place] and [facility] due to prior noncompliance.  CLIP sending out info to other units. Should be ready from a renal perspective for d/c  once outpatient HD unit is secured.   Myriam Jacobson, PA-C [place] 09/30/2018,8:43 AM  LOS: 8 days   Pt seen, examined and agree w A/P as above.  Kelly Splinter MD [facility] pager [phone]   09/30/2018, 11:49 AM    Subjective:   Some forearm numbness - no other c/o  Objective Vitals:   09/30/18 0725 09/30/18 0730 09/30/18 0800 09/30/18 0830  BP: 115/70 (!) 106/98 103/70 (!) 124/93  Pulse: 85 73 (!) 104 78  Resp:      Temp:      TempSrc:      SpO2:      Weight:      Height:       Physical Exam goal 2 l General: NAD on HD Heart: RRR Lungs: no rales anteriorly Abdomen: soft NTND + BS Extremities: no sig LE edema Dialysis Access: left upper AVF s/p 2nd stage BVT -mild edema + bruit and right IJ [facility]   Additional Objective Labs: Basic Metabolic Panel: Recent Labs  Lab 09/28/18 0518 09/29/18 0400 09/30/18 0416  NA 136 134* 131*  K 4.1 4.7 5.4*  CL 102 101 98  CO2 24 22 21*  GLUCOSE 93 117* 234*  BUN 23 37* 55*  CREATININE 3.48* 5.16* 6.51*  CALCIUM 8.5* 8.9 8.8*  PHOS 3.1 2.8 3.1   Liver Function Tests: Recent Labs  Lab 09/28/18 0518 09/29/18 0400 09/30/18 0416  ALBUMIN 2.4* 2.7* 2.6*   No results for input(s): LIPASE, AMYLASE in the last 168 hours. CBC: Recent Labs  Lab 09/24/18  0174 09/25/18 0720 09/27/18 0759 09/29/18 0400 09/30/18 0416  WBC 8.7 7.1 6.9 15.3* 12.8*  HGB 9.8* 9.0* 8.6* 8.8* 8.1*  HCT 32.0* 29.2* 28.8* 28.9* 25.9*  MCV 70.0* 71.6* 72.0* 71.0* 72.3*  PLT 155 159 153 167 181   Blood Culture    Component Value Date/Time   SDES BLOOD RIGHT ANTECUBITAL 09/22/2018 2023   SDES BLOOD RIGHT HAND 09/22/2018 2023   SPECREQUEST  09/22/2018 2023    BOTTLES DRAWN AEROBIC ONLY Blood Culture results may not be optimal due to an excessive volume of blood received in culture bottles   SPECREQUEST  09/22/2018 2023    BOTTLES DRAWN AEROBIC ONLY Blood Culture adequate volume   CULT   09/22/2018 2023    NO GROWTH 5 DAYS Performed at [facility], [place] [address] [place], [place] [address]    CULT  09/22/2018 2023    NO GROWTH 5 DAYS Performed at [place] [address] [place], [place] [address]    REPTSTATUS 09/27/2018 FINAL 09/22/2018 2023   REPTSTATUS 09/27/2018 FINAL 09/22/2018 2023    Cardiac Enzymes: No results for input(s): CKTOTAL, CKMB, CKMBINDEX, TROPONINI in the last 168 hours. CBG: Recent Labs  Lab 09/29/18 0954 09/29/18 1251 09/29/18 1450 09/29/18 1645 09/29/18 2034  GLUCAP 106* 157* 167* 329* 329*   Iron Studies: No results for input(s): IRON, TIBC, TRANSFERRIN, FERRITIN in the last 72 hours. Lab Results  Component Value Date   INR 0.96 09/29/2018   INR 0.97 07/23/2018   INR 0.98 07/22/2018   Studies/Results: No results found. Medications: . sodium chloride 10 mL/hr at 09/29/18 0855  . sodium chloride    . sodium chloride    . ferric gluconate (FERRLECIT/NULECIT) IV Stopped (09/27/18 1145)   . amLODipine  10 mg Oral QHS  . calcitRIOL  0.25 mcg Oral Q T,Th,Sa-HD  . Chlorhexidine Gluconate Cloth  6 each Topical Q0600  . [START ON 10/04/2018] darbepoetin (ARANESP) injection - DIALYSIS  40 mcg Intravenous Q Sat-HD  . diphenhydrAMINE  25 mg Intravenous Once  . feeding supplement (PRO-STAT SUGAR FREE 64)  30 mL Oral BID  . fluconazole  200 mg Oral Daily  . heparin      . heparin injection (subcutaneous)  5,000 Units Subcutaneous Q8H  . insulin aspart  0-5 Units Subcutaneous QHS  . insulin aspart  0-9 Units Subcutaneous TID WC  . insulin glargine  5 Units Subcutaneous QHS  . multivitamin  1 tablet Oral QHS  . sodium chloride flush  3 mL Intravenous Q12H  . zolpidem  5 mg Oral QHS

## 2018-09-30 NOTE — Progress Notes (Signed)
Pt. Noted with minimal serous drainage from left AVF site swollen and  With shooting pain, Dr. Jonnie Finner made aware saw pt. At bedside during HD treatment no new orders.

## 2018-09-30 NOTE — Progress Notes (Signed)
   Subjective: Rhonda Lynch was seen and evaluated in HD this morning. She states she had a very good night sleep and is feeling well. Denies headache, n/v, abdominal pain. She is eager to go home before her [birth year] on [birth year] 27[birth year]. Discussed we will continue to wait for HD outpatient center placement.   Objective:  Vital signs in last 24 hours: Vitals:   09/30/18 0800 09/30/18 0830 09/30/18 0900 09/30/18 0930  BP: 103/70 (!) 124/93 109/69 109/66  Pulse: (!) 104 78 99 95  Resp:      Temp:      TempSrc:      SpO2:      Weight:      Height:       General: seen in HD, eating breakfast, doing well, in NAD CV: RRR, no mrg  Pulm: normal respiratory effort; lungs CTA bilaterally Ext: no edema of LE, AVF on LUE w/o signs of infection and palpable thrill   Assessment/Plan:  Principal Problem:   ESRD (end stage renal disease) (HCC) Active Problems:   Microcytic anemia   AKI (acute kidney injury) ([place])   Hypertension   Abdominal pain   Type 2 diabetes mellitus (HCC)   Hepatitis C infection  Rhonda Lynch is a 63 yo female with a hx significant for 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 Undergoing CLIP process now.  1.ESRD on Dialysis 2/2 ANCA Vasculitis   Hyperkalemia - Stable - Appreciate nephrology managing inpatient dialysis; undergoing CLIP process which they are hopeful will be complete early next week - s/p POD1 2nd stage LUE AVF, no complications and doing well   2. Leukocytosis: Improved; No signs of infection.   3.HTN -blood pressures stable on amlodipine.   4.Type 2 DM: - CBGs well controlled on Lantus 5 and SSI  5.Anemia of CKD - hgb has remained stable - iron studies are on low end of normal - loaded with  iron and started on ESA per nephrology    6. Active Hep C Infection: - genotyping pending - follow-up in Hep C clinic outpatient  - denies risk factors; HIV pending   7. 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 She was turned down by 2 centers due to prior noncompliance. CLIP now reaching out to other units.   Modena Nunnery D, DO 09/30/2018, 9:51 AM Pager: [phone]

## 2018-09-30 NOTE — Progress Notes (Signed)
[facility]  Subjective  - Some mild numbness in left forearm she states is stable since before surgery.  Seen in dialysis.   Objective 103/70 (!) 104 98 F (36.7 C) (Oral) 18 96%  Intake/Output Summary (Last 24 hours) at 09/30/2018 0809 Last data filed at 09/30/2018 0600 Gross per 24 hour  Intake 1150 ml  Output 0 ml  Net 1150 ml    Left brachiobasilic fistula with palpable thrill Left radial pulse palpable Left hand warm and motor intact  Laboratory Lab Results: Recent Labs    09/29/18 0400 09/30/18 0416  WBC 15.3* 12.8*  HGB 8.8* 8.1*  HCT 28.9* 25.9*  PLT 167 181   BMET Recent Labs    09/29/18 0400 09/30/18 0416  NA 134* 131*  K 4.7 5.4*  CL 101 98  CO2 22 21*  GLUCOSE 117* 234*  BUN 37* 55*  CREATININE 5.16* 6.51*  CALCIUM 8.9 8.8*    COAG Lab Results  Component Value Date   INR 0.96 09/29/2018   INR 0.97 07/23/2018   INR 0.98 07/22/2018   No results found for: PTT  Assessment/Planning: Doing well POD#1 s/p left second stage brachiobasilic AVF.  Good thrill and palpable radial pulse.  No steal symptoms.  Call vascular if questions or concerns develop.  Marty Heck 09/30/2018 8:09 AM --

## 2018-10-01 LAB — GLUCOSE, CAPILLARY
GLUCOSE-CAPILLARY: 171 mg/dL — AB (ref 70–99)
GLUCOSE-CAPILLARY: 130 mg/dL — AB (ref 70–99)
Glucose-Capillary: 98 mg/dL (ref 70–99)
Glucose-Capillary: 92 mg/dL (ref 70–99)

## 2018-10-01 MED ORDER — HYDROXYZINE HCL 25 MG PO TABS
25.0000 mg | ORAL_TABLET | Freq: Once | ORAL | Status: AC
  Administered 2018-10-01: 25 mg via ORAL
  Filled 2018-10-01: qty 1

## 2018-10-01 MED ORDER — CHLORHEXIDINE GLUCONATE CLOTH 2 % EX PADS
6.0000 | MEDICATED_PAD | Freq: Every day | CUTANEOUS | Status: DC
  Administered 2018-10-01 – 2018-10-07 (×7): 6 via TOPICAL

## 2018-10-01 MED ORDER — AMLODIPINE BESYLATE 5 MG PO TABS
5.0000 mg | ORAL_TABLET | Freq: Every day | ORAL | Status: DC
  Administered 2018-10-01 – 2018-10-12 (×11): 5 mg via ORAL
  Filled 2018-10-01 (×12): qty 1

## 2018-10-01 NOTE — Progress Notes (Signed)
Physical Therapy Treatment Patient Details Name: Rhonda Lynch MRN: [id number] DOB: [birth year] Today's Date: 10/01/2018    History of Present Illness Ms. 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    PT Comments    Pt received up at EOB playing bingo on tablet device. Pt agreeable to treatment. AMB >615f performed, no LOB or frank gait instability. Pt denies fatigue, and appears to require minimal effort only. Pt verbalizes being at baseline level of function for mobility. All education completed on remaining active and preventing functional decline, and time is given to address all questions/concerns. No additional skilled PT services needed at this time, PT signing off. PT recommends daily ambulation ad lib or with nursing staff as needed to prevent deconditioning.     Follow Up Recommendations  No PT follow up     Equipment Recommendations  None recommended by PT    Recommendations for Other Services       Precautions / Restrictions Precautions Precautions: None Restrictions Weight Bearing Restrictions: No    Mobility  Bed Mobility               General bed mobility comments: received at EOB   Transfers Overall transfer level: Independent                  Ambulation/Gait Ambulation/Gait assistance: Independent Gait Distance (Feet): 600 Feet Assistive device: None Gait Pattern/deviations: Step-through pattern;WFL(Within Functional Limits) Gait velocity: 0.854m  Gait velocity interpretation: >2.62 ft/sec, indicative of community ambulatory     Stairs             Wheelchair Mobility    Modified Rankin (Stroke Patients Only)       Balance Overall balance assessment:  Independent                                          Cognition Arousal/Alertness: Awake/alert Behavior During Therapy: WFL for tasks assessed/performed Overall Cognitive Status: Within Functional Limits for tasks assessed                                        Exercises      General Comments        Pertinent Vitals/Pain Pain Assessment: 0-10 Pain Score: 6  Pain Location: Left forearm  Pain Intervention(s): Limited activity within patient's tolerance    Home Living                      Prior Function            PT Goals (current goals can now be found in the care plan section) Acute Rehab PT Goals Patient Stated Goal: to get home PT Goal Formulation: With patient Time For Goal Achievement: 10/07/18 Potential to Achieve Goals: Good Progress towards PT goals: Goals met/education completed, patient discharged from PT    Frequency    Min 3X/week      PT Plan Current plan remains appropriate    Co-evaluation              AM-PAC PT "6  Clicks" Mobility   Outcome Measure  Help needed turning from your back to your side while in a flat bed without using bedrails?: None Help needed moving from lying on your back to sitting on the side of a flat bed without using bedrails?: None Help needed moving to and from a bed to a chair (including a wheelchair)?: None Help needed standing up from a chair using your arms (e.g., wheelchair or bedside chair)?: None Help needed to walk in hospital room?: None Help needed climbing 3-5 steps with a railing? : None 6 Click Score: 24    End of Session   Activity Tolerance: Patient tolerated treatment well;No increased pain Patient left: in bed;with call bell/phone within reach(seated EOB ) Nurse Communication: Mobility status PT Visit Diagnosis: Muscle weakness (generalized) (M62.81)     Time: 1419-1430 PT Time Calculation (min) (ACUTE ONLY): 11 min  Charges:  $Therapeutic  Activity: 8-22 mins                 2:38 PM, 10/01/18 Etta Grandchild, PT, DPT Physical Therapist - [place] [phone] (Pager)  [phone] (Office)      , C 10/01/2018, 2:36 PM

## 2018-10-01 NOTE — Progress Notes (Signed)
   Subjective: Rhonda Lynch was seen in her room this morning and when asked about why she was anxious yesterday, she stated that she was really worried about where she was going to stay when she left the hospital. She states that she is living with a friend but it is not a good situation and she thinks that she would be better off on her own. When I asked if she could stay with her son and she said that "he asks like her dad" and she cant live with him. She asks for help from a social worker to talk about housing arrangements.  Otherwise feeling well without headache, n/v, or shortness of breath.   Objective:  Vital signs in last 24 hours: Vitals:   09/30/18 1652 09/30/18 2120 10/01/18 0518 10/01/18 0857  BP: 100/65 114/80 106/74 109/68  Pulse: 93 87 81 85  Resp: 18 17 18 18   Temp: 98.8 F (37.1 C) 99 F (37.2 C) 99.2 F (37.3 C) 98.5 F (36.9 C)  TempSrc: Oral Oral Oral Oral  SpO2: 100% 100% 100% 99%  Weight:  62.3 kg    Height:       General: seen in room, resting comfortably in bed, tearful at times CV: RRR, no mrg  Pulm: normal respiratory effort; lungs CTA bilaterally Ext: no edema of LE  Assessment/Plan:  Principal Problem:   ESRD (end stage renal disease) (HCC) Active Problems:   Microcytic anemia   Hypertension   Abdominal pain   Type 2 diabetes mellitus (HCC)   Chronic hepatitis C virus infection ([place])  Ms. 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 Undergoing CLIP process now.  1.ESRD on Dialysis 2/2 ANCA Vasculitis   Hyperkalemia - Stable - Appreciate nephrology managing inpatient dialysis; undergoing CLIP process which they are hopeful will be complete early  next week -HD tomorrow   2.HTN -blood pressures stable on amlodipine.   3.Type 2 DM: - CBGs well controlled on Lantus 5 and SSI  4.Anemia of CKD - hgb has remained stable - iron studies are on low end of normal - loaded with iron and started on ESA per nephrology    5. Active Hep C Infection: - genotype 1b - follow-up in Hep C clinic outpatient  - denies risk factors; HIV non-reactive  7. 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 She was turned down by 2 centers due to prior noncompliance. CLIP now reaching out to other units. Have also consulted social work for housing concerns.    Synetta Shadow, Medical Student 10/01/2018, 11:29 AM Pager: [phone]  Attestation for Student Documentation:  I personally was present and performed or re-performed the history, physical exam and medical decision-making activities of this service and have verified that the service and findings are accurately documented in the student's note.  Modena Nunnery D, DO 10/01/2018, 3:34 PM

## 2018-10-01 NOTE — Progress Notes (Addendum)
[facility] Progress Note   Dialysis Orders: **She does NOT have an active HD unit, will need to undergo CLIP process again** Prev dialyzed at [facility] (4hr, EDW 59kg, 2K/2.25Ca, TDC, no heparin) DC orders - reduce time to 3.5 hours, use tight heparin  Assessment/Plan: 1.Hyperkalemia: d/t missed HD K up to 5.4 Tuesday - higher due to weekend 2. Uremia - due to missed HD-resolved with adequate HD 3. ESRD -HD 1/13, 1/14. CLIP pending,  previous clinic will not accept her back due to non compliance.Now running TTS here- on heparin here - not sure why noheppreviously at output unit will resume low dose at discharge . Has some degree of renal function. Will set time at 3.5 hours going forward per discussion with Dr. Moshe Cipro. HD tomorrow 4. Anemia of CKD with Fe deficiency-Hgb 8.8 > 8.1 - continue to trend Low Tsat, startedFe load.-on ESA started at 38 received 1/18-  had been off ESA over 2 months. Will take a while before it trends up 5. Secondary hyperparathyroidism -Ca/P ok, PTH 211 on low dose calcitriol. Follow trends.No binder yet 6. HTN/volume -BP in goal some what variable,Norvasc 10 mgnightly EDW~60post wt 60.6 essentially kept even last two treatments. Wt 63 pre HD Monday with net UF 1.5 - post wt 61.7 - reduce amlodipine to 5 mg at HS -  7. Nutrition -Alb 2.6  Renal diet w/fluid restrictions. Protein supplements, vitamins.- eating well - could be low in part due to liver disease; ate all of breakfast. 8. 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  9. Dialysis access - L IJ TDC,. S/p L stage 2nd stage 1/20Appreciate VVS help  10. Hep C positive- active infection - per primary 11. 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 Low grade temp noted- asymptomatic 12 Dispo- CLIP pending turned down by [place] and [facility] due to prior noncompliance.  CLIP sending out info to  other units. Should be ready from a renal perspective for d/c once outpatient HD unit is secured.   Myriam Jacobson, PA-C [place] 10/01/2018,9:55 AM  LOS: 9 days   Pt seen, examined and agree w A/P as above.  Kelly Splinter MD [facility] pager [phone]   10/01/2018, 12:12 PM    Subjective:   No prob with HD Tuesday - slight weeping from incision of AVF  Objective Vitals:   09/30/18 1652 09/30/18 2120 10/01/18 0518 10/01/18 0857  BP: 100/65 114/80 106/74 109/68  Pulse: 93 87 81 85  Resp: 18 17 18 18   Temp: 98.8 F (37.1 C) 99 F (37.2 C) 99.2 F (37.3 C) 98.5 F (36.9 C)  TempSrc: Oral Oral Oral Oral  SpO2: 100% 100% 100% 99%  Weight:  62.3 kg    Height:       Physical Exam  General: NAD sitting in bed playing bingo on phone Heart: RRR Lungs: no rales anteriorly Abdomen: soft NTND + BS Extremities: no sig LE edema Dialysis Access: left upper AVF s/p 2nd stage BVT -mild edema + bruit and right IJ [facility]   Additional Objective Labs: Basic Metabolic Panel: Recent Labs  Lab 09/28/18 0518 09/29/18 0400 09/30/18 0416  NA 136 134* 131*  K 4.1 4.7 5.4*  CL 102 101 98  CO2 24 22 21*  GLUCOSE 93 117* 234*  BUN 23 37* 55*  CREATININE 3.48* 5.16* 6.51*  CALCIUM 8.5* 8.9 8.8*  PHOS 3.1 2.8 3.1  Liver Function Tests: Recent Labs  Lab 09/28/18 0518 09/29/18 0400 09/30/18 0416  ALBUMIN 2.4* 2.7* 2.6*   No results for input(s): LIPASE, AMYLASE in the last 168 hours. CBC: Recent Labs  Lab 09/25/18 0720 09/27/18 0759 09/29/18 0400 09/30/18 0416  WBC 7.1 6.9 15.3* 12.8*  HGB 9.0* 8.6* 8.8* 8.1*  HCT 29.2* 28.8* 28.9* 25.9*  MCV 71.6* 72.0* 71.0* 72.3*  PLT 159 153 167 181   Blood Culture    Component Value Date/Time   SDES BLOOD RIGHT ANTECUBITAL 09/22/2018 2023   SDES BLOOD RIGHT HAND 09/22/2018 2023   SPECREQUEST  09/22/2018 2023    BOTTLES DRAWN AEROBIC ONLY Blood Culture results may not be  optimal due to an excessive volume of blood received in culture bottles   SPECREQUEST  09/22/2018 2023    BOTTLES DRAWN AEROBIC ONLY Blood Culture adequate volume   CULT  09/22/2018 2023    NO GROWTH 5 DAYS Performed at [facility], [place] [address] [place], [place] [address]    CULT  09/22/2018 2023    NO GROWTH 5 DAYS Performed at [facility] [address], [place], [place] [address]    REPTSTATUS 09/27/2018 FINAL 09/22/2018 2023   REPTSTATUS 09/27/2018 FINAL 09/22/2018 2023    Cardiac Enzymes: No results for input(s): CKTOTAL, CKMB, CKMBINDEX, TROPONINI in the last 168 hours. CBG: Recent Labs  Lab 09/29/18 2034 09/30/18 1131 09/30/18 1651 09/30/18 2118 10/01/18 0724  GLUCAP 329* 172* 166* 170* 171*   Iron Studies: No results for input(s): IRON, TIBC, TRANSFERRIN, FERRITIN in the last 72 hours. Lab Results  Component Value Date   INR 0.96 09/29/2018   INR 0.97 07/23/2018   INR 0.98 07/22/2018   Studies/Results: No results found. Medications: . sodium chloride 10 mL/hr at 09/29/18 0855  . ferric gluconate (FERRLECIT/NULECIT) IV Stopped (09/30/18 1129)   . amLODipine  10 mg Oral QHS  . calcitRIOL  0.25 mcg Oral Q T,Th,Sa-HD  . Chlorhexidine Gluconate Cloth  6 each Topical Q0600  . [START ON 10/04/2018] darbepoetin (ARANESP) injection - DIALYSIS  40 mcg Intravenous Q Sat-HD  . diphenhydrAMINE  25 mg Intravenous Once  . feeding supplement (PRO-STAT SUGAR FREE 64)  30 mL Oral BID  . fluconazole  200 mg Oral Daily  . heparin injection (subcutaneous)  5,000 Units Subcutaneous Q8H  . insulin aspart  0-5 Units Subcutaneous QHS  . insulin aspart  0-9 Units Subcutaneous TID WC  . insulin glargine  5 Units Subcutaneous QHS  . multivitamin  1 tablet Oral QHS  . sodium chloride flush  3 mL Intravenous Q12H  . zolpidem  5 mg Oral QHS

## 2018-10-02 DIAGNOSIS — D72829 Elevated white blood cell count, unspecified: Secondary | ICD-10-CM

## 2018-10-02 DIAGNOSIS — M79602 Pain in left arm: Secondary | ICD-10-CM

## 2018-10-02 LAB — RENAL FUNCTION PANEL
Albumin: 2.5 g/dL — ABNORMAL LOW (ref 3.5–5.0)
Anion gap: 9 (ref 5–15)
BUN: 50 mg/dL — ABNORMAL HIGH (ref 8–23)
CO2: 22 mmol/L (ref 22–32)
Calcium: 8.3 mg/dL — ABNORMAL LOW (ref 8.9–10.3)
Chloride: 101 mmol/L (ref 98–111)
Creatinine, Ser: 6.01 mg/dL — ABNORMAL HIGH (ref 0.44–1.00)
GFR calc Af Amer: 8 mL/min — ABNORMAL LOW (ref >60)
GFR calc non Af Amer: 7 mL/min — ABNORMAL LOW (ref >60)
Glucose, Bld: 114 mg/dL — ABNORMAL HIGH (ref 70–99)
Phosphorus: 3.3 mg/dL (ref 2.5–4.6)
Potassium: 5 mmol/L (ref 3.5–5.1)
Sodium: 132 mmol/L — ABNORMAL LOW (ref 135–145)

## 2018-10-02 LAB — CBC
HCT: 26.4 % — ABNORMAL LOW (ref 36.0–46.0)
Hemoglobin: 7.9 g/dL — ABNORMAL LOW (ref 12.0–15.0)
MCH: 21.9 pg — ABNORMAL LOW (ref 26.0–34.0)
MCHC: 29.9 g/dL — ABNORMAL LOW (ref 30.0–36.0)
MCV: 73.3 fL — ABNORMAL LOW (ref 80.0–100.0)
Platelets: 233 10*3/uL (ref 150–400)
RBC: 3.6 MIL/uL — ABNORMAL LOW (ref 3.87–5.11)
RDW: 22.8 % — ABNORMAL HIGH (ref 11.5–15.5)
WBC: 11.3 10*3/uL — ABNORMAL HIGH (ref 4.0–10.5)
nRBC: 0.4 % — ABNORMAL HIGH (ref 0.0–0.2)

## 2018-10-02 LAB — GLUCOSE, CAPILLARY
Glucose-Capillary: 74 mg/dL (ref 70–99)
Glucose-Capillary: 94 mg/dL (ref 70–99)
Glucose-Capillary: 88 mg/dL (ref 70–99)
Glucose-Capillary: 149 mg/dL — ABNORMAL HIGH (ref 70–99)

## 2018-10-02 MED ORDER — PENTAFLUOROPROP-TETRAFLUOROETH EX AERO: 1.0000 "application " | INHALATION_SPRAY | CUTANEOUS | Status: DC | PRN

## 2018-10-02 MED ORDER — VANCOMYCIN HCL 10 G IV SOLR
1500.0000 mg | Freq: Once | INTRAVENOUS | Status: AC
  Administered 2018-10-02: 1500 mg via INTRAVENOUS
  Filled 2018-10-02: qty 1500

## 2018-10-02 MED ORDER — ONDANSETRON HCL 4 MG/2ML IJ SOLN: 4.0000 mg | Freq: Four times a day (QID) | INTRAMUSCULAR | Status: DC | PRN

## 2018-10-02 MED ORDER — SODIUM CHLORIDE 0.9 % IV SOLN: 100.0000 mL | INTRAVENOUS | Status: DC | PRN

## 2018-10-02 MED ORDER — CALCITRIOL 0.25 MCG PO CAPS
ORAL_CAPSULE | ORAL | Status: AC
  Administered 2018-10-02: 0.25 ug via ORAL
  Filled 2018-10-02: qty 1

## 2018-10-02 MED ORDER — HEPARIN SODIUM (PORCINE) 1000 UNIT/ML IJ SOLN
INTRAMUSCULAR | Status: AC
  Administered 2018-10-02: 1200 [IU] via INTRAVENOUS_CENTRAL
  Filled 2018-10-02: qty 2

## 2018-10-02 MED ORDER — HEPARIN SODIUM (PORCINE) 1000 UNIT/ML DIALYSIS
20.0000 [IU]/kg | INTRAMUSCULAR | Status: DC | PRN
  Administered 2018-10-02: 1200 [IU] via INTRAVENOUS_CENTRAL

## 2018-10-02 MED ORDER — VANCOMYCIN HCL IN DEXTROSE 500-5 MG/100ML-% IV SOLN
500.0000 mg | INTRAVENOUS | Status: DC
  Filled 2018-10-02 (×2): qty 100

## 2018-10-02 MED ORDER — OXYCODONE HCL 5 MG PO TABS
ORAL_TABLET | ORAL | Status: AC
  Administered 2018-10-02: 5 mg via ORAL
  Filled 2018-10-02: qty 1

## 2018-10-02 MED ORDER — HYDROMORPHONE HCL 1 MG/ML IJ SOLN
0.2500 mg | Freq: Once | INTRAMUSCULAR | Status: AC
  Administered 2018-10-02: 0.25 mg via INTRAVENOUS
  Filled 2018-10-02: qty 1

## 2018-10-02 MED ORDER — DIPHENHYDRAMINE HCL 25 MG PO CAPS
50.0000 mg | ORAL_CAPSULE | Freq: Once | ORAL | Status: AC | PRN
  Administered 2018-10-03: 50 mg via ORAL
  Filled 2018-10-02: qty 2

## 2018-10-02 NOTE — Progress Notes (Signed)
[facility] Progress Note   Dialysis Orders: **She does NOT have an active HD unit, will need to undergo CLIP process again** Prev dialyzed at [facility] (4hr, EDW 59kg, 2K/2.25Ca, TDC, no heparin) DC orders - reduce time to 3.5 hours, use tight heparin  Assessment/Plan: 1.Hyperkalemia: d/t missed HD K up to 5.4 Tuesday - higher due to weekend 2. Uremia - due to missed HD-resolved with adequate HD 3. ESRD -HD 1/13, 1/14. CLIP pending,  previous clinic will not accept her back due to non compliance.Now running TTS here- on heparin here - not sure why noheppreviously at output unit will resume low dose at discharge . Has some degree of renal function. Will set time at 3.5 hours going forward per discussion with Dr. Moshe Cipro. HD tomorrow 4. Anemia of CKD with Fe deficiency-Hgb 8.8 > 8.1 - continue to trend Low Tsat, startedFe load.-on ESA started at 40 received 1/18-  had been off ESA over 2 months. Will take a while before it trends up 5. Secondary hyperparathyroidism -Ca/P ok, PTH 211 on low dose calcitriol. Follow trends.No binder yet 6. HTN/volume -BP in goal some what variable,Norvasc 10 mgnightly EDW~60post wt 60.6 essentially kept even last two treatments. Wt 63 pre HD Monday with net UF 1.5 - post wt 61.7 - reduce amlodipine to 5 mg at HS -  7. Nutrition -Alb 2.6  Renal diet w/fluid restrictions. Protein supplements, vitamins.- eating well - could be low in part due to liver disease; ate all of breakfast. 8. 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  9. Dialysis access - L IJ TDC,. S/p L stage 2nd stage 1/20Appreciate VVS help  10. Hep C positive- active infection - per primary 11. 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 Low grade temp noted- asymptomatic 12 Dispo- CLIP pending turned down by [place] and [facility] due to prior noncompliance.  CLIP sending out info to  other units. Should be ready from a renal perspective for d/c once outpatient HD unit is secured.   Myriam Jacobson, PA-C [facility] Beeper [phone] 10/02/2018,2:49 PM  LOS: 10 days   Pt seen, examined and agree w A/P as above.  Kelly Splinter MD [facility] pager [phone]   10/02/2018, 2:49 PM    Subjective:   No prob with HD Tuesday - slight weeping from incision of AVF  Objective Vitals:   10/02/18 1230 10/02/18 1300 10/02/18 1330 10/02/18 1400  BP: 136/74 112/62 134/66 130/67  Pulse: 88 86 92 91  Resp:      Temp:      TempSrc:      SpO2: 100%     Weight:      Height:       Physical Exam  General: NAD sitting in bed playing bingo on phone Heart: RRR Lungs: no rales anteriorly Abdomen: soft NTND + BS Extremities: no sig LE edema Dialysis Access: left upper AVF s/p 2nd stage BVT -mild edema + bruit and right IJ [facility]   Additional Objective Labs: Basic Metabolic Panel: Recent Labs  Lab 09/29/18 0400 09/30/18 0416 10/02/18 1200  NA 134* 131* 132*  K 4.7 5.4* 5.0  CL 101 98 101  CO2 22 21* 22  GLUCOSE 117* 234* 114*  BUN 37* 55* 50*  CREATININE 5.16* 6.51* 6.01*  CALCIUM 8.9 8.8* 8.3*  PHOS 2.8 3.1 3.3   Liver Function Tests: Recent Labs  Lab 09/29/18 0400 09/30/18 0416 10/02/18 1200  ALBUMIN 2.7* 2.6* 2.5*   No results for input(s): LIPASE, AMYLASE in the last 168 hours. CBC: Recent Labs  Lab 09/27/18 0759 09/29/18 0400 09/30/18 0416 10/02/18 1130  WBC 6.9 15.3* 12.8* 11.3*  HGB 8.6* 8.8* 8.1* 7.9*  HCT 28.8* 28.9* 25.9* 26.4*  MCV 72.0* 71.0* 72.3* 73.3*  PLT 153 167 181 233   Blood Culture    Component Value Date/Time   SDES BLOOD RIGHT ANTECUBITAL 09/22/2018 2023   SDES BLOOD RIGHT HAND 09/22/2018 2023   SPECREQUEST  09/22/2018 2023    BOTTLES DRAWN AEROBIC ONLY Blood Culture results may not be optimal due to an excessive volume of blood received in culture bottles   SPECREQUEST  09/22/2018 2023     BOTTLES DRAWN AEROBIC ONLY Blood Culture adequate volume   CULT  09/22/2018 2023    NO GROWTH 5 DAYS Performed at [facility], [place] [address] [place], [place] [address]    CULT  09/22/2018 2023    NO GROWTH 5 DAYS Performed at [facility] [address], [place], [place] [address]    REPTSTATUS 09/27/2018 FINAL 09/22/2018 2023   REPTSTATUS 09/27/2018 FINAL 09/22/2018 2023    Cardiac Enzymes: No results for input(s): CKTOTAL, CKMB, CKMBINDEX, TROPONINI in the last 168 hours. CBG: Recent Labs  Lab 10/01/18 1054 10/01/18 1609 10/01/18 2106 10/02/18 0725 10/02/18 1054  GLUCAP 130* 98 92 74 94   Iron Studies: No results for input(s): IRON, TIBC, TRANSFERRIN, FERRITIN in the last 72 hours. Lab Results  Component Value Date   INR 0.96 09/29/2018   INR 0.97 07/23/2018   INR 0.98 07/22/2018   Studies/Results: No results found. Medications: . sodium chloride 10 mL/hr at 09/29/18 0855  . sodium chloride    . sodium chloride    . ferric gluconate (FERRLECIT/NULECIT) IV Stopped (09/30/18 1129)   . amLODipine  5 mg Oral QHS  . calcitRIOL  0.25 mcg Oral Q T,Th,Sa-HD  . Chlorhexidine Gluconate Cloth  6 each Topical Q0600  . [START ON 10/04/2018] darbepoetin (ARANESP) injection - DIALYSIS  40 mcg Intravenous Q Sat-HD  . feeding supplement (PRO-STAT SUGAR FREE 64)  30 mL Oral BID  . fluconazole  200 mg Oral Daily  . heparin injection (subcutaneous)  5,000 Units Subcutaneous Q8H  . insulin aspart  0-5 Units Subcutaneous QHS  . insulin aspart  0-9 Units Subcutaneous TID WC  . insulin glargine  5 Units Subcutaneous QHS  . multivitamin  1 tablet Oral QHS  . sodium chloride flush  3 mL Intravenous Q12H  . zolpidem  5 mg Oral QHS

## 2018-10-02 NOTE — Progress Notes (Signed)
Subjective: Rhonda Lynch was seen in her room today and she c/o pain in her left arm around the surgical site. She endorses swelling and tenderness as well as feeling slightly "feverish" since late yesterday. She will go to dialysis today. Otherwise denies headache, n/v, or shortness of breath.   Objective:  Vital signs in last 24 hours: Vitals:   10/01/18 1608 10/01/18 2107 10/02/18 0501 10/02/18 0836  BP: 121/69 135/86 (!) 115/59 125/67  Pulse: 87 89 86 89  Resp: 20 18 18  (!) 22  Temp: 98.7 F (37.1 C) 98 F (36.7 C) 97.6 F (36.4 C) 99.3 F (37.4 C)  TempSrc: Oral   Oral  SpO2: 99% 100% 98% 100%  Weight:  62.3 kg    Height:       General: seen in room, sleeping comfortably in bed, tearful at times CV: RRR, no mrg  Pulm: normal respiratory effort; lungs CTA bilaterally Ext: no edema of LE, LUE with bandage over surgical site, dressing c/d/i, warmth and swelling around surgical site, swelling noted into the forearm, TTP around incisions and forearm.   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 Undergoing CLIP process now.  1.ESRD on Dialysis 2/2 ANCA Vasculitis   Hyperkalemia - Stable - Appreciate nephrology managing inpatient dialysis; undergoing CLIP process which they are hopeful will be complete early next week -HD today  2. Left arm pain/swelling: She is post op day 3 after left second stage basilic vein transposition placement.  She has not had pain the past two days but on exam this morning the surgical site felt warm, was tender to palpation, and swelling extended down into the forearm. She also states that she has felt slightly feverish since yesterday. Dressing is clean and dry. Tmax today was 99.3 which is elevated a few points from her baseline of 97. Palpable thrill, normal cap refill, and radial pulse distally into the left hand. We are concerned for infection and will consult vascular surgery to examine her. Appreciate their recommendations. - will go ahead and empirically treat with Vanc   3.HTN -blood pressures stable on amlodipine.   4.Type 2 DM: - CBGs well controlled on Lantus 5 and SSI  5.Anemia of CKD - hgb has remained stable - iron studies are on low end of normal - loaded with iron and started on ESA per nephrology    6. Active Hep C Infection: - genotype 1b - follow-up in Hep C clinic outpatient  - denies risk factors; HIV non-reactive  7. 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 She was turned down by 2 centers due to prior noncompliance. CLIP now reaching out to other units. Have also consulted social work for housing concerns.    Synetta Shadow, Medical Student 10/02/2018, 9:24 AM Pager: [phone]  Attestation for Student Documentation:  I personally was present and performed or re-performed the history, physical exam  and medical decision-making activities of this service and have verified that the service and findings are accurately documented in the student's note.  Modena Nunnery D, DO 10/02/2018, 10:36 AM

## 2018-10-02 NOTE — Progress Notes (Signed)
Patient refuses to have tylenol for her fever 101.5  she states she is allergic. Patient refuses oxycodone for pain she says that is does not helps. Ice pack was placed on patient head and abdomen to help lower her temperture will continue to monitor.Osterdock K Aury Scollard

## 2018-10-02 NOTE — Care Management Important Message (Signed)
Important Message  Patient Details  Name: Rhonda Lynch MRN: [id number] Date of Birth: [birth year]   Medicare Important Message Given:  Yes    Aadil Sur 10/02/2018, 3:00 PM

## 2018-10-02 NOTE — Progress Notes (Signed)
[facility]  Subjective  - Complaining of pain at left arm surgical site since 2nd stage brachiobasilic on Monday.  Also complaining of left sided flank pain and upper midline abdominal pain.  States her left hand feels fine.   Objective 125/67 (!) 101 100 F (37.8 C) (Oral) (!) 22 100%  Intake/Output Summary (Last 24 hours) at 10/02/2018 1204 Last data filed at 10/02/2018 0900 Gross per 24 hour  Intake 720 ml  Output 200 ml  Net 520 ml    Left brachiobasilic fistula with good thrill Palpable left radial pulse No motor or sensory deficit in left hand Left hand warm No drainage from incisions No cellulitis  Laboratory Lab Results: Recent Labs    09/30/18 0416  WBC 12.8*  HGB 8.1*  HCT 25.9*  PLT 181   BMET Recent Labs    09/30/18 0416  NA 131*  K 5.4*  CL 98  CO2 21*  GLUCOSE 234*  BUN 55*  CREATININE 6.51*  CALCIUM 8.8*    COAG Lab Results  Component Value Date   INR 0.96 09/29/2018   INR 0.97 07/23/2018   INR 0.98 07/22/2018   No results found for: PTT  Assessment/Planning:  POD#3 s/p left second stage brachiobasilic fistula.  Has a multitude of complaints on our evaluation in dialysis including left flank pain, epigastric pain, and left arm pain at medial arm incision.  I see no overt signs of infection including no drainage or cellulitis.  A little bruising along tunnel.  Fistula has good thrill and palpable radial pulse at wrist.  No large hematoma on exam - has some soft fullness near the elbow and no nerve compressive issues given left hand asymptomatic.  Will continue to monitor.  DO not feel she warrants washout in the OR given relatively benign exam.  Discussed with medicine team to adjust pain medicine.  Will keep her left arm elevated (I.e. pillow).  We will continue to follow.  Marty Heck 10/02/2018 12:04 PM --

## 2018-10-02 NOTE — Progress Notes (Signed)
   10/02/18 1629  Provider Notification  Provider Name/Title MD [place]  Date Provider Notified 10/02/18  Time Provider Notified 1630  Notification Type Page  Notification Reason Change in status (temp 101.3, patient states she is allergic to tylenol)

## 2018-10-02 NOTE — Progress Notes (Signed)
[facility] Progress Note   Dialysis Orders: **She does NOT have an active HD unit, will need to undergo CLIP process again** Prev dialyzed at [facility] (4hr, EDW 59kg, 2K/2.25Ca, TDC, no heparin) DC orders - reduce time to 3.5 hours, use tight heparin  Assessment/Plan: 1.Hyperkalemia: d/t missed HD K up to 5.4 Tuesday - higher due to weekend 2. Uremia - due to missed HD-resolved with adequate HD 3. ESRD -HD 1/13, 1/14. CLIP pending,  previous clinic will not accept her back due to non compliance.Now running TTS here- on heparin here - not sure why noheppreviously at output unit will resume low dose at discharge . Has some degree of renal function. Will set time at 3.5 hours going forward per discussion with Dr. Moshe Cipro. HD today - check labs pre HD 4. Anemia of CKD with Fe deficiency-Hgb 8.8 > 8.1 - continue to trend Low Tsat, startedFe load.-on ESA started at 38 received 1/18-  had been off ESA over 2 months. Will take a while before it trends up 5. Secondary hyperparathyroidism -Ca/P ok, PTH 211 on low dose calcitriol. Follow trends.No binder yet 6. HTN/volume -BP in goal some what variable,Norvasc 10 mgnightly EDW~60post wt 60.6 essentially kept even last two treatments. Wt 63 pre HD Monday with net UF 1.5 - post wt 61.7 - reduced amlodipine to 5 mg at HS - 1/22  7. Nutrition -Alb 2.6  Renal diet w/fluid restrictions. Protein supplements, vitamins.- eating well - could be low in part due to liver disease; ate all of breakfast. 8. 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  9. Dialysis access - L IJ TDC,. S/p L stage 2nd stage 1/20Appreciate VVS help - mild edema seems more sore today- encouraged movement of arm 10. Hep C positive- active infection - per primary 11. 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 Low grade temp noted- asymptomatic/mildleukocytosis 12  Dispo- CLIP pending turned down by [facility] and [facility] due to prior noncompliance.  CLIP sending out info to other units. Should be ready from a renal perspective for d/c once outpatient HD unit is secured.   Myriam Jacobson, PA-C [facility] Beeper [phone] 10/02/2018,9:14 AM  LOS: 10 days   Subjective:   C/o left upper arm pain.  Objective Vitals:   10/01/18 1608 10/01/18 2107 10/02/18 0501 10/02/18 0836  BP: 121/69 135/86 (!) 115/59 125/67  Pulse: 87 89 86 89  Resp: 20 18 18  (!) 22  Temp: 98.7 F (37.1 C) 98 F (36.7 C) 97.6 F (36.4 C) 99.3 F (37.4 C)  TempSrc: Oral   Oral  SpO2: 99% 100% 98% 100%  Weight:  62.3 kg    Height:       Physical Exam  General: NAD less engaged Heart: RRR Lungs: no rales anteriorly Abdomen: soft NTND + BS Extremities: no sig LE edema Dialysis Access: left upper AVF s/p 2nd stage BVT -mild-mod edema + bruit and right IJ [facility]   Additional Objective Labs: Basic Metabolic Panel: Recent Labs  Lab 09/28/18 0518 09/29/18 0400 09/30/18 0416  NA 136 134* 131*  K 4.1 4.7 5.4*  CL 102 101 98  CO2 24 22 21*  GLUCOSE 93 117* 234*  BUN 23 37* 55*  CREATININE 3.48* 5.16* 6.51*  CALCIUM 8.5* 8.9 8.8*  PHOS 3.1 2.8 3.1   Liver Function Tests: Recent Labs  Lab 09/28/18 0518 09/29/18 0400 09/30/18 0416  ALBUMIN 2.4* 2.7* 2.6*  No results for input(s): LIPASE, AMYLASE in the last 168 hours. CBC: Recent Labs  Lab 09/27/18 0759 09/29/18 0400 09/30/18 0416  WBC 6.9 15.3* 12.8*  HGB 8.6* 8.8* 8.1*  HCT 28.8* 28.9* 25.9*  MCV 72.0* 71.0* 72.3*  PLT 153 167 181   Blood Culture    Component Value Date/Time   SDES BLOOD RIGHT ANTECUBITAL 09/22/2018 2023   SDES BLOOD RIGHT HAND 09/22/2018 2023   SPECREQUEST  09/22/2018 2023    BOTTLES DRAWN AEROBIC ONLY Blood Culture results may not be optimal due to an excessive volume of blood received in culture bottles   SPECREQUEST  09/22/2018 2023    BOTTLES DRAWN AEROBIC ONLY  Blood Culture adequate volume   CULT  09/22/2018 2023    NO GROWTH 5 DAYS Performed at [facility], [place] [address], [place], [place] [address]    CULT  09/22/2018 2023    NO GROWTH 5 DAYS Performed at [facility] [address] [place], [place] [address]    REPTSTATUS 09/27/2018 FINAL 09/22/2018 2023   REPTSTATUS 09/27/2018 FINAL 09/22/2018 2023    Cardiac Enzymes: No results for input(s): CKTOTAL, CKMB, CKMBINDEX, TROPONINI in the last 168 hours. CBG: Recent Labs  Lab 10/01/18 0724 10/01/18 1054 10/01/18 1609 10/01/18 2106 10/02/18 0725  GLUCAP 171* 130* 98 92 74   Iron Studies: No results for input(s): IRON, TIBC, TRANSFERRIN, FERRITIN in the last 72 hours. Lab Results  Component Value Date   INR 0.96 09/29/2018   INR 0.97 07/23/2018   INR 0.98 07/22/2018   Studies/Results: No results found. Medications: . sodium chloride 10 mL/hr at 09/29/18 0855  . ferric gluconate (FERRLECIT/NULECIT) IV Stopped (09/30/18 1129)   . amLODipine  5 mg Oral QHS  . calcitRIOL  0.25 mcg Oral Q T,Th,Sa-HD  . Chlorhexidine Gluconate Cloth  6 each Topical Q0600  . [START ON 10/04/2018] darbepoetin (ARANESP) injection - DIALYSIS  40 mcg Intravenous Q Sat-HD  . feeding supplement (PRO-STAT SUGAR FREE 64)  30 mL Oral BID  . fluconazole  200 mg Oral Daily  . heparin injection (subcutaneous)  5,000 Units Subcutaneous Q8H  . insulin aspart  0-5 Units Subcutaneous QHS  . insulin aspart  0-9 Units Subcutaneous TID WC  . insulin glargine  5 Units Subcutaneous QHS  . multivitamin  1 tablet Oral QHS  . sodium chloride flush  3 mL Intravenous Q12H  . zolpidem  5 mg Oral QHS

## 2018-10-02 NOTE — Progress Notes (Signed)
Pharmacy Antibiotic Note  Rhonda Lynch is a 63 y.o. female admitted on 09/22/2018 with AVF site.  Pharmacy has been consulted for vanc dosing.  Pt recently got an AVF, however, she has a temp of 100.4 and surgical site pain today. Team has decided to add empiric vanc for now. She got her HD today. She has been getting TTS HD. 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   Plan: Vanc 1.5g IV x1 then 500mg  TTS Level as needed  Height: 5\' 1"  (154.9 cm) Weight: 132 lb 4.4 oz (60 kg) IBW/kg (Calculated) : 47.8  Temp (24hrs), Avg:99.3 F (37.4 C), Min:97.6 F (36.4 C), Max:101.3 F (38.5 C)  Recent Labs  Lab 09/27/18 0759 09/28/18 0518 09/29/18 0400 09/30/18 0416 10/02/18 1130 10/02/18 1200  WBC 6.9  --  15.3* 12.8* 11.3*  --   CREATININE 5.61* 3.48* 5.16* 6.51*  --  6.01*    Estimated Creatinine Clearance: 7.9 mL/min (A) (by C-G formula based on SCr of 6.01 mg/dL (H)).    Allergies  Allergen Reactions  . Acetaminophen Nausea And Vomiting    Patient states Acetaminophen and Acetaminophen containing products make her nauseated. She demonstrated this 06/21/18 with nausea followed by emesis.    Antimicrobials this admission: 1/23 vanc>> Chronic fluconazole   Dose adjustments this admission:   Microbiology results: 1/23 blood>> 1/13 blood>>neg 1/13 MRSA>>neg  Onnie Boer, PharmD, BCIDP, AAHIVP, CPP Infectious Disease Pharmacist 10/02/2018 5:02 PM

## 2018-10-03 DIAGNOSIS — R509 Fever, unspecified: Secondary | ICD-10-CM

## 2018-10-03 LAB — CBC
HCT: 25 % — ABNORMAL LOW (ref 36.0–46.0)
Hemoglobin: 7.6 g/dL — ABNORMAL LOW (ref 12.0–15.0)
MCH: 22.1 pg — AB (ref 26.0–34.0)
MCHC: 30.4 g/dL (ref 30.0–36.0)
MCV: 72.7 fL — ABNORMAL LOW (ref 80.0–100.0)
Platelets: 169 10*3/uL (ref 150–400)
RBC: 3.44 MIL/uL — ABNORMAL LOW (ref 3.87–5.11)
RDW: 22.5 % — ABNORMAL HIGH (ref 11.5–15.5)
WBC: 8.8 10*3/uL (ref 4.0–10.5)
nRBC: 0.2 % (ref 0.0–0.2)

## 2018-10-03 LAB — GLUCOSE, CAPILLARY
Glucose-Capillary: 93 mg/dL (ref 70–99)
Glucose-Capillary: 79 mg/dL (ref 70–99)
Glucose-Capillary: 150 mg/dL — ABNORMAL HIGH (ref 70–99)
Glucose-Capillary: 101 mg/dL — ABNORMAL HIGH (ref 70–99)

## 2018-10-03 MED ORDER — DIPHENHYDRAMINE-ZINC ACETATE 2-0.1 % EX CREA
TOPICAL_CREAM | Freq: Three times a day (TID) | CUTANEOUS | Status: DC | PRN
  Filled 2018-10-03: qty 28

## 2018-10-03 MED ORDER — DIPHENHYDRAMINE HCL 25 MG PO CAPS
25.0000 mg | ORAL_CAPSULE | Freq: Once | ORAL | Status: AC | PRN
  Administered 2018-10-03: 25 mg via ORAL
  Filled 2018-10-03: qty 1

## 2018-10-03 MED ORDER — HYDROMORPHONE HCL 2 MG PO TABS
1.0000 mg | ORAL_TABLET | Freq: Three times a day (TID) | ORAL | Status: DC | PRN
  Filled 2018-10-03: qty 1

## 2018-10-03 MED ORDER — CAMPHOR-MENTHOL 0.5-0.5 % EX LOTN
TOPICAL_LOTION | CUTANEOUS | Status: DC | PRN
  Filled 2018-10-03: qty 222

## 2018-10-03 NOTE — Progress Notes (Addendum)
Subjective:  hemodialysis yesterday with noted fever overnight , per Dr Carlis Abbott noted Left arm does not appear infected ( 2nd stage  Brachiobasilic avf 9/41/74) minimal arm pain ,awaing OP center  acceptance   Objective Vital signs in last 24 hours: Vitals:   10/02/18 1623 10/02/18 2113 10/03/18 0541 10/03/18 0747  BP: 129/80 126/70 (!) 109/57 127/70  Pulse: (!) 106 (!) 101 91 92  Resp: 18 20 20 20   Temp: (!) 101.3 F (38.5 C) 99.4 F (37.4 C) (!) 101.3 F (38.5 C) 99 F (37.2 C)  TempSrc: Oral Oral Oral Oral  SpO2: 97% 98% 95% 95%  Weight:      Height:       Weight change: -1.3 kg  Physical Exam: General: Sitting upright in  BED  Asleep , Awoken And NAD Heart: RRR, no m,r,g Lungs: CTA bilat Abdomen: + BS , soft, NT, ND Extremities:no pedal edema   Dialysis Access: R IJ P. Cath NT/ LUA AVF sp 2nd Stage BVT with + bruit / mild swellin   Dialysis Orders: **She does NOT have an active HD unit, will need to undergo CLIP process again** Prev dialyzed at [facility] (4hr, EDW 59kg, 2K/2.25Ca, TDC, no heparin) DC orders - reduce time to 3.5 hours, use tight heparin   Problem/Plan: 1. 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  2. Uremia /Hyperkalemia: d/t missed HD K up to 5.0 pre hd yesterday /- due to missed HD-resolved with adequate HD 3. ESRD -HD TTS here ,CLIP pending,  previous clinic will not accept her back due to non compliance.- on heparin here - not sure why noheppreviously at output unit will resume low dose at discharge . Has some degree of renal function. Will set time at 3.5 hours going forward per discussion with Dr. Moshe Cipro. HD tomorrow 4. Anemia of CKDwith Fe deficiency-Hgb8.8 > 8.1> 7.9>7.6  - continue to trend ,Low Tsat, startedFe load.-on ESA started at 38 received 1/18- had been off ESA over 2 months.  5. Secondary hyperparathyroidism  -Ca/P ok, PTH 211 on low dose calcitriol. Follow trends.No binder yet 6. HTN/volume -BP in goal some what variable,Norvasc 10 mgnightly EDW~60post wt 60.6 essentially, 1.0 liter uf yesterday,  Wt to 60 kg . Wt 63 pre HD Monday with net UF 1.5 - post wt 61.7 - reduce amlodipine to 5 mg at HS -  7. Nutrition -Alb 2.5  Renal diet w/fluid restrictions. Protein supplements, vitamins.- eating well - could be low in part due to liver disease. 8. 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  9. Dialysis access - L IJ TDC,. S/p L stage 2nd stage 1/20Appreciate VVS help 10. Hep C positive- active infection - per primary 11. Dispo-CLIP pending turned down by [facility] and [facility] due to prior noncompliance. CLIP sending out info to other units. Should be ready from a renal perspective for d/c once outpatient HD unit is secured.   Ernest Haber, PA-C [facility] Beeper [phone] 10/03/2018,10:37 AM  LOS: 11 days   Pt seen, examined and agree w A/P as above. New fevers here last night.  Pt has TDC, blood cx's are drawn and pending. IV vanc started empirically.  Kelly Splinter MD [employer] pager [phone]   10/03/2018, 11:27 AM    Labs: Basic Metabolic Panel: Recent Labs  Lab 09/29/18 0400 09/30/18 0416 10/02/18 1200  NA 134* 131* 132*  K 4.7 5.4* 5.0  CL 101 98 101  CO2 22 21* 22  GLUCOSE 117* 234* 114*  BUN 37* 55* 50*  CREATININE 5.16* 6.51* 6.01*  CALCIUM 8.9 8.8* 8.3*  PHOS 2.8 3.1 3.3   Liver Function Tests: Recent Labs  Lab 09/29/18 0400 09/30/18 0416 10/02/18 1200  ALBUMIN 2.7* 2.6* 2.5*   No results for input(s): LIPASE, AMYLASE in the last 168 hours. No results for input(s): AMMONIA in the last 168 hours. CBC: Recent Labs  Lab 09/27/18 0759 09/29/18 0400 09/30/18 0416 10/02/18 1130 10/03/18 0322  WBC 6.9 15.3* 12.8* 11.3* 8.8  HGB 8.6* 8.8* 8.1* 7.9* 7.6*  HCT 28.8* 28.9* 25.9* 26.4* 25.0*  MCV 72.0* 71.0*  72.3* 73.3* 72.7*  PLT 153 167 181 233 169   Cardiac Enzymes: No results for input(s): CKTOTAL, CKMB, CKMBINDEX, TROPONINI in the last 168 hours. CBG: Recent Labs  Lab 10/02/18 0725 10/02/18 1054 10/02/18 1625 10/02/18 2115 10/03/18 0744  GLUCAP 74 94 88 149* 93    Studies/Results: No results found. Medications: . sodium chloride Stopped (10/02/18 2131)  . ferric gluconate (FERRLECIT/NULECIT) IV Stopped (10/02/18 1550)   . amLODipine  5 mg Oral QHS  . calcitRIOL  0.25 mcg Oral Q T,Th,Sa-HD  . Chlorhexidine Gluconate Cloth  6 each Topical Q0600  . [START ON 10/04/2018] darbepoetin (ARANESP) injection - DIALYSIS  40 mcg Intravenous Q Sat-HD  . feeding supplement (PRO-STAT SUGAR FREE 64)  30 mL Oral BID  . fluconazole  200 mg Oral Daily  . heparin injection (subcutaneous)  5,000 Units Subcutaneous Q8H  . insulin aspart  0-5 Units Subcutaneous QHS  . insulin aspart  0-9 Units Subcutaneous TID WC  . insulin glargine  5 Units Subcutaneous QHS  . multivitamin  1 tablet Oral QHS  . sodium chloride flush  3 mL Intravenous Q12H  . [START ON 10/04/2018] vancomycin  500 mg Intravenous Q T,Th,Sa-HD  . zolpidem  5 mg Oral QHS

## 2018-10-03 NOTE — Progress Notes (Signed)
Paged to bedside for evaluation of acutely altered mental status. Ms. Houde was previously alert and oriented earlier in the shift but then noted to become acutely altered. She was laying in the bed curled up on her right side, resting comfortably, sleeping. When aroused she became alert, opening her eyes, but not conversant, not appropriately responding to questions. Consistently saying "No" and pulling the covers over her when you tried to touch her. Not cooperative with my exam. She did not appear to be in any acute distress.   101.5, 138/70, RR 18, HR 98, 95% on RA Gen: NAD Neuro: Alert, not oriented. Uncooperative with my exam. Opens eyes to voice and touch, moving all extremities equally Ext: unable to examine fistula site due to patient's lack of cooperation   CBG 101   A/P:  62yoF with ESRD on HD, h/o ICH, DM2, and POD4 2nd stage left brachiobasilic fistula now presenting with acute encephalopathy in the setting of fevers and polypharmacy. Most concerned for polypharmacy in a patient with ESRD. She did receive benadryl twice today which could be contributing to her AMS and she is also on ambien nightly and oxycodone 5mg  prn of which she has received two doses today. Worsening infection could also be contributing. She has been febrile for two days and states she is allergic to tylenol, received her first dose of vancomycin yesterday with HD. Blood cultures are NG<24h. Unfortunately, she would not allow me to examine her fistula site but according to previous exams, there were no signs of cellulitis. Electrolyte abnormalities are also in consideration. Last BMP was yesterday and K was 5.0. She did complete a full HD session yesterday. I would like to recheck a BMP but I do not think she will allow someone to draw any blood from her right now.  Despite the fever, her other vital signs are WNLs. She does not appear to be in any acute distress and is resting comfortably. - continue vancomycin with  HD - discontinue benadryl, ambien, and oxycodone - add po dilaudid prn for pain - add topical benadryl prn for itching, also has sarna lotion prn for itching  - HD tomorrow   Francesco Runner, MD Internal Medicine, PGY1 Pager: [phone] 10/03/2018, 10:51 PM

## 2018-10-03 NOTE — Progress Notes (Signed)
MD was paged three times about patient temperature  of 102.9. Used Ice pack to lower temp ,patient refuses tylenol states that she is allergic. Will continue to monitor patient closely.[place]aren[place] K Lygia Olaes

## 2018-10-03 NOTE — Progress Notes (Signed)
   Subjective: Ms. Riedlinger was seen and evaluated at bedside. Her fever curve trended up to 101 overnight. This morning she was fairly somnolent but arousable. Endorses chills. Her left arm is painful with movement or to light touch.   Objective:  Vital signs in last 24 hours: Vitals:   10/02/18 1623 10/02/18 2113 10/03/18 0541 10/03/18 0747  BP: 129/80 126/70 (!) 109/57 127/70  Pulse: (!) 106 (!) 101 91 92  Resp: 18 20 20 20   Temp: (!) 101.3 F (38.5 C) 99.4 F (37.4 C) (!) 101.3 F (38.5 C) 99 F (37.2 C)  TempSrc: Oral Oral Oral Oral  SpO2: 97% 98% 95% 95%  Weight:      Height:       General: drowsy but arousable. Lying in bed in NAD CV: RRR Pulm: normal respiratory efforts; lungs CTA Abd: +BS; abdomen is soft, non-distended, non-tender Ext: LUE with increased warmth, mild edema, tenderness to light touch or any movement; brachiobasilic fistula with good thrill; left radial pulse intact   Assessment/Plan:  Principal Problem:   ESRD (end stage renal disease) ([place]) Active Problems:   Microcytic anemia   Hypertension   Abdominal pain   Type 2 diabetes mellitus (HCC)   Chronic hepatitis C virus genotype 1b infection (HCC)   Left arm pain  Ms. 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 Undergoing CLIP process now.  1.ESRD on Dialysis 2/2 ANCA Vasculitis  Hyperkalemia -Stable - Appreciate nephrology managing inpatient dialysis; undergoing CLIP process which they are hopeful will be completeearly next week - continue T/TH/S schedule   2. Suspected LUE celulitis:  - fever curve trended up since yesterday - She is post op day 4 after left second stage brachiobasilic  fistula - LUE with increased warmth and tenderness on exam - appreciate vascular following - Blood cx pending - no other systemic symptoms of infection including cough or dysuria  - no indication to broaden abx; continuing vancomycin   3.HTN -blood pressures stable on amlodipine.   4.Type 2 DM: - CBGs well controlled on Lantus 5 and SSI  5.Anemia of CKD - hgb has remained stable - iron studies are on low end of normal - loaded with iron and started on ESA per nephrology    6.ActiveHep CInfection: - genotype 1b - follow-up in Hep C clinic outpatient  - denies risk factors; HIV non-reactive  7. 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 She was turned down by 2 centers due to prior noncompliance. CLIP now reaching out to other units. Have also consulted social work for housing concerns.     Modena Nunnery D, DO 10/03/2018, 11:39 AM Pager: [phone]

## 2018-10-03 NOTE — Progress Notes (Signed)
[facility]  Subjective  - Fever overnight and started on vancomycin.  States left arm pain improving today since 2nd stage brachiobasilic on Monday.     Objective 127/70 92 99 F (37.2 C) (Oral) 20 95%  Intake/Output Summary (Last 24 hours) at 10/03/2018 0928 Last data filed at 10/03/2018 0853 Gross per 24 hour  Intake 1167.55 ml  Output 1004 ml  Net 163.55 ml    Left brachiobasilic fistula with good thrill Palpable left radial pulse No motor or sensory deficit in left hand Left hand warm No drainage from incisions all c/d/i No cellulitis  Laboratory Lab Results: Recent Labs    10/02/18 1130 10/03/18 0322  WBC 11.3* 8.8  HGB 7.9* 7.6*  HCT 26.4* 25.0*  PLT 233 169   BMET Recent Labs    10/02/18 1200  NA 132*  K 5.0  CL 101  CO2 22  GLUCOSE 114*  BUN 50*  CREATININE 6.01*  CALCIUM 8.3*    COAG Lab Results  Component Value Date   INR 0.96 09/29/2018   INR 0.97 07/23/2018   INR 0.98 07/22/2018   No results found for: PTT  Assessment/Planning:  POD#4 s/p left second stage brachiobasilic fistula.  Started on vanc overnight for fever and blood cultures pending.  States left arm pain improving this morning and no large hematoma etc that warrants washout in OR.  No cellulitis either that I appreciate on exam.  Will continue to monitor.  Good thrill.    Marty Heck 10/03/2018 9:28 AM --

## 2018-10-04 DIAGNOSIS — T8149XA Infection following a procedure, other surgical site, initial encounter: Secondary | ICD-10-CM

## 2018-10-04 LAB — CBC
HCT: 24 % — ABNORMAL LOW (ref 36.0–46.0)
HEMOGLOBIN: 7.7 g/dL — AB (ref 12.0–15.0)
MCH: 22.8 pg — ABNORMAL LOW (ref 26.0–34.0)
MCHC: 32.1 g/dL (ref 30.0–36.0)
MCV: 71.2 fL — ABNORMAL LOW (ref 80.0–100.0)
Platelets: 171 10*3/uL (ref 150–400)
RBC: 3.37 MIL/uL — ABNORMAL LOW (ref 3.87–5.11)
RDW: 21.6 % — ABNORMAL HIGH (ref 11.5–15.5)
WBC: 12.6 10*3/uL — ABNORMAL HIGH (ref 4.0–10.5)
nRBC: 0.2 % (ref 0.0–0.2)

## 2018-10-04 LAB — GLUCOSE, CAPILLARY
Glucose-Capillary: 125 mg/dL — ABNORMAL HIGH (ref 70–99)
Glucose-Capillary: 120 mg/dL — ABNORMAL HIGH (ref 70–99)
Glucose-Capillary: 113 mg/dL — ABNORMAL HIGH (ref 70–99)
Glucose-Capillary: 118 mg/dL — ABNORMAL HIGH (ref 70–99)

## 2018-10-04 MED ORDER — HYDROMORPHONE HCL 2 MG PO TABS
1.0000 mg | ORAL_TABLET | Freq: Three times a day (TID) | ORAL | Status: DC | PRN
  Administered 2018-10-04 – 2018-10-05 (×3): 1 mg via ORAL
  Filled 2018-10-04 (×2): qty 1

## 2018-10-04 NOTE — Progress Notes (Addendum)
Subjective:  Awoken from sleep no cos, for hd today   Objective Vital signs in last 24 hours: Vitals:   10/03/18 1859 10/03/18 2148 10/04/18 0508 10/04/18 0737  BP:  138/70 (!) 116/55 128/66  Pulse:  98 97 91  Resp:  18 16 18   Temp: 99.2 F (37.3 C) (!) 101.5 F (38.6 C) 99.8 F (37.7 C) 98.6 F (37 C)  TempSrc: Oral Oral Oral Oral  SpO2:  95% 96% 92%  Weight:      Height:       Weight change:   Physical Exam: General: Awoken from sleep and  NAD Heart: RRR, no m,r,g Lungs: CTA bilat Abdomen: + BS , soft, NT, ND Extremities:no pedal edema   Dialysis Access: R IJ P. Cath NT/ LUA AVF sp 2nd Stage BVT with + bruit / mild swelling   Dialysis Orders: **She does NOT have an active HD unit, will need to undergo CLIP process again** Prev dialyzed at [facility] (4hr, EDW 59kg, 2K/2.25Ca, TDC, no heparin) DC orders - reduce time to 3.5 hours, use tight heparin   Problem/Plan: 1. Fevers =Afeb now , BC no growth so far ,  / Vancomycin started  Prior  Hx Fungemia w/possible TV involvement - Per ID in 06/2018, recommended Fluconazole indefinitely BC01/13 nogrowth.  2. Uremia /Hyperkalemia: d/t missed HD ,resolved with adequate HD 3. ESRD -HD TTS here ,CLIP pending, previous clinic will not accept her back due to non compliance.- other med director reviewing records, on heparin here - not sure why noheppreviously at output unit will resume low dose at discharge . Has some degree of renal function. Will set time at 3.5 hours going forward per discussion with Dr. Moshe Cipro. HD today  4. Anemia of CKDwith Fe deficiency-Hgb8.8 >8.1> 7.9>7.6 >7.7 today  - continue to trend ,Low Tsat, startedFe load.-on ESA started at 38 received 1/18- had been off ESA over 2 months.  5. Secondary hyperparathyroidism -Ca/P ok, PTH 211 on low dose calcitriol. Follow trends.No binder yet 6. HTN/volume -BP in goal somewhat variable,Norvasc 10 mgnightly EDW~60post wt 60.6 essentially, 1.0  liter uf yesterday,  Wt to 60 kg . Wt 63 pre HD Monday with net UF 1.5 - post wt 61.7 - reduce amlodipine to 5 mg at HS -  7. Nutrition -Alb 2.5 Renal diet w/fluid restrictions. Protein supplements, vitamins.- eating well - could be low in part due to liver disease. 8. 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  9. Dialysis access - L IJ TDC,. S/p L 2nd stage AVF on 1/20 10. Hep C positive- active infection - per primary 11. Dispo-CLIP pending turned down by [facility] and [facility] due to prior noncompliance. CLIP sending out info to other units. Should be ready from a renal perspective for d/c once outpatient HD unit is secured.   Ernest Haber, PA-C [facility] [phone] 10/04/2018,10:56 AM  LOS: 12 days   Pt seen, examined and agree w A/P as above.  Kelly Splinter MD [facility] pager [phone]   10/04/2018, 1:40 PM    Labs: Basic Metabolic Panel: Recent Labs  Lab 09/29/18 0400 09/30/18 0416 10/02/18 1200  NA 134* 131* 132*  K 4.7 5.4* 5.0  CL 101 98 101  CO2 22 21* 22  GLUCOSE 117* 234* 114*  BUN 37* 55* 50*  CREATININE 5.16* 6.51* 6.01*  CALCIUM 8.9 8.8* 8.3*  PHOS 2.8 3.1 3.3   Liver Function Tests: Recent Labs  Lab 09/29/18 0400 09/30/18 0416 10/02/18  1200  ALBUMIN 2.7* 2.6* 2.5*   No results for input(s): LIPASE, AMYLASE in the last 168 hours. No results for input(s): AMMONIA in the last 168 hours. CBC: Recent Labs  Lab 09/29/18 0400 09/30/18 0416 10/02/18 1130 10/03/18 0322 10/04/18 0537  WBC 15.3* 12.8* 11.3* 8.8 12.6*  HGB 8.8* 8.1* 7.9* 7.6* 7.7*  HCT 28.9* 25.9* 26.4* 25.0* 24.0*  MCV 71.0* 72.3* 73.3* 72.7* 71.2*  PLT 167 181 233 169 171   Cardiac Enzymes: No results for input(s): CKTOTAL, CKMB, CKMBINDEX, TROPONINI in the last 168 hours. CBG: Recent Labs  Lab 10/03/18 0744 10/03/18 1111 10/03/18 1649 10/03/18 2204 10/04/18 0736  GLUCAP 93 79 150* 101* 125*    Studies/Results: No  results found. Medications: . sodium chloride Stopped (10/02/18 2131)  . ferric gluconate (FERRLECIT/NULECIT) IV Stopped (10/02/18 1550)   . amLODipine  5 mg Oral QHS  . calcitRIOL  0.25 mcg Oral Q T,Th,Sa-HD  . Chlorhexidine Gluconate Cloth  6 each Topical Q0600  . darbepoetin (ARANESP) injection - DIALYSIS  40 mcg Intravenous Q Sat-HD  . feeding supplement (PRO-STAT SUGAR FREE 64)  30 mL Oral BID  . fluconazole  200 mg Oral Daily  . heparin injection (subcutaneous)  5,000 Units Subcutaneous Q8H  . insulin aspart  0-5 Units Subcutaneous QHS  . insulin aspart  0-9 Units Subcutaneous TID WC  . insulin glargine  5 Units Subcutaneous QHS  . multivitamin  1 tablet Oral QHS  . sodium chloride flush  3 mL Intravenous Q12H  . vancomycin  500 mg Intravenous Q T,Th,Sa-HD

## 2018-10-04 NOTE — Progress Notes (Signed)
  Date: 10/04/2018  Patient name: PRIYAH SCHMUCK  Medical record number: [id number]  Date of birth: October 16, 1955   This patient's plan of care was discussed with the house staff. Please see Dr. Janne Napoleon note for complete details. I concur with her findings.   Sid Falcon, MD 10/04/2018, 9:22 PM

## 2018-10-04 NOTE — Progress Notes (Signed)
  Progress Note    10/04/2018 9:27 AM 5 Days Post-Op  Subjective: Most confused last night but states she is feeling better now, still complaining of pain in her left upper extremity  Vitals:   10/04/18 0508 10/04/18 0737  BP: (!) 116/55 128/66  Pulse: 97 91  Resp: 16 18  Temp: 99.8 F (37.7 C) 98.6 F (37 C)  SpO2: 96% 92%    Physical Exam: Awake alert oriented Left arm has some fullness in the upper arm there is a strong palpable thrill, there is no erythema or drainage Left hand is warm and well-perfused with a palpable left radial pulse  CBC    Component Value Date/Time   WBC 12.6 (H) 10/04/2018 0537   RBC 3.37 (L) 10/04/2018 0537   HGB 7.7 (L) 10/04/2018 0537   HGB 8.0 (L) 03/27/2018 1107   HCT 24.0 (L) 10/04/2018 0537   HCT 25.3 (L) 03/27/2018 1107   PLT 171 10/04/2018 0537   PLT 126 (L) 03/27/2018 1107   MCV 71.2 (L) 10/04/2018 0537   MCV 83 03/27/2018 1107   MCH 22.8 (L) 10/04/2018 0537   MCHC 32.1 10/04/2018 0537   RDW 21.6 (H) 10/04/2018 0537   RDW 19.9 (H) 03/27/2018 1107   LYMPHSABS 1.8 09/22/2018 1317   LYMPHSABS 0.5 (L) 03/27/2018 1107   MONOABS 0.6 09/22/2018 1317   EOSABS 0.1 09/22/2018 1317   EOSABS 0.0 03/27/2018 1107   BASOSABS 0.1 09/22/2018 1317   BASOSABS 0.0 03/27/2018 1107    BMET    Component Value Date/Time   NA 132 (L) 10/02/2018 1200   NA 135 03/25/2018 1014   K 5.0 10/02/2018 1200   CL 101 10/02/2018 1200   CO2 22 10/02/2018 1200   GLUCOSE 114 (H) 10/02/2018 1200   BUN 50 (H) 10/02/2018 1200   BUN 96 (HH) 03/25/2018 1014   CREATININE 6.01 (H) 10/02/2018 1200   CALCIUM 8.3 (L) 10/02/2018 1200   CALCIUM 8.3 (L) 09/23/2018 1329   GFRNONAA 7 (L) 10/02/2018 1200   GFRAA 8 (L) 10/02/2018 1200    INR    Component Value Date/Time   INR 0.96 09/29/2018 0400     Intake/Output Summary (Last 24 hours) at 10/04/2018 5809 Last data filed at 10/04/2018 9833 Gross per 24 hour  Intake 600 ml  Output 500 ml  Net 100 ml      Assessment/plan:  63 y.o. female is s/p left second stage basilic vein transposed fistula now admitted with pain and had fever and leukocytosis.  I do not see any evidence of infection from her recent surgery although she is having significant pain.  I would not plan any operation at this time.  I will be available over the weekend as needed otherwise Dr. Carlis Abbott will reevaluate her on Monday if she is inpatient.    Joory Gough C. Donzetta Matters, MD [facility] Office: [phone] Pager: [phone]  10/04/2018 9:27 AM

## 2018-10-04 NOTE — Progress Notes (Signed)
   Subjective: Rhonda Lynch was seen and evaluated at bedside. The night team was paged due to altered mental status. Felt to be combination of fever and polypharmacy in the setting of ESRD. This morning, she was sleepy but back at her baseline mental status. She complained of pain in her left arm but overall much improved. She was able to move it without significant pain this morning. She denies any chills, abdominal pain, or shortness of breath.   Objective:  Vital signs in last 24 hours: Vitals:   10/03/18 1859 10/03/18 2148 10/04/18 0508 10/04/18 0737  BP:  138/70 (!) 116/55 128/66  Pulse:  98 97 91  Resp:  18 16 18   Temp: 99.2 F (37.3 C) (!) 101.5 F (38.6 C) 99.8 F (37.7 C) 98.6 F (37 C)  TempSrc: Oral Oral Oral Oral  SpO2:  95% 96% 92%  Weight:      Height:       General: resting comfortably in bed in NAD CV: RRR; no murmurs, rubs or gallops Pulm: normal respiratory effort; lungs CTA bilaterally  Assessment/Plan:  Principal Problem:   ESRD (end stage renal disease) (HCC) Active Problems:   Microcytic anemia   Hypertension   Abdominal pain   Type 2 diabetes mellitus (HCC)   Chronic hepatitis C virus genotype 1b infection (HCC)   Left arm pain  Rhonda Lynch is a 63 yo female with a hx significant for 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 Undergoing CLIP process now.  1.ESRD on Dialysis 2/2 ANCA Vasculitis  Hyperkalemia -Stable - Appreciate nephrology managing inpatient dialysis; undergoing CLIP process for outpatient dialysis placement. Unfortunately a prolonged process due to history of non-compliance in the past - continue T/TH/S schedule   2.Suspected LUE celulitis: - patient is now afebrile  - She is  post op day 5 after left second stage brachiobasilic fistula - LUE less painful on exam today - appreciate vascular following - Blood cx pending negative at 48 hours - continue Vancomycin  3.HTN -blood pressures stable on amlodipine.   4.Type 2 DM: - CBGs well controlled on Lantus 5 and SSI  5.Anemia of CKD - hgb has remained stable - iron studies are on low end of normal - loaded with iron and started on ESA per nephrology   6.ActiveHep CInfection: - genotype 1b - follow-up in Hep C clinic outpatient  - denies risk factors; HIV non-reactive  7. 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 She was turned down by 2 centers due to prior noncompliance. CLIP now reaching out to other units.   Modena Nunnery D, DO 10/04/2018, 2:51 PM Pager: [phone]

## 2018-10-05 ENCOUNTER — Inpatient Hospital Stay (HOSPITAL_COMMUNITY): Payer: Medicare Other

## 2018-10-05 LAB — GLUCOSE, CAPILLARY
GLUCOSE-CAPILLARY: 95 mg/dL (ref 70–99)
Glucose-Capillary: 107 mg/dL — ABNORMAL HIGH (ref 70–99)
Glucose-Capillary: 124 mg/dL — ABNORMAL HIGH (ref 70–99)
Glucose-Capillary: 107 mg/dL — ABNORMAL HIGH (ref 70–99)

## 2018-10-05 LAB — CBC
HCT: 26.4 % — ABNORMAL LOW (ref 36.0–46.0)
Hemoglobin: 8.2 g/dL — ABNORMAL LOW (ref 12.0–15.0)
MCH: 22.2 pg — ABNORMAL LOW (ref 26.0–34.0)
MCHC: 31.1 g/dL (ref 30.0–36.0)
MCV: 71.5 fL — ABNORMAL LOW (ref 80.0–100.0)
Platelets: 225 10*3/uL (ref 150–400)
RBC: 3.69 MIL/uL — AB (ref 3.87–5.11)
RDW: 21 % — ABNORMAL HIGH (ref 11.5–15.5)
WBC: 13.1 10*3/uL — ABNORMAL HIGH (ref 4.0–10.5)
nRBC: 0 % (ref 0.0–0.2)

## 2018-10-05 LAB — TROPONIN I: TROPONIN I: 0.08 ng/mL — AB (ref <0.03)

## 2018-10-05 MED ORDER — SIMETHICONE 80 MG PO CHEW
80.0000 mg | CHEWABLE_TABLET | Freq: Four times a day (QID) | ORAL | Status: DC | PRN
  Filled 2018-10-05: qty 1

## 2018-10-05 MED ORDER — DARBEPOETIN ALFA 40 MCG/0.4ML IJ SOSY
40.0000 ug | PREFILLED_SYRINGE | INTRAMUSCULAR | Status: DC
  Filled 2018-10-05: qty 0.4

## 2018-10-05 MED ORDER — ALUM & MAG HYDROXIDE-SIMETH 200-200-20 MG/5ML PO SUSP: 30.0000 mL | Freq: Four times a day (QID) | ORAL | Status: DC | PRN

## 2018-10-05 MED ORDER — HYDROMORPHONE HCL 1 MG/ML IJ SOLN
0.5000 mg | Freq: Once | INTRAMUSCULAR | Status: AC | PRN
  Administered 2018-10-05: 0.5 mg via INTRAVENOUS
  Filled 2018-10-05: qty 1

## 2018-10-05 MED ORDER — ACETAMINOPHEN 325 MG PO TABS
650.0000 mg | ORAL_TABLET | Freq: Four times a day (QID) | ORAL | Status: DC | PRN
  Filled 2018-10-05 (×5): qty 2

## 2018-10-05 NOTE — Progress Notes (Signed)
   10/05/18 0820  Pain Assessment  Pain Scale 0-10  Pain Score 8  Pain Type Acute pain  Pain Location Chest  Pain Orientation Mid  Pain Intervention(s) MD notified (Comment)  Provider Notification  Provider Name/Title [facility]  Date Provider Notified 10/05/18  Time Provider Notified [phone]  Notification Type Page  Notification Reason Other (Comment) (Patient with complaints of chest pain)

## 2018-10-05 NOTE — Progress Notes (Signed)
  Date: 10/05/2018  Patient name: Rhonda Lynch  Medical record number: [id number]  Date of birth: Jun 22, 1956   I have seen and evaluated this patient and I have discussed the plan of care with the house staff. Please see Dr. Janne Napoleon note for complete details. I concur with her findings.  Sid Falcon, MD 10/05/2018, 10:35 PM

## 2018-10-05 NOTE — Progress Notes (Addendum)
Subjective:  Unfortunately,  No hd yesterday  And she refuses HD today , Temp spike again to 102.5 last pm . Noted pm confusion "due to fever polypharmacy" this am calm slightly withdrawn but OX3 .  Objective Vital signs in last 24 hours: Vitals:   10/04/18 2023 10/05/18 0436 10/05/18 0600 10/05/18 0821  BP: 127/77 133/71  (!) 153/82  Pulse: (!) 108 (!) 102  (!) 112  Resp: 20 18  (!) 24  Temp: (!) 100.7 F (38.2 C) (!) 101 F (38.3 C) (!) 102.5 F (39.2 C) (!) 101.3 F (38.5 C)  TempSrc: Oral Oral Oral Oral  SpO2: 96% 98%  100%  Weight:      Height:       Weight change:   Physical Exam: General: soft spoken and   NAD is oriented x3  Heart:RRR, no m,r,g Lungs:CTA bilat Abdomen:+ BS , soft, NT, ND Extremities:no pedal edema Dialysis Access:R IJ P. Cath NT/ LUA AVF sp 2nd Stage BVT with + bruit / mild swelling  Dialysis Orders: **She does NOT have an active HD unit, will need to undergo CLIP process again** Prev dialyzed at [facility] (4hr, EDW 59kg, 2K/2.25Ca, TDC, no heparin)  S/p L 2nd stage AVF on 01/20/ 20  DC orders - reduce time to 3.5 hours, use tight heparin   Problem/Plan: 1. Fevers =  BCno growth so far,admit team work up , today CXR /on Vancomycin / Dr Carlis Abbott has eval her L upper arm avf  sp 09/29/18 surgery "do not see any evidence of infection" , WBC13.1  Has Perm cath also /  Prior Hx Fungemia w/possible TV involvement - Per ID in 06/2018, recommended Fluconazole indefinitely .  2. Uremia/Hyperkalemia: d/t missed HD ,resolved with adequate HD 3. ESRD -HDTTS here ,Inadverdently missed Hd yesterday (our mistake) ,refusing hd  today  ,Vol ok/ do hd in am /CLIP pending, previous clinic will not accept her back due to non compliance.- other med director reviewing records, on heparin here - not sure why noheppreviously at output unit will resume low dose at discharge . Has some degree of renal function. Will set time at 3.5 hours going forward per  discussion with Dr. Moshe Cipro.   4. Anemia of CKDwith Fe deficiency-Hgb8.8 >8.1> 7.9>7.6>7.7 >8.2 today - continue to trend ,Low Tsat, startedFe load.-on ESA started at 38 received 1/18- had been off ESA over 2 months.  5. Secondary hyperparathyroidism -Ca/P ok, PTH 211 on low dose calcitriol. Follow trends.No binder yet 6. HTN/volume -BP in goal somewhat variable,Norvasc 10 mgnightly EDW~60post wt 60.6 essentially, 1.0 liter uf yesterday, Wt to 60 kg .Wt 63 pre HD Monday with net UF 1.5 - post wt 61.7 - reduce amlodipine to 5 mg at HS -  7. Nutrition -Alb 2.5Renal diet w/fluid restrictions. Protein supplements, vitamins.-past 24hr poor appetite with fever - alb could be low in part due to liver disease. 8. 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  9. Dialysis access - L IJ TDC,. S/p L 2nd stage AVF on 1/20 10. Hep C positive- active infection - per primary 11.Dispo-CLIP pending turned down by [facility] and [facility] due to prior noncompliance. CLIP sending out info to other units. Should be ready from a renal perspective for d/c once outpatient HD unit is secured and afeb.   Ernest Haber, PA-C [place] [phone] 10/05/2018,1:22 PM  LOS: 13 days   Pt seen, examined and agree w A/P as above. New fevers, blood cx's negative and tunneled  cath not grossly infected. Unclear cause. CXR ordered for today. Is on IV vanc.  Kelly Splinter MD [facility] pager [phone]   10/05/2018, 3:43 PM    Labs: Basic Metabolic Panel: Recent Labs  Lab 09/29/18 0400 09/30/18 0416 10/02/18 1200  NA 134* 131* 132*  K 4.7 5.4* 5.0  CL 101 98 101  CO2 22 21* 22  GLUCOSE 117* 234* 114*  BUN 37* 55* 50*  CREATININE 5.16* 6.51* 6.01*  CALCIUM 8.9 8.8* 8.3*  PHOS 2.8 3.1 3.3   Liver Function Tests: Recent Labs  Lab 09/29/18 0400 09/30/18 0416 10/02/18 1200  ALBUMIN 2.7* 2.6* 2.5*   No results for input(s): LIPASE, AMYLASE  in the last 168 hours. No results for input(s): AMMONIA in the last 168 hours. CBC: Recent Labs  Lab 09/30/18 0416 10/02/18 1130 10/03/18 0322 10/04/18 0537 10/05/18 0621  WBC 12.8* 11.3* 8.8 12.6* 13.1*  HGB 8.1* 7.9* 7.6* 7.7* 8.2*  HCT 25.9* 26.4* 25.0* 24.0* 26.4*  MCV 72.3* 73.3* 72.7* 71.2* 71.5*  PLT 181 233 169 171 225   Cardiac Enzymes: Recent Labs  Lab 10/05/18 0835  TROPONINI 0.08*   CBG: Recent Labs  Lab 10/04/18 1133 10/04/18 1707 10/04/18 2020 10/05/18 0817 10/05/18 1116  GLUCAP 120* 113* 118* 107* 124*    Studies/Results: No results found. Medications: . sodium chloride Stopped (10/02/18 2131)  . ferric gluconate (FERRLECIT/NULECIT) IV Stopped (10/02/18 1550)   . amLODipine  5 mg Oral QHS  . calcitRIOL  0.25 mcg Oral Q T,Th,Sa-HD  . Chlorhexidine Gluconate Cloth  6 each Topical Q0600  . [START ON 10/07/2018] darbepoetin (ARANESP) injection - DIALYSIS  40 mcg Intravenous Q Tue-HD  . feeding supplement (PRO-STAT SUGAR FREE 64)  30 mL Oral BID  . fluconazole  200 mg Oral Daily  . heparin injection (subcutaneous)  5,000 Units Subcutaneous Q8H  . insulin aspart  0-5 Units Subcutaneous QHS  . insulin aspart  0-9 Units Subcutaneous TID WC  . insulin glargine  5 Units Subcutaneous QHS  . multivitamin  1 tablet Oral QHS  . sodium chloride flush  3 mL Intravenous Q12H  . vancomycin  500 mg Intravenous Q T,Th,Sa-HD

## 2018-10-05 NOTE — Progress Notes (Signed)
CRITICAL VALUE ALERT  Critical Value:  Troponin = 0.08  Date & Time Notied:  10/05/2018  Provider Notified: [facility]

## 2018-10-05 NOTE — Progress Notes (Signed)
Spoke with Masoudi. Temp 102.5. Patient allergic to Tylenol. Refusing any med to lower temp. AM MD to address.

## 2018-10-05 NOTE — Progress Notes (Signed)
Masoudi texted. Temp is 101. Patient is allergic to Tylenol. Refuses Motrin. Per other staff who have had her, this is her norm.

## 2018-10-05 NOTE — Progress Notes (Signed)
Masoudi texted to inform of temp 101, pulse 102. Awaiting orders.

## 2018-10-05 NOTE — Progress Notes (Signed)
   Subjective: Rhonda Lynch was seen and evaluated at bedside. She was febrile to 101 overnight. This morning she complained of 8/10 chest pain that began last night. She states it is worse with movement and breathing, but also feels like it is better when she belches. The pain doesn't radiate anywhere. No associated  She denies cough, dysuria, diarrhea, or abdominal pain.   Objective:  Vital signs in last 24 hours: Vitals:   10/04/18 2023 10/05/18 0436 10/05/18 0600 10/05/18 0821  BP: 127/77 133/71  (!) 153/82  Pulse: (!) 108 (!) 102  (!) 112  Resp: 20 18  (!) 24  Temp: (!) 100.7 F (38.2 C) (!) 101 F (38.3 C) (!) 102.5 F (39.2 C) (!) 101.3 F (38.5 C)  TempSrc: Oral Oral Oral Oral  SpO2: 96% 98%  100%  Weight:      Height:       General: awake, alert, lying in bed in NAD CV: RRR Pulm: normal respiratory effort; lungs CTA anteriorly; chest wall TTP along right parasternal border Abd: BS+; abdomen is soft, non-tender, non-distended   Assessment/Plan:  Principal Problem:   ESRD (end stage renal disease) (HCC) Active Problems:   Microcytic anemia   Hypertension   Abdominal pain   Type 2 diabetes mellitus (HCC)   Chronic hepatitis C virus genotype 1b infection (HCC)   Left arm pain   Surgical site infection  Rhonda Lynch is a 63 yo female with a hx significant for 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 Undergoing CLIP process now.  1.ESRD on Dialysis 2/2 ANCA Vasculitis  Hyperkalemia -Stable - Appreciate nephrology managing inpatient dialysis; undergoing CLIP process for outpatient dialysis placement. Unfortunately a prolonged process due to history of non-compliance in the past - continue T/TH/S schedule  2. Acute  chest pain - troponin 0.08 which is lower than when she initially presented. EKG shows ST segment changes in anterior leads which is consistent with priors - have ordered GI cocktail and Gas-X prn - will continue to monitor and repeat EKG if symptoms return   2.Suspected LUE cellulitis: - spiked fever to 101 overnight -She is post op day6after left second stage brachiobasilic fistula   - blood cx show NGTD - will attempt to get U/S of LUE today to rule out underlying abscess - obtaining CXR to rule out other potential infectious source; no respiratory symptoms  - appreciate vascular following - will continue Vancomycin; no indication to broaden at this time   3.HTN -blood pressures stable on amlodipine.   4.Type 2 DM: - CBGs well controlled on Lantus 5 and SSI  5.Anemia of CKD - hgb has remained stable - iron studies are on low end of normal - loaded with iron and started on ESA per nephrology   6.ActiveHep CInfection: - genotype 1b - follow-up in Hep C clinic outpatient  - denies risk factors; HIV non-reactive  7. 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   Rhonda Lynch D, DO 10/05/2018, 1:19 PM Pager: [phone]

## 2018-10-05 NOTE — Progress Notes (Signed)
Texted [place], IM to inform of 101.9 temp. Awaiting response.

## 2018-10-06 ENCOUNTER — Inpatient Hospital Stay (HOSPITAL_COMMUNITY): Payer: Medicare Other | Admitting: Anesthesiology

## 2018-10-06 ENCOUNTER — Encounter (HOSPITAL_COMMUNITY): Payer: Self-pay | Admitting: Certified Registered"

## 2018-10-06 ENCOUNTER — Encounter (HOSPITAL_COMMUNITY)
Admission: EM | Discharge status: Against Medical Advice | Payer: Self-pay | Source: Home / Self Care | Attending: Internal Medicine

## 2018-10-06 DIAGNOSIS — R0789 Other chest pain: Secondary | ICD-10-CM

## 2018-10-06 DIAGNOSIS — T82898A Other specified complication of vascular prosthetic devices, implants and grafts, initial encounter: Secondary | ICD-10-CM

## 2018-10-06 HISTORY — PX: I & D EXTREMITY: SHX5045

## 2018-10-06 LAB — RENAL FUNCTION PANEL
Albumin: 2.1 g/dL — ABNORMAL LOW (ref 3.5–5.0)
Anion gap: 18 — ABNORMAL HIGH (ref 5–15)
BUN: 69 mg/dL — ABNORMAL HIGH (ref 8–23)
CO2: 19 mmol/L — AB (ref 22–32)
Calcium: 8.1 mg/dL — ABNORMAL LOW (ref 8.9–10.3)
Chloride: 93 mmol/L — ABNORMAL LOW (ref 98–111)
Creatinine, Ser: 9.68 mg/dL — ABNORMAL HIGH (ref 0.44–1.00)
GFR calc Af Amer: 4 mL/min — ABNORMAL LOW (ref >60)
GFR calc non Af Amer: 4 mL/min — ABNORMAL LOW (ref >60)
Glucose, Bld: 102 mg/dL — ABNORMAL HIGH (ref 70–99)
Phosphorus: 8.7 mg/dL — ABNORMAL HIGH (ref 2.5–4.6)
Potassium: 4.7 mmol/L (ref 3.5–5.1)
Sodium: 130 mmol/L — ABNORMAL LOW (ref 135–145)

## 2018-10-06 LAB — GLUCOSE, CAPILLARY
Glucose-Capillary: 98 mg/dL (ref 70–99)
Glucose-Capillary: 101 mg/dL — ABNORMAL HIGH (ref 70–99)
Glucose-Capillary: 84 mg/dL (ref 70–99)
Glucose-Capillary: 81 mg/dL (ref 70–99)
Glucose-Capillary: 126 mg/dL — ABNORMAL HIGH (ref 70–99)
Glucose-Capillary: 313 mg/dL — ABNORMAL HIGH (ref 70–99)

## 2018-10-06 LAB — CBC
HCT: 25.5 % — ABNORMAL LOW (ref 36.0–46.0)
Hemoglobin: 8 g/dL — ABNORMAL LOW (ref 12.0–15.0)
MCH: 22.4 pg — ABNORMAL LOW (ref 26.0–34.0)
MCHC: 31.4 g/dL (ref 30.0–36.0)
MCV: 71.4 fL — ABNORMAL LOW (ref 80.0–100.0)
Platelets: 268 10*3/uL (ref 150–400)
RBC: 3.57 MIL/uL — ABNORMAL LOW (ref 3.87–5.11)
RDW: 20.6 % — ABNORMAL HIGH (ref 11.5–15.5)
WBC: 13.1 10*3/uL — ABNORMAL HIGH (ref 4.0–10.5)
nRBC: 0 % (ref 0.0–0.2)

## 2018-10-06 LAB — TROPONIN I
TROPONIN I: 0.05 ng/mL — AB (ref <0.03)
Troponin I: 0.04 ng/mL (ref <0.03)

## 2018-10-06 SURGERY — IRRIGATION AND DEBRIDEMENT EXTREMITY
Anesthesia: General | Laterality: Left

## 2018-10-06 MED ORDER — ALTEPLASE 2 MG IJ SOLR: 2.0000 mg | Freq: Once | INTRAMUSCULAR | Status: DC | PRN

## 2018-10-06 MED ORDER — FENTANYL CITRATE (PF) 100 MCG/2ML IJ SOLN
INTRAMUSCULAR | Status: DC | PRN
  Administered 2018-10-06: 50 ug via INTRAVENOUS

## 2018-10-06 MED ORDER — HEPARIN SODIUM (PORCINE) 1000 UNIT/ML IJ SOLN
INTRAMUSCULAR | Status: AC
  Administered 2018-10-06: 3400 [IU]
  Filled 2018-10-06: qty 4

## 2018-10-06 MED ORDER — ONDANSETRON HCL 4 MG/2ML IJ SOLN
INTRAMUSCULAR | Status: DC | PRN
  Administered 2018-10-06: 4 mg via INTRAVENOUS

## 2018-10-06 MED ORDER — PHENYLEPHRINE 40 MCG/ML (10ML) SYRINGE FOR IV PUSH (FOR BLOOD PRESSURE SUPPORT)
PREFILLED_SYRINGE | INTRAVENOUS | Status: DC | PRN
  Administered 2018-10-06 (×3): 160 ug via INTRAVENOUS
  Administered 2018-10-06: 80 ug via INTRAVENOUS
  Administered 2018-10-06 (×2): 160 ug via INTRAVENOUS
  Administered 2018-10-06: 80 ug via INTRAVENOUS
  Administered 2018-10-06 (×2): 160 ug via INTRAVENOUS
  Administered 2018-10-06: 80 ug via INTRAVENOUS

## 2018-10-06 MED ORDER — HEPARIN SODIUM (PORCINE) 1000 UNIT/ML DIALYSIS
1000.0000 [IU] | INTRAMUSCULAR | Status: DC | PRN
  Administered 2018-10-06: 3400 [IU] via INTRAVENOUS_CENTRAL
  Filled 2018-10-06: qty 1

## 2018-10-06 MED ORDER — LIDOCAINE HCL (PF) 1 % IJ SOLN: 5.0000 mL | INTRAMUSCULAR | Status: DC | PRN

## 2018-10-06 MED ORDER — LIDOCAINE-PRILOCAINE 2.5-2.5 % EX CREA: 1.0000 "application " | TOPICAL_CREAM | CUTANEOUS | Status: DC | PRN

## 2018-10-06 MED ORDER — FENTANYL CITRATE (PF) 100 MCG/2ML IJ SOLN
INTRAMUSCULAR | Status: AC
  Filled 2018-10-06: qty 2

## 2018-10-06 MED ORDER — FENTANYL CITRATE (PF) 100 MCG/2ML IJ SOLN
25.0000 ug | INTRAMUSCULAR | Status: DC | PRN
  Administered 2018-10-06 (×2): 50 ug via INTRAVENOUS

## 2018-10-06 MED ORDER — MIDAZOLAM HCL 2 MG/2ML IJ SOLN
INTRAMUSCULAR | Status: AC
  Filled 2018-10-06: qty 2

## 2018-10-06 MED ORDER — FENTANYL CITRATE (PF) 250 MCG/5ML IJ SOLN
INTRAMUSCULAR | Status: AC
  Filled 2018-10-06: qty 5

## 2018-10-06 MED ORDER — PROPOFOL 10 MG/ML IV BOLUS
INTRAVENOUS | Status: DC | PRN
  Administered 2018-10-06: 120 mg via INTRAVENOUS

## 2018-10-06 MED ORDER — EPHEDRINE SULFATE-NACL 50-0.9 MG/10ML-% IV SOSY
PREFILLED_SYRINGE | INTRAVENOUS | Status: DC | PRN
  Administered 2018-10-06 (×5): 10 mg via INTRAVENOUS

## 2018-10-06 MED ORDER — NITROGLYCERIN 0.4 MG SL SUBL
SUBLINGUAL_TABLET | SUBLINGUAL | Status: AC
  Administered 2018-10-06: 0.4 mg via SUBLINGUAL
  Filled 2018-10-06: qty 1

## 2018-10-06 MED ORDER — DEXAMETHASONE SODIUM PHOSPHATE 10 MG/ML IJ SOLN
INTRAMUSCULAR | Status: DC | PRN
  Administered 2018-10-06: 5 mg via INTRAVENOUS

## 2018-10-06 MED ORDER — CALCITRIOL 0.25 MCG PO CAPS
ORAL_CAPSULE | ORAL | Status: AC
  Administered 2018-10-06: 0.25 ug via ORAL
  Filled 2018-10-06: qty 1

## 2018-10-06 MED ORDER — LIDOCAINE 2% (20 MG/ML) 5 ML SYRINGE
INTRAMUSCULAR | Status: DC | PRN
  Administered 2018-10-06: 60 mg via INTRAVENOUS

## 2018-10-06 MED ORDER — OXYCODONE HCL 5 MG PO TABS: 5.0000 mg | ORAL_TABLET | Freq: Once | ORAL | Status: DC | PRN

## 2018-10-06 MED ORDER — VANCOMYCIN HCL IN DEXTROSE 500-5 MG/100ML-% IV SOLN
INTRAVENOUS | Status: AC
  Administered 2018-10-06: 500 mg via INTRAVENOUS
  Filled 2018-10-06: qty 100

## 2018-10-06 MED ORDER — ONDANSETRON HCL 4 MG/2ML IJ SOLN: 4.0000 mg | Freq: Once | INTRAMUSCULAR | Status: DC | PRN

## 2018-10-06 MED ORDER — NITROGLYCERIN 0.4 MG SL SUBL
0.4000 mg | SUBLINGUAL_TABLET | SUBLINGUAL | Status: DC | PRN
  Administered 2018-10-06 (×2): 0.4 mg via SUBLINGUAL

## 2018-10-06 MED ORDER — OXYCODONE HCL 5 MG/5ML PO SOLN: 5.0000 mg | Freq: Once | ORAL | Status: DC | PRN

## 2018-10-06 MED ORDER — 0.9 % SODIUM CHLORIDE (POUR BTL) OPTIME
TOPICAL | Status: DC | PRN
  Administered 2018-10-06: 1000 mL

## 2018-10-06 MED ORDER — MIDAZOLAM HCL 5 MG/5ML IJ SOLN
INTRAMUSCULAR | Status: DC | PRN
  Administered 2018-10-06: 1 mg via INTRAVENOUS

## 2018-10-06 MED ORDER — VANCOMYCIN HCL IN DEXTROSE 500-5 MG/100ML-% IV SOLN
500.0000 mg | INTRAVENOUS | Status: AC
  Administered 2018-10-06: 500 mg via INTRAVENOUS

## 2018-10-06 SURGICAL SUPPLY — 46 items
ADH SKN CLS APL DERMABOND .7 (GAUZE/BANDAGES/DRESSINGS) ×1
BANDAGE ACE 4X5 VEL STRL LF (GAUZE/BANDAGES/DRESSINGS) IMPLANT
BANDAGE ACE 6X5 VEL STRL LF (GAUZE/BANDAGES/DRESSINGS) IMPLANT
BIOPATCH RED 1 DISK 7.0 (GAUZE/BANDAGES/DRESSINGS) ×1 IMPLANT
BIOPATCH RED 1IN DISK 7.0MM (GAUZE/BANDAGES/DRESSINGS) ×1
BNDG GAUZE ELAST 4 BULKY (GAUZE/BANDAGES/DRESSINGS) IMPLANT
CANISTER SUCT 3000ML PPV (MISCELLANEOUS) ×3 IMPLANT
COVER PROBE W GEL 5X96 (DRAPES) ×2 IMPLANT
COVER SURGICAL LIGHT HANDLE (MISCELLANEOUS) ×6 IMPLANT
COVER WAND RF STERILE (DRAPES) ×3 IMPLANT
DERMABOND ADVANCED (GAUZE/BANDAGES/DRESSINGS) ×2
DERMABOND ADVANCED .7 DNX12 (GAUZE/BANDAGES/DRESSINGS) IMPLANT
DRAIN CHANNEL 15F RND FF W/TCR (WOUND CARE) ×2 IMPLANT
DRAPE HALF SHEET 40X57 (DRAPES) ×3 IMPLANT
DRAPE IMP U-DRAPE 54X76 (DRAPES) ×2 IMPLANT
DRAPE INCISE IOBAN 66X45 STRL (DRAPES) IMPLANT
DRAPE ORTHO SPLIT 77X108 STRL (DRAPES) ×3
DRAPE SURG ORHT 6 SPLT 77X108 (DRAPES) IMPLANT
ELECT REM PT RETURN 9FT ADLT (ELECTROSURGICAL) ×3
ELECTRODE REM PT RTRN 9FT ADLT (ELECTROSURGICAL) ×1 IMPLANT
EVACUATOR SILICONE 100CC (DRAIN) ×2 IMPLANT
GAUZE SPONGE 2X2 8PLY STRL LF (GAUZE/BANDAGES/DRESSINGS) IMPLANT
GAUZE SPONGE 4X4 12PLY STRL (GAUZE/BANDAGES/DRESSINGS) ×3 IMPLANT
GLOVE BIO SURGEON STRL SZ7.5 (GLOVE) ×3 IMPLANT
GLOVE BIOGEL PI IND STRL 8 (GLOVE) ×1 IMPLANT
GLOVE BIOGEL PI INDICATOR 8 (GLOVE) ×2
GOWN STRL REUS W/ TWL LRG LVL3 (GOWN DISPOSABLE) ×2 IMPLANT
GOWN STRL REUS W/ TWL XL LVL3 (GOWN DISPOSABLE) ×2 IMPLANT
GOWN STRL REUS W/TWL LRG LVL3 (GOWN DISPOSABLE) ×6
GOWN STRL REUS W/TWL XL LVL3 (GOWN DISPOSABLE) ×6
KIT BASIN OR (CUSTOM PROCEDURE TRAY) ×3 IMPLANT
KIT TURNOVER KIT B (KITS) ×3 IMPLANT
NS IRRIG 1000ML POUR BTL (IV SOLUTION) ×3 IMPLANT
PACK GENERAL/GYN (CUSTOM PROCEDURE TRAY) ×2 IMPLANT
PAD ARMBOARD 7.5X6 YLW CONV (MISCELLANEOUS) ×6 IMPLANT
SPONGE GAUZE 2X2 STER 10/PKG (GAUZE/BANDAGES/DRESSINGS) ×2
STOCKINETTE 6  STRL (DRAPES) ×2
STOCKINETTE 6 STRL (DRAPES) IMPLANT
SUT ETHILON 3 0 PS 1 (SUTURE) ×2 IMPLANT
SUT MNCRL AB 4-0 PS2 18 (SUTURE) ×2 IMPLANT
SUT VIC AB 2-0 CTX 36 (SUTURE) IMPLANT
SUT VIC AB 3-0 SH 27 (SUTURE) ×3
SUT VIC AB 3-0 SH 27X BRD (SUTURE) IMPLANT
TAPE CLOTH SURG 4X10 WHT LF (GAUZE/BANDAGES/DRESSINGS) ×2 IMPLANT
TOWEL GREEN STERILE (TOWEL DISPOSABLE) ×3 IMPLANT
WATER STERILE IRR 1000ML POUR (IV SOLUTION) ×1 IMPLANT

## 2018-10-06 NOTE — Procedures (Signed)
I was present at this dialysis session. I have reviewed the session itself and made appropriate changes.   VVS to eval arm for infection.  Having on/off CP with HD. Fevers overnight. UF goal 2L.  TDC. 2K.  BC NGTD.  On IV Vanc / Fluconazole  Filed Weights   10/02/18 1153 10/02/18 1529 10/05/18 2018  Weight: 61 kg 60 kg 60.2 kg    Recent Labs  Lab 10/06/18 0716  NA 130*  K 4.7  CL 93*  CO2 19*  GLUCOSE 102*  BUN 69*  CREATININE 9.68*  CALCIUM 8.1*  PHOS 8.7*    Recent Labs  Lab 10/04/18 0537 10/05/18 0621 10/06/18 0716  WBC 12.6* 13.1* 13.1*  HGB 7.7* 8.2* 8.0*  HCT 24.0* 26.4* 25.5*  MCV 71.2* 71.5* 71.4*  PLT 171 225 268    Scheduled Meds: . amLODipine  5 mg Oral QHS  . calcitRIOL  0.25 mcg Oral Q T,Th,Sa-HD  . Chlorhexidine Gluconate Cloth  6 each Topical Q0600  . [START ON 10/07/2018] darbepoetin (ARANESP) injection - DIALYSIS  40 mcg Intravenous Q Tue-HD  . feeding supplement (PRO-STAT SUGAR FREE 64)  30 mL Oral BID  . fluconazole  200 mg Oral Daily  . heparin injection (subcutaneous)  5,000 Units Subcutaneous Q8H  . insulin aspart  0-5 Units Subcutaneous QHS  . insulin aspart  0-9 Units Subcutaneous TID WC  . insulin glargine  5 Units Subcutaneous QHS  . multivitamin  1 tablet Oral QHS  . nitroGLYCERIN      . sodium chloride flush  3 mL Intravenous Q12H  . vancomycin  500 mg Intravenous Q T,Th,Sa-HD   Continuous Infusions: . sodium chloride Stopped (10/02/18 2131)  . ferric gluconate (FERRLECIT/NULECIT) IV Stopped (10/02/18 1550)   PRN Meds:.acetaminophen, alteplase, alum & mag hydroxide-simeth, camphor-menthol, diphenhydrAMINE-zinc acetate, heparin, lidocaine (PF), lidocaine-prilocaine, nitroGLYCERIN, ondansetron (ZOFRAN) IV, prochlorperazine, simethicone   Pearson Grippe  MD 10/06/2018, 8:08 AM

## 2018-10-06 NOTE — Anesthesia Procedure Notes (Signed)
Procedure Name: LMA Insertion Date/Time: 10/06/2018 2:52 PM Performed by: Gwyndolyn Saxon, CRNA Pre-anesthesia Checklist: Patient identified, Emergency Drugs available, Suction available and Patient being monitored Patient Re-evaluated:Patient Re-evaluated prior to induction Oxygen Delivery Method: Circle system utilized Preoxygenation: Pre-oxygenation with 100% oxygen Induction Type: IV induction Ventilation: Mask ventilation without difficulty LMA: LMA inserted LMA Size: 4.0 Number of attempts: 1 Placement Confirmation: positive ETCO2 and breath sounds checked- equal and bilateral Tube secured with: Tape Dental Injury: Teeth and Oropharynx as per pre-operative assessment

## 2018-10-06 NOTE — Progress Notes (Addendum)
Subjective: Rhonda Lynch was seen and evaluated while in dialysis. At that time she complained of active chest pain that worsened when she began HD (please see additional note from Dr. Frederico Hamman). States this is is similar to her pain yesterday. It is substernal and non-radiating. She has a difficult time describing it. No associated diaphoresis, n/v or shortness of breath.   Objective:  Vital signs in last 24 hours: Vitals:   10/06/18 0830 10/06/18 0900 10/06/18 0930 10/06/18 1000  BP: (!) 162/85 (!) 148/82 134/77 (!) 145/73  Pulse: (!) 105 (!) 105 (!) 107 89  Resp:      Temp:      TempSrc:      SpO2:      Weight:      Height:       General: awake, alert, in acute distress due to chest pain CV: tachycardic, regular rhythm  Pulm: Chest wall TTP along left parasternal border and intercostal space Rest of exam deferred in order to obtain EKG  Assessment/Plan:  Principal Problem:   ESRD (end stage renal disease) ([facility]) Active Problems:   Microcytic anemia   Fever   Hypertension   Abdominal pain   Type 2 diabetes mellitus (HCC)   Chronic hepatitis C virus genotype 1b infection (HCC)   Left arm pain   Surgical site infection  Rhonda Lynch is a 63 yo female with a hx significant for 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 Undergoing CLIP process now.  1.ESRD on Dialysis 2/2 ANCA Vasculitis  Hyperkalemia -Stable - Appreciate nephrology managing inpatient dialysis; undergoing CLIP processfor outpatient dialysis placement. Unfortunately a prolonged process due to history of non-compliance in the past - patient apparently refused HD over the weekend so going this morning  2. Acute chest pain - chest pain recurred while in  dialysis - initial troponin 0.05. Repeat EKG did not show acute ischemia, but does have diffuse PR depression. Given her missed HD over the weekend and elevated BUN to 69 suspect this may be due to pericarditis - CXR done yesterday was unchanged from admission  - she is still high risk for ACS so have asked cardiology to consult for further recommendations - will continue GI cocktail and Gas-X prn as there also seems to be a GI component   3.Suspected LUE cellulitis: -continues to spike fevers  -She is post op day7after left second stage brachiobasilic fistula   - blood cx show NGTD - LUE u/s performed yesterday showed possible phlegmon versus earl abscess formation - appreciate Dr. Carlis Abbott with vascular following; plans to take her to OR this afternoon for washout and cultures  - will continue Vancomycin; no indication to broaden at this time   4.HTN -blood pressures stable on amlodipine.   5.Type 2 DM: - CBGs well controlled on Lantus 5 and SSI  6.Anemia of CKD - hgb has remained stable - iron studies are on low end of normal - loaded with iron and started on ESA per nephrology   7.ActiveHep CInfection: - genotype 1b - follow-up in Hep C clinic outpatient  - denies risk factors; HIV non-reactive  8. 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   Rhonda Nunnery D, DO 10/06/2018, 10:30 AM Pager:  [phone]  

## 2018-10-06 NOTE — Progress Notes (Signed)
Patient looks uncomfortable in Short Stay. Patient reports severe chest pain 8 on 0- 10 scale. Reports pain increases with palpation and reports intermittent shortness of breath. She reports the pain is as previously described to admitting MD. Dr. Fransisco Beau made aware. Sinus tach on the monitor.

## 2018-10-06 NOTE — Progress Notes (Signed)
Patient seen during HD this AM during rounds. She was complaining of severe chest pain and appeared very uncomfortable. No associated SOB,N/V or diaphoresis. She was very tender to palpation over chest wall and was tachycardic with HR 110s on exam but otherwise unremarkable cardiac exam. She is oxygenating well on RA. I asked her to sit up and lean forward to see if that relieved the pain, but she declined.   She did complain of similar pain yesterday but reported improvement with belching. She was also TTP over chest wall at that time as well as tachycardic. EKG x2 obtained and no signs of acute ischemai seen.  Troponin 0.08 from 0.16 on admission. Did not repeat troponin as chest pain had been ongoing for >12h. We started Maalox and simethicone but she did not receive these medications.   EKG from this AM did not show any signs of acute ischemia, but she does have diffuse PR interval depression. First troponin today is 0.05. Of note, she declined HD yesterday, her las HD was on 1/23. Blood work this AM showed BUN 69.   Suspect chest pain is secondary to pericarditis in the setting of uremia. However, given she is at high risk for ACS will consult cardiology.   Welford Roche, MD  Internal Medicine PGY-2  P [phone]

## 2018-10-06 NOTE — Anesthesia Postprocedure Evaluation (Signed)
Anesthesia Post Note  Patient: Rhonda Lynch  Procedure(s) Performed: IRRIGATION AND DEBRIDEMENT ARM (Left )     Patient location during evaluation: PACU Anesthesia Type: General Level of consciousness: awake and alert Pain management: pain level controlled Vital Signs Assessment: post-procedure vital signs reviewed and stable Respiratory status: spontaneous breathing, nonlabored ventilation, respiratory function stable and patient connected to nasal cannula oxygen Cardiovascular status: blood pressure returned to baseline and stable Postop Assessment: no apparent nausea or vomiting Anesthetic complications: no    Last Vitals:  Vitals:   10/06/18 1550 10/06/18 1605  BP: 111/64 120/79  Pulse: (!) 114 (!) 112  Resp: 20 20  Temp:  37 C  SpO2: 94% 100%    Last Pain:  Vitals:   10/06/18 1605  TempSrc:   PainSc: 3                  Audry Pili

## 2018-10-06 NOTE — Transfer of Care (Signed)
Immediate Anesthesia Transfer of Care Note  Patient: Rhonda Lynch  Procedure(s) Performed: IRRIGATION AND DEBRIDEMENT ARM (Left )  Patient Location: PACU  Anesthesia Type:General  Level of Consciousness: awake and alert   Airway & Oxygen Therapy: Patient Spontanous Breathing  Post-op Assessment: Report given to RN and Post -op Vital signs reviewed and stable  Post vital signs: Reviewed and stable  Last Vitals:  Vitals Value Taken Time  BP 110/69 10/06/2018  3:36 PM  Temp 36.9 C 10/06/2018  3:36 PM  Pulse 119 10/06/2018  3:40 PM  Resp 19 10/06/2018  3:40 PM  SpO2 95 % 10/06/2018  3:40 PM  Vitals shown include unvalidated device data.  Last Pain:  Vitals:   10/06/18 1128  TempSrc: Oral  PainSc:       Patients Stated Pain Goal: 0 (14/43/15 4008)  Complications: No apparent anesthesia complications

## 2018-10-06 NOTE — Progress Notes (Signed)
Pharmacy Antibiotic Note  Rhonda Lynch is a 63 y.o. female admitted on 09/22/2018 with concern for L-arm abscess around AVF site - VVS planning washout and cultures.  Pharmacy has been consulted for Vancomycin dosing.   The patient was loaded with Vancomycin post-HD on 1/23, missed HD on 1/25 and is receiving HD off-schedule this morning. Will adjust Vancomycin maintenance dose to be given today and will follow-up with further HD plans this week.   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   Plan: - Vancomycin 500 mg post HD x 1 dose today - Cont Vanc 500 mg/HD-TTS - Will continue to follow HD schedule/duration, culture results, LOT, and antibiotic de-escalation plans   Height: 5\' 1"  (154.9 cm) Weight: 132 lb 11.5 oz (60.2 kg) IBW/kg (Calculated) : 47.8  Temp (24hrs), Avg:100.4 F (38 C), Min:99.2 F (37.3 C), Max:101.9 F (38.8 C)  Recent Labs  Lab 09/30/18 0416 10/02/18 1130 10/02/18 1200 10/03/18 0322 10/04/18 0537 10/05/18 0621 10/06/18 0716  WBC 12.8* 11.3*  --  8.8 12.6* 13.1* 13.1*  CREATININE 6.51*  --  6.01*  --   --   --  9.68*    Estimated Creatinine Clearance: 4.9 mL/min (A) (by C-G formula based on SCr of 9.68 mg/dL (H)).    Allergies  Allergen Reactions  . Acetaminophen Nausea And Vomiting    Patient states Acetaminophen and Acetaminophen containing products make her nauseated. She demonstrated this 06/21/18 with nausea followed by emesis.    Antimicrobials this admission: 1/23 vanc>> Chronic fluconazole   Dose adjustments this admission:   Microbiology results: 1/13 Bcx: ngF 1/13 MRSA PCR >> neg 1/23 blood>> NGTD  Thank you for allowing pharmacy to be a part of this patient's care.  Alycia Rossetti, PharmD, BCPS Clinical Pharmacist Clinical phone for 10/06/2018: [phone] 10/06/2018 8:26 AM   **Pharmacist phone directory can now be found on [email or web address] ([email or web address]).  Listed under [place].

## 2018-10-06 NOTE — Op Note (Signed)
Date: October 06, 2018  Preoperative diagnosis: Persistent fevers after left second stage brachiobasilic AV fistula with concern for left arm abscess  Postoperative diagnosis: Seroma  Procedure: Incision and drainage of left arm fluid collection with placement of 15 French round Blake drain  Surgeon: Dr. Marty Heck, MD  Assistant: OR staff  Indications: Patient is a 63 year old female who underwent left upper extremity brachiobasilic fistula placement as a second stage procedure last week.  She has had some persistent pain and swelling around her antecubital incision as well as persistent fevers.  Duplex was obtained over the weekend with concern for developing abscess around the fistula.  She presents today for incision and drainage after risks and benefits were discussed.  Findings: No purulent drainage identified.  The fluid collection noted on ultrasound that was identified in the operating room around the fistula was clear serous appearing fluid with no foul odor.  Gram stain and cultures were sent x2.  I did place a 55 [place] round Blake drain in the wound bed pending culture results.  There was a good thrill in the fistula at the completion of the case.  Details: Patient was taken to the operating room after informed consent was obtained.  She was placed on the operating table in spine position.  After anesthesia was induced her left arm was prepped and draped in sterile fashion.  I used preop timeout to identify patient, procedure and site.  Initially used sterile ultrasound and identified the fluid collection near her antecubital incision near the arterial anastomosis that was identified on ultrasound from the weekend.  Ultimately I then used a 15 blade scalpel and opened the one incision near this fluid collection just above her antecubitum.  TMetzenbaum scissors were used to open the deep Vicryl sutures.  Immediately we encountered clear fluid in small pocket adjacent to the  fistula.  Culture and Gram stain were sent x2.  There was no foul odor or purulent drainage.  I did explore the tunnel as well as the upper arm and found no other pockets of fluid.  The wound was irrigated with saline.  I then placed a 15 [place] round Blake drain through a counterincision that was secured with a 3-0 nylon.  Then closed the skin again with a 3-0 Vicryl for the subcutaneous tissue and 4-0 Monocryl in the skin and Dermabond was applied.  Condition: Stable  Marty Heck, MD [facility] Office: [phone] Pager: [place]

## 2018-10-06 NOTE — Progress Notes (Signed)
[facility]  Subjective  - Persistent fevers through the weekend.  BC no growth.  Objective 131/65 (!) 107 99.2 F (37.3 C) (Oral) 18 98%  Intake/Output Summary (Last 24 hours) at 10/06/2018 0736 Last data filed at 10/06/2018 0515 Gross per 24 hour  Intake 240 ml  Output 0 ml  Net 240 ml    Left brachiobasilic fistula with good thrill Palpable left radial pulse No motor or sensory deficit in left hand Left hand warm Some fullness near antecutium  Laboratory Lab Results: Recent Labs    10/04/18 0537 10/05/18 0621  WBC 12.6* 13.1*  HGB 7.7* 8.2*  HCT 24.0* 26.4*  PLT 171 225   BMET No results for input(s): NA, K, CL, CO2, GLUCOSE, BUN, CREATININE, CALCIUM in the last 72 hours.  COAG Lab Results  Component Value Date   INR 0.96 09/29/2018   INR 0.97 07/23/2018   INR 0.98 07/22/2018   No results found for: PTT  Assessment/Planning:  POD#7 s/p left second stage brachiobasilic fistula.  Persistent fevers.  [place] left arm concern for phlegmon vs abscess left arm.  Will discuss with IM today.  Likely plan for left arm washout and cultures.  Please keep NPO.  Rhonda Lynch 10/06/2018 7:36 AM --

## 2018-10-06 NOTE — Anesthesia Preprocedure Evaluation (Addendum)
Anesthesia Evaluation  Patient identified by MRN, date of birth, ID band Patient awake    Reviewed: Allergy & Precautions, NPO status , Patient's Chart, lab work & pertinent test results  History of Anesthesia Complications Negative for: history of anesthetic complications  Airway Mallampati: III  TM Distance: >3 FB Neck ROM: Full    Dental  (+) Dental Advisory Given,    Pulmonary shortness of breath, Current Smoker,   Hx BOOP    breath sounds clear to auscultation       Cardiovascular hypertension, Pt. on home beta blockers and Pt. on medications + dysrhythmias Supra Ventricular Tachycardia  Rhythm:Regular Rate:Tachycardia   '19 TEE - EF 40% to 45%. Mild diffuse hypokinesis. Mild MR. Mild-mod dilated LA and RA. Moderate eccentric TR directed towards the interatrial septum. Suspicious for possible TV vegetation.     Neuro/Psych PSYCHIATRIC DISORDERS CVA    GI/Hepatic negative GI ROS, (+)     substance abuse  alcohol use and cocaine use, Hepatitis -, C  Endo/Other  diabetes, Insulin Dependent Hyponatremia Hypochloremia   Renal/GU ESRF and DialysisRenal disease  negative genitourinary   Musculoskeletal negative musculoskeletal ROS (+)   Abdominal   Peds  Hematology  (+) anemia ,   Anesthesia Other Findings   Reproductive/Obstetrics                            Anesthesia Physical  Anesthesia Plan  ASA: IV and emergent  Anesthesia Plan: General   Post-op Pain Management:    Induction: Intravenous  PONV Risk Score and Plan: 3 and Ondansetron, Midazolam and Treatment may vary due to age or medical condition  Airway Management Planned: LMA  Additional Equipment: None  Intra-op Plan:   Post-operative Plan: Extubation in OR  Informed Consent: I have reviewed the patients History and Physical, chart, labs and discussed the procedure including the risks, benefits and  alternatives for the proposed anesthesia with the patient or authorized representative who has indicated his/her understanding and acceptance.     Dental advisory given  Plan Discussed with: CRNA, Anesthesiologist and Surgeon  Anesthesia Plan Comments: ( Patient complaining of chest pain in preop, 8/10 in severity. Chest pain is pleuritic in nature, with tenderness to palpation. Troponins drawn have been flat, EKG with PR depression but otherwise no suggestion of ischemia. Primary team had requested cardiology evaluation given patient's risks for ischemia, but history/physical/EKG/labs pointing towards pericarditis rather than ischemia. Discussed with patient and surgeon at length, will proceed with case.)      Anesthesia Quick Evaluation

## 2018-10-07 ENCOUNTER — Encounter (HOSPITAL_COMMUNITY): Payer: Self-pay | Admitting: Vascular Surgery

## 2018-10-07 DIAGNOSIS — Z978 Presence of other specified devices: Secondary | ICD-10-CM

## 2018-10-07 LAB — CULTURE, BLOOD (ROUTINE X 2)
Culture: NO GROWTH
Culture: NO GROWTH
Special Requests: ADEQUATE
Special Requests: ADEQUATE

## 2018-10-07 LAB — GLUCOSE, CAPILLARY
GLUCOSE-CAPILLARY: 225 mg/dL — AB (ref 70–99)
Glucose-Capillary: 333 mg/dL — ABNORMAL HIGH (ref 70–99)
Glucose-Capillary: 215 mg/dL — ABNORMAL HIGH (ref 70–99)
Glucose-Capillary: 210 mg/dL — ABNORMAL HIGH (ref 70–99)
Glucose-Capillary: 190 mg/dL — ABNORMAL HIGH (ref 70–99)

## 2018-10-07 MED ORDER — CALCITRIOL 0.25 MCG PO CAPS
0.2500 ug | ORAL_CAPSULE | ORAL | Status: DC
  Administered 2018-10-08 – 2018-10-13 (×3): 0.25 ug via ORAL

## 2018-10-07 MED ORDER — DIPHENHYDRAMINE HCL 12.5 MG/5ML PO ELIX
6.2500 mg | ORAL_SOLUTION | ORAL | Status: AC | PRN
  Administered 2018-10-07 – 2018-10-08 (×2): 6.25 mg via ORAL
  Filled 2018-10-07 (×4): qty 5

## 2018-10-07 MED ORDER — DARBEPOETIN ALFA 60 MCG/0.3ML IJ SOSY: 60.0000 ug | PREFILLED_SYRINGE | INTRAMUSCULAR | Status: DC

## 2018-10-07 MED ORDER — VANCOMYCIN HCL IN DEXTROSE 500-5 MG/100ML-% IV SOLN
500.0000 mg | INTRAVENOUS | Status: DC
  Administered 2018-10-08: 500 mg via INTRAVENOUS

## 2018-10-07 MED ORDER — CHLORHEXIDINE GLUCONATE CLOTH 2 % EX PADS
6.0000 | MEDICATED_PAD | Freq: Every day | CUTANEOUS | Status: DC
  Administered 2018-10-09: 6 via TOPICAL

## 2018-10-07 MED ORDER — CYCLOBENZAPRINE HCL 5 MG PO TABS
5.0000 mg | ORAL_TABLET | Freq: Two times a day (BID) | ORAL | Status: DC | PRN
  Administered 2018-10-07 – 2018-10-12 (×7): 5 mg via ORAL
  Filled 2018-10-07 (×7): qty 1

## 2018-10-07 MED ORDER — DARBEPOETIN ALFA 60 MCG/0.3ML IJ SOSY
60.0000 ug | PREFILLED_SYRINGE | INTRAMUSCULAR | Status: DC
  Administered 2018-10-08: 60 ug via INTRAVENOUS
  Filled 2018-10-07: qty 0.3

## 2018-10-07 MED ORDER — OXYCODONE HCL 5 MG PO TABS
5.0000 mg | ORAL_TABLET | Freq: Once | ORAL | Status: AC
  Administered 2018-10-07: 5 mg via ORAL
  Filled 2018-10-07: qty 1

## 2018-10-07 MED ORDER — HYDROMORPHONE HCL 1 MG/ML IJ SOLN
0.5000 mg | Freq: Once | INTRAMUSCULAR | Status: DC
  Filled 2018-10-07: qty 1

## 2018-10-07 MED ORDER — RAMELTEON 8 MG PO TABS
8.0000 mg | ORAL_TABLET | Freq: Once | ORAL | Status: DC
  Filled 2018-10-07: qty 1

## 2018-10-07 MED ORDER — HYDROXYZINE HCL 25 MG PO TABS
25.0000 mg | ORAL_TABLET | Freq: Once | ORAL | Status: AC
  Administered 2018-10-07: 25 mg via ORAL
  Filled 2018-10-07: qty 1

## 2018-10-07 NOTE — Clinical Social Work Note (Signed)
CSW talked with Rhonda Lynch regarding going a an HD Center in [place]. Patient expressed that she has no transportation and this was discussed. Transportation resources will be provided. [facility] Placement Request form completed and faxed for consideration of out patient dialysis at a [facility]. CSW received a call from [place] with [place] admissions and CSW informed that the [facility] they place to is in [place]. CSW advised that direct contact will have to be made with Centers in [facility].   Call made to [place] and clinicals faxed.   Kyndal Gloster Givens, MSW, LCSW Licensed Clinical Social Worker [place] [phone]

## 2018-10-07 NOTE — Progress Notes (Signed)
[facility]  Subjective  - States left arm feels better.  Upset about not getting food overnight.   Objective 113/67 (!) 102 98.9 F (37.2 C) 18 93%  Intake/Output Summary (Last 24 hours) at 10/07/2018 0806 Last data filed at 10/07/2018 7408 Gross per 24 hour  Intake 620 ml  Output 1722 ml  Net -1102 ml    Left brachiobasilic fistula with good thrill No hematoma or swelling - resolved Drain with serosanguinous drainage No cellulitis  Laboratory Lab Results: Recent Labs    10/05/18 0621 10/06/18 0716  WBC 13.1* 13.1*  HGB 8.2* 8.0*  HCT 26.4* 25.5*  PLT 225 268   BMET Recent Labs    10/06/18 0716  NA 130*  K 4.7  CL 93*  CO2 19*  GLUCOSE 102*  BUN 69*  CREATININE 9.68*  CALCIUM 8.1*    COAG Lab Results  Component Value Date   INR 0.96 09/29/2018   INR 0.97 07/23/2018   INR 0.98 07/22/2018   No results found for: PTT  Assessment/Planning: POD#1 s/p evacuation of left arm fluid collection around brachiobasilic fistula in setting of persistent fevers.  No growth on cultures and appeared to be serous fluid in OR.  Will likely remove drain tomorrow.  Fistula with good thrill.   Marty Heck 10/07/2018 8:06 AM --

## 2018-10-07 NOTE — Progress Notes (Signed)
Patient walked to this nurse saying her left arm wet and painful. She was having drain on that arm but was missing, asked what happened.  She couldn't give any good explanation and didn't know how it happened.  Notified Vascular Dr. Scot Dock and also Internal medicine.  Gave instruction to put a guaze with tape.  Changed the dressing with guaze and tape.  Will continue to monitor.

## 2018-10-07 NOTE — Progress Notes (Signed)
   Subjective: Rhonda Lynch was quite upset this morning and is having a difficult time coping with dialysis. Much of our visit was spent listening to her frustrations and all she has been going through. I encouraged to take time to think about continuing dialysis before deciding to stop it altogether which she was agreeable to.  Her left arm feels much better since having drained in OR. Chest pain has also resolved, but notes it tightens when she develops anxiety.   Objective:  Vital signs in last 24 hours: Vitals:   10/06/18 1605 10/06/18 1637 10/06/18 2028 10/07/18 0934  BP: 120/79 114/86 113/67 94/61  Pulse: (!) 112 (!) 113 (!) 102 90  Resp: 20  18 16   Temp: 98.6 F (37 C) 98.7 F (37.1 C) 98.9 F (37.2 C) 98.2 F (36.8 C)  TempSrc:  Oral  Oral  SpO2: 100% 97% 93% 98%  Weight:   58 kg   Height:       General: awake, alert, sitting up in bed, tearful on exam CV: RRR; no murmurs, rubs or gallops Pulm: normal respiratory effort; lungs CTA bilaterally  Assessment/Plan:  Principal Problem:   ESRD (end stage renal disease) (HCC) Active Problems:   Microcytic anemia   Fever   Hypertension   Abdominal pain   Type 2 diabetes mellitus (HCC)   Chronic hepatitis C virus genotype 1b infection (HCC)   Left arm pain   Surgical site infection  Rhonda Lynch is a 63 yo female with a hx significant for 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 Undergoing CLIP process now.  1.ESRD on Dialysis 2/2 ANCA Vasculitis  Hyperkalemia -Stable - Appreciate nephrology managing inpatient dialysis; undergoing CLIP processfor outpatient dialysis placement. Unfortunately a prolonged process due to history of non-compliance in the past - currently  having hesitations about pursuing HD long-term, but have request chaplain services to help her with coping with this difficult transition; will proceed with HD tomorrow  2. Acute chest pain - ischemic eval was unremarkable - currently resolved - based on diffuse PR depression, possible etiology includes pericarditis; if pain returns will treat with colchicine - today she mostly attributes her pain to anxiety  - will continue GI cocktail and Gas-X prn as there also seems to be a GI component   3.Suspected LUE cellulitis: - fevers have resolved and left arm pain is much improved with drain in place -appreciate vascular following; she was taken to the OR yesterday and seroma evacuated; wound cultures were sent and are pending - she has received 5 days of Vancomycin; will discontinue antibiotic therapy for now and monitor for fever  - blood cx show NGTD  4.HTN -blood pressures stable on amlodipine.   5.Type 2 DM: - CBGs well controlled on Lantus 5 and SSI  6.Anemia of CKD - hgb has remained stable - iron studies are on low end of normal - loaded with iron and started on ESA per nephrology   7.ActiveHep CInfection: - genotype 1b - follow-up in Hep C clinic outpatient  - denies risk factors; HIV non-reactive  8. 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   Rhonda Nunnery D, DO 10/07/2018, 1:54 PM Pager: [phone]

## 2018-10-07 NOTE — Progress Notes (Signed)
[facility] Progress Note   Dialysis Orders: **She does NOT have an active HD unit, will need to undergo CLIP process again** Prev dialyzed at [facility] (4hr, EDW 59kg, 2K/2.25Ca, TDC, no heparin)  S/p L 2nd stage AVF on 01/20/ 20  DC orders - reduce time to 3.5 hours, use tight heparin   Problem/Plan: 1. Fevers -betterBCno growth so far,admit team work up ,  CXR neg infiltrateHx Fungemia w/possible TV involvement - Per ID in 06/2018, recommended Fluconazole indefinitely .- I and D of left arm seroma with drain 1/27 feels better- wound culture pending - on Vanc IV 2. Uremia/Hyperkalemia:d/t missed HD ,resolved with adequate HD 3. ESRD -HDTTS here but got off schedule and ran Monday instead - plan next HD Wed.  Has some degree of renal function. Will set time at 3.5 hours going forward per discussion with Dr. Moshe Cipro. CLIP pending 4. Anemia of CKDwith Fe deficiency-Hgb8- continue to trend ,Low Tsat, startedFe load.-on ESA started at 40 with no significant response - ^ dose for Wed 5. Secondary hyperparathyroidism -Ca/P ok, PTH 211 on low dose calcitriol. Follow trends.No binder yet 6. HTN/volume -net UF 1.7 Monday with post wt 58 7. Nutrition -Alb low diet liberalized to renal. Protein supplements, vitamins.-past 24hr poor appetite with fever - alb could be low in part due to liver disease. 8. 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  9. Dialysis access- L IJ TDC,. S/p L 2nd stage AVF on 1/20 s/p I and D of seroma 1/27 10. Hep C positive- active infection - per primary 11.Dispo-CLIP pending turned down by [facility] and [facility] due to prior noncompliance. CLIP sending out info to other units.  12. Chest pain on HD yesterday- resolved - trop flat - she thinks secondary to stress.  Myriam Jacobson, PA-C [facility] Beeper [phone] 10/07/2018,8:59 AM  LOS: 15 days   Subjective:   No problems with HD yesterday except CP -  she thinks secondary to stress  Objective Vitals:   10/06/18 1550 10/06/18 1605 10/06/18 1637 10/06/18 2028  BP: 111/64 120/79 114/86 113/67  Pulse: (!) 114 (!) 112 (!) 113 (!) 102  Resp: 20 20  18   Temp:  98.6 F (37 C) 98.7 F (37.1 C) 98.9 F (37.2 C)  TempSrc:   Oral   SpO2: 94% 100% 97% 93%  Weight:    58 kg  Height:       Physical Exam General: NAD walking in room Heart: tachy reg ~100 Lungs: no rales Abdomen: soft Extremities: no LE edmea Dialysis Access:  Left upper AVF less swollen - drain in tact + bruit right IJ [facility]   Additional Objective Labs: Basic Metabolic Panel: Recent Labs  Lab 10/02/18 1200 10/06/18 0716  NA 132* 130*  K 5.0 4.7  CL 101 93*  CO2 22 19*  GLUCOSE 114* 102*  BUN 50* 69*  CREATININE 6.01* 9.68*  CALCIUM 8.3* 8.1*  PHOS 3.3 8.7*   Liver Function Tests: Recent Labs  Lab 10/02/18 1200 10/06/18 0716  ALBUMIN 2.5* 2.1*   No results for input(s): LIPASE, AMYLASE in the last 168 hours. CBC: Recent Labs  Lab 10/02/18 1130 10/03/18 0322 10/04/18 0537 10/05/18 0621 10/06/18 0716  WBC 11.3* 8.8 12.6* 13.1* 13.1*  HGB 7.9* 7.6* 7.7* 8.2* 8.0*  HCT 26.4* 25.0* 24.0* 26.4* 25.5*  MCV 73.3* 72.7* 71.2* 71.5* 71.4*  PLT 233 169 171 225 268   Blood Culture    Component Value Date/Time   SDES  WOUND LEFT ARM 10/06/2018 1510   SPECREQUEST NONE 10/06/2018 1510   CULT PENDING 10/06/2018 1510   REPTSTATUS PENDING 10/06/2018 1510    Cardiac Enzymes: Recent Labs  Lab 10/05/18 0835 10/06/18 0812 10/06/18 1657  TROPONINI 0.08* 0.05* 0.04*   CBG: Recent Labs  Lab 10/06/18 1542 10/06/18 1641 10/06/18 2027 10/07/18 0109 10/07/18 0734  GLUCAP 81 126* 313* 333* 225*   Iron Studies: No results for input(s): IRON, TIBC, TRANSFERRIN, FERRITIN in the last 72 hours. Lab Results  Component Value Date   INR 0.96 09/29/2018   INR 0.97 07/23/2018   INR 0.98 07/22/2018   Studies/Results: Dg Chest 2 View  Result Date:  10/05/2018 CLINICAL DATA:  Fever and central chest pain. EXAM: CHEST - 2 VIEW COMPARISON:  None. FINDINGS: Stable cardiac enlargement. Stable positioning of right-sided tunneled dialysis catheter. Stable mild pulmonary interstitial prominence without overt edema or pleural effusions. No focal airspace consolidation or pneumothorax. Visualized bony structures are unremarkable. IMPRESSION: Stable mild pulmonary interstitial prominence without overt edema. Stable positioning of dialysis catheter. Electronically Signed   By: Aletta Edouard M.D.   On: 10/05/2018 14:37   [place] Lt Upper Extrem Ltd Soft Tissue Non Vascular  Result Date: 10/05/2018 CLINICAL DATA:  Fever. Prior av fistula placement in the left upper extremity. EXAM: ULTRASOUND left UPPER EXTREMITY LIMITED TECHNIQUE: Ultrasound examination of the upper extremity soft tissues was performed in the area of clinical concern. COMPARISON:  None FINDINGS: Near the av fistula site there is 3.6 by 1.8 by 1.2 cm complex lesion which may represent phlegmon or early abscess formation. This is more complex fluid than I would expect given that the patient has prior fistula surgery was on 07/21/2018, well over 2 months ago. IMPRESSION: 1. 3.6 by 1.8 by 1.2 cm complex lesion in the deep subcutaneous tissues at the site of concern, likely with some complex fluid in the vicinity of the av fistula, probably reflecting phlegmon or early abscess formation. Electronically Signed   By: Van Clines M.D.   On: 10/05/2018 16:02   Medications: . sodium chloride 10 mL/hr at 10/06/18 1407  . ferric gluconate (FERRLECIT/NULECIT) IV Stopped (10/06/18 1037)   . amLODipine  5 mg Oral QHS  . calcitRIOL  0.25 mcg Oral Q T,Th,Sa-HD  . Chlorhexidine Gluconate Cloth  6 each Topical Q0600  . darbepoetin (ARANESP) injection - DIALYSIS  40 mcg Intravenous Q Tue-HD  . feeding supplement (PRO-STAT SUGAR FREE 64)  30 mL Oral BID  . fluconazole  200 mg Oral Daily  . heparin injection  (subcutaneous)  5,000 Units Subcutaneous Q8H  . hydrOXYzine  25 mg Oral Once  . insulin aspart  0-5 Units Subcutaneous QHS  . insulin aspart  0-9 Units Subcutaneous TID WC  . insulin glargine  5 Units Subcutaneous QHS  . multivitamin  1 tablet Oral QHS  . ramelteon  8 mg Oral Once  . sodium chloride flush  3 mL Intravenous Q12H  . vancomycin  500 mg Intravenous Q T,Th,Sa-HD

## 2018-10-08 LAB — RENAL FUNCTION PANEL
Albumin: 2.4 g/dL — ABNORMAL LOW (ref 3.5–5.0)
Anion gap: 17 — ABNORMAL HIGH (ref 5–15)
BUN: 73 mg/dL — ABNORMAL HIGH (ref 8–23)
CO2: 24 mmol/L (ref 22–32)
Calcium: 8.4 mg/dL — ABNORMAL LOW (ref 8.9–10.3)
Chloride: 88 mmol/L — ABNORMAL LOW (ref 98–111)
Creatinine, Ser: 7.59 mg/dL — ABNORMAL HIGH (ref 0.44–1.00)
GFR calc Af Amer: 6 mL/min — ABNORMAL LOW (ref >60)
GFR calc non Af Amer: 5 mL/min — ABNORMAL LOW (ref >60)
Glucose, Bld: 174 mg/dL — ABNORMAL HIGH (ref 70–99)
Phosphorus: 5.5 mg/dL — ABNORMAL HIGH (ref 2.5–4.6)
Potassium: 4.7 mmol/L (ref 3.5–5.1)
Sodium: 129 mmol/L — ABNORMAL LOW (ref 135–145)

## 2018-10-08 LAB — CBC
HCT: 23.1 % — ABNORMAL LOW (ref 36.0–46.0)
Hemoglobin: 7.2 g/dL — ABNORMAL LOW (ref 12.0–15.0)
MCH: 22.3 pg — ABNORMAL LOW (ref 26.0–34.0)
MCHC: 31.2 g/dL (ref 30.0–36.0)
MCV: 71.5 fL — ABNORMAL LOW (ref 80.0–100.0)
Platelets: 357 10*3/uL (ref 150–400)
RBC: 3.23 MIL/uL — AB (ref 3.87–5.11)
RDW: 19.4 % — ABNORMAL HIGH (ref 11.5–15.5)
WBC: 13.2 10*3/uL — ABNORMAL HIGH (ref 4.0–10.5)
nRBC: 0.2 % (ref 0.0–0.2)

## 2018-10-08 LAB — GLUCOSE, CAPILLARY
GLUCOSE-CAPILLARY: 139 mg/dL — AB (ref 70–99)
Glucose-Capillary: 197 mg/dL — ABNORMAL HIGH (ref 70–99)
Glucose-Capillary: 176 mg/dL — ABNORMAL HIGH (ref 70–99)
Glucose-Capillary: 202 mg/dL — ABNORMAL HIGH (ref 70–99)

## 2018-10-08 MED ORDER — ALTEPLASE 2 MG IJ SOLR
2.0000 mg | Freq: Once | INTRAMUSCULAR | Status: DC | PRN
  Filled 2018-10-08: qty 2

## 2018-10-08 MED ORDER — DARBEPOETIN ALFA 60 MCG/0.3ML IJ SOSY
PREFILLED_SYRINGE | INTRAMUSCULAR | Status: AC
  Filled 2018-10-08: qty 0.3

## 2018-10-08 MED ORDER — HEPARIN SODIUM (PORCINE) 1000 UNIT/ML DIALYSIS: 3400.0000 [IU] | INTRAMUSCULAR | Status: DC | PRN

## 2018-10-08 MED ORDER — HEPARIN SODIUM (PORCINE) 1000 UNIT/ML DIALYSIS: 1000.0000 [IU] | INTRAMUSCULAR | Status: DC | PRN

## 2018-10-08 MED ORDER — VANCOMYCIN HCL IN DEXTROSE 500-5 MG/100ML-% IV SOLN
INTRAVENOUS | Status: AC
  Filled 2018-10-08: qty 100

## 2018-10-08 MED ORDER — HEPARIN SODIUM (PORCINE) 1000 UNIT/ML DIALYSIS: 20.0000 [IU]/kg | INTRAMUSCULAR | Status: DC | PRN

## 2018-10-08 MED ORDER — SODIUM CHLORIDE 0.9 % IV SOLN: 100.0000 mL | INTRAVENOUS | Status: DC | PRN

## 2018-10-08 MED ORDER — PENTAFLUOROPROP-TETRAFLUOROETH EX AERO: 1.0000 "application " | INHALATION_SPRAY | CUTANEOUS | Status: DC | PRN

## 2018-10-08 MED ORDER — LIDOCAINE-PRILOCAINE 2.5-2.5 % EX CREA: 1.0000 "application " | TOPICAL_CREAM | CUTANEOUS | Status: DC | PRN

## 2018-10-08 MED ORDER — CALCITRIOL 0.25 MCG PO CAPS
ORAL_CAPSULE | ORAL | Status: AC
  Filled 2018-10-08: qty 1

## 2018-10-08 MED ORDER — LIDOCAINE HCL (PF) 1 % IJ SOLN: 5.0000 mL | INTRAMUSCULAR | Status: DC | PRN

## 2018-10-08 NOTE — Progress Notes (Addendum)
[facility] Progress Note   Dialysis Orders: **She does NOT have an active HD unit, will need to undergo CLIP process again** Prev dialyzed at [facility] (4hr, EDW 59kg, 2K/2.25Ca, TDC, no heparin)  S/p L 2nd stage AVF on 01/20/ 20  DC orders - reduce time to 3.5 hours, use tight heparin   Problem/Plan: 1. Fevers -betterBCno growth so far,admit team work up ,  CXR neg infiltrateHx Fungemia w/possible TV involvement - Per ID in 06/2018, recommended Fluconazole indefinitely .- I and D of left arm seroma with drain 1/27 feels better- wound culture pending no growth - on Vanc IV 2. Uremia/Hyperkalemia:d/t missed HD ,resolved with adequate HD 3. ESRD -HDTTS here but got off schedule and ran Monday instead - plan next HD today (in reality she has no real schedule)  Has some degree of renal function. Will set time at 3.5 hours going forward per discussion with Dr. Moshe Cipro. CLIP pending 4. Anemia of CKDwith Fe deficiency-Hgb8- continue to trend ,Low Tsat, startedFe load.-on ESA started at 40 with no significant response - ^ dose for Wed check labs pre HD 5. Secondary hyperparathyroidism -Ca/P ok, PTH 211 on low dose calcitriol. Follow trends.No binder yet 6. HTN/volume -net UF 1.7 Monday with post wt 58, continue to titrate down 7. Nutrition -Alb low diet liberalized to renal. Protein supplements, vitamins.-past 24hr poor appetite with fever - alb could be low in part due to liver disease. 8. 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  9. Dialysis access- L IJ TDC,. S/p L 2nd stage AVF on 1/20 s/p I and D of seroma 1/27 10. Hep C positive- active infection - per primary 11.Dispo-CLIP pending turned down by [facility] and [facility] due to prior noncompliance. CLIP sending out info to other units.  Dr. Joelyn Oms having conversations with her - see 1/28 note.  Has burned some bridges - hard to place dialysis patient. 12. Chest pain on HD yesterday- resolved -  trop flat - she thinks secondary to stress.  Myriam Jacobson, PA-C [facility] Beeper [phone] 10/08/2018,9:54 AM  LOS: 16 days   Subjective:   Angry this am about not going to dialysis Tuesday (prev went Monday).  Wants to know why she can't do outpatient HD. Awaiting CLIP. Explained for HD now.  [birth year] as well.  Objective Vitals:   10/06/18 2028 10/07/18 0934 10/07/18 1636 10/07/18 2205  BP: 113/67 94/61 100/65 131/80  Pulse: (!) 102 90 87 90  Resp: 18 16 18 18   Temp: 98.9 F (37.2 C) 98.2 F (36.8 C) 98 F (36.7 C) 98 F (36.7 C)  TempSrc:  Oral Oral Oral  SpO2: 93% 98% 97% 99%  Weight: 58 kg     Height:       Physical Exam General: very angry yelling this am walking in room Heart: RRR Lungs: no rales Abdomen: soft Extremities: no LE edema Dialysis Access:  Left upper AVF less swollen - + bruit right IJ [facility]   Additional Objective Labs: Basic Metabolic Panel: Recent Labs  Lab 10/02/18 1200 10/06/18 0716  NA 132* 130*  K 5.0 4.7  CL 101 93*  CO2 22 19*  GLUCOSE 114* 102*  BUN 50* 69*  CREATININE 6.01* 9.68*  CALCIUM 8.3* 8.1*  PHOS 3.3 8.7*   Liver Function Tests: Recent Labs  Lab 10/02/18 1200 10/06/18 0716  ALBUMIN 2.5* 2.1*   No results for input(s): LIPASE, AMYLASE in the last 168 hours. CBC: Recent Labs  Lab 10/02/18 1130 10/03/18 0322 10/04/18 0537 10/05/18 0621 10/06/18 0716  WBC 11.3* 8.8 12.6* 13.1* 13.1*  HGB 7.9* 7.6* 7.7* 8.2* 8.0*  HCT 26.4* 25.0* 24.0* 26.4* 25.5*  MCV 73.3* 72.7* 71.2* 71.5* 71.4*  PLT 233 169 171 225 268   Blood Culture    Component Value Date/Time   SDES WOUND LEFT ARM 10/06/2018 1510   SPECREQUEST NONE 10/06/2018 1510   CULT  10/06/2018 1510    NO GROWTH < 24 HOURS Performed at [facility] [address] [place], [place] [address]    REPTSTATUS PENDING 10/06/2018 1510    Cardiac Enzymes: Recent Labs  Lab 10/05/18 0835 10/06/18 0812 10/06/18 1657   TROPONINI 0.08* 0.05* 0.04*   CBG: Recent Labs  Lab 10/07/18 0734 10/07/18 1114 10/07/18 1637 10/07/18 2024 10/08/18 0734  GLUCAP 225* 215* 210* 190* 197*   Iron Studies: No results for input(s): IRON, TIBC, TRANSFERRIN, FERRITIN in the last 72 hours. Lab Results  Component Value Date   INR 0.96 09/29/2018   INR 0.97 07/23/2018   INR 0.98 07/22/2018   Studies/Results: No results found. Medications: . sodium chloride 10 mL/hr at 10/06/18 1407  . ferric gluconate (FERRLECIT/NULECIT) IV Stopped (10/06/18 1037)   . amLODipine  5 mg Oral QHS  . calcitRIOL  0.25 mcg Oral Q M,W,F-HD  . Chlorhexidine Gluconate Cloth  6 each Topical Q0600  . Chlorhexidine Gluconate Cloth  6 each Topical Q0600  . darbepoetin (ARANESP) injection - DIALYSIS  60 mcg Intravenous Q Wed-HD  . feeding supplement (PRO-STAT SUGAR FREE 64)  30 mL Oral BID  . fluconazole  200 mg Oral Daily  . heparin injection (subcutaneous)  5,000 Units Subcutaneous Q8H  . insulin aspart  0-5 Units Subcutaneous QHS  . insulin aspart  0-9 Units Subcutaneous TID WC  . insulin glargine  5 Units Subcutaneous QHS  . multivitamin  1 tablet Oral QHS  . ramelteon  8 mg Oral Once  . sodium chloride flush  3 mL Intravenous Q12H  . vancomycin  500 mg Intravenous Q M,W,F-HD

## 2018-10-08 NOTE — Progress Notes (Signed)
   Subjective: Ms. Mottram was seen and evaluated at bedside. She reports late yesterday afternoon her LUE drain came out without her realizing it. She seems in better spirits today, but is tired of being in the hospital.  She denies any pain in her left arm or chest pain.   Objective:  Vital signs in last 24 hours: Vitals:   10/07/18 0934 10/07/18 1636 10/07/18 2205 10/08/18 1008  BP: 94/61 100/65 131/80 94/62  Pulse: 90 87 90 85  Resp: 16 18 18 18   Temp: 98.2 F (36.8 C) 98 F (36.7 C) 98 F (36.7 C) 97.8 F (36.6 C)  TempSrc: Oral Oral Oral Oral  SpO2: 98% 97% 99% 99%  Weight:      Height:       General: Sitting up in her chair in no acute distress CV: Normal rate, regular rhythm Ext: LUE fistula without palpable thrill or audible bruit   Assessment/Plan:  Principal Problem:   ESRD (end stage renal disease) (HCC) Active Problems:   Microcytic anemia   Fever   Hypertension   Abdominal pain   Type 2 diabetes mellitus (HCC)   Chronic hepatitis C virus genotype 1b infection (HCC)   Left arm pain   Surgical site infection  Ms. 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 Undergoing CLIP process now.  1.ESRD on Dialysis 2/2 ANCA Vasculitis  Hyperkalemia -Stable - Appreciate nephrology managing inpatient dialysis; undergoing CLIP processfor outpatient dialysis placement. Unfortunately a prolonged process due to history of non-compliance in the past. CSW currently working on pursuing center in [place]  -patient was in better spirits today and is currently agreeable to continuing with inpatient HD while we await to hear from outpatient center   2. Acute chest  pain -ischemic eval was unremarkable - currently resolved; mostly attributes her pain to anxiety   3.Suspected LUE cellulitis: - she has remained afebrile for over 24 hours  -appreciate vascular following; unfortunately drain came out yesterday afternoon which was placed after seroma was evacuated - there is now concern that fistula has thrombosed  - no growth on wound cultures done during washout in OR   - will await further recommendations, but she can likely have access addressed as an outpatient   4.HTN -blood pressures stable on amlodipine.   5.Type 2 DM: - CBGs well controlled on Lantus 5 and SSI  6.Anemia of CKD - hgb has remained stable - iron studies are on low end of normal - loaded with iron and started on ESA per nephrology   7.ActiveHep CInfection: - genotype 1b - follow-up in Hep C clinic outpatient  - denies risk factors; HIV non-reactive  8. 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   Modena Nunnery D, DO 10/08/2018, 11:26 AM [phone]

## 2018-10-08 NOTE — Progress Notes (Signed)
[facility] her drain out last night.   Objective 131/80 90 98 F (36.7 C) (Oral) 18 99%  Intake/Output Summary (Last 24 hours) at 10/08/2018 0824 Last data filed at 10/07/2018 2207 Gross per 24 hour  Intake 240 ml  Output -  Net 240 ml    Palpable left radial pulse, no thrill in fistula  Laboratory Lab Results: Recent Labs    10/06/18 0716  WBC 13.1*  HGB 8.0*  HCT 25.5*  PLT 268   BMET Recent Labs    10/06/18 0716  NA 130*  K 4.7  CL 93*  CO2 19*  GLUCOSE 102*  BUN 69*  CREATININE 9.68*  CALCIUM 8.1*    COAG Lab Results  Component Value Date   INR 0.96 09/29/2018   INR 0.97 07/23/2018   INR 0.98 07/22/2018   No results found for: PTT  Assessment/Planning:  She pulled her drain out last night, concerned fistula is now thrombosed given no appreciable thrill.  No growth on cultures, but fevers have resolved since drainage.  No other usable superficial vein (previous left arm brachiocephalic) and probably would convert to arm graft but not in setting of recent fevers.   Marty Heck 10/08/2018 8:24 AM --

## 2018-10-08 NOTE — Clinical Social Work Note (Addendum)
CSW talked with Joan Flores [phone]) regarding patient and requesting possible placement at a dialysis center in [facility]. Per Maudie Mercury, she works with both centers and will fax a packet that will need to be completed and returned with requested clinicals. CSW will await packet and compile to return to Ms. Atkins.  Kahmya Pinkham Givens, MSW, LCSW Licensed Clinical Social Worker [place] [phone]

## 2018-10-08 NOTE — Progress Notes (Signed)
   10/08/18 0000  Clinical Encounter Type  Visited With Patient;Health care provider  Visit Type Initial;Spiritual support;Psychological support;Social support  Referral From Nurse  Spiritual Encounters  Spiritual Needs Emotional;Grief support  Stress Factors  Patient Stress Factors Loss of control;Major life changes;Lack of knowledge;Health changes;Financial concerns;Exhausted   Met w/ pt in response to [facility] consult.  Life story review, empathetic listening, pt is processing her series of health complications.  Lamenting how this has significantly changed her life and quality of life.  Pt turns 63 on Wed, Jan [birth year], and is very excited about that.  She is grateful that she lived so much longer than her mother.    Pt has complicated feelings around dialysis, narrated concerns about her quality of life and her own goals not being aligned w/ those of her medical team.  Encouraged her to share those concerns w/ her drs, but she feels that concerns will not be/ have not been heard.  Pt repeatedly emphasized in her story telling the importance for her to be independent and have autonomy over her life.  Consulted w/ RN, mentioned whether palliative care might be something to consider for pt.  Myra Gianotti resident, [phone]

## 2018-10-09 DIAGNOSIS — Z515 Encounter for palliative care: Secondary | ICD-10-CM

## 2018-10-09 DIAGNOSIS — B182 Chronic viral hepatitis C: Secondary | ICD-10-CM

## 2018-10-09 LAB — GLUCOSE, CAPILLARY
Glucose-Capillary: 197 mg/dL — ABNORMAL HIGH (ref 70–99)
Glucose-Capillary: 142 mg/dL — ABNORMAL HIGH (ref 70–99)
Glucose-Capillary: 122 mg/dL — ABNORMAL HIGH (ref 70–99)
Glucose-Capillary: 125 mg/dL — ABNORMAL HIGH (ref 70–99)

## 2018-10-09 LAB — RENAL FUNCTION PANEL
Albumin: 2.1 g/dL — ABNORMAL LOW (ref 3.5–5.0)
Anion gap: 9 (ref 5–15)
BUN: 23 mg/dL (ref 8–23)
CO2: 27 mmol/L (ref 22–32)
Calcium: 7.6 mg/dL — ABNORMAL LOW (ref 8.9–10.3)
Chloride: 99 mmol/L (ref 98–111)
Creatinine, Ser: 3.78 mg/dL — ABNORMAL HIGH (ref 0.44–1.00)
GFR, EST AFRICAN AMERICAN: 14 mL/min — AB (ref >60)
GFR, EST NON AFRICAN AMERICAN: 12 mL/min — AB (ref >60)
Glucose, Bld: 109 mg/dL — ABNORMAL HIGH (ref 70–99)
Phosphorus: 3.6 mg/dL (ref 2.5–4.6)
Potassium: 4.5 mmol/L (ref 3.5–5.1)
Sodium: 135 mmol/L (ref 135–145)

## 2018-10-09 LAB — CBC
HCT: 21.5 % — ABNORMAL LOW (ref 36.0–46.0)
Hemoglobin: 6.6 g/dL — CL (ref 12.0–15.0)
MCH: 21.9 pg — ABNORMAL LOW (ref 26.0–34.0)
MCHC: 30.7 g/dL (ref 30.0–36.0)
MCV: 71.2 fL — ABNORMAL LOW (ref 80.0–100.0)
PLATELETS: 257 10*3/uL (ref 150–400)
RBC: 3.02 MIL/uL — ABNORMAL LOW (ref 3.87–5.11)
RDW: 19.9 % — ABNORMAL HIGH (ref 11.5–15.5)
WBC: 11.4 10*3/uL — ABNORMAL HIGH (ref 4.0–10.5)
nRBC: 0.4 % — ABNORMAL HIGH (ref 0.0–0.2)

## 2018-10-09 LAB — HEMOGLOBIN AND HEMATOCRIT, BLOOD
HCT: 26.4 % — ABNORMAL LOW (ref 36.0–46.0)
Hemoglobin: 8.1 g/dL — ABNORMAL LOW (ref 12.0–15.0)

## 2018-10-09 LAB — PREPARE RBC (CROSSMATCH)

## 2018-10-09 MED ORDER — ZOLPIDEM TARTRATE 5 MG PO TABS
5.0000 mg | ORAL_TABLET | Freq: Every evening | ORAL | Status: DC | PRN
  Administered 2018-10-09 – 2018-10-12 (×4): 5 mg via ORAL
  Filled 2018-10-09 (×4): qty 1

## 2018-10-09 MED ORDER — CHLORHEXIDINE GLUCONATE CLOTH 2 % EX PADS
6.0000 | MEDICATED_PAD | Freq: Every day | CUTANEOUS | Status: DC
  Administered 2018-10-11 – 2018-10-13 (×3): 6 via TOPICAL

## 2018-10-09 MED ORDER — SODIUM CHLORIDE 0.9% IV SOLUTION: Freq: Once | INTRAVENOUS | Status: DC

## 2018-10-09 MED ORDER — SODIUM CHLORIDE 0.9 % IV SOLN
125.0000 mg | INTRAVENOUS | Status: DC
  Administered 2018-10-10 – 2018-10-13 (×2): 125 mg via INTRAVENOUS
  Filled 2018-10-09 (×4): qty 10

## 2018-10-09 NOTE — Progress Notes (Signed)
Results for SWARA, DONZE (MRN [id number]) as of 10/09/2018 05:49  Ref. Range 10/07/2018 07:34 10/07/2018 11:14 10/07/2018 16:37 10/07/2018 20:24 10/08/2018 07:34 10/08/2018 10:45 10/08/2018 11:09 10/08/2018 16:26 10/08/2018 23:05 10/09/2018 04:44  Hemoglobin Latest Ref Range: 12.0 - 15.0 g/dL      7.2 (L)    6.6 (LL)  MD on call paged.

## 2018-10-09 NOTE — Consult Note (Addendum)
Consultation Note Date: 10/09/2018   Patient Name: Rhonda Lynch  DOB: [birth year]  MRN: [id number]  Age / Sex: 63 y.o., female  PCP: Clent Demark, PA-C Referring Physician: Lucious Groves, DO  Reason for Consultation: Establishing goals of care and Psychosocial/spiritual support  HPI/Patient Profile: 63 y.o. 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  She required emergent dialysis for a potassium of 7.1.  Palliative medicine was consulted as the patient reports she would like to stop dialysis.  Clinical Assessment and Goals of Care:  I have reviewed medical records including EPIC notes, labs and imaging, received report from the care team, assessed the patient and then spoke with her regarding prognosis, GOC, EOL wishes, disposition and options.  I introduced Palliative Medicine as specialized medical care for people living with serious illness. It focuses on providing relief from the symptoms and stress of a serious illness. The goal is to improve quality of life for both the patient and the family.  We discussed a brief life review of the patient.  Rhonda Lynch is an independent vibrant personality.  She is from [facility] originally.  She has never been married.  She gave birth to 2 sons but reports she only has 1 son now.  Her parents and siblings have passed away.  She was living independently until 6 months ago when she started hemodialysis.  She moved in with her son Rhonda Lynch and his significant other.  While she loves Rhonda Lynch and he is supportive of her Rhonda Lynch says she will never live with him again. She intends to  stay with a friend when she is discharged from the hospital.  As far as functional and nutritional status Rhonda Lynch is walking the hallways and eating regularly.  She appears to be independent with ADLs and she is of clear mind.  We discussed her current illness and what it means in the larger context of her on-going co-morbidities.  Natural disease trajectory and expectations at EOL were discussed.  Rhonda Lynch tells me she was hesitant to start dialysis.  Once she started it it made her feel terrible.  She describes constant debilitating fatigue, nausea every morning and just generally feeling bad.  Further, the procedure itself is painful to Rhonda Lynch.  She feels that dialysis is life support and offers no quality of life.  She exclaims I am going to die whether I take dialysis or not.  She understands that without hemodialysis she will die much more quickly.  Prior to coming into the hospital she had not had dialysis for 2 months.  Rhonda Lynch still makes urine.  Together we estimated that if she stopped dialysis again she would probably have about 2 months left to live.  Rhonda Lynch is very certain that she would rather have 2 months of good  quality time than prolonged life with no quality.  She expresses her faith and describes a close relationship with God.  Rhonda Lynch tells me that she needs to be discharged from the hospital because she has a lot of things to take care of (before she dies).  We talked about dying from end-stage renal disease and discussed outpatient hospice services as [place] prefers to remain independent and not live under anyone else's roof.  I explained to Rhonda Lynch that once she completely stops dialysis she would be eligible for hospice services.  Rhonda Lynch felt strongly that this is something she would want to do but that she needed to speak with her son Rhonda Lynch about it first.  She anticipated Rhonda Lynch would be very much against stopping hemodialysis.  Hospice and Palliative Care services outpatient were explained  and offered.  Questions and concerns were addressed.     Primary Decision Maker:  PATIENT    SUMMARY OF RECOMMENDATIONS    Rhonda Lynch intends to discuss her plans to stop hemodialysis with her son Rhonda Lynch this evening.  Palliative medicine will follow-up with Rhonda Lynch regarding her final decision and initiating hospice services.  [name] plans to continue hemodialysis during the current inpatient stay.  Code Status/Advance Care Planning:  Not discussed   Additional Recommendations (Limitations, Scope, Preferences):  Continue full scope treatment.    Psycho-social/Spiritual:   Desire for further Chaplaincy support: she would appreciate chaplain support  Prognosis:  Weeks to months without hemodialysis.  Unable to determine if she continues HD.    Discharge Planning: potentially home with hospice services pending patient's decision.      Primary Diagnoses: Present on Admission: . ESRD (end stage renal disease) ([place]) . Hypertension . Abdominal pain . Chronic hepatitis C virus genotype 1b infection ([place])   I have reviewed the medical record, interviewed the patient and family, and examined the patient. The following aspects are pertinent.  Past Medical History:  Diagnosis Date  . Diabetes mellitus without complication ([place])   . ESRD (end stage renal disease) on dialysis ([place])    "TTS; [place]; they're moving me to another one" (09/22/2018)  . Hypertension   . Oxygen deficiency    2 L at night  . Stroke ([place])   . Substance abuse ([place])    has been clean for 4 months    Social History   Socioeconomic History  . Marital status: Single    Spouse name: Not on file  . Number of children: Not on file  . Years of education: Not on file  . Highest education level: Not on file  Occupational History  . Not on file  Social Needs  . Financial resource strain: Not on file  . Food insecurity:    Worry: Not on file    Inability: Not on file  . Transportation needs:     Medical: Not on file    Non-medical: Not on file  Tobacco Use  . Smoking status: Current Every Day Smoker    Packs/day: 0.50    Years: 48.00    Pack years: 24.00    Types: Cigarettes  . Smokeless tobacco: Never Used  . Tobacco comment: trying to quit ; smoking 1 1/2 cigarettes/day  (09/22/2018)  Substance and Sexual Activity  . Alcohol use: Not Currently    Comment: 09/22/2018 "stopped about 6 months ago"  . Drug use: Yes    Types: Cocaine, Heroin    Comment: See notes  . Sexual activity: Not Currently    Birth control/protection: None  Lifestyle  . Physical activity:    Days per week: Not on file    Minutes per session: Not on file  . Stress: Not on file  Relationships  . Social connections:    Talks on phone: Not on file    Gets together: Not on file    Attends religious service: Not on file    Active member of club or organization: Not on file    Attends meetings of clubs or organizations: Not on file    Relationship status: Not on file  Other Topics Concern  . Not on file  Social History Narrative   Per notes, Pt lives with her son.   Family History  Problem Relation Age of Onset  . Autoimmune disease Neg Hx    Scheduled Meds: . sodium chloride   Intravenous Once  . amLODipine  5 mg Oral QHS  . calcitRIOL  0.25 mcg Oral Q M,W,F-HD  . Chlorhexidine Gluconate Cloth  6 each Topical Q0600  . darbepoetin (ARANESP) injection - DIALYSIS  60 mcg Intravenous Q Wed-HD  . feeding supplement (PRO-STAT SUGAR FREE 64)  30 mL Oral BID  . fluconazole  200 mg Oral Daily  . heparin injection (subcutaneous)  5,000 Units Subcutaneous Q8H  . insulin aspart  0-5 Units Subcutaneous QHS  . insulin aspart  0-9 Units Subcutaneous TID WC  . insulin glargine  5 Units Subcutaneous QHS  . multivitamin  1 tablet Oral QHS  . ramelteon  8 mg Oral Once  . sodium chloride flush  3 mL Intravenous Q12H   Continuous Infusions: . sodium chloride 10 mL/hr at 10/06/18 1407  . sodium chloride    .  sodium chloride    . [START ON 10/10/2018] ferric gluconate (FERRLECIT/NULECIT) IV     PRN Meds:.sodium chloride, sodium chloride, acetaminophen, camphor-menthol, cyclobenzaprine, diphenhydrAMINE-zinc acetate, nitroGLYCERIN, ondansetron (ZOFRAN) IV, prochlorperazine, simethicone Allergies  Allergen Reactions  . Acetaminophen Nausea And Vomiting    Patient states Acetaminophen and Acetaminophen containing products make her nauseated. She demonstrated this 06/21/18 with nausea followed by emesis.   Review of Systems denies chest pain, SOB, constipation, still makes urine, eating well.  Physical Exam  Well developed petite female, walking the halls, animated personality Alert and orientated, cooperative with clear speech, coherent  Vital Signs: BP 112/71   Pulse 81   Temp 98.1 F (36.7 C) (Oral)   Resp 20   Ht 5\' 1"  (1.549 m)   Wt 62.3 kg   SpO2 100%   BMI 25.95 kg/m  Pain Scale: 0-10 POSS *See Group Information*: 1-Acceptable,Awake and alert Pain Score: 0-No pain   SpO2: SpO2: 100 % O2 Device:SpO2: 100 % O2 Flow Rate: .O2 Flow Rate (L/min): 2 L/min  IO: Intake/output summary:   Intake/Output Summary (Last 24 hours) at 10/09/2018 1756 Last data filed at 10/09/2018 1405 Gross per 24 hour  Intake 1445 ml  Output 1100 ml  Net 345 ml    LBM: Last BM Date: 10/09/18 Baseline Weight: Weight: 61.3 kg Most recent weight: Weight: 62.3 kg     Palliative Assessment/Data: 70%   Flowsheet Rows     Most Recent Value  Intake Tab  Unit at Time of Referral  Med/Surg Unit  Palliative Care Primary Diagnosis  Nephrology  Date Notified  10/08/18  Palliative Care Type  New Palliative care  Reason for referral  Clarify Goals of Care  Date of Admission  09/22/18  # of days IP prior to Palliative referral  16  Clinical Assessment  Psychosocial & Spiritual Assessment  Palliative Care Outcomes      Time In: 3:00 Time Out: 4:10 Time Total: 70 min. Greater than 50%  of this time was  spent counseling and coordinating care related to the above assessment and plan.  Signed by: Florentina Jenny, PA-C Palliative Medicine Pager: [phone]  Please contact Palliative Medicine Team phone at [phone] for questions and concerns.  For individual provider: See Shea Evans

## 2018-10-09 NOTE — Progress Notes (Signed)
Unit RN contacted VAST RN stating pt is supposed to have left arm restricted for nephrology. Discussed with unit RN that a restricted armband was not on patient and patient verbalized that fistula was not placed in left arm.  D/t conflicting information and ensuring patient safety VAST RN contacted nephrology via secure chat. Advised that PIV was placed in left anterior forearm for blood transfusion and asked if PIV could be used for transfusion and then discontinued for purposes of vein preservation and decreased risk for infection. Rhonda Lynch, [place] advised she would check with MD. Rhonda Meeker, PA then replied L arm PIV could be used for transfusion and then dc'd as pt has no other IV fluids or meds at this time.

## 2018-10-09 NOTE — Progress Notes (Signed)
Patient requests for ambien.Text paged MD on call. Rhonda Lynch, Wonda Cheng, Therapist, sports

## 2018-10-09 NOTE — Care Management Important Message (Signed)
Important Message  Patient Details  Name: Rhonda Lynch MRN: [id number] Date of Birth: 03-Jan-1956   Medicare Important Message Given:  Yes    Orbie Pyo 10/09/2018, 3:42 PM

## 2018-10-09 NOTE — Progress Notes (Signed)
   Subjective: Rhonda Lynch was seen and evaluated at bedside. No acute events overnight. Patient was feeling much better today. She tolerated dialysis well last night. She does note some mild lightheadedness this morning, but no palpitations, chest pain, hematuria, melena or hematochezia. Explained that we will be giving her a unit of blood today for her low blood counts.   Objective:  Vital signs in last 24 hours: Vitals:   10/08/18 2230 10/08/18 2240 10/08/18 2303 10/09/18 0458  BP: (!) 89/52 (!) 101/58 101/60 (!) 87/50  Pulse: 72 74 85 78  Resp: 20 18 20 18   Temp:  98.2 F (36.8 C) 97.8 F (36.6 C) 98.1 F (36.7 C)  TempSrc:  Oral Oral Oral  SpO2:  98% 100% 98%  Weight:  62.3 kg    Height:       General: awake, alert, lying in bed in NAD CV: RRR; no murmurs, rubs or gallops Abd: BS+; abdomen is soft, non-tender, non-distended Ext: No edema   Assessment/Plan:  Principal Problem:   ESRD (end stage renal disease) (HCC) Active Problems:   Microcytic anemia   Fever   Hypertension   Abdominal pain   Type 2 diabetes mellitus (HCC)   Chronic hepatitis C virus genotype 1b infection ([facility])   Left arm pain   Surgical site infection  Rhonda Lynch is a 63 yo female with a hx significant for Acute on chronic ESRD secondary to ANCAvasculitis currently on dialysis but non-compliant,HTN, Hx ofICH, and type 2 DM, and polysubstance use disorder,who presentedwith hyperkalemia secondary to missed dialysis over the past 36monthsand hypertensive urgency.Hyperkalemia now resolved. Undergoing CLIP process now.  1.ESRD on Dialysis 2/2 ANCA Vasculitis  Hyperkalemia -Stable - Appreciate nephrology managing inpatient dialysis; undergoing CLIP processfor outpatient dialysis placement. Unfortunately a prolonged process due to history of non-compliance in the past. Waiting on possible placement at [facility] -patient currently amenable to continuing dialysis, but think palliative care  consult would be beneficial to evaluate her [place]  2. Acute chest pain -ischemic eval was unremarkable - currently resolved; mostly attributes her pain to when she becomes anxious  3.Suspected LUE cellulitis: - treated with 5 day course of Vanc; she has remained afebrile over 48 hours since discontinuing antibiotics -appreciate vascular following; unfortunately fistula likely thrombosed - will await further recommendations, but she can likely have access addressed as an outpatient   4.HTN -blood pressures stable on amlodipine.   5.Type 2 DM: - CBGs well controlled on Lantus 5 and SSI  6.Anemia of CKD - hgb trended down to 6.6 - patient mildly symptomatic with softer blood pressures - will administer 1 unit of pRBCs; follow-up post-transfusion H&H - ESA increased at last HD session - no signs or symptoms of active bleeding  - FOBT pending   7.ActiveHep CInfection: - genotype 1b - follow-up in Hep C clinic outpatient  - denies risk factors; HIV non-reactive  8. 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   Modena Nunnery D, DO 10/09/2018, 8:10 AM Pager: [phone]

## 2018-10-09 NOTE — Progress Notes (Signed)
[facility] Progress Note   Dialysis Orders: **She does NOT have an active HD unit, will need to undergo CLIP process again** Prev dialyzed at [facility] (4hr, EDW 59kg, 2K/2.25Ca, TDC, no heparin)  S/p L 2nd stage AVF on 01/20/ 20  DC orders - reduce time to 3.5 hours, use tight heparin   Problem/Plan: 1. Fevers -betterBCno growth so far,admit team work up ,  CXR neg infiltrateHx Fungemia w/possible TV involvement - Per ID in 06/2018, recommended Fluconazole indefinitely .- I and D of left arm seroma with drain 1/27 feels better- wound culture pending no growth - on Vanc IV 2. Uremia/Hyperkalemia:d/t missed HD ,resolved with adequate HD 3. ESRD -HDTTS here but got off schedule and running MWF  instead - plan next HD today (in reality she has no real schedule)  Has some degree of renal function. Will set time at 3.5 hours going forward per discussion with Dr. Moshe Cipro. CLIP pending- being considered for [facility] HD unit - no heparin HD 4. Anemia of CKDwith Fe deficiency-Hgb8> 7.2 pre HD Wed and 6.6 today - for one unit today - - continue to trend ,Low Tsat 14% , startedFe load.-on ESA started at 40 with no significant response - ^ d dose to 60 1/29 Denies bleeding anywhere, but will check FOBT 5. Secondary hyperparathyroidism -Ca/P ok, PTH 211 on low dose calcitriol. Follow trends.No binder yet 6. HTN/volume -net UF 1.7 Monday, 1.1 Wed - post wts variable continue to titrate down 7. Nutrition -Alb low diet liberalized to renal. Protein supplements, vitamins.-past 24hr poor appetite with fever - alb could be low in part due to liver disease. 8. 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  9. Dialysis access- L IJ TDC,. S/p L 2nd stage AVF on 1/20 s/p I and D of seroma 1/27- now clotted - needs access in future - convert to [place] when fevers resolved. 10. Hep C positive- active infection - per primary 11.Dispo-CLIP pending turned down by  [facility] and [facility] due to prior noncompliance. Possibly for [facility] 12. Chest pain on HD 1/27 - resolved - trop flat - she thinks secondary to stress.  Myriam Jacobson, PA-C [facility] Beeper [phone] 10/09/2018,9:12 AM  LOS: 17 days   Subjective:   No problems on dialysis yesterday. Better spirits today.  Objective Vitals:   10/08/18 2240 10/08/18 2303 10/09/18 0458 10/09/18 0901  BP: (!) 101/58 101/60 (!) 87/50 (!) 145/120  Pulse: 74 85 78 (!) 150  Resp: 18 20 18    Temp: 98.2 F (36.8 C) 97.8 F (36.6 C) 98.1 F (36.7 C)   TempSrc: Oral Oral Oral   SpO2: 98% 100% 98% 100%  Weight: 62.3 kg     Height:       Physical Exam General: up walking in her room Heart: RRR Lungs: no rales Abdomen: soft Extremities: tr LE edema R more than left Dialysis Access:  Left upper AVF less swollen no bruit right IJ [facility]   Additional Objective Labs: Basic Metabolic Panel: Recent Labs  Lab 10/06/18 0716 10/08/18 1045 10/09/18 0444  NA 130* 129* 135  K 4.7 4.7 4.5  CL 93* 88* 99  CO2 19* 24 27  GLUCOSE 102* 174* 109*  BUN 69* 73* 23  CREATININE 9.68* 7.59* 3.78*  CALCIUM 8.1* 8.4* 7.6*  PHOS 8.7* 5.5* 3.6   Liver Function Tests: Recent Labs  Lab 10/06/18 0716 10/08/18 1045 10/09/18 0444  ALBUMIN 2.1* 2.4* 2.1*   No results for  input(s): LIPASE, AMYLASE in the last 168 hours. CBC: Recent Labs  Lab 10/04/18 0537 10/05/18 0621 10/06/18 0716 10/08/18 1045 10/09/18 0444  WBC 12.6* 13.1* 13.1* 13.2* 11.4*  HGB 7.7* 8.2* 8.0* 7.2* 6.6*  HCT 24.0* 26.4* 25.5* 23.1* 21.5*  MCV 71.2* 71.5* 71.4* 71.5* 71.2*  PLT 171 225 268 357 257   Blood Culture    Component Value Date/Time   SDES WOUND LEFT ARM 10/06/2018 1510   SPECREQUEST NONE 10/06/2018 1510   CULT  10/06/2018 1510    NO GROWTH 3 DAYS NO ANAEROBES ISOLATED; CULTURE IN PROGRESS FOR 5 DAYS Performed at [facility], [place] [address], [place], [place] [address]    REPTSTATUS PENDING  10/06/2018 1510    Cardiac Enzymes: Recent Labs  Lab 10/05/18 0835 10/06/18 0812 10/06/18 1657  TROPONINI 0.08* 0.05* 0.04*   CBG: Recent Labs  Lab 10/08/18 0734 10/08/18 1109 10/08/18 1626 10/08/18 2305 10/09/18 0735  GLUCAP 197* 176* 139* 202* 197*   Iron Studies: No results for input(s): IRON, TIBC, TRANSFERRIN, FERRITIN in the last 72 hours. Lab Results  Component Value Date   INR 0.96 09/29/2018   INR 0.97 07/23/2018   INR 0.98 07/22/2018   Studies/Results: No results found. Medications: . sodium chloride 10 mL/hr at 10/06/18 1407  . sodium chloride    . sodium chloride    . ferric gluconate (FERRLECIT/NULECIT) IV Stopped (10/06/18 1037)   . sodium chloride   Intravenous Once  . amLODipine  5 mg Oral QHS  . calcitRIOL  0.25 mcg Oral Q M,W,F-HD  . Chlorhexidine Gluconate Cloth  6 each Topical Q0600  . Chlorhexidine Gluconate Cloth  6 each Topical Q0600  . darbepoetin (ARANESP) injection - DIALYSIS  60 mcg Intravenous Q Wed-HD  . feeding supplement (PRO-STAT SUGAR FREE 64)  30 mL Oral BID  . fluconazole  200 mg Oral Daily  . heparin injection (subcutaneous)  5,000 Units Subcutaneous Q8H  . insulin aspart  0-5 Units Subcutaneous QHS  . insulin aspart  0-9 Units Subcutaneous TID WC  . insulin glargine  5 Units Subcutaneous QHS  . multivitamin  1 tablet Oral QHS  . ramelteon  8 mg Oral Once  . sodium chloride flush  3 mL Intravenous Q12H  . vancomycin  500 mg Intravenous Q M,W,F-HD

## 2018-10-10 LAB — RENAL FUNCTION PANEL
Albumin: 2 g/dL — ABNORMAL LOW (ref 3.5–5.0)
Anion gap: 10 (ref 5–15)
BUN: 40 mg/dL — ABNORMAL HIGH (ref 8–23)
CO2: 24 mmol/L (ref 22–32)
Calcium: 7.9 mg/dL — ABNORMAL LOW (ref 8.9–10.3)
Chloride: 101 mmol/L (ref 98–111)
Creatinine, Ser: 6.07 mg/dL — ABNORMAL HIGH (ref 0.44–1.00)
GFR calc Af Amer: 8 mL/min — ABNORMAL LOW (ref >60)
GFR calc non Af Amer: 7 mL/min — ABNORMAL LOW (ref >60)
Glucose, Bld: 162 mg/dL — ABNORMAL HIGH (ref 70–99)
Phosphorus: 2.5 mg/dL (ref 2.5–4.6)
Potassium: 4.4 mmol/L (ref 3.5–5.1)
Sodium: 135 mmol/L (ref 135–145)

## 2018-10-10 LAB — GLUCOSE, CAPILLARY
Glucose-Capillary: 149 mg/dL — ABNORMAL HIGH (ref 70–99)
Glucose-Capillary: 133 mg/dL — ABNORMAL HIGH (ref 70–99)
Glucose-Capillary: 109 mg/dL — ABNORMAL HIGH (ref 70–99)

## 2018-10-10 LAB — TYPE AND SCREEN
ABO/RH(D): A POS
ANTIBODY SCREEN: NEGATIVE
Unit division: 0

## 2018-10-10 LAB — CBC
HCT: 25.2 % — ABNORMAL LOW (ref 36.0–46.0)
Hemoglobin: 7.7 g/dL — ABNORMAL LOW (ref 12.0–15.0)
MCH: 23.3 pg — ABNORMAL LOW (ref 26.0–34.0)
MCHC: 30.6 g/dL (ref 30.0–36.0)
MCV: 76.1 fL — ABNORMAL LOW (ref 80.0–100.0)
Platelets: 297 10*3/uL (ref 150–400)
RBC: 3.31 MIL/uL — ABNORMAL LOW (ref 3.87–5.11)
RDW: 20.6 % — ABNORMAL HIGH (ref 11.5–15.5)
WBC: 12.1 10*3/uL — ABNORMAL HIGH (ref 4.0–10.5)
nRBC: 0.4 % — ABNORMAL HIGH (ref 0.0–0.2)

## 2018-10-10 LAB — BPAM RBC
Blood Product Expiration Date: 202002222359
ISSUE DATE / TIME: 202001301119
Unit Type and Rh: 6200

## 2018-10-10 MED ORDER — SODIUM CHLORIDE 0.9 % IV SOLN: 100.0000 mL | INTRAVENOUS | Status: DC | PRN

## 2018-10-10 MED ORDER — LIDOCAINE HCL (PF) 1 % IJ SOLN: 5.0000 mL | INTRAMUSCULAR | Status: DC | PRN

## 2018-10-10 MED ORDER — PENTAFLUOROPROP-TETRAFLUOROETH EX AERO: 1.0000 "application " | INHALATION_SPRAY | CUTANEOUS | Status: DC | PRN

## 2018-10-10 MED ORDER — LIDOCAINE-PRILOCAINE 2.5-2.5 % EX CREA: 1.0000 "application " | TOPICAL_CREAM | CUTANEOUS | Status: DC | PRN

## 2018-10-10 MED ORDER — HEPARIN SODIUM (PORCINE) 1000 UNIT/ML DIALYSIS
1000.0000 [IU] | INTRAMUSCULAR | Status: DC | PRN
  Administered 2018-10-10: 3400 [IU] via INTRAVENOUS_CENTRAL
  Filled 2018-10-10: qty 1

## 2018-10-10 MED ORDER — CALCITRIOL 0.25 MCG PO CAPS
ORAL_CAPSULE | ORAL | Status: AC
  Filled 2018-10-10: qty 1

## 2018-10-10 MED ORDER — ALTEPLASE 2 MG IJ SOLR: 2.0000 mg | Freq: Once | INTRAMUSCULAR | Status: DC | PRN

## 2018-10-10 MED ORDER — HEPARIN SODIUM (PORCINE) 1000 UNIT/ML IJ SOLN
INTRAMUSCULAR | Status: AC
  Administered 2018-10-10: 3400 [IU] via INTRAVENOUS_CENTRAL
  Filled 2018-10-10: qty 4

## 2018-10-10 NOTE — Progress Notes (Signed)
Daily Progress Note   Patient Name: Rhonda Lynch       Date: 10/10/2018 DOB: 12-03-1955  Age: 63 y.o. MRN#: [id number] Attending Physician: Lucious Groves, DO Primary Care Physician: Tawny Asal Admit Date: 09/22/2018  Reason for Consultation/Follow-up: Establishing goals of care  Subjective/GOC:  This AM, checked on patient and she was in dialysis.  This afternoon, patient back from dialysis but sleeping comfortably upon arrival to room. No s/s of distress or discomfort.   Spoke with Dr. Koleen Distance regarding plan of care and her conversation with Ms. Rayson this morning. Ms. Chervenak has not had a chance to talk with her son yet about her wishes regarding dialysis. She is considering continuing outpatient dialysis for now. No needs today per Dr. Koleen Distance.    Length of Stay: 18  Current Medications: Scheduled Meds:  . sodium chloride   Intravenous Once  . amLODipine  5 mg Oral QHS  . calcitRIOL  0.25 mcg Oral Q M,W,F-HD  . Chlorhexidine Gluconate Cloth  6 each Topical Q0600  . darbepoetin (ARANESP) injection - DIALYSIS  60 mcg Intravenous Q Wed-HD  . feeding supplement (PRO-STAT SUGAR FREE 64)  30 mL Oral BID  . fluconazole  200 mg Oral Daily  . heparin injection (subcutaneous)  5,000 Units Subcutaneous Q8H  . insulin aspart  0-5 Units Subcutaneous QHS  . insulin aspart  0-9 Units Subcutaneous TID WC  . insulin glargine  5 Units Subcutaneous QHS  . multivitamin  1 tablet Oral QHS  . ramelteon  8 mg Oral Once  . sodium chloride flush  3 mL Intravenous Q12H    Continuous Infusions: . sodium chloride 10 mL/hr at 10/06/18 1407  . sodium chloride    . sodium chloride    . sodium chloride    . sodium chloride    . ferric gluconate (FERRLECIT/NULECIT) IV       PRN Meds: sodium chloride, sodium chloride, sodium chloride, sodium chloride, acetaminophen, alteplase, camphor-menthol, cyclobenzaprine, diphenhydrAMINE-zinc acetate, heparin, lidocaine (PF), lidocaine-prilocaine, nitroGLYCERIN, ondansetron (ZOFRAN) IV, pentafluoroprop-tetrafluoroeth, prochlorperazine, simethicone, zolpidem  Physical Exam Vitals signs and nursing note reviewed.  Constitutional:      General: She is sleeping.  HENT:     Head: Normocephalic and atraumatic.  Pulmonary:     Effort: No tachypnea, accessory muscle usage or respiratory  distress.            Vital Signs: BP 124/71   Pulse 93   Temp 97.8 F (36.6 C) (Oral)   Resp 18   Ht 5\' 1"  (1.549 m)   Wt 68.1 kg   SpO2 100%   BMI 28.37 kg/m  SpO2: SpO2: 100 % O2 Device: O2 Device: Room Air O2 Flow Rate: O2 Flow Rate (L/min): 2 L/min  Intake/output summary:   Intake/Output Summary (Last 24 hours) at 10/10/2018 1042 Last data filed at 10/10/2018 0515 Gross per 24 hour  Intake 985 ml  Output 0 ml  Net 985 ml   LBM: Last BM Date: 10/09/18 Baseline Weight: Weight: 61.3 kg Most recent weight: Weight: 68.1 kg       Palliative Assessment/Data: PPS 60%   Flowsheet Rows     Most Recent Value  Intake Tab  Unit at Time of Referral  Med/Surg Unit  Palliative Care Primary Diagnosis  Nephrology  Date Notified  10/08/18  Palliative Care Type  New Palliative care  Reason for referral  Clarify Goals of Care  Date of Admission  09/22/18  # of days IP prior to Palliative referral  16  Clinical Assessment  Psychosocial & Spiritual Assessment  Palliative Care Outcomes      Patient Active Problem List   Diagnosis Date Noted  . Palliative care encounter   . Surgical site infection 10/04/2018  . Left arm pain   . Chronic hepatitis C virus genotype 1b infection ([place]) 09/28/2018  . ICH (intracerebral hemorrhage) ([place]) small left caudate due to HTN 07/17/2018  . HCAP (healthcare-associated pneumonia) 06/21/2018   . Substance induced mood disorder ([place]) 05/15/2018  . Agitation   . Hemodialysis catheter infection, initial encounter ([place])   . 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 Pulmonary embolism ([place]) 04/27/2018  . Sepsis ([place]) 04/26/2018  . Dyspnea 04/26/2018  . Chest pain 04/26/2018  . Tachycardia 04/26/2018  . Abdominal pain 04/18/2018  . Type 2 diabetes mellitus ([place]) 04/18/2018  . Right flank pain 04/18/2018  . Acute respiratory failure with hypoxia ([place]) 04/12/2018  . SVT (supraventricular tachycardia) ([place])   . Chronic renal failure   . Diabetes ([place]) 04/02/2018  . Hypertension   . Noncompliance of patient with renal dialysis ([place])   . Acute pulmonary edema (H[facility])   . Elevated troponin 03/19/2018  . Fever   . Pulmonary vasculitis ([place])   . Diffuse pulmonary alveolar hemorrhage   . Hypoxemia   . Pulmonary infiltrate   . 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[place])   . ESRD (end stage renal disease) ([place]) 02/12/2018  . Acute respiratory failure ([place]) 02/12/2018  . Alcohol abuse 02/12/2018  . Microcytic anemia 02/12/2018  . Cocaine abuse ([place]) 02/12/2018  . Epigastric pain 02/12/2018  . Pneumonia 02/12/2018  . TOBACCO ABUSE 03/08/2010  . PNEUMONIA 03/08/2010    Palliative Care Assessment & Plan   Patient Profile: 63 y.o. 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  She required emergent dialysis for a potassium of 7.1.  Palliative medicine was consulted as the patient reports she would like to stop dialysis.  Assessment: ESRD on hemodialysis ANCA Vasculitis LUE cellulitis HTN Type 2 DM Anemia of CKD Active hepatitis C infection H/o fungemia  endocarditis  Recommendations/Plan:  Continue FULL code/FULL scope treatment.   F/u from initial palliative discussion on 10/09/18. Patient has not yet spoken with her son. Patient considering continuing outpatient dialysis for now.   May benefit from outpatient palliative referral.   Code Status: FULL   Code Status Orders  (From admission, onward)         Start     Ordered   09/22/18 1638  Full code  Continuous     09/22/18 1642        Code Status History    Date Active Date Inactive Code Status Order ID Comments User Context   07/17/2018 1202 07/23/2018 1830 Full Code [id number]  Amie [place], MD ED   06/21/2018 0052 06/24/2018 1905 Full Code [id number]  Rise Patience, MD Inpatient   06/07/2018 2312 06/08/2018 1947 Full Code [id number]  Pattricia Boss, MD ED   06/07/2018 2256 06/07/2018 2312 Full Code [id number]  Pattricia Boss, MD ED   05/13/2018 2156 05/15/2018 2223 Full Code [id number]  Carmin Muskrat, MD ED   04/26/2018 1907 05/03/2018 2002 Full Code [id number]  Jani Gravel, MD ED   04/18/2018 0459 04/25/2018 2241 Full Code [id number]  Rise Patience, MD Inpatient   04/12/2018 1259 04/13/2018 1616 Full Code [id number]  Radene Gunning, NP ED   04/02/2018 1634 04/04/2018 1809 Full Code [id number]  Radene Gunning, NP ED   03/19/2018 2222 03/24/2018 2132 Full Code [id number]  Etta Quill, DO ED   02/12/2018 2122 03/15/2018 1634 Full Code [id number]  OpydIlene Qua, MD ED       Prognosis:   Unable to determine  Discharge Planning:  To Be Determined  Care plan was discussed with patient, Dr. Koleen Distance  Thank you for allowing the Palliative Medicine Team to assist in the care of this patient.   Time In: 1440 Time Out: 1500 Total Time 20 Prolonged Time Billed  no      Greater than 50%  of this time was spent counseling and coordinating care related to the above assessment and plan.  Ihor Dow, FNP-C Palliative Medicine Team  Phone: [phone] Fax: [phone]  Please  contact Palliative Medicine Team phone at [phone] for questions and concerns.

## 2018-10-10 NOTE — Procedures (Signed)
I was present at this dialysis session. I have reviewed the session itself and made appropriate changes.   Palliative notes reviewed.  COnsidering stopping HD.  She has voiced this to me prior, but vacillated back and forth on the issue.    SHe is being considered by [facility] at this time.  I suggested we pursue this and see if she has a better outpt experience prior to discontinuing HD. She is agreeable at this time.  2K, 1.5L UF, Tol well.     Filed Weights   10/08/18 2240 10/09/18 2050 10/10/18 0848  Weight: 62.3 kg 63.9 kg 68.1 kg    Recent Labs  Lab 10/09/18 0444  NA 135  K 4.5  CL 99  CO2 27  GLUCOSE 109*  BUN 23  CREATININE 3.78*  CALCIUM 7.6*  PHOS 3.6    Recent Labs  Lab 10/06/18 0716 10/08/18 1045 10/09/18 0444 10/09/18 2208  WBC 13.1* 13.2* 11.4*  --   HGB 8.0* 7.2* 6.6* 8.1*  HCT 25.5* 23.1* 21.5* 26.4*  MCV 71.4* 71.5* 71.2*  --   PLT 268 357 257  --     Scheduled Meds: . sodium chloride   Intravenous Once  . amLODipine  5 mg Oral QHS  . calcitRIOL  0.25 mcg Oral Q M,W,F-HD  . Chlorhexidine Gluconate Cloth  6 each Topical Q0600  . darbepoetin (ARANESP) injection - DIALYSIS  60 mcg Intravenous Q Wed-HD  . feeding supplement (PRO-STAT SUGAR FREE 64)  30 mL Oral BID  . fluconazole  200 mg Oral Daily  . heparin injection (subcutaneous)  5,000 Units Subcutaneous Q8H  . insulin aspart  0-5 Units Subcutaneous QHS  . insulin aspart  0-9 Units Subcutaneous TID WC  . insulin glargine  5 Units Subcutaneous QHS  . multivitamin  1 tablet Oral QHS  . ramelteon  8 mg Oral Once  . sodium chloride flush  3 mL Intravenous Q12H   Continuous Infusions: . sodium chloride 10 mL/hr at 10/06/18 1407  . sodium chloride    . sodium chloride    . sodium chloride    . sodium chloride    . ferric gluconate (FERRLECIT/NULECIT) IV     PRN Meds:.sodium chloride, sodium chloride, sodium chloride, sodium chloride, acetaminophen, alteplase, camphor-menthol, cyclobenzaprine,  diphenhydrAMINE-zinc acetate, heparin, lidocaine (PF), lidocaine-prilocaine, nitroGLYCERIN, ondansetron (ZOFRAN) IV, pentafluoroprop-tetrafluoroeth, prochlorperazine, simethicone, zolpidem   Pearson Grippe  MD 10/10/2018, 9:54 AM

## 2018-10-10 NOTE — Progress Notes (Signed)
[facility]  Subjective  - Seen in dialysis.  Considering palliative care.   Objective 127/72 83 97.8 F (36.6 C) (Oral) 18 100%  Intake/Output Summary (Last 24 hours) at 10/10/2018 1203 Last data filed at 10/10/2018 0515 Gross per 24 hour  Intake 955 ml  Output 0 ml  Net 955 ml    Left brachiocephalic/radiocepahlic no thrill  Laboratory Lab Results: Recent Labs    10/09/18 0444 10/09/18 2208 10/10/18 0951  WBC 11.4*  --  12.1*  HGB 6.6* 8.1* 7.7*  HCT 21.5* 26.4* 25.2*  PLT 257  --  297   BMET Recent Labs    10/09/18 0444 10/10/18 0951  NA 135 135  K 4.5 4.4  CL 99 101  CO2 27 24  GLUCOSE 109* 162*  BUN 23 40*  CREATININE 3.78* 6.07*  CALCIUM 7.6* 7.9*    COAG Lab Results  Component Value Date   INR 0.96 09/29/2018   INR 0.97 07/23/2018   INR 0.98 07/22/2018   No results found for: PTT  Assessment/Planning:  Saw Rhonda Lynch in dialysis this am.  Currently considering stopping dialysis and palliative care engaged.  Discussed if she decides to proceed with dialysis will need permanent access.  Two failed left arm fistulas in 6 months.  Would benefit from central venogram to r/o central stenosis/occlusion as etiology for failure -  and then likely graft given no usable superficial vein remaining.    Marty Heck 10/10/2018 12:03 PM --

## 2018-10-10 NOTE — Progress Notes (Signed)
   Subjective: Rhonda Lynch was seen and evaluated at bedside. No acute events overnight. She is feeling well this morning. She has been considering the option of discontinuing dialysis, but has not made a final decision yet. She denies chest pain or pain in her left arm. Having normal bowel movements.   Objective:  Vital signs in last 24 hours: Vitals:   10/10/18 1100 10/10/18 1130 10/10/18 1200 10/10/18 1230  BP: (!) 159/118 125/75 (!) 146/72 136/68  Pulse: 61 78 77 83  Resp:      Temp:      TempSrc:      SpO2:      Weight:      Height:       General: awake, alert, lying in bed in NAD CV: RRR; no murmurs, rubs or gallops Abd: BS+; abdomen is soft, non-tender, non-distended   Assessment/Plan:  Principal Problem:   ESRD (end stage renal disease) (HCC) Active Problems:   Microcytic anemia   Fever   Hypertension   Abdominal pain   Type 2 diabetes mellitus (HCC)   Chronic hepatitis C virus genotype 1b infection (HCC)   Left arm pain   Surgical site infection   Palliative care encounter  Rhonda Lynch is a 63 yo female with a hx significant for Acute on chronic ESRD secondary to ANCAvasculitis currently on dialysis but non-compliant,HTN, Hx ofICH, and type 2 DM, and polysubstance use disorder,who presentedwith hyperkalemia secondary to missed dialysis over the past 73monthsand hypertensive urgency.Hyperkalemia now resolved. Undergoing CLIP process now.  1.ESRD on Dialysis 2/2 ANCA Vasculitis  Hyperkalemia -Stable - Appreciate nephrology managing inpatient dialysis - we have been waiting on completion of CLIP process - greatly appreciate palliative consult; she has been considering discontinuing dialysis, but would like to continue for now  2. Acute chest pain -ischemic eval was unremarkable - currently resolved;mostly attributes her pain to when she becomes anxious  3.Suspected LUE cellulitis: -treated with 5 day course of Vanc; she has remained  afebrile and no longer has pain in her LUE  4. Dialysis access -appreciate vascular following; unfortunately fistula likely thrombosed - will await her decision if she decides to proceed with dialysis long-term; will likely need graft moving forward given 2 failed fistulas   5.HTN -blood pressures stable on amlodipine.   6.Type 2 DM: - CBGs well controlled on Lantus 5 and SSI  7.Anemia of CKD - hgb improved to 8.1 s/p 1 unit pRBCs - no signs or symptoms of active bleeding  - continue ESA with HD per nephrology   8.ActiveHep CInfection: - genotype 1b - follow-up in Hep C clinic outpatient  - denies risk factors; HIV non-reactive  9. 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   Modena Nunnery D, DO 10/10/2018, 12:51 PM Pager: [phone]

## 2018-10-10 NOTE — Clinical Social Work Note (Signed)
Reviewed nephrology and Palliative notes and patient was thinking about stopping dialysis and is being considered by [facility] for a dialysis chair. CSW will continue to follow and provide SW intervention services as needed.  Kc Sedlak Givens, MSW, LCSW Licensed Clinical Social Worker Pax [phone]

## 2018-10-11 LAB — CBC
HCT: 27.4 % — ABNORMAL LOW (ref 36.0–46.0)
Hemoglobin: 8.4 g/dL — ABNORMAL LOW (ref 12.0–15.0)
MCH: 23.3 pg — ABNORMAL LOW (ref 26.0–34.0)
MCHC: 30.7 g/dL (ref 30.0–36.0)
MCV: 75.9 fL — ABNORMAL LOW (ref 80.0–100.0)
Platelets: 254 10*3/uL (ref 150–400)
RBC: 3.61 MIL/uL — ABNORMAL LOW (ref 3.87–5.11)
RDW: 21 % — ABNORMAL HIGH (ref 11.5–15.5)
WBC: 17.4 10*3/uL — ABNORMAL HIGH (ref 4.0–10.5)
nRBC: 0.3 % — ABNORMAL HIGH (ref 0.0–0.2)

## 2018-10-11 LAB — GLUCOSE, CAPILLARY
Glucose-Capillary: 105 mg/dL — ABNORMAL HIGH (ref 70–99)
Glucose-Capillary: 117 mg/dL — ABNORMAL HIGH (ref 70–99)
Glucose-Capillary: 193 mg/dL — ABNORMAL HIGH (ref 70–99)
Glucose-Capillary: 120 mg/dL — ABNORMAL HIGH (ref 70–99)

## 2018-10-11 MED ORDER — TRAMADOL HCL 50 MG PO TABS
50.0000 mg | ORAL_TABLET | Freq: Four times a day (QID) | ORAL | Status: AC
  Administered 2018-10-11 – 2018-10-12 (×2): 50 mg via ORAL
  Filled 2018-10-11 (×2): qty 1

## 2018-10-11 MED ORDER — HYDROMORPHONE HCL 2 MG PO TABS: 1.0000 mg | ORAL_TABLET | ORAL | Status: DC | PRN

## 2018-10-11 NOTE — Progress Notes (Signed)
[facility] Progress Note   Subjective:  Seen in room - flat affect today. Says dialysis went ok, plan is to continue HD for now. She wants to be discharged - upset that HD placement is taking so long.  Objective Vitals:   10/10/18 2016 10/10/18 2020 10/11/18 0508 10/11/18 0733  BP: 136/67  (!) 152/88 (!) 141/78  Pulse: 93  94 86  Resp: 20  18 19   Temp: 99.7 F (37.6 C)  99.2 F (37.3 C) 98.7 F (37.1 C)  TempSrc: Oral  Oral Oral  SpO2: 98%  100% 97%  Weight:  63.2 kg    Height:       Physical Exam General: Well appearing woman, NAD. Flat affect today. Heart: RRR; no murmru Lungs: CTAB Abdomen: soft Extremities: No LE edema Dialysis Access: [facility] + prior failed accesses  Additional Objective Labs: Basic Metabolic Panel: Recent Labs  Lab 10/08/18 1045 10/09/18 0444 10/10/18 0951  NA 129* 135 135  K 4.7 4.5 4.4  CL 88* 99 101  CO2 24 27 24   GLUCOSE 174* 109* 162*  BUN 73* 23 40*  CREATININE 7.59* 3.78* 6.07*  CALCIUM 8.4* 7.6* 7.9*  PHOS 5.5* 3.6 2.5   Liver Function Tests: Recent Labs  Lab 10/08/18 1045 10/09/18 0444 10/10/18 0951  ALBUMIN 2.4* 2.1* 2.0*   CBC: Recent Labs  Lab 10/06/18 0716 10/08/18 1045 10/09/18 0444 10/09/18 2208 10/10/18 0951 10/11/18 0354  WBC 13.1* 13.2* 11.4*  --  12.1* 17.4*  HGB 8.0* 7.2* 6.6* 8.1* 7.7* 8.4*  HCT 25.5* 23.1* 21.5* 26.4* 25.2* 27.4*  MCV 71.4* 71.5* 71.2*  --  76.1* 75.9*  PLT 268 357 257  --  297 254   Blood Culture    Component Value Date/Time   SDES WOUND LEFT ARM 10/06/2018 1510   SPECREQUEST NONE 10/06/2018 1510   CULT  10/06/2018 1510    NO GROWTH 4 DAYS NO ANAEROBES ISOLATED; CULTURE IN PROGRESS FOR 5 DAYS Performed at [facility], [place] [address] [place], [place] [address]    REPTSTATUS PENDING 10/06/2018 1510    Cardiac Enzymes: Recent Labs  Lab 10/05/18 0835 10/06/18 0812 10/06/18 1657  TROPONINI 0.08* 0.05* 0.04*   CBG: Recent Labs  Lab 10/09/18 2048  10/10/18 0745 10/10/18 1617 10/10/18 2222 10/11/18 0734  GLUCAP 125* 149* 133* 109* 105*   Medications: . sodium chloride 10 mL/hr at 10/06/18 1407  . ferric gluconate (FERRLECIT/NULECIT) IV 125 mg (10/10/18 1130)   . sodium chloride   Intravenous Once  . amLODipine  5 mg Oral QHS  . calcitRIOL  0.25 mcg Oral Q M,W,F-HD  . Chlorhexidine Gluconate Cloth  6 each Topical Q0600  . darbepoetin (ARANESP) injection - DIALYSIS  60 mcg Intravenous Q Wed-HD  . feeding supplement (PRO-STAT SUGAR FREE 64)  30 mL Oral BID  . fluconazole  200 mg Oral Daily  . heparin injection (subcutaneous)  5,000 Units Subcutaneous Q8H  . insulin aspart  0-5 Units Subcutaneous QHS  . insulin aspart  0-9 Units Subcutaneous TID WC  . insulin glargine  5 Units Subcutaneous QHS  . multivitamin  1 tablet Oral QHS  . ramelteon  8 mg Oral Once  . sodium chloride flush  3 mL Intravenous Q12H    Dialysis Orders: **She does NOT have an active HD unit, undergoing CLIP process again** Prev dialyzed at [facility] (4hr, EDW 59kg, 2K/2.25Ca, TDC, no heparin) S/p L 2nd stage AVF on 01/20/ 20 DC orders - reduce time to  3.5 hours, use tight heparin   Problem/Plan: 1. Fevers: BCx and CXR  negative. 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 S/p I&D L arm seroma 1/27. 2. Uremia/Hyperkalemia:d/t missed HD ,resolved with adequate HD 3. ESRD: Following MWF schedule while in patient - awaiting final CLIP spot prior to d/c. 4. Anemia of CKDwith Fe deficiency: Hgb 8.4 - stable. Last transfused 1U PRBCs 1/30. Tsat low - getting course of Venofer and on Aranesp weekly. 5. Secondary hyperparathyroidism: CorrCa/Phos ok. No binder. On VDRA. 6. HTN/volume: BP control improving, volume better. 7. Nutrition: Alb low, continue protein supplements. 8. 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 9. Dialysis access: TDC in use. S/p 2 failed AVF. VVS consulted, plan for venogram  and then likely AVG placement.  10. Hep C: Active infection. 11.Dispo: Pt considered stopping dialysis, now agreeable to continue. CLIP pending turned down by [facility] and [facility] due to prior noncompliance. Being reconsidered for [facility].   Veneta Penton, PA-C 10/11/2018, 10:11 AM  [facility] Pager: [phone]

## 2018-10-11 NOTE — Progress Notes (Signed)
Internal Medicine Attending:   I saw and examined the patient. I reviewed the resident's note and I agree with the resident's findings and plan as documented in the resident's note.  Patient feels well today and denies any new complaints.  She states that she feels well and wants to go home and is frustrated that the CLIP process is taking so long.  She denied any fevers or chills and states that her appetite is good.  Patient was initially admitted to the hospital secondary to hyperkalemia from missed hemodialysis sessions over the last 2 months and hypertensive urgency.  Her hyperkalemia has resolved and her blood pressures have improved.  We will continue with hemodialysis per nephrology for now.  Patient is agreeable to continue with hemodialysis but would like to go home soon.  Explained to the patient that we are awaiting placement in outpatient hemodialysis prior to discharge.    Of note, patient did have an increased white count today up to 17.  Her cultures have been negative to date.  She denies any pain in her left upper extremity and there is no erythema or increased warmth in the left upper extremity or at the [facility] site.  Will repeat CBC in a.m.  No further work-up at this time.

## 2018-10-11 NOTE — Progress Notes (Signed)
Subjective: Rhonda Lynch was seen sitting on the side of her bed eating breakfast this AM. She did well overnight. She states she is felling well today. She has decided to give outpatient dialysis another try, but has become frustrated with how long has been in the hospital and when told it was unclear when she would be discharge stated that maybe she wouldn't do dialysis. She was informed that the process of placing her in the community for dialysis is ongoing and she would need to remain patient for now. She was also informed that we are monitoring her WBC which was elevated today. She denies chest pain or pain in her left arm.  Objective:  Vital signs in last 24 hours: Vitals:   10/10/18 2016 10/10/18 2020 10/11/18 0508 10/11/18 0733  BP: 136/67  (!) 152/88 (!) 141/78  Pulse: 93  94 86  Resp: 20  18 19   Temp: 99.7 F (37.6 C)  99.2 F (37.3 C) 98.7 F (37.1 C)  TempSrc: Oral  Oral Oral  SpO2: 98%  100% 97%  Weight:  63.2 kg    Height:       Physical Exam Constitutional:      General: She is not in acute distress. Cardiovascular:     Rate and Rhythm: Normal rate and regular rhythm.     Heart sounds: Normal heart sounds.  Pulmonary:     Effort: Pulmonary effort is normal.     Breath sounds: Normal breath sounds.  Abdominal:     General: Bowel sounds are normal.     Palpations: Abdomen is soft.  Musculoskeletal:        General: No swelling or deformity.  Skin:    General: Skin is warm and dry.     Comments: LUE fistula site: No erythema, edema, or warmth HD Cath sit: No erythema, edema, or warmth  Neurological:     Mental Status: She is alert.    Assessment/Plan:  Principal Problem:   ESRD (end stage renal disease) ([place]) Active Problems:   Microcytic anemia   Fever   Hypertension   Abdominal pain   Type 2 diabetes mellitus (HCC)   Chronic hepatitis C virus genotype 1b infection (HCC)   Left arm pain   Palliative care encounter  Rhonda Lynch is a 63 yo female  with a hx significant for Acute on chronic ESRD secondary to ANCAvasculitis currently on dialysis but non-compliant,HTN, Hx ofICH, and type 2 DM, and polysubstance use disorder,who presentedwith hyperkalemia secondary to missed dialysis over the past 69monthsand hypertensive urgency.Hyperkalemia now resolved. Undergoing CLIP process now.  ESRD on Dialysis 2/2 ANCA Vasculitis (Stable) Hyperkalemia (Resolved) - Appreciate nephrology recs and dialysis management - Awaiting completion of CLIP process - Appreciate palliative consult; she would like to continue HD for now  Suspected LUE cellulitis:Treated with 5 days of Vanc; Remained afebrile and no longer has pain in her LUE - WBC up to 17 today - AM CBC  Dialysis access -Appreciate vascular following; unfortunately fistula likely thrombosed - Will see if she decides proceed with dialysis long-term; will likely need graft moving forward given 2 failed fistulas  Chest pain: Resolved, Ischemic eval unremarkable.She mostly attributes her pain to when she becomes anxious  HTN: Stable. - Amlodipine 5mg  Daily  ype 2 OI:ZTIW well controlled - Lantus 5U Daily - SSI  Anemia of CKD: S/P 1 unit pRBCs 1/30. Hgb Stable at 8.4 - No signs or symptoms of active bleeding  - continue ESA with HD per nephrology  -  AM CBC  8.ActiveHep CInfection: Genotype 1b - F/U in Hep C clinic outpatient  - Denies risk factors; HIV non-reactive  9. 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   Neva Seat, MD 10/11/2018, 9:48 AM

## 2018-10-12 LAB — CBC
HCT: 26.5 % — ABNORMAL LOW (ref 36.0–46.0)
Hemoglobin: 8.2 g/dL — ABNORMAL LOW (ref 12.0–15.0)
MCH: 23.6 pg — ABNORMAL LOW (ref 26.0–34.0)
MCHC: 30.9 g/dL (ref 30.0–36.0)
MCV: 76.4 fL — ABNORMAL LOW (ref 80.0–100.0)
Platelets: 281 10*3/uL (ref 150–400)
RBC: 3.47 MIL/uL — ABNORMAL LOW (ref 3.87–5.11)
RDW: 22.1 % — ABNORMAL HIGH (ref 11.5–15.5)
WBC: 15.9 10*3/uL — ABNORMAL HIGH (ref 4.0–10.5)
nRBC: 0.1 % (ref 0.0–0.2)

## 2018-10-12 LAB — GLUCOSE, CAPILLARY
Glucose-Capillary: 134 mg/dL — ABNORMAL HIGH (ref 70–99)
Glucose-Capillary: 102 mg/dL — ABNORMAL HIGH (ref 70–99)
Glucose-Capillary: 127 mg/dL — ABNORMAL HIGH (ref 70–99)
Glucose-Capillary: 122 mg/dL — ABNORMAL HIGH (ref 70–99)

## 2018-10-12 LAB — AEROBIC/ANAEROBIC CULTURE W GRAM STAIN (SURGICAL/DEEP WOUND)
Culture: NO GROWTH
Gram Stain: NONE SEEN

## 2018-10-12 LAB — AEROBIC/ANAEROBIC CULTURE (SURGICAL/DEEP WOUND)

## 2018-10-12 MED ORDER — CYCLOBENZAPRINE HCL 5 MG PO TABS
5.0000 mg | ORAL_TABLET | Freq: Four times a day (QID) | ORAL | Status: DC | PRN
  Administered 2018-10-12: 5 mg via ORAL
  Filled 2018-10-12: qty 1

## 2018-10-12 MED ORDER — ACETAMINOPHEN 325 MG PO TABS: 650.0000 mg | ORAL_TABLET | Freq: Four times a day (QID) | ORAL | Status: DC | PRN

## 2018-10-12 MED ORDER — DICLOFENAC SODIUM 1 % TD GEL
2.0000 g | Freq: Four times a day (QID) | TRANSDERMAL | Status: DC
  Administered 2018-10-12 – 2018-10-13 (×3): 2 g via TOPICAL
  Filled 2018-10-12: qty 100

## 2018-10-12 MED ORDER — HYDROMORPHONE HCL 1 MG/ML IJ SOLN: 0.5000 mg | INTRAMUSCULAR | Status: DC | PRN

## 2018-10-12 MED ORDER — TRAMADOL HCL 50 MG PO TABS
25.0000 mg | ORAL_TABLET | Freq: Two times a day (BID) | ORAL | Status: DC | PRN
  Administered 2018-10-12 – 2018-10-13 (×2): 25 mg via ORAL
  Filled 2018-10-12 (×2): qty 1

## 2018-10-12 NOTE — Progress Notes (Signed)
Patient is complaining of pain. Currently no PRN medications. MD notified and made aware. Awaiting orders. Will continue to monitor.

## 2018-10-12 NOTE — Progress Notes (Addendum)
   Subjective: Sleeping comfortably in bed. Continues to endorse her desire to go home. Does endorse new left shoulder/neck pain since last night. Received tramadol last night and was then able to sleep.   Objective:  Vital signs in last 24 hours: Vitals:   10/11/18 2124 10/11/18 2131 10/12/18 0337 10/12/18 0715  BP:  (!) 145/73 132/81 132/79  Pulse:  92 92 87  Resp:  18  19  Temp:  99.5 F (37.5 C) 100.1 F (37.8 C) 98.1 F (36.7 C)  TempSrc:  Oral Oral Oral  SpO2:  96% 97% 98%  Weight: 63.3 kg     Height:       Gen: resting comfortably in bed, NAD, A&Ox3 MSK: TTP over left upper trapezius extending towards the neck with pain radiating down the left arm. No overlying swelling/redness. ROM full. Strength 5/5 Ext: left arm fistula site without drainage, redness, or warmth. Mild swelling noted.   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 Hyperkalemia resolved. Undergoing CLIP process now  1. ESRD on HD, Hyperkalemia: Stable. Hyperkalemia resolved.  - continue inpatient HD as we await CLIP process - tentatively accepted to [facility] but must have AVG prior to DC - appreciate nephro speaking to VVS about AVG  2. Leukocytosis: down trending, unclear source. Previously treated with 5 days of Vanco for suspected LUE cellulitis. Remains afebrile, LUE is without signs of infection.  - AM CBC  3. Anxiety: stable, well controlled. Presents with chest pain in the setting of anxiety.  - prn hydralazine   Left shoulder pain: endorses achy  pain in the left shoulder radiating towards neck and down the arm. Flexeril helps. On exam, she is TTP over upper trapezius muscle.  - voltaren gel - continue flexeril, increased freq from bid to tid today   ADDENDUM: pt complained of worsening left shoulder pain no extending into the arm and flank. Flexeril and voltaren gel provided no relief. On exam, she now has increased swelling of the left arm with diffuse TTP. No redness or warmth. Concern for DVT. Will order STAT ultrasound. Dilaudid prn severe pain  4. HTN: stable. Continue amlodipine 5. DM2: Stable. Lantus 5U qd, SSI 6. Anemia of CKD: stable. S/p 1U pRBCs 1/30. Continue ESA with HD 7. Active Hep C: genotype 1b, f/u outpt hep C clinic 8. 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

## 2018-10-12 NOTE — Progress Notes (Signed)
Patient continues to complain of pain despite flexeril and Voltaren gel. MD notified. Awaiting orders. Will continue to monitor.

## 2018-10-12 NOTE — Progress Notes (Signed)
[facility] Progress Note   Subjective:  Seen in room. C/o L side/flank pain - on exam seems tender within the muscle. Denies CP/dyspnea.   Objective Vitals:   10/11/18 2124 10/11/18 2131 10/12/18 0337 10/12/18 0715  BP:  (!) 145/73 132/81 132/79  Pulse:  92 92 87  Resp:  18  19  Temp:  99.5 F (37.5 C) 100.1 F (37.8 C) 98.1 F (36.7 C)  TempSrc:  Oral Oral Oral  SpO2:  96% 97% 98%  Weight: 63.3 kg     Height:       Physical Exam General: Well appearing woman, NAD. Flat affect today. Heart: RRR; no murmru Lungs: CTAB Abdomen: soft, mild superficial MSK tenderness in L flank Extremities: No LE edema Dialysis Access: [facility] + prior failed accesses  Additional Objective Labs: Basic Metabolic Panel: Recent Labs  Lab 10/08/18 1045 10/09/18 0444 10/10/18 0951  NA 129* 135 135  K 4.7 4.5 4.4  CL 88* 99 101  CO2 24 27 24   GLUCOSE 174* 109* 162*  BUN 73* 23 40*  CREATININE 7.59* 3.78* 6.07*  CALCIUM 8.4* 7.6* 7.9*  PHOS 5.5* 3.6 2.5   Liver Function Tests: Recent Labs  Lab 10/08/18 1045 10/09/18 0444 10/10/18 0951  ALBUMIN 2.4* 2.1* 2.0*   CBC: Recent Labs  Lab 10/08/18 1045 10/09/18 0444  10/10/18 0951 10/11/18 0354 10/12/18 0603  WBC 13.2* 11.4*  --  12.1* 17.4* 15.9*  HGB 7.2* 6.6*   < > 7.7* 8.4* 8.2*  HCT 23.1* 21.5*   < > 25.2* 27.4* 26.5*  MCV 71.5* 71.2*  --  76.1* 75.9* 76.4*  PLT 357 257  --  297 254 281   < > = values in this interval not displayed.   Blood Culture    Component Value Date/Time   SDES WOUND LEFT ARM 10/06/2018 1510   SPECREQUEST NONE 10/06/2018 1510   CULT  10/06/2018 1510    NO GROWTH 4 DAYS NO ANAEROBES ISOLATED; CULTURE IN PROGRESS FOR 5 DAYS Performed at [facility] [address] [place], [place] [address]    REPTSTATUS PENDING 10/06/2018 1510    Cardiac Enzymes: Recent Labs  Lab 10/06/18 0812 10/06/18 1657  TROPONINI 0.05* 0.04*   CBG: Recent Labs  Lab 10/11/18 0734  10/11/18 1109 10/11/18 1612 10/11/18 2132 10/12/18 0716  GLUCAP 105* 117* 193* 120* 134*   Medications: . sodium chloride 10 mL/hr at 10/06/18 1407  . ferric gluconate (FERRLECIT/NULECIT) IV 125 mg (10/10/18 1130)   . sodium chloride   Intravenous Once  . amLODipine  5 mg Oral QHS  . calcitRIOL  0.25 mcg Oral Q M,W,F-HD  . Chlorhexidine Gluconate Cloth  6 each Topical Q0600  . darbepoetin (ARANESP) injection - DIALYSIS  60 mcg Intravenous Q Wed-HD  . feeding supplement (PRO-STAT SUGAR FREE 64)  30 mL Oral BID  . fluconazole  200 mg Oral Daily  . heparin injection (subcutaneous)  5,000 Units Subcutaneous Q8H  . insulin aspart  0-5 Units Subcutaneous QHS  . insulin aspart  0-9 Units Subcutaneous TID WC  . insulin glargine  5 Units Subcutaneous QHS  . multivitamin  1 tablet Oral QHS  . sodium chloride flush  3 mL Intravenous Q12H    Dialysis Orders: **She does NOT have an active HD unit, undergoing CLIP process again** Prev dialyzed at [facility] (4hr, EDW 59kg, 2K/2.25Ca, TDC, no heparin) S/p L 2nd stage AVF on 01/20/ 20 DC orders - reduce time to 3.5 hours, use  tight heparin  Problem/Plan: 1. Fevers: BCx and CXR  negative. 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 S/p I&D L arm seroma 1/27. 2. Uremia/Hyperkalemia:d/t missed HD ,resolved with adequate HD 3. ESRD: Following MWF schedule while in patient - awaiting final CLIP spot prior to d/c. 4. Anemia of CKDwith Fe deficiency: Hgb 8.2 - stable. Last transfused 1U PRBCs 1/30. Tsat low - getting course of Venofer and on Aranesp weekly. 5. Secondary hyperparathyroidism: CorrCa/Phos ok. No binder. On VDRA. 6. HTN/volume: BP control improving, volume better. 7. Nutrition: Alb low, continue protein supplements. 8. 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 9. Dialysis access: TDC in use. S/p 2 failed AVF. VVS consulted, plan for venogram and then likely AVG placement.   10. Hep C: Active infection. 11.Dispo: Pt considered stopping dialysis, now agreeable to continue. CLIP pending turned down by [facility] and [facility] due to prior noncompliance.Being reconsidered for [facility].   Veneta Penton, PA-C 10/12/2018, 9:49 AM  [facility] Pager: [phone]

## 2018-10-13 ENCOUNTER — Inpatient Hospital Stay (HOSPITAL_COMMUNITY): Payer: Medicare Other

## 2018-10-13 DIAGNOSIS — I82409 Acute embolism and thrombosis of unspecified deep veins of unspecified lower extremity: Secondary | ICD-10-CM

## 2018-10-13 DIAGNOSIS — Z79899 Other long term (current) drug therapy: Secondary | ICD-10-CM

## 2018-10-13 DIAGNOSIS — M79609 Pain in unspecified limb: Secondary | ICD-10-CM

## 2018-10-13 DIAGNOSIS — F419 Anxiety disorder, unspecified: Secondary | ICD-10-CM

## 2018-10-13 DIAGNOSIS — M7989 Other specified soft tissue disorders: Secondary | ICD-10-CM

## 2018-10-13 DIAGNOSIS — Z794 Long term (current) use of insulin: Secondary | ICD-10-CM

## 2018-10-13 DIAGNOSIS — Z792 Long term (current) use of antibiotics: Secondary | ICD-10-CM

## 2018-10-13 LAB — RENAL FUNCTION PANEL
Albumin: 2 g/dL — ABNORMAL LOW (ref 3.5–5.0)
Anion gap: 10 (ref 5–15)
BUN: 45 mg/dL — ABNORMAL HIGH (ref 8–23)
CO2: 25 mmol/L (ref 22–32)
Calcium: 8.1 mg/dL — ABNORMAL LOW (ref 8.9–10.3)
Chloride: 100 mmol/L (ref 98–111)
Creatinine, Ser: 7.27 mg/dL — ABNORMAL HIGH (ref 0.44–1.00)
GFR calc Af Amer: 6 mL/min — ABNORMAL LOW (ref >60)
GFR calc non Af Amer: 5 mL/min — ABNORMAL LOW (ref >60)
Glucose, Bld: 79 mg/dL (ref 70–99)
POTASSIUM: 5.2 mmol/L — AB (ref 3.5–5.1)
Phosphorus: 5 mg/dL — ABNORMAL HIGH (ref 2.5–4.6)
Sodium: 135 mmol/L (ref 135–145)

## 2018-10-13 LAB — CBC
HCT: 25.6 % — ABNORMAL LOW (ref 36.0–46.0)
HEMATOCRIT: 25.6 % — AB (ref 36.0–46.0)
HEMOGLOBIN: 8.1 g/dL — AB (ref 12.0–15.0)
Hemoglobin: 7.9 g/dL — ABNORMAL LOW (ref 12.0–15.0)
MCH: 23.2 pg — ABNORMAL LOW (ref 26.0–34.0)
MCH: 24 pg — AB (ref 26.0–34.0)
MCHC: 30.9 g/dL (ref 30.0–36.0)
MCHC: 31.6 g/dL (ref 30.0–36.0)
MCV: 75.3 fL — ABNORMAL LOW (ref 80.0–100.0)
MCV: 75.7 fL — ABNORMAL LOW (ref 80.0–100.0)
Platelets: 274 10*3/uL (ref 150–400)
Platelets: 282 10*3/uL (ref 150–400)
RBC: 3.4 MIL/uL — ABNORMAL LOW (ref 3.87–5.11)
RBC: 3.38 MIL/uL — ABNORMAL LOW (ref 3.87–5.11)
RDW: 21.7 % — AB (ref 11.5–15.5)
RDW: 22.7 % — ABNORMAL HIGH (ref 11.5–15.5)
WBC: 12.1 10*3/uL — ABNORMAL HIGH (ref 4.0–10.5)
WBC: 11 10*3/uL — ABNORMAL HIGH (ref 4.0–10.5)
nRBC: 0 % (ref 0.0–0.2)
nRBC: 0 % (ref 0.0–0.2)

## 2018-10-13 LAB — GLUCOSE, CAPILLARY
Glucose-Capillary: 99 mg/dL (ref 70–99)
Glucose-Capillary: 230 mg/dL — ABNORMAL HIGH (ref 70–99)

## 2018-10-13 MED ORDER — INSULIN ASPART 100 UNIT/ML ~~LOC~~ SOLN: 5.0000 [IU] | Freq: Three times a day (TID) | SUBCUTANEOUS | 0 refills | Status: AC

## 2018-10-13 MED ORDER — CALCITRIOL 0.25 MCG PO CAPS: 0.2500 ug | ORAL_CAPSULE | ORAL | 0 refills | Status: DC

## 2018-10-13 MED ORDER — FLUCONAZOLE 200 MG PO TABS: 200.0000 mg | ORAL_TABLET | Freq: Every day | ORAL | 0 refills | Status: DC

## 2018-10-13 MED ORDER — RENA-VITE PO TABS: 1.0000 | ORAL_TABLET | Freq: Every day | ORAL | 0 refills | Status: DC

## 2018-10-13 MED ORDER — HEPARIN SODIUM (PORCINE) 1000 UNIT/ML IJ SOLN
INTRAMUSCULAR | Status: AC
  Administered 2018-10-13: 1000 [IU]
  Filled 2018-10-13: qty 4

## 2018-10-13 MED ORDER — CALCITRIOL 0.25 MCG PO CAPS
ORAL_CAPSULE | ORAL | Status: AC
  Administered 2018-10-13: 0.25 ug
  Filled 2018-10-13: qty 1

## 2018-10-13 MED ORDER — INSULIN GLARGINE 100 UNIT/ML ~~LOC~~ SOLN: 5.0000 [IU] | Freq: Every day | SUBCUTANEOUS | 0 refills | Status: DC

## 2018-10-13 MED ORDER — AMLODIPINE BESYLATE 5 MG PO TABS: 5.0000 mg | ORAL_TABLET | Freq: Every day | ORAL | 0 refills | Status: DC

## 2018-10-13 NOTE — Progress Notes (Signed)
Patient expresses want to go home. She states she is just tired of being in the hospital and is tired of waiting for establishing for outpatient dialysis as nothing has changed in the last couple of weeks of her being in the hospital. I tried asking her how she is going to get dialysis when she goes home, she stated that HD will contact her and she will manage like she has been. She states there is nothing we can do to that would make her stay. She was able to understand the risks of leaving the hospital with a HD catheter including infection and clotting, and leaving the hospital without a set dialysis center and schedule including pulmonary edema, ACS, arrhythmia, ICH and understood to go to the ED if she is feeling short of breath, febrile, chest pain, headache, vomiting.  She does not have medicines at home; I provided her with 1 month supply.  We discussed that she needs to f/u with her PCP within 1 week; she states she will contact their office tomorrow.   Patient to sign AMA paperwork. She had no further questions. Updated Dr. Beryle Beams.  Alphonzo Grieve, MD IMTS - PGY3

## 2018-10-13 NOTE — Progress Notes (Signed)
Pt in  [place]

## 2018-10-13 NOTE — Progress Notes (Signed)
Medicine attending: I examined this patient today together with resident physician Dr Francesco Runner and I concur with her evaluation and management plan. 63 year old woman with end-stage renal disease felt secondary to ANCA vasculitis.  She has been poorly compliant with dialysis treatment and at time of initial admission on January 13, and missed approximately 2 months of dialysis sessions now presented with hyperkalemia, potassium 7.1.  There is a prior history of polysubstance abuse.  A history of fungemia for which she is on chronic fluconazole. Hepatitis C diagnosed during the current admission.  Not yet treated.  Left upper extremity remains swollen but decreased subjective pain.  Venous Doppler study will be done to rule out thrombosis. She has now failed 2 attempts at placement of an AV fistula and is under evaluation by vascular surgery to place a vascular graft. Ongoing dialysis through a tunneled catheter in the right subclavian position.  Continue current management plan.  Once vascular graft has been placed, she will be an acceptable candidate for a local outpatient dialysis center.  Despite her compliance issues, she still wants to continue with dialysis at this time.  She did have a follow-up visit from the palliative care team on January 31.  They remain available to support her and her family.

## 2018-10-13 NOTE — Progress Notes (Signed)
   Subjective: In HD, resting comfortably. Left arm pain resolved with tramadol last night. Swelling remains. Denies any complaints. Informed her that she was tentatively accepted at an outpt HD center but will need vascular surgery to place AV graft prior to DC.   Objective:  Vital signs in last 24 hours: Vitals:   10/13/18 0755 10/13/18 0815 10/13/18 0845 10/13/18 0915  BP: 125/80 139/84 140/87 128/84  Pulse: 74 79 80 72  Resp: 18 17 16 16   Temp: (!) 97.1 F (36.2 C)     TempSrc: Oral     SpO2: 98%   98%  Weight:      Height:       Gen: resting comfortably in bed, NAD, A&Ox3 MSK: TTP over left upper trapezius extending towards the neck with pain radiating down the left arm. No overlying swelling/redness. ROM full. Strength 5/5 Ext: left arm fistula site without drainage, redness, or warmth. Mild swelling noted.   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 Hyperkalemia resolved. Undergoing CLIP process now  1. ESRD on HD, Hyperkalemia: Stable. Hyperkalemia resolved.  - continue inpatient HD as we await CLIP process - tentatively accepted to [facility] but must have AVG prior to DC - await VVS plans for AVG  2. Leukocytosis: continues down trending, unclear source. Previously treated with 5 days of Vanco for suspected LUE cellulitis. Remains afebrile, LUE is without signs of infection.  - AM CBC  3. Anxiety: stable, well controlled. Presents with chest pain in the setting of anxiety.  - prn hydralazine   Left shoulder pain:  resolved with low dose tramadol. Left arm continue to be swollen.  - f/u vas [place] of left arm to r/o dvt   4. HTN: stable. Continue amlodipine 5. DM2: Stable. Lantus 5U qd, SSI 6. Anemia of CKD: stable. S/p 1U pRBCs 1/30. Continue ESA with HD 7. Active Hep C: genotype 1b, f/u outpt hep C clinic 8. 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

## 2018-10-13 NOTE — Progress Notes (Signed)
Left lower extremity venous duplex has been completed.   Preliminary results in CV Proc.   [name] 10/13/2018 3:21 PM

## 2018-10-13 NOTE — Progress Notes (Signed)
[facility]  Subjective  - No complaints.  Seen in dialysis.   Objective (!) 146/82 91 97.7 F (36.5 C) (Oral) 18 99%  Intake/Output Summary (Last 24 hours) at 10/13/2018 1341 Last data filed at 10/13/2018 1155 Gross per 24 hour  Intake 410 ml  Output 1500 ml  Net -1090 ml    Left arm incisions c/d/i No thrill in previous brachiobasilic fistula Left radial and brachial pulse  Laboratory Lab Results: Recent Labs    10/13/18 0345 10/13/18 0822  WBC 12.1* 11.0*  HGB 7.9* 8.1*  HCT 25.6* 25.6*  PLT 274 282   BMET Recent Labs    10/13/18 0822  NA 135  K 5.2*  CL 100  CO2 25  GLUCOSE 79  BUN 45*  CREATININE 7.27*  CALCIUM 8.1*    COAG Lab Results  Component Value Date   INR 0.96 09/29/2018   INR 0.97 07/23/2018   INR 0.98 07/22/2018   No results found for: PTT  Assessment/Planning:  Discussed tentative plan for left arm venogram and AV graft on Thursday if WBC continues to trend down and afebrile this week.  Patient states she wants to proceed with dialysis after discussing with family.  Marty Heck 10/13/2018 1:41 PM --

## 2018-10-13 NOTE — Progress Notes (Signed)
Subjective:  On hd , no cos   Objective Vital signs in last 24 hours: Vitals:   10/13/18 0755 10/13/18 0815 10/13/18 0845 10/13/18 0915  BP: 125/80 139/84 140/87 128/84  Pulse: 74 79 80 72  Resp: 18 17 16 16   Temp: (!) 97.1 F (36.2 C)     TempSrc: Oral     SpO2: 98%   98%  Weight:      Height:       Weight change: -0.032 kg  Physical Exam: General: alert thin AAF  On HD/NAD /soft spoken  Heart: RRR, no m,r, g Lungs: CTA bilat  Abdomen: BS pos ,soft ,NT, ND Extremities: no pedal edema, LU Arm surg site  healing  Dialysis Access: RIJ perm cath  Patent on HD     Dialysis Orders: **She does NOT have an active HD unit,undergoingCLIP process again** Prev dialyzed at [facility] (4hr, EDW 59kg, 2K/2.25Ca, TDC, no heparin) S/p L 2nd stage AVF on 01/20/ 20 DC orders - reduce time to 3.5 hours, use tight heparin  Problem/Plan: 1. Fevers: BCx and CXR negative. 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 2. Admit Uremia/Hyperkalemia:d/t missed HD ,resolved with adequate HD 3. ESRD: Following MWF schedule while in patient - awaiting final CLIP spot prior to d/c. 4. Anemia of CKDwith Fe deficiency: Hgb 8.2 >7.9 . Last transfused 1U PRBCs 1/30. Tsat low - getting course of Venofer and on Aranesp 60  weekly. 5. Secondary hyperparathyroidism: CorrCa/Phos ok. No binder. On VDRA. 6. HTN/volume: BP control improving, volume better. 7. Nutrition: Alb 2.0 , continue protein supplements. 8. 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 9. Dialysis access: TDC in use. S/p 2 failed AVF. VVS consulted, plan for venogram and then likely AVG placement. 10. Hep C: Active infection. 11.Dispo:Pt considered stopping dialysis, now agreeable to continue. CLIP pending turned down by [facility] and [facility] due to prior noncompliance.Being reconsidered for [facility] "[facility]")   Ernest Haber, PA-C [facility] Beeper [phone] 10/13/2018,11:54 AM  LOS: 21 days   Labs: Basic Metabolic Panel: Recent Labs  Lab 10/09/18 0444 10/10/18 0951 10/13/18 0822  NA 135 135 135  K 4.5 4.4 5.2*  CL 99 101 100  CO2 27 24 25   GLUCOSE 109* 162* 79  BUN 23 40* 45*  CREATININE 3.78* 6.07* 7.27*  CALCIUM 7.6* 7.9* 8.1*  PHOS 3.6 2.5 5.0*   Liver Function Tests: Recent Labs  Lab 10/09/18 0444 10/10/18 0951 10/13/18 0822  ALBUMIN 2.1* 2.0* 2.0*   No results for input(s): LIPASE, AMYLASE in the last 168 hours. No results for input(s): AMMONIA in the last 168 hours. CBC: Recent Labs  Lab 10/10/18 0951 10/11/18 0354 10/12/18 0603 10/13/18 0345 10/13/18 0822  WBC 12.1* 17.4* 15.9* 12.1* 11.0*  HGB 7.7* 8.4* 8.2* 7.9* 8.1*  HCT 25.2* 27.4* 26.5* 25.6* 25.6*  MCV 76.1* 75.9* 76.4* 75.3* 75.7*  PLT 297 254 281 274 282   Cardiac Enzymes: Recent Labs  Lab 10/06/18 1657  TROPONINI 0.04*   CBG: Recent Labs  Lab 10/11/18 2132 10/12/18 0716 10/12/18 1123 10/12/18 1633 10/12/18 2051  GLUCAP 120* 134* 102* 127* 122*    Studies/Results: No results found. Medications: . sodium chloride 10 mL/hr at 10/06/18 1407  . ferric gluconate (FERRLECIT/NULECIT) IV Stopped (10/13/18 1139)   . sodium chloride   Intravenous Once  . amLODipine  5 mg Oral QHS  . calcitRIOL  0.25 mcg Oral Q M,W,F-HD  .  Chlorhexidine Gluconate Cloth  6 each Topical Q0600  . darbepoetin (ARANESP) injection - DIALYSIS  60 mcg Intravenous Q Wed-HD  . diclofenac sodium  2 g Topical QID  . feeding supplement (PRO-STAT SUGAR FREE 64)  30 mL Oral BID  . fluconazole  200 mg Oral Daily  . heparin      . heparin injection (subcutaneous)  5,000 Units Subcutaneous Q8H  . insulin aspart  0-5 Units Subcutaneous QHS  . insulin aspart  0-9 Units Subcutaneous TID WC  . insulin glargine  5 Units Subcutaneous QHS  . multivitamin  1 tablet Oral QHS  . sodium chloride flush  3 mL Intravenous Q12H

## 2018-10-13 NOTE — Progress Notes (Signed)
Patient left AMA.  Dr. Alphonzo Grieve tried to convince patient and also this nurse tried but in vain.  She was discharged with AVS and medications sent to the pharmacy online.  Reviewed the AVS, verbalized understanding. No peripheral IV or cardiac monitoring.  Escorted out of the unit in w/c, took all her belongings with her.

## 2018-10-14 NOTE — Discharge Summary (Signed)
Name: Rhonda Lynch MRN: [id number] DOB: Oct 09, 1955 63 y.o. PCP: Tawny Asal  Date of Admission: 09/22/2018 11:38 AM Date of Discharge: 10/13/2018 Attending Physician: Dr. Beryle Beams    *PATIENT LEFT AMA*  Discharge Diagnosis: 1. 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 Microcytic anemia  4. H/o PE  5. H/o ICH 6. LUE cellulitis   Discharge Medications: Allergies as of 10/13/2018      Reactions   Acetaminophen Nausea And Vomiting   Patient states Acetaminophen and Acetaminophen containing products make her nauseated. She demonstrated this 06/21/18 with nausea followed by emesis.      Medication List    STOP taking these medications   fluticasone 50 MCG/ACT nasal spray Commonly known as:  FLONASE   hydrALAZINE 25 MG tablet Commonly known as:  APRESOLINE   hydrOXYzine 25 MG tablet Commonly known as:  ATARAX/VISTARIL   zolpidem 5 MG tablet Commonly known as:  AMBIEN     TAKE these medications   ACCU-CHEK AVIVA device Use as instructed   accu-chek soft touch lancets Use twice per day.   amLODipine 5 MG tablet Commonly known as:  NORVASC Take 1 tablet (5 mg total) by mouth at bedtime. What changed:    medication strength  how much to take  when to take this   calcitRIOL 0.25 MCG capsule Commonly known as:  ROCALTROL Take 1 capsule (0.25 mcg total) by mouth every Monday, Wednesday, and Friday with hemodialysis. Start taking on:  October 15, 2018 What changed:  when to take this   fluconazole 200 MG tablet Commonly known as:  DIFLUCAN Take 1 tablet (200 mg total) by mouth daily.   glucose blood test strip Commonly known as:  ACCU-CHEK AVIVA Use twice per day.   insulin aspart 100 UNIT/ML injection Commonly known as:  novoLOG Inject 5 Units into the skin 3 (three) times daily with meals.   insulin glargine 100 UNIT/ML injection Commonly known as:  LANTUS Inject 0.05 mLs (5 Units total) into the skin  at bedtime.   INSULIN SYRINGE .3CC/31GX5/16" 31G X 5/16" 0.3 ML Misc Inject 1 each into the skin 4 (four) times daily.   multivitamin Tabs tablet Take 1 tablet by mouth at bedtime.       Disposition and follow-up:   RhondaCarlotta D Lynch was discharged from [facility] in Stable condition.  At the hospital follow up visit please address:  1.  Please address compliance/GOC with HD. Patient left AMA without permanent vascular access for HD - f/u treatment of active Hep C - f/u need for anticoagulation for h/o PE (deferred full dose anticoagulation during admission due to recent [place] in Nov 2019)  2.  Labs / imaging needed at time of follow-up: BMP, CBC  3.  Pending labs/ test needing follow-up: none  Follow-up Appointments: Follow-up Information    Marty Heck, MD In 5 weeks.   Specialty:  Vascular Surgery Why:  Office will call you to arrange your appt (sent) Contact information: [address] [place] [address] [phone]        Clent Demark, PA-C. Schedule an appointment as soon as possible for a visit in 2 day(s).   Specialty:  Physician Assistant Contact information: [address] [place] [address] [phone]           Hospital Course by problem list: Rhonda Lynch [id number] with acute on chronic ESRD 2/2 ANCA vasculitis, HTN, h/o ICH, DM2, polysubstance  use disorder who presented with headache, n/v, and hyperkalemia 2/2 missed HD sessions over 2 months and hypertensive urgency.   1. Hyperkalemia: Given treatment emergency department with calcium gluconate, albuterol insulin she is also started on [place] and nephrology was consulted she underwent hemodialysis yesterday which greatly improved both her symptoms and resolved her hyperkalemia. No cardiac complications  2. ESRD on HD: No permanent access. Left AMA with tunneled HD catheter in place. AV fistula was attempted in LUE but failed. Struggled to find outpt HD center due  to previous poor compliance. Patient considered stopping HD all together but after further [place] discussion with palliative care team, she decided to continue HD. She was finally temporarily accepted somewhere and vascular surgery was planning to place AV graft but patient left AMA before graft could be attempted.   3. Anemia of CKD: required 1U pRBCs on 1/30 for hgb of 6.6. Hemoglobin remained stable after that.  4. Leukocytosis, fever: Suspected LUE cellulitis after AV fistula placement. Ultrasound of LUE showed possible early abscess formation. Treated with 5 days of vancomycin and taken to OR for washout. Fluid cultures did not grow anything. She was afebrile and without leukocytosis by time of discharge.   5. Active Hep C: genotype 1b, will need outpt f/u in [facility]. Patient left AMA before this could be arranged.    6. Headache, h/o ICH Nov '19 and PE Aug '19: presented with headache. CT head negative for recurrent ICH. Per chart review, she was started on coumadin in Aug 2019 for PE but patient unaware of this and has not been taking any anticoagulation. Given dvt ppx with heparin. Deferred full dose anticoagulation due to recent ICH.   Discharge Vitals:   BP 138/72 (BP Location: Right Arm)   Pulse 91   Temp 98 F (36.7 C) (Oral)   Resp 18   Ht 5\' 1"  (1.549 m)   Wt 61.6 kg   SpO2 100%   BMI 25.66 kg/m   Pertinent Labs, Studies, and Procedures:  CBC Latest Ref Rng & Units 10/13/2018 10/13/2018 10/12/2018  WBC 4.0 - 10.5 K/uL 11.0(H) 12.1(H) 15.9(H)  Hemoglobin 12.0 - 15.0 g/dL 8.1(L) 7.9(L) 8.2(L)  Hematocrit 36.0 - 46.0 % 25.6(L) 25.6(L) 26.5(L)  Platelets 150 - 400 K/uL 282 274 281   BMP Latest Ref Rng & Units 10/13/2018 10/10/2018 10/09/2018  Glucose 70 - 99 mg/dL 79 162(H) 109(H)  BUN 8 - 23 mg/dL 45(H) 40(H) 23  Creatinine 0.44 - 1.00 mg/dL 7.27(H) 6.07(H) 3.78(H)  BUN/Creat Ratio 12 - 28 - - -  Sodium 135 - 145 mmol/L 135 135 135  Potassium 3.5 - 5.1 mmol/L 5.2(H) 4.4 4.5    Chloride 98 - 111 mmol/L 100 101 99  CO2 22 - 32 mmol/L 25 24 27   Calcium 8.9 - 10.3 mg/dL 8.1(L) 7.9(L) 7.6(L)     Discharge Instructions: Discharge Instructions    Call MD for:  difficulty breathing, headache or visual disturbances   Complete by:  As directed    Call MD for:  extreme fatigue   Complete by:  As directed    Call MD for:  hives   Complete by:  As directed    Call MD for:  persistant dizziness or light-headedness   Complete by:  As directed    Call MD for:  persistant nausea and vomiting   Complete by:  As directed    Call MD for:  redness, tenderness, or signs of infection (pain, swelling, redness, odor or green/yellow  discharge around incision site)   Complete by:  As directed    Call MD for:  severe uncontrolled pain   Complete by:  As directed    Call MD for:  temperature >100.4   Complete by:  As directed    Diet - low sodium heart healthy   Complete by:  As directed    Discharge instructions   Complete by:  As directed    I have refilled your medicines. Please follow up with your doctor within 1 week to ensure your electrolytes are stable. If you have chest pain, shortness of breath, headache, vomiting, or feel that your heart is beating funny, please go to the emergency room.  Like we talked about, your dialysis catheter puts you at risk for infections. If you aren't able to see your doctor or go to dialysis, please have it evaluated at the ED in no less than 1-2 weeks.   Increase activity slowly   Complete by:  As directed       Signed: Isabelle Course, MD 10/14/2018, 2:03 PM   Pager: [phone]

## 2018-10-15 ENCOUNTER — Encounter (HOSPITAL_COMMUNITY): Payer: Self-pay | Admitting: Vascular Surgery

## 2018-10-15 ENCOUNTER — Other Ambulatory Visit: Payer: Self-pay | Admitting: *Deleted

## 2018-10-15 NOTE — Progress Notes (Signed)
Call to [facility], asked for orders to be put in for tomorrow's surgery. It was then discovered that this pt. had been an inpt. But left AMA. Will follow up with call to pt.

## 2018-10-15 NOTE — Progress Notes (Signed)
Not able to reach pt. to confirm her agreement to have surgery 10/16/2018. Left message with family member to give to pt. When she is available. Call back to [facility], told that pt. Contact was unsuccessful. Orders will be placed in chart now to anticipate that the surgery will go forward as planned.

## 2018-10-15 NOTE — Anesthesia Preprocedure Evaluation (Deleted)
Anesthesia Evaluation    Airway        Dental   Pulmonary Current Smoker,           Cardiovascular hypertension,      Neuro/Psych    GI/Hepatic   Endo/Other  diabetes  Renal/GU      Musculoskeletal   Abdominal   Peds  Hematology   Anesthesia Other Findings   Reproductive/Obstetrics                             Anesthesia Physical Anesthesia Plan  ASA:   Anesthesia Plan:    Post-op Pain Management:    Induction:   PONV Risk Score and Plan:   Airway Management Planned:   Additional Equipment:   Intra-op Plan:   Post-operative Plan:   Informed Consent:   Plan Discussed with:   Anesthesia Plan Comments: (PAT note written 10/15/2018 by Myra Gianotti, PA-C. )        Anesthesia Quick Evaluation

## 2018-10-15 NOTE — Progress Notes (Signed)
Anesthesia Chart Review: SAME DAY WORK-UP   Case:  [id number] Date/Time:  10/16/18 1245   Procedures:      INSERTION OF ARTERIOVENOUS (AV) GORE-TEX GRAFT ARM (Left )     VENOGRAM UPPER EXTREMITY (Left )   Anesthesia type:  Choice   Pre-op diagnosis:  end stage renal disease   Location:  [facility] OR ROOM 12 / [facility]   Surgeon:  Marty Heck, MD      DISCUSSION: Patient is a 63 year old female scheduled for the above procedure. It appears this was initially planned while patient was still admitted, but she signed out [facility] 10/13/18.  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 - She is s/p left brachiocephalic AVF 0/45/40 that failed. She had a tunneled hemodialysis catheter that had to be removed in August due to concerns for infection and had new right IJ placed 04/29/18. She underwent first stage left BVT 07/21/18.  - She was admitted 09/22/18-10/13/18 for N/V, headache, and hyperkalemia in the setting of missed dialysis since 07/22/18. (Prior clinic dismissed her after she failed to show up or accept phone calls.) She was treated for hyperkalemia and started back on hemodialysis. Head CT done due to headaches with history of hemorrhagic CVA, but was negative. [facility] consulted and she underwent second stage BVT on 09/29/18, but required I&D over AVF for fluid collections. Cultures during hospitalization were negative. Hepatitis testing was positive for hepatitis C with plans to refer to hepatitis C clinic after discharge. Transfused for anemia while hospitalized. Staff working of new HD center at the time of discharge.  Patient is a same day work-up with multiple medical issues including ESRD (with dialysis  non-compliance), cocaine abuse, Hepatitis C. She has undergone several HD access procedures recently. She signed out [place] after being frustrated about prolonged hospitalization and waiting for hemodialysis issues to be resolved. She will be evaluated on the day of surgery by her anesthesia team.   PROVIDERS: Clent Demark, PA-C is PCP  - Seen by cardiologist Quay Burow, MD 04/03/18 for SVT s/p adenosine 6 mg x1. SVT was in the setting of missed hemodialysis and recent cocaine use. Echo done, but no further work-up recommended. She was then seen by Ena Dawley, MD on 04/27/18 for atypical chest pain. Mildly elevated troponin, flat trend felt likely representing demand ischemia (in the setting of RLL PNA/PE/candida fungemia) and no ischemic evaluation recommended. TEE did not reveal any clear-cut evidence of fungal endocarditis. Dialysis catheter felt to be source of fungal infection. - Seen by pulmonologist Jennet Maduro, MD June-July 2020.   LABS: She is for ISTAT4 on arrival. Most recent labs include: Lab Results  Component Value Date   WBC 11.0 (H) 10/13/2018   HGB 8.1 (L) 10/13/2018   HCT 25.6 (L) 10/13/2018   PLT 282 10/13/2018   GLUCOSE 79 10/13/2018   ALT 31 09/22/2018   AST 87 (H) 09/22/2018   NA 135 10/13/2018   K 5.2 (H) 10/13/2018   CL 100 10/13/2018   CREATININE 7.27 (H) 10/13/2018   BUN 45 (H) 10/13/2018   CO2 25 10/13/2018   INR 0.96 09/29/2018   HGBA1C 6.1 (H) 07/19/2018    IMAGES: CXR 10/05/18: IMPRESSION: Stable mild pulmonary interstitial prominence without overt edema.  Stable positioning of dialysis catheter.  MR brain 07/28/18: IMPRESSION: 1. Subcentimeter focus of hemorrhage, likely early subacute, within the tail of the left caudate nucleus, in close proximity to the left optic tract. No underlying lesion is identified. This may be a hypertensive hemorrhage in the setting of anticoagulation. 2. Mild findings of chronic small vessel  ischemia.  VQ scan 04/27/18: IMPRESSION: There are 2 segmental level perfusion defects in the left lower lobe which were not present on prior lung scan and are not appreciable on the ventilation study. This study therefore constitutes a high probability of pulmonary embolus based on PIOPED II criteria.   EKG: 10/06/18:  Sinus tachycardia at 100 bpm Left axis deviation Left ventricular hypertrophy ST elevation, consider early repolarization Nonspecific T wave abnormality Abnormal ECG No significant change since last tracing Confirmed by Lauree Chandler [phone]) on 10/06/2018 9:39:24 PM  CV: LUE venous U/S 10/13/18: Summary: Left: No evidence of deep vein thrombosis in the upper extremity. No evidence of superficial vein thrombosis in the upper extremity.   TEE 04/29/18 (for fungemia): Study Conclusions - Left ventricle: Systolic function was mildly to moderately   reduced. The estimated ejection fraction was in the range of 40%   to 45%. Mild diffuse hypokinesis. - Aortic valve: No evidence of vegetation. - Mitral valve: No evidence of vegetation. There was mild   regurgitation. - Left atrium: The atrium was mildly to moderately dilated. No   evidence of thrombus in the atrial cavity or appendage. - Right atrium: The atrium was mildly to moderately dilated. No   evidence of thrombus in the atrial cavity or appendage. - Atrial septum: No defect or patent foramen ovale was identified. - Tricuspid valve: The posterior leaflet of the TV appears   thickened but no obvious mobile density. There is moderate   eccentric TR directed towards the interatrial septum. Suspicious   for possible TV vegetation. - Pulmonic valve: No evidence of vegetation.  Echo (TTE) 04/03/18 (in setting of missed HD, recent cocaine, recent SVT): Study Conclusions - Left ventricle: The cavity size was normal. Systolic function was   normal. The estimated ejection fraction was in the range of 50%   to  55%. Diffuse hypokinesis. - Aortic valve: Transvalvular velocity was within the normal range.   There was no stenosis. There was no regurgitation. - Mitral valve: Transvalvular velocity was within the normal range.   There was no evidence for stenosis. There was mild to moderate   regurgitation. - Left atrium: The atrium was severely dilated. - Right ventricle: The cavity size was normal. Wall thickness was   normal. Systolic function was normal. - Right atrium: The atrium was severely dilated. - Tricuspid valve: There was moderate regurgitation. - Pulmonary arteries: Systolic pressure was mildly increased. PA   peak pressure: 43 mm Hg (S). - Pericardium, extracardiac: A trivial pericardial effusion was   identified.   Past Medical History:  Diagnosis Date  . Diabetes mellitus without complication ([place])   . ESRD (end stage renal disease) on dialysis ([place])    "TTS; [place]; they're moving me to another one" (09/22/2018)  . Hypertension   . Oxygen deficiency    2 L at night  . Stroke ([place])   . Substance abuse ([place])    has been clean for 4 months     Past Surgical History:  Procedure Laterality Date  . AV FISTULA PLACEMENT Left 03/05/2018   Procedure: ARTERIOVENOUS (AV) FISTULA CREATION  LEFT UPPER EXTREMITY;  Surgeon: [name]  F, MD;  Location: [place];  Service: Vascular;  Laterality: Left;  . AV FISTULA PLACEMENT Left 07/21/2018   Procedure: ARTERIOVENOUS (AV) FISTULA CREATION;  Surgeon: Marty Heck, MD;  Location: [place];  Service: Vascular;  Laterality: Left;  . BASCILIC VEIN TRANSPOSITION Left 09/29/2018   Procedure: BASILIC VEIN TRANSPOSITION SECOND STAGE LEFT UPPER ARM;  Surgeon: Marty Heck, MD;  Location: [place];  Service: Vascular;  Laterality: Left;  . HEMATOMA EVACUATION Left 03/06/2018   Procedure: EVACUATION HEMATOMA LEFT ARM;  Surgeon: Angelia Mould, MD;  Location: [place];  Service: Vascular;  Laterality: Left;  . I&D EXTREMITY Left  10/06/2018   Procedure: IRRIGATION AND DEBRIDEMENT ARM;  Surgeon: Marty Heck, MD;  Location: [facility];  Service: Vascular;  Laterality: Left;  . INSERTION OF DIALYSIS CATHETER Left 04/29/2018   Procedure: INSERTION OF DIALYSIS CATHETER LEFT INTERNAL JUGULAR;  Surgeon: Angelia Mould, MD;  Location: [place];  Service: Vascular;  Laterality: Left;  . IR FLUORO GUIDE CV LINE RIGHT  02/18/2018  . IR FLUORO GUIDE CV LINE RIGHT  02/26/2018  . IR REMOVAL TUN CV CATH W/O FL  04/27/2018  . IR US GUIDE VASC ACCESS RIGHT  02/18/2018  . TEE WITHOUT CARDIOVERSION N/A 04/29/2018   Procedure: TRANSESOPHAGEAL ECHOCARDIOGRAM (TEE);  Surgeon: Sueanne Margarita, MD;  Location[facility];  Service: Cardiovascular;  Laterality: N/A;    MEDICATIONS: No current facility-administered medications for this encounter.    Marland Kitchen amLODipine (NORVASC) 5 MG tablet  . Blood Glucose Monitoring Suppl (ACCU-CHEK AVIVA) device  . calcitRIOL (ROCALTROL) 0.25 MCG capsule  . fluconazole (DIFLUCAN) 200 MG tablet  . glucose blood (ACCU-CHEK AVIVA) test strip  . insulin aspart (NOVOLOG) 100 UNIT/ML injection  . insulin glargine (LANTUS) 100 UNIT/ML injection  . Insulin Syringe-Needle U-100 (INSULIN SYRINGE .3CC/31GX5/16") 31G X 5/16" 0.3 ML MISC  . Lancets (ACCU-CHEK SOFT TOUCH) lancets  . multivitamin (RENA-VIT) TABS tablet    Myra Gianotti, PA-C Surgical Short Stay/Anesthesiology [facility] Phone [phone] [facility] Phone [phone] 10/15/2018 4:12 PM

## 2018-10-15 NOTE — Progress Notes (Signed)
Pre-op instructions left for pt according to pre-op checklist. Pt made aware to stop taking vitamins, fish oil and herbal medications. Do not take any NSAIDs ie: Ibuprofen, Advil, Naproxen (Aleve), Motrin, BC and Goody Powder. Pt made aware to take 2 units of Lantus insulin tonight and no insulin on DOS. Pt made aware to check BG every 2 hours prior to arrival to hospital on DOS. Pt made aware to treat a BG < 70 with 4 glucose tabs or glucose gel or 4 ounces of apple or cranberry juice, wait 15 minutes after intervention to recheck BG, if BG remains < 70, call Short Stay unit to speak with a nurse. PA, Anesthesia, asked to review pt history; see note.

## 2018-10-16 ENCOUNTER — Inpatient Hospital Stay (HOSPITAL_COMMUNITY): Admission: RE | Admit: 2018-10-16 | Payer: Medicare Other | Source: Home / Self Care | Admitting: Vascular Surgery

## 2018-10-16 HISTORY — DX: Unspecified viral hepatitis C without hepatic coma: B19.20

## 2018-10-16 SURGERY — INSERTION OF ARTERIOVENOUS (AV) GORE-TEX GRAFT ARM
Anesthesia: Choice | Laterality: Left

## 2018-10-16 NOTE — Progress Notes (Signed)
Patient was scheduled to arrive at 1030.  Unable to reach patient.  Dr. Carlis Abbott aware and OR desk aware.

## 2018-10-23 ENCOUNTER — Other Ambulatory Visit: Payer: Self-pay

## 2018-10-23 ENCOUNTER — Emergency Department (HOSPITAL_COMMUNITY): Payer: Medicare Other

## 2018-10-23 ENCOUNTER — Encounter (HOSPITAL_COMMUNITY): Payer: Self-pay

## 2018-10-23 ENCOUNTER — Inpatient Hospital Stay (HOSPITAL_COMMUNITY)
Admission: EM | Admit: 2018-10-23 | Discharge: 2018-10-29 | DRG: 673 | Disposition: A | Discharge status: Home / Self Care | Payer: Medicare Other | Attending: Internal Medicine | Admitting: Internal Medicine

## 2018-10-23 DIAGNOSIS — A0839 Other viral enteritis: Secondary | ICD-10-CM | POA: Diagnosis present

## 2018-10-23 DIAGNOSIS — Z86711 Personal history of pulmonary embolism: Secondary | ICD-10-CM

## 2018-10-23 DIAGNOSIS — D631 Anemia in chronic kidney disease: Secondary | ICD-10-CM | POA: Diagnosis present

## 2018-10-23 DIAGNOSIS — R252 Cramp and spasm: Secondary | ICD-10-CM | POA: Diagnosis not present

## 2018-10-23 DIAGNOSIS — Z8679 Personal history of other diseases of the circulatory system: Secondary | ICD-10-CM

## 2018-10-23 DIAGNOSIS — B182 Chronic viral hepatitis C: Secondary | ICD-10-CM | POA: Diagnosis present

## 2018-10-23 DIAGNOSIS — Z886 Allergy status to analgesic agent status: Secondary | ICD-10-CM | POA: Diagnosis not present

## 2018-10-23 DIAGNOSIS — J069 Acute upper respiratory infection, unspecified: Secondary | ICD-10-CM

## 2018-10-23 DIAGNOSIS — N2581 Secondary hyperparathyroidism of renal origin: Secondary | ICD-10-CM | POA: Diagnosis present

## 2018-10-23 DIAGNOSIS — F1490 Cocaine use, unspecified, uncomplicated: Secondary | ICD-10-CM | POA: Diagnosis present

## 2018-10-23 DIAGNOSIS — F17210 Nicotine dependence, cigarettes, uncomplicated: Secondary | ICD-10-CM | POA: Diagnosis present

## 2018-10-23 DIAGNOSIS — R1110 Vomiting, unspecified: Secondary | ICD-10-CM | POA: Insufficient documentation

## 2018-10-23 DIAGNOSIS — E11649 Type 2 diabetes mellitus with hypoglycemia without coma: Secondary | ICD-10-CM | POA: Diagnosis present

## 2018-10-23 DIAGNOSIS — K2970 Gastritis, unspecified, without bleeding: Secondary | ICD-10-CM | POA: Diagnosis present

## 2018-10-23 DIAGNOSIS — Z992 Dependence on renal dialysis: Secondary | ICD-10-CM | POA: Diagnosis not present

## 2018-10-23 DIAGNOSIS — N185 Chronic kidney disease, stage 5: Secondary | ICD-10-CM | POA: Diagnosis not present

## 2018-10-23 DIAGNOSIS — E872 Acidosis: Secondary | ICD-10-CM | POA: Diagnosis present

## 2018-10-23 DIAGNOSIS — R112 Nausea with vomiting, unspecified: Secondary | ICD-10-CM

## 2018-10-23 DIAGNOSIS — I120 Hypertensive chronic kidney disease with stage 5 chronic kidney disease or end stage renal disease: Principal | ICD-10-CM

## 2018-10-23 DIAGNOSIS — Z8673 Personal history of transient ischemic attack (TIA), and cerebral infarction without residual deficits: Secondary | ICD-10-CM | POA: Diagnosis not present

## 2018-10-23 DIAGNOSIS — Z9981 Dependence on supplemental oxygen: Secondary | ICD-10-CM

## 2018-10-23 DIAGNOSIS — R918 Other nonspecific abnormal finding of lung field: Secondary | ICD-10-CM | POA: Diagnosis not present

## 2018-10-23 DIAGNOSIS — F17201 Nicotine dependence, unspecified, in remission: Secondary | ICD-10-CM

## 2018-10-23 DIAGNOSIS — Z66 Do not resuscitate: Secondary | ICD-10-CM

## 2018-10-23 DIAGNOSIS — Z888 Allergy status to other drugs, medicaments and biological substances status: Secondary | ICD-10-CM

## 2018-10-23 DIAGNOSIS — N186 End stage renal disease: Secondary | ICD-10-CM | POA: Diagnosis present

## 2018-10-23 DIAGNOSIS — E1122 Type 2 diabetes mellitus with diabetic chronic kidney disease: Secondary | ICD-10-CM

## 2018-10-23 DIAGNOSIS — F1911 Other psychoactive substance abuse, in remission: Secondary | ICD-10-CM | POA: Diagnosis not present

## 2018-10-23 DIAGNOSIS — R0789 Other chest pain: Secondary | ICD-10-CM | POA: Diagnosis not present

## 2018-10-23 DIAGNOSIS — Z9115 Patient's noncompliance with renal dialysis: Secondary | ICD-10-CM | POA: Diagnosis not present

## 2018-10-23 DIAGNOSIS — Z91199 Patient's noncompliance with other medical treatment and regimen due to unspecified reason: Secondary | ICD-10-CM

## 2018-10-23 DIAGNOSIS — I7789 Other specified disorders of arteries and arterioles: Secondary | ICD-10-CM | POA: Diagnosis not present

## 2018-10-23 DIAGNOSIS — E611 Iron deficiency: Secondary | ICD-10-CM | POA: Diagnosis present

## 2018-10-23 DIAGNOSIS — L299 Pruritus, unspecified: Secondary | ICD-10-CM | POA: Diagnosis not present

## 2018-10-23 DIAGNOSIS — R1084 Generalized abdominal pain: Secondary | ICD-10-CM

## 2018-10-23 DIAGNOSIS — E162 Hypoglycemia, unspecified: Secondary | ICD-10-CM | POA: Diagnosis present

## 2018-10-23 DIAGNOSIS — J208 Acute bronchitis due to other specified organisms: Secondary | ICD-10-CM | POA: Diagnosis present

## 2018-10-23 DIAGNOSIS — Z8619 Personal history of other infectious and parasitic diseases: Secondary | ICD-10-CM | POA: Diagnosis not present

## 2018-10-23 DIAGNOSIS — Z794 Long term (current) use of insulin: Secondary | ICD-10-CM

## 2018-10-23 DIAGNOSIS — J181 Lobar pneumonia, unspecified organism: Secondary | ICD-10-CM

## 2018-10-23 DIAGNOSIS — B1920 Unspecified viral hepatitis C without hepatic coma: Secondary | ICD-10-CM

## 2018-10-23 DIAGNOSIS — R188 Other ascites: Secondary | ICD-10-CM | POA: Diagnosis present

## 2018-10-23 DIAGNOSIS — Z79899 Other long term (current) drug therapy: Secondary | ICD-10-CM

## 2018-10-23 DIAGNOSIS — E119 Type 2 diabetes mellitus without complications: Secondary | ICD-10-CM

## 2018-10-23 DIAGNOSIS — Z9119 Patient's noncompliance with other medical treatment and regimen: Secondary | ICD-10-CM

## 2018-10-23 DIAGNOSIS — Z8701 Personal history of pneumonia (recurrent): Secondary | ICD-10-CM

## 2018-10-23 DIAGNOSIS — A084 Viral intestinal infection, unspecified: Secondary | ICD-10-CM | POA: Diagnosis not present

## 2018-10-23 LAB — CBG MONITORING, ED
Glucose-Capillary: 137 mg/dL — ABNORMAL HIGH (ref 70–99)
Glucose-Capillary: 44 mg/dL — CL (ref 70–99)
Glucose-Capillary: 150 mg/dL — ABNORMAL HIGH (ref 70–99)
Glucose-Capillary: 69 mg/dL — ABNORMAL LOW (ref 70–99)
Glucose-Capillary: 63 mg/dL — ABNORMAL LOW (ref 70–99)
Glucose-Capillary: 96 mg/dL (ref 70–99)
Glucose-Capillary: 184 mg/dL — ABNORMAL HIGH (ref 70–99)
Glucose-Capillary: 167 mg/dL — ABNORMAL HIGH (ref 70–99)
Glucose-Capillary: 158 mg/dL — ABNORMAL HIGH (ref 70–99)

## 2018-10-23 LAB — COMPREHENSIVE METABOLIC PANEL
ALT: 20 U/L (ref 0–44)
AST: 30 U/L (ref 15–41)
Albumin: 2.3 g/dL — ABNORMAL LOW (ref 3.5–5.0)
Alkaline Phosphatase: 86 U/L (ref 38–126)
Anion gap: 16 — ABNORMAL HIGH (ref 5–15)
BUN: 87 mg/dL — ABNORMAL HIGH (ref 8–23)
CHLORIDE: 108 mmol/L (ref 98–111)
CO2: 16 mmol/L — ABNORMAL LOW (ref 22–32)
Calcium: 7.9 mg/dL — ABNORMAL LOW (ref 8.9–10.3)
Creatinine, Ser: 12.74 mg/dL — ABNORMAL HIGH (ref 0.44–1.00)
GFR calc Af Amer: 3 mL/min — ABNORMAL LOW (ref >60)
GFR calc non Af Amer: 3 mL/min — ABNORMAL LOW (ref >60)
Glucose, Bld: 55 mg/dL — ABNORMAL LOW (ref 70–99)
POTASSIUM: 4.5 mmol/L (ref 3.5–5.1)
Sodium: 140 mmol/L (ref 135–145)
Total Bilirubin: 0.6 mg/dL (ref 0.3–1.2)
Total Protein: 6.6 g/dL (ref 6.5–8.1)

## 2018-10-23 LAB — RESPIRATORY PANEL BY PCR
Adenovirus: NOT DETECTED
Bordetella pertussis: NOT DETECTED
Chlamydophila pneumoniae: NOT DETECTED
Coronavirus 229E: NOT DETECTED
Coronavirus HKU1: NOT DETECTED
Coronavirus NL63: NOT DETECTED
Coronavirus OC43: NOT DETECTED
Influenza A: NOT DETECTED
Influenza B: NOT DETECTED
METAPNEUMOVIRUS-RVPPCR: NOT DETECTED
Mycoplasma pneumoniae: NOT DETECTED
PARAINFLUENZA VIRUS 4-RVPPCR: NOT DETECTED
Parainfluenza Virus 1: NOT DETECTED
Parainfluenza Virus 2: NOT DETECTED
Parainfluenza Virus 3: NOT DETECTED
Respiratory Syncytial Virus: NOT DETECTED
Rhinovirus / Enterovirus: NOT DETECTED

## 2018-10-23 LAB — TSH: TSH: 4.444 u[IU]/mL (ref 0.350–4.500)

## 2018-10-23 LAB — CBC
HEMATOCRIT: 30.9 % — AB (ref 36.0–46.0)
Hemoglobin: 9.6 g/dL — ABNORMAL LOW (ref 12.0–15.0)
MCH: 23.1 pg — ABNORMAL LOW (ref 26.0–34.0)
MCHC: 31.1 g/dL (ref 30.0–36.0)
MCV: 74.5 fL — ABNORMAL LOW (ref 80.0–100.0)
Platelets: 250 10*3/uL (ref 150–400)
RBC: 4.15 MIL/uL (ref 3.87–5.11)
RDW: 23.2 % — ABNORMAL HIGH (ref 11.5–15.5)
WBC: 10.4 10*3/uL (ref 4.0–10.5)
nRBC: 0 % (ref 0.0–0.2)

## 2018-10-23 LAB — PHOSPHORUS: PHOSPHORUS: 6.3 mg/dL — AB (ref 2.5–4.6)

## 2018-10-23 LAB — MAGNESIUM: Magnesium: 1.5 mg/dL — ABNORMAL LOW (ref 1.7–2.4)

## 2018-10-23 LAB — ETHANOL: Alcohol, Ethyl (B): <10 mg/dL (ref <10)

## 2018-10-23 LAB — INFLUENZA PANEL BY PCR (TYPE A & B)
INFLAPCR: NEGATIVE
INFLBPCR: NEGATIVE

## 2018-10-23 LAB — LIPASE, BLOOD: Lipase: 118 U/L — ABNORMAL HIGH (ref 11–51)

## 2018-10-23 MED ORDER — DIPHENHYDRAMINE-ZINC ACETATE 2-0.1 % EX CREA
TOPICAL_CREAM | Freq: Three times a day (TID) | CUTANEOUS | Status: DC | PRN
  Administered 2018-10-24: 1 via TOPICAL
  Filled 2018-10-23: qty 28

## 2018-10-23 MED ORDER — ALTEPLASE 2 MG IJ SOLR
INTRAMUSCULAR | Status: AC
  Administered 2018-10-23: 2 mg
  Filled 2018-10-23: qty 2

## 2018-10-23 MED ORDER — PROMETHAZINE HCL 25 MG PO TABS: 12.5000 mg | ORAL_TABLET | Freq: Three times a day (TID) | ORAL | Status: DC | PRN

## 2018-10-23 MED ORDER — HYDROXYZINE HCL 25 MG PO TABS
ORAL_TABLET | ORAL | Status: AC
  Administered 2018-10-23: 23:00:00
  Filled 2018-10-23: qty 1

## 2018-10-23 MED ORDER — AMLODIPINE BESYLATE 5 MG PO TABS
5.0000 mg | ORAL_TABLET | Freq: Every day | ORAL | Status: DC
  Administered 2018-10-24 – 2018-10-28 (×6): 5 mg via ORAL
  Filled 2018-10-23 (×6): qty 1

## 2018-10-23 MED ORDER — DEXTROSE 50 % IV SOLN
1.0000 | Freq: Once | INTRAVENOUS | Status: AC
  Administered 2018-10-23: 50 mL via INTRAVENOUS

## 2018-10-23 MED ORDER — ONDANSETRON HCL 4 MG/2ML IJ SOLN
4.0000 mg | Freq: Once | INTRAMUSCULAR | Status: AC
  Administered 2018-10-23: 4 mg via INTRAVENOUS
  Filled 2018-10-23: qty 2

## 2018-10-23 MED ORDER — ALTEPLASE 2 MG IJ SOLR
2.0000 mg | Freq: Once | INTRAMUSCULAR | Status: AC
  Administered 2018-10-23: 2 mg

## 2018-10-23 MED ORDER — RENA-VITE PO TABS
1.0000 | ORAL_TABLET | Freq: Every day | ORAL | Status: DC
  Administered 2018-10-24 – 2018-10-28 (×6): 1 via ORAL
  Filled 2018-10-23 (×7): qty 1

## 2018-10-23 MED ORDER — ZOLPIDEM TARTRATE 5 MG PO TABS
5.0000 mg | ORAL_TABLET | Freq: Every evening | ORAL | Status: DC | PRN
  Administered 2018-10-24 – 2018-10-28 (×5): 5 mg via ORAL
  Filled 2018-10-23 (×5): qty 1

## 2018-10-23 MED ORDER — DEXTROSE 10 % IV SOLN
INTRAVENOUS | Status: DC
  Administered 2018-10-23: 10:00:00 via INTRAVENOUS

## 2018-10-23 MED ORDER — CHLORHEXIDINE GLUCONATE CLOTH 2 % EX PADS
6.0000 | MEDICATED_PAD | Freq: Every day | CUTANEOUS | Status: DC
  Administered 2018-10-24 – 2018-10-25 (×2): 6 via TOPICAL

## 2018-10-23 MED ORDER — DEXTROSE 50 % IV SOLN
INTRAVENOUS | Status: AC
  Administered 2018-10-23: 50 mL via INTRAVENOUS
  Filled 2018-10-23: qty 50

## 2018-10-23 MED ORDER — HYDROMORPHONE HCL 1 MG/ML IJ SOLN: 0.5000 mg | Freq: Four times a day (QID) | INTRAMUSCULAR | Status: DC | PRN

## 2018-10-23 MED ORDER — OXYCODONE HCL 5 MG PO TABS
5.0000 mg | ORAL_TABLET | Freq: Once | ORAL | Status: AC
  Administered 2018-10-23: 5 mg via ORAL

## 2018-10-23 MED ORDER — HYDROMORPHONE HCL 1 MG/ML IJ SOLN
0.5000 mg | Freq: Once | INTRAMUSCULAR | Status: AC
  Administered 2018-10-24: 0.5 mg via INTRAVENOUS
  Filled 2018-10-23: qty 0.5

## 2018-10-23 MED ORDER — IOHEXOL 300 MG/ML  SOLN
100.0000 mL | Freq: Once | INTRAMUSCULAR | Status: AC | PRN
  Administered 2018-10-23: 100 mL via INTRAVENOUS

## 2018-10-23 MED ORDER — MORPHINE SULFATE (PF) 4 MG/ML IV SOLN
4.0000 mg | Freq: Once | INTRAVENOUS | Status: AC
  Administered 2018-10-23: 4 mg via INTRAVENOUS
  Filled 2018-10-23: qty 1

## 2018-10-23 MED ORDER — CALCITRIOL 0.25 MCG PO CAPS
0.2500 ug | ORAL_CAPSULE | ORAL | Status: DC
  Administered 2018-10-24: 0.25 ug via ORAL

## 2018-10-23 MED ORDER — SODIUM CHLORIDE 0.9% FLUSH: 3.0000 mL | Freq: Once | INTRAVENOUS | Status: DC

## 2018-10-23 MED ORDER — OXYCODONE HCL 5 MG PO TABS
ORAL_TABLET | ORAL | Status: AC
  Filled 2018-10-23: qty 1

## 2018-10-23 MED ORDER — CHLORHEXIDINE GLUCONATE CLOTH 2 % EX PADS
6.0000 | MEDICATED_PAD | Freq: Every day | CUTANEOUS | Status: DC
  Administered 2018-10-25 – 2018-10-29 (×3): 6 via TOPICAL

## 2018-10-23 MED ORDER — ONDANSETRON HCL 4 MG/2ML IJ SOLN: 4.0000 mg | Freq: Four times a day (QID) | INTRAMUSCULAR | Status: DC | PRN

## 2018-10-23 MED ORDER — HYDROXYZINE HCL 25 MG PO TABS: 25.0000 mg | ORAL_TABLET | Freq: Three times a day (TID) | ORAL | Status: DC | PRN

## 2018-10-23 MED ORDER — HEPARIN SODIUM (PORCINE) 5000 UNIT/ML IJ SOLN
5000.0000 [IU] | Freq: Three times a day (TID) | INTRAMUSCULAR | Status: DC
  Administered 2018-10-23 – 2018-10-29 (×13): 5000 [IU] via SUBCUTANEOUS
  Filled 2018-10-23 (×13): qty 1

## 2018-10-23 NOTE — ED Notes (Signed)
RN and MD made aware of CBG 44

## 2018-10-23 NOTE — ED Notes (Signed)
RN and MD made aware of CBG 69

## 2018-10-23 NOTE — Consult Note (Addendum)
Renal Service Consult Note [facility]  Rhonda Lynch 10/23/2018 [name] Requesting Physician: Dr Beryle Beams  Reason for Consult:  ESRD pt w abd pain, N/V HPI: The patient is a 63 y.o. 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  Patient stopped going to dialysis and was discharged from [facility] practice.  Last admit there was CLIP process pending but patient "got tired of waiting" and left AMA.  Also has had 2 AVF's fail so far and VVS was considering central venogram and new AVG but she left before that happened.  Her last HD was 10 d ago here in hospital.  She does not have an OP HD unit at this time.  Pt c/o upper resp symptoms, nasla congestion, cough , non prod, no f/c/d, +nausea, vomiting, some abd pain.  Has R chest HD cath.   ROS  denies CP  no joint pain   no HA  no blurry vision  no rash   Past Medical History  Past Medical History:  Diagnosis Date  . Diabetes mellitus without complication ([place])   . ESRD (end stage renal disease) on dialysis ([place])    "TTS; [place]; they're moving me to another one" (09/22/2018)  . Hepatitis C    diagnosed 09/2018  . Hypertension   . Oxygen deficiency    2 L at night  . Stroke ([place])   . Substance abuse ([place])    has been clean for 4 months    Past Surgical History  Past Surgical History:  Procedure Laterality Date  . AV FISTULA PLACEMENT Left 03/05/2018   Procedure: ARTERIOVENOUS (AV) FISTULA CREATION  LEFT UPPER EXTREMITY;  Surgeon: [name], MD;  Location: [place];  Service: Vascular;  Laterality: Left;  . AV FISTULA PLACEMENT Left 07/21/2018   Procedure: ARTERIOVENOUS (AV) FISTULA CREATION;  Surgeon: Marty Heck, MD;  Location: [place];  Service: Vascular;  Laterality: Left;  . BASCILIC VEIN TRANSPOSITION Left 09/29/2018   Procedure: BASILIC VEIN TRANSPOSITION SECOND STAGE LEFT UPPER ARM;   Surgeon: Marty Heck, MD;  Location: [place];  Service: Vascular;  Laterality: Left;  . HEMATOMA EVACUATION Left 03/06/2018   Procedure: EVACUATION HEMATOMA LEFT ARM;  Surgeon: Angelia Mould, MD;  Location: [place];  Service: Vascular;  Laterality: Left;  . I&D EXTREMITY Left 10/06/2018   Procedure: IRRIGATION AND DEBRIDEMENT ARM;  Surgeon: Marty Heck, MD;  Location: [place];  Service: Vascular;  Laterality: Left;  . INSERTION OF DIALYSIS CATHETER Left 04/29/2018   Procedure: INSERTION OF DIALYSIS CATHETER LEFT INTERNAL JUGULAR;  Surgeon: Angelia Mould, MD;  Location: [place];  Service: Vascular;  Laterality: Left;  . IR FLUORO GUIDE CV LINE RIGHT  02/18/2018  . IR FLUORO GUIDE CV LINE RIGHT  02/26/2018  . IR REMOVAL TUN CV CATH W/O FL  04/27/2018  . IR US GUIDE VASC ACCESS RIGHT  02/18/2018  . TEE WITHOUT CARDIOVERSION N/A 04/29/2018   Procedure: TRANSESOPHAGEAL ECHOCARDIOGRAM (TEE);  Surgeon: Sueanne Margarita, MD;  Location: [facility] ENDOSCOPY;  Service: Cardiovascular;  Laterality: N/A;   Family History  Family History  Problem Relation Age of Onset  . Autoimmune disease Neg Hx    Social History  reports that she has been smoking cigarettes. She has a 24.00 pack-year smoking history. She has never used smokeless tobacco. She reports previous alcohol use. She reports current drug  use. Drugs: Cocaine and Heroin. Allergies  Allergies  Allergen Reactions  . Acetaminophen Nausea And Vomiting    Patient states Acetaminophen and Acetaminophen containing products make her nauseated. She demonstrated this 06/21/18 with nausea followed by emesis.   Home medications Prior to Admission medications   Medication Sig Start Date End Date Taking? Authorizing Provider  amLODipine (NORVASC) 5 MG tablet Take 1 tablet (5 mg total) by mouth at bedtime. 10/13/18  Yes Alphonzo Grieve, MD  calcitRIOL (ROCALTROL) 0.25 MCG capsule Take 1 capsule (0.25 mcg total) by mouth every Monday, Wednesday,  and Friday with hemodialysis. 10/15/18  Yes Alphonzo Grieve, MD  insulin aspart (NOVOLOG) 100 UNIT/ML injection Inject 5 Units into the skin 3 (three) times daily with meals. 10/13/18  Yes Alphonzo Grieve, MD  insulin glargine (LANTUS) 100 UNIT/ML injection Inject 0.05 mLs (5 Units total) into the skin at bedtime. 10/13/18  Yes Alphonzo Grieve, MD  multivitamin (RENA-VIT) TABS tablet Take 1 tablet by mouth at bedtime. 10/13/18  Yes Alphonzo Grieve, MD  Blood Glucose Monitoring Suppl (ACCU-CHEK AVIVA) device Use as instructed 06/05/18 06/05/19  Clent Demark, PA-C  glucose blood (ACCU-CHEK AVIVA) test strip Use twice per day. 06/05/18   Clent Demark, PA-C  Insulin Syringe-Needle U-100 (INSULIN SYRINGE .3CC/31GX5/16") 31G X 5/16" 0.3 ML MISC Inject 1 each into the skin 4 (four) times daily. 06/27/18   Clent Demark, PA-C  Lancets (ACCU-CHEK SOFT [facility]) lancets Use twice per day. 06/05/18   Clent Demark, PA-C   Liver Function Tests Recent Labs  Lab 10/23/18 0657  AST 30  ALT 20  ALKPHOS 86  BILITOT 0.6  PROT 6.6  ALBUMIN 2.3*   Recent Labs  Lab 10/23/18 0657  LIPASE 118*   CBC Recent Labs  Lab 10/23/18 0657  WBC 10.4  HGB 9.6*  HCT 30.9*  MCV 74.5*  PLT 858   Basic Metabolic Panel Recent Labs  Lab 10/23/18 0657 10/23/18 0832  NA 140  --   K 4.5  --   CL 108  --   CO2 16*  --   GLUCOSE 55*  --   BUN 87*  --   CREATININE 12.74*  --   CALCIUM 7.9*  --   PHOS  --  6.3*   Iron/TIBC/Ferritin/ %Sat    Component Value Date/Time   IRON 42 09/23/2018 2113   TIBC 291 09/23/2018 2113   FERRITIN 573 (H) 09/23/2018 2113   IRONPCTSAT 14 09/23/2018 2113    Vitals:   10/23/18 1200 10/23/18 1300 10/23/18 1400 10/23/18 1500  BP: (!) 171/105 (!) 147/101 (!) 159/122 (!) 145/104  Pulse: 97 85 99 93  Resp:   16   Temp:      TempSrc:      SpO2: 100% 94% 100% 100%   Exam Gen alert in no distress No rash, cyanosis or gangrene Sclera anicteric, throat clear  No jvd  or bruits Chest no sig rales or wheezing, occ rhonchi, coughing RRR no w/ 2/6 holo syst M, no RG Abd soft ntnd no mass or ascites +bs GU defer MS no joint effusions or deformity Ext  No LE or UE edema, no wounds or ulcers Neuro is alert, Ox 3 , nf R IJ TDC,  LUA avf wounds healed, no bruits    Home meds:  - amlodipine 5 hs  - insulin lantus 5 hs/ insulin novolog 5u tid ac  - MVI/ vit D w HD  Previously dialyzed at [place] (these are old orders  from late 2019)   4hr   59kg  TDC   Hep none  ( LUA AVF failed in Jan 2020); at time of dc was lowered to 3.5h and low dose heparin  Assessment/ Plan 1. Abd pain /N/ V: some of this may be uremia, no HD x 10d 2. Uremia/ ESRD : does not have OP HD arranged. Discharged from [place] due to noncompliance late last year. Plan HD today and tomorrow. Resume CLIP process and consult VVS for new perm access while here 3. Anemia of CKDwith hx Fe deficiency: Hb 9.6, consider esa 4. Secondary hyperparathyroidism: CorrCa/Phos ok 5. HTN/volume: BP's up , needs volume removal w HD 6. Nutrition: Alb 2.3, better than last admit 7. 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 Hep C: Active infection. 9. Hx of ANCA GN and BOOP: summer 2019, treated w/ immunosuppression 2-3 months. No recurrences.   [facility] 10/23/2018, 3:49 PM

## 2018-10-23 NOTE — ED Notes (Signed)
Patient returned from CT scan.

## 2018-10-23 NOTE — ED Notes (Signed)
CBG now 137.

## 2018-10-23 NOTE — Progress Notes (Signed)
Called by nursing re: Abdominal pain.  Not resolved by morphine given in ED.  CT scan of the abdomen reviewed and showed abdominal ascites and possible enteritis.  Will give a one time dose of oxycodone and if not improved, dilaudid X 1 IV.  Will communicate with the resident team as well.   Gilles Chiquito, MD

## 2018-10-23 NOTE — ED Provider Notes (Signed)
[facility] EMERGENCY DEPARTMENT Provider Note   CSN: [id number] Arrival date & time: 10/23/18  9562     History   Chief Complaint Chief Complaint  Patient presents with  . Hypoglycemia    HPI Rhonda Lynch is a 63 y.o. female.  Pt presents to the ED today with n/v and abdominal pain.  She has a hx of ESRD on HD, however, she has been noncompliant with her dialysis.  The pt was admitted to the hospital from 1/13-2/3 on the IMTS.  She signed out AMA on 2/3 and was not set up for an outpatient HD center.  The pt has a tunneled HD catheter in place for treatment.  The pt has not had any dialysis since d/c from the hospital.  She has had some vomiting.  BS 47 by EMS.  Pt denies f/c.        Past Medical History:  Diagnosis Date  . Diabetes mellitus without complication ([place])   . ESRD (end stage renal disease) on dialysis ([place])    "TTS; [place]; they're moving me to another one" (09/22/2018)  . Hepatitis C    diagnosed 09/2018  . Hypertension   . Oxygen deficiency    2 L at night  . Stroke ([place])   . Substance abuse ([place])    has been clean for 4 months     Patient Active Problem List   Diagnosis Date Noted  . Hypoglycemia 10/23/2018  . Palliative care encounter   . Left arm pain   . Chronic hepatitis C virus genotype 1b infection ([place]) 09/28/2018  . ICH (intracerebral hemorrhage) ([place]) small left caudate due to HTN 07/17/2018  . HCAP (healthcare-associated pneumonia) 06/21/2018  . Substance induced mood disorder ([place]) 05/15/2018  . Agitation   . Hemodialysis catheter infection, initial encounter ([place])   . 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 Pulmonary embolism ([place]) 04/27/2018  . Sepsis ([place]) 04/26/2018  . Dyspnea 04/26/2018  . Chest pain 04/26/2018  . Tachycardia 04/26/2018  . Abdominal pain 04/18/2018  . Type 2 diabetes mellitus ([place]) 04/18/2018  . Right flank pain 04/18/2018  . Acute respiratory failure with hypoxia ([place]) 04/12/2018  . SVT  (supraventricular tachycardia) ([place])   . Chronic renal failure   . Diabetes ([place]) 04/02/2018  . Hypertension   . Noncompliance of patient with renal dialysis ([place])   . Acute pulmonary edema ([facility])   . Elevated troponin 03/19/2018  . Fever   . 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 Diffuse pulmonary alveolar hemorrhage   . Hypoxemia   . Pulmonary infiltrate   . 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[place])   . ESRD (end stage renal disease) ([place]) 02/12/2018  . Acute respiratory failure ([place]) 02/12/2018  . Alcohol abuse 02/12/2018  . Microcytic anemia 02/12/2018  . Cocaine abuse ([place]) 02/12/2018  . Epigastric pain 02/12/2018  . Pneumonia 02/12/2018  . TOBACCO ABUSE 03/08/2010  . PNEUMONIA 03/08/2010    Past Surgical History:  Procedure Laterality Date  . AV FISTULA PLACEMENT Left 03/05/2018   Procedure: ARTERIOVENOUS (AV) FISTULA CREATION  LEFT UPPER EXTREMITY;  Surgeon: [name], MD;  Location: [place];  Service: Vascular;  Laterality: Left;  . AV FISTULA PLACEMENT Left 07/21/2018   Procedure: ARTERIOVENOUS (AV) FISTULA CREATION;  Surgeon: Marty Heck, MD;  Location: [place];  Service: Vascular;  Laterality: Left;  . BASCILIC VEIN TRANSPOSITION Left 09/29/2018   Procedure: BASILIC VEIN TRANSPOSITION SECOND STAGE LEFT UPPER ARM;  Surgeon: Marty Heck, MD;  Location[facility];  Service: Vascular;  Laterality: Left;  . HEMATOMA EVACUATION Left 03/06/2018   Procedure: EVACUATION HEMATOMA LEFT ARM;  Surgeon: Angelia Mould, MD;  Location: [place];  Service: Vascular;  Laterality: Left;  . I&D EXTREMITY Left 10/06/2018   Procedure: IRRIGATION AND DEBRIDEMENT ARM;  Surgeon: Marty Heck, MD;  Location: [place];  Service: Vascular;  Laterality: Left;  . INSERTION OF DIALYSIS CATHETER Left 04/29/2018   Procedure: INSERTION OF DIALYSIS CATHETER LEFT INTERNAL JUGULAR;  Surgeon: Angelia Mould, MD;  Location: [place];  Service: Vascular;  Laterality:  Left;  . IR FLUORO GUIDE CV LINE RIGHT  02/18/2018  . IR FLUORO GUIDE CV LINE RIGHT  02/26/2018  . IR REMOVAL TUN CV CATH W/O FL  04/27/2018  . IR US GUIDE VASC ACCESS RIGHT  02/18/2018  . TEE WITHOUT CARDIOVERSION N/A 04/29/2018   Procedure: TRANSESOPHAGEAL ECHOCARDIOGRAM (TEE);  Surgeon: Sueanne Margarita, MD;  Location: [facility] ENDOSCOPY;  Service: Cardiovascular;  Laterality: N/A;     OB History   No obstetric history on file.      Home Medications    Prior to Admission medications   Medication Sig Start Date End Date Taking? Authorizing Provider  amLODipine (NORVASC) 5 MG tablet Take 1 tablet (5 mg total) by mouth at bedtime. 10/13/18  Yes Alphonzo Grieve, MD  calcitRIOL (ROCALTROL) 0.25 MCG capsule Take 1 capsule (0.25 mcg total) by mouth every Monday, Wednesday, and Friday with hemodialysis. 10/15/18  Yes Alphonzo Grieve, MD  insulin aspart (NOVOLOG) 100 UNIT/ML injection Inject 5 Units into the skin 3 (three) times daily with meals. 10/13/18  Yes Alphonzo Grieve, MD  insulin glargine (LANTUS) 100 UNIT/ML injection Inject 0.05 mLs (5 Units total) into the skin at bedtime. 10/13/18  Yes Alphonzo Grieve, MD  multivitamin (RENA-VIT) TABS tablet Take 1 tablet by mouth at bedtime. 10/13/18  Yes Alphonzo Grieve, MD  Blood Glucose Monitoring Suppl (ACCU-CHEK AVIVA) device Use as instructed 06/05/18 06/05/19  Clent Demark, PA-C  glucose blood (ACCU-CHEK AVIVA) test strip Use twice per day. 06/05/18   Clent Demark, PA-C  Insulin Syringe-Needle U-100 (INSULIN SYRINGE .3CC/31GX5/16") 31G X 5/16" 0.3 ML MISC Inject 1 each into the skin 4 (four) times daily. 06/27/18   Clent [place]ark, PA-C  Lancets (ACCU-CHEK SOFT [facility]) lancets Use twice per day. 06/05/18   Clent Demark, PA-C    Family History Family History  Problem Relation Age of Onset  . Autoimmune disease Neg Hx     Social History Social History   Tobacco Use  . Smoking status: Current Every Day Smoker    Packs/day: 0.50     Years: 48.00    Pack years: 24.00    Types: Cigarettes  . Smokeless tobacco: Never Used  . Tobacco comment: trying to quit ; smoking 1 1/2 cigarettes/day  (09/22/2018)  Substance Use Topics  . Alcohol use: Not Currently    Comment: 09/22/2018 "stopped about 6 months ago"  . Drug use: Yes    Types: Cocaine, Heroin    Comment: See notes     Allergies   Acetaminophen   Review of Systems Review of Systems  Gastrointestinal: Positive for abdominal pain and nausea.  All other systems reviewed and are negative.    Physical Exam Updated Vital Signs BP (!) 144/79   Pulse 96   Temp (!) 97.4 F (36.3 C) (Oral)   Resp 16   SpO2 98%   Physical Exam Vitals signs and nursing note  reviewed.  Constitutional:      Appearance: Normal appearance.  HENT:     Head: Normocephalic and atraumatic.     Right Ear: External ear normal.     Left Ear: External ear normal.     Mouth/Throat:     Mouth: Mucous membranes are dry.  Eyes:     Pupils: Pupils are equal, round, and reactive to light.  Neck:     Musculoskeletal: Normal range of motion and neck supple.  Cardiovascular:     Rate and Rhythm: Normal rate and regular rhythm.  Pulmonary:     Effort: Pulmonary effort is normal.     Breath sounds: Normal breath sounds.  Abdominal:     General: Abdomen is flat. There is distension.     Tenderness: There is abdominal tenderness.  Musculoskeletal:     Right lower leg: Edema present.     Left lower leg: Edema present.  Skin:    General: Skin is warm.     Capillary Refill: Capillary refill takes less than 2 seconds.  Neurological:     General: No focal deficit present.     Mental Status: She is alert and oriented to person, place, and time.  Psychiatric:        Mood and Affect: Mood normal.        Behavior: Behavior normal.      ED Treatments / Results  Labs (all labs ordered are listed, but only abnormal results are displayed) Labs Reviewed  LIPASE, BLOOD - Abnormal; Notable  for the following components:      Result Value   Lipase 118 (*)    All other components within normal limits  COMPREHENSIVE METABOLIC PANEL - Abnormal; Notable for the following components:   CO2 16 (*)    Glucose, Bld 55 (*)    BUN 87 (*)    Creatinine, Ser 12.74 (*)    Calcium 7.9 (*)    Albumin 2.3 (*)    GFR calc non Af Amer 3 (*)    GFR calc Af Amer 3 (*)    Anion gap 16 (*)    All other components within normal limits  CBC - Abnormal; Notable for the following components:   Hemoglobin 9.6 (*)    HCT 30.9 (*)    MCV 74.5 (*)    MCH 23.1 (*)    RDW 23.2 (*)    All other components within normal limits  MAGNESIUM - Abnormal; Notable for the following components:   Magnesium 1.5 (*)    All other components within normal limits  PHOSPHORUS - Abnormal; Notable for the following components:   Phosphorus 6.3 (*)    All other components within normal limits  CBG MONITORING, ED - Abnormal; Notable for the following components:   Glucose-Capillary 137 (*)    All other components within normal limits  CBG MONITORING, ED - Abnormal; Notable for the following components:   Glucose-Capillary 44 (*)    All other components within normal limits  CBG MONITORING, ED - Abnormal; Notable for the following components:   Glucose-Capillary 150 (*)    All other components within normal limits  CBG MONITORING, ED - Abnormal; Notable for the following components:   Glucose-Capillary 69 (*)    All other components within normal limits  CBG MONITORING, ED - Abnormal; Notable for the following components:   Glucose-Capillary 63 (*)    All other components within normal limits  ETHANOL    EKG None  Radiology Ct Abdomen Pelvis  W Contrast  Result Date: 10/23/2018 CLINICAL DATA:  Abdominal cramping and distension with 1 episode of vomiting. EXAM: CT ABDOMEN AND PELVIS WITH CONTRAST TECHNIQUE: Multidetector CT imaging of the abdomen and pelvis was performed using the standard protocol  following bolus administration of intravenous contrast. CONTRAST:  150mL OMNIPAQUE IOHEXOL 300 MG/ML  SOLN COMPARISON:  06/21/2018 FINDINGS: Lower chest: Scarring versus atelectasis in the bilateral lower lobes, right greater than left. Hepatobiliary: No focal liver abnormality is seen. Status post cholecystectomy. No biliary dilatation. Pancreas: Unremarkable. No pancreatic ductal dilatation or surrounding inflammatory changes. Spleen: Hypoattenuated mass within the spleen measures 14 mm, stable. There is also a stable 8 mm cyst in the inferior tip of the spleen. Adrenals/Urinary Tract: Adrenal glands are unremarkable. Kidneys are without renal calculi, focal lesion, or hydronephrosis. No contrast is seen within the collecting system on the delayed images. Bladder is unremarkable. Stomach/Bowel: Indistinctness of the walls of the duodenum without pathologic distension. Questionable circumferential thickening of the gastric antrum. No evidence of small-bowel obstruction. The colon is decompressed. Normal appendix. Vascular/Lymphatic: Mildly enlarged lymph nodes within the porta hepatis. Stable mildly prominent lymph nodes within the pericardial fat in right cardiophrenic angle. Reproductive: Status post hysterectomy. Fat containing left ovarian mass is stable at 15 mm. Other: Moderate volume abdominopelvic ascites. Water density nodules in the right abdominal wall may represent injection sites. Musculoskeletal: Posterior facet arthropathy in the lower lumbosacral spine. IMPRESSION: Interval development of moderate in volume abdominopelvic ascites. Thickening of the walls of the gastric antrum; indistinct appearance of the duodenum, findings which may be seen with enteritis. Lack of contrast excretion within the renal collecting systems, consistent with renal failure. Stable fat containing mass on the left ovary. Electronically Signed   By: Fidela Salisbury M.D.   On: 10/23/2018 10:43   Dg Chest Portable 1  View  Result Date: 10/23/2018 CLINICAL DATA:  Patient with cough and shortness of breath. EXAM: PORTABLE CHEST 1 VIEW COMPARISON:  Chest radiograph 10/05/2018 FINDINGS: Dual lumen central venous catheter tip projects over the superior vena cava. Monitoring leads overlie the patient. Stable cardiomegaly. Aortic atherosclerosis. Interval development patchy consolidative opacities right lower lung. Slight interval improvement bilateral interstitial pulmonary opacities. No pleural effusion or pneumothorax. IMPRESSION: Interval development peripheral consolidative opacities right lower lung concerning for infection in the appropriate clinical setting. Followup PA and lateral chest X-ray is recommended in 3-4 weeks following trial of antibiotic therapy to ensure resolution and exclude underlying malignancy. Electronically Signed   By: Lovey Newcomer M.D.   On: 10/23/2018 09:07    Procedures Procedures (including critical care time)  Medications Ordered in ED Medications  dextrose 10 % infusion ( Intravenous New Bag/Given 10/23/18 1026)  dextrose 50 % solution 50 mL (50 mLs Intravenous Given 10/23/18 0826)  ondansetron (ZOFRAN) injection 4 mg (4 mg Intravenous Given 10/23/18 0827)  iohexol (OMNIPAQUE) 300 MG/ML solution 100 mL (100 mLs Intravenous Contrast Given 10/23/18 0939)  morphine 4 MG/ML injection 4 mg (4 mg Intravenous Given 10/23/18 1107)     Initial Impression / Assessment and Plan / ED Course  I have reviewed the triage vital signs and the nursing notes.  Pertinent labs & imaging results that were available during my care of the patient were reviewed by me and considered in my medical decision making (see chart for details).    Pt has remained persistently hypoglycemic despite dextrose.  I was unable to give her food at first because of her abd pain and n/v.  She was started  on D10 while we were waiting for CT scan.  The pt's CT scan does not show anything surgical, so we then gave her some  food.  Abd pain and nausea likely due to ESRD and no dialysis for 10 days.  Pt said she does still want dialysis.   Pt said she still urinates, but she has not been able to urinate yet.  Pt d/w IMTS for admission.  She is willing to stay.  Pt d/w Dr. Jonnie Finner (nephrology) who will consult for dialysis.  CRITICAL CARE Performed by: Isla Pence   Total critical care time: 30 minutes  Critical care time was exclusive of separately billable procedures and treating other patients.  Critical care was necessary to treat or prevent imminent or life-threatening deterioration.  Critical care was time spent personally by me on the following activities: development of treatment plan with patient and/or surrogate as well as nursing, discussions with consultants, evaluation of patient's response to treatment, examination of patient, obtaining history from patient or surrogate, ordering and performing treatments and interventions, ordering and review of laboratory studies, ordering and review of radiographic studies, pulse oximetry and re-evaluation of patient's condition.  Final Clinical Impressions(s) / ED Diagnoses   Final diagnoses:  Hypoglycemia  Generalized abdominal pain  Non-intractable vomiting with nausea, unspecified vomiting type  ESRD on hemodialysis [facility])  Other ascites    ED Discharge Orders    None       Isla Pence, MD 10/23/18 1132

## 2018-10-23 NOTE — ED Notes (Signed)
Patient transported to CT 

## 2018-10-23 NOTE — ED Notes (Signed)
Pt is resting and appears comfortable.  She is sitting up in the stretcher playing crossword puzzle.  She is A&Ox 4, drink given to pt.  She denies any sob, cp, or dizziness.

## 2018-10-23 NOTE — ED Notes (Signed)
Report given to [facility], RN in Hemodialysis unit.

## 2018-10-23 NOTE — ED Triage Notes (Signed)
[facility]- pt coming from home with complaint of abdominal pain with some distention. She vomited X1 at home. CBG was initially 47, was unable to eat. 22g R hand.   167/102 D10- 7.5g  Most recent cbg 76 AOX4 4mg  of zofran.

## 2018-10-23 NOTE — ED Notes (Signed)
ED Provider at bedside. 

## 2018-10-23 NOTE — H&P (Signed)
Date: 10/23/2018               Patient Name:  Rhonda Lynch MRN: [id number]  DOB: May 26, 1956 Age / Sex: 63 y.o., female   PCP: Clent Demark, PA-C         Medical Service: Internal Medicine Teaching Service         Attending Physician: Dr. Annia Belt, MD    First Contact: Dr. Donne Hazel Pager: [phone]  Second Contact: Dr. Trilby Drummer Pager: [phone]       After Hours (After 5p/  First Contact Pager: [phone]  weekends / holidays): Second Contact Pager: [phone]   Chief Complaint: vomiting  History of Present Illness:  Ms. 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  Patient states that she has had 2 days of upper abdominal pain, with nausea, vomiting, cough productive of white/yellow sputum, mild shortness of breath, LE edema, subjective fevers, and overall feeling ill. Her vomit is actually phlegm from her cough; she denies hemoptysis. She has had 2 episodes of diarrhea yesterday, w/o melena or hematochezia; she denies chest pain or palpitations. She endorses that her roommate was recently diagnosed with the flu.   Patient recently admitted with hyperkalemia due to missing HD. She left AMA with a temporary HD cath thus has not been able to get HD since Feb 3rd. She states she has not had any trouble with her HD cath, no pain, and no drainage.  She was able to pick up her amlodipine and insulin after her hospitalization and has been taking them regularly. She denies feeling like she's had hypoglycemia; she doesn't have a glucometer at home. She has not yet followed up with her PCP.  Via EMS she was found to be hypoglycemic and treated with D10 inj. In the ED, she had recurrent hypoglycemia despite D10 injections so was started on D10 infusion. Labs revealed WBC of 10.4, Hgb 9.6 (b/l 8), plts 250. Mag was 1.5. Bmet revealed  anion gap metabolic acidosis with bicarb of 16, gap of 16; Cr was 12.75, BUN 87, serum glucose was 55. CXR revealed peripheral RLL consolidation. CT abd/pel with contrast revealed new moderate amount ascites, thickening of stomach indicative of gastritis, but no other acute findings. Nephrology was consulted for HD for refractory hypoglycemia likely in setting of insulin use without ability to get HD.   Meds:  Current Meds  Medication Sig  . amLODipine (NORVASC) 5 MG tablet Take 1 tablet (5 mg total) by mouth at bedtime.  . calcitRIOL (ROCALTROL) 0.25 MCG capsule Take 1 capsule (0.25 mcg total) by mouth every Monday, Wednesday, and Friday with hemodialysis.  Marland Kitchen insulin aspart (NOVOLOG) 100 UNIT/ML injection Inject 5 Units into the skin 3 (three) times daily with meals.  . insulin glargine (LANTUS) 100 UNIT/ML injection Inject 0.05 mLs (5 Units total) into the skin at bedtime.  . multivitamin (RENA-VIT) TABS tablet Take 1 tablet by mouth at bedtime.     Allergies: Allergies as of 10/23/2018 - Review Complete 10/23/2018  Allergen Reaction Noted  . Acetaminophen Nausea And Vomiting 06/22/2018   Past Medical History:  Diagnosis Date  . Diabetes mellitus without complication ([place])   . ESRD (end stage renal disease) on dialysis ([place])    "TTS; [facility]; they're moving me to another one" (09/22/2018)  . Hepatitis C  diagnosed 09/2018  . Hypertension   . Oxygen deficiency    2 L at night  . Stroke ([place])   . Substance abuse ([place])    has been clean for 4 months     Family History: No known family history  Social History: Trying to quit smoking; hasn't smoked in 1 week. Denies EtOH intake in the last few months - she is very proud of this. States last cocaine use was about 1 month ago.   Review of Systems: A complete ROS was negative except as per HPI.   Physical Exam: Blood pressure (!) 144/79, pulse 96, temperature (!) 97.4 F (36.3 C), temperature source Oral, resp. rate 16, SpO2  98 %. GENERAL- alert, co-operative, appears as stated age, not in any distress. HEENT- Atraumatic, normocephalic, EOMI, oral mucosa appears slightly dry CARDIAC- RRR, no murmurs, rubs or gallops. RESP- Moving equal volumes of air, and clear to auscultation bilaterally, no wheezes or crackles. ABDOMEN- Soft, non distended, bowel sounds present. Some upper abdominal tenderness. CHEST- HD cath in RIJ, no drainage, no rash, no tenderness. NEURO- No obvious Cr N abnormality. EXTREMITIES- pulse 2+, symmetric, no pedal edema. SKIN- Warm, dry, No rash or lesion. PSYCH- Normal mood and affect, appropriate thought content and speech.  EKG: personally reviewed my interpretation is sinus rhythm, single PVC, TWI only in V6  CXR: personally reviewed my interpretation is possible peripheral right lower lobe consolidation noted by radiology   CT abd/pel w/ contrast 10/23/2018 -Interval development of moderate in volume abdominopelvic ascites. -Thickening of the walls of the gastric antrum; indistinct appearance of the duodenum, findings which may be seen with enteritis. -Lack of contrast excretion within the renal collecting systems, consistent with renal failure. -Stable fat containing mass on the left ovary  Assessment & Plan by Problem: Active Problems:   ESRD (end stage renal disease) ([place])   Diabetes ([place])   Hypoglycemia  Hypoglycemia: In setting of insulin use without adequate dialysis. Currently on D10 gtt. --nephrology consulted by ED for HD  --presume d10 drip can be discontinued once she has had HD  ESRD on HD AGMA: Anion gap metabolic acidosis in setting of not having HD in a week and a half. She is afebrile and without leukocytosis so I don't think the acidosis is from infection.  --nephrology on board for HD --f/u renal function panel in AM  Productive cough, nausea, diarrhea: Afebrile and w/o leukocytosis. CT abd revealed moderate ascites which could be nephrogenic, also  with findings of gastritis. Her constellation of symptoms are flu-like and she has had sick contacts.  --f/u flu panel and RVP  Right lower lobe consolidation: Will repeat CXR after HD for better characterization. 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 --f/u repeat CXR after HD  HTN: At home on amlodipine. --amlodipine  Diet: soft, can advance to renal/cm if tolerating IVF: D10 VTE ppx: heparin sq Code: DNR - discussed on admission  Dispo: Admit patient to Inpatient with expected length of stay greater than 2 midnights.  Signed: Alphonzo Grieve, MD 10/23/2018, 11:19 AM  [phone]

## 2018-10-24 ENCOUNTER — Inpatient Hospital Stay (HOSPITAL_COMMUNITY): Payer: Medicare Other

## 2018-10-24 DIAGNOSIS — N186 End stage renal disease: Secondary | ICD-10-CM

## 2018-10-24 DIAGNOSIS — R252 Cramp and spasm: Secondary | ICD-10-CM

## 2018-10-24 DIAGNOSIS — A084 Viral intestinal infection, unspecified: Secondary | ICD-10-CM

## 2018-10-24 DIAGNOSIS — Z992 Dependence on renal dialysis: Secondary | ICD-10-CM

## 2018-10-24 DIAGNOSIS — J208 Acute bronchitis due to other specified organisms: Secondary | ICD-10-CM

## 2018-10-24 LAB — CBC
HCT: 26.4 % — ABNORMAL LOW (ref 36.0–46.0)
Hemoglobin: 8.6 g/dL — ABNORMAL LOW (ref 12.0–15.0)
MCH: 23.2 pg — AB (ref 26.0–34.0)
MCHC: 32.6 g/dL (ref 30.0–36.0)
MCV: 71.4 fL — ABNORMAL LOW (ref 80.0–100.0)
Platelets: 243 10*3/uL (ref 150–400)
RBC: 3.7 MIL/uL — ABNORMAL LOW (ref 3.87–5.11)
RDW: 22.7 % — ABNORMAL HIGH (ref 11.5–15.5)
WBC: 9 10*3/uL (ref 4.0–10.5)

## 2018-10-24 LAB — GLUCOSE, CAPILLARY
Glucose-Capillary: 105 mg/dL — ABNORMAL HIGH (ref 70–99)
Glucose-Capillary: 92 mg/dL (ref 70–99)
Glucose-Capillary: 120 mg/dL — ABNORMAL HIGH (ref 70–99)
Glucose-Capillary: 98 mg/dL (ref 70–99)
Glucose-Capillary: 115 mg/dL — ABNORMAL HIGH (ref 70–99)
Glucose-Capillary: 147 mg/dL — ABNORMAL HIGH (ref 70–99)

## 2018-10-24 LAB — RENAL FUNCTION PANEL
Albumin: 2.2 g/dL — ABNORMAL LOW (ref 3.5–5.0)
Anion gap: 8 (ref 5–15)
BUN: 54 mg/dL — ABNORMAL HIGH (ref 8–23)
CO2: 20 mmol/L — AB (ref 22–32)
Calcium: 7.6 mg/dL — ABNORMAL LOW (ref 8.9–10.3)
Chloride: 106 mmol/L (ref 98–111)
Creatinine, Ser: 8.82 mg/dL — ABNORMAL HIGH (ref 0.44–1.00)
GFR calc Af Amer: 5 mL/min — ABNORMAL LOW (ref >60)
GFR calc non Af Amer: 4 mL/min — ABNORMAL LOW (ref >60)
Glucose, Bld: 111 mg/dL — ABNORMAL HIGH (ref 70–99)
Phosphorus: 5.4 mg/dL — ABNORMAL HIGH (ref 2.5–4.6)
Potassium: 4.3 mmol/L (ref 3.5–5.1)
SODIUM: 134 mmol/L — AB (ref 135–145)

## 2018-10-24 LAB — HEPATITIS B SURFACE ANTIGEN
HEP B S AG: NEGATIVE
Hepatitis B Surface Ag: NEGATIVE

## 2018-10-24 MED ORDER — HEPARIN SODIUM (PORCINE) 1000 UNIT/ML IJ SOLN
INTRAMUSCULAR | Status: AC
  Filled 2018-10-24: qty 4

## 2018-10-24 MED ORDER — CALCITRIOL 0.25 MCG PO CAPS
ORAL_CAPSULE | ORAL | Status: AC
  Filled 2018-10-24: qty 1

## 2018-10-24 MED ORDER — HEPARIN SODIUM (PORCINE) 1000 UNIT/ML IJ SOLN
3.4000 mL | Freq: Once | INTRAMUSCULAR | Status: AC
  Administered 2018-10-24: 3400 [IU] via INTRAVENOUS

## 2018-10-24 MED ORDER — HEPARIN SODIUM (PORCINE) 1000 UNIT/ML DIALYSIS: 1500.0000 [IU] | INTRAMUSCULAR | Status: DC | PRN

## 2018-10-24 MED ORDER — CALCIUM CARBONATE ANTACID 500 MG PO CHEW
1.0000 | CHEWABLE_TABLET | Freq: Two times a day (BID) | ORAL | Status: AC
  Administered 2018-10-24 (×2): 200 mg via ORAL
  Filled 2018-10-24 (×2): qty 1

## 2018-10-24 MED ORDER — HEPARIN SODIUM (PORCINE) 1000 UNIT/ML IJ SOLN
1.5000 mL | Freq: Once | INTRAMUSCULAR | Status: AC
  Administered 2018-10-24: 1500 [IU] via INTRAVENOUS

## 2018-10-24 MED ORDER — CALCIUM CARBONATE ANTACID 500 MG PO CHEW: 1.0000 | CHEWABLE_TABLET | Freq: Two times a day (BID) | ORAL | Status: DC

## 2018-10-24 MED ORDER — HEPARIN SODIUM (PORCINE) 1000 UNIT/ML IJ SOLN
INTRAMUSCULAR | Status: AC
  Filled 2018-10-24: qty 2

## 2018-10-24 NOTE — Progress Notes (Signed)
Medicine attending: Examined this patient today together with resident physician Dr Francesco Runner and I concur with her evaluation and management plan. Respiratory and GI symptoms have improved significantly overnight and coincidental with dialysis. We appreciate prompt vascular surgery assistance. She needs a permanent dialysis access.  Problems with recurrent thrombosis of AV fistulae.  An arterial vein graft surgery planned for early next week.  Discussed the importance of this surgery with the patient. Persistent vague underwhelming patchy opacity right lower lobe on 2 view chest film.  Clinically insignificant. Impression: 1.  Acute viral bronchitis and gastroenteritis improving with supportive care. 2.  Hypoglycemia secondary to insulin use with concomitant noncompliance with dialysis and decreased oral intake from viral gastroenteritis. 3.  End-stage renal disease on dialysis.  Etiology of renal failure felt to be ANCA vasculitis. 4.  History of polysubstance abuse. 5.  Untreated hepatitis C infection 6.  Failure of previous AV fistulas.  Plan to place a AV graft.

## 2018-10-24 NOTE — Accreditation Note (Signed)
Back from HD by bed awake and alert. °

## 2018-10-24 NOTE — Progress Notes (Signed)
   Subjective: Feeling well today. She required additional pain medicine overnight but abdominal pain is much better this morning. Discussed plan for permanent dialysis access. At this time, she still wants to continue HD.    Objective:  Vital signs in last 24 hours: Vitals:   10/23/18 2355 10/24/18 0007 10/24/18 0314 10/24/18 0808  BP: (!) 149/95 (!) 149/95 (!) 155/98 (!) 148/85  Pulse: (!) 107  (!) 111 (!) 110  Resp: 16  (!) 22 13  Temp: 98.7 F (37.1 C)  98.2 F (36.8 C) 99 F (37.2 C)  TempSrc: Oral  Oral Oral  SpO2: 99%  99% 100%  Weight:       Gen: sitting up in bed, NAD Pulm: faint bibasilar crackles Cardiac: RRR, no m/r/g Abd: soft, NT, ND Ext: warm, well perfused, no LEE  Assessment/Plan:  Active Problems:   ESRD (end stage renal disease) ([facility])   Diabetes ([facility])   Non-compliance with renal dialysis ([place])   Hypoglycemia  [name] with 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   Hypoglycemia: 2/2 insulin use, decrease po intake, noncompliance with HD. Initially needed D10 drip. She was dialyzed yesterday. Hypoglycemia resolved overnight. D10 drip was discontinued and she is tolerating a regular diet.  - CBG tid wc and qhs  ESRD on HD: historically noncompliant with HD and frequent changes in goals of care. At this time, she would like to continue with HD and pursue permanent access and outpt dialysis center - HD per nephrology, metabolic acidosis resolved after HD - vascular surgery consult for permanent HD access. She's had two failed AVFs. Last admission vascular was  planning to do an AVG but she left AMA.   Muscle Cramps: most likely secondary to HD, calcium is also low. She states mustard has helped in the past - tums to increase calcium - instructed her to ask nurse for mustard packets   Acute viral syndrome with respiratory and GI symptoms: Improving. RVP and flu panel negative. Except some of these symptoms were due to uremia  - supportive care, HD as above   Right lower lobe consolidation: CXR post HD shows improvement.   HTN: continue home amlodipine  Untreated hep C: LFTs normal, no focal abnormality on CT. Will need outpt f/u in hep C clinic  Dispo: Anticipated discharge unclear. Pending permanent dialysis access and CLIP process  Isabelle Course, MD 10/24/2018, 11:03 AM Pager: [phone]

## 2018-10-24 NOTE — Progress Notes (Signed)
[facility] Progress Note  Subjective: no new c/o's, had HD yest w/ no BP drops and UF of 2.7L  Vitals:   10/23/18 2355 10/24/18 0007 10/24/18 0314 10/24/18 0808  BP: (!) 149/95 (!) 149/95 (!) 155/98 (!) 148/85  Pulse: (!) 107  (!) 111 (!) 110  Resp: 16  (!) 22 13  Temp: 98.7 F (37.1 C)  98.2 F (36.8 C) 99 F (37.2 C)  TempSrc: Oral  Oral Oral  SpO2: 99%  99% 100%  Weight:        Inpatient medications: . amLODipine  5 mg Oral QHS  . calcitRIOL  0.25 mcg Oral Q M,W,F-HD  . Chlorhexidine Gluconate Cloth  6 each Topical Q0600  . Chlorhexidine Gluconate Cloth  6 each Topical Q0600  . heparin  5,000 Units Subcutaneous Q8H  . multivitamin  1 tablet Oral QHS    diphenhydrAMINE-zinc acetate, HYDROmorphone (DILAUDID) injection, ondansetron (ZOFRAN) IV, promethazine, zolpidem  Iron/TIBC/Ferritin/ %Sat    Component Value Date/Time   IRON 42 09/23/2018 2113   TIBC 291 09/23/2018 2113   FERRITIN 573 (H) 09/23/2018 2113   IRONPCTSAT 14 09/23/2018 2113    Exam: Gen alert in no distress No jvd or bruits Chest no sig rales or wheezing, occ rhonchi, coughing RRR no w/ 2/6 holo syst M, no RG Abd soft ntnd no mass or ascites +bs Ext  No LE edema Neuro is alert, Ox 3 , nf R IJ TDC,  LUA avf wounds healed, no bruit    Home meds:  - amlodipine 5 hs  - insulin lantus 5 hs/ insulin novolog 5u tid ac  - MVI/ vit D w HD  Previously dialyzed at [facility] (these are old orders from late 2019)   4hr   59kg  TDC   Hep none  ( LUA AVF failed in Jan 2020); at time of dc was lowered to 3.5h and low dose heparin  Assessment/ Plan 1. Abd pain /N/ V: some of this may be uremia, no HD x 10d 2. Uremia/ ESRD : does not have an OP HD unit. Discharged from [facility] due to noncompliance late last year. Plan HD again today then Monday. Resume CLIP process here. If will not be accepted by [facility] will need to investigate other groups/ facilities in the area. 3. HD access: has of failed AVF x  2.  Per [facility] notes last admit will need new AVG.  Consulting [facility].  4. 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 Secondary hyperparathyroidism: CorrCa/Phos ok 6. HTN/volume: BP's up , needs volume removal w HD 7. Nutrition: Alb2.3, better than last admit 8. 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 Hep C: Active infection. 10. Hx of ANCA GN and BOOP: mid 2019, treated w/ immunosuppression 2-3 months. No recurrences.       [facility] 10/24/2018, 9:02 AM  Recent Labs  Lab 10/23/18 0657 10/23/18 0832 10/24/18 0300  NA 140  --  134*  K 4.5  --  4.3  CL 108  --  106  CO2 16*  --  20*  GLUCOSE 55*  --  111*  BUN 87*  --  54*  CREATININE 12.74*  --  8.82*  CALCIUM 7.9*  --  7.6*  PHOS  --  6.3* 5.4*  ALBUMIN 2.3*  --  2.2*   Recent Labs  Lab 10/23/18 0657  AST 30  ALT 20  ALKPHOS 86  BILITOT 0.6  PROT 6.6   Recent Labs  Lab 10/23/18 0657 10/24/18 0300  WBC 10.4 9.0  HGB 9.6* 8.6*  HCT 30.9* 26.4*  MCV 74.5* 71.4*  PLT 250 243

## 2018-10-24 NOTE — Consult Note (Signed)
Hospital Consult    Reason for Consult:  Dialysis access Requesting Physician:  Melvia Heaps MRN #:  [id number]  History of Present Illness: This is a 63 y.o. female who underwent left BC AVF on 03/05/18 by Dr. Donnetta Hutching.  The next day, she was taken back to the OR for evacuation of small hematoma by Dr. Scot Dock.  In August, they attempted to use her left brachiocephalic fistula and felt that it may be occluded.  On 04/29/18, she was taken to the OR and had a tunneled dialysis catheter placed by Dr. Scot Dock.  In November, [facility] was consulted for access.  She underwent a 1st stage BVT (left) on 07/21/18 by Dr. Carlis Abbott.  She subsequently underwent 2nd stage BVT on 09/29/2018 also by Dr. Carlis Abbott.   On 10/06/2018, she was having persistent fevers and there was concern for left arm abscess and she was taken to OR and underwent I&D of left arm fluid collection with drain placement. On 10/13/2018, Dr. Carlis Abbott discussed with the pt proceeding with left arm venogram and AVG when WBC was trending downward.  She has presented to the hospital and consult requested to for access.   Pt seen in dialysis and does understand and wants to proceed with access.  She denies pain or numbness in her hand.    Past Medical History:  Diagnosis Date  . Diabetes mellitus without complication ([place])   . ESRD (end stage renal disease) on dialysis ([place])    "TTS; [facility]; they're moving me to another one" (09/22/2018)  . Hepatitis C    diagnosed 09/2018  . Hypertension   . Oxygen deficiency    2 L at night  . Stroke ([place])   . Substance abuse ([place])    has been clean for 4 months     Past Surgical History:  Procedure Laterality Date  . AV FISTULA PLACEMENT Left 03/05/2018   Procedure: ARTERIOVENOUS (AV) FISTULA CREATION  LEFT UPPER EXTREMITY;  Surgeon: [name], MD;  Location: [facility];  Service: Vascular;  Laterality: Left;  . AV FISTULA PLACEMENT Left 07/21/2018   Procedure: ARTERIOVENOUS (AV) FISTULA CREATION;  Surgeon: Marty Heck, MD;  Location: [facility];  Service: Vascular;  Laterality: Left;  . BASCILIC VEIN TRANSPOSITION Left 09/29/2018   Procedure: BASILIC VEIN TRANSPOSITION SECOND STAGE LEFT UPPER ARM;  Surgeon: Marty Heck, MD;  Location: [facility];  Service: Vascular;  Laterality: Left;  . HEMATOMA EVACUATION Left 03/06/2018   Procedure: EVACUATION HEMATOMA LEFT ARM;  Surgeon: Angelia Mould, MD;  Location: [facility];  Service: Vascular;  Laterality: Left;  . I&D EXTREMITY Left 10/06/2018   Procedure: IRRIGATION AND DEBRIDEMENT ARM;  Surgeon: Marty Heck, MD;  Location: [facility];  Service: Vascular;  Laterality: Left;  . INSERTION OF DIALYSIS CATHETER Left 04/29/2018   Procedure: INSERTION OF DIALYSIS CATHETER LEFT INTERNAL JUGULAR;  Surgeon: Angelia Mould, MD;  Location: [facility];  Service: Vascular;  Laterality: Left;  . IR FLUORO GUIDE CV LINE RIGHT  02/18/2018  . IR FLUORO GUIDE CV LINE RIGHT  02/26/2018  . IR REMOVAL TUN CV CATH W/O FL  04/27/2018  . IR US GUIDE VASC ACCESS RIGHT  02/18/2018  . TEE WITHOUT CARDIOVERSION N/A 04/29/2018   Procedure: TRANSESOPHAGEAL ECHOCARDIOGRAM (TEE);  Surgeon: Sueanne Margarita, MD;  Location: [facility] ENDOSCOPY;  Service: Cardiovascular;  Laterality: N/A;    Allergies  Allergen Reactions  . Acetaminophen Nausea And Vomiting    Patient states Acetaminophen and Acetaminophen containing products make  her nauseated. She demonstrated this 06/21/18 with nausea followed by emesis.  Marland Kitchen Hydroxyzine Itching    Prior to Admission medications   Medication Sig Start Date End Date Taking? Authorizing Provider  amLODipine (NORVASC) 5 MG tablet Take 1 tablet (5 mg total) by mouth at bedtime. 10/13/18  Yes Alphonzo Grieve, MD  calcitRIOL (ROCALTROL) 0.25 MCG capsule Take 1 capsule (0.25 mcg total) by mouth every Monday, Wednesday, and Friday with hemodialysis. 10/15/18  Yes Alphonzo Grieve, MD  insulin aspart (NOVOLOG) 100 UNIT/ML injection Inject 5 Units into the skin 3  (three) times daily with meals. 10/13/18  Yes Alphonzo Grieve, MD  insulin glargine (LANTUS) 100 UNIT/ML injection Inject 0.05 mLs (5 Units total) into the skin at bedtime. 10/13/18  Yes Alphonzo Grieve, MD  multivitamin (RENA-VIT) TABS tablet Take 1 tablet by mouth at bedtime. 10/13/18  Yes Alphonzo Grieve, MD  Blood Glucose Monitoring Suppl (ACCU-CHEK AVIVA) device Use as instructed 06/05/18 06/05/19  Clent Demark, PA-C  glucose blood (ACCU-CHEK AVIVA) test strip Use twice per day. 06/05/18   Clent Demark, PA-C  Insulin Syringe-Needle U-100 (INSULIN SYRINGE .3CC/31GX5/16") 31G X 5/16" 0.3 ML MISC Inject 1 each into the skin 4 (four) times daily. 06/27/18   Clent Demark, PA-C  Lancets (ACCU-CHEK SOFT [facility]) lancets Use twice per day. 06/05/18   Clent Demark, PA-C    Social History   Socioeconomic History  . Marital status: Single    Spouse name: Not on file  . Number of children: Not on file  . Years of education: Not on file  . Highest education level: Not on file  Occupational History  . Not on file  Social Needs  . Financial resource strain: Not on file  . Food insecurity:    Worry: Not on file    Inability: Not on file  . Transportation needs:    Medical: Not on file    Non-medical: Not on file  Tobacco Use  . Smoking status: Current Every Day Smoker    Packs/day: 0.50    Years: 48.00    Pack years: 24.00    Types: Cigarettes  . Smokeless tobacco: Never Used  . Tobacco comment: trying to quit ; smoking 1 1/2 cigarettes/day  (09/22/2018)  Substance and Sexual Activity  . Alcohol use: Not Currently    Comment: 09/22/2018 "stopped about 6 months ago"  . Drug use: Yes    Types: Cocaine, Heroin    Comment: See notes  . Sexual activity: Not Currently    Birth control/protection: None  Lifestyle  . Physical activity:    Days per week: Not on file    Minutes per session: Not on file  . Stress: Not on file  Relationships  . Social connections:    Talks on  phone: Not on file    Gets together: Not on file    Attends religious service: Not on file    Active member of club or organization: Not on file    Attends meetings of clubs or organizations: Not on file    Relationship status: Not on file  . Intimate partner violence:    Fear of current or ex partner: Not on file    Emotionally abused: Not on file    Physically abused: Not on file    Forced sexual activity: Not on file  Other Topics Concern  . Not on file  Social History Narrative   Per notes, Pt lives with her son.     Family  History  Problem Relation Age of Onset  . Autoimmune disease Neg Hx     ROS: [x]  Positive   [ ]  Negative   [ ]  All sytems reviewed and are negative See HPI  Physical Examination  Vitals:   10/24/18 1259 10/24/18 1330  BP: 129/81 (!) 143/88  Pulse: 98 (!) 108  Resp: 13 13  Temp:    SpO2:     Body mass index is 26.12 kg/m.  General:  WDWN in NAD Gait: Not observed HENT: WNL, normocephalic Pulmonary: normal non-labored breathing Cardiac: regular Skin:incisions healed nicely. Vascular Exam/Pulses: Easily palpable left radial and ulnar pulses; strong left hand grip. Musculoskeletal: no muscle wasting or atrophy  Neurologic: A&O X 3;  No focal weakness or paresthesias are detected; speech is fluent/normal Psychiatric:  The pt has Normal affect.  CBC    Component Value Date/Time   WBC 9.0 10/24/2018 0300   RBC 3.70 (L) 10/24/2018 0300   HGB 8.6 (L) 10/24/2018 0300   HGB 8.0 (L) 03/27/2018 1107   HCT 26.4 (L) 10/24/2018 0300   HCT 25.3 (L) 03/27/2018 1107   PLT 243 10/24/2018 0300   PLT 126 (L) 03/27/2018 1107   MCV 71.4 (L) 10/24/2018 0300   MCV 83 03/27/2018 1107   MCH 23.2 (L) 10/24/2018 0300   MCHC 32.6 10/24/2018 0300   RDW 22.7 (H) 10/24/2018 0300   RDW 19.9 (H) 03/27/2018 1107   LYMPHSABS 1.8 09/22/2018 1317   LYMPHSABS 0.5 (L) 03/27/2018 1107   MONOABS 0.6 09/22/2018 1317   EOSABS 0.1 09/22/2018 1317   EOSABS 0.0  03/27/2018 1107   BASOSABS 0.1 09/22/2018 1317   BASOSABS 0.0 03/27/2018 1107    BMET    Component Value Date/Time   NA 134 (L) 10/24/2018 0300   NA 135 03/25/2018 1014   K 4.3 10/24/2018 0300   CL 106 10/24/2018 0300   CO2 20 (L) 10/24/2018 0300   GLUCOSE 111 (H) 10/24/2018 0300   BUN 54 (H) 10/24/2018 0300   BUN 96 (HH) 03/25/2018 1014   CREATININE 8.82 (H) 10/24/2018 0300   CALCIUM 7.6 (L) 10/24/2018 0300   CALCIUM 8.3 (L) 09/23/2018 1329   GFRNONAA 4 (L) 10/24/2018 0300   GFRAA 5 (L) 10/24/2018 0300    COAGS: Lab Results  Component Value Date   INR 0.96 09/29/2018   INR 0.97 07/23/2018   INR 0.98 07/22/2018     Non-Invasive Vascular Imaging:   none    ASSESSMENT/PLAN: This is a 63 y.o. female with ESRD who has failed a couple of previous fistulas in need of dialysis access.   -pt seen by Dr. Carlis Abbott on 10/13/2018 and felt the pt needs a left arm venogram and left arm AVG once WBC normal and it is now.   -will plan for this early next week.  -Dr. Oneida Alar to see pt later today.   Leontine Locket, PA-C [facility] [phone]

## 2018-10-24 NOTE — Progress Notes (Signed)
Transported to HD by bed awake and alert. °

## 2018-10-24 NOTE — Plan of Care (Signed)
  Problem: Education: Goal: Knowledge of General Education information will improve Description: Including pain rating scale, medication(s)/side effects and non-pharmacologic comfort measures Outcome: Progressing   Problem: Health Behavior/Discharge Planning: Goal: Ability to manage health-related needs will improve Outcome: Progressing   Problem: Clinical Measurements: Goal: Will remain free from infection Outcome: Progressing   

## 2018-10-25 DIAGNOSIS — Z8619 Personal history of other infectious and parasitic diseases: Secondary | ICD-10-CM

## 2018-10-25 DIAGNOSIS — Z792 Long term (current) use of antibiotics: Secondary | ICD-10-CM

## 2018-10-25 LAB — GLUCOSE, CAPILLARY
Glucose-Capillary: 100 mg/dL — ABNORMAL HIGH (ref 70–99)
Glucose-Capillary: 87 mg/dL (ref 70–99)
Glucose-Capillary: 90 mg/dL (ref 70–99)
Glucose-Capillary: 123 mg/dL — ABNORMAL HIGH (ref 70–99)

## 2018-10-25 LAB — HEPATITIS B SURFACE ANTIBODY,QUALITATIVE: Hep B S Ab: NONREACTIVE

## 2018-10-25 LAB — HEPATITIS B CORE ANTIBODY, TOTAL: Hep B Core Total Ab: NEGATIVE

## 2018-10-25 MED ORDER — HYDROMORPHONE HCL 2 MG PO TABS
1.0000 mg | ORAL_TABLET | Freq: Four times a day (QID) | ORAL | Status: DC | PRN
  Administered 2018-10-25 – 2018-10-28 (×8): 1 mg via ORAL
  Filled 2018-10-25 (×8): qty 1

## 2018-10-25 MED ORDER — HYDROMORPHONE HCL 2 MG PO TABS: 1.0000 mg | ORAL_TABLET | Freq: Four times a day (QID) | ORAL | Status: DC | PRN

## 2018-10-25 NOTE — Progress Notes (Signed)
Pt for left central venogram and left arm AV graft Monday Procedure risks benefits discussed with pt today  Ruta Hinds, MD [facility] Office: [phone] Pager: [phone]

## 2018-10-25 NOTE — Progress Notes (Signed)
[facility] Progress Note  Subjective: no c/o's today. Had HD yesterday w 3 L NET removed w/o difficulty.  BP's were low 110's at completion of HD.   Vitals:   10/24/18 1731 10/24/18 1917 10/25/18 0528 10/25/18 1148  BP: 116/62 123/75 125/90 138/75  Pulse:    88  Resp: 16     Temp: 99.2 F (37.3 C) 98.7 F (37.1 C) 99.2 F (37.3 C) 98.3 F (36.8 C)  TempSrc: Oral Oral Oral Oral  SpO2:  98% 100% 100%  Weight:        Inpatient medications: . amLODipine  5 mg Oral QHS  . calcitRIOL  0.25 mcg Oral Q M,W,F-HD  . Chlorhexidine Gluconate Cloth  6 each Topical Q0600  . Chlorhexidine Gluconate Cloth  6 each Topical Q0600  . heparin  5,000 Units Subcutaneous Q8H  . multivitamin  1 tablet Oral QHS    diphenhydrAMINE-zinc acetate, ondansetron (ZOFRAN) IV, promethazine, zolpidem  Iron/TIBC/Ferritin/ %Sat    Component Value Date/Time   IRON 42 09/23/2018 2113   TIBC 291 09/23/2018 2113   FERRITIN 573 (H) 09/23/2018 2113   IRONPCTSAT 14 09/23/2018 2113    Exam: Gen alert in no distress No jvd or bruits Chest no sig rales or wheezing, occ rhonchi, coughing RRR no w/ 2/6 holo syst M, no RG Abd soft ntnd no mass or ascites +bs Ext  No LE edema Neuro is alert, Ox 3 , nf R IJ TDC,  LUA avf wounds healed, no bruit    Home meds:  - amlodipine 5 hs  - insulin lantus 5 hs/ insulin novolog 5u tid ac  - MVI/ vit D w HD  Previously dialyzed at [place] (these are old orders from late 2019)   4hr   59kg  TDC   Hep none  ( LUA AVF failed in Jan 2020); at time of dc was lowered to 3.5h and low dose heparin  Assessment/ Plan 1. Uremia/ ESRD : does not have an OP HD unit. Discharged from [place] due to noncompliance late last year. Has had HD here x 2, went 10 days w/o HD pta. Plan next HD here Monday.  Resuming CLIP process here. If will not be accepted by [facility] will need to investigate other groups/ facilities in the area. 2. Nausea/ vomiting: prob d/t uremia, resolved w/  HD 3. HD access: has of failed AVF x 2.  VVS has seen, planning central venogram and new perm access next week.   4. 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 Secondary hyperparathyroidism: CorrCa/Phos ok 6. HTN/volume: BP's coming down w/ vol removal. BP's good today.  7. Nutrition: Alb2.3, better than last admit 8. 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 Hep C: Active infection. 10. Hx of ANCA GN and BOOP: mid 2019, treated w/ immunosuppression 2-3 months. No recurrences.       [facility] 10/25/2018, 12:20 PM  Recent Labs  Lab 10/24/18 0300 10/25/18 0235  NA 134* 135  K 4.3 3.8  CL 106 99  CO2 20* 25  GLUCOSE 111* 82  BUN 54* 18  CREATININE 8.82* 4.77*  CALCIUM 7.6* 8.1*  PHOS 5.4* 4.8*  ALBUMIN 2.2* 2.2*   Recent Labs  Lab 10/23/18 0657  AST 30  ALT 20  ALKPHOS 86  BILITOT 0.6  PROT 6.6   Recent Labs  Lab 10/23/18 0657 10/24/18 0300  WBC 10.4 9.0  HGB 9.6* 8.6*  HCT 30.9* 26.4*  MCV 74.5* 71.4*  PLT 250 243

## 2018-10-25 NOTE — Progress Notes (Signed)
  Date: 10/25/2018  Patient name: Rhonda Lynch  Medical record number: [id number]  Date of birth: [birth year]   I have seen and evaluated this patient and I have discussed the plan of care with the house staff. Please see their note for complete details.  Lenice Pressman, M.D., Ph.D. 10/25/2018, 2:29 PM

## 2018-10-25 NOTE — Progress Notes (Signed)
   Subjective: Feeling well this morning. No complaints. Discussed plan for permanent vascular access early next week and continued inpatient HD. She understood and had no questions  Objective:  Vital signs in last 24 hours: Vitals:   10/24/18 1730 10/24/18 1731 10/24/18 1917 10/25/18 0528  BP:  116/62 123/75 125/90  Pulse:      Resp:  16    Temp:  99.2 F (37.3 C) 98.7 F (37.1 C) 99.2 F (37.3 C)  TempSrc:  Oral Oral Oral  SpO2: 100%  98% 100%  Weight:       Gen: sitting up in bed, NAD, doing a cross word puzzle  Pulm: CTAB Cardiac: RRR, no m/r/g Abd: soft, NT, ND Ext: warm, well perfused, no LEE  Assessment/Plan:  Active Problems:   ESRD (end stage renal disease) (HCC)   Diabetes (HCC)   Non-compliance with renal dialysis ([place])   Hypoglycemia   ESRD on hemodialysis ([place])  [name] with 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   ESRD on HD: historically noncompliant with HD and frequent changes in goals of care. At this time, she would like to continue with HD and pursue permanent access and outpt dialysis center - HD per nephrology - vascular surgery consult for permanent HD access. She's had two failed AVFs. Planning for AVG around 2/18.   Hypoglycemia: resolved after d10 an HD  Acute viral syndrome with respiratory and GI symptoms: Resolved after HD. RVP and flu panel negative. Most likely related to uremia.   HTN: continue home amlodipine  Untreated hep C: LFTs normal, no focal abnormality on CT. Will need outpt f/u in hep C clinic  Dispo: Anticipated discharge  unclear. Pending permanent dialysis access and CLIP process  Isabelle Course, MD 10/25/2018, 9:54 AM Pager: [phone]

## 2018-10-26 LAB — GLUCOSE, CAPILLARY
Glucose-Capillary: 113 mg/dL — ABNORMAL HIGH (ref 70–99)
Glucose-Capillary: 112 mg/dL — ABNORMAL HIGH (ref 70–99)
Glucose-Capillary: 92 mg/dL (ref 70–99)
Glucose-Capillary: 197 mg/dL — ABNORMAL HIGH (ref 70–99)

## 2018-10-26 LAB — RENAL FUNCTION PANEL
ANION GAP: 11 (ref 5–15)
Albumin: 2.2 g/dL — ABNORMAL LOW (ref 3.5–5.0)
BUN: 18 mg/dL (ref 8–23)
CO2: 25 mmol/L (ref 22–32)
Calcium: 8.1 mg/dL — ABNORMAL LOW (ref 8.9–10.3)
Chloride: 99 mmol/L (ref 98–111)
Creatinine, Ser: 4.77 mg/dL — ABNORMAL HIGH (ref 0.44–1.00)
GFR calc Af Amer: 10 mL/min — ABNORMAL LOW (ref >60)
GFR calc non Af Amer: 9 mL/min — ABNORMAL LOW (ref >60)
Glucose, Bld: 82 mg/dL (ref 70–99)
Phosphorus: 4.8 mg/dL — ABNORMAL HIGH (ref 2.5–4.6)
Potassium: 3.8 mmol/L (ref 3.5–5.1)
Sodium: 135 mmol/L (ref 135–145)

## 2018-10-26 MED ORDER — CHLORHEXIDINE GLUCONATE CLOTH 2 % EX PADS: 6.0000 | MEDICATED_PAD | Freq: Every day | CUTANEOUS | Status: DC

## 2018-10-26 MED ORDER — CEFAZOLIN SODIUM-DEXTROSE 2-4 GM/100ML-% IV SOLN
2.0000 g | INTRAVENOUS | Status: AC
  Administered 2018-10-27: 2 g via INTRAVENOUS
  Filled 2018-10-26 (×2): qty 100

## 2018-10-26 NOTE — Progress Notes (Signed)
   Subjective: Patient seen at bedside, state she is feeling well this morning. No complaints. We continue to await access early next week and CLIP process for outpatient HD.  Objective:  Vital signs in last 24 hours: Vitals:   10/25/18 1148 10/25/18 1525 10/25/18 2057 10/25/18 2142  BP: 138/75 122/85 130/71 (!) 131/98  Pulse: 88 95 94   Resp:   16   Temp: 98.3 F (36.8 C) 98.9 F (37.2 C) 98.2 F (36.8 C)   TempSrc: Oral Oral Oral   SpO2: 100% 100%    Weight:       Gen: sitting up in bed, NAD Pulm: CTAB Cardiac: RRR, no m/r/g Abd: soft, NT, ND Ext: warm, well perfused, no LEE  Assessment/Plan:  Active Problems:   ESRD (end stage renal disease) (HCC)   Diabetes (HCC)   Non-compliance with renal dialysis ([place])   Hypoglycemia   ESRD on hemodialysis ([place])  [name] with 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   ESRD on HD: Stable. Historically noncompliant with HD and frequent changes in goals of care. At this time, she would like to continue with HD and pursue permanent access and outpt dialysis center - HD per nephrology - Vascular surgery consult for permanent HD access. She's had two failed AVFs. Planning for AVG around 2/18.   Acute viral syndrome with respiratory and GI symptoms: Resolved after HD. RVP and flu panel negative. Most likely related to uremia.   HTN: continue home amlodipine  Untreated hep C: LFTs normal, no focal abnormality on CT. Will need outpt f/u in hep C clinic  Hypoglycemia: Resolved. S/P d10 and HD  Dispo: Anticipated discharge unclear.  Pending permanent dialysis access and CLIP process  Neva Seat, MD 10/26/2018, 6:18 AM

## 2018-10-26 NOTE — Plan of Care (Signed)

## 2018-10-26 NOTE — Progress Notes (Signed)
  Date: 10/26/2018  Patient name: Rhonda Lynch  Medical record number: [id number]  Date of birth: [birth year]   I reviewed the medical history and physical examination as documented by the resident physician.I concur with the assessment and plan as documented. I was immediately available to the resident for questions and consultation.  Lenice Pressman, M.D., Ph.D. 10/26/2018, 2:08 PM

## 2018-10-26 NOTE — Progress Notes (Signed)
[facility] Progress Note  Subjective: no c/o's today.   Vitals:   10/25/18 1525 10/25/18 2057 10/25/18 2142 10/26/18 0756  BP: 122/85 130/71 (!) 131/98 136/87  Pulse: 95 94  95  Resp:  16    Temp: 98.9 F (37.2 C) 98.2 F (36.8 C)  98.5 F (36.9 C)  TempSrc: Oral Oral  Oral  SpO2: 100%   100%  Weight:        Inpatient medications: . amLODipine  5 mg Oral QHS  . calcitRIOL  0.25 mcg Oral Q M,W,F-HD  . Chlorhexidine Gluconate Cloth  6 each Topical Q0600  . heparin  5,000 Units Subcutaneous Q8H  . multivitamin  1 tablet Oral QHS   . [START ON 10/27/2018]  ceFAZolin (ANCEF) IV     diphenhydrAMINE-zinc acetate, HYDROmorphone, ondansetron (ZOFRAN) IV, promethazine, zolpidem  Iron/TIBC/Ferritin/ %Sat    Component Value Date/Time   IRON 42 09/23/2018 2113   TIBC 291 09/23/2018 2113   FERRITIN 573 (H) 09/23/2018 2113   IRONPCTSAT 14 09/23/2018 2113    Exam: Gen alert in no distress No jvd or bruits Chest no sig rales or wheezing, occ rhonchi, coughing RRR no w/ 2/6 holo syst M, no RG Abd soft ntnd no mass or ascites +bs Ext  No LE edema Neuro is alert, Ox 3 , nf R IJ TDC,  LUA avf wounds healed, no bruit    Home meds:  - amlodipine 5 hs  - insulin lantus 5 hs/ insulin novolog 5u tid ac  - MVI/ vit D w HD  Previously dialyzed at [place] (these are old orders from late 2019)   4hr   59kg  TDC   Hep none  ( LUA AVF failed in Jan 2020); at time of dc was lowered to 3.5h and low dose heparin  Assessment/ Plan 1. Uremia/ ESRD : does not have an OP HD unit. Discharged from [place] due to noncompliance late last year. Resuming CLIP process here. If will not be accepted by [facility] will need to investigate other groups/ facilities in the area. 2. Nausea/ vomiting: resolved w/ HD 3. HD access: has of failed AVF x 2.  VVS has seen, planning central venogram and new perm access this week.  4. 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 Secondary  hyperparathyroidism: CorrCa/Phos ok 6. HTN/volume: BP's better w/ vol removal. BP's good today.  7. Nutrition: Alb2.3, better than last admit 8. 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 Hep C: Active infection. 10. Hx of ANCA GN and BOOP: mid 2019, treated w/ immunosuppression 2-3 months. No recurrences.     [facility] 10/26/2018, 10:34 AM  Recent Labs  Lab 10/24/18 0300 10/25/18 0235  NA 134* 135  K 4.3 3.8  CL 106 99  CO2 20* 25  GLUCOSE 111* 82  BUN 54* 18  CREATININE 8.82* 4.77*  CALCIUM 7.6* 8.1*  PHOS 5.4* 4.8*  ALBUMIN 2.2* 2.2*   Recent Labs  Lab 10/23/18 0657  AST 30  ALT 20  ALKPHOS 86  BILITOT 0.6  PROT 6.6   Recent Labs  Lab 10/23/18 0657 10/24/18 0300  WBC 10.4 9.0  HGB 9.6* 8.6*  HCT 30.9* 26.4*  MCV 74.5* 71.4*  PLT 250 243

## 2018-10-27 ENCOUNTER — Inpatient Hospital Stay (HOSPITAL_COMMUNITY): Payer: Medicare Other | Admitting: Certified Registered Nurse Anesthetist

## 2018-10-27 ENCOUNTER — Encounter (HOSPITAL_COMMUNITY): Payer: Self-pay | Admitting: Anesthesiology

## 2018-10-27 ENCOUNTER — Encounter (HOSPITAL_COMMUNITY)
Admission: EM | Disposition: A | Discharge status: Home / Self Care | Payer: Self-pay | Source: Home / Self Care | Attending: Internal Medicine

## 2018-10-27 DIAGNOSIS — N185 Chronic kidney disease, stage 5: Secondary | ICD-10-CM

## 2018-10-27 HISTORY — PX: AV FISTULA PLACEMENT: SHX1204

## 2018-10-27 HISTORY — PX: VENOGRAM: SHX5497

## 2018-10-27 LAB — GLUCOSE, CAPILLARY
Glucose-Capillary: 84 mg/dL (ref 70–99)
Glucose-Capillary: 110 mg/dL — ABNORMAL HIGH (ref 70–99)
Glucose-Capillary: 152 mg/dL — ABNORMAL HIGH (ref 70–99)
Glucose-Capillary: 246 mg/dL — ABNORMAL HIGH (ref 70–99)

## 2018-10-27 LAB — BASIC METABOLIC PANEL
Anion gap: 9 (ref 5–15)
BUN: 42 mg/dL — ABNORMAL HIGH (ref 8–23)
CO2: 23 mmol/L (ref 22–32)
Calcium: 8 mg/dL — ABNORMAL LOW (ref 8.9–10.3)
Chloride: 101 mmol/L (ref 98–111)
Creatinine, Ser: 8.6 mg/dL — ABNORMAL HIGH (ref 0.44–1.00)
GFR calc Af Amer: 5 mL/min — ABNORMAL LOW (ref >60)
GFR calc non Af Amer: 4 mL/min — ABNORMAL LOW (ref >60)
Glucose, Bld: 86 mg/dL (ref 70–99)
Potassium: 4.5 mmol/L (ref 3.5–5.1)
SODIUM: 133 mmol/L — AB (ref 135–145)

## 2018-10-27 LAB — CBC
HCT: 25 % — ABNORMAL LOW (ref 36.0–46.0)
Hemoglobin: 7.9 g/dL — ABNORMAL LOW (ref 12.0–15.0)
MCH: 23 pg — ABNORMAL LOW (ref 26.0–34.0)
MCHC: 31.6 g/dL (ref 30.0–36.0)
MCV: 72.9 fL — ABNORMAL LOW (ref 80.0–100.0)
NRBC: 0 % (ref 0.0–0.2)
Platelets: 287 10*3/uL (ref 150–400)
RBC: 3.43 MIL/uL — AB (ref 3.87–5.11)
RDW: 21.5 % — ABNORMAL HIGH (ref 11.5–15.5)
WBC: 7.3 10*3/uL (ref 4.0–10.5)

## 2018-10-27 LAB — PROTIME-INR
INR: 0.94
Prothrombin Time: 12.5 seconds (ref 11.4–15.2)

## 2018-10-27 SURGERY — VENOGRAM
Anesthesia: General | Laterality: Left

## 2018-10-27 MED ORDER — LACTATED RINGERS IV SOLN: INTRAVENOUS | Status: DC

## 2018-10-27 MED ORDER — SODIUM CHLORIDE 0.9 % IV SOLN: INTRAVENOUS | Status: DC

## 2018-10-27 MED ORDER — FENTANYL CITRATE (PF) 100 MCG/2ML IJ SOLN
25.0000 ug | INTRAMUSCULAR | Status: DC | PRN
  Administered 2018-10-27: 50 ug via INTRAVENOUS

## 2018-10-27 MED ORDER — PROMETHAZINE HCL 25 MG/ML IJ SOLN: 6.2500 mg | INTRAMUSCULAR | Status: DC | PRN

## 2018-10-27 MED ORDER — ONDANSETRON HCL 4 MG/2ML IJ SOLN
INTRAMUSCULAR | Status: DC | PRN
  Administered 2018-10-27: 4 mg via INTRAVENOUS

## 2018-10-27 MED ORDER — FENTANYL CITRATE (PF) 100 MCG/2ML IJ SOLN
INTRAMUSCULAR | Status: DC | PRN
  Administered 2018-10-27: 25 ug via INTRAVENOUS
  Administered 2018-10-27: 50 ug via INTRAVENOUS
  Administered 2018-10-27 (×2): 25 ug via INTRAVENOUS

## 2018-10-27 MED ORDER — ACETAMINOPHEN 160 MG/5ML PO SOLN: 325.0000 mg | Freq: Once | ORAL | Status: DC

## 2018-10-27 MED ORDER — LIDOCAINE 2% (20 MG/ML) 5 ML SYRINGE
INTRAMUSCULAR | Status: DC | PRN
  Administered 2018-10-27: 60 mg via INTRAVENOUS

## 2018-10-27 MED ORDER — SODIUM CHLORIDE 0.9 % IV SOLN
INTRAVENOUS | Status: AC
  Filled 2018-10-27: qty 1.2

## 2018-10-27 MED ORDER — IODIXANOL 320 MG/ML IV SOLN
INTRAVENOUS | Status: DC | PRN
  Administered 2018-10-27: 25 mL via INTRAVENOUS

## 2018-10-27 MED ORDER — DEXAMETHASONE SODIUM PHOSPHATE 10 MG/ML IJ SOLN
INTRAMUSCULAR | Status: DC | PRN
  Administered 2018-10-27: 4 mg via INTRAVENOUS

## 2018-10-27 MED ORDER — ACETAMINOPHEN 10 MG/ML IV SOLN: 1000.0000 mg | Freq: Once | INTRAVENOUS | Status: DC | PRN

## 2018-10-27 MED ORDER — DARBEPOETIN ALFA 60 MCG/0.3ML IJ SOSY
60.0000 ug | PREFILLED_SYRINGE | INTRAMUSCULAR | Status: DC
  Filled 2018-10-27: qty 0.3

## 2018-10-27 MED ORDER — FENTANYL CITRATE (PF) 100 MCG/2ML IJ SOLN
INTRAMUSCULAR | Status: AC
  Administered 2018-10-27: 15:00:00
  Filled 2018-10-27: qty 2

## 2018-10-27 MED ORDER — HYDROMORPHONE HCL 1 MG/ML IJ SOLN
1.0000 mg | INTRAMUSCULAR | Status: AC
  Administered 2018-10-27: 1 mg via INTRAVENOUS
  Filled 2018-10-27: qty 1

## 2018-10-27 MED ORDER — 0.9 % SODIUM CHLORIDE (POUR BTL) OPTIME
TOPICAL | Status: DC | PRN
  Administered 2018-10-27: 1000 mL

## 2018-10-27 MED ORDER — CAMPHOR-MENTHOL 0.5-0.5 % EX LOTN
TOPICAL_LOTION | CUTANEOUS | Status: DC | PRN
  Filled 2018-10-27: qty 222

## 2018-10-27 MED ORDER — FENTANYL CITRATE (PF) 250 MCG/5ML IJ SOLN
INTRAMUSCULAR | Status: AC
  Filled 2018-10-27: qty 5

## 2018-10-27 MED ORDER — HEPARIN SODIUM (PORCINE) 1000 UNIT/ML IJ SOLN
INTRAMUSCULAR | Status: DC | PRN
  Administered 2018-10-27: 3000 [IU] via INTRAVENOUS

## 2018-10-27 MED ORDER — MIDAZOLAM HCL 5 MG/5ML IJ SOLN
INTRAMUSCULAR | Status: DC | PRN
  Administered 2018-10-27: 2 mg via INTRAVENOUS

## 2018-10-27 MED ORDER — DIPHENHYDRAMINE HCL 25 MG PO CAPS
25.0000 mg | ORAL_CAPSULE | Freq: Once | ORAL | Status: AC
  Administered 2018-10-27: 25 mg via ORAL
  Filled 2018-10-27: qty 1

## 2018-10-27 MED ORDER — SODIUM CHLORIDE 0.9 % IV SOLN
INTRAVENOUS | Status: DC | PRN
  Administered 2018-10-27: 500 mL

## 2018-10-27 MED ORDER — DIPHENHYDRAMINE HCL 12.5 MG/5ML PO ELIX
6.2500 mg | ORAL_SOLUTION | Freq: Every day | ORAL | Status: DC | PRN
  Administered 2018-10-28: 6.25 mg via ORAL
  Filled 2018-10-27 (×2): qty 5

## 2018-10-27 MED ORDER — ACETAMINOPHEN 325 MG PO TABS
325.0000 mg | ORAL_TABLET | Freq: Once | ORAL | Status: DC
  Administered 2018-10-27: 650 mg via ORAL

## 2018-10-27 MED ORDER — PROPOFOL 10 MG/ML IV BOLUS
INTRAVENOUS | Status: DC | PRN
  Administered 2018-10-27: 30 mg via INTRAVENOUS
  Administered 2018-10-27: 130 mg via INTRAVENOUS

## 2018-10-27 MED ORDER — MEPERIDINE HCL 50 MG/ML IJ SOLN: 6.2500 mg | INTRAMUSCULAR | Status: DC | PRN

## 2018-10-27 MED ORDER — SODIUM CHLORIDE 0.9 % IV SOLN
INTRAVENOUS | Status: DC | PRN
  Administered 2018-10-27 (×2): via INTRAVENOUS

## 2018-10-27 MED ORDER — LIDOCAINE HCL (PF) 1 % IJ SOLN
INTRAMUSCULAR | Status: AC
  Filled 2018-10-27: qty 30

## 2018-10-27 MED ORDER — HEMOSTATIC AGENTS (NO CHARGE) OPTIME
TOPICAL | Status: DC | PRN
  Administered 2018-10-27: 1 via TOPICAL

## 2018-10-27 MED ORDER — MIDAZOLAM HCL 2 MG/2ML IJ SOLN
INTRAMUSCULAR | Status: AC
  Filled 2018-10-27: qty 2

## 2018-10-27 SURGICAL SUPPLY — 55 items
ADH SKN CLS APL DERMABOND .7 (GAUZE/BANDAGES/DRESSINGS) ×2
AGENT HMST SPONGE THK3/8 (HEMOSTASIS)
APPLICATOR CHLORAPREP 3ML ORNG (MISCELLANEOUS) ×2 IMPLANT
ARMBAND PINK RESTRICT EXTREMIT (MISCELLANEOUS) ×4 IMPLANT
BAG BANDED W/RUBBER/TAPE 36X54 (MISCELLANEOUS) ×3 IMPLANT
BAG EQP BAND 135X91 W/RBR TAPE (MISCELLANEOUS) ×1
BAG SNAP BAND KOVER 36X36 (MISCELLANEOUS) ×3 IMPLANT
CANISTER SUCT 3000ML PPV (MISCELLANEOUS) ×3 IMPLANT
CHLORAPREP W/TINT 26ML (MISCELLANEOUS) ×1 IMPLANT
CLIP VESOCCLUDE MED 6/CT (CLIP) ×3 IMPLANT
CLIP VESOCCLUDE SM WIDE 6/CT (CLIP) ×3 IMPLANT
COVER BACK TABLE 60X90IN (DRAPES) ×1 IMPLANT
COVER DOME SNAP 22 D (MISCELLANEOUS) ×3 IMPLANT
COVER PROBE W GEL 5X96 (DRAPES) ×3 IMPLANT
COVER WAND RF STERILE (DRAPES) ×3 IMPLANT
DECANTER SPIKE VIAL GLASS SM (MISCELLANEOUS) ×1 IMPLANT
DERMABOND ADVANCED (GAUZE/BANDAGES/DRESSINGS) ×4
DERMABOND ADVANCED .7 DNX12 (GAUZE/BANDAGES/DRESSINGS) ×1 IMPLANT
DRAPE BRACHIAL (DRAPES) ×2 IMPLANT
DRAPE FEMORAL ANGIO 80X135IN (DRAPES) ×1 IMPLANT
ELECT REM PT RETURN 9FT ADLT (ELECTROSURGICAL) ×3
ELECTRODE REM PT RTRN 9FT ADLT (ELECTROSURGICAL) ×1 IMPLANT
GLOVE BIO SURGEON STRL SZ7.5 (GLOVE) ×3 IMPLANT
GLOVE BIOGEL PI IND STRL 8 (GLOVE) ×1 IMPLANT
GLOVE BIOGEL PI INDICATOR 8 (GLOVE) ×2
GOWN STRL REUS W/ TWL LRG LVL3 (GOWN DISPOSABLE) ×3 IMPLANT
GOWN STRL REUS W/ TWL XL LVL3 (GOWN DISPOSABLE) ×2 IMPLANT
GOWN STRL REUS W/TWL LRG LVL3 (GOWN DISPOSABLE) ×9
GOWN STRL REUS W/TWL XL LVL3 (GOWN DISPOSABLE) ×6
GRAFT GORETEX STRT 4-7X45 (Vascular Products) ×2 IMPLANT
HEMOSTAT SNOW SURGICEL 2X4 (HEMOSTASIS) ×2 IMPLANT
HEMOSTAT SPONGE AVITENE ULTRA (HEMOSTASIS) IMPLANT
KIT BASIN OR (CUSTOM PROCEDURE TRAY) ×3 IMPLANT
KIT TURNOVER KIT B (KITS) ×3 IMPLANT
NS IRRIG 1000ML POUR BTL (IV SOLUTION) ×3 IMPLANT
PACK CV ACCESS (CUSTOM PROCEDURE TRAY) ×3 IMPLANT
PAD ARMBOARD 7.5X6 YLW CONV (MISCELLANEOUS) ×6 IMPLANT
PROTECTION STATION PRESSURIZED (MISCELLANEOUS)
SET MICROPUNCTURE 5F STIFF (MISCELLANEOUS) IMPLANT
SHEATH PINNACLE 5F 10CM (SHEATH) ×1 IMPLANT
STATION PROTECTION PRESSURIZED (MISCELLANEOUS) ×1 IMPLANT
STOPCOCK MORSE 400PSI 3WAY (MISCELLANEOUS) ×1 IMPLANT
SUT GORETEX 6.0 TT13 (SUTURE) IMPLANT
SUT MNCRL AB 4-0 PS2 18 (SUTURE) ×5 IMPLANT
SUT PROLENE 6 0 BV (SUTURE) ×7 IMPLANT
SUT PROLENE 7 0 BV 1 (SUTURE) ×2 IMPLANT
SUT SILK 2 0 PERMA HAND 18 BK (SUTURE) IMPLANT
SUT VIC AB 3-0 SH 27 (SUTURE) ×6
SUT VIC AB 3-0 SH 27X BRD (SUTURE) ×2 IMPLANT
SYR 10ML LL (SYRINGE) ×4 IMPLANT
TOWEL GREEN STERILE (TOWEL DISPOSABLE) ×3 IMPLANT
TRAY FOLEY MTR SLVR 16FR STAT (SET/KITS/TRAYS/PACK) IMPLANT
TUBING CIL FLEX 10 FLL-RA (TUBING) ×1 IMPLANT
UNDERPAD 30X30 (UNDERPADS AND DIAPERS) ×3 IMPLANT
WATER STERILE IRR 1000ML POUR (IV SOLUTION) ×3 IMPLANT

## 2018-10-27 NOTE — Op Note (Signed)
Date: October 27, 2018  Preoperative diagnosis: End-stage renal disease  Postoperative diagnosis: Same  Procedure: 1.  Left upper extremity venogram 2.  Left upper arm AV graft placement (4 x 7 mm tapered stretch gore-tex graft)  Surgeon: Dr. Marty Heck, MD  Assistant: OR staff  Indications: Patient is a 63 year old female who vascular surgery was consulted for permanent dialysis access.  She has undergone previous failed attempts at a left brachiocephalic fistula as well as a brachiobasilic.  As a result we have recommended a venogram to ensure there is no outflow stenosis and/or occlusion with subsequent placement of a left upper arm graft given that she has no more usable superficial vein in the left arm.  She has a RIJ tunneled catheter that has been present for a very long time.  She presents today after risks and benefits were discussed.  Findings: Venogram demonstrated one of the two brachial veins near the axilla was occluded while the other remains patent and you could see filling of the axillary and subclavian veins.  Unfortunately the patient was breathing with an LMA and we were injecting contrast through a IV at the wrist and even with full contrast we could not get a good image centrally to definitively identify if there was a central occlusion and/or stenosis.  As a result, after evaluating the images to the best of her ability we went ahead with left upper arm graft placement to the brachial vein that was patent.  Anesthesia: LMA  Details: The patient was taken to the operating room after informed consent was obtained.  She was placed on operative table in supine position.  A prep timeout was performed to identify patient, procedure and site.  She had a small IV in her left wrist that was placed in preoperative holding.  We used this to initially inject half-strength contrast to evaluate her upper arm veins as well as her central veins with a venogram.  Ultimately it  appeared that one of her brachial veins was occluded with the second brachial vein draining into the axillary and subclavian vein.  Given that the patient was breathing spontaneously with an LMA we could not do any breath-hold's and we tried using full-strength contrast but could never get good imaging centrally.  Based on what we could tell, my plan was for an upper arm graft sewn to the patent brachial and/or axillary vein. At that point in 0time the left arm was prepped and draped in standard sterile fashion.  Used ultrasound to identify her brachial artery above the antecubitum and above her previous incisions where the brachial artery was marked in longitudinal fashion.  I then went up to the axilla and identified the patent brachial vein that was identified on venogram.  Ultimately longitudinal incision was made above the antecubitum and a second incision up by the axilla.  Through these incisions we used Bovie cautery and blunt dissection and initially dissected out the brachial artery and it was controlled proximally and distally with Vesseloops.  Then went up in the axilla and found the old brachiobasilic fistula that had been transposed and follow this to the patent brachial vein and this was dissected up into the axilla and the axillary vein was identified.  At that point in time a curved tunneler was used to tunnel from the antecubital incision to the axillary incision on top of the fascia.  The patient was then given 3000 units of IV heparin.  A 4 x 7 mm Gore-Tex tract graft  was brought on the field placed through the tunneler and the tunneler was removed.  The graft was then spatulated distally at the brachial artery end.  We pulled up on the Vesseloops and the artery was opened in longitudinal fashion with the 11 blade scalpel and extended with Potts scissors.  An end to side anastomosis was then sewn with the 4 mm end of the graft on the brachial artery with a running 6-0 Prolene. The graft was  flushed and had excellent pulsatile bleeding.  I then fill the graft with heparinized saline in retrograde fashion and clamped it just past the arterial anastomosis.  I then placed some Surgicel snow around the anastomosis for hemostasis.  At that point in time the graft was then pulled to length and then trimmed and spatulated up in the axilla.  I then placed a baby Gregory clamp up into the axilla and clamped the axillary vein and found the one branch of the brachial vein that was patent.  I then used a 2-0 silk suture and ligated and then transected the brachial vein.  The brachial vein had good backbleeding and it flushed easily and it was about 6-7 mm in diameter.  I then sewed the 7 mm end of graft end-to-end with a running 6-0 Prolene to the patent deep brachial vein almost at the axillary vein.  We came off our clamps and there was excellent thrill in the graft.  Unfortunately it looked like the arterial end of the graft was a little bit too redundant so I ultimately pulled on the axillary end of the graft to try and reduce some of the redundancy and then we had what I thought was too much redundancy.  I then clamped proximally and distally and then transected and removed a piece of graft that was about 5 to 8 cm in length through the axillary incision.  I then sewed the graft back together in end-to-end fashion with a running 6-0 Prolene.  When I came back off the clamps patient had an excellent thrill in the graft.  All incisions were irrigated with saline until all the effluent was clear.  Subcutaneous tissue was closed with running 3-0 Vicryl skin was closed with 4-0 Monocryl and Dermabond was applied.  Taken to PACU in stable condition.  Condition: Stable  Marty Heck, MD [facility] Office: [phone] Pager: [place]

## 2018-10-27 NOTE — Progress Notes (Signed)
Surgical site to left upper arm remains clean, dry, and intact (surgical glue). Pain management maintained with IV Dilaudid. Alert and oriented  X 4. Call bell within reach. Encouraged to report signs and symptoms to staff.

## 2018-10-27 NOTE — Anesthesia Postprocedure Evaluation (Signed)
Anesthesia Post Note  Patient: Rhonda Lynch  Procedure(s) Performed: LEFT CENTRAL VENOGRAM (Left ) INSERTION OF ARTERIOVENOUS (AV) GORE-TEX GRAFT UPPER ARM (Left )     Patient location during evaluation: PACU Anesthesia Type: General Level of consciousness: awake and alert Pain management: pain level controlled Vital Signs Assessment: post-procedure vital signs reviewed and stable Respiratory status: spontaneous breathing, nonlabored ventilation, respiratory function stable and patient connected to nasal cannula oxygen Cardiovascular status: blood pressure returned to baseline and stable Postop Assessment: no apparent nausea or vomiting Anesthetic complications: no    Last Vitals:  Vitals:   10/27/18 1245 10/27/18 1300  BP: 132/85 127/81  Pulse: (!) 103 100  Resp: 20 14  Temp:    SpO2: 100% 97%                  Effie Berkshire

## 2018-10-27 NOTE — Progress Notes (Signed)
  Date: 10/27/2018  Patient name: Rhonda Lynch  Medical record number: [id number]  Date of birth: May 29, 1956   I have seen and evaluated this patient and I have discussed the plan of care with the house staff. Please see their note for complete details.  Lenice Pressman, M.D., Ph.D. 10/27/2018, 12:34 PM

## 2018-10-27 NOTE — Progress Notes (Signed)
HD treatment has been modified to 10/28/2018, by Dr Jonnie Finner.  Notified Chaney Malling RN at [phone]  of above

## 2018-10-27 NOTE — Progress Notes (Signed)
   Subjective: No complaints today. Received benadryl capsule overnight for generalized itching. Discussed plan for AV graft today.   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   ESRD on HD: historically noncompliant with HD and frequent changes in goals of care. At this time, she would like to continue with HD and pursue permanent access and outpt dialysis center - HD per nephrology - vascular surgery consult for permanent HD access. She's had two failed AVFs. Planning for AVG today.   DM2: home regiment includes lantus 5U qhs, novolog 5U tid wc - holding home meds in the setting of hypoglycemia on admission - CBGs 84-197   HTN: continue home amlodipine  Untreated hep C: LFTs normal, no focal abnormality on CT. Will need outpt f/u in hep C clinic  Dispo: Anticipated discharge unclear. Pending permanent dialysis access and CLIP process  Rhonda Course, MD 10/27/2018, 9:53 AM Pager:  [phone]

## 2018-10-27 NOTE — Anesthesia Preprocedure Evaluation (Addendum)
Anesthesia Evaluation  Patient identified by MRN, date of birth, ID band Patient awake    Reviewed: Allergy & Precautions, NPO status , Patient's Chart, lab work & pertinent test results  Airway Mallampati: II  TM Distance: >3 FB Neck ROM: Full    Dental  (+) Poor Dentition, Missing, Dental Advisory Given   Pulmonary Current Smoker,    Pulmonary exam normal        Cardiovascular hypertension, Pt. on medications  Rhythm:Regular Rate:Normal     Neuro/Psych CVA    GI/Hepatic negative GI ROS, (+) Hepatitis -, C  Endo/Other  diabetes, Type 2, Insulin Dependent  Renal/GU ESRFRenal disease     Musculoskeletal negative musculoskeletal ROS (+)   Abdominal Normal abdominal exam  (+)   Peds  Hematology negative hematology ROS (+) anemia ,   Anesthesia Other Findings   Reproductive/Obstetrics                           Lab Results  Component Value Date   WBC 7.3 10/27/2018   HGB 7.9 (L) 10/27/2018   HCT 25.0 (L) 10/27/2018   MCV 72.9 (L) 10/27/2018   PLT 287 10/27/2018   Lab Results  Component Value Date   CREATININE 8.60 (H) 10/27/2018   BUN 42 (H) 10/27/2018   NA 133 (L) 10/27/2018   K 4.5 10/27/2018   CL 101 10/27/2018   CO2 23 10/27/2018   Lab Results  Component Value Date   INR 0.94 10/27/2018   INR 0.96 09/29/2018   INR 0.97 07/23/2018   EKG: normal sinus rhythm.  Echo: - Left ventricle: Systolic function was mildly to moderately   reduced. The estimated ejection fraction was in the range of 40%   to 45%. Mild diffuse hypokinesis. - Aortic valve: No evidence of vegetation. - Mitral valve: No evidence of vegetation. There was mild   regurgitation. - Left atrium: The atrium was mildly to moderately dilated. No   evidence of thrombus in the atrial cavity or appendage. - Right atrium: The atrium was mildly to moderately dilated. No   evidence of thrombus in the atrial cavity or  appendage. - Atrial septum: No defect or patent foramen ovale was identified. - Tricuspid valve: The posterior leaflet of the TV appears   thickened but no obvious mobile density. There is moderate   eccentric TR directed towards the interatrial septum. Suspicious   for possible TV vegetation. - Pulmonic valve: No evidence of vegetation.  Anesthesia Physical Anesthesia Plan  ASA: III  Anesthesia Plan: General   Post-op Pain Management:    Induction: Intravenous  PONV Risk Score and Plan: 3 and Ondansetron, Dexamethasone and Midazolam  Airway Management Planned: LMA  Additional Equipment: None  Intra-op Plan:   Post-operative Plan: Extubation in OR  Informed Consent: I have reviewed the patients History and Physical, chart, labs and discussed the procedure including the risks, benefits and alternatives for the proposed anesthesia with the patient or authorized representative who has indicated his/her understanding and acceptance.     Dental advisory given  Plan Discussed with: CRNA, Surgeon and Anesthesiologist  Anesthesia Plan Comments:        Anesthesia Quick Evaluation

## 2018-10-27 NOTE — Progress Notes (Addendum)
[facility] Progress Note  Subjective: no c/o's today.   Vitals:   10/27/18 0830 10/27/18 1230 10/27/18 1245 10/27/18 1300  BP: (!) 143/82 (!) 136/92 132/85 127/81  Pulse: 85 (!) 105 (!) 103 100  Resp: 16 20 20 14   Temp: 98.3 F (36.8 C) (!) 97.5 F (36.4 C)    TempSrc: Oral     SpO2: 97% 100% 100% 97%  Weight:        Inpatient medications: . amLODipine  5 mg Oral QHS  . calcitRIOL  0.25 mcg Oral Q M,W,F-HD  . Chlorhexidine Gluconate Cloth  6 each Topical Q0600  . heparin  5,000 Units Subcutaneous Q8H  . multivitamin  1 tablet Oral QHS    camphor-menthol, diphenhydrAMINE, diphenhydrAMINE-zinc acetate, HYDROmorphone, ondansetron (ZOFRAN) IV, promethazine, zolpidem  Iron/TIBC/Ferritin/ %Sat    Component Value Date/Time   IRON 42 09/23/2018 2113   TIBC 291 09/23/2018 2113   FERRITIN 573 (H) 09/23/2018 2113   IRONPCTSAT 14 09/23/2018 2113    Exam: Gen alert in no distress No jvd or bruits Chest no sig rales or wheezing, occ rhonchi, coughing RRR no w/ 2/6 holo syst M, no RG Abd soft ntnd no mass or ascites +bs Ext  No LE edema Neuro is alert, Ox 3 , nf R IJ TDC,  LUA avf wounds healed, no bruit    Home meds:  - amlodipine 5 hs  - insulin lantus 5 hs/ insulin novolog 5u tid ac  - MVI/ vit D w HD  Previously dialyzed at [facility] (these are old orders from late 2019)   4hr   59kg  TDC   Hep none  ( LUA AVF failed in Jan 2020); at time of dc was lowered to 3.5h and low dose heparin  Assessment/ Plan 1. ESRD : n/v due to uremia, resolved w/ HD here. Looking to see if we can find her an outpatient HD unit.   2. HD access: has of failed AVF x 2, has new AVG L arm placed today 2/17.  3. Anemia of CKDwith hx Fe deficiency: Hb 7.9, will start esa and check Fe stores. 4. Secondary hyperparathyroidism: CorrCa/Phos ok 5. HTN/volume: BP's better 6. Nutrition: Alb2.3, better than last admit 7. 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 Hep C: Active infection. 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 No recurrences.     [facility] 10/27/2018, 3:54 PM  Recent Labs  Lab 10/24/18 0300 10/25/18 0235 10/27/18 0311  NA 134* 135 133*  K 4.3 3.8 4.5  CL 106 99 101  CO2 20* 25 23  GLUCOSE 111* 82 86  BUN 54* 18 42*  CREATININE 8.82* 4.77* 8.60*  CALCIUM 7.6* 8.1* 8.0*  PHOS 5.4* 4.8*  --   ALBUMIN 2.2* 2.2*  --   INR  --   --  0.94   Recent Labs  Lab 10/23/18 0657  AST 30  ALT 20  ALKPHOS 86  BILITOT 0.6  PROT 6.6   Recent Labs  Lab 10/24/18 0300 10/27/18 0311  WBC 9.0 7.3  HGB 8.6* 7.9*  HCT 26.4* 25.0*  MCV 71.4* 72.9*  PLT 243 287

## 2018-10-27 NOTE — Progress Notes (Signed)
RN verified the presence of a signed informed consent that matches stated procedure by patient. Verified armband matches patient's stated name and birth date. Verified NPO status and that all jewelry, contact, glasses, dentures, and partials had been removed (if applicable).  

## 2018-10-27 NOTE — Transfer of Care (Signed)
Immediate Anesthesia Transfer of Care Note  Patient: Rhonda Lynch  Procedure(s) Performed: LEFT CENTRAL VENOGRAM (Left ) INSERTION OF ARTERIOVENOUS (AV) GORE-TEX GRAFT UPPER ARM (Left )  Patient Location: PACU  Anesthesia Type:General  Level of Consciousness: awake, alert , oriented and patient cooperative  Airway & Oxygen Therapy: Patient Spontanous Breathing and Patient connected to nasal cannula oxygen  Post-op Assessment: Report given to RN, Post -op Vital signs reviewed and stable and Patient moving all extremities X 4  Post vital signs: Reviewed and stable  Last Vitals:  Vitals Value Taken Time  BP 136/92 10/27/2018 12:31 PM  Temp 36.4 C 10/27/2018 12:30 PM  Pulse 103 10/27/2018 12:33 PM  Resp 18 10/27/2018 12:33 PM  SpO2 100 % 10/27/2018 12:33 PM  Vitals shown include unvalidated device data.  Last Pain:  Vitals:   10/27/18 0830  TempSrc: Oral  PainSc:       Patients Stated Pain Goal: 2 (41/42/39 5320)  Complications: No apparent anesthesia complications

## 2018-10-27 NOTE — Progress Notes (Signed)
[facility]  Subjective  - No complaints.  Using RIJ TDC.   Objective (!) 143/82 85 98.3 F (36.8 C) (Oral) 16 97%  Intake/Output Summary (Last 24 hours) at 10/27/2018 0958 Last data filed at 10/26/2018 1200 Gross per 24 hour  Intake 120 ml  Output -  Net 120 ml    Palpable left radial and brachial pulse RIJ tunneled catheter  Laboratory Lab Results: Recent Labs    10/27/18 0311  WBC 7.3  HGB 7.9*  HCT 25.0*  PLT 287   BMET Recent Labs    10/25/18 0235 10/27/18 0311  NA 135 133*  K 3.8 4.5  CL 99 101  CO2 25 23  GLUCOSE 82 86  BUN 18 42*  CREATININE 4.77* 8.60*  CALCIUM 8.1* 8.0*    COAG Lab Results  Component Value Date   INR 0.94 10/27/2018   INR 0.96 09/29/2018   INR 0.97 07/23/2018   No results found for: PTT  Assessment/Planning:  Plan for left arm venogram to r/o central stenosis/occlusion given previous failed fistulas.  Left arm AVG.  No usable superficial vein.  Rhonda Lynch 10/27/2018 9:58 AM --  Rhonda Heck, MD [facility] [place] Office: [phone] Pager: [place]

## 2018-10-27 NOTE — Anesthesia Procedure Notes (Signed)
Procedure Name: LMA Insertion Date/Time: 10/27/2018 10:17 AM Performed by: Carney Living, CRNA Pre-anesthesia Checklist: Patient identified, Emergency Drugs available, Suction available, Patient being monitored and Timeout performed Patient Re-evaluated:Patient Re-evaluated prior to induction Oxygen Delivery Method: Circle system utilized Preoxygenation: Pre-oxygenation with 100% oxygen Induction Type: IV induction LMA: LMA inserted LMA Size: 4.0 Number of attempts: 1 Placement Confirmation: positive ETCO2 and breath sounds checked- equal and bilateral Tube secured with: Tape Dental Injury: Teeth and Oropharynx as per pre-operative assessment

## 2018-10-28 ENCOUNTER — Other Ambulatory Visit: Payer: Self-pay

## 2018-10-28 ENCOUNTER — Encounter: Payer: Self-pay | Admitting: Vascular Surgery

## 2018-10-28 ENCOUNTER — Encounter (HOSPITAL_COMMUNITY): Payer: Self-pay | Admitting: Vascular Surgery

## 2018-10-28 LAB — RENAL FUNCTION PANEL
ALBUMIN: 2.4 g/dL — AB (ref 3.5–5.0)
Anion gap: 14 (ref 5–15)
BUN: 54 mg/dL — ABNORMAL HIGH (ref 8–23)
CO2: 22 mmol/L (ref 22–32)
Calcium: 7.9 mg/dL — ABNORMAL LOW (ref 8.9–10.3)
Chloride: 97 mmol/L — ABNORMAL LOW (ref 98–111)
Creatinine, Ser: 10.18 mg/dL — ABNORMAL HIGH (ref 0.44–1.00)
GFR calc Af Amer: 4 mL/min — ABNORMAL LOW (ref >60)
GFR, EST NON AFRICAN AMERICAN: 4 mL/min — AB (ref >60)
Glucose, Bld: 160 mg/dL — ABNORMAL HIGH (ref 70–99)
Phosphorus: 6.1 mg/dL — ABNORMAL HIGH (ref 2.5–4.6)
Potassium: 5 mmol/L (ref 3.5–5.1)
Sodium: 133 mmol/L — ABNORMAL LOW (ref 135–145)

## 2018-10-28 LAB — CBC
HCT: 23.7 % — ABNORMAL LOW (ref 36.0–46.0)
Hemoglobin: 7.3 g/dL — ABNORMAL LOW (ref 12.0–15.0)
MCH: 22.9 pg — ABNORMAL LOW (ref 26.0–34.0)
MCHC: 30.8 g/dL (ref 30.0–36.0)
MCV: 74.3 fL — ABNORMAL LOW (ref 80.0–100.0)
Platelets: 292 10*3/uL (ref 150–400)
RBC: 3.19 MIL/uL — ABNORMAL LOW (ref 3.87–5.11)
RDW: 21.8 % — ABNORMAL HIGH (ref 11.5–15.5)
WBC: 9 10*3/uL (ref 4.0–10.5)
nRBC: 0 % (ref 0.0–0.2)

## 2018-10-28 LAB — IRON AND TIBC
Iron: 112 ug/dL (ref 28–170)
Saturation Ratios: 48 % — ABNORMAL HIGH (ref 10.4–31.8)
TIBC: 231 ug/dL — ABNORMAL LOW (ref 250–450)
UIBC: 119 ug/dL

## 2018-10-28 LAB — GLUCOSE, CAPILLARY
GLUCOSE-CAPILLARY: 139 mg/dL — AB (ref 70–99)
Glucose-Capillary: 116 mg/dL — ABNORMAL HIGH (ref 70–99)

## 2018-10-28 MED ORDER — DARBEPOETIN ALFA 60 MCG/0.3ML IJ SOSY
60.0000 ug | PREFILLED_SYRINGE | INTRAMUSCULAR | Status: DC
  Administered 2018-10-28: 60 ug via INTRAVENOUS

## 2018-10-28 MED ORDER — NITROGLYCERIN 0.4 MG SL SUBL
0.4000 mg | SUBLINGUAL_TABLET | SUBLINGUAL | Status: DC | PRN
  Administered 2018-10-28: 0.4 mg via SUBLINGUAL

## 2018-10-28 MED ORDER — ASPIRIN 81 MG PO CHEW
CHEWABLE_TABLET | ORAL | Status: AC
  Filled 2018-10-28: qty 3

## 2018-10-28 MED ORDER — HYDROMORPHONE HCL 1 MG/ML IJ SOLN
0.5000 mg | INTRAMUSCULAR | Status: AC
  Administered 2018-10-28: 0.5 mg via INTRAVENOUS
  Filled 2018-10-28: qty 0.5

## 2018-10-28 MED ORDER — NITROGLYCERIN 0.4 MG SL SUBL
SUBLINGUAL_TABLET | SUBLINGUAL | Status: AC
  Administered 2018-10-28: 0.4 mg via SUBLINGUAL
  Filled 2018-10-28: qty 1

## 2018-10-28 MED ORDER — HEPARIN SODIUM (PORCINE) 1000 UNIT/ML IJ SOLN
INTRAMUSCULAR | Status: AC
  Administered 2018-10-28: 3400 [IU]
  Filled 2018-10-28: qty 4

## 2018-10-28 MED ORDER — ASPIRIN 81 MG PO CHEW
CHEWABLE_TABLET | ORAL | Status: AC
  Administered 2018-10-28: 325 mg
  Filled 2018-10-28: qty 1

## 2018-10-28 MED ORDER — ASPIRIN 325 MG PO TABS
325.0000 mg | ORAL_TABLET | Freq: Once | ORAL | Status: AC
  Filled 2018-10-28: qty 1

## 2018-10-28 MED ORDER — DARBEPOETIN ALFA 60 MCG/0.3ML IJ SOSY
PREFILLED_SYRINGE | INTRAMUSCULAR | Status: AC
  Administered 2018-10-28: 60 ug via INTRAVENOUS
  Filled 2018-10-28: qty 0.3

## 2018-10-28 MED ORDER — HYDROMORPHONE HCL 2 MG PO TABS: 2.0000 mg | ORAL_TABLET | Freq: Four times a day (QID) | ORAL | Status: DC | PRN

## 2018-10-28 NOTE — Progress Notes (Signed)
   Subjective: Feeling well today. Left arm pain is tolerable. In HD, resting comfortably. Discussed possible discharge today, just waiting for outpt HD chair time/day schedule   Objective:  Vital signs in last 24 hours: Vitals:   10/28/18 0930 10/28/18 0945 10/28/18 1000 10/28/18 1030  BP: 108/74 140/85 125/75 99/79  Pulse: (!) 102 (!) 101 97 72  Resp:      Temp:      TempSrc:      SpO2:      Weight:       Gen: laying in bed, NAD  Ext: left arm AV graft without drainage or redness, some warmth. 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   ESRD on HD: historically noncompliant with HD and frequent changes in goals of care. At this time, she would like to continue with HD and pursue permanent access and outpt dialysis center - HD per nephrology - vascular surgery consult: AV graft placed 2/17, pain well controlled. Will wait 4 weeks prior to accessing graft  - CLIP pending: she has been accepted somewhere, just waiting final day/time schedule   DM2: home regiment includes lantus 5U qhs, novolog 5U tid wc - CBGs elevated up to 246 today - will start lantus 3U qhs and instruct her to schedule close outpt f/u   HTN: continue home amlodipine  Untreated hep C: LFTs normal, no focal abnormality on CT.  Will need outpt f/u in hep C clinic  Dispo: Anticipated discharge today pending final outpt HD schedule  Isabelle Course, MD 10/28/2018, 10:54 AM Pager: [phone]

## 2018-10-28 NOTE — Progress Notes (Signed)
[facility] [place]  Subjective  - Mild left arm pain.   Objective 110/69 96 98.1 F (36.7 C) (Oral) (!) 21 98%  Intake/Output Summary (Last 24 hours) at 10/28/2018 0813 Last data filed at 10/27/2018 1303 Gross per 24 hour  Intake 920 ml  Output 30 ml  Net 890 ml    Palpable thrill in left upper arm AV graft Palpable left radial pulse No left arm hematoma  Laboratory Lab Results: Recent Labs    10/27/18 0311 10/28/18 0707  WBC 7.3 9.0  HGB 7.9* 7.3*  HCT 25.0* 23.7*  PLT 287 292   BMET Recent Labs    10/27/18 0311 10/28/18 0707  NA 133* 133*  K 4.5 5.0  CL 101 97*  CO2 23 22  GLUCOSE 86 160*  BUN 42* 54*  CREATININE 8.60* 10.18*  CALCIUM 8.0* 7.9*    COAG Lab Results  Component Value Date   INR 0.94 10/27/2018   INR 0.96 09/29/2018   INR 0.97 07/23/2018   No results found for: PTT  Assessment/Planning:  Doing well POD#1 s/p left upper arm AV graft placement.  Palpable thrill in graft.  Would wait at least one month to access graft.  Call vascular with questions or concerns.  Marty Heck 10/28/2018 8:13 AM --

## 2018-10-28 NOTE — Progress Notes (Signed)
[facility] Progress Note  Subjective: no new c/o.   Vitals:   10/28/18 0900 10/28/18 0930 10/28/18 0945 10/28/18 1000  BP: 126/78 108/74 140/85 125/75  Pulse: 91 (!) 102 (!) 101 97  Resp:      Temp:      TempSrc:      SpO2:      Weight:        Inpatient medications: . amLODipine  5 mg Oral QHS  . calcitRIOL  0.25 mcg Oral Q M,W,F-HD  . Chlorhexidine Gluconate Cloth  6 each Topical Q0600  . Darbepoetin Alfa      . darbepoetin (ARANESP) injection - DIALYSIS  60 mcg Intravenous Q Tue-HD  . heparin      . heparin  5,000 Units Subcutaneous Q8H  . multivitamin  1 tablet Oral QHS    camphor-menthol, diphenhydrAMINE, diphenhydrAMINE-zinc acetate, HYDROmorphone, nitroGLYCERIN, ondansetron (ZOFRAN) IV, promethazine, zolpidem  Iron/TIBC/Ferritin/ %Sat    Component Value Date/Time   IRON 42 09/23/2018 2113   TIBC 291 09/23/2018 2113   FERRITIN 573 (H) 09/23/2018 2113   IRONPCTSAT 14 09/23/2018 2113    Exam: Gen alert in no distress No jvd or bruits Chest no sig rales or wheezing, occ rhonchi, coughing RRR no w/ 2/6 holo syst M, no RG Abd soft ntnd no mass or ascites +bs Ext  No LE edema Neuro is alert, Ox 3 , nf R IJ TDC,  LUA avf wounds healed, no bruit    Home meds:  - amlodipine 5 hs  - insulin lantus 5 hs/ insulin novolog 5u tid ac  - MVI/ vit D w HD  Old HD orders from late 2019 >>   4hr   59kg  TDC   Hep none  ( LUA AVF failed Jan 2020)  Assessment/ Plan 1. ESRD: uremia/ nausea resolved w/ HD.  Patient accepted at [facility], awaiting chair time/ day. Should be ok for dc today.  2. HD access: hx of failed AVF x 2. SP AVG L arm 10/27/18. Use in 4 wks.  3. Anemia of CKDwith hx Fe deficiency: Hb 7's.  Supposed to get darbe 60 ug today w/ HD. This will be followed at OP unit.  4. Secondary hyperparathyroidism: CorrCa/Phos ok 5. HTN: bp's stable on norvasc, no vol excess on exam 6. Nutrition: Alb2.3, better than last admit 7. Hx hemorrhagic CVA  nov 2019 8. Hep C: Active infection. 9. Hx of ANCA GN and BOOP: mid 2019, treated w/ immunosuppression 2-3 months. No recurrences.     [facility] 10/28/2018, 10:18 AM  Recent Labs  Lab 10/25/18 0235 10/27/18 0311 10/28/18 0707  NA 135 133* 133*  K 3.8 4.5 5.0  CL 99 101 97*  CO2 25 23 22   GLUCOSE 82 86 160*  BUN 18 42* 54*  CREATININE 4.77* 8.60* 10.18*  CALCIUM 8.1* 8.0* 7.9*  PHOS 4.8*  --  6.1*  ALBUMIN 2.2*  --  2.4*  INR  --  0.94  --    Recent Labs  Lab 10/23/18 0657  AST 30  ALT 20  ALKPHOS 86  BILITOT 0.6  PROT 6.6   Recent Labs  Lab 10/27/18 0311 10/28/18 0707  WBC 7.3 9.0  HGB 7.9* 7.3*  HCT 25.0* 23.7*  MCV 72.9* 74.3*  PLT 287 292

## 2018-10-28 NOTE — Plan of Care (Signed)
  Problem: Education: Goal: Knowledge of General Education information will improve Description: Including pain rating scale, medication(s)/side effects and non-pharmacologic comfort measures Outcome: Progressing   Problem: Clinical Measurements: Goal: Respiratory complications will improve Outcome: Progressing Goal: Cardiovascular complication will be avoided Outcome: Progressing   Problem: Nutrition: Goal: Adequate nutrition will be maintained Outcome: Progressing   

## 2018-10-28 NOTE — Progress Notes (Signed)
Accepted at [facility]   First treatment is :Thursday,February20.2020 at 12:15pm.Schedule and chair time EB:XIDHWYS,HUOHFGBM.Saturday at 12:15pm.2nd shift

## 2018-10-28 NOTE — Progress Notes (Signed)
Renal Quick Note:  Called by RN to assess patient with severe chest pain on HD (2 hours in - 1.3L UF off). UF turned off and 226mL fluid bolus given without relief. 12-lead EKG ordered, reviewed without any acute ST changes (per my read). Still with pain - ended up giving NTG 0.4 SL and aspirin 325 which resolved pain. Pt now resting comfortably on dialysis. Unclear if patient has had ischemic cardiac evaluation in past.  Veneta Penton, PA-C [facility] Pager [phone]

## 2018-10-28 NOTE — Progress Notes (Signed)
  Date: 10/28/2018  Patient name: Rhonda Lynch  Medical record number: [id number]  Date of birth: 10-Jun-1956   I have seen and evaluated this patient and I have discussed the plan of care with the house staff. Please see their note for complete details. I concur with their findings with the following additions/corrections:   Doing well POD #1 after left arm graft placement yesterday. Having some pain at the site that extends up into the shoulder, tender to the touch, incision looks clean and intact with expected postop swelling. Distal pulses and cap refill normal.   Nephrology team has managed to find a dialysis center that will take her, still awaiting chair time and day. Hopefully ready for discharge today.  Glucose has started to creep up, will resume low dose of lantus at 3 units, but hold novolog given hypoglycemia at presentation.  Lenice Pressman, M.D., Ph.D. 10/28/2018, 11:04 AM

## 2018-10-29 LAB — GLUCOSE, CAPILLARY: Glucose-Capillary: 89 mg/dL (ref 70–99)

## 2018-10-29 MED ORDER — INSULIN GLARGINE 100 UNIT/ML ~~LOC~~ SOLN: 3.0000 [IU] | Freq: Every day | SUBCUTANEOUS | 0 refills | Status: DC

## 2018-10-29 MED ORDER — ACCU-CHEK SOFT TOUCH LANCETS MISC: 0 refills | Status: AC

## 2018-10-29 MED ORDER — GLUCOSE BLOOD VI STRP: ORAL_STRIP | 0 refills | Status: AC

## 2018-10-29 MED ORDER — CALCITRIOL 0.25 MCG PO CAPS: 0.2500 ug | ORAL_CAPSULE | ORAL | 0 refills | Status: DC

## 2018-10-29 MED ORDER — ACCU-CHEK AVIVA DEVI: 0 refills | Status: AC

## 2018-10-29 MED ORDER — HYDROMORPHONE HCL 2 MG PO TABS: 2.0000 mg | ORAL_TABLET | ORAL | 0 refills | Status: AC | PRN

## 2018-10-29 NOTE — Progress Notes (Signed)
   Subjective: No complaints. Slept well overnight. Did not need any additional pain medicine. Discussed plan to discharge today. Instructed to schedule close f/u with PCP. First outpatient HD is tomorrow   Objective:  Vital signs in last 24 hours: Vitals:   10/28/18 1920 10/28/18 2224 10/29/18 0634 10/29/18 0851  BP: 119/77 132/75 135/82 126/65  Pulse: 94 90 87 89  Resp:      Temp: 98.3 F (36.8 C) 97.7 F (36.5 C) 98.7 F (37.1 C) 98 F (36.7 C)  TempSrc: Oral Oral Oral Oral  SpO2: 98% 96% 96% 92%  Weight:       Gen: laying in bed, NAD  Ext: left arm AV graft with good thrill, no drainage or redness, some warmth. 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   ESRD on HD: historically noncompliant with HD and frequent changes in goals of care. At this time, she would like to continue with HD and pursue permanent access and outpt dialysis center - HD per nephrology - vascular surgery consult: AV graft placed 2/17, pain well controlled. Will wait 4 weeks prior to accessing graft  - outpt HD scheduled for tomorrow   DM2: home regiment includes lantus 5U qhs, novolog 5U tid wc - CBGs elevated up to 246 today - will start lantus 3U qhs and instruct her to schedule close outpt f/u   HTN: continue home  amlodipine  Untreated hep C: LFTs normal, no focal abnormality on CT. Will need outpt f/u in hep C clinic  Dispo: Anticipated discharge today   Isabelle Course, MD 10/29/2018, 9:34 AM Pager: [phone]

## 2018-10-29 NOTE — Care Management Note (Signed)
Case Management Note  Patient Details  Name: BABY GIEGER MRN: [id number] Date of Birth: 02-13-1956  Subjective/Objective:    Pt admitted s/p missing HD sessions               Action/Plan:  PTA independent from home.  Pt is ESRD on outpt HD - per renal pt now has an outpt HD center.  Pt confirmed she has a PCP and active prescription insurance.  Pt declined all CM interventions - pt informed CM " I can take care of everything on my own".    Expected Discharge Date:  10/28/18               Expected Discharge Plan:  Home/Self Care  In-House Referral:     Discharge planning Services  CM Consult  Post Acute Care Choice:    Choice offered to:     DME Arranged:    DME Agency:     HH Arranged:    HH Agency:     Status of Service:  Completed, signed off  If discussed at [employer] of Stay Meetings, dates discussed:    Additional Comments:  Maryclare Labrador, RN 10/29/2018, 10:56 AM

## 2018-10-30 NOTE — Discharge Summary (Addendum)
Name: Rhonda Lynch MRN: [id number] DOB: 09/30/1955 63 y.o. PCP: Tawny Asal  Date of Admission: 10/23/2018  6:46 AM Date of Discharge: 10/29/2018 Attending Physician: Lenice Pressman  Discharge Diagnosis: 1. Hypoglycemia, DM2 2. ESRD on HD 3. HTN  Discharge Medications: Allergies as of 10/29/2018      Reactions   Acetaminophen Nausea And Vomiting   Patient states Acetaminophen and Acetaminophen containing products make her nauseated. She demonstrated this 06/21/18 with nausea followed by emesis.   Hydroxyzine Itching      Medication List    TAKE these medications   ACCU-CHEK AVIVA device Use as instructed   accu-chek soft touch lancets Use twice per day.   amLODipine 5 MG tablet Commonly known as:  NORVASC Take 1 tablet (5 mg total) by mouth at bedtime.   calcitRIOL 0.25 MCG capsule Commonly known as:  ROCALTROL Take 1 capsule (0.25 mcg total) by mouth every Monday, Wednesday, and Friday with hemodialysis.   glucose blood test strip Commonly known as:  ACCU-CHEK AVIVA Use twice per day.   HYDROmorphone 2 MG tablet Commonly known as:  DILAUDID Take 1 tablet (2 mg total) by mouth every 4 (four) hours as needed for up to 3 days for severe pain.   insulin aspart 100 UNIT/ML injection Commonly known as:  novoLOG Inject 5 Units into the skin 3 (three) times daily with meals.   insulin glargine 100 UNIT/ML injection Commonly known as:  LANTUS Inject 0.03 mLs (3 Units total) into the skin at bedtime. What changed:  how much to take   INSULIN SYRINGE .3CC/31GX5/16" 31G X 5/16" 0.3 ML Misc Inject 1 each into the skin 4 (four) times daily.   multivitamin Tabs tablet Take 1 tablet by mouth at bedtime.       Disposition and follow-up:   Rhonda Lynch was discharged from [facility] in Stable condition.  At the hospital follow up visit please address:  1.  Review insulin regiment. Make sure she has a glucometer at home  Is  there a behavioral health specialist you can connect her with? She expressed added stress/depression over HD and other health issues. Would like to talk to someone about it.   2.  Labs / imaging needed at time of follow-up: needs outpt Hep C clinic f/u for active infection   3.  Pending labs/ test needing follow-up: none   Follow-up Appointments: Follow-up Information    Clent Demark, PA-C. Call.   Specialty:  Physician Assistant Why:  Make an appt for sometime next week to discuss insulin dosing and ambien.  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   ESRD on HD: Did not have outpt HD prior to discharge, so last HD was two weeks ago during previous admission. Historically noncompliant with HD and frequent changes in goals of care. At this time, she would like to continue with HD and pursue permanent access and outpt dialysis center. Vascular surgery placed  left arm AV graft on 2/17. She was discharged with po dilaudid for pain control. Outpatient HD was arranged.   Hypoglycemia, DM2: Presented with hypoglycemia in the 40s secondary to skipping HD for two weeks and continuing to take her insulin. She was treated with d10 drip and urgent dialysis. Insulin was held during admission. Home regiment includes lantus 5U qhs, novolog 5U tid wc. Lantus was decreased to 3U qhs on discharge. Instructed to schedule close PCP f/u.   Cough, nausea, diarrhea: RVP  negative. Symptoms resolved after HD. Most likely 2/2 uremia.   HTN: continued home amlodipine  Untreated hep C: LFTs normal, no focal abnormality on CT. Will need outpt f/u in hep C clinic  Discharge Vitals:   BP 126/65 (BP Location: Right Arm)   Pulse 89   Temp 98 F (36.7 C) (Oral)   Resp 19   Wt 59.7 kg   SpO2 92%   BMI 24.87 kg/m   Pertinent Labs, Studies, and Procedures:  BMP Latest Ref Rng & Units 10/28/2018 10/27/2018 10/25/2018  Glucose 70 - 99 mg/dL 160(H) 86 82  BUN 8 - 23 mg/dL 54(H) 42(H) 18  Creatinine 0.44 - 1.00 mg/dL 10.18(H) 8.60(H) 4.77(H)  BUN/Creat Ratio 12 - 28 - - -  Sodium 135 - 145 mmol/L 133(L) 133(L) 135  Potassium 3.5 - 5.1 mmol/L 5.0 4.5 3.8  Chloride 98 - 111 mmol/L 97(L) 101 99  CO2 22 - 32 mmol/L 22 23 25   Calcium 8.9 - 10.3 mg/dL 7.9(L) 8.0(L) 8.1(L)     Discharge Instructions: Discharge Instructions    Diet - low sodium heart healthy   Complete by:  As directed    Discharge instructions   Complete by:  As directed    Ms. Rhonda, Lynch were admitted to the hospital because your blood sugar got really low. This was caused by the insulin building up in your body when you weren't going to dialysis. I'm glad you were able to get permanent dialysis access placed in your left arm. This will take 4 weeks to mature so you will have to keep the line in your right chest/neck in place for several more weeks.   I have sent a short prescription of pain medicine to your pharmacy to use if your left arm starts hurting a lot.   For you diabetes, please decrease your Lantus to 3U at night. Check your blood sugars when you first wake up and then again before you eat dinner. Please schedule a follow up appointment with your primary care doctor next week to discuss your insulin regiment.   When you call Dr. Randell Loop office, I encourage you to ask them if they have a therapist you could get connected with as well. You've been through a lot with dialysis and it  would be nice to have someone to talk to about everything.   It was a pleasure taking care you. I wish you the best.   -Dr. Donne Hazel   Discharge instructions   Complete by:  As directed    [place]:  [facility] [address]   First treatment is :Thursday, October 30, 2018 at 12:15pm. Schedule and chair time BP:ZWCHENI,DPOEUMPN.Saturday at 12:15pm   Increase activity slowly   Complete by:  As directed       Signed: Isabelle Course, MD 10/30/2018, 1:02 PM   Pager: [phone]    Internal Medicine Attending Note:  I saw and examined the patient on the day  of discharge. I reviewed and agree with the discharge summary written by the house staff.  Lenice Pressman, M.D., Ph.D.

## 2018-11-06 ENCOUNTER — Other Ambulatory Visit: Payer: Self-pay | Admitting: Internal Medicine

## 2018-11-10 ENCOUNTER — Other Ambulatory Visit: Payer: Self-pay | Admitting: Internal Medicine

## 2018-11-16 ENCOUNTER — Other Ambulatory Visit: Payer: Self-pay

## 2018-11-16 ENCOUNTER — Emergency Department (HOSPITAL_COMMUNITY): Payer: Medicare Other

## 2018-11-16 ENCOUNTER — Inpatient Hospital Stay (HOSPITAL_COMMUNITY)
Admission: EM | Admit: 2018-11-16 | Discharge: 2018-11-21 | DRG: 193 | Disposition: A | Discharge status: Home / Self Care | Payer: Medicare Other | Attending: Internal Medicine | Admitting: Internal Medicine

## 2018-11-16 ENCOUNTER — Encounter (HOSPITAL_COMMUNITY): Payer: Self-pay | Admitting: Emergency Medicine

## 2018-11-16 ENCOUNTER — Other Ambulatory Visit (HOSPITAL_COMMUNITY): Payer: Self-pay

## 2018-11-16 DIAGNOSIS — I444 Left anterior fascicular block: Secondary | ICD-10-CM | POA: Diagnosis present

## 2018-11-16 DIAGNOSIS — R197 Diarrhea, unspecified: Secondary | ICD-10-CM | POA: Diagnosis present

## 2018-11-16 DIAGNOSIS — D638 Anemia in other chronic diseases classified elsewhere: Secondary | ICD-10-CM | POA: Diagnosis present

## 2018-11-16 DIAGNOSIS — I10 Essential (primary) hypertension: Secondary | ICD-10-CM | POA: Diagnosis present

## 2018-11-16 DIAGNOSIS — I120 Hypertensive chronic kidney disease with stage 5 chronic kidney disease or end stage renal disease: Secondary | ICD-10-CM | POA: Diagnosis present

## 2018-11-16 DIAGNOSIS — J9601 Acute respiratory failure with hypoxia: Secondary | ICD-10-CM | POA: Diagnosis present

## 2018-11-16 DIAGNOSIS — R042 Hemoptysis: Secondary | ICD-10-CM | POA: Diagnosis present

## 2018-11-16 DIAGNOSIS — Z79899 Other long term (current) drug therapy: Secondary | ICD-10-CM

## 2018-11-16 DIAGNOSIS — R1013 Epigastric pain: Secondary | ICD-10-CM | POA: Diagnosis present

## 2018-11-16 DIAGNOSIS — J189 Pneumonia, unspecified organism: Secondary | ICD-10-CM | POA: Diagnosis not present

## 2018-11-16 DIAGNOSIS — Z9115 Patient's noncompliance with renal dialysis: Secondary | ICD-10-CM

## 2018-11-16 DIAGNOSIS — N2581 Secondary hyperparathyroidism of renal origin: Secondary | ICD-10-CM | POA: Diagnosis present

## 2018-11-16 DIAGNOSIS — R0602 Shortness of breath: Secondary | ICD-10-CM

## 2018-11-16 DIAGNOSIS — D509 Iron deficiency anemia, unspecified: Secondary | ICD-10-CM | POA: Diagnosis present

## 2018-11-16 DIAGNOSIS — E1122 Type 2 diabetes mellitus with diabetic chronic kidney disease: Secondary | ICD-10-CM | POA: Diagnosis present

## 2018-11-16 DIAGNOSIS — Z9119 Patient's noncompliance with other medical treatment and regimen: Secondary | ICD-10-CM

## 2018-11-16 DIAGNOSIS — Z992 Dependence on renal dialysis: Secondary | ICD-10-CM

## 2018-11-16 DIAGNOSIS — D631 Anemia in chronic kidney disease: Secondary | ICD-10-CM | POA: Diagnosis present

## 2018-11-16 DIAGNOSIS — I776 Arteritis, unspecified: Secondary | ICD-10-CM | POA: Diagnosis present

## 2018-11-16 DIAGNOSIS — D649 Anemia, unspecified: Secondary | ICD-10-CM | POA: Diagnosis present

## 2018-11-16 DIAGNOSIS — Z9114 Patient's other noncompliance with medication regimen: Secondary | ICD-10-CM

## 2018-11-16 DIAGNOSIS — J181 Lobar pneumonia, unspecified organism: Secondary | ICD-10-CM

## 2018-11-16 DIAGNOSIS — E8889 Other specified metabolic disorders: Secondary | ICD-10-CM | POA: Diagnosis present

## 2018-11-16 DIAGNOSIS — F17210 Nicotine dependence, cigarettes, uncomplicated: Secondary | ICD-10-CM | POA: Diagnosis present

## 2018-11-16 DIAGNOSIS — E119 Type 2 diabetes mellitus without complications: Secondary | ICD-10-CM

## 2018-11-16 DIAGNOSIS — J810 Acute pulmonary edema: Secondary | ICD-10-CM

## 2018-11-16 DIAGNOSIS — Z8673 Personal history of transient ischemic attack (TIA), and cerebral infarction without residual deficits: Secondary | ICD-10-CM

## 2018-11-16 DIAGNOSIS — N186 End stage renal disease: Secondary | ICD-10-CM | POA: Diagnosis present

## 2018-11-16 DIAGNOSIS — Z794 Long term (current) use of insulin: Secondary | ICD-10-CM

## 2018-11-16 DIAGNOSIS — Z86711 Personal history of pulmonary embolism: Secondary | ICD-10-CM

## 2018-11-16 DIAGNOSIS — J159 Unspecified bacterial pneumonia: Secondary | ICD-10-CM | POA: Diagnosis present

## 2018-11-16 DIAGNOSIS — J9600 Acute respiratory failure, unspecified whether with hypoxia or hypercapnia: Secondary | ICD-10-CM

## 2018-11-16 DIAGNOSIS — F17200 Nicotine dependence, unspecified, uncomplicated: Secondary | ICD-10-CM | POA: Diagnosis present

## 2018-11-16 DIAGNOSIS — F1490 Cocaine use, unspecified, uncomplicated: Secondary | ICD-10-CM | POA: Diagnosis present

## 2018-11-16 DIAGNOSIS — Z8679 Personal history of other diseases of the circulatory system: Secondary | ICD-10-CM

## 2018-11-16 DIAGNOSIS — Z86718 Personal history of other venous thrombosis and embolism: Secondary | ICD-10-CM

## 2018-11-16 DIAGNOSIS — B1920 Unspecified viral hepatitis C without hepatic coma: Secondary | ICD-10-CM | POA: Diagnosis present

## 2018-11-16 DIAGNOSIS — J811 Chronic pulmonary edema: Secondary | ICD-10-CM | POA: Diagnosis present

## 2018-11-16 DIAGNOSIS — Z91199 Patient's noncompliance with other medical treatment and regimen due to unspecified reason: Secondary | ICD-10-CM

## 2018-11-16 LAB — RENAL FUNCTION PANEL
Albumin: 2.4 g/dL — ABNORMAL LOW (ref 3.5–5.0)
Anion gap: 17 — ABNORMAL HIGH (ref 5–15)
BUN: 48 mg/dL — ABNORMAL HIGH (ref 8–23)
CO2: 19 mmol/L — ABNORMAL LOW (ref 22–32)
CREATININE: 10.31 mg/dL — AB (ref 0.44–1.00)
Calcium: 7.3 mg/dL — ABNORMAL LOW (ref 8.9–10.3)
Chloride: 103 mmol/L (ref 98–111)
GFR, EST AFRICAN AMERICAN: 4 mL/min — AB (ref >60)
GFR, EST NON AFRICAN AMERICAN: 4 mL/min — AB (ref >60)
Glucose, Bld: 112 mg/dL — ABNORMAL HIGH (ref 70–99)
Phosphorus: 7.2 mg/dL — ABNORMAL HIGH (ref 2.5–4.6)
Potassium: 4 mmol/L (ref 3.5–5.1)
Sodium: 139 mmol/L (ref 135–145)

## 2018-11-16 LAB — COMPREHENSIVE METABOLIC PANEL
ALT: 16 U/L (ref 0–44)
AST: 31 U/L (ref 15–41)
Albumin: 2.6 g/dL — ABNORMAL LOW (ref 3.5–5.0)
Alkaline Phosphatase: 83 U/L (ref 38–126)
Anion gap: 15 (ref 5–15)
BUN: 43 mg/dL — ABNORMAL HIGH (ref 8–23)
CO2: 17 mmol/L — ABNORMAL LOW (ref 22–32)
CREATININE: 9.84 mg/dL — AB (ref 0.44–1.00)
Calcium: 7.3 mg/dL — ABNORMAL LOW (ref 8.9–10.3)
Chloride: 106 mmol/L (ref 98–111)
GFR calc Af Amer: 4 mL/min — ABNORMAL LOW (ref >60)
GFR calc non Af Amer: 4 mL/min — ABNORMAL LOW (ref >60)
Glucose, Bld: 173 mg/dL — ABNORMAL HIGH (ref 70–99)
Potassium: 3.9 mmol/L (ref 3.5–5.1)
Sodium: 138 mmol/L (ref 135–145)
Total Bilirubin: 1 mg/dL (ref 0.3–1.2)
Total Protein: 6.4 g/dL — ABNORMAL LOW (ref 6.5–8.1)

## 2018-11-16 LAB — CBC
HCT: 23.7 % — ABNORMAL LOW (ref 36.0–46.0)
HCT: 21.2 % — ABNORMAL LOW (ref 36.0–46.0)
Hemoglobin: 7.1 g/dL — ABNORMAL LOW (ref 12.0–15.0)
Hemoglobin: 6.4 g/dL — CL (ref 12.0–15.0)
MCH: 23.4 pg — ABNORMAL LOW (ref 26.0–34.0)
MCH: 23.5 pg — ABNORMAL LOW (ref 26.0–34.0)
MCHC: 30 g/dL (ref 30.0–36.0)
MCHC: 30.2 g/dL (ref 30.0–36.0)
MCV: 78.2 fL — ABNORMAL LOW (ref 80.0–100.0)
MCV: 77.9 fL — ABNORMAL LOW (ref 80.0–100.0)
NRBC: 0.1 % (ref 0.0–0.2)
NRBC: 0.3 % — AB (ref 0.0–0.2)
Platelets: 244 10*3/uL (ref 150–400)
Platelets: 246 10*3/uL (ref 150–400)
RBC: 3.03 MIL/uL — ABNORMAL LOW (ref 3.87–5.11)
RBC: 2.72 MIL/uL — AB (ref 3.87–5.11)
RDW: 24.2 % — ABNORMAL HIGH (ref 11.5–15.5)
RDW: 22.8 % — ABNORMAL HIGH (ref 11.5–15.5)
WBC: 14.6 10*3/uL — ABNORMAL HIGH (ref 4.0–10.5)
WBC: 12.8 10*3/uL — ABNORMAL HIGH (ref 4.0–10.5)

## 2018-11-16 LAB — SURGICAL PCR SCREEN
MRSA, PCR: NEGATIVE
Staphylococcus aureus: NEGATIVE

## 2018-11-16 LAB — LIPASE, BLOOD: LIPASE: 38 U/L (ref 11–51)

## 2018-11-16 LAB — GLUCOSE, CAPILLARY
Glucose-Capillary: 134 mg/dL — ABNORMAL HIGH (ref 70–99)
Glucose-Capillary: 102 mg/dL — ABNORMAL HIGH (ref 70–99)

## 2018-11-16 LAB — HEPATITIS B SURFACE ANTIGEN: HEP B S AG: NEGATIVE

## 2018-11-16 LAB — PREPARE RBC (CROSSMATCH)

## 2018-11-16 MED ORDER — SODIUM CHLORIDE 0.9 % IV SOLN
500.0000 mg | Freq: Once | INTRAVENOUS | Status: AC
  Administered 2018-11-16: 500 mg via INTRAVENOUS
  Filled 2018-11-16: qty 500

## 2018-11-16 MED ORDER — DOXERCALCIFEROL 4 MCG/2ML IV SOLN
4.0000 ug | INTRAVENOUS | Status: DC
  Administered 2018-11-18 – 2018-11-20 (×2): 4 ug via INTRAVENOUS
  Filled 2018-11-16 (×2): qty 2

## 2018-11-16 MED ORDER — METOCLOPRAMIDE HCL 5 MG/ML IJ SOLN
10.0000 mg | Freq: Once | INTRAMUSCULAR | Status: AC
  Administered 2018-11-16: 10 mg via INTRAVENOUS
  Filled 2018-11-16: qty 2

## 2018-11-16 MED ORDER — ONDANSETRON HCL 4 MG/2ML IJ SOLN
4.0000 mg | Freq: Once | INTRAMUSCULAR | Status: AC
  Administered 2018-11-16: 4 mg via INTRAVENOUS
  Filled 2018-11-16: qty 2

## 2018-11-16 MED ORDER — HEPARIN SODIUM (PORCINE) 1000 UNIT/ML IJ SOLN
INTRAMUSCULAR | Status: AC
  Filled 2018-11-16: qty 4

## 2018-11-16 MED ORDER — HEPARIN SODIUM (PORCINE) 1000 UNIT/ML DIALYSIS
3000.0000 [IU] | Freq: Once | INTRAMUSCULAR | Status: AC
  Administered 2018-11-16: 3000 [IU] via INTRAVENOUS_CENTRAL

## 2018-11-16 MED ORDER — PENTAFLUOROPROP-TETRAFLUOROETH EX AERO: 1.0000 "application " | INHALATION_SPRAY | CUTANEOUS | Status: DC | PRN

## 2018-11-16 MED ORDER — SODIUM CHLORIDE 0.9 % IV SOLN: 100.0000 mL | INTRAVENOUS | Status: DC | PRN

## 2018-11-16 MED ORDER — INSULIN ASPART 100 UNIT/ML ~~LOC~~ SOLN: 0.0000 [IU] | Freq: Every day | SUBCUTANEOUS | Status: DC

## 2018-11-16 MED ORDER — CHLORHEXIDINE GLUCONATE CLOTH 2 % EX PADS
6.0000 | MEDICATED_PAD | Freq: Every day | CUTANEOUS | Status: DC
  Administered 2018-11-16 – 2018-11-19 (×4): 6 via TOPICAL

## 2018-11-16 MED ORDER — MORPHINE SULFATE (PF) 4 MG/ML IV SOLN
4.0000 mg | Freq: Once | INTRAVENOUS | Status: AC
  Administered 2018-11-16: 4 mg via INTRAVENOUS
  Filled 2018-11-16: qty 1

## 2018-11-16 MED ORDER — SODIUM CHLORIDE 0.9% FLUSH
3.0000 mL | Freq: Once | INTRAVENOUS | Status: AC
  Administered 2018-11-16: 3 mL via INTRAVENOUS

## 2018-11-16 MED ORDER — INSULIN ASPART 100 UNIT/ML ~~LOC~~ SOLN
0.0000 [IU] | Freq: Three times a day (TID) | SUBCUTANEOUS | Status: DC
  Administered 2018-11-16: 1 [IU] via SUBCUTANEOUS
  Administered 2018-11-17 – 2018-11-19 (×3): 2 [IU] via SUBCUTANEOUS
  Administered 2018-11-19: 1 [IU] via SUBCUTANEOUS
  Administered 2018-11-19 – 2018-11-21 (×3): 2 [IU] via SUBCUTANEOUS

## 2018-11-16 MED ORDER — IBUPROFEN 400 MG PO TABS
400.0000 mg | ORAL_TABLET | Freq: Four times a day (QID) | ORAL | Status: DC | PRN
  Administered 2018-11-16 – 2018-11-21 (×3): 400 mg via ORAL
  Filled 2018-11-16 (×4): qty 1

## 2018-11-16 MED ORDER — HEPARIN SODIUM (PORCINE) 1000 UNIT/ML DIALYSIS
3400.0000 [IU] | INTRAMUSCULAR | Status: DC | PRN
  Administered 2018-11-16: 3400 [IU]

## 2018-11-16 MED ORDER — AZITHROMYCIN 500 MG PO TABS
500.0000 mg | ORAL_TABLET | ORAL | Status: DC
  Administered 2018-11-16 – 2018-11-21 (×6): 500 mg via ORAL
  Filled 2018-11-16 (×6): qty 1

## 2018-11-16 MED ORDER — ALTEPLASE 2 MG IJ SOLR: 2.0000 mg | Freq: Once | INTRAMUSCULAR | Status: DC | PRN

## 2018-11-16 MED ORDER — SODIUM CHLORIDE 0.9 % IV SOLN
1.0000 g | INTRAVENOUS | Status: DC
  Administered 2018-11-16 – 2018-11-20 (×5): 1 g via INTRAVENOUS
  Filled 2018-11-16 (×5): qty 10

## 2018-11-16 MED ORDER — DOXERCALCIFEROL 4 MCG/2ML IV SOLN
4.0000 ug | Freq: Once | INTRAVENOUS | Status: AC
  Administered 2018-11-16: 4 ug via INTRAVENOUS
  Filled 2018-11-16: qty 2

## 2018-11-16 MED ORDER — MUPIROCIN 2 % EX OINT
1.0000 "application " | TOPICAL_OINTMENT | Freq: Two times a day (BID) | CUTANEOUS | Status: AC
  Administered 2018-11-16 – 2018-11-20 (×8): 1 via NASAL
  Filled 2018-11-16 (×2): qty 22

## 2018-11-16 MED ORDER — PRO-STAT SUGAR FREE PO LIQD
30.0000 mL | Freq: Two times a day (BID) | ORAL | Status: DC
  Administered 2018-11-16 – 2018-11-18 (×3): 30 mL via ORAL
  Filled 2018-11-16 (×7): qty 30

## 2018-11-16 MED ORDER — MORPHINE SULFATE (PF) 2 MG/ML IV SOLN
2.0000 mg | INTRAVENOUS | Status: AC | PRN
  Administered 2018-11-16 – 2018-11-17 (×4): 2 mg via INTRAVENOUS
  Filled 2018-11-16 (×4): qty 1

## 2018-11-16 MED ORDER — HEPARIN SODIUM (PORCINE) 5000 UNIT/ML IJ SOLN
5000.0000 [IU] | Freq: Three times a day (TID) | INTRAMUSCULAR | Status: DC
  Administered 2018-11-16 – 2018-11-21 (×14): 5000 [IU] via SUBCUTANEOUS
  Filled 2018-11-16 (×13): qty 1

## 2018-11-16 MED ORDER — DIPHENHYDRAMINE HCL 50 MG/ML IJ SOLN
25.0000 mg | Freq: Once | INTRAMUSCULAR | Status: AC
  Administered 2018-11-16: 25 mg via INTRAVENOUS
  Filled 2018-11-16: qty 1

## 2018-11-16 MED ORDER — SODIUM CHLORIDE 0.9 % IV SOLN
1.0000 g | Freq: Once | INTRAVENOUS | Status: AC
  Administered 2018-11-16: 1 g via INTRAVENOUS
  Filled 2018-11-16: qty 10

## 2018-11-16 MED ORDER — LIDOCAINE HCL (PF) 1 % IJ SOLN: 5.0000 mL | INTRAMUSCULAR | Status: DC | PRN

## 2018-11-16 MED ORDER — HEPARIN SODIUM (PORCINE) 1000 UNIT/ML IJ SOLN
INTRAMUSCULAR | Status: AC
  Administered 2018-11-16: 3000 [IU] via INTRAVENOUS_CENTRAL
  Filled 2018-11-16: qty 3

## 2018-11-16 MED ORDER — HEPARIN SODIUM (PORCINE) 1000 UNIT/ML DIALYSIS: 1000.0000 [IU] | INTRAMUSCULAR | Status: DC | PRN

## 2018-11-16 MED ORDER — ALBUTEROL SULFATE (2.5 MG/3ML) 0.083% IN NEBU: 2.5000 mg | INHALATION_SOLUTION | RESPIRATORY_TRACT | Status: DC | PRN

## 2018-11-16 MED ORDER — LIDOCAINE-PRILOCAINE 2.5-2.5 % EX CREA: 1.0000 "application " | TOPICAL_CREAM | CUTANEOUS | Status: DC | PRN

## 2018-11-16 MED ORDER — SODIUM CHLORIDE 0.9% IV SOLUTION: Freq: Once | INTRAVENOUS | Status: DC

## 2018-11-16 NOTE — ED Notes (Addendum)
ED TO INPATIENT HANDOFF REPORT  ED Nurse Name and Phone #: Vilinda Blanks RN [phone]  S Name/Age/Gender Rhonda Lynch 63 y.o. female Room/Bed: 017C/017C  Code Status   Code Status: Prior  Home/SNF/Other Home Patient oriented to: self, place, time and situation Is this baseline? Yes   Triage Complete: Triage complete  Chief Complaint abd pain/sob  Triage Note Patient reports n/v/d x2 days. States that she was supposed to be dialyzed yesterday but did not go because she was feeling bad. She also reports she did not get her entire session on Thursday. Patient very tachypneic and anxious in triage. Patients O2 sat 82% on room air.   Allergies Allergies  Allergen Reactions  . Acetaminophen Nausea And Vomiting    Patient states Acetaminophen and Acetaminophen containing products make her nauseated. She demonstrated this 06/21/18 with nausea followed by emesis.  Marland Kitchen Hydroxyzine Itching    Level of Care/Admitting Diagnosis ED Disposition    ED Disposition Condition [facility] Area: [place] [100100]  Level of Care: Med-Surg [16]  I expect the patient will be discharged within 24 hours: Yes  LOW acuity---Tx typically complete <24 hrs---ACUTE conditions typically can be evaluated <24 hours---LABS likely to return to acceptable levels <24 hours---IS near functional baseline---EXPECTED to return to current living arrangement---NOT newly hypoxic: Meets criteria for 5C-Observation unit  Diagnosis: Pneumonia [227785]  Admitting Physician: Barb Merino [[id number]]  Attending Physician: Barb Merino [[id number]]  PT Class (Do Not Modify): Observation [104]  PT Acc Code (Do Not Modify): Observation [10022]       B Medical/Surgery History Past Medical History:  Diagnosis Date  . Diabetes mellitus without complication ([facility])   . ESRD (end stage renal disease) on dialysis ([place])    "TTS; [facility]; they're moving me to another one" (09/22/2018)   . Hepatitis C    diagnosed 09/2018  . Hypertension   . Oxygen deficiency    2 L at night  . Stroke ([facility])   . Substance abuse ([place])    has been clean for 4 months    Past Surgical History:  Procedure Laterality Date  . AV FISTULA PLACEMENT Left 03/05/2018   Procedure: ARTERIOVENOUS (AV) FISTULA CREATION  LEFT UPPER EXTREMITY;  Surgeon: [name], MD;  Location: [facility];  Service: Vascular;  Laterality: Left;  . AV FISTULA PLACEMENT Left 07/21/2018   Procedure: ARTERIOVENOUS (AV) FISTULA CREATION;  Surgeon: Marty Heck, MD;  Location: [place];  Service: Vascular;  Laterality: Left;  . AV FISTULA PLACEMENT Left 10/27/2018   Procedure: INSERTION OF ARTERIOVENOUS (AV) GORE-TEX GRAFT UPPER ARM;  Surgeon: Marty Heck, MD;  Location: [facility];  Service: Vascular;  Laterality: Left;  . BASCILIC VEIN TRANSPOSITION Left 09/29/2018   Procedure: BASILIC VEIN TRANSPOSITION SECOND STAGE LEFT UPPER ARM;  Surgeon: Marty Heck, MD;  Location: [facility];  Service: Vascular;  Laterality: Left;  . HEMATOMA EVACUATION Left 03/06/2018   Procedure: EVACUATION HEMATOMA LEFT ARM;  Surgeon: Angelia Mould, MD;  Location: [facility];  Service: Vascular;  Laterality: Left;  . I&D EXTREMITY Left 10/06/2018   Procedure: IRRIGATION AND DEBRIDEMENT ARM;  Surgeon: Marty Heck, MD;  Location: [place] [facility];  Service: Vascular;  Laterality: Left;  . INSERTION OF DIALYSIS CATHETER Left 04/29/2018   Procedure: INSERTION OF DIALYSIS CATHETER LEFT INTERNAL JUGULAR;  Surgeon: Angelia Mould, MD;  Location: [facility] OR;  Service: Vascular;  Laterality: Left;  . IR FLUORO GUIDE CV LINE RIGHT  02/18/2018  . IR FLUORO GUIDE CV LINE RIGHT  02/26/2018  . IR REMOVAL TUN CV CATH W/O FL  04/27/2018  . IR US GUIDE VASC ACCESS RIGHT  02/18/2018  . TEE WITHOUT CARDIOVERSION N/A 04/29/2018   Procedure: TRANSESOPHAGEAL ECHOCARDIOGRAM (TEE);  Surgeon: Sueanne Margarita, MD;  Location: [facility] ENDOSCOPY;  Service: Cardiovascular;   Laterality: N/A;  . VENOGRAM Left 10/27/2018   Procedure: LEFT CENTRAL VENOGRAM;  Surgeon: Marty Heck, MD;  Location: [place];  Service: Vascular;  Laterality: Left;     A IV Location/Drains/Wounds Patient Lines/Drains/Airways Status   Active Line/Drains/Airways    Name:   Placement date:   Placement time:   Site:   Days:   Peripheral IV 10/25/18 Right;Posterior Forearm   10/25/18    2040    Forearm   22   Peripheral IV 11/16/18 Right Hand   11/16/18    0445    Hand   less than 1   Fistula / Graft Left Upper arm Arteriovenous fistula   07/21/18    1500    Upper arm   118   Fistula / Graft Left Upper arm Arteriovenous vein graft   10/27/18    1200    Upper arm   20   Hemodialysis Catheter Right Subclavian Double-lumen   -    -    Subclavian      Incision (Closed) 03/05/18 Arm Left   03/05/18    1510     256   Incision (Closed) 04/29/18 Chest Right   04/29/18    1323     201   Incision (Closed) 07/21/18 Arm Left   07/21/18    1455     118   Incision (Closed) 09/29/18 Arm Left   09/29/18    1206     48   Incision (Closed) 09/29/18 Elbow Left   09/29/18    1214     48   Incision (Closed) 09/29/18 Axilla Left   09/29/18    1214     48   Incision (Closed) 10/06/18 Arm Left   10/06/18    1521     41   Incision (Closed) 10/27/18 Arm Left   10/27/18    1000     20   Incision (Closed) 10/27/18 Arm Left   10/27/18    1215     20          Intake/Output Last 24 hours  Intake/Output Summary (Last 24 hours) at 11/16/2018 0919 Last data filed at 11/16/2018 0746 Gross per 24 hour  Intake 250 ml  Output -  Net 250 ml    Labs/Imaging Results for orders placed or performed during the hospital encounter of 11/16/18 (from the past 48 hour(s))  Lipase, blood     Status: None   Collection Time: 11/16/18  3:49 AM  Result Value Ref Range   Lipase 38 11 - 51 U/L    Comment: Performed at [facility], [address] [place], [place] [address]  Comprehensive metabolic panel     Status:  Abnormal   Collection Time: 11/16/18  3:49 AM  Result Value Ref Range   Sodium 138 135 - 145 mmol/L   Potassium 3.9 3.5 - 5.1 mmol/L   Chloride 106 98 - 111 mmol/L   CO2 17 (L) 22 - 32 mmol/L   Glucose, Bld 173 (H) 70 - 99 mg/dL   BUN 43 (H) 8 - 23 mg/dL   Creatinine, Ser 9.84 (  H) 0.44 - 1.00 mg/dL   Calcium 7.3 (L) 8.9 - 10.3 mg/dL   Total Protein 6.4 (L) 6.5 - 8.1 g/dL   Albumin 2.6 (L) 3.5 - 5.0 g/dL   AST 31 15 - 41 U/L   ALT 16 0 - 44 U/L   Alkaline Phosphatase 83 38 - 126 U/L   Total Bilirubin 1.0 0.3 - 1.2 mg/dL   GFR calc non Af Amer 4 (L) >60 mL/min   GFR calc Af Amer 4 (L) >60 mL/min   Anion gap 15 5 - 15    Comment: Performed at [facility] [address] [place], [place] [address]  CBC     Status: Abnormal   Collection Time: 11/16/18  3:49 AM  Result Value Ref Range   WBC 14.6 (H) 4.0 - 10.5 K/uL   RBC 3.03 (L) 3.87 - 5.11 MIL/uL   Hemoglobin 7.1 (L) 12.0 - 15.0 g/dL   HCT 23.7 (L) 36.0 - 46.0 %   MCV 78.2 (L) 80.0 - 100.0 fL   MCH 23.4 (L) 26.0 - 34.0 pg   MCHC 30.0 30.0 - 36.0 g/dL   RDW 24.2 (H) 11.5 - 15.5 %   Platelets 244 150 - 400 K/uL    Comment: REPEATED TO VERIFY   nRBC 0.1 0.0 - 0.2 %    Comment: Performed at [facility], [place] [address] [place], [place] [address]   Dg Chest Port 1 View  Result Date: 11/16/2018 CLINICAL DATA:  Nausea and vomiting with cough EXAM: PORTABLE CHEST 1 VIEW COMPARISON:  10/24/2018 FINDINGS: Cardiac shadow is enlarged. Aortic calcifications are again seen. Dialysis catheter is again noted on the right. Diffuse alveolar opacities are identified consistent with acute pulmonary edema no focal effusion is seen. Slight increased density is noted in the right base which may represent a superimposed infiltrate. IMPRESSION: Changes consistent with pulmonary edema bilaterally with likely superimposed infiltrate in the right base. Electronically Signed   By: Inez Catalina M.D.   On: 11/16/2018 05:01    Pending  Labs Unresulted Labs (From admission, onward)    Start     Ordered   11/16/18 0319  Urinalysis, Routine w reflex microscopic  ONCE - STAT,   STAT     11/16/18 0319   Signed and Held  HIV antibody (Routine Screening)  Once,   R     Signed and Held   Signed and Held  Culture, blood (routine x 2) Call MD if unable to obtain prior to antibiotics being given  BLOOD CULTURE X 2,   R    Comments:  If blood cultures drawn in Emergency Department - Do not draw and cancel order    Signed and Held   Signed and Held  Culture, sputum-assessment  Once,   R     Signed and Held   Signed and Held  Gram stain  Once,   R     Signed and Held   Signed and Held  Strep pneumoniae urinary antigen  Once,   R     Signed and Held   Visual merchandiser and Held  CBC  Tomorrow morning,   R     Signed and Held   Signed and Held  Basic metabolic panel  Tomorrow morning,   R     Signed and Held          Vitals/Pain Today's Vitals   11/16/18 0745 11/16/18 0749 11/16/18 0800 11/16/18 0815  BP: 113/73  123/86  115/81  Pulse: (!) 107  (!) 109   Resp: 20  (!) 21 (!) 22  Temp:      TempSrc:      SpO2: 100%  91%   Weight:  59 kg    Height:  5\' 3"  (1.6 m)    PainSc:        Isolation Precautions No active isolations  Medications Medications  sodium chloride flush (NS) 0.9 % injection 3 mL (3 mLs Intravenous Given 11/16/18 0447)  ondansetron (ZOFRAN) injection 4 mg (4 mg Intravenous Given 11/16/18 0446)  morphine 4 MG/ML injection 4 mg (4 mg Intravenous Given 11/16/18 0446)  cefTRIAXone (ROCEPHIN) 1 g in sodium chloride 0.9 % 100 mL IVPB (0 g Intravenous Stopped 11/16/18 0630)  azithromycin (ZITHROMAX) 500 mg in sodium chloride 0.9 % 250 mL IVPB (0 mg Intravenous Stopped 11/16/18 0746)  morphine 4 MG/ML injection 4 mg (4 mg Intravenous Given 11/16/18 0630)  metoCLOPramide (REGLAN) injection 10 mg (10 mg Intravenous Given 11/16/18 0630)  diphenhydrAMINE (BENADRYL) injection 25 mg (25 mg Intravenous Given 11/16/18 0630)     Mobility walks with person assist Low fall risk   Focused Assessments Renal Assessment Handoff:  Hemodialysis Schedule: Hemodialysis Schedule: Monday/Wednesday/Friday Last Hemodialysis date and time: Missed last two sessions   Restricted appendage: left arm     R Recommendations: See Admitting Provider Note  Report given to:   Additional Notes: Pt missed last two HD sessions due to N/V/D (now resolved).Here to ER with incr SOB. CXR shows bilat pulm edema. Plan to have nephrology consult for inpatient HD and fluid mgmt to resolve bilat pulm effusions and treat SOB s/s Hgb 7.1 and Cr 9 (this is her baseline) A/O X4, ambulatory with SBA (dizzy with ambulation)

## 2018-11-16 NOTE — Progress Notes (Signed)
Rhonda Lynch is a 63 y.o. female patient admitted from ED awake, alert - oriented  X 4 - no acute distress noted.    IV in place, occlusive dsg intact without redness.  Orientation to room, and floor completed with information packet given to patient/family.  Patient declined safety video at this time.  Admission INP armband ID verified with patient/family, and in place.   SR up x 2, fall assessment complete, with patient and family able to verbalize understanding of risk associated with falls, and verbalized understanding to call nsg before up out of bed.  Call light within reach, patient able to voice, and demonstrate understanding.  Skin, clean-dry- intact without evidence of bruising, or skin tears.   No evidence of skin break down noted on exam.     Will cont to eval and treat per MD orders.  Tama High, RN 11/16/2018 10:54 AM

## 2018-11-16 NOTE — ED Triage Notes (Signed)
Patient reports n/v/d x2 days. States that she was supposed to be dialyzed yesterday but did not go because she was feeling bad. She also reports she did not get her entire session on Thursday. Patient very tachypneic and anxious in triage. Patients O2 sat 82% on room air.

## 2018-11-16 NOTE — Consult Note (Addendum)
[facility] Renal Consultation Note    Indication for Consultation:  Management of ESRD/hemodialysis, anemia, hypertension/volume, and secondary hyperparathyroidism. PCP:  HPI: Rhonda Lynch is a 63 y.o. female with a history of ESRD on dialysis, hep C, HTN, CVA, and DM who presented to the ED with diarrhea x 3 days and productive cough. Also reports progressive dyspnea. On presentation to the ED, K 3.9, Ca 7.3, albumin 2.6, WBC  14.6 and hemoglobin 7.1. Chest x-ray revealed pulmonary edema bilaterally with likely superimposed infiltrate in the right base. She was started on rochepin and azithromycin. Nephrology consulted for dialysis.   On exam, patient continues to report fatigue, shortness of breath and cough. Reports ongoing diarrhea and moderate abdominal pain. Denies any nausea, CP, palpitations, peripheral edema at this time. Patient reports she felt too sick to go to dialysis yesterday, last treatment was 11/13/2018 however she has not gotten a full treatment lately due to cramping. Has been 2-3kg above her EDW. Also missed dialysis on 3/3 and treatment times averaging 2-3 hours with minimal UF past several treatments. She reports cramping started when she had her AV graft placed in her left upper extremity and is usually on the left side of her body. Last outpatient hemoglobin 8.4 on 3/5.  Past Medical History:  Diagnosis Date  . Diabetes mellitus without complication ([facility])   . ESRD (end stage renal disease) on dialysis ([facility])    "TTS; [facility]; they're moving me to another one" (09/22/2018)  . Hepatitis C    diagnosed 09/2018  . Hypertension   . Oxygen deficiency    2 L at night  . Stroke ([place])   . Substance abuse ([place])    has been clean for 4 months    Past Surgical History:  Procedure Laterality Date  . AV FISTULA PLACEMENT Left 03/05/2018   Procedure: ARTERIOVENOUS (AV) FISTULA CREATION  LEFT UPPER EXTREMITY;  Surgeon: [name], MD;  Location: [facility];   Service: Vascular;  Laterality: Left;  . AV FISTULA PLACEMENT Left 07/21/2018   Procedure: ARTERIOVENOUS (AV) FISTULA CREATION;  Surgeon: Marty Heck, MD;  Location: [place];  Service: Vascular;  Laterality: Left;  . AV FISTULA PLACEMENT Left 10/27/2018   Procedure: INSERTION OF ARTERIOVENOUS (AV) GORE-TEX GRAFT UPPER ARM;  Surgeon: Marty Heck, MD;  Location: [facility];  Service: Vascular;  Laterality: Left;  . BASCILIC VEIN TRANSPOSITION Left 09/29/2018   Procedure: BASILIC VEIN TRANSPOSITION SECOND STAGE LEFT UPPER ARM;  Surgeon: Marty Heck, MD;  Location: [facility];  Service: Vascular;  Laterality: Left;  . HEMATOMA EVACUATION Left 03/06/2018   Procedure: EVACUATION HEMATOMA LEFT ARM;  Surgeon: Angelia Mould, MD;  Location: [facility];  Service: Vascular;  Laterality: Left;  . I&D EXTREMITY Left 10/06/2018   Procedure: IRRIGATION AND DEBRIDEMENT ARM;  Surgeon: Marty Heck, MD;  Location: [facility];  Service: Vascular;  Laterality: Left;  . INSERTION OF DIALYSIS CATHETER Left 04/29/2018   Procedure: INSERTION OF DIALYSIS CATHETER LEFT INTERNAL JUGULAR;  Surgeon: Angelia Mould, MD;  Location: [facility];  Service: Vascular;  Laterality: Left;  . IR FLUORO GUIDE CV LINE RIGHT  02/18/2018  . IR FLUORO GUIDE CV LINE RIGHT  02/26/2018  . IR REMOVAL TUN CV CATH W/O FL  04/27/2018  . IR US GUIDE VASC ACCESS RIGHT  02/18/2018  . TEE WITHOUT CARDIOVERSION N/A 04/29/2018   Procedure: TRANSESOPHAGEAL ECHOCARDIOGRAM (TEE);  Surgeon: Sueanne Margarita, MD;  Location: [place] [facility];  Service: Cardiovascular;  Laterality: N/A;  . VENOGRAM Left 10/27/2018   Procedure: LEFT CENTRAL VENOGRAM;  Surgeon: Marty Heck, MD;  Location[facility] OR;  Service: Vascular;  Laterality: Left;   Family History  Problem Relation Age of Onset  . Autoimmune disease Neg Hx    Social History:  reports that she has been smoking cigarettes. She has a 24.00 pack-year smoking history. She has never used  smokeless tobacco. She reports previous alcohol use. She reports current drug use. Drugs: Cocaine and Heroin.  ROS: As per HPI otherwise negative.  Physical Exam: Vitals:   11/16/18 0749 11/16/18 0800 11/16/18 0815 11/16/18 1029  BP:  123/86 115/81 128/82  Pulse:  (!) 109  (!) 112  Resp:  (!) 21 (!) 22 (!) 28  Temp:    99.2 F (37.3 C)  TempSrc:    Oral  SpO2:  91%  95%  Weight: 59 kg   63.2 kg  Height: 5\' 3"  (1.6 m)   5\' 1"  (1.549 m)     General: Chronically ill appearing female. Intermittently tachypneic but in NAD Head: Normocephalic, atraumatic, sclera non-icteric, mucus membranes are moist. Neck: Supple without lymphadenopathy/masses. JVD elevated. Lungs: Decreased breath sounds bilaterally. Clear bilaterally to auscultation without wheezes, rales, or rhonchi.  Heart: RRR with normal S1, S2. No murmurs, rubs, or gallops appreciated. Abdomen: Soft, non-tender, non-distended with normoactive bowel sounds. No rebound/guarding. No obvious abdominal masses. Musculoskeletal:  Strength and tone appear normal for age. Lower extremities: No edema or ischemic changes, no open wounds. Neuro: Alert and oriented X 3. Moves all extremities spontaneously. Psych:  Responds to questions appropriately with a normal affect. Dialysis Access: [facility] without erythema or drainage noted. Maturing LUE AVG +thrill  Allergies  Allergen Reactions  . Acetaminophen Nausea And Vomiting    Patient states Acetaminophen and Acetaminophen containing products make her nauseated. She demonstrated this 06/21/18 with nausea followed by emesis.  Marland Kitchen Hydroxyzine Itching   Prior to Admission medications   Medication Sig Start Date End Date Taking? Authorizing Provider  amLODipine (NORVASC) 5 MG tablet Take 1 tablet (5 mg total) by mouth at bedtime. 10/13/18  Yes Alphonzo Grieve, MD  B Complex-C-Zn-Folic Acid (DIALYVITE 096-EAVW 15) 0.8 MG TABS Take 1 tablet by mouth daily. 11/04/18  Yes [provider]   calcitRIOL (ROCALTROL) 0.25 MCG capsule Take 1 capsule (0.25 mcg total) by mouth every Monday, Wednesday, and Friday with hemodialysis. 10/29/18  Yes Isabelle Course, MD  insulin aspart (NOVOLOG) 100 UNIT/ML injection Inject 5 Units into the skin 3 (three) times daily with meals. Patient taking differently: Inject 3 Units into the skin 3 (three) times daily with meals.  10/13/18  Yes Alphonzo Grieve, MD  insulin glargine (LANTUS) 100 UNIT/ML injection Inject 0.03 mLs (3 Units total) into the skin at bedtime. Patient taking differently: Inject 5 Units into the skin at bedtime.  10/29/18  Yes Isabelle Course, MD  multivitamin (RENA-VIT) TABS tablet Take 1 tablet by mouth at bedtime. 10/13/18  Yes Alphonzo Grieve, MD  Blood Glucose Monitoring Suppl (ACCU-CHEK AVIVA) device Use as instructed 10/29/18 10/29/19  Isabelle Course, MD  glucose blood (ACCU-CHEK AVIVA) test strip Use twice per day. 10/29/18   Isabelle Course, MD  Insulin Syringe-Needle U-100 (INSULIN SYRINGE .3CC/31GX5/16") 31G X 5/16" 0.3 ML MISC Inject 1 each into the skin 4 (four) times daily. 06/27/18   Clent Demark, PA-C  Lancets (ACCU-CHEK SOFT [facility]) lancets Use twice per day. 10/29/18   Isabelle Course, MD  Current Facility-Administered Medications  Medication Dose Route Frequency Provider Last Rate Last Dose  . albuterol (PROVENTIL) (2.5 MG/3ML) 0.083% nebulizer solution 2.5 mg  2.5 mg Nebulization Q2H PRN Barb Merino, MD      . azithromycin (ZITHROMAX) tablet 500 mg  500 mg Oral Q24H Barb Merino, MD   500 mg at 11/16/18 1055  . cefTRIAXone (ROCEPHIN) 1 g in sodium chloride 0.9 % 100 mL IVPB  1 g Intravenous Q24H Barb Merino, MD 200 mL/hr at 11/16/18 1100 1 g at 11/16/18 1100  . Chlorhexidine Gluconate Cloth 2 % PADS 6 each  6 each Topical Q0600 Collins, Samantha G, PA-C      . heparin injection 5,000 Units  5,000 Units Subcutaneous Q8H Barb Merino, MD      . ibuprofen (ADVIL,MOTRIN) tablet 400 mg  400 mg Oral Q6H PRN Barb Merino, MD   400 mg at 11/16/18 1055  . insulin aspart (novoLOG) injection 0-5 Units  0-5 Units Subcutaneous QHS Ghimire, Dante Gang, MD      . insulin aspart (novoLOG) injection 0-9 Units  0-9 Units Subcutaneous TID WC Ghimire, Dante Gang, MD      . mupirocin ointment (BACTROBAN) 2 % 1 application  1 application Nasal BID Barb Merino, MD       Labs: Basic Metabolic Panel: Recent Labs  Lab 11/16/18 0349  NA 138  K 3.9  CL 106  CO2 17*  GLUCOSE 173*  BUN 43*  CREATININE 9.84*  CALCIUM 7.3*   Liver Function Tests: Recent Labs  Lab 11/16/18 0349  AST 31  ALT 16  ALKPHOS 83  BILITOT 1.0  PROT 6.4*  ALBUMIN 2.6*   Recent Labs  Lab 11/16/18 0349  LIPASE 38   No results for input(s): AMMONIA in the last 168 hours. CBC: Recent Labs  Lab 11/16/18 0349  WBC 14.6*  HGB 7.1*  HCT 23.7*  MCV 78.2*  PLT 244   Studies/Results: Dg Chest Port 1 View  Result Date: 11/16/2018 CLINICAL DATA:  Nausea and vomiting with cough EXAM: PORTABLE CHEST 1 VIEW COMPARISON:  10/24/2018 FINDINGS: Cardiac shadow is enlarged. Aortic calcifications are again seen. Dialysis catheter is again noted on the right. Diffuse alveolar opacities are identified consistent with acute pulmonary edema no focal effusion is seen. Slight increased density is noted in the right base which may represent a superimposed infiltrate. IMPRESSION: Changes consistent with pulmonary edema bilaterally with likely superimposed infiltrate in the right base. Electronically Signed   By: Inez Catalina M.D.   On: 11/16/2018 05:01    Dialysis Orders: Center: [facility]  on TTS . Time: 4 hours, 180NRe, BFR 400/DFR 800, EDW 59.5kg 2K, 2.5 Ca bath. TDC; LUE AVG (maturing) Heparin: 3000 unit bolus Mircera: 159mcg IV q 2 weeks (last dose 2/25) Venofer 100mg  IV q HD x 5 doses (1 dose given 3/5) Hectorol 4 mcg IV q HD  Recent labs:  3/5: Hgb 8.4, BUN 51, Ca 6.6, Phos 9.1 2/20: Tsat 27, Ferritin 1559, K  4.9  Assessment/Plan: 1.  Cough/SOB: Chest x-ray consistent with component of pulmonary edema, appears volume overloaded on exam. Will plan for dialysis today ASAP for fluid management. Also possible component of bacterial pneumonia. Antibiotics per primary team. Patient seems to be trying to make her HD sessions but complains of "cramping" too much. She doesn't understand the role of fluid intake and her part in the cramping process. Attempted to educate today. Plan HD later today for pulm edema.  2. Diarrhea: Unclear origin,  no evidence of dehydration. Management per primary team.  3.  ESRD:  Has missed/shortened multiple dialysis treatments lately, now volume overloaded. Reinforced fluid restrictions to prevent pulmonary edema/cramping during dialysis. K 3.9 today.  4.  Hypertension/volume: See above. BP stable. Volume overloaded. Plan for HD today with UF goal of 3L.  5.  Anemia: Hgb 7.1, which is decreased from last outpatient Hgb of 8.4 on 3/5. No overt bleeding reported. On mircera 150 mcg, last dosed 2/25. Also started on course of venofer, received one dose on 3/5. Labs 2/20 showed Tsat 27, ferritin 1559. Follow and consider transfusion after respiratory status is stabilized. 6.  Metabolic bone disease: Ca 7.3, corrected 8.4.  Continue 2.5Ca bath, continue hectorol (will give today since missed dose on 3/7). Last phos 9.1, recommend renal diet. Consider starting binder if phos remains elevated.  7.  Nutrition:  Albumin 2.6, will start pro-stat supplement. Recommend renal diet with fluid restrictions.  8. T2DM: Insulin per primary team  Anice Paganini, PA-C 11/16/2018, 11:07 AM  [facility] Pager: [phone]  Pt seen, examined and agree w assess/plan as above with additions as indicated.  [facility] 11/16/2018, 1:38 PM

## 2018-11-16 NOTE — ED Notes (Signed)
Clear liquid tray ordered for patient.  ?

## 2018-11-16 NOTE — ED Notes (Signed)
Pt offered apple juice while waiting for tray

## 2018-11-16 NOTE — ED Notes (Addendum)
Pt stated she needed to urinate. Assisted pt to restroom. Pt was unable to urinate.

## 2018-11-16 NOTE — Procedures (Signed)
   I was present at this dialysis session, have reviewed the session itself and made  appropriate changes Kelly Splinter MD [facility] pager [phone]   11/16/2018, 4:09 PM

## 2018-11-16 NOTE — ED Provider Notes (Signed)
[facility] EMERGENCY DEPARTMENT Provider Note   CSN: [id number] Arrival date & time: 11/16/18  0309    History   Chief Complaint Chief Complaint  Patient presents with  . Diarrhea  . Shortness of Breath  . Emesis    HPI Rhonda Lynch is a 63 y.o. female. She has history of hypertension, diabetes, stroke, end-stage renal disease on hemodialysis and comes in complaining of diarrhea for the last 3days until about midnight tonight.  Since midnight, she is not had any stool or flatus.  She did start vomiting today.  She also has had a cough for the last 3days.  Cough is been productive of clear sputum.  She denies fever, chills, sweats.  She has noted progressive dyspnea.  She did not go to her dialysis this morning because she felt too bad to go.  Last dialysis was 2 days ago.  He does state that she never gets the full 4 hours of treatment.  The history is provided by the patient.  Diarrhea  Associated symptoms: vomiting   Shortness of Breath  Associated symptoms: vomiting   Emesis  Associated symptoms: diarrhea     Past Medical History:  Diagnosis Date  . Diabetes mellitus without complication ([place])   . ESRD (end stage renal disease) on dialysis ([place])    "TTS; [facility]; they're moving me to another one" (09/22/2018)  . Hepatitis C    diagnosed 09/2018  . Hypertension   . Oxygen deficiency    2 L at night  . Stroke ([place])   . Substance abuse ([place])    has been clean for 4 months     Patient Active Problem List   Diagnosis Date Noted  . ESRD on hemodialysis ([place])   . Hypoglycemia 10/23/2018  . Non-intractable vomiting   . Other ascites   . Palliative care encounter   . Left arm pain   . Chronic hepatitis C virus genotype 1b infection ([place]) 09/28/2018  . ICH (intracerebral hemorrhage) ([place]) small left caudate due to HTN 07/17/2018  . HCAP (healthcare-associated pneumonia) 06/21/2018  . Substance induced mood disorder ([place]) 05/15/2018  .  Agitation   . Hemodialysis catheter infection, initial encounter ([place])   . 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 Pulmonary embolism ([place]) 04/27/2018  . Sepsis ([place]) 04/26/2018  . Dyspnea 04/26/2018  . Chest pain 04/26/2018  . Tachycardia 04/26/2018  . Abdominal pain 04/18/2018  . Type 2 diabetes mellitus ([place]) 04/18/2018  . Right flank pain 04/18/2018  . Acute respiratory failure with hypoxia ([place]) 04/12/2018  . SVT (supraventricular tachycardia) ([place])   . Chronic renal failure   . Diabetes ([facility]) 04/02/2018  . Hypertension   . Non-compliance with renal dialysis ([place])   . Acute pulmonary edema ([facility])   . Elevated troponin 03/19/2018  . Fever   . 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 Diffuse pulmonary alveolar hemorrhage   . Hypoxemia   . Pulmonary infiltrate   . 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 ESRD (end stage renal disease) ([place]) 02/12/2018  . Acute respiratory failure ([place]) 02/12/2018  . Alcohol abuse 02/12/2018  . Microcytic anemia 02/12/2018  . Cocaine abuse ([place]) 02/12/2018  . Epigastric pain 02/12/2018  . Pneumonia 02/12/2018  . TOBACCO ABUSE 03/08/2010  . PNEUMONIA 03/08/2010    Past Surgical History:  Procedure Laterality Date  . AV FISTULA PLACEMENT Left 03/05/2018   Procedure: ARTERIOVENOUS (AV) FISTULA CREATION  LEFT UPPER EXTREMITY;  Surgeon: [name], MD;  [facility];  Service: Vascular;  Laterality: Left;  . AV FISTULA PLACEMENT Left 07/21/2018   Procedure: ARTERIOVENOUS (AV) FISTULA CREATION;  Surgeon: Marty Heck, MD;  Location: [facility];  Service: Vascular;  Laterality: Left;  . AV FISTULA PLACEMENT Left 10/27/2018   Procedure: INSERTION OF ARTERIOVENOUS (AV) GORE-TEX GRAFT UPPER ARM;  Surgeon: Marty Heck, MD;  Location: [facility];  Service: Vascular;  Laterality: Left;  . BASCILIC VEIN TRANSPOSITION Left 09/29/2018   Procedure: BASILIC VEIN TRANSPOSITION SECOND STAGE LEFT UPPER ARM;  Surgeon: Marty Heck, MD;   Location: [facility];  Service: Vascular;  Laterality: Left;  . HEMATOMA EVACUATION Left 03/06/2018   Procedure: EVACUATION HEMATOMA LEFT ARM;  Surgeon: Angelia Mould, MD;  Location: [facility];  Service: Vascular;  Laterality: Left;  . I&D EXTREMITY Left 10/06/2018   Procedure: IRRIGATION AND DEBRIDEMENT ARM;  Surgeon: Marty Heck, MD;  Location: [facility];  Service: Vascular;  Laterality: Left;  . INSERTION OF DIALYSIS CATHETER Left 04/29/2018   Procedure: INSERTION OF DIALYSIS CATHETER LEFT INTERNAL JUGULAR;  Surgeon: Angelia Mould, MD;  Location: [facility];  Service: Vascular;  Laterality: Left;  . IR FLUORO GUIDE CV LINE RIGHT  02/18/2018  . IR FLUORO GUIDE CV LINE RIGHT  02/26/2018  . IR REMOVAL TUN CV CATH W/O FL  04/27/2018  . IR US GUIDE VASC ACCESS RIGHT  02/18/2018  . TEE WITHOUT CARDIOVERSION N/A 04/29/2018   Procedure: TRANSESOPHAGEAL ECHOCARDIOGRAM (TEE);  Surgeon: Sueanne Margarita, MD;  Location[facility] ENDOSCOPY;  Service: Cardiovascular;  Laterality: N/A;  . VENOGRAM Left 10/27/2018   Procedure: LEFT CENTRAL VENOGRAM;  Surgeon: Marty Heck, MD;  Location: [facility];  Service: Vascular;  Laterality: Left;     OB History   No obstetric history on file.      Home Medications    Prior to Admission medications   Medication Sig Start Date End Date Taking? Authorizing Provider  amLODipine (NORVASC) 5 MG tablet Take 1 tablet (5 mg total) by mouth at bedtime. 10/13/18   Alphonzo Grieve, MD  Blood Glucose Monitoring Suppl (ACCU-CHEK AVIVA) device Use as instructed 10/29/18 10/29/19  Isabelle Course, MD  calcitRIOL (ROCALTROL) 0.25 MCG capsule Take 1 capsule (0.25 mcg total) by mouth every Monday, Wednesday, and Friday with hemodialysis. 10/29/18   Isabelle Course, MD  glucose blood (ACCU-CHEK AVIVA) test strip Use twice per day. 10/29/18   Isabelle Course, MD  insulin aspart (NOVOLOG) 100 UNIT/ML injection Inject 5 Units into the skin 3 (three) times daily with meals. 10/13/18   Alphonzo Grieve, MD  insulin glargine (LANTUS) 100 UNIT/ML injection Inject 0.03 mLs (3 Units total) into the skin at bedtime. 10/29/18   Isabelle Course, MD  Insulin Syringe-Needle U-100 (INSULIN SYRINGE .3CC/31GX5/16") 31G X 5/16" 0.3 ML MISC Inject 1 each into the skin 4 (four) times daily. 06/27/18   Clent Demark, PA-C  Lancets (ACCU-CHEK [facility]FT [facility]) lancets Use twice per day. 10/29/18   Isabelle Course, MD  multivitamin (RENA-VIT) TABS tablet Take 1 tablet by mouth at bedtime. 10/13/18   Alphonzo Grieve, MD    Family History Family History  Problem Relation Age of Onset  . Autoimmune disease Neg Hx     Social History Social History   Tobacco Use  . Smoking status: Current Every Day Smoker    Packs/day: 0.50    Years: 48.00    Pack years: 24.00    Types: Cigarettes  . Smokeless tobacco:  Never Used  . Tobacco comment: trying to quit ; smoking 1 1/2 cigarettes/day  (09/22/2018)  Substance Use Topics  . Alcohol use: Not Currently    Comment: 09/22/2018 "stopped about 6 months ago"  . Drug use: Yes    Types: Cocaine, Heroin    Comment: See notes     Allergies   Acetaminophen and Hydroxyzine   Review of Systems Review of Systems  Respiratory: Positive for shortness of breath.   Gastrointestinal: Positive for diarrhea and vomiting.  All other systems reviewed and are negative.    Physical Exam Updated Vital Signs BP 132/85   Pulse (!) 121   Temp 98.4 F (36.9 C) (Oral)   Resp (!) 30   SpO2 (!) 80%   Physical Exam Vitals signs and nursing note reviewed.    63 year old female, appears dyspneic, but is in no acute distress. Vital signs are significant for rapid heart rate and respiratory rate. Oxygen saturation is 80%, which is hypoxic. Head is normocephalic and atraumatic. PERRLA, EOMI. Oropharynx is clear. Neck is nontender and supple without adenopathy or JVD. Back is nontender and there is no CVA tenderness. Lungs are clear without rales, wheezes, or rhonchi. Chest  is nontender.  Dialysis access catheters are present in the right subclavian area. Heart has regular rate and rhythm without murmur. Abdomen is soft, flat, nontender without masses or hepatosplenomegaly and peristalsis is normoactive. Extremities have no cyanosis or edema, full range of motion is present.  Clotted AV fistula is present in the left arm. Skin is warm and dry without rash. Neurologic: Mental status is normal, cranial nerves are intact, there are no motor or sensory deficits.  ED Treatments / Results  Labs (all labs ordered are listed, but only abnormal results are displayed) Labs Reviewed  COMPREHENSIVE METABOLIC PANEL - Abnormal; Notable for the following components:      Result Value   CO2 17 (*)    Glucose, Bld 173 (*)    BUN 43 (*)    Creatinine, Ser 9.84 (*)    Calcium 7.3 (*)    Total Protein 6.4 (*)    Albumin 2.6 (*)    GFR calc non Af Amer 4 (*)    GFR calc Af Amer 4 (*)    All other components within normal limits  CBC - Abnormal; Notable for the following components:   WBC 14.6 (*)    RBC 3.03 (*)    Hemoglobin 7.1 (*)    HCT 23.7 (*)    MCV 78.2 (*)    MCH 23.4 (*)    RDW 24.2 (*)    All other components within normal limits  LIPASE, BLOOD  URINALYSIS, ROUTINE W REFLEX MICROSCOPIC    EKG ARBADELLA, KIMBLER LH:[id number] 16-Nov-2018 02:16:15 [facility]/ED ROUTINE RECORD Sinus tachycardia Left anterior fascicular block Minimal voltage criteria for LVH, may be normal variant Nonspecific T wave abnormality Abnormal ECG 71mm/s 42mm/mV 100Hz  9.0.4 12SL 241 HD CID: 45 Referred by: SELF Unconfirmed Vent. rate 119 BPM PR interval 118 ms QRS duration 72 ms QT/QTc 342/481 ms P-R-T axes 44 -54 56 1956-05-24 (63 yr) Female Black Room:A03 Loc:11 Technician: [phone] Test OMB:TDHR  Radiology Dg Chest Port 1 View  Result Date: 11/16/2018 CLINICAL DATA:  Nausea and vomiting with cough EXAM: PORTABLE CHEST 1 VIEW COMPARISON:  10/24/2018  FINDINGS: Cardiac shadow is enlarged. Aortic calcifications are again seen. Dialysis catheter is again noted on the right. Diffuse alveolar opacities are identified consistent with acute pulmonary  edema no focal effusion is seen. Slight increased density is noted in the right base which may represent a superimposed infiltrate. IMPRESSION: Changes consistent with pulmonary edema bilaterally with likely superimposed infiltrate in the right base. Electronically Signed   By: Inez Catalina M.D.   On: 11/16/2018 05:01    Procedures Procedures  CRITICAL CARE Performed by: Delora Fuel Total critical care time: 40 minutes Critical care time was exclusive of separately billable procedures and treating other patients. Critical care was necessary to treat or prevent imminent or life-threatening deterioration. Critical care was time spent personally by me on the following activities: development of treatment plan with patient and/or surrogate as well as nursing, discussions with consultants, evaluation of patient's response to treatment, examination of patient, obtaining history from patient or surrogate, ordering and performing treatments and interventions, ordering and review of laboratory studies, ordering and review of radiographic studies, pulse oximetry and re-evaluation of patient's condition.  Medications Ordered in ED Medications  cefTRIAXone (ROCEPHIN) 1 g in sodium chloride 0.9 % 100 mL IVPB (1 g Intravenous New Bag/Given 11/16/18 0529)  azithromycin (ZITHROMAX) 500 mg in sodium chloride 0.9 % 250 mL IVPB (has no administration in time range)  sodium chloride flush (NS) 0.9 % injection 3 mL (3 mLs Intravenous Given 11/16/18 0447)  ondansetron (ZOFRAN) injection 4 mg (4 mg Intravenous Given 11/16/18 0446)  morphine 4 MG/ML injection 4 mg (4 mg Intravenous Given 11/16/18 0446)     Initial Impression / Assessment and Plan / ED Course  I have reviewed the triage vital signs and the nursing  notes.  Pertinent labs & imaging results that were available during my care of the patient were reviewed by me and considered in my medical decision making (see chart for details).  Dyspnea and hypoxia.  No findings suggestive of heart failure, but will check chest x-ray.  Probable bronchitis/pneumonia.  ECG shows no acute process, unchanged from recent.  Will check screening labs to make sure no hyperkalemia.  Of note, with supplemental oxygen, oxygen saturation has come up to 95%.  Old records are reviewed, and it is noted that she has a history of noncompliance, hospitalized in January and left AGAINST MEDICAL ADVICE.  Chest x-ray shows pulmonary edema with probable underlying right lower lobe infiltrate.  She is started on antibiotics for community-acquired pneumonia.  Labs show stable anemia.  No hyperkalemia.  She will probably need urgent dialysis today, but does not need it emergently.  Case is discussed with Dr. Alcario Drought of [facility] hospitalist, who agrees to admit the patient.  Final Clinical Impressions(s) / ED Diagnoses   Final diagnoses:  Community acquired pneumonia of right lower lobe of lung ([place])  Acute pulmonary edema ([place])  End-stage renal disease on hemodialysis ([place])  Microcytic anemia    ED Discharge Orders    None       Delora Fuel, MD 78/67/54 [phone]

## 2018-11-16 NOTE — ED Notes (Signed)
1st attempt to call report to [phone], RN in room with critical pt and will call back when ready.

## 2018-11-16 NOTE — H&P (Signed)
History and Physical    Rhonda Lynch:[id number] DOB: Feb 02, 1956 DOA: 11/16/2018  PCP: Clent Demark, PA-C  Patient coming from: Home.    I have personally briefly reviewed patient's old medical records available.   Chief Complaint: Nausea vomiting and abdominal pain.  Shortness of breath and coughing.  HPI: Rhonda Lynch is a 63 y.o. 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  According to the patient, she went to hemodialysis last Saturday, she missed Tuesday because her aunt had heart attack and she had to be there, she went yesterday and did not finish the treatment.  According to the patient,She is having frequent loose stool, watery, associated with abdominal pain that is diffuse, moderate in intensity.  She had some vomiting yesterday.  She also has cough for last 3 days.  Mostly clear mucoid sputum. Has progressively shortness of breath.  Denies any fever chills.  Denies any headache. Patient also had recently placed AV fistula on the left arm and complains of pain. ED Course: Slightly tachypneic.  Oxygen saturations more less than 90% and needed 2 L of oxygen in the ER.  WBC 14.6.  Hemoglobin 7.1 which is at about her baseline.  Creatinine 9.84.  Blood sugars are adequate.  Chest x-ray shows bilateral pulmonary edema fluffy opacities,Somehow asymmetrical on the right base consistent with infiltrates. Patient was given IV antibiotics for pneumonia in the ER.  No blood cultures were drawn.  Review of Systems: As per HPI otherwise 10 point review of systems negative.    Past Medical History:  Diagnosis Date  . Diabetes mellitus without  complication ([place])   . ESRD (end stage renal disease) on dialysis ([place])    "TTS; [facility]; they're moving me to another one" (09/22/2018)  . Hepatitis C    diagnosed 09/2018  . Hypertension   . Oxygen deficiency    2 L at night  . Stroke ([place])   . Substance abuse ([place])    has been clean for 4 months     Past Surgical History:  Procedure Laterality Date  . AV FISTULA PLACEMENT Left 03/05/2018   Procedure: ARTERIOVENOUS (AV) FISTULA CREATION  LEFT UPPER EXTREMITY;  Surgeon: [name], MD;  Location: [facility];  Service: Vascular;  Laterality: Left;  . AV FISTULA PLACEMENT Left 07/21/2018   Procedure: ARTERIOVENOUS (AV) FISTULA CREATION;  Surgeon: Marty Heck, MD;  Location: [facility];  Service: Vascular;  Laterality: Left;  . AV FISTULA PLACEMENT Left 10/27/2018   Procedure: INSERTION OF ARTERIOVENOUS (AV) GORE-TEX GRAFT UPPER ARM;  Surgeon: Marty Heck, MD;  Location: [facility];  Service: Vascular;  Laterality: Left;  . BASCILIC VEIN TRANSPOSITION Left 09/29/2018   Procedure: BASILIC VEIN TRANSPOSITION SECOND STAGE LEFT UPPER ARM;  Surgeon: Marty Heck, MD;  Location: [facility];  Service: Vascular;  Laterality: Left;  . HEMATOMA EVACUATION Left 03/06/2018   Procedure: EVACUATION HEMATOMA LEFT ARM;  Surgeon: Angelia Mould, MD;  Location: [facility];  Service: Vascular;  Laterality: Left;  . I&D EXTREMITY Left 10/06/2018   Procedure: IRRIGATION AND DEBRIDEMENT ARM;  Surgeon: Marty Heck, MD;  Location: [facility];  Service: Vascular;  Laterality: Left;  . INSERTION OF DIALYSIS CATHETER Left 04/29/2018  Procedure: INSERTION OF DIALYSIS CATHETER LEFT INTERNAL JUGULAR;  Surgeon: Angelia Mould, MD;  Location: [place];  Service: Vascular;  Laterality: Left;  . IR FLUORO GUIDE CV LINE RIGHT  02/18/2018  . IR FLUORO GUIDE CV LINE RIGHT  02/26/2018  . IR REMOVAL TUN CV CATH W/O FL  04/27/2018  . IR US GUIDE VASC ACCESS RIGHT  02/18/2018  . TEE WITHOUT CARDIOVERSION N/A  04/29/2018   Procedure: TRANSESOPHAGEAL ECHOCARDIOGRAM (TEE);  Surgeon: Sueanne Margarita, MD;  Location: [facility] ENDOSCOPY;  Service: Cardiovascular;  Laterality: N/A;  . VENOGRAM Left 10/27/2018   Procedure: LEFT CENTRAL VENOGRAM;  Surgeon: Marty Heck, MD;  Location: [place];  Service: Vascular;  Laterality: Left;     reports that she has been smoking cigarettes. She has a 24.00 pack-year smoking history. She has never used smokeless tobacco. She reports previous alcohol use. She reports current drug use. Drugs: Cocaine and Heroin.  Allergies  Allergen Reactions  . Acetaminophen Nausea And Vomiting    Patient states Acetaminophen and Acetaminophen containing products make her nauseated. She demonstrated this 06/21/18 with nausea followed by emesis.  Marland Kitchen Hydroxyzine Itching    Family History  Problem Relation Age of Onset  . Autoimmune disease Neg Hx      Prior to Admission medications   Medication Sig Start Date End Date Taking? Authorizing Provider  amLODipine (NORVASC) 5 MG tablet Take 1 tablet (5 mg total) by mouth at bedtime. 10/13/18   Alphonzo Grieve, MD  Blood Glucose Monitoring Suppl (ACCU-CHEK AVIVA) device Use as instructed 10/29/18 10/29/19  Isabelle Course, MD  calcitRIOL (ROCALTROL) 0.25 MCG capsule Take 1 capsule (0.25 mcg total) by mouth every Monday, Wednesday, and Friday with hemodialysis. 10/29/18   Isabelle Course, MD  glucose blood (ACCU-CHEK AVIVA) test strip Use twice per day. 10/29/18   Isabelle Course, MD  insulin aspart (NOVOLOG) 100 UNIT/ML injection Inject 5 Units into the skin 3 (three) times daily with meals. 10/13/18   Alphonzo Grieve, MD  insulin glargine (LANTUS) 100 UNIT/ML injection Inject 0.03 mLs (3 Units total) into the skin at bedtime. 10/29/18   Isabelle Course, MD  Insulin Syringe-Needle U-100 (INSULIN SYRINGE .3CC/31GX5/16") 31G X 5/16" 0.3 ML MISC Inject 1 each into the skin 4 (four) times daily. 06/27/18   Clent Demark, PA-C  Lancets (ACCU-CHEK SOFT  [facility]) lancets Use twice per day. 10/29/18   Isabelle Course, MD  multivitamin (RENA-VIT) TABS tablet Take 1 tablet by mouth at bedtime. 10/13/18   Alphonzo Grieve, MD    Physical Exam: Vitals:   11/16/18 0745 11/16/18 0749 11/16/18 0800 11/16/18 0815  BP: 113/73  123/86 115/81  Pulse: (!) 107  (!) 109   Resp: 20  (!) 21 (!) 22  Temp:      TempSrc:      SpO2: 100%  91%   Weight:  59 kg    Height:  5\' 3"  (1.6 m)      Constitutional: NAD, calm, comfortable Vitals:   11/16/18 0745 11/16/18 0749 11/16/18 0800 11/16/18 0815  BP: 113/73  123/86 115/81  Pulse: (!) 107  (!) 109   Resp: 20  (!) 21 (!) 22  Temp:      TempSrc:      SpO2: 100%  91%   Weight:  59 kg    Height:  5\' 3"  (1.6 m)     Eyes: PERRL, lids and conjunctivae normal Chronically sick looking.  Not in any acute distress.  On 2 L of oxygen. ENMT: Mucous membranes are moist. Posterior pharynx clear of any exudate or lesions.Normal dentition.  Neck: normal, supple, no masses, no thyromegaly Respiratory: Mostly conducted airway sounds.  Occasional expiratory wheezing. Cardiovascular: Regular rate and rhythm, no murmurs / rubs / gallops. No extremity edema. 2+ pedal pulses. No carotid bruits.  Right chest permacath. Abdomen: Mild diffuse tenderness, no rigidity or guarding, no masses palpated. No hepatosplenomegaly. Bowel sounds positive.  Musculoskeletal: no clubbing / cyanosis. No joint deformity upper and lower extremities. Good ROM, no contractures. Normal muscle tone.  Skin: no rashes, lesions, ulcers. No induration Neurologic: CN 2-12 grossly intact. Sensation intact, DTR normal. Strength 5/5 in all 4.  Psychiatric: Normal judgment and insight. Alert and oriented x 3.  Anxious mood and flat affect. Left arm AV fistula with clean and dry surgical scar, distal pulses are palpable, slightly tender on palpation but no underlying fluctuation.   Labs on Admission: I have personally reviewed following labs and imaging  studies  CBC: Recent Labs  Lab 11/16/18 0349  WBC 14.6*  HGB 7.1*  HCT 23.7*  MCV 78.2*  PLT 536   Basic Metabolic Panel: Recent Labs  Lab 11/16/18 0349  NA 138  K 3.9  CL 106  CO2 17*  GLUCOSE 173*  BUN 43*  CREATININE 9.84*  CALCIUM 7.3*   GFR: Estimated Creatinine Clearance: 4.7 mL/min (A) (by C-G formula based on SCr of 9.84 mg/dL (H)). Liver Function Tests: Recent Labs  Lab 11/16/18 0349  AST 31  ALT 16  ALKPHOS 83  BILITOT 1.0  PROT 6.4*  ALBUMIN 2.6*   Recent Labs  Lab 11/16/18 0349  LIPASE 38   No results for input(s): AMMONIA in the last 168 hours. Coagulation Profile: No results for input(s): INR, PROTIME in the last 168 hours. Cardiac Enzymes: No results for input(s): CKTOTAL, CKMB, CKMBINDEX, TROPONINI in the last 168 hours. BNP (last 3 results) No results for input(s): PROBNP in the last 8760 hours. HbA1C: No results for input(s): HGBA1C in the last 72 hours. CBG: No results for input(s): GLUCAP in the last 168 hours. Lipid Profile: No results for input(s): CHOL, HDL, LDLCALC, TRIG, CHOLHDL, LDLDIRECT in the last 72 hours. Thyroid Function Tests: No results for input(s): TSH, T4TOTAL, FREET4, T3FREE, THYROIDAB in the last 72 hours. Anemia Panel: No results for input(s): VITAMINB12, FOLATE, FERRITIN, TIBC, IRON, RETICCTPCT in the last 72 hours. Urine analysis:    Component Value Date/Time   COLORURINE AMBER (A) 05/06/2018 1523   APPEARANCEUR HAZY (A) 05/06/2018 1523   LABSPEC 1.017 05/06/2018 1523   PHURINE 6.0 05/06/2018 1523   GLUCOSEU 50 (A) 05/06/2018 1523   HGBUR LARGE (A) 05/06/2018 1523   HGBUR trace-intact 03/08/2010 1452   BILIRUBINUR NEGATIVE 05/06/2018 1523   KETONESUR NEGATIVE 05/06/2018 1523   PROTEINUR >=300 (A) 05/06/2018 1523   UROBILINOGEN 0.2 03/08/2010 1452   NITRITE NEGATIVE 05/06/2018 1523   LEUKOCYTESUR MODERATE (A) 05/06/2018 1523    Radiological Exams on Admission: Dg Chest Port 1 View  Result Date:  11/16/2018 CLINICAL DATA:  Nausea and vomiting with cough EXAM: PORTABLE CHEST 1 VIEW COMPARISON:  10/24/2018 FINDINGS: Cardiac shadow is enlarged. Aortic calcifications are again seen. Dialysis catheter is again noted on the right. Diffuse alveolar opacities are identified consistent with acute pulmonary edema no focal effusion is seen. Slight increased density is noted in the right base which may represent a superimposed infiltrate. IMPRESSION: Changes consistent with pulmonary edema bilaterally with likely superimposed infiltrate in the  right base. Electronically Signed   By: Inez Catalina M.D.   On: 11/16/2018 05:01    EKG: Independently reviewed.  Sinus tachycardia.  No acute ST-T wave changes.  QTC is 481.  Comparable to previous EKGs.  Assessment/Plan Principal Problem:   PNEUMONIA Active Problems:   TOBACCO ABUSE   ESRD (end stage renal disease) (HCC)   Microcytic anemia   Pneumonia   Hypertension   Diabetes (H[facility])   Non-compliance with renal dialysis ([place])     1.  Suspected pneumonia: Suspected bacterial pneumonia versus fluid overload Blood cultures were not drawn in the ER, please draw with hemodialysis. Agree with admission given severity of symptoms. Antibiotics to treat bacterial pneumonia, given Rocephin and azithromycin will continue. Chest physiotherapy, incentive spirometry, deep breathing exercises, sputum induction, mucolytic's and bronchodilators. Sputum cultures, blood cultures, streptococcal antigen. Supplemental oxygen to keep saturations more than 90%.  2.  ESRD on hemodialysis, noncompliant.  Patient in need for dialysis in the hospital.  Discussed with nephrology.  3.  Anemia of chronic disease: Chronic and persistent.  Hemoglobin is 7.1.  No indication for transfusion at this time.  4.  Hypertension: Fairly stable.  Resume amlodipine.  5.  Type 2 diabetes: On insulin.  She will resume this.  Patient has history of DVT PE.  She had intracranial hemorrhage  and has been off anticoagulation since then.  She is very noncompliant.  DVT prophylaxis: Heparin subcu Code Status: Full code Family Communication: No family at bedside Disposition Plan: Home when is stable Consults called: Nephrology Admission status: Observation   Barb Merino MD [facility] Pager [phone]  If 7PM-7AM, please contact night-coverage [email or web address] Password [facility]  11/16/2018, 8:32 AM

## 2018-11-17 DIAGNOSIS — I150 Renovascular hypertension: Secondary | ICD-10-CM | POA: Diagnosis not present

## 2018-11-17 DIAGNOSIS — F1490 Cocaine use, unspecified, uncomplicated: Secondary | ICD-10-CM | POA: Diagnosis present

## 2018-11-17 DIAGNOSIS — R1013 Epigastric pain: Secondary | ICD-10-CM | POA: Diagnosis present

## 2018-11-17 DIAGNOSIS — J811 Chronic pulmonary edema: Secondary | ICD-10-CM | POA: Diagnosis present

## 2018-11-17 DIAGNOSIS — J810 Acute pulmonary edema: Secondary | ICD-10-CM

## 2018-11-17 DIAGNOSIS — J189 Pneumonia, unspecified organism: Secondary | ICD-10-CM | POA: Diagnosis present

## 2018-11-17 DIAGNOSIS — I444 Left anterior fascicular block: Secondary | ICD-10-CM | POA: Diagnosis present

## 2018-11-17 DIAGNOSIS — D638 Anemia in other chronic diseases classified elsewhere: Secondary | ICD-10-CM | POA: Diagnosis present

## 2018-11-17 DIAGNOSIS — N2581 Secondary hyperparathyroidism of renal origin: Secondary | ICD-10-CM | POA: Diagnosis present

## 2018-11-17 DIAGNOSIS — B1920 Unspecified viral hepatitis C without hepatic coma: Secondary | ICD-10-CM | POA: Diagnosis present

## 2018-11-17 DIAGNOSIS — I120 Hypertensive chronic kidney disease with stage 5 chronic kidney disease or end stage renal disease: Secondary | ICD-10-CM | POA: Diagnosis present

## 2018-11-17 DIAGNOSIS — Z992 Dependence on renal dialysis: Secondary | ICD-10-CM | POA: Diagnosis not present

## 2018-11-17 DIAGNOSIS — N186 End stage renal disease: Secondary | ICD-10-CM

## 2018-11-17 DIAGNOSIS — J9601 Acute respiratory failure with hypoxia: Secondary | ICD-10-CM | POA: Diagnosis present

## 2018-11-17 DIAGNOSIS — D509 Iron deficiency anemia, unspecified: Secondary | ICD-10-CM | POA: Diagnosis present

## 2018-11-17 DIAGNOSIS — R042 Hemoptysis: Secondary | ICD-10-CM | POA: Diagnosis present

## 2018-11-17 DIAGNOSIS — D631 Anemia in chronic kidney disease: Secondary | ICD-10-CM | POA: Diagnosis present

## 2018-11-17 DIAGNOSIS — I776 Arteritis, unspecified: Secondary | ICD-10-CM | POA: Diagnosis present

## 2018-11-17 DIAGNOSIS — E1122 Type 2 diabetes mellitus with diabetic chronic kidney disease: Secondary | ICD-10-CM | POA: Diagnosis present

## 2018-11-17 DIAGNOSIS — J159 Unspecified bacterial pneumonia: Secondary | ICD-10-CM | POA: Diagnosis present

## 2018-11-17 DIAGNOSIS — E8889 Other specified metabolic disorders: Secondary | ICD-10-CM | POA: Diagnosis present

## 2018-11-17 DIAGNOSIS — R197 Diarrhea, unspecified: Secondary | ICD-10-CM | POA: Diagnosis present

## 2018-11-17 DIAGNOSIS — F17210 Nicotine dependence, cigarettes, uncomplicated: Secondary | ICD-10-CM | POA: Diagnosis present

## 2018-11-17 DIAGNOSIS — D649 Anemia, unspecified: Secondary | ICD-10-CM | POA: Diagnosis present

## 2018-11-17 DIAGNOSIS — Z8673 Personal history of transient ischemic attack (TIA), and cerebral infarction without residual deficits: Secondary | ICD-10-CM | POA: Diagnosis not present

## 2018-11-17 DIAGNOSIS — E119 Type 2 diabetes mellitus without complications: Secondary | ICD-10-CM | POA: Diagnosis present

## 2018-11-17 DIAGNOSIS — Z794 Long term (current) use of insulin: Secondary | ICD-10-CM | POA: Diagnosis not present

## 2018-11-17 LAB — TYPE AND SCREEN
ABO/RH(D): A POS
ANTIBODY SCREEN: NEGATIVE
Unit division: 0
Unit division: 0

## 2018-11-17 LAB — BASIC METABOLIC PANEL
Anion gap: 15 (ref 5–15)
BUN: 17 mg/dL (ref 8–23)
CO2: 22 mmol/L (ref 22–32)
CREATININE: 4.75 mg/dL — AB (ref 0.44–1.00)
Calcium: 8 mg/dL — ABNORMAL LOW (ref 8.9–10.3)
Chloride: 98 mmol/L (ref 98–111)
GFR calc Af Amer: 10 mL/min — ABNORMAL LOW (ref >60)
GFR calc non Af Amer: 9 mL/min — ABNORMAL LOW (ref >60)
Glucose, Bld: 117 mg/dL — ABNORMAL HIGH (ref 70–99)
Potassium: 3.9 mmol/L (ref 3.5–5.1)
Sodium: 135 mmol/L (ref 135–145)

## 2018-11-17 LAB — RESPIRATORY PANEL BY PCR
Adenovirus: NOT DETECTED
Bordetella pertussis: NOT DETECTED
Chlamydophila pneumoniae: NOT DETECTED
Coronavirus 229E: NOT DETECTED
Coronavirus HKU1: NOT DETECTED
Coronavirus NL63: NOT DETECTED
Coronavirus OC43: NOT DETECTED
Influenza A: NOT DETECTED
Influenza B: NOT DETECTED
METAPNEUMOVIRUS-RVPPCR: NOT DETECTED
Mycoplasma pneumoniae: NOT DETECTED
PARAINFLUENZA VIRUS 4-RVPPCR: NOT DETECTED
Parainfluenza Virus 1: NOT DETECTED
Parainfluenza Virus 2: NOT DETECTED
Parainfluenza Virus 3: NOT DETECTED
Respiratory Syncytial Virus: NOT DETECTED
Rhinovirus / Enterovirus: NOT DETECTED

## 2018-11-17 LAB — GLUCOSE, CAPILLARY
Glucose-Capillary: 100 mg/dL — ABNORMAL HIGH (ref 70–99)
Glucose-Capillary: 80 mg/dL (ref 70–99)
Glucose-Capillary: 192 mg/dL — ABNORMAL HIGH (ref 70–99)
Glucose-Capillary: 193 mg/dL — ABNORMAL HIGH (ref 70–99)

## 2018-11-17 LAB — CBC
HCT: 30.1 % — ABNORMAL LOW (ref 36.0–46.0)
Hemoglobin: 9.5 g/dL — ABNORMAL LOW (ref 12.0–15.0)
MCH: 25 pg — ABNORMAL LOW (ref 26.0–34.0)
MCHC: 31.6 g/dL (ref 30.0–36.0)
MCV: 79.2 fL — ABNORMAL LOW (ref 80.0–100.0)
Platelets: 237 10*3/uL (ref 150–400)
RBC: 3.8 MIL/uL — ABNORMAL LOW (ref 3.87–5.11)
RDW: 20.8 % — ABNORMAL HIGH (ref 11.5–15.5)
WBC: 12.8 10*3/uL — AB (ref 4.0–10.5)
nRBC: 0.8 % — ABNORMAL HIGH (ref 0.0–0.2)

## 2018-11-17 LAB — BPAM RBC
Blood Product Expiration Date: 202004022359
Blood Product Expiration Date: 202004032359
ISSUE DATE / TIME: 202003081720
ISSUE DATE / TIME: 202003081720
UNIT TYPE AND RH: 6200
Unit Type and Rh: 6200

## 2018-11-17 LAB — HIV ANTIBODY (ROUTINE TESTING W REFLEX): HIV Screen 4th Generation wRfx: NONREACTIVE

## 2018-11-17 LAB — PHOSPHORUS: Phosphorus: 5.1 mg/dL — ABNORMAL HIGH (ref 2.5–4.6)

## 2018-11-17 MED ORDER — TRAZODONE HCL 50 MG PO TABS
50.0000 mg | ORAL_TABLET | Freq: Once | ORAL | Status: AC
  Administered 2018-11-17: 50 mg via ORAL
  Filled 2018-11-17: qty 1

## 2018-11-17 MED ORDER — DARBEPOETIN ALFA 150 MCG/0.3ML IJ SOSY
150.0000 ug | PREFILLED_SYRINGE | INTRAMUSCULAR | Status: DC
  Administered 2018-11-18: 150 ug via INTRAVENOUS
  Filled 2018-11-17: qty 0.3

## 2018-11-17 MED ORDER — RENA-VITE PO TABS
1.0000 | ORAL_TABLET | Freq: Every day | ORAL | Status: DC
  Administered 2018-11-17 – 2018-11-20 (×4): 1 via ORAL
  Filled 2018-11-17 (×4): qty 1

## 2018-11-17 NOTE — Progress Notes (Signed)
PROGRESS NOTE        PATIENT DETAILS Name: Rhonda Lynch Age: 63 y.o. Sex: female Date of Birth: 07/10/1956 Admit Date: 11/16/2018 Admitting Physician Barb Merino, MD NWG:[id number], Lesli Albee, PA-C  Brief Narrative: Patient is a 63 y.o. female with past medical history significant for ESRD on HD, hypertension, type 2 diabetes, history of CVA, hepatitis C, and polysubstance abuse who presented to the ED with chief complaint of abdominal pain and loose, watery stools x 3 days. Associated symptoms include progressive dyspnea, vomiting, and cough productive of clear mucoid sputum. Patient reports she missed dialysis on 3/3 and did not complete the entire session on 3/7 due to cramping. In the ED, patient hypoxic with O2 in 80s, improved to 95% with 2L of oxygen. Chest x-ray with bilateral pulmonary edema and right lower lobe infiltrate suggestive of pneumonia. She was started on IV antibiotics and admitted for urgent dialysis.   Subjective: Patient reports improvement in breathing. No further episodes of vomiting. She complains of abdominal pain of the epigastric region and RUQ. She denies chest pain, SOB, nausea, vomiting.   Assessment/Plan: Principal Problem:   PNEUMONIA Active Problems:   TOBACCO ABUSE   ESRD (end stage renal disease) (HCC)   Microcytic anemia   Pneumonia   Hypertension   Diabetes (HCC)   Non-compliance with renal dialysis ([place])  1. Cough/Shortness of breath  -CXR with bilateral pulmonary edema and right lower lobe infiltrate suggestive of possible component of bacterial pneumonia. Patient reports improvement in breathing with dialysis yesterday. Patient afebrile, elevated WBC count at 12.8 currently on empiric IV ceftriaxone and po zithromax. Preliminary results for blood cultures negative. Strep pneumo urinary antigen result pending. Respiratory panel pending, may consider dc of antibiotics pending results  -Plan for HD today for fluid  management.  -Encourage incentive spirometry, flutter valve   2. Abdominal pain  -Patient with similar pain at time of last admission on 10/23/18. CT abdomen performed 2/13 revealed abdominal ascites and possible enteritis. Lipase negative. LFTs normal. May be secondary to viral etiology with GI symptoms, suspect volume overload is contributing. Will plan for inpatient HD treatment today then reevaluate.     -Continue IV morphine q4h as needed for pain   3. ESRD -Per patient records, patient noncompliant with medications and HD. Unable to complete treatments lately due to cramping during dialysis. Patient received inpatient dialysis 3/8, plan for treatment again today. Cr improved from 10.31 to 4.75. K at 3.9. Will defer further to nephrology.   4. Anemia of chronic disease -Patient received 2 units RBC transfusion 11/16/18. Hemoglobin improved from 6.4 to 9.5. No indication for further transfusion at this time.   5. Hypertension -Normotensive, SBP at 116. Will continue to monitor   6. Type 2 diabetes  -CBG 102 yesterday. Will continue with sliding scale insulin and routine CBG monitoring   Echo (reviewed):EF 50-55% on TTE done on 04/03/18  Old records review: prior d/c summary  Morning labs/Imaging ordered: yes  DVT Prophylaxis:  Prophylactic Heparin   Code Status: Full code  Family Communication: None  Disposition Plan: Remain inpatient pending clinical improvement with dialysis   Antimicrobial agents: Anti-infectives (From admission, onward)   Start     Dose/Rate Route Frequency Ordered Stop   11/16/18 1100  cefTRIAXone (ROCEPHIN) 1 g in sodium chloride 0.9 % 100 mL IVPB     1  g 200 mL/hr over 30 Minutes Intravenous Every 24 hours 11/16/18 1024 11/23/18 1059   11/16/18 1100  azithromycin (ZITHROMAX) tablet 500 mg     500 mg Oral Every 24 hours 11/16/18 1024 11/23/18 1059   11/16/18 0515  cefTRIAXone (ROCEPHIN) 1 g in sodium chloride 0.9 % 100 mL IVPB     1 g 200 mL/hr  over 30 Minutes Intravenous  Once 11/16/18 0513 11/16/18 0630   11/16/18 0515  azithromycin (ZITHROMAX) 500 mg in sodium chloride 0.9 % 250 mL IVPB     500 mg 250 mL/hr over 60 Minutes Intravenous  Once 11/16/18 0513 11/16/18 0746      Procedures: Hemodialysis  CONSULTS: Nephrology   MEDICATIONS: Scheduled Meds: . sodium chloride   Intravenous Once  . azithromycin  500 mg Oral Q24H  . Chlorhexidine Gluconate Cloth  6 each Topical Q0600  . [START ON 11/18/2018] doxercalciferol  4 mcg Intravenous Q T,Th,Sa-HD  . feeding supplement (PRO-STAT SUGAR FREE 64)  30 mL Oral BID  . heparin  5,000 Units Subcutaneous Q8H  . insulin aspart  0-5 Units Subcutaneous QHS  . insulin aspart  0-9 Units Subcutaneous TID WC  . mupirocin ointment  1 application Nasal BID   Continuous Infusions: . cefTRIAXone (ROCEPHIN)  IV Stopped (11/16/18 1135)   PRN Meds:.albuterol, ibuprofen, morphine injection   PHYSICAL EXAM: Vital signs: Vitals:   11/16/18 1830 11/16/18 1845 11/16/18 2156 11/17/18 0402  BP: (!) 154/82 140/83 (!) 148/76 116/71  Pulse: (!) 122 (!) 114 (!) 123 (!) 113  Resp: (!) 22 20 19 16   Temp: 98.2 F (36.8 C) 99.5 F (37.5 C) 98.6 F (37 C) 100.1 F (37.8 C)  TempSrc: Oral Oral Oral Oral  SpO2: 98% 99% 100% (!) 75%  Weight:  60 kg    Height:       Filed Weights   11/16/18 1029 11/16/18 1440 11/16/18 1845  Weight: 63.2 kg 62.6 kg 60 kg   Body mass index is 24.99 kg/m.   General: Awake, alert, not in any distress. Chronically ill appearing  HEENT: [place] mucosa moist  CV: Regular rate and rhythm Pulm: Good air entry bilaterally GI: Pain to palpation over RUQ and epigastric region. No obvious abdominal masses Extremities: No lower extremity edema bilaterally  Neurology: Speech clear, sensation is grossly intact Musculoskeletal: No visible joint deformities  Skin: No rash  LABORATORY DATA: CBC: Recent Labs  Lab 11/16/18 0349 11/16/18 1440 11/17/18 0423  WBC 14.6*  12.8* 12.8*  HGB 7.1* 6.4* 9.5*  HCT 23.7* 21.2* 30.1*  MCV 78.2* 77.9* 79.2*  PLT 244 246 811    Basic Metabolic Panel: Recent Labs  Lab 11/16/18 0349 11/16/18 1440 11/17/18 0423  NA 138 139 135  K 3.9 4.0 3.9  CL 106 103 98  CO2 17* 19* 22  GLUCOSE 173* 112* 117*  BUN 43* 48* 17  CREATININE 9.84* 10.31* 4.75*  CALCIUM 7.3* 7.3* 8.0*  PHOS  --  7.2* 5.1*    GFR: Estimated Creatinine Clearance: 9.8 mL/min (A) (by C-G formula based on SCr of 4.75 mg/dL (H)).  Liver Function Tests: Recent Labs  Lab 11/16/18 0349 11/16/18 1440  AST 31  --   ALT 16  --   ALKPHOS 83  --   BILITOT 1.0  --   PROT 6.4*  --   ALBUMIN 2.6* 2.4*   Recent Labs  Lab 11/16/18 0349  LIPASE 38   No results for input(s): AMMONIA in the last 168 hours.  Coagulation Profile: No results for input(s): INR, PROTIME in the last 168 hours.  Cardiac Enzymes: No results for input(s): CKTOTAL, CKMB, CKMBINDEX, TROPONINI in the last 168 hours.  BNP (last 3 results) No results for input(s): PROBNP in the last 8760 hours.  HbA1C: No results for input(s): HGBA1C in the last 72 hours.  CBG: Recent Labs  Lab 11/16/18 1137 11/16/18 2232 11/17/18 0913  GLUCAP 134* 102* 100*    Lipid Profile: No results for input(s): CHOL, HDL, LDLCALC, TRIG, CHOLHDL, LDLDIRECT in the last 72 hours.  Thyroid Function Tests: No results for input(s): TSH, T4TOTAL, FREET4, T3FREE, THYROIDAB in the last 72 hours.  Anemia Panel: No results for input(s): VITAMINB12, FOLATE, FERRITIN, TIBC, IRON, RETICCTPCT in the last 72 hours.  Urine analysis:    Component Value Date/Time   COLORURINE AMBER (A) 05/06/2018 1523   APPEARANCEUR HAZY (A) 05/06/2018 1523   LABSPEC 1.017 05/06/2018 1523   PHURINE 6.0 05/06/2018 1523   GLUCOSEU 50 (A) 05/06/2018 1523   HGBUR LARGE (A) 05/06/2018 1523   HGBUR trace-intact 03/08/2010 1452   BILIRUBINUR NEGATIVE 05/06/2018 1523   KETONESUR NEGATIVE 05/06/2018 1523   PROTEINUR  >=300 (A) 05/06/2018 1523   UROBILINOGEN 0.2 03/08/2010 1452   NITRITE NEGATIVE 05/06/2018 1523   LEUKOCYTESUR MODERATE (A) 05/06/2018 1523    Sepsis Labs: Lactic Acid, Venous    Component Value Date/Time   LATICACIDVEN 1.30 06/20/2018 2215    MICROBIOLOGY: Recent Results (from the past 240 hour(s))  Surgical PCR screen     Status: None   Collection Time: 11/16/18 11:06 AM  Result Value Ref Range Status   MRSA, PCR NEGATIVE NEGATIVE Final   Staphylococcus aureus NEGATIVE NEGATIVE Final    Comment: (NOTE) The Xpert SA Assay (FDA approved for NASAL specimens in patients 59 years of age and older), is one component of a comprehensive surveillance program. It is not intended to diagnose infection nor to guide or monitor treatment. Performed at [facility], [place] [address], [place], [place] [address]   Culture, blood (routine x 2) Call MD if unable to obtain prior to antibiotics being given     Status: None (Preliminary result)   Collection Time: 11/16/18 12:31 PM  Result Value Ref Range Status   Specimen Description BLOOD RIGHT ANTECUBITAL  Final   Special Requests   Final    BOTTLES DRAWN AEROBIC ONLY Blood Culture adequate volume   Culture   Final    NO GROWTH < 24 HOURS Performed at [facility], [address] [place], [place] [address]    Report Status PENDING  Incomplete  Culture, blood (routine x 2) Call MD if unable to obtain prior to antibiotics being given     Status: None (Preliminary result)   Collection Time: 11/16/18 12:38 PM  Result Value Ref Range Status   Specimen Description BLOOD RIGHT FOREARM  Final   Special Requests   Final    BOTTLES DRAWN AEROBIC ONLY Blood Culture adequate volume   Culture   Final    NO GROWTH < 24 HOURS Performed at [facility], [address], [place], [place] [address]    Report Status PENDING  Incomplete    RADIOLOGY STUDIES/RESULTS: Dg Chest 2 View  Result Date: 10/24/2018 CLINICAL DATA:  Shortness  of breath. Right lower lobe consolidation. EXAM: CHEST - 2 VIEW COMPARISON:  Radiograph October 23, 2018. FINDINGS: Stable cardiomegaly. No pneumothorax or pleural effusion is noted. Stable position of right internal jugular dialysis  catheter. Left lung is clear. Stable opacity seen laterally in right lung base consistent with scarring, infiltrate or atelectasis. The visualized skeletal structures are unremarkable. IMPRESSION: Stable right basilar opacity as noted on prior exam. Potentially this may represent pneumonia, atelectasis or scarring. Followup PA and lateral chest X-ray is recommended in 3-4 weeks following trial of antibiotic therapy to ensure resolution and exclude underlying malignancy. Electronically Signed   By: Marijo Conception, M.D.   On: 10/24/2018 11:36   Ct Abdomen Pelvis W Contrast  Result Date: 10/23/2018 CLINICAL DATA:  Abdominal cramping and distension with 1 episode of vomiting. EXAM: CT ABDOMEN AND PELVIS WITH CONTRAST TECHNIQUE: Multidetector CT imaging of the abdomen and pelvis was performed using the standard protocol following bolus administration of intravenous contrast. CONTRAST:  175mL OMNIPAQUE IOHEXOL 300 MG/ML  SOLN COMPARISON:  06/21/2018 FINDINGS: Lower chest: Scarring versus atelectasis in the bilateral lower lobes, right greater than left. Hepatobiliary: No focal liver abnormality is seen. Status post cholecystectomy. No biliary dilatation. Pancreas: Unremarkable. No pancreatic ductal dilatation or surrounding inflammatory changes. Spleen: Hypoattenuated mass within the spleen measures 14 mm, stable. There is also a stable 8 mm cyst in the inferior tip of the spleen. Adrenals/Urinary Tract: Adrenal glands are unremarkable. Kidneys are without renal calculi, focal lesion, or hydronephrosis. No contrast is seen within the collecting system on the delayed images. Bladder is unremarkable. Stomach/Bowel: Indistinctness of the walls of the duodenum without pathologic distension.  Questionable circumferential thickening of the gastric antrum. No evidence of small-bowel obstruction. The colon is decompressed. Normal appendix. Vascular/Lymphatic: Mildly enlarged lymph nodes within the porta hepatis. Stable mildly prominent lymph nodes within the pericardial fat in right cardiophrenic angle. Reproductive: Status post hysterectomy. Fat containing left ovarian mass is stable at 15 mm. Other: Moderate volume abdominopelvic ascites. Water density nodules in the right abdominal wall may represent injection sites. Musculoskeletal: Posterior facet arthropathy in the lower lumbosacral spine. IMPRESSION: Interval development of moderate in volume abdominopelvic ascites. Thickening of the walls of the gastric antrum; indistinct appearance of the duodenum, findings which may be seen with enteritis. Lack of contrast excretion within the renal collecting systems, consistent with renal failure. Stable fat containing mass on the left ovary. Electronically Signed   By: Fidela Salisbury M.D.   On: 10/23/2018 10:43   Dg Chest Port 1 View  Result Date: 11/16/2018 CLINICAL DATA:  Nausea and vomiting with cough EXAM: PORTABLE CHEST 1 VIEW COMPARISON:  10/24/2018 FINDINGS: Cardiac shadow is enlarged. Aortic calcifications are again seen. Dialysis catheter is again noted on the right. Diffuse alveolar opacities are identified consistent with acute pulmonary edema no focal effusion is seen. Slight increased density is noted in the right base which may represent a superimposed infiltrate. IMPRESSION: Changes consistent with pulmonary edema bilaterally with likely superimposed infiltrate in the right base. Electronically Signed   By: Inez Catalina M.D.   On: 11/16/2018 05:01   Dg Chest Portable 1 View  Result Date: 10/23/2018 CLINICAL DATA:  Patient with cough and shortness of breath. EXAM: PORTABLE CHEST 1 VIEW COMPARISON:  Chest radiograph 10/05/2018 FINDINGS: Dual lumen central venous catheter tip projects  over the superior vena cava. Monitoring leads overlie the patient. Stable cardiomegaly. Aortic atherosclerosis. Interval development patchy consolidative opacities right lower lung. Slight interval improvement bilateral interstitial pulmonary opacities. No pleural effusion or pneumothorax. IMPRESSION: Interval development peripheral consolidative opacities right lower lung concerning for infection in the appropriate clinical setting. Followup PA and lateral chest X-ray is recommended in 3-4 weeks following  trial of antibiotic therapy to ensure resolution and exclude underlying malignancy. Electronically Signed   By: Lovey Newcomer M.D.   On: 10/23/2018 09:07     LOS: 0 days   Valentina Shaggy  11/17/2018, 9:18 AM

## 2018-11-17 NOTE — Evaluation (Signed)
Physical Therapy Evaluation Patient Details Name: Rhonda Lynch MRN: [id number] DOB: Sep 14, 1955 Today's Date: 11/17/2018   History of Present Illness  Patient is a 63 y/o female presenting to the ED on 11/16/2018 with primary complaints of N&V and abdominal pain. 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 Admitted for PNA work-up.    Clinical Impression  Patient admitted with the above listed diagnosis. Patient reports Mod I with mobility prior to admission. Patient today requiring Min guard for mobility in room with patient demonstrating mild unsteadiness with tendency to reach out for objects in room for stability. Will currently recommend HHPT at discharge to ensure safety in the home environment. PT to follow acutely.      Follow Up Recommendations Home health PT;Supervision - Intermittent    Equipment Recommendations  None recommended by PT    Recommendations for Other Services       Precautions / Restrictions Precautions Precautions: Fall Restrictions Weight Bearing Restrictions: No      Mobility  Bed Mobility Overal bed mobility: Modified Independent                Transfers Overall transfer level: Needs assistance Equipment used: None Transfers: Sit to/from Stand;Stand Pivot Transfers Sit to Stand: Min guard Stand pivot transfers: Min guard       General transfer comment: for safety and immediate standing balance  Ambulation/Gait Ambulation/Gait assistance: Min guard Gait Distance (Feet): 15 Feet(x2) Assistive device: None Gait Pattern/deviations: Step-through pattern;Decreased stride length;Trunk flexed Gait velocity: decreased   General Gait Details: unsteadiness with gait with patient reaching for objects; not willing to ambulate  in hallway  Stairs            Wheelchair Mobility    Modified Rankin (Stroke Patients Only)       Balance Overall balance assessment: Mild deficits observed, not formally tested                                           Pertinent Vitals/Pain Pain Assessment: Faces Faces Pain Scale: Hurts whole lot Pain Location: abdomen with mobility Pain Descriptors / Indicators: Aching;Discomfort;Guarding;Grimacing Pain Intervention(s): Limited activity within patient's tolerance;Monitored during session;Repositioned;Patient requesting pain meds-RN notified    Home Living Family/patient expects to be discharged to:: Private residence Living Arrangements: Non-relatives/Friends Available Help at Discharge: Friend(s);Available PRN/intermittently Type of Home: House Home Access: Stairs to enter   [employer] of Steps: 2 Home Layout: One level        Prior Function Level of Independence: Independent               Hand Dominance   Dominant Hand: Right    Extremity/Trunk Assessment   Upper Extremity Assessment Upper Extremity Assessment: Defer to OT evaluation    Lower Extremity Assessment Lower Extremity Assessment: Generalized weakness    Cervical / Trunk Assessment Cervical / Trunk Assessment: Normal  Communication   Communication: No difficulties  Cognition Arousal/Alertness: Awake/alert Behavior During Therapy: WFL for tasks assessed/performed Overall Cognitive Status: Within Functional Limits for tasks assessed                                 General Comments:  very agitated when asked to mobilize      General Comments      Exercises     Assessment/Plan    PT Assessment Patient needs continued PT services  PT Problem List Decreased strength;Decreased activity tolerance;Decreased balance;Decreased mobility;Decreased knowledge of use of DME;Decreased safety awareness       PT Treatment Interventions DME  instruction;Gait training;Stair training;Functional mobility training;Therapeutic activities;Therapeutic exercise;Balance training;Patient/family education    PT Goals (Current goals can be found in the Care Plan section)  Acute Rehab PT Goals Patient Stated Goal: reduce pain PT Goal Formulation: With patient Time For Goal Achievement: 12/01/18 Potential to Achieve Goals: Good    Frequency Min 3X/week   Barriers to discharge        Co-evaluation               AM-PAC PT "6 Clicks" Mobility  Outcome Measure Help needed turning from your back to your side while in a flat bed without using bedrails?: A Little Help needed moving from lying on your back to sitting on the side of a flat bed without using bedrails?: A Little Help needed moving to and from a bed to a chair (including a wheelchair)?: A Little Help needed standing up from a chair using your arms (e.g., wheelchair or bedside chair)?: A Little Help needed to walk in hospital room?: A Little Help needed climbing 3-5 steps with a railing? : A Lot 6 Click Score: 17    End of Session   Activity Tolerance: Patient tolerated treatment well Patient left: in bed;with call bell/phone within reach;with bed alarm set Nurse Communication: Mobility status PT Visit Diagnosis: Unsteadiness on feet (R26.81);Other abnormalities of gait and mobility (R26.89);Muscle weakness (generalized) (M62.81)    Time: 7588-3254 PT Time Calculation (min) (ACUTE ONLY): 16 min   Charges:   PT Evaluation $PT Eval Moderate Complexity: 1 Mod         Lanney Gins, PT, DPT Supplemental Physical Therapist 11/17/18 9:18 AM Pager: [phone] Office: [phone]

## 2018-11-17 NOTE — Progress Notes (Signed)
[facility] Progress Note   Subjective: Seen in room. Lying flat in bed without breathing issues. Endorses cough. Mild fever overnight.   Objective Vitals:   11/16/18 1830 11/16/18 1845 11/16/18 2156 11/17/18 0402  BP: (!) 154/82 140/83 (!) 148/76 116/71  Pulse: (!) 122 (!) 114 (!) 123 (!) 113  Resp: (!) 22 20 19 16   Temp: 98.2 F (36.8 C) 99.5 F (37.5 C) 98.6 F (37 C) 100.1 F (37.8 C)  TempSrc: Oral Oral Oral Oral  SpO2: 98% 99% 100% (!) 75%  Weight:  60 kg    Height:        Physical Exam General: Ill appearing female NAD Heart: Tachy Lungs: Diminished at bases. No wheeze, crackles Abdomen: soft NTND Extremities: LE edema  Dialysis Access: R IJ TDC LUE AVG +bruit   Wt Readings from Last 3 Encounters:  11/16/18 60 kg  10/28/18 59.7 kg  10/13/18 61.6 kg   Weight change:    Additional Objective Labs: Basic Metabolic Panel: Recent Labs  Lab 11/16/18 0349 11/16/18 1440 11/17/18 0423  NA 138 139 135  K 3.9 4.0 3.9  CL 106 103 98  CO2 17* 19* 22  GLUCOSE 173* 112* 117*  BUN 43* 48* 17  CREATININE 9.84* 10.31* 4.75*  CALCIUM 7.3* 7.3* 8.0*  PHOS  --  7.2* 5.1*   CBC: Recent Labs  Lab 11/16/18 0349 11/16/18 1440 11/17/18 0423  WBC 14.6* 12.8* 12.8*  HGB 7.1* 6.4* 9.5*  HCT 23.7* 21.2* 30.1*  MCV 78.2* 77.9* 79.2*  PLT 244 246 237   Blood Culture    Component Value Date/Time   SDES BLOOD RIGHT FOREARM 11/16/2018 1238   SPECREQUEST  11/16/2018 1238    BOTTLES DRAWN AEROBIC ONLY Blood Culture adequate volume   CULT  11/16/2018 1238    NO GROWTH < 24 HOURS Performed at [facility], [place] [address], [place], [place] [address]    REPTSTATUS PENDING 11/16/2018 1238     Medications: . cefTRIAXone (ROCEPHIN)  IV Stopped (11/16/18 1135)   . sodium chloride   Intravenous Once  . azithromycin  500 mg Oral Q24H  . Chlorhexidine Gluconate Cloth  6 each Topical Q0600  . [START ON 11/18/2018] doxercalciferol  4 mcg Intravenous Q  T,Th,Sa-HD  . feeding supplement (PRO-STAT SUGAR FREE 64)  30 mL Oral BID  . heparin  5,000 Units Subcutaneous Q8H  . insulin aspart  0-5 Units Subcutaneous QHS  . insulin aspart  0-9 Units Subcutaneous TID WC  . mupirocin ointment  1 application Nasal BID    Dialysis Orders:   [facility]  on TTS . Time: 4 hours, 180NRe, BFR 400/DFR 800, EDW 59.5kg 2K, 2.5 Ca bath. TDC; LUE AVG (placed 2/18) Heparin: 3000 unit bolus Mircera: 132mcg IV q 2 weeks (last dose 2/25) Venofer 100mg  IV q HD x 5 doses (1 dose given 3/5) Hectorol 4 mcg IV q HD  Assessment/Plan: 1. Dyspnea/Pulmonary edema with possible infiltrate in R lung base  on CXR.  Blood cultures neg to date. Antibiotics per primary. Breathing improved with HD. 3L off yesterday. No acute distress today  2. ESRD - HD TTS. Continue on schedule. Next HD 3/10.   3. HTN/volume- BP ok. No BP meds. Post HD wt 3/8 60kg with 3L off. Continue to challenge EDW as tolerated  4. Anemia-  Hgb 9.5 s/p 2 units prbcs 3/8. Resume ESA with HD 3/10 5. Metabolic Bone Disease- Ca ok/Phos sl elevated. No binders yet.  Follow trend. Continue Hectorol added Ca bath.  6. Nutrition - Renal diet/fluid restrictions. Prot supp for low albumin  7. DM - insulin per primary    Lynnda Child PA-C [place] Pager [phone] 11/17/2018,12:04 PM  LOS: 0 days

## 2018-11-18 ENCOUNTER — Inpatient Hospital Stay (HOSPITAL_COMMUNITY): Payer: Medicare Other

## 2018-11-18 DIAGNOSIS — Z794 Long term (current) use of insulin: Secondary | ICD-10-CM

## 2018-11-18 DIAGNOSIS — E1122 Type 2 diabetes mellitus with diabetic chronic kidney disease: Secondary | ICD-10-CM

## 2018-11-18 DIAGNOSIS — I150 Renovascular hypertension: Secondary | ICD-10-CM

## 2018-11-18 LAB — GLUCOSE, CAPILLARY
Glucose-Capillary: 146 mg/dL — ABNORMAL HIGH (ref 70–99)
Glucose-Capillary: 163 mg/dL — ABNORMAL HIGH (ref 70–99)
Glucose-Capillary: 95 mg/dL (ref 70–99)
Glucose-Capillary: 159 mg/dL — ABNORMAL HIGH (ref 70–99)

## 2018-11-18 LAB — CBC WITH DIFFERENTIAL/PLATELET
Abs Immature Granulocytes: 0.09 10*3/uL — ABNORMAL HIGH (ref 0.00–0.07)
BASOS ABS: 0 10*3/uL (ref 0.0–0.1)
Basophils Relative: 0 %
Eosinophils Absolute: 0.1 10*3/uL (ref 0.0–0.5)
Eosinophils Relative: 1 %
HCT: 31.3 % — ABNORMAL LOW (ref 36.0–46.0)
HEMOGLOBIN: 9.4 g/dL — AB (ref 12.0–15.0)
Immature Granulocytes: 1 %
LYMPHS ABS: 2.1 10*3/uL (ref 0.7–4.0)
LYMPHS PCT: 21 %
MCH: 24.6 pg — ABNORMAL LOW (ref 26.0–34.0)
MCHC: 30 g/dL (ref 30.0–36.0)
MCV: 81.9 fL (ref 80.0–100.0)
Monocytes Absolute: 1.1 10*3/uL — ABNORMAL HIGH (ref 0.1–1.0)
Monocytes Relative: 12 %
Neutro Abs: 6.5 10*3/uL (ref 1.7–7.7)
Neutrophils Relative %: 65 %
Platelets: ADEQUATE 10*3/uL (ref 150–400)
RBC: 3.82 MIL/uL — ABNORMAL LOW (ref 3.87–5.11)
RDW: 21.7 % — ABNORMAL HIGH (ref 11.5–15.5)
WBC: 9.9 10*3/uL (ref 4.0–10.5)
nRBC: 5.4 % — ABNORMAL HIGH (ref 0.0–0.2)

## 2018-11-18 LAB — RENAL FUNCTION PANEL
Albumin: 2.4 g/dL — ABNORMAL LOW (ref 3.5–5.0)
Anion gap: 13 (ref 5–15)
BUN: 42 mg/dL — ABNORMAL HIGH (ref 8–23)
CALCIUM: 8.1 mg/dL — AB (ref 8.9–10.3)
CO2: 25 mmol/L (ref 22–32)
Chloride: 98 mmol/L (ref 98–111)
Creatinine, Ser: 7.32 mg/dL — ABNORMAL HIGH (ref 0.44–1.00)
GFR calc Af Amer: 6 mL/min — ABNORMAL LOW (ref >60)
GFR calc non Af Amer: 5 mL/min — ABNORMAL LOW (ref >60)
Glucose, Bld: 156 mg/dL — ABNORMAL HIGH (ref 70–99)
Phosphorus: 6.7 mg/dL — ABNORMAL HIGH (ref 2.5–4.6)
Potassium: 3.8 mmol/L (ref 3.5–5.1)
Sodium: 136 mmol/L (ref 135–145)

## 2018-11-18 MED ORDER — GUAIFENESIN 100 MG/5ML PO SOLN
5.0000 mL | ORAL | Status: DC | PRN
  Administered 2018-11-18: 100 mg via ORAL
  Filled 2018-11-18: qty 5

## 2018-11-18 MED ORDER — PENTAFLUOROPROP-TETRAFLUOROETH EX AERO: 1.0000 "application " | INHALATION_SPRAY | CUTANEOUS | Status: DC | PRN

## 2018-11-18 MED ORDER — HEPARIN SODIUM (PORCINE) 1000 UNIT/ML DIALYSIS
1000.0000 [IU] | INTRAMUSCULAR | Status: DC | PRN
  Administered 2018-11-18: 1000 [IU] via INTRAVENOUS_CENTRAL
  Filled 2018-11-18 (×2): qty 1

## 2018-11-18 MED ORDER — SODIUM CHLORIDE 0.9 % IV SOLN: 100.0000 mL | INTRAVENOUS | Status: DC | PRN

## 2018-11-18 MED ORDER — TRAMADOL HCL 50 MG PO TABS
50.0000 mg | ORAL_TABLET | Freq: Once | ORAL | Status: AC
  Administered 2018-11-18: 50 mg via ORAL
  Filled 2018-11-18: qty 1

## 2018-11-18 MED ORDER — LIDOCAINE-PRILOCAINE 2.5-2.5 % EX CREA
1.0000 "application " | TOPICAL_CREAM | CUTANEOUS | Status: DC | PRN
  Filled 2018-11-18: qty 5

## 2018-11-18 MED ORDER — ALTEPLASE 2 MG IJ SOLR
2.0000 mg | Freq: Once | INTRAMUSCULAR | Status: DC | PRN
  Filled 2018-11-18: qty 2

## 2018-11-18 MED ORDER — DOXERCALCIFEROL 4 MCG/2ML IV SOLN
INTRAVENOUS | Status: AC
  Administered 2018-11-18: 4 ug via INTRAVENOUS
  Filled 2018-11-18: qty 2

## 2018-11-18 MED ORDER — TRAZODONE HCL 50 MG PO TABS
50.0000 mg | ORAL_TABLET | Freq: Once | ORAL | Status: AC
  Administered 2018-11-18: 50 mg via ORAL
  Filled 2018-11-18: qty 1

## 2018-11-18 MED ORDER — TRAMADOL HCL 50 MG PO TABS
25.0000 mg | ORAL_TABLET | Freq: Four times a day (QID) | ORAL | Status: DC | PRN
  Administered 2018-11-18 – 2018-11-21 (×7): 25 mg via ORAL
  Filled 2018-11-18 (×9): qty 1

## 2018-11-18 MED ORDER — TRAMADOL HCL 50 MG PO TABS
25.0000 mg | ORAL_TABLET | Freq: Four times a day (QID) | ORAL | Status: DC
  Administered 2018-11-18: 25 mg via ORAL
  Filled 2018-11-18: qty 1

## 2018-11-18 MED ORDER — HEPARIN SODIUM (PORCINE) 1000 UNIT/ML DIALYSIS
20.0000 [IU]/kg | INTRAMUSCULAR | Status: DC | PRN
  Filled 2018-11-18: qty 2

## 2018-11-18 MED ORDER — POLYETHYLENE GLYCOL 3350 17 G PO PACK
17.0000 g | PACK | Freq: Every day | ORAL | Status: DC | PRN
  Administered 2018-11-18 – 2018-11-19 (×2): 17 g via ORAL
  Filled 2018-11-18 (×2): qty 1

## 2018-11-18 MED ORDER — LIDOCAINE HCL (PF) 1 % IJ SOLN
5.0000 mL | INTRAMUSCULAR | Status: DC | PRN
  Filled 2018-11-18: qty 5

## 2018-11-18 MED ORDER — BENZONATATE 100 MG PO CAPS
200.0000 mg | ORAL_CAPSULE | Freq: Three times a day (TID) | ORAL | Status: DC | PRN
  Administered 2018-11-19 – 2018-11-20 (×2): 200 mg via ORAL
  Filled 2018-11-18 (×2): qty 2

## 2018-11-18 MED ORDER — DARBEPOETIN ALFA 150 MCG/0.3ML IJ SOSY
PREFILLED_SYRINGE | INTRAMUSCULAR | Status: AC
  Administered 2018-11-18: 150 ug via INTRAVENOUS
  Filled 2018-11-18: qty 0.3

## 2018-11-18 MED ORDER — HEPARIN SODIUM (PORCINE) 1000 UNIT/ML IJ SOLN
INTRAMUSCULAR | Status: AC
  Administered 2018-11-18: 1000 [IU] via INTRAVENOUS_CENTRAL
  Filled 2018-11-18: qty 6

## 2018-11-18 NOTE — Progress Notes (Signed)
PROGRESS NOTE        PATIENT DETAILS Name: Rhonda Lynch Age: 63 y.o. Sex: female Date of Birth: Apr 21, 1956 Admit Date: 11/16/2018 Admitting Physician Barb Merino, MD DZH:[id number], Lesli Albee, PA-C  Brief Narrative: Patient is a 63 y.o. female with history of ESRD on HD, DM-2, HTN presented with several days history of shortness of breath and loose stools, thought to have acute hypoxic respiratory failure secondary to pneumonia and pulmonary edema in the setting of missed HD.  Subjective: Seen earlier today in hemodialysis-felt better.  Denies any chest pain, shortness of breath, nausea, vomiting.  Assessment/Plan: Acute hypoxic respiratory failure secondary to pneumonia and pulmonary edema in the setting of missed hemodialysis: Overall improved-continue with empiric antimicrobial therapy, nephrology following and directing HD.  Respiratory virus panel negative.  Blood cultures negative so far.  Per RN, patient occasionally having some streaks of blood in sputum-repeat chest x-ray today.  Have asked nursing staff to titrate down FiO2 as tolerated.  Epigastric/abdominal pain: Improved-hardly any abdominal pain today.  Lipase levels normal.  Abdominal exam benign-continue PPI   Anemia: Has anemia secondary to CKD at baseline-suspect drop in hemoglobin is likely secondary to acute illness.  Received 2 units of PRBC overnight-hemoglobin stable at 9.4 this morning.  Follow  DM-2: CBGs stable-continue SSI.  ESRD: Nephrology following and directing HD care.  Hypertension: Blood pressure controlled-amlodipine remains on hold  DVT Prophylaxis: Prophylactic Heparin   Code Status: Full code   Family Communication: None at bedside  Disposition Plan: Remain inpatient  Antimicrobial agents: Anti-infectives (From admission, onward)   Start     Dose/Rate Route Frequency Ordered Stop   11/16/18 1100  cefTRIAXone (ROCEPHIN) 1 g in sodium chloride 0.9 % 100 mL  IVPB     1 g 200 mL/hr over 30 Minutes Intravenous Every 24 hours 11/16/18 1024 11/23/18 1059   11/16/18 1100  azithromycin (ZITHROMAX) tablet 500 mg     500 mg Oral Every 24 hours 11/16/18 1024 11/23/18 1059   11/16/18 0515  cefTRIAXone (ROCEPHIN) 1 g in sodium chloride 0.9 % 100 mL IVPB     1 g 200 mL/hr over 30 Minutes Intravenous  Once 11/16/18 0513 11/16/18 0630   11/16/18 0515  azithromycin (ZITHROMAX) 500 mg in sodium chloride 0.9 % 250 mL IVPB     500 mg 250 mL/hr over 60 Minutes Intravenous  Once 11/16/18 0513 11/16/18 0746      Procedures: None  CONSULTS:  nephrology  Time spent: 25- minutes-Greater than 50% of this time was spent in counseling, explanation of diagnosis, planning of further management, and coordination of care.  MEDICATIONS: Scheduled Meds: . sodium chloride   Intravenous Once  . azithromycin  500 mg Oral Q24H  . Chlorhexidine Gluconate Cloth  6 each Topical Q0600  . darbepoetin (ARANESP) injection - DIALYSIS  150 mcg Intravenous Q Tue-HD  . doxercalciferol  4 mcg Intravenous Q T,Th,Sa-HD  . feeding supplement (PRO-STAT SUGAR FREE 64)  30 mL Oral BID  . heparin  5,000 Units Subcutaneous Q8H  . insulin aspart  0-5 Units Subcutaneous QHS  . insulin aspart  0-9 Units Subcutaneous TID WC  . multivitamin  1 tablet Oral QHS  . mupirocin ointment  1 application Nasal BID   Continuous Infusions: . cefTRIAXone (ROCEPHIN)  IV 1 g (11/18/18 1327)   PRN Meds:.albuterol, guaiFENesin, ibuprofen, traMADol  PHYSICAL EXAM: Vital signs: Vitals:   11/18/18 1030 11/18/18 1100 11/18/18 1127 11/18/18 1251  BP: 116/63 117/68 112/65 112/67  Pulse: 88 96 (!) 106 (!) 111  Resp: 15  17 20   Temp:   97.9 F (36.6 C) 98.7 F (37.1 C)  TempSrc:   Oral   SpO2:   94% 98%  Weight:   58.2 kg   Height:       Filed Weights   11/16/18 1845 11/18/18 0720 11/18/18 1127  Weight: 60 kg 61.2 kg 58.2 kg   Body mass index is 24.24 kg/m.   General appearance :Awake,  alert, not in any distress. HEENT: Atraumatic and Normocephalic Neck: supple Resp:Good air entry bilaterally, heard anteriorly CVS: S1 S2 regular GI: Bowel sounds present, Non tender and not distended with no gaurding, rigidity or rebound.No organomegaly Extremities: B/L Lower Ext shows no edema, both legs are warm to touch Neurology: Moving all 4 extremities Musculoskeletal:No digital cyanosis Skin:No Rash, warm and dry Wounds:N/A  I have personally reviewed following labs and imaging studies  LABORATORY DATA: CBC: Recent Labs  Lab 11/16/18 0349 11/16/18 1440 11/17/18 0423 11/18/18 0736  WBC 14.6* 12.8* 12.8* 9.9  NEUTROABS  --   --   --  6.5  HGB 7.1* 6.4* 9.5* 9.4*  HCT 23.7* 21.2* 30.1* 31.3*  MCV 78.2* 77.9* 79.2* 81.9  PLT 244 246 237 PLATELET CLUMPS NOTED ON SMEAR, COUNT APPEARS ADEQUATE    Basic Metabolic Panel: Recent Labs  Lab 11/16/18 0349 11/16/18 1440 11/17/18 0423 11/18/18 0736  NA 138 139 135 136  K 3.9 4.0 3.9 3.8  CL 106 103 98 98  CO2 17* 19* 22 25  GLUCOSE 173* 112* 117* 156*  BUN 43* 48* 17 42*  CREATININE 9.84* 10.31* 4.75* 7.32*  CALCIUM 7.3* 7.3* 8.0* 8.1*  PHOS  --  7.2* 5.1* 6.7*    GFR: Estimated Creatinine Clearance: 6.3 mL/min (A) (by C-G formula based on SCr of 7.32 mg/dL (H)).  Liver Function Tests: Recent Labs  Lab 11/16/18 0349 11/16/18 1440 11/18/18 0736  AST 31  --   --   ALT 16  --   --   ALKPHOS 83  --   --   BILITOT 1.0  --   --   PROT 6.4*  --   --   ALBUMIN 2.6* 2.4* 2.4*   Recent Labs  Lab 11/16/18 0349  LIPASE 38   No results for input(s): AMMONIA in the last 168 hours.  Coagulation Profile: No results for input(s): INR, PROTIME in the last 168 hours.  Cardiac Enzymes: No results for input(s): CKTOTAL, CKMB, CKMBINDEX, TROPONINI in the last 168 hours.  BNP (last 3 results) No results for input(s): PROBNP in the last 8760 hours.  HbA1C: No results for input(s): HGBA1C in the last 72  hours.  CBG: Recent Labs  Lab 11/17/18 1213 11/17/18 1738 11/17/18 2121 11/18/18 0700 11/18/18 1333  GLUCAP 80 192* 193* 146* 163*    Lipid Profile: No results for input(s): CHOL, HDL, LDLCALC, TRIG, CHOLHDL, LDLDIRECT in the last 72 hours.  Thyroid Function Tests: No results for input(s): TSH, T4TOTAL, FREET4, T3FREE, THYROIDAB in the last 72 hours.  Anemia Panel: No results for input(s): VITAMINB12, FOLATE, FERRITIN, TIBC, IRON, RETICCTPCT in the last 72 hours.  Urine analysis:    Component Value Date/Time   COLORURINE AMBER (A) 05/06/2018 1523   APPEARANCEUR HAZY (A) 05/06/2018 1523   LABSPEC 1.017 05/06/2018 1523   PHURINE 6.0 05/06/2018 1523  GLUCOSEU 50 (A) 05/06/2018 1523   HGBUR LARGE (A) 05/06/2018 1523   HGBUR trace-intact 03/08/2010 1452   BILIRUBINUR NEGATIVE 05/06/2018 1523   KETONESUR NEGATIVE 05/06/2018 1523   PROTEINUR >=300 (A) 05/06/2018 1523   UROBILINOGEN 0.2 03/08/2010 1452   NITRITE NEGATIVE 05/06/2018 1523   LEUKOCYTESUR MODERATE (A) 05/06/2018 1523    Sepsis Labs: Lactic Acid, Venous    Component Value Date/Time   LATICACIDVEN 1.30 06/20/2018 2215    MICROBIOLOGY: Recent Results (from the past 240 hour(s))  Surgical PCR screen     Status: None   Collection Time: 11/16/18 11:06 AM  Result Value Ref Range Status   MRSA, PCR NEGATIVE NEGATIVE Final   Staphylococcus aureus NEGATIVE NEGATIVE Final    Comment: (NOTE) The Xpert SA Assay (FDA approved for NASAL specimens in patients 72 years of age and older), is one component of a comprehensive surveillance program. It is not intended to diagnose infection nor to guide or monitor treatment. Performed at [facility], [place] [address] [place], [place] [address]   Culture, blood (routine x 2) Call MD if unable to obtain prior to antibiotics being given     Status: None (Preliminary result)   Collection Time: 11/16/18 12:31 PM  Result Value Ref Range Status   Specimen Description  BLOOD RIGHT ANTECUBITAL  Final   Special Requests   Final    BOTTLES DRAWN AEROBIC ONLY Blood Culture adequate volume   Culture   Final    NO GROWTH 2 DAYS Performed at [facility], [place] [address], [place], [place] [address]    Report Status PENDING  Incomplete  Culture, blood (routine x 2) Call MD if unable to obtain prior to antibiotics being given     Status: None (Preliminary result)   Collection Time: 11/16/18 12:38 PM  Result Value Ref Range Status   Specimen Description BLOOD RIGHT FOREARM  Final   Special Requests   Final    BOTTLES DRAWN AEROBIC ONLY Blood Culture adequate volume   Culture   Final    NO GROWTH 2 DAYS Performed at [facility], [address], [place], [place] [address]    Report Status PENDING  Incomplete  Respiratory Panel by PCR     Status: None   Collection Time: 11/17/18  7:18 AM  Result Value Ref Range Status   Adenovirus NOT DETECTED NOT DETECTED Final   Coronavirus 229E NOT DETECTED NOT DETECTED Final    Comment: (NOTE) The Coronavirus on the Respiratory Panel, DOES NOT test for the novel  Coronavirus (2019 nCoV)    Coronavirus HKU1 NOT DETECTED NOT DETECTED Final   Coronavirus NL63 NOT DETECTED NOT DETECTED Final   Coronavirus OC43 NOT DETECTED NOT DETECTED Final   Metapneumovirus NOT DETECTED NOT DETECTED Final   Rhinovirus / Enterovirus NOT DETECTED NOT DETECTED Final   Influenza A NOT DETECTED NOT DETECTED Final   Influenza B NOT DETECTED NOT DETECTED Final   Parainfluenza Virus 1 NOT DETECTED NOT DETECTED Final   Parainfluenza Virus 2 NOT DETECTED NOT DETECTED Final   Parainfluenza Virus 3 NOT DETECTED NOT DETECTED Final   Parainfluenza Virus 4 NOT DETECTED NOT DETECTED Final   Respiratory Syncytial Virus NOT DETECTED NOT DETECTED Final   Bordetella pertussis NOT DETECTED NOT DETECTED Final   Chlamydophila pneumoniae NOT DETECTED NOT DETECTED Final   Mycoplasma pneumoniae NOT DETECTED NOT DETECTED Final    Comment:  Performed at [facility], [address] [place],  [place] [address]    RADIOLOGY STUDIES/RESULTS: Dg Chest 2 View  Result Date: 10/24/2018 CLINICAL DATA:  Shortness of breath. Right lower lobe consolidation. EXAM: CHEST - 2 VIEW COMPARISON:  Radiograph October 23, 2018. FINDINGS: Stable cardiomegaly. No pneumothorax or pleural effusion is noted. Stable position of right internal jugular dialysis catheter. Left lung is clear. Stable opacity seen laterally in right lung base consistent with scarring, infiltrate or atelectasis. The visualized skeletal structures are unremarkable. IMPRESSION: Stable right basilar opacity as noted on prior exam. Potentially this may represent pneumonia, atelectasis or scarring. Followup PA and lateral chest X-ray is recommended in 3-4 weeks following trial of antibiotic therapy to ensure resolution and exclude underlying malignancy. Electronically Signed   By: Marijo Conception, M.D.   On: 10/24/2018 11:36   Ct Abdomen Pelvis W Contrast  Result Date: 10/23/2018 CLINICAL DATA:  Abdominal cramping and distension with 1 episode of vomiting. EXAM: CT ABDOMEN AND PELVIS WITH CONTRAST TECHNIQUE: Multidetector CT imaging of the abdomen and pelvis was performed using the standard protocol following bolus administration of intravenous contrast. CONTRAST:  169mL OMNIPAQUE IOHEXOL 300 MG/ML  SOLN COMPARISON:  06/21/2018 FINDINGS: Lower chest: Scarring versus atelectasis in the bilateral lower lobes, right greater than left. Hepatobiliary: No focal liver abnormality is seen. Status post cholecystectomy. No biliary dilatation. Pancreas: Unremarkable. No pancreatic ductal dilatation or surrounding inflammatory changes. Spleen: Hypoattenuated mass within the spleen measures 14 mm, stable. There is also a stable 8 mm cyst in the inferior tip of the spleen. Adrenals/Urinary Tract: Adrenal glands are unremarkable. Kidneys are without renal calculi, focal lesion, or hydronephrosis. No  contrast is seen within the collecting system on the delayed images. Bladder is unremarkable. Stomach/Bowel: Indistinctness of the walls of the duodenum without pathologic distension. Questionable circumferential thickening of the gastric antrum. No evidence of small-bowel obstruction. The colon is decompressed. Normal appendix. Vascular/Lymphatic: Mildly enlarged lymph nodes within the porta hepatis. Stable mildly prominent lymph nodes within the pericardial fat in right cardiophrenic angle. Reproductive: Status post hysterectomy. Fat containing left ovarian mass is stable at 15 mm. Other: Moderate volume abdominopelvic ascites. Water density nodules in the right abdominal wall may represent injection sites. Musculoskeletal: Posterior facet arthropathy in the lower lumbosacral spine. IMPRESSION: Interval development of moderate in volume abdominopelvic ascites. Thickening of the walls of the gastric antrum; indistinct appearance of the duodenum, findings which may be seen with enteritis. Lack of contrast excretion within the renal collecting systems, consistent with renal failure. Stable fat containing mass on the left ovary. Electronically Signed   By: Fidela Salisbury M.D.   On: 10/23/2018 10:43   Dg Chest Port 1 View  Result Date: 11/16/2018 CLINICAL DATA:  Nausea and vomiting with cough EXAM: PORTABLE CHEST 1 VIEW COMPARISON:  10/24/2018 FINDINGS: Cardiac shadow is enlarged. Aortic calcifications are again seen. Dialysis catheter is again noted on the right. Diffuse alveolar opacities are identified consistent with acute pulmonary edema no focal effusion is seen. Slight increased density is noted in the right base which may represent a superimposed infiltrate. IMPRESSION: Changes consistent with pulmonary edema bilaterally with likely superimposed infiltrate in the right base. Electronically Signed   By: Inez Catalina M.D.   On: 11/16/2018 05:01   Dg Chest Portable 1 View  Result Date:  10/23/2018 CLINICAL DATA:  Patient with cough and shortness of breath. EXAM: PORTABLE CHEST 1 VIEW COMPARISON:  Chest radiograph 10/05/2018 FINDINGS: Dual lumen central venous catheter tip projects over the superior vena cava. Monitoring leads overlie the patient.  Stable cardiomegaly. Aortic atherosclerosis. Interval development patchy consolidative opacities right lower lung. Slight interval improvement bilateral interstitial pulmonary opacities. No pleural effusion or pneumothorax. IMPRESSION: Interval development peripheral consolidative opacities right lower lung concerning for infection in the appropriate clinical setting. Followup PA and lateral chest X-ray is recommended in 3-4 weeks following trial of antibiotic therapy to ensure resolution and exclude underlying malignancy. Electronically Signed   By: Lovey Newcomer M.D.   On: 10/23/2018 09:07     LOS: 1 day   Oren Binet, MD  [facility]  If 7PM-7AM, please contact night-coverage  Please page via [email or web address]  Go to [email or web address] and use [facility]'s universal password to access. If you do not have the password, please contact the hospital operator.  Locate the [facility] provider you are looking for under [facility] and page to a number that you can be directly reached. If you still have difficulty reaching the provider, please page the [facility] (Director on Call) for the Hospitalists listed on amion for assistance.  11/18/2018, 1:36 PM

## 2018-11-18 NOTE — Procedures (Signed)
Patient seen and examined on Hemodialysis. BP 115/69 (BP Location: Right Arm)   Pulse 86   Temp (!) 97.4 F (36.3 C) (Oral)   Resp 14   Ht 5\' 1"  (1.549 m)   Wt 60 kg   SpO2 97%   BMI 24.99 kg/m   QB 400 mL/ min via R IJ TDC, UF goal 3.5L today  Tolerating treatment without complaints at this time.  Will adjust EDW as appropriate.  Labs are pending this AM.     Madelon Lips MD [place] pgr [phone] 8:35 AM

## 2018-11-18 NOTE — Progress Notes (Signed)
[facility] Progress Note   Subjective:  Seen on HD.  Feeling better but sleepy.  No cough/ SOB/ CP.    Objective Vitals:   11/18/18 0629 11/18/18 0720 11/18/18 0726 11/18/18 0730  BP: 95/71 111/70 138/78 115/69  Pulse: (!) 107 (!) 101 94 86  Resp: 17 18 12 14   Temp: 98.6 F (37 C) (!) 97.4 F (36.3 C)    TempSrc: Oral Oral    SpO2: 96% 97%    Weight:      Height:        Physical Exam General: sleeping, easily arousable Heart: RRR no m/r/g Lungs: Diminished at bases. No wheeze, crackles Abdomen: soft NTND,  Extremities: no LE edema  Dialysis Access: R IJ TDC LUE AVG +bruit   Wt Readings from Last 3 Encounters:  11/16/18 60 kg  10/28/18 59.7 kg  10/13/18 61.6 kg   Weight change:    Additional Objective Labs: Basic Metabolic Panel: Recent Labs  Lab 11/16/18 1440 11/17/18 0423 11/18/18 0736  NA 139 135 136  K 4.0 3.9 3.8  CL 103 98 98  CO2 19* 22 25  GLUCOSE 112* 117* 156*  BUN 48* 17 42*  CREATININE 10.31* 4.75* 7.32*  CALCIUM 7.3* 8.0* 8.1*  PHOS 7.2* 5.1* 6.7*   CBC: Recent Labs  Lab 11/16/18 0349 11/16/18 1440 11/17/18 0423  WBC 14.6* 12.8* 12.8*  HGB 7.1* 6.4* 9.5*  HCT 23.7* 21.2* 30.1*  MCV 78.2* 77.9* 79.2*  PLT 244 246 237   Blood Culture    Component Value Date/Time   SDES BLOOD RIGHT FOREARM 11/16/2018 1238   SPECREQUEST  11/16/2018 1238    BOTTLES DRAWN AEROBIC ONLY Blood Culture adequate volume   CULT  11/16/2018 1238    NO GROWTH 2 DAYS Performed at [facility], [place] [address], [place], [place] [address]    REPTSTATUS PENDING 11/16/2018 1238     Medications: . [START ON 11/19/2018] sodium chloride    . [START ON 11/19/2018] sodium chloride    . cefTRIAXone (ROCEPHIN)  IV Stopped (11/17/18 1332)   . sodium chloride   Intravenous Once  . azithromycin  500 mg Oral Q24H  . Chlorhexidine Gluconate Cloth  6 each Topical Q0600  . Darbepoetin Alfa      . darbepoetin (ARANESP) injection - DIALYSIS  150 mcg  Intravenous Q Tue-HD  . doxercalciferol      . doxercalciferol  4 mcg Intravenous Q T,Th,Sa-HD  . feeding supplement (PRO-STAT SUGAR FREE 64)  30 mL Oral BID  . heparin      . heparin  5,000 Units Subcutaneous Q8H  . insulin aspart  0-5 Units Subcutaneous QHS  . insulin aspart  0-9 Units Subcutaneous TID WC  . multivitamin  1 tablet Oral QHS  . mupirocin ointment  1 application Nasal BID    Dialysis Orders:   [facility]  on TTS . Time: 4 hours, 180NRe, BFR 400/DFR 800, EDW 59.5kg 2K, 2.5 Ca bath. TDC; LUE AVG (placed 2/18) Heparin: 3000 unit bolus Mircera: 115mcg IV q 2 weeks (last dose 2/25) Venofer 100mg  IV q HD x 5 doses (1 dose given 3/5) Hectorol 4 mcg IV q HD  Assessment/Plan: 1. Dyspnea/Pulmonary edema with possible infiltrate in R lung base  on CXR.  Blood cultures neg to date x 2 days. Antibiotics per primary (azithro/ CTX). Breathing improved with HD, for HD today. 2. ESRD - HD TTS. Continue on schedule. Next HD today 3/10.   3.  HTN/volume- BP ok. No BP meds. Post HD wt 3/8 60kg with 3L off. Continue to challenge EDW as tolerated- will follow up post-weight from today and adjust as needed  4. Anemia-  Hgb 9.5 s/p 2 units prbcs 3/8. Resume ESA with HD 3/10 5. Metabolic Bone Disease- Ca ok/Phos sl elevated. No binders yet. Follow trend. Continue Hectorol added Ca bath.  6. Nutrition - Renal diet/fluid restrictions. Prot supp for low albumin  7. DM - insulin per primary  8. Dispo: Possible DC today.   Madelon Lips MD [facility] Pager [phone] 11/18/2018,8:31 AM  LOS: 1 day

## 2018-11-18 NOTE — Progress Notes (Signed)
Patient not in room, currently at dialysis.

## 2018-11-19 ENCOUNTER — Other Ambulatory Visit: Payer: Self-pay

## 2018-11-19 ENCOUNTER — Inpatient Hospital Stay (HOSPITAL_COMMUNITY): Payer: Medicare Other

## 2018-11-19 DIAGNOSIS — J189 Pneumonia, unspecified organism: Principal | ICD-10-CM

## 2018-11-19 LAB — GLUCOSE, CAPILLARY
Glucose-Capillary: 153 mg/dL — ABNORMAL HIGH (ref 70–99)
Glucose-Capillary: 188 mg/dL — ABNORMAL HIGH (ref 70–99)
Glucose-Capillary: 135 mg/dL — ABNORMAL HIGH (ref 70–99)
Glucose-Capillary: 140 mg/dL — ABNORMAL HIGH (ref 70–99)

## 2018-11-19 LAB — PROCALCITONIN: Procalcitonin: 2.42 ng/mL

## 2018-11-19 MED ORDER — PENTAFLUOROPROP-TETRAFLUOROETH EX AERO: 1.0000 "application " | INHALATION_SPRAY | CUTANEOUS | Status: DC | PRN

## 2018-11-19 MED ORDER — LIDOCAINE HCL (PF) 1 % IJ SOLN
5.0000 mL | INTRAMUSCULAR | Status: DC | PRN
  Filled 2018-11-19: qty 5

## 2018-11-19 MED ORDER — TRAZODONE HCL 50 MG PO TABS
50.0000 mg | ORAL_TABLET | Freq: Every evening | ORAL | Status: DC | PRN
  Administered 2018-11-19 – 2018-11-20 (×3): 50 mg via ORAL
  Filled 2018-11-19 (×3): qty 1

## 2018-11-19 MED ORDER — IOHEXOL 300 MG/ML  SOLN
75.0000 mL | Freq: Once | INTRAMUSCULAR | Status: AC | PRN
  Administered 2018-11-19: 75 mL via INTRAVENOUS

## 2018-11-19 MED ORDER — LIDOCAINE-PRILOCAINE 2.5-2.5 % EX CREA
1.0000 "application " | TOPICAL_CREAM | CUTANEOUS | Status: DC | PRN
  Filled 2018-11-19: qty 5

## 2018-11-19 MED ORDER — SODIUM CHLORIDE 0.9 % IV SOLN: 100.0000 mL | INTRAVENOUS | Status: DC | PRN

## 2018-11-19 MED ORDER — HEPARIN SODIUM (PORCINE) 1000 UNIT/ML DIALYSIS
3000.0000 [IU] | Freq: Once | INTRAMUSCULAR | Status: AC
  Administered 2018-11-20: 3400 [IU] via INTRAVENOUS_CENTRAL
  Filled 2018-11-19: qty 3

## 2018-11-19 MED ORDER — HEPARIN SODIUM (PORCINE) 1000 UNIT/ML DIALYSIS
1000.0000 [IU] | INTRAMUSCULAR | Status: DC | PRN
  Filled 2018-11-19: qty 1

## 2018-11-19 MED ORDER — DIPHENHYDRAMINE HCL 25 MG PO CAPS
25.0000 mg | ORAL_CAPSULE | Freq: Three times a day (TID) | ORAL | Status: DC | PRN
  Administered 2018-11-19 – 2018-11-21 (×4): 25 mg via ORAL
  Filled 2018-11-19 (×4): qty 1

## 2018-11-19 MED ORDER — CHLORHEXIDINE GLUCONATE CLOTH 2 % EX PADS
6.0000 | MEDICATED_PAD | Freq: Every day | CUTANEOUS | Status: DC
  Administered 2018-11-20 – 2018-11-21 (×2): 6 via TOPICAL

## 2018-11-19 NOTE — Progress Notes (Signed)
PROGRESS NOTE        PATIENT DETAILS Name: Rhonda Lynch Age: 63 y.o. Sex: female Date of Birth: 1955/10/28 Admit Date: 11/16/2018 Admitting Physician Barb Merino, MD MOQ:HUTML, Lesli Albee, PA-C  Brief Narrative: Patient is a 63 y.o. female with past medical history significant for ESRD on HD, hypertension, type 2 diabetes, history of CVA, hepatitis C, and polysubstance abuse who presented to the ED on 3/8 with chief complaint of abdominal pain and loose, watery stools x 3 days. Associated symptoms include progressive dyspnea, vomiting, and cough productive of clear mucoid sputum. Patient reports she missed dialysis on 3/3 and did not complete the entire session on 3/7 due to cramping. In the ED, patient hypoxic with O2 in 80s, improved to 95% with 2L of oxygen. Chest x-ray with bilateral pulmonary edema and right lower lobe infiltrate suggestive of pneumonia. She was started on IV antibiotics and admitted for urgent dialysis.   Subjective: Patient feeling better, epigastric pain still present but has improved. Complains of blood in sputum. She denies chest pain, shortness of breath, nausea, and vomiting.   Assessment/Plan: Principal Problem:   PNEUMONIA Active Problems:   TOBACCO ABUSE   ESRD (end stage renal disease) (HCC)   Microcytic anemia   Pneumonia   Hypertension   Diabetes ([place])   Non-compliance with renal dialysis ([place])   End-stage renal disease on hemodialysis ([facility])   CAP (community acquired pneumonia)  1. Acute hypoxic respiratory failure secondary to pneumonia, pulmonary edema  -Repeat chest x-ray with improving pulmonary edema. Breathing improved with HD. Right residual opacity suggestive of chronic scarring. Given patient's intermittent hemoptysis and smoking/substance use history, will obtain CT chest for further evaluation. Plan to continue po azithromycin and iv ceftriaxone   2. ESRD -Per patient records, patient noncompliant with  medications and HD. Unable to complete treatments lately due to cramping during dialysis. Patient received inpatient HD 3/8, 3/9, 3/10. Cr at 7.32. K at 3.8. Nephrology following   3. Epigastric abdominal pain -Improving with HD, minimal tenderness on exam. Lipase negative. CT abdomen performed 2/13 with abdominal ascites and possible enteritis. Will continue to monitor    4. Anemia of chronic disease -Chronic, persistent anemia- drop in hgb most likely secondary to acute illness. Patient received 2 units RBC transfusion 3/8. Hemoglobin improved from 6.4 to 9.5, currently stable with no further drops. Will continue to follow   5. Type 2 diabetes  -CBG 153 this am. Will continue with sliding scale insulin and routine CBG monitoring   6. Hypertension -Normotensive, SBP 122. Monitor    New Imaging studies (independently reviewed): improving pulmonary edema  Echo (reviewed):EF 50-55% on TTE done on 04/03/18  Old records review: prior d/c summary  Morning labs/Imaging ordered: yes  DVT Prophylaxis: Prophylactic Heparin   Code Status: Full code  Family Communication: None  Disposition Plan: Remain inpatient pending clinical improvement   Antimicrobial agents: Anti-infectives (From admission, onward)   Start     Dose/Rate Route Frequency Ordered Stop   11/16/18 1100  cefTRIAXone (ROCEPHIN) 1 g in sodium chloride 0.9 % 100 mL IVPB     1 g 200 mL/hr over 30 Minutes Intravenous Every 24 hours 11/16/18 1024 11/23/18 1059   11/16/18 1100  azithromycin (ZITHROMAX) tablet 500 mg     500 mg Oral Every 24 hours 11/16/18 1024 11/23/18 1059   11/16/18 0515  cefTRIAXone (ROCEPHIN) 1 g in sodium chloride 0.9 % 100 mL IVPB     1 g 200 mL/hr over 30 Minutes Intravenous  Once 11/16/18 0513 11/16/18 0630   11/16/18 0515  azithromycin (ZITHROMAX) 500 mg in sodium chloride 0.9 % 250 mL IVPB     500 mg 250 mL/hr over 60 Minutes Intravenous  Once 11/16/18 0513 11/16/18 0746       Procedures: Inpatient hemodialysis   CONSULTS: Nephrology  MEDICATIONS: Scheduled Meds:  sodium chloride   Intravenous Once   azithromycin  500 mg Oral Q24H   Chlorhexidine Gluconate Cloth  6 each Topical Q0600   darbepoetin (ARANESP) injection - DIALYSIS  150 mcg Intravenous Q Tue-HD   doxercalciferol  4 mcg Intravenous Q T,Th,Sa-HD   feeding supplement (PRO-STAT SUGAR FREE 64)  30 mL Oral BID   heparin  5,000 Units Subcutaneous Q8H   insulin aspart  0-5 Units Subcutaneous QHS   insulin aspart  0-9 Units Subcutaneous TID WC   multivitamin  1 tablet Oral QHS   mupirocin ointment  1 application Nasal BID   Continuous Infusions:  cefTRIAXone (ROCEPHIN)  IV 1 g (11/19/18 1013)   PRN Meds:.albuterol, benzonatate, guaiFENesin, ibuprofen, polyethylene glycol, traMADol, traZODone   PHYSICAL EXAM: Vital signs: Vitals:   11/18/18 1127 11/18/18 1251 11/18/18 2157 11/19/18 0530  BP: 112/65 112/67 109/71 122/83  Pulse: (!) 106 (!) 111 (!) 101 (!) 101  Resp: 17 20 18 20   Temp: 97.9 F (36.6 C) 98.7 F (37.1 C) 98.3 F (36.8 C) 98.3 F (36.8 C)  TempSrc: Oral  Oral Oral  SpO2: 94% 98% 95% 99%  Weight: 58.2 kg     Height:       Filed Weights   11/16/18 1845 11/18/18 0720 11/18/18 1127  Weight: 60 kg 61.2 kg 58.2 kg   Body mass index is 24.24 kg/m.   General: Awake, alert, not in any distress. Appears deconditioned  HEENT: Oral mucosa moist, no scleral icterus  CV: Regular rate and rhythm  Resp: Good air entry bilaterally GI: Minimal epigastric tenderness to palpation. Not distended with no guarding Extremities: No LE edema bilaterally  Neurology: Speech clear, moves all four extremities  Musculoskeletal: No visible joint deformities  Skin: No rash  LABORATORY DATA: CBC: Recent Labs  Lab 11/16/18 0349 11/16/18 1440 11/17/18 0423 11/18/18 0736  WBC 14.6* 12.8* 12.8* 9.9  NEUTROABS  --   --   --  6.5  HGB 7.1* 6.4* 9.5* 9.4*  HCT 23.7* 21.2*  30.1* 31.3*  MCV 78.2* 77.9* 79.2* 81.9  PLT 244 246 237 PLATELET CLUMPS NOTED ON SMEAR, COUNT APPEARS ADEQUATE    Basic Metabolic Panel: Recent Labs  Lab 11/16/18 0349 11/16/18 1440 11/17/18 0423 11/18/18 0736  NA 138 139 135 136  K 3.9 4.0 3.9 3.8  CL 106 103 98 98  CO2 17* 19* 22 25  GLUCOSE 173* 112* 117* 156*  BUN 43* 48* 17 42*  CREATININE 9.84* 10.31* 4.75* 7.32*  CALCIUM 7.3* 7.3* 8.0* 8.1*  PHOS  --  7.2* 5.1* 6.7*    GFR: Estimated Creatinine Clearance: 6.3 mL/min (A) (by C-G formula based on SCr of 7.32 mg/dL (H)).  Liver Function Tests: Recent Labs  Lab 11/16/18 0349 11/16/18 1440 11/18/18 0736  AST 31  --   --   ALT 16  --   --   ALKPHOS 83  --   --   BILITOT 1.0  --   --   PROT 6.4*  --   --  ALBUMIN 2.6* 2.4* 2.4*   Recent Labs  Lab 11/16/18 0349  LIPASE 38   No results for input(s): AMMONIA in the last 168 hours.  Coagulation Profile: No results for input(s): INR, PROTIME in the last 168 hours.  Cardiac Enzymes: No results for input(s): CKTOTAL, CKMB, CKMBINDEX, TROPONINI in the last 168 hours.  BNP (last 3 results) No results for input(s): PROBNP in the last 8760 hours.  HbA1C: No results for input(s): HGBA1C in the last 72 hours.  CBG: Recent Labs  Lab 11/18/18 0700 11/18/18 1333 11/18/18 1703 11/18/18 2153 11/19/18 0732  GLUCAP 146* 163* 95 159* 153*    Lipid Profile: No results for input(s): CHOL, HDL, LDLCALC, TRIG, CHOLHDL, LDLDIRECT in the last 72 hours.  Thyroid Function Tests: No results for input(s): TSH, T4TOTAL, FREET4, T3FREE, THYROIDAB in the last 72 hours.  Anemia Panel: No results for input(s): VITAMINB12, FOLATE, FERRITIN, TIBC, IRON, RETICCTPCT in the last 72 hours.  Urine analysis:    Component Value Date/Time   COLORURINE AMBER (A) 05/06/2018 1523   APPEARANCEUR HAZY (A) 05/06/2018 1523   LABSPEC 1.017 05/06/2018 1523   PHURINE 6.0 05/06/2018 1523   GLUCOSEU 50 (A) 05/06/2018 1523   HGBUR LARGE  (A) 05/06/2018 1523   HGBUR trace-intact 03/08/2010 1452   BILIRUBINUR NEGATIVE 05/06/2018 1523   KETONESUR NEGATIVE 05/06/2018 1523   PROTEINUR >=300 (A) 05/06/2018 1523   UROBILINOGEN 0.2 03/08/2010 1452   NITRITE NEGATIVE 05/06/2018 1523   LEUKOCYTESUR MODERATE (A) 05/06/2018 1523    Sepsis Labs: Lactic Acid, Venous    Component Value Date/Time   LATICACIDVEN 1.30 06/20/2018 2215    MICROBIOLOGY: Recent Results (from the past 240 hour(s))  Surgical PCR screen     Status: None   Collection Time: 11/16/18 11:06 AM  Result Value Ref Range Status   MRSA, PCR NEGATIVE NEGATIVE Final   Staphylococcus aureus NEGATIVE NEGATIVE Final    Comment: (NOTE) The Xpert SA Assay (FDA approved for NASAL specimens in patients 82 years of age and older), is one component of a comprehensive surveillance program. It is not intended to diagnose infection nor to guide or monitor treatment. Performed at [facility], [place] [address] [place], [place] [address]   Culture, blood (routine x 2) Call MD if unable to obtain prior to antibiotics being given     Status: None (Preliminary result)   Collection Time: 11/16/18 12:31 PM  Result Value Ref Range Status   Specimen Description BLOOD RIGHT ANTECUBITAL  Final   Special Requests   Final    BOTTLES DRAWN AEROBIC ONLY Blood Culture adequate volume   Culture   Final    NO GROWTH 3 DAYS Performed at [facility], [place] [address], [place], [place] [address]    Report Status PENDING  Incomplete  Culture, blood (routine x 2) Call MD if unable to obtain prior to antibiotics being given     Status: None (Preliminary result)   Collection Time: 11/16/18 12:38 PM  Result Value Ref Range Status   Specimen Description BLOOD RIGHT FOREARM  Final   Special Requests   Final    BOTTLES DRAWN AEROBIC ONLY Blood Culture adequate volume   Culture   Final    NO GROWTH 3 DAYS Performed at [facility], [address] [place], [place] [address]     Report Status PENDING  Incomplete  Respiratory Panel by PCR     Status: None   Collection Time: 11/17/18  7:18 AM  Result Value Ref Range Status   Adenovirus NOT DETECTED NOT DETECTED Final   Coronavirus 229E NOT DETECTED NOT DETECTED Final    Comment: (NOTE) The Coronavirus on the Respiratory Panel, DOES NOT test for the novel  Coronavirus (2019 nCoV)    Coronavirus HKU1 NOT DETECTED NOT DETECTED Final   Coronavirus NL63 NOT DETECTED NOT DETECTED Final   Coronavirus OC43 NOT DETECTED NOT DETECTED Final   Metapneumovirus NOT DETECTED NOT DETECTED Final   Rhinovirus / Enterovirus NOT DETECTED NOT DETECTED Final   Influenza A NOT DETECTED NOT DETECTED Final   Influenza B NOT DETECTED NOT DETECTED Final   Parainfluenza Virus 1 NOT DETECTED NOT DETECTED Final   Parainfluenza Virus 2 NOT DETECTED NOT DETECTED Final   Parainfluenza Virus 3 NOT DETECTED NOT DETECTED Final   Parainfluenza Virus 4 NOT DETECTED NOT DETECTED Final   Respiratory Syncytial Virus NOT DETECTED NOT DETECTED Final   Bordetella pertussis NOT DETECTED NOT DETECTED Final   Chlamydophila pneumoniae NOT DETECTED NOT DETECTED Final   Mycoplasma pneumoniae NOT DETECTED NOT DETECTED Final    Comment: Performed at [facility], [place] [address], [place], [place] [address]    RADIOLOGY STUDIES/RESULTS: Dg Chest 2 View  Result Date: 10/24/2018 CLINICAL DATA:  Shortness of breath. Right lower lobe consolidation. EXAM: CHEST - 2 VIEW COMPARISON:  Radiograph October 23, 2018. FINDINGS: Stable cardiomegaly. No pneumothorax or pleural effusion is noted. Stable position of right internal jugular dialysis catheter. Left lung is clear. Stable opacity seen laterally in right lung base consistent with scarring, infiltrate or atelectasis. The visualized skeletal structures are unremarkable. IMPRESSION: Stable right basilar opacity as noted on prior exam. Potentially this may represent pneumonia, atelectasis or scarring. Followup PA  and lateral chest X-ray is recommended in 3-4 weeks following trial of antibiotic therapy to ensure resolution and exclude underlying malignancy. Electronically Signed   By: Marijo Conception, M.D.   On: 10/24/2018 11:36   Ct Abdomen Pelvis W Contrast  Result Date: 10/23/2018 CLINICAL DATA:  Abdominal cramping and distension with 1 episode of vomiting. EXAM: CT ABDOMEN AND PELVIS WITH CONTRAST TECHNIQUE: Multidetector CT imaging of the abdomen and pelvis was performed using the standard protocol following bolus administration of intravenous contrast. CONTRAST:  152mL OMNIPAQUE IOHEXOL 300 MG/ML  SOLN COMPARISON:  06/21/2018 FINDINGS: Lower chest: Scarring versus atelectasis in the bilateral lower lobes, right greater than left. Hepatobiliary: No focal liver abnormality is seen. Status post cholecystectomy. No biliary dilatation. Pancreas: Unremarkable. No pancreatic ductal dilatation or surrounding inflammatory changes. Spleen: Hypoattenuated mass within the spleen measures 14 mm, stable. There is also a stable 8 mm cyst in the inferior tip of the spleen. Adrenals/Urinary Tract: Adrenal glands are unremarkable. Kidneys are without renal calculi, focal lesion, or hydronephrosis. No contrast is seen within the collecting system on the delayed images. Bladder is unremarkable. Stomach/Bowel: Indistinctness of the walls of the duodenum without pathologic distension. Questionable circumferential thickening of the gastric antrum. No evidence of small-bowel obstruction. The colon is decompressed. Normal appendix. Vascular/Lymphatic: Mildly enlarged lymph nodes within the porta hepatis. Stable mildly prominent lymph nodes within the pericardial fat in right cardiophrenic angle. Reproductive: Status post hysterectomy. Fat containing left ovarian mass is stable at 15 mm. Other: Moderate volume abdominopelvic ascites. Water density nodules in the right abdominal wall may represent injection sites. Musculoskeletal: Posterior  facet arthropathy in the lower lumbosacral spine. IMPRESSION: Interval development of moderate in volume abdominopelvic ascites. Thickening of the walls of the gastric antrum; indistinct  appearance of the duodenum, findings which may be seen with enteritis. Lack of contrast excretion within the renal collecting systems, consistent with renal failure. Stable fat containing mass on the left ovary. Electronically Signed   By: Fidela Salisbury M.D.   On: 10/23/2018 10:43   Dg Chest Port 1 View  Result Date: 11/16/2018 CLINICAL DATA:  Nausea and vomiting with cough EXAM: PORTABLE CHEST 1 VIEW COMPARISON:  10/24/2018 FINDINGS: Cardiac shadow is enlarged. Aortic calcifications are again seen. Dialysis catheter is again noted on the right. Diffuse alveolar opacities are identified consistent with acute pulmonary edema no focal effusion is seen. Slight increased density is noted in the right base which may represent a superimposed infiltrate. IMPRESSION: Changes consistent with pulmonary edema bilaterally with likely superimposed infiltrate in the right base. Electronically Signed   By: Inez Catalina M.D.   On: 11/16/2018 05:01   Dg Chest Portable 1 View  Result Date: 10/23/2018 CLINICAL DATA:  Patient with cough and shortness of breath. EXAM: PORTABLE CHEST 1 VIEW COMPARISON:  Chest radiograph 10/05/2018 FINDINGS: Dual lumen central venous catheter tip projects over the superior vena cava. Monitoring leads overlie the patient. Stable cardiomegaly. Aortic atherosclerosis. Interval development patchy consolidative opacities right lower lung. Slight interval improvement bilateral interstitial pulmonary opacities. No pleural effusion or pneumothorax. IMPRESSION: Interval development peripheral consolidative opacities right lower lung concerning for infection in the appropriate clinical setting. Followup PA and lateral chest X-ray is recommended in 3-4 weeks following trial of antibiotic therapy to ensure resolution and  exclude underlying malignancy. Electronically Signed   By: Lovey Newcomer M.D.   On: 10/23/2018 09:07   Dg Chest Port 1v Same Day  Result Date: 11/18/2018 CLINICAL DATA:  Shortness of breath. EXAM: PORTABLE CHEST 1 VIEW COMPARISON:  Radiographs 11/2018 and 10/24/2018. Abdominal CT 10/23/2018. FINDINGS: 1403 hours. The right IJ hemodialysis catheter is unchanged, tip at the superior cavoatrial junction. There is stable cardiomegaly and aortic atherosclerosis. The bilateral airspace opacities have improved, most consistent with resolving edema. Residual asymmetric opacity at the right costophrenic angle appears chronic, likely scarring. No significant pleural effusion. IMPRESSION: Improving pulmonary edema. Electronically Signed   By: Richardean Sale M.D.   On: 11/18/2018 14:35     LOS: 2 days   Deboraha Sprang, PA-S  11/19/2018, 10:31 AM

## 2018-11-19 NOTE — Progress Notes (Signed)
Physical Therapy Treatment Patient Details Name: Rhonda Lynch MRN: [id number] DOB: 1956/04/26 Today's Date: 11/19/2018    History of Present Illness Patient is a 63 y/o female presenting to the ED on 11/16/2018 with primary complaints of N&V and abdominal pain. 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 Admitted for PNA work-up.    PT Comments    Continuing work on functional mobility and activity tolerance;  Noting excellent improvement in gait stediness and activity tolerance; Will likely not need HHPT   Follow Up Recommendations  Supervision - Intermittent(Likely does not need HHPT)     Equipment Recommendations  None recommended by PT    Recommendations for Other Services       Precautions / Restrictions Precautions Precautions: Fall    Mobility  Bed Mobility Overal bed mobility: Modified Independent                Transfers Overall transfer level: Modified independent Equipment used: None Transfers: Sit to/from Stand Sit to Stand: Modified independent (Device/Increase time)         General transfer comment: Much improved  Ambulation/Gait Ambulation/Gait assistance: Supervision Gait Distance (Feet): 200 Feet Assistive device: None Gait Pattern/deviations: Step-through pattern     General Gait Details: Much improved, with occasional small losses of balance that she recovered from independently; cues to self-monitor for activity tolerance; slightly winded at teh end fo walk   Stairs             Wheelchair Mobility    Modified Rankin (Stroke Patients Only)       Balance Overall balance assessment: Mild deficits observed, not formally tested                                          Cognition  Arousal/Alertness: Awake/alert Behavior During Therapy: WFL for tasks assessed/performed Overall Cognitive Status: Within Functional Limits for tasks assessed                                        Exercises      General Comments        Pertinent Vitals/Pain Pain Assessment: Faces Faces Pain Scale: Hurts a little bit Pain Location: abdomen with mobility Pain Descriptors / Indicators: Aching Pain Intervention(s): Monitored during session    Home Living                      Prior Function            PT Goals (current goals can now be found in the care plan section) Acute Rehab PT Goals Patient Stated Goal: reduce pain PT Goal Formulation: With patient Time For Goal Achievement: 12/01/18 Potential to Achieve Goals: Good Progress towards PT goals: Progressing toward goals(likely one more session to meet goal)    Frequency    Min 3X/week      PT Plan Discharge plan needs to be updated    Co-evaluation              AM-PAC PT "6 Clicks" Mobility   Outcome Measure  Help needed turning from your back to your side while in  a flat bed without using bedrails?: None Help needed moving from lying on your back to sitting on the side of a flat bed without using bedrails?: None Help needed moving to and from a bed to a chair (including a wheelchair)?: None Help needed standing up from a chair using your arms (e.g., wheelchair or bedside chair)?: None Help needed to walk in hospital room?: None Help needed climbing 3-5 steps with a railing? : A Little 6 Click Score: 23    End of Session Equipment Utilized During Treatment: Gait belt Activity Tolerance: Patient tolerated treatment well Patient left: in bed;with call bell/phone within reach;with bed alarm set Nurse Communication: Mobility status PT Visit Diagnosis: Unsteadiness on feet (R26.81);Other abnormalities of gait and mobility (R26.89);Muscle weakness (generalized) (M62.81)      Time: 7622-6333 PT Time Calculation (min) (ACUTE ONLY): 12 min  Charges:  $Gait Training: 8-22 mins                     Roney Marion, [place] Pager [phone] Office [place] 11/19/2018, 4:15 PM

## 2018-11-19 NOTE — Consult Note (Signed)
NAME:  Rhonda Lynch, MRN:  [id number], DOB:  April 16, 1956, LOS: 2 ADMISSION DATE:  11/16/2018, CONSULTATION DATE: 11/19/2018 REFERRING MD:  Nena Alexander MD, CHIEF COMPLAINT: Hemoptysis, bilateral infiltrates.  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  Initially felt to be volume overload vs pneumonia. She was treated with antibiotics (Ceftriaxone, azithromycin] and underwent dialysis with no improvement in symptoms.  Developed hemoptysis on 3/10.  Chest CT 3/11 shows bilateral GGO pulmonary infiltrates and pulmonary consulted for help with management.  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  Received Plex, pulse dose steroids with prednisone taper and p.o. Cytoxan at that time.  She is no longer on immunosuppression.  Significant Hospital Events     Consults:  Nephrology, PCCM  Procedures:    Significant Diagnostic Tests:  Chest x-ray 11/16/2018-diffuse bilateral infiltrates Chest x-ray 11/18/2018- improving bilateral infiltrates CT chest 11/19/2018- extensive groundglass opacities in the lung bilaterally. I have reviewed the images personally  Micro Data:  Blood culture 11/16/2018- no growth Respiratory virus panel 11/17/2018- negative  Antimicrobials:  Ceftriaxone 3/8 > Azithromycin 3/8 >  Interim history/subjective:    Objective   Blood pressure 128/80, pulse (!) 102, temperature 98.5 F (36.9 C), temperature source Oral, resp. rate 20, height 5\' 1"  (1.549 m), weight 58.2 kg,  SpO2 99 %.       No intake or output data in the 24 hours ending 11/19/18 1519 Filed Weights   11/16/18 1845 11/18/18 0720 11/18/18 1127  Weight: 60 kg 61.2 kg 58.2 kg    Examination: Blood pressure 123/85, pulse (!) 105, temperature 99.6 F (37.6 C), temperature source Oral, resp. rate 18, height 5\' 1"  (1.549 m), weight 58.2 kg, SpO2 96 %. Gen:      No acute distress HEENT:  EOMI, sclera anicteric Neck:     No masses; no thyromegaly Lungs:    Clear to auscultation bilaterally; normal respiratory effort CV:         Regular rate and rhythm; no murmurs Abd:      + bowel sounds; soft, non-tender; no palpable masses, no distension Ext:    No edema; adequate peripheral perfusion Skin:      Warm and dry; no rash Neuro: alert and oriented x 3 Psych: normal mood and affect  Resolved Hospital Problem list     Assessment & Plan:  63 year old with biopsy-proven ANCA vasculitis, end-stage renal disease, BOOP, cocaine user Admitted with hypoxic respiratory failure with bilateral pulmonary infiltrates and minor hemoptysis  Cause of hemoptysis is unclear.  Could be from any number of etiologies including pneumonia, volume overload, cocaine use [she had been using cocaine up to the time of admission] or recurrence of vasculitis. Overall she is getting better with improving trajectory of bilateral infiltrates on chest x-ray from admission.  The CT scan from today does show groundglass opacities but it is hard to compare this directly with the recent chest x-rays.   Hold off on steroids right now Continue antibiotics, check pro calcitonin Repeat chest x-ray tomorrow.  If it continues to improve then no other intervention needed  If she worsens then we can consider pulse steroids and Cytoxan.  Labs   CBC: Recent Labs  Lab 11/16/18 0349 11/16/18 1440 11/17/18 0423 11/18/18 0736  WBC 14.6* 12.8* 12.8* 9.9  NEUTROABS  --   --   --  6.5  HGB 7.1* 6.4* 9.5* 9.4*  HCT 23.7* 21.2* 30.1* 31.3*   MCV 78.2* 77.9* 79.2* 81.9  PLT 244 246 237 PLATELET CLUMPS NOTED ON SMEAR, COUNT APPEARS ADEQUATE    Basic Metabolic Panel: Recent Labs  Lab 11/16/18 0349 11/16/18 1440 11/17/18 0423 11/18/18 0736  NA 138 139 135 136  K 3.9 4.0 3.9 3.8  CL 106 103 98 98  CO2 17* 19* 22 25  GLUCOSE 173* 112* 117* 156*  BUN 43* 48* 17 42*  CREATININE 9.84* 10.31* 4.75* 7.32*  CALCIUM 7.3* 7.3* 8.0* 8.1*  PHOS  --  7.2* 5.1* 6.7*   GFR: Estimated Creatinine Clearance: 6.3 mL/min (A) (by C-G formula based on SCr of 7.32 mg/dL (H)). Recent Labs  Lab 11/16/18 0349 11/16/18 1440 11/17/18 0423 11/18/18 0736  WBC 14.6* 12.8* 12.8* 9.9    Liver Function Tests: Recent Labs  Lab 11/16/18 0349 11/16/18 1440 11/18/18 0736  AST 31  --   --   ALT 16  --   --   ALKPHOS 83  --   --   BILITOT 1.0  --   --   PROT 6.4*  --   --   ALBUMIN 2.6* 2.4* 2.4*   Recent Labs  Lab 11/16/18 0349  LIPASE 38   No results for input(s): AMMONIA in the last 168 hours.  ABG    Component Value Date/Time   PHART 7.364 06/21/2018 0125   PCO2ART 41.7 06/21/2018 0125   PO2ART 130 (H) 06/21/2018 0125   HCO3 23.4 06/21/2018 0125   TCO2 24 06/20/2018 2136   ACIDBASEDEF 1.4 06/21/2018 0125   O2SAT 98.5 06/21/2018 0125     Coagulation Profile: No results for input(s): INR, PROTIME in the last 168 hours.  Cardiac Enzymes: No results for input(s): CKTOTAL, CKMB, CKMBINDEX, TROPONINI in the last 168 hours.  HbA1C: Hgb A1c MFr Bld  Date/Time Value Ref Range Status  07/19/2018 05:34 AM 6.1 (H) 4.8 - 5.6 % Final    Comment:    (NOTE) Pre diabetes:          5.7%-6.4% Diabetes:              >6.4% Glycemic control for   <7.0% adults with diabetes   04/02/2018 02:45 PM 6.8 (H) 4.8 - 5.6 % Final    Comment:    (NOTE) Pre diabetes:          5.7%-6.4% Diabetes:              >6.4% Glycemic control for   <7.0% adults with diabetes     CBG: Recent Labs  Lab 11/18/18 1333 11/18/18 1703 11/18/18 2153  11/19/18 0732 11/19/18 1118  GLUCAP 163* 95 159* 153* 188*    Review of Systems:    All negative; except for those that are bolded, which indicate positives.  Constitutional: weight loss, weight gain, night sweats, fevers, chills, fatigue, weakness.  HEENT: headaches, sore throat, sneezing, nasal congestion, post nasal drip, difficulty swallowing, tooth/dental problems, visual complaints, visual changes, ear aches. Neuro: difficulty with speech, weakness, numbness, ataxia. CV:  chest pain, orthopnea, PND, swelling in lower extremities, dizziness, palpitations, syncope.  Resp: cough, hemoptysis, dyspnea, wheezing. GI: heartburn, indigestion, abdominal pain, nausea, vomiting, diarrhea, constipation,  change in bowel habits, loss of appetite, hematemesis, melena, hematochezia.  GU: dysuria, change in color of urine, urgency or frequency, flank pain, hematuria. MSK: joint pain or swelling, decreased range of motion. Psych: change in mood or affect, depression, anxiety, suicidal ideations, homicidal ideations. Skin: rash, itching, bruising.  Past Medical History  She,  has a past medical history of Diabetes mellitus without complication ([place]), ESRD (end stage renal disease) on dialysis ([place]), Hepatitis C, Hypertension, Oxygen deficiency, Stroke ([place]), and Substance abuse ([place]).   Surgical History    Past Surgical History:  Procedure Laterality Date  . AV FISTULA PLACEMENT Left 03/05/2018   Procedure: ARTERIOVENOUS (AV) FISTULA CREATION  LEFT UPPER EXTREMITY;  Surgeon: [name], MD;  Location: [facility];  Service: Vascular;  Laterality: Left;  . AV FISTULA PLACEMENT Left 07/21/2018   Procedure: ARTERIOVENOUS (AV) FISTULA CREATION;  Surgeon: Marty Heck, MD;  Location: [facility];  Service: Vascular;  Laterality: Left;  . AV FISTULA PLACEMENT Left 10/27/2018   Procedure: INSERTION OF ARTERIOVENOUS (AV) GORE-TEX GRAFT UPPER ARM;  Surgeon: Marty Heck, MD;  Location: [facility];   Service: Vascular;  Laterality: Left;  . BASCILIC VEIN TRANSPOSITION Left 09/29/2018   Procedure: BASILIC VEIN TRANSPOSITION SECOND STAGE LEFT UPPER ARM;  Surgeon: Marty Heck, MD;  Location: [facility];  Service: Vascular;  Laterality: Left;  . HEMATOMA EVACUATION Left 03/06/2018   Procedure: EVACUATION HEMATOMA LEFT ARM;  Surgeon: Angelia Mould, MD;  Location: [facility];  Service: Vascular;  Laterality: Left;  . I&D EXTREMITY Left 10/06/2018   Procedure: IRRIGATION AND DEBRIDEMENT ARM;  Surgeon: Marty Heck, MD;  Location: [facility];  Service: Vascular;  Laterality: Left;  . INSERTION OF DIALYSIS CATHETER Left 04/29/2018   Procedure: INSERTION OF DIALYSIS CATHETER LEFT INTERNAL JUGULAR;  Surgeon: Angelia Mould, MD;  Location: [facility];  Service: Vascular;  Laterality: Left;  . IR FLUORO GUIDE CV LINE RIGHT  02/18/2018  . IR FLUORO GUIDE CV LINE RIGHT  02/26/2018  . IR REMOVAL TUN CV CATH W/O FL  04/27/2018  . IR US GUIDE VASC ACCESS RIGHT  02/18/2018  . TEE WITHOUT CARDIOVERSION N/A 04/29/2018   Procedure: TRANSESOPHAGEAL ECHOCARDIOGRAM (TEE);  Surgeon: Sueanne Margarita, MD;  Location[facility] ENDOSCOPY;  Service: Cardiovascular;  Laterality: N/A;  . VENOGRAM Left 10/27/2018   Procedure: LEFT CENTRAL VENOGRAM;  Surgeon: Marty Heck, MD;  Location: [facility];  Service: Vascular;  Laterality: Left;     Social History   reports that she has been smoking cigarettes. She has a 24.00 pack-year smoking history. She has never used smokeless tobacco. She reports previous alcohol use. She reports current drug use. Drugs: Cocaine and Heroin.   Family History   Her family history is negative for Autoimmune disease.   Allergies Allergies  Allergen Reactions  . Acetaminophen Nausea And Vomiting    Patient states Acetaminophen and Acetaminophen containing products make her nauseated. She demonstrated this 06/21/18 with nausea followed by emesis.  Marland Kitchen Hydroxyzine Itching     Home  Medications  Prior to Admission medications   Medication Sig Start Date End Date Taking? Authorizing Provider  amLODipine (NORVASC) 5 MG tablet Take 1 tablet (5 mg total) by mouth at bedtime. 10/13/18  Yes Alphonzo Grieve, MD  B Complex-C-Zn-Folic Acid (DIALYVITE 580-DXIP 15) 0.8 MG TABS Take 1 tablet by mouth daily. 11/04/18  Yes [provider]  calcitRIOL (ROCALTROL) 0.25 MCG capsule Take 1 capsule (0.25 mcg total) by mouth every Monday,  Wednesday, and Friday with hemodialysis. 10/29/18  Yes Isabelle Course, MD  insulin aspart (NOVOLOG) 100 UNIT/ML injection Inject 5 Units into the skin 3 (three) times daily with meals. Patient taking differently: Inject 3 Units into the skin 3 (three) times daily with meals.  10/13/18  Yes Alphonzo Grieve, MD  insulin glargine (LANTUS) 100 UNIT/ML injection Inject 0.03 mLs (3 Units total) into the skin at bedtime. Patient taking differently: Inject 5 Units into the skin at bedtime.  10/29/18  Yes Isabelle Course, MD  multivitamin (RENA-VIT) TABS tablet Take 1 tablet by mouth at bedtime. 10/13/18  Yes Alphonzo Grieve, MD  Blood Glucose Monitoring Suppl (ACCU-CHEK AVIVA) device Use as instructed 10/29/18 10/29/19  Isabelle Course, MD  glucose blood (ACCU-CHEK AVIVA) test strip Use twice per day. 10/29/18   Isabelle Course, MD  Insulin Syringe-Needle U-100 (INSULIN SYRINGE .3CC/31GX5/16") 31G X 5/16" 0.3 ML MISC Inject 1 each into the skin 4 (four) times daily. 06/27/18   Clent Demark, PA-C  Lancets (ACCU-CHEK SOFT [facility]) lancets Use twice per day. 10/29/18   Isabelle Course, MD    Marshell Garfinkel MD [facility] 11/19/2018, 4:09 PM

## 2018-11-19 NOTE — Progress Notes (Signed)
[facility] Progress Note   Subjective:   Seen in room. Feeling better. Off supp oxygen. Denies SOB/CP.   Objective Vitals:   11/18/18 1127 11/18/18 1251 11/18/18 2157 11/19/18 0530  BP: 112/65 112/67 109/71 122/83  Pulse: (!) 106 (!) 111 (!) 101 (!) 101  Resp: 17 20 18 20   Temp: 97.9 F (36.6 C) 98.7 F (37.1 C) 98.3 F (36.8 C) 98.3 F (36.8 C)  TempSrc: Oral  Oral Oral  SpO2: 94% 98% 95% 99%  Weight: 58.2 kg     Height:        Physical Exam General: alert sitting up in bed NAD  Heart: RRR no m/r/g Lungs: Diminished air movement. No wheeze, crackles Abdomen: soft NTND,  Extremities: no LE edema  Dialysis Access: R IJ TDC LUE AVG +bruit   Wt Readings from Last 3 Encounters:  11/18/18 58.2 kg  10/28/18 59.7 kg  10/13/18 61.6 kg   Weight change:    Additional Objective Labs: Basic Metabolic Panel: Recent Labs  Lab 11/16/18 1440 11/17/18 0423 11/18/18 0736  NA 139 135 136  K 4.0 3.9 3.8  CL 103 98 98  CO2 19* 22 25  GLUCOSE 112* 117* 156*  BUN 48* 17 42*  CREATININE 10.31* 4.75* 7.32*  CALCIUM 7.3* 8.0* 8.1*  PHOS 7.2* 5.1* 6.7*   CBC: Recent Labs  Lab 11/16/18 0349 11/16/18 1440 11/17/18 0423 11/18/18 0736  WBC 14.6* 12.8* 12.8* 9.9  NEUTROABS  --   --   --  6.5  HGB 7.1* 6.4* 9.5* 9.4*  HCT 23.7* 21.2* 30.1* 31.3*  MCV 78.2* 77.9* 79.2* 81.9  PLT 244 246 237 PLATELET CLUMPS NOTED ON SMEAR, COUNT APPEARS ADEQUATE   Blood Culture    Component Value Date/Time   SDES BLOOD RIGHT FOREARM 11/16/2018 1238   SPECREQUEST  11/16/2018 1238    BOTTLES DRAWN AEROBIC ONLY Blood Culture adequate volume   CULT  11/16/2018 1238    NO GROWTH 3 DAYS Performed at [facility], [place] [address], [place],  [address]    REPTSTATUS PENDING 11/16/2018 1238     Medications: . cefTRIAXone (ROCEPHIN)  IV 1 g (11/19/18 1013)   . sodium chloride   Intravenous Once  . azithromycin  500 mg Oral Q24H  . Chlorhexidine Gluconate Cloth   6 each Topical Q0600  . darbepoetin (ARANESP) injection - DIALYSIS  150 mcg Intravenous Q Tue-HD  . doxercalciferol  4 mcg Intravenous Q T,Th,Sa-HD  . feeding supplement (PRO-STAT SUGAR FREE 64)  30 mL Oral BID  . heparin  5,000 Units Subcutaneous Q8H  . insulin aspart  0-5 Units Subcutaneous QHS  . insulin aspart  0-9 Units Subcutaneous TID WC  . multivitamin  1 tablet Oral QHS  . mupirocin ointment  1 application Nasal BID    Dialysis Orders:   [facility]  on TTS . Time: 4 hours, 180NRe, BFR 400/DFR 800, EDW 59.5kg 2K, 2.5 Ca bath. TDC; LUE AVG (placed 2/18) Heparin: 3000 unit bolus Mircera: 133mcg IV q 2 weeks (last dose 2/25) Venofer 100mg  IV q HD x 5 doses (1 dose given 3/5) Hectorol 4 mcg IV q HD  Assessment/Plan: 1. Dyspnea/Pulmonary edema with possible infiltrate in R lung base  on CXR.  Blood cultures neg to date x 2 days. Antibiotics per primary (azithro/ CTX). Breathing improved with HD. Pulm edema resolving on CXR. CT chest pending.  2. ESRD - HD TTS. Continue on schedule. Offered extra treatment today for  additional volume removal. Patient refused. Next HD 3/12.  3. HTN/volume- BP ok. No BP meds. 6L off so far.  Now below EDW. Will lower at discharge.  4. Anemia-  Hgb 9.5 s/p 2 units prbcs 3/8. Aranesp 150 mcg dosed 9/21.  5. Metabolic Bone Disease- Ca ok/Phos sl elevated. No binders yet. Follow trend. Continue Hectorol added Ca bath.  6. Nutrition - Renal diet/fluid restrictions. Prot supp for low albumin  7. DM - insulin per primary  8. Dispo: Possible DC today.  Lynnda Child PA-C [facility] Pager [phone] 11/19/2018,11:06 AM  LOS: 2 days

## 2018-11-20 ENCOUNTER — Inpatient Hospital Stay (HOSPITAL_COMMUNITY): Payer: Medicare Other

## 2018-11-20 LAB — RENAL FUNCTION PANEL
Albumin: 2.2 g/dL — ABNORMAL LOW (ref 3.5–5.0)
Anion gap: 13 (ref 5–15)
BUN: 37 mg/dL — ABNORMAL HIGH (ref 8–23)
CO2: 21 mmol/L — ABNORMAL LOW (ref 22–32)
Calcium: 8.5 mg/dL — ABNORMAL LOW (ref 8.9–10.3)
Chloride: 98 mmol/L (ref 98–111)
Creatinine, Ser: 7.69 mg/dL — ABNORMAL HIGH (ref 0.44–1.00)
GFR calc Af Amer: 6 mL/min — ABNORMAL LOW (ref >60)
GFR calc non Af Amer: 5 mL/min — ABNORMAL LOW (ref >60)
Glucose, Bld: 86 mg/dL (ref 70–99)
Phosphorus: 5 mg/dL — ABNORMAL HIGH (ref 2.5–4.6)
Potassium: 3.6 mmol/L (ref 3.5–5.1)
Sodium: 132 mmol/L — ABNORMAL LOW (ref 135–145)

## 2018-11-20 LAB — CBC
HCT: 34.9 % — ABNORMAL LOW (ref 36.0–46.0)
Hemoglobin: 10.7 g/dL — ABNORMAL LOW (ref 12.0–15.0)
MCH: 26.1 pg (ref 26.0–34.0)
MCHC: 30.7 g/dL (ref 30.0–36.0)
MCV: 85.1 fL (ref 80.0–100.0)
Platelets: 236 10*3/uL (ref 150–400)
RBC: 4.1 MIL/uL (ref 3.87–5.11)
RDW: 23.1 % — ABNORMAL HIGH (ref 11.5–15.5)
WBC: 10.9 10*3/uL — ABNORMAL HIGH (ref 4.0–10.5)
nRBC: 6.5 % — ABNORMAL HIGH (ref 0.0–0.2)

## 2018-11-20 LAB — SEDIMENTATION RATE: Sed Rate: 2 mm/h (ref 0–22)

## 2018-11-20 LAB — GLUCOSE, CAPILLARY
Glucose-Capillary: 116 mg/dL — ABNORMAL HIGH (ref 70–99)
Glucose-Capillary: 170 mg/dL — ABNORMAL HIGH (ref 70–99)
Glucose-Capillary: 132 mg/dL — ABNORMAL HIGH (ref 70–99)

## 2018-11-20 LAB — PATHOLOGIST SMEAR REVIEW

## 2018-11-20 LAB — PROCALCITONIN: PROCALCITONIN: 2.1 ng/mL

## 2018-11-20 MED ORDER — DOXERCALCIFEROL 4 MCG/2ML IV SOLN
INTRAVENOUS | Status: AC
  Administered 2018-11-20: 4 ug via INTRAVENOUS
  Filled 2018-11-20: qty 2

## 2018-11-20 MED ORDER — DIPHENHYDRAMINE HCL 25 MG PO CAPS
ORAL_CAPSULE | ORAL | Status: AC
  Administered 2018-11-20: 25 mg via ORAL
  Filled 2018-11-20: qty 1

## 2018-11-20 MED ORDER — ALTEPLASE 2 MG IJ SOLR: 2.0000 mg | Freq: Once | INTRAMUSCULAR | Status: DC | PRN

## 2018-11-20 MED ORDER — HEPARIN SODIUM (PORCINE) 1000 UNIT/ML IJ SOLN
INTRAMUSCULAR | Status: AC
  Administered 2018-11-20: 3400 [IU] via INTRAVENOUS_CENTRAL
  Filled 2018-11-20: qty 4

## 2018-11-20 NOTE — Progress Notes (Signed)
PROGRESS NOTE        PATIENT DETAILS Name: Rhonda Lynch Age: 63 y.o. 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  Shortness of breath improved following HD x2, however now has developed hemoptysis.  Subjective: Feels better-hemoptysis this morning.  Assessment/Plan: Acute hypoxic respiratory failure secondary to pneumonia and pulmonary edema in the setting of missed hemodialysis: Overall improved-with empiric antimicrobial therapy and hemodialysis.  Respiratory virus panel negative, blood cultures negative.  Has completed a 5-day course of antibiotics-stop Rocephin.  Hemoptysis: Thankfully has improved-given prior history of BOOP and ANCA related vasculitis-pulmonology consulted-we will go ahead and repeat chest x-ray to see if lung infiltrates have improved after HD earlier today.  Epigastric/abdominal pain:Hardly any pain today-continue supportive care.  Anemia: Has anemia secondary to CKD at baseline-suspect drop in hemoglobin is likely secondary to acute illness. Received 2 units of PRBC during this hospital stay-hemoglobin is 10.7.  Follow periodically.   DM-2:CBG stable-continue SSI  Hypertension:Blood pressure controlled-amlodipine remains on hold  DVT Prophylaxis: Prophylactic Heparin   Code Status: Full code   Family Communication: None at bedside  Disposition Plan: Remain inpatient  Antimicrobial agents: Anti-infectives (From admission, onward)   Start     Dose/Rate Route Frequency Ordered Stop   11/16/18 1100   cefTRIAXone (ROCEPHIN) 1 g in sodium chloride 0.9 % 100 mL IVPB     1 g 200 mL/hr over 30 Minutes Intravenous Every 24 hours 11/16/18 1024 11/23/18 1059   11/16/18 1100  azithromycin (ZITHROMAX) tablet 500 mg     500 mg Oral Every 24 hours 11/16/18 1024 11/23/18 1059   11/16/18 0515  cefTRIAXone (ROCEPHIN) 1 g in sodium chloride 0.9 % 100 mL IVPB     1 g 200 mL/hr over 30 Minutes Intravenous  Once 11/16/18 0513 11/16/18 0630   11/16/18 0515  azithromycin (ZITHROMAX) 500 mg in sodium chloride 0.9 % 250 mL IVPB     500 mg 250 mL/hr over 60 Minutes Intravenous  Once 11/16/18 0513 11/16/18 0746      Procedures: None  CONSULTS:  nephrology  Time spent: 25- minutes-Greater than 50% of this time was spent in counseling, explanation of diagnosis, planning of further management, and coordination of care.  MEDICATIONS: Scheduled Meds: . sodium chloride   Intravenous Once  . azithromycin  500 mg Oral Q24H  . Chlorhexidine Gluconate Cloth  6 each Topical Q0600  . darbepoetin (ARANESP) injection - DIALYSIS  150 mcg Intravenous Q Tue-HD  . doxercalciferol  4 mcg Intravenous Q T,Th,Sa-HD  . feeding supplement (PRO-STAT SUGAR FREE 64)  30 mL Oral BID  . heparin  5,000 Units Subcutaneous Q8H  . insulin aspart  0-5 Units Subcutaneous QHS  . insulin aspart  0-9 Units Subcutaneous TID WC  . multivitamin  1 tablet Oral QHS  . mupirocin ointment  1 application Nasal BID   Continuous Infusions: . sodium chloride    . sodium chloride    . cefTRIAXone (ROCEPHIN)  IV 1 g (11/20/18 1232)  PRN Meds:.sodium chloride, sodium chloride, albuterol, benzonatate, diphenhydrAMINE, guaiFENesin, heparin, ibuprofen, lidocaine (PF), lidocaine-prilocaine, pentafluoroprop-tetrafluoroeth, polyethylene glycol, traMADol, traZODone   PHYSICAL EXAM: Vital signs: Vitals:   11/20/18 0900 11/20/18 0930 11/20/18 1000 11/20/18 1030  BP: 116/77 103/71 95/67 119/74  Pulse: 99 93 96 95  Resp:    18  Temp:    97.9 F  (36.6 C)  TempSrc:    Oral  SpO2:    96%  Weight:    56.4 kg  Height:       Filed Weights   11/20/18 0510 11/20/18 0650 11/20/18 1030  Weight: 60.4 kg 58.9 kg 56.4 kg   Body mass index is 23.49 kg/m.   General appearance:Awake, alert, not in any distress.  Eyes:no scleral icterus. HEENT: Atraumatic and Normocephalic Neck: supple, no JVD. Resp:Good air entry bilaterally,no added sounds heard anteriorly CVS: S1 S2 regular GI: Bowel sounds present, Non tender and not distended with no gaurding, rigidity or rebound. Extremities: B/L Lower Ext shows no edema, both legs are warm to touch Neurology:  Non focal Psychiatric: Normal judgment and insight. Normal mood. Musculoskeletal:No digital cyanosis Skin:No Rash, warm and dry Wounds:N/A  I have personally reviewed following labs and imaging studies  LABORATORY DATA: CBC: Recent Labs  Lab 11/16/18 0349 11/16/18 1440 11/17/18 0423 11/18/18 0736 11/20/18 0716  WBC 14.6* 12.8* 12.8* 9.9 10.9*  NEUTROABS  --   --   --  6.5  --   HGB 7.1* 6.4* 9.5* 9.4* 10.7*  HCT 23.7* 21.2* 30.1* 31.3* 34.9*  MCV 78.2* 77.9* 79.2* 81.9 85.1  PLT 244 246 237 PLATELET CLUMPS NOTED ON SMEAR, COUNT APPEARS ADEQUATE 161    Basic Metabolic Panel: Recent Labs  Lab 11/16/18 0349 11/16/18 1440 11/17/18 0423 11/18/18 0736 11/20/18 0911  NA 138 139 135 136 132*  K 3.9 4.0 3.9 3.8 3.6  CL 106 103 98 98 98  CO2 17* 19* 22 25 21*  GLUCOSE 173* 112* 117* 156* 86  BUN 43* 48* 17 42* 37*  CREATININE 9.84* 10.31* 4.75* 7.32* 7.69*  CALCIUM 7.3* 7.3* 8.0* 8.1* 8.5*  PHOS  --  7.2* 5.1* 6.7* 5.0*    GFR: Estimated Creatinine Clearance: 5.5 mL/min (A) (by C-G formula based on SCr of 7.69 mg/dL (H)).  Liver Function Tests: Recent Labs  Lab 11/16/18 0349 11/16/18 1440 11/18/18 0736 11/20/18 0911  AST 31  --   --   --   ALT 16  --   --   --   ALKPHOS 83  --   --   --   BILITOT 1.0  --   --   --   PROT 6.4*  --   --   --   ALBUMIN 2.6*  2.4* 2.4* 2.2*   Recent Labs  Lab 11/16/18 0349  LIPASE 38   No results for input(s): AMMONIA in the last 168 hours.  Coagulation Profile: No results for input(s): INR, PROTIME in the last 168 hours.  Cardiac Enzymes: No results for input(s): CKTOTAL, CKMB, CKMBINDEX, TROPONINI in the last 168 hours.  BNP (last 3 results) No results for input(s): PROBNP in the last 8760 hours.  HbA1C: No results for input(s): HGBA1C in the last 72 hours.  CBG: Recent Labs  Lab 11/19/18 0732 11/19/18 1118 11/19/18 1652 11/19/18 2107 11/20/18 1153  GLUCAP 153* 188* 135* 140* 116*    Lipid Profile: No results for input(s): CHOL, HDL, LDLCALC, TRIG, CHOLHDL, LDLDIRECT in the last 72 hours.  Thyroid Function Tests: No results  for input(s): TSH, T4TOTAL, FREET4, T3FREE, THYROIDAB in the last 72 hours.  Anemia Panel: No results for input(s): VITAMINB12, FOLATE, FERRITIN, TIBC, IRON, RETICCTPCT in the last 72 hours.  Urine analysis:    Component Value Date/Time   COLORURINE AMBER (A) 05/06/2018 1523   APPEARANCEUR HAZY (A) 05/06/2018 1523   LABSPEC 1.017 05/06/2018 1523   PHURINE 6.0 05/06/2018 1523   GLUCOSEU 50 (A) 05/06/2018 1523   HGBUR LARGE (A) 05/06/2018 1523   HGBUR trace-intact 03/08/2010 1452   BILIRUBINUR NEGATIVE 05/06/2018 1523   KETONESUR NEGATIVE 05/06/2018 1523   PROTEINUR >=300 (A) 05/06/2018 1523   UROBILINOGEN 0.2 03/08/2010 1452   NITRITE NEGATIVE 05/06/2018 1523   LEUKOCYTESUR MODERATE (A) 05/06/2018 1523    Sepsis Labs: Lactic Acid, Venous    Component Value Date/Time   LATICACIDVEN 1.30 06/20/2018 2215    MICROBIOLOGY: Recent Results (from the past 240 hour(s))  Surgical PCR screen     Status: None   Collection Time: 11/16/18 11:06 AM  Result Value Ref Range Status   MRSA, PCR NEGATIVE NEGATIVE Final   Staphylococcus aureus NEGATIVE NEGATIVE Final    Comment: (NOTE) The Xpert SA Assay (FDA approved for NASAL specimens in patients 55 years of  age and older), is one component of a comprehensive surveillance program. It is not intended to diagnose infection nor to guide or monitor treatment. Performed at [facility], [place] [address], [place], [place] [address]   Culture, blood (routine x 2) Call MD if unable to obtain prior to antibiotics being given     Status: None (Preliminary result)   Collection Time: 11/16/18 12:31 PM  Result Value Ref Range Status   Specimen Description BLOOD RIGHT ANTECUBITAL  Final   Special Requests   Final    BOTTLES DRAWN AEROBIC ONLY Blood Culture adequate volume   Culture   Final    NO GROWTH 4 DAYS Performed at [facility], [place] [address], [place], [place] [address]    Report Status PENDING  Incomplete  Culture, blood (routine x 2) Call MD if unable to obtain prior to antibiotics being given     Status: None (Preliminary result)   Collection Time: 11/16/18 12:38 PM  Result Value Ref Range Status   Specimen Description BLOOD RIGHT FOREARM  Final   Special Requests   Final    BOTTLES DRAWN AEROBIC ONLY Blood Culture adequate volume   Culture   Final    NO GROWTH 4 DAYS Performed at [facility], [place] [address], [place], [place] [address]    Report Status PENDING  Incomplete  Respiratory Panel by PCR     Status: None   Collection Time: 11/17/18  7:18 AM  Result Value Ref Range Status   Adenovirus NOT DETECTED NOT DETECTED Final   Coronavirus 229E NOT DETECTED NOT DETECTED Final    Comment: (NOTE) The Coronavirus on the Respiratory Panel, DOES NOT test for the novel  Coronavirus (2019 nCoV)    Coronavirus HKU1 NOT DETECTED NOT DETECTED Final   Coronavirus NL63 NOT DETECTED NOT DETECTED Final   Coronavirus OC43 NOT DETECTED NOT DETECTED Final   Metapneumovirus NOT DETECTED NOT DETECTED Final   Rhinovirus / Enterovirus NOT DETECTED NOT DETECTED Final   Influenza A NOT DETECTED NOT DETECTED Final   Influenza B NOT DETECTED NOT DETECTED Final   Parainfluenza Virus 1 NOT  DETECTED NOT DETECTED Final   Parainfluenza Virus 2 NOT DETECTED NOT DETECTED Final   Parainfluenza Virus 3  NOT DETECTED NOT DETECTED Final   Parainfluenza Virus 4 NOT DETECTED NOT DETECTED Final   Respiratory Syncytial Virus NOT DETECTED NOT DETECTED Final   Bordetella pertussis NOT DETECTED NOT DETECTED Final   Chlamydophila pneumoniae NOT DETECTED NOT DETECTED Final   Mycoplasma pneumoniae NOT DETECTED NOT DETECTED Final    Comment: Performed at [facility], [place] [address], [place],  [address]    RADIOLOGY STUDIES/RESULTS: Dg Chest 2 View  Result Date: 10/24/2018 CLINICAL DATA:  Shortness of breath. Right lower lobe consolidation. EXAM: CHEST - 2 VIEW COMPARISON:  Radiograph October 23, 2018. FINDINGS: Stable cardiomegaly. No pneumothorax or pleural effusion is noted. Stable position of right internal jugular dialysis catheter. Left lung is clear. Stable opacity seen laterally in right lung base consistent with scarring, infiltrate or atelectasis. The visualized skeletal structures are unremarkable. IMPRESSION: Stable right basilar opacity as noted on prior exam. Potentially this may represent pneumonia, atelectasis or scarring. Followup PA and lateral chest X-ray is recommended in 3-4 weeks following trial of antibiotic therapy to ensure resolution and exclude underlying malignancy. Electronically Signed   By: Marijo Conception, M.D.   On: 10/24/2018 11:36   Ct Chest W Contrast  Result Date: 11/19/2018 CLINICAL DATA:  Hemoptysis x 2-3 days EXAM: CT CHEST WITH CONTRAST TECHNIQUE: Multidetector CT imaging of the chest was performed during intravenous contrast administration. CONTRAST:  15mL OMNIPAQUE IOHEXOL 300 MG/ML  SOLN COMPARISON:  Chest radiographs dated 11/18/2018 FINDINGS: Cardiovascular: Heart is normal in size.  No pericardial effusion. No evidence thoracic aortic aneurysm. Mild atherosclerotic calcifications of the aortic arch. Right dual lumen dialysis catheter  terminating at the cavoatrial junction. Mediastinum/Nodes: Small mediastinal lymph nodes which do not meet pathologic CT size criteria. Visualized thyroid is unremarkable. Lungs/Pleura: Extensive central ground-glass with coarse interstitial markings but no true interlobular septal thickening or pleural effusions. Relative subpleural sparing. No craniocaudal gradient. This appearance may reflect pulmonary edema, although given lack of associated findings, atypical pneumonia such as mycoplasma or viral pneumonia is also possible. Of note, this does not have the characteristic peripheral distribution to suggest COVID-19. Mild subpleural patchy opacity in the lateral right lung base (series 5/image 83). No pneumothorax. Upper Abdomen: Visualized upper abdomen is grossly unremarkable. Musculoskeletal: Mild degenerative changes of the visualized thoracolumbar spine. IMPRESSION: Extensive ground-glass opacity in the lungs bilaterally, possibly reflecting pulmonary edema, although atypical/viral pneumonia is also possible given the lack of associated findings. Aortic Atherosclerosis (ICD10-I70.0). Electronically Signed   By: Julian Hy M.D.   On: 11/19/2018 14:11   Ct Abdomen Pelvis W Contrast  Result Date: 10/23/2018 CLINICAL DATA:  Abdominal cramping and distension with 1 episode of vomiting. EXAM: CT ABDOMEN AND PELVIS WITH CONTRAST TECHNIQUE: Multidetector CT imaging of the abdomen and pelvis was performed using the standard protocol following bolus administration of intravenous contrast. CONTRAST:  110mL OMNIPAQUE IOHEXOL 300 MG/ML  SOLN COMPARISON:  06/21/2018 FINDINGS: Lower chest: Scarring versus atelectasis in the bilateral lower lobes, right greater than left. Hepatobiliary: No focal liver abnormality is seen. Status post cholecystectomy. No biliary dilatation. Pancreas: Unremarkable. No pancreatic ductal dilatation or surrounding inflammatory changes. Spleen: Hypoattenuated mass within the spleen  measures 14 mm, stable. There is also a stable 8 mm cyst in the inferior tip of the spleen. Adrenals/Urinary Tract: Adrenal glands are unremarkable. Kidneys are without renal calculi, focal lesion, or hydronephrosis. No contrast is seen within the collecting system on the delayed images. Bladder is unremarkable. Stomach/Bowel: Indistinctness of the walls of the duodenum without  pathologic distension. Questionable circumferential thickening of the gastric antrum. No evidence of small-bowel obstruction. The colon is decompressed. Normal appendix. Vascular/Lymphatic: Mildly enlarged lymph nodes within the porta hepatis. Stable mildly prominent lymph nodes within the pericardial fat in right cardiophrenic angle. Reproductive: Status post hysterectomy. Fat containing left ovarian mass is stable at 15 mm. Other: Moderate volume abdominopelvic ascites. Water density nodules in the right abdominal wall may represent injection sites. Musculoskeletal: Posterior facet arthropathy in the lower lumbosacral spine. IMPRESSION: Interval development of moderate in volume abdominopelvic ascites. Thickening of the walls of the gastric antrum; indistinct appearance of the duodenum, findings which may be seen with enteritis. Lack of contrast excretion within the renal collecting systems, consistent with renal failure. Stable fat containing mass on the left ovary. Electronically Signed   By: Fidela Salisbury M.D.   On: 10/23/2018 10:43   Dg Chest Port 1 View  Result Date: 11/16/2018 CLINICAL DATA:  Nausea and vomiting with cough EXAM: PORTABLE CHEST 1 VIEW COMPARISON:  10/24/2018 FINDINGS: Cardiac shadow is enlarged. Aortic calcifications are again seen. Dialysis catheter is again noted on the right. Diffuse alveolar opacities are identified consistent with acute pulmonary edema no focal effusion is seen. Slight increased density is noted in the right base which may represent a superimposed infiltrate. IMPRESSION: Changes  consistent with pulmonary edema bilaterally with likely superimposed infiltrate in the right base. Electronically Signed   By: Inez Catalina M.D.   On: 11/16/2018 05:01   Dg Chest Portable 1 View  Result Date: 10/23/2018 CLINICAL DATA:  Patient with cough and shortness of breath. EXAM: PORTABLE CHEST 1 VIEW COMPARISON:  Chest radiograph 10/05/2018 FINDINGS: Dual lumen central venous catheter tip projects over the superior vena cava. Monitoring leads overlie the patient. Stable cardiomegaly. Aortic atherosclerosis. Interval development patchy consolidative opacities right lower lung. Slight interval improvement bilateral interstitial pulmonary opacities. No pleural effusion or pneumothorax. IMPRESSION: Interval development peripheral consolidative opacities right lower lung concerning for infection in the appropriate clinical setting. Followup PA and lateral chest X-ray is recommended in 3-4 weeks following trial of antibiotic therapy to ensure resolution and exclude underlying malignancy. Electronically Signed   By: Lovey Newcomer M.D.   On: 10/23/2018 09:07   Dg Chest Port 1v Same Day  Result Date: 11/18/2018 CLINICAL DATA:  Shortness of breath. EXAM: PORTABLE CHEST 1 VIEW COMPARISON:  Radiographs 11/2018 and 10/24/2018. Abdominal CT 10/23/2018. FINDINGS: 1403 hours. The right IJ hemodialysis catheter is unchanged, tip at the superior cavoatrial junction. There is stable cardiomegaly and aortic atherosclerosis. The bilateral airspace opacities have improved, most consistent with resolving edema. Residual asymmetric opacity at the right costophrenic angle appears chronic, likely scarring. No significant pleural effusion. IMPRESSION: Improving pulmonary edema. Electronically Signed   By: Richardean Sale M.D.   On: 11/18/2018 14:35     LOS: 3 days   Oren Binet, MD  [facility]  If 7PM-7AM, please contact night-coverage  Please page via [email or web address]  Go to [email or web address] and use [place]'s  universal password to access. If you do not have the password, please contact the hospital operator.  Locate the [facility] provider you are looking for under [facility] and page to a number that you can be directly reached. If you still have difficulty reaching the provider, please page the [facility] (Director on Call) for the Hospitalists listed on amion for assistance.  11/20/2018, 1:44 PM

## 2018-11-20 NOTE — Progress Notes (Signed)
NAME:  Rhonda Lynch, MRN:  [id number], DOB:  08-12-1956, LOS: 3 ADMISSION DATE:  11/16/2018, CONSULTATION DATE: 11/19/2018 REFERRING MD:  Nena Alexander MD, CHIEF COMPLAINT: Hemoptysis, bilateral infiltrates.  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  Initially felt to be volume overload vs pneumonia. She was treated with antibiotics (Ceftriaxone, azithromycin] and underwent dialysis with no improvement in symptoms.  Developed hemoptysis on 3/10.  Chest CT 3/11 shows bilateral GGO pulmonary infiltrates and pulmonary consulted for help with management.  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  Received Plex, pulse dose steroids with prednisone taper and p.o. Cytoxan at that time.  She is no longer on immunosuppression.  Significant Hospital Events     Consults:  Nephrology, PCCM  Procedures:    Significant Diagnostic Tests:  Chest x-ray 11/16/2018-diffuse bilateral infiltrates Chest x-ray 11/18/2018- improving bilateral infiltrates CT chest 11/19/2018- extensive groundglass opacities in the lung bilaterally. I have reviewed the images personally  Micro Data:  Blood culture 11/16/2018- no growth Respiratory virus panel 11/17/2018- negative  Antimicrobials:  Ceftriaxone 3/8 > Azithromycin 3/8 >  Interim history/subjective:  Hemoptysis resolved, none today per pt. On room air, breathing comfortably.  Objective   Blood pressure 119/74, pulse 95, temperature 97.9 F (36.6 C),  temperature source Oral, resp. rate 18, height 5\' 1"  (1.549 m), weight 56.4 kg, SpO2 96 %.        Intake/Output Summary (Last 24 hours) at 11/20/2018 1535 Last data filed at 11/20/2018 1227 Gross per 24 hour  Intake 212.2 ml  Output 2800 ml  Net -2587.8 ml   Filed Weights   11/20/18 0510 11/20/18 0650 11/20/18 1030  Weight: 60.4 kg 58.9 kg 56.4 kg    Examination: General: Adult female, eating while lying with head elevated in bed, in NAD. Neuro: A&O x 3, no deficits. HEENT: [place]/AT. Sclerae anicteric, EOMI. Cardiovascular: RRR, no M/R/G.  Lungs: Respirations even and unlabored.  CTA bilaterally. Abdomen: BS x 4, soft, NT/ND.  Musculoskeletal: No gross deformities, no edema.  Skin: Intact, warm, no rashes.   Assessment & Plan:  63 year old with biopsy-proven ANCA vasculitis, end-stage renal disease, BOOP, cocaine user Admitted with hypoxic respiratory failure with bilateral pulmonary infiltrates and minor hemoptysis  Cause of hemoptysis was unclear but has now resolved.  Could be from any number of etiologies including pneumonia, volume overload, cocaine use [she had been using cocaine up to the time of admission] or recurrence of vasculitis. Overall she is getting better with improving trajectory of bilateral infiltrates on chest x-ray from admission.  The CT scan from 3/11 did show groundglass opacities but it is hard to compare this directly with the recent chest x-rays.   Continue to hold off on steroids Continue antibiotics empirically x 7 days Cocaine cessation counseling Follow CXR intermittently If she continues to improve then no other interventions will be needed If she worsens then please call us back and we can consider pulse steroids and Cytoxan (doubtful to need this)   Nothing further to add.  PCCM will sign off.  Please do not hesitate to  call us back if we can be of any further assistance.   Montey Hora, [facility]  Pager: [phone].  If no answer, [phone] 11/20/2018, 3:42 PM

## 2018-11-20 NOTE — Plan of Care (Signed)
Continue plan of care, no coughing up blood noted so far today. No report of coughing up blood from dialysis staff while pt was in HD this morning.  Pt sleepy post HD, more awake this evening.

## 2018-11-20 NOTE — Progress Notes (Signed)
Pt transported to dialysis by transporter. Will continue to monitor pt. Dijuan Sleeth Thacker, RN 

## 2018-11-20 NOTE — Procedures (Signed)
I was present at this dialysis session. I have reviewed the session itself and made appropriate changes.   Seen on HD.  On RA, nl wob.  2K 3L UF goal using TDC.  LUE AVG +B/T.    Pulm following for dyspnea and hemoptysis.  Hb stable.    Filed Weights   11/18/18 0720 11/18/18 1127 11/20/18 0510  Weight: 61.2 kg 58.2 kg 60.4 kg    Recent Labs  Lab 11/18/18 0736  NA 136  K 3.8  CL 98  CO2 25  GLUCOSE 156*  BUN 42*  CREATININE 7.32*  CALCIUM 8.1*  PHOS 6.7*    Recent Labs  Lab 11/17/18 0423 11/18/18 0736 11/20/18 0716  WBC 12.8* 9.9 10.9*  NEUTROABS  --  6.5  --   HGB 9.5* 9.4* 10.7*  HCT 30.1* 31.3* 34.9*  MCV 79.2* 81.9 85.1  PLT 237 PLATELET CLUMPS NOTED ON SMEAR, COUNT APPEARS ADEQUATE 236    Scheduled Meds: . sodium chloride   Intravenous Once  . azithromycin  500 mg Oral Q24H  . Chlorhexidine Gluconate Cloth  6 each Topical Q0600  . darbepoetin (ARANESP) injection - DIALYSIS  150 mcg Intravenous Q Tue-HD  . diphenhydrAMINE      . doxercalciferol      . doxercalciferol  4 mcg Intravenous Q T,Th,Sa-HD  . feeding supplement (PRO-STAT SUGAR FREE 64)  30 mL Oral BID  . heparin  3,000 Units Dialysis Once in dialysis  . heparin  5,000 Units Subcutaneous Q8H  . insulin aspart  0-5 Units Subcutaneous QHS  . insulin aspart  0-9 Units Subcutaneous TID WC  . multivitamin  1 tablet Oral QHS  . mupirocin ointment  1 application Nasal BID   Continuous Infusions: . sodium chloride    . sodium chloride    . cefTRIAXone (ROCEPHIN)  IV 1 g (11/19/18 1013)   PRN Meds:.sodium chloride, sodium chloride, albuterol, alteplase, benzonatate, diphenhydrAMINE, guaiFENesin, heparin, ibuprofen, lidocaine (PF), lidocaine-prilocaine, pentafluoroprop-tetrafluoroeth, polyethylene glycol, traMADol, traZODone   Pearson Grippe  MD 11/20/2018, 9:07 AM

## 2018-11-21 DIAGNOSIS — J9601 Acute respiratory failure with hypoxia: Secondary | ICD-10-CM

## 2018-11-21 LAB — GLUCOSE, CAPILLARY
GLUCOSE-CAPILLARY: 105 mg/dL — AB (ref 70–99)
Glucose-Capillary: 168 mg/dL — ABNORMAL HIGH (ref 70–99)

## 2018-11-21 LAB — CULTURE, BLOOD (ROUTINE X 2)
Culture: NO GROWTH
Culture: NO GROWTH
Special Requests: ADEQUATE
Special Requests: ADEQUATE

## 2018-11-21 LAB — GLOMERULAR BASEMENT MEMBRANE ANTIBODIES: GBM Ab: 3 units (ref 0–20)

## 2018-11-21 LAB — PROCALCITONIN: Procalcitonin: 1.63 ng/mL

## 2018-11-21 MED ORDER — PRO-STAT SUGAR FREE PO LIQD: 30.0000 mL | Freq: Two times a day (BID) | ORAL | 0 refills | Status: DC

## 2018-11-21 MED ORDER — CEFDINIR 300 MG PO CAPS: 300.0000 mg | ORAL_CAPSULE | ORAL | Status: DC

## 2018-11-21 MED ORDER — CEFDINIR 300 MG PO CAPS: 300.0000 mg | ORAL_CAPSULE | ORAL | 0 refills | Status: AC

## 2018-11-21 MED ORDER — DIPHENHYDRAMINE HCL 25 MG PO CAPS
25.0000 mg | ORAL_CAPSULE | Freq: Once | ORAL | Status: AC
  Administered 2018-11-21: 25 mg via ORAL

## 2018-11-21 MED ORDER — PANTOPRAZOLE SODIUM 40 MG PO TBEC: 40.0000 mg | DELAYED_RELEASE_TABLET | Freq: Every day | ORAL | 0 refills | Status: DC

## 2018-11-21 NOTE — Care Management Note (Signed)
Case Management Note  Patient Details  Name: Rhonda Lynch MRN: [id number] Date of Birth: [birth year]  Subjective/Objective:     Acute hypoxic respiratory failure 2/2 PNA/ pulmonary edema. Pt missed HD session.Hx of ESRD / hemodialysis, type 2 diabetes on insulin, [place], substance use disorder PE, history of ICH, severe noncompliance to medications and outpatient dialysis, frequent hospitalization            Rhonda Lynch (Son)     [id number]      PCP: Rhonda Lynch   Action/Plan: Transition to home with self care. Friend to provide transportation to home.   Expected Discharge Date:  11/21/18               Expected Discharge Plan:  Home/Self Care  In-House Referral:     Discharge planning Services  CM Consult  Post Acute Care Choice:  NA Choice offered to:  Patient(Pt declined home health services. States she doesn't need it!)  DME Arranged:  N/A DME Agency:  NA  HH Arranged:  NA(Declined home health services) [place]:  NA  Status of Service:  Completed, signed off  If discussed at [place] of Stay Meetings, dates discussed:    Additional Comments:  Rhonda Mons, RN 11/21/2018, 12:24 PM

## 2018-11-21 NOTE — Progress Notes (Signed)
Discharge instructions reviewed with pt.  Copy of instructions and scripts given to pt. Pt instructed and encouraged to have scripts filled and take medications, pt has 1 more day of antibiotic to complete her course. Pt verbalized understanding and states she will have scripts filled for antibiotic and protonix, pt has been declining the protein pro-stat here at hospital and states she will not get that medication filled.  Pt d/c'd via wheelchair with belongings, with friend picking her up at main entrance.           Escorted by unit NT.

## 2018-11-21 NOTE — Progress Notes (Signed)
Notified Bodenhemir, NP that pt's abdomen is hurting and she is itching. Benadryl not due till 0800 and Ultram due at 0340. NP stated to give Ultram now and will order Benadryl po x1. Will continue to monitor pt. Ranelle Oyster, RN

## 2018-11-21 NOTE — Discharge Summary (Signed)
PATIENT DETAILS Name: Rhonda Lynch Age: 63 y.o. Sex: female Date of Birth: 03-Aug-1956 MRN: [id number]. Admitting Physician: Barb Merino, MD NOB:SJGGE, Lesli Albee, PA-C  Admit Date: 11/16/2018 Discharge date: 11/21/2018  Recommendations for Outpatient Follow-up:  1. Follow up with PCP in 1-2 weeks 2. Please obtain BMP/CBC in one week 3. Please ensure follow-up at hemodialysis center, and at pulmonology clinic 4. 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 Shortness of breath improved following HD x2, however now has developed hemoptysis.  Brief Hospital Course: Acute hypoxic respiratory failure secondary to pneumonia and pulmonary edema in the setting of missed hemodialysis: Overall improved-with empiric antimicrobial therapy and hemodialysis.  Respiratory virus panel negative, blood cultures negative.    Management Rocephin/Zithromax-we will continue antimicrobial therapy for 1 additional day post discharge to complete a 7-day course.  Patient will require repeat chest x-ray in 3 to 4 weeks to document resolution of pneumonia-this is been deferred to outpatient MDs.  Hemoptysis:  Resolved-no hemoptysis for almost 2 days-givenprior history of BOOP and ANCA related vasculitis-pulmonology consulted-recommendations were for supportive care-since hemoptysis  has resolved on its own.  Patient will require close outpatient follow-up with pulmonology.   Epigastric/abdominal pain:Resolved-continue supportive care.  Anemia: Has anemia secondary to CKD at baseline-suspect drop in hemoglobin is likely secondary to acute illness. Received 2 units of PRBC during this hospital stay-hemoglobin is 10.7.  Follow periodically.   DM-2:CBG stable-continue SSI  Hypertension: Blood pressure controlled-continue manidipine on discharge  Procedures/Studies: None  Discharge Diagnoses:  Principal Problem:   PNEUMONIA Active Problems:   TOBACCO ABUSE   ESRD (end stage renal disease) (HCC)   Microcytic anemia   Pneumonia   Hypertension   Diabetes ([place])   Non-compliance with renal dialysis ([place])   End-stage renal disease on hemodialysis ([place])   CAP (community acquired pneumonia)   Discharge Instructions:  Activity:  As tolerated   Discharge Instructions    Diet - low sodium heart healthy   Complete by:  As directed    Diet Carb Modified   Complete by:  As directed    Discharge instructions   Complete by:  As directed    Follow with Primary MD  Clent Demark, PA-C in 1 week  Follow with hemodialysis clinic at the usual schedule  Follow with [facility] in next 1-2 weeks  Stop smoking and stop doing cocaine  Please get a complete blood count and chemistry panel checked by your Primary MD at your next visit, and again as instructed by your Primary MD.  Get Medicines reviewed and adjusted: Please take all your medications with you for your next visit with your Primary MD  Laboratory/radiological data: Please request your Primary MD to go over all hospital tests and procedure/radiological results at the follow up, please ask your Primary MD to get all Hospital records sent to his/her office.  In some cases, they will be blood  work, cultures and biopsy results pending at the time of your discharge. Please request that your  primary care M.D. follows up on these results.  Also Note the following: If you experience worsening of your admission symptoms, develop shortness of breath, life threatening emergency, suicidal or homicidal thoughts you must seek medical attention immediately by calling 911 or calling your MD immediately  if symptoms less severe.  You must read complete instructions/literature along with all the possible adverse reactions/side effects for all the Medicines you take and that have been prescribed to you. Take any new Medicines after you have completely understood and accpet all the possible adverse reactions/side effects.   Do not drive when taking Pain medications or sleeping medications (Benzodaizepines)  Do not take more than prescribed Pain, Sleep and Anxiety Medications. It is not advisable to combine anxiety,sleep and pain medications without talking with your primary care practitioner  Special Instructions: If you have smoked or chewed Tobacco  in the last 2 yrs please stop smoking, stop any regular Alcohol  and or any Recreational drug use.  Wear Seat belts while driving.  Please note: You were cared for by a hospitalist during your hospital stay. Once you are discharged, your primary care physician will handle any further medical issues. Please note that NO REFILLS for any discharge medications will be authorized once you are discharged, as it is imperative that you return to your primary care physician (or establish a relationship with a primary care physician if you do not have one) for your post hospital discharge needs so that they can reassess your need for medications and monitor your lab values.   Increase activity slowly   Complete by:  As directed      Allergies as of 11/21/2018      Reactions   Acetaminophen Nausea And Vomiting   Patient states Acetaminophen and Acetaminophen containing products make her nauseated. She demonstrated this 06/21/18 with nausea followed by  emesis.   Hydroxyzine Itching      Medication List    TAKE these medications   Accu-Chek Aviva device Use as instructed   accu-chek soft touch lancets Use twice per day.   amLODipine 5 MG tablet Commonly known as:  NORVASC Take 1 tablet (5 mg total) by mouth at bedtime.   calcitRIOL 0.25 MCG capsule Commonly known as:  ROCALTROL Take 1 capsule (0.25 mcg total) by mouth every Monday, Wednesday, and Friday with hemodialysis.   cefdinir 300 MG capsule Commonly known as:  OMNICEF Take 1 capsule (300 mg total) by mouth every other day for 1 day. Start taking on:  November 22, 2018   Dialyvite 800-Zinc 15 0.8 MG Tabs Take 1 tablet by mouth daily.   feeding supplement (PRO-STAT SUGAR FREE 64) Liqd Take 30 mLs by mouth 2 (two) times daily.   glucose blood test strip Commonly known as:  Accu-Chek Aviva Use twice per day.   insulin aspart 100 UNIT/ML injection Commonly known as:  novoLOG Inject 5 Units into the skin 3 (three) times daily with meals. What changed:  how much to take   insulin glargine 100 UNIT/ML injection Commonly known as:  LANTUS Inject 0.03 mLs (3 Units total) into the skin at bedtime. What changed:  how much to take   INSULIN SYRINGE .3CC/31GX5/16" 31G X 5/16" 0.3 ML Misc Inject 1 each into the skin 4 (four) times daily.   multivitamin Tabs tablet Take 1 tablet by mouth at bedtime.   pantoprazole 40 MG tablet Commonly known  as:  Protonix Take 1 tablet (40 mg total) by mouth daily.      Follow-up Information    Clent Demark, PA-C. Schedule an appointment as soon as possible for a visit in 1 week(s).   Specialty:  Physician Assistant Contact information: [place] [address] [phone]        Hemodialysis center Follow up.   Why:  At your usual schedule       Mannam, Praveen, MD. Schedule an appointment as soon as possible for a visit in 2 week(s).   Specialty:  Pulmonary Disease Contact information: [address] [place] [address] [phone]          Allergies  Allergen Reactions   Acetaminophen Nausea And Vomiting    Patient states Acetaminophen and Acetaminophen containing products make her nauseated. She demonstrated this 06/21/18 with nausea followed by emesis.   Hydroxyzine Itching    Consultations:   nephrology   Other Procedures/Studies: Dg Chest 2 View  Result Date: 11/20/2018 CLINICAL DATA:  Acute respiratory failure. EXAM: CHEST - 2 VIEW COMPARISON:  11/18/2018 FINDINGS: Cardiomegaly and bilateral interstitial opacities are again noted. A RIGHT IJ central venous catheter is noted with tip overlying the LOWER SVC. No pleural effusion or pneumothorax. No acute bony abnormalities. IMPRESSION: Unchanged appearance of the chest with cardiomegaly and bilateral interstitial opacities. Electronically Signed   By: Margarette Canada M.D.   On: 11/20/2018 14:28   Dg Chest 2 View  Result Date: 10/24/2018 CLINICAL DATA:  Shortness of breath. Right lower lobe consolidation. EXAM: CHEST - 2 VIEW COMPARISON:  Radiograph October 23, 2018. FINDINGS: Stable cardiomegaly. No pneumothorax or pleural effusion is noted. Stable position of right internal jugular dialysis catheter. Left lung is clear. Stable opacity seen laterally in right lung base consistent with scarring, infiltrate or atelectasis. The visualized skeletal structures are unremarkable. IMPRESSION: Stable right basilar opacity as noted on prior exam. Potentially this may represent pneumonia, atelectasis or scarring. Followup PA and lateral chest X-ray is recommended in 3-4 weeks following trial of antibiotic therapy to ensure resolution and exclude underlying malignancy. Electronically Signed   By: Marijo Conception, M.D.   On: 10/24/2018 11:36   Ct Chest W Contrast  Result Date: 11/19/2018 CLINICAL DATA:  Hemoptysis x 2-3 days EXAM: CT CHEST WITH CONTRAST TECHNIQUE: Multidetector CT imaging of the chest was performed  during intravenous contrast administration. CONTRAST:  43mL OMNIPAQUE IOHEXOL 300 MG/ML  SOLN COMPARISON:  Chest radiographs dated 11/18/2018 FINDINGS: Cardiovascular: Heart is normal in size.  No pericardial effusion. No evidence thoracic aortic aneurysm. Mild atherosclerotic calcifications of the aortic arch. Right dual lumen dialysis catheter terminating at the cavoatrial junction. Mediastinum/Nodes: Small mediastinal lymph nodes which do not meet pathologic CT size criteria. Visualized thyroid is unremarkable. Lungs/Pleura: Extensive central ground-glass with coarse interstitial markings but no true interlobular septal thickening or pleural effusions. Relative subpleural sparing. No craniocaudal gradient. This appearance may reflect pulmonary edema, although given lack of associated findings, atypical pneumonia such as mycoplasma or viral pneumonia is also possible. Of note, this does not have the characteristic peripheral distribution to suggest COVID-19. Mild subpleural patchy opacity in the lateral right lung base (series 5/image 83). No pneumothorax. Upper Abdomen: Visualized upper abdomen is grossly unremarkable. Musculoskeletal: Mild degenerative changes of the visualized thoracolumbar spine. IMPRESSION: Extensive ground-glass opacity in the lungs bilaterally, possibly reflecting pulmonary edema, although atypical/viral pneumonia is also possible given the lack of associated findings. Aortic Atherosclerosis (ICD10-I70.0). Electronically  Signed   By: Julian Hy M.D.   On: 11/19/2018 14:11   Ct Abdomen Pelvis W Contrast  Result Date: 10/23/2018 CLINICAL DATA:  Abdominal cramping and distension with 1 episode of vomiting. EXAM: CT ABDOMEN AND PELVIS WITH CONTRAST TECHNIQUE: Multidetector CT imaging of the abdomen and pelvis was performed using the standard protocol following bolus administration of intravenous contrast. CONTRAST:  189mL OMNIPAQUE IOHEXOL 300 MG/ML  SOLN COMPARISON:  06/21/2018  FINDINGS: Lower chest: Scarring versus atelectasis in the bilateral lower lobes, right greater than left. Hepatobiliary: No focal liver abnormality is seen. Status post cholecystectomy. No biliary dilatation. Pancreas: Unremarkable. No pancreatic ductal dilatation or surrounding inflammatory changes. Spleen: Hypoattenuated mass within the spleen measures 14 mm, stable. There is also a stable 8 mm cyst in the inferior tip of the spleen. Adrenals/Urinary Tract: Adrenal glands are unremarkable. Kidneys are without renal calculi, focal lesion, or hydronephrosis. No contrast is seen within the collecting system on the delayed images. Bladder is unremarkable. Stomach/Bowel: Indistinctness of the walls of the duodenum without pathologic distension. Questionable circumferential thickening of the gastric antrum. No evidence of small-bowel obstruction. The colon is decompressed. Normal appendix. Vascular/Lymphatic: Mildly enlarged lymph nodes within the porta hepatis. Stable mildly prominent lymph nodes within the pericardial fat in right cardiophrenic angle. Reproductive: Status post hysterectomy. Fat containing left ovarian mass is stable at 15 mm. Other: Moderate volume abdominopelvic ascites. Water density nodules in the right abdominal wall may represent injection sites. Musculoskeletal: Posterior facet arthropathy in the lower lumbosacral spine. IMPRESSION: Interval development of moderate in volume abdominopelvic ascites. Thickening of the walls of the gastric antrum; indistinct appearance of the duodenum, findings which may be seen with enteritis. Lack of contrast excretion within the renal collecting systems, consistent with renal failure. Stable fat containing mass on the left ovary. Electronically Signed   By: Fidela Salisbury M.D.   On: 10/23/2018 10:43   Dg Chest Port 1 View  Result Date: 11/16/2018 CLINICAL DATA:  Nausea and vomiting with cough EXAM: PORTABLE CHEST 1 VIEW COMPARISON:  10/24/2018 FINDINGS:  Cardiac shadow is enlarged. Aortic calcifications are again seen. Dialysis catheter is again noted on the right. Diffuse alveolar opacities are identified consistent with acute pulmonary edema no focal effusion is seen. Slight increased density is noted in the right base which may represent a superimposed infiltrate. IMPRESSION: Changes consistent with pulmonary edema bilaterally with likely superimposed infiltrate in the right base. Electronically Signed   By: Inez Catalina M.D.   On: 11/16/2018 05:01   Dg Chest Portable 1 View  Result Date: 10/23/2018 CLINICAL DATA:  Patient with cough and shortness of breath. EXAM: PORTABLE CHEST 1 VIEW COMPARISON:  Chest radiograph 10/05/2018 FINDINGS: Dual lumen central venous catheter tip projects over the superior vena cava. Monitoring leads overlie the patient. Stable cardiomegaly. Aortic atherosclerosis. Interval development patchy consolidative opacities right lower lung. Slight interval improvement bilateral interstitial pulmonary opacities. No pleural effusion or pneumothorax. IMPRESSION: Interval development peripheral consolidative opacities right lower lung concerning for infection in the appropriate clinical setting. Followup PA and lateral chest X-ray is recommended in 3-4 weeks following trial of antibiotic therapy to ensure resolution and exclude underlying malignancy. Electronically Signed   By: Lovey Newcomer M.D.   On: 10/23/2018 09:07   Dg Chest Port 1v Same Day  Result Date: 11/18/2018 CLINICAL DATA:  Shortness of breath. EXAM: PORTABLE CHEST 1 VIEW COMPARISON:  Radiographs 11/2018 and 10/24/2018. Abdominal CT 10/23/2018. FINDINGS: 1403 hours. The right IJ hemodialysis catheter is unchanged, tip  at the superior cavoatrial junction. There is stable cardiomegaly and aortic atherosclerosis. The bilateral airspace opacities have improved, most consistent with resolving edema. Residual asymmetric opacity at the right costophrenic angle appears chronic, likely  scarring. No significant pleural effusion. IMPRESSION: Improving pulmonary edema. Electronically Signed   By: Richardean Sale M.D.   On: 11/18/2018 14:35     TODAY-DAY OF DISCHARGE:  Subjective:   Rhonda Lynch today has no headache,no chest abdominal pain,no new weakness tingling or numbness, feels much better wants to go home today.   Objective:   Blood pressure (!) 143/81, pulse 97, temperature 97.9 F (36.6 C), temperature source Oral, resp. rate 18, height 5\' 1"  (1.549 m), weight 58.9 kg, SpO2 95 %.  Intake/Output Summary (Last 24 hours) at 11/21/2018 1027 Last data filed at 11/21/2018 0900 Gross per 24 hour  Intake 840 ml  Output 2300 ml  Net -1460 ml   Filed Weights   11/20/18 0650 11/20/18 1030 11/21/18 0635  Weight: 58.9 kg 56.4 kg 58.9 kg    Exam: Awake Alert, Oriented *3, No new F.N deficits, Normal affect [place].AT,PERRAL Supple Neck,No JVD, No cervical lymphadenopathy appriciated.  Symmetrical Chest wall movement, Good air movement bilaterally, CTAB RRR,No Gallops,Rubs or new Murmurs, No Parasternal Heave +ve B.Sounds, Abd Soft, Non tender, No organomegaly appriciated, No rebound -guarding or rigidity. No Cyanosis, Clubbing or edema, No new Rash or bruise   PERTINENT RADIOLOGIC STUDIES: Dg Chest 2 View  Result Date: 11/20/2018 CLINICAL DATA:  Acute respiratory failure. EXAM: CHEST - 2 VIEW COMPARISON:  11/18/2018 FINDINGS: Cardiomegaly and bilateral interstitial opacities are again noted. A RIGHT IJ central venous catheter is noted with tip overlying the LOWER SVC. No pleural effusion or pneumothorax. No acute bony abnormalities. IMPRESSION: Unchanged appearance of the chest with cardiomegaly and bilateral interstitial opacities. Electronically Signed   By: Margarette Canada M.D.   On: 11/20/2018 14:28   Dg Chest 2 View  Result Date: 10/24/2018 CLINICAL DATA:  Shortness of breath. Right lower lobe consolidation. EXAM: CHEST - 2 VIEW COMPARISON:  Radiograph October 23, 2018. FINDINGS: Stable cardiomegaly. No pneumothorax or pleural effusion is noted. Stable position of right internal jugular dialysis catheter. Left lung is clear. Stable opacity seen laterally in right lung base consistent with scarring, infiltrate or atelectasis. The visualized skeletal structures are unremarkable. IMPRESSION: Stable right basilar opacity as noted on prior exam. Potentially this may represent pneumonia, atelectasis or scarring. Followup PA and lateral chest X-ray is recommended in 3-4 weeks following trial of antibiotic therapy to ensure resolution and exclude underlying malignancy. Electronically Signed   By: Marijo Conception, M.D.   On: 10/24/2018 11:36   Ct Chest W Contrast  Result Date: 11/19/2018 CLINICAL DATA:  Hemoptysis x 2-3 days EXAM: CT CHEST WITH CONTRAST TECHNIQUE: Multidetector CT imaging of the chest was performed during intravenous contrast administration. CONTRAST:  61mL OMNIPAQUE IOHEXOL 300 MG/ML  SOLN COMPARISON:  Chest radiographs dated 11/18/2018 FINDINGS: Cardiovascular: Heart is normal in size.  No pericardial effusion. No evidence thoracic aortic aneurysm. Mild atherosclerotic calcifications of the aortic arch. Right dual lumen dialysis catheter terminating at the cavoatrial junction. Mediastinum/Nodes: Small mediastinal lymph nodes which do not meet pathologic CT size criteria. Visualized thyroid is unremarkable. Lungs/Pleura: Extensive central ground-glass with coarse interstitial markings but no true interlobular septal thickening or pleural effusions. Relative subpleural sparing. No craniocaudal gradient. This appearance may reflect pulmonary edema, although given lack of associated findings, atypical pneumonia such as mycoplasma or viral pneumonia is also possible.  Of note, this does not have the characteristic peripheral distribution to suggest COVID-19. Mild subpleural patchy opacity in the lateral right lung base (series 5/image 83). No pneumothorax. Upper  Abdomen: Visualized upper abdomen is grossly unremarkable. Musculoskeletal: Mild degenerative changes of the visualized thoracolumbar spine. IMPRESSION: Extensive ground-glass opacity in the lungs bilaterally, possibly reflecting pulmonary edema, although atypical/viral pneumonia is also possible given the lack of associated findings. Aortic Atherosclerosis (ICD10-I70.0). Electronically Signed   By: Julian Hy M.D.   On: 11/19/2018 14:11   Ct Abdomen Pelvis W Contrast  Result Date: 10/23/2018 CLINICAL DATA:  Abdominal cramping and distension with 1 episode of vomiting. EXAM: CT ABDOMEN AND PELVIS WITH CONTRAST TECHNIQUE: Multidetector CT imaging of the abdomen and pelvis was performed using the standard protocol following bolus administration of intravenous contrast. CONTRAST:  178mL OMNIPAQUE IOHEXOL 300 MG/ML  SOLN COMPARISON:  06/21/2018 FINDINGS: Lower chest: Scarring versus atelectasis in the bilateral lower lobes, right greater than left. Hepatobiliary: No focal liver abnormality is seen. Status post cholecystectomy. No biliary dilatation. Pancreas: Unremarkable. No pancreatic ductal dilatation or surrounding inflammatory changes. Spleen: Hypoattenuated mass within the spleen measures 14 mm, stable. There is also a stable 8 mm cyst in the inferior tip of the spleen. Adrenals/Urinary Tract: Adrenal glands are unremarkable. Kidneys are without renal calculi, focal lesion, or hydronephrosis. No contrast is seen within the collecting system on the delayed images. Bladder is unremarkable. Stomach/Bowel: Indistinctness of the walls of the duodenum without pathologic distension. Questionable circumferential thickening of the gastric antrum. No evidence of small-bowel obstruction. The colon is decompressed. Normal appendix. Vascular/Lymphatic: Mildly enlarged lymph nodes within the porta hepatis. Stable mildly prominent lymph nodes within the pericardial fat in right cardiophrenic angle. Reproductive:  Status post hysterectomy. Fat containing left ovarian mass is stable at 15 mm. Other: Moderate volume abdominopelvic ascites. Water density nodules in the right abdominal wall may represent injection sites. Musculoskeletal: Posterior facet arthropathy in the lower lumbosacral spine. IMPRESSION: Interval development of moderate in volume abdominopelvic ascites. Thickening of the walls of the gastric antrum; indistinct appearance of the duodenum, findings which may be seen with enteritis. Lack of contrast excretion within the renal collecting systems, consistent with renal failure. Stable fat containing mass on the left ovary. Electronically Signed   By: Fidela Salisbury M.D.   On: 10/23/2018 10:43   Dg Chest Port 1 View  Result Date: 11/16/2018 CLINICAL DATA:  Nausea and vomiting with cough EXAM: PORTABLE CHEST 1 VIEW COMPARISON:  10/24/2018 FINDINGS: Cardiac shadow is enlarged. Aortic calcifications are again seen. Dialysis catheter is again noted on the right. Diffuse alveolar opacities are identified consistent with acute pulmonary edema no focal effusion is seen. Slight increased density is noted in the right base which may represent a superimposed infiltrate. IMPRESSION: Changes consistent with pulmonary edema bilaterally with likely superimposed infiltrate in the right base. Electronically Signed   By: Inez Catalina M.D.   On: 11/16/2018 05:01   Dg Chest Portable 1 View  Result Date: 10/23/2018 CLINICAL DATA:  Patient with cough and shortness of breath. EXAM: PORTABLE CHEST 1 VIEW COMPARISON:  Chest radiograph 10/05/2018 FINDINGS: Dual lumen central venous catheter tip projects over the superior vena cava. Monitoring leads overlie the patient. Stable cardiomegaly. Aortic atherosclerosis. Interval development patchy consolidative opacities right lower lung. Slight interval improvement bilateral interstitial pulmonary opacities. No pleural effusion or pneumothorax. IMPRESSION: Interval development  peripheral consolidative opacities right lower lung concerning for infection in the appropriate clinical setting. Followup PA and lateral chest X-ray  is recommended in 3-4 weeks following trial of antibiotic therapy to ensure resolution and exclude underlying malignancy. Electronically Signed   By: Lovey Newcomer M.D.   On: 10/23/2018 09:07   Dg Chest Port 1v Same Day  Result Date: 11/18/2018 CLINICAL DATA:  Shortness of breath. EXAM: PORTABLE CHEST 1 VIEW COMPARISON:  Radiographs 11/2018 and 10/24/2018. Abdominal CT 10/23/2018. FINDINGS: 1403 hours. The right IJ hemodialysis catheter is unchanged, tip at the superior cavoatrial junction. There is stable cardiomegaly and aortic atherosclerosis. The bilateral airspace opacities have improved, most consistent with resolving edema. Residual asymmetric opacity at the right costophrenic angle appears chronic, likely scarring. No significant pleural effusion. IMPRESSION: Improving pulmonary edema. Electronically Signed   By: Richardean Sale M.D.   On: 11/18/2018 14:35     PERTINENT LAB RESULTS: CBC: Recent Labs    11/20/18 0716  WBC 10.9*  HGB 10.7*  HCT 34.9*  PLT 236   CMET CMP     Component Value Date/Time   NA 132 (L) 11/20/2018 0911   NA 135 03/25/2018 1014   K 3.6 11/20/2018 0911   CL 98 11/20/2018 0911   CO2 21 (L) 11/20/2018 0911   GLUCOSE 86 11/20/2018 0911   BUN 37 (H) 11/20/2018 0911   BUN 96 (HH) 03/25/2018 1014   CREATININE 7.69 (H) 11/20/2018 0911   CALCIUM 8.5 (L) 11/20/2018 0911   CALCIUM 8.3 (L) 09/23/2018 1329   PROT 6.4 (L) 11/16/2018 0349   ALBUMIN 2.2 (L) 11/20/2018 0911   AST 31 11/16/2018 0349   ALT 16 11/16/2018 0349   ALKPHOS 83 11/16/2018 0349   BILITOT 1.0 11/16/2018 0349   GFRNONAA 5 (L) 11/20/2018 0911   GFRAA 6 (L) 11/20/2018 0911    GFR Estimated Creatinine Clearance: 6 mL/min (A) (by C-G formula based on SCr of 7.69 mg/dL (H)). No results for input(s): LIPASE, AMYLASE in the last 72 hours. No  results for input(s): CKTOTAL, CKMB, CKMBINDEX, TROPONINI in the last 72 hours. Invalid input(s): POCBNP No results for input(s): DDIMER in the last 72 hours. No results for input(s): HGBA1C in the last 72 hours. No results for input(s): CHOL, HDL, LDLCALC, TRIG, CHOLHDL, LDLDIRECT in the last 72 hours. No results for input(s): TSH, T4TOTAL, T3FREE, THYROIDAB in the last 72 hours.  Invalid input(s): FREET3 No results for input(s): VITAMINB12, FOLATE, FERRITIN, TIBC, IRON, RETICCTPCT in the last 72 hours. Coags: No results for input(s): INR in the last 72 hours.  Invalid input(s): PT Microbiology: Recent Results (from the past 240 hour(s))  Surgical PCR screen     Status: None   Collection Time: 11/16/18 11:06 AM  Result Value Ref Range Status   MRSA, PCR NEGATIVE NEGATIVE Final   Staphylococcus aureus NEGATIVE NEGATIVE Final    Comment: (NOTE) The Xpert SA Assay (FDA approved for NASAL specimens in patients 38 years of age and older), is one component of a comprehensive surveillance program. It is not intended to diagnose infection nor to guide or monitor treatment. Performed at [facility], [place] [address] [place], [place] [address]   Culture, blood (routine x 2) Call MD if unable to obtain prior to antibiotics being given     Status: None (Preliminary result)   Collection Time: 11/16/18 12:31 PM  Result Value Ref Range Status   Specimen Description BLOOD RIGHT ANTECUBITAL  Final   Special Requests   Final    BOTTLES DRAWN AEROBIC ONLY Blood Culture adequate volume   Culture   Final  NO GROWTH 4 DAYS Performed at [facility], [place] [address], [place], [place] [address]    Report Status PENDING  Incomplete  Culture, blood (routine x 2) Call MD if unable to obtain prior to antibiotics being given     Status: None (Preliminary result)   Collection Time: 11/16/18 12:38 PM  Result Value Ref Range Status   Specimen Description BLOOD RIGHT FOREARM  Final   Special  Requests   Final    BOTTLES DRAWN AEROBIC ONLY Blood Culture adequate volume   Culture   Final    NO GROWTH 4 DAYS Performed at [facility], [place] [address] [place], [place] [address]    Report Status PENDING  Incomplete  Respiratory Panel by PCR     Status: None   Collection Time: 11/17/18  7:18 AM  Result Value Ref Range Status   Adenovirus NOT DETECTED NOT DETECTED Final   Coronavirus 229E NOT DETECTED NOT DETECTED Final    Comment: (NOTE) The Coronavirus on the Respiratory Panel, DOES NOT test for the novel  Coronavirus (2019 nCoV)    Coronavirus HKU1 NOT DETECTED NOT DETECTED Final   Coronavirus NL63 NOT DETECTED NOT DETECTED Final   Coronavirus OC43 NOT DETECTED NOT DETECTED Final   Metapneumovirus NOT DETECTED NOT DETECTED Final   Rhinovirus / Enterovirus NOT DETECTED NOT DETECTED Final   Influenza A NOT DETECTED NOT DETECTED Final   Influenza B NOT DETECTED NOT DETECTED Final   Parainfluenza Virus 1 NOT DETECTED NOT DETECTED Final   Parainfluenza Virus 2 NOT DETECTED NOT DETECTED Final   Parainfluenza Virus 3 NOT DETECTED NOT DETECTED Final   Parainfluenza Virus 4 NOT DETECTED NOT DETECTED Final   Respiratory Syncytial Virus NOT DETECTED NOT DETECTED Final   Bordetella pertussis NOT DETECTED NOT DETECTED Final   Chlamydophila pneumoniae NOT DETECTED NOT DETECTED Final   Mycoplasma pneumoniae NOT DETECTED NOT DETECTED Final    Comment: Performed at [facility], [address], [place], [place] [address]    FURTHER DISCHARGE INSTRUCTIONS:  Get Medicines reviewed and adjusted: Please take all your medications with you for your next visit with your Primary MD  Laboratory/radiological data: Please request your Primary MD to go over all hospital tests and procedure/radiological results at the follow up, please ask your Primary MD to get all Hospital records sent to his/her office.  In some cases, they will be blood work, cultures and biopsy results pending at  the time of your discharge. Please request that your primary care M.D. goes through all the records of your hospital data and follows up on these results.  Also Note the following: If you experience worsening of your admission symptoms, develop shortness of breath, life threatening emergency, suicidal or homicidal thoughts you must seek medical attention immediately by calling 911 or calling your MD immediately  if symptoms less severe.  You must read complete instructions/literature along with all the possible adverse reactions/side effects for all the Medicines you take and that have been prescribed to you. Take any new Medicines after you have completely understood and accpet all the possible adverse reactions/side effects.   Do not drive when taking Pain medications or sleeping medications (Benzodaizepines)  Do not take more than prescribed Pain, Sleep and Anxiety Medications. It is not advisable to combine anxiety,sleep and pain medications without talking with your primary care practitioner  Special Instructions: If you have smoked or chewed Tobacco  in the last 2 yrs please stop smoking, stop  any regular Alcohol  and or any Recreational drug use.  Wear Seat belts while driving.  Please note: You were cared for by a hospitalist during your hospital stay. Once you are discharged, your primary care physician will handle any further medical issues. Please note that NO REFILLS for any discharge medications will be authorized once you are discharged, as it is imperative that you return to your primary care physician (or establish a relationship with a primary care physician if you do not have one) for your post hospital discharge needs so that they can reassess your need for medications and monitor your lab values.  Total Time spent coordinating discharge including counseling, education and face to face time equals 35 minutes.  SignedOren Binet 11/21/2018 10:27 AM

## 2018-11-21 NOTE — Progress Notes (Addendum)
Rhonda Lynch Progress Note   Subjective: Wants to go home. Says she feels better. No new complaints.   Objective Vitals:   11/20/18 1857 11/20/18 2134 11/21/18 0601 11/21/18 0635  BP:  123/83 (!) 143/81   Pulse:  94 97   Resp:  18 18   Temp:  99.6 F (37.6 C) 97.9 F (36.6 C)   TempSrc:  Oral Oral   SpO2: 100% 100% 95%   Weight:    58.9 kg  Height:       Physical Exam General: Pleasant older female in NAD Heart: S1,S2 RRR Lungs: CTAB A/P sl decreased RLL Abdomen: active BS Extremities:No LE edema Dialysis Access: RIJ TDC drsg CDI RUA AVG +bruit   Additional Objective Labs: Basic Metabolic Panel: Recent Labs  Lab 11/17/18 0423 11/18/18 0736 11/20/18 0911  NA 135 136 132*  K 3.9 3.8 3.6  CL 98 98 98  CO2 22 25 21*  GLUCOSE 117* 156* 86  BUN 17 42* 37*  CREATININE 4.75* 7.32* 7.69*  CALCIUM 8.0* 8.1* 8.5*  PHOS 5.1* 6.7* 5.0*   Liver Function Tests: Recent Labs  Lab 11/16/18 0349 11/16/18 1440 11/18/18 0736 11/20/18 0911  AST 31  --   --   --   ALT 16  --   --   --   ALKPHOS 83  --   --   --   BILITOT 1.0  --   --   --   PROT 6.4*  --   --   --   ALBUMIN 2.6* 2.4* 2.4* 2.2*   Recent Labs  Lab 11/16/18 0349  LIPASE 38   CBC: Recent Labs  Lab 11/16/18 0349 11/16/18 1440 11/17/18 0423 11/18/18 0736 11/20/18 0716  WBC 14.6* 12.8* 12.8* 9.9 10.9*  NEUTROABS  --   --   --  6.5  --   HGB 7.1* 6.4* 9.5* 9.4* 10.7*  HCT 23.7* 21.2* 30.1* 31.3* 34.9*  MCV 78.2* 77.9* 79.2* 81.9 85.1  PLT 244 246 237 PLATELET CLUMPS NOTED ON SMEAR, COUNT APPEARS ADEQUATE 236   Blood Culture    Component Value Date/Time   SDES BLOOD RIGHT FOREARM 11/16/2018 1238   SPECREQUEST  11/16/2018 1238    BOTTLES DRAWN AEROBIC ONLY Blood Culture adequate volume   CULT  11/16/2018 1238    NO GROWTH 4 DAYS Performed at [facility], [place] [address], [place], [place] [address]    REPTSTATUS PENDING 11/16/2018 1238    Cardiac Enzymes: No results  for input(s): CKTOTAL, CKMB, CKMBINDEX, TROPONINI in the last 168 hours. CBG: Recent Labs  Lab 11/19/18 2107 11/20/18 1153 11/20/18 1706 11/20/18 2139 11/21/18 0749  GLUCAP 140* 116* 170* 132* 168*   Iron Studies: No results for input(s): IRON, TIBC, TRANSFERRIN, FERRITIN in the last 72 hours. @lablastinr3 @ Studies/Results: Dg Chest 2 View  Result Date: 11/20/2018 CLINICAL DATA:  Acute respiratory failure. EXAM: CHEST - 2 VIEW COMPARISON:  11/18/2018 FINDINGS: Cardiomegaly and bilateral interstitial opacities are again noted. A RIGHT IJ central venous catheter is noted with tip overlying the LOWER SVC. No pleural effusion or pneumothorax. No acute bony abnormalities. IMPRESSION: Unchanged appearance of the chest with cardiomegaly and bilateral interstitial opacities. Electronically Signed   By: Margarette Canada M.D.   On: 11/20/2018 14:28   Ct Chest W Contrast  Result Date: 11/19/2018 CLINICAL DATA:  Hemoptysis x 2-3 days EXAM: CT CHEST WITH CONTRAST TECHNIQUE: Multidetector CT imaging of the chest was performed during intravenous contrast administration. CONTRAST:  65mL  OMNIPAQUE IOHEXOL 300 MG/ML  SOLN COMPARISON:  Chest radiographs dated 11/18/2018 FINDINGS: Cardiovascular: Heart is normal in size.  No pericardial effusion. No evidence thoracic aortic aneurysm. Mild atherosclerotic calcifications of the aortic arch. Right dual lumen dialysis catheter terminating at the cavoatrial junction. Mediastinum/Nodes: Small mediastinal lymph nodes which do not meet pathologic CT size criteria. Visualized thyroid is unremarkable. Lungs/Pleura: Extensive central ground-glass with coarse interstitial markings but no true interlobular septal thickening or pleural effusions. Relative subpleural sparing. No craniocaudal gradient. This appearance may reflect pulmonary edema, although given lack of associated findings, atypical pneumonia such as mycoplasma or viral pneumonia is also possible. Of note, this does not  have the characteristic peripheral distribution to suggest COVID-19. Mild subpleural patchy opacity in the lateral right lung base (series 5/image 83). No pneumothorax. Upper Abdomen: Visualized upper abdomen is grossly unremarkable. Musculoskeletal: Mild degenerative changes of the visualized thoracolumbar spine. IMPRESSION: Extensive ground-glass opacity in the lungs bilaterally, possibly reflecting pulmonary edema, although atypical/viral pneumonia is also possible given the lack of associated findings. Aortic Atherosclerosis (ICD10-I70.0). Electronically Signed   By: Julian Hy M.D.   On: 11/19/2018 14:11   Medications: . sodium chloride    . sodium chloride    . cefTRIAXone (ROCEPHIN)  IV 1 g (11/20/18 1232)   . sodium chloride   Intravenous Once  . azithromycin  500 mg Oral Q24H  . Chlorhexidine Gluconate Cloth  6 each Topical Q0600  . darbepoetin (ARANESP) injection - DIALYSIS  150 mcg Intravenous Q Tue-HD  . doxercalciferol  4 mcg Intravenous Q T,Th,Sa-HD  . feeding supplement (PRO-STAT SUGAR FREE 64)  30 mL Oral BID  . heparin  5,000 Units Subcutaneous Q8H  . insulin aspart  0-5 Units Subcutaneous QHS  . insulin aspart  0-9 Units Subcutaneous TID WC  . multivitamin  1 tablet Oral QHS  . mupirocin ointment  1 application Nasal BID     [facility] Time: 4 hours, 180NRe, BFR 400/DFR 800, EDW 59.5kg 2K, 2.5Ca bath. TDC; LUE AVG (placed 2/18) Heparin: 3000 unit bolus Mircera: 165mcg IV q 2 weeks (last dose 2/25) Venofer 100mg  IV q HD x 5 doses (1 dose given 3/5) Hectorol 4 mcg IV q HD  Assessment/Plan: 1. Dyspnea/Pulmonary edema with possible infiltrate in R lung base  on CXR.  Blood cultures neg to date x 2 days. Antibiotics per primary (azithro/ CTX). Breathing improved with HD. Pulm edema resolving on CXR. CT chest pending. H/O BOOP and ANCA related vasculitis. Plans to DC home today. F/U with pulmonary as OP.  2. ESRD -  HD TTS. Continue on schedule. HD tomorrow at OP center tomorrow. Says she will attend.  3. HTN/volume- BP ok. No BP meds.HD 11/20/18 Post wt 56.4. Appears euvolemic.  Will lower EDW at discharge.  4. Anemia-  Hgb 10.7. s/p 2 units prbcs 3/8. Aranesp 150 mcg dosed 7/51.  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 No binders yet. Follow trend. Continue Hectorol added Ca bath.  6. Nutrition - Renal diet/fluid restrictions. Prot supp for low albumin 2.2.  7. DM - insulin per primary  8. Dispo:  DC today.  Loura Pitt H. Cherylanne Ardelean NP-C 11/21/2018, 9:44 AM  [employer] [phone]

## 2018-11-23 LAB — MPO/PR-3 (ANCA) ANTIBODIES
ANCA Proteinase 3: <3.5 U/mL (ref 0.0–3.5)
Myeloperoxidase Abs: 34.1 U/mL — ABNORMAL HIGH (ref 0.0–9.0)

## 2018-11-25 ENCOUNTER — Encounter (HOSPITAL_COMMUNITY): Payer: Self-pay

## 2018-11-25 ENCOUNTER — Emergency Department (HOSPITAL_COMMUNITY): Payer: Medicare Other

## 2018-11-25 ENCOUNTER — Other Ambulatory Visit: Payer: Self-pay

## 2018-11-25 ENCOUNTER — Observation Stay (HOSPITAL_COMMUNITY)
Admission: EM | Admit: 2018-11-25 | Discharge: 2018-11-26 | Disposition: A | Discharge status: Home / Self Care | Payer: Medicare Other | Attending: Internal Medicine | Admitting: Internal Medicine

## 2018-11-25 DIAGNOSIS — R2689 Other abnormalities of gait and mobility: Secondary | ICD-10-CM | POA: Diagnosis not present

## 2018-11-25 DIAGNOSIS — B182 Chronic viral hepatitis C: Secondary | ICD-10-CM | POA: Insufficient documentation

## 2018-11-25 DIAGNOSIS — R451 Restlessness and agitation: Secondary | ICD-10-CM | POA: Diagnosis not present

## 2018-11-25 DIAGNOSIS — Z8673 Personal history of transient ischemic attack (TIA), and cerebral infarction without residual deficits: Secondary | ICD-10-CM | POA: Insufficient documentation

## 2018-11-25 DIAGNOSIS — I120 Hypertensive chronic kidney disease with stage 5 chronic kidney disease or end stage renal disease: Secondary | ICD-10-CM | POA: Insufficient documentation

## 2018-11-25 DIAGNOSIS — IMO0001 Reserved for inherently not codable concepts without codable children: Secondary | ICD-10-CM

## 2018-11-25 DIAGNOSIS — E8889 Other specified metabolic disorders: Secondary | ICD-10-CM | POA: Diagnosis not present

## 2018-11-25 DIAGNOSIS — E119 Type 2 diabetes mellitus without complications: Secondary | ICD-10-CM

## 2018-11-25 DIAGNOSIS — R4182 Altered mental status, unspecified: Secondary | ICD-10-CM | POA: Diagnosis present

## 2018-11-25 DIAGNOSIS — Z884 Allergy status to anesthetic agent status: Secondary | ICD-10-CM | POA: Insufficient documentation

## 2018-11-25 DIAGNOSIS — D509 Iron deficiency anemia, unspecified: Secondary | ICD-10-CM | POA: Diagnosis not present

## 2018-11-25 DIAGNOSIS — R7989 Other specified abnormal findings of blood chemistry: Secondary | ICD-10-CM | POA: Diagnosis not present

## 2018-11-25 DIAGNOSIS — N186 End stage renal disease: Secondary | ICD-10-CM | POA: Diagnosis not present

## 2018-11-25 DIAGNOSIS — Z7151 Drug abuse counseling and surveillance of drug abuser: Secondary | ICD-10-CM | POA: Insufficient documentation

## 2018-11-25 DIAGNOSIS — Z794 Long term (current) use of insulin: Secondary | ICD-10-CM | POA: Diagnosis not present

## 2018-11-25 DIAGNOSIS — F17210 Nicotine dependence, cigarettes, uncomplicated: Secondary | ICD-10-CM | POA: Insufficient documentation

## 2018-11-25 DIAGNOSIS — F1410 Cocaine abuse, uncomplicated: Secondary | ICD-10-CM | POA: Insufficient documentation

## 2018-11-25 DIAGNOSIS — E875 Hyperkalemia: Secondary | ICD-10-CM | POA: Diagnosis present

## 2018-11-25 DIAGNOSIS — Z888 Allergy status to other drugs, medicaments and biological substances status: Secondary | ICD-10-CM | POA: Insufficient documentation

## 2018-11-25 DIAGNOSIS — Z79899 Other long term (current) drug therapy: Secondary | ICD-10-CM | POA: Insufficient documentation

## 2018-11-25 DIAGNOSIS — Z992 Dependence on renal dialysis: Secondary | ICD-10-CM | POA: Insufficient documentation

## 2018-11-25 DIAGNOSIS — T405X1A Poisoning by cocaine, accidental (unintentional), initial encounter: Principal | ICD-10-CM | POA: Diagnosis present

## 2018-11-25 DIAGNOSIS — E1122 Type 2 diabetes mellitus with diabetic chronic kidney disease: Secondary | ICD-10-CM | POA: Insufficient documentation

## 2018-11-25 DIAGNOSIS — F17200 Nicotine dependence, unspecified, uncomplicated: Secondary | ICD-10-CM | POA: Diagnosis present

## 2018-11-25 DIAGNOSIS — R778 Other specified abnormalities of plasma proteins: Secondary | ICD-10-CM | POA: Diagnosis present

## 2018-11-25 DIAGNOSIS — I10 Essential (primary) hypertension: Secondary | ICD-10-CM | POA: Diagnosis present

## 2018-11-25 LAB — POCT I-STAT EG7
Acid-base deficit: 9 mmol/L — ABNORMAL HIGH (ref 0.0–2.0)
Bicarbonate: 17.2 mmol/L — ABNORMAL LOW (ref 20.0–28.0)
CALCIUM ION: 0.92 mmol/L — AB (ref 1.15–1.40)
HEMATOCRIT: 36 % (ref 36.0–46.0)
Hemoglobin: 12.2 g/dL (ref 12.0–15.0)
O2 Saturation: 79 %
PCO2 VEN: 35.6 mmHg — AB (ref 44.0–60.0)
Patient temperature: 37
Potassium: 5.5 mmol/L — ABNORMAL HIGH (ref 3.5–5.1)
Sodium: 136 mmol/L (ref 135–145)
TCO2: 18 mmol/L — ABNORMAL LOW (ref 22–32)
pH, Ven: 7.291 (ref 7.250–7.430)
pO2, Ven: 48 mmHg — ABNORMAL HIGH (ref 32.0–45.0)

## 2018-11-25 LAB — RENAL FUNCTION PANEL
Albumin: 2.6 g/dL — ABNORMAL LOW (ref 3.5–5.0)
Anion gap: 19 — ABNORMAL HIGH (ref 5–15)
BUN: 81 mg/dL — ABNORMAL HIGH (ref 8–23)
CALCIUM: 7.7 mg/dL — AB (ref 8.9–10.3)
CO2: 16 mmol/L — ABNORMAL LOW (ref 22–32)
Chloride: 103 mmol/L (ref 98–111)
Creatinine, Ser: 13.74 mg/dL — ABNORMAL HIGH (ref 0.44–1.00)
GFR calc Af Amer: 3 mL/min — ABNORMAL LOW (ref >60)
GFR, EST NON AFRICAN AMERICAN: 2 mL/min — AB (ref >60)
Glucose, Bld: 73 mg/dL (ref 70–99)
Phosphorus: 11.4 mg/dL — ABNORMAL HIGH (ref 2.5–4.6)
Potassium: 5 mmol/L (ref 3.5–5.1)
Sodium: 138 mmol/L (ref 135–145)

## 2018-11-25 LAB — CBC
HCT: 35.4 % — ABNORMAL LOW (ref 36.0–46.0)
HCT: 31.5 % — ABNORMAL LOW (ref 36.0–46.0)
Hemoglobin: 11.2 g/dL — ABNORMAL LOW (ref 12.0–15.0)
Hemoglobin: 10.1 g/dL — ABNORMAL LOW (ref 12.0–15.0)
MCH: 26.3 pg (ref 26.0–34.0)
MCH: 26.2 pg (ref 26.0–34.0)
MCHC: 31.6 g/dL (ref 30.0–36.0)
MCHC: 32.1 g/dL (ref 30.0–36.0)
MCV: 83.1 fL (ref 80.0–100.0)
MCV: 81.6 fL (ref 80.0–100.0)
PLATELETS: 205 10*3/uL (ref 150–400)
Platelets: 218 K/uL (ref 150–400)
RBC: 4.26 MIL/uL (ref 3.87–5.11)
RBC: 3.86 MIL/uL — ABNORMAL LOW (ref 3.87–5.11)
RDW: 22.8 % — ABNORMAL HIGH (ref 11.5–15.5)
RDW: 22 % — ABNORMAL HIGH (ref 11.5–15.5)
WBC: 11.4 K/uL — ABNORMAL HIGH (ref 4.0–10.5)
WBC: 11.2 10*3/uL — ABNORMAL HIGH (ref 4.0–10.5)
nRBC: 0.2 % (ref 0.0–0.2)
nRBC: 0 % (ref 0.0–0.2)

## 2018-11-25 LAB — ETHANOL: Alcohol, Ethyl (B): <10 mg/dL (ref <10)

## 2018-11-25 LAB — GLUCOSE, CAPILLARY
Glucose-Capillary: 56 mg/dL — ABNORMAL LOW (ref 70–99)
Glucose-Capillary: 99 mg/dL (ref 70–99)
Glucose-Capillary: 79 mg/dL (ref 70–99)
Glucose-Capillary: 58 mg/dL — ABNORMAL LOW (ref 70–99)
Glucose-Capillary: 94 mg/dL (ref 70–99)
Glucose-Capillary: 66 mg/dL — ABNORMAL LOW (ref 70–99)
Glucose-Capillary: 58 mg/dL — ABNORMAL LOW (ref 70–99)
Glucose-Capillary: 193 mg/dL — ABNORMAL HIGH (ref 70–99)

## 2018-11-25 LAB — CBG MONITORING, ED
Glucose-Capillary: 108 mg/dL — ABNORMAL HIGH (ref 70–99)
Glucose-Capillary: 91 mg/dL (ref 70–99)

## 2018-11-25 LAB — SALICYLATE LEVEL: Salicylate Lvl: <7 mg/dL (ref 2.8–30.0)

## 2018-11-25 LAB — COMPREHENSIVE METABOLIC PANEL WITH GFR
ALT: 27 U/L (ref 0–44)
AST: 41 U/L (ref 15–41)
Albumin: 3.1 g/dL — ABNORMAL LOW (ref 3.5–5.0)
Alkaline Phosphatase: 92 U/L (ref 38–126)
Anion gap: 19 — ABNORMAL HIGH (ref 5–15)
BUN: 79 mg/dL — ABNORMAL HIGH (ref 8–23)
CO2: 15 mmol/L — ABNORMAL LOW (ref 22–32)
Calcium: 7.9 mg/dL — ABNORMAL LOW (ref 8.9–10.3)
Chloride: 103 mmol/L (ref 98–111)
Creatinine, Ser: 13.53 mg/dL — ABNORMAL HIGH (ref 0.44–1.00)
GFR calc Af Amer: 3 mL/min — ABNORMAL LOW (ref >60)
GFR calc non Af Amer: 3 mL/min — ABNORMAL LOW (ref >60)
Glucose, Bld: 118 mg/dL — ABNORMAL HIGH (ref 70–99)
Potassium: 5.9 mmol/L — ABNORMAL HIGH (ref 3.5–5.1)
Sodium: 137 mmol/L (ref 135–145)
Total Bilirubin: 0.7 mg/dL (ref 0.3–1.2)
Total Protein: 7.7 g/dL (ref 6.5–8.1)

## 2018-11-25 LAB — TROPONIN I
Troponin I: 0.04 ng/mL (ref <0.03)
Troponin I: 0.04 ng/mL (ref <0.03)
Troponin I: 0.04 ng/mL (ref <0.03)

## 2018-11-25 LAB — RAPID URINE DRUG SCREEN, HOSP PERFORMED
Amphetamines: NOT DETECTED
Barbiturates: NOT DETECTED
Benzodiazepines: NOT DETECTED
Cocaine: POSITIVE — AB
Opiates: NOT DETECTED
Tetrahydrocannabinol: NOT DETECTED

## 2018-11-25 LAB — ACETAMINOPHEN LEVEL: Acetaminophen (Tylenol), Serum: <10 ug/mL — ABNORMAL LOW (ref 10–30)

## 2018-11-25 LAB — I-STAT CREATININE, ED: Creatinine, Ser: 13.9 mg/dL — ABNORMAL HIGH (ref 0.44–1.00)

## 2018-11-25 MED ORDER — CALCITRIOL 0.25 MCG PO CAPS: 0.2500 ug | ORAL_CAPSULE | ORAL | Status: DC

## 2018-11-25 MED ORDER — ENOXAPARIN SODIUM 30 MG/0.3ML ~~LOC~~ SOLN
30.0000 mg | SUBCUTANEOUS | Status: DC
  Administered 2018-11-25 – 2018-11-26 (×2): 30 mg via SUBCUTANEOUS
  Filled 2018-11-25 (×2): qty 0.3

## 2018-11-25 MED ORDER — NEPHRO-VITE 0.8 MG PO TABS
1.0000 | ORAL_TABLET | Freq: Every day | ORAL | Status: DC
  Filled 2018-11-25 (×2): qty 1

## 2018-11-25 MED ORDER — HALOPERIDOL LACTATE 5 MG/ML IJ SOLN
2.0000 mg | Freq: Once | INTRAMUSCULAR | Status: AC
  Administered 2018-11-25: 2 mg via INTRAMUSCULAR
  Filled 2018-11-25: qty 1

## 2018-11-25 MED ORDER — INSULIN GLARGINE 100 UNIT/ML ~~LOC~~ SOLN: 5.0000 [IU] | Freq: Every day | SUBCUTANEOUS | Status: DC

## 2018-11-25 MED ORDER — ALBUTEROL (5 MG/ML) CONTINUOUS INHALATION SOLN
10.0000 mg/h | INHALATION_SOLUTION | RESPIRATORY_TRACT | Status: DC
  Administered 2018-11-25: 10 mg/h via RESPIRATORY_TRACT

## 2018-11-25 MED ORDER — DEXTROSE 50 % IV SOLN
INTRAVENOUS | Status: AC
  Filled 2018-11-25: qty 50

## 2018-11-25 MED ORDER — CAMPHOR-MENTHOL 0.5-0.5 % EX LOTN
1.0000 "application " | TOPICAL_LOTION | Freq: Three times a day (TID) | CUTANEOUS | Status: DC | PRN
  Filled 2018-11-25: qty 222

## 2018-11-25 MED ORDER — CHLORHEXIDINE GLUCONATE CLOTH 2 % EX PADS
6.0000 | MEDICATED_PAD | Freq: Every day | CUTANEOUS | Status: DC
  Administered 2018-11-26: 6 via TOPICAL

## 2018-11-25 MED ORDER — INSULIN ASPART 100 UNIT/ML IV SOLN
5.0000 [IU] | Freq: Once | INTRAVENOUS | Status: AC
  Administered 2018-11-25: 5 [IU] via INTRAVENOUS

## 2018-11-25 MED ORDER — PRO-STAT SUGAR FREE PO LIQD
30.0000 mL | Freq: Two times a day (BID) | ORAL | Status: DC
  Filled 2018-11-25 (×2): qty 30

## 2018-11-25 MED ORDER — HYDROXYZINE HCL 25 MG PO TABS: 25.0000 mg | ORAL_TABLET | Freq: Three times a day (TID) | ORAL | Status: DC | PRN

## 2018-11-25 MED ORDER — DOXERCALCIFEROL 4 MCG/2ML IV SOLN
INTRAVENOUS | Status: AC
  Administered 2018-11-25: 4 ug via INTRAVENOUS
  Filled 2018-11-25: qty 2

## 2018-11-25 MED ORDER — SORBITOL 70 % SOLN
30.0000 mL | Status: DC | PRN
  Filled 2018-11-25: qty 30

## 2018-11-25 MED ORDER — DEXTROSE 50 % IV SOLN
12.5000 g | INTRAVENOUS | Status: AC
  Administered 2018-11-25: 12.5 g via INTRAVENOUS

## 2018-11-25 MED ORDER — DOCUSATE SODIUM 283 MG RE ENEM
1.0000 | ENEMA | RECTAL | Status: DC | PRN
  Filled 2018-11-25: qty 1

## 2018-11-25 MED ORDER — ZOLPIDEM TARTRATE 5 MG PO TABS: 5.0000 mg | ORAL_TABLET | Freq: Every evening | ORAL | Status: DC | PRN

## 2018-11-25 MED ORDER — SODIUM CHLORIDE 0.9% FLUSH
3.0000 mL | Freq: Two times a day (BID) | INTRAVENOUS | Status: DC
  Administered 2018-11-25 – 2018-11-26 (×3): 3 mL via INTRAVENOUS

## 2018-11-25 MED ORDER — GLUCOSE 4 G PO CHEW
CHEWABLE_TABLET | ORAL | Status: AC
  Filled 2018-11-25: qty 1

## 2018-11-25 MED ORDER — DEXTROSE 50 % IV SOLN
1.0000 | Freq: Once | INTRAVENOUS | Status: AC
  Administered 2018-11-25: 50 mL via INTRAVENOUS
  Filled 2018-11-25: qty 50

## 2018-11-25 MED ORDER — AMLODIPINE BESYLATE 5 MG PO TABS
5.0000 mg | ORAL_TABLET | Freq: Every day | ORAL | Status: DC
  Filled 2018-11-25: qty 1

## 2018-11-25 MED ORDER — ALBUTEROL SULFATE (2.5 MG/3ML) 0.083% IN NEBU
10.0000 mg | INHALATION_SOLUTION | Freq: Once | RESPIRATORY_TRACT | Status: DC
  Filled 2018-11-25: qty 12

## 2018-11-25 MED ORDER — DEXTROSE 50 % IV SOLN
INTRAVENOUS | Status: AC
  Administered 2018-11-25: 25 mL
  Filled 2018-11-25: qty 50

## 2018-11-25 MED ORDER — DOXERCALCIFEROL 4 MCG/2ML IV SOLN
4.0000 ug | INTRAVENOUS | Status: DC
  Administered 2018-11-25: 4 ug via INTRAVENOUS
  Filled 2018-11-25: qty 2

## 2018-11-25 MED ORDER — ONDANSETRON HCL 4 MG PO TABS: 4.0000 mg | ORAL_TABLET | Freq: Four times a day (QID) | ORAL | Status: DC | PRN

## 2018-11-25 MED ORDER — PANTOPRAZOLE SODIUM 40 MG PO TBEC
40.0000 mg | DELAYED_RELEASE_TABLET | Freq: Every day | ORAL | Status: DC
  Administered 2018-11-26: 40 mg via ORAL
  Filled 2018-11-25: qty 1

## 2018-11-25 MED ORDER — SODIUM BICARBONATE 8.4 % IV SOLN
50.0000 meq | Freq: Once | INTRAVENOUS | Status: AC
  Administered 2018-11-25: 50 meq via INTRAVENOUS
  Filled 2018-11-25: qty 50

## 2018-11-25 MED ORDER — DIALYVITE 800-ZINC 15 0.8 MG PO TABS: 1.0000 | ORAL_TABLET | Freq: Every day | ORAL | Status: DC

## 2018-11-25 MED ORDER — INSULIN ASPART 100 UNIT/ML ~~LOC~~ SOLN
0.0000 [IU] | Freq: Three times a day (TID) | SUBCUTANEOUS | Status: DC
  Administered 2018-11-26: 2 [IU] via SUBCUTANEOUS
  Administered 2018-11-26: 1 [IU] via SUBCUTANEOUS

## 2018-11-25 MED ORDER — DEXTROSE 50 % IV SOLN
12.5000 g | INTRAVENOUS | Status: AC
  Administered 2018-11-25: 12.5 g via INTRAVENOUS
  Filled 2018-11-25: qty 50

## 2018-11-25 MED ORDER — CALCIUM CARBONATE ANTACID 1250 MG/5ML PO SUSP
500.0000 mg | Freq: Four times a day (QID) | ORAL | Status: DC | PRN
  Filled 2018-11-25: qty 5

## 2018-11-25 MED ORDER — SODIUM ZIRCONIUM CYCLOSILICATE 10 G PO PACK
10.0000 g | PACK | ORAL | Status: AC
  Filled 2018-11-25: qty 1

## 2018-11-25 MED ORDER — NEPRO/CARBSTEADY PO LIQD
237.0000 mL | Freq: Three times a day (TID) | ORAL | Status: DC | PRN
  Administered 2018-11-26: 237 mL via ORAL
  Filled 2018-11-25: qty 237

## 2018-11-25 MED ORDER — ONDANSETRON HCL 4 MG/2ML IJ SOLN: 4.0000 mg | Freq: Four times a day (QID) | INTRAMUSCULAR | Status: DC | PRN

## 2018-11-25 MED ORDER — RENA-VITE PO TABS: 1.0000 | ORAL_TABLET | Freq: Every day | ORAL | Status: DC

## 2018-11-25 MED ORDER — CALCIUM GLUCONATE-NACL 1-0.675 GM/50ML-% IV SOLN
1.0000 g | Freq: Once | INTRAVENOUS | Status: AC
  Administered 2018-11-25: 1000 mg via INTRAVENOUS
  Filled 2018-11-25: qty 50

## 2018-11-25 MED ORDER — GLUCOSE 40 % PO GEL
1.0000 | ORAL | Status: AC
  Administered 2018-11-25: 37.5 g via ORAL
  Filled 2018-11-25: qty 1

## 2018-11-25 NOTE — H&P (Signed)
History and Physical    Rhonda Lynch IOE:[id number] DOB: [birth year] DOA: 11/25/2018  PCP: Clent Demark, PA-C Consultants:  Carlis Abbott - vascular; Nephrology  Chief Complaint: overdose  HPI: Rhonda Lynch is a 63 y.o. female with medical history significant of ESRD-HD (TTS), HCV, polysubstance abuse (cocaine, heroin, tobacco), hypertension, diabetes mellitus, who presents with drug overdose (cocaine).  The patient is unable to provide history.  While awake and alert, she has nonsensical speech ("1234, 1234").  She was reportedly "found in the street and told EMS she must have taken too much insulin."  She was previously admitted from 3/8-13 for acute hypoxic respiratory failure secondary to PNA and pulmonary edema in the setting of missed HD.  She was alert and oriented during at the time of discharge.  ED Course:  Carryover, per Dr. Blaine Hamper:  Overdose, found to have uremia with potassium 5.5, bicarbonate of 15, creatinine 13.9, BUN 79. UDS positive cocaine.  Troponin 0.04 which is chronically elevated.  EKG showed T wave peaking in precordial leads. Hyperkalemia was treated. EDP will consult renal for HD.  Review of Systems:   Unable to perform   PMH, PSH, SH, and FH were reviewed in Epic  Past Medical History:  Diagnosis Date  . Diabetes mellitus without complication ([place])   . ESRD (end stage renal disease) on dialysis ([place])    "TTS; [facility]; they're moving me to another one" (09/22/2018)  . Hepatitis C    diagnosed 09/2018  . Hypertension   . Oxygen deficiency    2 L at night  . Stroke ([place])   . Substance abuse ([place])    has been clean for 4 months     Past Surgical History:  Procedure Laterality Date  . AV FISTULA PLACEMENT Left 03/05/2018   Procedure: ARTERIOVENOUS (AV) FISTULA CREATION  LEFT UPPER EXTREMITY;  Surgeon: [name], MD;  Location: [place];  Service: Vascular;  Laterality: Left;  . AV FISTULA PLACEMENT Left 07/21/2018   Procedure: ARTERIOVENOUS (AV)  FISTULA CREATION;  Surgeon: Marty Heck, MD;  Location: [place];  Service: Vascular;  Laterality: Left;  . AV FISTULA PLACEMENT Left 10/27/2018   Procedure: INSERTION OF ARTERIOVENOUS (AV) GORE-TEX GRAFT UPPER ARM;  Surgeon: Marty Heck, MD;  Location: [place];  Service: Vascular;  Laterality: Left;  . BASCILIC VEIN TRANSPOSITION Left 09/29/2018   Procedure: BASILIC VEIN TRANSPOSITION SECOND STAGE LEFT UPPER ARM;  Surgeon: Marty Heck, MD;  Location: [place];  Service: Vascular;  Laterality: Left;  . HEMATOMA EVACUATION Left 03/06/2018   Procedure: EVACUATION HEMATOMA LEFT ARM;  Surgeon: Angelia Mould, MD;  Location: [place];  Service: Vascular;  Laterality: Left;  . I&D EXTREMITY Left 10/06/2018   Procedure: IRRIGATION AND DEBRIDEMENT ARM;  Surgeon: Marty Heck, MD;  Location: [place];  Service: Vascular;  Laterality: Left;  . INSERTION OF DIALYSIS CATHETER Left 04/29/2018   Procedure: INSERTION OF DIALYSIS CATHETER LEFT INTERNAL JUGULAR;  Surgeon: Angelia Mould, MD;  Location: [place];  Service: Vascular;  Laterality: Left;  . IR FLUORO GUIDE CV LINE RIGHT  02/18/2018  . IR FLUORO GUIDE CV LINE RIGHT  02/26/2018  . IR REMOVAL TUN CV CATH W/O FL  04/27/2018  . IR US GUIDE VASC ACCESS RIGHT  02/18/2018  . TEE WITHOUT CARDIOVERSION N/A 04/29/2018   Procedure: TRANSESOPHAGEAL ECHOCARDIOGRAM (TEE);  Surgeon: Sueanne Margarita, MD;  Location[facility] ENDOSCOPY;  Service: Cardiovascular;  Laterality: N/A;  . VENOGRAM Left 10/27/2018  Procedure: LEFT CENTRAL VENOGRAM;  Surgeon: Marty Heck, MD;  Location: [place];  Service: Vascular;  Laterality: Left;    Social History   Socioeconomic History  . Marital status: Single    Spouse name: Not on file  . Number of children: Not on file  . Years of education: Not on file  . Highest education level: Not on file  Occupational History  . Not on file  Social Needs  . Financial resource strain: Not on file  . Food  insecurity:    Worry: Not on file    Inability: Not on file  . Transportation needs:    Medical: Not on file    Non-medical: Not on file  Tobacco Use  . Smoking status: Current Every Day Smoker    Packs/day: 0.50    Years: 48.00    Pack years: 24.00    Types: Cigarettes  . Smokeless tobacco: Never Used  . Tobacco comment: trying to quit ; smoking 1 1/2 cigarettes/day  (09/22/2018)  Substance and Sexual Activity  . Alcohol use: Not Currently    Comment: 09/22/2018 "stopped about 6 months ago"  . Drug use: Yes    Types: Cocaine, Heroin    Comment: See notes  . Sexual activity: Not Currently    Birth control/protection: None  Lifestyle  . Physical activity:    Days per week: Not on file    Minutes per session: Not on file  . Stress: Not on file  Relationships  . Social connections:    Talks on phone: Not on file    Gets together: Not on file    Attends religious service: Not on file    Active member of club or organization: Not on file    Attends meetings of clubs or organizations: Not on file    Relationship status: Not on file  . Intimate partner violence:    Fear of current or ex partner: Not on file    Emotionally abused: Not on file    Physically abused: Not on file    Forced sexual activity: Not on file  Other Topics Concern  . Not on file  Social History Narrative   Per notes, Pt lives with her son.    Allergies  Allergen Reactions  . Acetaminophen Nausea And Vomiting    Patient states Acetaminophen and Acetaminophen containing products make her nauseated. She demonstrated this 06/21/18 with nausea followed by emesis.  Marland Kitchen Hydroxyzine Itching    Family History  Problem Relation Age of Onset  . Autoimmune disease Neg Hx     Prior to Admission medications   Medication Sig Start Date End Date Taking? Authorizing Provider  Amino Acids-Protein Hydrolys (FEEDING SUPPLEMENT, PRO-STAT SUGAR FREE 64,) LIQD Take 30 mLs by mouth 2 (two) times daily. 11/21/18   Ghimire,  Henreitta Leber, MD  amLODipine (NORVASC) 5 MG tablet Take 1 tablet (5 mg total) by mouth at bedtime. 10/13/18   Alphonzo Grieve, MD  B Complex-C-Zn-Folic Acid (DIALYVITE 361-WERX 15) 0.8 MG TABS Take 1 tablet by mouth daily. 11/04/18   [provider]  Blood Glucose Monitoring Suppl (ACCU-CHEK AVIVA) device Use as instructed 10/29/18 10/29/19  Isabelle Course, MD  calcitRIOL (ROCALTROL) 0.25 MCG capsule Take 1 capsule (0.25 mcg total) by mouth every Monday, Wednesday, and Friday with hemodialysis. 10/29/18   Isabelle Course, MD  glucose blood (ACCU-CHEK AVIVA) test strip Use twice per day. 10/29/18   Isabelle Course, MD  insulin aspart (NOVOLOG) 100 UNIT/ML injection  Inject 5 Units into the skin 3 (three) times daily with meals. Patient taking differently: Inject 3 Units into the skin 3 (three) times daily with meals.  10/13/18   Alphonzo Grieve, MD  insulin glargine (LANTUS) 100 UNIT/ML injection Inject 0.03 mLs (3 Units total) into the skin at bedtime. Patient taking differently: Inject 5 Units into the skin at bedtime.  10/29/18   Isabelle Course, MD  Insulin Syringe-Needle U-100 (INSULIN SYRINGE .3CC/31GX5/16") 31G X 5/16" 0.3 ML MISC Inject 1 each into the skin 4 (four) times daily. 06/27/18   Clent Demark, PA-C  Lancets (ACCU-CHEK SOFT [facility]) lancets Use twice per day. 10/29/18   Isabelle Course, MD  multivitamin (RENA-VIT) TABS tablet Take 1 tablet by mouth at bedtime. 10/13/18   Alphonzo Grieve, MD  pantoprazole (PROTONIX) 40 MG tablet Take 1 tablet (40 mg total) by mouth daily. 11/21/18   Jonetta Osgood, MD    Physical Exam: Vitals:   11/25/18 2637 11/25/18 0732 11/25/18 0733 11/25/18 0739  BP:      Pulse:    (!) 106  Resp: 19 (!) 25  (!) 22  Temp:   (!) 97.1 F (36.2 C)   TempSrc:   Oral   SpO2:         . General: Appears chronically ill, disheveled, and mild agitated . Eyes:  PERRL, EOMI, normal lids, iris . ENT:  grossly normal hearing, lips & tongue, mmm; poor dentition .  Neck:  no LAD, masses or thyromegaly . Cardiovascular:  RR with mild tachycardia, no m/r/g. No LE edema.  Marland Kitchen Respiratory:   CTA bilaterally with no wheezes/rales/rhonchi.  Normal respiratory effort. . Abdomen:  soft, NT, ND, NABS . Back:   normal alignment, no CVAT . Skin:  no rash or induration seen on limited exam . Musculoskeletal:  grossly normal tone BUE/BLE, good ROM, no bony abnormality . Psychiatric: awake but unable to have a meaningful conversation or answer even simple questions; incoherent and nonsensical speech . Neurologic:  Unable to perform    Radiological Exams on Admission: Dg Chest Port 1 View  Result Date: 11/25/2018 CLINICAL DATA:  Overdose EXAM: PORTABLE CHEST 1 VIEW COMPARISON:  Five days ago FINDINGS: Chronic cardiomegaly with size accentuated by technique. Dialysis catheter on the right with tips at the upper cavoatrial junction. Improved aeration since prior. There is interstitial coarsening that is likely at patient's baseline. Mild density at the right base likely reflecting scar based on previous imaging. IMPRESSION: Baseline appearance of the chest. No acute finding when compared to priors. Electronically Signed   By: Monte Fantasia M.D.   On: 11/25/2018 04:57    EKG: Independently reviewed.  Sinus tachycardia with rate 102; LVH, peaked T waves with no evidence of acute ischemia   Labs on Admission: I have personally reviewed the available labs and imaging studies at the time of the admission.  Pertinent labs:   VBG: 7.291/35.6/17.2 APAP, ASA negative UDS + cocaine K+ 5.9 CO2 15 Glucose 118 BUN 79/Creatinine 13.53/GFR 3; 37/7.69/6 on 3/12 Albumin 3.1 Troponin 0.04 Hgb 11.2   Assessment/Plan Principal Problem:   Cocaine overdose, accidental or unintentional, initial encounter ([place]) Active Problems:   TOBACCO ABUSE   Elevated troponin   Hypertension   Type 2 diabetes mellitus (HCC)   Agitation   End-stage renal disease on hemodialysis (HCC)    Hyperkalemia   Cocaine overdose with agitation -Patient presenting with AMS thought to be related to overdose -UDS confirms cocaine -Some of AMS may  also be associated with uremia -Will observe to allow her mental status to clear -SW consult re: substance abuse - will order for tomorrow since the patient clearly would not be able to participate today -She may need more Haldol for agitation  ESRD on HD, with hyperkalemia -Patient on chronic TTS HD -Nephrology prn order set utilized -Due to hyperkalemia and uremia, she does appear to need HD today -Nephrology has been consulted by the ER -She has been given Lokelma  Elevated troponin -Patient with chronically elevated troponin, often higher than present -Given ongoing cocaine use, she is at increased risk of CV events -Will trend troponin and monitor on telemetry  HTN -Continue Norvasc  DM Good control with last A1c -Continue lantus -Cover with sensitive-scale SSI  Tobacco dependence -Will order patch    DVT prophylaxis: Lovenox  Code Status:  Full  Family Communication: None present  Disposition Plan:  Home once clinically improved Consults called: Nephrology; SW  Admission status: It is my clinical opinion that referral for OBSERVATION is reasonable and necessary in this patient based on the above information provided. The aforementioned taken together are felt to place the patient at high risk for further clinical deterioration. However it is anticipated that the patient may be medically stable for discharge from the hospital within 24 to 48 hours.    Karmen Bongo MD [facility]   How to contact the [facility] Attending or Consulting provider [place] or covering provider during after hours [place], for this patient?  1. Check the care team in [facility] and look for a) attending/consulting TRH provider listed and b) the [facility] team listed 2. Log into [email or web address] and use [place]'s universal password to access. If you do  not have the password, please contact the hospital operator. 3. Locate the [facility] provider you are looking for under [facility] and page to a number that you can be directly reached. 4. If you still have difficulty reaching the provider, please page the [facility] (Director on Call) for the Hospitalists listed on amion for assistance.   11/25/2018, 7:59 AM

## 2018-11-25 NOTE — Progress Notes (Signed)
Hypoglycemic Event  CBG: 58  Treatment: Dextrose 50% solution 12.5g  Symptoms: None Follow-up CBG Time: 2252  CBG Result: 193  Possible Reasons for Event: inadequate meal intake.  Comments/MD notified: Per hypoglycemia protocol    Brettany Sydney B Phuong Hillary

## 2018-11-25 NOTE — Progress Notes (Signed)
Patient at this time no longer needs PIV start

## 2018-11-25 NOTE — ED Notes (Signed)
Admitting MD at bedside.

## 2018-11-25 NOTE — Progress Notes (Signed)
Hypoglycemic Event  CBG: 66  Treatment: 1 tube glucose gel  Symptoms: None  Follow-up CBG: Time: 2151 CBG Result: 58  Possible Reasons for Event: Inadequate meal intake  Comments/MD notified: Per hypoglycemia protocol    Rhonda Lynch B Conn Trombetta

## 2018-11-25 NOTE — ED Provider Notes (Signed)
[facility] EMERGENCY DEPARTMENT Provider Note   CSN: [id number] Arrival date & time: 11/25/18  0407    History   Chief Complaint Chief Complaint  Patient presents with   Drug Overdose    Unknown    HPI Rhonda Lynch is a 63 y.o. female.     The history is provided by the EMS personnel. The history is limited by the condition of the patient.  Drug Overdose  This is a new problem. The problem occurs constantly. The problem has not changed since onset.Pertinent negatives include no chest pain, no abdominal pain, no headaches and no shortness of breath. Nothing aggravates the symptoms. She has tried nothing for the symptoms. The treatment provided moderate relief.  Patient with ESRD and a h/o substance abuse who was found in the street and told EMS she must have taken too much insulin.    Past Medical History:  Diagnosis Date   Diabetes mellitus without complication ([place])    ESRD (end stage renal disease) on dialysis ([place])    "TTS; [facility]; they're moving me to another one" (09/22/2018)   Hepatitis C    diagnosed 09/2018   Hypertension    Oxygen deficiency    2 L at night   Stroke [facility])    Substance abuse ([employer])    has been clean for 4 months     Patient Active Problem List   Diagnosis Date Noted   CAP (community acquired pneumonia) 11/17/2018   End-stage renal disease on hemodialysis ([place])    Hypoglycemia 10/23/2018   Non-intractable vomiting    Other ascites    Palliative care encounter    Left arm pain    Chronic hepatitis C virus genotype 1b infection ([place]) 09/28/2018   ICH (intracerebral hemorrhage) ([place]) small left caudate due to HTN 07/17/2018   HCAP (healthcare-associated pneumonia) 06/21/2018   Substance induced mood disorder ([place]) 05/15/2018   Agitation    Hemodialysis catheter infection, initial encounter ([place])    Fungemia 04/27/2018   Pulmonary embolism ([place]) 04/27/2018   Sepsis ([place]) 04/26/2018    Dyspnea 04/26/2018   Chest pain 04/26/2018   Tachycardia 04/26/2018   Abdominal pain 04/18/2018   Type 2 diabetes mellitus ([place]) 04/18/2018   Right flank pain 04/18/2018   Acute respiratory failure with hypoxia ([place]) 04/12/2018   SVT (supraventricular tachycardia) (HCC)    Chronic renal failure    Diabetes ([place]) 04/02/2018   Hypertension    Non-compliance with renal dialysis ([place])    Acute pulmonary edema (HCC)    Elevated troponin 03/19/2018   Fever    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CC)    Diffuse pulmonary alveolar hemorrhage    Hypoxemia    Pulmonary infiltrate    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[place])    ESRD (end stage renal disease) ([place]) 02/12/2018   Acute respiratory failure ([place]) 02/12/2018   Alcohol abuse 02/12/2018   Microcytic anemia 02/12/2018   Cocaine abuse ([place]) 02/12/2018   Epigastric pain 02/12/2018   Pneumonia 02/12/2018   TOBACCO ABUSE 03/08/2010   PNEUMONIA 03/08/2010    Past Surgical History:  Procedure Laterality Date   AV FISTULA PLACEMENT Left 03/05/2018   Procedure: ARTERIOVENOUS (AV) FISTULA CREATION  LEFT UPPER EXTREMITY;  Surgeon: [name], MD;  Location: [place];  Service: Vascular;  Laterality: Left;   AV FISTULA PLACEMENT Left 07/21/2018   Procedure: ARTERIOVENOUS (AV) FISTULA CREATION;  Surgeon: Marty Heck, MD;  Location: [facility];  Service: Vascular;  Laterality: Left;  AV FISTULA PLACEMENT Left 10/27/2018   Procedure: INSERTION OF ARTERIOVENOUS (AV) GORE-TEX GRAFT UPPER ARM;  Surgeon: Marty Heck, MD;  Location: [place];  Service: Vascular;  Laterality: Left;   [place] Left 09/29/2018   Procedure: BASILIC VEIN TRANSPOSITION SECOND STAGE LEFT UPPER ARM;  Surgeon: Marty Heck, MD;  Location: [place];  Service: Vascular;  Laterality: Left;   HEMATOMA EVACUATION Left 03/06/2018   Procedure: EVACUATION HEMATOMA LEFT ARM;  Surgeon: Angelia Mould,  MD;  Location: [place];  Service: Vascular;  Laterality: Left;   I&D EXTREMITY Left 10/06/2018   Procedure: IRRIGATION AND DEBRIDEMENT ARM;  Surgeon: Marty Heck, MD;  Location: [place];  Service: Vascular;  Laterality: Left;   INSERTION OF DIALYSIS CATHETER Left 04/29/2018   Procedure: INSERTION OF DIALYSIS CATHETER LEFT INTERNAL JUGULAR;  Surgeon: Angelia Mould, MD;  Location: [place];  Service: Vascular;  Laterality: Left;   IR FLUORO GUIDE CV LINE RIGHT  02/18/2018   IR FLUORO GUIDE CV LINE RIGHT  02/26/2018   IR REMOVAL TUN CV CATH W/O FL  04/27/2018   IR US GUIDE VASC ACCESS RIGHT  02/18/2018   TEE WITHOUT CARDIOVERSION N/A 04/29/2018   Procedure: TRANSESOPHAGEAL ECHOCARDIOGRAM (TEE);  Surgeon: Sueanne Margarita, MD;  [facility] ENDOSCOPY;  Service: Cardiovascular;  Laterality: N/A;   VENOGRAM Left 10/27/2018   Procedure: LEFT CENTRAL VENOGRAM;  Surgeon: Marty Heck, MD;  Location: [place];  Service: Vascular;  Laterality: Left;     OB History   No obstetric history on file.      Home Medications    Prior to Admission medications   Medication Sig Start Date End Date Taking? Authorizing Provider  Amino Acids-Protein Hydrolys (FEEDING SUPPLEMENT, PRO-STAT SUGAR FREE 64,) LIQD Take 30 mLs by mouth 2 (two) times daily. 11/21/18   Ghimire, Henreitta Leber, MD  amLODipine (NORVASC) 5 MG tablet Take 1 tablet (5 mg total) by mouth at bedtime. 10/13/18   Alphonzo Grieve, MD  B Complex-C-Zn-Folic Acid (DIALYVITE 287-OMVE 15) 0.8 MG TABS Take 1 tablet by mouth daily. 11/04/18   [provider]  Blood Glucose Monitoring Suppl (ACCU-CHEK AVIVA) device Use as instructed 10/29/18 10/29/19  Isabelle Course, MD  calcitRIOL (ROCALTROL) 0.25 MCG capsule Take 1 capsule (0.25 mcg total) by mouth every Monday, Wednesday, and Friday with hemodialysis. 10/29/18   Isabelle Course, MD  glucose blood (ACCU-CHEK AVIVA) test strip Use twice per day. 10/29/18   Isabelle Course, MD  insulin aspart  (NOVOLOG) 100 UNIT/ML injection Inject 5 Units into the skin 3 (three) times daily with meals. Patient taking differently: Inject 3 Units into the skin 3 (three) times daily with meals.  10/13/18   Alphonzo Grieve, MD  insulin glargine (LANTUS) 100 UNIT/ML injection Inject 0.03 mLs (3 Units total) into the skin at bedtime. Patient taking differently: Inject 5 Units into the skin at bedtime.  10/29/18   Isabelle Course, MD  Insulin Syringe-Needle U-100 (INSULIN SYRINGE .3CC/31GX5/16") 31G X 5/16" 0.3 ML MISC Inject 1 each into the skin 4 (four) times daily. 06/27/18   Clent Demark, PA-C  Lancets (ACCU-CHEK SOFT [facility]) lancets Use twice per day. 10/29/18   Isabelle Course, MD  multivitamin (RENA-VIT) TABS tablet Take 1 tablet by mouth at bedtime. 10/13/18   Alphonzo Grieve, MD  pantoprazole (PROTONIX) 40 MG tablet Take 1 tablet (40 mg total) by mouth daily. 11/21/18   Ghimire, Henreitta Leber, MD    Family History  Family History  Problem Relation Age of Onset   Autoimmune disease Neg Hx     Social History Social History   Tobacco Use   Smoking status: Current Every Day Smoker    Packs/day: 0.50    Years: 48.00    Pack years: 24.00    Types: Cigarettes   Smokeless tobacco: Never Used   Tobacco comment: trying to quit ; smoking 1 1/2 cigarettes/day  (09/22/2018)  Substance Use Topics   Alcohol use: Not Currently    Comment: 09/22/2018 "stopped about 6 months ago"   Drug use: Yes    Types: Cocaine, Heroin    Comment: See notes     Allergies   Acetaminophen and Hydroxyzine   Review of Systems Review of Systems  Unable to perform ROS: Acuity of condition  Respiratory: Negative for shortness of breath.   Cardiovascular: Negative for chest pain.  Gastrointestinal: Negative for abdominal pain.  Neurological: Negative for headaches.     Physical Exam Updated Vital Signs BP (!) 149/117    Pulse (!) 101    Resp 15    SpO2 100%   Physical Exam Vitals signs and nursing note  reviewed.  Constitutional:      General: She is not in acute distress.    Comments: Physical ticks lip smacking  HENT:     Head: Normocephalic and atraumatic.     Nose: Nose normal.     Mouth/Throat:     Mouth: Mucous membranes are moist.     Pharynx: Oropharynx is clear.  Eyes:     Conjunctiva/sclera: Conjunctivae normal.     Pupils: Pupils are equal, round, and reactive to light.  Neck:     Musculoskeletal: Normal range of motion and neck supple.  Cardiovascular:     Rate and Rhythm: Normal rate and regular rhythm.     Pulses: Normal pulses.     Heart sounds: Normal heart sounds.  Pulmonary:     Effort: Pulmonary effort is normal.     Breath sounds: Normal breath sounds.  Abdominal:     General: Abdomen is flat. Bowel sounds are normal.     Tenderness: There is no abdominal tenderness.  Musculoskeletal: Normal range of motion.  Skin:    General: Skin is warm and dry.     Capillary Refill: Capillary refill takes less than 2 seconds.  Neurological:     General: No focal deficit present.     Mental Status: She is alert and oriented to person, place, and time.  Psychiatric:        Behavior: Behavior is agitated.      ED Treatments / Results  Labs (all labs ordered are listed, but only abnormal results are displayed) Results for orders placed or performed during the hospital encounter of 11/25/18  Comprehensive metabolic panel  Result Value Ref Range   Sodium 137 135 - 145 mmol/L   Potassium 5.9 (H) 3.5 - 5.1 mmol/L   Chloride 103 98 - 111 mmol/L   CO2 15 (L) 22 - 32 mmol/L   Glucose, Bld 118 (H) 70 - 99 mg/dL   BUN 79 (H) 8 - 23 mg/dL   Creatinine, Ser 13.53 (H) 0.44 - 1.00 mg/dL   Calcium 7.9 (L) 8.9 - 10.3 mg/dL   Total Protein 7.7 6.5 - 8.1 g/dL   Albumin 3.1 (L) 3.5 - 5.0 g/dL   AST 41 15 - 41 U/L   ALT 27 0 - 44 U/L   Alkaline Phosphatase 92 38 - 126  U/L   Total Bilirubin 0.7 0.3 - 1.2 mg/dL   GFR calc non Af Amer 3 (L) >60 mL/min   GFR calc Af Amer 3 (L)  >60 mL/min   Anion gap 19 (H) 5 - 15  Ethanol  Result Value Ref Range   Alcohol, Ethyl (B) <10 <10 mg/dL  cbc  Result Value Ref Range   WBC 11.4 (H) 4.0 - 10.5 K/uL   RBC 4.26 3.87 - 5.11 MIL/uL   Hemoglobin 11.2 (L) 12.0 - 15.0 g/dL   HCT 35.4 (L) 36.0 - 46.0 %   MCV 83.1 80.0 - 100.0 fL   MCH 26.3 26.0 - 34.0 pg   MCHC 31.6 30.0 - 36.0 g/dL   RDW 22.8 (H) 11.5 - 15.5 %   Platelets 218 150 - 400 K/uL   nRBC 0.2 0.0 - 0.2 %  Rapid urine drug screen (hospital performed)  Result Value Ref Range   Opiates NONE DETECTED NONE DETECTED   Cocaine POSITIVE (A) NONE DETECTED   Benzodiazepines NONE DETECTED NONE DETECTED   Amphetamines NONE DETECTED NONE DETECTED   Tetrahydrocannabinol NONE DETECTED NONE DETECTED   Barbiturates NONE DETECTED NONE DETECTED  Salicylate level  Result Value Ref Range   Salicylate Lvl <3.5 2.8 - 30.0 mg/dL  Acetaminophen level  Result Value Ref Range   Acetaminophen (Tylenol), Serum <10 (L) 10 - 30 ug/mL  Troponin I - ONCE - STAT  Result Value Ref Range   Troponin I 0.04 (HH) <0.03 ng/mL  CBG monitoring, ED  Result Value Ref Range   Glucose-Capillary 108 (H) 70 - 99 mg/dL  I-stat Creatinine, ED  Result Value Ref Range   Creatinine, Ser 13.90 (H) 0.44 - 1.00 mg/dL  CBG monitoring, ED  Result Value Ref Range   Glucose-Capillary 91 70 - 99 mg/dL  POCT I-Stat EG7  Result Value Ref Range   pH, Ven 7.291 7.250 - 7.430   pCO2, Ven 35.6 (L) 44.0 - 60.0 mmHg   pO2, Ven 48.0 (H) 32.0 - 45.0 mmHg   Bicarbonate 17.2 (L) 20.0 - 28.0 mmol/L   TCO2 18 (L) 22 - 32 mmol/L   O2 Saturation 79.0 %   Acid-base deficit 9.0 (H) 0.0 - 2.0 mmol/L   Sodium 136 135 - 145 mmol/L   Potassium 5.5 (H) 3.5 - 5.1 mmol/L   Calcium, Ion 0.92 (L) 1.15 - 1.40 mmol/L   HCT 36.0 36.0 - 46.0 %   Hemoglobin 12.2 12.0 - 15.0 g/dL   Patient temperature 37.0 C    Sample type VENOUS    Dg Chest 2 View  Result Date: 11/20/2018 CLINICAL DATA:  Acute respiratory failure. EXAM: CHEST  - 2 VIEW COMPARISON:  11/18/2018 FINDINGS: Cardiomegaly and bilateral interstitial opacities are again noted. A RIGHT IJ central venous catheter is noted with tip overlying the LOWER SVC. No pleural effusion or pneumothorax. No acute bony abnormalities. IMPRESSION: Unchanged appearance of the chest with cardiomegaly and bilateral interstitial opacities. Electronically Signed   By: Margarette Canada M.D.   On: 11/20/2018 14:28   Ct Chest W Contrast  Result Date: 11/19/2018 CLINICAL DATA:  Hemoptysis x 2-3 days EXAM: CT CHEST WITH CONTRAST TECHNIQUE: Multidetector CT imaging of the chest was performed during intravenous contrast administration. CONTRAST:  15mL OMNIPAQUE IOHEXOL 300 MG/ML  SOLN COMPARISON:  Chest radiographs dated 11/18/2018 FINDINGS: Cardiovascular: Heart is normal in size.  No pericardial effusion. No evidence thoracic aortic aneurysm. Mild atherosclerotic calcifications of the aortic arch. Right dual lumen dialysis catheter terminating  at the cavoatrial junction. Mediastinum/Nodes: Small mediastinal lymph nodes which do not meet pathologic CT size criteria. Visualized thyroid is unremarkable. Lungs/Pleura: Extensive central ground-glass with coarse interstitial markings but no true interlobular septal thickening or pleural effusions. Relative subpleural sparing. No craniocaudal gradient. This appearance may reflect pulmonary edema, although given lack of associated findings, atypical pneumonia such as mycoplasma or viral pneumonia is also possible. Of note, this does not have the characteristic peripheral distribution to suggest COVID-19. Mild subpleural patchy opacity in the lateral right lung base (series 5/image 83). No pneumothorax. Upper Abdomen: Visualized upper abdomen is grossly unremarkable. Musculoskeletal: Mild degenerative changes of the visualized thoracolumbar spine. IMPRESSION: Extensive ground-glass opacity in the lungs bilaterally, possibly reflecting pulmonary edema, although  atypical/viral pneumonia is also possible given the lack of associated findings. Aortic Atherosclerosis (ICD10-I70.0). Electronically Signed   By: Julian Hy M.D.   On: 11/19/2018 14:11   Dg Chest Port 1 View  Result Date: 11/25/2018 CLINICAL DATA:  Overdose EXAM: PORTABLE CHEST 1 VIEW COMPARISON:  Five days ago FINDINGS: Chronic cardiomegaly with size accentuated by technique. Dialysis catheter on the right with tips at the upper cavoatrial junction. Improved aeration since prior. There is interstitial coarsening that is likely at patient's baseline. Mild density at the right base likely reflecting scar based on previous imaging. IMPRESSION: Baseline appearance of the chest. No acute finding when compared to priors. Electronically Signed   By: Monte Fantasia M.D.   On: 11/25/2018 04:57   Dg Chest Port 1 View  Result Date: 11/16/2018 CLINICAL DATA:  Nausea and vomiting with cough EXAM: PORTABLE CHEST 1 VIEW COMPARISON:  10/24/2018 FINDINGS: Cardiac shadow is enlarged. Aortic calcifications are again seen. Dialysis catheter is again noted on the right. Diffuse alveolar opacities are identified consistent with acute pulmonary edema no focal effusion is seen. Slight increased density is noted in the right base which may represent a superimposed infiltrate. IMPRESSION: Changes consistent with pulmonary edema bilaterally with likely superimposed infiltrate in the right base. Electronically Signed   By: Inez Catalina M.D.   On: 11/16/2018 05:01   Dg Chest Port 1v Same Day  Result Date: 11/18/2018 CLINICAL DATA:  Shortness of breath. EXAM: PORTABLE CHEST 1 VIEW COMPARISON:  Radiographs 11/2018 and 10/24/2018. Abdominal CT 10/23/2018. FINDINGS: 1403 hours. The right IJ hemodialysis catheter is unchanged, tip at the superior cavoatrial junction. There is stable cardiomegaly and aortic atherosclerosis. The bilateral airspace opacities have improved, most consistent with resolving edema. Residual asymmetric  opacity at the right costophrenic angle appears chronic, likely scarring. No significant pleural effusion. IMPRESSION: Improving pulmonary edema. Electronically Signed   By: Richardean Sale M.D.   On: 11/18/2018 14:35    EKG EKG Interpretation  Date/Time:  Tuesday November 25 2018 04:31:43 EDT Ventricular Rate:  102 PR Interval:    QRS Duration: 87 QT Interval:  379 QTC Calculation: 494 R Axis:   -32 Text Interpretation:  Sinus tachycardia Probable left ventricular hypertrophy peaked T waves Confirmed by Randal Buba, Veona Bittman ([id number]) on 11/25/2018 5:33:59 AM   Radiology Dg Chest Port 1 View  Result Date: 11/25/2018 CLINICAL DATA:  Overdose EXAM: PORTABLE CHEST 1 VIEW COMPARISON:  Five days ago FINDINGS: Chronic cardiomegaly with size accentuated by technique. Dialysis catheter on the right with tips at the upper cavoatrial junction. Improved aeration since prior. There is interstitial coarsening that is likely at patient's baseline. Mild density at the right base likely reflecting scar based on previous imaging. IMPRESSION: Baseline appearance of the chest. No acute finding  when compared to priors. Electronically Signed   By: Monte Fantasia M.D.   On: 11/25/2018 04:57    Procedures Procedures (including critical care time)  Medications Ordered in ED Medications  sodium zirconium cyclosilicate (LOKELMA) packet 10 g (has no administration in time range)  albuterol (PROVENTIL) (2.5 MG/3ML) 0.083% nebulizer solution 10 mg (10 mg Nebulization Given 11/25/18 0544)  insulin aspart (novoLOG) injection 5 Units (5 Units Intravenous Given 11/25/18 0545)    And  dextrose 50 % solution 50 mL (50 mLs Intravenous Given 11/25/18 0544)  sodium bicarbonate injection 50 mEq (50 mEq Intravenous Given 11/25/18 0544)  haloperidol lactate (HALDOL) injection 2 mg (2 mg Intramuscular Given 11/25/18 0601)   MDM Reviewed: previous chart, nursing note and vitals Interpretation: labs and ECG (hyperkalemia, CXR no PNA by  me) Consults: admitting MD (renal Dr. Posey Pronto  )  Patient ripped out her IV and required Haldol to reinsert and give medication  CRITICAL CARE Performed by: Kazi Montoro K Karesa Maultsby-Rasch Total critical care time: 60 minutes Critical care time was exclusive of separately billable procedures and treating other patients. Critical care was necessary to treat or prevent imminent or life-threatening deterioration. Critical care was time spent personally by me on the following activities: development of treatment plan with patient and/or surrogate as well as nursing, discussions with consultants, evaluation of patient's response to treatment, examination of patient, obtaining history from patient or surrogate, ordering and performing treatments and interventions, ordering and review of laboratory studies, ordering and review of radiographic studies, pulse oximetry and re-evaluation of patient's condition.  Case d/w Dr. Posey Pronto who will see the patient   Final Clinical Impressions(s) / ED Diagnoses   Final diagnoses:  Cocaine abuse ([place])  Altered mental status, unspecified altered mental status type  Hyperkalemia    Admit to medicine    Shantai Tiedeman, MD 11/25/18 [phone]

## 2018-11-25 NOTE — Progress Notes (Addendum)
[facility] Progress Note   Interval History:  63 year old female with ESRD on HD TTS. Recent [facility] admission 3/8 -3/13 with acute hypoxic respiratory failure 2/2 pneumonia/pulmonary edema. Now admitted under observation status with AMS 2/2 cocaine use.  CXR stable. Labs: K 5.9, CO2 15, BUN 79 Cr 13.53, Glucose 118, Ca 7.9, Hgb 12.2   Due for HD today. Last HD was 3/12 in hospital. Did not go to outpatient dialysis on 3/14.   Seen and examined in room. Unable to get much history, but admits cocaine use. Incoherent, unable to keep still in bed. No specific complaints.    Objective Vitals:   11/25/18 0732 11/25/18 0733 11/25/18 0739 11/25/18 0822  BP:    137/85  Pulse:   (!) 106 98  Resp: (!) 25  (!) 22 18  Temp:  (!) 97.1 F (36.2 C)  98 F (36.7 C)  TempSrc:  Oral  Oral  SpO2:    99%    Physical Exam General: Ill appearing female, agitated,  Heart: Tachy Lungs: Loud upper airway noise, grossly CTA Abdomen: soft NTND Extremities: No LE edema  Dialysis Access: R chest TDC : New LUE AVG +bruit   Weight change:    Additional Objective Labs: Basic Metabolic Panel: Recent Labs  Lab 11/20/18 0911 11/25/18 0424 11/25/18 0451 11/25/18 0452  NA 132* 137 136  --   K 3.6 5.9* 5.5*  --   CL 98 103  --   --   CO2 21* 15*  --   --   GLUCOSE 86 118*  --   --   BUN 37* 79*  --   --   CREATININE 7.69* 13.53*  --  13.90*  CALCIUM 8.5* 7.9*  --   --   PHOS 5.0*  --   --   --    CBC: Recent Labs  Lab 11/20/18 0716 11/25/18 0424 11/25/18 0451  WBC 10.9* 11.4*  --   HGB 10.7* 11.2* 12.2  HCT 34.9* 35.4* 36.0  MCV 85.1 83.1  --   PLT 236 218  --    Blood Culture    Component Value Date/Time   SDES BLOOD RIGHT FOREARM 11/16/2018 1238   SPECREQUEST  11/16/2018 1238    BOTTLES DRAWN AEROBIC ONLY Blood Culture adequate volume   CULT  11/16/2018 1238    NO GROWTH 5 DAYS Performed at [facility], [place] [address], [place], [place] [address]    REPTSTATUS 11/21/2018 FINAL 11/16/2018 1238     Medications:  . amLODipine  5 mg Oral QHS  . Chlorhexidine Gluconate Cloth  6 each Topical Q0600  . dextrose      . Dialyvite 800-Zinc 15  1 tablet Oral Daily  . doxercalciferol  4 mcg Intravenous Q T,Th,Sa-HD  . enoxaparin (LOVENOX) injection  30 mg Subcutaneous Q24H  . feeding supplement (PRO-STAT SUGAR FREE 64)  30 mL Oral BID  . insulin aspart  0-9 Units Subcutaneous TID WC  . insulin glargine  5 Units Subcutaneous QHS  . pantoprazole  40 mg Oral Daily  . sodium chloride flush  3 mL Intravenous Q12H  . sodium zirconium cyclosilicate  10 g Oral STAT    [facility] TTS   4h  400/800  58.5kg  2/2.5 bath  LUE AVG (placed 2/18) / TDC  Heparin 3000 Mircera: 172mcg IV q 2 weeks  Venofer 100mg  IV q HD x 5 doses  Hectorol 4 mcg IV q HD  Assessment/Plan: 1. AMS/substance abuse -  UDS +cocaine. Per primary States she had been clean since last summer until this episode.  2. ESRD - HD TTS. Continue on schedule today.  3. HTN/volume- BP elevated. On amlodipine 5.  UF 3L with HD today today  4. Anemia- Hgb 12.2. No ESA needs. Got Aranesp 150 on 3/10 during last admit.  5.Metabolic Bone Disease- Ca ok  No binders yet. Follow trend. Continue Hectorol added Ca bath.  6. Nutrition - Renal diet/fluid restrictions. Prot supp for low albumin  7. Hx BOOP and ANCA related vasculitis  7. DM - insulin per primary   Lynnda Child PA-C [place] Pager [phone] 11/25/2018,11:44 AM  LOS: 0 days   Pt seen, examined and agree w assess/plan as above with additions as indicated.  [facility] 11/25/2018, 1:07 PM

## 2018-11-25 NOTE — Progress Notes (Signed)
Hypoglycemic Event  CBG: 58  Treatment: D50 25 mL (12.5 gm)  Symptoms: None  Follow-up CBG: Time:1838 CBG Result:94  Possible Reasons for Event: Inadequate meal intake     Rhonda Lynch

## 2018-11-25 NOTE — Care Management (Signed)
This is a no charge note  Pending admission per Dr. Randal Buba,  63 year old lady with past medical history for ESRD-HD (TTS), HCV, polysubstance abuse (cocaine, heroin, tobacco), hypertension, diabetes mellitus, who presents with drug overdose (cocaine), found to have uremia with potassium 5.5, bicarbonate of 15, creatinine 13.9, BUN 79. UDS positive cocaine.  Troponin 0.04 which is chronically elevated.  EKG showed T wave peaking in precordial leads. Hyperkalemia was treated. EDP will consult renal for HD.  Patient is placed on tele bed for obs.   Ivor Costa, MD  [facility]   If 7PM-7AM, please contact night-coverage [email or web address] Password [email or web address] 11/25/2018, 6:50 AM

## 2018-11-25 NOTE — Progress Notes (Signed)
Inpatient Diabetes Program Recommendations  AACE/ADA: New Consensus Statement on Inpatient Glycemic Control   Target Ranges:  Prepandial:   less than 140 mg/dL      Peak postprandial:   less than 180 mg/dL (1-2 hours)      Critically ill patients:  140 - 180 mg/dL   Results for Rhonda Lynch, Rhonda Lynch (MRN [id number]) as of 11/25/2018 11:46  Ref. Range 11/25/2018 04:10 11/25/2018 05:05 11/25/2018 07:21 11/25/2018 08:26 11/25/2018 08:55 11/25/2018 11:24  Glucose-Capillary Latest Ref Range: 70 - 99 mg/dL 108 (H) 91   Novolog 5 units  D50 50 mls 56 (L)     D50 25 mls 99 79   Review of Glycemic Control  Diabetes history: DM2 Outpatient Diabetes medications: Lantus 5 units QHS, Novolog 3 units TID with meals Current orders for Inpatient glycemic control: Lantus 5 units QHS, Novolog 0-9 units TID with meals  Inpatient Diabetes Program Recommendations:  Insulin - Basal: May want to consider discontinuing Lantus for now and following glycemic trends to determine if basal insulin is needed or not.  Correction (SSI): Please consider ordering Novolog 0-5 units QHS.  NOTE: Noted patient received Novolog and D50 for hyperkalemia treatment this morning and then experienced glucose of 56 mg/dl at 8:26 am today. Patient was recently inpatient and was only ordered Novolog correction with fairly good glycemic control. May want to consider discontinuing Lantus for now and just use Novolog correction when needed.  Thanks, Barnie Alderman, RN, MSN, CDE Diabetes Coordinator Inpatient Diabetes Program [phone] (Team Pager from 8am to 5pm)

## 2018-11-25 NOTE — ED Triage Notes (Signed)
Pt found in middle of street; pt states that she took too much insulin; pt states that she has pacemaker; A/Ox4 per EMS; pt irritated; pt dialysis pt  T/TH/Sat; states that she was recently hospitalized for low Hg; 96% on RA; 160/101; 110; 18

## 2018-11-26 DIAGNOSIS — T405X1A Poisoning by cocaine, accidental (unintentional), initial encounter: Secondary | ICD-10-CM | POA: Diagnosis not present

## 2018-11-26 LAB — BASIC METABOLIC PANEL
Anion gap: 12 (ref 5–15)
BUN: 13 mg/dL (ref 8–23)
CALCIUM: 8.6 mg/dL — AB (ref 8.9–10.3)
CO2: 28 mmol/L (ref 22–32)
CREATININE: 5.67 mg/dL — AB (ref 0.44–1.00)
Chloride: 98 mmol/L (ref 98–111)
GFR calc Af Amer: 8 mL/min — ABNORMAL LOW (ref >60)
GFR calc non Af Amer: 7 mL/min — ABNORMAL LOW (ref >60)
Glucose, Bld: 151 mg/dL — ABNORMAL HIGH (ref 70–99)
Potassium: 3.7 mmol/L (ref 3.5–5.1)
Sodium: 138 mmol/L (ref 135–145)

## 2018-11-26 LAB — CBC
HCT: 38.4 % (ref 36.0–46.0)
HEMOGLOBIN: 11.7 g/dL — AB (ref 12.0–15.0)
MCH: 24.7 pg — ABNORMAL LOW (ref 26.0–34.0)
MCHC: 30.5 g/dL (ref 30.0–36.0)
MCV: 81.2 fL (ref 80.0–100.0)
Platelets: 199 10*3/uL (ref 150–400)
RBC: 4.73 MIL/uL (ref 3.87–5.11)
RDW: 21.8 % — ABNORMAL HIGH (ref 11.5–15.5)
WBC: 6.8 10*3/uL (ref 4.0–10.5)
nRBC: 0 % (ref 0.0–0.2)

## 2018-11-26 LAB — GLUCOSE, CAPILLARY
Glucose-Capillary: 165 mg/dL — ABNORMAL HIGH (ref 70–99)
Glucose-Capillary: 125 mg/dL — ABNORMAL HIGH (ref 70–99)

## 2018-11-26 LAB — TROPONIN I: Troponin I: 0.04 ng/mL (ref <0.03)

## 2018-11-26 MED ORDER — SEVELAMER CARBONATE 800 MG PO TABS: 1600.0000 mg | ORAL_TABLET | Freq: Three times a day (TID) | ORAL | 0 refills | Status: DC

## 2018-11-26 MED ORDER — OXYCODONE HCL 5 MG PO TABS
5.0000 mg | ORAL_TABLET | Freq: Once | ORAL | Status: AC
  Administered 2018-11-26: 5 mg via ORAL
  Filled 2018-11-26: qty 1

## 2018-11-26 MED ORDER — SEVELAMER CARBONATE 800 MG PO TABS
1600.0000 mg | ORAL_TABLET | Freq: Three times a day (TID) | ORAL | Status: DC
  Administered 2018-11-26: 1600 mg via ORAL
  Filled 2018-11-26: qty 2

## 2018-11-26 NOTE — TOC Initial Note (Signed)
Transition of Care [facility]) - Initial/Assessment Note    Patient Details  Name: Rhonda Lynch MRN: [id number] Date of Birth: 05/07/1956  [facility]) CM/SW Contact:    Bartholomew Crews, RN Phone Number: 11/26/2018, 12:58 PM  Clinical Narrative:                 Spoke with patient at bedside. PTA home with roommate. Goes to [facility] 12pm for dialysis. [employer] provides transportation. States her roommate will provide transportation home. No HH/DME needs identified. Patient reports not needing any assistance. Bedside RN advised that patient declined PT eval[facility] issues per patient. Anticipate transition home today or tomorrow. No transition of care needs identified at this time.   Expected Discharge Plan: Home/Self Care Barriers to Discharge: No Barriers Identified   Patient Goals and CMS Choice Patient states their goals for this hospitalization and ongoing recovery are:: "to get out of here"   Choice offered to / list presented to : NA  Expected Discharge Plan and Services Expected Discharge Plan: Home/Self Care Discharge Planning Services: CM Consult Post Acute Care Choice: NA Living arrangements for the past 2 months: Single Family Home                 DME Arranged: N/A DME Agency: NA HH Arranged: NA HH Agency: NA  Prior Living Arrangements/Services Living arrangements for the past 2 months: Single Family Home Lives with:: Roommate, Self Patient language and need for interpreter reviewed:: No Do you feel safe going back to the place where you live?: Yes      Need for Family Participation in Patient Care: No (Comment)     Criminal Activity/Legal Involvement Pertinent to Current Situation/Hospitalization: No - Comment as needed  Activities of Daily Living Home Assistive Devices/Equipment: CBG Meter ADL Screening (condition at time of admission) Patient's cognitive ability adequate to safely complete daily activities?: Yes Is the  patient deaf or have difficulty hearing?: No Does the patient have difficulty seeing, even when wearing glasses/contacts?: No Does the patient have difficulty concentrating, remembering, or making decisions?: No Patient able to express need for assistance with ADLs?: Yes Does the patient have difficulty dressing or bathing?: No Independently performs ADLs?: No Does the patient have difficulty walking or climbing stairs?: Yes Weakness of Legs: Both Weakness of Arms/Hands: Both  Permission Sought/Granted                  Emotional Assessment Appearance:: Appears stated age Attitude/Demeanor/Rapport: Engaged Affect (typically observed): Accepting, Calm Orientation: : Oriented to Self, Oriented to Place, Oriented to Situation, Oriented to  Time Alcohol / Substance Use: Illicit Drugs Psych Involvement: No (comment)  Admission diagnosis:  Hyperkalemia [E87.5] Cocaine abuse (HCC) [F14.10] Altered mental status, unspecified altered mental status type [R41.82] Patient Active Problem List   Diagnosis Date Noted  . Hyperkalemia 11/25/2018  . Cocaine overdose, accidental or unintentional, initial encounter ([place]) 11/25/2018  . CAP (community acquired pneumonia) 11/17/2018  . End-stage renal disease on hemodialysis ([place])   . Hypoglycemia 10/23/2018  . Non-intractable vomiting   . Other ascites   . Palliative care encounter   . Left arm pain   . Chronic hepatitis C virus genotype 1b infection ([place]) 09/28/2018  . ICH (intracerebral hemorrhage) ([place]) small left caudate due to HTN 07/17/2018  . HCAP (healthcare-associated pneumonia) 06/21/2018  . Substance induced mood disorder ([place]) 05/15/2018  . Agitation   . Hemodialysis catheter infection, initial encounter ([place])   .  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 Pulmonary embolism ([place]) 04/27/2018  . Sepsis ([place]) 04/26/2018  . Dyspnea 04/26/2018  . Chest pain 04/26/2018  . Tachycardia 04/26/2018  . Abdominal pain 04/18/2018  . Type 2 diabetes  mellitus ([place]) 04/18/2018  . Right flank pain 04/18/2018  . Acute respiratory failure with hypoxia ([place]) 04/12/2018  . SVT (supraventricular tachycardia) ([place])   . Chronic renal failure   . Hypertension   . Non-compliance with renal dialysis ([place])   . Acute pulmonary edema (HCC)   . Elevated troponin 03/19/2018  . Fever   . 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 Diffuse pulmonary alveolar hemorrhage   . Hypoxemia   . Pulmonary infiltrate   . 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[place])   . Acute respiratory failure ([place]) 02/12/2018  . Alcohol abuse 02/12/2018  . Microcytic anemia 02/12/2018  . Cocaine abuse ([place]) 02/12/2018  . Epigastric pain 02/12/2018  . Pneumonia 02/12/2018  . TOBACCO ABUSE 03/08/2010  . PNEUMONIA 03/08/2010   PCP:  Clent Demark, PA-C Pharmacy:   [facility], [place] - [place] [address] [place] [address] Phone: [phone] Fax: [phone]  [id number] [place], [address] [place] [address] Phone: [phone] Fax: [place], [address] [place] [address] Phone: [phone] Fax: [phone]  [id number] [place], [place] [address] [place] [address] Phone: [phone] Fax: [phone]     Social Determinants of Health (SDOH) Interventions    Readmission Risk Interventions 30 Day Unplanned Readmission Risk Score     ED to Hosp-Admission (Discharged) from 11/16/2018 in [place]  30 Day Unplanned Readmission Risk Score (%)  63 Filed at 11/21/2018 1200     This score is the patient's risk of an unplanned readmission within 30 days of being discharged (0 -100%). The score is based on dignosis, age, lab data, medications, orders, and past utilization.   Low:  0-14.9   Medium: 15-21.9   High: 22-29.9    Extreme: 30 and above       No flowsheet data found.

## 2018-11-26 NOTE — Progress Notes (Addendum)
[facility] Progress Note   Subjective:  Seen in room. More alert today, doesn't remember yesterday's events. Denies CP, SOB.    Objective Vitals:   11/25/18 1700 11/25/18 2038 11/26/18 0442 11/26/18 0858  BP: 105/85 132/77 (!) 147/67 (!) 144/80  Pulse: 98 96 86 90  Resp:  18 18 18   Temp: 99.5 F (37.5 C) 99 F (37.2 C) 98.2 F (36.8 C) 98.5 F (36.9 C)  TempSrc: Oral Oral Oral Oral  SpO2:  100% 97% 100%  Weight:  55.7 kg      Physical Exam General: Ill appearing female, calm NAD  Heart: RRR Lungs: Restricted air movement; No rales, wheeze  Abdomen: soft NTND Extremities: No LE edema  Dialysis Access: R chest TDC : New LUE AVG +bruit   Weight change:    Additional Objective Labs: Basic Metabolic Panel: Recent Labs  Lab 11/20/18 0911 11/25/18 0424 11/25/18 0451 11/25/18 0452 11/25/18 1211 11/26/18 0559  NA 132* 137 136  --  138 138  K 3.6 5.9* 5.5*  --  5.0 3.7  CL 98 103  --   --  103 98  CO2 21* 15*  --   --  16* 28  GLUCOSE 86 118*  --   --  73 151*  BUN 37* 79*  --   --  81* 13  CREATININE 7.69* 13.53*  --  13.90* 13.74* 5.67*  CALCIUM 8.5* 7.9*  --   --  7.7* 8.6*  PHOS 5.0*  --   --   --  11.4*  --    CBC: Recent Labs  Lab 11/20/18 0716 11/25/18 0424 11/25/18 0451 11/25/18 1211 11/26/18 0559  WBC 10.9* 11.4*  --  11.2* 6.8  HGB 10.7* 11.2* 12.2 10.1* 11.7*  HCT 34.9* 35.4* 36.0 31.5* 38.4  MCV 85.1 83.1  --  81.6 81.2  PLT 236 218  --  205 199   Blood Culture    Component Value Date/Time   SDES BLOOD RIGHT FOREARM 11/16/2018 1238   SPECREQUEST  11/16/2018 1238    BOTTLES DRAWN AEROBIC ONLY Blood Culture adequate volume   CULT  11/16/2018 1238    NO GROWTH 5 DAYS Performed at [facility], [place] [address], [place], [place] [address]    REPTSTATUS 11/21/2018 FINAL 11/16/2018 1238     Medications:  . amLODipine  5 mg Oral QHS  . b complex-vitamin c-folic acid  1 tablet Oral QHS  . Chlorhexidine Gluconate  Cloth  6 each Topical Q0600  . doxercalciferol  4 mcg Intravenous Q T,Th,Sa-HD  . enoxaparin (LOVENOX) injection  30 mg Subcutaneous Q24H  . feeding supplement (PRO-STAT SUGAR FREE 64)  30 mL Oral BID  . insulin aspart  0-9 Units Subcutaneous TID WC  . pantoprazole  40 mg Oral Daily  . sodium chloride flush  3 mL Intravenous Q12H    [facility] TTS   4h  400/800  58.5kg  2/2.5 bath  LUE AVG (placed 2/18) / TDC  Heparin 3000 Mircera: 192mcg IV q 2 weeks  Venofer 100mg  IV q HD x 5 doses  Hectorol 4 mcg IV q HD  Assessment/Plan: 1. AMS/substance abuse - UDS +cocaine. Per primary States she had been clean since last summer until this episode.  2. ESRD - HD TTS. Next HD 3/19. Plan to start using AVG next HD.  3. HTN/volume- BP better today. On amlodipine 5.  HD 3/17 Net UF 1L Post wt 55.7kg  4. Anemia- Hgb 11.7 No ESA  needs. Got Aranesp 150 on 3/10 during last admit.  5.Metabolic Bone Disease- Ca ok  Phos 11.4 -Will start Renvela 1600 tid. Continue Hectorol added Ca bath.  6. Nutrition - Renal diet/fluid restrictions. Prot supp for low albumin  7. Hx BOOP and ANCA related vasculitis  7. DM - insulin per primary   Lynnda Child PA-C [place] Pager [phone] 11/26/2018,9:33 AM  LOS: 0 days   Pt seen, examined and agree w A/P as above.  [facility] 11/26/2018, 12:06 PM

## 2018-11-26 NOTE — Progress Notes (Signed)
Physical Therapy Evaluation Patient Details Name: Rhonda Lynch MRN: [id number] DOB: Aug 11, 1956 Today's Date: 11/26/2018   History of Present Illness  Patient is 63 y/o female presenting to hospital secondary to cocaine overdose. Patient recently d/c from hospital on 3/13 secondary to pnuemonia. PMH includes tobacco abuse, HTN, ESRD on HD TTS, anemia, DM, and metabolic bone disease.   Clinical Impression  Patient admitted to hospital secondary to problems above and with deficits below. Patient at [place] level for all bed mobility. Patient refusing to ambulate until she gets pain medicine for headache. RN notified and in room at end of session. Will update recommendation pending functional mobility progression. Patient will benefit from acute physical therapy to maximize independence and safety with functional mobility.     Follow Up Recommendations Other (comment)(No PT follow up vs HHPT pending mobility progression)    Equipment Recommendations  None recommended by PT    Recommendations for Other Services       Precautions / Restrictions Precautions Precautions: None Restrictions Weight Bearing Restrictions: No      Mobility  Bed Mobility Overal bed mobility: Modified Independent             General bed mobility comments: Patient at modI level for bed mobility. Patient performed bed mobility, however, then refusing to ambulate until she got medicine for her headache. RN notified and in room at end of session. Pt continued to refuse, after RN told pt she could not have pain meds.   Transfers                    Ambulation/Gait                Stairs            Wheelchair Mobility    Modified Rankin (Stroke Patients Only)       Balance                                             Pertinent Vitals/Pain Pain Assessment: 0-10 Pain Score: 9  Pain Location: headache Pain Descriptors / Indicators: Aching Pain Intervention(s):  Limited activity within patient's tolerance;Monitored during session;Patient requesting pain meds-RN notified    Home Living Family/patient expects to be discharged to:: Private residence Living Arrangements: Non-relatives/Friends Available Help at Discharge: Family;Available 24 hours/day Type of Home: House Home Access: Stairs to enter Entrance Stairs-Rails: Right;Left;Can reach both Entrance Stairs-Number of Steps: 2 Home Layout: One level Home Equipment: Walker - 4 wheels      Prior Function Level of [place]: Independent               Hand Dominance        Extremity/Trunk Assessment   Upper Extremity Assessment Upper Extremity Assessment: Overall WFL for tasks assessed    Lower Extremity Assessment Lower Extremity Assessment: Overall WFL for tasks assessed    Cervical / Trunk Assessment Cervical / Trunk Assessment: Normal  Communication   Communication: No difficulties  Cognition Arousal/Alertness: Awake/alert Behavior During Therapy: Agitated Overall Cognitive Status: Within Functional Limits for tasks assessed                                 General Comments: Patient agitated and requesting pain meds. RN notified and in room at end of session stating pain meds are  not being delievered secondary to hx of abuse.       General Comments General comments (skin integrity, edema, etc.): Patient states "I can walk fine"    Exercises     Assessment/Plan    PT Assessment Patient needs continued PT services  PT Problem List Decreased balance;Decreased mobility;Pain;Decreased activity tolerance       PT Treatment Interventions DME instruction;Gait training;Stair training;Functional mobility training;Therapeutic activities;Therapeutic exercise;Balance training;Patient/family education    PT Goals (Current goals can be found in the Care Plan section)  Acute Rehab PT Goals Patient Stated Goal: "I need pain meds" PT Goal Formulation: With  patient Time For Goal Achievement: 12/10/18 Potential to Achieve Goals: Good    Frequency Min 3X/week   Barriers to discharge        Co-evaluation               AM-PAC PT "6 Clicks" Mobility  Outcome Measure Help needed turning from your back to your side while in a flat bed without using bedrails?: None Help needed moving from lying on your back to sitting on the side of a flat bed without using bedrails?: None Help needed moving to and from a bed to a chair (including a wheelchair)?: None Help needed standing up from a chair using your arms (e.g., wheelchair or bedside chair)?: None Help needed to walk in hospital room?: None Help needed climbing 3-5 steps with a railing? : A Little 6 Click Score: 23    End of Session   Activity Tolerance: Treatment limited secondary to agitation Patient left: in bed;with call bell/phone within reach;with nursing/sitter in room(sitting EOB) Nurse Communication: Mobility status;Patient requests pain meds PT Visit Diagnosis: Other abnormalities of gait and mobility (R26.89);Pain Pain - part of body: (head)    Time: 4128-7867 PT Time Calculation (min) (ACUTE ONLY): 16 min   Charges:   PT Evaluation $PT Eval Moderate Complexity: 1 Mod          Erick Blinks, SPT  Erick Blinks 11/26/2018, 2:58 PM

## 2018-11-26 NOTE — Care Management Obs Status (Signed)
[facility] NOTIFICATION   Patient Details  Name: ULANI DEGRASSE MRN: [id number] Date of Birth: 05-15-1956   Medicare Observation Status Notification Given:  Yes    Bartholomew Crews, RN 11/26/2018, 12:19 PM

## 2018-11-26 NOTE — Discharge Summary (Signed)
Physician Discharge Summary  [name] MRN: [id number] DOB/AGE: 1956-09-10 63 y.o.  Admit date: 11/25/2018 Discharge date: 11/26/2018  Admission Diagnoses:  Discharge Diagnoses:  Principal Problem:   Cocaine overdose, accidental or unintentional, initial encounter [facility]) Active Problems:   TOBACCO ABUSE   Elevated troponin   Hypertension   Type 2 diabetes mellitus ([place])   Agitation   End-stage renal disease on hemodialysis ([place])   Hyperkalemia   Discharged Condition: stable  Hospital Course: Patient is a 63 year old African-American female, with past medical history significant for end-stage renal disease on hemodialysis on Tuesday, Thursday and Saturday; hep C, polysubstance abuse (cocaine heroine and tobacco), hypertension and diabetes mellitus.  Patient was admitted following overuse/likely unintentional cocaine overdose.  On presentation, urine toxicology was positive for cocaine and potassium was 5.5 with troponin 0 0.04.  Troponin level has remained flat.  Patient was admitted overnight for observation.  Patient was dialyzed on presentation, considering elevated potassium level as well.  Patient has improved significantly.  Patient is back to her normal self.  Patient is eager to be discharged back home.  Patient has been counseled to quit illicit drug use.  Patient will follow-up with her primary care provider and nephrologist on discharge.  Potassium level prior to discharge was 3.7.  Significantly, phosphorus level was 11.4.  Will defer further management to the patient's nephrologist and primary care provider.  Consults: None  Significant Diagnostic Studies:  Phosphorus is 11.4.  Potassium on presentation was 5.9. Troponin remained flat at 0.04. Urine toxicology was positive for cocaine.  Discharge Exam: Blood pressure (!) 144/80, pulse 90, temperature 98.5 F (36.9 C), temperature source Oral, resp. rate 18, weight 55.7 kg, SpO2 100 %.  Disposition:  Discharge disposition: 01-Home or Self Care Is  Discharge Instructions    Diet - low sodium heart healthy   Complete by:  As directed    Increase activity slowly   Complete by:  As directed      Allergies as of 11/26/2018      Reactions   Acetaminophen Nausea And Vomiting   Patient states Acetaminophen and Acetaminophen containing products make her nauseated. She demonstrated this 06/21/18 with nausea followed by emesis.   Hydroxyzine Itching      Medication List    TAKE these medications   Accu-Chek Aviva device Use as instructed   accu-chek soft touch lancets Use twice per day.   amLODipine 5 MG tablet Commonly known as:  NORVASC Take 1 tablet (5 mg total) by mouth at bedtime.   calcitRIOL 0.25 MCG capsule Commonly known as:  ROCALTROL Take 1 capsule (0.25 mcg total) by mouth every Monday, Wednesday, and Friday with hemodialysis.   Dialyvite 800-Zinc 15 0.8 MG Tabs Take 1 tablet by mouth daily.   feeding supplement (PRO-STAT SUGAR FREE 64) Liqd Take 30 mLs by mouth 2 (two) times daily.   glucose blood test strip Commonly known as:  Accu-Chek Aviva Use twice per day.   insulin aspart 100 UNIT/ML injection Commonly known as:  novoLOG Inject 5 Units into the skin 3 (three) times daily with meals. What changed:  how much to take   insulin glargine 100 UNIT/ML injection Commonly known as:  LANTUS Inject 0.03 mLs (3 Units total) into the skin at bedtime. What changed:  how much to take   INSULIN SYRINGE .3CC/31GX5/16" 31G X 5/16" 0.3 ML Misc Inject 1 each into the skin 4 (four) times daily.   multivitamin Tabs tablet Take 1 tablet by mouth  at bedtime.   pantoprazole 40 MG tablet Commonly known as:  Protonix Take 1 tablet (40 mg total) by mouth daily.   sevelamer carbonate 800 MG tablet Commonly known as:  RENVELA Take 2 tablets (1,600 mg total) by mouth 3 (three) times daily with meals.        SignedBonnell Public 11/26/2018, 1:03 PM

## 2018-11-26 NOTE — Progress Notes (Signed)
Rhonda Lynch to be D/C'd Home per MD order.  Discussed prescriptions and follow up appointments with the patient. Prescriptions given to patient, medication list explained in detail. Pt verbalized understanding.  Allergies as of 11/26/2018      Reactions   Acetaminophen Nausea And Vomiting   Patient states Acetaminophen and Acetaminophen containing products make her nauseated. She demonstrated this 06/21/18 with nausea followed by emesis.   Hydroxyzine Itching      Medication List    TAKE these medications   Accu-Chek Aviva device Use as instructed   accu-chek soft touch lancets Use twice per day.   amLODipine 5 MG tablet Commonly known as:  NORVASC Take 1 tablet (5 mg total) by mouth at bedtime.   calcitRIOL 0.25 MCG capsule Commonly known as:  ROCALTROL Take 1 capsule (0.25 mcg total) by mouth every Monday, Wednesday, and Friday with hemodialysis.   Dialyvite 800-Zinc 15 0.8 MG Tabs Take 1 tablet by mouth daily.   feeding supplement (PRO-STAT SUGAR FREE 64) Liqd Take 30 mLs by mouth 2 (two) times daily.   glucose blood test strip Commonly known as:  Accu-Chek Aviva Use twice per day.   insulin aspart 100 UNIT/ML injection Commonly known as:  novoLOG Inject 5 Units into the skin 3 (three) times daily with meals. What changed:  how much to take   insulin glargine 100 UNIT/ML injection Commonly known as:  LANTUS Inject 0.03 mLs (3 Units total) into the skin at bedtime. What changed:  how much to take   INSULIN SYRINGE .3CC/31GX5/16" 31G X 5/16" 0.3 ML Misc Inject 1 each into the skin 4 (four) times daily.   multivitamin Tabs tablet Take 1 tablet by mouth at bedtime.   pantoprazole 40 MG tablet Commonly known as:  Protonix Take 1 tablet (40 mg total) by mouth daily.   sevelamer carbonate 800 MG tablet Commonly known as:  RENVELA Take 2 tablets (1,600 mg total) by mouth 3 (three) times daily with meals.       Vitals:   11/26/18 0442 11/26/18 0858  BP:  (!) 147/67 (!) 144/80  Pulse: 86 90  Resp: 18 18  Temp: 98.2 F (36.8 C) 98.5 F (36.9 C)  SpO2: 97% 100%    Skin clean, dry and intact without evidence of skin break down, no evidence of skin tears noted. IV catheter discontinued intact. Site without signs and symptoms of complications. Dressing and pressure applied. Pt denies pain at this time. No complaints noted.  An After Visit Summary was printed and given to the patient. Patient escorted via [place], and D/C home via private auto.

## 2018-12-02 ENCOUNTER — Other Ambulatory Visit: Payer: Self-pay | Admitting: Internal Medicine

## 2018-12-03 ENCOUNTER — Inpatient Hospital Stay (HOSPITAL_COMMUNITY): Payer: Medicare Other

## 2018-12-03 ENCOUNTER — Emergency Department (HOSPITAL_COMMUNITY): Payer: Medicare Other

## 2018-12-03 ENCOUNTER — Encounter (HOSPITAL_COMMUNITY): Payer: Self-pay

## 2018-12-03 ENCOUNTER — Inpatient Hospital Stay (HOSPITAL_COMMUNITY)
Admission: EM | Admit: 2018-12-03 | Discharge: 2018-12-12 | DRG: 981 | Disposition: A | Discharge status: Home / Self Care | Payer: Medicare Other | Attending: Internal Medicine | Admitting: Internal Medicine

## 2018-12-03 DIAGNOSIS — N2581 Secondary hyperparathyroidism of renal origin: Secondary | ICD-10-CM | POA: Diagnosis present

## 2018-12-03 DIAGNOSIS — T8249XA Other complication of vascular dialysis catheter, initial encounter: Secondary | ICD-10-CM

## 2018-12-03 DIAGNOSIS — T82818A Embolism of vascular prosthetic devices, implants and grafts, initial encounter: Secondary | ICD-10-CM | POA: Diagnosis present

## 2018-12-03 DIAGNOSIS — Z794 Long term (current) use of insulin: Secondary | ICD-10-CM

## 2018-12-03 DIAGNOSIS — R7989 Other specified abnormal findings of blood chemistry: Secondary | ICD-10-CM | POA: Diagnosis present

## 2018-12-03 DIAGNOSIS — J9621 Acute and chronic respiratory failure with hypoxia: Secondary | ICD-10-CM | POA: Diagnosis not present

## 2018-12-03 DIAGNOSIS — T8249XD Other complication of vascular dialysis catheter, subsequent encounter: Secondary | ICD-10-CM | POA: Diagnosis not present

## 2018-12-03 DIAGNOSIS — F1410 Cocaine abuse, uncomplicated: Secondary | ICD-10-CM | POA: Diagnosis present

## 2018-12-03 DIAGNOSIS — E875 Hyperkalemia: Secondary | ICD-10-CM | POA: Diagnosis present

## 2018-12-03 DIAGNOSIS — R1084 Generalized abdominal pain: Secondary | ICD-10-CM | POA: Diagnosis present

## 2018-12-03 DIAGNOSIS — E1122 Type 2 diabetes mellitus with diabetic chronic kidney disease: Secondary | ICD-10-CM | POA: Diagnosis present

## 2018-12-03 DIAGNOSIS — T82868A Thrombosis of vascular prosthetic devices, implants and grafts, initial encounter: Secondary | ICD-10-CM | POA: Diagnosis not present

## 2018-12-03 DIAGNOSIS — Z992 Dependence on renal dialysis: Secondary | ICD-10-CM

## 2018-12-03 DIAGNOSIS — J9601 Acute respiratory failure with hypoxia: Secondary | ICD-10-CM

## 2018-12-03 DIAGNOSIS — D631 Anemia in chronic kidney disease: Secondary | ICD-10-CM | POA: Diagnosis present

## 2018-12-03 DIAGNOSIS — I120 Hypertensive chronic kidney disease with stage 5 chronic kidney disease or end stage renal disease: Secondary | ICD-10-CM | POA: Diagnosis present

## 2018-12-03 DIAGNOSIS — A599 Trichomoniasis, unspecified: Secondary | ICD-10-CM | POA: Diagnosis present

## 2018-12-03 DIAGNOSIS — J9600 Acute respiratory failure, unspecified whether with hypoxia or hypercapnia: Secondary | ICD-10-CM

## 2018-12-03 DIAGNOSIS — Z9115 Patient's noncompliance with renal dialysis: Secondary | ICD-10-CM

## 2018-12-03 DIAGNOSIS — Z03818 Encounter for observation for suspected exposure to other biological agents ruled out: Secondary | ICD-10-CM

## 2018-12-03 DIAGNOSIS — M898X9 Other specified disorders of bone, unspecified site: Secondary | ICD-10-CM | POA: Diagnosis present

## 2018-12-03 DIAGNOSIS — I959 Hypotension, unspecified: Secondary | ICD-10-CM | POA: Diagnosis not present

## 2018-12-03 DIAGNOSIS — N186 End stage renal disease: Secondary | ICD-10-CM | POA: Diagnosis present

## 2018-12-03 DIAGNOSIS — I9581 Postprocedural hypotension: Secondary | ICD-10-CM | POA: Diagnosis not present

## 2018-12-03 DIAGNOSIS — J810 Acute pulmonary edema: Secondary | ICD-10-CM | POA: Diagnosis not present

## 2018-12-03 DIAGNOSIS — A419 Sepsis, unspecified organism: Secondary | ICD-10-CM | POA: Diagnosis not present

## 2018-12-03 DIAGNOSIS — I160 Hypertensive urgency: Secondary | ICD-10-CM | POA: Diagnosis present

## 2018-12-03 DIAGNOSIS — A0472 Enterocolitis due to Clostridium difficile, not specified as recurrent: Secondary | ICD-10-CM | POA: Diagnosis present

## 2018-12-03 DIAGNOSIS — Y832 Surgical operation with anastomosis, bypass or graft as the cause of abnormal reaction of the patient, or of later complication, without mention of misadventure at the time of the procedure: Secondary | ICD-10-CM | POA: Diagnosis present

## 2018-12-03 DIAGNOSIS — E872 Acidosis: Secondary | ICD-10-CM | POA: Diagnosis present

## 2018-12-03 DIAGNOSIS — J702 Acute drug-induced interstitial lung disorders: Secondary | ICD-10-CM | POA: Diagnosis present

## 2018-12-03 DIAGNOSIS — Z01818 Encounter for other preprocedural examination: Secondary | ICD-10-CM

## 2018-12-03 DIAGNOSIS — Z8673 Personal history of transient ischemic attack (TIA), and cerebral infarction without residual deficits: Secondary | ICD-10-CM

## 2018-12-03 DIAGNOSIS — F17210 Nicotine dependence, cigarettes, uncomplicated: Secondary | ICD-10-CM | POA: Diagnosis present

## 2018-12-03 DIAGNOSIS — B1920 Unspecified viral hepatitis C without hepatic coma: Secondary | ICD-10-CM | POA: Diagnosis present

## 2018-12-03 DIAGNOSIS — T405X1A Poisoning by cocaine, accidental (unintentional), initial encounter: Principal | ICD-10-CM | POA: Diagnosis present

## 2018-12-03 DIAGNOSIS — R451 Restlessness and agitation: Secondary | ICD-10-CM | POA: Diagnosis not present

## 2018-12-03 DIAGNOSIS — J811 Chronic pulmonary edema: Secondary | ICD-10-CM

## 2018-12-03 DIAGNOSIS — R0902 Hypoxemia: Secondary | ICD-10-CM | POA: Diagnosis not present

## 2018-12-03 DIAGNOSIS — E8770 Fluid overload, unspecified: Secondary | ICD-10-CM | POA: Diagnosis present

## 2018-12-03 DIAGNOSIS — Z9114 Patient's other noncompliance with medication regimen: Secondary | ICD-10-CM

## 2018-12-03 LAB — CBC WITH DIFFERENTIAL/PLATELET
Abs Immature Granulocytes: 0.06 10*3/uL (ref 0.00–0.07)
Basophils Absolute: 0.1 10*3/uL (ref 0.0–0.1)
Basophils Relative: 0 %
Eosinophils Absolute: 0 10*3/uL (ref 0.0–0.5)
Eosinophils Relative: 0 %
HCT: 36.2 % (ref 36.0–46.0)
Hemoglobin: 10.7 g/dL — ABNORMAL LOW (ref 12.0–15.0)
Immature Granulocytes: 0 %
Lymphocytes Relative: 16 %
Lymphs Abs: 2.3 10*3/uL (ref 0.7–4.0)
MCH: 24 pg — ABNORMAL LOW (ref 26.0–34.0)
MCHC: 29.6 g/dL — ABNORMAL LOW (ref 30.0–36.0)
MCV: 81.3 fL (ref 80.0–100.0)
Monocytes Absolute: 0.9 10*3/uL (ref 0.1–1.0)
Monocytes Relative: 6 %
Neutro Abs: 10.8 10*3/uL — ABNORMAL HIGH (ref 1.7–7.7)
Neutrophils Relative %: 78 %
Platelets: 235 10*3/uL (ref 150–400)
RBC: 4.45 MIL/uL (ref 3.87–5.11)
RDW: 21.8 % — ABNORMAL HIGH (ref 11.5–15.5)
WBC: 14.1 10*3/uL — ABNORMAL HIGH (ref 4.0–10.5)
nRBC: 0 % (ref 0.0–0.2)

## 2018-12-03 LAB — POCT I-STAT 7, (LYTES, BLD GAS, ICA,H+H)
ACID-BASE EXCESS: 1 mmol/L (ref 0.0–2.0)
Acid-Base Excess: 1 mmol/L (ref 0.0–2.0)
Bicarbonate: 25.4 mmol/L (ref 20.0–28.0)
Bicarbonate: 27.2 mmol/L (ref 20.0–28.0)
Calcium, Ion: 1.14 mmol/L — ABNORMAL LOW (ref 1.15–1.40)
Calcium, Ion: 1.14 mmol/L — ABNORMAL LOW (ref 1.15–1.40)
HCT: 34 % — ABNORMAL LOW (ref 36.0–46.0)
HCT: 41 % (ref 36.0–46.0)
HEMOGLOBIN: 11.6 g/dL — AB (ref 12.0–15.0)
Hemoglobin: 13.9 g/dL (ref 12.0–15.0)
O2 Saturation: 100 %
O2 Saturation: 95 %
Patient temperature: 101.5
Patient temperature: 98
Potassium: 3.5 mmol/L (ref 3.5–5.1)
Potassium: 3.6 mmol/L (ref 3.5–5.1)
Sodium: 137 mmol/L (ref 135–145)
Sodium: 138 mmol/L (ref 135–145)
TCO2: 27 mmol/L (ref 22–32)
TCO2: 29 mmol/L (ref 22–32)
pCO2 arterial: 37.4 mmHg (ref 32.0–48.0)
pCO2 arterial: 52.6 mmHg — ABNORMAL HIGH (ref 32.0–48.0)
pH, Arterial: 7.329 — ABNORMAL LOW (ref 7.350–7.450)
pH, Arterial: 7.439 (ref 7.350–7.450)
pO2, Arterial: 340 mmHg — ABNORMAL HIGH (ref 83.0–108.0)
pO2, Arterial: 72 mmHg — ABNORMAL LOW (ref 83.0–108.0)

## 2018-12-03 LAB — URINALYSIS, ROUTINE W REFLEX MICROSCOPIC
Bilirubin Urine: NEGATIVE
Glucose, UA: 100 mg/dL — AB
Ketones, ur: NEGATIVE mg/dL
Nitrite: NEGATIVE
PH: 6 (ref 5.0–8.0)
Protein, ur: >300 mg/dL — AB
Specific Gravity, Urine: 1.02 (ref 1.005–1.030)

## 2018-12-03 LAB — COMPREHENSIVE METABOLIC PANEL
ALT: 21 U/L (ref 0–44)
AST: 43 U/L — ABNORMAL HIGH (ref 15–41)
Albumin: 2.6 g/dL — ABNORMAL LOW (ref 3.5–5.0)
Alkaline Phosphatase: 63 U/L (ref 38–126)
Anion gap: 15 (ref 5–15)
BUN: 83 mg/dL — ABNORMAL HIGH (ref 8–23)
CO2: 18 mmol/L — AB (ref 22–32)
Calcium: 8.8 mg/dL — ABNORMAL LOW (ref 8.9–10.3)
Chloride: 105 mmol/L (ref 98–111)
Creatinine, Ser: 13.17 mg/dL — ABNORMAL HIGH (ref 0.44–1.00)
GFR calc Af Amer: 3 mL/min — ABNORMAL LOW (ref >60)
GFR calc non Af Amer: 3 mL/min — ABNORMAL LOW (ref >60)
Glucose, Bld: 120 mg/dL — ABNORMAL HIGH (ref 70–99)
Potassium: 5.4 mmol/L — ABNORMAL HIGH (ref 3.5–5.1)
SODIUM: 138 mmol/L (ref 135–145)
Total Bilirubin: 0.9 mg/dL (ref 0.3–1.2)
Total Protein: 7.4 g/dL (ref 6.5–8.1)

## 2018-12-03 LAB — RAPID URINE DRUG SCREEN, HOSP PERFORMED
AMPHETAMINES: NOT DETECTED
Barbiturates: NOT DETECTED
Benzodiazepines: NOT DETECTED
Cocaine: POSITIVE — AB
Opiates: NOT DETECTED
Tetrahydrocannabinol: NOT DETECTED

## 2018-12-03 LAB — URINALYSIS, MICROSCOPIC (REFLEX)

## 2018-12-03 LAB — GLUCOSE, CAPILLARY: Glucose-Capillary: 115 mg/dL — ABNORMAL HIGH (ref 70–99)

## 2018-12-03 LAB — LIPASE, BLOOD: Lipase: 33 U/L (ref 11–51)

## 2018-12-03 LAB — LACTIC ACID, PLASMA
LACTIC ACID, VENOUS: 2.4 mmol/L — AB (ref 0.5–1.9)
LACTIC ACID, VENOUS: 2.3 mmol/L — AB (ref 0.5–1.9)

## 2018-12-03 LAB — BRAIN NATRIURETIC PEPTIDE: B Natriuretic Peptide: >4500 pg/mL — ABNORMAL HIGH (ref 0.0–100.0)

## 2018-12-03 LAB — INFLUENZA PANEL BY PCR (TYPE A & B)
INFLBPCR: NEGATIVE
Influenza A By PCR: NEGATIVE

## 2018-12-03 LAB — TROPONIN I: Troponin I: 0.18 ng/mL (ref <0.03)

## 2018-12-03 MED ORDER — VANCOMYCIN VARIABLE DOSE PER UNSTABLE RENAL FUNCTION (PHARMACIST DOSING): Status: DC

## 2018-12-03 MED ORDER — PROPOFOL 1000 MG/100ML IV EMUL
0.0000 ug/kg/min | INTRAVENOUS | Status: DC
  Administered 2018-12-03: 30 ug/kg/min via INTRAVENOUS
  Filled 2018-12-03: qty 100

## 2018-12-03 MED ORDER — MORPHINE SULFATE (PF) 2 MG/ML IV SOLN: 1.0000 mg | INTRAVENOUS | Status: DC | PRN

## 2018-12-03 MED ORDER — HYDROMORPHONE HCL 1 MG/ML IJ SOLN
0.5000 mg | Freq: Once | INTRAMUSCULAR | Status: AC
  Administered 2018-12-03: 0.5 mg via INTRAVENOUS

## 2018-12-03 MED ORDER — PANTOPRAZOLE SODIUM 40 MG IV SOLR
40.0000 mg | Freq: Two times a day (BID) | INTRAVENOUS | Status: DC
  Administered 2018-12-03 – 2018-12-07 (×9): 40 mg via INTRAVENOUS
  Filled 2018-12-03 (×9): qty 40

## 2018-12-03 MED ORDER — FENTANYL CITRATE (PF) 100 MCG/2ML IJ SOLN
25.0000 ug | Freq: Once | INTRAMUSCULAR | Status: AC
  Administered 2018-12-03: 25 ug via INTRAVENOUS
  Filled 2018-12-03: qty 2

## 2018-12-03 MED ORDER — ETOMIDATE 2 MG/ML IV SOLN
20.0000 mg | Freq: Once | INTRAVENOUS | Status: AC
  Administered 2018-12-03: 20 mg via INTRAVENOUS

## 2018-12-03 MED ORDER — IOHEXOL 300 MG/ML  SOLN
100.0000 mL | Freq: Once | INTRAMUSCULAR | Status: AC | PRN
  Administered 2018-12-03: 100 mL via INTRAVENOUS

## 2018-12-03 MED ORDER — FENTANYL 2500MCG IN NS 250ML (10MCG/ML) PREMIX INFUSION
0.0000 ug/h | INTRAVENOUS | Status: DC
  Administered 2018-12-03: 50 ug/h via INTRAVENOUS
  Administered 2018-12-05 – 2018-12-06 (×2): 200 ug/h via INTRAVENOUS
  Filled 2018-12-03 (×3): qty 250

## 2018-12-03 MED ORDER — AMLODIPINE BESYLATE 5 MG PO TABS: 5.0000 mg | ORAL_TABLET | Freq: Every day | ORAL | Status: DC

## 2018-12-03 MED ORDER — CHLORHEXIDINE GLUCONATE CLOTH 2 % EX PADS
6.0000 | MEDICATED_PAD | Freq: Every day | CUTANEOUS | Status: DC
  Administered 2018-12-04 – 2018-12-08 (×4): 6 via TOPICAL

## 2018-12-03 MED ORDER — METRONIDAZOLE IN NACL 5-0.79 MG/ML-% IV SOLN
500.0000 mg | Freq: Once | INTRAVENOUS | Status: DC
  Filled 2018-12-03: qty 100

## 2018-12-03 MED ORDER — DOXERCALCIFEROL 4 MCG/2ML IV SOLN
4.0000 ug | INTRAVENOUS | Status: DC
  Filled 2018-12-03: qty 2

## 2018-12-03 MED ORDER — FENTANYL CITRATE (PF) 100 MCG/2ML IJ SOLN: 100.0000 ug | INTRAMUSCULAR | Status: DC | PRN

## 2018-12-03 MED ORDER — HYDROMORPHONE HCL 1 MG/ML IJ SOLN
0.2500 mg | Freq: Once | INTRAMUSCULAR | Status: AC
  Administered 2018-12-03: 0.25 mg via INTRAVENOUS
  Filled 2018-12-03: qty 1

## 2018-12-03 MED ORDER — DEXMEDETOMIDINE HCL IN NACL 400 MCG/100ML IV SOLN
0.4000 ug/kg/h | INTRAVENOUS | Status: DC
  Administered 2018-12-03: 0.4 ug/kg/h via INTRAVENOUS
  Administered 2018-12-04: 0.6 ug/kg/h via INTRAVENOUS
  Filled 2018-12-03 (×3): qty 100

## 2018-12-03 MED ORDER — MORPHINE SULFATE (PF) 4 MG/ML IV SOLN
4.0000 mg | Freq: Once | INTRAVENOUS | Status: AC
  Administered 2018-12-03: 4 mg via INTRAVENOUS

## 2018-12-03 MED ORDER — HEPARIN SODIUM (PORCINE) 1000 UNIT/ML IJ SOLN
INTRAMUSCULAR | Status: AC
  Administered 2018-12-03: 3400 [IU]
  Filled 2018-12-03: qty 4

## 2018-12-03 MED ORDER — HYDRALAZINE HCL 20 MG/ML IJ SOLN
10.0000 mg | INTRAMUSCULAR | Status: DC | PRN
  Administered 2018-12-07: 10 mg via INTRAVENOUS
  Filled 2018-12-03: qty 1

## 2018-12-03 MED ORDER — SODIUM CHLORIDE 0.9 % IV SOLN
1.0000 g | INTRAVENOUS | Status: DC
  Filled 2018-12-03: qty 1

## 2018-12-03 MED ORDER — ORAL CARE MOUTH RINSE
15.0000 mL | OROMUCOSAL | Status: DC
  Administered 2018-12-04 – 2018-12-06 (×27): 15 mL via OROMUCOSAL

## 2018-12-03 MED ORDER — CHLORHEXIDINE GLUCONATE 0.12% ORAL RINSE (MEDLINE KIT)
15.0000 mL | Freq: Two times a day (BID) | OROMUCOSAL | Status: DC
  Administered 2018-12-04 – 2018-12-06 (×6): 15 mL via OROMUCOSAL

## 2018-12-03 MED ORDER — MIDAZOLAM HCL 2 MG/2ML IJ SOLN
INTRAMUSCULAR | Status: AC
  Administered 2018-12-03: 2 mg
  Filled 2018-12-03: qty 2

## 2018-12-03 MED ORDER — VANCOMYCIN HCL IN DEXTROSE 1-5 GM/200ML-% IV SOLN
1000.0000 mg | Freq: Once | INTRAVENOUS | Status: AC
  Administered 2018-12-03: 1000 mg via INTRAVENOUS
  Filled 2018-12-03: qty 200

## 2018-12-03 MED ORDER — HEPARIN SODIUM (PORCINE) 5000 UNIT/ML IJ SOLN
5000.0000 [IU] | Freq: Three times a day (TID) | INTRAMUSCULAR | Status: DC
  Administered 2018-12-03 – 2018-12-12 (×21): 5000 [IU] via SUBCUTANEOUS
  Filled 2018-12-03 (×21): qty 1

## 2018-12-03 MED ORDER — NITROGLYCERIN 0.4 MG SL SUBL
SUBLINGUAL_TABLET | SUBLINGUAL | Status: AC
  Filled 2018-12-03: qty 1

## 2018-12-03 MED ORDER — MORPHINE SULFATE (PF) 4 MG/ML IV SOLN
INTRAVENOUS | Status: AC
  Administered 2018-12-03: 4 mg via INTRAVENOUS
  Filled 2018-12-03: qty 1

## 2018-12-03 MED ORDER — NITROGLYCERIN IN D5W 200-5 MCG/ML-% IV SOLN: 0.0000 ug/min | INTRAVENOUS | Status: DC

## 2018-12-03 MED ORDER — METOPROLOL TARTRATE 5 MG/5ML IV SOLN: 5.0000 mg | INTRAVENOUS | Status: DC | PRN

## 2018-12-03 MED ORDER — ROCURONIUM BROMIDE 50 MG/5ML IV SOLN
50.0000 mg | Freq: Once | INTRAVENOUS | Status: AC
  Administered 2018-12-03: 50 mg via INTRAVENOUS

## 2018-12-03 MED ORDER — HYDROMORPHONE HCL 1 MG/ML IJ SOLN
INTRAMUSCULAR | Status: AC
  Filled 2018-12-03: qty 0.5

## 2018-12-03 NOTE — Progress Notes (Signed)
eLink Physician-Brief Progress Note Patient Name: Rhonda Lynch DOB: Mar 31, 1956 MRN: [id number]   Date of Service  12/03/2018  HPI/Events of Note  Acute pulmonary edema due to volume overload, ESRD, acute respiratory failure due to pulmonary edema  eICU Interventions  Nitroglycerine infusion, Morphine 1-4 mg iv prn, Intubation-I requested Dr. Carson Myrtle to see regarding intubation.        [name] 12/03/2018, 10:39 PM

## 2018-12-03 NOTE — Progress Notes (Signed)
Pharmacy Antibiotic Note  Rhonda Lynch is a 63 y.o. female admitted on 12/03/2018 with sepsis.  Pharmacy has been consulted for Vanc and Cefepime dosing. Patient is a dialysis patient.  Plan: Cefepime 1 gm IV q24hr Vanc 1000 mg IV x 1, then variable dosing by pharmacy based on dialysis schedule. Monitor renal function/dialysis, C&S, and vanc levels  Weight: 121 lb 4.1 oz (55 kg)  No data recorded.  No results for input(s): WBC, CREATININE, LATICACIDVEN, VANCOTROUGH, VANCOPEAK, VANCORANDOM, GENTTROUGH, GENTPEAK, GENTRANDOM, TOBRATROUGH, TOBRAPEAK, TOBRARND, AMIKACINPEAK, AMIKACINTROU, AMIKACIN in the last 168 hours.  Estimated Creatinine Clearance: 7.5 mL/min (A) (by C-G formula based on SCr of 5.67 mg/dL (H)).    Allergies  Allergen Reactions  . Acetaminophen Nausea And Vomiting    Patient states Acetaminophen and Acetaminophen containing products make her nauseated. She demonstrated this 06/21/18 with nausea followed by emesis.  Marland Kitchen Hydroxyzine Itching    Antimicrobials this admission: Vanc 3/25 >>  Cefepime 3/25 >>   Thank you for allowing pharmacy to be a part of this patient's care.  Alanda Slim, PharmD, [facility] Clinical Pharmacist Please see AMION for all Pharmacists' Contact Phone Numbers 12/03/2018, 12:42 PM

## 2018-12-03 NOTE — ED Notes (Signed)
Pt experiencing hematemesis. PA, MD aware.

## 2018-12-03 NOTE — ED Notes (Addendum)
Troponin level 0.18- EDP [place] aware.

## 2018-12-03 NOTE — H&P (Signed)
NAME:  Rhonda Lynch, MRN:  [id number], DOB:  12-16-1955, LOS: 0 ADMISSION DATE:  12/03/2018, CONSULTATION DATE:  12/03/2018 REFERRING MD:  Dr. Alvino Chapel, CHIEF COMPLAINT:  SOB  Brief History   28 yoF ESRD TTS presenting with nausea, vomiting, abdominal pain, and slight SOB for ~ 3 days.  Last iHD -partial iHD on 3/21.  Found hypoxic requiring NRB, hypertensive, temp 100.5 rectal, with small amount of hematemesis in ER.  CXR c/w with bilateral infiltrates likely pulmonary edema.   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   Of note, patient recently admitted 3/8 -3/13 with bilateral lung infiltrates thought to be probable pulmonary edema vs pneumonia in the setting of missed dialysis , treated with iHD and antibiotics.  She was cocaine positive.  She was again admitted 3/17 - 3/18 with AMS thought related to overuse/ likely unintentional cocaine overdose.   Patient reports having partial iHD on 3/21.  Denies sick contacts, fever, known COVID -19 exposure, travel, or recent drug use.  She was found by EMS hypoxic with sats in the 80's.  In ER, she remained hypertensive, hypoxic, rectal temp 100.5. CXR showed bilateral infiltrates. Flu neg.  Labs noted for K 5.4, glucose 120, BUN 83, sCr 13.17, WBC 14.1, Hgb 10.7, Lactic 2.4, trop I 0.18 and BNP >4500, EKG non acute. She was treated with empiric abx coverage with vancomycin, cefepime, and flagyl.  PCCM called for admit.  CT abd/ pelvis and chest was ordered and pending in ER.   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  Received Plex, pulse dose steroids with prednisone taper and p.o. Cytoxan at that time.  She is no longer on immunosuppression.  Significant Hospital Events   3/8- 3/13 admit 3/17 -3/18 admit 3/25 Admit  Consults:  Nephrology  Procedures:   Significant Diagnostic Tests:  3/25 CT chest >> 3/25 CT abd/ pelvis >>  Micro Data:  3/25 BCx 2 >> 3/25 UC >> 3/25 Flu >> neg 3/25 RVP >>  Antimicrobials:  3/25 vanc x1  3/25 flagyl x 1 3/25 cefepime x1  Interim history/subjective:   Objective   Blood pressure (!) 174/112, pulse (!) 114, temperature (!) 100.5 F (38.1 C), temperature source Oral, resp. rate (!) 35, weight 55 kg, SpO2 98 %.       No intake or output data in the 24 hours ending 12/03/18 1447 Filed Weights   12/03/18 1143  Weight: 55 kg   Examination: Defer to Dr. Lamonte Sakai  [facility] Problem list    Assessment & Plan:  Acute hypoxic respiratory failure Bilateral infiltrates-  most likely consistent with pulmonary edema in the setting of missed iHD - less likely bacterial component   P:  Continue supplemental O2 for sat goal >92% BiPAP prn Nephrology consulted for iHD Sending RVP, continue droplet precautions for now Do not think this is COVID-19 related- no known exposure/ travel Trend CXR Hold on further abx for now, monitor clincially NPO  Hypertensive Urgency - setting of ?cocaine abuse,  non-compliance, and missed iHD P:  Needs iHD Apresoline prn SBP > 180 No beta blockers given known cocaine abuse UDS pending norvasc 5 mg daily  Trend troponin/ EKG  ESRD on TTS iHD Mild hyperkalemia P:  Nephrology consulted Trend renal function  DMT2  P:  SSI sensitive Hold lantus (home dose 5 units q HS)  Substance abuse Severe medical non-compliance  P:  Ongoing counseling Given this is her 3rd admit this month, consider PMT consult given ongoing noncompliance    Leukocytosis- mild - no prior reports of fever/ chills - temp 100.5 rectal  P:  Follow BC and UC  Monitor clinically for now, hold on further abx Pending RVP Trend CBC/ fever curve  N/V, small amount hematemesis - no diarrhea P:  NPO PPI BID Pending CT abd/ pelvis Hold on further GI consult guaiac stool    Chronic Normocytic Anemia P:  Monitor for further bleeding Trend CBC  Best practice:  Diet: NPO Pain/Anxiety/Delirium protocol (if indicated): n/a VAP protocol (if indicated): n/a DVT prophylaxis: heparin SQ/ SCDs GI prophylaxis: PPI BID Glucose control: SSI Mobility: BR Code Status: Full  Family Communication: patient updated on plan of care Disposition: ICU  Labs   CBC: Recent Labs  Lab 12/03/18 1208  WBC 14.1*  NEUTROABS 10.8*  HGB 10.7*  HCT 36.2  MCV 81.3  PLT 161    Basic Metabolic Panel: Recent Labs  Lab 12/03/18 1208  NA 138  K 5.4*  CL 105  CO2 18*  GLUCOSE 120*  BUN 83*  CREATININE 13.17*  CALCIUM 8.8*   GFR: Estimated Creatinine Clearance: 3.2 mL/min (A) (by C-G formula based on SCr of 13.17 mg/dL (H)). Recent Labs  Lab 12/03/18 1208 12/03/18 1250  WBC 14.1*  --   LATICACIDVEN  --  2.4*    Liver Function Tests: Recent Labs  Lab 12/03/18 1208  AST 43*  ALT 21  ALKPHOS 63  BILITOT 0.9  PROT 7.4  ALBUMIN 2.6*   Recent Labs  Lab 12/03/18 1208  LIPASE 33   No results for input(s): AMMONIA in the last 168 hours.  ABG    Component Value Date/Time   PHART 7.364 06/21/2018 0125   PCO2ART 41.7 06/21/2018 0125   PO2ART 130 (H) 06/21/2018 0125   HCO3 17.2 (L) 11/25/2018 0451   TCO2 18 (L) 11/25/2018 0451   ACIDBASEDEF 9.0 (H) 11/25/2018 0451   O2SAT 79.0 11/25/2018 0451     Coagulation Profile: No results for input(s): INR, PROTIME in the last 168 hours.  Cardiac Enzymes: Recent Labs  Lab 12/03/18 1223  TROPONINI 0.18*    HbA1C: Hgb A1c MFr Bld  Date/Time Value Ref Range Status  07/19/2018 05:34 AM 6.1  (H) 4.8 - 5.6 % Final    Comment:    (NOTE) Pre diabetes:          5.7%-6.4% Diabetes:              >6.4% Glycemic control for   <7.0% adults with diabetes   04/02/2018 02:45 PM 6.8 (H) 4.8 - 5.6 % Final    Comment:    (NOTE) Pre diabetes:          5.7%-6.4% Diabetes:              >6.4% Glycemic control for   <7.0% adults with diabetes     CBG: No results for input(s): GLUCAP in the last 168 hours.  Review of Systems:   POSITIVES IN BOLD Gen: Denies fever, chills, weight change  HEENT: Denies congestion, sore throat PULM: Denies shortness of breath, cough, sputum production, hemoptysis, wheezing CV: Denies chest pain, edema, orthopnea, GI: Denies abdominal pain, nausea, vomiting, diarrhea, hematochezia, melena, constipation, change in bowel habits Endocrine: Denies hot or cold intolerance, polyuria, polyphagia  Derm: Denies rash, skin change Heme: Denies easy bruising, bleeding Neuro: Denies headache, numbness, weakness, slurred speech, loss of memory or consciousness  Past Medical History  She,  has a past medical history of Diabetes mellitus without complication ([place]), ESRD (end stage renal disease) on dialysis ([place]), Hepatitis C, Hypertension, Oxygen deficiency, Stroke ([place]), and Substance abuse ([place]).   Surgical History    Past Surgical History:  Procedure Laterality Date  . AV FISTULA PLACEMENT Left 03/05/2018   Procedure: ARTERIOVENOUS (AV) FISTULA CREATION  LEFT UPPER EXTREMITY;  Surgeon: [name], MD;  Location: [place];  Service: Vascular;  Laterality: Left;  . AV FISTULA PLACEMENT Left 07/21/2018   Procedure: ARTERIOVENOUS (AV) FISTULA CREATION;  Surgeon: Marty Heck, MD;  Location: [place];  Service: Vascular;  Laterality: Left;  . AV FISTULA PLACEMENT Left 10/27/2018   Procedure: INSERTION OF ARTERIOVENOUS (AV) GORE-TEX GRAFT UPPER ARM;  Surgeon: Marty Heck, MD;  Location: [place];  Service: Vascular;  Laterality: Left;  . BASCILIC VEIN  TRANSPOSITION Left 09/29/2018   Procedure: BASILIC VEIN TRANSPOSITION SECOND STAGE LEFT UPPER ARM;  Surgeon: Marty Heck, MD;  Location: [place];  Service: Vascular;  Laterality: Left;  . HEMATOMA EVACUATION Left 03/06/2018   Procedure: EVACUATION HEMATOMA LEFT ARM;  Surgeon: Angelia Mould, MD;  Location: [place];  Service: Vascular;  Laterality: Left;  . I&D EXTREMITY Left 10/06/2018   Procedure: IRRIGATION AND DEBRIDEMENT ARM;  Surgeon: Marty Heck, MD;  Location: [place];  Service: Vascular;  Laterality: Left;  . INSERTION OF DIALYSIS CATHETER Left 04/29/2018   Procedure: INSERTION OF DIALYSIS CATHETER LEFT INTERNAL JUGULAR;  Surgeon: Angelia Mould, MD;  Location: [place];  Service: Vascular;  Laterality: Left;  . IR FLUORO GUIDE CV LINE RIGHT  02/18/2018  . IR FLUORO GUIDE CV LINE RIGHT  02/26/2018  . IR REMOVAL TUN CV CATH W/O FL  04/27/2018  . IR US GUIDE VASC ACCESS RIGHT  02/18/2018  . TEE WITHOUT CARDIOVERSION N/A 04/29/2018   Procedure: TRANSESOPHAGEAL ECHOCARDIOGRAM (TEE);  Surgeon: Sueanne Margarita, MD;  Location: [facility] ENDOSCOPY;  Service: Cardiovascular;  Laterality: N/A;  . VENOGRAM Left 10/27/2018   Procedure: LEFT CENTRAL VENOGRAM;  Surgeon: Marty Heck, MD;  Location: [place];  Service: Vascular;  Laterality: Left;     Social History   reports that she has been smoking cigarettes. She has a 24.00 pack-year smoking history. She has never used smokeless tobacco. She reports previous alcohol use. She reports current drug use. Drugs: Cocaine and Heroin.   Family History   Her family history is negative for Autoimmune disease.   Allergies Allergies  Allergen Reactions  . Acetaminophen Nausea And Vomiting    Patient states Acetaminophen and Acetaminophen containing products make her nauseated. She demonstrated this 06/21/18 with nausea followed by emesis.  Marland Kitchen Hydroxyzine Itching     Home Medications  Prior to Admission medications   Medication Sig  Start Date End Date Taking? Authorizing Provider  Amino Acids-Protein Hydrolys (FEEDING SUPPLEMENT, PRO-STAT SUGAR FREE 64,) LIQD Take 30 mLs by mouth 2 (two) times daily. 11/21/18   Ghimire, Henreitta Leber, MD  amLODipine (NORVASC) 5 MG tablet Take 1 tablet (5 mg total) by mouth at bedtime.  10/13/18   Alphonzo Grieve, MD  B Complex-C-Zn-Folic Acid (DIALYVITE 549-IYME 15) 0.8 MG TABS Take 1 tablet by mouth daily. 11/04/18   [provider]  Blood Glucose Monitoring Suppl (ACCU-CHEK AVIVA) device Use as instructed 10/29/18 10/29/19  Isabelle Course, MD  calcitRIOL (ROCALTROL) 0.25 MCG capsule Take 1 capsule (0.25 mcg total) by mouth every Monday, Wednesday, and Friday with hemodialysis. 10/29/18   Isabelle Course, MD  glucose blood (ACCU-CHEK AVIVA) test strip Use twice per day. 10/29/18   Isabelle Course, MD  insulin aspart (NOVOLOG) 100 UNIT/ML injection Inject 5 Units into the skin 3 (three) times daily with meals. Patient taking differently: Inject 3 Units into the skin 3 (three) times daily with meals.  10/13/18   Alphonzo Grieve, MD  insulin glargine (LANTUS) 100 UNIT/ML injection Inject 0.03 mLs (3 Units total) into the skin at bedtime. Patient taking differently: Inject 5 Units into the skin at bedtime.  10/29/18   Isabelle Course, MD  Insulin Syringe-Needle U-100 (INSULIN SYRINGE .3CC/31GX5/16") 31G X 5/16" 0.3 ML MISC Inject 1 each into the skin 4 (four) times daily. 06/27/18   Clent Demark, PA-C  Lancets (ACCU-CHEK SOFT [facility]) lancets Use twice per day. 10/29/18   Isabelle Course, MD  multivitamin (RENA-VIT) TABS tablet Take 1 tablet by mouth at bedtime. 10/13/18   Alphonzo Grieve, MD  pantoprazole (PROTONIX) 40 MG tablet Take 1 tablet (40 mg total) by mouth daily. 11/21/18   Ghimire, Henreitta Leber, MD  sevelamer carbonate (RENVELA) 800 MG tablet Take 2 tablets (1,600 mg total) by mouth 3 (three) times daily with meals. 11/26/18   Bonnell Public, MD     Critical care time: 75 mins    Kennieth Rad, MSN, AGACNP-BC [facility] Pgr: [phone] or if no answer [phone] 12/03/2018, 3:44 PM

## 2018-12-03 NOTE — Progress Notes (Signed)
Pt showed improvement in her breathing 2hrs into HD tx; she still continued to be tachypneic; was able to pull 2.5L of fluid but her condition changed with about 28mins of HD left and her respiration <40 sustained. Critical called pt was intubated.

## 2018-12-03 NOTE — Consult Note (Addendum)
[facility] Renal Consultation Note    Indication for Consultation:  Management of ESRD/hemodialysis; anemia, hypertension/volume and secondary hyperparathyroidism Referring physician: Davonna Belling, MD EDP  HPI: This is the third admission this month for  Rhonda Lynch is a 63 y.o. 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  She was discharged 3/8 for PNA on cefdinir through 3/14, readmitted 3/17 and d/c 3/18 due to "unintentional" cocaine overdose.  Since That discharge she missed HD on 3/19, went for 2 hr on 3/21 and missed 3/24.  She presented to the ED today with  Abdominal pain, N, V, diarrhea fever. Per records,  she denies CP, and known exposure to anyone positive for COVID-19.  She has been living with a friend and presented in the hospital scrubs given to her when she was last discharged from the hospital. She was afebrile during her last outpatient HD treatment 3/24 and signed off after 2 hr with net UF of 0.2 post wt 60.1 ABOVE her pre HD wt BP were 140/80 - 90s post HD.   Upon presentation today temp was 100.5 BP elevated 160 - 170 -90 - 103 P 114- 120.  WBC is elevated at 13. 1 hgb 10.7 K 5.4 CO2 18 Ca 8.8 alb 2.6 lipase 33  BNP 4500 Trop 0.18  Lactic acid 2.4 Resp panel pending, Influenza A and B negative,   CXR shows significantly increased bilateral lung opacities R > L concerning for PNA/edema with small right pleural effusion noted.  Past Medical History:  Diagnosis Date  . Diabetes mellitus without complication ([place])   . ESRD (end stage renal disease) on dialysis ([place])    "TTS; [facility]; they're moving me to another one" (09/22/2018)  . Hepatitis C    diagnosed 09/2018  . Hypertension   . Oxygen deficiency    2 L at night  . Stroke ([place])   . Substance abuse ([place])    has been clean for 4 months    Past Surgical History:  Procedure Laterality Date  . AV FISTULA PLACEMENT Left 03/05/2018   Procedure: ARTERIOVENOUS (AV) FISTULA CREATION  LEFT UPPER EXTREMITY;  Surgeon: [name], MD;  Location: [place];  Service: Vascular;  Laterality: Left;  . AV FISTULA PLACEMENT Left 07/21/2018   Procedure: ARTERIOVENOUS (AV) FISTULA CREATION;  Surgeon: Marty Heck, MD;  Location: [place];  Service: Vascular;  Laterality: Left;  . AV FISTULA PLACEMENT Left 10/27/2018   Procedure: INSERTION OF ARTERIOVENOUS (AV) GORE-TEX GRAFT UPPER ARM;  Surgeon: Marty Heck, MD;  Location: [place];  Service: Vascular;  Laterality: Left;  . BASCILIC VEIN TRANSPOSITION Left 09/29/2018   Procedure: BASILIC VEIN TRANSPOSITION SECOND STAGE LEFT UPPER ARM;  Surgeon: Marty Heck, MD;  Location: [place];  Service: Vascular;  Laterality: Left;  . HEMATOMA EVACUATION Left 03/06/2018   Procedure: EVACUATION HEMATOMA LEFT ARM;  Surgeon: Angelia Mould, MD;  Location: [place];  Service: Vascular;  Laterality: Left;  . I&D EXTREMITY Left 10/06/2018   Procedure: IRRIGATION AND DEBRIDEMENT ARM;  Surgeon: Marty Heck, MD;  Location: [place];  Service: Vascular;  Laterality: Left;  . INSERTION OF DIALYSIS CATHETER Left 04/29/2018   Procedure: INSERTION OF DIALYSIS CATHETER LEFT INTERNAL JUGULAR;  Surgeon: Angelia Mould, MD;  Location: [place];  Service: Vascular;  Laterality: Left;  . IR FLUORO GUIDE CV LINE RIGHT  02/18/2018  . IR FLUORO GUIDE CV LINE  RIGHT  02/26/2018  . IR REMOVAL TUN CV CATH W/O FL  04/27/2018  . IR US GUIDE VASC ACCESS RIGHT  02/18/2018  . TEE WITHOUT CARDIOVERSION N/A 04/29/2018   Procedure: TRANSESOPHAGEAL ECHOCARDIOGRAM (TEE);  Surgeon: Sueanne Margarita, MD;  Location: [facility] ENDOSCOPY;  Service: Cardiovascular;  Laterality: N/A;  . VENOGRAM Left 10/27/2018   Procedure: LEFT CENTRAL VENOGRAM;  Surgeon: Marty Heck, MD;  Location: [facility] OR;  Service: Vascular;  Laterality: Left;   Family History  Problem Relation Age of Onset  . Autoimmune disease Neg Hx    Social  History:  reports that she has been smoking cigarettes. She has a 24.00 pack-year smoking history. She has never used smokeless tobacco. She reports previous alcohol use. She reports current drug use. Drugs: Cocaine and Heroin. Allergies  Allergen Reactions  . Acetaminophen Nausea And Vomiting    Patient states Acetaminophen and Acetaminophen containing products make her nauseated. She demonstrated this 06/21/18 with nausea followed by emesis.  Marland Kitchen Hydroxyzine Itching   Prior to Admission medications   Medication Sig Start Date End Date Taking? Authorizing Provider  Amino Acids-Protein Hydrolys (FEEDING SUPPLEMENT, PRO-STAT SUGAR FREE 64,) LIQD Take 30 mLs by mouth 2 (two) times daily. 11/21/18   Ghimire, Henreitta Leber, MD  amLODipine (NORVASC) 5 MG tablet Take 1 tablet (5 mg total) by mouth at bedtime. 10/13/18   Alphonzo Grieve, MD  B Complex-C-Zn-Folic Acid (DIALYVITE 505-LZJQ 15) 0.8 MG TABS Take 1 tablet by mouth daily. 11/04/18   [provider]  Blood Glucose Monitoring Suppl (ACCU-CHEK AVIVA) device Use as instructed 10/29/18 10/29/19  Isabelle Course, MD  calcitRIOL (ROCALTROL) 0.25 MCG capsule Take 1 capsule (0.25 mcg total) by mouth every Monday, Wednesday, and Friday with hemodialysis. 10/29/18   Isabelle Course, MD  glucose blood (ACCU-CHEK AVIVA) test strip Use twice per day. 10/29/18   Isabelle Course, MD  insulin aspart (NOVOLOG) 100 UNIT/ML injection Inject 5 Units into the skin 3 (three) times daily with meals. Patient taking differently: Inject 3 Units into the skin 3 (three) times daily with meals.  10/13/18   Alphonzo Grieve, MD  insulin glargine (LANTUS) 100 UNIT/ML injection Inject 0.03 mLs (3 Units total) into the skin at bedtime. Patient taking differently: Inject 5 Units into the skin at bedtime.  10/29/18   Isabelle Course, MD  Insulin Syringe-Needle U-100 (INSULIN SYRINGE .3CC/31GX5/16") 31G X 5/16" 0.3 ML MISC Inject 1 each into the skin 4 (four) times daily. 06/27/18   Clent Demark, PA-C  Lancets (ACCU-CHEK SOFT [facility]) lancets Use twice per day. 10/29/18   Isabelle Course, MD  multivitamin (RENA-VIT) TABS tablet Take 1 tablet by mouth at bedtime. 10/13/18   Alphonzo Grieve, MD  pantoprazole (PROTONIX) 40 MG tablet Take 1 tablet (40 mg total) by mouth daily. 11/21/18   Ghimire, Henreitta Leber, MD  sevelamer carbonate (RENVELA) 800 MG tablet Take 2 tablets (1,600 mg total) by mouth 3 (three) times daily with meals. 11/26/18   Bonnell Public, MD   Current Facility-Administered Medications  Medication Dose Route Frequency Provider Last Rate Last Dose  . [START ON 12/04/2018] Chlorhexidine Gluconate Cloth 2 % PADS 6 each  6 each Topical Q0600 Alric Seton, PA-C      . [START ON 12/04/2018] doxercalciferol (HECTOROL) injection 4 mcg  4 mcg Intravenous Q T,Th,Sa-HD Alric Seton, PA-C      . heparin injection 5,000 Units  5,000 Units Subcutaneous Q8H Arnell Asal, NP      .  pantoprazole (PROTONIX) injection 40 mg  40 mg Intravenous Q12H Arnell Asal, NP       Current Outpatient Medications  Medication Sig Dispense Refill  . Amino Acids-Protein Hydrolys (FEEDING SUPPLEMENT, PRO-STAT SUGAR FREE 64,) LIQD Take 30 mLs by mouth 2 (two) times daily. 60 Bottle 0  . amLODipine (NORVASC) 5 MG tablet Take 1 tablet (5 mg total) by mouth at bedtime. 30 tablet 0  . B Complex-C-Zn-Folic Acid (DIALYVITE 161-WRUE 15) 0.8 MG TABS Take 1 tablet by mouth daily.    . Blood Glucose Monitoring Suppl (ACCU-CHEK AVIVA) device Use as instructed 1 each 0  . calcitRIOL (ROCALTROL) 0.25 MCG capsule Take 1 capsule (0.25 mcg total) by mouth every Monday, Wednesday, and Friday with hemodialysis. 15 capsule 0  . glucose blood (ACCU-CHEK AVIVA) test strip Use twice per day. 100 each 0  . insulin aspart (NOVOLOG) 100 UNIT/ML injection Inject 5 Units into the skin 3 (three) times daily with meals. (Patient taking differently: Inject 3 Units into the skin 3 (three) times daily with meals. ) 10 mL 0   . insulin glargine (LANTUS) 100 UNIT/ML injection Inject 0.03 mLs (3 Units total) into the skin at bedtime. (Patient taking differently: Inject 5 Units into the skin at bedtime. ) 10 mL 0  . Insulin Syringe-Needle U-100 (INSULIN SYRINGE .3CC/31GX5/16") 31G X 5/16" 0.3 ML MISC Inject 1 each into the skin 4 (four) times daily. 120 each 5  . Lancets (ACCU-CHEK SOFT TOUCH) lancets Use twice per day. 100 each 0  . multivitamin (RENA-VIT) TABS tablet Take 1 tablet by mouth at bedtime. 30 each 0  . pantoprazole (PROTONIX) 40 MG tablet Take 1 tablet (40 mg total) by mouth daily. 30 tablet 0  . sevelamer carbonate (RENVELA) 800 MG tablet Take 2 tablets (1,600 mg total) by mouth 3 (three) times daily with meals. 90 tablet 0   Labs: Basic Metabolic Panel: Recent Labs  Lab 12/03/18 1208  NA 138  K 5.4*  CL 105  CO2 18*  GLUCOSE 120*  BUN 83*  CREATININE 13.17*  CALCIUM 8.8*   Liver Function Tests: Recent Labs  Lab 12/03/18 1208  AST 43*  ALT 21  ALKPHOS 63  BILITOT 0.9  PROT 7.4  ALBUMIN 2.6*   Recent Labs  Lab 12/03/18 1208  LIPASE 33   CBC: Recent Labs  Lab 12/03/18 1208  WBC 14.1*  NEUTROABS 10.8*  HGB 10.7*  HCT 36.2  MCV 81.3  PLT 235   Cardiac Enzymes: Recent Labs  Lab 12/03/18 1223  TROPONINI 0.18*   CBG: No results for input(s): GLUCAP in the last 168 hours. Iron Studies: No results for input(s): IRON, TIBC, TRANSFERRIN, FERRITIN in the last 72 hours. Studies/Results: Dg Chest Port 1 View  Result Date: 12/03/2018 CLINICAL DATA:  Shortness of breath. EXAM: PORTABLE CHEST 1 VIEW COMPARISON:  Radiograph of November 25, 2018. FINDINGS: Stable cardiomegaly. Right internal jugular dialysis catheter is noted. No pneumothorax is noted. Increased bilateral diffuse lung opacities are noted concerning for pneumonia or possibly edema, right greater than left. Small right pleural effusion may be present. Bony thorax is unremarkable. IMPRESSION: Significantly increased  bilateral lung opacities are noted, right greater than left, concerning for pneumonia or possibly edema. Small right pleural effusion is noted. Electronically Signed   By: Marijo Conception, M.D.   On: 12/03/2018 12:58    ROS: As per HPI otherwise negative.  Physical Exam: Vitals:   12/03/18 1230 12/03/18 1245 12/03/18 1300 12/03/18 1344  BP: Marland Kitchen)  174/106 (!) 180/124 (!) 174/112   Pulse: (!) 114     Resp: (!) 27 (!) 34 (!) 35   Temp:    (!) 100.5 F (38.1 C)  TempSrc:    Oral  SpO2: 98%     Weight:         General:  On O2  facial mask acutely ill appearing Full exam deferred due to droplet precautions. Discussed plan of care with Dr. Lamonte Sakai Dialysis Access: left upper AVGG and right IJ [facility]  Dialysis Orders:  [place], TueThuSat, 4 hrs 0 min, 180NRe Optiflux, BFR 400, DFR Manual 800 mL/min, EDW 58.5 (kg), Dialysate 2.0 K, 2.5 Ca, 1.0 Mg, 100 Dextrose (G2251), Sodium 137 (mEq/L), Bicarb Setting: 35 (mEq/L), UFR Profile: None, Sodium Model: None, Access: CVCatheter-Tunneled, and left AVGG placed 2/17 Hectorol 4, Mircera 100 w 2 week - did get 150 3/21 - last hgb 10.9 3/21  Assessment/Plan: 1. Acute hypoxic respiratory failure with fever/hemoptysis - influenza neg, droplet precautions - pulmonary edema a component with possible PNA - work up and abtx per pulmonary - separate HD in ICU today - no heparin due to hemoptysis- though she has SQ heparin ordered / For CT abd/pelvis due to abdominal pain. Abtx per primary 2. ESRD -  TTS - off schedule - last outpatient was 3/21 for 2 hr - HD today as separate-should be able to use AVGG over month old - if works ok, need to get [facility] out this admission. Follow daily - may need an extra treatment tomorrow 3. Hypertension/volume  - should improve with lowering of volume 4. Anemia  - stable - not due for ESA 5. Metabolic bone disease -  Continue hectorol - start Thursday - likely will have another treatment tomorrow, has been on renvela 2 ac with 2.5 bath -  corrected Ca doesn't warranted 2.5 at this time. 6. Nutrition - npo 7. Substance abuse - cocaine - apparently relapse this month per primary notes 8. Hx BOOP and ANCA related vasculitis  Myriam Jacobson, PA-C [facility] [phone] 12/03/2018, 3:07 PM     I did not see or examine this patient due to concerns over covid-19 and relied upon the physical exam performed by the PCCM team.  Agree with plan and assessment in the above note with renal recommendations/intervention highlighted.  Will plan for HD today in isolation room. Governor Rooks Neema Barreira,MD 12/03/2018 3:56 PM

## 2018-12-03 NOTE — Procedures (Signed)
Endotracheal Intubation Procedure Note  Indication for endotracheal intubation: impending respiratory failure and respiratory failure. Airway Assessment: Mallampati Class: IV (only hard palate visible). Sedation: etomidate and midazolam. Paralytic: rocuronium. Lidocaine: no. Atropine: no. Equipment: GlideScope & Macintosh 4 laryngoscope blade and 7.51mm cuffed endotracheal tube.  24 cm at teeth. Cricoid Pressure: no. Number of attempts: 1. ETT location confirmed by by auscultation, by CXR and ETCO2 monitor.  Renee Pain, MD Board Certified by the ABIM, Pulmonary Diseases & Critical Care Medicine 12/03/2018

## 2018-12-03 NOTE — Progress Notes (Signed)
At shift change pt assessed on Bipap with spO2 100%, RR 20s, HR 110's pt sleeping but arousable receiving HD. Around 2100 pt became tachycardic with HR 140-150s, RR 50-60 and spO2 89%. E-link Md notified, RT notified and to bedside for Abg. CCM to bedside and pt intubated with 2mg  versed, 20mg  Etomidate, 50mg  Roc. Pt tolerated well but HR continued to be elevated even after intubation as high as 168. Propofol started and more orders for sedation given. Pt BP stable throughout event. Pt transferred to negative pressure room for Covid rule out and report given to another RN.

## 2018-12-03 NOTE — Significant Event (Addendum)
CTSP re: increased dyspnea and failure to improve on HD and BiPAP support. Nurse reports that the patient has continued to be febrile. Tachycardic and hypertensive. Patient is agreeable to endotracheal intubation.  Chart reviewed. ESRD and ANCA vasculitis (not on immunosuppressive treatment). Febrile, progressively worsening dyspnea, abnormal CXR. Respiratory viral panel PENDING.  Intubated.  Case presented to ID (Dr. Johnnye Sima), who agrees that SARS-CoV-2 testing is warranted.  Renee Pain, MD Board Certified by the ABIM, [place]

## 2018-12-03 NOTE — ED Provider Notes (Signed)
[facility] EMERGENCY DEPARTMENT Provider Note   CSN: [id number] Arrival date & time: 12/03/18  1141    History   Chief Complaint No chief complaint on file.   HPI Rhonda Lynch is a 63 y.o. female.     HPI   Patient is a 63 year old female with a history of ESRD TTS, type 2 diabetes mellitus, hypertension, CVA, cocaine use presenting for abdominal pain, nausea, vomiting and diarrhea.  She reports pain is severe and intolerable throughout her entire abdomen.  On further evaluation of the patient, she states that she is also more short of breath.  She missed dialysis yesterday due to her episodes of diarrhea.  She denies any melena, but does report that she is seeing some blood in the stool.  Denies any hematemesis.  She reports that she has felt febrile but has not checked her temperature.  Denies chest pain.  Patient denies knowing any exposures to anyone positive for COVID-19.   On further questioning of social situation, patient states that she is currently living with a friend.  She presents in scrubs that were given to her in the hospital when she was last discharged one week ago.  Past Medical History:  Diagnosis Date   Diabetes mellitus without complication ([place])    ESRD (end stage renal disease) on dialysis ([place])    "TTS; [facility]; they're moving me to another one" (09/22/2018)   Hepatitis C    diagnosed 09/2018   Hypertension    Oxygen deficiency    2 L at night   Stroke [facility])    Substance abuse ([place])    has been clean for 4 months     Patient Active Problem List   Diagnosis Date Noted   Hyperkalemia 11/25/2018   Cocaine overdose, accidental or unintentional, initial encounter ([place]) 11/25/2018   CAP (community acquired pneumonia) 11/17/2018   End-stage renal disease on hemodialysis ([place])    Hypoglycemia 10/23/2018   Non-intractable vomiting    Other ascites    Palliative care encounter    Left arm pain    Chronic  hepatitis C virus genotype 1b infection ([place]) 09/28/2018   ICH (intracerebral hemorrhage) ([place]) small left caudate due to HTN 07/17/2018   HCAP (healthcare-associated pneumonia) 06/21/2018   Substance induced mood disorder ([place]) 05/15/2018   Agitation    Hemodialysis catheter infection, initial encounter ([place])    Fungemia 04/27/2018   Pulmonary embolism ([place]) 04/27/2018   Sepsis ([place]) 04/26/2018   Dyspnea 04/26/2018   Chest pain 04/26/2018   Tachycardia 04/26/2018   Abdominal pain 04/18/2018   Type 2 diabetes mellitus ([place]) 04/18/2018   Right flank pain 04/18/2018   Acute respiratory failure with hypoxia ([place]) 04/12/2018   SVT (supraventricular tachycardia) (HCC)    Chronic renal failure    Hypertension    Non-compliance with renal dialysis ([place])    Acute pulmonary edema (HCC)    Elevated troponin 03/19/2018   Fever    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CC)    Diffuse pulmonary alveolar hemorrhage    Hypoxemia    Pulmonary infiltrate    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[place])    Acute respiratory failure ([place]) 02/12/2018   Alcohol abuse 02/12/2018   Microcytic anemia 02/12/2018   Cocaine abuse ([place]) 02/12/2018   Epigastric pain 02/12/2018   Pneumonia 02/12/2018   TOBACCO ABUSE 03/08/2010   PNEUMONIA 03/08/2010    Past Surgical History:  Procedure Laterality Date   AV FISTULA PLACEMENT Left 03/05/2018   Procedure: ARTERIOVENOUS (  AV) FISTULA CREATION  LEFT UPPER EXTREMITY;  Surgeon: [name], MD;  Location: [facility];  Service: Vascular;  Laterality: Left;   AV FISTULA PLACEMENT Left 07/21/2018   Procedure: ARTERIOVENOUS (AV) FISTULA CREATION;  Surgeon: Marty Heck, MD;  Location: [facility];  Service: Vascular;  Laterality: Left;   AV FISTULA PLACEMENT Left 10/27/2018   Procedure: INSERTION OF ARTERIOVENOUS (AV) GORE-TEX GRAFT UPPER ARM;  Surgeon: Marty Heck, MD;  Location: [facility];  Service: Vascular;   Laterality: Left;   [place] Left 09/29/2018   Procedure: BASILIC VEIN TRANSPOSITION SECOND STAGE LEFT UPPER ARM;  Surgeon: Marty Heck, MD;  Location: [facility];  Service: Vascular;  Laterality: Left;   HEMATOMA EVACUATION Left 03/06/2018   Procedure: EVACUATION HEMATOMA LEFT ARM;  Surgeon: Angelia Mould, MD;  Location: [facility];  Service: Vascular;  Laterality: Left;   I&D EXTREMITY Left 10/06/2018   Procedure: IRRIGATION AND DEBRIDEMENT ARM;  Surgeon: Marty Heck, MD;  Location: [facility];  Service: Vascular;  Laterality: Left;   INSERTION OF DIALYSIS CATHETER Left 04/29/2018   Procedure: INSERTION OF DIALYSIS CATHETER LEFT INTERNAL JUGULAR;  Surgeon: Angelia Mould, MD;  Location: [facility];  Service: Vascular;  Laterality: Left;   IR FLUORO GUIDE CV LINE RIGHT  02/18/2018   IR FLUORO GUIDE CV LINE RIGHT  02/26/2018   IR REMOVAL TUN CV CATH W/O FL  04/27/2018   IR US GUIDE VASC ACCESS RIGHT  02/18/2018   TEE WITHOUT CARDIOVERSION N/A 04/29/2018   Procedure: TRANSESOPHAGEAL ECHOCARDIOGRAM (TEE);  Surgeon: Sueanne Margarita, MD;  Location[facility];  Service: Cardiovascular;  Laterality: N/A;   VENOGRAM Left 10/27/2018   Procedure: LEFT CENTRAL VENOGRAM;  Surgeon: Marty Heck, MD;  Location: [facility];  Service: Vascular;  Laterality: Left;     OB History   No obstetric history on file.      Home Medications    Prior to Admission medications   Medication Sig Start Date End Date Taking? Authorizing Provider  Amino Acids-Protein Hydrolys (FEEDING SUPPLEMENT, PRO-STAT SUGAR FREE 64,) LIQD Take 30 mLs by mouth 2 (two) times daily. 11/21/18   Ghimire, Henreitta Leber, MD  amLODipine (NORVASC) 5 MG tablet Take 1 tablet (5 mg total) by mouth at bedtime. 10/13/18   Alphonzo Grieve, MD  B Complex-C-Zn-Folic Acid (DIALYVITE 809-XIPJ 15) 0.8 MG TABS Take 1 tablet by mouth daily. 11/04/18   [provider]  Blood Glucose Monitoring Suppl (ACCU-CHEK  AVIVA) device Use as instructed 10/29/18 10/29/19  Isabelle Course, MD  calcitRIOL (ROCALTROL) 0.25 MCG capsule Take 1 capsule (0.25 mcg total) by mouth every Monday, Wednesday, and Friday with hemodialysis. 10/29/18   Isabelle Course, MD  glucose blood (ACCU-CHEK AVIVA) test strip Use twice per day. 10/29/18   Isabelle Course, MD  insulin aspart (NOVOLOG) 100 UNIT/ML injection Inject 5 Units into the skin 3 (three) times daily with meals. Patient taking differently: Inject 3 Units into the skin 3 (three) times daily with meals.  10/13/18   Alphonzo Grieve, MD  insulin glargine (LANTUS) 100 UNIT/ML injection Inject 0.03 mLs (3 Units total) into the skin at bedtime. Patient taking differently: Inject 5 Units into the skin at bedtime.  10/29/18   Isabelle Course, MD  Insulin Syringe-Needle U-100 (INSULIN SYRINGE .3CC/31GX5/16") 31G X 5/16" 0.3 ML MISC Inject 1 each into the skin 4 (four) times daily. 06/27/18   Clent Demark, PA-C  Lancets [facility]) lancets Use  twice per day. 10/29/18   Isabelle Course, MD  multivitamin (RENA-VIT) TABS tablet Take 1 tablet by mouth at bedtime. 10/13/18   Alphonzo Grieve, MD  pantoprazole (PROTONIX) 40 MG tablet Take 1 tablet (40 mg total) by mouth daily. 11/21/18   Ghimire, Henreitta Leber, MD  sevelamer carbonate (RENVELA) 800 MG tablet Take 2 tablets (1,600 mg total) by mouth 3 (three) times daily with meals. 11/26/18   Bonnell Public, MD    Family History Family History  Problem Relation Age of Onset   Autoimmune disease Neg Hx     Social History Social History   Tobacco Use   Smoking status: Current Every Day Smoker    Packs/day: 0.50    Years: 48.00    Pack years: 24.00    Types: Cigarettes   Smokeless tobacco: Never Used   Tobacco comment: trying to quit ; smoking 1 1/2 cigarettes/day  (09/22/2018)  Substance Use Topics   Alcohol use: Not Currently    Comment: 09/22/2018 "stopped about 6 months ago"   Drug use: Yes    Types: Cocaine, Heroin     Comment: See notes     Allergies   Acetaminophen and Hydroxyzine   Review of Systems Review of Systems  Constitutional: Positive for chills and fever.  HENT: Negative for congestion and sore throat.   Eyes: Negative for visual disturbance.  Respiratory: Positive for cough and shortness of breath. Negative for chest tightness.   Cardiovascular: Negative for chest pain, palpitations and leg swelling.  Gastrointestinal: Positive for abdominal pain, diarrhea, nausea and vomiting.  Genitourinary: Negative for dysuria and flank pain.  Musculoskeletal: Negative for back pain and myalgias.  Skin: Negative for rash.  Neurological: Negative for dizziness, syncope, light-headedness and headaches.     Physical Exam Updated Vital Signs BP (!) 174/112    Pulse (!) 114    Resp (!) 35    Wt 55 kg    SpO2 98%    BMI 22.91 kg/m   Physical Exam Vitals signs and nursing note reviewed.  Constitutional:      General: She is in acute distress.     Appearance: She is well-developed. She is ill-appearing and diaphoretic.     Comments: Pt tachypneic and appears uncomfortable.  HENT:     Head: Normocephalic and atraumatic.  Eyes:     General:        Right eye: No discharge.        Left eye: No discharge.     Extraocular Movements: Extraocular movements intact.     Conjunctiva/sclera: Conjunctivae normal.     Pupils: Pupils are equal, round, and reactive to light.  Neck:     Musculoskeletal: Normal range of motion and neck supple.  Cardiovascular:     Rate and Rhythm: Regular rhythm. Tachycardia present.     Heart sounds: S1 normal and S2 normal. No murmur.  Pulmonary:     Breath sounds: Rhonchi and rales present.     Comments: Tachypnea and abdominal retractions noted.  Diminished breath sounds throughout, diffuse rales in all lung fields. Abdominal:     General: Bowel sounds are normal. There is no distension.     Palpations: Abdomen is soft.     Tenderness: There is abdominal  tenderness. There is no guarding.     Comments: Patient has generalized abdominal tenderness without guarding or rebound.  Musculoskeletal: Normal range of motion.        General: No deformity.  Lymphadenopathy:  Cervical: No cervical adenopathy.  Skin:    General: Skin is warm.     Findings: No erythema or rash.  Neurological:     Mental Status: She is alert.     Comments: Cranial nerves grossly intact. Patient moves extremities symmetrically and with good coordination.  Psychiatric:        Behavior: Behavior normal.        Thought Content: Thought content normal.        Judgment: Judgment normal.      ED Treatments / Results  Labs (all labs ordered are listed, but only abnormal results are displayed) Labs Reviewed  LACTIC ACID, PLASMA - Abnormal; Notable for the following components:      Result Value   Lactic Acid, Venous 2.4 (*)    All other components within normal limits  COMPREHENSIVE METABOLIC PANEL - Abnormal; Notable for the following components:   Potassium 5.4 (*)    CO2 18 (*)    Glucose, Bld 120 (*)    BUN 83 (*)    Creatinine, Ser 13.17 (*)    Calcium 8.8 (*)    Albumin 2.6 (*)    AST 43 (*)    GFR calc non Af Amer 3 (*)    GFR calc Af Amer 3 (*)    All other components within normal limits  CBC WITH DIFFERENTIAL/PLATELET - Abnormal; Notable for the following components:   WBC 14.1 (*)    Hemoglobin 10.7 (*)    MCH 24.0 (*)    MCHC 29.6 (*)    RDW 21.8 (*)    Neutro Abs 10.8 (*)    All other components within normal limits  URINALYSIS, ROUTINE W REFLEX MICROSCOPIC - Abnormal; Notable for the following components:   APPearance HAZY (*)    Glucose, UA 100 (*)    Hgb urine dipstick LARGE (*)    Protein, ur >300 (*)    Leukocytes,Ua TRACE (*)    All other components within normal limits  BRAIN NATRIURETIC PEPTIDE - Abnormal; Notable for the following components:   B Natriuretic Peptide >4,500.0 (*)    All other components within normal limits    TROPONIN I - Abnormal; Notable for the following components:   Troponin I 0.18 (*)    All other components within normal limits  RAPID URINE DRUG SCREEN, HOSP PERFORMED - Abnormal; Notable for the following components:   Cocaine POSITIVE (*)    All other components within normal limits  URINALYSIS, MICROSCOPIC (REFLEX) - Abnormal; Notable for the following components:   Bacteria, UA MANY (*)    Trichomonas, UA PRESENT (*)    All other components within normal limits  CULTURE, BLOOD (ROUTINE X 2)  CULTURE, BLOOD (ROUTINE X 2)  URINE CULTURE  RESPIRATORY PANEL BY PCR  LIPASE, BLOOD  INFLUENZA PANEL BY PCR (TYPE A & B)  LACTIC ACID, PLASMA  RAPID URINE DRUG SCREEN, HOSP PERFORMED    EKG EKG Interpretation  Date/Time:  Wednesday December 03 2018 11:44:23 EDT Ventricular Rate:  123 PR Interval:    QRS Duration: 76 QT Interval:  316 QTC Calculation: 452 R Axis:   -55 Text Interpretation:  Sinus tachycardia Left anterior fascicular block Consider left ventricular hypertrophy Confirmed by Davonna Belling [phone]) on 12/03/2018 3:22:51 PM   Radiology Dg Chest Port 1 View  Result Date: 12/03/2018 CLINICAL DATA:  Shortness of breath. EXAM: PORTABLE CHEST 1 VIEW COMPARISON:  Radiograph of November 25, 2018. FINDINGS: Stable cardiomegaly. Right internal jugular dialysis catheter is noted.  No pneumothorax is noted. Increased bilateral diffuse lung opacities are noted concerning for pneumonia or possibly edema, right greater than left. Small right pleural effusion may be present. Bony thorax is unremarkable. IMPRESSION: Significantly increased bilateral lung opacities are noted, right greater than left, concerning for pneumonia or possibly edema. Small right pleural effusion is noted. Electronically Signed   By: Marijo Conception, M.D.   On: 12/03/2018 12:58    Procedures Procedures (including critical care time)  CRITICAL CARE Performed by: Albesa Seen   Total critical care time: 35  minutes  Critical care time was exclusive of separately billable procedures and treating other patients.  Critical care was necessary to treat or prevent imminent or life-threatening deterioration.  Critical care was time spent personally by me on the following activities: development of treatment plan with patient and/or surrogate as well as nursing, discussions with consultants, evaluation of patient's response to treatment, examination of patient, obtaining history from patient or surrogate, ordering and performing treatments and interventions, ordering and review of laboratory studies, ordering and review of radiographic studies, pulse oximetry and re-evaluation of patient's condition.   Medications Ordered in ED Medications  ceFEPIme (MAXIPIME) 1 g in sodium chloride 0.9 % 100 mL IVPB (has no administration in time range)  vancomycin variable dose per unstable renal function (pharmacist dosing) (has no administration in time range)  vancomycin (VANCOCIN) IVPB 1000 mg/200 mL premix (1,000 mg Intravenous New Bag/Given 12/03/18 1331)  metroNIDAZOLE (FLAGYL) IVPB 500 mg (has no administration in time range)  fentaNYL (SUBLIMAZE) injection 25 mcg (25 mcg Intravenous Given 12/03/18 1201)  HYDROmorphone (DILAUDID) injection 0.25 mg (0.25 mg Intravenous Given 12/03/18 1326)     Initial Impression / Assessment and Plan / ED Course  I have reviewed the triage vital signs and the nursing notes.  Pertinent labs & imaging results that were available during my care of the patient were reviewed by me and considered in my medical decision making (see chart for details).  Clinical Course as of Dec 02 1524  Wed Dec 03, 2018  1221 Pt febrile to 100.5. Will make Code Sepsis.    [AM]  1248 WBC(!): 14.1 [AM]  1248 Pulmonary edema versus worsening infiltrates. Will obtain CT C/A/P with CM.   DG Chest Port 1 View [AM]  1350 Troponin I(!!): 0.18 [AM]  1350 Lactic Acid, Venous(!!): 2.4 [AM]  1351 Likely  demand in the setting of acute illness.   Troponin I(!!): 0.18 [AM]  5284 Spoke with Dr.  Lynetta Mare of PCCM who will evaluate the patient via chart and call back with recommendations for COVID testing and further management.    [AM]  1453 Nephrology at bedside to evaluate patient.    [AM]  1453 Pt is covered for UTI with broad spectrum abx.   Bacteria, UA(!): MANY [AM]  1454 No peaked Ts. Pt is likely going for urgent dialysis.   Potassium(!): 5.4 [AM]    Clinical Course User Index [AM] Albesa Seen, PA-C       Patient is critically ill and requiring a higher level of care. Sepsis suspected. Code sepsis called. Patient meeting SIRS criteria based on tachycardia, fever. See vitals below. Suspected source of infection intra-abdominal or pulmonary based on exam, recent pneumonia, and CXR.  Given generalized abdominal pain, differential diagnosis includes appendicitis, ischemic bowel, cholecystitis, lactic acid 2.4. Signs of end organ dysfunction include lactic acidosis. Pt has ESRD at baseline. No elevation in bilirubin.  Patient also rapidly becoming hypoxic without oxygen.  She removed her nasal cannula and she de-saturated, so was placed on non-rebreather.  Patient had good clinical response and oxygen saturation was 98% on nonrebreather.  Tachypnea improved.  Vitals:   12/03/18 1345 12/03/18 1400 12/03/18 1415 12/03/18 1500  BP: (!) 172/99 (!) 167/96 (!) 163/98 (!) 178/103  Pulse:      Resp: (!) 32 (!) 28 (!) 31 (!) 35  Temp:      TempSrc:      SpO2:      Weight:        Broad spectrum antibiotics initiated based on suspected source of infection, and given intra-abdominal as well as pulmonary on the differential, initiated vancomycin, cefepime, and Flagyl.   Demonstrating leukocytosis of 14.1 with left shift.  Hemoglobin stable at 10.7.  Troponin elevated at 0.18.  This is likely demand ischemia in the setting of acute illness.  Low suspicion for ACS at this time.  EKG is in sinus  tachycardia but no ischemic findings.  CMP demonstrated potassium of 5.4 without EKG changes.  CO2 18, and in the setting of creatinine of 13.17 and BUN of 83, consistent with patient not attending dialysis.  Patient does have a lactic acidosis of 2.4.  Urinalysis does have some concerning features for infection including trace leukocytes and many bacteria.  She is positive for cocaine.  She also has trichomonas incidentally found the urine.    Given patient's hypoxic respiratory failure, as well as need for urgent dialysis, critical care was consulted.  They will admit. Nephrology also involved to urgently dialyze patient.  Appreciate the involvement of these teams. CT chest/abdomen/pelvis pending at time of admission.  COVID-19 was considered in this patient, however precautions removed by PCCM after evaluating patient and agree COVID-19 less likely.  Influenza testing is negative.  Patient initially evaluated under contact/droplet precautions.   This is a shared visit with Dr. Davonna Belling. Patient was independently evaluated by this attending physician. Attending physician consulted in evaluation and management.  Final Clinical Impressions(s) / ED Diagnoses   Final diagnoses:  Acute respiratory failure with hypoxia (HCC)  Sepsis, due to unspecified organism, unspecified whether acute organ dysfunction present [facility])  Generalized abdominal pain    ED Discharge Orders    None       Tamala Julian 12/03/18 1549    Davonna Belling, MD 12/04/18 1433

## 2018-12-03 NOTE — ED Triage Notes (Signed)
Pt arrives w abdominal pain, SOB and productive cough, dc/d 3/17 for PNA. Last dialysis treatment Saturday  Denies fever, contact with COVID pt and/or travel.

## 2018-12-04 ENCOUNTER — Inpatient Hospital Stay (HOSPITAL_COMMUNITY): Payer: Medicare Other

## 2018-12-04 LAB — POCT I-STAT 7, (LYTES, BLD GAS, ICA,H+H)
Acid-Base Excess: 3 mmol/L — ABNORMAL HIGH (ref 0.0–2.0)
Bicarbonate: 28.5 mmol/L — ABNORMAL HIGH (ref 20.0–28.0)
Calcium, Ion: 1.09 mmol/L — ABNORMAL LOW (ref 1.15–1.40)
HCT: 24 % — ABNORMAL LOW (ref 36.0–46.0)
Hemoglobin: 8.2 g/dL — ABNORMAL LOW (ref 12.0–15.0)
O2 Saturation: 99 %
POTASSIUM: 4.3 mmol/L (ref 3.5–5.1)
Sodium: 136 mmol/L (ref 135–145)
TCO2: 30 mmol/L (ref 22–32)
pCO2 arterial: 45 mmHg (ref 32.0–48.0)
pH, Arterial: 7.409 (ref 7.350–7.450)
pO2, Arterial: 142 mmHg — ABNORMAL HIGH (ref 83.0–108.0)

## 2018-12-04 LAB — RESPIRATORY PANEL BY PCR
Adenovirus: NOT DETECTED
Bordetella pertussis: NOT DETECTED
CORONAVIRUS HKU1-RVPPCR: NOT DETECTED
CORONAVIRUS NL63-RVPPCR: NOT DETECTED
Chlamydophila pneumoniae: NOT DETECTED
Coronavirus 229E: NOT DETECTED
Coronavirus OC43: NOT DETECTED
Influenza A: NOT DETECTED
Influenza B: NOT DETECTED
Metapneumovirus: NOT DETECTED
Mycoplasma pneumoniae: NOT DETECTED
Parainfluenza Virus 1: NOT DETECTED
Parainfluenza Virus 2: NOT DETECTED
Parainfluenza Virus 3: NOT DETECTED
Parainfluenza Virus 4: NOT DETECTED
Respiratory Syncytial Virus: NOT DETECTED
Rhinovirus / Enterovirus: NOT DETECTED

## 2018-12-04 LAB — POCT ACTIVATED CLOTTING TIME: Activated Clotting Time: 0 seconds

## 2018-12-04 LAB — CBC
HCT: 25 % — ABNORMAL LOW (ref 36.0–46.0)
HEMOGLOBIN: 7.8 g/dL — AB (ref 12.0–15.0)
MCH: 24.8 pg — ABNORMAL LOW (ref 26.0–34.0)
MCHC: 31.2 g/dL (ref 30.0–36.0)
MCV: 79.4 fL — ABNORMAL LOW (ref 80.0–100.0)
Platelets: 150 10*3/uL (ref 150–400)
RBC: 3.15 MIL/uL — AB (ref 3.87–5.11)
RDW: 20.7 % — ABNORMAL HIGH (ref 11.5–15.5)
WBC: 11 10*3/uL — ABNORMAL HIGH (ref 4.0–10.5)
nRBC: 0.2 % (ref 0.0–0.2)

## 2018-12-04 LAB — GLUCOSE, CAPILLARY
Glucose-Capillary: 120 mg/dL — ABNORMAL HIGH (ref 70–99)
Glucose-Capillary: 133 mg/dL — ABNORMAL HIGH (ref 70–99)
Glucose-Capillary: 109 mg/dL — ABNORMAL HIGH (ref 70–99)
Glucose-Capillary: 108 mg/dL — ABNORMAL HIGH (ref 70–99)
Glucose-Capillary: 125 mg/dL — ABNORMAL HIGH (ref 70–99)
Glucose-Capillary: 94 mg/dL (ref 70–99)

## 2018-12-04 LAB — COMPREHENSIVE METABOLIC PANEL
ALT: 16 U/L (ref 0–44)
AST: 32 U/L (ref 15–41)
Albumin: 2 g/dL — ABNORMAL LOW (ref 3.5–5.0)
Alkaline Phosphatase: 53 U/L (ref 38–126)
Anion gap: 12 (ref 5–15)
BUN: 27 mg/dL — ABNORMAL HIGH (ref 8–23)
CALCIUM: 7.9 mg/dL — AB (ref 8.9–10.3)
CHLORIDE: 102 mmol/L (ref 98–111)
CO2: 23 mmol/L (ref 22–32)
CREATININE: 6.54 mg/dL — AB (ref 0.44–1.00)
GFR calc Af Amer: 7 mL/min — ABNORMAL LOW (ref >60)
GFR calc non Af Amer: 6 mL/min — ABNORMAL LOW (ref >60)
Glucose, Bld: 129 mg/dL — ABNORMAL HIGH (ref 70–99)
Potassium: 4.2 mmol/L (ref 3.5–5.1)
Sodium: 137 mmol/L (ref 135–145)
Total Bilirubin: 0.8 mg/dL (ref 0.3–1.2)
Total Protein: 5.6 g/dL — ABNORMAL LOW (ref 6.5–8.1)

## 2018-12-04 LAB — PATHOLOGIST SMEAR REVIEW

## 2018-12-04 LAB — TRIGLYCERIDES: Triglycerides: 75 mg/dL (ref <150)

## 2018-12-04 MED ORDER — INSULIN ASPART 100 UNIT/ML ~~LOC~~ SOLN
0.0000 [IU] | SUBCUTANEOUS | Status: DC
  Administered 2018-12-04 – 2018-12-07 (×3): 1 [IU] via SUBCUTANEOUS

## 2018-12-04 MED ORDER — NEPRO/CARBSTEADY PO LIQD
1000.0000 mL | ORAL | Status: DC
  Administered 2018-12-04: 1000 mL
  Filled 2018-12-04 (×2): qty 1000

## 2018-12-04 MED ORDER — PIPERACILLIN-TAZOBACTAM 3.375 G IVPB
3.3750 g | Freq: Two times a day (BID) | INTRAVENOUS | Status: DC
  Administered 2018-12-04 – 2018-12-09 (×10): 3.375 g via INTRAVENOUS
  Filled 2018-12-04 (×12): qty 50

## 2018-12-04 MED ORDER — MIDODRINE HCL 5 MG PO TABS
10.0000 mg | ORAL_TABLET | Freq: Three times a day (TID) | ORAL | Status: DC
  Administered 2018-12-04 – 2018-12-05 (×5): 10 mg
  Filled 2018-12-04 (×13): qty 2

## 2018-12-04 MED ORDER — METRONIDAZOLE 50 MG/ML ORAL SUSPENSION
2000.0000 mg | Freq: Once | ORAL | Status: AC
  Administered 2018-12-04: 2000 mg
  Filled 2018-12-04: qty 40

## 2018-12-04 MED ORDER — NEPRO/CARBSTEADY PO LIQD
1000.0000 mL | ORAL | Status: DC
  Administered 2018-12-04 – 2018-12-05 (×2): 1000 mL
  Filled 2018-12-04 (×3): qty 1000

## 2018-12-04 MED ORDER — MIDODRINE HCL 5 MG PO TABS
10.0000 mg | ORAL_TABLET | Freq: Three times a day (TID) | ORAL | Status: DC
  Filled 2018-12-04: qty 2

## 2018-12-04 MED ORDER — PRO-STAT SUGAR FREE PO LIQD
30.0000 mL | Freq: Two times a day (BID) | ORAL | Status: DC
  Administered 2018-12-04 – 2018-12-06 (×5): 30 mL via ORAL
  Filled 2018-12-04 (×5): qty 30

## 2018-12-04 MED ORDER — DOXERCALCIFEROL 4 MCG/2ML IV SOLN
4.0000 ug | INTRAVENOUS | Status: DC
  Administered 2018-12-05: 4 ug via INTRAVENOUS
  Filled 2018-12-04 (×3): qty 2

## 2018-12-04 NOTE — Progress Notes (Addendum)
Initial Nutrition Assessment RD working remotely.  DOCUMENTATION CODES:   Not applicable  INTERVENTION:   Recommend advance TF to goal:  Nepro at 30 ml/h (720 ml per day)  Pro-stat 30 ml BID  Provides 1496 kcal, 88 gm protein, 523 ml free water daily  NUTRITION DIAGNOSIS:   Inadequate oral intake related to inability to eat as evidenced by NPO status.  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 Recent admissions 3/8-3/13 (pulmonary edema vs PNA) and 3/17-3/18 (AMS r/t cocaine overdose). R/O Covid-19, test pending.   Patient is currently intubated on ventilator support MV: 8 L/min Temp (24hrs), Avg:99.6 F (37.6 C), Min:97.8 F (36.6 C), Max:101.5 F (38.6 C)   Labs reviewed. BUN 27 (H), creatinine 6.54 (H), corrected calcium 9.5 CBG's: 133-109 Medications reviewed and include Precedex and Fentanyl.   Currently 1.6 kg above EDW (58.5 kg)  TF was initiated this morning: Nepro at 20 ml/h will provide 480 ml, 864 kcal, 39 gm protein, 349 ml free water daily.  NUTRITION - FOCUSED PHYSICAL EXAM:  unable to complete  Diet Order:   Diet Order            Diet NPO time specified  Diet effective now              EDUCATION NEEDS:   No education needs have been identified at this time  Skin:  Skin Assessment: Reviewed RN Assessment  Last BM:  none documented since admission  Height:   Ht Readings from Last 1 Encounters:  12/03/18 5\' 2"  (1.575 m)    Weight:   Wt Readings from Last 1 Encounters:  12/03/18 60.1 kg   EDW: 58.5 kg  Ideal Body Weight:  50 kg  BMI:  Body mass index is 24.23 kg/m.  Estimated Nutritional Needs:    Kcal:  1530  Protein:  80-95 gm  Fluid:  1 L + UOP     Molli Barrows, RD, LDN, [place] Pager [phone] After Hours Pager [phone]

## 2018-12-04 NOTE — Progress Notes (Signed)
NAME:  Rhonda Lynch, MRN:  [id number], DOB:  1956-05-17, LOS: 1 ADMISSION DATE:  12/03/2018, CONSULTATION DATE:  12/03/2018 REFERRING MD:  Dr. Alvino Chapel, CHIEF COMPLAINT:  SOB  Brief History   24 yoF ESRD TTS presenting with nausea, vomiting, abdominal pain, and slight SOB for ~ 3 days.  Last iHD -partial iHD on 3/21.  Found hypoxic requiring NRB, hypertensive, temp 100.5 rectal, with small amount of hematemesis in ER.  CXR c/w with bilateral infiltrates likely pulmonary edema.   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   Of note, patient recently admitted 3/8 -3/13 with bilateral lung infiltrates thought to be probable pulmonary edema vs pneumonia in the setting of missed dialysis , treated with iHD and antibiotics.  She was cocaine positive.  She was again admitted 3/17 - 3/18 with AMS thought related to overuse/ likely unintentional cocaine overdose.   Patient reports having partial iHD on 3/21.  Denies sick contacts, fever, known COVID -19 exposure, travel, or recent drug use.  She was found by EMS hypoxic with sats in the 80's.  In ER, she remained hypertensive, hypoxic, rectal temp 100.5. CXR showed bilateral infiltrates. Flu neg.  Labs noted for K 5.4, glucose 120, BUN 83, sCr 13.17, WBC 14.1, Hgb 10.7, Lactic 2.4, trop I 0.18 and BNP >4500, EKG non acute. 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  Received Plex, pulse dose steroids with prednisone taper and p.o. Cytoxan at that time.  She is no longer on immunosuppression.  Significant Hospital Events   3/8- 3/13 admit 3/17 -3/18 admit 3/25 Admit  Consults:  Nephrology  Procedures:   Significant Diagnostic Tests:  3/25 CT chest >>  3/25 CT abd/ pelvis >> 3/25 UDS >> positive for cocaine  Micro Data:  3/25 BCx 2 >> 3/25 UC >> 3/25 RVP >> negative 3/25 novel coronavirus >>   Antimicrobials:  3/25 vanc x1  3/25 flagyl x 1 3/25 cefepime x1 3/  Interim history/subjective:  Acute respiratory distress even after dialysis of 3.5 L removed.  Required intubation and mechanical ventilation evening 3/25 Possible COVID-19 postulated and testing performed, moved to negative pressure room   Objective   Blood pressure (!) 78/49, pulse 62, temperature 98.6 F (37 C), temperature source Oral, resp. rate 20, height 5\' 2"  (1.575 m), weight 60.1 kg, SpO2 100 %.    Vent Mode: PRVC FiO2 (%):  [60 %-100 %] 60 % Set Rate:  [20 bmp] 20 bmp Vt Set:  [400 mL] 400 mL PEEP:  [5 cmH20-8 cmH20] 8 cmH20 Plateau Pressure:  [19 cmH20-23 cmH20] 19 cmH20   Intake/Output Summary (Last 24 hours) at 12/04/2018 0813 Last data filed at 12/04/2018 0800 Gross per 24 hour  Intake 123.08 ml  Output 2622 ml  Net -2498.92 ml   Filed Weights   12/03/18 1143 12/03/18 1742 12/03/18 2130  Weight: 55 kg 64.2 kg 60.1 kg   Examination: Gen:  Ill-appearing woman, sedated, intubated Neuro: Wakes to voice and stimulation, follows commands, moves extremities, attempting to communicate HEENT: ET tube in place Neck: Right subclavian dialysis catheter in place Lungs: Coarse bilateral breath sounds, no wheezing CV: Regular, no murmur Abd: Soft, nondistended tender mid epigastric region to deep palpation, no rebound, no guarding, positive bowel sounds Ext: Trace lower extremity edema, 1+ upper extremity edema  Resolved Hospital Problem list     Assessment & Plan:  Acute hypoxic respiratory failure Bilateral infiltrates-consistent with acute pulmonary edema, cocaine injury. -Suspicion for infectious process either bacterial or viral is low P:  Continue mechanical ventilation.  Based on bilateral infiltrates favor transition to an ARDS protocol Given degree of her critical illness I think we should cover her empirically for possible HCAP although suspicion for infection low. There is been no history evidence or clinical evidence to support COVID-19.  Testing is been performed but clinically I will consider her to be low risk.  I think we need to continue the isolation and follow her testing to completion.  Reviewed the case with Dr. Johnnye Sima with ID. Follow chest x-ray with volume removal, supportive care  Hypertensive Urgency, resolved, now hypotensive on sedation - setting of ?cocaine abuse, non-compliance, and missed iHD P:  Hold her antihypertensive medications Avoid beta-blockers in setting of cocaine abuse Add midodrine 3/26   ESRD on TTS iHD Mild hyperkalemia P:  Appreciate nephrology assistance, hemodialysis completed 3/25 Removal as she can tolerate based on hemodynamics  DMT2  P:  Sliding-scale insulin sensitive scale Lantus on hold until nutrition initiated  Substance abuse Severe medical non-compliance  P:  Ongoing counseling Given this is her 3rd admit this month, consider PMT consult given ongoing noncompliance   N/V, small amount hematemesis at time of presentation - no diarrhea - CT abdomen without any abnormality P:  Okay to try to initiate tube feeding 3/26 PPI twice daily Hemoccult pending  Chronic Normocytic Anemia P:  Follow CBC Follow for any evidence of recurrent bleeding  Trichomonas, noted on UA P: Flagyl 2g per tube x 1 on 3/26   Best practice:  Diet: NPO Pain/Anxiety/Delirium protocol (if indicated): n/a VAP protocol (if indicated): n/a DVT prophylaxis: heparin SQ/ SCDs GI  prophylaxis: PPI BID Glucose control: SSI Mobility: BR Code Status: Full  Family Communication: Plan call family 3/26 Disposition: ICU  Labs   CBC: Recent Labs  Lab 12/03/18 1208 12/03/18 2146 12/03/18 2333 12/04/18 0451  WBC 14.1*  --   --  11.0*  NEUTROABS 10.8*  --   --   --   HGB 10.7* 11.6* 13.9 7.8*  HCT 36.2 34.0* 41.0 25.0*  MCV 81.3  --   --  79.4*  PLT 235  --   --  409    Basic Metabolic Panel: Recent Labs  Lab 12/03/18 1208 12/03/18 2146 12/03/18 2333 12/04/18 0310  NA 138 138 137 137  K 5.4* 3.5 3.6 4.2  CL 105  --   --  102  CO2 18*  --   --  23  GLUCOSE 120*  --   --  129*  BUN 83*  --   --  27*  CREATININE 13.17*  --   --  6.54*  CALCIUM 8.8*  --   --  7.9*   GFR: Estimated Creatinine Clearance: 6.8 mL/min (A) (by C-G formula based on SCr of 6.54 mg/dL (H)). Recent Labs  Lab 12/03/18 1208 12/03/18 1250 12/03/18 1519 12/04/18 0451  WBC 14.1*  --   --  11.0*  LATICACIDVEN  --  2.4* 2.3*  --     Liver Function Tests: Recent Labs  Lab 12/03/18 1208 12/04/18 0310  AST 43* 32  ALT 21 16  ALKPHOS 63 53  BILITOT 0.9 0.8  PROT 7.4 5.6*  ALBUMIN 2.6* 2.0*   Recent Labs  Lab 12/03/18 1208  LIPASE 33   No results for input(s): AMMONIA in the last 168 hours.  ABG    Component Value Date/Time   PHART 7.329 (L) 12/03/2018 2333   PCO2ART 52.6 (H) 12/03/2018 2333   PO2ART 340.0 (H) 12/03/2018 2333   HCO3 27.2 12/03/2018 2333   TCO2 29 12/03/2018 2333   ACIDBASEDEF 9.0 (H) 11/25/2018 0451   O2SAT 100.0 12/03/2018 2333     Coagulation Profile: No results for input(s): INR, PROTIME in the last 168 hours.  Cardiac Enzymes: Recent Labs  Lab 12/03/18 1223  TROPONINI 0.18*    HbA1C: Hgb A1c MFr Bld  Date/Time Value Ref Range Status  07/19/2018 05:34 AM 6.1 (H) 4.8 - 5.6 % Final    Comment:    (NOTE) Pre diabetes:          5.7%-6.4% Diabetes:              >6.4% Glycemic control for   <7.0% adults with diabetes   04/02/2018  02:45 PM 6.8 (H) 4.8 - 5.6 % Final    Comment:    (NOTE) Pre diabetes:          5.7%-6.4% Diabetes:              >6.4% Glycemic control for   <7.0% adults with diabetes     CBG: Recent Labs  Lab 12/03/18 1615 12/03/18 2342 12/04/18 0316  GLUCAP 115* 120* 133*     Critical care time: 35 mins    Baltazar Apo, MD, PhD 12/04/2018, 9:25 AM [facility] [phone] or if no answer [phone]

## 2018-12-04 NOTE — TOC Initial Note (Signed)
Transition of Care [facility]) - Initial/Assessment Note    Patient Details  Name: Rhonda Lynch MRN: [id number] Date of Birth: Sep 13, 1955  Transition of Care [facility]) CM/SW Contact:    Maryclare Labrador, RN Phone Number: 12/04/2018, 1:51 PM  Clinical Narrative:   PTA from home - pt had multiple admits (mostly due to non complaince with HD ).  Pt is a current substance abuser - CSW assessment requested.  Pt currently ventilated and therefore high risk assessment can not be performed at this time.                Expected Discharge Plan: Home/Self Care     Patient Goals and CMS Choice        Expected Discharge Plan and Services Expected Discharge Plan: Home/Self Care                                  Prior Living Arrangements/Services                       Activities of Daily Living      Permission Sought/Granted                  Emotional Assessment         Alcohol / Substance Use: Illicit Drugs Psych Involvement: No (comment)  Admission diagnosis:  Generalized abdominal pain [R10.84] Acute respiratory failure with hypoxia (HCC) [J96.01] Sepsis, due to unspecified organism, unspecified whether acute organ dysfunction present [facility]) [A41.9] Patient Active Problem List   Diagnosis Date Noted  . Hyperkalemia 11/25/2018  . Cocaine overdose, accidental or unintentional, initial encounter ([place]) 11/25/2018  . CAP (community acquired pneumonia) 11/17/2018  . End-stage renal disease on hemodialysis ([place])   . Hypoglycemia 10/23/2018  . Non-intractable vomiting   . Other ascites   . Palliative care encounter   . Left arm pain   . Chronic hepatitis C virus genotype 1b infection ([place]) 09/28/2018  . ICH (intracerebral hemorrhage) ([place]) small left caudate due to HTN 07/17/2018  . HCAP (healthcare-associated pneumonia) 06/21/2018  . Substance induced mood disorder ([place]) 05/15/2018  . Agitation   . Hemodialysis catheter infection, initial encounter ([place])   .  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 Pulmonary embolism ([place]) 04/27/2018  . Sepsis ([place]) 04/26/2018  . Dyspnea 04/26/2018  . Chest pain 04/26/2018  . Tachycardia 04/26/2018  . Abdominal pain 04/18/2018  . Type 2 diabetes mellitus ([place]) 04/18/2018  . Right flank pain 04/18/2018  . Acute respiratory failure with hypoxia ([place]) 04/12/2018  . SVT (supraventricular tachycardia) ([place])   . Chronic renal failure   . Hypertension   . Non-compliance with renal dialysis ([place])   . Acute pulmonary edema (HCC)   . Elevated troponin 03/19/2018  . Fever   . 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 Diffuse pulmonary alveolar hemorrhage   . Hypoxemia   . Pulmonary infiltrate   . 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[place])   . Acute respiratory failure ([place]) 02/12/2018  . Alcohol abuse 02/12/2018  . Microcytic anemia 02/12/2018  . Cocaine abuse ([place]) 02/12/2018  . Epigastric pain 02/12/2018  . Pneumonia 02/12/2018  . TOBACCO ABUSE 03/08/2010  . PNEUMONIA 03/08/2010   PCP:  Clent Demark, PA-C Pharmacy:   [place], [place] - [address] [place] [address] Phone: [phone] Fax: [phone]  [id number] - [place], [place] [facility]  [address] [place] [address] Phone: [phone] Fax: [place], [place] [address] [place] [address] Phone: [phone] Fax: [phone]  [id number] [place], [place] [address] Phone: [phone] Fax: [phone]     Social Determinants of Health (SDOH) Interventions    Readmission Risk Interventions No flowsheet data found.

## 2018-12-04 NOTE — Progress Notes (Signed)
Pharmacy Antibiotic Note  Rhonda Lynch is a 63 y.o. female admitted on 12/03/2018 with sepsis, requiring intubation, now with concern for pneumonia. Of note, patient has ESRD on HD as outpatient TTS with history of noncompliance. Patient also currently has COVID-19 test pending. Pharmacy has been consulted for Zosyn dosing.    WBC is down to 11 and Tmax is 101.5. Chest x-ray today shows diffuse bilateral airspace disease. Last HD session was 3/25 inpatient for 4 hrs with BFR 400 - currently off normal HD schedule. She received a one-time dose of vancomycin yesterday prior to HD. Planning for HD tomorrow.  Plan: Zosyn 3.375 g IV q12h Monitor HD schedule/toleration, clinical improvement, C&S, length of therapy, and ability to de-escalate antibiotics  Height: 5\' 2"  (157.5 cm)(measured at bedside) Weight: 132 lb 7.9 oz (60.1 kg) IBW/kg (Calculated) : 50.1  Temp (24hrs), Avg:99.6 F (37.6 C), Min:97.8 F (36.6 C), Max:101.5 F (38.6 C)  Recent Labs  Lab 12/03/18 1208 12/03/18 1250 12/03/18 1519 12/04/18 0310 12/04/18 0451  WBC 14.1*  --   --   --  11.0*  CREATININE 13.17*  --   --  6.54*  --   LATICACIDVEN  --  2.4* 2.3*  --   --     Estimated Creatinine Clearance: 6.8 mL/min (A) (by C-G formula based on SCr of 6.54 mg/dL (H)).    Allergies  Allergen Reactions  . Acetaminophen Nausea And Vomiting    Patient states Acetaminophen and Acetaminophen containing products make her nauseated. She demonstrated this 06/21/18 with nausea followed by emesis.  Marland Kitchen Hydroxyzine Itching    Antimicrobials this admission: Zosyn 3/26>> Vanc 3/25 x1  Dose adjustments this admission: 3/26: Zosyn adjusted for HD dosing  Microbiology results: 3/25 COVID19: IP 3/25 Flu/RVP: neg 3/25 Bcx: IP  Thank you for allowing pharmacy to be a part of this patient's care.  Jackson Latino, PharmD PGY1 Pharmacy Resident Phone [phone] 12/04/2018     9:07 AM

## 2018-12-04 NOTE — Progress Notes (Signed)
Patient ID: ASHELY Lynch, female   DOB: 1956/05/30, 63 y.o.   MRN: [id number] S: Pt had HD last night with UF of 2.6l but developed respiratory distress towards the end of dialysis last night and needed to be intubated.  She is now hypotensive.   O:BP (!) 81/54   Pulse 63   Temp 98.6 F (37 C) (Oral)   Resp 20   Ht '5\' 2"'$  (1.575 m) Comment: measured at bedside  Wt 60.1 kg   SpO2 100%   BMI 24.23 kg/m   Intake/Output Summary (Last 24 hours) at 12/04/2018 0925 Last data filed at 12/04/2018 0800 Gross per 24 hour  Intake 123.08 ml  Output 2622 ml  Net -2498.92 ml   Intake/Output: I/O last 3 completed shifts: In: 107.7 [I.V.:107.7] Out: 2622 [Urine:10; Other:2612]  Intake/Output this shift:  Total I/O In: 15.4 [I.V.:15.4] Out: -  Weight change:  QMG:[id number] ill-appearing female Exam deferred for covid-19 rule out.  Refer to PCCM exam  Recent Labs  Lab 12/03/18 1208 12/03/18 2146 12/03/18 2333 12/04/18 0310  NA 138 138 137 137  K 5.4* 3.5 3.6 4.2  CL 105  --   --  102  CO2 18*  --   --  23  GLUCOSE 120*  --   --  129*  BUN 83*  --   --  27*  CREATININE 13.17*  --   --  6.54*  ALBUMIN 2.6*  --   --  2.0*  CALCIUM 8.8*  --   --  7.9*  AST 43*  --   --  32  ALT 21  --   --  16   Liver Function Tests: Recent Labs  Lab 12/03/18 1208 12/04/18 0310  AST 43* 32  ALT 21 16  ALKPHOS 63 53  BILITOT 0.9 0.8  PROT 7.4 5.6*  ALBUMIN 2.6* 2.0*   Recent Labs  Lab 12/03/18 1208  LIPASE 33   No results for input(s): AMMONIA in the last 168 hours. CBC: Recent Labs  Lab 12/03/18 1208 12/03/18 2146 12/03/18 2333 12/04/18 0451  WBC 14.1*  --   --  11.0*  NEUTROABS 10.8*  --   --   --   HGB 10.7* 11.6* 13.9 7.8*  HCT 36.2 34.0* 41.0 25.0*  MCV 81.3  --   --  79.4*  PLT 235  --   --  150   Cardiac Enzymes: Recent Labs  Lab 12/03/18 1223  TROPONINI 0.18*   CBG: Recent Labs  Lab 12/03/18 1615 12/03/18 2342 12/04/18 0316  GLUCAP 115* 120* 133*    Iron  Studies: No results for input(s): IRON, TIBC, TRANSFERRIN, FERRITIN in the last 72 hours. Studies/Results: Ct Chest W Contrast  Result Date: 12/03/2018 CLINICAL DATA:  Acute generalized abdominal pain. Shortness of breath. EXAM: CT CHEST, ABDOMEN, AND PELVIS WITH CONTRAST TECHNIQUE: Multidetector CT imaging of the chest, abdomen and pelvis was performed following the standard protocol during bolus administration of intravenous contrast. CONTRAST:  112m OMNIPAQUE IOHEXOL 300 MG/ML  SOLN COMPARISON:  CT scan of October 23, 2018. FINDINGS: CT CHEST FINDINGS Cardiovascular: There is no evidence of thoracic aortic dissection or aneurysm. Mild cardiomegaly is noted. No pericardial effusion is noted. Right internal jugular dialysis catheter is noted. Mediastinum/Nodes: No enlarged mediastinal, hilar, or axillary lymph nodes. Thyroid gland, trachea, and esophagus demonstrate no significant findings. Lungs/Pleura: Bilateral diffuse reticular and possibly airspace opacities are noted throughout both lungs. Also noted is fluid in both major fissures, right greater than  left. Minimal left basilar effusion is noted as well. Given the history of end-stage renal disease and cardiomegaly, these findings are most consistent with pulmonary edema, although superimposed inflammation or infection may be present. Musculoskeletal: No chest wall mass or suspicious bone lesions identified. CT ABDOMEN PELVIS FINDINGS Hepatobiliary: No focal liver abnormality is seen. Status post cholecystectomy. No biliary dilatation. Pancreas: Unremarkable. No pancreatic ductal dilatation or surrounding inflammatory changes. Spleen: Stable focal low density is noted within the spleen consistent with cyst or hemangioma. Adrenals/Urinary Tract: Adrenal glands are unremarkable. Kidneys are normal, without renal calculi, focal lesion, or hydronephrosis. Bladder is unremarkable. Stomach/Bowel: Stomach is within normal limits. Appendix appears normal. No  evidence of bowel wall thickening, distention, or inflammatory changes. Vascular/Lymphatic: Aortic atherosclerosis. No enlarged abdominal or pelvic lymph nodes. Reproductive: Status post hysterectomy. Stable appearance of fatty lesion in left ovary concerning for dermoid. Right adnexal region is unremarkable. Other: No abdominal wall hernia or abnormality. No abdominopelvic ascites. Musculoskeletal: No acute or significant osseous findings. IMPRESSION: Bilateral diffuse interstitial and possibly airspace opacities are noted throughout both lungs, with mild amount of fluid in both major fissures and mild left basilar pleural effusion. These findings are most consistent with pulmonary edema given the history of end-stage renal disease and dialysis, although pneumonia or combination thereof can not be excluded. Stable chronic findings are noted in the abdomen and pelvis as described above. Aortic Atherosclerosis (ICD10-I70.0). Electronically Signed   By: Marijo Conception, M.D.   On: 12/03/2018 16:28   Ct Abdomen Pelvis W Contrast  Result Date: 12/03/2018 CLINICAL DATA:  Acute generalized abdominal pain. Shortness of breath. EXAM: CT CHEST, ABDOMEN, AND PELVIS WITH CONTRAST TECHNIQUE: Multidetector CT imaging of the chest, abdomen and pelvis was performed following the standard protocol during bolus administration of intravenous contrast. CONTRAST:  192m OMNIPAQUE IOHEXOL 300 MG/ML  SOLN COMPARISON:  CT scan of October 23, 2018. FINDINGS: CT CHEST FINDINGS Cardiovascular: There is no evidence of thoracic aortic dissection or aneurysm. Mild cardiomegaly is noted. No pericardial effusion is noted. Right internal jugular dialysis catheter is noted. Mediastinum/Nodes: No enlarged mediastinal, hilar, or axillary lymph nodes. Thyroid gland, trachea, and esophagus demonstrate no significant findings. Lungs/Pleura: Bilateral diffuse reticular and possibly airspace opacities are noted throughout both lungs. Also noted is  fluid in both major fissures, right greater than left. Minimal left basilar effusion is noted as well. Given the history of end-stage renal disease and cardiomegaly, these findings are most consistent with pulmonary edema, although superimposed inflammation or infection may be present. Musculoskeletal: No chest wall mass or suspicious bone lesions identified. CT ABDOMEN PELVIS FINDINGS Hepatobiliary: No focal liver abnormality is seen. Status post cholecystectomy. No biliary dilatation. Pancreas: Unremarkable. No pancreatic ductal dilatation or surrounding inflammatory changes. Spleen: Stable focal low density is noted within the spleen consistent with cyst or hemangioma. Adrenals/Urinary Tract: Adrenal glands are unremarkable. Kidneys are normal, without renal calculi, focal lesion, or hydronephrosis. Bladder is unremarkable. Stomach/Bowel: Stomach is within normal limits. Appendix appears normal. No evidence of bowel wall thickening, distention, or inflammatory changes. Vascular/Lymphatic: Aortic atherosclerosis. No enlarged abdominal or pelvic lymph nodes. Reproductive: Status post hysterectomy. Stable appearance of fatty lesion in left ovary concerning for dermoid. Right adnexal region is unremarkable. Other: No abdominal wall hernia or abnormality. No abdominopelvic ascites. Musculoskeletal: No acute or significant osseous findings. IMPRESSION: Bilateral diffuse interstitial and possibly airspace opacities are noted throughout both lungs, with mild amount of fluid in both major fissures and mild left basilar pleural effusion. These findings  are most consistent with pulmonary edema given the history of end-stage renal disease and dialysis, although pneumonia or combination thereof can not be excluded. Stable chronic findings are noted in the abdomen and pelvis as described above. Aortic Atherosclerosis (ICD10-I70.0). Electronically Signed   By: Marijo Conception, M.D.   On: 12/03/2018 16:28   Dg Chest Port 1  View  Result Date: 12/04/2018 CLINICAL DATA:  Respiratory distress EXAM: PORTABLE CHEST 1 VIEW COMPARISON:  12/03/2018 FINDINGS: Endotracheal tube in good position unchanged. Dual lumen central venous catheter tip at the cavoatrial junction unchanged. NG tube in the stomach. Severe diffuse bilateral airspace disease right greater than left is unchanged. Small bilateral effusions. IMPRESSION: Diffuse bilateral airspace disease right greater than left unchanged. Electronically Signed   By: Franchot Gallo M.D.   On: 12/04/2018 08:17   Dg Chest Port 1 View  Result Date: 12/03/2018 CLINICAL DATA:  63 year old female with possible COVID 19, intubated. EXAM: PORTABLE CHEST 1 VIEW COMPARISON:  Chest CT earlier today. FINDINGS: Portable AP semi upright view at 2211 hours. Endotracheal tube tip in good position between the level the clavicles and carina. Stable right IJ dual lumen catheter. Diffuse bilateral granular pulmonary opacity persists, greater on the right, and not significantly changed since 12/20 5 hours today. Stable cardiomegaly and mediastinal contours. Paucity of bowel gas in the upper abdomen IMPRESSION: 1. Endotracheal tube tip in good position. 2. No significant change in diffuse bilateral granular pulmonary opacity. Electronically Signed   By: Genevie Ann M.D.   On: 12/03/2018 23:08   Dg Chest Port 1 View  Result Date: 12/03/2018 CLINICAL DATA:  Shortness of breath. EXAM: PORTABLE CHEST 1 VIEW COMPARISON:  Radiograph of November 25, 2018. FINDINGS: Stable cardiomegaly. Right internal jugular dialysis catheter is noted. No pneumothorax is noted. Increased bilateral diffuse lung opacities are noted concerning for pneumonia or possibly edema, right greater than left. Small right pleural effusion may be present. Bony thorax is unremarkable. IMPRESSION: Significantly increased bilateral lung opacities are noted, right greater than left, concerning for pneumonia or possibly edema. Small right pleural effusion  is noted. Electronically Signed   By: Marijo Conception, M.D.   On: 12/03/2018 12:58   . chlorhexidine gluconate (MEDLINE KIT)  15 mL Mouth Rinse BID  . Chlorhexidine Gluconate Cloth  6 each Topical Q0600  . doxercalciferol  4 mcg Intravenous Q T,Th,Sa-HD  . heparin  5,000 Units Subcutaneous Q8H  . insulin aspart  0-9 Units Subcutaneous Q4H  . mouth rinse  15 mL Mouth Rinse 10 times per day  . metroNIDAZOLE  2,000 mg Per Tube Once  . midodrine  10 mg Per Tube TID WC  . nitroGLYCERIN      . pantoprazole (PROTONIX) IV  40 mg Intravenous Q12H    BMET    Component Value Date/Time   NA 137 12/04/2018 0310   NA 135 03/25/2018 1014   K 4.2 12/04/2018 0310   CL 102 12/04/2018 0310   CO2 23 12/04/2018 0310   GLUCOSE 129 (H) 12/04/2018 0310   BUN 27 (H) 12/04/2018 0310   BUN 96 (HH) 03/25/2018 1014   CREATININE 6.54 (H) 12/04/2018 0310   CALCIUM 7.9 (L) 12/04/2018 0310   CALCIUM 8.3 (L) 09/23/2018 1329   GFRNONAA 6 (L) 12/04/2018 0310   GFRAA 7 (L) 12/04/2018 0310   CBC    Component Value Date/Time   WBC 11.0 (H) 12/04/2018 0451   RBC 3.15 (L) 12/04/2018 0451   HGB 7.8 (L) 12/04/2018 0451  HGB 8.0 (L) 03/27/2018 1107   HCT 25.0 (L) 12/04/2018 0451   HCT 25.3 (L) 03/27/2018 1107   PLT 150 12/04/2018 0451   PLT 126 (L) 03/27/2018 1107   MCV 79.4 (L) 12/04/2018 0451   MCV 83 03/27/2018 1107   MCH 24.8 (L) 12/04/2018 0451   MCHC 31.2 12/04/2018 0451   RDW 20.7 (H) 12/04/2018 0451   RDW 19.9 (H) 03/27/2018 1107   LYMPHSABS 2.3 12/03/2018 1208   LYMPHSABS 0.5 (L) 03/27/2018 1107   MONOABS 0.9 12/03/2018 1208   EOSABS 0.0 12/03/2018 1208   EOSABS 0.0 03/27/2018 1107   BASOSABS 0.1 12/03/2018 1208   BASOSABS 0.0 03/27/2018 1107    Dialysis Orders:  [facility], TueThuSat, 4 hrs 0 min, 180NRe Optiflux, BFR 400, DFR Manual 800 mL/min, EDW 58.5 (kg), Dialysate 2.0 K, 2.5 Ca, 1.0 Mg, 100 Dextrose (G2251), Sodium 137 (mEq/L), Bicarb Setting: 35 (mEq/L), UFR Profile: None, Sodium Model:  None, Access: CVCatheter-Tunneled, and left AVGG placed 2/17 Hectorol 4, Mircera 100 w 2 week - did get 150 3/21 - last hgb 10.9 3/21  Assessment/Plan: 1. VDRF/Acute hypoxic respiratory failure with fever/hemoptysis - influenza neg, droplet precautions - pulmonary edema a component with possible PNA - work up and abtx per pulmonary - no heparin due to hemoptysis- though she has SQ heparin order. Abtx per PCCM.  CT scan c/w pulmonary edema and no acute GI findings on CT. 2. ESRD -  TTS - off schedule - last outpatient was 3/21 for 2 hr - HD yesterday.  Hold off on further HD today due to hypotension. 3. Vascular access-should be able to use AVGG over month old - if works ok, need to get [facility] out this admission. Follow daily - may need an extra treatment tomorrow 4. Hypertension/volume  - should improve with lowering of volume 5. Anemia  - stable - not due for ESA 6. Metabolic bone disease -  Continue hectorol - start with next HD session - likely will need HD in next 24-48 hours.  She has been on renvela 2 ac with 2.5 bath - corrected Ca doesn't warranted 2.5 at this time. 7. Nutrition - npo 8. 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

## 2018-12-05 ENCOUNTER — Inpatient Hospital Stay (HOSPITAL_COMMUNITY): Payer: Medicare Other

## 2018-12-05 LAB — BASIC METABOLIC PANEL
Anion gap: 14 (ref 5–15)
BUN: 60 mg/dL — AB (ref 8–23)
CALCIUM: 8.1 mg/dL — AB (ref 8.9–10.3)
CO2: 23 mmol/L (ref 22–32)
CREATININE: 8.78 mg/dL — AB (ref 0.44–1.00)
Chloride: 101 mmol/L (ref 98–111)
GFR calc Af Amer: 5 mL/min — ABNORMAL LOW (ref >60)
GFR calc non Af Amer: 4 mL/min — ABNORMAL LOW (ref >60)
Glucose, Bld: 128 mg/dL — ABNORMAL HIGH (ref 70–99)
Potassium: 4.1 mmol/L (ref 3.5–5.1)
Sodium: 138 mmol/L (ref 135–145)

## 2018-12-05 LAB — LIPASE, BLOOD: Lipase: 29 U/L (ref 11–51)

## 2018-12-05 LAB — OCCULT BLOOD X 1 CARD TO LAB, STOOL: Fecal Occult Bld: POSITIVE — AB

## 2018-12-05 LAB — HEPATIC FUNCTION PANEL
ALT: 15 U/L (ref 0–44)
AST: 24 U/L (ref 15–41)
Albumin: 2 g/dL — ABNORMAL LOW (ref 3.5–5.0)
Alkaline Phosphatase: 60 U/L (ref 38–126)
Bilirubin, Direct: 0.2 mg/dL (ref 0.0–0.2)
Indirect Bilirubin: 0.6 mg/dL (ref 0.3–0.9)
Total Bilirubin: 0.8 mg/dL (ref 0.3–1.2)
Total Protein: 6.5 g/dL (ref 6.5–8.1)

## 2018-12-05 LAB — HIV ANTIBODY (ROUTINE TESTING W REFLEX): HIV SCREEN 4TH GENERATION: NONREACTIVE

## 2018-12-05 LAB — GLUCOSE, CAPILLARY
Glucose-Capillary: 103 mg/dL — ABNORMAL HIGH (ref 70–99)
Glucose-Capillary: 104 mg/dL — ABNORMAL HIGH (ref 70–99)
Glucose-Capillary: 108 mg/dL — ABNORMAL HIGH (ref 70–99)
Glucose-Capillary: 119 mg/dL — ABNORMAL HIGH (ref 70–99)
Glucose-Capillary: 119 mg/dL — ABNORMAL HIGH (ref 70–99)
Glucose-Capillary: 97 mg/dL (ref 70–99)

## 2018-12-05 LAB — CBC
HCT: 27.3 % — ABNORMAL LOW (ref 36.0–46.0)
Hemoglobin: 8.2 g/dL — ABNORMAL LOW (ref 12.0–15.0)
MCH: 24.1 pg — ABNORMAL LOW (ref 26.0–34.0)
MCHC: 30 g/dL (ref 30.0–36.0)
MCV: 80.3 fL (ref 80.0–100.0)
Platelets: 189 10*3/uL (ref 150–400)
RBC: 3.4 MIL/uL — ABNORMAL LOW (ref 3.87–5.11)
RDW: 21.1 % — AB (ref 11.5–15.5)
WBC: 7.7 10*3/uL (ref 4.0–10.5)
nRBC: 0 % (ref 0.0–0.2)

## 2018-12-05 LAB — MAGNESIUM: Magnesium: 2.1 mg/dL (ref 1.7–2.4)

## 2018-12-05 MED ORDER — DOXERCALCIFEROL 4 MCG/2ML IV SOLN
INTRAVENOUS | Status: AC
  Administered 2018-12-05: 4 ug via INTRAVENOUS
  Filled 2018-12-05: qty 2

## 2018-12-05 MED ORDER — FENTANYL BOLUS VIA INFUSION
50.0000 ug | INTRAVENOUS | Status: DC | PRN
  Administered 2018-12-05 – 2018-12-06 (×2): 50 ug via INTRAVENOUS
  Filled 2018-12-05 (×2): qty 50

## 2018-12-05 MED ORDER — ONDANSETRON HCL 4 MG/2ML IJ SOLN
4.0000 mg | Freq: Once | INTRAMUSCULAR | Status: AC
  Administered 2018-12-05: 4 mg via INTRAVENOUS
  Filled 2018-12-05: qty 2

## 2018-12-05 MED ORDER — DIPHENHYDRAMINE HCL 12.5 MG/5ML PO ELIX
12.5000 mg | ORAL_SOLUTION | Freq: Four times a day (QID) | ORAL | Status: DC | PRN
  Administered 2018-12-05 – 2018-12-06 (×3): 12.5 mg
  Filled 2018-12-05 (×5): qty 5

## 2018-12-05 MED ORDER — HEPARIN SODIUM (PORCINE) 1000 UNIT/ML DIALYSIS
1000.0000 [IU] | INTRAMUSCULAR | Status: DC | PRN
  Administered 2018-12-05: 1000 [IU] via INTRAVENOUS_CENTRAL
  Filled 2018-12-05 (×4): qty 6

## 2018-12-05 NOTE — Progress Notes (Signed)
[name], female   DOB: 20-Jul-1956, 63 y.o.   MRN: [id number] S:No events overnight and BP markedly improved over the past 24 hours O:BP (!) 166/118   Pulse (!) 56   Temp 97.8 F (36.6 C) (Oral)   Resp 15   Ht _0  (1.575 m) Comment: measured at bedside  Wt 60.1 kg   SpO2 100%   BMI 24.23 kg/m   Intake/Output Summary (Last 24 hours) at 12/05/2018 1011 Last data filed at 12/05/2018 0900 Gross per 24 hour  Intake 869.96 ml  Output -  Net 869.96 ml   Intake/Output: I/O last 3 completed shifts: In: 948.7 [I.V.:318.8; NG/GT:530; IV Piggyback:99.9] Out: 2612 [Other:2612]  Intake/Output this shift:  Total I/O In: 44.3 [I.V.:14.3; NG/GT:30] Out: -  Weight change:  VFI:EPPIRJJOA and agitated, scratching her legs CZY:[id number] Full-contact physical exam was not possible due to potential covid-19 infection and utilizing key findings from [facility] exam.  Recent Labs  Lab 12/03/18 1208 12/03/18 2146 12/03/18 2333 12/04/18 0310 12/04/18 1207 12/05/18 0422  NA 138 138 137 137 136 138  K 5.4* 3.5 3.6 4.2 4.3 4.1  CL 105  --   --  102  --  101  CO2 18*  --   --  23  --  23  GLUCOSE 120*  --   --  129*  --  128*  BUN 83*  --   --  27*  --  60*  CREATININE 13.17*  --   --  6.54*  --  8.78*  ALBUMIN 2.6*  --   --  2.0*  --  2.0*  CALCIUM 8.8*  --   --  7.9*  --  8.1*  AST 43*  --   --  32  --  24  ALT 21  --   --  16  --  15   Liver Function Tests: Recent Labs  Lab 12/03/18 1208 12/04/18 0310 12/05/18 0422  AST 43* 32 24  ALT _1 ALKPHOS 63 53 60  BILITOT 0.9 0.8 0.8  PROT 7.4 5.6* 6.5  ALBUMIN 2.6* 2.0* 2.0*   Recent Labs  Lab 12/03/18 1208 12/05/18 0422  LIPASE 33 29   No results for input(s): AMMONIA in the last 168 hours. CBC: Recent Labs  Lab 12/03/18 1208  12/04/18 0451 12/04/18 1207 12/05/18 0422  WBC 14.1*  --  11.0*  --  7.7  NEUTROABS 10.8*  --   --   --   --   HGB 10.7*   < > 7.8* 8.2* 8.2*  HCT 36.2   < > 25.0* 24.0* 27.3*   MCV 81.3  --  79.4*  --  80.3  PLT 235  --  150  --  189   < > = values in this interval not displayed.   Cardiac Enzymes: Recent Labs  Lab 12/03/18 1223  TROPONINI 0.18*   CBG: Recent Labs  Lab 12/04/18 1707 12/04/18 1959 12/05/18 0016 12/05/18 0406 12/05/18 0743  GLUCAP 125* 94 119* 119* 103*    Iron Studies: No results for input(s): IRON, TIBC, TRANSFERRIN, FERRITIN in the last 72 hours. Studies/Results: Ct Chest W Contrast  Result Date: 12/03/2018 CLINICAL DATA:  Acute generalized abdominal pain. Shortness of breath. EXAM: CT CHEST, ABDOMEN, AND PELVIS WITH CONTRAST TECHNIQUE: Multidetector CT imaging of the chest, abdomen and pelvis was performed following the standard protocol during bolus administration of intravenous contrast. CONTRAST:  126m OMNIPAQUE IOHEXOL 300 MG/ML  SOLN COMPARISON:  CT scan of October 23, 2018. FINDINGS: CT CHEST FINDINGS Cardiovascular: There is no evidence of thoracic aortic dissection or aneurysm. Mild cardiomegaly is noted. No pericardial effusion is noted. Right internal jugular dialysis catheter is noted. Mediastinum/Nodes: No enlarged mediastinal, hilar, or axillary lymph nodes. Thyroid gland, trachea, and esophagus demonstrate no significant findings. Lungs/Pleura: Bilateral diffuse reticular and possibly airspace opacities are noted throughout both lungs. Also noted is fluid in both major fissures, right greater than left. Minimal left basilar effusion is noted as well. Given the history of end-stage renal disease and cardiomegaly, these findings are most consistent with pulmonary edema, although superimposed inflammation or infection may be present. Musculoskeletal: No chest wall mass or suspicious bone lesions identified. CT ABDOMEN PELVIS FINDINGS Hepatobiliary: No focal liver abnormality is seen. Status post cholecystectomy. No biliary dilatation. Pancreas: Unremarkable. No pancreatic ductal dilatation or surrounding inflammatory changes.  Spleen: Stable focal low density is noted within the spleen consistent with cyst or hemangioma. Adrenals/Urinary Tract: Adrenal glands are unremarkable. Kidneys are normal, without renal calculi, focal lesion, or hydronephrosis. Bladder is unremarkable. Stomach/Bowel: Stomach is within normal limits. Appendix appears normal. No evidence of bowel wall thickening, distention, or inflammatory changes. Vascular/Lymphatic: Aortic atherosclerosis. No enlarged abdominal or pelvic lymph nodes. Reproductive: Status post hysterectomy. Stable appearance of fatty lesion in left ovary concerning for dermoid. Right adnexal region is unremarkable. Other: No abdominal wall hernia or abnormality. No abdominopelvic ascites. Musculoskeletal: No acute or significant osseous findings. IMPRESSION: Bilateral diffuse interstitial and possibly airspace opacities are noted throughout both lungs, with mild amount of fluid in both major fissures and mild left basilar pleural effusion. These findings are most consistent with pulmonary edema given the history of end-stage renal disease and dialysis, although pneumonia or combination thereof can not be excluded. Stable chronic findings are noted in the abdomen and pelvis as described above. Aortic Atherosclerosis (ICD10-I70.0). Electronically Signed   By: Marijo Conception, M.D.   On: 12/03/2018 16:28   Ct Abdomen Pelvis W Contrast  Result Date: 12/03/2018 CLINICAL DATA:  Acute generalized abdominal pain. Shortness of breath. EXAM: CT CHEST, ABDOMEN, AND PELVIS WITH CONTRAST TECHNIQUE: Multidetector CT imaging of the chest, abdomen and pelvis was performed following the standard protocol during bolus administration of intravenous contrast. CONTRAST:  125m OMNIPAQUE IOHEXOL 300 MG/ML  SOLN COMPARISON:  CT scan of October 23, 2018. FINDINGS: CT CHEST FINDINGS Cardiovascular: There is no evidence of thoracic aortic dissection or aneurysm. Mild cardiomegaly is noted. No pericardial effusion is  noted. Right internal jugular dialysis catheter is noted. Mediastinum/Nodes: No enlarged mediastinal, hilar, or axillary lymph nodes. Thyroid gland, trachea, and esophagus demonstrate no significant findings. Lungs/Pleura: Bilateral diffuse reticular and possibly airspace opacities are noted throughout both lungs. Also noted is fluid in both major fissures, right greater than left. Minimal left basilar effusion is noted as well. Given the history of end-stage renal disease and cardiomegaly, these findings are most consistent with pulmonary edema, although superimposed inflammation or infection may be present. Musculoskeletal: No chest wall mass or suspicious bone lesions identified. CT ABDOMEN PELVIS FINDINGS Hepatobiliary: No focal liver abnormality is seen. Status post cholecystectomy. No biliary dilatation. Pancreas: Unremarkable. No pancreatic ductal dilatation or surrounding inflammatory changes. Spleen: Stable focal low density is noted within the spleen consistent with cyst or hemangioma. Adrenals/Urinary Tract: Adrenal glands are unremarkable. Kidneys are normal, without renal calculi, focal lesion, or hydronephrosis. Bladder is unremarkable. Stomach/Bowel: Stomach is within normal limits. Appendix appears normal. No evidence of bowel wall thickening, distention, or  inflammatory changes. Vascular/Lymphatic: Aortic atherosclerosis. No enlarged abdominal or pelvic lymph nodes. Reproductive: Status post hysterectomy. Stable appearance of fatty lesion in left ovary concerning for dermoid. Right adnexal region is unremarkable. Other: No abdominal wall hernia or abnormality. No abdominopelvic ascites. Musculoskeletal: No acute or significant osseous findings. IMPRESSION: Bilateral diffuse interstitial and possibly airspace opacities are noted throughout both lungs, with mild amount of fluid in both major fissures and mild left basilar pleural effusion. These findings are most consistent with pulmonary edema given  the history of end-stage renal disease and dialysis, although pneumonia or combination thereof can not be excluded. Stable chronic findings are noted in the abdomen and pelvis as described above. Aortic Atherosclerosis (ICD10-I70.0). Electronically Signed   By: Marijo Conception, M.D.   On: 12/03/2018 16:28   Dg Chest Port 1 View  Result Date: 12/05/2018 CLINICAL DATA:  Hypoxia EXAM: PORTABLE CHEST 1 VIEW COMPARISON:  December 04, 2018 FINDINGS: Endotracheal tube tip is 2.1 cm above the carina. Nasogastric tube tip and side port are below the diaphragm. Side port is seen in the stomach. Central catheter tip is in the superior vena cava. No pneumothorax. There is interstitial and patchy airspace opacity throughout the lungs, more severe on the right than on the left. There has been partial clearing of airspace opacity on the right and left sides compared to 1 day prior. No new opacity evident. Heart remains enlarged with mild pulmonary venous hypertension. No adenopathy. There is aortic atherosclerosis. No bone lesions. IMPRESSION: Tube and catheter positions as described without pneumothorax. Areas of interstitial and airspace opacity bilaterally, more severe on the right than on the left, with mild partial clearing bilaterally. The appearance raises question of a degree of ARDS. No new opacity evident. Underlying pulmonary vascular congestion noted. Aortic Atherosclerosis (ICD10-I70.0). Electronically Signed   By: Lowella Grip III M.D.   On: 12/05/2018 07:20   Dg Chest Port 1 View  Result Date: 12/04/2018 CLINICAL DATA:  Respiratory distress EXAM: PORTABLE CHEST 1 VIEW COMPARISON:  12/03/2018 FINDINGS: Endotracheal tube in good position unchanged. Dual lumen central venous catheter tip at the cavoatrial junction unchanged. NG tube in the stomach. Severe diffuse bilateral airspace disease right greater than left is unchanged. Small bilateral effusions. IMPRESSION: Diffuse bilateral airspace disease right  greater than left unchanged. Electronically Signed   By: Franchot Gallo M.D.   On: 12/04/2018 08:17   Dg Chest Port 1 View  Result Date: 12/03/2018 CLINICAL DATA:  63 year old female with possible COVID 19, intubated. EXAM: PORTABLE CHEST 1 VIEW COMPARISON:  Chest CT earlier today. FINDINGS: Portable AP semi upright view at 2211 hours. Endotracheal tube tip in good position between the level the clavicles and carina. Stable right IJ dual lumen catheter. Diffuse bilateral granular pulmonary opacity persists, greater on the right, and not significantly changed since 12/20 5 hours today. Stable cardiomegaly and mediastinal contours. Paucity of bowel gas in the upper abdomen IMPRESSION: 1. Endotracheal tube tip in good position. 2. No significant change in diffuse bilateral granular pulmonary opacity. Electronically Signed   By: Genevie Ann M.D.   On: 12/03/2018 23:08   Dg Chest Port 1 View  Result Date: 12/03/2018 CLINICAL DATA:  Shortness of breath. EXAM: PORTABLE CHEST 1 VIEW COMPARISON:  Radiograph of November 25, 2018. FINDINGS: Stable cardiomegaly. Right internal jugular dialysis catheter is noted. No pneumothorax is noted. Increased bilateral diffuse lung opacities are noted concerning for pneumonia or possibly edema, right greater than left. Small right pleural effusion may be present.  Bony thorax is unremarkable. IMPRESSION: Significantly increased bilateral lung opacities are noted, right greater than left, concerning for pneumonia or possibly edema. Small right pleural effusion is noted. Electronically Signed   By: Marijo Conception, M.D.   On: 12/03/2018 12:58   . chlorhexidine gluconate (MEDLINE KIT)  15 mL Mouth Rinse BID  . Chlorhexidine Gluconate Cloth  6 each Topical Q0600  . doxercalciferol  4 mcg Intravenous Q M,W,F-HD  . feeding supplement (PRO-STAT SUGAR FREE 64)  30 mL Oral BID  . heparin  5,000 Units Subcutaneous Q8H  . insulin aspart  0-9 Units Subcutaneous Q4H  . mouth rinse  15 mL Mouth  Rinse 10 times per day  . midodrine  10 mg Per Tube Q8H  . pantoprazole (PROTONIX) IV  40 mg Intravenous Q12H    BMET    Component Value Date/Time   NA 138 12/05/2018 0422   NA 135 03/25/2018 1014   K 4.1 12/05/2018 0422   CL 101 12/05/2018 0422   CO2 23 12/05/2018 0422   GLUCOSE 128 (H) 12/05/2018 0422   BUN 60 (H) 12/05/2018 0422   BUN 96 (HH) 03/25/2018 1014   CREATININE 8.78 (H) 12/05/2018 0422   CALCIUM 8.1 (L) 12/05/2018 0422   CALCIUM 8.3 (L) 09/23/2018 1329   GFRNONAA 4 (L) 12/05/2018 0422   GFRAA 5 (L) 12/05/2018 0422   CBC    Component Value Date/Time   WBC 7.7 12/05/2018 0422   RBC 3.40 (L) 12/05/2018 0422   HGB 8.2 (L) 12/05/2018 0422   HGB 8.0 (L) 03/27/2018 1107   HCT 27.3 (L) 12/05/2018 0422   HCT 25.3 (L) 03/27/2018 1107   PLT 189 12/05/2018 0422   PLT 126 (L) 03/27/2018 1107   MCV 80.3 12/05/2018 0422   MCV 83 03/27/2018 1107   MCH 24.1 (L) 12/05/2018 0422   MCHC 30.0 12/05/2018 0422   RDW 21.1 (H) 12/05/2018 0422   RDW 19.9 (H) 03/27/2018 1107   LYMPHSABS 2.3 12/03/2018 1208   LYMPHSABS 0.5 (L) 03/27/2018 1107   MONOABS 0.9 12/03/2018 1208   EOSABS 0.0 12/03/2018 1208   EOSABS 0.0 03/27/2018 1107   BASOSABS 0.1 12/03/2018 1208   BASOSABS 0.0 03/27/2018 1107    Dialysis Orders: [place], TueThuSat, 4 hrs 0 min, 180NRe Optiflux, BFR 400, DFR Manual 800 mL/min, EDW 58.5 (kg), Dialysate 2.0 K, 2.5 Ca, 1.0 Mg, 100 Dextrose (G2251), Sodium 137 (mEq/L), Bicarb Setting: 35 (mEq/L), UFR Profile: None, Sodium Model: None, Access: CVCatheter-Tunneled,and left AVGG placed 2/17 Hectorol 4, Mircera 100 w 2 week - did get 150 3/21 - last hgb 10.9 3/21  Assessment/Plan:  1. Acute hypoxic respiratory failure with fever and hemoptysis- ruling out for Covid-19.  CT scan c/w pulmonary edema  2. ESRD- off schedule and will plan for HD today due to hypertension.  Plan for UF given VDRF and pulmonary edema 3. HTN/volume- UF with HD 4. Anemia of CKD-  hgb dropping.   Follow and transfuse prn. Will redose ESA 5. Vascular access- LAVG placed 2/17 and should be used but will use HD cath for now and attempt to use avg next HD session. 6. Substance abuse- + cocaine 7. H/o BOOP and ANCA vasculitis  Donetta Potts, MD [facility] [phone]

## 2018-12-05 NOTE — Progress Notes (Signed)
NAME:  Rhonda Lynch, MRN:  [id number], DOB:  [birth year], LOS: 2 ADMISSION DATE:  12/03/2018, CONSULTATION DATE:  12/03/2018 REFERRING MD:  Dr. Alvino Chapel, CHIEF COMPLAINT:  SOB  Brief History   49 yoF ESRD TTS presenting with nausea, vomiting, abdominal pain, and slight SOB for ~ 3 days.  Last iHD -partial iHD on 3/21.  Found hypoxic requiring NRB, hypertensive, temp 100.5 rectal, with small amount of hematemesis in ER.  CXR c/w with bilateral infiltrates likely pulmonary edema.   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   Of note, patient recently admitted 3/8 -3/13 with bilateral lung infiltrates thought to be probable pulmonary edema vs pneumonia in the setting of missed dialysis , treated with iHD and antibiotics.  She was cocaine positive.  She was again admitted 3/17 - 3/18 with AMS thought related to overuse/ likely unintentional cocaine overdose.   Patient reports having partial iHD on 3/21.  Denies sick contacts, fever, known COVID -19 exposure, travel, or recent drug use.  She was found by EMS hypoxic with sats in the 80's.  In ER, she remained hypertensive, hypoxic, rectal temp 100.5. CXR showed bilateral infiltrates. Flu neg.  Labs noted for K 5.4, glucose 120, BUN 83, sCr 13.17, WBC 14.1, Hgb 10.7, Lactic 2.4, trop I 0.18 and BNP >4500, EKG non acute. 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  Received Plex, pulse dose steroids with prednisone taper and p.o. Cytoxan at that time.  She is no longer on immunosuppression.  Significant Hospital Events   3/8- 3/13 admit 3/17 -3/18 admit 3/25 Admit  Consults:  Nephrology  Procedures:   Significant Diagnostic Tests:  3/25 CT chest >>  3/25 CT abd/ pelvis >> 3/25 UDS >> positive for cocaine  Micro Data:  3/25 BCx 2 >> 3/25 UC >> 3/25 RVP >> negative 3/25 novel coronavirus >>   Antimicrobials:  3/25 vanc x1  3/25 flagyl x 1 3/25 cefepime x1 3//26 Zosyn >>   Interim history/subjective:  Patient remains ventilated, has continued to have cough with some blood-tinged secretions.  One clot reported suctioned Tolerating PSVT currently RVP negative   Objective   Blood pressure (!) 171/74, pulse 89, temperature 97.8 F (36.6 C), temperature source Oral, resp. rate 15, height 5\' 2"  (1.575 m), weight 60.1 kg, SpO2 100 %.    Vent Mode: CPAP;PSV FiO2 (%):  [40 %-50 %] 40 % Set Rate:  [28 bmp] 28 bmp Vt Set:  [300 mL] 300 mL PEEP:  [5 cmH20-8 cmH20] 5 cmH20 Pressure Support:  [10 YKD98-33 cmH20] 10 cmH20 Plateau Pressure:  [14 cmH20-19 cmH20] 14 cmH20   Intake/Output Summary (Last 24 hours) at 12/05/2018 1235 Last data filed at 12/05/2018 1200 Gross per 24 hour  Intake 762.26 ml  Output -  Net 762.26 ml   Filed Weights   12/03/18 1143 12/03/18 1742 12/03/18 2130  Weight: 55 kg 64.2 kg 60.1 kg  Examination: Gen: Acute and chronically ill-appearing woman, intubated, awake Neuro: Wide-awake, able to nod to questions, follow commands moves all extremities. HEENT: ET tube is in good position Neck: Right subclavian tunneled dialysis catheter Lungs: Coarse bilateral breath sounds, no wheezing.  Small amount of pink frothy secretions in the ventilator circuit CV: Regular, no murmur Abd: Soft, nondistended, no significant tenderness, no rebound Ext: Trace lower extremity edema, 1+ upper extremity edema   Resolved Hospital Problem list    Assessment & Plan:  Acute hypoxic respiratory failure Bilateral infiltrates-consistent with acute pulmonary edema, cocaine injury. -Suspicion for infectious process either bacterial or viral is low Hemoptysis, blood-tinged secretions P:  Continue mechanical ventilation, ARDS protocol.  I am hopeful that she will be able to progress with spontaneous breathing and possibly extubation after dialysis and volume removal today 3/27 I will repeat her chest x-ray following hemodialysis to assess for interval improvement Continue HCAP coverage as above Low suspicion COVID, on airborne isolation and testing is pending  Hypertensive Urgency, resolved, now hypotensive on sedation - setting of ?cocaine abuse, non-compliance, and missed iHD P:  Hopefully will be able to add back her home Norvasc soon Avoid beta-blockers in setting of cocaine abuse Continue Midrin, added 3/26   ESRD on TTS iHD Mild hyperkalemia P:  Appreciate nephrology assistance.  Planning for repeat hemodialysis 3/27, 3 L goal removal  DMT2  P:  Sliding-scale insulin sensitive scale Lantus on hold until nutrition initiated  Substance abuse Severe medical non-compliance  P:  Ongoing counseling Given this is her 3rd admit this month, consider PMT consult given ongoing noncompliance   N/V, small amount hematemesis at time of presentation - no diarrhea - CT abdomen without any abnormality -Hemoccult positive P:  Tube feeding as ordered PPI twice daily  Chronic Normocytic Anemia P:  Follow CBC Follow for any evidence of recurrent bleeding  Trichomonas, noted on UA P: Treated with Flagyl, completed   Best practice:  Diet: NPO Pain/Anxiety/Delirium protocol (if indicated): n/a VAP protocol (if indicated): n/a DVT prophylaxis: heparin SQ/ SCDs GI prophylaxis: PPI BID Glucose control: SSI Mobility: BR Code Status: Full  Family Communication: Will communicate with family by  phone, plans reviewed with the patient Disposition: ICU  Labs   CBC: Recent Labs  Lab 12/03/18 1208 12/03/18 2146 12/03/18 2333 12/04/18 0451 12/04/18 1207 12/05/18 0422  WBC 14.1*  --   --  11.0*  --  7.7  NEUTROABS 10.8*  --   --   --   --   --   HGB 10.7* 11.6* 13.9 7.8* 8.2* 8.2*  HCT 36.2 34.0* 41.0 25.0* 24.0* 27.3*  MCV 81.3  --   --  79.4*  --  80.3  PLT 235  --   --  150  --  852    Basic Metabolic Panel: Recent Labs  Lab 12/03/18 1208 12/03/18 2146 12/03/18 2333 12/04/18 0310 12/04/18 1207 12/05/18 0422  NA 138 138 137 137 136 138  K 5.4* 3.5 3.6 4.2 4.3 4.1  CL 105  --   --  102  --  101  CO2 18*  --   --  23  --  23  GLUCOSE 120*  --   --  129*  --  128*  BUN 83*  --   --  27*  --  60*  CREATININE 13.17*  --   --  6.54*  --  8.78*  CALCIUM 8.8*  --   --  7.9*  --  8.1*  MG  --   --   --   --   --  2.1   GFR: Estimated Creatinine Clearance: 5.1 mL/min (A) (by C-G formula based on SCr of 8.78 mg/dL (H)). Recent Labs  Lab 12/03/18 1208 12/03/18 1250 12/03/18 1519 12/04/18 0451 12/05/18 0422  WBC 14.1*  --   --  11.0* 7.7  LATICACIDVEN  --  2.4* 2.3*  --   --     Liver Function Tests: Recent Labs  Lab 12/03/18 1208 12/04/18 0310 12/05/18 0422  AST 43* 32 24  ALT 21 16 15   ALKPHOS 63 53 60  BILITOT 0.9 0.8 0.8  PROT 7.4 5.6* 6.5  ALBUMIN 2.6* 2.0* 2.0*   Recent Labs  Lab 12/03/18 1208 12/05/18 0422  LIPASE 33 29   No results for input(s): AMMONIA in the last 168 hours.  ABG    Component Value Date/Time   PHART 7.409 12/04/2018 1207   PCO2ART 45.0 12/04/2018 1207   PO2ART 142.0 (H) 12/04/2018 1207   HCO3 28.5 (H) 12/04/2018 1207   TCO2 30 12/04/2018 1207   ACIDBASEDEF 9.0 (H) 11/25/2018 0451   O2SAT 99.0 12/04/2018 1207     Coagulation Profile: No results for input(s): INR, PROTIME in the last 168 hours.  Cardiac Enzymes: Recent Labs  Lab 12/03/18 1223  TROPONINI 0.18*    HbA1C: Hgb A1c MFr Bld  Date/Time Value  Ref Range Status  07/19/2018 05:34 AM 6.1 (H) 4.8 - 5.6 % Final    Comment:    (NOTE) Pre diabetes:          5.7%-6.4% Diabetes:              >6.4% Glycemic control for   <7.0% adults with diabetes   04/02/2018 02:45 PM 6.8 (H) 4.8 - 5.6 % Final    Comment:    (NOTE) Pre diabetes:          5.7%-6.4% Diabetes:              >6.4% Glycemic control for   <7.0% adults with diabetes     CBG: Recent Labs  Lab 12/04/18 1959 12/05/18 0016 12/05/18 0406 12/05/18 0743 12/05/18 1134  GLUCAP 94 119* 119* 103* 108*     Critical care time: 33 mins    Baltazar Apo, MD, PhD 12/05/2018, 12:35 PM [facility] [phone] or if no answer [phone]

## 2018-12-05 NOTE — Progress Notes (Signed)
eLink Physician-Brief Progress Note Patient Name: DENIM START DOB: 05-23-1956 MRN: [id number]   Date of Service  12/05/2018  HPI/Events of Note  Pt itching over her back, no extremity or facial itching and no rash, LFT's unremarkable  eICU Interventions  Benadryl 12.5 mg via NGT Q 6 hours prn itching        Frederik Pear 12/05/2018, 2:46 AM

## 2018-12-06 ENCOUNTER — Inpatient Hospital Stay (HOSPITAL_COMMUNITY): Payer: Medicare Other

## 2018-12-06 LAB — CBC
HCT: 25.3 % — ABNORMAL LOW (ref 36.0–46.0)
Hemoglobin: 8.1 g/dL — ABNORMAL LOW (ref 12.0–15.0)
MCH: 25.2 pg — ABNORMAL LOW (ref 26.0–34.0)
MCHC: 32 g/dL (ref 30.0–36.0)
MCV: 78.8 fL — ABNORMAL LOW (ref 80.0–100.0)
Platelets: 187 10*3/uL (ref 150–400)
RBC: 3.21 MIL/uL — ABNORMAL LOW (ref 3.87–5.11)
RDW: 20.5 % — AB (ref 11.5–15.5)
WBC: 8.3 10*3/uL (ref 4.0–10.5)
nRBC: 0.6 % — ABNORMAL HIGH (ref 0.0–0.2)

## 2018-12-06 LAB — MAGNESIUM: Magnesium: 1.9 mg/dL (ref 1.7–2.4)

## 2018-12-06 LAB — GLUCOSE, CAPILLARY
GLUCOSE-CAPILLARY: 85 mg/dL (ref 70–99)
Glucose-Capillary: 89 mg/dL (ref 70–99)
Glucose-Capillary: 91 mg/dL (ref 70–99)
Glucose-Capillary: 94 mg/dL (ref 70–99)
Glucose-Capillary: 88 mg/dL (ref 70–99)
Glucose-Capillary: 121 mg/dL — ABNORMAL HIGH (ref 70–99)

## 2018-12-06 LAB — PHOSPHORUS: Phosphorus: 4.2 mg/dL (ref 2.5–4.6)

## 2018-12-06 MED ORDER — POTASSIUM CHLORIDE 20 MEQ PO PACK
20.0000 meq | PACK | Freq: Once | ORAL | Status: AC
  Administered 2018-12-06: 20 meq via ORAL
  Filled 2018-12-06: qty 1

## 2018-12-06 MED ORDER — OXYCODONE HCL 5 MG PO TABS
5.0000 mg | ORAL_TABLET | Freq: Once | ORAL | Status: AC
  Administered 2018-12-06: 5 mg via ORAL
  Filled 2018-12-06: qty 1

## 2018-12-06 MED ORDER — ORAL CARE MOUTH RINSE
15.0000 mL | Freq: Two times a day (BID) | OROMUCOSAL | Status: DC
  Administered 2018-12-06 – 2018-12-10 (×7): 15 mL via OROMUCOSAL

## 2018-12-06 MED ORDER — DIPHENHYDRAMINE HCL 12.5 MG/5ML PO ELIX
12.5000 mg | ORAL_SOLUTION | Freq: Four times a day (QID) | ORAL | Status: DC | PRN
  Administered 2018-12-06: 2.5 mg via ORAL
  Administered 2018-12-06 – 2018-12-09 (×5): 12.5 mg via ORAL
  Filled 2018-12-06 (×7): qty 5

## 2018-12-06 MED ORDER — AMLODIPINE BESYLATE 5 MG PO TABS
5.0000 mg | ORAL_TABLET | Freq: Every day | ORAL | Status: DC
  Administered 2018-12-06 – 2018-12-12 (×6): 5 mg via ORAL
  Filled 2018-12-06 (×6): qty 1

## 2018-12-06 MED ORDER — CAMPHOR-MENTHOL 0.5-0.5 % EX LOTN
TOPICAL_LOTION | CUTANEOUS | Status: DC | PRN
  Administered 2018-12-07 – 2018-12-09 (×3): via TOPICAL
  Filled 2018-12-06 (×2): qty 222

## 2018-12-06 NOTE — Progress Notes (Addendum)
NAME:  Rhonda Lynch, MRN:  [id number], DOB:  1955/10/04, LOS: 3 ADMISSION DATE:  12/03/2018, CONSULTATION DATE:  12/03/2018 REFERRING MD:  Dr. Alvino Chapel, CHIEF COMPLAINT:  SOB  Brief History   63 yoF ESRD TTS presenting with nausea, vomiting, abdominal pain, and slight SOB for ~ 3 days.  Last iHD -partial iHD on 3/21.  Found hypoxic requiring NRB, hypertensive, temp 100.5 rectal, with small amount of hematemesis in ER.  CXR c/w with bilateral infiltrates likely pulmonary edema.   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   Of note, patient recently admitted 3/8 -3/13 with bilateral lung infiltrates thought to be probable pulmonary edema vs pneumonia in the setting of missed dialysis , treated with iHD and antibiotics.  She was cocaine positive.  She was again admitted 3/17 - 3/18 with AMS thought related to overuse/ likely unintentional cocaine overdose.   Patient reports having partial iHD on 3/21.  Denies sick contacts, fever, known COVID -19 exposure, travel, or recent drug use.  She was found by EMS hypoxic with sats in the 80's.  In ER, she remained hypertensive, hypoxic, rectal temp 100.5. CXR showed bilateral infiltrates. Flu neg.  Labs noted for K 5.4, glucose 120, BUN 83, sCr 13.17, WBC 14.1, Hgb 10.7, Lactic 2.4, trop I 0.18 and BNP >4500, EKG non acute. 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  Received Plex, pulse dose steroids with prednisone taper and p.o. Cytoxan at that time.  She is no longer on immunosuppression.  Significant Hospital Events   3/8- 3/13 admit 3/17 -3/18 admit 3/25 Admit  Consults:  Nephrology  Procedures:   Significant Diagnostic Tests:  3/25 CT chest >>  3/25 CT abd/ pelvis >> 3/25 UDS >> positive for cocaine  Micro Data:  3/25 BCx 2 >> 3/25 UC >> 3/25 RVP >> negative 3/25 novel coronavirus >>   Antimicrobials:  3/25 vanc x1  3/25 flagyl x 1 3/25 cefepime x1 3//26 Zosyn >>   Interim history/subjective:  She received HD last night and tolerated well.  Chest x-ray on my review still has some mild interstitial infiltrates but certainly improved compared with her presentation chest x-ray Awake, complaining of a little bit of abdominal pain and nausea   Objective   Blood pressure (!) 108/58, pulse 91, temperature 99.2 F (37.3 C), temperature source Oral, resp. rate 20, height 5\' 2"  (1.575 m), weight 56.6 kg, SpO2 99 %.    Vent Mode: PSV;CPAP FiO2 (%):  [40 %] 40 % Set Rate:  [28 bmp] 28 bmp Vt Set:  [300 mL] 300 mL PEEP:  [5 cmH20] 5 cmH20 Pressure Support:  [5 cmH20-8 cmH20] 5 cmH20 Plateau Pressure:  [11 cmH20-16 cmH20] 16 cmH20   Intake/Output Summary (Last 24 hours) at 12/06/2018 1811 Last data filed at 12/06/2018 0900 Gross per 24 hour  Intake 810.83 ml  Output -  Net 810.83 ml   [employer]  12/03/18 2130 12/06/18 0026 12/06/18 0305  Weight: 60.1 kg 56.6 kg 56.6 kg   Examination: Gen: Acute and chronically ill-appearing woman, intubated, awake Neuro: Wide-awake, able to nod to questions, follow commands moves all extremities. HEENT: ET tube is in good position Neck: Right subclavian tunneled dialysis catheter Lungs: Breath sounds improved, few mild inspiratory crackles CV: Regular, no murmur Abd: Soft, nondistended, no significant tenderness, no rebound Ext: Trace lower extremity edema, 1+ upper  extremity edema  Resolved Hospital Problem list    Assessment & Plan:  Acute hypoxic respiratory failure Bilateral infiltrates-consistent with acute pulmonary edema, cocaine injury. -Suspicion for infectious process either bacterial or viral is low Hemoptysis, blood-tinged secretions P:  Tolerating SBT.  Plan for extubation today Follow chest x-ray Volume removal with hemodialysis, next treatment likely 3/29 Likely DC HCAP coverage in the next 24 hours assuming her infiltrates resolve Exposure to COVID, on airborne isolation and testing is pending  Hypertensive Urgency, resolved, now hypotensive on sedation - setting of ?cocaine abuse, non-compliance, and missed iHD P:  Restart Norvasc 3/28 Avoid beta-blockers in setting of cocaine abuse Continue Midodrin, added 3/26   ESRD on TTS iHD Mild hyperkalemia P:  Appreciate nephrology assistance. Hemodialysis again on 3/29 then she can likely go out of the ICU  DMT2  P:  Sliding-scale insulin sensitive scale Lantus on hold until taking good nutrition  Substance abuse Severe medical non-compliance  P:  Ongoing counseling Given this is her 3rd admit this month, consider PMT consult given ongoing noncompliance   N/V, small amount hematemesis at time of presentation - no diarrhea - CT abdomen without any abnormality -Hemoccult positive P:  PPI twice daily  Chronic Normocytic Anemia P:  Follow CBC Follow for any evidence of recurrent bleeding  Trichomonas, noted on UA P: Treated with Flagyl, completed   Best practice:  Diet: Start diet if extubated successfully Pain/Anxiety/Delirium protocol (if indicated): n/a VAP protocol (if indicated): n/a DVT prophylaxis: heparin SQ/ SCDs GI prophylaxis: PPI BID Glucose control: SSI Mobility: BR Code Status: Full  Family Communication: Discussed with patient.  Will call family 3/29 Disposition: ICU  Labs   CBC: Recent Labs  Lab 12/03/18 1208  12/03/18 2333 12/04/18  0451 12/04/18 1207 12/05/18 0422 12/06/18 0244  WBC 14.1*  --   --  11.0*  --  7.7 8.3  NEUTROABS 10.8*  --   --   --   --   --   --   HGB 10.7*   < > 13.9 7.8* 8.2* 8.2* 8.1*  HCT 36.2   < > 41.0 25.0* 24.0* 27.3* 25.3*  MCV 81.3  --   --  79.4*  --  80.3 78.8*  PLT 235  --   --  150  --  189 187   < > = values in this interval not displayed.    Basic Metabolic Panel: Recent Labs  Lab 12/03/18 1208  12/03/18 2333 12/04/18 0310 12/04/18 1207 12/05/18 0422 12/06/18 0244  NA 138   < > 137 137 136 138 135  K 5.4*   < > 3.6 4.2 4.3 4.1 3.3*  CL 105  --   --  102  --  101 100  CO2 18*  --   --  23  --  23 25  GLUCOSE 120*  --   --  129*  --  128* 92  BUN 83*  --   --  27*  --  60* 27*  CREATININE 13.17*  --   --  6.54*  --  8.78* 5.19*  CALCIUM 8.8*  --   --  7.9*  --  8.1* 8.1*  MG  --   --   --   --   --  2.1 1.9  PHOS  --   --   --   --   --   --  4.2   < > = values in this interval not displayed.   GFR: Estimated Creatinine Clearance: 8.5 mL/min (A) (by C-G formula based on SCr of 5.19 mg/dL (H)). Recent Labs  Lab 12/03/18 1208 12/03/18 1250 12/03/18 1519 12/04/18 0451 12/05/18 0422 12/06/18 0244  WBC 14.1*  --   --  11.0* 7.7 8.3  LATICACIDVEN  --  2.4* 2.3*  --   --   --     Liver Function Tests: Recent Labs  Lab 12/03/18 1208 12/04/18 0310 12/05/18 0422  AST 43* 32 24  ALT 21 16 15   ALKPHOS 63 53 60  BILITOT 0.9 0.8 0.8  PROT 7.4 5.6* 6.5  ALBUMIN 2.6* 2.0* 2.0*   Recent Labs  Lab 12/03/18 1208 12/05/18 0422  LIPASE 33 29   No results for input(s): AMMONIA in the last 168 hours.  ABG    Component Value Date/Time   PHART 7.409 12/04/2018 1207   PCO2ART 45.0 12/04/2018 1207   PO2ART 142.0 (H) 12/04/2018 1207   HCO3 28.5 (H) 12/04/2018 1207   TCO2 30 12/04/2018 1207   ACIDBASEDEF 9.0 (H) 11/25/2018 0451   O2SAT 99.0 12/04/2018 1207     Coagulation Profile: No results for input(s): INR, PROTIME in the last 168 hours.  Cardiac Enzymes:  Recent Labs  Lab 12/03/18 1223  TROPONINI 0.18*    HbA1C: Hgb A1c MFr Bld  Date/Time Value Ref Range Status  07/19/2018 05:34 AM 6.1 (H) 4.8 - 5.6 % Final    Comment:    (NOTE) Pre diabetes:          5.7%-6.4% Diabetes:              >6.4% Glycemic control for   <7.0% adults with diabetes   04/02/2018 02:45 PM 6.8 (H) 4.8 - 5.6 % Final    Comment:    (NOTE) Pre diabetes:          5.7%-6.4% Diabetes:              >6.4% Glycemic control for   <7.0% adults with diabetes     CBG: Recent Labs  Lab 12/05/18 2351 12/06/18 0313 12/06/18 0846 12/06/18 1114 12/06/18 1600  GLUCAP 89 91 85 94 88     Critical care time: 34 mins    Baltazar Apo, MD, PhD 12/06/2018, 6:11 PM [facility] [phone] or if no answer [phone]

## 2018-12-06 NOTE — Procedures (Signed)
Extubation Procedure Note  Patient Details:   Name: Rhonda Lynch DOB: [birth year] MRN: [id number]   Airway Documentation:    Vent end date: 12/06/18 Vent end time: 1246   Evaluation  O2 sats: stable throughout Complications: No apparent complications Patient did tolerate procedure well. Bilateral Breath Sounds: Clear, Diminished   Yes   Positive cuff leak noted.  Pt placed on [place] 4 L with humidity, no stridor noted.  Pt able to reach 750 mL using incentive spirometer.  Mingo Amber Irven Ingalsbe 12/06/2018, 1:06 PM

## 2018-12-06 NOTE — Progress Notes (Signed)
[name], female   DOB: [birth year], 63 y.o.   MRN: [id number] S:Tolerated HD with 2.6 liters UF yesterday.  Improved ventialation.  No events overnight O:BP (!) 150/74   Pulse 72   Temp 98.7 F (37.1 C) (Oral)   Resp 11   Ht _0  (1.575 m) Comment: measured at bedside  Wt 56.6 kg   SpO2 100%   BMI 22.82 kg/m   Intake/Output Summary (Last 24 hours) at 12/06/2018 1004 Last data filed at 12/06/2018 0900 Gross per 24 hour  Intake 984.57 ml  Output 2654 ml  Net -1669.43 ml   Intake/Output: I/O last 3 completed shifts: In: 1511.9 [I.V.:435.3; NG/GT:965.7; IV Piggyback:110.9] Out: 2654 [Other:2654]  Intake/Output this shift:  Total I/O In: 43.6 [I.V.:23.6; NG/GT:20] Out: -  Weight change:  Gen: intubated and resting comfortably Full-contact physical exam was not possible due to potential covid-19 infection and utilizing key physical exam findings from Dr. Agustina Caroli exam.  Recent Labs  Lab 12/03/18 1208 12/03/18 2146 12/03/18 2333 12/04/18 0310 12/04/18 1207 12/05/18 0422 12/06/18 0244  NA 138 138 137 137 136 138 135  K 5.4* 3.5 3.6 4.2 4.3 4.1 3.3*  CL 105  --   --  102  --  101 100  CO2 18*  --   --  23  --  23 25  GLUCOSE 120*  --   --  129*  --  128* 92  BUN 83*  --   --  27*  --  60* 27*  CREATININE 13.17*  --   --  6.54*  --  8.78* 5.19*  ALBUMIN 2.6*  --   --  2.0*  --  2.0*  --   CALCIUM 8.8*  --   --  7.9*  --  8.1* 8.1*  PHOS  --   --   --   --   --   --  4.2  AST 43*  --   --  32  --  24  --   ALT 21  --   --  16  --  15  --    Liver Function Tests: Recent Labs  Lab 12/03/18 1208 12/04/18 0310 12/05/18 0422  AST 43* 32 24  ALT _1 ALKPHOS 63 53 60  BILITOT 0.9 0.8 0.8  PROT 7.4 5.6* 6.5  ALBUMIN 2.6* 2.0* 2.0*   Recent Labs  Lab 12/03/18 1208 12/05/18 0422  LIPASE 33 29   No results for input(s): AMMONIA in the last 168 hours. CBC: Recent Labs  Lab 12/03/18 1208  12/04/18 0451 12/04/18 1207 12/05/18 0422  12/06/18 0244  WBC 14.1*  --  11.0*  --  7.7 8.3  NEUTROABS 10.8*  --   --   --   --   --   HGB 10.7*   < > 7.8* 8.2* 8.2* 8.1*  HCT 36.2   < > 25.0* 24.0* 27.3* 25.3*  MCV 81.3  --  79.4*  --  80.3 78.8*  PLT 235  --  150  --  189 187   < > = values in this interval not displayed.   Cardiac Enzymes: Recent Labs  Lab 12/03/18 1223  TROPONINI 0.18*   CBG: Recent Labs  Lab 12/05/18 1520 12/05/18 1930 12/05/18 2351 12/06/18 0313 12/06/18 0846  GLUCAP 104* 97 89 91 85    Iron Studies: No results for input(s): IRON, TIBC, TRANSFERRIN, FERRITIN in the last 72 hours. Studies/Results: Dg Chest Port 1 [address]  Result Date: 12/06/2018 CLINICAL DATA:  Acute respiratory failure with hypoxia EXAM: PORTABLE CHEST 1 VIEW COMPARISON:  12/05/2018 FINDINGS: Endotracheal tube terminates 3 cm above the carina. Multifocal patchy right lung opacities, suspicious for pneumonia. Right basilar opacity is mildly improved. Faint interstitial/airspace opacities in the left upper lobe, unchanged. No definite pleural effusions. No pneumothorax. Cardiomegaly. Enteric tube courses into the mid gastric body. Right IJ dual lumen dialysis catheter terminates at the cavoatrial junction. IMPRESSION: Endotracheal tube terminates 3 cm above the carina. Additional support apparatus as above. Multifocal right lung opacities, lower lobe predominant, suspicious for pneumonia. Superimposed interstitial/airspace opacities may reflect mild interstitial edema. No definite pleural effusions. Electronically Signed   By: Julian Hy M.D.   On: 12/06/2018 07:35   Dg Chest Port 1 View  Result Date: 12/05/2018 CLINICAL DATA:  Hypoxia EXAM: PORTABLE CHEST 1 VIEW COMPARISON:  December 04, 2018 FINDINGS: Endotracheal tube tip is 2.1 cm above the carina. Nasogastric tube tip and side port are below the diaphragm. Side port is seen in the stomach. Central catheter tip is in the superior vena cava. No pneumothorax. There is interstitial  and patchy airspace opacity throughout the lungs, more severe on the right than on the left. There has been partial clearing of airspace opacity on the right and left sides compared to 1 day prior. No new opacity evident. Heart remains enlarged with mild pulmonary venous hypertension. No adenopathy. There is aortic atherosclerosis. No bone lesions. IMPRESSION: Tube and catheter positions as described without pneumothorax. Areas of interstitial and airspace opacity bilaterally, more severe on the right than on the left, with mild partial clearing bilaterally. The appearance raises question of a degree of ARDS. No new opacity evident. Underlying pulmonary vascular congestion noted. Aortic Atherosclerosis (ICD10-I70.0). Electronically Signed   By: Lowella Grip III M.D.   On: 12/05/2018 07:20   . chlorhexidine gluconate (MEDLINE KIT)  15 mL Mouth Rinse BID  . Chlorhexidine Gluconate Cloth  6 each Topical Q0600  . doxercalciferol  4 mcg Intravenous Q M,W,F-HD  . feeding supplement (PRO-STAT SUGAR FREE 64)  30 mL Oral BID  . heparin  5,000 Units Subcutaneous Q8H  . insulin aspart  0-9 Units Subcutaneous Q4H  . mouth rinse  15 mL Mouth Rinse 10 times per day  . midodrine  10 mg Per Tube Q8H  . pantoprazole (PROTONIX) IV  40 mg Intravenous Q12H    BMET    Component Value Date/Time   NA 135 12/06/2018 0244   NA 135 03/25/2018 1014   K 3.3 (L) 12/06/2018 0244   CL 100 12/06/2018 0244   CO2 25 12/06/2018 0244   GLUCOSE 92 12/06/2018 0244   BUN 27 (H) 12/06/2018 0244   BUN 96 (HH) 03/25/2018 1014   CREATININE 5.19 (H) 12/06/2018 0244   CALCIUM 8.1 (L) 12/06/2018 0244   CALCIUM 8.3 (L) 09/23/2018 1329   GFRNONAA 8 (L) 12/06/2018 0244   GFRAA 9 (L) 12/06/2018 0244   CBC    Component Value Date/Time   WBC 8.3 12/06/2018 0244   RBC 3.21 (L) 12/06/2018 0244   HGB 8.1 (L) 12/06/2018 0244   HGB 8.0 (L) 03/27/2018 1107   HCT 25.3 (L) 12/06/2018 0244   HCT 25.3 (L) 03/27/2018 1107   PLT 187  12/06/2018 0244   PLT 126 (L) 03/27/2018 1107   MCV 78.8 (L) 12/06/2018 0244   MCV 83 03/27/2018 1107   MCH 25.2 (L) 12/06/2018 0244   MCHC 32.0 12/06/2018 0244   RDW 20.5 (  H) 12/06/2018 0244   RDW 19.9 (H) 03/27/2018 1107   LYMPHSABS 2.3 12/03/2018 1208   LYMPHSABS 0.5 (L) 03/27/2018 1107   MONOABS 0.9 12/03/2018 1208   EOSABS 0.0 12/03/2018 1208   EOSABS 0.0 03/27/2018 1107   BASOSABS 0.1 12/03/2018 1208   BASOSABS 0.0 03/27/2018 1107    Dialysis Orders: [place], TueThuSat, 4 hrs 0 min, 180NRe Optiflux, BFR 400, DFR Manual 800 mL/min, EDW 58.5 (kg), Dialysate 2.0 K, 2.5 Ca, 1.0 Mg, 100 Dextrose (G2251), Sodium 137 (mEq/L), Bicarb Setting: 35 (mEq/L), UFR Profile: None, Sodium Model: None, Access: CVCatheter-Tunneled,and left AVGG placed 2/17 Hectorol 4, Mircera 100 w 2 week - did get 150 3/21 - last hgb 10.9 3/21  Assessment/Plan:  1. Acute hypoxic respiratory failure with fever and hemoptysis- ruling out for Covid-19.  CT scan c/w pulmonary edema  1. + contact to covid-19+ person which raises her risk 2. Resend novel coronavirus per Dr. Lamonte Sakai 3. Hopeful extubation later today. 2. ESRD- off schedule and will plan on HD again on Monday.  Will get back on schedule later next week.   3. HTN/volume- UF with HD 4. Anemia of CKD-  hgb dropping.  Follow and transfuse prn. Will redose ESA 5. Vascular access- LAVG placed 2/17 and will use on next HD session on Monday.  Still has TDC in place and will remove if AVG is successful next week. 6. Substance abuse- + cocaine 7. H/o BOOP and ANCA vasculitis  Donetta Potts, MD [facility] [phone]

## 2018-12-07 ENCOUNTER — Inpatient Hospital Stay (HOSPITAL_COMMUNITY): Payer: Medicare Other

## 2018-12-07 LAB — BASIC METABOLIC PANEL
Anion gap: 10 (ref 5–15)
Anion gap: 11 (ref 5–15)
BUN: 27 mg/dL — ABNORMAL HIGH (ref 8–23)
BUN: 42 mg/dL — ABNORMAL HIGH (ref 8–23)
CO2: 25 mmol/L (ref 22–32)
CO2: 24 mmol/L (ref 22–32)
Calcium: 8.1 mg/dL — ABNORMAL LOW (ref 8.9–10.3)
Calcium: 8.5 mg/dL — ABNORMAL LOW (ref 8.9–10.3)
Chloride: 100 mmol/L (ref 98–111)
Chloride: 101 mmol/L (ref 98–111)
Creatinine, Ser: 5.19 mg/dL — ABNORMAL HIGH (ref 0.44–1.00)
Creatinine, Ser: 7.8 mg/dL — ABNORMAL HIGH (ref 0.44–1.00)
GFR calc Af Amer: 9 mL/min — ABNORMAL LOW (ref >60)
GFR calc Af Amer: 6 mL/min — ABNORMAL LOW (ref >60)
GFR calc non Af Amer: 8 mL/min — ABNORMAL LOW (ref >60)
GFR calc non Af Amer: 5 mL/min — ABNORMAL LOW (ref >60)
Glucose, Bld: 92 mg/dL (ref 70–99)
Glucose, Bld: 89 mg/dL (ref 70–99)
POTASSIUM: 3.3 mmol/L — AB (ref 3.5–5.1)
Potassium: 3.6 mmol/L (ref 3.5–5.1)
Sodium: 135 mmol/L (ref 135–145)
Sodium: 136 mmol/L (ref 135–145)

## 2018-12-07 LAB — C DIFFICILE QUICK SCREEN W PCR REFLEX
C Diff antigen: POSITIVE — AB
C Diff toxin: NEGATIVE

## 2018-12-07 LAB — MAGNESIUM: Magnesium: 2.1 mg/dL (ref 1.7–2.4)

## 2018-12-07 LAB — CBC
HCT: 28.9 % — ABNORMAL LOW (ref 36.0–46.0)
Hemoglobin: 9 g/dL — ABNORMAL LOW (ref 12.0–15.0)
MCH: 24.9 pg — ABNORMAL LOW (ref 26.0–34.0)
MCHC: 31.1 g/dL (ref 30.0–36.0)
MCV: 79.8 fL — ABNORMAL LOW (ref 80.0–100.0)
Platelets: 245 10*3/uL (ref 150–400)
RBC: 3.62 MIL/uL — ABNORMAL LOW (ref 3.87–5.11)
RDW: 20.4 % — ABNORMAL HIGH (ref 11.5–15.5)
WBC: 9.3 10*3/uL (ref 4.0–10.5)
nRBC: 0.2 % (ref 0.0–0.2)

## 2018-12-07 LAB — GLUCOSE, CAPILLARY
Glucose-Capillary: 107 mg/dL — ABNORMAL HIGH (ref 70–99)
Glucose-Capillary: 107 mg/dL — ABNORMAL HIGH (ref 70–99)
Glucose-Capillary: 131 mg/dL — ABNORMAL HIGH (ref 70–99)
Glucose-Capillary: 80 mg/dL (ref 70–99)
Glucose-Capillary: 81 mg/dL (ref 70–99)
Glucose-Capillary: 84 mg/dL (ref 70–99)

## 2018-12-07 LAB — CLOSTRIDIUM DIFFICILE BY PCR, REFLEXED: Toxigenic C. Difficile by PCR: POSITIVE — AB

## 2018-12-07 MED ORDER — PANTOPRAZOLE SODIUM 40 MG PO TBEC
40.0000 mg | DELAYED_RELEASE_TABLET | Freq: Two times a day (BID) | ORAL | Status: DC
  Administered 2018-12-07 – 2018-12-08 (×2): 40 mg via ORAL
  Filled 2018-12-07 (×2): qty 1

## 2018-12-07 MED ORDER — HEPARIN SODIUM (PORCINE) 1000 UNIT/ML DIALYSIS
1000.0000 [IU] | INTRAMUSCULAR | Status: DC | PRN
  Administered 2018-12-07: 1000 [IU] via INTRAVENOUS_CENTRAL

## 2018-12-07 MED ORDER — LIDOCAINE-PRILOCAINE 2.5-2.5 % EX CREA
1.0000 "application " | TOPICAL_CREAM | CUTANEOUS | Status: DC | PRN
  Filled 2018-12-07: qty 5

## 2018-12-07 MED ORDER — NEPRO/CARBSTEADY PO LIQD
237.0000 mL | Freq: Two times a day (BID) | ORAL | Status: DC
  Administered 2018-12-07 – 2018-12-09 (×3): 237 mL via ORAL
  Filled 2018-12-07 (×11): qty 237

## 2018-12-07 MED ORDER — BOOST / RESOURCE BREEZE PO LIQD CUSTOM
1.0000 | ORAL | Status: DC
  Administered 2018-12-08 – 2018-12-10 (×2): 1 via ORAL
  Filled 2018-12-07 (×3): qty 1

## 2018-12-07 MED ORDER — VANCOMYCIN 50 MG/ML ORAL SOLUTION
125.0000 mg | Freq: Four times a day (QID) | ORAL | Status: DC
  Administered 2018-12-07 – 2018-12-12 (×16): 125 mg via ORAL
  Filled 2018-12-07 (×23): qty 2.5

## 2018-12-07 MED ORDER — HYOSCYAMINE SULFATE ER 0.375 MG PO TB12
0.3750 mg | ORAL_TABLET | Freq: Two times a day (BID) | ORAL | Status: DC | PRN
  Administered 2018-12-07 – 2018-12-11 (×6): 0.375 mg via ORAL
  Filled 2018-12-07 (×9): qty 1

## 2018-12-07 MED ORDER — SODIUM CHLORIDE 0.9 % IV SOLN: 100.0000 mL | INTRAVENOUS | Status: DC | PRN

## 2018-12-07 MED ORDER — ALTEPLASE 2 MG IJ SOLR
2.0000 mg | Freq: Once | INTRAMUSCULAR | Status: DC | PRN
  Filled 2018-12-07: qty 2

## 2018-12-07 MED ORDER — LIDOCAINE HCL (PF) 1 % IJ SOLN: 5.0000 mL | INTRAMUSCULAR | Status: DC | PRN

## 2018-12-07 MED ORDER — PENTAFLUOROPROP-TETRAFLUOROETH EX AERO: 1.0000 "application " | INHALATION_SPRAY | CUTANEOUS | Status: DC | PRN

## 2018-12-07 NOTE — Progress Notes (Signed)
[name], female   DOB: Apr 04, 1956, 63 y.o.   MRN: [id number] S:Extubated yesterday but concern for pulmonary edema  O:BP (!) 169/98   Pulse 92   Temp 98.9 F (37.2 C) (Oral)   Resp (!) 22   Ht 5\' 2"  (1.575 m) Comment: measured at bedside  Wt 57.6 kg   SpO2 98%   BMI 23.23 kg/m   Intake/Output Summary (Last 24 hours) at 12/07/2018 0959 Last data filed at 12/07/2018 0900 Gross per 24 hour  Intake 216.58 ml  Output -  Net 216.58 ml   Intake/Output: I/O last 3 completed shifts: In: 997.7 [I.V.:214.1; NG/GT:615.7; IV Piggyback:168] Out: -   Intake/Output this shift:  Total I/O In: 24.9 [IV Piggyback:24.9] Out: -  Weight change: 1 kg Full-contact physical exam was not possible due to potential covid-19 infection and utilizing key physical exam findings from Dr. Agustina Caroli exam.  Recent Labs  Lab 12/03/18 1208 12/03/18 2146 12/03/18 2333 12/04/18 0310 12/04/18 1207 12/05/18 0422 12/06/18 0244 12/07/18 0457  NA 138 138 137 137 136 138 135 136  K 5.4* 3.5 3.6 4.2 4.3 4.1 3.3* 3.6  CL 105  --   --  102  --  101 100 101  CO2 18*  --   --  23  --  23 25 24   GLUCOSE 120*  --   --  129*  --  128* 92 89  BUN 83*  --   --  27*  --  60* 27* 42*  CREATININE 13.17*  --   --  6.54*  --  8.78* 5.19* 7.80*  ALBUMIN 2.6*  --   --  2.0*  --  2.0*  --   --   CALCIUM 8.8*  --   --  7.9*  --  8.1* 8.1* 8.5*  PHOS  --   --   --   --   --   --  4.2  --   AST 43*  --   --  32  --  24  --   --   ALT 21  --   --  16  --  15  --   --    Liver Function Tests: Recent Labs  Lab 12/03/18 1208 12/04/18 0310 12/05/18 0422  AST 43* 32 24  ALT 21 16 15   ALKPHOS 63 53 60  BILITOT 0.9 0.8 0.8  PROT 7.4 5.6* 6.5  ALBUMIN 2.6* 2.0* 2.0*   Recent Labs  Lab 12/03/18 1208 12/05/18 0422  LIPASE 33 29   No results for input(s): AMMONIA in the last 168 hours. CBC: Recent Labs  Lab 12/03/18 1208  12/04/18 0451  12/05/18 0422 12/06/18 0244 12/07/18 0457  WBC 14.1*  --  11.0*   --  7.7 8.3 9.3  NEUTROABS 10.8*  --   --   --   --   --   --   HGB 10.7*   < > 7.8*   < > 8.2* 8.1* 9.0*  HCT 36.2   < > 25.0*   < > 27.3* 25.3* 28.9*  MCV 81.3  --  79.4*  --  80.3 78.8* 79.8*  PLT 235  --  150  --  189 187 245   < > = values in this interval not displayed.   Cardiac Enzymes: Recent Labs  Lab 12/03/18 1223  TROPONINI 0.18*   CBG: Recent Labs  Lab 12/06/18 1600 12/06/18 1946 12/07/18 0022 12/07/18 0503 12/07/18 0910  GLUCAP 88 121*  81 84 80    Iron Studies: No results for input(s): IRON, TIBC, TRANSFERRIN, FERRITIN in the last 72 hours. Studies/Results: Dg Chest Port 1 View  Result Date: 12/07/2018 CLINICAL DATA:  Acute respiratory failure EXAM: PORTABLE CHEST 1 VIEW COMPARISON:  12/06/2018 FINDINGS: Cardiomegaly with mild interstitial edema. Mild patchy right basilar opacity, atelectasis versus pneumonia, grossly unchanged. No definite pleural effusions. No pneumothorax. Right IJ dual lumen dialysis catheter terminates at the cavoatrial junction. Interval extubation and removal of enteric tube. IMPRESSION: Interval extubation and removal of enteric tube. Cardiomegaly with mild interstitial edema. No definite pleural effusions. Mild patchy right basilar opacity, atelectasis versus pneumonia, unchanged. Electronically Signed   By: Julian Hy M.D.   On: 12/07/2018 05:36   Dg Chest Port 1 View  Result Date: 12/06/2018 CLINICAL DATA:  Acute respiratory failure with hypoxia EXAM: PORTABLE CHEST 1 VIEW COMPARISON:  12/05/2018 FINDINGS: Endotracheal tube terminates 3 cm above the carina. Multifocal patchy right lung opacities, suspicious for pneumonia. Right basilar opacity is mildly improved. Faint interstitial/airspace opacities in the left upper lobe, unchanged. No definite pleural effusions. No pneumothorax. Cardiomegaly. Enteric tube courses into the mid gastric body. Right IJ dual lumen dialysis catheter terminates at the cavoatrial junction. IMPRESSION:  Endotracheal tube terminates 3 cm above the carina. Additional support apparatus as above. Multifocal right lung opacities, lower lobe predominant, suspicious for pneumonia. Superimposed interstitial/airspace opacities may reflect mild interstitial edema. No definite pleural effusions. Electronically Signed   By: Julian Hy M.D.   On: 12/06/2018 07:35   . amLODipine  5 mg Oral Daily  . Chlorhexidine Gluconate Cloth  6 each Topical Q0600  . doxercalciferol  4 mcg Intravenous Q M,W,F-HD  . feeding supplement (PRO-STAT SUGAR FREE 64)  30 mL Oral BID  . heparin  5,000 Units Subcutaneous Q8H  . insulin aspart  0-9 Units Subcutaneous Q4H  . mouth rinse  15 mL Mouth Rinse BID  . midodrine  10 mg Per Tube Q8H  . pantoprazole (PROTONIX) IV  40 mg Intravenous Q12H    BMET    Component Value Date/Time   NA 136 12/07/2018 0457   NA 135 03/25/2018 1014   K 3.6 12/07/2018 0457   CL 101 12/07/2018 0457   CO2 24 12/07/2018 0457   GLUCOSE 89 12/07/2018 0457   BUN 42 (H) 12/07/2018 0457   BUN 96 (HH) 03/25/2018 1014   CREATININE 7.80 (H) 12/07/2018 0457   CALCIUM 8.5 (L) 12/07/2018 0457   CALCIUM 8.3 (L) 09/23/2018 1329   GFRNONAA 5 (L) 12/07/2018 0457   GFRAA 6 (L) 12/07/2018 0457   CBC    Component Value Date/Time   WBC 9.3 12/07/2018 0457   RBC 3.62 (L) 12/07/2018 0457   HGB 9.0 (L) 12/07/2018 0457   HGB 8.0 (L) 03/27/2018 1107   HCT 28.9 (L) 12/07/2018 0457   HCT 25.3 (L) 03/27/2018 1107   PLT 245 12/07/2018 0457   PLT 126 (L) 03/27/2018 1107   MCV 79.8 (L) 12/07/2018 0457   MCV 83 03/27/2018 1107   MCH 24.9 (L) 12/07/2018 0457   MCHC 31.1 12/07/2018 0457   RDW 20.4 (H) 12/07/2018 0457   RDW 19.9 (H) 03/27/2018 1107   LYMPHSABS 2.3 12/03/2018 1208   LYMPHSABS 0.5 (L) 03/27/2018 1107   MONOABS 0.9 12/03/2018 1208   EOSABS 0.0 12/03/2018 1208   EOSABS 0.0 03/27/2018 1107   BASOSABS 0.1 12/03/2018 1208   BASOSABS 0.0 03/27/2018 1107   Dialysis Orders: [place], TueThuSat, 4  hrs 0 min,  180NRe Optiflux, BFR 400, DFR Manual 800 mL/min, EDW 58.5 (kg), Dialysate 2.0 K, 2.5 Ca, 1.0 Mg, 100 Dextrose (G2251), Sodium 137 (mEq/L), Bicarb Setting: 35 (mEq/L), UFR Profile: None, Sodium Model: None, Access: CVCatheter-Tunneled,and left AVGG placed 2/17 Hectorol 4, Mircera 100 w 2 week - did get 150 3/21 - last hgb 10.9 3/21  Assessment/Plan:  1. Acute hypoxic respiratory failure with fever and hemoptysis- ruling out for Covid-19. CT scan c/w pulmonary edema  1. + contact to covid-19+ person which raises her risk 2. Resend novel coronavirus per Dr. Lamonte Sakai 3. Plan for HD with UF today to help prevent re-intubation. 2. ESRD- off schedule and will plan on HD again on Monday or Tuesday. Will get back on schedule later next week.   3. HTN/volume- UF with HD 4. Anemia of CKD- hgb dropping. Follow and transfuse prn. Will redose ESA 5. Vascular access- LAVG placed 2/17 and will use on next HD session on Monday.  Still has TDC in place and will remove if AVG is successful next week. 6. Substance abuse- + cocaine 7. H/o BOOP and ANCA vasculitis  Donetta Potts, MD [facility] [phone]

## 2018-12-07 NOTE — Progress Notes (Signed)
eLink Physician-Brief Progress Note Patient Name: Rhonda Lynch DOB: 1956-06-26 MRN: [id number]   Date of Service  12/07/2018  HPI/Events of Note  Patient complaining of abdominal pain described as cramping  eICU Interventions  Ordered hyoscyamine q12 prn for cramps     Intervention Category Intermediate Interventions: Pain - evaluation and management  Judd Lien 12/07/2018, 10:05 PM

## 2018-12-07 NOTE — Progress Notes (Signed)
Pharmacy Antibiotic Note  Rhonda Lynch is a 63 y.o. female admitted on 12/03/2018 with possible sepsis requiring intubation, concern for pneumonia. Patient is also ESRD on HD with a history of noncompliance. COVID-19 test currently pending. Pharmacy has been consulted for Zosyn dosing. Currently AF, WBC WNL. No growth on cultures.  Plan: Continue Zosyn 3.375 g IV q12h F/u clinical status, HD schedule/tolerance, C&S, LOT, de-escalation  Height: 5\' 2"  (157.5 cm)(measured at bedside) Weight: 126 lb 15.8 oz (57.6 kg) IBW/kg (Calculated) : 50.1  Temp (24hrs), Avg:98.8 F (37.1 C), Min:98.6 F (37 C), Max:99.2 F (37.3 C)  Recent Labs  Lab 12/03/18 1208 12/03/18 1250 12/03/18 1519 12/04/18 0310 12/04/18 0451 12/05/18 0422 12/06/18 0244 12/07/18 0457  WBC 14.1*  --   --   --  11.0* 7.7 8.3 9.3  CREATININE 13.17*  --   --  6.54*  --  8.78* 5.19* 7.80*  LATICACIDVEN  --  2.4* 2.3*  --   --   --   --   --     Estimated Creatinine Clearance: 5.7 mL/min (A) (by C-G formula based on SCr of 7.8 mg/dL (H)).    Allergies  Allergen Reactions  . Acetaminophen Nausea And Vomiting    Patient states Acetaminophen and Acetaminophen containing products make her nauseated. She demonstrated this 06/21/18 with nausea followed by emesis.  Marland Kitchen Hydroxyzine Itching    Antimicrobials this admission: Zosyn 3/26>> Vanc 3/25 x1  Microbiology results: 3/25 COVID19: IP 3/25 Flu/RVP: neg 3/25 Bcx: IP 3/27 COVID19: IP  Thank you for allowing pharmacy to be a part of this patient's care.  Mila Merry Gerarda Fraction, PharmD, [place] PGY2 Infectious Diseases Pharmacy Resident Phone: [phone] 12/07/2018     10:37 AM

## 2018-12-07 NOTE — Progress Notes (Signed)
NAME:  Rhonda Lynch, MRN:  [id number], DOB:  24-Aug-1956, LOS: 4 ADMISSION DATE:  12/03/2018, CONSULTATION DATE:  12/03/2018 REFERRING MD:  Dr. Alvino Chapel, CHIEF COMPLAINT:  SOB  Brief History   8 yoF ESRD TTS presenting with nausea, vomiting, abdominal pain, and slight SOB for ~ 3 days.  Last iHD -partial iHD on 3/21.  Found hypoxic requiring NRB, hypertensive, temp 100.5 rectal, with small amount of hematemesis in ER.  CXR c/w with bilateral infiltrates likely pulmonary edema.   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   Of note, patient recently admitted 3/8 -3/13 with bilateral lung infiltrates thought to be probable pulmonary edema vs pneumonia in the setting of missed dialysis , treated with iHD and antibiotics.  She was cocaine positive.  She was again admitted 3/17 - 3/18 with AMS thought related to overuse/ likely unintentional cocaine overdose.   Patient reports having partial iHD on 3/21.  Denies sick contacts, fever, known COVID -19 exposure, travel, or recent drug use.  She was found by EMS hypoxic with sats in the 80's.  In ER, she remained hypertensive, hypoxic, rectal temp 100.5. CXR showed bilateral infiltrates. Flu neg.  Labs noted for K 5.4, glucose 120, BUN 83, sCr 13.17, WBC 14.1, Hgb 10.7, Lactic 2.4, trop I 0.18 and BNP >4500, EKG non acute. 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  Received Plex, pulse dose steroids with prednisone taper and p.o. Cytoxan at that time.  She is no longer on immunosuppression.  Significant Hospital Events   3/8- 3/13 admit 3/17 -3/18 admit 3/25 Admit  Consults:  Nephrology  Procedures:   Significant Diagnostic Tests:  3/25 CT chest >>  3/25 CT abd/ pelvis >> 3/25 UDS >> positive for cocaine  Micro Data:  3/25 BCx 2 >> 3/25 UC >> 3/25 RVP >> negative 3/25 novel coronavirus >>   Antimicrobials:  3/25 vanc x1  3/25 flagyl x 1 3/25 cefepime x1 3//26 Zosyn >>   Interim history/subjective:  Some occasional agitation, seems to correlate with hemodialysis Having some loose stools Chest x-ray with persistent interstitial infiltrates, reviewed by me   Objective   Blood pressure (!) 144/75, pulse (!) 104, temperature 99.3 F (37.4 C), temperature source Oral, resp. rate (!) 29, height 5\' 2"  (1.575 m), weight 54.4 kg, SpO2 100 %.        Intake/Output Summary (Last 24 hours) at 12/07/2018 2055 Last data filed at 12/07/2018 1900 Gross per 24 hour  Intake 73.1 ml  Output 3000 ml  Net -2926.9 ml   Filed Weights   12/07/18 0100 12/07/18 1119 12/07/18 1505  Weight: 57.6 kg 57.5 kg 54.4 kg   Examination: Gen: Acute and chronically ill-appearing woman, intubated, awake Neuro: Wide-awake, able to nod to questions, follow commands moves all extremities. HEENT: ET tube is in good position Neck: Right subclavian tunneled dialysis catheter Lungs: Breath sounds improved, few mild inspiratory crackles CV:  Regular, no murmur Abd: Soft, nondistended, no significant tenderness, no rebound Ext: Trace lower extremity edema, 1+ upper extremity edema  Resolved Hospital Problem list    Assessment & Plan:  Acute hypoxic respiratory failure Bilateral infiltrates-consistent with acute pulmonary edema, cocaine injury. -Suspicion for infectious process either bacterial or viral is low Hemoptysis, blood-tinged secretions P:   Extubated successfully post dialysis on 3/28 Repeat dialysis today Follow chest x-ray for clearance of her infiltrates COVID-19 testing remains pending  Hypertensive Urgency, resolved, now hypotensive on sedation - setting of ?cocaine abuse, non-compliance, and missed iHD P:  Continue Norvasc Avoid beta-blockers in setting of cocaine abuse Continue Midodrin, added 3/26   ESRD on TTS iHD Mild hyperkalemia P:  Appreciate nephrology assistance. Hemodialysis again on 3/29   Diarrhea, at risk C. difficile colitis Check C. difficile and initiate therapy if positive  DMT2  P:  Sliding-scale insulin sensitive scale Lantus on hold until taking good nutrition  Substance abuse Severe medical non-compliance  P:  Ongoing counseling Given this is her 3rd admit this month, consider PMT consult given ongoing noncompliance   Chronic Normocytic Anemia P:  Follow CBC Follow for any evidence of recurrent bleeding  Trichomonas, noted on UA P: Treated with Flagyl, completed   Best practice:  Diet: Start diet Pain/Anxiety/Delirium protocol (if indicated): n/a VAP protocol (if indicated): n/a DVT prophylaxis: heparin SQ/ SCDs GI prophylaxis: PPI BID Glucose control: SSI Mobility: BR Code Status: Full  Family Communication: Discussed with patient.  Disposition: ICU  Labs   CBC: Recent Labs  Lab 12/03/18 1208  12/04/18 0451 12/04/18 1207 12/05/18 0422 12/06/18 0244 12/07/18 0457  WBC 14.1*  --  11.0*  --  7.7 8.3 9.3  NEUTROABS 10.8*  --   --   --   --   --   --   HGB 10.7*   < > 7.8* 8.2* 8.2* 8.1* 9.0*  HCT 36.2   < > 25.0* 24.0* 27.3* 25.3* 28.9*  MCV 81.3  --  79.4*  --  80.3 78.8* 79.8*  PLT 235  --  150  --  189 187 245   < > = values in this interval not displayed.    Basic Metabolic Panel: Recent Labs  Lab 12/03/18 1208  12/04/18 0310 12/04/18 1207 12/05/18 0422 12/06/18 0244 12/07/18 0457  NA 138   < > 137 136 138 135 136  K 5.4*   < > 4.2 4.3 4.1  3.3* 3.6  CL 105  --  102  --  101 100 101  CO2 18*  --  23  --  23 25 24   GLUCOSE 120*  --  129*  --  128* 92 89  BUN 83*  --  27*  --  60* 27* 42*  CREATININE 13.17*  --  6.54*  --  8.78* 5.19* 7.80*  CALCIUM 8.8*  --  7.9*  --  8.1* 8.1* 8.5*  MG  --   --   --   --  2.1 1.9 2.1  PHOS  --   --   --   --   --  4.2  --    < > = values in this interval not displayed.   GFR: Estimated Creatinine Clearance: 5.7 mL/min (A) (by C-G formula based on SCr of 7.8 mg/dL (H)). Recent Labs  Lab 12/03/18 1250 12/03/18 1519 12/04/18 0451 12/05/18 0422 12/06/18 0244 12/07/18 0457  WBC  --   --  11.0* 7.7 8.3 9.3  LATICACIDVEN 2.4* 2.3*  --   --   --   --  Liver Function Tests: Recent Labs  Lab 12/03/18 1208 12/04/18 0310 12/05/18 0422  AST 43* 32 24  ALT 21 16 15   ALKPHOS 63 53 60  BILITOT 0.9 0.8 0.8  PROT 7.4 5.6* 6.5  ALBUMIN 2.6* 2.0* 2.0*   Recent Labs  Lab 12/03/18 1208 12/05/18 0422  LIPASE 33 29   No results for input(s): AMMONIA in the last 168 hours.  ABG    Component Value Date/Time   PHART 7.409 12/04/2018 1207   PCO2ART 45.0 12/04/2018 1207   PO2ART 142.0 (H) 12/04/2018 1207   HCO3 28.5 (H) 12/04/2018 1207   TCO2 30 12/04/2018 1207   ACIDBASEDEF 9.0 (H) 11/25/2018 0451   O2SAT 99.0 12/04/2018 1207     Coagulation Profile: No results for input(s): INR, PROTIME in the last 168 hours.  Cardiac Enzymes: Recent Labs  Lab 12/03/18 1223  TROPONINI 0.18*    HbA1C: Hgb A1c MFr Bld  Date/Time Value Ref Range Status  07/19/2018 05:34 AM 6.1 (H) 4.8 - 5.6 % Final    Comment:    (NOTE) Pre diabetes:          5.7%-6.4% Diabetes:              >6.4% Glycemic control for   <7.0% adults with diabetes   04/02/2018 02:45 PM 6.8 (H) 4.8 - 5.6 % Final    Comment:    (NOTE) Pre diabetes:          5.7%-6.4% Diabetes:              >6.4% Glycemic control for   <7.0% adults with diabetes     CBG: Recent Labs  Lab 12/07/18 0503 12/07/18 0910 12/07/18  1248 12/07/18 1522 12/07/18 2035  GLUCAP 84 80 107* 107* 131*     Critical care time: 31 mins    Baltazar Apo, MD, PhD 12/07/2018, 8:55 PM [facility] [phone] or if no answer [phone]

## 2018-12-07 NOTE — Progress Notes (Signed)
Nutrition Note  RD was paged by RN regarding supplement options for patient. Pt was extubated 3/28. Was receiving Nepro @ 20 ml/hr with 30 ml Prostat BID.  RN felt that patient would benefit from nutritional supplementation now that she is on a full liquid diet. RD to place order for Nepro shake BID and Boost Breeze daily.  If further nutrition issues arise, please contact RD.   Clayton Bibles, MS, RD, [facility] Dietitian Pager: [phone] After Hours Pager: [phone]

## 2018-12-08 ENCOUNTER — Inpatient Hospital Stay (HOSPITAL_COMMUNITY): Payer: Medicare Other

## 2018-12-08 DIAGNOSIS — N186 End stage renal disease: Secondary | ICD-10-CM

## 2018-12-08 DIAGNOSIS — J810 Acute pulmonary edema: Secondary | ICD-10-CM

## 2018-12-08 DIAGNOSIS — R0902 Hypoxemia: Secondary | ICD-10-CM

## 2018-12-08 LAB — CULTURE, BLOOD (ROUTINE X 2)
CULTURE: NO GROWTH
Culture: NO GROWTH

## 2018-12-08 LAB — BASIC METABOLIC PANEL
Anion gap: 13 (ref 5–15)
BUN: 13 mg/dL (ref 8–23)
CALCIUM: 8.5 mg/dL — AB (ref 8.9–10.3)
CO2: 23 mmol/L (ref 22–32)
Chloride: 98 mmol/L (ref 98–111)
Creatinine, Ser: 4.3 mg/dL — ABNORMAL HIGH (ref 0.44–1.00)
GFR calc Af Amer: 12 mL/min — ABNORMAL LOW (ref >60)
GFR, EST NON AFRICAN AMERICAN: 10 mL/min — AB (ref >60)
GLUCOSE: 94 mg/dL (ref 70–99)
Potassium: 3.5 mmol/L (ref 3.5–5.1)
Sodium: 134 mmol/L — ABNORMAL LOW (ref 135–145)

## 2018-12-08 LAB — CBC
HCT: 28.6 % — ABNORMAL LOW (ref 36.0–46.0)
Hemoglobin: 8.8 g/dL — ABNORMAL LOW (ref 12.0–15.0)
MCH: 24.6 pg — ABNORMAL LOW (ref 26.0–34.0)
MCHC: 30.8 g/dL (ref 30.0–36.0)
MCV: 80.1 fL (ref 80.0–100.0)
PLATELETS: 245 10*3/uL (ref 150–400)
RBC: 3.57 MIL/uL — ABNORMAL LOW (ref 3.87–5.11)
RDW: 20.3 % — ABNORMAL HIGH (ref 11.5–15.5)
WBC: 8.2 10*3/uL (ref 4.0–10.5)
nRBC: 0.2 % (ref 0.0–0.2)

## 2018-12-08 LAB — MAGNESIUM: Magnesium: 1.9 mg/dL (ref 1.7–2.4)

## 2018-12-08 LAB — GLUCOSE, CAPILLARY
GLUCOSE-CAPILLARY: 89 mg/dL (ref 70–99)
Glucose-Capillary: 111 mg/dL — ABNORMAL HIGH (ref 70–99)
Glucose-Capillary: 82 mg/dL (ref 70–99)
Glucose-Capillary: 91 mg/dL (ref 70–99)
Glucose-Capillary: 108 mg/dL — ABNORMAL HIGH (ref 70–99)
Glucose-Capillary: 113 mg/dL — ABNORMAL HIGH (ref 70–99)

## 2018-12-08 LAB — NOVEL CORONAVIRUS, NAA (HOSP ORDER, SEND-OUT TO REF LAB; TAT 18-24 HRS): SARS-CoV-2, NAA: NOT DETECTED

## 2018-12-08 MED ORDER — CHLORHEXIDINE GLUCONATE CLOTH 2 % EX PADS
6.0000 | MEDICATED_PAD | Freq: Every day | CUTANEOUS | Status: DC
  Administered 2018-12-10 – 2018-12-11 (×2): 6 via TOPICAL

## 2018-12-08 MED ORDER — TRAMADOL HCL 50 MG PO TABS
50.0000 mg | ORAL_TABLET | Freq: Once | ORAL | Status: AC | PRN
  Administered 2018-12-08: 50 mg via ORAL
  Filled 2018-12-08: qty 1

## 2018-12-08 MED ORDER — INSULIN ASPART 100 UNIT/ML ~~LOC~~ SOLN
0.0000 [IU] | Freq: Three times a day (TID) | SUBCUTANEOUS | Status: DC
  Administered 2018-12-09 – 2018-12-10 (×3): 1 [IU] via SUBCUTANEOUS
  Administered 2018-12-11: 3 [IU] via SUBCUTANEOUS

## 2018-12-08 MED ORDER — PANTOPRAZOLE SODIUM 40 MG PO TBEC
40.0000 mg | DELAYED_RELEASE_TABLET | Freq: Every day | ORAL | Status: DC
  Administered 2018-12-09: 40 mg via ORAL
  Filled 2018-12-08: qty 1

## 2018-12-08 MED ORDER — INSULIN ASPART 100 UNIT/ML ~~LOC~~ SOLN: 0.0000 [IU] | Freq: Every day | SUBCUTANEOUS | Status: DC

## 2018-12-08 MED ORDER — CLONAZEPAM 1 MG PO TABS
1.0000 mg | ORAL_TABLET | Freq: Once | ORAL | Status: AC | PRN
  Administered 2018-12-08: 1 mg via ORAL
  Filled 2018-12-08: qty 1

## 2018-12-08 MED ORDER — CLONAZEPAM 1 MG PO TABS
1.0000 mg | ORAL_TABLET | Freq: Once | ORAL | Status: AC
  Administered 2018-12-08: 1 mg via ORAL
  Filled 2018-12-08: qty 1

## 2018-12-08 NOTE — Progress Notes (Signed)
Nutrition Follow-up RD working remotely.  DOCUMENTATION CODES:   Not applicable  INTERVENTION:    Continue to offer Nepro Shake po BID, each supplement provides 425 kcal and 19 grams protein OR Boost Breeze po BID, each supplement provides 250 kcal and 9 grams of protein  NUTRITION DIAGNOSIS:   Inadequate oral intake related to decreased appetite as evidenced by meal completion < 50%.  Ongoing   GOAL:   Patient will meet greater than or equal to 90% of their needs  Progressing  MONITOR:   PO intake, Supplement acceptance, Skin, I & O's  ASSESSMENT:   63 yo female with PMH of substance abuse, HTN, DM, stroke, ESRD d/t ANCA vasculitis - on HD, hepatitis C, and noncompliance who was admitted with pulmonary edema r/t missed HD. Recent admissions 3/8-3/13 (pulmonary edema vs PNA) and 3/17-3/18 (AMS r/t cocaine overdose). R/O Covid-19, test pending.  Extubated 3/28.  COVID-19 test results are still pending.  Diet advanced to full liquids, then heart healthy today. She has been consuming 50% of full liquid meals. Nepro shake ordered BID and Breeze ordered once daily, patient refused Nepro today, but drank the [employer].    Weight is trending down with fluid removal from HD. Now below EDW.  EDW was 58.5 kg, weight down to 54.1 kg today.  Labs reviewed. Sodium 134 (L), creatinine 4.3 (H), potassium WDL, phosphorus WDL on 3/28 CBG's: 111-89 Medications reviewed.   Diet Order:   Diet Order            Diet Heart Room service appropriate? Yes; Fluid consistency: Thin  Diet effective now              EDUCATION NEEDS:   No education needs have been identified at this time  Skin:  Skin Assessment: (MASD to coccyx & sacrum)  Last BM:  3/30 (type 6)  Height:   Ht Readings from Last 1 Encounters:  12/03/18 5\' 2"  (1.575 m)    Weight:   Wt Readings from Last 1 Encounters:  12/08/18 54.1 kg    Ideal Body Weight:  50 kg  BMI:  Body mass index is 21.81  kg/m.  Estimated Nutritional Needs:   Kcal:  1650-1900  Protein:  75-85 gm  Fluid:  1 L + UOP     Molli Barrows, RD, LDN, [place] Pager [phone] After Hours Pager [phone]

## 2018-12-08 NOTE — Progress Notes (Signed)
eLink Physician-Brief Progress Note Patient Name: Rhonda Lynch DOB: 03-14-1956 MRN: [id number]   Date of Service  12/08/2018  HPI/Events of Note  Patient c/o crampy abdominal pain. Noted improvement with Klonopin earlier today.  eICU Interventions  Will order: 1. Klonopin 1 mg PO now.  2. Please give already ordered Levbid.      Intervention Category Major Interventions: Other:  Lysle Dingwall 12/08/2018, 8:26 PM

## 2018-12-08 NOTE — Progress Notes (Signed)
Talked with Dr.Summers about pt's abd pain. Will write new orders for Klonopin 1mg  x1 and give the [place]pine for abd cramping. Call if pain doesn't go away or tolerable and will send a doctor to check the abd.

## 2018-12-08 NOTE — Final Progress Note (Signed)
Patient transferred from 2 M ICU to 2 West 12. Patient alert oriented, on 2L [place]. No c/o pain at this time. Patient in bed locked at lower level, bedside table, call light and telephone within patient reach. Will continue yo monitor patient.

## 2018-12-08 NOTE — Progress Notes (Signed)
[name], female   DOB: 1956-07-29, 63 y.o.   MRN: [id number] S: -3 L yest and total 8.2L off w/ HD x 3 here.  Wt's down 64 >> 54kg today, dry wt is 58.5kg.  CXR gradually improving w/ dialysis. Still some diffuse mild pattern.   O:BP (!) 154/81   Pulse 77   Temp 98.6 F (37 C) (Oral)   Resp 18   Ht 5\' 2"  (1.575 m) Comment: measured at bedside  Wt 54.1 kg   SpO2 100%   BMI 21.81 kg/m   Intake/Output Summary (Last 24 hours) at 12/08/2018 1200 Last data filed at 12/08/2018 0900 Gross per 24 hour  Intake 249.77 ml  Output 3000 ml  Net -2750.23 ml   Intake/Output: I/O last 3 completed shifts: In: 312.8 [P.O.:150; IV Piggyback:162.8] Out: 3000 [Other:3000]  Intake/Output this shift:  Total I/O In: 14 [IV Piggyback:14] Out: -  Weight change: -0.1 kg Full-contact physical exam was not possible due to potential covid-19 infection, utilizing key physical exam findings from Dr. Agustina Caroli exam.  [facility] T[facility]  4h   400800   58.5kg  2/2.5 bath  TDC + L AVG placed 2/17 - hect 4  - mircera 100 every 2 wks, last 3/21   Assessment/Plan: 1. Acute hypoxic respiratory failure w/ fever and hemoptysis- + contact to covid-19+ person which raises her risk. COVID-19 test results pending. CT showed diffuse infiltrates. CXR improving w/ vol removal, down 10kg from admission 64 > 54kg today.  2. ESRD- TTS HD. 3-4 kg under dry wt now, losing body wt. Bp's good, cont to lower edw as tolerated w/ next HD tomorrow.  3. HTN - bp's stable 4. Anemia of CKD- hgb dropping. Follow and transfuse prn. Will give darbe today sq then weekly on Tuesday darbe 100 5. Vascular access- LAVG placed 2/17 and will use on next HD session on Tuesday. Still has TDC in place and will remove if AVG is successful next week. 6. Substance abuse- + cocaine 7. 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 No longer on immunosuppressants.   [facility] 12/08/2018, 12:15  PM       Recent Labs  Lab 12/03/18 1208  12/03/18 2333 12/04/18 0310 12/04/18 1207 12/05/18 0422 12/06/18 0244 12/07/18 0457 12/08/18 0339  NA 138   < > 137 137 136 138 135 136 134*  K 5.4*   < > 3.6 4.2 4.3 4.1 3.3* 3.6 3.5  CL 105  --   --  102  --  101 100 101 98  CO2 18*  --   --  23  --  23 25 24 23   GLUCOSE 120*  --   --  129*  --  128* 92 89 94  BUN 83*  --   --  27*  --  60* 27* 42* 13  CREATININE 13.17*  --   --  6.54*  --  8.78* 5.19* 7.80* 4.30*  ALBUMIN 2.6*  --   --  2.0*  --  2.0*  --   --   --   CALCIUM 8.8*  --   --  7.9*  --  8.1* 8.1* 8.5* 8.5*  PHOS  --   --   --   --   --   --  4.2  --   --   AST 43*  --   --  32  --  24  --   --   --  ALT 21  --   --  16  --  15  --   --   --    < > = values in this interval not displayed.   Liver Function Tests: Recent Labs  Lab 12/03/18 1208 12/04/18 0310 12/05/18 0422  AST 43* 32 24  ALT 21 16 15   ALKPHOS 63 53 60  BILITOT 0.9 0.8 0.8  PROT 7.4 5.6* 6.5  ALBUMIN 2.6* 2.0* 2.0*   Recent Labs  Lab 12/03/18 1208 12/05/18 0422  LIPASE 33 29   No results for input(s): AMMONIA in the last 168 hours. CBC: Recent Labs  Lab 12/03/18 1208  12/04/18 0451  12/05/18 0422 12/06/18 0244 12/07/18 0457 12/08/18 0339  WBC 14.1*  --  11.0*  --  7.7 8.3 9.3 8.2  NEUTROABS 10.8*  --   --   --   --   --   --   --   HGB 10.7*   < > 7.8*   < > 8.2* 8.1* 9.0* 8.8*  HCT 36.2   < > 25.0*   < > 27.3* 25.3* 28.9* 28.6*  MCV 81.3  --  79.4*  --  80.3 78.8* 79.8* 80.1  PLT 235  --  150  --  189 187 245 245   < > = values in this interval not displayed.   Cardiac Enzymes: Recent Labs  Lab 12/03/18 1223  TROPONINI 0.18*   CBG: Recent Labs  Lab 12/07/18 1248 12/07/18 1522 12/07/18 2035 12/08/18 0102 12/08/18 0325  GLUCAP 107* 107* 131* 111* 89    Iron Studies: No results for input(s): IRON, TIBC, TRANSFERRIN, FERRITIN in the last 72 hours. Studies/Results: Dg Chest Port 1 View  Result Date:  12/08/2018 CLINICAL DATA:  Respiratory failure EXAM: PORTABLE CHEST 1 VIEW COMPARISON:  12/07/2018 FINDINGS: Cardiac shadow remains enlarged. Dialysis catheter remains in place. Aortic calcifications are noted. Lungs are well aerated bilaterally with diffuse interstitial changes similar to that seen on the prior exam. Stable right basilar opacity is noted. No sizable effusion is seen. No bony abnormality is noted. IMPRESSION: Stable interstitial changes and right basilar opacity when compared with the prior exam. No new focal abnormality is noted. Electronically Signed   By: Inez Catalina M.D.   On: 12/08/2018 07:45   Dg Chest Port 1 View  Result Date: 12/07/2018 CLINICAL DATA:  Acute respiratory failure EXAM: PORTABLE CHEST 1 VIEW COMPARISON:  12/06/2018 FINDINGS: Cardiomegaly with mild interstitial edema. Mild patchy right basilar opacity, atelectasis versus pneumonia, grossly unchanged. No definite pleural effusions. No pneumothorax. Right IJ dual lumen dialysis catheter terminates at the cavoatrial junction. Interval extubation and removal of enteric tube. IMPRESSION: Interval extubation and removal of enteric tube. Cardiomegaly with mild interstitial edema. No definite pleural effusions. Mild patchy right basilar opacity, atelectasis versus pneumonia, unchanged. Electronically Signed   By: Julian Hy M.D.   On: 12/07/2018 05:36   . amLODipine  5 mg Oral Daily  . Chlorhexidine Gluconate Cloth  6 each Topical Q0600  . doxercalciferol  4 mcg Intravenous Q M,W,F-HD  . feeding supplement  1 Container Oral Q24H  . feeding supplement (NEPRO CARB STEADY)  237 mL Oral BID BM  . heparin  5,000 Units Subcutaneous Q8H  . insulin aspart  0-9 Units Subcutaneous Q4H  . mouth rinse  15 mL Mouth Rinse BID  . [START ON 12/09/2018] pantoprazole  40 mg Oral Daily  . vancomycin  125 mg Oral QID    BMET  Component Value Date/Time   NA 134 (L) 12/08/2018 0339   NA 135 03/25/2018 1014   K 3.5 12/08/2018  0339   CL 98 12/08/2018 0339   CO2 23 12/08/2018 0339   GLUCOSE 94 12/08/2018 0339   BUN 13 12/08/2018 0339   BUN 96 (HH) 03/25/2018 1014   CREATININE 4.30 (H) 12/08/2018 0339   CALCIUM 8.5 (L) 12/08/2018 0339   CALCIUM 8.3 (L) 09/23/2018 1329   GFRNONAA 10 (L) 12/08/2018 0339   GFRAA 12 (L) 12/08/2018 0339   CBC    Component Value Date/Time   WBC 8.2 12/08/2018 0339   RBC 3.57 (L) 12/08/2018 0339   HGB 8.8 (L) 12/08/2018 0339   HGB 8.0 (L) 03/27/2018 1107   HCT 28.6 (L) 12/08/2018 0339   HCT 25.3 (L) 03/27/2018 1107   PLT 245 12/08/2018 0339   PLT 126 (L) 03/27/2018 1107   MCV 80.1 12/08/2018 0339   MCV 83 03/27/2018 1107   MCH 24.6 (L) 12/08/2018 0339   MCHC 30.8 12/08/2018 0339   RDW 20.3 (H) 12/08/2018 0339   RDW 19.9 (H) 03/27/2018 1107   LYMPHSABS 2.3 12/03/2018 1208   LYMPHSABS 0.5 (L) 03/27/2018 1107   MONOABS 0.9 12/03/2018 1208   EOSABS 0.0 12/03/2018 1208   EOSABS 0.0 03/27/2018 1107   BASOSABS 0.1 12/03/2018 1208   BASOSABS 0.0 03/27/2018 1107

## 2018-12-08 NOTE — Progress Notes (Signed)
Patient very fidgety all night. Constant calling on call bell. Constant BM's and putting her hand in it by accident. Patient requesting to get up out of bed. Constantly taking off gown and yelling out. Patient states her legs are tingling and she cant sit still. Elink paged. Order for Klonopin received.   Patient also complained of stomach pain. Hyoscyamine given and pt received no relief. 1x dose of tramadol then given. Minimal relief   Will continue to monitor

## 2018-12-08 NOTE — Progress Notes (Addendum)
NAME:  Rhonda Lynch, MRN:  [id number], DOB:  06/10/1956, LOS: 5 ADMISSION DATE:  12/03/2018, CONSULTATION DATE:  12/03/2018 REFERRING MD:  Dr. Alvino Chapel, CHIEF COMPLAINT:  SOB  Brief History   63 yoF ESRD TTS presenting with nausea, vomiting, abdominal pain, and slight SOB for ~ 3 days.  Last iHD -partial iHD on 3/21.  Found hypoxic requiring NRB, hypertensive, temp 100.5 rectal, with small amount of hematemesis in ER.  CXR c/w with bilateral infiltrates likely pulmonary edema.   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   Of note, patient recently admitted 3/8 -3/13 with bilateral lung infiltrates thought to be probable pulmonary edema vs pneumonia in the setting of missed dialysis , treated with iHD and antibiotics.  She was cocaine positive.  She was again admitted 3/17 - 3/18 with AMS thought related to overuse/ likely unintentional cocaine overdose.   Patient reports having partial iHD on 3/21.  Denies sick contacts, fever, known COVID -19 exposure, travel, or recent drug use.  She was found by EMS hypoxic with sats in the 80's.  In ER, she remained hypertensive, hypoxic, rectal temp 100.5. CXR showed bilateral infiltrates. Flu neg.  Labs noted for K 5.4, glucose 120, BUN 83, sCr 13.17, WBC 14.1, Hgb 10.7, Lactic 2.4, trop I 0.18 and BNP >4500, EKG non acute. 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  Received Plex, pulse dose steroids with prednisone taper and p.o. Cytoxan at that time.  She is no longer on immunosuppression.  Significant Hospital Events   3/8- 3/13 admit 3/17 -3/18 admit 3/25 Admit, intubated 3/28 Extubated 3/29 C.Diff positive    Consults:  Nephrology  Procedures:  3/25 ETT> 3/28  Significant Diagnostic Tests:  3/25 CT chest >>  3/25 CT abd/ pelvis >> 3/25 UDS >> positive for cocaine  3/30 CXR  -R basilar opacity, interstitial lung changes-stable compared to prior   Micro Data:  3/25 BCx 2 >> 3/25 UC >> 3/25 RVP >> negative 3/25 novel coronavirus >> (pending) 3/27 novel coronavirus, resent>> (pending)  3/29 C.Diff> Positive   Antimicrobials:  3/25 vanc x1  3/25 flagyl x 1 3/25 cefepime x1 3//26 Zosyn >>  3/29 vanc 125 QID for C.Diff   Interim history/subjective:  Drowsy , HDS, no respiratory distress Did not sleep well last night Continues high output BM   Objective   Blood pressure (!) 158/71, pulse 75, temperature 98.6 F (37 C), temperature source Oral, resp. rate 15, height 5\' 2"  (1.575 m), weight 54.1 kg, SpO2 100 %.        Intake/Output Summary (Last 24 hours) at 12/08/2018 0806 Last data filed at 12/08/2018 0700 Gross per 24 hour  Intake 262.55 ml  Output 3000 ml  Net -2737.45 ml   Filed Weights   12/07/18 1119 12/07/18 1505 12/08/18 0320  Weight: 57.5 kg 54.4 kg 54.1 kg   Examination: Gen: Acute on Chronically ill appearing female,  laying in bed, NAD  Neuro: Drowsy. Awakens to voice, following commands. PERRL  HEENT: NCAT, trachea midline, pink mmm  Lungs: 3LNC diminished bibasilar breath sounds. Symmetrical chest wall expansion, no accessory muscle recruitment  CV: RRR, s1s2,  no r/g/m  Abd: soft, round, ndnt to palpation. Hyperactive x4  Ext: Symmetrical bulk and tone, trace upper extremity edema.   Resolved Hospital Problem list   Hemoptysis, blood-tinged secretions  Hypertensive Urgency  hypotensive   Assessment & Plan:   Acute hypoxic respiratory failure Bilateral infiltrates-consistent with acute pulmonary edema, cocaine injury. -CXR 3/30 -R basilar opacity, interstitial lung changes-stable compared to prior  -Suspicion for infectious process either bacterial or viral is low  P:  Tolerating [place]  Titrate supplemental O2 for SpO2 SpO2 goal > 92%  PRN CXR  Volume removal with hemodialysis, next treatment likely 3/29 COVID exposure, continue airborne isolation and testing is pending Continue pip/tazo for HCAP   C.Diff P Continue Vanc  Continue enteric precautions  HTN - initial hypertensive urgency in setting of ?cocaine abuse, non-compliance, and missed iHD -subsequent hypotension after intubation, likely related to sedation -Now is  Hypertensive consistent with baseline  P:  Continue norvasc (re-started 3/28)  Avoid beta blockers in setting of cocaine abuse  PRN Hydralazine    ESRD -TTS iHD  -associated mild hyperkalemia  P:  Appreciate nephrology assistance. S/p iHD 4/54  Follow metabolic panel, treat electrolyte abnormalities PRN  Continue Midodrin, added 3/26 When able to advance diet, advance to renal diet  M/W/F hectorol   DMT2  P:  Sliding-scale insulin sensitive scale Lantus on hold until taking good nutrition  Substance abuse Severe medical non-compliance  P:  Ongoing counseling Given this is her 3rd admit this month, consider PMT consult given ongoing noncompliance   N/V, small amount hematemesis at time of presentation - CT abdomen without any abnormality -Hemoccult positive P:  Continue BID PPI   Chronic Normocytic Anemia P:  Continue to trend CBC  Follow for s/sx bleeding   Trichomonas, noted on UA P: -s/p flagyl course    Best practice:  Diet: Full liquid, when able to advance, advance to renal diet  Pain/Anxiety/Delirium protocol (if indicated): PRN fentanyl, PRN tramadol.  VAP protocol (if indicated): n/a DVT prophylaxis:  heparin SQ/ SCDs GI prophylaxis: PPI BID Glucose control: SSI Mobility: BR Code Status: Full  Family Communication: Discussed with patient  Disposition: Anticipate likely down-grade from ICU status to Progressive, will discuss with attending physician   Labs   CBC: Recent Labs  Lab 12/03/18 1208  12/04/18 0451 12/04/18 1207 12/05/18 0422 12/06/18 0244 12/07/18 0457 12/08/18 0339  WBC 14.1*  --  11.0*  --  7.7 8.3 9.3 8.2  NEUTROABS 10.8*  --   --   --   --   --   --   --   HGB 10.7*   < > 7.8* 8.2* 8.2* 8.1* 9.0* 8.8*  HCT 36.2   < > 25.0* 24.0* 27.3* 25.3* 28.9* 28.6*  MCV 81.3  --  79.4*  --  80.3 78.8* 79.8* 80.1  PLT 235  --  150  --  189 187 245 245   < > = values in this interval not displayed.    Basic Metabolic Panel: Recent Labs  Lab 12/04/18 0310 12/04/18 1207 12/05/18 0422 12/06/18 0244 12/07/18 0457 12/08/18 0339  NA 137 136 138 135 136 134*  K 4.2 4.3 4.1 3.3* 3.6 3.5  CL 102  --  101 100 101 98  CO2 23  --  23 25 24 23   GLUCOSE 129*  --  128* 92 89 94  BUN 27*  --  60* 27* 42* 13  CREATININE 6.54*  --  8.78* 5.19* 7.80* 4.30*  CALCIUM 7.9*  --  8.1* 8.1* 8.5* 8.5*  MG  --   --  2.1 1.9 2.1 1.9  PHOS  --   --   --  4.2  --   --    GFR: Estimated Creatinine Clearance: 10.3 mL/min (A) (by C-G formula based on SCr of 4.3 mg/dL (H)). Recent Labs  Lab 12/03/18 1250 12/03/18 1519  12/05/18 0422 12/06/18 0244 12/07/18 0457 12/08/18 0339  WBC  --   --    < > 7.7 8.3 9.3 8.2  LATICACIDVEN 2.4* 2.3*  --   --   --   --   --    < > = values in this interval not displayed.    Liver Function Tests: Recent Labs  Lab 12/03/18 1208 12/04/18 0310 12/05/18 0422  AST 43* 32 24  ALT 21 16 15   ALKPHOS 63 53 60  BILITOT 0.9 0.8 0.8  PROT 7.4 5.6* 6.5  ALBUMIN 2.6* 2.0* 2.0*   Recent Labs  Lab 12/03/18 1208 12/05/18 0422  LIPASE 33 29   No results for input(s): AMMONIA in the last 168 hours.  ABG    Component Value Date/Time   PHART 7.409  12/04/2018 1207   PCO2ART 45.0 12/04/2018 1207   PO2ART 142.0 (H) 12/04/2018 1207   HCO3 28.5 (H) 12/04/2018 1207   TCO2 30 12/04/2018 1207   ACIDBASEDEF 9.0 (H) 11/25/2018 0451   O2SAT 99.0 12/04/2018 1207     Coagulation Profile: No results for input(s): INR, PROTIME in the last 168 hours.  Cardiac Enzymes: Recent Labs  Lab 12/03/18 1223  TROPONINI 0.18*    HbA1C: Hgb A1c MFr Bld  Date/Time Value Ref Range Status  07/19/2018 05:34 AM 6.1 (H) 4.8 - 5.6 % Final    Comment:    (NOTE) Pre diabetes:          5.7%-6.4% Diabetes:              >6.4% Glycemic control for   <7.0% adults with diabetes   04/02/2018 02:45 PM 6.8 (H) 4.8 - 5.6 % Final    Comment:    (NOTE) Pre diabetes:          5.7%-6.4% Diabetes:              >6.4% Glycemic control for   <7.0% adults with diabetes     CBG: Recent Labs  Lab 12/07/18 1248 12/07/18 1522 12/07/18 2035 12/08/18 0102 12/08/18 0325  GLUCAP 107* 107* 131* 111* [place] MSN, AGACNP-BC [place] [phone] If no answer, [phone] 12/08/2018, 8:06 AM  Attending Note:  63 year old female with PMH of ESRD who presents after missing 3 dialysis sessions in respiratory failure.  Patient was dialyzed and fluid negative.  Extubated on 3/29.  No events overnight.  On exam, diminished BS diffusely.  I reviewed CXR myself, pulmonary edema noted.  Discussed with PCCM-NP and TRH-MD.  Acute respiratory failure:  - Monitor for airway protection  Hypoxemia:  - Titrate O2 for sat of 88-92%  - Ambulatory desat study for ?home O2 need prior to discharge  ESRD:  - HD per renal   - BMET in AM  - Replace electrolytes as indicated  C. Diff:  - PO vanc  Dispo:  - PT/OT  - OOB to chair  - Diet  - Transfer out of the ICU and to [facility] with PCCM off 3/31.  Patient seen and examined, agree with above note.  I dictated the care and orders written for this patient under my direction.  Rush Farmer, [place]

## 2018-12-08 NOTE — Progress Notes (Signed)
eLink Physician-Brief Progress Note Patient Name: TEPHANIE ESCORCIA DOB: 1955/10/10 MRN: [id number]   Date of Service  12/08/2018  HPI/Events of Note  Still complaining of abdominal pain despite spasmolytic. States she is allergic to tylenol and that it causes her to vomit.  eICU Interventions  Ordered one dose of Tramadol 50 mg PO.     Intervention Category Intermediate Interventions: Abdominal pain - evaluation and management  Shona Needles Marcellene Shivley 12/08/2018, 2:22 AM

## 2018-12-08 NOTE — Progress Notes (Signed)
eLink Physician-Brief Progress Note Patient Name: Rhonda Lynch DOB: 04-13-1956 MRN: [id number]   Date of Service  12/08/2018  HPI/Events of Note  Patient reportedly agitated, yelling at everyone  eICU Interventions  Ordered clonazepam 1 mg once for agitation and sleep     Intervention Category Minor Interventions: Agitation / anxiety - evaluation and management  Judd Lien 12/08/2018, 3:28 AM

## 2018-12-09 ENCOUNTER — Inpatient Hospital Stay (HOSPITAL_COMMUNITY): Payer: Medicare Other

## 2018-12-09 DIAGNOSIS — A0472 Enterocolitis due to Clostridium difficile, not specified as recurrent: Secondary | ICD-10-CM

## 2018-12-09 DIAGNOSIS — R1084 Generalized abdominal pain: Secondary | ICD-10-CM

## 2018-12-09 LAB — CBC
HCT: 29.3 % — ABNORMAL LOW (ref 36.0–46.0)
Hemoglobin: 8.9 g/dL — ABNORMAL LOW (ref 12.0–15.0)
MCH: 24.1 pg — ABNORMAL LOW (ref 26.0–34.0)
MCHC: 30.4 g/dL (ref 30.0–36.0)
MCV: 79.2 fL — ABNORMAL LOW (ref 80.0–100.0)
PLATELETS: 276 10*3/uL (ref 150–400)
RBC: 3.7 MIL/uL — ABNORMAL LOW (ref 3.87–5.11)
RDW: 20.3 % — ABNORMAL HIGH (ref 11.5–15.5)
WBC: 8.4 10*3/uL (ref 4.0–10.5)
nRBC: 0 % (ref 0.0–0.2)

## 2018-12-09 LAB — GLUCOSE, CAPILLARY
Glucose-Capillary: 110 mg/dL — ABNORMAL HIGH (ref 70–99)
Glucose-Capillary: 147 mg/dL — ABNORMAL HIGH (ref 70–99)
Glucose-Capillary: 80 mg/dL (ref 70–99)
Glucose-Capillary: 101 mg/dL — ABNORMAL HIGH (ref 70–99)

## 2018-12-09 LAB — NOVEL CORONAVIRUS, NAA (HOSP ORDER, SEND-OUT TO REF LAB; TAT 18-24 HRS): SARS-CoV-2, NAA: NOT DETECTED

## 2018-12-09 LAB — BASIC METABOLIC PANEL
ANION GAP: 14 (ref 5–15)
BUN: 24 mg/dL — ABNORMAL HIGH (ref 8–23)
CO2: 21 mmol/L — ABNORMAL LOW (ref 22–32)
Calcium: 8.9 mg/dL (ref 8.9–10.3)
Chloride: 99 mmol/L (ref 98–111)
Creatinine, Ser: 6.78 mg/dL — ABNORMAL HIGH (ref 0.44–1.00)
GFR calc Af Amer: 7 mL/min — ABNORMAL LOW (ref >60)
GFR calc non Af Amer: 6 mL/min — ABNORMAL LOW (ref >60)
Glucose, Bld: 93 mg/dL (ref 70–99)
Potassium: 3.9 mmol/L (ref 3.5–5.1)
Sodium: 134 mmol/L — ABNORMAL LOW (ref 135–145)

## 2018-12-09 MED ORDER — HEPARIN SODIUM (PORCINE) 1000 UNIT/ML IJ SOLN
INTRAMUSCULAR | Status: AC
  Filled 2018-12-09: qty 4

## 2018-12-09 MED ORDER — CALCITRIOL 0.25 MCG PO CAPS: 0.2500 ug | ORAL_CAPSULE | ORAL | Status: DC

## 2018-12-09 MED ORDER — TRAMADOL HCL 50 MG PO TABS
50.0000 mg | ORAL_TABLET | Freq: Three times a day (TID) | ORAL | Status: DC | PRN
  Administered 2018-12-09 – 2018-12-12 (×6): 50 mg via ORAL
  Filled 2018-12-09 (×7): qty 1

## 2018-12-09 MED ORDER — DARBEPOETIN ALFA 100 MCG/0.5ML IJ SOSY
100.0000 ug | PREFILLED_SYRINGE | INTRAMUSCULAR | Status: DC
  Administered 2018-12-09: 100 ug via INTRAVENOUS

## 2018-12-09 MED ORDER — FAMOTIDINE 20 MG PO TABS: 20.0000 mg | ORAL_TABLET | Freq: Two times a day (BID) | ORAL | Status: DC

## 2018-12-09 MED ORDER — FAMOTIDINE 20 MG PO TABS
10.0000 mg | ORAL_TABLET | Freq: Every day | ORAL | Status: DC
  Administered 2018-12-09 – 2018-12-12 (×3): 10 mg via ORAL
  Filled 2018-12-09 (×3): qty 1

## 2018-12-09 MED ORDER — SEVELAMER CARBONATE 800 MG PO TABS
1600.0000 mg | ORAL_TABLET | Freq: Three times a day (TID) | ORAL | Status: DC
  Administered 2018-12-09 – 2018-12-12 (×5): 1600 mg via ORAL
  Filled 2018-12-09 (×6): qty 2

## 2018-12-09 MED ORDER — DARBEPOETIN ALFA 100 MCG/0.5ML IJ SOSY
PREFILLED_SYRINGE | INTRAMUSCULAR | Status: AC
  Filled 2018-12-09: qty 0.5

## 2018-12-09 MED ORDER — CLONAZEPAM 0.5 MG PO TABS
0.5000 mg | ORAL_TABLET | Freq: Once | ORAL | Status: AC
  Administered 2018-12-09: 0.5 mg via ORAL
  Filled 2018-12-09: qty 1

## 2018-12-09 MED ORDER — FLUTICASONE PROPIONATE 50 MCG/ACT NA SUSP
2.0000 | Freq: Every day | NASAL | Status: DC
  Administered 2018-12-10 – 2018-12-12 (×2): 2 via NASAL
  Filled 2018-12-09: qty 16

## 2018-12-09 MED ORDER — LORAZEPAM 0.5 MG PO TABS
0.5000 mg | ORAL_TABLET | ORAL | Status: DC | PRN
  Administered 2018-12-09 – 2018-12-12 (×7): 0.5 mg via ORAL
  Filled 2018-12-09 (×8): qty 1

## 2018-12-09 MED ORDER — RENA-VITE PO TABS
1.0000 | ORAL_TABLET | Freq: Every day | ORAL | Status: DC
  Administered 2018-12-09 – 2018-12-11 (×3): 1 via ORAL
  Filled 2018-12-09 (×3): qty 1

## 2018-12-09 MED ORDER — FAMOTIDINE 20 MG PO TABS: 20.0000 mg | ORAL_TABLET | Freq: Every day | ORAL | Status: DC

## 2018-12-09 MED ORDER — FENTANYL CITRATE (PF) 100 MCG/2ML IJ SOLN
12.5000 ug | INTRAMUSCULAR | Status: DC | PRN
  Administered 2018-12-11: 25 ug via INTRAVENOUS
  Filled 2018-12-09: qty 2

## 2018-12-09 MED ORDER — DIALYVITE 800-ZINC 15 0.8 MG PO TABS: 1.0000 | ORAL_TABLET | Freq: Every day | ORAL | Status: DC

## 2018-12-09 NOTE — Progress Notes (Signed)
Went into pt's rm to start new IV, give 0600 meds, and give now dose of Klonopin and pt was sleeping soundly.

## 2018-12-09 NOTE — Progress Notes (Signed)
PT Cancellation Note  Patient Details Name: Rhonda Lynch MRN: [id number] DOB: 08-06-1956   Cancelled Treatment:    Reason Eval/Treat Not Completed: Per health system leadership, therapy services are being held at this time until patient tests negative for COVID-19.      Rhonda Lynch 12/09/2018, 11:51 AM   Rhonda Lynch, PT, DPT Acute Rehabilitation Services Pager: [phone] Office: [phone]

## 2018-12-09 NOTE — Progress Notes (Addendum)
Called back in the rm pt stated "there is blood everywhere." Went to check on pt and she had rolled over on her left side and disconnected the IV tubing from the needle cannula, tape off tubing, and opsite off of IV site. Site cleaned up and redressed. IV flushed and still working properly and drawing back blood. IV ATB resumed. Pad on bed changed and pt given new warm blankets. I went to tape the IV tubing further up the forearm to help prevent it from coming apart and pt yelled "If you touch my arm again I'm going to hit you." Pt has been demanding, hateful, and nasty all shift with this Probation officer and other staff members. Chair alarm put in recliner and pt assisted to recliner. Call light within reach.

## 2018-12-09 NOTE — Progress Notes (Signed)
eLink Physician-Brief Progress Note Patient Name: SHARONA ROVNER DOB: [birth year] MRN: [id number]   Date of Service  12/09/2018  HPI/Events of Note  Anxiety   eICU Interventions  Klonopin 0.5 mg PO X 1 now.      Intervention Category Major Interventions: Delirium, psychosis, severe agitation - evaluation and management  Cowen Pesqueira [place] 12/09/2018, 4:41 AM

## 2018-12-09 NOTE — Progress Notes (Signed)
IV RFA leaking and dc'd. When I touched pt's right hand to stablize it to start the IV pt yelled that I was holding her hand too tight. I explained I had to hold her hand to put the IV in and it wasn't my intention to hold it tight. Pt stated "if you touch my hand like that again I'm going to hit you." IV site stablized and taped with IV tubing for IVPB in 5 different areas and IV Opsite applied to site per protocol.

## 2018-12-09 NOTE — Progress Notes (Signed)
[name], female   DOB: 04-04-1956, 63 y.o.   MRN: [id number] S:  Covid test returned negative. CXR continues to improve daily. Wt's down 60-64 kg >> 54kg.  For HD today. Pt minimally interactive and not in a good mood.   O:BP (!) 157/101   Pulse 88   Temp 98.3 F (36.8 C) (Oral)   Resp 18   Ht 5\' 2"  (1.575 m) Comment: measured at bedside  Wt 54.1 kg   SpO2 96%   BMI 21.81 kg/m   Intake/Output Summary (Last 24 hours) at 12/09/2018 1442 Last data filed at 12/08/2018 2155 Gross per 24 hour  Intake 100 ml  Output -  Net 100 ml   Intake/Output: I/O last 3 completed shifts: In: 341.2 [P.O.:150; IV Piggyback:191.2] Out: -   Intake/Output this shift:  No intake/output data recorded. Weight change:  Alert, no distress  no jvd Chest occ rales scattered post Cor reg  Abd soft ntnd Ext no edema L AVG+bruit   [place] [facility]  [facility]   400800   58.5kg  2/2.5 bath  TDC + L AVG placed 2/17 - hect 4  - mircera 100 every 2 wks, last 3/21   Assessment/Plan: 1. Acute hypoxic respiratory failure/ pulm edema / fevers -  COVID-19 negative. CT showed diffuse infiltrates initially, now CXR clearing nicely which argues for volume overload as initial issue.  2. ESRD- TTS HD. 3-4 kg under prior dry wt, losing body wt. HD today. 3. Anemia of CKD- hgb 8- 10 here. Follow and transfuse prn. Will give darbe 100 ug q Tuesday  4. Vascular access- LAVG placed 2/17. When used will pull TDC. 5. Substance abuse- + cocaine 6. 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 No longer on immunosuppressants.   [facility] 12/09/2018, 2:42 PM       Recent Labs  Lab 12/03/18 1208  12/04/18 0310 12/04/18 1207 12/05/18 0422 12/06/18 0244 12/07/18 0457 12/08/18 0339 12/09/18 0500  NA 138   < > 137 136 138 135 136 134* 134*  K 5.4*   < > 4.2 4.3 4.1 3.3* 3.6 3.5 3.9  CL 105  --  102  --  101 100 101 98 99  CO2 18*  --  23  --  23 25 24 23  21*   GLUCOSE 120*  --  129*  --  128* 92 89 94 93  BUN 83*  --  27*  --  60* 27* 42* 13 24*  CREATININE 13.17*  --  6.54*  --  8.78* 5.19* 7.80* 4.30* 6.78*  ALBUMIN 2.6*  --  2.0*  --  2.0*  --   --   --   --   CALCIUM 8.8*  --  7.9*  --  8.1* 8.1* 8.5* 8.5* 8.9  PHOS  --   --   --   --   --  4.2  --   --   --   AST 43*  --  32  --  24  --   --   --   --   ALT 21  --  16  --  15  --   --   --   --    < > = values in this interval not displayed.   Liver Function Tests: Recent Labs  Lab 12/03/18 1208 12/04/18 0310 12/05/18 0422  AST 43* 32 24  ALT 21 16 15   ALKPHOS 63 53 60  BILITOT 0.9 0.8 0.8  PROT 7.4 5.6* 6.5  ALBUMIN 2.6* 2.0* 2.0*   Recent Labs  Lab 12/03/18 1208 12/05/18 0422  LIPASE 33 29   No results for input(s): AMMONIA in the last 168 hours. CBC: Recent Labs  Lab 12/03/18 1208  12/05/18 0422 12/06/18 0244 12/07/18 0457 12/08/18 0339 12/09/18 0500  WBC 14.1*   < > 7.7 8.3 9.3 8.2 8.4  NEUTROABS 10.8*  --   --   --   --   --   --   HGB 10.7*   < > 8.2* 8.1* 9.0* 8.8* 8.9*  HCT 36.2   < > 27.3* 25.3* 28.9* 28.6* 29.3*  MCV 81.3   < > 80.3 78.8* 79.8* 80.1 79.2*  PLT 235   < > 189 187 245 245 276   < > = values in this interval not displayed.   Cardiac Enzymes: Recent Labs  Lab 12/03/18 1223  TROPONINI 0.18*   CBG: Recent Labs  Lab 12/08/18 1230 12/08/18 1521 12/08/18 2013 12/09/18 0802 12/09/18 1130  GLUCAP 91 108* 113* 110* 147*    Iron Studies: No results for input(s): IRON, TIBC, TRANSFERRIN, FERRITIN in the last 72 hours. Studies/Results: Dg Chest Port 1 View  Result Date: 12/09/2018 CLINICAL DATA:  Pulmonary edema EXAM: PORTABLE CHEST 1 VIEW COMPARISON:  12/08/2010 FINDINGS: Cardiac shadow remains enlarged. Aortic calcifications are again seen. Dialysis catheter is again noted. Lungs are well aerated bilaterally with diffuse interstitial changes stable from the prior exam. Some slight improved aeration in the right base is noted. No new  focal abnormality is noted. IMPRESSION: Minimal improvement in the right base when compared with the prior exam. Electronically Signed   By: Inez Catalina M.D.   On: 12/09/2018 07:55   Dg Chest Port 1 View  Result Date: 12/08/2018 CLINICAL DATA:  Respiratory failure EXAM: PORTABLE CHEST 1 VIEW COMPARISON:  12/07/2018 FINDINGS: Cardiac shadow remains enlarged. Dialysis catheter remains in place. Aortic calcifications are noted. Lungs are well aerated bilaterally with diffuse interstitial changes similar to that seen on the prior exam. Stable right basilar opacity is noted. No sizable effusion is seen. No bony abnormality is noted. IMPRESSION: Stable interstitial changes and right basilar opacity when compared with the prior exam. No new focal abnormality is noted. Electronically Signed   By: Inez Catalina M.D.   On: 12/08/2018 07:45   . amLODipine  5 mg Oral Daily  . Chlorhexidine Gluconate Cloth  6 each Topical Q0600  . doxercalciferol  4 mcg Intravenous Q M,W,F-HD  . famotidine  10 mg Oral Daily  . feeding supplement  1 Container Oral Q24H  . feeding supplement (NEPRO CARB STEADY)  237 mL Oral BID BM  . [START ON 12/10/2018] fluticasone  2 spray Each Nare Daily  . heparin  5,000 Units Subcutaneous Q8H  . insulin aspart  0-5 Units Subcutaneous QHS  . insulin aspart  0-9 Units Subcutaneous TID WC  . mouth rinse  15 mL Mouth Rinse BID  . multivitamin  1 tablet Oral QHS  . sevelamer carbonate  1,600 mg Oral TID WC  . vancomycin  125 mg Oral QID    BMET    Component Value Date/Time   NA 134 (L) 12/09/2018 0500   NA 135 03/25/2018 1014   K 3.9 12/09/2018 0500   CL 99 12/09/2018 0500   CO2 21 (L) 12/09/2018 0500   GLUCOSE 93 12/09/2018 0500   BUN 24 (H) 12/09/2018 0500   BUN 96 (HH) 03/25/2018  1014   CREATININE 6.78 (H) 12/09/2018 0500   CALCIUM 8.9 12/09/2018 0500   CALCIUM 8.3 (L) 09/23/2018 1329   GFRNONAA 6 (L) 12/09/2018 0500   GFRAA 7 (L) 12/09/2018 0500   CBC    Component Value  Date/Time   WBC 8.4 12/09/2018 0500   RBC 3.70 (L) 12/09/2018 0500   HGB 8.9 (L) 12/09/2018 0500   HGB 8.0 (L) 03/27/2018 1107   HCT 29.3 (L) 12/09/2018 0500   HCT 25.3 (L) 03/27/2018 1107   PLT 276 12/09/2018 0500   PLT 126 (L) 03/27/2018 1107   MCV 79.2 (L) 12/09/2018 0500   MCV 83 03/27/2018 1107   MCH 24.1 (L) 12/09/2018 0500   MCHC 30.4 12/09/2018 0500   RDW 20.3 (H) 12/09/2018 0500   RDW 19.9 (H) 03/27/2018 1107   LYMPHSABS 2.3 12/03/2018 1208   LYMPHSABS 0.5 (L) 03/27/2018 1107   MONOABS 0.9 12/03/2018 1208   EOSABS 0.0 12/03/2018 1208   EOSABS 0.0 03/27/2018 1107   BASOSABS 0.1 12/03/2018 1208   BASOSABS 0.0 03/27/2018 1107

## 2018-12-09 NOTE — Progress Notes (Signed)
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 She had a partial HD tx on 3/21, and then missed her subsequent txs.   She had been admitted 3/8 > 3/13 with B lung infiltrates c/w pulmonary edema vs PNA in the setting of missed dialysis. She was cocaine positive. She was again admitted 3/17 - 3/18 with AMS thought related to cocaine overdose.   Patient reports having partial iHD on 3/21.  Denies sick contacts, fever, known COVID -19 exposure, travel, or recent drug use.  She was found by EMS hypoxic with sats in the 80's.  In ER, she remained hypertensive, hypoxic, rectal temp 100.5. CXR showed bilateral infiltrates. Flu neg.  Labs noted for K 5.4, glucose 120, BUN 83, sCr 13.17, WBC 14.1, Hgb 10.7, Lactic 2.4, trop I 0.18 and BNP >4500, EKG non acute. She was treated with empiric abx coverage with vancomycin, cefepime, and flagyl, not continued  Significant Events: 3/8 > 3/13 admit 3/17 > 3/18 admit 3/25 this admit - intubated 3/28 extubated 3/29 C diff positive   Subjective: Pt was verbally abusive with staff last night. She refuses to wear her tele monitor. She c/o diffuse crampy abdom pain, and ongoing watery diarrhea. She denies sob, n/v, or CP.    Assessment & Plan:  Acute hypoxic respiratory failure - B infiltrates / acute pulmonary edema - Cocaine injury (SARS CoV-2 NEGATIVE) Resolved w/ pt now weaned to RA  C.Diff colitis  Cont oral Vanc - stop PPI  HTN initial hypertensive urgency in setting of cocaine abuse, non-compliance, and missed HD - due for HD today - follow w/o change in medical tx at this time   ESRD -  T/Th/Sat HD  Care per Nephrology   DM2  CBG well controlled presently   Substance abuse Has been counseled on absolute need to abstain  Severe medical non-compliance  Has been counseled on absolute need to comply w/ meds and HD tx   N/V w/ Small amount hematemesis at time of presentation CT abdomen without any abnormality - stop PPI and begin H2 blocker   Chronic Normocytic Anemia of ESRD epo and Fe per Nephrology   Trichomonas on UA s/p flagyl course   DVT prophylaxis: SQ heparin  Code Status: FULL CODE Family Communication: no family present at time of exam  Disposition Plan: transferred to med/surg as she refuses to wear monitor - for HD today - possible d/c home 4/1 if oral vanc can be procured and if sx better controlled   Consultants:  PCCM Nephrology   Antimicrobials:  3/25 vanc   3/25 flagyl  3/25 cefepime 3//26 Zosyn > 3/30 3/29 oral Vanc >  Objective: Blood pressure (!) 176/111, pulse 82, temperature (!) 97.4 F (36.3 C), temperature source Oral, resp. rate 20, height 5\' 2"  (1.575 m), weight 54.1 kg, SpO2 96 %.  Intake/Output Summary (Last 24 hours) at 12/09/2018 1038 Last data filed at 12/08/2018 2155 Gross per 24 hour  Intake 100 ml  Output -  Net 100 ml   Filed Weights   12/07/18 1119 12/07/18 1505 12/08/18 0320  Weight: 57.5 kg 54.4 kg 54.1 kg  Examination: General: No acute respiratory distress Lungs: Clear to auscultation bilaterally without wheezes or crackles Cardiovascular: Regular rate and rhythm without murmur gallop or rub normal S1 and S2 Abdomen: NT/ND, soft, bowel sounds positive, no rebound Extremities: No significant cyanosis, clubbing, or edema bilateral lower extremities  CBC: Recent Labs  Lab 12/03/18 1208  12/07/18 0457 12/08/18 0339 12/09/18 0500  WBC 14.1*   < > 9.3 8.2 8.4  NEUTROABS 10.8*  --   --   --   --   HGB 10.7*   < > 9.0* 8.8* 8.9*  HCT 36.2   < > 28.9* 28.6* 29.3*  MCV 81.3   < > 79.8* 80.1 79.2*   PLT 235   < > 245 245 276   < > = values in this interval not displayed.   Basic Metabolic Panel: Recent Labs  Lab 12/06/18 0244 12/07/18 0457 12/08/18 0339 12/09/18 0500  NA 135 136 134* 134*  K 3.3* 3.6 3.5 3.9  CL 100 101 98 99  CO2 25 24 23  21*  GLUCOSE 92 89 94 93  BUN 27* 42* 13 24*  CREATININE 5.19* 7.80* 4.30* 6.78*  CALCIUM 8.1* 8.5* 8.5* 8.9  MG 1.9 2.1 1.9  --   PHOS 4.2  --   --   --    GFR: Estimated Creatinine Clearance: 6.5 mL/min (A) (by C-G formula based on SCr of 6.78 mg/dL (H)).  Liver Function Tests: Recent Labs  Lab 12/03/18 1208 12/04/18 0310 12/05/18 0422  AST 43* 32 24  ALT 21 16 15   ALKPHOS 63 53 60  BILITOT 0.9 0.8 0.8  PROT 7.4 5.6* 6.5  ALBUMIN 2.6* 2.0* 2.0*   Recent Labs  Lab 12/03/18 1208 12/05/18 0422  LIPASE 33 29    Cardiac Enzymes: Recent Labs  Lab 12/03/18 1223  TROPONINI 0.18*    HbA1C: Hgb A1c MFr Bld  Date/Time Value Ref Range Status  07/19/2018 05:34 AM 6.1 (H) 4.8 - 5.6 % Final    Comment:    (NOTE) Pre diabetes:          5.7%-6.4% Diabetes:              >6.4% Glycemic control for   <7.0% adults with diabetes   04/02/2018 02:45 PM 6.8 (H) 4.8 - 5.6 % Final    Comment:    (NOTE) Pre diabetes:          5.7%-6.4% Diabetes:              >6.4% Glycemic control for   <7.0% adults with diabetes     CBG: Recent Labs  Lab 12/08/18 0909 12/08/18 1230 12/08/18 1521 12/08/18 2013 12/09/18 0802  GLUCAP 82 91 108* 113* 110*    Recent Results (from the past 240 hour(s))  Blood Culture (routine x 2)     Status: None   Collection Time: 12/03/18 12:08 PM  Result Value Ref Range Status   Specimen Description BLOOD RIGHT HAND  Final   Special Requests   Final    BOTTLES DRAWN AEROBIC AND ANAEROBIC Blood Culture results may not be optimal due to an inadequate volume of blood received in culture bottles   Culture   Final    NO GROWTH 5 DAYS Performed at [facility], [place] [address] [place],  [place] [address]    Report Status 12/08/2018 FINAL  Final  Blood Culture (routine x 2)     Status: None   Collection Time: 12/03/18 12:13 PM  Result  Value Ref Range Status   Specimen Description BLOOD RIGHT WRIST  Final   Special Requests   Final    BOTTLES DRAWN AEROBIC AND ANAEROBIC Blood Culture results may not be optimal due to an inadequate volume of blood received in culture bottles   Culture   Final    NO GROWTH 5 DAYS Performed at [facility], [place] [address], [place], [place] [address]    Report Status 12/08/2018 FINAL  Final  Respiratory Panel by PCR     Status: None   Collection Time: 12/03/18  1:13 PM  Result Value Ref Range Status   Adenovirus NOT DETECTED NOT DETECTED Final   Coronavirus 229E NOT DETECTED NOT DETECTED Final    Comment: (NOTE) The Coronavirus on the Respiratory Panel, DOES NOT test for the novel  Coronavirus (2019 nCoV)    Coronavirus HKU1 NOT DETECTED NOT DETECTED Final   Coronavirus NL63 NOT DETECTED NOT DETECTED Final   Coronavirus OC43 NOT DETECTED NOT DETECTED Final   Metapneumovirus NOT DETECTED NOT DETECTED Final   Rhinovirus / Enterovirus NOT DETECTED NOT DETECTED Final   Influenza A NOT DETECTED NOT DETECTED Final   Influenza B NOT DETECTED NOT DETECTED Final   Parainfluenza Virus 1 NOT DETECTED NOT DETECTED Final   Parainfluenza Virus 2 NOT DETECTED NOT DETECTED Final   Parainfluenza Virus 3 NOT DETECTED NOT DETECTED Final   Parainfluenza Virus 4 NOT DETECTED NOT DETECTED Final   Respiratory Syncytial Virus NOT DETECTED NOT DETECTED Final   Bordetella pertussis NOT DETECTED NOT DETECTED Final   Chlamydophila pneumoniae NOT DETECTED NOT DETECTED Final   Mycoplasma pneumoniae NOT DETECTED NOT DETECTED Final    Comment: Performed at [facility], [address], [place], [place] [address]  Novel Coronavirus, NAA (hospital order; send-out to ref lab)     Status: None   Collection Time: 12/03/18 11:13 PM  Result Value Ref Range Status    SARS-CoV-2, NAA NOT DETECTED NOT DETECTED Final    Comment: (NOTE) Testing was performed using the cobas(R) SARS-CoV-2 test. This test was developed and its performance characteristics determined by [employer]. This test has not been FDA cleared or approved. This test has been authorized by FDA under an Emergency Use Authorization (EUA). This test is only authorized for the duration of time the declaration that circumstances exist justifying the authorization of the emergency use of in vitro diagnostic tests for detection of SARS-CoV-2 virus and/or diagnosis of COVID-19 infection under section 564(b)(1) of the Act, 21 U.S.C. 833ASN-0(N)(3), unless the authorization is terminated or revoked sooner. Performed At: [facility] [address] [place], [place] [id number] Rush Farmer MD XT:[id number]    [place]  Final    Comment: Performed at [facility], [place] [address] [place], [place] [address]  Novel Coronavirus, NAA (hospital order; send-out to ref lab)     Status: None   Collection Time: 12/05/18 12:53 PM  Result Value Ref Range Status   SARS-CoV-2, NAA NOT DETECTED NOT DETECTED Final    Comment: (NOTE) Testing was performed using the cobas(R) SARS-CoV-2 test. This test was developed and its performance characteristics determined by [employer]. This test has not been FDA cleared or approved. This test has been authorized by FDA under an Emergency Use Authorization (EUA). This test is only authorized for the duration of time the declaration that circumstances exist justifying the authorization of the emergency use of in vitro diagnostic tests for detection of SARS-CoV-2 virus and/or  diagnosis of COVID-19 infection under section 564(b)(1) of the Act, 21 U.S.C. 914NWG-9(F)(6), unless the authorization is terminated or revoked sooner. Performed At: [facility] [address] [place], [place] [id number] Rush Farmer MD IO:[id number]    Coronavirus Source NASAL SWAB  Corrected    Comment: Performed at [facility], [place] [address], [place], [place] [address] CORRECTED ON 03/27 AT 1625: PREVIOUSLY REPORTED AS NASOPHARYNGEAL   C difficile quick scan w PCR reflex     Status: Abnormal   Collection Time: 12/07/18 12:49 PM  Result Value Ref Range Status   C Diff antigen POSITIVE (A) NEGATIVE Final   C Diff toxin NEGATIVE NEGATIVE Final   C Diff interpretation Results are indeterminate. See PCR results.  Final    Comment: Performed at [facility], [place] [address], [place], [place] [address]  C. Diff by PCR, Reflexed     Status: Abnormal   Collection Time: 12/07/18 12:49 PM  Result Value Ref Range Status   Toxigenic C. Difficile by PCR POSITIVE (A) NEGATIVE Final    Comment: Positive for toxigenic C. difficile with little to no toxin production. Only treat if clinical presentation suggests symptomatic illness. Performed at [facility], [place] [address], [place], [place] [address]      Scheduled Meds: . amLODipine  5 mg Oral Daily  . Chlorhexidine Gluconate Cloth  6 each Topical Q0600  . doxercalciferol  4 mcg Intravenous Q M,W,F-HD  . feeding supplement  1 Container Oral Q24H  . feeding supplement (NEPRO CARB STEADY)  237 mL Oral BID BM  . heparin  5,000 Units Subcutaneous Q8H  . insulin aspart  0-5 Units Subcutaneous QHS  . insulin aspart  0-9 Units Subcutaneous TID WC  . mouth rinse  15 mL Mouth Rinse BID  . pantoprazole  40 mg Oral Daily  . vancomycin  125 mg Oral QID   Continuous Infusions: . piperacillin-tazobactam (ZOSYN)  IV 3.375 g (12/09/18 0547)     LOS: 6 days   Cherene Altes, MD [facility] Office  [phone] Pager - Text Page per Amion  If 7PM-7AM, please contact night-coverage per Amion 12/09/2018, 10:38 AM

## 2018-12-09 NOTE — Progress Notes (Signed)
Pt climbed over the siderails, disconnected self from heart monitor, and came to door looking groggy asking for pain meds. Dr.Summers paged thru service secretary

## 2018-12-10 DIAGNOSIS — J9621 Acute and chronic respiratory failure with hypoxia: Secondary | ICD-10-CM

## 2018-12-10 LAB — GLUCOSE, CAPILLARY
Glucose-Capillary: 125 mg/dL — ABNORMAL HIGH (ref 70–99)
Glucose-Capillary: 103 mg/dL — ABNORMAL HIGH (ref 70–99)
Glucose-Capillary: 123 mg/dL — ABNORMAL HIGH (ref 70–99)
Glucose-Capillary: 108 mg/dL — ABNORMAL HIGH (ref 70–99)

## 2018-12-10 MED ORDER — PRO-STAT SUGAR FREE PO LIQD
30.0000 mL | Freq: Two times a day (BID) | ORAL | Status: DC
  Administered 2018-12-11: 30 mL via ORAL
  Filled 2018-12-10 (×3): qty 30

## 2018-12-10 MED ORDER — CHLORHEXIDINE GLUCONATE CLOTH 2 % EX PADS
6.0000 | MEDICATED_PAD | Freq: Every day | CUTANEOUS | Status: DC
  Administered 2018-12-12: 6 via TOPICAL

## 2018-12-10 MED ORDER — DOXERCALCIFEROL 4 MCG/2ML IV SOLN
3.0000 ug | INTRAVENOUS | Status: DC
  Filled 2018-12-10 (×2): qty 2

## 2018-12-10 NOTE — H&P (View-Only) (Signed)
Hospital Consult    Reason for Consult:  Clotted L arm AVG Requesting Physician:  Dr. Jonnie Finner MRN #:  [id number]  History of Present Illness: This is a 63 y.o. female with ESRD on HD.  She was admitted for SOB and was negative for COVID 19.  She was "pulling clots" during HD treatment yesterday.  Today L arm AVG noted to be thrombosed.  This was recently placed by Dr. Carlis Abbott 10/27/18.  She also had L UE venogram at that time to evaluate central stenosis due to prior multiple fistula failures.  Central venogram was inconclusive however it was felt to be worth trial with L AVG prior to switching to R arm.  She is willing to consent to thrombectomy of L arm AVG prior to dialysis access placement in R arm.  She is currently dialyzing via R IJ TDC and next dialysis treatment scheduled for 12/11/18.  She had breakfast this morning.  She is not taking any blood thinners.  Past Medical History:  Diagnosis Date  . Diabetes mellitus without complication ([place])   . ESRD (end stage renal disease) on dialysis ([place])    "TTS; [facility]; they're moving me to another one" (09/22/2018)  . Hepatitis C    diagnosed 09/2018  . Hypertension   . Oxygen deficiency    2 L at night  . Stroke ([place])   . Substance abuse ([place])    has been clean for 4 months     Past Surgical History:  Procedure Laterality Date  . AV FISTULA PLACEMENT Left 03/05/2018   Procedure: ARTERIOVENOUS (AV) FISTULA CREATION  LEFT UPPER EXTREMITY;  Surgeon: [name], MD;  Location: [facility];  Service: Vascular;  Laterality: Left;  . AV FISTULA PLACEMENT Left 07/21/2018   Procedure: ARTERIOVENOUS (AV) FISTULA CREATION;  Surgeon: Marty Heck, MD;  Location: [facility];  Service: Vascular;  Laterality: Left;  . AV FISTULA PLACEMENT Left 10/27/2018   Procedure: INSERTION OF ARTERIOVENOUS (AV) GORE-TEX GRAFT UPPER ARM;  Surgeon: Marty Heck, MD;  Location: [facility];  Service: Vascular;  Laterality: Left;  . BASCILIC VEIN TRANSPOSITION  Left 09/29/2018   Procedure: BASILIC VEIN TRANSPOSITION SECOND STAGE LEFT UPPER ARM;  Surgeon: Marty Heck, MD;  Location: [facility];  Service: Vascular;  Laterality: Left;  . HEMATOMA EVACUATION Left 03/06/2018   Procedure: EVACUATION HEMATOMA LEFT ARM;  Surgeon: Angelia Mould, MD;  Location: [facility];  Service: Vascular;  Laterality: Left;  . I&D EXTREMITY Left 10/06/2018   Procedure: IRRIGATION AND DEBRIDEMENT ARM;  Surgeon: Marty Heck, MD;  Location: [facility];  Service: Vascular;  Laterality: Left;  . INSERTION OF DIALYSIS CATHETER Left 04/29/2018   Procedure: INSERTION OF DIALYSIS CATHETER LEFT INTERNAL JUGULAR;  Surgeon: Angelia Mould, MD;  Location: [facility];  Service: Vascular;  Laterality: Left;  . IR FLUORO GUIDE CV LINE RIGHT  02/18/2018  . IR FLUORO GUIDE CV LINE RIGHT  02/26/2018  . IR REMOVAL TUN CV CATH W/O FL  04/27/2018  . IR US GUIDE VASC ACCESS RIGHT  02/18/2018  . TEE WITHOUT CARDIOVERSION N/A 04/29/2018   Procedure: TRANSESOPHAGEAL ECHOCARDIOGRAM (TEE);  Surgeon: Sueanne Margarita, MD;  Location: [facility] ENDOSCOPY;  Service: Cardiovascular;  Laterality: N/A;  . VENOGRAM Left 10/27/2018   Procedure: LEFT CENTRAL VENOGRAM;  Surgeon: Marty Heck, MD;  Location: [facility];  Service: Vascular;  Laterality: Left;    Allergies  Allergen Reactions  . Acetaminophen Nausea And Vomiting    Patient states  Acetaminophen and Acetaminophen containing products make her nauseated. She demonstrated this 06/21/18 with nausea followed by emesis.  Marland Kitchen Hydroxyzine Itching    Prior to Admission medications   Medication Sig Start Date End Date Taking? Authorizing Provider  acetaminophen (TYLENOL) 500 MG tablet Take 1,000 mg by mouth every 6 (six) hours as needed for mild pain.   Yes [provider]  amLODipine (NORVASC) 5 MG tablet Take 1 tablet (5 mg total) by mouth at bedtime. 10/13/18  Yes Alphonzo Grieve, MD  B Complex-C-Zn-Folic Acid (DIALYVITE 322-GURK 15) 0.8 MG TABS  Take 1 tablet by mouth daily. 11/04/18  Yes [provider]  Blood Glucose Monitoring Suppl (ACCU-CHEK AVIVA) device Use as instructed 10/29/18 10/29/19 Yes Isabelle Course, MD  calcitRIOL (ROCALTROL) 0.25 MCG capsule Take 1 capsule (0.25 mcg total) by mouth every Monday, Wednesday, and Friday with hemodialysis. 10/29/18  Yes Isabelle Course, MD  diphenhydrAMINE (BENADRYL) 2 % cream Apply 1 application topically 3 (three) times daily as needed for itching.   Yes [provider]  fluticasone (FLONASE) 50 MCG/ACT nasal spray Place 2 sprays into both nostrils daily.   Yes [provider]  glucose blood (ACCU-CHEK AVIVA) test strip Use twice per day. 10/29/18  Yes Isabelle Course, MD  hydrOXYzine (ATARAX/VISTARIL) 25 MG tablet Take 25 mg by mouth 3 (three) times daily as needed for itching.   Yes [provider]  insulin aspart (NOVOLOG) 100 UNIT/ML injection Inject 5 Units into the skin 3 (three) times daily with meals. Patient taking differently: Inject 0-9 Units into the skin 3 (three) times daily with meals.  10/13/18  Yes Alphonzo Grieve, MD  insulin glargine (LANTUS) 100 UNIT/ML injection Inject 0.03 mLs (3 Units total) into the skin at bedtime. Patient taking differently: Inject 5 Units into the skin at bedtime.  10/29/18  Yes Isabelle Course, MD  Insulin Syringe-Needle U-100 (INSULIN SYRINGE .3CC/31GX5/16") 31G X 5/16" 0.3 ML MISC Inject 1 each into the skin 4 (four) times daily. 06/27/18  Yes Clent Demark, PA-C  Lancets [facility]) lancets Use twice per day. 10/29/18  Yes Isabelle Course, MD  pantoprazole (PROTONIX) 40 MG tablet Take 1 tablet (40 mg total) by mouth daily. 11/21/18  Yes Ghimire, Henreitta Leber, MD  sevelamer carbonate (RENVELA) 800 MG tablet Take 2 tablets (1,600 mg total) by mouth 3 (three) times daily with meals. 11/26/18  Yes Bonnell Public, MD    Social History   Socioeconomic History  . Marital status: Single    Spouse name: Not on  file  . Number of children: Not on file  . Years of education: Not on file  . Highest education level: Not on file  Occupational History  . Not on file  Social Needs  . Financial resource strain: Not on file  . Food insecurity:    Worry: Not on file    Inability: Not on file  . Transportation needs:    Medical: Not on file    Non-medical: Not on file  Tobacco Use  . Smoking status: Current Every Day Smoker    Packs/day: 0.50    Years: 48.00    Pack years: 24.00    Types: Cigarettes  . Smokeless tobacco: Never Used  . Tobacco comment: trying to quit ; smoking 1 1/2 cigarettes/day  (09/22/2018)  Substance and Sexual Activity  . Alcohol use: Not Currently    Comment: 09/22/2018 "stopped about 6 months ago"  . Drug use: Yes  Types: Cocaine, Heroin    Comment: See notes  . Sexual activity: Not Currently    Birth control/protection: None  Lifestyle  . Physical activity:    Days per week: Not on file    Minutes per session: Not on file  . Stress: Not on file  Relationships  . Social connections:    Talks on phone: Not on file    Gets together: Not on file    Attends religious service: Not on file    Active member of club or organization: Not on file    Attends meetings of clubs or organizations: Not on file    Relationship status: Not on file  . Intimate partner violence:    Fear of current or ex partner: Not on file    Emotionally abused: Not on file    Physically abused: Not on file    Forced sexual activity: Not on file  Other Topics Concern  . Not on file  Social History Narrative   Per notes, Pt lives with her son.     Family History  Problem Relation Age of Onset  . Autoimmune disease Neg Hx     ROS: Otherwise negative unless mentioned in HPI  Physical Examination  Vitals:   12/10/18 0520 12/10/18 0730  BP: (!) 125/59 129/65  Pulse: (!) 101 (!) 102  Resp: 18 18  Temp: 98.6 F (37 C) 98.5 F (36.9 C)  SpO2: 92% 95%   Body mass index is 22.58  kg/m.  General:  WDWN in NAD Gait: Not observed HENT: WNL, normocephalic Pulmonary: normal non-labored breathing Cardiac: regular Abdomen:  soft, NT/ND, no masses Skin: without rashes Vascular Exam/Pulses: palpable L radial pulse Extremities: no palpable thrill or audible bruit through L arm AVG  Neurologic: A&O X 3;  No focal weakness or paresthesias are detected; speech is fluent/normal Psychiatric:  The pt has Normal affect. Lymph:  Unremarkable  CBC    Component Value Date/Time   WBC 8.4 12/09/2018 0500   RBC 3.70 (L) 12/09/2018 0500   HGB 8.9 (L) 12/09/2018 0500   HGB 8.0 (L) 03/27/2018 1107   HCT 29.3 (L) 12/09/2018 0500   HCT 25.3 (L) 03/27/2018 1107   PLT 276 12/09/2018 0500   PLT 126 (L) 03/27/2018 1107   MCV 79.2 (L) 12/09/2018 0500   MCV 83 03/27/2018 1107   MCH 24.1 (L) 12/09/2018 0500   MCHC 30.4 12/09/2018 0500   RDW 20.3 (H) 12/09/2018 0500   RDW 19.9 (H) 03/27/2018 1107   LYMPHSABS 2.3 12/03/2018 1208   LYMPHSABS 0.5 (L) 03/27/2018 1107   MONOABS 0.9 12/03/2018 1208   EOSABS 0.0 12/03/2018 1208   EOSABS 0.0 03/27/2018 1107   BASOSABS 0.1 12/03/2018 1208   BASOSABS 0.0 03/27/2018 1107    BMET    Component Value Date/Time   NA 134 (L) 12/09/2018 0500   NA 135 03/25/2018 1014   K 3.9 12/09/2018 0500   CL 99 12/09/2018 0500   CO2 21 (L) 12/09/2018 0500   GLUCOSE 93 12/09/2018 0500   BUN 24 (H) 12/09/2018 0500   BUN 96 (HH) 03/25/2018 1014   CREATININE 6.78 (H) 12/09/2018 0500   CALCIUM 8.9 12/09/2018 0500   CALCIUM 8.3 (L) 09/23/2018 1329   GFRNONAA 6 (L) 12/09/2018 0500   GFRAA 7 (L) 12/09/2018 0500    COAGS: Lab Results  Component Value Date   INR 0.94 10/27/2018   INR 0.96 09/29/2018   INR 0.97 07/23/2018     ASSESSMENT/PLAN: This is  a 63 y.o. female with thrombosed L arm AVG  Thrombosed L arm AVG without thrill or bruit Patient willing to try thrombectomy prior to R arm dialysis access placement Patient had breakfast this  morning Dr. Donzetta Matters will discuss with Dr. Jonnie Finner recommendation for IR to attempt thrombectomy    Dagoberto Ligas PA-C [facility] [phone]   I have independently interviewed and examined patient and agree with PA assessment and plan above.  Patient will benefit from interventional thrombectomy with possible central venous intervention as well.  If this is not possible we can plan right arm access in the future but patient currently has catheter for dialysis.  Lizzie Cokley C. Donzetta Matters, MD [facility] Office: [phone] Pager: [phone]   Addendum: discussed with nephrology and IR and will plan left arm open thrombectomy tomorrow as last ditch effort to use left arm prior to using right.   Fantasy Donald C. Donzetta Matters

## 2018-12-10 NOTE — Progress Notes (Addendum)
[facility] Progress Note   TTS [place] 4h   400800   58.5kg  2/2.5 bath  TDC + L AVG placed 2/17 - hect 4  - mircera 100 every 2 wks, last 3/21    Assessment/Plan: 1. Acute hypoxic respiratory failure/ pulm edema / fevers - Tmax 99.6   COVID-19 negative. CT showed diffuse infiltrates initially, now CXR clearing nicely which argues for volume overload as initial issue.  2. ESRD- TTS HD. 3-4 kg under prior dry wt, losing body wt. Post Wt Tuesday 55.1 - next Thursday - will work around delcot if able to be done Thursday. 3. Anemia of CKD- hgb 8- 9 here. Follow and transfuse prn. Aranesp 100 given 3/31  4. Vascular access- LAVG placed 2/17- pulled clots yesterday - no bruit - VVS consulted to evaluate. Talked w/ VVS, they are planning declot attempt tomorrow.  5. Substance abuse- + cocaine 6. 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 No longer on immunosuppressants.  7. Diarrhea -/enteric precautions/ Cdiff + on oral VAnc 8. Clotted left AVGG -appreciate VVS eval . Dr. Donzetta Matters rec attempted declot by IR before further surgery - will consult. 9. Nutrition - alb 2 added prostat- / supplements ordered 10. BMD - P ok continue hectorol Ca borderline high decrease hectorol to 3 - need to change to 2 K 2 Ca bath at d/c 11. Hx dialysis and medical noncompliance - poor coping skills/marginal support  Myriam Jacobson, PA-C [facility] Beeper [phone] 12/10/2018,9:16 AM  LOS: 7 days   Pt seen, examined and agree w A/P as above.  [facility] 12/10/2018, 3:24 PM    Subjective:   Multiple service complaints.   Objective Vitals:   12/09/18 2049 12/09/18 2332 12/10/18 0520 12/10/18 0730  BP: (!) 152/94  (!) 125/59 129/65  Pulse: 99 (!) 105 (!) 101 (!) 102  Resp: 18  18 18   Temp: 99.6 F (37.6 C)  98.6 F (37 C) 98.5 F (36.9 C)  TempSrc: Oral  Oral Oral  SpO2: 95% 95% 92% 95%  Weight:   56 kg   Height:        Physical Exam General: irritable NAD breathing easily on room air Heart: tachy reg  Lungs: dim BS no overt rales Abdomen: soft NT Extremities: no edema Dialysis Access: left AVGG no bruit right IJ [facility]   Additional Objective Labs: Basic Metabolic Panel: Recent Labs  Lab 12/06/18 0244 12/07/18 0457 12/08/18 0339 12/09/18 0500  NA 135 136 134* 134*  K 3.3* 3.6 3.5 3.9  CL 100 101 98 99  CO2 25 24 23  21*  GLUCOSE 92 89 94 93  BUN 27* 42* 13 24*  CREATININE 5.19* 7.80* 4.30* 6.78*  CALCIUM 8.1* 8.5* 8.5* 8.9  PHOS 4.2  --   --   --    Liver Function Tests: Recent Labs  Lab 12/03/18 1208 12/04/18 0310 12/05/18 0422  AST 43* 32 24  ALT 21 16 15   ALKPHOS 63 53 60  BILITOT 0.9 0.8 0.8  PROT 7.4 5.6* 6.5  ALBUMIN 2.6* 2.0* 2.0*   Recent Labs  Lab 12/03/18 1208 12/05/18 0422  LIPASE 33 29   CBC: Recent Labs  Lab 12/03/18 1208  12/05/18 0422 12/06/18 0244 12/07/18 0457 12/08/18 0339 12/09/18 0500  WBC 14.1*   < > 7.7 8.3 9.3 8.2 8.4  NEUTROABS 10.8*  --   --   --   --   --   --  HGB 10.7*   < > 8.2* 8.1* 9.0* 8.8* 8.9*  HCT 36.2   < > 27.3* 25.3* 28.9* 28.6* 29.3*  MCV 81.3   < > 80.3 78.8* 79.8* 80.1 79.2*  PLT 235   < > 189 187 245 245 276   < > = values in this interval not displayed.   Blood Culture    Component Value Date/Time   SDES BLOOD RIGHT WRIST 12/03/2018 1213   SPECREQUEST  12/03/2018 1213    BOTTLES DRAWN AEROBIC AND ANAEROBIC Blood Culture results may not be optimal due to an inadequate volume of blood received in culture bottles   CULT  12/03/2018 1213    NO GROWTH 5 DAYS Performed at [facility], [place] [address], [place], [place] [address]    REPTSTATUS 12/08/2018 FINAL 12/03/2018 1213    Cardiac Enzymes: Recent Labs  Lab 12/03/18 1223  TROPONINI 0.18*   CBG: Recent Labs  Lab 12/09/18 0802 12/09/18 1130 12/09/18 1741 12/09/18 2046 12/10/18 0723  GLUCAP 110* 147* 80 101* 125*   Iron Studies: No results for  input(s): IRON, TIBC, TRANSFERRIN, FERRITIN in the last 72 hours. Lab Results  Component Value Date   INR 0.94 10/27/2018   INR 0.96 09/29/2018   INR 0.97 07/23/2018   Studies/Results: Dg Chest Port 1 View  Result Date: 12/09/2018 CLINICAL DATA:  Pulmonary edema EXAM: PORTABLE CHEST 1 VIEW COMPARISON:  12/08/2010 FINDINGS: Cardiac shadow remains enlarged. Aortic calcifications are again seen. Dialysis catheter is again noted. Lungs are well aerated bilaterally with diffuse interstitial changes stable from the prior exam. Some slight improved aeration in the right base is noted. No new focal abnormality is noted. IMPRESSION: Minimal improvement in the right base when compared with the prior exam. Electronically Signed   By: Inez Catalina M.D.   On: 12/09/2018 07:55   Medications:  . amLODipine  5 mg Oral Daily  . Chlorhexidine Gluconate Cloth  6 each Topical Q0600  . darbepoetin (ARANESP) injection - DIALYSIS  100 mcg Intravenous Q Tue-HD  . doxercalciferol  4 mcg Intravenous Q M,W,F-HD  . famotidine  10 mg Oral Daily  . feeding supplement  1 Container Oral Q24H  . feeding supplement (NEPRO CARB STEADY)  237 mL Oral BID BM  . fluticasone  2 spray Each Nare Daily  . heparin  5,000 Units Subcutaneous Q8H  . insulin aspart  0-5 Units Subcutaneous QHS  . insulin aspart  0-9 Units Subcutaneous TID WC  . mouth rinse  15 mL Mouth Rinse BID  . multivitamin  1 tablet Oral QHS  . sevelamer carbonate  1,600 mg Oral TID WC  . vancomycin  125 mg Oral QID

## 2018-12-10 NOTE — Progress Notes (Signed)
PT Cancellation/Discharge Note  Patient Details Name: DEBHORA TITUS MRN: [id number] DOB: 1956-07-06   Cancelled Treatment:    Reason Eval/Treat Not Completed: Other (comment).  Per pt and RN there are not PT needs at this time.  Pt is ambulating independently around the room and into the hallway.  PT to sign off.  Pt is hopeful to go home soon.  Thanks,  Barbarann Ehlers. Marrio Scribner, PT, DPT  Acute Rehabilitation [phone] pager [phone]) [phone] office     Wells Guiles B Shiori Adcox 12/10/2018, 6:30 PM

## 2018-12-10 NOTE — Progress Notes (Signed)
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 She had a partial HD tx on 3/21, and then missed her subsequent txs.   She had been admitted 3/8 > 3/13 with B lung infiltrates c/w pulmonary edema vs PNA in the setting of missed dialysis. She was cocaine positive. She was again admitted 3/17 - 3/18 with AMS thought related to cocaine overdose.   Patient reports having partial iHD on 3/21. Denies sick contacts, fever, known COVID -19 exposure, travel, or recent drug use. She was found by EMS hypoxic with sats in the 80's. In ER, she remained hypertensive, hypoxic, rectal temp 100.5. CXR showed bilateral infiltrates. Flu neg. Labs noted for K 5.4, glucose 120, BUN 83, sCr 13.17, WBC 14.1, Hgb 10.7, Lactic 2.4, trop I 0.18 and BNP >4500, EKG non acute. She was treated with empiric abx coverage with vancomycin, cefepime, and flagyl, not continued   Subjective: Does not interact much this morning, denies any specific complaints.  Night RN reports only one bowel movement overnight  Assessment & Plan: Active Problems:   Acute respiratory failure with hypoxia (HCC)   Principal Problem  Acute hypoxic respiratory failure - B infiltrates / acute pulmonary edema  - Cocaine injury (SARS CoV-2 NEGATIVE), resolved, she is on room air this morning -Discussed with nephrology, she will be dialyzed again tomorrow and potentially home discharge afterwards  Active problems C.Diff colitis  -Improving, continue oral vancomycin   HTN -initial hypertensive urgency insetting of cocaine abuse, non-compliance, and missed HD -Pressure  overall stable  ESRD - T/Th/Sat HD  -Care per Nephrology , dialyzed tomorrow  DM2  -Continue sliding scale, CBGs well-controlled   Substance abuse -Has been counseled on absolute need to abstain  Severe medical non-compliance  -Has been counseled on absolute need to comply w/ meds and HD tx   N/V w/ Small amount hematemesis at time of presentation -CT abdomen without any abnormality - stop PPI and begin H2 blocker   Chronic Normocytic Anemia of ESRD -epo and Fe per Nephrology   Trichomonas on UA -s/p flagyl course   Scheduled Meds: . amLODipine  5 mg Oral Daily  . Chlorhexidine Gluconate Cloth  6 each Topical Q0600  . darbepoetin (ARANESP) injection - DIALYSIS  100 mcg Intravenous Q Tue-HD  . doxercalciferol  4 mcg Intravenous Q M,W,F-HD  . famotidine  10 mg Oral Daily  . feeding supplement  1 Container Oral Q24H  . feeding supplement (NEPRO CARB STEADY)  237 mL Oral BID BM  . fluticasone  2 spray Each Nare Daily  . heparin  5,000 Units Subcutaneous Q8H  . insulin aspart  0-5 Units Subcutaneous QHS  . insulin aspart  0-9 Units Subcutaneous TID WC  . mouth rinse  15 mL Mouth Rinse BID  . multivitamin  1 tablet Oral QHS  . sevelamer carbonate  1,600 mg Oral TID WC  . vancomycin  125 mg Oral QID   Continuous Infusions: PRN Meds:.camphor-menthol, diphenhydrAMINE, fentaNYL (SUBLIMAZE) injection, hydrALAZINE, hyoscyamine, LORazepam, traMADol  DVT prophylaxis: Heparin Code Status: Full code Family Communication: No family present at bedside Disposition Plan: Home tomorrow after dialysis  Consultants:  Nephrology  Procedures:   None   Antimicrobials:  Po vancomycin    Objective: Vitals:   12/09/18 1742 12/09/18 2049 12/09/18 2332 12/10/18 0520  BP: (!) 151/93 (!) 152/94  (!) 125/59  Pulse: 85 99 (!) 105 (!) 101  Resp: 18 18  18   Temp: 98.7 F (37.1 C) 99.6 F (37.6 C)  98.6 F (37 C)  TempSrc: Oral Oral  Oral  SpO2: 98% 95% 95% 92%  Weight:  55.1 kg   56 kg  Height:        Intake/Output Summary (Last 24 hours) at 12/10/2018 0900 Last data filed at 12/10/2018 0528 Gross per 24 hour  Intake 240 ml  Output 0 ml  Net 240 ml   Filed Weights   12/09/18 1315 12/09/18 1742 12/10/18 0520  Weight: 58.1 kg 55.1 kg 56 kg    Examination:  Constitutional: NAD Eyes: No scleral icterus ENMT: Mucous membranes are moist.  Respiratory: clear to auscultation bilaterally, no wheezing, no crackles. Cardiovascular: Regular rate and rhythm, no murmurs / rubs / gallops. Abdomen: no tenderness. Bowel sounds positive.  Skin: no rashes Neurologic: CN 2-12 grossly intact. Strength 5/5 in all 4.  Psychiatric: Normal judgment and insight. Alert and oriented x 3. Normal mood.    Data Reviewed: I have independently reviewed following labs and imaging studies   CBC: Recent Labs  Lab 12/03/18 1208  12/05/18 0422 12/06/18 0244 12/07/18 0457 12/08/18 0339 12/09/18 0500  WBC 14.1*   < > 7.7 8.3 9.3 8.2 8.4  NEUTROABS 10.8*  --   --   --   --   --   --   HGB 10.7*   < > 8.2* 8.1* 9.0* 8.8* 8.9*  HCT 36.2   < > 27.3* 25.3* 28.9* 28.6* 29.3*  MCV 81.3   < > 80.3 78.8* 79.8* 80.1 79.2*  PLT 235   < > 189 187 245 245 276   < > = values in this interval not displayed.   Basic Metabolic Panel: Recent Labs  Lab 12/05/18 0422 12/06/18 0244 12/07/18 0457 12/08/18 0339 12/09/18 0500  NA 138 135 136 134* 134*  K 4.1 3.3* 3.6 3.5 3.9  CL 101 100 101 98 99  CO2 23 25 24 23  21*  GLUCOSE 128* 92 89 94 93  BUN 60* 27* 42* 13 24*  CREATININE 8.78* 5.19* 7.80* 4.30* 6.78*  CALCIUM 8.1* 8.1* 8.5* 8.5* 8.9  MG 2.1 1.9 2.1 1.9  --   PHOS  --  4.2  --   --   --    GFR: Estimated Creatinine Clearance: 6.5 mL/min (A) (by C-G formula based on SCr of 6.78 mg/dL (H)). Liver Function Tests: Recent Labs  Lab 12/03/18 1208 12/04/18 0310 12/05/18 0422  AST 43* 32 24  ALT 21 16 15   ALKPHOS 63 53 60  BILITOT 0.9 0.8 0.8  PROT 7.4 5.6* 6.5  ALBUMIN  2.6* 2.0* 2.0*   Recent Labs  Lab 12/03/18 1208 12/05/18 0422  LIPASE 33 29   No results for input(s): AMMONIA in the last 168 hours. Coagulation Profile: No results for input(s): INR, PROTIME in the last 168 hours. Cardiac Enzymes: Recent Labs  Lab 12/03/18 1223  TROPONINI 0.18*   BNP (last 3 results) No results for input(s): PROBNP in the last 8760 hours. HbA1C: No results for input(s): HGBA1C in the last 72 hours. CBG: Recent Labs  Lab 12/09/18 0802 12/09/18 1130 12/09/18 1741 12/09/18 2046 12/10/18 0723  GLUCAP 110* 147* 80  101* 125*   Lipid Profile: No results for input(s): CHOL, HDL, LDLCALC, TRIG, CHOLHDL, LDLDIRECT in the last 72 hours. Thyroid Function Tests: No results for input(s): TSH, T4TOTAL, FREET4, T3FREE, THYROIDAB in the last 72 hours. Anemia Panel: No results for input(s): VITAMINB12, FOLATE, FERRITIN, TIBC, IRON, RETICCTPCT in the last 72 hours. Urine analysis:    Component Value Date/Time   COLORURINE YELLOW 12/03/2018 1208   APPEARANCEUR HAZY (A) 12/03/2018 1208   LABSPEC 1.020 12/03/2018 1208   PHURINE 6.0 12/03/2018 1208   GLUCOSEU 100 (A) 12/03/2018 1208   HGBUR LARGE (A) 12/03/2018 1208   HGBUR trace-intact 03/08/2010 1452   BILIRUBINUR NEGATIVE 12/03/2018 [place] 12/03/2018 1208   PROTEINUR >300 (A) 12/03/2018 1208   UROBILINOGEN 0.2 03/08/2010 1452   NITRITE NEGATIVE 12/03/2018 1208   LEUKOCYTESUR TRACE (A) 12/03/2018 1208   Sepsis Labs: Invalid input(s): PROCALCITONIN, LACTICIDVEN  Recent Results (from the past 240 hour(s))  Blood Culture (routine x 2)     Status: None   Collection Time: 12/03/18 12:08 PM  Result Value Ref Range Status   Specimen Description BLOOD RIGHT HAND  Final   Special Requests   Final    BOTTLES DRAWN AEROBIC AND ANAEROBIC Blood Culture results may not be optimal due to an inadequate volume of blood received in culture bottles   Culture   Final    NO GROWTH 5 DAYS Performed at  [facility], [place] [address] [place], [place] [address]    Report Status 12/08/2018 FINAL  Final  Blood Culture (routine x 2)     Status: None   Collection Time: 12/03/18 12:13 PM  Result Value Ref Range Status   Specimen Description BLOOD RIGHT WRIST  Final   Special Requests   Final    BOTTLES DRAWN AEROBIC AND ANAEROBIC Blood Culture results may not be optimal due to an inadequate volume of blood received in culture bottles   Culture   Final    NO GROWTH 5 DAYS Performed at [facility], [place] [address] [place], [place] [address]    Report Status 12/08/2018 FINAL  Final  Respiratory Panel by PCR     Status: None   Collection Time: 12/03/18  1:13 PM  Result Value Ref Range Status   Adenovirus NOT DETECTED NOT DETECTED Final   Coronavirus 229E NOT DETECTED NOT DETECTED Final    Comment: (NOTE) The Coronavirus on the Respiratory Panel, DOES NOT test for the novel  Coronavirus (2019 nCoV)    Coronavirus HKU1 NOT DETECTED NOT DETECTED Final   Coronavirus NL63 NOT DETECTED NOT DETECTED Final   Coronavirus OC43 NOT DETECTED NOT DETECTED Final   Metapneumovirus NOT DETECTED NOT DETECTED Final   Rhinovirus / Enterovirus NOT DETECTED NOT DETECTED Final   Influenza A NOT DETECTED NOT DETECTED Final   Influenza B NOT DETECTED NOT DETECTED Final   Parainfluenza Virus 1 NOT DETECTED NOT DETECTED Final   Parainfluenza Virus 2 NOT DETECTED NOT DETECTED Final   Parainfluenza Virus 3 NOT DETECTED NOT DETECTED Final   Parainfluenza Virus 4 NOT DETECTED NOT DETECTED Final   Respiratory Syncytial Virus NOT DETECTED NOT DETECTED Final   Bordetella pertussis NOT DETECTED NOT DETECTED Final   Chlamydophila pneumoniae NOT DETECTED NOT DETECTED Final   Mycoplasma pneumoniae NOT DETECTED NOT DETECTED Final    Comment: Performed at [facility], [address] [place], [place] [address]  Novel Coronavirus, NAA (hospital order; send-out to ref lab)  Status: None   Collection  Time: 12/03/18 11:13 PM  Result Value Ref Range Status   SARS-CoV-2, NAA NOT DETECTED NOT DETECTED Final    Comment: (NOTE) Testing was performed using the cobas(R) SARS-CoV-2 test. This test was developed and its performance characteristics determined by [employer]. This test has not been FDA cleared or approved. This test has been authorized by FDA under an Emergency Use Authorization (EUA). This test is only authorized for the duration of time the declaration that circumstances exist justifying the authorization of the emergency use of in vitro diagnostic tests for detection of SARS-CoV-2 virus and/or diagnosis of COVID-19 infection under section 564(b)(1) of the Act, 21 U.S.C. 622WLN-9(G)(9), unless the authorization is terminated or revoked sooner. Performed At: [facility] [address] [place], [place] [id number] Rush Farmer MD CX:[id number]    [place]  Final    Comment: Performed at [facility], [place] [address], [place], [place] [address]  Novel Coronavirus, NAA (hospital order; send-out to ref lab)     Status: None   Collection Time: 12/05/18 12:53 PM  Result Value Ref Range Status   SARS-CoV-2, NAA NOT DETECTED NOT DETECTED Final    Comment: (NOTE) Testing was performed using the cobas(R) SARS-CoV-2 test. This test was developed and its performance characteristics determined by [employer]. This test has not been FDA cleared or approved. This test has been authorized by FDA under an Emergency Use Authorization (EUA). This test is only authorized for the duration of time the declaration that circumstances exist justifying the authorization of the emergency use of in vitro diagnostic tests for detection of SARS-CoV-2 virus and/or diagnosis of COVID-19 infection under section 564(b)(1) of the Act, 21 U.S.C. 378HYI-5(O)(2), unless the authorization is terminated or revoked sooner. Performed At: [facility] [address] [place], [place] [id number] Rush Farmer MD VE:[id number]    Coronavirus Source NASAL SWAB  Corrected    Comment: Performed at [facility], [place] [address], [place], [place] [address] CORRECTED ON 03/27 AT 1625: PREVIOUSLY REPORTED AS NASOPHARYNGEAL   C difficile quick scan w PCR reflex     Status: Abnormal   Collection Time: 12/07/18 12:49 PM  Result Value Ref Range Status   C Diff antigen POSITIVE (A) NEGATIVE Final   C Diff toxin NEGATIVE NEGATIVE Final   C Diff interpretation Results are indeterminate. See PCR results.  Final    Comment: Performed at [facility], [place] [address], [place], [place] [address]  C. Diff by PCR, Reflexed     Status: Abnormal   Collection Time: 12/07/18 12:49 PM  Result Value Ref Range Status   Toxigenic C. Difficile by PCR POSITIVE (A) NEGATIVE Final    Comment: Positive for toxigenic C. difficile with little to no toxin production. Only treat if clinical presentation suggests symptomatic illness. Performed at [facility], [place] [address], [place],  [address]       Radiology Studies: Dg Chest Port 1 View  Result Date: 12/09/2018 CLINICAL DATA:  Pulmonary edema EXAM: PORTABLE CHEST 1 VIEW COMPARISON:  12/08/2010 FINDINGS: Cardiac shadow remains enlarged. Aortic calcifications are again seen. Dialysis catheter is again noted. Lungs are well aerated bilaterally with diffuse interstitial changes stable from the prior exam. Some slight improved aeration in the right base is noted. No new focal abnormality is noted. IMPRESSION: Minimal improvement in the right base when compared with the prior exam. Electronically Signed   By: Linus Mako.D.  On: 12/09/2018 07:55    Marzetta Board, MD, PhD [facility]  Contact via  [email or web address]  [place] P: [phone]  F: [phone]

## 2018-12-10 NOTE — TOC Benefit Eligibility Note (Signed)
Transition of Care [facility]) Benefit Eligibility Note    Patient Details  Name: EMILLEE TALSMA MRN: [id number] Date of Birth: [birth year]   Medication/Dose: ORAL VANCOMYCIN  1250 MG    Covered?: Yes  Tier: (TIER- 4 DRUG)  Prescription Coverage Preferred Pharmacy: YES([facility])  Spoke with Person/Company/Phone Number:: [phone](@ [employer] # [phone])  Co-Pay: $ 1.30(Q/L FOUR PILL PER DAY)  Prior Approval: No  Deductible: Met(LOWE INCOME SUBSIDY)  Additional Notes: MEDICAID [place] ACCESS(CO-PAY- $ 3.90 FOR EACH PRESCRIPTION)    Memory Argue Phone Number: 12/10/2018, 10:50 AM

## 2018-12-10 NOTE — Progress Notes (Signed)
IR consulted for possible thrombectomy of L arm AVG.  This was recently placed 10/27/18 and does not appear it has been successfully used for HD.  Patient still with R IJ tunneled dialysis catheter in place for dialysis in the interim.   Discussed case with Dr. Pascal Lux who has also discussed with Nephrology.  Due to the fact that graft has not been used successfully, would recommend OR exploration/revision.   Vascular has been consulted and is also following the patient.  No procedure planned in IR at this time.   Brynda Greathouse, MS RD PA-C

## 2018-12-10 NOTE — Consult Note (Addendum)
Hospital Consult    Reason for Consult:  Clotted L arm AVG Requesting Physician:  Dr. Jonnie Finner MRN #:  [id number]  History of Present Illness: This is a 63 y.o. female with ESRD on HD.  She was admitted for SOB and was negative for COVID 19.  She was "pulling clots" during HD treatment yesterday.  Today L arm AVG noted to be thrombosed.  This was recently placed by Dr. Carlis Abbott 10/27/18.  She also had L UE venogram at that time to evaluate central stenosis due to prior multiple fistula failures.  Central venogram was inconclusive however it was felt to be worth trial with L AVG prior to switching to R arm.  She is willing to consent to thrombectomy of L arm AVG prior to dialysis access placement in R arm.  She is currently dialyzing via R IJ TDC and next dialysis treatment scheduled for 12/11/18.  She had breakfast this morning.  She is not taking any blood thinners.  Past Medical History:  Diagnosis Date  . Diabetes mellitus without complication ([place])   . ESRD (end stage renal disease) on dialysis ([place])    "TTS; [place]; they're moving me to another one" (09/22/2018)  . Hepatitis C    diagnosed 09/2018  . Hypertension   . Oxygen deficiency    2 L at night  . Stroke ([place])   . Substance abuse ([place])    has been clean for 4 months     Past Surgical History:  Procedure Laterality Date  . AV FISTULA PLACEMENT Left 03/05/2018   Procedure: ARTERIOVENOUS (AV) FISTULA CREATION  LEFT UPPER EXTREMITY;  Surgeon: [name], MD;  Location: [place];  Service: Vascular;  Laterality: Left;  . AV FISTULA PLACEMENT Left 07/21/2018   Procedure: ARTERIOVENOUS (AV) FISTULA CREATION;  Surgeon: Marty Heck, MD;  Location: [place];  Service: Vascular;  Laterality: Left;  . AV FISTULA PLACEMENT Left 10/27/2018   Procedure: INSERTION OF ARTERIOVENOUS (AV) GORE-TEX GRAFT UPPER ARM;  Surgeon: Marty Heck, MD;  Location: [place];  Service: Vascular;  Laterality: Left;  . BASCILIC VEIN TRANSPOSITION  Left 09/29/2018   Procedure: BASILIC VEIN TRANSPOSITION SECOND STAGE LEFT UPPER ARM;  Surgeon: Marty Heck, MD;  Location: [place];  Service: Vascular;  Laterality: Left;  . HEMATOMA EVACUATION Left 03/06/2018   Procedure: EVACUATION HEMATOMA LEFT ARM;  Surgeon: Angelia Mould, MD;  Location: [place];  Service: Vascular;  Laterality: Left;  . I&D EXTREMITY Left 10/06/2018   Procedure: IRRIGATION AND DEBRIDEMENT ARM;  Surgeon: Marty Heck, MD;  Location: [place];  Service: Vascular;  Laterality: Left;  . INSERTION OF DIALYSIS CATHETER Left 04/29/2018   Procedure: INSERTION OF DIALYSIS CATHETER LEFT INTERNAL JUGULAR;  Surgeon: Angelia Mould, MD;  Location: [place];  Service: Vascular;  Laterality: Left;  . IR FLUORO GUIDE CV LINE RIGHT  02/18/2018  . IR FLUORO GUIDE CV LINE RIGHT  02/26/2018  . IR REMOVAL TUN CV CATH W/O FL  04/27/2018  . IR US GUIDE VASC ACCESS RIGHT  02/18/2018  . TEE WITHOUT CARDIOVERSION N/A 04/29/2018   Procedure: TRANSESOPHAGEAL ECHOCARDIOGRAM (TEE);  Surgeon: Sueanne Margarita, MD;  [facility] ENDOSCOPY;  Service: Cardiovascular;  Laterality: N/A;  . VENOGRAM Left 10/27/2018   Procedure: LEFT CENTRAL VENOGRAM;  Surgeon: Marty Heck, MD;  Location: [place];  Service: Vascular;  Laterality: Left;    Allergies  Allergen Reactions  . Acetaminophen Nausea And Vomiting    Patient states  Acetaminophen and Acetaminophen containing products make her nauseated. She demonstrated this 06/21/18 with nausea followed by emesis.  Marland Kitchen Hydroxyzine Itching    Prior to Admission medications   Medication Sig Start Date End Date Taking? Authorizing Provider  acetaminophen (TYLENOL) 500 MG tablet Take 1,000 mg by mouth every 6 (six) hours as needed for mild pain.   Yes [provider]  amLODipine (NORVASC) 5 MG tablet Take 1 tablet (5 mg total) by mouth at bedtime. 10/13/18  Yes Alphonzo Grieve, MD  B Complex-C-Zn-Folic Acid (DIALYVITE 109-NATF 15) 0.8 MG TABS  Take 1 tablet by mouth daily. 11/04/18  Yes [provider]  Blood Glucose Monitoring Suppl (ACCU-CHEK AVIVA) device Use as instructed 10/29/18 10/29/19 Yes Isabelle Course, MD  calcitRIOL (ROCALTROL) 0.25 MCG capsule Take 1 capsule (0.25 mcg total) by mouth every Monday, Wednesday, and Friday with hemodialysis. 10/29/18  Yes Isabelle Course, MD  diphenhydrAMINE (BENADRYL) 2 % cream Apply 1 application topically 3 (three) times daily as needed for itching.   Yes [provider]  fluticasone (FLONASE) 50 MCG/ACT nasal spray Place 2 sprays into both nostrils daily.   Yes [provider]  glucose blood (ACCU-CHEK AVIVA) test strip Use twice per day. 10/29/18  Yes Isabelle Course, MD  hydrOXYzine (ATARAX/VISTARIL) 25 MG tablet Take 25 mg by mouth 3 (three) times daily as needed for itching.   Yes [provider]  insulin aspart (NOVOLOG) 100 UNIT/ML injection Inject 5 Units into the skin 3 (three) times daily with meals. Patient taking differently: Inject 0-9 Units into the skin 3 (three) times daily with meals.  10/13/18  Yes Alphonzo Grieve, MD  insulin glargine (LANTUS) 100 UNIT/ML injection Inject 0.03 mLs (3 Units total) into the skin at bedtime. Patient taking differently: Inject 5 Units into the skin at bedtime.  10/29/18  Yes Isabelle Course, MD  Insulin Syringe-Needle U-100 (INSULIN SYRINGE .3CC/31GX5/16") 31G X 5/16" 0.3 ML MISC Inject 1 each into the skin 4 (four) times daily. 06/27/18  Yes Clent Demark, PA-C  Lancets [facility]) lancets Use twice per day. 10/29/18  Yes Isabelle Course, MD  pantoprazole (PROTONIX) 40 MG tablet Take 1 tablet (40 mg total) by mouth daily. 11/21/18  Yes Ghimire, Henreitta Leber, MD  sevelamer carbonate (RENVELA) 800 MG tablet Take 2 tablets (1,600 mg total) by mouth 3 (three) times daily with meals. 11/26/18  Yes Bonnell Public, MD    Social History   Socioeconomic History  . Marital status: Single    Spouse name: Not on  file  . Number of children: Not on file  . Years of education: Not on file  . Highest education level: Not on file  Occupational History  . Not on file  Social Needs  . Financial resource strain: Not on file  . Food insecurity:    Worry: Not on file    Inability: Not on file  . Transportation needs:    Medical: Not on file    Non-medical: Not on file  Tobacco Use  . Smoking status: Current Every Day Smoker    Packs/day: 0.50    Years: 48.00    Pack years: 24.00    Types: Cigarettes  . Smokeless tobacco: Never Used  . Tobacco comment: trying to quit ; smoking 1 1/2 cigarettes/day  (09/22/2018)  Substance and Sexual Activity  . Alcohol use: Not Currently    Comment: 09/22/2018 "stopped about 6 months ago"  . Drug use: Yes  Types: Cocaine, Heroin    Comment: See notes  . Sexual activity: Not Currently    Birth control/protection: None  Lifestyle  . Physical activity:    Days per week: Not on file    Minutes per session: Not on file  . Stress: Not on file  Relationships  . Social connections:    Talks on phone: Not on file    Gets together: Not on file    Attends religious service: Not on file    Active member of club or organization: Not on file    Attends meetings of clubs or organizations: Not on file    Relationship status: Not on file  . Intimate partner violence:    Fear of current or ex partner: Not on file    Emotionally abused: Not on file    Physically abused: Not on file    Forced sexual activity: Not on file  Other Topics Concern  . Not on file  Social History Narrative   Per notes, Pt lives with her son.     Family History  Problem Relation Age of Onset  . Autoimmune disease Neg Hx     ROS: Otherwise negative unless mentioned in HPI  Physical Examination  Vitals:   12/10/18 0520 12/10/18 0730  BP: (!) 125/59 129/65  Pulse: (!) 101 (!) 102  Resp: 18 18  Temp: 98.6 F (37 C) 98.5 F (36.9 C)  SpO2: 92% 95%   Body mass index is 22.58  kg/m.  General:  WDWN in NAD Gait: Not observed HENT: WNL, normocephalic Pulmonary: normal non-labored breathing Cardiac: regular Abdomen:  soft, NT/ND, no masses Skin: without rashes Vascular Exam/Pulses: palpable L radial pulse Extremities: no palpable thrill or audible bruit through L arm AVG  Neurologic: A&O X 3;  No focal weakness or paresthesias are detected; speech is fluent/normal Psychiatric:  The pt has Normal affect. Lymph:  Unremarkable  CBC    Component Value Date/Time   WBC 8.4 12/09/2018 0500   RBC 3.70 (L) 12/09/2018 0500   HGB 8.9 (L) 12/09/2018 0500   HGB 8.0 (L) 03/27/2018 1107   HCT 29.3 (L) 12/09/2018 0500   HCT 25.3 (L) 03/27/2018 1107   PLT 276 12/09/2018 0500   PLT 126 (L) 03/27/2018 1107   MCV 79.2 (L) 12/09/2018 0500   MCV 83 03/27/2018 1107   MCH 24.1 (L) 12/09/2018 0500   MCHC 30.4 12/09/2018 0500   RDW 20.3 (H) 12/09/2018 0500   RDW 19.9 (H) 03/27/2018 1107   LYMPHSABS 2.3 12/03/2018 1208   LYMPHSABS 0.5 (L) 03/27/2018 1107   MONOABS 0.9 12/03/2018 1208   EOSABS 0.0 12/03/2018 1208   EOSABS 0.0 03/27/2018 1107   BASOSABS 0.1 12/03/2018 1208   BASOSABS 0.0 03/27/2018 1107    BMET    Component Value Date/Time   NA 134 (L) 12/09/2018 0500   NA 135 03/25/2018 1014   K 3.9 12/09/2018 0500   CL 99 12/09/2018 0500   CO2 21 (L) 12/09/2018 0500   GLUCOSE 93 12/09/2018 0500   BUN 24 (H) 12/09/2018 0500   BUN 96 (HH) 03/25/2018 1014   CREATININE 6.78 (H) 12/09/2018 0500   CALCIUM 8.9 12/09/2018 0500   CALCIUM 8.3 (L) 09/23/2018 1329   GFRNONAA 6 (L) 12/09/2018 0500   GFRAA 7 (L) 12/09/2018 0500    COAGS: Lab Results  Component Value Date   INR 0.94 10/27/2018   INR 0.96 09/29/2018   INR 0.97 07/23/2018     ASSESSMENT/PLAN: This is  a 63 y.o. female with thrombosed L arm AVG  Thrombosed L arm AVG without thrill or bruit Patient willing to try thrombectomy prior to R arm dialysis access placement Patient had breakfast this  morning Dr. Donzetta Matters will discuss with Dr. Jonnie Finner recommendation for IR to attempt thrombectomy    Dagoberto Ligas PA-C [facility] [phone]   I have independently interviewed and examined patient and agree with PA assessment and plan above.  Patient will benefit from interventional thrombectomy with possible central venous intervention as well.  If this is not possible we can plan right arm access in the future but patient currently has catheter for dialysis.  Kaija Kovacevic C. Donzetta Matters, MD [facility] [place] Office: [phone] Pager: [phone]   Addendum: discussed with nephrology and IR and will plan left arm open thrombectomy tomorrow as last ditch effort to use left arm prior to using right.   Allard Lightsey C. Donzetta Matters

## 2018-12-11 ENCOUNTER — Inpatient Hospital Stay (HOSPITAL_COMMUNITY): Payer: Medicare Other | Admitting: Certified Registered Nurse Anesthetist

## 2018-12-11 ENCOUNTER — Encounter (HOSPITAL_COMMUNITY)
Admission: EM | Disposition: A | Discharge status: Home / Self Care | Payer: Self-pay | Source: Home / Self Care | Attending: Emergency Medicine

## 2018-12-11 ENCOUNTER — Encounter (HOSPITAL_COMMUNITY): Payer: Self-pay | Admitting: *Deleted

## 2018-12-11 DIAGNOSIS — Z992 Dependence on renal dialysis: Secondary | ICD-10-CM

## 2018-12-11 DIAGNOSIS — T82868A Thrombosis of vascular prosthetic devices, implants and grafts, initial encounter: Secondary | ICD-10-CM

## 2018-12-11 DIAGNOSIS — N186 End stage renal disease: Secondary | ICD-10-CM

## 2018-12-11 HISTORY — PX: THROMBECTOMY AND REVISION OF ARTERIOVENTOUS (AV) GORETEX  GRAFT: SHX6120

## 2018-12-11 LAB — CBC
HCT: 34.4 % — ABNORMAL LOW (ref 36.0–46.0)
Hemoglobin: 10.3 g/dL — ABNORMAL LOW (ref 12.0–15.0)
MCH: 23.6 pg — ABNORMAL LOW (ref 26.0–34.0)
MCHC: 29.9 g/dL — ABNORMAL LOW (ref 30.0–36.0)
MCV: 78.9 fL — ABNORMAL LOW (ref 80.0–100.0)
Platelets: 274 10*3/uL (ref 150–400)
RBC: 4.36 MIL/uL (ref 3.87–5.11)
RDW: 20.4 % — ABNORMAL HIGH (ref 11.5–15.5)
WBC: 14.4 10*3/uL — ABNORMAL HIGH (ref 4.0–10.5)
nRBC: 0 % (ref 0.0–0.2)

## 2018-12-11 LAB — RENAL FUNCTION PANEL
Albumin: 2.8 g/dL — ABNORMAL LOW (ref 3.5–5.0)
Anion gap: 10 (ref 5–15)
BUN: 5 mg/dL — ABNORMAL LOW (ref 8–23)
CO2: 26 mmol/L (ref 22–32)
Calcium: 8.2 mg/dL — ABNORMAL LOW (ref 8.9–10.3)
Chloride: 96 mmol/L — ABNORMAL LOW (ref 98–111)
Creatinine, Ser: 2.83 mg/dL — ABNORMAL HIGH (ref 0.44–1.00)
GFR calc Af Amer: 19 mL/min — ABNORMAL LOW (ref >60)
GFR calc non Af Amer: 17 mL/min — ABNORMAL LOW (ref >60)
Glucose, Bld: 127 mg/dL — ABNORMAL HIGH (ref 70–99)
Phosphorus: 2.2 mg/dL — ABNORMAL LOW (ref 2.5–4.6)
Potassium: 3.6 mmol/L (ref 3.5–5.1)
Sodium: 132 mmol/L — ABNORMAL LOW (ref 135–145)

## 2018-12-11 LAB — POCT I-STAT 4, (NA,K, GLUC, HGB,HCT)
Glucose, Bld: 93 mg/dL (ref 70–99)
HCT: 33 % — ABNORMAL LOW (ref 36.0–46.0)
Hemoglobin: 11.2 g/dL — ABNORMAL LOW (ref 12.0–15.0)
Potassium: 3.9 mmol/L (ref 3.5–5.1)
Sodium: 135 mmol/L (ref 135–145)

## 2018-12-11 LAB — GLUCOSE, CAPILLARY
Glucose-Capillary: 92 mg/dL (ref 70–99)
Glucose-Capillary: 91 mg/dL (ref 70–99)
Glucose-Capillary: 222 mg/dL — ABNORMAL HIGH (ref 70–99)
Glucose-Capillary: 158 mg/dL — ABNORMAL HIGH (ref 70–99)

## 2018-12-11 SURGERY — THROMBECTOMY AND REVISION OF ARTERIOVENTOUS (AV) GORETEX  GRAFT
Anesthesia: General | Site: Arm Upper | Laterality: Left

## 2018-12-11 MED ORDER — LIDOCAINE-EPINEPHRINE 0.5 %-1:200000 IJ SOLN
INTRAMUSCULAR | Status: AC
  Filled 2018-12-11: qty 1

## 2018-12-11 MED ORDER — PHENYLEPHRINE 40 MCG/ML (10ML) SYRINGE FOR IV PUSH (FOR BLOOD PRESSURE SUPPORT)
PREFILLED_SYRINGE | INTRAVENOUS | Status: DC | PRN
  Administered 2018-12-11: 120 ug via INTRAVENOUS
  Administered 2018-12-11: 80 ug via INTRAVENOUS

## 2018-12-11 MED ORDER — CEFAZOLIN SODIUM-DEXTROSE 1-4 GM/50ML-% IV SOLN
INTRAVENOUS | Status: DC | PRN
  Administered 2018-12-11: 1 g via INTRAVENOUS

## 2018-12-11 MED ORDER — LIDOCAINE HCL (PF) 1 % IJ SOLN: 5.0000 mL | INTRAMUSCULAR | Status: DC | PRN

## 2018-12-11 MED ORDER — SODIUM CHLORIDE 0.9 % IV SOLN
INTRAVENOUS | Status: AC
  Filled 2018-12-11: qty 1.2

## 2018-12-11 MED ORDER — SODIUM CHLORIDE 0.9 % IV SOLN
INTRAVENOUS | Status: DC | PRN
  Administered 2018-12-11: 500 mL

## 2018-12-11 MED ORDER — FENTANYL CITRATE (PF) 250 MCG/5ML IJ SOLN
INTRAMUSCULAR | Status: DC | PRN
  Administered 2018-12-11: 50 ug via INTRAVENOUS
  Administered 2018-12-11: 25 ug via INTRAVENOUS
  Administered 2018-12-11 (×2): 50 ug via INTRAVENOUS
  Administered 2018-12-11: 25 ug via INTRAVENOUS
  Administered 2018-12-11: 50 ug via INTRAVENOUS

## 2018-12-11 MED ORDER — SODIUM CHLORIDE 0.9 % IV SOLN
INTRAVENOUS | Status: DC
  Administered 2018-12-11: 09:00:00 via INTRAVENOUS

## 2018-12-11 MED ORDER — ALTEPLASE 2 MG IJ SOLR
2.0000 mg | Freq: Once | INTRAMUSCULAR | Status: DC | PRN
  Filled 2018-12-11: qty 2

## 2018-12-11 MED ORDER — LIDOCAINE-PRILOCAINE 2.5-2.5 % EX CREA: 1.0000 "application " | TOPICAL_CREAM | CUTANEOUS | Status: DC | PRN

## 2018-12-11 MED ORDER — SODIUM CHLORIDE 0.9 % IV SOLN: 100.0000 mL | INTRAVENOUS | Status: DC | PRN

## 2018-12-11 MED ORDER — 0.9 % SODIUM CHLORIDE (POUR BTL) OPTIME
TOPICAL | Status: DC | PRN
  Administered 2018-12-11: 10:00:00 1000 mL

## 2018-12-11 MED ORDER — DEXAMETHASONE SODIUM PHOSPHATE 10 MG/ML IJ SOLN
INTRAMUSCULAR | Status: DC | PRN
  Administered 2018-12-11: 5 mg via INTRAVENOUS

## 2018-12-11 MED ORDER — PROPOFOL 10 MG/ML IV BOLUS
INTRAVENOUS | Status: DC | PRN
  Administered 2018-12-11: 150 mg via INTRAVENOUS

## 2018-12-11 MED ORDER — SUCCINYLCHOLINE CHLORIDE 20 MG/ML IJ SOLN
INTRAMUSCULAR | Status: DC | PRN
  Administered 2018-12-11: 120 mg via INTRAVENOUS

## 2018-12-11 MED ORDER — LIDOCAINE 2% (20 MG/ML) 5 ML SYRINGE
INTRAMUSCULAR | Status: DC | PRN
  Administered 2018-12-11: 60 mg via INTRAVENOUS

## 2018-12-11 MED ORDER — SODIUM CHLORIDE 0.9 % IV SOLN
INTRAVENOUS | Status: DC | PRN
  Administered 2018-12-11: 30 ug/min via INTRAVENOUS

## 2018-12-11 MED ORDER — MIDAZOLAM HCL 2 MG/2ML IJ SOLN
INTRAMUSCULAR | Status: AC
  Filled 2018-12-11: qty 2

## 2018-12-11 MED ORDER — PENTAFLUOROPROP-TETRAFLUOROETH EX AERO: 1.0000 "application " | INHALATION_SPRAY | CUTANEOUS | Status: DC | PRN

## 2018-12-11 MED ORDER — FENTANYL CITRATE (PF) 250 MCG/5ML IJ SOLN
INTRAMUSCULAR | Status: AC
  Filled 2018-12-11: qty 5

## 2018-12-11 MED ORDER — LIDOCAINE-EPINEPHRINE 0.5 %-1:200000 IJ SOLN
INTRAMUSCULAR | Status: DC | PRN
  Administered 2018-12-11: 4 mL

## 2018-12-11 MED ORDER — ONDANSETRON HCL 4 MG/2ML IJ SOLN
INTRAMUSCULAR | Status: DC | PRN
  Administered 2018-12-11: 4 mg via INTRAVENOUS

## 2018-12-11 SURGICAL SUPPLY — 38 items
ADH SKN CLS APL DERMABOND .7 (GAUZE/BANDAGES/DRESSINGS) ×1
ARMBAND PINK RESTRICT EXTREMIT (MISCELLANEOUS) ×6 IMPLANT
CANISTER SUCT 3000ML PPV (MISCELLANEOUS) ×3 IMPLANT
CANNULA VESSEL 3MM 2 BLNT TIP (CANNULA) ×3 IMPLANT
CATH EMB 4FR 80CM (CATHETERS) ×5 IMPLANT
CLIP LIGATING EXTRA MED SLVR (CLIP) ×3 IMPLANT
CLIP LIGATING EXTRA SM BLUE (MISCELLANEOUS) ×3 IMPLANT
COVER SURGICAL LIGHT HANDLE (MISCELLANEOUS) ×2 IMPLANT
COVER WAND RF STERILE (DRAPES) ×1 IMPLANT
DECANTER SPIKE VIAL GLASS SM (MISCELLANEOUS) ×3 IMPLANT
DERMABOND ADVANCED (GAUZE/BANDAGES/DRESSINGS) ×2
DERMABOND ADVANCED .7 DNX12 (GAUZE/BANDAGES/DRESSINGS) ×1 IMPLANT
ELECT REM PT RETURN 9FT ADLT (ELECTROSURGICAL) ×3
ELECTRODE REM PT RTRN 9FT ADLT (ELECTROSURGICAL) ×1 IMPLANT
GLOVE BIO SURGEON STRL SZ 6.5 (GLOVE) ×1 IMPLANT
GLOVE BIO SURGEON STRL SZ7 (GLOVE) ×2 IMPLANT
GLOVE BIO SURGEONS STRL SZ 6.5 (GLOVE) ×1
GLOVE BIOGEL PI IND STRL 7.0 (GLOVE) IMPLANT
GLOVE BIOGEL PI IND STRL 7.5 (GLOVE) IMPLANT
GLOVE BIOGEL PI INDICATOR 7.0 (GLOVE) ×2
GLOVE BIOGEL PI INDICATOR 7.5 (GLOVE) ×2
GLOVE ECLIPSE 7.0 STRL STRAW (GLOVE) ×2 IMPLANT
GLOVE SS BIOGEL STRL SZ 7.5 (GLOVE) ×1 IMPLANT
GLOVE SUPERSENSE BIOGEL SZ 7.5 (GLOVE) ×2
GOWN STRL REUS W/ TWL LRG LVL3 (GOWN DISPOSABLE) ×3 IMPLANT
GOWN STRL REUS W/TWL LRG LVL3 (GOWN DISPOSABLE) ×12
GRAFT GORETEX 6X10 (Vascular Products) ×2 IMPLANT
KIT BASIN OR (CUSTOM PROCEDURE TRAY) ×3 IMPLANT
KIT TURNOVER KIT B (KITS) ×3 IMPLANT
NS IRRIG 1000ML POUR BTL (IV SOLUTION) ×3 IMPLANT
PACK CV ACCESS (CUSTOM PROCEDURE TRAY) ×3 IMPLANT
PAD ARMBOARD 7.5X6 YLW CONV (MISCELLANEOUS) ×6 IMPLANT
SUT PROLENE 6 0 CC (SUTURE) ×7 IMPLANT
SUT VIC AB 3-0 SH 27 (SUTURE) ×6
SUT VIC AB 3-0 SH 27X BRD (SUTURE) ×1 IMPLANT
TOWEL GREEN STERILE (TOWEL DISPOSABLE) ×3 IMPLANT
UNDERPAD 30X30 (UNDERPADS AND DIAPERS) ×3 IMPLANT
WATER STERILE IRR 1000ML POUR (IV SOLUTION) ×3 IMPLANT

## 2018-12-11 NOTE — Anesthesia Preprocedure Evaluation (Addendum)
Anesthesia Evaluation  Patient identified by MRN, date of birth, ID band Patient awake    Reviewed: Allergy & Precautions, NPO status , Patient's Chart, lab work & pertinent test results  Airway Mallampati: II  TM Distance: >3 FB     Dental   Pulmonary shortness of breath, pneumonia, Current Smoker,    breath sounds clear to auscultation       Cardiovascular hypertension,  Rhythm:Regular Rate:Normal     Neuro/Psych    GI/Hepatic negative GI ROS, (+) Hepatitis -  Endo/Other  diabetes  Renal/GU Renal disease     Musculoskeletal   Abdominal   Peds  Hematology   Anesthesia Other Findings   Reproductive/Obstetrics                             Anesthesia Physical Anesthesia Plan  ASA: III  Anesthesia Plan: General   Post-op Pain Management:    Induction: Intravenous  PONV Risk Score and Plan: Ondansetron and Treatment may vary due to age or medical condition  Airway Management Planned: Oral ETT  Additional Equipment:   Intra-op Plan:   Post-operative Plan: Possible Post-op intubation/ventilation  Informed Consent: I have reviewed the patients History and Physical, chart, labs and discussed the procedure including the risks, benefits and alternatives for the proposed anesthesia with the patient or authorized representative who has indicated his/her understanding and acceptance.     Dental advisory given  Plan Discussed with: CRNA and Anesthesiologist  Anesthesia Plan Comments:         Anesthesia Quick Evaluation

## 2018-12-11 NOTE — Progress Notes (Signed)
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 She had a partial HD tx on 3/21, and then missed her subsequent txs.   She had been admitted 3/8 > 3/13 with B lung infiltrates c/w pulmonary edema vs PNA in the setting of missed dialysis. She was cocaine positive. She was again admitted 3/17 - 3/18 with AMS thought related to cocaine overdose.   Patient reports having partial iHD on 3/21. Denies sick contacts, fever, known COVID -19 exposure, travel, or recent drug use. She was found by EMS hypoxic with sats in the 80's. In ER, she remained hypertensive, hypoxic, rectal temp 100.5. CXR showed bilateral infiltrates. Flu neg. Labs noted for K 5.4, glucose 120, BUN 83, sCr 13.17, WBC 14.1, Hgb 10.7, Lactic 2.4, trop I 0.18 and BNP >4500, EKG non acute. She was treated with empiric abx coverage with vancomycin, cefepime, and flagyl, not continued  Subjective: -feeling well, no specific complaints.  No shortness of breath, no abdominal pain, no nausea or vomiting.  Assessment & Plan: Active Problems:   Acute respiratory failure with hypoxia (HCC)   Principal Problem Acute hypoxic respiratory failure - B infiltrates / acute pulmonary edema  -Cocaine injury (SARS CoV-2 NEGATIVE), resolved, she is on room air this morning  Active problems ESRD - T/Th/Sat HD /clotted left AV graft -Vascular surgery consulted, patient to be taken to the OR today for thrombectomy -Dialysis per nephrology  C.Diff colitis  -Improving, continue oral vancomycin   HTN -initial hypertensive urgency insetting of cocaine abuse, non-compliance, and  missed HD -Pressure overall stable  DM2  -Continue sliding scale  Substance abuse -Has been counseled on absolute need to abstain  Severe medical non-compliance  -Has been counseled on absolute need to comply w/ meds and HD tx   N/V w/ Small amount hematemesis at time of presentation -CT abdomen without any abnormality - stop PPI and begin H2 blocker   Chronic Normocytic Anemia of ESRD -epo and Fe per Nephrology   Trichomonas on UA -Finished a course of Flagyl   Scheduled Meds: . [MAR Hold] amLODipine  5 mg Oral Daily  . [MAR Hold] Chlorhexidine Gluconate Cloth  6 each Topical Q0600  . [MAR Hold] Chlorhexidine Gluconate Cloth  6 each Topical Q0600  . [MAR Hold] darbepoetin (ARANESP) injection - DIALYSIS  100 mcg Intravenous Q Tue-HD  . [MAR Hold] doxercalciferol  3 mcg Intravenous Q M,W,F-HD  . [MAR Hold] famotidine  10 mg Oral Daily  . [MAR Hold] feeding supplement  1 Container Oral Q24H  . [MAR Hold] feeding supplement (NEPRO CARB STEADY)  237 mL Oral BID BM  . [MAR Hold] feeding supplement (PRO-STAT SUGAR FREE 64)  30 mL Oral BID  . [MAR Hold] fluticasone  2 spray Each Nare Daily  . [MAR Hold] heparin  5,000 Units Subcutaneous Q8H  . [MAR Hold] insulin aspart  0-5 Units Subcutaneous QHS  . [MAR Hold] insulin aspart  0-9 Units Subcutaneous TID WC  . [MAR Hold] mouth rinse  15 mL Mouth Rinse BID  . [MAR Hold] multivitamin  1 tablet Oral QHS  . [MAR Hold] sevelamer carbonate  1,600 mg  Oral TID WC  . [MAR Hold] vancomycin  125 mg Oral QID   Continuous Infusions: . sodium chloride 10 mL/hr at 12/11/18 0838   PRN Meds:.0.9 % irrigation (POUR BTL), [MAR Hold] camphor-menthol, [MAR Hold] diphenhydrAMINE, [MAR Hold] fentaNYL (SUBLIMAZE) injection, heparin irrigation 6000 unit, [MAR Hold] hydrALAZINE, [MAR Hold] hyoscyamine, lidocaine-EPINEPHrine, [MAR Hold] LORazepam, [MAR Hold] traMADol  DVT prophylaxis: Heparin Code Status: Full code Family Communication: No family  present at bedside Disposition Plan: Home when cleared by vascular surgery and nephrology  Consultants:   Nephrology  Vascular surgery  Procedures:   None   Antimicrobials:  Po vancomycin    Objective: Vitals:   12/10/18 0730 12/10/18 2057 12/11/18 0316 12/11/18 0834  BP: 129/65 (!) 156/97 (!) 146/95   Pulse: (!) 102 99 (!) 102   Resp: 18 20 18    Temp: 98.5 F (36.9 C) 98.5 F (36.9 C) 98.7 F (37.1 C)   TempSrc: Oral Oral Oral   SpO2: 95% 95% 97%   Weight:   55.6 kg 57.2 kg  Height:    5\' 2"  (1.575 m)    Intake/Output Summary (Last 24 hours) at 12/11/2018 0956 Last data filed at 12/11/2018 0600 Gross per 24 hour  Intake 720 ml  Output 0 ml  Net 720 ml   Filed Weights   12/10/18 0520 12/11/18 0316 12/11/18 0834  Weight: 56 kg 55.6 kg 57.2 kg    Examination:  Constitutional: NAD Eyes: No icterus ENMT: Moist membranes Respiratory: Clear to auscultation bilaterally without wheezing or crackles Cardiovascular: Regular rate and rhythm, no murmurs appreciated Abdomen: Soft, nontender, nondistended, positive bowel sounds Skin: No rashes seen Neurologic: No focal deficits, ambulatory Psychiatric: Normal judgment and insight. Alert and oriented x 3. Normal mood.    Data Reviewed: I have independently reviewed following labs and imaging studies   CBC: Recent Labs  Lab 12/05/18 0422 12/06/18 0244 12/07/18 0457 12/08/18 0339 12/09/18 0500 12/11/18 0841  WBC 7.7 8.3 9.3 8.2 8.4  --   HGB 8.2* 8.1* 9.0* 8.8* 8.9* 11.2*  HCT 27.3* 25.3* 28.9* 28.6* 29.3* 33.0*  MCV 80.3 78.8* 79.8* 80.1 79.2*  --   PLT 189 187 245 245 276  --    Basic Metabolic Panel: Recent Labs  Lab 12/05/18 0422 12/06/18 0244 12/07/18 0457 12/08/18 0339 12/09/18 0500 12/11/18 0841  NA 138 135 136 134* 134* 135  K 4.1 3.3* 3.6 3.5 3.9 3.9  CL 101 100 101 98 99  --   CO2 23 25 24 23  21*  --   GLUCOSE 128* 92 89 94 93 93  BUN 60* 27* 42* 13 24*  --   CREATININE 8.78* 5.19* 7.80*  4.30* 6.78*  --   CALCIUM 8.1* 8.1* 8.5* 8.5* 8.9  --   MG 2.1 1.9 2.1 1.9  --   --   PHOS  --  4.2  --   --   --   --    GFR: Estimated Creatinine Clearance: 6.5 mL/min (A) (by C-G formula based on SCr of 6.78 mg/dL (H)). Liver Function Tests: Recent Labs  Lab 12/05/18 0422  AST 24  ALT 15  ALKPHOS 60  BILITOT 0.8  PROT 6.5  ALBUMIN 2.0*   Recent Labs  Lab 12/05/18 0422  LIPASE 29   No results for input(s): AMMONIA in the last 168 hours. Coagulation Profile: No results for input(s): INR, PROTIME in the last 168 hours. Cardiac Enzymes: No results for input(s): CKTOTAL, CKMB, CKMBINDEX, TROPONINI in the last 168 hours.  BNP (last 3 results) No results for input(s): PROBNP in the last 8760 hours. HbA1C: No results for input(s): HGBA1C in the last 72 hours. CBG: Recent Labs  Lab 12/10/18 0723 12/10/18 1109 12/10/18 1724 12/10/18 2103 12/11/18 0739  GLUCAP 125* 103* 123* 108* 92   Lipid Profile: No results for input(s): CHOL, HDL, LDLCALC, TRIG, CHOLHDL, LDLDIRECT in the last 72 hours. Thyroid Function Tests: No results for input(s): TSH, T4TOTAL, FREET4, T3FREE, THYROIDAB in the last 72 hours. Anemia Panel: No results for input(s): VITAMINB12, FOLATE, FERRITIN, TIBC, IRON, RETICCTPCT in the last 72 hours. Urine analysis:    Component Value Date/Time   COLORURINE YELLOW 12/03/2018 1208   APPEARANCEUR HAZY (A) 12/03/2018 1208   LABSPEC 1.020 12/03/2018 1208   PHURINE 6.0 12/03/2018 1208   GLUCOSEU 100 (A) 12/03/2018 1208   HGBUR LARGE (A) 12/03/2018 1208   HGBUR trace-intact 03/08/2010 1452   BILIRUBINUR NEGATIVE 12/03/2018 [place] 12/03/2018 1208   PROTEINUR >300 (A) 12/03/2018 1208   UROBILINOGEN 0.2 03/08/2010 1452   NITRITE NEGATIVE 12/03/2018 1208   LEUKOCYTESUR TRACE (A) 12/03/2018 1208   Sepsis Labs: Invalid input(s): PROCALCITONIN, LACTICIDVEN  Recent Results (from the past 240 hour(s))  Blood Culture (routine x 2)     Status:  None   Collection Time: 12/03/18 12:08 PM  Result Value Ref Range Status   Specimen Description BLOOD RIGHT HAND  Final   Special Requests   Final    BOTTLES DRAWN AEROBIC AND ANAEROBIC Blood Culture results may not be optimal due to an inadequate volume of blood received in culture bottles   Culture   Final    NO GROWTH 5 DAYS Performed at [facility], [place] [address], [place], [place] [address]    Report Status 12/08/2018 FINAL  Final  Blood Culture (routine x 2)     Status: None   Collection Time: 12/03/18 12:13 PM  Result Value Ref Range Status   Specimen Description BLOOD RIGHT WRIST  Final   Special Requests   Final    BOTTLES DRAWN AEROBIC AND ANAEROBIC Blood Culture results may not be optimal due to an inadequate volume of blood received in culture bottles   Culture   Final    NO GROWTH 5 DAYS Performed at [facility], [place] [address] [place], [place] [address]    Report Status 12/08/2018 FINAL  Final  Respiratory Panel by PCR     Status: None   Collection Time: 12/03/18  1:13 PM  Result Value Ref Range Status   Adenovirus NOT DETECTED NOT DETECTED Final   Coronavirus 229E NOT DETECTED NOT DETECTED Final    Comment: (NOTE) The Coronavirus on the Respiratory Panel, DOES NOT test for the novel  Coronavirus (2019 nCoV)    Coronavirus HKU1 NOT DETECTED NOT DETECTED Final   Coronavirus NL63 NOT DETECTED NOT DETECTED Final   Coronavirus OC43 NOT DETECTED NOT DETECTED Final   Metapneumovirus NOT DETECTED NOT DETECTED Final   Rhinovirus / Enterovirus NOT DETECTED NOT DETECTED Final   Influenza A NOT DETECTED NOT DETECTED Final   Influenza B NOT DETECTED NOT DETECTED Final   Parainfluenza Virus 1 NOT DETECTED NOT DETECTED Final   Parainfluenza Virus 2 NOT DETECTED NOT DETECTED Final   Parainfluenza Virus 3 NOT DETECTED NOT DETECTED Final   Parainfluenza Virus 4 NOT DETECTED NOT DETECTED Final   Respiratory Syncytial Virus NOT DETECTED NOT DETECTED Final    Bordetella pertussis NOT DETECTED NOT DETECTED Final  Chlamydophila pneumoniae NOT DETECTED NOT DETECTED Final   Mycoplasma pneumoniae NOT DETECTED NOT DETECTED Final    Comment: Performed at [facility], [place] [address], [place], [place] [address]  Novel Coronavirus, NAA (hospital order; send-out to ref lab)     Status: None   Collection Time: 12/03/18 11:13 PM  Result Value Ref Range Status   SARS-CoV-2, NAA NOT DETECTED NOT DETECTED Final    Comment: (NOTE) Testing was performed using the cobas(R) SARS-CoV-2 test. This test was developed and its performance characteristics determined by [employer]. This test has not been FDA cleared or approved. This test has been authorized by FDA under an Emergency Use Authorization (EUA). This test is only authorized for the duration of time the declaration that circumstances exist justifying the authorization of the emergency use of in vitro diagnostic tests for detection of SARS-CoV-2 virus and/or diagnosis of COVID-19 infection under section 564(b)(1) of the Act, 21 U.S.C. 333LKT-6(Y)(5), unless the authorization is terminated or revoked sooner. Performed At: [facility] [address] [place], [place] [id number] Rush Farmer MD AJ:[id number]    [place]  Final    Comment: Performed at [facility], [place] [address] [place], [place] [address]  Novel Coronavirus, NAA (hospital order; send-out to ref lab)     Status: None   Collection Time: 12/05/18 12:53 PM  Result Value Ref Range Status   SARS-CoV-2, NAA NOT DETECTED NOT DETECTED Final    Comment: (NOTE) Testing was performed using the cobas(R) SARS-CoV-2 test. This test was developed and its performance characteristics determined by [employer]. This test has not been FDA cleared or approved. This test has been authorized by FDA under an Emergency Use Authorization (EUA). This test is only authorized for the duration of  time the declaration that circumstances exist justifying the authorization of the emergency use of in vitro diagnostic tests for detection of SARS-CoV-2 virus and/or diagnosis of COVID-19 infection under section 564(b)(1) of the Act, 21 U.S.C. 416LAG-5(X)(6), unless the authorization is terminated or revoked sooner. Performed At: [facility] [address] [place], [place] [id number] Rush Farmer MD QM:[id number]    Coronavirus Source NASAL SWAB  Corrected    Comment: Performed at [facility], [place] [address] [place], [place] [address] CORRECTED ON 03/27 AT 1625: PREVIOUSLY REPORTED AS NASOPHARYNGEAL   C difficile quick scan w PCR reflex     Status: Abnormal   Collection Time: 12/07/18 12:49 PM  Result Value Ref Range Status   C Diff antigen POSITIVE (A) NEGATIVE Final   C Diff toxin NEGATIVE NEGATIVE Final   C Diff interpretation Results are indeterminate. See PCR results.  Final    Comment: Performed at [facility], [place] [address], [place], [place] [address]  C. Diff by PCR, Reflexed     Status: Abnormal   Collection Time: 12/07/18 12:49 PM  Result Value Ref Range Status   Toxigenic C. Difficile by PCR POSITIVE (A) NEGATIVE Final    Comment: Positive for toxigenic C. difficile with little to no toxin production. Only treat if clinical presentation suggests symptomatic illness. Performed at [facility], [place] [address], [place], [place] [address]       Radiology Studies: No results found.  Marzetta Board, MD, PhD [facility]  Contact via  [email or web address]  [place] P: [phone]  F: [phone]

## 2018-12-11 NOTE — Op Note (Signed)
    OPERATIVE REPORT  DATE OF SURGERY: 12/11/2018  PATIENT: Rhonda Lynch, 63 y.o. female MRN: [id number]  DOB: 03/24/1956  PRE-OPERATIVE DIAGNOSIS: End-stage renal disease with occluded left upper arm AV Gore-Tex graft  POST-OPERATIVE DIAGNOSIS:  Same  PROCEDURE: Thrombectomy and revision of left upper arm AV Gore-Tex graft with exploration of venous anastomosis and revision of arterial anastomosis  SURGEON:  [name], M.D.  PHYSICIAN ASSISTANT: Laurence Slate, PA-C  ANESTHESIA: General  EBL: per anesthesia record  Total I/O In: -  Out: 50 [Blood:50]  BLOOD ADMINISTERED: none  DRAINS: none  SPECIMEN: none  COUNTS CORRECT:  YES  PATIENT DISPOSITION:  PACU - hemodynamically stable  PROCEDURE DETAILS: Patient was taken to the operating placed supine position where the area of the left arm and left axilla were prepped and draped in sterile fashion.  Vision was made through the prior scar and carried down to isolate the Gore-Tex graft to the axillary vein.  This was an end-to-end anastomosis.  The graft was opened through a prior revision site.  The venous anastomosis was thrombectomized and it was widely patent.  The graft itself was thrombectomized.  Arterialized plug was removed and felt that this was rather small.  There was very poor inflow.  The graft was flushed with heparinized saline and reoccluded.  Next an incision was made over the arterial anastomosis.  The patient had a large caliber brachial artery.  There had been a 4 7 tapered graft placed.  The brachial artery was exposed proximal distal to the old arterial anastomosis.  The graft was exposed further distally and was clamped.  The brachial artery was occluded proximally and distally and the graft was removed completely from the brachial artery.  The arteriotomy was opened further and a new interposition graft of 6 mm Gore-Tex was brought onto the field slightly spatulated and sewn end-to-side to the brachial artery  with a running 6-0 Prolene suture.  This was anastomosis was tested and found to be adequate.  The graft was then sewn into into the old graft and an approximately 6 mm portion of the 4 7 tapered graft.  Clamps removed and good flow was noted.  The wounds were irrigated with saline.  Hemostasis to electrocautery.  The wounds were closed with 3-0 Vicryl in the subcutaneous and subcuticular tissue.  Sterile dressing was applied and the patient was transferred to the recovery room in stable condition   [name], M.D., [facility] 12/11/2018 11:53 AM

## 2018-12-11 NOTE — Interval H&P Note (Signed)
History and Physical Interval Note:  12/11/2018 9:52 AM  Rhonda Lynch  has presented today for surgery, with the diagnosis of CLOTTED ARTERIOVENOUS GRAFT LEFT ARM.  The various methods of treatment have been discussed with the patient and family. After consideration of risks, benefits and other options for treatment, the patient has consented to  Procedure(s): THROMBECTOMY AND POSSIBLE REVISION  ARTERIOVENTOUS (AV) GORETEX  GRAFT LEFT ARM (Left) as a surgical intervention.  [name] history has been reviewed, patient examined, no change in status, stable for surgery.  I have reviewed the patient's chart and labs.  Questions were answered to the patient's satisfaction.     [name]

## 2018-12-11 NOTE — Transfer of Care (Signed)
Immediate Anesthesia Transfer of Care Note  Patient: Rhonda Lynch  Procedure(s) Performed: [name] AND  REVISION  ARTERIOVENTOUS [name]  GRAFT LEFT ARM (Left Arm Upper)  Patient Location: PACU  Anesthesia Type:General  Level of Consciousness: drowsy and patient cooperative  Airway & Oxygen Therapy: Patient Spontanous Breathing and Patient connected to face mask oxygen  Post-op Assessment: Report given to RN, Post -op Vital signs reviewed and stable and Patient moving all extremities X 4  Post vital signs: Reviewed and stable  Last Vitals:  Vitals Value Taken Time  BP 139/75 12/11/2018 12:00 PM  Temp    Pulse 99 12/11/2018 12:03 PM  Resp 19 12/11/2018 12:03 PM  SpO2 100 % 12/11/2018 12:03 PM  Vitals shown include unvalidated device data.  Last Pain:  Vitals:   12/11/18 0834  TempSrc:   PainSc: 7       Patients Stated Pain Goal: 3 (53/29/92 4268)  Complications: No apparent anesthesia complications

## 2018-12-11 NOTE — Interval H&P Note (Signed)
History and Physical Interval Note:  12/11/2018 9:51 AM  Rhonda Lynch  has presented today for surgery, with the diagnosis of CLOTTED ARTERIOVENOUS GRAFT LEFT ARM.  The various methods of treatment have been discussed with the patient and family. After consideration of risks, benefits and other options for treatment, the patient has consented to  Procedure(s): THROMBECTOMY AND POSSIBLE REVISION  ARTERIOVENTOUS (AV) GORETEX  GRAFT LEFT ARM (Left) as a surgical intervention.  [name] history has been reviewed, patient examined, no change in status, stable for surgery.  I have reviewed the patient's chart and labs.  Questions were answered to the patient's satisfaction.     [name]

## 2018-12-11 NOTE — Anesthesia Procedure Notes (Signed)
Procedure Name: Intubation Date/Time: 12/11/2018 10:32 AM Performed by: Julieta Bellini, CRNA Pre-anesthesia Checklist: Patient identified, Emergency Drugs available, Suction available and Patient being monitored Patient Re-evaluated:Patient Re-evaluated prior to induction Oxygen Delivery Method: Circle system utilized Preoxygenation: Pre-oxygenation with 100% oxygen Induction Type: IV induction and Rapid sequence Laryngoscope Size: Mac and 3 Grade View: Grade I Tube type: Oral Tube size: 7.0 mm Number of attempts: 1 Airway Equipment and Method: Stylet and Oral airway Placement Confirmation: ETT inserted through vocal cords under direct vision,  positive ETCO2 and breath sounds checked- equal and bilateral Secured at: 23 cm Tube secured with: Tape Dental Injury: Teeth and Oropharynx as per pre-operative assessment

## 2018-12-11 NOTE — Progress Notes (Addendum)
[facility] Progress Note   TTS Teller 4h   400800   58.5kg  2/2.5 bath  TDC + L AVG placed 2/17 - hect 4  - mircera 100 every 2 wks, last 3/21    Assessment/Plan: 1. Acute hypoxic respiratory failure/ pulm edema / fevers - afebrile   COVID-19 negative. CT showed diffuse infiltrates initially, now CXR clearing nicely which argues for volume overload as initial issue.  2. ESRD- TTS HD. 3-4 kg under prior dry wt, losing body wt. Post Wt Tuesday 55.1 - for HD today - establish EDW as post wt today 3. Anemia of CKD- hgb 8- 9 here. Follow and transfuse prn. Aranesp 100 given 3/31 istat hgb 11.2 - false high 4. Clotted left upper AVGG  placed 2/17-s/p thrombectomy and revision  - strong post op bruit 5. Substance abuse- + cocaine 6. 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 No longer on immunosuppressants.  7. Diarrhea -/enteric precautions/ Cdiff + on oral VAnc 8. Nutrition - alb 2 added prostat- / supplements ordered 9.  MBD - P ok continue hectorol Ca borderline high decrease hectorol to 3 - need to change to 2 K 2 Ca bath at d/c 10. Hx dialysis and medical noncompliance - poor coping skills/marginal support  Myriam Jacobson, PA-C [place] 12/11/2018,2:41 PM  LOS: 8 days   Pt seen, examined and agree w A/P as above. AVG declotted this am, will stick for remainder of HD session today and stick only AVG from here forward, have d/w patient.  If stuck for 1 week we can take cath out.  [facility] 12/11/2018, 4:54 PM     Subjective:   On HD c/o of being hungry then falls back off to sleep  Objective Vitals:   12/11/18 1335 12/11/18 1341 12/11/18 1400 12/11/18 1430  BP: (!) 154/90 (!) 156/92 (!) 143/85 (!) 142/94  Pulse: 92 90 99 (!) 103  Resp: (!) 27     Temp: 98.2 F (36.8 C)     TempSrc: Oral     SpO2: 100%     Weight: 56.8 kg     Height:       Physical Exam General NAD   Heart: tachy reg  Lungs: grossly  Sonorous when sleeping Abdomen: soft NT Extremities: no edema Dialysis Access: left AVGG + bruit - right IJ [facility]   Additional Objective Labs: Basic Metabolic Panel: Recent Labs  Lab 12/06/18 0244 12/07/18 0457 12/08/18 0339 12/09/18 0500 12/11/18 0841  NA 135 136 134* 134* 135  K 3.3* 3.6 3.5 3.9 3.9  CL 100 101 98 99  --   CO2 25 24 23  21*  --   GLUCOSE 92 89 94 93 93  BUN 27* 42* 13 24*  --   CREATININE 5.19* 7.80* 4.30* 6.78*  --   CALCIUM 8.1* 8.5* 8.5* 8.9  --   PHOS 4.2  --   --   --   --    Liver Function Tests: Recent Labs  Lab 12/05/18 0422  AST 24  ALT 15  ALKPHOS 60  BILITOT 0.8  PROT 6.5  ALBUMIN 2.0*   Recent Labs  Lab 12/05/18 0422  LIPASE 29   CBC: Recent Labs  Lab 12/05/18 0422 12/06/18 0244 12/07/18 0457 12/08/18 0339 12/09/18 0500 12/11/18 0841  WBC 7.7 8.3 9.3 8.2 8.4  --   HGB 8.2* 8.1* 9.0* 8.8* 8.9* 11.2*  HCT 27.3* 25.3* 28.9* 28.6*  29.3* 33.0*  MCV 80.3 78.8* 79.8* 80.1 79.2*  --   PLT 189 187 245 245 276  --    Blood Culture    Component Value Date/Time   SDES BLOOD RIGHT WRIST 12/03/2018 1213   SPECREQUEST  12/03/2018 1213    BOTTLES DRAWN AEROBIC AND ANAEROBIC Blood Culture results may not be optimal due to an inadequate volume of blood received in culture bottles   CULT  12/03/2018 1213    NO GROWTH 5 DAYS Performed at [facility], [place] [address], [place],  [address]    REPTSTATUS 12/08/2018 FINAL 12/03/2018 1213    Cardiac Enzymes: No results for input(s): CKTOTAL, CKMB, CKMBINDEX, TROPONINI in the last 168 hours. CBG: Recent Labs  Lab 12/10/18 1109 12/10/18 1724 12/10/18 2103 12/11/18 0739 12/11/18 1207  GLUCAP 103* 123* 108* 92 91   Iron Studies: No results for input(s): IRON, TIBC, TRANSFERRIN, FERRITIN in the last 72 hours. Lab Results  Component Value Date   INR 0.94 10/27/2018   INR 0.96 09/29/2018   INR 0.97 07/23/2018   Studies/Results: No  results found. Medications: . sodium chloride    . sodium chloride    . sodium chloride 10 mL/hr at 12/11/18 0838   . amLODipine  5 mg Oral Daily  . Chlorhexidine Gluconate Cloth  6 each Topical Q0600  . Chlorhexidine Gluconate Cloth  6 each Topical Q0600  . darbepoetin (ARANESP) injection - DIALYSIS  100 mcg Intravenous Q Tue-HD  . doxercalciferol  3 mcg Intravenous Q M,W,F-HD  . famotidine  10 mg Oral Daily  . feeding supplement  1 Container Oral Q24H  . feeding supplement (NEPRO CARB STEADY)  237 mL Oral BID BM  . feeding supplement (PRO-STAT SUGAR FREE 64)  30 mL Oral BID  . fluticasone  2 spray Each Nare Daily  . heparin  5,000 Units Subcutaneous Q8H  . insulin aspart  0-5 Units Subcutaneous QHS  . insulin aspart  0-9 Units Subcutaneous TID WC  . mouth rinse  15 mL Mouth Rinse BID  . multivitamin  1 tablet Oral QHS  . sevelamer carbonate  1,600 mg Oral TID WC  . vancomycin  125 mg Oral QID

## 2018-12-11 NOTE — Anesthesia Postprocedure Evaluation (Signed)
Anesthesia Post Note  Patient: Rhonda Lynch  Procedure(s) Performed: [name] AND  REVISION  ARTERIOVENTOUS [name]  GRAFT LEFT ARM (Left Arm Upper)     Patient location during evaluation: PACU Anesthesia Type: General Level of consciousness: awake Pain management: pain level controlled Vital Signs Assessment: post-procedure vital signs reviewed and stable Respiratory status: spontaneous breathing Cardiovascular status: stable Postop Assessment: no apparent nausea or vomiting Anesthetic complications: no    Last Vitals:  Vitals:   12/11/18 1809 12/11/18 1837  BP:  (!) 134/105  Pulse:  100  Resp:  (!) 22  Temp: 36.6 C 36.6 C  SpO2:  95%    Last Pain:  Vitals:   12/11/18 1837  TempSrc: Oral  PainSc:                  Romero Letizia

## 2018-12-12 ENCOUNTER — Encounter (HOSPITAL_COMMUNITY): Payer: Self-pay | Admitting: Vascular Surgery

## 2018-12-12 DIAGNOSIS — A419 Sepsis, unspecified organism: Secondary | ICD-10-CM

## 2018-12-12 DIAGNOSIS — T8249XD Other complication of vascular dialysis catheter, subsequent encounter: Secondary | ICD-10-CM

## 2018-12-12 LAB — GLUCOSE, CAPILLARY
Glucose-Capillary: 115 mg/dL — ABNORMAL HIGH (ref 70–99)
Glucose-Capillary: 161 mg/dL — ABNORMAL HIGH (ref 70–99)

## 2018-12-12 LAB — HEPATITIS B SURFACE ANTIGEN: Hepatitis B Surface Ag: NEGATIVE

## 2018-12-12 LAB — HEPATITIS B CORE ANTIBODY, TOTAL: Hep B Core Total Ab: NEGATIVE

## 2018-12-12 LAB — HEPATITIS B SURFACE ANTIBODY,QUALITATIVE: Hep B S Ab: NONREACTIVE

## 2018-12-12 MED ORDER — VANCOMYCIN HCL 125 MG PO CAPS: 125.0000 mg | ORAL_CAPSULE | Freq: Four times a day (QID) | ORAL | 0 refills | Status: DC

## 2018-12-12 MED ORDER — VANCOMYCIN 50 MG/ML ORAL SOLUTION: 125.0000 mg | Freq: Four times a day (QID) | ORAL | 0 refills | Status: DC

## 2018-12-12 MED FILL — VANCOMYCIN HCL 125 MG CAP: 125 | 6 days supply | Qty: 23 | Fill #0

## 2018-12-12 NOTE — Progress Notes (Addendum)
  Postoperative hemodialysis access  VASCULAR SURGERY ASSESSMENT & PLAN:   END-STAGE RENAL DISEASE: Patient is undergone successful thrombectomy and revision of her left upper arm graft.  HER GRAFT CAN BE CANNULATED BETWEEN THE 2 INCISIONS.  I would leave her catheter until her graft has worked successfully for several treatments.  The patient can be discharged from our standpoint.  We will be available as needed.  Rhonda Mayo, MD, FACS Beeper [phone] Office: [phone]    Date of Surgery:  12/11/2018 Surgeon: Early  Subjective:  C/o pain in the axilla; denies any pain or numbness in the hand  PHYSICAL EXAMINATION:  Vitals:   12/11/18 2129 12/12/18 0544  BP: 114/79 125/69  Pulse: 96 (!) 103  Resp: (!) 21 20  Temp: 98.2 F (36.8 C) 98.4 F (36.9 C)  SpO2: 98% 100%    Incisions are clean and dry;  radial pulse is not palpable; hand grip is strong Sensation in digits is intact;  There is  Thrill  The graft is palpable    ASSESSMENT/PLAN:  Rhonda Lynch is a 63 y.o. year old female who is s/p T&R of LUA AVG with exploration of venous anastomosis and revision of arterial anastomosis.  -graft/fistula is patent -pt does not have evidence of steal sx -pt has tramadol ordered for pain -do not stick over distal incision, otherwise, may stick anywhere on the graft. -will sign off-call as needed.  F/u with [facility] prn    Leontine Locket, PA-C [facility] [phone]

## 2018-12-12 NOTE — Progress Notes (Signed)
Rhonda Lynch to be D/C'd Home per MD order.  Discussed prescriptions and follow up appointments with the patient. Prescriptions given to patient, medication list explained in detail. Pt verbalized understanding.  Allergies as of 12/12/2018      Reactions   Acetaminophen Nausea And Vomiting   Patient states Acetaminophen and Acetaminophen containing products make her nauseated. She demonstrated this 06/21/18 with nausea followed by emesis.   Hydroxyzine Itching      Medication List    TAKE these medications   Accu-Chek Aviva device Use as instructed   accu-chek soft touch lancets Use twice per day.   acetaminophen 500 MG tablet Commonly known as:  TYLENOL Take 1,000 mg by mouth every 6 (six) hours as needed for mild pain.   amLODipine 5 MG tablet Commonly known as:  NORVASC Take 1 tablet (5 mg total) by mouth at bedtime.   calcitRIOL 0.25 MCG capsule Commonly known as:  ROCALTROL Take 1 capsule (0.25 mcg total) by mouth every Monday, Wednesday, and Friday with hemodialysis.   Dialyvite 800-Zinc 15 0.8 MG Tabs Take 1 tablet by mouth daily.   diphenhydrAMINE 2 % cream Commonly known as:  BENADRYL Apply 1 application topically 3 (three) times daily as needed for itching.   fluticasone 50 MCG/ACT nasal spray Commonly known as:  FLONASE Place 2 sprays into both nostrils daily.   glucose blood test strip Commonly known as:  Accu-Chek Aviva Use twice per day.   hydrOXYzine 25 MG tablet Commonly known as:  ATARAX/VISTARIL Take 25 mg by mouth 3 (three) times daily as needed for itching.   insulin aspart 100 UNIT/ML injection Commonly known as:  novoLOG Inject 5 Units into the skin 3 (three) times daily with meals. What changed:  how much to take   insulin glargine 100 UNIT/ML injection Commonly known as:  LANTUS Inject 0.03 mLs (3 Units total) into the skin at bedtime. What changed:  how much to take   INSULIN SYRINGE .3CC/31GX5/16" 31G X 5/16" 0.3 ML Misc Inject 1  each into the skin 4 (four) times daily.   pantoprazole 40 MG tablet Commonly known as:  Protonix Take 1 tablet (40 mg total) by mouth daily.   sevelamer carbonate 800 MG tablet Commonly known as:  RENVELA Take 2 tablets (1,600 mg total) by mouth 3 (three) times daily with meals.   vancomycin 125 MG capsule Commonly known as:  VANCOCIN Take 1 capsule (125 mg total) by mouth 4 (four) times daily.       Vitals:   12/12/18 0544 12/12/18 0940  BP: 125/69 126/78  Pulse: (!) 103 (!) 106  Resp: 20 18  Temp: 98.4 F (36.9 C) 98.5 F (36.9 C)  SpO2: 100% 99%    An After Visit Summary was printed and given to the patient. Patient escorted via [place], and D/C home via private auto.  Orville Govern, RN

## 2018-12-12 NOTE — Discharge Summary (Addendum)
Physician Discharge Summary  Rhonda Lynch WER:[id number] DOB: 02/07/1956 DOA: 12/03/2018  PCP: Clent Demark, PA-C  Admit date: 12/03/2018 Discharge date: 12/12/2018  Admitted From: home Disposition:  home  Recommendations for Outpatient Follow-up:  1. Follow up with PCP in 1-2 weeks 2. Follow-up with vascular surgery as an outpatient 3. 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 Of note, patient recently admitted 3/8 -3/13 with bilateral lung infiltrates thought to be probable pulmonary edema vs pneumonia in the setting of missed dialysis , treated with iHD and antibiotics.  She was cocaine positive.  She was again admitted 3/17 - 3/18 with AMS thought related to overuse/ likely unintentional cocaine overdose. Patient reports having partial iHD on 3/21.  Denies sick contacts, fever, known COVID -19 exposure, travel, or recent drug use.  She was found by EMS hypoxic with sats in the 80's.  In ER, she remained hypertensive, hypoxic, rectal temp 100.5. CXR showed bilateral infiltrates. Flu neg.  Labs noted for K 5.4, glucose 120, BUN 83, sCr 13.17, WBC 14.1, Hgb 10.7, Lactic 2.4, trop I 0.18 and BNP >4500, EKG non acute. She was treated with empiric abx coverage with vancomycin, cefepime, and flagyl.  PCCM called for admit.  CT abd/ pelvis and chest was ordered and pending in ER.   Hospital Course: Principal Problem Acute hypoxic respiratory failure- Binfiltrates /acute pulmonary edema -likely in the setting of cocaine injury(SARS CoV-2 NEGATIVE), resolved, she is on room air this  morning Active problems ESRD - T/Th/SatHD /clotted left AV graft -Vascular surgery consulted, patient was taken to the OR on 4/2 for thrombectomy and revision of left upper arm AV Gore-Tex graft with exploration of venous anastomosis and revision of arterial anastomosis by Dr. Donnetta Hutching.  To follow-up with vascular surgery as an outpatient C.Diffcolitis -Improving, continue oral vancomycin HTN -initial hypertensive urgency insetting of cocaine abuse, non-compliance, and missed HD.  Blood pressures improved DM2-resume home medications Substance abuse -Has been counseled on absolute need to abstain Severe medical non-compliance -Has been counseled on absolute need to comply w/ meds and HD tx N/Vw/ Small amount hematemesis at time of presentation -CT abdomen without any abnormality Chronic Normocytic Anemiaof ESRD -epo and Fe per Nephrology Trichomonas on UA -Finished a course of Flagyl   Discharge Diagnoses:  Active Problems:   Acute respiratory failure with hypoxia [facility])  Discharge Instructions  Allergies as of 12/12/2018      Reactions   Acetaminophen Nausea And Vomiting   Patient states Acetaminophen and Acetaminophen containing products make her nauseated. She demonstrated this 06/21/18 with nausea followed by emesis.   Hydroxyzine Itching      Medication List    TAKE these medications   Accu-Chek Aviva device Use as instructed   accu-chek soft touch lancets Use twice per day.   acetaminophen 500 MG tablet Commonly known as:  TYLENOL Take 1,000 mg by mouth every 6 (six) hours as needed for mild pain.   amLODipine 5 MG tablet Commonly known as:  NORVASC Take 1 tablet (5 mg total) by mouth at bedtime.   calcitRIOL 0.25 MCG capsule Commonly known as:  ROCALTROL Take 1 capsule (0.25 mcg total) by mouth every Monday, Wednesday, and Friday with hemodialysis.   Dialyvite 800-Zinc 15 0.8 MG Tabs Take 1 tablet by mouth daily.   diphenhydrAMINE 2 % cream Commonly known  as:  BENADRYL Apply 1 application topically 3 (three) times daily as needed for itching.   fluticasone 50 MCG/ACT nasal spray Commonly known as:  FLONASE Place 2 sprays into both nostrils daily.   glucose blood test strip Commonly known as:  Accu-Chek Aviva Use twice per day.   hydrOXYzine 25 MG tablet Commonly known as:  ATARAX/VISTARIL Take 25 mg by mouth 3 (three) times daily as needed for itching.   insulin aspart 100 UNIT/ML injection Commonly known as:  novoLOG Inject 5 Units into the skin 3 (three) times daily with meals. What changed:  how much to take   insulin glargine 100 UNIT/ML injection Commonly known as:  LANTUS Inject 0.03 mLs (3 Units total) into the skin at bedtime. What changed:  how much to take   INSULIN SYRINGE .3CC/31GX5/16" 31G X 5/16" 0.3 ML Misc Inject 1 each into the skin 4 (four) times daily.   pantoprazole 40 MG tablet Commonly known as:  Protonix Take 1 tablet (40 mg total) by mouth daily.   sevelamer carbonate 800 MG tablet Commonly known as:  RENVELA Take 2 tablets (1,600 mg total) by mouth 3 (three) times daily with meals.   vancomycin 125 MG capsule Commonly known as:  VANCOCIN Take 1 capsule (125 mg total) by mouth 4 (four) times daily.      Follow-up Information    [facility].   Specialty:  Vascular Surgery Why:  As needed Contact information: [address] [place] [phone]          Consultations:  Nephrology   Vascular surgery   Procedures/Studies: Thrombectomy and revision of left upper arm AV Gore-Tex graft with exploration of venous anastomosis and revision of arterial anastomosis 4/3  Dg Chest 2 View  Result Date: 11/20/2018 CLINICAL DATA:  Acute respiratory failure. EXAM: CHEST - 2 VIEW COMPARISON:  11/18/2018 FINDINGS: Cardiomegaly and bilateral interstitial opacities are again noted. A RIGHT IJ central venous catheter is noted with tip overlying  the LOWER SVC. No pleural effusion or pneumothorax. No acute bony abnormalities. IMPRESSION: Unchanged appearance of the chest with cardiomegaly and bilateral interstitial opacities. Electronically Signed   By: Margarette Canada M.D.   On: 11/20/2018 14:28   Ct Chest W Contrast  Result Date: 12/03/2018 CLINICAL DATA:  Acute generalized abdominal pain. Shortness of breath. EXAM: CT CHEST, ABDOMEN, AND PELVIS WITH CONTRAST TECHNIQUE: Multidetector CT imaging of the chest, abdomen and pelvis was performed following the standard protocol during bolus administration of intravenous contrast. CONTRAST:  158mL OMNIPAQUE IOHEXOL 300 MG/ML  SOLN COMPARISON:  CT scan of October 23, 2018. FINDINGS: CT CHEST FINDINGS Cardiovascular: There is no evidence of thoracic aortic dissection or aneurysm. Mild cardiomegaly is noted. No pericardial effusion is noted. Right internal jugular dialysis catheter is noted. Mediastinum/Nodes: No enlarged mediastinal, hilar, or axillary lymph nodes. Thyroid gland, trachea, and esophagus demonstrate no significant findings. Lungs/Pleura: Bilateral diffuse reticular and possibly airspace opacities are noted throughout both lungs. Also noted is fluid in both major fissures, right greater than left. Minimal left basilar effusion is noted as well. Given the history of end-stage renal disease and cardiomegaly, these findings are most consistent with pulmonary edema, although superimposed inflammation or infection may be present. Musculoskeletal: No chest wall mass  or suspicious bone lesions identified. CT ABDOMEN PELVIS FINDINGS Hepatobiliary: No focal liver abnormality is seen. Status post cholecystectomy. No biliary dilatation. Pancreas: Unremarkable. No pancreatic ductal dilatation or surrounding inflammatory changes. Spleen: Stable focal low density is noted within the spleen consistent with cyst or hemangioma. Adrenals/Urinary Tract: Adrenal glands are unremarkable. Kidneys are normal, without renal  calculi, focal lesion, or hydronephrosis. Bladder is unremarkable. Stomach/Bowel: Stomach is within normal limits. Appendix appears normal. No evidence of bowel wall thickening, distention, or inflammatory changes. Vascular/Lymphatic: Aortic atherosclerosis. No enlarged abdominal or pelvic lymph nodes. Reproductive: Status post hysterectomy. Stable appearance of fatty lesion in left ovary concerning for dermoid. Right adnexal region is unremarkable. Other: No abdominal wall hernia or abnormality. No abdominopelvic ascites. Musculoskeletal: No acute or significant osseous findings. IMPRESSION: Bilateral diffuse interstitial and possibly airspace opacities are noted throughout both lungs, with mild amount of fluid in both major fissures and mild left basilar pleural effusion. These findings are most consistent with pulmonary edema given the history of end-stage renal disease and dialysis, although pneumonia or combination thereof can not be excluded. Stable chronic findings are noted in the abdomen and pelvis as described above. Aortic Atherosclerosis (ICD10-I70.0). Electronically Signed   By: Marijo Conception, M.D.   On: 12/03/2018 16:28   Ct Chest W Contrast  Result Date: 11/19/2018 CLINICAL DATA:  Hemoptysis x 2-3 days EXAM: CT CHEST WITH CONTRAST TECHNIQUE: Multidetector CT imaging of the chest was performed during intravenous contrast administration. CONTRAST:  4mL OMNIPAQUE IOHEXOL 300 MG/ML  SOLN COMPARISON:  Chest radiographs dated 11/18/2018 FINDINGS: Cardiovascular: Heart is normal in size.  No pericardial effusion. No evidence thoracic aortic aneurysm. Mild atherosclerotic calcifications of the aortic arch. Right dual lumen dialysis catheter terminating at the cavoatrial junction. Mediastinum/Nodes: Small mediastinal lymph nodes which do not meet pathologic CT size criteria. Visualized thyroid is unremarkable. Lungs/Pleura: Extensive central ground-glass with coarse interstitial markings but no true  interlobular septal thickening or pleural effusions. Relative subpleural sparing. No craniocaudal gradient. This appearance may reflect pulmonary edema, although given lack of associated findings, atypical pneumonia such as mycoplasma or viral pneumonia is also possible. Of note, this does not have the characteristic peripheral distribution to suggest COVID-19. Mild subpleural patchy opacity in the lateral right lung base (series 5/image 83). No pneumothorax. Upper Abdomen: Visualized upper abdomen is grossly unremarkable. Musculoskeletal: Mild degenerative changes of the visualized thoracolumbar spine. IMPRESSION: Extensive ground-glass opacity in the lungs bilaterally, possibly reflecting pulmonary edema, although atypical/viral pneumonia is also possible given the lack of associated findings. Aortic Atherosclerosis (ICD10-I70.0). Electronically Signed   By: Julian Hy M.D.   On: 11/19/2018 14:11   Ct Abdomen Pelvis W Contrast  Result Date: 12/03/2018 CLINICAL DATA:  Acute generalized abdominal pain. Shortness of breath. EXAM: CT CHEST, ABDOMEN, AND PELVIS WITH CONTRAST TECHNIQUE: Multidetector CT imaging of the chest, abdomen and pelvis was performed following the standard protocol during bolus administration of intravenous contrast. CONTRAST:  128mL OMNIPAQUE IOHEXOL 300 MG/ML  SOLN COMPARISON:  CT scan of October 23, 2018. FINDINGS: CT CHEST FINDINGS Cardiovascular: There is no evidence of thoracic aortic dissection or aneurysm. Mild cardiomegaly is noted. No pericardial effusion is noted. Right internal jugular dialysis catheter is noted. Mediastinum/Nodes: No enlarged mediastinal, hilar, or axillary lymph nodes. Thyroid gland, trachea, and esophagus demonstrate no significant findings. Lungs/Pleura: Bilateral diffuse reticular and possibly airspace opacities are noted throughout both lungs. Also noted is fluid in both major fissures, right greater than left. Minimal left basilar effusion is noted  as well. Given the history of end-stage renal disease and cardiomegaly, these findings are most consistent with pulmonary edema, although superimposed inflammation or infection may be present. Musculoskeletal: No chest wall mass or suspicious bone lesions identified. CT ABDOMEN PELVIS FINDINGS Hepatobiliary: No focal liver abnormality is seen. Status post cholecystectomy. No biliary dilatation. Pancreas: Unremarkable. No pancreatic ductal dilatation or surrounding inflammatory changes. Spleen: Stable focal low density is noted within the spleen consistent with cyst or hemangioma. Adrenals/Urinary Tract: Adrenal glands are unremarkable. Kidneys are normal, without renal calculi, focal lesion, or hydronephrosis. Bladder is unremarkable. Stomach/Bowel: Stomach is within normal limits. Appendix appears normal. No evidence of bowel wall thickening, distention, or inflammatory changes. Vascular/Lymphatic: Aortic atherosclerosis. No enlarged abdominal or pelvic lymph nodes. Reproductive: Status post hysterectomy. Stable appearance of fatty lesion in left ovary concerning for dermoid. Right adnexal region is unremarkable. Other: No abdominal wall hernia or abnormality. No abdominopelvic ascites. Musculoskeletal: No acute or significant osseous findings. IMPRESSION: Bilateral diffuse interstitial and possibly airspace opacities are noted throughout both lungs, with mild amount of fluid in both major fissures and mild left basilar pleural effusion. These findings are most consistent with pulmonary edema given the history of end-stage renal disease and dialysis, although pneumonia or combination thereof can not be excluded. Stable chronic findings are noted in the abdomen and pelvis as described above. Aortic Atherosclerosis (ICD10-I70.0). Electronically Signed   By: Marijo Conception, M.D.   On: 12/03/2018 16:28   Dg Chest Port 1 View  Result Date: 12/09/2018 CLINICAL DATA:  Pulmonary edema EXAM: PORTABLE CHEST 1 VIEW  COMPARISON:  12/08/2010 FINDINGS: Cardiac shadow remains enlarged. Aortic calcifications are again seen. Dialysis catheter is again noted. Lungs are well aerated bilaterally with diffuse interstitial changes stable from the prior exam. Some slight improved aeration in the right base is noted. No new focal abnormality is noted. IMPRESSION: Minimal improvement in the right base when compared with the prior exam. Electronically Signed   By: Inez Catalina M.D.   On: 12/09/2018 07:55   Dg Chest Port 1 View  Result Date: 12/08/2018 CLINICAL DATA:  Respiratory failure EXAM: PORTABLE CHEST 1 VIEW COMPARISON:  12/07/2018 FINDINGS: Cardiac shadow remains enlarged. Dialysis catheter remains in place. Aortic calcifications are noted. Lungs are well aerated bilaterally with diffuse interstitial changes similar to that seen on the prior exam. Stable right basilar opacity is noted. No sizable effusion is seen. No bony abnormality is noted. IMPRESSION: Stable interstitial changes and right basilar opacity when compared with the prior exam. No new focal abnormality is noted. Electronically Signed   By: Inez Catalina M.D.   On: 12/08/2018 07:45   Dg Chest Port 1 View  Result Date: 12/07/2018 CLINICAL DATA:  Acute respiratory failure EXAM: PORTABLE CHEST 1 VIEW COMPARISON:  12/06/2018 FINDINGS: Cardiomegaly with mild interstitial edema. Mild patchy right basilar opacity, atelectasis versus pneumonia, grossly unchanged. No definite pleural effusions. No pneumothorax. Right IJ dual lumen dialysis catheter terminates at the cavoatrial junction. Interval extubation and removal of enteric tube. IMPRESSION: Interval extubation and removal of enteric tube. Cardiomegaly with mild interstitial edema. No definite pleural effusions. Mild patchy right basilar opacity, atelectasis versus pneumonia, unchanged. Electronically Signed   By: Julian Hy M.D.   On: 12/07/2018 05:36   Dg Chest Port 1 View  Result Date: 12/06/2018 CLINICAL  DATA:  Acute respiratory failure with hypoxia EXAM: PORTABLE CHEST 1 VIEW COMPARISON:  12/05/2018 FINDINGS: Endotracheal tube terminates 3 cm above the carina. Multifocal patchy right lung opacities, suspicious for  pneumonia. Right basilar opacity is mildly improved. Faint interstitial/airspace opacities in the left upper lobe, unchanged. No definite pleural effusions. No pneumothorax. Cardiomegaly. Enteric tube courses into the mid gastric body. Right IJ dual lumen dialysis catheter terminates at the cavoatrial junction. IMPRESSION: Endotracheal tube terminates 3 cm above the carina. Additional support apparatus as above. Multifocal right lung opacities, lower lobe predominant, suspicious for pneumonia. Superimposed interstitial/airspace opacities may reflect mild interstitial edema. No definite pleural effusions. Electronically Signed   By: Julian Hy M.D.   On: 12/06/2018 07:35   Dg Chest Port 1 View  Result Date: 12/05/2018 CLINICAL DATA:  Hypoxia EXAM: PORTABLE CHEST 1 VIEW COMPARISON:  December 04, 2018 FINDINGS: Endotracheal tube tip is 2.1 cm above the carina. Nasogastric tube tip and side port are below the diaphragm. Side port is seen in the stomach. Central catheter tip is in the superior vena cava. No pneumothorax. There is interstitial and patchy airspace opacity throughout the lungs, more severe on the right than on the left. There has been partial clearing of airspace opacity on the right and left sides compared to 1 day prior. No new opacity evident. Heart remains enlarged with mild pulmonary venous hypertension. No adenopathy. There is aortic atherosclerosis. No bone lesions. IMPRESSION: Tube and catheter positions as described without pneumothorax. Areas of interstitial and airspace opacity bilaterally, more severe on the right than on the left, with mild partial clearing bilaterally. The appearance raises question of a degree of ARDS. No new opacity evident. Underlying pulmonary vascular  congestion noted. Aortic Atherosclerosis (ICD10-I70.0). Electronically Signed   By: Lowella Grip III M.D.   On: 12/05/2018 07:20   Dg Chest Port 1 View  Result Date: 12/04/2018 CLINICAL DATA:  Respiratory distress EXAM: PORTABLE CHEST 1 VIEW COMPARISON:  12/03/2018 FINDINGS: Endotracheal tube in good position unchanged. Dual lumen central venous catheter tip at the cavoatrial junction unchanged. NG tube in the stomach. Severe diffuse bilateral airspace disease right greater than left is unchanged. Small bilateral effusions. IMPRESSION: Diffuse bilateral airspace disease right greater than left unchanged. Electronically Signed   By: Franchot Gallo M.D.   On: 12/04/2018 08:17   Dg Chest Port 1 View  Result Date: 12/03/2018 CLINICAL DATA:  63 year old female with possible COVID 19, intubated. EXAM: PORTABLE CHEST 1 VIEW COMPARISON:  Chest CT earlier today. FINDINGS: Portable AP semi upright view at 2211 hours. Endotracheal tube tip in good position between the level the clavicles and carina. Stable right IJ dual lumen catheter. Diffuse bilateral granular pulmonary opacity persists, greater on the right, and not significantly changed since 12/20 5 hours today. Stable cardiomegaly and mediastinal contours. Paucity of bowel gas in the upper abdomen IMPRESSION: 1. Endotracheal tube tip in good position. 2. No significant change in diffuse bilateral granular pulmonary opacity. Electronically Signed   By: Genevie Ann M.D.   On: 12/03/2018 23:08   Dg Chest Port 1 View  Result Date: 12/03/2018 CLINICAL DATA:  Shortness of breath. EXAM: PORTABLE CHEST 1 VIEW COMPARISON:  Radiograph of November 25, 2018. FINDINGS: Stable cardiomegaly. Right internal jugular dialysis catheter is noted. No pneumothorax is noted. Increased bilateral diffuse lung opacities are noted concerning for pneumonia or possibly edema, right greater than left. Small right pleural effusion may be present. Bony thorax is unremarkable. IMPRESSION:  Significantly increased bilateral lung opacities are noted, right greater than left, concerning for pneumonia or possibly edema. Small right pleural effusion is noted. Electronically Signed   By: Marijo Conception, M.D.   On: 12/03/2018  12:58   Dg Chest Port 1 View  Result Date: 11/25/2018 CLINICAL DATA:  Overdose EXAM: PORTABLE CHEST 1 VIEW COMPARISON:  Five days ago FINDINGS: Chronic cardiomegaly with size accentuated by technique. Dialysis catheter on the right with tips at the upper cavoatrial junction. Improved aeration since prior. There is interstitial coarsening that is likely at patient's baseline. Mild density at the right base likely reflecting scar based on previous imaging. IMPRESSION: Baseline appearance of the chest. No acute finding when compared to priors. Electronically Signed   By: Monte Fantasia M.D.   On: 11/25/2018 04:57   Dg Chest Port 1 View  Result Date: 11/16/2018 CLINICAL DATA:  Nausea and vomiting with cough EXAM: PORTABLE CHEST 1 VIEW COMPARISON:  10/24/2018 FINDINGS: Cardiac shadow is enlarged. Aortic calcifications are again seen. Dialysis catheter is again noted on the right. Diffuse alveolar opacities are identified consistent with acute pulmonary edema no focal effusion is seen. Slight increased density is noted in the right base which may represent a superimposed infiltrate. IMPRESSION: Changes consistent with pulmonary edema bilaterally with likely superimposed infiltrate in the right base. Electronically Signed   By: Inez Catalina M.D.   On: 11/16/2018 05:01   Dg Chest Port 1v Same Day  Result Date: 11/18/2018 CLINICAL DATA:  Shortness of breath. EXAM: PORTABLE CHEST 1 VIEW COMPARISON:  Radiographs 11/2018 and 10/24/2018. Abdominal CT 10/23/2018. FINDINGS: 1403 hours. The right IJ hemodialysis catheter is unchanged, tip at the superior cavoatrial junction. There is stable cardiomegaly and aortic atherosclerosis. The bilateral airspace opacities have improved, most  consistent with resolving edema. Residual asymmetric opacity at the right costophrenic angle appears chronic, likely scarring. No significant pleural effusion. IMPRESSION: Improving pulmonary edema. Electronically Signed   By: Richardean Sale M.D.   On: 11/18/2018 14:35    Subjective: - no chest pain, shortness of breath, no abdominal pain, nausea or vomiting.   Discharge Exam: BP 126/78 (BP Location: Right Arm)    Pulse (!) 106    Temp 98.5 F (36.9 C) (Oral)    Resp 18    Ht 5\' 2"  (1.575 m)    Wt 54.6 kg    SpO2 99%    BMI 22.02 kg/m   General: Pt is alert, awake, not in acute distress Cardiovascular: RRR, S1/S2 +, no rubs, no gallops Respiratory: CTA bilaterally, no wheezing, no rhonchi Abdominal: Soft, NT, ND, bowel sounds + Extremities: no edema, no cyanosis   The results of significant diagnostics from this hospitalization (including imaging, microbiology, ancillary and laboratory) are listed below for reference.     Microbiology: Recent Results (from the past 240 hour(s))  Blood Culture (routine x 2)     Status: None   Collection Time: 12/03/18 12:08 PM  Result Value Ref Range Status   Specimen Description BLOOD RIGHT HAND  Final   Special Requests   Final    BOTTLES DRAWN AEROBIC AND ANAEROBIC Blood Culture results may not be optimal due to an inadequate volume of blood received in culture bottles   Culture   Final    NO GROWTH 5 DAYS Performed at [facility], [place] [address] [place], [place] [address]    Report Status 12/08/2018 FINAL  Final  Blood Culture (routine x 2)     Status: None   Collection Time: 12/03/18 12:13 PM  Result Value Ref Range Status   Specimen Description BLOOD RIGHT WRIST  Final   Special Requests   Final    BOTTLES DRAWN AEROBIC AND ANAEROBIC Blood  Culture results may not be optimal due to an inadequate volume of blood received in culture bottles   Culture   Final    NO GROWTH 5 DAYS Performed at [facility], [place] [address] [place], [place] [address]    Report Status 12/08/2018 FINAL  Final  Respiratory Panel by PCR     Status: None   Collection Time: 12/03/18  1:13 PM  Result Value Ref Range Status   Adenovirus NOT DETECTED NOT DETECTED Final   Coronavirus 229E NOT DETECTED NOT DETECTED Final    Comment: (NOTE) The Coronavirus on the Respiratory Panel, DOES NOT test for the novel  Coronavirus (2019 nCoV)    Coronavirus HKU1 NOT DETECTED NOT DETECTED Final   Coronavirus NL63 NOT DETECTED NOT DETECTED Final   Coronavirus OC43 NOT DETECTED NOT DETECTED Final   Metapneumovirus NOT DETECTED NOT DETECTED Final   Rhinovirus / Enterovirus NOT DETECTED NOT DETECTED Final   Influenza A NOT DETECTED NOT DETECTED Final   Influenza B NOT DETECTED NOT DETECTED Final   Parainfluenza Virus 1 NOT DETECTED NOT DETECTED Final   Parainfluenza Virus 2 NOT DETECTED NOT DETECTED Final   Parainfluenza Virus 3 NOT DETECTED NOT DETECTED Final   Parainfluenza Virus 4 NOT DETECTED NOT DETECTED Final   Respiratory Syncytial Virus NOT DETECTED NOT DETECTED Final   Bordetella pertussis NOT DETECTED NOT DETECTED Final   Chlamydophila pneumoniae NOT DETECTED NOT DETECTED Final   Mycoplasma pneumoniae NOT DETECTED NOT DETECTED Final    Comment: Performed at [facility], [address], [place], [place] [address]  Novel Coronavirus, NAA (hospital order; send-out to ref lab)     Status: None   Collection Time: 12/03/18 11:13 PM  Result Value Ref Range Status   SARS-CoV-2, NAA NOT DETECTED NOT DETECTED Final    Comment: (NOTE) Testing was performed using the cobas(R) SARS-CoV-2 test. This test was developed and its performance characteristics determined by [employer]. This test has not been FDA cleared or approved. This test has been authorized by FDA under an Emergency Use Authorization (EUA). This test is only authorized for the duration of time the declaration that circumstances exist justifying the authorization  of the emergency use of in vitro diagnostic tests for detection of SARS-CoV-2 virus and/or diagnosis of COVID-19 infection under section 564(b)(1) of the Act, 21 U.S.C. 355DDU-2(G)(2), unless the authorization is terminated or revoked sooner. Performed At: [facility] [address] [place], [place] [id number] Rush Farmer MD SE:[id number]    [place]  Final    Comment: Performed at [facility], [place] [address], [place], [place] [address]  Novel Coronavirus, NAA (hospital order; send-out to ref lab)     Status: None   Collection Time: 12/05/18 12:53 PM  Result Value Ref Range Status   SARS-CoV-2, NAA NOT DETECTED NOT DETECTED Final    Comment: (NOTE) Testing was performed using the cobas(R) SARS-CoV-2 test. This test was developed and its performance characteristics determined by [employer]. This test has not been FDA cleared or approved. This test has been authorized by FDA under an Emergency Use Authorization (EUA). This test is only authorized for the duration of time the declaration that circumstances exist justifying the authorization of the emergency use of in vitro diagnostic tests for detection of SARS-CoV-2 virus and/or diagnosis of COVID-19 infection under section 564(b)(1) of the Act, 21 U.S.C. 626RSW-5(I)(6), unless the authorization is terminated or revoked sooner. Performed At: [facility] [address]  [address] [place], [place] [id number] Rush Farmer MD QB:[id number]    Coronavirus Source NASAL SWAB  Corrected    Comment: Performed at [facility], [place] [address], [place], [place] [address] CORRECTED ON 03/27 AT 1625: PREVIOUSLY REPORTED AS NASOPHARYNGEAL   C difficile quick scan w PCR reflex     Status: Abnormal   Collection Time: 12/07/18 12:49 PM  Result Value Ref Range Status   C Diff antigen POSITIVE (A) NEGATIVE Final   C Diff toxin NEGATIVE NEGATIVE Final   C Diff interpretation Results are  indeterminate. See PCR results.  Final    Comment: Performed at [facility], [place] [address] [place], [place] [address]  C. Diff by PCR, Reflexed     Status: Abnormal   Collection Time: 12/07/18 12:49 PM  Result Value Ref Range Status   Toxigenic C. Difficile by PCR POSITIVE (A) NEGATIVE Final    Comment: Positive for toxigenic C. difficile with little to no toxin production. Only treat if clinical presentation suggests symptomatic illness. Performed at [facility], [place] [address], [place], [place] [address]      Labs: BNP (last 3 results) Recent Labs    02/12/18 1534 06/20/18 2042 12/03/18 1209  BNP 225.7* >4,500.0* >4,196.2*   Basic Metabolic Panel: Recent Labs  Lab 12/06/18 0244 12/07/18 0457 12/08/18 0339 12/09/18 0500 12/11/18 0841 12/11/18 1430  NA 135 136 134* 134* 135 132*  K 3.3* 3.6 3.5 3.9 3.9 3.6  CL 100 101 98 99  --  96*  CO2 25 24 23  21*  --  26  GLUCOSE 92 89 94 93 93 127*  BUN 27* 42* 13 24*  --  5*  CREATININE 5.19* 7.80* 4.30* 6.78*  --  2.83*  CALCIUM 8.1* 8.5* 8.5* 8.9  --  8.2*  MG 1.9 2.1 1.9  --   --   --   PHOS 4.2  --   --   --   --  2.2*   Liver Function Tests: Recent Labs  Lab 12/11/18 1430  ALBUMIN 2.8*   No results for input(s): LIPASE, AMYLASE in the last 168 hours. No results for input(s): AMMONIA in the last 168 hours. CBC: Recent Labs  Lab 12/06/18 0244 12/07/18 0457 12/08/18 0339 12/09/18 0500 12/11/18 0841 12/11/18 1430  WBC 8.3 9.3 8.2 8.4  --  14.4*  HGB 8.1* 9.0* 8.8* 8.9* 11.2* 10.3*  HCT 25.3* 28.9* 28.6* 29.3* 33.0* 34.4*  MCV 78.8* 79.8* 80.1 79.2*  --  78.9*  PLT 187 245 245 276  --  274   Cardiac Enzymes: No results for input(s): CKTOTAL, CKMB, CKMBINDEX, TROPONINI in the last 168 hours. BNP: Invalid input(s): POCBNP CBG: Recent Labs  Lab 12/11/18 0739 12/11/18 1207 12/11/18 1831 12/11/18 2130 12/12/18 0722  GLUCAP 92 91 222* 158* 115*   D-Dimer No results for input(s): DDIMER in  the last 72 hours. Hgb A1c No results for input(s): HGBA1C in the last 72 hours. Lipid Profile No results for input(s): CHOL, HDL, LDLCALC, TRIG, CHOLHDL, LDLDIRECT in the last 72 hours. Thyroid function studies No results for input(s): TSH, T4TOTAL, T3FREE, THYROIDAB in the last 72 hours.  Invalid input(s): FREET3 Anemia work up No results for input(s): VITAMINB12, FOLATE, FERRITIN, TIBC, IRON, RETICCTPCT in the last 72 hours. Urinalysis    Component Value Date/Time   COLORURINE YELLOW 12/03/2018 1208   APPEARANCEUR HAZY (A) 12/03/2018 1208   LABSPEC 1.020 12/03/2018 1208   PHURINE 6.0 12/03/2018 1208  GLUCOSEU 100 (A) 12/03/2018 1208   HGBUR LARGE (A) 12/03/2018 1208   HGBUR trace-intact 03/08/2010 1452   BILIRUBINUR NEGATIVE 12/03/2018 1208   KETONESUR NEGATIVE 12/03/2018 1208   PROTEINUR >300 (A) 12/03/2018 1208   UROBILINOGEN 0.2 03/08/2010 1452   NITRITE NEGATIVE 12/03/2018 1208   LEUKOCYTESUR TRACE (A) 12/03/2018 1208   Sepsis Labs Invalid input(s): PROCALCITONIN,  WBC,  LACTICIDVEN  FURTHER DISCHARGE INSTRUCTIONS:   Get Medicines reviewed and adjusted: Please take all your medications with you for your next visit with your Primary MD   Laboratory/radiological data: Please request your Primary MD to go over all hospital tests and procedure/radiological results at the follow up, please ask your Primary MD to get all Hospital records sent to his/her office.   In some cases, they will be blood work, cultures and biopsy results pending at the time of your discharge. Please request that your primary care M.D. goes through all the records of your hospital data and follows up on these results.   Also Note the following: If you experience worsening of your admission symptoms, develop shortness of breath, life threatening emergency, suicidal or homicidal thoughts you must seek medical attention immediately by calling 911 or calling your MD immediately  if symptoms less  severe.   You must read complete instructions/literature along with all the possible adverse reactions/side effects for all the Medicines you take and that have been prescribed to you. Take any new Medicines after you have completely understood and accpet all the possible adverse reactions/side effects.    Do not drive when taking Pain medications or sleeping medications (Benzodaizepines)   Do not take more than prescribed Pain, Sleep and Anxiety Medications. It is not advisable to combine anxiety,sleep and pain medications without talking with your primary care practitioner   Special Instructions: If you have smoked or chewed Tobacco  in the last 2 yrs please stop smoking, stop any regular Alcohol  and or any Recreational drug use.   Wear Seat belts while driving.   Please note: You were cared for by a hospitalist during your hospital stay. Once you are discharged, your primary care physician will handle any further medical issues. Please note that NO REFILLS for any discharge medications will be authorized once you are discharged, as it is imperative that you return to your primary care physician (or establish a relationship with a primary care physician if you do not have one) for your post hospital discharge needs so that they can reassess your need for medications and monitor your lab values.  Time coordinating discharge: 35 minutes  SIGNED:  Marzetta Board, PA-S 12/12/2018, 10:59 AM

## 2018-12-12 NOTE — Discharge Instructions (Signed)
° °  [facility]  Discharge Instructions  AV Fistula or Graft Surgery for Dialysis Access  Please refer to the following instructions for your post-procedure care. Your surgeon or physician assistant will discuss any changes with you.  Activity  You may drive the day following your surgery, if you are comfortable and no longer taking prescription pain medication. Resume full activity as the soreness in your incision resolves.  Bathing/Showering  You may shower after you go home. Keep your incision dry for 48 hours. Do not soak in a bathtub, hot tub, or swim until the incision heals completely. You may not shower if you have a hemodialysis catheter.  Incision Care  Clean your incision with mild soap and water after 48 hours. Pat the area dry with a clean towel. You do not need a bandage unless otherwise instructed. Do not apply any ointments or creams to your incision. You may have skin glue on your incision. Do not peel it off. It will come off on its own in about one week. Your arm may swell a bit after surgery. To reduce swelling use pillows to elevate your arm so it is above your heart. Your doctor will tell you if you need to lightly wrap your arm with an ACE bandage.  Diet  Resume your normal diet. There are not special food restrictions following this procedure. In order to heal from your surgery, it is CRITICAL to get adequate nutrition. Your body requires vitamins, minerals, and protein. Vegetables are the best source of vitamins and minerals. Vegetables also provide the perfect balance of protein. Processed food has little nutritional value, so try to avoid this.  Medications  Resume taking all of your medications. If your incision is causing pain, you may take over-the counter pain relievers such as acetaminophen (Tylenol). If you were prescribed a stronger pain medication, please be aware these medications can cause nausea and constipation. Prevent  nausea by taking the medication with a snack or meal. Avoid constipation by drinking plenty of fluids and eating foods with high amount of fiber, such as fruits, vegetables, and grains.  Do not take Tylenol if you are taking prescription pain medications.  Follow up Your surgeon may want to see you in the office following your access surgery. If so, this will be arranged at the time of your surgery.  Please call us immediately for any of the following conditions:  Increased pain, redness, drainage (pus) from your incision site Fever of 101 degrees or higher Severe or worsening pain at your incision site Hand pain or numbness.  Reduce your risk of vascular disease:  Stop smoking. If you would like help, call [facility] at 1-800-QUIT-NOW 563 334 1639) or [place] at [place] your cholesterol Maintain a desired weight Control your diabetes Keep your blood pressure down  Dialysis  It will take several weeks to several months for your new dialysis access to be ready for use. Your surgeon will determine when it is okay to use it. Your nephrologist will continue to direct your dialysis. You can continue to use your Permcath until your new access is ready for use.   12/12/2018 Rhonda Lynch [id number] [id number]  Surgeon(s): Early, Arvilla Meres, MD  Procedure(s): THROMBECTOMY AND  REVISION  ARTERIOVENTOUS GORETEX  GRAFT LEFT ARM   May stick graft immediately  with exception of distal incision-do not stick over incision.    If you have any questions, please call the office at [phone].

## 2018-12-18 ENCOUNTER — Inpatient Hospital Stay (HOSPITAL_COMMUNITY)
Admission: EM | Admit: 2018-12-18 | Discharge: 2018-12-22 | DRG: 640 | Disposition: A | Discharge status: Home / Self Care | Payer: Medicare Other | Attending: Internal Medicine | Admitting: Internal Medicine

## 2018-12-18 ENCOUNTER — Emergency Department (HOSPITAL_COMMUNITY): Payer: Medicare Other

## 2018-12-18 ENCOUNTER — Other Ambulatory Visit: Payer: Self-pay

## 2018-12-18 ENCOUNTER — Other Ambulatory Visit: Payer: Self-pay | Admitting: Internal Medicine

## 2018-12-18 ENCOUNTER — Encounter (HOSPITAL_COMMUNITY): Payer: Self-pay

## 2018-12-18 DIAGNOSIS — R0602 Shortness of breath: Secondary | ICD-10-CM

## 2018-12-18 DIAGNOSIS — Z590 Homelessness: Secondary | ICD-10-CM

## 2018-12-18 DIAGNOSIS — N186 End stage renal disease: Secondary | ICD-10-CM

## 2018-12-18 DIAGNOSIS — J8489 Other specified interstitial pulmonary diseases: Secondary | ICD-10-CM | POA: Diagnosis present

## 2018-12-18 DIAGNOSIS — E119 Type 2 diabetes mellitus without complications: Secondary | ICD-10-CM

## 2018-12-18 DIAGNOSIS — E1122 Type 2 diabetes mellitus with diabetic chronic kidney disease: Secondary | ICD-10-CM | POA: Diagnosis present

## 2018-12-18 DIAGNOSIS — F1410 Cocaine abuse, uncomplicated: Secondary | ICD-10-CM | POA: Diagnosis present

## 2018-12-18 DIAGNOSIS — I10 Essential (primary) hypertension: Secondary | ICD-10-CM | POA: Diagnosis present

## 2018-12-18 DIAGNOSIS — B1920 Unspecified viral hepatitis C without hepatic coma: Secondary | ICD-10-CM | POA: Diagnosis present

## 2018-12-18 DIAGNOSIS — Z794 Long term (current) use of insulin: Secondary | ICD-10-CM

## 2018-12-18 DIAGNOSIS — Z86711 Personal history of pulmonary embolism: Secondary | ICD-10-CM

## 2018-12-18 DIAGNOSIS — E875 Hyperkalemia: Secondary | ICD-10-CM | POA: Diagnosis not present

## 2018-12-18 DIAGNOSIS — IMO0001 Reserved for inherently not codable concepts without codable children: Secondary | ICD-10-CM

## 2018-12-18 DIAGNOSIS — I120 Hypertensive chronic kidney disease with stage 5 chronic kidney disease or end stage renal disease: Secondary | ICD-10-CM | POA: Diagnosis present

## 2018-12-18 DIAGNOSIS — F17210 Nicotine dependence, cigarettes, uncomplicated: Secondary | ICD-10-CM | POA: Diagnosis present

## 2018-12-18 DIAGNOSIS — Z9119 Patient's noncompliance with other medical treatment and regimen: Secondary | ICD-10-CM

## 2018-12-18 DIAGNOSIS — Z91199 Patient's noncompliance with other medical treatment and regimen due to unspecified reason: Secondary | ICD-10-CM

## 2018-12-18 DIAGNOSIS — E8770 Fluid overload, unspecified: Secondary | ICD-10-CM | POA: Diagnosis present

## 2018-12-18 DIAGNOSIS — I776 Arteritis, unspecified: Secondary | ICD-10-CM | POA: Diagnosis present

## 2018-12-18 DIAGNOSIS — J9601 Acute respiratory failure with hypoxia: Secondary | ICD-10-CM | POA: Diagnosis present

## 2018-12-18 DIAGNOSIS — E872 Acidosis: Secondary | ICD-10-CM | POA: Diagnosis present

## 2018-12-18 DIAGNOSIS — F1994 Other psychoactive substance use, unspecified with psychoactive substance-induced mood disorder: Secondary | ICD-10-CM | POA: Diagnosis present

## 2018-12-18 DIAGNOSIS — E8729 Other acidosis: Secondary | ICD-10-CM | POA: Diagnosis present

## 2018-12-18 DIAGNOSIS — D631 Anemia in chronic kidney disease: Secondary | ICD-10-CM | POA: Diagnosis present

## 2018-12-18 DIAGNOSIS — Z8673 Personal history of transient ischemic attack (TIA), and cerebral infarction without residual deficits: Secondary | ICD-10-CM

## 2018-12-18 DIAGNOSIS — Z9115 Patient's noncompliance with renal dialysis: Secondary | ICD-10-CM

## 2018-12-18 DIAGNOSIS — Z9114 Patient's other noncompliance with medication regimen: Secondary | ICD-10-CM

## 2018-12-18 DIAGNOSIS — F1414 Cocaine abuse with cocaine-induced mood disorder: Secondary | ICD-10-CM | POA: Diagnosis present

## 2018-12-18 DIAGNOSIS — N2581 Secondary hyperparathyroidism of renal origin: Secondary | ICD-10-CM | POA: Diagnosis present

## 2018-12-18 DIAGNOSIS — R197 Diarrhea, unspecified: Secondary | ICD-10-CM

## 2018-12-18 DIAGNOSIS — R112 Nausea with vomiting, unspecified: Secondary | ICD-10-CM

## 2018-12-18 DIAGNOSIS — R1013 Epigastric pain: Secondary | ICD-10-CM | POA: Diagnosis present

## 2018-12-18 DIAGNOSIS — Z992 Dependence on renal dialysis: Secondary | ICD-10-CM

## 2018-12-18 DIAGNOSIS — K562 Volvulus: Secondary | ICD-10-CM | POA: Diagnosis present

## 2018-12-18 DIAGNOSIS — Z888 Allergy status to other drugs, medicaments and biological substances status: Secondary | ICD-10-CM

## 2018-12-18 DIAGNOSIS — Z79899 Other long term (current) drug therapy: Secondary | ICD-10-CM

## 2018-12-18 DIAGNOSIS — E11649 Type 2 diabetes mellitus with hypoglycemia without coma: Secondary | ICD-10-CM | POA: Diagnosis not present

## 2018-12-18 DIAGNOSIS — A0472 Enterocolitis due to Clostridium difficile, not specified as recurrent: Secondary | ICD-10-CM | POA: Diagnosis present

## 2018-12-18 LAB — COMPREHENSIVE METABOLIC PANEL
ALT: 15 U/L (ref 0–44)
AST: 26 U/L (ref 15–41)
Albumin: 2.8 g/dL — ABNORMAL LOW (ref 3.5–5.0)
Alkaline Phosphatase: 87 U/L (ref 38–126)
Anion gap: 18 — ABNORMAL HIGH (ref 5–15)
BUN: 78 mg/dL — ABNORMAL HIGH (ref 8–23)
CO2: 17 mmol/L — ABNORMAL LOW (ref 22–32)
Calcium: 7.8 mg/dL — ABNORMAL LOW (ref 8.9–10.3)
Chloride: 102 mmol/L (ref 98–111)
Creatinine, Ser: 15.1 mg/dL — ABNORMAL HIGH (ref 0.44–1.00)
GFR calc Af Amer: 3 mL/min — ABNORMAL LOW (ref >60)
GFR calc non Af Amer: 2 mL/min — ABNORMAL LOW (ref >60)
Glucose, Bld: 104 mg/dL — ABNORMAL HIGH (ref 70–99)
Potassium: 5.6 mmol/L — ABNORMAL HIGH (ref 3.5–5.1)
Sodium: 137 mmol/L (ref 135–145)
Total Bilirubin: 0.6 mg/dL (ref 0.3–1.2)
Total Protein: 7.5 g/dL (ref 6.5–8.1)

## 2018-12-18 LAB — CBC WITH DIFFERENTIAL/PLATELET
Abs Immature Granulocytes: 0.03 10*3/uL (ref 0.00–0.07)
Basophils Absolute: 0 10*3/uL (ref 0.0–0.1)
Basophils Relative: 0 %
Eosinophils Absolute: 0 10*3/uL (ref 0.0–0.5)
Eosinophils Relative: 0 %
HCT: 32.8 % — ABNORMAL LOW (ref 36.0–46.0)
Hemoglobin: 10 g/dL — ABNORMAL LOW (ref 12.0–15.0)
Immature Granulocytes: 0 %
Lymphocytes Relative: 21 %
Lymphs Abs: 2 10*3/uL (ref 0.7–4.0)
MCH: 23.4 pg — ABNORMAL LOW (ref 26.0–34.0)
MCHC: 30.5 g/dL (ref 30.0–36.0)
MCV: 76.8 fL — ABNORMAL LOW (ref 80.0–100.0)
Monocytes Absolute: 1 10*3/uL (ref 0.1–1.0)
Monocytes Relative: 11 %
Neutro Abs: 6.6 10*3/uL (ref 1.7–7.7)
Neutrophils Relative %: 68 %
Platelets: 235 10*3/uL (ref 150–400)
RBC: 4.27 MIL/uL (ref 3.87–5.11)
RDW: 19.9 % — ABNORMAL HIGH (ref 11.5–15.5)
WBC: 9.7 10*3/uL (ref 4.0–10.5)
nRBC: 0 % (ref 0.0–0.2)

## 2018-12-18 LAB — LACTIC ACID, PLASMA: Lactic Acid, Venous: 1 mmol/L (ref 0.5–1.9)

## 2018-12-18 LAB — LIPASE, BLOOD: Lipase: 55 U/L — ABNORMAL HIGH (ref 11–51)

## 2018-12-18 MED ORDER — MORPHINE SULFATE (PF) 4 MG/ML IV SOLN
4.0000 mg | Freq: Once | INTRAVENOUS | Status: AC
  Administered 2018-12-18: 4 mg via INTRAVENOUS
  Filled 2018-12-18: qty 1

## 2018-12-18 MED ORDER — FENTANYL CITRATE (PF) 100 MCG/2ML IJ SOLN
50.0000 ug | Freq: Once | INTRAMUSCULAR | Status: AC
  Administered 2018-12-19: 50 ug via INTRAVENOUS
  Filled 2018-12-18 (×2): qty 2

## 2018-12-18 MED ORDER — PANTOPRAZOLE SODIUM 40 MG PO TBEC
40.0000 mg | DELAYED_RELEASE_TABLET | Freq: Every day | ORAL | Status: DC
  Administered 2018-12-19 – 2018-12-22 (×4): 40 mg via ORAL
  Filled 2018-12-18 (×4): qty 1

## 2018-12-18 MED ORDER — ACETAMINOPHEN 500 MG PO TABS: 1000.0000 mg | ORAL_TABLET | Freq: Four times a day (QID) | ORAL | Status: DC | PRN

## 2018-12-18 MED ORDER — SEVELAMER CARBONATE 800 MG PO TABS
1600.0000 mg | ORAL_TABLET | Freq: Three times a day (TID) | ORAL | Status: DC
  Administered 2018-12-19 (×2): 1600 mg via ORAL
  Filled 2018-12-18 (×2): qty 2

## 2018-12-18 MED ORDER — DIPHENHYDRAMINE HCL 25 MG PO CAPS
25.0000 mg | ORAL_CAPSULE | Freq: Three times a day (TID) | ORAL | Status: DC | PRN
  Administered 2018-12-18 – 2018-12-19 (×2): 25 mg via ORAL
  Filled 2018-12-18: qty 1

## 2018-12-18 MED ORDER — ONDANSETRON HCL 4 MG/2ML IJ SOLN
4.0000 mg | Freq: Four times a day (QID) | INTRAMUSCULAR | Status: DC
  Administered 2018-12-19 – 2018-12-20 (×4): 4 mg via INTRAVENOUS
  Filled 2018-12-18 (×4): qty 2

## 2018-12-18 MED ORDER — ONDANSETRON HCL 4 MG/2ML IJ SOLN
4.0000 mg | Freq: Once | INTRAMUSCULAR | Status: AC
  Administered 2018-12-18: 4 mg via INTRAVENOUS
  Filled 2018-12-18: qty 2

## 2018-12-18 MED ORDER — AMLODIPINE BESYLATE 5 MG PO TABS
5.0000 mg | ORAL_TABLET | Freq: Every day | ORAL | Status: DC
  Administered 2018-12-19 – 2018-12-21 (×4): 5 mg via ORAL
  Filled 2018-12-18 (×4): qty 1

## 2018-12-18 MED ORDER — VANCOMYCIN 50 MG/ML ORAL SOLUTION
125.0000 mg | Freq: Three times a day (TID) | ORAL | Status: DC
  Administered 2018-12-19 – 2018-12-20 (×5): 125 mg via ORAL
  Filled 2018-12-18 (×10): qty 2.5

## 2018-12-18 MED ORDER — FLUTICASONE PROPIONATE 50 MCG/ACT NA SUSP
2.0000 | Freq: Every day | NASAL | Status: DC
  Administered 2018-12-21 – 2018-12-22 (×2): 2 via NASAL
  Filled 2018-12-18: qty 16

## 2018-12-18 MED ORDER — INSULIN GLARGINE 100 UNIT/ML ~~LOC~~ SOLN
3.0000 [IU] | Freq: Every day | SUBCUTANEOUS | Status: DC
  Administered 2018-12-19 – 2018-12-21 (×4): 3 [IU] via SUBCUTANEOUS
  Filled 2018-12-18 (×5): qty 0.03

## 2018-12-18 MED ORDER — INSULIN ASPART 100 UNIT/ML ~~LOC~~ SOLN
5.0000 [IU] | Freq: Three times a day (TID) | SUBCUTANEOUS | Status: DC
  Administered 2018-12-19: 5 [IU] via SUBCUTANEOUS

## 2018-12-18 NOTE — ED Notes (Addendum)
Pt if she could ambulate around the nurses station. Pt states she can't ambulate at this moment.

## 2018-12-18 NOTE — ED Triage Notes (Signed)
Pt reports 1 episode of vomiting and abdominal pain. She has had 2-3 episodes of diarrhea.

## 2018-12-18 NOTE — Progress Notes (Signed)
PIV consult: Site obtained R hand. Pt reports she has missed 2 dialysis treatments.  R chest HD cath dressing dated 12/09/18, intact. Discussed with Benjamine Mola, RN: Please enter consult for dressing change if pt does not go to dialysis here today.  Better to change dressing there than in ED. Should not be discharged with old dressing.

## 2018-12-18 NOTE — ED Notes (Signed)
Patient transported to X-ray 

## 2018-12-18 NOTE — ED Notes (Signed)
Pt made aware of the need for urine.

## 2018-12-18 NOTE — ED Provider Notes (Signed)
[facility] EMERGENCY DEPARTMENT Provider Note   CSN: [id number] Arrival date & time: 12/18/18  1755    History   Chief Complaint Chief Complaint  Patient presents with  . Abdominal Pain    HPI Rhonda Lynch is a 63 y.o. female with history of ESRD on dialysis Tuesday Thursday Saturday, diabetes mellitus, hypertension, otitis C, CVA, polysubstance abuse, medication and dialysis noncompliance presents for evaluation of acute onset, persistent generalized abdominal pain beginning today.  She notes associated watery nonbloody diarrhea and has had multiple episodes of nonbloody nonbilious emesis today.  No aggravating or alleviating factors noted.  She reports that she was unable to get dialysis today because "someone took my pocketbook and I got upset ".  She last had a full dialysis treatment on Saturday.  She also notes some shortness of breath and cough which she reports are chronic.  Has not tried anything for her symptoms.  Recently seen and admitted for hypoxia with abdominal pain nausea vomiting and diarrhea.  Discharged on 12/12/2018.  Discharge summary notes that she has a history of C. difficile colitis which has been improving and should be on oral vancomycin.  Also of note, her left AV graft was found to be clotted and required surgical exploration of the venous anastomosis and revision on 12/11/2018.      The history is provided by the patient.    Past Medical History:  Diagnosis Date  . Diabetes mellitus without complication ([place])   . ESRD (end stage renal disease) on dialysis ([place])    "TTS; [facility]; they're moving me to another one" (09/22/2018)  . Hepatitis C    diagnosed 09/2018  . Hypertension   . Oxygen deficiency    2 L at night  . Stroke ([place])   . Substance abuse ([place])    has been clean for 4 months     Patient Active Problem List   Diagnosis Date Noted  . Hyperkalemia 11/25/2018  . Cocaine overdose, accidental or unintentional, initial  encounter ([place]) 11/25/2018  . CAP (community acquired pneumonia) 11/17/2018  . End-stage renal disease on hemodialysis ([place])   . Hypoglycemia 10/23/2018  . Non-intractable vomiting   . Other ascites   . Palliative care encounter   . Left arm pain   . Chronic hepatitis C virus genotype 1b infection ([place]) 09/28/2018  . ICH (intracerebral hemorrhage) ([place]) small left caudate due to HTN 07/17/2018  . HCAP (healthcare-associated pneumonia) 06/21/2018  . Substance induced mood disorder ([place]) 05/15/2018  . Agitation   . Hemodialysis catheter infection, initial encounter ([place])   . 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 Pulmonary embolism ([place]) 04/27/2018  . Sepsis ([place]) 04/26/2018  . Dyspnea 04/26/2018  . Chest pain 04/26/2018  . Tachycardia 04/26/2018  . Abdominal pain 04/18/2018  . Type 2 diabetes mellitus ([place]) 04/18/2018  . Right flank pain 04/18/2018  . Acute respiratory failure with hypoxia ([place]) 04/12/2018  . SVT (supraventricular tachycardia) ([place])   . Chronic renal failure   . Hypertension   . Non-compliance with renal dialysis ([place])   . Acute pulmonary edema ([facility])   . Elevated troponin 03/19/2018  . Fever   . 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 Diffuse pulmonary alveolar hemorrhage   . Hypoxemia   . Pulmonary infiltrate   . 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 Acute respiratory failure ([place]) 02/12/2018  . Alcohol abuse 02/12/2018  . Microcytic anemia 02/12/2018  . Cocaine abuse ([place]) 02/12/2018  . Epigastric pain 02/12/2018  .  Pneumonia 02/12/2018  . TOBACCO ABUSE 03/08/2010  . PNEUMONIA 03/08/2010    Past Surgical History:  Procedure Laterality Date  . AV FISTULA PLACEMENT Left 03/05/2018   Procedure: ARTERIOVENOUS (AV) FISTULA CREATION  LEFT UPPER EXTREMITY;  Surgeon: [name], MD;  Location: [facility];  Service: Vascular;  Laterality: Left;  . AV FISTULA PLACEMENT Left 07/21/2018   Procedure: ARTERIOVENOUS (AV) FISTULA CREATION;  Surgeon: Marty Heck, MD;  Location: [facility];  Service: Vascular;  Laterality: Left;  . AV FISTULA PLACEMENT Left 10/27/2018   Procedure: INSERTION OF ARTERIOVENOUS (AV) GORE-TEX GRAFT UPPER ARM;  Surgeon: Marty Heck, MD;  Location: [facility];  Service: Vascular;  Laterality: Left;  . BASCILIC VEIN TRANSPOSITION Left 09/29/2018   Procedure: BASILIC VEIN TRANSPOSITION SECOND STAGE LEFT UPPER ARM;  Surgeon: Marty Heck, MD;  Location: [facility];  Service: Vascular;  Laterality: Left;  . HEMATOMA EVACUATION Left 03/06/2018   Procedure: EVACUATION HEMATOMA LEFT ARM;  Surgeon: Angelia Mould, MD;  Location: [facility];  Service: Vascular;  Laterality: Left;  . I&D EXTREMITY Left 10/06/2018   Procedure: IRRIGATION AND DEBRIDEMENT ARM;  Surgeon: Marty Heck, MD;  Location: [facility];  Service: Vascular;  Laterality: Left;  . INSERTION OF DIALYSIS CATHETER Left 04/29/2018   Procedure: INSERTION OF DIALYSIS CATHETER LEFT INTERNAL JUGULAR;  Surgeon: Angelia Mould, MD;  Location: [facility];  Service: Vascular;  Laterality: Left;  . IR FLUORO GUIDE CV LINE RIGHT  02/18/2018  . IR FLUORO GUIDE CV LINE RIGHT  02/26/2018  . IR REMOVAL TUN CV CATH W/O FL  04/27/2018  . IR US GUIDE VASC ACCESS RIGHT  02/18/2018  . TEE WITHOUT CARDIOVERSION N/A 04/29/2018   Procedure: TRANSESOPHAGEAL ECHOCARDIOGRAM (TEE);  Surgeon: Sueanne Margarita, MD;  Location: [facility] ENDOSCOPY;  Service: Cardiovascular;  Laterality: N/A;  . THROMBECTOMY AND REVISION OF ARTERIOVENTOUS (AV) GORETEX  GRAFT Left 12/11/2018   Procedure: THROMBECTOMY AND  REVISION  ARTERIOVENTOUS GORETEX  GRAFT LEFT ARM;  Surgeon: [name], MD;  Location: [facility];  Service: Vascular;  Laterality: Left;  Marland Kitchen VENOGRAM Left 10/27/2018   Procedure: LEFT CENTRAL VENOGRAM;  Surgeon: Marty Heck, MD;  Location: [facility];  Service: Vascular;  Laterality: Left;     OB History   No obstetric history on file.      Home Medications    Prior to Admission  medications   Medication Sig Start Date End Date Taking? Authorizing Provider  acetaminophen (TYLENOL) 500 MG tablet Take 1,000 mg by mouth every 6 (six) hours as needed for mild pain.   Yes [provider]  amLODipine (NORVASC) 5 MG tablet Take 1 tablet (5 mg total) by mouth at bedtime. 10/13/18  Yes Alphonzo Grieve, MD  B Complex-C-Zn-Folic Acid (DIALYVITE 628-ZMOQ 15) 0.8 MG TABS Take 1 tablet by mouth daily. 11/04/18  Yes [provider]  calcitRIOL (ROCALTROL) 0.25 MCG capsule Take 1 capsule (0.25 mcg total) by mouth every Monday, Wednesday, and Friday with hemodialysis. 10/29/18  Yes Isabelle Course, MD  diphenhydrAMINE (BENADRYL) 2 % cream Apply 1 application topically 3 (three) times daily as needed for itching.   Yes [provider]  fluticasone (FLONASE) 50 MCG/ACT nasal spray Place 2 sprays into both nostrils daily.   Yes [provider]  insulin aspart (NOVOLOG) 100 UNIT/ML injection Inject 5 Units into the skin 3 (three) times daily with meals. Patient taking differently: Inject 0-9 Units into the skin 3 (three) times daily with  meals.  10/13/18  Yes Alphonzo Grieve, MD  insulin glargine (LANTUS) 100 UNIT/ML injection Inject 0.03 mLs (3 Units total) into the skin at bedtime. Patient taking differently: Inject 5 Units into the skin at bedtime.  10/29/18  Yes Isabelle Course, MD  pantoprazole (PROTONIX) 40 MG tablet Take 1 tablet (40 mg total) by mouth daily. 11/21/18  Yes Ghimire, Henreitta Leber, MD  sevelamer carbonate (RENVELA) 800 MG tablet Take 2 tablets (1,600 mg total) by mouth 3 (three) times daily with meals. 11/26/18  Yes Dana Allan I, MD  Blood Glucose Monitoring Suppl (ACCU-CHEK AVIVA) device Use as instructed 10/29/18 10/29/19  Isabelle Course, MD  glucose blood (ACCU-CHEK AVIVA) test strip Use twice per day. 10/29/18   Isabelle Course, MD  Insulin Syringe-Needle U-100 (INSULIN SYRINGE .3CC/31GX5/16") 31G X 5/16" 0.3 ML MISC Inject 1 each into the skin 4  (four) times daily. 06/27/18   Clent Demark, PA-C  Lancets (ACCU-CHEK SOFT [facility]) lancets Use twice per day. 10/29/18   Isabelle Course, MD  vancomycin (VANCOCIN) 125 MG capsule Take 1 capsule (125 mg total) by mouth 4 (four) times daily. Patient not taking: Reported on 12/18/2018 12/12/18   Caren Griffins, MD    Family History Family History  Problem Relation Age of Onset  . Autoimmune disease Neg Hx     Social History Social History   Tobacco Use  . Smoking status: Current Every Day Smoker    Packs/day: 0.50    Years: 48.00    Pack years: 24.00    Types: Cigarettes  . Smokeless tobacco: Never Used  . Tobacco comment: trying to quit ; smoking 1 1/2 cigarettes/day  (09/22/2018)  Substance Use Topics  . Alcohol use: Not Currently    Comment: 09/22/2018 "stopped about 6 months ago"  . Drug use: Yes    Types: Cocaine, Heroin    Comment: See notes     Allergies   Acetaminophen and Hydroxyzine   Review of Systems Review of Systems  Constitutional: Negative for chills and fever.  Respiratory: Positive for cough (chronic) and shortness of breath.   Cardiovascular: Negative for chest pain.  Gastrointestinal: Positive for abdominal pain, diarrhea, nausea and vomiting. Negative for blood in stool.  All other systems reviewed and are negative.    Physical Exam Updated Vital Signs BP (!) 141/75   Pulse (!) 104   Temp 100 F (37.8 C) (Rectal)   Resp 20   Ht 5\' 2"  (1.575 m)   SpO2 94%   BMI 22.02 kg/m   Physical Exam Vitals signs and nursing note reviewed.  Constitutional:      General: She is not in acute distress.    Appearance: She is well-developed.     Comments: Resting in bed, appears anxious  HENT:     Head: Normocephalic and atraumatic.  Eyes:     General:        Right eye: No discharge.        Left eye: No discharge.     Conjunctiva/sclera: Conjunctivae normal.  Neck:     Vascular: No JVD.     Trachea: No tracheal deviation.  Cardiovascular:      Rate and Rhythm: Tachycardia present.  Pulmonary:     Comments: Bibasilar crackles, scattered rhonchi.  Speaking in full sentences without difficulty Abdominal:     General: Abdomen is flat. Bowel sounds are normal. There is no distension.     Palpations: Abdomen is soft.     Tenderness:  There is generalized abdominal tenderness. There is no right CVA tenderness, left CVA tenderness, guarding or rebound.  Skin:    General: Skin is warm and dry.     Findings: No erythema.  Neurological:     Mental Status: She is alert.  Psychiatric:        Behavior: Behavior normal.      ED Treatments / Results  Labs (all labs ordered are listed, but only abnormal results are displayed) Labs Reviewed  CBC WITH DIFFERENTIAL/PLATELET - Abnormal; Notable for the following components:      Result Value   Hemoglobin 10.0 (*)    HCT 32.8 (*)    MCV 76.8 (*)    MCH 23.4 (*)    RDW 19.9 (*)    All other components within normal limits  COMPREHENSIVE METABOLIC PANEL - Abnormal; Notable for the following components:   Potassium 5.6 (*)    CO2 17 (*)    Glucose, Bld 104 (*)    BUN 78 (*)    Creatinine, Ser 15.10 (*)    Calcium 7.8 (*)    Albumin 2.8 (*)    GFR calc non Af Amer 2 (*)    GFR calc Af Amer 3 (*)    Anion gap 18 (*)    All other components within normal limits  LIPASE, BLOOD - Abnormal; Notable for the following components:   Lipase 55 (*)    All other components within normal limits  LACTIC ACID, PLASMA  URINALYSIS, ROUTINE W REFLEX MICROSCOPIC  RAPID URINE DRUG SCREEN, HOSP PERFORMED  LACTIC ACID, PLASMA  BASIC METABOLIC PANEL  CBC    EKG EKG Interpretation  Date/Time:  Thursday December 18 2018 18:03:19 EDT Ventricular Rate:  107 PR Interval:    QRS Duration: 76 QT Interval:  341 QTC Calculation: 455 R Axis:   -57 Text Interpretation:  Sinus tachycardia Left anterior fascicular block Left ventricular hypertrophy Confirmed by Julianne Rice [phone]) on 12/18/2018 7:46:13  PM   Radiology Dg Abdomen Acute W/chest  Result Date: 12/18/2018 CLINICAL DATA:  Abdominal pain and vomiting EXAM: DG ABDOMEN ACUTE W/ 1V CHEST COMPARISON:  Chest radiograph December 09, 2018. FINDINGS: PA chest: There is patchy airspace opacity throughout both lungs, slightly more on the left than on the right. There is cardiomegaly with pulmonary vascularity within normal limits. No adenopathy. There is aortic atherosclerosis. Central catheter tip is in the superior vena cava. Supine and upright abdomen: There is no bowel dilatation or air-fluid level to suggest bowel obstruction. No free air. There are surgical clips in the gallbladder fossa region. There are foci of vascular calcification in the pelvis. IMPRESSION: Airspace opacity throughout both lungs, slightly more on the left than on the right. Suspect multifocal pneumonia. Atypical organism pneumonia may present in this manner. There is cardiomegaly. Aortic Atherosclerosis (ICD10-I70.0). No bowel obstruction or free air evident. Surgical clips in right upper quadrant. Electronically Signed   By: Lowella Grip III M.D.   On: 12/18/2018 19:44    Procedures Procedures (including critical care time)  Medications Ordered in ED Medications  fentaNYL (SUBLIMAZE) injection 50 mcg (has no administration in time range)  vancomycin (VANCOCIN) 125 MG capsule 125 mg (has no administration in time range)  acetaminophen (TYLENOL) tablet 1,000 mg (has no administration in time range)  amLODipine (NORVASC) tablet 5 mg (has no administration in time range)  fluticasone (FLONASE) 50 MCG/ACT nasal spray 2 spray (has no administration in time range)  insulin aspart (novoLOG) injection 5 Units (has  no administration in time range)  insulin glargine (LANTUS) injection 3 Units (has no administration in time range)  pantoprazole (PROTONIX) EC tablet 40 mg (has no administration in time range)  sevelamer carbonate (RENVELA) tablet 1,600 mg (has no  administration in time range)  ondansetron (ZOFRAN) injection 4 mg (has no administration in time range)  ondansetron (ZOFRAN) injection 4 mg (4 mg Intravenous Given 12/18/18 1856)  morphine 4 MG/ML injection 4 mg (4 mg Intravenous Given 12/18/18 1955)     Initial Impression / Assessment and Plan / ED Course  I have reviewed the triage vital signs and the nursing notes.  Pertinent labs & imaging results that were available during my care of the patient were reviewed by me and considered in my medical decision making (see chart for details).        Patient presenting for evaluation of nausea vomiting diarrhea, abdominal pain, shortness of breath.  She is noncompliant with dialysis and her medications at home.  She is afebrile, persistently tachycardic and somewhat tachypneic while in the ED.  SPO2 saturations stable while at rest.  Nonfocal abdominal examination and she had a CT scan 2 weeks ago which was negative for any acute process.  Lab work reviewed by me significant for stable anemia, no leukocytosis.  Elevated creatinine and BUN with some hyperkalemia noted as well potassium 5.6.  Her lipase is also mildly elevated but not so elevated as to suggest pancreatitis.  Radiographs of the chest and abdomen show no evidence of bowel obstruction or free air.  She does have airspace opacities throughout both lungs slightly more on the left than the right.  Question possible multifocal pneumonia, however I think her presentation is far more consistent with volume overload.  She is afebrile, lactate negative, no leukocytosis.  She has tested negative for COVID-19. 9:12 PM Spoke with Dr. Moshe Cipro with nephrology. Recommends dialysis in the AM. 9:59 PM Spoke with Dr. Avon Gully with [facility] hospitalist service who agrees to assume care of patient and bring her into the hospital for further evaluation and management.  Final Clinical Impressions(s) / ED Diagnoses   Final diagnoses:  Hyperkalemia  ESRD  (end stage renal disease) ([place])  Nausea vomiting and diarrhea    ED Discharge Orders    None       Debroah Baller 12/18/18 2323    Julianne Rice, MD 12/23/18 [phone]

## 2018-12-18 NOTE — ED Notes (Signed)
ED TO INPATIENT HANDOFF REPORT  ED Nurse Name and Phone #: Benjamine Mola [phone]  S Name/Age/Gender Rhonda Lynch 63 y.o. female Room/Bed: 026C/026C  Code Status   Code Status: Full Code  Home/SNF/Other Home Patient oriented to: self, place, time and situation Is this baseline? Yes   Triage Complete: Triage complete  Chief Complaint SOB,Cough   Triage Note Pt reports 1 episode of vomiting and abdominal pain. She has had 2-3 episodes of diarrhea.    Allergies Allergies  Allergen Reactions  . Acetaminophen Nausea And Vomiting    Patient states Acetaminophen and Acetaminophen containing products make her nauseated. She demonstrated this 06/21/18 with nausea followed by emesis.  Marland Kitchen Hydroxyzine Itching    Level of Care/Admitting Diagnosis ED Disposition    ED Disposition Condition Comment   Admit  Hospital Area: [facility] [[id number]]  Level of Care: Telemetry Cardiac [103]  I expect the patient will be discharged within 24 hours: No (not a candidate for 5C-Observation unit)  Diagnosis: Hyperkalemia [709628]  Admitting Physician: Little Ishikawa [[id number]]  Attending Physician: Little Ishikawa [phone]  PT Class (Do Not Modify): Observation [104]  PT Acc Code (Do Not Modify): Observation [10022]       B Medical/Surgery History Past Medical History:  Diagnosis Date  . Diabetes mellitus without complication ([place])   . ESRD (end stage renal disease) on dialysis ([place])    "TTS; [facility]; they're moving me to another one" (09/22/2018)  . Hepatitis C    diagnosed 09/2018  . Hypertension   . Oxygen deficiency    2 L at night  . Stroke ([place])   . Substance abuse ([place])    has been clean for 4 months    Past Surgical History:  Procedure Laterality Date  . AV FISTULA PLACEMENT Left 03/05/2018   Procedure: ARTERIOVENOUS (AV) FISTULA CREATION  LEFT UPPER EXTREMITY;  Surgeon: [name], MD;  Location: [place] [facility];  Service: Vascular;  Laterality:  Left;  . AV FISTULA PLACEMENT Left 07/21/2018   Procedure: ARTERIOVENOUS (AV) FISTULA CREATION;  Surgeon: Marty Heck, MD;  Location: [facility];  Service: Vascular;  Laterality: Left;  . AV FISTULA PLACEMENT Left 10/27/2018   Procedure: INSERTION OF ARTERIOVENOUS (AV) GORE-TEX GRAFT UPPER ARM;  Surgeon: Marty Heck, MD;  Location: [facility];  Service: Vascular;  Laterality: Left;  . BASCILIC VEIN TRANSPOSITION Left 09/29/2018   Procedure: BASILIC VEIN TRANSPOSITION SECOND STAGE LEFT UPPER ARM;  Surgeon: Marty Heck, MD;  Location: [facility];  Service: Vascular;  Laterality: Left;  . HEMATOMA EVACUATION Left 03/06/2018   Procedure: EVACUATION HEMATOMA LEFT ARM;  Surgeon: Angelia Mould, MD;  Location: [facility];  Service: Vascular;  Laterality: Left;  . I&D EXTREMITY Left 10/06/2018   Procedure: IRRIGATION AND DEBRIDEMENT ARM;  Surgeon: Marty Heck, MD;  Location: [facility];  Service: Vascular;  Laterality: Left;  . INSERTION OF DIALYSIS CATHETER Left 04/29/2018   Procedure: INSERTION OF DIALYSIS CATHETER LEFT INTERNAL JUGULAR;  Surgeon: Angelia Mould, MD;  Location: [facility];  Service: Vascular;  Laterality: Left;  . IR FLUORO GUIDE CV LINE RIGHT  02/18/2018  . IR FLUORO GUIDE CV LINE RIGHT  02/26/2018  . IR REMOVAL TUN CV CATH W/O FL  04/27/2018  . IR US GUIDE VASC ACCESS RIGHT  02/18/2018  . TEE WITHOUT CARDIOVERSION N/A 04/29/2018   Procedure: TRANSESOPHAGEAL ECHOCARDIOGRAM (TEE);  Surgeon: Sueanne Margarita, MD;  Location: [facility];  Service: Cardiovascular;  Laterality: N/A;  .  THROMBECTOMY AND REVISION OF ARTERIOVENTOUS (AV) GORETEX  GRAFT Left 12/11/2018   Procedure: THROMBECTOMY AND  REVISION  ARTERIOVENTOUS GORETEX  GRAFT LEFT ARM;  Surgeon: [name], MD;  Location: [facility];  Service: Vascular;  Laterality: Left;  Marland Kitchen VENOGRAM Left 10/27/2018   Procedure: LEFT CENTRAL VENOGRAM;  Surgeon: Marty Heck, MD;  Location: [facility] OR;  Service: Vascular;  Laterality:  Left;     A IV Location/Drains/Wounds Patient Lines/Drains/Airways Status   Active Line/Drains/Airways    Name:   Placement date:   Placement time:   Site:   Days:   Peripheral IV 12/18/18 Posterior;Right Hand   12/18/18    1830    Hand   less than 1   Fistula / Graft Left Upper arm Arteriovenous vein graft   10/27/18    1200    Upper arm   52   Fistula / Graft Left Upper arm Arteriovenous vein graft   12/11/18    1107    Upper arm   7   Hemodialysis Catheter Right Subclavian Double-lumen   -    -    Subclavian      Incision (Closed) 12/11/18 Arm Left   12/11/18    1035     7          Intake/Output Last 24 hours No intake or output data in the 24 hours ending 12/18/18 2252  Labs/Imaging Results for orders placed or performed during the hospital encounter of 12/18/18 (from the past 48 hour(s))  CBC with Differential     Status: Abnormal   Collection Time: 12/18/18  6:38 PM  Result Value Ref Range   WBC 9.7 4.0 - 10.5 K/uL   RBC 4.27 3.87 - 5.11 MIL/uL   Hemoglobin 10.0 (L) 12.0 - 15.0 g/dL   HCT 32.8 (L) 36.0 - 46.0 %   MCV 76.8 (L) 80.0 - 100.0 fL   MCH 23.4 (L) 26.0 - 34.0 pg   MCHC 30.5 30.0 - 36.0 g/dL   RDW 19.9 (H) 11.5 - 15.5 %   Platelets 235 150 - 400 K/uL    Comment: REPEATED TO VERIFY   nRBC 0.0 0.0 - 0.2 %   Neutrophils Relative % 68 %   Neutro Abs 6.6 1.7 - 7.7 K/uL   Lymphocytes Relative 21 %   Lymphs Abs 2.0 0.7 - 4.0 K/uL   Monocytes Relative 11 %   Monocytes Absolute 1.0 0.1 - 1.0 K/uL   Eosinophils Relative 0 %   Eosinophils Absolute 0.0 0.0 - 0.5 K/uL   Basophils Relative 0 %   Basophils Absolute 0.0 0.0 - 0.1 K/uL   Immature Granulocytes 0 %   Abs Immature Granulocytes 0.03 0.00 - 0.07 K/uL    Comment: Performed at [facility], [address] [place], [place] [address]  Comprehensive metabolic panel     Status: Abnormal   Collection Time: 12/18/18  6:38 PM  Result Value Ref Range   Sodium 137 135 - 145 mmol/L   Potassium 5.6 (H) 3.5 -  5.1 mmol/L   Chloride 102 98 - 111 mmol/L   CO2 17 (L) 22 - 32 mmol/L   Glucose, Bld 104 (H) 70 - 99 mg/dL   BUN 78 (H) 8 - 23 mg/dL   Creatinine, Ser 15.10 (H) 0.44 - 1.00 mg/dL   Calcium 7.8 (L) 8.9 - 10.3 mg/dL   Total Protein 7.5 6.5 - 8.1 g/dL   Albumin 2.8 (L) 3.5 - 5.0 g/dL  AST 26 15 - 41 U/L   ALT 15 0 - 44 U/L   Alkaline Phosphatase 87 38 - 126 U/L   Total Bilirubin 0.6 0.3 - 1.2 mg/dL   GFR calc non Af Amer 2 (L) >60 mL/min   GFR calc Af Amer 3 (L) >60 mL/min   Anion gap 18 (H) 5 - 15    Comment: Performed at [facility] [address], [place], [place] [address]  Lipase, blood     Status: Abnormal   Collection Time: 12/18/18  6:38 PM  Result Value Ref Range   Lipase 55 (H) 11 - 51 U/L    Comment: Performed at [facility] [address], [place], [place] [address]  Lactic acid, plasma     Status: None   Collection Time: 12/18/18  9:08 PM  Result Value Ref Range   Lactic Acid, Venous 1.0 0.5 - 1.9 mmol/L    Comment: Performed at [facility] [address] [place], [place] [address]   Dg Abdomen Acute W/chest  Result Date: 12/18/2018 CLINICAL DATA:  Abdominal pain and vomiting EXAM: DG ABDOMEN ACUTE W/ 1V CHEST COMPARISON:  Chest radiograph December 09, 2018. FINDINGS: PA chest: There is patchy airspace opacity throughout both lungs, slightly more on the left than on the right. There is cardiomegaly with pulmonary vascularity within normal limits. No adenopathy. There is aortic atherosclerosis. Central catheter tip is in the superior vena cava. Supine and upright abdomen: There is no bowel dilatation or air-fluid level to suggest bowel obstruction. No free air. There are surgical clips in the gallbladder fossa region. There are foci of vascular calcification in the pelvis. IMPRESSION: Airspace opacity throughout both lungs, slightly more on the left than on the right. Suspect multifocal pneumonia. Atypical organism pneumonia may present in this manner.  There is cardiomegaly. Aortic Atherosclerosis (ICD10-I70.0). No bowel obstruction or free air evident. Surgical clips in right upper quadrant. Electronically Signed   By: Lowella Grip III M.D.   On: 12/18/2018 19:44    Pending Labs Unresulted Labs (From admission, onward)    Start     Ordered   12/19/18 9326  Basic metabolic panel  Tomorrow morning,   R     12/18/18 2235   12/19/18 0500  CBC  Tomorrow morning,   R     12/18/18 2235   12/18/18 2043  Lactic acid, plasma  Now then every 2 hours,   STAT     12/18/18 2042   12/18/18 1838  Rapid urine drug screen (hospital performed)  ONCE - STAT,   R     12/18/18 1837   12/18/18 1834  Urinalysis, Routine w reflex microscopic  (ED Abdominal Pain)  ONCE - STAT,   STAT     12/18/18 1833          Vitals/Pain Today's Vitals   12/18/18 2132 12/18/18 2135 12/18/18 2159 12/18/18 2245  BP:    (!) 141/75  Pulse:    (!) 104  Resp: (!) 23   20  Temp:      TempSrc:      SpO2:    94%  Height:      PainSc:  Asleep Asleep     Isolation Precautions No active isolations  Medications Medications  fentaNYL (SUBLIMAZE) injection 50 mcg (has no administration in time range)  vancomycin (VANCOCIN) 125 MG capsule 125 mg (has no administration in time range)  acetaminophen (TYLENOL) tablet 1,000 mg (has no administration  in time range)  amLODipine (NORVASC) tablet 5 mg (has no administration in time range)  fluticasone (FLONASE) 50 MCG/ACT nasal spray 2 spray (has no administration in time range)  insulin aspart (novoLOG) injection 5 Units (has no administration in time range)  insulin glargine (LANTUS) injection 3 Units (has no administration in time range)  pantoprazole (PROTONIX) EC tablet 40 mg (has no administration in time range)  sevelamer carbonate (RENVELA) tablet 1,600 mg (has no administration in time range)  ondansetron (ZOFRAN) injection 4 mg (has no administration in time range)  ondansetron (ZOFRAN) injection 4 mg (4 mg  Intravenous Given 12/18/18 1856)  morphine 4 MG/ML injection 4 mg (4 mg Intravenous Given 12/18/18 1955)    Mobility walks with person assist Low fall risk   Focused Assessments Renal Assessment Handoff:  Hemodialysis Schedule: Hemodialysis Schedule: Tuesday/Thursday/Saturday Last Hemodialysis date and time: Saturday, missed two days   Restricted appendage: left arm     R Recommendations: See Admitting Provider Note  Report given to:   Additional Notes:

## 2018-12-18 NOTE — ED Notes (Signed)
Notified EDP that pt is complaining of pain.

## 2018-12-18 NOTE — ED Notes (Signed)
Pt is very dramatic, continues to rock and move about in the bed, making noises. She has been given some ice chips and morphine which may help her to be less restless and calm.

## 2018-12-18 NOTE — ED Notes (Signed)
Per RN Benjamine Mola pt can have a small amount of ice chips. Pt given the same.

## 2018-12-18 NOTE — H&P (Addendum)
History and Physical    MEGHA AGNES VZC:[id number] DOB: 09-Oct-1955 DOA: 12/18/2018  PCP: Clent Demark, PA-C   Patient coming from: Home  Chief Complaint: Intractable nausea vomiting diarrhea  HPI: Rhonda BENETT is a 63 y.o. 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  She was apparently at dialysis today but left due to her purse being misplaced sequently missed her dialysis spot.  Patient indicates to me that she is "here for dialysis" that her nausea vomiting and diarrhea have been ongoing since last hospitalization (admitted 3/25 discharged on 12/12/2018).  Was admitted at that time for acute respiratory failure 2/2 pulmonary edema and diagnosed with C. Diff colitis. Patient has been markedly noncompliant with her antibiotics and dialysis per chart review this is not uncommon.  She otherwise declines, fevers, chills, shortness of breath, chest pain.  ED Course: Patient presents to the ED with complaints of vomiting diarrhea as above, noted to be tachypneic, tachycardic with a labile blood pressure 109/96-170/99.  Labs were remarkable for an gap of 18, serum 5.6, 8, creatinine 15.10, hemoglobin at 10.0.  Patient received 50 mcg of fentanyl, 4 mg Zofran and 4 mg morphine the ED.  Given her volume overload, hyperkalemia lab abnormalities in this dialysis nephrology was called by ED who recommended observation overnight with dialysis.  The ED patient continues to complain of intractable nausea and pain although has had no episodes of vomiting or diarrhea since admission has been resting comfortably over the past few hours without repeat narcotic medications.  Review of Systems: As per HPI otherwise 10 point review of  systems negative.    Past Medical History:  Diagnosis Date  . Diabetes mellitus without complication ([place])   . ESRD (end stage renal disease) on dialysis ([place])    "TTS; [facility]; they're moving me to another one" (09/22/2018)  . Hepatitis C    diagnosed 09/2018  . Hypertension   . Oxygen deficiency    2 L at night  . Stroke ([place])   . Substance abuse ([place])    has been clean for 4 months     Past Surgical History:  Procedure Laterality Date  . AV FISTULA PLACEMENT Left 03/05/2018   Procedure: ARTERIOVENOUS (AV) FISTULA CREATION  LEFT UPPER EXTREMITY;  Surgeon: [name], MD;  Location: [facility];  Service: Vascular;  Laterality: Left;  . AV FISTULA PLACEMENT Left 07/21/2018   Procedure: ARTERIOVENOUS (AV) FISTULA CREATION;  Surgeon: Marty Heck, MD;  Location: [facility];  Service: Vascular;  Laterality: Left;  . AV FISTULA PLACEMENT Left 10/27/2018   Procedure: INSERTION OF ARTERIOVENOUS (AV) GORE-TEX GRAFT UPPER ARM;  Surgeon: Marty Heck, MD;  Location: [facility];  Service: Vascular;  Laterality: Left;  . BASCILIC VEIN TRANSPOSITION Left 09/29/2018   Procedure: BASILIC VEIN TRANSPOSITION SECOND STAGE LEFT UPPER ARM;  Surgeon: Marty Heck, MD;  Location: [facility];  Service: Vascular;  Laterality: Left;  . HEMATOMA EVACUATION Left 03/06/2018   Procedure: EVACUATION HEMATOMA LEFT ARM;  Surgeon: Angelia Mould, MD;  Location: [facility];  Service: Vascular;  Laterality: Left;  . I&D EXTREMITY Left 10/06/2018   Procedure: IRRIGATION AND DEBRIDEMENT ARM;  Surgeon: Marty Heck, MD;  Location: [facility];  Service: Vascular;  Laterality: Left;  . INSERTION OF DIALYSIS CATHETER Left  04/29/2018   Procedure: INSERTION OF DIALYSIS CATHETER LEFT INTERNAL JUGULAR;  Surgeon: Angelia Mould, MD;  Location: [facility];  Service: Vascular;  Laterality: Left;  . IR FLUORO GUIDE CV LINE RIGHT  02/18/2018  . IR FLUORO GUIDE CV LINE RIGHT  02/26/2018  . IR REMOVAL TUN CV CATH W/O  FL  04/27/2018  . IR US GUIDE VASC ACCESS RIGHT  02/18/2018  . TEE WITHOUT CARDIOVERSION N/A 04/29/2018   Procedure: TRANSESOPHAGEAL ECHOCARDIOGRAM (TEE);  Surgeon: Sueanne Margarita, MD;  Location: [facility];  Service: Cardiovascular;  Laterality: N/A;  . THROMBECTOMY AND REVISION OF ARTERIOVENTOUS (AV) GORETEX  GRAFT Left 12/11/2018   Procedure: THROMBECTOMY AND  REVISION  ARTERIOVENTOUS GORETEX  GRAFT LEFT ARM;  Surgeon: [name], MD;  Location: [facility];  Service: Vascular;  Laterality: Left;  Marland Kitchen VENOGRAM Left 10/27/2018   Procedure: LEFT CENTRAL VENOGRAM;  Surgeon: Marty Heck, MD;  Location: [facility];  Service: Vascular;  Laterality: Left;     reports that she has been smoking cigarettes. She has a 24.00 pack-year smoking history. She has never used smokeless tobacco. She reports previous alcohol use. She reports current drug use. Drugs: Cocaine and Heroin.  Allergies  Allergen Reactions  . Acetaminophen Nausea And Vomiting    Patient states Acetaminophen and Acetaminophen containing products make her nauseated. She demonstrated this 06/21/18 with nausea followed by emesis.  Marland Kitchen Hydroxyzine Itching    Family History  Problem Relation Age of Onset  . Autoimmune disease Neg Hx     Prior to Admission medications   Medication Sig Start Date End Date Taking? Authorizing Provider  acetaminophen (TYLENOL) 500 MG tablet Take 1,000 mg by mouth every 6 (six) hours as needed for mild pain.   Yes [provider]  amLODipine (NORVASC) 5 MG tablet Take 1 tablet (5 mg total) by mouth at bedtime. 10/13/18  Yes Alphonzo Grieve, MD  B Complex-C-Zn-Folic Acid (DIALYVITE 269-SWNI 15) 0.8 MG TABS Take 1 tablet by mouth daily. 11/04/18  Yes [provider]  calcitRIOL (ROCALTROL) 0.25 MCG capsule Take 1 capsule (0.25 mcg total) by mouth every Monday, Wednesday, and Friday with hemodialysis. 10/29/18  Yes Isabelle Course, MD  diphenhydrAMINE (BENADRYL) 2 % cream Apply 1 application  topically 3 (three) times daily as needed for itching.   Yes [provider]  fluticasone (FLONASE) 50 MCG/ACT nasal spray Place 2 sprays into both nostrils daily.   Yes [provider]  insulin aspart (NOVOLOG) 100 UNIT/ML injection Inject 5 Units into the skin 3 (three) times daily with meals. Patient taking differently: Inject 0-9 Units into the skin 3 (three) times daily with meals.  10/13/18  Yes Alphonzo Grieve, MD  insulin glargine (LANTUS) 100 UNIT/ML injection Inject 0.03 mLs (3 Units total) into the skin at bedtime. Patient taking differently: Inject 5 Units into the skin at bedtime.  10/29/18  Yes Isabelle Course, MD  pantoprazole (PROTONIX) 40 MG tablet Take 1 tablet (40 mg total) by mouth daily. 11/21/18  Yes Ghimire, Henreitta Leber, MD  sevelamer carbonate (RENVELA) 800 MG tablet Take 2 tablets (1,600 mg total) by mouth 3 (three) times daily with meals. 11/26/18  Yes Dana Allan I, MD  Blood Glucose Monitoring Suppl (ACCU-CHEK AVIVA) device Use as instructed 10/29/18 10/29/19  Isabelle Course, MD  glucose blood (ACCU-CHEK AVIVA) test strip Use twice per day. 10/29/18   Isabelle Course, MD  Insulin Syringe-Needle U-100 (INSULIN SYRINGE .3CC/31GX5/16") 31G X 5/16" 0.3 ML MISC Inject  1 each into the skin 4 (four) times daily. 06/27/18   Clent Demark, PA-C  Lancets (ACCU-CHEK SOFT [facility]) lancets Use twice per day. 10/29/18   Isabelle Course, MD  vancomycin (VANCOCIN) 125 MG capsule Take 1 capsule (125 mg total) by mouth 4 (four) times daily. Patient not taking: Reported on 12/18/2018 12/12/18   Caren Griffins, MD    Physical Exam: Vitals:   12/18/18 2100 12/18/18 2119 12/18/18 2130 12/18/18 2132  BP: (!) 159/86  (!) 144/78   Pulse: (!) 116  (!) 115   Resp: (!) 24   (!) 23  Temp:  100 F (37.8 C)    TempSrc:  Rectal    SpO2: 94%  93%   Height:        Constitutional: NAD, calm, comfortable Vitals:   12/18/18 2100 12/18/18 2119 12/18/18 2130 12/18/18 2132  BP: (!)  159/86  (!) 144/78   Pulse: (!) 116  (!) 115   Resp: (!) 24   (!) 23  Temp:  100 F (37.8 C)    TempSrc:  Rectal    SpO2: 94%  93%   Height:       General:  Pleasantly resting in bed, No acute distress. HEENT:  Normocephalic atraumatic.  Sclerae nonicteric, noninjected.  Extraocular movements intact bilaterally. Neck:  Without mass or deformity.  Trachea is midline. Lungs:  Clear to auscultate bilaterally without rhonchi, wheeze, or rales. Heart:  Regular rate and rhythm.  Without murmurs, rubs, or gallops. Abdomen:  Soft, nontender, nondistended.  Without guarding or rebound. Extremities: Without cyanosis, clubbing, edema, or obvious deformity. Vascular:  Dorsalis pedis and posterior tibial pulses palpable bilaterally. Skin:  Warm and dry, no erythema, no ulcerations.  Labs on Admission: I have personally reviewed following labs and imaging studies  CBC: Recent Labs  Lab 12/18/18 1838  WBC 9.7  NEUTROABS 6.6  HGB 10.0*  HCT 32.8*  MCV 76.8*  PLT 329   Basic Metabolic Panel: Recent Labs  Lab 12/18/18 1838  NA 137  K 5.6*  CL 102  CO2 17*  GLUCOSE 104*  BUN 78*  CREATININE 15.10*  CALCIUM 7.8*   GFR: Estimated Creatinine Clearance: 2.9 mL/min (A) (by C-G formula based on SCr of 15.1 mg/dL (H)). Liver Function Tests: Recent Labs  Lab 12/18/18 1838  AST 26  ALT 15  ALKPHOS 87  BILITOT 0.6  PROT 7.5  ALBUMIN 2.8*   Recent Labs  Lab 12/18/18 1838  LIPASE 55*   No results for input(s): AMMONIA in the last 168 hours. Coagulation Profile: No results for input(s): INR, PROTIME in the last 168 hours. Cardiac Enzymes: No results for input(s): CKTOTAL, CKMB, CKMBINDEX, TROPONINI in the last 168 hours. BNP (last 3 results) No results for input(s): PROBNP in the last 8760 hours. HbA1C: No results for input(s): HGBA1C in the last 72 hours. CBG: Recent Labs  Lab 12/12/18 0722 12/12/18 1125  GLUCAP 115* 161*   Lipid Profile: No results for input(s):  CHOL, HDL, LDLCALC, TRIG, CHOLHDL, LDLDIRECT in the last 72 hours. Thyroid Function Tests: No results for input(s): TSH, T4TOTAL, FREET4, T3FREE, THYROIDAB in the last 72 hours. Anemia Panel: No results for input(s): VITAMINB12, FOLATE, FERRITIN, TIBC, IRON, RETICCTPCT in the last 72 hours. Urine analysis:    Component Value Date/Time   COLORURINE YELLOW 12/03/2018 1208   APPEARANCEUR HAZY (A) 12/03/2018 1208   LABSPEC 1.020 12/03/2018 1208   PHURINE 6.0 12/03/2018 1208   GLUCOSEU 100 (A) 12/03/2018 1208  HGBUR LARGE (A) 12/03/2018 1208   HGBUR trace-intact 03/08/2010 1452   BILIRUBINUR NEGATIVE 12/03/2018 [place] 12/03/2018 1208   PROTEINUR >300 (A) 12/03/2018 1208   UROBILINOGEN 0.2 03/08/2010 1452   NITRITE NEGATIVE 12/03/2018 1208   LEUKOCYTESUR TRACE (A) 12/03/2018 1208    Radiological Exams on Admission: Dg Abdomen Acute W/chest  Result Date: 12/18/2018 CLINICAL DATA:  Abdominal pain and vomiting EXAM: DG ABDOMEN ACUTE W/ 1V CHEST COMPARISON:  Chest radiograph December 09, 2018. FINDINGS: PA chest: There is patchy airspace opacity throughout both lungs, slightly more on the left than on the right. There is cardiomegaly with pulmonary vascularity within normal limits. No adenopathy. There is aortic atherosclerosis. Central catheter tip is in the superior vena cava. Supine and upright abdomen: There is no bowel dilatation or air-fluid level to suggest bowel obstruction. No free air. There are surgical clips in the gallbladder fossa region. There are foci of vascular calcification in the pelvis. IMPRESSION: Airspace opacity throughout both lungs, slightly more on the left than on the right. Suspect multifocal pneumonia. Atypical organism pneumonia may present in this manner. There is cardiomegaly. Aortic Atherosclerosis (ICD10-I70.0). No bowel obstruction or free air evident. Surgical clips in right upper quadrant. Electronically Signed   By: Lowella Grip III M.D.    On: 12/18/2018 19:44    EKG: Independently reviewed.  Without peaked T waves, ST depressions or elevations  Assessment/Plan Principal Problem:   Hyperkalemia Active Problems:   Cocaine abuse (HCC)   Epigastric pain   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[place])   Non-compliance with renal dialysis ([place])   Type 2 diabetes mellitus (HCC)   Substance induced mood disorder (HCC)   End-stage renal disease on hemodialysis (HCC)  Acute intractable nausea, vomiting, diarrhea, likely multifactorial etiology -Patient recently admitted just with C. difficile colitis -been noncompliant with vancomycin dosing -reinitiate here -Uremia likely worsening symptoms in the setting of missed dialysis and noncompliance as below -Continue PRN Zofran  Hyperkalemia -Mild - follow after dialysis - hold off on treatment given no EKG changes   End-stage renal disease, dialysis Tuesday Thursday Saturday -Patient remarkably noncompliant, has missed at least 2 if not more dialysis sessions at this point -Nephrology aware of the patient, attempting to schedule dialysis later tonight or in the morning   Remarkable history of medication/lifestyle noncompliance -Lengthy discussion at bedside about need for medication compliance, lifestyle compliance and increased risk of morbidity mortality should patient continue noncompliance  Recent diagnosis of C. difficile colitis, POA, resolving - Continue vancomycin p.o. as above unclear stop date per recent discharge -continue for additional 48 hours follow symptomatically - Do not retest for C. Difficile  Chronic anemia of chronic disease, stable -Baseline, prior to nephrology for transfusion/EPO  Anion gap metabolic acidosis in the setting of uremia as above -In the setting of uremia - follow after dialysis -Unlikely in DKA/HHNKS - follow UA if patient able to provide  Insulin dependent diabetes type 2, poorly controlled -Continue home insulin  regimen -Continue SSI/Hypoglycemia protocol  Polypharmacy/illicit substance abuse -Patient unable/unwilling to provide urine for UDS -Known history of cocaine abuse -Lengthy discussion about need for cessation given elevated risk of morbidity and mortality  DVT prophylaxis: None, early ambulation, SCDs Code Status: Full Family Communication: None present Disposition Plan: Discharge home, likely next 24 to 48 hours pending provement in clinical course and tolerance of dialysis  Consults called: Nephrology Admission status: [facility]  If 7PM-7AM, please contact  night-coverage [email or web address] Password [facility]  12/18/2018, 10:48 PM

## 2018-12-18 NOTE — ED Notes (Signed)
Patient complaining of itchiness.  Dr Avon Gully notified.  New orders for benadryl po.  Patient medicated before going upstairs.

## 2018-12-18 NOTE — ED Notes (Signed)
Report attempted 

## 2018-12-19 DIAGNOSIS — D631 Anemia in chronic kidney disease: Secondary | ICD-10-CM | POA: Diagnosis present

## 2018-12-19 DIAGNOSIS — F1994 Other psychoactive substance use, unspecified with psychoactive substance-induced mood disorder: Secondary | ICD-10-CM

## 2018-12-19 DIAGNOSIS — E8770 Fluid overload, unspecified: Secondary | ICD-10-CM | POA: Diagnosis present

## 2018-12-19 DIAGNOSIS — E872 Acidosis: Secondary | ICD-10-CM | POA: Diagnosis present

## 2018-12-19 DIAGNOSIS — F1410 Cocaine abuse, uncomplicated: Secondary | ICD-10-CM

## 2018-12-19 DIAGNOSIS — A0472 Enterocolitis due to Clostridium difficile, not specified as recurrent: Secondary | ICD-10-CM | POA: Diagnosis present

## 2018-12-19 DIAGNOSIS — J9601 Acute respiratory failure with hypoxia: Secondary | ICD-10-CM

## 2018-12-19 DIAGNOSIS — I776 Arteritis, unspecified: Secondary | ICD-10-CM | POA: Diagnosis present

## 2018-12-19 DIAGNOSIS — R197 Diarrhea, unspecified: Secondary | ICD-10-CM

## 2018-12-19 DIAGNOSIS — Z888 Allergy status to other drugs, medicaments and biological substances status: Secondary | ICD-10-CM | POA: Diagnosis not present

## 2018-12-19 DIAGNOSIS — J8489 Other specified interstitial pulmonary diseases: Secondary | ICD-10-CM

## 2018-12-19 DIAGNOSIS — Z992 Dependence on renal dialysis: Secondary | ICD-10-CM

## 2018-12-19 DIAGNOSIS — E1122 Type 2 diabetes mellitus with diabetic chronic kidney disease: Secondary | ICD-10-CM | POA: Diagnosis present

## 2018-12-19 DIAGNOSIS — N186 End stage renal disease: Secondary | ICD-10-CM | POA: Diagnosis present

## 2018-12-19 DIAGNOSIS — R1013 Epigastric pain: Secondary | ICD-10-CM

## 2018-12-19 DIAGNOSIS — K562 Volvulus: Secondary | ICD-10-CM | POA: Diagnosis present

## 2018-12-19 DIAGNOSIS — Z9115 Patient's noncompliance with renal dialysis: Secondary | ICD-10-CM

## 2018-12-19 DIAGNOSIS — E8729 Other acidosis: Secondary | ICD-10-CM | POA: Diagnosis present

## 2018-12-19 DIAGNOSIS — Z86711 Personal history of pulmonary embolism: Secondary | ICD-10-CM | POA: Diagnosis not present

## 2018-12-19 DIAGNOSIS — Z79899 Other long term (current) drug therapy: Secondary | ICD-10-CM | POA: Diagnosis not present

## 2018-12-19 DIAGNOSIS — E11649 Type 2 diabetes mellitus with hypoglycemia without coma: Secondary | ICD-10-CM | POA: Diagnosis not present

## 2018-12-19 DIAGNOSIS — F17210 Nicotine dependence, cigarettes, uncomplicated: Secondary | ICD-10-CM | POA: Diagnosis present

## 2018-12-19 DIAGNOSIS — Z590 Homelessness: Secondary | ICD-10-CM | POA: Diagnosis not present

## 2018-12-19 DIAGNOSIS — E875 Hyperkalemia: Secondary | ICD-10-CM | POA: Diagnosis present

## 2018-12-19 DIAGNOSIS — Z9119 Patient's noncompliance with other medical treatment and regimen: Secondary | ICD-10-CM | POA: Diagnosis not present

## 2018-12-19 DIAGNOSIS — F1414 Cocaine abuse with cocaine-induced mood disorder: Secondary | ICD-10-CM | POA: Diagnosis present

## 2018-12-19 DIAGNOSIS — Z794 Long term (current) use of insulin: Secondary | ICD-10-CM | POA: Diagnosis not present

## 2018-12-19 DIAGNOSIS — N2581 Secondary hyperparathyroidism of renal origin: Secondary | ICD-10-CM | POA: Diagnosis present

## 2018-12-19 DIAGNOSIS — I120 Hypertensive chronic kidney disease with stage 5 chronic kidney disease or end stage renal disease: Secondary | ICD-10-CM | POA: Diagnosis present

## 2018-12-19 DIAGNOSIS — I150 Renovascular hypertension: Secondary | ICD-10-CM

## 2018-12-19 DIAGNOSIS — R112 Nausea with vomiting, unspecified: Secondary | ICD-10-CM

## 2018-12-19 DIAGNOSIS — Z8673 Personal history of transient ischemic attack (TIA), and cerebral infarction without residual deficits: Secondary | ICD-10-CM | POA: Diagnosis not present

## 2018-12-19 DIAGNOSIS — B1920 Unspecified viral hepatitis C without hepatic coma: Secondary | ICD-10-CM | POA: Diagnosis present

## 2018-12-19 LAB — BASIC METABOLIC PANEL
Anion gap: 18 — ABNORMAL HIGH (ref 5–15)
BUN: 81 mg/dL — ABNORMAL HIGH (ref 8–23)
CO2: 16 mmol/L — ABNORMAL LOW (ref 22–32)
Calcium: 7.5 mg/dL — ABNORMAL LOW (ref 8.9–10.3)
Chloride: 101 mmol/L (ref 98–111)
Creatinine, Ser: 15.6 mg/dL — ABNORMAL HIGH (ref 0.44–1.00)
GFR calc Af Amer: 2 mL/min — ABNORMAL LOW (ref >60)
GFR calc non Af Amer: 2 mL/min — ABNORMAL LOW (ref >60)
Glucose, Bld: 87 mg/dL (ref 70–99)
Potassium: 5.5 mmol/L — ABNORMAL HIGH (ref 3.5–5.1)
Sodium: 135 mmol/L (ref 135–145)

## 2018-12-19 LAB — GLUCOSE, CAPILLARY
Glucose-Capillary: 200 mg/dL — ABNORMAL HIGH (ref 70–99)
Glucose-Capillary: 47 mg/dL — ABNORMAL LOW (ref 70–99)
Glucose-Capillary: 73 mg/dL (ref 70–99)
Glucose-Capillary: 84 mg/dL (ref 70–99)
Glucose-Capillary: 94 mg/dL (ref 70–99)

## 2018-12-19 LAB — MRSA PCR SCREENING: MRSA by PCR: NEGATIVE

## 2018-12-19 LAB — LACTIC ACID, PLASMA: Lactic Acid, Venous: 0.8 mmol/L (ref 0.5–1.9)

## 2018-12-19 LAB — CBC
HCT: 27.3 % — ABNORMAL LOW (ref 36.0–46.0)
Hemoglobin: 8.9 g/dL — ABNORMAL LOW (ref 12.0–15.0)
MCH: 24.3 pg — ABNORMAL LOW (ref 26.0–34.0)
MCHC: 32.6 g/dL (ref 30.0–36.0)
MCV: 74.6 fL — ABNORMAL LOW (ref 80.0–100.0)
Platelets: 210 10*3/uL (ref 150–400)
RBC: 3.66 MIL/uL — ABNORMAL LOW (ref 3.87–5.11)
RDW: 19.9 % — ABNORMAL HIGH (ref 11.5–15.5)
WBC: 10 10*3/uL (ref 4.0–10.5)
nRBC: 0 % (ref 0.0–0.2)

## 2018-12-19 LAB — HEMOGLOBIN A1C
Hgb A1c MFr Bld: 4.6 % — ABNORMAL LOW (ref 4.8–5.6)
Mean Plasma Glucose: 85.32 mg/dL

## 2018-12-19 MED ORDER — OXYCODONE-ACETAMINOPHEN 5-325 MG PO TABS: 1.0000 | ORAL_TABLET | ORAL | Status: DC | PRN

## 2018-12-19 MED ORDER — HEPARIN SODIUM (PORCINE) 1000 UNIT/ML IJ SOLN
INTRAMUSCULAR | Status: AC
  Administered 2018-12-20: 3000 [IU] via INTRAVENOUS_CENTRAL
  Filled 2018-12-19: qty 1

## 2018-12-19 MED ORDER — CHLORHEXIDINE GLUCONATE CLOTH 2 % EX PADS
6.0000 | MEDICATED_PAD | Freq: Every day | CUTANEOUS | Status: DC
  Administered 2018-12-20 – 2018-12-22 (×3): 6 via TOPICAL

## 2018-12-19 MED ORDER — MORPHINE SULFATE (PF) 2 MG/ML IV SOLN
1.0000 mg | INTRAVENOUS | Status: DC | PRN
  Administered 2018-12-19 – 2018-12-22 (×8): 1 mg via INTRAVENOUS
  Filled 2018-12-19 (×8): qty 1

## 2018-12-19 MED ORDER — INSULIN ASPART 100 UNIT/ML ~~LOC~~ SOLN
0.0000 [IU] | Freq: Three times a day (TID) | SUBCUTANEOUS | Status: DC
  Administered 2018-12-20: 2 [IU] via SUBCUTANEOUS
  Administered 2018-12-21 (×2): 1 [IU] via SUBCUTANEOUS
  Administered 2018-12-22: 2 [IU] via SUBCUTANEOUS

## 2018-12-19 MED ORDER — HEPARIN SODIUM (PORCINE) 1000 UNIT/ML IJ SOLN
INTRAMUSCULAR | Status: AC
  Filled 2018-12-19: qty 4

## 2018-12-19 MED ORDER — HYDRALAZINE HCL 20 MG/ML IJ SOLN
5.0000 mg | INTRAMUSCULAR | Status: DC | PRN
  Administered 2018-12-19: 5 mg via INTRAVENOUS
  Filled 2018-12-19: qty 1

## 2018-12-19 MED ORDER — DARBEPOETIN ALFA 60 MCG/0.3ML IJ SOSY
60.0000 ug | PREFILLED_SYRINGE | Freq: Once | INTRAMUSCULAR | Status: DC
  Filled 2018-12-19: qty 0.3

## 2018-12-19 MED ORDER — DOXERCALCIFEROL 4 MCG/2ML IV SOLN
4.0000 ug | INTRAVENOUS | Status: DC
  Administered 2018-12-20: 11:00:00 4 ug via INTRAVENOUS
  Filled 2018-12-19 (×2): qty 2

## 2018-12-19 NOTE — Progress Notes (Addendum)
[facility] PROGRESS NOTE  Rhonda Lynch QAS:[id number] DOB: 07-Feb-1956 DOA: 12/18/2018 PCP: Clent Demark, PA-C  Assessment/Plan:  Acute intractable nausea, vomiting, diarrhea, likely multifactorial etiology. Sleeping soundly this am. Requesting breakfast. Recently admitted  with C. difficile colitis but has been noncompliant with vancomycin dosing. Also non compliant with dialysis has missed last 2 sessions. No diarrhea. Abdominal xray with no bowel obstruction or free air. Some opacity concerning for multifocal pneumonia. No fever, no leukocytosis.  -continue oral vanc -Continue PRN Zofran -monitor intake and output -monitor oxygen saturation level -consider repeating xray after dialysis and/or prior to discharge. Hx of SARS CoV-2 negative  Acute respiratory failure with hypoxia. Likely related to 2 missed dialysis sessions in setting of recent concern for HCAP treated with pip/tazo. Abdominal xray as noted above. Max temp 99 oxygen sat 87% after morphine and fentanyl.  -oxygen supplementation -dialysis -monitor -suspicion for infectious process low -incentive spirometry -mobilize  Hyperkalemia related to missed dialysis. Mild, no ekg changes -follow after dialysis    End-stage renal disease, dialysis Tuesday Thursday Saturday Patient remarkably noncompliant, has missed at least 2 if not more dialysis sessions at this point. Creatinine greater than 15.  -dialysis today per nephrology -may need tomorrow per regular schedule as well -of note left upper arm AV gore tex graft occluded s/p thrombectomy and revision 12/11/18   Remarkable history of medication/lifestyle noncompliance -counseling offered  Recent diagnosis of C. difficile colitis, POA, resolving - Continue vancomycin p.o. as above unclear stop date per recent discharge  -continue for additional 48 hours follow symptomatically - no stool since admission  Chronic anemia of chronic disease,  stable -Baseline  Anion gap metabolic acidosis in the setting of uremia as above no dka. -In the setting of uremia  - follow after dialysis  Insulin dependent diabetes type 2, poorly controlled  A1c 6.1 in November 2019. -obtain A1c -Continue home insulin regimen -Continue SSI/Hypoglycemia protocol  Polypharmacy/illicit substance abuse -Patient unable/unwilling to provide urine for UDS -Known history of cocaine abuse -Lengthy discussion about need for cessation given elevated risk of morbidity and mortality  Hypertension. BP high end of normal. Likely related to missed dialysis. Home meds include norvasc -continue norvasc -dialysis per nephrology -amlodipine prn   Code Status: full Family Communication: none present Disposition Plan: home hopefully tomorrow after dialysis if needed   Consultants:  Dr. 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 She has missed last 2 dialysis sessions. Recently discharged on po vanc and has been non compliant.  Eyes closed arouses to mild touch. Complains abdominal pain and requesting breakfast.  Objective: Vitals:   12/19/18 0044 12/19/18 0456  BP:  (!) 154/94  Pulse: (!) 113 (!) 110  Resp:  19  Temp:  98.6 F (37 C)  SpO2: 92% 90%    Intake/Output Summary (Last 24 hours) at 12/19/2018 0901 Last data filed at 12/19/2018 0528 Gross per 24 hour  Intake 240 ml  Output 3 ml  Net 237 ml   Filed Weights   12/19/18 0038  Weight: 54.3 kg    Exam:   General:  Eyes closed will arouse to touch resting comfortably  Cardiovascular: tachycardia regular no mgr no le edema  Respiratory: normal effort while asleep, mild increased work of breathing  when awake. BS distant but clear no crackles or wheezing  Abdomen: obese soft +BS no guarding or rebounding  Musculoskeletal: joints without swelling/erythema   Data Reviewed: Basic Metabolic Panel: Recent Labs  Lab 12/18/18 1838 12/19/18 0031  NA 137 135  K 5.6* 5.5*  CL 102 101  CO2 17* 16*  GLUCOSE 104* 87  BUN 78* 81*  CREATININE 15.10* 15.60*  CALCIUM 7.8* 7.5*   Liver Function Tests: Recent Labs  Lab 12/18/18 1838  AST 26  ALT 15  ALKPHOS 87  BILITOT 0.6  PROT 7.5  ALBUMIN 2.8*   Recent Labs  Lab 12/18/18 1838  LIPASE 55*   No results for input(s): AMMONIA in the last 168 hours. CBC: Recent Labs  Lab 12/18/18 1838 12/19/18 0031  WBC 9.7 10.0  NEUTROABS 6.6  --   HGB 10.0* 8.9*  HCT 32.8* 27.3*  MCV 76.8* 74.6*  PLT 235 210   Cardiac Enzymes: No results for input(s): CKTOTAL, CKMB, CKMBINDEX, TROPONINI in the last 168 hours. BNP (last 3 results) Recent Labs    02/12/18 1534 06/20/18 2042 12/03/18 1209  BNP 225.7* >4,500.0* >4,500.0*    ProBNP (last 3 results) No results for input(s): PROBNP in the last 8760 hours.  CBG: Recent Labs  Lab 12/12/18 1125 12/19/18 0040 12/19/18 0504  GLUCAP 161* 84 94    No results found for this or any previous visit (from the past 240 hour(s)).   Studies: Dg Abdomen Acute W/chest  Result Date: 12/18/2018 CLINICAL DATA:  Abdominal pain and vomiting EXAM: DG ABDOMEN ACUTE W/ 1V CHEST COMPARISON:  Chest radiograph December 09, 2018. FINDINGS: PA chest: There is patchy airspace opacity throughout both lungs, slightly more on the left than on the right. There is cardiomegaly with pulmonary vascularity within normal limits. No adenopathy. There is aortic atherosclerosis. Central catheter tip is in the superior vena cava. Supine and upright abdomen: There is no bowel dilatation or air-fluid level to suggest bowel obstruction. No free air. There are surgical clips in the gallbladder fossa region. There are foci of  vascular calcification in the pelvis. IMPRESSION: Airspace opacity throughout both lungs, slightly more on the left than on the right. Suspect multifocal pneumonia. Atypical organism pneumonia may present in this manner. There is cardiomegaly. Aortic Atherosclerosis (ICD10-I70.0). No bowel obstruction or free air evident. Surgical clips in right upper quadrant. Electronically Signed   By: Lowella Grip III M.D.   On: 12/18/2018 19:44    Scheduled Meds: . amLODipine  5 mg Oral QHS  . fluticasone  2 spray Each Nare Daily  . insulin aspart  5 Units Subcutaneous TID WC  . insulin glargine  3 Units Subcutaneous QHS  . ondansetron (ZOFRAN) IV  4 mg Intravenous Q6H  . pantoprazole  40 mg Oral Daily  . sevelamer carbonate  1,600 mg Oral TID WC  . vancomycin  125 mg Oral TID AC & HS   Continuous Infusions:  Principal Problem:   Hyperkalemia Active Problems:   End-stage renal disease on hemodialysis (HCC)   Metabolic acidosis, increased anion gap ([facility])   Cocaine abuse (HCC)   Epigastric pain   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[place])   Non-compliance with renal dialysis ([place])   Type 2 diabetes mellitus ([place])   Substance induced mood disorder ([place])    Time spent: 91 minutes    [place] NP  [facility]  If 7PM-7AM, please contact night-coverage at [email or web address], password [facility] 12/19/2018, 9:01 AM  LOS: 0 days

## 2018-12-19 NOTE — Consult Note (Addendum)
[facility] Renal Consultation Note    Indication for Consultation:  Management of ESRD/hemodialysis; anemia, hypertension/volume and secondary hyperparathyroidism PCP: Link Snuffer PAC  HPI: Rhonda Lynch is a 63 y.o. female with ESRD on hemodialysis T,Th,S at [facility]. PMH: Polysubstance abuse, marked noncompliance with HD prescription, MPO ANCA RPGN  DM, HTN, CVA, C diff colitis-non-complaint with oral vanc, SHPT, AOCD. Recent adm [facility] 03/25-04/11/2018. No HD since in-patient HD 12/10/2018. Patient presented to HD yesterday but left center before starting HD because she couldn't find her purse. Consequently went to ED with C/O nausea, vomiting and diarrhea.   Upon arrival to ED, she was afebrile, T 98.4 HR 103 BP 125/69. She was noted to have mild hyperkalemia-K+ 5.6 SCr 15.1 BUN 78 Co2 17 HGB 8.9 WBC 10.0, lactic acid 1.0. She has been admitted for intractable N & V in setting of non-compliance with HD and non-compliance with ABX for C diff colitis.   Seen in room. Sitting up in chair, denies N &V. Seems very anxious, scratchy, hands are shaky. She vehemently denies recent cocaine use however has refused UDA. Denies SOB. Mild asterix present.   Past Medical History:  Diagnosis Date  . Diabetes mellitus without complication ([place])   . ESRD (end stage renal disease) on dialysis ([place])    "TTS; [place]; they're moving me to another one" (09/22/2018)  . Hepatitis C    diagnosed 09/2018  . Hypertension   . Oxygen deficiency    2 L at night  . Stroke ([place])   . Substance abuse ([place])    has been clean for 4 months    Past Surgical History:  Procedure Laterality Date  . AV FISTULA PLACEMENT Left 03/05/2018   Procedure: ARTERIOVENOUS (AV) FISTULA CREATION  LEFT UPPER EXTREMITY;  Surgeon: [name], MD;  Location: [place];  Service: Vascular;  Laterality: Left;  . AV FISTULA PLACEMENT Left 07/21/2018   Procedure: ARTERIOVENOUS (AV) FISTULA CREATION;   Surgeon: Marty Heck, MD;  Location: [employer];  Service: Vascular;  Laterality: Left;  . AV FISTULA PLACEMENT Left 10/27/2018   Procedure: INSERTION OF ARTERIOVENOUS (AV) GORE-TEX GRAFT UPPER ARM;  Surgeon: Marty Heck, MD;  Location: [place];  Service: Vascular;  Laterality: Left;  . BASCILIC VEIN TRANSPOSITION Left 09/29/2018   Procedure: BASILIC VEIN TRANSPOSITION SECOND STAGE LEFT UPPER ARM;  Surgeon: Marty Heck, MD;  Location: [place];  Service: Vascular;  Laterality: Left;  . HEMATOMA EVACUATION Left 03/06/2018   Procedure: EVACUATION HEMATOMA LEFT ARM;  Surgeon: Angelia Mould, MD;  Location: [place];  Service: Vascular;  Laterality: Left;  . I&D EXTREMITY Left 10/06/2018   Procedure: IRRIGATION AND DEBRIDEMENT ARM;  Surgeon: Marty Heck, MD;  Location: [place];  Service: Vascular;  Laterality: Left;  . INSERTION OF DIALYSIS CATHETER Left 04/29/2018   Procedure: INSERTION OF DIALYSIS CATHETER LEFT INTERNAL JUGULAR;  Surgeon: Angelia Mould, MD;  Location: [place];  Service: Vascular;  Laterality: Left;  . IR FLUORO GUIDE CV LINE RIGHT  02/18/2018  . IR FLUORO GUIDE CV LINE RIGHT  02/26/2018  . IR REMOVAL TUN CV CATH W/O FL  04/27/2018  . IR US GUIDE VASC ACCESS RIGHT  02/18/2018  . TEE WITHOUT CARDIOVERSION N/A 04/29/2018   Procedure: TRANSESOPHAGEAL ECHOCARDIOGRAM (TEE);  Surgeon: Sueanne Margarita, MD;  Location: [place];  Service: Cardiovascular;  Laterality: N/A;  . THROMBECTOMY AND REVISION OF ARTERIOVENTOUS (AV) GORETEX  GRAFT Left 12/11/2018  Procedure: THROMBECTOMY AND  REVISION  ARTERIOVENTOUS GORETEX  GRAFT LEFT ARM;  Surgeon: [name], MD;  Location: [place];  Service: Vascular;  Laterality: Left;  Marland Kitchen VENOGRAM Left 10/27/2018   Procedure: LEFT CENTRAL VENOGRAM;  Surgeon: Marty Heck, MD;  Location: [facility];  Service: Vascular;  Laterality: Left;   Family History  Problem Relation Age of Onset  . Autoimmune disease Neg Hx    Social  History:  reports that she has been smoking cigarettes. She has a 24.00 pack-year smoking history. She has never used smokeless tobacco. She reports previous alcohol use. She reports current drug use. Drugs: Cocaine and Heroin. Allergies  Allergen Reactions  . Acetaminophen Nausea And Vomiting    Patient states Acetaminophen and Acetaminophen containing products make her nauseated. She demonstrated this 06/21/18 with nausea followed by emesis.  Marland Kitchen Hydroxyzine Itching   Prior to Admission medications   Medication Sig Start Date End Date Taking? Authorizing Provider  acetaminophen (TYLENOL) 500 MG tablet Take 1,000 mg by mouth every 6 (six) hours as needed for mild pain.   Yes [provider]  amLODipine (NORVASC) 5 MG tablet Take 1 tablet (5 mg total) by mouth at bedtime. 10/13/18  Yes Alphonzo Grieve, MD  B Complex-C-Zn-Folic Acid (DIALYVITE 567-OLID 15) 0.8 MG TABS Take 1 tablet by mouth daily. 11/04/18  Yes [provider]  calcitRIOL (ROCALTROL) 0.25 MCG capsule Take 1 capsule (0.25 mcg total) by mouth every Monday, Wednesday, and Friday with hemodialysis. 10/29/18  Yes Isabelle Course, MD  diphenhydrAMINE (BENADRYL) 2 % cream Apply 1 application topically 3 (three) times daily as needed for itching.   Yes [provider]  fluticasone (FLONASE) 50 MCG/ACT nasal spray Place 2 sprays into both nostrils daily.   Yes [provider]  insulin aspart (NOVOLOG) 100 UNIT/ML injection Inject 5 Units into the skin 3 (three) times daily with meals. Patient taking differently: Inject 0-9 Units into the skin 3 (three) times daily with meals.  10/13/18  Yes Alphonzo Grieve, MD  insulin glargine (LANTUS) 100 UNIT/ML injection Inject 0.03 mLs (3 Units total) into the skin at bedtime. Patient taking differently: Inject 5 Units into the skin at bedtime.  10/29/18  Yes Isabelle Course, MD  pantoprazole (PROTONIX) 40 MG tablet Take 1 tablet (40 mg total) by mouth daily. 11/21/18  Yes  Ghimire, Henreitta Leber, MD  sevelamer carbonate (RENVELA) 800 MG tablet Take 2 tablets (1,600 mg total) by mouth 3 (three) times daily with meals. 11/26/18  Yes Dana Allan I, MD  Blood Glucose Monitoring Suppl (ACCU-CHEK AVIVA) device Use as instructed 10/29/18 10/29/19  Isabelle Course, MD  glucose blood (ACCU-CHEK AVIVA) test strip Use twice per day. 10/29/18   Isabelle Course, MD  Insulin Syringe-Needle U-100 (INSULIN SYRINGE .3CC/31GX5/16") 31G X 5/16" 0.3 ML MISC Inject 1 each into the skin 4 (four) times daily. 06/27/18   Clent Demark, PA-C  Lancets (ACCU-CHEK SOFT [facility]) lancets Use twice per day. 10/29/18   Isabelle Course, MD  vancomycin (VANCOCIN) 125 MG capsule Take 1 capsule (125 mg total) by mouth 4 (four) times daily. Patient not taking: Reported on 12/18/2018 12/12/18   Caren Griffins, MD   Current Facility-Administered Medications  Medication Dose Route Frequency Provider Last Rate Last Dose  . acetaminophen (TYLENOL) tablet 1,000 mg  1,000 mg Oral Q6H PRN Little Ishikawa, MD      . amLODipine (NORVASC) tablet 5 mg  5 mg Oral QHS Holli Humbles  C, MD   5 mg at 12/19/18 0017  . diphenhydrAMINE (BENADRYL) capsule 25 mg  25 mg Oral Q8H PRN Little Ishikawa, MD   25 mg at 12/19/18 4656  . fluticasone (FLONASE) 50 MCG/ACT nasal spray 2 spray  2 spray Each Nare Daily Little Ishikawa, MD      . hydrALAZINE (APRESOLINE) injection 5 mg  5 mg Intravenous Q4H PRN Radene Gunning, NP   5 mg at 12/19/18 1006  . insulin aspart (novoLOG) injection 5 Units  5 Units Subcutaneous TID WC Little Ishikawa, MD   5 Units at 12/19/18 [phone]  . insulin glargine (LANTUS) injection 3 Units  3 Units Subcutaneous QHS Little Ishikawa, MD   3 Units at 12/19/18 [phone]  . morphine 2 MG/ML injection 1 mg  1 mg Intravenous Q3H PRN Radene Gunning, NP   1 mg at 12/19/18 1007  . ondansetron (ZOFRAN) injection 4 mg  4 mg Intravenous Q6H Little Ishikawa, MD   4 mg at 12/19/18 0601  .  pantoprazole (PROTONIX) EC tablet 40 mg  40 mg Oral Daily Little Ishikawa, MD   40 mg at 12/19/18 0174  . sevelamer carbonate (RENVELA) tablet 1,600 mg  1,600 mg Oral TID WC Little Ishikawa, MD   1,600 mg at 12/19/18 0925  . vancomycin (VANCOCIN) 50 mg/mL oral solution 125 mg  125 mg Oral TID AC & HS Little Ishikawa, MD   125 mg at 12/19/18 9449   Labs: Basic Metabolic Panel: Recent Labs  Lab 12/18/18 1838 12/19/18 0031  NA 137 135  K 5.6* 5.5*  CL 102 101  CO2 17* 16*  GLUCOSE 104* 87  BUN 78* 81*  CREATININE 15.10* 15.60*  CALCIUM 7.8* 7.5*   Liver Function Tests: Recent Labs  Lab 12/18/18 1838  AST 26  ALT 15  ALKPHOS 87  BILITOT 0.6  PROT 7.5  ALBUMIN 2.8*   Recent Labs  Lab 12/18/18 1838  LIPASE 55*   No results for input(s): AMMONIA in the last 168 hours. CBC: Recent Labs  Lab 12/18/18 1838 12/19/18 0031  WBC 9.7 10.0  NEUTROABS 6.6  --   HGB 10.0* 8.9*  HCT 32.8* 27.3*  MCV 76.8* 74.6*  PLT 235 210   Cardiac Enzymes: No results for input(s): CKTOTAL, CKMB, CKMBINDEX, TROPONINI in the last 168 hours. CBG: Recent Labs  Lab 12/12/18 1125 12/19/18 0040 12/19/18 0504  GLUCAP 161* 84 94   Iron Studies: No results for input(s): IRON, TIBC, TRANSFERRIN, FERRITIN in the last 72 hours. Studies/Results: Dg Abdomen Acute W/chest  Result Date: 12/18/2018 CLINICAL DATA:  Abdominal pain and vomiting EXAM: DG ABDOMEN ACUTE W/ 1V CHEST COMPARISON:  Chest radiograph December 09, 2018. FINDINGS: PA chest: There is patchy airspace opacity throughout both lungs, slightly more on the left than on the right. There is cardiomegaly with pulmonary vascularity within normal limits. No adenopathy. There is aortic atherosclerosis. Central catheter tip is in the superior vena cava. Supine and upright abdomen: There is no bowel dilatation or air-fluid level to suggest bowel obstruction. No free air. There are surgical clips in the gallbladder fossa region. There are  foci of vascular calcification in the pelvis. IMPRESSION: Airspace opacity throughout both lungs, slightly more on the left than on the right. Suspect multifocal pneumonia. Atypical organism pneumonia may present in this manner. There is cardiomegaly. Aortic Atherosclerosis (ICD10-I70.0). No bowel obstruction or free air evident. Surgical clips in right upper quadrant. Electronically Signed  By: Lowella Grip III M.D.   On: 12/18/2018 19:44    ROS: As per HPI otherwise negative.   Physical Exam: Vitals:   12/19/18 0044 12/19/18 0456 12/19/18 0905 12/19/18 1009  BP:  (!) 154/94 (!) 165/90   Pulse: (!) 113 (!) 110 (!) 108   Resp:  19 18   Temp:  98.6 F (37 C) 99 F (37.2 C)   TempSrc:  Oral Oral   SpO2: 92% 90% (!) 87% 97%  Weight:      Height:         General: Chronically ill appearing female in NAD. Head: Normocephalic, atraumatic, sclera non-icteric, mucus membranes are moist Neck: Supple. JVD not elevated. Lungs: Clear bilaterally to auscultation without wheezes, rales, or rhonchi. Slightly decreased in bases. Breathing is unlabored. Heart: RRR with S1 S2. No murmurs, rubs, or gallops appreciated. Abdomen: Soft, non-tender, non-distended with normoactive bowel sounds. No rebound/guarding. No obvious abdominal masses. M-S:  Strength and tone appear normal for age. Lower extremities:without edema or ischemic changes, no open wounds  Neuro: Alert and oriented X 3. Moves all extremities spontaneously. Mild asterixis present Psych:  Appears anxious, slow to respond to questions.  Dialysis Access: L AVG + bruit. RIJ TDC drsg soiled, she is scratching site.   Dialysis Orders: [place] T,Th,S 4 hrs 180NRE 400/800 55 kg 2.0 K/ 2.5 Ca  L AVG/RIJ TDC -Heparin 3000 units IV TIW -Hectorol 4 mcg IV TIW -Mircera 100 mcg IV q 2 weeks (last dose 150 mcg IV 11/29/18 Last HGB 10.9 11/29/18)  Assessment/Plan: 1.  Acute hypoxic Respiratory failure-Xray concerning for MF PNA. Recent negative  SARS Cov-2 test. No ABX yet.  2. Intractible N & V, D. Probably combination of uremia from missed HD and non-compliance with ABX for C-diff colitis. Restarted oral vanc. Per primary.  3. Uremia-hasn't been to HD since last discharged from hospital. BUN ^ 81 SCr 15.6 Co2 16. Will have HD today and again tomorrow.  4.  ESRD -  T,Th,S. Non-compliant with HD Rx. HD schedule as noted above. K+ 5.5. Usual bath and heparin. Had started using AVG last adm.  5.  Hypertension/volume  - Wt 54.3 kg actually under OP EDW. UFG 2.5-3 liters. BP elevated, antihypertensive meds resumed.  6.  Anemia  - HGB 8.9. No recent ESA. Give Aranesp with HD today.  7.  Metabolic bone disease -  Check RFP with HD today. Ca 7.5 C Ca 8.5 Continue VDRA. No binders on OP med list. Continue renvela.   8.  Nutrition -Renal/Carb mod diet. Albumin 2.8. Add prostat, renal vits 9.  DM-per primary. Hypoglycemia this AM. BS 43. Per primary.  Marquel Spoto H. Owens Shark, NP-C 12/19/2018, 11:12 AM  [employer] [phone]

## 2018-12-19 NOTE — Progress Notes (Signed)
Patient oxygen saturation on room air was 87%. Patient placed on 2L of oxygen, placed upright in chair, and given incentive spirometer using teach back method per MD orders.. Patient now saturating 97% on 2L of oxygen. Will continue to monitor.

## 2018-12-19 NOTE — Progress Notes (Signed)
CRITICAL VALUE ALERT  Critical Value: CBG:47  Date & Time Notied: 12/19/18 1130  Provider Notified: Tamala Julian, MD  Orders Received/Actions taken: 8 ounces of apple juice given. CBG re-checked and now 73. No hypoglycemic symptoms assessed or reported by patient. MD notified. Meal tray arriving to unit shortly. Will continue to monitor.

## 2018-12-20 ENCOUNTER — Inpatient Hospital Stay (HOSPITAL_COMMUNITY): Payer: Medicare Other

## 2018-12-20 DIAGNOSIS — E872 Acidosis: Secondary | ICD-10-CM

## 2018-12-20 LAB — GLUCOSE, CAPILLARY
Glucose-Capillary: 129 mg/dL — ABNORMAL HIGH (ref 70–99)
Glucose-Capillary: 115 mg/dL — ABNORMAL HIGH (ref 70–99)
Glucose-Capillary: 172 mg/dL — ABNORMAL HIGH (ref 70–99)
Glucose-Capillary: 123 mg/dL — ABNORMAL HIGH (ref 70–99)

## 2018-12-20 LAB — CBC
HCT: 28.9 % — ABNORMAL LOW (ref 36.0–46.0)
Hemoglobin: 9.3 g/dL — ABNORMAL LOW (ref 12.0–15.0)
MCH: 24 pg — ABNORMAL LOW (ref 26.0–34.0)
MCHC: 32.2 g/dL (ref 30.0–36.0)
MCV: 74.5 fL — ABNORMAL LOW (ref 80.0–100.0)
Platelets: 187 10*3/uL (ref 150–400)
RBC: 3.88 MIL/uL (ref 3.87–5.11)
RDW: 19 % — ABNORMAL HIGH (ref 11.5–15.5)
WBC: 10.7 10*3/uL — ABNORMAL HIGH (ref 4.0–10.5)
nRBC: 0 % (ref 0.0–0.2)

## 2018-12-20 MED ORDER — PENTAFLUOROPROP-TETRAFLUOROETH EX AERO: 1.0000 "application " | INHALATION_SPRAY | CUTANEOUS | Status: DC | PRN

## 2018-12-20 MED ORDER — HEPARIN SODIUM (PORCINE) 1000 UNIT/ML IJ SOLN
INTRAMUSCULAR | Status: AC
  Filled 2018-12-20: qty 3

## 2018-12-20 MED ORDER — HEPARIN SODIUM (PORCINE) 5000 UNIT/ML IJ SOLN
5000.0000 [IU] | Freq: Three times a day (TID) | INTRAMUSCULAR | Status: DC
  Administered 2018-12-20 – 2018-12-21 (×5): 5000 [IU] via SUBCUTANEOUS
  Filled 2018-12-20 (×4): qty 1

## 2018-12-20 MED ORDER — MORPHINE SULFATE (PF) 4 MG/ML IV SOLN
INTRAVENOUS | Status: AC
  Administered 2018-12-20: 1 mg
  Filled 2018-12-20: qty 1

## 2018-12-20 MED ORDER — ALTEPLASE 2 MG IJ SOLR: 2.0000 mg | Freq: Once | INTRAMUSCULAR | Status: DC | PRN

## 2018-12-20 MED ORDER — MORPHINE SULFATE (PF) 4 MG/ML IV SOLN
INTRAVENOUS | Status: AC
  Filled 2018-12-20: qty 1

## 2018-12-20 MED ORDER — DARBEPOETIN ALFA 100 MCG/0.5ML IJ SOSY
PREFILLED_SYRINGE | INTRAMUSCULAR | Status: AC
  Administered 2018-12-20: 100 ug via INTRAVENOUS
  Filled 2018-12-20: qty 0.5

## 2018-12-20 MED ORDER — HEPARIN SODIUM (PORCINE) 1000 UNIT/ML DIALYSIS: 1000.0000 [IU] | INTRAMUSCULAR | Status: DC | PRN

## 2018-12-20 MED ORDER — SEVELAMER CARBONATE 800 MG PO TABS
2400.0000 mg | ORAL_TABLET | Freq: Three times a day (TID) | ORAL | Status: DC
  Administered 2018-12-21: 2400 mg via ORAL
  Filled 2018-12-20: qty 3

## 2018-12-20 MED ORDER — DOXERCALCIFEROL 4 MCG/2ML IV SOLN
INTRAVENOUS | Status: AC
  Administered 2018-12-20: 4 ug via INTRAVENOUS
  Filled 2018-12-20: qty 2

## 2018-12-20 MED ORDER — HEPARIN SODIUM (PORCINE) 1000 UNIT/ML DIALYSIS
3000.0000 [IU] | Freq: Once | INTRAMUSCULAR | Status: AC
  Administered 2018-12-20: 08:00:00 3000 [IU] via INTRAVENOUS_CENTRAL

## 2018-12-20 MED ORDER — LIDOCAINE-PRILOCAINE 2.5-2.5 % EX CREA: 1.0000 "application " | TOPICAL_CREAM | CUTANEOUS | Status: DC | PRN

## 2018-12-20 MED ORDER — SODIUM CHLORIDE 0.9 % IV SOLN: 100.0000 mL | INTRAVENOUS | Status: DC | PRN

## 2018-12-20 MED ORDER — LIDOCAINE HCL (PF) 1 % IJ SOLN: 5.0000 mL | INTRAMUSCULAR | Status: DC | PRN

## 2018-12-20 MED ORDER — DARBEPOETIN ALFA 100 MCG/0.5ML IJ SOSY
100.0000 ug | PREFILLED_SYRINGE | INTRAMUSCULAR | Status: DC
  Administered 2018-12-20: 11:00:00 100 ug via INTRAVENOUS

## 2018-12-20 MED ORDER — ONDANSETRON HCL 4 MG/2ML IJ SOLN: 4.0000 mg | Freq: Four times a day (QID) | INTRAMUSCULAR | Status: DC | PRN

## 2018-12-20 NOTE — Progress Notes (Signed)
[facility] Progress Note   Dialysis Orders: [place] T,Th,S 4 hrs 180NRE 400/800 55 kg 2.0 K/ 2.5 Ca  L AVG/RIJ TDC -Heparin 3000 units IV TIW -Hectorol 4 mcg IV TIW -Mircera 100 mcg IV q 2 weeks (last dose 150 mcg IV 11/29/18 Last HGB 10.9 11/29/18)  Assessment/Plan: 1. Acute hypoxic Respiratory failure-Xray concerning for MF PNA. Recent negative SARS Cov-2 test. No ABX yet.  2. Intractible N & V, D. Probably combination of uremia from missed HD and non-compliance with ABX for C-diff colitis. Restarted oral vanc. Per primary.  3. Uremia-hasn't been to HD since last discharged from hospital. BUN ^ 81 SCr 15.6 Co2 16. Will have HD today and again tomorrow.  4.  ESRD -  T,Th,S. Non-compliant with HD Rx. HD schedule as noted above. K+ 5.5. Usual bath and heparin. Had started using AVG last adm. Should be able to get catheter out this admission - will arrange.next week- if she continues to do well. AVGG required declot last admission  5.  Hypertension/volume  - Wt 54.3 kg actually under OP EDW. UFG 2.5-3 liters. BP elevated, antihypertensive meds resumed. net UF 3 L Friday to 50.8 - below edw - needs standing post wt - this is significantly below prior EDW  6.  Anemia  - HGB 8.9 > 9.3 . ESA not given as ordered Friday - will give today. Give Aranesp 100 7.  Metabolic bone disease -  P still high - increase renvela to 3 ac Continue VDRA. No binders on OP med list. Continue renvela.   8.  Nutrition -Renal/Carb mod diet. Albumin 2.8. Add prostat, renal vits 9.  DM-per primary 10.  Noncompliance with dialysis - had not been to dialysis since d/c until she went the day of admission then left because  "she couldn't find her purse/" 11. Disp - has unstable social situation/substance use adding to medical compliance challenges.  Myriam Jacobson, PA-C [facility] Beeper [phone] 12/20/2018,8:35 AM  LOS: 1 day   Subjective:   C/o diarreha  Objective Vitals:   12/19/18  1936 12/19/18 2024 12/19/18 2026 12/20/18 0416  BP: (!) 154/68 (!) 141/82  125/78  Pulse: (!) 122  (!) 125   Resp: (!) 28 19  18   Temp: 99.3 F (37.4 C) 98.6 F (37 C)  99.2 F (37.3 C)  TempSrc: Oral Oral  Oral  SpO2: 100%  92%   Weight: 50.8 kg     Height:       Physical Exam General: ill appearing cough at times Heart: RRR Lungs: clears with cough Abdomen: soft NT Extremities: no LE edema Dialysis Access: left AVGG and right IJ [facility]   Additional Objective Labs: Basic Metabolic Panel: Recent Labs  Lab 12/18/18 1838 12/19/18 0031 12/20/18 0304  NA 137 135 136  K 5.6* 5.5* 4.7  CL 102 101 98  CO2 17* 16* 23  GLUCOSE 104* 87 91  BUN 78* 81* 22  CREATININE 15.10* 15.60* 7.02*  CALCIUM 7.8* 7.5* 8.0*   Liver Function Tests: Recent Labs  Lab 12/18/18 1838  AST 26  ALT 15  ALKPHOS 87  BILITOT 0.6  PROT 7.5  ALBUMIN 2.8*   Recent Labs  Lab 12/18/18 1838  LIPASE 55*   CBC: Recent Labs  Lab 12/18/18 1838 12/19/18 0031 12/20/18 0304  WBC 9.7 10.0 10.7*  NEUTROABS 6.6  --   --   HGB 10.0* 8.9* 9.3*  HCT 32.8* 27.3* 28.9*  MCV 76.8* 74.6* 74.5*  PLT  235 210 187   Blood Culture    Component Value Date/Time   SDES BLOOD RIGHT WRIST 12/03/2018 1213   SPECREQUEST  12/03/2018 1213    BOTTLES DRAWN AEROBIC AND ANAEROBIC Blood Culture results may not be optimal due to an inadequate volume of blood received in culture bottles   CULT  12/03/2018 1213    NO GROWTH 5 DAYS Performed at [facility], [place] [address], [place], [place] [address]    REPTSTATUS 12/08/2018 FINAL 12/03/2018 1213    Cardiac Enzymes: No results for input(s): CKTOTAL, CKMB, CKMBINDEX, TROPONINI in the last 168 hours. CBG: Recent Labs  Lab 12/19/18 0504 12/19/18 1120 12/19/18 1147 12/19/18 2028 12/20/18 0421  GLUCAP 94 47* 73 200* 129*   Iron Studies: No results for input(s): IRON, TIBC, TRANSFERRIN, FERRITIN in the last 72 hours. Lab Results  Component Value Date    INR 0.94 10/27/2018   INR 0.96 09/29/2018   INR 0.97 07/23/2018   Studies/Results: Dg Abdomen Acute W/chest  Result Date: 12/18/2018 CLINICAL DATA:  Abdominal pain and vomiting EXAM: DG ABDOMEN ACUTE W/ 1V CHEST COMPARISON:  Chest radiograph December 09, 2018. FINDINGS: PA chest: There is patchy airspace opacity throughout both lungs, slightly more on the left than on the right. There is cardiomegaly with pulmonary vascularity within normal limits. No adenopathy. There is aortic atherosclerosis. Central catheter tip is in the superior vena cava. Supine and upright abdomen: There is no bowel dilatation or air-fluid level to suggest bowel obstruction. No free air. There are surgical clips in the gallbladder fossa region. There are foci of vascular calcification in the pelvis. IMPRESSION: Airspace opacity throughout both lungs, slightly more on the left than on the right. Suspect multifocal pneumonia. Atypical organism pneumonia may present in this manner. There is cardiomegaly. Aortic Atherosclerosis (ICD10-I70.0). No bowel obstruction or free air evident. Surgical clips in right upper quadrant. Electronically Signed   By: Lowella Grip III M.D.   On: 12/18/2018 19:44   Medications: . sodium chloride    . sodium chloride     . amLODipine  5 mg Oral QHS  . Chlorhexidine Gluconate Cloth  6 each Topical Q0600  . darbepoetin (ARANESP) injection - DIALYSIS  60 mcg Intravenous Once  . doxercalciferol  4 mcg Intravenous Q T,Th,Sa-HD  . fluticasone  2 spray Each Nare Daily  . heparin      . insulin aspart  0-9 Units Subcutaneous TID WC  . insulin glargine  3 Units Subcutaneous QHS  . morphine      . pantoprazole  40 mg Oral Daily  . sevelamer carbonate  1,600 mg Oral TID WC  . vancomycin  125 mg Oral TID AC & HS

## 2018-12-20 NOTE — Progress Notes (Signed)
Progress Note    Rhonda Lynch  [id number]:[id number] DOB: 1955/11/12  DOA: 12/18/2018 PCP: Clent Demark, PA-C    Brief Narrative:   Chief complaint: Nausea, vomiting, diarrhea  Medical records reviewed and are as summarized below:  Rhonda Lynch is an 63 y.o. 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  Last discharged from the hospital on 4/3 which appears to have been her last dialysis session.    Assessment/Plan:   Principal Problem:   Hyperkalemia Active Problems:   Cocaine abuse (HCC)   Epigastric pain   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[place])   Hypertension   Non-compliance with renal dialysis ([place])   Acute respiratory failure with hypoxia (HCC)   Type 2 diabetes mellitus (HCC)   Substance induced mood disorder (HCC)   End-stage renal disease on hemodialysis (HCC)   Metabolic acidosis, increased anion gap (IAG)   Volvulus (HCC)   Acute intractable nausea,vomiting,diarrhea, likely multifactorial etiology. Sleeping soundly this am. Requesting breakfast. Recently admitted  with C. difficile colitis but has been noncompliant with vancomycin dosing. Also non compliant with dialysis has missed last 2 sessions. No diarrhea. Abdominal xray with no bowel obstruction or free air. Some opacity concerning for multifocal pneumonia. No fever, no leukocytosis.  -continue oral vanc -Continue PRN Zofran -monitor intake and output -monitor oxygen saturation level -consider repeating xray after dialysis and/or prior to discharge. Hx of SARS CoV-2 negative  Acute respiratory failure with hypoxia, volume overload. Likely related to 2 missed dialysis sessions in setting of recent concern for HCAP treated with  pip/tazo. Abdominal xray as noted above. Max temp 99 oxygen sat 87% after morphine and fentanyl.  Patient on 2 L nasal cannula oxygen. Tested negative for coronavirus 3/27 -oxygen supplementation weaning to room air -dialysis -monitor -suspicion for infectious process low -incentive spirometry -Check two-view chest x-ray -mobilize  End-stage renal disease, dialysis Tuesday Thursday Saturday: Note left upper arm AV gore tex graft occluded s/p thrombectomy and revision 12/11/18. Patient has been noncompliant, has missed at least 2 if not more dialysis sessions at this point. Creatinine greater than 15.  Patient dialyzed on 4/10 and 4/11 -HD per nephrology  Remarkable history of medication/lifestyle noncompliance -counseling offered  Recent diagnosis of C. difficile colitis, POA: Patient was to complete oral vancomycin on 4/9.  Unclear if she took medications as prescribed.  On admission patient complaining of diarrhea and therefore was restarted on oral vancomycin.  However over the last 24 to 48 hours patient has had 1 bowel movement. -Discontinue oral vancomycin  Chronic anemia of chronic disease, stable.  -Baseline 8 to 9 g/dL  Hyperkalemia, resolved.  Related to missed dialysis.  On admission potassium 5.6, but resolved after hemodialysis  Anion gap metabolic acidosis in the setting of uremia: Resolved following hemodialysis.  Likely secondary to uremia.  Insulin dependentdiabetes type 2: Hemoglobin A1c 4.6 on 12/19/2018 -Continue home insulin regimen -Continue SSI/Hypoglycemia protocol  Polypharmacy/illicit substance abuse -Patient unable/unwilling to provide urine for UDS -Known history of cocaine abuse UDS positive on 3/25 -Lengthy discussion about need for cessation given elevated risk of morbidity and mortality  Hypertension. BP normalized following hemodialysis.  Likely related to missed dialysis. Home meds include norvasc. -continue norvasc -dialysis per nephrology   Body mass index is  21.53 kg/m.   Family Communication/Anticipated D/C date and plan/Code Status   DVT prophylaxis: Heparin Code Status: Full Code.  Family Communication: No family at bedside Disposition Plan: Possible home tomorrow   Medical Consultants:    Nephrology  Anti-Infectives:    None  Subjective:   Patient just got back from dialysis and wants to know when they are bringing her tray.  Objective:    Vitals:   12/20/18 0830 12/20/18 0900 12/20/18 0930 12/20/18 1000  BP: 126/77 137/79 123/77 113/73  Pulse: 97 (!) 104 97 (!) 103  Resp:      Temp:      TempSrc:      SpO2:      Weight:      Height:        Intake/Output Summary (Last 24 hours) at 12/20/2018 1131 Last data filed at 12/20/2018 0416 Gross per 24 hour  Intake 120 ml  Output 3000 ml  Net -2880 ml   Filed Weights   12/19/18 1520 12/19/18 1936 12/20/18 0746  Weight: 53.9 kg 50.8 kg 53.4 kg    Exam: Constitutional: Elderly female in NAD, calm, comfortable Eyes: PERRL, lids and conjunctivae normal ENMT: Mucous membranes are moist. Posterior pharynx clear of any exudate or lesions.  Neck: normal, supple, no masses, no thyromegaly Respiratory: clear to auscultation bilaterally, no wheezing, no crackles. Normal respiratory effort. No accessory muscle use.  Currently on 2 L nasal cannula oxygen. Cardiovascular: Tachycardic, no murmurs / rubs / gallops. No extremity edema. 2+ pedal pulses. No carotid bruits.  Abdomen: no tenderness, no masses palpated. No hepatosplenomegaly. Bowel sounds positive.  Musculoskeletal: no clubbing / cyanosis. No joint deformity upper and lower extremities. Good ROM, no contractures. Normal muscle tone.  Skin: no rashes, lesions, ulcers. No induration Neurologic: CN 2-12 grossly intact. Sensation intact, DTR normal. Strength 5/5 in all 4.  Psychiatric: Normal judgment and insight. Alert and oriented x 3. Normal mood.    Data Reviewed:   I have personally reviewed  following labs and imaging studies:  Labs: Labs show the following:   Basic Metabolic Panel: Recent Labs  Lab 12/18/18 1838 12/19/18 0031 12/20/18 0304  NA 137 135 136  K 5.6* 5.5* 4.7  CL 102 101 98  CO2 17* 16* 23  GLUCOSE 104* 87 91  BUN 78* 81* 22  CREATININE 15.10* 15.60* 7.02*  CALCIUM 7.8* 7.5* 8.0*   GFR Estimated Creatinine Clearance: 6.3 mL/min (A) (by C-G formula based on SCr of 7.02 mg/dL (H)). Liver Function Tests: Recent Labs  Lab 12/18/18 1838  AST 26  ALT 15  ALKPHOS 87  BILITOT 0.6  PROT 7.5  ALBUMIN 2.8*   Recent Labs  Lab 12/18/18 1838  LIPASE 55*   No results for input(s): AMMONIA in the last 168 hours. Coagulation profile No results for input(s): INR, PROTIME in the last 168 hours.  CBC: Recent Labs  Lab 12/18/18 1838 12/19/18 0031 12/20/18 0304  WBC 9.7 10.0 10.7*  NEUTROABS 6.6  --   --   HGB 10.0* 8.9* 9.3*  HCT 32.8* 27.3* 28.9*  MCV 76.8* 74.6* 74.5*  PLT 235 210 187   Cardiac Enzymes: No results for input(s): CKTOTAL, CKMB, CKMBINDEX, TROPONINI in the last 168 hours. BNP (last 3 results) No results for input(s): PROBNP in the last 8760 hours. CBG: Recent Labs  Lab 12/19/18 0504 12/19/18 1120 12/19/18 1147 12/19/18 2028 12/20/18 0421  GLUCAP 94 47* 73 200* 129*   D-Dimer: No results for input(s): DDIMER in the  last 72 hours. Hgb A1c: Recent Labs    12/19/18 0031  HGBA1C 4.6*   Lipid Profile: No results for input(s): CHOL, HDL, LDLCALC, TRIG, CHOLHDL, LDLDIRECT in the last 72 hours. Thyroid function studies: No results for input(s): TSH, T4TOTAL, T3FREE, THYROIDAB in the last 72 hours.  Invalid input(s): FREET3 Anemia work up: No results for input(s): VITAMINB12, FOLATE, FERRITIN, TIBC, IRON, RETICCTPCT in the last 72 hours. Sepsis Labs: Recent Labs  Lab 12/18/18 1838 12/18/18 2108 12/19/18 0031 12/20/18 0304  WBC 9.7  --  10.0 10.7*  LATICACIDVEN  --  1.0 0.8  --     Microbiology Recent Results  (from the past 240 hour(s))  MRSA PCR Screening     Status: None   Collection Time: 12/19/18  6:18 AM  Result Value Ref Range Status   MRSA by PCR NEGATIVE NEGATIVE Final    Comment:        The GeneXpert MRSA Assay (FDA approved for NASAL specimens only), is one component of a comprehensive MRSA colonization surveillance program. It is not intended to diagnose MRSA infection nor to guide or monitor treatment for MRSA infections. Performed at [facility], [place] [address], [place], [place] [address]     Procedures and diagnostic studies:  Dg Abdomen Acute W/chest  Result Date: 12/18/2018 CLINICAL DATA:  Abdominal pain and vomiting EXAM: DG ABDOMEN ACUTE W/ 1V CHEST COMPARISON:  Chest radiograph December 09, 2018. FINDINGS: PA chest: There is patchy airspace opacity throughout both lungs, slightly more on the left than on the right. There is cardiomegaly with pulmonary vascularity within normal limits. No adenopathy. There is aortic atherosclerosis. Central catheter tip is in the superior vena cava. Supine and upright abdomen: There is no bowel dilatation or air-fluid level to suggest bowel obstruction. No free air. There are surgical clips in the gallbladder fossa region. There are foci of vascular calcification in the pelvis. IMPRESSION: Airspace opacity throughout both lungs, slightly more on the left than on the right. Suspect multifocal pneumonia. Atypical organism pneumonia may present in this manner. There is cardiomegaly. Aortic Atherosclerosis (ICD10-I70.0). No bowel obstruction or free air evident. Surgical clips in right upper quadrant. Electronically Signed   By: Lowella Grip III M.D.   On: 12/18/2018 19:44    Medications:   . amLODipine  5 mg Oral QHS  . Chlorhexidine Gluconate Cloth  6 each Topical Q0600  . darbepoetin (ARANESP) injection - DIALYSIS  100 mcg Intravenous Q Sat-HD  . doxercalciferol  4 mcg Intravenous Q T,Th,Sa-HD  . fluticasone  2 spray Each Nare  Daily  . insulin aspart  0-9 Units Subcutaneous TID WC  . insulin glargine  3 Units Subcutaneous QHS  . pantoprazole  40 mg Oral Daily  . sevelamer carbonate  2,400 mg Oral TID WC  . vancomycin  125 mg Oral TID AC & HS   Continuous Infusions: . sodium chloride    . sodium chloride       LOS: 1 day   Dantrell Schertzer A Della Scrivener  [facility]   *Please refer to [email or web address], password [email or web address] to get updated schedule on who will round on this patient, as hospitalists switch teams weekly. If 7PM-7AM, please contact night-coverage at [email or web address], password [email or web address] for any overnight needs.

## 2018-12-21 LAB — BASIC METABOLIC PANEL
Anion gap: 15 (ref 5–15)
BUN: 22 mg/dL (ref 8–23)
CO2: 23 mmol/L (ref 22–32)
Calcium: 8 mg/dL — ABNORMAL LOW (ref 8.9–10.3)
Chloride: 98 mmol/L (ref 98–111)
Creatinine, Ser: 7.02 mg/dL — ABNORMAL HIGH (ref 0.44–1.00)
GFR calc Af Amer: 6 mL/min — ABNORMAL LOW (ref >60)
GFR calc non Af Amer: 6 mL/min — ABNORMAL LOW (ref >60)
Glucose, Bld: 91 mg/dL (ref 70–99)
Potassium: 4.7 mmol/L (ref 3.5–5.1)
Sodium: 136 mmol/L (ref 135–145)

## 2018-12-21 LAB — GLUCOSE, CAPILLARY
Glucose-Capillary: 91 mg/dL (ref 70–99)
Glucose-Capillary: 150 mg/dL — ABNORMAL HIGH (ref 70–99)
Glucose-Capillary: 135 mg/dL — ABNORMAL HIGH (ref 70–99)
Glucose-Capillary: 122 mg/dL — ABNORMAL HIGH (ref 70–99)

## 2018-12-21 MED ORDER — LIDOCAINE HCL (PF) 2 % IJ SOLN
0.0000 mL | Freq: Once | INTRAMUSCULAR | Status: DC | PRN
  Filled 2018-12-21: qty 20

## 2018-12-21 MED ORDER — SEVELAMER CARBONATE 800 MG PO TABS
1600.0000 mg | ORAL_TABLET | Freq: Three times a day (TID) | ORAL | Status: DC
  Administered 2018-12-21 – 2018-12-22 (×4): 1600 mg via ORAL
  Filled 2018-12-21 (×3): qty 2

## 2018-12-21 NOTE — Progress Notes (Signed)
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 She has missed last 2 dialysis sessions. Recently discharged on po vanc and has been non compliant.  Assessment & Plan:   Principal Problem:   Hyperkalemia Active Problems:   Cocaine abuse (HCC)   Epigastric pain   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[place])   Hypertension   Non-compliance with renal dialysis ([place])   Acute respiratory failure with hypoxia (HCC)   Type 2 diabetes mellitus (HCC)   Substance induced mood disorder (HCC)   End-stage renal disease on hemodialysis (HCC)   Metabolic acidosis, increased anion gap (IAG)   Volvulus (HCC)  Acute intractable nausea,vomiting,diarrhea, likely multifactorial etiology.  -Tolerated diet this AM -Recently admitted for Cdiff colitis, question how compliant patient is to her regimen -No BM this AM -continue oral vanc -monitor intake and output -Hx of SARS CoV-2 negative   Acute respiratory failure with hypoxia.  -hx gross noncompliance with HD sessions -Improved with HD overnight -Currently on minimal O2 support  Hyperkalemia related to missed dialysis. Mild, no ekg changes -continue to correct electrolytes on HD  End-stage renal disease, dialysis Tuesday Thursday Saturday  -Hx of gross compliance with HD -Nephrology following -Pt with left upper arm AV gore tex graft occluded s/p thrombectomy and revision 12/11/18, was used this visit -Discussed with Nephrology. Plan to remove temp HD cath tomorrow AM  Remarkable history of medication/lifestyle noncompliance -counseling offered  Recent diagnosis of C. difficile colitis, POA, resolving  -Continue vancomycin p.o. as aboveunclear stop dateperrecent discharge -continue for additional 48 hours follow symptomatically -no stool since admission  Chronic anemia of chronic disease, stable -Baseline at this time  Anion gap metabolic acidosis in the setting of uremia as above no dka. -In the setting of uremia  -Improved  Insulin dependentdiabetes type 2, poorly controlled  A1c 6.1 in November 2019. -A1c of 4.6 -Continue home insulin regimen -Continue SSI/Hypoglycemia protocol as needed  Polypharmacy/illicit substance abuse -Known history of cocaine abuse -Patient is interested in drug rehab programs. Congratulated on wanting to quit  Hypertension. BP high end of normal. Likely related to missed dialysis. Home meds include norvasc -continue norvasc -dialysis per nephrology -Continue with hydralazine as needed  DVT prophylaxis: Heparin sub q Code Status: Full Family Communication: Pt in room, family not at bedside Disposition Plan: Uncertain at this time  Consultants:   Nephrology  Procedures:   dialysis  Antimicrobials: Anti-infectives (From admission, onward)   Start     Dose/Rate Route Frequency Ordered Stop   12/19/18 0000  vancomycin (VANCOCIN) 50 mg/mL oral solution 125 mg  Status:  Discontinued     125 mg Oral 3 times daily before meals & bedtime 12/18/18 2248 12/20/18 1549       Subjective: Asking about drug abuse programs available  Objective: Vitals:   12/20/18 1630 12/20/18 2150 12/21/18 0628 12/21/18 0756  BP: 97/68 112/79 123/76 118/80  Pulse: (!) 128 (!) 117 (!) 117 (!) 108  Resp: 19 20 19 18   Temp: 98.9 F (37.2 C) 98.7 F (37.1 C) 98.6 F (37 C) 98.3 F (36.8 C)  TempSrc: Oral Oral Oral Oral  SpO2: 94% 96% 93% 93%  Weight:  52.3 kg  Height:        Intake/Output Summary (Last 24 hours) at 12/21/2018 1340 Last data filed at 12/21/2018 1037 Gross per 24 hour  Intake 620 ml  Output 100 ml  Net 520 ml   Filed  Weights   12/20/18 0746 12/20/18 1201 12/20/18 2150  Weight: 53.4 kg 51.6 kg 52.3 kg    Examination:  General exam: Appears calm and comfortable  Respiratory system: Clear to auscultation. Respiratory effort normal. Cardiovascular system: S1 & S2 heard, RRR Gastrointestinal system: Abdomen is nondistended, soft and nontender. No organomegaly or masses felt. Normal bowel sounds heard. Central nervous system: Alert and oriented. No focal neurological deficits. Extremities: Symmetric 5 x 5 power. Skin: No rashes, lesions  Psychiatry: Judgement and insight appear normal. Mood & affect appropriate.   Data Reviewed: I have personally reviewed following labs and imaging studies  CBC: Recent Labs  Lab 12/18/18 1838 12/19/18 0031 12/20/18 0304  WBC 9.7 10.0 10.7*  NEUTROABS 6.6  --   --   HGB 10.0* 8.9* 9.3*  HCT 32.8* 27.3* 28.9*  MCV 76.8* 74.6* 74.5*  PLT 235 210 865   Basic Metabolic Panel: Recent Labs  Lab 12/18/18 1838 12/19/18 0031 12/20/18 0304  NA 137 135 136  K 5.6* 5.5* 4.7  CL 102 101 98  CO2 17* 16* 23  GLUCOSE 104* 87 91  BUN 78* 81* 22  CREATININE 15.10* 15.60* 7.02*  CALCIUM 7.8* 7.5* 8.0*   GFR: Estimated Creatinine Clearance: 6.3 mL/min (A) (by C-G formula based on SCr of 7.02 mg/dL (H)). Liver Function Tests: Recent Labs  Lab 12/18/18 1838  AST 26  ALT 15  ALKPHOS 87  BILITOT 0.6  PROT 7.5  ALBUMIN 2.8*   Recent Labs  Lab 12/18/18 1838  LIPASE 55*   No results for input(s): AMMONIA in the last 168 hours. Coagulation Profile: No results for input(s): INR, PROTIME in the last 168 hours. Cardiac Enzymes: No results for input(s): CKTOTAL, CKMB, CKMBINDEX, TROPONINI in the last 168 hours. BNP (last 3 results) No results for input(s): PROBNP in the last 8760 hours. HbA1C: Recent Labs    12/19/18 0031  HGBA1C 4.6*   CBG: Recent Labs  Lab 12/20/18 1254 12/20/18 1625 12/20/18 2152 12/21/18 0630 12/21/18 1150  GLUCAP 115* 172* 123*  91 150*   Lipid Profile: No results for input(s): CHOL, HDL, LDLCALC, TRIG, CHOLHDL, LDLDIRECT in the last 72 hours. Thyroid Function Tests: No results for input(s): TSH, T4TOTAL, FREET4, T3FREE, THYROIDAB in the last 72 hours. Anemia Panel: No results for input(s): VITAMINB12, FOLATE, FERRITIN, TIBC, IRON, RETICCTPCT in the last 72 hours. Sepsis Labs: Recent Labs  Lab 12/18/18 2108 12/19/18 0031  LATICACIDVEN 1.0 0.8    Recent Results (from the past 240 hour(s))  MRSA PCR Screening     Status: None   Collection Time: 12/19/18  6:18 AM  Result Value Ref Range Status   MRSA by PCR NEGATIVE NEGATIVE Final    Comment:        The GeneXpert MRSA Assay (FDA approved for NASAL specimens only), is one component of a comprehensive MRSA colonization surveillance program. It is not intended to diagnose MRSA infection nor to guide or monitor treatment for MRSA infections. Performed at [facility], [place] [address] [place], [place] [address]      Radiology Studies: Dg Chest 2 View  Result Date: 12/20/2018 CLINICAL DATA:  Shortness of breath. Acute hypoxic respiratory failure. EXAM: CHEST - 2 VIEW COMPARISON:  Radiograph 12/18/2018 FINDINGS:  Right dialysis catheter remains in place. Unchanged cardiomegaly and mediastinal contours. Fine interstitial opacities throughout both lungs, slightly worsened on the left since prior exam. Peripheral opacity at the right lung base is unchanged. No large pleural effusion or pneumothorax. IMPRESSION: Fine interstitial opacities throughout both lungs, slightly worsened on the left since prior exam. This may reflect pulmonary edema or atypical infection. Cardiomegaly is stable. Electronically Signed   By: Keith Rake M.D.   On: 12/20/2018 20:19    Scheduled Meds: . amLODipine  5 mg Oral QHS  . Chlorhexidine Gluconate Cloth  6 each Topical Q0600  . darbepoetin (ARANESP) injection - DIALYSIS  100 mcg Intravenous Q Sat-HD  . doxercalciferol  4  mcg Intravenous Q T,Th,Sa-HD  . fluticasone  2 spray Each Nare Daily  . heparin injection (subcutaneous)  5,000 Units Subcutaneous Q8H  . insulin aspart  0-9 Units Subcutaneous TID WC  . insulin glargine  3 Units Subcutaneous QHS  . pantoprazole  40 mg Oral Daily  . sevelamer carbonate  1,600 mg Oral TID WC   Continuous Infusions: . sodium chloride    . sodium chloride       LOS: 2 days   Marylu Lund, MD [facility] Pager On Amion  If 7PM-7AM, please contact night-coverage 12/21/2018, 1:40 PM

## 2018-12-21 NOTE — Patient Care Conference (Addendum)
Tried calling son at number listed. No answer. Will try again later  UPDATE: Was able to speak to patient's son. All questions answered.

## 2018-12-21 NOTE — Progress Notes (Signed)
CSW provided substance abuse assessment resources to the patient.   CSW signing off.   Domenic Schwab, MSW, [place]

## 2018-12-21 NOTE — Progress Notes (Addendum)
[facility] Progress Note   Dialysis Orders: [place] T,Th,S 4 hrs 180NRE 400/800 55 kg 2.0 K/ 2.5 Ca  L AVG/RIJ TDC -Heparin 3000 units IV TIW -Hectorol 4 mcg IV TIW -Mircera 100 mcg IV q 2 weeks (last dose 150 mcg IV 11/29/18 Last HGB 10.9 11/29/18)  Assessment/Plan: 1. Acute hypoxic Respiratory failure - seems mostly volume related - EDW lowered significatnly Recent negative SARS Cov-2 test.   2. Intractible N & V, D. Probably combination of uremia from missed HD and non-compliance with ABX for C-diff colitis. Restarted oral vanc. Improved per patient - compliance with meds as an outpatient may be a challenge 3. Uremia due to missed treatments since last d/c -corrected with dialysis  4.  ESRD -  T,Th,S.  Should be able to get catheter out this admission - Discussed with Dr. Oneida Alar - VVS to remove [facility] tomorrow am.  AVGG required declot last admission but has been working well since then. 5.  Hypertension/volume  - BP down with meds and lowering of volume net UF 3 L Friday to 50.8 - below edw and 2 L Sat- standing post wt  51.6 --this is significantly below prior EDW  6.  Anemia  - HGB 8.9 > 9.3 . ESA not given as ordered Friday - was given 4/11 Aranesp 100 7.  Metabolic bone disease - VDRA. No binders on OP med list. Continue renvela. P not checked this admission yet 8.  Nutrition -Renal/Carb mod diet. Albumin 2.8. Add prostat, renal vits- unintentional weight loss 9.  DM-per primary 10.  Noncompliance with dialysis - had not been to dialysis since d/c until she went the day of admission then left because  "she couldn't find her purse/" 11. Disp - has unstable social situation/substance use adding to medical compliance challenges - refer to SW for addiction issues   Myriam Jacobson, PA-C [place] [phone] 12/21/2018,9:28 AM  LOS: 2 days   Subjective:   Wants to go to rehab.  Explained there are hardly an inpatient rehab facilities available.   Out  patient rehab is available - not certain what is avail at present given COVID - refer to SW. Frustrated with unintentional weight loss  Objective Vitals:   12/20/18 1630 12/20/18 2150 12/21/18 0628 12/21/18 0756  BP: 97/68 112/79 123/76 118/80  Pulse: (!) 128 (!) 117 (!) 117 (!) 108  Resp: 19 20 19 18   Temp: 98.9 F (37.2 C) 98.7 F (37.1 C) 98.6 F (37 C) 98.3 F (36.8 C)  TempSrc: Oral Oral Oral Oral  SpO2: 94% 96% 93% 93%  Weight:  52.3 kg    Height:       Physical Exam General: ill appearing irritable Heart: tachy regular Lungs: no rales Abdomen: soft NT Extremities: no LE edema Dialysis Access: left AVGG + bruit and right IJ [facility]   Additional Objective Labs: Basic Metabolic Panel: Recent Labs  Lab 12/18/18 1838 12/19/18 0031 12/20/18 0304  NA 137 135 136  K 5.6* 5.5* 4.7  CL 102 101 98  CO2 17* 16* 23  GLUCOSE 104* 87 91  BUN 78* 81* 22  CREATININE 15.10* 15.60* 7.02*  CALCIUM 7.8* 7.5* 8.0*   Liver Function Tests: Recent Labs  Lab 12/18/18 1838  AST 26  ALT 15  ALKPHOS 87  BILITOT 0.6  PROT 7.5  ALBUMIN 2.8*   Recent Labs  Lab 12/18/18 1838  LIPASE 55*   CBC: Recent Labs  Lab 12/18/18 1838 12/19/18 0031 12/20/18 0304  WBC 9.7 10.0 10.7*  NEUTROABS 6.6  --   --   HGB 10.0* 8.9* 9.3*  HCT 32.8* 27.3* 28.9*  MCV 76.8* 74.6* 74.5*  PLT 235 210 187   Blood Culture    Component Value Date/Time   SDES BLOOD RIGHT WRIST 12/03/2018 1213   SPECREQUEST  12/03/2018 1213    BOTTLES DRAWN AEROBIC AND ANAEROBIC Blood Culture results may not be optimal due to an inadequate volume of blood received in culture bottles   CULT  12/03/2018 1213    NO GROWTH 5 DAYS Performed at [facility] [address], [place], [place] [address]    REPTSTATUS 12/08/2018 FINAL 12/03/2018 1213    Cardiac Enzymes: No results for input(s): CKTOTAL, CKMB, CKMBINDEX, TROPONINI in the last 168 hours. CBG: Recent Labs  Lab 12/20/18 0421 12/20/18 1254  12/20/18 1625 12/20/18 2152 12/21/18 0630  GLUCAP 129* 115* 172* 123* 91   Iron Studies: No results for input(s): IRON, TIBC, TRANSFERRIN, FERRITIN in the last 72 hours. Lab Results  Component Value Date   INR 0.94 10/27/2018   INR 0.96 09/29/2018   INR 0.97 07/23/2018   Studies/Results: Dg Chest 2 View  Result Date: 12/20/2018 CLINICAL DATA:  Shortness of breath. Acute hypoxic respiratory failure. EXAM: CHEST - 2 VIEW COMPARISON:  Radiograph 12/18/2018 FINDINGS: Right dialysis catheter remains in place. Unchanged cardiomegaly and mediastinal contours. Fine interstitial opacities throughout both lungs, slightly worsened on the left since prior exam. Peripheral opacity at the right lung base is unchanged. No large pleural effusion or pneumothorax. IMPRESSION: Fine interstitial opacities throughout both lungs, slightly worsened on the left since prior exam. This may reflect pulmonary edema or atypical infection. Cardiomegaly is stable. Electronically Signed   By: Keith Rake M.D.   On: 12/20/2018 20:19   Medications: . sodium chloride    . sodium chloride     . amLODipine  5 mg Oral QHS  . Chlorhexidine Gluconate Cloth  6 each Topical Q0600  . darbepoetin (ARANESP) injection - DIALYSIS  100 mcg Intravenous Q Sat-HD  . doxercalciferol  4 mcg Intravenous Q T,Th,Sa-HD  . fluticasone  2 spray Each Nare Daily  . heparin injection (subcutaneous)  5,000 Units Subcutaneous Q8H  . insulin aspart  0-9 Units Subcutaneous TID WC  . insulin glargine  3 Units Subcutaneous QHS  . pantoprazole  40 mg Oral Daily  . sevelamer carbonate  2,400 mg Oral TID WC

## 2018-12-22 DIAGNOSIS — Z992 Dependence on renal dialysis: Secondary | ICD-10-CM

## 2018-12-22 DIAGNOSIS — N186 End stage renal disease: Secondary | ICD-10-CM | POA: Diagnosis not present

## 2018-12-22 LAB — GLUCOSE, CAPILLARY
Glucose-Capillary: 104 mg/dL — ABNORMAL HIGH (ref 70–99)
Glucose-Capillary: 160 mg/dL — ABNORMAL HIGH (ref 70–99)

## 2018-12-22 MED ORDER — LIDOCAINE HCL (PF) 1 % IJ SOLN: 5.0000 mL | Freq: Once | INTRAMUSCULAR | Status: DC

## 2018-12-22 MED ORDER — LIDOCAINE HCL (PF) 2 % IJ SOLN
0.0000 mL | Freq: Once | INTRAMUSCULAR | Status: DC | PRN
  Filled 2018-12-22 (×2): qty 20

## 2018-12-22 NOTE — Discharge Summary (Signed)
Physician Discharge Summary  Rhonda Lynch [facility]:[id number] DOB: [birth year] DOA: 12/18/2018  PCP: Clent Demark, PA-C  Admit date: 12/18/2018 Discharge date: 12/22/2018  Admitted From: Home Disposition:  Home  Recommendations for Outpatient Follow-up:  1. Follow up with PCP in 1-2 weeks 2. 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 She has missed last 2 dialysis sessions. Recently discharged on po vanc and has been non compliant.  Discharge Diagnoses:  Principal Problem:   Hyperkalemia Active Problems:   Cocaine abuse (HCC)   Epigastric pain   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[place])   Hypertension   Non-compliance with renal dialysis ([place])   Acute respiratory failure with hypoxia (HCC)   Type 2 diabetes mellitus (HCC)   Substance induced mood disorder (HCC)   End-stage renal disease on hemodialysis (HCC)   Metabolic acidosis, increased anion gap (IAG)   Volvulus (HCC)  Acute intractable nausea,vomiting,diarrhea, likely multifactorial etiology.  -Tolerated diet this AM -Recently admitted for Cdiff colitis, question how compliant patient is to her regimen -continued on oral vanc -monitor intake and output -Hx of SARS CoV-2 negative   Acute respiratory failure with hypoxia.  -hx gross noncompliance with HD sessions -Improved with HD overnight -Currently on minimal O2 support  Hyperkalemiarelated to missed dialysis. Mild, no ekg changes -continue to correct electrolytes on HD  End-stage renal disease, dialysis Tuesday Thursday Saturday  -Hx of gross compliance with HD -Nephrology following -Pt with left upper arm  AV gore tex graft occluded s/p thrombectomy and revision 12/11/18, was used this visit -Temp HD cath removed 4/13 -Discussed with Nephrology. Plan to remove temp HD cath tomorrow AM  Remarkable history of medication/lifestyle noncompliance -counseling offered  Recent diagnosis of C. difficile colitis, POA, resolving -Continue vancomycin p.o. as aboveunclear stop dateperrecent discharge -continue for additional 48 hours follow symptomatically -Stable  Chronic anemia of chronic disease, stable -Baseline at this time  Anion gap metabolic acidosis in the setting of uremia as aboveno dka. -In the setting of uremia -Improved  Insulin dependentdiabetes type 2, poorly controlledA1c 6.1 in November 2019. -A1c of 4.6 -Continue home insulin regimen -Continue SSI/Hypoglycemia protocol as needed  Polypharmacy/illicit substance abuse -Known history of cocaine abuse -Patient is interested in drug rehab programs. Congratulated on wanting to quit -Patient observed on phone making arrangements in room  Hypertension. BP high end of normal. Likely related to missed dialysis. Home meds include norvasc -continue norvasc -dialysis per nephrology  Discharge Instructions   Allergies as of 12/22/2018      Reactions   Acetaminophen Nausea And Vomiting   Patient states Acetaminophen and Acetaminophen containing products make her nauseated. She demonstrated this 06/21/18 with nausea followed by emesis.   Hydroxyzine Itching      Medication List    TAKE these medications   Accu-Chek Aviva device Use as instructed   accu-chek soft touch lancets Use twice per day.   acetaminophen 500 MG tablet Commonly known as:  TYLENOL Take 1,000 mg by mouth every 6 (six) hours as needed for mild pain.   amLODipine 5 MG tablet Commonly known as:  NORVASC Take 1 tablet (5 mg total) by mouth at bedtime.   calcitRIOL 0.25 MCG capsule Commonly known as:  ROCALTROL Take 1 capsule (0.25  mcg  total) by mouth every Monday, Wednesday, and Friday with hemodialysis.   Dialyvite 800-Zinc 15 0.8 MG Tabs Take 1 tablet by mouth daily.   diphenhydrAMINE 2 % cream Commonly known as:  BENADRYL Apply 1 application topically 3 (three) times daily as needed for itching.   fluticasone 50 MCG/ACT nasal spray Commonly known as:  FLONASE Place 2 sprays into both nostrils daily.   glucose blood test strip Commonly known as:  Accu-Chek Aviva Use twice per day.   insulin aspart 100 UNIT/ML injection Commonly known as:  novoLOG Inject 5 Units into the skin 3 (three) times daily with meals. What changed:  how much to take   insulin glargine 100 UNIT/ML injection Commonly known as:  LANTUS Inject 0.03 mLs (3 Units total) into the skin at bedtime. What changed:  how much to take   INSULIN SYRINGE .3CC/31GX5/16" 31G X 5/16" 0.3 ML Misc Inject 1 each into the skin 4 (four) times daily.   pantoprazole 40 MG tablet Commonly known as:  Protonix Take 1 tablet (40 mg total) by mouth daily.   sevelamer carbonate 800 MG tablet Commonly known as:  RENVELA Take 2 tablets (1,600 mg total) by mouth 3 (three) times daily with meals.   vancomycin 125 MG capsule Commonly known as:  VANCOCIN Take 1 capsule (125 mg total) by mouth 4 (four) times daily.       Allergies  Allergen Reactions  . Acetaminophen Nausea And Vomiting    Patient states Acetaminophen and Acetaminophen containing products make her nauseated. She demonstrated this 06/21/18 with nausea followed by emesis.  Marland Kitchen Hydroxyzine Itching    Consultations:  Nephrology  Procedures/Studies: Dg Chest 2 View  Result Date: 12/20/2018 CLINICAL DATA:  Shortness of breath. Acute hypoxic respiratory failure. EXAM: CHEST - 2 VIEW COMPARISON:  Radiograph 12/18/2018 FINDINGS: Right dialysis catheter remains in place. Unchanged cardiomegaly and mediastinal contours. Fine interstitial opacities throughout both lungs, slightly worsened on the  left since prior exam. Peripheral opacity at the right lung base is unchanged. No large pleural effusion or pneumothorax. IMPRESSION: Fine interstitial opacities throughout both lungs, slightly worsened on the left since prior exam. This may reflect pulmonary edema or atypical infection. Cardiomegaly is stable. Electronically Signed   By: Keith Rake M.D.   On: 12/20/2018 20:19   Ct Chest W Contrast  Result Date: 12/03/2018 CLINICAL DATA:  Acute generalized abdominal pain. Shortness of breath. EXAM: CT CHEST, ABDOMEN, AND PELVIS WITH CONTRAST TECHNIQUE: Multidetector CT imaging of the chest, abdomen and pelvis was performed following the standard protocol during bolus administration of intravenous contrast. CONTRAST:  150mL OMNIPAQUE IOHEXOL 300 MG/ML  SOLN COMPARISON:  CT scan of October 23, 2018. FINDINGS: CT CHEST FINDINGS Cardiovascular: There is no evidence of thoracic aortic dissection or aneurysm. Mild cardiomegaly is noted. No pericardial effusion is noted. Right internal jugular dialysis catheter is noted. Mediastinum/Nodes: No enlarged mediastinal, hilar, or axillary lymph nodes. Thyroid gland, trachea, and esophagus demonstrate no significant findings. Lungs/Pleura: Bilateral diffuse reticular and possibly airspace opacities are noted throughout both lungs. Also noted is fluid in both major fissures, right greater than left. Minimal left basilar effusion is noted as well. Given the history of end-stage renal disease and cardiomegaly, these findings are most consistent with pulmonary edema, although superimposed inflammation or infection may be present. Musculoskeletal: No chest wall mass or suspicious bone lesions identified. CT ABDOMEN PELVIS FINDINGS Hepatobiliary: No focal liver abnormality is seen. Status post cholecystectomy. No biliary dilatation. Pancreas: Unremarkable. No pancreatic ductal  dilatation or surrounding inflammatory changes. Spleen: Stable focal low density is noted within  the spleen consistent with cyst or hemangioma. Adrenals/Urinary Tract: Adrenal glands are unremarkable. Kidneys are normal, without renal calculi, focal lesion, or hydronephrosis. Bladder is unremarkable. Stomach/Bowel: Stomach is within normal limits. Appendix appears normal. No evidence of bowel wall thickening, distention, or inflammatory changes. Vascular/Lymphatic: Aortic atherosclerosis. No enlarged abdominal or pelvic lymph nodes. Reproductive: Status post hysterectomy. Stable appearance of fatty lesion in left ovary concerning for dermoid. Right adnexal region is unremarkable. Other: No abdominal wall hernia or abnormality. No abdominopelvic ascites. Musculoskeletal: No acute or significant osseous findings. IMPRESSION: Bilateral diffuse interstitial and possibly airspace opacities are noted throughout both lungs, with mild amount of fluid in both major fissures and mild left basilar pleural effusion. These findings are most consistent with pulmonary edema given the history of end-stage renal disease and dialysis, although pneumonia or combination thereof can not be excluded. Stable chronic findings are noted in the abdomen and pelvis as described above. Aortic Atherosclerosis (ICD10-I70.0). Electronically Signed   By: Marijo Conception, M.D.   On: 12/03/2018 16:28   Ct Abdomen Pelvis W Contrast  Result Date: 12/03/2018 CLINICAL DATA:  Acute generalized abdominal pain. Shortness of breath. EXAM: CT CHEST, ABDOMEN, AND PELVIS WITH CONTRAST TECHNIQUE: Multidetector CT imaging of the chest, abdomen and pelvis was performed following the standard protocol during bolus administration of intravenous contrast. CONTRAST:  132mL OMNIPAQUE IOHEXOL 300 MG/ML  SOLN COMPARISON:  CT scan of October 23, 2018. FINDINGS: CT CHEST FINDINGS Cardiovascular: There is no evidence of thoracic aortic dissection or aneurysm. Mild cardiomegaly is noted. No pericardial effusion is noted. Right internal jugular dialysis catheter is  noted. Mediastinum/Nodes: No enlarged mediastinal, hilar, or axillary lymph nodes. Thyroid gland, trachea, and esophagus demonstrate no significant findings. Lungs/Pleura: Bilateral diffuse reticular and possibly airspace opacities are noted throughout both lungs. Also noted is fluid in both major fissures, right greater than left. Minimal left basilar effusion is noted as well. Given the history of end-stage renal disease and cardiomegaly, these findings are most consistent with pulmonary edema, although superimposed inflammation or infection may be present. Musculoskeletal: No chest wall mass or suspicious bone lesions identified. CT ABDOMEN PELVIS FINDINGS Hepatobiliary: No focal liver abnormality is seen. Status post cholecystectomy. No biliary dilatation. Pancreas: Unremarkable. No pancreatic ductal dilatation or surrounding inflammatory changes. Spleen: Stable focal low density is noted within the spleen consistent with cyst or hemangioma. Adrenals/Urinary Tract: Adrenal glands are unremarkable. Kidneys are normal, without renal calculi, focal lesion, or hydronephrosis. Bladder is unremarkable. Stomach/Bowel: Stomach is within normal limits. Appendix appears normal. No evidence of bowel wall thickening, distention, or inflammatory changes. Vascular/Lymphatic: Aortic atherosclerosis. No enlarged abdominal or pelvic lymph nodes. Reproductive: Status post hysterectomy. Stable appearance of fatty lesion in left ovary concerning for dermoid. Right adnexal region is unremarkable. Other: No abdominal wall hernia or abnormality. No abdominopelvic ascites. Musculoskeletal: No acute or significant osseous findings. IMPRESSION: Bilateral diffuse interstitial and possibly airspace opacities are noted throughout both lungs, with mild amount of fluid in both major fissures and mild left basilar pleural effusion. These findings are most consistent with pulmonary edema given the history of end-stage renal disease and  dialysis, although pneumonia or combination thereof can not be excluded. Stable chronic findings are noted in the abdomen and pelvis as described above. Aortic Atherosclerosis (ICD10-I70.0). Electronically Signed   By: Marijo Conception, M.D.   On: 12/03/2018 16:28   Dg Chest Port 1 View  Result Date:  12/09/2018 CLINICAL DATA:  Pulmonary edema EXAM: PORTABLE CHEST 1 VIEW COMPARISON:  12/08/2010 FINDINGS: Cardiac shadow remains enlarged. Aortic calcifications are again seen. Dialysis catheter is again noted. Lungs are well aerated bilaterally with diffuse interstitial changes stable from the prior exam. Some slight improved aeration in the right base is noted. No new focal abnormality is noted. IMPRESSION: Minimal improvement in the right base when compared with the prior exam. Electronically Signed   By: Inez Catalina M.D.   On: 12/09/2018 07:55   Dg Chest Port 1 View  Result Date: 12/08/2018 CLINICAL DATA:  Respiratory failure EXAM: PORTABLE CHEST 1 VIEW COMPARISON:  12/07/2018 FINDINGS: Cardiac shadow remains enlarged. Dialysis catheter remains in place. Aortic calcifications are noted. Lungs are well aerated bilaterally with diffuse interstitial changes similar to that seen on the prior exam. Stable right basilar opacity is noted. No sizable effusion is seen. No bony abnormality is noted. IMPRESSION: Stable interstitial changes and right basilar opacity when compared with the prior exam. No new focal abnormality is noted. Electronically Signed   By: Inez Catalina M.D.   On: 12/08/2018 07:45   Dg Chest Port 1 View  Result Date: 12/07/2018 CLINICAL DATA:  Acute respiratory failure EXAM: PORTABLE CHEST 1 VIEW COMPARISON:  12/06/2018 FINDINGS: Cardiomegaly with mild interstitial edema. Mild patchy right basilar opacity, atelectasis versus pneumonia, grossly unchanged. No definite pleural effusions. No pneumothorax. Right IJ dual lumen dialysis catheter terminates at the cavoatrial junction. Interval  extubation and removal of enteric tube. IMPRESSION: Interval extubation and removal of enteric tube. Cardiomegaly with mild interstitial edema. No definite pleural effusions. Mild patchy right basilar opacity, atelectasis versus pneumonia, unchanged. Electronically Signed   By: Julian Hy M.D.   On: 12/07/2018 05:36   Dg Chest Port 1 View  Result Date: 12/06/2018 CLINICAL DATA:  Acute respiratory failure with hypoxia EXAM: PORTABLE CHEST 1 VIEW COMPARISON:  12/05/2018 FINDINGS: Endotracheal tube terminates 3 cm above the carina. Multifocal patchy right lung opacities, suspicious for pneumonia. Right basilar opacity is mildly improved. Faint interstitial/airspace opacities in the left upper lobe, unchanged. No definite pleural effusions. No pneumothorax. Cardiomegaly. Enteric tube courses into the mid gastric body. Right IJ dual lumen dialysis catheter terminates at the cavoatrial junction. IMPRESSION: Endotracheal tube terminates 3 cm above the carina. Additional support apparatus as above. Multifocal right lung opacities, lower lobe predominant, suspicious for pneumonia. Superimposed interstitial/airspace opacities may reflect mild interstitial edema. No definite pleural effusions. Electronically Signed   By: Julian Hy M.D.   On: 12/06/2018 07:35   Dg Chest Port 1 View  Result Date: 12/05/2018 CLINICAL DATA:  Hypoxia EXAM: PORTABLE CHEST 1 VIEW COMPARISON:  December 04, 2018 FINDINGS: Endotracheal tube tip is 2.1 cm above the carina. Nasogastric tube tip and side port are below the diaphragm. Side port is seen in the stomach. Central catheter tip is in the superior vena cava. No pneumothorax. There is interstitial and patchy airspace opacity throughout the lungs, more severe on the right than on the left. There has been partial clearing of airspace opacity on the right and left sides compared to 1 day prior. No new opacity evident. Heart remains enlarged with mild pulmonary venous hypertension.  No adenopathy. There is aortic atherosclerosis. No bone lesions. IMPRESSION: Tube and catheter positions as described without pneumothorax. Areas of interstitial and airspace opacity bilaterally, more severe on the right than on the left, with mild partial clearing bilaterally. The appearance raises question of a degree of ARDS. No new opacity evident. Underlying pulmonary  vascular congestion noted. Aortic Atherosclerosis (ICD10-I70.0). Electronically Signed   By: Lowella Grip III M.D.   On: 12/05/2018 07:20   Dg Chest Port 1 View  Result Date: 12/04/2018 CLINICAL DATA:  Respiratory distress EXAM: PORTABLE CHEST 1 VIEW COMPARISON:  12/03/2018 FINDINGS: Endotracheal tube in good position unchanged. Dual lumen central venous catheter tip at the cavoatrial junction unchanged. NG tube in the stomach. Severe diffuse bilateral airspace disease right greater than left is unchanged. Small bilateral effusions. IMPRESSION: Diffuse bilateral airspace disease right greater than left unchanged. Electronically Signed   By: Franchot Gallo M.D.   On: 12/04/2018 08:17   Dg Chest Port 1 View  Result Date: 12/03/2018 CLINICAL DATA:  63 year old female with possible COVID 19, intubated. EXAM: PORTABLE CHEST 1 VIEW COMPARISON:  Chest CT earlier today. FINDINGS: Portable AP semi upright view at 2211 hours. Endotracheal tube tip in good position between the level the clavicles and carina. Stable right IJ dual lumen catheter. Diffuse bilateral granular pulmonary opacity persists, greater on the right, and not significantly changed since 12/20 5 hours today. Stable cardiomegaly and mediastinal contours. Paucity of bowel gas in the upper abdomen IMPRESSION: 1. Endotracheal tube tip in good position. 2. No significant change in diffuse bilateral granular pulmonary opacity. Electronically Signed   By: Genevie Ann M.D.   On: 12/03/2018 23:08   Dg Chest Port 1 View  Result Date: 12/03/2018 CLINICAL DATA:  Shortness of breath. EXAM:  PORTABLE CHEST 1 VIEW COMPARISON:  Radiograph of November 25, 2018. FINDINGS: Stable cardiomegaly. Right internal jugular dialysis catheter is noted. No pneumothorax is noted. Increased bilateral diffuse lung opacities are noted concerning for pneumonia or possibly edema, right greater than left. Small right pleural effusion may be present. Bony thorax is unremarkable. IMPRESSION: Significantly increased bilateral lung opacities are noted, right greater than left, concerning for pneumonia or possibly edema. Small right pleural effusion is noted. Electronically Signed   By: Marijo Conception, M.D.   On: 12/03/2018 12:58   Dg Chest Port 1 View  Result Date: 11/25/2018 CLINICAL DATA:  Overdose EXAM: PORTABLE CHEST 1 VIEW COMPARISON:  Five days ago FINDINGS: Chronic cardiomegaly with size accentuated by technique. Dialysis catheter on the right with tips at the upper cavoatrial junction. Improved aeration since prior. There is interstitial coarsening that is likely at patient's baseline. Mild density at the right base likely reflecting scar based on previous imaging. IMPRESSION: Baseline appearance of the chest. No acute finding when compared to priors. Electronically Signed   By: Monte Fantasia M.D.   On: 11/25/2018 04:57   Dg Abdomen Acute W/chest  Result Date: 12/18/2018 CLINICAL DATA:  Abdominal pain and vomiting EXAM: DG ABDOMEN ACUTE W/ 1V CHEST COMPARISON:  Chest radiograph December 09, 2018. FINDINGS: PA chest: There is patchy airspace opacity throughout both lungs, slightly more on the left than on the right. There is cardiomegaly with pulmonary vascularity within normal limits. No adenopathy. There is aortic atherosclerosis. Central catheter tip is in the superior vena cava. Supine and upright abdomen: There is no bowel dilatation or air-fluid level to suggest bowel obstruction. No free air. There are surgical clips in the gallbladder fossa region. There are foci of vascular calcification in the pelvis.  IMPRESSION: Airspace opacity throughout both lungs, slightly more on the left than on the right. Suspect multifocal pneumonia. Atypical organism pneumonia may present in this manner. There is cardiomegaly. Aortic Atherosclerosis (ICD10-I70.0). No bowel obstruction or free air evident. Surgical clips in right upper quadrant. Electronically Signed  By: Lowella Grip III M.D.   On: 12/18/2018 19:44    Subjective: Wanting to quit cocaine  Discharge Exam: Vitals:   12/22/18 0719 12/22/18 0846  BP: 132/66 113/67  Pulse: 89 (!) 109  Resp:  18  Temp:  98.4 F (36.9 C)  SpO2:  91%   Vitals:   12/21/18 2120 12/22/18 0532 12/22/18 0719 12/22/18 0846  BP: 133/66 (!) 153/126 132/66 113/67  Pulse: 99 97 89 (!) 109  Resp: 19 20  18   Temp: 98.9 F (37.2 C) 98.9 F (37.2 C)  98.4 F (36.9 C)  TempSrc: Oral Oral  Oral  SpO2: 91% 93%  91%  Weight: 56.3 kg     Height:        General: Pt is alert, awake, not in acute distress Cardiovascular: RRR, S1/S2 +, no rubs, no gallops Respiratory: CTA bilaterally, no wheezing, no rhonchi Abdominal: Soft, NT, ND, bowel sounds + Extremities: no edema, no cyanosis   The results of significant diagnostics from this hospitalization (including imaging, microbiology, ancillary and laboratory) are listed below for reference.     Microbiology: Recent Results (from the past 240 hour(s))  MRSA PCR Screening     Status: None   Collection Time: 12/19/18  6:18 AM  Result Value Ref Range Status   MRSA by PCR NEGATIVE NEGATIVE Final    Comment:        The GeneXpert MRSA Assay (FDA approved for NASAL specimens only), is one component of a comprehensive MRSA colonization surveillance program. It is not intended to diagnose MRSA infection nor to guide or monitor treatment for MRSA infections. Performed at [facility], [place] [address] [place], [place] [address]      Labs: BNP (last 3 results) Recent Labs    02/12/18 1534 06/20/18 2042  12/03/18 1209  BNP 225.7* >4,500.0* >9,233.0*   Basic Metabolic Panel: Recent Labs  Lab 12/18/18 1838 12/19/18 0031 12/20/18 0304  NA 137 135 136  K 5.6* 5.5* 4.7  CL 102 101 98  CO2 17* 16* 23  GLUCOSE 104* 87 91  BUN 78* 81* 22  CREATININE 15.10* 15.60* 7.02*  CALCIUM 7.8* 7.5* 8.0*   Liver Function Tests: Recent Labs  Lab 12/18/18 1838  AST 26  ALT 15  ALKPHOS 87  BILITOT 0.6  PROT 7.5  ALBUMIN 2.8*   Recent Labs  Lab 12/18/18 1838  LIPASE 55*   No results for input(s): AMMONIA in the last 168 hours. CBC: Recent Labs  Lab 12/18/18 1838 12/19/18 0031 12/20/18 0304  WBC 9.7 10.0 10.7*  NEUTROABS 6.6  --   --   HGB 10.0* 8.9* 9.3*  HCT 32.8* 27.3* 28.9*  MCV 76.8* 74.6* 74.5*  PLT 235 210 187   Cardiac Enzymes: No results for input(s): CKTOTAL, CKMB, CKMBINDEX, TROPONINI in the last 168 hours. BNP: Invalid input(s): POCBNP CBG: Recent Labs  Lab 12/21/18 0630 12/21/18 1150 12/21/18 1655 12/21/18 2122 12/22/18 0652  GLUCAP 91 150* 135* 122* 104*   D-Dimer No results for input(s): DDIMER in the last 72 hours. Hgb A1c No results for input(s): HGBA1C in the last 72 hours. Lipid Profile No results for input(s): CHOL, HDL, LDLCALC, TRIG, CHOLHDL, LDLDIRECT in the last 72 hours. Thyroid function studies No results for input(s): TSH, T4TOTAL, T3FREE, THYROIDAB in the last 72 hours.  Invalid input(s): FREET3 Anemia work up No results for input(s): VITAMINB12, FOLATE, FERRITIN, TIBC, IRON, RETICCTPCT in the last 72 hours. Urinalysis    Component Value Date/Time  COLORURINE YELLOW 12/03/2018 1208   APPEARANCEUR HAZY (A) 12/03/2018 1208   LABSPEC 1.020 12/03/2018 1208   PHURINE 6.0 12/03/2018 1208   GLUCOSEU 100 (A) 12/03/2018 1208   HGBUR LARGE (A) 12/03/2018 1208   HGBUR trace-intact 03/08/2010 1452   BILIRUBINUR NEGATIVE 12/03/2018 1208   KETONESUR NEGATIVE 12/03/2018 1208   PROTEINUR >300 (A) 12/03/2018 1208   UROBILINOGEN 0.2 03/08/2010  1452   NITRITE NEGATIVE 12/03/2018 1208   LEUKOCYTESUR TRACE (A) 12/03/2018 1208   Sepsis Labs Invalid input(s): PROCALCITONIN,  WBC,  LACTICIDVEN Microbiology Recent Results (from the past 240 hour(s))  MRSA PCR Screening     Status: None   Collection Time: 12/19/18  6:18 AM  Result Value Ref Range Status   MRSA by PCR NEGATIVE NEGATIVE Final    Comment:        The GeneXpert MRSA Assay (FDA approved for NASAL specimens only), is one component of a comprehensive MRSA colonization surveillance program. It is not intended to diagnose MRSA infection nor to guide or monitor treatment for MRSA infections. Performed at [facility], [place] [address], [place],  [address]    Time spent: 44min  SIGNED:   Marylu Lund, MD  [facility] 12/22/2018, 11:00 AM  If 7PM-7AM, please contact night-coverage

## 2018-12-22 NOTE — Progress Notes (Signed)
OP HD Clinic Manager notified of planned hospital discharge today with next scheduled HD treatment tomorrow in-center. Dialysis Coordinator notes documentation in hospital record of patient's homelessness. Dialysis Coordinator communicated this to OP HD MSW for continuity of care.  Alphonzo Cruise Dialysis Coordinator [phone]

## 2018-12-22 NOTE — Discharge Instructions (Signed)
Homeless Shelter: [facility]  [place], [place] [address]

## 2018-12-22 NOTE — Progress Notes (Addendum)
[facility] Progress Note   Subjective: C/O abdominal pain. Says her diarrhea has improved. Oriented X 3 today.   Objective Vitals:   12/21/18 2120 12/22/18 0532 12/22/18 0719 12/22/18 0846  BP: 133/66 (!) 153/126 132/66 113/67  Pulse: 99 97 89 (!) 109  Resp: 19 20  18   Temp: 98.9 F (37.2 C) 98.9 F (37.2 C)  98.4 F (36.9 C)  TempSrc: Oral Oral  Oral  SpO2: 91% 93%  91%  Weight: 56.3 kg     Height:       Physical Exam General: Chronically ill appearing older female in NAD Heart: S1,S2 RRR Lungs: CTAB A/P Abdomen: tender to palpation. Active BS Extremities: No LE edema.  Dialysis Access: L AVG + bruit. RIJ TDC DRSG CDI.   Additional Objective Labs: Basic Metabolic Panel: Recent Labs  Lab 12/18/18 1838 12/19/18 0031 12/20/18 0304  NA 137 135 136  K 5.6* 5.5* 4.7  CL 102 101 98  CO2 17* 16* 23  GLUCOSE 104* 87 91  BUN 78* 81* 22  CREATININE 15.10* 15.60* 7.02*  CALCIUM 7.8* 7.5* 8.0*   Liver Function Tests: Recent Labs  Lab 12/18/18 1838  AST 26  ALT 15  ALKPHOS 87  BILITOT 0.6  PROT 7.5  ALBUMIN 2.8*   Recent Labs  Lab 12/18/18 1838  LIPASE 55*   CBC: Recent Labs  Lab 12/18/18 1838 12/19/18 0031 12/20/18 0304  WBC 9.7 10.0 10.7*  NEUTROABS 6.6  --   --   HGB 10.0* 8.9* 9.3*  HCT 32.8* 27.3* 28.9*  MCV 76.8* 74.6* 74.5*  PLT 235 210 187   Blood Culture    Component Value Date/Time   SDES BLOOD RIGHT WRIST 12/03/2018 1213   SPECREQUEST  12/03/2018 1213    BOTTLES DRAWN AEROBIC AND ANAEROBIC Blood Culture results may not be optimal due to an inadequate volume of blood received in culture bottles   CULT  12/03/2018 1213    NO GROWTH 5 DAYS Performed at [facility], [place] [address] [place], [place] [address]    REPTSTATUS 12/08/2018 FINAL 12/03/2018 1213    Cardiac Enzymes: No results for input(s): CKTOTAL, CKMB, CKMBINDEX, TROPONINI in the last 168 hours. CBG: Recent Labs  Lab 12/21/18 0630 12/21/18 1150  12/21/18 1655 12/21/18 2122 12/22/18 0652  GLUCAP 91 150* 135* 122* 104*   Iron Studies: No results for input(s): IRON, TIBC, TRANSFERRIN, FERRITIN in the last 72 hours. @lablastinr3 @ Studies/Results: Dg Chest 2 View  Result Date: 12/20/2018 CLINICAL DATA:  Shortness of breath. Acute hypoxic respiratory failure. EXAM: CHEST - 2 VIEW COMPARISON:  Radiograph 12/18/2018 FINDINGS: Right dialysis catheter remains in place. Unchanged cardiomegaly and mediastinal contours. Fine interstitial opacities throughout both lungs, slightly worsened on the left since prior exam. Peripheral opacity at the right lung base is unchanged. No large pleural effusion or pneumothorax. IMPRESSION: Fine interstitial opacities throughout both lungs, slightly worsened on the left since prior exam. This may reflect pulmonary edema or atypical infection. Cardiomegaly is stable. Electronically Signed   By: Keith Rake M.D.   On: 12/20/2018 20:19   Medications: . sodium chloride    . sodium chloride     . amLODipine  5 mg Oral QHS  . Chlorhexidine Gluconate Cloth  6 each Topical Q0600  . darbepoetin (ARANESP) injection - DIALYSIS  100 mcg Intravenous Q Sat-HD  . doxercalciferol  4 mcg Intravenous Q T,Th,Sa-HD  . fluticasone  2 spray Each Nare Daily  . heparin injection (subcutaneous)  5,000 Units Subcutaneous Q8H  . insulin aspart  0-9 Units Subcutaneous TID WC  . insulin glargine  3 Units Subcutaneous QHS  . pantoprazole  40 mg Oral Daily  . sevelamer carbonate  1,600 mg Oral TID WC     Dialysis Orders: [place] T,Th,S 4 hrs 180NRE 400/800 55 kg 2.0 K/ 2.5 Ca  L AVG/RIJ TDC -Heparin 3000 units IV TIW -Hectorol 4 mcg IV TIW -Mircera 100 mcg IV q 2 weeks (last dose 150 mcg IV 11/29/18 Last HGB 10.9 11/29/18)  Assessment/Plan: 1. Acute hypoxic Respiratory failure - seems mostly volume related - EDW lowered significatnly Recent negative SARS Cov-2 test.   2. Intractible N &V, D. Probably combination of uremia  from missed HD and non-compliance with ABX for C-diff colitis. Restarted oral vanc. Improved per patient - compliance with meds as an outpatient may be a challenge.  3. Uremia due to missed treatments since last d/c -corrected with dialysis  4. ESRD - T,Th,S. HD 12/23/18 K+ 4.7. Usual heparin and bath. Should be able to get catheter out this admission - Discussed with Dr. Oneida Alar - VVS to remove [facility] tomorrow am.  AVGG required declot last admission but has been working well since then. 5. Hypertension/volume - BP well controlled after serial HD and volume removed. Well under EDW. Will need new lower EDW on DC.  6. Anemia - HGB 10.7 Rec'd Aranesp 100 mcg IV 12/20/18. Follow HGB.   7. Metabolic bone disease -  Continue VDRA. No binders on OP med list. Continue renvela.P not checked this admission yet 8. Nutrition -Renal/Carb mod diet. Albumin 2.8. Add prostat, renal vits- unintentional weight loss 9. DM-per primary 10.  Noncompliance with dialysis - had not been to dialysis since d/c until she went the day of admission then left because  "she couldn't find her purse/" 11. Disp - has unstable social situation/substance use adding to medical compliance challenges - refer to SW for addiction issues. Pt is homeless. MSW consulted. DC today.   Rita H. Brown NP-C 12/22/2018, 10:20 AM  [facility] [phone]  Pt seen, examined and agree w A/P as above.  [facility] 12/22/2018, 3:09 PM

## 2018-12-22 NOTE — Progress Notes (Signed)
Patient discharged. After visit Summary reviewed. Patient capable of reverbalizing medications and follow up visits. No signs and symptoms of distress noted. Patient educated to return to the ED in the case of an emergency. Dillon Bjork RN

## 2018-12-22 NOTE — TOC Transition Note (Signed)
Transition of Care [facility]) - CM/SW Discharge Note   Patient Details  Name: Rhonda Lynch MRN: [id number] Date of Birth: 1956-06-11  Transition of Care [facility] CM/SW Contact:  Bartholomew Crews, RN Phone Number: [phone] 12/22/2018, 1:14 PM   Clinical Narrative:    Spoke with patient at the bedside. She stated that she is homeless and has nowhere to go after discharge. Discussed going to [facility] which is now set up as homeless shelter at this time. Patient is agreeable to this. Address provided on AVS. Patient stated that she has a ride.    Final next level of care: Homeless Shelter Barriers to Discharge: No Barriers Identified   Patient Goals and CMS Choice        Discharge Placement                       Discharge Plan and Services                DME Arranged: N/A DME Agency: NA HH Arranged: NA HH Agency: NA   Social Determinants of Health (SDOH) Interventions     Readmission Risk Interventions No flowsheet data found.

## 2018-12-22 NOTE — Progress Notes (Signed)
[facility] Catheter Removal Procedure Note    S/P thrombectomy left AV graft.  Left AV graft is functioning without problems.  We have been asked to remove the right TDC.   Diagnosis: ESRD with Functioning AVF/AVGG  Plan:  Remove right diatek catheter  Consent signed:  yes Time out completed:  yes Coumadin:  No. PT/INR (if applicable):   Other labs:   Procedure: 1.  Sterile prepping and draping over catheter area 2. 5 ml 2% lidocaine plain instilled at removal site. 3.  right catheter removed in its entirety with cuff in tact. 4.  Complications: none  5. Tip of catheter sent for culture:  no   Patient tolerated procedure well:  yes Pressure held, no bleeding noted, dressing applied Instructions given to the pt regarding wound care and bleeding.  Other:  Roxy Horseman 12/22/2018 1:49 PM

## 2018-12-28 ENCOUNTER — Observation Stay (HOSPITAL_COMMUNITY): Payer: Medicare Other

## 2018-12-28 ENCOUNTER — Emergency Department (HOSPITAL_COMMUNITY): Payer: Medicare Other

## 2018-12-28 ENCOUNTER — Encounter (HOSPITAL_COMMUNITY): Payer: Self-pay | Admitting: Emergency Medicine

## 2018-12-28 ENCOUNTER — Other Ambulatory Visit: Payer: Self-pay

## 2018-12-28 ENCOUNTER — Observation Stay (HOSPITAL_COMMUNITY)
Admission: EM | Admit: 2018-12-28 | Discharge: 2018-12-29 | Disposition: A | Discharge status: Home / Self Care | Payer: Medicare Other | Attending: Internal Medicine | Admitting: Internal Medicine

## 2018-12-28 DIAGNOSIS — Z992 Dependence on renal dialysis: Secondary | ICD-10-CM | POA: Diagnosis present

## 2018-12-28 DIAGNOSIS — E875 Hyperkalemia: Secondary | ICD-10-CM | POA: Diagnosis not present

## 2018-12-28 DIAGNOSIS — N186 End stage renal disease: Secondary | ICD-10-CM | POA: Diagnosis not present

## 2018-12-28 DIAGNOSIS — I10 Essential (primary) hypertension: Secondary | ICD-10-CM | POA: Diagnosis present

## 2018-12-28 DIAGNOSIS — F1490 Cocaine use, unspecified, uncomplicated: Secondary | ICD-10-CM | POA: Diagnosis not present

## 2018-12-28 DIAGNOSIS — D631 Anemia in chronic kidney disease: Secondary | ICD-10-CM | POA: Insufficient documentation

## 2018-12-28 DIAGNOSIS — F17210 Nicotine dependence, cigarettes, uncomplicated: Secondary | ICD-10-CM | POA: Diagnosis not present

## 2018-12-28 DIAGNOSIS — Z886 Allergy status to analgesic agent status: Secondary | ICD-10-CM | POA: Insufficient documentation

## 2018-12-28 DIAGNOSIS — I120 Hypertensive chronic kidney disease with stage 5 chronic kidney disease or end stage renal disease: Secondary | ICD-10-CM | POA: Diagnosis not present

## 2018-12-28 DIAGNOSIS — D509 Iron deficiency anemia, unspecified: Secondary | ICD-10-CM | POA: Diagnosis not present

## 2018-12-28 DIAGNOSIS — Z79899 Other long term (current) drug therapy: Secondary | ICD-10-CM | POA: Diagnosis not present

## 2018-12-28 DIAGNOSIS — R197 Diarrhea, unspecified: Secondary | ICD-10-CM | POA: Diagnosis present

## 2018-12-28 DIAGNOSIS — R109 Unspecified abdominal pain: Secondary | ICD-10-CM

## 2018-12-28 DIAGNOSIS — Z9115 Patient's noncompliance with renal dialysis: Secondary | ICD-10-CM | POA: Diagnosis not present

## 2018-12-28 DIAGNOSIS — Z7289 Other problems related to lifestyle: Secondary | ICD-10-CM | POA: Diagnosis not present

## 2018-12-28 DIAGNOSIS — R112 Nausea with vomiting, unspecified: Secondary | ICD-10-CM

## 2018-12-28 DIAGNOSIS — B1920 Unspecified viral hepatitis C without hepatic coma: Secondary | ICD-10-CM | POA: Diagnosis not present

## 2018-12-28 DIAGNOSIS — Z8673 Personal history of transient ischemic attack (TIA), and cerebral infarction without residual deficits: Secondary | ICD-10-CM | POA: Diagnosis not present

## 2018-12-28 DIAGNOSIS — Z794 Long term (current) use of insulin: Secondary | ICD-10-CM | POA: Diagnosis not present

## 2018-12-28 DIAGNOSIS — R1013 Epigastric pain: Secondary | ICD-10-CM | POA: Diagnosis not present

## 2018-12-28 DIAGNOSIS — E1122 Type 2 diabetes mellitus with diabetic chronic kidney disease: Secondary | ICD-10-CM | POA: Insufficient documentation

## 2018-12-28 DIAGNOSIS — K5900 Constipation, unspecified: Secondary | ICD-10-CM | POA: Diagnosis not present

## 2018-12-28 DIAGNOSIS — R1010 Upper abdominal pain, unspecified: Principal | ICD-10-CM | POA: Insufficient documentation

## 2018-12-28 DIAGNOSIS — Z888 Allergy status to other drugs, medicaments and biological substances status: Secondary | ICD-10-CM | POA: Diagnosis not present

## 2018-12-28 DIAGNOSIS — IMO0001 Reserved for inherently not codable concepts without codable children: Secondary | ICD-10-CM | POA: Diagnosis present

## 2018-12-28 DIAGNOSIS — E119 Type 2 diabetes mellitus without complications: Secondary | ICD-10-CM

## 2018-12-28 LAB — COMPREHENSIVE METABOLIC PANEL
ALT: 24 U/L (ref 0–44)
AST: 36 U/L (ref 15–41)
Albumin: 2.5 g/dL — ABNORMAL LOW (ref 3.5–5.0)
Alkaline Phosphatase: 112 U/L (ref 38–126)
Anion gap: 10 (ref 5–15)
BUN: 29 mg/dL — ABNORMAL HIGH (ref 8–23)
CO2: 25 mmol/L (ref 22–32)
Calcium: 8.8 mg/dL — ABNORMAL LOW (ref 8.9–10.3)
Chloride: 95 mmol/L — ABNORMAL LOW (ref 98–111)
Creatinine, Ser: 7.86 mg/dL — ABNORMAL HIGH (ref 0.44–1.00)
GFR calc Af Amer: 6 mL/min — ABNORMAL LOW (ref >60)
GFR calc non Af Amer: 5 mL/min — ABNORMAL LOW (ref >60)
Glucose, Bld: 96 mg/dL (ref 70–99)
Potassium: 5.9 mmol/L — ABNORMAL HIGH (ref 3.5–5.1)
Sodium: 130 mmol/L — ABNORMAL LOW (ref 135–145)
Total Bilirubin: 0.6 mg/dL (ref 0.3–1.2)
Total Protein: 6.8 g/dL (ref 6.5–8.1)

## 2018-12-28 LAB — CBG MONITORING, ED: Glucose-Capillary: 77 mg/dL (ref 70–99)

## 2018-12-28 LAB — CBC
HCT: 29.1 % — ABNORMAL LOW (ref 36.0–46.0)
Hemoglobin: 8.8 g/dL — ABNORMAL LOW (ref 12.0–15.0)
MCH: 23.7 pg — ABNORMAL LOW (ref 26.0–34.0)
MCHC: 30.2 g/dL (ref 30.0–36.0)
MCV: 78.4 fL — ABNORMAL LOW (ref 80.0–100.0)
Platelets: 254 10*3/uL (ref 150–400)
RBC: 3.71 MIL/uL — ABNORMAL LOW (ref 3.87–5.11)
RDW: 21 % — ABNORMAL HIGH (ref 11.5–15.5)
WBC: 8.7 10*3/uL (ref 4.0–10.5)
nRBC: 0 % (ref 0.0–0.2)

## 2018-12-28 LAB — GLUCOSE, CAPILLARY
Glucose-Capillary: 114 mg/dL — ABNORMAL HIGH (ref 70–99)
Glucose-Capillary: 118 mg/dL — ABNORMAL HIGH (ref 70–99)

## 2018-12-28 MED ORDER — DIPHENHYDRAMINE HCL 2 % EX CREA: 1.0000 "application " | TOPICAL_CREAM | Freq: Three times a day (TID) | CUTANEOUS | Status: DC | PRN

## 2018-12-28 MED ORDER — ONDANSETRON HCL 4 MG/2ML IJ SOLN: 4.0000 mg | Freq: Four times a day (QID) | INTRAMUSCULAR | Status: DC | PRN

## 2018-12-28 MED ORDER — ONDANSETRON HCL 4 MG/2ML IJ SOLN
4.0000 mg | Freq: Once | INTRAMUSCULAR | Status: DC
  Filled 2018-12-28: qty 2

## 2018-12-28 MED ORDER — INSULIN GLARGINE 100 UNIT/ML ~~LOC~~ SOLN
5.0000 [IU] | Freq: Every day | SUBCUTANEOUS | Status: DC
  Administered 2018-12-28: 5 [IU] via SUBCUTANEOUS
  Filled 2018-12-28 (×2): qty 0.05

## 2018-12-28 MED ORDER — SEVELAMER CARBONATE 800 MG PO TABS
1600.0000 mg | ORAL_TABLET | Freq: Three times a day (TID) | ORAL | Status: DC
  Administered 2018-12-28 – 2018-12-29 (×3): 1600 mg via ORAL
  Filled 2018-12-28 (×3): qty 2

## 2018-12-28 MED ORDER — MORPHINE SULFATE (PF) 2 MG/ML IV SOLN
1.0000 mg | INTRAVENOUS | Status: DC | PRN
  Administered 2018-12-29: 1 mg via INTRAVENOUS
  Filled 2018-12-28: qty 1

## 2018-12-28 MED ORDER — SODIUM ZIRCONIUM CYCLOSILICATE 10 G PO PACK
10.0000 g | PACK | Freq: Once | ORAL | Status: AC
  Administered 2018-12-28: 14:00:00 10 g via ORAL
  Filled 2018-12-28: qty 1

## 2018-12-28 MED ORDER — INSULIN ASPART 100 UNIT/ML ~~LOC~~ SOLN
3.0000 [IU] | Freq: Three times a day (TID) | SUBCUTANEOUS | Status: DC
  Administered 2018-12-29 (×2): 3 [IU] via SUBCUTANEOUS

## 2018-12-28 MED ORDER — INSULIN ASPART 100 UNIT/ML ~~LOC~~ SOLN: 0.0000 [IU] | Freq: Three times a day (TID) | SUBCUTANEOUS | Status: DC

## 2018-12-28 MED ORDER — SODIUM CHLORIDE 0.9% FLUSH
3.0000 mL | Freq: Two times a day (BID) | INTRAVENOUS | Status: DC
  Administered 2018-12-28 – 2018-12-29 (×2): 3 mL via INTRAVENOUS

## 2018-12-28 MED ORDER — CALCITRIOL 0.25 MCG PO CAPS
0.2500 ug | ORAL_CAPSULE | ORAL | Status: DC
  Administered 2018-12-29: 0.25 ug via ORAL

## 2018-12-28 MED ORDER — METOCLOPRAMIDE HCL 5 MG/ML IJ SOLN
10.0000 mg | Freq: Once | INTRAMUSCULAR | Status: AC
  Administered 2018-12-28: 10:00:00 10 mg via INTRAVENOUS
  Filled 2018-12-28: qty 2

## 2018-12-28 MED ORDER — AMLODIPINE BESYLATE 5 MG PO TABS
5.0000 mg | ORAL_TABLET | Freq: Every day | ORAL | Status: DC
  Administered 2018-12-28: 22:00:00 5 mg via ORAL
  Filled 2018-12-28: qty 1

## 2018-12-28 MED ORDER — CHLORHEXIDINE GLUCONATE CLOTH 2 % EX PADS
6.0000 | MEDICATED_PAD | Freq: Every day | CUTANEOUS | Status: DC
  Administered 2018-12-29: 06:00:00 6 via TOPICAL

## 2018-12-28 MED ORDER — PANTOPRAZOLE SODIUM 40 MG PO TBEC
40.0000 mg | DELAYED_RELEASE_TABLET | Freq: Every day | ORAL | Status: DC
  Administered 2018-12-29: 40 mg via ORAL
  Filled 2018-12-28: qty 1

## 2018-12-28 MED ORDER — DIALYVITE 800-ZINC 15 0.8 MG PO TABS: 1.0000 | ORAL_TABLET | Freq: Every day | ORAL | Status: DC

## 2018-12-28 MED ORDER — HEPARIN SODIUM (PORCINE) 5000 UNIT/ML IJ SOLN
5000.0000 [IU] | Freq: Three times a day (TID) | INTRAMUSCULAR | Status: DC
  Administered 2018-12-28 – 2018-12-29 (×3): 5000 [IU] via SUBCUTANEOUS
  Filled 2018-12-28 (×3): qty 1

## 2018-12-28 MED ORDER — FLUTICASONE PROPIONATE 50 MCG/ACT NA SUSP
2.0000 | Freq: Every day | NASAL | Status: DC
  Administered 2018-12-29: 2 via NASAL
  Filled 2018-12-28: qty 16

## 2018-12-28 MED ORDER — OXYCODONE HCL 5 MG PO TABS
5.0000 mg | ORAL_TABLET | Freq: Four times a day (QID) | ORAL | Status: DC | PRN
  Administered 2018-12-28 – 2018-12-29 (×3): 5 mg via ORAL
  Filled 2018-12-28 (×3): qty 1

## 2018-12-28 MED ORDER — SODIUM CHLORIDE 0.9 % IV BOLUS
250.0000 mL | Freq: Once | INTRAVENOUS | Status: AC
  Administered 2018-12-28: 10:00:00 250 mL via INTRAVENOUS

## 2018-12-28 MED ORDER — INSULIN ASPART 100 UNIT/ML ~~LOC~~ SOLN: 0.0000 [IU] | Freq: Every day | SUBCUTANEOUS | Status: DC

## 2018-12-28 MED ORDER — MORPHINE SULFATE (PF) 4 MG/ML IV SOLN
4.0000 mg | Freq: Once | INTRAVENOUS | Status: AC
  Administered 2018-12-28: 10:00:00 4 mg via INTRAVENOUS
  Filled 2018-12-28: qty 1

## 2018-12-28 MED ORDER — METOCLOPRAMIDE HCL 5 MG/5ML PO SOLN
10.0000 mg | Freq: Three times a day (TID) | ORAL | Status: AC
  Administered 2018-12-28 – 2018-12-29 (×3): 10 mg via ORAL
  Filled 2018-12-28 (×4): qty 10

## 2018-12-28 NOTE — Progress Notes (Signed)
  Pt orientation to unit, room and routine. Information packet given to patient/family and safety video watched.  Admission INP armband ID verified with patient/family, and in place. SR up x 2, fall risk assessment complete with Patient and family verbalizing understanding of risks associated with falls. Pt verbalizes an understanding of how to use the call bell and to call for help before getting out of bed.  Skin, clean-dry- intact without evidence of bruising, or skin tears.   No evidence of skin break down noted on exam.  Will cont to monitor and assist as needed.  Hosie Spangle, RN 12/28/2018 4:41 PM

## 2018-12-28 NOTE — ED Triage Notes (Addendum)
Patient in via Rhonda Lynch from home c/o N/V/D and abdominal pain since yesterday. Recently discharged from hospital 6 days ago following admission for pneumonia and CHF. COVID negative. Patient has dialysis T/TH/Sa - only received 1/2 of dialysis yesterday d/t symptoms worsening. Denies fevers/chills. She also endorses slight shortness of breath this morning.  EMS VS: HR 84 sinus rhythm, 140/60, 98.51F temporal, CBG 79. Resp e/u, skin w/d.

## 2018-12-28 NOTE — Progress Notes (Addendum)
[facility] Quick Note:  S: Notified by ED that patient has presented with N/V and diarrhea. She is well known to our service and notoriously non-compliant with her dialysis treatments. Just discharged 4/13 from [facility]. She was dialyzed as outpatient yesterday, but signed off the machine early (ran 1:58 of 4hr session).  She is being admitted OBS status. Plan is for dialysis in house tomorrow AM.   O: Blood pressure (!) 198/85, pulse 82, temperature 97.9 F (36.6 C), temperature source Oral, resp. rate 20, height 5\' 1"  (1.549 m), weight 59 kg, SpO2 96 %. General: Chronically ill appearing older female in NAD Heart: S1,S2 RRR Lungs: CTAB A/P Abdomen: soft, nondistended, Active BS Extremities: No LE edema.  Dialysis Access: L AVG + bruit  Labs: Na 130, K 5.9, CO2 25, Ca 8.8, WBC 8.7, Hgb 8.8, Plts 254   CXR with mild IS pulm edema   A/P:  ESRD: No urgent need for HD today - will dialyze tomorrow morning.  Hyperkalemia - mild at 5.9 without EKG changes. Lokelma x 1 ordered.  Will do full consult if changed to full admission status.   Veneta Penton, PA-C [employer] Pager [phone]  Pt seen, examined and agree w A/P as above.  [facility] 12/28/2018, 9:28 PM

## 2018-12-28 NOTE — H&P (Signed)
History and Physical    MCKINZEY ENTWISTLE JKK:[id number] DOB: October 24, 1955 DOA: 12/28/2018  Referring MD/NP/PA:   PCP: Clent Demark, PA-C   Patient coming from:  The patient is coming from home.  At baseline, pt is independent for most of ADL.        Chief Complaint: Abdominal pain, nausea vomiting and diarrhea for 2 days  HPI: Rhonda Lynch is a 63 y.o. female with medical history significant of HTN, DM, ESRD on HD TTS, Anemia, medication/HD noncompliance, h/o d-diff, presented to the ED with abdominal pain, nausea vomiting and diarrhea.  Patient states that she had the problem for a long time, multiple visits and last admission for similar problem, discharged on 4/13. She was positive for c-diff on 3/29 and treated with oral vancomycin.  She was tested negative for COVID on 3/25. Her symptoms worse since yesterday.  Her pain is located at mid upper abdomen, sharp, 9/10 when severe, no radiation, persistent, no significant worsening or relieving factors. Associated with nausea and vomit.  Had nonbloody loose stool 3 times yesterday and 4 times this morning.  Non-bloody non-bilious vomiting once yesterday and once today.  She only had partial dialysis yesterday due to vomit and diarrhea. Patient states has not been eating much since yesterday.  She denies fever, chills, chest pain, SOB, palpitation, leg swelling or focal weakness.  ED Course: pt was afebrile, no tachycardia, BP elevated. Labs significant for NA 130, K5.9, EKG no significant tented T waves, BUN/Cr 29/7.86, Hb 8.8. CXR showed mild pulmonary edema. Pt was given 529ml IVF bolus and IV morphine, Zofran and Reglan with significant improvement in abdominal pain, no nausea/vomit or diarrhea in the ED.  Patient c/o hungry and insists to eat, otherwise will leave the hosptial.  Pt was gvien Lokelma. Nephrology consulted and will do dialysis today.  Review of Systems:   General: no fevers, chills, no body weight gain, has poor appetite  HEENT: no blurry vision, hearing changes or sore throat Respiratory: no dyspnea, coughing, wheezing CV: no chest pain, no palpitations GI: nausea, vomiting, abdominal pain, diarrhea, no constipation GU: no dysuria, burning on urination, increased urinary frequency, hematuria  Ext: no leg edema Neuro: no unilateral weakness, numbness, or tingling, no vision change or hearing loss Skin: no rash, no skin tear. MSK: No muscle spasm, no deformity, no limitation of range of movement in spin Heme: No easy bruising.  Travel history: No recent long distant travel.  Allergy:  Allergies  Allergen Reactions  . Acetaminophen Nausea And Vomiting    Patient states Acetaminophen and Acetaminophen containing products make her nauseated. She demonstrated this 06/21/18 with nausea followed by emesis.  Marland Kitchen Hydroxyzine Itching    Past Medical History:  Diagnosis Date  . Diabetes mellitus without complication ([place])   . ESRD (end stage renal disease) on dialysis ([place])    "TTS; [place]; they're moving me to another one" (09/22/2018)  . Hepatitis C    diagnosed 09/2018  . Hypertension   . Oxygen deficiency    2 L at night  . Stroke ([place])   . Substance abuse ([place])    has been clean for 4 months     Past Surgical History:  Procedure Laterality Date  . AV FISTULA PLACEMENT Left 03/05/2018   Procedure: ARTERIOVENOUS (AV) FISTULA CREATION  LEFT UPPER EXTREMITY;  Surgeon: [name], MD;  Location: [facility] OR;  Service: Vascular;  Laterality: Left;  . AV FISTULA PLACEMENT Left 07/21/2018   Procedure: ARTERIOVENOUS (AV)  FISTULA CREATION;  Surgeon: Marty Heck, MD;  Location[facility];  Service: Vascular;  Laterality: Left;  . AV FISTULA PLACEMENT Left 10/27/2018   Procedure: INSERTION OF ARTERIOVENOUS (AV) GORE-TEX GRAFT UPPER ARM;  Surgeon: Marty Heck, MD;  Location: [place];  Service: Vascular;  Laterality: Left;  . BASCILIC VEIN TRANSPOSITION Left 09/29/2018   Procedure: BASILIC VEIN  TRANSPOSITION SECOND STAGE LEFT UPPER ARM;  Surgeon: Marty Heck, MD;  Location: [place];  Service: Vascular;  Laterality: Left;  . HEMATOMA EVACUATION Left 03/06/2018   Procedure: EVACUATION HEMATOMA LEFT ARM;  Surgeon: Angelia Mould, MD;  Location: [place];  Service: Vascular;  Laterality: Left;  . I&D EXTREMITY Left 10/06/2018   Procedure: IRRIGATION AND DEBRIDEMENT ARM;  Surgeon: Marty Heck, MD;  Location: [place];  Service: Vascular;  Laterality: Left;  . INSERTION OF DIALYSIS CATHETER Left 04/29/2018   Procedure: INSERTION OF DIALYSIS CATHETER LEFT INTERNAL JUGULAR;  Surgeon: Angelia Mould, MD;  Location: [place];  Service: Vascular;  Laterality: Left;  . IR FLUORO GUIDE CV LINE RIGHT  02/18/2018  . IR FLUORO GUIDE CV LINE RIGHT  02/26/2018  . IR REMOVAL TUN CV CATH W/O FL  04/27/2018  . IR US GUIDE VASC ACCESS RIGHT  02/18/2018  . TEE WITHOUT CARDIOVERSION N/A 04/29/2018   Procedure: TRANSESOPHAGEAL ECHOCARDIOGRAM (TEE);  Surgeon: Sueanne Margarita, MD;  [facility] ENDOSCOPY;  Service: Cardiovascular;  Laterality: N/A;  . THROMBECTOMY AND REVISION OF ARTERIOVENTOUS (AV) GORETEX  GRAFT Left 12/11/2018   Procedure: THROMBECTOMY AND  REVISION  ARTERIOVENTOUS GORETEX  GRAFT LEFT ARM;  Surgeon: [name], MD;  Location: [place];  Service: Vascular;  Laterality: Left;  Marland Kitchen VENOGRAM Left 10/27/2018   Procedure: LEFT CENTRAL VENOGRAM;  Surgeon: Marty Heck, MD;  Location: [place];  Service: Vascular;  Laterality: Left;    Social History:  reports that she has been smoking cigarettes. She has a 24.00 pack-year smoking history. She has never used smokeless tobacco. She reports previous alcohol use. She reports current drug use. Drugs: Cocaine and Heroin.  Family History:  Family History  Problem Relation Age of Onset  . Autoimmune disease Neg Hx      Prior to Admission medications   Medication Sig Start Date End Date Taking? Authorizing Provider  amLODipine  (NORVASC) 5 MG tablet Take 1 tablet (5 mg total) by mouth at bedtime. 10/13/18  Yes Alphonzo Grieve, MD  B Complex-C-Zn-Folic Acid (DIALYVITE 628-BTDV 15) 0.8 MG TABS Take 1 tablet by mouth daily. 11/04/18  Yes [provider]  calcitRIOL (ROCALTROL) 0.25 MCG capsule Take 1 capsule (0.25 mcg total) by mouth every Monday, Wednesday, and Friday with hemodialysis. Patient taking differently: Take 0.25 mcg by mouth every Tuesday, Thursday, and Saturday at 6 PM.  10/29/18  Yes Isabelle Course, MD  diphenhydrAMINE (BENADRYL) 2 % cream Apply 1 application topically 3 (three) times daily as needed for itching.   Yes [provider]  fluticasone (FLONASE) 50 MCG/ACT nasal spray Place 2 sprays into both nostrils daily.   Yes [provider]  insulin aspart (NOVOLOG) 100 UNIT/ML injection Inject 5 Units into the skin 3 (three) times daily with meals. Patient taking differently: Inject 0-9 Units into the skin 3 (three) times daily with meals.  10/13/18  Yes Alphonzo Grieve, MD  insulin glargine (LANTUS) 100 UNIT/ML injection Inject 0.03 mLs (3 Units total) into the skin at bedtime. Patient taking differently: Inject 5 Units into the skin at  bedtime.  10/29/18  Yes Isabelle Course, MD  pantoprazole (PROTONIX) 40 MG tablet Take 1 tablet (40 mg total) by mouth daily. 11/21/18  Yes Ghimire, Henreitta Leber, MD  sevelamer carbonate (RENVELA) 800 MG tablet Take 2 tablets (1,600 mg total) by mouth 3 (three) times daily with meals. 11/26/18  Yes Dana Allan I, MD  Blood Glucose Monitoring Suppl (ACCU-CHEK AVIVA) device Use as instructed 10/29/18 10/29/19  Isabelle Course, MD  glucose blood (ACCU-CHEK AVIVA) test strip Use twice per day. 10/29/18   Isabelle Course, MD  Insulin Syringe-Needle U-100 (INSULIN SYRINGE .3CC/31GX5/16") 31G X 5/16" 0.3 ML MISC Inject 1 each into the skin 4 (four) times daily. 06/27/18   Clent Demark, PA-C  Lancets (ACCU-CHEK SOFT [facility]) lancets Use twice per day. 10/29/18    Isabelle Course, MD  vancomycin (VANCOCIN) 125 MG capsule Take 1 capsule (125 mg total) by mouth 4 (four) times daily. Patient not taking: Reported on 12/18/2018 12/12/18   Caren Griffins, MD    Physical Exam: Vitals:   12/28/18 0958 12/28/18 1030 12/28/18 1100 12/28/18 1356  BP:  (!) 156/75 (!) 174/73 (!) 198/85  Pulse:  88 74 82  Resp:  (!) 26 19 20   Temp:      TempSrc:      SpO2:  100% 98% 96%  Weight: 59 kg     Height: 5\' 1"  (1.549 m)      General: Not in acute distress HEENT:       Eyes: PERRL, EOMI, no scleral icterus.       ENT: No discharge from the ears and nose, no pharynx injection, no tonsillar enlargement.        Neck: No JVD, Heme: No neck lymph node enlargement. Cardiac: S1/S2, RRR, No murmurs, No gallops or rubs. Respiratory: Good air movement bilaterally. No rales, wheezing, rhonchi or rubs. GI: Soft, nondistended, mild tenderness at epigastric region and mid abdomen, no rebound pain, no organomegaly, BS present. Ext: No pitting leg edema bilaterally.  Musculoskeletal: No joint deformities, No joint redness or warmth, no limitation of ROM in spin. Skin: No rashes.  Neuro: Alert, oriented X3, cranial nerves II-XII grossly intact, moves all extremities normally. Muscle strength 5/5 in all extremities, sensation to light touch intact. Brachial reflex 2+ bilaterally. Knee reflex 1+ bilaterally. Negative Babinski's sign. Normal finger to nose test. Psych: Patient is not psychotic, no suicidal or hemocidal ideation.  Labs on Admission: I have personally reviewed following labs and imaging studies  CBC: Recent Labs  Lab 12/28/18 1023  WBC 8.7  HGB 8.8*  HCT 29.1*  MCV 78.4*  PLT 063   Basic Metabolic Panel: Recent Labs  Lab 12/28/18 1023  NA 130*  K 5.9*  CL 95*  CO2 25  GLUCOSE 96  BUN 29*  CREATININE 7.86*  CALCIUM 8.8*   GFR: Estimated Creatinine Clearance: 5.9 mL/min (A) (by C-G formula based on SCr of 7.86 mg/dL (H)). Liver Function Tests:  Recent Labs  Lab 12/28/18 1023  AST 36  ALT 24  ALKPHOS 112  BILITOT 0.6  PROT 6.8  ALBUMIN 2.5*   No results for input(s): LIPASE, AMYLASE in the last 168 hours. No results for input(s): AMMONIA in the last 168 hours. Coagulation Profile: No results for input(s): INR, PROTIME in the last 168 hours. Cardiac Enzymes: No results for input(s): CKTOTAL, CKMB, CKMBINDEX, TROPONINI in the last 168 hours. BNP (last 3 results) No results for input(s): PROBNP in the last 8760 hours. HbA1C:  No results for input(s): HGBA1C in the last 72 hours. CBG: Recent Labs  Lab 12/21/18 1655 12/21/18 2122 12/22/18 0652 12/22/18 1155 12/28/18 1350  GLUCAP 135* 122* 104* 160* 77   Lipid Profile: No results for input(s): CHOL, HDL, LDLCALC, TRIG, CHOLHDL, LDLDIRECT in the last 72 hours. Thyroid Function Tests: No results for input(s): TSH, T4TOTAL, FREET4, T3FREE, THYROIDAB in the last 72 hours. Anemia Panel: No results for input(s): VITAMINB12, FOLATE, FERRITIN, TIBC, IRON, RETICCTPCT in the last 72 hours. Urine analysis:    Component Value Date/Time   COLORURINE YELLOW 12/03/2018 1208   APPEARANCEUR HAZY (A) 12/03/2018 1208   LABSPEC 1.020 12/03/2018 1208   PHURINE 6.0 12/03/2018 1208   GLUCOSEU 100 (A) 12/03/2018 1208   HGBUR LARGE (A) 12/03/2018 1208   HGBUR trace-intact 03/08/2010 1452   BILIRUBINUR NEGATIVE 12/03/2018 1208   KETONESUR NEGATIVE 12/03/2018 1208   PROTEINUR >300 (A) 12/03/2018 1208   UROBILINOGEN 0.2 03/08/2010 1452   NITRITE NEGATIVE 12/03/2018 1208   LEUKOCYTESUR TRACE (A) 12/03/2018 1208   Sepsis Labs: @LABRCNTIP (procalcitonin:4,lacticidven:4) ) Recent Results (from the past 240 hour(s))  MRSA PCR Screening     Status: None   Collection Time: 12/19/18  6:18 AM  Result Value Ref Range Status   MRSA by PCR NEGATIVE NEGATIVE Final    Comment:        The GeneXpert MRSA Assay (FDA approved for NASAL specimens only), is one component of a comprehensive MRSA  colonization surveillance program. It is not intended to diagnose MRSA infection nor to guide or monitor treatment for MRSA infections. Performed at [facility], [place] [address] [place], [place] [address]      Radiological Exams on Admission: Dg Chest Port 1 View  Result Date: 12/28/2018 CLINICAL DATA:  Dialysis, cough, shortness of breath EXAM: PORTABLE CHEST 1 VIEW COMPARISON:  12/20/2018 FINDINGS: Bilateral mild interstitial thickening. Trace right pleural effusion. No pneumothorax. Stable cardiomegaly. No acute osseous abnormality. IMPRESSION: 1. Mild pulmonary edema. Electronically Signed   By: Kathreen Devoid   On: 12/28/2018 12:15    EKG: Independently reviewed.  Sinus tachycardia, rate 107, LAFB, LVH, no significant tented T waves.   Assessment/Plan Principal Problem:   Abdominal pain Active Problems:   Microcytic anemia   Hypertension   Type 2 diabetes mellitus (HCC)   Hyperkalemia   Diarrhea  Principal Problem:   Abdominal pain Active Problems:   Microcytic anemia   Hypertension   Type 2 diabetes mellitus (HCC)   Hyperkalemia   Diarrhea   ESRD on HD TTS  Rhonda Lynch is a 63 y.o. female with medical history significant of HTN, DM, ESRD on HD TTS, Anemia, medication/HD noncompliance, h/o d-diff, presented with abdominal pain, nausea vomiting and diarrhea for 2 days.    Abdominal pain, nausea vomiting and diarrhea H/o recent c-diff treated with oral vanc Possibly due to acute gastroenteritis vs recurrent C. difficile vs gastroparesis Abdominal x-ray negative Symptomatic control with oxycodone/morphine, scheduled Reglan, prn Zofran and Phenergan C. difficile test ordered in ED We will treat for C. Difficile if positive Will check DUS Low suspicion for COVID-19, patient had a negative COVID-19 on 3/25, no fever or URI symptoms, CXR negative  Hyperkalemia EKG no tent T waves Patient had partial dialysis yesterday Received Lokelma in ED, dialysis is  scheduled today Tele monitoring  Essential HTN: Resume home medications amlodipine Hydralazine prn. Monitor and adjust medications accordingly. Low Sodium diet.  DM  Home on Lantus 5 units and Humalog 3 units with  meals, will continue Monitor sugar finger stick ACHS, give SSI,  Consistent carb diet, f/u HbA1c  Chronic microcytic anemia Hb 8.8, close to baseline Likely due to CKD  DVT ppx: SQ Heparin         Code Status: Full code Family Communication: None at bed side.               Disposition Plan:  Anticipate discharge back to previous home environment Consults called: Nephrology Admission status: Obs / tele        Date of Service 12/28/2018    [facility]   If 7PM-7AM, please contact night-coverage [email or web address] Password [facility] 12/28/2018, 3:01 PM

## 2018-12-28 NOTE — ED Provider Notes (Addendum)
[facility] EMERGENCY DEPARTMENT Provider Note   CSN: [id number] Arrival date & time: 12/28/18  0947    History   Chief Complaint Chief Complaint  Patient presents with   Emesis   Abdominal Pain    HPI Rhonda Lynch is a 63 y.o. female.     Patient c/o nausea, vomiting, diarrhea, and abd cramping since yesterday. Patient reports 4 episodes of vomiting and diarrhea today. Symptoms acute onset, moderate, persistent, felt worse today. Hx esrd on hd t/th/saturday. Had only partial dialysis yesterday. No constant/focal abd pain. No abd distension. No fever or chills. +occasional non prod cough. No sore throat. ?mild sob. No chest pain. Denies leg pain or swelling. No known ill contacts with those with nvd symptoms or those with dx covid-19. No recent travel. States was on antibiotic earlier in month for pna. Also states hx cdiff.   The history is provided by the patient and the EMS personnel.  Emesis  Associated symptoms: abdominal pain, cough and diarrhea   Associated symptoms: no fever, no headaches and no sore throat   Abdominal Pain  Associated symptoms: cough, diarrhea, shortness of breath and vomiting   Associated symptoms: no chest pain, no dysuria, no fever and no sore throat     Past Medical History:  Diagnosis Date   Diabetes mellitus without complication ([place])    ESRD (end stage renal disease) on dialysis ([place])    "TTS; [facility]; they're moving me to another one" (09/22/2018)   Hepatitis C    diagnosed 09/2018   Hypertension    Oxygen deficiency    2 L at night   Stroke [facility])    Substance abuse ([place])    has been clean for 4 months     Patient Active Problem List   Diagnosis Date Noted   Metabolic acidosis, increased anion gap (IAG) 12/19/2018   Volvulus ([place]) 12/19/2018   Hyperkalemia 11/25/2018   Cocaine overdose, accidental or unintentional, initial encounter ([place]) 11/25/2018   CAP (community acquired pneumonia)  11/17/2018   End-stage renal disease on hemodialysis ([place])    Hypoglycemia 10/23/2018   Non-intractable vomiting    Other ascites    Palliative care encounter    Left arm pain    Chronic hepatitis C virus genotype 1b infection ([place]) 09/28/2018   ICH (intracerebral hemorrhage) ([place]) small left caudate due to HTN 07/17/2018   HCAP (healthcare-associated pneumonia) 06/21/2018   Substance induced mood disorder ([place]) 05/15/2018   Agitation    Hemodialysis catheter infection, initial encounter ([place])    Fungemia 04/27/2018   Pulmonary embolism ([place]) 04/27/2018   Sepsis ([place]) 04/26/2018   Dyspnea 04/26/2018   Chest pain 04/26/2018   Tachycardia 04/26/2018   Abdominal pain 04/18/2018   Type 2 diabetes mellitus ([place]) 04/18/2018   Right flank pain 04/18/2018   Acute respiratory failure with hypoxia ([place]) 04/12/2018   SVT (supraventricular tachycardia) (HCC)    Chronic renal failure    Hypertension    Non-compliance with renal dialysis ([place])    Acute pulmonary edema (HCC)    Elevated troponin 03/19/2018   Fever    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[facility])    Diffuse pulmonary alveolar hemorrhage    Hypoxemia    Pulmonary infiltrate    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[place])    Acute respiratory failure ([place]) 02/12/2018   Alcohol abuse 02/12/2018   Microcytic anemia 02/12/2018   Cocaine abuse ([place]) 02/12/2018   Epigastric pain 02/12/2018   Pneumonia 02/12/2018   TOBACCO ABUSE  03/08/2010   PNEUMONIA 03/08/2010    Past Surgical History:  Procedure Laterality Date   AV FISTULA PLACEMENT Left 03/05/2018   Procedure: ARTERIOVENOUS (AV) FISTULA CREATION  LEFT UPPER EXTREMITY;  Surgeon: [name], MD;  Location: [place];  Service: Vascular;  Laterality: Left;   AV FISTULA PLACEMENT Left 07/21/2018   Procedure: ARTERIOVENOUS (AV) FISTULA CREATION;  Surgeon: Marty Heck, MD;  Location: [place];  Service: Vascular;  Laterality:  Left;   AV FISTULA PLACEMENT Left 10/27/2018   Procedure: INSERTION OF ARTERIOVENOUS (AV) GORE-TEX GRAFT UPPER ARM;  Surgeon: Marty Heck, MD;  Location: [place];  Service: Vascular;  Laterality: Left;   [place] Left 09/29/2018   Procedure: BASILIC VEIN TRANSPOSITION SECOND STAGE LEFT UPPER ARM;  Surgeon: Marty Heck, MD;  Location: [place];  Service: Vascular;  Laterality: Left;   HEMATOMA EVACUATION Left 03/06/2018   Procedure: EVACUATION HEMATOMA LEFT ARM;  Surgeon: Angelia Mould, MD;  Location: [place];  Service: Vascular;  Laterality: Left;   I&D EXTREMITY Left 10/06/2018   Procedure: IRRIGATION AND DEBRIDEMENT ARM;  Surgeon: Marty Heck, MD;  Location: [place];  Service: Vascular;  Laterality: Left;   INSERTION OF DIALYSIS CATHETER Left 04/29/2018   Procedure: INSERTION OF DIALYSIS CATHETER LEFT INTERNAL JUGULAR;  Surgeon: Angelia Mould, MD;  Location: [place];  Service: Vascular;  Laterality: Left;   IR FLUORO GUIDE CV LINE RIGHT  02/18/2018   IR FLUORO GUIDE CV LINE RIGHT  02/26/2018   IR REMOVAL TUN CV CATH W/O FL  04/27/2018   IR US GUIDE VASC ACCESS RIGHT  02/18/2018   TEE WITHOUT CARDIOVERSION N/A 04/29/2018   Procedure: TRANSESOPHAGEAL ECHOCARDIOGRAM (TEE);  Surgeon: Sueanne Margarita, MD;  Location: [place];  Service: Cardiovascular;  Laterality: N/A;   THROMBECTOMY AND REVISION OF ARTERIOVENTOUS (AV) GORETEX  GRAFT Left 12/11/2018   Procedure: THROMBECTOMY AND  REVISION  ARTERIOVENTOUS GORETEX  GRAFT LEFT ARM;  Surgeon: [name], MD;  [facility];  Service: Vascular;  Laterality: Left;   VENOGRAM Left 10/27/2018   Procedure: LEFT CENTRAL VENOGRAM;  Surgeon: Marty Heck, MD;  Location: [place];  Service: Vascular;  Laterality: Left;     OB History   No obstetric history on file.      Home Medications    Prior to Admission medications   Medication Sig Start Date End Date Taking? Authorizing Provider    amLODipine (NORVASC) 5 MG tablet Take 1 tablet (5 mg total) by mouth at bedtime. 10/13/18  Yes Alphonzo Grieve, MD  B Complex-C-Zn-Folic Acid (DIALYVITE 149-FWYO 15) 0.8 MG TABS Take 1 tablet by mouth daily. 11/04/18  Yes [provider]  calcitRIOL (ROCALTROL) 0.25 MCG capsule Take 1 capsule (0.25 mcg total) by mouth every Monday, Wednesday, and Friday with hemodialysis. Patient taking differently: Take 0.25 mcg by mouth every Tuesday, Thursday, and Saturday at 6 PM.  10/29/18  Yes Isabelle Course, MD  diphenhydrAMINE (BENADRYL) 2 % cream Apply 1 application topically 3 (three) times daily as needed for itching.   Yes [provider]  fluticasone (FLONASE) 50 MCG/ACT nasal spray Place 2 sprays into both nostrils daily.   Yes [provider]  insulin aspart (NOVOLOG) 100 UNIT/ML injection Inject 5 Units into the skin 3 (three) times daily with meals. Patient taking differently: Inject 0-9 Units into the skin 3 (three) times daily with meals.  10/13/18  Yes Alphonzo Grieve, MD  insulin glargine (LANTUS) 100 UNIT/ML  injection Inject 0.03 mLs (3 Units total) into the skin at bedtime. Patient taking differently: Inject 5 Units into the skin at bedtime.  10/29/18  Yes Isabelle Course, MD  pantoprazole (PROTONIX) 40 MG tablet Take 1 tablet (40 mg total) by mouth daily. 11/21/18  Yes Ghimire, Henreitta Leber, MD  sevelamer carbonate (RENVELA) 800 MG tablet Take 2 tablets (1,600 mg total) by mouth 3 (three) times daily with meals. 11/26/18  Yes Dana Allan I, MD  Blood Glucose Monitoring Suppl (ACCU-CHEK AVIVA) device Use as instructed 10/29/18 10/29/19  Isabelle Course, MD  glucose blood (ACCU-CHEK AVIVA) test strip Use twice per day. 10/29/18   Isabelle Course, MD  Insulin Syringe-Needle U-100 (INSULIN SYRINGE .3CC/31GX5/16") 31G X 5/16" 0.3 ML MISC Inject 1 each into the skin 4 (four) times daily. 06/27/18   Clent Demark, PA-C  Lancets (ACCU-CHEK SOFT [facility] lancets Use twice per day.  10/29/18   Isabelle Course, MD  vancomycin (VANCOCIN) 125 MG capsule Take 1 capsule (125 mg total) by mouth 4 (four) times daily. Patient not taking: Reported on 12/18/2018 12/12/18   Caren Griffins, MD    Family History Family History  Problem Relation Age of Onset   Autoimmune disease Neg Hx     Social History Social History   Tobacco Use   Smoking status: Current Every Day Smoker    Packs/day: 0.50    Years: 48.00    Pack years: 24.00    Types: Cigarettes   Smokeless tobacco: Never Used   Tobacco comment: trying to quit ; smoking 1 1/2 cigarettes/day  (09/22/2018)  Substance Use Topics   Alcohol use: Not Currently    Comment: 09/22/2018 "stopped about 6 months ago"   Drug use: Yes    Types: Cocaine, Heroin    Comment: See notes     Allergies   Acetaminophen and Hydroxyzine   Review of Systems Review of Systems  Constitutional: Negative for fever.  HENT: Negative for sore throat.   Eyes: Negative for redness.  Respiratory: Positive for cough and shortness of breath.   Cardiovascular: Negative for chest pain and leg swelling.  Gastrointestinal: Positive for abdominal pain, diarrhea and vomiting.  Endocrine: Negative for polyuria.  Genitourinary: Negative for dysuria and flank pain.  Musculoskeletal: Negative for back pain and neck pain.  Skin: Negative for rash.  Neurological: Negative for headaches.  Hematological: Does not bruise/bleed easily.  Psychiatric/Behavioral: Negative for confusion.     Physical Exam Updated Vital Signs BP (!) 174/73    Pulse 74    Temp 97.9 F (36.6 C) (Oral)    Resp 19    Ht 1.549 m (5\' 1" )    Wt 59 kg    SpO2 98%    BMI 24.56 kg/m   Physical Exam Vitals signs and nursing note reviewed.  Constitutional:      Appearance: Normal appearance. She is well-developed.  HENT:     Head: Atraumatic.     Nose: Nose normal.     Mouth/Throat:     Mouth: Mucous membranes are moist.  Eyes:     General: No scleral icterus.     Conjunctiva/sclera: Conjunctivae normal.     Pupils: Pupils are equal, round, and reactive to light.  Neck:     Musculoskeletal: Normal range of motion and neck supple. No neck rigidity or muscular tenderness.     Trachea: No tracheal deviation.  Cardiovascular:     Rate and Rhythm: Normal rate and regular rhythm.  Pulses: Normal pulses.     Heart sounds: Normal heart sounds. No murmur. No friction rub. No gallop.   Pulmonary:     Effort: Pulmonary effort is normal. No respiratory distress.     Breath sounds: Normal breath sounds.  Abdominal:     General: Bowel sounds are normal. There is no distension.     Palpations: Abdomen is soft. There is no mass.     Tenderness: There is no abdominal tenderness. There is no guarding or rebound.     Hernia: No hernia is present.  Genitourinary:    Comments: No cva tenderness.  Musculoskeletal:        General: No swelling.     Right lower leg: No edema.     Left lower leg: No edema.     Comments: LUE av fistula w palp thrill  Skin:    General: Skin is warm and dry.     Findings: No rash.  Neurological:     Mental Status: She is alert.     Comments: Alert, speech normal. Motor intact bil, stre 5/5. sens grossly intact bil.   Psychiatric:        Mood and Affect: Mood normal.      ED Treatments / Results  Labs (all labs ordered are listed, but only abnormal results are displayed) Results for orders placed or performed during the hospital encounter of 12/28/18  CBC  Result Value Ref Range   WBC 8.7 4.0 - 10.5 K/uL   RBC 3.71 (L) 3.87 - 5.11 MIL/uL   Hemoglobin 8.8 (L) 12.0 - 15.0 g/dL   HCT 29.1 (L) 36.0 - 46.0 %   MCV 78.4 (L) 80.0 - 100.0 fL   MCH 23.7 (L) 26.0 - 34.0 pg   MCHC 30.2 30.0 - 36.0 g/dL   RDW 21.0 (H) 11.5 - 15.5 %   Platelets 254 150 - 400 K/uL   nRBC 0.0 0.0 - 0.2 %  Comprehensive metabolic panel  Result Value Ref Range   Sodium 130 (L) 135 - 145 mmol/L   Potassium 5.9 (H) 3.5 - 5.1 mmol/L   Chloride 95 (L)  98 - 111 mmol/L   CO2 25 22 - 32 mmol/L   Glucose, Bld 96 70 - 99 mg/dL   BUN 29 (H) 8 - 23 mg/dL   Creatinine, Ser 7.86 (H) 0.44 - 1.00 mg/dL   Calcium 8.8 (L) 8.9 - 10.3 mg/dL   Total Protein 6.8 6.5 - 8.1 g/dL   Albumin 2.5 (L) 3.5 - 5.0 g/dL   AST 36 15 - 41 U/L   ALT 24 0 - 44 U/L   Alkaline Phosphatase 112 38 - 126 U/L   Total Bilirubin 0.6 0.3 - 1.2 mg/dL   GFR calc non Af Amer 5 (L) >60 mL/min   GFR calc Af Amer 6 (L) >60 mL/min   Anion gap 10 5 - 15   Dg Chest 2 View  Result Date: 12/20/2018 CLINICAL DATA:  Shortness of breath. Acute hypoxic respiratory failure. EXAM: CHEST - 2 VIEW COMPARISON:  Radiograph 12/18/2018 FINDINGS: Right dialysis catheter remains in place. Unchanged cardiomegaly and mediastinal contours. Fine interstitial opacities throughout both lungs, slightly worsened on the left since prior exam. Peripheral opacity at the right lung base is unchanged. No large pleural effusion or pneumothorax. IMPRESSION: Fine interstitial opacities throughout both lungs, slightly worsened on the left since prior exam. This may reflect pulmonary edema or atypical infection. Cardiomegaly is stable. Electronically Signed   By: Keith Rake  M.D.   On: 12/20/2018 20:19   Ct Chest W Contrast  Result Date: 12/03/2018 CLINICAL DATA:  Acute generalized abdominal pain. Shortness of breath. EXAM: CT CHEST, ABDOMEN, AND PELVIS WITH CONTRAST TECHNIQUE: Multidetector CT imaging of the chest, abdomen and pelvis was performed following the standard protocol during bolus administration of intravenous contrast. CONTRAST:  157mL OMNIPAQUE IOHEXOL 300 MG/ML  SOLN COMPARISON:  CT scan of October 23, 2018. FINDINGS: CT CHEST FINDINGS Cardiovascular: There is no evidence of thoracic aortic dissection or aneurysm. Mild cardiomegaly is noted. No pericardial effusion is noted. Right internal jugular dialysis catheter is noted. Mediastinum/Nodes: No enlarged mediastinal, hilar, or axillary lymph nodes.  Thyroid gland, trachea, and esophagus demonstrate no significant findings. Lungs/Pleura: Bilateral diffuse reticular and possibly airspace opacities are noted throughout both lungs. Also noted is fluid in both major fissures, right greater than left. Minimal left basilar effusion is noted as well. Given the history of end-stage renal disease and cardiomegaly, these findings are most consistent with pulmonary edema, although superimposed inflammation or infection may be present. Musculoskeletal: No chest wall mass or suspicious bone lesions identified. CT ABDOMEN PELVIS FINDINGS Hepatobiliary: No focal liver abnormality is seen. Status post cholecystectomy. No biliary dilatation. Pancreas: Unremarkable. No pancreatic ductal dilatation or surrounding inflammatory changes. Spleen: Stable focal low density is noted within the spleen consistent with cyst or hemangioma. Adrenals/Urinary Tract: Adrenal glands are unremarkable. Kidneys are normal, without renal calculi, focal lesion, or hydronephrosis. Bladder is unremarkable. Stomach/Bowel: Stomach is within normal limits. Appendix appears normal. No evidence of bowel wall thickening, distention, or inflammatory changes. Vascular/Lymphatic: Aortic atherosclerosis. No enlarged abdominal or pelvic lymph nodes. Reproductive: Status post hysterectomy. Stable appearance of fatty lesion in left ovary concerning for dermoid. Right adnexal region is unremarkable. Other: No abdominal wall hernia or abnormality. No abdominopelvic ascites. Musculoskeletal: No acute or significant osseous findings. IMPRESSION: Bilateral diffuse interstitial and possibly airspace opacities are noted throughout both lungs, with mild amount of fluid in both major fissures and mild left basilar pleural effusion. These findings are most consistent with pulmonary edema given the history of end-stage renal disease and dialysis, although pneumonia or combination thereof can not be excluded. Stable chronic  findings are noted in the abdomen and pelvis as described above. Aortic Atherosclerosis (ICD10-I70.0). Electronically Signed   By: Marijo Conception, M.D.   On: 12/03/2018 16:28   Ct Abdomen Pelvis W Contrast  Result Date: 12/03/2018 CLINICAL DATA:  Acute generalized abdominal pain. Shortness of breath. EXAM: CT CHEST, ABDOMEN, AND PELVIS WITH CONTRAST TECHNIQUE: Multidetector CT imaging of the chest, abdomen and pelvis was performed following the standard protocol during bolus administration of intravenous contrast. CONTRAST:  187mL OMNIPAQUE IOHEXOL 300 MG/ML  SOLN COMPARISON:  CT scan of October 23, 2018. FINDINGS: CT CHEST FINDINGS Cardiovascular: There is no evidence of thoracic aortic dissection or aneurysm. Mild cardiomegaly is noted. No pericardial effusion is noted. Right internal jugular dialysis catheter is noted. Mediastinum/Nodes: No enlarged mediastinal, hilar, or axillary lymph nodes. Thyroid gland, trachea, and esophagus demonstrate no significant findings. Lungs/Pleura: Bilateral diffuse reticular and possibly airspace opacities are noted throughout both lungs. Also noted is fluid in both major fissures, right greater than left. Minimal left basilar effusion is noted as well. Given the history of end-stage renal disease and cardiomegaly, these findings are most consistent with pulmonary edema, although superimposed inflammation or infection may be present. Musculoskeletal: No chest wall mass or suspicious bone lesions identified. CT ABDOMEN PELVIS FINDINGS Hepatobiliary: No focal liver abnormality is seen.  Status post cholecystectomy. No biliary dilatation. Pancreas: Unremarkable. No pancreatic ductal dilatation or surrounding inflammatory changes. Spleen: Stable focal low density is noted within the spleen consistent with cyst or hemangioma. Adrenals/Urinary Tract: Adrenal glands are unremarkable. Kidneys are normal, without renal calculi, focal lesion, or hydronephrosis. Bladder is  unremarkable. Stomach/Bowel: Stomach is within normal limits. Appendix appears normal. No evidence of bowel wall thickening, distention, or inflammatory changes. Vascular/Lymphatic: Aortic atherosclerosis. No enlarged abdominal or pelvic lymph nodes. Reproductive: Status post hysterectomy. Stable appearance of fatty lesion in left ovary concerning for dermoid. Right adnexal region is unremarkable. Other: No abdominal wall hernia or abnormality. No abdominopelvic ascites. Musculoskeletal: No acute or significant osseous findings. IMPRESSION: Bilateral diffuse interstitial and possibly airspace opacities are noted throughout both lungs, with mild amount of fluid in both major fissures and mild left basilar pleural effusion. These findings are most consistent with pulmonary edema given the history of end-stage renal disease and dialysis, although pneumonia or combination thereof can not be excluded. Stable chronic findings are noted in the abdomen and pelvis as described above. Aortic Atherosclerosis (ICD10-I70.0). Electronically Signed   By: Marijo Conception, M.D.   On: 12/03/2018 16:28   Dg Chest Port 1 View  Result Date: 12/28/2018 CLINICAL DATA:  Dialysis, cough, shortness of breath EXAM: PORTABLE CHEST 1 VIEW COMPARISON:  12/20/2018 FINDINGS: Bilateral mild interstitial thickening. Trace right pleural effusion. No pneumothorax. Stable cardiomegaly. No acute osseous abnormality. IMPRESSION: 1. Mild pulmonary edema. Electronically Signed   By: Kathreen Devoid   On: 12/28/2018 12:15   Dg Chest Port 1 View  Result Date: 12/09/2018 CLINICAL DATA:  Pulmonary edema EXAM: PORTABLE CHEST 1 VIEW COMPARISON:  12/08/2010 FINDINGS: Cardiac shadow remains enlarged. Aortic calcifications are again seen. Dialysis catheter is again noted. Lungs are well aerated bilaterally with diffuse interstitial changes stable from the prior exam. Some slight improved aeration in the right base is noted. No new focal abnormality is noted.  IMPRESSION: Minimal improvement in the right base when compared with the prior exam. Electronically Signed   By: Inez Catalina M.D.   On: 12/09/2018 07:55   Dg Chest Port 1 View  Result Date: 12/08/2018 CLINICAL DATA:  Respiratory failure EXAM: PORTABLE CHEST 1 VIEW COMPARISON:  12/07/2018 FINDINGS: Cardiac shadow remains enlarged. Dialysis catheter remains in place. Aortic calcifications are noted. Lungs are well aerated bilaterally with diffuse interstitial changes similar to that seen on the prior exam. Stable right basilar opacity is noted. No sizable effusion is seen. No bony abnormality is noted. IMPRESSION: Stable interstitial changes and right basilar opacity when compared with the prior exam. No new focal abnormality is noted. Electronically Signed   By: Inez Catalina M.D.   On: 12/08/2018 07:45   Dg Chest Port 1 View  Result Date: 12/07/2018 CLINICAL DATA:  Acute respiratory failure EXAM: PORTABLE CHEST 1 VIEW COMPARISON:  12/06/2018 FINDINGS: Cardiomegaly with mild interstitial edema. Mild patchy right basilar opacity, atelectasis versus pneumonia, grossly unchanged. No definite pleural effusions. No pneumothorax. Right IJ dual lumen dialysis catheter terminates at the cavoatrial junction. Interval extubation and removal of enteric tube. IMPRESSION: Interval extubation and removal of enteric tube. Cardiomegaly with mild interstitial edema. No definite pleural effusions. Mild patchy right basilar opacity, atelectasis versus pneumonia, unchanged. Electronically Signed   By: Julian Hy M.D.   On: 12/07/2018 05:36   Dg Chest Port 1 View  Result Date: 12/06/2018 CLINICAL DATA:  Acute respiratory failure with hypoxia EXAM: PORTABLE CHEST 1 VIEW COMPARISON:  12/05/2018 FINDINGS: Endotracheal  tube terminates 3 cm above the carina. Multifocal patchy right lung opacities, suspicious for pneumonia. Right basilar opacity is mildly improved. Faint interstitial/airspace opacities in the left upper  lobe, unchanged. No definite pleural effusions. No pneumothorax. Cardiomegaly. Enteric tube courses into the mid gastric body. Right IJ dual lumen dialysis catheter terminates at the cavoatrial junction. IMPRESSION: Endotracheal tube terminates 3 cm above the carina. Additional support apparatus as above. Multifocal right lung opacities, lower lobe predominant, suspicious for pneumonia. Superimposed interstitial/airspace opacities may reflect mild interstitial edema. No definite pleural effusions. Electronically Signed   By: Julian Hy M.D.   On: 12/06/2018 07:35   Dg Chest Port 1 View  Result Date: 12/05/2018 CLINICAL DATA:  Hypoxia EXAM: PORTABLE CHEST 1 VIEW COMPARISON:  December 04, 2018 FINDINGS: Endotracheal tube tip is 2.1 cm above the carina. Nasogastric tube tip and side port are below the diaphragm. Side port is seen in the stomach. Central catheter tip is in the superior vena cava. No pneumothorax. There is interstitial and patchy airspace opacity throughout the lungs, more severe on the right than on the left. There has been partial clearing of airspace opacity on the right and left sides compared to 1 day prior. No new opacity evident. Heart remains enlarged with mild pulmonary venous hypertension. No adenopathy. There is aortic atherosclerosis. No bone lesions. IMPRESSION: Tube and catheter positions as described without pneumothorax. Areas of interstitial and airspace opacity bilaterally, more severe on the right than on the left, with mild partial clearing bilaterally. The appearance raises question of a degree of ARDS. No new opacity evident. Underlying pulmonary vascular congestion noted. Aortic Atherosclerosis (ICD10-I70.0). Electronically Signed   By: Lowella Grip III M.D.   On: 12/05/2018 07:20   Dg Chest Port 1 View  Result Date: 12/04/2018 CLINICAL DATA:  Respiratory distress EXAM: PORTABLE CHEST 1 VIEW COMPARISON:  12/03/2018 FINDINGS: Endotracheal tube in good position  unchanged. Dual lumen central venous catheter tip at the cavoatrial junction unchanged. NG tube in the stomach. Severe diffuse bilateral airspace disease right greater than left is unchanged. Small bilateral effusions. IMPRESSION: Diffuse bilateral airspace disease right greater than left unchanged. Electronically Signed   By: Franchot Gallo M.D.   On: 12/04/2018 08:17   Dg Chest Port 1 View  Result Date: 12/03/2018 CLINICAL DATA:  63 year old female with possible COVID 19, intubated. EXAM: PORTABLE CHEST 1 VIEW COMPARISON:  Chest CT earlier today. FINDINGS: Portable AP semi upright view at 2211 hours. Endotracheal tube tip in good position between the level the clavicles and carina. Stable right IJ dual lumen catheter. Diffuse bilateral granular pulmonary opacity persists, greater on the right, and not significantly changed since 12/20 5 hours today. Stable cardiomegaly and mediastinal contours. Paucity of bowel gas in the upper abdomen IMPRESSION: 1. Endotracheal tube tip in good position. 2. No significant change in diffuse bilateral granular pulmonary opacity. Electronically Signed   By: Genevie Ann M.D.   On: 12/03/2018 23:08   Dg Chest Port 1 View  Result Date: 12/03/2018 CLINICAL DATA:  Shortness of breath. EXAM: PORTABLE CHEST 1 VIEW COMPARISON:  Radiograph of November 25, 2018. FINDINGS: Stable cardiomegaly. Right internal jugular dialysis catheter is noted. No pneumothorax is noted. Increased bilateral diffuse lung opacities are noted concerning for pneumonia or possibly edema, right greater than left. Small right pleural effusion may be present. Bony thorax is unremarkable. IMPRESSION: Significantly increased bilateral lung opacities are noted, right greater than left, concerning for pneumonia or possibly edema. Small right pleural effusion is noted.  Electronically Signed   By: Marijo Conception, M.D.   On: 12/03/2018 12:58   Dg Abdomen Acute W/chest  Result Date: 12/18/2018 CLINICAL DATA:  Abdominal  pain and vomiting EXAM: DG ABDOMEN ACUTE W/ 1V CHEST COMPARISON:  Chest radiograph December 09, 2018. FINDINGS: PA chest: There is patchy airspace opacity throughout both lungs, slightly more on the left than on the right. There is cardiomegaly with pulmonary vascularity within normal limits. No adenopathy. There is aortic atherosclerosis. Central catheter tip is in the superior vena cava. Supine and upright abdomen: There is no bowel dilatation or air-fluid level to suggest bowel obstruction. No free air. There are surgical clips in the gallbladder fossa region. There are foci of vascular calcification in the pelvis. IMPRESSION: Airspace opacity throughout both lungs, slightly more on the left than on the right. Suspect multifocal pneumonia. Atypical organism pneumonia may present in this manner. There is cardiomegaly. Aortic Atherosclerosis (ICD10-I70.0). No bowel obstruction or free air evident. Surgical clips in right upper quadrant. Electronically Signed   By: Lowella Grip III M.D.   On: 12/18/2018 19:44    EKG EKG Interpretation  Date/Time:  Sunday December 28 2018 09:53:02 EDT Ventricular Rate:  94 PR Interval:    QRS Duration: 81 QT Interval:  387 QTC Calculation: 484 R Axis:   -43 Text Interpretation:  Sinus rhythm Left anterior fascicular block Left ventricular hypertrophy No significant change since last tracing Confirmed by Lajean Saver [phone]) on 12/28/2018 11:33:38 AM   Radiology Dg Chest Port 1 View  Result Date: 12/28/2018 CLINICAL DATA:  Dialysis, cough, shortness of breath EXAM: PORTABLE CHEST 1 VIEW COMPARISON:  12/20/2018 FINDINGS: Bilateral mild interstitial thickening. Trace right pleural effusion. No pneumothorax. Stable cardiomegaly. No acute osseous abnormality. IMPRESSION: 1. Mild pulmonary edema. Electronically Signed   By: Kathreen Devoid   On: 12/28/2018 12:15    Procedures Procedures (including critical care time)  Medications Ordered in ED Medications  sodium  chloride 0.9 % bolus 250 mL (250 mLs Intravenous New Bag/Given 12/28/18 1020)  metoCLOPramide (REGLAN) injection 10 mg (10 mg Intravenous Given 12/28/18 1019)  morphine 4 MG/ML injection 4 mg (4 mg Intravenous Given 12/28/18 1019)     Initial Impression / Assessment and Plan / ED Course  I have reviewed the triage vital signs and the nursing notes.  Pertinent labs & imaging results that were available during my care of the patient were reviewed by me and considered in my medical decision making (see chart for details).  Iv ns. 250 cc bolus. reglan iv. Labs sent. xr ordered.   Reviewed nursing notes and prior charts for additional history. Pt with recent AAs, and prior ct abd - neg for acute process then. Recent dx c diff, ?non compliant w rx.   Labs reviewed by me - k is high. Nephrology consulted given high k and inadequate hd yesterday.   CXR reviewed by me -  pulm edema c/w missed/limited dialysis, volume overload.   Discussed pt with Dr Schertz/renal - he indicates for k 5.9, missed hd, no emergent tx for k in ED, he will arrange HD - he indicates to admit patient to medical service.  Hospitalists consulted for admission re nvd, not tolerating po, esrd/hd w high K.  CRITICAL CARE RE: endstage renal disease/dialysis, missed dialysis with hyperkalemia, nephrology consult, dialysis.  Performed by: Mirna Mires Total critical care time: 35 minutes Critical care time was exclusive of separately billable procedures and treating other patients. Critical care was necessary to  treat or prevent imminent or life-threatening deterioration. Critical care was time spent personally by me on the following activities: development of treatment plan with patient and/or surrogate as well as nursing, discussions with consultants, evaluation of patient's response to treatment, examination of patient, obtaining history from patient or surrogate, ordering and performing treatments and interventions, ordering  and review of laboratory studies, ordering and review of radiographic studies, pulse oximetry and re-evaluation of patient's condition.   Final Clinical Impressions(s) / ED Diagnoses   Final diagnoses:  None    ED Discharge Orders    None           Lajean Saver, MD 12/28/18 1337

## 2018-12-29 DIAGNOSIS — R1010 Upper abdominal pain, unspecified: Secondary | ICD-10-CM | POA: Diagnosis not present

## 2018-12-29 DIAGNOSIS — N186 End stage renal disease: Secondary | ICD-10-CM | POA: Diagnosis not present

## 2018-12-29 DIAGNOSIS — R197 Diarrhea, unspecified: Secondary | ICD-10-CM | POA: Diagnosis not present

## 2018-12-29 DIAGNOSIS — Z992 Dependence on renal dialysis: Secondary | ICD-10-CM | POA: Diagnosis not present

## 2018-12-29 LAB — CBC WITH DIFFERENTIAL/PLATELET
Abs Immature Granulocytes: 0 10*3/uL (ref 0.00–0.07)
Basophils Absolute: 0.1 10*3/uL (ref 0.0–0.1)
Basophils Relative: 1 %
Eosinophils Absolute: 0.4 10*3/uL (ref 0.0–0.5)
Eosinophils Relative: 4 %
HCT: 27.5 % — ABNORMAL LOW (ref 36.0–46.0)
Hemoglobin: 8.5 g/dL — ABNORMAL LOW (ref 12.0–15.0)
Lymphocytes Relative: 18 %
Lymphs Abs: 1.8 10*3/uL (ref 0.7–4.0)
MCH: 23.6 pg — ABNORMAL LOW (ref 26.0–34.0)
MCHC: 30.9 g/dL (ref 30.0–36.0)
MCV: 76.4 fL — ABNORMAL LOW (ref 80.0–100.0)
Monocytes Absolute: 1.1 10*3/uL — ABNORMAL HIGH (ref 0.1–1.0)
Monocytes Relative: 11 %
Neutro Abs: 6.7 10*3/uL (ref 1.7–7.7)
Neutrophils Relative %: 66 %
Platelets: 260 10*3/uL (ref 150–400)
RBC: 3.6 MIL/uL — ABNORMAL LOW (ref 3.87–5.11)
RDW: 21.4 % — ABNORMAL HIGH (ref 11.5–15.5)
WBC: 10.1 10*3/uL (ref 4.0–10.5)
nRBC: 0 % (ref 0.0–0.2)
nRBC: 2 /100 WBC — ABNORMAL HIGH

## 2018-12-29 LAB — BASIC METABOLIC PANEL
Anion gap: 10 (ref 5–15)
BUN: 35 mg/dL — ABNORMAL HIGH (ref 8–23)
CO2: 25 mmol/L (ref 22–32)
Calcium: 8.7 mg/dL — ABNORMAL LOW (ref 8.9–10.3)
Chloride: 96 mmol/L — ABNORMAL LOW (ref 98–111)
Creatinine, Ser: 9.01 mg/dL — ABNORMAL HIGH (ref 0.44–1.00)
GFR calc Af Amer: 5 mL/min — ABNORMAL LOW (ref >60)
GFR calc non Af Amer: 4 mL/min — ABNORMAL LOW (ref >60)
Glucose, Bld: 71 mg/dL (ref 70–99)
Potassium: 6.1 mmol/L — ABNORMAL HIGH (ref 3.5–5.1)
Sodium: 131 mmol/L — ABNORMAL LOW (ref 135–145)

## 2018-12-29 LAB — MAGNESIUM: Magnesium: 1.7 mg/dL (ref 1.7–2.4)

## 2018-12-29 LAB — GLUCOSE, CAPILLARY
Glucose-Capillary: 110 mg/dL — ABNORMAL HIGH (ref 70–99)
Glucose-Capillary: 78 mg/dL (ref 70–99)
Glucose-Capillary: 110 mg/dL — ABNORMAL HIGH (ref 70–99)

## 2018-12-29 MED ORDER — ALTEPLASE 2 MG IJ SOLR: 2.0000 mg | Freq: Once | INTRAMUSCULAR | Status: DC | PRN

## 2018-12-29 MED ORDER — LIDOCAINE-PRILOCAINE 2.5-2.5 % EX CREA: 1.0000 "application " | TOPICAL_CREAM | CUTANEOUS | Status: DC | PRN

## 2018-12-29 MED ORDER — PENTAFLUOROPROP-TETRAFLUOROETH EX AERO: 1.0000 "application " | INHALATION_SPRAY | CUTANEOUS | Status: DC | PRN

## 2018-12-29 MED ORDER — SODIUM CHLORIDE 0.9 % IV SOLN: 100.0000 mL | INTRAVENOUS | Status: DC | PRN

## 2018-12-29 MED ORDER — POLYETHYLENE GLYCOL 3350 17 G PO PACK
17.0000 g | PACK | Freq: Two times a day (BID) | ORAL | Status: DC
  Administered 2018-12-29: 17 g via ORAL
  Filled 2018-12-29: qty 1

## 2018-12-29 MED ORDER — LIDOCAINE HCL (PF) 1 % IJ SOLN: 5.0000 mL | INTRAMUSCULAR | Status: DC | PRN

## 2018-12-29 MED ORDER — POLYETHYLENE GLYCOL 3350 17 G PO PACK: 17.0000 g | PACK | Freq: Every day | ORAL | 0 refills | Status: DC

## 2018-12-29 MED ORDER — BISACODYL 5 MG PO TBEC
10.0000 mg | DELAYED_RELEASE_TABLET | Freq: Once | ORAL | Status: AC
  Administered 2018-12-29: 10 mg via ORAL
  Filled 2018-12-29: qty 2

## 2018-12-29 MED ORDER — CALCITRIOL 0.25 MCG PO CAPS
ORAL_CAPSULE | ORAL | Status: AC
  Filled 2018-12-29: qty 1

## 2018-12-29 MED ORDER — DOCUSATE SODIUM 100 MG PO CAPS: 100.0000 mg | ORAL_CAPSULE | Freq: Every day | ORAL | 0 refills | Status: AC

## 2018-12-29 MED ORDER — HEPARIN SODIUM (PORCINE) 1000 UNIT/ML DIALYSIS: 1000.0000 [IU] | INTRAMUSCULAR | Status: DC | PRN

## 2018-12-29 MED ORDER — DIPHENHYDRAMINE HCL 25 MG PO CAPS
25.0000 mg | ORAL_CAPSULE | Freq: Three times a day (TID) | ORAL | Status: DC | PRN
  Administered 2018-12-29 (×2): 25 mg via ORAL
  Filled 2018-12-29 (×2): qty 1

## 2018-12-29 MED ORDER — HEPARIN SODIUM (PORCINE) 1000 UNIT/ML IJ SOLN
INTRAMUSCULAR | Status: AC
  Filled 2018-12-29: qty 2

## 2018-12-29 MED ORDER — HEPARIN SODIUM (PORCINE) 1000 UNIT/ML DIALYSIS: 3000.0000 [IU] | Freq: Once | INTRAMUSCULAR | Status: DC

## 2018-12-29 NOTE — Progress Notes (Signed)
Pt discharged. Education completed by day shift Therapist, sports. Pt in no apparent distress. Pt escorted to main entrance in wheelchair by NT.

## 2018-12-29 NOTE — Discharge Summary (Signed)
NEOMIA HERBEL NFA:[id number] DOB: [birth year] DOA: 12/28/2018  PCP: Clent Demark, PA-C  Admit date: 12/28/2018  Discharge date: 12/29/2018  Admitted From: Home   Disposition:  Home   Recommendations for Outpatient Follow-up:   Follow up with PCP in 1-2 weeks  PCP Please obtain BMP/CBC, 2 view CXR in 1week,  (see Discharge instructions)   PCP Please follow up on the following pending results:    Home Health: None   Equipment/Devices: None  Consultations: Renal Discharge Condition: Stable   CODE STATUS: Full   Diet Recommendation: Renal-low carbohydrate, 1.2 L/day total fluid restriction    Chief Complaint  Patient presents with   Emesis   Abdominal Pain     Brief history of present illness from the day of admission and additional interim summary    KAJOL CRISPEN is a 63 y.o. female with medical history significant of HTN, DM, ESRD on HD TTS, Anemia, medication/HD noncompliance, h/o d-diff, presented to the ED with abdominal pain, further history and work-up suggested that she had massive stool burden and constipation.  She tells me today that she usually goes once a day, however for the last 2 weeks she has been not going regularly and feels constipated.  Her stools have been very hard and no diarrhea.                                                                 Hospital Course   1.  Pain and discomfort due to obstipation constipation and stool burden.  Revealed on x-ray, no diarrhea, no nausea vomiting, ambulating in the hallway having coffee and hand, finished her breakfast, she says that she has been constipated for the last 7 to 10 days with hard and formed stools.  Bowel regimen initiated, otherwise resume home medications.  Also discussed the case with nephrologist Dr. Marval Regal, she is due  for her dialysis for ESRD later today after that she will be discharged home.  Other problems of ESRD, hypertension, DM type II are all stable continue home regimen unchanged.   Discharge diagnosis     Principal Problem:   Abdominal pain Active Problems:   ESRD needing dialysis ([place])   Microcytic anemia   Hypertension   Type 2 diabetes mellitus ([place])   Hyperkalemia   Diarrhea    Discharge instructions    Discharge Instructions    Diet - low sodium heart healthy   Complete by:  As directed    Discharge instructions   Complete by:  As directed    Follow with Primary MD Clent Demark, PA-C in 7 days   Get CBC, BMP, ABD Chest X ray -  checked  by Primary MD in 5-7 days    Activity: As tolerated with Full fall precautions use walker/cane & assistance as  needed  Disposition Home    Diet: Low Carb Renal, 1.2 lit/day fluid restriction.  Accuchecks 4 times/day, Once in AM empty stomach and then before each meal. Log in all results and show them to your Prim.MD in 3 days. If any glucose reading is under 80 or above 300 call your Prim MD immidiately. Follow Low glucose instructions for glucose under 80 as instructed.   Special Instructions: If you have smoked or chewed Tobacco  in the last 2 yrs please stop smoking, stop any regular Alcohol  and or any Recreational drug use.  On your next visit with your primary care physician please Get Medicines reviewed and adjusted.  Please request your Prim.MD to go over all Hospital Tests and Procedure/Radiological results at the follow up, please get all Hospital records sent to your Prim MD by signing hospital release before you go home.  If you experience worsening of your admission symptoms, develop shortness of breath, life threatening emergency, suicidal or homicidal thoughts you must seek medical attention immediately by calling 911 or calling your MD immediately  if symptoms less severe.  You Must read complete  instructions/literature along with all the possible adverse reactions/side effects for all the Medicines you take and that have been prescribed to you. Take any new Medicines after you have completely understood and accpet all the possible adverse reactions/side effects.      Discharge Medications   Allergies as of 12/29/2018      Reactions   Acetaminophen Nausea And Vomiting   Patient states Acetaminophen and Acetaminophen containing products make her nauseated. She demonstrated this 06/21/18 with nausea followed by emesis.   Hydroxyzine Itching      Medication List    TAKE these medications   Accu-Chek Aviva device Use as instructed   accu-chek soft touch lancets Use twice per day.   amLODipine 5 MG tablet Commonly known as:  NORVASC Take 1 tablet (5 mg total) by mouth at bedtime.   calcitRIOL 0.25 MCG capsule Commonly known as:  ROCALTROL Take 1 capsule (0.25 mcg total) by mouth every Monday, Wednesday, and Friday with hemodialysis. What changed:  when to take this   Dialyvite 800-Zinc 15 0.8 MG Tabs Take 1 tablet by mouth daily.   diphenhydrAMINE 2 % cream Commonly known as:  BENADRYL Apply 1 application topically 3 (three) times daily as needed for itching.   docusate sodium 100 MG capsule Commonly known as:  Colace Take 1 capsule (100 mg total) by mouth daily for 30 days.   fluticasone 50 MCG/ACT nasal spray Commonly known as:  FLONASE Place 2 sprays into both nostrils daily.   glucose blood test strip Commonly known as:  Accu-Chek Aviva Use twice per day.   insulin aspart 100 UNIT/ML injection Commonly known as:  novoLOG Inject 5 Units into the skin 3 (three) times daily with meals. What changed:  how much to take   insulin glargine 100 UNIT/ML injection Commonly known as:  LANTUS Inject 0.03 mLs (3 Units total) into the skin at bedtime. What changed:  how much to take   INSULIN SYRINGE .3CC/31GX5/16" 31G X 5/16" 0.3 ML Misc Inject 1 each into the  skin 4 (four) times daily.   pantoprazole 40 MG tablet Commonly known as:  Protonix Take 1 tablet (40 mg total) by mouth daily.   polyethylene glycol 17 g packet Commonly known as:  MIRALAX / GLYCOLAX Take 17 g by mouth daily.   sevelamer carbonate 800 MG tablet Commonly known as:  RENVELA  Take 2 tablets (1,600 mg total) by mouth 3 (three) times daily with meals.   vancomycin 125 MG capsule Commonly known as:  VANCOCIN Take 1 capsule (125 mg total) by mouth 4 (four) times daily.       Follow-up Information    Clent Demark, PA-C. Schedule an appointment as soon as possible for a visit in 1 week(s).   Specialty:  Physician Assistant          Major procedures and Radiology Reports - PLEASE review detailed and final reports thoroughly  -      Dg Chest 2 View  Result Date: 12/20/2018 CLINICAL DATA:  Shortness of breath. Acute hypoxic respiratory failure. EXAM: CHEST - 2 VIEW COMPARISON:  Radiograph 12/18/2018 FINDINGS: Right dialysis catheter remains in place. Unchanged cardiomegaly and mediastinal contours. Fine interstitial opacities throughout both lungs, slightly worsened on the left since prior exam. Peripheral opacity at the right lung base is unchanged. No large pleural effusion or pneumothorax. IMPRESSION: Fine interstitial opacities throughout both lungs, slightly worsened on the left since prior exam. This may reflect pulmonary edema or atypical infection. Cardiomegaly is stable. Electronically Signed   By: Keith Rake M.D.   On: 12/20/2018 20:19   Dg Abd 1 View  Result Date: 12/28/2018 CLINICAL DATA:  Abdominal pain EXAM: ABDOMEN - 1 VIEW COMPARISON:  CT abdomen 12/03/2018 FINDINGS: There is a large amount of stool throughout the colon. There is no bowel dilatation to suggest obstruction. There is no evidence of pneumoperitoneum, portal venous gas or pneumatosis. There are no pathologic calcifications along the expected course of the ureters. The osseous  structures are unremarkable. IMPRESSION: Negative. Electronically Signed   By: Kathreen Devoid   On: 12/28/2018 15:06   Ct Chest W Contrast  Result Date: 12/03/2018 CLINICAL DATA:  Acute generalized abdominal pain. Shortness of breath. EXAM: CT CHEST, ABDOMEN, AND PELVIS WITH CONTRAST TECHNIQUE: Multidetector CT imaging of the chest, abdomen and pelvis was performed following the standard protocol during bolus administration of intravenous contrast. CONTRAST:  137mL OMNIPAQUE IOHEXOL 300 MG/ML  SOLN COMPARISON:  CT scan of October 23, 2018. FINDINGS: CT CHEST FINDINGS Cardiovascular: There is no evidence of thoracic aortic dissection or aneurysm. Mild cardiomegaly is noted. No pericardial effusion is noted. Right internal jugular dialysis catheter is noted. Mediastinum/Nodes: No enlarged mediastinal, hilar, or axillary lymph nodes. Thyroid gland, trachea, and esophagus demonstrate no significant findings. Lungs/Pleura: Bilateral diffuse reticular and possibly airspace opacities are noted throughout both lungs. Also noted is fluid in both major fissures, right greater than left. Minimal left basilar effusion is noted as well. Given the history of end-stage renal disease and cardiomegaly, these findings are most consistent with pulmonary edema, although superimposed inflammation or infection may be present. Musculoskeletal: No chest wall mass or suspicious bone lesions identified. CT ABDOMEN PELVIS FINDINGS Hepatobiliary: No focal liver abnormality is seen. Status post cholecystectomy. No biliary dilatation. Pancreas: Unremarkable. No pancreatic ductal dilatation or surrounding inflammatory changes. Spleen: Stable focal low density is noted within the spleen consistent with cyst or hemangioma. Adrenals/Urinary Tract: Adrenal glands are unremarkable. Kidneys are normal, without renal calculi, focal lesion, or hydronephrosis. Bladder is unremarkable. Stomach/Bowel: Stomach is within normal limits. Appendix appears  normal. No evidence of bowel wall thickening, distention, or inflammatory changes. Vascular/Lymphatic: Aortic atherosclerosis. No enlarged abdominal or pelvic lymph nodes. Reproductive: Status post hysterectomy. Stable appearance of fatty lesion in left ovary concerning for dermoid. Right adnexal region is unremarkable. Other: No abdominal wall hernia or abnormality. No abdominopelvic  ascites. Musculoskeletal: No acute or significant osseous findings. IMPRESSION: Bilateral diffuse interstitial and possibly airspace opacities are noted throughout both lungs, with mild amount of fluid in both major fissures and mild left basilar pleural effusion. These findings are most consistent with pulmonary edema given the history of end-stage renal disease and dialysis, although pneumonia or combination thereof can not be excluded. Stable chronic findings are noted in the abdomen and pelvis as described above. Aortic Atherosclerosis (ICD10-I70.0). Electronically Signed   By: Marijo Conception, M.D.   On: 12/03/2018 16:28   Ct Abdomen Pelvis W Contrast  Result Date: 12/03/2018 CLINICAL DATA:  Acute generalized abdominal pain. Shortness of breath. EXAM: CT CHEST, ABDOMEN, AND PELVIS WITH CONTRAST TECHNIQUE: Multidetector CT imaging of the chest, abdomen and pelvis was performed following the standard protocol during bolus administration of intravenous contrast. CONTRAST:  196mL OMNIPAQUE IOHEXOL 300 MG/ML  SOLN COMPARISON:  CT scan of October 23, 2018. FINDINGS: CT CHEST FINDINGS Cardiovascular: There is no evidence of thoracic aortic dissection or aneurysm. Mild cardiomegaly is noted. No pericardial effusion is noted. Right internal jugular dialysis catheter is noted. Mediastinum/Nodes: No enlarged mediastinal, hilar, or axillary lymph nodes. Thyroid gland, trachea, and esophagus demonstrate no significant findings. Lungs/Pleura: Bilateral diffuse reticular and possibly airspace opacities are noted throughout both lungs. Also  noted is fluid in both major fissures, right greater than left. Minimal left basilar effusion is noted as well. Given the history of end-stage renal disease and cardiomegaly, these findings are most consistent with pulmonary edema, although superimposed inflammation or infection may be present. Musculoskeletal: No chest wall mass or suspicious bone lesions identified. CT ABDOMEN PELVIS FINDINGS Hepatobiliary: No focal liver abnormality is seen. Status post cholecystectomy. No biliary dilatation. Pancreas: Unremarkable. No pancreatic ductal dilatation or surrounding inflammatory changes. Spleen: Stable focal low density is noted within the spleen consistent with cyst or hemangioma. Adrenals/Urinary Tract: Adrenal glands are unremarkable. Kidneys are normal, without renal calculi, focal lesion, or hydronephrosis. Bladder is unremarkable. Stomach/Bowel: Stomach is within normal limits. Appendix appears normal. No evidence of bowel wall thickening, distention, or inflammatory changes. Vascular/Lymphatic: Aortic atherosclerosis. No enlarged abdominal or pelvic lymph nodes. Reproductive: Status post hysterectomy. Stable appearance of fatty lesion in left ovary concerning for dermoid. Right adnexal region is unremarkable. Other: No abdominal wall hernia or abnormality. No abdominopelvic ascites. Musculoskeletal: No acute or significant osseous findings. IMPRESSION: Bilateral diffuse interstitial and possibly airspace opacities are noted throughout both lungs, with mild amount of fluid in both major fissures and mild left basilar pleural effusion. These findings are most consistent with pulmonary edema given the history of end-stage renal disease and dialysis, although pneumonia or combination thereof can not be excluded. Stable chronic findings are noted in the abdomen and pelvis as described above. Aortic Atherosclerosis (ICD10-I70.0). Electronically Signed   By: Marijo Conception, M.D.   On: 12/03/2018 16:28   Dg Chest  Port 1 View  Result Date: 12/28/2018 CLINICAL DATA:  Dialysis, cough, shortness of breath EXAM: PORTABLE CHEST 1 VIEW COMPARISON:  12/20/2018 FINDINGS: Bilateral mild interstitial thickening. Trace right pleural effusion. No pneumothorax. Stable cardiomegaly. No acute osseous abnormality. IMPRESSION: 1. Mild pulmonary edema. Electronically Signed   By: Kathreen Devoid   On: 12/28/2018 12:15   Dg Chest Port 1 View  Result Date: 12/09/2018 CLINICAL DATA:  Pulmonary edema EXAM: PORTABLE CHEST 1 VIEW COMPARISON:  12/08/2010 FINDINGS: Cardiac shadow remains enlarged. Aortic calcifications are again seen. Dialysis catheter is again noted. Lungs are well aerated bilaterally with diffuse  interstitial changes stable from the prior exam. Some slight improved aeration in the right base is noted. No new focal abnormality is noted. IMPRESSION: Minimal improvement in the right base when compared with the prior exam. Electronically Signed   By: Inez Catalina M.D.   On: 12/09/2018 07:55   Dg Chest Port 1 View  Result Date: 12/08/2018 CLINICAL DATA:  Respiratory failure EXAM: PORTABLE CHEST 1 VIEW COMPARISON:  12/07/2018 FINDINGS: Cardiac shadow remains enlarged. Dialysis catheter remains in place. Aortic calcifications are noted. Lungs are well aerated bilaterally with diffuse interstitial changes similar to that seen on the prior exam. Stable right basilar opacity is noted. No sizable effusion is seen. No bony abnormality is noted. IMPRESSION: Stable interstitial changes and right basilar opacity when compared with the prior exam. No new focal abnormality is noted. Electronically Signed   By: Inez Catalina M.D.   On: 12/08/2018 07:45   Dg Chest Port 1 View  Result Date: 12/07/2018 CLINICAL DATA:  Acute respiratory failure EXAM: PORTABLE CHEST 1 VIEW COMPARISON:  12/06/2018 FINDINGS: Cardiomegaly with mild interstitial edema. Mild patchy right basilar opacity, atelectasis versus pneumonia, grossly unchanged. No definite  pleural effusions. No pneumothorax. Right IJ dual lumen dialysis catheter terminates at the cavoatrial junction. Interval extubation and removal of enteric tube. IMPRESSION: Interval extubation and removal of enteric tube. Cardiomegaly with mild interstitial edema. No definite pleural effusions. Mild patchy right basilar opacity, atelectasis versus pneumonia, unchanged. Electronically Signed   By: Julian Hy M.D.   On: 12/07/2018 05:36   Dg Chest Port 1 View  Result Date: 12/06/2018 CLINICAL DATA:  Acute respiratory failure with hypoxia EXAM: PORTABLE CHEST 1 VIEW COMPARISON:  12/05/2018 FINDINGS: Endotracheal tube terminates 3 cm above the carina. Multifocal patchy right lung opacities, suspicious for pneumonia. Right basilar opacity is mildly improved. Faint interstitial/airspace opacities in the left upper lobe, unchanged. No definite pleural effusions. No pneumothorax. Cardiomegaly. Enteric tube courses into the mid gastric body. Right IJ dual lumen dialysis catheter terminates at the cavoatrial junction. IMPRESSION: Endotracheal tube terminates 3 cm above the carina. Additional support apparatus as above. Multifocal right lung opacities, lower lobe predominant, suspicious for pneumonia. Superimposed interstitial/airspace opacities may reflect mild interstitial edema. No definite pleural effusions. Electronically Signed   By: Julian Hy M.D.   On: 12/06/2018 07:35   Dg Chest Port 1 View  Result Date: 12/05/2018 CLINICAL DATA:  Hypoxia EXAM: PORTABLE CHEST 1 VIEW COMPARISON:  December 04, 2018 FINDINGS: Endotracheal tube tip is 2.1 cm above the carina. Nasogastric tube tip and side port are below the diaphragm. Side port is seen in the stomach. Central catheter tip is in the superior vena cava. No pneumothorax. There is interstitial and patchy airspace opacity throughout the lungs, more severe on the right than on the left. There has been partial clearing of airspace opacity on the right and  left sides compared to 1 day prior. No new opacity evident. Heart remains enlarged with mild pulmonary venous hypertension. No adenopathy. There is aortic atherosclerosis. No bone lesions. IMPRESSION: Tube and catheter positions as described without pneumothorax. Areas of interstitial and airspace opacity bilaterally, more severe on the right than on the left, with mild partial clearing bilaterally. The appearance raises question of a degree of ARDS. No new opacity evident. Underlying pulmonary vascular congestion noted. Aortic Atherosclerosis (ICD10-I70.0). Electronically Signed   By: Lowella Grip III M.D.   On: 12/05/2018 07:20   Dg Chest Port 1 View  Result Date: 12/04/2018 CLINICAL DATA:  Respiratory  distress EXAM: PORTABLE CHEST 1 VIEW COMPARISON:  12/03/2018 FINDINGS: Endotracheal tube in good position unchanged. Dual lumen central venous catheter tip at the cavoatrial junction unchanged. NG tube in the stomach. Severe diffuse bilateral airspace disease right greater than left is unchanged. Small bilateral effusions. IMPRESSION: Diffuse bilateral airspace disease right greater than left unchanged. Electronically Signed   By: Franchot Gallo M.D.   On: 12/04/2018 08:17   Dg Chest Port 1 View  Result Date: 12/03/2018 CLINICAL DATA:  63 year old female with possible COVID 19, intubated. EXAM: PORTABLE CHEST 1 VIEW COMPARISON:  Chest CT earlier today. FINDINGS: Portable AP semi upright view at 2211 hours. Endotracheal tube tip in good position between the level the clavicles and carina. Stable right IJ dual lumen catheter. Diffuse bilateral granular pulmonary opacity persists, greater on the right, and not significantly changed since 12/20 5 hours today. Stable cardiomegaly and mediastinal contours. Paucity of bowel gas in the upper abdomen IMPRESSION: 1. Endotracheal tube tip in good position. 2. No significant change in diffuse bilateral granular pulmonary opacity. Electronically Signed   By: Genevie Ann  M.D.   On: 12/03/2018 23:08   Dg Chest Port 1 View  Result Date: 12/03/2018 CLINICAL DATA:  Shortness of breath. EXAM: PORTABLE CHEST 1 VIEW COMPARISON:  Radiograph of November 25, 2018. FINDINGS: Stable cardiomegaly. Right internal jugular dialysis catheter is noted. No pneumothorax is noted. Increased bilateral diffuse lung opacities are noted concerning for pneumonia or possibly edema, right greater than left. Small right pleural effusion may be present. Bony thorax is unremarkable. IMPRESSION: Significantly increased bilateral lung opacities are noted, right greater than left, concerning for pneumonia or possibly edema. Small right pleural effusion is noted. Electronically Signed   By: Marijo Conception, M.D.   On: 12/03/2018 12:58   Dg Abdomen Acute W/chest  Result Date: 12/18/2018 CLINICAL DATA:  Abdominal pain and vomiting EXAM: DG ABDOMEN ACUTE W/ 1V CHEST COMPARISON:  Chest radiograph December 09, 2018. FINDINGS: PA chest: There is patchy airspace opacity throughout both lungs, slightly more on the left than on the right. There is cardiomegaly with pulmonary vascularity within normal limits. No adenopathy. There is aortic atherosclerosis. Central catheter tip is in the superior vena cava. Supine and upright abdomen: There is no bowel dilatation or air-fluid level to suggest bowel obstruction. No free air. There are surgical clips in the gallbladder fossa region. There are foci of vascular calcification in the pelvis. IMPRESSION: Airspace opacity throughout both lungs, slightly more on the left than on the right. Suspect multifocal pneumonia. Atypical organism pneumonia may present in this manner. There is cardiomegaly. Aortic Atherosclerosis (ICD10-I70.0). No bowel obstruction or free air evident. Surgical clips in right upper quadrant. Electronically Signed   By: Lowella Grip III M.D.   On: 12/18/2018 19:44    Micro Results     No results found for this or any previous visit (from the past 240  hour(s)).  Today   Subjective    Allina Riches today has no headache,no chest abdominal pain,no new weakness tingling or numbness, feels much better wants to go home today.     Objective   Blood pressure (!) 158/83, pulse 83, temperature 98.3 F (36.8 C), temperature source Oral, resp. rate 18, height 5\' 1"  (1.549 m), weight 59 kg, SpO2 100 %.   Intake/Output Summary (Last 24 hours) at 12/29/2018 0955 Last data filed at 12/29/2018 0944 Gross per 24 hour  Intake 370 ml  Output --  Net 370 ml  Exam Awake Alert, Oriented x 3, No new F.N deficits, Normal affect [place].AT,PERRAL Supple Neck,No JVD, No cervical lymphadenopathy appriciated.  Symmetrical Chest wall movement, Good air movement bilaterally, CTAB RRR,No Gallops,Rubs or new Murmurs, No Parasternal Heave +ve B.Sounds, Abd Soft, Non tender, No organomegaly appriciated, No rebound -guarding or rigidity. No Cyanosis, Clubbing or edema, No new Rash or bruise   Data Review   CBC w Diff:  Lab Results  Component Value Date   WBC 10.1 12/29/2018   HGB 8.5 (L) 12/29/2018   HGB 8.0 (L) 03/27/2018   HCT 27.5 (L) 12/29/2018   HCT 25.3 (L) 03/27/2018   PLT 260 12/29/2018   PLT 126 (L) 03/27/2018   LYMPHOPCT 18 12/29/2018   MONOPCT 11 12/29/2018   EOSPCT 4 12/29/2018   BASOPCT 1 12/29/2018    CMP:  Lab Results  Component Value Date   NA 131 (L) 12/29/2018   NA 135 03/25/2018   K 6.1 (H) 12/29/2018   CL 96 (L) 12/29/2018   CO2 25 12/29/2018   BUN 35 (H) 12/29/2018   BUN 96 (HH) 03/25/2018   CREATININE 9.01 (H) 12/29/2018   PROT 6.8 12/28/2018   ALBUMIN 2.5 (L) 12/28/2018   BILITOT 0.6 12/28/2018   ALKPHOS 112 12/28/2018   AST 36 12/28/2018   ALT 24 12/28/2018  .   Total Time in preparing paper work, data evaluation and todays exam - 32 minutes  Lala Lund M.D on 12/29/2018 at 9:55 AM  [facility]   Office  [phone]

## 2018-12-29 NOTE — Progress Notes (Signed)
Patient has an order to be D/C home; she will leave after 7:30 PM when her son will be able to come and pick her up.

## 2018-12-29 NOTE — Progress Notes (Signed)
Patient back from HD. Alert and oriented x 4, no complaints of pain or other discomfort. VS stable. Will continue to monitor.

## 2018-12-29 NOTE — Procedures (Signed)
I was present at this dialysis session. I have reviewed the session itself and made appropriate changes.   Vital signs in last 24 hours:  Temp:  [98.3 F (36.8 C)-99.3 F (37.4 C)] 98.9 F (37.2 C) (04/20 1056) Pulse Rate:  [82-100] 85 (04/20 1056) Resp:  [18-20] 20 (04/20 1056) BP: (158-198)/(83-96) 176/89 (04/20 1056) SpO2:  [95 %-100 %] 100 % (04/20 1056) Weight change:  Filed Weights   12/28/18 0958  Weight: 59 kg    Recent Labs  Lab 12/29/18 0420  NA 131*  K 6.1*  CL 96*  CO2 25  GLUCOSE 71  BUN 35*  CREATININE 9.01*  CALCIUM 8.7*    Recent Labs  Lab 12/28/18 1023 12/29/18 0420  WBC 8.7 10.1  NEUTROABS  --  6.7  HGB 8.8* 8.5*  HCT 29.1* 27.5*  MCV 78.4* 76.4*  PLT 254 260    Scheduled Meds: . amLODipine  5 mg Oral QHS  . calcitRIOL  0.25 mcg Oral Q M,W,F-HD  . Chlorhexidine Gluconate Cloth  6 each Topical Q0600  . fluticasone  2 spray Each Nare Daily  . heparin  3,000 Units Dialysis Once in dialysis  . heparin  5,000 Units Subcutaneous Q8H  . insulin aspart  0-15 Units Subcutaneous TID WC  . insulin aspart  0-5 Units Subcutaneous QHS  . insulin aspart  3 Units Subcutaneous TID WC  . insulin glargine  5 Units Subcutaneous QHS  . metoCLOPramide  10 mg Oral TID AC & HS  . ondansetron (ZOFRAN) IV  4 mg Intravenous Once  . pantoprazole  40 mg Oral Daily  . polyethylene glycol  17 g Oral BID  . sevelamer carbonate  1,600 mg Oral TID WC  . sodium chloride flush  3 mL Intravenous Q12H   Continuous Infusions: . sodium chloride    . sodium chloride    . sodium chloride    . sodium chloride    . sodium chloride     PRN Meds:.sodium chloride, sodium chloride, sodium chloride, sodium chloride, sodium chloride, alteplase, diphenhydrAMINE, heparin, lidocaine (PF), lidocaine-prilocaine, morphine injection, ondansetron (ZOFRAN) IV, oxyCODONE, pentafluoroprop-tetrafluoroeth   Rhonda Potts,  MD 12/29/2018, 1:38 PM

## 2018-12-29 NOTE — Progress Notes (Signed)
Patient went to dialysis; alert and oriented x 4; received pain medicine for abdominal pain. Will continue to monitor.

## 2018-12-29 NOTE — Progress Notes (Signed)
Dialysis Coordinator notified OP HD clinic staff of patient's discharge today to ensure continuity of care.   Alphonzo Cruise Dialysis Coordinator [phone]

## 2018-12-29 NOTE — Discharge Instructions (Signed)
Follow with Primary MD Clent Demark, PA-C in 7 days   Get CBC, BMP, ABD Chest X ray -  checked  by Primary MD in 5-7 days    Activity: As tolerated with Full fall precautions use walker/cane & assistance as needed  Disposition Home    Diet: Low Carb Renal, 1.2 lit/day fluid restriction  Accuchecks 4 times/day, Once in AM empty stomach and then before each meal. Log in all results and show them to your Prim.MD in 3 days. If any glucose reading is under 80 or above 300 call your Prim MD immidiately. Follow Low glucose instructions for glucose under 80 as instructed.   Special Instructions: If you have smoked or chewed Tobacco  in the last 2 yrs please stop smoking, stop any regular Alcohol  and or any Recreational drug use.  On your next visit with your primary care physician please Get Medicines reviewed and adjusted.  Please request your Prim.MD to go over all Hospital Tests and Procedure/Radiological results at the follow up, please get all Hospital records sent to your Prim MD by signing hospital release before you go home.  If you experience worsening of your admission symptoms, develop shortness of breath, life threatening emergency, suicidal or homicidal thoughts you must seek medical attention immediately by calling 911 or calling your MD immediately  if symptoms less severe.  You Must read complete instructions/literature along with all the possible adverse reactions/side effects for all the Medicines you take and that have been prescribed to you. Take any new Medicines after you have completely understood and accpet all the possible adverse reactions/side effects.

## 2018-12-30 LAB — HEMOGLOBIN A1C
Hgb A1c MFr Bld: 4.8 % (ref 4.8–5.6)
Mean Plasma Glucose: 91 mg/dL

## 2019-01-11 ENCOUNTER — Other Ambulatory Visit: Payer: Self-pay

## 2019-01-11 ENCOUNTER — Emergency Department (HOSPITAL_COMMUNITY): Payer: Medicare Other

## 2019-01-11 ENCOUNTER — Encounter (HOSPITAL_COMMUNITY): Payer: Self-pay | Admitting: Pharmacy Technician

## 2019-01-11 ENCOUNTER — Inpatient Hospital Stay (HOSPITAL_COMMUNITY)
Admission: EM | Admit: 2019-01-11 | Discharge: 2019-01-20 | DRG: 252 | Disposition: A | Discharge status: Home / Self Care | Payer: Medicare Other | Attending: Internal Medicine | Admitting: Internal Medicine

## 2019-01-11 DIAGNOSIS — K2970 Gastritis, unspecified, without bleeding: Secondary | ICD-10-CM | POA: Diagnosis present

## 2019-01-11 DIAGNOSIS — G9341 Metabolic encephalopathy: Secondary | ICD-10-CM

## 2019-01-11 DIAGNOSIS — F1010 Alcohol abuse, uncomplicated: Secondary | ICD-10-CM

## 2019-01-11 DIAGNOSIS — Z9889 Other specified postprocedural states: Secondary | ICD-10-CM

## 2019-01-11 DIAGNOSIS — E8770 Fluid overload, unspecified: Secondary | ICD-10-CM | POA: Diagnosis present

## 2019-01-11 DIAGNOSIS — F1410 Cocaine abuse, uncomplicated: Secondary | ICD-10-CM

## 2019-01-11 DIAGNOSIS — F17210 Nicotine dependence, cigarettes, uncomplicated: Secondary | ICD-10-CM | POA: Diagnosis present

## 2019-01-11 DIAGNOSIS — T82868A Thrombosis of vascular prosthetic devices, implants and grafts, initial encounter: Secondary | ICD-10-CM | POA: Diagnosis not present

## 2019-01-11 DIAGNOSIS — G92 Toxic encephalopathy: Secondary | ICD-10-CM | POA: Diagnosis present

## 2019-01-11 DIAGNOSIS — J9601 Acute respiratory failure with hypoxia: Secondary | ICD-10-CM

## 2019-01-11 DIAGNOSIS — R079 Chest pain, unspecified: Secondary | ICD-10-CM | POA: Diagnosis not present

## 2019-01-11 DIAGNOSIS — Z419 Encounter for procedure for purposes other than remedying health state, unspecified: Secondary | ICD-10-CM

## 2019-01-11 DIAGNOSIS — Z888 Allergy status to other drugs, medicaments and biological substances status: Secondary | ICD-10-CM

## 2019-01-11 DIAGNOSIS — I5023 Acute on chronic systolic (congestive) heart failure: Secondary | ICD-10-CM | POA: Diagnosis not present

## 2019-01-11 DIAGNOSIS — I5043 Acute on chronic combined systolic (congestive) and diastolic (congestive) heart failure: Secondary | ICD-10-CM | POA: Diagnosis present

## 2019-01-11 DIAGNOSIS — Z86711 Personal history of pulmonary embolism: Secondary | ICD-10-CM

## 2019-01-11 DIAGNOSIS — Z79899 Other long term (current) drug therapy: Secondary | ICD-10-CM

## 2019-01-11 DIAGNOSIS — K210 Gastro-esophageal reflux disease with esophagitis: Secondary | ICD-10-CM | POA: Diagnosis present

## 2019-01-11 DIAGNOSIS — F17200 Nicotine dependence, unspecified, uncomplicated: Secondary | ICD-10-CM

## 2019-01-11 DIAGNOSIS — Z7982 Long term (current) use of aspirin: Secondary | ICD-10-CM

## 2019-01-11 DIAGNOSIS — I10 Essential (primary) hypertension: Secondary | ICD-10-CM | POA: Diagnosis present

## 2019-01-11 DIAGNOSIS — Z91199 Patient's noncompliance with other medical treatment and regimen due to unspecified reason: Secondary | ICD-10-CM

## 2019-01-11 DIAGNOSIS — Y832 Surgical operation with anastomosis, bypass or graft as the cause of abnormal reaction of the patient, or of later complication, without mention of misadventure at the time of the procedure: Secondary | ICD-10-CM | POA: Diagnosis present

## 2019-01-11 DIAGNOSIS — IMO0001 Reserved for inherently not codable concepts without codable children: Secondary | ICD-10-CM

## 2019-01-11 DIAGNOSIS — Z886 Allergy status to analgesic agent status: Secondary | ICD-10-CM

## 2019-01-11 DIAGNOSIS — R778 Other specified abnormalities of plasma proteins: Secondary | ICD-10-CM | POA: Diagnosis present

## 2019-01-11 DIAGNOSIS — F1110 Opioid abuse, uncomplicated: Secondary | ICD-10-CM | POA: Diagnosis present

## 2019-01-11 DIAGNOSIS — I132 Hypertensive heart and chronic kidney disease with heart failure and with stage 5 chronic kidney disease, or end stage renal disease: Principal | ICD-10-CM | POA: Diagnosis present

## 2019-01-11 DIAGNOSIS — Z09 Encounter for follow-up examination after completed treatment for conditions other than malignant neoplasm: Secondary | ICD-10-CM

## 2019-01-11 DIAGNOSIS — Z7951 Long term (current) use of inhaled steroids: Secondary | ICD-10-CM

## 2019-01-11 DIAGNOSIS — B1920 Unspecified viral hepatitis C without hepatic coma: Secondary | ICD-10-CM | POA: Diagnosis present

## 2019-01-11 DIAGNOSIS — N186 End stage renal disease: Secondary | ICD-10-CM

## 2019-01-11 DIAGNOSIS — Z9115 Patient's noncompliance with renal dialysis: Secondary | ICD-10-CM

## 2019-01-11 DIAGNOSIS — J810 Acute pulmonary edema: Secondary | ICD-10-CM | POA: Diagnosis not present

## 2019-01-11 DIAGNOSIS — Z992 Dependence on renal dialysis: Secondary | ICD-10-CM

## 2019-01-11 DIAGNOSIS — Z20828 Contact with and (suspected) exposure to other viral communicable diseases: Secondary | ICD-10-CM | POA: Diagnosis present

## 2019-01-11 DIAGNOSIS — Z794 Long term (current) use of insulin: Secondary | ICD-10-CM

## 2019-01-11 DIAGNOSIS — E875 Hyperkalemia: Secondary | ICD-10-CM

## 2019-01-11 DIAGNOSIS — E1122 Type 2 diabetes mellitus with diabetic chronic kidney disease: Secondary | ICD-10-CM | POA: Diagnosis present

## 2019-01-11 DIAGNOSIS — Z9119 Patient's noncompliance with other medical treatment and regimen: Secondary | ICD-10-CM

## 2019-01-11 DIAGNOSIS — E11649 Type 2 diabetes mellitus with hypoglycemia without coma: Secondary | ICD-10-CM | POA: Diagnosis not present

## 2019-01-11 DIAGNOSIS — Z8673 Personal history of transient ischemic attack (TIA), and cerebral infarction without residual deficits: Secondary | ICD-10-CM

## 2019-01-11 DIAGNOSIS — E119 Type 2 diabetes mellitus without complications: Secondary | ICD-10-CM

## 2019-01-11 DIAGNOSIS — D631 Anemia in chronic kidney disease: Secondary | ICD-10-CM | POA: Diagnosis present

## 2019-01-11 DIAGNOSIS — R7989 Other specified abnormal findings of blood chemistry: Secondary | ICD-10-CM

## 2019-01-11 DIAGNOSIS — Z79891 Long term (current) use of opiate analgesic: Secondary | ICD-10-CM

## 2019-01-11 LAB — BASIC METABOLIC PANEL
Anion gap: 21 — ABNORMAL HIGH (ref 5–15)
BUN: 116 mg/dL — ABNORMAL HIGH (ref 8–23)
CO2: 18 mmol/L — ABNORMAL LOW (ref 22–32)
Calcium: 7.3 mg/dL — ABNORMAL LOW (ref 8.9–10.3)
Chloride: 103 mmol/L (ref 98–111)
Creatinine, Ser: 16.93 mg/dL — ABNORMAL HIGH (ref 0.44–1.00)
GFR calc Af Amer: 2 mL/min — ABNORMAL LOW (ref >60)
GFR calc non Af Amer: 2 mL/min — ABNORMAL LOW (ref >60)
Glucose, Bld: 126 mg/dL — ABNORMAL HIGH (ref 70–99)
Potassium: 5.5 mmol/L — ABNORMAL HIGH (ref 3.5–5.1)
Sodium: 142 mmol/L (ref 135–145)

## 2019-01-11 LAB — POCT I-STAT EG7
Acid-base deficit: 4 mmol/L — ABNORMAL HIGH (ref 0.0–2.0)
Bicarbonate: 20.5 mmol/L (ref 20.0–28.0)
Calcium, Ion: 0.84 mmol/L — CL (ref 1.15–1.40)
HCT: 31 % — ABNORMAL LOW (ref 36.0–46.0)
Hemoglobin: 10.5 g/dL — ABNORMAL LOW (ref 12.0–15.0)
O2 Saturation: 96 %
Potassium: 5.4 mmol/L — ABNORMAL HIGH (ref 3.5–5.1)
Sodium: 141 mmol/L (ref 135–145)
TCO2: 22 mmol/L (ref 22–32)
pCO2, Ven: 33.4 mmHg — ABNORMAL LOW (ref 44.0–60.0)
pH, Ven: 7.397 (ref 7.250–7.430)
pO2, Ven: 80 mmHg — ABNORMAL HIGH (ref 32.0–45.0)

## 2019-01-11 LAB — CBC WITH DIFFERENTIAL/PLATELET
Abs Immature Granulocytes: 0.04 10*3/uL (ref 0.00–0.07)
Basophils Absolute: 0 10*3/uL (ref 0.0–0.1)
Basophils Relative: 0 %
Eosinophils Absolute: 0.1 10*3/uL (ref 0.0–0.5)
Eosinophils Relative: 1 %
HCT: 29.3 % — ABNORMAL LOW (ref 36.0–46.0)
Hemoglobin: 9 g/dL — ABNORMAL LOW (ref 12.0–15.0)
Immature Granulocytes: 0 %
Lymphocytes Relative: 16 %
Lymphs Abs: 1.5 10*3/uL (ref 0.7–4.0)
MCH: 23.7 pg — ABNORMAL LOW (ref 26.0–34.0)
MCHC: 30.7 g/dL (ref 30.0–36.0)
MCV: 77.3 fL — ABNORMAL LOW (ref 80.0–100.0)
Monocytes Absolute: 0.9 10*3/uL (ref 0.1–1.0)
Monocytes Relative: 10 %
Neutro Abs: 6.7 10*3/uL (ref 1.7–7.7)
Neutrophils Relative %: 73 %
Platelets: 310 10*3/uL (ref 150–400)
RBC: 3.79 MIL/uL — ABNORMAL LOW (ref 3.87–5.11)
RDW: 22.5 % — ABNORMAL HIGH (ref 11.5–15.5)
WBC: 9.2 10*3/uL (ref 4.0–10.5)
nRBC: 0.2 % (ref 0.0–0.2)

## 2019-01-11 LAB — LACTATE DEHYDROGENASE: LDH: 428 U/L — ABNORMAL HIGH (ref 98–192)

## 2019-01-11 LAB — TROPONIN I
Troponin I: 0.06 ng/mL (ref <0.03)
Troponin I: 0.07 ng/mL (ref <0.03)

## 2019-01-11 LAB — SARS CORONAVIRUS 2 BY RT PCR (HOSPITAL ORDER, PERFORMED IN ~~LOC~~ HOSPITAL LAB): SARS Coronavirus 2: NEGATIVE

## 2019-01-11 LAB — PROTIME-INR
INR: 1.2 (ref 0.8–1.2)
Prothrombin Time: 15.1 seconds (ref 11.4–15.2)

## 2019-01-11 LAB — D-DIMER, QUANTITATIVE: D-Dimer, Quant: 9.63 ug/mL-FEU — ABNORMAL HIGH (ref 0.00–0.50)

## 2019-01-11 MED ORDER — FLUTICASONE PROPIONATE 50 MCG/ACT NA SUSP
2.0000 | Freq: Every day | NASAL | Status: DC
  Administered 2019-01-14 – 2019-01-18 (×5): 2 via NASAL
  Filled 2019-01-11: qty 16

## 2019-01-11 MED ORDER — CALCITRIOL 0.25 MCG PO CAPS: 0.2500 ug | ORAL_CAPSULE | ORAL | Status: DC

## 2019-01-11 MED ORDER — ONDANSETRON HCL 4 MG PO TABS
4.0000 mg | ORAL_TABLET | Freq: Four times a day (QID) | ORAL | Status: DC | PRN
  Filled 2019-01-11: qty 1

## 2019-01-11 MED ORDER — HYDROMORPHONE HCL 1 MG/ML IJ SOLN
1.0000 mg | Freq: Once | INTRAMUSCULAR | Status: AC
  Administered 2019-01-11: 19:00:00 1 mg via INTRAVENOUS
  Filled 2019-01-11: qty 1

## 2019-01-11 MED ORDER — LORAZEPAM 2 MG/ML IJ SOLN
1.0000 mg | Freq: Four times a day (QID) | INTRAMUSCULAR | Status: DC | PRN
  Administered 2019-01-12: 04:00:00 1 mg via INTRAVENOUS
  Filled 2019-01-11: qty 1

## 2019-01-11 MED ORDER — DIPHENHYDRAMINE-ZINC ACETATE 2-0.1 % EX CREA: 1.0000 "application " | TOPICAL_CREAM | Freq: Three times a day (TID) | CUTANEOUS | Status: DC | PRN

## 2019-01-11 MED ORDER — FENTANYL CITRATE (PF) 100 MCG/2ML IJ SOLN
100.0000 ug | Freq: Once | INTRAMUSCULAR | Status: AC
  Administered 2019-01-11: 18:00:00 100 ug via INTRAVENOUS
  Filled 2019-01-11: qty 2

## 2019-01-11 MED ORDER — FOLIC ACID 1 MG PO TABS
1.0000 mg | ORAL_TABLET | Freq: Every day | ORAL | Status: DC
  Administered 2019-01-13 – 2019-01-20 (×8): 1 mg via ORAL
  Filled 2019-01-11 (×8): qty 1

## 2019-01-11 MED ORDER — THIAMINE HCL 100 MG/ML IJ SOLN
100.0000 mg | Freq: Every day | INTRAMUSCULAR | Status: DC
  Administered 2019-01-11 – 2019-01-13 (×3): 100 mg via INTRAVENOUS
  Filled 2019-01-11 (×7): qty 2

## 2019-01-11 MED ORDER — SODIUM POLYSTYRENE SULFONATE 15 GM/60ML PO SUSP: 15.0000 g | Freq: Once | ORAL | Status: DC

## 2019-01-11 MED ORDER — HEPARIN SODIUM (PORCINE) 5000 UNIT/ML IJ SOLN
5000.0000 [IU] | Freq: Three times a day (TID) | INTRAMUSCULAR | Status: DC
  Filled 2019-01-11: qty 1

## 2019-01-11 MED ORDER — HEPARIN (PORCINE) 25000 UT/250ML-% IV SOLN
950.0000 [IU]/h | INTRAVENOUS | Status: DC
  Administered 2019-01-12: 950 [IU]/h via INTRAVENOUS
  Filled 2019-01-11: qty 250

## 2019-01-11 MED ORDER — INSULIN ASPART 100 UNIT/ML ~~LOC~~ SOLN
0.0000 [IU] | Freq: Three times a day (TID) | SUBCUTANEOUS | Status: DC
  Administered 2019-01-13: 12:00:00 2 [IU] via SUBCUTANEOUS
  Administered 2019-01-15: 18:00:00 3 [IU] via SUBCUTANEOUS
  Administered 2019-01-16 – 2019-01-17 (×3): 2 [IU] via SUBCUTANEOUS
  Administered 2019-01-18: 1 [IU] via SUBCUTANEOUS
  Administered 2019-01-18: 2 [IU] via SUBCUTANEOUS
  Administered 2019-01-18: 1 [IU] via SUBCUTANEOUS
  Administered 2019-01-19: 5 [IU] via SUBCUTANEOUS
  Administered 2019-01-20: 12:00:00 1 [IU] via SUBCUTANEOUS

## 2019-01-11 MED ORDER — PANTOPRAZOLE SODIUM 40 MG PO TBEC: 40.0000 mg | DELAYED_RELEASE_TABLET | Freq: Every day | ORAL | Status: DC

## 2019-01-11 MED ORDER — LORAZEPAM 1 MG PO TABS
1.0000 mg | ORAL_TABLET | Freq: Four times a day (QID) | ORAL | Status: DC | PRN
  Administered 2019-01-11: 22:00:00 1 mg via ORAL
  Filled 2019-01-11: qty 1

## 2019-01-11 MED ORDER — SEVELAMER CARBONATE 800 MG PO TABS
1600.0000 mg | ORAL_TABLET | Freq: Three times a day (TID) | ORAL | Status: DC
  Administered 2019-01-13 – 2019-01-19 (×13): 1600 mg via ORAL
  Filled 2019-01-11 (×13): qty 2

## 2019-01-11 MED ORDER — ADULT MULTIVITAMIN W/MINERALS CH
1.0000 | ORAL_TABLET | Freq: Every day | ORAL | Status: DC
  Administered 2019-01-13 – 2019-01-20 (×8): 1 via ORAL
  Filled 2019-01-11 (×8): qty 1

## 2019-01-11 MED ORDER — NITROGLYCERIN 0.4 MG SL SUBL
0.4000 mg | SUBLINGUAL_TABLET | SUBLINGUAL | Status: DC | PRN
  Administered 2019-01-12 (×2): 0.4 mg via SUBLINGUAL
  Filled 2019-01-11 (×2): qty 1

## 2019-01-11 MED ORDER — ONDANSETRON HCL 4 MG/2ML IJ SOLN
4.0000 mg | Freq: Four times a day (QID) | INTRAMUSCULAR | Status: DC | PRN
  Administered 2019-01-12 – 2019-01-16 (×4): 4 mg via INTRAVENOUS
  Filled 2019-01-11 (×4): qty 2

## 2019-01-11 MED ORDER — VITAMIN B-1 100 MG PO TABS
100.0000 mg | ORAL_TABLET | Freq: Every day | ORAL | Status: DC
  Administered 2019-01-14 – 2019-01-20 (×7): 100 mg via ORAL
  Filled 2019-01-11 (×7): qty 1

## 2019-01-11 MED ORDER — HYDRALAZINE HCL 20 MG/ML IJ SOLN
5.0000 mg | INTRAMUSCULAR | Status: DC | PRN
  Administered 2019-01-12 – 2019-01-14 (×5): 5 mg via INTRAVENOUS
  Filled 2019-01-11 (×5): qty 1

## 2019-01-11 MED ORDER — OXYCODONE HCL 5 MG PO TABS: 5.0000 mg | ORAL_TABLET | ORAL | Status: DC | PRN

## 2019-01-11 MED ORDER — LORAZEPAM 2 MG/ML IJ SOLN
0.0000 mg | Freq: Four times a day (QID) | INTRAMUSCULAR | Status: DC
  Administered 2019-01-11: 23:00:00 1 mg via INTRAVENOUS
  Administered 2019-01-12: 09:00:00 4 mg via INTRAVENOUS
  Filled 2019-01-11: qty 1
  Filled 2019-01-11: qty 2

## 2019-01-11 MED ORDER — IPRATROPIUM BROMIDE 0.02 % IN SOLN
0.5000 mg | RESPIRATORY_TRACT | Status: DC
  Administered 2019-01-12 (×2): 0.5 mg via RESPIRATORY_TRACT
  Filled 2019-01-11 (×2): qty 2.5

## 2019-01-11 MED ORDER — LORAZEPAM 1 MG PO TABS: 1.0000 mg | ORAL_TABLET | Freq: Once | ORAL | Status: DC

## 2019-01-11 MED ORDER — LORAZEPAM 2 MG/ML IJ SOLN: 0.0000 mg | Freq: Two times a day (BID) | INTRAMUSCULAR | Status: DC

## 2019-01-11 MED ORDER — ASPIRIN EC 81 MG PO TBEC
81.0000 mg | DELAYED_RELEASE_TABLET | Freq: Every day | ORAL | Status: DC
  Administered 2019-01-11 – 2019-01-20 (×9): 81 mg via ORAL
  Filled 2019-01-11 (×9): qty 1

## 2019-01-11 MED ORDER — CHLORHEXIDINE GLUCONATE CLOTH 2 % EX PADS
6.0000 | MEDICATED_PAD | Freq: Every day | CUTANEOUS | Status: DC
  Administered 2019-01-12 – 2019-01-20 (×7): 6 via TOPICAL

## 2019-01-11 MED ORDER — LEVALBUTEROL HCL 0.63 MG/3ML IN NEBU: 0.6300 mg | INHALATION_SOLUTION | Freq: Four times a day (QID) | RESPIRATORY_TRACT | Status: DC | PRN

## 2019-01-11 MED ORDER — NICOTINE 21 MG/24HR TD PT24
21.0000 mg | MEDICATED_PATCH | Freq: Every day | TRANSDERMAL | Status: DC
  Administered 2019-01-11 – 2019-01-19 (×8): 21 mg via TRANSDERMAL
  Filled 2019-01-11 (×10): qty 1

## 2019-01-11 MED ORDER — AMLODIPINE BESYLATE 5 MG PO TABS: 5.0000 mg | ORAL_TABLET | Freq: Every day | ORAL | Status: DC

## 2019-01-11 MED ORDER — HEPARIN BOLUS VIA INFUSION
3500.0000 [IU] | Freq: Once | INTRAVENOUS | Status: AC
  Administered 2019-01-12: 3500 [IU] via INTRAVENOUS
  Filled 2019-01-11: qty 3500

## 2019-01-11 MED ORDER — DM-GUAIFENESIN ER 30-600 MG PO TB12: 1.0000 | ORAL_TABLET | Freq: Two times a day (BID) | ORAL | Status: DC | PRN

## 2019-01-11 MED ORDER — INSULIN GLARGINE 100 UNIT/ML ~~LOC~~ SOLN
3.0000 [IU] | Freq: Every day | SUBCUTANEOUS | Status: DC
  Filled 2019-01-11 (×3): qty 0.03

## 2019-01-11 NOTE — ED Notes (Signed)
Pt being placed on BiPap at this time.

## 2019-01-11 NOTE — ED Provider Notes (Signed)
[facility] EMERGENCY DEPARTMENT Provider Note   CSN: [id number] Arrival date & time: 01/11/19  1727    History   Chief Complaint Chief Complaint  Patient presents with  . Chest Pain    HPI Rhonda Lynch is a 63 y.o. female.     HPI  63 year old female with end-stage renal disease on dialysis presents with chest pain.  She last had dialysis about a week ago.  She states is been having diarrhea associated 1 ago.  She is having severe left-sided chest pain that she states started this morning.  She is feeling short of breath and having a cough that also started.  No fevers.  States she is never had this before.  Past Medical History:  Diagnosis Date  . Diabetes mellitus without complication ([place])   . ESRD (end stage renal disease) on dialysis ([place])    "TTS; [facility]; they're moving me to another one" (09/22/2018)  . Hepatitis C    diagnosed 09/2018  . Hypertension   . Oxygen deficiency    2 L at night  . Stroke ([place])   . Substance abuse ([place])    has been clean for 4 months     Patient Active Problem List   Diagnosis Date Noted  . Acute on chronic systolic CHF (congestive heart failure) ([place]) 01/11/2019  . Fluid overload 01/11/2019  . Diarrhea 12/28/2018  . Metabolic acidosis, increased anion gap (IAG) 12/19/2018  . Volvulus ([place]) 12/19/2018  . Hyperkalemia 11/25/2018  . Cocaine overdose, accidental or unintentional, initial encounter ([place]) 11/25/2018  . CAP (community acquired pneumonia) 11/17/2018  . End-stage renal disease on hemodialysis ([place])   . Hypoglycemia 10/23/2018  . Non-intractable vomiting   . Other ascites   . Palliative care encounter   . Left arm pain   . Chronic hepatitis C virus genotype 1b infection ([place]) 09/28/2018  . ICH (intracerebral hemorrhage) ([place]) small left caudate due to HTN 07/17/2018  . HCAP (healthcare-associated pneumonia) 06/21/2018  . Substance induced mood disorder ([place]) 05/15/2018  . Agitation   .  Hemodialysis catheter infection, initial encounter ([place])   . 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 Pulmonary embolism ([place]) 04/27/2018  . Sepsis ([place]) 04/26/2018  . Dyspnea 04/26/2018  . Chest pain 04/26/2018  . Tachycardia 04/26/2018  . Abdominal pain 04/18/2018  . Type II diabetes mellitus with renal manifestations ([place]) 04/18/2018  . Right flank pain 04/18/2018  . Acute respiratory failure with hypoxia ([place]) 04/12/2018  . SVT (supraventricular tachycardia) ([place])   . Chronic renal failure   . Hypertension   . Non-compliance with renal dialysis ([place])   . Acute pulmonary edema ([facility])   . Elevated troponin 03/19/2018  . Fever   . 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 Diffuse pulmonary alveolar hemorrhage   . Hypoxemia   . Pulmonary infiltrate   . 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 ESRD needing dialysis ([place]) 02/12/2018  . Acute respiratory failure ([place]) 02/12/2018  . Alcohol abuse 02/12/2018  . Microcytic anemia 02/12/2018  . Cocaine abuse ([place]) 02/12/2018  . Epigastric pain 02/12/2018  . Pneumonia 02/12/2018  . TOBACCO ABUSE 03/08/2010  . PNEUMONIA 03/08/2010    Past Surgical History:  Procedure Laterality Date  . AV FISTULA PLACEMENT Left 03/05/2018   Procedure: ARTERIOVENOUS (AV) FISTULA CREATION  LEFT UPPER EXTREMITY;  Surgeon: [name], MD;  Location: [facility] OR;  Service: Vascular;  Laterality: Left;  . AV FISTULA PLACEMENT Left 07/21/2018   Procedure: ARTERIOVENOUS (AV) FISTULA  CREATION;  Surgeon: Marty Heck, MD;  Location: [facility] OR;  Service: Vascular;  Laterality: Left;  . AV FISTULA PLACEMENT Left 10/27/2018   Procedure: INSERTION OF ARTERIOVENOUS (AV) GORE-TEX GRAFT UPPER ARM;  Surgeon: Marty Heck, MD;  Location: [facility];  Service: Vascular;  Laterality: Left;  . BASCILIC VEIN TRANSPOSITION Left 09/29/2018   Procedure: BASILIC VEIN TRANSPOSITION SECOND STAGE LEFT UPPER ARM;  Surgeon: Marty Heck, MD;  Location: [facility];  Service:  Vascular;  Laterality: Left;  . HEMATOMA EVACUATION Left 03/06/2018   Procedure: EVACUATION HEMATOMA LEFT ARM;  Surgeon: Angelia Mould, MD;  Location: [facility];  Service: Vascular;  Laterality: Left;  . I&D EXTREMITY Left 10/06/2018   Procedure: IRRIGATION AND DEBRIDEMENT ARM;  Surgeon: Marty Heck, MD;  Location: [facility];  Service: Vascular;  Laterality: Left;  . INSERTION OF DIALYSIS CATHETER Left 04/29/2018   Procedure: INSERTION OF DIALYSIS CATHETER LEFT INTERNAL JUGULAR;  Surgeon: Angelia Mould, MD;  Location: [facility];  Service: Vascular;  Laterality: Left;  . IR FLUORO GUIDE CV LINE RIGHT  02/18/2018  . IR FLUORO GUIDE CV LINE RIGHT  02/26/2018  . IR REMOVAL TUN CV CATH W/O FL  04/27/2018  . IR US GUIDE VASC ACCESS RIGHT  02/18/2018  . TEE WITHOUT CARDIOVERSION N/A 04/29/2018   Procedure: TRANSESOPHAGEAL ECHOCARDIOGRAM (TEE);  Surgeon: Sueanne Margarita, MD;  Location: [facility] ENDOSCOPY;  Service: Cardiovascular;  Laterality: N/A;  . THROMBECTOMY AND REVISION OF ARTERIOVENTOUS (AV) GORETEX  GRAFT Left 12/11/2018   Procedure: THROMBECTOMY AND  REVISION  ARTERIOVENTOUS GORETEX  GRAFT LEFT ARM;  Surgeon: [name], MD;  Location: [facility];  Service: Vascular;  Laterality: Left;  Marland Kitchen VENOGRAM Left 10/27/2018   Procedure: LEFT CENTRAL VENOGRAM;  Surgeon: Marty Heck, MD;  Location: [facility];  Service: Vascular;  Laterality: Left;     OB History   No obstetric history on file.      Home Medications    Prior to Admission medications   Medication Sig Start Date End Date Taking? Authorizing Provider  amLODipine (NORVASC) 5 MG tablet Take 1 tablet (5 mg total) by mouth at bedtime. 10/13/18   Alphonzo Grieve, MD  B Complex-C-Zn-Folic Acid (DIALYVITE 086-VHQI 15) 0.8 MG TABS Take 1 tablet by mouth daily. 11/04/18   [provider]  Blood Glucose Monitoring Suppl (ACCU-CHEK AVIVA) device Use as instructed 10/29/18 10/29/19  Isabelle Course, MD  calcitRIOL (ROCALTROL) 0.25 MCG  capsule Take 1 capsule (0.25 mcg total) by mouth every Monday, Wednesday, and Friday with hemodialysis. Patient taking differently: Take 0.25 mcg by mouth every Tuesday, Thursday, and Saturday at 6 PM.  10/29/18   Isabelle Course, MD  diphenhydrAMINE (BENADRYL) 2 % cream Apply 1 application topically 3 (three) times daily as needed for itching.    [provider]  docusate sodium (COLACE) 100 MG capsule Take 1 capsule (100 mg total) by mouth daily for 30 days. 12/29/18 01/28/19  Thurnell Lose, MD  fluticasone (FLONASE) 50 MCG/ACT nasal spray Place 2 sprays into both nostrils daily.    [provider]  glucose blood (ACCU-CHEK AVIVA) test strip Use twice per day. 10/29/18   Isabelle Course, MD  insulin aspart (NOVOLOG) 100 UNIT/ML injection Inject 5 Units into the skin 3 (three) times daily with meals. Patient taking differently: Inject 0-9 Units into the skin 3 (three) times daily with meals.  10/13/18   Alphonzo Grieve, MD  insulin glargine (LANTUS) 100 UNIT/ML injection  Inject 0.03 mLs (3 Units total) into the skin at bedtime. Patient taking differently: Inject 5 Units into the skin at bedtime.  10/29/18   Isabelle Course, MD  Insulin Syringe-Needle U-100 (INSULIN SYRINGE .3CC/31GX5/16") 31G X 5/16" 0.3 ML MISC Inject 1 each into the skin 4 (four) times daily. 06/27/18   Clent Demark, PA-C  Lancets (ACCU-CHEK SOFT [facility]) lancets Use twice per day. 10/29/18   Isabelle Course, MD  pantoprazole (PROTONIX) 40 MG tablet Take 1 tablet (40 mg total) by mouth daily. 11/21/18   Ghimire, Henreitta Leber, MD  polyethylene glycol (MIRALAX / GLYCOLAX) 17 g packet Take 17 g by mouth daily. 12/29/18   Thurnell Lose, MD  sevelamer carbonate (RENVELA) 800 MG tablet Take 2 tablets (1,600 mg total) by mouth 3 (three) times daily with meals. 11/26/18   Bonnell Public, MD    Family History Family History  Problem Relation Age of Onset  . Autoimmune disease Neg Hx     Social History Social  History   Tobacco Use  . Smoking status: Current Every Day Smoker    Packs/day: 0.50    Years: 48.00    Pack years: 24.00    Types: Cigarettes  . Smokeless tobacco: Never Used  . Tobacco comment: trying to quit ; smoking 1 1/2 cigarettes/day  (09/22/2018)  Substance Use Topics  . Alcohol use: Not Currently    Comment: 09/22/2018 "stopped about 6 months ago"  . Drug use: Yes    Types: Cocaine, Heroin    Comment: See notes     Allergies   Acetaminophen and Hydroxyzine   Review of Systems Review of Systems  Constitutional: Negative for fever.  Respiratory: Positive for cough and shortness of breath.   Cardiovascular: Positive for chest pain.  Gastrointestinal: Negative for vomiting.  All other systems reviewed and are negative.    Physical Exam Updated Vital Signs BP (!) 155/88   Pulse (!) 105   Temp 98.1 F (36.7 C)   Resp 13   SpO2 100%   Physical Exam Vitals signs and nursing note reviewed.  Constitutional:      General: She is in acute distress.     Appearance: She is well-developed.     Comments: In pain  HENT:     Head: Normocephalic and atraumatic.     Right Ear: External ear normal.     Left Ear: External ear normal.     Nose: Nose normal.  Eyes:     General:        Right eye: No discharge.        Left eye: No discharge.  Cardiovascular:     Rate and Rhythm: Regular rhythm. Tachycardia present.     Heart sounds: Normal heart sounds.  Pulmonary:     Effort: Pulmonary effort is normal.     Breath sounds: Normal breath sounds.  Chest:     Chest wall: Tenderness (left anterior chest) present.  Abdominal:     Palpations: Abdomen is soft.     Tenderness: There is no abdominal tenderness.  Skin:    General: Skin is warm and dry.  Neurological:     Mental Status: She is alert.  Psychiatric:        Mood and Affect: Mood is not anxious.      ED Treatments / Results  Labs (all labs ordered are listed, but only abnormal results are displayed)  Labs Reviewed  BASIC METABOLIC PANEL - Abnormal; Notable for the following  components:      Result Value   Potassium 5.5 (*)    CO2 18 (*)    Glucose, Bld 126 (*)    BUN 116 (*)    Creatinine, Ser 16.93 (*)    Calcium 7.3 (*)    GFR calc non Af Amer 2 (*)    GFR calc Af Amer 2 (*)    Anion gap 21 (*)    All other components within normal limits  TROPONIN I - Abnormal; Notable for the following components:   Troponin I 0.06 (*)    All other components within normal limits  CBC WITH DIFFERENTIAL/PLATELET - Abnormal; Notable for the following components:   RBC 3.79 (*)    Hemoglobin 9.0 (*)    HCT 29.3 (*)    MCV 77.3 (*)    MCH 23.7 (*)    RDW 22.5 (*)    All other components within normal limits  D-DIMER, QUANTITATIVE (NOT AT [facility]) - Abnormal; Notable for the following components:   D-Dimer, Quant 9.63 (*)    All other components within normal limits  TROPONIN I - Abnormal; Notable for the following components:   Troponin I 0.07 (*)    All other components within normal limits  LACTATE DEHYDROGENASE - Abnormal; Notable for the following components:   LDH 428 (*)    All other components within normal limits  POCT I-STAT EG7 - Abnormal; Notable for the following components:   pCO2, Ven 33.4 (*)    pO2, Ven 80.0 (*)    Acid-base deficit 4.0 (*)    Potassium 5.4 (*)    Calcium, Ion 0.84 (*)    HCT 31.0 (*)    Hemoglobin 10.5 (*)    All other components within normal limits  SARS CORONAVIRUS 2 (HOSPITAL ORDER, [facility])  PROTIME-INR  PATHOLOGIST SMEAR REVIEW  TROPONIN I  TROPONIN I  LIPID PANEL  RAPID URINE DRUG SCREEN, HOSP PERFORMED  BASIC METABOLIC PANEL  CBC  HEPARIN LEVEL (UNFRACTIONATED)    EKG EKG Interpretation  Date/Time:  Sunday Jan 11 2019 18:27:49 EDT Ventricular Rate:  111 PR Interval:    QRS Duration: 79 QT Interval:  340 QTC Calculation: 462 R Axis:   -38 Text Interpretation:  Sinus tachycardia Left ventricular  hypertrophy Anterior Q waves, possibly due to LVH Left anterior fasicular block When compared with ECG of 12/28/2018, No significant change was found Confirmed by Delora Fuel ([id number]) on 01/11/2019 11:39:46 PM   Radiology Dg Chest Portable 1 View  Result Date: 01/11/2019 CLINICAL DATA:  LEFT chest pain, hypoxia, history chronic kidney disease on dialysis EXAM: PORTABLE CHEST 1 VIEW COMPARISON:  Portable exam 1818 hours compared to 12/28/2018 FINDINGS: Enlargement of cardiac silhouette with pulmonary vascular congestion. Atherosclerotic calcification aorta. Scattered interstitial infiltrates likely pulmonary edema question CHF versus fluid overload. No gross pleural effusion or pneumothorax. Bones demineralized. IMPRESSION: Enlargement of cardiac silhouette with pulmonary vascular congestion and probable pulmonary edema. Electronically Signed   By: Lavonia Dana M.D.   On: 01/11/2019 18:54    Procedures Procedures (including critical care time)  Medications Ordered in ED Medications  nitroGLYCERIN (NITROSTAT) SL tablet 0.4 mg (has no administration in time range)  aspirin EC tablet 81 mg (81 mg Oral Given 01/11/19 2148)  oxyCODONE (Oxy IR/ROXICODONE) immediate release tablet 5 mg (has no administration in time range)  ipratropium (ATROVENT) nebulizer solution 0.5 mg (has no administration in time range)  levalbuterol (XOPENEX) nebulizer solution 0.63 mg (has no administration  in time range)  dextromethorphan-guaiFENesin (MUCINEX DM) 30-600 MG per 12 hr tablet 1 tablet (has no administration in time range)  insulin aspart (novoLOG) injection 0-9 Units (has no administration in time range)  nicotine (NICODERM CQ - dosed in mg/24 hours) patch 21 mg (21 mg Transdermal Patch Applied 01/11/19 2150)  LORazepam (ATIVAN) tablet 1 mg (1 mg Oral Given 01/11/19 2203)    Or  LORazepam (ATIVAN) injection 1 mg ( Intravenous See Alternative 01/11/19 2203)  thiamine (VITAMIN B-1) tablet 100 mg ( Oral See Alternative 01/11/19  2304)    Or  thiamine (B-1) injection 100 mg (100 mg Intravenous Given 0/2/63 7858)  folic acid (FOLVITE) tablet 1 mg (has no administration in time range)  multivitamin with minerals tablet 1 tablet (has no administration in time range)  LORazepam (ATIVAN) injection 0-4 mg (1 mg Intravenous Given 01/11/19 2301)    Followed by  LORazepam (ATIVAN) injection 0-4 mg (has no administration in time range)  ondansetron (ZOFRAN) tablet 4 mg (has no administration in time range)    Or  ondansetron (ZOFRAN) injection 4 mg (has no administration in time range)  hydrALAZINE (APRESOLINE) injection 5 mg (has no administration in time range)  LORazepam (ATIVAN) tablet 1 mg (has no administration in time range)  amLODipine (NORVASC) tablet 5 mg (has no administration in time range)  calcitRIOL (ROCALTROL) capsule 0.25 mcg (has no administration in time range)  insulin glargine (LANTUS) injection 3 Units (has no administration in time range)  pantoprazole (PROTONIX) EC tablet 40 mg (has no administration in time range)  sevelamer carbonate (RENVELA) tablet 1,600 mg (has no administration in time range)  fluticasone (FLONASE) 50 MCG/ACT nasal spray 2 spray (has no administration in time range)  diphenhydrAMINE-zinc acetate (BENADRYL) 8-5.0 % cream 1 application (has no administration in time range)  Chlorhexidine Gluconate Cloth 2 % PADS 6 each (has no administration in time range)  heparin bolus via infusion 3,500 Units (has no administration in time range)  heparin ADULT infusion 100 units/mL (25000 units/245mL sodium chloride 0.45%) (has no administration in time range)  fentaNYL (SUBLIMAZE) injection 100 mcg (100 mcg Intravenous Given 01/11/19 1801)  HYDROmorphone (DILAUDID) injection 1 mg (1 mg Intravenous Given 01/11/19 1854)     Initial Impression / Assessment and Plan / ED Course  I have reviewed the triage vital signs and the nursing notes.  Pertinent labs & imaging results that were available  during my care of the patient were reviewed by me and considered in my medical decision making (see chart for details).        Originally, the patient is screaming in pain, IV fentanyl did not do much and then she is much better after IV Dilaudid.  She is noted to have mild hypoxemia and requiring 3-4 L of oxygen.  Has acute pulmonary edema on chest x-ray, likely from her missed dialysis.  Given the sudden onset, while she does have cough, I think COVID-19 is unlikely though she will also be tested.  Likely this is all dialysis related.  She is breathing easier now that her pain is under control and does not appear to need acute airway management such as BiPAP or intubation.  I have consulted nephrology, Dr. Jonnie Finner, who is aware patient and hospitalist will admit.  Final Clinical Impressions(s) / ED Diagnoses   Final diagnoses:  Acute pulmonary edema [facility])    ED Discharge Orders    None       Sherwood Gambler, MD 01/12/19 0004

## 2019-01-11 NOTE — ED Notes (Signed)
Pt pulling leads and blood pressure cuff off.  Attempting to put back on, pt again pulling them off.

## 2019-01-11 NOTE — ED Notes (Signed)
Pt tolerating BiPap at this time.

## 2019-01-11 NOTE — H&P (Addendum)
History and Physical    Rhonda Lynch:[id number] DOB: [birth year] DOA: 01/11/2019  Referring MD/NP/PA:   PCP: Clent Demark, PA-C   Patient coming from:  The patient is coming from [place].  At baseline, pt is independent for most of ADL.        Chief Complaint: SOB and chest pain  HPI: Rhonda Lynch is a 63 y.o. female with medical history significant of ESRD-HD (TTS), DM, HTN, stroke, GERD, HCV, sCHF, polysubstance abuse (tobacco, alcohol, heroin, cocaine), chronic elevation of troponin, who presents with chest pain and shortness of breath.  Patient states that his symptoms started this morning, including chest pain, shortness of breath and cough.  His chest pain is located in the left side of chest, initially 10 out of 10 severity, currently 8 out of 10 severity, sharp, nonradiating, pleuritic, aggravated by deep breaths. Patient has cough with little mucus production.  Denies fever, chills, runny nose or sore throat.  Patient states that he has some loose stool bowel movement, but no nausea vomiting, abdominal pain.  No symptoms of UTI or unilateral weakness. His last HD was a week ago  ED Course: pt was found to have Negative COVID-19 test, D-dimer 9.63, troponin 0.06, WBC 9.2 (CBC showed shcistocytes, pending smear) potassium 5.4, bicarbonate 18, creatinine 16.9, BUN 116, temperature normal, tachycardia, tachypnea, oxygen saturation 70s at RA per EMS-->100 on 4L nasal cannula oxygen, chest x-ray showed cardiomegaly and pulmonary edema.    Patient is placed on telemetry bed of observation.   Review of Systems:   General: no fevers, chills, no body weight gain, has fatigue HEENT: no blurry vision, hearing changes or sore throat Respiratory: has dyspnea, coughing, no wheezing CV: has chest pain, no palpitations GI: no nausea, vomiting, abdominal pain, has loose stool, no constipation GU: no dysuria, burning on urination, increased urinary frequency, hematuria  Ext: no  leg edema Neuro: no unilateral weakness, numbness, or tingling, no vision change or hearing loss Skin: no rash, no skin tear. MSK: No muscle spasm, no deformity, no limitation of range of movement in spin Heme: No easy bruising.  Travel history: No recent long distant travel.  Allergy:  Allergies  Allergen Reactions  . Acetaminophen Nausea And Vomiting    Patient states Acetaminophen and Acetaminophen containing products make her nauseated. She demonstrated this 06/21/18 with nausea followed by emesis.  Marland Kitchen Hydroxyzine Itching    Past Medical History:  Diagnosis Date  . Diabetes mellitus without complication ([place])   . ESRD (end stage renal disease) on dialysis ([place])    "TTS; [facility]; they're moving me to another one" (09/22/2018)  . Hepatitis C    diagnosed 09/2018  . Hypertension   . Oxygen deficiency    2 L at night  . Stroke ([place])   . Substance abuse ([place])    has been clean for 4 months     Past Surgical History:  Procedure Laterality Date  . AV FISTULA PLACEMENT Left 03/05/2018   Procedure: ARTERIOVENOUS (AV) FISTULA CREATION  LEFT UPPER EXTREMITY;  Surgeon: [name], MD;  Location: [place];  Service: Vascular;  Laterality: Left;  . AV FISTULA PLACEMENT Left 07/21/2018   Procedure: ARTERIOVENOUS (AV) FISTULA CREATION;  Surgeon: Marty Heck, MD;  Location: [place];  Service: Vascular;  Laterality: Left;  . AV FISTULA PLACEMENT Left 10/27/2018   Procedure: INSERTION OF ARTERIOVENOUS (AV) GORE-TEX GRAFT UPPER ARM;  Surgeon: Marty Heck, MD;  Location: [place];  Service: Vascular;  Laterality: Left;  . BASCILIC VEIN TRANSPOSITION Left 09/29/2018   Procedure: BASILIC VEIN TRANSPOSITION SECOND STAGE LEFT UPPER ARM;  Surgeon: Marty Heck, MD;  Location: [place];  Service: Vascular;  Laterality: Left;  . HEMATOMA EVACUATION Left 03/06/2018   Procedure: EVACUATION HEMATOMA LEFT ARM;  Surgeon: Angelia Mould, MD;  Location: [place];  Service: Vascular;   Laterality: Left;  . I&D EXTREMITY Left 10/06/2018   Procedure: IRRIGATION AND DEBRIDEMENT ARM;  Surgeon: Marty Heck, MD;  Location: [place];  Service: Vascular;  Laterality: Left;  . INSERTION OF DIALYSIS CATHETER Left 04/29/2018   Procedure: INSERTION OF DIALYSIS CATHETER LEFT INTERNAL JUGULAR;  Surgeon: Angelia Mould, MD;  Location: [place];  Service: Vascular;  Laterality: Left;  . IR FLUORO GUIDE CV LINE RIGHT  02/18/2018  . IR FLUORO GUIDE CV LINE RIGHT  02/26/2018  . IR REMOVAL TUN CV CATH W/O FL  04/27/2018  . IR US GUIDE VASC ACCESS RIGHT  02/18/2018  . TEE WITHOUT CARDIOVERSION N/A 04/29/2018   Procedure: TRANSESOPHAGEAL ECHOCARDIOGRAM (TEE);  Surgeon: Sueanne Margarita, MD;  Location: [facility] ENDOSCOPY;  Service: Cardiovascular;  Laterality: N/A;  . THROMBECTOMY AND REVISION OF ARTERIOVENTOUS (AV) GORETEX  GRAFT Left 12/11/2018   Procedure: THROMBECTOMY AND  REVISION  ARTERIOVENTOUS GORETEX  GRAFT LEFT ARM;  Surgeon: [name], MD;  Location: [place];  Service: Vascular;  Laterality: Left;  Marland Kitchen VENOGRAM Left 10/27/2018   Procedure: LEFT CENTRAL VENOGRAM;  Surgeon: Marty Heck, MD;  Location: [place];  Service: Vascular;  Laterality: Left;    Social History:  reports that she has been smoking cigarettes. She has a 24.00 pack-year smoking history. She has never used smokeless tobacco. She reports previous alcohol use. She reports current drug use. Drugs: Cocaine and Heroin.  Family History:  Family History  Problem Relation Age of Onset  . Autoimmune disease Neg Hx      Prior to Admission medications   Medication Sig Start Date End Date Taking? Authorizing Provider  amLODipine (NORVASC) 5 MG tablet Take 1 tablet (5 mg total) by mouth at bedtime. 10/13/18   Alphonzo Grieve, MD  B Complex-C-Zn-Folic Acid (DIALYVITE 270-BEML 15) 0.8 MG TABS Take 1 tablet by mouth daily. 11/04/18   [provider]  Blood Glucose Monitoring Suppl (ACCU-CHEK AVIVA) device Use as instructed  10/29/18 10/29/19  Isabelle Course, MD  calcitRIOL (ROCALTROL) 0.25 MCG capsule Take 1 capsule (0.25 mcg total) by mouth every Monday, Wednesday, and Friday with hemodialysis. Patient taking differently: Take 0.25 mcg by mouth every Tuesday, Thursday, and Saturday at 6 PM.  10/29/18   Isabelle Course, MD  diphenhydrAMINE (BENADRYL) 2 % cream Apply 1 application topically 3 (three) times daily as needed for itching.    [provider]  docusate sodium (COLACE) 100 MG capsule Take 1 capsule (100 mg total) by mouth daily for 30 days. 12/29/18 01/28/19  Thurnell Lose, MD  fluticasone (FLONASE) 50 MCG/ACT nasal spray Place 2 sprays into both nostrils daily.    [provider]  glucose blood (ACCU-CHEK AVIVA) test strip Use twice per day. 10/29/18   Isabelle Course, MD  insulin aspart (NOVOLOG) 100 UNIT/ML injection Inject 5 Units into the skin 3 (three) times daily with meals. Patient taking differently: Inject 0-9 Units into the skin 3 (three) times daily with meals.  10/13/18   Alphonzo Grieve, MD  insulin glargine (LANTUS) 100 UNIT/ML injection Inject 0.03 mLs (3 Units total) into the skin  at bedtime. Patient taking differently: Inject 5 Units into the skin at bedtime.  10/29/18   Isabelle Course, MD  Insulin Syringe-Needle U-100 (INSULIN SYRINGE .3CC/31GX5/16") 31G X 5/16" 0.3 ML MISC Inject 1 each into the skin 4 (four) times daily. 06/27/18   Clent Demark, PA-C  Lancets (ACCU-CHEK SOFT [facility]) lancets Use twice per day. 10/29/18   Isabelle Course, MD  pantoprazole (PROTONIX) 40 MG tablet Take 1 tablet (40 mg total) by mouth daily. 11/21/18   Ghimire, Henreitta Leber, MD  polyethylene glycol (MIRALAX / GLYCOLAX) 17 g packet Take 17 g by mouth daily. 12/29/18   Thurnell Lose, MD  sevelamer carbonate (RENVELA) 800 MG tablet Take 2 tablets (1,600 mg total) by mouth 3 (three) times daily with meals. 11/26/18   Bonnell Public, MD  vancomycin (VANCOCIN) 125 MG capsule Take 1 capsule (125 mg  total) by mouth 4 (four) times daily. Patient not taking: Reported on 12/18/2018 12/12/18   Caren Griffins, MD    Physical Exam: Vitals:   01/11/19 1839 01/11/19 2130 01/11/19 2159 01/11/19 2219  BP: (!) 167/95 (!) 162/86    Pulse: (!) 105   (!) 105  Resp: (!) 23 14  (!) 23  Temp: 98.1 F (36.7 C)     SpO2: 100%  91% 100%   General: Not in acute distress HEENT:       Eyes: PERRL, EOMI, no scleral icterus.       ENT: No discharge from the ears and nose, no pharynx injection, no tonsillar enlargement.        Neck: No JVD, no bruit, no mass felt. Heme: No neck lymph node enlargement. Cardiac: S1/S2, RRR, No murmurs, No gallops or rubs. Respiratory: No rales, wheezing, rhonchi or rubs. GI: Soft, nondistended, nontender, no rebound pain, no organomegaly, BS present. GU: No hematuria Ext: No pitting leg edema bilaterally. 2+DP/PT pulse bilaterally. Musculoskeletal: No joint deformities, No joint redness or warmth, no limitation of ROM in spin. Skin: No rashes.  Neuro: Alert, oriented X3, cranial nerves II-XII grossly intact, moves all extremities normally.  Psych: Patient is not psychotic, no suicidal or hemocidal ideation.  Labs on Admission: I have personally reviewed following labs and imaging studies  CBC: Recent Labs  Lab 01/11/19 1755 01/11/19 1800  WBC 9.2  --   NEUTROABS 6.7  --   HGB 9.0* 10.5*  HCT 29.3* 31.0*  MCV 77.3*  --   PLT 310  --    Basic Metabolic Panel: Recent Labs  Lab 01/11/19 1755 01/11/19 1800  NA 142 141  K 5.5* 5.4*  CL 103  --   CO2 18*  --   GLUCOSE 126*  --   BUN 116*  --   CREATININE 16.93*  --   CALCIUM 7.3*  --    GFR: Estimated Creatinine Clearance: 2.5 mL/min (A) (by C-G formula based on SCr of 16.93 mg/dL (H)). Liver Function Tests: No results for input(s): AST, ALT, ALKPHOS, BILITOT, PROT, ALBUMIN in the last 168 hours. No results for input(s): LIPASE, AMYLASE in the last 168 hours. No results for input(s): AMMONIA in the  last 168 hours. Coagulation Profile: Recent Labs  Lab 01/11/19 2043  INR 1.2   Cardiac Enzymes: Recent Labs  Lab 01/11/19 1755 01/11/19 2043  TROPONINI 0.06* 0.07*   BNP (last 3 results) No results for input(s): PROBNP in the last 8760 hours. HbA1C: No results for input(s): HGBA1C in the last 72 hours. CBG: No results for input(s):  GLUCAP in the last 168 hours. Lipid Profile: No results for input(s): CHOL, HDL, LDLCALC, TRIG, CHOLHDL, LDLDIRECT in the last 72 hours. Thyroid Function Tests: No results for input(s): TSH, T4TOTAL, FREET4, T3FREE, THYROIDAB in the last 72 hours. Anemia Panel: No results for input(s): VITAMINB12, FOLATE, FERRITIN, TIBC, IRON, RETICCTPCT in the last 72 hours. Urine analysis:    Component Value Date/Time   COLORURINE YELLOW 12/03/2018 1208   APPEARANCEUR HAZY (A) 12/03/2018 1208   LABSPEC 1.020 12/03/2018 1208   PHURINE 6.0 12/03/2018 1208   GLUCOSEU 100 (A) 12/03/2018 1208   HGBUR LARGE (A) 12/03/2018 1208   HGBUR trace-intact 03/08/2010 1452   BILIRUBINUR NEGATIVE 12/03/2018 [place] 12/03/2018 1208   PROTEINUR >300 (A) 12/03/2018 1208   UROBILINOGEN 0.2 03/08/2010 1452   NITRITE NEGATIVE 12/03/2018 1208   LEUKOCYTESUR TRACE (A) 12/03/2018 1208   Sepsis Labs: @LABRCNTIP (procalcitonin:4,lacticidven:4) ) Recent Results (from the past 240 hour(s))  SARS Coronavirus 2 (CEPHEID- Performed in [facility] hospital lab), Hosp Order     Status: None   Collection Time: 01/11/19  7:57 PM  Result Value Ref Range Status   SARS Coronavirus 2 NEGATIVE NEGATIVE Final    Comment: (NOTE) If result is NEGATIVE SARS-CoV-2 target nucleic acids are NOT DETECTED. The SARS-CoV-2 RNA is generally detectable in upper and lower  respiratory specimens during the acute phase of infection. The lowest  concentration of SARS-CoV-2 viral copies this assay can detect is 250  copies / mL. A negative result does not preclude SARS-CoV-2 infection   and should not be used as the sole basis for treatment or other  patient management decisions.  A negative result may occur with  improper specimen collection / handling, submission of specimen other  than nasopharyngeal swab, presence of viral mutation(s) within the  areas targeted by this assay, and inadequate number of viral copies  (<250 copies / mL). A negative result must be combined with clinical  observations, patient history, and epidemiological information. If result is POSITIVE SARS-CoV-2 target nucleic acids are DETECTED. The SARS-CoV-2 RNA is generally detectable in upper and lower  respiratory specimens dur ing the acute phase of infection.  Positive  results are indicative of active infection with SARS-CoV-2.  Clinical  correlation with patient history and other diagnostic information is  necessary to determine patient infection status.  Positive results do  not rule out bacterial infection or co-infection with other viruses. If result is PRESUMPTIVE POSTIVE SARS-CoV-2 nucleic acids MAY BE PRESENT.   A presumptive positive result was obtained on the submitted specimen  and confirmed on repeat testing.  While 2019 novel coronavirus  (SARS-CoV-2) nucleic acids may be present in the submitted sample  additional confirmatory testing may be necessary for epidemiological  and / or clinical management purposes  to differentiate between  SARS-CoV-2 and other Sarbecovirus currently known to infect humans.  If clinically indicated additional testing with an alternate test  methodology [phone]) is advised. The SARS-CoV-2 RNA is generally  detectable in upper and lower respiratory sp ecimens during the acute  phase of infection. The expected result is Negative. Fact Sheet for Patients:  StrictlyIdeas.no Fact Sheet for Healthcare Providers: BankingDealers.co.za This test is not yet approved or cleared by the [place] FDA and has  been authorized for detection and/or diagnosis of SARS-CoV-2 by FDA under an Emergency Use Authorization (EUA).  This EUA will remain in effect (meaning this test can be used) for the duration of the COVID-19 declaration under Section 564(b)(1) of  the Act, 21 U.S.C. section 360bbb-3(b)(1), unless the authorization is terminated or revoked sooner. Performed at [facility], [place] [address] [place], [place] [address]      Radiological Exams on Admission: Dg Chest Portable 1 View  Result Date: 01/11/2019 CLINICAL DATA:  LEFT chest pain, hypoxia, history chronic kidney disease on dialysis EXAM: PORTABLE CHEST 1 VIEW COMPARISON:  Portable exam 1818 hours compared to 12/28/2018 FINDINGS: Enlargement of cardiac silhouette with pulmonary vascular congestion. Atherosclerotic calcification aorta. Scattered interstitial infiltrates likely pulmonary edema question CHF versus fluid overload. No gross pleural effusion or pneumothorax. Bones demineralized. IMPRESSION: Enlargement of cardiac silhouette with pulmonary vascular congestion and probable pulmonary edema. Electronically Signed   By: Lavonia Dana M.D.   On: 01/11/2019 18:54     EKG: Independently reviewed.  Sinus rhythm, QTC 462, LAE, anteroseptal infarction pattern, LAD, poor R wave progression, T wave peaking in V3-V5.  Assessment/Plan Principal Problem:   Acute pulmonary edema (HCC) Active Problems:   TOBACCO ABUSE   Alcohol abuse   Cocaine abuse (HCC)   Elevated troponin   Hypertension   Acute respiratory failure with hypoxia (HCC)   Type II diabetes mellitus with renal manifestations (HCC)   Chest pain   End-stage renal disease on hemodialysis (HCC)   Hyperkalemia   Acute on chronic systolic CHF (congestive heart failure) (HCC)   Fluid overload    Acute respiratory failure with hypoxia due to acute pulmonary edema/ Acute on chronic systolic CHF in the setting of dialysis noncompliance: Chest x-ray showed pulmonary edema.  2D  echo on 04/29/2018 showed EF of 40-45%. Renal, Dr. Jonnie Finner was consulted for HD by EDP.  Another potential differential diagnosis is PE.  Patient has history of PE on her medical problem list, and her d-dimer is 9.6.  Patient will need CT angiogram to rule out PE.  Per Dr. Jonnie Finner, patient needs urgent dialysis, therefore we will start IV heparin and get CT angiogram after finishing dialysis.  -will place on tele bed for obs -atrovent inhaler, prn Xopnex inhaler and prn mucinex -urgent HD per renal -Nasal cannula oxygen to maintain oxygen saturation above 93%, currently on 4 L nasal cannula oxygen with oxygen saturation 100%. -IV heparin per pharm  Elevated troponin and chest pain: Patient has pleuritic chest pain.  Given history of PE and positive d-dimer 9.6, will need to rule out PE.  Patient has chronic elevation of troponin, baseline 0.4-0.18.  Her troponin is 0.06.  Elevated troponin is most likely due to end-stage renal disease. -Start IV heparin and get CT angiogram after finishing dialysis as above -ASA 81 mg daily -IV heparin -CTA to r/o PE after finishing HD -trop x 3 -FLP and UDS (A1c was 4.8 on 12/29/18) -LE dopper to r/o DVT due to positive D-dimer   Polysubstance abuse: Including tobacco, alcohol, heroin and cocaine abuse. -Did counseling about importance of quitting substance -CIWA protocol -Nicotine patch -check UDS  HTN:  -Continue home medications: Amlodipine -IV hydralazine prn  Type II diabetes mellitus with renal manifestations ([place]): Last A1c 4.8 on 12/28/08, well controled. Patient is taking lantus and nonolog at home -will decrease Lantus dose from 5 to 3 units daily -SSI  End-stage renal disease on hemodialysis (TTS) and uremia: pt missed HD for 1 week. Now has uremia and pulmonary edema. 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  Etiology  is not clear. -LDH -peripheral smear -Need to give referral to hematology.  COVID19 test: negative     DVT ppx: SQ Heparin -->on IV Heparin, pending CTA  Code Status: Full code Family Communication: None at bed side.   Disposition Plan:  To be determined Consults called:  Renal, Dr. Jonnie Finner Admission status: Obs / tele    Date of Service 01/11/2019    [facility]   If 7PM-7AM, please contact night-coverage [email or web address] Password [email or web address] 01/11/2019, 10:26 PM

## 2019-01-11 NOTE — ED Notes (Signed)
ED TO INPATIENT HANDOFF REPORT  ED Nurse Name and Phone #: Daralene Milch, RN  [phone]  S Name/Age/Gender Rhonda Lynch 63 y.o. female Room/Bed: 029C/029C  Code Status   Code Status: Full Code  Home/SNF/Other Home Patient oriented to: self, place, time and situation  Is this baseline? Yes   Triage Complete: Triage complete  Chief Complaint Chest Pain - Missed Dialysis  Triage Note Pt arrives via ems from [facility] with complaints of cp onset this afternoon. Pt with CKD on dialysis. Last treatment was a week ago. Pt also complains of cough X3 days. Afebrile with ems. 170/110, HR 110, RR 32, 73% RA, 100% NRB, 98.1, CBG 126. 324mg  asa and 2 SL nitroglycerin given en route. Pt unable to sit still on ems stretcher. Pt appears anxious.    Allergies Allergies  Allergen Reactions  . Acetaminophen Nausea And Vomiting    Patient states Acetaminophen and Acetaminophen containing products make her nauseated. She demonstrated this 06/21/18 with nausea followed by emesis.  Marland Kitchen Hydroxyzine Itching    Level of Care/Admitting Diagnosis ED Disposition    ED Disposition Condition [facility] Area: [place] [[id number]]  Level of Care: Progressive [102]  I expect the patient will be discharged within 24 hours: No (not a candidate for 5C-Observation unit)  Covid Evaluation: N/A  Diagnosis: Fluid overload [494496]  Admitting Physician: Ivor Costa [[id number]]  Attending Physician: Ivor Costa [[id number]]  Bed request comments: COVID19 negative  PT Class (Do Not Modify): Observation [104]  PT Acc Code (Do Not Modify): Observation [10022]       B Medical/Surgery History Past Medical History:  Diagnosis Date  . Diabetes mellitus without complication ([place])   . ESRD (end stage renal disease) on dialysis ([place])    "TTS; [facility]; they're moving me to another one" (09/22/2018)  . Hepatitis C    diagnosed 09/2018  . Hypertension   . Oxygen deficiency    2 L at  night  . Stroke ([place])   . Substance abuse ([place])    has been clean for 4 months    Past Surgical History:  Procedure Laterality Date  . AV FISTULA PLACEMENT Left 03/05/2018   Procedure: ARTERIOVENOUS (AV) FISTULA CREATION  LEFT UPPER EXTREMITY;  Surgeon: [name], MD;  Location: [place];  Service: Vascular;  Laterality: Left;  . AV FISTULA PLACEMENT Left 07/21/2018   Procedure: ARTERIOVENOUS (AV) FISTULA CREATION;  Surgeon: Marty Heck, MD;  Location: [place];  Service: Vascular;  Laterality: Left;  . AV FISTULA PLACEMENT Left 10/27/2018   Procedure: INSERTION OF ARTERIOVENOUS (AV) GORE-TEX GRAFT UPPER ARM;  Surgeon: Marty Heck, MD;  Location: [place];  Service: Vascular;  Laterality: Left;  . BASCILIC VEIN TRANSPOSITION Left 09/29/2018   Procedure: BASILIC VEIN TRANSPOSITION SECOND STAGE LEFT UPPER ARM;  Surgeon: Marty Heck, MD;  Location: [place];  Service: Vascular;  Laterality: Left;  . HEMATOMA EVACUATION Left 03/06/2018   Procedure: EVACUATION HEMATOMA LEFT ARM;  Surgeon: Angelia Mould, MD;  Location: [place];  Service: Vascular;  Laterality: Left;  . I&D EXTREMITY Left 10/06/2018   Procedure: IRRIGATION AND DEBRIDEMENT ARM;  Surgeon: Marty Heck, MD;  Location: [place];  Service: Vascular;  Laterality: Left;  . INSERTION OF DIALYSIS CATHETER Left 04/29/2018   Procedure: INSERTION OF DIALYSIS CATHETER LEFT INTERNAL JUGULAR;  Surgeon: Angelia Mould, MD;  Location: [place];  Service: Vascular;  Laterality: Left;  . IR FLUORO  GUIDE CV LINE RIGHT  02/18/2018  . IR FLUORO GUIDE CV LINE RIGHT  02/26/2018  . IR REMOVAL TUN CV CATH W/O FL  04/27/2018  . IR US GUIDE VASC ACCESS RIGHT  02/18/2018  . TEE WITHOUT CARDIOVERSION N/A 04/29/2018   Procedure: TRANSESOPHAGEAL ECHOCARDIOGRAM (TEE);  Surgeon: Sueanne Margarita, MD;  Location: [facility] ENDOSCOPY;  Service: Cardiovascular;  Laterality: N/A;  . THROMBECTOMY AND REVISION OF ARTERIOVENTOUS (AV) GORETEX  GRAFT Left  12/11/2018   Procedure: THROMBECTOMY AND  REVISION  ARTERIOVENTOUS GORETEX  GRAFT LEFT ARM;  Surgeon: [name], MD;  Location: [place];  Service: Vascular;  Laterality: Left;  Marland Kitchen VENOGRAM Left 10/27/2018   Procedure: LEFT CENTRAL VENOGRAM;  Surgeon: Marty Heck, MD;  Location: [facility] OR;  Service: Vascular;  Laterality: Left;     A IV Location/Drains/Wounds Patient Lines/Drains/Airways Status   Active Line/Drains/Airways    Name:   Placement date:   Placement time:   Site:   Days:   Peripheral IV 01/11/19 Right;Upper Arm   01/11/19    2247    Arm   less than 1   Fistula / Graft Left Upper arm Arteriovenous vein graft   10/27/18    1200    Upper arm   76   Fistula / Graft Left Upper arm Arteriovenous vein graft   12/11/18    1107    Upper arm   31   Hemodialysis Catheter Right Subclavian Double-lumen   -    -    Subclavian      Incision (Closed) 12/11/18 Arm Left   12/11/18    1035     31          Intake/Output Last 24 hours No intake or output data in the 24 hours ending 01/11/19 2251  Labs/Imaging Results for orders placed or performed during the hospital encounter of 01/11/19 (from the past 48 hour(s))  Basic metabolic panel     Status: Abnormal   Collection Time: 01/11/19  5:55 PM  Result Value Ref Range   Sodium 142 135 - 145 mmol/L   Potassium 5.5 (H) 3.5 - 5.1 mmol/L   Chloride 103 98 - 111 mmol/L   CO2 18 (L) 22 - 32 mmol/L   Glucose, Bld 126 (H) 70 - 99 mg/dL   BUN 116 (H) 8 - 23 mg/dL   Creatinine, Ser 16.93 (H) 0.44 - 1.00 mg/dL   Calcium 7.3 (L) 8.9 - 10.3 mg/dL   GFR calc non Af Amer 2 (L) >60 mL/min   GFR calc Af Amer 2 (L) >60 mL/min   Anion gap 21 (H) 5 - 15    Comment: Performed at [facility], [address] [place], [place] [address]  Troponin I - Once     Status: Abnormal   Collection Time: 01/11/19  5:55 PM  Result Value Ref Range   Troponin I 0.06 (HH) <0.03 ng/mL    Comment: CRITICAL RESULT CALLED TO, READ BACK BY AND VERIFIED  WITH: Arlita Buffkin, K RN @ 1905 ON 01/11/2019 BY TEMOCHE, H Performed at [facility], [place] [address], [place], [place] [address]   CBC with Differential     Status: Abnormal   Collection Time: 01/11/19  5:55 PM  Result Value Ref Range   WBC 9.2 4.0 - 10.5 K/uL   RBC 3.79 (L) 3.87 - 5.11 MIL/uL   Hemoglobin 9.0 (L) 12.0 - 15.0 g/dL   HCT 29.3 (L) 36.0 - 46.0 %  MCV 77.3 (L) 80.0 - 100.0 fL   MCH 23.7 (L) 26.0 - 34.0 pg   MCHC 30.7 30.0 - 36.0 g/dL   RDW 22.5 (H) 11.5 - 15.5 %   Platelets 310 150 - 400 K/uL    Comment: REPEATED TO VERIFY SPECIMEN CHECKED FOR CLOTS    nRBC 0.2 0.0 - 0.2 %   Neutrophils Relative % 73 %   Neutro Abs 6.7 1.7 - 7.7 K/uL   Lymphocytes Relative 16 %   Lymphs Abs 1.5 0.7 - 4.0 K/uL   Monocytes Relative 10 %   Monocytes Absolute 0.9 0.1 - 1.0 K/uL   Eosinophils Relative 1 %   Eosinophils Absolute 0.1 0.0 - 0.5 K/uL   Basophils Relative 0 %   Basophils Absolute 0.0 0.0 - 0.1 K/uL   Immature Granulocytes 0 %   Abs Immature Granulocytes 0.04 0.00 - 0.07 K/uL   Schistocytes PRESENT     Comment: Performed at [facility], [address], [place], [place] [address]  POCT I-Stat EG7     Status: Abnormal   Collection Time: 01/11/19  6:00 PM  Result Value Ref Range   pH, Ven 7.397 7.250 - 7.430   pCO2, Ven 33.4 (L) 44.0 - 60.0 mmHg   pO2, Ven 80.0 (H) 32.0 - 45.0 mmHg   Bicarbonate 20.5 20.0 - 28.0 mmol/L   TCO2 22 22 - 32 mmol/L   O2 Saturation 96.0 %   Acid-base deficit 4.0 (H) 0.0 - 2.0 mmol/L   Sodium 141 135 - 145 mmol/L   Potassium 5.4 (H) 3.5 - 5.1 mmol/L   Calcium, Ion 0.84 (LL) 1.15 - 1.40 mmol/L   HCT 31.0 (L) 36.0 - 46.0 %   Hemoglobin 10.5 (L) 12.0 - 15.0 g/dL   Patient temperature HIDE    Sample type VENOUS    Comment NOTIFIED PHYSICIAN   SARS Coronavirus 2 (CEPHEID- Performed in [place] hospital lab), Hosp Order     Status: None   Collection Time: 01/11/19  7:57 PM  Result Value Ref Range   SARS Coronavirus 2 NEGATIVE  NEGATIVE    Comment: (NOTE) If result is NEGATIVE SARS-CoV-2 target nucleic acids are NOT DETECTED. The SARS-CoV-2 RNA is generally detectable in upper and lower  respiratory specimens during the acute phase of infection. The lowest  concentration of SARS-CoV-2 viral copies this assay can detect is 250  copies / mL. A negative result does not preclude SARS-CoV-2 infection  and should not be used as the sole basis for treatment or other  patient management decisions.  A negative result may occur with  improper specimen collection / handling, submission of specimen other  than nasopharyngeal swab, presence of viral mutation(s) within the  areas targeted by this assay, and inadequate number of viral copies  (<250 copies / mL). A negative result must be combined with clinical  observations, patient history, and epidemiological information. If result is POSITIVE SARS-CoV-2 target nucleic acids are DETECTED. The SARS-CoV-2 RNA is generally detectable in upper and lower  respiratory specimens dur ing the acute phase of infection.  Positive  results are indicative of active infection with SARS-CoV-2.  Clinical  correlation with patient history and other diagnostic information is  necessary to determine patient infection status.  Positive results do  not rule out bacterial infection or co-infection with other viruses. If result is PRESUMPTIVE POSTIVE SARS-CoV-2 nucleic acids MAY BE PRESENT.   A presumptive positive result was obtained on the submitted specimen  and confirmed  on repeat testing.  While 2019 novel coronavirus  (SARS-CoV-2) nucleic acids may be present in the submitted sample  additional confirmatory testing may be necessary for epidemiological  and / or clinical management purposes  to differentiate between  SARS-CoV-2 and other Sarbecovirus currently known to infect humans.  If clinically indicated additional testing with an alternate test  methodology [id number]) is advised. The  SARS-CoV-2 RNA is generally  detectable in upper and lower respiratory sp ecimens during the acute  phase of infection. The expected result is Negative. Fact Sheet for Patients:  StrictlyIdeas.no Fact Sheet for Healthcare Providers: BankingDealers.co.za This test is not yet approved or cleared by the [place] FDA and has been authorized for detection and/or diagnosis of SARS-CoV-2 by FDA under an Emergency Use Authorization (EUA).  This EUA will remain in effect (meaning this test can be used) for the duration of the COVID-19 declaration under Section 564(b)(1) of the Act, 21 U.S.C. section 360bbb-3(b)(1), unless the authorization is terminated or revoked sooner. Performed at [facility], [place] [address], [place], [place] [address]   D-dimer, quantitative (not at [facility])     Status: Abnormal   Collection Time: 01/11/19  8:43 PM  Result Value Ref Range   D-Dimer, Quant 9.63 (H) 0.00 - 0.50 ug/mL-FEU    Comment: (NOTE) At the manufacturer cut-off of 0.50 ug/mL FEU, this assay has been documented to exclude PE with a sensitivity and negative predictive value of 97 to 99%.  At this time, this assay has not been approved by the FDA to exclude DVT/VTE. Results should be correlated with clinical presentation. Performed at [facility], [place] [address], [place], [place] [address]   Troponin I - Now Then Q6H     Status: Abnormal   Collection Time: 01/11/19  8:43 PM  Result Value Ref Range   Troponin I 0.07 (HH) <0.03 ng/mL    Comment: CRITICAL VALUE NOTED.  VALUE IS CONSISTENT WITH PREVIOUSLY REPORTED AND CALLED VALUE. Performed at [facility], [place] [address], [place], [place] [address]   Lactate dehydrogenase     Status: Abnormal   Collection Time: 01/11/19  8:43 PM  Result Value Ref Range   LDH 428 (H) 98 - 192 U/L    Comment: Performed at [facility], [place] [address], [place], [place] [address]  Protime-INR      Status: None   Collection Time: 01/11/19  8:43 PM  Result Value Ref Range   Prothrombin Time 15.1 11.4 - 15.2 seconds   INR 1.2 0.8 - 1.2    Comment: (NOTE) INR goal varies based on device and disease states. Performed at [facility], [place] [address] [place], [place] [address]    Dg Chest Portable 1 View  Result Date: 01/11/2019 CLINICAL DATA:  LEFT chest pain, hypoxia, history chronic kidney disease on dialysis EXAM: PORTABLE CHEST 1 VIEW COMPARISON:  Portable exam 1818 hours compared to 12/28/2018 FINDINGS: Enlargement of cardiac silhouette with pulmonary vascular congestion. Atherosclerotic calcification aorta. Scattered interstitial infiltrates likely pulmonary edema question CHF versus fluid overload. No gross pleural effusion or pneumothorax. Bones demineralized. IMPRESSION: Enlargement of cardiac silhouette with pulmonary vascular congestion and probable pulmonary edema. Electronically Signed   By: Lavonia Dana M.D.   On: 01/11/2019 18:54    Pending Labs Unresulted Labs (From admission, onward)    Start     Ordered   01/12/19 0500  Lipid panel  Tomorrow morning,   R  Comments:  Please obtain as a fasting lipid panel - should not have eaten/ drank food for 8 hours prior to labs.    01/11/19 2029   01/12/19 9485  Basic metabolic panel  Tomorrow morning,   R     01/11/19 2041   01/12/19 0500  CBC  Tomorrow morning,   R     01/11/19 2041   01/11/19 2031  Rapid urine drug screen (hospital performed)  Once,   R     01/11/19 2030   01/11/19 2029  Troponin I - Now Then Q6H  Now then every 6 hours,   R     01/11/19 2029   01/11/19 1755  Pathologist smear review  Once,   STAT     01/11/19 1755          Vitals/Pain Today's Vitals   01/11/19 1854 01/11/19 2130 01/11/19 2159 01/11/19 2219  BP:  (!) 162/86    Pulse:    (!) 105  Resp:  14  (!) 23  Temp:      SpO2:   91% 100%  PainSc: 10-Worst pain ever       Isolation Precautions No active  isolations  Medications Medications  nitroGLYCERIN (NITROSTAT) SL tablet 0.4 mg (has no administration in time range)  aspirin EC tablet 81 mg (81 mg Oral Given 01/11/19 2148)  oxyCODONE (Oxy IR/ROXICODONE) immediate release tablet 5 mg (has no administration in time range)  ipratropium (ATROVENT) nebulizer solution 0.5 mg (has no administration in time range)  levalbuterol (XOPENEX) nebulizer solution 0.63 mg (has no administration in time range)  dextromethorphan-guaiFENesin (MUCINEX DM) 30-600 MG per 12 hr tablet 1 tablet (has no administration in time range)  insulin aspart (novoLOG) injection 0-9 Units (has no administration in time range)  nicotine (NICODERM CQ - dosed in mg/24 hours) patch 21 mg (21 mg Transdermal Patch Applied 01/11/19 2150)  LORazepam (ATIVAN) tablet 1 mg (1 mg Oral Given 01/11/19 2203)    Or  LORazepam (ATIVAN) injection 1 mg ( Intravenous See Alternative 01/11/19 2203)  thiamine (VITAMIN B-1) tablet 100 mg (has no administration in time range)    Or  thiamine (B-1) injection 100 mg (has no administration in time range)  folic acid (FOLVITE) tablet 1 mg (has no administration in time range)  multivitamin with minerals tablet 1 tablet (has no administration in time range)  LORazepam (ATIVAN) injection 0-4 mg (has no administration in time range)    Followed by  LORazepam (ATIVAN) injection 0-4 mg (has no administration in time range)  heparin injection 5,000 Units (has no administration in time range)  ondansetron (ZOFRAN) tablet 4 mg (has no administration in time range)    Or  ondansetron (ZOFRAN) injection 4 mg (has no administration in time range)  hydrALAZINE (APRESOLINE) injection 5 mg (has no administration in time range)  LORazepam (ATIVAN) tablet 1 mg (has no administration in time range)  amLODipine (NORVASC) tablet 5 mg (has no administration in time range)  calcitRIOL (ROCALTROL) capsule 0.25 mcg (has no administration in time range)  insulin glargine  (LANTUS) injection 3 Units (has no administration in time range)  pantoprazole (PROTONIX) EC tablet 40 mg (has no administration in time range)  sevelamer carbonate (RENVELA) tablet 1,600 mg (has no administration in time range)  fluticasone (FLONASE) 50 MCG/ACT nasal spray 2 spray (has no administration in time range)  diphenhydrAMINE-zinc acetate (BENADRYL) 4-6.2 % cream 1 application (has no administration in time range)  Chlorhexidine Gluconate Cloth 2 % PADS 6  each (has no administration in time range)  fentaNYL (SUBLIMAZE) injection 100 mcg (100 mcg Intravenous Given 01/11/19 1801)  HYDROmorphone (DILAUDID) injection 1 mg (1 mg Intravenous Given 01/11/19 1854)    Mobility walks Low fall risk   Focused Assessments Renal Assessment Handoff:  Hemodialysis Schedule: Hemodialysis Schedule: Tuesday/Thursday/Saturday Last Hemodialysis date and time:    Restricted appendage: left arm     R Recommendations: See Admitting Provider Note  Report given to:   Additional Notes: Pt is dialysis pt and has not been to dialysis in past week

## 2019-01-11 NOTE — Consult Note (Addendum)
Renal Service Consult Note [facility]  ANJANETTE GILKEY 01/11/2019 [name] Requesting Physician:  Dr Regenia Skeeter  Reason for Consult:  ESRD pt w/ chest pain and cough HPI: The patient is a 63 y.o. 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  73% sat on RA and 100% on NRB.  Temp 98, CBG 126. CXR w/ vasc congestion and probable pulm edema.  Asked to see for ESRD.    Patient has had 15 hospital admissions in the last 10 months. Today her main c/o is L sided chest pain. EKG is negative for ischemic changes, trop is slightly elevated at 0.6.  CXR as above shows probable pulm edema.  Her last HD was on 4/25, she missed HD on 4/28, 4/30 and yesterday 5/2 due to diarrhea issues.  No abd pain or fevers.  She endorsed sig SOB as well. Chest pain has "been there since I started with this kidney shit last summer!"     ROS  no joint pain   no HA  no blurry vision  no rash  no diarrhea  no nausea/ vomiting  Past Medical History  Past Medical History:  Diagnosis Date  . Diabetes mellitus without complication ([place])   . ESRD (end stage renal disease) on dialysis ([place])    "TTS; [facility]; they're moving me to another one" (09/22/2018)  . Hepatitis C    diagnosed 09/2018  . Hypertension   . Oxygen deficiency    2 L at night  . Stroke ([place])   . Substance abuse ([place])    has been clean for 4 months    Past Surgical History  Past Surgical History:  Procedure Laterality Date  . AV FISTULA PLACEMENT Left 03/05/2018   Procedure: ARTERIOVENOUS (AV) FISTULA CREATION  LEFT UPPER EXTREMITY;  Surgeon: [name], MD;  Location: [place];  Service: Vascular;  Laterality: Left;  . AV FISTULA PLACEMENT Left 07/21/2018   Procedure: ARTERIOVENOUS (AV) FISTULA CREATION;  Surgeon: Marty Heck, MD;  Location: [place];  Service: Vascular;  Laterality: Left;  . AV FISTULA PLACEMENT Left 10/27/2018   Procedure:  INSERTION OF ARTERIOVENOUS (AV) GORE-TEX GRAFT UPPER ARM;  Surgeon: Marty Heck, MD;  Location: [place];  Service: Vascular;  Laterality: Left;  . BASCILIC VEIN TRANSPOSITION Left 09/29/2018   Procedure: BASILIC VEIN TRANSPOSITION SECOND STAGE LEFT UPPER ARM;  Surgeon: Marty Heck, MD;  Location: [place];  Service: Vascular;  Laterality: Left;  . HEMATOMA EVACUATION Left 03/06/2018   Procedure: EVACUATION HEMATOMA LEFT ARM;  Surgeon: Angelia Mould, MD;  Location: [place];  Service: Vascular;  Laterality: Left;  . I&D EXTREMITY Left 10/06/2018   Procedure: IRRIGATION AND DEBRIDEMENT ARM;  Surgeon: Marty Heck, MD;  Location: [place];  Service: Vascular;  Laterality: Left;  . INSERTION OF DIALYSIS CATHETER Left 04/29/2018   Procedure: INSERTION OF DIALYSIS CATHETER LEFT INTERNAL JUGULAR;  Surgeon: Angelia Mould, MD;  Location: [place];  Service: Vascular;  Laterality: Left;  . IR FLUORO GUIDE CV LINE RIGHT  02/18/2018  . IR FLUORO GUIDE CV LINE RIGHT  02/26/2018  . IR REMOVAL TUN CV CATH W/O FL  04/27/2018  . IR US GUIDE VASC ACCESS RIGHT  02/18/2018  . TEE WITHOUT CARDIOVERSION N/A 04/29/2018   Procedure: TRANSESOPHAGEAL ECHOCARDIOGRAM (TEE);  Surgeon: Sueanne Margarita, MD;  Location: [place];  Service: Cardiovascular;  Laterality: N/A;  .  THROMBECTOMY AND REVISION OF ARTERIOVENTOUS (AV) GORETEX  GRAFT Left 12/11/2018   Procedure: THROMBECTOMY AND  REVISION  ARTERIOVENTOUS GORETEX  GRAFT LEFT ARM;  Surgeon: [name], MD;  Location: [place];  Service: Vascular;  Laterality: Left;  Marland Kitchen VENOGRAM Left 10/27/2018   Procedure: LEFT CENTRAL VENOGRAM;  Surgeon: Marty Heck, MD;  Location: [facility];  Service: Vascular;  Laterality: Left;   Family History  Family History  Problem Relation Age of Onset  . Autoimmune disease Neg Hx    Social History  reports that she has been smoking cigarettes. She has a 24.00 pack-year smoking history. She has never used smokeless  tobacco. She reports previous alcohol use. She reports current drug use. Drugs: Cocaine and Heroin. Allergies  Allergies  Allergen Reactions  . Acetaminophen Nausea And Vomiting    Patient states Acetaminophen and Acetaminophen containing products make her nauseated. She demonstrated this 06/21/18 with nausea followed by emesis.  Marland Kitchen Hydroxyzine Itching   Home medications Prior to Admission medications   Medication Sig Start Date End Date Taking? Authorizing Provider  amLODipine (NORVASC) 5 MG tablet Take 1 tablet (5 mg total) by mouth at bedtime. 10/13/18   Alphonzo Grieve, MD  B Complex-C-Zn-Folic Acid (DIALYVITE 277-OEUM 15) 0.8 MG TABS Take 1 tablet by mouth daily. 11/04/18   [provider]  Blood Glucose Monitoring Suppl (ACCU-CHEK AVIVA) device Use as instructed 10/29/18 10/29/19  Isabelle Course, MD  calcitRIOL (ROCALTROL) 0.25 MCG capsule Take 1 capsule (0.25 mcg total) by mouth every Monday, Wednesday, and Friday with hemodialysis. Patient taking differently: Take 0.25 mcg by mouth every Tuesday, Thursday, and Saturday at 6 PM.  10/29/18   Isabelle Course, MD  diphenhydrAMINE (BENADRYL) 2 % cream Apply 1 application topically 3 (three) times daily as needed for itching.    [provider]  docusate sodium (COLACE) 100 MG capsule Take 1 capsule (100 mg total) by mouth daily for 30 days. 12/29/18 01/28/19  Thurnell Lose, MD  fluticasone (FLONASE) 50 MCG/ACT nasal spray Place 2 sprays into both nostrils daily.    [provider]  glucose blood (ACCU-CHEK AVIVA) test strip Use twice per day. 10/29/18   Isabelle Course, MD  insulin aspart (NOVOLOG) 100 UNIT/ML injection Inject 5 Units into the skin 3 (three) times daily with meals. Patient taking differently: Inject 0-9 Units into the skin 3 (three) times daily with meals.  10/13/18   Alphonzo Grieve, MD  insulin glargine (LANTUS) 100 UNIT/ML injection Inject 0.03 mLs (3 Units total) into the skin at bedtime. Patient taking  differently: Inject 5 Units into the skin at bedtime.  10/29/18   Isabelle Course, MD  Insulin Syringe-Needle U-100 (INSULIN SYRINGE .3CC/31GX5/16") 31G X 5/16" 0.3 ML MISC Inject 1 each into the skin 4 (four) times daily. 06/27/18   Clent Demark, PA-C  Lancets (ACCU-CHEK SOFT [facility]) lancets Use twice per day. 10/29/18   Isabelle Course, MD  pantoprazole (PROTONIX) 40 MG tablet Take 1 tablet (40 mg total) by mouth daily. 11/21/18   Ghimire, Henreitta Leber, MD  polyethylene glycol (MIRALAX / GLYCOLAX) 17 g packet Take 17 g by mouth daily. 12/29/18   Thurnell Lose, MD  sevelamer carbonate (RENVELA) 800 MG tablet Take 2 tablets (1,600 mg total) by mouth 3 (three) times daily with meals. 11/26/18   Bonnell Public, MD   Liver Function Tests No results for input(s): AST, ALT, ALKPHOS, BILITOT, PROT, ALBUMIN in the last 168 hours. No results  for input(s): LIPASE, AMYLASE in the last 168 hours. CBC Recent Labs  Lab 01/11/19 1755 01/11/19 1800  WBC 9.2  --   NEUTROABS 6.7  --   HGB 9.0* 10.5*  HCT 29.3* 31.0*  MCV 77.3*  --   PLT 310  --    Basic Metabolic Panel Recent Labs  Lab 01/11/19 1755 01/11/19 1800  NA 142 141  K 5.5* 5.4*  CL 103  --   CO2 18*  --   GLUCOSE 126*  --   BUN 116*  --   CREATININE 16.93*  --   CALCIUM 7.3*  --    Iron/TIBC/Ferritin/ %Sat    Component Value Date/Time   IRON 112 10/28/2018 1211   TIBC 231 (L) 10/28/2018 1211   FERRITIN 573 (H) 09/23/2018 2113   IRONPCTSAT 48 (H) 10/28/2018 1211    Vitals:   01/11/19 1839  BP: (!) 167/95  Pulse: (!) 105  Resp: (!) 23  Temp: 98.1 F (36.7 C)  SpO2: 100%   Exam Gen looks very restless, pursed lips expiration, clutching her left chest, SpO2 No rash, cyanosis or gangrene Sclera anicteric, throat clear +JVD Chest bibasilar rales, no wheezing RRR no MRG Abd soft ntnd no mass or ascites +bs GU  defer MS no joint effusions or deformity Ext trace LE edema, no wounds or ulcers Neuro is alert, Ox 3 ,  nonfocal    Home meds:  - amlodipine 5 hs  - insulin aspart 0-9 u tid ac ssi/ insulin glargine 5u qhs  - pantoprazole 40 qd  - sevelamer carbonate 2 ac tid    [facility]  4h  400/800  54kg  2/2.25 bath  Hep 3000  LUA AVG   - hectorol 5 ug  - venofer 100 mg thru ?  - mircera 100 mg every 2 wks , last ?     Assessment: 1. Dyspnea - likely this is vol overload and pulm edema by CXR. Spo2's are ok on nasal O2 but she is very restless and SOB appearing.    2. Chest pain - pleuritic, primary team will cover w/ IV heparin given concern for PE and then get testing after HD tonight. It is OK for her to get IV contrast.  3. ESRD on HD: missed last 3 sessions for recurrent diarrhea. Had L AVG declot w/ arterial revision on 12/11/18.  4. Diarrhea - chronic issue 5. HTN on norvasc 6. Drug abuse - not currently using drugs 7. Smoker - trying to quit, has nicotine patch on 8. Anemia ckd - Hb 9.8, follow 9. Hep C 10. H/o CVA 11. DM on insulin     Plan: 1. Would recommend acute bipap and hemodialysis in SDU bed setting. Calling in the HD team. We will dialyze her in her room in case of decompensation.       [facility] 01/11/2019, 9:46 PM

## 2019-01-11 NOTE — ED Notes (Signed)
IV team at bedside 

## 2019-01-11 NOTE — ED Triage Notes (Signed)
Pt arrives via ems from [facility] with complaints of cp onset this afternoon. Pt with CKD on dialysis. Last treatment was a week ago. Pt also complains of cough X3 days. Afebrile with ems. 170/110, HR 110, RR 32, 73% RA, 100% NRB, 98.1, CBG 126. 324mg  asa and 2 SL nitroglycerin given en route. Pt unable to sit still on ems stretcher. Pt appears anxious.

## 2019-01-11 NOTE — Progress Notes (Signed)
ANTICOAGULATION CONSULT NOTE - Initial Consult  Pharmacy Consult for heparin Indication: pulmonary embolus  Allergies  Allergen Reactions  . Acetaminophen Nausea And Vomiting    Patient states Acetaminophen and Acetaminophen containing products make her nauseated. She demonstrated this 06/21/18 with nausea followed by emesis.  Marland Kitchen Hydroxyzine Itching    Patient Measurements:   Heparin Dosing Weight: 55kg  Vital Signs: Temp: 98.1 F (36.7 C) (05/03 1839) BP: 162/86 (05/03 2130) Pulse Rate: 105 (05/03 2219)  Labs: Recent Labs    01/11/19 1755 01/11/19 1800 01/11/19 2043  HGB 9.0* 10.5*  --   HCT 29.3* 31.0*  --   PLT 310  --   --   LABPROT  --   --  15.1  INR  --   --  1.2  CREATININE 16.93*  --   --   TROPONINI 0.06*  --  0.07*    Estimated Creatinine Clearance: 2.5 mL/min (A) (by C-G formula based on SCr of 16.93 mg/dL (H)).   Medical History: Past Medical History:  Diagnosis Date  . Diabetes mellitus without complication ([place])   . ESRD (end stage renal disease) on dialysis ([place])    "TTS; [facility]; they're moving me to another one" (09/22/2018)  . Hepatitis C    diagnosed 09/2018  . Hypertension   . Oxygen deficiency    2 L at night  . Stroke ([place])   . Substance abuse ([place])    has been clean for 4 months    Assessment: 69 YOF presenting with CP, suspicious for PE, no anticoagulation PTA, H/H low/stable, plts 310.    Goal of Therapy:  Heparin level 0.3-0.7 units/ml Monitor platelets by anticoagulation protocol: Yes   Plan:  Heparin 3500 units IV x1, and gtt at 950 units/hr F/u 8 hour heparin level  Bertis Ruddy, PharmD Clinical Pharmacist Please check AMION for all [place] numbers 01/11/2019 10:57 PM

## 2019-01-12 ENCOUNTER — Inpatient Hospital Stay (HOSPITAL_COMMUNITY): Payer: Medicare Other

## 2019-01-12 ENCOUNTER — Observation Stay (HOSPITAL_COMMUNITY): Payer: Medicare Other

## 2019-01-12 ENCOUNTER — Encounter (HOSPITAL_COMMUNITY): Payer: Self-pay | Admitting: Radiology

## 2019-01-12 ENCOUNTER — Observation Stay (HOSPITAL_BASED_OUTPATIENT_CLINIC_OR_DEPARTMENT_OTHER): Payer: Medicare Other

## 2019-01-12 DIAGNOSIS — I5023 Acute on chronic systolic (congestive) heart failure: Secondary | ICD-10-CM | POA: Diagnosis not present

## 2019-01-12 DIAGNOSIS — I132 Hypertensive heart and chronic kidney disease with heart failure and with stage 5 chronic kidney disease, or end stage renal disease: Secondary | ICD-10-CM | POA: Diagnosis present

## 2019-01-12 DIAGNOSIS — I361 Nonrheumatic tricuspid (valve) insufficiency: Secondary | ICD-10-CM | POA: Diagnosis not present

## 2019-01-12 DIAGNOSIS — N185 Chronic kidney disease, stage 5: Secondary | ICD-10-CM | POA: Diagnosis not present

## 2019-01-12 DIAGNOSIS — I340 Nonrheumatic mitral (valve) insufficiency: Secondary | ICD-10-CM | POA: Diagnosis not present

## 2019-01-12 DIAGNOSIS — E11649 Type 2 diabetes mellitus with hypoglycemia without coma: Secondary | ICD-10-CM | POA: Diagnosis not present

## 2019-01-12 DIAGNOSIS — J9601 Acute respiratory failure with hypoxia: Secondary | ICD-10-CM | POA: Diagnosis present

## 2019-01-12 DIAGNOSIS — J810 Acute pulmonary edema: Secondary | ICD-10-CM | POA: Diagnosis present

## 2019-01-12 DIAGNOSIS — T82868A Thrombosis of vascular prosthetic devices, implants and grafts, initial encounter: Secondary | ICD-10-CM | POA: Diagnosis not present

## 2019-01-12 DIAGNOSIS — G92 Toxic encephalopathy: Secondary | ICD-10-CM | POA: Diagnosis present

## 2019-01-12 DIAGNOSIS — R079 Chest pain, unspecified: Secondary | ICD-10-CM | POA: Diagnosis present

## 2019-01-12 DIAGNOSIS — K2970 Gastritis, unspecified, without bleeding: Secondary | ICD-10-CM | POA: Diagnosis present

## 2019-01-12 DIAGNOSIS — E119 Type 2 diabetes mellitus without complications: Secondary | ICD-10-CM

## 2019-01-12 DIAGNOSIS — Z992 Dependence on renal dialysis: Secondary | ICD-10-CM | POA: Diagnosis not present

## 2019-01-12 DIAGNOSIS — K210 Gastro-esophageal reflux disease with esophagitis: Secondary | ICD-10-CM | POA: Diagnosis present

## 2019-01-12 DIAGNOSIS — F1410 Cocaine abuse, uncomplicated: Secondary | ICD-10-CM | POA: Diagnosis present

## 2019-01-12 DIAGNOSIS — G9341 Metabolic encephalopathy: Secondary | ICD-10-CM

## 2019-01-12 DIAGNOSIS — E875 Hyperkalemia: Secondary | ICD-10-CM | POA: Diagnosis present

## 2019-01-12 DIAGNOSIS — Z794 Long term (current) use of insulin: Secondary | ICD-10-CM

## 2019-01-12 DIAGNOSIS — F1110 Opioid abuse, uncomplicated: Secondary | ICD-10-CM | POA: Diagnosis present

## 2019-01-12 DIAGNOSIS — D631 Anemia in chronic kidney disease: Secondary | ICD-10-CM | POA: Diagnosis present

## 2019-01-12 DIAGNOSIS — Z86711 Personal history of pulmonary embolism: Secondary | ICD-10-CM | POA: Diagnosis not present

## 2019-01-12 DIAGNOSIS — Y832 Surgical operation with anastomosis, bypass or graft as the cause of abnormal reaction of the patient, or of later complication, without mention of misadventure at the time of the procedure: Secondary | ICD-10-CM | POA: Diagnosis present

## 2019-01-12 DIAGNOSIS — F17210 Nicotine dependence, cigarettes, uncomplicated: Secondary | ICD-10-CM | POA: Diagnosis present

## 2019-01-12 DIAGNOSIS — Z8673 Personal history of transient ischemic attack (TIA), and cerebral infarction without residual deficits: Secondary | ICD-10-CM | POA: Diagnosis not present

## 2019-01-12 DIAGNOSIS — F1010 Alcohol abuse, uncomplicated: Secondary | ICD-10-CM | POA: Diagnosis present

## 2019-01-12 DIAGNOSIS — R7989 Other specified abnormal findings of blood chemistry: Secondary | ICD-10-CM | POA: Diagnosis not present

## 2019-01-12 DIAGNOSIS — N186 End stage renal disease: Secondary | ICD-10-CM | POA: Diagnosis present

## 2019-01-12 DIAGNOSIS — E1122 Type 2 diabetes mellitus with diabetic chronic kidney disease: Secondary | ICD-10-CM | POA: Diagnosis present

## 2019-01-12 DIAGNOSIS — I5043 Acute on chronic combined systolic (congestive) and diastolic (congestive) heart failure: Secondary | ICD-10-CM | POA: Diagnosis present

## 2019-01-12 DIAGNOSIS — Z7982 Long term (current) use of aspirin: Secondary | ICD-10-CM | POA: Diagnosis not present

## 2019-01-12 DIAGNOSIS — B1920 Unspecified viral hepatitis C without hepatic coma: Secondary | ICD-10-CM | POA: Diagnosis present

## 2019-01-12 DIAGNOSIS — Z20828 Contact with and (suspected) exposure to other viral communicable diseases: Secondary | ICD-10-CM | POA: Diagnosis present

## 2019-01-12 DIAGNOSIS — Z9115 Patient's noncompliance with renal dialysis: Secondary | ICD-10-CM | POA: Diagnosis not present

## 2019-01-12 LAB — BLOOD GAS, ARTERIAL
Acid-Base Excess: 2.7 mmol/L — ABNORMAL HIGH (ref 0.0–2.0)
Acid-Base Excess: 1.9 mmol/L (ref 0.0–2.0)
Bicarbonate: 26.5 mmol/L (ref 20.0–28.0)
Bicarbonate: 25 mmol/L (ref 20.0–28.0)
Delivery systems: POSITIVE
Drawn by: 511851
Drawn by: 511851
Expiratory PAP: 4
FIO2: 30
Inspiratory PAP: 10
O2 Content: 3 L/min
O2 Saturation: 96.8 %
O2 Saturation: 97.5 %
Patient temperature: 98.6
Patient temperature: 98.6
pCO2 arterial: 39.5 mmHg (ref 32.0–48.0)
pCO2 arterial: 33.1 mmHg (ref 32.0–48.0)
pH, Arterial: 7.443 (ref 7.350–7.450)
pH, Arterial: 7.491 — ABNORMAL HIGH (ref 7.350–7.450)
pO2, Arterial: 87.3 mmHg (ref 83.0–108.0)
pO2, Arterial: 86.6 mmHg (ref 83.0–108.0)

## 2019-01-12 LAB — LIPID PANEL
Cholesterol: 140 mg/dL (ref 0–200)
HDL: 54 mg/dL (ref >40)
LDL Cholesterol: 77 mg/dL (ref 0–99)
Total CHOL/HDL Ratio: 2.6 RATIO
Triglycerides: 46 mg/dL (ref <150)
VLDL: 9 mg/dL (ref 0–40)

## 2019-01-12 LAB — CBC
HCT: 29.2 % — ABNORMAL LOW (ref 36.0–46.0)
Hemoglobin: 9.2 g/dL — ABNORMAL LOW (ref 12.0–15.0)
MCH: 23.8 pg — ABNORMAL LOW (ref 26.0–34.0)
MCHC: 31.5 g/dL (ref 30.0–36.0)
MCV: 75.6 fL — ABNORMAL LOW (ref 80.0–100.0)
Platelets: 274 10*3/uL (ref 150–400)
RBC: 3.86 MIL/uL — ABNORMAL LOW (ref 3.87–5.11)
RDW: 22.5 % — ABNORMAL HIGH (ref 11.5–15.5)
WBC: 9 10*3/uL (ref 4.0–10.5)
nRBC: 0 % (ref 0.0–0.2)

## 2019-01-12 LAB — BASIC METABOLIC PANEL
Anion gap: 15 (ref 5–15)
BUN: 51 mg/dL — ABNORMAL HIGH (ref 8–23)
CO2: 23 mmol/L (ref 22–32)
Calcium: 7.9 mg/dL — ABNORMAL LOW (ref 8.9–10.3)
Chloride: 101 mmol/L (ref 98–111)
Creatinine, Ser: 10.01 mg/dL — ABNORMAL HIGH (ref 0.44–1.00)
GFR calc Af Amer: 4 mL/min — ABNORMAL LOW (ref >60)
GFR calc non Af Amer: 4 mL/min — ABNORMAL LOW (ref >60)
Glucose, Bld: 90 mg/dL (ref 70–99)
Potassium: 4.5 mmol/L (ref 3.5–5.1)
Sodium: 139 mmol/L (ref 135–145)

## 2019-01-12 LAB — GLUCOSE, CAPILLARY
Glucose-Capillary: 78 mg/dL (ref 70–99)
Glucose-Capillary: 95 mg/dL (ref 70–99)
Glucose-Capillary: 88 mg/dL (ref 70–99)
Glucose-Capillary: 83 mg/dL (ref 70–99)
Glucose-Capillary: 85 mg/dL (ref 70–99)
Glucose-Capillary: 89 mg/dL (ref 70–99)

## 2019-01-12 LAB — TROPONIN I
Troponin I: 0.07 ng/mL (ref <0.03)
Troponin I: 0.05 ng/mL (ref <0.03)

## 2019-01-12 LAB — PATHOLOGIST SMEAR REVIEW

## 2019-01-12 LAB — HEPARIN LEVEL (UNFRACTIONATED)
Heparin Unfractionated: 0.39 IU/mL (ref 0.30–0.70)
Heparin Unfractionated: <0.1 IU/mL — ABNORMAL LOW (ref 0.30–0.70)

## 2019-01-12 LAB — ETHANOL: Alcohol, Ethyl (B): <10 mg/dL (ref <10)

## 2019-01-12 LAB — ECHOCARDIOGRAM COMPLETE: Weight: 2024.7 oz

## 2019-01-12 MED ORDER — IOHEXOL 350 MG/ML SOLN
50.0000 mL | Freq: Once | INTRAVENOUS | Status: AC | PRN
  Administered 2019-01-12: 07:00:00 50 mL via INTRAVENOUS

## 2019-01-12 MED ORDER — CLONIDINE HCL 0.1 MG/24HR TD PTWK
0.1000 mg | MEDICATED_PATCH | TRANSDERMAL | Status: DC
  Administered 2019-01-12 – 2019-01-19 (×2): 0.1 mg via TRANSDERMAL
  Filled 2019-01-12 (×3): qty 1

## 2019-01-12 MED ORDER — GABAPENTIN 100 MG PO CAPS
100.0000 mg | ORAL_CAPSULE | Freq: Three times a day (TID) | ORAL | Status: DC
  Administered 2019-01-12 – 2019-01-20 (×20): 100 mg via ORAL
  Filled 2019-01-12 (×21): qty 1

## 2019-01-12 MED ORDER — MORPHINE SULFATE (PF) 2 MG/ML IV SOLN
1.0000 mg | INTRAVENOUS | Status: DC | PRN
  Administered 2019-01-12: 11:00:00 1 mg via INTRAVENOUS
  Filled 2019-01-12: qty 1

## 2019-01-12 MED ORDER — LABETALOL HCL 5 MG/ML IV SOLN
5.0000 mg | Freq: Three times a day (TID) | INTRAVENOUS | Status: AC
  Administered 2019-01-12 – 2019-01-13 (×3): 5 mg via INTRAVENOUS
  Filled 2019-01-12 (×3): qty 4

## 2019-01-12 MED ORDER — NALOXONE HCL 0.4 MG/ML IJ SOLN: 0.4000 mg | INTRAMUSCULAR | Status: DC

## 2019-01-12 MED ORDER — IPRATROPIUM BROMIDE 0.02 % IN SOLN: 0.5000 mg | Freq: Four times a day (QID) | RESPIRATORY_TRACT | Status: DC | PRN

## 2019-01-12 MED ORDER — MORPHINE SULFATE (PF) 2 MG/ML IV SOLN
2.0000 mg | Freq: Once | INTRAVENOUS | Status: AC
  Administered 2019-01-12: 23:00:00 2 mg via INTRAVENOUS
  Filled 2019-01-12: qty 1

## 2019-01-12 MED ORDER — SENNOSIDES-DOCUSATE SODIUM 8.6-50 MG PO TABS
1.0000 | ORAL_TABLET | Freq: Two times a day (BID) | ORAL | Status: DC
  Administered 2019-01-13 – 2019-01-20 (×13): 1 via ORAL
  Filled 2019-01-12 (×15): qty 1

## 2019-01-12 NOTE — Progress Notes (Signed)
Pt was bagging for food and tried to get out of bed. This nurse put patient on the bed then pt bit on this nurse arm. Explained patient to calm down and more awake other wise couldn't give food yet. Pt's still begging for the food. HS [employer]

## 2019-01-12 NOTE — Progress Notes (Signed)
Patient back from CT. No issues

## 2019-01-12 NOTE — Progress Notes (Signed)
Patient was agitated because she was hungry & pain medications. Most of time patient continuously keep asking food since no pain medications & any sedation. One to one sitting was at the bedside. At times, patient was resting for 5-10 min then woke up again. Patient insisted to use bathroom, but patient was not fully awake and went back to laying on the bed. It's not safe for patient. Explained patient why she use the bedpan. HS [employer]

## 2019-01-12 NOTE — Progress Notes (Signed)
Follow up CXR no better after HD -3L last night, looks worse possibly, and similar to BOOP pattern from last summer. CT from last night w/ diffuse ground glass.  Pt weaned off bipap this am but still agitated per [facility] MD.  Would have CCM see patient, will call them.  May need intubation.   [facility] 01/12/2019, 1:43 PM

## 2019-01-12 NOTE — Progress Notes (Signed)
Pharmacy is unable to confirm the medications the patient was taking at home. All options have been exhausted and a resolution to the situation is not expected.   Where possible, their outpatient pharmacy(s) have been contacted for the last time prescriptions were filled and that information has been added to each medication in an Order Note (highlighted yellow below the medication).  Please contact pharmacy if further assistance is needed.   Romeo Rabon, PharmD. Mobile: [phone]. 01/12/2019,4:23 PM.

## 2019-01-12 NOTE — Consult Note (Signed)
NAME:  Rhonda Lynch, MRN:  [id number], DOB:  1955/10/05, LOS: 0 ADMISSION DATE:  01/11/2019, CONSULTATION DATE:  01/12/19 REFERRING MD:  Erlinda Hong  CHIEF COMPLAINT:  AMS   Brief History   Rhonda Lynch is a 63 y.o. female who was admitted 5/3 with SOB and chest pain felt to be due to acute pulmonary edema in the setting of dialysis non-compliance. PM 5/4, was somonlent / obtunded; therefore, PCCM asked to see in consultation.  Of note, she had received 4mg  ativan and 1mg  morphine earlier that morning.   During our evaluation, she began to come around and be conversant.  ABG was normal.  History of present illness   Pt is encephelopathic; therefore, this HPI is obtained from chart review. Rhonda Lynch is a 63 y.o. 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  She presented to [facility] ED 5/3 with SOB and chest pain that began that morning.  CXR demonstrated pulmonary edema.  She also had D-dimer of 9; therefore, had CTA chest to rule out PE.  This was negative.  She was admitted and was treated with emergent HD for acute hypoxic respiratory failure felt to be due to acute pulmonary edema / chronic sCHF in setting dialysis non-compliance.  COVID testing was performed and was negative.  In afternoon 5/4, she was found to be somnolent and obtunded.  PCCM was asked to see in consultation. ABG from earlier that morning while pt was on BiPAP was normal.  Per notes, she is apparently currently not using drugs; however, UDS in March this year was positive for cocaine.  Per nursing staff, pt was quite agitated earlier in the shift.  Per CIWA protocol, she was given 4mg  ativan as well as 1mg  morphine for chest pain.  During our evaluation, pt was first quite somnolent but did arouse to voice.   She was able to state that she is in the hospital.  STAT ABG was ordered and was normal.  While outside of the room, pt did begin to come around.  She is now conversant and is asking for pain meds.  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  Significant Hospital Events   5/3 > admit, emergent HD. 5/4 > PCCM consult.  Consults:  Nephrology. PCCM.  Procedures:  None.  Significant Diagnostic Tests:  CTA chest 5/4 > negative for PE.  Small left pleural effusion.  Improved aeration since 12/03/18. Echo 5/4 >    Micro Data:  na Antimicrobials:  None   Interim history/subjective:  Now more awake   Objective   Blood pressure (Abnormal) 179/106, pulse (Abnormal) 110, temperature 99 F (37.2 C), temperature source Axillary, resp. rate (Abnormal) 21, weight 57.4 kg, SpO2 94 %.    FiO2 (%):  [28 %-30 %] 30 %   Intake/Output Summary (Last 24 hours) at 01/12/2019 1412 Last data filed at 01/12/2019 1200 Gross per 24 hour  Intake 0 ml  Output 3500 ml  Net -3500 ml   Filed Weights   01/12/19 0000 01/12/19 0345  Weight: 60.9 kg 57.4 kg    Examination: General: chronically ill appearing 63 year old female HENT: NCAT no JVD  Lungs: diffuse rhonchi, no accessory use  Cardiovascular: RRR, tachy  Abdomen: soft  Extremities: warm and dry  Neuro: sp slurred. Oriented X 3. Moves all ext  GU: anuric   Resolved Hospital Problem list     Assessment & Plan:  Acute toxic and metabolic encephalopathy.  -etiology not entirely clear. Came in w/ chest pain but got progressively agitated. Was hypoxic on admission but currently 100% on 3 liters, over the following 12 hrs became more agitated. Got ativan scheduled, then 1 time morphine (so seems like iatrogenic polypharmacy probably the culprit). Now obtunded off  BIPAP Plan Stat ABG; if hypercarbic will try BIPAP prior to intubation Serum drug screen Ammonia level  Consider CT head. Might need airway to do this  Dc all sedating meds Can give narcan now  Would consider precedex IF agitated but seems as though she is over sedated currently.   Acute hypoxic respiratory failure in setting of diffuse pulm infiltrates. Hypoxia  improved after HD but CXR still w/ Bilateral airspace disease R>L -favor on-going volume overload but consider aspiration  Plan NPO Titrate o2 Stat ABG If hypercarbic place BIPAP  Chest pain Plan Per primary   ESRD Plan Per nephro  HTN Plan Cont PRN rx   Anemia of chronic disease Plan Trend cbc  DM  Plan ssi     Best practice:  Diet: NPO Pain/Anxiety/Delirium protocol (if indicated): stopped all sedating meds 5/4 VAP protocol (if indicated): na DVT prophylaxis: IV heparin GI prophylaxis: NA Glucose control: ssi Mobility: na Code Status: full code  Family Communication: pending  Disposition: needs close obs. High risk for needing intubation. Hope stopping sedating meds should help.   Labs   CBC: Recent Labs  Lab 01/11/19 1755 01/11/19 1800 01/12/19 0828  WBC 9.2  --  9.0  NEUTROABS 6.7  --   --   HGB 9.0* 10.5* 9.2*  HCT 29.3* 31.0* 29.2*  MCV 77.3*  --  75.6*  PLT 310  --  160    Basic Metabolic Panel: Recent Labs  Lab 01/11/19 1755 01/11/19 1800 01/12/19 0828  NA 142 141 139  K 5.5* 5.4* 4.5  CL 103  --  101  CO2 18*  --  23  GLUCOSE 126*  --  90  BUN 116*  --  51*  CREATININE 16.93*  --  10.01*  CALCIUM 7.3*  --  7.9*   GFR: Estimated Creatinine Clearance: 4.6 mL/min (A) (by C-G formula based on SCr of 10.01 mg/dL (H)). Recent Labs  Lab 01/11/19 1755 01/12/19 0828  WBC 9.2 9.0    Liver Function Tests: No results for input(s): AST, ALT, ALKPHOS, BILITOT, PROT, ALBUMIN in the last 168 hours. No results for input(s): LIPASE, AMYLASE in the last 168 hours. No results  for input(s): AMMONIA in the last 168 hours.  ABG    Component Value Date/Time   PHART 7.443 01/12/2019 0930   PCO2ART 39.5 01/12/2019 0930   PO2ART 87.3 01/12/2019 0930   HCO3 26.5 01/12/2019 0930   TCO2 22 01/11/2019 1800   ACIDBASEDEF 4.0 (H) 01/11/2019 1800   O2SAT 96.8 01/12/2019 0930     Coagulation Profile: Recent Labs  Lab 01/11/19 2043  INR 1.2    Cardiac Enzymes: Recent Labs  Lab 01/11/19 1755 01/11/19 2043 01/12/19 0242 01/12/19 0828  TROPONINI 0.06* 0.07* 0.07* 0.05*    HbA1C: Hgb A1c MFr Bld  Date/Time Value Ref Range Status  12/29/2018 04:20 AM 4.8 4.8 - 5.6 % Final  Comment:    (NOTE)         Prediabetes: 5.7 - 6.4         Diabetes: >6.4         Glycemic control for adults with diabetes: <7.0   12/19/2018 12:31 AM 4.6 (L) 4.8 - 5.6 % Final    Comment:    (NOTE) Pre diabetes:          5.7%-6.4% Diabetes:              >6.4% Glycemic control for   <7.0% adults with diabetes     CBG: Recent Labs  Lab 01/12/19 0621 01/12/19 0926 01/12/19 1200  GLUCAP 78 95 88    Review of Systems:     Past Medical History  She,  has a past medical history of Diabetes mellitus without complication ([place]), ESRD (end stage renal disease) on dialysis ([place]), Hepatitis C, Hypertension, Oxygen deficiency, Stroke ([place]), and Substance abuse ([place]).   Surgical History    Past Surgical History:  Procedure Laterality Date  . AV FISTULA PLACEMENT Left 03/05/2018   Procedure: ARTERIOVENOUS (AV) FISTULA CREATION  LEFT UPPER EXTREMITY;  Surgeon: [name], MD;  Location: [place];  Service: Vascular;  Laterality: Left;  . AV FISTULA PLACEMENT Left 07/21/2018   Procedure: ARTERIOVENOUS (AV) FISTULA CREATION;  Surgeon: Marty Heck, MD;  Location: [place];  Service: Vascular;  Laterality: Left;  . AV FISTULA PLACEMENT Left 10/27/2018   Procedure: INSERTION OF ARTERIOVENOUS (AV) GORE-TEX GRAFT UPPER ARM;  Surgeon: Marty Heck, MD;  Location: [place];   Service: Vascular;  Laterality: Left;  . BASCILIC VEIN TRANSPOSITION Left 09/29/2018   Procedure: BASILIC VEIN TRANSPOSITION SECOND STAGE LEFT UPPER ARM;  Surgeon: Marty Heck, MD;  Location: [place];  Service: Vascular;  Laterality: Left;  . HEMATOMA EVACUATION Left 03/06/2018   Procedure: EVACUATION HEMATOMA LEFT ARM;  Surgeon: Angelia Mould, MD;  Location: [place];  Service: Vascular;  Laterality: Left;  . I&D EXTREMITY Left 10/06/2018   Procedure: IRRIGATION AND DEBRIDEMENT ARM;  Surgeon: Marty Heck, MD;  Location: [place];  Service: Vascular;  Laterality: Left;  . INSERTION OF DIALYSIS CATHETER Left 04/29/2018   Procedure: INSERTION OF DIALYSIS CATHETER LEFT INTERNAL JUGULAR;  Surgeon: Angelia Mould, MD;  Location: [place];  Service: Vascular;  Laterality: Left;  . IR FLUORO GUIDE CV LINE RIGHT  02/18/2018  . IR FLUORO GUIDE CV LINE RIGHT  02/26/2018  . IR REMOVAL TUN CV CATH W/O FL  04/27/2018  . IR US GUIDE VASC ACCESS RIGHT  02/18/2018  . TEE WITHOUT CARDIOVERSION N/A 04/29/2018   Procedure: TRANSESOPHAGEAL ECHOCARDIOGRAM (TEE);  Surgeon: Sueanne Margarita, MD;  Location: [facility];  Service: Cardiovascular;  Laterality: N/A;  . THROMBECTOMY AND REVISION OF ARTERIOVENTOUS (AV) GORETEX  GRAFT Left 12/11/2018   Procedure: THROMBECTOMY AND  REVISION  ARTERIOVENTOUS GORETEX  GRAFT LEFT ARM;  Surgeon: [name], MD;  Location: [place];  Service: Vascular;  Laterality: Left;  Marland Kitchen VENOGRAM Left 10/27/2018   Procedure: LEFT CENTRAL VENOGRAM;  Surgeon: Marty Heck, MD;  Location: [place];  Service: Vascular;  Laterality: Left;     Social History   reports that she has been smoking cigarettes. She has a 24.00 pack-year smoking history. She has never used smokeless tobacco. She reports previous alcohol use. She reports current drug use. Drugs: Cocaine and Heroin.   Family History   Her family history is negative for Autoimmune disease.  Allergies Allergies   Allergen Reactions  . Acetaminophen Nausea And Vomiting    Patient states Acetaminophen and Acetaminophen containing products make her nauseated. She demonstrated this 06/21/18 with nausea followed by emesis.  Marland Kitchen Hydroxyzine Itching     Home Medications  Prior to Admission medications   Medication Sig Start Date End Date Taking? Authorizing Provider  amLODipine (NORVASC) 5 MG tablet Take 1 tablet (5 mg total) by mouth at bedtime. 10/13/18   Alphonzo Grieve, MD  B Complex-C-Zn-Folic Acid (DIALYVITE 336-PQAE 15) 0.8 MG TABS Take 1 tablet by mouth daily. 11/04/18   [provider]  Blood Glucose Monitoring Suppl (ACCU-CHEK AVIVA) device Use as instructed 10/29/18 10/29/19  Isabelle Course, MD  calcitRIOL (ROCALTROL) 0.25 MCG capsule Take 1 capsule (0.25 mcg total) by mouth every Monday, Wednesday, and Friday with hemodialysis. Patient taking differently: Take 0.25 mcg by mouth every Tuesday, Thursday, and Saturday at 6 PM.  10/29/18   Isabelle Course, MD  diphenhydrAMINE (BENADRYL) 2 % cream Apply 1 application topically 3 (three) times daily as needed for itching.    [provider]  docusate sodium (COLACE) 100 MG capsule Take 1 capsule (100 mg total) by mouth daily for 30 days. 12/29/18 01/28/19  Thurnell Lose, MD  fluticasone (FLONASE) 50 MCG/ACT nasal spray Place 2 sprays into both nostrils daily.    [provider]  glucose blood (ACCU-CHEK AVIVA) test strip Use twice per day. 10/29/18   Isabelle Course, MD  insulin aspart (NOVOLOG) 100 UNIT/ML injection Inject 5 Units into the skin 3 (three) times daily with meals. Patient taking differently: Inject 0-9 Units into the skin 3 (three) times daily with meals.  10/13/18   Alphonzo Grieve, MD  insulin glargine (LANTUS) 100 UNIT/ML injection Inject 0.03 mLs (3 Units total) into the skin at bedtime. Patient taking differently: Inject 5 Units into the skin at bedtime.  10/29/18   Isabelle Course, MD  Insulin Syringe-Needle U-100  (INSULIN SYRINGE .3CC/31GX5/16") 31G X 5/16" 0.3 ML MISC Inject 1 each into the skin 4 (four) times daily. 06/27/18   Clent Demark, PA-C  Lancets (ACCU-CHEK SOFT [facility]) lancets Use twice per day. 10/29/18   Isabelle Course, MD  lidocaine-prilocaine (EMLA) cream Apply 1 application topically See admin instructions. Apply small amount to access site (AVF)before dialysis. Cover with occlusive dressing (Saran wrap) 12/29/18   [provider]  pantoprazole (PROTONIX) 40 MG tablet Take 1 tablet (40 mg total) by mouth daily. 11/21/18   Ghimire, Henreitta Leber, MD  polyethylene glycol (MIRALAX / GLYCOLAX) 17 g packet Take 17 g by mouth daily. 12/29/18   Thurnell Lose, MD  sevelamer carbonate (RENVELA) 800 MG tablet Take 2 tablets (1,600 mg total) by mouth 3 (three) times daily with meals. 11/26/18   Bonnell Public, MD     Critical care time: 32 min     Erick Colace ACNP-BC [facility] Pager # [phone] OR # [phone] if no answer

## 2019-01-12 NOTE — Progress Notes (Signed)
Lower extremity venous has been completed.   Preliminary results in CV Proc.   [name] 01/12/2019 9:43 AM

## 2019-01-12 NOTE — Progress Notes (Signed)
RT Note:  Visited patient in room. Patient on 2L O2 and sat was 100%.  Patient sounded a little Rhonci, patient was desiring to eat and wanted something for pain.  Alerted RN that patient was requesting these items and attempting to get out of bed.  NT was at bedside.

## 2019-01-12 NOTE — Progress Notes (Signed)
ANTICOAGULATION CONSULT NOTE - Pharmacy Consult for heparin Indication: pulmonary embolus/DVT  Allergies  Allergen Reactions  . Acetaminophen Nausea And Vomiting    Patient states Acetaminophen and Acetaminophen containing products make her nauseated. She demonstrated this 06/21/18 with nausea followed by emesis.  Marland Kitchen Hydroxyzine Itching    Patient Measurements: Weight: 126 lb 8.7 oz (57.4 kg) Heparin Dosing Weight: 55kg  Vital Signs: Temp: 98.6 F (37 C) (05/04 0728) Temp Source: Axillary (05/04 0728) BP: 181/93 (05/04 0728) Pulse Rate: 111 (05/04 0728)  Labs: Recent Labs    01/11/19 1755 01/11/19 1800 01/11/19 2043 01/12/19 0242 01/12/19 0703  HGB 9.0* 10.5*  --   --   --   HCT 29.3* 31.0*  --   --   --   PLT 310  --   --   --   --   LABPROT  --   --  15.1  --   --   INR  --   --  1.2  --   --   HEPARINUNFRC  --   --   --   --  0.39  CREATININE 16.93*  --   --   --   --   TROPONINI 0.06*  --  0.07* 0.07*  --     Estimated Creatinine Clearance: 2.7 mL/min (A) (by C-G formula based on SCr of 16.93 mg/dL (H)).   Medical History: Past Medical History:  Diagnosis Date  . Diabetes mellitus without complication ([place])   . ESRD (end stage renal disease) on dialysis ([place])    "TTS; [facility]; they're moving me to another one" (09/22/2018)  . Hepatitis C    diagnosed 09/2018  . Hypertension   . Oxygen deficiency    2 L at night  . Stroke ([place])   . Substance abuse ([place])    has been clean for 4 months    Assessment: 67 YOF presenting with CP and possible DVT (CT negative for PE) no anticoagulation PTA -heparin level= 0.39, CBC stable   Goal of Therapy:  Heparin level 0.3-0.7 units/ml Monitor platelets by anticoagulation protocol: Yes   Plan:  -No heparin changes needed -Heparin level in 6 hours and daily wth CBC daily  Hildred Laser, PharmD Clinical Pharmacist **Pharmacist phone directory can now be found on [email or web address] ([email or web address]).  Listed under [place].

## 2019-01-12 NOTE — Progress Notes (Addendum)
Pt was calling for food then she set up right on the side of bed, almost her upper body hanging on the side rails. Encourage patient to relax. then patient called for chest pain. Patient hold her chest area. Administered NTG, but patient kept asking for pain medications. CIWA scale high and administered ativan 4 mg IVP as following the order. Patient was calm down. Then she was agitated and c/o pain. Pt didn't answer the questions, at times mumbling sounds. She didn't keep the Bi-pap machine and SBP 180's administered hydralazine 5 mg IVP. Called rapid response nurse due to worrying about keep the airway and can't give ativan repeat, because pt's dialysis patient. Rapid response nurse checked patient and talked Dr. Erlinda Hong. Do ABG then weaning off Bi-pap. Nephrologist told Dr. Erlinda Hong regarding CIWA scale, patient is not drinker. Pt has history of cocaine use. Will ask Dr. Erlinda Hong to check cocaine level and if positive put on the COWS scale. HS [employer]

## 2019-01-12 NOTE — Progress Notes (Signed)
Patient transported to CT scan via BIPAP

## 2019-01-12 NOTE — Progress Notes (Signed)
PROGRESS NOTE  Rhonda Lynch BSW:[id number] DOB: [birth year] DOA: 01/11/2019 PCP: Clent Demark, PA-C  HPI/Recap of past 24 hours:  She  Received multiple doses of ativan, she is confused and agitated, maintaining her airway on 2liter o2 supplement patient reports chest pain, her blood pressure is elevated,   Assessment/Plan: Principal Problem:   Acute pulmonary edema (HCC) Active Problems:   TOBACCO ABUSE   Alcohol abuse   Cocaine abuse (HCC)   Elevated troponin   Hypertension   Acute respiratory failure with hypoxia (HCC)   Type II diabetes mellitus with renal manifestations (HCC)   Chest pain   End-stage renal disease on hemodialysis (HCC)   Hyperkalemia   Acute on chronic systolic CHF (congestive heart failure) (HCC)   Fluid overload  Chest pain -per ED triage intake "Pt arrives via ems from [facility] with complaints of cp onset this afternoon. Pt with CKD on dialysis. Last treatment was a week ago. Pt also complains of cough X3 days. Afebrile with ems. 170/110, HR 110, RR 32, 73% RA, 100% NRB, 98.1, CBG 126. 324mg  asa and 2 SL nitroglycerin given en route. Pt unable to sit still on ems stretcher. Pt appears anxious. 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 -per chart review, although there is a documentation of alcohol use, but alcohol level in the past tested was never elevated uds test showed she is always  positive for cocaine -encephalopathy from intoxication from cocaine? Hypoxia? Uremia? 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, nonreported currently hgb 9.2, mcv 75.6  Polysubstance abuse + cocaine    Code Status: full  Family Communication: patient   Disposition Plan: remain in stepdown   Consultants:  Nephrology  Critical care  Procedures:  bipap  Antibiotics:  none   Objective: BP (!) 195/99   Pulse (!) 108   Temp 98.6 F (37 C) (Axillary)   Resp 20   Wt 57.4 kg   SpO2 100%   BMI 23.91 kg/m   Intake/Output Summary (Last 24 hours) at 01/12/2019 1022 Last data filed at 01/12/2019 0345 Gross per 24 hour  Intake -  Output 3500 ml  Net -3500 ml   Filed Weights   01/12/19 0000 01/12/19 0345  Weight: 60.9 kg 57.4 kg    Exam: Patient is examined daily including today on 01/12/2019, exams remain the same as of yesterday except that has changed    General:  Confused, agitated at times, does not follow commands  Cardiovascular: RRR  Respiratory: crackles , no wheezing  Abdomen: Soft/ND/NT, positive BS  Musculoskeletal: trace Edema  Neuro: confused, agitated, moving all extremities   Data Reviewed: Basic Metabolic Panel: Recent Labs  Lab 01/11/19 1755 01/11/19 1800 01/12/19 0828  NA 142 141 139  K 5.5* 5.4* 4.5  CL  103  --  101  CO2 18*  --  23  GLUCOSE 126*  --  90  BUN 116*  --  51*  CREATININE 16.93*  --  10.01*  CALCIUM 7.3*  --  7.9*   Liver Function Tests: No results for input(s): AST, ALT, ALKPHOS, BILITOT, PROT, ALBUMIN in the last 168 hours. No results for input(s): LIPASE, AMYLASE in the last 168 hours. No results for input(s): AMMONIA in the last 168 hours.  CBC: Recent Labs  Lab 01/11/19 1755 01/11/19 1800 01/12/19 0828  WBC 9.2  --  9.0  NEUTROABS 6.7  --   --   HGB 9.0* 10.5* 9.2*  HCT 29.3* 31.0* 29.2*  MCV 77.3*  --  75.6*  PLT 310  --  274   Cardiac Enzymes:   Recent Labs  Lab 01/11/19 1755 01/11/19 2043 01/12/19 0242 01/12/19 0828  TROPONINI 0.06* 0.07* 0.07* 0.05*   BNP (last 3 results) Recent Labs    02/12/18 1534 06/20/18 2042 12/03/18 1209  BNP 225.7* >4,500.0* >4,500.0*    ProBNP (last 3 results) No results for input(s): PROBNP in the last 8760 hours.  CBG: Recent Labs  Lab 01/12/19 0621 01/12/19 0926  GLUCAP 78 95    Recent Results (from the past 240 hour(s))  SARS Coronavirus 2 (CEPHEID- Performed in [facility] hospital lab), Hosp Order     Status: None   Collection Time: 01/11/19  7:57 PM  Result Value Ref Range Status   SARS Coronavirus 2 NEGATIVE NEGATIVE Final    Comment: (NOTE) If result is NEGATIVE SARS-CoV-2 target nucleic acids are NOT DETECTED. The SARS-CoV-2 RNA is generally detectable in upper and lower  respiratory specimens during the acute phase of infection. The lowest  concentration of SARS-CoV-2 viral copies this assay can detect is 250  copies / mL. A negative result does not preclude SARS-CoV-2 infection  and should not be used as the sole basis for treatment or other  patient management decisions.  A negative result may occur with  improper specimen collection / handling, submission of specimen other  than nasopharyngeal swab, presence of viral mutation(s) within the  areas targeted by this assay, and inadequate number of viral copies  (<250 copies / mL). A negative result must be combined with clinical  observations, patient history, and epidemiological information. If result is POSITIVE SARS-CoV-2 target nucleic acids are DETECTED. The SARS-CoV-2 RNA is generally detectable in upper and lower  respiratory specimens dur ing the acute phase of infection.  Positive   results are indicative of active infection with SARS-CoV-2.  Clinical  correlation with patient history and other diagnostic information is  necessary to determine patient infection status.  Positive results do  not rule out bacterial infection or co-infection with other viruses. If result is PRESUMPTIVE POSTIVE SARS-CoV-2 nucleic acids MAY BE PRESENT.   A presumptive positive result was obtained on the submitted specimen  and confirmed on repeat testing.  While 2019 novel coronavirus  (SARS-CoV-2) nucleic acids may be present in the submitted sample  additional confirmatory testing may be necessary for epidemiological  and / or clinical management purposes  to differentiate between  SARS-CoV-2 and other Sarbecovirus currently known to infect humans.  If clinically indicated additional testing with an alternate test  methodology [phone]) is advised. The SARS-CoV-2 RNA is generally  detectable in upper and lower respiratory sp ecimens during the acute  phase of infection. The expected result is Negative. Fact Sheet for Patients:  StrictlyIdeas.no Fact Sheet for  Healthcare Providers: BankingDealers.co.za This test is not yet approved or cleared by the [place] and has been authorized for detection and/or diagnosis of SARS-CoV-2 by FDA under an Emergency Use Authorization (EUA).  This EUA will remain in effect (meaning this test can be used) for the duration of the COVID-19 declaration under Section 564(b)(1) of the Act, 21 U.S.C. section 360bbb-3(b)(1), unless the authorization is terminated or revoked sooner. Performed at [facility], [facility] [address], [place],  [address]      Studies: Ct Angio Chest Pe W Or Wo Contrast  Result Date: 01/12/2019 CLINICAL DATA:  Positive D-dimer. Intermediate probability for pulmonary embolism EXAM: CT ANGIOGRAPHY CHEST WITH CONTRAST TECHNIQUE: Multidetector CT imaging of the chest  was performed using the standard protocol during bolus administration of intravenous contrast. Multiplanar CT image reconstructions and MIPs were obtained to evaluate the vascular anatomy. CONTRAST:  16mL OMNIPAQUE IOHEXOL 350 MG/ML SOLN COMPARISON:  12/03/2018 FINDINGS: Cardiovascular: Cardiomegaly without pericardial effusion. Mild atherosclerotic calcification of the aorta. Satisfactory opacification of the pulmonary arteries. No typical acute pulmonary embolism is seen. There is a web in the left lower lobe pulmonary artery and irregular filling defect with under filling of downstream vessels and is segmental left lower lobe branch. These were seen on immediate prior. There is also blunting of anterior basal segment pulmonary arteries in the right lower lobe. Questionable posterior segment right upper lobe eccentric filling defect Mediastinum/Nodes: Negative for adenopathy or mass Lungs/Pleura: Small left pleural effusion with adjacent pulmonary opacification and volume loss. Airspace disease seen previously has significantly improved with mild generalized reticular appearance. There is mild centrilobular emphysema. Upper Abdomen: Negative Musculoskeletal: No acute finding.  Thoracic disc degeneration Review of the MIP images confirms the above findings. IMPRESSION: 1. Negative for acute pulmonary embolism. There is evidence of remote pulmonary emboli with web formation. 2. Improved aeration since 12/03/2018. 3. Small left pleural effusion. There is lower lobe atelectasis and/or scarring. Electronically Signed   By: Monte Fantasia M.D.   On: 01/12/2019 07:04   Dg Chest Portable 1 View  Result Date: 01/11/2019 CLINICAL DATA:  LEFT chest pain, hypoxia, history chronic kidney disease on dialysis EXAM: PORTABLE CHEST 1 VIEW COMPARISON:  Portable exam 1818 hours compared to 12/28/2018 FINDINGS: Enlargement of cardiac silhouette with pulmonary vascular congestion. Atherosclerotic calcification aorta. Scattered  interstitial infiltrates likely pulmonary edema question CHF versus fluid overload. No gross pleural effusion or pneumothorax. Bones demineralized. IMPRESSION: Enlargement of cardiac silhouette with pulmonary vascular congestion and probable pulmonary edema. Electronically Signed   By: Lavonia Dana M.D.   On: 01/11/2019 18:54   Vas [place] Lower Extremity Venous (dvt)  Result Date: 01/12/2019  Lower Venous Study Indications: Positive d dimer.  Limitations: Patient movement. Performing Technologist: [name] RVS  Examination Guidelines: A complete evaluation includes B-mode imaging, spectral Doppler, color Doppler, and power Doppler as needed of all accessible portions of each vessel. Bilateral testing is considered an integral part of a complete examination. Limited examinations for reoccurring indications may be performed as noted.  +---------+---------------+---------+-----------+----------+--------------+ RIGHT    CompressibilityPhasicitySpontaneityPropertiesSummary        +---------+---------------+---------+-----------+----------+--------------+ CFV      Full           Yes      Yes                                 +---------+---------------+---------+-----------+----------+--------------+ SFJ      Full                                                        +---------+---------------+---------+-----------+----------+--------------+  FV Prox  Full                                                        +---------+---------------+---------+-----------+----------+--------------+ FV Mid   Full                                                        +---------+---------------+---------+-----------+----------+--------------+ FV DistalFull                                                        +---------+---------------+---------+-----------+----------+--------------+ PFV      Full                                                         +---------+---------------+---------+-----------+----------+--------------+ POP      Full           Yes      Yes                                 +---------+---------------+---------+-----------+----------+--------------+ PTV                                                   Not visualized +---------+---------------+---------+-----------+----------+--------------+ PERO                                                  Not visualized +---------+---------------+---------+-----------+----------+--------------+   +---------+---------------+---------+-----------+----------+--------------+ LEFT     CompressibilityPhasicitySpontaneityPropertiesSummary        +---------+---------------+---------+-----------+----------+--------------+ CFV      Full           Yes      Yes                                 +---------+---------------+---------+-----------+----------+--------------+ SFJ      Full                                                        +---------+---------------+---------+-----------+----------+--------------+ FV Prox  Full                                                        +---------+---------------+---------+-----------+----------+--------------+  FV Mid   Full                                                        +---------+---------------+---------+-----------+----------+--------------+ FV DistalFull                                                        +---------+---------------+---------+-----------+----------+--------------+ PFV      Full                                                        +---------+---------------+---------+-----------+----------+--------------+ POP      Full           Yes      Yes                                 +---------+---------------+---------+-----------+----------+--------------+ PTV      Full                                                         +---------+---------------+---------+-----------+----------+--------------+ PERO                                                  Not visualized +---------+---------------+---------+-----------+----------+--------------+     Summary: Right: There is no evidence of deep vein thrombosis in the lower extremity. However, portions of this examination were limited- see technologist comments above. No cystic structure found in the popliteal fossa. Left: There is no evidence of deep vein thrombosis in the lower extremity. However, portions of this examination were limited- see technologist comments above. No cystic structure found in the popliteal fossa.  *See table(s) above for measurements and observations.    Preliminary     Scheduled Meds: . aspirin EC  81 mg Oral Daily  . [START ON 01/13/2019] calcitRIOL  0.25 mcg Oral Q T,Th,Sat-1800  . Chlorhexidine Gluconate Cloth  6 each Topical Q0600  . cloNIDine  0.1 mg Transdermal Weekly  . fluticasone  2 spray Each Nare Daily  . folic acid  1 mg Oral Daily  . gabapentin  100 mg Oral TID  . insulin aspart  0-9 Units Subcutaneous TID WC  . insulin glargine  3 Units Subcutaneous QHS  . ipratropium  0.5 mg Inhalation Q4H  . labetalol  5 mg Intravenous Q8H  . LORazepam  1 mg Oral Once  . multivitamin with minerals  1 tablet Oral Daily  . nicotine  21 mg Transdermal Daily  . pantoprazole  40 mg Oral Daily  . senna-docusate  1 tablet Oral BID  . sevelamer carbonate  1,600 mg Oral TID WC  . thiamine  100 mg  Oral Daily   Or  . thiamine  100 mg Intravenous Daily    Continuous Infusions:   Time spent: 2mins I have personally reviewed and interpreted on  01/12/2019 daily labs, tele strips, imagings as discussed above under date review session and assessment and plans.  I reviewed all nursing notes, pharmacy notes, consultant notes,  vitals, pertinent old records  I have discussed plan of care as described above with RN , patient on 01/12/2019   Florencia Reasons MD, PhD  [facility] Pager [phone]. If 7PM-7AM, please contact night-coverage at [email or web address], password [facility] 01/12/2019, 10:22 AM  LOS: 0 days

## 2019-01-12 NOTE — Progress Notes (Signed)
Pt arrived to unit with altered mental status related to meds given previously while in ED. Dialysis RN present in room requesting consent which pt unable to give. Pts son was contacted via telephone and consent was obtained with 2 RN verifiers. Hemodialysis RN made aware and he prepared consent. Consent was placed in pt chart. Will continue to monitor.

## 2019-01-12 NOTE — Progress Notes (Signed)
RT NOTE: RT removed patient from bipap and placed on 2L . Vitals are stable and sats are 100%. Patient is resting comfortably. RT will continue to monitor.

## 2019-01-12 NOTE — Progress Notes (Signed)
[facility] Progress Note  Subjective: 3L off w/ HD yest, pt still agitated per staff, is currently sedated after IV ativan.   Vitals:   01/12/19 0723 01/12/19 0728 01/12/19 0831 01/12/19 0938  BP:  (!) 181/93 (!) 195/99   Pulse: (!) 112 (!) 111 (!) 119 (!) 108  Resp: (!) 26 (!) 23  20  Temp:  98.6 F (37 C)    TempSrc:  Axillary    SpO2: 96% 97%  100%  Weight:        Inpatient medications: . aspirin EC  81 mg Oral Daily  . [START ON 01/13/2019] calcitRIOL  0.25 mcg Oral Q T,Th,Sat-1800  . Chlorhexidine Gluconate Cloth  6 each Topical Q0600  . cloNIDine  0.1 mg Transdermal Weekly  . fluticasone  2 spray Each Nare Daily  . folic acid  1 mg Oral Daily  . gabapentin  100 mg Oral TID  . insulin aspart  0-9 Units Subcutaneous TID WC  . insulin glargine  3 Units Subcutaneous QHS  . ipratropium  0.5 mg Inhalation Q4H  . labetalol  5 mg Intravenous Q8H  . LORazepam  1 mg Oral Once  . multivitamin with minerals  1 tablet Oral Daily  . nicotine  21 mg Transdermal Daily  . senna-docusate  1 tablet Oral BID  . sevelamer carbonate  1,600 mg Oral TID WC  . thiamine  100 mg Oral Daily   Or  . thiamine  100 mg Intravenous Daily    dextromethorphan-guaiFENesin, diphenhydrAMINE-zinc acetate, hydrALAZINE, levalbuterol, LORazepam **OR** LORazepam, morphine injection, nitroGLYCERIN, ondansetron **OR** ondansetron (ZOFRAN) IV, oxyCODONE    Exam: Gen looks very restless, pursed lips expiration, clutching her left chest, SpO2 No rash, cyanosis or gangrene Sclera anicteric, throat clear +JVD Chest bibasilar rales, no wheezing RRR no MRG Abd soft ntnd no mass or ascites +bs GU  defer MS no joint effusions or deformity Ext trace LE edema, no wounds or ulcers Neuro is alert, Ox 3 , nonfocal    Home meds:  - amlodipine 5 hs  - insulin aspart 0-9 u tid ac ssi/ insulin glargine 5u qhs  - pantoprazole 40 qd  - sevelamer carbonate 2 ac tid    [facility]  4h  400/800   54kg  2/2.25 bath  Hep 3000  LUA AVG   - hectorol 5 ug  - venofer 100 mg thru ?  - mircera 100 mg every 2 wks , last ?   Assessment: 1. Dyspnea - +CXR pulm edema yest, w/ 3L off last night but CXR not much better and CT chest looks very abnormal.   2. Agitation - not sure cause, could be respiratory 3. Chest pain - trop flat, eKG neg and CTA chest negative for PE (old pe) 4. ESRD on HD: missed last 3 sessions for recurrent diarrhea. Had L AVG declot w/ arterial revision on 12/11/18. Had HD last night. Next HD tomorrow on schedule. 5. Diarrhea  6. HTN on norvasc 7. Drug abuse - not currently using drugs per pt 8. Smoker - trying to quit, has nicotine patch on 9. Anemia ckd - Hb 9.8, follow 10. Hep C 11. H/o CVA 12. DM on insulin       [facility] 01/12/2019, 10:48 AM  Iron/TIBC/Ferritin/ %Sat    Component Value Date/Time   IRON 112 10/28/2018 1211   TIBC 231 (L) 10/28/2018 1211   FERRITIN 573 (H) 09/23/2018 2113   IRONPCTSAT 48 (H) 10/28/2018 1211  Recent Labs  Lab 01/11/19 2043 01/12/19 0828  NA  --  139  K  --  4.5  CL  --  101  CO2  --  23  GLUCOSE  --  90  BUN  --  51*  CREATININE  --  10.01*  CALCIUM  --  7.9*  INR 1.2  --    No results for input(s): AST, ALT, ALKPHOS, BILITOT, PROT in the last 168 hours. Recent Labs  Lab 01/12/19 0828  WBC 9.0  HGB 9.2*  HCT 29.2*  PLT 274

## 2019-01-12 NOTE — Progress Notes (Signed)
  Echocardiogram 2D Echocardiogram has been performed.  Rhonda Lynch 01/12/2019, 1:58 PM

## 2019-01-13 DIAGNOSIS — Z9119 Patient's noncompliance with other medical treatment and regimen: Secondary | ICD-10-CM

## 2019-01-13 LAB — GLUCOSE, CAPILLARY
Glucose-Capillary: 68 mg/dL — ABNORMAL LOW (ref 70–99)
Glucose-Capillary: 116 mg/dL — ABNORMAL HIGH (ref 70–99)
Glucose-Capillary: 85 mg/dL (ref 70–99)
Glucose-Capillary: 153 mg/dL — ABNORMAL HIGH (ref 70–99)
Glucose-Capillary: 110 mg/dL — ABNORMAL HIGH (ref 70–99)
Glucose-Capillary: 132 mg/dL — ABNORMAL HIGH (ref 70–99)

## 2019-01-13 LAB — CBC
HCT: 26.5 % — ABNORMAL LOW (ref 36.0–46.0)
Hemoglobin: 8.4 g/dL — ABNORMAL LOW (ref 12.0–15.0)
MCH: 24.2 pg — ABNORMAL LOW (ref 26.0–34.0)
MCHC: 31.7 g/dL (ref 30.0–36.0)
MCV: 76.4 fL — ABNORMAL LOW (ref 80.0–100.0)
Platelets: 262 10*3/uL (ref 150–400)
RBC: 3.47 MIL/uL — ABNORMAL LOW (ref 3.87–5.11)
RDW: 22.6 % — ABNORMAL HIGH (ref 11.5–15.5)
WBC: 7.2 10*3/uL (ref 4.0–10.5)
nRBC: 0.4 % — ABNORMAL HIGH (ref 0.0–0.2)

## 2019-01-13 MED ORDER — NEPRO/CARBSTEADY PO LIQD
237.0000 mL | Freq: Two times a day (BID) | ORAL | Status: DC
  Administered 2019-01-14 – 2019-01-19 (×10): 237 mL via ORAL
  Filled 2019-01-13 (×9): qty 237

## 2019-01-13 MED ORDER — GUAIFENESIN ER 600 MG PO TB12
600.0000 mg | ORAL_TABLET | Freq: Two times a day (BID) | ORAL | Status: DC
  Administered 2019-01-13 – 2019-01-20 (×13): 600 mg via ORAL
  Filled 2019-01-13 (×14): qty 1

## 2019-01-13 MED ORDER — HEPARIN SODIUM (PORCINE) 5000 UNIT/ML IJ SOLN
5000.0000 [IU] | Freq: Three times a day (TID) | INTRAMUSCULAR | Status: AC
  Administered 2019-01-13: 21:00:00 5000 [IU] via SUBCUTANEOUS
  Filled 2019-01-13: qty 1

## 2019-01-13 MED ORDER — DEXTROSE 50 % IV SOLN
INTRAVENOUS | Status: AC
  Administered 2019-01-13: 07:00:00 25 mL
  Filled 2019-01-13: qty 50

## 2019-01-13 MED ORDER — DOXERCALCIFEROL 4 MCG/2ML IV SOLN
5.0000 ug | INTRAVENOUS | Status: DC
  Administered 2019-01-15 – 2019-01-20 (×4): 5 ug via INTRAVENOUS
  Filled 2019-01-13 (×4): qty 4

## 2019-01-13 MED ORDER — DARBEPOETIN ALFA 150 MCG/0.3ML IJ SOSY
150.0000 ug | PREFILLED_SYRINGE | INTRAMUSCULAR | Status: DC
  Filled 2019-01-13: qty 0.3

## 2019-01-13 NOTE — Progress Notes (Signed)
PROGRESS NOTE  Rhonda Lynch IFO:[id number] DOB: 1956-07-22 DOA: 01/11/2019 PCP: Clent Demark, PA-C  HPI/Recap of past 24 hours:   She is aaix3, no agitation, no confusion, she continue to report left side chest pain, intermittent congested cough, she is on 3liter 02, no acute distress, no edema  Blood pressure and heart rate has much improved  She was npo yesterday due to confusion, am blood glucose is 90  She wants to eat   Assessment/Plan: Principal Problem:   Acute pulmonary edema (HCC) Active Problems:   TOBACCO ABUSE   Alcohol abuse   Cocaine abuse (HCC)   Elevated troponin   Hypertension   Acute respiratory failure with hypoxia (HCC)   Insulin dependent diabetes mellitus (HCC)   Chest pain   End-stage renal disease on hemodialysis (HCC)   Hyperkalemia   Acute on chronic systolic CHF (congestive heart failure) (HCC)   Fluid overload   Acute metabolic encephalopathy  Chest pain -per ED triage intake "Pt arrives via ems from [facility] with complaints of cp onset this afternoon. Pt with CKD on dialysis. Last treatment was a week ago. Pt also complains of cough X3 days. Afebrile with ems. 170/110, HR 110, RR 32, 73% RA, 100% NRB, 98.1, CBG 126. 324mg  asa and 2 SL nitroglycerin given en route. Pt unable to sit still on ems stretcher. Pt appears anxious. " -CTA negative fot PE or dissection -Troponin mild and flat -echocardiogram negative for  peircarditis -Continue PPI for possible esophagitis/gastritis -Chest pain could also from cocaine and hypertension, supportive care  Acute hypoxic respiratory failure/acute on chronic combined chf due to missing dialysis -cxr on presentation with pulmonary edema -she got urgent dialysis with 3liters removal on admission -cxr post dialysis did not show much improvement, she has a h/o vasculitis/BOOP in the past, nephrology consulted pulmonary /critical care -she is improving  Speech eval recommendation: dysphagia III  diet/thin liquid  Acute metabolic encephalopathy --per chart review, although there is a documentation of alcohol use, but alcohol level in the past tested was never elevated, uds tests in the past consistent with  cocaine -encephalopathy likely from intoxication from cocaine/ Hypoxia and Uremia -resolved on 5/5, avoid ativan, avoid narcotics  ESRD TTS Noncompliance, missed HD for a week prior to admission  HTN:  On schedule labetalol,  Started on clonidine patch   Insulin dependent dm2  -with hypoglycemia while npo -a1c 4.8 - now started on diet, continue on ssi  Microcytic anemia H/o schistocytes, polychromasia and target cells on 4/20, nonreported currently hgb 9.2, mcv 75.6  Polysubstance abuse Long history of  cocaine use uds pending collection    Code Status: full  Family Communication: patient   Disposition Plan: remain in stepdown   Consultants:  Nephrology  Critical care  Procedures:  bipap  Antibiotics:  none   Objective: BP (!) 148/72 (BP Location: Right Arm)   Pulse 75   Temp 97.9 F (36.6 C) (Axillary)   Resp 14   Wt 57.4 kg   SpO2 99%   BMI 23.91 kg/m   Intake/Output Summary (Last 24 hours) at 01/13/2019 1030 Last data filed at 01/12/2019 1600 Gross per 24 hour  Intake 0 ml  Output -  Net 0 ml   Filed Weights   01/12/19 0000 01/12/19 0345  Weight: 60.9 kg 57.4 kg    Exam: Patient is examined daily including today on 01/13/2019, exams remain the same as of yesterday except that has changed    General:  Aaox3,  no confusion, no agitation  Cardiovascular: RRR  Respiratory: crackles, rhonchi , no wheezing  Abdomen: Soft/ND/NT, positive BS  Musculoskeletal: trace Edema  Neuro: aaox3  Data Reviewed: Basic Metabolic Panel: Recent Labs  Lab 01/11/19 1755 01/11/19 1800 01/12/19 0828  NA 142 141 139  K 5.5* 5.4* 4.5  CL 103  --  101  CO2 18*  --  23  GLUCOSE 126*  --  90  BUN 116*  --  51*  CREATININE 16.93*  --   10.01*  CALCIUM 7.3*  --  7.9*   Liver Function Tests: No results for input(s): AST, ALT, ALKPHOS, BILITOT, PROT, ALBUMIN in the last 168 hours. No results for input(s): LIPASE, AMYLASE in the last 168 hours. No results for input(s): AMMONIA in the last 168 hours. CBC: Recent Labs  Lab 01/11/19 1755 01/11/19 1800 01/12/19 0828 01/13/19 0208  WBC 9.2  --  9.0 7.2  NEUTROABS 6.7  --   --   --   HGB 9.0* 10.5* 9.2* 8.4*  HCT 29.3* 31.0* 29.2* 26.5*  MCV 77.3*  --  75.6* 76.4*  PLT 310  --  274 262   Cardiac Enzymes:   Recent Labs  Lab 01/11/19 1755 01/11/19 2043 01/12/19 0242 01/12/19 0828  TROPONINI 0.06* 0.07* 0.07* 0.05*   BNP (last 3 results) Recent Labs    02/12/18 1534 06/20/18 2042 12/03/18 1209  BNP 225.7* >4,500.0* >4,500.0*    ProBNP (last 3 results) No results for input(s): PROBNP in the last 8760 hours.  CBG: Recent Labs  Lab 01/12/19 2100 01/12/19 2314 01/13/19 0627 01/13/19 0657 01/13/19 0809  GLUCAP 85 89 68* 116* 85    Recent Results (from the past 240 hour(s))  SARS Coronavirus 2 (CEPHEID- Performed in [facility] hospital lab), Hosp Order     Status: None   Collection Time: 01/11/19  7:57 PM  Result Value Ref Range Status   SARS Coronavirus 2 NEGATIVE NEGATIVE Final    Comment: (NOTE) If result is NEGATIVE SARS-CoV-2 target nucleic acids are NOT DETECTED. The SARS-CoV-2 RNA is generally detectable in upper and lower  respiratory specimens during the acute phase of infection. The lowest  concentration of SARS-CoV-2 viral copies this assay can detect is 250  copies / mL. A negative result does not preclude SARS-CoV-2 infection  and should not be used as the sole basis for treatment or other  patient management decisions.  A negative result may occur with  improper specimen collection / handling, submission of specimen other  than nasopharyngeal swab, presence of viral mutation(s) within the  areas targeted by this assay, and inadequate  number of viral copies  (<250 copies / mL). A negative result must be combined with clinical  observations, patient history, and epidemiological information. If result is POSITIVE SARS-CoV-2 target nucleic acids are DETECTED. The SARS-CoV-2 RNA is generally detectable in upper and lower  respiratory specimens dur ing the acute phase of infection.  Positive  results are indicative of active infection with SARS-CoV-2.  Clinical  correlation with patient history and other diagnostic information is  necessary to determine patient infection status.  Positive results do  not rule out bacterial infection or co-infection with other viruses. If result is PRESUMPTIVE POSTIVE SARS-CoV-2 nucleic acids MAY BE PRESENT.   A presumptive positive result was obtained on the submitted specimen  and confirmed on repeat testing.  While 2019 novel coronavirus  (SARS-CoV-2) nucleic acids may be present in the submitted sample  additional confirmatory testing may be necessary  for epidemiological  and / or clinical management purposes  to differentiate between  SARS-CoV-2 and other Sarbecovirus currently known to infect humans.  If clinically indicated additional testing with an alternate test  methodology [phone]) is advised. The SARS-CoV-2 RNA is generally  detectable in upper and lower respiratory sp ecimens during the acute  phase of infection. The expected result is Negative. Fact Sheet for Patients:  StrictlyIdeas.no Fact Sheet for Healthcare Providers: BankingDealers.co.za This test is not yet approved or cleared by the [place] FDA and has been authorized for detection and/or diagnosis of SARS-CoV-2 by FDA under an Emergency Use Authorization (EUA).  This EUA will remain in effect (meaning this test can be used) for the duration of the COVID-19 declaration under Section 564(b)(1) of the Act, 21 U.S.C. section 360bbb-3(b)(1), unless the  authorization is terminated or revoked sooner. Performed at [facility], [place] [address] [place], [place] [address]      Studies: No results found.  Scheduled Meds: . aspirin EC  81 mg Oral Daily  . calcitRIOL  0.25 mcg Oral Q T,Th,Sat-1800  . Chlorhexidine Gluconate Cloth  6 each Topical Q0600  . cloNIDine  0.1 mg Transdermal Weekly  . fluticasone  2 spray Each Nare Daily  . folic acid  1 mg Oral Daily  . gabapentin  100 mg Oral TID  . guaiFENesin  600 mg Oral BID  . insulin aspart  0-9 Units Subcutaneous TID WC  . insulin glargine  3 Units Subcutaneous QHS  . multivitamin with minerals  1 tablet Oral Daily  . nicotine  21 mg Transdermal Daily  . senna-docusate  1 tablet Oral BID  . sevelamer carbonate  1,600 mg Oral TID WC  . thiamine  100 mg Oral Daily   Or  . thiamine  100 mg Intravenous Daily    Continuous Infusions:   Time spent: 58mins I have personally reviewed and interpreted on  01/13/2019 daily labs, tele strips, imagings as discussed above under date review session and assessment and plans.  I reviewed all nursing notes, pharmacy notes, consultant notes,  vitals, pertinent old records  I have discussed plan of care as described above with RN , patient on 01/13/2019   Florencia Reasons MD, PhD  [facility] Pager [phone]. If 7PM-7AM, please contact night-coverage at [email or web address], password [facility] 01/13/2019, 10:30 AM  LOS: 1 day

## 2019-01-13 NOTE — Progress Notes (Signed)
RT Note:  Patient in room, no distress noted, sats are at 94%.  Will continue to monitor.

## 2019-01-13 NOTE — Evaluation (Signed)
Clinical/Bedside Swallow Evaluation Patient Details  Name: Rhonda Lynch MRN: [id number] Date of Birth: December 30, 1955  Today's Date: 01/13/2019 Time: SLP Start Time (ACUTE ONLY): 7867 SLP Stop Time (ACUTE ONLY): 0851 SLP Time Calculation (min) (ACUTE ONLY): 28 min  Past Medical History:  Past Medical History:  Diagnosis Date  . Diabetes mellitus without complication ([place])   . ESRD (end stage renal disease) on dialysis ([place])    "TTS; [facility]; they're moving me to another one" (09/22/2018)  . Hepatitis C    diagnosed 09/2018  . Hypertension   . Oxygen deficiency    2 L at night  . Stroke ([place])   . Substance abuse ([place])    has been clean for 4 months    Past Surgical History:  Past Surgical History:  Procedure Laterality Date  . AV FISTULA PLACEMENT Left 03/05/2018   Procedure: ARTERIOVENOUS (AV) FISTULA CREATION  LEFT UPPER EXTREMITY;  Surgeon: [name], MD;  Location: [place] [place];  Service: Vascular;  Laterality: Left;  . AV FISTULA PLACEMENT Left 07/21/2018   Procedure: ARTERIOVENOUS (AV) FISTULA CREATION;  Surgeon: Marty Heck, MD;  Location: [facility];  Service: Vascular;  Laterality: Left;  . AV FISTULA PLACEMENT Left 10/27/2018   Procedure: INSERTION OF ARTERIOVENOUS (AV) GORE-TEX GRAFT UPPER ARM;  Surgeon: Marty Heck, MD;  Location: [place];  Service: Vascular;  Laterality: Left;  . BASCILIC VEIN TRANSPOSITION Left 09/29/2018   Procedure: BASILIC VEIN TRANSPOSITION SECOND STAGE LEFT UPPER ARM;  Surgeon: Marty Heck, MD;  Location: [place];  Service: Vascular;  Laterality: Left;  . HEMATOMA EVACUATION Left 03/06/2018   Procedure: EVACUATION HEMATOMA LEFT ARM;  Surgeon: Angelia Mould, MD;  Location: [facility];  Service: Vascular;  Laterality: Left;  . I&D EXTREMITY Left 10/06/2018   Procedure: IRRIGATION AND DEBRIDEMENT ARM;  Surgeon: Marty Heck, MD;  Location: [place];  Service: Vascular;  Laterality: Left;  . INSERTION OF DIALYSIS CATHETER Left  04/29/2018   Procedure: INSERTION OF DIALYSIS CATHETER LEFT INTERNAL JUGULAR;  Surgeon: Angelia Mould, MD;  Location: [facility];  Service: Vascular;  Laterality: Left;  . IR FLUORO GUIDE CV LINE RIGHT  02/18/2018  . IR FLUORO GUIDE CV LINE RIGHT  02/26/2018  . IR REMOVAL TUN CV CATH W/O FL  04/27/2018  . IR US GUIDE VASC ACCESS RIGHT  02/18/2018  . TEE WITHOUT CARDIOVERSION N/A 04/29/2018   Procedure: TRANSESOPHAGEAL ECHOCARDIOGRAM (TEE);  Surgeon: Sueanne Margarita, MD;  [facility];  Service: Cardiovascular;  Laterality: N/A;  . THROMBECTOMY AND REVISION OF ARTERIOVENTOUS (AV) GORETEX  GRAFT Left 12/11/2018   Procedure: THROMBECTOMY AND  REVISION  ARTERIOVENTOUS GORETEX  GRAFT LEFT ARM;  Surgeon: [name], MD;  Location: [facility];  Service: Vascular;  Laterality: Left;  Marland Kitchen VENOGRAM Left 10/27/2018   Procedure: LEFT CENTRAL VENOGRAM;  Surgeon: Marty Heck, MD;  Location: [facility];  Service: Vascular;  Laterality: Left;   HPI:  Rhonda Lynch is a 63 y.o. female who was admitted 5/3 with SOB and chest pain felt to be due to acute pulmonary edema in the setting of dialysis non-compliance. 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 Hand-over-hand assist for self-feeding  appeared to facilitate attention to task, with increased automaticity noted, but as trials continued and she began to take much larger, sequential boluses, explosive coughing began. Question if Rhonda Lynch may have a mild baseline dysphagia (oropharyngeal and/or esophageal) that is currently exacerbated by mentation. Per RN, Rhonda Lynch's NPO status was increasing her agitation on previous date. Recommend  allowing small bites of puree and sips of water with full staff supervision, only when Rhonda Lynch is fully awake and upright. Would follow aspiration and esophageal precautions. SLP will f/u for readiness to resume PO diet versus need for further testing if signs of dysphagia and suspected aspiration persist as mentation improves.   SLP Visit Diagnosis: Dysphagia, unspecified (R13.10)    Aspiration Risk  Moderate aspiration risk    Diet Recommendation NPO except meds;Ice chips PRN after oral care;Free water protocol after oral care   Medication Administration: Crushed with puree    Other  Recommendations Oral Care Recommendations: Oral care QID   Follow up Recommendations (tba)      Frequency and Duration min 2x/week  2 weeks       Prognosis Prognosis for Safe Diet Advancement: Good Barriers to Reach Goals: Cognitive deficits      Swallow Study   General HPI: Rhonda Lynch is a 62 y.o. female who was admitted 5/3 with SOB and chest pain felt to be due to acute pulmonary edema in the setting of dialysis non-compliance. 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),  DM, HTN, stroke, polysubstance abuse (tobacco, heroin, alcohol, cocaine), HCV Type of Study: Bedside Swallow Evaluation Previous Swallow Assessment: none in chart Diet Prior to this Study: NPO Temperature Spikes Noted: No Respiratory Status: Nasal cannula History of Recent Intubation: No Behavior/Cognition: Lethargic/Drowsy;Distractible;Requires cueing Oral Cavity Assessment: Excessive secretions Oral Care Completed by SLP: No Oral Cavity - Dentition: Poor condition Self-Feeding Abilities: Needs assist Patient Positioning: Upright in bed Baseline Vocal Quality: Normal Volitional Swallow: Able to elicit    Oral/Motor/Sensory Function Overall Oral Motor/Sensory Function: (difficulty following commands for assessment)   Ice Chips Ice chips:  Impaired Presentation: Spoon Oral Phase Impairments: Poor awareness of bolus   Thin Liquid Thin Liquid: Impaired Presentation: Cup;Self Fed;Spoon;Straw Oral Phase Impairments: Poor awareness of bolus Pharyngeal  Phase Impairments: Cough - Immediate;Cough - Delayed    Nectar Thick Nectar Thick Liquid: Not tested   Honey Thick Honey Thick Liquid: Not tested   Puree Puree: Impaired Presentation: Self Fed;Spoon Oral Phase Impairments: Poor awareness of bolus Pharyngeal Phase Impairments: Cough - Delayed   Solid     Solid: Not tested      Venita Sheffield Glenroy Crossen 01/13/2019,9:28 AM  [name], M.A. [place] Acute Environmental education officer [phone] Office [phone]

## 2019-01-13 NOTE — Progress Notes (Signed)
  Speech Language Pathology Treatment: Dysphagia  Patient Details Name: Rhonda Lynch MRN: [id number] DOB: 03-16-1956 Today's Date: 01/13/2019 Time: 2297-9892 SLP Time Calculation (min) (ACUTE ONLY): 9 min  Assessment / Plan / Recommendation Clinical Impression  SLP was notified by RN that pt was much more alert than during initial evaluation, so swallow tx was done with additional diagnostic PO trials. Pt had a prolonged oral phase, likely related to condition of dentition as well as large, impulsive bites. Min cues were given for smaller boluses, but no overt signs of aspiration were observed even during periods of impulsivity. Will start Dys 3 diet and thin liquids (MD present and made aware). Would recommend full supervision today given fluctuating mentation and impulsivity with risk for aspiration present, particularly given findings this morning. Will f/u for tolerance and hopeful advancement of solids as her mentation improves.   HPI HPI: Pt is a 63 y.o. female who was admitted 5/3 with SOB and chest pain felt to be due to acute pulmonary edema in the setting of dialysis non-compliance. 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),  DM, HTN, stroke, polysubstance abuse (tobacco, heroin, alcohol, cocaine), HCV      SLP Plan  Continue with current plan of care       Recommendations  Diet recommendations: Dysphagia 3 (mechanical soft);Thin liquid Liquids provided via: Cup;Straw Medication Administration: Whole meds with puree Supervision: Patient able to self feed;Full supervision/cueing for compensatory strategies Compensations: Slow rate;Small sips/bites Postural Changes and/or Swallow Maneuvers: Seated upright 90 degrees                Oral Care Recommendations: Oral care BID Follow up Recommendations: (tba) SLP Visit Diagnosis: Dysphagia, unspecified (R13.10) Plan: Continue with current  plan of care       [place] Rhonda Lynch 01/13/2019, 10:42 AM  Rhonda Lynch, M.A. [place] Acute Environmental education officer [phone] Office [phone]

## 2019-01-13 NOTE — Progress Notes (Signed)
Inpatient Diabetes Program Recommendations  AACE/ADA: New Consensus Statement on Inpatient Glycemic Control (2015)  Target Ranges:  Prepandial:   less than 140 mg/dL      Peak postprandial:   less than 180 mg/dL (1-2 hours)      Critically ill patients:  140 - 180 mg/dL   Lab Results  Component Value Date   GLUCAP 153 (H) 01/13/2019   HGBA1C 4.8 12/29/2018    Review of Glycemic Control Results for [name], [name] (MRN [id number]) as of 01/13/2019 14:06  Ref. Range 01/12/2019 23:14 01/13/2019 06:27 01/13/2019 06:57 01/13/2019 08:09 01/13/2019 11:36  Glucose-Capillary Latest Ref Range: 70 - 99 mg/dL 89 68 (L) 116 (H) 85 153 (H)   Diabetes history: DM2 Outpatient Diabetes medications: Lantus 5 units + Novolog sensitive correction Current orders for Inpatient glycemic control: Lantus 3 units + Novolog sensitive correction tid  Inpatient Diabetes Program Recommendations:   Patient had hypoglycemia and did not receive Lantus hs dose last pm.  Please D/C Lantus Decrease Novolog correction to custom scale -Custom Novolog correction scale 0-5 units       151-200  1 unit      201-250  2 units      251-300  3 units      301-350  4 units      351-400  5 units  Thank you, Bethena Roys E. Eleah Lahaie, RN, MSN, CDE  Diabetes Coordinator Inpatient Glycemic Control Team Team Pager [phone] (8am-5pm) 01/13/2019 2:09 PM

## 2019-01-13 NOTE — Progress Notes (Addendum)
[facility] Progress Note   Subjective: Digging in purse, looking for food. Oriented to self and place. Denies SOB. Safety sitter at bedside.   Objective Vitals:   01/12/19 1606 01/12/19 1900 01/13/19 0300 01/13/19 0801  BP: (!) 172/102 (!) 164/96 (!) 157/90 (!) 148/72  Pulse: (!) 105 99 87 75  Resp: 16 (!) 31 15 14   Temp: 99.2 F (37.3 C) 98.2 F (36.8 C)  97.9 F (36.6 C)  TempSrc: Axillary Oral  Axillary  SpO2: 100% 100% 98% 99%  Weight:       Physical Exam General: Chronically ill appearing female in NAD Neuro: Oriented to self and place only.  Heart: S1,S2 SR on monitor. HR 90s Lungs: few bibasilar crackles, no WOB Abdomen: Active BS Extremities: trace BLE edema Dialysis Access: L AVG + bruit   Additional Objective Labs: Basic Metabolic Panel: Recent Labs  Lab 01/11/19 1755 01/11/19 1800 01/12/19 0828  NA 142 141 139  K 5.5* 5.4* 4.5  CL 103  --  101  CO2 18*  --  23  GLUCOSE 126*  --  90  BUN 116*  --  51*  CREATININE 16.93*  --  10.01*  CALCIUM 7.3*  --  7.9*   Liver Function Tests: No results for input(s): AST, ALT, ALKPHOS, BILITOT, PROT, ALBUMIN in the last 168 hours. No results for input(s): LIPASE, AMYLASE in the last 168 hours. CBC: Recent Labs  Lab 01/11/19 1755 01/11/19 1800 01/12/19 0828 01/13/19 0208  WBC 9.2  --  9.0 7.2  NEUTROABS 6.7  --   --   --   HGB 9.0* 10.5* 9.2* 8.4*  HCT 29.3* 31.0* 29.2* 26.5*  MCV 77.3*  --  75.6* 76.4*  PLT 310  --  274 262   Blood Culture    Component Value Date/Time   SDES BLOOD RIGHT WRIST 12/03/2018 1213   SPECREQUEST  12/03/2018 1213    BOTTLES DRAWN AEROBIC AND ANAEROBIC Blood Culture results may not be optimal due to an inadequate volume of blood received in culture bottles   CULT  12/03/2018 1213    NO GROWTH 5 DAYS Performed at [facility] [address] [place], [place] [address]    REPTSTATUS 12/08/2018 FINAL 12/03/2018 1213    Cardiac Enzymes: Recent Labs   Lab 01/11/19 1755 01/11/19 2043 01/12/19 0242 01/12/19 0828  TROPONINI 0.06* 0.07* 0.07* 0.05*   CBG: Recent Labs  Lab 01/12/19 2100 01/12/19 2314 01/13/19 0627 01/13/19 0657 01/13/19 0809  GLUCAP 85 89 68* 116* 85   Iron Studies: No results for input(s): IRON, TIBC, TRANSFERRIN, FERRITIN in the last 72 hours. @lablastinr3 @ Studies/Results: Ct Angio Chest Pe W Or Wo Contrast  Result Date: 01/12/2019 CLINICAL DATA:  Positive D-dimer. Intermediate probability for pulmonary embolism EXAM: CT ANGIOGRAPHY CHEST WITH CONTRAST TECHNIQUE: Multidetector CT imaging of the chest was performed using the standard protocol during bolus administration of intravenous contrast. Multiplanar CT image reconstructions and MIPs were obtained to evaluate the vascular anatomy. CONTRAST:  32mL OMNIPAQUE IOHEXOL 350 MG/ML SOLN COMPARISON:  12/03/2018 FINDINGS: Cardiovascular: Cardiomegaly without pericardial effusion. Mild atherosclerotic calcification of the aorta. Satisfactory opacification of the pulmonary arteries. No typical acute pulmonary embolism is seen. There is a web in the left lower lobe pulmonary artery and irregular filling defect with under filling of downstream vessels and is segmental left lower lobe branch. These were seen on immediate prior. There is also blunting of anterior basal segment pulmonary arteries in the right lower lobe. Questionable  posterior segment right upper lobe eccentric filling defect Mediastinum/Nodes: Negative for adenopathy or mass Lungs/Pleura: Small left pleural effusion with adjacent pulmonary opacification and volume loss. Airspace disease seen previously has significantly improved with mild generalized reticular appearance. There is mild centrilobular emphysema. Upper Abdomen: Negative Musculoskeletal: No acute finding.  Thoracic disc degeneration Review of the MIP images confirms the above findings. IMPRESSION: 1. Negative for acute pulmonary embolism. 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 2. Improved aeration since 12/03/2018. 3. Small left pleural effusion. There is lower lobe atelectasis and/or scarring. Electronically Signed   By: Monte Fantasia M.D.   On: 01/12/2019 07:04   Dg Chest Port 1 View  Result Date: 01/12/2019 CLINICAL DATA:  Left chest pain.  Nausea. EXAM: PORTABLE CHEST 1 VIEW COMPARISON:  01/11/2019 and chest CT 01/12/2019 FINDINGS: Lungs are adequately inflated with mild focal opacification over the lateral right base unchanged which may be due to atelectasis or infection. Left base/retrocardiac opacification unchanged to slightly worse likely left effusion/atelectasis as seen on recent CT. Hazy bilateral interstitial prominence suggesting interstitial edema and less likely infection. Stable cardiomegaly. Remainder of the exam is unchanged. IMPRESSION: Cardiomegaly with stable hazy interstitial prominence bilaterally suggesting edema. Stable to slightly worsening small left effusion with associated basilar atelectasis. Mild focal opacification over the lateral right base which may be due to atelectasis or infection. Electronically Signed   By: Marin Olp M.D.   On: 01/12/2019 10:30   Dg Chest Portable 1 View  Result Date: 01/11/2019 CLINICAL DATA:  LEFT chest pain, hypoxia, history chronic kidney disease on dialysis EXAM: PORTABLE CHEST 1 VIEW COMPARISON:  Portable exam 1818 hours compared to 12/28/2018 FINDINGS: Enlargement of cardiac silhouette with pulmonary vascular congestion. Atherosclerotic calcification aorta. Scattered interstitial infiltrates likely pulmonary edema question CHF versus fluid overload. No gross pleural effusion or pneumothorax. Bones demineralized. IMPRESSION: Enlargement of cardiac silhouette with pulmonary vascular congestion and probable pulmonary edema. Electronically Signed   By: Lavonia Dana M.D.   On: 01/11/2019 18:54   Vas [place] Lower Extremity Venous (dvt)  Result Date: 01/12/2019  Lower Venous  Study Indications: Positive d dimer.  Limitations: Patient movement. Performing Technologist: [name] RVS  Examination Guidelines: A complete evaluation includes B-mode imaging, spectral Doppler, color Doppler, and power Doppler as needed of all accessible portions of each vessel. Bilateral testing is considered an integral part of a complete examination. Limited examinations for reoccurring indications may be performed as noted.  +---------+---------------+---------+-----------+----------+--------------+ RIGHT    CompressibilityPhasicitySpontaneityPropertiesSummary        +---------+---------------+---------+-----------+----------+--------------+ CFV      Full           Yes      Yes                                 +---------+---------------+---------+-----------+----------+--------------+ SFJ      Full                                                        +---------+---------------+---------+-----------+----------+--------------+ FV Prox  Full                                                        +---------+---------------+---------+-----------+----------+--------------+  FV Mid   Full                                                        +---------+---------------+---------+-----------+----------+--------------+ FV DistalFull                                                        +---------+---------------+---------+-----------+----------+--------------+ PFV      Full                                                        +---------+---------------+---------+-----------+----------+--------------+ POP      Full           Yes      Yes                                 +---------+---------------+---------+-----------+----------+--------------+ PTV                                                   Not visualized +---------+---------------+---------+-----------+----------+--------------+ PERO                                                  Not  visualized +---------+---------------+---------+-----------+----------+--------------+   +---------+---------------+---------+-----------+----------+--------------+ LEFT     CompressibilityPhasicitySpontaneityPropertiesSummary        +---------+---------------+---------+-----------+----------+--------------+ CFV      Full           Yes      Yes                                 +---------+---------------+---------+-----------+----------+--------------+ SFJ      Full                                                        +---------+---------------+---------+-----------+----------+--------------+ FV Prox  Full                                                        +---------+---------------+---------+-----------+----------+--------------+ FV Mid   Full                                                        +---------+---------------+---------+-----------+----------+--------------+  FV DistalFull                                                        +---------+---------------+---------+-----------+----------+--------------+ PFV      Full                                                        +---------+---------------+---------+-----------+----------+--------------+ POP      Full           Yes      Yes                                 +---------+---------------+---------+-----------+----------+--------------+ PTV      Full                                                        +---------+---------------+---------+-----------+----------+--------------+ PERO                                                  Not visualized +---------+---------------+---------+-----------+----------+--------------+     Summary: Right: There is no evidence of deep vein thrombosis in the lower extremity. However, portions of this examination were limited- see technologist comments above. No cystic structure found in the popliteal fossa. Left: There is no evidence of deep vein  thrombosis in the lower extremity. However, portions of this examination were limited- see technologist comments above. No cystic structure found in the popliteal fossa.  *See table(s) above for measurements and observations. Electronically signed by Harold Barban MD on 01/12/2019 at 11:09:18 AM.    Final    Medications:  . aspirin EC  81 mg Oral Daily  . calcitRIOL  0.25 mcg Oral Q T,Th,Sat-1800  . Chlorhexidine Gluconate Cloth  6 each Topical Q0600  . cloNIDine  0.1 mg Transdermal Weekly  . fluticasone  2 spray Each Nare Daily  . folic acid  1 mg Oral Daily  . gabapentin  100 mg Oral TID  . guaiFENesin  600 mg Oral BID  . insulin aspart  0-9 Units Subcutaneous TID WC  . insulin glargine  3 Units Subcutaneous QHS  . multivitamin with minerals  1 tablet Oral Daily  . nicotine  21 mg Transdermal Daily  . senna-docusate  1 tablet Oral BID  . sevelamer carbonate  1,600 mg Oral TID WC  . thiamine  100 mg Oral Daily   Or  . thiamine  100 mg Intravenous Daily     [facility] 4h 400/800 54kg 2/2.25 bath Hep 3000 units IV TIW LUA AVG - hectorol 5 mcg IV TIW - venofer 100 mg IV X 5 doses 1/5 given 01/03/19 - mircera 100 mg every 2 wks , (last dose 100 mcg IV 12/25/18)   Assessment: 1. Volume overload/Acute respiratory failure D/T noncompliance with HD. Improved with HD.  2. AMS-possible withdrawal, uremia. Still not at baseline. Holding sedating meds. Per primary.  3. Chest pain - trop flat, EKG neg and CTA chest negative for PE (old pe) 4. ESRD on HD: missed last 3 sessions for recurrent diarrhea. Had L AVG declot w/ arterial revision on 12/11/18.Had HD last night. Next HD today on schedule. 5. Diarrhea-H/O C diff colitis. Per primary 6. HTN-BP elevated, continue on norvasc, lower volume as tolerated.  7. Drug abuse - not currently using drugs per pt 8. Smoker - trying to quit, has nicotine patch on 9. Anemia ckd - Hb 9.8, follow 10. BMD-Ca 7.9. Continue binders, VDRA.  Checking RFP today.  11. Hep C 12. H/o CVA 13. DM on insulin 14. DYS 3 diet.   Rita H. Brown NP-C 01/13/2019, 11:01 AM  [employer] [phone]  I have seen and examined this patient and agree with the plan of care. Pt very lethargic but arousable.  Plan on HD today to resume TTS outpt regimen.  Dwana Melena, MD 01/13/2019, 1:31 PM

## 2019-01-14 ENCOUNTER — Encounter (HOSPITAL_COMMUNITY): Payer: Self-pay | Admitting: Interventional Radiology

## 2019-01-14 ENCOUNTER — Inpatient Hospital Stay (HOSPITAL_COMMUNITY): Payer: Medicare Other

## 2019-01-14 HISTORY — PX: IR FLUORO GUIDE CV LINE RIGHT: IMG2283

## 2019-01-14 HISTORY — PX: IR US GUIDE VASC ACCESS RIGHT: IMG2390

## 2019-01-14 LAB — BASIC METABOLIC PANEL
Anion gap: 10 (ref 5–15)
Anion gap: 10 (ref 5–15)
BUN: 82 mg/dL — ABNORMAL HIGH (ref 8–23)
BUN: 22 mg/dL (ref 8–23)
CO2: 26 mmol/L (ref 22–32)
CO2: 26 mmol/L (ref 22–32)
Calcium: 7.7 mg/dL — ABNORMAL LOW (ref 8.9–10.3)
Calcium: 7.6 mg/dL — ABNORMAL LOW (ref 8.9–10.3)
Chloride: 100 mmol/L (ref 98–111)
Chloride: 97 mmol/L — ABNORMAL LOW (ref 98–111)
Creatinine, Ser: 12.82 mg/dL — ABNORMAL HIGH (ref 0.44–1.00)
Creatinine, Ser: 5.25 mg/dL — ABNORMAL HIGH (ref 0.44–1.00)
GFR calc Af Amer: 3 mL/min — ABNORMAL LOW (ref >60)
GFR calc Af Amer: 9 mL/min — ABNORMAL LOW (ref >60)
GFR calc non Af Amer: 3 mL/min — ABNORMAL LOW (ref >60)
GFR calc non Af Amer: 8 mL/min — ABNORMAL LOW (ref >60)
Glucose, Bld: 110 mg/dL — ABNORMAL HIGH (ref 70–99)
Glucose, Bld: 202 mg/dL — ABNORMAL HIGH (ref 70–99)
Potassium: 6.2 mmol/L — ABNORMAL HIGH (ref 3.5–5.1)
Potassium: 4.3 mmol/L (ref 3.5–5.1)
Sodium: 136 mmol/L (ref 135–145)
Sodium: 133 mmol/L — ABNORMAL LOW (ref 135–145)

## 2019-01-14 LAB — CBC
HCT: 23.3 % — ABNORMAL LOW (ref 36.0–46.0)
Hemoglobin: 7.4 g/dL — ABNORMAL LOW (ref 12.0–15.0)
MCH: 23.3 pg — ABNORMAL LOW (ref 26.0–34.0)
MCHC: 31.8 g/dL (ref 30.0–36.0)
MCV: 73.5 fL — ABNORMAL LOW (ref 80.0–100.0)
Platelets: 227 10*3/uL (ref 150–400)
RBC: 3.17 MIL/uL — ABNORMAL LOW (ref 3.87–5.11)
RDW: 21.5 % — ABNORMAL HIGH (ref 11.5–15.5)
WBC: 8.3 10*3/uL (ref 4.0–10.5)
nRBC: 0 % (ref 0.0–0.2)

## 2019-01-14 LAB — GLUCOSE, CAPILLARY
Glucose-Capillary: 114 mg/dL — ABNORMAL HIGH (ref 70–99)
Glucose-Capillary: 114 mg/dL — ABNORMAL HIGH (ref 70–99)
Glucose-Capillary: 107 mg/dL — ABNORMAL HIGH (ref 70–99)
Glucose-Capillary: 584 mg/dL (ref 70–99)

## 2019-01-14 LAB — PROTIME-INR
INR: 1 (ref 0.8–1.2)
Prothrombin Time: 13.3 seconds (ref 11.4–15.2)

## 2019-01-14 MED ORDER — OXYCODONE HCL 5 MG PO TABS
5.0000 mg | ORAL_TABLET | ORAL | Status: DC | PRN
  Administered 2019-01-15 – 2019-01-19 (×13): 5 mg via ORAL
  Filled 2019-01-14 (×13): qty 1

## 2019-01-14 MED ORDER — DARBEPOETIN ALFA 150 MCG/0.3ML IJ SOSY
150.0000 ug | PREFILLED_SYRINGE | Freq: Once | INTRAMUSCULAR | Status: AC
  Administered 2019-01-14: 150 ug via INTRAVENOUS

## 2019-01-14 MED ORDER — LIDOCAINE HCL 1 % IJ SOLN
INTRAMUSCULAR | Status: DC | PRN
  Administered 2019-01-14: 5 mL

## 2019-01-14 MED ORDER — HEPARIN SODIUM (PORCINE) 1000 UNIT/ML IJ SOLN
INTRAMUSCULAR | Status: AC
  Administered 2019-01-14: 2800 [IU]
  Filled 2019-01-14: qty 1

## 2019-01-14 MED ORDER — DARBEPOETIN ALFA 150 MCG/0.3ML IJ SOSY
PREFILLED_SYRINGE | INTRAMUSCULAR | Status: AC
  Filled 2019-01-14: qty 0.3

## 2019-01-14 MED ORDER — LIDOCAINE HCL 1 % IJ SOLN
INTRAMUSCULAR | Status: AC
  Filled 2019-01-14: qty 20

## 2019-01-14 MED ORDER — TRAMADOL HCL 50 MG PO TABS
50.0000 mg | ORAL_TABLET | Freq: Once | ORAL | Status: AC
  Administered 2019-01-14: 50 mg via ORAL
  Filled 2019-01-14: qty 1

## 2019-01-14 NOTE — Progress Notes (Signed)
[facility] Progress Note    Assessment/ Plan:   [facility] TTS 4h 400/800 54kg 2/2.25 bath Hep 3000 units IV TIW LUA AVG - hectorol 5 mcg IV TIW - venofer 100 mg IV X 5 doses 1/5 given 01/03/19 - mircera 100 mg every 2 wks , (last dose 100 mcg IV 12/25/18)   Assessment: 1. Volume overload/Acute respiratory failure D/T noncompliance with HD. Improved with HD.  2. AMS-possible withdrawal, uremia. Still not at baseline. Holding sedating meds. Per primary.  3. Chest pain -trop flat, EKG neg and CTA chest negative for PE (old pe) 4. ESRD on HD: missed last 3 sessions for recurrent diarrhea. Had L AVG declot w/ arterial revision on 12/11/18.Last HD was 5/4 (Mon) Unable to HD last night as she was thrombosed Appreciate VIR attempting declot today -> plan to HD afterwards. 1K bath for 1st hour.  5. Diarrhea-H/O C diff colitis. Per primary 6. HTN-BP elevated, continue on norvasc, lower volume as tolerated.  7. Drug abuse - not currently using drugsper pt 8. Smoker - trying to quit, has nicotine patch on 9. Anemia ckd - Hb 9.8 dropping. Dosing aranesp 150 x1 today w/ HD.  10. BMD-Ca 7.9. Continue binders, VDRA.   11. Hep C 12. H/o CVA 13. DM on insulin 14. DYS 3 diet.   Subjective:   C/o lt arm pain; I removed the bandages and there is no bleeding. Denies f/c/n/v.   Objective:   BP (!) 170/83 (BP Location: Right Arm)   Pulse 97   Temp 98.1 F (36.7 C) (Oral)   Resp 20   Wt 57.4 kg   SpO2 97%   BMI 23.91 kg/m   Intake/Output Summary (Last 24 hours) at 01/14/2019 0947 Last data filed at 01/13/2019 1200 Gross per 24 hour  Intake 110 ml  Output -  Net 110 ml   Weight change:   Physical Exam: General: Chronically ill appearing female in NAD Neuro: Oriented to self and place only.  Heart: S1,S2 SR on monitor. HR 90s Lungs: few bibasilar crackles, no WOB Abdomen: Active BS Extremities: trace BLE edema Dialysis Access: L AVG  NO bruit  today  Imaging: Dg Chest Port 1 View  Result Date: 01/12/2019 CLINICAL DATA:  Left chest pain.  Nausea. EXAM: PORTABLE CHEST 1 VIEW COMPARISON:  01/11/2019 and chest CT 01/12/2019 FINDINGS: Lungs are adequately inflated with mild focal opacification over the lateral right base unchanged which may be due to atelectasis or infection. Left base/retrocardiac opacification unchanged to slightly worse likely left effusion/atelectasis as seen on recent CT. Hazy bilateral interstitial prominence suggesting interstitial edema and less likely infection. Stable cardiomegaly. Remainder of the exam is unchanged. IMPRESSION: Cardiomegaly with stable hazy interstitial prominence bilaterally suggesting edema. Stable to slightly worsening small left effusion with associated basilar atelectasis. Mild focal opacification over the lateral right base which may be due to atelectasis or infection. Electronically Signed   By: Marin Olp M.D.   On: 01/12/2019 10:30    Labs: BMET Recent Labs  Lab 01/11/19 1755 01/11/19 1800 01/12/19 0828 01/14/19 0738  NA 142 141 139 136  K 5.5* 5.4* 4.5 6.2*  CL 103  --  101 100  CO2 18*  --  23 26  GLUCOSE 126*  --  90 110*  BUN 116*  --  51* 82*  CREATININE 16.93*  --  10.01* 12.82*  CALCIUM 7.3*  --  7.9* 7.7*   CBC Recent Labs  Lab 01/11/19 1755 01/11/19 1800 01/12/19 0828 01/13/19  6301 01/14/19 0738  WBC 9.2  --  9.0 7.2 8.3  NEUTROABS 6.7  --   --   --   --   HGB 9.0* 10.5* 9.2* 8.4* 7.4*  HCT 29.3* 31.0* 29.2* 26.5* 23.3*  MCV 77.3*  --  75.6* 76.4* 73.5*  PLT 310  --  274 262 227    Medications:    . aspirin EC  81 mg Oral Daily  . Chlorhexidine Gluconate Cloth  6 each Topical Q0600  . cloNIDine  0.1 mg Transdermal Weekly  . darbepoetin (ARANESP) injection - DIALYSIS  150 mcg Intravenous Q Tue-HD  . doxercalciferol  5 mcg Intravenous Q T,Th,Sa-HD  . feeding supplement (NEPRO CARB STEADY)  237 mL Oral BID BM  . fluticasone  2 spray Each Nare Daily   . folic acid  1 mg Oral Daily  . gabapentin  100 mg Oral TID  . guaiFENesin  600 mg Oral BID  . insulin aspart  0-9 Units Subcutaneous TID WC  . multivitamin with minerals  1 tablet Oral Daily  . nicotine  21 mg Transdermal Daily  . senna-docusate  1 tablet Oral BID  . sevelamer carbonate  1,600 mg Oral TID WC  . thiamine  100 mg Oral Daily   Or  . thiamine  100 mg Intravenous Daily      Otelia Santee, MD 01/14/2019, 9:47 AM

## 2019-01-14 NOTE — Progress Notes (Addendum)
CRITICAL VALUE ALERT  Critical Value:  CBG 584  Date & Time Notied:  2133  Provider Notified: Schorr, NP  Orders Received/Actions taken: Awaiting new orders   Glucose now 202

## 2019-01-14 NOTE — Procedures (Signed)
Esrd, hyperkalemia  S/p RT EJ TEMP HD CATH  Tip svcra No comp Stable ebl min Ready for use

## 2019-01-14 NOTE — Progress Notes (Signed)
SLP Cancellation Note  Patient Details Name: Rhonda Lynch MRN: [id number] DOB: 07-Sep-1956   Cancelled treatment:       Reason Eval/Treat Not Completed: Other (comment) Per chart review pt appears to be NPO for procedure. Will f/u as able for dysphagia treatment.   [name] 01/14/2019, 10:37 AM  [name], M.A. [place] Acute Environmental education officer [phone] Office [phone]

## 2019-01-14 NOTE — Progress Notes (Signed)
PROGRESS NOTE  [name]:[id number] DOB: 06/19/1956 DOA: 01/11/2019 PCP: Clent Demark, PA-C   LOS: 2 days   Brief narrative:  Rhonda Lynch is a 63 y.o. female with medical history significant of ESRD-HD (TTS), DM, HTN, stroke, GERD, HCV, sCHF, polysubstance abuse (tobacco, alcohol, heroin, cocaine), chronic elevation of troponin, presented to hospital with chest pain and shortness of breath. ED Course: pt was found to have Negative COVID-19 test, D-dimer 9.63, troponin 0.06, WBC 9.2 (CBC showed shcistocytes, pending smear) potassium 5.4, bicarbonate 18, creatinine 16.9, BUN 116, temperature normal, tachycardia, tachypnea, oxygen saturation 70s at RA per EMS-->100 on 4L nasal cannula oxygen, chest x-ray showed cardiomegaly and pulmonary edema.      Subjective:  Patient complains of left arm pain.  Asking for pain medication.  Patient denies shortness of breath, fever cough nausea or vomiting.  Assessment/Plan:  Principal Problem:   Acute pulmonary edema (HCC) Active Problems:   TOBACCO ABUSE   Alcohol abuse   Cocaine abuse (HCC)   Elevated troponin   Hypertension   Noncompliance   Acute respiratory failure with hypoxia (HCC)   Insulin dependent diabetes mellitus (HCC)   Chest pain   End-stage renal disease on hemodialysis (HCC)   Hyperkalemia   Acute on chronic systolic CHF (congestive heart failure) (HCC)   Fluid overload   Acute metabolic encephalopathy  Chest pain Atypical in nature.  CT angiogram was negative for pulmonary embolism or dissection.  Troponins are mildly elevated but flat.  Echocardiogram was negative for acute findings.  Continue PPI for esophagitis gastritis.   Acute hypoxic respiratory failure/acute on chronic combined chf due to missing dialysis and volume overload. -cxr on presentation with pulmonary edema.  Requiring supplemental oxygen.  Status post  urgent dialysis with 3liters removal on admission.  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  Currently improving.  Acute metabolic encephalopathy Likely secondary to intoxication from cocaine/uremia and hypoxia.  Improving.   Nutrition.  Dysphagia III diet/thin liquid   ESRD TTS Noncompliance, missed HD for a week prior to admission.  Nephrology on board  Essential HTN:  On schedule labetalol and clonidine patch.  Diabetes mellitus type 2. Continue diabetic diet, adding scale insulin  Microcytic anemia  Stable at this time.  Polysubstance abuse Long history of  cocaine use.  Supportive care.  VTE Prophylaxis: Heparin subcu  Code Status: Full code  Family Communication: No one available at bedside  Disposition Plan: Home   Consultants:  Nephrology, critical care  Procedures:  BiPAP  Antibiotics: Anti-infectives (From admission, onward)   None       Objective: Vitals:   01/14/19 0723 01/14/19 1100  BP: (!) 170/83 (!) 172/94  Pulse: 97 91  Resp: 20 16  Temp: 98.1 F (36.7 C) 98.4 F (36.9 C)  SpO2: 97% 95%    Intake/Output Summary (Last 24 hours) at 01/14/2019 1151 Last data filed at 01/13/2019 1200 Gross per 24 hour  Intake 110 ml  Output -  Net 110 ml   Filed Weights   01/12/19 0000 01/12/19 0345  Weight: 60.9 kg 57.4 kg   Body mass index is 23.91 kg/m.   Physical Exam: GENERAL: Patient is alert awake and communicative.  Not in obvious distress.  Chronically ill female. HENT: No scleral pallor or icterus. Pupils equally reactive to light. Oral mucosa is moist NECK: is supple, no palpable thyroid enlargement. CHEST: Clear to  auscultation. No crackles or wheezes. Non tender on palpation. Diminished breath sounds bilaterally. CVS: S1 and S2 heard, no murmur. Regular rate and rhythm. No pericardial rub. ABDOMEN: Soft, non-tender, bowel sounds are present. No palpable hepato-splenomegaly. EXTREMITIES: Trace edema.  Left  upper extremity AV graft. CNS: Cranial nerves are intact. No focal motor or sensory deficits. SKIN: warm and dry without rashes.  Data Review: I have personally reviewed the following laboratory data and studies,  CBC: Recent Labs  Lab 01/11/19 1755 01/11/19 1800 01/12/19 0828 01/13/19 0208 01/14/19 0738  WBC 9.2  --  9.0 7.2 8.3  NEUTROABS 6.7  --   --   --   --   HGB 9.0* 10.5* 9.2* 8.4* 7.4*  HCT 29.3* 31.0* 29.2* 26.5* 23.3*  MCV 77.3*  --  75.6* 76.4* 73.5*  PLT 310  --  274 262 517   Basic Metabolic Panel: Recent Labs  Lab 01/11/19 1755 01/11/19 1800 01/12/19 0828 01/14/19 0738  NA 142 141 139 136  K 5.5* 5.4* 4.5 6.2*  CL 103  --  101 100  CO2 18*  --  23 26  GLUCOSE 126*  --  90 110*  BUN 116*  --  51* 82*  CREATININE 16.93*  --  10.01* 12.82*  CALCIUM 7.3*  --  7.9* 7.7*   Liver Function Tests: No results for input(s): AST, ALT, ALKPHOS, BILITOT, PROT, ALBUMIN in the last 168 hours. No results for input(s): LIPASE, AMYLASE in the last 168 hours. No results for input(s): AMMONIA in the last 168 hours. Cardiac Enzymes: Recent Labs  Lab 01/11/19 1755 01/11/19 2043 01/12/19 0242 01/12/19 0828  TROPONINI 0.06* 0.07* 0.07* 0.05*   BNP (last 3 results) Recent Labs    02/12/18 1534 06/20/18 2042 12/03/18 1209  BNP 225.7* >4,500.0* >4,500.0*    ProBNP (last 3 results) No results for input(s): PROBNP in the last 8760 hours.  CBG: Recent Labs  Lab 01/13/19 1136 01/13/19 1636 01/13/19 2138 01/14/19 0636 01/14/19 1117  GLUCAP 153* 110* 132* 114* 114*   Recent Results (from the past 240 hour(s))  SARS Coronavirus 2 (CEPHEID- Performed in [place] hospital lab), Hosp Order     Status: None   Collection Time: 01/11/19  7:57 PM  Result Value Ref Range Status   SARS Coronavirus 2 NEGATIVE NEGATIVE Final    Comment: (NOTE) If result is NEGATIVE SARS-CoV-2 target nucleic acids are NOT DETECTED. The SARS-CoV-2 RNA is generally detectable in upper  and lower  respiratory specimens during the acute phase of infection. The lowest  concentration of SARS-CoV-2 viral copies this assay can detect is 250  copies / mL. A negative result does not preclude SARS-CoV-2 infection  and should not be used as the sole basis for treatment or other  patient management decisions.  A negative result may occur with  improper specimen collection / handling, submission of specimen other  than nasopharyngeal swab, presence of viral mutation(s) within the  areas targeted by this assay, and inadequate number of viral copies  (<250 copies / mL). A negative result must be combined with clinical  observations, patient history, and epidemiological information. If result is POSITIVE SARS-CoV-2 target nucleic acids are DETECTED. The SARS-CoV-2 RNA is generally detectable in upper and lower  respiratory specimens dur ing the acute phase of infection.  Positive  results are indicative of active infection with SARS-CoV-2.  Clinical  correlation with patient history and other diagnostic information is  necessary to determine patient infection status.  Positive  results do  not rule out bacterial infection or co-infection with other viruses. If result is PRESUMPTIVE POSTIVE SARS-CoV-2 nucleic acids MAY BE PRESENT.   A presumptive positive result was obtained on the submitted specimen  and confirmed on repeat testing.  While 2019 novel coronavirus  (SARS-CoV-2) nucleic acids may be present in the submitted sample  additional confirmatory testing may be necessary for epidemiological  and / or clinical management purposes  to differentiate between  SARS-CoV-2 and other Sarbecovirus currently known to infect humans.  If clinically indicated additional testing with an alternate test  methodology [phone]) is advised. The SARS-CoV-2 RNA is generally  detectable in upper and lower respiratory sp ecimens during the acute  phase of infection. The expected result is  Negative. Fact Sheet for Patients:  StrictlyIdeas.no Fact Sheet for Healthcare Providers: BankingDealers.co.za This test is not yet approved or cleared by the [place] FDA and has been authorized for detection and/or diagnosis of SARS-CoV-2 by FDA under an Emergency Use Authorization (EUA).  This EUA will remain in effect (meaning this test can be used) for the duration of the COVID-19 declaration under Section 564(b)(1) of the Act, 21 U.S.C. section 360bbb-3(b)(1), unless the authorization is terminated or revoked sooner. Performed at [facility], [place] [address] [place],  [address]      Studies: No results found.  Scheduled Meds: . aspirin EC  81 mg Oral Daily  . Chlorhexidine Gluconate Cloth  6 each Topical Q0600  . cloNIDine  0.1 mg Transdermal Weekly  . darbepoetin (ARANESP) injection - DIALYSIS  150 mcg Intravenous Once  . doxercalciferol  5 mcg Intravenous Q T,Th,Sa-HD  . feeding supplement (NEPRO CARB STEADY)  237 mL Oral BID BM  . fluticasone  2 spray Each Nare Daily  . folic acid  1 mg Oral Daily  . gabapentin  100 mg Oral TID  . guaiFENesin  600 mg Oral BID  . insulin aspart  0-9 Units Subcutaneous TID WC  . multivitamin with minerals  1 tablet Oral Daily  . nicotine  21 mg Transdermal Daily  . senna-docusate  1 tablet Oral BID  . sevelamer carbonate  1,600 mg Oral TID WC  . thiamine  100 mg Oral Daily   Or  . thiamine  100 mg Intravenous Daily    Continuous Infusions:   Flora Lipps, MD  [facility] 01/14/2019

## 2019-01-14 NOTE — Progress Notes (Addendum)
PT Cancellation Note  Patient Details Name: Rhonda Lynch MRN: [id number] DOB: 30-Jan-1956   Cancelled Treatment:    Reason Eval/Treat Not Completed: Patient at procedure or test/unavailable.  Pt in IR on arrival.  Now gone to HD.  Will try to see as able 5/7. 01/14/2019  Rhonda Lynch, [name] [phone]  (pager) [phone]  (office)   Rhonda Lynch 01/14/2019, 1:46 PM

## 2019-01-15 LAB — GLUCOSE, CAPILLARY
Glucose-Capillary: 127 mg/dL — ABNORMAL HIGH (ref 70–99)
Glucose-Capillary: 117 mg/dL — ABNORMAL HIGH (ref 70–99)
Glucose-Capillary: 232 mg/dL — ABNORMAL HIGH (ref 70–99)
Glucose-Capillary: 124 mg/dL — ABNORMAL HIGH (ref 70–99)

## 2019-01-15 LAB — RENAL FUNCTION PANEL
Albumin: 2.1 g/dL — ABNORMAL LOW (ref 3.5–5.0)
Anion gap: 10 (ref 5–15)
BUN: 33 mg/dL — ABNORMAL HIGH (ref 8–23)
CO2: 31 mmol/L (ref 22–32)
Calcium: 7.9 mg/dL — ABNORMAL LOW (ref 8.9–10.3)
Chloride: 94 mmol/L — ABNORMAL LOW (ref 98–111)
Creatinine, Ser: 6.99 mg/dL — ABNORMAL HIGH (ref 0.44–1.00)
GFR calc Af Amer: 7 mL/min — ABNORMAL LOW (ref >60)
GFR calc non Af Amer: 6 mL/min — ABNORMAL LOW (ref >60)
Glucose, Bld: 145 mg/dL — ABNORMAL HIGH (ref 70–99)
Phosphorus: 4.9 mg/dL — ABNORMAL HIGH (ref 2.5–4.6)
Potassium: 4.9 mmol/L (ref 3.5–5.1)
Sodium: 135 mmol/L (ref 135–145)

## 2019-01-15 LAB — CBC
HCT: 25.7 % — ABNORMAL LOW (ref 36.0–46.0)
Hemoglobin: 7.9 g/dL — ABNORMAL LOW (ref 12.0–15.0)
MCH: 23.4 pg — ABNORMAL LOW (ref 26.0–34.0)
MCHC: 30.7 g/dL (ref 30.0–36.0)
MCV: 76 fL — ABNORMAL LOW (ref 80.0–100.0)
Platelets: 209 10*3/uL (ref 150–400)
RBC: 3.38 MIL/uL — ABNORMAL LOW (ref 3.87–5.11)
RDW: 22.1 % — ABNORMAL HIGH (ref 11.5–15.5)
WBC: 7.6 10*3/uL (ref 4.0–10.5)
nRBC: 0 % (ref 0.0–0.2)

## 2019-01-15 MED ORDER — DOXERCALCIFEROL 4 MCG/2ML IV SOLN
INTRAVENOUS | Status: AC
  Administered 2019-01-15: 5 ug via INTRAVENOUS
  Filled 2019-01-15: qty 4

## 2019-01-15 MED ORDER — HEPARIN SODIUM (PORCINE) 1000 UNIT/ML IJ SOLN
INTRAMUSCULAR | Status: AC
  Administered 2019-01-15: 3400 [IU]
  Filled 2019-01-15: qty 3

## 2019-01-15 MED ORDER — SODIUM CHLORIDE 0.9 % IV SOLN
125.0000 mg | INTRAVENOUS | Status: DC
  Administered 2019-01-15 – 2019-01-20 (×3): 125 mg via INTRAVENOUS
  Filled 2019-01-15 (×5): qty 10

## 2019-01-15 NOTE — Progress Notes (Addendum)
Subjective:  No cos , no sob, tolerated hd yest  With R IJ Temp cath  Objective Vital signs in last 24 hours: Vitals:   01/14/19 2118 01/15/19 0018 01/15/19 0311 01/15/19 0313  BP: 117/69 110/67 114/67   Pulse: (!) 111 (!) 107 (!) 101 (!) 101  Resp: 16 15 (!) 29 19  Temp: 98.2 F (36.8 C) 98.2 F (36.8 C) 98 F (36.7 C)   TempSrc: Oral Oral    SpO2: 95% 92% 97% 96%  Weight:       Weight change:   Physical Exam: General:Chronically ill appearing female in NAD OX3 Heart: RRR, no m, r, g SR on monitor.  Lungs:decr bases rare crackles, no WOB Abdomen:Active BS soft , Nt, ND Extremities:nopedal  edema Dialysis Access: Rt  EJ Temp Cath /   L UA AVG  NO bruit today   [facility] T[facility] 4h 400/800 54kg 2/2.25 bath Hep 3000 units IV TIWLUA AVG - hectorol 5 mcg IV TIW - venofer 100 mg IV X 5 doses 1/5 given 01/03/19 - mircera 100 mg every 2 wks , (last dose 100 mcg IV 12/25/18)  Problem/Plan: 1. Volume overload/Acute respiratory failure D/T noncompliance with HD. Improved with HD.  Still up 3 kg per wt hd today her normal day. 2. AMS-possible withdrawal, uremia. Still not at baseline. Holding sedating meds. Per primary. 3. Chest pain -trop flat,EKG neg and CTA chest negative for PE (old pe) 4. ESRD on HD: missed last 3 sessions for recurrent diarrhea. Had L AVG declot w/ arterial revision on 12/11/18.Last HD was 5/4 (Mon)/Unable to HD on admit as she was thrombosed/ HD today on TTS  Normal schedule  Per Clotted LUA AVGG- Appreciate VIR place  Temp R IJ PC bec k was high -> HD 5/6 for 3.3hrs and 2.5L net UF. - Will d/w VVS whether declot is reasonable given recent revision of inflow early 12/2018.  5. Diarrhea-H/O C diff colitis. Per primary 6. HTN-BP elevated, continueon norvasc, lower volume as tolerated. 7. Drug abuse - not currently using drugsper pt 8. Smoker - trying to quit, has nicotine patch on 9. Anemia ckd - Hb 9.8>8.4 yest   Dosing aranesp 150 x1  Ordered  w/ HD 5/06.  10. BMD-Ca 7.6. Continue binders, VDRA.  11. Hep C 12. H/o CVA 13. DM on insulin 14. DYS 3 diet.   Ernest Haber, PA-C [facility] [phone] 01/15/2019,9:36 AM  LOS: 3 days    I have seen and examined this patient and agree with the plan of care.  Agree that d/w VVS given if it was an isolated hypotensive episode leading to thrombosis may be worthwhile to declot.  Dwana Melena, MD 01/15/2019, 10:09 AM   Labs: Basic Metabolic Panel: Recent Labs  Lab 01/12/19 0828 01/14/19 0738 01/14/19 2221  NA 139 136 133*  K 4.5 6.2* 4.3  CL 101 100 97*  CO2 23 26 26   GLUCOSE 90 110* 202*  BUN 51* 82* 22  CREATININE 10.01* 12.82* 5.25*  CALCIUM 7.9* 7.7* 7.6*   Liver Function Tests: No results for input(s): AST, ALT, ALKPHOS, BILITOT, PROT, ALBUMIN in the last 168 hours. No results for input(s): LIPASE, AMYLASE in the last 168 hours. No results for input(s): AMMONIA in the last 168 hours. CBC: Recent Labs  Lab 01/11/19 1755  01/12/19 0828 01/13/19 0208 01/14/19 0738  WBC 9.2  --  9.0 7.2 8.3  NEUTROABS 6.7  --   --   --   --  HGB 9.0*   < > 9.2* 8.4* 7.4*  HCT 29.3*   < > 29.2* 26.5* 23.3*  MCV 77.3*  --  75.6* 76.4* 73.5*  PLT 310  --  274 262 227   < > = values in this interval not displayed.   Cardiac Enzymes: Recent Labs  Lab 01/11/19 1755 01/11/19 2043 01/12/19 0242 01/12/19 0828  TROPONINI 0.06* 0.07* 0.07* 0.05*   CBG: Recent Labs  Lab 01/14/19 0636 01/14/19 1117 01/14/19 1523 01/14/19 2132 01/15/19 0621  GLUCAP 114* 114* 107* 584* 127*    Medications:  . aspirin EC  81 mg Oral Daily  . Chlorhexidine Gluconate Cloth  6 each Topical Q0600  . cloNIDine  0.1 mg Transdermal Weekly  . doxercalciferol  5 mcg Intravenous Q T,Th,Sa-HD  . feeding supplement (NEPRO CARB STEADY)  237 mL Oral BID BM  . fluticasone  2 spray Each Nare Daily  . folic acid  1 mg Oral Daily  . gabapentin  100 mg Oral TID  . guaiFENesin  600  mg Oral BID  . insulin aspart  0-9 Units Subcutaneous TID WC  . multivitamin with minerals  1 tablet Oral Daily  . nicotine  21 mg Transdermal Daily  . senna-docusate  1 tablet Oral BID  . sevelamer carbonate  1,600 mg Oral TID WC  . thiamine  100 mg Oral Daily   Or  . thiamine  100 mg Intravenous Daily

## 2019-01-15 NOTE — H&P (View-Only) (Signed)
Hospital Consult    Reason for Consult:  Permanent dialysis access and [facility] Requesting Physician:  Dr. Augustin Coupe MRN #:  [id number]  History of Present Illness: This is a 63 y.o. female with end-stage renal disease on hemodialysis.  She was admitted to the hospital with shortness of breath.  COVID test negative.  She is being seen in consultation for evaluation for new permanent dialysis access as well as tunneled dialysis catheter placement.  Left arm AV graft is now thrombosed.  Surgical history significant for several fistula attempts in the left arm.  She then had a graft placed by Dr. Carlis Abbott with a inconclusive central venogram.  This was revised by Dr. early in April of this year.  At that time that was felt to be her last attempt at salvaging a left arm access.  She is agreeable to right arm dialysis access.  She is currently dialyzing from right IJ temporary catheter.  She has had breakfast this morning.  She is not taking any blood thinners.   Past Medical History:  Diagnosis Date   Diabetes mellitus without complication ([place])    ESRD (end stage renal disease) on dialysis ([place])    "TTS; [place]; they're moving me to another one" (09/22/2018)   Hepatitis C    diagnosed 09/2018   Hypertension    Oxygen deficiency    2 L at night   Stroke [facility])    Substance abuse ([place])    has been clean for 4 months     Past Surgical History:  Procedure Laterality Date   AV FISTULA PLACEMENT Left 03/05/2018   Procedure: ARTERIOVENOUS (AV) FISTULA CREATION  LEFT UPPER EXTREMITY;  Surgeon: [name], MD;  Location: [place];  Service: Vascular;  Laterality: Left;   AV FISTULA PLACEMENT Left 07/21/2018   Procedure: ARTERIOVENOUS (AV) FISTULA CREATION;  Surgeon: Marty Heck, MD;  Location: [place];  Service: Vascular;  Laterality: Left;   AV FISTULA PLACEMENT Left 10/27/2018   Procedure: INSERTION OF ARTERIOVENOUS (AV) GORE-TEX GRAFT UPPER ARM;  Surgeon: Marty Heck, MD;   Location: [place];  Service: Vascular;  Laterality: Left;   [place] Left 09/29/2018   Procedure: BASILIC VEIN TRANSPOSITION SECOND STAGE LEFT UPPER ARM;  Surgeon: Marty Heck, MD;  Location: [place];  Service: Vascular;  Laterality: Left;   HEMATOMA EVACUATION Left 03/06/2018   Procedure: EVACUATION HEMATOMA LEFT ARM;  Surgeon: Angelia Mould, MD;  Location: [place];  Service: Vascular;  Laterality: Left;   I&D EXTREMITY Left 10/06/2018   Procedure: IRRIGATION AND DEBRIDEMENT ARM;  Surgeon: Marty Heck, MD;  Location[place];  Service: Vascular;  Laterality: Left;   INSERTION OF DIALYSIS CATHETER Left 04/29/2018   Procedure: INSERTION OF DIALYSIS CATHETER LEFT INTERNAL JUGULAR;  Surgeon: Angelia Mould, MD;  Location: [place];  Service: Vascular;  Laterality: Left;   IR FLUORO GUIDE CV LINE RIGHT  02/18/2018   IR FLUORO GUIDE CV LINE RIGHT  02/26/2018   IR FLUORO GUIDE CV LINE RIGHT  01/14/2019   IR REMOVAL TUN CV CATH W/O FL  04/27/2018   IR US GUIDE VASC ACCESS RIGHT  02/18/2018   IR US GUIDE VASC ACCESS RIGHT  01/14/2019   TEE WITHOUT CARDIOVERSION N/A 04/29/2018   Procedure: TRANSESOPHAGEAL ECHOCARDIOGRAM (TEE);  Surgeon: Sueanne Margarita, MD;  Location: [place];  Service: Cardiovascular;  Laterality: N/A;   THROMBECTOMY AND REVISION OF ARTERIOVENTOUS (AV) GORETEX  GRAFT Left 12/11/2018   Procedure: THROMBECTOMY  AND  REVISION  ARTERIOVENTOUS GORETEX  GRAFT LEFT ARM;  Surgeon: [name], MD;  Location: [facility] OR;  Service: Vascular;  Laterality: Left;   VENOGRAM Left 10/27/2018   Procedure: LEFT CENTRAL VENOGRAM;  Surgeon: Marty Heck, MD;  Location: [place];  Service: Vascular;  Laterality: Left;    Allergies  Allergen Reactions   Acetaminophen Nausea And Vomiting    Patient states Acetaminophen and Acetaminophen containing products make her nauseated. She demonstrated this 06/21/18 with nausea followed by emesis.   Hydroxyzine  Itching    Prior to Admission medications   Medication Sig Start Date End Date Taking? Authorizing Provider  amLODipine (NORVASC) 5 MG tablet Take 1 tablet (5 mg total) by mouth at bedtime. 10/13/18   Alphonzo Grieve, MD  B Complex-C-Zn-Folic Acid (DIALYVITE 268-TMHD 15) 0.8 MG TABS Take 1 tablet by mouth daily. 11/04/18   [provider]  Blood Glucose Monitoring Suppl (ACCU-CHEK AVIVA) device Use as instructed 10/29/18 10/29/19  Isabelle Course, MD  calcitRIOL (ROCALTROL) 0.25 MCG capsule Take 1 capsule (0.25 mcg total) by mouth every Monday, Wednesday, and Friday with hemodialysis. 10/29/18   Isabelle Course, MD  diphenhydrAMINE (BENADRYL) 2 % cream Apply 1 application topically 3 (three) times daily as needed for itching.    [provider]  docusate sodium (COLACE) 100 MG capsule Take 1 capsule (100 mg total) by mouth daily for 30 days. 12/29/18 01/28/19  Thurnell Lose, MD  fluticasone (FLONASE) 50 MCG/ACT nasal spray Place 2 sprays into both nostrils daily.    [provider]  glucose blood (ACCU-CHEK AVIVA) test strip Use twice per day. 10/29/18   Isabelle Course, MD  HYDROmorphone (DILAUDID) 2 MG tablet Take 2 mg by mouth every 4 (four) hours as needed for severe pain.    [provider]  insulin aspart (NOVOLOG) 100 UNIT/ML injection Inject 5 Units into the skin 3 (three) times daily with meals. Patient taking differently: Inject 0-9 Units into the skin 3 (three) times daily with meals.  10/13/18   Alphonzo Grieve, MD  insulin glargine (LANTUS) 100 UNIT/ML injection Inject 0.03 mLs (3 Units total) into the skin at bedtime. Patient taking differently: Inject 5 Units into the skin at bedtime.  10/29/18   Isabelle Course, MD  Insulin Syringe-Needle U-100 (INSULIN SYRINGE .3CC/31GX5/16") 31G X 5/16" 0.3 ML MISC Inject 1 each into the skin 4 (four) times daily. 06/27/18   Clent Demark, PA-C  Lancets (ACCU-CHEK SOFT [facility]) lancets Use twice per day. 10/29/18    Isabelle Course, MD  lidocaine-prilocaine (EMLA) cream Apply 1 application topically See admin instructions. Apply small amount to access site (AVF)before dialysis. Cover with occlusive dressing (Saran wrap) 12/29/18   [provider]  pantoprazole (PROTONIX) 40 MG tablet Take 1 tablet (40 mg total) by mouth daily. 11/21/18   Ghimire, Henreitta Leber, MD  polyethylene glycol (MIRALAX / GLYCOLAX) 17 g packet Take 17 g by mouth daily. 12/29/18   Thurnell Lose, MD  sevelamer carbonate (RENVELA) 800 MG tablet Take 2 tablets (1,600 mg total) by mouth 3 (three) times daily with meals. 11/26/18   Bonnell Public, MD    Social History   Socioeconomic History   Marital status: Single    Spouse name: Not on file   Number of children: Not on file   Years of education: Not on file   Highest education level: Not on file  Occupational History   Not on file  Social  Needs   Financial resource strain: Not on file   Food insecurity:    Worry: Not on file    Inability: Not on file   Transportation needs:    Medical: Not on file    Non-medical: Not on file  Tobacco Use   Smoking status: Current Every Day Smoker    Packs/day: 0.50    Years: 48.00    Pack years: 24.00    Types: Cigarettes   Smokeless tobacco: Never Used   Tobacco comment: trying to quit ; smoking 1 1/2 cigarettes/day  (09/22/2018)  Substance and Sexual Activity   Alcohol use: Not Currently    Comment: 09/22/2018 "stopped about 6 months ago"   Drug use: Yes    Types: Cocaine, Heroin    Comment: See notes   Sexual activity: Not Currently    Birth control/protection: None  Lifestyle   Physical activity:    Days per week: Not on file    Minutes per session: Not on file   Stress: Not on file  Relationships   Social connections:    Talks on phone: Not on file    Gets together: Not on file    Attends religious service: Not on file    Active member of club or organization: Not on file    Attends meetings of  clubs or organizations: Not on file    Relationship status: Not on file   Intimate partner violence:    Fear of current or ex partner: Not on file    Emotionally abused: Not on file    Physically abused: Not on file    Forced sexual activity: Not on file  Other Topics Concern   Not on file  Social History Narrative   Per notes, Pt lives with her son.     Family History  Problem Relation Age of Onset   Autoimmune disease Neg Hx     ROS: Otherwise negative unless mentioned in HPI  Physical Examination  Vitals:   01/15/19 0313 01/15/19 1047  BP:  118/83  Pulse: (!) 101 90  Resp: 19 14  Temp:  97.8 F (36.6 C)  SpO2: 96% 98%   Body mass index is 23.79 kg/m.  General:  WDWN in NAD Gait: Not observed HENT: WNL, normocephalic Pulmonary: normal non-labored breathing on O2 by nasal cannula Cardiac: regular Abdomen: soft, NT/ND, no masses Skin: without rashes Vascular Exam/Pulses: Symmetrical radial pulses Extremities: without ischemic changes, without Gangrene , without cellulitis; without open wounds;  Musculoskeletal: no muscle wasting or atrophy  Neurologic: A&O X 3;  No focal weakness or paresthesias are detected; speech is fluent/normal Psychiatric:  The pt has Normal affect. Lymph:  Unremarkable  CBC    Component Value Date/Time   WBC 8.3 01/14/2019 0738   RBC 3.17 (L) 01/14/2019 0738   HGB 7.4 (L) 01/14/2019 0738   HGB 8.0 (L) 03/27/2018 1107   HCT 23.3 (L) 01/14/2019 0738   HCT 25.3 (L) 03/27/2018 1107   PLT 227 01/14/2019 0738   PLT 126 (L) 03/27/2018 1107   MCV 73.5 (L) 01/14/2019 0738   MCV 83 03/27/2018 1107   MCH 23.3 (L) 01/14/2019 0738   MCHC 31.8 01/14/2019 0738   RDW 21.5 (H) 01/14/2019 0738   RDW 19.9 (H) 03/27/2018 1107   LYMPHSABS 1.5 01/11/2019 1755   LYMPHSABS 0.5 (L) 03/27/2018 1107   MONOABS 0.9 01/11/2019 1755   EOSABS 0.1 01/11/2019 1755   EOSABS 0.0 03/27/2018 1107   BASOSABS 0.0 01/11/2019 1755  BASOSABS 0.0 03/27/2018  1107    BMET    Component Value Date/Time   NA 133 (L) 01/14/2019 2221   NA 135 03/25/2018 1014   K 4.3 01/14/2019 2221   CL 97 (L) 01/14/2019 2221   CO2 26 01/14/2019 2221   GLUCOSE 202 (H) 01/14/2019 2221   BUN 22 01/14/2019 2221   BUN 96 (HH) 03/25/2018 1014   CREATININE 5.25 (H) 01/14/2019 2221   CALCIUM 7.6 (L) 01/14/2019 2221   CALCIUM 8.3 (L) 09/23/2018 1329   GFRNONAA 8 (L) 01/14/2019 2221   GFRAA 9 (L) 01/14/2019 2221    COAGS: Lab Results  Component Value Date   INR 1.0 01/14/2019   INR 1.2 01/11/2019   INR 0.94 10/27/2018     Non-Invasive Vascular Imaging:   Vein mapping performed 07/2018 demonstrates marginal right cephalic and basilic vein for dialysis conduit use   ASSESSMENT/PLAN: This is a 63 y.o. female with end-stage renal disease on hemodialysis.  - Left arm AV graft now thrombosed after recent revision last month - Plan will be to proceed with right arm dialysis access - Right arm IV will have to be moved; restrict right arm - She will be scheduled for catheter exchange and right arm AV fistula versus graft for the next available surgery day - On-call vascular surgeon Dr. Donzetta Matters will evaluate the patient later today and provide further treatment plan  Dagoberto Ligas PA-C [facility] [phone]  I have independently interviewed and examined patient and agree with PA assessment and plan above.  Left upper extremity accesses failed.  Currently has temporary catheter.  We will plan for conversion to tunneled dialysis catheter and right upper extremity access on Monday.  N.p.o. past midnight on Sunday night.  Please restrict right arm.  Brittnae Aschenbrenner C. Donzetta Matters, MD [facility] [place] Office: [phone] Pager: [phone]

## 2019-01-15 NOTE — Consult Note (Addendum)
Hospital Consult    Reason for Consult:  Permanent dialysis access and [facility] Requesting Physician:  Dr. Augustin Coupe MRN #:  [id number]  History of Present Illness: This is a 63 y.o. female with end-stage renal disease on hemodialysis.  She was admitted to the hospital with shortness of breath.  COVID test negative.  She is being seen in consultation for evaluation for new permanent dialysis access as well as tunneled dialysis catheter placement.  Left arm AV graft is now thrombosed.  Surgical history significant for several fistula attempts in the left arm.  She then had a graft placed by Dr. Carlis Abbott with a inconclusive central venogram.  This was revised by Dr. early in April of this year.  At that time that was felt to be her last attempt at salvaging a left arm access.  She is agreeable to right arm dialysis access.  She is currently dialyzing from right IJ temporary catheter.  She has had breakfast this morning.  She is not taking any blood thinners.   Past Medical History:  Diagnosis Date   Diabetes mellitus without complication ([place])    ESRD (end stage renal disease) on dialysis ([place])    "TTS; [place]; they're moving me to another one" (09/22/2018)   Hepatitis C    diagnosed 09/2018   Hypertension    Oxygen deficiency    2 L at night   Stroke [facility])    Substance abuse ([place])    has been clean for 4 months     Past Surgical History:  Procedure Laterality Date   AV FISTULA PLACEMENT Left 03/05/2018   Procedure: ARTERIOVENOUS (AV) FISTULA CREATION  LEFT UPPER EXTREMITY;  Surgeon: [name], MD;  Location: [place];  Service: Vascular;  Laterality: Left;   AV FISTULA PLACEMENT Left 07/21/2018   Procedure: ARTERIOVENOUS (AV) FISTULA CREATION;  Surgeon: Marty Heck, MD;  Location: [place];  Service: Vascular;  Laterality: Left;   AV FISTULA PLACEMENT Left 10/27/2018   Procedure: INSERTION OF ARTERIOVENOUS (AV) GORE-TEX GRAFT UPPER ARM;  Surgeon: Marty Heck, MD;   Location: [place];  Service: Vascular;  Laterality: Left;   [place] Left 09/29/2018   Procedure: BASILIC VEIN TRANSPOSITION SECOND STAGE LEFT UPPER ARM;  Surgeon: Marty Heck, MD;  Location: [place];  Service: Vascular;  Laterality: Left;   HEMATOMA EVACUATION Left 03/06/2018   Procedure: EVACUATION HEMATOMA LEFT ARM;  Surgeon: Angelia Mould, MD;  Location: [place];  Service: Vascular;  Laterality: Left;   I&D EXTREMITY Left 10/06/2018   Procedure: IRRIGATION AND DEBRIDEMENT ARM;  Surgeon: Marty Heck, MD;  Location: [place];  Service: Vascular;  Laterality: Left;   INSERTION OF DIALYSIS CATHETER Left 04/29/2018   Procedure: INSERTION OF DIALYSIS CATHETER LEFT INTERNAL JUGULAR;  Surgeon: Angelia Mould, MD;  Location: [place];  Service: Vascular;  Laterality: Left;   IR FLUORO GUIDE CV LINE RIGHT  02/18/2018   IR FLUORO GUIDE CV LINE RIGHT  02/26/2018   IR FLUORO GUIDE CV LINE RIGHT  01/14/2019   IR REMOVAL TUN CV CATH W/O FL  04/27/2018   IR US GUIDE VASC ACCESS RIGHT  02/18/2018   IR US GUIDE VASC ACCESS RIGHT  01/14/2019   TEE WITHOUT CARDIOVERSION N/A 04/29/2018   Procedure: TRANSESOPHAGEAL ECHOCARDIOGRAM (TEE);  Surgeon: Sueanne Margarita, MD;  Location: [place];  Service: Cardiovascular;  Laterality: N/A;   THROMBECTOMY AND REVISION OF ARTERIOVENTOUS (AV) GORETEX  GRAFT Left 12/11/2018   Procedure: THROMBECTOMY  AND  REVISION  ARTERIOVENTOUS GORETEX  GRAFT LEFT ARM;  Surgeon: [name], MD;  Location: [facility] OR;  Service: Vascular;  Laterality: Left;   VENOGRAM Left 10/27/2018   Procedure: LEFT CENTRAL VENOGRAM;  Surgeon: Marty Heck, MD;  Location: [place];  Service: Vascular;  Laterality: Left;    Allergies  Allergen Reactions   Acetaminophen Nausea And Vomiting    Patient states Acetaminophen and Acetaminophen containing products make her nauseated. She demonstrated this 06/21/18 with nausea followed by emesis.   Hydroxyzine  Itching    Prior to Admission medications   Medication Sig Start Date End Date Taking? Authorizing Provider  amLODipine (NORVASC) 5 MG tablet Take 1 tablet (5 mg total) by mouth at bedtime. 10/13/18   Alphonzo Grieve, MD  B Complex-C-Zn-Folic Acid (DIALYVITE 098-JXBJ 15) 0.8 MG TABS Take 1 tablet by mouth daily. 11/04/18   [provider]  Blood Glucose Monitoring Suppl (ACCU-CHEK AVIVA) device Use as instructed 10/29/18 10/29/19  Isabelle Course, MD  calcitRIOL (ROCALTROL) 0.25 MCG capsule Take 1 capsule (0.25 mcg total) by mouth every Monday, Wednesday, and Friday with hemodialysis. 10/29/18   Isabelle Course, MD  diphenhydrAMINE (BENADRYL) 2 % cream Apply 1 application topically 3 (three) times daily as needed for itching.    [provider]  docusate sodium (COLACE) 100 MG capsule Take 1 capsule (100 mg total) by mouth daily for 30 days. 12/29/18 01/28/19  Thurnell Lose, MD  fluticasone (FLONASE) 50 MCG/ACT nasal spray Place 2 sprays into both nostrils daily.    [provider]  glucose blood (ACCU-CHEK AVIVA) test strip Use twice per day. 10/29/18   Isabelle Course, MD  HYDROmorphone (DILAUDID) 2 MG tablet Take 2 mg by mouth every 4 (four) hours as needed for severe pain.    [provider]  insulin aspart (NOVOLOG) 100 UNIT/ML injection Inject 5 Units into the skin 3 (three) times daily with meals. Patient taking differently: Inject 0-9 Units into the skin 3 (three) times daily with meals.  10/13/18   Alphonzo Grieve, MD  insulin glargine (LANTUS) 100 UNIT/ML injection Inject 0.03 mLs (3 Units total) into the skin at bedtime. Patient taking differently: Inject 5 Units into the skin at bedtime.  10/29/18   Isabelle Course, MD  Insulin Syringe-Needle U-100 (INSULIN SYRINGE .3CC/31GX5/16") 31G X 5/16" 0.3 ML MISC Inject 1 each into the skin 4 (four) times daily. 06/27/18   Clent Demark, PA-C  Lancets (ACCU-CHEK SOFT [facility]) lancets Use twice per day. 10/29/18    Isabelle Course, MD  lidocaine-prilocaine (EMLA) cream Apply 1 application topically See admin instructions. Apply small amount to access site (AVF)before dialysis. Cover with occlusive dressing (Saran wrap) 12/29/18   [provider]  pantoprazole (PROTONIX) 40 MG tablet Take 1 tablet (40 mg total) by mouth daily. 11/21/18   Ghimire, Henreitta Leber, MD  polyethylene glycol (MIRALAX / GLYCOLAX) 17 g packet Take 17 g by mouth daily. 12/29/18   Thurnell Lose, MD  sevelamer carbonate (RENVELA) 800 MG tablet Take 2 tablets (1,600 mg total) by mouth 3 (three) times daily with meals. 11/26/18   Bonnell Public, MD    Social History   Socioeconomic History   Marital status: Single    Spouse name: Not on file   Number of children: Not on file   Years of education: Not on file   Highest education level: Not on file  Occupational History   Not on file  Social  Needs   Financial resource strain: Not on file   Food insecurity:    Worry: Not on file    Inability: Not on file   Transportation needs:    Medical: Not on file    Non-medical: Not on file  Tobacco Use   Smoking status: Current Every Day Smoker    Packs/day: 0.50    Years: 48.00    Pack years: 24.00    Types: Cigarettes   Smokeless tobacco: Never Used   Tobacco comment: trying to quit ; smoking 1 1/2 cigarettes/day  (09/22/2018)  Substance and Sexual Activity   Alcohol use: Not Currently    Comment: 09/22/2018 "stopped about 6 months ago"   Drug use: Yes    Types: Cocaine, Heroin    Comment: See notes   Sexual activity: Not Currently    Birth control/protection: None  Lifestyle   Physical activity:    Days per week: Not on file    Minutes per session: Not on file   Stress: Not on file  Relationships   Social connections:    Talks on phone: Not on file    Gets together: Not on file    Attends religious service: Not on file    Active member of club or organization: Not on file    Attends meetings of  clubs or organizations: Not on file    Relationship status: Not on file   Intimate partner violence:    Fear of current or ex partner: Not on file    Emotionally abused: Not on file    Physically abused: Not on file    Forced sexual activity: Not on file  Other Topics Concern   Not on file  Social History Narrative   Per notes, Pt lives with her son.     Family History  Problem Relation Age of Onset   Autoimmune disease Neg Hx     ROS: Otherwise negative unless mentioned in HPI  Physical Examination  Vitals:   01/15/19 0313 01/15/19 1047  BP:  118/83  Pulse: (!) 101 90  Resp: 19 14  Temp:  97.8 F (36.6 C)  SpO2: 96% 98%   Body mass index is 23.79 kg/m.  General:  WDWN in NAD Gait: Not observed HENT: WNL, normocephalic Pulmonary: normal non-labored breathing on O2 by nasal cannula Cardiac: regular Abdomen: soft, NT/ND, no masses Skin: without rashes Vascular Exam/Pulses: Symmetrical radial pulses Extremities: without ischemic changes, without Gangrene , without cellulitis; without open wounds;  Musculoskeletal: no muscle wasting or atrophy  Neurologic: A&O X 3;  No focal weakness or paresthesias are detected; speech is fluent/normal Psychiatric:  The pt has Normal affect. Lymph:  Unremarkable  CBC    Component Value Date/Time   WBC 8.3 01/14/2019 0738   RBC 3.17 (L) 01/14/2019 0738   HGB 7.4 (L) 01/14/2019 0738   HGB 8.0 (L) 03/27/2018 1107   HCT 23.3 (L) 01/14/2019 0738   HCT 25.3 (L) 03/27/2018 1107   PLT 227 01/14/2019 0738   PLT 126 (L) 03/27/2018 1107   MCV 73.5 (L) 01/14/2019 0738   MCV 83 03/27/2018 1107   MCH 23.3 (L) 01/14/2019 0738   MCHC 31.8 01/14/2019 0738   RDW 21.5 (H) 01/14/2019 0738   RDW 19.9 (H) 03/27/2018 1107   LYMPHSABS 1.5 01/11/2019 1755   LYMPHSABS 0.5 (L) 03/27/2018 1107   MONOABS 0.9 01/11/2019 1755   EOSABS 0.1 01/11/2019 1755   EOSABS 0.0 03/27/2018 1107   BASOSABS 0.0 01/11/2019 1755  BASOSABS 0.0 03/27/2018  1107    BMET    Component Value Date/Time   NA 133 (L) 01/14/2019 2221   NA 135 03/25/2018 1014   K 4.3 01/14/2019 2221   CL 97 (L) 01/14/2019 2221   CO2 26 01/14/2019 2221   GLUCOSE 202 (H) 01/14/2019 2221   BUN 22 01/14/2019 2221   BUN 96 (HH) 03/25/2018 1014   CREATININE 5.25 (H) 01/14/2019 2221   CALCIUM 7.6 (L) 01/14/2019 2221   CALCIUM 8.3 (L) 09/23/2018 1329   GFRNONAA 8 (L) 01/14/2019 2221   GFRAA 9 (L) 01/14/2019 2221    COAGS: Lab Results  Component Value Date   INR 1.0 01/14/2019   INR 1.2 01/11/2019   INR 0.94 10/27/2018     Non-Invasive Vascular Imaging:   Vein mapping performed 07/2018 demonstrates marginal right cephalic and basilic vein for dialysis conduit use   ASSESSMENT/PLAN: This is a 63 y.o. female with end-stage renal disease on hemodialysis.  - Left arm AV graft now thrombosed after recent revision last month - Plan will be to proceed with right arm dialysis access - Right arm IV will have to be moved; restrict right arm - She will be scheduled for catheter exchange and right arm AV fistula versus graft for the next available surgery day - On-call vascular surgeon Dr. Donzetta Matters will evaluate the patient later today and provide further treatment plan  Dagoberto Ligas PA-C [facility] [phone]  I have independently interviewed and examined patient and agree with PA assessment and plan above.  Left upper extremity accesses failed.  Currently has temporary catheter.  We will plan for conversion to tunneled dialysis catheter and right upper extremity access on Monday.  N.p.o. past midnight on Sunday night.  Please restrict right arm.  Tandy Lewin C. Donzetta Matters, MD [facility] [place] [facility] Office: [phone] Pager: [phone]

## 2019-01-15 NOTE — Progress Notes (Signed)
PROGRESS NOTE  JEANINNE LODICO DPO:[id number] DOB: Dec 18, 1955 DOA: 01/11/2019 PCP: Clent Demark, PA-C   LOS: 3 days   Brief narrative:  Rhonda Lynch is a 63 y.o. female with medical history significant of ESRD-HD (TTS), DM, HTN, stroke, GERD, HCV, sCHF, polysubstance abuse (tobacco, alcohol, heroin, cocaine), chronic elevation of troponin, presented to hospital with chest pain and shortness of breath. ED Course: pt was found to have Negative COVID-19 test, D-dimer 9.63, troponin 0.06, WBC 9.2 (CBC showed shcistocytes, pending smear) potassium 5.4, bicarbonate 18, creatinine 16.9, BUN 116, temperature normal, tachycardia, tachypnea, oxygen saturation 70s at RA per EMS-->100 on 4L nasal cannula oxygen, chest x-ray showed cardiomegaly and pulmonary edema.      Subjective:  Patient seen during hemodialysis.  She still complains of mild left arm pain.  Denies any fever, chills or rigors.  No shortness of breath or chest pain.  Wishes to go home.  Assessment/Plan:  Principal Problem:   Acute pulmonary edema (HCC) Active Problems:   TOBACCO ABUSE   Alcohol abuse   Cocaine abuse (HCC)   Elevated troponin   Hypertension   Noncompliance   Acute respiratory failure with hypoxia (HCC)   Insulin dependent diabetes mellitus (HCC)   Chest pain   End-stage renal disease on hemodialysis (HCC)   Hyperkalemia   Acute on chronic systolic CHF (congestive heart failure) (HCC)   Fluid overload   Acute metabolic encephalopathy  Chest pain -improved  Atypical in nature.  CT angiogram was negative for pulmonary embolism or dissection.  Troponins are mildly elevated but flat.  Chronic elevation of troponins.  Echocardiogram was negative for acute findings.  Continue PPI for esophagitis, gastritis.   Left arm pain.  Thrombosed AV graft. Seen by nephrology.  Vascular surgery has been consulted for further management.  Status post  right external jugular temporary dialysis catheter placement on  01/14/2019.  Acute hypoxic respiratory failure/acute on chronic combined chf due to missing dialysis and volume overload. -cxr on presentation with pulmonary edema.    Status post  urgent dialysis with 3liters removal on admission.  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  Currently improving, saturating well on 2 L of nasal cannula.  Acute metabolic encephalopathy, on presentation Likely secondary to intoxication from cocaine/uremia and hypoxia.  Improving.   Nutrition.  Dysphagia III diet/thin liquid   ESRD TTS Noncompliance, missed HD for a week prior to admission.  Nephrology on board.  Continue hemodialysis during hospitalization.  Essential HTN:  On schedule labetalol and clonidine patch.  Diabetes mellitus type 2. Continue diabetic diet, sliding scale insulin. Will closely monitor.  Microcytic anemia Stable at this time.  Polysubstance abuse Long history of  cocaine use.  Supportive care.  No signs of withdrawal noted  VTE Prophylaxis: Heparin subcu  Code Status: Full code  Family Communication: I spoke with the patient's son on the phone and updated him about the clinical condition of the patient.   Disposition Plan: Home, await vascular surgery opinion.   Consultants:  Nephrology, critical care  Procedures:  BiPAP  Dialysis  Right external jugular temporary dialysis catheter placement on 01/14/2019.  Antibiotics: Anti-infectives (From admission, onward)   None      Objective: Vitals:   01/15/19 1430 01/15/19 1500  BP: 119/86 104/70  Pulse: 100 (!) 105  Resp:    Temp:    SpO2:      Intake/Output Summary (Last  24 hours) at 01/15/2019 1518 Last data filed at 01/14/2019 2038 Gross per 24 hour  Intake -  Output 2500 ml  Net -2500 ml   Filed Weights   01/14/19 1654 01/14/19 2038 01/15/19 1210  Weight: 59.2 kg 57.1 kg 57.1 kg   Body mass  index is 23.79 kg/m.   Physical Exam: GENERAL: Patient is alert awake and oriented. Not in obvious distress.  Chronically ill on nasal cannula oxygen HENT: No scleral pallor or icterus. Pupils equally reactive to light. Oral mucosa is moist NECK: is supple, no palpable thyroid enlargement. CHEST: Diminished breath sounds bilaterally.  Right EJ temporary hemodialysis catheter CVS: S1 and S2 heard, no murmur. Regular rate and rhythm. No pericardial rub. ABDOMEN: Soft, non-tender, bowel sounds are present. No palpable hepato-splenomegaly. EXTREMITIES: Trace edema, left upper extremity AV graft with tenderness CNS: Cranial nerves are intact. No focal motor or sensory deficits. SKIN: warm and dry without rashes.  Data Review: I have personally reviewed the following laboratory data and studies,  CBC: Recent Labs  Lab 01/11/19 1755 01/11/19 1800 01/12/19 0828 01/13/19 0208 01/14/19 0738 01/15/19 1204  WBC 9.2  --  9.0 7.2 8.3 7.6  NEUTROABS 6.7  --   --   --   --   --   HGB 9.0* 10.5* 9.2* 8.4* 7.4* 7.9*  HCT 29.3* 31.0* 29.2* 26.5* 23.3* 25.7*  MCV 77.3*  --  75.6* 76.4* 73.5* 76.0*  PLT 310  --  274 262 227 017   Basic Metabolic Panel: Recent Labs  Lab 01/11/19 1755 01/11/19 1800 01/12/19 0828 01/14/19 0738 01/14/19 2221 01/15/19 1204  NA 142 141 139 136 133* 135  K 5.5* 5.4* 4.5 6.2* 4.3 4.9  CL 103  --  101 100 97* 94*  CO2 18*  --  23 26 26 31   GLUCOSE 126*  --  90 110* 202* 145*  BUN 116*  --  51* 82* 22 33*  CREATININE 16.93*  --  10.01* 12.82* 5.25* 6.99*  CALCIUM 7.3*  --  7.9* 7.7* 7.6* 7.9*  PHOS  --   --   --   --   --  4.9*   Liver Function Tests: Recent Labs  Lab 01/15/19 1204  ALBUMIN 2.1*   No results for input(s): LIPASE, AMYLASE in the last 168 hours. No results for input(s): AMMONIA in the last 168 hours. Cardiac Enzymes: Recent Labs  Lab 01/11/19 1755 01/11/19 2043 01/12/19 0242 01/12/19 0828  TROPONINI 0.06* 0.07* 0.07* 0.05*   BNP  (last 3 results) Recent Labs    02/12/18 1534 06/20/18 2042 12/03/18 1209  BNP 225.7* >4,500.0* >4,500.0*    ProBNP (last 3 results) No results for input(s): PROBNP in the last 8760 hours.  CBG: Recent Labs  Lab 01/14/19 1117 01/14/19 1523 01/14/19 2132 01/15/19 0621 01/15/19 1045  GLUCAP 114* 107* 584* 127* 117*   Recent Results (from the past 240 hour(s))  SARS Coronavirus 2 (CEPHEID- Performed in [place] hospital lab), Hosp Order     Status: None   Collection Time: 01/11/19  7:57 PM  Result Value Ref Range Status   SARS Coronavirus 2 NEGATIVE NEGATIVE Final    Comment: (NOTE) If result is NEGATIVE SARS-CoV-2 target nucleic acids are NOT DETECTED. The SARS-CoV-2 RNA is generally detectable in upper and lower  respiratory specimens during the acute phase of infection. The lowest  concentration of SARS-CoV-2 viral copies this assay can detect is 250  copies / mL. A negative result does not preclude  SARS-CoV-2 infection  and should not be used as the sole basis for treatment or other  patient management decisions.  A negative result may occur with  improper specimen collection / handling, submission of specimen other  than nasopharyngeal swab, presence of viral mutation(s) within the  areas targeted by this assay, and inadequate number of viral copies  (<250 copies / mL). A negative result must be combined with clinical  observations, patient history, and epidemiological information. If result is POSITIVE SARS-CoV-2 target nucleic acids are DETECTED. The SARS-CoV-2 RNA is generally detectable in upper and lower  respiratory specimens dur ing the acute phase of infection.  Positive  results are indicative of active infection with SARS-CoV-2.  Clinical  correlation with patient history and other diagnostic information is  necessary to determine patient infection status.  Positive results do  not rule out bacterial infection or co-infection with other viruses. If  result is PRESUMPTIVE POSTIVE SARS-CoV-2 nucleic acids MAY BE PRESENT.   A presumptive positive result was obtained on the submitted specimen  and confirmed on repeat testing.  While 2019 novel coronavirus  (SARS-CoV-2) nucleic acids may be present in the submitted sample  additional confirmatory testing may be necessary for epidemiological  and / or clinical management purposes  to differentiate between  SARS-CoV-2 and other Sarbecovirus currently known to infect humans.  If clinically indicated additional testing with an alternate test  methodology [phone]) is advised. The SARS-CoV-2 RNA is generally  detectable in upper and lower respiratory sp ecimens during the acute  phase of infection. The expected result is Negative. Fact Sheet for Patients:  StrictlyIdeas.no Fact Sheet for Healthcare Providers: BankingDealers.co.za This test is not yet approved or cleared by the [place] FDA and has been authorized for detection and/or diagnosis of SARS-CoV-2 by FDA under an Emergency Use Authorization (EUA).  This EUA will remain in effect (meaning this test can be used) for the duration of the COVID-19 declaration under Section 564(b)(1) of the Act, 21 U.S.C. section 360bbb-3(b)(1), unless the authorization is terminated or revoked sooner. Performed at [facility], [place] [address], [place], [place] [address]      Studies: Ir Cyndy Freeze Guide Cv Line Right  Result Date: 01/14/2019 INDICATION: End-stage renal disease, occluded access, hyperkalemia EXAM: ULTRASOUND FLUOROSCOPIC RIGHT EJ TEMPORARY DIALYSIS CATHETER [facility] CATHETER) MEDICATIONS: 1% lidocaine local ANESTHESIA/SEDATION: Moderate Sedation Time: None. [name] level of consciousness and vital signs were monitored continuously by radiology nursing throughout the procedure under my direct supervision. FLUOROSCOPY TIME:  Fluoroscopy Time: 1 minutes 6 seconds (3 mGy).  COMPLICATIONS: None immediate. PROCEDURE: Informed written consent was obtained from the patient after a thorough discussion of the procedural risks, benefits and alternatives. All questions were addressed. Maximal Sterile Barrier Technique was utilized including caps, mask, sterile gowns, sterile gloves, sterile drape, hand hygiene and skin antiseptic. A timeout was performed prior to the initiation of the procedure. Under sterile conditions and local anesthesia, initially a tensor made to access the right internal jugular vein however this was unsuccessful hepatic and guidewire centrally. There is an adjacent dilated right external jugular vein. In a similar fashion, ultrasound micropuncture access performed of the right external jugular vein. Guidewire advanced centrally into the IVC. Micro dilator advanced. Measurements obtained for the appropriate length. Over the Amplatz guidewire, dilatation performed to insert a 20 cm temporary dialysis catheter. Tip position in the proximal right atrium. Access ready for use. Blood aspirated easily followed by saline and heparin flushes. External caps applied. Catheter secured  with Ethilon sutures. Sterile dressing placed over the site. Patient tolerated the procedure well. No immediate complication. IMPRESSION: Successful ultrasound right external jugular 20 cm temporary dialysis catheter as above. Ready for use. Electronically Signed   By: Jerilynn Mages.  Shick M.D.   On: 01/14/2019 15:22   Ir US Guide Vasc Access Right  Result Date: 01/14/2019 INDICATION: End-stage renal disease, occluded access, hyperkalemia EXAM: ULTRASOUND FLUOROSCOPIC RIGHT EJ TEMPORARY DIALYSIS CATHETER [facility] CATHETER) MEDICATIONS: 1% lidocaine local ANESTHESIA/SEDATION: Moderate Sedation Time: None. [name] level of consciousness and vital signs were monitored continuously by radiology nursing throughout the procedure under my direct supervision. FLUOROSCOPY TIME:  Fluoroscopy Time: 1 minutes 6  seconds (3 mGy). COMPLICATIONS: None immediate. PROCEDURE: Informed written consent was obtained from the patient after a thorough discussion of the procedural risks, benefits and alternatives. All questions were addressed. Maximal Sterile Barrier Technique was utilized including caps, mask, sterile gowns, sterile gloves, sterile drape, hand hygiene and skin antiseptic. A timeout was performed prior to the initiation of the procedure. Under sterile conditions and local anesthesia, initially a tensor made to access the right internal jugular vein however this was unsuccessful hepatic and guidewire centrally. There is an adjacent dilated right external jugular vein. In a similar fashion, ultrasound micropuncture access performed of the right external jugular vein. Guidewire advanced centrally into the IVC. Micro dilator advanced. Measurements obtained for the appropriate length. Over the Amplatz guidewire, dilatation performed to insert a 20 cm temporary dialysis catheter. Tip position in the proximal right atrium. Access ready for use. Blood aspirated easily followed by saline and heparin flushes. External caps applied. Catheter secured with Ethilon sutures. Sterile dressing placed over the site. Patient tolerated the procedure well. No immediate complication. IMPRESSION: Successful ultrasound right external jugular 20 cm temporary dialysis catheter as above. Ready for use. Electronically Signed   By: Jerilynn Mages.  Shick M.D.   On: 01/14/2019 15:22    Scheduled Meds: . aspirin EC  81 mg Oral Daily  . Chlorhexidine Gluconate Cloth  6 each Topical Q0600  . cloNIDine  0.1 mg Transdermal Weekly  . doxercalciferol  5 mcg Intravenous Q T,Th,Sa-HD  . feeding supplement (NEPRO CARB STEADY)  237 mL Oral BID BM  . fluticasone  2 spray Each Nare Daily  . folic acid  1 mg Oral Daily  . gabapentin  100 mg Oral TID  . guaiFENesin  600 mg Oral BID  . heparin      . insulin aspart  0-9 Units Subcutaneous TID WC  . multivitamin  with minerals  1 tablet Oral Daily  . nicotine  21 mg Transdermal Daily  . senna-docusate  1 tablet Oral BID  . sevelamer carbonate  1,600 mg Oral TID WC  . thiamine  100 mg Oral Daily   Or  . thiamine  100 mg Intravenous Daily    Continuous Infusions: . ferric gluconate (FERRLECIT/NULECIT) IV 125 mg (01/15/19 1443)     Flora Lipps, MD  [facility] 01/15/2019

## 2019-01-15 NOTE — Progress Notes (Signed)
PT Cancellation Note  Patient Details Name: Rhonda Lynch MRN: [id number] DOB: 08/24/1956   Cancelled Treatment:    Reason Eval/Treat Not Completed: Patient at procedure or test/unavailable(Pt going to HD.  Will check back tomorrow.  )   Denice Paradise 01/15/2019, 11:56 AM  [facility] Pager:  [phone]  Office:  [phone]

## 2019-01-15 NOTE — Progress Notes (Signed)
  Speech Language Pathology Treatment: Dysphagia  Patient Details Name: Rhonda Lynch MRN: [id number] DOB: 08/12/1956 Today's Date: 01/15/2019 Time: 2992-4268 SLP Time Calculation (min) (ACUTE ONLY): 16 min  Assessment / Plan / Recommendation Clinical Impression  Pt was repositioned for lunch meal, also requiring Min cues to maintain alertness especially when food was in her mouth. Min cues were also provided to take smaller bites, as she tends to put large amounts of food in her mouth in a time. She had a baseline, congested cough, observed x2 during intake as well, but not necessarily immediately following swallowing. She remains afebrile and her lung sounds are clear but diminished - unchanged. Would continue current diet for now with use of precaution particularly given her fluctuating mentation. SLP f/u still warranted for tolerance assessment.   HPI HPI: Pt is a 63 y.o. female who was admitted 5/3 with SOB and chest pain felt to be due to acute pulmonary edema in the setting of dialysis non-compliance. 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),  DM, HTN, stroke, polysubstance abuse (tobacco, heroin, alcohol, cocaine), HCV      SLP Plan  Continue with current plan of care       Recommendations  Diet recommendations: Dysphagia 3 (mechanical soft);Thin liquid Liquids provided via: Cup;Straw Medication Administration: Whole meds with puree Supervision: Patient able to self feed;Full supervision/cueing for compensatory strategies Compensations: Slow rate;Small sips/bites Postural Changes and/or Swallow Maneuvers: Seated upright 90 degrees                Oral Care Recommendations: Oral care BID Follow up Recommendations: (tba) SLP Visit Diagnosis: Dysphagia, unspecified (R13.10) Plan: Continue with current plan of care       [place] Thaddaeus Granja 01/15/2019, 11:43  AM  [name], M.A. [place] Acute Environmental education officer [phone] Office [phone]

## 2019-01-16 DIAGNOSIS — I10 Essential (primary) hypertension: Secondary | ICD-10-CM

## 2019-01-16 LAB — GLUCOSE, CAPILLARY
Glucose-Capillary: 159 mg/dL — ABNORMAL HIGH (ref 70–99)
Glucose-Capillary: 114 mg/dL — ABNORMAL HIGH (ref 70–99)
Glucose-Capillary: 114 mg/dL — ABNORMAL HIGH (ref 70–99)
Glucose-Capillary: 123 mg/dL — ABNORMAL HIGH (ref 70–99)

## 2019-01-16 MED ORDER — AMLODIPINE BESYLATE 5 MG PO TABS
5.0000 mg | ORAL_TABLET | Freq: Every day | ORAL | Status: DC
  Administered 2019-01-16 – 2019-01-20 (×5): 5 mg via ORAL
  Filled 2019-01-16 (×5): qty 1

## 2019-01-16 MED ORDER — DARBEPOETIN ALFA 150 MCG/0.3ML IJ SOSY
150.0000 ug | PREFILLED_SYRINGE | INTRAMUSCULAR | Status: DC
  Filled 2019-01-16: qty 0.3

## 2019-01-16 MED ORDER — SIMETHICONE 80 MG PO CHEW
80.0000 mg | CHEWABLE_TABLET | Freq: Four times a day (QID) | ORAL | Status: DC | PRN
  Administered 2019-01-16 – 2019-01-18 (×5): 80 mg via ORAL
  Filled 2019-01-16 (×5): qty 1

## 2019-01-16 MED ORDER — HYDROCORTISONE 1 % EX CREA
1.0000 "application " | TOPICAL_CREAM | Freq: Two times a day (BID) | CUTANEOUS | Status: DC
  Administered 2019-01-16 – 2019-01-19 (×5): 1 via TOPICAL
  Filled 2019-01-16: qty 28

## 2019-01-16 MED ORDER — CEFAZOLIN SODIUM-DEXTROSE 1-4 GM/50ML-% IV SOLN
1.0000 g | INTRAVENOUS | Status: AC
  Administered 2019-01-19: 08:00:00 1 g via INTRAVENOUS
  Filled 2019-01-16 (×2): qty 50

## 2019-01-16 MED ORDER — PANTOPRAZOLE SODIUM 40 MG PO TBEC
40.0000 mg | DELAYED_RELEASE_TABLET | Freq: Every day | ORAL | Status: DC
  Administered 2019-01-16 – 2019-01-20 (×5): 40 mg via ORAL
  Filled 2019-01-16 (×5): qty 1

## 2019-01-16 MED ORDER — TRAZODONE HCL 50 MG PO TABS
50.0000 mg | ORAL_TABLET | Freq: Once | ORAL | Status: AC
  Administered 2019-01-16: 50 mg via ORAL
  Filled 2019-01-16: qty 1

## 2019-01-16 NOTE — Progress Notes (Signed)
PROGRESS NOTE  Rhonda Lynch TDV:[id number] DOB: April 10, 1956 DOA: 01/11/2019 PCP: Clent Demark, PA-C   LOS: 4 days   Brief narrative:  Rhonda Lynch is a 63 y.o. female with medical history significant of ESRD-HD (TTS), DM, HTN, stroke, GERD, HCV, sCHF, polysubstance abuse (tobacco, alcohol, heroin, cocaine), chronic elevation of troponin, presented to hospital with chest pain and shortness of breath. ED Course: pt was found to have Negative COVID-19 test, D-dimer 9.63, troponin 0.06, WBC 9.2 (CBC showed shcistocytes, pending smear) potassium 5.4, bicarbonate 18, creatinine 16.9, BUN 116, temperature normal, tachycardia, tachypnea, oxygen saturation 70s at RA per EMS-->100 on 4L nasal cannula oxygen, chest x-ray showed cardiomegaly and pulmonary edema.      Subjective: Patient sleeping at bedside.  Denies any interval complaints for mild left arm discomfort.  Denies shortness of breath, cough or chest pain.  Assessment/Plan:  Principal Problem:   Acute pulmonary edema (HCC) Active Problems:   TOBACCO ABUSE   Alcohol abuse   Cocaine abuse (HCC)   Elevated troponin   Hypertension   Noncompliance   Acute respiratory failure with hypoxia (HCC)   Insulin dependent diabetes mellitus (HCC)   Chest pain   End-stage renal disease on hemodialysis (HCC)   Hyperkalemia   Acute on chronic systolic CHF (congestive heart failure) (HCC)   Fluid overload   Acute metabolic encephalopathy  Chest pain -improved.  Atypical in nature.  CT angiogram was negative for pulmonary embolism or dissection.  Troponins are mildly elevated but flat.  Chronic elevation of troponins.  Echocardiogram was negative for acute findings.  Continue PPI for esophagitis, gastritis.   Left arm pain.  Thrombosed AV graft. Seen by vascular surgery.  Status post  right external jugular temporary dialysis catheter placement on 01/14/2019.  Plan is for hemodialysis catheter placement on Monday.  Acute hypoxic  respiratory failure/acute on chronic combined chf due to missing dialysis and volume overload. X-ray showed pulmonary edema on presentation.  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  Currently oximetry is 92% on room air.  Acute metabolic encephalopathy, on presentation Likely secondary to intoxication from cocaine/uremia and hypoxia.  Resolved.  Nutrition.  Dysphagia III diet/thin liquid   ESRD TTS Noncompliance, missed HD for a week prior to admission.  Nephrology on board.  Continue hemodialysis during hospitalization.  Essential HTN:  On  clonidine patch. Resume amlodipine from home.  Will closely monitor.  Diabetes mellitus type 2. Continue diabetic diet, sliding scale insulin. Will closely monitor.  Microcytic anemia Stable at this time.  Polysubstance abuse Long history of  cocaine use.  Supportive care.  No signs of withdrawal  VTE Prophylaxis: Heparin subcu  Code Status: Full code  Family Communication: None today  Disposition Plan: Home, awaiting for vascular surgery intervention on Monday.  Consultants:  Nephrology, critical care, vascular surgery  Procedures:  BiPAP  Dialysis  Right external jugular temporary dialysis catheter placement on 01/14/2019.  Antibiotics: Anti-infectives (From admission, onward)   Start     Dose/Rate Route Frequency Ordered Stop   01/19/19 0000  ceFAZolin (ANCEF) IVPB 1 g/50 mL premix    Note to Pharmacy:  Send with pt to OR   1 g 100 mL/hr over 30 Minutes Intravenous On call 01/16/19 1021 01/20/19 0000      Objective: Vitals:   01/16/19 0739 01/16/19 1030  BP: 129/78 124/86  Pulse:  97  Resp:  13  Temp: 98.3 F (36.8 C) 98.6 F (37 C)  SpO2:  92%    Intake/Output Summary (Last 24 hours) at 01/16/2019 1117 Last data filed at 01/16/2019 1031 Gross per 24 hour  Intake 600 ml  Output 3000 ml  Net -2400 ml   Filed Weights   01/14/19 2038 01/15/19 1210 01/15/19 1550  Weight: 57.1  kg 57.1 kg 53.9 kg   Body mass index is 22.45 kg/m.   Physical Exam: General: Average built.  Not in obvious distress.  Chronically ill.  Alert awake oriented. HENT: Normocephalic, pupils equally reacting to light and accommodation.  No scleral pallor or icterus noted. Oral mucosa is moist.  Chest:  Clear breath sounds.  Diminished breath sounds bilaterally. No crackles or wheezes.Right EJ temporary hemodialysis catheter  CVS: S1 &S2 heard. No murmur.  Regular rate and rhythm. Abdomen: Soft, nontender, nondistended.  Bowel sounds are heard.  Liver is not palpable, no abdominal mass palpated Extremities: No cyanosis, clubbing.  Peripheral pulses are palpable.  Left upper extremity AV graft with tenderness Psych: Alert, awake and oriented, normal mood CNS:  No cranial nerve deficits.  Power equal in all extremities.  No sensory deficits noted.  No cerebellar signs.   Skin: Warm and dry.  No rashes noted.  Data Review: I have personally reviewed the following laboratory data and studies,  CBC: Recent Labs  Lab 01/11/19 1755 01/11/19 1800 01/12/19 0828 01/13/19 0208 01/14/19 0738 01/15/19 1204  WBC 9.2  --  9.0 7.2 8.3 7.6  NEUTROABS 6.7  --   --   --   --   --   HGB 9.0* 10.5* 9.2* 8.4* 7.4* 7.9*  HCT 29.3* 31.0* 29.2* 26.5* 23.3* 25.7*  MCV 77.3*  --  75.6* 76.4* 73.5* 76.0*  PLT 310  --  274 262 227 161   Basic Metabolic Panel: Recent Labs  Lab 01/11/19 1755 01/11/19 1800 01/12/19 0828 01/14/19 0738 01/14/19 2221 01/15/19 1204  NA 142 141 139 136 133* 135  K 5.5* 5.4* 4.5 6.2* 4.3 4.9  CL 103  --  101 100 97* 94*  CO2 18*  --  23 26 26 31   GLUCOSE 126*  --  90 110* 202* 145*  BUN 116*  --  51* 82* 22 33*  CREATININE 16.93*  --  10.01* 12.82* 5.25* 6.99*  CALCIUM 7.3*  --  7.9* 7.7* 7.6* 7.9*  PHOS  --   --   --   --   --  4.9*   Liver Function Tests: Recent Labs  Lab 01/15/19 1204  ALBUMIN 2.1*   No results for input(s): LIPASE, AMYLASE in the last 168 hours.  No results for input(s): AMMONIA in the last 168 hours. Cardiac Enzymes: Recent Labs  Lab 01/11/19 1755 01/11/19 2043 01/12/19 0242 01/12/19 0828  TROPONINI 0.06* 0.07* 0.07* 0.05*   BNP (last 3 results) Recent Labs    02/12/18 1534 06/20/18 2042 12/03/18 1209  BNP 225.7* >4,500.0* >4,500.0*    ProBNP (last 3 results) No results for input(s): PROBNP in the last 8760 hours.  CBG: Recent Labs  Lab 01/15/19 0621 01/15/19 1045 01/15/19 1723 01/15/19 2125 01/16/19 0637  GLUCAP 127* 117* 232* 124* 159*   Recent Results (from the past 240 hour(s))  SARS Coronavirus 2 (CEPHEID- Performed in [place] hospital lab), Hosp Order     Status: None   Collection Time: 01/11/19  7:57 PM  Result Value Ref Range Status   SARS Coronavirus 2 NEGATIVE NEGATIVE Final    Comment: (NOTE) If result is NEGATIVE SARS-CoV-2 target nucleic acids are  NOT DETECTED. The SARS-CoV-2 RNA is generally detectable in upper and lower  respiratory specimens during the acute phase of infection. The lowest  concentration of SARS-CoV-2 viral copies this assay can detect is 250  copies / mL. A negative result does not preclude SARS-CoV-2 infection  and should not be used as the sole basis for treatment or other  patient management decisions.  A negative result may occur with  improper specimen collection / handling, submission of specimen other  than nasopharyngeal swab, presence of viral mutation(s) within the  areas targeted by this assay, and inadequate number of viral copies  (<250 copies / mL). A negative result must be combined with clinical  observations, patient history, and epidemiological information. If result is POSITIVE SARS-CoV-2 target nucleic acids are DETECTED. The SARS-CoV-2 RNA is generally detectable in upper and lower  respiratory specimens dur ing the acute phase of infection.  Positive  results are indicative of active infection with SARS-CoV-2.  Clinical  correlation with  patient history and other diagnostic information is  necessary to determine patient infection status.  Positive results do  not rule out bacterial infection or co-infection with other viruses. If result is PRESUMPTIVE POSTIVE SARS-CoV-2 nucleic acids MAY BE PRESENT.   A presumptive positive result was obtained on the submitted specimen  and confirmed on repeat testing.  While 2019 novel coronavirus  (SARS-CoV-2) nucleic acids may be present in the submitted sample  additional confirmatory testing may be necessary for epidemiological  and / or clinical management purposes  to differentiate between  SARS-CoV-2 and other Sarbecovirus currently known to infect humans.  If clinically indicated additional testing with an alternate test  methodology [phone]) is advised. The SARS-CoV-2 RNA is generally  detectable in upper and lower respiratory sp ecimens during the acute  phase of infection. The expected result is Negative. Fact Sheet for Patients:  StrictlyIdeas.no Fact Sheet for Healthcare Providers: BankingDealers.co.za This test is not yet approved or cleared by the [place] FDA and has been authorized for detection and/or diagnosis of SARS-CoV-2 by FDA under an Emergency Use Authorization (EUA).  This EUA will remain in effect (meaning this test can be used) for the duration of the COVID-19 declaration under Section 564(b)(1) of the Act, 21 U.S.C. section 360bbb-3(b)(1), unless the authorization is terminated or revoked sooner. Performed at [facility], [place] [address], [place], [place] [address]      Studies: Ir Cyndy Freeze Guide Cv Line Right  Result Date: 01/14/2019 INDICATION: End-stage renal disease, occluded access, hyperkalemia EXAM: ULTRASOUND FLUOROSCOPIC RIGHT EJ TEMPORARY DIALYSIS CATHETER [facility] CATHETER) MEDICATIONS: 1% lidocaine local ANESTHESIA/SEDATION: Moderate Sedation Time: None. [name] level of  consciousness and vital signs were monitored continuously by radiology nursing throughout the procedure under my direct supervision. FLUOROSCOPY TIME:  Fluoroscopy Time: 1 minutes 6 seconds (3 mGy). COMPLICATIONS: None immediate. PROCEDURE: Informed written consent was obtained from the patient after a thorough discussion of the procedural risks, benefits and alternatives. All questions were addressed. Maximal Sterile Barrier Technique was utilized including caps, mask, sterile gowns, sterile gloves, sterile drape, hand hygiene and skin antiseptic. A timeout was performed prior to the initiation of the procedure. Under sterile conditions and local anesthesia, initially a tensor made to access the right internal jugular vein however this was unsuccessful hepatic and guidewire centrally. There is an adjacent dilated right external jugular vein. In a similar fashion, ultrasound micropuncture access performed of the right external jugular vein. Guidewire advanced centrally into the IVC. Micro dilator advanced.  Measurements obtained for the appropriate length. Over the Amplatz guidewire, dilatation performed to insert a 20 cm temporary dialysis catheter. Tip position in the proximal right atrium. Access ready for use. Blood aspirated easily followed by saline and heparin flushes. External caps applied. Catheter secured with Ethilon sutures. Sterile dressing placed over the site. Patient tolerated the procedure well. No immediate complication. IMPRESSION: Successful ultrasound right external jugular 20 cm temporary dialysis catheter as above. Ready for use. Electronically Signed   By: Jerilynn Mages.  Shick M.D.   On: 01/14/2019 15:22   Ir US Guide Vasc Access Right  Result Date: 01/14/2019 INDICATION: End-stage renal disease, occluded access, hyperkalemia EXAM: ULTRASOUND FLUOROSCOPIC RIGHT EJ TEMPORARY DIALYSIS CATHETER [facility] CATHETER) MEDICATIONS: 1% lidocaine local ANESTHESIA/SEDATION: Moderate Sedation Time: None. The  patient's level of consciousness and vital signs were monitored continuously by radiology nursing throughout the procedure under my direct supervision. FLUOROSCOPY TIME:  Fluoroscopy Time: 1 minutes 6 seconds (3 mGy). COMPLICATIONS: None immediate. PROCEDURE: Informed written consent was obtained from the patient after a thorough discussion of the procedural risks, benefits and alternatives. All questions were addressed. Maximal Sterile Barrier Technique was utilized including caps, mask, sterile gowns, sterile gloves, sterile drape, hand hygiene and skin antiseptic. A timeout was performed prior to the initiation of the procedure. Under sterile conditions and local anesthesia, initially a tensor made to access the right internal jugular vein however this was unsuccessful hepatic and guidewire centrally. There is an adjacent dilated right external jugular vein. In a similar fashion, ultrasound micropuncture access performed of the right external jugular vein. Guidewire advanced centrally into the IVC. Micro dilator advanced. Measurements obtained for the appropriate length. Over the Amplatz guidewire, dilatation performed to insert a 20 cm temporary dialysis catheter. Tip position in the proximal right atrium. Access ready for use. Blood aspirated easily followed by saline and heparin flushes. External caps applied. Catheter secured with Ethilon sutures. Sterile dressing placed over the site. Patient tolerated the procedure well. No immediate complication. IMPRESSION: Successful ultrasound right external jugular 20 cm temporary dialysis catheter as above. Ready for use. Electronically Signed   By: Jerilynn Mages.  Shick M.D.   On: 01/14/2019 15:22    Scheduled Meds: . aspirin EC  81 mg Oral Daily  . Chlorhexidine Gluconate Cloth  6 each Topical Q0600  . cloNIDine  0.1 mg Transdermal Weekly  . [START ON 01/17/2019] darbepoetin (ARANESP) injection - DIALYSIS  150 mcg Intravenous Q Sat-HD  . doxercalciferol  5 mcg Intravenous  Q T,Th,Sa-HD  . feeding supplement (NEPRO CARB STEADY)  237 mL Oral BID BM  . fluticasone  2 spray Each Nare Daily  . folic acid  1 mg Oral Daily  . gabapentin  100 mg Oral TID  . guaiFENesin  600 mg Oral BID  . insulin aspart  0-9 Units Subcutaneous TID WC  . multivitamin with minerals  1 tablet Oral Daily  . nicotine  21 mg Transdermal Daily  . senna-docusate  1 tablet Oral BID  . sevelamer carbonate  1,600 mg Oral TID WC  . thiamine  100 mg Oral Daily   Or  . thiamine  100 mg Intravenous Daily    Continuous Infusions: . [START ON 01/19/2019]  ceFAZolin (ANCEF) IV    . ferric gluconate (FERRLECIT/NULECIT) IV 125 mg (01/15/19 1443)     Flora Lipps, MD  [facility] 01/16/2019

## 2019-01-16 NOTE — Progress Notes (Signed)
PT Cancellation Note  Patient Details Name: Rhonda Lynch MRN: [id number] DOB: Jun 07, 1956   Cancelled Treatment:    Reason Eval/Treat Not Completed: Patient declined, no reason specified.  Pt states she is unable.  Her stomach is very sore.  Will try again 5/9 as able.    Tessie Fass Taleigha Pinson 01/16/2019, 4:19 PM

## 2019-01-16 NOTE — Progress Notes (Signed)
Pt vomitted a large amount of undigested , food , seen on the floor of room, medicated with zofran, c/o belly pain prior ,medicated with pain meds, .At time of writing @1837hrs , pt is in bed resting , no further complaints voiced.

## 2019-01-16 NOTE — Progress Notes (Addendum)
Subjective:  Had HD yest with temp hd cath tolerated , this am sleeping , and  No cos, no sob or cp  ,Noted per Dr Donzetta Matters "plan for Tunneled HD cath and New R arm access Monday am "  Objective Vital signs in last 24 hours: Vitals:   01/15/19 2030 01/15/19 2317 01/16/19 0327 01/16/19 0739  BP: 121/82 (!) 141/74 139/78 129/78  Pulse: (!) 104 (!) 104 99   Resp: 15 (!) 23 18   Temp: 99.1 F (37.3 C) 99.3 F (37.4 C) 98.4 F (36.9 C) 98.3 F (36.8 C)  TempSrc: Oral  Axillary Oral  SpO2: 100% 95% 98%   Weight:       Weight change: -2.1 kg  Physical Exam: General:Chronically ill appearing female in NAD / alert after woke from  Sleep, OX3 Heart: RRR, no m, r, g SR on monitor.  Lungs:decr bases otherwise CTA no WOB Abdomen:Active BS soft , Nt, ND Extremities:no pedal  edema Dialysis Access: Rt  EJ Temp Cath /   L UA AVGNObruit    [facility] T[facility] 4h 400/800 54kg 2/2.25 bath Hep 3000 units IV TIWLUA AVG - hectorol 5 mcg IV TIW - venofer 100 mg IV X 5 doses 1/5 given 01/03/19 - mircera 100 mg every 2 wks , (last dose 100 mcg IV 12/25/18)  Problem/Plan: 1. Volume overload/Acute respiratory failure D/T noncompliance with HD. Improved with HD. HD yest with 3 l uf tolerated , next hd tomor sat on schedule  .now at edw /?lower at dc  2. AMS-possible withdrawal, uremia.now @ baseline. Holding sedating meds. Per primary. 3. Chest pain -trop flat,EKG neg and CTA chest negative for PE (old pe) 4. ESRD on HD: TTS  Normal schedule  =missed last 3 sessions for "recurrent diarrhea."  5. Clotted LUA AVGG =Had L AVG declot w/ arterial revision on 12/11/18.Last HD was 5/4 (Mon)/Unable to HD on admit as she was thrombosed/   IR placed  Temp R IJ HD cath -, Dr Donzetta Matters consulted =plan for Tunneled HD cath and New R arm access Monday am  6. Diarrhea-H/O C diff colitis.none reported this am  Per primary 7. HTN-BP elevated, continueon norvasc, lower volume as tolerated. 8. Drug abuse - not  currently using drugsper pt 9. Smoker - trying to quit, has nicotine patch on 10. Anemia ckd - Hb 9.8>8.4>7.9  Dosing aranesp 150 x1  Ordered w/ HD 5/06.not given  Give tomor hd check iron studies  11. BMD-Ca /phos stable . Continue binders, VDRA.  12. Hep C 13. H/o CVA 14. DM = per admit team   Ernest Haber, PA-C [place] [phone] 01/16/2019,9:14 AM  LOS: 4 days   I have seen and examined this patient and agree with the plan of care. HD tolerated Thur.  VVS to place tunneled HD cath [facility] and new rt arm access Monday.  Dwana Melena, MD 01/16/2019, 10:23 AM  Labs: Basic Metabolic Panel: Recent Labs  Lab 01/14/19 0738 01/14/19 2221 01/15/19 1204  NA 136 133* 135  K 6.2* 4.3 4.9  CL 100 97* 94*  CO2 26 26 31   GLUCOSE 110* 202* 145*  BUN 82* 22 33*  CREATININE 12.82* 5.25* 6.99*  CALCIUM 7.7* 7.6* 7.9*  PHOS  --   --  4.9*   Liver Function Tests: Recent Labs  Lab 01/15/19 1204  ALBUMIN 2.1*   No results for input(s): LIPASE, AMYLASE in the last 168 hours. No results for input(s): AMMONIA in the last  168 hours. CBC: Recent Labs  Lab 01/11/19 1755  01/12/19 0828 01/13/19 0208 01/14/19 0738 01/15/19 1204  WBC 9.2  --  9.0 7.2 8.3 7.6  NEUTROABS 6.7  --   --   --   --   --   HGB 9.0*   < > 9.2* 8.4* 7.4* 7.9*  HCT 29.3*   < > 29.2* 26.5* 23.3* 25.7*  MCV 77.3*  --  75.6* 76.4* 73.5* 76.0*  PLT 310  --  274 262 227 209   < > = values in this interval not displayed.   Cardiac Enzymes: Recent Labs  Lab 01/11/19 1755 01/11/19 2043 01/12/19 0242 01/12/19 0828  TROPONINI 0.06* 0.07* 0.07* 0.05*   CBG: Recent Labs  Lab 01/15/19 0621 01/15/19 1045 01/15/19 1723 01/15/19 2125 01/16/19 0637  GLUCAP 127* 117* 232* 124* 159*    Studies/Results: Ir Fluoro Guide Cv Line Right  Result Date: 01/14/2019 INDICATION: End-stage renal disease, occluded access, hyperkalemia EXAM: ULTRASOUND FLUOROSCOPIC RIGHT EJ TEMPORARY DIALYSIS  CATHETER (MAHURKAR CATHETER) MEDICATIONS: 1% lidocaine local ANESTHESIA/SEDATION: Moderate Sedation Time: None. [name] level of consciousness and vital signs were monitored continuously by radiology nursing throughout the procedure under my direct supervision. FLUOROSCOPY TIME:  Fluoroscopy Time: 1 minutes 6 seconds (3 mGy). COMPLICATIONS: None immediate. PROCEDURE: Informed written consent was obtained from the patient after a thorough discussion of the procedural risks, benefits and alternatives. All questions were addressed. Maximal Sterile Barrier Technique was utilized including caps, mask, sterile gowns, sterile gloves, sterile drape, hand hygiene and skin antiseptic. A timeout was performed prior to the initiation of the procedure. Under sterile conditions and local anesthesia, initially a tensor made to access the right internal jugular vein however this was unsuccessful hepatic and guidewire centrally. There is an adjacent dilated right external jugular vein. In a similar fashion, ultrasound micropuncture access performed of the right external jugular vein. Guidewire advanced centrally into the IVC. Micro dilator advanced. Measurements obtained for the appropriate length. Over the Amplatz guidewire, dilatation performed to insert a 20 cm temporary dialysis catheter. Tip position in the proximal right atrium. Access ready for use. Blood aspirated easily followed by saline and heparin flushes. External caps applied. Catheter secured with Ethilon sutures. Sterile dressing placed over the site. Patient tolerated the procedure well. No immediate complication. IMPRESSION: Successful ultrasound right external jugular 20 cm temporary dialysis catheter as above. Ready for use. Electronically Signed   By: Jerilynn Mages.  Shick M.D.   On: 01/14/2019 15:22   Ir US Guide Vasc Access Right  Result Date: 01/14/2019 INDICATION: End-stage renal disease, occluded access, hyperkalemia EXAM: ULTRASOUND FLUOROSCOPIC RIGHT EJ  TEMPORARY DIALYSIS CATHETER [facility] CATHETER) MEDICATIONS: 1% lidocaine local ANESTHESIA/SEDATION: Moderate Sedation Time: None. [name] level of consciousness and vital signs were monitored continuously by radiology nursing throughout the procedure under my direct supervision. FLUOROSCOPY TIME:  Fluoroscopy Time: 1 minutes 6 seconds (3 mGy). COMPLICATIONS: None immediate. PROCEDURE: Informed written consent was obtained from the patient after a thorough discussion of the procedural risks, benefits and alternatives. All questions were addressed. Maximal Sterile Barrier Technique was utilized including caps, mask, sterile gowns, sterile gloves, sterile drape, hand hygiene and skin antiseptic. A timeout was performed prior to the initiation of the procedure. Under sterile conditions and local anesthesia, initially a tensor made to access the right internal jugular vein however this was unsuccessful hepatic and guidewire centrally. There is an adjacent dilated right external jugular vein. In a similar fashion, ultrasound micropuncture access performed of the right external  jugular vein. Guidewire advanced centrally into the IVC. Micro dilator advanced. Measurements obtained for the appropriate length. Over the Amplatz guidewire, dilatation performed to insert a 20 cm temporary dialysis catheter. Tip position in the proximal right atrium. Access ready for use. Blood aspirated easily followed by saline and heparin flushes. External caps applied. Catheter secured with Ethilon sutures. Sterile dressing placed over the site. Patient tolerated the procedure well. No immediate complication. IMPRESSION: Successful ultrasound right external jugular 20 cm temporary dialysis catheter as above. Ready for use. Electronically Signed   By: Jerilynn Mages.  Shick M.D.   On: 01/14/2019 15:22   Medications: . ferric gluconate (FERRLECIT/NULECIT) IV 125 mg (01/15/19 1443)   . aspirin EC  81 mg Oral Daily  . Chlorhexidine Gluconate Cloth   6 each Topical Q0600  . cloNIDine  0.1 mg Transdermal Weekly  . doxercalciferol  5 mcg Intravenous Q T,Th,Sa-HD  . feeding supplement (NEPRO CARB STEADY)  237 mL Oral BID BM  . fluticasone  2 spray Each Nare Daily  . folic acid  1 mg Oral Daily  . gabapentin  100 mg Oral TID  . guaiFENesin  600 mg Oral BID  . insulin aspart  0-9 Units Subcutaneous TID WC  . multivitamin with minerals  1 tablet Oral Daily  . nicotine  21 mg Transdermal Daily  . senna-docusate  1 tablet Oral BID  . sevelamer carbonate  1,600 mg Oral TID WC  . thiamine  100 mg Oral Daily   Or  . thiamine  100 mg Intravenous Daily

## 2019-01-17 LAB — CBC
HCT: 25.8 % — ABNORMAL LOW (ref 36.0–46.0)
Hemoglobin: 8 g/dL — ABNORMAL LOW (ref 12.0–15.0)
MCH: 23.4 pg — ABNORMAL LOW (ref 26.0–34.0)
MCHC: 31 g/dL (ref 30.0–36.0)
MCV: 75.4 fL — ABNORMAL LOW (ref 80.0–100.0)
Platelets: 200 10*3/uL (ref 150–400)
RBC: 3.42 MIL/uL — ABNORMAL LOW (ref 3.87–5.11)
RDW: 20.8 % — ABNORMAL HIGH (ref 11.5–15.5)
WBC: 7.4 10*3/uL (ref 4.0–10.5)
nRBC: 0 % (ref 0.0–0.2)

## 2019-01-17 LAB — RENAL FUNCTION PANEL
Albumin: 2.1 g/dL — ABNORMAL LOW (ref 3.5–5.0)
Anion gap: 12 (ref 5–15)
BUN: 32 mg/dL — ABNORMAL HIGH (ref 8–23)
CO2: 29 mmol/L (ref 22–32)
Calcium: 8.6 mg/dL — ABNORMAL LOW (ref 8.9–10.3)
Chloride: 91 mmol/L — ABNORMAL LOW (ref 98–111)
Creatinine, Ser: 6.61 mg/dL — ABNORMAL HIGH (ref 0.44–1.00)
GFR calc Af Amer: 7 mL/min — ABNORMAL LOW (ref >60)
GFR calc non Af Amer: 6 mL/min — ABNORMAL LOW (ref >60)
Glucose, Bld: 110 mg/dL — ABNORMAL HIGH (ref 70–99)
Phosphorus: 4.1 mg/dL (ref 2.5–4.6)
Potassium: 5.4 mmol/L — ABNORMAL HIGH (ref 3.5–5.1)
Sodium: 132 mmol/L — ABNORMAL LOW (ref 135–145)

## 2019-01-17 LAB — GLUCOSE, CAPILLARY
Glucose-Capillary: 103 mg/dL — ABNORMAL HIGH (ref 70–99)
Glucose-Capillary: 151 mg/dL — ABNORMAL HIGH (ref 70–99)
Glucose-Capillary: 160 mg/dL — ABNORMAL HIGH (ref 70–99)

## 2019-01-17 LAB — BASIC METABOLIC PANEL
Anion gap: 13 (ref 5–15)
BUN: 31 mg/dL — ABNORMAL HIGH (ref 8–23)
CO2: 26 mmol/L (ref 22–32)
Calcium: 8.6 mg/dL — ABNORMAL LOW (ref 8.9–10.3)
Chloride: 93 mmol/L — ABNORMAL LOW (ref 98–111)
Creatinine, Ser: 6.19 mg/dL — ABNORMAL HIGH (ref 0.44–1.00)
GFR calc Af Amer: 8 mL/min — ABNORMAL LOW (ref >60)
GFR calc non Af Amer: 7 mL/min — ABNORMAL LOW (ref >60)
Glucose, Bld: 113 mg/dL — ABNORMAL HIGH (ref 70–99)
Potassium: 5.1 mmol/L (ref 3.5–5.1)
Sodium: 132 mmol/L — ABNORMAL LOW (ref 135–145)

## 2019-01-17 LAB — HEPATITIS B SURFACE ANTIGEN: Hepatitis B Surface Ag: NEGATIVE

## 2019-01-17 MED ORDER — SODIUM CHLORIDE 0.9 % IV SOLN: 100.0000 mL | INTRAVENOUS | Status: DC | PRN

## 2019-01-17 MED ORDER — ALTEPLASE 2 MG IJ SOLR: 2.0000 mg | Freq: Once | INTRAMUSCULAR | Status: DC | PRN

## 2019-01-17 MED ORDER — LIDOCAINE-PRILOCAINE 2.5-2.5 % EX CREA: 1.0000 "application " | TOPICAL_CREAM | CUTANEOUS | Status: DC | PRN

## 2019-01-17 MED ORDER — ZOLPIDEM TARTRATE 5 MG PO TABS
5.0000 mg | ORAL_TABLET | Freq: Every evening | ORAL | Status: DC | PRN
  Administered 2019-01-17 – 2019-01-19 (×3): 5 mg via ORAL
  Filled 2019-01-17 (×3): qty 1

## 2019-01-17 MED ORDER — PENTAFLUOROPROP-TETRAFLUOROETH EX AERO: 1.0000 "application " | INHALATION_SPRAY | CUTANEOUS | Status: DC | PRN

## 2019-01-17 MED ORDER — LIDOCAINE HCL (PF) 1 % IJ SOLN: 5.0000 mL | INTRAMUSCULAR | Status: DC | PRN

## 2019-01-17 MED ORDER — HEPARIN SODIUM (PORCINE) 1000 UNIT/ML IJ SOLN
INTRAMUSCULAR | Status: AC
  Filled 2019-01-17: qty 4

## 2019-01-17 MED ORDER — DOXERCALCIFEROL 4 MCG/2ML IV SOLN
INTRAVENOUS | Status: AC
  Filled 2019-01-17: qty 4

## 2019-01-17 MED ORDER — DARBEPOETIN ALFA 150 MCG/0.3ML IJ SOSY
PREFILLED_SYRINGE | INTRAMUSCULAR | Status: AC
  Administered 2019-01-17: 150 ug
  Filled 2019-01-17: qty 0.3

## 2019-01-17 MED ORDER — HEPARIN SODIUM (PORCINE) 1000 UNIT/ML DIALYSIS
1000.0000 [IU] | INTRAMUSCULAR | Status: DC | PRN
  Administered 2019-01-17: 1000 [IU] via INTRAVENOUS_CENTRAL
  Filled 2019-01-17: qty 1

## 2019-01-17 MED ORDER — HEPARIN SODIUM (PORCINE) 1000 UNIT/ML DIALYSIS: 3000.0000 [IU] | Freq: Once | INTRAMUSCULAR | Status: DC

## 2019-01-17 NOTE — Progress Notes (Signed)
Pt became tearful/crying this evening. She stated that she "wants to talk to someone about going to drug rehab" upon discharge. She stated that she did not think that outpatient would work for her because of peers and environment, she is currently living alone in hotel [employer]).

## 2019-01-17 NOTE — Evaluation (Signed)
Physical Therapy Evaluation Patient Details Name: Rhonda Lynch MRN: [id number] DOB: 1956/02/26 Today's Date: 01/17/2019   History of Present Illness   Pt is a 63 y.o. female who was admitted 5/3 with SOB and chest pain felt to be due to acute pulmonary edema in the setting of dialysis non-compliance. 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  Presently pt is unsteady up on her feet and needing min guard to min assist.  She lives alone, and doesn't have consistent assist.  She wants to go back to the residential hotel, but needs to be independent or go to SNF for rehab..  Pt currently limited functionally due to the problems listed below.  (see problems list.)  Pt will benefit from PT to maximize function and safety to be able to get home safely with limited available assist.     Follow Up Recommendations Home health PT(if can reach Independence in next 1-2 days o/w SNF)    Equipment Recommendations  None recommended by PT    Recommendations for Other Services       Precautions / Restrictions Precautions Precautions: Fall      Mobility  Bed Mobility Overal bed mobility: Needs Assistance Bed Mobility: Supine to Sit;Sit to Supine     Supine to sit: Supervision Sit to supine: Supervision      Transfers Overall transfer level: Needs assistance   Transfers: Sit to/from Stand Sit to Stand: Min guard         General transfer comment: for safety due to mild unsteadiness  Ambulation/Gait Ambulation/Gait assistance: Min guard Gait Distance (Feet): 40 Feet Assistive device: None Gait Pattern/deviations: Step-through pattern Gait velocity: slower   General Gait Details: overall unsteady with some mild staggering/drift, but no overt LOB  Stairs             Wheelchair Mobility    Modified Rankin (Stroke Patients Only)       Balance Overall balance assessment: Mild deficits observed, not formally tested                                           Pertinent Vitals/Pain Pain Assessment: Faces Faces Pain Scale: Hurts a little bit Pain Location: stomach Pain Descriptors / Indicators: Discomfort Pain Intervention(s): Monitored during session    Home Living Family/patient expects to be discharged to:: Other (Comment)(hotel presently) Living Arrangements: Alone Available Help at Discharge: Available PRN/intermittently;Family(son can assist as able) Type of Home: (hotel) Home Access: Level entry     Home Layout: One [place]: [place] - 4 wheels;Cane - single point;Bedside commode      Prior Function Level of Independence: Independent         Comments: does not drive; does her finances and medication management; SCAT to dialysis or friends take her     Hand Dominance   Dominant Hand: Right    Extremity/Trunk Assessment   Upper Extremity Assessment Upper Extremity Assessment: Overall WFL for tasks assessed    Lower Extremity Assessment Lower Extremity Assessment: Overall WFL for tasks assessed(generally weak due to inactivity in hospital)       Communication   Communication: No difficulties  Cognition Arousal/Alertness: Awake/alert Behavior During Therapy: WFL for tasks assessed/performed Overall Cognitive  Status: (NT formally, but functional for evaluation)                                        General Comments      Exercises     Assessment/Plan    PT Assessment Patient needs continued PT services  PT Problem List Decreased strength;Decreased activity tolerance;Decreased balance;Decreased mobility;Decreased safety awareness;Decreased knowledge of use of DME       PT Treatment Interventions DME instruction;Gait training;Functional mobility  training;Therapeutic activities;Balance training;Patient/family education    PT Goals (Current goals can be found in the Care Plan section)  Acute Rehab PT Goals Patient Stated Goal: back to the hotel (due to need for social isolation) per pt. PT Goal Formulation: With patient Time For Goal Achievement: 01/31/19 Potential to Achieve Goals: Good    Frequency Min 3X/week   Barriers to discharge        Co-evaluation               AM-PAC PT "6 Clicks" Mobility  Outcome Measure Help needed turning from your back to your side while in a flat bed without using bedrails?: None Help needed moving from lying on your back to sitting on the side of a flat bed without using bedrails?: None Help needed moving to and from a bed to a chair (including a wheelchair)?: A Little Help needed standing up from a chair using your arms (e.g., wheelchair or bedside chair)?: A Little Help needed to walk in hospital room?: A Little Help needed climbing 3-5 steps with a railing? : A Little 6 Click Score: 20    End of Session   Activity Tolerance: Patient tolerated treatment well Patient left: in bed;with call bell/phone within reach;with bed alarm set Nurse Communication: Mobility status PT Visit Diagnosis: Unsteadiness on feet (R26.81);Muscle weakness (generalized) (M62.81)    Time: 0109-3235 PT Time Calculation (min) (ACUTE ONLY): 17 min   Charges:   PT Evaluation $PT Eval Moderate Complexity: 1 Mod          01/17/2019  Donnella Sham, PT Acute Rehabilitation Services [phone]  (pager) [phone]  (office)  Rhonda Lynch 01/17/2019, 4:37 PM

## 2019-01-17 NOTE — Progress Notes (Signed)
PROGRESS NOTE  Rhonda Lynch QQI:[id number] DOB: 1955-09-26 DOA: 01/11/2019 PCP: Clent Demark, PA-C   LOS: 5 days   Brief narrative:  Rhonda Lynch is a 63 y.o. female with medical history significant of ESRD-HD (TTS), DM, HTN, stroke, GERD, HCV, sCHF, polysubstance abuse (tobacco, alcohol, heroin, cocaine), chronic elevation of troponin, presented to hospital with chest pain and shortness of breath. ED Course: pt was found to have Negative COVID-19 test, D-dimer 9.63, troponin 0.06, WBC 9.2 (CBC showed shcistocytes, pending smear) potassium 5.4, bicarbonate 18, creatinine 16.9, BUN 116, temperature normal, tachycardia, tachypnea, oxygen saturation 70s at RA per EMS-->100 on 4L nasal cannula oxygen, chest x-ray showed cardiomegaly and pulmonary edema.      Subjective: Complains of mild left arm pain.  Denies any nausea, vomiting, fever or chills.  Assessment/Plan:  Principal Problem:   Acute pulmonary edema (HCC) Active Problems:   TOBACCO ABUSE   Alcohol abuse   Cocaine abuse (HCC)   Elevated troponin   Hypertension   Noncompliance   Acute respiratory failure with hypoxia (HCC)   Insulin dependent diabetes mellitus (HCC)   Chest pain   End-stage renal disease on hemodialysis (HCC)   Hyperkalemia   Acute on chronic systolic CHF (congestive heart failure) (HCC)   Fluid overload   Acute metabolic encephalopathy  Chest pain -improved. Atypical in nature.  CT angiogram was negative for pulmonary embolism or dissection.  Troponins are mildly elevated but flat.  Chronic elevation of troponins.  Echocardiogram was negative for acute findings.  Continue PPI for esophagitis, gastritis.   Left arm pain.  Thrombosed AV graft. Seen by vascular surgery.  Status post  right external jugular temporary dialysis catheter placement on 01/14/2019.  Plan is for tunneled hemodialysis catheter placement on Monday.  Acute hypoxic respiratory failure/acute on chronic combined chf due to missing  dialysis and volume overload. X-ray showed pulmonary edema on presentation.  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 Improved.  96% on room air.  Acute metabolic encephalopathy, on presentation Likely secondary to intoxication from cocaine/uremia and hypoxia.  Resolved.  Nutrition.  Dysphagia III diet/thin liquid   ESRD TTS Noncompliance, missed HD for a week prior to admission.  Nephrology on board.  Continue hemodialysis during hospitalization.  Essential HTN:  On  clonidine patch. Resume amlodipine from home.  Will closely monitor.  Diabetes mellitus type 2. Continue diabetic diet, sliding scale insulin. Will closely monitor.  Microcytic anemia Stable at this time.  Polysubstance abuse Long history of  cocaine use.  Supportive care.  No signs of withdrawal  VTE Prophylaxis: Heparin subcu  Code Status: Full code  Family Communication: None today  Disposition Plan: Home, awaiting for vascular surgery intervention on Monday.  Consultants:  Nephrology, critical care, vascular surgery  Procedures:  BiPAP  Dialysis  Right external jugular temporary dialysis catheter placement on 01/14/2019.  Antibiotics: Anti-infectives (From admission, onward)   Start     Dose/Rate Route Frequency Ordered Stop   01/19/19 0000  ceFAZolin (ANCEF) IVPB 1 g/50 mL premix    Note to Pharmacy:  Send with pt to OR   1 g 100 mL/hr over 30 Minutes Intravenous On call 01/16/19 1021 01/20/19 0000      Objective: Vitals:   01/17/19 1100 01/17/19 1204  BP: 135/67 128/77  Pulse: 89   Resp: 14 13  Temp: 97.9 F (36.6 C) 98 F (36.7 C)  SpO2: 96% 96%    Intake/Output Summary (Last 24 hours) at 01/17/2019  [place] filed at 01/17/2019 1100 Gross per 24 hour  Intake 677 ml  Output 3000 ml  Net -2323 ml   Filed Weights   01/15/19 1210 01/15/19 1550 01/17/19 0700  Weight: 57.1 kg 53.9 kg 53.8 kg   Body mass index is 22.41 kg/m.   Physical  Exam:  General: Alert awake and oriented.  Chronically ill. HENT: Normocephalic, pupils equally reacting to light and accommodation.  No scleral pallor or icterus noted. Oral mucosa is moist.  Right EJ temporary hemodialysis catheter. Chest:  Clear breath sounds.  Diminished breath sounds bilaterally. No crackles or wheezes.  CVS: S1 &S2 heard. No murmur.  Regular rate and rhythm. Abdomen: Soft, nontender, nondistended.  Bowel sounds are heard.  Liver is not palpable, no abdominal mass palpated Extremities: No cyanosis, clubbing or edema.  Peripheral pulses are palpable.  Left upper extremity AV graft with mild tenderness Psych: Alert, awake and oriented, normal mood CNS:  No cranial nerve deficits.  Power equal in all extremities.  No sensory deficits noted.  No cerebellar signs.   Skin: Warm and dry.  No rashes noted.   Data Review: I have personally reviewed the following laboratory data and studies,  CBC: Recent Labs  Lab 01/11/19 1755  01/12/19 0828 01/13/19 0208 01/14/19 0738 01/15/19 1204 01/17/19 0222  WBC 9.2  --  9.0 7.2 8.3 7.6 7.4  NEUTROABS 6.7  --   --   --   --   --   --   HGB 9.0*   < > 9.2* 8.4* 7.4* 7.9* 8.0*  HCT 29.3*   < > 29.2* 26.5* 23.3* 25.7* 25.8*  MCV 77.3*  --  75.6* 76.4* 73.5* 76.0* 75.4*  PLT 310  --  274 262 227 209 200   < > = values in this interval not displayed.   Basic Metabolic Panel: Recent Labs  Lab 01/14/19 0738 01/14/19 2221 01/15/19 1204 01/17/19 0222 01/17/19 0705  NA 136 133* 135 132* 132*  K 6.2* 4.3 4.9 5.1 5.4*  CL 100 97* 94* 93* 91*  CO2 26 26 31 26 29   GLUCOSE 110* 202* 145* 113* 110*  BUN 82* 22 33* 31* 32*  CREATININE 12.82* 5.25* 6.99* 6.19* 6.61*  CALCIUM 7.7* 7.6* 7.9* 8.6* 8.6*  PHOS  --   --  4.9*  --  4.1   Liver Function Tests: Recent Labs  Lab 01/15/19 1204 01/17/19 0705  ALBUMIN 2.1* 2.1*   No results for input(s): LIPASE, AMYLASE in the last 168 hours. No results for input(s): AMMONIA in the last  168 hours. Cardiac Enzymes: Recent Labs  Lab 01/11/19 1755 01/11/19 2043 01/12/19 0242 01/12/19 0828  TROPONINI 0.06* 0.07* 0.07* 0.05*   BNP (last 3 results) Recent Labs    02/12/18 1534 06/20/18 2042 12/03/18 1209  BNP 225.7* >4,500.0* >4,500.0*    ProBNP (last 3 results) No results for input(s): PROBNP in the last 8760 hours.  CBG: Recent Labs  Lab 01/16/19 1117 01/16/19 1555 01/16/19 2046 01/17/19 0554 01/17/19 1205  GLUCAP 114* 114* 123* 103* 151*   Recent Results (from the past 240 hour(s))  SARS Coronavirus 2 (CEPHEID- Performed in [place] hospital lab), Hosp Order     Status: None   Collection Time: 01/11/19  7:57 PM  Result Value Ref Range Status   SARS Coronavirus 2 NEGATIVE NEGATIVE Final    Comment: (NOTE) If result is NEGATIVE SARS-CoV-2 target nucleic acids are NOT DETECTED. The SARS-CoV-2 RNA is generally detectable in upper  and lower  respiratory specimens during the acute phase of infection. The lowest  concentration of SARS-CoV-2 viral copies this assay can detect is 250  copies / mL. A negative result does not preclude SARS-CoV-2 infection  and should not be used as the sole basis for treatment or other  patient management decisions.  A negative result may occur with  improper specimen collection / handling, submission of specimen other  than nasopharyngeal swab, presence of viral mutation(s) within the  areas targeted by this assay, and inadequate number of viral copies  (<250 copies / mL). A negative result must be combined with clinical  observations, patient history, and epidemiological information. If result is POSITIVE SARS-CoV-2 target nucleic acids are DETECTED. The SARS-CoV-2 RNA is generally detectable in upper and lower  respiratory specimens dur ing the acute phase of infection.  Positive  results are indicative of active infection with SARS-CoV-2.  Clinical  correlation with patient history and other diagnostic information  is  necessary to determine patient infection status.  Positive results do  not rule out bacterial infection or co-infection with other viruses. If result is PRESUMPTIVE POSTIVE SARS-CoV-2 nucleic acids MAY BE PRESENT.   A presumptive positive result was obtained on the submitted specimen  and confirmed on repeat testing.  While 2019 novel coronavirus  (SARS-CoV-2) nucleic acids may be present in the submitted sample  additional confirmatory testing may be necessary for epidemiological  and / or clinical management purposes  to differentiate between  SARS-CoV-2 and other Sarbecovirus currently known to infect humans.  If clinically indicated additional testing with an alternate test  methodology [phone]) is advised. The SARS-CoV-2 RNA is generally  detectable in upper and lower respiratory sp ecimens during the acute  phase of infection. The expected result is Negative. Fact Sheet for Patients:  StrictlyIdeas.no Fact Sheet for Healthcare Providers: BankingDealers.co.za This test is not yet approved or cleared by the [place] FDA and has been authorized for detection and/or diagnosis of SARS-CoV-2 by FDA under an Emergency Use Authorization (EUA).  This EUA will remain in effect (meaning this test can be used) for the duration of the COVID-19 declaration under Section 564(b)(1) of the Act, 21 U.S.C. section 360bbb-3(b)(1), unless the authorization is terminated or revoked sooner. Performed at [facility], [place] [address], [place], [place] [address]      Studies: No results found.  Scheduled Meds: . amLODipine  5 mg Oral Daily  . aspirin EC  81 mg Oral Daily  . Chlorhexidine Gluconate Cloth  6 each Topical Q0600  . cloNIDine  0.1 mg Transdermal Weekly  . darbepoetin (ARANESP) injection - DIALYSIS  150 mcg Intravenous Q Sat-HD  . doxercalciferol  5 mcg Intravenous Q T,Th,Sa-HD  . feeding supplement (NEPRO CARB STEADY)   237 mL Oral BID BM  . fluticasone  2 spray Each Nare Daily  . folic acid  1 mg Oral Daily  . gabapentin  100 mg Oral TID  . guaiFENesin  600 mg Oral BID  . hydrocortisone cream  1 application Topical BID  . insulin aspart  0-9 Units Subcutaneous TID WC  . multivitamin with minerals  1 tablet Oral Daily  . nicotine  21 mg Transdermal Daily  . pantoprazole  40 mg Oral Daily  . senna-docusate  1 tablet Oral BID  . sevelamer carbonate  1,600 mg Oral TID WC  . thiamine  100 mg Oral Daily   Or  . thiamine  100 mg Intravenous Daily  Continuous Infusions: . [START ON 01/19/2019]  ceFAZolin (ANCEF) IV    . ferric gluconate (FERRLECIT/NULECIT) IV Stopped (01/17/19 1123)     Flora Lipps, MD  [facility] 01/17/2019

## 2019-01-17 NOTE — Progress Notes (Signed)
[facility] Progress Note    Assessment/ Plan:   [facility] T[facility] 4h 400/800 54kg 2/2.25 bath Hep 3000 units IV TIWLUA AVG - hectorol 5 mcg IV TIW - venofer 100 mg IV X 5 doses 1/5 given 01/03/19 - mircera 100 mg every 2 wks , (last dose 100 mcg IV 12/25/18)  Problem/Plan: 1. Volume overload/Acute respiratory failure D/T noncompliance with HD. Improved with HD.HD yest with 3 l uf tolerated , next hd tomor sat on schedule  .now at edw /?lower at dc  2. AMS-possible withdrawal, uremia.now @ baseline. Holding sedating meds. Per primary. 3. Chest pain -trop flat,EKG neg and CTA chest negative for PE (old pe) 4. ESRD on HD: TTS Normal schedule =missed last 3 sessions for "recurrent diarrhea."   Seen on HD 2/2.25 116/63 UF 3.5L (3L net) tolerating RIJ temp  Next HD Tuesday  5. Clotted LUA AVGG =Had L AVG declot w/ arterial revision on 12/11/18.Last HD was 5/4 (Mon)/Unable to HDon admitas she was thrombosed/  IRplaced Temp R IJ HD cath -, Dr Donzetta Matters consulted =plan for Tunneled HD cath and New R arm access Monday am  6. Diarrhea-H/O C diff colitis.none reported this am  Per primary 7. HTN-BP elevated, continueon norvasc, lower volume as tolerated. 8. Drug abuse - not currently using drugsper pt 9. Smoker - trying to quit, has nicotine patch on 10. Anemia ckd - Hb 9.8>8.4>7.9 Dosed aranesp 150 x1 (5/6)   11. BMD-Ca /phos stable . Continue binders, VDRA. Phos 4.1 5/9 12. Hep C 13. H/o CVA 67. DM = per admit team   Subjective:   Sleepy but arousable and appropriate. No sob or cp; no further diarrhea.   Objective:   BP 131/76 (BP Location: Right Arm)   Pulse (!) 103   Temp 98.4 F (36.9 C) (Oral)   Resp 17   Wt 53.8 kg   SpO2 100%   BMI 22.41 kg/m   Intake/Output Summary (Last 24 hours) at 01/17/2019 0744 Last data filed at 01/17/2019 0300 Gross per 24 hour  Intake 1157 ml  Output 0 ml  Net 1157 ml   Weight change: -3.3 kg  Physical  Exam: General:Chronically ill appearing female in NAD/ alert after woke from  Sleep, OX3 Heart: RRR, no m, r, gSR on monitor.  Lungs:decr bases otherwise CTA no WOB Abdomen:Active BSsoft , Nt, ND Extremities:no pedaledema Dialysis Access: Rt EJ Temp Cath / LUAAVGNObruit   Imaging: No results found.  Labs: BMET Recent Labs  Lab 01/11/19 1755 01/11/19 1800 01/12/19 0828 01/14/19 0738 01/14/19 2221 01/15/19 1204 01/17/19 0222 01/17/19 0705  NA 142 141 139 136 133* 135 132* 132*  K 5.5* 5.4* 4.5 6.2* 4.3 4.9 5.1 5.4*  CL 103  --  101 100 97* 94* 93* 91*  CO2 18*  --  23 26 26 31 26 29   GLUCOSE 126*  --  90 110* 202* 145* 113* 110*  BUN 116*  --  51* 82* 22 33* 31* 32*  CREATININE 16.93*  --  10.01* 12.82* 5.25* 6.99* 6.19* 6.61*  CALCIUM 7.3*  --  7.9* 7.7* 7.6* 7.9* 8.6* 8.6*  PHOS  --   --   --   --   --  4.9*  --  4.1   CBC Recent Labs  Lab 01/11/19 1755  01/13/19 0208 01/14/19 0738 01/15/19 1204 01/17/19 0222  WBC 9.2   < > 7.2 8.3 7.6 7.4  NEUTROABS 6.7  --   --   --   --   --  HGB 9.0*   < > 8.4* 7.4* 7.9* 8.0*  HCT 29.3*   < > 26.5* 23.3* 25.7* 25.8*  MCV 77.3*   < > 76.4* 73.5* 76.0* 75.4*  PLT 310   < > 262 227 209 200   < > = values in this interval not displayed.    Medications:    . amLODipine  5 mg Oral Daily  . aspirin EC  81 mg Oral Daily  . Chlorhexidine Gluconate Cloth  6 each Topical Q0600  . cloNIDine  0.1 mg Transdermal Weekly  . darbepoetin (ARANESP) injection - DIALYSIS  150 mcg Intravenous Q Sat-HD  . doxercalciferol  5 mcg Intravenous Q T,Th,Sa-HD  . feeding supplement (NEPRO CARB STEADY)  237 mL Oral BID BM  . fluticasone  2 spray Each Nare Daily  . folic acid  1 mg Oral Daily  . gabapentin  100 mg Oral TID  . guaiFENesin  600 mg Oral BID  . heparin  3,000 Units Dialysis Once in dialysis  . hydrocortisone cream  1 application Topical BID  . insulin aspart  0-9 Units Subcutaneous TID WC  . multivitamin with  minerals  1 tablet Oral Daily  . nicotine  21 mg Transdermal Daily  . pantoprazole  40 mg Oral Daily  . senna-docusate  1 tablet Oral BID  . sevelamer carbonate  1,600 mg Oral TID WC  . thiamine  100 mg Oral Daily   Or  . thiamine  100 mg Intravenous Daily      Otelia Santee, MD 01/17/2019, 7:44 AM

## 2019-01-17 NOTE — Progress Notes (Signed)
Pt left unit for dialysis treatment

## 2019-01-18 ENCOUNTER — Encounter (HOSPITAL_COMMUNITY): Payer: Self-pay | Admitting: *Deleted

## 2019-01-18 LAB — GLUCOSE, CAPILLARY
Glucose-Capillary: 132 mg/dL — ABNORMAL HIGH (ref 70–99)
Glucose-Capillary: 133 mg/dL — ABNORMAL HIGH (ref 70–99)
Glucose-Capillary: 168 mg/dL — ABNORMAL HIGH (ref 70–99)
Glucose-Capillary: 170 mg/dL — ABNORMAL HIGH (ref 70–99)

## 2019-01-18 MED ORDER — SODIUM ZIRCONIUM CYCLOSILICATE 10 G PO PACK
10.0000 g | PACK | Freq: Once | ORAL | Status: AC
  Administered 2019-01-18: 11:00:00 10 g via ORAL
  Filled 2019-01-18: qty 1

## 2019-01-18 NOTE — Progress Notes (Addendum)
[facility] Progress Note    Assessment/ Plan:   [facility] 4h 400/800 54kg 2/2.25 bath Hep 3000 units IV TIWLUA AVG - hectorol 5 mcg IV TIW - venofer 100 mg IV X 5 doses 1/5 given 01/03/19 - mircera 100 mg every 2 wks , (last dose 100 mcg IV 12/25/18)  Problem/Plan: 1. Volume overload/Acute respiratory failure D/T noncompliance with HD. Improved with HD.HD yest with 3 l uf tolerated , next hd tomor sat on schedule .now at edw /?lower at dc  2. AMS-possible withdrawal, uremia.now @baseline . Holding sedating meds. Per primary. 3. Chest pain -trop flat,EKG neg and CTA chest negative for PE (old pe) 4. ESRD on HD: TTS Normal schedule=missed last 3 sessions for"recurrent diarrhea."  Will give Lokelma 10mg  x1 on 5/10  Last HD Saturday for 4hrs net UF 3L  Next HD Tuesday  5. Clotted LUA AVGG =Had L AVG declot w/ arterial revision on 12/11/18.Last HD was 5/4 (Mon)/Unable to HDon admitas she was thrombosed/ IRplacedTemp R IJHD cath-, Dr Donzetta Matters consulted =plan for Tunneled HD cath and New R arm access Monday am 6. Diarrhea-H/O C diff colitis.none reported this amPer primary 7. HTN-BP elevated, continueon norvasc, lower volume as tolerated. 8. Drug abuse - not currently using drugsper pt 9. Smoker - trying to quit, has nicotine patch on 10. Anemia ckd - Hb 9.8>8.4>7.9Dosed aranesp 150 x1 (5/6)   11. BMD-Ca/phos stable. Continue binders, VDRA. Phos 4.1 5/9 12. Hep C 13. H/o CVA 14. DM= per admit team  Subjective:   More awake today; sitting up. Appropriate and pleasant. No sob or cp.   Objective:   BP (!) 143/85 (BP Location: Left Arm)   Pulse 91   Temp 98 F (36.7 C) (Oral)   Resp 16   Wt 53.8 kg   SpO2 98%   BMI 22.41 kg/m   Intake/Output Summary (Last 24 hours) at 01/18/2019 0939 Last data filed at 01/18/2019 0735 Gross per 24 hour  Intake 480 ml  Output 3000 ml  Net -2520 ml   Weight change:   Physical  Exam: General:Chronically ill appearing female in NAD/ alert after woke from Sleep, OX3 Heart: RRR, no m, r, gSR on monitor.  Lungs:decr basesotherwise CTAno WOB Abdomen:Active BSsoft , Nt, ND Extremities:no pedaledema Dialysis Access: Rt EJ Temp Cath / LUAAVGNObruit   Imaging: No results found.  Labs: BMET Recent Labs  Lab 01/11/19 1755 01/11/19 1800 01/12/19 0828 01/14/19 0738 01/14/19 2221 01/15/19 1204 01/17/19 0222 01/17/19 0705  NA 142 141 139 136 133* 135 132* 132*  K 5.5* 5.4* 4.5 6.2* 4.3 4.9 5.1 5.4*  CL 103  --  101 100 97* 94* 93* 91*  CO2 18*  --  23 26 26 31 26 29   GLUCOSE 126*  --  90 110* 202* 145* 113* 110*  BUN 116*  --  51* 82* 22 33* 31* 32*  CREATININE 16.93*  --  10.01* 12.82* 5.25* 6.99* 6.19* 6.61*  CALCIUM 7.3*  --  7.9* 7.7* 7.6* 7.9* 8.6* 8.6*  PHOS  --   --   --   --   --  4.9*  --  4.1   CBC Recent Labs  Lab 01/11/19 1755  01/13/19 0208 01/14/19 0738 01/15/19 1204 01/17/19 0222  WBC 9.2   < > 7.2 8.3 7.6 7.4  NEUTROABS 6.7  --   --   --   --   --   HGB 9.0*   < > 8.4* 7.4* 7.9* 8.0*  HCT 29.3*   < > 26.5* 23.3* 25.7* 25.8*  MCV 77.3*   < > 76.4* 73.5* 76.0* 75.4*  PLT 310   < > 262 227 209 200   < > = values in this interval not displayed.    Medications:    . amLODipine  5 mg Oral Daily  . aspirin EC  81 mg Oral Daily  . Chlorhexidine Gluconate Cloth  6 each Topical Q0600  . cloNIDine  0.1 mg Transdermal Weekly  . darbepoetin (ARANESP) injection - DIALYSIS  150 mcg Intravenous Q Sat-HD  . doxercalciferol  5 mcg Intravenous Q T,Th,Sa-HD  . feeding supplement (NEPRO CARB STEADY)  237 mL Oral BID BM  . fluticasone  2 spray Each Nare Daily  . folic acid  1 mg Oral Daily  . gabapentin  100 mg Oral TID  . guaiFENesin  600 mg Oral BID  . hydrocortisone cream  1 application Topical BID  . insulin aspart  0-9 Units Subcutaneous TID WC  . multivitamin with minerals  1 tablet Oral Daily  . nicotine  21 mg  Transdermal Daily  . pantoprazole  40 mg Oral Daily  . senna-docusate  1 tablet Oral BID  . sevelamer carbonate  1,600 mg Oral TID WC  . thiamine  100 mg Oral Daily   Or  . thiamine  100 mg Intravenous Daily      Otelia Santee, MD 01/18/2019, 9:39 AM

## 2019-01-18 NOTE — Plan of Care (Signed)
  Problem: Education: Goal: Knowledge of General Education information will improve Description Including pain rating scale, medication(s)/side effects and non-pharmacologic comfort measures 01/18/2019 0035 by Heriberto Antigua D, RN Outcome: Progressing 01/18/2019 0035 by Burt Knack, Waynesha Rammel D, RN Outcome: Not Progressing   Problem: Clinical Measurements: Goal: Ability to maintain clinical measurements within normal limits will improve 01/18/2019 0035 by Heriberto Antigua D, RN Outcome: Progressing 01/18/2019 0035 by Burt Knack Adarian Bur D, RN Outcome: Not Progressing Goal: Will remain free from infection 01/18/2019 0035 by Heriberto Antigua D, RN Outcome: Progressing 01/18/2019 0035 by Burt Knack Saphyra Hutt D, RN Outcome: Not Progressing Goal: Diagnostic test results will improve 01/18/2019 0035 by Heriberto Antigua D, RN Outcome: Progressing 01/18/2019 0035 by Burt Knack Vylette Strubel D, RN Outcome: Not Progressing Goal: Respiratory complications will improve 01/18/2019 0035 by Heriberto Antigua D, RN Outcome: Progressing 01/18/2019 0035 by Burt Knack Taron Conrey D, RN Outcome: Not Progressing Goal: Cardiovascular complication will be avoided 01/18/2019 0035 by Heriberto Antigua D, RN Outcome: Progressing 01/18/2019 0035 by Burt Knack Terek Bee D, RN Outcome: Not Progressing   Problem: Activity: Goal: Risk for activity intolerance will decrease 01/18/2019 0035 by Heriberto Antigua D, RN Outcome: Progressing 01/18/2019 0035 by Burt Knack Casin Federici D, RN Outcome: Not Progressing   Problem: Safety: Goal: Ability to remain free from injury will improve 01/18/2019 0035 by Heriberto Antigua D, RN Outcome: Progressing 01/18/2019 0035 by Burt Knack Ambriella Kitt D, RN Outcome: Not Progressing

## 2019-01-18 NOTE — Progress Notes (Addendum)
PROGRESS NOTE  Rhonda Lynch JJK:[id number] DOB: 1956/05/18 DOA: 01/11/2019 PCP: Clent Demark, PA-C   LOS: 6 days   Brief narrative:  Rhonda Lynch is a 63 y.o. female with medical history significant of ESRD-HD (TTS), DM, HTN, stroke, GERD, HCV, sCHF, polysubstance abuse (tobacco, alcohol, heroin, cocaine), chronic elevation of troponin, presented to hospital with chest pain and shortness of breath. ED Course: pt was found to have Negative COVID-19 test, D-dimer 9.63, troponin 0.06, WBC 9.2 (CBC showed shcistocytes, pending smear) potassium 5.4, bicarbonate 18, creatinine 16.9, BUN 116, temperature normal, tachycardia, tachypnea, oxygen saturation 70s at RA per EMS-->100 on 4L nasal cannula oxygen, chest x-ray showed cardiomegaly and pulmonary edema.      Subjective: Still complains of mild arm pain otherwise no fever, shortness of breath, chest pain or palpitation.  Assessment/Plan:  Principal Problem:   Acute pulmonary edema (HCC) Active Problems:   TOBACCO ABUSE   Alcohol abuse   Cocaine abuse (HCC)   Elevated troponin   Hypertension   Noncompliance   Acute respiratory failure with hypoxia (HCC)   Insulin dependent diabetes mellitus (HCC)   Chest pain   End-stage renal disease on hemodialysis (HCC)   Hyperkalemia   Acute on chronic systolic CHF (congestive heart failure) (HCC)   Fluid overload   Acute metabolic encephalopathy  Chest pain -improved. Atypical in nature.  CT angiogram was negative for pulmonary embolism or dissection.  Troponins are mildly elevated but flat.  Chronic elevation of troponins.  Echocardiogram was negative for acute findings.  Protonix for possible esophagitis.  Left arm pain.  Thrombosed AV graft. Seen by vascular surgery.  Status post  right external jugular temporary dialysis catheter placement on 01/14/2019.  Plan is for tunneled hemodialysis catheter placement on Monday.  Neurosurgery on board  Acute hypoxic respiratory failure/acute  on chronic combined chf due to missing dialysis and volume overload. Resolved at this time.  X-ray showed pulmonary edema on presentation.  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   Acute metabolic encephalopathy, on presentation Likely secondary to intoxication from cocaine/uremia and hypoxia.  Resolved.  No signs of withdrawal noted  Nutrition.  Dysphagia III diet/thin liquid   ESRD TTS Noncompliance, missed HD for a week prior to admission.  Nephrology on board.  Continue hemodialysis during hospitalization.  Essential HTN:  On  clonidine patch. Resume amlodipine from home.  Will closely monitor.  Diabetes mellitus type 2. Continue diabetic diet, sliding scale insulin. Will closely monitor.  Microcytic anemia Stable at this time.  Polysubstance abuse Long history of  cocaine use.  Supportive care.  No signs of withdrawal  VTE Prophylaxis: Heparin subcu  Code Status: Full code  Family Communication: I spoke with the patient's son Rhonda Lynch on the phone and updated him about the clinical condition of the patient.  Disposition Plan: Home, awaiting for vascular surgery intervention on Monday.  Consultants:  Nephrology, critical care, vascular surgery  Procedures:  BiPAP  Dialysis  Right external jugular temporary dialysis catheter placement on 01/14/2019.  Antibiotics: Anti-infectives (From admission, onward)   Start     Dose/Rate Route Frequency Ordered Stop   01/19/19 0000  ceFAZolin (ANCEF) IVPB 1 g/50 mL premix    Note to Pharmacy:  Send with pt to OR   1 g 100 mL/hr over 30 Minutes Intravenous On call 01/16/19 1021 01/20/19 0000      Objective: Vitals:   01/17/19 2000 01/18/19 0734  BP: 118/67 (!) 143/85  Pulse: 99  91  Resp: 20 16  Temp: 98.4 F (36.9 C) 98 F (36.7 C)  SpO2: 97% 98%    Intake/Output Summary (Last 24 hours) at 01/18/2019 1232 Last data filed at 01/18/2019 1212 Gross per 24 hour  Intake 720 ml  Output  0 ml  Net 720 ml   Filed Weights   01/15/19 1210 01/15/19 1550 01/17/19 0700  Weight: 57.1 kg 53.9 kg 53.8 kg   Body mass index is 22.41 kg/m.   Physical Exam: General:  Average built, not in obvious distress, chronically ill HENT: Normocephalic, pupils equally reacting to light and accommodation.  No scleral pallor or icterus noted. Oral mucosa is moist.  Right EJ temporary hemodialysis catheter Chest:  Clear breath sounds.  Diminished breath sounds bilaterally. No crackles or wheezes.  CVS: S1 &S2 heard. No murmur.  Regular rate and rhythm. Abdomen: Soft, nontender, nondistended.  Bowel sounds are heard.  Liver is not palpable, no abdominal mass palpated Extremities: No cyanosis, clubbing or edema.  Peripheral pulses are palpable.  Left upper extremity AV graft Psych: Alert, awake and oriented, normal mood CNS:  No cranial nerve deficits.  Power equal in all extremities.  No sensory deficits noted.  No cerebellar signs.   Skin: Warm and dry.  No rashes noted.  Data Review: I have personally reviewed the following laboratory data and studies,  CBC: Recent Labs  Lab 01/11/19 1755  01/12/19 0828 01/13/19 0208 01/14/19 0738 01/15/19 1204 01/17/19 0222  WBC 9.2  --  9.0 7.2 8.3 7.6 7.4  NEUTROABS 6.7  --   --   --   --   --   --   HGB 9.0*   < > 9.2* 8.4* 7.4* 7.9* 8.0*  HCT 29.3*   < > 29.2* 26.5* 23.3* 25.7* 25.8*  MCV 77.3*  --  75.6* 76.4* 73.5* 76.0* 75.4*  PLT 310  --  274 262 227 209 200   < > = values in this interval not displayed.   Basic Metabolic Panel: Recent Labs  Lab 01/14/19 0738 01/14/19 2221 01/15/19 1204 01/17/19 0222 01/17/19 0705  NA 136 133* 135 132* 132*  K 6.2* 4.3 4.9 5.1 5.4*  CL 100 97* 94* 93* 91*  CO2 26 26 31 26 29   GLUCOSE 110* 202* 145* 113* 110*  BUN 82* 22 33* 31* 32*  CREATININE 12.82* 5.25* 6.99* 6.19* 6.61*  CALCIUM 7.7* 7.6* 7.9* 8.6* 8.6*  PHOS  --   --  4.9*  --  4.1   Liver Function Tests: Recent Labs  Lab 01/15/19  1204 01/17/19 0705  ALBUMIN 2.1* 2.1*   No results for input(s): LIPASE, AMYLASE in the last 168 hours. No results for input(s): AMMONIA in the last 168 hours. Cardiac Enzymes: Recent Labs  Lab 01/11/19 1755 01/11/19 2043 01/12/19 0242 01/12/19 0828  TROPONINI 0.06* 0.07* 0.07* 0.05*   BNP (last 3 results) Recent Labs    02/12/18 1534 06/20/18 2042 12/03/18 1209  BNP 225.7* >4,500.0* >4,500.0*    ProBNP (last 3 results) No results for input(s): PROBNP in the last 8760 hours.  CBG: Recent Labs  Lab 01/17/19 0554 01/17/19 1205 01/17/19 1701 01/18/19 0622 01/18/19 1100  GLUCAP 103* 151* 160* 132* 133*   Recent Results (from the past 240 hour(s))  SARS Coronavirus 2 (CEPHEID- Performed in [place] hospital lab), Hosp Order     Status: None   Collection Time: 01/11/19  7:57 PM  Result Value Ref Range Status   SARS Coronavirus 2 NEGATIVE  NEGATIVE Final    Comment: (NOTE) If result is NEGATIVE SARS-CoV-2 target nucleic acids are NOT DETECTED. The SARS-CoV-2 RNA is generally detectable in upper and lower  respiratory specimens during the acute phase of infection. The lowest  concentration of SARS-CoV-2 viral copies this assay can detect is 250  copies / mL. A negative result does not preclude SARS-CoV-2 infection  and should not be used as the sole basis for treatment or other  patient management decisions.  A negative result may occur with  improper specimen collection / handling, submission of specimen other  than nasopharyngeal swab, presence of viral mutation(s) within the  areas targeted by this assay, and inadequate number of viral copies  (<250 copies / mL). A negative result must be combined with clinical  observations, patient history, and epidemiological information. If result is POSITIVE SARS-CoV-2 target nucleic acids are DETECTED. The SARS-CoV-2 RNA is generally detectable in upper and lower  respiratory specimens dur ing the acute phase of  infection.  Positive  results are indicative of active infection with SARS-CoV-2.  Clinical  correlation with patient history and other diagnostic information is  necessary to determine patient infection status.  Positive results do  not rule out bacterial infection or co-infection with other viruses. If result is PRESUMPTIVE POSTIVE SARS-CoV-2 nucleic acids MAY BE PRESENT.   A presumptive positive result was obtained on the submitted specimen  and confirmed on repeat testing.  While 2019 novel coronavirus  (SARS-CoV-2) nucleic acids may be present in the submitted sample  additional confirmatory testing may be necessary for epidemiological  and / or clinical management purposes  to differentiate between  SARS-CoV-2 and other Sarbecovirus currently known to infect humans.  If clinically indicated additional testing with an alternate test  methodology [phone]) is advised. The SARS-CoV-2 RNA is generally  detectable in upper and lower respiratory sp ecimens during the acute  phase of infection. The expected result is Negative. Fact Sheet for Patients:  StrictlyIdeas.no Fact Sheet for Healthcare Providers: BankingDealers.co.za This test is not yet approved or cleared by the [place] FDA and has been authorized for detection and/or diagnosis of SARS-CoV-2 by FDA under an Emergency Use Authorization (EUA).  This EUA will remain in effect (meaning this test can be used) for the duration of the COVID-19 declaration under Section 564(b)(1) of the Act, 21 U.S.C. section 360bbb-3(b)(1), unless the authorization is terminated or revoked sooner. Performed at [facility], [place] [address], [place], [place] [address]      Studies: No results found.  Scheduled Meds: . amLODipine  5 mg Oral Daily  . aspirin EC  81 mg Oral Daily  . Chlorhexidine Gluconate Cloth  6 each Topical Q0600  . cloNIDine  0.1 mg Transdermal Weekly  .  darbepoetin (ARANESP) injection - DIALYSIS  150 mcg Intravenous Q Sat-HD  . doxercalciferol  5 mcg Intravenous Q T,Th,Sa-HD  . feeding supplement (NEPRO CARB STEADY)  237 mL Oral BID BM  . fluticasone  2 spray Each Nare Daily  . folic acid  1 mg Oral Daily  . gabapentin  100 mg Oral TID  . guaiFENesin  600 mg Oral BID  . hydrocortisone cream  1 application Topical BID  . insulin aspart  0-9 Units Subcutaneous TID WC  . multivitamin with minerals  1 tablet Oral Daily  . nicotine  21 mg Transdermal Daily  . pantoprazole  40 mg Oral Daily  . senna-docusate  1 tablet Oral BID  . sevelamer carbonate  1,600 mg Oral TID WC  . thiamine  100 mg Oral Daily   Or  . thiamine  100 mg Intravenous Daily    Continuous Infusions: . [START ON 01/19/2019]  ceFAZolin (ANCEF) IV    . ferric gluconate (FERRLECIT/NULECIT) IV Stopped (01/17/19 1123)     Flora Lipps, MD  [facility] 01/18/2019

## 2019-01-19 ENCOUNTER — Inpatient Hospital Stay (HOSPITAL_COMMUNITY): Payer: Medicare Other | Admitting: Certified Registered Nurse Anesthetist

## 2019-01-19 ENCOUNTER — Inpatient Hospital Stay (HOSPITAL_COMMUNITY): Payer: Medicare Other

## 2019-01-19 ENCOUNTER — Encounter (HOSPITAL_COMMUNITY)
Admission: EM | Disposition: A | Discharge status: Home / Self Care | Payer: Self-pay | Source: Home / Self Care | Attending: Internal Medicine

## 2019-01-19 ENCOUNTER — Encounter (HOSPITAL_COMMUNITY): Payer: Self-pay | Admitting: Certified Registered Nurse Anesthetist

## 2019-01-19 DIAGNOSIS — N185 Chronic kidney disease, stage 5: Secondary | ICD-10-CM

## 2019-01-19 HISTORY — PX: AV FISTULA PLACEMENT: SHX1204

## 2019-01-19 HISTORY — PX: INSERTION OF DIALYSIS CATHETER: SHX1324

## 2019-01-19 LAB — BASIC METABOLIC PANEL
Anion gap: 12 (ref 5–15)
BUN: 36 mg/dL — ABNORMAL HIGH (ref 8–23)
CO2: 28 mmol/L (ref 22–32)
Calcium: 8.8 mg/dL — ABNORMAL LOW (ref 8.9–10.3)
Chloride: 91 mmol/L — ABNORMAL LOW (ref 98–111)
Creatinine, Ser: 6.65 mg/dL — ABNORMAL HIGH (ref 0.44–1.00)
GFR calc Af Amer: 7 mL/min — ABNORMAL LOW (ref >60)
GFR calc non Af Amer: 6 mL/min — ABNORMAL LOW (ref >60)
Glucose, Bld: 118 mg/dL — ABNORMAL HIGH (ref 70–99)
Potassium: 5.8 mmol/L — ABNORMAL HIGH (ref 3.5–5.1)
Sodium: 131 mmol/L — ABNORMAL LOW (ref 135–145)

## 2019-01-19 LAB — GLUCOSE, CAPILLARY
Glucose-Capillary: 108 mg/dL — ABNORMAL HIGH (ref 70–99)
Glucose-Capillary: 105 mg/dL — ABNORMAL HIGH (ref 70–99)
Glucose-Capillary: 279 mg/dL — ABNORMAL HIGH (ref 70–99)
Glucose-Capillary: 170 mg/dL — ABNORMAL HIGH (ref 70–99)

## 2019-01-19 LAB — CBC
HCT: 26.3 % — ABNORMAL LOW (ref 36.0–46.0)
Hemoglobin: 8.1 g/dL — ABNORMAL LOW (ref 12.0–15.0)
MCH: 23.7 pg — ABNORMAL LOW (ref 26.0–34.0)
MCHC: 30.8 g/dL (ref 30.0–36.0)
MCV: 76.9 fL — ABNORMAL LOW (ref 80.0–100.0)
Platelets: 170 10*3/uL (ref 150–400)
RBC: 3.42 MIL/uL — ABNORMAL LOW (ref 3.87–5.11)
RDW: 21.4 % — ABNORMAL HIGH (ref 11.5–15.5)
WBC: 10.4 10*3/uL (ref 4.0–10.5)
nRBC: 0.3 % — ABNORMAL HIGH (ref 0.0–0.2)

## 2019-01-19 LAB — HEPATITIS B CORE ANTIBODY, TOTAL: Hep B Core Total Ab: NEGATIVE

## 2019-01-19 LAB — PROTIME-INR
INR: 1.1 (ref 0.8–1.2)
Prothrombin Time: 13.6 seconds (ref 11.4–15.2)

## 2019-01-19 SURGERY — INSERTION OF DIALYSIS CATHETER
Anesthesia: Monitor Anesthesia Care | Site: Chest | Laterality: Right

## 2019-01-19 MED ORDER — LIDOCAINE-EPINEPHRINE 0.5 %-1:200000 IJ SOLN
INTRAMUSCULAR | Status: DC | PRN
  Administered 2019-01-19: 18 mL

## 2019-01-19 MED ORDER — HEPARIN SODIUM (PORCINE) 1000 UNIT/ML IJ SOLN
INTRAMUSCULAR | Status: DC | PRN
  Administered 2019-01-19: 3400 [IU]

## 2019-01-19 MED ORDER — SODIUM CHLORIDE 0.9 % IV SOLN
INTRAVENOUS | Status: AC
  Filled 2019-01-19: qty 1.2

## 2019-01-19 MED ORDER — SODIUM CHLORIDE 0.9 % IV SOLN
INTRAVENOUS | Status: DC
  Administered 2019-01-19 (×2): via INTRAVENOUS

## 2019-01-19 MED ORDER — LIDOCAINE-EPINEPHRINE 0.5 %-1:200000 IJ SOLN
INTRAMUSCULAR | Status: AC
  Filled 2019-01-19: qty 1

## 2019-01-19 MED ORDER — SODIUM CHLORIDE 0.9 % IV SOLN
INTRAVENOUS | Status: DC | PRN
  Administered 2019-01-19: 08:00:00

## 2019-01-19 MED ORDER — PHENYLEPHRINE HCL (PRESSORS) 10 MG/ML IV SOLN
INTRAVENOUS | Status: DC | PRN
  Administered 2019-01-19 (×3): 80 ug via INTRAVENOUS

## 2019-01-19 MED ORDER — HEPARIN SODIUM (PORCINE) 1000 UNIT/ML IJ SOLN
INTRAMUSCULAR | Status: AC
  Filled 2019-01-19: qty 4

## 2019-01-19 MED ORDER — LIDOCAINE HCL (CARDIAC) PF 100 MG/5ML IV SOSY
PREFILLED_SYRINGE | INTRAVENOUS | Status: DC | PRN
  Administered 2019-01-19: 100 mg via INTRAVENOUS

## 2019-01-19 MED ORDER — HEPARIN SODIUM (PORCINE) 1000 UNIT/ML IJ SOLN
INTRAMUSCULAR | Status: AC
  Filled 2019-01-19: qty 1

## 2019-01-19 MED ORDER — LIDOCAINE 2% (20 MG/ML) 5 ML SYRINGE
INTRAMUSCULAR | Status: AC
  Filled 2019-01-19: qty 5

## 2019-01-19 MED ORDER — FENTANYL CITRATE (PF) 250 MCG/5ML IJ SOLN
INTRAMUSCULAR | Status: AC
  Filled 2019-01-19: qty 5

## 2019-01-19 MED ORDER — CHLORHEXIDINE GLUCONATE CLOTH 2 % EX PADS
6.0000 | MEDICATED_PAD | Freq: Every day | CUTANEOUS | Status: DC
  Administered 2019-01-20: 06:00:00 6 via TOPICAL

## 2019-01-19 MED ORDER — ONDANSETRON HCL 4 MG/2ML IJ SOLN
INTRAMUSCULAR | Status: DC | PRN
  Administered 2019-01-19: 4 mg via INTRAVENOUS

## 2019-01-19 MED ORDER — PNEUMOCOCCAL VAC POLYVALENT 25 MCG/0.5ML IJ INJ
0.5000 mL | INJECTION | INTRAMUSCULAR | Status: DC
  Filled 2019-01-19: qty 0.5

## 2019-01-19 MED ORDER — CHLORHEXIDINE GLUCONATE CLOTH 2 % EX PADS: 6.0000 | MEDICATED_PAD | Freq: Every day | CUTANEOUS | Status: DC

## 2019-01-19 MED ORDER — OXYCODONE HCL 5 MG PO TABS
5.0000 mg | ORAL_TABLET | ORAL | Status: DC | PRN
  Administered 2019-01-20: 10 mg via ORAL
  Filled 2019-01-19: qty 2

## 2019-01-19 MED ORDER — HEPARIN SODIUM (PORCINE) 1000 UNIT/ML IJ SOLN
3.4000 mL | Freq: Once | INTRAMUSCULAR | Status: AC
  Administered 2019-01-19: 3400 [IU] via INTRAVENOUS

## 2019-01-19 MED ORDER — FENTANYL CITRATE (PF) 100 MCG/2ML IJ SOLN
INTRAMUSCULAR | Status: DC | PRN
  Administered 2019-01-19 (×2): 25 ug via INTRAVENOUS

## 2019-01-19 MED ORDER — PROPOFOL 10 MG/ML IV BOLUS
INTRAVENOUS | Status: DC | PRN
  Administered 2019-01-19: 20 mg via INTRAVENOUS

## 2019-01-19 MED ORDER — DEXAMETHASONE SODIUM PHOSPHATE 4 MG/ML IJ SOLN
INTRAMUSCULAR | Status: DC | PRN
  Administered 2019-01-19: 5 mg via INTRAVENOUS

## 2019-01-19 MED ORDER — PROPOFOL 500 MG/50ML IV EMUL
INTRAVENOUS | Status: DC | PRN
  Administered 2019-01-19: 75 ug/kg/min via INTRAVENOUS

## 2019-01-19 MED ORDER — 0.9 % SODIUM CHLORIDE (POUR BTL) OPTIME
TOPICAL | Status: DC | PRN
  Administered 2019-01-19: 1000 mL

## 2019-01-19 MED ORDER — OXYCODONE HCL 5 MG PO TABS
10.0000 mg | ORAL_TABLET | ORAL | Status: AC
  Administered 2019-01-19: 10 mg via ORAL
  Filled 2019-01-19: qty 2

## 2019-01-19 MED ORDER — MIDAZOLAM HCL 2 MG/2ML IJ SOLN
INTRAMUSCULAR | Status: AC
  Filled 2019-01-19: qty 2

## 2019-01-19 MED ORDER — DEXAMETHASONE SODIUM PHOSPHATE 10 MG/ML IJ SOLN
INTRAMUSCULAR | Status: AC
  Filled 2019-01-19: qty 1

## 2019-01-19 MED ORDER — ONDANSETRON HCL 4 MG/2ML IJ SOLN
INTRAMUSCULAR | Status: AC
  Filled 2019-01-19: qty 2

## 2019-01-19 MED ORDER — HYDROMORPHONE HCL 1 MG/ML IJ SOLN: 0.2500 mg | INTRAMUSCULAR | Status: DC | PRN

## 2019-01-19 SURGICAL SUPPLY — 60 items
ADH SKN CLS APL DERMABOND .7 (GAUZE/BANDAGES/DRESSINGS) ×2
ADH SKN CLS LQ APL DERMABOND (GAUZE/BANDAGES/DRESSINGS) ×2
ARMBAND PINK RESTRICT EXTREMIT (MISCELLANEOUS) ×6 IMPLANT
BAG DECANTER FOR FLEXI CONT (MISCELLANEOUS) ×4 IMPLANT
BIOPATCH RED 1 DISK 7.0 (GAUZE/BANDAGES/DRESSINGS) ×3 IMPLANT
BIOPATCH RED 1IN DISK 7.0MM (GAUZE/BANDAGES/DRESSINGS) ×1
CANISTER SUCT 3000ML PPV (MISCELLANEOUS) ×4 IMPLANT
CANNULA VESSEL 3MM 2 BLNT TIP (CANNULA) ×4 IMPLANT
CATH PALINDROME RT-P 15FX19CM (CATHETERS) IMPLANT
CATH PALINDROME RT-P 15FX23CM (CATHETERS) ×2 IMPLANT
CATH PALINDROME RT-P 15FX28CM (CATHETERS) IMPLANT
CATH PALINDROME RT-P 15FX55CM (CATHETERS) IMPLANT
CLIP LIGATING EXTRA MED SLVR (CLIP) ×4 IMPLANT
CLIP LIGATING EXTRA SM BLUE (MISCELLANEOUS) ×4 IMPLANT
COVER PROBE W GEL 5X96 (DRAPES) ×4 IMPLANT
COVER SURGICAL LIGHT HANDLE (MISCELLANEOUS) ×4 IMPLANT
COVER WAND RF STERILE (DRAPES) ×4 IMPLANT
DECANTER SPIKE VIAL GLASS SM (MISCELLANEOUS) ×4 IMPLANT
DERMABOND ADHESIVE PROPEN (GAUZE/BANDAGES/DRESSINGS) ×2
DERMABOND ADVANCED (GAUZE/BANDAGES/DRESSINGS) ×2
DERMABOND ADVANCED .7 DNX12 (GAUZE/BANDAGES/DRESSINGS) ×2 IMPLANT
DERMABOND ADVANCED .7 DNX6 (GAUZE/BANDAGES/DRESSINGS) IMPLANT
DRAPE C-ARM 42X72 X-RAY (DRAPES) ×4 IMPLANT
DRAPE CHEST BREAST 15X10 FENES (DRAPES) ×4 IMPLANT
ELECT REM PT RETURN 9FT ADLT (ELECTROSURGICAL) ×4
ELECTRODE REM PT RTRN 9FT ADLT (ELECTROSURGICAL) ×2 IMPLANT
GAUZE 4X4 16PLY RFD (DISPOSABLE) ×4 IMPLANT
GLOVE BIOGEL PI IND STRL 6.5 (GLOVE) IMPLANT
GLOVE BIOGEL PI INDICATOR 6.5 (GLOVE) ×2
GLOVE SS BIOGEL STRL SZ 7.5 (GLOVE) ×2 IMPLANT
GLOVE SUPERSENSE BIOGEL SZ 7.5 (GLOVE) ×2
GOWN STRL REUS W/ TWL LRG LVL3 (GOWN DISPOSABLE) ×6 IMPLANT
GOWN STRL REUS W/TWL LRG LVL3 (GOWN DISPOSABLE) ×12
GRAFT GORETEX STRT 4-7X45 (Vascular Products) ×2 IMPLANT
KIT BASIN OR (CUSTOM PROCEDURE TRAY) ×4 IMPLANT
KIT TURNOVER KIT B (KITS) ×4 IMPLANT
NDL 18GX1X1/2 (RX/OR ONLY) (NEEDLE) ×2 IMPLANT
NDL HYPO 25GX1X1/2 BEV (NEEDLE) ×2 IMPLANT
NEEDLE 18GX1X1/2 (RX/OR ONLY) (NEEDLE) ×4 IMPLANT
NEEDLE 22X1 1/2 (OR ONLY) (NEEDLE) IMPLANT
NEEDLE HYPO 25GX1X1/2 BEV (NEEDLE) ×4 IMPLANT
NS IRRIG 1000ML POUR BTL (IV SOLUTION) ×4 IMPLANT
PACK CV ACCESS (CUSTOM PROCEDURE TRAY) ×4 IMPLANT
PACK SURGICAL SETUP 50X90 (CUSTOM PROCEDURE TRAY) ×4 IMPLANT
PAD ARMBOARD 7.5X6 YLW CONV (MISCELLANEOUS) ×8 IMPLANT
SOAP 2 % CHG 4 OZ (WOUND CARE) ×4 IMPLANT
SUT ETHILON 3 0 PS 1 (SUTURE) ×4 IMPLANT
SUT PROLENE 6 0 CC (SUTURE) ×6 IMPLANT
SUT SILK 2 0 SH (SUTURE) ×2 IMPLANT
SUT VIC AB 3-0 SH 27 (SUTURE) ×4
SUT VIC AB 3-0 SH 27X BRD (SUTURE) ×2 IMPLANT
SUT VICRYL 4-0 PS2 18IN ABS (SUTURE) ×4 IMPLANT
SYR 10ML LL (SYRINGE) ×4 IMPLANT
SYR 20CC LL (SYRINGE) ×4 IMPLANT
SYR 5ML LL (SYRINGE) ×8 IMPLANT
SYR CONTROL 10ML LL (SYRINGE) ×4 IMPLANT
TOWEL GREEN STERILE (TOWEL DISPOSABLE) ×8 IMPLANT
TOWEL GREEN STERILE FF (TOWEL DISPOSABLE) ×4 IMPLANT
UNDERPAD 30X30 (UNDERPADS AND DIAPERS) ×4 IMPLANT
WATER STERILE IRR 1000ML POUR (IV SOLUTION) ×4 IMPLANT

## 2019-01-19 NOTE — Progress Notes (Signed)
PROGRESS NOTE  KEIGAN GIRTEN EHM:[id number] DOB: [birth year] DOA: 01/11/2019 PCP: Clent Demark, PA-C   LOS: 7 days   Brief narrative:  Rhonda Lynch is a 63 y.o. female with medical history significant of ESRD-HD (TTS), DM, HTN, stroke, GERD, HCV, sCHF, polysubstance abuse (tobacco, alcohol, heroin, cocaine), chronic elevation of troponin, presented to hospital with chest pain and shortness of breath. ED Course: pt was found to have Negative COVID-19 test, D-dimer 9.63, troponin 0.06, WBC 9.2 (CBC showed shcistocytes, pending smear) potassium 5.4, bicarbonate 18, creatinine 16.9, BUN 116, temperature normal, tachycardia, tachypnea, oxygen saturation 70s at RA per EMS-->100 on 4L nasal cannula oxygen, chest x-ray showed cardiomegaly and pulmonary edema.      Patient was then admitted to hospital and received hemodialysis through temporary access due to thrombosis of the left AV graft.  Nephrology on vascular surgery followed the patient and patient and placement of right internal jugular hemodialysis catheter on 01/19/2019.  Subjective: Seen during hemodialysis after right internal jugular hemodialysis catheter placement.  Assessment/Plan:  Principal Problem:   Acute pulmonary edema (HCC) Active Problems:   TOBACCO ABUSE   Alcohol abuse   Cocaine abuse (HCC)   Elevated troponin   Hypertension   Noncompliance   Acute respiratory failure with hypoxia (HCC)   Insulin dependent diabetes mellitus (HCC)   Chest pain   End-stage renal disease on hemodialysis (HCC)   Hyperkalemia   Acute on chronic systolic CHF (congestive heart failure) (HCC)   Fluid overload   Acute metabolic encephalopathy  Chest pain -improved. Atypical in nature.  CT angiogram was negative for pulmonary embolism or dissection.  Troponins are mildly elevated but flat.  Chronic elevation of troponins.  Echocardiogram was negative for acute findings.  Protonix for possible esophagitis.  Left arm pain.  Thrombosed  AV graft. Seen by vascular surgery.  Status post  right external jugular temporary dialysis catheter placement on 01/14/2019.  Status post tunneled hemodialysis catheter placement today.    Acute hypoxic respiratory failure/acute on chronic combined chf due to missing dialysis and volume overload. Resolved at this time.  X-ray showed pulmonary edema on presentation.  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   Acute metabolic encephalopathy, on presentation Likely secondary to intoxication from cocaine/uremia and hypoxia.  Resolved.  No signs of withdrawal noted  Nutrition.  Dysphagia III diet/thin liquid   ESRD TTS Noncompliance, missed HD for a week prior to admission.  Nephrology on board.  Continue hemodialysis during hospitalization.  Essential HTN:  On  clonidine patch. Resume amlodipine from home.  Blood pressure is relatively controlled.  Diabetes mellitus type 2. Continue diabetic diet, sliding scale insulin. Will closely monitor.  Microcytic anemia Stable at this time.  Polysubstance abuse Long history of  cocaine use.  Supportive care.  No signs of withdrawal  VTE Prophylaxis: Heparin subcu  Code Status: Full code  Family Communication: None today  Disposition Plan: Home tomorrow.  Hemodialysis today after vascular intervention.  Consultants:  Nephrology, critical care, vascular surgery  Procedures:  BiPAP  Dialysis  Right external jugular temporary dialysis catheter placement on 01/14/2019.  right internal jugular hemodialysis catheter on 01/19/2019.   Antibiotics: Anti-infectives (From admission, onward)   Start     Dose/Rate Route Frequency Ordered Stop   01/19/19 0000  ceFAZolin (ANCEF) IVPB 1 g/50 mL premix    Note to Pharmacy:  Send with pt to OR   1 g 100 mL/hr over 30 Minutes Intravenous On call 01/16/19  1021 01/19/19 0815      Objective: Vitals:   01/19/19 1111 01/19/19 1116  BP: (!) (P) 161/79 (!) (P)  162/77  Pulse: (P) 79 (P) 71  Resp: (P) 14 (P) 15  Temp:    SpO2:      Intake/Output Summary (Last 24 hours) at 01/19/2019 1250 Last data filed at 01/19/2019 0958 Gross per 24 hour  Intake 1320 ml  Output 25 ml  Net 1295 ml   Filed Weights   01/15/19 1550 01/17/19 0700 01/19/19 1100  Weight: 53.9 kg 53.8 kg (P) 55.1 kg   Body mass index is 22.95 kg/m (pended).   Physical Exam: General:  Average built, not in obvious distress, chronically ill HENT: Normocephalic, pupils equally reacting to light and accommodation.  No scleral pallor or icterus noted. Oral mucosa is moist.     Chest:  Clear breath sounds.  Diminished breath sounds bilaterally. No crackles or wheezes.  CVS: S1 &S2 heard. No murmur.  Regular rate and rhythm. Abdomen: Soft, nontender, nondistended.  Bowel sounds are heard.  Liver is not palpable, no abdominal mass palpated Extremities: No cyanosis, clubbing or edema.  Peripheral pulses are palpable.  Left upper extremity AV graft without bruit, right internal jugular hemodialysis catheter, AV fistula. Psych: Alert, awake and oriented, normal mood CNS:  No cranial nerve deficits.  Power equal in all extremities.  No sensory deficits noted.  No cerebellar signs.   Skin: Warm and dry.  No rashes noted.  Data Review: I have personally reviewed the following laboratory data and studies,  CBC: Recent Labs  Lab 01/13/19 0208 01/14/19 0738 01/15/19 1204 01/17/19 0222 01/19/19 0257  WBC 7.2 8.3 7.6 7.4 10.4  HGB 8.4* 7.4* 7.9* 8.0* 8.1*  HCT 26.5* 23.3* 25.7* 25.8* 26.3*  MCV 76.4* 73.5* 76.0* 75.4* 76.9*  PLT 262 227 209 200 001   Basic Metabolic Panel: Recent Labs  Lab 01/14/19 2221 01/15/19 1204 01/17/19 0222 01/17/19 0705 01/19/19 0257  NA 133* 135 132* 132* 131*  K 4.3 4.9 5.1 5.4* 5.8*  CL 97* 94* 93* 91* 91*  CO2 26 31 26 29 28   GLUCOSE 202* 145* 113* 110* 118*  BUN 22 33* 31* 32* 36*  CREATININE 5.25* 6.99* 6.19* 6.61* 6.65*  CALCIUM 7.6* 7.9*  8.6* 8.6* 8.8*  PHOS  --  4.9*  --  4.1  --    Liver Function Tests: Recent Labs  Lab 01/15/19 1204 01/17/19 0705  ALBUMIN 2.1* 2.1*   No results for input(s): LIPASE, AMYLASE in the last 168 hours. No results for input(s): AMMONIA in the last 168 hours. Cardiac Enzymes: No results for input(s): CKTOTAL, CKMB, CKMBINDEX, TROPONINI in the last 168 hours. BNP (last 3 results) Recent Labs    02/12/18 1534 06/20/18 2042 12/03/18 1209  BNP 225.7* >4,500.0* >4,500.0*    ProBNP (last 3 results) No results for input(s): PROBNP in the last 8760 hours.  CBG: Recent Labs  Lab 01/18/19 1100 01/18/19 1635 01/18/19 2207 01/19/19 0627 01/19/19 1023  GLUCAP 133* 168* 170* 108* 105*   Recent Results (from the past 240 hour(s))  SARS Coronavirus 2 (CEPHEID- Performed in [place] hospital lab), Hosp Order     Status: None   Collection Time: 01/11/19  7:57 PM  Result Value Ref Range Status   SARS Coronavirus 2 NEGATIVE NEGATIVE Final    Comment: (NOTE) If result is NEGATIVE SARS-CoV-2 target nucleic acids are NOT DETECTED. The SARS-CoV-2 RNA is generally detectable in upper and lower  respiratory  specimens during the acute phase of infection. The lowest  concentration of SARS-CoV-2 viral copies this assay can detect is 250  copies / mL. A negative result does not preclude SARS-CoV-2 infection  and should not be used as the sole basis for treatment or other  patient management decisions.  A negative result may occur with  improper specimen collection / handling, submission of specimen other  than nasopharyngeal swab, presence of viral mutation(s) within the  areas targeted by this assay, and inadequate number of viral copies  (<250 copies / mL). A negative result must be combined with clinical  observations, patient history, and epidemiological information. If result is POSITIVE SARS-CoV-2 target nucleic acids are DETECTED. The SARS-CoV-2 RNA is generally detectable in upper  and lower  respiratory specimens dur ing the acute phase of infection.  Positive  results are indicative of active infection with SARS-CoV-2.  Clinical  correlation with patient history and other diagnostic information is  necessary to determine patient infection status.  Positive results do  not rule out bacterial infection or co-infection with other viruses. If result is PRESUMPTIVE POSTIVE SARS-CoV-2 nucleic acids MAY BE PRESENT.   A presumptive positive result was obtained on the submitted specimen  and confirmed on repeat testing.  While 2019 novel coronavirus  (SARS-CoV-2) nucleic acids may be present in the submitted sample  additional confirmatory testing may be necessary for epidemiological  and / or clinical management purposes  to differentiate between  SARS-CoV-2 and other Sarbecovirus currently known to infect humans.  If clinically indicated additional testing with an alternate test  methodology [phone]) is advised. The SARS-CoV-2 RNA is generally  detectable in upper and lower respiratory sp ecimens during the acute  phase of infection. The expected result is Negative. Fact Sheet for Patients:  StrictlyIdeas.no Fact Sheet for Healthcare Providers: BankingDealers.co.za This test is not yet approved or cleared by the [place] FDA and has been authorized for detection and/or diagnosis of SARS-CoV-2 by FDA under an Emergency Use Authorization (EUA).  This EUA will remain in effect (meaning this test can be used) for the duration of the COVID-19 declaration under Section 564(b)(1) of the Act, 21 U.S.C. section 360bbb-3(b)(1), unless the authorization is terminated or revoked sooner. Performed at [facility], [place] [address] [place],  [address]      Studies: Dg Chest Port 1 View  Result Date: 01/19/2019 CLINICAL DATA:  Catheter placement. EXAM: PORTABLE CHEST 1 VIEW COMPARISON:  Radiograph of Jan 12, 2019. FINDINGS: Stable cardiomegaly. Atherosclerosis of thoracic aorta is noted. Stable interstitial densities are noted throughout both lungs concerning for edema. No pneumothorax or pleural effusion is noted. Minimal right basilar subsegmental atelectasis is noted. Interval placement of right internal jugular dialysis catheter with distal tip in right atrium. Bony thorax is unremarkable. IMPRESSION: Interval placement of right internal jugular dialysis catheter with distal tip in right atrium. No pneumothorax is noted. Stable cardiomegaly with probable bilateral pulmonary edema. Aortic Atherosclerosis (ICD10-I70.0). Electronically Signed   By: Marijo Conception M.D.   On: 01/19/2019 10:18   Dg Fluoro Guide Cv Line-no Report  Result Date: 01/19/2019 Fluoroscopy was utilized by the requesting physician.  No radiographic interpretation.    Scheduled Meds: . amLODipine  5 mg Oral Daily  . aspirin EC  81 mg Oral Daily  . Chlorhexidine Gluconate Cloth  6 each Topical Q0600  . Chlorhexidine Gluconate Cloth  6 each Topical Q0600  . cloNIDine  0.1 mg Transdermal Weekly  . darbepoetin (  ARANESP) injection - DIALYSIS  150 mcg Intravenous Q Sat-HD  . doxercalciferol  5 mcg Intravenous Q T,Th,Sa-HD  . feeding supplement (NEPRO CARB STEADY)  237 mL Oral BID BM  . fluticasone  2 spray Each Nare Daily  . folic acid  1 mg Oral Daily  . gabapentin  100 mg Oral TID  . guaiFENesin  600 mg Oral BID  . hydrocortisone cream  1 application Topical BID  . insulin aspart  0-9 Units Subcutaneous TID WC  . multivitamin with minerals  1 tablet Oral Daily  . nicotine  21 mg Transdermal Daily  . pantoprazole  40 mg Oral Daily  . [START ON 01/20/2019] pneumococcal 23 valent vaccine  0.5 mL Intramuscular Tomorrow-1000  . senna-docusate  1 tablet Oral BID  . sevelamer carbonate  1,600 mg Oral TID WC  . thiamine  100 mg Oral Daily   Or  . thiamine  100 mg Intravenous Daily    Continuous Infusions: . sodium chloride 10  mL/hr at 01/19/19 0751  . ferric gluconate (FERRLECIT/NULECIT) IV Stopped (01/17/19 1123)     Flora Lipps, MD  [facility] 01/19/2019

## 2019-01-19 NOTE — Anesthesia Postprocedure Evaluation (Signed)
Anesthesia Post Note  Patient: VIRDIE PENNING  Procedure(s) Performed: INSERTION OF HEMO DIALYSIS  PALINDROME TUNNELED CATHETER (Right Chest) INSERTION OF ARTERIOVENOUS GRAFT RIGHT ARM (Right Arm Lower)     Patient location during evaluation: PACU Anesthesia Type: MAC Level of consciousness: awake and alert Pain management: pain level controlled Vital Signs Assessment: post-procedure vital signs reviewed and stable Respiratory status: spontaneous breathing, nonlabored ventilation and respiratory function stable Cardiovascular status: stable and blood pressure returned to baseline Postop Assessment: no apparent nausea or vomiting Anesthetic complications: no    Last Vitals:  Vitals:   01/19/19 1029 01/19/19 1030  BP: (!) 150/69   Pulse: 82 76  Resp: 20 15  Temp:    SpO2: 98% 94%    Last Pain:  Vitals:   01/19/19 0359  TempSrc: Axillary  PainSc:                  Shaleigh Laubscher,W. EDMOND

## 2019-01-19 NOTE — Transfer of Care (Signed)
Immediate Anesthesia Transfer of Care Note  Patient: Rhonda Lynch  Procedure(s) Performed: INSERTION OF HEMO DIALYSIS  PALINDROME TUNNELED CATHETER (Right Chest) INSERTION OF ARTERIOVENOUS GRAFT RIGHT ARM (Right Arm Lower)  Patient Location: PACU  Anesthesia Type:MAC  Level of Consciousness: drowsy  Airway & Oxygen Therapy: Patient Spontanous Breathing and Patient connected to face mask oxygen  Post-op Assessment: Report given to RN, Post -op Vital signs reviewed and stable and Patient moving all extremities  Post vital signs: Reviewed and stable  Last Vitals:  Vitals Value Taken Time  BP 147/68 01/19/2019 10:13 AM  Temp    Pulse 76 01/19/2019 10:16 AM  Resp 13 01/19/2019 10:16 AM  SpO2 100 % 01/19/2019 10:16 AM  Vitals shown include unvalidated device data.  Last Pain:  Vitals:   01/19/19 0359  TempSrc: Axillary  PainSc:       Patients Stated Pain Goal: 1 (16/10/96 0454)  Complications: No apparent anesthesia complications

## 2019-01-19 NOTE — Progress Notes (Signed)
PT Cancellation Note  Patient Details Name: Rhonda Lynch MRN: [id number] DOB: [birth year]   Cancelled Treatment:    Reason Eval/Treat Not Completed: Patient at procedure or test/unavailable(Pt in HD.  Will return tomorrow. )   [place] 01/19/2019, 11:07 AM [place] Pager:  [phone]  Office:  [phone]

## 2019-01-19 NOTE — Op Note (Signed)
OPERATIVE REPORT  DATE OF SURGERY: 01/19/2019  PATIENT: Rhonda Lynch, 63 y.o. female MRN: [id number]  DOB: [birth year]  PRE-OPERATIVE DIAGNOSIS: End-stage renal disease  POST-OPERATIVE DIAGNOSIS:  Same  PROCEDURE: #1 right EJ tunneled hemodialysis catheter, #2 right forearm loop AV Gore-Tex graft  SURGEON:  [name], M.D.  PHYSICIAN ASSISTANT: Laurence Slate, PA-C  ANESTHESIA: Local with sedation  EBL: per anesthesia record  Total I/O In: 500 [I.V.:500] Out: 25 [Blood:25]  BLOOD ADMINISTERED: none  DRAINS: none  SPECIMEN: none  COUNTS CORRECT:  YES  PATIENT DISPOSITION:  PACU - hemodynamically stable  PROCEDURE DETAILS: Patient was taken the operating placed supine xiphoid area of the right and left neck and chest prepped draped you sterile fashion.  The patient had a temporary right EJ catheter.  In reading the interventional radiologist note, they were unsuccessful in placing a guidewire through the right internal jugular vein.  The temporary catheter was prepped into the field.  A guidewire passed easily down the EJ catheter and was down to the level of right atrium this was confirmed with fluoroscopy.  The catheter was removed.  A dilator and peel-away sheath was passed over the guidewire and the dilator and guidewire removed.  The 23 cm catheter was passed through the peel-away sheath and the peel-away sheath was removed.  The tips were placed in the distal right atrium.  Separate stab incision was made and the catheter was brought through a subcutaneous tunnel.  The 2 lm ports were attached and both lumens flushed and aspirated easily and were locked with 1000/cc heparin.  The catheter was secured the skin with a 3-0 nylon stitch and the entry site was closed with a 4-0 subcuticular Vicryl stitch.  Sterile dressing was applied.  Attention was then turned to the right arm.  The right arm was imaged with SonoSite ultrasound.  The patient had a very small cephalic vein.   Patient did have a moderate sized basilic vein but there was an IV that had been placed in the mid basilic vein and there was partial thrombus here.  Decision was made to place a forearm loop graft since there was a good caliber brachial vein at the antecubital space.  Using local anesthesia incision was made over the antecubital space and carried down to isolate the brachial artery and brachial vein.  The artery was relatively small but had an excellent pulse and minimal atherosclerotic change.  A separate incision was made over the distal forearm in a loop configuration was tunneled.  A 4 x 7 Gore-Tex graft was brought through the tunnel.  The artery was occluded proximally distally and was opened with 11 blade and sent longitudinally with Potts scissors.  A small arteriotomy was created.  The graft was spatulated at approximately 5 mm in diameter size and was sewn end-to-side to the artery with a running 6-0 Prolene suture.  Anastomosis was tested and found to be adequate.  The graft was flushed with heparinized saline and reoccluded.  Next the brachial vein was occluded proximally distally was opened 11 blade sent arterial pot scissors.  The graft cut the appropriate length and was spatulated and sewn end-to-side to the vein with a running 6-0 Prolene suture.  Clamps removed and excellent thrill was noted.  Wounds irrigated with saline.  Hemostasis to electrocautery.  Wounds were closed with 3-0 Vicryl in the subcutaneous and subcuticular tissue.  Sterile dressing was applied.  The patient maintained a right radial pulse.  Was transferred to  the recovery room in stable condition   [name], M.D., [facility] 01/19/2019 10:13 AM

## 2019-01-19 NOTE — Anesthesia Preprocedure Evaluation (Addendum)
Anesthesia Evaluation  Patient identified by MRN, date of birth, ID band Patient awake    Reviewed: Allergy & Precautions, H&P , NPO status , Patient's Chart, lab work & pertinent test results  Airway Mallampati: II  TM Distance: >3 FB Neck ROM: Full    Dental no notable dental hx. (+) Teeth Intact, Dental Advisory Given   Pulmonary Current Smoker,    Pulmonary exam normal breath sounds clear to auscultation       Cardiovascular hypertension, Pt. on medications +CHF   Rhythm:Regular Rate:Normal     Neuro/Psych CVA negative psych ROS   GI/Hepatic negative GI ROS, (+) Hepatitis -, C  Endo/Other  diabetes, Type 1, Insulin Dependent  Renal/GU ESRF and DialysisRenal disease  negative genitourinary   Musculoskeletal   Abdominal   Peds  Hematology  (+) Blood dyscrasia, anemia ,   Anesthesia Other Findings   Reproductive/Obstetrics negative OB ROS                            Anesthesia Physical Anesthesia Plan  ASA: III  Anesthesia Plan: MAC   Post-op Pain Management:    Induction: Intravenous  PONV Risk Score and Plan: 2 and Propofol infusion and Ondansetron  Airway Management Planned: Simple Face Mask  Additional Equipment:   Intra-op Plan:   Post-operative Plan:   Informed Consent: I have reviewed the patients History and Physical, chart, labs and discussed the procedure including the risks, benefits and alternatives for the proposed anesthesia with the patient or authorized representative who has indicated his/her understanding and acceptance.     Dental advisory given  Plan Discussed with: CRNA  Anesthesia Plan Comments:         Anesthesia Quick Evaluation

## 2019-01-19 NOTE — Progress Notes (Signed)
HD tx terminated with 2hrs remaining as ordered by nephrologist. Early termination against medical advice signed, pt alert/oriented in no apparent distress. VSS. Report given to primary nurse.

## 2019-01-19 NOTE — Progress Notes (Signed)
PT Cancellation Note  Patient Details Name: Rhonda Lynch MRN: [id number] DOB: 1956-03-12   Cancelled Treatment:    Reason Eval/Treat Not Completed: Patient at procedure or test/unavailable(Pt in PACU.  Will return as able. )   Denice Paradise 01/19/2019, 10:38 AM  [facility] Pager:  [phone]  Office:  [phone]

## 2019-01-19 NOTE — Progress Notes (Signed)
[facility] Progress Note    Assessment/ Plan:   [facility] T[facility] 4h 400/800 54kg 2/2.25 bath Hep 3000 units IV TIWLUA AVG - hectorol 5 mcg IV TIW - venofer 100 mg IV X 5 doses 1/5 given 01/03/19 - mircera 100 mg every 2 wks , (last dose 100 mcg IV 12/25/18)  Problem/Plan: 1. Volume overload/Acute respiratory failure D/T noncompliance with HD. May need to lower dry weight on discharge  2. AMS-possible withdrawal, uremia.now @ baseline. Holding sedating meds. Per primary. 3. Chest pain -trop flat,EKG neg and CTA chest negative for PE (old pe) 4. ESRD on HD: Normally TTS as outpatient.  Plan for HD today given hyperkalemia.  Next HD would then be Wed unless hyperkalemia 5. Clotted LUA AVGG =Had L AVG declot w/ arterial revision on 12/11/18.Unable to HDon admitas she was thrombosed/  IRplaced Temp R IJ HD cath.  Dr Donzetta Matters consulted =plan for Tunneled HD cath and New R arm access today Diarrhea-H/O C diff colitis.  Per primary 6. HTN-acceptable.  On norvasc and clonidine and optimizing volume with HD 7. Drug abuse - not currently using drugsper pt 8. Smoker - trying to quit, has nicotine patch on 9. Anemia ckd - Dosed aranesp 150 x1 (5/6)  10. BMD-Ca /phos stable . Continue binders, VDRA. Phos 4.1 5/9 11. Hep C 12. H/o CVA 29. DM = per admit team   Subjective:   Pt agrees to have HD today but isn't willing to go tomorrow too in order to get back on schedule.  Spoke with OR and they are ok if K under 6.0.  She can have HD after procedure today - having graft and HD tunneled line placed.   Review of systems: denies shortness of breath or chest pain.  Denies nausea or vomiting   Objective:   BP (!) 148/77 (BP Location: Right Wrist)   Pulse 84   Temp 97.7 F (36.5 C) (Axillary)   Resp 14   Wt 53.8 kg   SpO2 98%   BMI 22.41 kg/m   Intake/Output Summary (Last 24 hours) at 01/19/2019 1610 Last data filed at 01/19/2019 0100 Gross per 24 hour  Intake 1300  ml  Output 0 ml  Net 1300 ml   Weight change:   Physical Exam: General:Chronically ill appearing female in NAD Heart: RRR; no rub Lungs:clear and unlabored on room air Abdomen:Active BSsoft , Nt, ND Extremities:no edema lower extremities  Dialysis Access: Rt EJ Temp Cath / [id number]   Imaging: No results found.  Labs: BMET Recent Labs  Lab 01/12/19 0828 01/14/19 0738 01/14/19 2221 01/15/19 1204 01/17/19 0222 01/17/19 0705 01/19/19 0257  NA 139 136 133* 135 132* 132* 131*  K 4.5 6.2* 4.3 4.9 5.1 5.4* 5.8*  CL 101 100 97* 94* 93* 91* 91*  CO2 23 26 26 31 26 29 28   GLUCOSE 90 110* 202* 145* 113* 110* 118*  BUN 51* 82* 22 33* 31* 32* 36*  CREATININE 10.01* 12.82* 5.25* 6.99* 6.19* 6.61* 6.65*  CALCIUM 7.9* 7.7* 7.6* 7.9* 8.6* 8.6* 8.8*  PHOS  --   --   --  4.9*  --  4.1  --    CBC Recent Labs  Lab 01/14/19 0738 01/15/19 1204 01/17/19 0222 01/19/19 0257  WBC 8.3 7.6 7.4 10.4  HGB 7.4* 7.9* 8.0* 8.1*  HCT 23.3* 25.7* 25.8* 26.3*  MCV 73.5* 76.0* 75.4* 76.9*  PLT 227 209 200 170    Medications:    . amLODipine  5 mg Oral  Daily  . aspirin EC  81 mg Oral Daily  . Chlorhexidine Gluconate Cloth  6 each Topical Q0600  . cloNIDine  0.1 mg Transdermal Weekly  . darbepoetin (ARANESP) injection - DIALYSIS  150 mcg Intravenous Q Sat-HD  . doxercalciferol  5 mcg Intravenous Q T,Th,Sa-HD  . feeding supplement (NEPRO CARB STEADY)  237 mL Oral BID BM  . fluticasone  2 spray Each Nare Daily  . folic acid  1 mg Oral Daily  . gabapentin  100 mg Oral TID  . guaiFENesin  600 mg Oral BID  . hydrocortisone cream  1 application Topical BID  . insulin aspart  0-9 Units Subcutaneous TID WC  . multivitamin with minerals  1 tablet Oral Daily  . nicotine  21 mg Transdermal Daily  . pantoprazole  40 mg Oral Daily  . senna-docusate  1 tablet Oral BID  . sevelamer carbonate  1,600 mg Oral TID WC  . thiamine  100 mg Oral Daily   Or  . thiamine  100 mg  Intravenous Daily    Claudia Desanctis 01/19/2019, 6:43 AM

## 2019-01-19 NOTE — Interval H&P Note (Signed)
History and Physical Interval Note:  01/19/2019 7:39 AM  Rhonda Lynch  has presented today for surgery, with the diagnosis of END STAGE RENAL DISEASE FOR HEMODIALYSIS ACCESS.  The various methods of treatment have been discussed with the patient and family. After consideration of risks, benefits and other options for treatment, the patient has consented to  Procedure(s): INSERTION OF DIALYSIS TUNNELEDCATHETER (N/A) ARTERIOVENOUS (AV) FISTULA CREATION VERSUS INSERTION OF ARTERIOVENOUS GRAFT RIGHT ARM (Right) as a surgical intervention.  [name] history has been reviewed, patient examined, no change in status, stable for surgery.  I have reviewed the patient's chart and labs.  Questions were answered to the patient's satisfaction.     [name]

## 2019-01-19 NOTE — Plan of Care (Signed)
  Problem: Education: Goal: Knowledge of General Education information will improve Description: Including pain rating scale, medication(s)/side effects and non-pharmacologic comfort measures Outcome: Progressing   Problem: Health Behavior/Discharge Planning: Goal: Ability to manage health-related needs will improve Outcome: Progressing   Problem: Clinical Measurements: Goal: Ability to maintain clinical measurements within normal limits will improve Outcome: Progressing Goal: Will remain free from infection Outcome: Progressing Goal: Diagnostic test results will improve Outcome: Progressing Goal: Respiratory complications will improve Outcome: Progressing Goal: Cardiovascular complication will be avoided Outcome: Progressing   Problem: Pain Managment: Goal: General experience of comfort will improve Outcome: Progressing   Problem: Safety: Goal: Ability to remain free from injury will improve Outcome: Progressing   

## 2019-01-20 ENCOUNTER — Telehealth: Payer: Self-pay

## 2019-01-20 ENCOUNTER — Encounter (HOSPITAL_COMMUNITY): Payer: Self-pay | Admitting: Vascular Surgery

## 2019-01-20 LAB — CBC WITH DIFFERENTIAL/PLATELET
Abs Immature Granulocytes: 0.05 10*3/uL (ref 0.00–0.07)
Basophils Absolute: 0 10*3/uL (ref 0.0–0.1)
Basophils Relative: 0 %
Eosinophils Absolute: 0.1 10*3/uL (ref 0.0–0.5)
Eosinophils Relative: 1 %
HCT: 25.2 % — ABNORMAL LOW (ref 36.0–46.0)
Hemoglobin: 7.5 g/dL — ABNORMAL LOW (ref 12.0–15.0)
Immature Granulocytes: 1 %
Lymphocytes Relative: 20 %
Lymphs Abs: 2 10*3/uL (ref 0.7–4.0)
MCH: 23.4 pg — ABNORMAL LOW (ref 26.0–34.0)
MCHC: 29.8 g/dL — ABNORMAL LOW (ref 30.0–36.0)
MCV: 78.5 fL — ABNORMAL LOW (ref 80.0–100.0)
Monocytes Absolute: 1.5 10*3/uL — ABNORMAL HIGH (ref 0.1–1.0)
Monocytes Relative: 15 %
Neutro Abs: 6.2 10*3/uL (ref 1.7–7.7)
Neutrophils Relative %: 63 %
Platelets: 159 10*3/uL (ref 150–400)
RBC: 3.21 MIL/uL — ABNORMAL LOW (ref 3.87–5.11)
RDW: 21.9 % — ABNORMAL HIGH (ref 11.5–15.5)
WBC: 9.9 10*3/uL (ref 4.0–10.5)
nRBC: 0.4 % — ABNORMAL HIGH (ref 0.0–0.2)

## 2019-01-20 LAB — GLUCOSE, CAPILLARY
Glucose-Capillary: 151 mg/dL — ABNORMAL HIGH (ref 70–99)
Glucose-Capillary: 126 mg/dL — ABNORMAL HIGH (ref 70–99)

## 2019-01-20 LAB — RENAL FUNCTION PANEL
Albumin: 2.1 g/dL — ABNORMAL LOW (ref 3.5–5.0)
Anion gap: 8 (ref 5–15)
BUN: 41 mg/dL — ABNORMAL HIGH (ref 8–23)
CO2: 27 mmol/L (ref 22–32)
Calcium: 8.5 mg/dL — ABNORMAL LOW (ref 8.9–10.3)
Chloride: 96 mmol/L — ABNORMAL LOW (ref 98–111)
Creatinine, Ser: 6.46 mg/dL — ABNORMAL HIGH (ref 0.44–1.00)
GFR calc Af Amer: 7 mL/min — ABNORMAL LOW (ref >60)
GFR calc non Af Amer: 6 mL/min — ABNORMAL LOW (ref >60)
Glucose, Bld: 149 mg/dL — ABNORMAL HIGH (ref 70–99)
Phosphorus: 2.2 mg/dL — ABNORMAL LOW (ref 2.5–4.6)
Potassium: 4.9 mmol/L (ref 3.5–5.1)
Sodium: 131 mmol/L — ABNORMAL LOW (ref 135–145)

## 2019-01-20 MED ORDER — DOXERCALCIFEROL 4 MCG/2ML IV SOLN
INTRAVENOUS | Status: AC
  Administered 2019-01-20: 05:00:00 5 ug via INTRAVENOUS
  Filled 2019-01-20: qty 4

## 2019-01-20 MED ORDER — HEPARIN SODIUM (PORCINE) 1000 UNIT/ML IJ SOLN
INTRAMUSCULAR | Status: AC
  Administered 2019-01-20: 1000 [IU]
  Filled 2019-01-20: qty 4

## 2019-01-20 MED ORDER — SEVELAMER CARBONATE 800 MG PO TABS
800.0000 mg | ORAL_TABLET | Freq: Three times a day (TID) | ORAL | Status: DC
  Administered 2019-01-20: 800 mg via ORAL
  Filled 2019-01-20: qty 1

## 2019-01-20 MED ORDER — GABAPENTIN 100 MG PO CAPS: 100.0000 mg | ORAL_CAPSULE | Freq: Three times a day (TID) | ORAL | 2 refills | Status: DC

## 2019-01-20 MED ORDER — NICOTINE 21 MG/24HR TD PT24: 21.0000 mg | MEDICATED_PATCH | Freq: Every day | TRANSDERMAL | 0 refills | Status: DC

## 2019-01-20 MED ORDER — OXYCODONE HCL 5 MG PO TABS: 5.0000 mg | ORAL_TABLET | Freq: Four times a day (QID) | ORAL | 0 refills | Status: DC | PRN

## 2019-01-20 MED ORDER — HYDROCORTISONE 1 % EX CREA: 1.0000 "application " | TOPICAL_CREAM | Freq: Two times a day (BID) | CUTANEOUS | 0 refills | Status: AC

## 2019-01-20 MED ORDER — ASPIRIN 81 MG PO TBEC: 81.0000 mg | DELAYED_RELEASE_TABLET | Freq: Every day | ORAL | 2 refills | Status: DC

## 2019-01-20 NOTE — Telephone Encounter (Signed)
Call received from Elenor Quinones, RN CM requesting a hospital follow up appointment for the patient. Informed her that an appointment has been scheduled for 01/28/2019 @ 1010 and the provider will determine if it is an in-person or televisit and patient will be contacted.

## 2019-01-20 NOTE — Progress Notes (Signed)
PT Cancellation Note  Patient Details Name: REBACCA VOTAW MRN: [id number] DOB: Apr 20, 1956   Cancelled Treatment:    Reason Eval/Treat Not Completed: Patient declined, no reason specified. "No, will you all please just let me go home in peace."  Mabeline Caras, PT, DPT Acute Rehabilitation Services  Pager [phone] Office [place] 01/20/2019, 12:00 PM

## 2019-01-20 NOTE — Care Management Important Message (Signed)
Important Message  Patient Details  Name: Rhonda Lynch MRN: [id number] Date of Birth: April 13, 1956   Medicare Important Message Given:  Yes  Verbal consent     Orbie Pyo 01/20/2019, 2:38 PM

## 2019-01-20 NOTE — Discharge Summary (Signed)
Physician Discharge Summary  [name] TIR:[id number] DOB: [birth year] DOA: 01/11/2019  PCP: Clent Demark, PA-C  Admit date: 01/11/2019   Discharge date: 01/20/2019  Admitted From: Home  Discharge disposition: Home  Recommendations for Outpatient Follow-Up:   Follow up with your primary care provider in one week.   Discharge Diagnosis:   Principal Problem:   Acute pulmonary edema (HCC) Active Problems:   TOBACCO ABUSE   Alcohol abuse   Cocaine abuse (HCC)   Elevated troponin   Hypertension   Noncompliance   Acute respiratory failure with hypoxia (HCC)   Insulin dependent diabetes mellitus (HCC)   Chest pain   End-stage renal disease on hemodialysis (HCC)   Hyperkalemia   Acute on chronic systolic CHF (congestive heart failure) (HCC)   Fluid overload   Acute metabolic encephalopathy  Discharge Condition: Improved.  Diet recommendation:  Carbohydrate-modified.  Regular.  Wound care: None.  Code status: Full.   History of Present Illness:   Rhonda Solana Robertsis a 63 y.o.femalewith medical history significant ofESRD-HD (TTS), DM, HTN, stroke, GERD, HCV, systolic CHF,polysubstance abuse (tobacco, alcohol, heroin, cocaine), chronic elevation of troponin, presented to hospital with chest pain and shortness of breath. In the ED,pt was found to have NegativeCOVID-19 test, D-dimer 9.63,troponin 0.06, WBC 9.2(CBC showed shcistocytes, pending smear)potassium 5.4, bicarbonate 18, creatinine 16.9, BUN 116, temperature normal, tachycardia, tachypnea, oxygen saturation 70s at RA per EMS-->100 on 4Lnasal cannula oxygen,chest x-ray showed cardiomegaly and pulmonary edema.   Patient was then admitted to hospital and received hemodialysis through temporary access due to thrombosis of the left AV graft.  Nephrology and vascular surgery followed the patient and patient underwent placement of tunneled right internal jugular hemodialysis catheter and right upper extremity  AV graft on 01/19/2019.  Hospital Course:  Patient was admitted to hospital and following conditions were addressed during hospitalization,  Chest pain -improved. Atypical in nature.  CT angiogram was negative for pulmonary embolism or dissection.  Troponins were mildly elevated but flat.  Chronic elevation of troponins.  Echocardiogram was negative for acute findings.  Protonix for possible esophagitis.  Left arm pain.  Thrombosed AV graft. Seen by vascular surgery.  Status post  right external jugular temporary dialysis catheter placement on 01/14/2019.  Status post tunneled hemodialysis catheter placement on 5/11 with right upper extremity AV graft.  Has new hemodialysis access for outpatient hemodialysis..  Acute hypoxic respiratory failure/acute on chronic combined chf due to missing dialysis and volume overload.  Resolved with dialysis.  Acute metabolic encephalopathy, on presentation Likely secondary to substance abuse/uremia and hypoxia.  Resolved.  No signs of withdrawal during hospitalization   ESRD TTS Noncompliance, missed HD for a weekprior to admission.    Patient will be continued on hemodialysis on discharge.  She was advised to compliance with it.  Essential HTN:  Patient will continue clonidine and amlodipine for home.  Diabetes mellitus type 2. Patient will resume outpatient insulin regimen  Microcytic anemia Remained stable.  Polysubstance abuse Long history ofcocaine use.  Supportive care.    Counseling was done.  Disposition.  At this time, patient is stable for disposition home.  Patient will have to follow-up with vascular surgery as outpatient.  Medical Consultants:    Nephrology,   critical care,   vascular surgery  Subjective:   Today, patient feels okay.  Denies any shortness of breath, chest pain palpitation or fever.  Discharge Exam:   Vitals:   01/20/19 0015 01/20/19 0328  BP: (!) 164/81 (!) 152/62  Pulse: 98   Resp: 20 12    Temp: 98.5 F (36.9 C) 98.2 F (36.8 C)  SpO2: 100% 100%   Vitals:   01/19/19 1603 01/19/19 2021 01/20/19 0015 01/20/19 0328  BP: (!) 157/92 (!) 167/91 (!) 164/81 (!) 152/62  Pulse:  100 98   Resp: (!) 30 15 20 12   Temp: 98 F (36.7 C) 98.3 F (36.8 C) 98.5 F (36.9 C) 98.2 F (36.8 C)  TempSrc: Oral Oral Oral Axillary  SpO2:  96% 100% 100%  Weight:        General exam: Appears calm and comfortable ,Not in distress, chronically ill. HEENT:PERRL,Oral mucosa moist Respiratory system: Bilateral equal air entry, normal vesicular breath sounds, no wheezes or crackles  Cardiovascular system: S1 & S2 heard, RRR.  Gastrointestinal system: Abdomen is nondistended, soft and nontender. No organomegaly or masses felt. Normal bowel sounds heard. Central nervous system: Alert and oriented. No focal neurological deficits. Extremities: No edema, no clubbing ,no cyanosis, distal peripheral pulses palpable.  Left upper extremity thrombosed AV graft without bruit.  Right internal jugular hemodialysis catheter in place.  Right upper extremity AV graft with palpable thrill. Skin: No rashes, lesions or ulcers,no icterus ,no pallor MSK: Normal muscle bulk,tone ,power    Procedures:     BiPAP  Dialysis  Right external jugular temporary dialysis catheter placement on 01/14/2019.  right internal jugular hemodialysis catheter on 01/19/2019.  The results of significant diagnostics from this hospitalization (including imaging, microbiology, ancillary and laboratory) are listed below for reference.     Diagnostic Studies:   Ct Angio Chest Pe W Or Wo Contrast  Result Date: 01/12/2019 CLINICAL DATA:  Positive D-dimer. Intermediate probability for pulmonary embolism EXAM: CT ANGIOGRAPHY CHEST WITH CONTRAST TECHNIQUE: Multidetector CT imaging of the chest was performed using the standard protocol during bolus administration of intravenous contrast. Multiplanar CT image reconstructions and MIPs were  obtained to evaluate the vascular anatomy. CONTRAST:  51mL OMNIPAQUE IOHEXOL 350 MG/ML SOLN COMPARISON:  12/03/2018 FINDINGS: Cardiovascular: Cardiomegaly without pericardial effusion. Mild atherosclerotic calcification of the aorta. Satisfactory opacification of the pulmonary arteries. No typical acute pulmonary embolism is seen. There is a web in the left lower lobe pulmonary artery and irregular filling defect with under filling of downstream vessels and is segmental left lower lobe branch. These were seen on immediate prior. There is also blunting of anterior basal segment pulmonary arteries in the right lower lobe. Questionable posterior segment right upper lobe eccentric filling defect Mediastinum/Nodes: Negative for adenopathy or mass Lungs/Pleura: Small left pleural effusion with adjacent pulmonary opacification and volume loss. Airspace disease seen previously has significantly improved with mild generalized reticular appearance. There is mild centrilobular emphysema. Upper Abdomen: Negative Musculoskeletal: No acute finding.  Thoracic disc degeneration Review of the MIP images confirms the above findings. IMPRESSION: 1. Negative for acute pulmonary embolism. There is evidence of remote pulmonary emboli with web formation. 2. Improved aeration since 12/03/2018. 3. Small left pleural effusion. There is lower lobe atelectasis and/or scarring. Electronically Signed   By: Monte Fantasia M.D.   On: 01/12/2019 07:04   Dg Chest Port 1 View  Result Date: 01/12/2019 CLINICAL DATA:  Left chest pain.  Nausea. EXAM: PORTABLE CHEST 1 VIEW COMPARISON:  01/11/2019 and chest CT 01/12/2019 FINDINGS: Lungs are adequately inflated with mild focal opacification over the lateral right base unchanged which may be due to atelectasis or infection. Left base/retrocardiac opacification unchanged to slightly worse likely left effusion/atelectasis as seen on recent CT. Hazy bilateral interstitial  prominence suggesting interstitial  edema and less likely infection. Stable cardiomegaly. Remainder of the exam is unchanged. IMPRESSION: Cardiomegaly with stable hazy interstitial prominence bilaterally suggesting edema. Stable to slightly worsening small left effusion with associated basilar atelectasis. Mild focal opacification over the lateral right base which may be due to atelectasis or infection. Electronically Signed   By: Marin Olp M.D.   On: 01/12/2019 10:30   Dg Chest Portable 1 View  Result Date: 01/11/2019 CLINICAL DATA:  LEFT chest pain, hypoxia, history chronic kidney disease on dialysis EXAM: PORTABLE CHEST 1 VIEW COMPARISON:  Portable exam 1818 hours compared to 12/28/2018 FINDINGS: Enlargement of cardiac silhouette with pulmonary vascular congestion. Atherosclerotic calcification aorta. Scattered interstitial infiltrates likely pulmonary edema question CHF versus fluid overload. No gross pleural effusion or pneumothorax. Bones demineralized. IMPRESSION: Enlargement of cardiac silhouette with pulmonary vascular congestion and probable pulmonary edema. Electronically Signed   By: Lavonia Dana M.D.   On: 01/11/2019 18:54   Vas [place] Lower Extremity Venous (dvt)  Result Date: 01/12/2019  Lower Venous Study Indications: Positive d dimer.  Limitations: Patient movement. Performing Technologist: [name] RVS  Examination Guidelines: A complete evaluation includes B-mode imaging, spectral Doppler, color Doppler, and power Doppler as needed of all accessible portions of each vessel. Bilateral testing is considered an integral part of a complete examination. Limited examinations for reoccurring indications may be performed as noted.  +---------+---------------+---------+-----------+----------+--------------+  RIGHT     Compressibility Phasicity Spontaneity Properties Summary         +---------+---------------+---------+-----------+----------+--------------+  CFV       Full            Yes       Yes                                     +---------+---------------+---------+-----------+----------+--------------+  SFJ       Full                                                             +---------+---------------+---------+-----------+----------+--------------+  FV Prox   Full                                                             +---------+---------------+---------+-----------+----------+--------------+  FV Mid    Full                                                             +---------+---------------+---------+-----------+----------+--------------+  FV Distal Full                                                             +---------+---------------+---------+-----------+----------+--------------+  PFV  Full                                                             +---------+---------------+---------+-----------+----------+--------------+  POP       Full            Yes       Yes                                    +---------+---------------+---------+-----------+----------+--------------+  PTV                                                        Not visualized  +---------+---------------+---------+-----------+----------+--------------+  PERO                                                       Not visualized  +---------+---------------+---------+-----------+----------+--------------+   +---------+---------------+---------+-----------+----------+--------------+  LEFT      Compressibility Phasicity Spontaneity Properties Summary         +---------+---------------+---------+-----------+----------+--------------+  CFV       Full            Yes       Yes                                    +---------+---------------+---------+-----------+----------+--------------+  SFJ       Full                                                             +---------+---------------+---------+-----------+----------+--------------+  FV Prox   Full                                                              +---------+---------------+---------+-----------+----------+--------------+  FV Mid    Full                                                             +---------+---------------+---------+-----------+----------+--------------+  FV Distal Full                                                             +---------+---------------+---------+-----------+----------+--------------+  PFV       Full                                                             +---------+---------------+---------+-----------+----------+--------------+  POP       Full            Yes       Yes                                    +---------+---------------+---------+-----------+----------+--------------+  PTV       Full                                                             +---------+---------------+---------+-----------+----------+--------------+  PERO                                                       Not visualized  +---------+---------------+---------+-----------+----------+--------------+     Summary: Right: There is no evidence of deep vein thrombosis in the lower extremity. However, portions of this examination were limited- see technologist comments above. No cystic structure found in the popliteal fossa. Left: There is no evidence of deep vein thrombosis in the lower extremity. However, portions of this examination were limited- see technologist comments above. No cystic structure found in the popliteal fossa.  *See table(s) above for measurements and observations. Electronically signed by Harold Barban MD on 01/12/2019 at 11:09:18 AM.    Final      Labs:   Basic Metabolic Panel: Recent Labs  Lab 01/14/19 2221 01/15/19 1204 01/17/19 0222 01/17/19 0705 01/19/19 0257  NA 133* 135 132* 132* 131*  K 4.3 4.9 5.1 5.4* 5.8*  CL 97* 94* 93* 91* 91*  CO2 26 31 26 29 28   GLUCOSE 202* 145* 113* 110* 118*  BUN 22 33* 31* 32* 36*  CREATININE 5.25* 6.99* 6.19* 6.61* 6.65*  CALCIUM 7.6* 7.9* 8.6* 8.6* 8.8*  PHOS  --  4.9*   --  4.1  --    GFR Estimated Creatinine Clearance: 6.4 mL/min (A) (by C-G formula based on SCr of 6.65 mg/dL (H)). Liver Function Tests: Recent Labs  Lab 01/15/19 1204 01/17/19 0705  ALBUMIN 2.1* 2.1*   No results for input(s): LIPASE, AMYLASE in the last 168 hours. No results for input(s): AMMONIA in the last 168 hours. Coagulation profile Recent Labs  Lab 01/14/19 1017 01/19/19 0257  INR 1.0 1.1    CBC: Recent Labs  Lab 01/14/19 0738 01/15/19 1204 01/17/19 0222 01/19/19 0257  WBC 8.3 7.6 7.4 10.4  HGB 7.4* 7.9* 8.0* 8.1*  HCT 23.3* 25.7* 25.8* 26.3*  MCV 73.5* 76.0* 75.4* 76.9*  PLT 227 209 200 170   Cardiac Enzymes: No results for input(s): CKTOTAL, CKMB, CKMBINDEX, TROPONINI in the last 168 hours. BNP: Invalid input(s): POCBNP CBG: Recent Labs  Lab 01/19/19 0627 01/19/19 1023 01/19/19 1630 01/19/19  2145 01/20/19 0619  GLUCAP 108* 105* 279* 170* 151*   D-Dimer No results for input(s): DDIMER in the last 72 hours. Hgb A1c No results for input(s): HGBA1C in the last 72 hours. Lipid Profile No results for input(s): CHOL, HDL, LDLCALC, TRIG, CHOLHDL, LDLDIRECT in the last 72 hours. Thyroid function studies No results for input(s): TSH, T4TOTAL, T3FREE, THYROIDAB in the last 72 hours.  Invalid input(s): FREET3 Anemia work up No results for input(s): VITAMINB12, FOLATE, FERRITIN, TIBC, IRON, RETICCTPCT in the last 72 hours. Microbiology Recent Results (from the past 240 hour(s))  SARS Coronavirus 2 (CEPHEID- Performed in [place] hospital lab), Hosp Order     Status: None   Collection Time: 01/11/19  7:57 PM  Result Value Ref Range Status   SARS Coronavirus 2 NEGATIVE NEGATIVE Final    Comment: (NOTE) If result is NEGATIVE SARS-CoV-2 target nucleic acids are NOT DETECTED. The SARS-CoV-2 RNA is generally detectable in upper and lower  respiratory specimens during the acute phase of infection. The lowest  concentration of SARS-CoV-2 viral copies  this assay can detect is 250  copies / mL. A negative result does not preclude SARS-CoV-2 infection  and should not be used as the sole basis for treatment or other  patient management decisions.  A negative result may occur with  improper specimen collection / handling, submission of specimen other  than nasopharyngeal swab, presence of viral mutation(s) within the  areas targeted by this assay, and inadequate number of viral copies  (<250 copies / mL). A negative result must be combined with clinical  observations, patient history, and epidemiological information. If result is POSITIVE SARS-CoV-2 target nucleic acids are DETECTED. The SARS-CoV-2 RNA is generally detectable in upper and lower  respiratory specimens dur ing the acute phase of infection.  Positive  results are indicative of active infection with SARS-CoV-2.  Clinical  correlation with patient history and other diagnostic information is  necessary to determine patient infection status.  Positive results do  not rule out bacterial infection or co-infection with other viruses. If result is PRESUMPTIVE POSTIVE SARS-CoV-2 nucleic acids MAY BE PRESENT.   A presumptive positive result was obtained on the submitted specimen  and confirmed on repeat testing.  While 2019 novel coronavirus  (SARS-CoV-2) nucleic acids may be present in the submitted sample  additional confirmatory testing may be necessary for epidemiological  and / or clinical management purposes  to differentiate between  SARS-CoV-2 and other Sarbecovirus currently known to infect humans.  If clinically indicated additional testing with an alternate test  methodology [id number]) is advised. The SARS-CoV-2 RNA is generally  detectable in upper and lower respiratory sp ecimens during the acute  phase of infection. The expected result is Negative. Fact Sheet for Patients:  StrictlyIdeas.no Fact Sheet for Healthcare  Providers: BankingDealers.co.za This test is not yet approved or cleared by the [place] FDA and has been authorized for detection and/or diagnosis of SARS-CoV-2 by FDA under an Emergency Use Authorization (EUA).  This EUA will remain in effect (meaning this test can be used) for the duration of the COVID-19 declaration under Section 564(b)(1) of the Act, 21 U.S.C. section 360bbb-3(b)(1), unless the authorization is terminated or revoked sooner. Performed at [facility], [place] [address], [place],  [address]      Discharge Instructions:   Discharge Instructions    Call MD for:  severe uncontrolled pain   Complete by:  As directed    Call MD for:  temperature >  100.4   Complete by:  As directed    Diet - low sodium heart healthy   Complete by:  As directed    Discharge instructions   Complete by:  As directed    Take all medications as prescribed.  Continue hemodialysis as scheduled.  Follow-up with your primary care physician within 1 week   Increase activity slowly   Complete by:  As directed      Allergies as of 01/20/2019      Reactions   Acetaminophen Nausea And Vomiting   Patient states Acetaminophen and Acetaminophen containing products make her nauseated. She demonstrated this 06/21/18 with nausea followed by emesis.   Hydroxyzine Itching      Medication List  Allergies as of 01/20/2019      Reactions   Acetaminophen Nausea And Vomiting   Patient states Acetaminophen and Acetaminophen containing products make her nauseated. She demonstrated this 06/21/18 with nausea followed by emesis.   Hydroxyzine Itching      Medication List    STOP taking these medications   HYDROmorphone 2 MG tablet Commonly known as:  DILAUDID     TAKE these medications   Accu-Chek Aviva device Use as instructed   accu-chek soft touch lancets Use twice per day.   amLODipine 5 MG tablet Commonly known as:  NORVASC Take 1 tablet (5 mg total)  by mouth at bedtime.   aspirin 81 MG EC tablet Take 1 tablet (81 mg total) by mouth daily.   calcitRIOL 0.25 MCG capsule Commonly known as:  ROCALTROL Take 1 capsule (0.25 mcg total) by mouth every Monday, Wednesday, and Friday with hemodialysis.   Dialyvite 800-Zinc 15 0.8 MG Tabs Take 1 tablet by mouth daily.   diphenhydrAMINE 2 % cream Commonly known as:  BENADRYL Apply 1 application topically 3 (three) times daily as needed for itching.   docusate sodium 100 MG capsule Commonly known as:  Colace Take 1 capsule (100 mg total) by mouth daily for 30 days.   fluticasone 50 MCG/ACT nasal spray Commonly known as:  FLONASE Place 2 sprays into both nostrils daily.   gabapentin 100 MG capsule Commonly known as:  NEURONTIN Take 1 capsule (100 mg total) by mouth 3 (three) times daily for 30 days.   glucose blood test strip Commonly known as:  Accu-Chek Aviva Use twice per day.   hydrocortisone cream 1 % Apply 1 application topically 2 (two) times daily.   insulin aspart 100 UNIT/ML injection Commonly known as:  novoLOG Inject 5 Units into the skin 3 (three) times daily with meals. What changed:  how much to take   insulin glargine 100 UNIT/ML injection Commonly known as:  LANTUS Inject 0.03 mLs (3 Units total) into the skin at bedtime. What changed:  how much to take   INSULIN SYRINGE .3CC/31GX5/16" 31G X 5/16" 0.3 ML Misc Inject 1 each into the skin 4 (four) times daily.   lidocaine-prilocaine cream Commonly known as:  EMLA Apply 1 application topically See admin instructions. Apply small amount to access site (AVF)before dialysis. Cover with occlusive dressing (Saran wrap)   nicotine 21 mg/24hr patch Commonly known as:  NICODERM CQ - dosed in mg/24 hours Place 1 patch (21 mg total) onto the skin daily.   oxyCODONE 5 MG immediate release tablet Commonly known as:  Oxy IR/ROXICODONE Take 1 tablet (5 mg total) by mouth every 6 (six) hours as needed for moderate pain  or severe pain.   pantoprazole 40 MG tablet Commonly known as:  Protonix Take 1 tablet (40 mg total) by mouth daily.   polyethylene glycol 17 g packet Commonly known as:  MIRALAX / GLYCOLAX Take 17 g by mouth daily.   sevelamer carbonate 800 MG tablet Commonly known as:  RENVELA Take 2 tablets (1,600 mg total) by mouth 3 (three) times daily with meals.        Time coordinating discharge: 39 minutes  Signed:  Kia Varnadore  [facility] 01/20/2019, 7:10 AM

## 2019-01-20 NOTE — Progress Notes (Signed)
PT Cancellation Note  Patient Details Name: ZAKARIA FROMER MRN: [id number] DOB: 25-Feb-1956   Cancelled Treatment:    Reason Eval/Treat Not Completed: Patient at procedure or test/unavailable (HD). Will follow-up for PT treatment as schedule permits.  Mabeline Caras, PT, DPT Acute Rehabilitation Services  Pager [phone] Office [place] 01/20/2019, 8:13 AM

## 2019-01-20 NOTE — Plan of Care (Signed)
  Problem: Education: Goal: Knowledge of General Education information will improve Description: Including pain rating scale, medication(s)/side effects and non-pharmacologic comfort measures Outcome: Completed/Met   Problem: Health Behavior/Discharge Planning: Goal: Ability to manage health-related needs will improve Outcome: Completed/Met   Problem: Clinical Measurements: Goal: Ability to maintain clinical measurements within normal limits will improve Outcome: Completed/Met Goal: Will remain free from infection Outcome: Completed/Met Goal: Diagnostic test results will improve Outcome: Completed/Met Goal: Respiratory complications will improve Outcome: Completed/Met Goal: Cardiovascular complication will be avoided Outcome: Completed/Met   Problem: Activity: Goal: Risk for activity intolerance will decrease Outcome: Completed/Met   Problem: Pain Managment: Goal: General experience of comfort will improve Outcome: Completed/Met   Problem: Safety: Goal: Ability to remain free from injury will improve Outcome: Completed/Met   

## 2019-01-20 NOTE — TOC Transition Note (Signed)
Transition of Care [facility]) - CM/SW Discharge Note   Patient Details  Name: Rhonda Lynch MRN: [id number] Date of Birth: 1956-04-20  [facility] CM/SW Contact:  Maryclare Labrador, RN Phone Number: 01/20/2019, 12:45 PM   Clinical Narrative:   Pt to discharge today.  Pt informed CM that she plans to discharge to a hotel and pt also confirmed that she has money for hotel.  Pts friend will pick her up and transport her to hotel at discharge.   Pt has PCP follow up appt - CM spoke with pt directly and informed of follow up appt.  Pt denied barriers with paying for prescription medications.  PT declined HH as recommended - no orders written.            Patient Goals and CMS Choice        Discharge Placement                       Discharge Plan and Services                                     Social Determinants of Health (SDOH) Interventions     Readmission Risk Interventions Readmission Risk Prevention Plan 01/20/2019 01/16/2019  Transportation Screening - Complete  Medication Review Press photographer) - Complete  PCP or Specialist appointment within 3-5 days of discharge Complete Not Complete  PCP/Specialist Appt Not Complete comments Follow up appt 5/20 at 10:10am Pt has PCP  [place] or [place] - Not Complete  HRI or Home Care Consult Pt Refusal Comments - NA  SW Recovery Care/Counseling Consult - Complete  Palliative Care Screening - Not Applicable  Skilled Nursing Facility - Not Applicable  Some recent data might be hidden

## 2019-01-20 NOTE — Progress Notes (Signed)
   VASCULAR SURGERY ASSESSMENT & PLAN:   1 Day Post-Op s/p: Placement of new right forearm AV graft  The graft is patent with a good thrill and bruit.  She has a palpable radial pulse.  Her incisions look fine.  Vascular surgery will be available as needed.   SUBJECTIVE:   No complaints.  Currently on hemodialysis.  PHYSICAL EXAM:   Vitals:   01/20/19 0730 01/20/19 0800 01/20/19 0830 01/20/19 0900  BP: (!) 145/79 136/71 110/78 (!) 119/56  Pulse: 89 84 (!) 110 97  Resp: 19 17    Temp:      TempSrc:      SpO2:      Weight:       Good thrill in right forearm AV graft. Palpable right radial pulse. Reportedly she had some oozing from her incision last night and has a dressing on this.  I remove this and the incision looks fine with no bleeding.  LABS:   Lab Results  Component Value Date   WBC 9.9 01/20/2019   HGB 7.5 (L) 01/20/2019   HCT 25.2 (L) 01/20/2019   MCV 78.5 (L) 01/20/2019   PLT 159 01/20/2019   Lab Results  Component Value Date   CREATININE 6.46 (H) 01/20/2019   Lab Results  Component Value Date   INR 1.1 01/19/2019   CBG (last 3)  Recent Labs    01/19/19 1630 01/19/19 2145 01/20/19 0619  GLUCAP 279* 170* 151*    PROBLEM LIST:    Principal Problem:   Acute pulmonary edema (HCC) Active Problems:   TOBACCO ABUSE   Alcohol abuse   Cocaine abuse (HCC)   Elevated troponin   Hypertension   Noncompliance   Acute respiratory failure with hypoxia (HCC)   Insulin dependent diabetes mellitus (HCC)   Chest pain   End-stage renal disease on hemodialysis (HCC)   Hyperkalemia   Acute on chronic systolic CHF (congestive heart failure) (HCC)   Fluid overload   Acute metabolic encephalopathy   CURRENT MEDS:   . amLODipine  5 mg Oral Daily  . aspirin EC  81 mg Oral Daily  . Chlorhexidine Gluconate Cloth  6 each Topical Q0600  . Chlorhexidine Gluconate Cloth  6 each Topical Q0600  . cloNIDine  0.1 mg Transdermal Weekly  . darbepoetin (ARANESP)  injection - DIALYSIS  150 mcg Intravenous Q Sat-HD  . doxercalciferol  5 mcg Intravenous Q T,Th,Sa-HD  . feeding supplement (NEPRO CARB STEADY)  237 mL Oral BID BM  . fluticasone  2 spray Each Nare Daily  . folic acid  1 mg Oral Daily  . gabapentin  100 mg Oral TID  . guaiFENesin  600 mg Oral BID  . hydrocortisone cream  1 application Topical BID  . insulin aspart  0-9 Units Subcutaneous TID WC  . multivitamin with minerals  1 tablet Oral Daily  . nicotine  21 mg Transdermal Daily  . pantoprazole  40 mg Oral Daily  . pneumococcal 23 valent vaccine  0.5 mL Intramuscular Tomorrow-1000  . senna-docusate  1 tablet Oral BID  . sevelamer carbonate  800 mg Oral TID WC  . thiamine  100 mg Oral Daily   Or  . thiamine  100 mg Intravenous Daily    Rhonda Lynch Beeper: [phone] Office: [phone] 01/20/2019

## 2019-01-20 NOTE — Progress Notes (Signed)
[facility] Progress Note    Assessment/ Plan:   [facility] T[facility] 4h 400/800 54kg 2/2.25 bath Hep 3000 units IV TIWLUA AVG - hectorol 5 mcg IV TIW - venofer 100 mg IV X 5 doses 1/5 given 01/03/19 - mircera 100 mg every 2 wks , (last dose 100 mcg IV 12/25/18)  Problem/Plan: 1. Volume overload/Acute respiratory failure D/T noncompliance with HD. May need to lower dry weight on discharge  2. AMS-possible withdrawal, uremia.now @ baseline. sedating meds titrated. Per primary. 3. Chest pain -trop flat,EKG neg and CTA chest negative for PE (old pe) 4. ESRD on HD: HD today per TTS schedule 5. Clotted LUA AVGG =Had L AVG declot w/ arterial revision on 12/11/18.Unable to HDon admitas she was thrombosed.  RIJ Tunneled catheter and new right arm AV graft placed 5/11.  6. Diarrhea-H/O C diff colitis.  Per primary 7. HTN-  On norvasc and clonidine and optimizing volume with HD. May be able to titrate meds once euvolemic 8. Drug abuse - not currently using drugsper pt 9. Smoker - trying to quit, has nicotine patch on 10. Anemia ckd - Dosed aranesp 150 x1 on 5/9.   11. BMD-  Reduce binders for now to renvela 800 mg TID with meals (from 1600 mg TID).  Likely to be able to add back once on home diet again.  Cont VDRA.  12. Hep C 13. H/o CVA 33. DM = per admit team   Subjective:   She had abbreviated HD treatment yesterday and agreed to stay for a full treatment on her normal TTS schedule.  Seen on HD today.  BP 142/71 and HR 96.  Tolerating goal.  Post-op discomfort   Review of systems:  Denies shortness of breath or chest pain  No nausea or vomiting    Objective:   BP (!) 145/79 (BP Location: Right Arm)   Pulse 89   Temp 97.7 F (36.5 C) (Oral)   Resp 19   Wt 55.1 kg   SpO2 100%   BMI 22.95 kg/m   Intake/Output Summary (Last 24 hours) at 01/20/2019 7412 Last data filed at 01/19/2019 2200 Gross per 24 hour  Intake 1197 ml  Output 925 ml  Net 272 ml    Weight change:   Physical Exam: General:Chronically ill appearing female in NAD on HD Heart: S1S2; no rub Lungs:clear and unlabored on room air Abdomen:soft/NT/ND Extremities:no edema lower extremities  Dialysis Access:Right EJ tunneled dialysis catheter; RUE graft bruit and thrill; LUAAVGNObruit   Imaging: Dg Chest Port 1 View  Result Date: 01/19/2019 CLINICAL DATA:  Catheter placement. EXAM: PORTABLE CHEST 1 VIEW COMPARISON:  Radiograph of Jan 12, 2019. FINDINGS: Stable cardiomegaly. Atherosclerosis of thoracic aorta is noted. Stable interstitial densities are noted throughout both lungs concerning for edema. No pneumothorax or pleural effusion is noted. Minimal right basilar subsegmental atelectasis is noted. Interval placement of right internal jugular dialysis catheter with distal tip in right atrium. Bony thorax is unremarkable. IMPRESSION: Interval placement of right internal jugular dialysis catheter with distal tip in right atrium. No pneumothorax is noted. Stable cardiomegaly with probable bilateral pulmonary edema. Aortic Atherosclerosis (ICD10-I70.0). Electronically Signed   By: Marijo Conception M.D.   On: 01/19/2019 10:18   Dg Fluoro Guide Cv Line-no Report  Result Date: 01/19/2019 Fluoroscopy was utilized by the requesting physician.  No radiographic interpretation.    Labs: [employer] 01/14/19 8786 01/14/19 2221 01/15/19 1204 01/17/19 0222 01/17/19 7672 01/19/19 0257 01/20/19 0739  NA  136 133* 135 132* 132* 131* 131*  K 6.2* 4.3 4.9 5.1 5.4* 5.8* 4.9  CL 100 97* 94* 93* 91* 91* 96*  CO2 26 26 31 26 29 28 27   GLUCOSE 110* 202* 145* 113* 110* 118* 149*  BUN 82* 22 33* 31* 32* 36* 41*  CREATININE 12.82* 5.25* 6.99* 6.19* 6.61* 6.65* 6.46*  CALCIUM 7.7* 7.6* 7.9* 8.6* 8.6* 8.8* 8.5*  PHOS  --   --  4.9*  --  4.1  --  2.2*   CBC Recent Labs  Lab 01/15/19 1204 01/17/19 0222 01/19/19 0257 01/20/19 0739  WBC 7.6 7.4 10.4 9.9  NEUTROABS   --   --   --  6.2  HGB 7.9* 8.0* 8.1* 7.5*  HCT 25.7* 25.8* 26.3* 25.2*  MCV 76.0* 75.4* 76.9* 78.5*  PLT 209 200 170 159    Medications:    . amLODipine  5 mg Oral Daily  . aspirin EC  81 mg Oral Daily  . Chlorhexidine Gluconate Cloth  6 each Topical Q0600  . Chlorhexidine Gluconate Cloth  6 each Topical Q0600  . cloNIDine  0.1 mg Transdermal Weekly  . darbepoetin (ARANESP) injection - DIALYSIS  150 mcg Intravenous Q Sat-HD  . doxercalciferol  5 mcg Intravenous Q T,Th,Sa-HD  . feeding supplement (NEPRO CARB STEADY)  237 mL Oral BID BM  . fluticasone  2 spray Each Nare Daily  . folic acid  1 mg Oral Daily  . gabapentin  100 mg Oral TID  . guaiFENesin  600 mg Oral BID  . hydrocortisone cream  1 application Topical BID  . insulin aspart  0-9 Units Subcutaneous TID WC  . multivitamin with minerals  1 tablet Oral Daily  . nicotine  21 mg Transdermal Daily  . pantoprazole  40 mg Oral Daily  . pneumococcal 23 valent vaccine  0.5 mL Intramuscular Tomorrow-1000  . senna-docusate  1 tablet Oral BID  . sevelamer carbonate  1,600 mg Oral TID WC  . thiamine  100 mg Oral Daily   Or  . thiamine  100 mg Intravenous Daily    Claudia Desanctis 01/20/2019, 8:34 AM

## 2019-01-20 NOTE — Progress Notes (Signed)
Renal Navigator notified [facility] HD[facility] of hospital discharge today to provide continuity of care and assist with smooth transition between hospital and OP HD clinic. Clinic Social Worker notified of discharge to hotel.  Alphonzo Cruise Renal Navigator [phone]

## 2019-01-23 ENCOUNTER — Other Ambulatory Visit: Payer: Self-pay

## 2019-01-23 ENCOUNTER — Ambulatory Visit: Payer: Self-pay | Admitting: *Deleted

## 2019-01-23 ENCOUNTER — Encounter (HOSPITAL_COMMUNITY): Payer: Self-pay | Admitting: Emergency Medicine

## 2019-01-23 ENCOUNTER — Emergency Department (HOSPITAL_COMMUNITY)
Admission: EM | Admit: 2019-01-23 | Discharge: 2019-01-23 | Disposition: A | Discharge status: Home / Self Care | Payer: Medicare Other | Attending: Emergency Medicine | Admitting: Emergency Medicine

## 2019-01-23 DIAGNOSIS — I132 Hypertensive heart and chronic kidney disease with heart failure and with stage 5 chronic kidney disease, or end stage renal disease: Secondary | ICD-10-CM | POA: Diagnosis not present

## 2019-01-23 DIAGNOSIS — N186 End stage renal disease: Secondary | ICD-10-CM | POA: Insufficient documentation

## 2019-01-23 DIAGNOSIS — Z794 Long term (current) use of insulin: Secondary | ICD-10-CM | POA: Insufficient documentation

## 2019-01-23 DIAGNOSIS — I5022 Chronic systolic (congestive) heart failure: Secondary | ICD-10-CM | POA: Diagnosis not present

## 2019-01-23 DIAGNOSIS — E1122 Type 2 diabetes mellitus with diabetic chronic kidney disease: Secondary | ICD-10-CM | POA: Diagnosis not present

## 2019-01-23 DIAGNOSIS — F1110 Opioid abuse, uncomplicated: Secondary | ICD-10-CM | POA: Diagnosis not present

## 2019-01-23 DIAGNOSIS — F17210 Nicotine dependence, cigarettes, uncomplicated: Secondary | ICD-10-CM | POA: Diagnosis not present

## 2019-01-23 DIAGNOSIS — F1410 Cocaine abuse, uncomplicated: Secondary | ICD-10-CM | POA: Insufficient documentation

## 2019-01-23 DIAGNOSIS — Z992 Dependence on renal dialysis: Secondary | ICD-10-CM | POA: Insufficient documentation

## 2019-01-23 DIAGNOSIS — R58 Hemorrhage, not elsewhere classified: Secondary | ICD-10-CM

## 2019-01-23 DIAGNOSIS — Z7982 Long term (current) use of aspirin: Secondary | ICD-10-CM | POA: Diagnosis not present

## 2019-01-23 DIAGNOSIS — Z79899 Other long term (current) drug therapy: Secondary | ICD-10-CM | POA: Diagnosis not present

## 2019-01-23 NOTE — ED Triage Notes (Signed)
Patient reports noticing her R chest dialysis access was bleeding this morning - last accessed yesterday without difficulty. She endorses discomfort around the area. Temp 100.65F in triage. She denies shortness of breath, cough, or dizziness. Bleeding appears to be controlled with bandage. Resp e/u.

## 2019-01-23 NOTE — ED Notes (Signed)
Lesleigh [phone]

## 2019-01-23 NOTE — Telephone Encounter (Signed)
Pt called with having some bleeding from her dialysis catheter. She stated that it is located on her right shoulder area. It started bleeding last night, she ;put a bandage on it and it bleeding again thru another bandage. It has been used for dialysis once. The catheter was place on 01/19/19. Pt advised to go to an urgent care to have her catheter checked. Pt voiced understanding.  Reason for Disposition . Nursing judgment  Protocols used: NO GUIDELINE OR REFERENCE AVAILABLE-A-AH

## 2019-01-23 NOTE — ED Provider Notes (Signed)
[facility] EMERGENCY DEPARTMENT Provider Note   CSN: [id number] Arrival date & time: 01/23/19  1749    History   Chief Complaint Chief Complaint  Patient presents with  . Vascular Access Problem    HPI Rhonda Lynch is a 63 y.o. female.  Past medical history of ESRD (T, Th, Sa) presents to the ED with concern from bleeding from cath site.  Patient states after dialysis yesterday she had bleeding around the perm cath site. Placed a bandage over it and called the nursing hotline who told her to come to the ED. No other symptoms or complaints.     HPI  Past Medical History:  Diagnosis Date  . Diabetes mellitus without complication ([place])   . ESRD (end stage renal disease) on dialysis ([place])    "TTS; [place]; they're moving me to another one" (09/22/2018)  . Hepatitis C    diagnosed 09/2018  . Hypertension   . Oxygen deficiency    2 L at night  . Stroke ([place])   . Substance abuse ([place])    has been clean for 4 months     Patient Active Problem List   Diagnosis Date Noted  . Acute metabolic encephalopathy   . Acute on chronic systolic CHF (congestive heart failure) ([place]) 01/11/2019  . Fluid overload 01/11/2019  . Diarrhea 12/28/2018  . Metabolic acidosis, increased anion gap (IAG) 12/19/2018  . Volvulus ([place]) 12/19/2018  . Hyperkalemia 11/25/2018  . Cocaine overdose, accidental or unintentional, initial encounter ([place]) 11/25/2018  . CAP (community acquired pneumonia) 11/17/2018  . End-stage renal disease on hemodialysis ([place])   . Hypoglycemia 10/23/2018  . Non-intractable vomiting   . Other ascites   . Palliative care encounter   . Left arm pain   . Chronic hepatitis C virus genotype 1b infection ([place]) 09/28/2018  . ICH (intracerebral hemorrhage) ([place]) small left caudate due to HTN 07/17/2018  . HCAP (healthcare-associated pneumonia) 06/21/2018  . Substance induced mood disorder ([place]) 05/15/2018  . Agitation   . Hemodialysis catheter  infection, initial encounter ([place])   . 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 Pulmonary embolism ([place]) 04/27/2018  . Sepsis ([place]) 04/26/2018  . Dyspnea 04/26/2018  . Chest pain 04/26/2018  . Tachycardia 04/26/2018  . Abdominal pain 04/18/2018  . Insulin dependent diabetes mellitus ([place]) 04/18/2018  . Right flank pain 04/18/2018  . Acute respiratory failure with hypoxia ([place]) 04/12/2018  . SVT (supraventricular tachycardia) ([place])   . Chronic renal failure   . Hypertension   . Noncompliance   . Acute pulmonary edema (HCC)   . Elevated troponin 03/19/2018  . Fever   . 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 Diffuse pulmonary alveolar hemorrhage   . Hypoxemia   . Pulmonary infiltrate   . 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 ESRD needing dialysis ([place]) 02/12/2018  . Acute respiratory failure ([place]) 02/12/2018  . Alcohol abuse 02/12/2018  . Microcytic anemia 02/12/2018  . Cocaine abuse ([place]) 02/12/2018  . Epigastric pain 02/12/2018  . Pneumonia 02/12/2018  . TOBACCO ABUSE 03/08/2010  . PNEUMONIA 03/08/2010    Past Surgical History:  Procedure Laterality Date  . AV FISTULA PLACEMENT Left 03/05/2018   Procedure: ARTERIOVENOUS (AV) FISTULA CREATION  LEFT UPPER EXTREMITY;  Surgeon: [name], MD;  Location: [facility];  Service: Vascular;  Laterality: Left;  . AV FISTULA PLACEMENT Left 07/21/2018   Procedure: ARTERIOVENOUS (AV) FISTULA CREATION;  Surgeon: Marty Heck, MD;  Location: [facility];  Service: Vascular;  Laterality: Left;  . AV FISTULA PLACEMENT Left 10/27/2018   Procedure: INSERTION OF ARTERIOVENOUS (AV) GORE-TEX GRAFT UPPER ARM;  Surgeon: Marty Heck, MD;  Location: [facility];  Service: Vascular;  Laterality: Left;  . AV FISTULA PLACEMENT Right 01/19/2019   Procedure: INSERTION OF ARTERIOVENOUS GRAFT RIGHT ARM;  Surgeon: [name], MD;  Location: [facility];  Service: Vascular;  Laterality: Right;  . BASCILIC VEIN TRANSPOSITION Left 09/29/2018   Procedure:  BASILIC VEIN TRANSPOSITION SECOND STAGE LEFT UPPER ARM;  Surgeon: Marty Heck, MD;  Location: [facility];  Service: Vascular;  Laterality: Left;  . HEMATOMA EVACUATION Left 03/06/2018   Procedure: EVACUATION HEMATOMA LEFT ARM;  Surgeon: Angelia Mould, MD;  Location: [facility];  Service: Vascular;  Laterality: Left;  . I&D EXTREMITY Left 10/06/2018   Procedure: IRRIGATION AND DEBRIDEMENT ARM;  Surgeon: Marty Heck, MD;  Location: [facility];  Service: Vascular;  Laterality: Left;  . INSERTION OF DIALYSIS CATHETER Left 04/29/2018   Procedure: INSERTION OF DIALYSIS CATHETER LEFT INTERNAL JUGULAR;  Surgeon: Angelia Mould, MD;  Location: [facility];  Service: Vascular;  Laterality: Left;  . INSERTION OF DIALYSIS CATHETER Right 01/19/2019   Procedure: INSERTION OF HEMO DIALYSIS  PALINDROME TUNNELED CATHETER;  Surgeon: [name], MD;  [facility]: [facility];  Service: Vascular;  Laterality: Right;  . IR FLUORO GUIDE CV LINE RIGHT  02/18/2018  . IR FLUORO GUIDE CV LINE RIGHT  02/26/2018  . IR FLUORO GUIDE CV LINE RIGHT  01/14/2019  . IR REMOVAL TUN CV CATH W/O FL  04/27/2018  . IR US GUIDE VASC ACCESS RIGHT  02/18/2018  . IR US GUIDE VASC ACCESS RIGHT  01/14/2019  . TEE WITHOUT CARDIOVERSION N/A 04/29/2018   Procedure: TRANSESOPHAGEAL ECHOCARDIOGRAM (TEE);  Surgeon: Sueanne Margarita, MD;  Location: [facility] ENDOSCOPY;  Service: Cardiovascular;  Laterality: N/A;  . THROMBECTOMY AND REVISION OF ARTERIOVENTOUS (AV) GORETEX  GRAFT Left 12/11/2018   Procedure: THROMBECTOMY AND  REVISION  ARTERIOVENTOUS GORETEX  GRAFT LEFT ARM;  Surgeon: [name], MD;  Location: [facility];  Service: Vascular;  Laterality: Left;  Marland Kitchen VENOGRAM Left 10/27/2018   Procedure: LEFT CENTRAL VENOGRAM;  Surgeon: Marty Heck, MD;  Location: [facility];  Service: Vascular;  Laterality: Left;     OB History   No obstetric history on file.      Home Medications    Prior to Admission medications   Medication Sig Start Date End Date  Taking? Authorizing Provider  amLODipine (NORVASC) 5 MG tablet Take 1 tablet (5 mg total) by mouth at bedtime. 10/13/18   Alphonzo Grieve, MD  aspirin EC 81 MG EC tablet Take 1 tablet (81 mg total) by mouth daily. 01/20/19   Pokhrel, Corrie Mckusick, MD  B Complex-C-Zn-Folic Acid (DIALYVITE 195-KDTO 15) 0.8 MG TABS Take 1 tablet by mouth daily. 11/04/18   [provider]  Blood Glucose Monitoring Suppl (ACCU-CHEK AVIVA) device Use as instructed 10/29/18 10/29/19  Isabelle Course, MD  calcitRIOL (ROCALTROL) 0.25 MCG capsule Take 1 capsule (0.25 mcg total) by mouth every Monday, Wednesday, and Friday with hemodialysis. 10/29/18   Isabelle Course, MD  diphenhydrAMINE (BENADRYL) 2 % cream Apply 1 application topically 3 (three) times daily as needed for itching.    [provider]  docusate sodium (COLACE) 100 MG capsule Take 1 capsule (100 mg total) by mouth daily for 30 days. 12/29/18 01/28/19  Thurnell Lose, MD  fluticasone (FLONASE) 50 MCG/ACT nasal spray Place 2  sprays into both nostrils daily.    [provider]  gabapentin (NEURONTIN) 100 MG capsule Take 1 capsule (100 mg total) by mouth 3 (three) times daily for 30 days. 01/20/19 02/19/19  Pokhrel, Corrie Mckusick, MD  glucose blood (ACCU-CHEK AVIVA) test strip Use twice per day. 10/29/18   Isabelle Course, MD  hydrocortisone cream 1 % Apply 1 application topically 2 (two) times daily. 01/20/19   Pokhrel, Corrie Mckusick, MD  insulin aspart (NOVOLOG) 100 UNIT/ML injection Inject 5 Units into the skin 3 (three) times daily with meals. Patient taking differently: Inject 0-9 Units into the skin 3 (three) times daily with meals.  10/13/18   Alphonzo Grieve, MD  insulin glargine (LANTUS) 100 UNIT/ML injection Inject 0.03 mLs (3 Units total) into the skin at bedtime. Patient taking differently: Inject 5 Units into the skin at bedtime.  10/29/18   Isabelle Course, MD  Insulin Syringe-Needle U-100 (INSULIN SYRINGE .3CC/31GX5/16") 31G X 5/16" 0.3 ML MISC Inject 1 each  into the skin 4 (four) times daily. 06/27/18   Clent Demark, PA-C  Lancets (ACCU-CHEK SOFT [facility]) lancets Use twice per day. 10/29/18   Isabelle Course, MD  lidocaine-prilocaine (EMLA) cream Apply 1 application topically See admin instructions. Apply small amount to access site (AVF)before dialysis. Cover with occlusive dressing (Saran wrap) 12/29/18   [provider]  nicotine (NICODERM CQ - DOSED IN MG/24 HOURS) 21 mg/24hr patch Place 1 patch (21 mg total) onto the skin daily. 01/20/19   Pokhrel, Corrie Mckusick, MD  oxyCODONE (OXY IR/ROXICODONE) 5 MG immediate release tablet Take 1 tablet (5 mg total) by mouth every 6 (six) hours as needed for moderate pain or severe pain. 01/20/19   Pokhrel, Corrie Mckusick, MD  pantoprazole (PROTONIX) 40 MG tablet Take 1 tablet (40 mg total) by mouth daily. 11/21/18   Ghimire, Henreitta Leber, MD  polyethylene glycol (MIRALAX / GLYCOLAX) 17 g packet Take 17 g by mouth daily. 12/29/18   Thurnell Lose, MD  sevelamer carbonate (RENVELA) 800 MG tablet Take 2 tablets (1,600 mg total) by mouth 3 (three) times daily with meals. 11/26/18   Bonnell Public, MD    Family History Family History  Problem Relation Age of Onset  . Autoimmune disease Neg Hx     Social History Social History   Tobacco Use  . Smoking status: Current Every Day Smoker    Packs/day: 0.50    Years: 48.00    Pack years: 24.00    Types: Cigarettes  . Smokeless tobacco: Never Used  . Tobacco comment: trying to quit ; smoking 1 1/2 cigarettes/day  (09/22/2018)  Substance Use Topics  . Alcohol use: Not Currently    Comment: 09/22/2018 "stopped about 6 months ago"  . Drug use: Yes    Types: Cocaine, Heroin    Comment: See notes     Allergies   Acetaminophen and Hydroxyzine   Review of Systems Review of Systems  Constitutional: Negative for chills and fever.  HENT: Negative for ear pain and sore throat.   Eyes: Negative for pain and visual disturbance.  Respiratory: Negative for cough and  shortness of breath.   Cardiovascular: Negative for chest pain, palpitations and leg swelling.  Gastrointestinal: Negative for abdominal pain and vomiting.  Genitourinary: Negative for dysuria and hematuria.  Musculoskeletal: Negative for arthralgias and back pain.  Skin: Negative for color change and rash.  Neurological: Negative for seizures and syncope.  All other systems reviewed and are negative.    Physical Exam Updated  Vital Signs BP (!) 152/72   Pulse (!) 104   Temp 98.4 F (36.9 C) (Oral)   Resp 18   Ht 5\' 1"  (1.549 m)   Wt 57.6 kg   SpO2 100%   BMI 24.00 kg/m   Physical Exam Vitals signs and nursing note reviewed.  Constitutional:      General: She is not in acute distress.    Appearance: Normal appearance. She is well-developed.  HENT:     Head: Normocephalic and atraumatic.  Eyes:     Conjunctiva/sclera: Conjunctivae normal.  Neck:     Musculoskeletal: Neck supple.  Cardiovascular:     Rate and Rhythm: Normal rate and regular rhythm.     Heart sounds: No murmur.  Pulmonary:     Effort: Pulmonary effort is normal. No respiratory distress.     Breath sounds: Normal breath sounds.  Abdominal:     Palpations: Abdomen is soft.     Tenderness: There is no abdominal tenderness.  Skin:    General: Skin is warm and dry.     Comments: Dried blood to permcath bandage. No active bleeding. No erythema or drainage.   Neurological:     Mental Status: She is alert.      ED Treatments / Results  Labs (all labs ordered are listed, but only abnormal results are displayed) Labs Reviewed - No data to display  EKG None  Radiology No results found.  Procedures Procedures (including critical care time)  Medications Ordered in ED Medications - No data to display   Initial Impression / Assessment and Plan / ED Course  I have reviewed the triage vital signs and the nursing notes.  Pertinent labs & imaging results that were available during my care of the  patient were reviewed by me and considered in my medical decision making (see chart for details).        ANASTAISA WOODING is a 63 y.o. female.  Past medical history of ESRD (T, Th, Sa) presents to the ED with concern from bleeding from cath site  On initial exam patient well appearing, not in acute distress. VSS. Physical exam as above. No concerns for infection. Minimal dried blood to the perm cath bandage. Bandage removed and the area inspected and cleaned. No active bleeding. Bandage replaced.   No other symptoms or concerns therefore will not obtain further labs or imaging. Patient discharged home in stable condition. Return precautions given.   Final Clinical Impressions(s) / ED Diagnoses   Final diagnoses:  ESRD (end stage renal disease) [facility])  Bleeding    ED Discharge Orders    None       Doneta Public, MD 01/23/19 2057    Jola Schmidt, MD 01/24/19 [phone]

## 2019-01-27 ENCOUNTER — Telehealth: Payer: Self-pay | Admitting: *Deleted

## 2019-01-27 NOTE — Telephone Encounter (Signed)
Medical Assistant left message on patient's home and cell voicemail. Voicemail states to give a call back to [place] with [facility] at [phone]. MA also contact emergency contact Berneta Sages who will attempt to locate the patient and relay the message for a tele-visit on tomorrow at 10:10.

## 2019-01-28 ENCOUNTER — Ambulatory Visit: Payer: Medicare Other | Admitting: Primary Care

## 2019-02-03 ENCOUNTER — Other Ambulatory Visit: Payer: Self-pay

## 2019-02-03 ENCOUNTER — Inpatient Hospital Stay (HOSPITAL_COMMUNITY)
Admission: EM | Admit: 2019-02-03 | Discharge: 2019-02-07 | DRG: 871 | Disposition: A | Discharge status: Home / Self Care | Payer: Medicare Other | Source: Ambulatory Visit | Attending: Internal Medicine | Admitting: Internal Medicine

## 2019-02-03 ENCOUNTER — Encounter (HOSPITAL_COMMUNITY): Payer: Self-pay | Admitting: Emergency Medicine

## 2019-02-03 ENCOUNTER — Emergency Department (HOSPITAL_COMMUNITY): Payer: Medicare Other

## 2019-02-03 ENCOUNTER — Inpatient Hospital Stay (HOSPITAL_COMMUNITY): Payer: Medicare Other

## 2019-02-03 DIAGNOSIS — Z79899 Other long term (current) drug therapy: Secondary | ICD-10-CM

## 2019-02-03 DIAGNOSIS — Z90710 Acquired absence of both cervix and uterus: Secondary | ICD-10-CM

## 2019-02-03 DIAGNOSIS — Z8679 Personal history of other diseases of the circulatory system: Secondary | ICD-10-CM

## 2019-02-03 DIAGNOSIS — Z8701 Personal history of pneumonia (recurrent): Secondary | ICD-10-CM | POA: Diagnosis not present

## 2019-02-03 DIAGNOSIS — J159 Unspecified bacterial pneumonia: Secondary | ICD-10-CM | POA: Diagnosis present

## 2019-02-03 DIAGNOSIS — Z8673 Personal history of transient ischemic attack (TIA), and cerebral infarction without residual deficits: Secondary | ICD-10-CM

## 2019-02-03 DIAGNOSIS — Z9115 Patient's noncompliance with renal dialysis: Secondary | ICD-10-CM

## 2019-02-03 DIAGNOSIS — D509 Iron deficiency anemia, unspecified: Secondary | ICD-10-CM | POA: Diagnosis present

## 2019-02-03 DIAGNOSIS — R1012 Left upper quadrant pain: Secondary | ICD-10-CM | POA: Diagnosis present

## 2019-02-03 DIAGNOSIS — J9601 Acute respiratory failure with hypoxia: Secondary | ICD-10-CM | POA: Diagnosis present

## 2019-02-03 DIAGNOSIS — N19 Unspecified kidney failure: Secondary | ICD-10-CM

## 2019-02-03 DIAGNOSIS — R0902 Hypoxemia: Secondary | ICD-10-CM | POA: Diagnosis not present

## 2019-02-03 DIAGNOSIS — Z992 Dependence on renal dialysis: Secondary | ICD-10-CM

## 2019-02-03 DIAGNOSIS — J810 Acute pulmonary edema: Secondary | ICD-10-CM | POA: Diagnosis present

## 2019-02-03 DIAGNOSIS — M7989 Other specified soft tissue disorders: Secondary | ICD-10-CM | POA: Diagnosis not present

## 2019-02-03 DIAGNOSIS — Z794 Long term (current) use of insulin: Secondary | ICD-10-CM

## 2019-02-03 DIAGNOSIS — E1122 Type 2 diabetes mellitus with diabetic chronic kidney disease: Secondary | ICD-10-CM | POA: Diagnosis present

## 2019-02-03 DIAGNOSIS — I120 Hypertensive chronic kidney disease with stage 5 chronic kidney disease or end stage renal disease: Secondary | ICD-10-CM | POA: Diagnosis present

## 2019-02-03 DIAGNOSIS — N186 End stage renal disease: Secondary | ICD-10-CM

## 2019-02-03 DIAGNOSIS — Z20828 Contact with and (suspected) exposure to other viral communicable diseases: Secondary | ICD-10-CM | POA: Diagnosis present

## 2019-02-03 DIAGNOSIS — Z7951 Long term (current) use of inhaled steroids: Secondary | ICD-10-CM | POA: Diagnosis not present

## 2019-02-03 DIAGNOSIS — Y95 Nosocomial condition: Secondary | ICD-10-CM | POA: Diagnosis present

## 2019-02-03 DIAGNOSIS — R6510 Systemic inflammatory response syndrome (SIRS) of non-infectious origin without acute organ dysfunction: Secondary | ICD-10-CM

## 2019-02-03 DIAGNOSIS — E875 Hyperkalemia: Secondary | ICD-10-CM | POA: Diagnosis present

## 2019-02-03 DIAGNOSIS — A419 Sepsis, unspecified organism: Secondary | ICD-10-CM | POA: Diagnosis present

## 2019-02-03 DIAGNOSIS — B1920 Unspecified viral hepatitis C without hepatic coma: Secondary | ICD-10-CM | POA: Diagnosis present

## 2019-02-03 DIAGNOSIS — N2581 Secondary hyperparathyroidism of renal origin: Secondary | ICD-10-CM | POA: Diagnosis present

## 2019-02-03 DIAGNOSIS — D631 Anemia in chronic kidney disease: Secondary | ICD-10-CM | POA: Diagnosis present

## 2019-02-03 DIAGNOSIS — J189 Pneumonia, unspecified organism: Secondary | ICD-10-CM | POA: Diagnosis not present

## 2019-02-03 DIAGNOSIS — F1911 Other psychoactive substance abuse, in remission: Secondary | ICD-10-CM | POA: Diagnosis present

## 2019-02-03 DIAGNOSIS — F17210 Nicotine dependence, cigarettes, uncomplicated: Secondary | ICD-10-CM | POA: Diagnosis present

## 2019-02-03 DIAGNOSIS — Z7982 Long term (current) use of aspirin: Secondary | ICD-10-CM

## 2019-02-03 DIAGNOSIS — J069 Acute upper respiratory infection, unspecified: Secondary | ICD-10-CM | POA: Diagnosis not present

## 2019-02-03 LAB — COMPREHENSIVE METABOLIC PANEL
ALT: 14 U/L (ref 0–44)
AST: 31 U/L (ref 15–41)
Albumin: 2.3 g/dL — ABNORMAL LOW (ref 3.5–5.0)
Alkaline Phosphatase: 119 U/L (ref 38–126)
Anion gap: 15 (ref 5–15)
BUN: 50 mg/dL — ABNORMAL HIGH (ref 8–23)
CO2: 20 mmol/L — ABNORMAL LOW (ref 22–32)
Calcium: 8.9 mg/dL (ref 8.9–10.3)
Chloride: 102 mmol/L (ref 98–111)
Creatinine, Ser: 9.02 mg/dL — ABNORMAL HIGH (ref 0.44–1.00)
GFR calc Af Amer: 5 mL/min — ABNORMAL LOW (ref >60)
GFR calc non Af Amer: 4 mL/min — ABNORMAL LOW (ref >60)
Glucose, Bld: 94 mg/dL (ref 70–99)
Potassium: 6.1 mmol/L — ABNORMAL HIGH (ref 3.5–5.1)
Sodium: 137 mmol/L (ref 135–145)
Total Bilirubin: 0.6 mg/dL (ref 0.3–1.2)
Total Protein: 7.4 g/dL (ref 6.5–8.1)

## 2019-02-03 LAB — CBC WITH DIFFERENTIAL/PLATELET
Abs Immature Granulocytes: 0.08 10*3/uL — ABNORMAL HIGH (ref 0.00–0.07)
Basophils Absolute: 0.1 10*3/uL (ref 0.0–0.1)
Basophils Relative: 0 %
Eosinophils Absolute: 0 10*3/uL (ref 0.0–0.5)
Eosinophils Relative: 0 %
HCT: 26.6 % — ABNORMAL LOW (ref 36.0–46.0)
Hemoglobin: 8.1 g/dL — ABNORMAL LOW (ref 12.0–15.0)
Immature Granulocytes: 1 %
Lymphocytes Relative: 19 %
Lymphs Abs: 3 10*3/uL (ref 0.7–4.0)
MCH: 22.6 pg — ABNORMAL LOW (ref 26.0–34.0)
MCHC: 30.5 g/dL (ref 30.0–36.0)
MCV: 74.1 fL — ABNORMAL LOW (ref 80.0–100.0)
Monocytes Absolute: 1.2 10*3/uL — ABNORMAL HIGH (ref 0.1–1.0)
Monocytes Relative: 8 %
Neutro Abs: 11.7 10*3/uL — ABNORMAL HIGH (ref 1.7–7.7)
Neutrophils Relative %: 72 %
Platelets: 319 10*3/uL (ref 150–400)
RBC: 3.59 MIL/uL — ABNORMAL LOW (ref 3.87–5.11)
RDW: 21 % — ABNORMAL HIGH (ref 11.5–15.5)
WBC: 16 10*3/uL — ABNORMAL HIGH (ref 4.0–10.5)
nRBC: 0 % (ref 0.0–0.2)

## 2019-02-03 LAB — BRAIN NATRIURETIC PEPTIDE: B Natriuretic Peptide: >4500 pg/mL — ABNORMAL HIGH (ref 0.0–100.0)

## 2019-02-03 LAB — LACTIC ACID, PLASMA
Lactic Acid, Venous: 2 mmol/L (ref 0.5–1.9)
Lactic Acid, Venous: 1.5 mmol/L (ref 0.5–1.9)

## 2019-02-03 LAB — GLUCOSE, CAPILLARY
Glucose-Capillary: 102 mg/dL — ABNORMAL HIGH (ref 70–99)
Glucose-Capillary: 100 mg/dL — ABNORMAL HIGH (ref 70–99)

## 2019-02-03 LAB — SARS CORONAVIRUS 2 BY RT PCR (HOSPITAL ORDER, PERFORMED IN ~~LOC~~ HOSPITAL LAB): SARS Coronavirus 2: NEGATIVE

## 2019-02-03 LAB — TROPONIN I: Troponin I: 0.08 ng/mL (ref <0.03)

## 2019-02-03 MED ORDER — SODIUM CHLORIDE 0.9% FLUSH: 3.0000 mL | INTRAVENOUS | Status: DC | PRN

## 2019-02-03 MED ORDER — SODIUM CHLORIDE 0.9 % IV SOLN
2.0000 g | Freq: Once | INTRAVENOUS | Status: AC
  Administered 2019-02-03: 2 g via INTRAVENOUS
  Filled 2019-02-03: qty 2

## 2019-02-03 MED ORDER — CALCITRIOL 0.25 MCG PO CAPS
0.2500 ug | ORAL_CAPSULE | ORAL | Status: DC
  Administered 2019-02-04: 20:00:00 0.25 ug via ORAL

## 2019-02-03 MED ORDER — FENTANYL CITRATE (PF) 100 MCG/2ML IJ SOLN
50.0000 ug | Freq: Once | INTRAMUSCULAR | Status: AC
  Administered 2019-02-03: 50 ug via INTRAVENOUS
  Filled 2019-02-03: qty 2

## 2019-02-03 MED ORDER — ASPIRIN EC 81 MG PO TBEC
81.0000 mg | DELAYED_RELEASE_TABLET | Freq: Every day | ORAL | Status: DC
  Administered 2019-02-04 – 2019-02-07 (×4): 81 mg via ORAL
  Filled 2019-02-03 (×4): qty 1

## 2019-02-03 MED ORDER — ONDANSETRON HCL 4 MG PO TABS: 4.0000 mg | ORAL_TABLET | Freq: Four times a day (QID) | ORAL | Status: DC | PRN

## 2019-02-03 MED ORDER — OXYCODONE HCL 5 MG PO TABS
5.0000 mg | ORAL_TABLET | Freq: Four times a day (QID) | ORAL | Status: DC | PRN
  Administered 2019-02-03 – 2019-02-07 (×9): 5 mg via ORAL
  Filled 2019-02-03 (×8): qty 1

## 2019-02-03 MED ORDER — INSULIN ASPART 100 UNIT/ML ~~LOC~~ SOLN
0.0000 [IU] | Freq: Three times a day (TID) | SUBCUTANEOUS | Status: DC
  Administered 2019-02-04: 12:00:00 2 [IU] via SUBCUTANEOUS
  Administered 2019-02-04: 17:00:00 3 [IU] via SUBCUTANEOUS

## 2019-02-03 MED ORDER — SODIUM ZIRCONIUM CYCLOSILICATE 10 G PO PACK
10.0000 g | PACK | Freq: Once | ORAL | Status: DC
  Filled 2019-02-03 (×2): qty 1

## 2019-02-03 MED ORDER — INSULIN GLARGINE 100 UNIT/ML ~~LOC~~ SOLN
5.0000 [IU] | Freq: Every day | SUBCUTANEOUS | Status: DC
  Administered 2019-02-04 – 2019-02-06 (×3): 5 [IU] via SUBCUTANEOUS
  Filled 2019-02-03 (×5): qty 0.05

## 2019-02-03 MED ORDER — ONDANSETRON HCL 4 MG/2ML IJ SOLN
4.0000 mg | Freq: Four times a day (QID) | INTRAMUSCULAR | Status: DC | PRN
  Administered 2019-02-05: 4 mg via INTRAVENOUS
  Filled 2019-02-03: qty 2

## 2019-02-03 MED ORDER — LORAZEPAM 2 MG/ML IJ SOLN
1.0000 mg | INTRAMUSCULAR | Status: DC | PRN
  Administered 2019-02-03 – 2019-02-04 (×3): 1 mg via INTRAVENOUS
  Filled 2019-02-03 (×3): qty 1

## 2019-02-03 MED ORDER — SODIUM CHLORIDE 0.9 % IV SOLN
250.0000 mL | INTRAVENOUS | Status: DC | PRN
  Administered 2019-02-05 – 2019-02-06 (×2): 250 mL via INTRAVENOUS

## 2019-02-03 MED ORDER — PANTOPRAZOLE SODIUM 40 MG PO TBEC
40.0000 mg | DELAYED_RELEASE_TABLET | Freq: Every day | ORAL | Status: DC
  Administered 2019-02-04 – 2019-02-07 (×4): 40 mg via ORAL
  Filled 2019-02-03 (×4): qty 1

## 2019-02-03 MED ORDER — VANCOMYCIN HCL 10 G IV SOLR
1250.0000 mg | Freq: Once | INTRAVENOUS | Status: AC
  Administered 2019-02-03: 1250 mg via INTRAVENOUS
  Filled 2019-02-03 (×2): qty 1250

## 2019-02-03 MED ORDER — HEPARIN SODIUM (PORCINE) 5000 UNIT/ML IJ SOLN
5000.0000 [IU] | Freq: Two times a day (BID) | INTRAMUSCULAR | Status: DC
  Administered 2019-02-03 – 2019-02-06 (×7): 5000 [IU] via SUBCUTANEOUS
  Filled 2019-02-03 (×8): qty 1

## 2019-02-03 MED ORDER — VANCOMYCIN VARIABLE DOSE PER UNSTABLE RENAL FUNCTION (PHARMACIST DOSING): Status: DC

## 2019-02-03 MED ORDER — ACETAMINOPHEN 650 MG RE SUPP
650.0000 mg | Freq: Four times a day (QID) | RECTAL | Status: DC | PRN
  Administered 2019-02-03: 650 mg via RECTAL
  Filled 2019-02-03: qty 1

## 2019-02-03 MED ORDER — VANCOMYCIN HCL IN DEXTROSE 1-5 GM/200ML-% IV SOLN: 1000.0000 mg | Freq: Once | INTRAVENOUS | Status: DC

## 2019-02-03 MED ORDER — POLYETHYLENE GLYCOL 3350 17 G PO PACK
17.0000 g | PACK | Freq: Every day | ORAL | Status: DC
  Administered 2019-02-04 – 2019-02-07 (×3): 17 g via ORAL
  Filled 2019-02-03 (×4): qty 1

## 2019-02-03 MED ORDER — DEXTROSE 50 % IV SOLN
1.0000 | Freq: Once | INTRAVENOUS | Status: AC
  Administered 2019-02-03: 50 mL via INTRAVENOUS
  Filled 2019-02-03: qty 50

## 2019-02-03 MED ORDER — IOHEXOL 350 MG/ML SOLN
100.0000 mL | Freq: Once | INTRAVENOUS | Status: AC | PRN
  Administered 2019-02-03: 100 mL via INTRAVENOUS

## 2019-02-03 MED ORDER — HYDRALAZINE HCL 20 MG/ML IJ SOLN
10.0000 mg | INTRAMUSCULAR | Status: DC | PRN
  Administered 2019-02-03: 10 mg via INTRAVENOUS
  Filled 2019-02-03: qty 1

## 2019-02-03 MED ORDER — FLUTICASONE PROPIONATE 50 MCG/ACT NA SUSP
2.0000 | Freq: Every day | NASAL | Status: DC
  Administered 2019-02-04 – 2019-02-07 (×3): 2 via NASAL
  Filled 2019-02-03: qty 16

## 2019-02-03 MED ORDER — ACETAMINOPHEN 325 MG PO TABS
650.0000 mg | ORAL_TABLET | Freq: Four times a day (QID) | ORAL | Status: DC | PRN
  Filled 2019-02-03: qty 2

## 2019-02-03 MED ORDER — INSULIN ASPART 100 UNIT/ML ~~LOC~~ SOLN
5.0000 [IU] | Freq: Once | SUBCUTANEOUS | Status: AC
  Administered 2019-02-03: 5 [IU] via INTRAVENOUS

## 2019-02-03 MED ORDER — INSULIN ASPART 100 UNIT/ML ~~LOC~~ SOLN: 0.0000 [IU] | Freq: Every day | SUBCUTANEOUS | Status: DC

## 2019-02-03 MED ORDER — NICOTINE 21 MG/24HR TD PT24: 21.0000 mg | MEDICATED_PATCH | Freq: Every day | TRANSDERMAL | Status: DC

## 2019-02-03 MED ORDER — ALBUTEROL SULFATE HFA 108 (90 BASE) MCG/ACT IN AERS
8.0000 | INHALATION_SPRAY | Freq: Once | RESPIRATORY_TRACT | Status: AC
  Administered 2019-02-03: 8 via RESPIRATORY_TRACT
  Filled 2019-02-03: qty 6.7

## 2019-02-03 MED ORDER — AMLODIPINE BESYLATE 5 MG PO TABS
5.0000 mg | ORAL_TABLET | Freq: Every day | ORAL | Status: DC
  Administered 2019-02-04 – 2019-02-06 (×3): 5 mg via ORAL
  Filled 2019-02-03 (×4): qty 1

## 2019-02-03 MED ORDER — SEVELAMER CARBONATE 800 MG PO TABS
1600.0000 mg | ORAL_TABLET | Freq: Three times a day (TID) | ORAL | Status: DC
  Administered 2019-02-04 – 2019-02-07 (×6): 1600 mg via ORAL
  Filled 2019-02-03 (×6): qty 2

## 2019-02-03 MED ORDER — CHLORHEXIDINE GLUCONATE CLOTH 2 % EX PADS
6.0000 | MEDICATED_PAD | Freq: Every day | CUTANEOUS | Status: DC
  Administered 2019-02-06: 06:00:00 6 via TOPICAL

## 2019-02-03 MED ORDER — SODIUM CHLORIDE 0.9% FLUSH
3.0000 mL | Freq: Two times a day (BID) | INTRAVENOUS | Status: DC
  Administered 2019-02-03 – 2019-02-06 (×5): 3 mL via INTRAVENOUS

## 2019-02-03 NOTE — Consult Note (Addendum)
[facility] Renal Consultation Note    Indication for Consultation:  Management of ESRD/hemodialysis, anemia, hypertension/volume, and secondary hyperparathyroidism. PCP:  HPI: Rhonda Lynch is a 63 y.o. female with ESRD, HTN, Type 2 DM, Hep C, Hx CVA, and non-compliance with dialysis is being admitted with sepsis and hyperkalemia.   Presented to ED with 1 day of cough, fever, chills, and dyspnea. Labs showed Na 137, K 6.1, CO2 20, Ca 8.9, WBC 16, Hgb 8.1, Plts 319, LA 2, BNP > 4500. CXR + pulm edema. CT angio completed, but read is pending at time of this consult. Rapid COVID test negative.  Dialyzes TTS at [facility], but historically very non-compliant. Often skips and when she does come, she typically shortens her time. Last dialyzed on 5/23 but only for 2hr 53min - left 2.4kg above her assigned dry weight.  Past Medical History:  Diagnosis Date  . Diabetes mellitus without complication ([place])   . ESRD (end stage renal disease) on dialysis ([place])    "TTS; [place]; they're moving me to another one" (09/22/2018)  . Hepatitis C    diagnosed 09/2018  . Hypertension   . Oxygen deficiency    2 L at night  . Stroke ([place])   . Substance abuse ([place])    has been clean for 4 months    Past Surgical History:  Procedure Laterality Date  . AV FISTULA PLACEMENT Left 03/05/2018   Procedure: ARTERIOVENOUS (AV) FISTULA CREATION  LEFT UPPER EXTREMITY;  Surgeon: [name], MD;  Location: [place];  Service: Vascular;  Laterality: Left;  . AV FISTULA PLACEMENT Left 07/21/2018   Procedure: ARTERIOVENOUS (AV) FISTULA CREATION;  Surgeon: Marty Heck, MD;  Location: [place];  Service: Vascular;  Laterality: Left;  . AV FISTULA PLACEMENT Left 10/27/2018   Procedure: INSERTION OF ARTERIOVENOUS (AV) GORE-TEX GRAFT UPPER ARM;  Surgeon: Marty Heck, MD;  Location: [place];  Service: Vascular;  Laterality: Left;  . AV FISTULA PLACEMENT Right 01/19/2019   Procedure: INSERTION OF  ARTERIOVENOUS GRAFT RIGHT ARM;  Surgeon: [name], MD;  Location: [place];  Service: Vascular;  Laterality: Right;  . BASCILIC VEIN TRANSPOSITION Left 09/29/2018   Procedure: BASILIC VEIN TRANSPOSITION SECOND STAGE LEFT UPPER ARM;  Surgeon: Marty Heck, MD;  Location: [place];  Service: Vascular;  Laterality: Left;  . HEMATOMA EVACUATION Left 03/06/2018   Procedure: EVACUATION HEMATOMA LEFT ARM;  Surgeon: Angelia Mould, MD;  Location: [place];  Service: Vascular;  Laterality: Left;  . I&D EXTREMITY Left 10/06/2018   Procedure: IRRIGATION AND DEBRIDEMENT ARM;  Surgeon: Marty Heck, MD;  Location: [place];  Service: Vascular;  Laterality: Left;  . INSERTION OF DIALYSIS CATHETER Left 04/29/2018   Procedure: INSERTION OF DIALYSIS CATHETER LEFT INTERNAL JUGULAR;  Surgeon: Angelia Mould, MD;  Location: [place];  Service: Vascular;  Laterality: Left;  . INSERTION OF DIALYSIS CATHETER Right 01/19/2019   Procedure: INSERTION OF HEMO DIALYSIS  PALINDROME TUNNELED CATHETER;  Surgeon: [name], MD;  Location: [facility] OR;  Service: Vascular;  Laterality: Right;  . IR FLUORO GUIDE CV LINE RIGHT  02/18/2018  . IR FLUORO GUIDE CV LINE RIGHT  02/26/2018  . IR FLUORO GUIDE CV LINE RIGHT  01/14/2019  . IR REMOVAL TUN CV CATH W/O FL  04/27/2018  . IR US GUIDE VASC ACCESS RIGHT  02/18/2018  . IR US GUIDE VASC ACCESS RIGHT  01/14/2019  . TEE WITHOUT CARDIOVERSION N/A 04/29/2018   Procedure:  TRANSESOPHAGEAL ECHOCARDIOGRAM (TEE);  Surgeon: Sueanne Margarita, MD;  Location: [facility] ENDOSCOPY;  Service: Cardiovascular;  Laterality: N/A;  . THROMBECTOMY AND REVISION OF ARTERIOVENTOUS (AV) GORETEX  GRAFT Left 12/11/2018   Procedure: THROMBECTOMY AND  REVISION  ARTERIOVENTOUS GORETEX  GRAFT LEFT ARM;  Surgeon: [name], MD;  Location: [place];  Service: Vascular;  Laterality: Left;  Marland Kitchen VENOGRAM Left 10/27/2018   Procedure: LEFT CENTRAL VENOGRAM;  Surgeon: Marty Heck, MD;  Location: [facility] OR;  Service:  Vascular;  Laterality: Left;   Family History  Problem Relation Age of Onset  . Autoimmune disease Neg Hx    Social History:  reports that she has been smoking cigarettes. She has a 24.00 pack-year smoking history. She has never used smokeless tobacco. She reports previous alcohol use. She reports current drug use. Drugs: Cocaine and Heroin.  ROS: As per HPI otherwise negative.  Physical Exam: Vitals:   02/03/19 1315 02/03/19 1316  BP: (!) 172/94   Pulse: (!) 107   Temp: 99.9 F (37.7 C)   TempSrc: Oral   SpO2: 93%   Weight:  57 kg  Height:  5\' 1"  (1.549 m)     EXAM:  alert but looks tired, tremulous, no asterixis, nasal O2, not in distress  No jvd Chest diffuse coarse rales throughout both lung fields  Cor RRR  ABd soft no ascites Ext no LE or UE edema   R IJ TDC intact w/ clean exit   RUA AVF+strong bruit/ thrill, not in use  Allergies  Allergen Reactions  . Acetaminophen Nausea And Vomiting    Patient states Acetaminophen and Acetaminophen containing products make her nauseated. She demonstrated this 06/21/18 with nausea followed by emesis.  Marland Kitchen Hydroxyzine Itching   Prior to Admission medications   Medication Sig Start Date End Date Taking? Authorizing Provider  aspirin EC 81 MG EC tablet Take 1 tablet (81 mg total) by mouth daily. 01/20/19  Yes Pokhrel, Laxman, MD  B Complex-C-Zn-Folic Acid (DIALYVITE 825-KNLZ 15) 0.8 MG TABS Take 1 tablet by mouth daily. 11/04/18  Yes [provider]  calcitRIOL (ROCALTROL) 0.25 MCG capsule Take 1 capsule (0.25 mcg total) by mouth every Monday, Wednesday, and Friday with hemodialysis. 10/29/18  Yes Isabelle Course, MD  diphenhydrAMINE (BENADRYL) 2 % cream Apply 1 application topically 3 (three) times daily as needed for itching.   Yes [provider]  fluticasone (FLONASE) 50 MCG/ACT nasal spray Place 2 sprays into both nostrils daily.   Yes [provider]  gabapentin (NEURONTIN) 100 MG capsule Take 1 capsule  (100 mg total) by mouth 3 (three) times daily for 30 days. 01/20/19 02/19/19 Yes Pokhrel, Laxman, MD  hydrocortisone cream 1 % Apply 1 application topically 2 (two) times daily. Patient taking differently: Apply 1 application topically 2 (two) times daily as needed for itching.  01/20/19  Yes Pokhrel, Laxman, MD  insulin aspart (NOVOLOG) 100 UNIT/ML injection Inject 5 Units into the skin 3 (three) times daily with meals. Patient taking differently: Inject 0-5 Units into the skin 3 (three) times daily with meals.  10/13/18  Yes Alphonzo Grieve, MD  insulin glargine (LANTUS) 100 UNIT/ML injection Inject 0.03 mLs (3 Units total) into the skin at bedtime. Patient taking differently: Inject 5 Units into the skin at bedtime.  10/29/18  Yes Isabelle Course, MD  lidocaine-prilocaine (EMLA) cream Apply 1 application topically See admin instructions. Apply small amount to access site (AVF)before dialysis. Cover with occlusive dressing (Saran wrap) 12/29/18  Yes  [provider]  oxyCODONE (OXY IR/ROXICODONE) 5 MG immediate release tablet Take 1 tablet (5 mg total) by mouth every 6 (six) hours as needed for moderate pain or severe pain. 01/20/19  Yes Pokhrel, Laxman, MD  sevelamer carbonate (RENVELA) 800 MG tablet Take 2 tablets (1,600 mg total) by mouth 3 (three) times daily with meals. 11/26/18  Yes Dana Allan I, MD  amLODipine (NORVASC) 5 MG tablet Take 1 tablet (5 mg total) by mouth at bedtime. Patient not taking: Reported on 02/03/2019 10/13/18   Alphonzo Grieve, MD  Blood Glucose Monitoring Suppl (ACCU-CHEK AVIVA) device Use as instructed 10/29/18 10/29/19  Isabelle Course, MD  glucose blood (ACCU-CHEK AVIVA) test strip Use twice per day. 10/29/18   Isabelle Course, MD  Insulin Syringe-Needle U-100 (INSULIN SYRINGE .3CC/31GX5/16") 31G X 5/16" 0.3 ML MISC Inject 1 each into the skin 4 (four) times daily. 06/27/18   Clent Demark, PA-C  Lancets (ACCU-CHEK SOFT [facility]) lancets Use twice per day. 10/29/18    Isabelle Course, MD  nicotine (NICODERM CQ - DOSED IN MG/24 HOURS) 21 mg/24hr patch Place 1 patch (21 mg total) onto the skin daily. Patient not taking: Reported on 02/03/2019 01/20/19   Pokhrel, Corrie Mckusick, MD  pantoprazole (PROTONIX) 40 MG tablet Take 1 tablet (40 mg total) by mouth daily. Patient not taking: Reported on 02/03/2019 11/21/18   Jonetta Osgood, MD  polyethylene glycol (MIRALAX / GLYCOLAX) 17 g packet Take 17 g by mouth daily. Patient not taking: Reported on 02/03/2019 12/29/18   Thurnell Lose, MD   Current Facility-Administered Medications  Medication Dose Route Frequency Provider Last Rate Last Dose  . [START ON 02/04/2019] Chlorhexidine Gluconate Cloth 2 % PADS 6 each  6 each Topical Q0600 Loren Racer, PA-C      . vancomycin (VANCOCIN) 1,250 mg in sodium chloride 0.9 % 250 mL IVPB  1,250 mg Intravenous Once Duffy Bruce, MD      . vancomycin variable dose per unstable renal function (pharmacist dosing)   Does not apply See admin instructions Merton Border, MD       Current Outpatient Medications  Medication Sig Dispense Refill  . aspirin EC 81 MG EC tablet Take 1 tablet (81 mg total) by mouth daily. 30 tablet 2  . B Complex-C-Zn-Folic Acid (DIALYVITE 885-OYDX 15) 0.8 MG TABS Take 1 tablet by mouth daily.    . calcitRIOL (ROCALTROL) 0.25 MCG capsule Take 1 capsule (0.25 mcg total) by mouth every Monday, Wednesday, and Friday with hemodialysis. 15 capsule 0  . diphenhydrAMINE (BENADRYL) 2 % cream Apply 1 application topically 3 (three) times daily as needed for itching.    . fluticasone (FLONASE) 50 MCG/ACT nasal spray Place 2 sprays into both nostrils daily.    Marland Kitchen gabapentin (NEURONTIN) 100 MG capsule Take 1 capsule (100 mg total) by mouth 3 (three) times daily for 30 days. 90 capsule 2  . hydrocortisone cream 1 % Apply 1 application topically 2 (two) times daily. (Patient taking differently: Apply 1 application topically 2 (two) times daily as needed for itching. ) 30 g 0   . insulin aspart (NOVOLOG) 100 UNIT/ML injection Inject 5 Units into the skin 3 (three) times daily with meals. (Patient taking differently: Inject 0-5 Units into the skin 3 (three) times daily with meals. ) 10 mL 0  . insulin glargine (LANTUS) 100 UNIT/ML injection Inject 0.03 mLs (3 Units total) into the skin at bedtime. (Patient taking differently: Inject 5 Units into the skin at  bedtime. ) 10 mL 0  . lidocaine-prilocaine (EMLA) cream Apply 1 application topically See admin instructions. Apply small amount to access site (AVF)before dialysis. Cover with occlusive dressing (Saran wrap)    . oxyCODONE (OXY IR/ROXICODONE) 5 MG immediate release tablet Take 1 tablet (5 mg total) by mouth every 6 (six) hours as needed for moderate pain or severe pain. 15 tablet 0  . sevelamer carbonate (RENVELA) 800 MG tablet Take 2 tablets (1,600 mg total) by mouth 3 (three) times daily with meals. 90 tablet 0  . amLODipine (NORVASC) 5 MG tablet Take 1 tablet (5 mg total) by mouth at bedtime. (Patient not taking: Reported on 02/03/2019) 30 tablet 0  . Blood Glucose Monitoring Suppl (ACCU-CHEK AVIVA) device Use as instructed 1 each 0  . glucose blood (ACCU-CHEK AVIVA) test strip Use twice per day. 100 each 0  . Insulin Syringe-Needle U-100 (INSULIN SYRINGE .3CC/31GX5/16") 31G X 5/16" 0.3 ML MISC Inject 1 each into the skin 4 (four) times daily. 120 each 5  . Lancets (ACCU-CHEK SOFT TOUCH) lancets Use twice per day. 100 each 0  . nicotine (NICODERM CQ - DOSED IN MG/24 HOURS) 21 mg/24hr patch Place 1 patch (21 mg total) onto the skin daily. (Patient not taking: Reported on 02/03/2019) 28 patch 0  . pantoprazole (PROTONIX) 40 MG tablet Take 1 tablet (40 mg total) by mouth daily. (Patient not taking: Reported on 02/03/2019) 30 tablet 0  . polyethylene glycol (MIRALAX / GLYCOLAX) 17 g packet Take 17 g by mouth daily. (Patient not taking: Reported on 02/03/2019) 14 each 0   Labs: Basic Metabolic Panel: Recent Labs  Lab  02/03/19 1400  NA 137  K 6.1*  CL 102  CO2 20*  GLUCOSE 94  BUN 50*  CREATININE 9.02*  CALCIUM 8.9   Liver Function Tests: Recent Labs  Lab 02/03/19 1400  AST 31  ALT 14  ALKPHOS 119  BILITOT 0.6  PROT 7.4  ALBUMIN 2.3*   CBC: Recent Labs  Lab 02/03/19 1400  WBC 16.0*  NEUTROABS 11.7*  HGB 8.1*  HCT 26.6*  MCV 74.1*  PLT 319   Cardiac Enzymes: Recent Labs  Lab 02/03/19 1400  TROPONINI 0.08*   Studies/Results: Dg Chest Portable 1 View  Result Date: 02/03/2019 CLINICAL DATA:  Shortness of breath. EXAM: PORTABLE CHEST 1 VIEW COMPARISON:  Radiograph of Jan 19, 2019. FINDINGS: Stable cardiomegaly. Right internal jugular dialysis catheter is unchanged in position. No pneumothorax or pleural effusion is noted. Stable bilateral reticular densities are noted concerning for edema. Minimal pleural effusions may be present. Bony thorax is unremarkable. IMPRESSION: Stable bilateral reticular densities are noted concerning for pulmonary edema. Minimal pleural effusions. Electronically Signed   By: Marijo Conception M.D.   On: 02/03/2019 13:52   Dialysis Orders:  TTS [place] 4hr, 400/800, EDW 54kg, 2K/2.5Ca, TDC, heparin 3000 - Hectoral 71mcg Iv q HD - Mircera 200 q 2 weeks (last given 171mcg 5/14) - Venofer 100mg  x 10 ordered (s/p 3 doses so far)  Assessment/Plan: 1.  Pulm edema v. HCAP: Low grade fever on admit - CT angio pending. Will maximize UF with HD, on Vanc/Cefepime. COVID negative. 2.  Hyperkalemia: K 6.1 - for semi-urgent dialysis today to correct. 3.  ESRD: Usual TTS schedule - HD today as above. Reassess in AM to determine if needs serial  treatments. 4.  Hypertension/volume: BP high, should improve with UF. 5.  Anemia: Hgb 8.1, low but actually improved from outpt Hgb 7.6 on 5/21 - not  due for ESA yet. 6.  Metabolic bone disease: Ca ok, Phos pending. Resume home binders. 7.  T2DM 8.  Hx CVA  Pollyann Kennedy 02/03/2019, 4:16 PM  [facility] Pager: [phone]  Pt seen, examined and agree w A/P as above. ESRd pt w/ SOB, pulm edema CXR appearance, signs off early , left 2-3 kg over on Sat 3d ago. Suspect vol overload. HD asap this evening, large UF as tolerated.  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  Kelly Splinter  MD 02/03/2019, 5:06 PM

## 2019-02-03 NOTE — H&P (Signed)
[facility]                                                                                    Patient Demographics  Rhonda Lynch, is a 63 y.o. female  CSN: [id number]  MRN: [id number]  DOB - 01-Jun-1956  Admit Date - 02/03/2019  Outpatient Primary MD for the patient is Clent Demark, PA-C   With History of -  Past Medical History:  Diagnosis Date  . Diabetes mellitus without complication ([place])   . ESRD (end stage renal disease) on dialysis ([place])    "TTS; [place]; they're moving me to another one" (09/22/2018)  . Hepatitis C    diagnosed 09/2018  . Hypertension   . Oxygen deficiency    2 L at night  . Stroke ([place])   . Substance abuse ([place])    has been clean for 4 months       Past Surgical History:  Procedure Laterality Date  . AV FISTULA PLACEMENT Left 03/05/2018   Procedure: ARTERIOVENOUS (AV) FISTULA CREATION  LEFT UPPER EXTREMITY;  Surgeon: [name], MD;  Location: [place];  Service: Vascular;  Laterality: Left;  . AV FISTULA PLACEMENT Left 07/21/2018   Procedure: ARTERIOVENOUS (AV) FISTULA CREATION;  Surgeon: Marty Heck, MD;  Location: [place];  Service: Vascular;  Laterality: Left;  . AV FISTULA PLACEMENT Left 10/27/2018   Procedure: INSERTION OF ARTERIOVENOUS (AV) GORE-TEX GRAFT UPPER ARM;  Surgeon: Marty Heck, MD;  Location: [place];  Service: Vascular;  Laterality: Left;  . AV FISTULA PLACEMENT Right 01/19/2019   Procedure: INSERTION OF ARTERIOVENOUS GRAFT RIGHT ARM;  Surgeon: [name], MD;  Location: [place];  Service: Vascular;  Laterality: Right;  . BASCILIC VEIN TRANSPOSITION Left 09/29/2018   Procedure: BASILIC VEIN TRANSPOSITION SECOND STAGE LEFT UPPER ARM;  Surgeon: Marty Heck, MD;  Location: [place];  Service: Vascular;  Laterality: Left;  . HEMATOMA EVACUATION Left 03/06/2018   Procedure: EVACUATION HEMATOMA LEFT ARM;  Surgeon: Angelia Mould, MD;  Location: [place];  Service: Vascular;  Laterality:  Left;  . I&D EXTREMITY Left 10/06/2018   Procedure: IRRIGATION AND DEBRIDEMENT ARM;  Surgeon: Marty Heck, MD;  Location: [place];  Service: Vascular;  Laterality: Left;  . INSERTION OF DIALYSIS CATHETER Left 04/29/2018   Procedure: INSERTION OF DIALYSIS CATHETER LEFT INTERNAL JUGULAR;  Surgeon: Angelia Mould, MD;  Location: [place];  Service: Vascular;  Laterality: Left;  . INSERTION OF DIALYSIS CATHETER Right 01/19/2019   Procedure: INSERTION OF HEMO DIALYSIS  PALINDROME TUNNELED CATHETER;  Surgeon: [name], MD;  Location[facility];  Service: Vascular;  Laterality: Right;  . IR FLUORO GUIDE CV LINE RIGHT  02/18/2018  . IR FLUORO GUIDE CV LINE RIGHT  02/26/2018  . IR FLUORO GUIDE CV LINE RIGHT  01/14/2019  . IR REMOVAL TUN CV CATH W/O FL  04/27/2018  . IR US GUIDE VASC ACCESS RIGHT  02/18/2018  . IR US GUIDE VASC ACCESS RIGHT  01/14/2019  . TEE WITHOUT CARDIOVERSION N/A 04/29/2018   Procedure: TRANSESOPHAGEAL ECHOCARDIOGRAM (TEE);  Surgeon: Sueanne Margarita, MD;  Location: [facility];  Service: Cardiovascular;  Laterality: N/A;  . THROMBECTOMY AND REVISION OF ARTERIOVENTOUS (AV) GORETEX  GRAFT Left 12/11/2018   Procedure: THROMBECTOMY AND  REVISION  ARTERIOVENTOUS GORETEX  GRAFT LEFT ARM;  Surgeon: [name], MD;  Location: [place];  Service: Vascular;  Laterality: Left;  Marland Kitchen VENOGRAM Left 10/27/2018   Procedure: LEFT CENTRAL VENOGRAM;  Surgeon: Marty Heck, MD;  Location: [place];  Service: Vascular;  Laterality: Left;    in for   Chief Complaint  Patient presents with  . Abdominal Pain  . Cough  . Shortness of Breath  . Nausea     HPI  Rhonda Lynch  is a 63 y.o. female, with past medical history significant for end-stage renal disease on hemodialysis, noncompliance, history of drug abuse and hypertension presenting with left chest wall pain, pleuritic associated with increased shortness of breath, fever chills and productive cough.  Her last hemodialysis was on Saturday  and was sent to hemodialysis center today where she was evaluated sent to the emergency room due to fever. Work-up in the emergency room showed fluid overload with BNP of 4500 and potassium of 6.1.  Patient had leukocytosis and looked tired .  Her COVID-19 test was negative and CT angiogram of chest showed multilobular process suggestive of fluid overload versus pneumonia.  No pulmonary embolism was noted    Review of Systems    In addition to the HPI above,   No Headache, No changes with Vision or hearing, No problems swallowing food or Liquids, No Abdominal pain,  No Blood in stool or Urine, No dysuria, No new skin rashes or bruises, No new joints pains-aches,  No new weakness, tingling, numbness in any extremity, No recent weight gain or loss, No polyuria, polydypsia or polyphagia, No significant Mental Stressors.  A full 10 point Review of Systems was done, except as stated above, all other Review of Systems were negative.   Social History Social History   Tobacco Use  . Smoking status: Current Every Day Smoker    Packs/day: 0.50    Years: 48.00    Pack years: 24.00    Types: Cigarettes  . Smokeless tobacco: Never Used  . Tobacco comment: trying to quit ; smoking 1 1/2 cigarettes/day  (09/22/2018)  Substance Use Topics  . Alcohol use: Not Currently    Comment: 09/22/2018 "stopped about 6 months ago"     Family History Family History  Problem Relation Age of Onset  . Autoimmune disease Neg Hx      Prior to Admission medications   Medication Sig Start Date End Date Taking? Authorizing Provider  aspirin EC 81 MG EC tablet Take 1 tablet (81 mg total) by mouth daily. 01/20/19  Yes Pokhrel, Laxman, MD  B Complex-C-Zn-Folic Acid (DIALYVITE 465-KPTW 15) 0.8 MG TABS Take 1 tablet by mouth daily. 11/04/18  Yes [provider]  calcitRIOL (ROCALTROL) 0.25 MCG capsule Take 1 capsule (0.25 mcg total) by mouth every Monday, Wednesday, and Friday with hemodialysis.  10/29/18  Yes Isabelle Course, MD  diphenhydrAMINE (BENADRYL) 2 % cream Apply 1 application topically 3 (three) times daily as needed for itching.   Yes [provider]  fluticasone (FLONASE) 50 MCG/ACT nasal spray Place 2 sprays into both nostrils daily.   Yes [provider]  gabapentin (NEURONTIN) 100 MG capsule Take 1 capsule (100 mg total) by mouth 3 (three) times daily for 30 days. 01/20/19 02/19/19 Yes Pokhrel, Laxman, MD  hydrocortisone cream 1 % Apply 1 application topically  2 (two) times daily. Patient taking differently: Apply 1 application topically 2 (two) times daily as needed for itching.  01/20/19  Yes Pokhrel, Laxman, MD  insulin aspart (NOVOLOG) 100 UNIT/ML injection Inject 5 Units into the skin 3 (three) times daily with meals. Patient taking differently: Inject 0-5 Units into the skin 3 (three) times daily with meals.  10/13/18  Yes Alphonzo Grieve, MD  insulin glargine (LANTUS) 100 UNIT/ML injection Inject 0.03 mLs (3 Units total) into the skin at bedtime. Patient taking differently: Inject 5 Units into the skin at bedtime.  10/29/18  Yes Isabelle Course, MD  lidocaine-prilocaine (EMLA) cream Apply 1 application topically See admin instructions. Apply small amount to access site (AVF)before dialysis. Cover with occlusive dressing (Saran wrap) 12/29/18  Yes [provider]  oxyCODONE (OXY IR/ROXICODONE) 5 MG immediate release tablet Take 1 tablet (5 mg total) by mouth every 6 (six) hours as needed for moderate pain or severe pain. 01/20/19  Yes Pokhrel, Laxman, MD  sevelamer carbonate (RENVELA) 800 MG tablet Take 2 tablets (1,600 mg total) by mouth 3 (three) times daily with meals. 11/26/18  Yes Dana Allan I, MD  amLODipine (NORVASC) 5 MG tablet Take 1 tablet (5 mg total) by mouth at bedtime. Patient not taking: Reported on 02/03/2019 10/13/18   Alphonzo Grieve, MD  Blood Glucose Monitoring Suppl (ACCU-CHEK AVIVA) device Use as instructed 10/29/18 10/29/19  Isabelle Course, MD  glucose blood (ACCU-CHEK AVIVA) test strip Use twice per day. 10/29/18   Isabelle Course, MD  Insulin Syringe-Needle U-100 (INSULIN SYRINGE .3CC/31GX5/16") 31G X 5/16" 0.3 ML MISC Inject 1 each into the skin 4 (four) times daily. 06/27/18   Clent Demark, PA-C  Lancets (ACCU-CHEK SOFT [facility]) lancets Use twice per day. 10/29/18   Isabelle Course, MD  nicotine (NICODERM CQ - DOSED IN MG/24 HOURS) 21 mg/24hr patch Place 1 patch (21 mg total) onto the skin daily. Patient not taking: Reported on 02/03/2019 01/20/19   Pokhrel, Corrie Mckusick, MD  pantoprazole (PROTONIX) 40 MG tablet Take 1 tablet (40 mg total) by mouth daily. Patient not taking: Reported on 02/03/2019 11/21/18   Jonetta Osgood, MD  polyethylene glycol (MIRALAX / GLYCOLAX) 17 g packet Take 17 g by mouth daily. Patient not taking: Reported on 02/03/2019 12/29/18   Thurnell Lose, MD    Allergies  Allergen Reactions  . Acetaminophen Nausea And Vomiting    Patient states Acetaminophen and Acetaminophen containing products make her nauseated. She demonstrated this 06/21/18 with nausea followed by emesis.  Marland Kitchen Hydroxyzine Itching    Physical Exam  Vitals  Blood pressure (!) 172/94, pulse (!) 107, temperature 99.9 F (37.7 C), temperature source Oral, height 5\' 1"  (1.549 m), weight 57 kg, SpO2 93 %.   1. General chronically ill female, looks tired  2. Normal affect and insight, Not Suicidal or Homicidal, Awake Alert, Oriented X 3.  3. No F.N deficits, grossly, patient moving all extremities.  4. Ears and Eyes appear Normal, Conjunctivae clear, PERRLA. Moist Oral Mucosa.  5. Supple Neck, No JVD, No cervical lymphadenopathy appriciated, No Carotid Bruits.  6. Symmetrical Chest wall movement, bilateral basal crackles and decreased breath sounds.  7. RRR, No Gallops, Rubs or Murmurs, No Parasternal Heave.  8. Positive Bowel Sounds, Abdomen Soft, Non tender, No organomegaly appriciated,No rebound -guarding or  rigidity.  9.  No Cyanosis, Normal Skin Turgor, No Skin Rash or Bruise.  10. Good muscle tone,  joints appear normal , no effusions, Normal  ROM.    Data Review  CBC Recent Labs  Lab 02/03/19 1400  WBC 16.0*  HGB 8.1*  HCT 26.6*  PLT 319  MCV 74.1*  MCH 22.6*  MCHC 30.5  RDW 21.0*  LYMPHSABS 3.0  MONOABS 1.2*  EOSABS 0.0  BASOSABS 0.1   ------------------------------------------------------------------------------------------------------------------  Chemistries  Recent Labs  Lab 02/03/19 1400  NA 137  K 6.1*  CL 102  CO2 20*  GLUCOSE 94  BUN 50*  CREATININE 9.02*  CALCIUM 8.9  AST 31  ALT 14  ALKPHOS 119  BILITOT 0.6   ------------------------------------------------------------------------------------------------------------------ estimated creatinine clearance is 4.7 mL/min (A) (by C-G formula based on SCr of 9.02 mg/dL (H)). ------------------------------------------------------------------------------------------------------------------ No results for input(s): TSH, T4TOTAL, T3FREE, THYROIDAB in the last 72 hours.  Invalid input(s): FREET3   Coagulation profile No results for input(s): INR, PROTIME in the last 168 hours. ------------------------------------------------------------------------------------------------------------------- No results for input(s): DDIMER in the last 72 hours. -------------------------------------------------------------------------------------------------------------------  Cardiac Enzymes Recent Labs  Lab 02/03/19 1400  TROPONINI 0.08*   ------------------------------------------------------------------------------------------------------------------ Invalid input(s): POCBNP   ---------------------------------------------------------------------------------------------------------------  Urinalysis    Component Value Date/Time   COLORURINE YELLOW 12/03/2018 1208   APPEARANCEUR HAZY (A) 12/03/2018 1208    LABSPEC 1.020 12/03/2018 1208   PHURINE 6.0 12/03/2018 1208   GLUCOSEU 100 (A) 12/03/2018 1208   HGBUR LARGE (A) 12/03/2018 1208   HGBUR trace-intact 03/08/2010 1452   BILIRUBINUR NEGATIVE 12/03/2018 1208   KETONESUR NEGATIVE 12/03/2018 1208   PROTEINUR >300 (A) 12/03/2018 1208   UROBILINOGEN 0.2 03/08/2010 1452   NITRITE NEGATIVE 12/03/2018 1208   LEUKOCYTESUR TRACE (A) 12/03/2018 1208    ----------------------------------------------------------------------------------------------------------------     Imaging results:   Ct Angio Chest Pe W Or Wo Contrast  Result Date: 01/12/2019 CLINICAL DATA:  Positive D-dimer. Intermediate probability for pulmonary embolism EXAM: CT ANGIOGRAPHY CHEST WITH CONTRAST TECHNIQUE: Multidetector CT imaging of the chest was performed using the standard protocol during bolus administration of intravenous contrast. Multiplanar CT image reconstructions and MIPs were obtained to evaluate the vascular anatomy. CONTRAST:  83mL OMNIPAQUE IOHEXOL 350 MG/ML SOLN COMPARISON:  12/03/2018 FINDINGS: Cardiovascular: Cardiomegaly without pericardial effusion. Mild atherosclerotic calcification of the aorta. Satisfactory opacification of the pulmonary arteries. No typical acute pulmonary embolism is seen. There is a web in the left lower lobe pulmonary artery and irregular filling defect with under filling of downstream vessels and is segmental left lower lobe branch. These were seen on immediate prior. There is also blunting of anterior basal segment pulmonary arteries in the right lower lobe. Questionable posterior segment right upper lobe eccentric filling defect Mediastinum/Nodes: Negative for adenopathy or mass Lungs/Pleura: Small left pleural effusion with adjacent pulmonary opacification and volume loss. Airspace disease seen previously has significantly improved with mild generalized reticular appearance. There is mild centrilobular emphysema. Upper Abdomen: Negative  Musculoskeletal: No acute finding.  Thoracic disc degeneration Review of the MIP images confirms the above findings. IMPRESSION: 1. Negative for acute pulmonary embolism. There is evidence of remote pulmonary emboli with web formation. 2. Improved aeration since 12/03/2018. 3. Small left pleural effusion. There is lower lobe atelectasis and/or scarring. Electronically Signed   By: Monte Fantasia M.D.   On: 01/12/2019 07:04   Ir Fluoro Guide Cv Line Right  Result Date: 01/14/2019 INDICATION: End-stage renal disease, occluded access, hyperkalemia EXAM: ULTRASOUND FLUOROSCOPIC RIGHT EJ TEMPORARY DIALYSIS CATHETER [facility] CATHETER) MEDICATIONS: 1% lidocaine local ANESTHESIA/SEDATION: Moderate Sedation Time: None. [name] level of consciousness and vital signs were monitored continuously by radiology nursing throughout the  procedure under my direct supervision. FLUOROSCOPY TIME:  Fluoroscopy Time: 1 minutes 6 seconds (3 mGy). COMPLICATIONS: None immediate. PROCEDURE: Informed written consent was obtained from the patient after a thorough discussion of the procedural risks, benefits and alternatives. All questions were addressed. Maximal Sterile Barrier Technique was utilized including caps, mask, sterile gowns, sterile gloves, sterile drape, hand hygiene and skin antiseptic. A timeout was performed prior to the initiation of the procedure. Under sterile conditions and local anesthesia, initially a tensor made to access the right internal jugular vein however this was unsuccessful hepatic and guidewire centrally. There is an adjacent dilated right external jugular vein. In a similar fashion, ultrasound micropuncture access performed of the right external jugular vein. Guidewire advanced centrally into the IVC. Micro dilator advanced. Measurements obtained for the appropriate length. Over the Amplatz guidewire, dilatation performed to insert a 20 cm temporary dialysis catheter. Tip position in the proximal  right atrium. Access ready for use. Blood aspirated easily followed by saline and heparin flushes. External caps applied. Catheter secured with Ethilon sutures. Sterile dressing placed over the site. Patient tolerated the procedure well. No immediate complication. IMPRESSION: Successful ultrasound right external jugular 20 cm temporary dialysis catheter as above. Ready for use. Electronically Signed   By: Jerilynn Mages.  Shick M.D.   On: 01/14/2019 15:22   Ir US Guide Vasc Access Right  Result Date: 01/14/2019 INDICATION: End-stage renal disease, occluded access, hyperkalemia EXAM: ULTRASOUND FLUOROSCOPIC RIGHT EJ TEMPORARY DIALYSIS CATHETER [facility] CATHETER) MEDICATIONS: 1% lidocaine local ANESTHESIA/SEDATION: Moderate Sedation Time: None. [name] level of consciousness and vital signs were monitored continuously by radiology nursing throughout the procedure under my direct supervision. FLUOROSCOPY TIME:  Fluoroscopy Time: 1 minutes 6 seconds (3 mGy). COMPLICATIONS: None immediate. PROCEDURE: Informed written consent was obtained from the patient after a thorough discussion of the procedural risks, benefits and alternatives. All questions were addressed. Maximal Sterile Barrier Technique was utilized including caps, mask, sterile gowns, sterile gloves, sterile drape, hand hygiene and skin antiseptic. A timeout was performed prior to the initiation of the procedure. Under sterile conditions and local anesthesia, initially a tensor made to access the right internal jugular vein however this was unsuccessful hepatic and guidewire centrally. There is an adjacent dilated right external jugular vein. In a similar fashion, ultrasound micropuncture access performed of the right external jugular vein. Guidewire advanced centrally into the IVC. Micro dilator advanced. Measurements obtained for the appropriate length. Over the Amplatz guidewire, dilatation performed to insert a 20 cm temporary dialysis catheter. Tip position  in the proximal right atrium. Access ready for use. Blood aspirated easily followed by saline and heparin flushes. External caps applied. Catheter secured with Ethilon sutures. Sterile dressing placed over the site. Patient tolerated the procedure well. No immediate complication. IMPRESSION: Successful ultrasound right external jugular 20 cm temporary dialysis catheter as above. Ready for use. Electronically Signed   By: Jerilynn Mages.  Shick M.D.   On: 01/14/2019 15:22   Dg Chest Portable 1 View  Result Date: 02/03/2019 CLINICAL DATA:  Shortness of breath. EXAM: PORTABLE CHEST 1 VIEW COMPARISON:  Radiograph of Jan 19, 2019. FINDINGS: Stable cardiomegaly. Right internal jugular dialysis catheter is unchanged in position. No pneumothorax or pleural effusion is noted. Stable bilateral reticular densities are noted concerning for edema. Minimal pleural effusions may be present. Bony thorax is unremarkable. IMPRESSION: Stable bilateral reticular densities are noted concerning for pulmonary edema. Minimal pleural effusions. Electronically Signed   By: Marijo Conception M.D.   On: 02/03/2019 13:52  Dg Chest Port 1 View  Result Date: 01/19/2019 CLINICAL DATA:  Catheter placement. EXAM: PORTABLE CHEST 1 VIEW COMPARISON:  Radiograph of Jan 12, 2019. FINDINGS: Stable cardiomegaly. Atherosclerosis of thoracic aorta is noted. Stable interstitial densities are noted throughout both lungs concerning for edema. No pneumothorax or pleural effusion is noted. Minimal right basilar subsegmental atelectasis is noted. Interval placement of right internal jugular dialysis catheter with distal tip in right atrium. Bony thorax is unremarkable. IMPRESSION: Interval placement of right internal jugular dialysis catheter with distal tip in right atrium. No pneumothorax is noted. Stable cardiomegaly with probable bilateral pulmonary edema. Aortic Atherosclerosis (ICD10-I70.0). Electronically Signed   By: Marijo Conception M.D.   On: 01/19/2019 10:18    Dg Chest Port 1 View  Result Date: 01/12/2019 CLINICAL DATA:  Left chest pain.  Nausea. EXAM: PORTABLE CHEST 1 VIEW COMPARISON:  01/11/2019 and chest CT 01/12/2019 FINDINGS: Lungs are adequately inflated with mild focal opacification over the lateral right base unchanged which may be due to atelectasis or infection. Left base/retrocardiac opacification unchanged to slightly worse likely left effusion/atelectasis as seen on recent CT. Hazy bilateral interstitial prominence suggesting interstitial edema and less likely infection. Stable cardiomegaly. Remainder of the exam is unchanged. IMPRESSION: Cardiomegaly with stable hazy interstitial prominence bilaterally suggesting edema. Stable to slightly worsening small left effusion with associated basilar atelectasis. Mild focal opacification over the lateral right base which may be due to atelectasis or infection. Electronically Signed   By: Marin Olp M.D.   On: 01/12/2019 10:30   Dg Chest Portable 1 View  Result Date: 01/11/2019 CLINICAL DATA:  LEFT chest pain, hypoxia, history chronic kidney disease on dialysis EXAM: PORTABLE CHEST 1 VIEW COMPARISON:  Portable exam 1818 hours compared to 12/28/2018 FINDINGS: Enlargement of cardiac silhouette with pulmonary vascular congestion. Atherosclerotic calcification aorta. Scattered interstitial infiltrates likely pulmonary edema question CHF versus fluid overload. No gross pleural effusion or pneumothorax. Bones demineralized. IMPRESSION: Enlargement of cardiac silhouette with pulmonary vascular congestion and probable pulmonary edema. Electronically Signed   By: Lavonia Dana M.D.   On: 01/11/2019 18:54   Dg Fluoro Guide Cv Line-no Report  Result Date: 01/19/2019 Fluoroscopy was utilized by the requesting physician.  No radiographic interpretation.   Vas [place] Lower Extremity Venous (dvt)  Result Date: 01/12/2019  Lower Venous Study Indications: Positive d dimer.  Limitations: Patient movement. Performing  Technologist: [name] RVS  Examination Guidelines: A complete evaluation includes B-mode imaging, spectral Doppler, color Doppler, and power Doppler as needed of all accessible portions of each vessel. Bilateral testing is considered an integral part of a complete examination. Limited examinations for reoccurring indications may be performed as noted.  +---------+---------------+---------+-----------+----------+--------------+ RIGHT    CompressibilityPhasicitySpontaneityPropertiesSummary        +---------+---------------+---------+-----------+----------+--------------+ CFV      Full           Yes      Yes                                 +---------+---------------+---------+-----------+----------+--------------+ SFJ      Full                                                        +---------+---------------+---------+-----------+----------+--------------+ FV Prox  Full                                                        +---------+---------------+---------+-----------+----------+--------------+  FV Mid   Full                                                        +---------+---------------+---------+-----------+----------+--------------+ FV DistalFull                                                        +---------+---------------+---------+-----------+----------+--------------+ PFV      Full                                                        +---------+---------------+---------+-----------+----------+--------------+ POP      Full           Yes      Yes                                 +---------+---------------+---------+-----------+----------+--------------+ PTV                                                   Not visualized +---------+---------------+---------+-----------+----------+--------------+ PERO                                                  Not visualized  +---------+---------------+---------+-----------+----------+--------------+   +---------+---------------+---------+-----------+----------+--------------+ LEFT     CompressibilityPhasicitySpontaneityPropertiesSummary        +---------+---------------+---------+-----------+----------+--------------+ CFV      Full           Yes      Yes                                 +---------+---------------+---------+-----------+----------+--------------+ SFJ      Full                                                        +---------+---------------+---------+-----------+----------+--------------+ FV Prox  Full                                                        +---------+---------------+---------+-----------+----------+--------------+ FV Mid   Full                                                        +---------+---------------+---------+-----------+----------+--------------+  FV DistalFull                                                        +---------+---------------+---------+-----------+----------+--------------+ PFV      Full                                                        +---------+---------------+---------+-----------+----------+--------------+ POP      Full           Yes      Yes                                 +---------+---------------+---------+-----------+----------+--------------+ PTV      Full                                                        +---------+---------------+---------+-----------+----------+--------------+ PERO                                                  Not visualized +---------+---------------+---------+-----------+----------+--------------+     Summary: Right: There is no evidence of deep vein thrombosis in the lower extremity. However, portions of this examination were limited- see technologist comments above. No cystic structure found in the popliteal fossa. Left: There is no evidence of deep vein thrombosis  in the lower extremity. However, portions of this examination were limited- see technologist comments above. No cystic structure found in the popliteal fossa.  *See table(s) above for measurements and observations. Electronically signed by Harold Barban MD on 01/12/2019 at 11:09:18 AM.    Final     My personal review of EKG: Sinus tach at 99 bpm with no acute changes, LVH  Assessment & Plan  Sepsis , no obvious source at this time?  Bilateral pneumonia Continue with vancomycin and cefepime Follow cultures COVID-19 negative  End-stage renal disease with history of noncompliance Dr. Marval Regal on consult Patient on her way for hemodialysis  Pulmonary edema Probably from fluid overload due to renal failure  Hypertension, uncontrolled PRN hydralazine IV Continue with Norvasc   Insulin-dependent diabetes mellitus type 2 Continue with Lantus, ISS  Acute on chronic combined systolic/diastolic congestive heart failure, systolic with fluid overload Last echo on 01/2019 with ejection fraction of 94-85% with diastolic component as well  History of substance abuse Ativan as needed Counseled    DVT Prophylaxis Heparin  AM Labs Ordered, also please review Full Orders  Code Status full  Disposition Plan: Home  Time spent in minutes : 46 minutes  Condition GUARDED   @SIGNATURE @

## 2019-02-03 NOTE — ED Notes (Signed)
Report called to Janett Billow, RN 6E

## 2019-02-03 NOTE — ED Triage Notes (Signed)
Pt BIB [facility] from dialysis. Pt complaining of abdominal pain, SOB, fever, and cough that started last night. Dialysis center reports temp at 99. VSS with EMS. Pt did not receive dialysis treatment today.

## 2019-02-03 NOTE — Progress Notes (Signed)
Pharmacy Antibiotic Note  Rhonda Lynch is a 63 y.o. female admitted on 02/03/2019 with concerns for pneumonia.  Pharmacy has been consulted for vancomycin/cefepime dosing. Tmax 99.9, CBC 16, LA 2.0. Patient is on dialysis (TTS), did not receive dialysis today.   Plan: Vancomycin 1250 mg IV x1 then vancomycin 500 mg IV qHD session Cefepime 2g IV qHD Monitor clinical status, cultures, dialysis schedule, and length of therapy Monitor vancomycin levels as indicated  Height: 5\' 1"  (154.9 cm) Weight: 125 lb 10.6 oz (57 kg) IBW/kg (Calculated) : 47.8  Temp (24hrs), Avg:99.9 F (37.7 C), Min:99.9 F (37.7 C), Max:99.9 F (37.7 C)  Recent Labs  Lab 02/03/19 1400  WBC 16.0*  LATICACIDVEN 2.0*    Estimated Creatinine Clearance: 6.6 mL/min (A) (by C-G formula based on SCr of 6.46 mg/dL (H)).    Allergies  Allergen Reactions  . Acetaminophen Nausea And Vomiting    Patient states Acetaminophen and Acetaminophen containing products make her nauseated. She demonstrated this 06/21/18 with nausea followed by emesis.  Marland Kitchen Hydroxyzine Itching    Antimicrobials this admission: Vancomycin 5/26 >> Cefepime 5/26 >>  Dose adjustments this admission:   Microbiology results: 5/26 Bcx: 5/26 COVID:  Thank you for allowing pharmacy to be a part of this patient's care.  Claiborne Billings, PharmD PGY2 Cardiology Pharmacy Resident Please check AMION for all Pharmacist numbers by unit 02/03/2019 2:54 PM

## 2019-02-03 NOTE — Progress Notes (Addendum)
Patient w/ temp 101.8 orally, HR 120s, RR 30s, BP 210/95. Oxygen saturations 96% on 4L. Lungs w/ rales throughout. Patient w/ rigors; lethargic but arouses to voice. Rapid response RN to bedside. Tylenol suppository given to patient. Notified Dr. Laren Everts via phone of patient status at this time. New orders received via Epic. Patient awaiting hemodialysis.

## 2019-02-03 NOTE — Plan of Care (Signed)

## 2019-02-03 NOTE — ED Provider Notes (Addendum)
[facility] EMERGENCY DEPARTMENT Provider Note   CSN: [id number] Arrival date & time: 02/03/19  1307    History   Chief Complaint Chief Complaint  Patient presents with   Abdominal Pain   Cough   Shortness of Breath   Nausea    HPI Rhonda Lynch is a 63 y.o. female.     HPI  63 yo F here with fever, chills, nausea, SOB. Pt states over the past 24 hours, she has developed a productive cough, body aches, fever, and chills. She's also had nausea, one or two NBNB emesis with no diarrhea. No persistent abdominal pain. She's since developed sharp, left chest wall pain that is worse after coughing, with associated SOB. No known sick contacts but has been going ot HD. She went ot dialysis today for her tx and was sent here due to Temp 100.3 with reported symptoms. No headache. No specific alleviating factors. Cough is worse lying flat.  Past Medical History:  Diagnosis Date   Diabetes mellitus without complication ([place])    ESRD (end stage renal disease) on dialysis ([place])    "TTS; [place]; they're moving me to another one" (09/22/2018)   Hepatitis C    diagnosed 09/2018   Hypertension    Oxygen deficiency    2 L at night   Stroke [facility])    Substance abuse ([place])    has been clean for 4 months     Patient Active Problem List   Diagnosis Date Noted   Acute metabolic encephalopathy    Acute on chronic systolic CHF (congestive heart failure) ([place]) 01/11/2019   Fluid overload 01/11/2019   Diarrhea 97/98/9211   Metabolic acidosis, increased anion gap (IAG) 12/19/2018   Volvulus ([place]) 12/19/2018   Hyperkalemia 11/25/2018   Cocaine overdose, accidental or unintentional, initial encounter ([place]) 11/25/2018   CAP (community acquired pneumonia) 11/17/2018   End-stage renal disease on hemodialysis ([place])    Hypoglycemia 10/23/2018   Non-intractable vomiting    Other ascites    Palliative care encounter    Left arm pain    Chronic  hepatitis C virus genotype 1b infection ([place]) 09/28/2018   ICH (intracerebral hemorrhage) ([place]) small left caudate due to HTN 07/17/2018   HCAP (healthcare-associated pneumonia) 06/21/2018   Substance induced mood disorder ([place]) 05/15/2018   Agitation    Hemodialysis catheter infection, initial encounter ([place])    Fungemia 04/27/2018   Pulmonary embolism ([place]) 04/27/2018   Sepsis ([place]) 04/26/2018   Dyspnea 04/26/2018   Chest pain 04/26/2018   Tachycardia 04/26/2018   Abdominal pain 04/18/2018   Insulin dependent diabetes mellitus ([place]) 04/18/2018   Right flank pain 04/18/2018   Acute respiratory failure with hypoxia ([place]) 04/12/2018   SVT (supraventricular tachycardia) (HCC)    Chronic renal failure    Hypertension    Noncompliance    Acute pulmonary edema (HCC)    Elevated troponin 03/19/2018   Fever    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CC)    Diffuse pulmonary alveolar hemorrhage    Hypoxemia    Pulmonary infiltrate    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[place])    ESRD needing dialysis ([place]) 02/12/2018   Acute respiratory failure ([place]) 02/12/2018   Alcohol abuse 02/12/2018   Microcytic anemia 02/12/2018   Cocaine abuse ([place]) 02/12/2018   Epigastric pain 02/12/2018   Pneumonia 02/12/2018   TOBACCO ABUSE 03/08/2010   PNEUMONIA 03/08/2010    Past Surgical History:  Procedure Laterality Date   AV FISTULA PLACEMENT Left 03/05/2018  Procedure: ARTERIOVENOUS (AV) FISTULA CREATION  LEFT UPPER EXTREMITY;  Surgeon: [name], MD;  Location: [place];  Service: Vascular;  Laterality: Left;   AV FISTULA PLACEMENT Left 07/21/2018   Procedure: ARTERIOVENOUS (AV) FISTULA CREATION;  Surgeon: Marty Heck, MD;  Location: [place];  Service: Vascular;  Laterality: Left;   AV FISTULA PLACEMENT Left 10/27/2018   Procedure: INSERTION OF ARTERIOVENOUS (AV) GORE-TEX GRAFT UPPER ARM;  Surgeon: Marty Heck, MD;  Location: [place];   Service: Vascular;  Laterality: Left;   AV FISTULA PLACEMENT Right 01/19/2019   Procedure: INSERTION OF ARTERIOVENOUS GRAFT RIGHT ARM;  Surgeon: [name], MD;  Location: [place];  Service: Vascular;  Laterality: Right;   [place] Left 09/29/2018   Procedure: BASILIC VEIN TRANSPOSITION SECOND STAGE LEFT UPPER ARM;  Surgeon: Marty Heck, MD;  Location: [place];  Service: Vascular;  Laterality: Left;   HEMATOMA EVACUATION Left 03/06/2018   Procedure: EVACUATION HEMATOMA LEFT ARM;  Surgeon: Angelia Mould, MD;  Location: [place];  Service: Vascular;  Laterality: Left;   I&D EXTREMITY Left 10/06/2018   Procedure: IRRIGATION AND DEBRIDEMENT ARM;  Surgeon: Marty Heck, MD;  Location: [place];  Service: Vascular;  Laterality: Left;   INSERTION OF DIALYSIS CATHETER Left 04/29/2018   Procedure: INSERTION OF DIALYSIS CATHETER LEFT INTERNAL JUGULAR;  Surgeon: Angelia Mould, MD;  Location: [place];  Service: Vascular;  Laterality: Left;   INSERTION OF DIALYSIS CATHETER Right 01/19/2019   Procedure: INSERTION OF HEMO DIALYSIS  PALINDROME TUNNELED CATHETER;  Surgeon: [name], MD;  Location: [place];  Service: Vascular;  Laterality: Right;   IR FLUORO GUIDE CV LINE RIGHT  02/18/2018   IR FLUORO GUIDE CV LINE RIGHT  02/26/2018   IR FLUORO GUIDE CV LINE RIGHT  01/14/2019   IR REMOVAL TUN CV CATH W/O FL  04/27/2018   IR US GUIDE VASC ACCESS RIGHT  02/18/2018   IR US GUIDE VASC ACCESS RIGHT  01/14/2019   TEE WITHOUT CARDIOVERSION N/A 04/29/2018   Procedure: TRANSESOPHAGEAL ECHOCARDIOGRAM (TEE);  Surgeon: Sueanne Margarita, MD;  Location: [place];  Service: Cardiovascular;  Laterality: N/A;   THROMBECTOMY AND REVISION OF ARTERIOVENTOUS (AV) GORETEX  GRAFT Left 12/11/2018   Procedure: THROMBECTOMY AND  REVISION  ARTERIOVENTOUS GORETEX  GRAFT LEFT ARM;  Surgeon: [name], MD;  Location: [facility];  Service: Vascular;  Laterality: Left;   VENOGRAM Left 10/27/2018     Procedure: LEFT CENTRAL VENOGRAM;  Surgeon: Marty Heck, MD;  Location: [place];  Service: Vascular;  Laterality: Left;     OB History   No obstetric history on file.      Home Medications    Prior to Admission medications   Medication Sig Start Date End Date Taking? Authorizing Provider  aspirin EC 81 MG EC tablet Take 1 tablet (81 mg total) by mouth daily. 01/20/19  Yes Pokhrel, [place], MD  B Complex-C-Zn-Folic Acid (DIALYVITE 785-YIFO 15) 0.8 MG TABS Take 1 tablet by mouth daily. 11/04/18  Yes [provider]  calcitRIOL (ROCALTROL) 0.25 MCG capsule Take 1 capsule (0.25 mcg total) by mouth every Monday, Wednesday, and Friday with hemodialysis. 10/29/18  Yes Isabelle Course, MD  diphenhydrAMINE (BENADRYL) 2 % cream Apply 1 application topically 3 (three) times daily as needed for itching.   Yes [provider]  fluticasone (FLONASE) 50 MCG/ACT nasal spray Place 2 sprays into both nostrils daily.   Yes [provider]  gabapentin (NEURONTIN) 100 MG capsule Take 1 capsule (100 mg total) by mouth 3 (three) times daily for 30 days. 01/20/19 02/19/19 Yes Pokhrel, Laxman, MD  hydrocortisone cream 1 % Apply 1 application topically 2 (two) times daily. Patient taking differently: Apply 1 application topically 2 (two) times daily as needed for itching.  01/20/19  Yes Pokhrel, Laxman, MD  insulin aspart (NOVOLOG) 100 UNIT/ML injection Inject 5 Units into the skin 3 (three) times daily with meals. Patient taking differently: Inject 0-5 Units into the skin 3 (three) times daily with meals.  10/13/18  Yes Alphonzo Grieve, MD  insulin glargine (LANTUS) 100 UNIT/ML injection Inject 0.03 mLs (3 Units total) into the skin at bedtime. Patient taking differently: Inject 5 Units into the skin at bedtime.  10/29/18  Yes Isabelle Course, MD  lidocaine-prilocaine (EMLA) cream Apply 1 application topically See admin instructions. Apply small amount to access site (AVF)before dialysis.  Cover with occlusive dressing (Saran wrap) 12/29/18  Yes [provider]  oxyCODONE (OXY IR/ROXICODONE) 5 MG immediate release tablet Take 1 tablet (5 mg total) by mouth every 6 (six) hours as needed for moderate pain or severe pain. 01/20/19  Yes Pokhrel, Laxman, MD  sevelamer carbonate (RENVELA) 800 MG tablet Take 2 tablets (1,600 mg total) by mouth 3 (three) times daily with meals. 11/26/18  Yes Dana Allan I, MD  amLODipine (NORVASC) 5 MG tablet Take 1 tablet (5 mg total) by mouth at bedtime. Patient not taking: Reported on 02/03/2019 10/13/18   Alphonzo Grieve, MD  Blood Glucose Monitoring Suppl (ACCU-CHEK AVIVA) device Use as instructed 10/29/18 10/29/19  Isabelle Course, MD  glucose blood (ACCU-CHEK AVIVA) test strip Use twice per day. 10/29/18   Isabelle Course, MD  Insulin Syringe-Needle U-100 (INSULIN SYRINGE .3CC/31GX5/16") 31G X 5/16" 0.3 ML MISC Inject 1 each into the skin 4 (four) times daily. 06/27/18   Clent Demark, PA-C  Lancets (ACCU-CHEK SOFT [facility]) lancets Use twice per day. 10/29/18   Isabelle Course, MD  nicotine (NICODERM CQ - DOSED IN MG/24 HOURS) 21 mg/24hr patch Place 1 patch (21 mg total) onto the skin daily. Patient not taking: Reported on 02/03/2019 01/20/19   Pokhrel, Corrie Mckusick, MD  pantoprazole (PROTONIX) 40 MG tablet Take 1 tablet (40 mg total) by mouth daily. Patient not taking: Reported on 02/03/2019 11/21/18   Jonetta Osgood, MD  polyethylene glycol (MIRALAX / GLYCOLAX) 17 g packet Take 17 g by mouth daily. Patient not taking: Reported on 02/03/2019 12/29/18   Thurnell Lose, MD    Family History Family History  Problem Relation Age of Onset   Autoimmune disease Neg Hx     Social History Social History   Tobacco Use   Smoking status: Current Every Day Smoker    Packs/day: 0.50    Years: 48.00    Pack years: 24.00    Types: Cigarettes   Smokeless tobacco: Never Used   Tobacco comment: trying to quit ; smoking 1 1/2 cigarettes/day   (09/22/2018)  Substance Use Topics   Alcohol use: Not Currently    Comment: 09/22/2018 "stopped about 6 months ago"   Drug use: Yes    Types: Cocaine, Heroin    Comment: See notes     Allergies   Acetaminophen and Hydroxyzine   Review of Systems Review of Systems  Constitutional: Positive for chills, fatigue and fever.  HENT: Negative for congestion and rhinorrhea.   Eyes: Negative for visual disturbance.  Respiratory: Positive for cough, shortness of  breath and wheezing.   Cardiovascular: Positive for leg swelling. Negative for chest pain.  Gastrointestinal: Positive for nausea. Negative for abdominal pain, diarrhea and vomiting.  Genitourinary: Negative for dysuria and flank pain.  Musculoskeletal: Negative for neck pain and neck stiffness.  Skin: Negative for rash and wound.  Allergic/Immunologic: Negative for immunocompromised state.  Neurological: Positive for weakness. Negative for syncope and headaches.  All other systems reviewed and are negative.    Physical Exam Updated Vital Signs BP (!) 154/85 (BP Location: Left Arm)    Pulse (!) 120    Temp (!) 100.7 F (38.2 C) (Oral)    Resp (!) 29    Ht 5\' 1"  (1.549 m)    Wt 57 kg    SpO2 100%    BMI 23.74 kg/m   Physical Exam Vitals signs and nursing note reviewed.  Constitutional:      General: She is not in acute distress.    Appearance: She is well-developed. She is ill-appearing.  HENT:     Head: Normocephalic and atraumatic.  Eyes:     Conjunctiva/sclera: Conjunctivae normal.  Neck:     Musculoskeletal: Neck supple.     Comments: +JVD Cardiovascular:     Rate and Rhythm: Regular rhythm. Tachycardia present.     Heart sounds: Normal heart sounds. No murmur. No friction rub.  Pulmonary:     Effort: Tachypnea and respiratory distress present.     Breath sounds: Examination of the right-lower field reveals rales. Examination of the left-lower field reveals rales. Decreased breath sounds and rales present. No  wheezing.  Chest:       Comments: TTP left lower chest wall, no bruising or deformity. No skin lesions. Abdominal:     General: There is no distension.     Palpations: Abdomen is soft.     Tenderness: There is no abdominal tenderness.  Skin:    General: Skin is warm.     Capillary Refill: Capillary refill takes less than 2 seconds.  Neurological:     Mental Status: She is alert and oriented to person, place, and time.     Motor: No abnormal muscle tone.      ED Treatments / Results  Labs (all labs ordered are listed, but only abnormal results are displayed) Labs Reviewed  CBC WITH DIFFERENTIAL/PLATELET - Abnormal; Notable for the following components:      Result Value   WBC 16.0 (*)    RBC 3.59 (*)    Hemoglobin 8.1 (*)    HCT 26.6 (*)    MCV 74.1 (*)    MCH 22.6 (*)    RDW 21.0 (*)    Neutro Abs 11.7 (*)    Monocytes Absolute 1.2 (*)    Abs Immature Granulocytes 0.08 (*)    All other components within normal limits  COMPREHENSIVE METABOLIC PANEL - Abnormal; Notable for the following components:   Potassium 6.1 (*)    CO2 20 (*)    BUN 50 (*)    Creatinine, Ser 9.02 (*)    Albumin 2.3 (*)    GFR calc non Af Amer 4 (*)    GFR calc Af Amer 5 (*)    All other components within normal limits  TROPONIN I - Abnormal; Notable for the following components:   Troponin I 0.08 (*)    All other components within normal limits  BRAIN NATRIURETIC PEPTIDE - Abnormal; Notable for the following components:   B Natriuretic Peptide >4,500.0 (*)    All other  components within normal limits  LACTIC ACID, PLASMA - Abnormal; Notable for the following components:   Lactic Acid, Venous 2.0 (*)    All other components within normal limits  GLUCOSE, CAPILLARY - Abnormal; Notable for the following components:   Glucose-Capillary 102 (*)    All other components within normal limits  SARS CORONAVIRUS 2 (HOSPITAL ORDER, [place] [facility])  CULTURE, BLOOD (ROUTINE X 2)    CULTURE, BLOOD (ROUTINE X 2)  MRSA PCR SCREENING  LACTIC ACID, PLASMA  RAPID URINE DRUG SCREEN, HOSP PERFORMED  BASIC METABOLIC PANEL  CBC  CBG MONITORING, ED  CBG MONITORING, ED  CBG MONITORING, ED    EKG EKG Interpretation  Date/Time:  Tuesday Feb 03 2019 13:20:23 EDT Ventricular Rate:  99 PR Interval:    QRS Duration: 78 QT Interval:  334 QTC Calculation: 429 R Axis:   -38 Text Interpretation:  Sinus tachycardia Ventricular tachycardia, unsustained Left ventricular hypertrophy Anterior Q waves, possibly due to LVH No significant change since last tracing Confirmed by Duffy Bruce [phone]) on 02/03/2019 2:48:39 PM Also confirmed by Duffy Bruce [phone]), editor Philomena Doheny [phone])  on 02/03/2019 4:20:03 PM   Radiology Ct Angio Chest Pe W Or Wo Contrast  Result Date: 02/03/2019 CLINICAL DATA:  Dyspnea.  Fever.  Cough.  Abdominal pain. EXAM: CT ANGIOGRAPHY CHEST WITH CONTRAST TECHNIQUE: Multidetector CT imaging of the chest was performed using the standard protocol during bolus administration of intravenous contrast. Multiplanar CT image reconstructions and MIPs were obtained to evaluate the vascular anatomy. CONTRAST:  13mL OMNIPAQUE IOHEXOL 350 MG/ML SOLN COMPARISON:  01/12/2019 chest CT angiogram. Chest radiograph from earlier today. FINDINGS: Cardiovascular: The study is moderate quality for the evaluation of pulmonary embolism, some motion degradation. There are no convincing filling defects in the central, lobar, segmental or subsegmental pulmonary artery branches to suggest acute pulmonary embolism. Atherosclerotic nonaneurysmal thoracic aorta. Top-normal caliber main pulmonary artery (3.2 cm diameter), stable. Stable moderate cardiomegaly. No significant pericardial fluid/thickening. Right internal jugular central venous catheter terminates in the right atrium. Mediastinum/Nodes: No discrete thyroid nodules. Unremarkable esophagus. No pathologically enlarged axillary,  mediastinal or hilar lymph nodes. Lungs/Pleura: No pneumothorax. Trace dependent left pleural effusion. No right pleural effusion. Extensive patchy peribronchovascular consolidation and ground-glass opacity throughout both lungs, most prominent in the upper lobes, significantly worsened from prior chest CT. Relative sparing of peripheral and basilar lungs. No discrete lung masses. Upper abdomen: Contrast reflux into the IVC and hepatic veins. Cholecystectomy. Musculoskeletal: No aggressive appearing focal osseous lesions. Mild thoracic spondylosis. Review of the MIP images confirms the above findings. IMPRESSION: 1. No evidence of pulmonary embolism. 2. Moderate cardiomegaly. Extensive patchy central consolidation and ground-glass opacity symmetrically involving both lungs, significantly worsened from 01/12/2019 chest CT. Trace dependent left pleural effusion. Contrast reflux into the IVC and hepatic veins. Findings favor congestive heart failure with severe acute cardiogenic pulmonary edema. Multilobar pneumonia or ARDS cannot be excluded. Aortic Atherosclerosis (ICD10-I70.0). Electronically Signed   By: Ilona Sorrel M.D.   On: 02/03/2019 16:45   Dg Chest Portable 1 View  Result Date: 02/03/2019 CLINICAL DATA:  Shortness of breath. EXAM: PORTABLE CHEST 1 VIEW COMPARISON:  Radiograph of Jan 19, 2019. FINDINGS: Stable cardiomegaly. Right internal jugular dialysis catheter is unchanged in position. No pneumothorax or pleural effusion is noted. Stable bilateral reticular densities are noted concerning for edema. Minimal pleural effusions may be present. Bony thorax is unremarkable. IMPRESSION: Stable bilateral reticular densities are noted concerning for pulmonary edema. Minimal pleural effusions.  Electronically Signed   By: Marijo Conception M.D.   On: 02/03/2019 13:52    Procedures .Critical Care Performed by: Duffy Bruce, MD Authorized by: Duffy Bruce, MD   Critical care provider statement:     Critical care time (minutes):  35   Critical care time was exclusive of:  Separately billable procedures and treating other patients and teaching time   Critical care was necessary to treat or prevent imminent or life-threatening deterioration of the following conditions:  Circulatory failure, cardiac failure, sepsis, respiratory failure and renal failure   Critical care was time spent personally by me on the following activities:  Development of treatment plan with patient or surrogate, discussions with consultants, evaluation of patient's response to treatment, examination of patient, obtaining history from patient or surrogate, ordering and performing treatments and interventions, ordering and review of laboratory studies, ordering and review of radiographic studies, pulse oximetry, re-evaluation of patient's condition and review of old charts   I assumed direction of critical care for this patient from another provider in my specialty: no   Ultrasound ED Peripheral IV (Provider) Date/Time: 02/03/2019 8:05 PM Performed by: Duffy Bruce, MD Authorized by: Duffy Bruce, MD   Procedure details:    Indications: multiple failed IV attempts     Skin Prep: chlorhexidine gluconate     Location:  Left anterior forearm   Angiocath:  20 G   Bedside Ultrasound Guided: Yes     Images: archived     Patient tolerated procedure without complications: Yes     Dressing applied: Yes     (including critical care time)  Medications Ordered in ED Medications  vancomycin (VANCOCIN) 1,250 mg in sodium chloride 0.9 % 250 mL IVPB (has no administration in time range)  Chlorhexidine Gluconate Cloth 2 % PADS 6 each (has no administration in time range)  amLODipine (NORVASC) tablet 5 mg (has no administration in time range)  pantoprazole (PROTONIX) EC tablet 40 mg (has no administration in time range)  polyethylene glycol (MIRALAX / GLYCOLAX) packet 17 g (has no administration in time range)  aspirin EC  tablet 81 mg (81 mg Oral Not Given 02/03/19 1825)  oxyCODONE (Oxy IR/ROXICODONE) immediate release tablet 5 mg (5 mg Oral Given 02/03/19 1647)  calcitRIOL (ROCALTROL) capsule 0.25 mcg (has no administration in time range)  insulin glargine (LANTUS) injection 5 Units (has no administration in time range)  sevelamer carbonate (RENVELA) tablet 1,600 mg (has no administration in time range)  fluticasone (FLONASE) 50 MCG/ACT nasal spray 2 spray (has no administration in time range)  heparin injection 5,000 Units (has no administration in time range)  sodium chloride flush (NS) 0.9 % injection 3 mL (has no administration in time range)  sodium chloride flush (NS) 0.9 % injection 3 mL (has no administration in time range)  0.9 %  sodium chloride infusion (has no administration in time range)  ondansetron (ZOFRAN) tablet 4 mg (has no administration in time range)    Or  ondansetron (ZOFRAN) injection 4 mg (has no administration in time range)  acetaminophen (TYLENOL) tablet 650 mg ( Oral See Alternative 02/03/19 1751)    Or  acetaminophen (TYLENOL) suppository 650 mg (650 mg Rectal Given 02/03/19 1751)  insulin aspart (novoLOG) injection 0-9 Units (has no administration in time range)  insulin aspart (novoLOG) injection 0-5 Units (has no administration in time range)  vancomycin variable dose per unstable renal function (pharmacist dosing) (has no administration in time range)  ceFEPIme (MAXIPIME) 2 g in sodium chloride  0.9 % 100 mL IVPB (has no administration in time range)  hydrALAZINE (APRESOLINE) injection 10 mg (10 mg Intravenous Given 02/03/19 1823)  LORazepam (ATIVAN) injection 1 mg (1 mg Intravenous Given 02/03/19 1823)  fentaNYL (SUBLIMAZE) injection 50 mcg (50 mcg Intravenous Given 02/03/19 1420)  ceFEPIme (MAXIPIME) 2 g in sodium chloride 0.9 % 100 mL IVPB (0 g Intravenous Stopped 02/03/19 1724)  insulin aspart (novoLOG) injection 5 Units (5 Units Intravenous Given 02/03/19 1525)  dextrose 50 %  solution 50 mL (50 mLs Intravenous Given 02/03/19 1525)  albuterol (VENTOLIN HFA) 108 (90 Base) MCG/ACT inhaler 8 puff (8 puffs Inhalation Given 02/03/19 1526)  iohexol (OMNIPAQUE) 350 MG/ML injection 100 mL (100 mLs Intravenous Contrast Given 02/03/19 1623)     Initial Impression / Assessment and Plan / ED Course  I have reviewed the triage vital signs and the nursing notes.  Pertinent labs & imaging results that were available during my care of the patient were reviewed by me and considered in my medical decision making (see chart for details).        63 yo F here with fever, SOB, cough.  Suspect viral URI, pneumonia, also consideration of PE given her pleuritic pain.  She has a well-known history of nonadherence with dialysis and also appears fluid overloaded with hyperkalemia on lab work.  Given her leukocytosis and mild lactic elevation, though this could be reactive secondary to pulmonary edema, will start on broad-spectrum antibiotics.  Patient has also been temporized for her hyperkalemia and discussed with Dr. Kathe Mariner who will see the patient for urgent dialysis.  CT angio ordered, to be obtained prior to dialysis. Rapid Coronavirus testing is negative, but this is also a consideration.  Will admit for acute hypoxic resp failure 2/2 pulmonary edema with possible PNA versus COVID or other viral URI, with hyperkalemia 2/2 ESRD nonadherence. Will need urgent HD, ABX, admission and close monitoring.  Final Clinical Impressions(s) / ED Diagnoses   Final diagnoses:  Acute pulmonary edema ([place])  ESRD on dialysis [facility])  Acute upper respiratory infection    ED Discharge Orders    None       Duffy Bruce, MD 02/03/19 Jon Billings, MD 02/03/19 2008

## 2019-02-04 ENCOUNTER — Inpatient Hospital Stay (HOSPITAL_COMMUNITY): Payer: Medicare Other

## 2019-02-04 DIAGNOSIS — J810 Acute pulmonary edema: Secondary | ICD-10-CM

## 2019-02-04 DIAGNOSIS — M7989 Other specified soft tissue disorders: Secondary | ICD-10-CM

## 2019-02-04 DIAGNOSIS — J069 Acute upper respiratory infection, unspecified: Secondary | ICD-10-CM

## 2019-02-04 LAB — BASIC METABOLIC PANEL
Anion gap: 12 (ref 5–15)
BUN: 14 mg/dL (ref 8–23)
CO2: 25 mmol/L (ref 22–32)
Calcium: 8.3 mg/dL — ABNORMAL LOW (ref 8.9–10.3)
Chloride: 97 mmol/L — ABNORMAL LOW (ref 98–111)
Creatinine, Ser: 3.74 mg/dL — ABNORMAL HIGH (ref 0.44–1.00)
GFR calc Af Amer: 14 mL/min — ABNORMAL LOW (ref >60)
GFR calc non Af Amer: 12 mL/min — ABNORMAL LOW (ref >60)
Glucose, Bld: 98 mg/dL (ref 70–99)
Potassium: 4.3 mmol/L (ref 3.5–5.1)
Sodium: 134 mmol/L — ABNORMAL LOW (ref 135–145)

## 2019-02-04 LAB — CBC
HCT: 23.4 % — ABNORMAL LOW (ref 36.0–46.0)
Hemoglobin: 7.4 g/dL — ABNORMAL LOW (ref 12.0–15.0)
MCH: 22.2 pg — ABNORMAL LOW (ref 26.0–34.0)
MCHC: 31.6 g/dL (ref 30.0–36.0)
MCV: 70.3 fL — ABNORMAL LOW (ref 80.0–100.0)
Platelets: 299 10*3/uL (ref 150–400)
RBC: 3.33 MIL/uL — ABNORMAL LOW (ref 3.87–5.11)
RDW: 19.8 % — ABNORMAL HIGH (ref 11.5–15.5)
WBC: 14.3 10*3/uL — ABNORMAL HIGH (ref 4.0–10.5)
nRBC: 0 % (ref 0.0–0.2)

## 2019-02-04 LAB — GLUCOSE, CAPILLARY
Glucose-Capillary: 97 mg/dL (ref 70–99)
Glucose-Capillary: 104 mg/dL — ABNORMAL HIGH (ref 70–99)
Glucose-Capillary: 189 mg/dL — ABNORMAL HIGH (ref 70–99)
Glucose-Capillary: 219 mg/dL — ABNORMAL HIGH (ref 70–99)
Glucose-Capillary: 113 mg/dL — ABNORMAL HIGH (ref 70–99)

## 2019-02-04 LAB — C-REACTIVE PROTEIN: CRP: 19.3 mg/dL — ABNORMAL HIGH (ref <1.0)

## 2019-02-04 LAB — SARS CORONAVIRUS 2 BY RT PCR (HOSPITAL ORDER, PERFORMED IN ~~LOC~~ HOSPITAL LAB): SARS Coronavirus 2: NEGATIVE

## 2019-02-04 LAB — PROCALCITONIN: Procalcitonin: 1.8 ng/mL

## 2019-02-04 MED ORDER — LORAZEPAM 2 MG/ML IJ SOLN: 0.5000 mg | INTRAMUSCULAR | Status: DC | PRN

## 2019-02-04 MED ORDER — HEPARIN SODIUM (PORCINE) 1000 UNIT/ML IJ SOLN
INTRAMUSCULAR | Status: AC
  Administered 2019-02-05: 03:00:00 3400 [IU]
  Filled 2019-02-04: qty 4

## 2019-02-04 MED ORDER — OXYCODONE HCL 5 MG PO TABS
ORAL_TABLET | ORAL | Status: AC
  Filled 2019-02-04: qty 1

## 2019-02-04 MED ORDER — VANCOMYCIN HCL 500 MG IV SOLR
500.0000 mg | INTRAVENOUS | Status: DC
  Filled 2019-02-04: qty 500

## 2019-02-04 MED ORDER — CALCITRIOL 0.25 MCG PO CAPS
ORAL_CAPSULE | ORAL | Status: AC
  Administered 2019-02-04: 0.25 ug via ORAL
  Filled 2019-02-04: qty 1

## 2019-02-04 MED ORDER — SODIUM CHLORIDE 0.9 % IV SOLN
2.0000 g | INTRAVENOUS | Status: DC
  Filled 2019-02-04: qty 2

## 2019-02-04 MED ORDER — LORAZEPAM 2 MG/ML IJ SOLN
INTRAMUSCULAR | Status: AC
  Administered 2019-02-04: 1 mg via INTRAVENOUS
  Filled 2019-02-04: qty 1

## 2019-02-04 NOTE — Progress Notes (Addendum)
PROGRESS NOTE    AMBUR PROVINCE  GBT:[id number] DOB: Oct 10, 1955 DOA: 02/03/2019 PCP: Clent Demark, PA-C  Brief Narrative: Rhonda Lynch  is a 63 y.o. female, with past medical history significant for end-stage renal disease on hemodialysis, noncompliance, history of drug abuse and hypertension presented with left chest wall pain, pleuritic associated with increased shortness of breath, fever chills and productive cough.    She was sent from her hemodialysis center to the emergency room due to fever and shortness of breath  -In the ED patient was febrile to 101.8 CT angiogram of chest showed multilobular process suggestive of fluid overload versus pneumonia.  This is her 9th hospitalization since January . -Completed dialysis overnight, however was back on BiPAP through the night, , relatively somnolent   Assessment & Plan:   1.  Acute hypoxic respiratory failure -Febrile to 101.8 on admission, CT Angio chest with diffuse groundglass changes -COVID PCR negative last night, I repeated this morning -Back on BiPAP off and on this morning -Nephrology plans additional dialysis today -Continue IV vancomycin and cefepime -Follow-up blood cultures -Check procalcitonin and CRP  2.  Hyperkalemia -Unfortunately signs off from HD early regularly, long history of noncompliance -Resolved with urgent HD last night  3.  ESRD on hemodialysis with poor compliance -Nephrology following, status post urgent HD last night and repeat treatment this morning  4.  Anemia of chronic disease -Hemoglobin 7.6, check anemia panel, transfuse if less than 7 -EPO with HD  5.  Type 2 diabetes mellitus -Continue Lantus and sliding scale  6.  History of CVA -Continue aspirin  7.  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 -will ask Pulm to review CT   DVT prophylaxis: Heparin subcutaneous Code Status: Full code Family Communication: No  family at bedside Disposition Plan: Home pending clinical improvement  Consultants:   Renal   Procedures:   Antimicrobials:    Subjective: -Restless anxious, trying to pull BiPAP off -Complains of shortness of breath  Objective: Vitals:   02/04/19 0808 02/04/19 0923 02/04/19 1119 02/04/19 1250  BP: (!) 112/59   119/66  Pulse: (!) 105 100  95  Resp: (!) 30 (!) 24  19  Temp: 97.8 F (36.6 C) 98.9 F (37.2 C)    TempSrc: Axillary Oral    SpO2: 100% 99%  100%  Weight:   55.1 kg   Height:        Intake/Output Summary (Last 24 hours) at 02/04/2019 1406 Last data filed at 02/04/2019 0439 Gross per 24 hour  Intake -  Output 3000 ml  Net -3000 ml   Filed Weights   02/03/19 1714 02/04/19 0030 02/04/19 1119  Weight: 57 kg 59.7 kg 55.1 kg    Examination:  Gen: Frail, chronically ill-appearing female, restless confused seen on BiPAP HEENT: PERRLA, Neck supple, no JVD, IJ HD catheter noted Lungs: Few scattered rhonchi CVS: RRR,No Gallops,Rubs or new Murmurs Abd: soft, Non tender, non distended, BS present Extremities: 2+ edema right upper extremity, right arm AV fistula Skin: no new rashes Psychiatry: Poor judgement and insight   Data Reviewed:   CBC: Recent Labs  Lab 02/03/19 1400 02/04/19 0558  WBC 16.0* 14.3*  NEUTROABS 11.7*  --   HGB 8.1* 7.4*  HCT 26.6* 23.4*  MCV 74.1* 70.3*  PLT 319 710   Basic Metabolic Panel: Recent Labs  Lab 02/03/19 1400 02/04/19 0558  NA 137 134*  K 6.1* 4.3  CL 102 97*  CO2 20* 25  GLUCOSE 94 98  BUN 50* 14  CREATININE 9.02* 3.74*  CALCIUM 8.9 8.3*   GFR: Estimated Creatinine Clearance: 11.3 mL/min (A) (by C-G formula based on SCr of 3.74 mg/dL (H)). Liver Function Tests: Recent Labs  Lab 02/03/19 1400  AST 31  ALT 14  ALKPHOS 119  BILITOT 0.6  PROT 7.4  ALBUMIN 2.3*   No results for input(s): LIPASE, AMYLASE in the last 168 hours. No results for input(s): AMMONIA in the last 168 hours. Coagulation  Profile: No results for input(s): INR, PROTIME in the last 168 hours. Cardiac Enzymes: Recent Labs  Lab 02/03/19 1400  TROPONINI 0.08*   BNP (last 3 results) No results for input(s): PROBNP in the last 8760 hours. HbA1C: No results for input(s): HGBA1C in the last 72 hours. CBG: Recent Labs  Lab 02/03/19 1903 02/03/19 2141 02/04/19 0524 02/04/19 0730 02/04/19 1106  GLUCAP 102* 100* 97 104* 189*   Lipid Profile: No results for input(s): CHOL, HDL, LDLCALC, TRIG, CHOLHDL, LDLDIRECT in the last 72 hours. Thyroid Function Tests: No results for input(s): TSH, T4TOTAL, FREET4, T3FREE, THYROIDAB in the last 72 hours. Anemia Panel: No results for input(s): VITAMINB12, FOLATE, FERRITIN, TIBC, IRON, RETICCTPCT in the last 72 hours. Urine analysis:    Component Value Date/Time   COLORURINE YELLOW 12/03/2018 1208   APPEARANCEUR HAZY (A) 12/03/2018 1208   LABSPEC 1.020 12/03/2018 1208   PHURINE 6.0 12/03/2018 1208   GLUCOSEU 100 (A) 12/03/2018 1208   HGBUR LARGE (A) 12/03/2018 1208   HGBUR trace-intact 03/08/2010 1452   BILIRUBINUR NEGATIVE 12/03/2018 [place] 12/03/2018 1208   PROTEINUR >300 (A) 12/03/2018 1208   UROBILINOGEN 0.2 03/08/2010 1452   NITRITE NEGATIVE 12/03/2018 1208   LEUKOCYTESUR TRACE (A) 12/03/2018 1208   Sepsis Labs: @LABRCNTIP (procalcitonin:4,lacticidven:4)  ) Recent Results (from the past 240 hour(s))  SARS Coronavirus 2 (CEPHEID- Performed in [place] hospital lab), Hosp Order     Status: None   Collection Time: 02/03/19  2:23 PM  Result Value Ref Range Status   SARS Coronavirus 2 NEGATIVE NEGATIVE Final    Comment: (NOTE) If result is NEGATIVE SARS-CoV-2 target nucleic acids are NOT DETECTED. The SARS-CoV-2 RNA is generally detectable in upper and lower  respiratory specimens during the acute phase of infection. The lowest  concentration of SARS-CoV-2 viral copies this assay can detect is 250  copies / mL. A negative result  does not preclude SARS-CoV-2 infection  and should not be used as the sole basis for treatment or other  patient management decisions.  A negative result may occur with  improper specimen collection / handling, submission of specimen other  than nasopharyngeal swab, presence of viral mutation(s) within the  areas targeted by this assay, and inadequate number of viral copies  (<250 copies / mL). A negative result must be combined with clinical  observations, patient history, and epidemiological information. If result is POSITIVE SARS-CoV-2 target nucleic acids are DETECTED. The SARS-CoV-2 RNA is generally detectable in upper and lower  respiratory specimens dur ing the acute phase of infection.  Positive  results are indicative of active infection with SARS-CoV-2.  Clinical  correlation with patient history and other diagnostic information is  necessary to determine patient infection status.  Positive results do  not rule out bacterial infection or co-infection with other viruses. If result is PRESUMPTIVE POSTIVE SARS-CoV-2 nucleic acids MAY BE PRESENT.   A presumptive positive result was obtained on the  submitted specimen  and confirmed on repeat testing.  While 2019 novel coronavirus  (SARS-CoV-2) nucleic acids may be present in the submitted sample  additional confirmatory testing may be necessary for epidemiological  and / or clinical management purposes  to differentiate between  SARS-CoV-2 and other Sarbecovirus currently known to infect humans.  If clinically indicated additional testing with an alternate test  methodology [id number]) is advised. The SARS-CoV-2 RNA is generally  detectable in upper and lower respiratory sp ecimens during the acute  phase of infection. The expected result is Negative. Fact Sheet for Patients:  StrictlyIdeas.no Fact Sheet for Healthcare Providers: BankingDealers.co.za This test is not yet approved or  cleared by the [place] FDA and has been authorized for detection and/or diagnosis of SARS-CoV-2 by FDA under an Emergency Use Authorization (EUA).  This EUA will remain in effect (meaning this test can be used) for the duration of the COVID-19 declaration under Section 564(b)(1) of the Act, 21 U.S.C. section 360bbb-3(b)(1), unless the authorization is terminated or revoked sooner. Performed at [facility], [place] [address] [place], [place] [address]   Blood culture (routine x 2)     Status: None (Preliminary result)   Collection Time: 02/03/19  8:48 PM  Result Value Ref Range Status   Specimen Description BLOOD LEFT FOREARM  Final   Special Requests   Final    BOTTLES DRAWN AEROBIC AND ANAEROBIC Blood Culture adequate volume   Culture   Final    NO GROWTH < 12 HOURS Performed at [facility], [place] [address], [place], [place] [address]    Report Status PENDING  Incomplete  Blood culture (routine x 2)     Status: None (Preliminary result)   Collection Time: 02/03/19  8:53 PM  Result Value Ref Range Status   Specimen Description BLOOD LEFT HAND  Final   Special Requests   Final    BOTTLES DRAWN AEROBIC AND ANAEROBIC Blood Culture adequate volume   Culture   Final    NO GROWTH < 12 HOURS Performed at [facility], [place] [address], [place], [place] [address]    Report Status PENDING  Incomplete  SARS Coronavirus 2 (CEPHEID- Performed in [place] hospital lab), Hosp Order     Status: None   Collection Time: 02/04/19  8:52 AM  Result Value Ref Range Status   SARS Coronavirus 2 NEGATIVE NEGATIVE Final    Comment: (NOTE) If result is NEGATIVE SARS-CoV-2 target nucleic acids are NOT DETECTED. The SARS-CoV-2 RNA is generally detectable in upper and lower  respiratory specimens during the acute phase of infection. The lowest  concentration of SARS-CoV-2 viral copies this assay can detect is 250  copies / mL. A negative result does not preclude SARS-CoV-2  infection  and should not be used as the sole basis for treatment or other  patient management decisions.  A negative result may occur with  improper specimen collection / handling, submission of specimen other  than nasopharyngeal swab, presence of viral mutation(s) within the  areas targeted by this assay, and inadequate number of viral copies  (<250 copies / mL). A negative result must be combined with clinical  observations, patient history, and epidemiological information. If result is POSITIVE SARS-CoV-2 target nucleic acids are DETECTED. The SARS-CoV-2 RNA is generally detectable in upper and lower  respiratory specimens dur ing the acute phase of infection.  Positive  results are indicative of active infection with SARS-CoV-2.  Clinical  correlation with patient  history and other diagnostic information is  necessary to determine patient infection status.  Positive results do  not rule out bacterial infection or co-infection with other viruses. If result is PRESUMPTIVE POSTIVE SARS-CoV-2 nucleic acids MAY BE PRESENT.   A presumptive positive result was obtained on the submitted specimen  and confirmed on repeat testing.  While 2019 novel coronavirus  (SARS-CoV-2) nucleic acids may be present in the submitted sample  additional confirmatory testing may be necessary for epidemiological  and / or clinical management purposes  to differentiate between  SARS-CoV-2 and other Sarbecovirus currently known to infect humans.  If clinically indicated additional testing with an alternate test  methodology [phone]) is advised. The SARS-CoV-2 RNA is generally  detectable in upper and lower respiratory sp ecimens during the acute  phase of infection. The expected result is Negative. Fact Sheet for Patients:  StrictlyIdeas.no Fact Sheet for Healthcare Providers: BankingDealers.co.za This test is not yet approved or cleared by the [place]  FDA and has been authorized for detection and/or diagnosis of SARS-CoV-2 by FDA under an Emergency Use Authorization (EUA).  This EUA will remain in effect (meaning this test can be used) for the duration of the COVID-19 declaration under Section 564(b)(1) of the Act, 21 U.S.C. section 360bbb-3(b)(1), unless the authorization is terminated or revoked sooner. Performed at [facility], [place] [address] [place], [place] [address]          Radiology Studies: Ct Angio Chest Pe W Or Wo Contrast  Result Date: 02/03/2019 CLINICAL DATA:  Dyspnea.  Fever.  Cough.  Abdominal pain. EXAM: CT ANGIOGRAPHY CHEST WITH CONTRAST TECHNIQUE: Multidetector CT imaging of the chest was performed using the standard protocol during bolus administration of intravenous contrast. Multiplanar CT image reconstructions and MIPs were obtained to evaluate the vascular anatomy. CONTRAST:  169mL OMNIPAQUE IOHEXOL 350 MG/ML SOLN COMPARISON:  01/12/2019 chest CT angiogram. Chest radiograph from earlier today. FINDINGS: Cardiovascular: The study is moderate quality for the evaluation of pulmonary embolism, some motion degradation. There are no convincing filling defects in the central, lobar, segmental or subsegmental pulmonary artery branches to suggest acute pulmonary embolism. Atherosclerotic nonaneurysmal thoracic aorta. Top-normal caliber main pulmonary artery (3.2 cm diameter), stable. Stable moderate cardiomegaly. No significant pericardial fluid/thickening. Right internal jugular central venous catheter terminates in the right atrium. Mediastinum/Nodes: No discrete thyroid nodules. Unremarkable esophagus. No pathologically enlarged axillary, mediastinal or hilar lymph nodes. Lungs/Pleura: No pneumothorax. Trace dependent left pleural effusion. No right pleural effusion. Extensive patchy peribronchovascular consolidation and ground-glass opacity throughout both lungs, most prominent in the upper lobes, significantly  worsened from prior chest CT. Relative sparing of peripheral and basilar lungs. No discrete lung masses. Upper abdomen: Contrast reflux into the IVC and hepatic veins. Cholecystectomy. Musculoskeletal: No aggressive appearing focal osseous lesions. Mild thoracic spondylosis. Review of the MIP images confirms the above findings. IMPRESSION: 1. No evidence of pulmonary embolism. 2. Moderate cardiomegaly. Extensive patchy central consolidation and ground-glass opacity symmetrically involving both lungs, significantly worsened from 01/12/2019 chest CT. Trace dependent left pleural effusion. Contrast reflux into the IVC and hepatic veins. Findings favor congestive heart failure with severe acute cardiogenic pulmonary edema. Multilobar pneumonia or ARDS cannot be excluded. Aortic Atherosclerosis (ICD10-I70.0). Electronically Signed   By: Ilona Sorrel M.D.   On: 02/03/2019 16:45   Dg Chest Portable 1 View  Result Date: 02/03/2019 CLINICAL DATA:  Shortness of breath. EXAM: PORTABLE CHEST 1 VIEW COMPARISON:  Radiograph of Jan 19, 2019. FINDINGS: Stable cardiomegaly. Right internal jugular  dialysis catheter is unchanged in position. No pneumothorax or pleural effusion is noted. Stable bilateral reticular densities are noted concerning for edema. Minimal pleural effusions may be present. Bony thorax is unremarkable. IMPRESSION: Stable bilateral reticular densities are noted concerning for pulmonary edema. Minimal pleural effusions. Electronically Signed   By: Marijo Conception M.D.   On: 02/03/2019 13:52        Scheduled Meds: . amLODipine  5 mg Oral QHS  . aspirin EC  81 mg Oral Daily  . calcitRIOL  0.25 mcg Oral Q M,W,F-HD  . Chlorhexidine Gluconate Cloth  6 each Topical Q0600  . fluticasone  2 spray Each Nare Daily  . heparin  5,000 Units Subcutaneous BID  . insulin aspart  0-5 Units Subcutaneous QHS  . insulin aspart  0-9 Units Subcutaneous TID WC  . insulin glargine  5 Units Subcutaneous QHS  .  pantoprazole  40 mg Oral Daily  . polyethylene glycol  17 g Oral Daily  . sevelamer carbonate  1,600 mg Oral TID WC  . sodium chloride flush  3 mL Intravenous Q12H  . sodium zirconium cyclosilicate  10 g Oral Once   Continuous Infusions: . sodium chloride    . [START ON 02/05/2019] ceFEPime (MAXIPIME) IV    . [START ON 02/05/2019] vancomycin       LOS: 1 day    Time spent: 59min   Domenic Polite, MD [facility]  02/04/2019, 2:06 PM

## 2019-02-04 NOTE — Progress Notes (Addendum)
[facility] Progress Note   Subjective:  Seen in room - was dialyzed overnight with 3L UF. Initially able to come off bipap, then replaced overnight. This morning she was able to transition to nasal cannula, but still using 6L/min O2 and with some dyspnea. No CP currently. Eating breakfast. Still lethargic.  Objective Vitals:   02/04/19 0539 02/04/19 0745 02/04/19 0808 02/04/19 0923  BP:   (!) 112/59   Pulse: (!) 117 (!) 103 (!) 105 100  Resp: (!) 31 (!) 37 (!) 30 (!) 24  Temp:   97.8 F (36.6 C) 98.9 F (37.2 C)  TempSrc:   Axillary Oral  SpO2: 100% 100% 100% 99%  Weight:      Height:       Physical Exam General: Chronically ill appearing woman. + nasal O2 Heart: RRR; no murmur Lungs: Poor inspiratory effort, scattered rhonchi Abdomen: soft, non-tender Extremities: No LE edema Dialysis Access: R TDC + RUE AVF  Additional Objective Labs: Basic Metabolic Panel: Recent Labs  Lab 02/03/19 1400 02/04/19 0558  NA 137 134*  K 6.1* 4.3  CL 102 97*  CO2 20* 25  GLUCOSE 94 98  BUN 50* 14  CREATININE 9.02* 3.74*  CALCIUM 8.9 8.3*   Liver Function Tests: Recent Labs  Lab 02/03/19 1400  AST 31  ALT 14  ALKPHOS 119  BILITOT 0.6  PROT 7.4  ALBUMIN 2.3*   CBC: Recent Labs  Lab 02/03/19 1400 02/04/19 0558  WBC 16.0* 14.3*  NEUTROABS 11.7*  --   HGB 8.1* 7.4*  HCT 26.6* 23.4*  MCV 74.1* 70.3*  PLT 319 299   Blood Culture    Component Value Date/Time   SDES BLOOD LEFT HAND 02/03/2019 2053   SPECREQUEST  02/03/2019 2053    BOTTLES DRAWN AEROBIC AND ANAEROBIC Blood Culture adequate volume   CULT  02/03/2019 2053    NO GROWTH < 12 HOURS Performed at [facility] [address], [place], [place] [address]    REPTSTATUS PENDING 02/03/2019 2053   Cardiac Enzymes: Recent Labs  Lab 02/03/19 1400  TROPONINI 0.08*   CBG: Recent Labs  Lab 02/03/19 1903 02/03/19 2141 02/04/19 0524 02/04/19 0730  GLUCAP 102* 100* 97 104*    Studies/Results: Ct Angio Chest Pe W Or Wo Contrast  Result Date: 02/03/2019 CLINICAL DATA:  Dyspnea.  Fever.  Cough.  Abdominal pain. EXAM: CT ANGIOGRAPHY CHEST WITH CONTRAST TECHNIQUE: Multidetector CT imaging of the chest was performed using the standard protocol during bolus administration of intravenous contrast. Multiplanar CT image reconstructions and MIPs were obtained to evaluate the vascular anatomy. CONTRAST:  120mL OMNIPAQUE IOHEXOL 350 MG/ML SOLN COMPARISON:  01/12/2019 chest CT angiogram. Chest radiograph from earlier today. FINDINGS: Cardiovascular: The study is moderate quality for the evaluation of pulmonary embolism, some motion degradation. There are no convincing filling defects in the central, lobar, segmental or subsegmental pulmonary artery branches to suggest acute pulmonary embolism. Atherosclerotic nonaneurysmal thoracic aorta. Top-normal caliber main pulmonary artery (3.2 cm diameter), stable. Stable moderate cardiomegaly. No significant pericardial fluid/thickening. Right internal jugular central venous catheter terminates in the right atrium. Mediastinum/Nodes: No discrete thyroid nodules. Unremarkable esophagus. No pathologically enlarged axillary, mediastinal or hilar lymph nodes. Lungs/Pleura: No pneumothorax. Trace dependent left pleural effusion. No right pleural effusion. Extensive patchy peribronchovascular consolidation and ground-glass opacity throughout both lungs, most prominent in the upper lobes, significantly worsened from prior chest CT. Relative sparing of peripheral and basilar lungs. No discrete lung masses. Upper abdomen: Contrast reflux into  the IVC and hepatic veins. Cholecystectomy. Musculoskeletal: No aggressive appearing focal osseous lesions. Mild thoracic spondylosis. Review of the MIP images confirms the above findings. IMPRESSION: 1. No evidence of pulmonary embolism. 2. Moderate cardiomegaly. Extensive patchy central consolidation and ground-glass  opacity symmetrically involving both lungs, significantly worsened from 01/12/2019 chest CT. Trace dependent left pleural effusion. Contrast reflux into the IVC and hepatic veins. Findings favor congestive heart failure with severe acute cardiogenic pulmonary edema. Multilobar pneumonia or ARDS cannot be excluded. Aortic Atherosclerosis (ICD10-I70.0). Electronically Signed   By: Ilona Sorrel M.D.   On: 02/03/2019 16:45   Dg Chest Portable 1 View  Result Date: 02/03/2019 CLINICAL DATA:  Shortness of breath. EXAM: PORTABLE CHEST 1 VIEW COMPARISON:  Radiograph of Jan 19, 2019. FINDINGS: Stable cardiomegaly. Right internal jugular dialysis catheter is unchanged in position. No pneumothorax or pleural effusion is noted. Stable bilateral reticular densities are noted concerning for edema. Minimal pleural effusions may be present. Bony thorax is unremarkable. IMPRESSION: Stable bilateral reticular densities are noted concerning for pulmonary edema. Minimal pleural effusions. Electronically Signed   By: Marijo Conception M.D.   On: 02/03/2019 13:52   Medications: . sodium chloride    . [START ON 02/05/2019] ceFEPime (MAXIPIME) IV    . [START ON 02/05/2019] vancomycin     . amLODipine  5 mg Oral QHS  . aspirin EC  81 mg Oral Daily  . calcitRIOL  0.25 mcg Oral Q M,W,F-HD  . Chlorhexidine Gluconate Cloth  6 each Topical Q0600  . fluticasone  2 spray Each Nare Daily  . heparin  5,000 Units Subcutaneous BID  . insulin aspart  0-5 Units Subcutaneous QHS  . insulin aspart  0-9 Units Subcutaneous TID WC  . insulin glargine  5 Units Subcutaneous QHS  . pantoprazole  40 mg Oral Daily  . polyethylene glycol  17 g Oral Daily  . sevelamer carbonate  1,600 mg Oral TID WC  . sodium chloride flush  3 mL Intravenous Q12H  . sodium zirconium cyclosilicate  10 g Oral Once    Dialysis Orders: TTS [facility] 4hr, 400/800, EDW 54kg, 2K/2.5Ca, TDC, heparin 3000 - Hectoral 59mcg Iv q HD - Mircera 200 q 2 weeks (last given  171mcg 5/14) - Venofer 100mg  x 10 ordered (s/p 3 doses so far)  Assessment/Plan: 1.  Pulm edema v. HCAP: Low grade fever on admit - CT angio without PE, + diffuse ground-glass changes. S/p 3L UF overnight with dialysis - will plan on another HD treatment today. On Vanc/Cefepime. COVID negative. 2.  Hyperkalemia: K 6.1 on admit, resolved with dialysis. 3.  ESRD: Usual TTS schedule, s/p HD overnight - will need another treatment today d/t persistent dyspnea. 4.  Hypertension/volume: BP better, follow. 5.  Anemia: Hgb 7.4 - last outpatient Hgb 7.6 on 5/21 - not due for ESA yet. Transfuse if drops < 7. 6.  Metabolic bone disease: Ca ok, Phos pending. Resume home binders. 7.  T2DM 8.  Hx CVA 9.  Hx BOOP 02/2018 - resolved with steroids. + underlying lung issues.   Veneta Penton, PA-C 02/04/2019, 10:31 AM  [facility] Pager: [phone]  Pt seen, examined and agree w A/P as above.  Kelly Splinter  MD 02/04/2019, 3:43 PM

## 2019-02-04 NOTE — Progress Notes (Signed)
Pt placed on Bipap for respiratory distress with respirtaions in the 40's. Bipap ordered PRN for use during dialysis. Pt seems more comfortable.

## 2019-02-04 NOTE — Progress Notes (Signed)
RUE venous duplex       has been completed. Preliminary results can be found under CV proc through chart review. Bibiana Gillean, BS, RDMS, RVT   

## 2019-02-04 NOTE — Progress Notes (Signed)
Pt transported from HD to [facility] without event. RT will monitor.

## 2019-02-05 ENCOUNTER — Inpatient Hospital Stay (HOSPITAL_COMMUNITY): Payer: Medicare Other

## 2019-02-05 ENCOUNTER — Encounter (HOSPITAL_COMMUNITY): Payer: Self-pay | Admitting: Nephrology

## 2019-02-05 LAB — CBC
HCT: 22 % — ABNORMAL LOW (ref 36.0–46.0)
Hemoglobin: 7.1 g/dL — ABNORMAL LOW (ref 12.0–15.0)
MCH: 22.5 pg — ABNORMAL LOW (ref 26.0–34.0)
MCHC: 32.3 g/dL (ref 30.0–36.0)
MCV: 69.6 fL — ABNORMAL LOW (ref 80.0–100.0)
Platelets: 295 10*3/uL (ref 150–400)
RBC: 3.16 MIL/uL — ABNORMAL LOW (ref 3.87–5.11)
RDW: 20.1 % — ABNORMAL HIGH (ref 11.5–15.5)
WBC: 9.2 10*3/uL (ref 4.0–10.5)
nRBC: 0 % (ref 0.0–0.2)

## 2019-02-05 LAB — RETICULOCYTES
Immature Retic Fract: 23.6 % — ABNORMAL HIGH (ref 2.3–15.9)
RBC.: 3.19 MIL/uL — ABNORMAL LOW (ref 3.87–5.11)
Retic Count, Absolute: 87.1 10*3/uL (ref 19.0–186.0)
Retic Ct Pct: 2.7 % (ref 0.4–3.1)

## 2019-02-05 LAB — PROCALCITONIN: Procalcitonin: 2.13 ng/mL

## 2019-02-05 LAB — PREPARE RBC (CROSSMATCH)

## 2019-02-05 LAB — IRON AND TIBC
Iron: 30 ug/dL (ref 28–170)
Saturation Ratios: 15 % (ref 10.4–31.8)
TIBC: 204 ug/dL — ABNORMAL LOW (ref 250–450)
UIBC: 174 ug/dL

## 2019-02-05 LAB — GLUCOSE, CAPILLARY
Glucose-Capillary: 111 mg/dL — ABNORMAL HIGH (ref 70–99)
Glucose-Capillary: 114 mg/dL — ABNORMAL HIGH (ref 70–99)
Glucose-Capillary: 107 mg/dL — ABNORMAL HIGH (ref 70–99)
Glucose-Capillary: 118 mg/dL — ABNORMAL HIGH (ref 70–99)
Glucose-Capillary: 126 mg/dL — ABNORMAL HIGH (ref 70–99)

## 2019-02-05 LAB — BASIC METABOLIC PANEL
Anion gap: 10 (ref 5–15)
BUN: 13 mg/dL (ref 8–23)
CO2: 26 mmol/L (ref 22–32)
Calcium: 8.6 mg/dL — ABNORMAL LOW (ref 8.9–10.3)
Chloride: 97 mmol/L — ABNORMAL LOW (ref 98–111)
Creatinine, Ser: 3.25 mg/dL — ABNORMAL HIGH (ref 0.44–1.00)
GFR calc Af Amer: 16 mL/min — ABNORMAL LOW (ref >60)
GFR calc non Af Amer: 14 mL/min — ABNORMAL LOW (ref >60)
Glucose, Bld: 102 mg/dL — ABNORMAL HIGH (ref 70–99)
Potassium: 4 mmol/L (ref 3.5–5.1)
Sodium: 133 mmol/L — ABNORMAL LOW (ref 135–145)

## 2019-02-05 LAB — FERRITIN: Ferritin: 722 ng/mL — ABNORMAL HIGH (ref 11–307)

## 2019-02-05 LAB — FOLATE: Folate: 22.9 ng/mL (ref >5.9)

## 2019-02-05 LAB — VITAMIN B12: Vitamin B-12: 450 pg/mL (ref 180–914)

## 2019-02-05 MED ORDER — IOHEXOL 300 MG/ML  SOLN
80.0000 mL | Freq: Once | INTRAMUSCULAR | Status: AC | PRN
  Administered 2019-02-05: 75 mL via INTRAVENOUS

## 2019-02-05 MED ORDER — GABAPENTIN 100 MG PO CAPS
100.0000 mg | ORAL_CAPSULE | Freq: Two times a day (BID) | ORAL | Status: DC
  Administered 2019-02-05 – 2019-02-07 (×5): 100 mg via ORAL
  Filled 2019-02-05 (×5): qty 1

## 2019-02-05 MED ORDER — SODIUM CHLORIDE 0.9% IV SOLUTION
Freq: Once | INTRAVENOUS | Status: AC
  Administered 2019-02-05: 15:00:00 via INTRAVENOUS

## 2019-02-05 MED ORDER — HYDROMORPHONE HCL 1 MG/ML IJ SOLN
1.0000 mg | INTRAMUSCULAR | Status: DC | PRN
  Administered 2019-02-05: 1 mg via INTRAVENOUS
  Filled 2019-02-05: qty 1

## 2019-02-05 MED ORDER — DOXERCALCIFEROL 4 MCG/2ML IV SOLN
5.0000 ug | INTRAVENOUS | Status: DC
  Administered 2019-02-07: 5 ug via INTRAVENOUS

## 2019-02-05 MED ORDER — SODIUM CHLORIDE 0.9 % IV SOLN
1.0000 g | INTRAVENOUS | Status: DC
  Administered 2019-02-05 – 2019-02-06 (×2): 1 g via INTRAVENOUS
  Filled 2019-02-05 (×4): qty 1

## 2019-02-05 NOTE — Progress Notes (Signed)
PROGRESS NOTE    RALONDA TARTT  DXA:[id number] DOB: 02/08/1956 DOA: 02/03/2019 PCP: Clent Demark, PA-C  Brief Narrative: Rhonda Lynch  is a 63 y.o. female, with past medical history significant for end-stage renal disease on hemodialysis, noncompliance, history of drug abuse and hypertension presented with left chest wall pain, pleuritic associated with increased shortness of breath, fever chills and productive cough.    She was sent from her hemodialysis center to the emergency room due to fever and shortness of breath  -In the ED patient was febrile to 101.8 CT angiogram of chest showed multilobular process suggestive of fluid overload versus pneumonia.  This is her 9th hospitalization since January . -Completed dialysis overnight, however was back on BiPAP through the night, , relatively somnolent   Assessment & Plan:   1.  Acute hypoxic respiratory failure -Febrile to 101.8 on admission, CT Angio chest with patchy consolidation and diffuse groundglass changes/ground glass changes are chronic from 2019. -Fever, elevated pro calcitonin level and patchy consolidation on chronic ground glass changes points towards bacterial pneumonia, in addition to fluid overload and chronic ground glass changes -COVID PCR negative x2 -clinically improved and weaned off O2  -continue IV cefepime, Vancomycin stopped -Hypoxia also improved with volume removal/HD -She was treated for BOOP with steroids in 02/2018 per Pulm consult inpatient, h/o ANCA positive vasculitis and possibly underlying pneumonitis/ at baseline accounting for chronic underlying groundglass changes, worsened acutely in the setting of infection and fluid -d/w Pulm will need Follow up for this, Dr.Mannam will arrange FU for her and get HRCT when she is improved and at baseline  2.  Abdominal pain  -On exam has severe tenderness in her left upper quadrant, will get CT abd  3. Hyperkalemia -Unfortunately signs off from HD early  regularly, long history of noncompliance -Resolved with urgent HD  3.  ESRD on hemodialysis with poor compliance -Nephrology following, improved following extra HD, back to dry weight  4.  Anemia of chronic disease -Hemoglobin 7.1, FU anemia panel, continue EPO with hemodialysis, will transfuse 1 unit of PRBC  5.  Type 2 diabetes mellitus -Continue Lantus and sliding scale  6.  History of CVA -Continue aspirin  7.  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  DVT prophylaxis: Heparin subcutaneous Code Status: Full code Family Communication: No family at bedside Disposition Plan: Home pending clinical improvement  Consultants:   Renal   Procedures:   Antimicrobials:    Subjective: -breathing better, complains of back pain and left upper quadrant pain Objective: Vitals:   02/05/19 0522 02/05/19 0738 02/05/19 0918 02/05/19 1152  BP: 110/68   117/68  Pulse: 99 99 95 80  Resp: (!) 23  19 18   Temp: 98.2 F (36.8 C)   98.4 F (36.9 C)  TempSrc: Oral   Oral  SpO2: 100%  91% 100%  Weight: 54.5 kg     Height:        Intake/Output Summary (Last 24 hours) at 02/05/2019 1424 Last data filed at 02/05/2019 0910 Gross per 24 hour  Intake 483 ml  Output 2168 ml  Net -1685 ml   Filed Weights   02/04/19 1914 02/04/19 2202 02/05/19 0522  Weight: 58.4 kg 56.5 kg 54.5 kg    Examination:  Gen: Frail chronically ill-appearing female, awake alert oriented x2 HEENT: Right IJ HD catheter Lungs: Diffuse scattered rhonchi  CVS: RRR,No Gallops,Rubs or new Murmurs Abd: Soft, left  upper quadrant tenderness noted, no rigidity, mild rebound Extremities: Right upper extremity edema, right arm AV fistula Skin: no new rashes Psychiatry: Poor judgement and insight   Data Reviewed:   CBC: Recent Labs  Lab 02/03/19 1400 02/04/19 0558 02/05/19 0352  WBC 16.0* 14.3* 9.2  NEUTROABS 11.7*  --   --   HGB 8.1* 7.4*  7.1*  HCT 26.6* 23.4* 22.0*  MCV 74.1* 70.3* 69.6*  PLT 319 299 601   Basic Metabolic Panel: Recent Labs  Lab 02/03/19 1400 02/04/19 0558 02/05/19 0352  NA 137 134* 133*  K 6.1* 4.3 4.0  CL 102 97* 97*  CO2 20* 25 26  GLUCOSE 94 98 102*  BUN 50* 14 13  CREATININE 9.02* 3.74* 3.25*  CALCIUM 8.9 8.3* 8.6*   GFR: Estimated Creatinine Clearance: 13 mL/min (A) (by C-G formula based on SCr of 3.25 mg/dL (H)). Liver Function Tests: Recent Labs  Lab 02/03/19 1400  AST 31  ALT 14  ALKPHOS 119  BILITOT 0.6  PROT 7.4  ALBUMIN 2.3*   No results for input(s): LIPASE, AMYLASE in the last 168 hours. No results for input(s): AMMONIA in the last 168 hours. Coagulation Profile: No results for input(s): INR, PROTIME in the last 168 hours. Cardiac Enzymes: Recent Labs  Lab 02/03/19 1400  TROPONINI 0.08*   BNP (last 3 results) No results for input(s): PROBNP in the last 8760 hours. HbA1C: No results for input(s): HGBA1C in the last 72 hours. CBG: Recent Labs  Lab 02/04/19 1652 02/04/19 2300 02/05/19 0524 02/05/19 0832 02/05/19 1149  GLUCAP 219* 113* 111* 114* 107*   Lipid Profile: No results for input(s): CHOL, HDL, LDLCALC, TRIG, CHOLHDL, LDLDIRECT in the last 72 hours. Thyroid Function Tests: No results for input(s): TSH, T4TOTAL, FREET4, T3FREE, THYROIDAB in the last 72 hours. Anemia Panel: Recent Labs    02/05/19 0806  VITAMINB12 450  FOLATE 22.9  FERRITIN 722*  TIBC 204*  IRON 30  RETICCTPCT 2.7   Urine analysis:    Component Value Date/Time   COLORURINE YELLOW 12/03/2018 1208   APPEARANCEUR HAZY (A) 12/03/2018 1208   LABSPEC 1.020 12/03/2018 1208   PHURINE 6.0 12/03/2018 1208   GLUCOSEU 100 (A) 12/03/2018 1208   HGBUR LARGE (A) 12/03/2018 1208   HGBUR trace-intact 03/08/2010 1452   BILIRUBINUR NEGATIVE 12/03/2018 [place] 12/03/2018 1208   PROTEINUR >300 (A) 12/03/2018 1208   UROBILINOGEN 0.2 03/08/2010 1452   NITRITE NEGATIVE  12/03/2018 1208   LEUKOCYTESUR TRACE (A) 12/03/2018 1208   Sepsis Labs: @LABRCNTIP (procalcitonin:4,lacticidven:4)  ) Recent Results (from the past 240 hour(s))  SARS Coronavirus 2 (CEPHEID- Performed in [place] hospital lab), Hosp Order     Status: None   Collection Time: 02/03/19  2:23 PM  Result Value Ref Range Status   SARS Coronavirus 2 NEGATIVE NEGATIVE Final    Comment: (NOTE) If result is NEGATIVE SARS-CoV-2 target nucleic acids are NOT DETECTED. The SARS-CoV-2 RNA is generally detectable in upper and lower  respiratory specimens during the acute phase of infection. The lowest  concentration of SARS-CoV-2 viral copies this assay can detect is 250  copies / mL. A negative result does not preclude SARS-CoV-2 infection  and should not be used as the sole basis for treatment or other  patient management decisions.  A negative result may occur with  improper specimen collection / handling, submission of specimen other  than nasopharyngeal swab, presence of viral mutation(s) within the  areas targeted by this assay, and  inadequate number of viral copies  (<250 copies / mL). A negative result must be combined with clinical  observations, patient history, and epidemiological information. If result is POSITIVE SARS-CoV-2 target nucleic acids are DETECTED. The SARS-CoV-2 RNA is generally detectable in upper and lower  respiratory specimens dur ing the acute phase of infection.  Positive  results are indicative of active infection with SARS-CoV-2.  Clinical  correlation with patient history and other diagnostic information is  necessary to determine patient infection status.  Positive results do  not rule out bacterial infection or co-infection with other viruses. If result is PRESUMPTIVE POSTIVE SARS-CoV-2 nucleic acids MAY BE PRESENT.   A presumptive positive result was obtained on the submitted specimen  and confirmed on repeat testing.  While 2019 novel coronavirus    (SARS-CoV-2) nucleic acids may be present in the submitted sample  additional confirmatory testing may be necessary for epidemiological  and / or clinical management purposes  to differentiate between  SARS-CoV-2 and other Sarbecovirus currently known to infect humans.  If clinically indicated additional testing with an alternate test  methodology [phone]) is advised. The SARS-CoV-2 RNA is generally  detectable in upper and lower respiratory sp ecimens during the acute  phase of infection. The expected result is Negative. Fact Sheet for Patients:  StrictlyIdeas.no Fact Sheet for Healthcare Providers: BankingDealers.co.za This test is not yet approved or cleared by the [place] FDA and has been authorized for detection and/or diagnosis of SARS-CoV-2 by FDA under an Emergency Use Authorization (EUA).  This EUA will remain in effect (meaning this test can be used) for the duration of the COVID-19 declaration under Section 564(b)(1) of the Act, 21 U.S.C. section 360bbb-3(b)(1), unless the authorization is terminated or revoked sooner. Performed at [facility], [place] [address], [place], [place] [address]   Blood culture (routine x 2)     Status: None (Preliminary result)   Collection Time: 02/03/19  8:48 PM  Result Value Ref Range Status   Specimen Description BLOOD LEFT FOREARM  Final   Special Requests   Final    BOTTLES DRAWN AEROBIC AND ANAEROBIC Blood Culture adequate volume   Culture   Final    NO GROWTH < 12 HOURS Performed at [facility], [place] [address], [place], [place] [address]    Report Status PENDING  Incomplete  Blood culture (routine x 2)     Status: None (Preliminary result)   Collection Time: 02/03/19  8:53 PM  Result Value Ref Range Status   Specimen Description BLOOD LEFT HAND  Final   Special Requests   Final    BOTTLES DRAWN AEROBIC AND ANAEROBIC Blood Culture adequate volume   Culture   Final     NO GROWTH < 12 HOURS Performed at [facility], [place] [address] [place], [place] [address]    Report Status PENDING  Incomplete  SARS Coronavirus 2 (CEPHEID- Performed in [place] hospital lab), Hosp Order     Status: None   Collection Time: 02/04/19  8:52 AM  Result Value Ref Range Status   SARS Coronavirus 2 NEGATIVE NEGATIVE Final    Comment: (NOTE) If result is NEGATIVE SARS-CoV-2 target nucleic acids are NOT DETECTED. The SARS-CoV-2 RNA is generally detectable in upper and lower  respiratory specimens during the acute phase of infection. The lowest  concentration of SARS-CoV-2 viral copies this assay can detect is 250  copies / mL. A negative result does not preclude SARS-CoV-2 infection  and  should not be used as the sole basis for treatment or other  patient management decisions.  A negative result may occur with  improper specimen collection / handling, submission of specimen other  than nasopharyngeal swab, presence of viral mutation(s) within the  areas targeted by this assay, and inadequate number of viral copies  (<250 copies / mL). A negative result must be combined with clinical  observations, patient history, and epidemiological information. If result is POSITIVE SARS-CoV-2 target nucleic acids are DETECTED. The SARS-CoV-2 RNA is generally detectable in upper and lower  respiratory specimens dur ing the acute phase of infection.  Positive  results are indicative of active infection with SARS-CoV-2.  Clinical  correlation with patient history and other diagnostic information is  necessary to determine patient infection status.  Positive results do  not rule out bacterial infection or co-infection with other viruses. If result is PRESUMPTIVE POSTIVE SARS-CoV-2 nucleic acids MAY BE PRESENT.   A presumptive positive result was obtained on the submitted specimen  and confirmed on repeat testing.  While 2019 novel coronavirus  (SARS-CoV-2) nucleic acids may be  present in the submitted sample  additional confirmatory testing may be necessary for epidemiological  and / or clinical management purposes  to differentiate between  SARS-CoV-2 and other Sarbecovirus currently known to infect humans.  If clinically indicated additional testing with an alternate test  methodology [phone]) is advised. The SARS-CoV-2 RNA is generally  detectable in upper and lower respiratory sp ecimens during the acute  phase of infection. The expected result is Negative. Fact Sheet for Patients:  StrictlyIdeas.no Fact Sheet for Healthcare Providers: BankingDealers.co.za This test is not yet approved or cleared by the [place] FDA and has been authorized for detection and/or diagnosis of SARS-CoV-2 by FDA under an Emergency Use Authorization (EUA).  This EUA will remain in effect (meaning this test can be used) for the duration of the COVID-19 declaration under Section 564(b)(1) of the Act, 21 U.S.C. section 360bbb-3(b)(1), unless the authorization is terminated or revoked sooner. Performed at [facility], [place] [address], [place], [place] [address]          Radiology Studies: Dg Chest 2 View  Result Date: 02/05/2019 CLINICAL DATA:  Hypoxia.  Renal failure EXAM: CHEST - 2 VIEW COMPARISON:  Chest radiograph and chest CT Feb 03, 2019 FINDINGS: Central catheter tip is at the cavoatrial junction. No pneumothorax. Widespread alveolar opacity throughout the lungs persists with airspace consolidation in the right base. Appearance is similar to recent studies. There is cardiomegaly with pulmonary venous hypertension. There is aortic atherosclerosis. No adenopathy. No bone lesions. IMPRESSION: Appearance of the lungs is essentially stable compared to 2 days prior. There is stable cardiomegaly with pulmonary venous hypertension. Suspect congestive heart failure with extensive alveolar edema. There may also be a degree of  superimposed pneumonia and/or ARDS. More than one of these entities may be present concurrently. Aortic Atherosclerosis (ICD10-I70.0). Electronically Signed   By: Lowella Grip III M.D.   On: 02/05/2019 08:08   Ct Angio Chest Pe W Or Wo Contrast  Result Date: 02/03/2019 CLINICAL DATA:  Dyspnea.  Fever.  Cough.  Abdominal pain. EXAM: CT ANGIOGRAPHY CHEST WITH CONTRAST TECHNIQUE: Multidetector CT imaging of the chest was performed using the standard protocol during bolus administration of intravenous contrast. Multiplanar CT image reconstructions and MIPs were obtained to evaluate the vascular anatomy. CONTRAST:  155mL OMNIPAQUE IOHEXOL 350 MG/ML SOLN COMPARISON:  01/12/2019 chest CT angiogram. Chest radiograph from earlier today.  FINDINGS: Cardiovascular: The study is moderate quality for the evaluation of pulmonary embolism, some motion degradation. There are no convincing filling defects in the central, lobar, segmental or subsegmental pulmonary artery branches to suggest acute pulmonary embolism. Atherosclerotic nonaneurysmal thoracic aorta. Top-normal caliber main pulmonary artery (3.2 cm diameter), stable. Stable moderate cardiomegaly. No significant pericardial fluid/thickening. Right internal jugular central venous catheter terminates in the right atrium. Mediastinum/Nodes: No discrete thyroid nodules. Unremarkable esophagus. No pathologically enlarged axillary, mediastinal or hilar lymph nodes. Lungs/Pleura: No pneumothorax. Trace dependent left pleural effusion. No right pleural effusion. Extensive patchy peribronchovascular consolidation and ground-glass opacity throughout both lungs, most prominent in the upper lobes, significantly worsened from prior chest CT. Relative sparing of peripheral and basilar lungs. No discrete lung masses. Upper abdomen: Contrast reflux into the IVC and hepatic veins. Cholecystectomy. Musculoskeletal: No aggressive appearing focal osseous lesions. Mild thoracic  spondylosis. Review of the MIP images confirms the above findings. IMPRESSION: 1. No evidence of pulmonary embolism. 2. Moderate cardiomegaly. Extensive patchy central consolidation and ground-glass opacity symmetrically involving both lungs, significantly worsened from 01/12/2019 chest CT. Trace dependent left pleural effusion. Contrast reflux into the IVC and hepatic veins. Findings favor congestive heart failure with severe acute cardiogenic pulmonary edema. Multilobar pneumonia or ARDS cannot be excluded. Aortic Atherosclerosis (ICD10-I70.0). Electronically Signed   By: Ilona Sorrel M.D.   On: 02/03/2019 16:45   Ct Abdomen Pelvis W Contrast  Result Date: 02/05/2019 CLINICAL DATA:  Left lower quadrant abdominal pain and fever beginning 2 days ago. Hemodialysis patient. EXAM: CT ABDOMEN AND PELVIS WITH CONTRAST TECHNIQUE: Multidetector CT imaging of the abdomen and pelvis was performed using the standard protocol following bolus administration of intravenous contrast. CONTRAST:  52mL OMNIPAQUE IOHEXOL 300 MG/ML  SOLN COMPARISON:  12/03/2018 FINDINGS: Lower chest: Patchy interstitial pulmonary densities in both lower lungs are improved since the study of 2 months ago. This could relate to improving pneumonia or could indicate an element of interstitial edema. No effusion. Cardiomegaly. Hepatobiliary: Liver parenchyma appears normal. There has been previous cholecystectomy. Mild biliary ductal prominence consistent with previous cholecystectomy, similar to the previous study. Pancreas: Normal Spleen: Chronic benign appearing 1.7 cm low-density, not clinically relevant. Adrenals/Urinary Tract: The adrenal glands are normal. Renal size is normal. The kidneys are poorly functioning without visible excretion. No focal renal lesion. No hydroureteronephrosis. Bladder appears normal. Stomach/Bowel: No bowel pathology is seen. No evidence of ileus or obstruction. No evidence of diverticulosis or diverticulitis.  Vascular/Lymphatic: Aortic atherosclerosis. No aneurysm. Atherosclerosis of the branch vessels. IVC is normal. No adenopathy. Small retroperitoneal phleboliths. Reproductive: Previous hysterectomy. No pelvic mass. 1.5 cm fatty area associated with the left ovary, possibly a small benign dermoid, unchanged. Other: No free fluid or air. Musculoskeletal: Lower lumbar degenerative changes most severe at L4-5. IMPRESSION: No acute abdominal or pelvic finding. No specific cause of left lower quadrant pain. No sign of diverticulosis or diverticulitis or other acute bowel pathology. Interstitial pulmonary density, improved since the previous study. This could represent resolving pneumonia or edema. No effusions. Previous cholecystectomy and hysterectomy. Poorly functioning/nonfunctioning kidneys. Electronically Signed   By: Nelson Chimes M.D.   On: 02/05/2019 13:48   Vas [place] Upper Extremity Venous Duplex  Result Date: 02/05/2019 UPPER VENOUS STUDY  Indications: Pain Performing Technologist: June Leap RDMS, RVT  Examination Guidelines: A complete evaluation includes B-mode imaging, spectral Doppler, color Doppler, and power Doppler as needed of all accessible portions of each vessel. Bilateral testing is considered an integral part of a complete examination. Limited  examinations for reoccurring indications may be performed as noted.  Right Findings: +----------+------------+---------+-----------+----------+--------------+  RIGHT      Compressible Phasicity Spontaneous Properties    Summary      +----------+------------+---------+-----------+----------+--------------+  IJV                                                      Not visualized  +----------+------------+---------+-----------+----------+--------------+  Subclavian                 Yes        Yes                                +----------+------------+---------+-----------+----------+--------------+  Axillary       Full        Yes        Yes                                 +----------+------------+---------+-----------+----------+--------------+  Brachial       Full        Yes        Yes                                +----------+------------+---------+-----------+----------+--------------+  Radial         Full        Yes        Yes                                +----------+------------+---------+-----------+----------+--------------+  Ulnar          Full                                                      +----------+------------+---------+-----------+----------+--------------+  Cephalic       Full                                                      +----------+------------+---------+-----------+----------+--------------+  Basilic        Full                                                      +----------+------------+---------+-----------+----------+--------------+  Left Findings: +----------+------------+---------+-----------+----------+--------------+  LEFT       Compressible Phasicity Spontaneous Properties    Summary      +----------+------------+---------+-----------+----------+--------------+  Subclavian                                               Not visualized  +----------+------------+---------+-----------+----------+--------------+  Summary:  Right: No evidence of deep vein thrombosis in the upper extremity. No evidence of superficial vein thrombosis in the upper extremity.  *See table(s) above for measurements and observations.  Diagnosing physician: Servando Snare MD Electronically signed by Servando Snare MD on 02/05/2019 at 12:42:34 PM.    Final         Scheduled Meds:  sodium chloride   Intravenous Once   amLODipine  5 mg Oral QHS   aspirin EC  81 mg Oral Daily   Chlorhexidine Gluconate Cloth  6 each Topical Q0600   [START ON 02/07/2019] doxercalciferol  5 mcg Intravenous Q T,Th,Sa-HD   fluticasone  2 spray Each Nare Daily   gabapentin  100 mg Oral BID   heparin  5,000 Units Subcutaneous BID   insulin aspart  0-5 Units Subcutaneous  QHS   insulin aspart  0-9 Units Subcutaneous TID WC   insulin glargine  5 Units Subcutaneous QHS   pantoprazole  40 mg Oral Daily   polyethylene glycol  17 g Oral Daily   sevelamer carbonate  1,600 mg Oral TID WC   sodium chloride flush  3 mL Intravenous Q12H   Continuous Infusions:  sodium chloride     ceFEPime (MAXIPIME) IV       LOS: 2 days    Time spent: 31min   Domenic Polite, MD [facility]  02/05/2019, 2:24 PM

## 2019-02-05 NOTE — Plan of Care (Signed)
  Problem: Coping: Goal: Level of anxiety will decrease Outcome: Progressing Note: No s/s of anxiety noted.   

## 2019-02-05 NOTE — Progress Notes (Signed)
Pt had emesis after drinking contrast for CT of abdomen. Administered zofran with good effect.  Idolina Primer, RN

## 2019-02-05 NOTE — Progress Notes (Addendum)
[facility] Progress Note   Subjective:  Seen in room - c/o LUQ abd pain. No fever or CP. No dyspnea overnight. S/p HD again last evening with another 1.9L UF - did reach prior EDW. CXR this morning not improved from admit.   Objective Vitals:   02/05/19 0050 02/05/19 0522 02/05/19 0738 02/05/19 0918  BP:  110/68    Pulse:  99 99 95  Resp: 18 (!) 23  19  Temp:  98.2 F (36.8 C)    TempSrc:  Oral    SpO2:  100%  91%  Weight:  54.5 kg    Height:       Physical Exam General: Chronically ill appearing woman. On room air. Appears uncomfortable with abd pain LUQ Heart: RRR; no murmur Lungs: CTAB Abdomen: soft, non-tender Extremities: No LE edema Dialysis Access: R TDC + RUE AVF  Additional Objective Labs: Basic Metabolic Panel: Recent Labs  Lab 02/03/19 1400 02/04/19 0558 02/05/19 0352  NA 137 134* 133*  K 6.1* 4.3 4.0  CL 102 97* 97*  CO2 20* 25 26  GLUCOSE 94 98 102*  BUN 50* 14 13  CREATININE 9.02* 3.74* 3.25*  CALCIUM 8.9 8.3* 8.6*   Liver Function Tests: Recent Labs  Lab 02/03/19 1400  AST 31  ALT 14  ALKPHOS 119  BILITOT 0.6  PROT 7.4  ALBUMIN 2.3*   CBC: Recent Labs  Lab 02/03/19 1400 02/04/19 0558 02/05/19 0352  WBC 16.0* 14.3* 9.2  NEUTROABS 11.7*  --   --   HGB 8.1* 7.4* 7.1*  HCT 26.6* 23.4* 22.0*  MCV 74.1* 70.3* 69.6*  PLT 319 299 295   Blood Culture    Component Value Date/Time   SDES BLOOD LEFT HAND 02/03/2019 2053   SPECREQUEST  02/03/2019 2053    BOTTLES DRAWN AEROBIC AND ANAEROBIC Blood Culture adequate volume   CULT  02/03/2019 2053    NO GROWTH < 12 HOURS Performed at [facility], [place] [address], [place], [place] [address]    REPTSTATUS PENDING 02/03/2019 2053   Cardiac Enzymes: Recent Labs  Lab 02/03/19 1400  TROPONINI 0.08*   CBG: Recent Labs  Lab 02/04/19 1106 02/04/19 1652 02/04/19 2300 02/05/19 0524 02/05/19 0832  GLUCAP 189* 219* 113* 111* 114*   Iron Studies:  Recent Labs     02/05/19 0806  IRON 30  TIBC 204*  FERRITIN 722*   Studies/Results: Dg Chest 2 View  Result Date: 02/05/2019 CLINICAL DATA:  Hypoxia.  Renal failure EXAM: CHEST - 2 VIEW COMPARISON:  Chest radiograph and chest CT Feb 03, 2019 FINDINGS: Central catheter tip is at the cavoatrial junction. No pneumothorax. Widespread alveolar opacity throughout the lungs persists with airspace consolidation in the right base. Appearance is similar to recent studies. There is cardiomegaly with pulmonary venous hypertension. There is aortic atherosclerosis. No adenopathy. No bone lesions. IMPRESSION: Appearance of the lungs is essentially stable compared to 2 days prior. There is stable cardiomegaly with pulmonary venous hypertension. Suspect congestive heart failure with extensive alveolar edema. There may also be a degree of superimposed pneumonia and/or ARDS. More than one of these entities may be present concurrently. Aortic Atherosclerosis (ICD10-I70.0). Electronically Signed   By: Lowella Grip III M.D.   On: 02/05/2019 08:08   Ct Angio Chest Pe W Or Wo Contrast  Result Date: 02/03/2019 CLINICAL DATA:  Dyspnea.  Fever.  Cough.  Abdominal pain. EXAM: CT ANGIOGRAPHY CHEST WITH CONTRAST TECHNIQUE: Multidetector CT imaging of the chest was performed  using the standard protocol during bolus administration of intravenous contrast. Multiplanar CT image reconstructions and MIPs were obtained to evaluate the vascular anatomy. CONTRAST:  180mL OMNIPAQUE IOHEXOL 350 MG/ML SOLN COMPARISON:  01/12/2019 chest CT angiogram. Chest radiograph from earlier today. FINDINGS: Cardiovascular: The study is moderate quality for the evaluation of pulmonary embolism, some motion degradation. There are no convincing filling defects in the central, lobar, segmental or subsegmental pulmonary artery branches to suggest acute pulmonary embolism. Atherosclerotic nonaneurysmal thoracic aorta. Top-normal caliber main pulmonary artery (3.2 cm  diameter), stable. Stable moderate cardiomegaly. No significant pericardial fluid/thickening. Right internal jugular central venous catheter terminates in the right atrium. Mediastinum/Nodes: No discrete thyroid nodules. Unremarkable esophagus. No pathologically enlarged axillary, mediastinal or hilar lymph nodes. Lungs/Pleura: No pneumothorax. Trace dependent left pleural effusion. No right pleural effusion. Extensive patchy peribronchovascular consolidation and ground-glass opacity throughout both lungs, most prominent in the upper lobes, significantly worsened from prior chest CT. Relative sparing of peripheral and basilar lungs. No discrete lung masses. Upper abdomen: Contrast reflux into the IVC and hepatic veins. Cholecystectomy. Musculoskeletal: No aggressive appearing focal osseous lesions. Mild thoracic spondylosis. Review of the MIP images confirms the above findings. IMPRESSION: 1. No evidence of pulmonary embolism. 2. Moderate cardiomegaly. Extensive patchy central consolidation and ground-glass opacity symmetrically involving both lungs, significantly worsened from 01/12/2019 chest CT. Trace dependent left pleural effusion. Contrast reflux into the IVC and hepatic veins. Findings favor congestive heart failure with severe acute cardiogenic pulmonary edema. Multilobar pneumonia or ARDS cannot be excluded. Aortic Atherosclerosis (ICD10-I70.0). Electronically Signed   By: Ilona Sorrel M.D.   On: 02/03/2019 16:45   Dg Chest Portable 1 View  Result Date: 02/03/2019 CLINICAL DATA:  Shortness of breath. EXAM: PORTABLE CHEST 1 VIEW COMPARISON:  Radiograph of Jan 19, 2019. FINDINGS: Stable cardiomegaly. Right internal jugular dialysis catheter is unchanged in position. No pneumothorax or pleural effusion is noted. Stable bilateral reticular densities are noted concerning for edema. Minimal pleural effusions may be present. Bony thorax is unremarkable. IMPRESSION: Stable bilateral reticular densities are  noted concerning for pulmonary edema. Minimal pleural effusions. Electronically Signed   By: Marijo Conception M.D.   On: 02/03/2019 13:52   Vas [place] Upper Extremity Venous Duplex  Result Date: 02/04/2019 UPPER VENOUS STUDY  Indications: Pain Performing Technologist: June Leap RDMS, RVT  Examination Guidelines: A complete evaluation includes B-mode imaging, spectral Doppler, color Doppler, and power Doppler as needed of all accessible portions of each vessel. Bilateral testing is considered an integral part of a complete examination. Limited examinations for reoccurring indications may be performed as noted.  Right Findings: +----------+------------+---------+-----------+----------+--------------+ RIGHT     CompressiblePhasicitySpontaneousProperties   Summary     +----------+------------+---------+-----------+----------+--------------+ IJV                                                 Not visualized +----------+------------+---------+-----------+----------+--------------+ Subclavian               Yes       Yes                             +----------+------------+---------+-----------+----------+--------------+ Axillary      Full       Yes       Yes                             +----------+------------+---------+-----------+----------+--------------+  Brachial      Full       Yes       Yes                             +----------+------------+---------+-----------+----------+--------------+ Radial        Full       Yes       Yes                             +----------+------------+---------+-----------+----------+--------------+ Ulnar         Full                                                 +----------+------------+---------+-----------+----------+--------------+ Cephalic      Full                                                 +----------+------------+---------+-----------+----------+--------------+ Basilic       Full                                                  +----------+------------+---------+-----------+----------+--------------+  Left Findings: +----------+------------+---------+-----------+----------+--------------+ LEFT      CompressiblePhasicitySpontaneousProperties   Summary     +----------+------------+---------+-----------+----------+--------------+ Subclavian                                          Not visualized +----------+------------+---------+-----------+----------+--------------+  Summary:  Right: No evidence of deep vein thrombosis in the upper extremity. No evidence of superficial vein thrombosis in the upper extremity.  *See table(s) above for measurements and observations.    Preliminary    Medications: . sodium chloride    . ceFEPime (MAXIPIME) IV     . sodium chloride   Intravenous Once  . amLODipine  5 mg Oral QHS  . aspirin EC  81 mg Oral Daily  . calcitRIOL  0.25 mcg Oral Q M,W,F-HD  . Chlorhexidine Gluconate Cloth  6 each Topical Q0600  . fluticasone  2 spray Each Nare Daily  . gabapentin  100 mg Oral BID  . heparin  5,000 Units Subcutaneous BID  . insulin aspart  0-5 Units Subcutaneous QHS  . insulin aspart  0-9 Units Subcutaneous TID WC  . insulin glargine  5 Units Subcutaneous QHS  . pantoprazole  40 mg Oral Daily  . polyethylene glycol  17 g Oral Daily  . sevelamer carbonate  1,600 mg Oral TID WC  . sodium chloride flush  3 mL Intravenous Q12H    Dialysis Orders:  Assessment/Plan: TTS [facility] 4hr, 400/800, EDW 54kg, 2K/2.5Ca, TDC, heparin 3000 - Hectoral 3mcg Iv q HD - Mircera 200 q 2 weeks (last given 150mcg 5/14) - Venofer 100mg  x 10 ordered (s/p 3 doses so far)  Assessment/Plan: 1. Pulm edema +/- other:Low grade fever on admit - CT angio without PE, + diffuse ground-glass changes.S/p back to back HD - off Bi-pap, but CXR unchanged.  Per hospitalist note, getting pulm input. On Vanc/Cefepime. COVID negative. Looks better overall after HD x 2 and vol removal, however imaging  studies are worrisome, CXR did not improve w/ HD and CT shows more than simple edema. Will d/w PMD.  2. Abd pain - LUQ, L flank/ back, primary MD aware 3. Hyperkalemia: K 6.1 on admit, resolved with dialysis. 4. ESRD:Usual TTS schedule, s/p HD Tues and Wed. Hold today and see how she does, likely dialyze tomorrow. 5. Hypertension/volume:BP better, no edema on exam. CXR unchanged - doubt this is all fluid. Agree with pulm input. 6. Anemia:Hgb 7.1 (dropping here) although last outpatient Hgb 7.6 on 5/21 - not due for ESA yet. Transfuse if drops < 7. + abd pain today, getting abd CT. 7. Metabolic bone disease:Ca ok, Phos pending. Resume home binders/VDRA. 8. T2DM 9. 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 + underlying lung issues.  Veneta Penton, PA-C 02/05/2019, 10:30 AM  [facility] Pager: [phone]  Pt seen, examined and agree w assess/plan as above with additions as indicated.  [facility] 02/05/2019, 12:44 PM

## 2019-02-06 LAB — BASIC METABOLIC PANEL
Anion gap: 12 (ref 5–15)
BUN: 23 mg/dL (ref 8–23)
CO2: 23 mmol/L (ref 22–32)
Calcium: 8.5 mg/dL — ABNORMAL LOW (ref 8.9–10.3)
Chloride: 93 mmol/L — ABNORMAL LOW (ref 98–111)
Creatinine, Ser: 4.91 mg/dL — ABNORMAL HIGH (ref 0.44–1.00)
GFR calc Af Amer: 10 mL/min — ABNORMAL LOW (ref >60)
GFR calc non Af Amer: 9 mL/min — ABNORMAL LOW (ref >60)
Glucose, Bld: 76 mg/dL (ref 70–99)
Potassium: 4.8 mmol/L (ref 3.5–5.1)
Sodium: 128 mmol/L — ABNORMAL LOW (ref 135–145)

## 2019-02-06 LAB — TYPE AND SCREEN
ABO/RH(D): A POS
Antibody Screen: NEGATIVE
Unit division: 0

## 2019-02-06 LAB — CBC
HCT: 25.9 % — ABNORMAL LOW (ref 36.0–46.0)
Hemoglobin: 8.3 g/dL — ABNORMAL LOW (ref 12.0–15.0)
MCH: 23.4 pg — ABNORMAL LOW (ref 26.0–34.0)
MCHC: 32 g/dL (ref 30.0–36.0)
MCV: 73 fL — ABNORMAL LOW (ref 80.0–100.0)
Platelets: 368 10*3/uL (ref 150–400)
RBC: 3.55 MIL/uL — ABNORMAL LOW (ref 3.87–5.11)
RDW: 20.2 % — ABNORMAL HIGH (ref 11.5–15.5)
WBC: 9.4 10*3/uL (ref 4.0–10.5)
nRBC: 0 % (ref 0.0–0.2)

## 2019-02-06 LAB — BPAM RBC
Blood Product Expiration Date: 202006022359
ISSUE DATE / TIME: 202005281449
Unit Type and Rh: 600

## 2019-02-06 LAB — GLUCOSE, CAPILLARY
Glucose-Capillary: 130 mg/dL — ABNORMAL HIGH (ref 70–99)
Glucose-Capillary: 123 mg/dL — ABNORMAL HIGH (ref 70–99)
Glucose-Capillary: 125 mg/dL — ABNORMAL HIGH (ref 70–99)
Glucose-Capillary: 146 mg/dL — ABNORMAL HIGH (ref 70–99)

## 2019-02-06 LAB — PROCALCITONIN: Procalcitonin: 1.75 ng/mL

## 2019-02-06 MED ORDER — SENNOSIDES-DOCUSATE SODIUM 8.6-50 MG PO TABS
1.0000 | ORAL_TABLET | Freq: Two times a day (BID) | ORAL | Status: DC
  Administered 2019-02-06 – 2019-02-07 (×3): 1 via ORAL
  Filled 2019-02-06 (×3): qty 1

## 2019-02-06 MED ORDER — CHLORHEXIDINE GLUCONATE CLOTH 2 % EX PADS
6.0000 | MEDICATED_PAD | Freq: Every day | CUTANEOUS | Status: DC
  Administered 2019-02-07: 06:00:00 6 via TOPICAL

## 2019-02-06 NOTE — Progress Notes (Signed)
PROGRESS NOTE    Rhonda Lynch  FTD:[id number] DOB: 1955-10-09 DOA: 02/03/2019 PCP: Clent Demark, PA-C  Brief Narrative: Rhonda Lynch  is a 63 y.o. female, with past medical history significant for end-stage renal disease on hemodialysis, noncompliance, history of drug abuse and hypertension presented with left chest wall pain, pleuritic associated with increased shortness of breath, fever chills and productive cough.    She was sent from her hemodialysis center to the emergency room due to fever and shortness of breath  -In the ED patient was febrile to 101.8 CT angiogram of chest showed multilobular process suggestive of fluid overload versus pneumonia.  This is her 9th hospitalization since January . -Completed dialysis overnight, however was back on BiPAP through the night, , relatively somnolent   Assessment & Plan:   1.  Acute hypoxic respiratory failure -Febrile to 101.8 on admission, CT Angio chest with patchy consolidation and diffuse groundglass changes/ground glass changes are chronic from 2019. -Fever, elevated pro calcitonin level and patchy consolidation on chronic ground glass changes suggested bacterial pneumonia, in addition to fluid overload and chronic ground glass changes -COVID PCR negative x2 -Clinically improved with volume removal on dialysis and antibiotics -Weaned off O2 -History of ANCA positive vasculitis, she was treated for BOOP with steroids in 02/2018 per Pulm consult inpatient, h/o ANCA positive vasculitis and possibly underlying pneumonitis at baseline accounting for chronic underlying groundglass changes, worsened acutely in the setting of infection and fluid -d/w Pulm yesterday 5/28 review of CT suggests some of these findings are related to fluid overload and pneumonia, ILD appears less likely will need Follow up for this, Dr.Mannam will arrange FU for her and get HRCT when she is improved and at baseline  2.  Abdominal pain  -resolved now, etiology  unclear, due to severity of symptoms and impressive exam obtained a CT scan yesterday which was unremarkable -tolerating diet  3. Hyperkalemia -Unfortunately signs off from HD early regularly, long history of noncompliance -Resolved with urgent HD  3.  ESRD on hemodialysis with poor compliance -Nephrology following, improved following extra HD, back to dry weight  4.  Anemia of chronic disease -Hemoglobin 7.1 yesterday, anemia panel suggestive of iron deficiency and chronic disease -Continue EPO and iron with dialysis  5.  Type 2 diabetes mellitus -Continue Lantus and sliding scale  6.  History of CVA -Continue aspirin  7.  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  DVT prophylaxis: Heparin subcutaneous Code Status: Full code Family Communication: No family at bedside Disposition Plan: Home tomorrow if stable  Consultants:   Renal   Procedures:   Antimicrobials:    Subjective: -no abd pain, tolerating diet  Objective: Vitals:   02/05/19 2145 02/05/19 2250 02/06/19 0007 02/06/19 0404  BP: 105/90  100/63 123/69  Pulse:   (!) 102 (!) 103  Resp:  18    Temp:   99.2 F (37.3 C) 97.9 F (36.6 C)  TempSrc:   Oral Oral  SpO2:   90% 91%  Weight:    56.2 kg  Height:        Intake/Output Summary (Last 24 hours) at 02/06/2019 1422 Last data filed at 02/05/2019 2257 Gross per 24 hour  Intake 950.74 ml  Output --  Net 950.74 ml   Filed Weights   02/04/19 2202 02/05/19 0522 02/06/19 0404  Weight: 56.5 kg 54.5 kg 56.2 kg    Examination:  Gen: Frail chronically  ill female, awake alert oriented x2 today in better spirits HEENT: Right IJ HD catheter Lungs: Scattered rhonchi at both bases CVS: RRR,No Gallops,Rubs or new Murmurs Abd: Abdomen is soft no left upper quadrant tenderness cruciated today, bowel sounds present Extremities: No edema, right arm AV fistula Skin: no new rashes Psychiatry: Poor  insight   Data Reviewed:   CBC: Recent Labs  Lab 02/03/19 1400 02/04/19 0558 02/05/19 0352 02/06/19 0259  WBC 16.0* 14.3* 9.2 9.4  NEUTROABS 11.7*  --   --   --   HGB 8.1* 7.4* 7.1* 8.3*  HCT 26.6* 23.4* 22.0* 25.9*  MCV 74.1* 70.3* 69.6* 73.0*  PLT 319 299 295 675   Basic Metabolic Panel: Recent Labs  Lab 02/03/19 1400 02/04/19 0558 02/05/19 0352 02/06/19 0259  NA 137 134* 133* 128*  K 6.1* 4.3 4.0 4.8  CL 102 97* 97* 93*  CO2 20* 25 26 23   GLUCOSE 94 98 102* 76  BUN 50* 14 13 23   CREATININE 9.02* 3.74* 3.25* 4.91*  CALCIUM 8.9 8.3* 8.6* 8.5*   GFR: Estimated Creatinine Clearance: 8.6 mL/min (A) (by C-G formula based on SCr of 4.91 mg/dL (H)). Liver Function Tests: Recent Labs  Lab 02/03/19 1400  AST 31  ALT 14  ALKPHOS 119  BILITOT 0.6  PROT 7.4  ALBUMIN 2.3*   No results for input(s): LIPASE, AMYLASE in the last 168 hours. No results for input(s): AMMONIA in the last 168 hours. Coagulation Profile: No results for input(s): INR, PROTIME in the last 168 hours. Cardiac Enzymes: Recent Labs  Lab 02/03/19 1400  TROPONINI 0.08*   BNP (last 3 results) No results for input(s): PROBNP in the last 8760 hours. HbA1C: No results for input(s): HGBA1C in the last 72 hours. CBG: Recent Labs  Lab 02/05/19 1149 02/05/19 1700 02/05/19 2138 02/06/19 0749 02/06/19 1144  GLUCAP 107* 118* 126* 130* 123*   Lipid Profile: No results for input(s): CHOL, HDL, LDLCALC, TRIG, CHOLHDL, LDLDIRECT in the last 72 hours. Thyroid Function Tests: No results for input(s): TSH, T4TOTAL, FREET4, T3FREE, THYROIDAB in the last 72 hours. Anemia Panel: Recent Labs    02/05/19 0806  VITAMINB12 450  FOLATE 22.9  FERRITIN 722*  TIBC 204*  IRON 30  RETICCTPCT 2.7   Urine analysis:    Component Value Date/Time   COLORURINE YELLOW 12/03/2018 1208   APPEARANCEUR HAZY (A) 12/03/2018 1208   LABSPEC 1.020 12/03/2018 1208   PHURINE 6.0 12/03/2018 1208   GLUCOSEU 100 (A)  12/03/2018 1208   HGBUR LARGE (A) 12/03/2018 1208   HGBUR trace-intact 03/08/2010 1452   BILIRUBINUR NEGATIVE 12/03/2018 [place] 12/03/2018 1208   PROTEINUR >300 (A) 12/03/2018 1208   UROBILINOGEN 0.2 03/08/2010 1452   NITRITE NEGATIVE 12/03/2018 1208   LEUKOCYTESUR TRACE (A) 12/03/2018 1208   Sepsis Labs: @LABRCNTIP (procalcitonin:4,lacticidven:4)  ) Recent Results (from the past 240 hour(s))  SARS Coronavirus 2 (CEPHEID- Performed in [place] hospital lab), Hosp Order     Status: None   Collection Time: 02/03/19  2:23 PM  Result Value Ref Range Status   SARS Coronavirus 2 NEGATIVE NEGATIVE Final    Comment: (NOTE) If result is NEGATIVE SARS-CoV-2 target nucleic acids are NOT DETECTED. The SARS-CoV-2 RNA is generally detectable in upper and lower  respiratory specimens during the acute phase of infection. The lowest  concentration of SARS-CoV-2 viral copies this assay can detect is 250  copies / mL. A negative result does not preclude SARS-CoV-2 infection  and should  not be used as the sole basis for treatment or other  patient management decisions.  A negative result may occur with  improper specimen collection / handling, submission of specimen other  than nasopharyngeal swab, presence of viral mutation(s) within the  areas targeted by this assay, and inadequate number of viral copies  (<250 copies / mL). A negative result must be combined with clinical  observations, patient history, and epidemiological information. If result is POSITIVE SARS-CoV-2 target nucleic acids are DETECTED. The SARS-CoV-2 RNA is generally detectable in upper and lower  respiratory specimens dur ing the acute phase of infection.  Positive  results are indicative of active infection with SARS-CoV-2.  Clinical  correlation with patient history and other diagnostic information is  necessary to determine patient infection status.  Positive results do  not rule out bacterial  infection or co-infection with other viruses. If result is PRESUMPTIVE POSTIVE SARS-CoV-2 nucleic acids MAY BE PRESENT.   A presumptive positive result was obtained on the submitted specimen  and confirmed on repeat testing.  While 2019 novel coronavirus  (SARS-CoV-2) nucleic acids may be present in the submitted sample  additional confirmatory testing may be necessary for epidemiological  and / or clinical management purposes  to differentiate between  SARS-CoV-2 and other Sarbecovirus currently known to infect humans.  If clinically indicated additional testing with an alternate test  methodology [phone]) is advised. The SARS-CoV-2 RNA is generally  detectable in upper and lower respiratory sp ecimens during the acute  phase of infection. The expected result is Negative. Fact Sheet for Patients:  StrictlyIdeas.no Fact Sheet for Healthcare Providers: BankingDealers.co.za This test is not yet approved or cleared by the [place] FDA and has been authorized for detection and/or diagnosis of SARS-CoV-2 by FDA under an Emergency Use Authorization (EUA).  This EUA will remain in effect (meaning this test can be used) for the duration of the COVID-19 declaration under Section 564(b)(1) of the Act, 21 U.S.C. section 360bbb-3(b)(1), unless the authorization is terminated or revoked sooner. Performed at [facility], [place] [address] [place], [place] [address]   Blood culture (routine x 2)     Status: None (Preliminary result)   Collection Time: 02/03/19  8:48 PM  Result Value Ref Range Status   Specimen Description BLOOD LEFT FOREARM  Final   Special Requests   Final    BOTTLES DRAWN AEROBIC AND ANAEROBIC Blood Culture adequate volume   Culture   Final    NO GROWTH 3 DAYS Performed at [facility], [place] [address] [place],  [address]    Report Status PENDING  Incomplete  Blood culture (routine x 2)     Status: None  (Preliminary result)   Collection Time: 02/03/19  8:53 PM  Result Value Ref Range Status   Specimen Description BLOOD LEFT HAND  Final   Special Requests   Final    BOTTLES DRAWN AEROBIC AND ANAEROBIC Blood Culture adequate volume   Culture   Final    NO GROWTH 3 DAYS Performed at [facility], [place] [address], [place],  [address]    Report Status PENDING  Incomplete  SARS Coronavirus 2 (CEPHEID- Performed in [place] hospital lab), Hosp Order     Status: None   Collection Time: 02/04/19  8:52 AM  Result Value Ref Range Status   SARS Coronavirus 2 NEGATIVE NEGATIVE Final    Comment: (NOTE) If result is NEGATIVE SARS-CoV-2 target nucleic acids are NOT DETECTED. The  SARS-CoV-2 RNA is generally detectable in upper and lower  respiratory specimens during the acute phase of infection. The lowest  concentration of SARS-CoV-2 viral copies this assay can detect is 250  copies / mL. A negative result does not preclude SARS-CoV-2 infection  and should not be used as the sole basis for treatment or other  patient management decisions.  A negative result may occur with  improper specimen collection / handling, submission of specimen other  than nasopharyngeal swab, presence of viral mutation(s) within the  areas targeted by this assay, and inadequate number of viral copies  (<250 copies / mL). A negative result must be combined with clinical  observations, patient history, and epidemiological information. If result is POSITIVE SARS-CoV-2 target nucleic acids are DETECTED. The SARS-CoV-2 RNA is generally detectable in upper and lower  respiratory specimens dur ing the acute phase of infection.  Positive  results are indicative of active infection with SARS-CoV-2.  Clinical  correlation with patient history and other diagnostic information is  necessary to determine patient infection status.  Positive results do  not rule out bacterial infection or co-infection with other  viruses. If result is PRESUMPTIVE POSTIVE SARS-CoV-2 nucleic acids MAY BE PRESENT.   A presumptive positive result was obtained on the submitted specimen  and confirmed on repeat testing.  While 2019 novel coronavirus  (SARS-CoV-2) nucleic acids may be present in the submitted sample  additional confirmatory testing may be necessary for epidemiological  and / or clinical management purposes  to differentiate between  SARS-CoV-2 and other Sarbecovirus currently known to infect humans.  If clinically indicated additional testing with an alternate test  methodology [phone]) is advised. The SARS-CoV-2 RNA is generally  detectable in upper and lower respiratory sp ecimens during the acute  phase of infection. The expected result is Negative. Fact Sheet for Patients:  StrictlyIdeas.no Fact Sheet for Healthcare Providers: BankingDealers.co.za This test is not yet approved or cleared by the [place] FDA and has been authorized for detection and/or diagnosis of SARS-CoV-2 by FDA under an Emergency Use Authorization (EUA).  This EUA will remain in effect (meaning this test can be used) for the duration of the COVID-19 declaration under Section 564(b)(1) of the Act, 21 U.S.C. section 360bbb-3(b)(1), unless the authorization is terminated or revoked sooner. Performed at [facility], [place] [address] [place], [place] [address]          Radiology Studies: Dg Chest 2 View  Result Date: 02/05/2019 CLINICAL DATA:  Hypoxia.  Renal failure EXAM: CHEST - 2 VIEW COMPARISON:  Chest radiograph and chest CT Feb 03, 2019 FINDINGS: Central catheter tip is at the cavoatrial junction. No pneumothorax. Widespread alveolar opacity throughout the lungs persists with airspace consolidation in the right base. Appearance is similar to recent studies. There is cardiomegaly with pulmonary venous hypertension. There is aortic atherosclerosis. No adenopathy.  No bone lesions. IMPRESSION: Appearance of the lungs is essentially stable compared to 2 days prior. There is stable cardiomegaly with pulmonary venous hypertension. Suspect congestive heart failure with extensive alveolar edema. There may also be a degree of superimposed pneumonia and/or ARDS. More than one of these entities may be present concurrently. Aortic Atherosclerosis (ICD10-I70.0). Electronically Signed   By: Lowella Grip III M.D.   On: 02/05/2019 08:08   Ct Abdomen Pelvis W Contrast  Result Date: 02/05/2019 CLINICAL DATA:  Left lower quadrant abdominal pain and fever beginning 2 days ago. Hemodialysis patient. EXAM: CT ABDOMEN AND PELVIS WITH CONTRAST TECHNIQUE: Multidetector  CT imaging of the abdomen and pelvis was performed using the standard protocol following bolus administration of intravenous contrast. CONTRAST:  62mL OMNIPAQUE IOHEXOL 300 MG/ML  SOLN COMPARISON:  12/03/2018 FINDINGS: Lower chest: Patchy interstitial pulmonary densities in both lower lungs are improved since the study of 2 months ago. This could relate to improving pneumonia or could indicate an element of interstitial edema. No effusion. Cardiomegaly. Hepatobiliary: Liver parenchyma appears normal. There has been previous cholecystectomy. Mild biliary ductal prominence consistent with previous cholecystectomy, similar to the previous study. Pancreas: Normal Spleen: Chronic benign appearing 1.7 cm low-density, not clinically relevant. Adrenals/Urinary Tract: The adrenal glands are normal. Renal size is normal. The kidneys are poorly functioning without visible excretion. No focal renal lesion. No hydroureteronephrosis. Bladder appears normal. Stomach/Bowel: No bowel pathology is seen. No evidence of ileus or obstruction. No evidence of diverticulosis or diverticulitis. Vascular/Lymphatic: Aortic atherosclerosis. No aneurysm. Atherosclerosis of the branch vessels. IVC is normal. No adenopathy. Small retroperitoneal  phleboliths. Reproductive: Previous hysterectomy. No pelvic mass. 1.5 cm fatty area associated with the left ovary, possibly a small benign dermoid, unchanged. Other: No free fluid or air. Musculoskeletal: Lower lumbar degenerative changes most severe at L4-5. IMPRESSION: No acute abdominal or pelvic finding. No specific cause of left lower quadrant pain. No sign of diverticulosis or diverticulitis or other acute bowel pathology. Interstitial pulmonary density, improved since the previous study. This could represent resolving pneumonia or edema. No effusions. Previous cholecystectomy and hysterectomy. Poorly functioning/nonfunctioning kidneys. Electronically Signed   By: Nelson Chimes M.D.   On: 02/05/2019 13:48   Vas [place] Upper Extremity Venous Duplex  Result Date: 02/05/2019 UPPER VENOUS STUDY  Indications: Pain Performing Technologist: June Leap RDMS, RVT  Examination Guidelines: A complete evaluation includes B-mode imaging, spectral Doppler, color Doppler, and power Doppler as needed of all accessible portions of each vessel. Bilateral testing is considered an integral part of a complete examination. Limited examinations for reoccurring indications may be performed as noted.  Right Findings: +----------+------------+---------+-----------+----------+--------------+  RIGHT      Compressible Phasicity Spontaneous Properties    Summary      +----------+------------+---------+-----------+----------+--------------+  IJV                                                      Not visualized  +----------+------------+---------+-----------+----------+--------------+  Subclavian                 Yes        Yes                                +----------+------------+---------+-----------+----------+--------------+  Axillary       Full        Yes        Yes                                +----------+------------+---------+-----------+----------+--------------+  Brachial       Full        Yes        Yes                                 +----------+------------+---------+-----------+----------+--------------+  Radial  Full        Yes        Yes                                +----------+------------+---------+-----------+----------+--------------+  Ulnar          Full                                                      +----------+------------+---------+-----------+----------+--------------+  Cephalic       Full                                                      +----------+------------+---------+-----------+----------+--------------+  Basilic        Full                                                      +----------+------------+---------+-----------+----------+--------------+  Left Findings: +----------+------------+---------+-----------+----------+--------------+  LEFT       Compressible Phasicity Spontaneous Properties    Summary      +----------+------------+---------+-----------+----------+--------------+  Subclavian                                               Not visualized  +----------+------------+---------+-----------+----------+--------------+  Summary:  Right: No evidence of deep vein thrombosis in the upper extremity. No evidence of superficial vein thrombosis in the upper extremity.  *See table(s) above for measurements and observations.  Diagnosing physician: Servando Snare MD Electronically signed by Servando Snare MD on 02/05/2019 at 12:42:34 PM.    Final         Scheduled Meds:  amLODipine  5 mg Oral QHS   aspirin EC  81 mg Oral Daily   Chlorhexidine Gluconate Cloth  6 each Topical Q0600   [START ON 02/07/2019] doxercalciferol  5 mcg Intravenous Q T,Th,Sa-HD   fluticasone  2 spray Each Nare Daily   gabapentin  100 mg Oral BID   heparin  5,000 Units Subcutaneous BID   insulin aspart  0-5 Units Subcutaneous QHS   insulin aspart  0-9 Units Subcutaneous TID WC   insulin glargine  5 Units Subcutaneous QHS   pantoprazole  40 mg Oral Daily   polyethylene glycol  17 g Oral Daily    senna-docusate  1 tablet Oral BID   sevelamer carbonate  1,600 mg Oral TID WC   sodium chloride flush  3 mL Intravenous Q12H   Continuous Infusions:  sodium chloride Stopped (02/05/19 2257)   ceFEPime (MAXIPIME) IV Stopped (02/05/19 2257)     LOS: 3 days    Time spent: 59min   Domenic Polite, MD [facility]  02/06/2019, 2:22 PM

## 2019-02-06 NOTE — Progress Notes (Signed)
PT Cancellation Note  Patient Details Name: DESTYNI HOPPEL MRN: [id number] DOB: [birth year]   Cancelled Treatment:    Reason Eval/Treat Not Completed: Pain limiting ability to participate. Pt declining PT eval due to stomach pain. PT to re-attempt as time allows.   Lorriane Shire 02/06/2019, 12:03 PM   Lorrin Goodell, PT  Office # [phone] Pager [phone]

## 2019-02-06 NOTE — Progress Notes (Signed)
Patient has not needed NIV for >48hrs, machine removed from room. Order is this active/pending in case patient needs it. Patient is currently stable at this time no distress or complications noted.

## 2019-02-06 NOTE — Progress Notes (Signed)
[facility] Progress Note   Subjective:  Seen in room, abd pain resolved, feeling better, playing games on her phone  Objective Vitals:   02/05/19 2145 02/05/19 2250 02/06/19 0007 02/06/19 0404  BP: 105/90  100/63 123/69  Pulse:   (!) 102 (!) 103  Resp:  18    Temp:   99.2 F (37.3 C) 97.9 F (36.6 C)  TempSrc:   Oral Oral  SpO2:   90% 91%  Weight:    56.2 kg  Height:       Physical Exam General: Chronically ill , no distress, calm Heart: RRR; no murmur Lungs: CTAB Abdomen: soft, non-tender Extremities: No LE edema Dialysis Access: R TDC + RUE AVF  Additional Objective Labs: Basic Metabolic Panel: Recent Labs  Lab 02/04/19 0558 02/05/19 0352 02/06/19 0259  NA 134* 133* 128*  K 4.3 4.0 4.8  CL 97* 97* 93*  CO2 25 26 23   GLUCOSE 98 102* 76  BUN 14 13 23   CREATININE 3.74* 3.25* 4.91*  CALCIUM 8.3* 8.6* 8.5*   Liver Function Tests: Recent Labs  Lab 02/03/19 1400  AST 31  ALT 14  ALKPHOS 119  BILITOT 0.6  PROT 7.4  ALBUMIN 2.3*   CBC: Recent Labs  Lab 02/03/19 1400 02/04/19 0558 02/05/19 0352 02/06/19 0259  WBC 16.0* 14.3* 9.2 9.4  NEUTROABS 11.7*  --   --   --   HGB 8.1* 7.4* 7.1* 8.3*  HCT 26.6* 23.4* 22.0* 25.9*  MCV 74.1* 70.3* 69.6* 73.0*  PLT 319 299 295 368   Blood Culture    Component Value Date/Time   SDES BLOOD LEFT HAND 02/03/2019 2053   SPECREQUEST  02/03/2019 2053    BOTTLES DRAWN AEROBIC AND ANAEROBIC Blood Culture adequate volume   CULT  02/03/2019 2053    NO GROWTH 3 DAYS Performed at [facility], [address] [place], [place] [address]    REPTSTATUS PENDING 02/03/2019 2053   Cardiac Enzymes: Recent Labs  Lab 02/03/19 1400  TROPONINI 0.08*   CBG: Recent Labs  Lab 02/05/19 1149 02/05/19 1700 02/05/19 2138 02/06/19 0749 02/06/19 1144  GLUCAP 107* 118* 126* 130* 123*   Iron Studies:  Recent Labs    02/05/19 0806  IRON 30  TIBC 204*  FERRITIN 722*   Studies/Results: Dg Chest 2  View  Result Date: 02/05/2019 CLINICAL DATA:  Hypoxia.  Renal failure EXAM: CHEST - 2 VIEW COMPARISON:  Chest radiograph and chest CT Feb 03, 2019 FINDINGS: Central catheter tip is at the cavoatrial junction. No pneumothorax. Widespread alveolar opacity throughout the lungs persists with airspace consolidation in the right base. Appearance is similar to recent studies. There is cardiomegaly with pulmonary venous hypertension. There is aortic atherosclerosis. No adenopathy. No bone lesions. IMPRESSION: Appearance of the lungs is essentially stable compared to 2 days prior. There is stable cardiomegaly with pulmonary venous hypertension. Suspect congestive heart failure with extensive alveolar edema. There may also be a degree of superimposed pneumonia and/or ARDS. More than one of these entities may be present concurrently. Aortic Atherosclerosis (ICD10-I70.0). Electronically Signed   By: Lowella Grip III M.D.   On: 02/05/2019 08:08   Ct Abdomen Pelvis W Contrast  Result Date: 02/05/2019 CLINICAL DATA:  Left lower quadrant abdominal pain and fever beginning 2 days ago. Hemodialysis patient. EXAM: CT ABDOMEN AND PELVIS WITH CONTRAST TECHNIQUE: Multidetector CT imaging of the abdomen and pelvis was performed using the standard protocol following bolus administration of intravenous contrast. CONTRAST:  63mL  OMNIPAQUE IOHEXOL 300 MG/ML  SOLN COMPARISON:  12/03/2018 FINDINGS: Lower chest: Patchy interstitial pulmonary densities in both lower lungs are improved since the study of 2 months ago. This could relate to improving pneumonia or could indicate an element of interstitial edema. No effusion. Cardiomegaly. Hepatobiliary: Liver parenchyma appears normal. There has been previous cholecystectomy. Mild biliary ductal prominence consistent with previous cholecystectomy, similar to the previous study. Pancreas: Normal Spleen: Chronic benign appearing 1.7 cm low-density, not clinically relevant. Adrenals/Urinary  Tract: The adrenal glands are normal. Renal size is normal. The kidneys are poorly functioning without visible excretion. No focal renal lesion. No hydroureteronephrosis. Bladder appears normal. Stomach/Bowel: No bowel pathology is seen. No evidence of ileus or obstruction. No evidence of diverticulosis or diverticulitis. Vascular/Lymphatic: Aortic atherosclerosis. No aneurysm. Atherosclerosis of the branch vessels. IVC is normal. No adenopathy. Small retroperitoneal phleboliths. Reproductive: Previous hysterectomy. No pelvic mass. 1.5 cm fatty area associated with the left ovary, possibly a small benign dermoid, unchanged. Other: No free fluid or air. Musculoskeletal: Lower lumbar degenerative changes most severe at L4-5. IMPRESSION: No acute abdominal or pelvic finding. No specific cause of left lower quadrant pain. No sign of diverticulosis or diverticulitis or other acute bowel pathology. Interstitial pulmonary density, improved since the previous study. This could represent resolving pneumonia or edema. No effusions. Previous cholecystectomy and hysterectomy. Poorly functioning/nonfunctioning kidneys. Electronically Signed   By: Nelson Chimes M.D.   On: 02/05/2019 13:48   Vas [place] Upper Extremity Venous Duplex  Result Date: 02/05/2019 UPPER VENOUS STUDY  Indications: Pain Performing Technologist: June Leap RDMS, RVT  Examination Guidelines: A complete evaluation includes B-mode imaging, spectral Doppler, color Doppler, and power Doppler as needed of all accessible portions of each vessel. Bilateral testing is considered an integral part of a complete examination. Limited examinations for reoccurring indications may be performed as noted.  Right Findings: +----------+------------+---------+-----------+----------+--------------+ RIGHT     CompressiblePhasicitySpontaneousProperties   Summary     +----------+------------+---------+-----------+----------+--------------+ IJV                                                  Not visualized +----------+------------+---------+-----------+----------+--------------+ Subclavian               Yes       Yes                             +----------+------------+---------+-----------+----------+--------------+ Axillary      Full       Yes       Yes                             +----------+------------+---------+-----------+----------+--------------+ Brachial      Full       Yes       Yes                             +----------+------------+---------+-----------+----------+--------------+ Radial        Full       Yes       Yes                             +----------+------------+---------+-----------+----------+--------------+ Ulnar  Full                                                 +----------+------------+---------+-----------+----------+--------------+ Cephalic      Full                                                 +----------+------------+---------+-----------+----------+--------------+ Basilic       Full                                                 +----------+------------+---------+-----------+----------+--------------+  Left Findings: +----------+------------+---------+-----------+----------+--------------+ LEFT      CompressiblePhasicitySpontaneousProperties   Summary     +----------+------------+---------+-----------+----------+--------------+ Subclavian                                          Not visualized +----------+------------+---------+-----------+----------+--------------+  Summary:  Right: No evidence of deep vein thrombosis in the upper extremity. No evidence of superficial vein thrombosis in the upper extremity.  *See table(s) above for measurements and observations.  Diagnosing physician: Servando Snare MD Electronically signed by Servando Snare MD on 02/05/2019 at 12:42:34 PM.    Final    Medications: . sodium chloride Stopped (02/05/19 2257)  . ceFEPime (MAXIPIME) IV Stopped  (02/05/19 2257)   . amLODipine  5 mg Oral QHS  . aspirin EC  81 mg Oral Daily  . Chlorhexidine Gluconate Cloth  6 each Topical Q0600  . [START ON 02/07/2019] doxercalciferol  5 mcg Intravenous Q T,Th,Sa-HD  . fluticasone  2 spray Each Nare Daily  . gabapentin  100 mg Oral BID  . heparin  5,000 Units Subcutaneous BID  . insulin aspart  0-5 Units Subcutaneous QHS  . insulin aspart  0-9 Units Subcutaneous TID WC  . insulin glargine  5 Units Subcutaneous QHS  . pantoprazole  40 mg Oral Daily  . polyethylene glycol  17 g Oral Daily  . senna-docusate  1 tablet Oral BID  . sevelamer carbonate  1,600 mg Oral TID WC  . sodium chloride flush  3 mL Intravenous Q12H    Dialysis Orders:  Assessment/Plan: TTS [facility] 4hr, 400/800, EDW 54kg, 2K/2.5Ca, TDC, heparin 3000 - Hectoral 31mcg Iv q HD - Mircera 200 q 2 weeks (last given 16mcg 5/14) - Venofer 100mg  x 10 ordered (s/p 3 doses so far)  Assessment/Plan: 1. Pulm edema/ vol overload/ SOB: resolved, on RA. Has persistent CXR changes not sure cause, have d/w primary 2. ESRD on HD: usual HD TTS. Plan HD tomorrow if still here.  3. Hypertension/volume:BP better, no edema on exam. CXR unchanged - doubt this is all fluid. Agree with pulm input. 4. Anemia ckd:Hgb 7.1 yest, sp 1u prbc. Hb 8.6 today. Due for esa, will give tomorrow 100 ug darbe SQ.  5. Metabolic bone disease:Ca ok, Phos pending. Resume home binders/VDRA. 6. T2DM 7. 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 + underlying lung issues.  Kelly Splinter, MD 02/06/2019, 2:33 PM

## 2019-02-06 NOTE — Progress Notes (Signed)
Pt refused BIPAP for the night resting comfortably. RT will cont to monitor.

## 2019-02-07 DIAGNOSIS — R0902 Hypoxemia: Secondary | ICD-10-CM

## 2019-02-07 DIAGNOSIS — Z992 Dependence on renal dialysis: Secondary | ICD-10-CM

## 2019-02-07 LAB — BASIC METABOLIC PANEL
Anion gap: 12 (ref 5–15)
BUN: 35 mg/dL — ABNORMAL HIGH (ref 8–23)
CO2: 24 mmol/L (ref 22–32)
Calcium: 8.4 mg/dL — ABNORMAL LOW (ref 8.9–10.3)
Chloride: 93 mmol/L — ABNORMAL LOW (ref 98–111)
Creatinine, Ser: 7.36 mg/dL — ABNORMAL HIGH (ref 0.44–1.00)
GFR calc Af Amer: 6 mL/min — ABNORMAL LOW (ref >60)
GFR calc non Af Amer: 5 mL/min — ABNORMAL LOW (ref >60)
Glucose, Bld: 87 mg/dL (ref 70–99)
Potassium: 5 mmol/L (ref 3.5–5.1)
Sodium: 129 mmol/L — ABNORMAL LOW (ref 135–145)

## 2019-02-07 LAB — GLUCOSE, CAPILLARY: Glucose-Capillary: 110 mg/dL — ABNORMAL HIGH (ref 70–99)

## 2019-02-07 LAB — CBC
HCT: 24.5 % — ABNORMAL LOW (ref 36.0–46.0)
Hemoglobin: 8 g/dL — ABNORMAL LOW (ref 12.0–15.0)
MCH: 24 pg — ABNORMAL LOW (ref 26.0–34.0)
MCHC: 32.7 g/dL (ref 30.0–36.0)
MCV: 73.6 fL — ABNORMAL LOW (ref 80.0–100.0)
Platelets: 320 10*3/uL (ref 150–400)
RBC: 3.33 MIL/uL — ABNORMAL LOW (ref 3.87–5.11)
RDW: 20.4 % — ABNORMAL HIGH (ref 11.5–15.5)
WBC: 9.9 10*3/uL (ref 4.0–10.5)
nRBC: 0 % (ref 0.0–0.2)

## 2019-02-07 MED ORDER — CEFDINIR 300 MG PO CAPS: 300.0000 mg | ORAL_CAPSULE | Freq: Two times a day (BID) | ORAL | 0 refills | Status: AC

## 2019-02-07 MED ORDER — HEPARIN SODIUM (PORCINE) 1000 UNIT/ML DIALYSIS
3000.0000 [IU] | Freq: Once | INTRAMUSCULAR | Status: AC
  Administered 2019-02-07: 3000 [IU] via INTRAVENOUS_CENTRAL
  Administered 2019-02-07: 3400 [IU] via INTRAVENOUS_CENTRAL

## 2019-02-07 MED ORDER — PANTOPRAZOLE SODIUM 40 MG PO TBEC: 40.0000 mg | DELAYED_RELEASE_TABLET | Freq: Every day | ORAL | 0 refills | Status: AC

## 2019-02-07 MED ORDER — DARBEPOETIN ALFA 200 MCG/0.4ML IJ SOSY
PREFILLED_SYRINGE | INTRAMUSCULAR | Status: AC
  Administered 2019-02-07: 200 ug via INTRAVENOUS
  Filled 2019-02-07: qty 0.4

## 2019-02-07 MED ORDER — POLYETHYLENE GLYCOL 3350 17 G PO PACK: 17.0000 g | PACK | Freq: Every day | ORAL | 0 refills | Status: AC

## 2019-02-07 MED ORDER — OXYCODONE HCL 5 MG PO TABS: 5.0000 mg | ORAL_TABLET | Freq: Four times a day (QID) | ORAL | 0 refills | Status: AC | PRN

## 2019-02-07 MED ORDER — ASPIRIN 81 MG PO TBEC: 81.0000 mg | DELAYED_RELEASE_TABLET | Freq: Every day | ORAL | 0 refills | Status: AC

## 2019-02-07 MED ORDER — AMLODIPINE BESYLATE 5 MG PO TABS: 5.0000 mg | ORAL_TABLET | Freq: Every day | ORAL | 0 refills | Status: AC

## 2019-02-07 MED ORDER — CEFDINIR 300 MG PO CAPS: 300.0000 mg | ORAL_CAPSULE | Freq: Two times a day (BID) | ORAL | Status: DC

## 2019-02-07 MED ORDER — DOXERCALCIFEROL 4 MCG/2ML IV SOLN
INTRAVENOUS | Status: AC
  Administered 2019-02-07: 5 ug via INTRAVENOUS
  Filled 2019-02-07: qty 4

## 2019-02-07 MED ORDER — SEVELAMER CARBONATE 800 MG PO TABS: 1600.0000 mg | ORAL_TABLET | Freq: Three times a day (TID) | ORAL | 0 refills | Status: AC

## 2019-02-07 MED ORDER — HEPARIN SODIUM (PORCINE) 1000 UNIT/ML IJ SOLN
INTRAMUSCULAR | Status: AC
  Administered 2019-02-07: 3000 [IU] via INTRAVENOUS_CENTRAL
  Filled 2019-02-07: qty 3

## 2019-02-07 MED ORDER — GABAPENTIN 100 MG PO CAPS: 100.0000 mg | ORAL_CAPSULE | Freq: Two times a day (BID) | ORAL | 0 refills | Status: AC

## 2019-02-07 MED ORDER — CEFDINIR 300 MG PO CAPS
300.0000 mg | ORAL_CAPSULE | ORAL | Status: DC
  Filled 2019-02-07: qty 1

## 2019-02-07 MED ORDER — HEPARIN SODIUM (PORCINE) 1000 UNIT/ML IJ SOLN
INTRAMUSCULAR | Status: AC
  Administered 2019-02-07: 3400 [IU] via INTRAVENOUS_CENTRAL
  Filled 2019-02-07: qty 4

## 2019-02-07 MED ORDER — CALCITRIOL 0.25 MCG PO CAPS: 0.2500 ug | ORAL_CAPSULE | ORAL | 0 refills | Status: AC

## 2019-02-07 MED ORDER — DARBEPOETIN ALFA 200 MCG/0.4ML IJ SOSY
200.0000 ug | PREFILLED_SYRINGE | INTRAMUSCULAR | Status: DC
  Administered 2019-02-07: 200 ug via INTRAVENOUS

## 2019-02-07 MED ORDER — INSULIN GLARGINE 100 UNIT/ML ~~LOC~~ SOLN: 5.0000 [IU] | Freq: Every day | SUBCUTANEOUS | Status: AC

## 2019-02-07 NOTE — Progress Notes (Signed)
Pt stable, dc home via wheelchair, NT accompanied to entrance

## 2019-02-07 NOTE — Progress Notes (Addendum)
[facility] Progress Note   Subjective:  Seen on HD - 2L UF goal and tolerating. Breathing comfortably on room air. Drowsy/?disoriented this morning.   Objective: Today's Vitals   02/07/19 0730 02/07/19 0800 02/07/19 0830 02/07/19 0900  BP: (!) 114/52 (!) 101/52 115/66 134/72  Pulse: 94 (!) 104 95 95  Resp: 16 17 16 20   Temp:      TempSrc:      SpO2:      Weight:      Height:      PainSc:       Physical Exam General:Chronically ill-appearing woman, NAD. Laying on back, room air. Heart:RRR; no murmur Lungs:CTAB Abdomen:soft, non-tender Extremities:No LE edema Dialysis Access:R TDC + RUE AVF  Additional Objective Labs: Basic Metabolic Panel: Recent Labs  Lab 02/05/19 0352 02/06/19 0259 02/07/19 0325  NA 133* 128* 129*  K 4.0 4.8 5.0  CL 97* 93* 93*  CO2 26 23 24   GLUCOSE 102* 76 87  BUN 13 23 35*  CREATININE 3.25* 4.91* 7.36*  CALCIUM 8.6* 8.5* 8.4*   Liver Function Tests: Recent Labs  Lab 02/03/19 1400  AST 31  ALT 14  ALKPHOS 119  BILITOT 0.6  PROT 7.4  ALBUMIN 2.3*   CBC: Recent Labs  Lab 02/03/19 1400 02/04/19 0558 02/05/19 0352 02/06/19 0259 02/07/19 0325  WBC 16.0* 14.3* 9.2 9.4 9.9  NEUTROABS 11.7*  --   --   --   --   HGB 8.1* 7.4* 7.1* 8.3* 8.0*  HCT 26.6* 23.4* 22.0* 25.9* 24.5*  MCV 74.1* 70.3* 69.6* 73.0* 73.6*  PLT 319 299 295 368 320   Blood Culture    Component Value Date/Time   SDES BLOOD LEFT HAND 02/03/2019 2053   SPECREQUEST  02/03/2019 2053    BOTTLES DRAWN AEROBIC AND ANAEROBIC Blood Culture adequate volume   CULT  02/03/2019 2053    NO GROWTH 3 DAYS Performed at [facility], [place] [address] [place], [place] [address]    REPTSTATUS PENDING 02/03/2019 2053    Cardiac Enzymes: Recent Labs  Lab 02/03/19 1400  TROPONINI 0.08*   CBG: Recent Labs  Lab 02/05/19 2138 02/06/19 0749 02/06/19 1144 02/06/19 1627 02/06/19 2111  GLUCAP 126* 130* 123* 125* 146*   Iron Studies:  Recent  Labs    02/05/19 0806  IRON 30  TIBC 204*  FERRITIN 722*   Studies/Results: Ct Abdomen Pelvis W Contrast  Result Date: 02/05/2019 CLINICAL DATA:  Left lower quadrant abdominal pain and fever beginning 2 days ago. Hemodialysis patient. EXAM: CT ABDOMEN AND PELVIS WITH CONTRAST TECHNIQUE: Multidetector CT imaging of the abdomen and pelvis was performed using the standard protocol following bolus administration of intravenous contrast. CONTRAST:  59mL OMNIPAQUE IOHEXOL 300 MG/ML  SOLN COMPARISON:  12/03/2018 FINDINGS: Lower chest: Patchy interstitial pulmonary densities in both lower lungs are improved since the study of 2 months ago. This could relate to improving pneumonia or could indicate an element of interstitial edema. No effusion. Cardiomegaly. Hepatobiliary: Liver parenchyma appears normal. There has been previous cholecystectomy. Mild biliary ductal prominence consistent with previous cholecystectomy, similar to the previous study. Pancreas: Normal Spleen: Chronic benign appearing 1.7 cm low-density, not clinically relevant. Adrenals/Urinary Tract: The adrenal glands are normal. Renal size is normal. The kidneys are poorly functioning without visible excretion. No focal renal lesion. No hydroureteronephrosis. Bladder appears normal. Stomach/Bowel: No bowel pathology is seen. No evidence of ileus or obstruction. No evidence of diverticulosis or diverticulitis. Vascular/Lymphatic: Aortic atherosclerosis. No aneurysm. Atherosclerosis  of the branch vessels. IVC is normal. No adenopathy. Small retroperitoneal phleboliths. Reproductive: Previous hysterectomy. No pelvic mass. 1.5 cm fatty area associated with the left ovary, possibly a small benign dermoid, unchanged. Other: No free fluid or air. Musculoskeletal: Lower lumbar degenerative changes most severe at L4-5. IMPRESSION: No acute abdominal or pelvic finding. No specific cause of left lower quadrant pain. No sign of diverticulosis or diverticulitis  or other acute bowel pathology. Interstitial pulmonary density, improved since the previous study. This could represent resolving pneumonia or edema. No effusions. Previous cholecystectomy and hysterectomy. Poorly functioning/nonfunctioning kidneys. Electronically Signed   By: Nelson Chimes M.D.   On: 02/05/2019 13:48   Medications: . sodium chloride Stopped (02/06/19 2131)  . ceFEPime (MAXIPIME) IV Stopped (02/06/19 2131)   . amLODipine  5 mg Oral QHS  . aspirin EC  81 mg Oral Daily  . Chlorhexidine Gluconate Cloth  6 each Topical Q0600  . Chlorhexidine Gluconate Cloth  6 each Topical Q0600  . doxercalciferol  5 mcg Intravenous Q T,Th,Sa-HD  . fluticasone  2 spray Each Nare Daily  . gabapentin  100 mg Oral BID  . [START ON 02/08/2019] heparin  3,000 Units Dialysis Once in dialysis  . heparin  5,000 Units Subcutaneous BID  . insulin aspart  0-5 Units Subcutaneous QHS  . insulin aspart  0-9 Units Subcutaneous TID WC  . insulin glargine  5 Units Subcutaneous QHS  . pantoprazole  40 mg Oral Daily  . polyethylene glycol  17 g Oral Daily  . senna-docusate  1 tablet Oral BID  . sevelamer carbonate  1,600 mg Oral TID WC  . sodium chloride flush  3 mL Intravenous Q12H    Dialysis Orders: TTS [facility] 4hr, 400/800, EDW 54kg, 2K/2.5Ca, TDC, heparin 3000 - Hectoral 25mcg Iv q HD - Mircera 200 q 2 weeks (last given 167mcg 5/14) - Venofer 100mg  x 10 ordered (s/p 3 doses so far)  Assessment/Plan: 1. Pulm edema/ vol overload/ SOB: resolved, on RA. Has persistent CXR changes. Her hospitalist d/w pulmonary who will f/u as outpatient. 2. ESRD on HD: usual HD TTS - HD today. 3. Hypertension/volume:BPbetter, no edema on exam. CXR unchanged - doubt this is all fluid. 4. Anemia LSL:HTD4. S/p 1U PRBCs 5/28. Giving Aranesp 24mcg today. 5. Metabolic bone disease:Ca ok, Phos pending. Resume home binders/VDRA. 6. T2DM 7. 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 + underlying lung  issues.  Veneta Penton, PA-C 02/07/2019, 9:05 AM  [facility] Pager: [phone]  Pt seen, examined and agree w A/P as above.  Kelly Splinter  MD 02/07/2019, 5:14 PM

## 2019-02-07 NOTE — Progress Notes (Signed)
PT Cancellation Note  Patient Details Name: Rhonda Lynch MRN: [id number] DOB: Jul 22, 1956   Cancelled Treatment:    Reason Eval/Treat Not Completed: Patient declined, no reason specified Tired from HD this AM. Agreeable to work with therapy later today. I will return to work with [name] later this afternoon.   Ellouise Newer 02/07/2019, 12:23 PM

## 2019-02-07 NOTE — Discharge Instructions (Signed)
Acute Respiratory Failure, Adult ° °Acute respiratory failure occurs when there is not enough oxygen passing from your lungs to your body. When this happens, your lungs have trouble removing carbon dioxide from the blood. This causes your blood oxygen level to drop too low as carbon dioxide builds up. °Acute respiratory failure is a medical emergency. It can develop quickly, but it is temporary if treated promptly. Your lung capacity, or how much air your lungs can hold, may improve with time, exercise, and treatment. °What are the causes? °There are many possible causes of acute respiratory failure, including: °· Lung injury. °· Chest injury or damage to the ribs or tissues near the lungs. °· Lung conditions that affect the flow of air and blood into and out of the lungs, such as pneumonia, acute respiratory distress syndrome, and cystic fibrosis. °· Medical conditions, such as strokes or spinal cord injuries, that affect the muscles and nerves that control breathing. °· Blood infection (sepsis). °· Inflammation of the pancreas (pancreatitis). °· A blood clot in the lungs (pulmonary embolism). °· A large-volume blood transfusion. °· Burns. °· Near-drowning. °· Seizure. °· Smoke inhalation. °· Reaction to medicines. °· Alcohol or drug overdose. °What increases the risk? °This condition is more likely to develop in people who have: °· A blocked airway. °· Asthma. °· A condition or disease that damages or weakens the muscles, nerves, bones, or tissues that are involved in breathing. °· A serious infection. °· A health problem that blocks the unconscious reflex that is involved in breathing, such as hypothyroidism or sleep apnea. °· A lung injury or trauma. °What are the signs or symptoms? °Trouble breathing is the main symptom of acute respiratory failure. Symptoms may also include: °· Rapid breathing. °· Restlessness or anxiety. °· Skin, lips, or fingernails that appear blue (cyanosis). °· Rapid heart  rate. °· Abnormal heart rhythms (arrhythmias). °· Confusion or changes in behavior. °· Tiredness or loss of energy. °· Feeling sleepy or having a loss of consciousness. °How is this diagnosed? °Your health care provider can diagnose acute respiratory failure with a medical history and physical exam. During the exam, your health care provider will listen to your heart and check for crackling or wheezing sounds in your lungs. Your may also have tests to confirm the diagnosis and determine what is causing respiratory failure. These tests may include: °· Measuring the amount of oxygen in your blood (pulse oximetry). The measurement comes from a small device that is placed on your finger, earlobe, or toe. °· Other blood tests to measure blood gases and to look for signs of infection. °· Sampling your cerebral spinal fluid or tracheal fluid to check for infections. °· Chest X-ray to look for fluid in spaces that should be filled with air. °· Electrocardiogram (ECG) to look at the heart's electrical activity. °How is this treated? °Treatment for this condition usually takes places in a hospital intensive care unit (ICU). Treatment depends on what is causing the condition. It may include one or more treatments until your symptoms improve. Treatment may include: °· Supplemental oxygen. Extra oxygen is given through a tube in the nose, a face mask, or a hood. °· A device such as a continuous positive airway pressure (CPAP) or bi-level positive airway pressure (BiPAP or BPAP) machine. This treatment uses mild air pressure to keep the airways open. A mask or other device will be placed over your nose or mouth. A tube that is connected to a motor will deliver oxygen through the   mask. °· Ventilator. This treatment helps move air into and out of the lungs. This may be done with a bag and mask or a machine. For this treatment, a tube is placed in your windpipe (trachea) so air and oxygen can flow to the lungs. °· Extracorporeal  membrane oxygenation (ECMO). This treatment temporarily takes over the function of the heart and lungs, supplying oxygen and removing carbon dioxide. ECMO gives the lungs a chance to recover. It may be used if a ventilator is not effective. °· Tracheostomy. This is a procedure that creates a hole in the neck to insert a breathing tube. °· Receiving fluids and medicines. °· Rocking the bed to help breathing. °Follow these instructions at home: °· Take over-the-counter and prescription medicines only as told by your health care provider. °· Return to normal activities as told by your health care provider. Ask your health care provider what activities are safe for you. °· Keep all follow-up visits as told by your health care provider. This is important. °How is this prevented? °Treating infections and medical conditions that may lead to acute respiratory failure can help prevent the condition from developing. °Contact a health care provider if: °· You have a fever. °· Your symptoms Rhonda Lynch not improve or they get worse. °Get help right away if: °· You are having trouble breathing. °· You lose consciousness. °· Your have cyanosis or turn blue. °· You develop a rapid heart rate. °· You are confused. °These symptoms may represent a serious problem that is an emergency. Rhonda Lynch not wait to see if the symptoms will go away. Get medical help right away. Call your local emergency services (911 in the [place] Rhonda Lynch not drive yourself to the hospital. °This information is not intended to replace advice given to you by your health care provider. Make sure you discuss any questions you have with your health care provider. °Document Released: 09/01/2013 Document Revised: 03/24/2016 Document Reviewed: 03/14/2016 °Elsevier Interactive Patient Education © 2019 [employer] ° °

## 2019-02-08 LAB — CULTURE, BLOOD (ROUTINE X 2)
Culture: NO GROWTH
Culture: NO GROWTH
Special Requests: ADEQUATE
Special Requests: ADEQUATE

## 2019-02-10 ENCOUNTER — Encounter (HOSPITAL_COMMUNITY): Payer: Self-pay | Admitting: Interventional Radiology

## 2019-02-18 ENCOUNTER — Inpatient Hospital Stay (HOSPITAL_COMMUNITY)
Admission: EM | Admit: 2019-02-18 | Discharge: 2019-02-21 | DRG: 628 | Disposition: A | Discharge status: Home / Self Care | Payer: Medicare Other | Attending: Family Medicine | Admitting: Family Medicine

## 2019-02-18 ENCOUNTER — Encounter (HOSPITAL_COMMUNITY): Payer: Self-pay | Admitting: Emergency Medicine

## 2019-02-18 ENCOUNTER — Emergency Department (HOSPITAL_COMMUNITY): Payer: Medicare Other

## 2019-02-18 ENCOUNTER — Other Ambulatory Visit: Payer: Self-pay

## 2019-02-18 DIAGNOSIS — N2581 Secondary hyperparathyroidism of renal origin: Secondary | ICD-10-CM | POA: Diagnosis present

## 2019-02-18 DIAGNOSIS — I132 Hypertensive heart and chronic kidney disease with heart failure and with stage 5 chronic kidney disease, or end stage renal disease: Secondary | ICD-10-CM | POA: Diagnosis present

## 2019-02-18 DIAGNOSIS — D631 Anemia in chronic kidney disease: Secondary | ICD-10-CM | POA: Diagnosis present

## 2019-02-18 DIAGNOSIS — B182 Chronic viral hepatitis C: Secondary | ICD-10-CM | POA: Diagnosis present

## 2019-02-18 DIAGNOSIS — R509 Fever, unspecified: Secondary | ICD-10-CM

## 2019-02-18 DIAGNOSIS — Z79899 Other long term (current) drug therapy: Secondary | ICD-10-CM

## 2019-02-18 DIAGNOSIS — K529 Noninfective gastroenteritis and colitis, unspecified: Secondary | ICD-10-CM | POA: Diagnosis present

## 2019-02-18 DIAGNOSIS — Z888 Allergy status to other drugs, medicaments and biological substances status: Secondary | ICD-10-CM

## 2019-02-18 DIAGNOSIS — Z20828 Contact with and (suspected) exposure to other viral communicable diseases: Secondary | ICD-10-CM | POA: Diagnosis present

## 2019-02-18 DIAGNOSIS — R0602 Shortness of breath: Secondary | ICD-10-CM

## 2019-02-18 DIAGNOSIS — N186 End stage renal disease: Secondary | ICD-10-CM

## 2019-02-18 DIAGNOSIS — R072 Precordial pain: Secondary | ICD-10-CM | POA: Diagnosis not present

## 2019-02-18 DIAGNOSIS — J810 Acute pulmonary edema: Secondary | ICD-10-CM | POA: Insufficient documentation

## 2019-02-18 DIAGNOSIS — R6510 Systemic inflammatory response syndrome (SIRS) of non-infectious origin without acute organ dysfunction: Secondary | ICD-10-CM | POA: Diagnosis not present

## 2019-02-18 DIAGNOSIS — Z992 Dependence on renal dialysis: Secondary | ICD-10-CM

## 2019-02-18 DIAGNOSIS — Z9981 Dependence on supplemental oxygen: Secondary | ICD-10-CM

## 2019-02-18 DIAGNOSIS — D509 Iron deficiency anemia, unspecified: Secondary | ICD-10-CM | POA: Diagnosis present

## 2019-02-18 DIAGNOSIS — F17210 Nicotine dependence, cigarettes, uncomplicated: Secondary | ICD-10-CM | POA: Diagnosis present

## 2019-02-18 DIAGNOSIS — Z9119 Patient's noncompliance with other medical treatment and regimen: Secondary | ICD-10-CM

## 2019-02-18 DIAGNOSIS — E8779 Other fluid overload: Secondary | ICD-10-CM | POA: Diagnosis not present

## 2019-02-18 DIAGNOSIS — Z7951 Long term (current) use of inhaled steroids: Secondary | ICD-10-CM

## 2019-02-18 DIAGNOSIS — F1410 Cocaine abuse, uncomplicated: Secondary | ICD-10-CM | POA: Diagnosis present

## 2019-02-18 DIAGNOSIS — K219 Gastro-esophageal reflux disease without esophagitis: Secondary | ICD-10-CM | POA: Diagnosis present

## 2019-02-18 DIAGNOSIS — IMO0001 Reserved for inherently not codable concepts without codable children: Secondary | ICD-10-CM

## 2019-02-18 DIAGNOSIS — E8889 Other specified metabolic disorders: Secondary | ICD-10-CM | POA: Diagnosis present

## 2019-02-18 DIAGNOSIS — T8249XA Other complication of vascular dialysis catheter, initial encounter: Secondary | ICD-10-CM

## 2019-02-18 DIAGNOSIS — Z794 Long term (current) use of insulin: Secondary | ICD-10-CM

## 2019-02-18 DIAGNOSIS — E1122 Type 2 diabetes mellitus with diabetic chronic kidney disease: Secondary | ICD-10-CM | POA: Diagnosis present

## 2019-02-18 DIAGNOSIS — Z7982 Long term (current) use of aspirin: Secondary | ICD-10-CM

## 2019-02-18 DIAGNOSIS — I5023 Acute on chronic systolic (congestive) heart failure: Secondary | ICD-10-CM | POA: Diagnosis present

## 2019-02-18 DIAGNOSIS — T82868A Thrombosis of vascular prosthetic devices, implants and grafts, initial encounter: Secondary | ICD-10-CM | POA: Diagnosis not present

## 2019-02-18 DIAGNOSIS — Z8673 Personal history of transient ischemic attack (TIA), and cerebral infarction without residual deficits: Secondary | ICD-10-CM

## 2019-02-18 DIAGNOSIS — E875 Hyperkalemia: Secondary | ICD-10-CM | POA: Diagnosis present

## 2019-02-18 DIAGNOSIS — Y832 Surgical operation with anastomosis, bypass or graft as the cause of abnormal reaction of the patient, or of later complication, without mention of misadventure at the time of the procedure: Secondary | ICD-10-CM | POA: Diagnosis not present

## 2019-02-18 DIAGNOSIS — Z886 Allergy status to analgesic agent status: Secondary | ICD-10-CM

## 2019-02-18 DIAGNOSIS — Z86711 Personal history of pulmonary embolism: Secondary | ICD-10-CM

## 2019-02-18 LAB — COMPREHENSIVE METABOLIC PANEL
ALT: 20 U/L (ref 0–44)
AST: 75 U/L — ABNORMAL HIGH (ref 15–41)
Albumin: 2.1 g/dL — ABNORMAL LOW (ref 3.5–5.0)
Alkaline Phosphatase: 102 U/L (ref 38–126)
Anion gap: 10 (ref 5–15)
BUN: 24 mg/dL — ABNORMAL HIGH (ref 8–23)
CO2: 25 mmol/L (ref 22–32)
Calcium: 8.8 mg/dL — ABNORMAL LOW (ref 8.9–10.3)
Chloride: 99 mmol/L (ref 98–111)
Creatinine, Ser: 7.26 mg/dL — ABNORMAL HIGH (ref 0.44–1.00)
GFR calc Af Amer: 6 mL/min — ABNORMAL LOW (ref >60)
GFR calc non Af Amer: 5 mL/min — ABNORMAL LOW (ref >60)
Glucose, Bld: 99 mg/dL (ref 70–99)
Potassium: 6.1 mmol/L — ABNORMAL HIGH (ref 3.5–5.1)
Sodium: 134 mmol/L — ABNORMAL LOW (ref 135–145)
Total Bilirubin: 1.5 mg/dL — ABNORMAL HIGH (ref 0.3–1.2)
Total Protein: 7 g/dL (ref 6.5–8.1)

## 2019-02-18 LAB — CBC WITH DIFFERENTIAL/PLATELET
Abs Immature Granulocytes: 0 10*3/uL (ref 0.00–0.07)
Basophils Absolute: 0.1 10*3/uL (ref 0.0–0.1)
Basophils Relative: 1 %
Eosinophils Absolute: 1 10*3/uL — ABNORMAL HIGH (ref 0.0–0.5)
Eosinophils Relative: 7 %
HCT: 27 % — ABNORMAL LOW (ref 36.0–46.0)
Hemoglobin: 8.2 g/dL — ABNORMAL LOW (ref 12.0–15.0)
Lymphocytes Relative: 9 %
Lymphs Abs: 1.2 10*3/uL (ref 0.7–4.0)
MCH: 22.8 pg — ABNORMAL LOW (ref 26.0–34.0)
MCHC: 30.4 g/dL (ref 30.0–36.0)
MCV: 75 fL — ABNORMAL LOW (ref 80.0–100.0)
Monocytes Absolute: 1.6 10*3/uL — ABNORMAL HIGH (ref 0.1–1.0)
Monocytes Relative: 12 %
Neutro Abs: 9.7 10*3/uL — ABNORMAL HIGH (ref 1.7–7.7)
Neutrophils Relative %: 71 %
Platelets: 287 10*3/uL (ref 150–400)
RBC: 3.6 MIL/uL — ABNORMAL LOW (ref 3.87–5.11)
RDW: 24.1 % — ABNORMAL HIGH (ref 11.5–15.5)
WBC: 13.6 10*3/uL — ABNORMAL HIGH (ref 4.0–10.5)
nRBC: 0.1 % (ref 0.0–0.2)

## 2019-02-18 LAB — CBG MONITORING, ED: Glucose-Capillary: 108 mg/dL — ABNORMAL HIGH (ref 70–99)

## 2019-02-18 LAB — SARS CORONAVIRUS 2: SARS Coronavirus 2: NOT DETECTED

## 2019-02-18 LAB — LACTIC ACID, PLASMA: Lactic Acid, Venous: 1.5 mmol/L (ref 0.5–1.9)

## 2019-02-18 LAB — GLUCOSE, CAPILLARY: Glucose-Capillary: 109 mg/dL — ABNORMAL HIGH (ref 70–99)

## 2019-02-18 LAB — TROPONIN I: Troponin I: 0.04 ng/mL (ref <0.03)

## 2019-02-18 MED ORDER — GABAPENTIN 100 MG PO CAPS
100.0000 mg | ORAL_CAPSULE | Freq: Three times a day (TID) | ORAL | Status: DC
  Administered 2019-02-18 – 2019-02-21 (×7): 100 mg via ORAL
  Filled 2019-02-18 (×8): qty 1

## 2019-02-18 MED ORDER — VANCOMYCIN HCL IN DEXTROSE 1-5 GM/200ML-% IV SOLN
1000.0000 mg | Freq: Once | INTRAVENOUS | Status: AC
  Administered 2019-02-18: 1000 mg via INTRAVENOUS
  Filled 2019-02-18: qty 200

## 2019-02-18 MED ORDER — OXYCODONE HCL 5 MG PO TABS
5.0000 mg | ORAL_TABLET | Freq: Four times a day (QID) | ORAL | Status: DC | PRN
  Administered 2019-02-19 – 2019-02-21 (×6): 5 mg via ORAL
  Filled 2019-02-18 (×6): qty 1

## 2019-02-18 MED ORDER — METRONIDAZOLE IN NACL 5-0.79 MG/ML-% IV SOLN
500.0000 mg | Freq: Once | INTRAVENOUS | Status: AC
  Administered 2019-02-18: 500 mg via INTRAVENOUS
  Filled 2019-02-18: qty 100

## 2019-02-18 MED ORDER — SODIUM CHLORIDE 0.9 % IV SOLN: 1.0000 g | INTRAVENOUS | Status: DC

## 2019-02-18 MED ORDER — CALCITRIOL 0.25 MCG PO CAPS: 0.2500 ug | ORAL_CAPSULE | ORAL | Status: DC

## 2019-02-18 MED ORDER — AMLODIPINE BESYLATE 5 MG PO TABS
5.0000 mg | ORAL_TABLET | Freq: Every day | ORAL | Status: DC
  Administered 2019-02-18 – 2019-02-20 (×3): 5 mg via ORAL
  Filled 2019-02-18 (×3): qty 1

## 2019-02-18 MED ORDER — ONDANSETRON HCL 4 MG/2ML IJ SOLN
4.0000 mg | Freq: Four times a day (QID) | INTRAMUSCULAR | Status: DC | PRN
  Administered 2019-02-19: 4 mg via INTRAVENOUS
  Filled 2019-02-18: qty 2

## 2019-02-18 MED ORDER — INSULIN GLARGINE 100 UNIT/ML ~~LOC~~ SOLN
5.0000 [IU] | Freq: Every day | SUBCUTANEOUS | Status: DC
  Administered 2019-02-18 – 2019-02-20 (×3): 5 [IU] via SUBCUTANEOUS
  Filled 2019-02-18 (×4): qty 0.05

## 2019-02-18 MED ORDER — SODIUM ZIRCONIUM CYCLOSILICATE 10 G PO PACK
10.0000 g | PACK | Freq: Once | ORAL | Status: AC
  Administered 2019-02-18: 10 g via ORAL
  Filled 2019-02-18: qty 1

## 2019-02-18 MED ORDER — PANTOPRAZOLE SODIUM 40 MG PO TBEC
40.0000 mg | DELAYED_RELEASE_TABLET | Freq: Every day | ORAL | Status: DC
  Administered 2019-02-19 – 2019-02-21 (×3): 40 mg via ORAL
  Filled 2019-02-18 (×3): qty 1

## 2019-02-18 MED ORDER — RENA-VITE PO TABS
1.0000 | ORAL_TABLET | Freq: Every day | ORAL | Status: DC
  Administered 2019-02-18 – 2019-02-20 (×3): 1 via ORAL
  Filled 2019-02-18 (×3): qty 1

## 2019-02-18 MED ORDER — SEVELAMER CARBONATE 800 MG PO TABS: 1600.0000 mg | ORAL_TABLET | Freq: Three times a day (TID) | ORAL | Status: DC

## 2019-02-18 MED ORDER — HYDROMORPHONE HCL 1 MG/ML IJ SOLN: 0.5000 mg | Freq: Once | INTRAMUSCULAR | Status: DC

## 2019-02-18 MED ORDER — HYDROMORPHONE HCL 1 MG/ML IJ SOLN
1.0000 mg | Freq: Once | INTRAMUSCULAR | Status: AC
  Administered 2019-02-18: 1 mg via INTRAMUSCULAR
  Filled 2019-02-18: qty 1

## 2019-02-18 MED ORDER — HYDROMORPHONE HCL 1 MG/ML IJ SOLN
0.5000 mg | Freq: Once | INTRAMUSCULAR | Status: AC
  Administered 2019-02-18: 0.5 mg via INTRAVENOUS
  Filled 2019-02-18: qty 1

## 2019-02-18 MED ORDER — DIALYVITE 800-ZINC 15 0.8 MG PO TABS: 1.0000 | ORAL_TABLET | Freq: Every day | ORAL | Status: DC

## 2019-02-18 MED ORDER — HYDROMORPHONE HCL 1 MG/ML IJ SOLN
1.0000 mg | Freq: Once | INTRAMUSCULAR | Status: AC
  Administered 2019-02-18: 1 mg via INTRAVENOUS
  Filled 2019-02-18: qty 1

## 2019-02-18 MED ORDER — ASPIRIN EC 81 MG PO TBEC
81.0000 mg | DELAYED_RELEASE_TABLET | Freq: Every day | ORAL | Status: DC
  Administered 2019-02-19 – 2019-02-21 (×3): 81 mg via ORAL
  Filled 2019-02-18 (×3): qty 1

## 2019-02-18 MED ORDER — HEPARIN SODIUM (PORCINE) 5000 UNIT/ML IJ SOLN
5000.0000 [IU] | Freq: Three times a day (TID) | INTRAMUSCULAR | Status: DC
  Administered 2019-02-18 – 2019-02-21 (×6): 5000 [IU] via SUBCUTANEOUS
  Filled 2019-02-18 (×5): qty 1

## 2019-02-18 MED ORDER — SODIUM CHLORIDE 0.9 % IV SOLN
2.0000 g | Freq: Once | INTRAVENOUS | Status: AC
  Administered 2019-02-18: 2 g via INTRAVENOUS
  Filled 2019-02-18: qty 2

## 2019-02-18 MED ORDER — INSULIN ASPART 100 UNIT/ML ~~LOC~~ SOLN: 0.0000 [IU] | Freq: Three times a day (TID) | SUBCUTANEOUS | Status: DC

## 2019-02-18 MED ORDER — ONDANSETRON HCL 4 MG PO TABS: 4.0000 mg | ORAL_TABLET | Freq: Four times a day (QID) | ORAL | Status: DC | PRN

## 2019-02-18 MED ORDER — SODIUM CHLORIDE 0.9% FLUSH
3.0000 mL | Freq: Two times a day (BID) | INTRAVENOUS | Status: DC
  Administered 2019-02-18 – 2019-02-20 (×3): 3 mL via INTRAVENOUS

## 2019-02-18 MED ORDER — ONDANSETRON HCL 4 MG/2ML IJ SOLN
4.0000 mg | Freq: Once | INTRAMUSCULAR | Status: AC
  Administered 2019-02-18: 4 mg via INTRAVENOUS
  Filled 2019-02-18: qty 2

## 2019-02-18 NOTE — Progress Notes (Signed)
Notified provider of need to order antibiotics.   

## 2019-02-18 NOTE — Progress Notes (Signed)
VAST consulted to place IV. Called unit to check for need as EJ is noted as having been placed. Unit RN, Burman Nieves stated no further access needed at this time.

## 2019-02-18 NOTE — ED Notes (Signed)
Pharmacy notified to send pt.'s [facility] .

## 2019-02-18 NOTE — Progress Notes (Signed)
Pharmacy Antibiotic Note  Rhonda Lynch is a 63 y.o. female admitted on 02/18/2019 with sepsis of unknown source. PMH significant for ESRD on HD. LA 1.5, WBC 13.6, Tmax 100.6, slightly tachycardic. Had HD 6/9 but did not receive full treatment, HD schedule is TTSat. Pharmacy has been consulted for vancomycin and cefepime dosing.  Plan: Vancomycin 1000mg  x1 loading dose Will plan to dose 500mg  qHD Cefepime 2g x1, followed by 1g q24hrs Monitor HD schedule for further vancomycin dosing Monitor culture data, LOT, vanc randoms as indicated  Height: 5\' 1"  (154.9 cm) Weight: 117 lb 8.1 oz (53.3 kg) IBW/kg (Calculated) : 47.8  Temp (24hrs), Avg:99.5 F (37.5 C), Min:98.3 F (36.8 C), Max:100.6 F (38.1 C)  Recent Labs  Lab 02/18/19 1213 02/18/19 1244  WBC  --  13.6*  LATICACIDVEN 1.5  --     Estimated Creatinine Clearance: 5.8 mL/min (A) (by C-G formula based on SCr of 7.36 mg/dL (H)).    Allergies  Allergen Reactions  . Acetaminophen Nausea And Vomiting    Patient states Acetaminophen and Acetaminophen containing products make her nauseated. She demonstrated this 06/21/18 with nausea followed by emesis.  Marland Kitchen Hydroxyzine Itching    Antimicrobials this admission: Vancomycin 6/10 >> Cefepime 6/10 >>  Thank you for involving pharmacy in this patient's care.  Janae Bridgeman, PharmD PGY1 Pharmacy Resident Phone: [phone] 02/18/2019 1:48 PM

## 2019-02-18 NOTE — ED Notes (Signed)
Admitting MD at bedside evaluating pt.

## 2019-02-18 NOTE — ED Notes (Signed)
ED TO INPATIENT HANDOFF REPORT  ED Nurse Name and Phone #:  Clydene Laming [phone]  S Name/Age/Gender Rhonda Lynch 63 y.o. female Room/Bed: 015C/015C  Code Status   Code Status: Prior  Home/SNF/Other : HOME Oriented x4 Is this baseline? YES   Triage Complete: Triage complete  Chief Complaint Dialysis with SOB  Triage Note Pt arrives via EMS from home with reports of CP and SOB since yesterday. Pt has last HD treatment yesterday but did not get full treatment. HD TThSat. States it is central CP when she coughs.    Allergies Allergies  Allergen Reactions  . Acetaminophen Nausea And Vomiting    Patient states Acetaminophen and Acetaminophen containing products make her nauseated. She demonstrated this 06/21/18 with nausea followed by emesis.  Marland Kitchen Hydroxyzine Itching    Level of Care/Admitting Diagnosis ED Disposition    ED Disposition Condition [facility] Area: [place] [[id number]]  Level of Care: Telemetry Medical [104]  I expect the patient will be discharged within 24 hours: No (not a candidate for 5C-Observation unit)  Covid Evaluation: Confirmed COVID Negative  Diagnosis: Hyperkalemia [366294]  Admitting Physician: Lenore Cordia [[id number]]  Attending Physician: Lenore Cordia [[id number]]  PT Class (Do Not Modify): Observation [104]  PT Acc Code (Do Not Modify): Observation [10022]       B Medical/Surgery History Past Medical History:  Diagnosis Date  . Diabetes mellitus without complication ([place])   . ESRD (end stage renal disease) on dialysis ([place])    "TTS; [facility]; they're moving me to another one" (09/22/2018)  . Hepatitis C    diagnosed 09/2018  . Hypertension   . Oxygen deficiency    2 L at night  . Stroke ([place])   . Substance abuse ([place])    has been clean for 4 months    Past Surgical History:  Procedure Laterality Date  . AV FISTULA PLACEMENT Left 03/05/2018   Procedure: ARTERIOVENOUS (AV) FISTULA CREATION   LEFT UPPER EXTREMITY;  Surgeon: [name], MD;  Location: [place];  Service: Vascular;  Laterality: Left;  . AV FISTULA PLACEMENT Left 07/21/2018   Procedure: ARTERIOVENOUS (AV) FISTULA CREATION;  Surgeon: Marty Heck, MD;  Location: [place];  Service: Vascular;  Laterality: Left;  . AV FISTULA PLACEMENT Left 10/27/2018   Procedure: INSERTION OF ARTERIOVENOUS (AV) GORE-TEX GRAFT UPPER ARM;  Surgeon: Marty Heck, MD;  Location: [place];  Service: Vascular;  Laterality: Left;  . AV FISTULA PLACEMENT Right 01/19/2019   Procedure: INSERTION OF ARTERIOVENOUS GRAFT RIGHT ARM;  Surgeon: [name], MD;  Location: [place];  Service: Vascular;  Laterality: Right;  . BASCILIC VEIN TRANSPOSITION Left 09/29/2018   Procedure: BASILIC VEIN TRANSPOSITION SECOND STAGE LEFT UPPER ARM;  Surgeon: Marty Heck, MD;  Location: [facility];  Service: Vascular;  Laterality: Left;  . HEMATOMA EVACUATION Left 03/06/2018   Procedure: EVACUATION HEMATOMA LEFT ARM;  Surgeon: Angelia Mould, MD;  Location: [facility];  Service: Vascular;  Laterality: Left;  . I&D EXTREMITY Left 10/06/2018   Procedure: IRRIGATION AND DEBRIDEMENT ARM;  Surgeon: Marty Heck, MD;  Location: [facility];  Service: Vascular;  Laterality: Left;  . INSERTION OF DIALYSIS CATHETER Left 04/29/2018   Procedure: INSERTION OF DIALYSIS CATHETER LEFT INTERNAL JUGULAR;  Surgeon: Angelia Mould, MD;  Location: [place];  Service: Vascular;  Laterality: Left;  . INSERTION OF DIALYSIS CATHETER Right 01/19/2019   Procedure: INSERTION OF HEMO  DIALYSIS  PALINDROME TUNNELED CATHETER;  Surgeon: [name], MD;  [facility];  Service: Vascular;  Laterality: Right;  . IR FLUORO GUIDE CV LINE RIGHT  02/18/2018  . IR FLUORO GUIDE CV LINE RIGHT  02/26/2018  . IR FLUORO GUIDE CV LINE RIGHT  01/14/2019  . IR REMOVAL TUN CV CATH W/O FL  04/27/2018  . IR US GUIDE VASC ACCESS RIGHT  02/18/2018  . IR US GUIDE VASC ACCESS RIGHT  01/14/2019  . TEE  WITHOUT CARDIOVERSION N/A 04/29/2018   Procedure: TRANSESOPHAGEAL ECHOCARDIOGRAM (TEE);  Surgeon: Sueanne Margarita, MD;  Location: [facility] ENDOSCOPY;  Service: Cardiovascular;  Laterality: N/A;  . THROMBECTOMY AND REVISION OF ARTERIOVENTOUS (AV) GORETEX  GRAFT Left 12/11/2018   Procedure: THROMBECTOMY AND  REVISION  ARTERIOVENTOUS GORETEX  GRAFT LEFT ARM;  Surgeon: [name], MD;  Location: [place];  Service: Vascular;  Laterality: Left;  Marland Kitchen VENOGRAM Left 10/27/2018   Procedure: LEFT CENTRAL VENOGRAM;  Surgeon: Marty Heck, MD;  Location: [facility];  Service: Vascular;  Laterality: Left;     A IV Location/Drains/Wounds Patient Lines/Drains/Airways Status   Active Line/Drains/Airways    Name:   Placement date:   Placement time:   Site:   Days:   Peripheral IV 02/18/19 Right External jugular   02/18/19    1752    External jugular   less than 1   Fistula / Graft Left Upper arm Arteriovenous vein graft   12/11/18    1107    Upper arm   69   Fistula / Graft Right Forearm Arteriovenous vein graft   01/19/19    0931    Forearm   30   Hemodialysis Catheter Right Subclavian Double-lumen   01/19/19    0842    Subclavian   30   Incision (Closed) 12/11/18 Arm Left   12/11/18    1035     69   Incision (Closed) 01/19/19 Arm Right   01/19/19    0939     30          Intake/Output Last 24 hours  Intake/Output Summary (Last 24 hours) at 02/18/2019 1957 Last data filed at 02/18/2019 1905 Gross per 24 hour  Intake 400 ml  Output -  Net 400 ml    Labs/Imaging Results for orders placed or performed during the hospital encounter of 02/18/19 (from the past 48 hour(s))  Lactic acid, plasma     Status: None   Collection Time: 02/18/19 12:13 PM  Result Value Ref Range   Lactic Acid, Venous 1.5 0.5 - 1.9 mmol/L    Comment: Performed at [facility], [address], [place], [place] [address]  Troponin I - ONCE - STAT     Status: Abnormal   Collection Time: 02/18/19 12:30 PM  Result Value Ref Range    Troponin I 0.04 (HH) <0.03 ng/mL    Comment: CRITICAL RESULT CALLED TO, READ BACK BY AND VERIFIED WITH: MAGGIE BARBER,RN AT 8299 02/18/2019 BY ZBEECH. Performed at [facility], [place] [address], [place], [place] [address]   CBC WITH DIFFERENTIAL     Status: Abnormal   Collection Time: 02/18/19 12:44 PM  Result Value Ref Range   WBC 13.6 (H) 4.0 - 10.5 K/uL   RBC 3.60 (L) 3.87 - 5.11 MIL/uL   Hemoglobin 8.2 (L) 12.0 - 15.0 g/dL    Comment: Reticulocyte Hemoglobin testing may be clinically indicated, consider ordering this additional test [id number]    HCT  27.0 (L) 36.0 - 46.0 %   MCV 75.0 (L) 80.0 - 100.0 fL   MCH 22.8 (L) 26.0 - 34.0 pg   MCHC 30.4 30.0 - 36.0 g/dL   RDW 24.1 (H) 11.5 - 15.5 %   Platelets 287 150 - 400 K/uL   nRBC 0.1 0.0 - 0.2 %   Neutrophils Relative % 71 %   Neutro Abs 9.7 (H) 1.7 - 7.7 K/uL   Lymphocytes Relative 9 %   Lymphs Abs 1.2 0.7 - 4.0 K/uL   Monocytes Relative 12 %   Monocytes Absolute 1.6 (H) 0.1 - 1.0 K/uL   Eosinophils Relative 7 %   Eosinophils Absolute 1.0 (H) 0.0 - 0.5 K/uL   Basophils Relative 1 %   Basophils Absolute 0.1 0.0 - 0.1 K/uL   Abs Immature Granulocytes 0.00 0.00 - 0.07 K/uL   Polychromasia PRESENT    Target Cells PRESENT     Comment: Performed at [facility], [address] [place], [place] [address]  SARS Coronavirus 2     Status: None   Collection Time: 02/18/19 12:44 PM  Result Value Ref Range   SARS Coronavirus 2 NOT DETECTED NOT DETECTED    Comment: (NOTE) SARS-CoV-2 target nucleic acids are NOT DETECTED. The SARS-CoV-2 RNA is generally detectable in upper and lower respiratory specimens during the acute phase of infection.  Negative  results do not preclude SARS-CoV-2 infection, do not rule out co-infections with other pathogens, and should not be used as the sole basis for treatment or other patient management decisions.  Negative results must be combined with clinical observations, patient history,  and epidemiological information. The expected result is Not Detected. Fact Sheet for Patients: http://www.biofiredefense.com/wp-content/uploads/2020/03/BIOFIRE-COVID -19-patients.pdf Fact Sheet for Healthcare Providers: http://www.biofiredefense.com/wp-content/uploads/2020/03/BIOFIRE-COVID -19-hcp.pdf This test is not yet approved or cleared by the [place] and  has been authorized for detection and/or diagnosis of SARS-CoV-2 by FDA under an Emergency Use Authorization (EUA).  This EUA will remain in effec t (meaning this test can be used) for the duration of  the COVID-19 declaration under Section 564(b)(1) of the Act, 21 U.S.C. section 360bbb-3(b)(1), unless the authorization is terminated or revoked sooner. Performed at [facility], [place] [address], [place], [place] [address]   Comprehensive metabolic panel     Status: Abnormal   Collection Time: 02/18/19  5:24 PM  Result Value Ref Range   Sodium 134 (L) 135 - 145 mmol/L   Potassium 6.1 (H) 3.5 - 5.1 mmol/L    Comment: HEMOLYSIS AT THIS LEVEL MAY AFFECT RESULT   Chloride 99 98 - 111 mmol/L   CO2 25 22 - 32 mmol/L   Glucose, Bld 99 70 - 99 mg/dL   BUN 24 (H) 8 - 23 mg/dL   Creatinine, Ser 7.26 (H) 0.44 - 1.00 mg/dL   Calcium 8.8 (L) 8.9 - 10.3 mg/dL   Total Protein 7.0 6.5 - 8.1 g/dL   Albumin 2.1 (L) 3.5 - 5.0 g/dL   AST 75 (H) 15 - 41 U/L   ALT 20 0 - 44 U/L   Alkaline Phosphatase 102 38 - 126 U/L   Total Bilirubin 1.5 (H) 0.3 - 1.2 mg/dL   GFR calc non Af Amer 5 (L) >60 mL/min   GFR calc Af Amer 6 (L) >60 mL/min   Anion gap 10 5 - 15    Comment: Performed at [facility], [place] [address] [place], [place] [address]   Dg Chest [place] [address]  Result Date: 02/18/2019 CLINICAL DATA:  Chest pain and shortness of breath EXAM: PORTABLE CHEST 1 VIEW COMPARISON:  02/05/2019 FINDINGS: Cardiac shadow remains enlarged. Aortic calcifications are again seen. Dialysis catheter is again noted on the right. Diffuse  alveolar edema is noted similar to that seen on the prior exam. Some more focal scarring in right base is noted. No bony abnormality is noted. IMPRESSION: Changes consistent with CHF similar to that seen on the prior plain film. Electronically Signed   By: Inez Catalina M.D.   On: 02/18/2019 13:30    Pending Labs Unresulted Labs (From admission, onward)    Start     Ordered   02/18/19 1214  Urine culture  ONCE - STAT,   STAT    Question:  Patient immune status  Answer:  Normal   02/18/19 1214   02/18/19 1213  Blood Culture (routine x 2)  BLOOD CULTURE X 2,   STAT    Question:  Patient immune status  Answer:  Normal   02/18/19 1214   02/18/19 1213  Urinalysis, Routine w reflex microscopic  ONCE - STAT,   STAT     02/18/19 1214          Vitals/Pain Today's Vitals   02/18/19 1749 02/18/19 1840 02/18/19 1926 02/18/19 1927  BP: (!) 160/92  (!) 166/115   Pulse: (!) 108  (!) 118   Resp: (!) 26  20   Temp:      TempSrc:      SpO2: 90%  100%   Weight:      Height:      PainSc: 10-Worst pain ever Asleep 0-No pain 0-No pain    Isolation Precautions Droplet and Contact precautions  Medications Medications  ceFEPIme (MAXIPIME) 1 g in sodium chloride 0.9 % 100 mL IVPB (has no administration in time range)  HYDROmorphone (DILAUDID) injection 0.5 mg (0 mg Intravenous Hold 02/18/19 1559)  sodium zirconium cyclosilicate (LOKELMA) packet 10 g (has no administration in time range)  ondansetron (ZOFRAN) injection 4 mg (4 mg Intravenous Given 02/18/19 1302)  HYDROmorphone (DILAUDID) injection 1 mg (1 mg Intravenous Given 02/18/19 1302)  ceFEPIme (MAXIPIME) 2 g in sodium chloride 0.9 % 100 mL IVPB (0 g Intravenous Stopped 02/18/19 1457)  metroNIDAZOLE (FLAGYL) IVPB 500 mg (0 mg Intravenous Stopped 02/18/19 1649)  vancomycin (VANCOCIN) IVPB 1000 mg/200 mL premix (0 mg Intravenous Stopped 02/18/19 1905)  HYDROmorphone (DILAUDID) injection 0.5 mg (0.5 mg Intravenous Given 02/18/19 1524)  HYDROmorphone  (DILAUDID) injection 1 mg (1 mg Intramuscular Given 02/18/19 1749)    Mobility walks Moderate fall risk   Focused Assessments Alert/oriented   R Recommendations: See Admitting Provider Note  Report given to:   Additional Notes:

## 2019-02-18 NOTE — ED Provider Notes (Signed)
[facility] EMERGENCY DEPARTMENT Provider Note   CSN: [id number] Arrival date & time: 02/18/19  1149    History   Chief Complaint Chief Complaint  Patient presents with  . Chest Pain  . Shortness of Breath    HPI Rhonda Lynch is a 63 y.o. female.     Patient is a 63 year old end-stage renal disease patient on dialysis Tuesday Thursday Saturday, stroke, substance abuse, hepatitis C, hypertension, diabetes,, recent admission in May for healthcare associated pneumonia with negative COVID and history of vasculitis and Boop who is presenting today with chest pain, cough, shortness of breath that started 1 to 2 days ago.  She did go to dialysis yesterday but states she had to stop 45 minutes early because she was feeling generally bad.  She was having chest and abdominal pain at the time.  She states she went home and started feeling worse with worsening pain and then shortness of breath developed overnight.  She had one episode of emesis 2 days ago but is not been consistent.  She has chronic diarrhea but it is not changed.  There is no one in her home that has been ill but she does go to dialysis regularly.  She denies any alcohol use and is taking her medications as prescribed.  Based on past medical records patient has a history of chronic noncompliance and stopping her dialysis early.  She currently states the pain in the center of her chest is an 8 out of 10 and worse with taking a deep breath.  It does not radiate into her back or abdomen.  She denies any recent weight gain or swelling in her legs.  The history is provided by the patient.  Chest Pain  Associated symptoms: shortness of breath   Shortness of Breath  Associated symptoms: chest pain     Past Medical History:  Diagnosis Date  . Diabetes mellitus without complication ([place])   . ESRD (end stage renal disease) on dialysis ([place])    "TTS; [facility]; they're moving me to another one" (09/22/2018)  .  Hepatitis C    diagnosed 09/2018  . Hypertension   . Oxygen deficiency    2 L at night  . Stroke ([place])   . Substance abuse ([place])    has been clean for 4 months    Pain medicine Patient Active Problem List   Diagnosis Date Noted  . Hypoxia   . Acute metabolic encephalopathy   . Acute on chronic systolic CHF (congestive heart failure) ([place]) 01/11/2019  . Fluid overload 01/11/2019  . Diarrhea 12/28/2018  . Metabolic acidosis, increased anion gap (IAG) 12/19/2018  . Volvulus ([place]) 12/19/2018  . Hyperkalemia 11/25/2018  . Cocaine overdose, accidental or unintentional, initial encounter ([place]) 11/25/2018  . CAP (community acquired pneumonia) 11/17/2018  . End-stage renal disease on hemodialysis ([place])   . Hypoglycemia 10/23/2018  . Non-intractable vomiting   . Other ascites   . Palliative care encounter   . Left arm pain   . Chronic hepatitis C virus genotype 1b infection ([place]) 09/28/2018  . ICH (intracerebral hemorrhage) ([place]) small left caudate due to HTN 07/17/2018  . HCAP (healthcare-associated pneumonia) 06/21/2018  . Substance induced mood disorder ([place]) 05/15/2018  . Agitation   . Hemodialysis catheter infection, initial encounter ([place])   . 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 Pulmonary embolism ([place]) 04/27/2018  . Sepsis ([place]) 04/26/2018  . Dyspnea 04/26/2018  . Chest pain 04/26/2018  . Tachycardia 04/26/2018  . Abdominal pain 04/18/2018  .  Insulin dependent diabetes mellitus ([place]) 04/18/2018  . Right flank pain 04/18/2018  . Acute respiratory failure with hypoxia ([place]) 04/12/2018  . SVT (supraventricular tachycardia) ([place])   . Chronic renal failure   . Hypertension   . Noncompliance   . Acute pulmonary edema ([facility])   . Elevated troponin 03/19/2018  . Fever   . 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 Diffuse pulmonary alveolar hemorrhage   . Hypoxemia   . Pulmonary infiltrate   . 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[place])   . ESRD on dialysis ([place]) 02/12/2018  .  Acute respiratory failure ([place]) 02/12/2018  . Alcohol abuse 02/12/2018  . Microcytic anemia 02/12/2018  . Cocaine abuse ([place]) 02/12/2018  . Epigastric pain 02/12/2018  . Pneumonia 02/12/2018  . TOBACCO ABUSE 03/08/2010  . PNEUMONIA 03/08/2010    Past Surgical History:  Procedure Laterality Date  . AV FISTULA PLACEMENT Left 03/05/2018   Procedure: ARTERIOVENOUS (AV) FISTULA CREATION  LEFT UPPER EXTREMITY;  Surgeon: [name], MD;  Location: [facility];  Service: Vascular;  Laterality: Left;  . AV FISTULA PLACEMENT Left 07/21/2018   Procedure: ARTERIOVENOUS (AV) FISTULA CREATION;  Surgeon: Marty Heck, MD;  Location: [facility];  Service: Vascular;  Laterality: Left;  . AV FISTULA PLACEMENT Left 10/27/2018   Procedure: INSERTION OF ARTERIOVENOUS (AV) GORE-TEX GRAFT UPPER ARM;  Surgeon: Marty Heck, MD;  Location: [facility];  Service: Vascular;  Laterality: Left;  . AV FISTULA PLACEMENT Right 01/19/2019   Procedure: INSERTION OF ARTERIOVENOUS GRAFT RIGHT ARM;  Surgeon: [name], MD;  Location: [facility];  Service: Vascular;  Laterality: Right;  . BASCILIC VEIN TRANSPOSITION Left 09/29/2018   Procedure: BASILIC VEIN TRANSPOSITION SECOND STAGE LEFT UPPER ARM;  Surgeon: Marty Heck, MD;  Location: [facility];  Service: Vascular;  Laterality: Left;  . HEMATOMA EVACUATION Left 03/06/2018   Procedure: EVACUATION HEMATOMA LEFT ARM;  Surgeon: Angelia Mould, MD;  Location: [facility];  Service: Vascular;  Laterality: Left;  . I&D EXTREMITY Left 10/06/2018   Procedure: IRRIGATION AND DEBRIDEMENT ARM;  Surgeon: Marty Heck, MD;  Location: [facility];  Service: Vascular;  Laterality: Left;  . INSERTION OF DIALYSIS CATHETER Left 04/29/2018   Procedure: INSERTION OF DIALYSIS CATHETER LEFT INTERNAL JUGULAR;  Surgeon: Angelia Mould, MD;  Location: [facility];  Service: Vascular;  Laterality: Left;  . INSERTION OF DIALYSIS CATHETER Right 01/19/2019   Procedure: INSERTION OF HEMO DIALYSIS   PALINDROME TUNNELED CATHETER;  Surgeon: [name], MD;  [facility]: [facility];  Service: Vascular;  Laterality: Right;  . IR FLUORO GUIDE CV LINE RIGHT  02/18/2018  . IR FLUORO GUIDE CV LINE RIGHT  02/26/2018  . IR FLUORO GUIDE CV LINE RIGHT  01/14/2019  . IR REMOVAL TUN CV CATH W/O FL  04/27/2018  . IR US GUIDE VASC ACCESS RIGHT  02/18/2018  . IR US GUIDE VASC ACCESS RIGHT  01/14/2019  . TEE WITHOUT CARDIOVERSION N/A 04/29/2018   Procedure: TRANSESOPHAGEAL ECHOCARDIOGRAM (TEE);  Surgeon: Sueanne Margarita, MD;  Location[facility];  Service: Cardiovascular;  Laterality: N/A;  . THROMBECTOMY AND REVISION OF ARTERIOVENTOUS (AV) GORETEX  GRAFT Left 12/11/2018   Procedure: THROMBECTOMY AND  REVISION  ARTERIOVENTOUS GORETEX  GRAFT LEFT ARM;  Surgeon: [name], MD;  Location: [facility];  Service: Vascular;  Laterality: Left;  Marland Kitchen VENOGRAM Left 10/27/2018   Procedure: LEFT CENTRAL VENOGRAM;  Surgeon: Marty Heck, MD;  Location: [facility];  Service: Vascular;  Laterality:  Left;     OB History   No obstetric history on file.      Home Medications    Prior to Admission medications   Medication Sig Start Date End Date Taking? Authorizing Provider  amLODipine (NORVASC) 5 MG tablet Take 1 tablet (5 mg total) by mouth at bedtime. 02/07/19   Domenic Polite, MD  aspirin 81 MG EC tablet Take 1 tablet (81 mg total) by mouth daily. 02/07/19   Domenic Polite, MD  B Complex-C-Zn-Folic Acid (DIALYVITE 329-JMEQ 15) 0.8 MG TABS Take 1 tablet by mouth daily. 11/04/18   [provider]  Blood Glucose Monitoring Suppl (ACCU-CHEK AVIVA) device Use as instructed 10/29/18 10/29/19  Isabelle Course, MD  calcitRIOL (ROCALTROL) 0.25 MCG capsule Take 1 capsule (0.25 mcg total) by mouth every Monday, Wednesday, and Friday with hemodialysis. 02/09/19   Domenic Polite, MD  fluticasone [facility]) 50 MCG/ACT nasal spray Place 2 sprays into both nostrils daily.    [provider]  gabapentin (NEURONTIN) 100 MG capsule  Take 1 capsule (100 mg total) by mouth 2 (two) times daily for 30 days. 02/07/19 03/09/19  Domenic Polite, MD  glucose blood (ACCU-CHEK AVIVA) test strip Use twice per day. 10/29/18   Isabelle Course, MD  hydrocortisone cream 1 % Apply 1 application topically 2 (two) times daily. Patient taking differently: Apply 1 application topically 2 (two) times daily as needed for itching.  01/20/19   Pokhrel, Corrie Mckusick, MD  insulin aspart (NOVOLOG) 100 UNIT/ML injection Inject 5 Units into the skin 3 (three) times daily with meals. Patient taking differently: Inject 0-5 Units into the skin 3 (three) times daily with meals.  10/13/18   Alphonzo Grieve, MD  insulin glargine (LANTUS) 100 UNIT/ML injection Inject 0.05 mLs (5 Units total) into the skin at bedtime. 02/07/19   Domenic Polite, MD  Insulin Syringe-Needle U-100 (INSULIN SYRINGE .3CC/31GX5/16") 31G X 5/16" 0.3 ML MISC Inject 1 each into the skin 4 (four) times daily. 06/27/18   Clent Demark, PA-C  Lancets (ACCU-CHEK SOFT [facility]) lancets Use twice per day. 10/29/18   Isabelle Course, MD  lidocaine-prilocaine (EMLA) cream Apply 1 application topically See admin instructions. Apply small amount to access site (AVF)before dialysis. Cover with occlusive dressing (Saran wrap) 12/29/18   [provider]  oxyCODONE (OXY IR/ROXICODONE) 5 MG immediate release tablet Take 1 tablet (5 mg total) by mouth every 6 (six) hours as needed for moderate pain or severe pain. 02/07/19   Domenic Polite, MD  pantoprazole (PROTONIX) 40 MG tablet Take 1 tablet (40 mg total) by mouth daily. 02/07/19   Domenic Polite, MD  polyethylene glycol (MIRALAX / GLYCOLAX) 17 g packet Take 17 g by mouth daily. 02/07/19   Domenic Polite, MD  sevelamer carbonate (RENVELA) 800 MG tablet Take 2 tablets (1,600 mg total) by mouth 3 (three) times daily with meals. 02/07/19   Domenic Polite, MD    Family History Family History  Problem Relation Age of Onset  . Autoimmune disease Neg Hx      Social History Social History   Tobacco Use  . Smoking status: Current Every Day Smoker    Packs/day: 0.50    Years: 48.00    Pack years: 24.00    Types: Cigarettes  . Smokeless tobacco: Never Used  . Tobacco comment: trying to quit ; smoking 1 1/2 cigarettes/day  (09/22/2018)  Substance Use Topics  . Alcohol use: Not Currently    Comment: 09/22/2018 "stopped about 6 months ago"  .  Drug use: Yes    Types: Cocaine, Heroin    Comment: See notes     Allergies   Acetaminophen and Hydroxyzine   Review of Systems Review of Systems  Respiratory: Positive for shortness of breath.   Cardiovascular: Positive for chest pain.  All other systems reviewed and are negative.    Physical Exam Updated Vital Signs BP (!) 166/95   Pulse (!) 104   Temp (!) 100.6 F (38.1 C) (Rectal)   Resp (!) 29   Ht 5\' 1"  (1.549 m)   Wt 53.3 kg   SpO2 91%   BMI 22.20 kg/m   Physical Exam Vitals signs and nursing note reviewed.  Constitutional:      General: She is not in acute distress.    Appearance: She is well-developed.     Comments: Appears uncomfortable  HENT:     Head: Normocephalic and atraumatic.     Mouth/Throat:     Mouth: Mucous membranes are dry.  Eyes:     Extraocular Movements: Extraocular movements intact.     Pupils: Pupils are equal, round, and reactive to light.  Cardiovascular:     Rate and Rhythm: Regular rhythm. Tachycardia present.     Heart sounds: Normal heart sounds. No murmur. No friction rub.  Pulmonary:     Effort: Pulmonary effort is normal.     Breath sounds: Normal breath sounds. No wheezing or rales.     Comments: Right subclavian permcath.  tachypnea Chest:     Chest wall: No tenderness.  Abdominal:     General: Bowel sounds are normal. There is no distension.     Palpations: Abdomen is soft.     Tenderness: There is no abdominal tenderness. There is no guarding or rebound.  Musculoskeletal: Normal range of motion.        General: No tenderness.      Comments: No edema.  Maturing fistula in the right arm  Skin:    General: Skin is warm and dry.     Findings: No rash.  Neurological:     Mental Status: She is alert and oriented to person, place, and time.     Cranial Nerves: No cranial nerve deficit.  Psychiatric:        Behavior: Behavior normal.      ED Treatments / Results  Labs (all labs ordered are listed, but only abnormal results are displayed) Labs Reviewed  CBC WITH DIFFERENTIAL/PLATELET - Abnormal; Notable for the following components:      Result Value   WBC 13.6 (*)    RBC 3.60 (*)    Hemoglobin 8.2 (*)    HCT 27.0 (*)    MCV 75.0 (*)    MCH 22.8 (*)    RDW 24.1 (*)    Neutro Abs 9.7 (*)    Monocytes Absolute 1.6 (*)    Eosinophils Absolute 1.0 (*)    All other components within normal limits  TROPONIN I - Abnormal; Notable for the following components:   Troponin I 0.04 (*)    All other components within normal limits  SARS CORONAVIRUS 2  CULTURE, BLOOD (ROUTINE X 2)  CULTURE, BLOOD (ROUTINE X 2)  URINE CULTURE  LACTIC ACID, PLASMA  URINALYSIS, ROUTINE W REFLEX MICROSCOPIC  COMPREHENSIVE METABOLIC PANEL    EKG EKG Interpretation  Date/Time:  Wednesday February 18 2019 11:52:02 EDT Ventricular Rate:  103 PR Interval:    QRS Duration: 77 QT Interval:  345 QTC Calculation: 452 R Axis:   -38  Text Interpretation:  Sinus tachycardia Left atrial enlargement RSR' in V1 or V2, probably normal variant Left ventricular hypertrophy ST elevation, consider inferior injury No significant change since last tracing Confirmed by Blanchie Dessert [phone]) on 02/18/2019 11:59:17 AM   Radiology Dg Chest Port 1 View  Result Date: 02/18/2019 CLINICAL DATA:  Chest pain and shortness of breath EXAM: PORTABLE CHEST 1 VIEW COMPARISON:  02/05/2019 FINDINGS: Cardiac shadow remains enlarged. Aortic calcifications are again seen. Dialysis catheter is again noted on the right. Diffuse alveolar edema is noted similar to that seen  on the prior exam. Some more focal scarring in right base is noted. No bony abnormality is noted. IMPRESSION: Changes consistent with CHF similar to that seen on the prior plain film. Electronically Signed   By: Inez Catalina M.D.   On: 02/18/2019 13:30    Procedures Procedures (including critical care time)  Medications Ordered in ED Medications  ondansetron (ZOFRAN) injection 4 mg (has no administration in time range)  HYDROmorphone (DILAUDID) injection 1 mg (has no administration in time range)     Initial Impression / Assessment and Plan / ED Course  I have reviewed the triage vital signs and the nursing notes.  Pertinent labs & imaging results that were available during my care of the patient were reviewed by me and considered in my medical decision making (see chart for details).       Pt is a 63y/o female with hx ESRD on dialysis, polysubstance abuse, recent hospitalization at the end of may with BOOP, vasculitis and HCAP/fluid overload presenting with SOB, Chest pain and fever of 100.6 rectally here.  Pt also having some urinary complaints.  Pt's chest pain seems more pleuritic and possible PE but feel more likely infectious.  Sepsis orderset initiated.  Held on fluids as pt states she did stop dialysis early and has hx of same with fluid overload.  Breath sounds are relatively clear but JVD present.  Pt given pain control and on 3L of oxygen at this time.  3:46 PM Leukocytosis of 13 with stable Hb.  CXR shows signs of fluid overload and CMP still pending.  Trop slightly elevated which appears to be leak.  Abx ordered for broad coverage of sepsis but normal lactate and COVID neg.  Do not feel pt needs fluid bolus at this time.  Pt checked out to Dr. Laverta Baltimore at 1500.  GAVIN FAIVRE was evaluated in Emergency Department on 02/18/2019 for the symptoms described in the history of present illness. She was evaluated in the context of the global COVID-19 pandemic, which necessitated  consideration that the patient might be at risk for infection with the SARS-CoV-2 virus that causes COVID-19. Institutional protocols and algorithms that pertain to the evaluation of patients at risk for COVID-19 are in a state of rapid change based on information released by regulatory bodies including the CDC and federal and state organizations. These policies and algorithms were followed during the patient's care in the ED.   Final Clinical Impressions(s) / ED Diagnoses   Final diagnoses:  None    ED Discharge Orders    None       Blanchie Dessert, MD 02/18/19 [phone]

## 2019-02-18 NOTE — H&P (Signed)
History and Physical    [name]:[id number] DOB: May 21, 1956 DOA: 02/18/2019  PCP: Clent Demark, PA-C  Patient coming from: Home  I have personally briefly reviewed patient's old medical records in [place]  Chief Complaint: Shortness of breath  HPI: Rhonda Lynch is a 63 y.o. 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  She did go to her usual dialysis session yesterday but reportedly stopped 45 minutes early due to feeling generally unwell with chest and abdominal discomfort.  She developed shortness of breath overnight.  She reportedly had an episode of emesis 2 days prior which has not been persistent.  She has chronic unchanged diarrhea.  Patient was recently admitted from 02/03/2019-02/07/2019 for acute hypoxic respiratory failure.  She was treated with broad-spectrum antibiotics for presumed pneumonia and dialysis with extra volume removal.  CTA chest showed patchy consolidation and diffuse groundglass changes which appeared chronic from prior study in 2019.  Case was discussed with pulmonology, Dr. Vaughan Browner, who recommended outpatient follow-up with plan for HRCT improved, however does not appear patient was able to follow-up yet.  ED Course:  Initial vitals in the ED showed BP 165/92, pulse 100, RR 30, temp 98.3 Fahrenheit, SPO2 98% on room air.  Repeat rectal temp was 100.6 Fahrenheit.  Labs are notable for WBC 13.6, hemoglobin 8.2, platelets 287,000, potassium 6.1, bicarb 25, BUN 24, creatinine 7.26, troponin I 0.04, lactic acid 1.5.  SARS-CoV-2 test was negative.  Blood cultures were obtained and pending.  Portable chest x-ray showed HD catheter in place on the right side and diffuse pulmonary edema.   Patient was given IV vancomycin, cefepime, and Flagyl due to concern for possible infection.  She was also given IV Dilaudid for pain.  Peripheral IV access was unable to be easily obtained therefore the EDP placed a right EJ Angiocath.  Nephrology were consulted and recommended giving Lokelma for hyperkalemia and plan for dialysis in the a.m.  The hospital service was consulted to admit for further evaluation and management.  Review of Systems: All systems reviewed and are negative except as documented in history of present illness above.   Past Medical History:  Diagnosis Date  . Diabetes mellitus without complication ([place])   . ESRD (end stage renal disease) on dialysis ([place])    "TTS; [facility]; they're moving me to another one" (09/22/2018)  . Hepatitis C    diagnosed 09/2018  . Hypertension   . Oxygen deficiency    2 L at night  . Stroke ([place])   . Substance abuse ([place])    has been clean for 4 months     Past Surgical History:  Procedure Laterality Date  . AV FISTULA PLACEMENT Left 03/05/2018   Procedure: ARTERIOVENOUS (AV) FISTULA CREATION  LEFT UPPER EXTREMITY;  Surgeon: [name], MD;  Location: [facility];  Service: Vascular;  Laterality: Left;  . AV FISTULA PLACEMENT Left 07/21/2018   Procedure: ARTERIOVENOUS (AV) FISTULA CREATION;  Surgeon: Marty Heck, MD;  Location: [facility];  Service: Vascular;  Laterality: Left;  . AV FISTULA PLACEMENT Left 10/27/2018   Procedure: INSERTION OF ARTERIOVENOUS (AV) GORE-TEX GRAFT UPPER ARM;  Surgeon: Marty Heck, MD;  Location: [facility];  Service: Vascular;  Laterality: Left;  . AV FISTULA PLACEMENT Right  01/19/2019   Procedure: INSERTION OF ARTERIOVENOUS GRAFT RIGHT ARM;  Surgeon: [name], MD;  Location: [facility];  Service: Vascular;  Laterality: Right;  . BASCILIC VEIN TRANSPOSITION Left 09/29/2018   Procedure: BASILIC VEIN TRANSPOSITION SECOND STAGE LEFT UPPER ARM;  Surgeon: Marty Heck, MD;  Location: [facility];   Service: Vascular;  Laterality: Left;  . HEMATOMA EVACUATION Left 03/06/2018   Procedure: EVACUATION HEMATOMA LEFT ARM;  Surgeon: Angelia Mould, MD;  Location: [facility];  Service: Vascular;  Laterality: Left;  . I&D EXTREMITY Left 10/06/2018   Procedure: IRRIGATION AND DEBRIDEMENT ARM;  Surgeon: Marty Heck, MD;  Location: [facility];  Service: Vascular;  Laterality: Left;  . INSERTION OF DIALYSIS CATHETER Left 04/29/2018   Procedure: INSERTION OF DIALYSIS CATHETER LEFT INTERNAL JUGULAR;  Surgeon: Angelia Mould, MD;  Location: [facility];  Service: Vascular;  Laterality: Left;  . INSERTION OF DIALYSIS CATHETER Right 01/19/2019   Procedure: INSERTION OF HEMO DIALYSIS  PALINDROME TUNNELED CATHETER;  Surgeon: [name], MD;  Location: [facility];  Service: Vascular;  Laterality: Right;  . IR FLUORO GUIDE CV LINE RIGHT  02/18/2018  . IR FLUORO GUIDE CV LINE RIGHT  02/26/2018  . IR FLUORO GUIDE CV LINE RIGHT  01/14/2019  . IR REMOVAL TUN CV CATH W/O FL  04/27/2018  . IR US GUIDE VASC ACCESS RIGHT  02/18/2018  . IR US GUIDE VASC ACCESS RIGHT  01/14/2019  . TEE WITHOUT CARDIOVERSION N/A 04/29/2018   Procedure: TRANSESOPHAGEAL ECHOCARDIOGRAM (TEE);  Surgeon: Sueanne Margarita, MD;  Location: [facility] ENDOSCOPY;  Service: Cardiovascular;  Laterality: N/A;  . THROMBECTOMY AND REVISION OF ARTERIOVENTOUS (AV) GORETEX  GRAFT Left 12/11/2018   Procedure: THROMBECTOMY AND  REVISION  ARTERIOVENTOUS GORETEX  GRAFT LEFT ARM;  Surgeon: [name], MD;  Location: [facility];  Service: Vascular;  Laterality: Left;  Marland Kitchen VENOGRAM Left 10/27/2018   Procedure: LEFT CENTRAL VENOGRAM;  Surgeon: Marty Heck, MD;  Location: [facility];  Service: Vascular;  Laterality: Left;    Social History:  reports that she has been smoking cigarettes. She has a 24.00 pack-year smoking history. She has never used smokeless tobacco. She reports previous alcohol use. She reports current drug use. Drugs: Cocaine and Heroin.  Allergies  Allergen  Reactions  . Acetaminophen Nausea And Vomiting    Patient states Acetaminophen and Acetaminophen containing products make her nauseated. She demonstrated this 06/21/18 with nausea followed by emesis.  Marland Kitchen Hydroxyzine Itching    Family History  Problem Relation Age of Onset  . Autoimmune disease Neg Hx      Prior to Admission medications   Medication Sig Start Date End Date Taking? Authorizing Provider  amLODipine (NORVASC) 5 MG tablet Take 1 tablet (5 mg total) by mouth at bedtime. 02/07/19  Yes Domenic Polite, MD  aspirin 81 MG EC tablet Take 1 tablet (81 mg total) by mouth daily. 02/07/19  Yes Domenic Polite, MD  B Complex-C-Zn-Folic Acid (DIALYVITE 562-ZHYQ 15) 0.8 MG TABS Take 1 tablet by mouth daily. 11/04/18  Yes [provider]  calcitRIOL (ROCALTROL) 0.25 MCG capsule Take 1 capsule (0.25 mcg total) by mouth every Monday, Wednesday, and Friday with hemodialysis. Patient taking differently: Take 0.25 mcg by mouth every Tuesday, Thursday, and Saturday at 6 PM.  02/09/19  Yes Domenic Polite, MD  fluticasone [facility]) 50 MCG/ACT nasal spray Place 2 sprays into both nostrils as needed for allergies.    Yes [provider]  gabapentin (NEURONTIN) 100  MG capsule Take 1 capsule (100 mg total) by mouth 2 (two) times daily for 30 days. Patient taking differently: Take 100 mg by mouth 3 (three) times daily.  02/07/19 03/09/19 Yes Domenic Polite, MD  hydrocortisone cream 1 % Apply 1 application topically 2 (two) times daily. Patient taking differently: Apply 1 application topically 2 (two) times daily as needed for itching.  01/20/19  Yes Pokhrel, Laxman, MD  insulin aspart (NOVOLOG) 100 UNIT/ML injection Inject 5 Units into the skin 3 (three) times daily with meals. Patient taking differently: Inject 3 Units into the skin 3 (three) times daily with meals.  10/13/18  Yes Alphonzo Grieve, MD  insulin glargine (LANTUS) 100 UNIT/ML injection Inject 0.05 mLs (5 Units total) into the skin  at bedtime. 02/07/19  Yes Domenic Polite, MD  oxyCODONE (OXY IR/ROXICODONE) 5 MG immediate release tablet Take 1 tablet (5 mg total) by mouth every 6 (six) hours as needed for moderate pain or severe pain. 02/07/19  Yes Domenic Polite, MD  pantoprazole (PROTONIX) 40 MG tablet Take 1 tablet (40 mg total) by mouth daily. 02/07/19  Yes Domenic Polite, MD  polyethylene glycol (MIRALAX / GLYCOLAX) 17 g packet Take 17 g by mouth daily. Patient taking differently: Take 17 g by mouth daily as needed for mild constipation or moderate constipation.  02/07/19  Yes Domenic Polite, MD  sevelamer carbonate (RENVELA) 800 MG tablet Take 2 tablets (1,600 mg total) by mouth 3 (three) times daily with meals. 02/07/19  Yes Domenic Polite, MD  Blood Glucose Monitoring Suppl (ACCU-CHEK AVIVA) device Use as instructed 10/29/18 10/29/19  Isabelle Course, MD  glucose blood (ACCU-CHEK AVIVA) test strip Use twice per day. 10/29/18   Isabelle Course, MD  Insulin Syringe-Needle U-100 (INSULIN SYRINGE .3CC/31GX5/16") 31G X 5/16" 0.3 ML MISC Inject 1 each into the skin 4 (four) times daily. 06/27/18   Clent Demark, PA-C  Lancets (ACCU-CHEK SOFT [facility]) lancets Use twice per day. 10/29/18   Isabelle Course, MD    Physical Exam: Vitals:   02/18/19 1215 02/18/19 1527 02/18/19 1749 02/18/19 1926  BP: (!) 158/91 (!) 167/99 (!) 160/92 (!) 166/115  Pulse:  (!) 105 (!) 108 (!) 118  Resp: (!) 24 (!) 21 (!) 26 20  Temp:      TempSrc:      SpO2:  91% 90% 100%  Weight:      Height:        Constitutional: Sitting up in bed, tired, somewhat anxious Eyes: PERRL ENMT: Mucous membranes are moist. Posterior pharynx clear of any exudate or lesions.Normal dentition.  Neck: Right IJ in place Respiratory: Coarse expiratory rhonchi diffuse lung fields.  Slight increased respiratory effort. No accessory muscle use.  Cardiovascular: Tachycardic, systolic flow murmur present. No extremity edema. 2+ pedal pulses.  HD catheter in place right  chest wall.  AVF RUE with palpable thrill. Abdomen: no tenderness, no masses palpated. No hepatosplenomegaly.  Musculoskeletal: no clubbing / cyanosis. No joint deformity upper and lower extremities. Good ROM, no contractures. Normal muscle tone.  Skin: no rashes, lesions, ulcers. No induration Neurologic: CN 2-12 grossly intact. Sensation intact, Strength 5/5 in all 4.  Psychiatric:  Alert and oriented x 3.     Labs on Admission: I have personally reviewed following labs and imaging studies  CBC: Recent Labs  Lab 02/18/19 1244  WBC 13.6*  NEUTROABS 9.7*  HGB 8.2*  HCT 27.0*  MCV 75.0*  PLT 841   Basic Metabolic Panel: Recent Labs  Lab  02/18/19 1724  NA 134*  K 6.1*  CL 99  CO2 25  GLUCOSE 99  BUN 24*  CREATININE 7.26*  CALCIUM 8.8*   GFR: Estimated Creatinine Clearance: 5.8 mL/min (A) (by C-G formula based on SCr of 7.26 mg/dL (H)). Liver Function Tests: Recent Labs  Lab 02/18/19 1724  AST 75*  ALT 20  ALKPHOS 102  BILITOT 1.5*  PROT 7.0  ALBUMIN 2.1*   No results for input(s): LIPASE, AMYLASE in the last 168 hours. No results for input(s): AMMONIA in the last 168 hours. Coagulation Profile: No results for input(s): INR, PROTIME in the last 168 hours. Cardiac Enzymes: Recent Labs  Lab 02/18/19 1230  TROPONINI 0.04*   BNP (last 3 results) No results for input(s): PROBNP in the last 8760 hours. HbA1C: No results for input(s): HGBA1C in the last 72 hours. CBG: No results for input(s): GLUCAP in the last 168 hours. Lipid Profile: No results for input(s): CHOL, HDL, LDLCALC, TRIG, CHOLHDL, LDLDIRECT in the last 72 hours. Thyroid Function Tests: No results for input(s): TSH, T4TOTAL, FREET4, T3FREE, THYROIDAB in the last 72 hours. Anemia Panel: No results for input(s): VITAMINB12, FOLATE, FERRITIN, TIBC, IRON, RETICCTPCT in the last 72 hours. Urine analysis:    Component Value Date/Time   COLORURINE YELLOW 12/03/2018 1208   APPEARANCEUR HAZY (A)  12/03/2018 1208   LABSPEC 1.020 12/03/2018 1208   PHURINE 6.0 12/03/2018 1208   GLUCOSEU 100 (A) 12/03/2018 1208   HGBUR LARGE (A) 12/03/2018 1208   HGBUR trace-intact 03/08/2010 1452   BILIRUBINUR NEGATIVE 12/03/2018 [place] 12/03/2018 1208   PROTEINUR >300 (A) 12/03/2018 1208   UROBILINOGEN 0.2 03/08/2010 1452   NITRITE NEGATIVE 12/03/2018 1208   LEUKOCYTESUR TRACE (A) 12/03/2018 1208    Radiological Exams on Admission: Dg Chest Port 1 View  Result Date: 02/18/2019 CLINICAL DATA:  Chest pain and shortness of breath EXAM: PORTABLE CHEST 1 VIEW COMPARISON:  02/05/2019 FINDINGS: Cardiac shadow remains enlarged. Aortic calcifications are again seen. Dialysis catheter is again noted on the right. Diffuse alveolar edema is noted similar to that seen on the prior exam. Some more focal scarring in right base is noted. No bony abnormality is noted. IMPRESSION: Changes consistent with CHF similar to that seen on the prior plain film. Electronically Signed   By: Inez Catalina M.D.   On: 02/18/2019 13:30    EKG: Independently reviewed.  Sinus tachycardia, rate 103, QTc 452, not significantly changed from prior.  Assessment/Plan Active Problems:   ESRD on dialysis ([place])   Pulmonary edema   Insulin dependent diabetes mellitus (HCC)   Hyperkalemia   History of CVA (cerebrovascular accident)  SANTIAGA BUTZIN is a 63 y.o. female with medical history significant for ESRD on TTS HD, history of CVA, hypertension, chronic hepatitis C, insulin-dependent diabetes, chronic groundglass opacities with history of ANCA vasculitis/Boop, and substance use who is admitted with pulmonary edema.  Pulmonary edema: Likely cause for patient's shortness of breath is due to volume overload.  Patient reportedly signs of dialysis frequently including last session on 02/17/2019.  Patient with low-grade fever on admission, chest x-ray consistent with pulmonary edema rather than pneumonia.  No other obvious  infection.  Will hold further antibiotics.  Troponin I mildly elevated at 0.04 which appears to be chronic. -Volume management with dialysis -Hold further antibiotics for now, follow-up blood cultures  ESRD on TTS HD: Documented history of poor adherence. -HD per nephrology  Hyperkalemia: K6.1 on admission, no EKG changes. -Given [employer]  10 mg in ED, repeat labs in a.m.  Insulin-dependent diabetes: -Continue Lantus 5 units qhs, add SSI  Hypertension: -Continue amlodipine  History of CVA: -Continue aspirin 81 mg daily  History of BOOP in 02/2018: -Treated at that time with plasmapheresis, steroids, and Cytoxan.  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  GERD: -Continue Protonix  DVT prophylaxis: Subcutaneous heparin Code Status: Full code, confirmed with patient Family Communication: None present on admission Disposition Plan: Pending clinical progress Consults called: Nephrology consulted by EDP Admission status: Observation   Zada Finders MD [facility]  If 7PM-7AM, please contact night-coverage [email or web address]  02/18/2019, 7:57 PM

## 2019-02-18 NOTE — ED Provider Notes (Signed)
Blood pressure (!) 167/99, pulse (!) 105, temperature (!) 100.6 F (38.1 C), temperature source Rectal, resp. rate (!) 21, height 5\' 1"  (1.549 m), weight 53.3 kg, SpO2 91 %.  Assuming care from Dr. Maryan Rued.  In short, Rhonda Lynch is a 63 y.o. female with a chief complaint of Chest Pain and Shortness of Breath .  Refer to the original H&P for additional details.  The current plan of care is to f/u CMP and admit. Patient with ESRD and HD cath with fever.  05:40 PM Patient has very difficult IV access.  IV infiltration in the left upper extremity.  This was removed.  I was unable to find a suitable vein for cannulation in the left upper extremity.  The right upper extremity is restricted.  Assess the patient's EJ.  She had an easily visualized EJ on ultrasound.  See procedure note below.  Will send a CMP off of the sample and continue antibiotics.   Emergency Ultrasound Study:   Angiocath insertion Performed by: Margette Fast  Consent: Verbal consent obtained. Risks and benefits: risks, benefits and alternatives were discussed Immediately prior to procedure the correct patient, procedure, equipment, support staff and site/side marked as needed.  Indication: difficult IV access Preparation: Patient was prepped and draped in the usual sterile fashion. Vein Location: Right EJ vein was visualized during assessment for potential access sites and was found to be patent/ easily compressed with linear ultrasound.  The needle was visualized with real-time ultrasound and guided into the vein. Gauge: 18   Normal blood return.  Patient tolerance: Patient tolerated the procedure well with no immediate complications.  06:32 PM  CMP reviewed. K 6.1 but no EKG changes. Will admit. Continue abx.   06:48 PM  Spoke with nephrology.  They recommend Lokelma 10 g now and they can dialyze in the morning.  07:25 PM  Discussed patient's case with Hospitalist, Dr. Posey Pronto to request admission. Patient and  family (if present) updated with plan. Care transferred to Hospitalist service.  CRITICAL CARE Performed by: Margette Fast Total critical care time: 40 minutes Critical care time was exclusive of separately billable procedures and treating other patients. Critical care was necessary to treat or prevent imminent or life-threatening deterioration. Critical care was time spent personally by me on the following activities: development of treatment plan with patient and/or surrogate as well as nursing, discussions with consultants, evaluation of patient's response to treatment, examination of patient, obtaining history from patient or surrogate, ordering and performing treatments and interventions, ordering and review of laboratory studies, ordering and review of radiographic studies, pulse oximetry and re-evaluation of patient's condition.  Nanda Quinton, MD Emergency Medicine   I reviewed all nursing notes, vitals, pertinent old records, EKGs, labs, imaging (as available).    Margette Fast, MD 02/18/19 [phone]

## 2019-02-18 NOTE — Progress Notes (Signed)
New Admission Note:  Arrival Method: Stretcher Mental Orientation: Alert and oriented x 2 Telemetry: Box 01 ST Assessment: Completed Skin: Warm and dry.  IV:  Rt EJ  Pain: Denies  Tubes: N/A Safety Measures: Safety Fall Prevention Plan initiated.  Admission: Completed 5 M  Orientation: Patient has been orientated to the room, unit and the staff. Welcome booklet given.  Family: N/A  Orders have been reviewed and implemented. Will continue to monitor the patient. Call light has been placed within reach and bed alarm has been activated.   Sima Matas BSN, RN  Phone Number: [phone]

## 2019-02-18 NOTE — ED Triage Notes (Signed)
Pt arrives via EMS from home with reports of CP and SOB since yesterday. Pt has last HD treatment yesterday but did not get full treatment. HD TThSat. States it is central CP when she coughs.

## 2019-02-18 NOTE — Discharge Summary (Signed)
Physician Discharge Summary  Rhonda Lynch ZJI:[id number] DOB: April 20, 1956 DOA: 02/03/2019  PCP: Clent Demark, PA-C  Admit date: 02/03/2019 Discharge date: 02/07/2019  Time spent: 35 minutes  Recommendations for Outpatient Follow-up:  PCP in 1 week Pulmonary Dr. Vaughan Browner in 2 weeks with HRCT  Discharge Diagnoses:  Acute hypoxic respiratory failure Volume overload Healthcare associated pneumonia Chronic groundglass opacities  ESRD on dialysis ([place]) Anemia of chronic disease Type 2 diabetes mellitus History of CVA Hyperkalemia History of BOOP History of ANCA positive vasculitis   Sepsis ([place])   Hypoxia   Discharge Condition: Improved  Diet recommendation: Renal, diabetic  Filed Weights   02/07/19 0436 02/07/19 0716 02/07/19 1109  Weight: 56.8 kg 58.1 kg 53.3 kg    History of present illness:  LindaRobertsis a63 y.o.female,with past medical history significant for end-stage renal disease on hemodialysis, noncompliance, history of drug abuse and hypertension presented with left chest wall pain, pleuritic associated with increased shortness of breath, fever chills and productive cough.   She was sent from her hemodialysis center to the emergency room due to fever and shortness of breath   Hospital Course:    1.  Acute hypoxic respiratory failure -Febrile to 101.8 on admission, CT Angio chest with patchy consolidation and diffuse groundglass changes/ground glass changes are chronic from 2019. -due to fever, elevated pro calcitonin level and patchy consolidation on chronic ground glass changes suggesting bacterial pneumonia, in addition to fluid overload and chronic ground glass changes -COVID PCR negative x2 -She was treated with broad-spectrum antibiotics and extra volume removal with HD, patient with a history of signing off early from hemodialysis  -clinically improved with volume removal on dialysis and antibiotics -Weaned off O2 -History of ANCA positive  vasculitis, she was treated for BOOP with steroids in 02/2018 per Pulm consult inpatient, h/o ANCA positive vasculitis and possibly underlying pneumonitis at baseline accounting for chronic underlying groundglass changes, worsened acutely in the setting of infection and fluid -d/w Pulm Dr.Mannam 5/28 who reviewed CT chest, suspect some of these findings are related to fluid overload and pneumonia, ILD appears less likely will need Follow up for this, Dr.Mannam will arrange FU for her and get HRCT when she is improved and at baseline  2.  Abdominal pain  -resolved now, etiology unclear, due to severity of symptoms and impressive exam obtained a CT scan 2days ago which was unremarkable -tolerating diet  3. Hyperkalemia -Unfortunately signs off from HD early regularly, long history of noncompliance -Resolved with urgent HD  3.  ESRD on hemodialysis with poor compliance -Nephrology following, improved following extra HD, back to dry weight  4.  Anemia of chronic disease -anemia panel suggestive of iron deficiency and chronic disease -Continue EPO and iron with dialysis  5.  Type 2 diabetes mellitus -Continue Lantus and sliding scale  6.  History of CVA -Continue aspirin  7.  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 Jonnie Finner Discussed with pulmonary Dr. Vaughan Browner  Discharge Exam: Vitals:   02/07/19 1109 02/07/19 1151  BP: 128/67 132/85  Pulse: 98 (!) 101  Resp: 20 16  Temp: 98.8 F (37.1 C) 98.5 F (36.9 C)  SpO2: 98% 98%    General: AAOx3 Cardiovascular: S1S2/RRR Respiratory: Few scattered rhonchi especially at the bases  Discharge Instructions   Discharge Instructions    Discharge instructions   Complete by:  As directed    Renal Diabetic diet  Increase activity slowly   Complete by:  As directed      Allergies as of 02/07/2019      Reactions   Acetaminophen  Nausea And Vomiting   Patient states Acetaminophen and Acetaminophen containing products make her nauseated. She demonstrated this 06/21/18 with nausea followed by emesis.   Hydroxyzine Itching      Medication List    STOP taking these medications   diphenhydrAMINE 2 % cream Commonly known as:  BENADRYL   nicotine 21 mg/24hr patch Commonly known as:  NICODERM CQ - dosed in mg/24 hours     TAKE these medications   Accu-Chek Aviva device Use as instructed   accu-chek soft touch lancets Use twice per day.   amLODipine 5 MG tablet Commonly known as:  NORVASC Take 1 tablet (5 mg total) by mouth at bedtime.   aspirin 81 MG EC tablet Take 1 tablet (81 mg total) by mouth daily.   calcitRIOL 0.25 MCG capsule Commonly known as:  ROCALTROL Take 1 capsule (0.25 mcg total) by mouth every Monday, Wednesday, and Friday with hemodialysis.   Dialyvite 800-Zinc 15 0.8 MG Tabs Take 1 tablet by mouth daily.   fluticasone 50 MCG/ACT nasal spray Commonly known as:  FLONASE Place 2 sprays into both nostrils daily.   gabapentin 100 MG capsule Commonly known as:  NEURONTIN Take 1 capsule (100 mg total) by mouth 2 (two) times daily for 30 days. What changed:  when to take this   glucose blood test strip Commonly known as:  Accu-Chek Aviva Use twice per day.   hydrocortisone cream 1 % Apply 1 application topically 2 (two) times daily. What changed:    when to take this  reasons to take this   insulin aspart 100 UNIT/ML injection Commonly known as:  novoLOG Inject 5 Units into the skin 3 (three) times daily with meals. What changed:  how much to take   insulin glargine 100 UNIT/ML injection Commonly known as:  LANTUS Inject 0.05 mLs (5 Units total) into the skin at bedtime.   INSULIN SYRINGE .3CC/31GX5/16" 31G X 5/16" 0.3 ML Misc Inject 1 each into the skin 4 (four) times daily.   lidocaine-prilocaine cream Commonly known as:  EMLA Apply 1 application topically See admin  instructions. Apply small amount to access site (AVF)before dialysis. Cover with occlusive dressing (Saran wrap)   oxyCODONE 5 MG immediate release tablet Commonly known as:  Oxy IR/ROXICODONE Take 1 tablet (5 mg total) by mouth every 6 (six) hours as needed for moderate pain or severe pain.   pantoprazole 40 MG tablet Commonly known as:  Protonix Take 1 tablet (40 mg total) by mouth daily.   polyethylene glycol 17 g packet Commonly known as:  MIRALAX / GLYCOLAX Take 17 g by mouth daily.   sevelamer carbonate 800 MG tablet Commonly known as:  RENVELA Take 2 tablets (1,600 mg total) by mouth 3 (three) times daily with meals.     ASK your doctor about these medications   cefdinir 300 MG capsule Commonly known as:  OMNICEF Take 1 capsule (300 mg total) by mouth every 12 (twelve) hours for 3 days. Ask about: Should I take this medication?      Allergies  Allergen Reactions  . Acetaminophen Nausea And Vomiting    Patient states Acetaminophen and Acetaminophen containing products make her nauseated. She demonstrated this 06/21/18 with nausea followed by emesis.  Marland Kitchen Hydroxyzine Itching   Follow-up Information    Clent Demark, PA-C. Schedule an  appointment as soon as possible for a visit in 1 week(s).   Specialty:  Physician Assistant       Marshell Garfinkel, MD. Go in 1 month(s).   Specialty:  Pulmonary Disease Why:  Office will call  Contact information: [address] [address] [phone]            The results of significant diagnostics from this hospitalization (including imaging, microbiology, ancillary and laboratory) are listed below for reference.    Significant Diagnostic Studies: Dg Chest 2 View  Result Date: 02/05/2019 CLINICAL DATA:  Hypoxia.  Renal failure EXAM: CHEST - 2 VIEW COMPARISON:  Chest radiograph and chest CT Feb 03, 2019 FINDINGS: Central catheter tip is at the cavoatrial junction. No pneumothorax. Widespread  alveolar opacity throughout the lungs persists with airspace consolidation in the right base. Appearance is similar to recent studies. There is cardiomegaly with pulmonary venous hypertension. There is aortic atherosclerosis. No adenopathy. No bone lesions. IMPRESSION: Appearance of the lungs is essentially stable compared to 2 days prior. There is stable cardiomegaly with pulmonary venous hypertension. Suspect congestive heart failure with extensive alveolar edema. There may also be a degree of superimposed pneumonia and/or ARDS. More than one of these entities may be present concurrently. Aortic Atherosclerosis (ICD10-I70.0). Electronically Signed   By: Lowella Grip III M.D.   On: 02/05/2019 08:08   Ct Angio Chest Pe W Or Wo Contrast  Result Date: 02/03/2019 CLINICAL DATA:  Dyspnea.  Fever.  Cough.  Abdominal pain. EXAM: CT ANGIOGRAPHY CHEST WITH CONTRAST TECHNIQUE: Multidetector CT imaging of the chest was performed using the standard protocol during bolus administration of intravenous contrast. Multiplanar CT image reconstructions and MIPs were obtained to evaluate the vascular anatomy. CONTRAST:  173mL OMNIPAQUE IOHEXOL 350 MG/ML SOLN COMPARISON:  01/12/2019 chest CT angiogram. Chest radiograph from earlier today. FINDINGS: Cardiovascular: The study is moderate quality for the evaluation of pulmonary embolism, some motion degradation. There are no convincing filling defects in the central, lobar, segmental or subsegmental pulmonary artery branches to suggest acute pulmonary embolism. Atherosclerotic nonaneurysmal thoracic aorta. Top-normal caliber main pulmonary artery (3.2 cm diameter), stable. Stable moderate cardiomegaly. No significant pericardial fluid/thickening. Right internal jugular central venous catheter terminates in the right atrium. Mediastinum/Nodes: No discrete thyroid nodules. Unremarkable esophagus. No pathologically enlarged axillary, mediastinal or hilar lymph nodes. Lungs/Pleura:  No pneumothorax. Trace dependent left pleural effusion. No right pleural effusion. Extensive patchy peribronchovascular consolidation and ground-glass opacity throughout both lungs, most prominent in the upper lobes, significantly worsened from prior chest CT. Relative sparing of peripheral and basilar lungs. No discrete lung masses. Upper abdomen: Contrast reflux into the IVC and hepatic veins. Cholecystectomy. Musculoskeletal: No aggressive appearing focal osseous lesions. Mild thoracic spondylosis. Review of the MIP images confirms the above findings. IMPRESSION: 1. No evidence of pulmonary embolism. 2. Moderate cardiomegaly. Extensive patchy central consolidation and ground-glass opacity symmetrically involving both lungs, significantly worsened from 01/12/2019 chest CT. Trace dependent left pleural effusion. Contrast reflux into the IVC and hepatic veins. Findings favor congestive heart failure with severe acute cardiogenic pulmonary edema. Multilobar pneumonia or ARDS cannot be excluded. Aortic Atherosclerosis (ICD10-I70.0). Electronically Signed   By: Ilona Sorrel M.D.   On: 02/03/2019 16:45   Ct Abdomen Pelvis W Contrast  Result Date: 02/05/2019 CLINICAL DATA:  Left lower quadrant abdominal pain and fever beginning 2 days ago. Hemodialysis patient. EXAM: CT ABDOMEN AND PELVIS WITH CONTRAST TECHNIQUE: Multidetector CT imaging of the abdomen and pelvis was performed using the  standard protocol following bolus administration of intravenous contrast. CONTRAST:  78mL OMNIPAQUE IOHEXOL 300 MG/ML  SOLN COMPARISON:  12/03/2018 FINDINGS: Lower chest: Patchy interstitial pulmonary densities in both lower lungs are improved since the study of 2 months ago. This could relate to improving pneumonia or could indicate an element of interstitial edema. No effusion. Cardiomegaly. Hepatobiliary: Liver parenchyma appears normal. There has been previous cholecystectomy. Mild biliary ductal prominence consistent with  previous cholecystectomy, similar to the previous study. Pancreas: Normal Spleen: Chronic benign appearing 1.7 cm low-density, not clinically relevant. Adrenals/Urinary Tract: The adrenal glands are normal. Renal size is normal. The kidneys are poorly functioning without visible excretion. No focal renal lesion. No hydroureteronephrosis. Bladder appears normal. Stomach/Bowel: No bowel pathology is seen. No evidence of ileus or obstruction. No evidence of diverticulosis or diverticulitis. Vascular/Lymphatic: Aortic atherosclerosis. No aneurysm. Atherosclerosis of the branch vessels. IVC is normal. No adenopathy. Small retroperitoneal phleboliths. Reproductive: Previous hysterectomy. No pelvic mass. 1.5 cm fatty area associated with the left ovary, possibly a small benign dermoid, unchanged. Other: No free fluid or air. Musculoskeletal: Lower lumbar degenerative changes most severe at L4-5. IMPRESSION: No acute abdominal or pelvic finding. No specific cause of left lower quadrant pain. No sign of diverticulosis or diverticulitis or other acute bowel pathology. Interstitial pulmonary density, improved since the previous study. This could represent resolving pneumonia or edema. No effusions. Previous cholecystectomy and hysterectomy. Poorly functioning/nonfunctioning kidneys. Electronically Signed   By: Nelson Chimes M.D.   On: 02/05/2019 13:48   Dg Chest Portable 1 View  Result Date: 02/03/2019 CLINICAL DATA:  Shortness of breath. EXAM: PORTABLE CHEST 1 VIEW COMPARISON:  Radiograph of Jan 19, 2019. FINDINGS: Stable cardiomegaly. Right internal jugular dialysis catheter is unchanged in position. No pneumothorax or pleural effusion is noted. Stable bilateral reticular densities are noted concerning for edema. Minimal pleural effusions may be present. Bony thorax is unremarkable. IMPRESSION: Stable bilateral reticular densities are noted concerning for pulmonary edema. Minimal pleural effusions. Electronically Signed    By: Marijo Conception M.D.   On: 02/03/2019 13:52   Vas [place] Upper Extremity Venous Duplex  Result Date: 02/05/2019 UPPER VENOUS STUDY  Indications: Pain Performing Technologist: June Leap RDMS, RVT  Examination Guidelines: A complete evaluation includes B-mode imaging, spectral Doppler, color Doppler, and power Doppler as needed of all accessible portions of each vessel. Bilateral testing is considered an integral part of a complete examination. Limited examinations for reoccurring indications may be performed as noted.  Right Findings: +----------+------------+---------+-----------+----------+--------------+ RIGHT     CompressiblePhasicitySpontaneousProperties   Summary     +----------+------------+---------+-----------+----------+--------------+ IJV                                                 Not visualized +----------+------------+---------+-----------+----------+--------------+ Subclavian               Yes       Yes                             +----------+------------+---------+-----------+----------+--------------+ Axillary      Full       Yes       Yes                             +----------+------------+---------+-----------+----------+--------------+ Brachial  Full       Yes       Yes                             +----------+------------+---------+-----------+----------+--------------+ Radial        Full       Yes       Yes                             +----------+------------+---------+-----------+----------+--------------+ Ulnar         Full                                                 +----------+------------+---------+-----------+----------+--------------+ Cephalic      Full                                                 +----------+------------+---------+-----------+----------+--------------+ Basilic       Full                                                  +----------+------------+---------+-----------+----------+--------------+  Left Findings: +----------+------------+---------+-----------+----------+--------------+ LEFT      CompressiblePhasicitySpontaneousProperties   Summary     +----------+------------+---------+-----------+----------+--------------+ Subclavian                                          Not visualized +----------+------------+---------+-----------+----------+--------------+  Summary:  Right: No evidence of deep vein thrombosis in the upper extremity. No evidence of superficial vein thrombosis in the upper extremity.  *See table(s) above for measurements and observations.  Diagnosing physician: Servando Snare MD Electronically signed by Servando Snare MD on 02/05/2019 at 12:42:34 PM.    Final     Microbiology: No results found for this or any previous visit (from the past 240 hour(s)).   Labs: Basic Metabolic Panel: No results for input(s): NA, K, CL, CO2, GLUCOSE, BUN, CREATININE, CALCIUM, MG, PHOS in the last 168 hours. Liver Function Tests: No results for input(s): AST, ALT, ALKPHOS, BILITOT, PROT, ALBUMIN in the last 168 hours. No results for input(s): LIPASE, AMYLASE in the last 168 hours. No results for input(s): AMMONIA in the last 168 hours. CBC: No results for input(s): WBC, NEUTROABS, HGB, HCT, MCV, PLT in the last 168 hours. Cardiac Enzymes: No results for input(s): CKTOTAL, CKMB, CKMBINDEX, TROPONINI in the last 168 hours. BNP: BNP (last 3 results) Recent Labs    06/20/18 2042 12/03/18 1209 02/03/19 1400  BNP >4,500.0* >4,500.0* >4,500.0*    ProBNP (last 3 results) No results for input(s): PROBNP in the last 8760 hours.  CBG: No results for input(s): GLUCAP in the last 168 hours.     Signed:  Domenic Polite MD.  [facility] 02/18/2019, 10:20 AM

## 2019-02-19 ENCOUNTER — Other Ambulatory Visit: Payer: Self-pay

## 2019-02-19 ENCOUNTER — Encounter (HOSPITAL_COMMUNITY): Payer: Self-pay | Admitting: *Deleted

## 2019-02-19 DIAGNOSIS — K219 Gastro-esophageal reflux disease without esophagitis: Secondary | ICD-10-CM | POA: Diagnosis present

## 2019-02-19 DIAGNOSIS — Z992 Dependence on renal dialysis: Secondary | ICD-10-CM | POA: Diagnosis not present

## 2019-02-19 DIAGNOSIS — K529 Noninfective gastroenteritis and colitis, unspecified: Secondary | ICD-10-CM | POA: Diagnosis present

## 2019-02-19 DIAGNOSIS — Z86711 Personal history of pulmonary embolism: Secondary | ICD-10-CM | POA: Diagnosis not present

## 2019-02-19 DIAGNOSIS — T82868A Thrombosis of vascular prosthetic devices, implants and grafts, initial encounter: Secondary | ICD-10-CM | POA: Diagnosis not present

## 2019-02-19 DIAGNOSIS — E119 Type 2 diabetes mellitus without complications: Secondary | ICD-10-CM | POA: Diagnosis not present

## 2019-02-19 DIAGNOSIS — F1410 Cocaine abuse, uncomplicated: Secondary | ICD-10-CM | POA: Diagnosis present

## 2019-02-19 DIAGNOSIS — Z8673 Personal history of transient ischemic attack (TIA), and cerebral infarction without residual deficits: Secondary | ICD-10-CM | POA: Diagnosis not present

## 2019-02-19 DIAGNOSIS — N2581 Secondary hyperparathyroidism of renal origin: Secondary | ICD-10-CM | POA: Diagnosis present

## 2019-02-19 DIAGNOSIS — B182 Chronic viral hepatitis C: Secondary | ICD-10-CM | POA: Diagnosis present

## 2019-02-19 DIAGNOSIS — Y832 Surgical operation with anastomosis, bypass or graft as the cause of abnormal reaction of the patient, or of later complication, without mention of misadventure at the time of the procedure: Secondary | ICD-10-CM | POA: Diagnosis not present

## 2019-02-19 DIAGNOSIS — Z9119 Patient's noncompliance with other medical treatment and regimen: Secondary | ICD-10-CM | POA: Diagnosis not present

## 2019-02-19 DIAGNOSIS — R6510 Systemic inflammatory response syndrome (SIRS) of non-infectious origin without acute organ dysfunction: Secondary | ICD-10-CM | POA: Diagnosis not present

## 2019-02-19 DIAGNOSIS — N186 End stage renal disease: Secondary | ICD-10-CM

## 2019-02-19 DIAGNOSIS — E8889 Other specified metabolic disorders: Secondary | ICD-10-CM | POA: Diagnosis present

## 2019-02-19 DIAGNOSIS — I132 Hypertensive heart and chronic kidney disease with heart failure and with stage 5 chronic kidney disease, or end stage renal disease: Secondary | ICD-10-CM | POA: Diagnosis present

## 2019-02-19 DIAGNOSIS — E875 Hyperkalemia: Secondary | ICD-10-CM | POA: Diagnosis present

## 2019-02-19 DIAGNOSIS — D509 Iron deficiency anemia, unspecified: Secondary | ICD-10-CM | POA: Diagnosis present

## 2019-02-19 DIAGNOSIS — Z9981 Dependence on supplemental oxygen: Secondary | ICD-10-CM | POA: Diagnosis not present

## 2019-02-19 DIAGNOSIS — Z888 Allergy status to other drugs, medicaments and biological substances status: Secondary | ICD-10-CM | POA: Diagnosis not present

## 2019-02-19 DIAGNOSIS — F17210 Nicotine dependence, cigarettes, uncomplicated: Secondary | ICD-10-CM | POA: Diagnosis present

## 2019-02-19 DIAGNOSIS — Z794 Long term (current) use of insulin: Secondary | ICD-10-CM

## 2019-02-19 DIAGNOSIS — E8779 Other fluid overload: Secondary | ICD-10-CM | POA: Diagnosis present

## 2019-02-19 DIAGNOSIS — Z20828 Contact with and (suspected) exposure to other viral communicable diseases: Secondary | ICD-10-CM | POA: Diagnosis present

## 2019-02-19 DIAGNOSIS — J810 Acute pulmonary edema: Secondary | ICD-10-CM | POA: Diagnosis not present

## 2019-02-19 DIAGNOSIS — E1122 Type 2 diabetes mellitus with diabetic chronic kidney disease: Secondary | ICD-10-CM | POA: Diagnosis present

## 2019-02-19 DIAGNOSIS — I5023 Acute on chronic systolic (congestive) heart failure: Secondary | ICD-10-CM | POA: Diagnosis present

## 2019-02-19 DIAGNOSIS — D631 Anemia in chronic kidney disease: Secondary | ICD-10-CM | POA: Diagnosis present

## 2019-02-19 DIAGNOSIS — R072 Precordial pain: Secondary | ICD-10-CM | POA: Diagnosis present

## 2019-02-19 LAB — RENAL FUNCTION PANEL
Albumin: 2 g/dL — ABNORMAL LOW (ref 3.5–5.0)
Anion gap: 13 (ref 5–15)
BUN: 31 mg/dL — ABNORMAL HIGH (ref 8–23)
CO2: 26 mmol/L (ref 22–32)
Calcium: 8.9 mg/dL (ref 8.9–10.3)
Chloride: 99 mmol/L (ref 98–111)
Creatinine, Ser: 8.34 mg/dL — ABNORMAL HIGH (ref 0.44–1.00)
GFR calc Af Amer: 5 mL/min — ABNORMAL LOW
GFR calc non Af Amer: 5 mL/min — ABNORMAL LOW
Glucose, Bld: 101 mg/dL — ABNORMAL HIGH (ref 70–99)
Phosphorus: 5 mg/dL — ABNORMAL HIGH (ref 2.5–4.6)
Potassium: 5.7 mmol/L — ABNORMAL HIGH (ref 3.5–5.1)
Sodium: 138 mmol/L (ref 135–145)

## 2019-02-19 LAB — CBC
HCT: 24.6 % — ABNORMAL LOW (ref 36.0–46.0)
Hemoglobin: 7.6 g/dL — ABNORMAL LOW (ref 12.0–15.0)
MCH: 23 pg — ABNORMAL LOW (ref 26.0–34.0)
MCHC: 30.9 g/dL (ref 30.0–36.0)
MCV: 74.3 fL — ABNORMAL LOW (ref 80.0–100.0)
Platelets: 258 10*3/uL (ref 150–400)
RBC: 3.31 MIL/uL — ABNORMAL LOW (ref 3.87–5.11)
RDW: 23.3 % — ABNORMAL HIGH (ref 11.5–15.5)
WBC: 16 10*3/uL — ABNORMAL HIGH (ref 4.0–10.5)
nRBC: 0 % (ref 0.0–0.2)

## 2019-02-19 LAB — GLUCOSE, CAPILLARY
Glucose-Capillary: 101 mg/dL — ABNORMAL HIGH (ref 70–99)
Glucose-Capillary: 161 mg/dL — ABNORMAL HIGH (ref 70–99)
Glucose-Capillary: 93 mg/dL (ref 70–99)
Glucose-Capillary: 152 mg/dL — ABNORMAL HIGH (ref 70–99)

## 2019-02-19 LAB — MRSA PCR SCREENING: MRSA by PCR: NEGATIVE

## 2019-02-19 MED ORDER — CHLORHEXIDINE GLUCONATE CLOTH 2 % EX PADS
6.0000 | MEDICATED_PAD | Freq: Every day | CUTANEOUS | Status: DC
  Administered 2019-02-20 – 2019-02-21 (×2): 6 via TOPICAL

## 2019-02-19 MED ORDER — SODIUM CHLORIDE 0.9 % IV SOLN
125.0000 mg | INTRAVENOUS | Status: DC
  Administered 2019-02-19 – 2019-02-21 (×2): 125 mg via INTRAVENOUS
  Filled 2019-02-19 (×2): qty 10

## 2019-02-19 MED ORDER — CHLORHEXIDINE GLUCONATE CLOTH 2 % EX PADS: 6.0000 | MEDICATED_PAD | Freq: Every day | CUTANEOUS | Status: DC

## 2019-02-19 MED ORDER — DOXERCALCIFEROL 4 MCG/2ML IV SOLN
4.0000 ug | INTRAVENOUS | Status: DC
  Administered 2019-02-19 – 2019-02-21 (×2): 4 ug via INTRAVENOUS

## 2019-02-19 MED ORDER — ORAL CARE MOUTH RINSE
15.0000 mL | Freq: Two times a day (BID) | OROMUCOSAL | Status: DC
  Administered 2019-02-19 – 2019-02-20 (×2): 15 mL via OROMUCOSAL

## 2019-02-19 MED ORDER — DOXERCALCIFEROL 4 MCG/2ML IV SOLN
INTRAVENOUS | Status: AC
  Filled 2019-02-19: qty 2

## 2019-02-19 MED ORDER — HEPARIN SODIUM (PORCINE) 1000 UNIT/ML IJ SOLN
INTRAMUSCULAR | Status: AC
  Filled 2019-02-19: qty 4

## 2019-02-19 NOTE — Consult Note (Addendum)
[facility] Renal Consultation Note    Indication for Consultation:  Management of ESRD/hemodialysis; anemia, hypertension/volume and secondary hyperparathyroidism PCP: Clent Demark, PA-C  HPI: Rhonda Lynch is a 63 y.o. 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  Tuesday at dialysis she presented with a 4.6 kg weigh gain over the weekend in addition to leaving above EDW Saturday .  She ran 2.75 hours with net UF of only 2 L .   After arrival in the ED she spiked at temp to 100.6 BP in the usual range 160/90-100s COVID again negative. WBC 13.6 (noted to be 20 K at her outpatient unit 6/2) up to 16 K today hgb initally 8.2 down to 7.6 plts ok EKG sinus tach. Initial K was 6.1 - she was treated with lokelma in the ED  Repeat K 5.7 She is due for dialysis today.  She had a similar admission in May with d/c 5/30. Her volume was titrated back to EDW with plans to f/u with Dr. Vaughan Browner in 2 weeks with HRCT.  Evaulated this am.  C/o chest pain, abdominal pain and SOB. Denies blood in stool - last stool was bronw.  Lives in a motel alone and mostly eats frozen food from [employer].  Tearful at times about pain and notes difficulty speaking at times (she thinks this is new). Denies etoh or drug use.    Past Medical History:  Diagnosis Date  . Diabetes mellitus without complication ([place])   . ESRD (end stage renal disease) on dialysis ([place])    "TTS; [place]; they're moving me to another one" (09/22/2018)  . Hepatitis C    diagnosed 09/2018  . Hypertension   . Oxygen deficiency    2 L at night  . Stroke ([place])   . Substance abuse ([place])    has been clean for 4 months    Past Surgical History:  Procedure Laterality Date  . AV FISTULA PLACEMENT Left 03/05/2018   Procedure: ARTERIOVENOUS (AV)  FISTULA CREATION  LEFT UPPER EXTREMITY;  Surgeon: [name], MD;  Location: [place];  Service: Vascular;  Laterality: Left;  . AV FISTULA PLACEMENT Left 07/21/2018   Procedure: ARTERIOVENOUS (AV) FISTULA CREATION;  Surgeon: Marty Heck, MD;  Location: [place];  Service: Vascular;  Laterality: Left;  . AV FISTULA PLACEMENT Left 10/27/2018   Procedure: INSERTION OF ARTERIOVENOUS (AV) GORE-TEX GRAFT UPPER ARM;  Surgeon: Marty Heck, MD;  Location: [place];  Service: Vascular;  Laterality: Left;  . AV FISTULA PLACEMENT Right 01/19/2019   Procedure: INSERTION OF ARTERIOVENOUS GRAFT RIGHT ARM;  Surgeon: [name], MD;  Location: [place];  Service: Vascular;  Laterality: Right;  . BASCILIC VEIN TRANSPOSITION Left 09/29/2018   Procedure: BASILIC VEIN TRANSPOSITION SECOND STAGE LEFT UPPER ARM;  Surgeon: Marty Heck, MD;  Location: [place];  Service: Vascular;  Laterality: Left;  . HEMATOMA EVACUATION Left 03/06/2018   Procedure: EVACUATION HEMATOMA LEFT ARM;  Surgeon: Angelia Mould, MD;  Location: [place];  Service: Vascular;  Laterality: Left;  . I&D EXTREMITY Left 10/06/2018   Procedure: IRRIGATION AND DEBRIDEMENT ARM;  Surgeon: Marty Heck, MD;  Location: [place];  Service: Vascular;  Laterality: Left;  . INSERTION OF DIALYSIS CATHETER Left 04/29/2018   Procedure: INSERTION OF  DIALYSIS CATHETER LEFT INTERNAL JUGULAR;  Surgeon: Angelia Mould, MD;  Location: [place];  Service: Vascular;  Laterality: Left;  . INSERTION OF DIALYSIS CATHETER Right 01/19/2019   Procedure: INSERTION OF HEMO DIALYSIS  PALINDROME TUNNELED CATHETER;  Surgeon: [name], MD;  [facility];  Service: Vascular;  Laterality: Right;  . IR FLUORO GUIDE CV LINE RIGHT  02/18/2018  . IR FLUORO GUIDE CV LINE RIGHT  02/26/2018  . IR FLUORO GUIDE CV LINE RIGHT  01/14/2019  . IR REMOVAL TUN CV CATH W/O FL  04/27/2018  . IR US GUIDE VASC ACCESS RIGHT  02/18/2018  . IR US GUIDE VASC ACCESS RIGHT   01/14/2019  . TEE WITHOUT CARDIOVERSION N/A 04/29/2018   Procedure: TRANSESOPHAGEAL ECHOCARDIOGRAM (TEE);  Surgeon: Sueanne Margarita, MD;  Location: [facility];  Service: Cardiovascular;  Laterality: N/A;  . THROMBECTOMY AND REVISION OF ARTERIOVENTOUS (AV) GORETEX  GRAFT Left 12/11/2018   Procedure: THROMBECTOMY AND  REVISION  ARTERIOVENTOUS GORETEX  GRAFT LEFT ARM;  Surgeon: [name], MD;  Location: [place];  Service: Vascular;  Laterality: Left;  Marland Kitchen VENOGRAM Left 10/27/2018   Procedure: LEFT CENTRAL VENOGRAM;  Surgeon: Marty Heck, MD;  Location: [facility] OR;  Service: Vascular;  Laterality: Left;   Family History  Problem Relation Age of Onset  . Autoimmune disease Neg Hx    Social History:  reports that she has been smoking cigarettes. She has a 24.00 pack-year smoking history. She has never used smokeless tobacco. She reports previous alcohol use. She reports current drug use. Drugs: Cocaine and Heroin. Allergies  Allergen Reactions  . Acetaminophen Nausea And Vomiting    Patient states Acetaminophen and Acetaminophen containing products make her nauseated. She demonstrated this 06/21/18 with nausea followed by emesis.  Marland Kitchen Hydroxyzine Itching   Prior to Admission medications   Medication Sig Start Date End Date Taking? Authorizing Provider  amLODipine (NORVASC) 5 MG tablet Take 1 tablet (5 mg total) by mouth at bedtime. 02/07/19  Yes Domenic Polite, MD  aspirin 81 MG EC tablet Take 1 tablet (81 mg total) by mouth daily. 02/07/19  Yes Domenic Polite, MD  B Complex-C-Zn-Folic Acid (DIALYVITE 371-GGYI 15) 0.8 MG TABS Take 1 tablet by mouth daily. 11/04/18  Yes [provider]  calcitRIOL (ROCALTROL) 0.25 MCG capsule Take 1 capsule (0.25 mcg total) by mouth every Monday, Wednesday, and Friday with hemodialysis. Patient taking differently: Take 0.25 mcg by mouth every Tuesday, Thursday, and Saturday at 6 PM.  02/09/19  Yes Domenic Polite, MD  fluticasone [facility]) 50 MCG/ACT nasal  spray Place 2 sprays into both nostrils as needed for allergies.    Yes [provider]  gabapentin (NEURONTIN) 100 MG capsule Take 1 capsule (100 mg total) by mouth 2 (two) times daily for 30 days. Patient taking differently: Take 100 mg by mouth 3 (three) times daily.  02/07/19 03/09/19 Yes Domenic Polite, MD  hydrocortisone cream 1 % Apply 1 application topically 2 (two) times daily. Patient taking differently: Apply 1 application topically 2 (two) times daily as needed for itching.  01/20/19  Yes Pokhrel, Laxman, MD  insulin aspart (NOVOLOG) 100 UNIT/ML injection Inject 5 Units into the skin 3 (three) times daily with meals. Patient taking differently: Inject 3 Units into the skin 3 (three) times daily with meals.  10/13/18  Yes Alphonzo Grieve, MD  insulin glargine (LANTUS) 100 UNIT/ML injection Inject 0.05 mLs (5 Units total) into the skin at bedtime. 02/07/19  Yes Domenic Polite, MD  oxyCODONE (OXY IR/ROXICODONE) 5 MG immediate release tablet Take 1 tablet (5 mg total) by mouth every 6 (six) hours as needed for moderate pain or severe pain. 02/07/19  Yes Domenic Polite, MD  pantoprazole (PROTONIX) 40 MG tablet Take 1 tablet (40 mg total) by mouth daily. 02/07/19  Yes Domenic Polite, MD  polyethylene glycol (MIRALAX / GLYCOLAX) 17 g packet Take 17 g by mouth daily. Patient taking differently: Take 17 g by mouth daily as needed for mild constipation or moderate constipation.  02/07/19  Yes Domenic Polite, MD  sevelamer carbonate (RENVELA) 800 MG tablet Take 2 tablets (1,600 mg total) by mouth 3 (three) times daily with meals. 02/07/19  Yes Domenic Polite, MD  Blood Glucose Monitoring Suppl (ACCU-CHEK AVIVA) device Use as instructed 10/29/18 10/29/19  Isabelle Course, MD  glucose blood (ACCU-CHEK AVIVA) test strip Use twice per day. 10/29/18   Isabelle Course, MD  Insulin Syringe-Needle U-100 (INSULIN SYRINGE .3CC/31GX5/16") 31G X 5/16" 0.3 ML MISC Inject 1 each into the skin 4 (four) times daily.  06/27/18   Clent Demark, PA-C  Lancets (ACCU-CHEK SOFT [facility]) lancets Use twice per day. 10/29/18   Isabelle Course, MD   Current Facility-Administered Medications  Medication Dose Route Frequency Provider Last Rate Last Dose  . amLODipine (NORVASC) tablet 5 mg  5 mg Oral QHS Zada Finders R, MD   5 mg at 02/18/19 2221  . aspirin EC tablet 81 mg  81 mg Oral Daily Lenore Cordia, MD      . Chlorhexidine Gluconate Cloth 2 % PADS 6 each  6 each Topical Q0600 Alric Seton, PA-C      . doxercalciferol (HECTOROL) injection 4 mcg  4 mcg Intravenous Q T,Th,Sa-HD Alric Seton, PA-C      . ferric gluconate (NULECIT) 125 mg in sodium chloride 0.9 % 100 mL IVPB  125 mg Intravenous Q T,Th,Sa-HD Alric Seton, PA-C      . gabapentin (NEURONTIN) capsule 100 mg  100 mg Oral TID Zada Finders R, MD   100 mg at 02/18/19 2221  . heparin injection 5,000 Units  5,000 Units Subcutaneous Q8H Lenore Cordia, MD   5,000 Units at 02/19/19 [phone]  . HYDROmorphone (DILAUDID) injection 0.5 mg  0.5 mg Intravenous Once Blanchie Dessert, MD   Stopped at 02/18/19 1559  . insulin aspart (novoLOG) injection 0-9 Units  0-9 Units Subcutaneous TID WC Patel, Vishal R, MD      . insulin glargine (LANTUS) injection 5 Units  5 Units Subcutaneous QHS Lenore Cordia, MD   5 Units at 02/18/19 2253  . MEDLINE mouth rinse  15 mL Mouth Rinse BID Justin Mend, MD      . multivitamin (RENA-VIT) tablet 1 tablet  1 tablet Oral QHS Lenore Cordia, MD   1 tablet at 02/18/19 2221  . ondansetron (ZOFRAN) tablet 4 mg  4 mg Oral Q6H PRN Lenore Cordia, MD       Or  . ondansetron (ZOFRAN) injection 4 mg  4 mg Intravenous Q6H PRN Lenore Cordia, MD   4 mg at 02/19/19 1601  . oxyCODONE (Oxy IR/ROXICODONE) immediate release tablet 5 mg  5 mg Oral Q6H PRN Lenore Cordia, MD   5 mg at 02/19/19 0906  . pantoprazole (PROTONIX) EC tablet 40 mg  40 mg Oral Daily Patel, Vishal R, MD      . sodium chloride flush (NS) 0.9 % injection 3 mL  3  mL Intravenous Q12H  Lenore Cordia, MD   3 mL at 02/18/19 2222   Labs: Basic Metabolic Panel: Recent Labs  Lab 02/18/19 1724 02/19/19 0639  NA 134* 138  K 6.1* 5.7*  CL 99 99  CO2 25 26  GLUCOSE 99 101*  BUN 24* 31*  CREATININE 7.26* 8.34*  CALCIUM 8.8* 8.9  PHOS  --  5.0*   Liver Function Tests: Recent Labs  Lab 02/18/19 1724 02/19/19 0639  AST 75*  --   ALT 20  --   ALKPHOS 102  --   BILITOT 1.5*  --   PROT 7.0  --   ALBUMIN 2.1* 2.0*   No results for input(s): LIPASE, AMYLASE in the last 168 hours. No results for input(s): AMMONIA in the last 168 hours. CBC: Recent Labs  Lab 02/18/19 1244 02/19/19 0639  WBC 13.6* 16.0*  NEUTROABS 9.7*  --   HGB 8.2* 7.6*  HCT 27.0* 24.6*  MCV 75.0* 74.3*  PLT 287 258   Cardiac Enzymes: Recent Labs  Lab 02/18/19 1230  TROPONINI 0.04*   CBG: Recent Labs  Lab 02/18/19 2028 02/18/19 2131 02/19/19 0700  GLUCAP 108* 109* 101*   Iron Studies: No results for input(s): IRON, TIBC, TRANSFERRIN, FERRITIN in the last 72 hours. Studies/Results: Dg Chest Port 1 View  Result Date: 02/18/2019 CLINICAL DATA:  Chest pain and shortness of breath EXAM: PORTABLE CHEST 1 VIEW COMPARISON:  02/05/2019 FINDINGS: Cardiac shadow remains enlarged. Aortic calcifications are again seen. Dialysis catheter is again noted on the right. Diffuse alveolar edema is noted similar to that seen on the prior exam. Some more focal scarring in right base is noted. No bony abnormality is noted. IMPRESSION: Changes consistent with CHF similar to that seen on the prior plain film. Electronically Signed   By: Inez Catalina M.D.   On: 02/18/2019 13:30    ROS: As per HPI otherwise negative.  Physical Exam: Vitals:   02/18/19 2132 02/19/19 0518 02/19/19 0616 02/19/19 0843  BP: 132/79 131/72  (!) 151/63  Pulse: (!) 119 98  79  Resp: 20 20  18   Temp: 98.2 F (36.8 C)  98.6 F (37 C) 99.1 F (37.3 C)  TempSrc:   Oral Oral  SpO2: 98% 90%  94%  Weight:  53.1 kg     Height:         General: WDWN NAD breathing easily at rest with [place] O2 Head: NCAT sclera not icteric MMM Neck: Supple.  Lungs:  Soft exp rhonchi - difficult to hear due to constant talking. Breathing is unlabored. Heart: RRR - pain on palpation of chest - no appreciable rub Abdomen: soft lower abdominal and epigastric pain on exam + BS Lower extremities: without edema Neuro: A & O  X 3. Moves all extremities spontaneously. = grips, CN grossly intact- + asterxis Psych:  Anxious tearful Dialysis Access: AVGG right and right IJ TDC  Dialysis Orders: TTS [facility] 4 hr 400/800 EDW 54 2 K 2.5 Ca right forearm AVGG TDC heparin 3K Hectorol 4 Mircera 225 q 2 wk - last 6/4  venofer 100 through 6/16  Recent labs tsat 6% hgb 8.7 6/4   Assessment/Plan: 1. Hyperkalemia - due to under dialysis from chronic shortened dialysis 2. Pulmonary edema - excess volume due to shortened HD treatments and high IDWG, serial HD to lower 3. ESRD -  TTS - HD today - encourage her to run full treatment- we will set for 3 hr today and tomorrow; seems to have some mild  asterixis on exam. Agrees to use AVGG with spray today - not clear why not using at outpt HD - had been using at one point - difficult to get history from her 4. Hypertension/volume  - titrate down with HD - encourage to stay on treatment - if not dc/ today - will dialyze tomorrow as well  to lower volume. 5.  Fe deficiency anemia  - hgb down to 7.6  From 8.7 6/4  has VERY low Fe stores (tsat 6% 6/2 down from 15 % mid May even in the setting of Fe repletion - positive stools in March - will repeat FOBT - extend course of IV Fe at discharge - has been on Max ESA  -last dose 6/4; if stool + given epigastric pain - may need EGD on PPI hold heparin for now pending FOBT 6.  Metabolic bone disease -  Continue hectorol 4 7.  Nutrition - renal carb mod/vit 8.  DM 9.  Hx CVA 10.  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 Vaughan Browner  per last d/c COIVD Neg  12. Fever - BC pending - Abtx per primary   Myriam Jacobson, PA-C [place] [phone] 02/19/2019, 10:25 AM   Pt seen, examined and agree w A/P as above.  Kelly Splinter  MD 02/19/2019, 12:51 PM

## 2019-02-19 NOTE — Progress Notes (Signed)
Renal Navigator notified OP HD clinic/[facility] of patient's admission and negative COVID 19 rapid test result in order to provide continuity of care and safety.  Alphonzo Cruise, [place] Renal Navigator [phone]

## 2019-02-19 NOTE — Progress Notes (Signed)
PROGRESS NOTE    SHAMELA HAYDON  WJX:[id number] DOB: Jan 25, 1956 DOA: 02/18/2019 PCP: Clent Demark, PA-C   Brief Narrative: Rhonda Lynch is a 63 y.o. 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 Patient presented with shortness of breath and found to have acute pulmonary edema secondary to HD non-adherence.    Assessment & Plan:   Active Problems:   ESRD on dialysis [facility])   Pulmonary edema   Insulin dependent diabetes mellitus (HCC)   Hyperkalemia   History of CVA (cerebrovascular accident)   Hyperkalemia Acute pulmonary edema Secondary to non-adherence with outpatient HD. Symptoms have improved from yesterday. Potassium improved from yesterday with Lokelma. Plan for HD per nephrology -Nephrology recommendations  Fever SIRS Present on admission. No source. Upon ROS, patient denies any symptoms other than shortness of breath which is likely secondary to pulmonary edema. Elevated WBC. Blood cultures obtained. Received one dose of Vancomycin/cefepime/metronidazole in the ED. MRSA pcr is negative. -Continue to hold antibiotics for now and watch fever curve and culture data -Low threshold to restart antibiotics  Diabetes mellitus Insulin dependent. -Continue Lantus and SSI  Anemia of chronic disease Likely secondary to kidney disease. Baseline appears to be between 7.5-8. Currently baseline.  ESRD TTS scheduled. Management above  History of CVA -Continue aspirin  History of BOOP Outpatient follow-up  GERD -Continue Protonix   DVT prophylaxis: Heparin Code Status:   Code Status: Full Code Family Communication: None Disposition Plan: Discharge   Consultants:   Nephrology  Procedures:   HD  Antimicrobials:  Vancomycin  Cefepime  Metronidazole    Subjective: Feels  bad. Dyspnea is improved from yesterday but not resolved.  Objective: Vitals:   02/18/19 2132 02/19/19 0518 02/19/19 0616 02/19/19 0843  BP: 132/79 131/72  (!) 151/63  Pulse: (!) 119 98  79  Resp: 20 20  18   Temp: 98.2 F (36.8 C)  98.6 F (37 C) 99.1 F (37.3 C)  TempSrc:   Oral Oral  SpO2: 98% 90%  94%  Weight: 53.1 kg     Height:        Intake/Output Summary (Last 24 hours) at 02/19/2019 1327 Last data filed at 02/19/2019 0900 Gross per 24 hour  Intake 580 ml  Output 0 ml  Net 580 ml   Filed Weights   02/18/19 1153 02/18/19 2132  Weight: 53.3 kg 53.1 kg    Examination:  General exam: Appears calm and comfortable. Rocking front-back Respiratory system: Clear to auscultation. Respiratory effort normal. Cardiovascular system: S1 & S2 heard, RRR. No murmurs, rubs, gallops or clicks. Gastrointestinal system: Abdomen is nondistended, soft and nontender. No organomegaly or masses felt. Normal bowel sounds heard. Central nervous system: Alert and oriented. No focal neurological deficits. Extremities: No edema. No calf tenderness Skin: No cyanosis. No rashes Psychiatry: Judgement and insight appear normal. Withdrawn. Flat affect    Data Reviewed: I have personally reviewed following labs and imaging studies  CBC: Recent Labs  Lab 02/18/19 1244 02/19/19 0639  WBC 13.6* 16.0*  NEUTROABS 9.7*  --   HGB 8.2* 7.6*  HCT 27.0* 24.6*  MCV 75.0* 74.3*  PLT 287 213   Basic Metabolic Panel: Recent Labs  Lab 02/18/19 1724 02/19/19 0639  NA 134* 138  K 6.1* 5.7*  CL 99 99  CO2 25 26  GLUCOSE 99 101*  BUN 24* 31*  CREATININE 7.26*  8.34*  CALCIUM 8.8* 8.9  PHOS  --  5.0*   GFR: Estimated Creatinine Clearance: 5.1 mL/min (A) (by C-G formula based on SCr of 8.34 mg/dL (H)). Liver Function Tests: Recent Labs  Lab 02/18/19 1724 02/19/19 0639  AST 75*  --   ALT 20  --   ALKPHOS 102  --   BILITOT 1.5*  --   PROT 7.0  --   ALBUMIN 2.1* 2.0*   No results for  input(s): LIPASE, AMYLASE in the last 168 hours. No results for input(s): AMMONIA in the last 168 hours. Coagulation Profile: No results for input(s): INR, PROTIME in the last 168 hours. Cardiac Enzymes: Recent Labs  Lab 02/18/19 1230  TROPONINI 0.04*   BNP (last 3 results) No results for input(s): PROBNP in the last 8760 hours. HbA1C: No results for input(s): HGBA1C in the last 72 hours. CBG: Recent Labs  Lab 02/18/19 2028 02/18/19 2131 02/19/19 0700 02/19/19 1136  GLUCAP 108* 109* 101* 161*   Lipid Profile: No results for input(s): CHOL, HDL, LDLCALC, TRIG, CHOLHDL, LDLDIRECT in the last 72 hours. Thyroid Function Tests: No results for input(s): TSH, T4TOTAL, FREET4, T3FREE, THYROIDAB in the last 72 hours. Anemia Panel: No results for input(s): VITAMINB12, FOLATE, FERRITIN, TIBC, IRON, RETICCTPCT in the last 72 hours. Sepsis Labs: Recent Labs  Lab 02/18/19 1213  LATICACIDVEN 1.5    Recent Results (from the past 240 hour(s))  Blood Culture (routine x 2)     Status: None (Preliminary result)   Collection Time: 02/18/19 12:13 PM   Specimen: BLOOD  Result Value Ref Range Status   Specimen Description BLOOD SITE NOT SPECIFIED  Final   Special Requests   Final    BOTTLES DRAWN AEROBIC AND ANAEROBIC Blood Culture adequate volume   Culture   Final    NO GROWTH < 24 HOURS Performed at [facility], [place] [address] [place], [place] [address]    Report Status PENDING  Incomplete  Blood Culture (routine x 2)     Status: None (Preliminary result)   Collection Time: 02/18/19 12:18 PM   Specimen: BLOOD  Result Value Ref Range Status   Specimen Description BLOOD SITE NOT SPECIFIED  Final   Special Requests   Final    BOTTLES DRAWN AEROBIC ONLY Blood Culture results may not be optimal due to an inadequate volume of blood received in culture bottles   Culture   Final    NO GROWTH < 24 HOURS Performed at [facility], [place] [address], [place], [place] [address]     Report Status PENDING  Incomplete  SARS Coronavirus 2     Status: None   Collection Time: 02/18/19 12:44 PM  Result Value Ref Range Status   SARS Coronavirus 2 NOT DETECTED NOT DETECTED Final    Comment: (NOTE) SARS-CoV-2 target nucleic acids are NOT DETECTED. The SARS-CoV-2 RNA is generally detectable in upper and lower respiratory specimens during the acute phase of infection.  Negative  results do not preclude SARS-CoV-2 infection, do not rule out co-infections with other pathogens, and should not be used as the sole basis for treatment or other patient management decisions.  Negative results must be combined with clinical observations, patient history, and epidemiological information. The expected result is Not Detected. Fact Sheet for Patients: http://www.biofiredefense.com/wp-content/uploads/2020/03/BIOFIRE-COVID -19-patients.pdf Fact Sheet for Healthcare Providers: http://www.biofiredefense.com/wp-content/uploads/2020/03/BIOFIRE-COVID -19-hcp.pdf This test is not yet approved or cleared by the [place] and  has been authorized for detection and/or diagnosis of SARS-CoV-2  by FDA under an Emergency Use Authorization (EUA).  This EUA will remain in effec t (meaning this test can be used) for the duration of  the COVID-19 declaration under Section 564(b)(1) of the Act, 21 U.S.C. section 360bbb-3(b)(1), unless the authorization is terminated or revoked sooner. Performed at [facility], [place] [address], [place], [place] [address]   MRSA PCR Screening     Status: None   Collection Time: 02/19/19 10:20 AM   Specimen: Nasal Mucosa; Nasopharyngeal  Result Value Ref Range Status   MRSA by PCR NEGATIVE NEGATIVE Final    Comment:        The GeneXpert MRSA Assay (FDA approved for NASAL specimens only), is one component of a comprehensive MRSA colonization surveillance program. It is not intended to diagnose MRSA infection nor to guide or monitor treatment for  MRSA infections. Performed at [facility], [place] [address], [place], [place] [address]          Radiology Studies: Dg Chest Port 1 View  Result Date: 02/18/2019 CLINICAL DATA:  Chest pain and shortness of breath EXAM: PORTABLE CHEST 1 VIEW COMPARISON:  02/05/2019 FINDINGS: Cardiac shadow remains enlarged. Aortic calcifications are again seen. Dialysis catheter is again noted on the right. Diffuse alveolar edema is noted similar to that seen on the prior exam. Some more focal scarring in right base is noted. No bony abnormality is noted. IMPRESSION: Changes consistent with CHF similar to that seen on the prior plain film. Electronically Signed   By: Inez Catalina M.D.   On: 02/18/2019 13:30        Scheduled Meds: . amLODipine  5 mg Oral QHS  . aspirin EC  81 mg Oral Daily  . Chlorhexidine Gluconate Cloth  6 each Topical Q0600  . Chlorhexidine Gluconate Cloth  6 each Topical Q0600  . doxercalciferol  4 mcg Intravenous Q T,Th,Sa-HD  . gabapentin  100 mg Oral TID  . heparin  5,000 Units Subcutaneous Q8H  .  HYDROmorphone (DILAUDID) injection  0.5 mg Intravenous Once  . insulin aspart  0-9 Units Subcutaneous TID WC  . insulin glargine  5 Units Subcutaneous QHS  . mouth rinse  15 mL Mouth Rinse BID  . multivitamin  1 tablet Oral QHS  . pantoprazole  40 mg Oral Daily  . sodium chloride flush  3 mL Intravenous Q12H   Continuous Infusions: . ferric gluconate (FERRLECIT/NULECIT) IV       LOS: 0 days     Cordelia Poche, MD [facility] 02/19/2019, 1:27 PM  If 7PM-7AM, please contact night-coverage [email or web address]

## 2019-02-20 ENCOUNTER — Inpatient Hospital Stay (HOSPITAL_COMMUNITY): Payer: Medicare Other

## 2019-02-20 ENCOUNTER — Encounter (HOSPITAL_COMMUNITY): Payer: Self-pay | Admitting: Interventional Radiology

## 2019-02-20 HISTORY — PX: IR US GUIDE VASC ACCESS RIGHT: IMG2390

## 2019-02-20 HISTORY — PX: IR THROMBECTOMY AV FISTULA W/THROMBOLYSIS INC/SHUNT/IMG RIGHT: IMG6118

## 2019-02-20 LAB — CBC
HCT: 24.3 % — ABNORMAL LOW (ref 36.0–46.0)
Hemoglobin: 7.4 g/dL — ABNORMAL LOW (ref 12.0–15.0)
MCH: 22.8 pg — ABNORMAL LOW (ref 26.0–34.0)
MCHC: 30.5 g/dL (ref 30.0–36.0)
MCV: 75 fL — ABNORMAL LOW (ref 80.0–100.0)
Platelets: 215 10*3/uL (ref 150–400)
RBC: 3.24 MIL/uL — ABNORMAL LOW (ref 3.87–5.11)
RDW: 23.6 % — ABNORMAL HIGH (ref 11.5–15.5)
WBC: 10.6 10*3/uL — ABNORMAL HIGH (ref 4.0–10.5)
nRBC: 0.6 % — ABNORMAL HIGH (ref 0.0–0.2)

## 2019-02-20 LAB — BASIC METABOLIC PANEL
Anion gap: 10 (ref 5–15)
BUN: 24 mg/dL — ABNORMAL HIGH (ref 8–23)
CO2: 26 mmol/L (ref 22–32)
Calcium: 8.7 mg/dL — ABNORMAL LOW (ref 8.9–10.3)
Chloride: 100 mmol/L (ref 98–111)
Creatinine, Ser: 6.38 mg/dL — ABNORMAL HIGH (ref 0.44–1.00)
GFR calc Af Amer: 7 mL/min — ABNORMAL LOW (ref >60)
GFR calc non Af Amer: 6 mL/min — ABNORMAL LOW (ref >60)
Glucose, Bld: 109 mg/dL — ABNORMAL HIGH (ref 70–99)
Potassium: 4.1 mmol/L (ref 3.5–5.1)
Sodium: 136 mmol/L (ref 135–145)

## 2019-02-20 LAB — PROTIME-INR
INR: 1.1 (ref 0.8–1.2)
Prothrombin Time: 14.5 seconds (ref 11.4–15.2)

## 2019-02-20 LAB — GLUCOSE, CAPILLARY
Glucose-Capillary: 87 mg/dL (ref 70–99)
Glucose-Capillary: 125 mg/dL — ABNORMAL HIGH (ref 70–99)
Glucose-Capillary: 82 mg/dL (ref 70–99)
Glucose-Capillary: 156 mg/dL — ABNORMAL HIGH (ref 70–99)

## 2019-02-20 MED ORDER — FENTANYL CITRATE (PF) 100 MCG/2ML IJ SOLN
INTRAMUSCULAR | Status: AC | PRN
  Administered 2019-02-20: 50 ug via INTRAVENOUS

## 2019-02-20 MED ORDER — MIDAZOLAM HCL 2 MG/2ML IJ SOLN
INTRAMUSCULAR | Status: AC | PRN
  Administered 2019-02-20 (×2): 1 mg via INTRAVENOUS

## 2019-02-20 MED ORDER — MIDAZOLAM HCL 2 MG/2ML IJ SOLN
INTRAMUSCULAR | Status: AC
  Filled 2019-02-20: qty 2

## 2019-02-20 MED ORDER — IOHEXOL 300 MG/ML  SOLN
100.0000 mL | Freq: Once | INTRAMUSCULAR | Status: AC | PRN
  Administered 2019-02-20: 100 mL via INTRAVENOUS

## 2019-02-20 MED ORDER — HEPARIN SODIUM (PORCINE) 1000 UNIT/ML IJ SOLN
INTRAMUSCULAR | Status: AC
  Filled 2019-02-20: qty 1

## 2019-02-20 MED ORDER — ALTEPLASE 2 MG IJ SOLR
INTRAMUSCULAR | Status: AC
  Filled 2019-02-20: qty 2

## 2019-02-20 MED ORDER — FENTANYL CITRATE (PF) 100 MCG/2ML IJ SOLN
INTRAMUSCULAR | Status: AC
  Filled 2019-02-20: qty 2

## 2019-02-20 MED ORDER — SODIUM CHLORIDE 0.9 % IV SOLN
INTRAVENOUS | Status: AC | PRN
  Administered 2019-02-20: 10 mL/h via INTRAVENOUS

## 2019-02-20 MED ORDER — LIDOCAINE HCL 1 % IJ SOLN
INTRAMUSCULAR | Status: AC | PRN
  Administered 2019-02-20: 10 mL

## 2019-02-20 MED ORDER — LIDOCAINE HCL 1 % IJ SOLN
INTRAMUSCULAR | Status: AC
  Filled 2019-02-20: qty 20

## 2019-02-20 MED ORDER — HEPARIN SODIUM (PORCINE) 1000 UNIT/ML IJ SOLN
INTRAMUSCULAR | Status: AC | PRN
  Administered 2019-02-20: 3000 [IU] via INTRAVENOUS

## 2019-02-20 NOTE — Progress Notes (Signed)
Patient refused for me to call her son, she said she will give him a call and update him.

## 2019-02-20 NOTE — Plan of Care (Signed)
  Problem: Education: Goal: Knowledge of General Education information will improve Description: Including pain rating scale, medication(s)/side effects and non-pharmacologic comfort measures Outcome: Adequate for Discharge   Problem: Health Behavior/Discharge Planning: Goal: Ability to manage health-related needs will improve Outcome: Adequate for Discharge   Problem: Clinical Measurements: Goal: Ability to maintain clinical measurements within normal limits will improve Outcome: Adequate for Discharge Goal: Will remain free from infection Outcome: Adequate for Discharge Goal: Diagnostic test results will improve Outcome: Adequate for Discharge   Problem: Activity: Goal: Risk for activity intolerance will decrease Outcome: Adequate for Discharge   Problem: Nutrition: Goal: Adequate nutrition will be maintained Outcome: Adequate for Discharge   Problem: Coping: Goal: Level of anxiety will decrease Outcome: Adequate for Discharge   Problem: Elimination: Goal: Will not experience complications related to bowel motility Outcome: Adequate for Discharge Goal: Will not experience complications related to urinary retention Outcome: Adequate for Discharge   Problem: Skin Integrity: Goal: Risk for impaired skin integrity will decrease Outcome: Adequate for Discharge   Problem: Education: Goal: Knowledge of disease and its progression will improve Outcome: Adequate for Discharge   Problem: Health Behavior/Discharge Planning: Goal: Ability to manage health-related needs will improve Outcome: Adequate for Discharge

## 2019-02-20 NOTE — Procedures (Signed)
Esrd, clotted avg  S/P DECLOT RT FA LOOP GRAFT  No comp Stable ebl min Full report in pacs

## 2019-02-20 NOTE — Plan of Care (Signed)
  Problem: Education: Goal: Knowledge of disease and its progression will improve Outcome: Adequate for Discharge   

## 2019-02-20 NOTE — Progress Notes (Addendum)
[facility] Progress Note   Dialysis Orders: TTS [place] 4 hr 400/800 EDW 54 2 K 2.5 Ca right forearm AVGG TDC heparin 3K Hectorol 4 Mircera 225 q 2 wk - last 6/4  venofer 100 through 6/16  Recent labs tsat 6% hgb 8.7 6/4   Assessment/Plan: 1. Hyperkalemia - due to under dialysis from chronic shortened dialysis- corrected to 4.1 today 2. Pulmonary edema - excess volume due to shortened HD treatments and high IDWG, titrate down 3. ESRD -  TTS - next HD Saturday - encourage her to run full treatment- ran 3 hr today Wed; seems to have some mild asterixis on exam Thursday - better today. Used AVGG (placed 5/11)  with spray -first use  - CLOTTED today - discussed with Dr. Trula Slade - rec IR to do thrombolysis = will order; fortunately can use back up [facility]; did not have BP drop during HD- while time is 4 hr - will set for 3.5 hr for better chance of running full treatment with shorter time. 4.  Hypertension/volume  BP much improved; titrate down with HD - encourage to stay on treatment - net UF 2.5 L Thursday; still above edw but due to high volume of dialysis census today,we will not do an extra treatment for volume today - 5.  Fe deficiency anemia  - hgb down to 7.6 > 7.4  From 8.7 6/4  has VERY low Fe stores (tsat 6% 6/2 down from 15 % mid May even in the setting of Fe repletion - positive stools in March - will repeat FOBT - extend course of IV Fe at discharge - has been on Max ESA  -last dose 6/4; if stool + given epigastric pain - may need EGD on PPI hold heparin for now pending FOBT - 6.  Metabolic bone disease -  Continue hectorol 4 P ok 7.  Nutrition - renal carb mod/vit 8.  DM 9.  Hx CVA 10.  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 Vaughan Browner per last d/c COIVD Neg  12. Fever - BC pending - Abtx per primary, COVID negative 13. Hx of dialysis and medical noncompliance though seems to be making some improvement in this regard.  Currently lives alone in a  motel.    Myriam Jacobson, PA-C [place] 02/20/2019,8:18 AM  LOS: 1 day   Pt seen, examined and agree w A/P as above.  Kelly Splinter  MD 02/20/2019, 11:10 AM    Subjective:   Speech better today. Ate most of breakfast.  Upset that [place] is clotted  Objective Vitals:   02/19/19 1705 02/19/19 2115 02/19/19 2119 02/20/19 0511  BP: (!) 141/80  130/70 116/66  Pulse: 92  97 87  Resp: 18  20 18   Temp: 98.3 F (36.8 C)  98.6 F (37 C) 99.3 F (37.4 C)  TempSrc: Oral  Oral Oral  SpO2: 91% (!) 85% 92% 91%  Weight:   55.5 kg   Height:       Physical Exam General: breathing easily  Heart: RRR Lungs: grossly clear Abdomen: soft NT Extremities: no LE edema Dialysis Access: right lower AVGG no bruit or thrill mild right arm edema; right IJ [facility]   Additional Objective Labs: Basic Metabolic Panel: Recent Labs  Lab 02/18/19 1724 02/19/19 0639 02/20/19 0524  NA 134* 138 136  K 6.1* 5.7* 4.1  CL 99 99 100  CO2 25 26 26   GLUCOSE 99 101*  109*  BUN 24* 31* 24*  CREATININE 7.26* 8.34* 6.38*  CALCIUM 8.8* 8.9 8.7*  PHOS  --  5.0*  --    Liver Function Tests: Recent Labs  Lab 02/18/19 1724 02/19/19 0639  AST 75*  --   ALT 20  --   ALKPHOS 102  --   BILITOT 1.5*  --   PROT 7.0  --   ALBUMIN 2.1* 2.0*   No results for input(s): LIPASE, AMYLASE in the last 168 hours. CBC: Recent Labs  Lab 02/18/19 1244 02/19/19 0639 02/20/19 0524  WBC 13.6* 16.0* 10.6*  NEUTROABS 9.7*  --   --   HGB 8.2* 7.6* 7.4*  HCT 27.0* 24.6* 24.3*  MCV 75.0* 74.3* 75.0*  PLT 287 258 215   Blood Culture    Component Value Date/Time   SDES BLOOD SITE NOT SPECIFIED 02/18/2019 1218   SPECREQUEST  02/18/2019 1218    BOTTLES DRAWN AEROBIC ONLY Blood Culture results may not be optimal due to an inadequate volume of blood received in culture bottles   CULT  02/18/2019 1218    NO GROWTH 1 DAY Performed at [facility] [address], [place], [place]  [address]    REPTSTATUS PENDING 02/18/2019 1218    Cardiac Enzymes: Recent Labs  Lab 02/18/19 1230  TROPONINI 0.04*   CBG: Recent Labs  Lab 02/19/19 0700 02/19/19 1136 02/19/19 1701 02/19/19 2118 02/20/19 0655  GLUCAP 101* 161* 93 152* 87   Iron Studies: No results for input(s): IRON, TIBC, TRANSFERRIN, FERRITIN in the last 72 hours. Lab Results  Component Value Date   INR 1.1 01/19/2019   INR 1.0 01/14/2019   INR 1.2 01/11/2019   Studies/Results: Dg Chest Port 1 View  Result Date: 02/18/2019 CLINICAL DATA:  Chest pain and shortness of breath EXAM: PORTABLE CHEST 1 VIEW COMPARISON:  02/05/2019 FINDINGS: Cardiac shadow remains enlarged. Aortic calcifications are again seen. Dialysis catheter is again noted on the right. Diffuse alveolar edema is noted similar to that seen on the prior exam. Some more focal scarring in right base is noted. No bony abnormality is noted. IMPRESSION: Changes consistent with CHF similar to that seen on the prior plain film. Electronically Signed   By: Inez Catalina M.D.   On: 02/18/2019 13:30   Medications: . ferric gluconate (FERRLECIT/NULECIT) IV 125 mg (02/19/19 1535)   . amLODipine  5 mg Oral QHS  . aspirin EC  81 mg Oral Daily  . Chlorhexidine Gluconate Cloth  6 each Topical Q0600  . Chlorhexidine Gluconate Cloth  6 each Topical Q0600  . doxercalciferol  4 mcg Intravenous Q T,Th,Sa-HD  . gabapentin  100 mg Oral TID  . heparin  5,000 Units Subcutaneous Q8H  .  HYDROmorphone (DILAUDID) injection  0.5 mg Intravenous Once  . insulin aspart  0-9 Units Subcutaneous TID WC  . insulin glargine  5 Units Subcutaneous QHS  . mouth rinse  15 mL Mouth Rinse BID  . multivitamin  1 tablet Oral QHS  . pantoprazole  40 mg Oral Daily  . sodium chloride flush  3 mL Intravenous Q12H

## 2019-02-20 NOTE — Progress Notes (Addendum)
PROGRESS NOTE    Rhonda Lynch  IRS:[id number] DOB: [birth year] DOA: 02/18/2019 PCP: Clent Demark, PA-C   Brief Narrative: Rhonda Lynch is a 63 y.o. 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 Patient presented with shortness of breath and found to have acute pulmonary edema secondary to HD non-adherence.    Assessment & Plan:   Active Problems:   ESRD on dialysis [facility])   Pulmonary edema   Insulin dependent diabetes mellitus (HCC)   Hyperkalemia   History of CVA (cerebrovascular accident)   Hyperkalemia Acute pulmonary edema Secondary to non-adherence with outpatient HD. Symptoms have improved from yesterday. Potassium improved from yesterday with Lokelma. Plan for HD per nephrology. Fistula is not functioning -Nephrology recommendations: IR consult for fistula  Fever SIRS Present on admission. No source. Upon ROS, patient denies any symptoms other than shortness of breath which is likely secondary to pulmonary edema. Elevated WBC. Blood cultures obtained. Received one dose of Vancomycin/cefepime/metronidazole in the ED. MRSA pcr is negative. Fever curve has remained good without antibiotics. WBC stable. -Continue to hold antibiotics for now and watch fever curve and culture data  Diabetes mellitus Insulin dependent. -Continue Lantus and SSI  Anemia of chronic disease Likely secondary to kidney disease. Baseline appears to be between 7.5-8. Currently baseline.  ESRD TTS scheduled. Management above  History of CVA -Continue aspirin  History of BOOP Outpatient follow-up  GERD -Continue Protonix   DVT prophylaxis: Heparin Code Status:   Code Status: Full Code Family Communication: None Disposition Plan: Discharge pending nephrology recommendations; likely home in 24  hours   Consultants:   Nephrology  Procedures:   HD  Antimicrobials:  Vancomycin  Cefepime  Metronidazole    Subjective: Feeling better than yesterday. Worried about her fistula.  Objective: Vitals:   02/19/19 2115 02/19/19 2119 02/20/19 0511 02/20/19 0859  BP:  130/70 116/66 129/66  Pulse:  97 87 90  Resp:  20 18 18   Temp:  98.6 F (37 C) 99.3 F (37.4 C) 98.6 F (37 C)  TempSrc:  Oral Oral Oral  SpO2: (!) 85% 92% 91% 96%  Weight:  55.5 kg    Height:        Intake/Output Summary (Last 24 hours) at 02/20/2019 1224 Last data filed at 02/20/2019 0900 Gross per 24 hour  Intake 590 ml  Output 2500 ml  Net -1910 ml   Filed Weights   02/19/19 1250 02/19/19 1554 02/19/19 2119  Weight: 58.7 kg 56.3 kg 55.5 kg    Examination:  General exam: Appears calm and comfortable Respiratory system: Rales. Respiratory effort normal. Cardiovascular system: S1 & S2 heard, RRR. No murmurs, rubs, gallops or clicks. Right forearm fistula without thrill or bruit Gastrointestinal system: Abdomen is nondistended, soft and nontender. No organomegaly or masses felt. Normal bowel sounds heard. Central nervous system: Alert and oriented. No focal neurological deficits. Extremities: No edema. No calf tenderness Skin: No cyanosis. No rashes Psychiatry: Judgement and insight appear normal. Mood & affect appropriate.     Data Reviewed: I have personally reviewed following labs and imaging studies  CBC: Recent Labs  Lab 02/18/19 1244 02/19/19 0639 02/20/19 0524  WBC 13.6* 16.0* 10.6*  NEUTROABS 9.7*  --   --   HGB 8.2* 7.6* 7.4*  HCT 27.0* 24.6* 24.3*  MCV 75.0* 74.3* 75.0*  PLT 287 258 009   Basic Metabolic  Panel: Recent Labs  Lab 02/18/19 1724 02/19/19 0639 02/20/19 0524  NA 134* 138 136  K 6.1* 5.7* 4.1  CL 99 99 100  CO2 25 26 26   GLUCOSE 99 101* 109*  BUN 24* 31* 24*  CREATININE 7.26* 8.34* 6.38*  CALCIUM 8.8* 8.9 8.7*  PHOS  --  5.0*  --     GFR: Estimated Creatinine Clearance: 6.6 mL/min (A) (by C-G formula based on SCr of 6.38 mg/dL (H)). Liver Function Tests: Recent Labs  Lab 02/18/19 1724 02/19/19 0639  AST 75*  --   ALT 20  --   ALKPHOS 102  --   BILITOT 1.5*  --   PROT 7.0  --   ALBUMIN 2.1* 2.0*   No results for input(s): LIPASE, AMYLASE in the last 168 hours. No results for input(s): AMMONIA in the last 168 hours. Coagulation Profile: No results for input(s): INR, PROTIME in the last 168 hours. Cardiac Enzymes: Recent Labs  Lab 02/18/19 1230  TROPONINI 0.04*   BNP (last 3 results) No results for input(s): PROBNP in the last 8760 hours. HbA1C: No results for input(s): HGBA1C in the last 72 hours. CBG: Recent Labs  Lab 02/19/19 1136 02/19/19 1701 02/19/19 2118 02/20/19 0655 02/20/19 1105  GLUCAP 161* 93 152* 87 125*   Lipid Profile: No results for input(s): CHOL, HDL, LDLCALC, TRIG, CHOLHDL, LDLDIRECT in the last 72 hours. Thyroid Function Tests: No results for input(s): TSH, T4TOTAL, FREET4, T3FREE, THYROIDAB in the last 72 hours. Anemia Panel: No results for input(s): VITAMINB12, FOLATE, FERRITIN, TIBC, IRON, RETICCTPCT in the last 72 hours. Sepsis Labs: Recent Labs  Lab 02/18/19 1213  LATICACIDVEN 1.5    Recent Results (from the past 240 hour(s))  Blood Culture (routine x 2)     Status: None (Preliminary result)   Collection Time: 02/18/19 12:13 PM   Specimen: BLOOD  Result Value Ref Range Status   Specimen Description BLOOD SITE NOT SPECIFIED  Final   Special Requests   Final    BOTTLES DRAWN AEROBIC AND ANAEROBIC Blood Culture adequate volume   Culture   Final    NO GROWTH 2 DAYS Performed at [facility], [address] [place], [place] [address]    Report Status PENDING  Incomplete  Blood Culture (routine x 2)     Status: None (Preliminary result)   Collection Time: 02/18/19 12:18 PM   Specimen: BLOOD  Result Value Ref Range Status   Specimen Description BLOOD  SITE NOT SPECIFIED  Final   Special Requests   Final    BOTTLES DRAWN AEROBIC ONLY Blood Culture results may not be optimal due to an inadequate volume of blood received in culture bottles   Culture   Final    NO GROWTH 2 DAYS Performed at [facility], [place] [address], [place], [place] [address]    Report Status PENDING  Incomplete  SARS Coronavirus 2     Status: None   Collection Time: 02/18/19 12:44 PM  Result Value Ref Range Status   SARS Coronavirus 2 NOT DETECTED NOT DETECTED Final    Comment: (NOTE) SARS-CoV-2 target nucleic acids are NOT DETECTED. The SARS-CoV-2 RNA is generally detectable in upper and lower respiratory specimens during the acute phase of infection.  Negative  results do not preclude SARS-CoV-2 infection, do not rule out co-infections with other pathogens, and should not be used as the sole basis for treatment or other patient management decisions.  Negative results must be combined with  clinical observations, patient history, and epidemiological information. The expected result is Not Detected. Fact Sheet for Patients: http://www.biofiredefense.com/wp-content/uploads/2020/03/BIOFIRE-COVID -19-patients.pdf Fact Sheet for Healthcare Providers: http://www.biofiredefense.com/wp-content/uploads/2020/03/BIOFIRE-COVID -19-hcp.pdf This test is not yet approved or cleared by the [place] and  has been authorized for detection and/or diagnosis of SARS-CoV-2 by FDA under an Emergency Use Authorization (EUA).  This EUA will remain in effec t (meaning this test can be used) for the duration of  the COVID-19 declaration under Section 564(b)(1) of the Act, 21 U.S.C. section 360bbb-3(b)(1), unless the authorization is terminated or revoked sooner. Performed at [facility], [place] [address] [place], [place] [address]   MRSA PCR Screening     Status: None   Collection Time: 02/19/19 10:20 AM   Specimen: Nasal Mucosa; Nasopharyngeal  Result Value  Ref Range Status   MRSA by PCR NEGATIVE NEGATIVE Final    Comment:        The GeneXpert MRSA Assay (FDA approved for NASAL specimens only), is one component of a comprehensive MRSA colonization surveillance program. It is not intended to diagnose MRSA infection nor to guide or monitor treatment for MRSA infections. Performed at [facility], [place] [address], [place], [place] [address]          Radiology Studies: Dg Chest Port 1 View  Result Date: 02/18/2019 CLINICAL DATA:  Chest pain and shortness of breath EXAM: PORTABLE CHEST 1 VIEW COMPARISON:  02/05/2019 FINDINGS: Cardiac shadow remains enlarged. Aortic calcifications are again seen. Dialysis catheter is again noted on the right. Diffuse alveolar edema is noted similar to that seen on the prior exam. Some more focal scarring in right base is noted. No bony abnormality is noted. IMPRESSION: Changes consistent with CHF similar to that seen on the prior plain film. Electronically Signed   By: Inez Catalina M.D.   On: 02/18/2019 13:30        Scheduled Meds:  amLODipine  5 mg Oral QHS   aspirin EC  81 mg Oral Daily   Chlorhexidine Gluconate Cloth  6 each Topical Q0600   Chlorhexidine Gluconate Cloth  6 each Topical Q0600   doxercalciferol  4 mcg Intravenous Q T,Th,Sa-HD   gabapentin  100 mg Oral TID   heparin  5,000 Units Subcutaneous Q8H    HYDROmorphone (DILAUDID) injection  0.5 mg Intravenous Once   insulin aspart  0-9 Units Subcutaneous TID WC   insulin glargine  5 Units Subcutaneous QHS   mouth rinse  15 mL Mouth Rinse BID   multivitamin  1 tablet Oral QHS   pantoprazole  40 mg Oral Daily   sodium chloride flush  3 mL Intravenous Q12H   Continuous Infusions:  ferric gluconate (FERRLECIT/NULECIT) IV 125 mg (02/19/19 1535)     LOS: 1 day     Cordelia Poche, MD [facility] 02/20/2019, 12:24 PM  If 7PM-7AM, please contact night-coverage [email or web address]

## 2019-02-20 NOTE — Consult Note (Signed)
Chief Complaint: Patient was seen in consultation today for renal failure  Referring Physician(s): Dr. Jonnie Finner  Supervising Physician: Daryll Brod  Patient Status: [facility] - In-pt  History of Present Illness: Rhonda Lynch is a 63 y.o. female with past medical history of DM, ESRD on HD via right EJ tunneled dialysis catheter.  Patient did undergo a right forearm loop AVGG placement 01/19/19 with Dr. Donnetta Hutching. Her first dialysis session with new graft was attempted yesterday.  She was able to complete session, however noted to be clotted today.  IR consulted for declotting procedure at the request of Dr. Melvia Heaps.    She did eat breakfast this AM at 730.    Past Medical History:  Diagnosis Date   Diabetes mellitus without complication ([place])    ESRD (end stage renal disease) on dialysis ([place])    "TTS; [place]; they're moving me to another one" (09/22/2018)   Hepatitis C    diagnosed 09/2018   Hypertension    Oxygen deficiency    2 L at night   Stroke [facility])    Substance abuse ([place])    has been clean for 4 months     Past Surgical History:  Procedure Laterality Date   AV FISTULA PLACEMENT Left 03/05/2018   Procedure: ARTERIOVENOUS (AV) FISTULA CREATION  LEFT UPPER EXTREMITY;  Surgeon: [name], MD;  Location: [place];  Service: Vascular;  Laterality: Left;   AV FISTULA PLACEMENT Left 07/21/2018   Procedure: ARTERIOVENOUS (AV) FISTULA CREATION;  Surgeon: Marty Heck, MD;  Location: [place];  Service: Vascular;  Laterality: Left;   AV FISTULA PLACEMENT Left 10/27/2018   Procedure: INSERTION OF ARTERIOVENOUS (AV) GORE-TEX GRAFT UPPER ARM;  Surgeon: Marty Heck, MD;  Location: [place];  Service: Vascular;  Laterality: Left;   AV FISTULA PLACEMENT Right 01/19/2019   Procedure: INSERTION OF ARTERIOVENOUS GRAFT RIGHT ARM;  Surgeon: [name], MD;  Location: [place];  Service: Vascular;  Laterality: Right;   [place] Left 09/29/2018   Procedure: BASILIC VEIN TRANSPOSITION SECOND STAGE LEFT UPPER ARM;  Surgeon: Marty Heck, MD;  Location: [place];  Service: Vascular;  Laterality: Left;   HEMATOMA EVACUATION Left 03/06/2018   Procedure: EVACUATION HEMATOMA LEFT ARM;  Surgeon: Angelia Mould, MD;  Location: [place];  Service: Vascular;  Laterality: Left;   I&D EXTREMITY Left 10/06/2018   Procedure: IRRIGATION AND DEBRIDEMENT ARM;  Surgeon: Marty Heck, MD;  Location: [place];  Service: Vascular;  Laterality: Left;   INSERTION OF DIALYSIS CATHETER Left 04/29/2018   Procedure: INSERTION OF DIALYSIS CATHETER LEFT INTERNAL JUGULAR;  Surgeon: Angelia Mould, MD;  Location: [place];  Service: Vascular;  Laterality: Left;   INSERTION OF DIALYSIS CATHETER Right 01/19/2019   Procedure: INSERTION OF HEMO DIALYSIS  PALINDROME TUNNELED CATHETER;  Surgeon: [name], MD;  Location: [place];  Service: Vascular;  Laterality: Right;   IR FLUORO GUIDE CV LINE RIGHT  02/18/2018   IR FLUORO GUIDE CV LINE RIGHT  02/26/2018   IR FLUORO GUIDE CV LINE RIGHT  01/14/2019   IR REMOVAL TUN CV CATH W/O FL  04/27/2018   IR US GUIDE VASC ACCESS RIGHT  02/18/2018   IR US GUIDE VASC ACCESS RIGHT  01/14/2019   TEE WITHOUT CARDIOVERSION N/A 04/29/2018   Procedure: TRANSESOPHAGEAL ECHOCARDIOGRAM (TEE);  Surgeon: Sueanne Margarita, MD;  Location: [place];  Service: Cardiovascular;  Laterality: N/A;   THROMBECTOMY AND REVISION OF ARTERIOVENTOUS (AV) GORETEX  GRAFT Left 12/11/2018   Procedure: THROMBECTOMY AND  REVISION  ARTERIOVENTOUS GORETEX  GRAFT LEFT ARM;  Surgeon: [name], MD;  Location: [facility];  Service: Vascular;  Laterality: Left;   VENOGRAM Left 10/27/2018   Procedure: LEFT CENTRAL VENOGRAM;  Surgeon: Marty Heck, MD;  Location: [place];  Service: Vascular;  Laterality: Left;    Allergies: Acetaminophen and Hydroxyzine  Medications: Prior to Admission medications   Medication Sig Start Date End Date Taking?  Authorizing Provider  amLODipine (NORVASC) 5 MG tablet Take 1 tablet (5 mg total) by mouth at bedtime. 02/07/19  Yes Domenic Polite, MD  aspirin 81 MG EC tablet Take 1 tablet (81 mg total) by mouth daily. 02/07/19  Yes Domenic Polite, MD  B Complex-C-Zn-Folic Acid (DIALYVITE 161-WRUE 15) 0.8 MG TABS Take 1 tablet by mouth daily. 11/04/18  Yes [provider]  calcitRIOL (ROCALTROL) 0.25 MCG capsule Take 1 capsule (0.25 mcg total) by mouth every Monday, Wednesday, and Friday with hemodialysis. Patient taking differently: Take 0.25 mcg by mouth every Tuesday, Thursday, and Saturday at 6 PM.  02/09/19  Yes Domenic Polite, MD  fluticasone [facility]) 50 MCG/ACT nasal spray Place 2 sprays into both nostrils as needed for allergies.    Yes [provider]  gabapentin (NEURONTIN) 100 MG capsule Take 1 capsule (100 mg total) by mouth 2 (two) times daily for 30 days. Patient taking differently: Take 100 mg by mouth 3 (three) times daily.  02/07/19 03/09/19 Yes Domenic Polite, MD  hydrocortisone cream 1 % Apply 1 application topically 2 (two) times daily. Patient taking differently: Apply 1 application topically 2 (two) times daily as needed for itching.  01/20/19  Yes Pokhrel, [place], MD  insulin aspart (NOVOLOG) 100 UNIT/ML injection Inject 5 Units into the skin 3 (three) times daily with meals. Patient taking differently: Inject 3 Units into the skin 3 (three) times daily with meals.  10/13/18  Yes Alphonzo Grieve, MD  insulin glargine (LANTUS) 100 UNIT/ML injection Inject 0.05 mLs (5 Units total) into the skin at bedtime. 02/07/19  Yes Domenic Polite, MD  oxyCODONE (OXY IR/ROXICODONE) 5 MG immediate release tablet Take 1 tablet (5 mg total) by mouth every 6 (six) hours as needed for moderate pain or severe pain. 02/07/19  Yes Domenic Polite, MD  pantoprazole (PROTONIX) 40 MG tablet Take 1 tablet (40 mg total) by mouth daily. 02/07/19  Yes Domenic Polite, MD  polyethylene glycol (MIRALAX /  GLYCOLAX) 17 g packet Take 17 g by mouth daily. Patient taking differently: Take 17 g by mouth daily as needed for mild constipation or moderate constipation.  02/07/19  Yes Domenic Polite, MD  sevelamer carbonate (RENVELA) 800 MG tablet Take 2 tablets (1,600 mg total) by mouth 3 (three) times daily with meals. 02/07/19  Yes Domenic Polite, MD  Blood Glucose Monitoring Suppl (ACCU-CHEK AVIVA) device Use as instructed 10/29/18 10/29/19  Isabelle Course, MD  glucose blood (ACCU-CHEK AVIVA) test strip Use twice per day. 10/29/18   Isabelle Course, MD  Insulin Syringe-Needle U-100 (INSULIN SYRINGE .3CC/31GX5/16") 31G X 5/16" 0.3 ML MISC Inject 1 each into the skin 4 (four) times daily. 06/27/18   Clent Demark, PA-C  Lancets (ACCU-CHEK SOFT [facility]) lancets Use twice per day. 10/29/18   Isabelle Course, MD     Family History  Problem Relation Age of Onset   Autoimmune disease Neg Hx     Social History   Socioeconomic History   Marital status: Single  Spouse name: Not on file   Number of children: Not on file   Years of education: Not on file   Highest education level: Not on file  Occupational History   Not on file  Social Needs   Financial resource strain: Not on file   Food insecurity    Worry: Not on file    Inability: Not on file   Transportation needs    Medical: Not on file    Non-medical: Not on file  Tobacco Use   Smoking status: Current Every Day Smoker    Packs/day: 0.50    Years: 48.00    Pack years: 24.00    Types: Cigarettes   Smokeless tobacco: Never Used   Tobacco comment: trying to quit ; smoking 1 1/2 cigarettes/day  (09/22/2018)  Substance and Sexual Activity   Alcohol use: Not Currently    Comment: 09/22/2018 "stopped about 6 months ago"   Drug use: Yes    Types: Cocaine, Heroin    Comment: See notes   Sexual activity: Not Currently    Birth control/protection: None  Lifestyle   Physical activity    Days per week: Not on file    Minutes  per session: Not on file   Stress: Not on file  Relationships   Social connections    Talks on phone: Not on file    Gets together: Not on file    Attends religious service: Not on file    Active member of club or organization: Not on file    Attends meetings of clubs or organizations: Not on file    Relationship status: Not on file  Other Topics Concern   Not on file  Social History Narrative   Per notes, Pt lives with her son.     Review of Systems: A 12 point ROS discussed and pertinent positives are indicated in the HPI above.  All other systems are negative.  Review of Systems  Constitutional: Negative for fatigue and fever.  Respiratory: Negative for cough and shortness of breath.   Cardiovascular: Negative for chest pain.  Gastrointestinal: Negative for abdominal pain, nausea and vomiting.  Musculoskeletal: Negative for back pain.  Psychiatric/Behavioral: Negative for behavioral problems and confusion.    Vital Signs: BP 129/66 (BP Location: Left Arm)    Pulse 90    Temp 98.6 F (37 C) (Oral)    Resp 18    Ht 5\' 1"  (1.549 m)    Wt 122 lb 4.8 oz (55.5 kg)    SpO2 96%    BMI 23.11 kg/m   Physical Exam Vitals signs and nursing note reviewed.  HENT:     Mouth/Throat:     Mouth: Mucous membranes are moist.     Pharynx: Oropharynx is clear.  Cardiovascular:     Rate and Rhythm: Normal rate and regular rhythm.  Pulmonary:     Effort: Pulmonary effort is normal. No respiratory distress.     Breath sounds: Normal breath sounds.  Musculoskeletal:     Comments: R forearm AVGG palpable, but no bruit or thrill.   Skin:    General: Skin is warm and dry.  Neurological:     General: No focal deficit present.     Mental Status: She is alert and oriented to person, place, and time. Mental status is at baseline.  Psychiatric:        Mood and Affect: Mood normal.        Behavior: Behavior normal.  Thought Content: Thought content normal.        Judgment: Judgment  normal.      MD Evaluation Airway: WNL Heart: WNL Abdomen: WNL Chest/ Lungs: WNL ASA  Classification: 3 Mallampati/Airway Score: One   Imaging: Dg Chest 2 View  Result Date: 02/05/2019 CLINICAL DATA:  Hypoxia.  Renal failure EXAM: CHEST - 2 VIEW COMPARISON:  Chest radiograph and chest CT Feb 03, 2019 FINDINGS: Central catheter tip is at the cavoatrial junction. No pneumothorax. Widespread alveolar opacity throughout the lungs persists with airspace consolidation in the right base. Appearance is similar to recent studies. There is cardiomegaly with pulmonary venous hypertension. There is aortic atherosclerosis. No adenopathy. No bone lesions. IMPRESSION: Appearance of the lungs is essentially stable compared to 2 days prior. There is stable cardiomegaly with pulmonary venous hypertension. Suspect congestive heart failure with extensive alveolar edema. There may also be a degree of superimposed pneumonia and/or ARDS. More than one of these entities may be present concurrently. Aortic Atherosclerosis (ICD10-I70.0). Electronically Signed   By: Lowella Grip III M.D.   On: 02/05/2019 08:08   Ct Angio Chest Pe W Or Wo Contrast  Result Date: 02/03/2019 CLINICAL DATA:  Dyspnea.  Fever.  Cough.  Abdominal pain. EXAM: CT ANGIOGRAPHY CHEST WITH CONTRAST TECHNIQUE: Multidetector CT imaging of the chest was performed using the standard protocol during bolus administration of intravenous contrast. Multiplanar CT image reconstructions and MIPs were obtained to evaluate the vascular anatomy. CONTRAST:  181mL OMNIPAQUE IOHEXOL 350 MG/ML SOLN COMPARISON:  01/12/2019 chest CT angiogram. Chest radiograph from earlier today. FINDINGS: Cardiovascular: The study is moderate quality for the evaluation of pulmonary embolism, some motion degradation. There are no convincing filling defects in the central, lobar, segmental or subsegmental pulmonary artery branches to suggest acute pulmonary embolism. Atherosclerotic  nonaneurysmal thoracic aorta. Top-normal caliber main pulmonary artery (3.2 cm diameter), stable. Stable moderate cardiomegaly. No significant pericardial fluid/thickening. Right internal jugular central venous catheter terminates in the right atrium. Mediastinum/Nodes: No discrete thyroid nodules. Unremarkable esophagus. No pathologically enlarged axillary, mediastinal or hilar lymph nodes. Lungs/Pleura: No pneumothorax. Trace dependent left pleural effusion. No right pleural effusion. Extensive patchy peribronchovascular consolidation and ground-glass opacity throughout both lungs, most prominent in the upper lobes, significantly worsened from prior chest CT. Relative sparing of peripheral and basilar lungs. No discrete lung masses. Upper abdomen: Contrast reflux into the IVC and hepatic veins. Cholecystectomy. Musculoskeletal: No aggressive appearing focal osseous lesions. Mild thoracic spondylosis. Review of the MIP images confirms the above findings. IMPRESSION: 1. No evidence of pulmonary embolism. 2. Moderate cardiomegaly. Extensive patchy central consolidation and ground-glass opacity symmetrically involving both lungs, significantly worsened from 01/12/2019 chest CT. Trace dependent left pleural effusion. Contrast reflux into the IVC and hepatic veins. Findings favor congestive heart failure with severe acute cardiogenic pulmonary edema. Multilobar pneumonia or ARDS cannot be excluded. Aortic Atherosclerosis (ICD10-I70.0). Electronically Signed   By: Ilona Sorrel M.D.   On: 02/03/2019 16:45   Ct Abdomen Pelvis W Contrast  Result Date: 02/05/2019 CLINICAL DATA:  Left lower quadrant abdominal pain and fever beginning 2 days ago. Hemodialysis patient. EXAM: CT ABDOMEN AND PELVIS WITH CONTRAST TECHNIQUE: Multidetector CT imaging of the abdomen and pelvis was performed using the standard protocol following bolus administration of intravenous contrast. CONTRAST:  52mL OMNIPAQUE IOHEXOL 300 MG/ML  SOLN  COMPARISON:  12/03/2018 FINDINGS: Lower chest: Patchy interstitial pulmonary densities in both lower lungs are improved since the study of 2 months ago. This could relate to improving pneumonia or could  indicate an element of interstitial edema. No effusion. Cardiomegaly. Hepatobiliary: Liver parenchyma appears normal. There has been previous cholecystectomy. Mild biliary ductal prominence consistent with previous cholecystectomy, similar to the previous study. Pancreas: Normal Spleen: Chronic benign appearing 1.7 cm low-density, not clinically relevant. Adrenals/Urinary Tract: The adrenal glands are normal. Renal size is normal. The kidneys are poorly functioning without visible excretion. No focal renal lesion. No hydroureteronephrosis. Bladder appears normal. Stomach/Bowel: No bowel pathology is seen. No evidence of ileus or obstruction. No evidence of diverticulosis or diverticulitis. Vascular/Lymphatic: Aortic atherosclerosis. No aneurysm. Atherosclerosis of the branch vessels. IVC is normal. No adenopathy. Small retroperitoneal phleboliths. Reproductive: Previous hysterectomy. No pelvic mass. 1.5 cm fatty area associated with the left ovary, possibly a small benign dermoid, unchanged. Other: No free fluid or air. Musculoskeletal: Lower lumbar degenerative changes most severe at L4-5. IMPRESSION: No acute abdominal or pelvic finding. No specific cause of left lower quadrant pain. No sign of diverticulosis or diverticulitis or other acute bowel pathology. Interstitial pulmonary density, improved since the previous study. This could represent resolving pneumonia or edema. No effusions. Previous cholecystectomy and hysterectomy. Poorly functioning/nonfunctioning kidneys. Electronically Signed   By: Nelson Chimes M.D.   On: 02/05/2019 13:48   Dg Chest Port 1 View  Result Date: 02/18/2019 CLINICAL DATA:  Chest pain and shortness of breath EXAM: PORTABLE CHEST 1 VIEW COMPARISON:  02/05/2019 FINDINGS: Cardiac  shadow remains enlarged. Aortic calcifications are again seen. Dialysis catheter is again noted on the right. Diffuse alveolar edema is noted similar to that seen on the prior exam. Some more focal scarring in right base is noted. No bony abnormality is noted. IMPRESSION: Changes consistent with CHF similar to that seen on the prior plain film. Electronically Signed   By: Inez Catalina M.D.   On: 02/18/2019 13:30   Dg Chest Portable 1 View  Result Date: 02/03/2019 CLINICAL DATA:  Shortness of breath. EXAM: PORTABLE CHEST 1 VIEW COMPARISON:  Radiograph of Jan 19, 2019. FINDINGS: Stable cardiomegaly. Right internal jugular dialysis catheter is unchanged in position. No pneumothorax or pleural effusion is noted. Stable bilateral reticular densities are noted concerning for edema. Minimal pleural effusions may be present. Bony thorax is unremarkable. IMPRESSION: Stable bilateral reticular densities are noted concerning for pulmonary edema. Minimal pleural effusions. Electronically Signed   By: Marijo Conception M.D.   On: 02/03/2019 13:52   Vas [place] Upper Extremity Venous Duplex  Result Date: 02/05/2019 UPPER VENOUS STUDY  Indications: Pain Performing Technologist: June Leap RDMS, RVT  Examination Guidelines: A complete evaluation includes B-mode imaging, spectral Doppler, color Doppler, and power Doppler as needed of all accessible portions of each vessel. Bilateral testing is considered an integral part of a complete examination. Limited examinations for reoccurring indications may be performed as noted.  Right Findings: +----------+------------+---------+-----------+----------+--------------+  RIGHT      Compressible Phasicity Spontaneous Properties    Summary      +----------+------------+---------+-----------+----------+--------------+  IJV                                                      Not visualized  +----------+------------+---------+-----------+----------+--------------+  Subclavian                  Yes        Yes                                +----------+------------+---------+-----------+----------+--------------+  Axillary       Full        Yes        Yes                                +----------+------------+---------+-----------+----------+--------------+  Brachial       Full        Yes        Yes                                +----------+------------+---------+-----------+----------+--------------+  Radial         Full        Yes        Yes                                +----------+------------+---------+-----------+----------+--------------+  Ulnar          Full                                                      +----------+------------+---------+-----------+----------+--------------+  Cephalic       Full                                                      +----------+------------+---------+-----------+----------+--------------+  Basilic        Full                                                      +----------+------------+---------+-----------+----------+--------------+  Left Findings: +----------+------------+---------+-----------+----------+--------------+  LEFT       Compressible Phasicity Spontaneous Properties    Summary      +----------+------------+---------+-----------+----------+--------------+  Subclavian                                               Not visualized  +----------+------------+---------+-----------+----------+--------------+  Summary:  Right: No evidence of deep vein thrombosis in the upper extremity. No evidence of superficial vein thrombosis in the upper extremity.  *See table(s) above for measurements and observations.  Diagnosing physician: Servando Snare MD Electronically signed by Servando Snare MD on 02/05/2019 at 12:42:34 PM.    Final     Labs:  CBC: Recent Labs    02/07/19 0325 02/18/19 1244 02/19/19 0639 02/20/19 0524  WBC 9.9 13.6* 16.0* 10.6*  HGB 8.0* 8.2* 7.6* 7.4*  HCT 24.5* 27.0* 24.6* 24.3*  PLT 320 287 258 215    COAGS: Recent Labs     10/27/18 0311 01/11/19 2043 01/14/19 1017 01/19/19 0257  INR 0.94 1.2 1.0 1.1    BMP: Recent Labs    02/07/19 0325 02/18/19 1724 02/19/19 0639 02/20/19 0524  NA 129* 134* 138 136  K 5.0 6.1* 5.7* 4.1  CL 93* 99 99 100  CO2 24 25 26 26   GLUCOSE 87 99 101* 109*  BUN 35* 24* 31* 24*  CALCIUM 8.4* 8.8* 8.9 8.7*  CREATININE 7.36* 7.26* 8.34* 6.38*  GFRNONAA 5* 5* 5* 6*  GFRAA 6* 6* 5* 7*    LIVER FUNCTION TESTS: Recent Labs    12/18/18 1838 12/28/18 1023  01/20/19 0739 02/03/19 1400 02/18/19 1724 02/19/19 0639  BILITOT 0.6 0.6  --   --  0.6 1.5*  --   AST 26 36  --   --  31 75*  --   ALT 15 24  --   --  14 20  --   ALKPHOS 87 112  --   --  119 102  --   PROT 7.5 6.8  --   --  7.4 7.0  --   ALBUMIN 2.8* 2.5*   < > 2.1* 2.3* 2.1* 2.0*   < > = values in this interval not displayed.    TUMOR MARKERS: No results for input(s): AFPTM, CEA, CA199, CHROMGRNA in the last 8760 hours.  Assessment and Plan: Renal failure, s/p R forearm [place] place 01/19/19 Patient s/p HD yesterday via R arm graft, however now clotted.  No palpable bruit or thrill on assessment today.  She does have a right EJ tunneled catheter which remains in place.  Plan for possible declot this afternoon as schedule allows.   Patient to be NPO.  INR ordered.   Risks and benefits discussed with the patient including, but not limited to bleeding, infection, vascular injury, pulmonary embolism, need for tunneled HD catheter placement or even death.  All of the patient's questions were answered, patient is agreeable to proceed. Consent signed and in chart.  Thank you for this interesting consult.  I greatly enjoyed meeting Rhonda Lynch and look forward to participating in their care.  A copy of this report was sent to the requesting provider on this date.  Electronically Signed: Docia Barrier, PA 02/20/2019, 12:08 PM   I spent a total of 40 Minutes    in face to face in clinical  consultation, greater than 50% of which was counseling/coordinating care for renal failure, clotted access.

## 2019-02-21 LAB — HEPATITIS B SURFACE ANTIGEN: Hepatitis B Surface Ag: NEGATIVE

## 2019-02-21 LAB — GLUCOSE, CAPILLARY: Glucose-Capillary: 73 mg/dL (ref 70–99)

## 2019-02-21 LAB — TROPONIN I: Troponin I: <0.03 ng/mL (ref <0.03)

## 2019-02-21 MED ORDER — HEPARIN SODIUM (PORCINE) 1000 UNIT/ML IJ SOLN
INTRAMUSCULAR | Status: AC
  Filled 2019-02-21: qty 4

## 2019-02-21 MED ORDER — DICLOFENAC SODIUM 1 % TD GEL
2.0000 g | Freq: Four times a day (QID) | TRANSDERMAL | Status: DC
  Administered 2019-02-21: 2 g via TOPICAL
  Filled 2019-02-21: qty 100

## 2019-02-21 MED ORDER — DICLOFENAC SODIUM 1 % TD GEL: 2.0000 g | Freq: Four times a day (QID) | TRANSDERMAL | 0 refills | Status: AC

## 2019-02-21 MED ORDER — DOXERCALCIFEROL 4 MCG/2ML IV SOLN
INTRAVENOUS | Status: AC
  Administered 2019-02-21: 4 ug via INTRAVENOUS
  Filled 2019-02-21: qty 2

## 2019-02-21 MED ORDER — OXYCODONE HCL 5 MG PO TABS
ORAL_TABLET | ORAL | Status: AC
  Filled 2019-02-21: qty 1

## 2019-02-21 NOTE — Progress Notes (Signed)
NP Owens Shark was in the room when pt. complained of chest pain 8/10 ratio.  ekg was ordered and performed. Labs to be drawn. Will continue to monitor pt.

## 2019-02-21 NOTE — Progress Notes (Addendum)
[facility] Progress Note   Subjective: Sitting up at bedside, says she is having 8/10 chest pain, localizing to midsternum. Says pain is worse when she bends over or takes deep breath. Has not report pain to nurse.   Objective Vitals:   02/20/19 1655 02/20/19 2137 02/21/19 0603 02/21/19 0916  BP: 139/75 125/70 126/82 117/67  Pulse: 90 88 75 (!) 106  Resp: 20 19 19 18   Temp: 98.5 F (36.9 C) 97.8 F (36.6 C) 97.9 F (36.6 C) 98.5 F (36.9 C)  TempSrc: Oral Oral Oral Oral  SpO2: 96% 98% 100% (!) 68%  Weight:  56 kg    Height:       Physical Exam General: Pleasant, NAD Heart: S1,S2 RRR ST HR 100s on monitor Lungs: CTAB A/P Abdomen: Active BS Extremities: No LE edema Dialysis Access: R AVG No B/T RIJ TDC drsg intact   Additional Objective Labs: Basic Metabolic Panel: Recent Labs  Lab 02/18/19 1724 02/19/19 0639 02/20/19 0524  NA 134* 138 136  K 6.1* 5.7* 4.1  CL 99 99 100  CO2 25 26 26   GLUCOSE 99 101* 109*  BUN 24* 31* 24*  CREATININE 7.26* 8.34* 6.38*  CALCIUM 8.8* 8.9 8.7*  PHOS  --  5.0*  --    Liver Function Tests: Recent Labs  Lab 02/18/19 1724 02/19/19 0639  AST 75*  --   ALT 20  --   ALKPHOS 102  --   BILITOT 1.5*  --   PROT 7.0  --   ALBUMIN 2.1* 2.0*   No results for input(s): LIPASE, AMYLASE in the last 168 hours. CBC: Recent Labs  Lab 02/18/19 1244 02/19/19 0639 02/20/19 0524  WBC 13.6* 16.0* 10.6*  NEUTROABS 9.7*  --   --   HGB 8.2* 7.6* 7.4*  HCT 27.0* 24.6* 24.3*  MCV 75.0* 74.3* 75.0*  PLT 287 258 215   Blood Culture    Component Value Date/Time   SDES BLOOD SITE NOT SPECIFIED 02/18/2019 1218   SPECREQUEST  02/18/2019 1218    BOTTLES DRAWN AEROBIC ONLY Blood Culture results may not be optimal due to an inadequate volume of blood received in culture bottles   CULT  02/18/2019 1218    NO GROWTH 3 DAYS Performed at [facility] [address], [place], [place] [address]    REPTSTATUS PENDING  02/18/2019 1218    Cardiac Enzymes: Recent Labs  Lab 02/18/19 1230  TROPONINI 0.04*   CBG: Recent Labs  Lab 02/20/19 0655 02/20/19 1105 02/20/19 1623 02/20/19 2139 02/21/19 0621  GLUCAP 87 125* 82 156* 73   Iron Studies: No results for input(s): IRON, TIBC, TRANSFERRIN, FERRITIN in the last 72 hours. @lablastinr3 @ Studies/Results: Ir Thrombectomy Av Fistula W/thrombolysis Inc/shunt/img Right  Result Date: 02/20/2019 INDICATION: Occluded right forearm AV loop graft EXAM: Right forearm AV declot procedure thrombectomy thrombo lysis and 6 mm venous angioplasty MEDICATIONS: 3000 units heparin 2 tPA ANESTHESIA/SEDATION: Moderate Sedation Time:  35 minutes 2 mg Versed, 50 mcg fentanyl The patient was continuously monitored during the procedure by the interventional radiology nurse under my direct supervision. FLUOROSCOPY TIME:  Fluoroscopy Time: 4 minutes 20 seconds (9.0 mGy). COMPLICATIONS: None immediate. PROCEDURE: Informed written consent was obtained from the patient after a thorough discussion of the procedural risks, benefits and alternatives. All questions were addressed. Maximal Sterile Barrier Technique was utilized including caps, mask, sterile gowns, sterile gloves, sterile drape, hand hygiene and skin antiseptic. A timeout was performed prior to the initiation of  the procedure. Under sterile conditions and local anesthesia, ultrasound micropuncture access performed of the arterial side of the graft to sites. Images obtained for documentation of the venous access. Six French sheath inserted over Bentson guidewires. Catheter guidewire advanced across the venous outflow. Outflow venogram performed brachial vein the upper arm is patent. Pull-back venogram confirms thrombotic occlusion of outflow vein anastomosis at the elbow. 2 mg tPA instilled for thrombolysis. Mechanical thrombectomy performed with the Angiojet device throughout the entire loop graft utilizing both sheaths. Overlapping 6  mm angioplasty performed throughout the graft and at the venous anastomosis. Fogarty embolectomy performed of the arterial plug to re-established inflow. Both sheaths were back bled and syringe aspirated. Initial Shuntogram demonstrates restored flow and patency with low resistance flow through the graft. Residual intragraft nonocclusive thrombus noted and mild narrowing of the venous anastomosis. This responded to additional overlapping venous angioplasty. Reflux across the arterial anastomosis demonstrates patency of the arterial limb of the graft. Complete shuntogram demonstrates patency of the outflow upper arm veins. Centrally the axillary, subclavian and innominate veins are patent. SVC patent. No central stenosis or occlusion. Access removed. Hemostasis obtained with pursestring sutures at both sites. Patient tolerated the procedure well. No immediate complication IMPRESSION: Successful right forearm AV graft declot procedure requiring thrombo lysis, thrombectomy and venous angioplasty to restore flow. ACCESS: This access remains amenable to future percutaneous interventions as clinically indicated. Electronically Signed   By: Jerilynn Mages.  Shick M.D.   On: 02/20/2019 16:06   Medications: . ferric gluconate (FERRLECIT/NULECIT) IV 125 mg (02/19/19 1535)   . amLODipine  5 mg Oral QHS  . aspirin EC  81 mg Oral Daily  . Chlorhexidine Gluconate Cloth  6 each Topical Q0600  . Chlorhexidine Gluconate Cloth  6 each Topical Q0600  . doxercalciferol  4 mcg Intravenous Q T,Th,Sa-HD  . gabapentin  100 mg Oral TID  . heparin  5,000 Units Subcutaneous Q8H  .  HYDROmorphone (DILAUDID) injection  0.5 mg Intravenous Once  . insulin aspart  0-9 Units Subcutaneous TID WC  . insulin glargine  5 Units Subcutaneous QHS  . mouth rinse  15 mL Mouth Rinse BID  . multivitamin  1 tablet Oral QHS  . pantoprazole  40 mg Oral Daily  . sodium chloride flush  3 mL Intravenous Q12H     Dialysis Orders: TTS [facility] 4 hr 400/800 EDW  54 2 K 2.5 Ca right forearm AVGG TDC  heparin 3000 units IV TIW Hectorol 4 mcg IV TIW Mircera 225 mcg IV q 2 wk - last 6/4 venofer 100 through 6/16  Recent labs tsat 6% hgb 8.7 6/4   Assessment/Plan: 1. Chest pain: New problem. Localizing to mid sternum. Will order stat EKG/Troponin. Will notify primary  2. Hyperkalemia- due to under dialysis from chronic shortened dialysis- Resolved with HD 3. Pulmonary edema - excess volume due to shortened HD treatments and high IDWG, titrate down 4. 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 Vaughan Browner per last d/cCOVID Neg  5. ESRD- TTS. HD today.  Dendron (placed 5/11) had first use here on 6/11. On 6/12 AVG was clotted and IR did declot, today AVG is open and we still also have TDC in place.    6.  Hypertension/volumeBP much improved; titrate down with HD - encourage to stay on treatment - net UF 2.5 L Thursday; still above edw push to goal today.  7. Fe deficiency anemia- hgb down to  7.6 > 7.4 From 8.7 6/4 has VERY low Fe stores (tsat 6% 6/2 down from 15 % mid May even in the setting of Fe repletion - positive stools in March - will repeat FOBT - extend course of IV Fe at discharge - has been on Max ESA -last dose 6/4; if stool + given epigastric pain - may need EGD on PPI hold heparin for now pending FOBT - 8. Metabolic bone disease- Ca 8.7 C Ca 10.3 Change to 2.0 Ca bath. No binders on OP med list.  9. Nutrition- renal carb mod/vit 10. DM 11. Hx CVA 12. Hep C + 13. Fever - BC pending - Abtx per primary, COVID negative 14. Hx of dialysis and medical noncompliance though seems to be making some improvement in this regard.  Currently lives alone in a motel.   Rita H. Brown NP-C 02/21/2019, 9:39 AM  [facility] [phone]  Pt seen, examined and agree w assess/plan as above with additions as indicated.  [facility] 02/21/2019, 12:46 PM

## 2019-02-21 NOTE — Discharge Summary (Signed)
Physician Discharge Summary  Rhonda Lynch ZRA:[id number] DOB: 02-04-1956 DOA: 02/18/2019  PCP: Clent Demark, PA-C  Admit date: 02/18/2019 Discharge date: 02/21/2019  Admitted From: Home Disposition: Home  Recommendations for Outpatient Follow-up:  1. Follow up with PCP in 1 week 2. Please obtain BMP/CBC in one week 3. Please follow up on the following pending results: Blood cultures  Home Health: None Equipment/Devices: None  Discharge Condition: Stable CODE STATUS: Full code Diet recommendation: Renal/carb modified   Brief/Interim Summary:  Admission HPI written by Lenore Cordia, MD   Chief Complaint: Shortness of breath  HPI: Rhonda Lynch is a 63 y.o. 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  She did go to her usual dialysis session yesterday but reportedly stopped 45 minutes early due to feeling generally unwell with chest and abdominal discomfort.  She developed shortness of breath overnight.  She reportedly had an episode of emesis 2 days prior which has not been persistent.  She has chronic unchanged diarrhea.  Patient was recently admitted from 02/03/2019-02/07/2019 for acute hypoxic respiratory failure.  She was treated with broad-spectrum antibiotics for presumed pneumonia and dialysis with extra volume removal.  CTA chest showed patchy consolidation and diffuse groundglass changes which appeared chronic from prior study in 2019.  Case was discussed with pulmonology, Dr. Vaughan Browner, who recommended outpatient follow-up with plan for HRCT improved, however does not appear patient was able to follow-up yet.   Hospital course:  Hyperkalemia Acute pulmonary edema Secondary to non-adherence with outpatient  HD. Symptoms have improved from yesterday. Potassium improved from yesterday with Lokelma. Plan for HD per nephrology. AVG was not functioning for which IR declotted. Patient received HD prior to discharge.  Fever SIRS Present on admission. No source. Upon ROS, patient denies any symptoms other than shortness of breath which is likely secondary to pulmonary edema. Elevated WBC. Blood cultures obtained. Received one dose of Vancomycin/cefepime/metronidazole in the ED. MRSA pcr is negative. Fever curve has remained good without antibiotics. WBC stable.  Diabetes mellitus Insulin dependent. Continue Lantus and SSI  Anemia of chronic disease Likely secondary to kidney disease. Baseline appears to be between 7.5-8. Currently baseline.  ESRD TTS scheduled. Management above  History of CVA Continue aspirin  History of BOOP Outpatient follow-up  GERD Continue Protonix  Chest pain Chronic. Reproducible. EKG unremarkable. Voltaren gel prescribed. Patient inquired about narcotics. Recommend follow-up with PCP for consideration as this is chronic pain.  Discharge Diagnoses:  Principal Problem:   Acute pulmonary edema (HCC) Active Problems:   ESRD on dialysis ([place])   Insulin dependent diabetes mellitus ([place])   Hyperkalemia   History of CVA (cerebrovascular accident)    Discharge Instructions   Allergies as of 02/21/2019      Reactions   Acetaminophen Nausea And Vomiting   Patient states Acetaminophen and Acetaminophen containing products make her nauseated. She demonstrated this 06/21/18 with nausea followed by emesis.   Hydroxyzine Itching      Medication List    TAKE these medications   Accu-Chek Aviva device Use as instructed   accu-chek soft touch lancets Use twice per day.   amLODipine 5 MG tablet Commonly known as: NORVASC Take 1 tablet (5 mg total) by mouth at bedtime.   aspirin 81 MG EC tablet Take  1 tablet (81 mg total) by mouth daily.   calcitRIOL  0.25 MCG capsule Commonly known as: ROCALTROL Take 1 capsule (0.25 mcg total) by mouth every Monday, Wednesday, and Friday with hemodialysis. What changed: when to take this   Dialyvite 800-Zinc 15 0.8 MG Tabs Take 1 tablet by mouth daily.   diclofenac sodium 1 % Gel Commonly known as: VOLTAREN Apply 2 g topically 4 (four) times daily.   fluticasone 50 MCG/ACT nasal spray Commonly known as: FLONASE Place 2 sprays into both nostrils as needed for allergies.   gabapentin 100 MG capsule Commonly known as: NEURONTIN Take 1 capsule (100 mg total) by mouth 2 (two) times daily for 30 days. What changed: when to take this   glucose blood test strip Commonly known as: Accu-Chek Aviva Use twice per day.   hydrocortisone cream 1 % Apply 1 application topically 2 (two) times daily. What changed:   when to take this  reasons to take this   insulin aspart 100 UNIT/ML injection Commonly known as: novoLOG Inject 5 Units into the skin 3 (three) times daily with meals. What changed: how much to take   insulin glargine 100 UNIT/ML injection Commonly known as: LANTUS Inject 0.05 mLs (5 Units total) into the skin at bedtime.   INSULIN SYRINGE .3CC/31GX5/16" 31G X 5/16" 0.3 ML Misc Inject 1 each into the skin 4 (four) times daily.   oxyCODONE 5 MG immediate release tablet Commonly known as: Oxy IR/ROXICODONE Take 1 tablet (5 mg total) by mouth every 6 (six) hours as needed for moderate pain or severe pain.   pantoprazole 40 MG tablet Commonly known as: Protonix Take 1 tablet (40 mg total) by mouth daily.   polyethylene glycol 17 g packet Commonly known as: MIRALAX / GLYCOLAX Take 17 g by mouth daily. What changed:   when to take this  reasons to take this   sevelamer carbonate 800 MG tablet Commonly known as: RENVELA Take 2 tablets (1,600 mg total) by mouth 3 (three) times daily with meals.       Allergies  Allergen Reactions   Acetaminophen Nausea And Vomiting     Patient states Acetaminophen and Acetaminophen containing products make her nauseated. She demonstrated this 06/21/18 with nausea followed by emesis.   Hydroxyzine Itching    Consultations:  Nephrology  IR   Procedures/Studies: Dg Chest 2 View  Result Date: 02/05/2019 CLINICAL DATA:  Hypoxia.  Renal failure EXAM: CHEST - 2 VIEW COMPARISON:  Chest radiograph and chest CT Feb 03, 2019 FINDINGS: Central catheter tip is at the cavoatrial junction. No pneumothorax. Widespread alveolar opacity throughout the lungs persists with airspace consolidation in the right base. Appearance is similar to recent studies. There is cardiomegaly with pulmonary venous hypertension. There is aortic atherosclerosis. No adenopathy. No bone lesions. IMPRESSION: Appearance of the lungs is essentially stable compared to 2 days prior. There is stable cardiomegaly with pulmonary venous hypertension. Suspect congestive heart failure with extensive alveolar edema. There may also be a degree of superimposed pneumonia and/or ARDS. More than one of these entities may be present concurrently. Aortic Atherosclerosis (ICD10-I70.0). Electronically Signed   By: Lowella Grip III M.D.   On: 02/05/2019 08:08   Ct Angio Chest Pe W Or Wo Contrast  Result Date: 02/03/2019 CLINICAL DATA:  Dyspnea.  Fever.  Cough.  Abdominal pain. EXAM: CT ANGIOGRAPHY CHEST WITH CONTRAST TECHNIQUE: Multidetector CT imaging of the chest was performed using the standard protocol during bolus administration of intravenous contrast. Multiplanar CT image  reconstructions and MIPs were obtained to evaluate the vascular anatomy. CONTRAST:  162mL OMNIPAQUE IOHEXOL 350 MG/ML SOLN COMPARISON:  01/12/2019 chest CT angiogram. Chest radiograph from earlier today. FINDINGS: Cardiovascular: The study is moderate quality for the evaluation of pulmonary embolism, some motion degradation. There are no convincing filling defects in the central, lobar, segmental or  subsegmental pulmonary artery branches to suggest acute pulmonary embolism. Atherosclerotic nonaneurysmal thoracic aorta. Top-normal caliber main pulmonary artery (3.2 cm diameter), stable. Stable moderate cardiomegaly. No significant pericardial fluid/thickening. Right internal jugular central venous catheter terminates in the right atrium. Mediastinum/Nodes: No discrete thyroid nodules. Unremarkable esophagus. No pathologically enlarged axillary, mediastinal or hilar lymph nodes. Lungs/Pleura: No pneumothorax. Trace dependent left pleural effusion. No right pleural effusion. Extensive patchy peribronchovascular consolidation and ground-glass opacity throughout both lungs, most prominent in the upper lobes, significantly worsened from prior chest CT. Relative sparing of peripheral and basilar lungs. No discrete lung masses. Upper abdomen: Contrast reflux into the IVC and hepatic veins. Cholecystectomy. Musculoskeletal: No aggressive appearing focal osseous lesions. Mild thoracic spondylosis. Review of the MIP images confirms the above findings. IMPRESSION: 1. No evidence of pulmonary embolism. 2. Moderate cardiomegaly. Extensive patchy central consolidation and ground-glass opacity symmetrically involving both lungs, significantly worsened from 01/12/2019 chest CT. Trace dependent left pleural effusion. Contrast reflux into the IVC and hepatic veins. Findings favor congestive heart failure with severe acute cardiogenic pulmonary edema. Multilobar pneumonia or ARDS cannot be excluded. Aortic Atherosclerosis (ICD10-I70.0). Electronically Signed   By: Ilona Sorrel M.D.   On: 02/03/2019 16:45   Ct Abdomen Pelvis W Contrast  Result Date: 02/05/2019 CLINICAL DATA:  Left lower quadrant abdominal pain and fever beginning 2 days ago. Hemodialysis patient. EXAM: CT ABDOMEN AND PELVIS WITH CONTRAST TECHNIQUE: Multidetector CT imaging of the abdomen and pelvis was performed using the standard protocol following bolus  administration of intravenous contrast. CONTRAST:  43mL OMNIPAQUE IOHEXOL 300 MG/ML  SOLN COMPARISON:  12/03/2018 FINDINGS: Lower chest: Patchy interstitial pulmonary densities in both lower lungs are improved since the study of 2 months ago. This could relate to improving pneumonia or could indicate an element of interstitial edema. No effusion. Cardiomegaly. Hepatobiliary: Liver parenchyma appears normal. There has been previous cholecystectomy. Mild biliary ductal prominence consistent with previous cholecystectomy, similar to the previous study. Pancreas: Normal Spleen: Chronic benign appearing 1.7 cm low-density, not clinically relevant. Adrenals/Urinary Tract: The adrenal glands are normal. Renal size is normal. The kidneys are poorly functioning without visible excretion. No focal renal lesion. No hydroureteronephrosis. Bladder appears normal. Stomach/Bowel: No bowel pathology is seen. No evidence of ileus or obstruction. No evidence of diverticulosis or diverticulitis. Vascular/Lymphatic: Aortic atherosclerosis. No aneurysm. Atherosclerosis of the branch vessels. IVC is normal. No adenopathy. Small retroperitoneal phleboliths. Reproductive: Previous hysterectomy. No pelvic mass. 1.5 cm fatty area associated with the left ovary, possibly a small benign dermoid, unchanged. Other: No free fluid or air. Musculoskeletal: Lower lumbar degenerative changes most severe at L4-5. IMPRESSION: No acute abdominal or pelvic finding. No specific cause of left lower quadrant pain. No sign of diverticulosis or diverticulitis or other acute bowel pathology. Interstitial pulmonary density, improved since the previous study. This could represent resolving pneumonia or edema. No effusions. Previous cholecystectomy and hysterectomy. Poorly functioning/nonfunctioning kidneys. Electronically Signed   By: Nelson Chimes M.D.   On: 02/05/2019 13:48   Dg Chest Port 1 View  Result Date: 02/18/2019 CLINICAL DATA:  Chest pain and  shortness of breath EXAM: PORTABLE CHEST 1 VIEW COMPARISON:  02/05/2019 FINDINGS: Cardiac shadow  remains enlarged. Aortic calcifications are again seen. Dialysis catheter is again noted on the right. Diffuse alveolar edema is noted similar to that seen on the prior exam. Some more focal scarring in right base is noted. No bony abnormality is noted. IMPRESSION: Changes consistent with CHF similar to that seen on the prior plain film. Electronically Signed   By: Inez Catalina M.D.   On: 02/18/2019 13:30   Dg Chest Portable 1 View  Result Date: 02/03/2019 CLINICAL DATA:  Shortness of breath. EXAM: PORTABLE CHEST 1 VIEW COMPARISON:  Radiograph of Jan 19, 2019. FINDINGS: Stable cardiomegaly. Right internal jugular dialysis catheter is unchanged in position. No pneumothorax or pleural effusion is noted. Stable bilateral reticular densities are noted concerning for edema. Minimal pleural effusions may be present. Bony thorax is unremarkable. IMPRESSION: Stable bilateral reticular densities are noted concerning for pulmonary edema. Minimal pleural effusions. Electronically Signed   By: Marijo Conception M.D.   On: 02/03/2019 13:52   Ir Thrombectomy Av Fistula W/thrombolysis Inc/shunt/img Right  Result Date: 02/20/2019 INDICATION: Occluded right forearm AV loop graft EXAM: Right forearm AV declot procedure thrombectomy thrombo lysis and 6 mm venous angioplasty MEDICATIONS: 3000 units heparin 2 tPA ANESTHESIA/SEDATION: Moderate Sedation Time:  35 minutes 2 mg Versed, 50 mcg fentanyl The patient was continuously monitored during the procedure by the interventional radiology nurse under my direct supervision. FLUOROSCOPY TIME:  Fluoroscopy Time: 4 minutes 20 seconds (9.0 mGy). COMPLICATIONS: None immediate. PROCEDURE: Informed written consent was obtained from the patient after a thorough discussion of the procedural risks, benefits and alternatives. All questions were addressed. Maximal Sterile Barrier Technique was  utilized including caps, mask, sterile gowns, sterile gloves, sterile drape, hand hygiene and skin antiseptic. A timeout was performed prior to the initiation of the procedure. Under sterile conditions and local anesthesia, ultrasound micropuncture access performed of the arterial side of the graft to sites. Images obtained for documentation of the venous access. Six French sheath inserted over Bentson guidewires. Catheter guidewire advanced across the venous outflow. Outflow venogram performed brachial vein the upper arm is patent. Pull-back venogram confirms thrombotic occlusion of outflow vein anastomosis at the elbow. 2 mg tPA instilled for thrombolysis. Mechanical thrombectomy performed with the Angiojet device throughout the entire loop graft utilizing both sheaths. Overlapping 6 mm angioplasty performed throughout the graft and at the venous anastomosis. Fogarty embolectomy performed of the arterial plug to re-established inflow. Both sheaths were back bled and syringe aspirated. Initial Shuntogram demonstrates restored flow and patency with low resistance flow through the graft. Residual intragraft nonocclusive thrombus noted and mild narrowing of the venous anastomosis. This responded to additional overlapping venous angioplasty. Reflux across the arterial anastomosis demonstrates patency of the arterial limb of the graft. Complete shuntogram demonstrates patency of the outflow upper arm veins. Centrally the axillary, subclavian and innominate veins are patent. SVC patent. No central stenosis or occlusion. Access removed. Hemostasis obtained with pursestring sutures at both sites. Patient tolerated the procedure well. No immediate complication IMPRESSION: Successful right forearm AV graft declot procedure requiring thrombo lysis, thrombectomy and venous angioplasty to restore flow. ACCESS: This access remains amenable to future percutaneous interventions as clinically indicated. Electronically Signed   By:  Jerilynn Mages.  Shick M.D.   On: 02/20/2019 16:06   Vas [place] Upper Extremity Venous Duplex  Result Date: 02/05/2019 UPPER VENOUS STUDY  Indications: Pain Performing Technologist: June Leap RDMS, RVT  Examination Guidelines: A complete evaluation includes B-mode imaging, spectral Doppler, color Doppler, and power Doppler as needed of  all accessible portions of each vessel. Bilateral testing is considered an integral part of a complete examination. Limited examinations for reoccurring indications may be performed as noted.  Right Findings: +----------+------------+---------+-----------+----------+--------------+  RIGHT      Compressible Phasicity Spontaneous Properties    Summary      +----------+------------+---------+-----------+----------+--------------+  IJV                                                      Not visualized  +----------+------------+---------+-----------+----------+--------------+  Subclavian                 Yes        Yes                                +----------+------------+---------+-----------+----------+--------------+  Axillary       Full        Yes        Yes                                +----------+------------+---------+-----------+----------+--------------+  Brachial       Full        Yes        Yes                                +----------+------------+---------+-----------+----------+--------------+  Radial         Full        Yes        Yes                                +----------+------------+---------+-----------+----------+--------------+  Ulnar          Full                                                      +----------+------------+---------+-----------+----------+--------------+  Cephalic       Full                                                      +----------+------------+---------+-----------+----------+--------------+  Basilic        Full                                                      +----------+------------+---------+-----------+----------+--------------+  Left  Findings: +----------+------------+---------+-----------+----------+--------------+  LEFT       Compressible Phasicity Spontaneous Properties    Summary      +----------+------------+---------+-----------+----------+--------------+  Subclavian  Not visualized  +----------+------------+---------+-----------+----------+--------------+  Summary:  Right: No evidence of deep vein thrombosis in the upper extremity. No evidence of superficial vein thrombosis in the upper extremity.  *See table(s) above for measurements and observations.  Diagnosing physician: Servando Snare MD Electronically signed by Servando Snare MD on 02/05/2019 at 12:42:34 PM.    Final       Subjective: Some sternal chest pain, sharp. Non-radiating.  Discharge Exam: Vitals:   02/21/19 1400 02/21/19 1430  BP: (!) 112/28 (!) 155/83  Pulse: 87 86  Resp: (!) 22 (!) 21  Temp:  97.6 F (36.4 C)  SpO2:  98%   Vitals:   02/21/19 1300 02/21/19 1330 02/21/19 1400 02/21/19 1430  BP: 114/65 111/72 (!) 112/28 (!) 155/83  Pulse: 89 93 87 86  Resp: 20 (!) 23 (!) 22 (!) 21  Temp:    97.6 F (36.4 C)  TempSrc:    Oral  SpO2:    98%  Weight:    53.6 kg  Height:        General: Pt is alert, awake, not in acute distress Cardiovascular: RRR, S1/S2 +, no rubs, no gallops Chest: reproducible pain over sternum Respiratory: CTA bilaterally, no wheezing, no rhonchi Abdominal: Soft, NT, ND, bowel sounds + Extremities: no edema, no cyanosis    The results of significant diagnostics from this hospitalization (including imaging, microbiology, ancillary and laboratory) are listed below for reference.     Microbiology: Recent Results (from the past 240 hour(s))  Blood Culture (routine x 2)     Status: None (Preliminary result)   Collection Time: 02/18/19 12:13 PM   Specimen: BLOOD  Result Value Ref Range Status   Specimen Description BLOOD SITE NOT SPECIFIED  Final   Special Requests   Final     BOTTLES DRAWN AEROBIC AND ANAEROBIC Blood Culture adequate volume   Culture   Final    NO GROWTH 3 DAYS Performed at [facility], [address], [place], [place] [address]    Report Status PENDING  Incomplete  Blood Culture (routine x 2)     Status: None (Preliminary result)   Collection Time: 02/18/19 12:18 PM   Specimen: BLOOD  Result Value Ref Range Status   Specimen Description BLOOD SITE NOT SPECIFIED  Final   Special Requests   Final    BOTTLES DRAWN AEROBIC ONLY Blood Culture results may not be optimal due to an inadequate volume of blood received in culture bottles   Culture   Final    NO GROWTH 3 DAYS Performed at [facility], [place] [address], [place], [place] [address]    Report Status PENDING  Incomplete  SARS Coronavirus 2     Status: None   Collection Time: 02/18/19 12:44 PM  Result Value Ref Range Status   SARS Coronavirus 2 NOT DETECTED NOT DETECTED Final    Comment: (NOTE) SARS-CoV-2 target nucleic acids are NOT DETECTED. The SARS-CoV-2 RNA is generally detectable in upper and lower respiratory specimens during the acute phase of infection.  Negative  results do not preclude SARS-CoV-2 infection, do not rule out co-infections with other pathogens, and should not be used as the sole basis for treatment or other patient management decisions.  Negative results must be combined with clinical observations, patient history, and epidemiological information. The expected result is Not Detected. Fact Sheet for Patients: http://www.biofiredefense.com/wp-content/uploads/2020/03/BIOFIRE-COVID -19-patients.pdf Fact Sheet for Healthcare Providers: http://www.biofiredefense.com/wp-content/uploads/2020/03/BIOFIRE-COVID -19-hcp.pdf This test is not yet approved or cleared by the [place] and  has been authorized for detection and/or diagnosis of SARS-CoV-2 by FDA under an Emergency Use Authorization (EUA).  This EUA will remain in effec t (meaning this  test can be used) for the duration of  the COVID-19 declaration under Section 564(b)(1) of the Act, 21 U.S.C. section 360bbb-3(b)(1), unless the authorization is terminated or revoked sooner. Performed at [facility], [place] [address], [place], [place] [address]   MRSA PCR Screening     Status: None   Collection Time: 02/19/19 10:20 AM   Specimen: Nasal Mucosa; Nasopharyngeal  Result Value Ref Range Status   MRSA by PCR NEGATIVE NEGATIVE Final    Comment:        The GeneXpert MRSA Assay (FDA approved for NASAL specimens only), is one component of a comprehensive MRSA colonization surveillance program. It is not intended to diagnose MRSA infection nor to guide or monitor treatment for MRSA infections. Performed at [facility], [place] [address], [place], [place] [address]      Labs: BNP (last 3 results) Recent Labs    06/20/18 2042 12/03/18 1209 02/03/19 1400  BNP >4,500.0* >4,500.0* >6,546.5*   Basic Metabolic Panel: Recent Labs  Lab 02/18/19 1724 02/19/19 0639 02/20/19 0524  NA 134* 138 136  K 6.1* 5.7* 4.1  CL 99 99 100  CO2 25 26 26   GLUCOSE 99 101* 109*  BUN 24* 31* 24*  CREATININE 7.26* 8.34* 6.38*  CALCIUM 8.8* 8.9 8.7*  PHOS  --  5.0*  --    Liver Function Tests: Recent Labs  Lab 02/18/19 1724 02/19/19 0639  AST 75*  --   ALT 20  --   ALKPHOS 102  --   BILITOT 1.5*  --   PROT 7.0  --   ALBUMIN 2.1* 2.0*   No results for input(s): LIPASE, AMYLASE in the last 168 hours. No results for input(s): AMMONIA in the last 168 hours. CBC: Recent Labs  Lab 02/18/19 1244 02/19/19 0639 02/20/19 0524  WBC 13.6* 16.0* 10.6*  NEUTROABS 9.7*  --   --   HGB 8.2* 7.6* 7.4*  HCT 27.0* 24.6* 24.3*  MCV 75.0* 74.3* 75.0*  PLT 287 258 215   Cardiac Enzymes: Recent Labs  Lab 02/18/19 1230 02/21/19 1216  TROPONINI 0.04* <0.03   BNP: Invalid input(s): POCBNP CBG: Recent Labs  Lab 02/20/19 0655 02/20/19 1105 02/20/19 1623 02/20/19 2139  02/21/19 0621  GLUCAP 87 125* 82 156* 73   D-Dimer No results for input(s): DDIMER in the last 72 hours. Hgb A1c No results for input(s): HGBA1C in the last 72 hours. Lipid Profile No results for input(s): CHOL, HDL, LDLCALC, TRIG, CHOLHDL, LDLDIRECT in the last 72 hours. Thyroid function studies No results for input(s): TSH, T4TOTAL, T3FREE, THYROIDAB in the last 72 hours.  Invalid input(s): FREET3 Anemia work up No results for input(s): VITAMINB12, FOLATE, FERRITIN, TIBC, IRON, RETICCTPCT in the last 72 hours. Urinalysis    Component Value Date/Time   COLORURINE YELLOW 12/03/2018 1208   APPEARANCEUR HAZY (A) 12/03/2018 1208   LABSPEC 1.020 12/03/2018 1208   PHURINE 6.0 12/03/2018 1208   GLUCOSEU 100 (A) 12/03/2018 1208   HGBUR LARGE (A) 12/03/2018 1208   HGBUR trace-intact 03/08/2010 1452   BILIRUBINUR NEGATIVE 12/03/2018 [place] 12/03/2018 1208   PROTEINUR >300 (A) 12/03/2018 1208   UROBILINOGEN 0.2 03/08/2010 1452   NITRITE NEGATIVE 12/03/2018 1208   LEUKOCYTESUR TRACE (A) 12/03/2018 1208   [facility] Invalid input(s): PROCALCITONIN,  WBC,  [place] [facility] Recent Results (from the past 240 hour(s))  Blood Culture (routine x 2)     Status: None (Preliminary result)   Collection Time: 02/18/19 12:13 PM   Specimen: BLOOD  Result Value Ref Range Status   Specimen Description BLOOD SITE NOT SPECIFIED  Final   Special Requests   Final    BOTTLES DRAWN AEROBIC AND ANAEROBIC Blood Culture adequate volume   Culture   Final    NO GROWTH 3 DAYS Performed at [facility], [address], [place], [place] [address]    Report Status PENDING  Incomplete  Blood Culture (routine x 2)     Status: None (Preliminary result)   Collection Time: 02/18/19 12:18 PM   Specimen: BLOOD  Result Value Ref Range Status   Specimen Description BLOOD SITE NOT SPECIFIED  Final   Special Requests   Final    BOTTLES DRAWN AEROBIC ONLY Blood Culture results may  not be optimal due to an inadequate volume of blood received in culture bottles   Culture   Final    NO GROWTH 3 DAYS Performed at [facility], [place] [address], [place], [place] [address]    Report Status PENDING  Incomplete  SARS Coronavirus 2     Status: None   Collection Time: 02/18/19 12:44 PM  Result Value Ref Range Status   SARS Coronavirus 2 NOT DETECTED NOT DETECTED Final    Comment: (NOTE) SARS-CoV-2 target nucleic acids are NOT DETECTED. The SARS-CoV-2 RNA is generally detectable in upper and lower respiratory specimens during the acute phase of infection.  Negative  results do not preclude SARS-CoV-2 infection, do not rule out co-infections with other pathogens, and should not be used as the sole basis for treatment or other patient management decisions.  Negative results must be combined with clinical observations, patient history, and epidemiological information. The expected result is Not Detected. Fact Sheet for Patients: http://www.biofiredefense.com/wp-content/uploads/2020/03/BIOFIRE-COVID -19-patients.pdf Fact Sheet for Healthcare Providers: http://www.biofiredefense.com/wp-content/uploads/2020/03/BIOFIRE-COVID -19-hcp.pdf This test is not yet approved or cleared by the [place] and  has been authorized for detection and/or diagnosis of SARS-CoV-2 by FDA under an Emergency Use Authorization (EUA).  This EUA will remain in effec t (meaning this test can be used) for the duration of  the COVID-19 declaration under Section 564(b)(1) of the Act, 21 U.S.C. section 360bbb-3(b)(1), unless the authorization is terminated or revoked sooner. Performed at [facility], [place] [address] [place], [place] [address]   MRSA PCR Screening     Status: None   Collection Time: 02/19/19 10:20 AM   Specimen: Nasal Mucosa; Nasopharyngeal  Result Value Ref Range Status   MRSA by PCR NEGATIVE NEGATIVE Final    Comment:        The GeneXpert MRSA Assay  (FDA approved for NASAL specimens only), is one component of a comprehensive MRSA colonization surveillance program. It is not intended to diagnose MRSA infection nor to guide or monitor treatment for MRSA infections. Performed at [facility], [address], [place], [place] [address]     SIGNED:   Cordelia Poche, MD [facility] 02/21/2019, 4:18 PM

## 2019-02-21 NOTE — Progress Notes (Signed)
Pt. son called asked for an update regarding mother. Given him report and he wants to speak to her mother. Asked to dialled room phone and was able to speak to the patient.

## 2019-02-23 ENCOUNTER — Other Ambulatory Visit (HOSPITAL_COMMUNITY): Payer: Self-pay | Admitting: Nephrology

## 2019-02-23 ENCOUNTER — Encounter (HOSPITAL_COMMUNITY): Payer: Self-pay

## 2019-02-23 DIAGNOSIS — T8249XA Other complication of vascular dialysis catheter, initial encounter: Secondary | ICD-10-CM

## 2019-02-24 LAB — CULTURE, BLOOD (ROUTINE X 2)
Culture: NO GROWTH
Culture: NO GROWTH
Special Requests: ADEQUATE

## 2019-02-25 ENCOUNTER — Encounter (HOSPITAL_COMMUNITY): Payer: Self-pay | Admitting: Interventional Radiology

## 2019-03-04 ENCOUNTER — Emergency Department (HOSPITAL_COMMUNITY): Payer: Medicare Other

## 2019-03-04 ENCOUNTER — Encounter (HOSPITAL_COMMUNITY): Payer: Self-pay | Admitting: Emergency Medicine

## 2019-03-04 ENCOUNTER — Inpatient Hospital Stay (HOSPITAL_COMMUNITY)
Admission: EM | Admit: 2019-03-04 | Discharge: 2019-04-11 | DRG: 207 | Disposition: E | Payer: Medicare Other | Attending: Internal Medicine | Admitting: Internal Medicine

## 2019-03-04 DIAGNOSIS — J9690 Respiratory failure, unspecified, unspecified whether with hypoxia or hypercapnia: Secondary | ICD-10-CM

## 2019-03-04 DIAGNOSIS — E785 Hyperlipidemia, unspecified: Secondary | ICD-10-CM | POA: Diagnosis present

## 2019-03-04 DIAGNOSIS — Z0189 Encounter for other specified special examinations: Secondary | ICD-10-CM

## 2019-03-04 DIAGNOSIS — F17200 Nicotine dependence, unspecified, uncomplicated: Secondary | ICD-10-CM | POA: Diagnosis present

## 2019-03-04 DIAGNOSIS — I5043 Acute on chronic combined systolic (congestive) and diastolic (congestive) heart failure: Secondary | ICD-10-CM | POA: Diagnosis present

## 2019-03-04 DIAGNOSIS — E1122 Type 2 diabetes mellitus with diabetic chronic kidney disease: Secondary | ICD-10-CM | POA: Diagnosis present

## 2019-03-04 DIAGNOSIS — T17908A Unspecified foreign body in respiratory tract, part unspecified causing other injury, initial encounter: Secondary | ICD-10-CM

## 2019-03-04 DIAGNOSIS — Z9115 Patient's noncompliance with renal dialysis: Secondary | ICD-10-CM

## 2019-03-04 DIAGNOSIS — I6389 Other cerebral infarction: Secondary | ICD-10-CM

## 2019-03-04 DIAGNOSIS — R001 Bradycardia, unspecified: Secondary | ICD-10-CM | POA: Diagnosis present

## 2019-03-04 DIAGNOSIS — E8770 Fluid overload, unspecified: Secondary | ICD-10-CM | POA: Diagnosis present

## 2019-03-04 DIAGNOSIS — Z978 Presence of other specified devices: Secondary | ICD-10-CM

## 2019-03-04 DIAGNOSIS — Z794 Long term (current) use of insulin: Secondary | ICD-10-CM

## 2019-03-04 DIAGNOSIS — I6340 Cerebral infarction due to embolism of unspecified cerebral artery: Secondary | ICD-10-CM | POA: Insufficient documentation

## 2019-03-04 DIAGNOSIS — G9349 Other encephalopathy: Secondary | ICD-10-CM | POA: Diagnosis present

## 2019-03-04 DIAGNOSIS — Z8673 Personal history of transient ischemic attack (TIA), and cerebral infarction without residual deficits: Secondary | ICD-10-CM

## 2019-03-04 DIAGNOSIS — J9600 Acute respiratory failure, unspecified whether with hypoxia or hypercapnia: Secondary | ICD-10-CM

## 2019-03-04 DIAGNOSIS — I313 Pericardial effusion (noninflammatory): Secondary | ICD-10-CM | POA: Diagnosis present

## 2019-03-04 DIAGNOSIS — R0602 Shortness of breath: Secondary | ICD-10-CM

## 2019-03-04 DIAGNOSIS — I471 Supraventricular tachycardia: Secondary | ICD-10-CM | POA: Diagnosis present

## 2019-03-04 DIAGNOSIS — J8489 Other specified interstitial pulmonary diseases: Secondary | ICD-10-CM | POA: Diagnosis not present

## 2019-03-04 DIAGNOSIS — F1410 Cocaine abuse, uncomplicated: Secondary | ICD-10-CM | POA: Diagnosis present

## 2019-03-04 DIAGNOSIS — J810 Acute pulmonary edema: Secondary | ICD-10-CM | POA: Diagnosis not present

## 2019-03-04 DIAGNOSIS — I480 Paroxysmal atrial fibrillation: Secondary | ICD-10-CM | POA: Diagnosis present

## 2019-03-04 DIAGNOSIS — F1110 Opioid abuse, uncomplicated: Secondary | ICD-10-CM | POA: Diagnosis present

## 2019-03-04 DIAGNOSIS — J9620 Acute and chronic respiratory failure, unspecified whether with hypoxia or hypercapnia: Secondary | ICD-10-CM

## 2019-03-04 DIAGNOSIS — Z7982 Long term (current) use of aspirin: Secondary | ICD-10-CM

## 2019-03-04 DIAGNOSIS — I4892 Unspecified atrial flutter: Secondary | ICD-10-CM | POA: Diagnosis present

## 2019-03-04 DIAGNOSIS — J9621 Acute and chronic respiratory failure with hypoxia: Secondary | ICD-10-CM | POA: Diagnosis not present

## 2019-03-04 DIAGNOSIS — F17210 Nicotine dependence, cigarettes, uncomplicated: Secondary | ICD-10-CM | POA: Diagnosis present

## 2019-03-04 DIAGNOSIS — E1165 Type 2 diabetes mellitus with hyperglycemia: Secondary | ICD-10-CM | POA: Diagnosis present

## 2019-03-04 DIAGNOSIS — Z20828 Contact with and (suspected) exposure to other viral communicable diseases: Secondary | ICD-10-CM | POA: Diagnosis present

## 2019-03-04 DIAGNOSIS — I132 Hypertensive heart and chronic kidney disease with heart failure and with stage 5 chronic kidney disease, or end stage renal disease: Secondary | ICD-10-CM | POA: Diagnosis present

## 2019-03-04 DIAGNOSIS — R9401 Abnormal electroencephalogram [EEG]: Secondary | ICD-10-CM | POA: Diagnosis present

## 2019-03-04 DIAGNOSIS — D631 Anemia in chronic kidney disease: Secondary | ICD-10-CM | POA: Diagnosis present

## 2019-03-04 DIAGNOSIS — Z4659 Encounter for fitting and adjustment of other gastrointestinal appliance and device: Secondary | ICD-10-CM

## 2019-03-04 DIAGNOSIS — N186 End stage renal disease: Secondary | ICD-10-CM | POA: Diagnosis present

## 2019-03-04 DIAGNOSIS — R1310 Dysphagia, unspecified: Secondary | ICD-10-CM | POA: Diagnosis not present

## 2019-03-04 DIAGNOSIS — Z9981 Dependence on supplemental oxygen: Secondary | ICD-10-CM

## 2019-03-04 DIAGNOSIS — I10 Essential (primary) hypertension: Secondary | ICD-10-CM | POA: Diagnosis not present

## 2019-03-04 DIAGNOSIS — I776 Arteritis, unspecified: Secondary | ICD-10-CM | POA: Diagnosis present

## 2019-03-04 DIAGNOSIS — B1920 Unspecified viral hepatitis C without hepatic coma: Secondary | ICD-10-CM | POA: Diagnosis present

## 2019-03-04 DIAGNOSIS — Z781 Physical restraint status: Secondary | ICD-10-CM

## 2019-03-04 DIAGNOSIS — IMO0001 Reserved for inherently not codable concepts without codable children: Secondary | ICD-10-CM

## 2019-03-04 DIAGNOSIS — R092 Respiratory arrest: Secondary | ICD-10-CM | POA: Diagnosis present

## 2019-03-04 DIAGNOSIS — Z66 Do not resuscitate: Secondary | ICD-10-CM | POA: Diagnosis not present

## 2019-03-04 DIAGNOSIS — Z515 Encounter for palliative care: Secondary | ICD-10-CM | POA: Diagnosis not present

## 2019-03-04 DIAGNOSIS — J189 Pneumonia, unspecified organism: Secondary | ICD-10-CM | POA: Diagnosis present

## 2019-03-04 DIAGNOSIS — I071 Rheumatic tricuspid insufficiency: Secondary | ICD-10-CM | POA: Diagnosis present

## 2019-03-04 DIAGNOSIS — Z992 Dependence on renal dialysis: Secondary | ICD-10-CM

## 2019-03-04 DIAGNOSIS — J439 Emphysema, unspecified: Secondary | ICD-10-CM | POA: Diagnosis present

## 2019-03-04 DIAGNOSIS — I4891 Unspecified atrial fibrillation: Secondary | ICD-10-CM

## 2019-03-04 DIAGNOSIS — E119 Type 2 diabetes mellitus without complications: Secondary | ICD-10-CM

## 2019-03-04 DIAGNOSIS — R4182 Altered mental status, unspecified: Secondary | ICD-10-CM | POA: Diagnosis not present

## 2019-03-04 LAB — CBC WITH DIFFERENTIAL/PLATELET
Abs Immature Granulocytes: 0.09 10*3/uL — ABNORMAL HIGH (ref 0.00–0.07)
Basophils Absolute: 0.1 10*3/uL (ref 0.0–0.1)
Basophils Relative: 1 %
Eosinophils Absolute: 0.2 10*3/uL (ref 0.0–0.5)
Eosinophils Relative: 1 %
HCT: 25.6 % — ABNORMAL LOW (ref 36.0–46.0)
Hemoglobin: 7.7 g/dL — ABNORMAL LOW (ref 12.0–15.0)
Immature Granulocytes: 1 %
Lymphocytes Relative: 15 %
Lymphs Abs: 1.9 10*3/uL (ref 0.7–4.0)
MCH: 21.8 pg — ABNORMAL LOW (ref 26.0–34.0)
MCHC: 30.1 g/dL (ref 30.0–36.0)
MCV: 72.5 fL — ABNORMAL LOW (ref 80.0–100.0)
Monocytes Absolute: 1.1 10*3/uL — ABNORMAL HIGH (ref 0.1–1.0)
Monocytes Relative: 9 %
Neutro Abs: 9.2 10*3/uL — ABNORMAL HIGH (ref 1.7–7.7)
Neutrophils Relative %: 73 %
Platelets: 288 10*3/uL (ref 150–400)
RBC: 3.53 MIL/uL — ABNORMAL LOW (ref 3.87–5.11)
RDW: 23.6 % — ABNORMAL HIGH (ref 11.5–15.5)
WBC: 12.5 10*3/uL — ABNORMAL HIGH (ref 4.0–10.5)
nRBC: 0 % (ref 0.0–0.2)

## 2019-03-04 LAB — COMPREHENSIVE METABOLIC PANEL
ALT: 23 U/L (ref 0–44)
AST: 43 U/L — ABNORMAL HIGH (ref 15–41)
Albumin: 2.1 g/dL — ABNORMAL LOW (ref 3.5–5.0)
Alkaline Phosphatase: 110 U/L (ref 38–126)
Anion gap: 11 (ref 5–15)
BUN: 41 mg/dL — ABNORMAL HIGH (ref 8–23)
CO2: 25 mmol/L (ref 22–32)
Calcium: 9.1 mg/dL (ref 8.9–10.3)
Chloride: 100 mmol/L (ref 98–111)
Creatinine, Ser: 7.67 mg/dL — ABNORMAL HIGH (ref 0.44–1.00)
GFR calc Af Amer: 6 mL/min — ABNORMAL LOW (ref >60)
GFR calc non Af Amer: 5 mL/min — ABNORMAL LOW (ref >60)
Glucose, Bld: 91 mg/dL (ref 70–99)
Potassium: 5 mmol/L (ref 3.5–5.1)
Sodium: 136 mmol/L (ref 135–145)
Total Bilirubin: 0.8 mg/dL (ref 0.3–1.2)
Total Protein: 7.8 g/dL (ref 6.5–8.1)

## 2019-03-04 LAB — POCT I-STAT EG7
Acid-Base Excess: 3 mmol/L — ABNORMAL HIGH (ref 0.0–2.0)
Bicarbonate: 26.9 mmol/L (ref 20.0–28.0)
Calcium, Ion: 1.19 mmol/L (ref 1.15–1.40)
HCT: 28 % — ABNORMAL LOW (ref 36.0–46.0)
Hemoglobin: 9.5 g/dL — ABNORMAL LOW (ref 12.0–15.0)
O2 Saturation: 72 %
Potassium: 5.1 mmol/L (ref 3.5–5.1)
Sodium: 135 mmol/L (ref 135–145)
TCO2: 28 mmol/L (ref 22–32)
pCO2, Ven: 37.7 mmHg — ABNORMAL LOW (ref 44.0–60.0)
pH, Ven: 7.461 — ABNORMAL HIGH (ref 7.250–7.430)
pO2, Ven: 36 mmHg (ref 32.0–45.0)

## 2019-03-04 LAB — LIPASE, BLOOD: Lipase: 46 U/L (ref 11–51)

## 2019-03-04 LAB — TROPONIN I (HIGH SENSITIVITY)
Troponin I (High Sensitivity): 33 ng/L — ABNORMAL HIGH (ref <18)
Troponin I (High Sensitivity): 30 ng/L — ABNORMAL HIGH (ref <18)

## 2019-03-04 LAB — SARS CORONAVIRUS 2 BY RT PCR (HOSPITAL ORDER, PERFORMED IN ~~LOC~~ HOSPITAL LAB): SARS Coronavirus 2: NEGATIVE

## 2019-03-04 MED ORDER — CHLORHEXIDINE GLUCONATE CLOTH 2 % EX PADS
6.0000 | MEDICATED_PAD | Freq: Every day | CUTANEOUS | Status: DC
  Administered 2019-03-05: 07:00:00 6 via TOPICAL

## 2019-03-04 MED ORDER — HYDROXYZINE HCL 25 MG PO TABS: 25.0000 mg | ORAL_TABLET | Freq: Three times a day (TID) | ORAL | Status: DC | PRN

## 2019-03-04 MED ORDER — HEPARIN SODIUM (PORCINE) 1000 UNIT/ML IJ SOLN
INTRAMUSCULAR | Status: AC
  Filled 2019-03-04: qty 1

## 2019-03-04 MED ORDER — AMLODIPINE BESYLATE 5 MG PO TABS
5.0000 mg | ORAL_TABLET | Freq: Every day | ORAL | Status: DC
  Administered 2019-03-05: 5 mg via ORAL
  Filled 2019-03-04: qty 1

## 2019-03-04 MED ORDER — CALCIUM CARBONATE ANTACID 1250 MG/5ML PO SUSP
500.0000 mg | Freq: Four times a day (QID) | ORAL | Status: DC | PRN
  Filled 2019-03-04: qty 5

## 2019-03-04 MED ORDER — CAMPHOR-MENTHOL 0.5-0.5 % EX LOTN
1.0000 "application " | TOPICAL_LOTION | Freq: Three times a day (TID) | CUTANEOUS | Status: DC | PRN
  Filled 2019-03-04: qty 222

## 2019-03-04 MED ORDER — INSULIN GLARGINE 100 UNIT/ML ~~LOC~~ SOLN: 5.0000 [IU] | Freq: Every day | SUBCUTANEOUS | Status: DC

## 2019-03-04 MED ORDER — METOPROLOL TARTRATE 5 MG/5ML IV SOLN
INTRAVENOUS | Status: AC
  Filled 2019-03-04: qty 5

## 2019-03-04 MED ORDER — DOCUSATE SODIUM 283 MG RE ENEM
1.0000 | ENEMA | RECTAL | Status: DC | PRN
  Filled 2019-03-04: qty 1

## 2019-03-04 MED ORDER — OXYCODONE HCL 5 MG PO TABS
5.0000 mg | ORAL_TABLET | Freq: Four times a day (QID) | ORAL | Status: DC | PRN
  Administered 2019-03-05 – 2019-03-06 (×4): 5 mg via ORAL
  Filled 2019-03-04 (×3): qty 1

## 2019-03-04 MED ORDER — CALCITRIOL 0.25 MCG PO CAPS
0.2500 ug | ORAL_CAPSULE | ORAL | Status: DC
  Administered 2019-03-10: 0.25 ug via ORAL
  Filled 2019-03-04 (×3): qty 1

## 2019-03-04 MED ORDER — GABAPENTIN 100 MG PO CAPS
100.0000 mg | ORAL_CAPSULE | Freq: Three times a day (TID) | ORAL | Status: DC
  Administered 2019-03-05 – 2019-03-09 (×13): 100 mg via ORAL
  Filled 2019-03-04 (×14): qty 1

## 2019-03-04 MED ORDER — ZOLPIDEM TARTRATE 5 MG PO TABS: 5.0000 mg | ORAL_TABLET | Freq: Every evening | ORAL | Status: DC | PRN

## 2019-03-04 MED ORDER — HEPARIN SODIUM (PORCINE) 5000 UNIT/ML IJ SOLN
5000.0000 [IU] | Freq: Three times a day (TID) | INTRAMUSCULAR | Status: DC
  Administered 2019-03-05 – 2019-03-11 (×16): 5000 [IU] via SUBCUTANEOUS
  Filled 2019-03-04 (×16): qty 1

## 2019-03-04 MED ORDER — SODIUM CHLORIDE 0.9% FLUSH
3.0000 mL | Freq: Two times a day (BID) | INTRAVENOUS | Status: DC
  Administered 2019-03-05 – 2019-03-13 (×17): 3 mL via INTRAVENOUS

## 2019-03-04 MED ORDER — INSULIN ASPART 100 UNIT/ML ~~LOC~~ SOLN: 0.0000 [IU] | Freq: Every day | SUBCUTANEOUS | Status: DC

## 2019-03-04 MED ORDER — ONDANSETRON HCL 4 MG/2ML IJ SOLN: 4.0000 mg | Freq: Four times a day (QID) | INTRAMUSCULAR | Status: DC | PRN

## 2019-03-04 MED ORDER — ONDANSETRON HCL 4 MG PO TABS: 4.0000 mg | ORAL_TABLET | Freq: Four times a day (QID) | ORAL | Status: DC | PRN

## 2019-03-04 MED ORDER — INSULIN ASPART 100 UNIT/ML ~~LOC~~ SOLN
0.0000 [IU] | Freq: Three times a day (TID) | SUBCUTANEOUS | Status: DC
  Administered 2019-03-05: 09:00:00 1 [IU] via SUBCUTANEOUS
  Administered 2019-03-06: 12:00:00 2 [IU] via SUBCUTANEOUS

## 2019-03-04 MED ORDER — NEPRO/CARBSTEADY PO LIQD
237.0000 mL | Freq: Three times a day (TID) | ORAL | Status: DC | PRN
  Filled 2019-03-04: qty 237

## 2019-03-04 MED ORDER — METOPROLOL TARTRATE 5 MG/5ML IV SOLN
5.0000 mg | Freq: Once | INTRAVENOUS | Status: AC
  Administered 2019-03-06: 20:00:00 5 mg via INTRAVENOUS
  Filled 2019-03-04: qty 5

## 2019-03-04 MED ORDER — FENTANYL CITRATE (PF) 100 MCG/2ML IJ SOLN
50.0000 ug | Freq: Once | INTRAMUSCULAR | Status: AC
  Administered 2019-03-04: 50 ug via INTRAVENOUS
  Filled 2019-03-04: qty 2

## 2019-03-04 MED ORDER — IOPAMIDOL (ISOVUE-300) INJECTION 61%
60.0000 mL | Freq: Once | INTRAVENOUS | Status: AC | PRN
  Administered 2019-03-04: 60 mL via INTRAVENOUS

## 2019-03-04 MED ORDER — SORBITOL 70 % SOLN
30.0000 mL | Status: DC | PRN
  Filled 2019-03-04: qty 30

## 2019-03-04 MED ORDER — SEVELAMER CARBONATE 800 MG PO TABS
1600.0000 mg | ORAL_TABLET | Freq: Three times a day (TID) | ORAL | Status: DC
  Administered 2019-03-05 – 2019-03-07 (×3): 1600 mg via ORAL
  Filled 2019-03-04 (×6): qty 2

## 2019-03-04 MED ORDER — POLYETHYLENE GLYCOL 3350 17 G PO PACK: 17.0000 g | PACK | Freq: Every day | ORAL | Status: DC | PRN

## 2019-03-04 MED ORDER — HEPARIN SODIUM (PORCINE) 1000 UNIT/ML DIALYSIS: 3000.0000 [IU] | Freq: Once | INTRAMUSCULAR | Status: DC

## 2019-03-04 MED ORDER — NICOTINE 14 MG/24HR TD PT24
14.0000 mg | MEDICATED_PATCH | Freq: Every day | TRANSDERMAL | Status: DC
  Administered 2019-03-07 – 2019-03-13 (×7): 14 mg via TRANSDERMAL
  Filled 2019-03-04 (×9): qty 1

## 2019-03-04 MED ORDER — PANTOPRAZOLE SODIUM 40 MG PO TBEC
40.0000 mg | DELAYED_RELEASE_TABLET | Freq: Every day | ORAL | Status: DC
  Administered 2019-03-05 – 2019-03-06 (×2): 40 mg via ORAL
  Filled 2019-03-04 (×2): qty 1

## 2019-03-04 MED ORDER — ASPIRIN EC 81 MG PO TBEC
81.0000 mg | DELAYED_RELEASE_TABLET | Freq: Every day | ORAL | Status: DC
  Administered 2019-03-05 – 2019-03-09 (×5): 81 mg via ORAL
  Filled 2019-03-04 (×7): qty 1

## 2019-03-04 MED ORDER — DARBEPOETIN ALFA 150 MCG/0.3ML IJ SOSY
PREFILLED_SYRINGE | INTRAMUSCULAR | Status: AC
  Filled 2019-03-04: qty 0.3

## 2019-03-04 NOTE — Progress Notes (Signed)
Pt in HD, taken off BIPAP, placed on 6L [place]. Pt states her breathing is better, but appears to be somewhat SOB when talking. Pt wanting food and drink. RN to notify for RT if pt's breathing gets worse. RT will continue to monitor.

## 2019-03-04 NOTE — ED Notes (Addendum)
Patient is hyperventilating, turning from side to side, stating "my pain medicine stop working." She is asking for more pain medicine

## 2019-03-04 NOTE — H&P (Signed)
History and Physical    Rhonda Lynch XHB:[id number] DOB: 10/10/1955 DOA: 02/20/2019  PCP: Clent Demark, PA-C Consultants:  Nephrology Patient coming from: Home - lives alone; NOK: Rhonda, Lynch, [phone]  Chief Complaint: SOB  HPI: Rhonda Lynch is a 63 y.o. female with medical history significant of polysubstance abuse; CVA; nocturnal O2 requirement; HTN; DM; ESRD on TTS HD; and hepatitis C presenting with SOB.  She is unable to provide history due to acute SOB - although I was present during nephrology evaluation, when she was lying comfortably in the bed.  Nursing notes report repeated episodes of hyperventilation.  She also defecated in the floor of the room.  At the time of my evaluation, she was comfortable-appearing and then began hyperventilating and shot up in bed and sat up panting and reporting SOB.  BIPAP ordered.   ED Course:  Epigastric pain, SOB.  Abd CT with ?PNA/pulm edema.  TTS HD - went yesterday but left early due to another appt scheduled (frequently leaves early).  Tachypnea, elevated WBC count.  Nephrology consulted, plan for HD.  Needs medical admission.  ?antibiotics - not given.  Review of Systems: As per HPI; otherwise review of systems reviewed and negative.   Ambulatory Status:  Ambulates without assistance  Past Medical History:  Diagnosis Date   Diabetes mellitus without complication ([place])    ESRD (end stage renal disease) on dialysis ([place])    "TTS; [place]; they're moving me to another one" (09/22/2018)   Hepatitis C    diagnosed 09/2018   Hypertension    Oxygen deficiency    2 L at night   Stroke [facility])    Substance abuse ([place])    has been clean for 4 months     Past Surgical History:  Procedure Laterality Date   AV FISTULA PLACEMENT Left 03/05/2018   Procedure: ARTERIOVENOUS (AV) FISTULA CREATION  LEFT UPPER EXTREMITY;  Surgeon: [name], MD;  Location: [place];  Service: Vascular;  Laterality: Left;   AV  FISTULA PLACEMENT Left 07/21/2018   Procedure: ARTERIOVENOUS (AV) FISTULA CREATION;  Surgeon: Marty Heck, MD;  Location: [place];  Service: Vascular;  Laterality: Left;   AV FISTULA PLACEMENT Left 10/27/2018   Procedure: INSERTION OF ARTERIOVENOUS (AV) GORE-TEX GRAFT UPPER ARM;  Surgeon: Marty Heck, MD;  Location: [place];  Service: Vascular;  Laterality: Left;   AV FISTULA PLACEMENT Right 01/19/2019   Procedure: INSERTION OF ARTERIOVENOUS GRAFT RIGHT ARM;  Surgeon: [name], MD;  Location: [place];  Service: Vascular;  Laterality: Right;   [place] Left 09/29/2018   Procedure: BASILIC VEIN TRANSPOSITION SECOND STAGE LEFT UPPER ARM;  Surgeon: Marty Heck, MD;  Location: [place];  Service: Vascular;  Laterality: Left;   HEMATOMA EVACUATION Left 03/06/2018   Procedure: EVACUATION HEMATOMA LEFT ARM;  Surgeon: Angelia Mould, MD;  Location: [place];  Service: Vascular;  Laterality: Left;   I&D EXTREMITY Left 10/06/2018   Procedure: IRRIGATION AND DEBRIDEMENT ARM;  Surgeon: Marty Heck, MD;  Location: [place];  Service: Vascular;  Laterality: Left;   INSERTION OF DIALYSIS CATHETER Left 04/29/2018   Procedure: INSERTION OF DIALYSIS CATHETER LEFT INTERNAL JUGULAR;  Surgeon: Angelia Mould, MD;  Location: [facility] OR;  Service: Vascular;  Laterality: Left;   INSERTION OF DIALYSIS CATHETER Right 01/19/2019   Procedure: INSERTION OF HEMO DIALYSIS  PALINDROME TUNNELED CATHETER;  Surgeon: [name], MD;  Location: [place];  Service: Vascular;  Laterality: Right;   IR FLUORO GUIDE CV LINE RIGHT  02/18/2018   IR FLUORO GUIDE CV LINE RIGHT  02/26/2018   IR FLUORO GUIDE CV LINE RIGHT  01/14/2019   IR REMOVAL TUN CV CATH W/O FL  04/27/2018   IR THROMBECTOMY AV FISTULA W/THROMBOLYSIS INC/SHUNT/IMG RIGHT  02/20/2019   IR US GUIDE VASC ACCESS RIGHT  02/18/2018   IR US GUIDE VASC ACCESS RIGHT  01/14/2019   IR US GUIDE VASC ACCESS RIGHT  02/20/2019   TEE  WITHOUT CARDIOVERSION N/A 04/29/2018   Procedure: TRANSESOPHAGEAL ECHOCARDIOGRAM (TEE);  Surgeon: Sueanne Margarita, MD;  Location: [facility] ENDOSCOPY;  Service: Cardiovascular;  Laterality: N/A;   THROMBECTOMY AND REVISION OF ARTERIOVENTOUS (AV) GORETEX  GRAFT Left 12/11/2018   Procedure: THROMBECTOMY AND  REVISION  ARTERIOVENTOUS GORETEX  GRAFT LEFT ARM;  Surgeon: [name], MD;  Location[facility] OR;  Service: Vascular;  Laterality: Left;   VENOGRAM Left 10/27/2018   Procedure: LEFT CENTRAL VENOGRAM;  Surgeon: Marty Heck, MD;  Location: [facility] OR;  Service: Vascular;  Laterality: Left;    Social History   Socioeconomic History   Marital status: Single    Spouse name: Not on file   Number of children: Not on file   Years of education: Not on file   Highest education level: Not on file  Occupational History   Not on file  Social Needs   Financial resource strain: Not on file   Food insecurity    Worry: Not on file    Inability: Not on file   Transportation needs    Medical: Not on file    Non-medical: Not on file  Tobacco Use   Smoking status: Current Every Day Smoker    Packs/day: 0.50    Years: 48.00    Pack years: 24.00    Types: Cigarettes   Smokeless tobacco: Never Used   Tobacco comment: trying to quit ; smoking 1 1/2 cigarettes/day  (09/22/2018)  Substance and Sexual Activity   Alcohol use: Not Currently    Comment: 09/22/2018 "stopped about 6 months ago"   Drug use: Yes    Types: Cocaine, Heroin    Comment: See notes   Sexual activity: Not Currently    Birth control/protection: None  Lifestyle   Physical activity    Days per week: Not on file    Minutes per session: Not on file   Stress: Not on file  Relationships   Social connections    Talks on phone: Not on file    Gets together: Not on file    Attends religious service: Not on file    Active member of club or organization: Not on file    Attends meetings of clubs or organizations: Not on  file    Relationship status: Not on file   Intimate partner violence    Fear of current or ex partner: Not on file    Emotionally abused: Not on file    Physically abused: Not on file    Forced sexual activity: Not on file  Other Topics Concern   Not on file  Social History Narrative   Per notes, Pt lives with her son.    Allergies  Allergen Reactions   Acetaminophen Nausea And Vomiting    Patient states Acetaminophen and Acetaminophen containing products make her nauseated. She demonstrated this 06/21/18 with nausea followed by emesis.   Hydroxyzine Itching    Family History  Problem Relation Age of Onset   Autoimmune disease  Neg Hx     Prior to Admission medications   Medication Sig Start Date End Date Taking? Authorizing Provider  amLODipine (NORVASC) 5 MG tablet Take 1 tablet (5 mg total) by mouth at bedtime. 02/07/19   Domenic Polite, MD  aspirin 81 MG EC tablet Take 1 tablet (81 mg total) by mouth daily. 02/07/19   Domenic Polite, MD  B Complex-C-Zn-Folic Acid (DIALYVITE 315-VVOH 15) 0.8 MG TABS Take 1 tablet by mouth daily. 11/04/18   [provider]  Blood Glucose Monitoring Suppl (ACCU-CHEK AVIVA) device Use as instructed 10/29/18 10/29/19  Isabelle Course, MD  calcitRIOL (ROCALTROL) 0.25 MCG capsule Take 1 capsule (0.25 mcg total) by mouth every Monday, Wednesday, and Friday with hemodialysis. Patient taking differently: Take 0.25 mcg by mouth every Tuesday, Thursday, and Saturday at 6 PM.  02/09/19   Domenic Polite, MD  diclofenac sodium (VOLTAREN) 1 % GEL Apply 2 g topically 4 (four) times daily. 02/21/19   Mariel Aloe, MD  fluticasone (FLONASE) 50 MCG/ACT nasal spray Place 2 sprays into both nostrils as needed for allergies.     [provider]  gabapentin (NEURONTIN) 100 MG capsule Take 1 capsule (100 mg total) by mouth 2 (two) times daily for 30 days. Patient taking differently: Take 100 mg by mouth 3 (three) times daily.  02/07/19 03/09/19   Domenic Polite, MD  glucose blood (ACCU-CHEK AVIVA) test strip Use twice per day. 10/29/18   Isabelle Course, MD  hydrocortisone cream 1 % Apply 1 application topically 2 (two) times daily. Patient taking differently: Apply 1 application topically 2 (two) times daily as needed for itching.  01/20/19   Pokhrel, Corrie Mckusick, MD  insulin aspart (NOVOLOG) 100 UNIT/ML injection Inject 5 Units into the skin 3 (three) times daily with meals. Patient taking differently: Inject 3 Units into the skin 3 (three) times daily with meals.  10/13/18   Alphonzo Grieve, MD  insulin glargine (LANTUS) 100 UNIT/ML injection Inject 0.05 mLs (5 Units total) into the skin at bedtime. 02/07/19   Domenic Polite, MD  Insulin Syringe-Needle U-100 (INSULIN SYRINGE .3CC/31GX5/16") 31G X 5/16" 0.3 ML MISC Inject 1 each into the skin 4 (four) times daily. 06/27/18   Clent Demark, PA-C  Lancets (ACCU-CHEK SOFT [facility]) lancets Use twice per day. 10/29/18   Isabelle Course, MD  oxyCODONE (OXY IR/ROXICODONE) 5 MG immediate release tablet Take 1 tablet (5 mg total) by mouth every 6 (six) hours as needed for moderate pain or severe pain. 02/07/19   Domenic Polite, MD  pantoprazole (PROTONIX) 40 MG tablet Take 1 tablet (40 mg total) by mouth daily. 02/07/19   Domenic Polite, MD  polyethylene glycol (MIRALAX / GLYCOLAX) 17 g packet Take 17 g by mouth daily. Patient taking differently: Take 17 g by mouth daily as needed for mild constipation or moderate constipation.  02/07/19   Domenic Polite, MD  sevelamer carbonate (RENVELA) 800 MG tablet Take 2 tablets (1,600 mg total) by mouth 3 (three) times daily with meals. 02/07/19   Domenic Polite, MD    Physical Exam: Vitals:   03/01/2019 1545 02/24/2019 1600 03/06/2019 1615 02/10/2019 1630  BP: (!) 166/99 (!) 170/96 (!) 164/78 (!) 171/65  Pulse: (!) 33 (!) 37 (!) 111 85  Resp:    (!) 24  Temp:      TempSrc:      SpO2: 99% 92% (!) 81% 100%      General:  Somewhat histrionic, dramatic  Eyes:   PERRL, EOMI, normal  lids, iris  ENT:  grossly normal hearing, lips & tongue, mmm  Neck:  no LAD, masses or thyromegaly  Cardiovascular:  RR with mild tachycardia, no m/r/g. 1+ LE edema.   Respiratory:   Diffuse rales with increased respiratory effort.  Abdomen:  soft, NT, ND, NABS  Back:   normal alignment, no CVAT  Skin:  no rash or induration seen on limited exam  Musculoskeletal:  grossly normal tone BUE/BLE, good ROM, no bony abnormality  Psychiatric:  Dramatic mood and affect, otherwise unable to assess  Neurologic:  Unable to perform    Radiological Exams on Admission: Ct Abdomen Pelvis W Contrast  Result Date: 02/22/2019 CLINICAL DATA:  Abdominal pain anteriorly EXAM: CT ABDOMEN AND PELVIS WITH CONTRAST TECHNIQUE: Multidetector CT imaging of the abdomen and pelvis was performed using the standard protocol following bolus administration of intravenous contrast. CONTRAST:  18mL ISOVUE-300 IOPAMIDOL (ISOVUE-300) INJECTION 61% COMPARISON:  02/05/2019, 12/03/2018, 04/20/2018 FINDINGS: Lower chest: Bilateral interstitial and alveolar airspace disease in the right middle lobe, right lower lobe, lingula and left lower lobe. Bilateral small pleural effusions. Differential considerations include unresolved pneumonia including atypical pneumonia versus an element of pulmonary edema. New small pericardial effusion. Hepatobiliary: No focal liver abnormality is seen. Status post cholecystectomy. No biliary dilatation. Pancreas: Unremarkable. No pancreatic ductal dilatation or surrounding inflammatory changes. Spleen: Normal spleen size. 17 mm hypodense splenic mass unchanged compared with multiple prior examinations. Adrenals/Urinary Tract: Adrenal glands are unremarkable. Kidneys are normal, without renal calculi, focal lesion, or hydronephrosis. Bladder is unremarkable. Bilateral nonspecific perinephric stranding. Stomach/Bowel: Stomach is within normal limits. Appendix appears normal. No  evidence of bowel wall thickening, distention, or inflammatory changes. Vascular/Lymphatic: Normal caliber abdominal aorta with mild atherosclerosis. No lymphadenopathy. Reproductive: Prior hysterectomy. 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 Other: No fluid collection or hematoma. Small amount of pelvic free fluid. Small amount of pelvic free fluid. Interval worsening anasarca. Musculoskeletal: No acute osseous abnormality. No aggressive osseous lesion. IMPRESSION: 1. No acute abdominal or pelvic pathology. 2. Interval worsening of anasarca.  Small volume pelvic free fluid. 3. Bilateral interstitial and alveolar airspace disease in the right middle lobe, right lower lobe, lingula and left lower lobe. Bilateral small pleural effusions. Differential considerations include unresolved pneumonia including atypical pneumonia versus an element of pulmonary edema. 4. New small pericardial effusion. Electronically Signed   By: Kathreen Devoid   On: 02/16/2019 12:46   Dg Chest Portable 1 View  Result Date: 03/06/2019 CLINICAL DATA:  Chest pain EXAM: PORTABLE CHEST 1 VIEW COMPARISON:  02/18/2019 FINDINGS: Cardiomegaly and vascular pedicle widening. Diffuse interstitial coarsening. Dialysis catheter on the right with tip at the right atrium. Probable small pleural effusions. IMPRESSION: Cardiomegaly with pulmonary edema. Electronically Signed   By: Monte Fantasia M.D.   On: 02/12/2019 10:53    EKG: Independently reviewed.  NSR with rate 89; nonspecific ST changes with no evidence of acute ischemia; NSCSLT   Labs on Admission: I have personally reviewed the available labs and imaging studies at the time of the admission.  Pertinent labs:   VBG: 7.461/37.7/26.9 BUN 41/Creatinine 7.67/GFR 6 Albumin 2.1 HS troponin 33, 30 WBC 12.5 Hgb 7.7 - stable COVID negative today and also 6/10, 5/27, 5/26, 5/3, 3/27, 3/25  Assessment/Plan Principal Problem:   Volume  overload Active Problems:   TOBACCO ABUSE   Acute respiratory failure (HCC)   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[place])   Acute pulmonary edema ([place])   Hypertension  Insulin dependent diabetes mellitus (HCC)   End-stage renal disease on hemodialysis (HCC)   Volume overload in an ESRD on HD patient -Suspect this is related to HD non-compliance leading to volume overload -She does have possible underlying lung disease which may need further evaluation (see below) -Since she is dialysis-dependent, she is unable to clear the excess fluid -She is likely to benefit from serial HD both today and tomorrow and may be appropriate for d/c to home tomorrow after HD -For now, will place on BIPAP until able to receive HD -Nephrology has seen the patient -Nephrology prn order set was used -Resume TTS HD after d/c and encourage compliance -Continue Renvela and Calcitriol  Acute respiratory failure -Patient with volume overload and pulmonary edema with also h/o BOOP -Will repeat CXR tomorrow after HD -If still abnormal, consider pulm consultation -For now, BIPAP until able to have HD  HTN -Continue Norvasc  IDDM -Last A1c was 4.8 in 4/20 -Hold Lantus -Cover with sensitive-scale SSI  Cocaine abuse -Cessation encouraged; this should be encouraged on an ongoing basis -UDS ordered  Tobacco dependence -Encourage cessation.   -Patch ordered     Note: This patient has been tested and is negative for the novel coronavirus COVID-19.  DVT prophylaxis: Heparin Code Status:  Full - confirmed with patient Family Communication: None present; I spoke with her son by telephone Disposition Plan:  Home once clinically improved Consults called: Nephrology Admission status: It is my clinical opinion that referral for OBSERVATION is reasonable and necessary in this patient based on the above information provided. The aforementioned taken together are felt to place the patient at  high risk for further clinical deterioration. However it is anticipated that the patient may be medically stable for discharge from the hospital within 24 to 48 hours.    Karmen Bongo MD [facility]   How to contact the [facility] Attending or Consulting provider [place] or covering provider during after hours [place], for this patient?  1. Check the care team in [facility] and look for a) attending/consulting TRH provider listed and b) the [facility] team listed 2. Log into [email or web address] and use [place]'s universal password to access. If you do not have the password, please contact the hospital operator. 3. Locate the [facility] provider you are looking for under [facility] and page to a number that you can be directly reached. 4. If you still have difficulty reaching the provider, please page the [facility] (Director on Call) for the Hospitalists listed on amion for assistance.   03/01/2019, 4:47 PM

## 2019-03-04 NOTE — Progress Notes (Signed)
Pt heart rate sustaining in the 150's noted at HR 164 for about 5 mins; Dr. Louie Boston called and notified; she ordered metoprolol 5 mg Metoprolol IV once.

## 2019-03-04 NOTE — ED Triage Notes (Signed)
Patient from home via [facility] with reports of abdominal pain. Reports 2 episodes of diarrhea, and has not been feeling well since her dialysis yesterday(which was not completed).  Guarding right upper quad

## 2019-03-04 NOTE — Progress Notes (Signed)
[facility] Progress Note  Subjective: asked to see pt for recurrent CHF/ SOB, ESRD pt . Presented to ED w/ diarrhea x 2, abd pain, not feeling well. Then started to hyperventilate. Had 2 hrs HD yest.  No n/v, diarrhea, no fevers. CXR shows pulm edema.   Vitals:   02/23/2019 1115 02/22/2019 1130 03/07/2019 1145 02/10/2019 1245  BP: (!) 161/75 (!) 181/92 (!) 180/94 (!) 172/88  Pulse:      Resp: (!) 25 19 17  (!) 35  Temp:      TempSrc:      SpO2:       Exam:  Groggy, irritable, ^wob, not in severe distress  +jvd  Chest bilat rales 1/2 up  Cor reg no rub or gallop or sig murmur  Abd soft ntnd , no ascites  ext 1+ pretib edema  NF, Ox 3  CXR - CM w/ pulm edema   Dialysis Orders: TTS [facility]   4h  53.5kg  2/2.5 bath  (?R AVG) / TDC   Hep 3000  - mircera 225 q 2wks  - venofer 50/wk  - hect 4ug tiw   Assessment/Plan: 1. SOB/ pulm edema/ acute on chron resp failure: has chronic ground glass opacities/ hx BOOP 6/19 treated w/ steroids.  Suspect some vol overload w/ missed HD although CXR at baseline is significantly abnormal and not sure she doesn't have chronic lung disease. Would suggest get involved again at some point for ? Chronic lung disease. For today will plan HD asap upstairs, low threshold for Bipap in the meantime. Will follow.  2. Abd pain/ chest pain: trop negative 3. ESRD- TTS. HD today.  AVGG(placed 5/11) had first use here on 6/11. On 6/12 IR did declot.  AVG is open and pt states they are using it, still has TDC.  4. HTN/ volume - BP's high in ED, get vol down w/ HD 5. Anemia ckd - get records 6. Nutrition- renal carb mod/vit 7. DM 8. Hx CVA 9. Hep C +  [facility] 02/24/2019, 3:28 PM  Iron/TIBC/Ferritin/ %Sat    Component Value Date/Time   IRON 30 02/05/2019 0806   TIBC 204 (L) 02/05/2019 0806   FERRITIN 722 (H) 02/05/2019 0806   IRONPCTSAT 15 02/05/2019 0806   Recent Labs  Lab 02/26/2019 1049 02/28/2019 1057  NA 136  135  K 5.0 5.1  CL 100  --   CO2 25  --   GLUCOSE 91  --   BUN 41*  --   CREATININE 7.67*  --   CALCIUM 9.1  --   ALBUMIN 2.1*  --    Recent Labs  Lab 03/06/2019 1049  AST 43*  ALT 23  ALKPHOS 110  BILITOT 0.8  PROT 7.8   Recent Labs  Lab 02/27/2019 1049 02/21/2019 1057  WBC 12.5*  --   HGB 7.7* 9.5*  HCT 25.6* 28.0*  PLT 288  --

## 2019-03-04 NOTE — ED Notes (Signed)
Patient placed on BiPaP.

## 2019-03-04 NOTE — ED Notes (Signed)
Patient transported to Dialysis

## 2019-03-04 NOTE — ED Notes (Signed)
Critical Lab results given to Nurse Kehinde.

## 2019-03-04 NOTE — ED Provider Notes (Signed)
[facility] EMERGENCY DEPARTMENT Provider Note   CSN: [id number] Arrival date & time: 02/27/2019  7673    History   Chief Complaint Chief Complaint  Patient presents with  . Abdominal Pain    HPI Rhonda Lynch is a 63 y.o. female with end-stage renal disease dialysis Tuesday Thursday Saturday, CVA, substance abuse, hypertension, hepatitis, BOOP diabetes presents to emergency department today with chief complaint of abdominal pain and shortness of breath x1 day.  Patient states she had dialysis yesterday but was only able to complete 2 hours because she had another scheduled appointment. Pain is located in epigastric area and radiates into her chest.  She states it is 10 out of 10 in severity.  She did not take anything for the pain prior to arrival.  She has not felt pain like this before. Abdominal surgical history includes cholecystectomy. Also reports 2 episodes of non bloody diarrhea yesterday.  Pt also reports progressive worsening dyspnea, present at rest and with exertion.  She denies any fever, recent weight gain or swelling in her legs, nausea, vomiting, urinary symptoms, cough, black tarry stool. History provided by patient with additional history obtained from chart review.    Past Medical History:  Diagnosis Date  . Diabetes mellitus without complication ([place])   . ESRD (end stage renal disease) on dialysis ([place])    "TTS; [facility]; they're moving me to another one" (09/22/2018)  . Hepatitis C    diagnosed 09/2018  . Hypertension   . Oxygen deficiency    2 L at night  . Stroke ([place])   . Substance abuse ([place])    has been clean for 4 months     Patient Active Problem List   Diagnosis Date Noted  . History of CVA (cerebrovascular accident) 02/18/2019  . Hypoxia   . Acute metabolic encephalopathy   . Acute on chronic systolic CHF (congestive heart failure) ([place]) 01/11/2019  . Fluid overload 01/11/2019  . Diarrhea 12/28/2018  . Metabolic acidosis,  increased anion gap (IAG) 12/19/2018  . Volvulus ([place]) 12/19/2018  . Hyperkalemia 11/25/2018  . Cocaine overdose, accidental or unintentional, initial encounter ([place]) 11/25/2018  . CAP (community acquired pneumonia) 11/17/2018  . End-stage renal disease on hemodialysis ([place])   . Hypoglycemia 10/23/2018  . Non-intractable vomiting   . Other ascites   . Palliative care encounter   . Left arm pain   . Chronic hepatitis C virus genotype 1b infection ([place]) 09/28/2018  . ICH (intracerebral hemorrhage) ([place]) small left caudate due to HTN 07/17/2018  . HCAP (healthcare-associated pneumonia) 06/21/2018  . Substance induced mood disorder ([place]) 05/15/2018  . Agitation   . Hemodialysis catheter infection, initial encounter ([place])   . 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 Pulmonary embolism ([place]) 04/27/2018  . Sepsis ([place]) 04/26/2018  . Dyspnea 04/26/2018  . Chest pain 04/26/2018  . Tachycardia 04/26/2018  . Abdominal pain 04/18/2018  . Insulin dependent diabetes mellitus ([place]) 04/18/2018  . Right flank pain 04/18/2018  . Acute respiratory failure with hypoxia ([place]) 04/12/2018  . SVT (supraventricular tachycardia) ([place])   . Chronic renal failure   . Hypertension   . Noncompliance   . Acute pulmonary edema (H[facility])   . Elevated troponin 03/19/2018  . Fever   . 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 Diffuse pulmonary alveolar hemorrhage   . Hypoxemia   . Pulmonary infiltrate   . 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[place])   . ESRD on dialysis ([place]) 02/12/2018  . Acute respiratory failure ([place])  02/12/2018  . Alcohol abuse 02/12/2018  . Microcytic anemia 02/12/2018  . Cocaine abuse ([place]) 02/12/2018  . Epigastric pain 02/12/2018  . Pneumonia 02/12/2018  . TOBACCO ABUSE 03/08/2010  . PNEUMONIA 03/08/2010    Past Surgical History:  Procedure Laterality Date  . AV FISTULA PLACEMENT Left 03/05/2018   Procedure: ARTERIOVENOUS (AV) FISTULA CREATION  LEFT UPPER EXTREMITY;  Surgeon: [name],  MD;  Location: [facility];  Service: Vascular;  Laterality: Left;  . AV FISTULA PLACEMENT Left 07/21/2018   Procedure: ARTERIOVENOUS (AV) FISTULA CREATION;  Surgeon: Marty Heck, MD;  Location: [facility];  Service: Vascular;  Laterality: Left;  . AV FISTULA PLACEMENT Left 10/27/2018   Procedure: INSERTION OF ARTERIOVENOUS (AV) GORE-TEX GRAFT UPPER ARM;  Surgeon: Marty Heck, MD;  Location: [facility];  Service: Vascular;  Laterality: Left;  . AV FISTULA PLACEMENT Right 01/19/2019   Procedure: INSERTION OF ARTERIOVENOUS GRAFT RIGHT ARM;  Surgeon: [name], MD;  Location: [facility];  Service: Vascular;  Laterality: Right;  . BASCILIC VEIN TRANSPOSITION Left 09/29/2018   Procedure: BASILIC VEIN TRANSPOSITION SECOND STAGE LEFT UPPER ARM;  Surgeon: Marty Heck, MD;  Location: [facility];  Service: Vascular;  Laterality: Left;  . HEMATOMA EVACUATION Left 03/06/2018   Procedure: EVACUATION HEMATOMA LEFT ARM;  Surgeon: Angelia Mould, MD;  Location: [facility];  Service: Vascular;  Laterality: Left;  . I&D EXTREMITY Left 10/06/2018   Procedure: IRRIGATION AND DEBRIDEMENT ARM;  Surgeon: Marty Heck, MD;  Location: [facility];  Service: Vascular;  Laterality: Left;  . INSERTION OF DIALYSIS CATHETER Left 04/29/2018   Procedure: INSERTION OF DIALYSIS CATHETER LEFT INTERNAL JUGULAR;  Surgeon: Angelia Mould, MD;  Location: [facility];  Service: Vascular;  Laterality: Left;  . INSERTION OF DIALYSIS CATHETER Right 01/19/2019   Procedure: INSERTION OF HEMO DIALYSIS  PALINDROME TUNNELED CATHETER;  Surgeon: [name], MD;  Location: [facility];  Service: Vascular;  Laterality: Right;  . IR FLUORO GUIDE CV LINE RIGHT  02/18/2018  . IR FLUORO GUIDE CV LINE RIGHT  02/26/2018  . IR FLUORO GUIDE CV LINE RIGHT  01/14/2019  . IR REMOVAL TUN CV CATH W/O FL  04/27/2018  . IR THROMBECTOMY AV FISTULA W/THROMBOLYSIS INC/SHUNT/IMG RIGHT  02/20/2019  . IR US GUIDE VASC ACCESS RIGHT  02/18/2018  . IR US GUIDE VASC ACCESS  RIGHT  01/14/2019  . IR US GUIDE VASC ACCESS RIGHT  02/20/2019  . TEE WITHOUT CARDIOVERSION N/A 04/29/2018   Procedure: TRANSESOPHAGEAL ECHOCARDIOGRAM (TEE);  Surgeon: Sueanne Margarita, MD;  Location: [facility] ENDOSCOPY;  Service: Cardiovascular;  Laterality: N/A;  . THROMBECTOMY AND REVISION OF ARTERIOVENTOUS (AV) GORETEX  GRAFT Left 12/11/2018   Procedure: THROMBECTOMY AND  REVISION  ARTERIOVENTOUS GORETEX  GRAFT LEFT ARM;  Surgeon: [name], MD;  Location: [facility];  Service: Vascular;  Laterality: Left;  Marland Kitchen VENOGRAM Left 10/27/2018   Procedure: LEFT CENTRAL VENOGRAM;  Surgeon: Marty Heck, MD;  Location: [facility];  Service: Vascular;  Laterality: Left;     OB History   No obstetric history on file.      Home Medications    Prior to Admission medications   Medication Sig Start Date End Date Taking? Authorizing Provider  amLODipine (NORVASC) 5 MG tablet Take 1 tablet (5 mg total) by mouth at bedtime. 02/07/19   Domenic Polite, MD  aspirin 81 MG EC tablet Take 1 tablet (81 mg total) by mouth daily. 02/07/19   Domenic Polite, MD  B Complex-C-Zn-Folic Acid (DIALYVITE 786-VEHM 15) 0.8 MG TABS Take 1 tablet by mouth daily. 11/04/18   [provider]  Blood Glucose Monitoring Suppl (ACCU-CHEK AVIVA) device Use as instructed 10/29/18 10/29/19  Isabelle Course, MD  calcitRIOL (ROCALTROL) 0.25 MCG capsule Take 1 capsule (0.25 mcg total) by mouth every Monday, Wednesday, and Friday with hemodialysis. Patient taking differently: Take 0.25 mcg by mouth every Tuesday, Thursday, and Saturday at 6 PM.  02/09/19   Domenic Polite, MD  diclofenac sodium (VOLTAREN) 1 % GEL Apply 2 g topically 4 (four) times daily. 02/21/19   Mariel Aloe, MD  fluticasone (FLONASE) 50 MCG/ACT nasal spray Place 2 sprays into both nostrils as needed for allergies.     [provider]  gabapentin (NEURONTIN) 100 MG capsule Take 1 capsule (100 mg total) by mouth 2 (two) times daily for 30 days. Patient taking  differently: Take 100 mg by mouth 3 (three) times daily.  02/07/19 03/09/19  Domenic Polite, MD  glucose blood (ACCU-CHEK AVIVA) test strip Use twice per day. 10/29/18   Isabelle Course, MD  hydrocortisone cream 1 % Apply 1 application topically 2 (two) times daily. Patient taking differently: Apply 1 application topically 2 (two) times daily as needed for itching.  01/20/19   Pokhrel, Corrie Mckusick, MD  insulin aspart (NOVOLOG) 100 UNIT/ML injection Inject 5 Units into the skin 3 (three) times daily with meals. Patient taking differently: Inject 3 Units into the skin 3 (three) times daily with meals.  10/13/18   Alphonzo Grieve, MD  insulin glargine (LANTUS) 100 UNIT/ML injection Inject 0.05 mLs (5 Units total) into the skin at bedtime. 02/07/19   Domenic Polite, MD  Insulin Syringe-Needle U-100 (INSULIN SYRINGE .3CC/31GX5/16") 31G X 5/16" 0.3 ML MISC Inject 1 each into the skin 4 (four) times daily. 06/27/18   Clent Demark, PA-C  Lancets (ACCU-CHEK SOFT [facility]) lancets Use twice per day. 10/29/18   Isabelle Course, MD  oxyCODONE (OXY IR/ROXICODONE) 5 MG immediate release tablet Take 1 tablet (5 mg total) by mouth every 6 (six) hours as needed for moderate pain or severe pain. 02/07/19   Domenic Polite, MD  pantoprazole (PROTONIX) 40 MG tablet Take 1 tablet (40 mg total) by mouth daily. 02/07/19   Domenic Polite, MD  polyethylene glycol (MIRALAX / GLYCOLAX) 17 g packet Take 17 g by mouth daily. Patient taking differently: Take 17 g by mouth daily as needed for mild constipation or moderate constipation.  02/07/19   Domenic Polite, MD  sevelamer carbonate (RENVELA) 800 MG tablet Take 2 tablets (1,600 mg total) by mouth 3 (three) times daily with meals. 02/07/19   Domenic Polite, MD    Family History Family History  Problem Relation Age of Onset  . Autoimmune disease Neg Hx     Social History Social History   Tobacco Use  . Smoking status: Current Every Day Smoker    Packs/day: 0.50    Years: 48.00     Pack years: 24.00    Types: Cigarettes  . Smokeless tobacco: Never Used  . Tobacco comment: trying to quit ; smoking 1 1/2 cigarettes/day  (09/22/2018)  Substance Use Topics  . Alcohol use: Not Currently    Comment: 09/22/2018 "stopped about 6 months ago"  . Drug use: Yes    Types: Cocaine, Heroin    Comment: See notes     Allergies   Acetaminophen and Hydroxyzine   Review of Systems Review of Systems  Constitutional: Negative for chills and fever.  HENT: Negative for congestion, ear discharge, ear pain, sinus pressure, sinus pain and sore throat.   Eyes: Negative for pain and redness.  Respiratory: Negative for cough and shortness of breath.   Cardiovascular: Positive for chest pain.  Gastrointestinal: Positive for abdominal pain. Negative for constipation, diarrhea, nausea and vomiting.  Genitourinary: Negative for dysuria and hematuria.  Musculoskeletal: Negative for back pain and neck pain.  Skin: Negative for wound.  Neurological: Negative for weakness, numbness and headaches.     Physical Exam Updated Vital Signs BP (!) 151/87   Pulse 91   Temp (!) 97.5 F (36.4 C) (Oral)   Resp 18   SpO2 100%   Physical Exam Vitals signs and nursing note reviewed.  Constitutional:      Comments: Pt is uncomfortable appearing.  She is holding her right upper quadrant and writhing around on the stretcher.  HENT:     Head: Normocephalic and atraumatic.     Right Ear: Tympanic membrane and external ear normal.     Left Ear: Tympanic membrane and external ear normal.     Nose: Nose normal.     Mouth/Throat:     Mouth: Mucous membranes are dry.     Pharynx: Oropharynx is clear.  Eyes:     General: No scleral icterus.       Right eye: No discharge.        Left eye: No discharge.     Extraocular Movements: Extraocular movements intact.     Conjunctiva/sclera: Conjunctivae normal.     Pupils: Pupils are equal, round, and reactive to light.  Neck:     Musculoskeletal: Normal  range of motion.     Vascular: JVD present.  Cardiovascular:     Rate and Rhythm: Normal rate and regular rhythm.     Pulses: Normal pulses.          Radial pulses are 2+ on the right side and 2+ on the left side.     Heart sounds: Normal heart sounds.  Pulmonary:     Comments: Lungs clear to auscultation in all fields. Symmetric chest rise. Pt is tachypneic.  No wheezing, rales, or rhonchi. Abdominal:     Tenderness: There is abdominal tenderness in the right upper quadrant and epigastric area. There is guarding. There is no right CVA tenderness, left CVA tenderness or rebound.     Comments: No peritoneal signs.  Musculoskeletal: Normal range of motion.     Right lower leg: Edema present.     Left lower leg: Edema present.  Skin:    General: Skin is warm and dry.     Capillary Refill: Capillary refill takes less than 2 seconds.  Neurological:     Mental Status: She is oriented to person, place, and time.     GCS: GCS eye subscore is 4. GCS verbal subscore is 5. GCS motor subscore is 6.     Comments: Fluent speech, no facial droop.  Psychiatric:        Behavior: Behavior normal.      ED Treatments / Results  Labs (all labs ordered are listed, but only abnormal results are displayed) Labs Reviewed  COMPREHENSIVE METABOLIC PANEL - Abnormal; Notable for the following components:      Result Value   BUN 41 (*)    Creatinine, Ser 7.67 (*)    Albumin 2.1 (*)    AST 43 (*)    GFR calc non Af Amer 5 (*)    GFR calc Af Wyvonnia Lora  6 (*)    All other components within normal limits  CBC WITH DIFFERENTIAL/PLATELET - Abnormal; Notable for the following components:   WBC 12.5 (*)    RBC 3.53 (*)    Hemoglobin 7.7 (*)    HCT 25.6 (*)    MCV 72.5 (*)    MCH 21.8 (*)    RDW 23.6 (*)    Neutro Abs 9.2 (*)    Monocytes Absolute 1.1 (*)    Abs Immature Granulocytes 0.09 (*)    All other components within normal limits  TROPONIN I (HIGH SENSITIVITY) - Abnormal; Notable for the following  components:   Troponin I (High Sensitivity) 33 (*)    All other components within normal limits  TROPONIN I (HIGH SENSITIVITY) - Abnormal; Notable for the following components:   Troponin I (High Sensitivity) 30 (*)    All other components within normal limits  POCT I-STAT EG7 - Abnormal; Notable for the following components:   pH, Ven 7.461 (*)    pCO2, Ven 37.7 (*)    Acid-Base Excess 3.0 (*)    HCT 28.0 (*)    Hemoglobin 9.5 (*)    All other components within normal limits  SARS CORONAVIRUS 2 (HOSPITAL ORDER, [facility])  LIPASE, BLOOD    EKG EKG Interpretation  Date/Time:  Wednesday March 04 2019 09:39:42 EDT Ventricular Rate:  89 PR Interval:    QRS Duration: 75 QT Interval:  369 QTC Calculation: 449 R Axis:   -15 Text Interpretation:  Sinus rhythm Ventricular premature complex Consider left ventricular hypertrophy No significant change since last tracing Confirmed by Deno Etienne [phone]) on 03/05/2019 9:44:38 AM   Radiology Ct Abdomen Pelvis W Contrast  Result Date: 02/21/2019 CLINICAL DATA:  Abdominal pain anteriorly EXAM: CT ABDOMEN AND PELVIS WITH CONTRAST TECHNIQUE: Multidetector CT imaging of the abdomen and pelvis was performed using the standard protocol following bolus administration of intravenous contrast. CONTRAST:  26mL ISOVUE-300 IOPAMIDOL (ISOVUE-300) INJECTION 61% COMPARISON:  02/05/2019, 12/03/2018, 04/20/2018 FINDINGS: Lower chest: Bilateral interstitial and alveolar airspace disease in the right middle lobe, right lower lobe, lingula and left lower lobe. Bilateral small pleural effusions. Differential considerations include unresolved pneumonia including atypical pneumonia versus an element of pulmonary edema. New small pericardial effusion. Hepatobiliary: No focal liver abnormality is seen. Status post cholecystectomy. No biliary dilatation. Pancreas: Unremarkable. No pancreatic ductal dilatation or surrounding inflammatory changes.  Spleen: Normal spleen size. 17 mm hypodense splenic mass unchanged compared with multiple prior examinations. Adrenals/Urinary Tract: Adrenal glands are unremarkable. Kidneys are normal, without renal calculi, focal lesion, or hydronephrosis. Bladder is unremarkable. Bilateral nonspecific perinephric stranding. Stomach/Bowel: Stomach is within normal limits. Appendix appears normal. No evidence of bowel wall thickening, distention, or inflammatory changes. Vascular/Lymphatic: Normal caliber abdominal aorta with mild atherosclerosis. No lymphadenopathy. Reproductive: Prior hysterectomy. 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 Other: No fluid collection or hematoma. Small amount of pelvic free fluid. Small amount of pelvic free fluid. Interval worsening anasarca. Musculoskeletal: No acute osseous abnormality. No aggressive osseous lesion. IMPRESSION: 1. No acute abdominal or pelvic pathology. 2. Interval worsening of anasarca.  Small volume pelvic free fluid. 3. Bilateral interstitial and alveolar airspace disease in the right middle lobe, right lower lobe, lingula and left lower lobe. Bilateral small pleural effusions. Differential considerations include unresolved pneumonia including atypical pneumonia versus an element of pulmonary edema. 4. New small pericardial effusion. Electronically Signed   By: Kathreen Devoid   On: 02/11/2019  12:46   Dg Chest Portable 1 View  Result Date: 03/01/2019 CLINICAL DATA:  Chest pain EXAM: PORTABLE CHEST 1 VIEW COMPARISON:  02/18/2019 FINDINGS: Cardiomegaly and vascular pedicle widening. Diffuse interstitial coarsening. Dialysis catheter on the right with tip at the right atrium. Probable small pleural effusions. IMPRESSION: Cardiomegaly with pulmonary edema. Electronically Signed   By: Monte Fantasia M.D.   On: 02/14/2019 10:53    Procedures Procedures (including critical care time)  Medications Ordered in ED Medications   fentaNYL (SUBLIMAZE) injection 50 mcg (50 mcg Intravenous Given 03/01/2019 1052)  iopamidol (ISOVUE-300) 61 % injection 60 mL (60 mLs Intravenous Contrast Given 02/20/2019 1221)  fentaNYL (SUBLIMAZE) injection 50 mcg (50 mcg Intravenous Given 02/11/2019 1257)     Initial Impression / Assessment and Plan / ED Course  I have reviewed the triage vital signs and the nursing notes.  Pertinent labs & imaging results that were available during my care of the patient were reviewed by me and considered in my medical decision making (see chart for details).  On arrival patient is uncomfortable appearing, writhing around in pain on the stretcher.  She is afebrile and tachypneic. On arrival she was hypoxic to 80% on room air. On non rebreather she continued to sat in On exam she has tenderness to epigastric area.  Also has bilateral lower extremity edema. She did not complete full dialysis session yesterday, only had 2 hours. CBC with leukocytosis of 12.5.  CMP with elevated BUN/creatinine consistent with patient's ESRD.  Potassium is 5.0.  Today creatinine is 7.67, compared to 12 days ago at 6.38.  Chest x-ray shows cardiomegaly with pulmonary edema. Will consult nephrology for dialysis given volume overload. Pain controlled with IV fentanyl. On repeat assessment abdominal pain resolved, doubt surgical abdomen. CT abdomen pelvis shows worsening and scarring with small volume of free fluid in the pelvis.  Also concern for unresolved pneumonia versus pulmonary edema.  Covid test is negative. This case was discussed with Dr. Tyrone Nine who has seen the patient and agrees with plan to admit. Will hold off on antibiotics for pneumonia as presentation is consistent with fluid overload. Will admit for acute hypoxic resp failure secondary pulmonary edema with possible PNA. Will need urgent HD, and close monitoring.  Consulted nephrologist Dr. Jonnie Finner who will evaluate the patient as she likely needs emergent dialysis. Spoke with  Dr. Lorin Mercy with hospitalist service who agrees to assume care of patient and bring into the hospital for further evaluation and management.     This note was prepared using Dragon voice recognition software and may include unintentional dictation errors due to the inherent limitations of voice recognition software.    Final Clinical Impressions(s) / ED Diagnoses   Final diagnoses:  Hypervolemia, unspecified hypervolemia type    ED Discharge Orders    None       Cherre Robins, PA-C 02/17/2019 Amberg, Kirkman, DO 03/05/19 [phone]

## 2019-03-05 ENCOUNTER — Inpatient Hospital Stay (HOSPITAL_COMMUNITY): Payer: Medicare Other

## 2019-03-05 ENCOUNTER — Observation Stay (HOSPITAL_COMMUNITY): Payer: Medicare Other

## 2019-03-05 DIAGNOSIS — J9601 Acute respiratory failure with hypoxia: Secondary | ICD-10-CM | POA: Diagnosis not present

## 2019-03-05 DIAGNOSIS — F1410 Cocaine abuse, uncomplicated: Secondary | ICD-10-CM | POA: Diagnosis present

## 2019-03-05 DIAGNOSIS — I4892 Unspecified atrial flutter: Secondary | ICD-10-CM | POA: Diagnosis not present

## 2019-03-05 DIAGNOSIS — G9340 Encephalopathy, unspecified: Secondary | ICD-10-CM | POA: Diagnosis not present

## 2019-03-05 DIAGNOSIS — R0602 Shortness of breath: Secondary | ICD-10-CM | POA: Diagnosis present

## 2019-03-05 DIAGNOSIS — Z515 Encounter for palliative care: Secondary | ICD-10-CM | POA: Diagnosis not present

## 2019-03-05 DIAGNOSIS — I313 Pericardial effusion (noninflammatory): Secondary | ICD-10-CM | POA: Diagnosis not present

## 2019-03-05 DIAGNOSIS — I6389 Other cerebral infarction: Secondary | ICD-10-CM | POA: Diagnosis not present

## 2019-03-05 DIAGNOSIS — N186 End stage renal disease: Secondary | ICD-10-CM | POA: Diagnosis not present

## 2019-03-05 DIAGNOSIS — E8770 Fluid overload, unspecified: Secondary | ICD-10-CM | POA: Diagnosis not present

## 2019-03-05 DIAGNOSIS — J9621 Acute and chronic respiratory failure with hypoxia: Secondary | ICD-10-CM | POA: Diagnosis not present

## 2019-03-05 DIAGNOSIS — I517 Cardiomegaly: Secondary | ICD-10-CM | POA: Diagnosis not present

## 2019-03-05 DIAGNOSIS — Z781 Physical restraint status: Secondary | ICD-10-CM | POA: Diagnosis not present

## 2019-03-05 DIAGNOSIS — E8779 Other fluid overload: Secondary | ICD-10-CM | POA: Diagnosis not present

## 2019-03-05 DIAGNOSIS — I132 Hypertensive heart and chronic kidney disease with heart failure and with stage 5 chronic kidney disease, or end stage renal disease: Secondary | ICD-10-CM | POA: Diagnosis not present

## 2019-03-05 DIAGNOSIS — J189 Pneumonia, unspecified organism: Secondary | ICD-10-CM | POA: Diagnosis not present

## 2019-03-05 DIAGNOSIS — F17210 Nicotine dependence, cigarettes, uncomplicated: Secondary | ICD-10-CM | POA: Diagnosis present

## 2019-03-05 DIAGNOSIS — T405X1A Poisoning by cocaine, accidental (unintentional), initial encounter: Secondary | ICD-10-CM | POA: Diagnosis not present

## 2019-03-05 DIAGNOSIS — I4891 Unspecified atrial fibrillation: Secondary | ICD-10-CM | POA: Diagnosis not present

## 2019-03-05 DIAGNOSIS — F1490 Cocaine use, unspecified, uncomplicated: Secondary | ICD-10-CM | POA: Diagnosis not present

## 2019-03-05 DIAGNOSIS — Z20828 Contact with and (suspected) exposure to other viral communicable diseases: Secondary | ICD-10-CM | POA: Diagnosis not present

## 2019-03-05 DIAGNOSIS — I5043 Acute on chronic combined systolic (congestive) and diastolic (congestive) heart failure: Secondary | ICD-10-CM | POA: Diagnosis not present

## 2019-03-05 DIAGNOSIS — J810 Acute pulmonary edema: Secondary | ICD-10-CM | POA: Diagnosis not present

## 2019-03-05 DIAGNOSIS — I10 Essential (primary) hypertension: Secondary | ICD-10-CM | POA: Diagnosis not present

## 2019-03-05 DIAGNOSIS — J984 Other disorders of lung: Secondary | ICD-10-CM | POA: Diagnosis not present

## 2019-03-05 DIAGNOSIS — Z8673 Personal history of transient ischemic attack (TIA), and cerebral infarction without residual deficits: Secondary | ICD-10-CM | POA: Diagnosis not present

## 2019-03-05 DIAGNOSIS — Z66 Do not resuscitate: Secondary | ICD-10-CM | POA: Diagnosis not present

## 2019-03-05 DIAGNOSIS — I6340 Cerebral infarction due to embolism of unspecified cerebral artery: Secondary | ICD-10-CM | POA: Diagnosis not present

## 2019-03-05 DIAGNOSIS — Z9115 Patient's noncompliance with renal dialysis: Secondary | ICD-10-CM | POA: Diagnosis not present

## 2019-03-05 DIAGNOSIS — Z992 Dependence on renal dialysis: Secondary | ICD-10-CM | POA: Diagnosis not present

## 2019-03-05 DIAGNOSIS — I471 Supraventricular tachycardia: Secondary | ICD-10-CM | POA: Diagnosis not present

## 2019-03-05 DIAGNOSIS — J8489 Other specified interstitial pulmonary diseases: Secondary | ICD-10-CM | POA: Diagnosis present

## 2019-03-05 DIAGNOSIS — Z9981 Dependence on supplemental oxygen: Secondary | ICD-10-CM | POA: Diagnosis not present

## 2019-03-05 DIAGNOSIS — I639 Cerebral infarction, unspecified: Secondary | ICD-10-CM | POA: Diagnosis not present

## 2019-03-05 DIAGNOSIS — R4182 Altered mental status, unspecified: Secondary | ICD-10-CM | POA: Diagnosis not present

## 2019-03-05 DIAGNOSIS — B1920 Unspecified viral hepatitis C without hepatic coma: Secondary | ICD-10-CM | POA: Diagnosis present

## 2019-03-05 DIAGNOSIS — Z794 Long term (current) use of insulin: Secondary | ICD-10-CM | POA: Diagnosis not present

## 2019-03-05 DIAGNOSIS — G9349 Other encephalopathy: Secondary | ICD-10-CM | POA: Diagnosis not present

## 2019-03-05 DIAGNOSIS — J9620 Acute and chronic respiratory failure, unspecified whether with hypoxia or hypercapnia: Secondary | ICD-10-CM | POA: Diagnosis not present

## 2019-03-05 DIAGNOSIS — R569 Unspecified convulsions: Secondary | ICD-10-CM | POA: Diagnosis not present

## 2019-03-05 DIAGNOSIS — I361 Nonrheumatic tricuspid (valve) insufficiency: Secondary | ICD-10-CM | POA: Diagnosis not present

## 2019-03-05 DIAGNOSIS — E1122 Type 2 diabetes mellitus with diabetic chronic kidney disease: Secondary | ICD-10-CM | POA: Diagnosis not present

## 2019-03-05 LAB — HEMOGLOBIN AND HEMATOCRIT, BLOOD
HCT: 25 % — ABNORMAL LOW (ref 36.0–46.0)
Hemoglobin: 7.6 g/dL — ABNORMAL LOW (ref 12.0–15.0)

## 2019-03-05 LAB — BASIC METABOLIC PANEL
Anion gap: 12 (ref 5–15)
BUN: 24 mg/dL — ABNORMAL HIGH (ref 8–23)
CO2: 25 mmol/L (ref 22–32)
Calcium: 8.6 mg/dL — ABNORMAL LOW (ref 8.9–10.3)
Chloride: 99 mmol/L (ref 98–111)
Creatinine, Ser: 5.59 mg/dL — ABNORMAL HIGH (ref 0.44–1.00)
GFR calc Af Amer: 9 mL/min — ABNORMAL LOW (ref >60)
GFR calc non Af Amer: 7 mL/min — ABNORMAL LOW (ref >60)
Glucose, Bld: 99 mg/dL (ref 70–99)
Potassium: 4.5 mmol/L (ref 3.5–5.1)
Sodium: 136 mmol/L (ref 135–145)

## 2019-03-05 LAB — CBC
HCT: 23.1 % — ABNORMAL LOW (ref 36.0–46.0)
Hemoglobin: 7.2 g/dL — ABNORMAL LOW (ref 12.0–15.0)
MCH: 22.2 pg — ABNORMAL LOW (ref 26.0–34.0)
MCHC: 31.2 g/dL (ref 30.0–36.0)
MCV: 71.3 fL — ABNORMAL LOW (ref 80.0–100.0)
Platelets: 272 10*3/uL (ref 150–400)
RBC: 3.24 MIL/uL — ABNORMAL LOW (ref 3.87–5.11)
RDW: 23 % — ABNORMAL HIGH (ref 11.5–15.5)
WBC: 14.2 10*3/uL — ABNORMAL HIGH (ref 4.0–10.5)
nRBC: 0.2 % (ref 0.0–0.2)

## 2019-03-05 LAB — GLUCOSE, CAPILLARY
Glucose-Capillary: 133 mg/dL — ABNORMAL HIGH (ref 70–99)
Glucose-Capillary: 146 mg/dL — ABNORMAL HIGH (ref 70–99)
Glucose-Capillary: 177 mg/dL — ABNORMAL HIGH (ref 70–99)
Glucose-Capillary: 88 mg/dL (ref 70–99)

## 2019-03-05 MED ORDER — PIPERACILLIN-TAZOBACTAM 3.375 G IVPB 30 MIN
3.3750 g | Freq: Once | INTRAVENOUS | Status: AC
  Administered 2019-03-05: 3.375 g via INTRAVENOUS
  Filled 2019-03-05: qty 50

## 2019-03-05 MED ORDER — OXYCODONE HCL 5 MG PO TABS
ORAL_TABLET | ORAL | Status: AC
  Administered 2019-03-05: 5 mg via ORAL
  Filled 2019-03-05: qty 1

## 2019-03-05 MED ORDER — CHLORHEXIDINE GLUCONATE CLOTH 2 % EX PADS: 6.0000 | MEDICATED_PAD | Freq: Every day | CUTANEOUS | Status: DC

## 2019-03-05 MED ORDER — SENNOSIDES-DOCUSATE SODIUM 8.6-50 MG PO TABS: 1.0000 | ORAL_TABLET | Freq: Every evening | ORAL | Status: DC | PRN

## 2019-03-05 MED ORDER — PIPERACILLIN-TAZOBACTAM 3.375 G IVPB
3.3750 g | Freq: Two times a day (BID) | INTRAVENOUS | Status: AC
  Administered 2019-03-06 – 2019-03-10 (×10): 3.375 g via INTRAVENOUS
  Filled 2019-03-05 (×14): qty 50

## 2019-03-05 MED ORDER — LORAZEPAM 2 MG/ML IJ SOLN
1.0000 mg | Freq: Once | INTRAMUSCULAR | Status: AC
  Administered 2019-03-05: 22:00:00 1 mg via INTRAVENOUS
  Filled 2019-03-05: qty 1

## 2019-03-05 MED ORDER — HEPARIN SODIUM (PORCINE) 1000 UNIT/ML DIALYSIS
3000.0000 [IU] | Freq: Once | INTRAMUSCULAR | Status: DC
  Filled 2019-03-05: qty 3

## 2019-03-05 NOTE — Progress Notes (Signed)
PROGRESS NOTE    Rhonda Lynch  NTZ:[id number] DOB: Jul 20, 1956 DOA: 02/18/2019 PCP: Clent Demark, PA-C   Brief Narrative: Rhonda Lynch is a 63 y.o. female with medical history significant of polysubstance abuse; CVA; nocturnal O2 requirement; HTN; DM; ESRD on TTS HD; and hepatitis C. Patient presented secondary to shortness of breath secondary to missed HD.   Assessment & Plan:   Principal Problem:   Volume overload Active Problems:   TOBACCO ABUSE   Acute respiratory failure (HCC)   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[place])   Acute pulmonary edema (HCC)   Hypertension   Insulin dependent diabetes mellitus ([place])   End-stage renal disease on hemodialysis (HCC)   Volume overload Secondary to missed HD -Nephrology recommendations for HD  Airspace disease Appears to be likely pneumonia; possible aspiration with significantly worse right side. Elevated WBC and near fever overnight. Recent MRSA pcr negative. -CT chest to help differentiate between infection and fluid -Blood cultures, will not get procalcitonin in setting of ESRD -Zosyn after blood cultures obtained  Acute respiratory failure with hypoxia Secondary to above problems. -Continue oxygen and wean to room air as able  Essential hypertension -Continue Norvasc  Diabetes mellitus, type 2 Patient is on insulin as an outpatient. Long acting nsulin currently held -Continue SSI  Cocaine abuse Cessation discussed on admission  Tobacco use -Continue nicotine patch   DVT prophylaxis: Heparin subq Code Status:   Code Status: Full Code Family Communication: None Disposition Plan: Discharge pending workup for lung disease, improvement of volume status and wean to room air   Consultants:   Nephrology  Procedures:   HD  Antimicrobials:  Zosyn (6/25>>    Subjective: Still with dyspnea but it is improved from yesterday. She has some epigastric pain.  Objective: Vitals:    02/28/2019 2135 03/02/2019 2253 03/05/19 0007 03/05/19 0441  BP:  (!) 174/95 (!) 150/84 (!) 133/58  Pulse:  (!) 115  100  Resp:  (!) 28  (!) 22  Temp:  100.1 F (37.8 C)  99.3 F (37.4 C)  TempSrc:  Axillary  Axillary  SpO2: 100% 100%  98%    Intake/Output Summary (Last 24 hours) at 03/05/2019 1242 Last data filed at 02/16/2019 2116 Gross per 24 hour  Intake -  Output 3100 ml  Net -3100 ml   There were no vitals filed for this visit.  Examination:  General exam: Appears calm and comfortable Respiratory system: Rales, R>L. Respiratory effort normal. Cardiovascular system: S1 & S2 heard, RRR. Gastrointestinal system: Abdomen is slightly distended, soft and nontender. No organomegaly or masses felt. Normal bowel sounds heard. Central nervous system: Alert and oriented. No focal neurological deficits. Extremities: Edema. No calf tenderness Skin: No cyanosis. No rashes Psychiatry: Judgement and insight appear normal. Mood & affect appropriate.     Data Reviewed: I have personally reviewed following labs and imaging studies  CBC: Recent Labs  Lab 02/21/2019 1049 03/09/2019 1057 03/05/19 0539  WBC 12.5*  --  14.2*  NEUTROABS 9.2*  --   --   HGB 7.7* 9.5* 7.2*  HCT 25.6* 28.0* 23.1*  MCV 72.5*  --  71.3*  PLT 288  --  675   Basic Metabolic Panel: Recent Labs  Lab 02/27/2019 1049 02/27/2019 1057 03/05/19 0539  NA 136 135 136  K 5.0 5.1 4.5  CL 100  --  99  CO2 25  --  25  GLUCOSE 91  --  99  BUN 41*  --  24*  CREATININE 7.67*  --  5.59*  CALCIUM 9.1  --  8.6*   GFR: Estimated Creatinine Clearance: 7.6 mL/min (A) (by C-G formula based on SCr of 5.59 mg/dL (H)). Liver Function Tests: Recent Labs  Lab 02/23/2019 1049  AST 43*  ALT 23  ALKPHOS 110  BILITOT 0.8  PROT 7.8  ALBUMIN 2.1*   Recent Labs  Lab 03/09/2019 1049  LIPASE 46   No results for input(s): AMMONIA in the last 168 hours. Coagulation Profile: No results for input(s): INR, PROTIME in the last 168  hours. Cardiac Enzymes: No results for input(s): CKTOTAL, CKMB, CKMBINDEX, TROPONINI in the last 168 hours. BNP (last 3 results) No results for input(s): PROBNP in the last 8760 hours. HbA1C: No results for input(s): HGBA1C in the last 72 hours. CBG: Recent Labs  Lab 03/05/19 0020 03/05/19 0748  GLUCAP 88 146*   Lipid Profile: No results for input(s): CHOL, HDL, LDLCALC, TRIG, CHOLHDL, LDLDIRECT in the last 72 hours. Thyroid Function Tests: No results for input(s): TSH, T4TOTAL, FREET4, T3FREE, THYROIDAB in the last 72 hours. Anemia Panel: No results for input(s): VITAMINB12, FOLATE, FERRITIN, TIBC, IRON, RETICCTPCT in the last 72 hours. Sepsis Labs: No results for input(s): PROCALCITON, LATICACIDVEN in the last 168 hours.  Recent Results (from the past 240 hour(s))  SARS Coronavirus 2 (CEPHEID - Performed in [place] hospital lab), Hosp Order     Status: None   Collection Time: 03/09/2019 12:56 PM   Specimen: Nasopharyngeal Swab  Result Value Ref Range Status   SARS Coronavirus 2 NEGATIVE NEGATIVE Final    Comment: (NOTE) If result is NEGATIVE SARS-CoV-2 target nucleic acids are NOT DETECTED. The SARS-CoV-2 RNA is generally detectable in upper and lower  respiratory specimens during the acute phase of infection. The lowest  concentration of SARS-CoV-2 viral copies this assay can detect is 250  copies / mL. A negative result does not preclude SARS-CoV-2 infection  and should not be used as the sole basis for treatment or other  patient management decisions.  A negative result may occur with  improper specimen collection / handling, submission of specimen other  than nasopharyngeal swab, presence of viral mutation(s) within the  areas targeted by this assay, and inadequate number of viral copies  (<250 copies / mL). A negative result must be combined with clinical  observations, patient history, and epidemiological information. If result is POSITIVE SARS-CoV-2 target  nucleic acids are DETECTED. The SARS-CoV-2 RNA is generally detectable in upper and lower  respiratory specimens dur ing the acute phase of infection.  Positive  results are indicative of active infection with SARS-CoV-2.  Clinical  correlation with patient history and other diagnostic information is  necessary to determine patient infection status.  Positive results do  not rule out bacterial infection or co-infection with other viruses. If result is PRESUMPTIVE POSTIVE SARS-CoV-2 nucleic acids MAY BE PRESENT.   A presumptive positive result was obtained on the submitted specimen  and confirmed on repeat testing.  While 2019 novel coronavirus  (SARS-CoV-2) nucleic acids may be present in the submitted sample  additional confirmatory testing may be necessary for epidemiological  and / or clinical management purposes  to differentiate between  SARS-CoV-2 and other Sarbecovirus currently known to infect humans.  If clinically indicated additional testing with an alternate test  methodology [phone]) is advised. The SARS-CoV-2 RNA is generally  detectable in upper and lower respiratory sp ecimens during the acute  phase of infection. The expected result is Negative. Fact  Sheet for Patients:  StrictlyIdeas.no Fact Sheet for Healthcare Providers: BankingDealers.co.za This test is not yet approved or cleared by the [place] FDA and has been authorized for detection and/or diagnosis of SARS-CoV-2 by FDA under an Emergency Use Authorization (EUA).  This EUA will remain in effect (meaning this test can be used) for the duration of the COVID-19 declaration under Section 564(b)(1) of the Act, 21 U.S.C. section 360bbb-3(b)(1), unless the authorization is terminated or revoked sooner. Performed at [facility], [place] [address], [place], [place] [address]          Radiology Studies: Dg Chest 2 View  Result Date: 03/05/2019 CLINICAL  DATA:  Shortness of breath.  End-stage renal disease EXAM: CHEST - 2 VIEW COMPARISON:  March 04, 2019 FINDINGS: There is extensive airspace consolidation throughout the right lung, increased compared to 1 day prior. There is more patchy infiltrate in the left upper and lower lobes, similar to 1 day prior. There is a small left pleural effusion. There is cardiomegaly with pulmonary vascularity within normal limits. Central catheter tip is in the right atrium. No pneumothorax. There is aortic atherosclerosis. No evident adenopathy. No bone lesions. IMPRESSION: Widespread airspace consolidation which has increased throughout the right lung compared to 1 day prior. Stable airspace opacity on the left. Suspect multifocal pneumonia, potentially superimposed on a degree of pulmonary edema. There is stable cardiomegaly. There is a small left pleural effusion. Central catheter tip in right atrium. No pneumothorax. Aortic Atherosclerosis (ICD10-I70.0). Electronically Signed   By: Lowella Grip III M.D.   On: 03/05/2019 10:33   Ct Abdomen Pelvis W Contrast  Result Date: 02/10/2019 CLINICAL DATA:  Abdominal pain anteriorly EXAM: CT ABDOMEN AND PELVIS WITH CONTRAST TECHNIQUE: Multidetector CT imaging of the abdomen and pelvis was performed using the standard protocol following bolus administration of intravenous contrast. CONTRAST:  68mL ISOVUE-300 IOPAMIDOL (ISOVUE-300) INJECTION 61% COMPARISON:  02/05/2019, 12/03/2018, 04/20/2018 FINDINGS: Lower chest: Bilateral interstitial and alveolar airspace disease in the right middle lobe, right lower lobe, lingula and left lower lobe. Bilateral small pleural effusions. Differential considerations include unresolved pneumonia including atypical pneumonia versus an element of pulmonary edema. New small pericardial effusion. Hepatobiliary: No focal liver abnormality is seen. Status post cholecystectomy. No biliary dilatation. Pancreas: Unremarkable. No pancreatic ductal dilatation  or surrounding inflammatory changes. Spleen: Normal spleen size. 17 mm hypodense splenic mass unchanged compared with multiple prior examinations. Adrenals/Urinary Tract: Adrenal glands are unremarkable. Kidneys are normal, without renal calculi, focal lesion, or hydronephrosis. Bladder is unremarkable. Bilateral nonspecific perinephric stranding. Stomach/Bowel: Stomach is within normal limits. Appendix appears normal. No evidence of bowel wall thickening, distention, or inflammatory changes. Vascular/Lymphatic: Normal caliber abdominal aorta with mild atherosclerosis. No lymphadenopathy. Reproductive: Prior hysterectomy. 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 Other: No fluid collection or hematoma. Small amount of pelvic free fluid. Small amount of pelvic free fluid. Interval worsening anasarca. Musculoskeletal: No acute osseous abnormality. No aggressive osseous lesion. IMPRESSION: 1. No acute abdominal or pelvic pathology. 2. Interval worsening of anasarca.  Small volume pelvic free fluid. 3. Bilateral interstitial and alveolar airspace disease in the right middle lobe, right lower lobe, lingula and left lower lobe. Bilateral small pleural effusions. Differential considerations include unresolved pneumonia including atypical pneumonia versus an element of pulmonary edema. 4. New small pericardial effusion. Electronically Signed   By: Kathreen Devoid   On: 03/03/2019 12:46   Dg Chest Portable 1 View  Result Date: 03/07/2019 CLINICAL DATA:  Chest pain EXAM: PORTABLE CHEST 1 VIEW COMPARISON:  02/18/2019 FINDINGS: Cardiomegaly and vascular pedicle widening. Diffuse interstitial coarsening. Dialysis catheter on the right with tip at the right atrium. Probable small pleural effusions. IMPRESSION: Cardiomegaly with pulmonary edema. Electronically Signed   By: Monte Fantasia M.D.   On: 03/07/2019 10:53        Scheduled Meds: . amLODipine  5 mg Oral QHS  . aspirin  EC  81 mg Oral Daily  . calcitRIOL  0.25 mcg Oral Q T,Th,Sat-1800  . Chlorhexidine Gluconate Cloth  6 each Topical Q0600  . Chlorhexidine Gluconate Cloth  6 each Topical Q0600  . gabapentin  100 mg Oral TID  . heparin  5,000 Units Subcutaneous Q8H  . insulin aspart  0-5 Units Subcutaneous QHS  . insulin aspart  0-9 Units Subcutaneous TID WC  . metoprolol tartrate  5 mg Intravenous Once  . nicotine  14 mg Transdermal Daily  . pantoprazole  40 mg Oral Daily  . sevelamer carbonate  1,600 mg Oral TID WC  . sodium chloride flush  3 mL Intravenous Q12H   Continuous Infusions:   LOS: 0 days     Cordelia Poche, MD [facility] 03/05/2019, 12:42 PM  If 7PM-7AM, please contact night-coverage [email or web address]

## 2019-03-05 NOTE — Progress Notes (Addendum)
Rhonda Lynch Progress Note   Subjective:  Seen in room. Had urgent HD last night. 3L removed. Worsening CXR. Mild fever last night. Some SOB, cough today   Objective Vitals:   02/22/2019 2135 03/08/2019 2253 03/05/19 0007 03/05/19 0441  BP:  (!) 174/95 (!) 150/84 (!) 133/58  Pulse:  (!) 115  100  Resp:  (!) 28  (!) 22  Temp:  100.1 F (37.8 C)  99.3 F (37.4 C)  TempSrc:  Axillary  Axillary  SpO2: 100% 100%  98%    Physical Exam General: Fatigued, sitting on side of bed, nad  Heart: RRR Lungs: crackles at bases, bilat Abdomen: soft, NTND Extremities: Trace LE edema  Dialysis Access: R IJ TDC/ R AVG    Weight change:    Additional Objective Labs: Basic Metabolic Panel: Recent Labs  Lab 03/03/2019 1049 03/10/2019 1057 03/05/19 0539  NA 136 135 136  K 5.0 5.1 4.5  CL 100  --  99  CO2 25  --  25  GLUCOSE 91  --  99  BUN 41*  --  24*  CREATININE 7.67*  --  5.59*  CALCIUM 9.1  --  8.6*   CBC: Recent Labs  Lab 02/11/2019 1049 03/07/2019 1057 03/05/19 0539  WBC 12.5*  --  14.2*  NEUTROABS 9.2*  --   --   HGB 7.7* 9.5* 7.2*  HCT 25.6* 28.0* 23.1*  MCV 72.5*  --  71.3*  PLT 288  --  272   Blood Culture    Component Value Date/Time   SDES BLOOD SITE NOT SPECIFIED 02/18/2019 1218   SPECREQUEST  02/18/2019 1218    BOTTLES DRAWN AEROBIC ONLY Blood Culture results may not be optimal due to an inadequate volume of blood received in culture bottles   CULT  02/18/2019 1218    NO GROWTH 6 DAYS Performed at [facility], [place] [address] [place], [place] [address]    REPTSTATUS 02/24/2019 FINAL 02/18/2019 1218     Medications:  . amLODipine  5 mg Oral QHS  . aspirin EC  81 mg Oral Daily  . calcitRIOL  0.25 mcg Oral Q T,Th,Sat-1800  . Chlorhexidine Gluconate Cloth  6 each Topical Q0600  . Chlorhexidine Gluconate Cloth  6 each Topical Q0600  . gabapentin  100 mg Oral TID  . heparin  5,000 Units Subcutaneous Q8H  . insulin aspart  0-5 Units  Subcutaneous QHS  . insulin aspart  0-9 Units Subcutaneous TID WC  . metoprolol tartrate  5 mg Intravenous Once  . nicotine  14 mg Transdermal Daily  . pantoprazole  40 mg Oral Daily  . sevelamer carbonate  1,600 mg Oral TID WC  . sodium chloride flush  3 mL Intravenous Q12H    Dialysis Orders:  TTS [facility]   4h  53.5kg  2/2.5 bath  (R AVG) / TDC   Hep 3000  - mircera 225 q 2wks (last 6/18)  - venofer 50/wk  - hect 4ug tiw  Assessment/Plan: 1. SOB/ pulm edema/ acute on chron resp failure: has chronic ground glass opacities/ hx BOOP6/19 treated w/ steroids.  Suspect vol overload w/ missed HD although CXR at baseline is significantly abnormal and not sure she doesn't have chronic lung disease. Had urgent HD last night >RepeatCXR this am with increased consolidation in right lung. CT chest ordered per primary 2. Abd pain/ chest pain: trop negative 3. ESRD- TTS. HD yesterday and again today. AVGG(placed 5/11)hadfirst use here on 6/11. On 6/12  IR did declot.  AVG is open and pt states they are using it, still has TDC.  AVG worked well last night. Can prob remove TDC while here.  4. HTN/ volume - BP's high in ED, get vol down w/ HD 5. Anemia ckd - Hgb 7.2. s/p IV Fe course as outpatient. Next esa due 7/2 6. Nutrition- renal carb mod/vit 7. DM 8. Hx CVA 9. Hep C +  Lynnda Child PA-C [facility] Pager [phone] 03/05/2019,12:27 PM  LOS: 0 days   Pt seen, examined and agree w A/P as above.  Kelly Splinter  MD 03/05/2019, 4:09 PM

## 2019-03-05 NOTE — Progress Notes (Signed)
Blood cultures obtained, paged [facility] for iv anitobiotic orders. Patient running 101.4 fever, awaiting for orders. Will continue to monitor closely.

## 2019-03-05 NOTE — Progress Notes (Signed)
Pt not in need of BIPAP at this time. RT will continue to monitor.

## 2019-03-05 NOTE — Progress Notes (Signed)
Patient refusing getting her blood sugar taken. Patient educated, will continue to monitor patient.

## 2019-03-05 NOTE — Progress Notes (Signed)
Spoke to [employer], NP got orders to place ice packs on patient to help with fever. Will not order acetaminophen because of allergy and creatine being to high. Will continue to monitor patient closely.

## 2019-03-05 NOTE — Progress Notes (Signed)
PT noted with a Hgb of 7.2.  Texted on call through [facility] for [facility] and made aware.

## 2019-03-05 NOTE — Progress Notes (Signed)
Patient refusing to put on Nasal cannula. Patient educated about the importance of oxygen therapy. Patient still refusing; pushing nasal cannula away from nurse. Will continue to monitor patient closely.

## 2019-03-05 NOTE — Progress Notes (Signed)
Renal Navigator notified [facility] HD clinic[facility] of patient's negative COVID 19 rapid test result in order to provide continuity of care and safety.  Alphonzo Cruise, [place] Renal Navigator [phone]

## 2019-03-05 NOTE — Progress Notes (Signed)
Pharmacy Antibiotic Note  Rhonda Lynch is a 62 y.o. female admitted on 02/18/2019 with volume overload after missing HD, SOB, and concern for pneumonia.  Pharmacy has been consulted for Zosyn dosing.  Plan: Zosyn 3.375g IV Q12H (4-hour infusion).  Weight: 124 lb 9 oz (56.5 kg)  Temp (24hrs), Avg:99.4 F (37.4 C), Min:98.4 F (36.9 C), Max:101.4 F (38.6 C)  Recent Labs  Lab 03/09/2019 1049 03/05/19 0539  WBC 12.5* 14.2*  CREATININE 7.67* 5.59*    Estimated Creatinine Clearance: 7.6 mL/min (A) (by C-G formula based on SCr of 5.59 mg/dL (H)).    Allergies  Allergen Reactions  . Acetaminophen Nausea And Vomiting    Patient states Acetaminophen and Acetaminophen containing products make her nauseated. She demonstrated this 06/21/18 with nausea followed by emesis.  Marland Kitchen Hydroxyzine Itching    Thank you for allowing pharmacy to be a part of this patient's care.  Wynona Neat, PharmD, BCPS  03/05/2019 11:09 PM

## 2019-03-05 NOTE — Progress Notes (Signed)
Pt order for BIPAP is PRN and the pt is not in need of BIPAP at this time. Pt respiratory status is stable at this time on [place] 6 Lpm, RR 16, SpO2 96%. Pt resting comfortably. RT will continue to monitor.

## 2019-03-05 NOTE — Progress Notes (Signed)
Made charge RN aware that patient is refusing to put on nasal cannula. Charge RN attempted to put [place] on patient but patient refused. Patient beng combative and swing at nurses. Paged [facility], got orders for 1mg  ativan. Paged Rapid response nurse. Will continue to monitor patient closely.

## 2019-03-06 ENCOUNTER — Inpatient Hospital Stay (HOSPITAL_COMMUNITY): Payer: Medicare Other

## 2019-03-06 DIAGNOSIS — R4182 Altered mental status, unspecified: Secondary | ICD-10-CM | POA: Diagnosis not present

## 2019-03-06 DIAGNOSIS — F1490 Cocaine use, unspecified, uncomplicated: Secondary | ICD-10-CM

## 2019-03-06 DIAGNOSIS — J984 Other disorders of lung: Secondary | ICD-10-CM

## 2019-03-06 DIAGNOSIS — T405X1A Poisoning by cocaine, accidental (unintentional), initial encounter: Secondary | ICD-10-CM

## 2019-03-06 DIAGNOSIS — J810 Acute pulmonary edema: Secondary | ICD-10-CM

## 2019-03-06 DIAGNOSIS — J9601 Acute respiratory failure with hypoxia: Secondary | ICD-10-CM

## 2019-03-06 DIAGNOSIS — I5023 Acute on chronic systolic (congestive) heart failure: Secondary | ICD-10-CM

## 2019-03-06 DIAGNOSIS — Z992 Dependence on renal dialysis: Secondary | ICD-10-CM

## 2019-03-06 DIAGNOSIS — N186 End stage renal disease: Secondary | ICD-10-CM

## 2019-03-06 LAB — CBC
HCT: 20.7 % — ABNORMAL LOW (ref 36.0–46.0)
HCT: 28.2 % — ABNORMAL LOW (ref 36.0–46.0)
HCT: 27.4 % — ABNORMAL LOW (ref 36.0–46.0)
Hemoglobin: 6.5 g/dL — CL (ref 12.0–15.0)
Hemoglobin: 8.7 g/dL — ABNORMAL LOW (ref 12.0–15.0)
Hemoglobin: 8.5 g/dL — ABNORMAL LOW (ref 12.0–15.0)
MCH: 22.3 pg — ABNORMAL LOW (ref 26.0–34.0)
MCH: 22.7 pg — ABNORMAL LOW (ref 26.0–34.0)
MCH: 22.8 pg — ABNORMAL LOW (ref 26.0–34.0)
MCHC: 31.4 g/dL (ref 30.0–36.0)
MCHC: 30.9 g/dL (ref 30.0–36.0)
MCHC: 31 g/dL (ref 30.0–36.0)
MCV: 70.9 fL — ABNORMAL LOW (ref 80.0–100.0)
MCV: 73.6 fL — ABNORMAL LOW (ref 80.0–100.0)
MCV: 73.5 fL — ABNORMAL LOW (ref 80.0–100.0)
Platelets: 243 10*3/uL (ref 150–400)
Platelets: 306 10*3/uL (ref 150–400)
Platelets: 241 10*3/uL (ref 150–400)
RBC: 2.92 MIL/uL — ABNORMAL LOW (ref 3.87–5.11)
RBC: 3.83 MIL/uL — ABNORMAL LOW (ref 3.87–5.11)
RBC: 3.73 MIL/uL — ABNORMAL LOW (ref 3.87–5.11)
RDW: 23.6 % — ABNORMAL HIGH (ref 11.5–15.5)
RDW: 24.8 % — ABNORMAL HIGH (ref 11.5–15.5)
RDW: 24.5 % — ABNORMAL HIGH (ref 11.5–15.5)
WBC: 10.6 10*3/uL — ABNORMAL HIGH (ref 4.0–10.5)
WBC: 15.2 10*3/uL — ABNORMAL HIGH (ref 4.0–10.5)
WBC: 16 10*3/uL — ABNORMAL HIGH (ref 4.0–10.5)
nRBC: 0 % (ref 0.0–0.2)
nRBC: 0.1 % (ref 0.0–0.2)
nRBC: 0.2 % (ref 0.0–0.2)

## 2019-03-06 LAB — BASIC METABOLIC PANEL
Anion gap: 12 (ref 5–15)
Anion gap: 11 (ref 5–15)
BUN: 17 mg/dL (ref 8–23)
BUN: 18 mg/dL (ref 8–23)
CO2: 25 mmol/L (ref 22–32)
CO2: 25 mmol/L (ref 22–32)
Calcium: 8.5 mg/dL — ABNORMAL LOW (ref 8.9–10.3)
Calcium: 8.3 mg/dL — ABNORMAL LOW (ref 8.9–10.3)
Chloride: 99 mmol/L (ref 98–111)
Chloride: 98 mmol/L (ref 98–111)
Creatinine, Ser: 3.59 mg/dL — ABNORMAL HIGH (ref 0.44–1.00)
Creatinine, Ser: 3.77 mg/dL — ABNORMAL HIGH (ref 0.44–1.00)
GFR calc Af Amer: 15 mL/min — ABNORMAL LOW (ref >60)
GFR calc Af Amer: 14 mL/min — ABNORMAL LOW (ref >60)
GFR calc non Af Amer: 13 mL/min — ABNORMAL LOW (ref >60)
GFR calc non Af Amer: 12 mL/min — ABNORMAL LOW (ref >60)
Glucose, Bld: 201 mg/dL — ABNORMAL HIGH (ref 70–99)
Glucose, Bld: 253 mg/dL — ABNORMAL HIGH (ref 70–99)
Potassium: 4 mmol/L (ref 3.5–5.1)
Potassium: 3.7 mmol/L (ref 3.5–5.1)
Sodium: 136 mmol/L (ref 135–145)
Sodium: 134 mmol/L — ABNORMAL LOW (ref 135–145)

## 2019-03-06 LAB — BLOOD GAS, ARTERIAL
Acid-Base Excess: 1.7 mmol/L (ref 0.0–2.0)
Bicarbonate: 26 mmol/L (ref 20.0–28.0)
Drawn by: 275531
O2 Content: 1 L/min
O2 Saturation: 99.6 %
Patient temperature: 98.6
pCO2 arterial: 43.2 mmHg (ref 32.0–48.0)
pH, Arterial: 7.397 (ref 7.350–7.450)
pO2, Arterial: 191 mmHg — ABNORMAL HIGH (ref 83.0–108.0)

## 2019-03-06 LAB — RENAL FUNCTION PANEL
Albumin: 2 g/dL — ABNORMAL LOW (ref 3.5–5.0)
Anion gap: 11 (ref 5–15)
BUN: 18 mg/dL (ref 8–23)
CO2: 29 mmol/L (ref 22–32)
Calcium: 8.4 mg/dL — ABNORMAL LOW (ref 8.9–10.3)
Chloride: 97 mmol/L — ABNORMAL LOW (ref 98–111)
Creatinine, Ser: 4.38 mg/dL — ABNORMAL HIGH (ref 0.44–1.00)
GFR calc Af Amer: 11 mL/min — ABNORMAL LOW (ref >60)
GFR calc non Af Amer: 10 mL/min — ABNORMAL LOW (ref >60)
Glucose, Bld: 101 mg/dL — ABNORMAL HIGH (ref 70–99)
Phosphorus: 4.2 mg/dL (ref 2.5–4.6)
Potassium: 3.5 mmol/L (ref 3.5–5.1)
Sodium: 137 mmol/L (ref 135–145)

## 2019-03-06 LAB — GLUCOSE, CAPILLARY
Glucose-Capillary: 77 mg/dL (ref 70–99)
Glucose-Capillary: 165 mg/dL — ABNORMAL HIGH (ref 70–99)
Glucose-Capillary: 220 mg/dL — ABNORMAL HIGH (ref 70–99)
Glucose-Capillary: 254 mg/dL — ABNORMAL HIGH (ref 70–99)
Glucose-Capillary: 252 mg/dL — ABNORMAL HIGH (ref 70–99)

## 2019-03-06 LAB — PREPARE RBC (CROSSMATCH)

## 2019-03-06 LAB — POCT I-STAT 7, (LYTES, BLD GAS, ICA,H+H)
Acid-Base Excess: 4 mmol/L — ABNORMAL HIGH (ref 0.0–2.0)
Bicarbonate: 30.3 mmol/L — ABNORMAL HIGH (ref 20.0–28.0)
Calcium, Ion: 1.15 mmol/L (ref 1.15–1.40)
HCT: 29 % — ABNORMAL LOW (ref 36.0–46.0)
Hemoglobin: 9.9 g/dL — ABNORMAL LOW (ref 12.0–15.0)
O2 Saturation: 100 %
Patient temperature: 98.6
Potassium: 4.2 mmol/L (ref 3.5–5.1)
Sodium: 137 mmol/L (ref 135–145)
TCO2: 32 mmol/L (ref 22–32)
pCO2 arterial: 50.6 mmHg — ABNORMAL HIGH (ref 32.0–48.0)
pH, Arterial: 7.385 (ref 7.350–7.450)
pO2, Arterial: 396 mmHg — ABNORMAL HIGH (ref 83.0–108.0)

## 2019-03-06 LAB — TROPONIN I (HIGH SENSITIVITY): Troponin I (High Sensitivity): 24 ng/L — ABNORMAL HIGH (ref <18)

## 2019-03-06 LAB — MRSA PCR SCREENING: MRSA by PCR: NEGATIVE

## 2019-03-06 MED ORDER — IPRATROPIUM-ALBUTEROL 0.5-2.5 (3) MG/3ML IN SOLN: 3.0000 mL | RESPIRATORY_TRACT | Status: DC | PRN

## 2019-03-06 MED ORDER — MIDAZOLAM HCL 2 MG/2ML IJ SOLN
INTRAMUSCULAR | Status: AC
  Filled 2019-03-06: qty 2

## 2019-03-06 MED ORDER — FENTANYL 2500MCG IN NS 250ML (10MCG/ML) PREMIX INFUSION
0.0000 ug/h | INTRAVENOUS | Status: DC
  Administered 2019-03-06: 23:00:00 25 ug/h via INTRAVENOUS
  Filled 2019-03-06: qty 250

## 2019-03-06 MED ORDER — DILTIAZEM HCL-DEXTROSE 100-5 MG/100ML-% IV SOLN (PREMIX)
5.0000 mg/h | INTRAVENOUS | Status: DC
  Filled 2019-03-06: qty 100

## 2019-03-06 MED ORDER — ROCURONIUM BROMIDE 50 MG/5ML IV SOLN
50.0000 mg | Freq: Once | INTRAVENOUS | Status: AC
  Administered 2019-03-06: 19:00:00 50 mg via INTRAVENOUS

## 2019-03-06 MED ORDER — PREDNISONE 20 MG PO TABS: 20.0000 mg | ORAL_TABLET | Freq: Every day | ORAL | Status: DC

## 2019-03-06 MED ORDER — FENTANYL CITRATE (PF) 100 MCG/2ML IJ SOLN
INTRAMUSCULAR | Status: AC
  Filled 2019-03-06: qty 2

## 2019-03-06 MED ORDER — INSULIN ASPART 100 UNIT/ML ~~LOC~~ SOLN
2.0000 [IU] | SUBCUTANEOUS | Status: DC
  Administered 2019-03-06 (×2): 6 [IU] via SUBCUTANEOUS
  Administered 2019-03-07: 2 [IU] via SUBCUTANEOUS
  Administered 2019-03-07: 4 [IU] via SUBCUTANEOUS
  Administered 2019-03-07 (×2): 2 [IU] via SUBCUTANEOUS
  Administered 2019-03-07: 4 [IU] via SUBCUTANEOUS

## 2019-03-06 MED ORDER — PROPOFOL 500 MG/50ML IV EMUL
INTRAVENOUS | Status: AC
  Filled 2019-03-06: qty 50

## 2019-03-06 MED ORDER — PREDNISONE 20 MG PO TABS: 30.0000 mg | ORAL_TABLET | Freq: Every day | ORAL | Status: DC

## 2019-03-06 MED ORDER — PREDNISONE 20 MG PO TABS
40.0000 mg | ORAL_TABLET | Freq: Every day | ORAL | Status: DC
  Administered 2019-03-06 – 2019-03-07 (×2): 40 mg via ORAL
  Filled 2019-03-06 (×2): qty 2

## 2019-03-06 MED ORDER — METOPROLOL TARTRATE 5 MG/5ML IV SOLN
5.0000 mg | Freq: Once | INTRAVENOUS | Status: AC
  Administered 2019-03-06: 19:00:00 5 mg via INTRAVENOUS
  Filled 2019-03-06: qty 5

## 2019-03-06 MED ORDER — PREDNISONE 10 MG PO TABS: 10.0000 mg | ORAL_TABLET | Freq: Every day | ORAL | Status: DC

## 2019-03-06 MED ORDER — MIDAZOLAM HCL 2 MG/2ML IJ SOLN
2.0000 mg | INTRAMUSCULAR | Status: DC | PRN
  Administered 2019-03-09: 2 mg via INTRAVENOUS
  Filled 2019-03-06: qty 2

## 2019-03-06 MED ORDER — PRO-STAT SUGAR FREE PO LIQD
30.0000 mL | Freq: Two times a day (BID) | ORAL | Status: DC
  Administered 2019-03-06 – 2019-03-07 (×2): 30 mL via ORAL
  Filled 2019-03-06 (×3): qty 30

## 2019-03-06 MED ORDER — DARBEPOETIN ALFA 200 MCG/0.4ML IJ SOSY: 200.0000 ug | PREFILLED_SYRINGE | INTRAMUSCULAR | Status: DC

## 2019-03-06 MED ORDER — CHLORHEXIDINE GLUCONATE 0.12% ORAL RINSE (MEDLINE KIT)
15.0000 mL | Freq: Two times a day (BID) | OROMUCOSAL | Status: DC
  Administered 2019-03-06 – 2019-03-15 (×18): 15 mL via OROMUCOSAL

## 2019-03-06 MED ORDER — SODIUM CHLORIDE 0.9% IV SOLUTION: Freq: Once | INTRAVENOUS | Status: DC

## 2019-03-06 MED ORDER — FENTANYL CITRATE (PF) 100 MCG/2ML IJ SOLN
50.0000 ug | INTRAMUSCULAR | Status: DC | PRN
  Administered 2019-03-10 – 2019-03-13 (×13): 50 ug via INTRAVENOUS
  Filled 2019-03-06 (×14): qty 2

## 2019-03-06 MED ORDER — CHLORHEXIDINE GLUCONATE CLOTH 2 % EX PADS
6.0000 | MEDICATED_PAD | Freq: Every day | CUTANEOUS | Status: DC
  Administered 2019-03-06 – 2019-03-07 (×2): 6 via TOPICAL

## 2019-03-06 MED ORDER — ORAL CARE MOUTH RINSE
15.0000 mL | OROMUCOSAL | Status: DC
  Administered 2019-03-06 – 2019-03-13 (×68): 15 mL via OROMUCOSAL

## 2019-03-06 MED ORDER — FENTANYL CITRATE (PF) 100 MCG/2ML IJ SOLN
50.0000 ug | Freq: Once | INTRAMUSCULAR | Status: AC
  Administered 2019-03-06: 19:00:00 50 ug via INTRAVENOUS

## 2019-03-06 MED ORDER — HEPARIN SODIUM (PORCINE) 1000 UNIT/ML DIALYSIS
1000.0000 [IU] | INTRAMUSCULAR | Status: DC | PRN
  Administered 2019-03-06: 20:00:00 1700 [IU] via INTRAVENOUS_CENTRAL
  Administered 2019-03-09: 3400 [IU] via INTRAVENOUS_CENTRAL
  Administered 2019-03-11: 20:00:00 1000 [IU] via INTRAVENOUS_CENTRAL
  Filled 2019-03-06: qty 4
  Filled 2019-03-06 (×4): qty 6

## 2019-03-06 MED ORDER — ETOMIDATE 2 MG/ML IV SOLN
20.0000 mg | Freq: Once | INTRAVENOUS | Status: AC
  Administered 2019-03-06: 19:00:00 20 mg via INTRAVENOUS

## 2019-03-06 MED ORDER — ATROPINE SULFATE 1 MG/10ML IJ SOSY
PREFILLED_SYRINGE | INTRAMUSCULAR | Status: AC
  Filled 2019-03-06: qty 10

## 2019-03-06 MED ORDER — MAGNESIUM SULFATE 2 GM/50ML IV SOLN
2.0000 g | Freq: Once | INTRAVENOUS | Status: AC
  Administered 2019-03-06: 2 g via INTRAVENOUS
  Filled 2019-03-06: qty 50

## 2019-03-06 MED ORDER — HALOPERIDOL LACTATE 5 MG/ML IJ SOLN
2.0000 mg | Freq: Once | INTRAMUSCULAR | Status: AC | PRN
  Administered 2019-03-06: 02:00:00 2 mg via INTRAVENOUS
  Filled 2019-03-06: qty 1

## 2019-03-06 NOTE — Code Documentation (Signed)
  Patient Name: Rhonda Lynch   MRN: [id number]   Date of Birth/ Sex: [birth year] , female      Admission Date: 02/27/2019  Attending Provider: Mariel Aloe, MD  Primary Diagnosis: Volume overload   Indication: Pt was in her usual state of health until this PM, when she was noted to have agonal breathing. Code blue was subsequently called. At the time of arrival on scene, ACLS protocol was underway.   Technical Description:  - CPR performance duration:  0 minute  - Was defibrillation or cardioversion used? No   - Was external pacer placed? No  - Was patient intubated pre/post CPR? No   Medications Administered: Y = Yes; Blank = No Amiodarone    Atropine    Calcium    Epinephrine    Lidocaine    Magnesium    Norepinephrine    Phenylephrine    Sodium bicarbonate    Vasopressin     Post CPR evaluation:  - Final Status - Was patient successfully resuscitated ? Yes - What is current rhythm? Sinus rhythm   - What is current hemodynamic status?   Miscellaneous Information:  - Labs sent, including: ABG, BMP  - Primary team notified?  Multiple attemts to notify primary team. We signed out to critical team  - Family Notified?   - Additional notes/ transfer status: Transferred to ICU      Dewayne Hatch, MD  03/06/2019, 7:17 PM

## 2019-03-06 NOTE — Progress Notes (Signed)
RT transported pt to and from CT without event. RT will continue to monitor.

## 2019-03-06 NOTE — Consult Note (Addendum)
Name: Rhonda Lynch MRN: [id number] DOB: [birth year]    ADMISSION DATE:  02/20/2019 CONSULTATION DATE: 03/06/2019  REFERRING MD : [facility]  CHIEF COMPLAINT: Acute on chronic hypoxia  BRIEF PATIENT DESCRIPTION: 63 year old female with multiple health issues and noncompliance no acute distress.  SIGNIFICANT EVENTS  03/05/2019 hypoxic event  STUDIES:  03/05/2019 CT scan again shows bilateral airspace disease and cardiomegaly   HISTORY OF PRESENT ILLNESS:  63 year old end-stage renal disease patient with a plethora of health issues that are well documented.  Known history of heroin and cocaine abuse.  Noted to have bilateral airspace disease on CT scan since March 2020 also with cardiomegaly.  She has known hypoxic respiratory failure and is O2 dependent nocturnally.  She originally presented to the emergency department with epigastric pain, shortness of breath and questionable pneumonia and pulmonary edema.  She receives hemodialysis Tuesdays Thursdays and Saturdays via right IJ tunnel catheter.  During the night of 03/05/2019 she refused to wear her oxygen.  She desaturated.  Physician called in order for Ativan which was given.  Shortly thereafter rapid response team was called due to hypoxia she was placed back on her oxygen and improved.  The time of examination on 03/06/2019 she was on 4 L nasal cannula O2 saturations 100%.  She is awake alert although unpleasant.  And in no respiratory distress whatsoever.  Her CT scan is essentially unchanged from March 2020 showed bilateral airspace disease questionable cocaine toxicity versus other pulmonary disturbances.  Continue with oxygen she does not appear to need BiPAP at this time.  Does not appear to meet transferred to a level of heart care at this time.  Pulmonary critical care will be available PRN.  PAST MEDICAL HISTORY :   has a past medical history of Diabetes mellitus without complication ([place]), ESRD (end stage renal disease) on dialysis  ([place]), Hepatitis C, Hypertension, Oxygen deficiency, Stroke ([place]), and Substance abuse ([place]).  has a past surgical history that includes IR Fluoro Guide CV Line Right (02/18/2018); IR US Guide Vasc Access Right (02/18/2018); IR Fluoro Guide CV Line Right (02/26/2018); AV fistula placement (Left, 03/05/2018); Hematoma evacuation (Left, 03/06/2018); IR Removal Tun Cv Cath W/O FL (04/27/2018); TEE without cardioversion (N/A, 04/29/2018); Insertion of dialysis catheter (Left, 04/29/2018); AV fistula placement (Left, 07/21/2018); Bascilic vein transposition (Left, 09/29/2018); I&D extremity (Left, 10/06/2018); venogram (Left, 10/27/2018); AV fistula placement (Left, 10/27/2018); Thrombectomy and revision of arterioventous (av) goretex  graft (Left, 12/11/2018); IR US Guide Vasc Access Right (01/14/2019); IR Fluoro Guide CV Line Right (01/14/2019); Insertion of dialysis catheter (Right, 01/19/2019); AV fistula placement (Right, 01/19/2019); IR THROMBECTOMY AV FISTULA W/THROMBOLYSIS INC/SHUNT/IMG RIGHT (02/20/2019); and IR US Guide Vasc Access Right (02/20/2019). Prior to Admission medications   Medication Sig Start Date End Date Taking? Authorizing Provider  amLODipine (NORVASC) 5 MG tablet Take 1 tablet (5 mg total) by mouth at bedtime. 02/07/19   Domenic Polite, MD  aspirin 81 MG EC tablet Take 1 tablet (81 mg total) by mouth daily. 02/07/19   Domenic Polite, MD  B Complex-C-Zn-Folic Acid (DIALYVITE [id number]) 0.8 MG TABS Take 1 tablet by mouth daily. 11/04/18   [provider]  Blood Glucose Monitoring Suppl (ACCU-CHEK AVIVA) device Use as instructed 10/29/18 10/29/19  Isabelle Course, MD  calcitRIOL (ROCALTROL) 0.25 MCG capsule Take 1 capsule (0.25 mcg total) by mouth every Monday, Wednesday, and Friday with hemodialysis. Patient taking differently: Take 0.25 mcg by mouth every Tuesday, Thursday, and Saturday at 6 PM.  02/09/19  Domenic Polite, MD  diclofenac sodium (VOLTAREN) 1 % GEL Apply 2 g topically 4 (four) times  daily. 02/21/19   Mariel Aloe, MD  fluticasone (FLONASE) 50 MCG/ACT nasal spray Place 2 sprays into both nostrils as needed for allergies.     [provider]  gabapentin (NEURONTIN) 100 MG capsule Take 1 capsule (100 mg total) by mouth 2 (two) times daily for 30 days. Patient taking differently: Take 100 mg by mouth 3 (three) times daily.  02/07/19 03/09/19  Domenic Polite, MD  glucose blood (ACCU-CHEK AVIVA) test strip Use twice per day. 10/29/18   Isabelle Course, MD  hydrocortisone cream 1 % Apply 1 application topically 2 (two) times daily. Patient taking differently: Apply 1 application topically 2 (two) times daily as needed for itching.  01/20/19   Pokhrel, Corrie Mckusick, MD  insulin aspart (NOVOLOG) 100 UNIT/ML injection Inject 5 Units into the skin 3 (three) times daily with meals. Patient taking differently: Inject 3 Units into the skin 3 (three) times daily with meals.  10/13/18   Alphonzo Grieve, MD  insulin glargine (LANTUS) 100 UNIT/ML injection Inject 0.05 mLs (5 Units total) into the skin at bedtime. 02/07/19   Domenic Polite, MD  Insulin Syringe-Needle U-100 (INSULIN SYRINGE .3CC/31GX5/16") 31G X 5/16" 0.3 ML MISC Inject 1 each into the skin 4 (four) times daily. 06/27/18   Clent Demark, PA-C  Lancets (ACCU-CHEK SOFT [facility]) lancets Use twice per day. 10/29/18   Isabelle Course, MD  oxyCODONE (OXY IR/ROXICODONE) 5 MG immediate release tablet Take 1 tablet (5 mg total) by mouth every 6 (six) hours as needed for moderate pain or severe pain. 02/07/19   Domenic Polite, MD  pantoprazole (PROTONIX) 40 MG tablet Take 1 tablet (40 mg total) by mouth daily. 02/07/19   Domenic Polite, MD  polyethylene glycol (MIRALAX / GLYCOLAX) 17 g packet Take 17 g by mouth daily. Patient taking differently: Take 17 g by mouth daily as needed for mild constipation or moderate constipation.  02/07/19   Domenic Polite, MD  sevelamer carbonate (RENVELA) 800 MG tablet Take 2 tablets (1,600 mg total) by mouth  3 (three) times daily with meals. 02/07/19   Domenic Polite, MD   Allergies  Allergen Reactions  . Acetaminophen Nausea And Vomiting    Patient states Acetaminophen and Acetaminophen containing products make her nauseated. She demonstrated this 06/21/18 with nausea followed by emesis.  Marland Kitchen Hydroxyzine Itching    FAMILY HISTORY:  family history is not on file. SOCIAL HISTORY:  reports that she has been smoking cigarettes. She has a 24.00 pack-year smoking history. She has never used smokeless tobacco. She reports previous alcohol use. She reports current drug use. Drugs: Cocaine and Heroin.  REVIEW OF SYSTEMS:   Not able to do due to uncooperative patient SUBJECTIVE:  63 year old female who is in no acute distress at rest currently on 4 L nasal cannula sats 100%. VITAL SIGNS: Temp:  [98.4 F (36.9 C)-101.4 F (38.6 C)] 101.4 F (38.6 C) (06/25 2302) Pulse Rate:  [95-122] 95 (06/26 0400) Resp:  [18-23] 20 (06/26 0212) BP: (122-141)/(66-83) 122/83 (06/26 0212) SpO2:  [77 %-100 %] 100 % (06/26 0400) Weight:  [56.5 kg] 56.5 kg (06/25 1800)  PHYSICAL EXAMINATION: General: 63 year old female who arouses to voice who is rather unpleasant to converse well but is orientated to person place and time Neuro: Orientated.  Somewhat unpleasant demeanor HEENT: No JVD or lymphadenopathy is appreciated.  Dry oral mucosa. Cardiovascular: Heart sounds are regular Lungs: Decreased  in bases, coarse rhonchi expiratory wheezes Abdomen: Positive bowel sounds Musculoskeletal: Intact Skin: Plus edema extremity  Recent Labs  Lab 02/27/2019 1049 02/22/2019 1057 03/05/19 0539 03/06/19 0531  NA 136 135 136 137  K 5.0 5.1 4.5 3.5  CL 100  --  99 97*  CO2 25  --  25 29  BUN 41*  --  24* 18  CREATININE 7.67*  --  5.59* 4.38*  GLUCOSE 91  --  99 101*   Recent Labs  Lab 02/10/2019 1049  03/05/19 0539 03/05/19 1240 03/06/19 0531  HGB 7.7*   < > 7.2* 7.6* 6.5*  HCT 25.6*   < > 23.1* 25.0* 20.7*  WBC  12.5*  --  14.2*  --  10.6*  PLT 288  --  272  --  243   < > = values in this interval not displayed.   Dg Chest 2 View  Result Date: 03/05/2019 CLINICAL DATA:  Shortness of breath.  End-stage renal disease EXAM: CHEST - 2 VIEW COMPARISON:  March 04, 2019 FINDINGS: There is extensive airspace consolidation throughout the right lung, increased compared to 1 day prior. There is more patchy infiltrate in the left upper and lower lobes, similar to 1 day prior. There is a small left pleural effusion. There is cardiomegaly with pulmonary vascularity within normal limits. Central catheter tip is in the right atrium. No pneumothorax. There is aortic atherosclerosis. No evident adenopathy. No bone lesions. IMPRESSION: Widespread airspace consolidation which has increased throughout the right lung compared to 1 day prior. Stable airspace opacity on the left. Suspect multifocal pneumonia, potentially superimposed on a degree of pulmonary edema. There is stable cardiomegaly. There is a small left pleural effusion. Central catheter tip in right atrium. No pneumothorax. Aortic Atherosclerosis (ICD10-I70.0). Electronically Signed   By: Lowella Grip III M.D.   On: 03/05/2019 10:33   Ct Chest Wo Contrast  Result Date: 03/05/2019 CLINICAL DATA:  Acute respiratory failure, pneumonia EXAM: CT CHEST WITHOUT CONTRAST TECHNIQUE: Multidetector CT imaging of the chest was performed following the standard protocol without IV contrast. COMPARISON:  Same-day chest radiographs, CT chest, 02/03/2019 FINDINGS: Cardiovascular: Gross cardiomegaly. Small pericardial effusion, new compared to prior examination. Large bore right chest multi lumen vascular catheter. Mediastinum/Nodes: No enlarged mediastinal, hilar, or axillary lymph nodes. Thyroid gland, trachea, and esophagus demonstrate no significant findings. Lungs/Pleura: There is dense, primarily peribronchovascular heterogeneous and consolidative airspace opacity bilaterally,  worst in the right upper lobe and worsened in comparison to prior CT dated 02/03/2019. There is underlying centrilobular emphysema. Small bilateral pleural effusions. Upper Abdomen: No acute abnormality. Musculoskeletal: No chest wall mass or suspicious bone lesions identified. IMPRESSION: 1. There is dense, primarily peribronchovascular heterogeneous and consolidative airspace opacity bilaterally, worst in the right upper lobe and worsened in comparison to prior CT dated 02/03/2019. There is underlying centrilobular emphysema. Small bilateral pleural effusions. Findings are most consistent with multifocal infection although severe edema or ARDS could have this appearance. 2. Small pericardial effusion, new compared to prior examination. Gross cardiomegaly. Electronically Signed   By: Eddie Candle M.D.   On: 03/05/2019 14:42   Ct Abdomen Pelvis W Contrast  Result Date: 03/06/2019 CLINICAL DATA:  Abdominal pain anteriorly EXAM: CT ABDOMEN AND PELVIS WITH CONTRAST TECHNIQUE: Multidetector CT imaging of the abdomen and pelvis was performed using the standard protocol following bolus administration of intravenous contrast. CONTRAST:  60mL ISOVUE-300 IOPAMIDOL (ISOVUE-300) INJECTION 61% COMPARISON:  02/05/2019, 12/03/2018, 04/20/2018 FINDINGS: Lower chest: Bilateral interstitial and alveolar airspace disease  in the right middle lobe, right lower lobe, lingula and left lower lobe. Bilateral small pleural effusions. Differential considerations include unresolved pneumonia including atypical pneumonia versus an element of pulmonary edema. New small pericardial effusion. Hepatobiliary: No focal liver abnormality is seen. Status post cholecystectomy. No biliary dilatation. Pancreas: Unremarkable. No pancreatic ductal dilatation or surrounding inflammatory changes. Spleen: Normal spleen size. 17 mm hypodense splenic mass unchanged compared with multiple prior examinations. Adrenals/Urinary Tract: Adrenal glands are  unremarkable. Kidneys are normal, without renal calculi, focal lesion, or hydronephrosis. Bladder is unremarkable. Bilateral nonspecific perinephric stranding. Stomach/Bowel: Stomach is within normal limits. Appendix appears normal. No evidence of bowel wall thickening, distention, or inflammatory changes. Vascular/Lymphatic: Normal caliber abdominal aorta with mild atherosclerosis. No lymphadenopathy. Reproductive: Prior hysterectomy. 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 Other: No fluid collection or hematoma. Small amount of pelvic free fluid. Small amount of pelvic free fluid. Interval worsening anasarca. Musculoskeletal: No acute osseous abnormality. No aggressive osseous lesion. IMPRESSION: 1. No acute abdominal or pelvic pathology. 2. Interval worsening of anasarca.  Small volume pelvic free fluid. 3. Bilateral interstitial and alveolar airspace disease in the right middle lobe, right lower lobe, lingula and left lower lobe. Bilateral small pleural effusions. Differential considerations include unresolved pneumonia including atypical pneumonia versus an element of pulmonary edema. 4. New small pericardial effusion. Electronically Signed   By: Kathreen Devoid   On: 02/19/2019 12:46   Dg Chest Portable 1 View  Result Date: 02/19/2019 CLINICAL DATA:  Chest pain EXAM: PORTABLE CHEST 1 VIEW COMPARISON:  02/18/2019 FINDINGS: Cardiomegaly and vascular pedicle widening. Diffuse interstitial coarsening. Dialysis catheter on the right with tip at the right atrium. Probable small pleural effusions. IMPRESSION: Cardiomegaly with pulmonary edema. Electronically Signed   By: Monte Fantasia M.D.   On: 03/05/2019 10:53    ASSESSMENT  Patient Active Problem List   Diagnosis Date Noted  . Volume overload 02/11/2019  . History of CVA (cerebrovascular accident) 02/18/2019  . Hypoxia   . Acute metabolic encephalopathy   . Acute on chronic systolic CHF (congestive heart  failure) ([place]) 01/11/2019  . Fluid overload 01/11/2019  . Diarrhea 12/28/2018  . Metabolic acidosis, increased anion gap (IAG) 12/19/2018  . Volvulus ([place]) 12/19/2018  . Hyperkalemia 11/25/2018  . Cocaine overdose, accidental or unintentional, initial encounter ([place]) 11/25/2018  . CAP (community acquired pneumonia) 11/17/2018  . End-stage renal disease on hemodialysis ([place])   . Hypoglycemia 10/23/2018  . Non-intractable vomiting   . Other ascites   . Palliative care encounter   . Left arm pain   . Chronic hepatitis C virus genotype 1b infection ([place]) 09/28/2018  . ICH (intracerebral hemorrhage) ([place]) small left caudate due to HTN 07/17/2018  . HCAP (healthcare-associated pneumonia) 06/21/2018  . Substance induced mood disorder ([place]) 05/15/2018  . Agitation   . Hemodialysis catheter infection, initial encounter ([place])   . 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 Pulmonary embolism ([place]) 04/27/2018  . Sepsis ([place]) 04/26/2018  . Dyspnea 04/26/2018  . Chest pain 04/26/2018  . Tachycardia 04/26/2018  . Abdominal pain 04/18/2018  . Insulin dependent diabetes mellitus ([place]) 04/18/2018  . Right flank pain 04/18/2018  . Acute respiratory failure with hypoxia ([place]) 04/12/2018  . SVT (supraventricular tachycardia) ([place])   . Chronic renal failure   . Hypertension   . Noncompliance   . Acute pulmonary edema ([facility])   . Elevated troponin 03/19/2018  . Fever   . 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 Diffuse pulmonary alveolar hemorrhage   .  Hypoxemia   . Pulmonary infiltrate   . 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 ESRD on dialysis ([place]) 02/12/2018  . Acute respiratory failure ([place]) 02/12/2018  . Alcohol abuse 02/12/2018  . Microcytic anemia 02/12/2018  . Cocaine abuse ([place]) 02/12/2018  . Epigastric pain 02/12/2018  . Pneumonia 02/12/2018  . TOBACCO ABUSE 03/08/2010  . PNEUMONIA 03/08/2010   Acute on chronic hypoxic respiratory failure multifactorial. Volume overload  Extensive lung disease History of drug abuse Altered mental status with history of CVA  63 year old end-stage renal disease patient with known lung disease chronic cocaine use and was CT scan abnormal since March 2020 showing bilateral airspace disease and underlying central lobar emphysema.  Along with cardiomegaly.  During the night of 03/05/2019 she became more confused, took her oxygen off, was treated with Ativan for agitation, and became further hypoxic.  Once her oxygen was returned her sats improved at the time of evaluation on 03/06/2019 at 0 900 she is on 4 L nasal cannula with O2 saturations of 100% pulmonary critical care asked to evaluate.    PLAN: Dialysis with volume removal Bronchodilators O2 to keep saturations greater than 92% Add bronchodilators every 4 hours as needed Questionable if steroids but help at this point especially in a diabetic. Stop smoking Stop substance abuse Pulmonary critical care available as needed.   Richardson Landry Minor ACNP Maryanna Shape PCCM Pager [phone] till 1 pm If no answer page [phone] 03/06/2019, 9:29 AM    PCCM Attending:   63 yo FM, ESRD, chronic cocaine use, tobacco use, admitted for AHRF BL pna, CT imaging with peri-bronchovascular infiltrates, proximal perlymphatic and perihilar distribution with intralobular septal thickening and BL GGO.   I have personally reviewed the CT imaging.   BP 122/83 (BP Location: Left Arm)   Pulse 95   Temp (!) 101.4 F (38.6 C) (Oral)   Resp 20   Wt 56.5 kg   SpO2 100%   BMI 23.54 kg/m   Gen: Chronically ill appearing fm, resting in bed  Heart: RRR, s1 s2 Lungs: BL rhonchi, no wheeze  Abd: tender to palpation  Ext: no edema   Labs reviewed: prior UDS's + cocaine  CT Chest: BL infiltrates consistent with crack lung   A: AHRF on [place] O2  Cocaine abuse  - possible  BL PNA, likely component of non-cardiogenic pulmonary edema and BL acute pneumonitis from cocaine inhalation  ESRD on iHD   P:  Would culture and ok to continue abx for now.  Would start short taper of prednisone to help with the inflammation and may speed up her recovery I have placed these orders Cocaine cessation is a must  We appreciate the consult  Please call with any questions   Garner Nash, DO [facility] 03/06/2019 1:34 PM  Personal pager: [phone] If unanswered, please page CCM On-call: [phone]

## 2019-03-06 NOTE — Procedures (Signed)
Intubation Procedure Note Rhonda Lynch [id number] 1956-06-29  Procedure: Intubation Indications: Airway protection and maintenance  Procedure Details Consent: Unable to obtain consent because of emergent medical necessity. Time Out: Verified patient identification, verified procedure, site/side was marked, verified correct patient position, special equipment/implants available, medications/allergies/relevent history reviewed, required imaging and test results available.  Performed  Maximum sterile technique was used including gloves and mask.  4    Evaluation Hemodynamic Status: BP stable throughout; O2 sats: stable Patient's Current Condition: stable Complications: No apparent complications Patient did tolerate procedure well. Chest X-ray ordered to verify placement.  CXR: pending.   Rhonda Lynch 03/06/2019

## 2019-03-06 NOTE — Procedures (Signed)
At 1735 called to beside to assist fellow RN for clotted HD circuit, fellow RN completed 1 unit of RBC as ordered, pt finished drinking her ginger ale and stated " I am going to lay back now" attempted to rinse pt blood back noted that pt had started agonal breathing. CN called to beside to assess pt, attempted to arouse pt bp 140/80, O2 sat 100% heart rate 140.  Unable to return patients blood d/t clotting of circuit and unable to arouse patient. CN at this time called rapid response.  Rapid response at bedside see rapid response notes.

## 2019-03-06 NOTE — Progress Notes (Signed)
RT note: called for code in [place]. Patient agonal breathing. MD at bedside. RT unable to intubate patient because closing vocal cords and unable to pass et tube. RT placed oral airway and bag valve masked patient until patient placed on NRB and transported to 2M01 and intubated by MD.

## 2019-03-06 NOTE — Progress Notes (Addendum)
[facility] Progress Note   Dialysis Orders: TTS [place] 4h 53.5kg 2/2.5 bath (R AVG) / TDC Hep 3000 - mircera 225 q 2wks (last 6/18) - venofer 50/wk - hect 4ug tiw  Assessment/Plan: 1. 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 Serial HD 3.1 L Wed and 2.5 L Thurs with post wt 56.5 - still 3 up from EDW  RepeatCXR this am with increased consolidation in right lung. CT 6/25 consolidation bilaterally worse on right with small bilat pleural effusions compared with May 2020, new small pericardial effusion.  On Zosyn.  2. Abd pain/ chest pain: trop negative 3. ESRD- TTS. OFF schedule Serial HD K 3.5 use 4 K today. AVGG(placed 5/11)hadfirst use here on 6/11. On 6/12 IR did declot.AVG is open andpt states they are using it, still has TDC. AVG worked well last night. Can prob remove TDC while here. If stable Saturday - defer next HD until Monday 4. HTN/ volume - BP's high in ED, improved with meds/volume removal  5. Anemia ckd - Hgb 7.2.> 6.5 - tsat 6^ 6/2 - has had some IV Fe since then - repeat FOBT (was + in March) and also repeat Fe studies - transfuse 2 units PRBC on HD today and attempt to get closer to edw . ESA due 7/2 6. Nutrition- renal carb mod/vit alb 2 - very poor - add prostat - also has nepro - hold heparin for now  7. DM 8. Hx CVA 9. Hep C + 10. Acute hypoxia last night - O2 off - given ativan to calm patient - low hgb aggravating - transfuse as above, lower volume with HD  Myriam Jacobson, PA-C [place] [phone] 03/06/2019,8:10 AM  LOS: 1 day   Pt seen, examined and agree w A/P as above. Have d/w primary, not sure all this lung disease is edema.  Would suggest that Pulm see patient- they know her from last year.  Kelly Splinter  MD 03/06/2019, 11:33 AM    Subjective:   Leave me alone  Objective Vitals:   03/05/19 2302 03/06/19 0212  03/06/19 0300 03/06/19 0400  BP:  122/83    Pulse: (!) 118  100 95  Resp:  20    Temp: (!) 101.4 F (38.6 C)     TempSrc: Oral     SpO2: 95% 100% 100% 100%  Weight:       Physical Exam General:very irritable - disengaged exam limited Heart: RRR Lungs: rhonchi right Abdomen: deferred Extremities: no LE edema Dialysis Access: right AVGG + brut   Additional Objective Labs: Basic Metabolic Panel: Recent Labs  Lab 03/09/2019 1049 02/11/2019 1057 03/05/19 0539 03/06/19 0531  NA 136 135 136 137  K 5.0 5.1 4.5 3.5  CL 100  --  99 97*  CO2 25  --  25 29  GLUCOSE 91  --  99 101*  BUN 41*  --  24* 18  CREATININE 7.67*  --  5.59* 4.38*  CALCIUM 9.1  --  8.6* 8.4*  PHOS  --   --   --  4.2   Liver Function Tests: Recent Labs  Lab 02/19/2019 1049 03/06/19 0531  AST 43*  --   ALT 23  --   ALKPHOS 110  --   BILITOT 0.8  --   PROT 7.8  --   ALBUMIN 2.1* 2.0*   Recent Labs  Lab 03/07/2019 1049  LIPASE 46  CBC: Recent Labs  Lab 02/28/2019 1049  03/05/19 0539 03/05/19 1240 03/06/19 0531  WBC 12.5*  --  14.2*  --  10.6*  NEUTROABS 9.2*  --   --   --   --   HGB 7.7*   < > 7.2* 7.6* 6.5*  HCT 25.6*   < > 23.1* 25.0* 20.7*  MCV 72.5*  --  71.3*  --  70.9*  PLT 288  --  272  --  243   < > = values in this interval not displayed.   Blood Culture    Component Value Date/Time   SDES BLOOD SITE NOT SPECIFIED 02/18/2019 1218   [place]  02/18/2019 1218    BOTTLES DRAWN AEROBIC ONLY Blood Culture results may not be optimal due to an inadequate volume of blood received in culture bottles   CULT  02/18/2019 1218    NO GROWTH 6 DAYS Performed at [facility], [place] [address], [place], [place] [address]    REPTSTATUS 02/24/2019 FINAL 02/18/2019 1218    Cardiac Enzymes: No results for input(s): CKTOTAL, CKMB, CKMBINDEX, TROPONINI in the last 168 hours. CBG: Recent Labs  Lab 03/05/19 0020 03/05/19 0748 03/05/19 1235 03/05/19 2233  GLUCAP 88 146* 177* 133*   Iron  Studies: No results for input(s): IRON, TIBC, TRANSFERRIN, FERRITIN in the last 72 hours. Lab Results  Component Value Date   INR 1.1 02/20/2019   INR 1.1 01/19/2019   INR 1.0 01/14/2019   Studies/Results: Dg Chest 2 View  Result Date: 03/05/2019 CLINICAL DATA:  Shortness of breath.  End-stage renal disease EXAM: CHEST - 2 VIEW COMPARISON:  March 04, 2019 FINDINGS: There is extensive airspace consolidation throughout the right lung, increased compared to 1 day prior. There is more patchy infiltrate in the left upper and lower lobes, similar to 1 day prior. There is a small left pleural effusion. There is cardiomegaly with pulmonary vascularity within normal limits. Central catheter tip is in the right atrium. No pneumothorax. There is aortic atherosclerosis. No evident adenopathy. No bone lesions. IMPRESSION: Widespread airspace consolidation which has increased throughout the right lung compared to 1 day prior. Stable airspace opacity on the left. Suspect multifocal pneumonia, potentially superimposed on a degree of pulmonary edema. There is stable cardiomegaly. There is a small left pleural effusion. Central catheter tip in right atrium. No pneumothorax. Aortic Atherosclerosis (ICD10-I70.0). Electronically Signed   By: Lowella Grip III M.D.   On: 03/05/2019 10:33   Ct Chest Wo Contrast  Result Date: 03/05/2019 CLINICAL DATA:  Acute respiratory failure, pneumonia EXAM: CT CHEST WITHOUT CONTRAST TECHNIQUE: Multidetector CT imaging of the chest was performed following the standard protocol without IV contrast. COMPARISON:  Same-day chest radiographs, CT chest, 02/03/2019 FINDINGS: Cardiovascular: Gross cardiomegaly. Small pericardial effusion, new compared to prior examination. Large bore right chest multi lumen vascular catheter. Mediastinum/Nodes: No enlarged mediastinal, hilar, or axillary lymph nodes. Thyroid gland, trachea, and esophagus demonstrate no significant findings. Lungs/Pleura: There  is dense, primarily peribronchovascular heterogeneous and consolidative airspace opacity bilaterally, worst in the right upper lobe and worsened in comparison to prior CT dated 02/03/2019. There is underlying centrilobular emphysema. Small bilateral pleural effusions. Upper Abdomen: No acute abnormality. Musculoskeletal: No chest wall mass or suspicious bone lesions identified. IMPRESSION: 1. There is dense, primarily peribronchovascular heterogeneous and consolidative airspace opacity bilaterally, worst in the right upper lobe and worsened in comparison to prior CT dated 02/03/2019. There is underlying centrilobular emphysema. Small bilateral pleural effusions. Findings are most consistent  with multifocal infection although severe edema or ARDS could have this appearance. 2. Small pericardial effusion, new compared to prior examination. Gross cardiomegaly. Electronically Signed   By: Eddie Candle M.D.   On: 03/05/2019 14:42   Ct Abdomen Pelvis W Contrast  Result Date: 03/07/2019 CLINICAL DATA:  Abdominal pain anteriorly EXAM: CT ABDOMEN AND PELVIS WITH CONTRAST TECHNIQUE: Multidetector CT imaging of the abdomen and pelvis was performed using the standard protocol following bolus administration of intravenous contrast. CONTRAST:  70mL ISOVUE-300 IOPAMIDOL (ISOVUE-300) INJECTION 61% COMPARISON:  02/05/2019, 12/03/2018, 04/20/2018 FINDINGS: Lower chest: Bilateral interstitial and alveolar airspace disease in the right middle lobe, right lower lobe, lingula and left lower lobe. Bilateral small pleural effusions. Differential considerations include unresolved pneumonia including atypical pneumonia versus an element of pulmonary edema. New small pericardial effusion. Hepatobiliary: No focal liver abnormality is seen. Status post cholecystectomy. No biliary dilatation. Pancreas: Unremarkable. No pancreatic ductal dilatation or surrounding inflammatory changes. Spleen: Normal spleen size. 17 mm hypodense splenic mass  unchanged compared with multiple prior examinations. Adrenals/Urinary Tract: Adrenal glands are unremarkable. Kidneys are normal, without renal calculi, focal lesion, or hydronephrosis. Bladder is unremarkable. Bilateral nonspecific perinephric stranding. Stomach/Bowel: Stomach is within normal limits. Appendix appears normal. No evidence of bowel wall thickening, distention, or inflammatory changes. Vascular/Lymphatic: Normal caliber abdominal aorta with mild atherosclerosis. No lymphadenopathy. Reproductive: Prior hysterectomy. 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 Other: No fluid collection or hematoma. Small amount of pelvic free fluid. Small amount of pelvic free fluid. Interval worsening anasarca. Musculoskeletal: No acute osseous abnormality. No aggressive osseous lesion. IMPRESSION: 1. No acute abdominal or pelvic pathology. 2. Interval worsening of anasarca.  Small volume pelvic free fluid. 3. Bilateral interstitial and alveolar airspace disease in the right middle lobe, right lower lobe, lingula and left lower lobe. Bilateral small pleural effusions. Differential considerations include unresolved pneumonia including atypical pneumonia versus an element of pulmonary edema. 4. New small pericardial effusion. Electronically Signed   By: Kathreen Devoid   On: 02/27/2019 12:46   Dg Chest Portable 1 View  Result Date: 02/10/2019 CLINICAL DATA:  Chest pain EXAM: PORTABLE CHEST 1 VIEW COMPARISON:  02/18/2019 FINDINGS: Cardiomegaly and vascular pedicle widening. Diffuse interstitial coarsening. Dialysis catheter on the right with tip at the right atrium. Probable small pleural effusions. IMPRESSION: Cardiomegaly with pulmonary edema. Electronically Signed   By: Monte Fantasia M.D.   On: 03/01/2019 10:53   Medications: . piperacillin-tazobactam (ZOSYN)  IV     . sodium chloride   Intravenous Once  . amLODipine  5 mg Oral QHS  . aspirin EC  81 mg Oral Daily  .  calcitRIOL  0.25 mcg Oral Q T,Th,Sat-1800  . Chlorhexidine Gluconate Cloth  6 each Topical Q0600  . gabapentin  100 mg Oral TID  . heparin  3,000 Units Dialysis Once in dialysis  . heparin  5,000 Units Subcutaneous Q8H  . insulin aspart  0-5 Units Subcutaneous QHS  . insulin aspart  0-9 Units Subcutaneous TID WC  . metoprolol tartrate  5 mg Intravenous Once  . nicotine  14 mg Transdermal Daily  . pantoprazole  40 mg Oral Daily  . sevelamer carbonate  1,600 mg Oral TID WC  . sodium chloride flush  3 mL Intravenous Q12H

## 2019-03-06 NOTE — Progress Notes (Signed)
PROGRESS NOTE    Rhonda Lynch  WIO:[id number] DOB: 1956-01-09 DOA: 02/19/2019 PCP: Clent Demark, PA-C   Brief Narrative: Rhonda Lynch is a 63 y.o. female with medical history significant of polysubstance abuse; CVA; nocturnal O2 requirement; HTN; DM; ESRD on TTS HD; and hepatitis C. Patient presented secondary to shortness of breath secondary to missed HD.   Assessment & Plan:   Principal Problem:   Volume overload Active Problems:   TOBACCO ABUSE   Acute respiratory failure (HCC)   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[place])   Acute pulmonary edema (HCC)   Hypertension   Insulin dependent diabetes mellitus ([place])   End-stage renal disease on hemodialysis (HCC)   Fluid overload   Volume overload Secondary to missed HD -Nephrology recommendations for HD  Pneumonia CT scan shows multifocal process.  Although somewhat indeterminate, with associated fever will treat as pneumonia.  Procalcitonin not obtained secondary to ESRD. SARS-CoV-2 negative on admission.  Patient has a history of BOOP.  Patient without any sputum production. -Pulmonology consult -Zosyn IV -Blood cultures pending  Acute on chronic systolic heart failure Patient with an EF of 40 to 45%.  Likely contributory to patient's overall picture.  Significant cardiomegaly which appears worsened on current CT from even one month prior. -Management with hemodialysis as mentioned above -Repeat Transthoracic Echocardiogram today; may need cardiology consult pending results  Acute respiratory failure with hypoxia Secondary to above problems.  Slightly worsened from yesterday. -Continue oxygen and wean to room air as able  Essential hypertension -Continue Norvasc  Diabetes mellitus, type 2 Patient is on insulin as an outpatient. Long acting nsulin currently held -Continue SSI  Cocaine abuse Cessation discussed on admission  Tobacco use -Continue nicotine patch   DVT  prophylaxis: Heparin subq Code Status:   Code Status: Full Code Family Communication: None Disposition Plan: Discharge pending workup for lung disease, improvement of volume status and wean to room air   Consultants:   Nephrology  Procedures:   HD  Antimicrobials:  Zosyn (6/25>>    Subjective: Some dyspnea today. Some coughing without sputum production.  Objective: Vitals:   03/05/19 2302 03/06/19 0212 03/06/19 0300 03/06/19 0400  BP:  122/83    Pulse: (!) 118  100 95  Resp:  20    Temp: (!) 101.4 F (38.6 C)     TempSrc: Oral     SpO2: 95% 100% 100% 100%  Weight:        Intake/Output Summary (Last 24 hours) at 03/06/2019 0934 Last data filed at 03/05/2019 1800 Gross per 24 hour  Intake -  Output 2500 ml  Net -2500 ml   Filed Weights   03/05/19 1800  Weight: 56.5 kg    Examination:  General exam: Appears calm and comfortable Respiratory system: Rhonchi but no rales heard today. Diminished breath soudns. Respiratory effort normal. Cardiovascular system: S1 & S2 heard, RRR. Gastrointestinal system: Abdomen is nondistended, soft and nontender. No organomegaly or masses felt. Normal bowel sounds heard. Central nervous system: Alert and oriented. No focal neurological deficits. Extremities: No edema. No calf tenderness Skin: No cyanosis. No rashes Psychiatry: Judgement and insight appear normal. Mood & affect appropriate.     Data Reviewed: I have personally reviewed following labs and imaging studies  CBC: Recent Labs  Lab 03/05/2019 1049 02/11/2019 1057 03/05/19 0539 03/05/19 1240 03/06/19 0531  WBC 12.5*  --  14.2*  --  10.6*  NEUTROABS 9.2*  --   --   --   --  HGB 7.7* 9.5* 7.2* 7.6* 6.5*  HCT 25.6* 28.0* 23.1* 25.0* 20.7*  MCV 72.5*  --  71.3*  --  70.9*  PLT 288  --  272  --  093   Basic Metabolic Panel: Recent Labs  Lab 02/22/2019 1049 02/19/2019 1057 03/05/19 0539 03/06/19 0531  NA 136 135 136 137  K 5.0 5.1 4.5 3.5  CL 100  --  99 97*   CO2 25  --  25 29  GLUCOSE 91  --  99 101*  BUN 41*  --  24* 18  CREATININE 7.67*  --  5.59* 4.38*  CALCIUM 9.1  --  8.6* 8.4*  PHOS  --   --   --  4.2   GFR: Estimated Creatinine Clearance: 9.7 mL/min (A) (by C-G formula based on SCr of 4.38 mg/dL (H)). Liver Function Tests: Recent Labs  Lab 03/06/2019 1049 03/06/19 0531  AST 43*  --   ALT 23  --   ALKPHOS 110  --   BILITOT 0.8  --   PROT 7.8  --   ALBUMIN 2.1* 2.0*   Recent Labs  Lab 03/02/2019 1049  LIPASE 46   No results for input(s): AMMONIA in the last 168 hours. Coagulation Profile: No results for input(s): INR, PROTIME in the last 168 hours. Cardiac Enzymes: No results for input(s): CKTOTAL, CKMB, CKMBINDEX, TROPONINI in the last 168 hours. BNP (last 3 results) No results for input(s): PROBNP in the last 8760 hours. HbA1C: No results for input(s): HGBA1C in the last 72 hours. CBG: Recent Labs  Lab 03/05/19 0020 03/05/19 0748 03/05/19 1235 03/05/19 2233 03/06/19 0845  GLUCAP 88 146* 177* 133* 77   Lipid Profile: No results for input(s): CHOL, HDL, LDLCALC, TRIG, CHOLHDL, LDLDIRECT in the last 72 hours. Thyroid Function Tests: No results for input(s): TSH, T4TOTAL, FREET4, T3FREE, THYROIDAB in the last 72 hours. Anemia Panel: No results for input(s): VITAMINB12, FOLATE, FERRITIN, TIBC, IRON, RETICCTPCT in the last 72 hours. Sepsis Labs: No results for input(s): PROCALCITON, LATICACIDVEN in the last 168 hours.  Recent Results (from the past 240 hour(s))  SARS Coronavirus 2 (CEPHEID - Performed in [place] hospital lab), Hosp Order     Status: None   Collection Time: 02/14/2019 12:56 PM   Specimen: Nasopharyngeal Swab  Result Value Ref Range Status   SARS Coronavirus 2 NEGATIVE NEGATIVE Final    Comment: (NOTE) If result is NEGATIVE SARS-CoV-2 target nucleic acids are NOT DETECTED. The SARS-CoV-2 RNA is generally detectable in upper and lower  respiratory specimens during the acute phase of infection.  The lowest  concentration of SARS-CoV-2 viral copies this assay can detect is 250  copies / mL. A negative result does not preclude SARS-CoV-2 infection  and should not be used as the sole basis for treatment or other  patient management decisions.  A negative result may occur with  improper specimen collection / handling, submission of specimen other  than nasopharyngeal swab, presence of viral mutation(s) within the  areas targeted by this assay, and inadequate number of viral copies  (<250 copies / mL). A negative result must be combined with clinical  observations, patient history, and epidemiological information. If result is POSITIVE SARS-CoV-2 target nucleic acids are DETECTED. The SARS-CoV-2 RNA is generally detectable in upper and lower  respiratory specimens dur ing the acute phase of infection.  Positive  results are indicative of active infection with SARS-CoV-2.  Clinical  correlation with patient history and other diagnostic information is  necessary to determine patient infection status.  Positive results do  not rule out bacterial infection or co-infection with other viruses. If result is PRESUMPTIVE POSTIVE SARS-CoV-2 nucleic acids MAY BE PRESENT.   A presumptive positive result was obtained on the submitted specimen  and confirmed on repeat testing.  While 2019 novel coronavirus  (SARS-CoV-2) nucleic acids may be present in the submitted sample  additional confirmatory testing may be necessary for epidemiological  and / or clinical management purposes  to differentiate between  SARS-CoV-2 and other Sarbecovirus currently known to infect humans.  If clinically indicated additional testing with an alternate test  methodology [phone]) is advised. The SARS-CoV-2 RNA is generally  detectable in upper and lower respiratory sp ecimens during the acute  phase of infection. The expected result is Negative. Fact Sheet for Patients:  StrictlyIdeas.no  Fact Sheet for Healthcare Providers: BankingDealers.co.za This test is not yet approved or cleared by the [place] FDA and has been authorized for detection and/or diagnosis of SARS-CoV-2 by FDA under an Emergency Use Authorization (EUA).  This EUA will remain in effect (meaning this test can be used) for the duration of the COVID-19 declaration under Section 564(b)(1) of the Act, 21 U.S.C. section 360bbb-3(b)(1), unless the authorization is terminated or revoked sooner. Performed at [facility], [place] [address] [place], [place] [address]          Radiology Studies: Dg Chest 2 View  Result Date: 03/05/2019 CLINICAL DATA:  Shortness of breath.  End-stage renal disease EXAM: CHEST - 2 VIEW COMPARISON:  March 04, 2019 FINDINGS: There is extensive airspace consolidation throughout the right lung, increased compared to 1 day prior. There is more patchy infiltrate in the left upper and lower lobes, similar to 1 day prior. There is a small left pleural effusion. There is cardiomegaly with pulmonary vascularity within normal limits. Central catheter tip is in the right atrium. No pneumothorax. There is aortic atherosclerosis. No evident adenopathy. No bone lesions. IMPRESSION: Widespread airspace consolidation which has increased throughout the right lung compared to 1 day prior. Stable airspace opacity on the left. Suspect multifocal pneumonia, potentially superimposed on a degree of pulmonary edema. There is stable cardiomegaly. There is a small left pleural effusion. Central catheter tip in right atrium. No pneumothorax. Aortic Atherosclerosis (ICD10-I70.0). Electronically Signed   By: Lowella Grip III M.D.   On: 03/05/2019 10:33   Ct Chest Wo Contrast  Result Date: 03/05/2019 CLINICAL DATA:  Acute respiratory failure, pneumonia EXAM: CT CHEST WITHOUT CONTRAST TECHNIQUE: Multidetector CT imaging of the chest was performed following the standard protocol  without IV contrast. COMPARISON:  Same-day chest radiographs, CT chest, 02/03/2019 FINDINGS: Cardiovascular: Gross cardiomegaly. Small pericardial effusion, new compared to prior examination. Large bore right chest multi lumen vascular catheter. Mediastinum/Nodes: No enlarged mediastinal, hilar, or axillary lymph nodes. Thyroid gland, trachea, and esophagus demonstrate no significant findings. Lungs/Pleura: There is dense, primarily peribronchovascular heterogeneous and consolidative airspace opacity bilaterally, worst in the right upper lobe and worsened in comparison to prior CT dated 02/03/2019. There is underlying centrilobular emphysema. Small bilateral pleural effusions. Upper Abdomen: No acute abnormality. Musculoskeletal: No chest wall mass or suspicious bone lesions identified. IMPRESSION: 1. There is dense, primarily peribronchovascular heterogeneous and consolidative airspace opacity bilaterally, worst in the right upper lobe and worsened in comparison to prior CT dated 02/03/2019. There is underlying centrilobular emphysema. Small bilateral pleural effusions. Findings are most consistent with multifocal infection although severe edema or ARDS could have this appearance. 2.  Small pericardial effusion, new compared to prior examination. Gross cardiomegaly. Electronically Signed   By: Eddie Candle M.D.   On: 03/05/2019 14:42   Ct Abdomen Pelvis W Contrast  Result Date: 02/22/2019 CLINICAL DATA:  Abdominal pain anteriorly EXAM: CT ABDOMEN AND PELVIS WITH CONTRAST TECHNIQUE: Multidetector CT imaging of the abdomen and pelvis was performed using the standard protocol following bolus administration of intravenous contrast. CONTRAST:  30mL ISOVUE-300 IOPAMIDOL (ISOVUE-300) INJECTION 61% COMPARISON:  02/05/2019, 12/03/2018, 04/20/2018 FINDINGS: Lower chest: Bilateral interstitial and alveolar airspace disease in the right middle lobe, right lower lobe, lingula and left lower lobe. Bilateral small pleural  effusions. Differential considerations include unresolved pneumonia including atypical pneumonia versus an element of pulmonary edema. New small pericardial effusion. Hepatobiliary: No focal liver abnormality is seen. Status post cholecystectomy. No biliary dilatation. Pancreas: Unremarkable. No pancreatic ductal dilatation or surrounding inflammatory changes. Spleen: Normal spleen size. 17 mm hypodense splenic mass unchanged compared with multiple prior examinations. Adrenals/Urinary Tract: Adrenal glands are unremarkable. Kidneys are normal, without renal calculi, focal lesion, or hydronephrosis. Bladder is unremarkable. Bilateral nonspecific perinephric stranding. Stomach/Bowel: Stomach is within normal limits. Appendix appears normal. No evidence of bowel wall thickening, distention, or inflammatory changes. Vascular/Lymphatic: Normal caliber abdominal aorta with mild atherosclerosis. No lymphadenopathy. Reproductive: Prior hysterectomy. 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 Other: No fluid collection or hematoma. Small amount of pelvic free fluid. Small amount of pelvic free fluid. Interval worsening anasarca. Musculoskeletal: No acute osseous abnormality. No aggressive osseous lesion. IMPRESSION: 1. No acute abdominal or pelvic pathology. 2. Interval worsening of anasarca.  Small volume pelvic free fluid. 3. Bilateral interstitial and alveolar airspace disease in the right middle lobe, right lower lobe, lingula and left lower lobe. Bilateral small pleural effusions. Differential considerations include unresolved pneumonia including atypical pneumonia versus an element of pulmonary edema. 4. New small pericardial effusion. Electronically Signed   By: Kathreen Devoid   On: 02/19/2019 12:46   Dg Chest Portable 1 View  Result Date: 02/19/2019 CLINICAL DATA:  Chest pain EXAM: PORTABLE CHEST 1 VIEW COMPARISON:  02/18/2019 FINDINGS: Cardiomegaly and vascular pedicle  widening. Diffuse interstitial coarsening. Dialysis catheter on the right with tip at the right atrium. Probable small pleural effusions. IMPRESSION: Cardiomegaly with pulmonary edema. Electronically Signed   By: Monte Fantasia M.D.   On: 02/27/2019 10:53        Scheduled Meds: . sodium chloride   Intravenous Once  . amLODipine  5 mg Oral QHS  . aspirin EC  81 mg Oral Daily  . calcitRIOL  0.25 mcg Oral Q T,Th,Sat-1800  . Chlorhexidine Gluconate Cloth  6 each Topical Q0600  . [START ON 03/12/2019] darbepoetin (ARANESP) injection - DIALYSIS  200 mcg Intravenous Q Thu-HD  . feeding supplement (PRO-STAT SUGAR FREE 64)  30 mL Oral BID  . gabapentin  100 mg Oral TID  . heparin  3,000 Units Dialysis Once in dialysis  . heparin  5,000 Units Subcutaneous Q8H  . insulin aspart  0-5 Units Subcutaneous QHS  . insulin aspart  0-9 Units Subcutaneous TID WC  . metoprolol tartrate  5 mg Intravenous Once  . nicotine  14 mg Transdermal Daily  . pantoprazole  40 mg Oral Daily  . sevelamer carbonate  1,600 mg Oral TID WC  . sodium chloride flush  3 mL Intravenous Q12H   Continuous Infusions: . piperacillin-tazobactam (ZOSYN)  IV       LOS: 1 day     Deidre Ala  Lonny Prude, MD [facility] 03/06/2019, 9:34 AM  If 7PM-7AM, please contact night-coverage [email or web address]

## 2019-03-06 NOTE — Procedures (Addendum)
Central Venous Catheter Insertion Procedure Note Rhonda Lynch [id number] 1956-01-19  Procedure: Insertion of Central Venous Catheter Indications: Assessment of intravascular volume, Drug and/or fluid administration and Frequent blood sampling  Procedure Details Consent: Unable to obtain consent because of emergent medical necessity. Time Out: Verified patient identification, verified procedure, site/side was marked, verified correct patient position, special equipment/implants available, medications/allergies/relevent history reviewed, required imaging and test results available.  Performed  Maximum sterile technique was used including antiseptics, cap, gloves, gown, hand hygiene, mask and sheet. Skin prep: Chlorhexidine; local anesthetic administered A antimicrobial bonded/coated triple lumen catheter was placed in the right internal jugular vein using the Seldinger technique.  Evaluation Blood flow good Complications: No apparent complications Patient did tolerate procedure well. Chest X-ray ordered to verify placement.  CXR: pending.  Renee Pain, MD Board Certified by the ABIM, Pulmonary Diseases & Critical Care Medicine   03/06/2019, 8:14 PM

## 2019-03-06 NOTE — Progress Notes (Signed)
Responded to Code Blue. No family contacts at this time. I offered spiritual support to PT and staff with ministry of presence, and silent prayer. Chaplain available as needed.  Chaplain Fidel Levy [phone]

## 2019-03-06 NOTE — Progress Notes (Signed)
At 1750 primary RN was called by HD RN and Rapid response RN to evaluate pt's condition in dialysis. Upon arrival, pt was noticed to be agonal breathing. Pads were placed and suction started. Unsuccessful intubation occurred, pt spontaneously breathing. Labs and xray ordered. Pt to transfer to [facility] ICU. Report handed off at bedside.

## 2019-03-06 NOTE — Significant Event (Signed)
Rapid Response Event Note  Overview:  Called by charge RN for pt refusing oxygen and care with spO2 68%. RN had paged [facility] provider and received an order for Ativan. On arrival pt sitting up in bed. Lethargic but does respond to voice. spO2 76% on RA, HR 122, BP 131/79. I was Able to help get pt back in bed with primary RN and placed on 6L [place] with spO2 improved to 98%.    Interventions: Provider notified and Ativan IV given prior to my arrival.  Pt placed on 6L   Plan of Care (if not transferred): Continue to monitor pt respiratory status and keep O2 on pt. Bedside RN and charge RN informed to call with any changes or concerns.   Event Summary:   Called at 2205      Event ended at [place]

## 2019-03-06 NOTE — Significant Event (Signed)
Rapid Response Event Note RN called for pt unresponsive  Overview: Time Called: 0813 Arrival Time: 1748 Event Type: Respiratory  Initial Focused Assessment: On arrival pt with agonal, not responding to sternal rub, sats 100%. Originally HR 90's suddenly went to 150's and sats dropped to 86%. Code blue activated. Attempted to intubate pt but pt biting the tube. Oral airway suctioned, bite block placed by RT, CRNA at bedside assisting RT with BVM. Pt placed on NRB, sats 96-100%, HR 154, BP 163/97, RR 24 Code team at bedside. See code sheet.  Interventions: CXR ABG CMP CBC Transferred to 38m01    Event Summary: Name of Physician Notified: Dr. Teryl Lucy at      at    Outcome: Transferred (Comment)(16m01)     Barbette Mcglaun, Sela Hua

## 2019-03-06 NOTE — Progress Notes (Signed)
CRITICAL VALUE ALERT  Critical Value:  Hemoglobin- 6.5  Date & Time Notied:  03/06/2019 1834  Provider Notified: [place]  Orders Received/Actions taken:per protocol, awaiting

## 2019-03-06 NOTE — Progress Notes (Signed)
Patient transferred emergently down to ICU.  Agonally breathing unresponsive.  ABG okay, CBG okay.  Intubated in ICU.  EKG with 2:1 flutter.  Will have night team evaluate but tentative plan as follows: - CXR post ETT - Likely needs central line - Stat head CT, CBC, BMP, trop - Dilt gtt - Propofol/fentanyl/PRN versed for sedation if she needs it - Consider EEG if remains altered but no obvious signs of seizures at time of eval.

## 2019-03-07 LAB — CBC
HCT: 24.3 % — ABNORMAL LOW (ref 36.0–46.0)
HCT: 24.7 % — ABNORMAL LOW (ref 36.0–46.0)
Hemoglobin: 7.5 g/dL — ABNORMAL LOW (ref 12.0–15.0)
Hemoglobin: 7.5 g/dL — ABNORMAL LOW (ref 12.0–15.0)
MCH: 22.7 pg — ABNORMAL LOW (ref 26.0–34.0)
MCH: 22.7 pg — ABNORMAL LOW (ref 26.0–34.0)
MCHC: 30.9 g/dL (ref 30.0–36.0)
MCHC: 30.4 g/dL (ref 30.0–36.0)
MCV: 73.6 fL — ABNORMAL LOW (ref 80.0–100.0)
MCV: 74.8 fL — ABNORMAL LOW (ref 80.0–100.0)
Platelets: 214 10*3/uL (ref 150–400)
Platelets: 219 10*3/uL (ref 150–400)
RBC: 3.3 MIL/uL — ABNORMAL LOW (ref 3.87–5.11)
RBC: 3.3 MIL/uL — ABNORMAL LOW (ref 3.87–5.11)
RDW: 23.5 % — ABNORMAL HIGH (ref 11.5–15.5)
RDW: 23.2 % — ABNORMAL HIGH (ref 11.5–15.5)
WBC: 10.9 10*3/uL — ABNORMAL HIGH (ref 4.0–10.5)
WBC: 10.9 10*3/uL — ABNORMAL HIGH (ref 4.0–10.5)
nRBC: 0 % (ref 0.0–0.2)
nRBC: 0 % (ref 0.0–0.2)

## 2019-03-07 LAB — RENAL FUNCTION PANEL
Albumin: 2 g/dL — ABNORMAL LOW (ref 3.5–5.0)
Anion gap: 11 (ref 5–15)
BUN: 25 mg/dL — ABNORMAL HIGH (ref 8–23)
CO2: 26 mmol/L (ref 22–32)
Calcium: 8.8 mg/dL — ABNORMAL LOW (ref 8.9–10.3)
Chloride: 100 mmol/L (ref 98–111)
Creatinine, Ser: 4.71 mg/dL — ABNORMAL HIGH (ref 0.44–1.00)
GFR calc Af Amer: 11 mL/min — ABNORMAL LOW (ref >60)
GFR calc non Af Amer: 9 mL/min — ABNORMAL LOW (ref >60)
Glucose, Bld: 144 mg/dL — ABNORMAL HIGH (ref 70–99)
Phosphorus: 5.6 mg/dL — ABNORMAL HIGH (ref 2.5–4.6)
Potassium: 3.7 mmol/L (ref 3.5–5.1)
Sodium: 137 mmol/L (ref 135–145)

## 2019-03-07 LAB — GLUCOSE, CAPILLARY
Glucose-Capillary: 136 mg/dL — ABNORMAL HIGH (ref 70–99)
Glucose-Capillary: 139 mg/dL — ABNORMAL HIGH (ref 70–99)
Glucose-Capillary: 144 mg/dL — ABNORMAL HIGH (ref 70–99)
Glucose-Capillary: 154 mg/dL — ABNORMAL HIGH (ref 70–99)
Glucose-Capillary: 192 mg/dL — ABNORMAL HIGH (ref 70–99)
Glucose-Capillary: 194 mg/dL — ABNORMAL HIGH (ref 70–99)

## 2019-03-07 LAB — TROPONIN I (HIGH SENSITIVITY): Troponin I (High Sensitivity): 72 ng/L — ABNORMAL HIGH (ref <18)

## 2019-03-07 LAB — LACTIC ACID, PLASMA: Lactic Acid, Venous: 0.9 mmol/L (ref 0.5–1.9)

## 2019-03-07 MED ORDER — VITAL HIGH PROTEIN PO LIQD: 1000.0000 mL | ORAL | Status: DC

## 2019-03-07 MED ORDER — PRO-STAT SUGAR FREE PO LIQD
30.0000 mL | Freq: Two times a day (BID) | ORAL | Status: DC
  Administered 2019-03-07 – 2019-03-13 (×12): 30 mL
  Filled 2019-03-07 (×12): qty 30

## 2019-03-07 MED ORDER — SODIUM CHLORIDE 0.9 % IV SOLN: 100.0000 mL | INTRAVENOUS | Status: DC | PRN

## 2019-03-07 MED ORDER — ALTEPLASE 2 MG IJ SOLR: 2.0000 mg | Freq: Once | INTRAMUSCULAR | Status: DC | PRN

## 2019-03-07 MED ORDER — PENTAFLUOROPROP-TETRAFLUOROETH EX AERO: 1.0000 "application " | INHALATION_SPRAY | CUTANEOUS | Status: DC | PRN

## 2019-03-07 MED ORDER — PANTOPRAZOLE SODIUM 40 MG PO PACK
40.0000 mg | PACK | Freq: Every day | ORAL | Status: DC
  Administered 2019-03-07 – 2019-03-13 (×7): 40 mg
  Filled 2019-03-07 (×7): qty 20

## 2019-03-07 MED ORDER — HEPARIN SODIUM (PORCINE) 1000 UNIT/ML DIALYSIS: 1000.0000 [IU] | INTRAMUSCULAR | Status: DC | PRN

## 2019-03-07 MED ORDER — LIDOCAINE HCL (PF) 1 % IJ SOLN: 5.0000 mL | INTRAMUSCULAR | Status: DC | PRN

## 2019-03-07 MED ORDER — SEVELAMER CARBONATE 0.8 G PO PACK
1.6000 g | PACK | Freq: Three times a day (TID) | ORAL | Status: DC
  Administered 2019-03-07 – 2019-03-13 (×20): 1.6 g
  Filled 2019-03-07 (×22): qty 2

## 2019-03-07 MED ORDER — NEPRO/CARBSTEADY PO LIQD
1000.0000 mL | ORAL | Status: DC
  Administered 2019-03-07: 1000 mL
  Filled 2019-03-07: qty 1000

## 2019-03-07 MED ORDER — DEXMEDETOMIDINE HCL IN NACL 400 MCG/100ML IV SOLN
0.4000 ug/kg/h | INTRAVENOUS | Status: DC
  Administered 2019-03-07 (×2): 0.4 ug/kg/h via INTRAVENOUS
  Filled 2019-03-07 (×2): qty 100

## 2019-03-07 MED ORDER — LIDOCAINE-PRILOCAINE 2.5-2.5 % EX CREA: 1.0000 "application " | TOPICAL_CREAM | CUTANEOUS | Status: DC | PRN

## 2019-03-07 MED ORDER — METHYLPREDNISOLONE SODIUM SUCC 40 MG IJ SOLR
40.0000 mg | Freq: Two times a day (BID) | INTRAMUSCULAR | Status: DC
  Administered 2019-03-07 – 2019-03-10 (×7): 40 mg via INTRAVENOUS
  Filled 2019-03-07 (×7): qty 1

## 2019-03-07 MED FILL — Medication: Qty: 1 | Status: AC

## 2019-03-07 NOTE — Progress Notes (Signed)
[facility] Progress Note   Dialysis Orders: TTS [place] 4h 53.5kg 2/2.5 bath (R AVG) / TDC Hep 3000 - mircera 225 q 2wks (last 6/18) - venofer 50/wk - hect 4ug tiw  Assessment/Plan: 1. SOB/ pulm consolidation by CT/ acute on chron resp failure: seen by CCM suspecting cocaine lung. Now intubated in ICU.  2. Abd pain/ chest pain: trop negative 3. ESRD- TTS HD. Off schedule. [place] (5/11)hadfirst use here on 6/11. S/p 6/12 IR declot of SVG.AVG worked well last night. Can remove TDC while here. Had HD x 3 , next HD Monday 4. HTN/ volume - on norvasc 10 at home; no BP meds here , BP's good. No vol excess, at dry wt.  5. Anemia ckd/ phlebotomy - Hb 7.5, transfuse prn, max dose ESA due 7/2, repeat Fe stores 6. Nutrition- renal carb mod/vit alb 2 - very poor - add prostat - also has nepro 7. DM 2 on insulin 8. Hx CVA 9. Hep C+ 10. Polysubstance abuse  Kelly Splinter  MD 03/07/2019, 9:50 AM    Subjective:   Intubated in ICU now, had HD yesterday. At dry wt today  Objective Vitals:   03/07/19 0630 03/07/19 0645 03/07/19 0704 03/07/19 0742  BP: 122/64 121/71  128/69  Pulse:    75  Resp:    (!) 21  Temp:   98.5 F (36.9 C)   TempSrc:   Oral   SpO2:    100%  Weight:       Physical Exam General: intubated, sedated Heart: RRR Lungs: occ scattered rhonchi Abdomen: soft ntnd no ascites Extremities: no LE or hip edema Dialysis Access: right AVGG + bruit, R [facility]   Additional Objective Labs: Basic Metabolic Panel: Recent Labs  Lab 03/06/19 0531 03/06/19 1820 03/06/19 1901 03/06/19 2017 03/07/19 0326  NA 137 136 134* 137 137  K 3.5 4.0 3.7 4.2 3.7  CL 97* 99 98  --  100  CO2 _0 --  26  GLUCOSE 101* 201* 253*  --  144*  BUN _1 --  25*  CREATININE 4.38* 3.59* 3.77*  --  4.71*  CALCIUM 8.4* 8.5* 8.3*  --  8.8*  PHOS 4.2  --   --   --  5.6*   Liver Function Tests: Recent Labs  Lab 03/09/2019 1049 03/06/19 0531 03/07/19 0326   AST 43*  --   --   ALT 23  --   --   ALKPHOS 110  --   --   BILITOT 0.8  --   --   PROT 7.8  --   --   ALBUMIN 2.1* 2.0* 2.0*   Recent Labs  Lab 02/10/2019 1049  LIPASE 46   CBC: Recent Labs  Lab 02/24/2019 1049  03/05/19 0539  03/06/19 0531 03/06/19 1820 03/06/19 1901 03/06/19 2017 03/07/19 0326  WBC 12.5*  --  14.2*  --  10.6* 15.2* 16.0*  --  10.9*  NEUTROABS 9.2*  --   --   --   --   --   --   --   --   HGB 7.7*   < > 7.2*   < > 6.5* 8.7* 8.5* 9.9* 7.5*  HCT 25.6*   < > 23.1*   < > 20.7* 28.2* 27.4* 29.0* 24.3*  MCV 72.5*  --  71.3*  --  70.9* 73.6* 73.5*  --  73.6*  PLT 288  --  272  --  243 306 241  --  214   < > = values in this interval not displayed.   Blood Culture    Component Value Date/Time   SDES TRACHEAL ASPIRATE 03/06/2019 2021   SPECREQUEST NONE 03/06/2019 2021   CULT  03/06/2019 2021    CULTURE REINCUBATED FOR BETTER GROWTH Performed at [facility], [place] [address], [place],  [address]    REPTSTATUS PENDING 03/06/2019 2021    Cardiac Enzymes: No results for input(s): CKTOTAL, CKMB, CKMBINDEX, TROPONINI in the last 168 hours. CBG: Recent Labs  Lab 03/06/19 1839 03/06/19 2141 03/06/19 2320 03/07/19 0315 03/07/19 0703  GLUCAP 220* 254* 252* 136* 144*   Iron Studies: No results for input(s): IRON, TIBC, TRANSFERRIN, FERRITIN in the last 72 hours. Lab Results  Component Value Date   INR 1.1 02/20/2019   INR 1.1 01/19/2019   INR 1.0 01/14/2019   Studies/Results: Ct Head Wo Contrast  Result Date: 03/06/2019 CLINICAL DATA:  Altered mental status EXAM: CT HEAD WITHOUT CONTRAST TECHNIQUE: Contiguous axial images were obtained from the base of the skull through the vertex without intravenous contrast. COMPARISON:  None. FINDINGS: Brain: No evidence of acute infarction, hemorrhage, hydrocephalus, extra-axial collection or mass lesion/mass effect. Periventricular white matter hypodensity. Vascular: No hyperdense vessel or unexpected  calcification. Skull: Normal. Negative for fracture or focal lesion. Sinuses/Orbits: Fluid within the nasal cavity and sinuses, nonspecific in the setting of intubation. Other: None. IMPRESSION: No acute intracranial pathology.  Small-vessel white matter disease. Electronically Signed   By: Eddie Candle M.D.   On: 03/06/2019 21:19   Ct Chest Wo Contrast  Result Date: 03/05/2019 CLINICAL DATA:  Acute respiratory failure, pneumonia EXAM: CT CHEST WITHOUT CONTRAST TECHNIQUE: Multidetector CT imaging of the chest was performed following the standard protocol without IV contrast. COMPARISON:  Same-day chest radiographs, CT chest, 02/03/2019 FINDINGS: Cardiovascular: Gross cardiomegaly. Small pericardial effusion, new compared to prior examination. Large bore right chest multi lumen vascular catheter. Mediastinum/Nodes: No enlarged mediastinal, hilar, or axillary lymph nodes. Thyroid gland, trachea, and esophagus demonstrate no significant findings. Lungs/Pleura: There is dense, primarily peribronchovascular heterogeneous and consolidative airspace opacity bilaterally, worst in the right upper lobe and worsened in comparison to prior CT dated 02/03/2019. There is underlying centrilobular emphysema. Small bilateral pleural effusions. Upper Abdomen: No acute abnormality. Musculoskeletal: No chest wall mass or suspicious bone lesions identified. IMPRESSION: 1. There is dense, primarily peribronchovascular heterogeneous and consolidative airspace opacity bilaterally, worst in the right upper lobe and worsened in comparison to prior CT dated 02/03/2019. There is underlying centrilobular emphysema. Small bilateral pleural effusions. Findings are most consistent with multifocal infection although severe edema or ARDS could have this appearance. 2. Small pericardial effusion, new compared to prior examination. Gross cardiomegaly. Electronically Signed   By: Eddie Candle M.D.   On: 03/05/2019 14:42   Portable Chest  X-ray  Result Date: 03/06/2019 CLINICAL DATA:  Intubated. EXAM: PORTABLE CHEST 1 VIEW COMPARISON:  X-ray from the same day FINDINGS: The endotracheal tube terminates approximately 4.8 cm above the carina. There is a newly placed right-sided central venous catheter that appears well position near the distal SVC/cavoatrial junction. The tunneled dialysis catheter on the right is stable. The heart remains enlarged. Multifocal airspace opacities are again noted. Aortic calcifications are again seen. The previously noted lucency projecting over the left mediastinum has resolved. The enteric tube extends below the left hemidiaphragm. IMPRESSION: 1. Lines and tubes as above.  No pneumothorax. 2. Persistent multifocal airspace opacities. 3. Cardiomegaly. 4. Previously noted linear lucency overlying the  left mediastinum has resolved and was likely artifact. Electronically Signed   By: Constance Holster M.D.   On: 03/06/2019 20:35   Dg Chest Port 1 View  Result Date: 03/06/2019 CLINICAL DATA:  Aspiration EXAM: PORTABLE CHEST 1 VIEW COMPARISON:  March 05, 2019 FINDINGS: The heart size is significantly enlarged. An endotracheal tube is not reliably visualized on this exam. There is a well-positioned tunneled dialysis catheter on the right. There are dense bilateral airspace opacities. There are small bilateral pleural effusions. There is no pneumothorax. The lung fields are partially obscured by an overlying being defibrillator pad. There is a thin linear density projecting over the left mediastinum. IMPRESSION: 1. Lines and tubes as above. 2. Persistent diffuse multifocal airspace opacities, likely not significantly changed from prior CT chest. 3. Linear lucency projecting over the left mediastinum is favored to represent artifact, less likely pneumomediastinum. Attention on follow-up examinations versus a short interval repeat chest x-ray is recommended. Electronically Signed   By: Constance Holster M.D.   On:  03/06/2019 19:11   Dg Abd Portable 1v  Result Date: 03/06/2019 CLINICAL DATA:  OG tube placement EXAM: PORTABLE ABDOMEN - 1 VIEW COMPARISON:  None. FINDINGS: The OG tube projects over the gastric body. The tube is coiled once. The bowel gas pattern is nonspecific and nonobstructive. Calcifications project over the patient's pelvis. IMPRESSION: OG tube projects over the patient's gastric body. Electronically Signed   By: Constance Holster M.D.   On: 03/06/2019 20:36   Medications: . dexmedetomidine (PRECEDEX) IV infusion Stopped (03/07/19 0851)  . fentaNYL infusion INTRAVENOUS 0 mcg/hr (03/07/19 0851)  . piperacillin-tazobactam (ZOSYN)  IV 3.375 g (03/07/19 0806)   . sodium chloride   Intravenous Once  . aspirin EC  81 mg Oral Daily  . calcitRIOL  0.25 mcg Oral Q T,Th,Sat-1800  . chlorhexidine gluconate (MEDLINE KIT)  15 mL Mouth Rinse BID  . Chlorhexidine Gluconate Cloth  6 each Topical Q0600  . [START ON 03/12/2019] darbepoetin (ARANESP) injection - DIALYSIS  200 mcg Intravenous Q Thu-HD  . feeding supplement (PRO-STAT SUGAR FREE 64)  30 mL Oral BID  . gabapentin  100 mg Oral TID  . heparin  5,000 Units Subcutaneous Q8H  . insulin aspart  2-6 Units Subcutaneous Q4H  . mouth rinse  15 mL Mouth Rinse 10 times per day  . methylPREDNISolone (SOLU-MEDROL) injection  40 mg Intravenous Q12H  . nicotine  14 mg Transdermal Daily  . pantoprazole sodium  40 mg Per Tube Daily  . sevelamer carbonate  1,600 mg Oral TID WC  . sodium chloride flush  3 mL Intravenous Q12H

## 2019-03-07 NOTE — Progress Notes (Signed)
CRITICAL VALUE ALERT  Critical Value: Troponin 72  Date & Time Notied:  03/07/19 0513  Provider Notified: Warren Lacy  Orders Received/Actions taken: see orders

## 2019-03-07 NOTE — Progress Notes (Signed)
Name: Rhonda Lynch MRN: [id number] DOB: [birth year]    ADMISSION DATE:  03/03/2019 CONSULTATION DATE: 03/06/2019  REFERRING MD : [facility]  CHIEF COMPLAINT: Acute on chronic hypoxia  BRIEF PATIENT DESCRIPTION: 64 yo FM, ESRD, chronic cocaine use, tobacco use, admitted for hypoxic respiratory failure due to BL pna, CT imaging with peri-bronchovascular infiltrates, proximal perlymphatic and perihilar distribution with intralobular septal thickening and BL GGO.  On review, infiltrates dating back to 11/2018 on CT scans, CT abdomen from 10/2018 shows clear lungs Had respiratory arrest 6/26  SIGNIFICANT EVENTS  03/05/2019 hypoxic event 6/26 respiratory arrest on dialysis, did not lose pulse  STUDIES:  03/05/2019 CT scan again shows bilateral airspace disease and cardiomegaly 6/26 head CT neg    ETT 6/26 >> RIJ 6/26 >>  PAST MEDICAL HISTORY :   has a past medical history of Diabetes mellitus without complication ([place]), ESRD (end stage renal disease) on dialysis ([place]), Hepatitis C, Hypertension, Oxygen deficiency, Stroke ([place]), and Substance abuse ([place]).  has a past surgical history that includes IR Fluoro Guide CV Line Right (02/18/2018); IR US Guide Vasc Access Right (02/18/2018); IR Fluoro Guide CV Line Right (02/26/2018); AV fistula placement (Left, 03/05/2018); Hematoma evacuation (Left, 03/06/2018); IR Removal Tun Cv Cath W/O FL (04/27/2018); TEE without cardioversion (N/A, 04/29/2018); Insertion of dialysis catheter (Left, 04/29/2018); AV fistula placement (Left, 07/21/2018); Bascilic vein transposition (Left, 09/29/2018); I&D extremity (Left, 10/06/2018); venogram (Left, 10/27/2018); AV fistula placement (Left, 10/27/2018); Thrombectomy and revision of arterioventous (av) goretex  graft (Left, 12/11/2018); IR US Guide Vasc Access Right (01/14/2019); IR Fluoro Guide CV Line Right (01/14/2019); Insertion of dialysis catheter (Right, 01/19/2019); AV fistula placement (Right, 01/19/2019); IR THROMBECTOMY AV FISTULA  W/THROMBOLYSIS INC/SHUNT/IMG RIGHT (02/20/2019); and IR US Guide Vasc Access Right (02/20/2019).    SUBJECTIVE:  Sedated on fentanyl drip, afebrile,    VITAL SIGNS: Temp:  [97.9 F (36.6 C)-98.6 F (37 C)] 98.5 F (36.9 C) (06/27 0704) Pulse Rate:  [70-159] 75 (06/27 0742) Resp:  [13-21] 21 (06/27 0742) BP: (100-177)/(64-115) 128/69 (06/27 0742) SpO2:  [100 %] 100 % (06/27 0742) FiO2 (%):  [40 %-100 %] 40 % (06/27 0742) Weight:  [53.4 kg-57.2 kg] 53.4 kg (06/27 0255)  PHYSICAL EXAMINATION: General: Tonically ill-appearing, orally intubated and sedated Neuro: Does not follow commands, RA SS -2 HEENT: No JVD or lymphadenopathy is appreciated.  Dry oral mucosa. Cardiovascular: Heart sounds are regular Lungs: Decreased in bases, bilateral scattered rhonchi Abdomen: Soft, nontender, hypoactive bowel sounds Musculoskeletal: Intact Skin: Plus edema extremity  X-ray 6/26 personally reviewed which shows bilateral multifocal patchy infiltrates and ET tube in position   Acute on chronic hypoxic respiratory failure  -  Bilateral infiltrates/diffuse lung disease -dates back to 11/2018, attributed to chronic lung, drug screen was positive for cocaine in 11/2018 not checked this admit Differential includes ILD less likely and pulmonary edema due to Volume overload  -Start spontaneous breathing trials -Empiric Solu-Medrol 40 every 12, monitor sugars -Check urine drug screen -ct zosyn, await resp cx  Acute systolic/diastolic heart failure Echo 01/2019 shows EF 45%, RVSP 49 Elevated troponin -Aspirin daily, consider hydralazine/ nitrates  Acute encephalopathy with history of CVA  -Minimize fentanyl, use Precedex, goal RA SS 0 to -1  Diabetes, insulin-requiring -SSI  -If she needs tube feeds, will add Lantus    The patient is critically ill with multiple organ systems failure and requires high complexity decision making for assessment and support, frequent evaluation and titration of  therapies, application of advanced monitoring technologies  and extensive interpretation of multiple databases. Critical Care Time devoted to patient care services described in this note independent of APP/resident  time is 35 minutes.   Kara Mead MD. Shade Flood.  Pulmonary & Critical care Pager [phone] If no response call [phone]    03/07/2019, 7:58 AM

## 2019-03-07 NOTE — Progress Notes (Signed)
Initial Nutrition Assessment   RD working remotely.  DOCUMENTATION CODES:   Not applicable  INTERVENTION:  Initiate trickle tube feeds using Nepro formula at 20 ml/hr via NGT.  Once able to advance past trickle rate, increase tube feeding to new goal rate of 30 ml/hr (720 ml per day).  Continue 30 ml Prostat BID.   Tube feeding regimen to provide 1496 kcal, 88 grams of protein, 526 ml free water.   NUTRITION DIAGNOSIS:   Inadequate oral intake related to inability to eat as evidenced by NPO status.  GOAL:   Patient will meet greater than or equal to 90% of their needs  MONITOR:   TF tolerance, Labs, Weight trends, Skin, Vent status, I & O's  REASON FOR ASSESSMENT:   Consult Enteral/tube feeding initiation and management  ASSESSMENT:   63 yo FM, ESRD on HD, DM, CHF, chronic cocaine use, tobacco use, admitted for volume overload, hypoxic respiratory failure due to BL pna, CT imaging with peri-bronchovascular infiltrates, proximal perlymphatic and perihilar distribution with intralobular septal thickening and BL GGO.  Patient is currently intubated on ventilator support MV: 5.8 L/min Temp (24hrs), Avg:98.3 F (36.8 C), Min:97.8 F (36.6 C), Max:98.6 F (37 C)  Propofol: none  Pt with respiratory arrest on dialysis yesterday. Per MD, plans to start trickle tube feeds. Recommendations for goal tube feeding rate started above once able to advance past trickle rate. Noted 30 ml Prostat BID has been ordered and RN has been administering. Per Nephrology MD, pt currently at dry weight.   Unable to complete Nutrition-Focused physical exam at this time.   Labs and medications reviewed. Phosphorous elevated at 5.6.  Diet Order:   Diet Order            Diet NPO time specified  Diet effective now              EDUCATION NEEDS:   Not appropriate for education at this time  Skin:  Skin Assessment: Reviewed RN Assessment  Last BM:  Unknown  Height:   Ht Readings  from Last 1 Encounters:  02/18/19 5\' 1"  (1.549 m)    Weight:   Wt Readings from Last 1 Encounters:  03/07/19 53.4 kg  Net I/O: -3.7L since admission.  Ideal Body Weight:  47.7 kg  BMI:  Body mass index is 22.24 kg/m.  Estimated Nutritional Needs:   Kcal:  1200-1500  Protein:  85-100 grams  Fluid:  1 L + UOP   Corrin Parker, MS, RD, LDN Pager # [phone] After hours/ weekend pager # [phone]

## 2019-03-08 ENCOUNTER — Inpatient Hospital Stay (HOSPITAL_COMMUNITY): Payer: Medicare Other

## 2019-03-08 LAB — CBC
HCT: 25.2 % — ABNORMAL LOW (ref 36.0–46.0)
Hemoglobin: 7.8 g/dL — ABNORMAL LOW (ref 12.0–15.0)
MCH: 22.7 pg — ABNORMAL LOW (ref 26.0–34.0)
MCHC: 31 g/dL (ref 30.0–36.0)
MCV: 73.5 fL — ABNORMAL LOW (ref 80.0–100.0)
Platelets: 242 10*3/uL (ref 150–400)
RBC: 3.43 MIL/uL — ABNORMAL LOW (ref 3.87–5.11)
RDW: 23.9 % — ABNORMAL HIGH (ref 11.5–15.5)
WBC: 11.2 10*3/uL — ABNORMAL HIGH (ref 4.0–10.5)
nRBC: 0.2 % (ref 0.0–0.2)

## 2019-03-08 LAB — GLUCOSE, CAPILLARY
Glucose-Capillary: 217 mg/dL — ABNORMAL HIGH (ref 70–99)
Glucose-Capillary: 159 mg/dL — ABNORMAL HIGH (ref 70–99)
Glucose-Capillary: 155 mg/dL — ABNORMAL HIGH (ref 70–99)
Glucose-Capillary: 198 mg/dL — ABNORMAL HIGH (ref 70–99)
Glucose-Capillary: 157 mg/dL — ABNORMAL HIGH (ref 70–99)

## 2019-03-08 LAB — BASIC METABOLIC PANEL
Anion gap: 13 (ref 5–15)
BUN: 59 mg/dL — ABNORMAL HIGH (ref 8–23)
CO2: 26 mmol/L (ref 22–32)
Calcium: 8.9 mg/dL (ref 8.9–10.3)
Chloride: 96 mmol/L — ABNORMAL LOW (ref 98–111)
Creatinine, Ser: 6.48 mg/dL — ABNORMAL HIGH (ref 0.44–1.00)
GFR calc Af Amer: 7 mL/min — ABNORMAL LOW (ref >60)
GFR calc non Af Amer: 6 mL/min — ABNORMAL LOW (ref >60)
Glucose, Bld: 209 mg/dL — ABNORMAL HIGH (ref 70–99)
Potassium: 4.7 mmol/L (ref 3.5–5.1)
Sodium: 135 mmol/L (ref 135–145)

## 2019-03-08 LAB — PROCALCITONIN: Procalcitonin: 2.87 ng/mL

## 2019-03-08 MED ORDER — INSULIN GLARGINE 100 UNIT/ML ~~LOC~~ SOLN
5.0000 [IU] | Freq: Every day | SUBCUTANEOUS | Status: DC
  Administered 2019-03-08: 5 [IU] via SUBCUTANEOUS
  Filled 2019-03-08 (×2): qty 0.05

## 2019-03-08 MED ORDER — INSULIN ASPART 100 UNIT/ML ~~LOC~~ SOLN
0.0000 [IU] | SUBCUTANEOUS | Status: DC
  Administered 2019-03-08 (×3): 3 [IU] via SUBCUTANEOUS
  Administered 2019-03-08: 5 [IU] via SUBCUTANEOUS
  Administered 2019-03-08 (×2): 3 [IU] via SUBCUTANEOUS
  Administered 2019-03-09: 5 [IU] via SUBCUTANEOUS
  Administered 2019-03-09: 3 [IU] via SUBCUTANEOUS
  Administered 2019-03-09: 5 [IU] via SUBCUTANEOUS
  Administered 2019-03-09 (×2): 2 [IU] via SUBCUTANEOUS
  Administered 2019-03-09: 5 [IU] via SUBCUTANEOUS
  Administered 2019-03-10 – 2019-03-11 (×7): 3 [IU] via SUBCUTANEOUS
  Administered 2019-03-11: 8 [IU] via SUBCUTANEOUS
  Administered 2019-03-11 – 2019-03-12 (×2): 3 [IU] via SUBCUTANEOUS
  Administered 2019-03-12: 2 [IU] via SUBCUTANEOUS
  Administered 2019-03-12: 3 [IU] via SUBCUTANEOUS
  Administered 2019-03-12: 8 [IU] via SUBCUTANEOUS
  Administered 2019-03-13: 3 [IU] via SUBCUTANEOUS
  Administered 2019-03-13: 2 [IU] via SUBCUTANEOUS
  Administered 2019-03-13: 3 [IU] via SUBCUTANEOUS
  Administered 2019-03-13: 09:00:00 2 [IU] via SUBCUTANEOUS

## 2019-03-08 MED ORDER — CHLORHEXIDINE GLUCONATE CLOTH 2 % EX PADS
6.0000 | MEDICATED_PAD | Freq: Every day | CUTANEOUS | Status: DC
  Administered 2019-03-08 – 2019-03-09 (×2): 6 via TOPICAL

## 2019-03-08 MED ORDER — DARBEPOETIN ALFA 200 MCG/0.4ML IJ SOSY: 200.0000 ug | PREFILLED_SYRINGE | INTRAMUSCULAR | Status: DC

## 2019-03-08 MED ORDER — ISOSORBIDE DINITRATE 10 MG PO TABS
10.0000 mg | ORAL_TABLET | Freq: Two times a day (BID) | ORAL | Status: DC
  Administered 2019-03-08 (×2): 10 mg via ORAL
  Filled 2019-03-08 (×3): qty 1

## 2019-03-08 MED ORDER — NEPRO/CARBSTEADY PO LIQD
1000.0000 mL | ORAL | Status: DC
  Administered 2019-03-08 – 2019-03-13 (×6): 1000 mL
  Filled 2019-03-08 (×6): qty 1000

## 2019-03-08 MED ORDER — HYDRALAZINE HCL 25 MG PO TABS
25.0000 mg | ORAL_TABLET | Freq: Three times a day (TID) | ORAL | Status: DC
  Administered 2019-03-08 (×3): 25 mg
  Filled 2019-03-08 (×4): qty 1

## 2019-03-08 NOTE — Progress Notes (Signed)
Name: Rhonda Lynch MRN: [id number] DOB: 1956-07-10    ADMISSION DATE:  02/17/2019 CONSULTATION DATE: 03/06/2019  REFERRING MD : [facility]  CHIEF COMPLAINT: Acute on chronic hypoxia  BRIEF PATIENT DESCRIPTION: 63 yo FM, ESRD, chronic cocaine use, tobacco use, admitted for hypoxic respiratory failure due to BL pna, CT imaging with peri-bronchovascular infiltrates, proximal perlymphatic and perihilar distribution with intralobular septal thickening and BL GGO.  On review, infiltrates dating back to 11/2018 on CT scans, CT abdomen from 10/2018 shows clear lungs Had respiratory arrest 6/26  SIGNIFICANT EVENTS  03/05/2019 hypoxic event 6/26 respiratory arrest on dialysis, did not lose pulse  STUDIES:  03/05/2019 CT scan again shows bilateral airspace disease and cardiomegaly 6/26 head CT neg    ETT 6/26 >> RIJ 6/26 >>   resp cx 6/26 > neg [facility] 6/26 >> neg   6/26 zosyn >>  PAST MEDICAL HISTORY :   has a past medical history of Diabetes mellitus without complication ([place]), ESRD (end stage renal disease) on dialysis ([place]), Hepatitis C, Hypertension, Oxygen deficiency, Stroke ([place]), and Substance abuse ([place]).  has a past surgical history that includes IR Fluoro Guide CV Line Right (02/18/2018); IR US Guide Vasc Access Right (02/18/2018); IR Fluoro Guide CV Line Right (02/26/2018); AV fistula placement (Left, 03/05/2018); Hematoma evacuation (Left, 03/06/2018); IR Removal Tun Cv Cath W/O FL (04/27/2018); TEE without cardioversion (N/A, 04/29/2018); Insertion of dialysis catheter (Left, 04/29/2018); AV fistula placement (Left, 07/21/2018); Bascilic vein transposition (Left, 09/29/2018); I&D extremity (Left, 10/06/2018); venogram (Left, 10/27/2018); AV fistula placement (Left, 10/27/2018); Thrombectomy and revision of arterioventous (av) goretex  graft (Left, 12/11/2018); IR US Guide Vasc Access Right (01/14/2019); IR Fluoro Guide CV Line Right (01/14/2019); Insertion of dialysis catheter (Right, 01/19/2019); AV  fistula placement (Right, 01/19/2019); IR THROMBECTOMY AV FISTULA W/THROMBOLYSIS INC/SHUNT/IMG RIGHT (02/20/2019); and IR US Guide Vasc Access Right (02/20/2019).    SUBJECTIVE:   No events overnight Remains critically ill, intubated, on low-dose Precedex drip    VITAL SIGNS: Temp:  [97.4 F (36.3 C)-98.1 F (36.7 C)] 97.5 F (36.4 C) (06/28 0701) Pulse Rate:  [49-81] 54 (06/28 0724) Resp:  [13-19] 13 (06/28 0724) BP: (114-162)/(67-93) 137/77 (06/28 0700) SpO2:  [100 %] 100 % (06/28 0724) FiO2 (%):  [40 %] 40 % (06/28 0724) Weight:  [55.3 kg] 55.3 kg (06/28 0409)  PHYSICAL EXAMINATION: General: Chronically ill-appearing, orally intubated  Neuro:  follows one-step commands, RA SS -1 HEENT: No JVD or lymphadenopathy is appreciated.  Dry oral mucosa. Cardiovascular: Heart sounds are regular Lungs: Decreased in bases, bilateral scattered rhonchi Abdomen: Soft, nontender, hypoactive bowel sounds Musculoskeletal: Intact Skin: 1+ edema extremity  CX-ray 6/28 personally reviewed which shows improved aeration on left but persistent patchy infiltrates on right   Acute on chronic hypoxic respiratory failure  -  Bilateral infiltrates/diffuse lung disease -dates back to 11/2018, attributed to chronic lung, drug screen was positive for cocaine in 11/2018 not checked this admit Differential includes ILD less likely and pulmonary edema due to Volume overload  -Start spontaneous breathing trials -tolerates pressure support 12/5 with low tidal volumes -ct Empiric Solu-Medrol 40 every 12, monitor sugars -Check urine drug screen -ct zosyn,  resp cx neg, obtain procalcitonin if negative limit to 5 days  Acute systolic/diastolic heart failure Echo 01/2019 shows EF 45%, RVSP 49 Elevated troponin -Aspirin daily, add hydralazine/ nitrates  Acute encephalopathy with history of CVA  -Minimize fentanyl, use Precedex, goal RA SS 0 to -1  Diabetes, insulin-requiring -SSI  -will add Lantus 5  U    The patient is critically ill with multiple organ systems failure and requires high complexity decision making for assessment and support, frequent evaluation and titration of therapies, application of advanced monitoring technologies and extensive interpretation of multiple databases. Critical Care Time devoted to patient care services described in this note independent of APP/resident  time is 32 minutes.     Kara Mead MD. Shade Flood. [place] Pulmonary & Critical care Pager [phone] If no response call [phone]    03/08/2019, 8:46 AM

## 2019-03-08 NOTE — Progress Notes (Signed)
[facility] Progress Note   Dialysis Orders: TTS [place] 4h 53.5kg 2/2.5 bath (R AVG) / TDC Hep 3000 - mircera 225 q 2wks (last 6/18) - venofer 50/wk - hect 4ug tiw  Assessment/Plan: 1. SOB/ pulm consolidation by CT/ acute on chron resp failure: intubated in ICU, started on empiric IV steroids and zosyn,  per CCM. 2. Abd pain/ chest pain: trop negative 3. ESRD- TTS HD. Off schedule. [facility] (5/11)hadfirst use here on 6/11. S/p 6/12 IR declot of SVG.AVG working here, can remove TDC while here. Last HD Friday, next HD Monday off schedule.  4. HTN/ volume - on norvasc 10 at home; no BP meds here , BP's good. No vol excess on exam, up 1-2kg 5. Anemia ckd/ phlebotomy - Hb 7.5, transfuse prn,  ESA due 7/2 have ordered, repeat Fe stores 6. Nutrition- renal carb mod/vit alb 2 - very poor - add prostat - also has nepro 7. DM 2 on insulin 8. Hx CVA 9. Hep C+ 10. Polysubstance abuse  Kelly Splinter  MD 03/08/2019, 9:11 AM    Subjective:   On vent, no new changes  Objective Vitals:   03/08/19 0724 03/08/19 0730 03/08/19 0800 03/08/19 0830  BP:  140/80 133/71 139/80  Pulse: (!) 54 (!) 53 (!) 57 (!) 57  Resp: '13 18 19 17  ' Temp:      TempSrc:      SpO2: 100% 100% 100% 100%  Weight:       Physical Exam General: intubated, sedated Heart: RRR Lungs: occ scattered rhonchi Abdomen: soft ntnd no ascites Extremities: no LE or hip edema Dialysis Access: right AVGG + bruit, R [facility]   Additional Objective Labs: Basic Metabolic Panel: Recent Labs  Lab 03/06/19 0531  03/06/19 1901 03/06/19 2017 03/07/19 0326 03/08/19 0410  NA 137   < > 134* 137 137 135  K 3.5   < > 3.7 4.2 3.7 4.7  CL 97*   < > 98  --  100 96*  CO2 29   < > 25  --  26 26  GLUCOSE 101*   < > 253*  --  144* 209*  BUN 18   < > 18  --  25* 59*  CREATININE 4.38*   < > 3.77*  --  4.71* 6.48*  CALCIUM 8.4*   < > 8.3*  --  8.8* 8.9  PHOS 4.2  --   --   --  5.6*  --    < > = values in this  interval not displayed.   Liver Function Tests: Recent Labs  Lab 02/19/2019 1049 03/06/19 0531 03/07/19 0326  AST 43*  --   --   ALT 23  --   --   ALKPHOS 110  --   --   BILITOT 0.8  --   --   PROT 7.8  --   --   ALBUMIN 2.1* 2.0* 2.0*   Recent Labs  Lab 03/09/2019 1049  LIPASE 46   CBC: Recent Labs  Lab 03/08/2019 1049  03/06/19 1820 03/06/19 1901  03/07/19 0326 03/07/19 1925 03/08/19 0410  WBC 12.5*   < > 15.2* 16.0*  --  10.9* 10.9* 11.2*  NEUTROABS 9.2*  --   --   --   --   --   --   --   HGB 7.7*   < > 8.7* 8.5*   < > 7.5* 7.5* 7.8*  HCT 25.6*   < > 28.2* 27.4*   < >  24.3* 24.7* 25.2*  MCV 72.5*   < > 73.6* 73.5*  --  73.6* 74.8* 73.5*  PLT 288   < > 306 241  --  214 219 242   < > = values in this interval not displayed.   Blood Culture    Component Value Date/Time   SDES TRACHEAL ASPIRATE 03/06/2019 2021   SPECREQUEST NONE 03/06/2019 2021   CULT  03/06/2019 2021    FEW Consistent with normal respiratory flora. Performed at [facility], [place] [address] [place], [place] [address]    REPTSTATUS PENDING 03/06/2019 2021    Cardiac Enzymes: No results for input(s): CKTOTAL, CKMB, CKMBINDEX, TROPONINI in the last 168 hours. CBG: Recent Labs  Lab 03/07/19 1503 03/07/19 1930 03/07/19 2338 03/08/19 0354 03/08/19 0700  GLUCAP 154* 194* 192* 217* 159*   Iron Studies: No results for input(s): IRON, TIBC, TRANSFERRIN, FERRITIN in the last 72 hours. Lab Results  Component Value Date   INR 1.1 02/20/2019   INR 1.1 01/19/2019   INR 1.0 01/14/2019   Studies/Results: Ct Head Wo Contrast  Result Date: 03/06/2019 CLINICAL DATA:  Altered mental status EXAM: CT HEAD WITHOUT CONTRAST TECHNIQUE: Contiguous axial images were obtained from the base of the skull through the vertex without intravenous contrast. COMPARISON:  None. FINDINGS: Brain: No evidence of acute infarction, hemorrhage, hydrocephalus, extra-axial collection or mass lesion/mass effect.  Periventricular white matter hypodensity. Vascular: No hyperdense vessel or unexpected calcification. Skull: Normal. Negative for fracture or focal lesion. Sinuses/Orbits: Fluid within the nasal cavity and sinuses, nonspecific in the setting of intubation. Other: None. IMPRESSION: No acute intracranial pathology.  Small-vessel white matter disease. Electronically Signed   By: Eddie Candle M.D.   On: 03/06/2019 21:19   Dg Chest Port 1 View  Result Date: 03/08/2019 CLINICAL DATA:  Acute respiratory failure EXAM: PORTABLE CHEST 1 VIEW COMPARISON:  Chest x-rays dated 03/06/2019 and 03/05/2019 FINDINGS: Endotracheal tube is well positioned with tip just above the level of the carina. RIGHT IJ and RIGHT subclavian central catheter is appear well positioned with tips overlying the RIGHT atrium. Enteric tube passes below the diaphragm. Stable cardiomegaly. Stable mild bilateral interstitial prominence. Asymmetric opacities throughout the RIGHT lung, slightly more prominent than on yesterday's chest x-ray and similar to appearance on earlier exam of 03/05/2019, multifocal pneumonia versus asymmetric edema. No pleural effusion or pneumothorax seen. IMPRESSION: 1. Endotracheal tube well positioned with tip just above the level of the carina. Additional support apparatus appears stable. 2. Stable cardiomegaly. 3. Asymmetric opacities throughout the RIGHT lung, similar to appearance on chest x-ray of 03/05/2019, multifocal pneumonia versus asymmetric edema. Electronically Signed   By: Franki Cabot M.D.   On: 03/08/2019 05:30   Portable Chest X-ray  Result Date: 03/06/2019 CLINICAL DATA:  Intubated. EXAM: PORTABLE CHEST 1 VIEW COMPARISON:  X-ray from the same day FINDINGS: The endotracheal tube terminates approximately 4.8 cm above the carina. There is a newly placed right-sided central venous catheter that appears well position near the distal SVC/cavoatrial junction. The tunneled dialysis catheter on the right is  stable. The heart remains enlarged. Multifocal airspace opacities are again noted. Aortic calcifications are again seen. The previously noted lucency projecting over the left mediastinum has resolved. The enteric tube extends below the left hemidiaphragm. IMPRESSION: 1. Lines and tubes as above.  No pneumothorax. 2. Persistent multifocal airspace opacities. 3. Cardiomegaly. 4. Previously noted linear lucency overlying the left mediastinum has resolved and was likely artifact. Electronically Signed   By: Harrell Gave  Green M.D.   On: 03/06/2019 20:35   Dg Chest Port 1 View  Result Date: 03/06/2019 CLINICAL DATA:  Aspiration EXAM: PORTABLE CHEST 1 VIEW COMPARISON:  March 05, 2019 FINDINGS: The heart size is significantly enlarged. An endotracheal tube is not reliably visualized on this exam. There is a well-positioned tunneled dialysis catheter on the right. There are dense bilateral airspace opacities. There are small bilateral pleural effusions. There is no pneumothorax. The lung fields are partially obscured by an overlying being defibrillator pad. There is a thin linear density projecting over the left mediastinum. IMPRESSION: 1. Lines and tubes as above. 2. Persistent diffuse multifocal airspace opacities, likely not significantly changed from prior CT chest. 3. Linear lucency projecting over the left mediastinum is favored to represent artifact, less likely pneumomediastinum. Attention on follow-up examinations versus a short interval repeat chest x-ray is recommended. Electronically Signed   By: Constance Holster M.D.   On: 03/06/2019 19:11   Dg Abd Portable 1v  Result Date: 03/06/2019 CLINICAL DATA:  OG tube placement EXAM: PORTABLE ABDOMEN - 1 VIEW COMPARISON:  None. FINDINGS: The OG tube projects over the gastric body. The tube is coiled once. The bowel gas pattern is nonspecific and nonobstructive. Calcifications project over the patient's pelvis. IMPRESSION: OG tube projects over the patient's  gastric body. Electronically Signed   By: Constance Holster M.D.   On: 03/06/2019 20:36   Medications: . sodium chloride    . sodium chloride    . dexmedetomidine (PRECEDEX) IV infusion Stopped (03/08/19 0714)  . piperacillin-tazobactam (ZOSYN)  IV 3.375 g (03/08/19 0811)   . sodium chloride   Intravenous Once  . aspirin EC  81 mg Oral Daily  . calcitRIOL  0.25 mcg Oral Q T,Th,Sat-1800  . chlorhexidine gluconate (MEDLINE KIT)  15 mL Mouth Rinse BID  . Chlorhexidine Gluconate Cloth  6 each Topical Q0600  . [START ON 03/12/2019] darbepoetin (ARANESP) injection - DIALYSIS  200 mcg Intravenous Q Thu-HD  . feeding supplement (NEPRO CARB STEADY)  1,000 mL Per Tube Q24H  . feeding supplement (PRO-STAT SUGAR FREE 64)  30 mL Per Tube BID  . gabapentin  100 mg Oral TID  . heparin  5,000 Units Subcutaneous Q8H  . hydrALAZINE  25 mg Per Tube TID  . insulin aspart  0-15 Units Subcutaneous Q4H  . insulin glargine  5 Units Subcutaneous Daily  . isosorbide dinitrate  10 mg Oral BID  . mouth rinse  15 mL Mouth Rinse 10 times per day  . methylPREDNISolone (SOLU-MEDROL) injection  40 mg Intravenous Q12H  . nicotine  14 mg Transdermal Daily  . pantoprazole sodium  40 mg Per Tube Daily  . sevelamer carbonate  1.6 g Per Tube TID WC  . sodium chloride flush  3 mL Intravenous Q12H

## 2019-03-09 ENCOUNTER — Inpatient Hospital Stay (HOSPITAL_COMMUNITY): Payer: Medicare Other

## 2019-03-09 DIAGNOSIS — I10 Essential (primary) hypertension: Secondary | ICD-10-CM

## 2019-03-09 DIAGNOSIS — G9340 Encephalopathy, unspecified: Secondary | ICD-10-CM

## 2019-03-09 DIAGNOSIS — R569 Unspecified convulsions: Secondary | ICD-10-CM

## 2019-03-09 LAB — GLUCOSE, CAPILLARY
Glucose-Capillary: 219 mg/dL — ABNORMAL HIGH (ref 70–99)
Glucose-Capillary: 233 mg/dL — ABNORMAL HIGH (ref 70–99)
Glucose-Capillary: 159 mg/dL — ABNORMAL HIGH (ref 70–99)
Glucose-Capillary: 131 mg/dL — ABNORMAL HIGH (ref 70–99)
Glucose-Capillary: 205 mg/dL — ABNORMAL HIGH (ref 70–99)
Glucose-Capillary: 134 mg/dL — ABNORMAL HIGH (ref 70–99)
Glucose-Capillary: 218 mg/dL — ABNORMAL HIGH (ref 70–99)

## 2019-03-09 LAB — IRON AND TIBC
Iron: 37 ug/dL (ref 28–170)
Saturation Ratios: 15 % (ref 10.4–31.8)
TIBC: 251 ug/dL (ref 250–450)
UIBC: 214 ug/dL

## 2019-03-09 LAB — CBC
HCT: 27.8 % — ABNORMAL LOW (ref 36.0–46.0)
Hemoglobin: 8.7 g/dL — ABNORMAL LOW (ref 12.0–15.0)
MCH: 22.9 pg — ABNORMAL LOW (ref 26.0–34.0)
MCHC: 31.3 g/dL (ref 30.0–36.0)
MCV: 73.2 fL — ABNORMAL LOW (ref 80.0–100.0)
Platelets: 318 10*3/uL (ref 150–400)
RBC: 3.8 MIL/uL — ABNORMAL LOW (ref 3.87–5.11)
RDW: 24.3 % — ABNORMAL HIGH (ref 11.5–15.5)
WBC: 16.9 10*3/uL — ABNORMAL HIGH (ref 4.0–10.5)
nRBC: 0.3 % — ABNORMAL HIGH (ref 0.0–0.2)

## 2019-03-09 LAB — BASIC METABOLIC PANEL
Anion gap: 20 — ABNORMAL HIGH (ref 5–15)
BUN: 94 mg/dL — ABNORMAL HIGH (ref 8–23)
CO2: 22 mmol/L (ref 22–32)
Calcium: 8.6 mg/dL — ABNORMAL LOW (ref 8.9–10.3)
Chloride: 93 mmol/L — ABNORMAL LOW (ref 98–111)
Creatinine, Ser: 7.89 mg/dL — ABNORMAL HIGH (ref 0.44–1.00)
GFR calc Af Amer: 6 mL/min — ABNORMAL LOW (ref >60)
GFR calc non Af Amer: 5 mL/min — ABNORMAL LOW (ref >60)
Glucose, Bld: 217 mg/dL — ABNORMAL HIGH (ref 70–99)
Potassium: 4.5 mmol/L (ref 3.5–5.1)
Sodium: 135 mmol/L (ref 135–145)

## 2019-03-09 LAB — POTASSIUM: Potassium: 4 mmol/L (ref 3.5–5.1)

## 2019-03-09 LAB — MAGNESIUM: Magnesium: 2.3 mg/dL (ref 1.7–2.4)

## 2019-03-09 LAB — CULTURE, RESPIRATORY W GRAM STAIN
Culture: NORMAL
Gram Stain: NONE SEEN

## 2019-03-09 LAB — PROCALCITONIN: Procalcitonin: 2.3 ng/mL

## 2019-03-09 MED ORDER — ISOSORBIDE DINITRATE 20 MG PO TABS
20.0000 mg | ORAL_TABLET | Freq: Two times a day (BID) | ORAL | Status: DC
  Administered 2019-03-09 – 2019-03-11 (×6): 20 mg via ORAL
  Filled 2019-03-09 (×6): qty 1

## 2019-03-09 MED ORDER — INSULIN GLARGINE 100 UNIT/ML ~~LOC~~ SOLN
10.0000 [IU] | Freq: Every day | SUBCUTANEOUS | Status: DC
  Administered 2019-03-09 – 2019-03-13 (×5): 10 [IU] via SUBCUTANEOUS
  Filled 2019-03-09 (×6): qty 0.1

## 2019-03-09 MED ORDER — HEPARIN SODIUM (PORCINE) 1000 UNIT/ML DIALYSIS
3000.0000 [IU] | Freq: Once | INTRAMUSCULAR | Status: AC
  Administered 2019-03-09: 3000 [IU] via INTRAVENOUS_CENTRAL

## 2019-03-09 MED ORDER — METOPROLOL TARTRATE 5 MG/5ML IV SOLN
5.0000 mg | Freq: Once | INTRAVENOUS | Status: AC
  Administered 2019-03-09: 15:00:00 5 mg via INTRAVENOUS

## 2019-03-09 MED ORDER — HYDRALAZINE HCL 50 MG PO TABS
50.0000 mg | ORAL_TABLET | Freq: Three times a day (TID) | ORAL | Status: DC
  Administered 2019-03-09 – 2019-03-13 (×13): 50 mg
  Filled 2019-03-09 (×15): qty 1

## 2019-03-09 MED ORDER — HEPARIN SODIUM (PORCINE) 1000 UNIT/ML IJ SOLN
INTRAMUSCULAR | Status: AC
  Filled 2019-03-09: qty 3

## 2019-03-09 MED ORDER — DARBEPOETIN ALFA 100 MCG/0.5ML IJ SOSY
100.0000 ug | PREFILLED_SYRINGE | INTRAMUSCULAR | Status: DC
  Administered 2019-03-09: 100 ug via SUBCUTANEOUS
  Filled 2019-03-09: qty 0.5

## 2019-03-09 MED ORDER — GLYCOPYRROLATE 1 MG PO TABS
1.0000 mg | ORAL_TABLET | Freq: Once | ORAL | Status: AC
  Administered 2019-03-09: 1 mg
  Filled 2019-03-09: qty 1

## 2019-03-09 MED ORDER — METOPROLOL TARTRATE 5 MG/5ML IV SOLN
INTRAVENOUS | Status: AC
  Administered 2019-03-09: 15:00:00 5 mg via INTRAVENOUS
  Filled 2019-03-09: qty 5

## 2019-03-09 MED ORDER — METOPROLOL TARTRATE 5 MG/5ML IV SOLN
5.0000 mg | INTRAVENOUS | Status: DC | PRN
  Administered 2019-03-09 – 2019-03-11 (×6): 5 mg via INTRAVENOUS
  Filled 2019-03-09 (×6): qty 5

## 2019-03-09 NOTE — Progress Notes (Signed)
Rhonda Lynch   Subjective:   This is a very pleasant 63 year old lady that was admitted with shortness of breath pulmonary consolidation intubated by ICU was started on empiric antibiotics and IV steroids.  She was diagnosed with hypoxic respiratory failure due to bilateral pneumonia.  She is a Tuesday Thursday Saturday dialysis patient that is currently off schedule and is receiving dialysis Monday, 03/09/2019.  She has a right forearm AV graft and a hemodialysis catheter.  She has a history of cocaine abuse, tobacco abuse and emphysema.  She also has a history of hepatitis C.  Blood pressure 173/91 pulse 74 temperature 98 O2 sats 99% FiO2 30% intubated.  Sodium 135 potassium 4.5 chloride 93 CO2 22 BUN 94 creatinine 7.89 glucose 217 calcium 8.6 iron saturations 15% WBC 6.9 hemoglobin 8.7 platelets 318, phosphorus 5.6  Status post transfusion 2 units packed red blood cells 03/06/2019  Aspirin 81 mg daily, Nepro 1000 cc every 24 hours, Protostat 30 cc twice daily, gabapentin 100 mg 3 times daily hydralazine 25 mg 3 times daily, insulin sliding scale, Lantus 5 units nightly, isosorbide 10 mg twice daily, Solu-Medrol 40 mg every 12 hours, nicotine patch every 24 hours, Protonix 40 mg daily, Sevelamer 1.6 g 3 times daily,  IV Zosyn 3.375 g every 12 hours,  Chest x-ray stable cardiomegaly with partial clearing of bilateral pulmonary interstitial infiltrates suggesting pulmonary edema  2D echo 01/12/2019 EF 40 to 45% severely dilated left atrium and small pericardial effusion.  There is mild valvular thickening  CT scan of head 03/06/2019 no intracranial pathology  CT scan of chest 03/05/2019 small pericardial effusion new compared to previous study, dense peribronchial vascular Rhonda Lynch and consolidative airspace opacities bilaterally worsened since 02/03/2019 with underlying centrilobular emphysema small bilateral pleural effusions.  Abdominal x-ray OG tube projecting  over the patient's gastric body  Objective:  Vital signs in last 24 hours:  Temp:  [97.8 F (36.6 C)-98.2 F (36.8 C)] 98 F (36.7 C) (06/29 0700) Pulse Rate:  [46-136] 74 (06/29 0800) Resp:  [15-22] 22 (06/29 0800) BP: (121-182)/(71-100) 173/91 (06/29 0800) SpO2:  [100 %] 100 % (06/29 0800) FiO2 (%):  [30 %-40 %] 30 % (06/29 0800) Weight:  [56.7 kg] 56.7 kg (06/29 0411)  Weight change: 1.4 kg Filed Weights   03/07/19 0255 03/08/19 0409 03/09/19 0411  Weight: 53.4 kg 55.3 kg 56.7 kg    Intake/Output: I/O last 3 completed shifts: In: 1671.4 [I.V.:62.4; NG/GT:1455; IV Piggyback:154] Out: -    Intake/Output this shift:  Total I/O In: 130 [NG/GT:130] Out: -  Intubated and sedated CVS- RRR RS-scattered rhonchorous breath sounds ventilator ABD- BS present soft non-distended OG tube EXT- no edema right AV graft with thrill bruit right [facility]   Basic Metabolic Panel: Recent Labs  Lab 03/06/19 0531 03/06/19 1820 03/06/19 1901 03/06/19 2017 03/07/19 0326 03/08/19 0410 03/09/19 0442  NA 137 136 134* 137 137 135 135  K 3.5 4.0 3.7 4.2 3.7 4.7 4.5  CL 97* 99 98  --  100 96* 93*  CO2 _0 --  _1 GLUCOSE 101* 201* 253*  --  144* 209* 217*  BUN _2 --  25* 59* 94*  CREATININE 4.38* 3.59* 3.77*  --  4.71* 6.48* 7.89*  CALCIUM 8.4* 8.5* 8.3*  --  8.8* 8.9 8.6*  PHOS 4.2  --   --   --  5.6*  --   --     Liver  Function Tests: Recent Labs  Lab 03/05/2019 1049 03/06/19 0531 03/07/19 0326  AST 43*  --   --   ALT 23  --   --   ALKPHOS 110  --   --   BILITOT 0.8  --   --   PROT 7.8  --   --   ALBUMIN 2.1* 2.0* 2.0*   Recent Labs  Lab 02/28/2019 1049  LIPASE 46   No results for input(s): AMMONIA in the last 168 hours.  CBC: Recent Labs  Lab 03/09/2019 1049  03/06/19 1901 03/06/19 2017 03/07/19 0326 03/07/19 1925 03/08/19 0410 03/09/19 0442  WBC 12.5*   < > 16.0*  --  10.9* 10.9* 11.2* 16.9*  NEUTROABS 9.2*  --   --   --   --   --   --   --    HGB 7.7*   < > 8.5* 9.9* 7.5* 7.5* 7.8* 8.7*  HCT 25.6*   < > 27.4* 29.0* 24.3* 24.7* 25.2* 27.8*  MCV 72.5*   < > 73.5*  --  73.6* 74.8* 73.5* 73.2*  PLT 288   < > 241  --  214 219 242 318   < > = values in this interval not displayed.    Cardiac Enzymes: No results for input(s): CKTOTAL, CKMB, CKMBINDEX, TROPONINI in the last 168 hours.  BNP: Invalid input(s): POCBNP  CBG: Recent Labs  Lab 03/08/19 1102 03/08/19 1515 03/08/19 1920 03/09/19 0351 03/09/19 0706  GLUCAP 155* 198* 157* 233* 159*    Microbiology: Results for orders placed or performed during the hospital encounter of 03/01/2019  SARS Coronavirus 2 (CEPHEID - Performed in [facility] hospital lab), Hosp Order     Status: None   Collection Time: 02/18/2019 12:56 PM   Specimen: Nasopharyngeal Swab  Result Value Ref Range Status   SARS Coronavirus 2 NEGATIVE NEGATIVE Final    Comment: (Lynch) If result is NEGATIVE SARS-CoV-2 target nucleic acids are NOT DETECTED. The SARS-CoV-2 RNA is generally detectable in upper and lower  respiratory specimens during the acute phase of infection. The lowest  concentration of SARS-CoV-2 viral copies this assay can detect is 250  copies / mL. A negative result does not preclude SARS-CoV-2 infection  and should not be used as the sole basis for treatment or other  patient management decisions.  A negative result may occur with  improper specimen collection / handling, submission of specimen other  than nasopharyngeal swab, presence of viral mutation(s) within the  areas targeted by this assay, and inadequate number of viral copies  (<250 copies / mL). A negative result must be combined with clinical  observations, patient history, and epidemiological information. If result is POSITIVE SARS-CoV-2 target nucleic acids are DETECTED. The SARS-CoV-2 RNA is generally detectable in upper and lower  respiratory specimens dur ing the acute phase of infection.  Positive  results are  indicative of active infection with SARS-CoV-2.  Clinical  correlation with patient history and other diagnostic information is  necessary to determine patient infection status.  Positive results do  not rule out bacterial infection or co-infection with other viruses. If result is PRESUMPTIVE POSTIVE SARS-CoV-2 nucleic acids MAY BE PRESENT.   A presumptive positive result was obtained on the submitted specimen  and confirmed on repeat testing.  While 2019 novel coronavirus  (SARS-CoV-2) nucleic acids may be present in the submitted sample  additional confirmatory testing may be necessary for epidemiological  and / or clinical management purposes  to differentiate between  SARS-CoV-2 and other Sarbecovirus currently known to infect humans.  If clinically indicated additional testing with an alternate test  methodology [id number]) is advised. The SARS-CoV-2 RNA is generally  detectable in upper and lower respiratory sp ecimens during the acute  phase of infection. The expected result is Negative. Fact Sheet for Patients:  StrictlyIdeas.no Fact Sheet for Healthcare Providers: BankingDealers.co.za This test is not yet approved or cleared by the [place] FDA and has been authorized for detection and/or diagnosis of SARS-CoV-2 by FDA under an Emergency Use Authorization (EUA).  This EUA will remain in effect (meaning this test can be used) for the duration of the COVID-19 declaration under Section 564(b)(1) of the Act, 21 U.S.C. section 360bbb-3(b)(1), unless the authorization is terminated or revoked sooner. Performed at [facility], [place] [address] [place], [place] [address]   Culture, blood (routine x 2)     Status: None (Preliminary result)   Collection Time: 03/05/19 10:58 PM   Specimen: BLOOD  Result Value Ref Range Status   Specimen Description BLOOD LEFT ANTECUBITAL  Final   Special Requests   Final    BOTTLES DRAWN  AEROBIC ONLY Blood Culture adequate volume   Culture   Final    NO GROWTH 2 DAYS Performed at [facility], [place] [address] [place], [place] [address]    Report Status PENDING  Incomplete  Culture, blood (Routine X 2) w Reflex to ID Panel     Status: None (Preliminary result)   Collection Time: 03/05/19 11:05 PM   Specimen: BLOOD  Result Value Ref Range Status   Specimen Description BLOOD SITE NOT SPECIFIED  Final   Special Requests   Final    BOTTLES DRAWN AEROBIC ONLY Blood Culture adequate volume   Culture   Final    NO GROWTH 2 DAYS Performed at [facility], [address], [place], [place] [address]    Report Status PENDING  Incomplete  MRSA PCR Screening     Status: None   Collection Time: 03/06/19  6:57 PM   Specimen: Nasal Mucosa; Nasopharyngeal  Result Value Ref Range Status   MRSA by PCR NEGATIVE NEGATIVE Final    Comment:        The GeneXpert MRSA Assay (FDA approved for NASAL specimens only), is one component of a comprehensive MRSA colonization surveillance program. It is not intended to diagnose MRSA infection nor to guide or monitor treatment for MRSA infections. Performed at [facility], [place] [address] [place], [place] [address]   Culture, respiratory (non-expectorated)     Status: None (Preliminary result)   Collection Time: 03/06/19  8:21 PM   Specimen: Tracheal Aspirate; Respiratory  Result Value Ref Range Status   Specimen Description TRACHEAL ASPIRATE  Final   Special Requests NONE  Final   Gram Stain NO WBC SEEN NO ORGANISMS SEEN   Final   Culture   Final    FEW Consistent with normal respiratory flora. Performed at [facility], [place] [address] [place], [place] [address]    Report Status PENDING  Incomplete    Coagulation Studies: No results for input(s): LABPROT, INR in the last 72 hours.  Urinalysis: No results for input(s): COLORURINE, LABSPEC, PHURINE, GLUCOSEU, HGBUR, BILIRUBINUR, KETONESUR, PROTEINUR,  UROBILINOGEN, NITRITE, LEUKOCYTESUR in the last 72 hours.  Invalid input(s): APPERANCEUR    Imaging: Dg Chest Port 1 View  Result Date: 03/09/2019 CLINICAL DATA:  Respiratory failure. EXAM: PORTABLE CHEST 1 VIEW COMPARISON:  03/08/2019. FINDINGS: Endotracheal tube, NG tube, right IJ and right subclavian central line stable position. Stable cardiomegaly. Partial clearing of bilateral pulmonary interstitial prominence suggesting clearing interstitial edema. Tiny right pleural effusion cannot be excluded. No pneumothorax. IMPRESSION: 1.  Lines and tubes in stable position. 2. Stable cardiomegaly. Partial clearing of bilateral pulmonary interstitial prominence suggesting clearing interstitial edema. Tiny right pleural effusion cannot be excluded. Electronically Signed   By: Marcello Moores  Register   On: 03/09/2019 06:31   Dg Chest Port 1 View  Result Date: 03/08/2019 CLINICAL DATA:  Acute respiratory failure EXAM: PORTABLE CHEST 1 VIEW COMPARISON:  Chest x-rays dated 03/06/2019 and 03/05/2019 FINDINGS: Endotracheal tube is well positioned with tip just above the level of the carina. RIGHT IJ and RIGHT subclavian central catheter is appear well positioned with tips overlying the RIGHT atrium. Enteric tube passes below the diaphragm. Stable cardiomegaly. Stable mild bilateral interstitial prominence. Asymmetric opacities throughout the RIGHT lung, slightly more prominent than on yesterday's chest x-ray and similar to appearance on earlier exam of 03/05/2019, multifocal pneumonia versus asymmetric edema. No pleural effusion or pneumothorax seen. IMPRESSION: 1. Endotracheal tube well positioned with tip just above the level of the carina. Additional support apparatus appears stable. 2. Stable cardiomegaly. 3. Asymmetric opacities throughout the RIGHT lung, similar to appearance on chest x-ray of 03/05/2019, multifocal pneumonia versus asymmetric edema. Electronically Signed   By: Franki Cabot M.D.   On: 03/08/2019  05:30     Medications:   . dexmedetomidine (PRECEDEX) IV infusion Stopped (03/08/19 0711)  . piperacillin-tazobactam (ZOSYN)  IV 3.375 g (03/09/19 0801)   . sodium chloride   Intravenous Once  . aspirin EC  81 mg Oral Daily  . calcitRIOL  0.25 mcg Oral Q T,Th,Sat-1800  . chlorhexidine gluconate (MEDLINE KIT)  15 mL Mouth Rinse BID  . Chlorhexidine Gluconate Cloth  6 each Topical Q0600  . [START ON 03/12/2019] darbepoetin (ARANESP) injection - DIALYSIS  200 mcg Intravenous Q Thu-HD  . feeding supplement (NEPRO CARB STEADY)  1,000 mL Per Tube Q24H  . feeding supplement (PRO-STAT SUGAR FREE 64)  30 mL Per Tube BID  . gabapentin  100 mg Oral TID  . [START ON 03/10/2019] heparin  3,000 Units Dialysis Once in dialysis  . heparin  5,000 Units Subcutaneous Q8H  . hydrALAZINE  25 mg Per Tube TID  . insulin aspart  0-15 Units Subcutaneous Q4H  . insulin glargine  5 Units Subcutaneous Daily  . isosorbide dinitrate  10 mg Oral BID  . mouth rinse  15 mL Mouth Rinse 10 times per day  . methylPREDNISolone (SOLU-MEDROL) injection  40 mg Intravenous Q12H  . nicotine  14 mg Transdermal Daily  . pantoprazole sodium  40 mg Per Tube Daily  . sevelamer carbonate  1.6 g Per Tube TID WC  . sodium chloride flush  3 mL Intravenous Q12H   calcium carbonate (dosed in mg elemental calcium), camphor-menthol **AND** [DISCONTINUED] hydrOXYzine, docusate sodium, fentaNYL (SUBLIMAZE) injection, heparin, ipratropium-albuterol, midazolam, ondansetron **OR** ondansetron (ZOFRAN) IV, oxyCODONE, polyethylene glycol, sorbitol  Assessment/ Plan:   ESRD-Tuesday Thursday Saturday dialysis patient she is currently off schedule.  She receives dialysis 03/09/2019.  She has a AV graft and hemodialysis catheter  Anemia status post transfusion x2 units 03/06/2019 last iron saturations 15%.  We will continue to follow.  Will start darbepoetin 100 mcg weekly.  Bones.  Continues on sevelamer binders.  Phosphorus reasonable at this  point.  Calcium controlled.  She also continues on calcitriol 0.25 mcg  daily  Hypertension.  Continues on antihypertensive medications.  We will continue to follow as she will receive dialysis 03/09/2019 with ultrafiltration  Congestive heart failure systolic dysfunction EF 40 to 45%  Pneumonia continues on Zosyn IV  Emphysema IV Solu-Medrol.  Diabetes mellitus as per primary team  Nutrition continues on Protostat twice daily and Nepro 1 L per 24 hours  History of hepatitis C  History of polysubstance abuse  Encephalopathy with a history of CVA in the past negative CT scan of head plan for EEG.   LOS: [place] _0 _1 :20 AM

## 2019-03-09 NOTE — Progress Notes (Signed)
Slight eye twitching and fluttering noted. Dr. Elsworth Soho notified of change. Order received for EEG to be done STAT. EEG paged and notified, who are on their way.

## 2019-03-09 NOTE — Progress Notes (Signed)
Name: Rhonda Lynch MRN: [id number] DOB: 06/21/1956    ADMISSION DATE:  02/24/2019 CONSULTATION DATE: 03/06/2019  REFERRING MD : [facility]  CHIEF COMPLAINT: Acute on chronic hypoxia  BRIEF PATIENT DESCRIPTION: 63 yo FM, ESRD, chronic cocaine use, tobacco use, admitted for hypoxic respiratory failure due to BL pna, CT imaging with peri-bronchovascular infiltrates, proximal perlymphatic and perihilar distribution with intralobular septal thickening and BL GGO.  On review, infiltrates dating back to 11/2018 on CT scans, CT abdomen from 10/2018 shows clear lungs Had respiratory arrest 6/26  SIGNIFICANT EVENTS  03/05/2019 hypoxic event 6/26 respiratory arrest on dialysis, did not lose pulse  STUDIES:  03/05/2019 CT scan again shows bilateral airspace disease and cardiomegaly 6/26 head CT neg    ETT 6/26 >> RIJ 6/26 >>   resp cx 6/26 > neg [facility] 6/26 >> neg   6/26 zosyn >>  PAST MEDICAL HISTORY :   has a past medical history of Diabetes mellitus without complication ([place]), ESRD (end stage renal disease) on dialysis ([place]), Hepatitis C, Hypertension, Oxygen deficiency, Stroke ([place]), and Substance abuse ([place]).  has a past surgical history that includes IR Fluoro Guide CV Line Right (02/18/2018); IR US Guide Vasc Access Right (02/18/2018); IR Fluoro Guide CV Line Right (02/26/2018); AV fistula placement (Left, 03/05/2018); Hematoma evacuation (Left, 03/06/2018); IR Removal Tun Cv Cath W/O FL (04/27/2018); TEE without cardioversion (N/A, 04/29/2018); Insertion of dialysis catheter (Left, 04/29/2018); AV fistula placement (Left, 07/21/2018); Bascilic vein transposition (Left, 09/29/2018); I&D extremity (Left, 10/06/2018); venogram (Left, 10/27/2018); AV fistula placement (Left, 10/27/2018); Thrombectomy and revision of arterioventous (av) goretex  graft (Left, 12/11/2018); IR US Guide Vasc Access Right (01/14/2019); IR Fluoro Guide CV Line Right (01/14/2019); Insertion of dialysis catheter (Right, 01/19/2019); AV  fistula placement (Right, 01/19/2019); IR THROMBECTOMY AV FISTULA W/THROMBOLYSIS INC/SHUNT/IMG RIGHT (02/20/2019); and IR US Guide Vasc Access Right (02/20/2019).    SUBJECTIVE:   Meds critically ill, intubated Off Precedex drip but remains unresponsive Afebrile Increase secretions overnight for which Robinul was given    VITAL SIGNS: Temp:  [97.8 F (36.6 C)-98.2 F (36.8 C)] 98 F (36.7 C) (06/29 0700) Pulse Rate:  [46-136] 74 (06/29 0800) Resp:  [15-22] 22 (06/29 0800) BP: (121-182)/(71-100) 173/91 (06/29 0800) SpO2:  [100 %] 100 % (06/29 0800) FiO2 (%):  [30 %-40 %] 30 % (06/29 0800) Weight:  [56.7 kg] 56.7 kg (06/29 0411)  PHYSICAL EXAMINATION: Unchanged General: Chronically ill-appearing, orally intubated  Neuro: Does not follow commands, localizes pain, RA SS -3 HEENT: No JVD or lymphadenopathy is appreciated.  Dry oral mucosa. Cardiovascular: Heart sounds are regular Lungs: Decreased in bases, bilateral scattered rhonchi Abdomen: Soft, nontender, hypoactive bowel sounds Musculoskeletal: Intact Skin: 1+ edema extremity  Chest x-ray 6/29 personally reviewed which showed improved aeration bilateral   Acute on chronic hypoxic respiratory failure  -  Bilateral infiltrates/diffuse lung disease -dates back to 11/2018, attributed to chronic lung, drug screen was positive for cocaine in 11/2018 not checked this admit Differential includes ILD less likely and pulmonary edema due to Volume overload  -Ct spontaneous breathing trials -tolerates pressure support 10/5 with low tidal volumes -ct Empiric Solu-Medrol 40 every 12, monitor sugars -ct zosyn x 5ds total ,  resp cx neg, procalcitonin slight high  Acute systolic/diastolic heart failure Echo 01/2019 shows EF 45%, RVSP 49 Elevated troponin -Aspirin daily, titrate hydralazine/ nitrates as blood pressure tolerates  Acute encephalopathy with history of CVA  -Minimize fentanyl, use Precedex if needed, goal RA SS 0 to -1  -Obtain  EEG today, head CT was negative  ESRD -dialysis per renal  Diabetes, insulin-requiring -SSI  -Increase  Lantus 10 U   The patient is critically ill with multiple organ systems failure and requires high complexity decision making for assessment and support, frequent evaluation and titration of therapies, application of advanced monitoring technologies and extensive interpretation of multiple databases. Critical Care Time devoted to patient care services described in this note independent of APP/resident  time is 32 minutes.      Kara Mead MD. [place].  Pulmonary & Critical care Pager [phone] If no response call [phone]    03/09/2019, 8:28 AM

## 2019-03-09 NOTE — Consult Note (Addendum)
Neurology Consultation  Reason for Consult: Rule out sub-clinical seizures  Referring Physician: Dr. Elsworth Soho  CC: Acute on chronic hypoxia  History is obtained from: Notes and bedside RN  HPI: Rhonda Lynch is a 63 y.o. female, PMH significant for ESRD, chronic cocaine use, HTN, Hep C, DM type 2, tobacco use, and prior hx of CVA, admitted for hypoxic respiratory failure due to BL PNA, stay complicated by pulmonary edema d/t fluid overload. Patient remains intubated for airway protection/support. Per bedside nurse, patient has been very somnolent and unarousable. In addition, she was noted to have intermittent eye fluttering movements today that lasted about 2 minutes. Stat CTH was negative for underlying causes. Spot EEG ordered and neurology was consulted to help rule out subclinical seizures.  On assessment, patient is altered and unable to participate in the exam. She withdraws briskly to pain on R hemibody but does not withdraw on L hemi body. Tone is flaccid on her L hemibody.  Work up that has been done: Spot EEG- This is an abnormal electroencephalogram due to the presence of general background slowing and disorganization with bilateral, independent triphasic waves. This are seen most commonly in encephalopathic states.   CTH benign  ROS: Unable to obtain due to altered mental status.   Past Medical History:  Diagnosis Date  . Diabetes mellitus without complication ([place])   . ESRD (end stage renal disease) on dialysis ([place])    "TTS; [facility]; they're moving me to another one" (09/22/2018)  . Hepatitis C    diagnosed 09/2018  . Hypertension   . Oxygen deficiency    2 L at night  . Stroke ([place])   . Substance abuse ([place])    has been clean for 4 months    Family History  Problem Relation Age of Onset  . Autoimmune disease Neg Hx    Social History:   reports that she has been smoking cigarettes. She has a 24.00 pack-year smoking history. She has never used smokeless tobacco.  She reports previous alcohol use. She reports current drug use. Drugs: Cocaine and Heroin.  Medications Scheduled Meds: . sodium chloride   Intravenous Once  . aspirin EC  81 mg Oral Daily  . calcitRIOL  0.25 mcg Oral Q T,Th,Sat-1800  . chlorhexidine gluconate (MEDLINE KIT)  15 mL Mouth Rinse BID  . Chlorhexidine Gluconate Cloth  6 each Topical Q0600  . darbepoetin (ARANESP) injection - NON-DIALYSIS  100 mcg Subcutaneous Q Mon-1800  . feeding supplement (NEPRO CARB STEADY)  1,000 mL Per Tube Q24H  . feeding supplement (PRO-STAT SUGAR FREE 64)  30 mL Per Tube BID  . gabapentin  100 mg Oral TID  . heparin      . heparin  5,000 Units Subcutaneous Q8H  . hydrALAZINE  50 mg Per Tube TID  . insulin aspart  0-15 Units Subcutaneous Q4H  . insulin glargine  10 Units Subcutaneous Daily  . isosorbide dinitrate  20 mg Oral BID  . mouth rinse  15 mL Mouth Rinse 10 times per day  . methylPREDNISolone (SOLU-MEDROL) injection  40 mg Intravenous Q12H  . nicotine  14 mg Transdermal Daily  . pantoprazole sodium  40 mg Per Tube Daily  . sevelamer carbonate  1.6 g Per Tube TID WC  . sodium chloride flush  3 mL Intravenous Q12H   Continuous Infusions: . dexmedetomidine (PRECEDEX) IV infusion Stopped (03/08/19 0711)  . piperacillin-tazobactam (ZOSYN)  IV 12.5 mL/hr at 03/09/19 1100   PRN Meds:.calcium carbonate (dosed in  mg elemental calcium), camphor-menthol **AND** [DISCONTINUED] hydrOXYzine, docusate sodium, fentaNYL (SUBLIMAZE) injection, heparin, ipratropium-albuterol, ondansetron **OR** ondansetron (ZOFRAN) IV, oxyCODONE, polyethylene glycol, sorbitol   Current vital signs: BP 133/78 (BP Location: Left Arm)   Pulse 88   Temp 98 F (36.7 C) (Axillary)   Resp (!) 21   Wt 52.4 kg   SpO2 99%   BMI 21.83 kg/m   Vital signs in last 24 hours: Temp:  [97.8 F (36.6 C)-98.2 F (36.8 C)] 98 F (36.7 C) (06/29 1205) Pulse Rate:  [46-136] 88 (06/29 1102) Resp:  [16-22] 21 (06/29 1102) BP:  (123-182)/(71-100) 133/78 (06/29 1205) SpO2:  [99 %-100 %] 99 % (06/29 1102) FiO2 (%):  [30 %-40 %] 30 % (06/29 1304) Weight:  [52.4 kg-56.7 kg] 52.4 kg (06/29 1205)  Physical Exam   Physical Exam  Constitutional: Intubated, eyes closed, NAD Psych: Unable to assess HEENT: AT/[place], ETT in place, no neck stiffness noted Cardiovascular: Normal rate and regular rhythm.  Respiratory: mechanical breath sounds bilat GI: Soft. No distension.   Neuro: Mental Status:  Patient is obtunded. Not following commands. No attempts to communicate. Does not gaze away from or towards visual stimuli. Does not exhibit awareness of external stimuli.  Cranial Nerves: II: Unable to assess visual fields. Pupils equal, 25m, round and sluggishly reactive to light III,IV, VI: No doll's eye reflex present.  V: No reaction grossly to fine touch VII: Facial movement is symmetric.  VIII: UTA hearing X: UTA palate symmetry XI: UTA XII: UTA due to ETT Motor: RUE with rigid flexor and extensor tone. Moves to noxious stimuli semipurposefully RLE with increased tone, but not as severe as RUE.  LUE flaccid with no movement to any stimuli. LLE with flaccid tone and minimal withdrawal to noxious  Sensory: Responses to noxious stimuli are asymmetric- R hemibody- withdraws briskly to pain, L hemibody- no withdrawal to pain Deep Tendon Reflexes: Brisk LUE and LLE; reduced to RUE and RLE Plantars: Toes are downgoing bilaterally.  Cerebellar/Gait: Unable to assess  Labs I have reviewed labs in epic.   Imaging I have reviewed the images obtained: CT-scan of the brain: No acute intracranial pathology.  Small-vessel white matter disease.  EEG: This is an abnormal electroencephalogram due to the presence of general background slowing and disorganization with bilateral, independent triphasic waves. These are seen most commonly in encephalopathic states.    Assessment: 63year old female with hx of substance abuse, prior  CVA, ESRD, DM2, and Hep C, admitted for acute hypoxic respiratory failure secondary to bilateral pneumonia. Patient has been difficult to arouse and notably lethargic, noted to have fluttering eyelid movements and L sided flaccidity. 1. 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 2. Spot EEG without seizures, but with generalized background slowing with bilateral independent triphasic waves, most consistent with an encephalopathic state. Of note, these findings do not rule out intermittent seizure activity not captured on the spot EEG. 3. CT head reveals no acute intracranial pathology.  Small-vessel white matter disease is noted.   Recommendations: - Initiate LTM - Hold starting anti-convulsant meds unless seizure activity is captured on LTM - MRI brain wo contrast to be done tomorrow - Neurology will continue to follow with you  APosey ProntoPA-C [facility]  I have seen and examined the patient. 63year old female with encephalopathy and possible seizure-like  activity. Exam exhibits findings consistent with diffuse cerebral dysfunction, in addition to possible focal right hemisphere lesion such as stroke, which may have been missed on initial CT head. Plan is to start LTM EEG and obtain MRI brain.  Electronically signed: Dr. Kerney Elbe

## 2019-03-09 NOTE — Progress Notes (Addendum)
eLink Physician-Brief Progress Note Patient Name: Rhonda Lynch DOB: May 01, 1956 MRN: [id number]   Date of Service  03/09/2019  HPI/Events of Note  Sinus tachy, got lopressor, then brady. Tachy brady on and off since last night.  Camera: Discussed with bed side RN. On Vent. Stable VS. In synchrony, sinus rhythm.   eICU Interventions  -get Mag/K level stat. Watch for now.         Elmer Sow 03/09/2019, 10:36 PM   00:13 Sinus brady 40, stable MAP. Get ECG. Watch for now. Get TSH.

## 2019-03-09 NOTE — Progress Notes (Addendum)
Patient HR increased to 140s-160s and sustained for approximately 5 minutes. Held metoprolol 5mg  as patient converted back to NSR before being given. Patient HR also went SB to 40-50s after episode. Patient had another episode at approximately 2040 and sustained in 150s. Metop 5mg  given and patient converted back to NSR within 2 minutes. Patient also converted to atrial fibrillation afterwards.   During episodes, patient legs are restless in bed and copious oral secretions. Suctioned mouth both episodes.  0030 Patient HR in 40-60s. Jake Samples, MD ordered EKG and TSH. EKG done and in chart, TSH sent to lab.  TSH within normal limits. Elink aware.

## 2019-03-09 NOTE — Progress Notes (Signed)
[place] Progress Note Patient Name: Rhonda Lynch DOB: 12-16-1955 MRN: [id number]   Date of Service  03/09/2019  HPI/Events of Note  Plenty of oral secretions. On HD for ESRD Cocaine abuse. On Vent. A fib.   Camera: In synchrony with vent. tachy. MAP good.  eICU Interventions  - Robinul 1mg  via NG tube ordered d once. Watch for arhythmia.      Intervention Category Intermediate Interventions: Other:  Elmer Sow 03/09/2019, 2:53 AM

## 2019-03-09 NOTE — Progress Notes (Signed)
Received call from Dr. Elsworth Soho that he still wants EEG done today after dialysis. Will check back with RN at later time to see if pt is done with dialysis.

## 2019-03-09 NOTE — Progress Notes (Signed)
Spoke w RN - pt is on dialysis therefore EEG will be done tomorrow due to dialysis effects on EEG pattern.

## 2019-03-09 NOTE — Progress Notes (Signed)
Placed pt back on previous vent mode from wean. Pt was agitated with increased RR in the 40s. Pt is stable at this time. Rt will continue to monitor.

## 2019-03-09 NOTE — Procedures (Signed)
ELECTROENCEPHALOGRAM REPORT   Patient: Rhonda Lynch       Room #: 1O10R EEG No. ID: [id number] Age: 63 y.o.        Sex: female Referring Physician: Elsworth Soho Report Date:  03/09/2019        Interpreting Physician: Alexis Goodell  History: Rhonda Lynch is an 63 y.o. female with seizure-like activity  Medications:  Precedex, Rocaltrol, Neurontin, Apresoline, Insulin, Isordil, Solumedrol, Renvela, Zosyn  Conditions of Recording:  This is a 21 channel routine scalp EEG performed with bipolar and monopolar montages arranged in accordance to the international 10/20 system of electrode placement. One channel was dedicated to EKG recording.  The patient is in the intubated and sedated state.  Description:  The background activity consists of a moderate to high voltage mixture of of disorganized delta and theta activity.  At times some intermixed alpha activity is noted as well.  Intermittent periodic discharges of triphasic morphology are noted over both hemispheres independently during the recording.  This background is maintained throughout the recording.  No episodes of eye fluttering were captured.  Hyperventilation and ntermittent photic stimulation were not performed.  IMPRESSION: This is an abnormal electroencephalogram due to the presence of general background slowing and disorganization with bilateral, independent triphasic waves.  This are seen most commonly in encephalopathic states.    Alexis Goodell, MD Neurology [phone] 03/09/2019, 4:07 PM

## 2019-03-09 NOTE — Progress Notes (Signed)
EEG Completed:  Pending Results

## 2019-03-09 NOTE — Progress Notes (Signed)
LTM s/u   Dr Cheral Marker notified

## 2019-03-09 NOTE — Progress Notes (Signed)
Patient has had copious secretions orally and nasally. Difficult to keep under control. Elink called and updated. Patient appears to become tachycardic and dyssynchronous with vent when secretions are not cleared well. Will continue to monitor.

## 2019-03-09 NOTE — Progress Notes (Addendum)
Patient afib HR was brady 47-50s and began increasing to 160s again. Called Elink and updated on situation. Elink camered in and patient converted to NSR. Verbal order to get mag and potassium. After order, patient HR increased again to 150-160s. Elink camered in and is aware. Will continue to monitor and report lab values once received.  2348 update: Mag 2.3 Potassium 4.0

## 2019-03-10 ENCOUNTER — Inpatient Hospital Stay (HOSPITAL_COMMUNITY): Payer: Medicare Other

## 2019-03-10 LAB — TSH: TSH: 1.776 u[IU]/mL (ref 0.350–4.500)

## 2019-03-10 LAB — TYPE AND SCREEN
ABO/RH(D): A POS
Antibody Screen: POSITIVE
DAT, IgG: POSITIVE
Donor AG Type: NEGATIVE
Donor AG Type: NEGATIVE
Unit division: 0
Unit division: 0

## 2019-03-10 LAB — GLUCOSE, CAPILLARY
Glucose-Capillary: 177 mg/dL — ABNORMAL HIGH (ref 70–99)
Glucose-Capillary: 160 mg/dL — ABNORMAL HIGH (ref 70–99)
Glucose-Capillary: 154 mg/dL — ABNORMAL HIGH (ref 70–99)
Glucose-Capillary: 186 mg/dL — ABNORMAL HIGH (ref 70–99)
Glucose-Capillary: 172 mg/dL — ABNORMAL HIGH (ref 70–99)

## 2019-03-10 LAB — CBC
HCT: 26.8 % — ABNORMAL LOW (ref 36.0–46.0)
Hemoglobin: 8.4 g/dL — ABNORMAL LOW (ref 12.0–15.0)
MCH: 22.8 pg — ABNORMAL LOW (ref 26.0–34.0)
MCHC: 31.3 g/dL (ref 30.0–36.0)
MCV: 72.6 fL — ABNORMAL LOW (ref 80.0–100.0)
Platelets: 297 10*3/uL (ref 150–400)
RBC: 3.69 MIL/uL — ABNORMAL LOW (ref 3.87–5.11)
RDW: 24.4 % — ABNORMAL HIGH (ref 11.5–15.5)
WBC: 16.2 10*3/uL — ABNORMAL HIGH (ref 4.0–10.5)
nRBC: 0.3 % — ABNORMAL HIGH (ref 0.0–0.2)

## 2019-03-10 LAB — BPAM RBC
Blood Product Expiration Date: 202007242359
Blood Product Expiration Date: 202007242359
ISSUE DATE / TIME: 202006261652
ISSUE DATE / TIME: 202006261652
Unit Type and Rh: 6200
Unit Type and Rh: 6200

## 2019-03-10 LAB — BASIC METABOLIC PANEL
Anion gap: 15 (ref 5–15)
BUN: 63 mg/dL — ABNORMAL HIGH (ref 8–23)
CO2: 25 mmol/L (ref 22–32)
Calcium: 8.7 mg/dL — ABNORMAL LOW (ref 8.9–10.3)
Chloride: 93 mmol/L — ABNORMAL LOW (ref 98–111)
Creatinine, Ser: 5.46 mg/dL — ABNORMAL HIGH (ref 0.44–1.00)
GFR calc Af Amer: 9 mL/min — ABNORMAL LOW (ref >60)
GFR calc non Af Amer: 8 mL/min — ABNORMAL LOW (ref >60)
Glucose, Bld: 198 mg/dL — ABNORMAL HIGH (ref 70–99)
Potassium: 4.3 mmol/L (ref 3.5–5.1)
Sodium: 133 mmol/L — ABNORMAL LOW (ref 135–145)

## 2019-03-10 LAB — PROCALCITONIN: Procalcitonin: 1.99 ng/mL

## 2019-03-10 MED ORDER — CHLORHEXIDINE GLUCONATE CLOTH 2 % EX PADS
6.0000 | MEDICATED_PAD | Freq: Every day | CUTANEOUS | Status: DC
  Administered 2019-03-10: 6 via TOPICAL

## 2019-03-10 MED ORDER — METOPROLOL TARTRATE 25 MG/10 ML ORAL SUSPENSION
12.5000 mg | Freq: Two times a day (BID) | ORAL | Status: DC
  Administered 2019-03-10 – 2019-03-13 (×7): 12.5 mg
  Filled 2019-03-10 (×8): qty 5

## 2019-03-10 MED ORDER — METHYLPREDNISOLONE SODIUM SUCC 40 MG IJ SOLR
40.0000 mg | Freq: Every day | INTRAMUSCULAR | Status: DC
  Administered 2019-03-11 – 2019-03-12 (×2): 40 mg via INTRAVENOUS
  Filled 2019-03-10 (×3): qty 1

## 2019-03-10 MED ORDER — ASPIRIN 81 MG PO CHEW
81.0000 mg | CHEWABLE_TABLET | Freq: Every day | ORAL | Status: DC
  Administered 2019-03-10 – 2019-03-13 (×4): 81 mg
  Filled 2019-03-10 (×4): qty 1

## 2019-03-10 NOTE — Progress Notes (Signed)
LTM discontinued;  No skin breakdown

## 2019-03-10 NOTE — Progress Notes (Signed)
[place] Progress Note Patient Name: Rhonda Lynch DOB: 18-Jun-1956 MRN: [id number]   Date of Service  03/10/2019  HPI/Events of Note  ECG reviewed. Sinus brady. Non specific ST T changes. Labs ok.   eICU Interventions  Continue to monitor for now.      Intervention Category Intermediate Interventions: Diagnostic test evaluation  Elmer Sow 03/10/2019, 1:10 AM

## 2019-03-10 NOTE — Progress Notes (Addendum)
Subjective: Improved level of consciousness today.   Objective: Current vital signs: BP 121/62   Pulse 62   Temp 97.7 F (36.5 C) (Oral)   Resp 18   Wt 54.5 kg   SpO2 100%   BMI 22.70 kg/m  Vital signs in last 24 hours: Temp:  [97.7 F (36.5 C)-98.1 F (36.7 C)] 97.7 F (36.5 C) (06/30 0400) Pulse Rate:  [45-180] 62 (06/30 0725) Resp:  [13-26] 18 (06/30 0725) BP: (99-177)/(58-96) 121/62 (06/30 0725) SpO2:  [99 %-100 %] 100 % (06/30 0725) FiO2 (%):  [30 %] 30 % (06/30 0725) Weight:  [52.4 kg-54.5 kg] 54.5 kg (06/30 0500)  Intake/Output from previous day: 06/29 0701 - 06/30 0700 In: 1132.5 [I.V.:113; NG/GT:920; IV Piggyback:99.5] Out: 2000  Intake/Output this shift: No intake/output data recorded. Nutritional status:  Diet Order            Diet NPO time specified  Diet effective now              Neurological exam: Mental Status: Eyes open spontaneously but does not track visual stimuli. Not following commands. No attempts to communicate. Does not gaze away from or towards visual stimuli. Thrashes RUE and RLE semipurposefully to light noxious stimuli, which represents an improvement relative to yesterday's exam.  Cranial Nerves: II: No blink to threat. Pupils 3 mm, round and sluggishly reactive to light III,IV, VI: Eyes are disconjugate with left exotropia noted. Weak doll's eye reflex on right, no doll's eye reflex on the left.   V: Noxious stimuli deferred VII: Face with decreased tone. Difficult to assess for asymmetry while intubated.   VIII: Does not respond to voice X: Intubated XI: Unable to assess shoulder shrug XII: UTA due to ETT Motor: RUE with rigid flexor and extensor tone. Moves to noxious stimuli semipurposefully with thrashing movements that are antigravity; able to resist examiner during these movements.  RLE with brisk withdrawal and thrashing movements to plantar stimulation.  LUE flaccid with no movement to any stimuli. LLE with flaccid tone  and minimal dorsiflexion of foot to noxious stimuli  Sensory: R hemibody- withdraws briskly to pinch on that side and also moves with pinch to LUE and plantar stimulation of LLE.  Deep Tendon Reflexes:  Brisk throughout with asymmetry noted.  Cerebellar/Gait: Unable to assess  Lab Results: Results for orders placed or performed during the hospital encounter of 02/10/2019 (from the past 48 hour(s))  Procalcitonin - Baseline     Status: None   Collection Time: 03/08/19  9:02 AM  Result Value Ref Range   Procalcitonin 2.87 ng/mL    Comment:        Interpretation: PCT > 2 ng/mL: Systemic infection (sepsis) is likely, unless other causes are known. (NOTE)       Sepsis PCT Algorithm           Lower Respiratory Tract                                      Infection PCT Algorithm    ----------------------------     ----------------------------         PCT < 0.25 ng/mL                PCT < 0.10 ng/mL         Strongly encourage             Strongly discourage   discontinuation of  antibiotics    initiation of antibiotics    ----------------------------     -----------------------------       PCT 0.25 - 0.50 ng/mL            PCT 0.10 - 0.25 ng/mL               OR       >80% decrease in PCT            Discourage initiation of                                            antibiotics      Encourage discontinuation           of antibiotics    ----------------------------     -----------------------------         PCT >= 0.50 ng/mL              PCT 0.26 - 0.50 ng/mL               AND       <80% decrease in PCT              Encourage initiation of                                             antibiotics       Encourage continuation           of antibiotics    ----------------------------     -----------------------------        PCT >= 0.50 ng/mL                  PCT > 0.50 ng/mL               AND         increase in PCT                  Strongly encourage                                       initiation of antibiotics    Strongly encourage escalation           of antibiotics                                     -----------------------------                                           PCT <= 0.25 ng/mL                                                 OR                                        >  80% decrease in PCT                                     Discontinue / Do not initiate                                             antibiotics Performed at [facility], [place] [address], [place], [place] [address]   Glucose, capillary     Status: Abnormal   Collection Time: 03/08/19 11:02 AM  Result Value Ref Range   Glucose-Capillary 155 (H) 70 - 99 mg/dL  Glucose, capillary     Status: Abnormal   Collection Time: 03/08/19  3:15 PM  Result Value Ref Range   Glucose-Capillary 198 (H) 70 - 99 mg/dL  Glucose, capillary     Status: Abnormal   Collection Time: 03/08/19  7:20 PM  Result Value Ref Range   Glucose-Capillary 157 (H) 70 - 99 mg/dL   Comment 1 Notify RN   Glucose, capillary     Status: Abnormal   Collection Time: 03/08/19 11:14 PM  Result Value Ref Range   Glucose-Capillary 219 (H) 70 - 99 mg/dL   Comment 1 Notify RN   Glucose, capillary     Status: Abnormal   Collection Time: 03/09/19  3:51 AM  Result Value Ref Range   Glucose-Capillary 233 (H) 70 - 99 mg/dL   Comment 1 Notify RN   Procalcitonin     Status: None   Collection Time: 03/09/19  4:42 AM  Result Value Ref Range   Procalcitonin 2.30 ng/mL    Comment:        Interpretation: PCT > 2 ng/mL: Systemic infection (sepsis) is likely, unless other causes are known. (NOTE)       Sepsis PCT Algorithm           Lower Respiratory Tract                                      Infection PCT Algorithm    ----------------------------     ----------------------------         PCT < 0.25 ng/mL                PCT < 0.10 ng/mL         Strongly encourage             Strongly discourage   discontinuation of antibiotics     initiation of antibiotics    ----------------------------     -----------------------------       PCT 0.25 - 0.50 ng/mL            PCT 0.10 - 0.25 ng/mL               OR       >80% decrease in PCT            Discourage initiation of                                            antibiotics      Encourage discontinuation  of antibiotics    ----------------------------     -----------------------------         PCT >= 0.50 ng/mL              PCT 0.26 - 0.50 ng/mL               AND       <80% decrease in PCT              Encourage initiation of                                             antibiotics       Encourage continuation           of antibiotics    ----------------------------     -----------------------------        PCT >= 0.50 ng/mL                  PCT > 0.50 ng/mL               AND         increase in PCT                  Strongly encourage                                      initiation of antibiotics    Strongly encourage escalation           of antibiotics                                     -----------------------------                                           PCT <= 0.25 ng/mL                                                 OR                                        > 80% decrease in PCT                                     Discontinue / Do not initiate                                             antibiotics Performed at [facility], [address] [place], [place] [address]   CBC     Status: Abnormal   Collection Time: 03/09/19  4:42 AM  Result Value Ref Range   WBC 16.9 (H) 4.0 - 10.5 K/uL   RBC  3.80 (L) 3.87 - 5.11 MIL/uL   Hemoglobin 8.7 (L) 12.0 - 15.0 g/dL    Comment: Reticulocyte Hemoglobin testing may be clinically indicated, consider ordering this additional test [id number]    HCT 27.8 (L) 36.0 - 46.0 %   MCV 73.2 (L) 80.0 - 100.0 fL   MCH 22.9 (L) 26.0 - 34.0 pg   MCHC 31.3 30.0 - 36.0 g/dL   RDW 24.3 (H) 11.5 - 15.5 %   Platelets 318  150 - 400 K/uL    Comment: REPEATED TO VERIFY   nRBC 0.3 (H) 0.0 - 0.2 %    Comment: Performed at [facility], [place] [address], [place], [place] [address]  Basic metabolic panel     Status: Abnormal   Collection Time: 03/09/19  4:42 AM  Result Value Ref Range   Sodium 135 135 - 145 mmol/L   Potassium 4.5 3.5 - 5.1 mmol/L   Chloride 93 (L) 98 - 111 mmol/L   CO2 22 22 - 32 mmol/L   Glucose, Bld 217 (H) 70 - 99 mg/dL   BUN 94 (H) 8 - 23 mg/dL   Creatinine, Ser 7.89 (H) 0.44 - 1.00 mg/dL   Calcium 8.6 (L) 8.9 - 10.3 mg/dL   GFR calc non Af Amer 5 (L) >60 mL/min   GFR calc Af Amer 6 (L) >60 mL/min   Anion gap 20 (H) 5 - 15    Comment: Performed at [facility] [address], [place], [place] [address]  Iron and TIBC     Status: None   Collection Time: 03/09/19  4:42 AM  Result Value Ref Range   Iron 37 28 - 170 ug/dL   TIBC 251 250 - 450 ug/dL   Saturation Ratios 15 10.4 - 31.8 %   UIBC 214 ug/dL    Comment: Performed at [facility], [place] [address] [place], [place] [address]  Glucose, capillary     Status: Abnormal   Collection Time: 03/09/19  7:06 AM  Result Value Ref Range   Glucose-Capillary 159 (H) 70 - 99 mg/dL  Glucose, capillary     Status: Abnormal   Collection Time: 03/09/19 11:03 AM  Result Value Ref Range   Glucose-Capillary 131 (H) 70 - 99 mg/dL  Glucose, capillary     Status: Abnormal   Collection Time: 03/09/19  3:26 PM  Result Value Ref Range   Glucose-Capillary 205 (H) 70 - 99 mg/dL  Glucose, capillary     Status: Abnormal   Collection Time: 03/09/19  7:20 PM  Result Value Ref Range   Glucose-Capillary 134 (H) 70 - 99 mg/dL  Magnesium     Status: None   Collection Time: 03/09/19 10:28 PM  Result Value Ref Range   Magnesium 2.3 1.7 - 2.4 mg/dL    Comment: Performed at [facility], [place] [address], [place], [place] [address]  Potassium     Status: None   Collection Time: 03/09/19 10:28 PM  Result Value Ref Range   Potassium 4.0  3.5 - 5.1 mmol/L    Comment: Performed at [facility] [address], [place], [place] [address]  Glucose, capillary     Status: Abnormal   Collection Time: 03/09/19 11:29 PM  Result Value Ref Range   Glucose-Capillary 218 (H) 70 - 99 mg/dL  TSH     Status: None   Collection Time: 03/10/19 12:41 AM  Result Value Ref Range   TSH 1.776 0.350 -  4.500 uIU/mL    Comment: Performed by a 3rd Generation assay with a functional sensitivity of <=0.01 uIU/mL. Performed at [facility], [place] [address], [place], [place] [address]   Glucose, capillary     Status: Abnormal   Collection Time: 03/10/19  4:24 AM  Result Value Ref Range   Glucose-Capillary 177 (H) 70 - 99 mg/dL  Procalcitonin     Status: None   Collection Time: 03/10/19  4:41 AM  Result Value Ref Range   Procalcitonin 1.99 ng/mL    Comment:        Interpretation: PCT > 0.5 ng/mL and <= 2 ng/mL: Systemic infection (sepsis) is possible, but other conditions are known to elevate PCT as well. (NOTE)       Sepsis PCT Algorithm           Lower Respiratory Tract                                      Infection PCT Algorithm    ----------------------------     ----------------------------         PCT < 0.25 ng/mL                PCT < 0.10 ng/mL         Strongly encourage             Strongly discourage   discontinuation of antibiotics    initiation of antibiotics    ----------------------------     -----------------------------       PCT 0.25 - 0.50 ng/mL            PCT 0.10 - 0.25 ng/mL               OR       >80% decrease in PCT            Discourage initiation of                                            antibiotics      Encourage discontinuation           of antibiotics    ----------------------------     -----------------------------         PCT >= 0.50 ng/mL              PCT 0.26 - 0.50 ng/mL                AND       <80% decrease in PCT             Encourage initiation of                                              antibiotics       Encourage continuation           of antibiotics    ----------------------------     -----------------------------        PCT >= 0.50 ng/mL                  PCT > 0.50 ng/mL  AND         increase in PCT                  Strongly encourage                                      initiation of antibiotics    Strongly encourage escalation           of antibiotics                                     -----------------------------                                           PCT <= 0.25 ng/mL                                                 OR                                        > 80% decrease in PCT                                     Discontinue / Do not initiate                                             antibiotics Performed at [facility], [address] [place], [place] [address]   CBC     Status: Abnormal   Collection Time: 03/10/19  4:41 AM  Result Value Ref Range   WBC 16.2 (H) 4.0 - 10.5 K/uL   RBC 3.69 (L) 3.87 - 5.11 MIL/uL   Hemoglobin 8.4 (L) 12.0 - 15.0 g/dL    Comment: Reticulocyte Hemoglobin testing may be clinically indicated, consider ordering this additional test [id number]    HCT 26.8 (L) 36.0 - 46.0 %   MCV 72.6 (L) 80.0 - 100.0 fL   MCH 22.8 (L) 26.0 - 34.0 pg   MCHC 31.3 30.0 - 36.0 g/dL   RDW 24.4 (H) 11.5 - 15.5 %   Platelets 297 150 - 400 K/uL    Comment: REPEATED TO VERIFY   nRBC 0.3 (H) 0.0 - 0.2 %    Comment: Performed at [facility] [address], [place], [place] [address]  Basic metabolic panel     Status: Abnormal   Collection Time: 03/10/19  4:41 AM  Result Value Ref Range   Sodium 133 (L) 135 - 145 mmol/L   Potassium 4.3 3.5 - 5.1 mmol/L   Chloride 93 (L) 98 - 111 mmol/L   CO2 25 22 - 32 mmol/L   Glucose, Bld 198 (H) 70 - 99 mg/dL   BUN 63 (H) 8 - 23 mg/dL   Creatinine, Ser 5.46 (H) 0.44 -  1.00 mg/dL   Calcium 8.7 (L) 8.9 - 10.3 mg/dL   GFR calc non Af Amer 8 (L) >60 mL/min   GFR calc Af  Amer 9 (L) >60 mL/min   Anion gap 15 5 - 15    Comment: Performed at [facility] [address], [place], [place] [address]  Glucose, capillary     Status: Abnormal   Collection Time: 03/10/19  7:05 AM  Result Value Ref Range   Glucose-Capillary 160 (H) 70 - 99 mg/dL    Recent Results (from the past 240 hour(s))  SARS Coronavirus 2 (CEPHEID - Performed in [facility]), Hosp Order     Status: None   Collection Time: 02/18/2019 12:56 PM   Specimen: Nasopharyngeal Swab  Result Value Ref Range Status   SARS Coronavirus 2 NEGATIVE NEGATIVE Final    Comment: (NOTE) If result is NEGATIVE SARS-CoV-2 target nucleic acids are NOT DETECTED. The SARS-CoV-2 RNA is generally detectable in upper and lower  respiratory specimens during the acute phase of infection. The lowest  concentration of SARS-CoV-2 viral copies this assay can detect is 250  copies / mL. A negative result does not preclude SARS-CoV-2 infection  and should not be used as the sole basis for treatment or other  patient management decisions.  A negative result may occur with  improper specimen collection / handling, submission of specimen other  than nasopharyngeal swab, presence of viral mutation(s) within the  areas targeted by this assay, and inadequate number of viral copies  (<250 copies / mL). A negative result must be combined with clinical  observations, patient history, and epidemiological information. If result is POSITIVE SARS-CoV-2 target nucleic acids are DETECTED. The SARS-CoV-2 RNA is generally detectable in upper and lower  respiratory specimens dur ing the acute phase of infection.  Positive  results are indicative of active infection with SARS-CoV-2.  Clinical  correlation with patient history and other diagnostic information is  necessary to determine patient infection status.  Positive results do  not rule out bacterial infection or co-infection with other viruses. If result is  PRESUMPTIVE POSTIVE SARS-CoV-2 nucleic acids MAY BE PRESENT.   A presumptive positive result was obtained on the submitted specimen  and confirmed on repeat testing.  While 2019 novel coronavirus  (SARS-CoV-2) nucleic acids may be present in the submitted sample  additional confirmatory testing may be necessary for epidemiological  and / or clinical management purposes  to differentiate between  SARS-CoV-2 and other Sarbecovirus currently known to infect humans.  If clinically indicated additional testing with an alternate test  methodology [id number]) is advised. The SARS-CoV-2 RNA is generally  detectable in upper and lower respiratory sp ecimens during the acute  phase of infection. The expected result is Negative. Fact Sheet for Patients:  StrictlyIdeas.no Fact Sheet for Healthcare Providers: BankingDealers.co.za This test is not yet approved or cleared by the [place] FDA and has been authorized for detection and/or diagnosis of SARS-CoV-2 by FDA under an Emergency Use Authorization (EUA).  This EUA will remain in effect (meaning this test can be used) for the duration of the COVID-19 declaration under Section 564(b)(1) of the Act, 21 U.S.C. section 360bbb-3(b)(1), unless the authorization is terminated or revoked sooner. Performed at [facility], [place] [address] [place], [place] [address]   Culture, blood (routine x 2)     Status: None (Preliminary result)   Collection Time: 03/05/19 10:58 PM   Specimen: BLOOD  Result Value Ref Range  Status   Specimen Description BLOOD LEFT ANTECUBITAL  Final   Special Requests   Final    BOTTLES DRAWN AEROBIC ONLY Blood Culture adequate volume   Culture   Final    NO GROWTH 4 DAYS Performed at [facility], [address], [place], [place] [address]    Report Status PENDING  Incomplete  Culture, blood (Routine X 2) w Reflex to ID Panel     Status: None (Preliminary result)    Collection Time: 03/05/19 11:05 PM   Specimen: BLOOD  Result Value Ref Range Status   Specimen Description BLOOD SITE NOT SPECIFIED  Final   Special Requests   Final    BOTTLES DRAWN AEROBIC ONLY Blood Culture adequate volume   Culture   Final    NO GROWTH 4 DAYS Performed at [facility], [address] [place], [place] [address]    Report Status PENDING  Incomplete  MRSA PCR Screening     Status: None   Collection Time: 03/06/19  6:57 PM   Specimen: Nasal Mucosa; Nasopharyngeal  Result Value Ref Range Status   MRSA by PCR NEGATIVE NEGATIVE Final    Comment:        The GeneXpert MRSA Assay (FDA approved for NASAL specimens only), is one component of a comprehensive MRSA colonization surveillance program. It is not intended to diagnose MRSA infection nor to guide or monitor treatment for MRSA infections. Performed at [facility], [place] [address] [place], [place] [address]   Culture, respiratory (non-expectorated)     Status: None   Collection Time: 03/06/19  8:21 PM   Specimen: Tracheal Aspirate; Respiratory  Result Value Ref Range Status   Specimen Description TRACHEAL ASPIRATE  Final   Special Requests NONE  Final   Gram Stain NO WBC SEEN NO ORGANISMS SEEN   Final   Culture   Final    FEW Consistent with normal respiratory flora. Performed at [facility], [place] [address], [place], [place] [address]    Report Status 03/09/2019 FINAL  Final    Lipid Panel No results for input(s): CHOL, TRIG, HDL, CHOLHDL, VLDL, LDLCALC in the last 72 hours.  Studies/Results: Dg Chest Port 1 View  Result Date: 03/10/2019 CLINICAL DATA:  Respiratory failure. EXAM: PORTABLE CHEST 1 VIEW COMPARISON:  03/09/2019. FINDINGS: Endotracheal tube, NG tube, right IJ line, and dual-lumen right IJ catheter stable position. Stable cardiomegaly. Continued slight improvement interstitial prominence. No pleural effusion noted on today's exam. IMPRESSION: 1.  Lines and tubes in  stable position. 2. Stable cardiomegaly. Continued slight improvement in interstitial prominence. Electronically Signed   By: Marcello Moores  Register   On: 03/10/2019 06:35   Dg Chest Port 1 View  Result Date: 03/09/2019 CLINICAL DATA:  Respiratory failure. EXAM: PORTABLE CHEST 1 VIEW COMPARISON:  03/08/2019. FINDINGS: Endotracheal tube, NG tube, right IJ and right subclavian central line stable position. Stable cardiomegaly. Partial clearing of bilateral pulmonary interstitial prominence suggesting clearing interstitial edema. Tiny right pleural effusion cannot be excluded. No pneumothorax. IMPRESSION: 1.  Lines and tubes in stable position. 2. Stable cardiomegaly. Partial clearing of bilateral pulmonary interstitial prominence suggesting clearing interstitial edema. Tiny right pleural effusion cannot be excluded. Electronically Signed   By: Marcello Moores  Register   On: 03/09/2019 06:31    Medications:  Scheduled: . sodium chloride   Intravenous Once  . aspirin EC  81 mg Oral Daily  . calcitRIOL  0.25 mcg Oral Q T,Th,Sat-1800  . chlorhexidine gluconate (MEDLINE  KIT)  15 mL Mouth Rinse BID  . Chlorhexidine Gluconate Cloth  6 each Topical Q0600  . darbepoetin (ARANESP) injection - NON-DIALYSIS  100 mcg Subcutaneous Q Mon-1800  . feeding supplement (NEPRO CARB STEADY)  1,000 mL Per Tube Q24H  . feeding supplement (PRO-STAT SUGAR FREE 64)  30 mL Per Tube BID  . gabapentin  100 mg Oral TID  . heparin  5,000 Units Subcutaneous Q8H  . hydrALAZINE  50 mg Per Tube TID  . insulin aspart  0-15 Units Subcutaneous Q4H  . insulin glargine  10 Units Subcutaneous Daily  . isosorbide dinitrate  20 mg Oral BID  . mouth rinse  15 mL Mouth Rinse 10 times per day  . methylPREDNISolone (SOLU-MEDROL) injection  40 mg Intravenous Q12H  . nicotine  14 mg Transdermal Daily  . pantoprazole sodium  40 mg Per Tube Daily  . sevelamer carbonate  1.6 g Per Tube TID WC  . sodium chloride flush  3 mL Intravenous Q12H    Continuous: . dexmedetomidine (PRECEDEX) IV infusion Stopped (03/08/19 0711)  . piperacillin-tazobactam (ZOSYN)  IV Stopped (03/09/19 2329)    Assessment: 63 year old female with hx of substance abuse, prior CVA, ESRD, DM2, and Hep C, admitted for acute hypoxic respiratory failure secondary to bilateral pneumonia. Patient has been difficult to arouse and notably lethargic, noted to have fluttering eyelid movements and L sided flaccidity. 1. 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 2. Spot EEG without seizures, but with generalized background slowing with bilateral independent triphasic waves, most consistent with an encephalopathic state. Of note, these findings do not rule out intermittent seizure activity not captured on the spot EEG. 3. CT head reveals no acute intracranial pathology. Small-vessel white matter disease is noted.   Recommendations: - No clinical seizures overnight and responsiveness is improving. Discontinuing LTM for MRI brain.  - Hold starting anti-convulsant meds unless official LTM report for today documents seizure activity - Neurology will continue to follow with you  35 minutes spent in the neurological evaluation and management of this critically ill patient.   LOS: 5 days   '@Electronically'$  signed: Dr. Kerney Elbe 03/10/2019  7:47 AM

## 2019-03-10 NOTE — Progress Notes (Addendum)
Name: Rhonda Lynch MRN: [id number] DOB: 1956-08-18    ADMISSION DATE:  02/20/2019 CONSULTATION DATE: 03/06/2019  REFERRING MD : [facility]  CHIEF COMPLAINT: Acute on chronic hypoxia  BRIEF PATIENT DESCRIPTION: 63 yo FM, ESRD, chronic cocaine use, tobacco use, admitted for hypoxic respiratory failure due to BL pna, CT imaging with peri-bronchovascular infiltrates, proximal perlymphatic and perihilar distribution with intralobular septal thickening and BL GGO.  On review, infiltrates dating back to 11/2018 on CT scans, CT abdomen from 10/2018 shows clear lungs Had respiratory arrest 6/26  SIGNIFICANT EVENTS  03/05/2019 hypoxic event 6/26 respiratory arrest on dialysis, did not lose pulse  STUDIES:  03/05/2019 CT scan again shows bilateral airspace disease and cardiomegaly 6/26 head CT neg  EEG 6/229 >> slow triphasic waves , no seizure   ETT 6/26 >> RIJ 6/26 >>   resp cx 6/26 > neg [facility] 6/26 >> neg   6/26 zosyn >> 6/30  PAST MEDICAL HISTORY :   has a past medical history of Diabetes mellitus without complication ([place]), ESRD (end stage renal disease) on dialysis ([place]), Hepatitis C, Hypertension, Oxygen deficiency, Stroke ([place]), and Substance abuse ([place]).  has a past surgical history that includes IR Fluoro Guide CV Line Right (02/18/2018); IR US Guide Vasc Access Right (02/18/2018); IR Fluoro Guide CV Line Right (02/26/2018); AV fistula placement (Left, 03/05/2018); Hematoma evacuation (Left, 03/06/2018); IR Removal Tun Cv Cath W/O FL (04/27/2018); TEE without cardioversion (N/A, 04/29/2018); Insertion of dialysis catheter (Left, 04/29/2018); AV fistula placement (Left, 07/21/2018); Bascilic vein transposition (Left, 09/29/2018); I&D extremity (Left, 10/06/2018); venogram (Left, 10/27/2018); AV fistula placement (Left, 10/27/2018); Thrombectomy and revision of arterioventous (av) goretex  graft (Left, 12/11/2018); IR US Guide Vasc Access Right (01/14/2019); IR Fluoro Guide CV Line Right (01/14/2019);  Insertion of dialysis catheter (Right, 01/19/2019); AV fistula placement (Right, 01/19/2019); IR THROMBECTOMY AV FISTULA W/THROMBOLYSIS INC/SHUNT/IMG RIGHT (02/20/2019); and IR US Guide Vasc Access Right (02/20/2019).    SUBJECTIVE:   Received 1 dose of fentanyl overnight, remains poorly responsive Episode of SVT yesterday resolved with 1 dose of Lopressor, mildly bradycardic overnight EEG obtained Remains intubated and critically ill Afebrile    VITAL SIGNS: Temp:  [97.7 F (36.5 C)-98.1 F (36.7 C)] 97.7 F (36.5 C) (06/30 0400) Pulse Rate:  [45-180] 62 (06/30 0725) Resp:  [13-26] 18 (06/30 0725) BP: (99-177)/(58-96) 121/62 (06/30 0725) SpO2:  [99 %-100 %] 100 % (06/30 0725) FiO2 (%):  [30 %] 30 % (06/30 0740) Weight:  [52.4 kg-54.5 kg] 54.5 kg (06/30 0500)  PHYSICAL EXAMINATION: Unchanged General: Chronically ill-appearing, orally intubated  Neuro: Does not follow commands, localizes and moves all 4 extremities to pain, RA SS -2 HEENT: No JVD or lymphadenopathy is appreciated.  Dry oral mucosa. Cardiovascular: Heart sounds are regular Lungs: Decreased in bases, bilateral scattered rhonchi Abdomen: Soft, nontender, hypoactive bowel sounds Musculoskeletal: Intact Skin: 1+ edema extremity  Chest x-ray 6/30 personally reviewed which shows improved bilateral infiltrates   Acute on chronic hypoxic respiratory failure  -  Bilateral infiltrates/diffuse lung disease -dates back to 11/2018, attributed to chronic lung, drug screen was positive for cocaine in 11/2018 not checked this admit Differential includes ILD less likely and pulmonary edema due to Volume overload, BOOP  -Ct spontaneous breathing trials -tolerates pressure support 10/5 with low tidal volumes -Decrease empiric Solu-Medrol 40 every 24-can discontinue soon -Okay to stop Zosyn today after 5 days,  resp cx neg, procalcitonin slight high  Acute systolic/diastolic heart failure Echo 01/2019 shows EF 45%, RVSP 49  Elevated  troponin -Aspirin daily, titrate hydralazine/ nitrates as blood pressure tolerates -6/29 episode of SVT resolved with Lopressor, hold off standing beta-blockade due to some bradycardia overnight   Acute encephalopathy with history of CVA - head CT was negative -Minimize fentanyl, goal RA SS 0 to -1 -ct LTM EEG today, MRI planned to ensure no stroke was missed on initial CT  ESRD -dialysis per renal  Diabetes, insulin-requiring -SSI  -ct  Lantus 10 U, decreasing steroids would help   Family communication-updated son 6/29, guarded prognosis given, he is aware of her drug abuse  The patient is critically ill with multiple organ systems failure and requires high complexity decision making for assessment and support, frequent evaluation and titration of therapies, application of advanced monitoring technologies and extensive interpretation of multiple databases. Critical Care Time devoted to patient care services described in this note independent of APP/resident  time is 32 minutes.      Kara Mead MD. Shade Flood. [facility] Pager [phone] If no response call [phone]    03/10/2019, 8:57 AM

## 2019-03-10 NOTE — Procedures (Signed)
  Electroencephalogram report- LTM with VIDEO  Beginning time: 03/09/19 at 14 22 Ending time:  03/10/19 at 0730 CPT/type : 95720  Day of study: day 1    Technical Description: The EEG was performed using standard setting per the guidelines of American Clinical Neurophysiology Society (ACNS).    A minimum of 21 electrodes were placed on scalp according to the International 10-20 or/and 10-10 Systems. Supplemental electrodes were placed as needed. Single EKG electrode was also used to detect cardiac arrhythmia. Patient's behavior was continuously recorded on video simultaneously with EEG. A minimum of 16 channels were used for data display. Each epoch of study was reviewed manually daily and as needed using standard referential and bipolar montages. Computerized quantitative EEG analysis (such as compressed spectral array analysis, trending, automated spike & seizure detection) were used as indicated.    Spike detection: ON  Seizure detection: ON   This  continuous  EEG monitoring with simultaneous video monitoring was performed for this patient with spells concerning for seizures to rule out clinical and subclinical seizures.  Medications: As per EMR  There was no pushbutton activations events during this recording.  Automated spike detection program did not detect any spikes. Seizure detection program did not detect any seizures .  Background activities marked by continuous, reactive background activities with state changes mostly in the delta and theta range with 5 to 6 cps posterior dominant disorganized rhythm better developed on the left as opposed to right.  At times appeared to be more prominent slowing present across right hemisphere although asymmetry is rather mild.   There was no epileptiform discharges,  clinical or subclinical seizures present.    EKG demonstrated tachycardia at 180 bpm during first several hours of the recording with subsequent resolution.  At the end of the  recording persistent bradycardia arrhythmia was noted  Clinical interpretation: This day 1 of continuous EEG monitoring with simultaneous video monitoring did not record any clinical or subclinical seizures.  EEG was abnormal due to disorganized background activity slowing suggestive of moderate encephalopathy of nonspecific etiologies including toxic metabolic pharmacological multifocal degenerative etiologies.  Occasionally triphasic waves are present however were not a prominent findings throughout the recording.  At times appeared to be more slowing across right hemisphere although asymmetry was rather mild.  Abnormal EKG noted throughout the recording.  Please see above discussion.  If concern for seizures persist continuous monitoring is recommended.  Clinical correlation is advised.

## 2019-03-10 NOTE — Progress Notes (Signed)
Notified Dr Elsworth Soho of frequency of pts SVT runs, 5mg  metop given 1 hour ago - pt continues to go into SVT put comes out on own into SB/SR.  Pt hemodynamically stable.  MD to enter orders for PO meds.

## 2019-03-10 NOTE — Progress Notes (Signed)
[facility] ROUNDING NOTE   Subjective:   This is a very pleasant 63 year old lady that was admitted with shortness of breath pulmonary consolidation intubated by ICU was started on empiric antibiotics and IV steroids.  She was diagnosed with hypoxic respiratory failure due to bilateral pneumonia.  She is a Tuesday Thursday Saturday dialysis patient that is currently off schedule and is receiving dialysis Monday, 03/09/2019.  She has a right forearm AV graft and a hemodialysis catheter.  She has a history of cocaine abuse, tobacco abuse and emphysema.  She also has a history of hepatitis C.  Blood pressure 130/91 pulse 62 temperature 97.7 O2 sats 99% 30% FiO2  Sodium 133 potassium 4.3 chloride 93 CO2 25 BUN 63 creatinine 5.46 glucose 198 iron saturations 15% WBC 16.2 hemoglobin 8.4 platelets 297  Status post transfusion 2 units packed red blood cells 03/06/2019  Aspirin 81 mg daily, Nepro 1000 cc every 24 hours, Protostat 30 cc twice daily,hydralazine 25 mg 3 times daily, insulin sliding scale, Lantus 10 units nightly, isosorbide 10 mg twice daily, Solu-Medrol 40 mg every 12 hours, nicotine patch every 24 hours, Protonix 40 mg daily, Sevelamer 1.6 g 3 times daily, calcitriol 0.25 mcg Tuesday Thursday Saturday, darbepoetin 100 mcg subcu q. Monday  IV Zosyn 3.375 g every 12 hours,  Chest x-ray stable 03/10/2019 stable cardiomegaly slightly improved interstitial prominence 03/10/2019  2D echo 01/12/2019 EF 40 to 45% severely dilated left atrium and small pericardial effusion.  There is mild valvular thickening  CT scan of head 03/06/2019 no intracranial pathology  CT scan of chest 03/05/2019 small pericardial effusion new compared to previous study, dense peribronchial vascular [name] and consolidative airspace opacities bilaterally worsened since 02/03/2019 with underlying centrilobular emphysema small bilateral pleural effusions.  Abdominal x-ray OG tube projecting over the  patient's gastric body  Objective:  Vital signs in last 24 hours:  Temp:  [97.7 F (36.5 C)-98.1 F (36.7 C)] 97.7 F (36.5 C) (06/30 0400) Pulse Rate:  [45-180] 62 (06/30 0725) Resp:  [13-26] 18 (06/30 0725) BP: (99-165)/(58-96) 121/62 (06/30 0725) SpO2:  [99 %-100 %] 100 % (06/30 0725) FiO2 (%):  [30 %] 30 % (06/30 0740) Weight:  [52.4 kg-54.5 kg] 54.5 kg (06/30 0500)  Weight change: -2.3 kg Filed Weights   03/09/19 0900 03/09/19 1205 03/10/19 0500  Weight: 54.4 kg 52.4 kg 54.5 kg    Intake/Output: I/O last 3 completed shifts: In: 1654.3 [I.V.:123; NG/GT:1380; IV Piggyback:151.3] Out: 2000 [Other:2000]   Intake/Output this shift:  No intake/output data recorded. Intubated and sedated CVS- RRR RS-scattered rhonchorous breath sounds ventilator ABD- BS present soft non-distended OG tube EXT- no edema right AV graft with thrill bruit right [facility]   Basic Metabolic Panel: Recent Labs  Lab 03/06/19 0531  03/06/19 1901 03/06/19 2017 03/07/19 0326 03/08/19 0410 03/09/19 0442 03/09/19 2228 03/10/19 0441  NA 137   < > 134* 137 137 135 135  --  133*  K 3.5   < > 3.7 4.2 3.7 4.7 4.5 4.0 4.3  CL 97*   < > 98  --  100 96* 93*  --  93*  CO2 29   < > 25  --  _0 --  25  GLUCOSE 101*   < > 253*  --  144* 209* 217*  --  198*  BUN 18   < > 18  --  25* 59* 94*  --  63*  CREATININE 4.38*   < > 3.77*  --  4.71* 6.48* 7.89*  --  5.46*  CALCIUM 8.4*   < > 8.3*  --  8.8* 8.9 8.6*  --  8.7*  MG  --   --   --   --   --   --   --  2.3  --   PHOS 4.2  --   --   --  5.6*  --   --   --   --    < > = values in this interval not displayed.    Liver Function Tests: Recent Labs  Lab 02/27/2019 1049 03/06/19 0531 03/07/19 0326  AST 43*  --   --   ALT 23  --   --   ALKPHOS 110  --   --   BILITOT 0.8  --   --   PROT 7.8  --   --   ALBUMIN 2.1* 2.0* 2.0*   Recent Labs  Lab 02/18/2019 1049  LIPASE 46   No results for input(s): AMMONIA in the last 168 hours.  CBC: Recent Labs   Lab 02/17/2019 1049  03/07/19 0326 03/07/19 1925 03/08/19 0410 03/09/19 0442 03/10/19 0441  WBC 12.5*   < > 10.9* 10.9* 11.2* 16.9* 16.2*  NEUTROABS 9.2*  --   --   --   --   --   --   HGB 7.7*   < > 7.5* 7.5* 7.8* 8.7* 8.4*  HCT 25.6*   < > 24.3* 24.7* 25.2* 27.8* 26.8*  MCV 72.5*   < > 73.6* 74.8* 73.5* 73.2* 72.6*  PLT 288   < > 214 219 242 318 297   < > = values in this interval not displayed.    Cardiac Enzymes: No results for input(s): CKTOTAL, CKMB, CKMBINDEX, TROPONINI in the last 168 hours.  BNP: Invalid input(s): POCBNP  CBG: Recent Labs  Lab 03/09/19 1526 03/09/19 1920 03/09/19 2329 03/10/19 0424 03/10/19 0705  GLUCAP 205* 134* 218* 177* 160*    Microbiology: Results for orders placed or performed during the hospital encounter of 02/11/2019  SARS Coronavirus 2 (CEPHEID - Performed in [place] hospital lab), Hosp Order     Status: None   Collection Time: 02/14/2019 12:56 PM   Specimen: Nasopharyngeal Swab  Result Value Ref Range Status   SARS Coronavirus 2 NEGATIVE NEGATIVE Final    Comment: (NOTE) If result is NEGATIVE SARS-CoV-2 target nucleic acids are NOT DETECTED. The SARS-CoV-2 RNA is generally detectable in upper and lower  respiratory specimens during the acute phase of infection. The lowest  concentration of SARS-CoV-2 viral copies this assay can detect is 250  copies / mL. A negative result does not preclude SARS-CoV-2 infection  and should not be used as the sole basis for treatment or other  patient management decisions.  A negative result may occur with  improper specimen collection / handling, submission of specimen other  than nasopharyngeal swab, presence of viral mutation(s) within the  areas targeted by this assay, and inadequate number of viral copies  (<250 copies / mL). A negative result must be combined with clinical  observations, patient history, and epidemiological information. If result is POSITIVE SARS-CoV-2 target nucleic  acids are DETECTED. The SARS-CoV-2 RNA is generally detectable in upper and lower  respiratory specimens dur ing the acute phase of infection.  Positive  results are indicative of active infection with SARS-CoV-2.  Clinical  correlation with patient history and other diagnostic information is  necessary to determine patient infection status.  Positive results do  not rule out bacterial infection or co-infection with other viruses. If result is PRESUMPTIVE POSTIVE SARS-CoV-2 nucleic acids MAY BE PRESENT.   A presumptive positive result was obtained on the submitted specimen  and confirmed on repeat testing.  While 2019 novel coronavirus  (SARS-CoV-2) nucleic acids may be present in the submitted sample  additional confirmatory testing may be necessary for epidemiological  and / or clinical management purposes  to differentiate between  SARS-CoV-2 and other Sarbecovirus currently known to infect humans.  If clinically indicated additional testing with an alternate test  methodology [phone]) is advised. The SARS-CoV-2 RNA is generally  detectable in upper and lower respiratory sp ecimens during the acute  phase of infection. The expected result is Negative. Fact Sheet for Patients:  StrictlyIdeas.no Fact Sheet for Healthcare Providers: BankingDealers.co.za This test is not yet approved or cleared by the [place] FDA and has been authorized for detection and/or diagnosis of SARS-CoV-2 by FDA under an Emergency Use Authorization (EUA).  This EUA will remain in effect (meaning this test can be used) for the duration of the COVID-19 declaration under Section 564(b)(1) of the Act, 21 U.S.C. section 360bbb-3(b)(1), unless the authorization is terminated or revoked sooner. Performed at [facility], [place] [address] [place], [place] [address]   Culture, blood (routine x 2)     Status: None (Preliminary result)   Collection Time:  03/05/19 10:58 PM   Specimen: BLOOD  Result Value Ref Range Status   Specimen Description BLOOD LEFT ANTECUBITAL  Final   Special Requests   Final    BOTTLES DRAWN AEROBIC ONLY Blood Culture adequate volume   Culture   Final    NO GROWTH 4 DAYS Performed at [facility], [place] [address], [place], [place] [address]    Report Status PENDING  Incomplete  Culture, blood (Routine X 2) w Reflex to ID Panel     Status: None (Preliminary result)   Collection Time: 03/05/19 11:05 PM   Specimen: BLOOD  Result Value Ref Range Status   Specimen Description BLOOD SITE NOT SPECIFIED  Final   Special Requests   Final    BOTTLES DRAWN AEROBIC ONLY Blood Culture adequate volume   Culture   Final    NO GROWTH 4 DAYS Performed at [facility], [address], [place], [place] [address]    Report Status PENDING  Incomplete  MRSA PCR Screening     Status: None   Collection Time: 03/06/19  6:57 PM   Specimen: Nasal Mucosa; Nasopharyngeal  Result Value Ref Range Status   MRSA by PCR NEGATIVE NEGATIVE Final    Comment:        The GeneXpert MRSA Assay (FDA approved for NASAL specimens only), is one component of a comprehensive MRSA colonization surveillance program. It is not intended to diagnose MRSA infection nor to guide or monitor treatment for MRSA infections. Performed at [facility], [place] [address] [place], [place] [address]   Culture, respiratory (non-expectorated)     Status: None   Collection Time: 03/06/19  8:21 PM   Specimen: Tracheal Aspirate; Respiratory  Result Value Ref Range Status   Specimen Description TRACHEAL ASPIRATE  Final   Special Requests NONE  Final   Gram Stain NO WBC SEEN NO ORGANISMS SEEN   Final   Culture   Final    FEW Consistent with normal respiratory flora. Performed at [facility], [place] [address], [place], [place] [address]  Report Status 03/09/2019 FINAL  Final    Coagulation Studies: No results for input(s): LABPROT,  INR in the last 72 hours.  Urinalysis: No results for input(s): COLORURINE, LABSPEC, PHURINE, GLUCOSEU, HGBUR, BILIRUBINUR, KETONESUR, PROTEINUR, UROBILINOGEN, NITRITE, LEUKOCYTESUR in the last 72 hours.  Invalid input(s): APPERANCEUR    Imaging: Dg Chest Port 1 View  Result Date: 03/10/2019 CLINICAL DATA:  Respiratory failure. EXAM: PORTABLE CHEST 1 VIEW COMPARISON:  03/09/2019. FINDINGS: Endotracheal tube, NG tube, right IJ line, and dual-lumen right IJ catheter stable position. Stable cardiomegaly. Continued slight improvement interstitial prominence. No pleural effusion noted on today's exam. IMPRESSION: 1.  Lines and tubes in stable position. 2. Stable cardiomegaly. Continued slight improvement in interstitial prominence. Electronically Signed   By: Marcello Moores  Register   On: 03/10/2019 06:35   Dg Chest Port 1 View  Result Date: 03/09/2019 CLINICAL DATA:  Respiratory failure. EXAM: PORTABLE CHEST 1 VIEW COMPARISON:  03/08/2019. FINDINGS: Endotracheal tube, NG tube, right IJ and right subclavian central line stable position. Stable cardiomegaly. Partial clearing of bilateral pulmonary interstitial prominence suggesting clearing interstitial edema. Tiny right pleural effusion cannot be excluded. No pneumothorax. IMPRESSION: 1.  Lines and tubes in stable position. 2. Stable cardiomegaly. Partial clearing of bilateral pulmonary interstitial prominence suggesting clearing interstitial edema. Tiny right pleural effusion cannot be excluded. Electronically Signed   By: [place]   On: 03/09/2019 06:31     Medications:   . piperacillin-tazobactam (ZOSYN)  IV 3.375 g (03/10/19 0815)   . sodium chloride   Intravenous Once  . aspirin EC  81 mg Oral Daily  . calcitRIOL  0.25 mcg Oral Q T,Th,Sat-1800  . chlorhexidine gluconate (MEDLINE KIT)  15 mL Mouth Rinse BID  . Chlorhexidine Gluconate Cloth  6 each Topical Q0600  . darbepoetin (ARANESP) injection - NON-DIALYSIS  100 mcg Subcutaneous Q  Mon-1800  . feeding supplement (NEPRO CARB STEADY)  1,000 mL Per Tube Q24H  . feeding supplement (PRO-STAT SUGAR FREE 64)  30 mL Per Tube BID  . heparin  5,000 Units Subcutaneous Q8H  . hydrALAZINE  50 mg Per Tube TID  . insulin aspart  0-15 Units Subcutaneous Q4H  . insulin glargine  10 Units Subcutaneous Daily  . isosorbide dinitrate  20 mg Oral BID  . mouth rinse  15 mL Mouth Rinse 10 times per day  . [START ON 03/11/2019] methylPREDNISolone (SOLU-MEDROL) injection  40 mg Intravenous Daily  . nicotine  14 mg Transdermal Daily  . pantoprazole sodium  40 mg Per Tube Daily  . sevelamer carbonate  1.6 g Per Tube TID WC  . sodium chloride flush  3 mL Intravenous Q12H   calcium carbonate (dosed in mg elemental calcium), camphor-menthol **AND** [DISCONTINUED] hydrOXYzine, docusate sodium, fentaNYL (SUBLIMAZE) injection, heparin, ipratropium-albuterol, metoprolol tartrate, ondansetron **OR** ondansetron (ZOFRAN) IV, polyethylene glycol, sorbitol  Assessment/ Plan:   ESRD-Tuesday Thursday Saturday dialysis patient she is currently off schedule.  She receives dialysis 03/09/2019 with removal of 2 L.  Next dialysis treatment will be scheduled 03/11/2019.  She has a AV graft and hemodialysis catheter  Anemia status post transfusion x2 units 03/06/2019 last iron saturations 15%.  We will continue to follow.  Started darbepoetin 100 mcg weekly  Bones.  Continues on sevelamer binders.  Phosphorus reasonable at this point.  Calcium controlled.  She also continues on calcitriol 0.25 mcg daily  Hypertension.  Continues on antihypertensive medications.  Next dialysis treatment 03/11/2019  Congestive heart failure systolic dysfunction EF 40 to 45%  Pneumonia continues on  Zosyn IV  Emphysema IV Solu-Medrol.  Diabetes mellitus as per primary team   Nutrition continues on Protostat twice daily and Nepro 1 L per 24 hours  History of hepatitis C  History of polysubstance abuse  Encephalopathy with a  history of CVA in the past negative CT scan of head plan for EEG.   LOS: [place] _0 _1 :04 AM

## 2019-03-11 ENCOUNTER — Inpatient Hospital Stay (HOSPITAL_COMMUNITY): Payer: Medicare Other

## 2019-03-11 DIAGNOSIS — I4891 Unspecified atrial fibrillation: Secondary | ICD-10-CM

## 2019-03-11 DIAGNOSIS — I639 Cerebral infarction, unspecified: Secondary | ICD-10-CM

## 2019-03-11 LAB — GLUCOSE, CAPILLARY
Glucose-Capillary: 116 mg/dL — ABNORMAL HIGH (ref 70–99)
Glucose-Capillary: 118 mg/dL — ABNORMAL HIGH (ref 70–99)
Glucose-Capillary: 152 mg/dL — ABNORMAL HIGH (ref 70–99)
Glucose-Capillary: 174 mg/dL — ABNORMAL HIGH (ref 70–99)
Glucose-Capillary: 181 mg/dL — ABNORMAL HIGH (ref 70–99)
Glucose-Capillary: 292 mg/dL — ABNORMAL HIGH (ref 70–99)
Glucose-Capillary: 95 mg/dL (ref 70–99)

## 2019-03-11 LAB — BASIC METABOLIC PANEL
Anion gap: 18 — ABNORMAL HIGH (ref 5–15)
BUN: 101 mg/dL — ABNORMAL HIGH (ref 8–23)
CO2: 24 mmol/L (ref 22–32)
Calcium: 9 mg/dL (ref 8.9–10.3)
Chloride: 91 mmol/L — ABNORMAL LOW (ref 98–111)
Creatinine, Ser: 7.04 mg/dL — ABNORMAL HIGH (ref 0.44–1.00)
GFR calc Af Amer: 6 mL/min — ABNORMAL LOW (ref >60)
GFR calc non Af Amer: 6 mL/min — ABNORMAL LOW (ref >60)
Glucose, Bld: 124 mg/dL — ABNORMAL HIGH (ref 70–99)
Potassium: 3.9 mmol/L (ref 3.5–5.1)
Sodium: 133 mmol/L — ABNORMAL LOW (ref 135–145)

## 2019-03-11 LAB — CULTURE, BLOOD (ROUTINE X 2)
Culture: NO GROWTH
Culture: NO GROWTH
Special Requests: ADEQUATE
Special Requests: ADEQUATE

## 2019-03-11 LAB — CBC
HCT: 30.2 % — ABNORMAL LOW (ref 36.0–46.0)
Hemoglobin: 9.3 g/dL — ABNORMAL LOW (ref 12.0–15.0)
MCH: 22.5 pg — ABNORMAL LOW (ref 26.0–34.0)
MCHC: 30.8 g/dL (ref 30.0–36.0)
MCV: 72.9 fL — ABNORMAL LOW (ref 80.0–100.0)
Platelets: 356 10*3/uL (ref 150–400)
RBC: 4.14 MIL/uL (ref 3.87–5.11)
RDW: 24.5 % — ABNORMAL HIGH (ref 11.5–15.5)
WBC: 16.8 10*3/uL — ABNORMAL HIGH (ref 4.0–10.5)
nRBC: 0.3 % — ABNORMAL HIGH (ref 0.0–0.2)

## 2019-03-11 LAB — HEPARIN LEVEL (UNFRACTIONATED): Heparin Unfractionated: <0.1 IU/mL — ABNORMAL LOW (ref 0.30–0.70)

## 2019-03-11 LAB — MAGNESIUM: Magnesium: 2.8 mg/dL — ABNORMAL HIGH (ref 1.7–2.4)

## 2019-03-11 MED ORDER — AMIODARONE HCL IN DEXTROSE 360-4.14 MG/200ML-% IV SOLN
60.0000 mg/h | INTRAVENOUS | Status: AC
  Administered 2019-03-11 (×2): 60 mg/h via INTRAVENOUS
  Filled 2019-03-11: qty 200

## 2019-03-11 MED ORDER — AMIODARONE HCL IN DEXTROSE 360-4.14 MG/200ML-% IV SOLN
INTRAVENOUS | Status: AC
  Administered 2019-03-11: 20:00:00 150 mg via INTRAVENOUS
  Filled 2019-03-11: qty 200

## 2019-03-11 MED ORDER — DILTIAZEM LOAD VIA INFUSION
10.0000 mg | Freq: Once | INTRAVENOUS | Status: AC
  Administered 2019-03-11: 10 mg via INTRAVENOUS
  Filled 2019-03-11: qty 10

## 2019-03-11 MED ORDER — AMIODARONE IV BOLUS ONLY 150 MG/100ML: 150.0000 mg | Freq: Once | INTRAVENOUS | Status: DC

## 2019-03-11 MED ORDER — DIPHENHYDRAMINE-ZINC ACETATE 2-0.1 % EX CREA
TOPICAL_CREAM | Freq: Two times a day (BID) | CUTANEOUS | Status: DC | PRN
  Administered 2019-03-11 – 2019-03-13 (×2): via TOPICAL
  Filled 2019-03-11: qty 28

## 2019-03-11 MED ORDER — HEPARIN (PORCINE) 25000 UT/250ML-% IV SOLN
1050.0000 [IU]/h | INTRAVENOUS | Status: DC
  Administered 2019-03-11: 10:00:00 750 [IU]/h via INTRAVENOUS
  Administered 2019-03-12: 1050 [IU]/h via INTRAVENOUS
  Filled 2019-03-11 (×2): qty 250

## 2019-03-11 MED ORDER — AMIODARONE HCL IN DEXTROSE 360-4.14 MG/200ML-% IV SOLN
30.0000 mg/h | INTRAVENOUS | Status: DC
  Administered 2019-03-12 – 2019-03-13 (×3): 30 mg/h via INTRAVENOUS
  Filled 2019-03-11 (×3): qty 200

## 2019-03-11 MED ORDER — CHLORHEXIDINE GLUCONATE CLOTH 2 % EX PADS
6.0000 | MEDICATED_PAD | Freq: Every day | CUTANEOUS | Status: DC
  Administered 2019-03-11 – 2019-03-12 (×2): 6 via TOPICAL

## 2019-03-11 MED ORDER — AMIODARONE IV BOLUS ONLY 150 MG/100ML
150.0000 mg | Freq: Once | INTRAVENOUS | Status: AC
  Administered 2019-03-11: 150 mg via INTRAVENOUS
  Filled 2019-03-11: qty 100

## 2019-03-11 MED ORDER — AMIODARONE LOAD VIA INFUSION
150.0000 mg | Freq: Once | INTRAVENOUS | Status: AC
  Administered 2019-03-11: 150 mg via INTRAVENOUS
  Filled 2019-03-11: qty 83.34

## 2019-03-11 MED ORDER — DILTIAZEM HCL-DEXTROSE 100-5 MG/100ML-% IV SOLN (PREMIX)
5.0000 mg/h | INTRAVENOUS | Status: DC
  Administered 2019-03-11 (×2): 5 mg/h via INTRAVENOUS
  Filled 2019-03-11 (×2): qty 100

## 2019-03-11 MED ORDER — ISOSORBIDE DINITRATE 20 MG PO TABS
20.0000 mg | ORAL_TABLET | Freq: Two times a day (BID) | ORAL | Status: DC
  Administered 2019-03-12 – 2019-03-13 (×3): 20 mg
  Filled 2019-03-11 (×4): qty 1

## 2019-03-11 NOTE — Progress Notes (Signed)
[facility] Progress Note Patient Name: Rhonda Lynch DOB: [birth year] MRN: [id number]   Date of Service  03/11/2019  HPI/Events of Note  HR 72 now, hemodynamically stable  eICU Interventions  Monitor pt for tachyarrythmia        Frederik Pear 03/11/2019, 1:11 AM

## 2019-03-11 NOTE — Progress Notes (Signed)
Patients heart rate sustaining in the 170s despite titration of cardizem. MD notified. Amiodarone bolus and gtt ordered. Will continue to closely monitor.

## 2019-03-11 NOTE — Progress Notes (Signed)
[facility] ROUNDING NOTE   Subjective:   This is a very pleasant 63 year old lady that was admitted with shortness of breath pulmonary consolidation intubated by ICU was started on empiric antibiotics and IV steroids.  She was diagnosed with hypoxic respiratory failure due to bilateral pneumonia.  She is a Tuesday Thursday Saturday dialysis patient that is currently off schedule and is receiving dialysis Monday, 03/09/2019.  She has a right forearm AV graft and a hemodialysis catheter.  She has a history of cocaine abuse, tobacco abuse and emphysema.  She also has a history of hepatitis C. she has had several runs of atrial fibrillation.  She continues to be combative.  The does not appear to be much in way of movement in her left upper and lower extremity  Blood pressure 116/70 pulse 61 temperature 97.8 O2 sats 97% FiO2 30%  Sodium 133 potassium 3.9 chloride 91 CO2 24 BUN 101 creatinine 7.0 glucose 124 calcium 9.0 WBC 16.8 hemoglobin 9.3 platelets 356  Status post transfusion 2 units packed red blood cells 03/06/2019  Aspirin 81 mg daily, Nepro 1000 cc every 24 hours, Protostat 30 cc twice daily,hydralazine 25 mg 3 times daily, insulin sliding scale, Lantus 10 units nightly, isosorbide 10 mg twice daily, Solu-Medrol 40 mg daily, nicotine patch every 24 hours, Protonix 40 mg daily, Sevelamer 1.6 g 3 times daily, calcitriol 0.25 mcg Tuesday Thursday Saturday, darbepoetin 100 mcg subcu q. Monday, metoprolol 12.5 mg twice daily  IV diltiazem IV amiodarone  IV Zosyn 3.375 g every 12 hours,  Chest x-ray stable 03/10/2019 stable cardiomegaly slightly improved interstitial prominence 03/10/2019  2D echo 01/12/2019 EF 40 to 45% severely dilated left atrium and small pericardial effusion.  There is mild valvular thickening  CT scan of head 03/06/2019 no intracranial pathology  CT scan of chest 03/05/2019 small pericardial effusion new compared to previous study, dense peribronchial vascular  [name] and consolidative airspace opacities bilaterally worsened since 02/03/2019 with underlying centrilobular emphysema small bilateral pleural effusions.  Abdominal x-ray OG tube projecting over the patient's gastric body  Objective:  Vital signs in last 24 hours:  Temp:  [97.6 F (36.4 C)-98.2 F (36.8 C)] 97.8 F (36.6 C) (07/01 0700) Pulse Rate:  [38-173] 61 (07/01 0800) Resp:  [16-21] 17 (07/01 0800) BP: (96-157)/(59-98) 116/70 (07/01 0800) SpO2:  [97 %-100 %] 97 % (07/01 0800) FiO2 (%):  [30 %] 30 % (07/01 0733) Weight:  [54.1 kg] 54.1 kg (07/01 0351)  Weight change: -0.3 kg Filed Weights   03/09/19 1205 03/10/19 0500 03/11/19 0351  Weight: 52.4 kg 54.5 kg 54.1 kg    Intake/Output: I/O last 3 completed shifts: In: 1468.9 [I.V.:81.6; NG/GT:1280; IV Piggyback:107.3] Out: -    Intake/Output this shift:  No intake/output data recorded. Intubated and sedated CVS-regular at present in sinus rhythm RS-scattered rhonchorous breath sounds ventilator ABD- BS present soft non-distended OG tube EXT- no edema right AV graft with thrill bruit right [facility]   Basic Metabolic Panel: Recent Labs  Lab 03/06/19 0531  03/07/19 0326 03/08/19 0410 03/09/19 0442 03/09/19 2228 03/10/19 0441 03/11/19 0402  NA 137   < > 137 135 135  --  133* 133*  K 3.5   < > 3.7 4.7 4.5 4.0 4.3 3.9  CL 97*   < > 100 96* 93*  --  93* 91*  CO2 29   < > _0 --  25 24  GLUCOSE 101*   < > 144* 209* 217*  --  198* 124*  BUN 18   < > 25* 59* 94*  --  63* 101*  CREATININE 4.38*   < > 4.71* 6.48* 7.89*  --  5.46* 7.04*  CALCIUM 8.4*   < > 8.8* 8.9 8.6*  --  8.7* 9.0  MG  --   --   --   --   --  2.3  --   --   PHOS 4.2  --  5.6*  --   --   --   --   --    < > = values in this interval not displayed.    Liver Function Tests: Recent Labs  Lab 03/08/2019 1049 03/06/19 0531 03/07/19 0326  AST 43*  --   --   ALT 23  --   --   ALKPHOS 110  --   --   BILITOT 0.8  --   --   PROT 7.8  --   --    ALBUMIN 2.1* 2.0* 2.0*   Recent Labs  Lab 02/10/2019 1049  LIPASE 46   No results for input(s): AMMONIA in the last 168 hours.  CBC: Recent Labs  Lab 02/24/2019 1049  03/07/19 1925 03/08/19 0410 03/09/19 0442 03/10/19 0441 03/11/19 0402  WBC 12.5*   < > 10.9* 11.2* 16.9* 16.2* 16.8*  NEUTROABS 9.2*  --   --   --   --   --   --   HGB 7.7*   < > 7.5* 7.8* 8.7* 8.4* 9.3*  HCT 25.6*   < > 24.7* 25.2* 27.8* 26.8* 30.2*  MCV 72.5*   < > 74.8* 73.5* 73.2* 72.6* 72.9*  PLT 288   < > 219 242 318 297 356   < > = values in this interval not displayed.    Cardiac Enzymes: No results for input(s): CKTOTAL, CKMB, CKMBINDEX, TROPONINI in the last 168 hours.  BNP: Invalid input(s): POCBNP  CBG: Recent Labs  Lab 03/10/19 1511 03/10/19 1920 03/11/19 0051 03/11/19 0348 03/11/19 0704  GLUCAP 186* 172* 95 116* 152*    Microbiology: Results for orders placed or performed during the hospital encounter of 02/27/2019  SARS Coronavirus 2 (CEPHEID - Performed in [place] hospital lab), Hosp Order     Status: None   Collection Time: 02/24/2019 12:56 PM   Specimen: Nasopharyngeal Swab  Result Value Ref Range Status   SARS Coronavirus 2 NEGATIVE NEGATIVE Final    Comment: (NOTE) If result is NEGATIVE SARS-CoV-2 target nucleic acids are NOT DETECTED. The SARS-CoV-2 RNA is generally detectable in upper and lower  respiratory specimens during the acute phase of infection. The lowest  concentration of SARS-CoV-2 viral copies this assay can detect is 250  copies / mL. A negative result does not preclude SARS-CoV-2 infection  and should not be used as the sole basis for treatment or other  patient management decisions.  A negative result may occur with  improper specimen collection / handling, submission of specimen other  than nasopharyngeal swab, presence of viral mutation(s) within the  areas targeted by this assay, and inadequate number of viral copies  (<250 copies / mL). A negative  result must be combined with clinical  observations, patient history, and epidemiological information. If result is POSITIVE SARS-CoV-2 target nucleic acids are DETECTED. The SARS-CoV-2 RNA is generally detectable in upper and lower  respiratory specimens dur ing the acute phase of infection.  Positive  results are indicative of active infection with SARS-CoV-2.  Clinical  correlation with patient history  and other diagnostic information is  necessary to determine patient infection status.  Positive results do  not rule out bacterial infection or co-infection with other viruses. If result is PRESUMPTIVE POSTIVE SARS-CoV-2 nucleic acids MAY BE PRESENT.   A presumptive positive result was obtained on the submitted specimen  and confirmed on repeat testing.  While 2019 novel coronavirus  (SARS-CoV-2) nucleic acids may be present in the submitted sample  additional confirmatory testing may be necessary for epidemiological  and / or clinical management purposes  to differentiate between  SARS-CoV-2 and other Sarbecovirus currently known to infect humans.  If clinically indicated additional testing with an alternate test  methodology [phone]) is advised. The SARS-CoV-2 RNA is generally  detectable in upper and lower respiratory sp ecimens during the acute  phase of infection. The expected result is Negative. Fact Sheet for Patients:  StrictlyIdeas.no Fact Sheet for Healthcare Providers: BankingDealers.co.za This test is not yet approved or cleared by the [place] FDA and has been authorized for detection and/or diagnosis of SARS-CoV-2 by FDA under an Emergency Use Authorization (EUA).  This EUA will remain in effect (meaning this test can be used) for the duration of the COVID-19 declaration under Section 564(b)(1) of the Act, 21 U.S.C. section 360bbb-3(b)(1), unless the authorization is terminated or revoked sooner. Performed at [facility], [place] [address] [place], [place] [address]   Culture, blood (routine x 2)     Status: None   Collection Time: 03/05/19 10:58 PM   Specimen: BLOOD  Result Value Ref Range Status   Specimen Description BLOOD LEFT ANTECUBITAL  Final   Special Requests   Final    BOTTLES DRAWN AEROBIC ONLY Blood Culture adequate volume   Culture   Final    NO GROWTH 5 DAYS Performed at [facility], [place] [address], [place], [place] [address]    Report Status 03/11/2019 FINAL  Final  Culture, blood (Routine X 2) w Reflex to ID Panel     Status: None   Collection Time: 03/05/19 11:05 PM   Specimen: BLOOD  Result Value Ref Range Status   Specimen Description BLOOD SITE NOT SPECIFIED  Final   Special Requests   Final    BOTTLES DRAWN AEROBIC ONLY Blood Culture adequate volume   Culture   Final    NO GROWTH 5 DAYS Performed at [facility], [place] [address], [place], [place] [address]    Report Status 03/11/2019 FINAL  Final  MRSA PCR Screening     Status: None   Collection Time: 03/06/19  6:57 PM   Specimen: Nasal Mucosa; Nasopharyngeal  Result Value Ref Range Status   MRSA by PCR NEGATIVE NEGATIVE Final    Comment:        The GeneXpert MRSA Assay (FDA approved for NASAL specimens only), is one component of a comprehensive MRSA colonization surveillance program. It is not intended to diagnose MRSA infection nor to guide or monitor treatment for MRSA infections. Performed at [facility], [place] [address], [place],  [address]   Culture, respiratory (non-expectorated)     Status: None   Collection Time: 03/06/19  8:21 PM   Specimen: Tracheal Aspirate; Respiratory  Result Value Ref Range Status   Specimen Description TRACHEAL ASPIRATE  Final   Special Requests NONE  Final   Gram Stain NO WBC SEEN NO ORGANISMS SEEN   Final   Culture   Final    FEW Consistent with normal respiratory flora.  Performed at [facility], [place] [address] [place],   [address]    Report Status 03/09/2019 FINAL  Final    Coagulation Studies: No results for input(s): LABPROT, INR in the last 72 hours.  Urinalysis: No results for input(s): COLORURINE, LABSPEC, PHURINE, GLUCOSEU, HGBUR, BILIRUBINUR, KETONESUR, PROTEINUR, UROBILINOGEN, NITRITE, LEUKOCYTESUR in the last 72 hours.  Invalid input(s): APPERANCEUR    Imaging: Mr Brain Wo Contrast  Result Date: 03/11/2019 CLINICAL DATA:  63 y/o  F; left hemiplegia. EXAM: MRI HEAD WITHOUT CONTRAST TECHNIQUE: Multiplanar, multiecho pulse sequences of the brain and surrounding structures were obtained without intravenous contrast. COMPARISON:  03/06/2019 CT head.  07/18/2018 MRI head. FINDINGS: Brain: Diffuse patchy areas of reduced diffusion throughout the bilateral cerebellar hemispheres, right cerebral hemisphere and MCA and PCA distributions. There are punctate foci of reduced diffusion within the left midbrain and right posterior pons. Moderate size foci of reduced diffusion are present in the right basal ganglia concentrated and caudate nucleus and thalamus. Several punctate foci are scattered throughout the left frontal and parietal lobes and there is more confluent reduced diffusion in the left occipital lobe. Small foci are present within the left caudate head and thalamus. Findings are compatible with acute/early subacute infarction. Areas of infarction are T2 hyperintense and demonstrate mild local mass effect. No new susceptibility hypointensity to indicate intracranial hemorrhage. No extra-axial collection, hydrocephalus, or herniation. Vascular: Normal flow voids. Skull and upper cervical spine: Normal marrow signal. Sinuses/Orbits: Left maxillary sinus mucosal thickening. Partial opacification of right mastoid air cells. Debris within nasopharynx likely due to intubation. Orbits are unremarkable. Other: None. IMPRESSION: Diffuse patchy acute/early subacute infarction of the cerebellum, right cerebral hemisphere,  left occipital lobe, and right greater than left basal ganglia. Punctate foci are present in the brainstem as well as elsewhere throughout the left cerebral hemisphere. No associated hemorrhage. Mild local mass effect. Diffuse involvement of the brain favors hypoxic ischemic injury, embolic phenomenon, or possibly vasculitis. These results will be called to the ordering clinician or representative by the Radiologist Assistant, and communication documented in the PACS or zVision Dashboard. Electronically Signed   By: Kristine Garbe M.D.   On: 03/11/2019 00:34   Dg Chest Port 1 View  Result Date: 03/10/2019 CLINICAL DATA:  Respiratory failure. EXAM: PORTABLE CHEST 1 VIEW COMPARISON:  03/09/2019. FINDINGS: Endotracheal tube, NG tube, right IJ line, and dual-lumen right IJ catheter stable position. Stable cardiomegaly. Continued slight improvement interstitial prominence. No pleural effusion noted on today's exam. IMPRESSION: 1.  Lines and tubes in stable position. 2. Stable cardiomegaly. Continued slight improvement in interstitial prominence. Electronically Signed   By: Marcello Moores  Register   On: 03/10/2019 06:35   Dg Abd Portable 1v  Result Date: 03/11/2019 CLINICAL DATA:  NG tube placement EXAM: PORTABLE ABDOMEN - 1 VIEW COMPARISON:  03/06/2019 FINDINGS: Esophageal tube tip overlies the gastric body. Gas pattern is nonobstructed. Central venous catheter tips over the right atrium. IMPRESSION: 1. Esophageal tube tip overlies the gastric body. Electronically Signed   By: Donavan Foil M.D.   On: 03/11/2019 00:50     Medications:   . diltiazem (CARDIZEM) infusion 5 mg/hr (03/11/19 0600)   . sodium chloride   Intravenous Once  . aspirin  81 mg Per Tube Daily  . calcitRIOL  0.25 mcg Oral Q T,Th,Sat-1800  . chlorhexidine gluconate (MEDLINE KIT)  15 mL Mouth Rinse BID  . Chlorhexidine Gluconate Cloth  6 each Topical Q0600  . darbepoetin (ARANESP) injection - NON-DIALYSIS  100 mcg  Subcutaneous Q  Mon-1800  . feeding supplement (NEPRO CARB STEADY)  1,000 mL Per Tube Q24H  . feeding supplement (PRO-STAT SUGAR FREE 64)  30 mL Per Tube BID  . heparin  5,000 Units Subcutaneous Q8H  . hydrALAZINE  50 mg Per Tube TID  . insulin aspart  0-15 Units Subcutaneous Q4H  . insulin glargine  10 Units Subcutaneous Daily  . isosorbide dinitrate  20 mg Oral BID  . mouth rinse  15 mL Mouth Rinse 10 times per day  . methylPREDNISolone (SOLU-MEDROL) injection  40 mg Intravenous Daily  . metoprolol tartrate  12.5 mg Per Tube BID  . nicotine  14 mg Transdermal Daily  . pantoprazole sodium  40 mg Per Tube Daily  . sevelamer carbonate  1.6 g Per Tube TID WC  . sodium chloride flush  3 mL Intravenous Q12H   calcium carbonate (dosed in mg elemental calcium), camphor-menthol **AND** [DISCONTINUED] hydrOXYzine, diphenhydrAMINE-zinc acetate, docusate sodium, fentaNYL (SUBLIMAZE) injection, heparin, ipratropium-albuterol, metoprolol tartrate, ondansetron **OR** ondansetron (ZOFRAN) IV, polyethylene glycol, sorbitol  Assessment/ Plan:   ESRD-Tuesday Thursday Saturday dialysis patient she is currently off schedule.  She receives dialysis 03/09/2019 with removal of 2 L.  Next dialysis treatment will be scheduled 03/11/2019.  She has a AV graft and hemodialysis catheter  Anemia status post transfusion x2 units 03/06/2019 last iron saturations 15%.  We will continue to follow.  Started darbepoetin 100 mcg weekly  Bones.  Continues on sevelamer binders.  Phosphorus reasonable at this point.  Calcium controlled.  She also continues on calcitriol 0.25 mcg daily  Hypertension.  Continues on antihypertensive medications.  Next dialysis treatment 03/11/2019  Congestive heart failure systolic dysfunction EF 40 to 45%  Pneumonia continues on Zosyn IV  Emphysema IV Solu-Medrol.  Diabetes mellitus as per primary team   Nutrition continues on Protostat twice daily and Nepro 1 L per 24 hours  History of hepatitis  C  History of polysubstance abuse  Encephalopathy with a history of CVA in the past negative except for small vessel white matter disease CT scan of head.  Scheduled for MRI 03/11/2019  Paroxysmal atrial fibrillation initiated with amiodarone and metoprolol.   LOS: [place] _0 _1 :14 AM

## 2019-03-11 NOTE — Progress Notes (Signed)
Nutrition Follow-up  DOCUMENTATION CODES:   Not applicable  INTERVENTION:   Continue TF via OGT:   Nepro at 30 ml/h  Pro-stat 30 ml BID  Provides 1496 kcal, 88 gm protein, 523 ml free water daily  NUTRITION DIAGNOSIS:   Inadequate oral intake related to inability to eat as evidenced by NPO status.  Ongoing  GOAL:   Patient will meet greater than or equal to 90% of their needs  Met with TF  MONITOR:   TF tolerance, Labs, Weight trends, Skin, Vent status, I & O's  ASSESSMENT:   63 yo FM, ESRD on HD, DM, CHF, chronic cocaine use, tobacco use, admitted for volume overload, hypoxic respiratory failure due to BL pna, CT imaging with peri-bronchovascular infiltrates, proximal perlymphatic and perihilar distribution with intralobular septal thickening and BL GGO.  S/P MRI, posterior circulation ischemic changes seen.  Patient remains intubated on ventilator support MV: 7.4 L/min Temp (24hrs), Avg:97.9 F (36.6 C), Min:97.6 F (36.4 C), Max:98.2 F (36.8 C)   Patient pulled out NGT last night. RN placed an OGT and TF was resumed. Currently receiving Nepro at goal rate of 30 ml/h with Pro-stat 30 ml BID.    Labs reviewed. Sodium 133 (L), BUN 101 (H), creatinine 7.04 (H) CBG's: [phone]   Medications reviewed and include calcitriol, Novolog, Lantus, Solumedrol, Renvela.  I/O net negative 1.5 L 54.1 kg today Weight down 2.4 kg since admission EDW 53.4 kg, down to 52.4 kg on 6/29 after dialysis.  Per RN documentation, patient has mild pitting edema to BLE, non pitting edema to BUE, and mild pitting generalized edema.   Diet Order:   Diet Order            Diet NPO time specified  Diet effective now              EDUCATION NEEDS:   Not appropriate for education at this time  Skin:  Skin Assessment: Reviewed RN Assessment(MASD to buttocks)  Last BM:  6/29  Height:   Ht Readings from Last 1 Encounters:  02/18/19 5' 1" (1.549 m)    Weight:   Wt  Readings from Last 1 Encounters:  03/11/19 54.1 kg    Ideal Body Weight:  47.7 kg  BMI:  Body mass index is 22.54 kg/m.  Estimated Nutritional Needs:   Kcal:  1200-1500  Protein:  85-100 grams  Fluid:  1 L + UOP    Molli Barrows, RD, LDN, [place] Pager [phone] After Hours Pager [phone]

## 2019-03-11 NOTE — Progress Notes (Signed)
ANTICOAGULATION CONSULT NOTE - Initial Consult  Pharmacy Consult:  Heparin Indication:  Afib/Aflutter  Allergies  Allergen Reactions  . Acetaminophen Nausea And Vomiting    Patient states Acetaminophen and Acetaminophen containing products make her nauseated. She demonstrated this 06/21/18 with nausea followed by emesis.  Marland Kitchen Hydroxyzine Itching    Patient Measurements: Weight: 119 lb 4.3 oz (54.1 kg) Heparin Dosing Weight: 54 kg  Vital Signs: Temp: 97.8 F (36.6 C) (07/01 0700) Temp Source: Oral (07/01 0700) BP: 116/70 (07/01 0800) Pulse Rate: 61 (07/01 0800)  Labs: Recent Labs    03/09/19 0442 03/10/19 0441 03/11/19 0402  HGB 8.7* 8.4* 9.3*  HCT 27.8* 26.8* 30.2*  PLT 318 297 356  CREATININE 7.89* 5.46* 7.04*    Estimated Creatinine Clearance: 6 mL/min (A) (by C-G formula based on SCr of 7.04 mg/dL (H)).   Medical History: Past Medical History:  Diagnosis Date  . Diabetes mellitus without complication ([place])   . ESRD (end stage renal disease) on dialysis ([place])    "TTS; [place]; they're moving me to another one" (09/22/2018)  . Hepatitis C    diagnosed 09/2018  . Hypertension   . Oxygen deficiency    2 L at night  . Stroke ([place])   . Substance abuse ([place])    has been clean for 4 months      Assessment: 24 YOF presented with SOB and volume overload.  Now in and out of Aflutter and Pharmacy consulted to dose IV heparin (CHADsVASc = 6).  Patient received heparin SQ this AM.  No bleeding reported.    Patient was not waking up yesterday and MRI showed possible acute/early subacute infarction; hypoxic ischemic injury vs embolic phenomenon vs vasculitis; no hemorrhage.    Goal of Therapy:  Heparin level 0.3 - 0.5 units/mL Monitor platelets by anticoagulation protocol: Yes   Plan:  D/C heparin SQ Heparin gtt at 750 units/hr, no bolus with recent SQ administration  Check 8 hr heparin level Daily heparin level and CBC  Angely Dietz D. Mina Marble, PharmD, BCPS,  [place] 03/11/2019, 8:45 AM

## 2019-03-11 NOTE — Progress Notes (Signed)
PCCM Brief Communication Note   Attempted to reach the patient's son twice to provide updates regarding patient's status and plan of care. Directed to voicemail.   As of current, family has not been informed of MRI results.    Eliseo Gum MSN, AGACNP-BC [place] [phone] If no answer, [phone] 03/11/2019, 4:09 PM

## 2019-03-11 NOTE — Progress Notes (Signed)
Subjective: Patient was intubated, not sedated. Intermittently responsive to stimuli. No family at bedside.   Objective: Current vital signs: BP 96/73   Pulse 65   Temp 97.8 F (36.6 C) (Oral)   Resp 17   Wt 54.1 kg   SpO2 100%   BMI 22.54 kg/m  Vital signs in last 24 hours: Temp:  [97.6 F (36.4 C)-98.2 F (36.8 C)] 97.8 F (36.6 C) (07/01 0700) Pulse Rate:  [38-173] 65 (07/01 0730) Resp:  [16-22] 17 (07/01 0730) BP: (96-157)/(59-98) 96/73 (07/01 0730) SpO2:  [99 %-100 %] 100 % (07/01 0700) FiO2 (%):  [30 %] 30 % (07/01 0733) Weight:  [54.1 kg] 54.1 kg (07/01 0351)  Intake/Output from previous day: 06/30 0701 - 07/01 0700 In: 1059.6 [I.V.:81.6; NG/GT:920; IV Piggyback:58] Out: -  Intake/Output this shift: No intake/output data recorded. Nutritional status:  Diet Order            Diet NPO time specified  Diet effective now              Neurologic Exam: General: Intubated, moderate agitation  Mental Status: Decreased level of consciousness, does not respond to voice, responds to sternal rub. Moves RUE and RLE spontaneously.  Cranial Nerves: II: No blink to threat. Pupils 2-3 mm in size, sluggish reaction to light.  III,IV, VI: Eyes are disconjugate. Dolls eye reflex on right, no dolls eye on left.  V,VII: No obvious asymmetry but difficult to determine due to intubation VIII: Unable to assess, no response to voice IX,X: Intubated XI: Unable to assess, not following commands XII: Intubated Motor: Right : Upper extremity moves RUE spontaneously, with trashing movements, has semiperpuosful movement with sternal rub. Lower extremity:  Moves spontaneously, minimal response to noxious stimuli  Left:     Upper extremity   Flaccid, no response to noxious stimuli Lower extremity   Flaccid, decreased muscle mass, no response to stimuli Sensory: Response to sternal rub and noxious stimuli Deep Tendon Reflexes:  Lower extremities: Right patella reflex 4+, with (crossed  adductor on left), Left patella reflex 0. Positive Hoffmans reflex on left upper extremity.   Plantars: Right : Down going Left: Up going Cerebellar: Unable to assess Gait: Unable to assess   Lab Results: Results for orders placed or performed during the hospital encounter of 02/25/2019 (from the past 48 hour(s))  Glucose, capillary     Status: Abnormal   Collection Time: 03/09/19 11:03 AM  Result Value Ref Range   Glucose-Capillary 131 (H) 70 - 99 mg/dL  Glucose, capillary     Status: Abnormal   Collection Time: 03/09/19  3:26 PM  Result Value Ref Range   Glucose-Capillary 205 (H) 70 - 99 mg/dL  Glucose, capillary     Status: Abnormal   Collection Time: 03/09/19  7:20 PM  Result Value Ref Range   Glucose-Capillary 134 (H) 70 - 99 mg/dL  Magnesium     Status: None   Collection Time: 03/09/19 10:28 PM  Result Value Ref Range   Magnesium 2.3 1.7 - 2.4 mg/dL    Comment: Performed at [facility], [place] [address], [place], [place] [address]  Potassium     Status: None   Collection Time: 03/09/19 10:28 PM  Result Value Ref Range   Potassium 4.0 3.5 - 5.1 mmol/L    Comment: Performed at [facility] [address], [place], [place] [address]  Glucose, capillary     Status: Abnormal   Collection Time: 03/09/19 11:29 PM  Result Value Ref Range   Glucose-Capillary 218 (H) 70 - 99 mg/dL  TSH     Status: None   Collection Time: 03/10/19 12:41 AM  Result Value Ref Range   TSH 1.776 0.350 - 4.500 uIU/mL    Comment: Performed by a 3rd Generation assay with a functional sensitivity of <=0.01 uIU/mL. Performed at [facility], [place] [address] [place], [place] [address]   Glucose, capillary     Status: Abnormal   Collection Time: 03/10/19  4:24 AM  Result Value Ref Range   Glucose-Capillary 177 (H) 70 - 99 mg/dL  Procalcitonin     Status: None   Collection Time: 03/10/19  4:41 AM  Result Value Ref Range   Procalcitonin 1.99 ng/mL    Comment:         Interpretation: PCT > 0.5 ng/mL and <= 2 ng/mL: Systemic infection (sepsis) is possible, but other conditions are known to elevate PCT as well. (NOTE)       Sepsis PCT Algorithm           Lower Respiratory Tract                                      Infection PCT Algorithm    ----------------------------     ----------------------------         PCT < 0.25 ng/mL                PCT < 0.10 ng/mL         Strongly encourage             Strongly discourage   discontinuation of antibiotics    initiation of antibiotics    ----------------------------     -----------------------------       PCT 0.25 - 0.50 ng/mL            PCT 0.10 - 0.25 ng/mL               OR       >80% decrease in PCT            Discourage initiation of                                            antibiotics      Encourage discontinuation           of antibiotics    ----------------------------     -----------------------------         PCT >= 0.50 ng/mL              PCT 0.26 - 0.50 ng/mL                AND       <80% decrease in PCT             Encourage initiation of                                             antibiotics       Encourage continuation           of antibiotics    ----------------------------     -----------------------------  PCT >= 0.50 ng/mL                  PCT > 0.50 ng/mL               AND         increase in PCT                  Strongly encourage                                      initiation of antibiotics    Strongly encourage escalation           of antibiotics                                     -----------------------------                                           PCT <= 0.25 ng/mL                                                 OR                                        > 80% decrease in PCT                                     Discontinue / Do not initiate                                             antibiotics Performed at [facility], [address] [place],  [place] [address]   CBC     Status: Abnormal   Collection Time: 03/10/19  4:41 AM  Result Value Ref Range   WBC 16.2 (H) 4.0 - 10.5 K/uL   RBC 3.69 (L) 3.87 - 5.11 MIL/uL   Hemoglobin 8.4 (L) 12.0 - 15.0 g/dL    Comment: Reticulocyte Hemoglobin testing may be clinically indicated, consider ordering this additional test [id number]    HCT 26.8 (L) 36.0 - 46.0 %   MCV 72.6 (L) 80.0 - 100.0 fL   MCH 22.8 (L) 26.0 - 34.0 pg   MCHC 31.3 30.0 - 36.0 g/dL   RDW 24.4 (H) 11.5 - 15.5 %   Platelets 297 150 - 400 K/uL    Comment: REPEATED TO VERIFY   nRBC 0.3 (H) 0.0 - 0.2 %    Comment: Performed at [facility] [address] [place], [place] [address]  Basic metabolic panel     Status: Abnormal   Collection Time: 03/10/19  4:41 AM  Result Value Ref Range   Sodium 133 (L) 135 - 145 mmol/L   Potassium 4.3 3.5 - 5.1 mmol/L   Chloride 93 (  L) 98 - 111 mmol/L   CO2 25 22 - 32 mmol/L   Glucose, Bld 198 (H) 70 - 99 mg/dL   BUN 63 (H) 8 - 23 mg/dL   Creatinine, Ser 5.46 (H) 0.44 - 1.00 mg/dL   Calcium 8.7 (L) 8.9 - 10.3 mg/dL   GFR calc non Af Amer 8 (L) >60 mL/min   GFR calc Af Amer 9 (L) >60 mL/min   Anion gap 15 5 - 15    Comment: Performed at [facility] [address], [place], [place] [address]  Glucose, capillary     Status: Abnormal   Collection Time: 03/10/19  7:05 AM  Result Value Ref Range   Glucose-Capillary 160 (H) 70 - 99 mg/dL  Glucose, capillary     Status: Abnormal   Collection Time: 03/10/19 11:08 AM  Result Value Ref Range   Glucose-Capillary 154 (H) 70 - 99 mg/dL  Glucose, capillary     Status: Abnormal   Collection Time: 03/10/19  3:11 PM  Result Value Ref Range   Glucose-Capillary 186 (H) 70 - 99 mg/dL  Glucose, capillary     Status: Abnormal   Collection Time: 03/10/19  7:20 PM  Result Value Ref Range   Glucose-Capillary 172 (H) 70 - 99 mg/dL   Comment 1 Notify RN   Glucose, capillary     Status: None   Collection Time: 03/11/19 12:51 AM  Result Value  Ref Range   Glucose-Capillary 95 70 - 99 mg/dL  Glucose, capillary     Status: Abnormal   Collection Time: 03/11/19  3:48 AM  Result Value Ref Range   Glucose-Capillary 116 (H) 70 - 99 mg/dL  CBC     Status: Abnormal   Collection Time: 03/11/19  4:02 AM  Result Value Ref Range   WBC 16.8 (H) 4.0 - 10.5 K/uL   RBC 4.14 3.87 - 5.11 MIL/uL   Hemoglobin 9.3 (L) 12.0 - 15.0 g/dL   HCT 30.2 (L) 36.0 - 46.0 %   MCV 72.9 (L) 80.0 - 100.0 fL   MCH 22.5 (L) 26.0 - 34.0 pg   MCHC 30.8 30.0 - 36.0 g/dL   RDW 24.5 (H) 11.5 - 15.5 %   Platelets 356 150 - 400 K/uL    Comment: REPEATED TO VERIFY   nRBC 0.3 (H) 0.0 - 0.2 %    Comment: Performed at [facility], [place] [address], [place], [place] [address]  Basic metabolic panel     Status: Abnormal   Collection Time: 03/11/19  4:02 AM  Result Value Ref Range   Sodium 133 (L) 135 - 145 mmol/L   Potassium 3.9 3.5 - 5.1 mmol/L   Chloride 91 (L) 98 - 111 mmol/L   CO2 24 22 - 32 mmol/L   Glucose, Bld 124 (H) 70 - 99 mg/dL   BUN 101 (H) 8 - 23 mg/dL   Creatinine, Ser 7.04 (H) 0.44 - 1.00 mg/dL   Calcium 9.0 8.9 - 10.3 mg/dL   GFR calc non Af Amer 6 (L) >60 mL/min   GFR calc Af Amer 6 (L) >60 mL/min   Anion gap 18 (H) 5 - 15    Comment: Performed at [facility] [address], [place], [place] [address]  Glucose, capillary     Status: Abnormal   Collection Time: 03/11/19  7:04 AM  Result Value Ref Range   Glucose-Capillary 152 (H) 70 - 99 mg/dL    Recent Results (from the past  240 hour(s))  SARS Coronavirus 2 (CEPHEID - Performed in [facility] hospital lab), Hosp Order     Status: None   Collection Time: 02/09/2019 12:56 PM   Specimen: Nasopharyngeal Swab  Result Value Ref Range Status   SARS Coronavirus 2 NEGATIVE NEGATIVE Final    Comment: (NOTE) If result is NEGATIVE SARS-CoV-2 target nucleic acids are NOT DETECTED. The SARS-CoV-2 RNA is generally detectable in upper and lower  respiratory specimens during the acute phase of  infection. The lowest  concentration of SARS-CoV-2 viral copies this assay can detect is 250  copies / mL. A negative result does not preclude SARS-CoV-2 infection  and should not be used as the sole basis for treatment or other  patient management decisions.  A negative result may occur with  improper specimen collection / handling, submission of specimen other  than nasopharyngeal swab, presence of viral mutation(s) within the  areas targeted by this assay, and inadequate number of viral copies  (<250 copies / mL). A negative result must be combined with clinical  observations, patient history, and epidemiological information. If result is POSITIVE SARS-CoV-2 target nucleic acids are DETECTED. The SARS-CoV-2 RNA is generally detectable in upper and lower  respiratory specimens dur ing the acute phase of infection.  Positive  results are indicative of active infection with SARS-CoV-2.  Clinical  correlation with patient history and other diagnostic information is  necessary to determine patient infection status.  Positive results do  not rule out bacterial infection or co-infection with other viruses. If result is PRESUMPTIVE POSTIVE SARS-CoV-2 nucleic acids MAY BE PRESENT.   A presumptive positive result was obtained on the submitted specimen  and confirmed on repeat testing.  While 2019 novel coronavirus  (SARS-CoV-2) nucleic acids may be present in the submitted sample  additional confirmatory testing may be necessary for epidemiological  and / or clinical management purposes  to differentiate between  SARS-CoV-2 and other Sarbecovirus currently known to infect humans.  If clinically indicated additional testing with an alternate test  methodology [phone]) is advised. The SARS-CoV-2 RNA is generally  detectable in upper and lower respiratory sp ecimens during the acute  phase of infection. The expected result is Negative. Fact Sheet for Patients:   StrictlyIdeas.no Fact Sheet for Healthcare Providers: BankingDealers.co.za This test is not yet approved or cleared by the [place] FDA and has been authorized for detection and/or diagnosis of SARS-CoV-2 by FDA under an Emergency Use Authorization (EUA).  This EUA will remain in effect (meaning this test can be used) for the duration of the COVID-19 declaration under Section 564(b)(1) of the Act, 21 U.S.C. section 360bbb-3(b)(1), unless the authorization is terminated or revoked sooner. Performed at [facility], [place] [address], [place], [place] [address]   Culture, blood (routine x 2)     Status: None (Preliminary result)   Collection Time: 03/05/19 10:58 PM   Specimen: BLOOD  Result Value Ref Range Status   Specimen Description BLOOD LEFT ANTECUBITAL  Final   Special Requests   Final    BOTTLES DRAWN AEROBIC ONLY Blood Culture adequate volume   Culture   Final    NO GROWTH 4 DAYS Performed at [facility], [place] [address] [place], [place] [address]    Report Status PENDING  Incomplete  Culture, blood (Routine X 2) w Reflex to ID Panel     Status: None (Preliminary result)   Collection Time: 03/05/19 11:05 PM   Specimen: BLOOD  Result Value Ref  Range Status   Specimen Description BLOOD SITE NOT SPECIFIED  Final   Special Requests   Final    BOTTLES DRAWN AEROBIC ONLY Blood Culture adequate volume   Culture   Final    NO GROWTH 4 DAYS Performed at [facility], [address], [place], [place] [address]    Report Status PENDING  Incomplete  MRSA PCR Screening     Status: None   Collection Time: 03/06/19  6:57 PM   Specimen: Nasal Mucosa; Nasopharyngeal  Result Value Ref Range Status   MRSA by PCR NEGATIVE NEGATIVE Final    Comment:        The GeneXpert MRSA Assay (FDA approved for NASAL specimens only), is one component of a comprehensive MRSA colonization surveillance program. It is not intended to  diagnose MRSA infection nor to guide or monitor treatment for MRSA infections. Performed at [facility], [place] [address], [place], [place] [address]   Culture, respiratory (non-expectorated)     Status: None   Collection Time: 03/06/19  8:21 PM   Specimen: Tracheal Aspirate; Respiratory  Result Value Ref Range Status   Specimen Description TRACHEAL ASPIRATE  Final   Special Requests NONE  Final   Gram Stain NO WBC SEEN NO ORGANISMS SEEN   Final   Culture   Final    FEW Consistent with normal respiratory flora. Performed at [facility], [place] [address] [place], [place] [address]    Report Status 03/09/2019 FINAL  Final    Lipid Panel No results for input(s): CHOL, TRIG, HDL, CHOLHDL, VLDL, LDLCALC in the last 72 hours.  Studies/Results: Mr Brain Wo Contrast  Result Date: 03/11/2019 CLINICAL DATA:  63 y/o  F; left hemiplegia. EXAM: MRI HEAD WITHOUT CONTRAST TECHNIQUE: Multiplanar, multiecho pulse sequences of the brain and surrounding structures were obtained without intravenous contrast. COMPARISON:  03/06/2019 CT head.  07/18/2018 MRI head. FINDINGS: Brain: Diffuse patchy areas of reduced diffusion throughout the bilateral cerebellar hemispheres, right cerebral hemisphere and MCA and PCA distributions. There are punctate foci of reduced diffusion within the left midbrain and right posterior pons. Moderate size foci of reduced diffusion are present in the right basal ganglia concentrated and caudate nucleus and thalamus. Several punctate foci are scattered throughout the left frontal and parietal lobes and there is more confluent reduced diffusion in the left occipital lobe. Small foci are present within the left caudate head and thalamus. Findings are compatible with acute/early subacute infarction. Areas of infarction are T2 hyperintense and demonstrate mild local mass effect. No new susceptibility hypointensity to indicate intracranial hemorrhage. No extra-axial collection,  hydrocephalus, or herniation. Vascular: Normal flow voids. Skull and upper cervical spine: Normal marrow signal. Sinuses/Orbits: Left maxillary sinus mucosal thickening. Partial opacification of right mastoid air cells. Debris within nasopharynx likely due to intubation. Orbits are unremarkable. Other: None. IMPRESSION: Diffuse patchy acute/early subacute infarction of the cerebellum, right cerebral hemisphere, left occipital lobe, and right greater than left basal ganglia. Punctate foci are present in the brainstem as well as elsewhere throughout the left cerebral hemisphere. No associated hemorrhage. Mild local mass effect. Diffuse involvement of the brain favors hypoxic ischemic injury, embolic phenomenon, or possibly vasculitis. These results will be called to the ordering clinician or representative by the Radiologist Assistant, and communication documented in the PACS or zVision Dashboard. Electronically Signed   By: Kristine Garbe M.D.   On: 03/11/2019 00:34   Dg Chest Port 1 View  Result Date: 03/10/2019 CLINICAL DATA:  Respiratory failure. EXAM: PORTABLE CHEST 1 VIEW COMPARISON:  03/09/2019. FINDINGS: Endotracheal tube, NG tube, right IJ line, and dual-lumen right IJ catheter stable position. Stable cardiomegaly. Continued slight improvement interstitial prominence. No pleural effusion noted on today's exam. IMPRESSION: 1.  Lines and tubes in stable position. 2. Stable cardiomegaly. Continued slight improvement in interstitial prominence. Electronically Signed   By: Marcello Moores  Register   On: 03/10/2019 06:35   Dg Abd Portable 1v  Result Date: 03/11/2019 CLINICAL DATA:  NG tube placement EXAM: PORTABLE ABDOMEN - 1 VIEW COMPARISON:  03/06/2019 FINDINGS: Esophageal tube tip overlies the gastric body. Gas pattern is nonobstructed. Central venous catheter tips over the right atrium. IMPRESSION: 1. Esophageal tube tip overlies the gastric body. Electronically Signed   By: Donavan Foil M.D.   On:  03/11/2019 00:50    Medications:  Scheduled: . sodium chloride   Intravenous Once  . aspirin  81 mg Per Tube Daily  . calcitRIOL  0.25 mcg Oral Q T,Th,Sat-1800  . chlorhexidine gluconate (MEDLINE KIT)  15 mL Mouth Rinse BID  . Chlorhexidine Gluconate Cloth  6 each Topical Q0600  . darbepoetin (ARANESP) injection - NON-DIALYSIS  100 mcg Subcutaneous Q Mon-1800  . feeding supplement (NEPRO CARB STEADY)  1,000 mL Per Tube Q24H  . feeding supplement (PRO-STAT SUGAR FREE 64)  30 mL Per Tube BID  . heparin  5,000 Units Subcutaneous Q8H  . hydrALAZINE  50 mg Per Tube TID  . insulin aspart  0-15 Units Subcutaneous Q4H  . insulin glargine  10 Units Subcutaneous Daily  . isosorbide dinitrate  20 mg Oral BID  . mouth rinse  15 mL Mouth Rinse 10 times per day  . methylPREDNISolone (SOLU-MEDROL) injection  40 mg Intravenous Daily  . metoprolol tartrate  12.5 mg Per Tube BID  . nicotine  14 mg Transdermal Daily  . pantoprazole sodium  40 mg Per Tube Daily  . sevelamer carbonate  1.6 g Per Tube TID WC  . sodium chloride flush  3 mL Intravenous Q12H   Continuous: . diltiazem (CARDIZEM) infusion 5 mg/hr (03/11/19 0600)   LTM EEG report, 6/30: Clinical interpretation: This day 1 of continuous EEG monitoring with simultaneous video monitoring did not record any clinical or subclinical seizures.  EEG was abnormal due to disorganized background activity slowing suggestive of moderate encephalopathy of nonspecific etiologies including toxic metabolic pharmacological multifocal degenerative etiologies.  Occasionally triphasic waves are present however were not a prominent findings throughout the recording.  At times appeared to be more slowing across right hemisphere although asymmetry was rather mild.   Assessment:63year old femalewith hx of substance abuse, prior CVA, ESRD, DM2, and Hep C,admitted for acute hypoxic respiratory failure secondary to bilateral pneumonia. Patient has been difficult to  arouse and notably lethargic, noted to have fluttering eyelid movements and L sided flaccidity. 1. Exam findings and historyraiseconcern for a new right cerebral hemisphere ischemic lesion not visible on CT, versus intermittentsubclinical seizures associated with left-sided Todd's paresis and encephalopathy secondary to postictal state. 2. Spot EEG without seizures, but with generalized background slowing with bilateral independent triphasic waves, most consistent with an encephalopathic state. Of note, these findings do not rule out intermittent seizure activity not captured on the spot EEG. 3. CT head reveals no acute intracranial pathology. Small-vessel white matter diseaseis noted. 4. MRI showed acute/early subacute infarction of the cerebellum, left occipital lobe, and right > left basal ganglia.  Punctate foci in the brainstem and left cerebral hemisphere.  Concern for hypoxic  ischemic injury, embolic phenomenon or possible vasculitis.   Recommendations: -No clinical seizures overnight, LTM was discontinued for MRI brain. Decreased responsiveness this AM. MRI showed diffuse acute/subacute infarction concerning for hypoxic ischemic injury.  -Hold off on any anti-convulsant medications.  -Continue holding sedation and correcting metabolic abnormalities per neprho. -New onset CVA, will need complete stroke work up to determine etiology -A1c -Lipid panel -Echocardiogram -On ASA 81 mg daily -Hold starting anti-convulsant meds unlessofficial LTM report for today documents seizure activity -Neurology will continue to follow with you   LOS: 6 days   _0  signed: Dr. Kerney Elbe 03/11/2019  7:40 AM

## 2019-03-11 NOTE — Progress Notes (Signed)
eLink Physician-Brief Progress Note Patient Name: Rhonda Lynch DOB: [birth year] MRN: [id number]   Date of Service  03/11/2019  HPI/Events of Note  Atrial flutter with sustained RVR, pt is hemodynamically stable  eICU Interventions  Amiodarone 150 mg iv bolus x 1, If rhythm persists after Amiodarone bolus will bolus 10 mg of Cardizem and start a Cardizem infusion        Okoronkwo U Ogan 03/11/2019, 4:26 AM

## 2019-03-11 NOTE — Progress Notes (Signed)
[facility] Progress Note Patient Name: Rhonda Lynch DOB: 1956-08-16 MRN: [id number]   Date of Service  03/11/2019  HPI/Events of Note  Paroxysmal flutter likely related to hypervolemia  eICU Interventions  Dialysis as scheduled to normalize circulating blood volume        Okoronkwo U Ogan 03/11/2019, 3:04 AM

## 2019-03-11 NOTE — Progress Notes (Signed)
[facility] Progress Note Patient Name: Rhonda Lynch DOB: 05-Jun-1956 MRN: [id number]   Date of Service  03/11/2019  HPI/Events of Note  Pt needs restraints order  eICU Interventions  Restraint order entered        Frederik Pear 03/11/2019, 1:00 AM

## 2019-03-11 NOTE — Progress Notes (Addendum)
NAME:  Rhonda Lynch, MRN:  [id number], DOB:  [birth year], LOS: 6 ADMISSION DATE:  02/27/2019, CONSULTATION DATE:  03/06/2019 REFERRING MD:  Teryl Lucy - [facility], CHIEF COMPLAINT:  AMS, acute on chronic hypoxia  Brief History   63 yo FM, ESRD, chronic cocaine use, tobacco use, admitted for hypoxic respiratory failure due to BL pna, CT imaging with peri-bronchovascular infiltrates, proximal perlymphatic and perihilar distribution with intralobular septal thickening and BL GGO.  On review, infiltrates dating back to 11/2018 on CT scans, CT abdomen from 10/2018 shows clear lungs Had respiratory arrest 6/26  Significant Hospital Events   03/05/2019 hypoxic event 6/26 respiratory arrest on dialysis, did not lose pulse  Consults:  Nephrology Neurology  Procedures:  ETT 6/26 >> RIJ 6/26 >>  Significant Diagnostic Tests:  03/05/2019 CT scan again shows bilateral airspace disease and cardiomegaly 6/26 head CT neg  EEG 6/29 >> slow triphasic waves , no seizure Chest x-ray 6/30  improved bilateral infiltrates MRI brain 6/30> diffuse patchy acute/subacute infarct of cerebellum, R cerebral hemisphere, L occipital lobe, R>L basal ganglia. Punctate foci in brainstem and throughout L cerebral hemisphere. No hemorrhage. Local mass effect. Diffuse involvement suggests hypoxic ischemic injury vs embolic phenomenon vs vasculitis per radiology read.    Micro Data:  resp cx 6/26 > neg BC 6/26 >> neg   Antimicrobials:  6/26 zosyn >> 6/30  Interim history/subjective:  Overnight  -MRI completed, summarized below and detail report available  -Exhibited aflutter RVR, received amiodarone and is on dilt gtt. -Episodic SVT, patient received PRN metoprolol -Afebrile, persistent mild leukocytosis   Objective   Blood pressure 96/73, pulse 65, temperature 97.8 F (36.6 C), temperature source Oral, resp. rate 17, weight 54.1 kg, SpO2 100 %.    Vent Mode: CPAP;PSV FiO2 (%):  [30 %] 30 % Set Rate:  [16 bmp] 16 bmp  Vt Set:  [400 mL] 400 mL PEEP:  [5 cmH20] 5 cmH20 Pressure Support:  [15 cmH20] 15 cmH20 Plateau Pressure:  [15 cmH20-21 cmH20] 18 cmH20   Intake/Output Summary (Last 24 hours) at 03/11/2019 0750 Last data filed at 03/11/2019 0600 Gross per 24 hour  Intake 1059.62 ml  Output -  Net 1059.62 ml   Filed Weights   03/09/19 1205 03/10/19 0500 03/11/19 0351  Weight: 52.4 kg 54.5 kg 54.1 kg    Examination: General: Chronically ill appearing older adult F, intubated, off sedated NAD  HENT: NCAT. Pink mmm. Poor dentition. ETT secure. Trachea midline.  Lungs: Scattered bilateral rhonchi. No accessory muscle recruitment on PSV/CPAP, Symmetrical chest expansion Cardiovascular: IRIR. s1s2 no rgm. Capillary refill < 3 seconds BUE BLE Abdomen: Soft flat ndnt. Normoactive x4  Extremities: Symmetrical bulk and tone, No cyanosis, no clubbing. No obvious joint deformity.  Neuro: Opens eyes to painful stimuli. Spontaneously moves LUE LLE. Does not follow commands Skin: clean dry warm without rash   Resolved Hospital Problem list     Assessment & Plan:   Acute on chronic hypoxic respiratory failure requiring intubation   Bilateral infiltrates/diffuse lung disease -dates back to 11/2018, attributed to chronic lung, drug screen was positive for cocaine in 11/2018 not checked this admit Differential includes ILD less likely and pulmonary edema due to Volume overload, BOOP -s/p 5 day course of pip tazo P -Continue to wean vent as tolerated, tolerating CPAP/SBT daily. 5/10 currently and tolerating well  -Weaning solumedrol-- currently 40mg  qD.  -Pulm hygiene  -PRN duonebs -WUA/SBT qAM -AM CXR   Acute systolic/diastolic heart failure Echo 01/2019 shows EF  45%, RVSP 49 Elevated troponin P -Aspirin daily, titrate hydralazine/ nitrates as blood pressure tolerates -6/29 episode of SVT resolved with Lopressor, hold off standing beta-blockade due to some bradycardia overnight  Atrial Flutter  -possible component of fluid overload contibuting to atrial stretch  P -Dilt gtt -Amiodarone -Will anticoagulate given risk of CVA, per pharmacy  -[place]ago today (7/1)  -continue tele -iHD as below for fluid overload   Acute encephalopathy -No seizure on EEG, LTM subsequently discontinued  History CVA -CT head 6/26 neg  Diffuse patchy acute/subacute infarct of cerebellum, R cerebral hemisphere, L occipital lobe, R>L basal ganglia. Punctate foci in brainstem and throughout L cerebral hemisphere. -MRI brain 7/1: Diffuse involvement suggests hypoxic ischemic injury vs embolic phenomenon vs vasculitis per radiology read.   P -Continue to hold sedation as able, daily WBT SBT as above -RN delirium precautions -Neurology following will appreciate recs  -metabolic correction per nephrology (iHD) -check LFTs and Ammonia tomorrow (7/2) AM   ESRD P -Nephrology following appreciate recs -Plan for iHD 7/1  DM with acute hyperglycemia  -SSI -Lantus 10 units -Anticipate that insulin needs will change as we continue to wean steroids    Best practice:  Diet: EN Pain/Anxiety/Delirium protocol (if indicated): PRN fent VAP protocol (if indicated): yes DVT prophylaxis: sq Heparin at present, transitioning to anticoag per pharmacy  GI prophylaxis: protonix  Glucose control: SSI + Lantus  Mobility: bedrest  Code Status: Full  Family Communication: Pending 7/1  Disposition: ICU   Labs   CBC: Recent Labs  Lab 02/09/2019 1049  03/07/19 1925 03/08/19 0410 03/09/19 0442 03/10/19 0441 03/11/19 0402  WBC 12.5*   < > 10.9* 11.2* 16.9* 16.2* 16.8*  NEUTROABS 9.2*  --   --   --   --   --   --   HGB 7.7*   < > 7.5* 7.8* 8.7* 8.4* 9.3*  HCT 25.6*   < > 24.7* 25.2* 27.8* 26.8* 30.2*  MCV 72.5*   < > 74.8* 73.5* 73.2* 72.6* 72.9*  PLT 288   < > 219 242 318 297 356   < > = values in this interval not displayed.    Basic Metabolic Panel: Recent Labs  Lab 03/06/19 0531  03/07/19 0326  03/08/19 0410 03/09/19 0442 03/09/19 2228 03/10/19 0441 03/11/19 0402  NA 137   < > 137 135 135  --  133* 133*  K 3.5   < > 3.7 4.7 4.5 4.0 4.3 3.9  CL 97*   < > 100 96* 93*  --  93* 91*  CO2 29   < > 26 26 22   --  25 24  GLUCOSE 101*   < > 144* 209* 217*  --  198* 124*  BUN 18   < > 25* 59* 94*  --  63* 101*  CREATININE 4.38*   < > 4.71* 6.48* 7.89*  --  5.46* 7.04*  CALCIUM 8.4*   < > 8.8* 8.9 8.6*  --  8.7* 9.0  MG  --   --   --   --   --  2.3  --   --   PHOS 4.2  --  5.6*  --   --   --   --   --    < > = values in this interval not displayed.   GFR: Estimated Creatinine Clearance: 6 mL/min (A) (by C-G formula based on SCr of 7.04 mg/dL (H)). Recent Labs  Lab 03/07/19 0326  03/08/19 0410 03/08/19  4097 03/09/19 0442 03/10/19 0441 03/11/19 0402  PROCALCITON  --   --   --  2.87 2.30 1.99  --   WBC 10.9*   < > 11.2*  --  16.9* 16.2* 16.8*  LATICACIDVEN 0.9  --   --   --   --   --   --    < > = values in this interval not displayed.    Liver Function Tests: Recent Labs  Lab 02/14/2019 1049 03/06/19 0531 03/07/19 0326  AST 43*  --   --   ALT 23  --   --   ALKPHOS 110  --   --   BILITOT 0.8  --   --   PROT 7.8  --   --   ALBUMIN 2.1* 2.0* 2.0*   Recent Labs  Lab 03/06/2019 1049  LIPASE 46   No results for input(s): AMMONIA in the last 168 hours.  ABG    Component Value Date/Time   PHART 7.385 03/06/2019 2017   PCO2ART 50.6 (H) 03/06/2019 2017   PO2ART 396.0 (H) 03/06/2019 2017   HCO3 30.3 (H) 03/06/2019 2017   TCO2 32 03/06/2019 2017   ACIDBASEDEF 4.0 (H) 01/11/2019 1800   O2SAT 100.0 03/06/2019 2017     Coagulation Profile: No results for input(s): INR, PROTIME in the last 168 hours.  Cardiac Enzymes: No results for input(s): CKTOTAL, CKMB, CKMBINDEX, TROPONINI in the last 168 hours.  HbA1C: Hgb A1c MFr Bld  Date/Time Value Ref Range Status  12/29/2018 04:20 AM 4.8 4.8 - 5.6 % Final    Comment:    (NOTE)         Prediabetes: 5.7 - 6.4          Diabetes: >6.4         Glycemic control for adults with diabetes: <7.0   12/19/2018 12:31 AM 4.6 (L) 4.8 - 5.6 % Final    Comment:    (NOTE) Pre diabetes:          5.7%-6.4% Diabetes:              >6.4% Glycemic control for   <7.0% adults with diabetes     CBG: Recent Labs  Lab 03/10/19 1511 03/10/19 1920 03/11/19 0051 03/11/19 0348 03/11/19 0704  GLUCAP 186* 172* 95 116* 152*      Critical care time: 28 minutes        Eliseo Gum MSN, AGACNP-BC [place] [phone] If no answer, [phone] 03/11/2019, 8:52 AM  Attending Note:  63 year old female who developed respiratory failure after being found unresponsive in inpatient dialysis and was intubated.  No events overnight.  Of sedation.  Opens eyes to pain but not following commands with clear lungs on exam.  I reviewed CXR myself, ETT noted, no acute disease.  Discussed with PCCM-NP.  Will start heparin for paroxysmal a-fib.  Tele monitoring.  On diltiazem and amiodarone.  Begin PS trials but no extubation given mental status.  MRI noted with posterior circulation ischemic changes.  Neuro to comment.  Will likely need a family discussion after neuro opines.  PCCM will continue to follow.  The patient is critically ill with multiple organ systems failure and requires high complexity decision making for assessment and support, frequent evaluation and titration of therapies, application of advanced monitoring technologies and extensive interpretation of multiple databases.   Critical Care Time devoted to patient care services described in this note is  31  Minutes. This time reflects time  of care of this signee Dr Jennet Maduro. This critical care time does not reflect procedure time, or teaching time or supervisory time of PA/NP/Med student/Med Resident etc but could involve care discussion time.  Rush Farmer, M.D. [facility] Pulmonary/Critical Care Medicine. Pager: [phone]. After hours  pager: [phone].

## 2019-03-11 NOTE — Progress Notes (Signed)
Came to get patient ready for MRI and observed NGT was removed. Patient using right upper extremity and grabbing at ETT as well. TF held. Went to MRI with no complications. Upon returning, placed OGT and requested right wrist restraint as patient continues to grasp at OGT, ETT.  Upon return to room, patient began having short periods of SVT. Patient received scheduled metoprolol throughout 6/30 day shift as well as three doses of PRN metoprolol IV. Elink aware and will continue to monitor at this time.

## 2019-03-11 NOTE — Progress Notes (Signed)
Patient had another episode of sustained SVT. Elink camered in and aware. PRN metoprolol given. Dr. Lucile Shutters asked for an EKG next time she goes into SVT again. Will carry out if SVT occurs and continue to monitor. Patient in no obvious signs of distress.

## 2019-03-11 DEATH — deceased

## 2019-03-12 ENCOUNTER — Inpatient Hospital Stay (HOSPITAL_COMMUNITY): Payer: Medicare Other

## 2019-03-12 DIAGNOSIS — I517 Cardiomegaly: Secondary | ICD-10-CM

## 2019-03-12 DIAGNOSIS — I6389 Other cerebral infarction: Secondary | ICD-10-CM

## 2019-03-12 DIAGNOSIS — I6340 Cerebral infarction due to embolism of unspecified cerebral artery: Secondary | ICD-10-CM | POA: Insufficient documentation

## 2019-03-12 DIAGNOSIS — J9620 Acute and chronic respiratory failure, unspecified whether with hypoxia or hypercapnia: Secondary | ICD-10-CM

## 2019-03-12 DIAGNOSIS — I361 Nonrheumatic tricuspid (valve) insufficiency: Secondary | ICD-10-CM

## 2019-03-12 DIAGNOSIS — R4182 Altered mental status, unspecified: Secondary | ICD-10-CM

## 2019-03-12 LAB — PHOSPHORUS: Phosphorus: 4.3 mg/dL (ref 2.5–4.6)

## 2019-03-12 LAB — HEPATIC FUNCTION PANEL
ALT: 23 U/L (ref 0–44)
AST: 28 U/L (ref 15–41)
Albumin: 1.8 g/dL — ABNORMAL LOW (ref 3.5–5.0)
Alkaline Phosphatase: 71 U/L (ref 38–126)
Bilirubin, Direct: <0.1 mg/dL (ref 0.0–0.2)
Total Bilirubin: 0.2 mg/dL — ABNORMAL LOW (ref 0.3–1.2)
Total Protein: 6.6 g/dL (ref 6.5–8.1)

## 2019-03-12 LAB — CBC
HCT: 25.8 % — ABNORMAL LOW (ref 36.0–46.0)
Hemoglobin: 8 g/dL — ABNORMAL LOW (ref 12.0–15.0)
MCH: 22.7 pg — ABNORMAL LOW (ref 26.0–34.0)
MCHC: 31 g/dL (ref 30.0–36.0)
MCV: 73.3 fL — ABNORMAL LOW (ref 80.0–100.0)
Platelets: 314 10*3/uL (ref 150–400)
RBC: 3.52 MIL/uL — ABNORMAL LOW (ref 3.87–5.11)
RDW: 24.6 % — ABNORMAL HIGH (ref 11.5–15.5)
WBC: 11.7 10*3/uL — ABNORMAL HIGH (ref 4.0–10.5)
nRBC: 0.3 % — ABNORMAL HIGH (ref 0.0–0.2)

## 2019-03-12 LAB — GLUCOSE, CAPILLARY
Glucose-Capillary: 195 mg/dL — ABNORMAL HIGH (ref 70–99)
Glucose-Capillary: 106 mg/dL — ABNORMAL HIGH (ref 70–99)
Glucose-Capillary: 134 mg/dL — ABNORMAL HIGH (ref 70–99)
Glucose-Capillary: 272 mg/dL — ABNORMAL HIGH (ref 70–99)
Glucose-Capillary: 199 mg/dL — ABNORMAL HIGH (ref 70–99)

## 2019-03-12 LAB — BASIC METABOLIC PANEL
Anion gap: 15 (ref 5–15)
BUN: 59 mg/dL — ABNORMAL HIGH (ref 8–23)
CO2: 25 mmol/L (ref 22–32)
Calcium: 8.4 mg/dL — ABNORMAL LOW (ref 8.9–10.3)
Chloride: 97 mmol/L — ABNORMAL LOW (ref 98–111)
Creatinine, Ser: 4.71 mg/dL — ABNORMAL HIGH (ref 0.44–1.00)
GFR calc Af Amer: 11 mL/min — ABNORMAL LOW (ref >60)
GFR calc non Af Amer: 9 mL/min — ABNORMAL LOW (ref >60)
Glucose, Bld: 182 mg/dL — ABNORMAL HIGH (ref 70–99)
Potassium: 3.7 mmol/L (ref 3.5–5.1)
Sodium: 137 mmol/L (ref 135–145)

## 2019-03-12 LAB — ECHOCARDIOGRAM COMPLETE: Weight: 1957.68 oz

## 2019-03-12 LAB — MAGNESIUM: Magnesium: 2.4 mg/dL (ref 1.7–2.4)

## 2019-03-12 LAB — HEPARIN LEVEL (UNFRACTIONATED)
Heparin Unfractionated: 0.24 IU/mL — ABNORMAL LOW (ref 0.30–0.70)
Heparin Unfractionated: 0.3 IU/mL (ref 0.30–0.70)

## 2019-03-12 LAB — AMMONIA: Ammonia: 20 umol/L (ref 9–35)

## 2019-03-12 LAB — HEMOGLOBIN A1C
Hgb A1c MFr Bld: 5 % (ref 4.8–5.6)
Mean Plasma Glucose: 96.8 mg/dL

## 2019-03-12 MED ORDER — CALCITRIOL 0.25 MCG PO CAPS
0.2500 ug | ORAL_CAPSULE | ORAL | Status: DC
  Administered 2019-03-13: 0.25 ug via ORAL
  Filled 2019-03-12: qty 1

## 2019-03-12 MED ORDER — ACETAMINOPHEN 160 MG/5ML PO SOLN: 650.0000 mg | ORAL | Status: DC | PRN

## 2019-03-12 MED ORDER — SODIUM CHLORIDE 0.9 % IV SOLN
INTRAVENOUS | Status: DC | PRN
  Administered 2019-03-12 – 2019-03-15 (×3): 250 mL via INTRAVENOUS

## 2019-03-12 MED ORDER — CHLORHEXIDINE GLUCONATE CLOTH 2 % EX PADS
6.0000 | MEDICATED_PAD | Freq: Every day | CUTANEOUS | Status: DC
  Administered 2019-03-12 – 2019-03-15 (×3): 6 via TOPICAL

## 2019-03-12 NOTE — Progress Notes (Signed)
ANTICOAGULATION CONSULT NOTE  Pharmacy Consult:  Heparin Indication:  Afib/Aflutter  Allergies  Allergen Reactions  . Acetaminophen Nausea And Vomiting    Patient states Acetaminophen and Acetaminophen containing products make her nauseated. She demonstrated this 06/21/18 with nausea followed by emesis.  Marland Kitchen Hydroxyzine Itching    Patient Measurements: Weight: 122 lb 5.7 oz (55.5 kg) Heparin Dosing Weight: 54 kg  Vital Signs: Temp: 98.1 F (36.7 C) (07/02 1910) Temp Source: Oral (07/02 1910) BP: 103/63 (07/02 1910) Pulse Rate: 57 (07/02 1910)  Labs: Recent Labs    03/10/19 0441 03/11/19 0402 03/11/19 2255 03/12/19 0448 03/12/19 0849 03/12/19 2027  HGB 8.4* 9.3*  --  8.0*  --   --   HCT 26.8* 30.2*  --  25.8*  --   --   PLT 297 356  --  314  --   --   HEPARINUNFRC  --   --  <0.10*  --  0.24* 0.30  CREATININE 5.46* 7.04*  --  4.71*  --   --     Estimated Creatinine Clearance: 9 mL/min (A) (by C-G formula based on SCr of 4.71 mg/dL (H)).  Assessment: 6 YOF presented with SOB and volume overload.  Now in and out of Aflutter and Pharmacy consulted to dose IV heparin (CHADsVASc = 6).  MRI showed possible acute/early subacute infarction; hypoxic ischemic injury vs embolic phenomenon vs vasculitis; no hemorrhage.    Heparin level at 0.3  Goal of Therapy:  Heparin level 0.3 - 0.5 units/mL Monitor platelets by anticoagulation protocol: Yes   Plan:  heparin gtt 1050 units/hr Daily heparin level and CBC  Levester Fresh, PharmD, BCPS, BCCCP Clinical Pharmacist [phone]  Please check AMION for all [place] numbers  03/12/2019 9:03 PM

## 2019-03-12 NOTE — Progress Notes (Signed)
Per CT, pt needs 20g or larger PIV above AC for CTA of head/neck. Not able to use RIJ for this and HD cath does not have purple lumen.  Consulted IV team who was unable to place PIV despite multiple tries. All relayed to Dr. Erlinda Hong, Neurology who states will cancel and order MRA.

## 2019-03-12 NOTE — Progress Notes (Addendum)
NAME:  Rhonda Lynch, MRN:  [id number], DOB:  1956/05/08, LOS: 7 ADMISSION DATE:  02/25/2019, CONSULTATION DATE:  02/12/2019 REFERRING MD:  Teryl Lucy - [facility], CHIEF COMPLAINT:  AMS, acute on chronic hypoxia  Brief History   63 yo FM, ESRD, chronic cocaine use, tobacco use, admitted for hypoxic respiratory failure due to BL pna, CT imaging with peri-bronchovascular infiltrates, proximal perlymphatic and perihilar distribution with intralobular septal thickening and BL GGO.  On review, infiltrates dating back to 11/2018 on CT scans, CT abdomen from 10/2018 shows clear lungs Had respiratory arrest 6/26  Significant Hospital Events   03/05/2019 hypoxic event 6/26 respiratory arrest on dialysis, did not lose pulse 7/1 MRI reveals new evidence of CVA  Consults:  Nephrology Neurology  Procedures:  ETT 6/26 >> RIJ 6/26 >>  Significant Diagnostic Tests:  03/05/2019 CT scan again shows bilateral airspace disease and cardiomegaly 6/26 head CT neg  EEG 6/29 >> slow triphasic waves , no seizure Chest x-ray 6/30  improved bilateral infiltrates MRI brain 6/30> diffuse patchy acute/subacute infarct of cerebellum, R cerebral hemisphere, L occipital lobe, R>L basal ganglia. Punctate foci in brainstem and throughout L cerebral hemisphere. No hemorrhage. Local mass effect. Diffuse involvement suggests hypoxic ischemic injury vs embolic phenomenon vs vasculitis per radiology read.   CXR 7/2> stable cardiomegaly, interstitial coarsening. No effusion, no pneumothorax. ETT terminates at clavicular heads  Micro Data:  resp cx 6/26 > neg [facility] 6/26 >> neg   Antimicrobials:  6/26 zosyn >> 6/30  Interim history/subjective:  Converted from Aflutter to NSR  Continues to tolerate vent wean   Objective   Blood pressure 118/65, pulse (!) 44, temperature 97.7 F (36.5 C), temperature source Axillary, resp. rate 16, weight 55.5 kg, SpO2 99 %.    Vent Mode: PRVC FiO2 (%):  [30 %-35 %] 30 % Set Rate:  [16 bmp] 16 bmp  Vt Set:  [400 mL] 400 mL PEEP:  [5 cmH20] 5 cmH20 Pressure Support:  [10 cmH20-15 cmH20] 10 cmH20 Plateau Pressure:  [9 UXL24-40 cmH20] 9 cmH20   Intake/Output Summary (Last 24 hours) at 03/12/2019 0732 Last data filed at 03/12/2019 0700 Gross per 24 hour  Intake 1268.37 ml  Output 0 ml  Net 1268.37 ml   Filed Weights   03/11/19 1729 03/11/19 2039 03/12/19 0438  Weight: 55.6 kg 55.6 kg 55.5 kg    Examination: General: Chronically ill appearing older adult F, intubated, supine in bed NAD HENT: NCAT pink mmm, moderate thin clear secretions, ETT secure, trachea midline Lungs: Scattered rhonchi bilaterally, symmetrical chest expansion, no accessory muscle recruitment  Cardiovascular: RRR, s1s2 no rgm. Capillary refill < 3 seconds, no JVD Abdomen: Soft flat, ndnt. +bowel sounds x4  Extremities: Symmetrical bulk and tone with BLE muscle atrophy. No obvious joint deformity. No clubbing or cyanosis  Neuro: Opens eyes spontaneously and to stimulation, Moves RUE RLE spontaneously. Pupils 67mm and sluggish.  Skin: Clean, dry, warm, without rash   Resolved Hospital Problem list     Assessment & Plan:   Acute on chronic respiratory failure requiring intubation   Bilateral infiltrates/diffuse lung disease -dates back to 11/2018, attributed to chronic lung disease, drug screen positive for cocaine in 11/2018 not checked this admit Differential includes ILD less likely and pulmonary edema due to Volume overload, BOOP -s/p 5 day course of pip tazo P -Continue to wean vent settings as able-- mental status precluding extubation regardless of wean  -SpO2 goal 88-92% -Solumedrol 40 qD-- continue to gradually wean  -Pulm hygiene -  BDs -AM CXR  -May be a suitable for candidate for trach if mental status improves and if family would like aggressive medical interventions.   Acute systolic/diastolic heart failure Echo 01/2019 shows EF 45%, RVSP 49 Elevated troponin Atrial Flutter -appears NSR s/p  repeat amio bolus, iHD Intermittent bradycardia without hypotension  P -ASA -ECHO  -Continue tele -Wean off dilt gtt as tolerated given NSR  -Amiodarone  -Heparin per pharmacy due to CVA risk  -metoprolol, isordil, PRN hydral -If bradycardia   Acute encephalopathy  -No seizure on EEG, LTM subsequently discontinued  History CVA -CT head 6/26 neg  Diffuse patchy acute/subacute infarct of cerebellum, R cerebral hemisphere, L occipital lobe, R>L basal ganglia. Punctate foci in brainstem and throughout L cerebral hemisphere. -MRI brain 7/1: Diffuse involvement suggests hypoxic ischemic injury vs embolic phenomenon vs vasculitis per radiology read.   P -Neurology following appreciate recs -Continue to hold sedation -ECHO per neuro -RN delirium precaution bundle -Metabolic correction per iHD/nephro - LFTs, Ammonia WNL  ESRD P -Nephrology following appreciate recs -next iHD 7/3   DM -SSI, Lantus -Check A1c per neurology rec -Anticipate that insulin needs will change as we continue to wean steroids   Leukocytosis Etiology steroids vs infection WBC decreasing with steroid wean P Trend wBC, fever curve  If febrile, pan culture consider abx  Goals of Care: Multiple attempts have been made by PCCM to contact family following results of MRI, without successful connection. Need to discuss MRI results, respiratory status, goals of care   Best practice:  Diet: EN Pain/Anxiety/Delirium protocol (if indicated): PRN fent VAP protocol (if indicated): yes DVT prophylaxis: Heparin gtt pharmacy  GI prophylaxis: protonix  Glucose control: SSI + Lantus  Mobility: bedrest  Code Status: Full  Family Communication: Pending 7/2 Disposition: ICU   Labs   CBC: Recent Labs  Lab 03/08/19 0410 03/09/19 0442 03/10/19 0441 03/11/19 0402 03/12/19 0448  WBC 11.2* 16.9* 16.2* 16.8* 11.7*  HGB 7.8* 8.7* 8.4* 9.3* 8.0*  HCT 25.2* 27.8* 26.8* 30.2* 25.8*  MCV 73.5* 73.2* 72.6* 72.9* 73.3*   PLT 242 318 297 356 884    Basic Metabolic Panel: Recent Labs  Lab 03/06/19 0531  03/07/19 0326 03/08/19 0410 03/09/19 0442 03/09/19 2228 03/10/19 0441 03/11/19 0402 03/11/19 0420 03/12/19 0448  NA 137   < > 137 135 135  --  133* 133*  --  137  K 3.5   < > 3.7 4.7 4.5 4.0 4.3 3.9  --  3.7  CL 97*   < > 100 96* 93*  --  93* 91*  --  97*  CO2 29   < > 26 26 22   --  25 24  --  25  GLUCOSE 101*   < > 144* 209* 217*  --  198* 124*  --  182*  BUN 18   < > 25* 59* 94*  --  63* 101*  --  59*  CREATININE 4.38*   < > 4.71* 6.48* 7.89*  --  5.46* 7.04*  --  4.71*  CALCIUM 8.4*   < > 8.8* 8.9 8.6*  --  8.7* 9.0  --  8.4*  MG  --   --   --   --   --  2.3  --   --  2.8* 2.4  PHOS 4.2  --  5.6*  --   --   --   --   --   --  4.3   < > = values in  this interval not displayed.   GFR: Estimated Creatinine Clearance: 9 mL/min (A) (by C-G formula based on SCr of 4.71 mg/dL (H)). Recent Labs  Lab 03/07/19 0326  03/08/19 0902 03/09/19 0442 03/10/19 0441 03/11/19 0402 03/12/19 0448  PROCALCITON  --   --  2.87 2.30 1.99  --   --   WBC 10.9*   < >  --  16.9* 16.2* 16.8* 11.7*  LATICACIDVEN 0.9  --   --   --   --   --   --    < > = values in this interval not displayed.    Liver Function Tests: Recent Labs  Lab 03/06/19 0531 03/07/19 0326 03/12/19 0448  AST  --   --  28  ALT  --   --  23  ALKPHOS  --   --  71  BILITOT  --   --  0.2*  PROT  --   --  6.6  ALBUMIN 2.0* 2.0* 1.8*   No results for input(s): LIPASE, AMYLASE in the last 168 hours. Recent Labs  Lab 03/12/19 0448  AMMONIA 20    ABG    Component Value Date/Time   PHART 7.385 03/06/2019 2017   PCO2ART 50.6 (H) 03/06/2019 2017   PO2ART 396.0 (H) 03/06/2019 2017   HCO3 30.3 (H) 03/06/2019 2017   TCO2 32 03/06/2019 2017   ACIDBASEDEF 4.0 (H) 01/11/2019 1800   O2SAT 100.0 03/06/2019 2017     Coagulation Profile: No results for input(s): INR, PROTIME in the last 168 hours.  Cardiac Enzymes: No results for  input(s): CKTOTAL, CKMB, CKMBINDEX, TROPONINI in the last 168 hours.  HbA1C: Hgb A1c MFr Bld  Date/Time Value Ref Range Status  12/29/2018 04:20 AM 4.8 4.8 - 5.6 % Final    Comment:    (NOTE)         Prediabetes: 5.7 - 6.4         Diabetes: >6.4         Glycemic control for adults with diabetes: <7.0   12/19/2018 12:31 AM 4.6 (L) 4.8 - 5.6 % Final    Comment:    (NOTE) Pre diabetes:          5.7%-6.4% Diabetes:              >6.4% Glycemic control for   <7.0% adults with diabetes     CBG: Recent Labs  Lab 03/11/19 1500 03/11/19 1932 03/11/19 2332 03/12/19 0349 03/12/19 0724  GLUCAP 174* 118* 292* 195* 106*      Critical care time: 40 minutes     Eliseo Gum MSN, AGACNP-BC [place] [phone] If no answer, [phone] 03/12/2019, 7:32 AM  Attending Note:  63 year old female with extensive PMH who presents with hypoxemic respiratory failure due to bilateral PNA.  Remains unresponsive overnight, opens eyes but not commands.  I reviewed CXR myself, ETT is in place and infiltrate noted.  Discussed with PCCM-NP.  MRI on 7/1 with multiple acute CVAs.  Continue PS weaning but no extubation given mental status.  Will speak with family today regarding plan of care prior to involvement of palliative care since patient is not waking up despite being off all sedation.  I do not believe patient is an appropriate trach candidate given mental status.  Neurology to prognosticate.  The patient is critically ill with multiple organ systems failure and requires high complexity decision making for assessment and support, frequent evaluation and titration of therapies, application of advanced monitoring  technologies and extensive interpretation of multiple databases.   Critical Care Time devoted to patient care services described in this note is  32  Minutes. This time reflects time of care of this signee Dr Jennet Maduro. This critical care time does not  reflect procedure time, or teaching time or supervisory time of PA/NP/Med student/Med Resident etc but could involve care discussion time.  Rush Farmer, M.D. [facility] Pulmonary/Critical Care Medicine. Pager: [phone]. After hours pager: [phone].

## 2019-03-12 NOTE — Progress Notes (Signed)
Advanced endotracheal tube 2MC per order from Cristal Generous, NP.

## 2019-03-12 NOTE — Progress Notes (Signed)
ANTICOAGULATION CONSULT NOTE   Pharmacy Consult:  Heparin Indication:  Afib/Aflutter  Allergies  Allergen Reactions  . Acetaminophen Nausea And Vomiting    Patient states Acetaminophen and Acetaminophen containing products make her nauseated. She demonstrated this 06/21/18 with nausea followed by emesis.  Marland Kitchen Hydroxyzine Itching    Patient Measurements: Weight: 122 lb 9.2 oz (55.6 kg) Heparin Dosing Weight: 54 kg  Vital Signs: Temp: 98.4 F (36.9 C) (07/01 2039) Temp Source: Axillary (07/01 2039) BP: 134/76 (07/01 2306) Pulse Rate: 67 (07/01 2300)  Labs: Recent Labs    03/09/19 0442 03/10/19 0441 03/11/19 0402 03/11/19 2255  HGB 8.7* 8.4* 9.3*  --   HCT 27.8* 26.8* 30.2*  --   PLT 318 297 356  --   HEPARINUNFRC  --   --   --  <0.10*  CREATININE 7.89* 5.46* 7.04*  --     Estimated Creatinine Clearance: 6 mL/min (A) (by C-G formula based on SCr of 7.04 mg/dL (H)).   Medical History: Past Medical History:  Diagnosis Date  . Diabetes mellitus without complication ([place])   . ESRD (end stage renal disease) on dialysis ([place])    "TTS; [facility]; they're moving me to another one" (09/22/2018)  . Hepatitis C    diagnosed 09/2018  . Hypertension   . Oxygen deficiency    2 L at night  . Stroke ([place])   . Substance abuse ([place])    has been clean for 4 months      Assessment: 85 YOF presented with SOB and volume overload.  Now in and out of Aflutter and Pharmacy consulted to dose IV heparin (CHADsVASc = 6).  Patient received heparin SQ this AM.  No bleeding reported.    Patient was not waking up yesterday and MRI showed possible acute/early subacute infarction; hypoxic ischemic injury vs embolic phenomenon vs vasculitis; no hemorrhage.    7/2 AM update: heparin level undetectable, no issues per RN  Goal of Therapy:  Heparin level 0.3 - 0.5 units/mL Monitor platelets by anticoagulation protocol: Yes   Plan:  Inc heparin to 950 units/hr Check 8 hr heparin  level Daily heparin level and CBC  Narda Bonds, PharmD, BCPS Clinical Pharmacist Phone: [phone]

## 2019-03-12 NOTE — Progress Notes (Signed)
[facility] ROUNDING NOTE   Subjective:   This is a very pleasant 63 year old lady that was admitted with shortness of breath pulmonary consolidation intubated by ICU was started on empiric antibiotics and IV steroids.  She was diagnosed with hypoxic respiratory failure due to bilateral pneumonia.  She is a Tuesday Thursday Saturday dialysis patient that is currently off schedule and is receiving dialysis Monday, 03/09/2019.  She has a right forearm AV graft and a hemodialysis catheter.  She has a history of cocaine abuse, tobacco abuse and emphysema.  She also has a history of hepatitis C. she has had several runs of atrial fibrillation.  She continues to be combative.  The does not appear to be much in way of movement in her left upper and lower extremity.  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  There is no associated hemorrhage and mild local mass-effect.  Diffuse involvement of the brain favors hypoxic ischemic injury embolic phenomena no possible vasculitis.  EEG revealed no seizure activity  Blood pressure 111/57 pulse 44 temperature 97.7 O2 sats 99% FiO2 30  Sodium 137 potassium 3.7 chloride 97 CO2 25 BUN 59 creatinine 4.71 glucose 182 calcium 8.4 phosphorus 4.3 magnesium 2.4 albumin 1.8 hemoglobin 8.0 WBC 11.7 platelets 314  Status post transfusion 2 units packed red blood cells 03/06/2019  Aspirin 81 mg daily, Nepro 1000 cc every 24 hours, Protostat 30 cc twice daily,hydralazine 50 mg 3 times daily, insulin sliding scale, Lantus 10 units nightly, isosorbide 20 mg twice daily, Solu-Medrol 40 mg daily, nicotine patch every 24 hours, Protonix 40 mg daily, Sevelamer 1.6 g 3 times daily, calcitriol 0.25 mcg Tuesday Thursday Saturday, darbepoetin 100 mcg subcu q.  Monday, metoprolol 12.5 mg twice daily    IV amiodarone   Chest x-ray stable 03/10/2019 stable cardiomegaly slightly improved interstitial prominence 03/10/2019  2D echo 01/12/2019 EF 40 to 45% severely dilated left atrium and small pericardial effusion.  There is mild valvular thickening  CT scan of head 03/06/2019 no intracranial pathology  CT scan of chest 03/05/2019 small pericardial effusion new compared to previous study, dense peribronchial vascular [name] and consolidative airspace opacities bilaterally worsened since 02/03/2019 with underlying centrilobular emphysema small bilateral pleural effusions.  Abdominal x-ray OG tube projecting over the patient's gastric body  Objective:  Vital signs in last 24 hours:  Temp:  [97.7 F (36.5 C)-98.6 F (37 C)] 97.7 F (36.5 C) (07/02 0723) Pulse Rate:  [44-87] 44 (07/02 0723) Resp:  [11-22] 16 (07/02 0723) BP: (95-138)/(56-79) 118/65 (07/02 0700) SpO2:  [92 %-100 %] 99 % (07/02 0723) FiO2 (%):  [30 %-35 %] 30 % (07/02 0723) Weight:  [55.5 kg-55.6 kg] 55.5 kg (07/02 0438)  Weight change: 1.5 kg Filed Weights   03/11/19 1729 03/11/19 2039 03/12/19 0438  Weight: 55.6 kg 55.6 kg 55.5 kg    Intake/Output: I/O last 3 completed shifts: In: 1663 [I.V.:725; NG/GT:930; IV Piggyback:8] Out: 0    Intake/Output this shift:  No intake/output data recorded. Intubated and sedated CVS-regular at present in sinus rhythm RS-scattered rhonchorous breath sounds ventilator ABD- BS present soft non-distended OG tube EXT- no edema right AV graft with thrill bruit right [facility]   Basic Metabolic Panel: Recent Labs  Lab 03/06/19 0531  03/07/19 0326 03/08/19 0410 03/09/19 0442 03/09/19 2228 03/10/19  5916 03/11/19 0402 03/11/19 0420 03/12/19 0448  NA 137   < > 137 135 135  --  133* 133*  --  137  K 3.5   < > 3.7 4.7 4.5 4.0 4.3 3.9  --  3.7  CL 97*   < > 100 96* 93*  --  93* 91*  --  97*  CO2 29   < > '26 26 22  ' --  25 24  --  25   GLUCOSE 101*   < > 144* 209* 217*  --  198* 124*  --  182*  BUN 18   < > 25* 59* 94*  --  63* 101*  --  59*  CREATININE 4.38*   < > 4.71* 6.48* 7.89*  --  5.46* 7.04*  --  4.71*  CALCIUM 8.4*   < > 8.8* 8.9 8.6*  --  8.7* 9.0  --  8.4*  MG  --   --   --   --   --  2.3  --   --  2.8* 2.4  PHOS 4.2  --  5.6*  --   --   --   --   --   --  4.3   < > = values in this interval not displayed.    Liver Function Tests: Recent Labs  Lab 03/06/19 0531 03/07/19 0326 03/12/19 0448  AST  --   --  28  ALT  --   --  23  ALKPHOS  --   --  71  BILITOT  --   --  0.2*  PROT  --   --  6.6  ALBUMIN 2.0* 2.0* 1.8*   No results for input(s): LIPASE, AMYLASE in the last 168 hours. Recent Labs  Lab 03/12/19 0448  AMMONIA 20    CBC: Recent Labs  Lab 03/08/19 0410 03/09/19 0442 03/10/19 0441 03/11/19 0402 03/12/19 0448  WBC 11.2* 16.9* 16.2* 16.8* 11.7*  HGB 7.8* 8.7* 8.4* 9.3* 8.0*  HCT 25.2* 27.8* 26.8* 30.2* 25.8*  MCV 73.5* 73.2* 72.6* 72.9* 73.3*  PLT 242 318 297 356 314    Cardiac Enzymes: No results for input(s): CKTOTAL, CKMB, CKMBINDEX, TROPONINI in the last 168 hours.  BNP: Invalid input(s): POCBNP  CBG: Recent Labs  Lab 03/11/19 1500 03/11/19 1932 03/11/19 2332 03/12/19 0349 03/12/19 0724  GLUCAP 174* 118* 292* 195* 106*    Microbiology: Results for orders placed or performed during the hospital encounter of 02/09/2019  SARS Coronavirus 2 (CEPHEID - Performed in [facility] hospital lab), Hosp Order     Status: None   Collection Time: 03/10/2019 12:56 PM   Specimen: Nasopharyngeal Swab  Result Value Ref Range Status   SARS Coronavirus 2 NEGATIVE NEGATIVE Final    Comment: (NOTE) If result is NEGATIVE SARS-CoV-2 target nucleic acids are NOT DETECTED. The SARS-CoV-2 RNA is generally detectable in upper and lower  respiratory specimens during the acute phase of infection. The lowest  concentration of SARS-CoV-2 viral copies this assay can detect is 250  copies /  mL. A negative result does not preclude SARS-CoV-2 infection  and should not be used as the sole basis for treatment or other  patient management decisions.  A negative result may occur with  improper specimen collection / handling, submission of specimen other  than nasopharyngeal swab, presence of viral mutation(s) within the  areas targeted by this assay, and inadequate number of viral copies  (<250 copies / mL). A negative result must be combined  with clinical  observations, patient history, and epidemiological information. If result is POSITIVE SARS-CoV-2 target nucleic acids are DETECTED. The SARS-CoV-2 RNA is generally detectable in upper and lower  respiratory specimens dur ing the acute phase of infection.  Positive  results are indicative of active infection with SARS-CoV-2.  Clinical  correlation with patient history and other diagnostic information is  necessary to determine patient infection status.  Positive results do  not rule out bacterial infection or co-infection with other viruses. If result is PRESUMPTIVE POSTIVE SARS-CoV-2 nucleic acids MAY BE PRESENT.   A presumptive positive result was obtained on the submitted specimen  and confirmed on repeat testing.  While 2019 novel coronavirus  (SARS-CoV-2) nucleic acids may be present in the submitted sample  additional confirmatory testing may be necessary for epidemiological  and / or clinical management purposes  to differentiate between  SARS-CoV-2 and other Sarbecovirus currently known to infect humans.  If clinically indicated additional testing with an alternate test  methodology [phone]) is advised. The SARS-CoV-2 RNA is generally  detectable in upper and lower respiratory sp ecimens during the acute  phase of infection. The expected result is Negative. Fact Sheet for Patients:  StrictlyIdeas.no Fact Sheet for Healthcare Providers: BankingDealers.co.za This test is  not yet approved or cleared by the [place] FDA and has been authorized for detection and/or diagnosis of SARS-CoV-2 by FDA under an Emergency Use Authorization (EUA).  This EUA will remain in effect (meaning this test can be used) for the duration of the COVID-19 declaration under Section 564(b)(1) of the Act, 21 U.S.C. section 360bbb-3(b)(1), unless the authorization is terminated or revoked sooner. Performed at [facility], [place] [address], [place], [place] [address]   Culture, blood (routine x 2)     Status: None   Collection Time: 03/05/19 10:58 PM   Specimen: BLOOD  Result Value Ref Range Status   Specimen Description BLOOD LEFT ANTECUBITAL  Final   Special Requests   Final    BOTTLES DRAWN AEROBIC ONLY Blood Culture adequate volume   Culture   Final    NO GROWTH 5 DAYS Performed at [facility], [place] [address], [place], [place] [address]    Report Status 03/11/2019 FINAL  Final  Culture, blood (Routine X 2) w Reflex to ID Panel     Status: None   Collection Time: 03/05/19 11:05 PM   Specimen: BLOOD  Result Value Ref Range Status   Specimen Description BLOOD SITE NOT SPECIFIED  Final   Special Requests   Final    BOTTLES DRAWN AEROBIC ONLY Blood Culture adequate volume   Culture   Final    NO GROWTH 5 DAYS Performed at [facility], [place] [address], [place], [place] [address]    Report Status 03/11/2019 FINAL  Final  MRSA PCR Screening     Status: None   Collection Time: 03/06/19  6:57 PM   Specimen: Nasal Mucosa; Nasopharyngeal  Result Value Ref Range Status   MRSA by PCR NEGATIVE NEGATIVE Final    Comment:        The GeneXpert MRSA Assay (FDA approved for NASAL specimens only), is one component of a comprehensive MRSA colonization surveillance program. It is not intended to diagnose MRSA infection nor to guide or monitor treatment for MRSA infections. Performed at [facility], [place] [address] [place], [place] [address]   Culture,  respiratory (non-expectorated)     Status: None   Collection Time: 03/06/19  8:21 PM   Specimen: Tracheal Aspirate; Respiratory  Result Value Ref Range Status   Specimen Description TRACHEAL ASPIRATE  Final   Special Requests NONE  Final   Gram Stain NO WBC SEEN NO ORGANISMS SEEN   Final   Culture   Final    FEW Consistent with normal respiratory flora. Performed at [facility], [place] [address], [place], [place] [address]    Report Status 03/09/2019 FINAL  Final    Coagulation Studies: No results for input(s): LABPROT, INR in the last 72 hours.  Urinalysis: No results for input(s): COLORURINE, LABSPEC, PHURINE, GLUCOSEU, HGBUR, BILIRUBINUR, KETONESUR, PROTEINUR, UROBILINOGEN, NITRITE, LEUKOCYTESUR in the last 72 hours.  Invalid input(s): APPERANCEUR    Imaging: Mr Brain Wo Contrast  Result Date: 03/11/2019 CLINICAL DATA:  63 y/o  F; left hemiplegia. EXAM: MRI HEAD WITHOUT CONTRAST TECHNIQUE: Multiplanar, multiecho pulse sequences of the brain and surrounding structures were obtained without intravenous contrast. COMPARISON:  03/06/2019 CT head.  07/18/2018 MRI head. FINDINGS: Brain: Diffuse patchy areas of reduced diffusion throughout the bilateral cerebellar hemispheres, right cerebral hemisphere and MCA and PCA distributions. There are punctate foci of reduced diffusion within the left midbrain and right posterior pons. Moderate size foci of reduced diffusion are present in the right basal ganglia concentrated and caudate nucleus and thalamus. Several punctate foci are scattered throughout the left frontal and parietal lobes and there is more confluent reduced diffusion in the left occipital lobe. Small foci are present within the left caudate head and thalamus. Findings are compatible with acute/early subacute infarction. Areas of infarction are T2 hyperintense and demonstrate mild local mass effect. No new susceptibility hypointensity to indicate intracranial hemorrhage. No  extra-axial collection, hydrocephalus, or herniation. Vascular: Normal flow voids. Skull and upper cervical spine: Normal marrow signal. Sinuses/Orbits: Left maxillary sinus mucosal thickening. Partial opacification of right mastoid air cells. Debris within nasopharynx likely due to intubation. Orbits are unremarkable. Other: None. IMPRESSION: Diffuse patchy acute/early subacute infarction of the cerebellum, right cerebral hemisphere, left occipital lobe, and right greater than left basal ganglia. Punctate foci are present in the brainstem as well as elsewhere throughout the left cerebral hemisphere. No associated hemorrhage. Mild local mass effect. Diffuse involvement of the brain favors hypoxic ischemic injury, embolic phenomenon, or possibly vasculitis. These results will be called to the ordering clinician or representative by the Radiologist Assistant, and communication documented in the PACS or zVision Dashboard. Electronically Signed   By: Kristine Garbe M.D.   On: 03/11/2019 00:34   Dg Chest Port 1 View  Result Date: 03/12/2019 CLINICAL DATA:  Acute on chronic respiratory failure EXAM: PORTABLE CHEST 1 VIEW COMPARISON:  Two days ago FINDINGS: Endotracheal tube tip at the clavicular heads. Right IJ line and dialysis catheter with tips at the right atrium. The orogastric tube tip and side-port reaches the stomach. Stable cardiomegaly. Stable interstitial coarsening with mild streaky density at the right base. No evidence of effusion or pneumothorax. IMPRESSION: 1. Higher endotracheal tube with tip at T2. 2. No significant change in aeration.  Stable cardiomegaly. Electronically Signed   By: Monte Fantasia M.D.   On: 03/12/2019 07:18   Dg Abd Portable 1v  Result Date: 03/11/2019 CLINICAL DATA:  NG tube placement EXAM: PORTABLE ABDOMEN - 1 VIEW COMPARISON:  03/06/2019 FINDINGS: Esophageal tube tip overlies the gastric body. Gas pattern is nonobstructed. Central venous catheter tips over the  right atrium. IMPRESSION: 1. Esophageal tube tip overlies the gastric body. Electronically Signed   By: Maudie Mercury  Francoise Ceo M.D.   On: 03/11/2019 00:50     Medications:   . amiodarone 30 mg/hr (03/12/19 0700)  . diltiazem (CARDIZEM) infusion 2.5 mg/hr (03/12/19 0700)  . heparin 950 Units/hr (03/12/19 0700)   . sodium chloride   Intravenous Once  . aspirin  81 mg Per Tube Daily  . calcitRIOL  0.25 mcg Oral Q T,Th,Sat-1800  . chlorhexidine gluconate (MEDLINE KIT)  15 mL Mouth Rinse BID  . Chlorhexidine Gluconate Cloth  6 each Topical Q0600  . darbepoetin (ARANESP) injection - NON-DIALYSIS  100 mcg Subcutaneous Q Mon-1800  . feeding supplement (NEPRO CARB STEADY)  1,000 mL Per Tube Q24H  . feeding supplement (PRO-STAT SUGAR FREE 64)  30 mL Per Tube BID  . hydrALAZINE  50 mg Per Tube TID  . insulin aspart  0-15 Units Subcutaneous Q4H  . insulin glargine  10 Units Subcutaneous Daily  . isosorbide dinitrate  20 mg Per Tube BID  . mouth rinse  15 mL Mouth Rinse 10 times per day  . methylPREDNISolone (SOLU-MEDROL) injection  40 mg Intravenous Daily  . metoprolol tartrate  12.5 mg Per Tube BID  . nicotine  14 mg Transdermal Daily  . pantoprazole sodium  40 mg Per Tube Daily  . sevelamer carbonate  1.6 g Per Tube TID WC  . sodium chloride flush  3 mL Intravenous Q12H   acetaminophen (TYLENOL) oral liquid 160 mg/5 mL, calcium carbonate (dosed in mg elemental calcium), camphor-menthol **AND** [DISCONTINUED] hydrOXYzine, diphenhydrAMINE-zinc acetate, docusate sodium, fentaNYL (SUBLIMAZE) injection, heparin, ipratropium-albuterol, metoprolol tartrate, ondansetron **OR** ondansetron (ZOFRAN) IV, polyethylene glycol, sorbitol  Assessment/ Plan:   ESRD-Tuesday Thursday Saturday dialysis patient she is currently off schedule.  She receives dialysis 03/11/2019 with no volume removed.  Next dialysis treatment will be scheduled 03/13/2019 she has a AV graft and hemodialysis catheter  Anemia status post  transfusion x2 units 03/06/2019 last iron saturations 15%.  We will continue to follow.  Started darbepoetin 100 mcg weekly  Bones.  Continues on sevelamer binders.  Phosphorus reasonable at this point.  Calcium controlled.  She also continues on calcitriol 0.25 mcg daily  Hypertension.  Continues on antihypertensive medications.  Next dialysis treatment 03/11/2019  Congestive heart failure systolic dysfunction EF 40 to 45%  Pneumonia-IV Zosyn has been discontinued 03/11/2019  Emphysema IV Solu-Medrol.  Diabetes mellitus as per primary team   Nutrition continues on Protostat twice daily and Nepro 1 L per 24 hours  History of hepatitis C  History of polysubstance abuse  Encephalopathy with a history of CVA in the past negative except for small vessel white matter disease CT scan of head.  MRI consistent with acute stroke  Paroxysmal atrial fibrillation initiated with amiodarone and metoprolol.  Still having runs of rapid ventricular rate   LOS: [place] '@TODAY' '@8' :00 AM

## 2019-03-12 NOTE — Progress Notes (Signed)
PCCM Communication Note  I spoke with the patient's son. I provided updates regarding the patient's clinical status and plan of care. We discussed MRI results, neuro status, cardiac dysrhythmias, renal dysfunction and respiratory status.  [name] son states "I am just in shock at this news."  I advised the patient's son that I will consult palliative care to assist in determining goals of care for the patient and the son is agreeable and thankful for this assistance.   Eliseo Gum MSN, AGACNP-BC [place] [phone] If no answer, [phone] 03/12/2019, 12:46 PM

## 2019-03-12 NOTE — Consult Note (Addendum)
Consultation Note Date: 03/12/2019   Patient Name: Rhonda Lynch  DOB: Aug 30, 1956  MRN: [id number]  Age / Sex: 63 y.o., female  PCP: Tawny Asal Referring Physician: Rush Farmer, MD  Reason for Consultation: Establishing goals of care and Psychosocial/spiritual support  HPI/Patient Profile: 63 y.o. female  with past medical history of ESRD on HD, ANCA vasculitis, previous stroke, Hep C, cocaine abuse on nocturnal oxygen at home who was admitted on 02/27/2019 with bilateral pneumonia and volume overload causing acute respiratory failure.  On 6/25 she refused to wear her oxygen and was given ativan for anxiety.  She became hypoxic and a rapid response was called.  On 6/26 in hemodialysis she suffered respiratory arrest.  She was successfully resuscitated and has remained intubated since.  Most recent imaging shows multiple diffuse strokes on in both hemispheres and the brain stem.  She is unable to be extubated due to her mental status.   Of note, Rhonda Lynch has had 11 inpatient admissions in 6 months.   PMT was consulted for [place].   Clinical Assessment and Goals of Care:  I have reviewed medical records including EPIC notes, labs and imaging, received report from the ICU RN and CCM Team, I then attempted to call her only son Merrily Pew  to discuss diagnosis prognosis, GOC, EOL wishes, disposition and options.  I left Josh a detailed voice mail and requested a call back.  I had the pleasure of speaking with Rhonda Lynch in January 2020 when she was still walking the halls and eating independently.  She talked with me about how she hated hemodialysis.  It exhausted her, caused her to be nauseated, and the procedure itself was painful to her.  She told me she planned to tell her son Merrily Pew that she no longer wanted to have hemodialysis, but she was very worried he would not take that well.  Rhonda Lynch explained that Josh's  brother was estranged and her parents and siblings had passed.  Rhonda Lynch was very vibrant and struck me as an independent woman.  She wanted to live on her own and be self sufficient.  She told me that while she loved Merrily Pew - she would never live with him again.  She has a close relationship with God and draws strength from that relationship.  PMT will continue to reach out to [facility].  I have also given my number to the ICU RN for her to call me if he gives her a time that we can talk.  SUMMARY OF RECOMMENDATIONS     PMT will follow with you.  Will support and educate Josh in order for him to make decisions regarding trach/peg/continued HD/SNF vs comfort care  Code Status/Advance Care Planning:  full   Symptom Management:   Per primary   Prognosis:  Difficult to determine with full support.  She will likely last hours to days with comfort measure only  Discharge Planning: To Be Determined      Primary Diagnoses: Present on Admission: . Volume overload . Acute  pulmonary edema ([place]) . 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[place]) . TOBACCO ABUSE . Endotracheally intubated . Fluid overload . Cocaine abuse ([place])   I have reviewed the medical record, interviewed the patient and family, and examined the patient. The following aspects are pertinent.  Past Medical History:  Diagnosis Date  . Diabetes mellitus without complication ([place])   . ESRD (end stage renal disease) on dialysis ([place])    "TTS; [facility]; they're moving me to another one" (09/22/2018)  . Hepatitis C    diagnosed 09/2018  . Hypertension   . Oxygen deficiency    2 L at night  . Stroke ([place])   . Substance abuse ([place])    has been clean for 4 months    Social History   Socioeconomic History  . Marital status: Single    Spouse name: Not on file  . Number of children: Not on file  . Years of education: Not on file  . Highest education level: Not on file  Occupational History  . Not  on file  Social Needs  . Financial resource strain: Not on file  . Food insecurity    Worry: Not on file    Inability: Not on file  . Transportation needs    Medical: Not on file    Non-medical: Not on file  Tobacco Use  . Smoking status: Current Every Day Smoker    Packs/day: 0.50    Years: 48.00    Pack years: 24.00    Types: Cigarettes  . Smokeless tobacco: Never Used  . Tobacco comment: trying to quit ; smoking 1 1/2 cigarettes/day  (09/22/2018)  Substance and Sexual Activity  . Alcohol use: Not Currently    Comment: 09/22/2018 "stopped about 6 months ago"  . Drug use: Yes    Types: Cocaine, Heroin    Comment: See notes  . Sexual activity: Not Currently    Birth control/protection: None  Lifestyle  . Physical activity    Days per week: Not on file    Minutes per session: Not on file  . Stress: Not on file  Relationships  . Social Herbalist on phone: Not on file    Gets together: Not on file    Attends religious service: Not on file    Active member of club or organization: Not on file    Attends meetings of clubs or organizations: Not on file    Relationship status: Not on file  Other Topics Concern  . Not on file  Social History Narrative   Per notes, Pt lives with her son.   Family History  Problem Relation Age of Onset  . Autoimmune disease Neg Hx    Scheduled Meds: . sodium chloride   Intravenous Once  . aspirin  81 mg Per Tube Daily  . calcitRIOL  0.25 mcg Oral Q T,Th,Sat-1800  . chlorhexidine gluconate (MEDLINE KIT)  15 mL Mouth Rinse BID  . Chlorhexidine Gluconate Cloth  6 each Topical Q0600  . Chlorhexidine Gluconate Cloth  6 each Topical Q0600  . darbepoetin (ARANESP) injection - NON-DIALYSIS  100 mcg Subcutaneous Q Mon-1800  . feeding supplement (NEPRO CARB STEADY)  1,000 mL Per Tube Q24H  . feeding supplement (PRO-STAT SUGAR FREE 64)  30 mL Per Tube BID  . hydrALAZINE  50 mg Per Tube TID  . insulin aspart  0-15 Units Subcutaneous Q4H   . insulin glargine  10 Units Subcutaneous Daily  . isosorbide dinitrate  20 mg  Per Tube BID  . mouth rinse  15 mL Mouth Rinse 10 times per day  . methylPREDNISolone (SOLU-MEDROL) injection  40 mg Intravenous Daily  . metoprolol tartrate  12.5 mg Per Tube BID  . nicotine  14 mg Transdermal Daily  . pantoprazole sodium  40 mg Per Tube Daily  . sevelamer carbonate  1.6 g Per Tube TID WC  . sodium chloride flush  3 mL Intravenous Q12H   Continuous Infusions: . amiodarone 30 mg/hr (03/12/19 0847)  . heparin 1,050 Units/hr (03/12/19 1440)   PRN Meds:.acetaminophen (TYLENOL) oral liquid 160 mg/5 mL, calcium carbonate (dosed in mg elemental calcium), camphor-menthol **AND** [DISCONTINUED] hydrOXYzine, diphenhydrAMINE-zinc acetate, docusate sodium, fentaNYL (SUBLIMAZE) injection, heparin, ipratropium-albuterol, metoprolol tartrate, ondansetron **OR** ondansetron (ZOFRAN) IV, polyethylene glycol, sorbitol Allergies  Allergen Reactions  . Acetaminophen Nausea And Vomiting    Patient states Acetaminophen and Acetaminophen containing products make her nauseated. She demonstrated this 06/21/18 with nausea followed by emesis.  Marland Kitchen Hydroxyzine Itching     Vital Signs: BP (!) 131/59   Pulse 80   Temp 98.1 F (36.7 C) (Axillary)   Resp (!) 22   Wt 55.5 kg   SpO2 98%   BMI 23.12 kg/m  Pain Scale: CPOT POSS *See Group Information*: S-Acceptable,Sleep, easy to arouse Pain Score: Asleep   SpO2: SpO2: 98 % O2 Device:SpO2: 98 % O2 Flow Rate: .O2 Flow Rate (L/min): 2 L/min  IO: Intake/output summary:   Intake/Output Summary (Last 24 hours) at 03/12/2019 1628 Last data filed at 03/12/2019 1400 Gross per 24 hour  Intake 1572.12 ml  Output 0 ml  Net 1572.12 ml    LBM: Last BM Date: 03/09/19 Baseline Weight: Weight: 56.5 kg Most recent weight: Weight: 55.5 kg     Palliative Assessment/Data: 10%     Time In: 3:45 Time Out: 4:45 Time Total: 60 min  Visit consisted of counseling and  education dealing with the complex and emotionally intense issues surrounding the need for palliative care and symptom management in the setting of serious and potentially life-threatening illness. Greater than 50%  of this time was spent counseling and coordinating care related to the above assessment and plan.  The above conversation was completed via telephone due to the restrictions during the COVID-19 pandemic. Thorough chart review and discussion with necessary members of the care team was completed as part of assessment. All issues were discussed and addressed but no physical exam was performed.  Signed by: Florentina Jenny, PA-C Palliative Medicine Pager: [phone]  Please contact Palliative Medicine Team phone at [phone] for questions and concerns.  For individual provider: See Shea Evans

## 2019-03-12 NOTE — Progress Notes (Signed)
  Echocardiogram 2D Echocardiogram has been performed.  Rhonda Lynch 03/12/2019, 11:27 AM

## 2019-03-12 NOTE — Progress Notes (Signed)
ANTICOAGULATION CONSULT NOTE  Pharmacy Consult:  Heparin Indication:  Afib/Aflutter  Allergies  Allergen Reactions  . Acetaminophen Nausea And Vomiting    Patient states Acetaminophen and Acetaminophen containing products make her nauseated. She demonstrated this 06/21/18 with nausea followed by emesis.  Marland Kitchen Hydroxyzine Itching    Patient Measurements: Weight: 122 lb 5.7 oz (55.5 kg) Heparin Dosing Weight: 54 kg  Vital Signs: Temp: 97.7 F (36.5 C) (07/02 0723) Temp Source: Axillary (07/02 0723) BP: 118/65 (07/02 0700) Pulse Rate: 44 (07/02 0723)  Labs: Recent Labs    03/10/19 0441 03/11/19 0402 03/11/19 2255 03/12/19 0448 03/12/19 0849  HGB 8.4* 9.3*  --  8.0*  --   HCT 26.8* 30.2*  --  25.8*  --   PLT 297 356  --  314  --   HEPARINUNFRC  --   --  <0.10*  --  0.24*  CREATININE 5.46* 7.04*  --  4.71*  --     Estimated Creatinine Clearance: 9 mL/min (A) (by C-G formula based on SCr of 4.71 mg/dL (H)).   Medical History: Past Medical History:  Diagnosis Date  . Diabetes mellitus without complication ([place])   . ESRD (end stage renal disease) on dialysis ([place])    "TTS; [facility]; they're moving me to another one" (09/22/2018)  . Hepatitis C    diagnosed 09/2018  . Hypertension   . Oxygen deficiency    2 L at night  . Stroke ([place])   . Substance abuse ([place])    has been clean for 4 months      Assessment: 57 YOF presented with SOB and volume overload.  Now in and out of Aflutter and Pharmacy consulted to dose IV heparin (CHADsVASc = 6).  MRI showed possible acute/early subacute infarction; hypoxic ischemic injury vs embolic phenomenon vs vasculitis; no hemorrhage.    Heparin level is trending up but remains sub-therapeutic.  No bleeding reported.  Goal of Therapy:  Heparin level 0.3 - 0.5 units/mL Monitor platelets by anticoagulation protocol: Yes   Plan:  Increase heparin gtt to 1050 units/hr Check 8 hr heparin level Daily heparin level and CBC  Shahida Schnackenberg  D. Mina Marble, PharmD, BCPS, [place] 03/12/2019, 11:00 AM

## 2019-03-12 NOTE — Progress Notes (Signed)
Attempted to call pts son to provide updates multiple times.  Unable to reach.

## 2019-03-12 NOTE — Progress Notes (Signed)
STROKE TEAM PROGRESS NOTE   INTERVAL HISTORY Pt lying in bed, still intubated. Able to move right UE and LE strong, weak on LLE and no movement LUE. Tele showed arrhythmia, HR from 40s to 120s.   Vitals:   03/12/19 0600 03/12/19 0700 03/12/19 0723 03/12/19 0820  BP: 138/73 118/65    Pulse: 70 (!) 46 (!) 44   Resp: 18 16 16    Temp:   97.7 F (36.5 C)   TempSrc:   Axillary   SpO2: 99% 99% 99% 99%  Weight:        CBC:  Recent Labs  Lab 03/11/19 0402 03/12/19 0448  WBC 16.8* 11.7*  HGB 9.3* 8.0*  HCT 30.2* 25.8*  MCV 72.9* 73.3*  PLT 356 572    Basic Metabolic Panel:  Recent Labs  Lab 03/07/19 0326  03/11/19 0402 03/11/19 0420 03/12/19 0448  NA 137   < > 133*  --  137  K 3.7   < > 3.9  --  3.7  CL 100   < > 91*  --  97*  CO2 26   < > 24  --  25  GLUCOSE 144*   < > 124*  --  182*  BUN 25*   < > 101*  --  59*  CREATININE 4.71*   < > 7.04*  --  4.71*  CALCIUM 8.8*   < > 9.0  --  8.4*  MG  --    < >  --  2.8* 2.4  PHOS 5.6*  --   --   --  4.3   < > = values in this interval not displayed.   Lipid Panel:     Component Value Date/Time   CHOL 140 01/12/2019 0242   TRIG 46 01/12/2019 0242   HDL 54 01/12/2019 0242   CHOLHDL 2.6 01/12/2019 0242   VLDL 9 01/12/2019 0242   LDLCALC 77 01/12/2019 0242   HgbA1c:  Lab Results  Component Value Date   HGBA1C 5.0 03/12/2019   Urine Drug Screen:     Component Value Date/Time   LABOPIA NONE DETECTED 12/03/2018 1208   COCAINSCRNUR POSITIVE (A) 12/03/2018 1208   LABBENZ NONE DETECTED 12/03/2018 1208   AMPHETMU NONE DETECTED 12/03/2018 1208   THCU NONE DETECTED 12/03/2018 1208   LABBARB NONE DETECTED 12/03/2018 1208    Alcohol Level     Component Value Date/Time   ETH <10 01/12/2019 1353    IMAGING Mr Brain Wo Contrast  Result Date: 03/11/2019 CLINICAL DATA:  63 y/o  F; left hemiplegia. EXAM: MRI HEAD WITHOUT CONTRAST TECHNIQUE: Multiplanar, multiecho pulse sequences of the brain and surrounding structures were  obtained without intravenous contrast. COMPARISON:  03/06/2019 CT head.  07/18/2018 MRI head. FINDINGS: Brain: Diffuse patchy areas of reduced diffusion throughout the bilateral cerebellar hemispheres, right cerebral hemisphere and MCA and PCA distributions. There are punctate foci of reduced diffusion within the left midbrain and right posterior pons. Moderate size foci of reduced diffusion are present in the right basal ganglia concentrated and caudate nucleus and thalamus. Several punctate foci are scattered throughout the left frontal and parietal lobes and there is more confluent reduced diffusion in the left occipital lobe. Small foci are present within the left caudate head and thalamus. Findings are compatible with acute/early subacute infarction. Areas of infarction are T2 hyperintense and demonstrate mild local mass effect. No new susceptibility hypointensity to indicate intracranial hemorrhage. No extra-axial collection, hydrocephalus, or herniation. Vascular: Normal flow voids. Skull and upper  cervical spine: Normal marrow signal. Sinuses/Orbits: Left maxillary sinus mucosal thickening. Partial opacification of right mastoid air cells. Debris within nasopharynx likely due to intubation. Orbits are unremarkable. Other: None. IMPRESSION: Diffuse patchy acute/early subacute infarction of the cerebellum, right cerebral hemisphere, left occipital lobe, and right greater than left basal ganglia. Punctate foci are present in the brainstem as well as elsewhere throughout the left cerebral hemisphere. No associated hemorrhage. Mild local mass effect. Diffuse involvement of the brain favors hypoxic ischemic injury, embolic phenomenon, or possibly vasculitis. These results will be called to the ordering clinician or representative by the Radiologist Assistant, and communication documented in the PACS or zVision Dashboard. Electronically Signed   By: Kristine Garbe M.D.   On: 03/11/2019 00:34   Dg Chest  Port 1 View  Result Date: 03/12/2019 CLINICAL DATA:  Acute on chronic respiratory failure EXAM: PORTABLE CHEST 1 VIEW COMPARISON:  Two days ago FINDINGS: Endotracheal tube tip at the clavicular heads. Right IJ line and dialysis catheter with tips at the right atrium. The orogastric tube tip and side-port reaches the stomach. Stable cardiomegaly. Stable interstitial coarsening with mild streaky density at the right base. No evidence of effusion or pneumothorax. IMPRESSION: 1. Higher endotracheal tube with tip at T2. 2. No significant change in aeration.  Stable cardiomegaly. Electronically Signed   By: Monte Fantasia M.D.   On: 03/12/2019 07:18   Dg Abd Portable 1v  Result Date: 03/11/2019 CLINICAL DATA:  NG tube placement EXAM: PORTABLE ABDOMEN - 1 VIEW COMPARISON:  03/06/2019 FINDINGS: Esophageal tube tip overlies the gastric body. Gas pattern is nonobstructed. Central venous catheter tips over the right atrium. IMPRESSION: 1. Esophageal tube tip overlies the gastric body. Electronically Signed   By: Donavan Foil M.D.   On: 03/11/2019 00:50   Long-term EEG This day 1 of continuous EEG monitoring with simultaneous video monitoring did not record any clinical or subclinical seizures.  EEG was abnormal due to disorganized background activity slowing suggestive of moderate encephalopathy of nonspecific etiologies including toxic metabolic pharmacological multifocal degenerative etiologies.  Occasionally triphasic waves are present however were not a prominent findings throughout the recording.  At times appeared to be more slowing across right hemisphere although asymmetry was rather mild.  Abnormal EKG noted throughout the recording.  Please see above discussion.  If concern for seizures persist continuous monitoring is recommended.  Clinical correlation is advised.  Spot EEG This is an abnormal electroencephalogram due to the presence of general background slowing and disorganization with bilateral,  independent triphasic waves.  This are seen most commonly in encephalopathic states.     PHYSICAL EXAM  Temp:  [97.7 F (36.5 C)-98.6 F (37 C)] 98.1 F (36.7 C) (07/02 1500) Pulse Rate:  [44-137] 77 (07/02 1700) Resp:  [11-22] 19 (07/02 1700) BP: (95-161)/(56-79) 134/57 (07/02 1700) SpO2:  [92 %-100 %] 98 % (07/02 1700) FiO2 (%):  [30 %-35 %] 30 % (07/02 1600) Weight:  [55.5 kg-55.6 kg] 55.5 kg (07/02 0438)  General - Well nourished, well developed, intubated off sedation.  Ophthalmologic - fundi not visualized due to noncooperation.  Cardiovascular - irregular heart rate and rhythm.  Neuro - intubated off sedation, eyes open, able to follow simple commands. Eye mid position, no lateral movement, not tracking, mild disconjugate, blinking to visual threat, PERRL. Corneal reflex present bilaterally, gag and cough present. Breathing over the vent.  Facial symmetry not able to test due to ET tube.  Tongue protrusion midline. RUE and RLE strong on movement, on  restrain. LUE 0/5 with increased tone. LLE 3/5 on pain stimulation. No babinski. Sensation, coordination and gait not tested.   ASSESSMENT/PLAN Ms. TIANE SZYDLOWSKI is a 62 y.o. female with history of ESRD on HD, chronic cocaine use, HTN, hep C, DB, tobacco use and prior history of CVA admitted for hypoxic respiratory failure due to bilateral pneumonia with stay complicated by pulmonary edema and fluid overload.  Noted to be difficult to arouse and have intermittent eye fluttering x2 minutes along with left-sided weakness.  Spot and LT EEG without seizures.  CT normal.  MRI showed acute/subacute cerebellar, L occipital and R greater than left basal ganglia, punctate brainstem and L cerebral hemisphere ischemia.  Stroke:   Bilateral anterior and posterior circulation patchy infarcts embolic secondary to atrial fibrillation/atrial flutter not on anticoagulation   CT head 6/26 no acute abnormality.  Small vessel disease.  MRI 6/30  diffuse patchy acute/subacute infarcts of the cerebellum, R cerebral hemisphere, L occipital, R> L basal ganglia with punctate brainstem and L cerebral hemisphere infarct  MRA pending to rule out mycotic aneurysm given previous suspicious TV vegetation (see below)  CUS pending   2D Echo EF > 65%. Moderate to severe TR  TEE 04/2018 no PFO  LDL pending   HgbA1c 5.0  IV heparin for VTE prophylaxis  aspirin 81 mg daily prior to admission, now on aspirin 81 mg daily and heparin IV.    Therapy recommendations: Pending  Disposition: Pending  ?? seizure   Intermittent eye fluttering x2 minutes  Spot EEG negative for seizure  LT EEG negative for seizure  Hold antiepileptics at this time  ??  Endocarditis with TV vegetation  04/2018 TEE showed EF 40 to 45% with suspicious TV vegetation  Serial blood culture has been negative  Current blood culture NGTD  However patient admitted for bilateral PNA and leukocytosis, with cardioembolic strokes, recommend TEE to rule out endocarditis once stable.  Hx of ICH  07/2018- small left caudate ICH due to HTN in setting of supratherapeutic INR of 3.4.  On Coumadin for PE at that time.  CTA head and neck negative.  LDL 226.  A1c 6.1. INR 3.4.  UDS positive for cocaine.   Acute hypoxic respiratory failure with respiratory arrest 6/26 Bilateral pneumonia  Emphysema  On home O2  Intubated  CCM on board  On steroids  Atrial Fibrillation / Flutter with RVR, new onset Intermittent bradycardia  Home anticoagulation:  None   Started on IV heparin  On IV amiodarone and metoprolol shift. . Will need oral anticoagulation at discharge for secondary stroke prevention . On Coumadin in the past for PE, now off   Hypertension   Stable  On hydralazine, isosorbide, metoprolol . Long-term BP goal normotensive  Diabetes type II controlled   HgbA1c 5.0, goal < 7.0  CBGs  SSI  On lantus  Dysphagia . Secondary to  stroke . NPO . Speech on board  Tobacco abuse  Current smoker  Smoking cessation counseling will be provided  On nicotine patch  Other Stroke Risk Factors  Advanced age  HX ETOH use  HX Chronic cocaine and heroin use, clean x 4 months  Acute systolic/diastolic congestive heart failure  Other Active Problems  ESRD on HD TTS, off schedule  Anemia of chronic disease status post transfusion x3 this admission  Hepatitis C  Leukocytosis without fever, steroids versus infection, decreasing  Hospital day # 7  This patient is critically ill due to stroke with embolic shower, respiratory failure,  a flutter/atrial fibrillation, tachycardia alternating with bradycardia, questionable for seizure, history of stroke and at significant risk of neurological worsening, death form recurrent stroke, hemorrhagic conversion, heart failure, respiratory failure, status epilepticus, DKA, and sepsis. This patient's care requires constant monitoring of vital signs, hemodynamics, respiratory and cardiac monitoring, review of multiple databases, neurological assessment, discussion with family, other specialists and medical decision making of high complexity. I spent 40 minutes of neurocritical care time in the care of this patient.  Rosalin Hawking, MD PhD Stroke Neurology 03/12/2019 6:02 PM   To contact Stroke Continuity provider, please refer to [email or web address] After hours, contact General Neurology

## 2019-03-13 ENCOUNTER — Inpatient Hospital Stay (HOSPITAL_COMMUNITY): Payer: Medicare Other

## 2019-03-13 DIAGNOSIS — Z515 Encounter for palliative care: Secondary | ICD-10-CM

## 2019-03-13 DIAGNOSIS — I6389 Other cerebral infarction: Secondary | ICD-10-CM

## 2019-03-13 DIAGNOSIS — J8489 Other specified interstitial pulmonary diseases: Secondary | ICD-10-CM

## 2019-03-13 DIAGNOSIS — J9620 Acute and chronic respiratory failure, unspecified whether with hypoxia or hypercapnia: Secondary | ICD-10-CM

## 2019-03-13 LAB — GLUCOSE, CAPILLARY
Glucose-Capillary: 102 mg/dL — ABNORMAL HIGH (ref 70–99)
Glucose-Capillary: 127 mg/dL — ABNORMAL HIGH (ref 70–99)
Glucose-Capillary: 131 mg/dL — ABNORMAL HIGH (ref 70–99)
Glucose-Capillary: 174 mg/dL — ABNORMAL HIGH (ref 70–99)
Glucose-Capillary: 180 mg/dL — ABNORMAL HIGH (ref 70–99)

## 2019-03-13 LAB — HEPARIN LEVEL (UNFRACTIONATED): Heparin Unfractionated: 0.33 IU/mL (ref 0.30–0.70)

## 2019-03-13 LAB — LIPID PANEL
Cholesterol: 189 mg/dL (ref 0–200)
HDL: 80 mg/dL (ref >40)
LDL Cholesterol: 88 mg/dL (ref 0–99)
Total CHOL/HDL Ratio: 2.4 RATIO
Triglycerides: 104 mg/dL (ref <150)
VLDL: 21 mg/dL (ref 0–40)

## 2019-03-13 LAB — CBC
HCT: 29.2 % — ABNORMAL LOW (ref 36.0–46.0)
Hemoglobin: 9.1 g/dL — ABNORMAL LOW (ref 12.0–15.0)
MCH: 22.9 pg — ABNORMAL LOW (ref 26.0–34.0)
MCHC: 31.2 g/dL (ref 30.0–36.0)
MCV: 73.4 fL — ABNORMAL LOW (ref 80.0–100.0)
Platelets: 399 10*3/uL (ref 150–400)
RBC: 3.98 MIL/uL (ref 3.87–5.11)
RDW: 24.8 % — ABNORMAL HIGH (ref 11.5–15.5)
WBC: 13.4 10*3/uL — ABNORMAL HIGH (ref 4.0–10.5)
nRBC: 0.5 % — ABNORMAL HIGH (ref 0.0–0.2)

## 2019-03-13 LAB — BASIC METABOLIC PANEL
Anion gap: 17 — ABNORMAL HIGH (ref 5–15)
BUN: 86 mg/dL — ABNORMAL HIGH (ref 8–23)
CO2: 24 mmol/L (ref 22–32)
Calcium: 9.2 mg/dL (ref 8.9–10.3)
Chloride: 95 mmol/L — ABNORMAL LOW (ref 98–111)
Creatinine, Ser: 6.09 mg/dL — ABNORMAL HIGH (ref 0.44–1.00)
GFR calc Af Amer: 8 mL/min — ABNORMAL LOW (ref >60)
GFR calc non Af Amer: 7 mL/min — ABNORMAL LOW (ref >60)
Glucose, Bld: 177 mg/dL — ABNORMAL HIGH (ref 70–99)
Potassium: 3.9 mmol/L (ref 3.5–5.1)
Sodium: 136 mmol/L (ref 135–145)

## 2019-03-13 MED ORDER — HALOPERIDOL LACTATE 2 MG/ML PO CONC
0.5000 mg | ORAL | Status: DC | PRN
  Filled 2019-03-13: qty 0.3

## 2019-03-13 MED ORDER — POLYVINYL ALCOHOL 1.4 % OP SOLN
1.0000 [drp] | Freq: Four times a day (QID) | OPHTHALMIC | Status: DC | PRN
  Filled 2019-03-13: qty 15

## 2019-03-13 MED ORDER — LIDOCAINE HCL (PF) 1 % IJ SOLN: 5.0000 mL | INTRAMUSCULAR | Status: DC | PRN

## 2019-03-13 MED ORDER — PENTAFLUOROPROP-TETRAFLUOROETH EX AERO: 1.0000 "application " | INHALATION_SPRAY | CUTANEOUS | Status: DC | PRN

## 2019-03-13 MED ORDER — SODIUM CHLORIDE 0.9 % IV SOLN
2.0000 mg/h | INTRAVENOUS | Status: DC
  Administered 2019-03-13: 17:00:00 1 mg/h via INTRAVENOUS
  Administered 2019-03-14 – 2019-03-15 (×2): 2 mg/h via INTRAVENOUS
  Filled 2019-03-13 (×3): qty 5

## 2019-03-13 MED ORDER — HEPARIN SODIUM (PORCINE) 1000 UNIT/ML IJ SOLN
INTRAMUSCULAR | Status: AC
  Filled 2019-03-13: qty 4

## 2019-03-13 MED ORDER — LORAZEPAM 2 MG/ML IJ SOLN
2.0000 mg | Freq: Once | INTRAMUSCULAR | Status: AC
  Administered 2019-03-13: 2 mg via INTRAVENOUS
  Filled 2019-03-13: qty 1

## 2019-03-13 MED ORDER — DEXTROSE 5 % IV SOLN: INTRAVENOUS | Status: DC

## 2019-03-13 MED ORDER — SODIUM CHLORIDE 0.9 % IV SOLN: 100.0000 mL | INTRAVENOUS | Status: DC | PRN

## 2019-03-13 MED ORDER — ONDANSETRON 4 MG PO TBDP
4.0000 mg | ORAL_TABLET | Freq: Four times a day (QID) | ORAL | Status: DC | PRN
  Filled 2019-03-13: qty 1

## 2019-03-13 MED ORDER — GLYCOPYRROLATE 1 MG PO TABS
1.0000 mg | ORAL_TABLET | ORAL | Status: DC | PRN
  Administered 2019-03-13: 1 mg via ORAL
  Filled 2019-03-13 (×2): qty 1

## 2019-03-13 MED ORDER — LIDOCAINE-PRILOCAINE 2.5-2.5 % EX CREA: 1.0000 "application " | TOPICAL_CREAM | CUTANEOUS | Status: DC | PRN

## 2019-03-13 MED ORDER — LORAZEPAM 2 MG/ML IJ SOLN
1.0000 mg | Freq: Four times a day (QID) | INTRAMUSCULAR | Status: DC
  Administered 2019-03-13 – 2019-03-15 (×10): 1 mg via INTRAVENOUS
  Filled 2019-03-13 (×10): qty 1

## 2019-03-13 MED ORDER — HEPARIN SODIUM (PORCINE) 1000 UNIT/ML IJ SOLN
3.4000 mL | Freq: Once | INTRAMUSCULAR | Status: AC
  Administered 2019-03-13: 3400 [IU] via INTRAVENOUS

## 2019-03-13 MED ORDER — GLYCOPYRROLATE 0.2 MG/ML IJ SOLN: 0.2000 mg | INTRAMUSCULAR | Status: DC | PRN

## 2019-03-13 MED ORDER — ONDANSETRON HCL 4 MG/2ML IJ SOLN: 4.0000 mg | Freq: Four times a day (QID) | INTRAMUSCULAR | Status: DC | PRN

## 2019-03-13 MED ORDER — HALOPERIDOL 0.5 MG PO TABS
0.5000 mg | ORAL_TABLET | ORAL | Status: DC | PRN
  Filled 2019-03-13: qty 1

## 2019-03-13 MED ORDER — HEPARIN SODIUM (PORCINE) 1000 UNIT/ML DIALYSIS: 1000.0000 [IU] | INTRAMUSCULAR | Status: DC | PRN

## 2019-03-13 MED ORDER — LORAZEPAM 1 MG PO TABS: 1.0000 mg | ORAL_TABLET | ORAL | Status: DC | PRN

## 2019-03-13 MED ORDER — HALOPERIDOL LACTATE 5 MG/ML IJ SOLN: 0.5000 mg | INTRAMUSCULAR | Status: DC | PRN

## 2019-03-13 MED ORDER — LORAZEPAM 2 MG/ML PO CONC: 1.0000 mg | ORAL | Status: DC | PRN

## 2019-03-13 MED ORDER — HEPARIN SODIUM (PORCINE) 1000 UNIT/ML IJ SOLN: 3.2000 mL | Freq: Once | INTRAMUSCULAR | Status: DC

## 2019-03-13 MED ORDER — LIDOCAINE-PRILOCAINE 2.5-2.5 % EX CREA
1.0000 "application " | TOPICAL_CREAM | CUTANEOUS | Status: DC | PRN
  Filled 2019-03-13: qty 5

## 2019-03-13 MED ORDER — ALTEPLASE 2 MG IJ SOLR: 2.0000 mg | Freq: Once | INTRAMUSCULAR | Status: DC | PRN

## 2019-03-13 MED ORDER — ATORVASTATIN CALCIUM 10 MG PO TABS
10.0000 mg | ORAL_TABLET | Freq: Every day | ORAL | Status: DC
  Filled 2019-03-13: qty 1

## 2019-03-13 MED ORDER — HYDROXYZINE HCL 10 MG/5ML PO SYRP
10.0000 mg | ORAL_SOLUTION | Freq: Three times a day (TID) | ORAL | Status: DC | PRN
  Administered 2019-03-13: 10 mg
  Filled 2019-03-13 (×2): qty 5

## 2019-03-13 MED ORDER — ALTEPLASE 2 MG IJ SOLR
2.0000 mg | Freq: Once | INTRAMUSCULAR | Status: DC | PRN
  Filled 2019-03-13: qty 2

## 2019-03-13 MED ORDER — LORAZEPAM 2 MG/ML IJ SOLN: 1.0000 mg | INTRAMUSCULAR | Status: DC | PRN

## 2019-03-13 MED ORDER — HYDROMORPHONE BOLUS VIA INFUSION
1.0000 mg | INTRAVENOUS | Status: DC | PRN
  Administered 2019-03-13 (×3): 2 mg via INTRAVENOUS
  Administered 2019-03-14 (×2): 1 mg via INTRAVENOUS
  Filled 2019-03-13: qty 2

## 2019-03-13 MED ORDER — BIOTENE DRY MOUTH MT LIQD: 15.0000 mL | OROMUCOSAL | Status: DC | PRN

## 2019-03-13 NOTE — Progress Notes (Addendum)
NAME:  Rhonda Lynch, MRN:  [id number], DOB:  1956-01-14, LOS: 8 ADMISSION DATE:  03/01/2019, CONSULTATION DATE:  02/19/2019 REFERRING MD:  Teryl Lucy - [facility], CHIEF COMPLAINT:  AMS, acute on chronic hypoxia  Brief History   63 yo FM, ESRD, chronic cocaine use, tobacco use, admitted for hypoxic respiratory failure due to BL pna, CT imaging with peri-bronchovascular infiltrates, proximal perlymphatic and perihilar distribution with intralobular septal thickening and BL GGO.  On review, infiltrates dating back to 11/2018 on CT scans, CT abdomen from 10/2018 shows clear lungs Had respiratory arrest 6/26  Significant Hospital Events   03/05/2019 hypoxic event 6/26 respiratory arrest on dialysis, did not lose pulse 7/1 MRI reveals new evidence of CVA 7/2 Palliative Care consulted   Consults:  Nephrology Neurology Palliative Care  Procedures:  ETT 6/26 >> RIJ 6/26 >>  Significant Diagnostic Tests:  03/05/2019 CT scan again shows bilateral airspace disease and cardiomegaly 6/26 head CT neg  EEG 6/29 >> slow triphasic waves , no seizure Chest x-ray 6/30  improved bilateral infiltrates MRI brain 6/30> diffuse patchy acute/subacute infarct of cerebellum, R cerebral hemisphere, L occipital lobe, R>L basal ganglia. Punctate foci in brainstem and throughout L cerebral hemisphere. No hemorrhage. Local mass effect. Diffuse involvement suggests hypoxic ischemic injury vs embolic phenomenon vs vasculitis per radiology read.   CXR 7/2> stable cardiomegaly, interstitial coarsening. No effusion, no pneumothorax. ETT terminates at clavicular heads ECHO 7/2> LVEF >65%, RV mildly reduced systolic function. Moderate-severe tricuspid regurgitation. Dilated IVC.  CXR 7/3> ETT advanced in good position, slight decrease in lung volumes  MRA Head non-con >>>   Micro Data:  Tracheal aspirate 6/29> no organisms seen  resp cx 6/26 > neg [facility] 6/26 >> neg  SARS Cov 2 6/24> neg   Antimicrobials:  6/26 zosyn >> 6/30   Interim history/subjective:  NAEO Tolerating PSV/CPAP wean again this morning  Objective   Blood pressure (!) 114/50, pulse 65, temperature 98 F (36.7 C), temperature source Oral, resp. rate 13, weight 56.1 kg, SpO2 100 %.    Vent Mode: CPAP;PSV FiO2 (%):  [30 %] 30 % Set Rate:  [15 bmp-16 bmp] 15 bmp Vt Set:  [380 mL] 380 mL PEEP:  [5 cmH20] 5 cmH20 Pressure Support:  [10 cmH20] 10 cmH20 Plateau Pressure:  [10 cmH20-17 cmH20] 13 cmH20   Intake/Output Summary (Last 24 hours) at 03/13/2019 0809 Last data filed at 03/13/2019 0600 Gross per 24 hour  Intake 1520.04 ml  Output -  Net 1520.04 ml   Filed Weights   03/11/19 2039 03/12/19 0438 03/13/19 0500  Weight: 55.6 kg 55.5 kg 56.1 kg    Examination: General: Chronically ill appearing older adult F, intubated, moving RUE RLE spontaneously HENT: NCAT pink mmm. Trachea midline, pink mmm  Lungs: Scattered rhonchi. Symmetrical chest expansion. No accessory muscle recruitment  Cardiovascular: RRR s1s2 no rgm. No JVD. Capillary refill < 3 seconds  Abdomen: Soft, round, ndnt. + bowel sounds  Extremities: Symmetrical bulk and muscle tone, no obvious joint deformity. No clubbing no cyanosis   Neuro: Opens eyes spontaneously. Does not follow commands. Moves RUE RLE spontaneously.  Skin: Clean, dry, warm, without rash   Resolved Hospital Problem list     Assessment & Plan:   Acute on chronic respiratory failure with hypoxia requiring intubation  Bilateral infiltrates/diffuse lung disease -dates back to 11/2018, attributed to chronic lung disease, drug screen positive for cocaine in 11/2018 not checked this admit Differential includes ILD less likely and pulmonary edema due to  Volume overload, BOOP -s/p 5 day course of pip tazo P -Continue to wean vent settings as able and do daily SBT/WUA -Has been tolerating PSV/CPAP weans however mental status will preclude extubation  -SpO2 goal 88-92% -Discontinuing solumedrol (this is following  solumedrol wean)  -BDs -Patient's mental status will preclude extubation, and the patient is a poor trach candidate. If a tracheostomy is pursued, the patient will likely not achieve decannulation.  Acute systolic/diastolic HF Echo 03/3531 shows EF 45%, RVSP 49 Repeat ECHO 03/12/19 shows LVEF 65%  Atrial Flutter, improved  -appears NSR s/p repeat amio bolus, iHD Intermittent bradycardia without hypotension  P -Continue tele  -ASA -Amiodarone -Heparin per pharmacy  -metoprolol, isordil, PRN hydralazine (hold antihypertensives if HR < 60 )  Acute encephalopathy  -No seizure on EEG, LTM subsequently discontinued  History of CVA -CT head 6/26 neg -LFTs, ammonia WNL  Patchy acute/subacute infarct of cerebellum, R cerebral hemisphere, L occipital lobe, R>L basal ganglia. Punctate foci in brainstem and throughout L cerebral hemisphere  -MRI brain 7/1: Diffuse involvement suggests hypoxic ischemic injury vs embolic phenomenon vs vasculitis per radiology read.   P -Neurology following appreciate recs -MRA ordered per neuro -RN delirium precaution bundle -Metabolic correction with iHD  ESRD P -Nephrology following appreciate recs - iHD planned for 7/3  -AM metabolic panel   DM D9M 5.0 03/11/2019 P -SSI, Lantus  Leukocytosis Etiology steroids vs reactive vs infection P Trend WBC If febrile, pan culture consider abx  Goals of Care: I spoke with Vonna Kotyk, patient's son, 03/12/2019 and provided updates regarding clinical condition and plan of care We approached the topic of goals of care-- patient's son states "I am just too shocked."  I advised the patient that Palliative Care will reach out to him to discuss goals of care. From clinical standpoint, patient with severe chronic medical conditions and is dialysis dependent. In setting of acute CVAs, patient with impaired neuro exam and is not safe for extubation. Additionally patient is a poor candidate for tracheostomy and would be  unlikely to achieve decannulation, however if this trach/peg level of care is desired patient's long-term care scenario will involve placement at a facility that can accommodate trach and dialysis   Best practice:  Diet: EN Pain/Anxiety/Delirium protocol (if indicated): PRN fent VAP protocol (if indicated): yes DVT prophylaxis: Heparin gtt pharmacy  GI prophylaxis: protonix  Glucose control: SSI + Lantus  Mobility: bedrest  Code Status: Full  Family Communication: Son updated 7/2, have not been able to reach 7/3 but will keep trying. Palliative care also attempting to reach son.  Disposition: ICU   Labs   CBC: Recent Labs  Lab 03/09/19 0442 03/10/19 0441 03/11/19 0402 03/12/19 0448 03/13/19 0344  WBC 16.9* 16.2* 16.8* 11.7* 13.4*  HGB 8.7* 8.4* 9.3* 8.0* 9.1*  HCT 27.8* 26.8* 30.2* 25.8* 29.2*  MCV 73.2* 72.6* 72.9* 73.3* 73.4*  PLT 318 297 356 314 426    Basic Metabolic Panel: Recent Labs  Lab 03/07/19 0326  03/09/19 0442 03/09/19 2228 03/10/19 0441 03/11/19 0402 03/11/19 0420 03/12/19 0448 03/13/19 0344  NA 137   < > 135  --  133* 133*  --  137 136  K 3.7   < > 4.5 4.0 4.3 3.9  --  3.7 3.9  CL 100   < > 93*  --  93* 91*  --  97* 95*  CO2 26   < > 22  --  25 24  --  25 24  GLUCOSE 144*   < >  217*  --  198* 124*  --  182* 177*  BUN 25*   < > 94*  --  63* 101*  --  59* 86*  CREATININE 4.71*   < > 7.89*  --  5.46* 7.04*  --  4.71* 6.09*  CALCIUM 8.8*   < > 8.6*  --  8.7* 9.0  --  8.4* 9.2  MG  --   --   --  2.3  --   --  2.8* 2.4  --   PHOS 5.6*  --   --   --   --   --   --  4.3  --    < > = values in this interval not displayed.   GFR: Estimated Creatinine Clearance: 6.9 mL/min (A) (by C-G formula based on SCr of 6.09 mg/dL (H)). Recent Labs  Lab 03/07/19 0326  03/08/19 0902 03/09/19 0442 03/10/19 0441 03/11/19 0402 03/12/19 0448 03/13/19 0344  PROCALCITON  --   --  2.87 2.30 1.99  --   --   --   WBC 10.9*   < >  --  16.9* 16.2* 16.8* 11.7* 13.4*   LATICACIDVEN 0.9  --   --   --   --   --   --   --    < > = values in this interval not displayed.    Liver Function Tests: Recent Labs  Lab 03/07/19 0326 03/12/19 0448  AST  --  28  ALT  --  23  ALKPHOS  --  71  BILITOT  --  0.2*  PROT  --  6.6  ALBUMIN 2.0* 1.8*   No results for input(s): LIPASE, AMYLASE in the last 168 hours. Recent Labs  Lab 03/12/19 0448  AMMONIA 20    ABG    Component Value Date/Time   PHART 7.385 03/06/2019 2017   PCO2ART 50.6 (H) 03/06/2019 2017   PO2ART 396.0 (H) 03/06/2019 2017   HCO3 30.3 (H) 03/06/2019 2017   TCO2 32 03/06/2019 2017   ACIDBASEDEF 4.0 (H) 01/11/2019 1800   O2SAT 100.0 03/06/2019 2017     Coagulation Profile: No results for input(s): INR, PROTIME in the last 168 hours.  Cardiac Enzymes: No results for input(s): CKTOTAL, CKMB, CKMBINDEX, TROPONINI in the last 168 hours.  HbA1C: Hgb A1c MFr Bld  Date/Time Value Ref Range Status  03/12/2019 08:00 AM 5.0 4.8 - 5.6 % Final    Comment:    (NOTE) Pre diabetes:          5.7%-6.4% Diabetes:              >6.4% Glycemic control for   <7.0% adults with diabetes   12/29/2018 04:20 AM 4.8 4.8 - 5.6 % Final    Comment:    (NOTE)         Prediabetes: 5.7 - 6.4         Diabetes: >6.4         Glycemic control for adults with diabetes: <7.0     CBG: Recent Labs  Lab 03/12/19 1523 03/12/19 1930 03/13/19 0029 03/13/19 0435 03/13/19 0754  GLUCAP 272* 199* 174* 180* 131*   Critical care time: 35 minutes     Eliseo Gum MSN, AGACNP-BC [place] [phone] If no answer, [phone] 03/13/2019, 8:09 AM  Attending Note:  63 year old female with very extensive PMH history who presents to PCCM with respiratory failure post CVA that is now unable to protect her airway.  Overnight, no  events.  On exam, she is weaning but mental status precludes extubation.  I reviewed CXR myself, ETT is in a good position.  Discussed with PCCM-NP.  Discussed  with PCCM-NP.  Spoke with son yesterday, he was shocked to hear about the stroke.  The patient is not improving.  Neurology consulted, appreciate input.  Palliative care consulted as patient is not a tracheostomy candidate.  Will await palliative communication with the family.  I called Mr. Lupie Sawa (son) on both numbers available with no response, both voice mail boxes are full, unable to leave message.  Will ask CSW to arrange meeting if palliative is unsuccessful.  If no progress by Monday will consult ethics.    The patient is critically ill with multiple organ systems failure and requires high complexity decision making for assessment and support, frequent evaluation and titration of therapies, application of advanced monitoring technologies and extensive interpretation of multiple databases.   Critical Care Time devoted to patient care services described in this note is  32  Minutes. This time reflects time of care of this signee Dr Jennet Maduro. This critical care time does not reflect procedure time, or teaching time or supervisory time of PA/NP/Med student/Med Resident etc but could involve care discussion time.  Rush Farmer, M.D. [facility] Pulmonary/Critical Care Medicine. Pager: [phone]. After hours pager: [phone].

## 2019-03-13 NOTE — Progress Notes (Signed)
Pt terminally extubated per MD order. Family, RT and RN in room.

## 2019-03-13 NOTE — Progress Notes (Signed)
Palliative care medicine met with family, family wishes to withdraw life supporting measures and proceed with comfort.  NP from [facility] will place orders.  PCCM will be on a support role.  Rush Farmer, M.D. [facility] Pulmonary/Critical Care Medicine. Pager: [phone]. After hours pager: [phone]

## 2019-03-13 NOTE — Progress Notes (Signed)
CDS referral initiated. The referral number is [id number].

## 2019-03-13 NOTE — Plan of Care (Signed)
Palliative care note noted. Pt family requested comfort care measure, pt terminally extubated, now on comfort care measures. Neuro will sign off please call with questions. Thanks for the consult.  Rosalin Hawking, MD PhD Stroke Neurology 03/13/2019 6:43 PM

## 2019-03-13 NOTE — Progress Notes (Signed)
STROKE TEAM PROGRESS NOTE   INTERVAL HISTORY Pt is on HD in room. Slightly more lethargic than yesterday but arousable with painful stimulation. MRA not done yet. Pt not cooperative with carotid doppler testing.   Vitals:   03/13/19 1155 03/13/19 1200 03/13/19 1230 03/13/19 1245  BP: (!) 92/55 (!) 97/56 (!) 97/56 (!) 79/46  Pulse: 61 61 (!) 59 (!) 59  Resp: 13 15 14 13   Temp:      TempSrc:      SpO2:  99%    Weight:        CBC:  Recent Labs  Lab 03/12/19 0448 03/13/19 0344  WBC 11.7* 13.4*  HGB 8.0* 9.1*  HCT 25.8* 29.2*  MCV 73.3* 73.4*  PLT 314 557    Basic Metabolic Panel:  Recent Labs  Lab 03/07/19 0326  03/11/19 0420 03/12/19 0448 03/13/19 0344  NA 137   < >  --  137 136  K 3.7   < >  --  3.7 3.9  CL 100   < >  --  97* 95*  CO2 26   < >  --  25 24  GLUCOSE 144*   < >  --  182* 177*  BUN 25*   < >  --  59* 86*  CREATININE 4.71*   < >  --  4.71* 6.09*  CALCIUM 8.8*   < >  --  8.4* 9.2  MG  --    < > 2.8* 2.4  --   PHOS 5.6*  --   --  4.3  --    < > = values in this interval not displayed.   Lipid Panel:     Component Value Date/Time   CHOL 189 03/13/2019 0344   TRIG 104 03/13/2019 0344   HDL 80 03/13/2019 0344   CHOLHDL 2.4 03/13/2019 0344   VLDL 21 03/13/2019 0344   LDLCALC 88 03/13/2019 0344   HgbA1c:  Lab Results  Component Value Date   HGBA1C 5.0 03/12/2019   Urine Drug Screen:     Component Value Date/Time   LABOPIA NONE DETECTED 12/03/2018 1208   COCAINSCRNUR POSITIVE (A) 12/03/2018 1208   LABBENZ NONE DETECTED 12/03/2018 1208   AMPHETMU NONE DETECTED 12/03/2018 1208   THCU NONE DETECTED 12/03/2018 1208   LABBARB NONE DETECTED 12/03/2018 1208    Alcohol Level     Component Value Date/Time   ETH <10 01/12/2019 1353    IMAGING Dg Chest Port 1 View  Result Date: 03/13/2019 CLINICAL DATA:  Acute respiratory failure. EXAM: PORTABLE CHEST 1 VIEW COMPARISON:  Chest x-ray from yesterday. FINDINGS: Slight advancement of the endotracheal  tube with the tip now 4.3 cm above the carina. Unchanged enteric tube. Unchanged right internal jugular central venous and tunneled dialysis catheters. Stable cardiomegaly. Slightly lower lung volumes today with stable diffuse interstitial coarsening. Unchanged mild streaky opacity at the right lung base. No pleural effusion or pneumothorax. No acute osseous abnormality. IMPRESSION: 1. Slight advancement of the endotracheal tube, in good position. Otherwise stable lines and tubes. 2. Slightly decreased lung volumes with unchanged diffuse interstitial coarsening and right basilar atelectasis. Electronically Signed   By: Titus Dubin M.D.   On: 03/13/2019 07:00   Dg Chest Port 1 View  Result Date: 03/12/2019 CLINICAL DATA:  Acute on chronic respiratory failure EXAM: PORTABLE CHEST 1 VIEW COMPARISON:  Two days ago FINDINGS: Endotracheal tube tip at the clavicular heads. Right IJ line and dialysis catheter with tips at the right atrium.  The orogastric tube tip and side-port reaches the stomach. Stable cardiomegaly. Stable interstitial coarsening with mild streaky density at the right base. No evidence of effusion or pneumothorax. IMPRESSION: 1. Higher endotracheal tube with tip at T2. 2. No significant change in aeration.  Stable cardiomegaly. Electronically Signed   By: Monte Fantasia M.D.   On: 03/12/2019 07:18   Long-term EEG This day 1 of continuous EEG monitoring with simultaneous video monitoring did not record any clinical or subclinical seizures.  EEG was abnormal due to disorganized background activity slowing suggestive of moderate encephalopathy of nonspecific etiologies including toxic metabolic pharmacological multifocal degenerative etiologies.  Occasionally triphasic waves are present however were not a prominent findings throughout the recording.  At times appeared to be more slowing across right hemisphere although asymmetry was rather mild.  Abnormal EKG noted throughout the recording.   Please see above discussion.  If concern for seizures persist continuous monitoring is recommended.  Clinical correlation is advised.  Spot EEG This is an abnormal electroencephalogram due to the presence of general background slowing and disorganization with bilateral, independent triphasic waves.  This are seen most commonly in encephalopathic states.     PHYSICAL EXAM  Temp:  [97.6 F (36.4 C)-98.1 F (36.7 C)] 97.6 F (36.4 C) (07/03 1130) Pulse Rate:  [45-118] 59 (07/03 1245) Resp:  [11-24] 13 (07/03 1245) BP: (79-165)/(46-88) 79/46 (07/03 1245) SpO2:  [97 %-100 %] 99 % (07/03 1200) FiO2 (%):  [30 %] 30 % (07/03 1130) Weight:  [56.1 kg] 56.1 kg (07/03 0500)  General - Well nourished, well developed, intubated off sedation.  Ophthalmologic - fundi not visualized due to noncooperation.  Cardiovascular - irregular heart rate and rhythm.  Neuro - intubated no sedation, more lethargic today, eyes close but open with pain stimulation, not following commands. Eye mid position, no lateral movement, not tracking, disconjugate, not blinking to visual threat, PERRL. Corneal reflex present bilaterally, gag and cough present. Breathing over the vent.  Facial symmetry not able to test due to ET tube.  Tongue protrusion not cooperative. RUE withdraw to pain at least 3/5 and RLE strong on movement with pain, still on restrain. LUE 0/5 with increased tone. LLE 2/5 on pain stimulation. No babinski. Sensation, coordination and gait not tested.   ASSESSMENT/PLAN Rhonda Lynch is a 63 y.o. female with history of ESRD on HD, chronic cocaine use, HTN, hep C, DB, tobacco use and prior history of CVA admitted for hypoxic respiratory failure due to bilateral pneumonia with stay complicated by pulmonary edema and fluid overload.  Noted to be difficult to arouse and have intermittent eye fluttering x2 minutes along with left-sided weakness.  Spot and LT EEG without seizures.  CT normal.  MRI showed  acute/subacute cerebellar, L occipital and R greater than left basal ganglia, punctate brainstem and L cerebral hemisphere ischemia.  Stroke:   Bilateral anterior and posterior circulation patchy infarcts embolic secondary to atrial fibrillation/atrial flutter not on anticoagulation   CT head 6/26 no acute abnormality.  Small vessel disease.  MRI 6/30 diffuse patchy acute/subacute infarcts of the cerebellum, R cerebral hemisphere, L occipital, R> L basal ganglia with punctate brainstem and L cerebral hemisphere infarct  Unable to get large enough IV for CTA  MRA pending to rule out mycotic aneurysm given previous suspicious TV vegetation (see below)   CUS canceled due to incooperation, re-order once stable and cooperative  2D Echo EF > 65%. Moderate to severe TR  TEE 04/2018 no PFO  LDL  88   HgbA1c 5.0  IV heparin for VTE prophylaxis  aspirin 81 mg daily prior to admission, now on aspirin 81 mg daily and heparin IV.    Therapy recommendations: Pending  Disposition: Pending  Poor neuro prognosis in setting of medical medical conditions. Agree with palliative care consult. Ethics consulted planned for Monday if family cannot be reached.  ?? seizure   Intermittent eye fluttering x2 minutes  Spot EEG negative for seizure  LT EEG negative for seizure  Hold antiepileptics at this time  ??  Endocarditis with TV vegetation  04/2018 TEE showed EF 40 to 45% with suspicious TV vegetation  Serial blood culture has been negative  Current blood culture NGTD  However patient admitted for bilateral PNA and leukocytosis, with cardioembolic strokes, recommend TEE to rule out endocarditis once stable.  Hx of ICH  07/2018- small left caudate ICH due to HTN in setting of supratherapeutic INR of 3.4.  On Coumadin for PE at that time.  CTA head and neck negative.  LDL 226.  A1c 6.1. INR 3.4.  UDS positive for cocaine.   Acute hypoxic respiratory failure with respiratory arrest  6/26 Bilateral pneumonia  Emphysema  On home O2  Intubated  On steroids  Mental status precludes extubation  Poor trach candidate, unlikely to be able to decannulate  CCM on board  Palliative care consult  Atrial Fibrillation / Flutter with RVR, new onset Intermittent bradycardia  Home anticoagulation:  None   Started on IV heparin  On IV amiodarone and metoprolol  . Will need oral anticoagulation at discharge for secondary stroke prevention . On Coumadin in the past for PE, now off   Hypertension   Stable  On hydralazine, isosorbide, metoprolol . Long-term BP goal normotensive  Hyperlipidemia  LDL 88, goal LDL < 70   on no statin PTA  now on no statin  Added low dose statin (not high intensity given multiple comorbidities)  Diabetes type II controlled   HgbA1c 5.0, goal < 7.0  CBGs  SSI  On lantus  Dysphagia . Secondary to stroke . NPO . Speech on board  Tobacco abuse  Current smoker  Smoking cessation counseling will be provided  On nicotine patch  Other Stroke Risk Factors  Advanced age  74 ETOH use  HX Chronic cocaine and heroin use, clean x 4 months  Acute systolic/diastolic congestive heart failure EF 45%  Other Active Problems  ESRD on HD TTS, off schedule  Anemia of chronic disease status post transfusion x3 this admission  Hepatitis C  Leukocytosis without fever, steroids versus infection, decreasing  Hospital day # 8  This patient is critically ill due to stroke with embolic shower, respiratory failure, a flutter/atrial fibrillation, tachycardia alternating with bradycardia, questionable for seizure, history of stroke and at significant risk of neurological worsening, death form recurrent stroke, hemorrhagic conversion, heart failure, respiratory failure, status epilepticus, DKA, and sepsis. This patient's care requires constant monitoring of vital signs, hemodynamics, respiratory and cardiac monitoring, review of  multiple databases, neurological assessment, discussion with family, other specialists and medical decision making of high complexity. I spent 40 minutes of neurocritical care time in the care of this patient.  Rosalin Hawking, MD PhD Stroke Neurology 03/13/2019 12:56 PM   To contact Stroke Continuity provider, please refer to [email or web address] After hours, contact General Neurology

## 2019-03-13 NOTE — Progress Notes (Signed)
ANTICOAGULATION CONSULT NOTE  Pharmacy Consult:  Heparin Indication:  Afib/Aflutter  Patient Measurements: Weight: 123 lb 10.9 oz (56.1 kg) Heparin Dosing Weight: 54 kg  Vital Signs: Temp: 98 F (36.7 C) (07/03 0730) Temp Source: Oral (07/03 0730) BP: 144/88 (07/03 1000) Pulse Rate: 96 (07/03 1000)  Labs: Recent Labs    03/11/19 0402  03/12/19 0448 03/12/19 0849 03/12/19 2027 03/13/19 0344 03/13/19 0835  HGB 9.3*  --  8.0*  --   --  9.1*  --   HCT 30.2*  --  25.8*  --   --  29.2*  --   PLT 356  --  314  --   --  399  --   HEPARINUNFRC  --    < >  --  0.24* 0.30  --  0.33  CREATININE 7.04*  --  4.71*  --   --  6.09*  --    < > = values in this interval not displayed.    Estimated Creatinine Clearance: 6.9 mL/min (A) (by C-G formula based on SCr of 6.09 mg/dL (H)).  Assessment: 44 YOF presented with SOB and volume overload.  Now in and out of Aflutter and Pharmacy consulted to dose IV heparin (CHADsVASc = 6).  MRI showed possible acute/early subacute infarction; hypoxic ischemic injury vs embolic phenomenon vs vasculitis; no hemorrhage.    Heparin level this morning remains therapeutic (HL 0.33 << 0.3). CBC stable - no bleeding noted.   Goal of Therapy:  Heparin level 0.3 - 0.5 units/mL Monitor platelets by anticoagulation protocol: Yes   Plan:  - Continue Heparin at 1050 units/hr (10.5 ml/hr) - Will continue to monitor for any signs/symptoms of bleeding and will follow up with heparin level in the a.m.   Thank you for allowing pharmacy to be a part of this patient's care.  Alycia Rossetti, PharmD, BCPS Clinical Pharmacist Clinical phone for 03/13/2019: [phone] 03/13/2019 11:02 AM   **Pharmacist phone directory can now be found on [email or web address] ([email or web address]).  Listed under [facility].

## 2019-03-13 NOTE — Progress Notes (Signed)
[facility] ROUNDING NOTE   Subjective:   This is a very pleasant 63 year old lady that was admitted with shortness of breath pulmonary consolidation intubated by ICU was started on empiric antibiotics and IV steroids.  She was diagnosed with hypoxic respiratory failure due to bilateral pneumonia.  She is a Tuesday Thursday Saturday dialysis patient that is currently off schedule and is receiving dialysis Monday, 03/09/2019.  She has a right forearm AV graft and a hemodialysis catheter.  She has a history of cocaine abuse, tobacco abuse and emphysema.  She also has a history of hepatitis C. she has had several runs of atrial fibrillation.  She continues to be combative.  The does not appear to be much in way of movement in her left upper and lower extremity.  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  There is no associated hemorrhage and mild local mass-effect.  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  EEG revealed no seizure activity.  Appreciate assistance from Dr. Erlinda Hong neurology and from Florentina Jenny for palliative medicine.  It appears that ongoing discussions are to be had regarding the appropriateness of continue dialysis support.  Blood pressure 114/50 pulse 63 temperature 97.9 O2 sats 99% FiO2 30%  IV amiodarone  Sodium 136 potassium 3.9 chloride 95 CO2 24 BUN 86 creatinine 6.09 glucose 177 calcium 9.2 WBC 13.4 hemoglobin 9.1 platelets 399  Status post transfusion 2 units packed red blood cells 03/06/2019  Aspirin 81 mg daily, Nepro 1000 cc every 24 hours, Protostat 30 cc twice daily,hydralazine 50 mg 3 times daily, insulin sliding scale, Lantus 10 units nightly, isosorbide 20 mg twice daily,  Solu-Medrol 40 mg daily, nicotine patch every 24 hours, Protonix 40 mg daily, Sevelamer 1.6 g 3 times daily, calcitriol 0.25 mcg Monday Wednesday Friday, darbepoetin 100 mcg subcu q. Monday, metoprolol 12.5 mg twice daily    IV amiodarone   Chest x-ray stable 03/10/2019 stable cardiomegaly slightly improved interstitial prominence 03/10/2019  2D echo 01/12/2019 EF 40 to 45% severely dilated left atrium and small pericardial effusion.  There is mild valvular thickening  MRI 03/11/2019 diffuse patchy subacute infarction of cerebellum right cerebral hemisphere left occipital lobe right greater than left basal ganglia with punctate foci in the brainstem.   CT scan of chest 03/05/2019 small pericardial effusion new compared to previous study, dense peribronchial vascular [name] and consolidative airspace opacities bilaterally worsened since 02/03/2019 with underlying centrilobular emphysema small bilateral pleural effusions.  Abdominal x-ray OG tube projecting over the patient's gastric body  Objective:  Vital signs in last 24 hours:  Temp:  [97.9 F (36.6 C)-98.1 F (36.7 C)] 97.9 F (36.6 C) (07/03 0435) Pulse Rate:  [45-137] 63 (07/03 0723) Resp:  [11-24] 16 (07/03 0723) BP: (92-165)/(52-81) 92/52 (07/03 0600) SpO2:  [97 %-100 %] 99 % (07/03 0723) FiO2 (%):  [30 %] 30 % (07/03 0723) Weight:  [56.1 kg] 56.1 kg (07/03 0500)  Weight change: 0.5 kg Filed Weights   03/11/19 2039 03/12/19 0438 03/13/19 0500  Weight: 55.6 kg 55.5 kg 56.1 kg    Intake/Output: I/O last 3 completed shifts: In: 2617.5 [I.V.:1237.5; NG/GT:1380] Out: 0    Intake/Output this shift:  No intake/output data recorded. Intubated and sedated CVS-regular at present in sinus rhythm  RS-scattered rhonchorous breath sounds ventilator ABD- BS present soft non-distended OG tube EXT- no edema right AV graft with thrill bruit right [facility]   Basic Metabolic Panel: Recent Labs  Lab 03/07/19 0326  03/09/19 0442  03/09/19 2228 03/10/19 0441 03/11/19 0402 03/11/19 0420 03/12/19 0448 03/13/19 0344  NA 137   < > 135  --  133* 133*  --  137 136  K 3.7   < > 4.5 4.0 4.3 3.9  --  3.7 3.9  CL 100   < > 93*  --  93* 91*  --  97* 95*  CO2 26   < > 22  --  25 24  --  25 24  GLUCOSE 144*   < > 217*  --  198* 124*  --  182* 177*  BUN 25*   < > 94*  --  63* 101*  --  59* 86*  CREATININE 4.71*   < > 7.89*  --  5.46* 7.04*  --  4.71* 6.09*  CALCIUM 8.8*   < > 8.6*  --  8.7* 9.0  --  8.4* 9.2  MG  --   --   --  2.3  --   --  2.8* 2.4  --   PHOS 5.6*  --   --   --   --   --   --  4.3  --    < > = values in this interval not displayed.    Liver Function Tests: Recent Labs  Lab 03/07/19 0326 03/12/19 0448  AST  --  28  ALT  --  23  ALKPHOS  --  71  BILITOT  --  0.2*  PROT  --  6.6  ALBUMIN 2.0* 1.8*   No results for input(s): LIPASE, AMYLASE in the last 168 hours. Recent Labs  Lab 03/12/19 0448  AMMONIA 20    CBC: Recent Labs  Lab 03/09/19 0442 03/10/19 0441 03/11/19 0402 03/12/19 0448 03/13/19 0344  WBC 16.9* 16.2* 16.8* 11.7* 13.4*  HGB 8.7* 8.4* 9.3* 8.0* 9.1*  HCT 27.8* 26.8* 30.2* 25.8* 29.2*  MCV 73.2* 72.6* 72.9* 73.3* 73.4*  PLT 318 297 356 314 399    Cardiac Enzymes: No results for input(s): CKTOTAL, CKMB, CKMBINDEX, TROPONINI in the last 168 hours.  BNP: Invalid input(s): POCBNP  CBG: Recent Labs  Lab 03/12/19 1113 03/12/19 1523 03/12/19 1930 03/13/19 0029 03/13/19 0435  GLUCAP 134* 272* 199* 174* 180*    Microbiology: Results for orders placed or performed during the hospital encounter of 02/18/2019  SARS Coronavirus 2 (CEPHEID - Performed in [place] hospital lab), Hosp Order     Status: None   Collection Time: 02/12/2019 12:56 PM   Specimen: Nasopharyngeal Swab  Result Value Ref Range Status   SARS Coronavirus 2 NEGATIVE NEGATIVE Final    Comment: (NOTE) If result is NEGATIVE SARS-CoV-2 target nucleic acids are NOT DETECTED. The SARS-CoV-2 RNA is  generally detectable in upper and lower  respiratory specimens during the acute phase of infection. The lowest  concentration of SARS-CoV-2 viral copies this assay can detect is 250  copies / mL. A negative result does not preclude SARS-CoV-2 infection  and should not be used as the sole basis for treatment or other  patient management decisions.  A negative result may occur with  improper specimen collection / handling, submission of specimen other  than nasopharyngeal swab, presence of viral mutation(s) within the  areas targeted by this assay, and inadequate number of viral  copies  (<250 copies / mL). A negative result must be combined with clinical  observations, patient history, and epidemiological information. If result is POSITIVE SARS-CoV-2 target nucleic acids are DETECTED. The SARS-CoV-2 RNA is generally detectable in upper and lower  respiratory specimens dur ing the acute phase of infection.  Positive  results are indicative of active infection with SARS-CoV-2.  Clinical  correlation with patient history and other diagnostic information is  necessary to determine patient infection status.  Positive results do  not rule out bacterial infection or co-infection with other viruses. If result is PRESUMPTIVE POSTIVE SARS-CoV-2 nucleic acids MAY BE PRESENT.   A presumptive positive result was obtained on the submitted specimen  and confirmed on repeat testing.  While 2019 novel coronavirus  (SARS-CoV-2) nucleic acids may be present in the submitted sample  additional confirmatory testing may be necessary for epidemiological  and / or clinical management purposes  to differentiate between  SARS-CoV-2 and other Sarbecovirus currently known to infect humans.  If clinically indicated additional testing with an alternate test  methodology [phone]) is advised. The SARS-CoV-2 RNA is generally  detectable in upper and lower respiratory sp ecimens during the acute  phase of  infection. The expected result is Negative. Fact Sheet for Patients:  StrictlyIdeas.no Fact Sheet for Healthcare Providers: BankingDealers.co.za This test is not yet approved or cleared by the [place] FDA and has been authorized for detection and/or diagnosis of SARS-CoV-2 by FDA under an Emergency Use Authorization (EUA).  This EUA will remain in effect (meaning this test can be used) for the duration of the COVID-19 declaration under Section 564(b)(1) of the Act, 21 U.S.C. section 360bbb-3(b)(1), unless the authorization is terminated or revoked sooner. Performed at [facility], [place] [address], [place], [place] [address]   Culture, blood (routine x 2)     Status: None   Collection Time: 03/05/19 10:58 PM   Specimen: BLOOD  Result Value Ref Range Status   Specimen Description BLOOD LEFT ANTECUBITAL  Final   Special Requests   Final    BOTTLES DRAWN AEROBIC ONLY Blood Culture adequate volume   Culture   Final    NO GROWTH 5 DAYS Performed at [facility], [place] [address], [place], [place] [address]    Report Status 03/11/2019 FINAL  Final  Culture, blood (Routine X 2) w Reflex to ID Panel     Status: None   Collection Time: 03/05/19 11:05 PM   Specimen: BLOOD  Result Value Ref Range Status   Specimen Description BLOOD SITE NOT SPECIFIED  Final   Special Requests   Final    BOTTLES DRAWN AEROBIC ONLY Blood Culture adequate volume   Culture   Final    NO GROWTH 5 DAYS Performed at [facility], [place] [address] [place], [place] [address]    Report Status 03/11/2019 FINAL  Final  MRSA PCR Screening     Status: None   Collection Time: 03/06/19  6:57 PM   Specimen: Nasal Mucosa; Nasopharyngeal  Result Value Ref Range Status   MRSA by PCR NEGATIVE NEGATIVE Final    Comment:        The GeneXpert MRSA Assay (FDA approved for NASAL specimens only), is one component of a comprehensive MRSA  colonization surveillance program. It is not intended to diagnose MRSA infection nor to guide or monitor treatment for MRSA infections. Performed at [facility], [place] [address], [place], [place] [address]   Culture, respiratory (  non-expectorated)     Status: None   Collection Time: 03/06/19  8:21 PM   Specimen: Tracheal Aspirate; Respiratory  Result Value Ref Range Status   Specimen Description TRACHEAL ASPIRATE  Final   Special Requests NONE  Final   Gram Stain NO WBC SEEN NO ORGANISMS SEEN   Final   Culture   Final    FEW Consistent with normal respiratory flora. Performed at [facility], [place] [address] [place], [place] [address]    Report Status 03/09/2019 FINAL  Final    Coagulation Studies: No results for input(s): LABPROT, INR in the last 72 hours.  Urinalysis: No results for input(s): COLORURINE, LABSPEC, PHURINE, GLUCOSEU, HGBUR, BILIRUBINUR, KETONESUR, PROTEINUR, UROBILINOGEN, NITRITE, LEUKOCYTESUR in the last 72 hours.  Invalid input(s): APPERANCEUR    Imaging: Dg Chest Port 1 View  Result Date: 03/13/2019 CLINICAL DATA:  Acute respiratory failure. EXAM: PORTABLE CHEST 1 VIEW COMPARISON:  Chest x-ray from yesterday. FINDINGS: Slight advancement of the endotracheal tube with the tip now 4.3 cm above the carina. Unchanged enteric tube. Unchanged right internal jugular central venous and tunneled dialysis catheters. Stable cardiomegaly. Slightly lower lung volumes today with stable diffuse interstitial coarsening. Unchanged mild streaky opacity at the right lung base. No pleural effusion or pneumothorax. No acute osseous abnormality. IMPRESSION: 1. Slight advancement of the endotracheal tube, in good position. Otherwise stable lines and tubes. 2. Slightly decreased lung volumes with unchanged diffuse interstitial coarsening and right basilar atelectasis. Electronically Signed   By: Titus Dubin M.D.   On: 03/13/2019 07:00   Dg Chest Port 1 View  Result  Date: 03/12/2019 CLINICAL DATA:  Acute on chronic respiratory failure EXAM: PORTABLE CHEST 1 VIEW COMPARISON:  Two days ago FINDINGS: Endotracheal tube tip at the clavicular heads. Right IJ line and dialysis catheter with tips at the right atrium. The orogastric tube tip and side-port reaches the stomach. Stable cardiomegaly. Stable interstitial coarsening with mild streaky density at the right base. No evidence of effusion or pneumothorax. IMPRESSION: 1. Higher endotracheal tube with tip at T2. 2. No significant change in aeration.  Stable cardiomegaly. Electronically Signed   By: Monte Fantasia M.D.   On: 03/12/2019 07:18     Medications:   . sodium chloride 10 mL/hr at 03/13/19 0600  . amiodarone 30 mg/hr (03/13/19 0600)  . heparin 1,050 Units/hr (03/13/19 0600)   . sodium chloride   Intravenous Once  . aspirin  81 mg Per Tube Daily  . calcitRIOL  0.25 mcg Oral Q M,W,F-HD  . chlorhexidine gluconate (MEDLINE KIT)  15 mL Mouth Rinse BID  . Chlorhexidine Gluconate Cloth  6 each Topical Q0600  . Chlorhexidine Gluconate Cloth  6 each Topical Q0600  . darbepoetin (ARANESP) injection - NON-DIALYSIS  100 mcg Subcutaneous Q Mon-1800  . feeding supplement (NEPRO CARB STEADY)  1,000 mL Per Tube Q24H  . feeding supplement (PRO-STAT SUGAR FREE 64)  30 mL Per Tube BID  . hydrALAZINE  50 mg Per Tube TID  . insulin aspart  0-15 Units Subcutaneous Q4H  . insulin glargine  10 Units Subcutaneous Daily  . isosorbide dinitrate  20 mg Per Tube BID  . mouth rinse  15 mL Mouth Rinse 10 times per day  . methylPREDNISolone (SOLU-MEDROL) injection  40 mg Intravenous Daily  . metoprolol tartrate  12.5 mg Per Tube BID  . nicotine  14 mg Transdermal Daily  . pantoprazole sodium  40 mg Per Tube Daily  . sevelamer carbonate  1.6 g  Per Tube TID WC  . sodium chloride flush  3 mL Intravenous Q12H   sodium chloride, acetaminophen (TYLENOL) oral liquid 160 mg/5 mL, calcium carbonate (dosed in mg elemental calcium),  camphor-menthol **AND** [DISCONTINUED] hydrOXYzine, diphenhydrAMINE-zinc acetate, docusate sodium, fentaNYL (SUBLIMAZE) injection, heparin, ipratropium-albuterol, metoprolol tartrate, ondansetron **OR** ondansetron (ZOFRAN) IV, polyethylene glycol, sorbitol  Assessment/ Plan:   ESRD-Tuesday Thursday Saturday dialysis patient she is currently off schedule.  She receives dialysis 03/11/2019 with no volume removed.  Next dialysis treatment will be scheduled 03/13/2019 she has a AV graft and hemodialysis catheter  Anemia status post transfusion x2 units 03/06/2019 last iron saturations 15%.  We will continue to follow.  Started darbepoetin 100 mcg weekly  Bones.  Continues on sevelamer binders.  Phosphorus reasonable at this point.  Calcium controlled.  She also continues on calcitriol 0.25 mcg Monday Wednesday Friday  Hypertension.  Continues on antihypertensive medications.  Next dialysis treatment 03/11/2019  Congestive heart failure systolic dysfunction EF 40 to 45%  Pneumonia-IV Zosyn has been discontinued 03/11/2019  Emphysema IV Solu-Medrol.  Diabetes mellitus as per primary team   Nutrition continues on Protostat twice daily and Nepro 1 L per 24 hours  History of hepatitis C  History of polysubstance abuse  Encephalopathy with a history of CVA in the past negative except for small vessel white matter disease CT scan of head.  MRI consistent with acute stroke  Paroxysmal atrial fibrillation initiated with amiodarone and metoprolol.  Still having runs of rapid ventricular rate  Disposition.  Looking at long-term care facility if continued aggressive therapy is decided upon by family   LOS: [place] _0 _1 :45 AM

## 2019-03-13 NOTE — Progress Notes (Signed)
Daily Progress Note   Patient Name: Rhonda Lynch       Date: 03/13/2019 DOB: [birth year]  Age: 63 y.o. MRN#: [id number] Attending Physician: Rush Farmer, MD Primary Care Physician: Tawny Asal Admit Date: 03/05/2019  Reason for Consultation/Follow-up: Establishing goals of care, Psychosocial/spiritual support and Terminal Care  Subjective: Spoke with sons Merrily Pew and Berneta Sages.  Both sons are overwhelmed with sadness.  Berneta Sages goes into the room with his mother.  Merrily Pew is unable to do so.  We talked privately.  Discussed vasculitis that likely made her whole body fragile, and the respiratory arrest in HD which appears to have caused strokes in both sides of her brain and brain stem.  The sons have already discussed this - they know their mother would not want to live bed bound and dependent.  They changed her code status to DNR and support withdrawing life support.  Merrily Pew is unable to stay (emotionally).  He and his mother were very close.  At times she lived with him.  Berneta Sages has chosen to stay. When asked when to move forward with removing life support both men suggested today.   They understand their mother could die quickly or could live a couple of days.   Assessment: Diffuse infarction in multiple areas of the brain bilaterally including the brain stem.  Patient unable to ween from vent due to mental status.  ESRD non-compliant with hemodialysis.  Cyptogenic organizing pneumonia.   Patient Profile/HPI: 63 y.o. female  with past medical history of ESRD on HD, ANCA vasculitis, previous stroke, Hep C, cocaine abuse on nocturnal oxygen at home who was admitted on 02/27/2019 with bilateral pneumonia and volume overload causing acute respiratory failure.  On 6/25 she refused to wear her  oxygen and was given ativan for anxiety.  She became hypoxic and a rapid response was called.  On 6/26 in hemodialysis she suffered respiratory arrest.  She was successfully resuscitated and has remained intubated since.  Most recent imaging shows multiple diffuse strokes on in both hemispheres and the brain stem.  She is unable to be extubated due to her mental status.   Of note, Ronasia has had 11 inpatient admissions in 6 months.   PMT was consulted for [place].   Length of Stay: 8  Current Medications: Scheduled Meds:  chlorhexidine gluconate (MEDLINE KIT)  15 mL Mouth Rinse BID   Chlorhexidine Gluconate Cloth  6 each Topical Q0600   Chlorhexidine Gluconate Cloth  6 each Topical Q0600   LORazepam  1 mg Intravenous Q6H   LORazepam  2 mg Intravenous Once    Continuous Infusions:  sodium chloride 10 mL/hr at 03/13/19 1600   HYDROmorphone      PRN Meds: sodium chloride, acetaminophen (TYLENOL) oral liquid 160 mg/5 mL, antiseptic oral rinse, camphor-menthol **AND** [DISCONTINUED] hydrOXYzine, diphenhydrAMINE-zinc acetate, fentaNYL (SUBLIMAZE) injection, glycopyrrolate **OR** glycopyrrolate **OR** glycopyrrolate, [DISCONTINUED] haloperidol **OR** [DISCONTINUED] haloperidol **OR** haloperidol lactate, HYDROmorphone, [DISCONTINUED] LORazepam **OR** [DISCONTINUED] LORazepam **OR** LORazepam, ondansetron **OR** ondansetron (ZOFRAN) IV, polyvinyl alcohol  Physical Exam        Petite female, in restraints in ICU bed.  She opens her eyes for me. CV rrr resp on ventilator Abdomen soft Extremities with swelling in all 4  Vital Signs: BP 138/67 (BP Location: Left Arm)    Pulse 73    Temp 98.2 F (36.8 C) (Axillary)    Resp (!) 21    Wt 56.1 kg    SpO2 99%    BMI 23.37 kg/m  SpO2: SpO2: 99 % O2 Device: O2 Device: Ventilator O2 Flow Rate: O2 Flow Rate (L/min): 2 L/min  Intake/output summary:   Intake/Output Summary (Last 24 hours) at 03/13/2019 1649 Last data filed at 03/13/2019 1600 Gross  per 24 hour  Intake 1547.59 ml  Output 700 ml  Net 847.59 ml   LBM: Last BM Date: 03/09/19 Baseline Weight: Weight: 56.5 kg Most recent weight: Weight: 56.1 kg       Palliative Assessment/Data: 10%      Patient Active Problem List   Diagnosis Date Noted   Cerebral embolism with cerebral infarction 03/12/2019   Acute on chronic respiratory failure (HCC)    Acute arterial ischemic stroke, multifocal, multiple vascular territories ([place])    Altered mental status, unspecified 03/06/2019   Volume overload 03/03/2019   History of CVA (cerebrovascular accident) 02/18/2019   Hypoxia    Acute metabolic encephalopathy    Acute on chronic systolic CHF (congestive heart failure) (HCC) 01/11/2019   Fluid overload 01/11/2019   Diarrhea 77/41/2878   Metabolic acidosis, increased anion gap (IAG) 12/19/2018   Volvulus ([place]) 12/19/2018   Hyperkalemia 11/25/2018   Cocaine overdose, accidental or unintentional, initial encounter ([place]) 11/25/2018   CAP (community acquired pneumonia) 11/17/2018   End-stage renal disease on hemodialysis ([place])    Hypoglycemia 10/23/2018   Non-intractable vomiting    Other ascites    Palliative care encounter    Left arm pain    Chronic hepatitis C virus genotype 1b infection ([place]) 09/28/2018   ICH (intracerebral hemorrhage) ([place]) small left caudate due to HTN 07/17/2018   HCAP (healthcare-associated pneumonia) 06/21/2018   Substance induced mood disorder ([place]) 05/15/2018   Agitation    Hemodialysis catheter infection, initial encounter ([place])    Fungemia 04/27/2018   Pulmonary embolism ([place]) 04/27/2018   Sepsis ([place]) 04/26/2018   Dyspnea 04/26/2018   Chest pain 04/26/2018   Tachycardia 04/26/2018   Abdominal pain 04/18/2018   Insulin dependent diabetes mellitus ([place]) 04/18/2018   Right flank pain 04/18/2018   Acute respiratory failure with hypoxia (HCC) 04/12/2018   Atrial fibrillation with rapid ventricular  response (HCC)    Chronic renal failure    Accelerated hypertension    Noncompliance    Acute pulmonary edema (HCC)    Elevated troponin 03/19/2018   Fever    Pulmonary  vasculitis ([place])    Diffuse pulmonary alveolar hemorrhage    Hypoxemia    Pulmonary infiltrate    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) 02/12/2018   Epigastric pain 02/12/2018   Pneumonia 02/12/2018   TOBACCO ABUSE 03/08/2010   PNEUMONIA 03/08/2010    Palliative Care Plan    Recommendations/Plan:  Shift to full comfort and withdraw of life support.  Ordered dilaudid gtt (due to ESRD), as well as PRN boluses  Ativan scheduled for comfort and PRN for agitation or seizure  Robinul PRN for excess secretions.  Son wants to remain at bedside.  Chaplain consulted.  PMT will round again tomorrow to ensure comfort and assist with symptom management.  Goals of Care and Additional Recommendations:  Limitations on Scope of Treatment: Full Comfort Care  Code Status:  DNR  Prognosis:   Hours - Days   Discharge Planning:  Anticipated Hospital Death  Care plan was discussed with PCCM, ICU RN, Family.  Thank you for allowing the Palliative Medicine Team to assist in the care of this patient.  Total time spent:  60 min.     Greater than 50%  of this time was spent counseling and coordinating care related to the above assessment and plan.  Florentina Jenny, PA-C Palliative Medicine  Please contact Palliative MedicineTeam phone at [phone] for questions and concerns between 7 am - 7 pm.   Please see AMION for individual provider pager numbers.     ]

## 2019-03-14 DIAGNOSIS — Z515 Encounter for palliative care: Secondary | ICD-10-CM

## 2019-03-14 MED ORDER — GLYCOPYRROLATE 0.2 MG/ML IJ SOLN
0.2000 mg | Freq: Four times a day (QID) | INTRAMUSCULAR | Status: DC
  Administered 2019-03-14 – 2019-03-15 (×7): 0.2 mg via INTRAVENOUS
  Filled 2019-03-14 (×7): qty 1

## 2019-03-14 MED ORDER — HYDROMORPHONE BOLUS VIA INFUSION
1.0000 mg | INTRAVENOUS | Status: DC | PRN
  Administered 2019-03-14: 1 mg via INTRAVENOUS
  Filled 2019-03-14: qty 2

## 2019-03-14 NOTE — Progress Notes (Signed)
Spoke w/ pts son Rhonda Lynch (unable to reach [place]) to ensure family aware they are allowed at bedside as pt is now comfort cares.  Explained that four family members are allowed in the hospital at a time and two at the bedside.  Pts son states understanding and that he will relay to brother Rhonda Lynch.

## 2019-03-14 NOTE — Progress Notes (Signed)
[facility] ROUNDING NOTE   Subjective:   This is a very pleasant 63 year old lady that was admitted with shortness of breath pulmonary consolidation intubated by ICU was started on empiric antibiotics and IV steroids.  She was diagnosed with hypoxic respiratory failure due to bilateral pneumonia.  She is a Tuesday Thursday Saturday dialysis patient that is currently off schedule and is receiving dialysis Monday, 03/09/2019.  She has a right forearm AV graft and a hemodialysis catheter.  She has a history of cocaine abuse, tobacco abuse and emphysema.  She also has a history of hepatitis C. she has had several runs of atrial fibrillation.  She continues to be combative.  The does not appear to be much in way of movement in her left upper and lower extremity.  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  There is no associated hemorrhage and mild local mass-effect.  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  EEG revealed no seizure activity.  Appreciate assistance from Dr. Erlinda Hong neurology and from Florentina Jenny for palliative medicine.  Appreciate assistance from palliative medicine.  Patient now extubated and comfort care only   Status post transfusion 2 units packed red blood cells 03/06/2019  Lorazepam 1 mg every 6 hours, morphine drip 1 mg/h continuous   IV amiodarone   Chest x-ray stable 03/10/2019 stable cardiomegaly slightly improved interstitial prominence 03/10/2019  2D echo 01/12/2019 EF 40 to 45% severely dilated left atrium and small pericardial effusion.  There is mild valvular thickening  MRI 03/11/2019 diffuse patchy subacute infarction of cerebellum right cerebral hemisphere left occipital lobe  right greater than left basal ganglia with punctate foci in the brainstem.   CT scan of chest 03/05/2019 small pericardial effusion new compared to previous study, dense peribronchial vascular [name] and consolidative airspace opacities bilaterally worsened since 02/03/2019 with underlying centrilobular emphysema small bilateral pleural effusions.  Abdominal x-ray OG tube projecting over the patient's gastric body  Objective:  Vital signs in last 24 hours:  Temp:  [97.6 F (36.4 C)-98.5 F (36.9 C)] 98.5 F (36.9 C) (07/04 0703) Pulse Rate:  [58-96] 65 (07/03 1700) Resp:  [1-21] 1 (07/03 1800) BP: (79-144)/(46-88) 138/67 (07/03 1600) SpO2:  [97 %-100 %] 99 % (07/03 1700) FiO2 (%):  [30 %] 30 % (07/03 1630)  Weight change:  Filed Weights   03/11/19 2039 03/12/19 0438 03/13/19 0500  Weight: 55.6 kg 55.5 kg 56.1 kg    Intake/Output: I/O last 3 completed shifts: In: 1510.3 [I.V.:950.3; NG/GT:560] Out: 700 [Other:700]   Intake/Output this shift:  No intake/output data recorded. Extubated to nasal cannula CVS-regular at present in sinus rhythm RS-scattered rhonchorous breath sounds ventilator ABD- BS present soft non-distended OG tube EXT- no edema right AV graft with thrill bruit right [facility]   Basic Metabolic Panel: Recent Labs  Lab 03/09/19 0442 03/09/19 2228 03/10/19 0441 03/11/19 0402 03/11/19 0420 03/12/19 0448 03/13/19 0344  NA 135  --  133* 133*  --  137 136  K 4.5 4.0 4.3 3.9  --  3.7 3.9  CL 93*  --  93* 91*  --  97* 95*  CO2 22  --  25 24  --  25 24  GLUCOSE 217*  --  198* 124*  --  182* 177*  BUN 94*  --  63* 101*  --  59* 86*  CREATININE 7.89*  --  5.46* 7.04*  --  4.71* 6.09*  CALCIUM 8.6*  --  8.7* 9.0  --  8.4* 9.2  MG  --  2.3  --   --  2.8* 2.4  --   PHOS  --   --   --   --   --  4.3  --     Liver Function Tests: Recent Labs  Lab 03/12/19 0448  AST 28  ALT 23  ALKPHOS 71  BILITOT 0.2*  PROT 6.6  ALBUMIN 1.8*   No results for  input(s): LIPASE, AMYLASE in the last 168 hours. Recent Labs  Lab 03/12/19 0448  AMMONIA 20    CBC: Recent Labs  Lab 03/09/19 0442 03/10/19 0441 03/11/19 0402 03/12/19 0448 03/13/19 0344  WBC 16.9* 16.2* 16.8* 11.7* 13.4*  HGB 8.7* 8.4* 9.3* 8.0* 9.1*  HCT 27.8* 26.8* 30.2* 25.8* 29.2*  MCV 73.2* 72.6* 72.9* 73.3* 73.4*  PLT 318 297 356 314 399    Cardiac Enzymes: No results for input(s): CKTOTAL, CKMB, CKMBINDEX, TROPONINI in the last 168 hours.  BNP: Invalid input(s): POCBNP  CBG: Recent Labs  Lab 03/13/19 0029 03/13/19 0435 03/13/19 0754 03/13/19 1103 03/13/19 1525  GLUCAP 174* 180* 131* 127* 102*    Microbiology: Results for orders placed or performed during the hospital encounter of 03/01/2019  SARS Coronavirus 2 (CEPHEID - Performed in [facility] hospital lab), Hosp Order     Status: None   Collection Time: 02/21/2019 12:56 PM   Specimen: Nasopharyngeal Swab  Result Value Ref Range Status   SARS Coronavirus 2 NEGATIVE NEGATIVE Final    Comment: (NOTE) If result is NEGATIVE SARS-CoV-2 target nucleic acids are NOT DETECTED. The SARS-CoV-2 RNA is generally detectable in upper and lower  respiratory specimens during the acute phase of infection. The lowest  concentration of SARS-CoV-2 viral copies this assay can detect is 250  copies / mL. A negative result does not preclude SARS-CoV-2 infection  and should not be used as the sole basis for treatment or other  patient management decisions.  A negative result may occur with  improper specimen collection / handling, submission of specimen other  than nasopharyngeal swab, presence of viral mutation(s) within the  areas targeted by this assay, and inadequate number of viral copies  (<250 copies / mL). A negative result must be combined with clinical  observations, patient history, and epidemiological information. If result is POSITIVE SARS-CoV-2 target nucleic acids are DETECTED. The SARS-CoV-2 RNA is  generally detectable in upper and lower  respiratory specimens dur ing the acute phase of infection.  Positive  results are indicative of active infection with SARS-CoV-2.  Clinical  correlation with patient history and other diagnostic information is  necessary to determine patient infection status.  Positive results do  not rule out bacterial infection or co-infection with other viruses. If result is PRESUMPTIVE POSTIVE SARS-CoV-2 nucleic acids MAY BE PRESENT.   A presumptive positive result was obtained on the submitted specimen  and confirmed on repeat testing.  While 2019 novel coronavirus  (SARS-CoV-2) nucleic acids may be present in the submitted sample  additional confirmatory testing may be necessary for epidemiological  and / or clinical management purposes  to differentiate between  SARS-CoV-2 and other Sarbecovirus currently known to infect humans.  If clinically indicated additional testing with an alternate test  methodology [phone]) is advised. The SARS-CoV-2 RNA  is generally  detectable in upper and lower respiratory sp ecimens during the acute  phase of infection. The expected result is Negative. Fact Sheet for Patients:  StrictlyIdeas.no Fact Sheet for Healthcare Providers: BankingDealers.co.za This test is not yet approved or cleared by the [place] FDA and has been authorized for detection and/or diagnosis of SARS-CoV-2 by FDA under an Emergency Use Authorization (EUA).  This EUA will remain in effect (meaning this test can be used) for the duration of the COVID-19 declaration under Section 564(b)(1) of the Act, 21 U.S.C. section 360bbb-3(b)(1), unless the authorization is terminated or revoked sooner. Performed at [facility], [place] [address] [place], [place] [address]   Culture, blood (routine x 2)     Status: None   Collection Time: 03/05/19 10:58 PM   Specimen: BLOOD  Result Value Ref Range Status    Specimen Description BLOOD LEFT ANTECUBITAL  Final   Special Requests   Final    BOTTLES DRAWN AEROBIC ONLY Blood Culture adequate volume   Culture   Final    NO GROWTH 5 DAYS Performed at [facility], [place] [address] [place], [place] [address]    Report Status 03/11/2019 FINAL  Final  Culture, blood (Routine X 2) w Reflex to ID Panel     Status: None   Collection Time: 03/05/19 11:05 PM   Specimen: BLOOD  Result Value Ref Range Status   Specimen Description BLOOD SITE NOT SPECIFIED  Final   Special Requests   Final    BOTTLES DRAWN AEROBIC ONLY Blood Culture adequate volume   Culture   Final    NO GROWTH 5 DAYS Performed at [facility], [place] [address] [place], [place] [address]    Report Status 03/11/2019 FINAL  Final  MRSA PCR Screening     Status: None   Collection Time: 03/06/19  6:57 PM   Specimen: Nasal Mucosa; Nasopharyngeal  Result Value Ref Range Status   MRSA by PCR NEGATIVE NEGATIVE Final    Comment:        The GeneXpert MRSA Assay (FDA approved for NASAL specimens only), is one component of a comprehensive MRSA colonization surveillance program. It is not intended to diagnose MRSA infection nor to guide or monitor treatment for MRSA infections. Performed at [facility], [place] [address], [place], [place] [address]   Culture, respiratory (non-expectorated)     Status: None   Collection Time: 03/06/19  8:21 PM   Specimen: Tracheal Aspirate; Respiratory  Result Value Ref Range Status   Specimen Description TRACHEAL ASPIRATE  Final   Special Requests NONE  Final   Gram Stain NO WBC SEEN NO ORGANISMS SEEN   Final   Culture   Final    FEW Consistent with normal respiratory flora. Performed at [facility], [place] [address], [place], [place] [address]    Report Status 03/09/2019 FINAL  Final    Coagulation Studies: No results for input(s): LABPROT, INR in the last 72 hours.  Urinalysis: No results for input(s): COLORURINE,  LABSPEC, PHURINE, GLUCOSEU, HGBUR, BILIRUBINUR, KETONESUR, PROTEINUR, UROBILINOGEN, NITRITE, LEUKOCYTESUR in the last 72 hours.  Invalid input(s): APPERANCEUR    Imaging: Dg Chest Port 1 View  Result Date: 03/13/2019 CLINICAL DATA:  Acute respiratory failure. EXAM: PORTABLE CHEST 1 VIEW COMPARISON:  Chest x-ray from yesterday. FINDINGS: Slight advancement of the endotracheal tube with the tip now 4.3 cm above the carina. Unchanged enteric tube. Unchanged right internal jugular central venous  and tunneled dialysis catheters. Stable cardiomegaly. Slightly lower lung volumes today with stable diffuse interstitial coarsening. Unchanged mild streaky opacity at the right lung base. No pleural effusion or pneumothorax. No acute osseous abnormality. IMPRESSION: 1. Slight advancement of the endotracheal tube, in good position. Otherwise stable lines and tubes. 2. Slightly decreased lung volumes with unchanged diffuse interstitial coarsening and right basilar atelectasis. Electronically Signed   By: Titus Dubin M.D.   On: 03/13/2019 07:00     Medications:   . sodium chloride 10 mL/hr at 03/14/19 0600  . HYDROmorphone 1 mg/hr (03/14/19 0600)   . chlorhexidine gluconate (MEDLINE KIT)  15 mL Mouth Rinse BID  . Chlorhexidine Gluconate Cloth  6 each Topical Q0600  . Chlorhexidine Gluconate Cloth  6 each Topical Q0600  . LORazepam  1 mg Intravenous Q6H   sodium chloride, acetaminophen (TYLENOL) oral liquid 160 mg/5 mL, antiseptic oral rinse, camphor-menthol **AND** [DISCONTINUED] hydrOXYzine, diphenhydrAMINE-zinc acetate, fentaNYL (SUBLIMAZE) injection, glycopyrrolate **OR** glycopyrrolate **OR** glycopyrrolate, [DISCONTINUED] haloperidol **OR** [DISCONTINUED] haloperidol **OR** haloperidol lactate, HYDROmorphone, [DISCONTINUED] LORazepam **OR** [DISCONTINUED] LORazepam **OR** LORazepam, ondansetron **OR** ondansetron (ZOFRAN) IV, polyvinyl alcohol  Assessment/ Plan:   ESRD-Tuesday Thursday Saturday  dialysis patient she is currently off schedule.  She receives dialysis 03/11/2019 with no volume removed.  Patient underwent dialysis treatment 03/13/2019 with 700 cc removed.  She is now extubated on terminal wean  Anemia status post transfusion x2 units 03/06/2019 last iron saturations 15%.   Bones.  Continues on sevelamer binders.  Phosphorus reasonable at this point.  Calcium controlled.  Binders and vitamin D discontinued  Hypertension.  Continues on antihypertensive medications.    Congestive heart failure systolic dysfunction EF 40 to 45%  Pneumonia-IV Zosyn has been discontinued 03/11/2019  Emphysema IV Solu-Medrol.  Diabetes mellitus as per primary team   Nutrition continues on Protostat twice daily and Nepro 1 L per 24 hours  History of hepatitis C  History of polysubstance abuse  Encephalopathy with a history of CVA in the past negative except for small vessel white matter disease CT scan of head.  MRI consistent with acute stroke  Paroxysmal atrial fibrillation  Disposition.  Appreciate assistance from palliative medicine patient now on comfort care.  Will sign off thank you for consult  LOS: [place] '@TODAY' '@7' :07 AM

## 2019-03-14 NOTE — Progress Notes (Signed)
Report given to receiving RN on 6N. Pt to be transported by bed.

## 2019-03-14 NOTE — Progress Notes (Signed)
Spoke w/ pts son Berneta Sages to provide updates.  Relayed that pt has orders to transfer to 6N, reviewed visiting policies for pts on comfort care.  Berneta Sages states understanding, appreciative.

## 2019-03-14 NOTE — Progress Notes (Signed)
Patient extubated.  No events overnight.  Appears comfortable with relatively stable vital signs.  Will transfer to palliative care floor and to [facility] service with PCCM off 7/5.  Rush Farmer, M.D. [facility] Pulmonary/Critical Care Medicine. Pager: [phone]. After hours pager: [phone].

## 2019-03-14 NOTE — Progress Notes (Signed)
Spoke w/ pts son Rhonda Lynch to relay pts new room number and unit. Attempted to reach pts son Rhonda Lynch, however unable to get through.

## 2019-03-14 NOTE — Progress Notes (Signed)
   Palliative medicine progress note  Patient seen, chart reviewed.  Patient made comfort care last night.  She is resting comfortably.  No nonverbal signs and symptoms of pain noted such as grimacing or moaning.  Upper airway secretions are present.  She is required 3 PRN boluses to Dilaudid continuous infusion  Patient being transferred to 6 N. for comfort care.  Patient Profile/HPI: 63 y.o.femalewith past medical history of ESRD on HD, ANCA vasculitis, previous stroke, Hep C, cocaine abuse on nocturnal oxygen at [facility] was admitted on 6/24/2020with bilateral pneumonia and volume overload causing acute respiratory failure. On 6/25 she refused to wear her oxygen and was given ativan for anxiety. She became hypoxic and a rapid response was called. On 6/26 in hemodialysis she suffered respiratory arrest. She was successfully resuscitated and has remained intubated since. Most recent imaging shows multiple diffuse strokes on in both hemispheres and the brain stem. She is unable to be extubated due to her mental status.   Of note, Rhonda Lynch has had 11 inpatient admissions in 6 months. PMT was consulted for Rhonda Lynch.  On 03/13/2019, one-way extubation performed for comfort and in keeping with patient's wishes.  She is now comfort care  Plan Patient is now comfort care Too unstable to transport out of the hospital.  Anticipate hospital death Chart reviewed in terms of medication usages.  Will uptitrate Dilaudid continuous infusion to 2 mg an hour and continue with 1 to 2 mg every 15 minutes as needed for management of pain or shortness of breath We will start scheduled Robinul 0.2 mg every 6 hours and continue with PRN for breakthrough  management of secretions No agitation.  Continue with scheduled lorazepam  Prognosis Hours to days  Disposition At this point I anticipate a hospital death  Thank you, Rhonda Curls, NP Total time: 25 minutes Greater than 50% of time was spent in  counseling and coordination of care

## 2019-03-14 NOTE — Progress Notes (Signed)
   03/14/19 0100  Clinical Encounter Type  Visited With Patient and family together;Health care provider  Visit Type Initial;Spiritual support;Psychological support;Patient actively dying  Referral From Palliative care team  Spiritual Encounters  Spiritual Needs Emotional;Grief support;Prayer  Stress Factors  Family Stress Factors Major life changes;Loss of control;Family relationships   Responded to consults from PMT for terminal extubation.  Present at bedside w/ Latanya Presser from PMT and pt's younger son, Berneta Sages.  Compassionate presence, empathetic listening to challenges in the relationships btn mom and sons d/t mom's choices and demeanor.  Prayed at pt's bedside w/ Latanya Presser and Berneta Sages.  Went to waiting area to meet pt's older son, Vonna Kotyk, and his beloved.  Talked w/ them w/ Maryanne and alone, supported grief.  Present w/ Berneta Sages and PMT, RN and RT, when pt extubated.  Joshua's partner joined at bedside.    Provided printout of area funeral homes to Joshua's fiance.    Pls pg if desire chaplain to return.  Myra Gianotti resident, [phone]

## 2019-03-15 DIAGNOSIS — F1410 Cocaine abuse, uncomplicated: Secondary | ICD-10-CM

## 2019-03-15 DIAGNOSIS — E8779 Other fluid overload: Secondary | ICD-10-CM

## 2019-03-15 NOTE — Progress Notes (Signed)
PROGRESS NOTE    Rhonda Lynch  TWS:[id number] DOB: 04/13/1956 DOA: 02/18/2019 PCP: Clent Demark, PA-C   Brief Narrative: Per HPI/chart review: 63 year old female in very poor health has had multiple admissions, about 11 in last 6 months with history of ESRD on HD, ANCA vasculitis, previous history of, hep C, chronic cocaine abuse, on nocturnal oxygen at home, tobacco abus, history of ICS November/2019, who was admitted on 6/24 with bilateral pneumonia, volume overload and acute respiratory failure.  On 6/25 she refused to wear her oxygen, patient also received Ativan for anxiety, rapid response was called as she became hypoxic.  On 6/26 in hemodialysis she suffered respiratory arrest successfully resuscitated and remained intubated.  Her CT scan/MRI scan showed multiple diffuse destruction both hemispheres in the brainstem.  She was seen by neurology, nephrology, palliative care, PCCM.  Patient had long-term EEG and a spot EEG did not show seizure but encephalopathic state.  Patient is unable to be extubated given her mental status-and after palliative care consultation and family discussion she was terminally extubated for comfort measures.   Significant events: 03/05/2019 hypoxic event 6/26 respiratory arrest on dialysis, did not lose pulse 7/1 MRI reveals new evidence of CVA 7/2 Palliative Care consulted  7/3-terminally extubated  7/4-TX TO MED SURG FOR COMFORT CARE  Subjective: Patient obtunded, appears calm comfortable, not in acute distress.    Assessment & Plan: Acute hypoxic respiratory failure with respiratory arrest 6/26-needing intubation, with bilateral pneumonia/emphysema/fluid overload: Terminally extubated 7/3  Bilateral  Stroke: anterior and posterior circulation patchy infarcts embolic secondary to atrial fibrillation/atrial flutter, not on anticoagulation, was seen by neurology.  2D echo EF more than 65% moderate to severe TR, hemoglobin A1c 5.0 Acute encephalopathy  no seizure and EEG, has bilateral strokes and imaging. Hx of ICH ?seizure ESRD on HD  ??  Endocarditis with TV vegetation. Current blood culture negative Chronic cocaine abuse/tobacco abuse  Multiple hospitalizations A. Fib/flutter with RVR, new onset with intermittent bradycardia. Was on IV heparin and amiodarone during this hospitalization. Hypertension/hyperlipidemia Type 2 diabetes mellitus  Continue on current comfort measures as per palliative care.  Code Status: DNR-comfort  Care Family Communication: SON INFROMED BY PALLIATIVE/RN Disposition Plan: remains inpatient for comfort measures, and is felt to be unstable for transfer to hospice house   Consultants:  Neurology  Palliative care  PCCM Nephrology  Procedures: Significant Diagnostic Tests:  03/05/2019 CT scan again shows bilateral airspace disease and cardiomegaly 6/26 head CT neg  EEG 6/29 >> slow triphasic waves , no seizure Chest x-ray 6/30  improved bilateral infiltrates MRI brain 6/30> diffuse patchy acute/subacute infarct of cerebellum, R cerebral hemisphere, L occipital lobe, R>L basal ganglia. Punctate foci in brainstem and throughout L cerebral hemisphere. No hemorrhage. Local mass effect. Diffuse involvement suggests hypoxic ischemic injury vs embolic phenomenon vs vasculitis per radiology read.   CXR 7/2> stable cardiomegaly, interstitial coarsening. No effusion, no pneumothorax. ETT terminates at clavicular heads ECHO 7/2> LVEF >65%, RV mildly reduced systolic function. Moderate-severe tricuspid regurgitation. Dilated IVC.  CXR 7/3> ETT advanced in good position, slight decrease in lung volumes  MRA Head non-con >>>  Micro Data:  Tracheal aspirate 6/29> no organisms seen  resp cx 6/26 > neg BC 6/26 >>neg  SARS Cov 2 6/24> neg    Antimicrobials: Anti-infectives (From admission, onward)   Start     Dose/Rate Route Frequency Ordered Stop   03/06/19 0800  piperacillin-tazobactam (ZOSYN) IVPB 3.375 g      3.375 g 12.5 mL/hr  over 240 Minutes Intravenous Every 12 hours 03/05/19 2311 03/11/19 0736   03/05/19 2315  piperacillin-tazobactam (ZOSYN) IVPB 3.375 g     3.375 g 100 mL/hr over 30 Minutes Intravenous  Once 03/05/19 2311 03/06/19 0008       Objective: Vitals:   03/14/19 1200 03/14/19 1255 03/14/19 1357 03/15/19 0515  BP:  (!) 103/55 (!) 97/56 (!) 79/44  Pulse: 68 (!) 156 (!) 105 79  Resp: 19 (!) 21 (!) 21 10  Temp:    99.5 F (37.5 C)  TempSrc:    Oral  SpO2: (!) 77% (!) 78% (!) 81% (!) 70%  Weight:        Intake/Output Summary (Last 24 hours) at 03/15/2019 0944 Last data filed at 03/15/2019 0600 Gross per 24 hour  Intake 291.82 ml  Output -  Net 291.82 ml   Filed Weights   03/11/19 2039 03/12/19 0438 03/13/19 0500  Weight: 55.6 kg 55.5 kg 56.1 kg   Weight change:   Body mass index is 23.37 kg/m.  Intake/Output from previous day: 07/04 0701 - 07/05 0700 In: 319.8 [I.V.:319.8] Out: -  Intake/Output this shift: No intake/output data recorded.  Examination:  General exam: Obtunded, unresponsive breathing spontaneously  HEENT:Oral mucosa DRY Respiratory system: Bilateral breath sounds,  Cardiovascular system: S1 & S2 heard Gastrointestinal system: Abdomen is  soft Nervous System: OBTUNDED Extremities: No edema Skin: No rashes, lesions, no icterus  Medications:  Scheduled Meds: . chlorhexidine gluconate (MEDLINE KIT)  15 mL Mouth Rinse BID  . Chlorhexidine Gluconate Cloth  6 each Topical Q0600  . Chlorhexidine Gluconate Cloth  6 each Topical Q0600  . glycopyrrolate  0.2 mg Intravenous Q6H  . LORazepam  1 mg Intravenous Q6H   Continuous Infusions: . sodium chloride 250 mL (03/15/19 0559)  . HYDROmorphone 2 mg/hr (03/14/19 1510)    Data Reviewed: I have personally reviewed following labs and imaging studies  CBC: Recent Labs  Lab 03/09/19 0442 03/10/19 0441 03/11/19 0402 03/12/19 0448 03/13/19 0344  WBC 16.9* 16.2* 16.8* 11.7* 13.4*  HGB 8.7*  8.4* 9.3* 8.0* 9.1*  HCT 27.8* 26.8* 30.2* 25.8* 29.2*  MCV 73.2* 72.6* 72.9* 73.3* 73.4*  PLT 318 297 356 314 161   Basic Metabolic Panel: Recent Labs  Lab 03/09/19 0442 03/09/19 2228 03/10/19 0441 03/11/19 0402 03/11/19 0420 03/12/19 0448 03/13/19 0344  NA 135  --  133* 133*  --  137 136  K 4.5 4.0 4.3 3.9  --  3.7 3.9  CL 93*  --  93* 91*  --  97* 95*  CO2 22  --  25 24  --  25 24  GLUCOSE 217*  --  198* 124*  --  182* 177*  BUN 94*  --  63* 101*  --  59* 86*  CREATININE 7.89*  --  5.46* 7.04*  --  4.71* 6.09*  CALCIUM 8.6*  --  8.7* 9.0  --  8.4* 9.2  MG  --  2.3  --   --  2.8* 2.4  --   PHOS  --   --   --   --   --  4.3  --    GFR: Estimated Creatinine Clearance: 6.9 mL/min (A) (by C-G formula based on SCr of 6.09 mg/dL (H)). Liver Function Tests: Recent Labs  Lab 03/12/19 0448  AST 28  ALT 23  ALKPHOS 71  BILITOT 0.2*  PROT 6.6  ALBUMIN 1.8*   No results for input(s): LIPASE, AMYLASE in the last 168  hours. Recent Labs  Lab 03/12/19 0448  AMMONIA 20   Coagulation Profile: No results for input(s): INR, PROTIME in the last 168 hours. Cardiac Enzymes: No results for input(s): CKTOTAL, CKMB, CKMBINDEX, TROPONINI in the last 168 hours. BNP (last 3 results) No results for input(s): PROBNP in the last 8760 hours. HbA1C: No results for input(s): HGBA1C in the last 72 hours. CBG: Recent Labs  Lab 03/13/19 0029 03/13/19 0435 03/13/19 0754 03/13/19 1103 03/13/19 1525  GLUCAP 174* 180* 131* 127* 102*   Lipid Profile: Recent Labs    03/13/19 0344  CHOL 189  HDL 80  LDLCALC 88  TRIG 104  CHOLHDL 2.4   Thyroid Function Tests: No results for input(s): TSH, T4TOTAL, FREET4, T3FREE, THYROIDAB in the last 72 hours. Anemia Panel: No results for input(s): VITAMINB12, FOLATE, FERRITIN, TIBC, IRON, RETICCTPCT in the last 72 hours. Sepsis Labs: Recent Labs  Lab 03/09/19 0442 03/10/19 0441  PROCALCITON 2.30 1.99    Recent Results (from the past 240  hour(s))  Culture, blood (routine x 2)     Status: None   Collection Time: 03/05/19 10:58 PM   Specimen: BLOOD  Result Value Ref Range Status   Specimen Description BLOOD LEFT ANTECUBITAL  Final   Special Requests   Final    BOTTLES DRAWN AEROBIC ONLY Blood Culture adequate volume   Culture   Final    NO GROWTH 5 DAYS Performed at [facility], [address] [place], [place] [address]    Report Status 03/11/2019 FINAL  Final  Culture, blood (Routine X 2) w Reflex to ID Panel     Status: None   Collection Time: 03/05/19 11:05 PM   Specimen: BLOOD  Result Value Ref Range Status   Specimen Description BLOOD SITE NOT SPECIFIED  Final   Special Requests   Final    BOTTLES DRAWN AEROBIC ONLY Blood Culture adequate volume   Culture   Final    NO GROWTH 5 DAYS Performed at [facility], [place] [address] [place], [place] [address]    Report Status 03/11/2019 FINAL  Final  MRSA PCR Screening     Status: None   Collection Time: 03/06/19  6:57 PM   Specimen: Nasal Mucosa; Nasopharyngeal  Result Value Ref Range Status   MRSA by PCR NEGATIVE NEGATIVE Final    Comment:        The GeneXpert MRSA Assay (FDA approved for NASAL specimens only), is one component of a comprehensive MRSA colonization surveillance program. It is not intended to diagnose MRSA infection nor to guide or monitor treatment for MRSA infections. Performed at [facility], [place] [address], [place], [place] [address]   Culture, respiratory (non-expectorated)     Status: None   Collection Time: 03/06/19  8:21 PM   Specimen: Tracheal Aspirate; Respiratory  Result Value Ref Range Status   Specimen Description TRACHEAL ASPIRATE  Final   Special Requests NONE  Final   Gram Stain NO WBC SEEN NO ORGANISMS SEEN   Final   Culture   Final    FEW Consistent with normal respiratory flora. Performed at [facility], [place] [address], [place], [place] [address]    Report Status 03/09/2019 FINAL  Final       Radiology Studies: No results found.    LOS: 10 days   Time spent: More than 50% of that time was spent in counseling and/or coordination of care.  Antonieta Pert, MD [facility]  03/15/2019,  9:44 AM

## 2019-03-15 NOTE — Progress Notes (Signed)
Called Son for update but no answer, just left a message to call the unit .

## 2019-04-11 NOTE — Progress Notes (Signed)
Just spoke with Rhonda Lynch and informed this RN that he will call his brother Rhonda Lynch about their plans of their mother;s body but reiterated that somebody will be coming over to [facility] to see the body.  Left phone number [phone] for their call back.  Will endorse to day shift RN appropriately.

## 2019-04-11 NOTE — Progress Notes (Signed)
Nutrition Brief Note  Chart reviewed. Pt now transitioning to comfort care.  No further nutrition interventions warranted at this time.  Please re-consult as needed.   Cashmere Harmes A. Adley Mazurowski, RD, LDN, CDCES Registered Dietitian II Certified Diabetes Care and Education Specialist Pager: [phone] After hours Pager: [phone]  

## 2019-04-11 NOTE — Progress Notes (Signed)
Patient expired at 36 and confirmed by Rhonda Roes, RN, wasted about 44 mL of Dilaudid in stericycle, patient's sons Rhonda Lynch and Rhonda Lynch were called but no answer, left voicemail to call back at [phone], on-call [facility] was informed and prepared pt's death certificate, [facility] likewise informed and provided referral no. [id number], not a donor candidate.  Awaiting a return call from patient's sons for further action.

## 2019-04-11 NOTE — Progress Notes (Signed)
   2019-03-27 1000  Clinical Encounter Type  Visited With Health care provider  Visit Type Follow-up;Death   Went to unit to attempt to f/u w/ family d/t pt death.  Per RN Joaquim Lai, family is not coming after all.  Myra Gianotti resident, [phone]

## 2019-04-11 NOTE — Discharge Summary (Signed)
Physician Discharge Summary  Rhonda Lynch DHR:[id number] DOB: 1955/09/25 DOA: 02/16/2019  PCP: Clent Demark, PA-C  Admit date: 02/09/2019 Discharge date: 03/17/2019  Admitted From: home Disposition:  EXPIRED  Recommendations for Outpatient Follow-up:  NONE  Brief/Interim Summary: Please refer to previous notes consult notes admission notes for more detail hospital course.  63 year old female in very poor health has had multiple admissions, about 11 in last 6 months with history of ESRD on HD, ANCA vasculitis, previous history of, hep C, chronic cocaine abuse, on nocturnal oxygen at home, tobacco abus, history of ICS November/2019, who was admitted on 6/24 with bilateral pneumonia, volume overload and acute respiratory failure.  On 6/25 she refused to wear her oxygen, patient also received Ativan for anxiety, rapid response was called as she became hypoxic.  On 6/26 in hemodialysis she suffered respiratory arrest successfully resuscitated and remained intubated.  Her CT scan/MRI scan showed multiple diffuse destruction both hemispheres in the brainstem.  She was seen by neurology, nephrology, palliative care, PCCM.  Patient had long-term EEG and a spot EEG did not show seizure but encephalopathic state.  Patient is unable to be extubated given her mental status-and after palliative care consultation and family discussion she was terminally extubated for comfort measures.   Significant events: 03/05/2019 hypoxic event 6/26 respiratory arrest on dialysis, did not lose pulse 7/1 MRI reveals new evidence of CVA 7/2 Palliative Care consulted 7/3-terminally extubated  7/4-TX TO MED SURG FOR COMFORT CARE Patient peacefully expired under comfort measures  RN DEATH NOTE: "Patient expired at 76 and confirmed by Laural Roes, RN,    Assessment & Plan/issues addressed during hospital stay  Acute hypoxic respiratory failure with respiratory arrest 6/26-needing intubation, with bilateral  pneumonia/emphysema/fluid overload: Terminally extubated 7/3  Bilateral  Stroke: anterior and posterior circulation patchy infarcts embolic secondary to atrial fibrillation/atrial flutter, not on anticoagulation, was seen by neurology.  2D echo EF more than 65% moderate to severe TR, hemoglobin A1c 5.0 Acute encephalopathy no seizure and EEG, has bilateral strokes and imaging. Hx of ICH ?seizure ESRD on HD: Dialysis stopped due to comfort measures ??  Endocarditis with TV vegetation. Current blood culture negative Chronic cocaine abuse/tobacco abuse  Multiple hospitalizations A. Fib/flutter with RVR, new onset with intermittent bradycardia. Was on IV heparin and amiodarone during this hospitalization. Hypertension/hyperlipidemia Type 2 diabetes mellitus  PATENT PEACEFULLY EXPIRED UNDER HOSPICE/COMFORT MEASURE  Consultants:  Neurology  Palliative care  PCCM Nephrology  Procedures: Significant Diagnostic Tests:  03/05/2019 CT scan again shows bilateral airspace disease and cardiomegaly 6/26 head CT neg  EEG 6/29 >> slow triphasic waves , no seizure Chest x-ray 6/30 improved bilateral infiltrates MRI brain 6/30>diffuse patchy acute/subacute infarct of cerebellum, R cerebral hemisphere, L occipital lobe, R>L basal ganglia. Punctate foci in brainstem and throughout L cerebral hemisphere. No hemorrhage. Local mass effect. Diffuse involvement suggests hypoxic ischemic injury vs embolic phenomenon vs vasculitis per radiology read.  CXR 7/2> stable cardiomegaly, interstitial coarsening. No effusion, no pneumothorax. ETT terminates at clavicular heads ECHO 7/2>LVEF >65%, RV mildly reduced systolic function. Moderate-severe tricuspid regurgitation. Dilated IVC.  CXR 7/3>ETT advanced in good position, slight decrease in lung volumes  MRA Head non-con >>>  Micro Data:  Tracheal aspirate 6/29> no organisms seen resp cx 6/26 > neg [facility] 6/26 >>neg SARS Cov 2 6/24>  neg   Antimicrobials:            Anti-infectives (From admission, onward)       Discharge Diagnoses:  Principal Problem:  Volume overload Active Problems:   TOBACCO ABUSE   Endotracheally intubated   Cocaine abuse (HCC)   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[place])   Acute pulmonary edema (HCC)   Accelerated hypertension   Atrial fibrillation with rapid ventricular response (HCC)   Insulin dependent diabetes mellitus ([place])   Terminal care   End-stage renal disease on hemodialysis (HCC)   Fluid overload   Altered mental status, unspecified   Cerebral embolism with cerebral infarction   Acute on chronic respiratory failure (HCC)   Acute arterial ischemic stroke, multifocal, multiple vascular territories ([place])   Comfort measures only status   Palliative care by specialist    Discharge Instructions NONE  Allergies  Allergen Reactions  . Acetaminophen Nausea And Vomiting    Patient states Acetaminophen and Acetaminophen containing products make her nauseated. She demonstrated this 06/21/18 with nausea followed by emesis.  Marland Kitchen Hydroxyzine Itching  Procedures/Studies: Dg Chest 2 View  Result Date: 03/05/2019 CLINICAL DATA:  Shortness of breath.  End-stage renal disease EXAM: CHEST - 2 VIEW COMPARISON:  March 04, 2019 FINDINGS: There is extensive airspace consolidation throughout the right lung, increased compared to 1 day prior. There is more patchy infiltrate in the left upper and lower lobes, similar to 1 day prior. There is a small left pleural effusion. There is cardiomegaly with pulmonary vascularity within normal limits. Central catheter tip is in the right atrium. No pneumothorax. There is aortic atherosclerosis. No evident adenopathy. No bone lesions. IMPRESSION: Widespread airspace consolidation which has increased throughout the right lung compared to 1 day prior. Stable airspace opacity on the left. Suspect multifocal pneumonia, potentially  superimposed on a degree of pulmonary edema. There is stable cardiomegaly. There is a small left pleural effusion. Central catheter tip in right atrium. No pneumothorax. Aortic Atherosclerosis (ICD10-I70.0). Electronically Signed   By: Lowella Grip III M.D.   On: 03/05/2019 10:33   Ct Head Wo Contrast  Result Date: 03/06/2019 CLINICAL DATA:  Altered mental status EXAM: CT HEAD WITHOUT CONTRAST TECHNIQUE: Contiguous axial images were obtained from the base of the skull through the vertex without intravenous contrast. COMPARISON:  None. FINDINGS: Brain: No evidence of acute infarction, hemorrhage, hydrocephalus, extra-axial collection or mass lesion/mass effect. Periventricular white matter hypodensity. Vascular: No hyperdense vessel or unexpected calcification. Skull: Normal. Negative for fracture or focal lesion. Sinuses/Orbits: Fluid within the nasal cavity and sinuses, nonspecific in the setting of intubation. Other: None. IMPRESSION: No acute intracranial pathology.  Small-vessel white matter disease. Electronically Signed   By: Eddie Candle M.D.   On: 03/06/2019 21:19   Ct Chest Wo Contrast  Result Date: 03/05/2019 CLINICAL DATA:  Acute respiratory failure, pneumonia EXAM: CT CHEST WITHOUT CONTRAST TECHNIQUE: Multidetector CT imaging of the chest was performed following the standard protocol without IV contrast. COMPARISON:  Same-day chest radiographs, CT chest, 02/03/2019 FINDINGS: Cardiovascular: Gross cardiomegaly. Small pericardial effusion, new compared to prior examination. Large bore right chest multi lumen vascular catheter. Mediastinum/Nodes: No enlarged mediastinal, hilar, or axillary lymph nodes. Thyroid gland, trachea, and esophagus demonstrate no significant findings. Lungs/Pleura: There is dense, primarily peribronchovascular heterogeneous and consolidative airspace opacity bilaterally, worst in the right upper lobe and worsened in comparison to prior CT dated 02/03/2019. There is  underlying centrilobular emphysema. Small bilateral pleural effusions. Upper Abdomen: No acute abnormality. Musculoskeletal: No chest wall mass or suspicious bone lesions identified. IMPRESSION: 1. There is dense, primarily peribronchovascular heterogeneous and consolidative airspace opacity bilaterally, worst in the right upper lobe and worsened in comparison  to prior CT dated 02/03/2019. There is underlying centrilobular emphysema. Small bilateral pleural effusions. Findings are most consistent with multifocal infection although severe edema or ARDS could have this appearance. 2. Small pericardial effusion, new compared to prior examination. Gross cardiomegaly. Electronically Signed   By: Eddie Candle M.D.   On: 03/05/2019 14:42   Mr Brain Wo Contrast  Result Date: 03/11/2019 CLINICAL DATA:  63 y/o  F; left hemiplegia. EXAM: MRI HEAD WITHOUT CONTRAST TECHNIQUE: Multiplanar, multiecho pulse sequences of the brain and surrounding structures were obtained without intravenous contrast. COMPARISON:  03/06/2019 CT head.  07/18/2018 MRI head. FINDINGS: Brain: Diffuse patchy areas of reduced diffusion throughout the bilateral cerebellar hemispheres, right cerebral hemisphere and MCA and PCA distributions. There are punctate foci of reduced diffusion within the left midbrain and right posterior pons. Moderate size foci of reduced diffusion are present in the right basal ganglia concentrated and caudate nucleus and thalamus. Several punctate foci are scattered throughout the left frontal and parietal lobes and there is more confluent reduced diffusion in the left occipital lobe. Small foci are present within the left caudate head and thalamus. Findings are compatible with acute/early subacute infarction. Areas of infarction are T2 hyperintense and demonstrate mild local mass effect. No new susceptibility hypointensity to indicate intracranial hemorrhage. No extra-axial collection, hydrocephalus, or herniation. Vascular:  Normal flow voids. Skull and upper cervical spine: Normal marrow signal. Sinuses/Orbits: Left maxillary sinus mucosal thickening. Partial opacification of right mastoid air cells. Debris within nasopharynx likely due to intubation. Orbits are unremarkable. Other: None. IMPRESSION: Diffuse patchy acute/early subacute infarction of the cerebellum, right cerebral hemisphere, left occipital lobe, and right greater than left basal ganglia. Punctate foci are present in the brainstem as well as elsewhere throughout the left cerebral hemisphere. No associated hemorrhage. Mild local mass effect. Diffuse involvement of the brain favors hypoxic ischemic injury, embolic phenomenon, or possibly vasculitis. These results will be called to the ordering clinician or representative by the Radiologist Assistant, and communication documented in the PACS or zVision Dashboard. Electronically Signed   By: Kristine Garbe M.D.   On: 03/11/2019 00:34   Ct Abdomen Pelvis W Contrast  Result Date: 02/09/2019 CLINICAL DATA:  Abdominal pain anteriorly EXAM: CT ABDOMEN AND PELVIS WITH CONTRAST TECHNIQUE: Multidetector CT imaging of the abdomen and pelvis was performed using the standard protocol following bolus administration of intravenous contrast. CONTRAST:  59mL ISOVUE-300 IOPAMIDOL (ISOVUE-300) INJECTION 61% COMPARISON:  02/05/2019, 12/03/2018, 04/20/2018 FINDINGS: Lower chest: Bilateral interstitial and alveolar airspace disease in the right middle lobe, right lower lobe, lingula and left lower lobe. Bilateral small pleural effusions. Differential considerations include unresolved pneumonia including atypical pneumonia versus an element of pulmonary edema. New small pericardial effusion. Hepatobiliary: No focal liver abnormality is seen. Status post cholecystectomy. No biliary dilatation. Pancreas: Unremarkable. No pancreatic ductal dilatation or surrounding inflammatory changes. Spleen: Normal spleen size. 17 mm hypodense  splenic mass unchanged compared with multiple prior examinations. Adrenals/Urinary Tract: Adrenal glands are unremarkable. Kidneys are normal, without renal calculi, focal lesion, or hydronephrosis. Bladder is unremarkable. Bilateral nonspecific perinephric stranding. Stomach/Bowel: Stomach is within normal limits. Appendix appears normal. No evidence of bowel wall thickening, distention, or inflammatory changes. Vascular/Lymphatic: Normal caliber abdominal aorta with mild atherosclerosis. No lymphadenopathy. Reproductive: Prior hysterectomy. 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 Other: No fluid collection or hematoma. Small amount of pelvic free fluid. Small amount of pelvic free fluid. Interval worsening anasarca. Musculoskeletal: No acute osseous abnormality. No aggressive osseous  lesion. IMPRESSION: 1. No acute abdominal or pelvic pathology. 2. Interval worsening of anasarca.  Small volume pelvic free fluid. 3. Bilateral interstitial and alveolar airspace disease in the right middle lobe, right lower lobe, lingula and left lower lobe. Bilateral small pleural effusions. Differential considerations include unresolved pneumonia including atypical pneumonia versus an element of pulmonary edema. 4. New small pericardial effusion. Electronically Signed   By: Kathreen Devoid   On: 03/10/2019 12:46   Ir US Guide Vasc Access Right  Result Date: 02/24/2019 INDICATION: Occluded right forearm AV loop graft EXAM: Right forearm AV declot procedure thrombectomy thrombo lysis and 6 mm venous angioplasty MEDICATIONS: 3000 units heparin 2 tPA ANESTHESIA/SEDATION: Moderate Sedation Time:  35 minutes 2 mg Versed, 50 mcg fentanyl The patient was continuously monitored during the procedure by the interventional radiology nurse under my direct supervision. FLUOROSCOPY TIME:  Fluoroscopy Time: 4 minutes 20 seconds (9.0 mGy). COMPLICATIONS: None immediate. PROCEDURE: Informed written  consent was obtained from the patient after a thorough discussion of the procedural risks, benefits and alternatives. All questions were addressed. Maximal Sterile Barrier Technique was utilized including caps, mask, sterile gowns, sterile gloves, sterile drape, hand hygiene and skin antiseptic. A timeout was performed prior to the initiation of the procedure. Under sterile conditions and local anesthesia, ultrasound micropuncture access performed of the arterial side of the graft to sites. Images obtained for documentation of the venous access. Six French sheath inserted over Bentson guidewires. Catheter guidewire advanced across the venous outflow. Outflow venogram performed brachial vein the upper arm is patent. Pull-back venogram confirms thrombotic occlusion of outflow vein anastomosis at the elbow. 2 mg tPA instilled for thrombolysis. Mechanical thrombectomy performed with the Angiojet device throughout the entire loop graft utilizing both sheaths. Overlapping 6 mm angioplasty performed throughout the graft and at the venous anastomosis. Fogarty embolectomy performed of the arterial plug to re-established inflow. Both sheaths were back bled and syringe aspirated. Initial Shuntogram demonstrates restored flow and patency with low resistance flow through the graft. Residual intragraft nonocclusive thrombus noted and mild narrowing of the venous anastomosis. This responded to additional overlapping venous angioplasty. Reflux across the arterial anastomosis demonstrates patency of the arterial limb of the graft. Complete shuntogram demonstrates patency of the outflow upper arm veins. Centrally the axillary, subclavian and innominate veins are patent. SVC patent. No central stenosis or occlusion. Access removed. Hemostasis obtained with pursestring sutures at both sites. Patient tolerated the procedure well. No immediate complication IMPRESSION: Successful right forearm AV graft declot procedure requiring thrombo  lysis, thrombectomy and venous angioplasty to restore flow. ACCESS: This access remains amenable to future percutaneous interventions as clinically indicated. Electronically Signed   By: Jerilynn Mages.  Shick M.D.   On: 02/20/2019 16:06   Dg Chest Port 1 View  Result Date: 03/13/2019 CLINICAL DATA:  Acute respiratory failure. EXAM: PORTABLE CHEST 1 VIEW COMPARISON:  Chest x-ray from yesterday. FINDINGS: Slight advancement of the endotracheal tube with the tip now 4.3 cm above the carina. Unchanged enteric tube. Unchanged right internal jugular central venous and tunneled dialysis catheters. Stable cardiomegaly. Slightly lower lung volumes today with stable diffuse interstitial coarsening. Unchanged mild streaky opacity at the right lung base. No pleural effusion or pneumothorax. No acute osseous abnormality. IMPRESSION: 1. Slight advancement of the endotracheal tube, in good position. Otherwise stable lines and tubes. 2. Slightly decreased lung volumes with unchanged diffuse interstitial coarsening and right basilar atelectasis. Electronically Signed   By: Titus Dubin M.D.   On: 03/13/2019 07:00   Dg  Chest Port 1 View  Result Date: 03/12/2019 CLINICAL DATA:  Acute on chronic respiratory failure EXAM: PORTABLE CHEST 1 VIEW COMPARISON:  Two days ago FINDINGS: Endotracheal tube tip at the clavicular heads. Right IJ line and dialysis catheter with tips at the right atrium. The orogastric tube tip and side-port reaches the stomach. Stable cardiomegaly. Stable interstitial coarsening with mild streaky density at the right base. No evidence of effusion or pneumothorax. IMPRESSION: 1. Higher endotracheal tube with tip at T2. 2. No significant change in aeration.  Stable cardiomegaly. Electronically Signed   By: Monte Fantasia M.D.   On: 03/12/2019 07:18   Dg Chest Port 1 View  Result Date: 03/10/2019 CLINICAL DATA:  Respiratory failure. EXAM: PORTABLE CHEST 1 VIEW COMPARISON:  03/09/2019. FINDINGS: Endotracheal tube, NG  tube, right IJ line, and dual-lumen right IJ catheter stable position. Stable cardiomegaly. Continued slight improvement interstitial prominence. No pleural effusion noted on today's exam. IMPRESSION: 1.  Lines and tubes in stable position. 2. Stable cardiomegaly. Continued slight improvement in interstitial prominence. Electronically Signed   By: Marcello Moores  Register   On: 03/10/2019 06:35   Dg Chest Port 1 View  Result Date: 03/09/2019 CLINICAL DATA:  Respiratory failure. EXAM: PORTABLE CHEST 1 VIEW COMPARISON:  03/08/2019. FINDINGS: Endotracheal tube, NG tube, right IJ and right subclavian central line stable position. Stable cardiomegaly. Partial clearing of bilateral pulmonary interstitial prominence suggesting clearing interstitial edema. Tiny right pleural effusion cannot be excluded. No pneumothorax. IMPRESSION: 1.  Lines and tubes in stable position. 2. Stable cardiomegaly. Partial clearing of bilateral pulmonary interstitial prominence suggesting clearing interstitial edema. Tiny right pleural effusion cannot be excluded. Electronically Signed   By: Marcello Moores  Register   On: 03/09/2019 06:31   Dg Chest Port 1 View  Result Date: 03/08/2019 CLINICAL DATA:  Acute respiratory failure EXAM: PORTABLE CHEST 1 VIEW COMPARISON:  Chest x-rays dated 03/06/2019 and 03/05/2019 FINDINGS: Endotracheal tube is well positioned with tip just above the level of the carina. RIGHT IJ and RIGHT subclavian central catheter is appear well positioned with tips overlying the RIGHT atrium. Enteric tube passes below the diaphragm. Stable cardiomegaly. Stable mild bilateral interstitial prominence. Asymmetric opacities throughout the RIGHT lung, slightly more prominent than on yesterday's chest x-ray and similar to appearance on earlier exam of 03/05/2019, multifocal pneumonia versus asymmetric edema. No pleural effusion or pneumothorax seen. IMPRESSION: 1. Endotracheal tube well positioned with tip just above the level of the  carina. Additional support apparatus appears stable. 2. Stable cardiomegaly. 3. Asymmetric opacities throughout the RIGHT lung, similar to appearance on chest x-ray of 03/05/2019, multifocal pneumonia versus asymmetric edema. Electronically Signed   By: Franki Cabot M.D.   On: 03/08/2019 05:30   Portable Chest X-ray  Result Date: 03/06/2019 CLINICAL DATA:  Intubated. EXAM: PORTABLE CHEST 1 VIEW COMPARISON:  X-ray from the same day FINDINGS: The endotracheal tube terminates approximately 4.8 cm above the carina. There is a newly placed right-sided central venous catheter that appears well position near the distal SVC/cavoatrial junction. The tunneled dialysis catheter on the right is stable. The heart remains enlarged. Multifocal airspace opacities are again noted. Aortic calcifications are again seen. The previously noted lucency projecting over the left mediastinum has resolved. The enteric tube extends below the left hemidiaphragm. IMPRESSION: 1. Lines and tubes as above.  No pneumothorax. 2. Persistent multifocal airspace opacities. 3. Cardiomegaly. 4. Previously noted linear lucency overlying the left mediastinum has resolved and was likely artifact. Electronically Signed   By: Jamie Kato.D.  On: 03/06/2019 20:35   Dg Chest Port 1 View  Result Date: 03/06/2019 CLINICAL DATA:  Aspiration EXAM: PORTABLE CHEST 1 VIEW COMPARISON:  March 05, 2019 FINDINGS: The heart size is significantly enlarged. An endotracheal tube is not reliably visualized on this exam. There is a well-positioned tunneled dialysis catheter on the right. There are dense bilateral airspace opacities. There are small bilateral pleural effusions. There is no pneumothorax. The lung fields are partially obscured by an overlying being defibrillator pad. There is a thin linear density projecting over the left mediastinum. IMPRESSION: 1. Lines and tubes as above. 2. Persistent diffuse multifocal airspace opacities, likely not  significantly changed from prior CT chest. 3. Linear lucency projecting over the left mediastinum is favored to represent artifact, less likely pneumomediastinum. Attention on follow-up examinations versus a short interval repeat chest x-ray is recommended. Electronically Signed   By: Constance Holster M.D.   On: 03/06/2019 19:11   Dg Chest Portable 1 View  Result Date: 02/26/2019 CLINICAL DATA:  Chest pain EXAM: PORTABLE CHEST 1 VIEW COMPARISON:  02/18/2019 FINDINGS: Cardiomegaly and vascular pedicle widening. Diffuse interstitial coarsening. Dialysis catheter on the right with tip at the right atrium. Probable small pleural effusions. IMPRESSION: Cardiomegaly with pulmonary edema. Electronically Signed   By: Monte Fantasia M.D.   On: 03/01/2019 10:53   Dg Chest Port 1 View  Result Date: 02/18/2019 CLINICAL DATA:  Chest pain and shortness of breath EXAM: PORTABLE CHEST 1 VIEW COMPARISON:  02/05/2019 FINDINGS: Cardiac shadow remains enlarged. Aortic calcifications are again seen. Dialysis catheter is again noted on the right. Diffuse alveolar edema is noted similar to that seen on the prior exam. Some more focal scarring in right base is noted. No bony abnormality is noted. IMPRESSION: Changes consistent with CHF similar to that seen on the prior plain film. Electronically Signed   By: Inez Catalina M.D.   On: 02/18/2019 13:30   Dg Abd Portable 1v  Result Date: 03/11/2019 CLINICAL DATA:  NG tube placement EXAM: PORTABLE ABDOMEN - 1 VIEW COMPARISON:  03/06/2019 FINDINGS: Esophageal tube tip overlies the gastric body. Gas pattern is nonobstructed. Central venous catheter tips over the right atrium. IMPRESSION: 1. Esophageal tube tip overlies the gastric body. Electronically Signed   By: Donavan Foil M.D.   On: 03/11/2019 00:50   Dg Abd Portable 1v  Result Date: 03/06/2019 CLINICAL DATA:  OG tube placement EXAM: PORTABLE ABDOMEN - 1 VIEW COMPARISON:  None. FINDINGS: The OG tube projects over the  gastric body. The tube is coiled once. The bowel gas pattern is nonspecific and nonobstructive. Calcifications project over the patient's pelvis. IMPRESSION: OG tube projects over the patient's gastric body. Electronically Signed   By: Constance Holster M.D.   On: 03/06/2019 20:36   Ir Thrombectomy Av Fistula W/thrombolysis Inc/shunt/img Right  Result Date: 02/20/2019 INDICATION: Occluded right forearm AV loop graft EXAM: Right forearm AV declot procedure thrombectomy thrombo lysis and 6 mm venous angioplasty MEDICATIONS: 3000 units heparin 2 tPA ANESTHESIA/SEDATION: Moderate Sedation Time:  35 minutes 2 mg Versed, 50 mcg fentanyl The patient was continuously monitored during the procedure by the interventional radiology nurse under my direct supervision. FLUOROSCOPY TIME:  Fluoroscopy Time: 4 minutes 20 seconds (9.0 mGy). COMPLICATIONS: None immediate. PROCEDURE: Informed written consent was obtained from the patient after a thorough discussion of the procedural risks, benefits and alternatives. All questions were addressed. Maximal Sterile Barrier Technique was utilized including caps, mask, sterile gowns, sterile gloves, sterile drape, hand hygiene and skin antiseptic.  A timeout was performed prior to the initiation of the procedure. Under sterile conditions and local anesthesia, ultrasound micropuncture access performed of the arterial side of the graft to sites. Images obtained for documentation of the venous access. Six French sheath inserted over Bentson guidewires. Catheter guidewire advanced across the venous outflow. Outflow venogram performed brachial vein the upper arm is patent. Pull-back venogram confirms thrombotic occlusion of outflow vein anastomosis at the elbow. 2 mg tPA instilled for thrombolysis. Mechanical thrombectomy performed with the Angiojet device throughout the entire loop graft utilizing both sheaths. Overlapping 6 mm angioplasty performed throughout the graft and at the venous  anastomosis. Fogarty embolectomy performed of the arterial plug to re-established inflow. Both sheaths were back bled and syringe aspirated. Initial Shuntogram demonstrates restored flow and patency with low resistance flow through the graft. Residual intragraft nonocclusive thrombus noted and mild narrowing of the venous anastomosis. This responded to additional overlapping venous angioplasty. Reflux across the arterial anastomosis demonstrates patency of the arterial limb of the graft. Complete shuntogram demonstrates patency of the outflow upper arm veins. Centrally the axillary, subclavian and innominate veins are patent. SVC patent. No central stenosis or occlusion. Access removed. Hemostasis obtained with pursestring sutures at both sites. Patient tolerated the procedure well. No immediate complication IMPRESSION: Successful right forearm AV graft declot procedure requiring thrombo lysis, thrombectomy and venous angioplasty to restore flow. ACCESS: This access remains amenable to future percutaneous interventions as clinically indicated. Electronically Signed   By: Jerilynn Mages.  Shick M.D.   On: 02/20/2019 16:06     Discharge Exam/VITALS  Vitals:   03/15/19 0515 03/15/19 1611  BP: (!) 79/44 (!) 94/48  Pulse: 79 82  Resp: 10 12  Temp: 99.5 F (37.5 C)   SpO2: (!) 70% (!) 65%   Vitals:   03/14/19 1357 03/15/19 0515 03/15/19 1611 Mar 22, 2019 0533  BP: (!) 97/56 (!) 79/44 (!) 94/48   Pulse: (!) 105 79 82   Resp: (!) 21 10 12    Temp:  99.5 F (37.5 C)    TempSrc:  Oral    SpO2: (!) 81% (!) 70% (!) 65%   Weight:    56.1 kg  Height:    5\' 1"  (1.549 m)    NOT EXAMINED BY ME AT THE TIME OF DEATH  The results of significant diagnostics from this hospitalization (including imaging, microbiology, ancillary and laboratory) are listed below for reference.     Microbiology: No results found for this or any previous visit (from the past 240 hour(s)).   Labs: BNP (last 3 results) Recent Labs     06/20/18 2042 12/03/18 1209 02/03/19 1400  BNP >4,500.0* >4,500.0* >6,734.1*   Basic Metabolic Panel: Recent Labs  Lab 03/11/19 0402 03/11/19 0420 03/12/19 0448 03/13/19 0344  NA 133*  --  137 136  K 3.9  --  3.7 3.9  CL 91*  --  97* 95*  CO2 24  --  25 24  GLUCOSE 124*  --  182* 177*  BUN 101*  --  59* 86*  CREATININE 7.04*  --  4.71* 6.09*  CALCIUM 9.0  --  8.4* 9.2  MG  --  2.8* 2.4  --   PHOS  --   --  4.3  --    Liver Function Tests: Recent Labs  Lab 03/12/19 0448  AST 28  ALT 23  ALKPHOS 71  BILITOT 0.2*  PROT 6.6  ALBUMIN 1.8*   No results for input(s): LIPASE, AMYLASE in the last 168 hours. Recent  Labs  Lab 03/12/19 0448  AMMONIA 20   CBC: Recent Labs  Lab 03/11/19 0402 03/12/19 0448 03/13/19 0344  WBC 16.8* 11.7* 13.4*  HGB 9.3* 8.0* 9.1*  HCT 30.2* 25.8* 29.2*  MCV 72.9* 73.3* 73.4*  PLT 356 314 399   Cardiac Enzymes: No results for input(s): CKTOTAL, CKMB, CKMBINDEX, TROPONINI in the last 168 hours. BNP: Invalid input(s): POCBNP CBG: Recent Labs  Lab 03/13/19 0029 03/13/19 0435 03/13/19 0754 03/13/19 1103 03/13/19 1525  GLUCAP 174* 180* 131* 127* 102*   D-Dimer No results for input(s): DDIMER in the last 72 hours. Hgb A1c No results for input(s): HGBA1C in the last 72 hours. Lipid Profile No results for input(s): CHOL, HDL, LDLCALC, TRIG, CHOLHDL, LDLDIRECT in the last 72 hours. Thyroid function studies No results for input(s): TSH, T4TOTAL, T3FREE, THYROIDAB in the last 72 hours.  Invalid input(s): FREET3 Anemia work up No results for input(s): VITAMINB12, FOLATE, FERRITIN, TIBC, IRON, RETICCTPCT in the last 72 hours. Urinalysis    Component Value Date/Time   COLORURINE YELLOW 12/03/2018 1208   APPEARANCEUR HAZY (A) 12/03/2018 1208   LABSPEC 1.020 12/03/2018 1208   PHURINE 6.0 12/03/2018 1208   GLUCOSEU 100 (A) 12/03/2018 1208   HGBUR LARGE (A) 12/03/2018 1208   HGBUR trace-intact 03/08/2010 1452   BILIRUBINUR  NEGATIVE 12/03/2018 [place] 12/03/2018 1208   PROTEINUR >300 (A) 12/03/2018 1208   UROBILINOGEN 0.2 03/08/2010 1452   NITRITE NEGATIVE 12/03/2018 1208   LEUKOCYTESUR TRACE (A) 12/03/2018 1208   Sepsis Labs Invalid input(s): PROCALCITONIN,  WBC,  LACTICIDVEN Microbiology No results found for this or any previous visit (from the past 240 hour(s)).  SIGNED:  Antonieta Pert, MD  [facility] 03/17/2019, 3:01 PM  If 7PM-7AM, please contact night-coverage [email or web address]

## 2019-04-11 NOTE — Progress Notes (Signed)
Called back again pt's sons Berneta Sages and Haely Leyland but no answer, left voicemail to both sons to return call at [phone].  Patient's body still inside room awaiting return call from pt's sons.  Will endorse to day shift RN appropriately.

## 2019-04-11 DEATH — deceased

## 2019-06-09 IMAGING — DX PORTABLE CHEST - 1 VIEW
1 series · 1 of 1 positions shown · non-contrast
Comparison: 12/08/2010

CLINICAL DATA: Pulmonary edema

EXAM:
PORTABLE CHEST 1 VIEW

[chest ap]
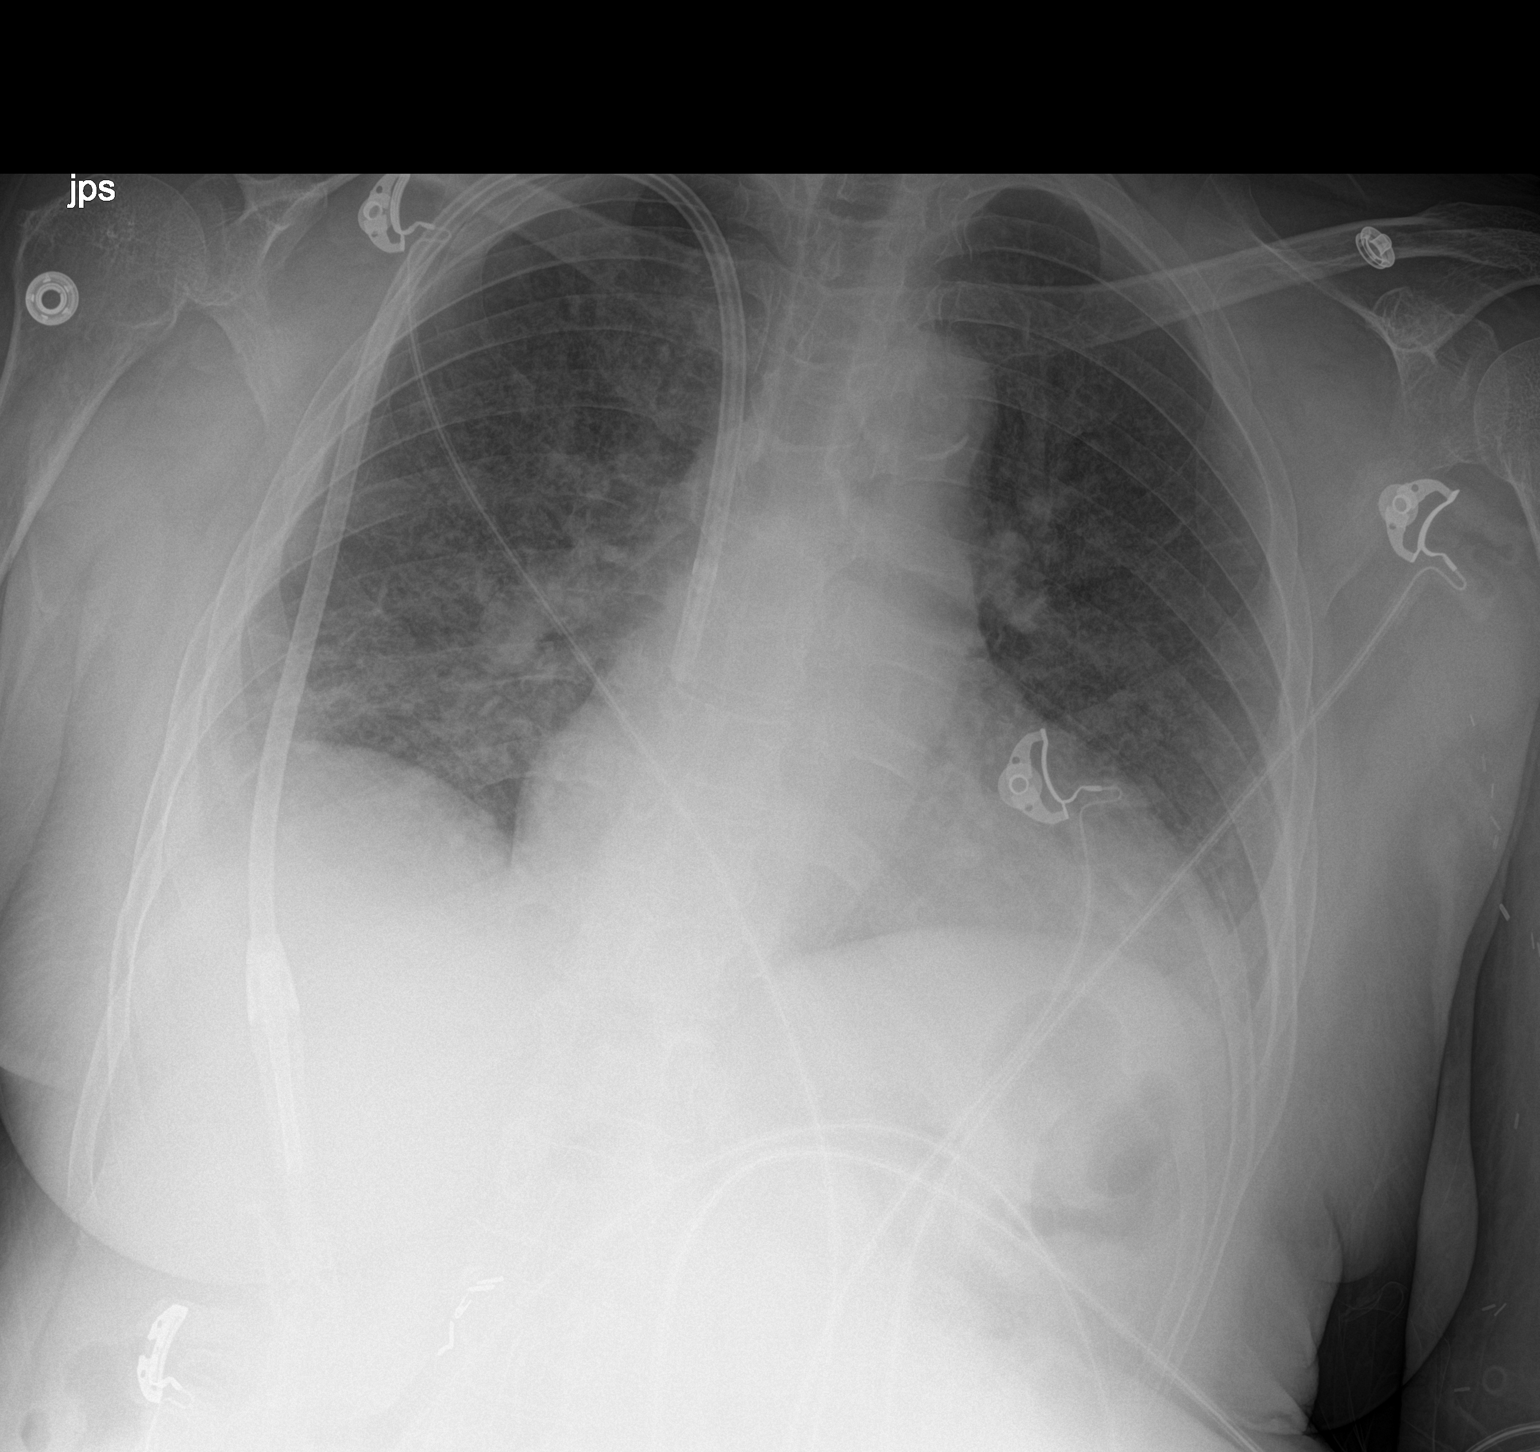

[1 of 1 positions shown; findings below may reference images not displayed]

FINDINGS: Cardiac shadow remains enlarged. Aortic calcifications are again
seen. Dialysis catheter is again noted. Lungs are well aerated
bilaterally with diffuse interstitial changes stable from the prior
exam. Some slight improved aeration in the right base is noted. No
new focal abnormality is noted.
IMPRESSION: Minimal improvement in the right base when compared with the prior
exam.

## 2019-06-28 IMAGING — DX ABDOMEN - 1 VIEW
2 series · 2 of 2 positions shown · non-contrast
Comparison: CT abdomen 12/03/2018

CLINICAL DATA: Abdominal pain

EXAM:
ABDOMEN - 1 VIEW

[abdomen kub (1 of 2)]
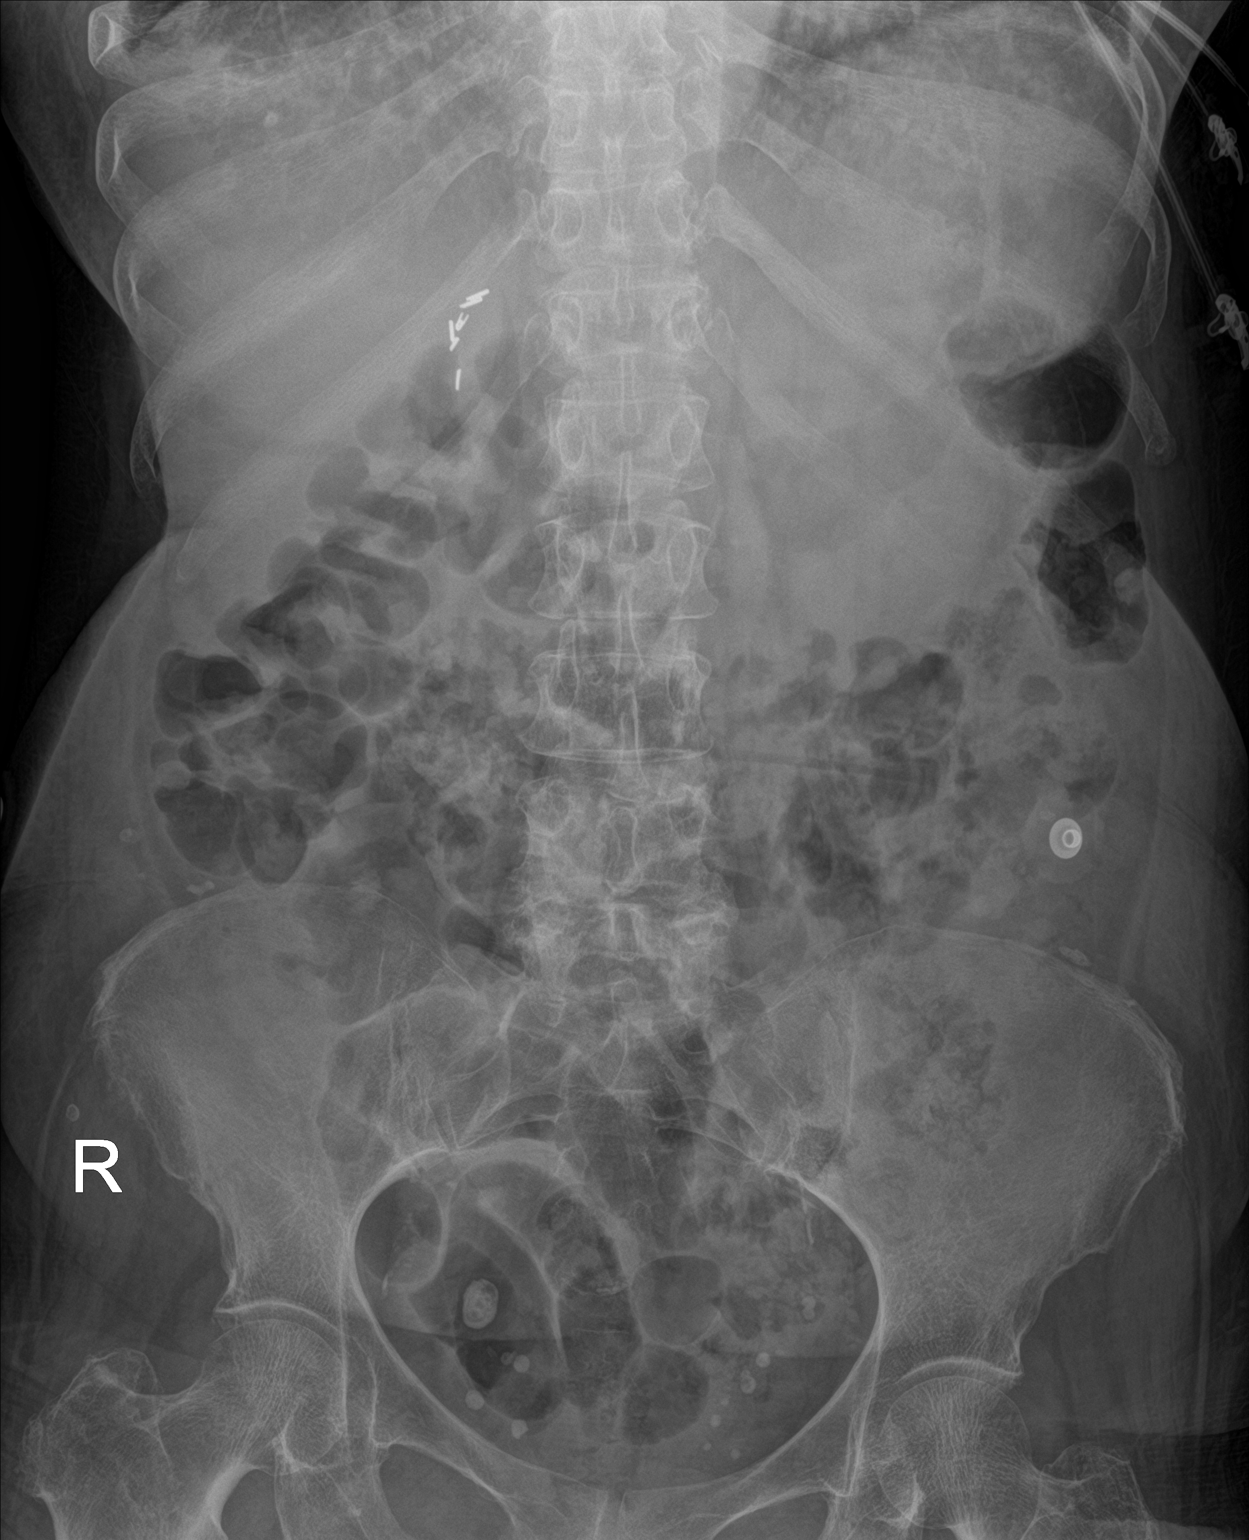

[abdomen kub (2 of 2)]
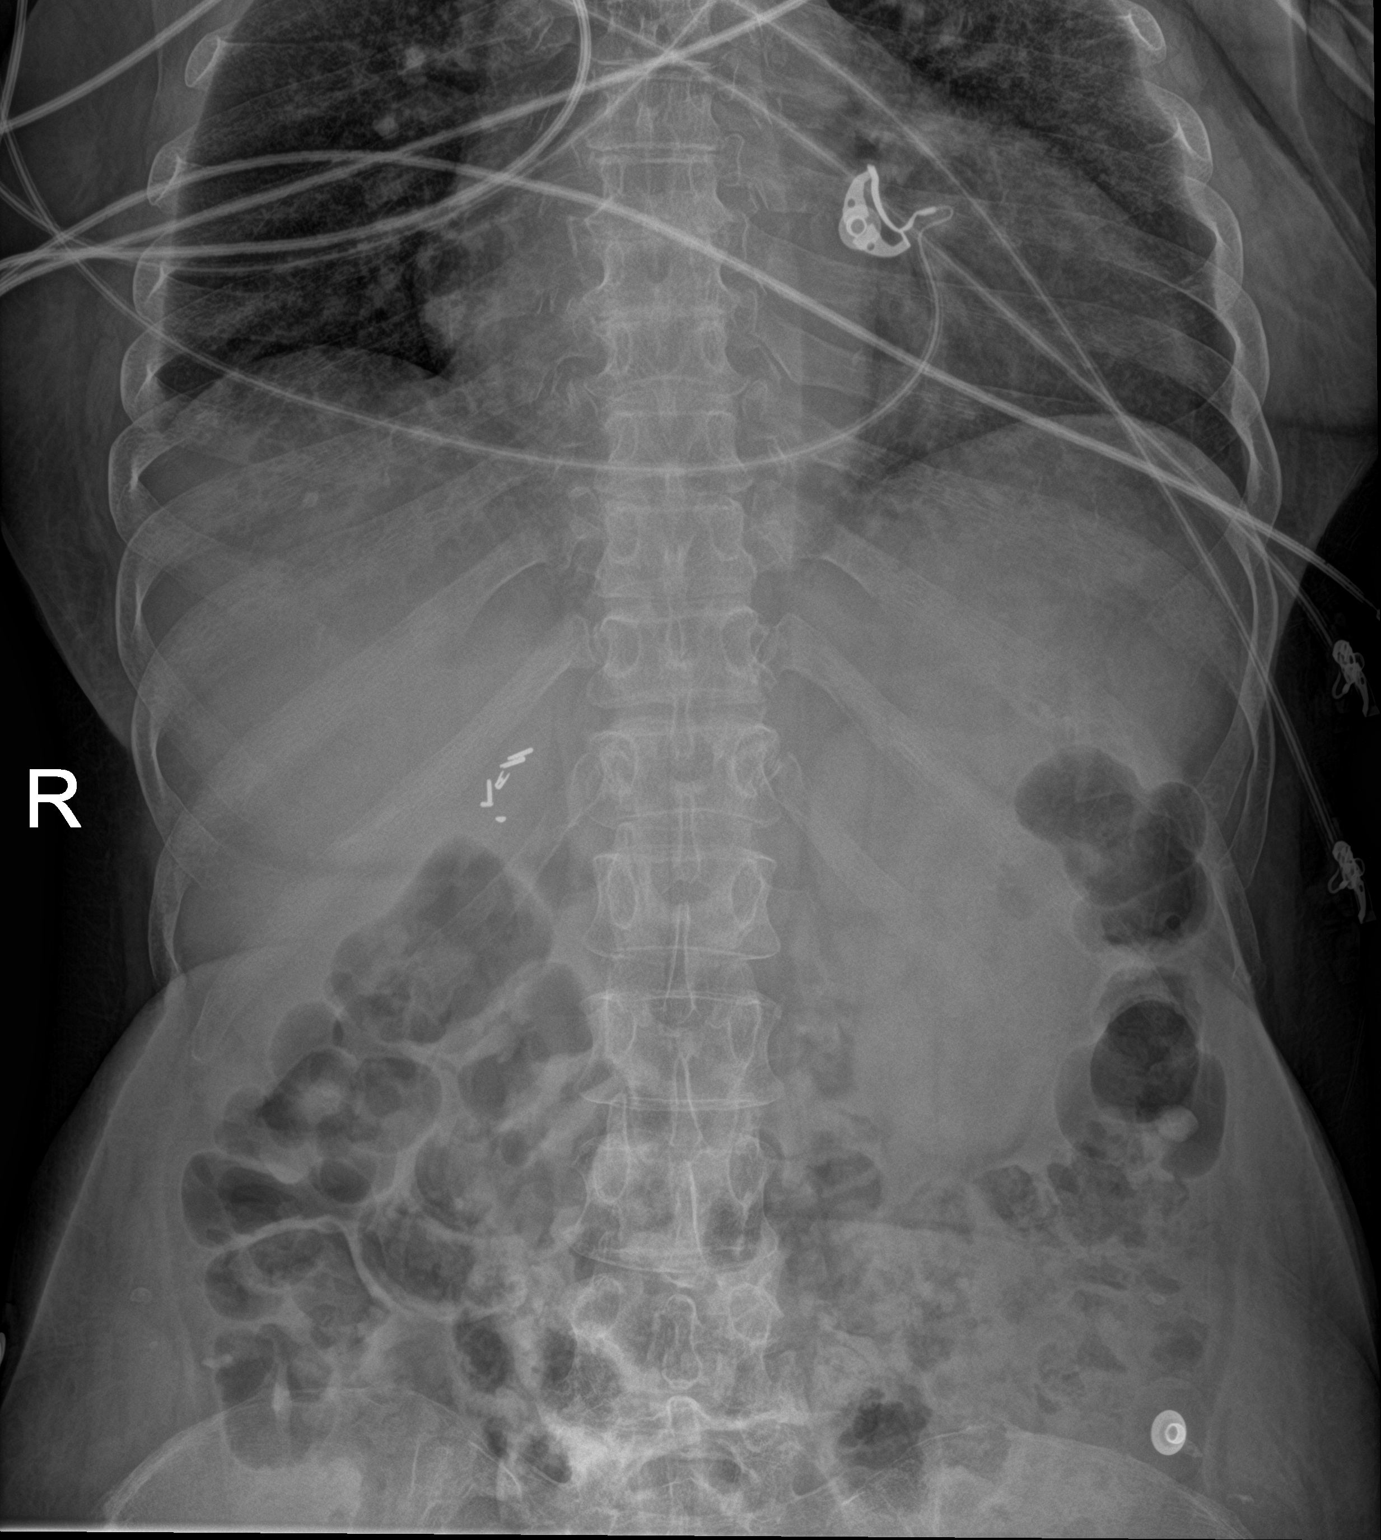

[2 of 2 positions shown; findings below may reference images not displayed]

FINDINGS: There is a large amount of stool throughout the colon. There is no
bowel dilatation to suggest obstruction. There is no evidence of
pneumoperitoneum, portal venous gas or pneumatosis.

There are no pathologic calcifications along the expected course of
the ureters.

The osseous structures are unremarkable.
IMPRESSION: Negative.

## 2019-07-13 IMAGING — CT CT ANGIOGRAPHY CHEST
1 of 6 series · 3 of 16 positions shown · IV contrast (omnipaque)
Comparison: 12/03/2018

CLINICAL DATA: Positive D-dimer. Intermediate probability for
pulmonary embolism

EXAM:
CT ANGIOGRAPHY CHEST WITH CONTRAST
TECHNIQUE: Multidetector CT imaging of the chest was performed using the
standard protocol during bolus administration of intravenous
contrast. Multiplanar CT image reconstructions and MIPs were
obtained to evaluate the vascular anatomy.
CONTRAST:  50mL OMNIPAQUE IOHEXOL 350 MG/ML SOLN

[Series 7: pe thins · axial · 0.64mm/px · z∈[+905,+1018]mm · 3 of 326 slices shown]
[im 82/326  lung]
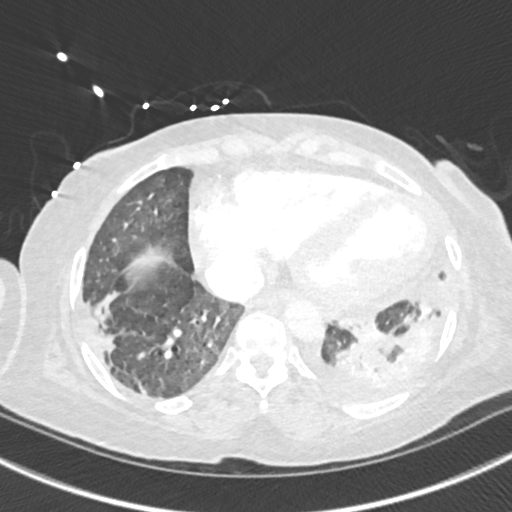
[im 163/326  soft-tissue]
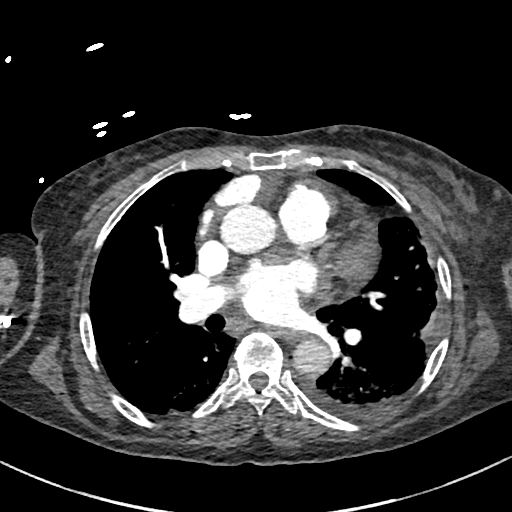
[im 244/326  lung]
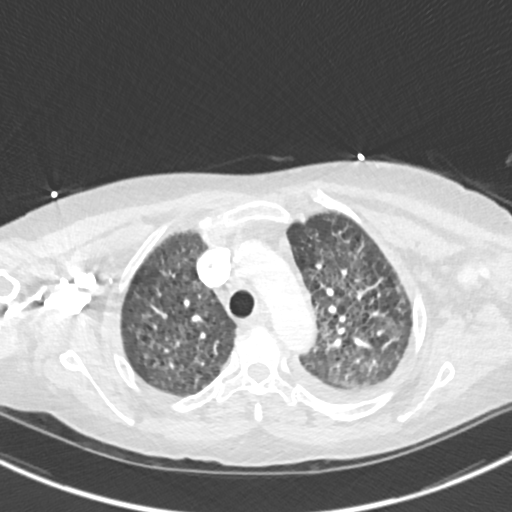

[3 of 16 positions shown; findings below may reference images not displayed]

FINDINGS: Cardiovascular: Cardiomegaly without pericardial effusion. Mild
atherosclerotic calcification of the aorta. Satisfactory
opacification of the pulmonary arteries. No typical acute pulmonary
embolism is seen. There is a web in the left lower lobe pulmonary
artery and irregular filling defect with under filling of downstream
vessels and is segmental left lower lobe branch. These were seen on
immediate prior. There is also blunting of anterior basal segment
pulmonary arteries in the right lower lobe. Questionable posterior
segment right upper lobe eccentric filling defect

Mediastinum/Nodes: Negative for adenopathy or mass

Lungs/Pleura: Small left pleural effusion with adjacent pulmonary
opacification and volume loss. Airspace disease seen previously has
significantly improved with mild generalized reticular appearance.
There is mild centrilobular emphysema.

Upper Abdomen: Negative

Musculoskeletal: No acute finding.  Thoracic disc degeneration

Review of the MIP images confirms the above findings.
IMPRESSION: 1. Negative for acute pulmonary embolism. There is evidence of
remote pulmonary emboli with web formation.
2. Improved aeration since 12/03/2018.
[DATE]. Small left pleural effusion. There is lower lobe atelectasis
and/or scarring.
